# Patient Record
Sex: Male | Born: 1940 | Race: White | Hispanic: No | Marital: Married | Smoking: Never smoker
Health system: Southern US, Community
[De-identification: ages and names within clinical notes are randomized; demographics above are authoritative.]

## PROBLEM LIST (undated history)

## (undated) DIAGNOSIS — E782 Mixed hyperlipidemia: Secondary | ICD-10-CM

## (undated) DIAGNOSIS — R197 Diarrhea, unspecified: Secondary | ICD-10-CM

## (undated) DIAGNOSIS — E119 Type 2 diabetes mellitus without complications: Secondary | ICD-10-CM

## (undated) DIAGNOSIS — I639 Cerebral infarction, unspecified: Secondary | ICD-10-CM

## (undated) DIAGNOSIS — R233 Spontaneous ecchymoses: Secondary | ICD-10-CM

## (undated) DIAGNOSIS — H02409 Unspecified ptosis of unspecified eyelid: Secondary | ICD-10-CM

## (undated) DIAGNOSIS — Z8709 Personal history of other diseases of the respiratory system: Secondary | ICD-10-CM

## (undated) DIAGNOSIS — F40240 Claustrophobia: Secondary | ICD-10-CM

## (undated) DIAGNOSIS — K5790 Diverticulosis of intestine, part unspecified, without perforation or abscess without bleeding: Secondary | ICD-10-CM

## (undated) DIAGNOSIS — M1990 Unspecified osteoarthritis, unspecified site: Secondary | ICD-10-CM

## (undated) DIAGNOSIS — G8929 Other chronic pain: Secondary | ICD-10-CM

## (undated) DIAGNOSIS — Z9289 Personal history of other medical treatment: Secondary | ICD-10-CM

## (undated) DIAGNOSIS — K5730 Diverticulosis of large intestine without perforation or abscess without bleeding: Secondary | ICD-10-CM

## (undated) DIAGNOSIS — C9210 Chronic myeloid leukemia, BCR/ABL-positive, not having achieved remission: Secondary | ICD-10-CM

## (undated) DIAGNOSIS — R238 Other skin changes: Secondary | ICD-10-CM

## (undated) DIAGNOSIS — D497 Neoplasm of unspecified behavior of endocrine glands and other parts of nervous system: Secondary | ICD-10-CM

## (undated) DIAGNOSIS — Z87442 Personal history of urinary calculi: Secondary | ICD-10-CM

## (undated) DIAGNOSIS — R609 Edema, unspecified: Secondary | ICD-10-CM

## (undated) DIAGNOSIS — I2699 Other pulmonary embolism without acute cor pulmonale: Secondary | ICD-10-CM

## (undated) DIAGNOSIS — H409 Unspecified glaucoma: Secondary | ICD-10-CM

## (undated) DIAGNOSIS — K317 Polyp of stomach and duodenum: Secondary | ICD-10-CM

## (undated) DIAGNOSIS — R600 Localized edema: Secondary | ICD-10-CM

## (undated) DIAGNOSIS — Z860100 Personal history of colon polyps, unspecified: Secondary | ICD-10-CM

## (undated) DIAGNOSIS — M549 Dorsalgia, unspecified: Secondary | ICD-10-CM

## (undated) DIAGNOSIS — G4700 Insomnia, unspecified: Secondary | ICD-10-CM

## (undated) DIAGNOSIS — R112 Nausea with vomiting, unspecified: Secondary | ICD-10-CM

## (undated) DIAGNOSIS — I2510 Atherosclerotic heart disease of native coronary artery without angina pectoris: Secondary | ICD-10-CM

## (undated) DIAGNOSIS — I739 Peripheral vascular disease, unspecified: Secondary | ICD-10-CM

## (undated) DIAGNOSIS — K219 Gastro-esophageal reflux disease without esophagitis: Secondary | ICD-10-CM

## (undated) DIAGNOSIS — I779 Disorder of arteries and arterioles, unspecified: Secondary | ICD-10-CM

## (undated) DIAGNOSIS — G7000 Myasthenia gravis without (acute) exacerbation: Secondary | ICD-10-CM

## (undated) DIAGNOSIS — N189 Chronic kidney disease, unspecified: Secondary | ICD-10-CM

## (undated) DIAGNOSIS — Z9889 Other specified postprocedural states: Secondary | ICD-10-CM

## (undated) DIAGNOSIS — I82409 Acute embolism and thrombosis of unspecified deep veins of unspecified lower extremity: Secondary | ICD-10-CM

## (undated) DIAGNOSIS — K222 Esophageal obstruction: Secondary | ICD-10-CM

## (undated) DIAGNOSIS — I10 Essential (primary) hypertension: Secondary | ICD-10-CM

## (undated) DIAGNOSIS — J189 Pneumonia, unspecified organism: Secondary | ICD-10-CM

## (undated) DIAGNOSIS — M810 Age-related osteoporosis without current pathological fracture: Secondary | ICD-10-CM

## (undated) HISTORY — PX: COLONOSCOPY: SHX174

## (undated) HISTORY — DX: Chronic myeloid leukemia, BCR/ABL-positive, not having achieved remission: C92.10

## (undated) HISTORY — PX: BACK SURGERY: SHX140

## (undated) HISTORY — DX: Acute embolism and thrombosis of unspecified deep veins of unspecified lower extremity: I82.409

## (undated) HISTORY — PX: TONSILLECTOMY: SUR1361

## (undated) HISTORY — PX: LITHOTRIPSY: SUR834

## (undated) HISTORY — DX: Diverticulosis of large intestine without perforation or abscess without bleeding: K57.30

## (undated) HISTORY — DX: Type 2 diabetes mellitus without complications: E11.9

## (undated) HISTORY — DX: Age-related osteoporosis without current pathological fracture: M81.0

## (undated) HISTORY — DX: Mixed hyperlipidemia: E78.2

## (undated) HISTORY — DX: Atherosclerotic heart disease of native coronary artery without angina pectoris: I25.10

## (undated) HISTORY — DX: Cerebral infarction, unspecified: I63.9

## (undated) HISTORY — PX: CARDIAC CATHETERIZATION: SHX172

## (undated) HISTORY — DX: Unspecified osteoarthritis, unspecified site: M19.90

## (undated) HISTORY — DX: Myasthenia gravis without (acute) exacerbation: G70.00

## (undated) HISTORY — DX: Unspecified ptosis of unspecified eyelid: H02.409

## (undated) HISTORY — DX: Gastro-esophageal reflux disease without esophagitis: K21.9

---

## 1997-03-01 ENCOUNTER — Encounter: Payer: Self-pay | Admitting: Gastroenterology

## 1997-11-19 ENCOUNTER — Encounter: Payer: Self-pay | Admitting: Emergency Medicine

## 1997-11-19 ENCOUNTER — Emergency Department (HOSPITAL_COMMUNITY): Admission: EM | Admit: 1997-11-19 | Discharge: 1997-11-19 | Payer: Self-pay | Admitting: Emergency Medicine

## 1999-05-16 ENCOUNTER — Encounter: Payer: Self-pay | Admitting: Endocrinology

## 1999-05-16 ENCOUNTER — Encounter: Admission: RE | Admit: 1999-05-16 | Discharge: 1999-05-16 | Payer: Self-pay | Admitting: Endocrinology

## 2000-10-14 ENCOUNTER — Encounter: Payer: Self-pay | Admitting: Cardiology

## 2000-10-14 ENCOUNTER — Ambulatory Visit (HOSPITAL_COMMUNITY): Admission: RE | Admit: 2000-10-14 | Discharge: 2000-10-14 | Payer: Self-pay | Admitting: Cardiology

## 2001-03-16 ENCOUNTER — Inpatient Hospital Stay (HOSPITAL_COMMUNITY): Admission: EM | Admit: 2001-03-16 | Discharge: 2001-03-19 | Payer: Self-pay | Admitting: Cardiology

## 2001-03-16 ENCOUNTER — Encounter: Payer: Self-pay | Admitting: *Deleted

## 2003-03-28 ENCOUNTER — Encounter: Payer: Self-pay | Admitting: Gastroenterology

## 2003-04-13 ENCOUNTER — Encounter: Admission: RE | Admit: 2003-04-13 | Discharge: 2003-04-13 | Payer: Self-pay | Admitting: Endocrinology

## 2003-07-20 ENCOUNTER — Encounter: Admission: RE | Admit: 2003-07-20 | Discharge: 2003-07-20 | Payer: Self-pay | Admitting: Internal Medicine

## 2003-10-26 ENCOUNTER — Ambulatory Visit (HOSPITAL_COMMUNITY): Admission: RE | Admit: 2003-10-26 | Discharge: 2003-10-26 | Payer: Self-pay | Admitting: Gastroenterology

## 2003-10-26 ENCOUNTER — Encounter: Payer: Self-pay | Admitting: Gastroenterology

## 2003-12-18 ENCOUNTER — Ambulatory Visit: Payer: Self-pay | Admitting: Cardiology

## 2004-01-21 ENCOUNTER — Encounter: Admission: RE | Admit: 2004-01-21 | Discharge: 2004-01-21 | Payer: Self-pay | Admitting: Endocrinology

## 2004-01-23 ENCOUNTER — Ambulatory Visit: Payer: Self-pay | Admitting: Internal Medicine

## 2004-02-05 ENCOUNTER — Ambulatory Visit: Payer: Self-pay | Admitting: Endocrinology

## 2004-03-31 ENCOUNTER — Ambulatory Visit: Payer: Self-pay | Admitting: Oncology

## 2004-06-27 ENCOUNTER — Ambulatory Visit: Payer: Self-pay | Admitting: Oncology

## 2004-07-09 ENCOUNTER — Ambulatory Visit: Payer: Self-pay | Admitting: Endocrinology

## 2004-07-11 ENCOUNTER — Ambulatory Visit: Payer: Self-pay | Admitting: Cardiology

## 2004-07-16 ENCOUNTER — Ambulatory Visit: Payer: Self-pay | Admitting: Endocrinology

## 2004-07-25 ENCOUNTER — Ambulatory Visit: Payer: Self-pay | Admitting: Cardiology

## 2004-08-14 ENCOUNTER — Ambulatory Visit: Payer: Self-pay | Admitting: Cardiology

## 2004-10-02 ENCOUNTER — Ambulatory Visit: Payer: Self-pay | Admitting: Oncology

## 2004-10-03 ENCOUNTER — Ambulatory Visit: Payer: Self-pay | Admitting: Cardiology

## 2004-11-15 ENCOUNTER — Emergency Department (HOSPITAL_COMMUNITY): Admission: EM | Admit: 2004-11-15 | Discharge: 2004-11-16 | Payer: Self-pay | Admitting: Emergency Medicine

## 2004-12-04 ENCOUNTER — Ambulatory Visit: Payer: Self-pay | Admitting: Oncology

## 2004-12-05 ENCOUNTER — Ambulatory Visit: Payer: Self-pay | Admitting: Cardiology

## 2004-12-29 ENCOUNTER — Ambulatory Visit: Payer: Self-pay | Admitting: Cardiology

## 2005-02-04 ENCOUNTER — Ambulatory Visit: Payer: Self-pay | Admitting: *Deleted

## 2005-02-25 ENCOUNTER — Ambulatory Visit: Payer: Self-pay | Admitting: Oncology

## 2005-04-01 ENCOUNTER — Ambulatory Visit: Payer: Self-pay | Admitting: Cardiology

## 2005-04-23 ENCOUNTER — Ambulatory Visit: Payer: Self-pay | Admitting: Oncology

## 2005-06-01 ENCOUNTER — Ambulatory Visit: Payer: Self-pay | Admitting: Endocrinology

## 2005-06-02 ENCOUNTER — Ambulatory Visit: Payer: Self-pay | Admitting: Gastroenterology

## 2005-06-15 ENCOUNTER — Ambulatory Visit: Payer: Self-pay | Admitting: Endocrinology

## 2005-06-15 ENCOUNTER — Ambulatory Visit: Payer: Self-pay | Admitting: Cardiology

## 2005-06-18 ENCOUNTER — Encounter (INDEPENDENT_AMBULATORY_CARE_PROVIDER_SITE_OTHER): Payer: Self-pay | Admitting: *Deleted

## 2005-06-18 ENCOUNTER — Ambulatory Visit (HOSPITAL_COMMUNITY): Admission: RE | Admit: 2005-06-18 | Discharge: 2005-06-18 | Payer: Self-pay | Admitting: Endocrinology

## 2005-06-24 ENCOUNTER — Ambulatory Visit: Payer: Self-pay | Admitting: Gastroenterology

## 2005-06-24 ENCOUNTER — Ambulatory Visit: Payer: Self-pay | Admitting: Cardiology

## 2005-07-08 ENCOUNTER — Ambulatory Visit (HOSPITAL_COMMUNITY): Admission: RE | Admit: 2005-07-08 | Discharge: 2005-07-08 | Payer: Self-pay | Admitting: Gastroenterology

## 2005-07-08 ENCOUNTER — Ambulatory Visit: Payer: Self-pay | Admitting: Cardiology

## 2005-07-21 ENCOUNTER — Ambulatory Visit: Payer: Self-pay | Admitting: Gastroenterology

## 2005-11-11 ENCOUNTER — Ambulatory Visit: Payer: Self-pay | Admitting: Oncology

## 2005-11-13 LAB — CBC WITH DIFFERENTIAL/PLATELET
BASO%: 0.5 % (ref 0.0–2.0)
EOS%: 3.3 % (ref 0.0–7.0)
HCT: 38.2 % — ABNORMAL LOW (ref 38.7–49.9)
LYMPH%: 18.9 % (ref 14.0–48.0)
MCH: 32.4 pg (ref 28.0–33.4)
MCHC: 34.6 g/dL (ref 32.0–35.9)
MONO%: 12.4 % (ref 0.0–13.0)
NEUT%: 64.9 % (ref 40.0–75.0)
Platelets: 280 10*3/uL (ref 145–400)

## 2005-11-13 LAB — COMPREHENSIVE METABOLIC PANEL
ALT: 27 U/L (ref 0–40)
AST: 28 U/L (ref 0–37)
Creatinine, Ser: 1.5 mg/dL (ref 0.40–1.50)
Total Bilirubin: 0.7 mg/dL (ref 0.3–1.2)

## 2005-12-07 ENCOUNTER — Ambulatory Visit: Payer: Self-pay | Admitting: Endocrinology

## 2005-12-29 ENCOUNTER — Ambulatory Visit: Payer: Self-pay | Admitting: Endocrinology

## 2006-01-15 ENCOUNTER — Ambulatory Visit: Payer: Self-pay | Admitting: Endocrinology

## 2006-01-18 ENCOUNTER — Ambulatory Visit: Payer: Self-pay | Admitting: Cardiology

## 2006-03-09 ENCOUNTER — Ambulatory Visit: Payer: Self-pay | Admitting: Oncology

## 2006-03-12 LAB — COMPREHENSIVE METABOLIC PANEL
ALT: 22 U/L (ref 0–53)
BUN: 13 mg/dL (ref 6–23)
CO2: 26 mEq/L (ref 19–32)
Creatinine, Ser: 1.51 mg/dL — ABNORMAL HIGH (ref 0.40–1.50)
Total Bilirubin: 0.7 mg/dL (ref 0.3–1.2)

## 2006-03-12 LAB — CBC WITH DIFFERENTIAL/PLATELET
BASO%: 0.4 % (ref 0.0–2.0)
Basophils Absolute: 0 10*3/uL (ref 0.0–0.1)
EOS%: 5.9 % (ref 0.0–7.0)
HCT: 36.2 % — ABNORMAL LOW (ref 38.7–49.9)
LYMPH%: 22.5 % (ref 14.0–48.0)
MCH: 31.7 pg (ref 28.0–33.4)
MCHC: 34.8 g/dL (ref 32.0–35.9)
MONO#: 0.6 10*3/uL (ref 0.1–0.9)
NEUT%: 56.6 % (ref 40.0–75.0)
Platelets: 253 10*3/uL (ref 145–400)

## 2006-06-29 ENCOUNTER — Emergency Department (HOSPITAL_COMMUNITY): Admission: EM | Admit: 2006-06-29 | Discharge: 2006-06-29 | Payer: Self-pay | Admitting: Emergency Medicine

## 2006-07-07 ENCOUNTER — Ambulatory Visit: Payer: Self-pay | Admitting: Oncology

## 2006-07-09 LAB — CBC WITH DIFFERENTIAL/PLATELET
Basophils Absolute: 0 10*3/uL (ref 0.0–0.1)
Eosinophils Absolute: 0.2 10*3/uL (ref 0.0–0.5)
HCT: 36.8 % — ABNORMAL LOW (ref 38.7–49.9)
HGB: 12.8 g/dL — ABNORMAL LOW (ref 13.0–17.1)
LYMPH%: 21.1 % (ref 14.0–48.0)
MCHC: 34.9 g/dL (ref 32.0–35.9)
MONO#: 0.5 10*3/uL (ref 0.1–0.9)
NEUT#: 2.6 10*3/uL (ref 1.5–6.5)
NEUT%: 59.9 % (ref 40.0–75.0)
Platelets: 256 10*3/uL (ref 145–400)
WBC: 4.3 10*3/uL (ref 4.0–10.0)

## 2006-07-12 ENCOUNTER — Ambulatory Visit: Payer: Self-pay | Admitting: Gastroenterology

## 2006-07-19 ENCOUNTER — Ambulatory Visit: Payer: Self-pay | Admitting: Cardiology

## 2006-08-10 ENCOUNTER — Ambulatory Visit: Payer: Self-pay | Admitting: Internal Medicine

## 2006-09-16 ENCOUNTER — Ambulatory Visit: Payer: Self-pay | Admitting: Oncology

## 2006-09-20 LAB — COMPREHENSIVE METABOLIC PANEL
ALT: 21 U/L (ref 0–53)
AST: 25 U/L (ref 0–37)
BUN: 14 mg/dL (ref 6–23)
Calcium: 9.5 mg/dL (ref 8.4–10.5)
Creatinine, Ser: 1.55 mg/dL — ABNORMAL HIGH (ref 0.40–1.50)
Total Bilirubin: 0.7 mg/dL (ref 0.3–1.2)

## 2006-09-20 LAB — CBC WITH DIFFERENTIAL/PLATELET
BASO%: 0.8 % (ref 0.0–2.0)
Basophils Absolute: 0 10*3/uL (ref 0.0–0.1)
EOS%: 4.8 % (ref 0.0–7.0)
HCT: 36.1 % — ABNORMAL LOW (ref 38.7–49.9)
HGB: 12.8 g/dL — ABNORMAL LOW (ref 13.0–17.1)
LYMPH%: 19.5 % (ref 14.0–48.0)
MCH: 31.9 pg (ref 28.0–33.4)
MCHC: 35.5 g/dL (ref 32.0–35.9)
MCV: 89.9 fL (ref 81.6–98.0)
NEUT%: 61.4 % (ref 40.0–75.0)
Platelets: 236 10*3/uL (ref 145–400)

## 2006-12-02 ENCOUNTER — Telehealth (INDEPENDENT_AMBULATORY_CARE_PROVIDER_SITE_OTHER): Payer: Self-pay | Admitting: *Deleted

## 2006-12-23 ENCOUNTER — Ambulatory Visit: Payer: Self-pay | Admitting: Oncology

## 2006-12-27 ENCOUNTER — Encounter: Payer: Self-pay | Admitting: Endocrinology

## 2006-12-27 LAB — CBC WITH DIFFERENTIAL/PLATELET
BASO%: 1.5 % (ref 0.0–2.0)
Basophils Absolute: 0.1 10*3/uL (ref 0.0–0.1)
HCT: 35.9 % — ABNORMAL LOW (ref 38.7–49.9)
HGB: 12.7 g/dL — ABNORMAL LOW (ref 13.0–17.1)
LYMPH%: 21.3 % (ref 14.0–48.0)
MCH: 32.1 pg (ref 28.0–33.4)
MCHC: 35.4 g/dL (ref 32.0–35.9)
MONO#: 0.6 10*3/uL (ref 0.1–0.9)
NEUT%: 56 % (ref 40.0–75.0)
Platelets: 229 10*3/uL (ref 145–400)
WBC: 4.2 10*3/uL (ref 4.0–10.0)
lymph#: 0.9 10*3/uL (ref 0.9–3.3)

## 2006-12-27 LAB — COMPREHENSIVE METABOLIC PANEL
BUN: 10 mg/dL (ref 6–23)
CO2: 27 mEq/L (ref 19–32)
Calcium: 9.4 mg/dL (ref 8.4–10.5)
Chloride: 106 mEq/L (ref 96–112)
Creatinine, Ser: 1.49 mg/dL (ref 0.40–1.50)
Glucose, Bld: 85 mg/dL (ref 70–99)
Total Bilirubin: 0.6 mg/dL (ref 0.3–1.2)

## 2007-01-17 ENCOUNTER — Ambulatory Visit: Payer: Self-pay | Admitting: Cardiology

## 2007-01-28 ENCOUNTER — Encounter: Payer: Self-pay | Admitting: Endocrinology

## 2007-02-01 ENCOUNTER — Ambulatory Visit: Payer: Self-pay | Admitting: Cardiology

## 2007-02-01 LAB — CONVERTED CEMR LAB
ALT: 25 units/L (ref 0–53)
AST: 25 units/L (ref 0–37)
Albumin: 3.8 g/dL (ref 3.5–5.2)
Alkaline Phosphatase: 36 units/L — ABNORMAL LOW (ref 39–117)
Bilirubin, Direct: 0.2 mg/dL (ref 0.0–0.3)
Cholesterol: 113 mg/dL (ref 0–200)
HDL: 25.2 mg/dL — ABNORMAL LOW (ref >39.0)
LDL Cholesterol: 67 mg/dL (ref 0–99)
Total Bilirubin: 0.9 mg/dL (ref 0.3–1.2)
Total CHOL/HDL Ratio: 4.5
Total Protein: 6.1 g/dL (ref 6.0–8.3)
Triglycerides: 102 mg/dL (ref 0–149)
VLDL: 20 mg/dL (ref 0–40)

## 2007-02-04 ENCOUNTER — Encounter: Payer: Self-pay | Admitting: Endocrinology

## 2007-03-18 ENCOUNTER — Encounter: Payer: Self-pay | Admitting: Endocrinology

## 2007-03-18 ENCOUNTER — Encounter: Payer: Self-pay | Admitting: Internal Medicine

## 2007-03-24 ENCOUNTER — Ambulatory Visit: Payer: Self-pay | Admitting: Oncology

## 2007-03-28 ENCOUNTER — Encounter: Payer: Self-pay | Admitting: Endocrinology

## 2007-03-28 LAB — CBC WITH DIFFERENTIAL/PLATELET
BASO%: 0.3 % (ref 0.0–2.0)
Eosinophils Absolute: 0.3 10*3/uL (ref 0.0–0.5)
HCT: 37.2 % — ABNORMAL LOW (ref 38.7–49.9)
LYMPH%: 12.6 % — ABNORMAL LOW (ref 14.0–48.0)
MCHC: 34.7 g/dL (ref 32.0–35.9)
MONO#: 0.8 10*3/uL (ref 0.1–0.9)
NEUT%: 71.7 % (ref 40.0–75.0)
Platelets: 244 10*3/uL (ref 145–400)
WBC: 7.3 10*3/uL (ref 4.0–10.0)

## 2007-05-13 ENCOUNTER — Encounter: Payer: Self-pay | Admitting: Endocrinology

## 2007-05-25 ENCOUNTER — Encounter: Payer: Self-pay | Admitting: Internal Medicine

## 2007-05-26 ENCOUNTER — Encounter: Payer: Self-pay | Admitting: Internal Medicine

## 2007-07-07 DIAGNOSIS — C9211 Chronic myeloid leukemia, BCR/ABL-positive, in remission: Secondary | ICD-10-CM

## 2007-07-07 DIAGNOSIS — Z87442 Personal history of urinary calculi: Secondary | ICD-10-CM

## 2007-07-07 DIAGNOSIS — I2510 Atherosclerotic heart disease of native coronary artery without angina pectoris: Secondary | ICD-10-CM

## 2007-07-14 ENCOUNTER — Ambulatory Visit: Payer: Self-pay | Admitting: Oncology

## 2007-07-18 ENCOUNTER — Encounter: Payer: Self-pay | Admitting: Endocrinology

## 2007-07-18 LAB — CBC WITH DIFFERENTIAL/PLATELET
Basophils Absolute: 0 10*3/uL (ref 0.0–0.1)
Eosinophils Absolute: 0.3 10*3/uL (ref 0.0–0.5)
HCT: 36 % — ABNORMAL LOW (ref 38.7–49.9)
HGB: 12.6 g/dL — ABNORMAL LOW (ref 13.0–17.1)
LYMPH%: 20.9 % (ref 14.0–48.0)
MONO#: 0.6 10*3/uL (ref 0.1–0.9)
NEUT#: 2.7 10*3/uL (ref 1.5–6.5)
Platelets: 275 10*3/uL (ref 145–400)
RBC: 3.96 10*6/uL — ABNORMAL LOW (ref 4.20–5.71)
WBC: 4.7 10*3/uL (ref 4.0–10.0)

## 2007-08-23 ENCOUNTER — Ambulatory Visit: Payer: Self-pay | Admitting: Cardiology

## 2007-08-23 LAB — CONVERTED CEMR LAB
ALT: 24 units/L (ref 0–53)
AST: 28 units/L (ref 0–37)
Albumin: 4.1 g/dL (ref 3.5–5.2)
Alkaline Phosphatase: 38 units/L — ABNORMAL LOW (ref 39–117)
BUN: 14 mg/dL (ref 6–23)
Bilirubin, Direct: 0.1 mg/dL (ref 0.0–0.3)
CO2: 28 meq/L (ref 19–32)
Calcium: 9.1 mg/dL (ref 8.4–10.5)
Chloride: 105 meq/L (ref 96–112)
Cholesterol: 112 mg/dL (ref 0–200)
Creatinine, Ser: 1.6 mg/dL — ABNORMAL HIGH (ref 0.4–1.5)
GFR calc Af Amer: 55 mL/min
GFR calc non Af Amer: 46 mL/min
Glucose, Bld: 104 mg/dL — ABNORMAL HIGH (ref 70–99)
HDL: 26.8 mg/dL — ABNORMAL LOW (ref >39.0)
LDL Cholesterol: 68 mg/dL (ref 0–99)
Potassium: 3.8 meq/L (ref 3.5–5.1)
Sodium: 139 meq/L (ref 135–145)
Total Bilirubin: 1.1 mg/dL (ref 0.3–1.2)
Total CHOL/HDL Ratio: 4.2
Total Protein: 6 g/dL (ref 6.0–8.3)
Triglycerides: 85 mg/dL (ref 0–149)
VLDL: 17 mg/dL (ref 0–40)

## 2007-08-25 ENCOUNTER — Ambulatory Visit: Payer: Self-pay | Admitting: Cardiology

## 2007-08-29 ENCOUNTER — Ambulatory Visit: Payer: Self-pay | Admitting: Endocrinology

## 2007-08-29 DIAGNOSIS — H109 Unspecified conjunctivitis: Secondary | ICD-10-CM | POA: Insufficient documentation

## 2007-09-23 ENCOUNTER — Encounter: Payer: Self-pay | Admitting: Endocrinology

## 2007-09-23 ENCOUNTER — Encounter: Payer: Self-pay | Admitting: Internal Medicine

## 2007-11-10 ENCOUNTER — Ambulatory Visit: Payer: Self-pay | Admitting: Oncology

## 2007-11-14 ENCOUNTER — Encounter: Payer: Self-pay | Admitting: Endocrinology

## 2007-11-14 LAB — CBC WITH DIFFERENTIAL/PLATELET
Eosinophils Absolute: 0.3 10*3/uL (ref 0.0–0.5)
HCT: 36.1 % — ABNORMAL LOW (ref 38.7–49.9)
LYMPH%: 16.2 % (ref 14.0–48.0)
MCV: 91.4 fL (ref 81.6–98.0)
MONO#: 0.6 10*3/uL (ref 0.1–0.9)
MONO%: 10.5 % (ref 0.0–13.0)
NEUT#: 3.6 10*3/uL (ref 1.5–6.5)
NEUT%: 67.6 % (ref 40.0–75.0)
Platelets: 194 10*3/uL (ref 145–400)
WBC: 5.4 10*3/uL (ref 4.0–10.0)

## 2007-11-14 LAB — COMPREHENSIVE METABOLIC PANEL
BUN: 8 mg/dL (ref 6–23)
CO2: 29 mEq/L (ref 19–32)
Creatinine, Ser: 1.24 mg/dL (ref 0.40–1.50)
Glucose, Bld: 143 mg/dL — ABNORMAL HIGH (ref 70–99)
Total Bilirubin: 0.7 mg/dL (ref 0.3–1.2)

## 2007-12-08 ENCOUNTER — Ambulatory Visit: Payer: Self-pay | Admitting: Endocrinology

## 2007-12-08 DIAGNOSIS — J189 Pneumonia, unspecified organism: Secondary | ICD-10-CM

## 2007-12-29 ENCOUNTER — Ambulatory Visit: Payer: Self-pay | Admitting: Endocrinology

## 2007-12-30 ENCOUNTER — Encounter: Payer: Self-pay | Admitting: Endocrinology

## 2008-02-07 ENCOUNTER — Ambulatory Visit: Payer: Self-pay | Admitting: Internal Medicine

## 2008-02-07 DIAGNOSIS — R21 Rash and other nonspecific skin eruption: Secondary | ICD-10-CM

## 2008-02-08 ENCOUNTER — Encounter: Payer: Self-pay | Admitting: Endocrinology

## 2008-02-08 LAB — CONVERTED CEMR LAB
ALT: 28 units/L (ref 0–53)
AST: 30 units/L (ref 0–37)
Albumin: 4 g/dL (ref 3.5–5.2)
Alkaline Phosphatase: 68 units/L (ref 39–117)
Basophils Absolute: 0 10*3/uL (ref 0.0–0.1)
Basophils Relative: 0.7 % (ref 0.0–3.0)
Bilirubin, Direct: 0.2 mg/dL (ref 0.0–0.3)
Eosinophils Absolute: 0.5 10*3/uL (ref 0.0–0.7)
Eosinophils Relative: 6.7 % — ABNORMAL HIGH (ref 0.0–5.0)
HCT: 42.9 % (ref 39.0–52.0)
Hemoglobin: 14.9 g/dL (ref 13.0–17.0)
Lymphocytes Relative: 20.6 % (ref 12.0–46.0)
MCHC: 34.8 g/dL (ref 30.0–36.0)
MCV: 92.9 fL (ref 78.0–100.0)
Monocytes Absolute: 1 10*3/uL (ref 0.1–1.0)
Monocytes Relative: 14.7 % — ABNORMAL HIGH (ref 3.0–12.0)
Neutro Abs: 4 10*3/uL (ref 1.4–7.7)
Neutrophils Relative %: 57.3 % (ref 43.0–77.0)
Platelets: 222 10*3/uL (ref 150–400)
RBC: 4.62 M/uL (ref 4.22–5.81)
RDW: 13.1 % (ref 11.5–14.6)
Total Bilirubin: 0.9 mg/dL (ref 0.3–1.2)
Total Protein: 6.7 g/dL (ref 6.0–8.3)
WBC: 6.9 10*3/uL (ref 4.5–10.5)

## 2008-02-28 ENCOUNTER — Encounter: Payer: Self-pay | Admitting: Endocrinology

## 2008-03-13 ENCOUNTER — Ambulatory Visit: Payer: Self-pay | Admitting: Oncology

## 2008-03-30 ENCOUNTER — Encounter: Payer: Self-pay | Admitting: Endocrinology

## 2008-04-02 ENCOUNTER — Ambulatory Visit: Payer: Self-pay | Admitting: Cardiology

## 2008-05-17 ENCOUNTER — Ambulatory Visit: Payer: Self-pay | Admitting: Oncology

## 2008-05-21 ENCOUNTER — Encounter: Payer: Self-pay | Admitting: Endocrinology

## 2008-05-21 LAB — CBC WITH DIFFERENTIAL/PLATELET
Basophils Absolute: 0 10*3/uL (ref 0.0–0.1)
Eosinophils Absolute: 0.3 10*3/uL (ref 0.0–0.5)
HGB: 13.7 g/dL (ref 13.0–17.1)
LYMPH%: 19.8 % (ref 14.0–49.0)
MCV: 92.5 fL (ref 79.3–98.0)
MONO#: 0.9 10*3/uL (ref 0.1–0.9)
NEUT#: 4 10*3/uL (ref 1.5–6.5)
Platelets: 208 10*3/uL (ref 140–400)
RBC: 4.35 10*6/uL (ref 4.20–5.82)
RDW: 13.6 % (ref 11.0–14.6)
WBC: 6.5 10*3/uL (ref 4.0–10.3)

## 2008-05-30 ENCOUNTER — Encounter: Payer: Self-pay | Admitting: Gastroenterology

## 2008-05-30 ENCOUNTER — Encounter: Payer: Self-pay | Admitting: Physician Assistant

## 2008-06-04 ENCOUNTER — Encounter: Payer: Self-pay | Admitting: Gastroenterology

## 2008-06-22 ENCOUNTER — Telehealth: Payer: Self-pay | Admitting: Gastroenterology

## 2008-06-26 ENCOUNTER — Telehealth: Payer: Self-pay | Admitting: Gastroenterology

## 2008-06-29 ENCOUNTER — Encounter: Payer: Self-pay | Admitting: Endocrinology

## 2008-06-29 ENCOUNTER — Telehealth: Payer: Self-pay | Admitting: Gastroenterology

## 2008-07-02 ENCOUNTER — Ambulatory Visit: Payer: Self-pay | Admitting: Internal Medicine

## 2008-07-02 DIAGNOSIS — Z860100 Personal history of colon polyps, unspecified: Secondary | ICD-10-CM | POA: Insufficient documentation

## 2008-07-02 DIAGNOSIS — K5730 Diverticulosis of large intestine without perforation or abscess without bleeding: Secondary | ICD-10-CM | POA: Insufficient documentation

## 2008-07-02 DIAGNOSIS — K648 Other hemorrhoids: Secondary | ICD-10-CM | POA: Insufficient documentation

## 2008-07-02 DIAGNOSIS — K644 Residual hemorrhoidal skin tags: Secondary | ICD-10-CM | POA: Insufficient documentation

## 2008-07-03 ENCOUNTER — Telehealth: Payer: Self-pay | Admitting: Physician Assistant

## 2008-08-17 ENCOUNTER — Encounter: Payer: Self-pay | Admitting: Cardiology

## 2008-09-07 ENCOUNTER — Telehealth: Payer: Self-pay | Admitting: Gastroenterology

## 2008-09-11 ENCOUNTER — Ambulatory Visit: Payer: Self-pay | Admitting: Gastroenterology

## 2008-09-11 ENCOUNTER — Encounter: Payer: Self-pay | Admitting: Gastroenterology

## 2008-09-11 DIAGNOSIS — R109 Unspecified abdominal pain: Secondary | ICD-10-CM

## 2008-09-11 DIAGNOSIS — K6289 Other specified diseases of anus and rectum: Secondary | ICD-10-CM | POA: Insufficient documentation

## 2008-09-14 ENCOUNTER — Telehealth: Payer: Self-pay | Admitting: Cardiology

## 2008-09-21 ENCOUNTER — Ambulatory Visit: Payer: Self-pay | Admitting: Gastroenterology

## 2008-09-21 ENCOUNTER — Encounter: Payer: Self-pay | Admitting: Gastroenterology

## 2008-09-21 LAB — HM COLONOSCOPY

## 2008-09-24 ENCOUNTER — Encounter: Payer: Self-pay | Admitting: Gastroenterology

## 2008-10-05 DIAGNOSIS — E782 Mixed hyperlipidemia: Secondary | ICD-10-CM | POA: Insufficient documentation

## 2008-10-08 ENCOUNTER — Ambulatory Visit: Payer: Self-pay | Admitting: Cardiology

## 2008-10-18 ENCOUNTER — Ambulatory Visit: Payer: Self-pay | Admitting: Oncology

## 2008-10-22 ENCOUNTER — Encounter: Payer: Self-pay | Admitting: Endocrinology

## 2008-10-22 LAB — CBC WITH DIFFERENTIAL/PLATELET
Basophils Absolute: 0 10*3/uL (ref 0.0–0.1)
Eosinophils Absolute: 0.2 10*3/uL (ref 0.0–0.5)
HCT: 39.1 % (ref 38.4–49.9)
HGB: 13.7 g/dL (ref 13.0–17.1)
LYMPH%: 16.8 % (ref 14.0–49.0)
MCV: 91.6 fL (ref 79.3–98.0)
MONO#: 0.6 10*3/uL (ref 0.1–0.9)
MONO%: 10.3 % (ref 0.0–14.0)
NEUT#: 4 10*3/uL (ref 1.5–6.5)
NEUT%: 68.7 % (ref 39.0–75.0)
Platelets: 192 10*3/uL (ref 140–400)
RBC: 4.27 10*6/uL (ref 4.20–5.82)
WBC: 5.8 10*3/uL (ref 4.0–10.3)

## 2008-10-22 LAB — COMPREHENSIVE METABOLIC PANEL
Alkaline Phosphatase: 62 U/L (ref 39–117)
BUN: 12 mg/dL (ref 6–23)
CO2: 26 mEq/L (ref 19–32)
Glucose, Bld: 118 mg/dL — ABNORMAL HIGH (ref 70–99)
Sodium: 141 mEq/L (ref 135–145)
Total Bilirubin: 0.8 mg/dL (ref 0.3–1.2)
Total Protein: 6.2 g/dL (ref 6.0–8.3)

## 2008-11-26 ENCOUNTER — Encounter: Payer: Self-pay | Admitting: Endocrinology

## 2008-11-26 ENCOUNTER — Ambulatory Visit: Payer: Self-pay | Admitting: Oncology

## 2008-12-28 ENCOUNTER — Encounter: Payer: Self-pay | Admitting: Cardiology

## 2009-01-28 ENCOUNTER — Encounter (INDEPENDENT_AMBULATORY_CARE_PROVIDER_SITE_OTHER): Payer: Self-pay | Admitting: *Deleted

## 2009-01-29 ENCOUNTER — Ambulatory Visit: Payer: Self-pay | Admitting: Endocrinology

## 2009-01-29 DIAGNOSIS — M79609 Pain in unspecified limb: Secondary | ICD-10-CM | POA: Insufficient documentation

## 2009-01-29 DIAGNOSIS — M25539 Pain in unspecified wrist: Secondary | ICD-10-CM | POA: Insufficient documentation

## 2009-02-06 ENCOUNTER — Telehealth: Payer: Self-pay | Admitting: Endocrinology

## 2009-02-18 ENCOUNTER — Encounter: Payer: Self-pay | Admitting: Endocrinology

## 2009-03-25 ENCOUNTER — Encounter: Payer: Self-pay | Admitting: Endocrinology

## 2009-03-29 ENCOUNTER — Encounter: Payer: Self-pay | Admitting: Endocrinology

## 2009-04-03 ENCOUNTER — Encounter: Payer: Self-pay | Admitting: Endocrinology

## 2009-04-11 ENCOUNTER — Ambulatory Visit: Payer: Self-pay | Admitting: Oncology

## 2009-04-15 ENCOUNTER — Encounter: Payer: Self-pay | Admitting: Endocrinology

## 2009-04-15 LAB — CBC WITH DIFFERENTIAL/PLATELET
EOS%: 4.3 % (ref 0.0–7.0)
Eosinophils Absolute: 0.2 10*3/uL (ref 0.0–0.5)
LYMPH%: 19.4 % (ref 14.0–49.0)
MCH: 32.3 pg (ref 27.2–33.4)
MCV: 93.7 fL (ref 79.3–98.0)
MONO%: 9.9 % (ref 0.0–14.0)
Platelets: 189 10*3/uL (ref 140–400)
RBC: 4.3 10*6/uL (ref 4.20–5.82)
RDW: 14.1 % (ref 11.0–14.6)

## 2009-04-15 LAB — COMPREHENSIVE METABOLIC PANEL
AST: 20 U/L (ref 0–37)
Albumin: 4.2 g/dL (ref 3.5–5.2)
Alkaline Phosphatase: 60 U/L (ref 39–117)
BUN: 12 mg/dL (ref 6–23)
Glucose, Bld: 123 mg/dL — ABNORMAL HIGH (ref 70–99)
Potassium: 3.7 mEq/L (ref 3.5–5.3)
Sodium: 142 mEq/L (ref 135–145)
Total Bilirubin: 0.8 mg/dL (ref 0.3–1.2)

## 2009-04-16 ENCOUNTER — Encounter: Admission: RE | Admit: 2009-04-16 | Discharge: 2009-04-16 | Payer: Self-pay | Admitting: Rheumatology

## 2009-04-22 ENCOUNTER — Telehealth: Payer: Self-pay | Admitting: Cardiology

## 2009-04-24 ENCOUNTER — Telehealth (INDEPENDENT_AMBULATORY_CARE_PROVIDER_SITE_OTHER): Payer: Self-pay | Admitting: *Deleted

## 2009-04-24 ENCOUNTER — Ambulatory Visit: Payer: Self-pay | Admitting: Endocrinology

## 2009-04-24 DIAGNOSIS — I10 Essential (primary) hypertension: Secondary | ICD-10-CM | POA: Insufficient documentation

## 2009-04-29 ENCOUNTER — Telehealth (INDEPENDENT_AMBULATORY_CARE_PROVIDER_SITE_OTHER): Payer: Self-pay | Admitting: *Deleted

## 2009-04-30 ENCOUNTER — Telehealth: Payer: Self-pay | Admitting: Endocrinology

## 2009-05-02 ENCOUNTER — Telehealth (INDEPENDENT_AMBULATORY_CARE_PROVIDER_SITE_OTHER): Payer: Self-pay | Admitting: *Deleted

## 2009-05-02 ENCOUNTER — Ambulatory Visit: Payer: Self-pay | Admitting: Cardiology

## 2009-05-06 ENCOUNTER — Ambulatory Visit: Payer: Self-pay | Admitting: Internal Medicine

## 2009-05-06 ENCOUNTER — Ambulatory Visit: Payer: Self-pay

## 2009-05-06 ENCOUNTER — Encounter (HOSPITAL_COMMUNITY)
Admission: RE | Admit: 2009-05-06 | Discharge: 2009-07-10 | Payer: Self-pay | Source: Home / Self Care | Admitting: Cardiology

## 2009-05-06 ENCOUNTER — Encounter: Payer: Self-pay | Admitting: Cardiology

## 2009-05-07 ENCOUNTER — Ambulatory Visit (HOSPITAL_COMMUNITY): Admission: RE | Admit: 2009-05-07 | Discharge: 2009-05-08 | Payer: Self-pay | Admitting: Orthopedic Surgery

## 2009-05-07 ENCOUNTER — Telehealth: Payer: Self-pay | Admitting: Internal Medicine

## 2009-05-07 HISTORY — PX: SHOULDER SURGERY: SHX246

## 2009-05-16 ENCOUNTER — Telehealth (INDEPENDENT_AMBULATORY_CARE_PROVIDER_SITE_OTHER): Payer: Self-pay | Admitting: *Deleted

## 2009-05-24 ENCOUNTER — Ambulatory Visit: Payer: Self-pay | Admitting: Endocrinology

## 2009-05-24 DIAGNOSIS — J069 Acute upper respiratory infection, unspecified: Secondary | ICD-10-CM | POA: Insufficient documentation

## 2009-05-24 LAB — CONVERTED CEMR LAB: Rapid Strep: NEGATIVE

## 2009-06-05 ENCOUNTER — Telehealth: Payer: Self-pay | Admitting: Endocrinology

## 2009-06-07 ENCOUNTER — Telehealth: Payer: Self-pay | Admitting: Nurse Practitioner

## 2009-06-28 ENCOUNTER — Encounter: Payer: Self-pay | Admitting: Endocrinology

## 2009-07-30 ENCOUNTER — Telehealth: Payer: Self-pay | Admitting: Gastroenterology

## 2009-07-31 ENCOUNTER — Ambulatory Visit (HOSPITAL_COMMUNITY): Admission: RE | Admit: 2009-07-31 | Discharge: 2009-07-31 | Payer: Self-pay | Admitting: Gastroenterology

## 2009-07-31 ENCOUNTER — Ambulatory Visit: Payer: Self-pay | Admitting: Gastroenterology

## 2009-07-31 LAB — CONVERTED CEMR LAB
ALT: 25 units/L (ref 0–53)
AST: 26 units/L (ref 0–37)
Albumin: 4.3 g/dL (ref 3.5–5.2)
Alkaline Phosphatase: 60 units/L (ref 39–117)
Amylase: 28 units/L (ref 27–131)
BUN: 10 mg/dL (ref 6–23)
Basophils Absolute: 0 10*3/uL (ref 0.0–0.1)
Basophils Relative: 0.4 % (ref 0.0–3.0)
CO2: 30 meq/L (ref 19–32)
Calcium: 9.1 mg/dL (ref 8.4–10.5)
Chloride: 105 meq/L (ref 96–112)
Creatinine, Ser: 1.2 mg/dL (ref 0.4–1.5)
Eosinophils Absolute: 0.3 10*3/uL (ref 0.0–0.7)
Eosinophils Relative: 4.6 % (ref 0.0–5.0)
GFR calc non Af Amer: 63.39 mL/min (ref >60)
Glucose, Bld: 100 mg/dL — ABNORMAL HIGH (ref 70–99)
HCT: 38.8 % — ABNORMAL LOW (ref 39.0–52.0)
Hemoglobin: 13.2 g/dL (ref 13.0–17.0)
Lipase: 26 units/L (ref 11.0–59.0)
Lymphocytes Relative: 20.1 % (ref 12.0–46.0)
Lymphs Abs: 1.2 10*3/uL (ref 0.7–4.0)
MCHC: 34.1 g/dL (ref 30.0–36.0)
MCV: 93.9 fL (ref 78.0–100.0)
Monocytes Absolute: 0.7 10*3/uL (ref 0.1–1.0)
Monocytes Relative: 11.9 % (ref 3.0–12.0)
Neutro Abs: 3.8 10*3/uL (ref 1.4–7.7)
Neutrophils Relative %: 63 % (ref 43.0–77.0)
Platelets: 210 10*3/uL (ref 150.0–400.0)
Potassium: 4.5 meq/L (ref 3.5–5.1)
RBC: 4.13 M/uL — ABNORMAL LOW (ref 4.22–5.81)
RDW: 14.5 % (ref 11.5–14.6)
Sodium: 142 meq/L (ref 135–145)
Total Bilirubin: 1.3 mg/dL — ABNORMAL HIGH (ref 0.3–1.2)
Total Protein: 6.6 g/dL (ref 6.0–8.3)
WBC: 6 10*3/uL (ref 4.5–10.5)

## 2009-08-01 ENCOUNTER — Telehealth: Payer: Self-pay | Admitting: Cardiology

## 2009-08-01 ENCOUNTER — Encounter: Payer: Self-pay | Admitting: Gastroenterology

## 2009-08-01 ENCOUNTER — Ambulatory Visit: Payer: Self-pay | Admitting: Nurse Practitioner

## 2009-08-01 DIAGNOSIS — R197 Diarrhea, unspecified: Secondary | ICD-10-CM

## 2009-08-01 DIAGNOSIS — R112 Nausea with vomiting, unspecified: Secondary | ICD-10-CM

## 2009-08-01 DIAGNOSIS — R6881 Early satiety: Secondary | ICD-10-CM

## 2009-08-01 DIAGNOSIS — R1013 Epigastric pain: Secondary | ICD-10-CM

## 2009-08-08 ENCOUNTER — Ambulatory Visit: Payer: Self-pay | Admitting: Gastroenterology

## 2009-08-09 ENCOUNTER — Telehealth: Payer: Self-pay | Admitting: Gastroenterology

## 2009-08-09 ENCOUNTER — Encounter (INDEPENDENT_AMBULATORY_CARE_PROVIDER_SITE_OTHER): Payer: Self-pay | Admitting: *Deleted

## 2009-08-13 ENCOUNTER — Telehealth: Payer: Self-pay | Admitting: Gastroenterology

## 2009-08-15 ENCOUNTER — Encounter: Payer: Self-pay | Admitting: Gastroenterology

## 2009-09-10 ENCOUNTER — Ambulatory Visit: Payer: Self-pay | Admitting: Gastroenterology

## 2009-09-20 ENCOUNTER — Encounter: Payer: Self-pay | Admitting: Endocrinology

## 2009-10-16 ENCOUNTER — Ambulatory Visit: Payer: Self-pay | Admitting: Oncology

## 2009-10-18 ENCOUNTER — Encounter: Payer: Self-pay | Admitting: Endocrinology

## 2009-10-18 LAB — COMPREHENSIVE METABOLIC PANEL
ALT: 20 U/L (ref 0–53)
AST: 24 U/L (ref 0–37)
Albumin: 4.2 g/dL (ref 3.5–5.2)
Alkaline Phosphatase: 60 U/L (ref 39–117)
Potassium: 4.1 mEq/L (ref 3.5–5.3)
Sodium: 140 mEq/L (ref 135–145)
Total Bilirubin: 0.8 mg/dL (ref 0.3–1.2)
Total Protein: 6 g/dL (ref 6.0–8.3)

## 2009-10-18 LAB — CBC WITH DIFFERENTIAL/PLATELET
BASO%: 0.5 % (ref 0.0–2.0)
EOS%: 3.1 % (ref 0.0–7.0)
Eosinophils Absolute: 0.2 10*3/uL (ref 0.0–0.5)
LYMPH%: 17.6 % (ref 14.0–49.0)
MCH: 31.3 pg (ref 27.2–33.4)
MCHC: 34.2 g/dL (ref 32.0–36.0)
MCV: 91.6 fL (ref 79.3–98.0)
MONO%: 12.8 % (ref 0.0–14.0)
NEUT#: 4.3 10*3/uL (ref 1.5–6.5)
RBC: 4.38 10*6/uL (ref 4.20–5.82)
RDW: 13.7 % (ref 11.0–14.6)

## 2009-10-21 ENCOUNTER — Ambulatory Visit: Payer: Self-pay | Admitting: Cardiology

## 2009-12-30 ENCOUNTER — Ambulatory Visit: Payer: Self-pay | Admitting: Endocrinology

## 2009-12-30 DIAGNOSIS — H02409 Unspecified ptosis of unspecified eyelid: Secondary | ICD-10-CM

## 2009-12-30 DIAGNOSIS — M542 Cervicalgia: Secondary | ICD-10-CM | POA: Insufficient documentation

## 2010-01-17 ENCOUNTER — Encounter: Payer: Self-pay | Admitting: Endocrinology

## 2010-02-09 DIAGNOSIS — I2699 Other pulmonary embolism without acute cor pulmonale: Secondary | ICD-10-CM

## 2010-02-09 HISTORY — DX: Other pulmonary embolism without acute cor pulmonale: I26.99

## 2010-02-18 ENCOUNTER — Ambulatory Visit: Payer: Self-pay | Admitting: Oncology

## 2010-02-20 ENCOUNTER — Encounter: Payer: Self-pay | Admitting: Endocrinology

## 2010-02-20 ENCOUNTER — Encounter: Payer: Self-pay | Admitting: Cardiology

## 2010-02-20 LAB — CBC WITH DIFFERENTIAL/PLATELET
BASO%: 0.3 % (ref 0.0–2.0)
Basophils Absolute: 0 10*3/uL (ref 0.0–0.1)
EOS%: 2.6 % (ref 0.0–7.0)
Eosinophils Absolute: 0.2 10*3/uL (ref 0.0–0.5)
HCT: 39.5 % (ref 38.4–49.9)
HGB: 13.4 g/dL (ref 13.0–17.1)
LYMPH%: 16.5 % (ref 14.0–49.0)
MCH: 32 pg (ref 27.2–33.4)
MCHC: 33.8 g/dL (ref 32.0–36.0)
MCV: 94.7 fL (ref 79.3–98.0)
MONO#: 0.6 10*3/uL (ref 0.1–0.9)
MONO%: 9.5 % (ref 0.0–14.0)
NEUT#: 4.8 10*3/uL (ref 1.5–6.5)
NEUT%: 71.1 % (ref 39.0–75.0)
Platelets: 193 10*3/uL (ref 140–400)
RBC: 4.18 10*6/uL — ABNORMAL LOW (ref 4.20–5.82)
RDW: 14.6 % (ref 11.0–14.6)
WBC: 6.7 10*3/uL (ref 4.0–10.3)
lymph#: 1.1 10*3/uL (ref 0.9–3.3)

## 2010-02-20 LAB — COMPREHENSIVE METABOLIC PANEL
ALT: 19 U/L (ref 0–53)
AST: 29 U/L (ref 0–37)
Albumin: 4.5 g/dL (ref 3.5–5.2)
Alkaline Phosphatase: 57 U/L (ref 39–117)
BUN: 14 mg/dL (ref 6–23)
CO2: 26 mEq/L (ref 19–32)
Calcium: 9 mg/dL (ref 8.4–10.5)
Chloride: 103 mEq/L (ref 96–112)
Creatinine, Ser: 1.33 mg/dL (ref 0.40–1.50)
Glucose, Bld: 108 mg/dL — ABNORMAL HIGH (ref 70–99)
Potassium: 4.5 mEq/L (ref 3.5–5.3)
Sodium: 139 mEq/L (ref 135–145)
Total Bilirubin: 0.8 mg/dL (ref 0.3–1.2)
Total Protein: 6.2 g/dL (ref 6.0–8.3)

## 2010-02-21 ENCOUNTER — Encounter: Payer: Self-pay | Admitting: Endocrinology

## 2010-02-21 ENCOUNTER — Ambulatory Visit
Admission: RE | Admit: 2010-02-21 | Discharge: 2010-02-21 | Payer: Self-pay | Source: Home / Self Care | Attending: Endocrinology | Admitting: Endocrinology

## 2010-03-09 LAB — CONVERTED CEMR LAB
Cholesterol: 141 mg/dL (ref 0–200)
HDL: 41.1 mg/dL (ref >39.00)
LDL Cholesterol: 79 mg/dL (ref 0–99)
TSH: 1.22 microintl units/mL (ref 0.35–5.50)
Total CHOL/HDL Ratio: 3
Triglycerides: 105 mg/dL (ref 0.0–149.0)
VLDL: 21 mg/dL (ref 0.0–40.0)

## 2010-03-13 NOTE — Assessment & Plan Note (Signed)
Summary: ROV   Visit Type:  6  mo f/u Referring Provider:  na Primary Provider:  Romero Belling, MD   History of Present Illness: Still gettifng over rotator cuff surgery.  Still doing PT.  Has been five months.   Patient did have an event with xiphoid pain.  The pain was severe,  then went away.  He was having problems with chemotherapy, otherwise doing well.  Current Medications (verified): 1)  Gleevec 400 Mg Tabs (Imatinib Mesylate) .... Qd 2)  Lorazepam 0.5 Mg Tabs (Lorazepam) .... Take 1 At Bedtime As Needed 3)  Toprol Xl 25 Mg Xr24h-Tab (Metoprolol Succinate) .... Take 1 By Mouth Qd 4)  Nexium 40 Mg Cpdr (Esomeprazole Magnesium) .... Take 1 By Mouth Qd 5)  Plavix 75 Mg Tabs (Clopidogrel Bisulfate) .... Take 1 By Mouth Qd 6)  Imdur 30 Mg Xr24h-Tab (Isosorbide Mononitrate) .... Take 1 By Mouth Qd 7)  Promethazine Hcl 25 Mg Tabs (Promethazine Hcl) .... As Needed 8)  Multivitamins  Tabs (Multiple Vitamin) .... Once Daily 9)  Crestor 10 Mg Tabs (Rosuvastatin Calcium) .... Take As Directed 10)  Lisinopril 5 Mg Tabs (Lisinopril) .Marland Kitchen.. 1 Once Daily 11)  Diphenoxylate-Atropine 2.5-0.025 Mg Tabs (Diphenoxylate-Atropine) .... As Needed 12)  Imodium A-D 2 Mg Tabs (Loperamide Hcl) .... As Needed 13)  Maalox Plus 225-200-25 Mg/29ml Susp (Alum & Mag Hydroxide-Simeth) .... As Needed  Allergies: 1)  Percocet (Oxycodone-Acetaminophen) 2)  Sulfamethoxazole (Sulfamethoxazole)  Vital Signs:  Patient profile:   70 year old male Height:      66.5 inches Weight:      190 pounds BMI:     30.32 Pulse rate:   63 / minute Pulse rhythm:   114regular BP sitting:   114 / 60  (left arm) Cuff size:   large  Vitals Entered By: Danielle Rankin, CMA (October 21, 2009 4:28 PM)  Physical Exam  General:  Well developed, well nourished, in no acute distress. Head:  normocephalic and atraumatic Eyes:  PERRLA/EOM intact; conjunctiva and lids normal. Lungs:  Clear bilaterally to auscultation and  percussion. Heart:  PMII non displaced.  Normal S1 and S2.  Soft S4 gallop.  No def murmur.   Abdomen:  Bowel sounds positive; abdomen soft and non-tender without masses, organomegaly, or hernias noted. No hepatosplenomegaly. Pulses:  pulses normal in all 4 extremities Extremities:  No clubbing or cyanosis. Neurologic:  Alert and oriented x 3.   EKG  Procedure date:  10/21/2009  Findings:      NSR.  WNL.  Impression & Recommendations:  Problem # 1:  CORONARY ARTERY DISEASE (ICD-414.00) appears to be stable at the present time.  Smaller vessels invovled.  Continue current regimen for CAD.  His updated medication list for this problem includes:    Toprol Xl 25 Mg Xr24h-tab (Metoprolol succinate) .Marland Kitchen... Take 1 by mouth qd    Plavix 75 Mg Tabs (Clopidogrel bisulfate) .Marland Kitchen... Take 1 by mouth qd    Imdur 30 Mg Xr24h-tab (Isosorbide mononitrate) .Marland Kitchen... Take 1 by mouth qd    Lisinopril 5 Mg Tabs (Lisinopril) .Marland Kitchen... 1 once daily  Orders: EKG w/ Interpretation (93000)  Problem # 2:  HYPERLIPIDEMIA, MIXED (ICD-272.2) maintain treatment on Crestor without difficulty.  LDL 79 in March.  Followed by Dr. Everardo All. His updated medication list for this problem includes:    Crestor 10 Mg Tabs (Rosuvastatin calcium) .Marland Kitchen... Take as directed  Problem # 3:  HYPERTENSION (ICD-401.9) coontrolled at present time. His updated medication list for this problem includes:  Toprol Xl 25 Mg Xr24h-tab (Metoprolol succinate) .Marland Kitchen... Take 1 by mouth qd    Lisinopril 5 Mg Tabs (Lisinopril) .Marland Kitchen... 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  Remains on Gleevac.  Problem # 5:  GLUCOSE INTOLERANCE, HX OF (ICD-V12.2) Dr. Everardo All is primary.  Patient Instructions: 1)  Your physician recommends that you schedule a follow-up appointment in: 6 months with Dr. Riley Kill 2)  Your physician recommends that you continue on your current  medications as directed. Please refer to the Current Medication list given to you today.

## 2010-03-13 NOTE — Letter (Signed)
Summary: [facility]  [facility]   Imported By: Lennie Odor 03/15/2009 12:04:34  _____________________________________________________________________  External Attachment:    Type:   Image     Comment:   External Document

## 2010-03-13 NOTE — Assessment & Plan Note (Signed)
Summary: F/U FROM ENDOSCOPY             [name]   History of Present Illness Visit Type: Follow-up Visit Primary GI MD: Melvia Heaps MD [facility] Primary Provider: Romero Belling, MD Requesting Provider: na Chief Complaint: Follow up after EGD, patient states that he is having the same problems but he relates them to his chemo.  History of Present Illness:   [name] has returned following  upper endoscopy for upper abdominal pain.  Chronic gastritis was seen.  On b.i.d. Nexium his pain has subsided.  He is now taking Nexium once daily.  Every now and then he has upper GI discomfort when taking Gleevec and has temporarily held this medicine.   GI Review of Systems      Denies abdominal pain, acid reflux, belching, bloating, chest pain, dysphagia with liquids, dysphagia with solids, heartburn, loss of appetite, nausea, vomiting, vomiting blood, weight loss, and  weight gain.        Denies anal fissure, black tarry stools, change in bowel habit, constipation, diarrhea, diverticulosis, fecal incontinence, heme positive stool, hemorrhoids, irritable bowel syndrome, jaundice, light color stool, liver problems, rectal bleeding, and  rectal pain.    Current Medications (verified): 1)  Gleevec 400 Mg Tabs (Imatinib Mesylate) .... Qd 2)  Lorazepam 0.5 Mg Tabs (Lorazepam) .... Take 1 At Bedtime As Needed 3)  Toprol Xl 25 Mg Xr24h-Tab (Metoprolol Succinate) .... Take 1 By Mouth Qd 4)  Nexium 40 Mg Cpdr (Esomeprazole Magnesium) .... Take 1 By Mouth Qd 5)  Plavix 75 Mg Tabs (Clopidogrel Bisulfate) .... Take 1 By Mouth Qd 6)  Imdur 30 Mg Xr24h-Tab (Isosorbide Mononitrate) .... Take 1 By Mouth Qd 7)  Promethazine Hcl 25 Mg Tabs (Promethazine Hcl) .... As Needed 8)  Multivitamins  Tabs (Multiple Vitamin) .... Once Daily 9)  Crestor 10 Mg Tabs (Rosuvastatin Calcium) .... Take As Directed 10)  Lisinopril 5 Mg Tabs (Lisinopril) .Marland Kitchen.. 1 Once Daily 11)  Diphenoxylate-Atropine 2.5-0.025 Mg Tabs  (Diphenoxylate-Atropine) .... As Needed 12)  Imodium A-D 2 Mg Tabs (Loperamide Hcl) .... As Needed 13)  Maalox Plus 225-200-25 Mg/28ml Susp (Alum & Mag Hydroxide-Simeth) .... As Needed 14)  Prednisone 5 Mg Tabs (Prednisone) .... Take One By Mouth Once Daily  Allergies (verified): 1)  Percocet (Oxycodone-Acetaminophen) 2)  Sulfamethoxazole (Sulfamethoxazole)  Past History:  Past Medical History: Reviewed history from 08/01/2009 and no changes required. 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 Angioplasty 2001 Back Surgery x 2 Tonsillectomy  Family History: Reviewed history from 10/05/2008 and no changes required. No FH of Colon Cancer: Family History of Heart Disease: Both Parents Family History of Breast Cancer:Twin Sister  Social History: Reviewed history from 10/05/2008 and no changes required. Married Retired Patient has never smoked.  Alcohol Use - no Daily Caffeine Use Illicit Drug Use - no  Review of Systems  The patient denies allergy/sinus, anemia, anxiety-new, arthritis/joint pain, back pain, blood in urine, breast changes/lumps, change in vision, confusion, cough, coughing up blood, depression-new, fainting, fatigue, fever, headaches-new, hearing problems, heart murmur, heart rhythm changes, itching, menstrual pain, muscle pains/cramps, night sweats, nosebleeds, pregnancy symptoms, shortness of breath, skin rash, sleeping problems, sore throat, swelling of feet/legs, swollen lymph glands, thirst - excessive , urination -  excessive , urination changes/pain, urine leakage, vision changes, and voice change.    Vital Signs:  Patient profile:   70 year old male Height:      66.5 inches Weight:      186.6 pounds BMI:     29.77 Pulse rate:   60 / minute Pulse rhythm:   regular BP sitting:   112 / 64  (left arm) Cuff size:   regular  Vitals Entered By: Harlow Mares CMA Duncan Dull) (September 10, 2009 9:28 AM)   Impression & Recommendations:  Problem # 1:  ABDOMINAL PAIN-EPIGASTRIC (ICD-789.06) Assessment Improved This is likely related to gastritis.  In addition, he has periodic discomfort that is probably related to Gleevec.  Recommendations #1 continue Nexium #2 return p.r.n.  Patient Instructions: 1)  Copy sent to : Romero Belling, MD, Arnoldo Lenis, MD 2)  Call back as needed  3)  Continue Nexium 4)  The medication list was reviewed and reconciled.  All changed / newly prescribed medications were explained.  A complete medication list was provided to the patient / caregiver.

## 2010-03-13 NOTE — Letter (Signed)
Summary: EGD Instructions  [facility]  [address] [place], [place] [address]   Phone: [phone]  Fax: [phone]       ADLER CHARTRAND    03-17-1940    MRN: [id number]       Procedure Day /Date: 08-08-09     Arrival Time: 1:30 PM      Procedure Time: 2:30 PM     Location of Procedure:                    X     [facility] (4th Floor)    PREPARATION FOR ENDOSCOPY   On 08-08-09 THE DAY OF THE PROCEDURE:  1.   No solid foods, milk or milk products are allowed after midnight the night before your procedure.  2.   Do not drink anything colored red or purple.  Avoid juices with pulp.  No orange juice.  3.  You may drink clear liquids until 12:30 PM , which is 2 hours before your procedure.                                                                                                CLEAR LIQUIDS INCLUDE: Water Jello Ice Popsicles Tea (sugar ok, no milk/cream) Powdered fruit flavored drinks Coffee (sugar ok, no milk/cream) Gatorade Juice: apple, white grape, white cranberry  Lemonade Clear bullion, consomm, broth Carbonated beverages (any kind) Strained chicken noodle soup Hard Candy   MEDICATION INSTRUCTIONS  Unless otherwise instructed, you should take regular prescription medications with a small sip of water as early as possible the morning of your procedure.  We will call you once we hear from Dr. Rosalyn Charters  office regarding the Plavix.           OTHER INSTRUCTIONS  You will need a responsible adult at least 72 years of age to accompany you and drive you home.   This person must remain in the waiting room during your procedure.  Wear loose fitting clothing that is easily removed.  Leave jewelry and other valuables at home.  However, you may wish to bring a book to read or an iPod/MP3 player to listen to music as you wait for your procedure to start.  Remove all body piercing jewelry and leave at home.  Total time from  sign-in until discharge is approximately 2-3 hours.  You should go home directly after your procedure and rest.  You can resume normal activities the day after your procedure.  The day of your procedure you should not:   Drive   Make legal decisions   Operate machinery   Drink alcohol   Return to work  You will receive specific instructions about eating, activities and medications before you leave.    The above instructions have been reviewed and explained to me by   _______________________    I fully understand and can verbalize these instructions _____________________________ Date _________

## 2010-03-13 NOTE — Assessment & Plan Note (Signed)
Summary: f45m   Visit Type:  6 months follow up Primary Provider:  Romero Belling, MD  CC:  Surgical clearance (L) rotator cuff repair.  History of Present Illness: Overall has been doing well, denies any chest pain.  Has had some diahreaa.   He has a shoulder issue, cannot play golf, and rotator cuff repair is planned.  Dr. Darrelyn Hillock has suggested surgery.  Has not had any significant stress test in quite a while, and last cath 2003.  They do alot of walking at present, and he thinks he could walk 2 or 3 miles without significant difficulty.  Cannot be without Nexium because of Gleevac.  He will check with his insurer regarding plavix.    Current Medications (verified): 1)  Gleevec 400 Mg Tabs (Imatinib Mesylate) .... Qd 2)  Lorazepam 0.5 Mg Tabs (Lorazepam) .... Take 1 At Bedtime As Needed 3)  Toprol Xl 25 Mg Xr24h-Tab (Metoprolol Succinate) .... Take 1 By Mouth Qd 4)  Nexium 40 Mg Cpdr (Esomeprazole Magnesium) .... Take 1 By Mouth Qd 5)  Plavix 75 Mg Tabs (Clopidogrel Bisulfate) .... Take 1 By Mouth Qd 6)  Imdur 30 Mg Xr24h-Tab (Isosorbide Mononitrate) .... Take 1 By Mouth Qd 7)  Promethazine Hcl 25 Mg Tabs (Promethazine Hcl) .... As Needed 8)  Multivitamins  Tabs (Multiple Vitamin) .... Once Daily 9)  Crestor 10 Mg Tabs (Rosuvastatin Calcium) .... Take As Directed 10)  Hydrocodone-Acetaminophen 10-325 Mg Tabs (Hydrocodone-Acetaminophen) .Marland Kitchen.. 1 Q4h As Needed Pain 11)  Lisinopril 5 Mg Tabs (Lisinopril) .Marland Kitchen.. 1 Once Daily 12)  Diphenoxylate-Atropine 2.5-0.025 Mg Tabs (Diphenoxylate-Atropine) .... As Needed 13)  Imodium A-D 2 Mg Tabs (Loperamide Hcl) .... 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  Alcohol Use - no Daily Caffeine Use Illicit Drug Use - no  Vital Signs:  Patient profile:   70 year old male Height:      66.5 inches Weight:      192 pounds BMI:     30.64 Pulse rate:   60 / minute Pulse rhythm:   regular Resp:     18 per minute BP sitting:   136 / 72  (right arm) Cuff size:   large  Vitals Entered By: Vikki Ports (May 02, 2009 10:44 AM)  Physical Exam  General:  Well developed, well nourished, in no acute distress. Head:  normocephalic and atraumatic Eyes:  PERRLA/EOM intact; conjunctiva and lids normal. Lungs:  Clear bilaterally to auscultation and percussion. Heart:  PMI non displaced.  Normal S1 and S2.  Without murmur or gallop.  Minimal SEM.   Abdomen:  Bowel sounds positive; abdomen soft and non-tender without masses, organomegaly, or hernias noted. No hepatosplenomegaly. Extremities:  No clubbing or cyanosis. Neurologic:  Alert and oriented x 3.   Cardiac Cath  Procedure date:  03/17/2001  Findings:      ANGIOGRAPHIC FINDINGS: 1. The left main coronary artery has approximately 10-20% stenosis, but    no significant flow-limiting lesions. 2. The left anterior descending is a medium caliber vessel that  provides    three diagonal branches. There is scattered 20% stenosis in the proximal    to mid vessel with 30% more distal stenosis followed by an area of    50-60% stenosis towards the apex. Flow is TIMI-3 throughout this vessel. 3. The circumflex coronary artery essentially provides one large obtuse    marginal branch then has a 20% stenosis. 4. The right coronary artery is a dominant vessel that essentially has two    branches in the posterior descending distribution. There was a 30%    distal RCA stenosis and a focal 95% stenosis in the smaller of the two    branches in the posterior descending territory.  LEFT VENTRICULOGRAM: The left ventriculography reveals an ejection fraction estimated at 60-65% with no focal wall motion abnormalities and no mitral regurgitation.   EF-Ejection Fraction  Procedure date:  04/24/2009  Findings:      SB.  Otherwise normal.  Impression & Recommendations:  Problem # 1:  CORONARY ARTERY DISEASE (ICD-414.00)  Stable but is scheduled for surgery, known disease.  Can achieve 4 mets easily.  No workup in greater than 5 years, with known disease.   His updated medication list for this problem includes:    Toprol Xl 25 Mg Xr24h-tab (Metoprolol succinate) .Marland Kitchen... Take 1 by mouth qd    Plavix 75 Mg Tabs (Clopidogrel bisulfate) .Marland Kitchen... Take 1 by mouth qd    Imdur 30 Mg Xr24h-tab (Isosorbide mononitrate) .Marland Kitchen... Take 1 by mouth qd    Lisinopril 5 Mg Tabs (Lisinopril) .Marland Kitchen... 1 once daily  Orders: EKG w/ Interpretation (93000) Nuclear Stress Test (Nuc Stress Test)  Problem # 2:  HYPERLIPIDEMIA, MIXED (ICD-272.2)  Labs per Dr. Everardo All His updated medication list for this problem includes:    Crestor 10 Mg Tabs (Rosuvastatin calcium) .Marland Kitchen... Take as directed  His updated medication list for this problem includes:    Crestor 10 Mg Tabs (Rosuvastatin calcium) .Marland Kitchen... Take as directed  Orders: EKG w/ Interpretation (93000) Nuclear Stress Test (Nuc Stress  Test)  Problem # 3:  HYPERTENSION (ICD-401.9)  controlled.   His updated medication list for this problem includes:    Toprol Xl 25 Mg Xr24h-tab (Metoprolol succinate) .Marland Kitchen... Take 1 by mouth qd    Lisinopril 5 Mg Tabs (Lisinopril) .Marland Kitchen... 1 once daily  His updated medication list for this problem includes:    Toprol Xl 25 Mg Xr24h-tab (Metoprolol succinate) .Marland Kitchen... Take 1 by mouth qd    Lisinopril 5 Mg Tabs (Lisinopril) .Marland Kitchen... 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  Patient Instructions: 1)  Your physician recommends that you continue on your current medications as directed. Please refer to the Current Medication list given to you today. 2)  Your physician wants you to follow-up in 6 month:   You will receive a reminder letter in the mail two months in advance. If you don't receive a letter, please call our office to schedule the follow-up appointment. 3)  Your physician has requested that you have an exercise stress myoview.  For further information please visit [email or web address]  Please follow instruction sheet, as given. EKG was NOT obtained today in the office.  This pt  had an EKG performed by his PCP on 04/24/09.  Julieta Gutting, RN, BSN  May 02, 2009 5:03 PM

## 2010-03-13 NOTE — Progress Notes (Signed)
Summary: Triage  Phone Note Call from Patient Call back at Home Phone [phone]   Caller: spouse  Steward Drone Call For: Dr. Arlyce Dice Reason for Call: Talk to Nurse Summary of Call: pt. had a endo yesterday and was told to f/u in 3 wks. Informed pt. Dr. Arlyce Dice next appt. is 10-15-09 and I could add to wait list. She requested a sooner appt. Initial call taken by: Karna Christmas,  August 09, 2009 8:38 AM  Follow-up for Phone Call        Pt. will see Dr.Siham Bucaro on 09-10-09 at 9:30am. Message left for pt. to callback if there is any problem with appt. Pt. instructed to call back as needed.  Follow-up by: Laureen Ochs LPN,  August 09, 3084 9:31 AM

## 2010-03-13 NOTE — Assessment & Plan Note (Signed)
Summary: PER [facility]RISTY-SCHED--[facility]OT[facility]FF--STC   Vital Signs:  Patient profile:   70 year old male Height:      66.5 inches (168.91 cm) Weight:      192.50 pounds (87.50 kg) O2 Sat:      97 % on Room air Temp:     97.2 degrees F (36.22 degrees C) oral Pulse rate:   65 / minute BP sitting:   152 / 72  (right arm) Cuff size:   large  Vitals Entered By: Josph Macho RMA (April 24, 2009 8:13 AM)  O2 Flow:  Room air CC: Surgery Clearance/ pt is also scheduled for clearance next week with Dr Riley Kill pt is no longer taking Flomax or Analpram/ CF Is Patient Diabetic? No   Primary Provider:  Romero Belling, MD  CC:  Surgery Clearance/ pt is also scheduled for clearance next week with Dr Riley Kill pt is no longer taking Flomax or Analpram/ CF.  History of Present Illness: the status of 3 chronic medical problems is addressed today: htn:  he takes toprol and imdur as rx'ed.  no headache.  cad:  no recent chest pain.  dr Riley Kill has suggested he may want to taper off the imdur. cml:  he tolerates gleevec well.  Current Medications (verified): 1)  Gleevec 400 Mg Tabs (Imatinib Mesylate) .... Qd 2)  Lorazepam 0.5 Mg Tabs (Lorazepam) .... Take 1 At Bedtime As Needed 3)  Toprol Xl 25 Mg Xr24h-Tab (Metoprolol Succinate) .... Take 1 By Mouth Qd 4)  Nexium 40 Mg Cpdr (Esomeprazole Magnesium) .... Take 1 By Mouth Qd 5)  Plavix 75 Mg Tabs (Clopidogrel Bisulfate) .... Take 1 By Mouth Qd 6)  Imdur 30 Mg Xr24h-Tab (Isosorbide Mononitrate) .... Take 1 By Mouth Qd 7)  Ambien 10 Mg Tabs (Zolpidem Tartrate) .... Take 1 At Bedtime As Needed 8)  Promethazine Hcl 25 Mg Tabs (Promethazine Hcl) .... As Needed 9)  Multivitamins  Tabs (Multiple Vitamin) .... Once Daily 10)  Flomax 0.4 Mg Xr24h-Cap (Tamsulosin Hcl) .... Once Daily As Needed 11)  Analpram-Hc 1-2.5 % Crea (Hydrocortisone Ace-Pramoxine) .... Use Externally On The Rectal Area 2-3 Times Daily 12)  Crestor 10 Mg Tabs (Rosuvastatin Calcium) ....  Take As Directed 13)  Hydrocodone-Acetaminophen 10-325 Mg Tabs (Hydrocodone-Acetaminophen) .Marland Kitchen.. 1 Q4h As Needed Pain  Allergies (verified): 1)  Percocet (Oxycodone-Acetaminophen) 2)  Sulfamethoxazole (Sulfamethoxazole)  Past History:  Past Medical History: Last updated: 10/05/2008 Current Problems:  CORONARY ARTERY DISEASE (ICD-414.00) HYPERLIPIDEMIA, MIXED (ICD-272.2) GLUCOSE INTOLERANCE, HX OF (ICD-V12.2) ABDOMINAL PAIN-PERIUMBILICAL (ICD-789.05) ABDOMINAL PAIN OTHER SPECIFIED SITE (ICD-789.09) ANAL OR RECTAL PAIN (ICD-569.42) DIVERTICULOSIS-COLON (ICD-562.10) PERSONAL HX COLONIC POLYPS (ICD-V12.72) HEMORRHOIDS-INTERNAL (ICD-455.0) HEMORRHOIDS-EXTERNAL (ICD-455.3) RASH-NONVESICULAR (ICD-782.1) PNEUMONIA (ICD-486) CONJUNCTIVITIS (ICD-372.30) NEPHROLITHIASIS, HX OF (ICD-V13.01) LEUKEMIA, MYELOID, 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    Physical Exam  General:  normal appearance.   Lungs:  Clear to auscultation bilaterally. Normal respiratory effort.  Heart:  Regular rate and rhythm without murmurs or gallops noted. Normal S1,S2.   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  Medications Added to Medication List This Visit: 1)  Lisinopril 5 Mg Tabs (  Lisinopril) .Marland Kitchen.. 1 once daily  Other Orders: EKG w/ Interpretation (93000) TLB-Lipid Panel (80061-LIPID) TLB-TSH (Thyroid Stimulating Hormone) (84443-TSH) Est. Patient Level IV ([address])  Patient Instructions: 1)  add lisinopril 5 mg once daily. 2)  see dr Riley Kill as scheduled next week.   3)   his surgical risk is low and outweighed by the potential benefit of the surgery.  he is therefore medically cleared, unless dr Riley Kill objects. 4)  tests are being ordered for you today.  a few days after the test(s), please call [phone] to hear your test results. Prescriptions: LISINOPRIL 5 MG TABS (LISINOPRIL) 1 once daily  #90 x 3   Entered and Authorized by:   Minus Breeding MD   Signed by:   Minus Breeding MD on 04/24/2009   Method used:   Electronically to        [facility] [phone]* (retail)       [address]       [place], [place]  [address]       Ph: [phone] or [phone]       Fax: [phone]   RxID:   [id number]

## 2010-03-13 NOTE — Progress Notes (Signed)
  Faxed all Cardiac over to [facility] to fax [phone] [facility]  April 24, 2009 3:56 PM

## 2010-03-13 NOTE — Letter (Signed)
Summary: [facility]   Imported By: Lester Crofton 07/10/2009 08:11:46  _____________________________________________________________________  External Attachment:    Type:   Image     Comment:   External Document

## 2010-03-13 NOTE — Progress Notes (Signed)
Summary: ABX?  Phone Note Call from Patient   Caller: Patient [phone] Summary of Call: pt called stating that sore in his mouth has returned. Pt is requesting refill of ABX from last OV Initial call taken by: Margaret Pyle, CMA,  June 05, 2009 10:20 AM  Follow-up for Phone Call        please verify no fever. i refilled cefuroxime. if it comes back again, you should see an ent specialist. Follow-up by: Minus Breeding MD,  June 05, 2009 10:44 AM  Additional Follow-up for Phone Call Additional follow up Details #1::        No fever, Pt will call back if sys return Additional Follow-up by: Margaret Pyle, CMA,  June 05, 2009 10:49 AM    Prescriptions: CEFUROXIME AXETIL 250 MG TABS (CEFUROXIME AXETIL) 1 tab two times a day  #14 x 0   Entered and Authorized by:   Minus Breeding MD   Signed by:   Minus Breeding MD on 06/05/2009   Method used:   Electronically to        [facility]. [phone]* (retail)       [address]       [place], [place]  [address]       Ph: [phone] or [phone]       Fax: [phone]   RxID:   [id number]

## 2010-03-13 NOTE — Letter (Signed)
Summary: [facility]   Imported By: Sherian Rein 04/05/2009 07:49:33  _____________________________________________________________________  External Attachment:    Type:   Image     Comment:   External Document

## 2010-03-13 NOTE — Letter (Signed)
Summary: Pre-Op Clearance/[facility]  Pre-Op Clearance/[facility]   Imported By: Sherian Rein 05/01/2009 07:17:23  _____________________________________________________________________  External Attachment:    Type:   Image     Comment:   External Document

## 2010-03-13 NOTE — Assessment & Plan Note (Signed)
Summary: white patches in mouth/cd   Vital Signs:  Patient profile:   70 year old male Height:      66.5 inches (168.91 cm) Weight:      186.50 pounds (84.77 kg) O2 Sat:      97 % on Room air Temp:     97.7 degrees F (36.50 degrees C) oral Pulse rate:   68 / minute BP sitting:   142 / 78  (left arm) Cuff size:   large  Vitals Entered By: Josph Macho RMA (May 24, 2009 9:52 AM)  O2 Flow:  Room air CC: Sore in mouth and back of throat X1day/ pt states he is no longer taking Hydrocodone/ CF   Primary Provider:  Romero Belling, MD  CC:  Sore in mouth and back of throat X1day/ pt states he is no longer taking Hydrocodone/ CF.  History of Present Illness: pt states few days of sore throat, and associated lymph nodes in the neck.  he had a "white patch" inside the mouth, but it has resolved.  he had similar sxs 2 mos ago--resolved with abx.  Current Medications (verified): 1)  Gleevec 400 Mg Tabs (Imatinib Mesylate) .... Qd 2)  Lorazepam 0.5 Mg Tabs (Lorazepam) .... Take 1 At Bedtime As Needed 3)  Toprol Xl 25 Mg Xr24h-Tab (Metoprolol Succinate) .... Take 1 By Mouth Qd 4)  Nexium 40 Mg Cpdr (Esomeprazole Magnesium) .... Take 1 By Mouth Qd 5)  Plavix 75 Mg Tabs (Clopidogrel Bisulfate) .... Take 1 By Mouth Qd 6)  Imdur 30 Mg Xr24h-Tab (Isosorbide Mononitrate) .... Take 1 By Mouth Qd 7)  Promethazine Hcl 25 Mg Tabs (Promethazine Hcl) .... As Needed 8)  Multivitamins  Tabs (Multiple Vitamin) .... Once Daily 9)  Crestor 10 Mg Tabs (Rosuvastatin Calcium) .... Take As Directed 10)  Hydrocodone-Acetaminophen 10-325 Mg Tabs (Hydrocodone-Acetaminophen) .Marland Kitchen.. 1 Q4h As Needed Pain 11)  Lisinopril 5 Mg Tabs (Lisinopril) .Marland Kitchen.. 1 Once Daily 12)  Diphenoxylate-Atropine 2.5-0.025 Mg Tabs (Diphenoxylate-Atropine) .... As Needed 13)  Imodium A-D 2 Mg Tabs (Loperamide Hcl) .... As Needed 14)  Methocarbamol 500 Mg Tabs (Methocarbamol) .... As Needed 15)  Fish Oil 600mg  .... 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    Physical Exam  General:  normal appearance.   Head:  head: no deformity eyes: no periorbital swelling, no proptosis external nose and ears are normal mouth: no lesion seen Cervical Nodes:  few tender ext cervical ln's   Impression & Recommendations:  Problem # 1:  URI (ICD-465.9) Assessment New  Problem # 2:  HYPERTENSION (ICD-401.9) with ? of situational component  Medications Added to Medication List This Visit: 1)  Methocarbamol 500 Mg Tabs (Methocarbamol) .... As needed 2)  Fish Oil 600mg   .... Two times a day 3)  Cefuroxime Axetil 250 Mg Tabs (Cefuroxime axetil) .Marland Kitchen.. 1 tab two times a day  Other Orders: Est. Patient Level III ([address]) Prescription Created Electronically [phone])  Patient Instructions: 1)  cefuroxime 250 mg two times a day. 2)  call next  week if not better. 3)  we'll follow bp for now Prescriptions: CEFUROXIME AXETIL 250 MG TABS (CEFUROXIME AXETIL) 1 tab two times a day  #14 x 0   Entered and Authorized by:   Minus Breeding MD   Signed by:   Minus Breeding MD on 05/24/2009   Method used:   Electronically to        [facility]. [phone]* (retail)       [address]       [place], [place]  [address]       Ph: [phone] or [phone]       Fax: [phone]   RxID:   [id number]   Laboratory Results    Other Tests  Rapid Strep: negative

## 2010-03-13 NOTE — Assessment & Plan Note (Signed)
Summary: rash on neck/itching/lb   Vital Signs:  Patient profile:   70 year old male Height:      66.5 inches (168.91 cm) Weight:      188.50 pounds (85.68 kg) BMI:     30.08 O2 Sat:      97 % on Room air Temp:     98.5 degrees F (36.94 degrees C) oral Pulse rate:   54 / minute Pulse rhythm:   regular BP sitting:   114 / 68  (left arm) Cuff size:   large  Vitals Entered By: Brenton Grills CMA Duncan Dull) (February 21, 2010 9:24 AM)  O2 Flow:  Room air CC: Rash on neck x 1 month/aj Is Patient Diabetic? No Comments Pt is no longer taking Valacyclovir or using Prednislone eye drops   Referring Provider:  na Primary Provider:  Romero Belling, MD  CC:  Rash on neck x 1 month/aj.  History of Present Illness: 1 month of slight rash on the neck, and assoc itching.  he is unable to cite precip factor.  Current Medications (verified): 1)  Gleevec 400 Mg Tabs (Imatinib Mesylate) .... Qd 2)  Lorazepam 0.5 Mg Tabs (Lorazepam) .... Take 1 At Bedtime As Needed 3)  Toprol Xl 25 Mg Xr24h-Tab (Metoprolol Succinate) .... Take 1 By Mouth Qd 4)  Nexium 40 Mg Cpdr (Esomeprazole Magnesium) .... Take 1 By Mouth Qd 5)  Plavix 75 Mg Tabs (Clopidogrel Bisulfate) .... Take 1 By Mouth Qd 6)  Imdur 30 Mg Xr24h-Tab (Isosorbide Mononitrate) .... Take 1 By Mouth Qd 7)  Promethazine Hcl 25 Mg Tabs (Promethazine Hcl) .... As Needed 8)  Multivitamins  Tabs (Multiple Vitamin) .... Once Daily 9)  Crestor 10 Mg Tabs (Rosuvastatin Calcium) .... Take As Directed 10)  Lisinopril 5 Mg Tabs (Lisinopril) .Marland Kitchen.. 1 Once Daily 11)  Diphenoxylate-Atropine 2.5-0.025 Mg Tabs (Diphenoxylate-Atropine) .... As Needed 12)  Imodium A-D 2 Mg Tabs (Loperamide Hcl) .... As Needed 13)  Prednisolone Acetate 1 % Susp (Prednisolone Acetate) .Marland Kitchen.. 1 Drop in Each Eye Two Times A Day 14)  Valacyclovir Hcl 1 Gm Tabs (Valacyclovir Hcl) .Marland Kitchen.. 1 Tab Three Times A Day 15)  Pyridostigmine Bromide 60 Mg Tabs (Pyridostigmine Bromide) .Marland Kitchen.. 1 Tab Three  Times A Day 16)  Ondansetron Hcl 4 Mg Tabs (Ondansetron Hcl) .Marland Kitchen.. 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    Physical Exam  General:  normal appearance.   Skin:  there is a moderate eczematous rash in a necklace distribution.   Impression & Recommendations:  Problem # 1:  SKIN RASH (ICD-782.1) Assessment New uncertain etiology  Medications Added to Medication List This Visit: 1)  Ondansetron Hcl 4 Mg Tabs (Ondansetron hcl) .Marland Kitchen.. 1 tablet every 6 hours as needed 2)  Triamcinolone Acetonide 0.5 % Crea (Triamcinolone acetonide) .... Three times a day as needed for rash  Other Orders: Est. Patient Level III (82956)  Patient Instructions: 1)  triancinolone 0.5% cream, three times a day as needed for rash. 2)  call next week if not better.   3)  Please schedule a "medicare wellness" appointment in 1 month.  Prescriptions:  TRIAMCINOLONE ACETONIDE 0.5 % CREA (TRIAMCINOLONE ACETONIDE) three times a day as needed for rash  #1 med tube x 1   Entered and Authorized by:   Minus Breeding MD   Signed by:   Minus Breeding MD on 02/21/2010   Method used:   Electronically to        [facility]. [phone]* (retail)       [address]       [place], [place]  [address]       Ph: [phone] or [phone]       Fax: [phone]   RxID:    [id number]    Orders Added: 1)  Est. Patient Level III [66440]

## 2010-03-13 NOTE — Letter (Signed)
[facility]   Imported By: Lester Panama 05/01/2009 10:16:33  _____________________________________________________________________  External Attachment:    Type:   Image     Comment:   External Document  Appended Document: [facility] document reviewed.  TS

## 2010-03-13 NOTE — Assessment & Plan Note (Signed)
Summary: EPIGASTRIC PAIN, BLOATING,GAS     (DR.KAPLAN PT.)     DEBORAH   History of Present Illness Visit Type: Follow-up Visit Primary GI MD: Melvia Heaps MD [facility] Primary Provider: Romero Belling, MD Requesting Provider: na Chief Complaint: Epigastric pain and bloating  History of Present Illness:   Patient known to Dr. Arlyce Dice for history of gastroparesis and also colon polyps August 2010.  Patient has chronic intermittent bloating / nausea / vomiting/ diarrhea which he attributes to Gleevac taken for leukemia. Patient here today for a one month history of epigastric pain and early satiety. Several years ago patient had EGD and HIDA for workup of RUQ pain (both normal).  His current pain is different. Epigastric pain is not related to anything in particular such as movement, exertion or eating. On Monday pain was severe with radation through to mid back.  Took Maalox but pain didn't subside for two hours.  Had Hyoscyamine at home which he started taking three days ago without significant improvement. A couple of times he held Gleevac with variable results.   He has been on a daily PPI for years. No fevers or chills.  No hematemesis or black stools.   No SOB or chest pain. Has CAD but had stress test in March (Dr. Riley Kill). Reports weight loss, weight stable since April by our scales however.   GI Review of Systems    Reports abdominal pain and  bloating.     Location of  Abdominal pain: epigastric area.    Denies acid reflux, belching, chest pain, dysphagia with liquids, dysphagia with solids, heartburn, loss of appetite, nausea, vomiting, vomiting blood, weight loss, and  weight gain.        Denies anal fissure, black tarry stools, change in bowel habit, constipation, diarrhea, diverticulosis, fecal incontinence, heme positive stool, hemorrhoids, irritable bowel syndrome, jaundice, light color stool, liver problems, rectal bleeding, and  rectal pain.   Current Medications (verified): 1)   Gleevec 400 Mg Tabs (Imatinib Mesylate) .... Qd 2)  Lorazepam 0.5 Mg Tabs (Lorazepam) .... Take 1 At Bedtime As Needed 3)  Toprol Xl 25 Mg Xr24h-Tab (Metoprolol Succinate) .... Take 1 By Mouth Qd 4)  Nexium 40 Mg Cpdr (Esomeprazole Magnesium) .... Take 1 By Mouth Qd 5)  Plavix 75 Mg Tabs (Clopidogrel Bisulfate) .... Take 1 By Mouth Qd 6)  Imdur 30 Mg Xr24h-Tab (Isosorbide Mononitrate) .... Take 1 By Mouth Qd 7)  Promethazine Hcl 25 Mg Tabs (Promethazine Hcl) .... As Needed 8)  Multivitamins  Tabs (Multiple Vitamin) .... Once Daily 9)  Crestor 10 Mg Tabs (Rosuvastatin Calcium) .... Take As Directed 10)  Lisinopril 5 Mg Tabs (Lisinopril) .Marland Kitchen.. 1 Once Daily 11)  Diphenoxylate-Atropine 2.5-0.025 Mg Tabs (Diphenoxylate-Atropine) .... As Needed 12)  Imodium A-D 2 Mg Tabs (Loperamide Hcl) .... As Needed 13)  Maalox Plus 225-200-25 Mg/78ml Susp (Alum & Mag Hydroxide-Simeth) .... 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 Angioplasty 2001 Back Surgery x 2 Tonsillectomy  Family History: Reviewed history from 10/05/2008 and no changes required. No FH of Colon Cancer: Family History of Heart Disease: Both Parents Family History of Breast Cancer:Twin Sister  Social History: Reviewed history from 10/05/2008 and no changes required. Married Retired Patient has never smoked.  Alcohol Use - no Daily  Caffeine Use Illicit Drug Use - no  Review of Systems  The patient denies allergy/sinus, anemia, anxiety-new, arthritis/joint pain, back pain, blood in urine, breast changes/lumps, change in vision, confusion, cough, coughing up blood, depression-new, fainting, fatigue, fever, headaches-new, hearing problems, heart murmur, heart rhythm changes, itching, menstrual pain, muscle pains/cramps, night sweats, nosebleeds, pregnancy symptoms, shortness of breath, skin rash, sleeping problems, sore throat, swelling of feet/legs, swollen lymph glands, thirst - excessive , urination - excessive , urination changes/pain, urine leakage, vision changes, and voice change.    Vital Signs:  Patient profile:   70 year old male Height:      66.5 inches Weight:      187 pounds BMI:     29.84 BSA:     1.96 Pulse rate:   76 / minute Pulse rhythm:   regular BP sitting:   128 / 64  (left arm) Cuff size:   regular  Vitals Entered By: Ok Anis CMA (August 01, 2009 1:17 PM)  Physical Exam  General:  Well developed, well nourished, no acute distress. Head:  Normocephalic and atraumatic. Eyes:  Conjunctiva pink, no icterus.  Neck:  no obvious masses  Lungs:  Clear throughout to auscultation. Heart:  RRR Abdomen:  Abdomen soft, nondistended. Mild to moderate tenderness just below Xiphoid process. No obvious masses or hepatomegaly.Normal bowel sounds.  Msk:  Symmetrical with no gross deformities. Normal posture. No sternal or xiphoid process tenderness to palpation. Extremities:  No palmar erythema, no edema.  Neurologic:  Alert and  oriented x4;  grossly normal neurologically. Skin:  Intact without significant lesions or rashes. Cervical Nodes:  No significant cervical adenopathy. Psych:  Alert and cooperative. Normal mood and affect.  Impression & Recommendations:  Problem # 1:  ABDOMINAL PAIN-EPIGASTRIC (ICD-789.06) Assessment New New over the last month and associated with early satiety.  Recent  U/S, CBC and CMET normal. Rule out PUD though his chronic PPI use makes that less likely. Rule out pancreatic source of pain, will obtain Amylase and Lipase. Cannot exclude biliary disease though U/S, labs unremarkable. For evaluation patient will be scheduled for an EGD with biopsies ( if indicated).  The risks and benefits of the procedure, as well as alternatives were discussed with the patient and he agrees to proceed. Ideally, Plavix would be held for the EGD but will need to contact the prescribing physician for recommendations. Orders: TLB-Amylase (82150-AMYL) TLB-Lipase (83690-LIPASE) EGD (EGD)  Problem # 2:  CORONARY ARTERY DISEASE (ICD-414.00) Assessment: Comment Only On Plavix.   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 2)  We have scheduled the Endoscopy with Dr. Arlyce Dice on 08-08-09. 3)  We will call you once we hear from Dr. Rosalyn Charters office regarding the Plavix. 4)  [facility] Patient Information Guide given to patient. 5)   Copy: Romero Belling, MD 6)  The medication list was reviewed and reconciled.  All changed / newly prescribed medications were explained.  A complete medication list was provided to the patient / caregiver.

## 2010-03-13 NOTE — Letter (Signed)
Summary: [facility]   Imported By: Sherian Rein 03/28/2009 11:49:44  _____________________________________________________________________  External Attachment:    Type:   Image     Comment:   External Document

## 2010-03-13 NOTE — Letter (Signed)
Summary: [facility]   Imported By: Lester [place] 04/26/2009 10:12:47  _____________________________________________________________________  External Attachment:    Type:   Image     Comment:   External Document

## 2010-03-13 NOTE — Letter (Signed)
Summary: Anticoagulation Modification Letter   Gastroenterology  [address] [place], [place] [address]   Phone: [phone]  Fax: [phone]    August 01, 2009  Re:    [name] DOB:    12/24/1940 MRN:    [id number]    Dear Dr. Riley Kill:  We have scheduled the above patient for an endoscopic procedure , an Endoscopy. Our records show that  he/she is on anticoagulation therapy. Please advise as to how long the patient may come off their therapy of Plavix  prior to the scheduled procedure(s) on 08-08-09. Dr. Melvia Heaps will be performing the procedure.   Please fax back/or route the completed form to [facility] CMA at [phone].  Thank you for your help with this matter.  Sincerely,   Dr. Melvia Heaps  Physician Recommendation:  Hold Plavix 7 days prior ________________  Hold Coumadin 5 days prior ____________  Other ______________________________     Appended Document: Anticoagulation Modification Letter Patient can hold plavix for seven days.  He does not have DES.  Medically treated CAD.  Chart reviewed.  TS  Appended Document: Anticoagulation Modification Letter Dr. Brien Few office already made pt aware.

## 2010-03-13 NOTE — Progress Notes (Signed)
Summary: Nexium refill  Phone Note Refill Request Message from:  Fax from Pharmacy on June 07, 2009 2:20 PM  Refills Requested: Medication #1:  NEXIUM 40 MG CPDR TAKE 1 by mouth QD   Dosage confirmed as above?Dosage Confirmed   Brand Name Necessary? No   Supply Requested: 3 months Sent to faxes back to [phone]   Method Requested: Fax to Local Pharmacy Initial call taken by: Merri Ray CMA Duncan Dull),  June 07, 2009 2:23 PM

## 2010-03-13 NOTE — Letter (Signed)
Summary: [facility]   Imported By: Lester  10/21/2009 09:13:37  _____________________________________________________________________  External Attachment:    Type:   Image     Comment:   External Document

## 2010-03-13 NOTE — Letter (Signed)
Summary: Appt Reminder 2  [facility]  [address] [place], [place] [address]   Phone: [phone]  Fax: [phone]        August 09, 2009 MRN: [id number]    [facility] [address] [place], [place]  [address]    Dear Mr. Lisby,   You have a return appointment with Dr.Robert Arlyce Dice on 09-10-09 at 9:30am. Please remember to bring a complete list of the medicines you are taking, your insurance card and your co-pay.  If you have to cancel or reschedule this appointment, please call before 5:00 pm the evening before to avoid a cancellation fee.  If you have any questions or concerns, please call [phone].    Sincerely,    Laureen Ochs LPN  Appended Document: Appt Reminder 2 Letter mailed to patient.

## 2010-03-13 NOTE — Progress Notes (Signed)
Summary: [facility]  Phone Note Outgoing Call   Summary of Call: Faxed completed paperwork to [facility] and sent copy to be scanned. Initial call taken by: Josph Macho RMA,  April 29, 2009 7:41 AM

## 2010-03-13 NOTE — Letter (Signed)
Summary: [facility]  [facility]   Imported By: Sherian Rein 02/28/2010 09:28:22  _____________________________________________________________________  External Attachment:    Type:   Image     Comment:   External Document

## 2010-03-13 NOTE — Progress Notes (Signed)
Summary: Triage  Phone Note Call from Patient Call back at Work Phone [phone]   Caller: Patient Call For: Dr. Arlyce Dice Reason for Call: Talk to Nurse Summary of Call: pt had an endo last Thurs. 08-08-09 still having some soreness in abd, bloating, gas Initial call taken by: Karna Christmas,  August 13, 2009 9:09 AM  Follow-up for Phone Call        Pt. has same symptoms  as prior to endo.Is using Maalox without relief.Advised to get OTC gas x or Phazyme and increase PPI to b.i.d. and take properly (one half hr. prior to breakfast and supper) as he says symptoms keeping him awake at night. Told Dr.Maria Gallicchio will return to office tomorrow and I will send this to him and we will call him back with any addt'l orders. Follow-up by: Teryl Lucy RN,  August 13, 2009 10:39 AM  Additional Follow-up for Phone Call Additional follow up Details #1::        If no better he should hold gleevac for 3-4 days Additional Follow-up by: Louis Meckel MD,  August 15, 2009 8:26 AM    Additional Follow-up for Phone Call Additional follow up Details #2::    Message left for pt. with above MD instructions. Pt. instructed to call back as needed.  Follow-up by: Laureen Ochs LPN,  August 16, 1306 10:06 AM

## 2010-03-13 NOTE — Letter (Signed)
Summary: [facility] Surgical Clearance  [facility] Surgical Clearance   Imported By: Roderic Ovens 05/10/2009 11:00:26  _____________________________________________________________________  External Attachment:    Type:   Image     Comment:   External Document  Appended Document: [facility] Surgical Clearance Seen in preop visit.  TS

## 2010-03-13 NOTE — Procedures (Signed)
Summary: Upper Endoscopy  Patient: [name] Note: All result statuses are Final unless otherwise noted.  Tests: (1) Upper Endoscopy (EGD)   EGD Upper Endoscopy       DONE     [facility]     [address]     [place], [place]  [address]           ENDOSCOPY PROCEDURE REPORT           PATIENT:  [name], [name]  MR#:  [id number]     BIRTHDATE:  [birth year], 69 yrs. old  GENDER:  male           ENDOSCOPIST:  Barbette Hair. Arlyce Dice, MD     Referred by:           PROCEDURE DATE:  08/08/2009     PROCEDURE:  EGD with biopsy, Maloney Dilation of Esophagus     ASA CLASS:  Class III     INDICATIONS:  chest pain           MEDICATIONS:   Fentanyl 50 mcg IV, Versed 4 mg IV, glycopyrrolate     (Robinal) 0.2 mg IV, 0.6cc simethancone 0.6 cc PO     TOPICAL ANESTHETIC:  Exactacain Spray           DESCRIPTION OF PROCEDURE:   After the risks benefits and     alternatives of the procedure were thoroughly explained, informed     consent was obtained.  The [facility] GIF-H180 [id number] endoscope was     introduced through the mouth and advanced to the third portion of     the duodenum, without limitations.  The instrument was slowly     withdrawn as the mucosa was fully examined.     <<PROCEDUREIMAGES>>           Moderate gastritis was found in the body and the antrum of the     stomach. Mild to moderate erythema and edema. Bxs taken (see     image2 and image3).  A stricture was found at the gastroesophageal     junction. Early esophageal stricture. Dilation with maloney     dilator 18mm Minimal resistance; no heme  Otherwise the     examination was normal.    Retroflexed views revealed no     abnormalities.    The scope was then withdrawn from the patient     and the procedure completed.           COMPLICATIONS:  None           ENDOSCOPIC IMPRESSION:     1) Moderate gastritis in the body and the antrum of the stomach           2) Stricture at the gastroesophageal junction - s/p maloney  dilitation     3) Otherwise normal examination     RECOMMENDATIONS:     1) continue current medications     2) Call office next 2-3 days to schedule an office appointment     for 3 weeks     3)  call office immediately for recurrent, severe abdominal/chest     pain -  forSTAT HIDA scan           REPEAT EXAM:  No           ______________________________     Barbette Hair. Arlyce Dice, MD           CC:  Minus Breeding, MD  n.     eSIGNED:   Barbette Hair. Maximum Reiland at 08/08/2009 02:37 PM           Rens, Francis Dowse, [id number]  Note: An exclamation mark (!) indicates a result that was not dispersed into the flowsheet. Document Creation Date: 08/08/2009 2:38 PM _______________________________________________________________________  (1) Order result status: Final Collection or observation date-time: 08/08/2009 14:30 Requested date-time:  Receipt date-time:  Reported date-time:  Referring Physician:   Ordering Physician: Melvia Heaps [phone]) Specimen Source:  Source: Launa Grill Order Number: [id number] Lab site:

## 2010-03-13 NOTE — Letter (Signed)
Summary: [facility]   Imported By: Sherian Rein 01/30/2010 09:02:08  _____________________________________________________________________  External Attachment:    Type:   Image     Comment:   External Document

## 2010-03-13 NOTE — Progress Notes (Signed)
Summary: clearance for surgery 3/24  Phone Note Call from Patient Call back at Home Phone [phone]   Caller: Spouse Reason for Call: Talk to Nurse Details for Reason: Per pt calling, trying to get clearance . surgery on 3/24  Initial call taken by: Lorne Skeens,  April 22, 2009 10:17 AM  Follow-up for Phone Call        Left message to call back. Julieta Gutting, RN, BSN  April 23, 2009 9:04 AM  The pt was in the office today with his wife and I spoke with him about needing surgical clearance.  The pt's surgery is actually not scheduled at this time.  I made the pt aware that Dr Riley Kill will not be in the office again until 04/30/09.  I told the pt that I had to rearrange this schedule to try and get him in before the 24th.  The pt already has an appt on 05/02/09 with Dr Riley Kill but he is trying to have surgery that day.  I will attempt to get the pt into the office on 04/30/09. Follow-up by: Julieta Gutting, RN, BSN,  April 23, 2009 11:44 AM  Additional Follow-up for Phone Call Additional follow up Details #1::        The pt called back and said he would keep his scheduled appt with Dr Riley Kill on 05/02/09 to discuss surgical clearance.   Additional Follow-up by: Julieta Gutting, RN, BSN,  April 23, 2009 12:26 PM

## 2010-03-13 NOTE — Progress Notes (Signed)
Summary: Records received from Dr. Truett Perna  Records received from Dr. Kalman Drape office. Records forwarded to [facility] Wilder Glade  April 24, 2009 12:06 PM

## 2010-03-13 NOTE — Assessment & Plan Note (Signed)
Summary: eye partially closed/?antibitoic/eye md said see pcp/cc   Vital Signs:  Patient profile:   70 year old male Height:      66.5 inches (168.91 cm) Weight:      191 pounds (86.82 kg) BMI:     30.48 O2 Sat:      96 % on Room air Temp:     98.8 degrees F (37.11 degrees C) oral Pulse rate:   60 / minute Pulse rhythm:   regular BP sitting:   110 / 68  (left arm) Cuff size:   large  Vitals Entered By: Brenton Grills CMA Duncan Dull) (December 30, 2009 3:03 PM)  O2 Flow:  Room air CC: Right eye partically closed/pt is no longer taking Maalox/aj Is Patient Diabetic? No   Referring Provider:  na Primary Provider:  Romero Belling, MD  CC:  Right eye partically closed/pt is no longer taking Maalox/aj.  History of Present Illness: 2 months of moderate weakness of the right eyelid.  he now has a few days of pain posterior to the right ear, and at the adjacent neck.  he has appt with dr love, but that is not until january.  Current Medications (verified): 1)  Gleevec 400 Mg Tabs (Imatinib Mesylate) .... Qd 2)  Lorazepam 0.5 Mg Tabs (Lorazepam) .... Take 1 At Bedtime As Needed 3)  Toprol Xl 25 Mg Xr24h-Tab (Metoprolol Succinate) .... Take 1 By Mouth Qd 4)  Nexium 40 Mg Cpdr (Esomeprazole Magnesium) .... Take 1 By Mouth Qd 5)  Plavix 75 Mg Tabs (Clopidogrel Bisulfate) .... Take 1 By Mouth Qd 6)  Imdur 30 Mg Xr24h-Tab (Isosorbide Mononitrate) .... Take 1 By Mouth Qd 7)  Promethazine Hcl 25 Mg Tabs (Promethazine Hcl) .... As Needed 8)  Multivitamins  Tabs (Multiple Vitamin) .... Once Daily 9)  Crestor 10 Mg Tabs (Rosuvastatin Calcium) .... Take As Directed 10)  Lisinopril 5 Mg Tabs (Lisinopril) .Marland Kitchen.. 1 Once Daily 11)  Diphenoxylate-Atropine 2.5-0.025 Mg Tabs (Diphenoxylate-Atropine) .... As Needed 12)  Imodium A-D 2 Mg Tabs (Loperamide Hcl) .... As Needed 13)  Maalox Plus 225-200-25 Mg/66ml Susp (Alum & Mag Hydroxide-Simeth) .... As Needed 14)  Prednisolone Acetate 1 % Susp (Prednisolone  Acetate) .Marland Kitchen.. 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         he has slight pain posterior to the right ear.    Physical Exam  General:  normal appearance.   Head:  head: no deformity eyes: no periorbital swelling, no proptosis external nose and ears are normal mouth: no lesion seen Eyes:  there is right ptosis   Impression & Recommendations:  Problem # 1:  PTOSIS (ICD-374.30) Assessment New ? MG  Problem # 2:  NECK PAIN (ICD-723.1) Assessment: New high risk of early zoster  Medications Added to Medication List This Visit: 1)  Prednisolone Acetate 1 % Susp (Prednisolone acetate) .Marland Kitchen.. 1 drop in each eye two times a day 2)  Valacyclovir Hcl 1 Gm Tabs (Valacyclovir hcl) .Marland Kitchen.. 1 tab three times a day 3)  Pyridostigmine Bromide 60 Mg Tabs (Pyridostigmine bromide) .Marland Kitchen.. 1 tab three times a day  Other Orders: Est. Patient Level IV ([address])  Patient Instructions: 1)  you neck symptoms could be the first sign of shingles.  therefore, take valacyclovir 1000 mg three times a day, x 7 days. 2)  try "mestinon" 60 mg three times a day, to see if it helps your eye symptoms. 3)  see dr love as scheduled in january. Prescriptions: PYRIDOSTIGMINE BROMIDE 60 MG TABS (PYRIDOSTIGMINE BROMIDE) 1 tab three times a day  #90 x 0   Entered and Authorized by:   Minus Breeding MD    Signed by:   Minus Breeding MD on 12/30/2009   Method used:   Electronically to        [facility]. [phone]* (retail)       [address]       [place], [place]  [address]       Ph: [phone] or [phone]       Fax: [phone]   RxID:   [id number] VALACYCLOVIR HCL 1 GM TABS (VALACYCLOVIR HCL) 1 tab three times a day  #21 x 0   Entered and Authorized by:   Minus Breeding MD   Signed by:   Minus Breeding MD on 12/30/2009   Method used:   Electronically to        [facility]. [phone]* (retail)       [address]       [place], [place]  [address]       Ph: [phone] or [phone]       Fax: [phone]   RxID:   [id number]    Orders Added: 1)  Est. Patient Level IV [[address]]

## 2010-03-13 NOTE — Progress Notes (Signed)
Summary: Plavix letter-Endoscopy Procedure  Phone Note From Other Clinic Call back at Home Phone [phone]   Caller: pam from [facility] ext [phone] Request: Talk with Nurse Summary of Call: surgery on 6/30 with dr. Francis Dowse letter faxed over to dr. Riley Kill-  plavix letter.  Initial call taken by: Lorne Skeens,  August 01, 2009 3:03 PM  Follow-up for Phone Call        I spoke with the pt and made him aware that he can hold his plavix 7 days prior to GI procedure.  Follow-up by: Julieta Gutting, RN, BSN,  August 02, 2009 7:01 PM

## 2010-03-13 NOTE — Progress Notes (Signed)
Summary: Diarrhea  Phone Note Call from Patient Call back at Work Phone [phone]   Caller: Patient Summary of Call: pt called stating that he has had diarrhea x 3 days. He was recently started on Lisinopril 5mg  and is concerned that this could be a side effect. Pt is requesting RX for diarrhea to pharmacy. Initial call taken by: Margaret Pyle, CMA,  April 30, 2009 2:05 PM  Follow-up for Phone Call        try immodium (non-prescription) for diarrhea.  try stopping lisinopril until diarrhea gets better.  when it does, try restarting, because it is usually a coincidence. Follow-up by: Minus Breeding MD,  April 30, 2009 2:25 PM  Additional Follow-up for Phone Call Additional follow up Details #1::        pt states he has been using Immodium since sys started, with no relief. please advise Additional Follow-up by: Margaret Pyle, CMA,  April 30, 2009 3:11 PM    Additional Follow-up for Phone Call Additional follow up Details #2::    another option is kaopectate.  also take a clear liquid diet, and come for a dr appointment if not better in a few days. Follow-up by: Minus Breeding MD,  April 30, 2009 3:42 PM  Additional Follow-up for Phone Call Additional follow up Details #3:: Details for Additional Follow-up Action Taken: pt informed Additional Follow-up by: Margaret Pyle, CMA,  April 30, 2009 3:45 PM

## 2010-03-13 NOTE — Assessment & Plan Note (Signed)
Summary: Cardiology Nuclear Study  Nuclear Med Background Indications for Stress Test: Evaluation for Ischemia, Surgical Clearance  Indications Comments: Pending (L) Rotator cuff repair Ronal Gioffre  History: Angioplasty, Heart Catheterization, Myocardial Perfusion Study  History Comments: '01 Angioplasty '02 MPS EF 63% (-) ischemia [facility] '03 Heart Cath N/O CAD TX med LAD and RCA Glucose Intolerance  Symptoms: Palpitations    Nuclear Pre-Procedure Cardiac Risk Factors: Family History - CAD, Hypertension, Lipids Caffeine/Decaff Intake: None NPO After: 7:00 AM Lungs: clear IV 0.9% NS with Angio Cath: 22g     IV Site: (R) AC IV Started by: Irean Hong RN Chest Size (in) 44     Height (in): 66.5 Weight (lb): 188 BMI: 30.00  Nuclear Med Study 1 or 2 day study:  1 day     Stress Test Type:  Stress Reading MD:  Dietrich Pates, MD     Referring MD:  T.Stuckey Resting Radionuclide:  Technetium 33m Tetrofosmin     Resting Radionuclide Dose:  11 mCi  Stress Radionuclide:  Technetium 79m Tetrofosmin     Stress Radionuclide Dose:  33 mCi   Stress Protocol Exercise Time (min):  8:00 min     Max HR:  133 bpm     Predicted Max HR:  149 bpm  Max Systolic BP: 197 mm Hg     Percent Max HR:  89.26 %     METS: 10.10 Rate Pressure Product:  26201    Stress Test Technologist:  Milana Na EMT-P     Nuclear Technologist:  Domenic Polite CNMT  Rest Procedure  Myocardial perfusion imaging was performed at rest 45 minutes following the intravenous administration of Myoview Technetium 27m Tetrofosmin.  Stress Procedure  The patient exercised for 8:00. The patient stopped due to fatigue and denied any chest pain.  There were non specific  ST-T wave changes and occ pacs.  Myoview was injected at peak exercise and myocardial perfusion imaging was performed after a brief delay.  QPS Raw Data Images:  Normal; no motion artifact; normal heart/lung ratio. Stress Images:  There is normal uptake in  all areas. Rest Images:  Normal homogeneous uptake in all areas of the myocardium. Subtraction (SDS):  No evidence of ischemia. Transient Ischemic Dilatation:  .93  (Normal <1.22)  Lung/Heart Ratio:  .23  (Normal <0.45)  Quantitative Gated Spect Images QGS EDV:  87 ml QGS ESV:  29 ml QGS EF:  66 %   Overall Impression  Exercise Capacity: Good exercise capacity. BP Response: Normal blood pressure response. Clinical Symptoms: No chest pain ECG Impression: No significant ST segment change suggestive of ischemia. Overall Impression: Normal stress nuclear study.  Appended Document: Cardiology Nuclear Study Study is not high risk.  He has significant CAD, but his exercise test is not high risk.  Therefore, recommend continued medical therapy.  He is stable for shoulder surgery which he clearly needs.

## 2010-03-13 NOTE — Progress Notes (Signed)
Summary: refill  Phone Note Refill Request Message from:  Patient on May 16, 2009 9:14 AM  Refills Requested: Medication #1:  PLAVIX 75 MG TABS TAKE 1 by mouth QD Send to [facility] [phone] pt only has four pills left pt want someone to please call in med today [facility] need more information before they will refill medication.  Initial call taken by: Judie Grieve,  May 16, 2009 9:17 AM  Follow-up for Phone Call        Rx called to pharmacy med  will be mailed to pt. between next 7 to 10 days. Message left to pt. Follow-up by: Vikki Ports,  May 16, 2009 2:06 PM

## 2010-03-13 NOTE — Progress Notes (Signed)
Summary: pt in hospital  Phone Note From Other Clinic   Caller: [facility]/Dr Gioffre Summary of Call: Lorain Childes pt is in RM# 1306 @ [facility] Initial call taken by: Migdalia Dk,  May 07, 2009 4:38 PM

## 2010-03-13 NOTE — Letter (Signed)
Summary: Results Letter  [facility]  [address] [place], [place] [address]   Phone: [phone]  Fax: [phone]        August 15, 2009 MRN: [id number]    [facility] [address] [place], [place]  [address]    Dear Mr. Beckum,   Your biopsies demonstrated inflammatory changes only.    Please follow the recommendations previously discussed.  Should you have any immediate concerns or questions, feel free to contact me at the office.    Sincerely,  Barbette Hair. Arlyce Dice, M.D., [facility]          Sincerely,  Louis Meckel MD  This letter has been electronically signed by your physician.  Appended Document: Results Letter letter mailed

## 2010-03-13 NOTE — Consult Note (Signed)
Summary: [facility]   Imported By: Lester [place] 03/05/2009 16:51:25  _____________________________________________________________________  External Attachment:    Type:   Image     Comment:   External Document

## 2010-03-13 NOTE — Letter (Signed)
Summary: [facility]  [facility]   Imported By: Sherian Rein 11/14/2009 11:33:13  _____________________________________________________________________  External Attachment:    Type:   Image     Comment:   External Document

## 2010-03-13 NOTE — Progress Notes (Signed)
Summary: TRIAGE-U/S, Labs & OV Scheduled  Phone Note Call from Patient Call back at Home Phone [phone]   Caller: Patient Call For: Dr. Arlyce Dice Reason for Call: Talk to Nurse Summary of Call: experiencing severe, sharp pain in center, upper abd thaty radiates through to back... pt thinks it's his gallbladder... would like to be seen before next available in August and hopfully before another attack... ok to see Amy again Initial call taken by: Vallarie Mare,  July 30, 2009 8:34 AM  Follow-up for Phone Call        Pt. states, "I am pretty sure I had a gallbladder attack last night"  Had epigastric/back pain, sharp pain, bloating, reflux.  For the last 2 weeks he has some pain after meals, increased gas.   1) See Willette Cluster NP 08-01-09 at 1:30pm 2) Continue Nexium daily 3) Gas-x,Phazyme, etc. as needed for gas & bloating. 4) Soft,bland diet. No spicy,greasy,fried foods.  5) If symptoms become worse call back immediately or go to ER.  DR.JACOBS (DOD) PLEASE REVIEW.  Follow-up by: Laureen Ochs LPN,  July 30, 2009 9:17 AM  Additional Follow-up for Phone Call Additional follow up Details #1::        I don't see that he has had an abd ultrasound in past few years (not in EMR), if he doesn't recall having one in past 5 years he should have one to check for gallstones, cholecystitis (would be nice if this could be before his ROV).  also cbc, cmet today or tomorrow. Additional Follow-up by: Rachael Fee MD,  July 30, 2009 9:39 AM    Additional Follow-up for Phone Call Additional follow up Details #2::    Ultrasound is scheduled at [facility] on 07-31-09 at 9am, NPO after 12mn and pt. will have labs done at [facility] tomorrow. All appt. information given to pt. by phone. Pt. instructed to call back as needed.  Follow-up by: Laureen Ochs LPN,  July 30, 2009 10:30 AM

## 2010-03-13 NOTE — Progress Notes (Signed)
Summary: Nuclear Pre-Procedure  Phone Note From Other Clinic   Summary of Call: The patient was given his instructions after his office visit with T. Stuckey.     Nuclear Med Background Indications for Stress Test: Evaluation for Ischemia, Surgical Clearance  Indications Comments: Pending (L) Rotator cuff repair Ronal Gioffre  History: Angioplasty, Heart Catheterization, Myocardial Perfusion Study  History Comments: '01 Angioplasty '02 MPS EF 63% (-) ischemia [facility] '03 Heart Cath N/O CAD TX med LAD and RCA Glucose Intolerance     Nuclear Pre-Procedure Cardiac Risk Factors: Family History - CAD, Hypertension, Lipids Height (in): 66.5  Nuclear Med Study Referring MD:  T.Stuckey

## 2010-03-17 ENCOUNTER — Other Ambulatory Visit: Payer: Self-pay | Admitting: Neurology

## 2010-03-17 DIAGNOSIS — G7001 Myasthenia gravis with (acute) exacerbation: Secondary | ICD-10-CM

## 2010-03-19 NOTE — Letter (Addendum)
Summary: [facility]  [facility]   Imported By: Sherian Rein 03/14/2010 07:49:28  _____________________________________________________________________  External Attachment:    Type:   Image     Comment:   External Document

## 2010-03-21 ENCOUNTER — Ambulatory Visit: Payer: Self-pay | Admitting: Endocrinology

## 2010-03-24 ENCOUNTER — Ambulatory Visit
Admission: RE | Admit: 2010-03-24 | Discharge: 2010-03-24 | Disposition: A | Discharge status: Home / Self Care | Payer: 59 | Source: Ambulatory Visit | Attending: Neurology | Admitting: Neurology

## 2010-03-24 DIAGNOSIS — G7001 Myasthenia gravis with (acute) exacerbation: Secondary | ICD-10-CM

## 2010-03-24 MED ORDER — IOHEXOL 300 MG/ML  SOLN
75.0000 mL | Freq: Once | INTRAMUSCULAR | Status: AC | PRN
  Administered 2010-03-24: 75 mL via INTRAVENOUS

## 2010-04-04 ENCOUNTER — Ambulatory Visit (INDEPENDENT_AMBULATORY_CARE_PROVIDER_SITE_OTHER): Payer: Medicare Other | Admitting: Internal Medicine

## 2010-04-04 ENCOUNTER — Encounter: Payer: Self-pay | Admitting: Internal Medicine

## 2010-04-04 DIAGNOSIS — I10 Essential (primary) hypertension: Secondary | ICD-10-CM

## 2010-04-04 DIAGNOSIS — J209 Acute bronchitis, unspecified: Secondary | ICD-10-CM

## 2010-04-04 DIAGNOSIS — B370 Candidal stomatitis: Secondary | ICD-10-CM

## 2010-04-08 NOTE — Letter (Signed)
Summary: [facility]: Office Progress Note  [facility]: Office Progress Note   Imported By: Earl Many 03/26/2010 09:11:26  _____________________________________________________________________  External Attachment:    Type:   Image     Comment:   External Document

## 2010-04-08 NOTE — Assessment & Plan Note (Signed)
Summary: post bronchitis/feels lump on the back of tongue/Airika Alkhatib/lb   Vital Signs:  Patient profile:   70 year old male Height:      67 inches Weight:      184.25 pounds BMI:     28.96 O2 Sat:      95 % on Room air Temp:     98.2 degrees F oral Pulse rate:   69 / minute BP sitting:   118 / 62  (left arm) Cuff size:   regular  Vitals Entered By: Zella Ball Ewing CMA (AAMA) (April 04, 2010 2:30 PM)  O2 Flow:  Room air CC: Bump on back of tongue/RE   Primary Care Provider:  Romero Belling, MD  CC:  Bump on back of tongue/RE.  History of Present Illness: here to f/u after recent eval twice in the past 2 wks with infectious URI type symtpoms and persistnet cough diagnosed as bronchitis per pt, now 5 days into a second 10 day course of antibx;  he has devleoped a "popcorn husk" sensation to the post throat, and sister noted a lump to the post 10 tongue of which he is now concerned;  Pt denies CP, worsening sob, doe, wheezing, orthopnea, pnd, worsening LE edema, palps, dizziness or syncope  Pt denies new neuro symptoms such as headache, facial or extremity weakness  Pt denies polydipsia, polyuria  Overall good compliance with meds, trying to follow low chol diet, wt stable, little excercise however .  Has seen neuro with recent new dx myasthenia gravis, as well as hx of CML.  No fever, wt loss, night sweats, loss of appetite or other constitutional symptoms  Overall good compliance with meds, and good tolerability.  Denies worsening depressive symptoms, suicidal ideation, or panic.    Problems Prior to Update: 1)  Thrush  (ICD-112.0) 2)  Bronchitis-acute  (ICD-466.0) 3)  Skin Rash  (ICD-782.1) 4)  Neck Pain  (ICD-723.1) 5)  Ptosis  (ICD-374.30) 6)  Diarrhea  (ICD-787.91) 7)  Nausea and Vomiting  (ICD-787.01) 8)  Early Satiety  (ICD-780.94) 9)  Abdominal Pain-epigastric  (ICD-789.06) 10)  Uri  (ICD-465.9) 11)  Hypertension  (ICD-401.9) 12)  Wrist Pain, Right  (ICD-719.43) 13)  Hand Pain,  Right  (ICD-729.5) 14)  Coronary Artery Disease  (ICD-414.00) 15)  Hyperlipidemia, Mixed  (ICD-272.2) 16)  Glucose Intolerance, Hx of  (ICD-V12.2) 17)  Abdominal Pain-periumbilical  (ICD-789.05) 18)  Abdominal Pain Other Specified Site  (ICD-789.09) 19)  Anal or Rectal Pain  (ICD-569.42) 20)  Diverticulosis-colon  (ICD-562.10) 21)  Personal Hx Colonic Polyps  (ICD-V12.72) 22)  Hemorrhoids-internal  (ICD-455.0) 23)  Hemorrhoids-external  (ICD-455.3) 24)  Rash-nonvesicular  (ICD-782.1) 25)  Pneumonia  (ICD-486) 26)  Conjunctivitis  (ICD-372.30) 27)  Nephrolithiasis, Hx of  (ICD-V13.01) 28)  Leukemia, Myeloid, 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 Qd 2)  Lorazepam 0.5 Mg Tabs (Lorazepam) .... Take 1 At Bedtime As Needed 3)  Toprol Xl 25 Mg Xr24h-Tab (Metoprolol Succinate) .... Take 1 By Mouth Qd 4)  Nexium 40 Mg Cpdr (Esomeprazole Magnesium) .... Take 1 By Mouth Qd 5)  Plavix 75 Mg Tabs (Clopidogrel Bisulfate) .... Take 1 By Mouth Qd 6)  Imdur 30 Mg Xr24h-Tab (Isosorbide Mononitrate) .... Take 1 By Mouth Qd 7)  Promethazine Hcl 25 Mg Tabs (Promethazine Hcl) .... As Needed 8)  Multivitamins  Tabs (Multiple Vitamin) .... Once Daily 9)  Crestor 10 Mg Tabs (Rosuvastatin Calcium) .... Take As Directed 10)  Lisinopril 5 Mg Tabs (Lisinopril) .Marland Kitchen.. 1 Once Daily 11)  Diphenoxylate-Atropine 2.5-0.025 Mg Tabs (Diphenoxylate-Atropine) .... As Needed 12)  Imodium A-D 2 Mg Tabs (Loperamide Hcl) .... As Needed 13)  Pyridostigmine Bromide 60 Mg Tabs (Pyridostigmine Bromide) .Marland Kitchen.. 1 Tab Three Times A Day 14)  Ondansetron Hcl 4 Mg Tabs (Ondansetron Hcl) .Marland Kitchen.. 1 Tablet Every 6 Hours As Needed 15)  Triamcinolone Acetonide 0.5 % Crea (Triamcinolone Acetonide) .... Three Times A Day As Needed For Rash  Current Medications (verified): 1)  Gleevec 400 Mg Tabs (Imatinib Mesylate) .... Qd 2)  Lorazepam 0.5 Mg Tabs (Lorazepam) .... Take 1 At Bedtime  As Needed 3)  Toprol Xl 25 Mg Xr24h-Tab (Metoprolol Succinate) .... Take 1 By Mouth Qd 4)  Nexium 40 Mg Cpdr (Esomeprazole Magnesium) .... Take 1 By Mouth Qd 5)  Plavix 75 Mg Tabs (Clopidogrel Bisulfate) .... Take 1 By Mouth Qd 6)  Imdur 30 Mg Xr24h-Tab (Isosorbide Mononitrate) .... Take 1 By Mouth Qd 7)  Promethazine Hcl 25 Mg Tabs (Promethazine Hcl) .... As Needed 8)  Multivitamins  Tabs (Multiple Vitamin) .... Once Daily 9)  Crestor 10 Mg Tabs (Rosuvastatin Calcium) .... Take As Directed 10)  Lisinopril 5 Mg Tabs (Lisinopril) .Marland Kitchen.. 1 Once Daily 11)  Diphenoxylate-Atropine 2.5-0.025 Mg Tabs (Diphenoxylate-Atropine) .... As Needed 12)  Imodium A-D 2 Mg Tabs (Loperamide Hcl) .... As Needed 13)  Pyridostigmine Bromide 60 Mg Tabs (Pyridostigmine Bromide) .Marland Kitchen.. 1 Tab Three Times A Day 14)  Ondansetron Hcl 4 Mg Tabs (Ondansetron Hcl) .Marland Kitchen.. 1 Tablet Every 6 Hours As Needed 15)  Triamcinolone Acetonide 0.5 % Crea (Triamcinolone Acetonide) .... Three Times A Day As Needed For Rash 16)  Nystatin 100000 Unit/ml Susp (Nystatin) .... 5 Cc By Mouth Swish and Spit Four Times Per Day For 10 Days As Needed  Allergies (verified): 1)  Percocet (Oxycodone-Acetaminophen) 2)  Sulfamethoxazole (Sulfamethoxazole)  Past History:  Past Medical History: Last updated: 08/01/2009 CORONARY ARTERY DISEASE (ICD-414.00) HYPERLIPIDEMIA, MIXED (ICD-272.2) GLUCOSE INTOLERANCE, HX OF (ICD-V12.2)9) DIVERTICULOSIS-COLON (ICD-562.10) PERSONAL HX COLONIC POLYPS (ICD-V12.72) HEMORRHOIDS-INTERNAL (ICD-455.0) HEMORRHOIDS-EXTERNAL (ICD-455.3) RASH-NONVESICULAR (ICD-782.1) PNEUMONIA (ICD-486) CONJUNCTIVITIS (ICD-372.30) NEPHROLITHIASIS, HX OF (ICD-V13.01) LEUKEMIA, MYELOID, CHRONIC (ICD-205.10)  Past Surgical History: Last updated: 10/05/2008 Angioplasty 2001 Back Surgery x 2 Tonsillectomy  Social History: Last updated: 10/05/2008 Married Retired Patient has never smoked.  Alcohol Use - no Daily Caffeine  Use Illicit Drug Use - no  Risk Factors: Smoking Status: never (07/02/2008)  Review of Systems       all otherwise negative per pt -    Physical Exam  General:  alert and overweight-appearing.  , not ill appearing Head:  normocephalic and atraumatic.   Eyes:  vision grossly intact, pupils equal, and pupils round. with no evidence for lid lag or drooping   Ears:  R ear normal.  and left tm mild erythema, canals clear , sinus nontender Nose:  nasal dischargemucosal pallor and mucosal edema.   Mouth:  pharyngeal erythema and fair dentition.  ,uvula 1+ swollen and  erythema, with mild swollen tongue papillae at base of tongue as well;  no tonsil or other pharyngeal mass;  tongue with whitish coating Neck:  supple and no masses.   Lungs:  normal respiratory effort and normal breath sounds.   Heart:  normal rate and regular rhythm.   Extremities:  no edema, no erythema    Impression & Recommendations:  Problem # 1:  BRONCHITIS-ACUTE (ICD-466.0)  improved - ok to hold further antibx at this time   Encouraged to push  clear liquids, get enough rest, and take acetaminophen as needed. To be seen in 5-7 days if no improvement, sooner if worse.  Problem # 2:  THRUSH (ICD-112.0) for nystatin asd ;  no pharyngeal mass - I suspect the "mass" on the tongue was swollen papillae, consider ENT if not improved  Problem # 3:  HYPERTENSION (ICD-401.9)  His updated medication list for this problem includes:    Toprol Xl 25 Mg Xr24h-tab (Metoprolol succinate) .Marland Kitchen... Take 1 by mouth qd    Lisinopril 5 Mg Tabs (Lisinopril) .Marland Kitchen... 1 once daily  BP today: 118/62 Prior BP: 114/68 (02/21/2010)  Labs Reviewed: K+: 4.5 (07/31/2009) Creat: : 1.2 (07/31/2009)   Chol: 141 (04/24/2009)   HDL: 41.10 (04/24/2009)   LDL: 79 (04/24/2009)   TG: 105.0 (04/24/2009) stable overall by hx and exam, ok to continue meds/tx as is   Complete Medication List: 1)  Gleevec 400 Mg Tabs (Imatinib mesylate) .... Qd 2)   Lorazepam 0.5 Mg Tabs (Lorazepam) .... Take 1 at bedtime as needed 3)  Toprol Xl 25 Mg Xr24h-tab (Metoprolol succinate) .... Take 1 by mouth qd 4)  Nexium 40 Mg Cpdr (Esomeprazole magnesium) .... Take 1 by mouth qd 5)  Plavix 75 Mg Tabs (Clopidogrel bisulfate) .... Take 1 by mouth qd 6)  Imdur 30 Mg Xr24h-tab (Isosorbide mononitrate) .... Take 1 by mouth qd 7)  Promethazine Hcl 25 Mg Tabs (Promethazine hcl) .... As needed 8)  Multivitamins Tabs (Multiple vitamin) .... Once daily 9)  Crestor 10 Mg Tabs (Rosuvastatin calcium) .... Take as directed 10)  Lisinopril 5 Mg Tabs (Lisinopril) .Marland Kitchen.. 1 once daily 11)  Diphenoxylate-atropine 2.5-0.025 Mg Tabs (Diphenoxylate-atropine) .... As needed 12)  Imodium A-d 2 Mg Tabs (Loperamide hcl) .... As needed 13)  Pyridostigmine Bromide 60 Mg Tabs (Pyridostigmine bromide) .Marland Kitchen.. 1 tab three times a day 14)  Ondansetron Hcl 4 Mg Tabs (Ondansetron hcl) .Marland Kitchen.. 1 tablet every 6 hours as needed 15)  Triamcinolone Acetonide 0.5 % Crea (Triamcinolone acetonide) .... Three times a day as needed for rash 16)  Nystatin 100000 Unit/ml Susp (Nystatin) .... 5 cc by mouth swish and spit four times per day for 10 days as needed  Patient Instructions: 1)  Please take all new medications as prescribed 2)  stop the antibiotic 3)  Continue all previous medications as before this visit  4)  Please keep your appts with Dr Everardo All and Neurology as planned Prescriptions: NYSTATIN 100000 UNIT/ML SUSP (NYSTATIN) 5 cc by mouth swish and spit four times per day for 10 days as needed  #1 bottle x 1   Entered and Authorized by:   Corwin Levins MD   Signed by:   Corwin Levins MD on 04/04/2010   Method used:   Electronically to        [facility]. [phone]* (retail)       [address]       [place], [place]  [address]       Ph: [phone] or [phone]       Fax: [phone]   RxID:   [id number]    Orders Added: 1)  Est. Patient Level IV  [66440]

## 2010-04-18 ENCOUNTER — Encounter: Payer: Self-pay | Admitting: Endocrinology

## 2010-05-05 LAB — URINALYSIS, ROUTINE W REFLEX MICROSCOPIC
Bilirubin Urine: NEGATIVE
Glucose, UA: NEGATIVE mg/dL
Hgb urine dipstick: NEGATIVE
Ketones, ur: NEGATIVE mg/dL
Nitrite: NEGATIVE
Specific Gravity, Urine: 1.024 (ref 1.005–1.030)
pH: 6 (ref 5.0–8.0)

## 2010-05-05 LAB — PROTIME-INR
INR: 1 (ref 0.00–1.49)
Prothrombin Time: 13.1 seconds (ref 11.6–15.2)

## 2010-05-05 LAB — DIFFERENTIAL
Basophils Relative: 1 % (ref 0–1)
Eosinophils Absolute: 0.3 10*3/uL (ref 0.0–0.7)
Lymphs Abs: 1.3 10*3/uL (ref 0.7–4.0)
Neutro Abs: 3.4 10*3/uL (ref 1.7–7.7)
Neutrophils Relative %: 60 % (ref 43–77)

## 2010-05-05 LAB — COMPREHENSIVE METABOLIC PANEL
ALT: 32 U/L (ref 0–53)
Alkaline Phosphatase: 60 U/L (ref 39–117)
BUN: 9 mg/dL (ref 6–23)
CO2: 31 mEq/L (ref 19–32)
Calcium: 9 mg/dL (ref 8.4–10.5)
GFR calc non Af Amer: >60 mL/min (ref >60)
Glucose, Bld: 96 mg/dL (ref 70–99)
Sodium: 139 mEq/L (ref 135–145)
Total Protein: 6.8 g/dL (ref 6.0–8.3)

## 2010-05-05 LAB — CBC
HCT: 41.3 % (ref 39.0–52.0)
Hemoglobin: 13.8 g/dL (ref 13.0–17.0)
MCHC: 33.4 g/dL (ref 30.0–36.0)
RBC: 4.31 MIL/uL (ref 4.22–5.81)
RDW: 14.3 % (ref 11.5–15.5)

## 2010-05-05 LAB — APTT: aPTT: 29 seconds (ref 24–37)

## 2010-05-07 ENCOUNTER — Encounter: Payer: Self-pay | Admitting: Cardiology

## 2010-05-08 ENCOUNTER — Encounter: Payer: Self-pay | Admitting: Cardiology

## 2010-05-08 ENCOUNTER — Ambulatory Visit (INDEPENDENT_AMBULATORY_CARE_PROVIDER_SITE_OTHER): Payer: Medicare Other | Admitting: Cardiology

## 2010-05-08 VITALS — BP 130/64 | HR 68 | Resp 18 | Ht 67.0 in | Wt 188.8 lb

## 2010-05-08 DIAGNOSIS — I10 Essential (primary) hypertension: Secondary | ICD-10-CM

## 2010-05-08 DIAGNOSIS — I2510 Atherosclerotic heart disease of native coronary artery without angina pectoris: Secondary | ICD-10-CM

## 2010-05-08 DIAGNOSIS — E782 Mixed hyperlipidemia: Secondary | ICD-10-CM

## 2010-05-08 MED ORDER — LISINOPRIL 5 MG PO TABS: 5.0000 mg | ORAL_TABLET | Freq: Every day | ORAL | Status: DC

## 2010-05-08 NOTE — Patient Instructions (Signed)
Your physician recommends that you continue on your current medications as directed. Please refer to the Current Medication list given to you today.  Your physician wants you to follow-up in: 6 MONTHS. You will receive a reminder letter in the mail two months in advance. If you don't receive a letter, please call our office to schedule the follow-up appointment.  

## 2010-05-11 NOTE — Assessment & Plan Note (Signed)
No recurrent symptoms at this point in time.  Doing well overall.  Tolerating medications at this point.

## 2010-05-11 NOTE — Progress Notes (Signed)
HPI:  Patient is doing well overall.  He had a drooping eye, and was diagnosed with myasthenia gravis.  He now takes Mestinon, and is doing better, but it is just one more thing on top of everything else.  No current chest pain, and overall his functional status is very good at this point in time.    Current Outpatient Prescriptions  Medication Sig Dispense Refill  . clopidogrel (PLAVIX) 75 MG tablet Take 75 mg by mouth daily.        . diphenoxylate-atropine (LOMOTIL) 2.5-0.025 MG per tablet Take by mouth as needed.        Marland Kitchen esomeprazole (NEXIUM) 40 MG capsule Take 40 mg by mouth daily before breakfast.        . imatinib (GLEEVEC) 400 MG tablet Take 400 mg by mouth daily.        . isosorbide mononitrate (IMDUR) 30 MG 24 hr tablet Take 30 mg by mouth daily.        Marland Kitchen lisinopril (PRINIVIL,ZESTRIL) 5 MG tablet Take 1 tablet (5 mg total) by mouth daily.  90 tablet  3  . loperamide (IMODIUM) 2 MG capsule Take 2 mg by mouth as needed.        Marland Kitchen LORazepam (ATIVAN) 0.5 MG tablet Take 0.5 mg by mouth at bedtime as needed.        . metoprolol succinate (TOPROL-XL) 25 MG 24 hr tablet Take 25 mg by mouth daily.        . Multiple Vitamin (MULTIVITAMIN) tablet Take 1 tablet by mouth daily.        . ondansetron (ZOFRAN) 4 MG tablet Take 4 mg by mouth every 6 (six) hours as needed.        . promethazine (PHENERGAN) 25 MG tablet Take 25 mg by mouth as needed.        . pyridostigmine (MESTINON) 60 MG tablet Take 60 mg by mouth 3 (three) times daily.        . rosuvastatin (CRESTOR) 10 MG tablet as directed.        . nystatin (MYCOSTATIN) 100000 UNIT/ML suspension 5 Cc by mouth swish and spit four times per day for 10 days as needed       . triamcinolone (KENALOG) 0.5 % cream Apply topically 3 (three) times daily. As needed for rash.          Allergies  Allergen Reactions  . Oxycodone-Acetaminophen     REACTION: unspecified  . Sulfamethoxazole     REACTION: unspecified    Past Medical History  Diagnosis  Date  . Coronary atherosclerosis of unspecified type of vessel, native or graft   . Mixed hyperlipidemia   . Glucose intolerance (impaired glucose tolerance)     history of  . Diverticulosis of colon (without mention of hemorrhage)   . Hx of colonic polyps   . Internal hemorrhoids without mention of complication   . External hemorrhoids without mention of complication   . Rash and other nonspecific skin eruption   . Pneumonia, organism unspecified   . Conjunctivitis unspecified   . Nephrolithiasis   . 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 Angioplasty 2001  . Back surgery     x2  . Tonsillectomy     No family history on file.  History   Social History  . Marital Status: Married    Spouse Name: N/A    Number of Children: N/A  .  Years of Education: N/A   Occupational History  . retired    Social History Main Topics  . Smoking status: Never Smoker   . Smokeless tobacco: Not on file  . Alcohol Use: No  . Drug Use: No  . Sexually Active: Not on file   Other Topics Concern  . Not on file   Social History Narrative  . No narrative on file    ROS: Please see the HPI.  All other systems reviewed and negative.  PHYSICAL EXAM:  BP 130/64  Pulse 68  Resp 18  Ht 5\' 7"  (1.702 m)  Wt 188 lb 12.8 oz (85.639 kg)  BMI 29.57 kg/m2  General: Well developed, well nourished, in no acute distress. Head:  Normocephalic and atraumatic. Neck: no JVD Lungs: Clear to auscultation and percussion. Heart: Normal S1 and S2.  No murmur, rubs or gallops.  Abdomen:  Normal bowel sounds; soft; non tender; no organomegaly Pulses: Pulses normal in all 4 extremities. Extremities: No clubbing or cyanosis. No edema. Neurologic: Alert and oriented x 3.  EKG:NSR. Non specific ST and T wave abnormality.    ASSESSMENT AND PLAN:

## 2010-05-11 NOTE — Assessment & Plan Note (Signed)
Was near target on lower dose rosuvastatin one year ago.  Will continue on current level.  Time for repeat laboratories.

## 2010-05-11 NOTE — Assessment & Plan Note (Signed)
Current levels controlled at present.

## 2010-06-06 ENCOUNTER — Encounter: Payer: Self-pay | Admitting: Endocrinology

## 2010-06-06 ENCOUNTER — Telehealth: Payer: Self-pay

## 2010-06-06 ENCOUNTER — Ambulatory Visit (INDEPENDENT_AMBULATORY_CARE_PROVIDER_SITE_OTHER): Payer: Medicare PPO | Admitting: Endocrinology

## 2010-06-06 ENCOUNTER — Other Ambulatory Visit: Payer: Medicare PPO

## 2010-06-06 ENCOUNTER — Other Ambulatory Visit (INDEPENDENT_AMBULATORY_CARE_PROVIDER_SITE_OTHER): Payer: Medicare PPO

## 2010-06-06 VITALS — BP 122/72 | HR 66 | Temp 98.6°F | Ht 67.0 in | Wt 181.4 lb

## 2010-06-06 DIAGNOSIS — R197 Diarrhea, unspecified: Secondary | ICD-10-CM

## 2010-06-06 DIAGNOSIS — R110 Nausea: Secondary | ICD-10-CM

## 2010-06-06 LAB — BASIC METABOLIC PANEL
CO2: 29 mEq/L (ref 19–32)
Calcium: 8.6 mg/dL (ref 8.4–10.5)
GFR: 67.78 mL/min (ref >60.00)
Sodium: 139 mEq/L (ref 135–145)

## 2010-06-06 LAB — CBC WITH DIFFERENTIAL/PLATELET
Basophils Relative: 0.4 % (ref 0.0–3.0)
Eosinophils Relative: 2 % (ref 0.0–5.0)
Hemoglobin: 14.5 g/dL (ref 13.0–17.0)
Lymphocytes Relative: 12.1 % (ref 12.0–46.0)
MCHC: 34.4 g/dL (ref 30.0–36.0)
Monocytes Relative: 14.2 % — ABNORMAL HIGH (ref 3.0–12.0)
Neutro Abs: 5 10*3/uL (ref 1.4–7.7)
RBC: 4.5 Mil/uL (ref 4.22–5.81)
WBC: 7 10*3/uL (ref 4.5–10.5)

## 2010-06-06 MED ORDER — PROMETHAZINE HCL 25 MG/ML IJ SOLN
25.0000 mg | Freq: Once | INTRAMUSCULAR | Status: AC
  Administered 2010-06-06: 25 mg via INTRAMUSCULAR

## 2010-06-06 NOTE — Progress Notes (Signed)
  Subjective:    Patient ID: [name], male    DOB: 1940-03-27, 70 y.o.   MRN: [id number]  HPI Pt states few days of slight cramps throughout the abdomen, and assoc n/v/d.  he was rx'ed with cleocin last week for a dental problem, which is better now. Past Medical History  Diagnosis Date  . Coronary atherosclerosis of unspecified type of vessel, native or graft   . Mixed hyperlipidemia   . Glucose intolerance (impaired glucose tolerance)     history of  . Diverticulosis of colon (without mention of hemorrhage)   . Hx of colonic polyps   . Internal hemorrhoids without mention of complication   . External hemorrhoids without mention of complication   . Rash and other nonspecific skin eruption   . Pneumonia, organism unspecified   . Conjunctivitis unspecified   . Nephrolithiasis   . 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 Angioplasty 2001  . Back surgery     x2  . Tonsillectomy     reports that he has never smoked. He does not have any smokeless tobacco history on file. He reports that he does not drink alcohol or use illicit drugs. family history is not on file. Allergies  Allergen Reactions  . Oxycodone-Acetaminophen     REACTION: unspecified  . Sulfamethoxazole     REACTION: unspecified    Review of Systems Denies fever and brbpr    Objective:   Physical Exam GENERAL: no distress ABDOMEN: abdomen is soft, nontender.  no hepatosplenomegaly.   not distended.  There is a self-reducing ventral hernia.    (pt declines hospitalization) Assessment & Plan:  N/v/d, prob viral. Htn, overcontrolled due to n/v/d. dental abscess--cleocin places him at risk for c difficile.

## 2010-06-06 NOTE — Patient Instructions (Addendum)
Do not take lisinopril until you feel better. blood tests, and a stool specimen are being ordered for you today.  please call [phone] to hear your test results. It is critical to give the specimen today.  Call if you cannot do so today.   Take your ondansetron as needed for nausea.   Please return here next week if you are not feeling better. (update: i left message on phone-tree:  rx as we discussed)

## 2010-06-06 NOTE — Telephone Encounter (Signed)
[facility] Triage Call Report Triage Record Num: [id number] Operator: April Finney Patient Name: [name] Call Date & Time: 06/05/2010 10:05:43PM Patient Phone: PCP: Romero Belling Patient Gender: Male PCP Fax : [phone] Patient DOB: 03/22/1940 [facility] Name: [facility] Reason for Call: Wife/[name] calling about vomiting and diarrhea. Having diffiuclty keeping medication down. Has not taken his chemotherapy pill today as Rx. No s/sx of dehydration. He is urinating without difficulty and does not appear concentrated. Afebrile. Has Leukemia and is treated by Dr.Bayard L. Lowell Guitar [phone]. Instructed Wife they need to speak with Dr.Powell's office tonight for further instructions concerning medication and if he needs to be seen tonight. They do have an after hours service but she did not have the number. Called and confirmed service and number given to wife. Care advice given. Protocol(s) Used: Nausea or Vomiting Recommended Outcome per Protocol: Call Provider Immediately Reason for Outcome: Unable to take or keep down prescription medication (heart, respiratory, diabetes, thyroid, antibiotics, birth control pills, corticosteroids) because of nausea/vomiting Care Advice: ~ Speak with provider before next dose of medication is due. ~ SYMPTOM / CONDITION MANAGEMENT ~ IMMEDIATE ACTION See another provider immediately if unable to talk with your provider within 1 hour. Consider the closest urgent care or ED if an office or clinic is not available. Another adult should drive. ~ Vomiting Care Advice: - Do not eat solid foods until vomiting subsides. - Begin taking fluids by sucking on ice chips or popsicles or taking sips of cool clear, nonprescription oral rehydration solution). - Gradually drink larger amounts of these fluids so that you are drinking six to eight 8 oz. (.2 liter) of fluids a day. - Keep activity to a minimum. - After vomiting subsides, eat smaller, more frequent  meals of easily digested foods such as crackers, toast, bananas, rice, cooked cereal, applesauce, broth, baked or mashed potatoes, chicken or [place] without skin. Eat slowly. - Take fluids 30 minutes before or 60 minutes after meals. - Avoid high fat, highly seasoned, high fiber or high sugar content foods. - Avoid extremely hot or cold foods. - Do not take pain medication (such as aspirin, NSAIDs) while nauseated or vomiting. - Consult your provider for advice regarding continuing prescription medication. - Rest as much as possible in a sitting or in a propped lying position. Do not lie flat for at least 2 hours after eating. ~ 06/05/2010 10:40:14PM Page 1 of 1 [email or web address]

## 2010-06-10 ENCOUNTER — Other Ambulatory Visit: Payer: Self-pay | Admitting: Gastroenterology

## 2010-06-13 ENCOUNTER — Ambulatory Visit (INDEPENDENT_AMBULATORY_CARE_PROVIDER_SITE_OTHER): Payer: Medicare PPO

## 2010-06-13 DIAGNOSIS — Z111 Encounter for screening for respiratory tuberculosis: Secondary | ICD-10-CM

## 2010-06-16 LAB — TB SKIN TEST
Induration: 0
TB Skin Test: NEGATIVE mm

## 2010-06-24 NOTE — Assessment & Plan Note (Signed)
[facility]                            CARDIOLOGY OFFICE NOTE   [name], [name]                        MRN:          [id number]  DATE:01/17/2007                            DOB:          1941/02/07    [name] is in for followup.  In general, he has been stable.  He has not  been having any ongoing chest pain or shortness of breath.  He does feel  a little bit weaker but he thinks this is partially related to his  chemotherapy.   His medications include:  1. Toprol-XL 25 mg daily.  2. Plavix 75 mg daily.  3. Multivitamin daily.  4. Isosorbide mononitrate 30 mg daily.  5. Gleevec 400 mg daily.  6. Nexium 40 mg daily.  7. Crestor 10 mg one-half tablet daily.  8. Fenofibrate 200 mg daily.  9. Furosemide p.r.n.  10.Potassium p.r.n.   On physical, blood pressure is 130/70, pulse is 58.  Lung fields are  entirely clear.  The PMI is nondisplaced.  The cardiac rhythm is regular  without a significant murmur.  The carotids were carefully examined and  I cannot appreciate a bruit.  There is no extremity edema.   EKG reveals sinus bradycardia, otherwise within normal limits.   IMPRESSION:  1. Coronary artery disease with prior percutaneous intervention with      diffuse disease.  2. Status post treatment for chronic myelogenous leukemia.  3. Mixed hyperlipidemia on lipid-lowering therapy.  4. Mild glucose intolerance.   PLAN:  1. Return to clinic in 6 months.  2. Continue current medical regimen.     Arturo Morton. Riley Kill, MD, [facility]  Electronically Signed    TDS/MedQ  DD: 01/17/2007  DT: 01/17/2007  Job #: [id number]

## 2010-06-24 NOTE — Assessment & Plan Note (Signed)
[facility]                            CARDIOLOGY OFFICE NOTE   [name], [name]                        MRN:          [id number]  DATE:04/02/2008                            DOB:          September 24, 1940    [name] in for followup.  In general, he has been stable.  About  twice a month, he will have an episode where he gets nauseated,  subsequently has vomiting and diarrhea.  This quickly goes away.  He had  another episode of this on Saturday night.  He gets no chest pain.  He  does not have quite as much energy as he has had in the past, but he has  had absolutely no angina.  He occasionally has a sharp pain in the  medial aspect of his thigh.  He is not quite sure what this is due to.  It is a sudden lengthening sensation.   His current medications include:  1. Toprol-XL 25 mg daily.  2. Plavix 75 mg daily.  3. Multivitamin daily.  4. Isosorbide 30 mg daily.  5. Gleevec 400 mg daily.  6. Nexium 40 mg daily.  7. Crestor 10 mg one-half daily.  8. Furosemide 10 p.r.n.  9. Potassium chloride 20 mEq p.r.n.   On physical, blood pressure is 130/80, the pulse 60.  The lung fields  are clear, and the cardiac rhythm is regular with soft S4 gallop.  Abdomen is soft.  There is no femoral bruit.  The medial aspect of the  thighs shows no obvious adenopathy or major findings.   IMPRESSION:  1. Coronary artery disease, well defined.  2. 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  3. Mixed hyperlipidemia on lipid-lowering therapy, near LDL target.  4. History of borderline glucose intolerance.   PLAN:  1. Return to clinic in 6 months.  2. Lipid and liver profile at that time.     [name]. Riley Kill, MD, [facility]  Electronically Signed    TDS/MedQ  DD: 04/02/2008  DT: 04/02/2008  Job #: [id number]

## 2010-06-24 NOTE — Assessment & Plan Note (Signed)
[facility]                            CARDIOLOGY OFFICE NOTE   [name], [name]                        MRN:          [id number]  DATE:08/25/2007                            DOB:          03/13/1940    HISTORY:  [name] was in for followup.  He really is doing quite well.  He  denies any ongoing chest pain or significant shortness of breath.  He  does play golf once in a while.  His biggest problem is probably related  to the fact that he feels side effects, which he thinks are related to  his Gleevec.  He has had continued followup in the Hematology/Oncology  Clinic.   MEDICATIONS:  1. Toprol-XL 25 mg daily.  2. Plavix 75 mg daily.  3. Multivitamin daily.  4. Isosorbide mononitrate 30 mg daily.  5. Gleevec 400 mg daily.  6. Nexium 40 mg daily.  7. Crestor 10 mg one-half tablet daily.  8. Fenofibrate 200 mg daily.  9. Furosemide 10 mg p.r.n.  10.Potassium chloride 20 mEq p.r.n.   PHYSICAL EXAMINATION:  GENERAL:  He is alert and oriented in no  distress.  VITAL SIGNS:  Blood pressure is 130/72.  The pulse is 60.  RESPIRATORY:  The lung fields are clear.  HEART:  There is not a significant murmur noted.  There is an S4 gallop.   LABORATORY DATA:  Recent laboratory data include a BUN of 14, creatinine  1.6, and potassium 3.8.  Liver functions are within normal limits.  Alkaline phosphatase is slightly low at 38.  Total cholesterol was 112,  triglycerides 85, HDL was low at 26.8, and total LDL cholesterol was 68.   Electrocardiogram demonstrates normal sinus rhythm.  There is minor  nonspecific T-wave abnormality.   IMPRESSION:  1. Coronary artery disease with prior percutaneous intervention with      known diffuse disease on last catheterization, March 17, 2001.  2. Chronic myelogenous leukemia, in hematologic remission, on Gleevec.  3. Mixed hyperlipidemia, on lipid-lowering therapy, near LDL target.  4. History of glucose intolerance.   PLAN:  1. Return to the clinic in 6 months.  2. Continue current medical regimen.     Arturo Morton. Riley Kill, MD, [facility]  Electronically Signed    TDS/MedQ  DD: 08/26/2007  DT: 08/26/2007  Job #: [id number]

## 2010-06-24 NOTE — Assessment & Plan Note (Signed)
[facility]                            CARDIOLOGY OFFICE NOTE   Tristan, [name]                        MRN:          [id number]  DATE:07/19/2006                            DOB:          [birth year]    [name] is in for a 6 month followup examination.  In general he has  been doing relatively well.  He was seen in the [facility] Emergency Room  recently with some abdominal pain and diarrhea.  He is on medicines for  his leukemia, but has been seen by Dr. Truett Perna and is about to see Dr.  Lowell Guitar at [facility].  He has had no chest pain whatsoever and his  lipid profile in December was reasonably good with an LDL of well under  70.   MEDICATIONS:  1. Toprol XL 25 mg daily.  2. Plavix 75 mg daily.  3. Multivitamin daily.  4. Isosorbide 30 mg daily.  5. Gleevec 400 mg daily.  6. Nexium 40 mg daily.  7. Crestor 10 mg 1/2 tablet daily.  8. Fenofibrate 200 mg one daily.  9. Mylanta p.r.n.   PHYSICAL EXAMINATION:  Is alert and oriented, in no distress.  Weight is  190, which is down 2 pounds form his December visit.  Blood pressure is  120/70, pulse is 56.  The lung fields are actually quite clear.  The PMI  is nondisplaced, there is a normal first and second heart sound without  murmurs, rubs, or gallops.  The extremities reveal no edema.   Electrocardiogram reveals normal sinus rhythm/sinus bradycardia,  otherwise normal EKG.   IMPRESSION:  1. Coronary artery disease with well-documented findings on prior      cardiac catheterization of 2003.  2. Mixed hyperlipidemia on lipid-lowering therapy.  3. Mild glucose intolerance.  4. Chronic myelocytic leukemia, in remission.   PLAN:  1. Return to clinic in 6 months.  2. Continue current medical regimen.  3. Continued followup with Dr. Oliver [name].     [name]. Tristan Kill, MD, [facility]  Electronically Signed    TDS/MedQ  DD: 07/19/2006  DT: 07/19/2006  Job #: [id number]   cc:   Tristan Levins, MD

## 2010-06-24 NOTE — Assessment & Plan Note (Signed)
[facility]                         GASTROENTEROLOGY OFFICE NOTE   LYCAN, DAVEE                        MRN:          [id number]  DATE:07/12/2006                            DOB:          01/21/1941    PROBLEM:  Abdominal pain.   Mr. Vanatta has returned for reevaluation.  Approximately 2 weeks ago he  was seen in the emergency room for what sounds like a gastroenteritis.  He had nausea, vomiting, and diarrhea.  He was treated with an injection  and IV hydration.  Since that time his symptoms have subsided, though he  is complaining of some lower abdominal discomfort.  There is a fullness  and an urge to go, though he is only having soft bowel movements.  He is  without fever.   He has had intermittent nausea.  He has a history of gastroparesis for  which he was taking low-dose Reglan.  He has since discontinued this.   OTHER MEDICATIONS:  1. Toprol XL.  2. Plavix.  3. Isosorbide.  4. Gleevec.  5. Nexium.  6. Crestor.  7. Fenofibrate.  8. Mylanta.   PHYSICAL EXAMINATION:  Pulse 62, blood pressure 139/72, weight 189.  ABDOMEN:  Without masses, tenderness, or organomegaly.   IMPRESSION:  1. Nonspecific abdominal discomfort.  This is probably a residual from      this gastroenteritis.  2. Gastroparesis, currently minimally symptomatic.   RECOMMENDATIONS:  NuLev 0.25 mg sublingual q.4 h. p.r.n.   I carefully instructed Mr. Kinter to call me in 3-4 days if his symptoms  do not improve or he does not respond to anticholinergics.     Barbette Hair. Arlyce Dice, MD,FACG  Electronically Signed    RDK/MedQ  DD: 07/12/2006  DT: 07/12/2006  Job #: [id number]   cc:   Leighton Roach. Truett Perna, M.D.

## 2010-06-27 NOTE — Discharge Summary (Signed)
. [facility]  Patient:    [name], [name] Visit Number: [id number] MRN: [id number]          Service Type: MED Location: [phone] Attending Physician:  Ronaldo Miyamoto Dictated by:   Brita Romp, P.A. Admit Date:  03/16/2001 Discharge Date: 03/19/2001   CC:         Stefani Dama, M.D.   Discharge Summary  DISCHARGE DIAGNOSES: 1. Coronary artery disease, status post cardiac catheterization. 2. Hyperlipidemia. 3. History of diverticulosis. 4. Status post tonsillectomy. 5. Status post back surgery.  HISTORY OF PRESENT ILLNESS:  [name] is a 70 year old male with known coronary artery disease.  He presented complaining of intermittent substernal chest pain for approximately one month.  On the day of admission, he had an episode of jaw pain.  On presentation to the emergency room, the pain was better but not fully relieved.  HOSPITAL COURSE:  The patient was seen and admitted by E. Graceann Congress, M.D. He felt that the patients symptoms were consistent with an acute coronary syndrome and planned for coronary angiography.  The next day, the patient was taken to the catheterization lab by Jonelle Sidle, M.D.  Catheterization results:  1. Left main coronary artery:  A 20% mid lesion.  2. Left anterior descending:  Total ______ lesion of the proximal to mid vessel; 30% distally; 50-60% towards the apex.  3. Circumflex: A 20% lesion in the proximal portion of the obtuse marginal branch.  4. Right coronary artery:  30% distally followed by 95% in a small a branch of the PDA territory.  5. Left ventriculogram:  Ejection fraction 60-65%; no wall motion abnormalities; no mitral regurgitation and lift.  Jonelle Sidle, M.D., recommended starting Imdur.  He suggested a Cardiolite if symptoms were not controlled with medications.  The next day, the patient was seen by Arturo Morton. Riley Kill, M.D.  The patient had a mild episode of chest  pain and was started on Plavix.  The following day, the patient denied any further chest pain or shortness of breath.  His groin was stable without bruit or ecchymosis with a small hematoma.  He was felt to be stable for discharge.  DISCHARGE MEDICATIONS: 1. Lopid 600 mg b.i.d. 2. Folic acid 1 mg q.d. 3. Prilosec 20 mg q.d. 4. Toprol XL 25 mg q.d. 5. Enteric-coated aspirin 325 mg q.d. 6. Imdur 30 mg q.p.m. 7. Plavix 75 mg q.d.  LABORATORY DATA:  Sodium 140, potassium 4.2, chloride 106, CO2 28, BUN 11, creatinine 1.2, glucose 129.  White count 7.3, hemoglobin 13.9, hematocrit 39.8, platelets 258.  Serial cardiac enzymes were negative for MI. Liver function tests were all within normal limits.  ACTIVITY:  The patient is to avoid driving, heavy lifting, or tub baths for two days.  DIET:  He is to eat a low-fat, low-cholesterol diet.  WOUND CARE:  He is to watch the catheterization for any pain, bleeding, or swelling and called the [facility] offices with these problems.  FOLLOW-UP:  He is to follow up with a P.A. visit in the office on March 28, 2001, at 9 a.m.  He is to keep his scheduled appointment with Arturo Morton. Riley Kill, M.D., on May 05, 2001, at 10:15 a.m.  He is to follow up with Stefani Dama, M.D., as needed or scheduled. Dictated by:   Brita Romp, P.A. Attending Physician:  Ronaldo Miyamoto DD:  03/19/01 TD:  03/21/01 Job: [id number]

## 2010-06-27 NOTE — Assessment & Plan Note (Signed)
[facility]ALTHCARE                            CARDIOLOGY OFFICE NOTE   BEE, HAMMERSCHMIDT                        MRN:          [id number]  DATE:01/18/2006                            DOB:          [birth year]    [name] is in for followup visit. In general, he is feeling really  quite well. He has been playing golf on a regular basis. He appears to  be in remission. He denies any ongoing chest pain or significant  shortness of breath.   His medications include Toprol XL 25 mg daily, Plavix 75 mg daily,  multivitamin one daily, folic acid 400 mg daily, isosorbide 30 mg daily,  Gleevec 400 mg daily, Nexium 40 mg daily, Crestor 10 mg one-half tablet  daily, fenofibrate 200 mg daily.   PHYSICAL EXAMINATION:  Alert, oriented gentleman in no distress. Blood  pressure is 130/77, pulse is 59.  Lung fields are clear.  Cardiac rhythm is regular. No significant murmur.   EKG reveals sinus bradycardia otherwise unremarkable.   Recent lipid studies reveal a cholesterol of 103, triglycerides of 90,  HDL of 30, LDL of 54. BUN 11, creatinine 1.5. Glucose 114.   IMPRESSION:  1. Coronary artery disease, is well-documented on last cardiac      catheterization of 2003.  2. Hypercholesterolemia, mixed, on lipid-lowering therapy.  3. Mild glucose intolerance.  4. Chronic myelocytic leukemia, in remission.   PLAN:  1. Continue current medical regimen.  2. Return to clinic in 6 months.     Arturo Morton. Riley Kill, MD, [facility]  Electronically Signed    TDS/MedQ  DD: 01/18/2006  DT: 01/18/2006  Job #: [id number]   cc:   Corwin Levins, MD

## 2010-06-27 NOTE — Cardiovascular Report (Signed)
[place]  Patient:    [name], [name] Visit Number: [id number] MRN: [id number]          Service Type: EMS Location: [facility] [place] Attending Physician:  Lorre Nick Dictated by:   Jonelle Sidle, M.D. [facility] Proc. Date: 03/17/01 Admit Date:  03/16/2001 Discharge Date: 03/16/2001   CC:         Stefani Dama, M.D.  Arturo Morton. Riley Kill, M.D. [facility]   Cardiac Catheterization  DATE OF BIRTH: 1940-03-30  PRIMARY CARE PHYSICIAN: Stefani Dama, M.D.  [facility] [place] CARDIOLOGIST: Arturo Morton. Riley Kill, M.D.  INDICATIONS: The patient is a 70 year old male with a history of dyslipidemia and nonobstructive coronary artery disease by cardiac catheterization in 1998. Most recently, in September of 1992, he underwent a myocardial perfusion study which was negative for ischemia with an ejection fraction of 63%. He presents with intermittent chest discomfort over the last month, and is referred for cardiac catheterization to define the coronary anatomy.  PROCEDURES PERFORMED: 1. Left heart catheterization. 2. Selective coronary angiography. 3. Left ventriculography.  ACCESS AND EQUIPMENT: The area about the right femoral artery was anesthetized with 1% lidocaine and a 6 French sheath was placed in the right femoral artery via the modified Seldinger technique. Standard preformed 6 [place] and JR4 catheters were used for selective coronary angiography. A 6 French angled pigtail catheter was used for left heart catheterization and left ventriculography. All exchanges were made over a wire and the patient tolerated the procedure well without obvious complications.  HEMODYNAMICS: Left ventricle 128/14 mmHg, aorta 128/69 mmHg.  ANGIOGRAPHIC FINDINGS: 1. The left main coronary artery has approximately 10-20% stenosis, but    no significant flow-limiting lesions. 2. The left anterior descending is a medium caliber vessel that provides    three diagonal branches.  There is scattered 20% stenosis in the proximal    to mid vessel with 30% more distal stenosis followed by an area of    50-60% stenosis towards the apex. Flow is TIMI-3 throughout this vessel. 3. The circumflex coronary artery essentially provides one large obtuse    marginal branch then has a 20% stenosis. 4. The right coronary artery is a dominant vessel that essentially has two    branches in the posterior descending distribution. There was a 30%    distal RCA stenosis and a focal 95% stenosis in the smaller of the two    branches in the posterior descending territory.  LEFT VENTRICULOGRAM: The left ventriculography reveals an ejection fraction estimated at 60-65% with no focal wall motion abnormalities and no mitral regurgitation.  DIAGNOSES: 1. Distal and branch vessel coronary artery disease as outlined. 2. Normal left ventricular contraction estimated at 60-65% with no    significant mitral regurgitation.  RECOMMENDATIONS: Would continue to titrate medical therapy, specifically anti-ischemic therapy. Will add Imdur 30 mg p.o. q.h.s. The small branch in the PDA territory is not particularly amenable to percutaneous coronary intervention. The distal LAD lesion could potentially become a problem. If patients symptoms are not controlled with medical therapy, would suggest a repeat myocardial perfusion study to help define any potential area of ischemia and help guide revascularization options. Dictated by:   Jonelle Sidle, M.D. [facility] Attending Physician:  Lorre Nick DD:  03/17/01 TD:  03/18/01 Job: [id number]

## 2010-07-14 ENCOUNTER — Other Ambulatory Visit: Payer: Self-pay | Admitting: *Deleted

## 2010-07-14 MED ORDER — ROSUVASTATIN CALCIUM 10 MG PO TABS: 10.0000 mg | ORAL_TABLET | ORAL | Status: DC

## 2010-07-21 ENCOUNTER — Other Ambulatory Visit: Payer: Self-pay | Admitting: *Deleted

## 2010-07-21 MED ORDER — ROSUVASTATIN CALCIUM 10 MG PO TABS: 10.0000 mg | ORAL_TABLET | ORAL | Status: DC

## 2010-08-04 ENCOUNTER — Encounter: Payer: Self-pay | Admitting: Endocrinology

## 2010-08-04 ENCOUNTER — Other Ambulatory Visit (INDEPENDENT_AMBULATORY_CARE_PROVIDER_SITE_OTHER): Payer: Medicare FFS

## 2010-08-04 ENCOUNTER — Ambulatory Visit (INDEPENDENT_AMBULATORY_CARE_PROVIDER_SITE_OTHER): Payer: Medicare Other | Admitting: Endocrinology

## 2010-08-04 DIAGNOSIS — Z125 Encounter for screening for malignant neoplasm of prostate: Secondary | ICD-10-CM

## 2010-08-04 DIAGNOSIS — E782 Mixed hyperlipidemia: Secondary | ICD-10-CM

## 2010-08-04 DIAGNOSIS — Z79899 Other long term (current) drug therapy: Secondary | ICD-10-CM

## 2010-08-04 DIAGNOSIS — H9190 Unspecified hearing loss, unspecified ear: Secondary | ICD-10-CM | POA: Insufficient documentation

## 2010-08-04 DIAGNOSIS — R7309 Other abnormal glucose: Secondary | ICD-10-CM

## 2010-08-04 DIAGNOSIS — I10 Essential (primary) hypertension: Secondary | ICD-10-CM

## 2010-08-04 DIAGNOSIS — Z87442 Personal history of urinary calculi: Secondary | ICD-10-CM

## 2010-08-04 DIAGNOSIS — Z862 Personal history of diseases of the blood and blood-forming organs and certain disorders involving the immune mechanism: Secondary | ICD-10-CM

## 2010-08-04 DIAGNOSIS — Z8639 Personal history of other endocrine, nutritional and metabolic disease: Secondary | ICD-10-CM

## 2010-08-04 DIAGNOSIS — Z0000 Encounter for general adult medical examination without abnormal findings: Secondary | ICD-10-CM

## 2010-08-04 LAB — URINALYSIS, ROUTINE W REFLEX MICROSCOPIC
Hgb urine dipstick: NEGATIVE
Nitrite: NEGATIVE
Specific Gravity, Urine: >=1.03 (ref 1.000–1.030)
Total Protein, Urine: NEGATIVE
Urine Glucose: NEGATIVE
pH: 6 (ref 5.0–8.0)

## 2010-08-04 LAB — HEPATIC FUNCTION PANEL
ALT: 49 U/L (ref 0–53)
AST: 29 U/L (ref 0–37)
Bilirubin, Direct: 0.2 mg/dL (ref 0.0–0.3)
Total Bilirubin: 1.1 mg/dL (ref 0.3–1.2)

## 2010-08-04 LAB — PSA: PSA: 2.31 ng/mL (ref 0.10–4.00)

## 2010-08-04 LAB — TSH: TSH: 1.36 u[IU]/mL (ref 0.35–5.50)

## 2010-08-04 LAB — HEMOGLOBIN A1C: Hgb A1c MFr Bld: 6.4 % (ref 4.6–6.5)

## 2010-08-04 LAB — LIPID PANEL: Total CHOL/HDL Ratio: 3

## 2010-08-04 MED ORDER — METFORMIN HCL ER 500 MG PO TB24: 500.0000 mg | ORAL_TABLET | Freq: Every day | ORAL | Status: DC

## 2010-08-04 NOTE — Patient Instructions (Addendum)
please consider these measures for your health:  minimize alcohol.  do not use tobacco products.  have a colonoscopy at least every 10 years from age 70.  keep firearms safely stored.  always use seat belts.  have working smoke alarms in your home.  see an eye doctor and dentist regularly.  never drive under the influence of alcohol or drugs (including prescription drugs).  those with fair skin should take precautions against the sun. please let me know what your wishes would be, if artificial life support measures should become necessary.  it is critically important to prevent falling down (keep floor areas well-lit, dry, and free of loose objects). blood tests are being ordered for you today.  please call [phone] to hear your test results.  You will be prompted to enter the 9-digit "MRN" number that appears at the top left of this page, followed by #.  Then you will hear the message.   Please let me know if you wish to see a dietician, and this is a medicare-covered benefit. (update: i left message on phone-tree:  Add metformin 500/d.  Ret 3 mos).

## 2010-08-04 NOTE — Progress Notes (Signed)
Subjective:    Patient ID: [name], male    DOB: [birth year], 70 y.o.   MRN: [id number]  HPI Subjective:   Patient here for Medicare annual wellness visit and management of other chronic and acute problems. Pt says he had pneumovax approx 2008   Risk factors: advanced age    Roster of Physicians Providing Medical Care to Patient: Cardiol: stuckey Neurol: love Oncol: sherrill Gi: kaplan Opthal: handley Rosalita Levan)  Activities of Daily Living: In your present state of health, do you have any difficulty performing the following activities?:  Preparing food and eating?: No  Bathing yourself: No  Getting dressed: No  Using the toilet:No  Moving around from place to place: No  In the past year have you fallen or had a near fall?: No    Home Safety: Has smoke detector and wears seat belts. He keeps firearms safely stored. No excess sun exposure.  Diet and Exercise  Current exercise habits: pt says good Dietary issues discussed: pt reports healthy diet   Depression Screen  Q1: Over the past two weeks, have you felt down, depressed or hopeless? no  Q2: Over the past two weeks, have you felt little interest or pleasure in doing things? no   The following portions of the patient's history were reviewed and updated as appropriate: allergies, current medications, past family history, past medical history, past social history, past surgical history and problem list.  Past Medical History  Diagnosis Date  . Coronary atherosclerosis of unspecified type of vessel, native or graft   . Mixed hyperlipidemia   . Glucose intolerance (impaired glucose tolerance)     history of  . Diverticulosis of colon (without mention of hemorrhage)   . Hx of colonic polyps   . Internal hemorrhoids without mention of complication   . External hemorrhoids without mention of complication   . Rash and other nonspecific skin eruption   . Pneumonia, organism unspecified   . Conjunctivitis unspecified   .  Nephrolithiasis   . 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 Angioplasty 2001  . Back surgery     x2  . Tonsillectomy     History   Social History  . Marital Status: Married    Spouse Name: N/A    Number of Children: N/A  . Years of Education: N/A   Occupational History  . retired    Social History Main Topics  . Smoking status: Never Smoker   . Smokeless tobacco: Not on file  . Alcohol Use: No  . Drug Use: No  . Sexually Active: Not on file   Other Topics Concern  . Not on file   Social History Narrative  . No narrative on file    Current Outpatient Prescriptions on File Prior to Visit  Medication Sig Dispense Refill  . clopidogrel (PLAVIX) 75 MG tablet Take 75 mg by mouth daily.        . diphenoxylate-atropine (LOMOTIL) 2.5-0.025 MG per tablet Take by mouth as needed.        Marland Kitchen esomeprazole (NEXIUM) 40 MG capsule Take 40 mg by mouth daily before breakfast.        . hyoscyamine (LEVSIN SL) 0.125 MG SL tablet DISSOLVE 2 TABLETS UNDER THE TONGUE EVERY 4 HOURS AS NEEDED  30 tablet  1  . imatinib (GLEEVEC) 400 MG tablet Take 400 mg by mouth daily.        Marland Kitchen  isosorbide mononitrate (IMDUR) 30 MG 24 hr tablet Take 30 mg by mouth daily.        Marland Kitchen lisinopril (PRINIVIL,ZESTRIL) 5 MG tablet Take 1 tablet (5 mg total) by mouth daily.  90 tablet  3  . loperamide (IMODIUM) 2 MG capsule Take 2 mg by mouth as needed.        Marland Kitchen LORazepam (ATIVAN) 0.5 MG tablet Take 0.5 mg by mouth at bedtime as needed.        . metoprolol succinate (TOPROL-XL) 25 MG 24 hr tablet Take 25 mg by mouth daily.        . Multiple Vitamin (MULTIVITAMIN) tablet Take 1 tablet by mouth daily.        . ondansetron (ZOFRAN) 4 MG tablet Take 4 mg by mouth every 6 (six) hours as needed.        . promethazine (PHENERGAN) 25 MG tablet Take 25 mg by mouth as needed.        . pyridostigmine (MESTINON) 60 MG tablet 90 mg three times a day      .  rosuvastatin (CRESTOR) 10 MG tablet Take 1 tablet (10 mg total) by mouth as directed.  90 tablet  3    Allergies  Allergen Reactions  . Oxycodone-Acetaminophen     REACTION: unspecified  . Sulfamethoxazole     REACTION: unspecified    Family History  Problem Relation Age of Onset  . Heart disease Mother   . Heart disease Father   . Cancer Sister     Breast Cancer-Twin     BP 122/72  Pulse 67  Temp(Src) 98.1 F (36.7 C) (Oral)  Ht 5\' 8"  (1.727 m)  Wt 182 lb 6.4 oz (82.736 kg)  BMI 27.73 kg/m2  SpO2 98%    Review of Systems  Denies visual loss  He uses hearing aids Objective:   Vision:  Sees opthalmologist Hearing: grossly decreased Body mass index: see vs page Msk: pt easily and quickly performs "get-up-and-go" from a sitting position Cognitive Impairment Assessment: cognition, memory and judgment appear normal.  remembers 3/3 at 5 minutes.  excellent recall.  can easily read and write a sentence.  alert and oriented x 3   Assessment:   Medicare wellness utd on preventive parameters    Plan:   During the course of the visit the patient was educated and counseled about appropriate screening and preventive services including:         Fall prevention Diabetes screening  Nutrition counseling   Vaccines / LABS Zostavax / Pnemonccoal Vaccin--can't give on advice of oncol. PSA  Patient Instructions (the written plan) was given to the patient.   Review of Systems     Objective:   Physical Exam        Assessment & Plan:     SEPARATE EVALUATION FOLLOWS--EACH PROBLEM HERE IS NEW, NOT RESPONDING TO TREATMENT, OR POSES SIGNIFICANT RISK TO THE PATIENT'S HEALTH: HISTORY OF THE PRESENT ILLNESS: Denies chest pain and sob.  No weight change. PAST MEDICAL HISTORY reviewed and up to date today REVIEW OF SYSTEMS: Denies weight change PHYSICAL EXAMINATION: LUNGS:  Clear to auscultation Neck - No masses or thyromegaly or limitation in range of motion LAB/XRAY  RESULTS: Lab Results  Component Value Date   HGBA1C 6.4 08/04/2010   IMPRESSION: Dm, new PLAN: See instruction page

## 2010-08-05 ENCOUNTER — Other Ambulatory Visit: Payer: Self-pay | Admitting: *Deleted

## 2010-08-05 LAB — PTH, INTACT AND CALCIUM: PTH: 33.1 pg/mL (ref 14.0–72.0)

## 2010-08-05 MED ORDER — METFORMIN HCL ER 500 MG PO TB24: 500.0000 mg | ORAL_TABLET | Freq: Every day | ORAL | Status: DC

## 2010-08-05 NOTE — Telephone Encounter (Signed)
Pt is requesting temporary supply of Metformin be sent to his local pharmacy until he receives his prescription from his mail order service.

## 2010-08-19 ENCOUNTER — Other Ambulatory Visit: Payer: Self-pay

## 2010-08-19 MED ORDER — METFORMIN HCL ER 500 MG PO TB24: 500.0000 mg | ORAL_TABLET | Freq: Every day | ORAL | Status: DC

## 2010-08-21 ENCOUNTER — Telehealth: Payer: Self-pay

## 2010-08-21 NOTE — Telephone Encounter (Signed)
Pt came into clinic stating he wanted to start checking his blood sugars since he was started on Metformin. Per Dr Jonny Ruiz sample OneTouch Ultra 2 okay for varied CBGs qd only. Pt advised of same when sample was given.

## 2010-08-26 ENCOUNTER — Encounter: Payer: Self-pay | Admitting: *Deleted

## 2010-08-26 ENCOUNTER — Emergency Department (HOSPITAL_COMMUNITY): Payer: Medicare Other

## 2010-08-26 ENCOUNTER — Telehealth: Payer: Self-pay | Admitting: Cardiology

## 2010-08-26 ENCOUNTER — Emergency Department (HOSPITAL_COMMUNITY)
Admission: EM | Admit: 2010-08-26 | Discharge: 2010-08-26 | Disposition: A | Discharge status: Home / Self Care | Payer: Medicare Other | Attending: Emergency Medicine | Admitting: Emergency Medicine

## 2010-08-26 DIAGNOSIS — Z7902 Long term (current) use of antithrombotics/antiplatelets: Secondary | ICD-10-CM | POA: Insufficient documentation

## 2010-08-26 DIAGNOSIS — I10 Essential (primary) hypertension: Secondary | ICD-10-CM | POA: Insufficient documentation

## 2010-08-26 DIAGNOSIS — R079 Chest pain, unspecified: Secondary | ICD-10-CM | POA: Insufficient documentation

## 2010-08-26 DIAGNOSIS — C9210 Chronic myeloid leukemia, BCR/ABL-positive, not having achieved remission: Secondary | ICD-10-CM | POA: Insufficient documentation

## 2010-08-26 DIAGNOSIS — I2510 Atherosclerotic heart disease of native coronary artery without angina pectoris: Secondary | ICD-10-CM | POA: Insufficient documentation

## 2010-08-26 DIAGNOSIS — M1990 Unspecified osteoarthritis, unspecified site: Secondary | ICD-10-CM | POA: Insufficient documentation

## 2010-08-26 DIAGNOSIS — E119 Type 2 diabetes mellitus without complications: Secondary | ICD-10-CM | POA: Insufficient documentation

## 2010-08-26 DIAGNOSIS — R1013 Epigastric pain: Secondary | ICD-10-CM | POA: Insufficient documentation

## 2010-08-26 DIAGNOSIS — Z79899 Other long term (current) drug therapy: Secondary | ICD-10-CM | POA: Insufficient documentation

## 2010-08-26 DIAGNOSIS — E785 Hyperlipidemia, unspecified: Secondary | ICD-10-CM | POA: Insufficient documentation

## 2010-08-26 LAB — DIFFERENTIAL
Basophils Absolute: 0 10*3/uL (ref 0.0–0.1)
Basophils Relative: 0 % (ref 0–1)
Eosinophils Relative: 1 % (ref 0–5)
Monocytes Absolute: 1.1 10*3/uL — ABNORMAL HIGH (ref 0.1–1.0)
Monocytes Relative: 12 % (ref 3–12)
Neutro Abs: 6.1 10*3/uL (ref 1.7–7.7)

## 2010-08-26 LAB — CK TOTAL AND CKMB (NOT AT ARMC)
CK, MB: 3.9 ng/mL (ref 0.3–4.0)
Relative Index: 3 — ABNORMAL HIGH (ref 0.0–2.5)
Total CK: 131 U/L (ref 7–232)

## 2010-08-26 LAB — BASIC METABOLIC PANEL
BUN: 15 mg/dL (ref 6–23)
Chloride: 102 mEq/L (ref 96–112)
Creatinine, Ser: 1.28 mg/dL (ref 0.50–1.35)
GFR calc Af Amer: >60 mL/min (ref >60)
Glucose, Bld: 98 mg/dL (ref 70–99)
Potassium: 3.6 mEq/L (ref 3.5–5.1)

## 2010-08-26 LAB — CBC
Hemoglobin: 13.5 g/dL (ref 13.0–17.0)
MCH: 31.5 pg (ref 26.0–34.0)
MCHC: 34.2 g/dL (ref 30.0–36.0)
RDW: 15.2 % (ref 11.5–15.5)

## 2010-08-26 LAB — TROPONIN I
Troponin I: <0.3 ng/mL (ref <0.30)
Troponin I: <0.3 ng/mL (ref <0.30)

## 2010-08-26 NOTE — Telephone Encounter (Signed)
Per pt - has been having chest pain constantly since 1 am.  Pain is a 6 on a scale of one to 10. In the center of his chest and once into his back. No nausea/vomiting no SOB, no diaphoresis.  Has taken Nexium X 2 without relief.  He has SL Ntg but has not used it.  Advised pt to use ntg with instructions given.  Pt placed SL Ntg at 8:23 am.  I will call back at 8:28 to assess his pain.

## 2010-08-26 NOTE — Telephone Encounter (Signed)
Pt has not had any relief from the 1 st  SL Ntg.  He will place the second on and go to the ED.  He had been advised to call 911 however he will have his wife drive him.  [place] to be notified. Trish paged.

## 2010-08-26 NOTE — Telephone Encounter (Signed)
Per pt wife call, pt has been having chest pains since 1 am this morning. Pt wife phone call was transferred to [facility].

## 2010-09-03 ENCOUNTER — Other Ambulatory Visit: Payer: Self-pay | Admitting: Cardiology

## 2010-09-03 NOTE — Telephone Encounter (Signed)
Per pt call, pt needs RX refill of isosorbide mononitrate 30 mg Call into [facility] [phone] supply with 3 refills

## 2010-09-03 NOTE — Telephone Encounter (Signed)
[place] pt. 

## 2010-09-05 MED ORDER — ISOSORBIDE MONONITRATE ER 30 MG PO TB24: 30.0000 mg | ORAL_TABLET | Freq: Every day | ORAL | Status: DC

## 2010-09-08 MED ORDER — ISOSORBIDE MONONITRATE ER 30 MG PO TB24: 30.0000 mg | ORAL_TABLET | Freq: Every day | ORAL | Status: DC

## 2010-10-30 ENCOUNTER — Encounter: Payer: Self-pay | Admitting: Cardiology

## 2010-10-30 ENCOUNTER — Ambulatory Visit (INDEPENDENT_AMBULATORY_CARE_PROVIDER_SITE_OTHER): Payer: Medicare Other | Admitting: Cardiology

## 2010-10-30 DIAGNOSIS — I10 Essential (primary) hypertension: Secondary | ICD-10-CM

## 2010-10-30 DIAGNOSIS — E782 Mixed hyperlipidemia: Secondary | ICD-10-CM

## 2010-10-30 DIAGNOSIS — I2510 Atherosclerotic heart disease of native coronary artery without angina pectoris: Secondary | ICD-10-CM

## 2010-10-30 NOTE — Assessment & Plan Note (Signed)
Appears well controlled at the present time.

## 2010-10-30 NOTE — Assessment & Plan Note (Signed)
Last LDL was 90.  Previously has been lower. Will continue on current meds.

## 2010-10-30 NOTE — Progress Notes (Signed)
HPI:  Patient went to ER in July with chest pain.  None since.  Overall doing pretty well.  Finding out about his situation with regard to his leukemia.  Othewise stable.  No ECG found from ER visit.  Says ER MD personally reviewed but cannot find report.  He is doing ok at present.  His overall energy is low.    Current Outpatient Prescriptions  Medication Sig Dispense Refill  . calcium carbonate (OS-CAL) 600 MG TABS Take 600 mg by mouth 2 (two) times daily with a meal.        . clopidogrel (PLAVIX) 75 MG tablet Take 75 mg by mouth daily.        Marland Kitchen esomeprazole (NEXIUM) 40 MG capsule Take 40 mg by mouth daily before breakfast.        . imatinib (GLEEVEC) 400 MG tablet Take 400 mg by mouth daily.        . isosorbide mononitrate (IMDUR) 30 MG 24 hr tablet Take 1 tablet (30 mg total) by mouth daily.  90 tablet  2  . lisinopril (PRINIVIL,ZESTRIL) 5 MG tablet Take 1 tablet (5 mg total) by mouth daily.  90 tablet  3  . LORazepam (ATIVAN) 0.5 MG tablet Take 0.5 mg by mouth at bedtime as needed.        . Multiple Vitamin (MULTIVITAMIN) tablet Take 1 tablet by mouth daily.        . ondansetron (ZOFRAN) 4 MG tablet Take 4 mg by mouth every 6 (six) hours as needed.        . predniSONE (DELTASONE) 10 MG tablet 2 tablets every other day alternating with 3 tablets every other day       . promethazine (PHENERGAN) 25 MG tablet Take 25 mg by mouth as needed.        . rosuvastatin (CRESTOR) 10 MG tablet Take 1 tablet (10 mg total) by mouth as directed.  90 tablet  3  . zolpidem (AMBIEN) 10 MG tablet Take 10 mg by mouth daily.        . metoprolol succinate (TOPROL-XL) 25 MG 24 hr tablet Take 25 mg by mouth daily.          Allergies  Allergen Reactions  . Oxycodone-Acetaminophen     REACTION: unspecified  . Sulfamethoxazole     REACTION: unspecified    Past Medical History  Diagnosis Date  . Coronary atherosclerosis of unspecified type of vessel, native or graft   . Mixed hyperlipidemia   . Glucose  intolerance (impaired glucose tolerance)     history of  . Diverticulosis of colon (without mention of hemorrhage)   . Hx of colonic polyps   . Internal hemorrhoids without mention of complication   . External hemorrhoids without mention of complication   . Rash and other nonspecific skin eruption   . Pneumonia, organism unspecified   . Conjunctivitis unspecified   . Nephrolithiasis   . 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 Angioplasty 2001  . Back surgery     x2  . Tonsillectomy     Family History  Problem Relation Age of Onset  . Heart disease Mother   . Heart disease Father   . Cancer Sister     Breast Cancer-Twin     History   Social History  . Marital Status: Married    Spouse Name: N/A    Number of Children: N/A  .  Years of Education: N/A   Occupational History  . retired    Social History Main Topics  . Smoking status: Never Smoker   . Smokeless tobacco: Not on file  . Alcohol Use: No  . Drug Use: No  . Sexually Active: Not on file   Other Topics Concern  . Not on file   Social History Narrative  . No narrative on file    ROS: Please see the HPI.  All other systems reviewed and negative.  PHYSICAL EXAM:  BP 122/62  Pulse 62  Ht 5' 7.5" (1.715 m)  Wt 184 lb 6.4 oz (83.643 kg)  BMI 28.45 kg/m2  General: Well developed, well nourished, in no acute distress. Head:  Normocephalic and atraumatic. Neck: no JVD Lungs: Clear to auscultation and percussion. Heart: Normal S1 and S2.  No murmur, rubs or gallops.  Abdomen:  Normal bowel sounds; soft; non tender; no organomegaly Pulses: Pulses normal in all 4 extremities. Extremities: No clubbing or cyanosis. No edema. Neurologic: Alert and oriented x 3.  EKG:    ASSESSMENT AND PLAN:

## 2010-10-30 NOTE — Assessment & Plan Note (Signed)
Had one episode of chest pain.  None since.  Will continue on what he is on.  Follow up six months.  See overview.

## 2010-10-30 NOTE — Patient Instructions (Signed)
Your physician wants you to follow-up in: 6 MONTHS.  You will receive a reminder letter in the mail two months in advance. If you don't receive a letter, please call our office to schedule the follow-up appointment.  Your physician recommends that you continue on your current medications as directed. Please refer to the Current Medication list given to you today.  

## 2010-11-27 ENCOUNTER — Encounter (HOSPITAL_BASED_OUTPATIENT_CLINIC_OR_DEPARTMENT_OTHER): Payer: Medicare Other | Admitting: Oncology

## 2010-11-27 ENCOUNTER — Other Ambulatory Visit: Payer: Self-pay | Admitting: Oncology

## 2010-11-27 DIAGNOSIS — Z23 Encounter for immunization: Secondary | ICD-10-CM

## 2010-11-27 DIAGNOSIS — G7000 Myasthenia gravis without (acute) exacerbation: Secondary | ICD-10-CM

## 2010-11-27 DIAGNOSIS — C9211 Chronic myeloid leukemia, BCR/ABL-positive, in remission: Secondary | ICD-10-CM

## 2010-11-27 LAB — CBC WITH DIFFERENTIAL/PLATELET
Basophils Absolute: 0 10*3/uL (ref 0.0–0.1)
EOS%: 6.4 % (ref 0.0–7.0)
Eosinophils Absolute: 0.4 10*3/uL (ref 0.0–0.5)
HCT: 39.3 % (ref 38.4–49.9)
HGB: 13.4 g/dL (ref 13.0–17.1)
LYMPH%: 20.7 % (ref 14.0–49.0)
MCH: 32 pg (ref 27.2–33.4)
MCV: 93.5 fL (ref 79.3–98.0)
MONO%: 11.6 % (ref 0.0–14.0)
NEUT%: 60.8 % (ref 39.0–75.0)
Platelets: 206 10*3/uL (ref 140–400)
RDW: 13.5 % (ref 11.0–14.6)

## 2010-11-27 LAB — COMPREHENSIVE METABOLIC PANEL
AST: 26 U/L (ref 0–37)
Alkaline Phosphatase: 54 U/L (ref 39–117)
BUN: 11 mg/dL (ref 6–23)
Creatinine, Ser: 1.32 mg/dL (ref 0.50–1.35)
Glucose, Bld: 89 mg/dL (ref 70–99)
Total Bilirubin: 0.5 mg/dL (ref 0.3–1.2)

## 2010-12-18 ENCOUNTER — Other Ambulatory Visit (INDEPENDENT_AMBULATORY_CARE_PROVIDER_SITE_OTHER): Payer: Medicare Other

## 2010-12-18 ENCOUNTER — Encounter: Payer: Self-pay | Admitting: Endocrinology

## 2010-12-18 ENCOUNTER — Ambulatory Visit (INDEPENDENT_AMBULATORY_CARE_PROVIDER_SITE_OTHER): Payer: Medicare Other | Admitting: Endocrinology

## 2010-12-18 VITALS — BP 140/62 | HR 57 | Temp 97.6°F | Ht 68.0 in | Wt 184.0 lb

## 2010-12-18 DIAGNOSIS — R739 Hyperglycemia, unspecified: Secondary | ICD-10-CM

## 2010-12-18 DIAGNOSIS — E119 Type 2 diabetes mellitus without complications: Secondary | ICD-10-CM

## 2010-12-18 DIAGNOSIS — R7309 Other abnormal glucose: Secondary | ICD-10-CM

## 2010-12-18 MED ORDER — GLUCOSE BLOOD VI STRP: 1.0000 | ORAL_STRIP | Freq: Every day | Status: DC

## 2010-12-18 NOTE — Patient Instructions (Addendum)
blood tests are being requested for you today.  please call [phone] to hear your test results.  You will be prompted to enter the 9-digit "MRN" number that appears at the top left of this page, followed by #.  Then you will hear the message.   Please come back for a "medicare wellness" appointment in 9 months. good diet and exercise habits significanly improve the control of your diabetes.  please let me know if you wish to be referred to a dietician.  high blood sugar is very risky to your health.  you should see an eye doctor every year. controlling your blood pressure and cholesterol drastically reduces the damage diabetes does to your body.  this also applies to quitting smoking.  please discuss these with your doctor.  you should take an aspirin every day, unless you have been advised by a doctor not to. check your blood sugar 1 time a day.  vary the time of day when you check, between before the 3 meals, and at bedtime.  also check if you have symptoms of your blood sugar being too high or too low.  please keep a record of the readings and bring it to your next appointment here.  please call us sooner if your blood sugar goes below 70, or if it stays over 200. (update: i left message on phone-tree:  rx as we discussed)

## 2010-12-18 NOTE — Progress Notes (Signed)
Subjective:    Patient ID: [name], male    DOB: [birth year], 70 y.o.   MRN: [id number]  HPI Pt returns for f/u of hyperglycemia.  prednisone has been reduced, and it is anticipated that he will be off altogether in 2 mos.  pt states he feels well in general. Past Medical History  Diagnosis Date  . Coronary atherosclerosis of unspecified type of vessel, native or graft   . Mixed hyperlipidemia   . Glucose intolerance (impaired glucose tolerance)     history of  . Diverticulosis of colon (without mention of hemorrhage)   . Hx of colonic polyps   . Internal hemorrhoids without mention of complication   . External hemorrhoids without mention of complication   . Rash and other nonspecific skin eruption   . Pneumonia, organism unspecified   . Conjunctivitis unspecified   . Nephrolithiasis   . 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 Angioplasty 2001  . Back surgery     x2  . Tonsillectomy     History   Social History  . Marital Status: Married    Spouse Name: N/A    Number of Children: N/A  . Years of Education: N/A   Occupational History  . retired    Social History Main Topics  . Smoking status: Never Smoker   . Smokeless tobacco: Not on file  . Alcohol Use: No  . Drug Use: No  . Sexually Active: Not on file   Other Topics Concern  . Not on file   Social History Narrative  . No narrative on file    Current Outpatient Prescriptions on File Prior to Visit  Medication Sig Dispense Refill  . calcium carbonate (OS-CAL) 600 MG TABS Take 600 mg by mouth 2 (two) times daily with a meal.        . clopidogrel (PLAVIX) 75 MG tablet Take 75 mg by mouth daily.        Marland Kitchen esomeprazole (NEXIUM) 40 MG capsule Take 40 mg by mouth daily before breakfast.        . imatinib (GLEEVEC) 400 MG tablet Take 400 mg by mouth daily.        . isosorbide mononitrate (IMDUR) 30 MG 24 hr tablet Take 1 tablet (30  mg total) by mouth daily.  90 tablet  2  . lisinopril (PRINIVIL,ZESTRIL) 5 MG tablet Take 1 tablet (5 mg total) by mouth daily.  90 tablet  3  . LORazepam (ATIVAN) 0.5 MG tablet Take 0.5 mg by mouth at bedtime as needed.        . metoprolol succinate (TOPROL-XL) 25 MG 24 hr tablet Take 25 mg by mouth daily.        . Multiple Vitamin (MULTIVITAMIN) tablet Take 1 tablet by mouth daily.        . ondansetron (ZOFRAN) 4 MG tablet Take 4 mg by mouth every 6 (six) hours as needed.        . predniSONE (DELTASONE) 10 MG tablet 1/2 tablet by mouth every other day      . promethazine (PHENERGAN) 25 MG tablet Take 25 mg by mouth as needed.        . rosuvastatin (CRESTOR) 10 MG tablet Take 1 tablet (10 mg total) by mouth as directed.  90 tablet  3  . zolpidem (AMBIEN) 10 MG tablet Take 10 mg by mouth at bedtime as needed.  Allergies  Allergen Reactions  . Oxycodone-Acetaminophen     REACTION: unspecified  . Sulfamethoxazole     REACTION: unspecified    Family History  Problem Relation Age of Onset  . Heart disease Mother   . Heart disease Father   . Cancer Sister     Breast Cancer-Twin     BP 140/62  Pulse 57  Temp(Src) 97.6 F (36.4 C) (Oral)  Ht 5\' 8"  (1.727 m)  Wt 184 lb (83.462 kg)  BMI 27.98 kg/m2  SpO2 97%    Review of Systems Denies weight change    Objective:   Physical Exam Pulses: dorsalis pedis intact bilat.   Feet: no deformity.  no ulcer on the feet.  feet are of normal color and temp.  Trace bilat leg edema.  There is bilateral onychomycosis Neuro: sensation is intact to touch on the feet.     Lab Results  Component Value Date   HGBA1C 5.8 12/18/2010      Assessment & Plan:  DM, well-controlled

## 2010-12-23 ENCOUNTER — Telehealth: Payer: Self-pay

## 2010-12-23 NOTE — Telephone Encounter (Signed)
Pt called stating that his insurance company would not cover his test strips it cost $100+. Pt says that he was told by MD that id Rx was too expensive or not covered that he should not get it but call the office.

## 2010-12-23 NOTE — Telephone Encounter (Signed)
Pt informed of MD's advisement. 

## 2010-12-23 NOTE — Telephone Encounter (Signed)
Please call one of the mail-order places you see advertised on tv, and they'll send [place] a form

## 2010-12-27 ENCOUNTER — Other Ambulatory Visit: Payer: Self-pay | Admitting: Endocrinology

## 2010-12-29 ENCOUNTER — Telehealth: Payer: Self-pay | Admitting: Cardiology

## 2010-12-29 ENCOUNTER — Other Ambulatory Visit: Payer: Self-pay | Admitting: Cardiology

## 2010-12-29 MED ORDER — METOPROLOL SUCCINATE ER 25 MG PO TB24: 25.0000 mg | ORAL_TABLET | Freq: Every day | ORAL | Status: DC

## 2010-12-29 NOTE — Telephone Encounter (Signed)
Rx for Metoprolol Succinate sent to [facility].  Pt's wife aware.

## 2010-12-29 NOTE — Telephone Encounter (Signed)
Pt needs refill 25mg   Metoprolol he has been triyng to get it done for 2wks  yet this filled please call into [facility]

## 2011-01-06 ENCOUNTER — Encounter: Payer: Self-pay | Admitting: Endocrinology

## 2011-01-06 ENCOUNTER — Ambulatory Visit (INDEPENDENT_AMBULATORY_CARE_PROVIDER_SITE_OTHER): Payer: Medicare Other | Admitting: Endocrinology

## 2011-01-06 ENCOUNTER — Other Ambulatory Visit (INDEPENDENT_AMBULATORY_CARE_PROVIDER_SITE_OTHER): Payer: Medicare Other

## 2011-01-06 VITALS — BP 132/82 | HR 60 | Temp 98.6°F | Ht 68.0 in | Wt 183.0 lb

## 2011-01-06 DIAGNOSIS — R109 Unspecified abdominal pain: Secondary | ICD-10-CM

## 2011-01-06 DIAGNOSIS — R079 Chest pain, unspecified: Secondary | ICD-10-CM

## 2011-01-06 LAB — CBC WITH DIFFERENTIAL/PLATELET
Basophils Relative: 0.4 % (ref 0.0–3.0)
Eosinophils Absolute: 0.3 10*3/uL (ref 0.0–0.7)
Eosinophils Relative: 4.6 % (ref 0.0–5.0)
HCT: 37.1 % — ABNORMAL LOW (ref 39.0–52.0)
Hemoglobin: 12.7 g/dL — ABNORMAL LOW (ref 13.0–17.0)
Lymphs Abs: 1.6 10*3/uL (ref 0.7–4.0)
MCHC: 34.2 g/dL (ref 30.0–36.0)
MCV: 93.8 fl (ref 78.0–100.0)
Monocytes Absolute: 0.7 10*3/uL (ref 0.1–1.0)
Neutro Abs: 4.9 10*3/uL (ref 1.4–7.7)
Neutrophils Relative %: 64.6 % (ref 43.0–77.0)
RBC: 3.95 Mil/uL — ABNORMAL LOW (ref 4.22–5.81)
WBC: 7.7 10*3/uL (ref 4.5–10.5)

## 2011-01-06 LAB — BASIC METABOLIC PANEL
BUN: 12 mg/dL (ref 6–23)
Calcium: 8.8 mg/dL (ref 8.4–10.5)
Creatinine, Ser: 1.3 mg/dL (ref 0.4–1.5)
GFR: 58.08 mL/min — ABNORMAL LOW (ref >60.00)
Glucose, Bld: 100 mg/dL — ABNORMAL HIGH (ref 70–99)

## 2011-01-06 LAB — HEPATIC FUNCTION PANEL
ALT: 24 U/L (ref 0–53)
Bilirubin, Direct: 0.2 mg/dL (ref 0.0–0.3)
Total Bilirubin: 1 mg/dL (ref 0.3–1.2)

## 2011-01-06 LAB — AMYLASE: Amylase: 38 U/L (ref 27–131)

## 2011-01-06 NOTE — Patient Instructions (Addendum)
Stop metformin and lisinopril, until you feel better. Drink plenty of fluids.   I hope you feel better soon.  If you don't feel better by next monday, please call back.  Please continue the anti-nausea medication, as needed.   blood tests are being requested for you today.  please call [phone] to hear your test results.  You will be prompted to enter the 9-digit "MRN" number that appears at the top left of this page, followed by #.  Then you will hear the message.  (update: i left message on phone-tree:  rx as we discussed)

## 2011-01-06 NOTE — Progress Notes (Signed)
Subjective:    Patient ID: [name], male    DOB: 11-10-1940, 70 y.o.   MRN: [id number]  HPI Pt states 5 days of pain at the epigastric area, and assoc vomiting. He hasn't yet received cbg testing strips. Denies dizziness Past Medical History  Diagnosis Date  . Coronary atherosclerosis of unspecified type of vessel, native or graft   . Mixed hyperlipidemia   . Glucose intolerance (impaired glucose tolerance)     history of  . Diverticulosis of colon (without mention of hemorrhage)   . Hx of colonic polyps   . Internal hemorrhoids without mention of complication   . External hemorrhoids without mention of complication   . Rash and other nonspecific skin eruption   . Pneumonia, organism unspecified   . Conjunctivitis unspecified   . Nephrolithiasis   . 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 Angioplasty 2001  . Back surgery     x2  . Tonsillectomy     History   Social History  . Marital Status: Married    Spouse Name: N/A    Number of Children: N/A  . Years of Education: N/A   Occupational History  . retired    Social History Main Topics  . Smoking status: Never Smoker   . Smokeless tobacco: Not on file  . Alcohol Use: No  . Drug Use: No  . Sexually Active: Not on file   Other Topics Concern  . Not on file   Social History Narrative  . No narrative on file    Current Outpatient Prescriptions on File Prior to Visit  Medication Sig Dispense Refill  . calcium carbonate (OS-CAL) 600 MG TABS Take 600 mg by mouth 2 (two) times daily with a meal.        . clopidogrel (PLAVIX) 75 MG tablet Take 75 mg by mouth daily.        Marland Kitchen esomeprazole (NEXIUM) 40 MG capsule Take 40 mg by mouth daily before breakfast.        . glucose blood (ONE TOUCH ULTRA TEST) test strip 1 each by Other route daily. Use as instructed  100 each  3  . imatinib (GLEEVEC) 400 MG tablet Take 400 mg by mouth daily.         . isosorbide mononitrate (IMDUR) 30 MG 24 hr tablet Take 1 tablet (30 mg total) by mouth daily.  90 tablet  2  . lisinopril (PRINIVIL,ZESTRIL) 5 MG tablet Take 1 tablet (5 mg total) by mouth daily.  90 tablet  3  . LORazepam (ATIVAN) 0.5 MG tablet Take 0.5 mg by mouth at bedtime as needed.        . metFORMIN (GLUCOPHAGE-XR) 500 MG 24 hr tablet TAKE 1 TABLET BY MOUTH EVERY DAY WITH BREAKFAST  90 tablet  2  . metoprolol succinate (TOPROL-XL) 25 MG 24 hr tablet TAKE 1 TABLET DAILY  90 tablet  3  . Multiple Vitamin (MULTIVITAMIN) tablet Take 1 tablet by mouth daily.        . ondansetron (ZOFRAN) 4 MG tablet Take 4 mg by mouth every 6 (six) hours as needed.        . predniSONE (DELTASONE) 10 MG tablet 1/2 tablet by mouth every other day      . promethazine (PHENERGAN) 25 MG tablet Take 25 mg by mouth as needed.        . rosuvastatin (CRESTOR) 10 MG tablet Take  1 tablet (10 mg total) by mouth as directed.  90 tablet  3  . zolpidem (AMBIEN) 10 MG tablet Take 10 mg by mouth at bedtime as needed.         Allergies  Allergen Reactions  . Oxycodone-Acetaminophen     REACTION: unspecified  . Sulfamethoxazole     REACTION: unspecified    Family History  Problem Relation Age of Onset  . Heart disease Mother   . Heart disease Father   . Cancer Sister     Breast Cancer-Twin     BP 132/82  Pulse 60  Temp(Src) 98.6 F (37 C) (Oral)  Ht 5\' 8"  (1.727 m)  Wt 183 lb (83.008 kg)  BMI 27.82 kg/m2  SpO2 95%     Review of Systems He has diarrhea, but no fever or brbpr    Objective:   Physical Exam VITAL SIGNS:  See vs page GENERAL: no distress ABDOMEN: abdomen is soft, nontender.  no hepatosplenomegaly.   not distended.  no hernia.   Lab Results  Component Value Date   WBC 7.7 01/06/2011   HGB 12.7* 01/06/2011   HCT 37.1* 01/06/2011   PLT 203.0 01/06/2011   GLUCOSE 100* 01/06/2011   CHOL 167 08/04/2010   TRIG 143.0 08/04/2010   HDL 48.80 08/04/2010   LDLCALC 90 08/04/2010   ALT  24 01/06/2011   AST 25 01/06/2011   NA 141 01/06/2011   K 3.9 01/06/2011   CL 106 01/06/2011   CREATININE 1.3 01/06/2011   BUN 12 01/06/2011   CO2 27 01/06/2011   TSH 1.36 08/04/2010   PSA 2.31 08/04/2010   INR 1.00 05/02/2009   HGBA1C 5.8 12/18/2010      Assessment & Plan:  Gastroenteritis, new DM.  Metformin could exac sxs Htn.  Dizziness could be exac by zestril

## 2011-01-10 HISTORY — PX: CATARACT EXTRACTION: SUR2

## 2011-02-18 DIAGNOSIS — H2510 Age-related nuclear cataract, unspecified eye: Secondary | ICD-10-CM | POA: Diagnosis not present

## 2011-03-16 ENCOUNTER — Telehealth: Payer: Self-pay | Admitting: *Deleted

## 2011-03-16 NOTE — Telephone Encounter (Signed)
left message to inform the patient of the new date and time of the 08-2011 appointment 

## 2011-03-18 ENCOUNTER — Telehealth: Payer: Self-pay | Admitting: Oncology

## 2011-03-18 NOTE — Telephone Encounter (Signed)
S/w the pt and he is aware of his July 2013 appts °

## 2011-04-04 DIAGNOSIS — J209 Acute bronchitis, unspecified: Secondary | ICD-10-CM | POA: Diagnosis not present

## 2011-04-04 DIAGNOSIS — J069 Acute upper respiratory infection, unspecified: Secondary | ICD-10-CM | POA: Diagnosis not present

## 2011-04-09 ENCOUNTER — Encounter: Payer: Self-pay | Admitting: Internal Medicine

## 2011-04-09 ENCOUNTER — Ambulatory Visit (INDEPENDENT_AMBULATORY_CARE_PROVIDER_SITE_OTHER): Payer: Medicare Other | Admitting: Internal Medicine

## 2011-04-09 ENCOUNTER — Telehealth: Payer: Self-pay

## 2011-04-09 ENCOUNTER — Ambulatory Visit (INDEPENDENT_AMBULATORY_CARE_PROVIDER_SITE_OTHER)
Admission: RE | Admit: 2011-04-09 | Discharge: 2011-04-09 | Disposition: A | Discharge status: Home / Self Care | Payer: Medicare Other | Source: Ambulatory Visit | Attending: Internal Medicine | Admitting: Internal Medicine

## 2011-04-09 VITALS — BP 132/68 | HR 82 | Temp 98.4°F | Ht 68.0 in | Wt 181.2 lb

## 2011-04-09 DIAGNOSIS — I10 Essential (primary) hypertension: Secondary | ICD-10-CM | POA: Diagnosis not present

## 2011-04-09 DIAGNOSIS — H109 Unspecified conjunctivitis: Secondary | ICD-10-CM | POA: Diagnosis not present

## 2011-04-09 DIAGNOSIS — J209 Acute bronchitis, unspecified: Secondary | ICD-10-CM | POA: Insufficient documentation

## 2011-04-09 DIAGNOSIS — R0989 Other specified symptoms and signs involving the circulatory and respiratory systems: Secondary | ICD-10-CM | POA: Diagnosis not present

## 2011-04-09 DIAGNOSIS — E119 Type 2 diabetes mellitus without complications: Secondary | ICD-10-CM

## 2011-04-09 DIAGNOSIS — R509 Fever, unspecified: Secondary | ICD-10-CM | POA: Diagnosis not present

## 2011-04-09 DIAGNOSIS — R05 Cough: Secondary | ICD-10-CM | POA: Diagnosis not present

## 2011-04-09 MED ORDER — CEFTRIAXONE SODIUM 1 G IJ SOLR
1.0000 g | Freq: Once | INTRAMUSCULAR | Status: AC
  Administered 2011-04-09: 1 g via INTRAMUSCULAR

## 2011-04-09 MED ORDER — CEFUROXIME AXETIL 250 MG PO TABS: 250.0000 mg | ORAL_TABLET | Freq: Two times a day (BID) | ORAL | Status: AC

## 2011-04-09 MED ORDER — LEVOFLOXACIN 250 MG PO TABS: 250.0000 mg | ORAL_TABLET | Freq: Every day | ORAL | Status: AC

## 2011-04-09 MED ORDER — HYDROCODONE-HOMATROPINE 5-1.5 MG/5ML PO SYRP: 5.0000 mL | ORAL_SOLUTION | Freq: Four times a day (QID) | ORAL | Status: AC | PRN

## 2011-04-09 MED ORDER — TOBRAMYCIN 0.3 % OP SOLN: 1.0000 [drp] | Freq: Four times a day (QID) | OPHTHALMIC | Status: AC

## 2011-04-09 NOTE — Telephone Encounter (Signed)
Pt informed of new ABX sent to pharmacy.

## 2011-04-09 NOTE — Patient Instructions (Addendum)
You had the antibiotic shot today (rocephin) Take all new medications as prescribed - the pill and drops antibiotics, and cough mediciine Continue all other medications as before Please go to XRAY in the Basement for the x-ray test Please call the phone number [phone] (the PhoneTree System) for results of testing in 2-3 days;  When calling, simply dial the number, and when prompted enter the MRN number above (the Medical Record Number) and the # key, then the message should start. Please keep your appointments with your specialists as you normally do - oncology

## 2011-04-09 NOTE — Telephone Encounter (Signed)
Pt called stating that ABX he was Rx's at today visit states that individuals with Myasthenia Gravis should not take this medicine. Pt is requesting MD's advisement regarding this antibiotic.

## 2011-04-09 NOTE — Telephone Encounter (Signed)
Unlikely you have to worry, but i sent rx for a different antibiotic

## 2011-04-12 ENCOUNTER — Encounter: Payer: Self-pay | Admitting: Internal Medicine

## 2011-04-12 NOTE — Assessment & Plan Note (Signed)
stable overall by hx and exam, most recent data reviewed with pt, and pt to continue medical treatment as before  BP Readings from Last 3 Encounters:  04/09/11 132/68  01/06/11 132/82  12/18/10 140/62

## 2011-04-12 NOTE — Assessment & Plan Note (Signed)
stable overall by hx and exam, most recent data reviewed with pt, and pt to continue medical treatment as before  Lab Results  Component Value Date   HGBA1C 5.8 12/18/2010

## 2011-04-12 NOTE — Assessment & Plan Note (Signed)
Mild to mod, for antibx course,  to f/u any worsening symptoms or concerns 

## 2011-04-12 NOTE — Progress Notes (Signed)
Subjective:    Patient ID: [name], male    DOB: [birth year], 71 y.o.   MRN: [id number]  HPI  Here to f/u;  States had recent upper tooth related infection better with cleocin, but in the past wk as new onset URI/sinus symtpoms - with fever, facial pain, pressure, general weakness and malaise, and greenish d/c. Also Here with prod cough greenish sputum, but Pt denies chest pain, increased sob or doe, wheezing, orthopnea, PND, increased LE swelling, palpitations, dizziness or syncope. Also bilat eye conjunctival erythema, itchi and discomfort., and overall marked fatigue with the whole thing.  Recent CXR at [facility] in [place] neg per pt, but cough persists, and hx of leukemia. Pt denies new neurological symptoms such as new headache, or facial or extremity weakness or numbness   Pt denies polydipsia, polyuria, or low sugar symptoms such as weakness or confusion improved with po intake.  Pt states overall good compliance with meds, trying to follow lower cholesterol, diabetic diet.    Past Medical History  Diagnosis Date  . Coronary atherosclerosis of unspecified type of vessel, native or graft   . Mixed hyperlipidemia   . Glucose intolerance (impaired glucose tolerance)     history of  . Diverticulosis of colon (without mention of hemorrhage)   . Hx of colonic polyps   . Internal hemorrhoids without mention of complication   . External hemorrhoids without mention of complication   . Rash and other nonspecific skin eruption   . Pneumonia, organism unspecified   . Conjunctivitis unspecified   . Nephrolithiasis   . 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 Angioplasty 2001  . Back surgery     x2  . Tonsillectomy     reports that he has never smoked. He does not have any smokeless tobacco history on file. He reports that he does not drink alcohol or use illicit drugs. family history includes Cancer in his sister and  Heart disease in his father and mother. Allergies  Allergen Reactions  . Oxycodone-Acetaminophen     REACTION: unspecified  . Sulfamethoxazole     REACTION: unspecified   Current Outpatient Prescriptions on File Prior to Visit  Medication Sig Dispense Refill  . calcium carbonate (OS-CAL) 600 MG TABS Take 600 mg by mouth 2 (two) times daily with a meal.        . clopidogrel (PLAVIX) 75 MG tablet Take 75 mg by mouth daily.        Marland Kitchen esomeprazole (NEXIUM) 40 MG capsule Take 40 mg by mouth daily before breakfast.        . glucose blood (ONE TOUCH ULTRA TEST) test strip 1 each by Other route daily. Use as instructed  100 each  3  . imatinib (GLEEVEC) 400 MG tablet Take 400 mg by mouth daily.        . isosorbide mononitrate (IMDUR) 30 MG 24 hr tablet Take 1 tablet (30 mg total) by mouth daily.  90 tablet  2  . lisinopril (PRINIVIL,ZESTRIL) 5 MG tablet Take 1 tablet (5 mg total) by mouth daily.  90 tablet  3  . LORazepam (ATIVAN) 0.5 MG tablet Take 0.5 mg by mouth at bedtime as needed.        . metFORMIN (GLUCOPHAGE-XR) 500 MG 24 hr tablet TAKE 1 TABLET BY MOUTH EVERY DAY WITH BREAKFAST  90 tablet  2  . metoprolol succinate (TOPROL-XL) 25 MG 24 hr tablet TAKE  1 TABLET DAILY  90 tablet  3  . Multiple Vitamin (MULTIVITAMIN) tablet Take 1 tablet by mouth daily.        . ondansetron (ZOFRAN) 4 MG tablet Take 4 mg by mouth every 6 (six) hours as needed.        . predniSONE (DELTASONE) 10 MG tablet 1/2 tablet by mouth every other day      . promethazine (PHENERGAN) 25 MG tablet Take 25 mg by mouth as needed.        . rosuvastatin (CRESTOR) 10 MG tablet Take 1 tablet (10 mg total) by mouth as directed.  90 tablet  3  . zolpidem (AMBIEN) 10 MG tablet Take 10 mg by mouth at bedtime as needed.        Review of Systems Review of Systems  Constitutional: Negative for diaphoresis and unexpected weight change.  HENT: Negative for drooling and tinnitus.   Eyes: Negative for photophobia and visual  disturbance.  Respiratory: Negative for choking and stridor.   Gastrointestinal: Negative for vomiting and blood in stool.  Genitourinary: Negative for hematuria and decreased urine volume.       Objective:   Physical Exam BP 132/68  Pulse 82  Temp(Src) 98.4 F (36.9 C) (Oral)  Ht 5\' 8"  (1.727 m)  Wt 181 lb 4 oz (82.214 kg)  BMI 27.56 kg/m2  SpO2 94% Physical Exam  VS noted, mild ill Constitutional: Pt appears well-developed and well-nourished.  HENT: Head: Normocephalic.  Right Ear: External ear normal.  Left Ear: External ear normal.  Bilat tm's mild erythema.  Sinus tender right > left.  Pharynx mild erythema Eyes: Conjunctivae and EOM are normal. Pupils are equal, round, and reactive to light.  Neck: Normal range of motion. Neck supple.  Cardiovascular: Normal rate and regular rhythm.   Pulmonary/Chest: Effort normal and breath sounds normal except for noted few LLL rales, no wheeze.  Neurological: Pt is alert. No cranial nerve deficit.  Skin: Skin is warm. No erythema.  Psychiatric: Pt behavior is normal. Thought content normal. 1+ nervous    Assessment & Plan:

## 2011-04-16 DIAGNOSIS — E119 Type 2 diabetes mellitus without complications: Secondary | ICD-10-CM | POA: Diagnosis not present

## 2011-04-16 DIAGNOSIS — G7001 Myasthenia gravis with (acute) exacerbation: Secondary | ICD-10-CM | POA: Diagnosis not present

## 2011-04-17 DIAGNOSIS — H02409 Unspecified ptosis of unspecified eyelid: Secondary | ICD-10-CM | POA: Diagnosis not present

## 2011-04-17 DIAGNOSIS — R220 Localized swelling, mass and lump, head: Secondary | ICD-10-CM | POA: Diagnosis not present

## 2011-04-17 DIAGNOSIS — Z79899 Other long term (current) drug therapy: Secondary | ICD-10-CM | POA: Diagnosis not present

## 2011-04-17 DIAGNOSIS — C9211 Chronic myeloid leukemia, BCR/ABL-positive, in remission: Secondary | ICD-10-CM | POA: Diagnosis not present

## 2011-04-17 DIAGNOSIS — K029 Dental caries, unspecified: Secondary | ICD-10-CM | POA: Diagnosis not present

## 2011-04-17 DIAGNOSIS — G7000 Myasthenia gravis without (acute) exacerbation: Secondary | ICD-10-CM | POA: Diagnosis not present

## 2011-04-21 DIAGNOSIS — H5789 Other specified disorders of eye and adnexa: Secondary | ICD-10-CM | POA: Diagnosis not present

## 2011-04-28 DIAGNOSIS — M272 Inflammatory conditions of jaws: Secondary | ICD-10-CM | POA: Diagnosis not present

## 2011-04-30 ENCOUNTER — Encounter: Payer: Self-pay | Admitting: Cardiology

## 2011-04-30 ENCOUNTER — Ambulatory Visit (INDEPENDENT_AMBULATORY_CARE_PROVIDER_SITE_OTHER): Payer: Medicare Other | Admitting: Cardiology

## 2011-04-30 VITALS — BP 126/79 | HR 60 | Ht 68.0 in | Wt 185.0 lb

## 2011-04-30 DIAGNOSIS — E7800 Pure hypercholesterolemia, unspecified: Secondary | ICD-10-CM

## 2011-04-30 DIAGNOSIS — I2510 Atherosclerotic heart disease of native coronary artery without angina pectoris: Secondary | ICD-10-CM

## 2011-04-30 MED ORDER — CLOPIDOGREL BISULFATE 75 MG PO TABS: 75.0000 mg | ORAL_TABLET | Freq: Every day | ORAL | Status: DC

## 2011-04-30 MED ORDER — LISINOPRIL 5 MG PO TABS: 5.0000 mg | ORAL_TABLET | Freq: Every day | ORAL | Status: DC

## 2011-04-30 MED ORDER — ISOSORBIDE MONONITRATE ER 30 MG PO TB24: 30.0000 mg | ORAL_TABLET | Freq: Every day | ORAL | Status: DC

## 2011-04-30 NOTE — Assessment & Plan Note (Signed)
Lipid and liver in four more months.

## 2011-04-30 NOTE — Assessment & Plan Note (Signed)
No chest pain.  ECG normal.  Continue medical therapy.  Remains on clodpidogrel.

## 2011-04-30 NOTE — Patient Instructions (Signed)
Your physician wants you to follow-up in: 6 MONTHS.  You will receive a reminder letter in the mail two months in advance. If you don't receive a letter, please call our office to schedule the follow-up appointment.  Your physician recommends that you continue on your current medications as directed. Please refer to the Current Medication list given to you today.  Your physician recommends that you return for a FASTING LIPID and LIVER Profile in July at the [facility] office--nothing to eat or drink after midnight.

## 2011-04-30 NOTE — Assessment & Plan Note (Signed)
Improved

## 2011-04-30 NOTE — Progress Notes (Signed)
HPI:  No complaints cardiac wise.  Has had a rough couple of weeks.  His myasthenia has been a little worse----Prednisone was upped.  Also, he developed a tooth infection related to a prior root canal, and had to have it drained.  Has had several rounds of antibiotics, and this includes some as well for bronchitis.  Denies any chest pain.    Current Outpatient Prescriptions  Medication Sig Dispense Refill  . ampicillin (PRINCIPEN) 500 MG capsule Pt will finish this up on FRI April 30 2011      . calcium carbonate (OS-CAL) 600 MG TABS Take 600 mg by mouth 2 (two) times daily with a meal.        . clopidogrel (PLAVIX) 75 MG tablet Take 75 mg by mouth daily.        Marland Kitchen esomeprazole (NEXIUM) 40 MG capsule Take 40 mg by mouth daily before breakfast.        . glucose blood (ONE TOUCH ULTRA TEST) test strip 1 each by Other route daily. Use as instructed  100 each  3  . imatinib (GLEEVEC) 400 MG tablet Take 400 mg by mouth daily.        . isosorbide mononitrate (IMDUR) 30 MG 24 hr tablet Take 1 tablet (30 mg total) by mouth daily.  90 tablet  2  . lisinopril (PRINIVIL,ZESTRIL) 5 MG tablet Take 1 tablet (5 mg total) by mouth daily.  90 tablet  3  . LORazepam (ATIVAN) 0.5 MG tablet Take 0.5 mg by mouth at bedtime as needed.        . metFORMIN (GLUCOPHAGE-XR) 500 MG 24 hr tablet TAKE 1 TABLET BY MOUTH EVERY DAY WITH BREAKFAST  90 tablet  2  . metoprolol succinate (TOPROL-XL) 25 MG 24 hr tablet TAKE 1 TABLET DAILY  90 tablet  3  . Multiple Vitamin (MULTIVITAMIN) tablet Take 1 tablet by mouth daily.        . ondansetron (ZOFRAN) 4 MG tablet Take 4 mg by mouth every 6 (six) hours as needed.        . predniSONE (DELTASONE) 10 MG tablet 1 tab daily      . promethazine (PHENERGAN) 25 MG tablet Take 25 mg by mouth as needed.        . rosuvastatin (CRESTOR) 10 MG tablet Take 1 tablet (10 mg total) by mouth as directed.  90 tablet  3    Allergies  Allergen Reactions  . Oxycodone-Acetaminophen     REACTION:  unspecified  . Sulfamethoxazole     REACTION: unspecified    Past Medical History  Diagnosis Date  . Coronary atherosclerosis of unspecified type of vessel, native or graft   . Mixed hyperlipidemia   . Glucose intolerance (impaired glucose tolerance)     history of  . Diverticulosis of colon (without mention of hemorrhage)   . Hx of colonic polyps   . Internal hemorrhoids without mention of complication   . External hemorrhoids without mention of complication   . Rash and other nonspecific skin eruption   . Pneumonia, organism unspecified   . Conjunctivitis unspecified   . Nephrolithiasis   . 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 Angioplasty 2001  . Back surgery     x2  . Tonsillectomy     Family History  Problem Relation Age of Onset  . Heart disease Mother   . Heart disease Father   .  Cancer Sister     Breast Cancer-Twin     History   Social History  . Marital Status: Married    Spouse Name: N/A    Number of Children: N/A  . Years of Education: N/A   Occupational History  . retired    Social History Main Topics  . Smoking status: Never Smoker   . Smokeless tobacco: Not on file  . Alcohol Use: No  . Drug Use: No  . Sexually Active: Not on file   Other Topics Concern  . Not on file   Social History Narrative  . No narrative on file    ROS: Please see the HPI.  All other systems reviewed and negative.  PHYSICAL EXAM:  BP 126/79  Pulse 60  Ht 5\' 8"  (1.727 m)  Wt 185 lb (83.915 kg)  BMI 28.13 kg/m2  General: Well developed, well nourished, in no acute distress. Head:  Normocephalic and atraumatic. Neck: no JVD.  No carotid bruits.   Lungs: Clear to auscultation and percussion. Heart: Normal S1 and S2.  No murmur, rubs or gallops.  Pulses: Pulses normal in all 4 extremities. Extremities: No clubbing or cyanosis. No edema. Neurologic: Alert and oriented x  3.  EKG:  NSR.  WNL.   ASSESSMENT AND PLAN:

## 2011-05-28 DIAGNOSIS — G7001 Myasthenia gravis with (acute) exacerbation: Secondary | ICD-10-CM | POA: Diagnosis not present

## 2011-05-28 DIAGNOSIS — R259 Unspecified abnormal involuntary movements: Secondary | ICD-10-CM | POA: Diagnosis not present

## 2011-05-28 DIAGNOSIS — E119 Type 2 diabetes mellitus without complications: Secondary | ICD-10-CM | POA: Diagnosis not present

## 2011-06-09 DIAGNOSIS — H5789 Other specified disorders of eye and adnexa: Secondary | ICD-10-CM | POA: Diagnosis not present

## 2011-06-17 DIAGNOSIS — J069 Acute upper respiratory infection, unspecified: Secondary | ICD-10-CM | POA: Diagnosis not present

## 2011-06-17 DIAGNOSIS — R0609 Other forms of dyspnea: Secondary | ICD-10-CM | POA: Diagnosis not present

## 2011-06-17 DIAGNOSIS — J209 Acute bronchitis, unspecified: Secondary | ICD-10-CM | POA: Diagnosis not present

## 2011-06-17 DIAGNOSIS — R0989 Other specified symptoms and signs involving the circulatory and respiratory systems: Secondary | ICD-10-CM | POA: Diagnosis not present

## 2011-06-19 DIAGNOSIS — H1130 Conjunctival hemorrhage, unspecified eye: Secondary | ICD-10-CM | POA: Diagnosis not present

## 2011-06-22 ENCOUNTER — Ambulatory Visit (INDEPENDENT_AMBULATORY_CARE_PROVIDER_SITE_OTHER): Payer: Medicare Other | Admitting: Endocrinology

## 2011-06-22 ENCOUNTER — Encounter: Payer: Self-pay | Admitting: Endocrinology

## 2011-06-22 ENCOUNTER — Other Ambulatory Visit (INDEPENDENT_AMBULATORY_CARE_PROVIDER_SITE_OTHER): Payer: Medicare Other

## 2011-06-22 VITALS — BP 132/80 | HR 70 | Temp 97.7°F | Ht 68.0 in | Wt 182.0 lb

## 2011-06-22 DIAGNOSIS — H1130 Conjunctival hemorrhage, unspecified eye: Secondary | ICD-10-CM

## 2011-06-22 LAB — CBC WITH DIFFERENTIAL/PLATELET
Basophils Relative: 0.1 % (ref 0.0–3.0)
Eosinophils Relative: 0.3 % (ref 0.0–5.0)
HCT: 39.5 % (ref 39.0–52.0)
Lymphs Abs: 1 10*3/uL (ref 0.7–4.0)
MCHC: 33.1 g/dL (ref 30.0–36.0)
MCV: 97.8 fl (ref 78.0–100.0)
Monocytes Absolute: 0.7 10*3/uL (ref 0.1–1.0)
Platelets: 216 10*3/uL (ref 150.0–400.0)
WBC: 9.2 10*3/uL (ref 4.5–10.5)

## 2011-06-22 LAB — PROTIME-INR: Prothrombin Time: 10.2 s (ref 10.2–12.4)

## 2011-06-22 NOTE — Progress Notes (Signed)
Subjective:    Patient ID: [name], male    DOB: 12-21-1940, 71 y.o.   MRN: [id number]  HPI Pt states 4 days of moderate bleeding in the front of the left eye, but no assoc pain.  He saw opthal this am, who recommended eval for hemotasis.  He stopped plavix 2 days ago.  He does not take asa.   Past Medical History  Diagnosis Date  . Coronary atherosclerosis of unspecified type of vessel, native or graft   . Mixed hyperlipidemia   . Glucose intolerance (impaired glucose tolerance)     history of  . Diverticulosis of colon (without mention of hemorrhage)   . Hx of colonic polyps   . Internal hemorrhoids without mention of complication   . External hemorrhoids without mention of complication   . Rash and other nonspecific skin eruption   . Pneumonia, organism unspecified   . Conjunctivitis unspecified   . Nephrolithiasis   . 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 Angioplasty 2001  . Back surgery     x2  . Tonsillectomy     History   Social History  . Marital Status: Married    Spouse Name: N/A    Number of Children: N/A  . Years of Education: N/A   Occupational History  . retired    Social History Main Topics  . Smoking status: Never Smoker   . Smokeless tobacco: Not on file  . Alcohol Use: No  . Drug Use: No  . Sexually Active: Not on file   Other Topics Concern  . Not on file   Social History Narrative  . No narrative on file    Current Outpatient Prescriptions on File Prior to Visit  Medication Sig Dispense Refill  . calcium carbonate (OS-CAL) 600 MG TABS Take 600 mg by mouth 2 (two) times daily with a meal.        . clopidogrel (PLAVIX) 75 MG tablet Take 1 tablet (75 mg total) by mouth daily.  90 tablet  3  . esomeprazole (NEXIUM) 40 MG capsule Take 40 mg by mouth daily before breakfast.        . glucose blood (ONE TOUCH ULTRA TEST) test strip 1 each by Other route daily.  Use as instructed  100 each  3  . imatinib (GLEEVEC) 400 MG tablet Take 400 mg by mouth daily.        . isosorbide mononitrate (IMDUR) 30 MG 24 hr tablet Take 1 tablet (30 mg total) by mouth daily.  90 tablet  3  . lisinopril (PRINIVIL,ZESTRIL) 5 MG tablet Take 1 tablet (5 mg total) by mouth daily.  90 tablet  3  . LORazepam (ATIVAN) 0.5 MG tablet Take 0.5 mg by mouth at bedtime as needed.        . metFORMIN (GLUCOPHAGE-XR) 500 MG 24 hr tablet TAKE 1 TABLET BY MOUTH EVERY DAY WITH BREAKFAST  90 tablet  2  . metoprolol succinate (TOPROL-XL) 25 MG 24 hr tablet TAKE 1 TABLET DAILY  90 tablet  3  . Multiple Vitamin (MULTIVITAMIN) tablet Take 1 tablet by mouth daily.        . ondansetron (ZOFRAN) 4 MG tablet Take 4 mg by mouth every 6 (six) hours as needed.        . promethazine (PHENERGAN) 25 MG tablet Take 25 mg by mouth as needed.        Marland Kitchen  rosuvastatin (CRESTOR) 10 MG tablet Take 1 tablet (10 mg total) by mouth as directed.  90 tablet  3  . DISCONTD: zolpidem (AMBIEN) 10 MG tablet Take 10 mg by mouth at bedtime as needed.         Allergies  Allergen Reactions  . Oxycodone-Acetaminophen     REACTION: unspecified  . Sulfamethoxazole     REACTION: unspecified    Family History  Problem Relation Age of Onset  . Heart disease Mother   . Heart disease Father   . Cancer Sister     Breast Cancer-Twin     BP 132/80  Pulse 70  Temp(Src) 97.7 F (36.5 C) (Oral)  Ht 5\' 8"  (1.727 m)  Wt 182 lb (82.555 kg)  BMI 27.67 kg/m2  SpO2 96%    Review of Systems Denies visual loss.  Denies hematuria and brbpr    Objective:   Physical Exam VITAL SIGNS:  See vs page GENERAL: no distress head: no deformity eyes: no periorbital swelling, no proptosis.  There is a moderate left subconjunctival hemorrhage.   external nose and ears are normal. mouth: no lesion seen.   Lab Results  Component Value Date   WBC 9.2 06/22/2011   HGB 13.1 06/22/2011   HCT 39.5 06/22/2011   MCV 97.8 06/22/2011   PLT  216.0 06/22/2011      Assessment & Plan:  Subconjunctival hemorrhage, new.

## 2011-06-22 NOTE — Patient Instructions (Addendum)
blood tests are being requested for you today.  You will receive a letter with results.   If this test is ok, you can resume the plavix.

## 2011-07-11 DIAGNOSIS — I82409 Acute embolism and thrombosis of unspecified deep veins of unspecified lower extremity: Secondary | ICD-10-CM

## 2011-07-11 HISTORY — DX: Acute embolism and thrombosis of unspecified deep veins of unspecified lower extremity: I82.409

## 2011-07-11 DEATH — deceased

## 2011-07-20 ENCOUNTER — Other Ambulatory Visit: Payer: Self-pay | Admitting: *Deleted

## 2011-07-20 MED ORDER — ROSUVASTATIN CALCIUM 10 MG PO TABS: 10.0000 mg | ORAL_TABLET | ORAL | Status: DC

## 2011-07-24 DIAGNOSIS — C9211 Chronic myeloid leukemia, BCR/ABL-positive, in remission: Secondary | ICD-10-CM | POA: Diagnosis not present

## 2011-07-30 ENCOUNTER — Encounter (HOSPITAL_COMMUNITY): Payer: Self-pay

## 2011-07-30 ENCOUNTER — Emergency Department (HOSPITAL_COMMUNITY): Payer: Medicare Other

## 2011-07-30 ENCOUNTER — Telehealth: Payer: Self-pay | Admitting: Gastroenterology

## 2011-07-30 ENCOUNTER — Inpatient Hospital Stay (HOSPITAL_COMMUNITY)
Admission: EM | Admit: 2011-07-30 | Discharge: 2011-08-01 | DRG: 176 | Disposition: A | Discharge status: Home / Self Care | Payer: Medicare Other | Source: Ambulatory Visit | Attending: Internal Medicine | Admitting: Internal Medicine

## 2011-07-30 DIAGNOSIS — R1033 Periumbilical pain: Secondary | ICD-10-CM

## 2011-07-30 DIAGNOSIS — C9211 Chronic myeloid leukemia, BCR/ABL-positive, in remission: Secondary | ICD-10-CM | POA: Diagnosis present

## 2011-07-30 DIAGNOSIS — K219 Gastro-esophageal reflux disease without esophagitis: Secondary | ICD-10-CM | POA: Diagnosis present

## 2011-07-30 DIAGNOSIS — B370 Candidal stomatitis: Secondary | ICD-10-CM

## 2011-07-30 DIAGNOSIS — R21 Rash and other nonspecific skin eruption: Secondary | ICD-10-CM

## 2011-07-30 DIAGNOSIS — R109 Unspecified abdominal pain: Secondary | ICD-10-CM

## 2011-07-30 DIAGNOSIS — K5730 Diverticulosis of large intestine without perforation or abscess without bleeding: Secondary | ICD-10-CM

## 2011-07-30 DIAGNOSIS — G7000 Myasthenia gravis without (acute) exacerbation: Secondary | ICD-10-CM | POA: Diagnosis present

## 2011-07-30 DIAGNOSIS — C9212 Chronic myeloid leukemia, BCR/ABL-positive, in relapse: Secondary | ICD-10-CM | POA: Diagnosis not present

## 2011-07-30 DIAGNOSIS — R197 Diarrhea, unspecified: Secondary | ICD-10-CM

## 2011-07-30 DIAGNOSIS — I82409 Acute embolism and thrombosis of unspecified deep veins of unspecified lower extremity: Secondary | ICD-10-CM

## 2011-07-30 DIAGNOSIS — K6289 Other specified diseases of anus and rectum: Secondary | ICD-10-CM

## 2011-07-30 DIAGNOSIS — Z7902 Long term (current) use of antithrombotics/antiplatelets: Secondary | ICD-10-CM

## 2011-07-30 DIAGNOSIS — H109 Unspecified conjunctivitis: Secondary | ICD-10-CM

## 2011-07-30 DIAGNOSIS — E119 Type 2 diabetes mellitus without complications: Secondary | ICD-10-CM | POA: Diagnosis present

## 2011-07-30 DIAGNOSIS — Z79899 Other long term (current) drug therapy: Secondary | ICD-10-CM | POA: Diagnosis not present

## 2011-07-30 DIAGNOSIS — C9210 Chronic myeloid leukemia, BCR/ABL-positive, not having achieved remission: Secondary | ICD-10-CM

## 2011-07-30 DIAGNOSIS — I10 Essential (primary) hypertension: Secondary | ICD-10-CM | POA: Diagnosis not present

## 2011-07-30 DIAGNOSIS — E782 Mixed hyperlipidemia: Secondary | ICD-10-CM | POA: Diagnosis present

## 2011-07-30 DIAGNOSIS — IMO0002 Reserved for concepts with insufficient information to code with codable children: Secondary | ICD-10-CM

## 2011-07-30 DIAGNOSIS — R0602 Shortness of breath: Secondary | ICD-10-CM | POA: Diagnosis not present

## 2011-07-30 DIAGNOSIS — Z86711 Personal history of pulmonary embolism: Secondary | ICD-10-CM | POA: Diagnosis present

## 2011-07-30 DIAGNOSIS — I824Z9 Acute embolism and thrombosis of unspecified deep veins of unspecified distal lower extremity: Secondary | ICD-10-CM | POA: Diagnosis not present

## 2011-07-30 DIAGNOSIS — J209 Acute bronchitis, unspecified: Secondary | ICD-10-CM

## 2011-07-30 DIAGNOSIS — Z111 Encounter for screening for respiratory tuberculosis: Secondary | ICD-10-CM

## 2011-07-30 DIAGNOSIS — Z86010 Personal history of colonic polyps: Secondary | ICD-10-CM

## 2011-07-30 DIAGNOSIS — J189 Pneumonia, unspecified organism: Secondary | ICD-10-CM

## 2011-07-30 DIAGNOSIS — E785 Hyperlipidemia, unspecified: Secondary | ICD-10-CM | POA: Diagnosis not present

## 2011-07-30 DIAGNOSIS — I2699 Other pulmonary embolism without acute cor pulmonale: Principal | ICD-10-CM | POA: Diagnosis present

## 2011-07-30 DIAGNOSIS — I2510 Atherosclerotic heart disease of native coronary artery without angina pectoris: Secondary | ICD-10-CM | POA: Diagnosis not present

## 2011-07-30 DIAGNOSIS — K648 Other hemorrhoids: Secondary | ICD-10-CM

## 2011-07-30 DIAGNOSIS — R079 Chest pain, unspecified: Secondary | ICD-10-CM | POA: Diagnosis not present

## 2011-07-30 DIAGNOSIS — R6881 Early satiety: Secondary | ICD-10-CM

## 2011-07-30 DIAGNOSIS — Z86718 Personal history of other venous thrombosis and embolism: Secondary | ICD-10-CM | POA: Diagnosis present

## 2011-07-30 DIAGNOSIS — K644 Residual hemorrhoidal skin tags: Secondary | ICD-10-CM

## 2011-07-30 DIAGNOSIS — Z125 Encounter for screening for malignant neoplasm of prostate: Secondary | ICD-10-CM

## 2011-07-30 DIAGNOSIS — M79609 Pain in unspecified limb: Secondary | ICD-10-CM

## 2011-07-30 DIAGNOSIS — F411 Generalized anxiety disorder: Secondary | ICD-10-CM | POA: Diagnosis present

## 2011-07-30 DIAGNOSIS — H1130 Conjunctival hemorrhage, unspecified eye: Secondary | ICD-10-CM

## 2011-07-30 DIAGNOSIS — R1013 Epigastric pain: Secondary | ICD-10-CM

## 2011-07-30 DIAGNOSIS — R739 Hyperglycemia, unspecified: Secondary | ICD-10-CM

## 2011-07-30 DIAGNOSIS — Z87442 Personal history of urinary calculi: Secondary | ICD-10-CM

## 2011-07-30 DIAGNOSIS — H02409 Unspecified ptosis of unspecified eyelid: Secondary | ICD-10-CM

## 2011-07-30 LAB — COMPREHENSIVE METABOLIC PANEL
Alkaline Phosphatase: 54 U/L (ref 39–117)
BUN: 13 mg/dL (ref 6–23)
Chloride: 103 mEq/L (ref 96–112)
GFR calc Af Amer: 55 mL/min — ABNORMAL LOW (ref >90)
Glucose, Bld: 164 mg/dL — ABNORMAL HIGH (ref 70–99)
Potassium: 4.1 mEq/L (ref 3.5–5.1)
Total Bilirubin: 0.4 mg/dL (ref 0.3–1.2)

## 2011-07-30 LAB — PROTIME-INR: INR: 0.92 (ref 0.00–1.49)

## 2011-07-30 LAB — DIFFERENTIAL
Basophils Absolute: 0 10*3/uL (ref 0.0–0.1)
Lymphocytes Relative: 6 % — ABNORMAL LOW (ref 12–46)
Neutro Abs: 10.4 10*3/uL — ABNORMAL HIGH (ref 1.7–7.7)

## 2011-07-30 LAB — GLUCOSE, CAPILLARY: Glucose-Capillary: 168 mg/dL — ABNORMAL HIGH (ref 70–99)

## 2011-07-30 LAB — CBC
Platelets: 202 10*3/uL (ref 150–400)
RDW: 13.6 % (ref 11.5–15.5)
WBC: 11.6 10*3/uL — ABNORMAL HIGH (ref 4.0–10.5)

## 2011-07-30 MED ORDER — ACETAMINOPHEN 325 MG PO TABS: 650.0000 mg | ORAL_TABLET | Freq: Four times a day (QID) | ORAL | Status: DC | PRN

## 2011-07-30 MED ORDER — HEPARIN (PORCINE) IN NACL 100-0.45 UNIT/ML-% IJ SOLN
1250.0000 [IU]/h | INTRAMUSCULAR | Status: DC
  Administered 2011-07-31: 1250 [IU]/h via INTRAVENOUS
  Filled 2011-07-30 (×3): qty 250

## 2011-07-30 MED ORDER — INSULIN ASPART 100 UNIT/ML ~~LOC~~ SOLN
0.0000 [IU] | Freq: Three times a day (TID) | SUBCUTANEOUS | Status: DC
  Administered 2011-07-31: 1 [IU] via SUBCUTANEOUS
  Administered 2011-07-31: 2 [IU] via SUBCUTANEOUS

## 2011-07-30 MED ORDER — ALUM & MAG HYDROXIDE-SIMETH 200-200-20 MG/5ML PO SUSP: 30.0000 mL | Freq: Four times a day (QID) | ORAL | Status: DC | PRN

## 2011-07-30 MED ORDER — ONDANSETRON 4 MG PO TBDP
4.0000 mg | ORAL_TABLET | Freq: Once | ORAL | Status: AC
  Administered 2011-07-30: 4 mg via ORAL
  Filled 2011-07-30: qty 1

## 2011-07-30 MED ORDER — ONDANSETRON 4 MG PO TBDP: 4.0000 mg | ORAL_TABLET | Freq: Once | ORAL | Status: DC

## 2011-07-30 MED ORDER — IOHEXOL 350 MG/ML SOLN
100.0000 mL | Freq: Once | INTRAVENOUS | Status: AC | PRN
  Administered 2011-07-30: 100 mL via INTRAVENOUS

## 2011-07-30 MED ORDER — ONDANSETRON HCL 4 MG PO TABS: 4.0000 mg | ORAL_TABLET | Freq: Four times a day (QID) | ORAL | Status: DC | PRN

## 2011-07-30 MED ORDER — ACETAMINOPHEN 650 MG RE SUPP: 650.0000 mg | Freq: Four times a day (QID) | RECTAL | Status: DC | PRN

## 2011-07-30 MED ORDER — INSULIN ASPART 100 UNIT/ML ~~LOC~~ SOLN: 0.0000 [IU] | Freq: Every day | SUBCUTANEOUS | Status: DC

## 2011-07-30 MED ORDER — HYDROCODONE-ACETAMINOPHEN 5-325 MG PO TABS
1.0000 | ORAL_TABLET | Freq: Once | ORAL | Status: AC
  Administered 2011-07-30: 1 via ORAL
  Filled 2011-07-30: qty 1

## 2011-07-30 MED ORDER — ZOLPIDEM TARTRATE 5 MG PO TABS
5.0000 mg | ORAL_TABLET | Freq: Every evening | ORAL | Status: DC | PRN
  Administered 2011-07-31 (×2): 5 mg via ORAL
  Filled 2011-07-30 (×2): qty 1

## 2011-07-30 MED ORDER — ONDANSETRON HCL 4 MG/2ML IJ SOLN: 4.0000 mg | Freq: Four times a day (QID) | INTRAMUSCULAR | Status: DC | PRN

## 2011-07-30 MED ORDER — SODIUM CHLORIDE 0.9 % IV SOLN
INTRAVENOUS | Status: DC
  Administered 2011-07-30: 22:00:00 via INTRAVENOUS

## 2011-07-30 MED ORDER — ENOXAPARIN SODIUM 80 MG/0.8ML ~~LOC~~ SOLN
1.0000 mg/kg | SUBCUTANEOUS | Status: AC
  Administered 2011-07-30: 80 mg via SUBCUTANEOUS
  Filled 2011-07-30: qty 0.8

## 2011-07-30 MED ORDER — ENOXAPARIN (LOVENOX) PATIENT EDUCATION KIT
PACK | Status: DC
  Filled 2011-07-30 (×2): qty 1

## 2011-07-30 MED ORDER — HYDROMORPHONE HCL PF 1 MG/ML IJ SOLN
0.5000 mg | INTRAMUSCULAR | Status: DC | PRN
  Administered 2011-07-31 (×3): 1 mg via INTRAVENOUS
  Filled 2011-07-30 (×3): qty 1

## 2011-07-30 NOTE — H&P (Signed)
DATE OF ADMISSION:  07/30/2011  PCP:   Romero Belling, MD   Chief Complaint: SOB and Left Calf Pain   HPI: [name] is an 71 y.o. 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  He denies having chest pain.  He denies having any trauma to his left leg that he can remember.  He continues to be very active, and denies being sedentary or ill lately causing recummbency.  He was evaluated in the ED and was found to have a DVT in the LLE, and a CTA of the Chest was performed which also found an acute pulmonary Embolism.  He was started on an IV Heparin drip and referred for medical admission.  His oncologist is DR. Truett Perna and he also goes to [facility] oncology as well.      Past Medical History  Diagnosis Date  . Coronary atherosclerosis of unspecified type of vessel, native or graft   . Mixed hyperlipidemia   . Glucose intolerance (impaired glucose tolerance)     history of  . Diverticulosis of colon (without mention of hemorrhage)   . Hx of colonic polyps   . Internal hemorrhoids without mention of complication   . External hemorrhoids without mention of complication   . Rash and other nonspecific skin eruption   . Pneumonia, organism unspecified   . Conjunctivitis unspecified   . Nephrolithiasis   . 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 Angioplasty 2001  . Back surgery     x2  . Tonsillectomy     Medications:  HOME MEDS: Prior to Admission medications   Medication Sig Start Date End Date Taking? Authorizing Provider  clopidogrel (PLAVIX) 75 MG tablet Take 1 tablet (75 mg total) by mouth daily. 04/30/11  Yes Herby Abraham, MD  esomeprazole (NEXIUM) 40 MG capsule Take 40 mg by mouth daily before breakfast.     Yes Historical  Provider, MD  imatinib (GLEEVEC) 400 MG tablet Take 400 mg by mouth daily.     Yes Historical Provider, MD  isosorbide mononitrate (IMDUR) 30 MG 24 hr tablet Take 1 tablet (30 mg total) by mouth daily. 04/30/11  Yes Herby Abraham, MD  lisinopril (PRINIVIL,ZESTRIL) 5 MG tablet Take 1 tablet (5 mg total) by mouth daily. 04/30/11  Yes Herby Abraham, MD  LORazepam (ATIVAN) 0.5 MG tablet Take 0.5 mg by mouth at bedtime as needed. For sleep.   Yes Historical Provider, MD  metFORMIN (GLUCOPHAGE-XR) 500 MG 24 hr tablet Take 500 mg by mouth daily with breakfast.   Yes Historical Provider, MD  metoprolol succinate (TOPROL-XL) 25 MG 24 hr tablet Take 25 mg by mouth daily.   Yes Historical Provider, MD  Multiple Vitamin (MULTIVITAMIN) tablet Take 1 tablet by mouth daily.     Yes Historical Provider, MD  ondansetron (ZOFRAN) 4 MG tablet Take 4 mg by mouth every 6 (six) hours as needed. For nausea.   Yes Historical Provider, MD  predniSONE (DELTASONE) 20 MG tablet Take 20 mg by mouth daily.   Yes Historical Provider, MD  promethazine (PHENERGAN) 25 MG tablet Take 25 mg by mouth daily as needed. For nausea.   Yes Historical Provider, MD  rosuvastatin (CRESTOR) 10 MG tablet Take 10 mg by  mouth daily.   Yes Historical Provider, MD    Allergies:  Allergies  Allergen Reactions  . Oxycodone-Acetaminophen Other (See Comments)    Went "out of head"  . Sulfamethoxazole Rash    Social History:   reports that he has never smoked. He does not have any smokeless tobacco history on file. He reports that he does not drink alcohol or use illicit drugs.  Family History: Family History  Problem Relation Age of Onset  . Heart disease Mother   . Heart disease Father   . Cancer Sister     Breast Cancer-Twin     Review of Systems:  The patient denies anorexia, fever, weight loss, vision loss, decreased hearing, hoarseness, chest pain, syncope,  peripheral edema, balance deficits, hemoptysis, abdominal pain,  melena, hematochezia, severe indigestion/heartburn, hematuria, incontinence, genital sores, suspicious skin lesions, transient blindness, difficulty walking, depression, unusual weight change, abnormal bleeding, enlarged lymph nodes, angioedema, and breast masses.   Physical Exam:  GEN: Pleasant 71 year old well nourished and well developed Caucasian male examined  and in no acute distress; cooperative with exam Filed Vitals:   07/30/11 1902 07/30/11 1911 07/30/11 1930 07/30/11 2115  BP: 135/74 131/63 123/67 135/63  Pulse: 71 66 68 79  Temp: 98.2 F (36.8 C)     TempSrc: Oral     Resp: 18   18  Height:      Weight:      SpO2: 98% 97% 98% 97%   Blood pressure 135/63, pulse 79, temperature 98.2 F (36.8 C), temperature source Oral, resp. rate 18, height 5\' 8"  (1.727 m), weight 80.287 kg (177 lb), SpO2 97.00%. PSYCH: He is alert and oriented x4; does not appear anxious does not appear depressed; affect is normal HEENT: Normocephalic and Atraumatic, Mucous membranes pink; PERRLA; EOM intact; Fundi:  Benign;  No scleral icterus, Nares: Patent, Oropharynx: Clear, Fair Dentition, Neck:  FROM, no cervical lymphadenopathy nor thyromegaly or carotid bruit; no JVD; Breasts:: Not examined CHEST WALL: No tenderness CHEST: Normal respiration, clear to auscultation bilaterally HEART: Regular rate and rhythm; no murmurs rubs or gallops BACK: No kyphosis or scoliosis; no CVA tenderness ABDOMEN: Positive Bowel Sounds, Obese, soft non-tender; no masses, no organomegaly.   Rectal Exam: Not done EXTREMITIES: No bone or joint deformity; age-appropriate arthropathy of the hands and knees; no cyanosis, clubbing or edema; no ulcerations.  Genitalia: not examinedPULSES: 2+ and symmetric SKIN: Normal hydration no rash or ulceration CNS: Cranial nerves 2-12 grossly intact no focal neurologic deficit   Labs & Imaging Results for orders placed during the hospital encounter of 07/30/11 (from the past 48 hour(s))   COMPREHENSIVE METABOLIC PANEL     Status: Abnormal   Collection Time   07/30/11  3:06 PM      Component Value Range Comment   Sodium 140  135 - 145 mEq/L    Potassium 4.1  3.5 - 5.1 mEq/L    Chloride 103  96 - 112 mEq/L    CO2 26  19 - 32 mEq/L    Glucose, Bld 164 (*) 70 - 99 mg/dL    BUN 13  6 - 23 mg/dL    Creatinine, Ser 1.91 (*) 0.50 - 1.35 mg/dL    Calcium 9.4  8.4 - 47.8 mg/dL    Total Protein 6.4  6.0 - 8.3 g/dL    Albumin 3.9  3.5 - 5.2 g/dL    AST 29  0 - 37 U/L    ALT 35  0 - 53  U/L    Alkaline Phosphatase 54  39 - 117 U/L    Total Bilirubin 0.4  0.3 - 1.2 mg/dL    GFR calc non Af Amer 47 (*) >90 mL/min    GFR calc Af Amer 55 (*) >90 mL/min   CBC     Status: Abnormal   Collection Time   07/30/11  3:06 PM      Component Value Range Comment   WBC 11.6 (*) 4.0 - 10.5 K/uL    RBC 4.38  4.22 - 5.81 MIL/uL    Hemoglobin 14.1  13.0 - 17.0 g/dL    HCT 16.1  09.6 - 04.5 %    MCV 93.8  78.0 - 100.0 fL    MCH 32.2  26.0 - 34.0 pg    MCHC 34.3  30.0 - 36.0 g/dL    RDW 40.9  81.1 - 91.4 %    Platelets 202  150 - 400 K/uL   DIFFERENTIAL     Status: Abnormal   Collection Time   07/30/11  3:06 PM      Component Value Range Comment   Neutrophils Relative 89 (*) 43 - 77 %    Neutro Abs 10.4 (*) 1.7 - 7.7 K/uL    Lymphocytes Relative 6 (*) 12 - 46 %    Lymphs Abs 0.7  0.7 - 4.0 K/uL    Monocytes Relative 5  3 - 12 %    Monocytes Absolute 0.5  0.1 - 1.0 K/uL    Eosinophils Relative 0  0 - 5 %    Eosinophils Absolute 0.0  0.0 - 0.7 K/uL    Basophils Relative 0  0 - 1 %    Basophils Absolute 0.0  0.0 - 0.1 K/uL   PROTIME-INR     Status: Normal   Collection Time   07/30/11  4:34 PM      Component Value Range Comment   Prothrombin Time 12.6  11.6 - 15.2 seconds    INR 0.92  0.00 - 1.49    Ct Angio Chest W/cm &/or Wo Cm  07/30/2011  *RADIOLOGY REPORT*  Clinical Data: Shortness breath.  Chest pain.  Left leg DVT. Chronic myelogenous leukemia.  CT ANGIOGRAPHY CHEST  Technique:   Multidetector CT imaging of the chest using the standard protocol during bolus administration of intravenous contrast. Multiplanar reconstructed images including MIPs were obtained and reviewed to evaluate the vascular anatomy.  Contrast: OMNIPAQUE IOHEXOL 350 MG/ML SOLN  Comparison: 03/24/2010  Findings: Mild pulmonary embolism is seen in the distal right pulmonary artery as well as the right middle, right lower, and left lower lobar pulmonary arteries.  No evidence of thoracic aortic aneurysm or dissection.  No evidence of mediastinal mass or lymphadenopathy.  No evidence of pleural or pericardial effusion.  Mild bibasilar scarring is seen.  No evidence of pulmonary infiltrate or mass.  IMPRESSION:  Positive for bilateral pulmonary embolism.  No other acute findings.  Critical Value/emergent results were called by telephone at the time of interpretation on 07/30/2011  at 2030 hours  to  Dr. Ignacia Palma in the emergency department, who verbally acknowledged these results.  Original Report Authenticated By: Danae Orleans, M.D.      Assessment: Present on Admission:  .Pulmonary embolism .DVT of lower extremity (deep venous thrombosis) .LEUKEMIA, MYELOID, CHRONIC .Hyperglycemia .CORONARY ARTERY DISEASE .HYPERTENSION .HYPERLIPIDEMIA, MIXED .Diabetes mellitus   Plan:    Admit to Telemetry Bed  IV Heparin Drip per Pharmacy Consider Continued Therapeutic Options for Outpatient  treatment for next 6 months with Coumadin Versus Xarelto versus Lovenox Injections and possible IVC Filter placement.  Reconcile Home medications Consult Dr. Truett Perna his Oncologist.   SSI coverage PRN Other plans as per orders.    CODE STATUS:      FULL CODE        Lilliane Sposito C 07/30/2011, 9:35 PM

## 2011-07-30 NOTE — ED Notes (Signed)
Report given to floor nurse and pt transported to floor. Pt alert and oriented upon admission to floor

## 2011-07-30 NOTE — ED Notes (Signed)
Pt also reports nausea and vomiting this week with umbilical pain, pt reports abdominal distension.

## 2011-07-30 NOTE — ED Notes (Signed)
Pt states pain is worse when trying to walk on leg.  Pt states pain has gotten worse over the past couple of weeks.

## 2011-07-30 NOTE — Progress Notes (Signed)
VASCULAR LAB PRELIMINARY  PRELIMINARY  PRELIMINARY  PRELIMINARY  Left lower extremity venous duplex completed.    Preliminary report:  Left: DVT noted in the posterior tibial vein.  No evidence of superficial thrombosis.  No Baker's cyst.  Sheyna Pettibone, RVT 07/30/2011, 5:24 PM

## 2011-07-30 NOTE — Consult Note (Signed)
ANTICOAGULATION CONSULT NOTE - Initial Consult  Pharmacy Consult for Heparin Indication: B-pulmonary embolism  per CT Angio, + L-DVT  Allergies  Allergen Reactions  . Oxycodone-Acetaminophen Other (See Comments)    Went "out of head"  . Sulfamethoxazole Rash    Patient Measurements: Height: 5\' 8"  (172.7 cm) Weight: 177 lb (80.287 kg) IBW/kg (Calculated) : 68.4   Vital Signs: Temp: 98.2 F (36.8 C) (06/20 1902) Temp src: Oral (06/20 1902) BP: 123/67 mmHg (06/20 1930) Pulse Rate: 68  (06/20 1930)  Labs:  Basename 07/30/11 1634 07/30/11 1506  HGB -- 14.1  HCT -- 41.1  PLT -- 202  APTT -- --  LABPROT 12.6 --  INR 0.92 --  HEPARINUNFRC -- --  CREATININE -- 1.42*  CKTOTAL -- --  CKMB -- --  TROPONINI -- --   Estimated Creatinine Clearance: 44.8 ml/min (by C-G formula based on Cr of 1.42).  Medical / Surgical History: Past Medical History  Diagnosis Date  . Coronary atherosclerosis of unspecified type of vessel, native or graft   . Mixed hyperlipidemia   . Glucose intolerance (impaired glucose tolerance)     history of  . Diverticulosis of colon (without mention of hemorrhage)   . Hx of colonic polyps   . Internal hemorrhoids without mention of complication   . External hemorrhoids without mention of complication   . Rash and other nonspecific skin eruption   . Pneumonia, organism unspecified   . Conjunctivitis unspecified   . Nephrolithiasis   . 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 Angioplasty 2001  . Back surgery     x2  . Tonsillectomy     Medications:  No current facility-administered medications on file prior to encounter.   Current Outpatient Prescriptions on File Prior to Encounter  Medication Sig Dispense Refill  . clopidogrel (PLAVIX) 75 MG tablet Take 1 tablet (75 mg total) by mouth daily.  90 tablet  3  . esomeprazole (NEXIUM) 40 MG capsule Take 40 mg by mouth  daily before breakfast.        . imatinib (GLEEVEC) 400 MG tablet Take 400 mg by mouth daily.        . isosorbide mononitrate (IMDUR) 30 MG 24 hr tablet Take 1 tablet (30 mg total) by mouth daily.  90 tablet  3  . lisinopril (PRINIVIL,ZESTRIL) 5 MG tablet Take 1 tablet (5 mg total) by mouth daily.  90 tablet  3  . Multiple Vitamin (MULTIVITAMIN) tablet Take 1 tablet by mouth daily.        . ondansetron (ZOFRAN) 4 MG tablet Take 4 mg by mouth every 6 (six) hours as needed. For nausea.      . promethazine (PHENERGAN) 25 MG tablet Take 25 mg by mouth daily as needed. For nausea.      Marland Kitchen DISCONTD: metFORMIN (GLUCOPHAGE-XR) 500 MG 24 hr tablet TAKE 1 TABLET BY MOUTH EVERY DAY WITH BREAKFAST  90 tablet  2  . DISCONTD: metoprolol succinate (TOPROL-XL) 25 MG 24 hr tablet TAKE 1 TABLET DAILY  90 tablet  3  . DISCONTD: zolpidem (AMBIEN) 10 MG tablet Take 10 mg by mouth at bedtime as needed.         Scheduled:    . enoxaparin (LOVENOX) injection  1 mg/kg Subcutaneous To ER  . enoxaparin   Does not apply To ER  . HYDROcodone-acetaminophen  1 tablet Oral Once  . ondansetron  4 mg Oral  Once  . DISCONTD: ondansetron  4 mg Oral Once    Assessment:  71 y/o male to be admitted for bilateral PEs and left lower extremity DVT.    After receiving Lovenox 80mg  sq x 1,  IV heparin will be started  per pharmacy protocol..   Heparin infusion will be started 9 hours after Lovenox administered.  Goal of Therapy:   Heparin level 0.3-0.7 units/ml   Plan:   At 04:00 am, 9 hours after Lovenox dose given, Heparin to be started, No Load, at 1250 units/hr.  Heparin level and CBC will be drawn 8 hours after infusion started.  Daily Heparin level and CBC while on Heparin.   Naiah Donahoe, Elisha Headland, Pharm.D.   07/30/2011 9:24 PM

## 2011-07-30 NOTE — ED Provider Notes (Signed)
History     CSN: [id number]  Arrival date & time 07/30/11  1438   First MD Initiated Contact with Patient 07/30/11 1532      Chief Complaint  Patient presents with  . Leg Pain    (Consider location/radiation/quality/duration/timing/severity/associated sxs/prior treatment) Patient is a 71 y.o. male presenting with leg pain. The history is provided by the patient and the spouse. No language interpreter was used.  Leg Pain  There was no injury mechanism. The pain is present in the left leg. The pain is at a severity of 3/10. The pain is mild. The pain has been worsening since onset. Associated symptoms include numbness. Pertinent negatives include no inability to bear weight, no loss of motion, no muscle weakness, no loss of sensation and no tingling. The symptoms are aggravated by activity and bearing weight. Treatments tried: toradol 60mg  at urgent care made feel better.   71 year old male coming from rash for urgent care with left calf pain x 2 or 3 weeks. Patient had a shot of Toradol at the urgent care and he feels better now pain is 3/10. Patient states the pain was worse with ambulation and palpitations. Patient is here for ultrasound of lower extremity to rule out DVT. Also complaining of nausea from his daily chemotherapy treatments that he has been taking for 9 years for his leukemia. Denies shortness of breath or long trips.  No Leg swelling or pitting edema.  No pmh of blood clots.  Scores a 1 for the wells criteria.    Past Medical History  Diagnosis Date  . Coronary atherosclerosis of unspecified type of vessel, native or graft   . Mixed hyperlipidemia   . Glucose intolerance (impaired glucose tolerance)     history of  . Diverticulosis of colon (without mention of hemorrhage)   . Hx of colonic polyps   . Internal hemorrhoids without mention of complication   . External hemorrhoids without mention of complication   . Rash and other nonspecific skin eruption   . Pneumonia,  organism unspecified   . Conjunctivitis unspecified   . Nephrolithiasis   . 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 Angioplasty 2001  . Back surgery     x2  . Tonsillectomy     Family History  Problem Relation Age of Onset  . Heart disease Mother   . Heart disease Father   . Cancer Sister     Breast Cancer-Twin     History  Substance Use Topics  . Smoking status: Never Smoker   . Smokeless tobacco: Not on file  . Alcohol Use: No      Review of Systems  Constitutional: Negative.   HENT: Negative.   Eyes: Negative.   Respiratory: Negative.  Negative for shortness of breath.   Cardiovascular: Negative.  Negative for chest pain and leg swelling.  Gastrointestinal: Positive for nausea. Negative for vomiting.  Musculoskeletal: Positive for back pain and gait problem.  Neurological: Positive for numbness. Negative for dizziness, tingling, weakness and light-headedness.       R foot numb  Psychiatric/Behavioral: Negative.   All other systems reviewed and are negative.    Allergies  Oxycodone-acetaminophen and Sulfamethoxazole  Home Medications   Current Outpatient Rx  Name Route Sig Dispense Refill  . CLOPIDOGREL BISULFATE 75 MG PO TABS Oral Take 1 tablet (75 mg total) by mouth daily. 90 tablet 3  . ESOMEPRAZOLE MAGNESIUM 40 MG PO  CPDR Oral Take 40 mg by mouth daily before breakfast.      . IMATINIB MESYLATE 400 MG PO TABS Oral Take 400 mg by mouth daily.      . ISOSORBIDE MONONITRATE ER 30 MG PO TB24 Oral Take 1 tablet (30 mg total) by mouth daily. 90 tablet 3  . LISINOPRIL 5 MG PO TABS Oral Take 1 tablet (5 mg total) by mouth daily. 90 tablet 3  . LORAZEPAM 0.5 MG PO TABS Oral Take 0.5 mg by mouth at bedtime as needed. For sleep.    Marland Kitchen METFORMIN HCL ER 500 MG PO TB24 Oral Take 500 mg by mouth daily with breakfast.    . METOPROLOL SUCCINATE ER 25 MG PO TB24 Oral Take 25 mg by mouth daily.     Marland Kitchen ONE-DAILY MULTI VITAMINS PO TABS Oral Take 1 tablet by mouth daily.      Marland Kitchen ONDANSETRON HCL 4 MG PO TABS Oral Take 4 mg by mouth every 6 (six) hours as needed. For nausea.    Marland Kitchen PREDNISONE 20 MG PO TABS Oral Take 20 mg by mouth daily.    Marland Kitchen PROMETHAZINE HCL 25 MG PO TABS Oral Take 25 mg by mouth daily as needed. For nausea.    Marland Kitchen ROSUVASTATIN CALCIUM 10 MG PO TABS Oral Take 10 mg by mouth daily.      BP 153/72  Pulse 75  Temp 98.4 F (36.9 C) (Oral)  Resp 18  Ht 5\' 8"  (1.727 m)  Wt 177 lb (80.287 kg)  BMI 26.91 kg/m2  SpO2 98%  Physical Exam  Nursing note and vitals reviewed. Constitutional: He is oriented to person, place, and time. He appears well-developed and well-nourished.  HENT:  Head: Normocephalic.  Eyes: Conjunctivae and EOM are normal. Pupils are equal, round, and reactive to light.  Neck: Normal range of motion. Neck supple.  Cardiovascular: Normal rate.   Pulmonary/Chest: Effort normal and breath sounds normal. No respiratory distress. He has no rales.  Abdominal: Soft. Bowel sounds are normal. He exhibits distension. There is no tenderness. There is no rebound.  Musculoskeletal: Normal range of motion. He exhibits tenderness.       Bilateral lower back pain  L calf tenderness no swelling 1+ R pedal pulse  Neurological: He is alert and oriented to person, place, and time. No cranial nerve deficit.  Skin: Skin is warm and dry.  Psychiatric: He has a normal mood and affect.    ED Course  Procedures (including critical care time)  Labs Reviewed  CBC - Abnormal; Notable for the following:    WBC 11.6 (*)     All other components within normal limits  DIFFERENTIAL - Abnormal; Notable for the following:    Neutrophils Relative 89 (*)     Neutro Abs 10.4 (*)     Lymphocytes Relative 6 (*)     All other components within normal limits  COMPREHENSIVE METABOLIC PANEL   No results found.   No diagnosis found.    MDM  Patient will be admitted by the  hospitalist for bilateral PEs and left lower extremity DVT. IV heparin was ordered per pharmacy. Patient received a shot of Lovenox while in the ER. Patient was given Percocet for pain. Vital signs stable. O2 sat 98% on room air. Labs Reviewed  COMPREHENSIVE METABOLIC PANEL - Abnormal; Notable for the following:    Glucose, Bld 164 (*)     Creatinine, Ser 1.42 (*)     GFR calc non Af Amer 47 (*)  GFR calc Af Amer 55 (*)     All other components within normal limits  CBC - Abnormal; Notable for the following:    WBC 11.6 (*)     All other components within normal limits  DIFFERENTIAL - Abnormal; Notable for the following:    Neutrophils Relative 89 (*)     Neutro Abs 10.4 (*)     Lymphocytes Relative 6 (*)     All other components within normal limits  PROTIME-INR  BASIC METABOLIC PANEL  CBC          Remi Haggard, NP 07/30/11 2101

## 2011-07-30 NOTE — ED Notes (Signed)
Report received from prior RN

## 2011-07-30 NOTE — Telephone Encounter (Signed)
Patient's wife reports several week history of abdominal pain.  He has a history of gastroparesis and leukemia.  They are offered an appt with the extender, but declined and would prefer to see Dr. Arlyce Dice 08/05/11 9:30.  They will call back if they want to see the extender

## 2011-07-30 NOTE — ED Notes (Signed)
Pt presents with pain to L calf with swelling x 2 weeks.  Pt denies any injury.  Pt seen at Urgent Care in [place] and referred here.

## 2011-07-30 NOTE — ED Notes (Signed)
Pt given Menu and given number to call to place a food order

## 2011-07-30 NOTE — Progress Notes (Signed)
4:30 PM Medical screening examination/treatment/procedure(s) were conducted as a shared visit with non-physician practitioner(s) and myself.  I personally evaluated the patient during the encounter Pt Is a 71 year old man with a history of chronic myelogenous leukemia. For 2 weeks he has had calf. It has gotten worse today, and he went to an urgent care Center which sent him to the hospital to be checked for deep venous thrombosis. Exam shows him to be a pleasant elderly man in no distress heart and lungs were normal abdomen is soft and nontender extremities he has mild tenderness over the left calf mostly on the left left gastrocnemius muscle. Workup and venous Dopplers were ordered. Complains of nausea, and Zofran was ordered. Venous dopplers showed a DVT.  He advised [place] at that time that he was having some chest pain and shortness of breath.  CT angio of the chest showed bilateral pulmonary emboli. He was admitted, Pharmacy consult requested to dose him with unfractionated heparin.

## 2011-07-31 ENCOUNTER — Encounter (HOSPITAL_COMMUNITY): Payer: Self-pay | Admitting: *Deleted

## 2011-07-31 DIAGNOSIS — C9212 Chronic myeloid leukemia, BCR/ABL-positive, in relapse: Secondary | ICD-10-CM | POA: Diagnosis not present

## 2011-07-31 DIAGNOSIS — I2699 Other pulmonary embolism without acute cor pulmonale: Secondary | ICD-10-CM

## 2011-07-31 DIAGNOSIS — I824Z9 Acute embolism and thrombosis of unspecified deep veins of unspecified distal lower extremity: Secondary | ICD-10-CM | POA: Diagnosis not present

## 2011-07-31 DIAGNOSIS — R0602 Shortness of breath: Secondary | ICD-10-CM

## 2011-07-31 LAB — GLUCOSE, CAPILLARY
Glucose-Capillary: 95 mg/dL (ref 70–99)
Glucose-Capillary: 131 mg/dL — ABNORMAL HIGH (ref 70–99)
Glucose-Capillary: 142 mg/dL — ABNORMAL HIGH (ref 70–99)

## 2011-07-31 LAB — BASIC METABOLIC PANEL
BUN: 16 mg/dL (ref 6–23)
CO2: 26 mEq/L (ref 19–32)
Calcium: 8.4 mg/dL (ref 8.4–10.5)
Chloride: 106 mEq/L (ref 96–112)
Creatinine, Ser: 1.34 mg/dL (ref 0.50–1.35)
Glucose, Bld: 91 mg/dL (ref 70–99)

## 2011-07-31 LAB — CBC
HCT: 36.6 % — ABNORMAL LOW (ref 39.0–52.0)
MCH: 31.9 pg (ref 26.0–34.0)
MCV: 94.1 fL (ref 78.0–100.0)
RDW: 13.8 % (ref 11.5–15.5)
WBC: 9.8 10*3/uL (ref 4.0–10.5)

## 2011-07-31 MED ORDER — PANTOPRAZOLE SODIUM 40 MG PO TBEC
40.0000 mg | DELAYED_RELEASE_TABLET | Freq: Every day | ORAL | Status: DC
  Administered 2011-07-31: 40 mg via ORAL
  Filled 2011-07-31: qty 1

## 2011-07-31 MED ORDER — ENOXAPARIN SODIUM 120 MG/0.8ML ~~LOC~~ SOLN
120.0000 mg | SUBCUTANEOUS | Status: DC
  Administered 2011-07-31 – 2011-08-01 (×2): 120 mg via SUBCUTANEOUS
  Filled 2011-07-31 (×3): qty 0.8

## 2011-07-31 MED ORDER — COUMADIN BOOK
Freq: Once | Status: AC
  Administered 2011-07-31: 09:00:00
  Filled 2011-07-31: qty 1

## 2011-07-31 MED ORDER — WARFARIN SODIUM 7.5 MG PO TABS
7.5000 mg | ORAL_TABLET | Freq: Once | ORAL | Status: AC
  Administered 2011-07-31: 7.5 mg via ORAL
  Filled 2011-07-31: qty 1

## 2011-07-31 MED ORDER — ASPIRIN EC 81 MG PO TBEC
81.0000 mg | DELAYED_RELEASE_TABLET | Freq: Every day | ORAL | Status: DC
  Administered 2011-07-31 – 2011-08-01 (×2): 81 mg via ORAL
  Filled 2011-07-31 (×3): qty 1

## 2011-07-31 MED ORDER — ENOXAPARIN (LOVENOX) PATIENT EDUCATION KIT
PACK | Freq: Once | Status: AC
  Administered 2011-07-31: 13:00:00
  Filled 2011-07-31: qty 1

## 2011-07-31 MED ORDER — METOPROLOL SUCCINATE ER 25 MG PO TB24
25.0000 mg | ORAL_TABLET | Freq: Every day | ORAL | Status: DC
  Administered 2011-08-01: 25 mg via ORAL
  Filled 2011-07-31: qty 1

## 2011-07-31 MED ORDER — WARFARIN VIDEO
Freq: Once | Status: AC
  Administered 2011-07-31: 09:00:00

## 2011-07-31 MED ORDER — ISOSORBIDE MONONITRATE ER 30 MG PO TB24
30.0000 mg | ORAL_TABLET | Freq: Every day | ORAL | Status: DC
  Administered 2011-07-31 – 2011-08-01 (×2): 30 mg via ORAL
  Filled 2011-07-31 (×2): qty 1

## 2011-07-31 MED ORDER — LISINOPRIL 5 MG PO TABS
5.0000 mg | ORAL_TABLET | Freq: Every day | ORAL | Status: DC
  Administered 2011-07-31 – 2011-08-01 (×2): 5 mg via ORAL
  Filled 2011-07-31 (×2): qty 1

## 2011-07-31 MED ORDER — METOPROLOL SUCCINATE ER 25 MG PO TB24
25.0000 mg | ORAL_TABLET | Freq: Every day | ORAL | Status: DC
  Filled 2011-07-31: qty 1

## 2011-07-31 MED ORDER — WARFARIN - PHARMACIST DOSING INPATIENT: Freq: Every day | Status: DC

## 2011-07-31 MED ORDER — CLOPIDOGREL BISULFATE 75 MG PO TABS
75.0000 mg | ORAL_TABLET | Freq: Every day | ORAL | Status: DC
  Administered 2011-07-31: 75 mg via ORAL
  Filled 2011-07-31 (×2): qty 1

## 2011-07-31 MED ORDER — PATIENT'S GUIDE TO USING COUMADIN BOOK
Freq: Once | Status: AC
  Administered 2011-07-31: 09:00:00
  Filled 2011-07-31: qty 1

## 2011-07-31 MED ORDER — ATORVASTATIN CALCIUM 20 MG PO TABS
20.0000 mg | ORAL_TABLET | Freq: Every day | ORAL | Status: DC
  Administered 2011-07-31: 20 mg via ORAL
  Filled 2011-07-31 (×2): qty 1

## 2011-07-31 MED ORDER — LORAZEPAM 0.5 MG PO TABS: 0.5000 mg | ORAL_TABLET | Freq: Every evening | ORAL | Status: DC | PRN

## 2011-07-31 MED ORDER — PROMETHAZINE HCL 25 MG PO TABS: 25.0000 mg | ORAL_TABLET | Freq: Four times a day (QID) | ORAL | Status: DC | PRN

## 2011-07-31 MED ORDER — IMATINIB MESYLATE 100 MG PO TABS
400.0000 mg | ORAL_TABLET | Freq: Every day | ORAL | Status: DC
  Administered 2011-07-31 – 2011-08-01 (×2): 400 mg via ORAL
  Filled 2011-07-31 (×2): qty 4

## 2011-07-31 MED ORDER — PREDNISONE 20 MG PO TABS
20.0000 mg | ORAL_TABLET | Freq: Every day | ORAL | Status: DC
  Administered 2011-07-31 – 2011-08-01 (×2): 20 mg via ORAL
  Filled 2011-07-31 (×3): qty 1

## 2011-07-31 MED ORDER — ADULT MULTIVITAMIN W/MINERALS CH
1.0000 | ORAL_TABLET | Freq: Every day | ORAL | Status: DC
  Administered 2011-07-31 – 2011-08-01 (×2): 1 via ORAL
  Filled 2011-07-31 (×2): qty 1

## 2011-07-31 NOTE — Progress Notes (Signed)
PATIENT DETAILS Name: [name] Age: 71 y.o. Sex: male Date of Birth: September 26, 1940 Admit Date: 07/30/2011 EAV:[id number], SEAN, MD  Subjective: Still with some left calf pain, currently not short of breath  Objective: Vital signs in last 24 hours: Filed Vitals:   07/31/11 0228 07/31/11 0604 07/31/11 1000 07/31/11 1115  BP: 93/63 123/72  132/66  Pulse: 70 64 60   Temp: 97.6 F (36.4 C) 97.9 F (36.6 C)    TempSrc: Oral Oral    Resp: 20 16    Height:      Weight:      SpO2: 97% 98%      Weight change:   Body mass index is 26.78 kg/(m^2).  Intake/Output from previous day:  Intake/Output Summary (Last 24 hours) at 07/31/11 1208 Last data filed at 07/31/11 0606  Gross per 24 hour  Intake  572.5 ml  Output      0 ml  Net  572.5 ml    PHYSICAL EXAM: Gen Exam: Awake and alert with clear speech.   Neck: Supple, No JVD.   Chest: B/L Clear.   CVS: S1 S2 Regular, no murmurs.  Abdomen: soft, BS +, non tender, non distended.  Extremities: no edema, lower extremities warm to touch. Neurologic: Non Focal.   Skin: No Rash.   Wounds: N/A.    CONSULTS:  Phone Consult-Dr Sherrill  LAB RESULTS: CBC  Lab 07/31/11 0520 07/30/11 1506  WBC 9.8 11.6*  HGB 12.4* 14.1  HCT 36.6* 41.1  PLT 164 202  MCV 94.1 93.8  MCH 31.9 32.2  MCHC 33.9 34.3  RDW 13.8 13.6  LYMPHSABS -- 0.7  MONOABS -- 0.5  EOSABS -- 0.0  BASOSABS -- 0.0  BANDABS -- --    Chemistries   Lab 07/31/11 0520 07/30/11 1506  NA 143 140  K 3.8 4.1  CL 106 103  CO2 26 26  GLUCOSE 91 164*  BUN 16 13  CREATININE 1.34 1.42*  CALCIUM 8.4 9.4  MG -- --    CBG:  Lab 07/31/11 0803 07/30/11 2241  GLUCAP 95 168*    GFR Estimated Creatinine Clearance: 47.5 ml/min (by C-G formula based on Cr of 1.34).  Coagulation profile  Lab 07/30/11 1634  INR 0.92  PROTIME --    Cardiac Enzymes No results found for this basename: CK:3,CKMB:3,TROPONINI:3,MYOGLOBIN:3 in the last 168 hours  No components found  with this basename: POCBNP:3 No results found for this basename: DDIMER:2 in the last 72 hours  Basename 07/30/11 2105  HGBA1C 5.9*   No results found for this basename: CHOL:2,HDL:2,LDLCALC:2,TRIG:2,CHOLHDL:2,LDLDIRECT:2 in the last 72 hours No results found for this basename: TSH,T4TOTAL,FREET3,T3FREE,THYROIDAB in the last 72 hours No results found for this basename: VITAMINB12:2,FOLATE:2,FERRITIN:2,TIBC:2,IRON:2,RETICCTPCT:2 in the last 72 hours No results found for this basename: LIPASE:2,AMYLASE:2 in the last 72 hours  Urine Studies No results found for this basename: UACOL:2,UAPR:2,USPG:2,UPH:2,UTP:2,UGL:2,UKET:2,UBIL:2,UHGB:2,UNIT:2,UROB:2,ULEU:2,UEPI:2,UWBC:2,URBC:2,UBAC:2,CAST:2,CRYS:2,UCOM:2,BILUA:2 in the last 72 hours  MICROBIOLOGY: No results found for this or any previous visit (from the past 240 hour(s)).  RADIOLOGY STUDIES/RESULTS: Ct Angio Chest W/cm &/or Wo Cm  07/30/2011  *RADIOLOGY REPORT*  Clinical Data: Shortness breath.  Chest pain.  Left leg DVT. Chronic myelogenous leukemia.  CT ANGIOGRAPHY CHEST  Technique:  Multidetector CT imaging of the chest using the standard protocol during bolus administration of intravenous contrast. Multiplanar reconstructed images including MIPs were obtained and reviewed to evaluate the vascular anatomy.  Contrast: OMNIPAQUE IOHEXOL 350 MG/ML SOLN  Comparison: 03/24/2010  Findings: Mild pulmonary embolism is seen in  the distal right pulmonary artery as well as the right middle, right lower, and left lower lobar pulmonary arteries.  No evidence of thoracic aortic aneurysm or dissection.  No evidence of mediastinal mass or lymphadenopathy.  No evidence of pleural or pericardial effusion.  Mild bibasilar scarring is seen.  No evidence of pulmonary infiltrate or mass.  IMPRESSION:  Positive for bilateral pulmonary embolism.  No other acute findings.  Critical Value/emergent results were called by telephone at the time of interpretation on  07/30/2011  at 2030 hours  to  Dr. Ignacia Palma in the emergency department, who verbally acknowledged these results.  Original Report Authenticated By: Danae Orleans, M.D.    MEDICATIONS: Scheduled Meds:   . aspirin EC  81 mg Oral Daily  . atorvastatin  20 mg Oral q1800  . coumadin book   Does not apply Once  . enoxaparin (LOVENOX) injection  120 mg Subcutaneous Q24H  . enoxaparin (LOVENOX) injection  1 mg/kg Subcutaneous To ER  . enoxaparin   Does not apply Once  . HYDROcodone-acetaminophen  1 tablet Oral Once  . imatinib  400 mg Oral Daily  . insulin aspart  0-5 Units Subcutaneous QHS  . insulin aspart  0-9 Units Subcutaneous TID WC  . isosorbide mononitrate  30 mg Oral Daily  . lisinopril  5 mg Oral Daily  . metoprolol succinate  25 mg Oral Daily  . multivitamin with minerals  1 tablet Oral Daily  . ondansetron  4 mg Oral Once  . pantoprazole  40 mg Oral Q1200  . patient's guide to using coumadin book   Does not apply Once  . predniSONE  20 mg Oral Q breakfast  . warfarin  7.5 mg Oral ONCE-1800  . warfarin   Does not apply Once  . Warfarin - Pharmacist Dosing Inpatient   Does not apply q1800  . DISCONTD: clopidogrel  75 mg Oral Q breakfast  . DISCONTD: enoxaparin   Does not apply To ER  . DISCONTD: ondansetron  4 mg Oral Once   Continuous Infusions:   . DISCONTD: sodium chloride 75 mL/hr at 07/31/11 0606  . DISCONTD: heparin 1,250 Units/hr (07/31/11 0351)   PRN Meds:.acetaminophen, acetaminophen, alum & mag hydroxide-simeth, HYDROmorphone (DILAUDID) injection, iohexol, LORazepam, ondansetron (ZOFRAN) IV, ondansetron, promethazine, zolpidem  Antibiotics: Anti-infectives    None      Assessment/Plan: Principal Problem:  *Pulmonary embolism with DVT -spoke with Dr Gertie Gowda over phone, patient's CML in remission, so no need for long term Lovenox therapy, ok to treat with coumadin long term. Has has GI issues-diarrhea mostly in the past, therefore will plan to treat  with Coumadin and overlapping Lovenox -begin coumadin and lovenox teaching -INR check with [facility] will need to be arranged  Active Problems: CAD -no h/o PCI in the past -on Plavix, spoke with Dr Riley Kill on the phone, since patient will be on anticoagulation-he suggests switching to ASA -c/w statin and beta blockers   LEUKEMIA, MYELOID, CHRONIC -in remission -c/w Gleevac -outpatient f/u with Dr Truett Perna and also with Oncology at [facility], MIXED -c/w statin   HYPERTENSION -BP with good control -continue with Imdur, Metoprolol and Lisinopril   Diabetes mellitus -CBG's conrolled -resume Metformin on discharge -c/w SSI while inpatient  GERD -PPI  Anxiety -as needed Lorazepam  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

## 2011-07-31 NOTE — Care Management Note (Signed)
    Page 1 of 1   08/03/2011     5:29:05 PM   CARE MANAGEMENT NOTE 08/03/2011  Patient:  [name], [name]   Account Number:  [id number]  Date Initiated:  07/31/2011  Documentation initiated by:  Letha Cape  Subjective/Objective Assessment:   dx pe and dvt  admit-lives with spouse, pta independent.     Action/Plan:   Anticipated DC Date:  08/01/2011   Anticipated DC Plan:  HOME/SELF CARE      DC Planning Services  CM consult      Choice offered to / List presented to:             Status of service:  Completed, signed off Medicare Important Message given?   (If response is "NO", the following Medicare IM given date fields will be blank) Date Medicare IM given:   Date Additional Medicare IM given:    Discharge Disposition:  HOME/SELF CARE  Per UR Regulation:  Reviewed for med. necessity/level of care/duration of stay  If discussed at Long Length of Stay Meetings, dates discussed:    Comments:  07/31/11 14:33 Letha Cape RN, BSN [phone] patient lives with spouse, pta independent.  Patient has medication coverage and transportation,  patient states he gets his medications from [facility] in [place] on at [id number].  I called to see how much would 120mg  lovenox qd x 5 injections be , the pharmacist stats $15.09 and he has them in stock.  NCM will continue to follow for dc needs.

## 2011-07-31 NOTE — Progress Notes (Signed)
ANTICOAGULATION CONSULT NOTE - Follow Up Consult  Pharmacy Consult for lovenox Indication: pulmonary embolus and DVT  Allergies  Allergen Reactions  . Oxycodone-Acetaminophen Other (See Comments)    Went "out of head"  . Sulfamethoxazole Rash    Patient Measurements: Height: 5\' 8"  (172.7 cm) Weight: 176 lb 2.4 oz (79.9 kg) IBW/kg (Calculated) : 68.4   Vital Signs: Temp: 97.9 F (36.6 C) (06/21 0604) Temp src: Oral (06/21 0604) BP: 123/72 mmHg (06/21 0604) Pulse Rate: 64  (06/21 0604)  Labs:  Basename 07/31/11 0520 07/30/11 1634 07/30/11 1506  HGB 12.4* -- 14.1  HCT 36.6* -- 41.1  PLT 164 -- 202  APTT -- -- --  LABPROT -- 12.6 --  INR -- 0.92 --  HEPARINUNFRC -- -- --  CREATININE 1.34 -- 1.42*  CKTOTAL -- -- --  CKMB -- -- --  TROPONINI -- -- --    Estimated Creatinine Clearance: 47.5 ml/min (by C-G formula based on Cr of 1.34).  Assessment: 71 yom CML patient on oral chemotherapy through [facility] admitted for treatment of PE and DVT. Initially given a dose of lovenox yesterday but then changed to a heparin gtt. Now changing back to lovenox (once daily dosing requested) and also adding coumadin per oncologist recommendations.   Goal of Therapy:  INR 2-3 Anti-Xa level 0.6-1.2 units/ml 4hrs after LMWH dose given Monitor platelets by anticoagulation protocol: Yes   Plan:  1. Start lovenox 120mg  SQ Q24H, 1 hour after heparin gtt stopped 2. Coumadin 7.5mg  PO x 1 tonight 3. Daily INR 4. Coumadin education materials 5. CBC Q72H while on lovenox  [name], [name] 07/31/2011,9:34 AM

## 2011-07-31 NOTE — ED Provider Notes (Signed)
4:30 PM  Medical screening examination/treatment/procedure(s) were conducted as a shared visit with non-physician practitioner(s) and myself. I personally evaluated the patient during the encounter  Pt Is a 71 year old man with a history of chronic myelogenous leukemia. For 2 weeks he has had calf. It has gotten worse today, and he went to an urgent care Center which sent him to the hospital to be checked for deep venous thrombosis. Exam shows him to be a pleasant elderly man in no distress heart and lungs were normal abdomen is soft and nontender extremities he has mild tenderness over the left calf mostly on the left left gastrocnemius muscle. Workup and venous Dopplers were ordered. Complains of nausea, and Zofran was ordered.  Venous dopplers showed a DVT. He advised [place] at that time that he was having some chest pain and shortness of breath. CT angio of the chest showed bilateral pulmonary emboli. He was admitted, Pharmacy consult requested to dose him with unfractionated heparin.       Carleene Cooper III, MD 07/31/11 1257

## 2011-07-31 NOTE — Progress Notes (Signed)
Pt was oriented to room and put in bed comfortably. Pt is resting in bed.

## 2011-08-01 DIAGNOSIS — C9212 Chronic myeloid leukemia, BCR/ABL-positive, in relapse: Secondary | ICD-10-CM

## 2011-08-01 DIAGNOSIS — R0602 Shortness of breath: Secondary | ICD-10-CM

## 2011-08-01 DIAGNOSIS — I824Z9 Acute embolism and thrombosis of unspecified deep veins of unspecified distal lower extremity: Secondary | ICD-10-CM

## 2011-08-01 DIAGNOSIS — I2699 Other pulmonary embolism without acute cor pulmonale: Secondary | ICD-10-CM

## 2011-08-01 LAB — CBC
HCT: 36.1 % — ABNORMAL LOW (ref 39.0–52.0)
Platelets: 190 10*3/uL (ref 150–400)
RBC: 3.84 MIL/uL — ABNORMAL LOW (ref 4.22–5.81)
RDW: 13.6 % (ref 11.5–15.5)
WBC: 9.1 10*3/uL (ref 4.0–10.5)

## 2011-08-01 LAB — PROTIME-INR: INR: 1.02 (ref 0.00–1.49)

## 2011-08-01 MED ORDER — ACETAMINOPHEN 325 MG PO TABS: 650.0000 mg | ORAL_TABLET | Freq: Four times a day (QID) | ORAL | Status: DC | PRN

## 2011-08-01 MED ORDER — POLYVINYL ALCOHOL 1.4 % OP SOLN: 1.0000 [drp] | Freq: Every day | OPHTHALMIC | Status: DC | PRN

## 2011-08-01 MED ORDER — ENOXAPARIN SODIUM 120 MG/0.8ML ~~LOC~~ SOLN: 120.0000 mg | SUBCUTANEOUS | Status: DC

## 2011-08-01 MED ORDER — ASPIRIN 81 MG PO TBEC: 81.0000 mg | DELAYED_RELEASE_TABLET | Freq: Every day | ORAL | Status: DC

## 2011-08-01 MED ORDER — PYRIDOSTIGMINE BROMIDE 60 MG PO TABS
60.0000 mg | ORAL_TABLET | Freq: Three times a day (TID) | ORAL | Status: DC
  Administered 2011-08-01: 60 mg via ORAL
  Filled 2011-08-01 (×3): qty 1

## 2011-08-01 MED ORDER — WARFARIN SODIUM 5 MG PO TABS: 7.5000 mg | ORAL_TABLET | Freq: Every day | ORAL | Status: DC

## 2011-08-01 MED ORDER — CALCIUM CARBONATE-VITAMIN D 500-200 MG-UNIT PO TABS
1.0000 | ORAL_TABLET | Freq: Every day | ORAL | Status: DC
  Administered 2011-08-01: 1 via ORAL
  Filled 2011-08-01 (×2): qty 1

## 2011-08-01 NOTE — Progress Notes (Signed)
Raul Del Grinnell to be D/C'd Home per MD order.  Discussed with the patient and all questions fully answered.   Hudson, Majkowski  Home Medication Instructions ZOX:[id number]   Printed on:08/01/11 1210  Medication Information                    imatinib (GLEEVEC) 400 MG tablet Take 400 mg by mouth daily.             esomeprazole (NEXIUM) 40 MG capsule Take 40 mg by mouth daily before breakfast.             promethazine (PHENERGAN) 25 MG tablet Take 25 mg by mouth daily as needed. For nausea.           Multiple Vitamin (MULTIVITAMIN) tablet Take 1 tablet by mouth daily.             ondansetron (ZOFRAN) 4 MG tablet Take 4 mg by mouth every 6 (six) hours as needed. For nausea.           lisinopril (PRINIVIL,ZESTRIL) 5 MG tablet Take 1 tablet (5 mg total) by mouth daily.           isosorbide mononitrate (IMDUR) 30 MG 24 hr tablet Take 1 tablet (30 mg total) by mouth daily.           metFORMIN (GLUCOPHAGE-XR) 500 MG 24 hr tablet Take 500 mg by mouth daily with breakfast.           predniSONE (DELTASONE) 20 MG tablet Take 20 mg by mouth daily.           LORazepam (ATIVAN) 0.5 MG tablet Take 0.5 mg by mouth at bedtime as needed. For sleep.           metoprolol succinate (TOPROL-XL) 25 MG 24 hr tablet Take 25 mg by mouth daily.           rosuvastatin (CRESTOR) 10 MG tablet Take 10 mg by mouth daily.           pyridostigmine (MESTINON) 60 MG tablet Take 60 mg by mouth 3 (three) times daily.           Terbinafine HCl ([facility] JOCK ITCH EX) Apply 1 application topically daily as needed. For jock itch           hydroxypropyl methylcellulose (ISOPTO TEARS) 2.5 % ophthalmic solution Place 1 drop into both eyes daily as needed. For dry eyes           calcium-vitamin D (OSCAL WITH D) 500-200 MG-UNIT per tablet Take 1 tablet by mouth daily.           acetaminophen (TYLENOL) 325 MG tablet Take 2 tablets (650 mg total) by mouth every 6 (six) hours as needed (or Fever >/= 101).             aspirin EC 81 MG EC tablet Take 1 tablet (81 mg total) by mouth daily.           enoxaparin (LOVENOX) 120 MG/0.8ML injection Inject 0.8 mLs (120 mg total) into the skin daily.           warfarin (COUMADIN) 5 MG tablet Take 1.5 tablets (7.5 mg total) by mouth daily.             VVS, Skin clean, dry and intact without evidence of skin break down, no evidence of skin tears noted. IV catheter discontinued intact. Site without signs and symptoms of complications. Dressing and pressure applied.  An After  Visit Summary was printed and given to the patient. Follow up appointments , new prescriptions and medication administration times given. Pt observed to be able to self administer lovenox injection. Coumadin teaching completed and teach back done. Patient escorted via WC, and D/C home via private auto accompanied by wife.  Cindra Eves, RN 08/01/2011 12:10 PM

## 2011-08-01 NOTE — Discharge Summary (Signed)
PATIENT DETAILS Name: [name] Age: 71 y.o. Sex: male Date of Birth: 10/01/1940 MRN: [id number]. Admit Date: 07/30/2011 Admitting Physician: Ron Parker, MD NWG:[id number], Gregary Signs, MD  PRIMARY DISCHARGE DIAGNOSIS:  Principal Problem:  *Pulmonary embolism Active Problems:  LEUKEMIA, MYELOID, CHRONIC  HYPERLIPIDEMIA, MIXED  HYPERTENSION  CORONARY ARTERY DISEASE  DVT of lower extremity (deep venous thrombosis)  Hyperglycemia  Diabetes mellitus      PAST MEDICAL HISTORY: Past Medical History  Diagnosis Date  . Coronary atherosclerosis of unspecified type of vessel, native or graft   . Mixed hyperlipidemia   . Glucose intolerance (impaired glucose tolerance)     history of  . Diverticulosis of colon (without mention of hemorrhage)   . Hx of colonic polyps   . Internal hemorrhoids without mention of complication   . External hemorrhoids without mention of complication   . Rash and other nonspecific skin eruption   . Pneumonia, organism unspecified   . Conjunctivitis unspecified   . Nephrolithiasis   . Chronic myeloid leukemia, without mention of having achieved remission     DISCHARGE MEDICATIONS: Medication List  As of 08/01/2011 10:37 AM   STOP taking these medications         clopidogrel 75 MG tablet         TAKE these medications         acetaminophen 325 MG tablet   Commonly known as: TYLENOL   Take 2 tablets (650 mg total) by mouth every 6 (six) hours as needed (or Fever >/= 101).      aspirin 81 MG EC tablet   Take 1 tablet (81 mg total) by mouth daily.      calcium-vitamin D 500-200 MG-UNIT per tablet   Commonly known as: OSCAL WITH D   Take 1 tablet by mouth daily.      [employer] JOCK ITCH EX   Apply 1 application topically daily as needed. For jock itch      enoxaparin 120 MG/0.8ML injection   Commonly known as: LOVENOX   Inject 0.8 mLs (120 mg total) into the skin daily.      esomeprazole 40 MG capsule   Commonly known as: NEXIUM   Take 40 mg  by mouth daily before breakfast.      hydroxypropyl methylcellulose 2.5 % ophthalmic solution   Commonly known as: ISOPTO TEARS   Place 1 drop into both eyes daily as needed. For dry eyes      imatinib 400 MG tablet   Commonly known as: GLEEVEC   Take 400 mg by mouth daily.      isosorbide mononitrate 30 MG 24 hr tablet   Commonly known as: IMDUR   Take 1 tablet (30 mg total) by mouth daily.      lisinopril 5 MG tablet   Commonly known as: PRINIVIL,ZESTRIL   Take 1 tablet (5 mg total) by mouth daily.      LORazepam 0.5 MG tablet   Commonly known as: ATIVAN   Take 0.5 mg by mouth at bedtime as needed. For sleep.      metFORMIN 500 MG 24 hr tablet   Commonly known as: GLUCOPHAGE-XR   Take 500 mg by mouth daily with breakfast.      metoprolol succinate 25 MG 24 hr tablet   Commonly known as: TOPROL-XL   Take 25 mg by mouth daily.      multivitamin tablet   Take 1 tablet by mouth daily.      ondansetron 4 MG tablet  Commonly known as: ZOFRAN   Take 4 mg by mouth every 6 (six) hours as needed. For nausea.      predniSONE 20 MG tablet   Commonly known as: DELTASONE   Take 20 mg by mouth daily.      promethazine 25 MG tablet   Commonly known as: PHENERGAN   Take 25 mg by mouth daily as needed. For nausea.      pyridostigmine 60 MG tablet   Commonly known as: MESTINON   Take 60 mg by mouth 3 (three) times daily.      rosuvastatin 10 MG tablet   Commonly known as: CRESTOR   Take 10 mg by mouth daily.      warfarin 5 MG tablet   Commonly known as: COUMADIN   Take 1.5 tablets (7.5 mg total) by mouth daily.             BRIEF HPI:  See H&P, Labs, Consult and Test reports for all details in brief, patient was admitted for **  CONSULTATIONS:   Phone consultation with Dr's Truett Perna and Riley Kill  PERTINENT RADIOLOGIC STUDIES: Ct Angio Chest W/cm &/or Wo Cm  07/30/2011  *RADIOLOGY REPORT*  Clinical Data: Shortness breath.  Chest pain.  Left leg DVT. Chronic  myelogenous leukemia.  CT ANGIOGRAPHY CHEST  Technique:  Multidetector CT imaging of the chest using the standard protocol during bolus administration of intravenous contrast. Multiplanar reconstructed images including MIPs were obtained and reviewed to evaluate the vascular anatomy.  Contrast: OMNIPAQUE IOHEXOL 350 MG/ML SOLN  Comparison: 03/24/2010  Findings: Mild pulmonary embolism is seen in the distal right pulmonary artery as well as the right middle, right lower, and left lower lobar pulmonary arteries.  No evidence of thoracic aortic aneurysm or dissection.  No evidence of mediastinal mass or lymphadenopathy.  No evidence of pleural or pericardial effusion.  Mild bibasilar scarring is seen.  No evidence of pulmonary infiltrate or mass.  IMPRESSION:  Positive for bilateral pulmonary embolism.  No other acute findings.  Critical Value/emergent results were called by telephone at the time of interpretation on 07/30/2011  at 2030 hours  to  Dr. Ignacia Palma in the emergency department, who verbally acknowledged these results.  Original Report Authenticated By: Danae Orleans, M.D.     PERTINENT LAB RESULTS: CBC:  Basename 08/01/11 0604 07/31/11 0520  WBC 9.1 9.8  HGB 12.5* 12.4*  HCT 36.1* 36.6*  PLT 190 164   CMET CMP     Component Value Date/Time   NA 143 07/31/2011 0520   K 3.8 07/31/2011 0520   CL 106 07/31/2011 0520   CO2 26 07/31/2011 0520   GLUCOSE 91 07/31/2011 0520   BUN 16 07/31/2011 0520   CREATININE 1.34 07/31/2011 0520   CALCIUM 8.4 07/31/2011 0520   CALCIUM 8.9 08/04/2010 0836   PROT 6.4 07/30/2011 1506   ALBUMIN 3.9 07/30/2011 1506   AST 29 07/30/2011 1506   ALT 35 07/30/2011 1506   ALKPHOS 54 07/30/2011 1506   BILITOT 0.4 07/30/2011 1506   GFRNONAA 51* 07/31/2011 0520   GFRAA 59* 07/31/2011 0520    GFR Estimated Creatinine Clearance: 47.5 ml/min (by C-G formula based on Cr of 1.34). No results found for this basename: LIPASE:2,AMYLASE:2 in the last 72 hours No results found  for this basename: CKTOTAL:3,CKMB:3,CKMBINDEX:3,TROPONINI:3 in the last 72 hours No components found with this basename: POCBNP:3 No results found for this basename: DDIMER:2 in the last 72 hours  Basename 07/30/11 2105  HGBA1C 5.9*  No results found for this basename: CHOL:2,HDL:2,LDLCALC:2,TRIG:2,CHOLHDL:2,LDLDIRECT:2 in the last 72 hours No results found for this basename: TSH,T4TOTAL,FREET3,T3FREE,THYROIDAB in the last 72 hours No results found for this basename: VITAMINB12:2,FOLATE:2,FERRITIN:2,TIBC:2,IRON:2,RETICCTPCT:2 in the last 72 hours Coags:  Basename 08/01/11 0604 07/30/11 1634  INR 1.02 0.92   Microbiology: No results found for this or any previous visit (from the past 240 hour(s)).   BRIEF HOSPITAL COURSE:   Principal Problem: *Pulmonary embolism with DVT  -on admission was placed on heparin infusion,I did speak with Dr Sherrill-Oncology over phone, patient's CML in remission, so no need for long term Lovenox therapy, ok to treat with coumadin long term. Has has GI issues-diarrhea mostly in the past, therefore will plan to treat with Coumadin and overlapping Lovenox  - coumadin and lovenox teaching has been done, patient has learnt how to inject self with lovenox -INR check with [facility] has been arranged for 6/24 -patient will be discharged home today with overlapping coumadin and lovenox, he has been instructed to continue with lovenox till INR therapeutic. -I have called his pharmacy, confirmed Lovenox stores and copay, and I have called in the prescriptions for both lovenox and coumadin already  Active Problems: CAD  -no h/o PCI in the past  -on Plavix, spoke with Dr Riley Kill on the phone, since patient will be on anticoagulation-he suggests switching to ASA  -c/w statin and beta blockers  LEUKEMIA, MYELOID, CHRONIC  -in remission  -c/w Gleevac  -outpatient f/u with Dr Truett Perna and also with Oncology at [facility], MIXED  -c/w  statin   HYPERTENSION  -BP with good control  -continue with Imdur, Metoprolol and Lisinopril   Diabetes mellitus  -CBG's conrolled  -resume Metformin on discharge   GERD  -PPI   Anxiety  -as needed Lorazepam   Myasthenia Gravis  -on mestinon and chronic prednisone therapy for this-continue   TODAY-DAY OF DISCHARGE:  Subjective:   [place] today has no headache,no chest abdominal pain,no new weakness tingling or numbness, feels much better wants to go home today. His left calf pain is now minimal and SOB is significantly better. Patient is ambulating independently in the room.  Objective:   Blood pressure 145/71, pulse 84, temperature 98.1 F (36.7 C), temperature source Oral, resp. rate 18, height 5\' 8"  (1.727 m), weight 79.9 kg (176 lb 2.4 oz), SpO2 97.00%.  Intake/Output Summary (Last 24 hours) at 08/01/11 1037 Last data filed at 08/01/11 0900  Gross per 24 hour  Intake    942 ml  Output      0 ml  Net    942 ml    Exam Awake Alert, Oriented *3, No new F.N deficits, Normal affect [place].AT,PERRAL Supple Neck,No JVD, No cervical lymphadenopathy appriciated.  Symmetrical Chest wall movement, Good air movement bilaterally, CTAB RRR,No Gallops,Rubs or new Murmurs, No Parasternal Heave +ve B.Sounds, Abd Soft, Non tender, No organomegaly appriciated, No rebound -guarding or rigidity. No Cyanosis, Clubbing or edema, No new Rash or bruise  DISPOSITION: Home  DISCHARGE INSTRUCTIONS:    Follow-up Information    Follow up with [facility] on 08/03/2011. (2:30)    Contact information:   [address] [place] [phone]      Follow up with Romero Belling, MD. Schedule an appointment as soon as possible for a visit in 1 week.   Contact information:   [address] [facility] [address]th [address] [place] [address] [phone]       Follow  up with Thornton Papas, MD. (keep next appointment)    Contact information:   [address]  [place] [address] [phone]       Follow up with Shawnie Pons, MD. (keep next appointment)    Contact information:   [address] [place] [address] [phone]           Total Time spent on discharge equals 45 minutes.  SignedJeoffrey Massed 08/01/2011 10:37 AM

## 2011-08-01 NOTE — Progress Notes (Signed)
Pt watched lovenox video 108 & 109. Pt understood the teaching

## 2011-08-03 ENCOUNTER — Ambulatory Visit (INDEPENDENT_AMBULATORY_CARE_PROVIDER_SITE_OTHER): Payer: Medicare Other

## 2011-08-03 DIAGNOSIS — I824Z9 Acute embolism and thrombosis of unspecified deep veins of unspecified distal lower extremity: Secondary | ICD-10-CM

## 2011-08-03 DIAGNOSIS — Z7901 Long term (current) use of anticoagulants: Secondary | ICD-10-CM | POA: Diagnosis not present

## 2011-08-03 DIAGNOSIS — I2699 Other pulmonary embolism without acute cor pulmonale: Secondary | ICD-10-CM

## 2011-08-03 DIAGNOSIS — I82409 Acute embolism and thrombosis of unspecified deep veins of unspecified lower extremity: Secondary | ICD-10-CM

## 2011-08-03 NOTE — Patient Instructions (Signed)

## 2011-08-05 ENCOUNTER — Ambulatory Visit: Payer: Medicare Other | Admitting: Gastroenterology

## 2011-08-06 ENCOUNTER — Ambulatory Visit (INDEPENDENT_AMBULATORY_CARE_PROVIDER_SITE_OTHER): Payer: Medicare Other | Admitting: Endocrinology

## 2011-08-06 ENCOUNTER — Encounter: Payer: Self-pay | Admitting: Endocrinology

## 2011-08-06 ENCOUNTER — Ambulatory Visit (INDEPENDENT_AMBULATORY_CARE_PROVIDER_SITE_OTHER): Payer: Medicare Other

## 2011-08-06 VITALS — BP 118/62 | HR 65 | Temp 97.5°F | Ht 68.0 in | Wt 176.0 lb

## 2011-08-06 DIAGNOSIS — Z7901 Long term (current) use of anticoagulants: Secondary | ICD-10-CM

## 2011-08-06 DIAGNOSIS — I824Z9 Acute embolism and thrombosis of unspecified deep veins of unspecified distal lower extremity: Secondary | ICD-10-CM | POA: Diagnosis not present

## 2011-08-06 DIAGNOSIS — I2699 Other pulmonary embolism without acute cor pulmonale: Secondary | ICD-10-CM | POA: Diagnosis not present

## 2011-08-06 DIAGNOSIS — I82409 Acute embolism and thrombosis of unspecified deep veins of unspecified lower extremity: Secondary | ICD-10-CM

## 2011-08-06 NOTE — Patient Instructions (Addendum)
Let's check a treadmill (heart) test.   If this is negative, you should see your orthopedic dr for this.

## 2011-08-06 NOTE — Progress Notes (Signed)
Subjective:    Patient ID: [name], male    DOB: [birth year], 71 y.o.   MRN: [id number]  HPI Pt states few weeks of intermittent moderate pain at the right shoulder.  No assoc chest pain.  It is worse with activity.  Tramadol helps.  He has had surgery there in the past.  He last saw dr Riley Kill 3 mos ago.   Past Medical History  Diagnosis Date  . Coronary atherosclerosis of unspecified type of vessel, native or graft   . Mixed hyperlipidemia   . Glucose intolerance (impaired glucose tolerance)     history of  . Diverticulosis of colon (without mention of hemorrhage)   . Hx of colonic polyps   . Internal hemorrhoids without mention of complication   . External hemorrhoids without mention of complication   . Rash and other nonspecific skin eruption   . Pneumonia, organism unspecified   . Conjunctivitis unspecified   . Nephrolithiasis   . 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 Angioplasty 2001  . Back surgery     x2  . Tonsillectomy     History   Social History  . Marital Status: Married    Spouse Name: N/A    Number of Children: N/A  . Years of Education: N/A   Occupational History  . retired    Social History Main Topics  . Smoking status: Never Smoker   . Smokeless tobacco: Not on file  . Alcohol Use: No  . Drug Use: No  . Sexually Active: Not on file   Other Topics Concern  . Not on file   Social History Narrative  . No narrative on file    Current Outpatient Prescriptions on File Prior to Visit  Medication Sig Dispense Refill  . acetaminophen (TYLENOL) 325 MG tablet Take 2 tablets (650 mg total) by mouth every 6 (six) hours as needed (or Fever >/= 101).      Marland Kitchen aspirin EC 81 MG EC tablet Take 1 tablet (81 mg total) by mouth daily.      . calcium-vitamin D (OSCAL WITH D) 500-200 MG-UNIT per tablet Take 1 tablet by mouth daily.      Marland Kitchen esomeprazole (NEXIUM) 40 MG capsule Take  40 mg by mouth daily before breakfast.        . hydroxypropyl methylcellulose (ISOPTO TEARS) 2.5 % ophthalmic solution Place 1 drop into both eyes daily as needed. For dry eyes      . imatinib (GLEEVEC) 400 MG tablet Take 400 mg by mouth daily.        . isosorbide mononitrate (IMDUR) 30 MG 24 hr tablet Take 1 tablet (30 mg total) by mouth daily.  90 tablet  3  . lisinopril (PRINIVIL,ZESTRIL) 5 MG tablet Take 1 tablet (5 mg total) by mouth daily.  90 tablet  3  . LORazepam (ATIVAN) 0.5 MG tablet Take 0.5 mg by mouth at bedtime as needed. For sleep.      . metFORMIN (GLUCOPHAGE-XR) 500 MG 24 hr tablet Take 500 mg by mouth daily with breakfast.      . metoprolol succinate (TOPROL-XL) 25 MG 24 hr tablet Take 25 mg by mouth daily.      . Multiple Vitamin (MULTIVITAMIN) tablet Take 1 tablet by mouth daily.        . ondansetron (ZOFRAN) 4 MG tablet Take 4 mg by mouth every 6 (six) hours as needed. For  nausea.      . predniSONE (DELTASONE) 20 MG tablet Take 20 mg by mouth daily.      . promethazine (PHENERGAN) 25 MG tablet Take 25 mg by mouth daily as needed. For nausea.      Marland Kitchen pyridostigmine (MESTINON) 60 MG tablet Take 60 mg by mouth 3 (three) times daily.      . rosuvastatin (CRESTOR) 10 MG tablet Take 10 mg by mouth daily.      Marland Kitchen warfarin (COUMADIN) 5 MG tablet Take as directed by Anticoagulation clinic  30 tablet  2  . DISCONTD: zolpidem (AMBIEN) 10 MG tablet Take 10 mg by mouth at bedtime as needed.         Allergies  Allergen Reactions  . Oxycodone-Acetaminophen Other (See Comments)    Went "out of head"  . Sulfamethoxazole Rash    Family History  Problem Relation Age of Onset  . Heart disease Mother   . Heart disease Father   . Cancer Sister     Breast Cancer-Twin     BP 118/62  Pulse 65  Temp 97.5 F (36.4 C) (Oral)  Ht 5\' 8"  (1.727 m)  Wt 176 lb (79.833 kg)  BMI 26.76 kg/m2  SpO2 97%   Review of Systems Left leg edema and pain persist, but they are improved.      Objective:   Physical Exam VITAL SIGNS:  See vs page GENERAL: no distress Left shoulder: full ROM without pain Left leg: 1+ edema, otherwise normal. Gait: normal and painless.       Assessment & Plan:  Shoulder pain, new, uncertain etiology

## 2011-08-10 ENCOUNTER — Encounter: Payer: Medicare Other | Admitting: Endocrinology

## 2011-08-12 ENCOUNTER — Ambulatory Visit (INDEPENDENT_AMBULATORY_CARE_PROVIDER_SITE_OTHER): Payer: Medicare Other | Admitting: Pharmacist

## 2011-08-12 ENCOUNTER — Ambulatory Visit: Payer: Medicare Other | Admitting: Nurse Practitioner

## 2011-08-12 DIAGNOSIS — I2699 Other pulmonary embolism without acute cor pulmonale: Secondary | ICD-10-CM

## 2011-08-12 DIAGNOSIS — I82409 Acute embolism and thrombosis of unspecified deep veins of unspecified lower extremity: Secondary | ICD-10-CM

## 2011-08-12 DIAGNOSIS — I824Z9 Acute embolism and thrombosis of unspecified deep veins of unspecified distal lower extremity: Secondary | ICD-10-CM

## 2011-08-12 DIAGNOSIS — Z7901 Long term (current) use of anticoagulants: Secondary | ICD-10-CM

## 2011-08-12 LAB — POCT INR: INR: 4.6

## 2011-08-12 MED ORDER — WARFARIN SODIUM 5 MG PO TABS: ORAL_TABLET | ORAL | Status: DC

## 2011-08-14 ENCOUNTER — Ambulatory Visit (INDEPENDENT_AMBULATORY_CARE_PROVIDER_SITE_OTHER): Payer: Medicare Other | Admitting: Nurse Practitioner

## 2011-08-14 ENCOUNTER — Encounter: Payer: Medicare Other | Admitting: Nurse Practitioner

## 2011-08-14 ENCOUNTER — Encounter: Payer: Self-pay | Admitting: Nurse Practitioner

## 2011-08-14 VITALS — BP 124/62 | HR 76 | Ht 68.0 in | Wt 179.0 lb

## 2011-08-14 DIAGNOSIS — I2699 Other pulmonary embolism without acute cor pulmonale: Secondary | ICD-10-CM | POA: Diagnosis not present

## 2011-08-14 DIAGNOSIS — I10 Essential (primary) hypertension: Secondary | ICD-10-CM | POA: Diagnosis not present

## 2011-08-14 DIAGNOSIS — I2510 Atherosclerotic heart disease of native coronary artery without angina pectoris: Secondary | ICD-10-CM | POA: Diagnosis not present

## 2011-08-14 DIAGNOSIS — C9210 Chronic myeloid leukemia, BCR/ABL-positive, not having achieved remission: Secondary | ICD-10-CM | POA: Diagnosis not present

## 2011-08-14 NOTE — Patient Instructions (Addendum)
We are going to arrange for a Myoview (Lexiscan) in about 7 to 10 days  Stay on your current medicines  I will see you back after your study is complete on a day that Dr. Riley Kill is here.   If your cough continues I will change your medicines when I see you back  No heavy activities. No mowing. Avoid the heat.   Call the [facility] office at [phone] if you have any questions, problems or concerns.

## 2011-08-14 NOTE — Assessment & Plan Note (Signed)
He is on coumadin. Still with some left leg pain. Would continue to restrict him from strenuous activities. GXT has been cancelled.

## 2011-08-14 NOTE — Progress Notes (Signed)
[name] Date of Birth: 1941-01-11 Medical Record #[id number]  History of Present Illness: [name] is seen today for a work in visit. He is seen for Dr. Riley Kill. He has multiple medical issues which include known CAD from remote cath in 2003; HTN, HLD, DM, leukemia, myasthenia, and chronic GERD. He has recently had left leg pain and swelling. Noted to have DVT with bilateral PE. Has been on Coumadin for about 2 weeks now.   He comes in today. He is here with his wife. He is not sure why he is really here. No active chest pain. Does have some DOE. Some cough that is dry and hacky. Has some pain between the shoulder blades. Does have "indigestion" and is on Nexium. He does note a signficant change in his energy level that started before the PE were found. No real etiology reported for his DVT/PE. He is on chronic chemo for his leukemia and says he is in remission. He sees Dr. Truett Perna later this month. His wife notes a significant change in his endurance and will sometimes see him rubbing his chest. She thinks his indigestion is out of proportion. He still has some pain and mild swelling in the left leg. No pain with deep inspiration.   Current Outpatient Prescriptions on File Prior to Visit  Medication Sig Dispense Refill  . acetaminophen (TYLENOL) 325 MG tablet Take 2 tablets (650 mg total) by mouth every 6 (six) hours as needed (or Fever >/= 101).      Marland Kitchen aspirin EC 81 MG EC tablet Take 1 tablet (81 mg total) by mouth daily.      . calcium-vitamin D (OSCAL WITH D) 500-200 MG-UNIT per tablet Take 1 tablet by mouth daily.      Marland Kitchen esomeprazole (NEXIUM) 40 MG capsule Take 40 mg by mouth daily before breakfast.        . hydroxypropyl methylcellulose (ISOPTO TEARS) 2.5 % ophthalmic solution Place 1 drop into both eyes daily as needed. For dry eyes      . imatinib (GLEEVEC) 400 MG tablet Take 400 mg by mouth daily.        . isosorbide mononitrate (IMDUR) 30 MG 24 hr tablet Take 1 tablet (30 mg  total) by mouth daily.  90 tablet  3  . lisinopril (PRINIVIL,ZESTRIL) 5 MG tablet Take 1 tablet (5 mg total) by mouth daily.  90 tablet  3  . LORazepam (ATIVAN) 0.5 MG tablet Take 0.5 mg by mouth at bedtime as needed. For sleep.      . metFORMIN (GLUCOPHAGE-XR) 500 MG 24 hr tablet Take 500 mg by mouth daily with breakfast.      . metoprolol succinate (TOPROL-XL) 25 MG 24 hr tablet Take 25 mg by mouth daily.      . Multiple Vitamin (MULTIVITAMIN) tablet Take 1 tablet by mouth daily.        . ondansetron (ZOFRAN) 4 MG tablet Take 4 mg by mouth every 6 (six) hours as needed. For nausea.      . predniSONE (DELTASONE) 20 MG tablet Take 20 mg by mouth daily.      . promethazine (PHENERGAN) 25 MG tablet Take 25 mg by mouth daily as needed. For nausea.      Marland Kitchen pyridostigmine (MESTINON) 60 MG tablet Take 60 mg by mouth 3 (three) times daily.      . rosuvastatin (CRESTOR) 10 MG tablet Take 10 mg by mouth daily.      . traMADol (ULTRAM) 50 MG  tablet Take 50 mg by mouth every 6 (six) hours as needed.      . warfarin (COUMADIN) 5 MG tablet Take as directed by Anticoagulation clinic  30 tablet  2  . DISCONTD: zolpidem (AMBIEN) 10 MG tablet Take 10 mg by mouth at bedtime as needed.         Allergies  Allergen Reactions  . Oxycodone-Acetaminophen Other (See Comments)    Went "out of head"  . Sulfamethoxazole Rash    Past Medical History  Diagnosis Date  . Coronary atherosclerosis of unspecified type of vessel, native or graft     remote cath in 2003 showing distal and branch vessel CAD with normal LV function; managed medically  . Mixed hyperlipidemia   . Glucose intolerance (impaired glucose tolerance)     history of  . Diverticulosis of colon (without mention of hemorrhage)   . Hx of colonic polyps   . Internal hemorrhoids without mention of complication   . External hemorrhoids without mention of complication   . Rash and other nonspecific skin eruption   . Pneumonia, organism unspecified   .  Conjunctivitis unspecified   . Nephrolithiasis   . Chronic myeloid leukemia, without mention of having achieved remission   . Myasthenia gravis     uses prednisone  . GERD (gastroesophageal reflux disease)   . Pulmonary embolism June 2013  . DVT (deep venous thrombosis) June 2013  . Chronic anticoagulation   . Diabetes mellitus     Past Surgical History  Procedure Date  . Angioplasty 2001  . Back surgery     x2  . Tonsillectomy     History  Smoking status  . Never Smoker   Smokeless tobacco  . Never Used    History  Alcohol Use No    Family History  Problem Relation Age of Onset  . Heart disease Mother   . Heart attack Mother   . Stroke Mother   . Heart disease Father   . Heart attack Father   . Stroke Father   . Cancer Sister     Breast Cancer-Twin     Review of Systems: The review of systems is per the HPI.  He has chronic issues with his eyes from his myasthenia. Has lots of GI issues that are chronic. No fever or chills. All other systems were reviewed and are negative.  Physical Exam: BP 124/62  Pulse 76  Ht 5\' 8"  (1.727 m)  Wt 179 lb (81.194 kg)  BMI 27.22 kg/m2 Patient is very pleasant and in no acute distress. Skin is warm and dry. Color is normal.  HEENT is unremarkable. Normocephalic/atraumatic. PERRL. Sclera are nonicteric. Neck is supple. No masses. No JVD. Lungs are clear. Cardiac exam shows a regular rate and rhythm. Abdomen is soft. Extremities are without edema. Gait and ROM are intact. No gross neurologic deficits noted.  LABORATORY DATA:  EKG today shows sinus rhythm with a PAC. It is normal.   Assessment / Plan:

## 2011-08-14 NOTE — Assessment & Plan Note (Signed)
He is seeing Dr. Truett Perna later this month. He is going to discuss with him the possibility of his chemo agent causing his DVT/PE.

## 2011-08-14 NOTE — Assessment & Plan Note (Signed)
Blood pressure looks ok. He is complaining of a dry, hacky cough. If it persists when I see him back,  will consider changing his ACE to ARB.

## 2011-08-14 NOTE — Assessment & Plan Note (Signed)
The patient has known CAD per remote cath. Has multiple risk factors as well. Now with decreasing endurance and more fatigue that he feels is out of proportion. No active chest pain but is having indigestion. He is on chronic PPI therapy. I had already cancelled his GXT in light of his new diagnosis of DVT/PE. Will arrange for a Lexiscan. I will see him back in follow up. For now, no change in his medicines. Patient is agreeable to this plan and will call if any problems develop in the interim.

## 2011-08-18 ENCOUNTER — Other Ambulatory Visit: Payer: Self-pay | Admitting: Gastroenterology

## 2011-08-19 ENCOUNTER — Ambulatory Visit (INDEPENDENT_AMBULATORY_CARE_PROVIDER_SITE_OTHER): Payer: Medicare Other | Admitting: Pharmacist

## 2011-08-19 DIAGNOSIS — I2699 Other pulmonary embolism without acute cor pulmonale: Secondary | ICD-10-CM

## 2011-08-19 DIAGNOSIS — I82409 Acute embolism and thrombosis of unspecified deep veins of unspecified lower extremity: Secondary | ICD-10-CM

## 2011-08-19 DIAGNOSIS — Z7901 Long term (current) use of anticoagulants: Secondary | ICD-10-CM

## 2011-08-19 DIAGNOSIS — I824Z9 Acute embolism and thrombosis of unspecified deep veins of unspecified distal lower extremity: Secondary | ICD-10-CM | POA: Diagnosis not present

## 2011-08-20 ENCOUNTER — Other Ambulatory Visit: Payer: Self-pay | Admitting: *Deleted

## 2011-08-20 MED ORDER — ISOSORBIDE MONONITRATE ER 30 MG PO TB24: 30.0000 mg | ORAL_TABLET | Freq: Every day | ORAL | Status: DC

## 2011-08-26 ENCOUNTER — Other Ambulatory Visit (INDEPENDENT_AMBULATORY_CARE_PROVIDER_SITE_OTHER): Payer: Medicare Other

## 2011-08-26 ENCOUNTER — Ambulatory Visit (HOSPITAL_COMMUNITY): Payer: Medicare Other | Attending: Cardiology | Admitting: Radiology

## 2011-08-26 ENCOUNTER — Ambulatory Visit (INDEPENDENT_AMBULATORY_CARE_PROVIDER_SITE_OTHER): Payer: Medicare Other | Admitting: *Deleted

## 2011-08-26 VITALS — BP 133/71 | HR 64 | Ht 68.0 in | Wt 176.0 lb

## 2011-08-26 DIAGNOSIS — R42 Dizziness and giddiness: Secondary | ICD-10-CM | POA: Insufficient documentation

## 2011-08-26 DIAGNOSIS — I2699 Other pulmonary embolism without acute cor pulmonale: Secondary | ICD-10-CM

## 2011-08-26 DIAGNOSIS — E7800 Pure hypercholesterolemia, unspecified: Secondary | ICD-10-CM

## 2011-08-26 DIAGNOSIS — Z8249 Family history of ischemic heart disease and other diseases of the circulatory system: Secondary | ICD-10-CM | POA: Diagnosis not present

## 2011-08-26 DIAGNOSIS — Z7901 Long term (current) use of anticoagulants: Secondary | ICD-10-CM

## 2011-08-26 DIAGNOSIS — I10 Essential (primary) hypertension: Secondary | ICD-10-CM | POA: Diagnosis not present

## 2011-08-26 DIAGNOSIS — R0789 Other chest pain: Secondary | ICD-10-CM | POA: Insufficient documentation

## 2011-08-26 DIAGNOSIS — R0609 Other forms of dyspnea: Secondary | ICD-10-CM | POA: Diagnosis not present

## 2011-08-26 DIAGNOSIS — E785 Hyperlipidemia, unspecified: Secondary | ICD-10-CM | POA: Diagnosis not present

## 2011-08-26 DIAGNOSIS — R61 Generalized hyperhidrosis: Secondary | ICD-10-CM | POA: Diagnosis not present

## 2011-08-26 DIAGNOSIS — Z79899 Other long term (current) drug therapy: Secondary | ICD-10-CM | POA: Insufficient documentation

## 2011-08-26 DIAGNOSIS — R079 Chest pain, unspecified: Secondary | ICD-10-CM | POA: Diagnosis not present

## 2011-08-26 DIAGNOSIS — I824Z9 Acute embolism and thrombosis of unspecified deep veins of unspecified distal lower extremity: Secondary | ICD-10-CM

## 2011-08-26 DIAGNOSIS — E119 Type 2 diabetes mellitus without complications: Secondary | ICD-10-CM | POA: Insufficient documentation

## 2011-08-26 DIAGNOSIS — R0989 Other specified symptoms and signs involving the circulatory and respiratory systems: Secondary | ICD-10-CM | POA: Insufficient documentation

## 2011-08-26 DIAGNOSIS — R0602 Shortness of breath: Secondary | ICD-10-CM

## 2011-08-26 DIAGNOSIS — R5383 Other fatigue: Secondary | ICD-10-CM | POA: Insufficient documentation

## 2011-08-26 DIAGNOSIS — I4949 Other premature depolarization: Secondary | ICD-10-CM | POA: Diagnosis not present

## 2011-08-26 DIAGNOSIS — I82409 Acute embolism and thrombosis of unspecified deep veins of unspecified lower extremity: Secondary | ICD-10-CM

## 2011-08-26 DIAGNOSIS — I2510 Atherosclerotic heart disease of native coronary artery without angina pectoris: Secondary | ICD-10-CM

## 2011-08-26 DIAGNOSIS — R5381 Other malaise: Secondary | ICD-10-CM | POA: Diagnosis not present

## 2011-08-26 LAB — HEPATIC FUNCTION PANEL
ALT: 42 U/L (ref 0–53)
Albumin: 3.5 g/dL (ref 3.5–5.2)
Alkaline Phosphatase: 43 U/L (ref 39–117)
Total Protein: 5.4 g/dL — ABNORMAL LOW (ref 6.0–8.3)

## 2011-08-26 LAB — LIPID PANEL
Cholesterol: 153 mg/dL (ref 0–200)
HDL: 46.3 mg/dL
LDL Cholesterol: 79 mg/dL (ref 0–99)
Total CHOL/HDL Ratio: 3
Triglycerides: 139 mg/dL (ref 0.0–149.0)
VLDL: 27.8 mg/dL (ref 0.0–40.0)

## 2011-08-26 MED ORDER — TECHNETIUM TC 99M TETROFOSMIN IV KIT
10.0000 | PACK | Freq: Once | INTRAVENOUS | Status: AC | PRN
  Administered 2011-08-26: 10 via INTRAVENOUS

## 2011-08-26 MED ORDER — REGADENOSON 0.4 MG/5ML IV SOLN
0.4000 mg | Freq: Once | INTRAVENOUS | Status: AC
  Administered 2011-08-26: 0.4 mg via INTRAVENOUS

## 2011-08-26 MED ORDER — TECHNETIUM TC 99M TETROFOSMIN IV KIT
30.0000 | PACK | Freq: Once | INTRAVENOUS | Status: AC | PRN
  Administered 2011-08-26: 30 via INTRAVENOUS

## 2011-08-26 NOTE — Progress Notes (Signed)
[facility] SITE 3 NUCLEAR MED [address] [place] [address] [phone]  Cardiology Nuclear Med Study  [name] is a 71 y.o. male     MRN : [id number]     DOB: April 06, 1940  Procedure Date: 08/26/2011  Nuclear Med Background Indication for Stress Test:  Evaluation for Ischemia History:  '02 ION:GEXBMW, EF=63%; '03 Cath:CAD, EF=60-65%, medical tx; DVT/PE 3-weeks ago; Long term chemo Cardiac Risk Factors: Family History - CAD, Hypertension, Lipids and NIDDM  Symptoms:  Chest Pressure with and without Exertion (last episode of chest discomfort was about one week ago), Diaphoresis, Dizziness, DOE and Fatigue   Nuclear Pre-Procedure Caffeine/Decaff Intake:  None NPO After: 10:00pm   Lungs:  clear O2 Sat: 98% on room air. IV 0.9% NS with Angio Cath:  20g  IV Site: R Antecubital  IV Started by:  Stanton Kidney, EMT-P  Chest Size (in):  42 Cup Size: n/a  Height: 5\' 8"  (1.727 m)  Weight:  176 lb (79.833 kg)  BMI:  Body mass index is 26.76 kg/(m^2). Tech Comments:  Toprol held this am, per patient.    Nuclear Med Study 1 or 2 day study: 1 day  Stress Test Type:  Eugenie Birks  Reading MD: [name], MD  Order Authorizing Provider:  Shawnie Pons, MD; Norma Fredrickson, NP  Resting Radionuclide: Technetium 57m Tetrofosmin  Resting Radionuclide Dose: 11.0 mCi   Stress Radionuclide:  Technetium 90m Tetrofosmin  Stress Radionuclide Dose: 33.0 mCi           Stress Protocol Rest HR: 64 Stress HR: 88  Rest BP: 133/71 Stress BP: 131/68  Exercise Time (min): n/a METS: n/a   Predicted Max HR: 147 bpm % Max HR: 59.86 bpm Rate Pressure Product: 41324   Dose of Adenosine (mg):  n/a Dose of Lexiscan: 0.4 mg  Dose of Atropine (mg): n/a Dose of Dobutamine: n/a mcg/kg/min (at max HR)  Stress Test Technologist: Smiley Houseman, CMA-N  Nuclear Technologist:  Domenic Polite, CNMT     Rest Procedure:  Myocardial perfusion imaging was performed at rest 45 minutes following  the intravenous administration of Technetium 36m Tetrofosmin.  Rest ECG: No acute changes, occasional PAC's noted.  Stress Procedure:  The patient received IV Lexiscan 0.4 mg over 15-seconds.  Technetium 97m Tetrofosmin injected at 30-seconds.  There were no significant changes with Lexiscan, occasional PAC's were noted.  Quantitative spect images were obtained after a 45 minute delay.  Stress ECG: No significant change from baseline ECG  QPS Raw Data Images:  Normal; no motion artifact; normal heart/lung ratio. Stress Images:  Normal homogeneous uptake in all areas of the myocardium. Rest Images:  Normal homogeneous uptake in all areas of the myocardium. Subtraction (SDS):  There is no evidence of scar or ischemia. Transient Ischemic Dilatation (Normal <1.22):  1.00 Lung/Heart Ratio (Normal <0.45):  0.21  Quantitative Gated Spect Images QGS EDV:  82 ml QGS ESV:  25 ml  Impression Exercise Capacity:  Lexiscan with no exercise. BP Response:  Normal blood pressure response. Clinical Symptoms:  Chest tightness.  ECG Impression:  No significant ST segment change suggestive of ischemia. Comparison with Prior Nuclear Study: No images to compare  Overall Impression:  Normal stress nuclear study.  LV Ejection Fraction: 70%.  LV Wall Motion:  NL LV Function; NL Wall Motion  [name] 08/26/2011

## 2011-08-27 ENCOUNTER — Telehealth: Payer: Self-pay | Admitting: Oncology

## 2011-08-27 ENCOUNTER — Ambulatory Visit (HOSPITAL_BASED_OUTPATIENT_CLINIC_OR_DEPARTMENT_OTHER): Payer: Medicare Other | Admitting: Oncology

## 2011-08-27 ENCOUNTER — Other Ambulatory Visit: Payer: Medicare Other

## 2011-08-27 ENCOUNTER — Other Ambulatory Visit (HOSPITAL_BASED_OUTPATIENT_CLINIC_OR_DEPARTMENT_OTHER): Payer: Medicare Other | Admitting: Lab

## 2011-08-27 VITALS — BP 125/66 | HR 67 | Temp 97.1°F | Ht 68.0 in | Wt 179.0 lb

## 2011-08-27 DIAGNOSIS — I2699 Other pulmonary embolism without acute cor pulmonale: Secondary | ICD-10-CM | POA: Diagnosis not present

## 2011-08-27 DIAGNOSIS — R110 Nausea: Secondary | ICD-10-CM | POA: Diagnosis not present

## 2011-08-27 DIAGNOSIS — I82409 Acute embolism and thrombosis of unspecified deep veins of unspecified lower extremity: Secondary | ICD-10-CM | POA: Diagnosis not present

## 2011-08-27 DIAGNOSIS — C9210 Chronic myeloid leukemia, BCR/ABL-positive, not having achieved remission: Secondary | ICD-10-CM | POA: Diagnosis not present

## 2011-08-27 DIAGNOSIS — C9211 Chronic myeloid leukemia, BCR/ABL-positive, in remission: Secondary | ICD-10-CM | POA: Diagnosis not present

## 2011-08-27 LAB — CBC WITH DIFFERENTIAL/PLATELET
Basophils Absolute: 0 10*3/uL (ref 0.0–0.1)
EOS%: 0.7 % (ref 0.0–7.0)
HGB: 13 g/dL (ref 13.0–17.1)
MCH: 32.7 pg (ref 27.2–33.4)
NEUT#: 7.9 10*3/uL — ABNORMAL HIGH (ref 1.5–6.5)
RBC: 3.96 10*6/uL — ABNORMAL LOW (ref 4.20–5.82)
RDW: 13.8 % (ref 11.0–14.6)
lymph#: 1 10*3/uL (ref 0.9–3.3)

## 2011-08-27 LAB — COMPREHENSIVE METABOLIC PANEL
ALT: 38 U/L (ref 0–53)
AST: 25 U/L (ref 0–37)
Albumin: 3.8 g/dL (ref 3.5–5.2)
Alkaline Phosphatase: 35 U/L — ABNORMAL LOW (ref 39–117)
BUN: 18 mg/dL (ref 6–23)
Calcium: 9.3 mg/dL (ref 8.4–10.5)
Chloride: 106 mEq/L (ref 96–112)
Potassium: 4.1 mEq/L (ref 3.5–5.3)
Sodium: 144 mEq/L (ref 135–145)
Total Protein: 5.1 g/dL — ABNORMAL LOW (ref 6.0–8.3)

## 2011-08-27 NOTE — Progress Notes (Signed)
   [name]    OFFICE PROGRESS NOTE   INTERVAL HISTORY:   [name] returns as scheduled. [name] was admitted on June 20 with a left lower extremity deep vein thrombosis and bilateral pulmonary embolism. [name] was treated with heparin/Lovenox and is now maintained on Coumadin. The left leg swelling and pain has resolved. [name] has a nonproductive cough. Mild exertional dyspnea.  [name] continues Gleevec. The intermittent diarrhea is stable. [name] has recurrent conjunctival hemorrhages.  [name] denies, so the left lower extremity prior to being diagnosed with a deep vein thrombosis. [name] had "sitting "with his ill sister for 3 months prior to the DVT diagnosis. [name] continues prednisone for treatment of myasthenia gravis.  Objective:  Vital signs in last 24 hours:  Blood pressure 125/66, pulse 67, temperature 97.1 F (36.2 C), temperature source Oral, height 5\' 8"  (1.727 m), weight 179 lb (81.194 kg).    HEENT: Right conjunctival hemorrhage, mild periorbital edema, no thrush or ulcers Lymphatics: No cervical, supraclavicular, axillary, or inguinal nodes Resp: Lungs clear bilaterally Cardio: Regular rate and rhythm GI: No hepatosplenomegaly Vascular: No leg edema   Lab Results:  Lab Results  Component Value Date   WBC 9.8 08/27/2011   HGB 13.0 08/27/2011   HCT 37.8* 08/27/2011   MCV 95.4 08/27/2011   PLT 178 08/27/2011   ANC 7.9    Medications: I have reviewed the patient's current medications.  Assessment/Plan: 1. Chronic myelogenous leukemia.  [name] remains in hematologic and cytogenetic remission. 2. Intermittent nausea and diarrhea, likely related to Gleevec. 3. History of periorbital and extremity edema secondary to Gleevec. 4. Myasthenia gravis-followed by Dr. Sandria Manly 5. Diabetes 6. History of coronary artery disease-[name] had a recent stress test with Dr. Riley Kill 7. Deep vein thrombosis/pulmonary embolism in June 2013-now maintained on Coumadin   Disposition:  [name] remains in  remission from the [facility]. [name] will continue Gleevec.  I am not aware of an Association of Gleevec with venous thromboembolic disease. [name] will continue anticoagulation via the [facility]. In definite anticoagulation should be considered given the lack of a clear predisposing factor for the DVT/PE. I am also not aware of an Association of venous thrombosis with myasthenia gravis.  [name] will see Dr. Lowell Guitar in September. [name] will return for an office visit here in 6 months. Molecular monitoring of the CML is scheduled via Dr. Roanna Banning office   Thornton Papas, MD  08/27/2011  4:06 PM

## 2011-08-27 NOTE — Telephone Encounter (Signed)
Gave pt appt calendar for 02/26/11 lab and MD

## 2011-08-28 ENCOUNTER — Ambulatory Visit (INDEPENDENT_AMBULATORY_CARE_PROVIDER_SITE_OTHER): Payer: Medicare Other | Admitting: Nurse Practitioner

## 2011-08-28 ENCOUNTER — Telehealth: Payer: Self-pay | Admitting: *Deleted

## 2011-08-28 ENCOUNTER — Encounter: Payer: Self-pay | Admitting: Nurse Practitioner

## 2011-08-28 VITALS — BP 128/64 | HR 69 | Ht 68.0 in | Wt 179.8 lb

## 2011-08-28 DIAGNOSIS — I2510 Atherosclerotic heart disease of native coronary artery without angina pectoris: Secondary | ICD-10-CM

## 2011-08-28 DIAGNOSIS — I10 Essential (primary) hypertension: Secondary | ICD-10-CM | POA: Diagnosis not present

## 2011-08-28 DIAGNOSIS — I2699 Other pulmonary embolism without acute cor pulmonale: Secondary | ICD-10-CM

## 2011-08-28 DIAGNOSIS — G7001 Myasthenia gravis with (acute) exacerbation: Secondary | ICD-10-CM | POA: Diagnosis not present

## 2011-08-28 DIAGNOSIS — Z7901 Long term (current) use of anticoagulants: Secondary | ICD-10-CM

## 2011-08-28 DIAGNOSIS — E119 Type 2 diabetes mellitus without complications: Secondary | ICD-10-CM | POA: Diagnosis not present

## 2011-08-28 DIAGNOSIS — H02409 Unspecified ptosis of unspecified eyelid: Secondary | ICD-10-CM | POA: Diagnosis not present

## 2011-08-28 MED ORDER — LOSARTAN POTASSIUM 25 MG PO TABS: 25.0000 mg | ORAL_TABLET | Freq: Every day | ORAL | Status: DC

## 2011-08-28 NOTE — Assessment & Plan Note (Signed)
Blood pressure looks good. Does have a cough. ? ACE. Will give him a trial of Losartan 25 mg to try.

## 2011-08-28 NOTE — Patient Instructions (Signed)
Stop the Lisinopril for a month. Try Losartan 25 mg a day. This may help your cough  Stay on your current medicines  Your stress test looks good.   You may travel to the beach  Gradually increase your activity level  Dr. Riley Kill will see you in September.  Call the [facility] office at [phone] if you have any questions, problems or concerns.

## 2011-08-28 NOTE — Progress Notes (Signed)
[name] Date of Birth: 1940-07-25 Medical Record #[id number]  History of Present Illness: [name] is seen today for a follow up visit. He is seen for Dr. Riley [name]. He has multiple medical issues which include known CAD from remote cath in 2003, HTN, HLD, DM, leukemia, myasthenia, and chronic GERD on Nexium. Had documented DVT and PE a month ago and now on coumadin. Etiology not defined.   He comes in today. He is here with his wife. He is doing ok. No angina. Still fatigues easilty and some DOE. Has had his Myoview that was normal. No ischemia. EF 70%. His left leg continues to improve and he has minimal soreness. Still with a cough. ? ACE related. No fever or chills. Saw Dr. Truett [name] yesterday. It was his opinion that his Gleevec was not the cause for his PE/DVT and that he will probably require indefinite anticoagulation.   Current Outpatient Prescriptions on File Prior to Visit  Medication Sig Dispense Refill  . acetaminophen (TYLENOL) 325 MG tablet Take 2 tablets (650 mg total) by mouth every 6 (six) hours as needed (or Fever >/= 101).      Marland Kitchen aspirin EC 81 MG EC tablet Take 1 tablet (81 mg total) by mouth daily.      . calcium-vitamin D (OSCAL WITH D) 500-200 MG-UNIT per tablet Take 1 tablet by mouth daily.      . hydroxypropyl methylcellulose (ISOPTO TEARS) 2.5 % ophthalmic solution Place 1 drop into both eyes daily as needed. For dry eyes       . imatinib (GLEEVEC) 400 MG tablet Take 400 mg by mouth daily.        . isosorbide mononitrate (IMDUR) 30 MG 24 hr tablet Take 1 tablet (30 mg total) by mouth daily.  90 tablet  3  . lisinopril (PRINIVIL,ZESTRIL) 5 MG tablet Take 1 tablet (5 mg total) by mouth daily.  90 tablet  3  . LORazepam (ATIVAN) 0.5 MG tablet Take 0.5 mg by mouth at bedtime as needed. For sleep.       . metFORMIN (GLUCOPHAGE-XR) 500 MG 24 hr tablet Take 500 mg by mouth daily with breakfast.      . metoprolol succinate (TOPROL-XL) 25 MG 24 hr tablet Take 25 mg by  mouth daily.      . Multiple Vitamin (MULTIVITAMIN) tablet Take 1 tablet by mouth daily.        Marland Kitchen NEXIUM 40 MG capsule TAKE 1 CAPSULE EVERY MORNING  90 each  3  . ondansetron (ZOFRAN) 4 MG tablet Take 4 mg by mouth every 6 (six) hours as needed. For nausea.      . promethazine (PHENERGAN) 25 MG tablet Take 25 mg by mouth daily as needed. For nausea.      Marland Kitchen pyridostigmine (MESTINON) 60 MG tablet Take 60 mg by mouth daily.       . rosuvastatin (CRESTOR) 10 MG tablet Take 10 mg by mouth daily.      . traMADol (ULTRAM) 50 MG tablet Take 50 mg by mouth every 6 (six) hours as needed.       . warfarin (COUMADIN) 5 MG tablet Take as directed by Anticoagulation clinic  30 tablet  2  . DISCONTD: zolpidem (AMBIEN) 10 MG tablet Take 10 mg by mouth at bedtime as needed.         Allergies  Allergen Reactions  . Oxycodone-Acetaminophen Other (See Comments)    Went "out of head"  . Sulfamethoxazole Rash  Past Medical History  Diagnosis Date  . Coronary atherosclerosis of unspecified type of vessel, native or graft     remote cath in 2003 showing distal and branch vessel CAD with normal LV function; managed medically  . Mixed hyperlipidemia   . Glucose intolerance (impaired glucose tolerance)     history of  . Diverticulosis of colon (without mention of hemorrhage)   . Hx of colonic polyps   . Internal hemorrhoids without mention of complication   . External hemorrhoids without mention of complication   . Rash and other nonspecific skin eruption   . Pneumonia, organism unspecified   . Conjunctivitis unspecified   . Nephrolithiasis   . Chronic myeloid leukemia, without mention of having achieved remission   . Myasthenia gravis     uses prednisone  . GERD (gastroesophageal reflux disease)   . Pulmonary embolism June 2013  . DVT (deep venous thrombosis) June 2013  . Chronic anticoagulation   . Diabetes mellitus     Past Surgical History  Procedure Date  . Angioplasty 2001  . Back surgery      x2  . Tonsillectomy     History  Smoking status  . Never Smoker   Smokeless tobacco  . Never Used    History  Alcohol Use No    Family History  Problem Relation Age of Onset  . Heart disease Mother   . Heart attack Mother   . Stroke Mother   . Heart disease Father   . Heart attack Father   . Stroke Father   . Cancer Sister     Breast Cancer-Twin     Review of Systems: The review of systems is positive for easy bruising. No bleeding.  All other systems were reviewed and are negative.  Physical Exam: BP 128/64  Pulse 69  Ht 5\' 8"  (1.727 m)  Wt 179 lb 12.8 oz (81.557 kg)  BMI 27.34 kg/m2 Patient is very pleasant and in no acute distress. Skin is warm and dry. Color is normal.  HEENT is unremarkable. Normocephalic/atraumatic. PERRL. Sclera are nonicteric. Neck is supple. No masses. No JVD. Lungs are clear. Cardiac exam shows a regular rate and rhythm. Abdomen is soft. Extremities are without edema. Gait and ROM are intact. No gross neurologic deficits noted.   LABORATORY DATA:  Myoview Overall Impression:   Normal stress nuclear study.  LV Ejection Fraction: 70%. LV Wall Motion: NL LV Function; NL Wall Motion  [name]  08/26/2011  Lab Results  Component Value Date   WBC 9.8 08/27/2011   HGB 13.0 08/27/2011   HCT 37.8* 08/27/2011   PLT 178 08/27/2011   GLUCOSE 129* 08/27/2011   CHOL 153 08/26/2011   TRIG 139.0 08/26/2011   HDL 46.30 08/26/2011   LDLCALC 79 08/26/2011   ALT 38 08/27/2011   AST 25 08/27/2011   NA 144 08/27/2011   K 4.1 08/27/2011   CL 106 08/27/2011   CREATININE 1.36* 08/27/2011   BUN 18 08/27/2011   CO2 32 08/27/2011   TSH 1.36 08/04/2010   PSA 2.31 08/04/2010   INR 3.9 08/26/2011   HGBA1C 5.9* 07/30/2011    Assessment / Plan:

## 2011-08-28 NOTE — Assessment & Plan Note (Signed)
No angina reported. Myoview looks good. Will let him to start to gradually increase his activity. This may be from deconditioning. Will see him back in September as originally planned. Patient is agreeable to this plan and will call if any problems develop in the interim.

## 2011-08-28 NOTE — Telephone Encounter (Signed)
Left VM for patient that chemistry panel results are stable. Follow up as scheduled.

## 2011-08-28 NOTE — Assessment & Plan Note (Signed)
He remains on his coumadin. No adverse effects noted.

## 2011-08-28 NOTE — Assessment & Plan Note (Signed)
He is about a month out from diagnosis. Will probably require indefinite anticoagulation. No clear cut etiology was identified.

## 2011-09-04 ENCOUNTER — Ambulatory Visit (INDEPENDENT_AMBULATORY_CARE_PROVIDER_SITE_OTHER): Payer: Medicare Other | Admitting: *Deleted

## 2011-09-04 DIAGNOSIS — Z7901 Long term (current) use of anticoagulants: Secondary | ICD-10-CM

## 2011-09-04 DIAGNOSIS — I82409 Acute embolism and thrombosis of unspecified deep veins of unspecified lower extremity: Secondary | ICD-10-CM

## 2011-09-04 DIAGNOSIS — I2699 Other pulmonary embolism without acute cor pulmonale: Secondary | ICD-10-CM

## 2011-09-04 DIAGNOSIS — I824Z9 Acute embolism and thrombosis of unspecified deep veins of unspecified distal lower extremity: Secondary | ICD-10-CM | POA: Diagnosis not present

## 2011-09-06 ENCOUNTER — Encounter (HOSPITAL_COMMUNITY): Payer: Self-pay | Admitting: *Deleted

## 2011-09-06 DIAGNOSIS — Z86711 Personal history of pulmonary embolism: Secondary | ICD-10-CM | POA: Insufficient documentation

## 2011-09-06 DIAGNOSIS — E119 Type 2 diabetes mellitus without complications: Secondary | ICD-10-CM | POA: Insufficient documentation

## 2011-09-06 DIAGNOSIS — K219 Gastro-esophageal reflux disease without esophagitis: Secondary | ICD-10-CM | POA: Insufficient documentation

## 2011-09-06 DIAGNOSIS — Z86718 Personal history of other venous thrombosis and embolism: Secondary | ICD-10-CM | POA: Insufficient documentation

## 2011-09-06 DIAGNOSIS — M79609 Pain in unspecified limb: Secondary | ICD-10-CM | POA: Insufficient documentation

## 2011-09-06 DIAGNOSIS — Z7901 Long term (current) use of anticoagulants: Secondary | ICD-10-CM | POA: Insufficient documentation

## 2011-09-06 DIAGNOSIS — Z79899 Other long term (current) drug therapy: Secondary | ICD-10-CM | POA: Insufficient documentation

## 2011-09-06 DIAGNOSIS — I2510 Atherosclerotic heart disease of native coronary artery without angina pectoris: Secondary | ICD-10-CM | POA: Insufficient documentation

## 2011-09-06 DIAGNOSIS — E782 Mixed hyperlipidemia: Secondary | ICD-10-CM | POA: Insufficient documentation

## 2011-09-06 NOTE — ED Notes (Signed)
Patient with onset of left upper leg pain and he has swelling in his left foot.  Patient reports onset of sx yesterday.  He has hx of dvt in same leg and his sx are same.  He denies sob.  He has hx of pe with last dvt

## 2011-09-07 ENCOUNTER — Emergency Department (HOSPITAL_COMMUNITY)
Admission: EM | Admit: 2011-09-07 | Discharge: 2011-09-07 | Disposition: A | Discharge status: Home / Self Care | Payer: Medicare Other | Attending: Emergency Medicine | Admitting: Emergency Medicine

## 2011-09-07 ENCOUNTER — Telehealth: Payer: Self-pay | Admitting: Cardiology

## 2011-09-07 DIAGNOSIS — Z86711 Personal history of pulmonary embolism: Secondary | ICD-10-CM | POA: Diagnosis not present

## 2011-09-07 DIAGNOSIS — Z86718 Personal history of other venous thrombosis and embolism: Secondary | ICD-10-CM | POA: Diagnosis not present

## 2011-09-07 DIAGNOSIS — Z7901 Long term (current) use of anticoagulants: Secondary | ICD-10-CM | POA: Diagnosis not present

## 2011-09-07 DIAGNOSIS — E782 Mixed hyperlipidemia: Secondary | ICD-10-CM | POA: Diagnosis not present

## 2011-09-07 DIAGNOSIS — I2510 Atherosclerotic heart disease of native coronary artery without angina pectoris: Secondary | ICD-10-CM | POA: Diagnosis not present

## 2011-09-07 DIAGNOSIS — M79606 Pain in leg, unspecified: Secondary | ICD-10-CM

## 2011-09-07 DIAGNOSIS — K219 Gastro-esophageal reflux disease without esophagitis: Secondary | ICD-10-CM | POA: Diagnosis not present

## 2011-09-07 DIAGNOSIS — E119 Type 2 diabetes mellitus without complications: Secondary | ICD-10-CM | POA: Diagnosis not present

## 2011-09-07 DIAGNOSIS — Z79899 Other long term (current) drug therapy: Secondary | ICD-10-CM | POA: Diagnosis not present

## 2011-09-07 DIAGNOSIS — M79609 Pain in unspecified limb: Secondary | ICD-10-CM | POA: Diagnosis not present

## 2011-09-07 NOTE — ED Notes (Signed)
EDP Dr. Bebe Shaggy with pt in triage room, wife present.

## 2011-09-07 NOTE — ED Notes (Signed)
Labs and VS reviewed, orders received and initiated.

## 2011-09-07 NOTE — Telephone Encounter (Signed)
New problem:  Patient wife calling, patient was seen in emergency room at [facility] on yesterday. Will explain to Lauren.

## 2011-09-07 NOTE — Telephone Encounter (Signed)
I spoke with the pt and he said the ER physician had instructed him to make sure Dr Riley Kill knew the pt came into the ER.  The pt had leg pain on Saturday and then he developed worsening left leg pain and swelling in foot after church.  The pt proceeded to the ER for further evaluation.  The ER physician did not have another venous duplex performed because he felt this would still show a clot in leg and the pt's INR was 2.8. Today the pt still has some soreness in his left leg around the clot site, swelling has improved.  The pt is scheduled for INR check on 09/11/11.

## 2011-09-07 NOTE — ED Provider Notes (Addendum)
History     CSN: [id number]  Arrival date & time 09/06/11  1752   First MD Initiated Contact with Patient 09/07/11 0100      Chief Complaint  Patient presents with  . Leg Pain     HPI Leg pain Onset - today Course - improving Worsened by - nothing Improved by - nothing  Pt reports pain/swelling to left LE.  No trauma.  No pain on ambulation No erythema.  He reports it is now improving.  No cp/sob  Past Medical History  Diagnosis Date  . Coronary atherosclerosis of unspecified type of vessel, native or graft     remote cath in 2003 showing distal and branch vessel CAD with normal LV function; managed medically  . Mixed hyperlipidemia   . Glucose intolerance (impaired glucose tolerance)     history of  . Diverticulosis of colon (without mention of hemorrhage)   . Hx of colonic polyps   . Internal hemorrhoids without mention of complication   . External hemorrhoids without mention of complication   . Rash and other nonspecific skin eruption   . Pneumonia, organism unspecified   . Conjunctivitis unspecified   . Nephrolithiasis   . Chronic myeloid leukemia, without mention of having achieved remission   . Myasthenia gravis     uses prednisone  . GERD (gastroesophageal reflux disease)   . Pulmonary embolism June 2013  . DVT (deep venous thrombosis) June 2013  . Chronic anticoagulation   . Diabetes mellitus     Past Surgical History  Procedure Date  . Angioplasty 2001  . Back surgery     x2  . Tonsillectomy     Family History  Problem Relation Age of Onset  . Heart disease Mother   . Heart attack Mother   . Stroke Mother   . Heart disease Father   . Heart attack Father   . Stroke Father   . Cancer Sister     Breast Cancer-Twin     History  Substance Use Topics  . Smoking status: Never Smoker   . Smokeless tobacco: Never Used  . Alcohol Use: No      Review of Systems  Constitutional: Negative for fever.  Respiratory: Negative for shortness of  breath.   Cardiovascular: Negative for chest pain.    Allergies  Oxycodone-acetaminophen and Sulfamethoxazole  Home Medications   Current Outpatient Rx  Name Route Sig Dispense Refill  . ACETAMINOPHEN 325 MG PO TABS Oral Take 650 mg by mouth every 6 (six) hours as needed.    . ASPIRIN 81 MG PO TBEC Oral Take 81 mg by mouth daily.    Marland Kitchen CALCIUM CARBONATE-VITAMIN D 500-200 MG-UNIT PO TABS Oral Take 1 tablet by mouth daily.    Marland Kitchen ESOMEPRAZOLE MAGNESIUM 40 MG PO CPDR Oral Take 40 mg by mouth daily before breakfast.    . HYPROMELLOSE 2.5 % OP SOLN Both Eyes Place 1 drop into both eyes daily as needed. For dry eyes     . IMATINIB MESYLATE 400 MG PO TABS Oral Take 400 mg by mouth daily.      . ISOSORBIDE MONONITRATE ER 30 MG PO TB24 Oral Take 30 mg by mouth daily.    Marland Kitchen LISINOPRIL 5 MG PO TABS Oral Take 1 tablet by mouth Daily.    Marland Kitchen LOPERAMIDE HCL 2 MG PO CAPS Oral Take 2 mg by mouth 4 (four) times daily as needed. For diarrhea    . LORAZEPAM 0.5 MG PO TABS Oral Take 0.5  mg by mouth at bedtime as needed. For sleep.     Marland Kitchen METFORMIN HCL ER 500 MG PO TB24 Oral Take 500 mg by mouth daily with breakfast.    . METOPROLOL SUCCINATE ER 25 MG PO TB24 Oral Take 25 mg by mouth daily.    . OCUVITE PO Oral Take 1 tablet by mouth daily.    Marland Kitchen ONDANSETRON HCL 4 MG PO TABS Oral Take 4 mg by mouth every 6 (six) hours as needed. For nausea.    Marland Kitchen PREDNISONE 10 MG PO TABS Oral Take 10 mg by mouth daily. Take with 2.5mg  tablet for a total of 12.5mg  daily    . PREDNISONE (PAK) 5 MG PO TABS Oral Take 2.5 mg by mouth daily. With the 10mg  tablet for a total of 12.5mg  daily    . PROMETHAZINE HCL 25 MG PO TABS Oral Take 25 mg by mouth daily as needed. For nausea.    Marland Kitchen PYRIDOSTIGMINE BROMIDE 60 MG PO TABS Oral Take 60 mg by mouth daily.     Marland Kitchen ROSUVASTATIN CALCIUM 10 MG PO TABS Oral Take 10 mg by mouth daily.    . WARFARIN SODIUM 5 MG PO TABS Oral Take 2.5-5 mg by mouth daily. Take 2.5mg  on Tues and Fri, take 5mg  all other  days      BP 155/69  Pulse 62  Temp 98 F (36.7 C) (Oral)  Resp 16  Ht 5\' 8"  (1.727 m)  Wt 175 lb (79.379 kg)  BMI 26.61 kg/m2  SpO2 99%  Physical Exam CONSTITUTIONAL: Well developed/well nourished HEAD AND FACE: Normocephalic/atraumatic EYES: EOMI ENMT: Mucous membranes moist NECK: supple no meningeal signs ABDOMEN: soft NEURO: Pt is awake/alert, moves all extremitiesx4 EXTREMITIES: pulses normal, full ROM, no edema, no erythema noted, no calf tenderness SKIN: warm, color normal PSYCH: no abnormalities of mood noted  ED Course  Procedures  Labs Reviewed  PROTIME-INR - Abnormal; Notable for the following:    Prothrombin Time 30.5 (*)     INR 2.87 (*)     All other components within normal limits     1. Leg pain       MDM  Nursing notes including past medical history and social history reviewed and considered in documentation All labs/vitals reviewed and considered Previous records reviewed and considered - recent PE and DVT diagnosis  Pt with recent history of PE/DVT.  Reports he thought he may have another DVT in same leg.  His exam is unremarkable, and his inr is therapeutic.  He has no other symptoms.  Further workup/imaging deferred.  Pt will f/u this week for repeat INR         Joya Gaskins, MD 09/07/11 0131  Joya Gaskins, MD 09/07/11 [phone]

## 2011-09-09 ENCOUNTER — Encounter: Payer: Medicare Other | Admitting: Physician Assistant

## 2011-09-11 ENCOUNTER — Ambulatory Visit (INDEPENDENT_AMBULATORY_CARE_PROVIDER_SITE_OTHER): Payer: Medicare Other

## 2011-09-11 DIAGNOSIS — I82409 Acute embolism and thrombosis of unspecified deep veins of unspecified lower extremity: Secondary | ICD-10-CM

## 2011-09-11 DIAGNOSIS — I824Z9 Acute embolism and thrombosis of unspecified deep veins of unspecified distal lower extremity: Secondary | ICD-10-CM | POA: Diagnosis not present

## 2011-09-11 DIAGNOSIS — I2699 Other pulmonary embolism without acute cor pulmonale: Secondary | ICD-10-CM | POA: Diagnosis not present

## 2011-09-11 DIAGNOSIS — Z7901 Long term (current) use of anticoagulants: Secondary | ICD-10-CM

## 2011-09-11 LAB — POCT INR: INR: 3.9

## 2011-09-18 ENCOUNTER — Ambulatory Visit (INDEPENDENT_AMBULATORY_CARE_PROVIDER_SITE_OTHER): Payer: Medicare Other

## 2011-09-18 DIAGNOSIS — I2699 Other pulmonary embolism without acute cor pulmonale: Secondary | ICD-10-CM | POA: Diagnosis not present

## 2011-09-18 DIAGNOSIS — I82409 Acute embolism and thrombosis of unspecified deep veins of unspecified lower extremity: Secondary | ICD-10-CM

## 2011-09-18 DIAGNOSIS — Z7901 Long term (current) use of anticoagulants: Secondary | ICD-10-CM | POA: Diagnosis not present

## 2011-09-18 DIAGNOSIS — I824Z9 Acute embolism and thrombosis of unspecified deep veins of unspecified distal lower extremity: Secondary | ICD-10-CM | POA: Diagnosis not present

## 2011-09-22 ENCOUNTER — Telehealth: Payer: Self-pay | Admitting: Cardiology

## 2011-09-22 ENCOUNTER — Encounter: Payer: Self-pay | Admitting: Physician Assistant

## 2011-09-22 ENCOUNTER — Telehealth: Payer: Self-pay | Admitting: Gastroenterology

## 2011-09-22 NOTE — Telephone Encounter (Signed)
New msg Pt's wife called and wanted to talk to you only. Please call

## 2011-09-22 NOTE — Telephone Encounter (Signed)
Pt states he is having problems with epigastric pain and difficulty swallowing. Requesting to be seen. Pt scheduled to see Mike Gip PA tomorrow at 1:30pm. Pt aware of appt date and time.

## 2011-09-22 NOTE — Telephone Encounter (Signed)
I spoke with the pt and he is complaining of a constant pressure under his breast bone.  The pt states that this is not a pain and he cannot rate it on a pain scale.  The pt has been having symptoms for a couple of weeks.  The pt does notice that pressure is worse when he eats and he does feel like food gets stuck.  The pt is also having reflux.  I made the pt aware that this sounds GI not cardiac.  At this time the pt will contact Dr Marzetta Board office for further recommendations. Pt agreed with plan.

## 2011-09-23 ENCOUNTER — Ambulatory Visit (INDEPENDENT_AMBULATORY_CARE_PROVIDER_SITE_OTHER): Payer: Medicare Other | Admitting: Physician Assistant

## 2011-09-23 ENCOUNTER — Encounter: Payer: Self-pay | Admitting: Physician Assistant

## 2011-09-23 VITALS — BP 114/60 | HR 68 | Ht 65.75 in | Wt 179.0 lb

## 2011-09-23 DIAGNOSIS — R1310 Dysphagia, unspecified: Secondary | ICD-10-CM

## 2011-09-23 DIAGNOSIS — K219 Gastro-esophageal reflux disease without esophagitis: Secondary | ICD-10-CM | POA: Diagnosis not present

## 2011-09-23 DIAGNOSIS — G7000 Myasthenia gravis without (acute) exacerbation: Secondary | ICD-10-CM | POA: Insufficient documentation

## 2011-09-23 MED ORDER — ESOMEPRAZOLE MAGNESIUM 40 MG PO CPDR: 40.0000 mg | DELAYED_RELEASE_CAPSULE | Freq: Two times a day (BID) | ORAL | Status: DC

## 2011-09-23 NOTE — Progress Notes (Signed)
Subjective:    Patient ID: [name], male    DOB: 1940-10-18, 71 y.o.   MRN: [id number]  HPI [name] is a 70 year old male known to Dr. Arlyce Dice who has multiple medical problems including adult onset diabetes mellitus chronic myeloid leukemia, hypertension, coronary artery disease, myasthenia gravis for which she's been maintained on prednisone and chronic GERD. 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  Patient underwent endoscopy in June of 2011 due to complaints of dysphagia and epigastric pain and was found to have moderate gastritis and a stricture at the GE junction which was [facility] dilated to 18 mm.  He comes in today with complaints of dysphagia over the past couple of months. He says he is also having intermittent episodes of dysphagia regurgitation. He says this usually happens within the first bite or 2 of a meal. He says one day last week he swallowed first bite of his meal which then came right back up and he spit it out on the plate. He has intermittent odynophagia as well. He has been experiencing symptoms with liquids and solids over the past couple of months and not on an everyday basis. He says he has days where he swallows without any difficulty. He also describes a "fullness" in his throat, and a feeling as if he has "something in there". He has had some substernal discomfort as well which does not seem to correlate with meals and has noted increased belching burping heartburn and indigestion. He has been on steroids over the past one year for his myasthenia gravis is supposed to see his neurologist next week, he states when he gets weaned down below 10 mg he usually starts having some increased symptoms with drooping eyelid. He is really not had any other obvious symptoms of his myasthenia.  He says he has had a lot of GI symptoms which she feels are secondary to Gleevec over the past several years with gas, bloating, and intermittent diarrhea  but has not noted any recent changes.   Review of Systems  Constitutional: Negative.   Eyes: Positive for visual disturbance.  Cardiovascular: Negative.   Gastrointestinal: Negative.   Genitourinary: Negative.   Musculoskeletal: Negative.   Skin: Negative.   Neurological: Negative.   Hematological: Negative.   Psychiatric/Behavioral: Negative.    Outpatient Prescriptions Prior to Visit  Medication Sig Dispense Refill  . acetaminophen (TYLENOL) 325 MG tablet Take 650 mg by mouth every 6 (six) hours as needed.      Marland Kitchen aspirin 81 MG EC tablet Take 81 mg by mouth daily.      . calcium-vitamin D (OSCAL WITH D) 500-200 MG-UNIT per tablet Take 1 tablet by mouth daily.      . hydroxypropyl methylcellulose (ISOPTO TEARS) 2.5 % ophthalmic solution Place 1 drop into both eyes daily as needed. For dry eyes       . imatinib (GLEEVEC) 400 MG tablet Take 400 mg by mouth daily.        . isosorbide mononitrate (IMDUR) 30 MG 24 hr tablet Take 30 mg by mouth daily.      Marland Kitchen lisinopril (PRINIVIL,ZESTRIL) 5 MG tablet Take 1 tablet by mouth Daily.      Marland Kitchen loperamide (IMODIUM) 2 MG capsule Take 2 mg by mouth 4 (four) times daily as needed. For diarrhea      . LORazepam (ATIVAN) 0.5 MG tablet Take 0.5 mg by mouth at bedtime as needed. For sleep.       Marland Kitchen  metFORMIN (GLUCOPHAGE-XR) 500 MG 24 hr tablet Take 500 mg by mouth daily with breakfast.      . metoprolol succinate (TOPROL-XL) 25 MG 24 hr tablet Take 25 mg by mouth daily.      . Multiple Vitamins-Minerals (OCUVITE PO) Take 1 tablet by mouth daily.      . ondansetron (ZOFRAN) 4 MG tablet Take 4 mg by mouth every 6 (six) hours as needed. For nausea.      . predniSONE (DELTASONE) 10 MG tablet Take 10 mg by mouth daily. Take with 2.5mg  tablet for a total of 12.5mg  daily      . predniSONE (STERAPRED UNI-PAK) 5 MG TABS Take 2.5 mg by mouth daily. With the 10mg  tablet for a total of 12.5mg  daily      . promethazine (PHENERGAN) 25 MG tablet Take 25 mg by mouth daily  as needed. For nausea.      Marland Kitchen pyridostigmine (MESTINON) 60 MG tablet Take 60 mg by mouth daily.       . rosuvastatin (CRESTOR) 10 MG tablet Take 10 mg by mouth daily.      Marland Kitchen warfarin (COUMADIN) 5 MG tablet Take 2.5-5 mg by mouth daily. Take 2.5mg  on Tues and Fri, take 5mg  all other days      . esomeprazole (NEXIUM) 40 MG capsule Take 40 mg by mouth daily before breakfast.       Allergies  Allergen Reactions  . Oxycodone-Acetaminophen Other (See Comments)    Went "out of head"  . Sulfamethoxazole Rash       Patient Active Problem List  Diagnosis  . LEUKEMIA, MYELOID, CHRONIC  . HYPERLIPIDEMIA, MIXED  . PTOSIS  . HYPERTENSION  . CORONARY ARTERY DISEASE  . HEMORRHOIDS-INTERNAL  . HEMORRHOIDS-EXTERNAL  . PNEUMONIA  . DIVERTICULOSIS-COLON  . Anal or rectal pain  . Early satiety  . RASH-NONVESICULAR  . Diarrhea  . ABDOMINAL PAIN-PERIUMBILICAL  . ABDOMINAL PAIN-EPIGASTRIC  . ABDOMINAL PAIN OTHER SPECIFIED SITE  . PERSONAL HX COLONIC POLYPS  . NEPHROLITHIASIS, HX OF  . THRUSH  . Encounter for PPD test  . Encounter for long-term (current) use of other medications  . Screening for prostate cancer  . Hearing loss  . Type II or unspecified type diabetes mellitus without mention of complication, not stated as uncontrolled  . Acute bronchitis  . Bilateral conjunctivitis  . Subconjunctival hemorrhage  . Pulmonary embolism  . DVT of lower extremity (deep venous thrombosis)  . Hyperglycemia  . Diabetes mellitus  . Encounter for long-term (current) use of anticoagulants  . Myasthenia gravis  . GERD with stricture   History   Social History  . Marital Status: Married    Spouse Name: N/A    Number of Children: 2  . Years of Education: N/A   Occupational History  . retired    Social History Main Topics  . Smoking status: Never Smoker   . Smokeless tobacco: Never Used  . Alcohol Use: No  . Drug Use: No  . Sexually Active: Not Currently   Other Topics Concern  . Not on  file   Social History Narrative  . No narrative on file    Objective:   Physical Exam  Well-developed older white male in no acute distress, pleasant accompanied by his wife. Blood pressure 114/60 pulse 68 height 5 foot 5 weight 179. HEENT; nontraumatic normocephalic EOMI PERRLA sclera anicteric he does have a slight lid droop on the right.neck; Supple no JVD, Cardiovascular; regular rate and rhythm with S1-S2 no  murmur or gallop, Pulmonary; clear bilaterally, Abdomen; soft he has some mild generalized tenderness no guarding no rebound no palpable mass or hepatosplenomegaly bowel sounds are active. Rectal; not done, Extremities; no clubbing cyanosis or edema skin warm and dry, Psych ;mood and affect normal and appropriate       Assessment & Plan:  #32 71 year old male with history of chronic GERD and distal esophageal stricture stricture requiring dilation 2011. Patient now presenting with 2-3 month history of intermittent dysphagia to solids and liquids intermittent substernal discomfort and regurgitation. His symptoms may all be secondary to recurrent stricture, however with liquid dysphagia and concerned that he may have a neuro- muscular component, possibly a manifestation of his myasthenia. He is also currently on chronic Coumadin for a recent DVT/PE making esophageal dilation more complicated. 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 breakfast and a.c. dinner at least in the short-term Schedule for barium swallow with tablet, if this shows a stricture then we can coordinate with the Coumadin clinic for Lovenox bridging etc. so that esophageal dilation can safely be done. Further plans pending results of barium swallow.

## 2011-09-23 NOTE — Patient Instructions (Addendum)
No allergy to soy or eggs.  You have been schedule for a barium swallow with tablet at [facility] at 9:30am on Friday 09/25/2011 Please arrive at 9:15am and report to radiology. If you need to reschedule this appointment please call [phone]  Amy has increased your Nexium to 40 mg. Twice a day, you have been given samples.

## 2011-09-24 NOTE — Progress Notes (Signed)
I agree with imaging, barium swallow.

## 2011-09-25 ENCOUNTER — Ambulatory Visit (HOSPITAL_COMMUNITY)
Admission: RE | Admit: 2011-09-25 | Discharge: 2011-09-25 | Disposition: A | Discharge status: Home / Self Care | Payer: Medicare Other | Source: Ambulatory Visit | Attending: Physician Assistant | Admitting: Physician Assistant

## 2011-09-25 DIAGNOSIS — K228 Other specified diseases of esophagus: Secondary | ICD-10-CM | POA: Diagnosis not present

## 2011-09-25 DIAGNOSIS — K2289 Other specified disease of esophagus: Secondary | ICD-10-CM | POA: Diagnosis not present

## 2011-09-25 DIAGNOSIS — R1310 Dysphagia, unspecified: Secondary | ICD-10-CM | POA: Diagnosis not present

## 2011-09-28 ENCOUNTER — Ambulatory Visit (INDEPENDENT_AMBULATORY_CARE_PROVIDER_SITE_OTHER): Payer: Medicare Other | Admitting: Pharmacist

## 2011-09-28 DIAGNOSIS — I2699 Other pulmonary embolism without acute cor pulmonale: Secondary | ICD-10-CM | POA: Diagnosis not present

## 2011-09-28 DIAGNOSIS — I82409 Acute embolism and thrombosis of unspecified deep veins of unspecified lower extremity: Secondary | ICD-10-CM

## 2011-09-28 DIAGNOSIS — I824Z9 Acute embolism and thrombosis of unspecified deep veins of unspecified distal lower extremity: Secondary | ICD-10-CM | POA: Diagnosis not present

## 2011-09-28 DIAGNOSIS — Z7901 Long term (current) use of anticoagulants: Secondary | ICD-10-CM | POA: Diagnosis not present

## 2011-10-01 DIAGNOSIS — I809 Phlebitis and thrombophlebitis of unspecified site: Secondary | ICD-10-CM | POA: Diagnosis not present

## 2011-10-01 DIAGNOSIS — R259 Unspecified abnormal involuntary movements: Secondary | ICD-10-CM | POA: Diagnosis not present

## 2011-10-01 DIAGNOSIS — E119 Type 2 diabetes mellitus without complications: Secondary | ICD-10-CM | POA: Diagnosis not present

## 2011-10-01 DIAGNOSIS — G7001 Myasthenia gravis with (acute) exacerbation: Secondary | ICD-10-CM | POA: Diagnosis not present

## 2011-10-09 ENCOUNTER — Ambulatory Visit (INDEPENDENT_AMBULATORY_CARE_PROVIDER_SITE_OTHER): Payer: Medicare Other | Admitting: Pharmacist

## 2011-10-09 ENCOUNTER — Encounter: Payer: Self-pay | Admitting: Endocrinology

## 2011-10-09 ENCOUNTER — Ambulatory Visit (INDEPENDENT_AMBULATORY_CARE_PROVIDER_SITE_OTHER): Payer: Medicare Other | Admitting: Endocrinology

## 2011-10-09 VITALS — BP 112/68 | HR 67 | Temp 97.9°F | Ht 68.0 in | Wt 189.0 lb

## 2011-10-09 DIAGNOSIS — I2699 Other pulmonary embolism without acute cor pulmonale: Secondary | ICD-10-CM | POA: Diagnosis not present

## 2011-10-09 DIAGNOSIS — I82409 Acute embolism and thrombosis of unspecified deep veins of unspecified lower extremity: Secondary | ICD-10-CM

## 2011-10-09 DIAGNOSIS — R21 Rash and other nonspecific skin eruption: Secondary | ICD-10-CM | POA: Diagnosis not present

## 2011-10-09 DIAGNOSIS — Z7901 Long term (current) use of anticoagulants: Secondary | ICD-10-CM | POA: Diagnosis not present

## 2011-10-09 DIAGNOSIS — I824Z9 Acute embolism and thrombosis of unspecified deep veins of unspecified distal lower extremity: Secondary | ICD-10-CM | POA: Diagnosis not present

## 2011-10-09 LAB — POCT INR: INR: 2.5

## 2011-10-09 MED ORDER — CEPHALEXIN 500 MG PO TABS: 500.0000 mg | ORAL_TABLET | Freq: Four times a day (QID) | ORAL | Status: DC

## 2011-10-09 MED ORDER — CLOTRIMAZOLE-BETAMETHASONE 1-0.05 % EX CREA: TOPICAL_CREAM | CUTANEOUS | Status: DC

## 2011-10-09 NOTE — Progress Notes (Signed)
Subjective:    Patient ID: [name], male    DOB: 07-28-1940, 71 y.o.   MRN: [id number]  HPI Pt states 1 month of slight rash at the inguinal areas bilaterally, and assoc "nodules." Past Medical History  Diagnosis Date  . Coronary atherosclerosis of unspecified type of vessel, native or graft     remote cath in 2003 showing distal and branch vessel CAD with normal LV function; managed medically  . Mixed hyperlipidemia   . Glucose intolerance (impaired glucose tolerance)     history of  . Diverticulosis of colon (without mention of hemorrhage)   . Hx of colonic polyps   . Internal hemorrhoids without mention of complication   . External hemorrhoids without mention of complication   . Rash and other nonspecific skin eruption   . Pneumonia, organism unspecified   . Conjunctivitis unspecified   . Nephrolithiasis   . Myasthenia gravis     uses prednisone  . GERD (gastroesophageal reflux disease)   . Pulmonary embolism June 2013  . DVT (deep venous thrombosis) June 2013  . Chronic anticoagulation   . Diabetes mellitus   . DJD (degenerative joint disease)   . Ptosis   . CML (chronic myeloid leukemia)   . HTN (hypertension)   . IBS (irritable bowel syndrome)   . History of pneumonia     Past Surgical History  Procedure Date  . Angioplasty 2001  . Back surgery     x2, lumbar and cervical  . Tonsillectomy     History   Social History  . Marital Status: Married    Spouse Name: N/A    Number of Children: 2  . Years of Education: N/A   Occupational History  . retired    Social History Main Topics  . Smoking status: Never Smoker   . Smokeless tobacco: Never Used  . Alcohol Use: No  . Drug Use: No  . Sexually Active: Not Currently   Other Topics Concern  . Not on file   Social History Narrative  . No narrative on file    Current Outpatient Prescriptions on File Prior to Visit  Medication Sig Dispense Refill  . acetaminophen (TYLENOL) 325 MG tablet Take 650  mg by mouth every 6 (six) hours as needed.      Marland Kitchen aspirin 81 MG EC tablet Take 81 mg by mouth daily.      . calcium-vitamin D (OSCAL WITH D) 500-200 MG-UNIT per tablet Take 1 tablet by mouth daily.      Marland Kitchen esomeprazole (NEXIUM) 40 MG capsule Take 1 capsule (40 mg total) by mouth 2 (two) times daily.  20 capsule  0  . hydroxypropyl methylcellulose (ISOPTO TEARS) 2.5 % ophthalmic solution Place 1 drop into both eyes daily as needed. For dry eyes       . imatinib (GLEEVEC) 400 MG tablet Take 400 mg by mouth daily.        . isosorbide mononitrate (IMDUR) 30 MG 24 hr tablet Take 30 mg by mouth daily.      Marland Kitchen lisinopril (PRINIVIL,ZESTRIL) 5 MG tablet Take 1 tablet by mouth Daily.      Marland Kitchen loperamide (IMODIUM) 2 MG capsule Take 2 mg by mouth 4 (four) times daily as needed. For diarrhea      . LORazepam (ATIVAN) 0.5 MG tablet Take 0.5 mg by mouth at bedtime as needed. For sleep.       . metFORMIN (GLUCOPHAGE-XR) 500 MG 24 hr tablet Take 500 mg by mouth  daily with breakfast.      . metoprolol succinate (TOPROL-XL) 25 MG 24 hr tablet Take 25 mg by mouth daily.      . Multiple Vitamins-Minerals (OCUVITE PO) Take 1 tablet by mouth daily.      . ondansetron (ZOFRAN) 4 MG tablet Take 4 mg by mouth every 6 (six) hours as needed. For nausea.      . predniSONE (STERAPRED UNI-PAK) 5 MG TABS Take 5 mg by mouth daily.       . promethazine (PHENERGAN) 25 MG tablet Take 25 mg by mouth daily as needed. For nausea.      Marland Kitchen pyridostigmine (MESTINON) 60 MG tablet Take 60 mg by mouth daily.       . rosuvastatin (CRESTOR) 10 MG tablet Take 10 mg by mouth daily.      . traMADol (ULTRAM) 50 MG tablet Take 50 mg by mouth as needed.      . warfarin (COUMADIN) 5 MG tablet Take 2.5-5 mg by mouth daily. Take 2.5mg  on Tues and Fri, take 5mg  all other days      . DISCONTD: zolpidem (AMBIEN) 10 MG tablet Take 10 mg by mouth at bedtime as needed.         Allergies  Allergen Reactions  . Oxycodone-Acetaminophen Other (See Comments)     Went "out of head"  . Sulfamethoxazole Rash    Family History  Problem Relation Age of Onset  . Heart disease Mother   . Heart attack Mother   . Stroke Mother   . Heart disease Father   . Heart attack Father   . Stroke Father   . Breast cancer Sister     Twin   . Heart disease Sister   . Clotting disorder Sister     BP 112/68  Pulse 67  Temp 97.9 F (36.6 C) (Oral)  Ht 5\' 8"  (1.727 m)  Wt 189 lb (85.73 kg)  BMI 28.74 kg/m2  SpO2 95%  Review of Systems Denies fever    Objective:   Physical Exam VITAL SIGNS:  See vs page.   GENERAL: no distress. Inguinal areas: mild red rash Left inguinal area: few 5-10 mm sq nodules.     Assessment & Plan:  sq nodules, new Tines cruris, new

## 2011-10-09 NOTE — Patient Instructions (Addendum)
i have sent 2 prescriptions to your pharmacy: for an antibiotic pill, and for a skin cream. I hope you feel better soon.  If these symptoms are still there is a few weeks, please call back.

## 2011-10-13 DIAGNOSIS — H1010 Acute atopic conjunctivitis, unspecified eye: Secondary | ICD-10-CM | POA: Diagnosis not present

## 2011-10-19 ENCOUNTER — Encounter: Payer: Self-pay | Admitting: Endocrinology

## 2011-10-19 ENCOUNTER — Ambulatory Visit (INDEPENDENT_AMBULATORY_CARE_PROVIDER_SITE_OTHER): Payer: Medicare Other | Admitting: Endocrinology

## 2011-10-19 ENCOUNTER — Telehealth: Payer: Self-pay | Admitting: Endocrinology

## 2011-10-19 VITALS — BP 118/68 | HR 65 | Temp 97.8°F | Ht 68.0 in | Wt 184.0 lb

## 2011-10-19 DIAGNOSIS — J069 Acute upper respiratory infection, unspecified: Secondary | ICD-10-CM

## 2011-10-19 DIAGNOSIS — L821 Other seborrheic keratosis: Secondary | ICD-10-CM | POA: Diagnosis not present

## 2011-10-19 DIAGNOSIS — L570 Actinic keratosis: Secondary | ICD-10-CM | POA: Diagnosis not present

## 2011-10-19 DIAGNOSIS — L578 Other skin changes due to chronic exposure to nonionizing radiation: Secondary | ICD-10-CM | POA: Diagnosis not present

## 2011-10-19 NOTE — Telephone Encounter (Signed)
Caller: Abdel/Patient; Patient Name: [name]; PCP: Romero Belling (Adults only); Best Callback Phone Number: [phone].  Onset 10/15/11. Afebrile.  Pt noticed a knot under right ear that is swollen and very tender to the touch.  Pt taking an ABX now to treat a swollen gland in his groin, which is getting better per pt.  Positive emergent symptoms due to 'Swollen glands and any new symptoms of localized infection' per Swollen Lymph Nodes protocol.  See Provider in 4 hrs. Appt scheduled at 3pm 10/19/11 with Dr. Everardo All.

## 2011-10-19 NOTE — Patient Instructions (Addendum)
Loratadine-d (non-prescription) will help your congestion.  I hope you feel better soon.  If you don't feel better in 2-3 days, please call back.

## 2011-10-19 NOTE — Progress Notes (Signed)
Subjective:    Patient ID: [name], male    DOB: [birth year], 71 y.o.   MRN: [id number]  HPI Pt states few days of moderate right ear pain, and assoc "swollen gland" at the right neck Past Medical History  Diagnosis Date  . Coronary atherosclerosis of unspecified type of vessel, native or graft     remote cath in 2003 showing distal and branch vessel CAD with normal LV function; managed medically  . Mixed hyperlipidemia   . Glucose intolerance (impaired glucose tolerance)     history of  . Diverticulosis of colon (without mention of hemorrhage)   . Hx of colonic polyps   . Internal hemorrhoids without mention of complication   . External hemorrhoids without mention of complication   . Rash and other nonspecific skin eruption   . Pneumonia, organism unspecified   . Conjunctivitis unspecified   . Nephrolithiasis   . Myasthenia gravis     uses prednisone  . GERD (gastroesophageal reflux disease)   . Pulmonary embolism June 2013  . DVT (deep venous thrombosis) June 2013  . Chronic anticoagulation   . Diabetes mellitus   . DJD (degenerative joint disease)   . Ptosis   . CML (chronic myeloid leukemia)   . HTN (hypertension)   . IBS (irritable bowel syndrome)   . History of pneumonia     Past Surgical History  Procedure Date  . Angioplasty 2001  . Back surgery     x2, lumbar and cervical  . Tonsillectomy     History   Social History  . Marital Status: Married    Spouse Name: N/A    Number of Children: 2  . Years of Education: N/A   Occupational History  . retired    Social History Main Topics  . Smoking status: Never Smoker   . Smokeless tobacco: Never Used  . Alcohol Use: No  . Drug Use: No  . Sexually Active: Not Currently   Other Topics Concern  . Not on file   Social History Narrative  . No narrative on file    Current Outpatient Prescriptions on File Prior to Visit  Medication Sig Dispense Refill  . acetaminophen (TYLENOL) 325 MG tablet Take  650 mg by mouth every 6 (six) hours as needed.      Marland Kitchen aspirin 81 MG EC tablet Take 81 mg by mouth daily.      . calcium-vitamin D (OSCAL WITH D) 500-200 MG-UNIT per tablet Take 1 tablet by mouth daily.      . clotrimazole-betamethasone (LOTRISONE) cream 3 times a day, as needed for rash  45 g  1  . esomeprazole (NEXIUM) 40 MG capsule Take 1 capsule (40 mg total) by mouth 2 (two) times daily.  20 capsule  0  . hydroxypropyl methylcellulose (ISOPTO TEARS) 2.5 % ophthalmic solution Place 1 drop into both eyes daily as needed. For dry eyes       . imatinib (GLEEVEC) 400 MG tablet Take 400 mg by mouth daily.        . isosorbide mononitrate (IMDUR) 30 MG 24 hr tablet Take 30 mg by mouth daily.      Marland Kitchen lisinopril (PRINIVIL,ZESTRIL) 5 MG tablet Take 1 tablet by mouth Daily.      Marland Kitchen loperamide (IMODIUM) 2 MG capsule Take 2 mg by mouth 4 (four) times daily as needed. For diarrhea      . LORazepam (ATIVAN) 0.5 MG tablet Take 0.5 mg by mouth at bedtime as needed. For  sleep.       . metFORMIN (GLUCOPHAGE-XR) 500 MG 24 hr tablet Take 500 mg by mouth daily with breakfast.      . metoprolol succinate (TOPROL-XL) 25 MG 24 hr tablet Take 25 mg by mouth daily.      . Multiple Vitamins-Minerals (OCUVITE PO) Take 1 tablet by mouth daily.      . ondansetron (ZOFRAN) 4 MG tablet Take 4 mg by mouth every 6 (six) hours as needed. For nausea.      . predniSONE (STERAPRED UNI-PAK) 5 MG TABS Take 5 mg by mouth daily.       . promethazine (PHENERGAN) 25 MG tablet Take 25 mg by mouth daily as needed. For nausea.      Marland Kitchen pyridostigmine (MESTINON) 60 MG tablet Take 60 mg by mouth daily.       . rosuvastatin (CRESTOR) 10 MG tablet Take 10 mg by mouth daily.      . traMADol (ULTRAM) 50 MG tablet Take 50 mg by mouth as needed.      . warfarin (COUMADIN) 5 MG tablet Take 2.5-5 mg by mouth daily. Take 2.5mg  on Tues and Fri, take 5mg  all other days      . DISCONTD: zolpidem (AMBIEN) 10 MG tablet Take 10 mg by mouth at bedtime as  needed.         Allergies  Allergen Reactions  . Oxycodone-Acetaminophen Other (See Comments)    Went "out of head"  . Sulfamethoxazole Rash    Family History  Problem Relation Age of Onset  . Heart disease Mother   . Heart attack Mother   . Stroke Mother   . Heart disease Father   . Heart attack Father   . Stroke Father   . Breast cancer Sister     Twin   . Heart disease Sister   . Clotting disorder Sister     BP 118/68  Pulse 65  Temp 97.8 F (36.6 C) (Oral)  Ht 5\' 8"  (1.727 m)  Wt 184 lb (83.462 kg)  BMI 27.98 kg/m2  SpO2 97%  Review of Systems Denies fever and nasal congestion    Objective:   Physical Exam VITAL SIGNS:  See vs page GENERAL: no distress head: no deformity eyes: no periorbital swelling, no proptosis external nose and ears are normal mouth: no lesion seen Right tm is not red, but has fluid      Assessment & Plan:  URI, new

## 2011-10-23 DIAGNOSIS — G241 Genetic torsion dystonia: Secondary | ICD-10-CM | POA: Diagnosis not present

## 2011-10-23 DIAGNOSIS — C9211 Chronic myeloid leukemia, BCR/ABL-positive, in remission: Secondary | ICD-10-CM | POA: Diagnosis not present

## 2011-10-23 DIAGNOSIS — I2699 Other pulmonary embolism without acute cor pulmonale: Secondary | ICD-10-CM | POA: Diagnosis not present

## 2011-10-23 DIAGNOSIS — G7000 Myasthenia gravis without (acute) exacerbation: Secondary | ICD-10-CM | POA: Diagnosis not present

## 2011-10-23 DIAGNOSIS — Z79899 Other long term (current) drug therapy: Secondary | ICD-10-CM | POA: Diagnosis not present

## 2011-10-23 DIAGNOSIS — IMO0002 Reserved for concepts with insufficient information to code with codable children: Secondary | ICD-10-CM | POA: Diagnosis not present

## 2011-10-23 DIAGNOSIS — Z885 Allergy status to narcotic agent status: Secondary | ICD-10-CM | POA: Diagnosis not present

## 2011-10-23 DIAGNOSIS — Z882 Allergy status to sulfonamides status: Secondary | ICD-10-CM | POA: Diagnosis not present

## 2011-10-23 DIAGNOSIS — C9210 Chronic myeloid leukemia, BCR/ABL-positive, not having achieved remission: Secondary | ICD-10-CM | POA: Diagnosis not present

## 2011-10-23 DIAGNOSIS — Z7901 Long term (current) use of anticoagulants: Secondary | ICD-10-CM | POA: Diagnosis not present

## 2011-10-23 DIAGNOSIS — Z9889 Other specified postprocedural states: Secondary | ICD-10-CM | POA: Diagnosis not present

## 2011-10-23 DIAGNOSIS — R220 Localized swelling, mass and lump, head: Secondary | ICD-10-CM | POA: Diagnosis not present

## 2011-10-25 DIAGNOSIS — I2699 Other pulmonary embolism without acute cor pulmonale: Secondary | ICD-10-CM | POA: Insufficient documentation

## 2011-10-26 NOTE — Telephone Encounter (Signed)
Appointment scheduled 10/28/2011 at 7:45am.

## 2011-10-26 NOTE — Telephone Encounter (Signed)
Caller: [name]/Patient; Patient Name: [name]; PCP: Romero Belling (Adults only); Best Callback Phone Number: [phone].  Regarding swollen tender gland under right ear.  Onset 10/15/11. Afebrile.  Pt following up with Dr. Everardo All.  The swelling to site has improved some but not significantly per pt. He continues to have tenderness to his cheek, around the gland site,  and behind the ear.  Area is warm to the touch but no redness noted. Right ear feels congested.  Pt stopped the Claritin D due to  difficulty urinating. All emergent symptoms ruled out per Swollen Lymph Nodes protocol with exception to ' Localized tender, swollen glands that persist or are unimproved after 14 days'.  See Provider in 72 hrs.  Transferred caller to the office to scheduled appt.  Home care advice given.

## 2011-10-28 ENCOUNTER — Ambulatory Visit (INDEPENDENT_AMBULATORY_CARE_PROVIDER_SITE_OTHER): Payer: Medicare Other | Admitting: *Deleted

## 2011-10-28 ENCOUNTER — Ambulatory Visit (INDEPENDENT_AMBULATORY_CARE_PROVIDER_SITE_OTHER): Payer: Medicare Other | Admitting: Endocrinology

## 2011-10-28 ENCOUNTER — Ambulatory Visit (INDEPENDENT_AMBULATORY_CARE_PROVIDER_SITE_OTHER): Payer: Medicare Other | Admitting: Cardiology

## 2011-10-28 ENCOUNTER — Encounter: Payer: Self-pay | Admitting: Endocrinology

## 2011-10-28 ENCOUNTER — Encounter: Payer: Self-pay | Admitting: Cardiology

## 2011-10-28 VITALS — BP 128/62 | HR 57 | Ht 68.0 in | Wt 183.0 lb

## 2011-10-28 VITALS — BP 110/70 | HR 61 | Temp 97.1°F | Ht 68.0 in | Wt 183.2 lb

## 2011-10-28 DIAGNOSIS — Z7901 Long term (current) use of anticoagulants: Secondary | ICD-10-CM | POA: Diagnosis not present

## 2011-10-28 DIAGNOSIS — R51 Headache: Secondary | ICD-10-CM

## 2011-10-28 DIAGNOSIS — I2510 Atherosclerotic heart disease of native coronary artery without angina pectoris: Secondary | ICD-10-CM

## 2011-10-28 DIAGNOSIS — I2699 Other pulmonary embolism without acute cor pulmonale: Secondary | ICD-10-CM

## 2011-10-28 DIAGNOSIS — E782 Mixed hyperlipidemia: Secondary | ICD-10-CM | POA: Diagnosis not present

## 2011-10-28 DIAGNOSIS — I824Z9 Acute embolism and thrombosis of unspecified deep veins of unspecified distal lower extremity: Secondary | ICD-10-CM

## 2011-10-28 DIAGNOSIS — I82409 Acute embolism and thrombosis of unspecified deep veins of unspecified lower extremity: Secondary | ICD-10-CM

## 2011-10-28 LAB — POCT INR: INR: 2.4

## 2011-10-28 NOTE — Patient Instructions (Addendum)
Refer to an ear-nose-throat specialist.  you will receive a phone call, about a day and time for an appointment. 

## 2011-10-28 NOTE — Assessment & Plan Note (Signed)
No recurrent chest pain.  Doing well.  Follow up two months.

## 2011-10-28 NOTE — Assessment & Plan Note (Signed)
See hospital findings.

## 2011-10-28 NOTE — Assessment & Plan Note (Signed)
Tolerating warfarin well.  Apparently has labs done at the cancer center.

## 2011-10-28 NOTE — Assessment & Plan Note (Signed)
Near target.  

## 2011-10-28 NOTE — Progress Notes (Signed)
Subjective:    Patient ID: [name], male    DOB: 05/12/1940, 71 y.o.   MRN: [id number]  HPI Pt states 2 1/2 weeks of slight pain at the right jaw angle, and assoc swelling.  Over this time, sxs are slightly less now.  No help with keflex.  claritin-d helped, but he had to stop it due to sensation of urinary retention.   Past Medical History  Diagnosis Date  . Coronary atherosclerosis of unspecified type of vessel, native or graft     remote cath in 2003 showing distal and branch vessel CAD with normal LV function; managed medically  . Mixed hyperlipidemia   . Glucose intolerance (impaired glucose tolerance)     history of  . Diverticulosis of colon (without mention of hemorrhage)   . Hx of colonic polyps   . Internal hemorrhoids without mention of complication   . External hemorrhoids without mention of complication   . Rash and other nonspecific skin eruption   . Pneumonia, organism unspecified   . Conjunctivitis unspecified   . Nephrolithiasis   . Myasthenia gravis     uses prednisone  . GERD (gastroesophageal reflux disease)   . Pulmonary embolism June 2013  . DVT (deep venous thrombosis) June 2013  . Chronic anticoagulation   . Diabetes mellitus   . DJD (degenerative joint disease)   . Ptosis   . CML (chronic myeloid leukemia)   . HTN (hypertension)   . IBS (irritable bowel syndrome)   . History of pneumonia     Past Surgical History  Procedure Date  . Angioplasty 2001  . Back surgery     x2, lumbar and cervical  . Tonsillectomy     History   Social History  . Marital Status: Married    Spouse Name: N/A    Number of Children: 2  . Years of Education: N/A   Occupational History  . retired    Social History Main Topics  . Smoking status: Never Smoker   . Smokeless tobacco: Never Used  . Alcohol Use: No  . Drug Use: No  . Sexually Active: Not Currently   Other Topics Concern  . Not on file   Social History Narrative  . No narrative on file     Current Outpatient Prescriptions on File Prior to Visit  Medication Sig Dispense Refill  . acetaminophen (TYLENOL) 325 MG tablet Take 650 mg by mouth every 6 (six) hours as needed.      Marland Kitchen aspirin 81 MG EC tablet Take 81 mg by mouth daily.      . calcium-vitamin D (OSCAL WITH D) 500-200 MG-UNIT per tablet Take 1 tablet by mouth daily.      . hydroxypropyl methylcellulose (ISOPTO TEARS) 2.5 % ophthalmic solution Place 1 drop into both eyes daily as needed. For dry eyes       . imatinib (GLEEVEC) 400 MG tablet Take 400 mg by mouth daily.        . isosorbide mononitrate (IMDUR) 30 MG 24 hr tablet Take 30 mg by mouth daily.      Marland Kitchen lisinopril (PRINIVIL,ZESTRIL) 5 MG tablet Take 1 tablet by mouth Daily.      Marland Kitchen loperamide (IMODIUM) 2 MG capsule Take 2 mg by mouth 4 (four) times daily as needed. For diarrhea      . LORazepam (ATIVAN) 0.5 MG tablet Take 0.5 mg by mouth at bedtime as needed. For sleep.       . metFORMIN (GLUCOPHAGE-XR) 500 MG  24 hr tablet Take 500 mg by mouth daily with breakfast.      . metoprolol succinate (TOPROL-XL) 25 MG 24 hr tablet Take 25 mg by mouth daily.      . Multiple Vitamins-Minerals (OCUVITE PO) Take 1 tablet by mouth daily.      . ondansetron (ZOFRAN) 4 MG tablet Take 4 mg by mouth every 6 (six) hours as needed. For nausea.      . predniSONE (STERAPRED UNI-PAK) 5 MG TABS Take 5 mg by mouth daily.       . promethazine (PHENERGAN) 25 MG tablet Take 25 mg by mouth daily as needed. For nausea.      Marland Kitchen pyridostigmine (MESTINON) 60 MG tablet Take 60 mg by mouth daily.       . rosuvastatin (CRESTOR) 10 MG tablet Take 10 mg by mouth daily.      . traMADol (ULTRAM) 50 MG tablet Take 50 mg by mouth as needed.      . warfarin (COUMADIN) 5 MG tablet Take 2.5-5 mg by mouth daily. Take 2.5mg  on Tues and Fri, take 5mg  all other days      . esomeprazole (NEXIUM) 40 MG capsule Take 40 mg by mouth daily before breakfast.      . DISCONTD: zolpidem (AMBIEN) 10 MG tablet Take 10 mg by  mouth at bedtime as needed.         Allergies  Allergen Reactions  . Oxycodone-Acetaminophen Other (See Comments)    Went "out of head"  . Sulfamethoxazole Rash    Family History  Problem Relation Age of Onset  . Heart disease Mother   . Heart attack Mother   . Stroke Mother   . Heart disease Father   . Heart attack Father   . Stroke Father   . Breast cancer Sister     Twin   . Heart disease Sister   . Clotting disorder Sister     BP 110/70  Pulse 61  Temp 97.1 F (36.2 C) (Oral)  Ht 5\' 8"  (1.727 m)  Wt 183 lb 4 oz (83.122 kg)  BMI 27.86 kg/m2  SpO2 96%  Review of Systems Denies fever and dental pain.      Objective:   Physical Exam VITAL SIGNS:  See vs page GENERAL: no distress head: no deformity eyes: no periorbital swelling, no proptosis external nose and ears are normal mouth: no lesion seen Right jaw angle: slight swelling and tenderness Both eac's and tm's are normal     Assessment & Plan:  Facial pain, persistent, uncertain etiology

## 2011-10-28 NOTE — Patient Instructions (Addendum)
Please mail me a copy of your recent lab work from [facility] (CBC)  Your physician recommends that you schedule a follow-up appointment in: 2 MONTHS  Your physician recommends that you continue on your current medications as directed. Please refer to the Current Medication list given to you today.

## 2011-10-28 NOTE — Progress Notes (Signed)
HPI: The patient is seen today in followup. From a cardiac standpoint he is doing well. He was admitted to the hospital and had a pulmonary embolus. He has a DVT. He is on chronic anticoagulation therapy, has been seen back in hematology clinic. Etiology of this was unclear. He was sitting with his sister while she was sick for approximately 3 months, and this was new from before. Previous to this, he did very active. Whether this is the cause is unclear. The patient is doing extremely well at the present time. He did have one nosebleed, but that is about it.    Current Outpatient Prescriptions  Medication Sig Dispense Refill  . acetaminophen (TYLENOL) 325 MG tablet Take 650 mg by mouth every 6 (six) hours as needed.      Marland Kitchen aspirin 81 MG EC tablet Take 81 mg by mouth daily.      . calcium-vitamin D (OSCAL WITH D) 500-200 MG-UNIT per tablet Take 1 tablet by mouth daily.      Marland Kitchen esomeprazole (NEXIUM) 40 MG capsule Take 40 mg by mouth daily before breakfast.      . hydroxypropyl methylcellulose (ISOPTO TEARS) 2.5 % ophthalmic solution Place 1 drop into both eyes daily as needed. For dry eyes       . imatinib (GLEEVEC) 400 MG tablet Take 400 mg by mouth daily.        . isosorbide mononitrate (IMDUR) 30 MG 24 hr tablet Take 30 mg by mouth daily.      Marland Kitchen lisinopril (PRINIVIL,ZESTRIL) 5 MG tablet Take 1 tablet by mouth Daily.      Marland Kitchen loperamide (IMODIUM) 2 MG capsule Take 2 mg by mouth 4 (four) times daily as needed. For diarrhea      . LORazepam (ATIVAN) 0.5 MG tablet Take 0.5 mg by mouth at bedtime as needed. For sleep.       . metFORMIN (GLUCOPHAGE-XR) 500 MG 24 hr tablet Take 500 mg by mouth daily with breakfast.      . metoprolol succinate (TOPROL-XL) 25 MG 24 hr tablet Take 25 mg by mouth daily.      . Multiple Vitamins-Minerals (OCUVITE PO) Take 1 tablet by mouth daily.      . ondansetron (ZOFRAN) 4 MG tablet Take 4 mg by mouth every 6 (six) hours as needed. For nausea.      . predniSONE  (STERAPRED UNI-PAK) 5 MG TABS Take 5 mg by mouth daily.       . promethazine (PHENERGAN) 25 MG tablet Take 25 mg by mouth daily as needed. For nausea.      Marland Kitchen pyridostigmine (MESTINON) 60 MG tablet Take 60 mg by mouth daily.       . rosuvastatin (CRESTOR) 10 MG tablet Take 10 mg by mouth daily.      . traMADol (ULTRAM) 50 MG tablet Take 50 mg by mouth as needed.      . warfarin (COUMADIN) 5 MG tablet Take 2.5-5 mg by mouth daily. Take 2.5mg  on Tues and Fri, take 5mg  all other days      . DISCONTD: zolpidem (AMBIEN) 10 MG tablet Take 10 mg by mouth at bedtime as needed.         Allergies  Allergen Reactions  . Oxycodone-Acetaminophen Other (See Comments)    Went "out of head"  . Sulfamethoxazole Rash    Past Medical History  Diagnosis Date  . Coronary atherosclerosis of unspecified type of vessel, native or graft     remote cath  in 2003 showing distal and branch vessel CAD with normal LV function; managed medically  . Mixed hyperlipidemia   . Glucose intolerance (impaired glucose tolerance)     history of  . Diverticulosis of colon (without mention of hemorrhage)   . Hx of colonic polyps   . Internal hemorrhoids without mention of complication   . External hemorrhoids without mention of complication   . Rash and other nonspecific skin eruption   . Pneumonia, organism unspecified   . Conjunctivitis unspecified   . Nephrolithiasis   . Myasthenia gravis     uses prednisone  . GERD (gastroesophageal reflux disease)   . Pulmonary embolism June 2013  . DVT (deep venous thrombosis) June 2013  . Chronic anticoagulation   . Diabetes mellitus   . DJD (degenerative joint disease)   . Ptosis   . CML (chronic myeloid leukemia)   . HTN (hypertension)   . IBS (irritable bowel syndrome)   . History of pneumonia     Past Surgical History  Procedure Date  . Angioplasty 2001  . Back surgery     x2, lumbar and cervical  . Tonsillectomy     Family History  Problem Relation Age of  Onset  . Heart disease Mother   . Heart attack Mother   . Stroke Mother   . Heart disease Father   . Heart attack Father   . Stroke Father   . Breast cancer Sister     Twin   . Heart disease Sister   . Clotting disorder Sister     History   Social History  . Marital Status: Married    Spouse Name: N/A    Number of Children: 2  . Years of Education: N/A   Occupational History  . retired    Social History Main Topics  . Smoking status: Never Smoker   . Smokeless tobacco: Never Used  . Alcohol Use: No  . Drug Use: No  . Sexually Active: Not Currently   Other Topics Concern  . Not on file   Social History Narrative  . No narrative on file    ROS: Please see the HPI.  All other systems reviewed and negative.  PHYSICAL EXAM:  BP 128/62  Pulse 57  Ht 5\' 8"  (1.727 m)  Wt 183 lb (83.008 kg)  BMI 27.82 kg/m2  SpO2 99%  General: Well developed, well nourished, in no acute distress. Head:  Normocephalic and atraumatic. Neck: no JVD Lungs: Clear to auscultation and percussion. Heart: Normal S1 and S2.  No murmur, rubs or gallops.  Pulses: Pulses normal in all 4 extremities. Extremities: No clubbing or cyanosis. No edema. Neurologic: Alert and oriented x 3. No edema  EKG:  ASSESSMENT AND PLAN:

## 2011-10-30 DIAGNOSIS — H9209 Otalgia, unspecified ear: Secondary | ICD-10-CM | POA: Diagnosis not present

## 2011-10-30 DIAGNOSIS — R6884 Jaw pain: Secondary | ICD-10-CM | POA: Diagnosis not present

## 2011-10-30 DIAGNOSIS — R51 Headache: Secondary | ICD-10-CM | POA: Diagnosis not present

## 2011-10-30 DIAGNOSIS — G8929 Other chronic pain: Secondary | ICD-10-CM | POA: Diagnosis not present

## 2011-11-02 NOTE — Telephone Encounter (Signed)
Patient has been seen in follow up.  See record.  TS

## 2011-11-03 ENCOUNTER — Other Ambulatory Visit: Payer: Self-pay | Admitting: Cardiology

## 2011-11-03 NOTE — Telephone Encounter (Signed)
Please refill.

## 2011-11-05 ENCOUNTER — Other Ambulatory Visit: Payer: Self-pay | Admitting: Otolaryngology

## 2011-11-05 DIAGNOSIS — R6884 Jaw pain: Secondary | ICD-10-CM

## 2011-11-05 DIAGNOSIS — H9209 Otalgia, unspecified ear: Secondary | ICD-10-CM

## 2011-11-09 ENCOUNTER — Ambulatory Visit
Admission: RE | Admit: 2011-11-09 | Discharge: 2011-11-09 | Disposition: A | Discharge status: Home / Self Care | Payer: Medicare Other | Source: Ambulatory Visit | Attending: Otolaryngology | Admitting: Otolaryngology

## 2011-11-09 DIAGNOSIS — H9209 Otalgia, unspecified ear: Secondary | ICD-10-CM

## 2011-11-09 DIAGNOSIS — R51 Headache: Secondary | ICD-10-CM | POA: Diagnosis not present

## 2011-11-09 DIAGNOSIS — R6884 Jaw pain: Secondary | ICD-10-CM

## 2011-11-09 DIAGNOSIS — R1310 Dysphagia, unspecified: Secondary | ICD-10-CM | POA: Diagnosis not present

## 2011-11-09 MED ORDER — IOHEXOL 300 MG/ML  SOLN
75.0000 mL | Freq: Once | INTRAMUSCULAR | Status: AC | PRN
  Administered 2011-11-09: 75 mL via INTRAVENOUS

## 2011-11-11 ENCOUNTER — Other Ambulatory Visit: Payer: Self-pay | Admitting: Otolaryngology

## 2011-11-11 DIAGNOSIS — K118 Other diseases of salivary glands: Secondary | ICD-10-CM

## 2011-11-13 ENCOUNTER — Other Ambulatory Visit: Payer: Self-pay | Admitting: Endocrinology

## 2011-11-13 NOTE — Telephone Encounter (Signed)
Received Request for Glucophage this is historical med, pt last seen on 10/28/11.

## 2011-11-14 DIAGNOSIS — M7020 Olecranon bursitis, unspecified elbow: Secondary | ICD-10-CM | POA: Diagnosis not present

## 2011-11-16 ENCOUNTER — Ambulatory Visit (INDEPENDENT_AMBULATORY_CARE_PROVIDER_SITE_OTHER): Payer: Medicare Other | Admitting: *Deleted

## 2011-11-16 DIAGNOSIS — I2699 Other pulmonary embolism without acute cor pulmonale: Secondary | ICD-10-CM | POA: Diagnosis not present

## 2011-11-16 DIAGNOSIS — I824Z9 Acute embolism and thrombosis of unspecified deep veins of unspecified distal lower extremity: Secondary | ICD-10-CM | POA: Diagnosis not present

## 2011-11-16 DIAGNOSIS — I82409 Acute embolism and thrombosis of unspecified deep veins of unspecified lower extremity: Secondary | ICD-10-CM

## 2011-11-16 DIAGNOSIS — Z7901 Long term (current) use of anticoagulants: Secondary | ICD-10-CM | POA: Diagnosis not present

## 2011-11-16 LAB — POCT INR: INR: 2.5

## 2011-11-18 ENCOUNTER — Ambulatory Visit
Admission: RE | Admit: 2011-11-18 | Discharge: 2011-11-18 | Disposition: A | Discharge status: Home / Self Care | Payer: Medicare Other | Source: Ambulatory Visit | Attending: Otolaryngology | Admitting: Otolaryngology

## 2011-11-18 ENCOUNTER — Other Ambulatory Visit: Payer: Self-pay | Admitting: Otolaryngology

## 2011-11-18 DIAGNOSIS — K118 Other diseases of salivary glands: Secondary | ICD-10-CM

## 2011-11-18 DIAGNOSIS — E0789 Other specified disorders of thyroid: Secondary | ICD-10-CM | POA: Diagnosis not present

## 2011-11-18 MED ORDER — GADOBENATE DIMEGLUMINE 529 MG/ML IV SOLN
17.0000 mL | Freq: Once | INTRAVENOUS | Status: AC | PRN
  Administered 2011-11-18: 17 mL via INTRAVENOUS

## 2011-11-23 ENCOUNTER — Telehealth: Payer: Self-pay | Admitting: *Deleted

## 2011-11-23 DIAGNOSIS — M76899 Other specified enthesopathies of unspecified lower limb, excluding foot: Secondary | ICD-10-CM | POA: Diagnosis not present

## 2011-11-23 NOTE — Telephone Encounter (Signed)
Dr Rosalyn Charters recommendations forward to [facility] in Radiology.

## 2011-11-23 NOTE — Telephone Encounter (Signed)
Message copied by Raul Del on Mon Nov 23, 2011  2:55 PM ------      Message from: Shawnie Pons D      Created: Thu Nov 19, 2011  5:04 PM      Regarding: RE: Needle Bx       He got put on this for PE in the hospital.  We have just followed him up.  I do not know who his provider for the PE is?  They will need to call me specifically and tell me who ordered the parotid biopsy, who the physician is, etc.  That physician will have to call me directly to discuss.              TS      ----- Message -----         From: Raul Del, RN         Sent: 11/19/2011   1:32 PM           To: Sharyn Blitz, RN, Herby Abraham, MD      Subject: Needle Bx                                                Radiology called, [name]), pt will be having a Rt Parotid Mass needle Bx. He needs to be off for 4 days.  Please fax info to [name] at [phone], her # is [phone]. Please let [facility] know as well.

## 2011-12-04 DIAGNOSIS — Z23 Encounter for immunization: Secondary | ICD-10-CM | POA: Diagnosis not present

## 2011-12-07 ENCOUNTER — Ambulatory Visit (INDEPENDENT_AMBULATORY_CARE_PROVIDER_SITE_OTHER): Payer: Medicare Other | Admitting: *Deleted

## 2011-12-07 DIAGNOSIS — Z7901 Long term (current) use of anticoagulants: Secondary | ICD-10-CM

## 2011-12-07 DIAGNOSIS — I824Z9 Acute embolism and thrombosis of unspecified deep veins of unspecified distal lower extremity: Secondary | ICD-10-CM

## 2011-12-07 DIAGNOSIS — I2699 Other pulmonary embolism without acute cor pulmonale: Secondary | ICD-10-CM

## 2011-12-07 DIAGNOSIS — I82409 Acute embolism and thrombosis of unspecified deep veins of unspecified lower extremity: Secondary | ICD-10-CM

## 2011-12-07 LAB — POCT INR: INR: 3.3

## 2011-12-09 DIAGNOSIS — K119 Disease of salivary gland, unspecified: Secondary | ICD-10-CM | POA: Insufficient documentation

## 2011-12-09 DIAGNOSIS — R221 Localized swelling, mass and lump, neck: Secondary | ICD-10-CM | POA: Diagnosis not present

## 2011-12-09 DIAGNOSIS — R220 Localized swelling, mass and lump, head: Secondary | ICD-10-CM | POA: Diagnosis not present

## 2011-12-17 DIAGNOSIS — N400 Enlarged prostate without lower urinary tract symptoms: Secondary | ICD-10-CM | POA: Diagnosis not present

## 2011-12-22 ENCOUNTER — Telehealth: Payer: Self-pay | Admitting: Pharmacist

## 2011-12-22 DIAGNOSIS — M76899 Other specified enthesopathies of unspecified lower limb, excluding foot: Secondary | ICD-10-CM | POA: Diagnosis not present

## 2011-12-22 NOTE — Telephone Encounter (Signed)
Spoke with Rosey Bath with Dr. Greggory Stallion office.  Will fax clearance from Dr. Everardo All to [facility] at [phone].  LM to call us when the procedure is scheduled to arrange appropriate follow up of his INR.

## 2011-12-22 NOTE — Telephone Encounter (Signed)
Message copied by Velda Shell on Tue Dec 22, 2011 11:01 AM ------      Message from: Romero Belling      Created: Tue Dec 22, 2011  9:06 AM       Ok with me to just stop without ridging, thanks            SE                        ----- Message -----         From: Mariane Masters, PHARMD         Sent: 12/21/2011   5:22 PM           To: Romero Belling, MD            Pt needs to have a Rt Parotid Mass biopsy by Dr. Hezzie Bump at [facility].  This was originally scheduled at [facility] but they have referred the patient to a specialist.  Pt had large PE/DVT in June.  They have requested holding his Coumadin for 7 days prior to procedure.  No date has been set yet.  Dr. Riley Kill recommended bridging with Lovenox but suggested your input as well since he is anticoagulated for PE/DVT rather than cardiac issue.  If you could please let us know if pt is 1. Okay to stop Coumadin for 7 days, and if so, 2.  Lovenox bridge or not, that would be great.            Thanks,            Kennon Rounds

## 2011-12-23 DIAGNOSIS — B356 Tinea cruris: Secondary | ICD-10-CM | POA: Diagnosis not present

## 2011-12-23 DIAGNOSIS — N401 Enlarged prostate with lower urinary tract symptoms: Secondary | ICD-10-CM | POA: Diagnosis not present

## 2011-12-29 ENCOUNTER — Encounter (INDEPENDENT_AMBULATORY_CARE_PROVIDER_SITE_OTHER): Payer: Medicare Other

## 2011-12-29 ENCOUNTER — Ambulatory Visit (INDEPENDENT_AMBULATORY_CARE_PROVIDER_SITE_OTHER): Payer: Medicare Other | Admitting: Cardiology

## 2011-12-29 ENCOUNTER — Encounter: Payer: Self-pay | Admitting: Cardiology

## 2011-12-29 VITALS — BP 116/78 | HR 62 | Ht 68.0 in | Wt 182.0 lb

## 2011-12-29 DIAGNOSIS — I2699 Other pulmonary embolism without acute cor pulmonale: Secondary | ICD-10-CM | POA: Diagnosis not present

## 2011-12-29 DIAGNOSIS — I2510 Atherosclerotic heart disease of native coronary artery without angina pectoris: Secondary | ICD-10-CM

## 2011-12-29 DIAGNOSIS — I824Z9 Acute embolism and thrombosis of unspecified deep veins of unspecified distal lower extremity: Secondary | ICD-10-CM | POA: Diagnosis not present

## 2011-12-29 DIAGNOSIS — I82409 Acute embolism and thrombosis of unspecified deep veins of unspecified lower extremity: Secondary | ICD-10-CM

## 2011-12-29 DIAGNOSIS — R0989 Other specified symptoms and signs involving the circulatory and respiratory systems: Secondary | ICD-10-CM

## 2011-12-29 NOTE — Progress Notes (Signed)
HPI:  Patient is been in for followup visit today. He is getting ready to have his parotid biopsy. He was off Coumadin, and also off aspirin. This was arranged with the ENT Department at [facility], and this will be done in the next couple of days.  He is to start on warfarin shortly after.  No chest pain.  Has had a lot of little medical issues as of late.  No swelling in the LE.    Current Outpatient Prescriptions  Medication Sig Dispense Refill  . aspirin 81 MG EC tablet Take 81 mg by mouth daily.      . calcium-vitamin D (OSCAL WITH D) 500-200 MG-UNIT per tablet Take 1 tablet by mouth daily.      Marland Kitchen esomeprazole (NEXIUM) 40 MG capsule Take 40 mg by mouth daily before breakfast.      . ibuprofen (ADVIL,MOTRIN) 800 MG tablet AS NEEDED      . imatinib (GLEEVEC) 400 MG tablet Take 400 mg by mouth daily.        . isosorbide mononitrate (IMDUR) 30 MG 24 hr tablet Take 30 mg by mouth daily.      Marland Kitchen lisinopril (PRINIVIL,ZESTRIL) 5 MG tablet Take 1 tablet by mouth Daily.      Marland Kitchen loperamide (IMODIUM) 2 MG capsule Take 2 mg by mouth 4 (four) times daily as needed. For diarrhea      . LORazepam (ATIVAN) 0.5 MG tablet Take 0.5 mg by mouth at bedtime as needed. For sleep.       . metFORMIN (GLUCOPHAGE-XR) 500 MG 24 hr tablet TAKE 1 TABLET BY MOUTH EVERY DAY IN THE MORNING  90 tablet  2  . metoprolol succinate (TOPROL-XL) 25 MG 24 hr tablet Take 25 mg by mouth daily.      . Multiple Vitamins-Minerals (OCUVITE PO) Take 1 tablet by mouth daily.      . ondansetron (ZOFRAN) 4 MG tablet Take 4 mg by mouth every 6 (six) hours as needed. For nausea.      . predniSONE (STERAPRED UNI-PAK) 5 MG TABS Take 5 mg by mouth daily.       Marland Kitchen pyridostigmine (MESTINON) 60 MG tablet Take 60 mg by mouth daily.       . rosuvastatin (CRESTOR) 10 MG tablet Take 10 mg by mouth daily.      . traMADol (ULTRAM) 50 MG tablet Take 50 mg by mouth as needed.      . warfarin (COUMADIN) 5 MG tablet TAKE AS DIRECTED BY ANTICOAGULATION  CLINIC  30 tablet  3  . [DISCONTINUED] warfarin (COUMADIN) 5 MG tablet Take 2.5-5 mg by mouth daily. Take 2.5mg  on Tues and Fri, take 5mg  all other days      . [DISCONTINUED] zolpidem (AMBIEN) 10 MG tablet Take 10 mg by mouth at bedtime as needed.         Allergies  Allergen Reactions  . Oxycodone-Acetaminophen Other (See Comments)    Went "out of head"  . Sulfamethoxazole Rash    Past Medical History  Diagnosis Date  . Coronary atherosclerosis of unspecified type of vessel, native or graft     remote cath in 2003 showing distal and branch vessel CAD with normal LV function; managed medically  . Mixed hyperlipidemia   . Glucose intolerance (impaired glucose tolerance)     history of  . Diverticulosis of colon (without mention of hemorrhage)   . Hx of colonic polyps   . Internal hemorrhoids without mention of complication   .  External hemorrhoids without mention of complication   . Rash and other nonspecific skin eruption   . Pneumonia, organism unspecified   . Conjunctivitis unspecified   . Nephrolithiasis   . Myasthenia gravis     uses prednisone  . GERD (gastroesophageal reflux disease)   . Pulmonary embolism June 2013  . DVT (deep venous thrombosis) June 2013  . Chronic anticoagulation   . Diabetes mellitus   . DJD (degenerative joint disease)   . Ptosis   . CML (chronic myeloid leukemia)   . HTN (hypertension)   . IBS (irritable bowel syndrome)   . History of pneumonia     Past Surgical History  Procedure Date  . Angioplasty 2001  . Back surgery     x2, lumbar and cervical  . Tonsillectomy     Family History  Problem Relation Age of Onset  . Heart disease Mother   . Heart attack Mother   . Stroke Mother   . Heart disease Father   . Heart attack Father   . Stroke Father   . Breast cancer Sister     Twin   . Heart disease Sister   . Clotting disorder Sister     History   Social History  . Marital Status: Married    Spouse Name: N/A    Number of  Children: 2  . Years of Education: N/A   Occupational History  . retired    Social History Main Topics  . Smoking status: Never Smoker   . Smokeless tobacco: Never Used  . Alcohol Use: No  . Drug Use: No  . Sexually Active: Not Currently   Other Topics Concern  . Not on file   Social History Narrative  . No narrative on file    ROS: Please see the HPI.  All other systems reviewed and negative.  PHYSICAL EXAM:  BP 116/78  Pulse 62  Ht 5\' 8"  (1.727 m)  Wt 182 lb (82.555 kg)  BMI 27.67 kg/m2  SpO2 98%  General: Well developed, well nourished, in no acute distress. Head:  Normocephalic and atraumatic. Neck: no JVD Lungs: Clear to auscultation and percussion. Heart: Normal S1 and S2.  No murmur, rubs or gallops.  Abdomen:  Normal bowel sounds; soft; non tender; no organomegaly Pulses: Pulses normal in all 4 extremities. Extremities: No clubbing or cyanosis. No edema. Neurologic: Alert and oriented x 3.  EKG:  NSR.  WNL.    ASSESSMENT AND PLAN:

## 2011-12-29 NOTE — Patient Instructions (Addendum)
Your physician recommends that you schedule a follow-up appointment in: MARCH 2014  Your physician recommends that you continue on your current medications as directed. Please refer to the Current Medication list given to you today.  

## 2012-01-01 DIAGNOSIS — R220 Localized swelling, mass and lump, head: Secondary | ICD-10-CM | POA: Diagnosis not present

## 2012-01-01 DIAGNOSIS — R221 Localized swelling, mass and lump, neck: Secondary | ICD-10-CM | POA: Diagnosis not present

## 2012-01-01 DIAGNOSIS — K119 Disease of salivary gland, unspecified: Secondary | ICD-10-CM | POA: Diagnosis not present

## 2012-01-08 ENCOUNTER — Ambulatory Visit (INDEPENDENT_AMBULATORY_CARE_PROVIDER_SITE_OTHER): Payer: Medicare Other | Admitting: *Deleted

## 2012-01-08 DIAGNOSIS — I824Z9 Acute embolism and thrombosis of unspecified deep veins of unspecified distal lower extremity: Secondary | ICD-10-CM

## 2012-01-08 DIAGNOSIS — Z7901 Long term (current) use of anticoagulants: Secondary | ICD-10-CM

## 2012-01-08 DIAGNOSIS — I2699 Other pulmonary embolism without acute cor pulmonale: Secondary | ICD-10-CM

## 2012-01-08 DIAGNOSIS — I82409 Acute embolism and thrombosis of unspecified deep veins of unspecified lower extremity: Secondary | ICD-10-CM

## 2012-01-08 LAB — POCT INR: INR: 1.1

## 2012-01-10 NOTE — Assessment & Plan Note (Signed)
He continues to remain stable from a cardiac standpoint.  Continued medical therapy is suggested.

## 2012-01-10 NOTE — Assessment & Plan Note (Signed)
See PE

## 2012-01-10 NOTE — Assessment & Plan Note (Signed)
Patient had an impressive PE at the time of diagnosis, and DVT of a tibial vein.  Most of this was likely associated with his malignancy.  Given the findings, continue therapy for this would likely be recommended.  Regarding an upcoming biopsy, he should likely receive enox leading into the event.

## 2012-01-11 DIAGNOSIS — M25559 Pain in unspecified hip: Secondary | ICD-10-CM | POA: Diagnosis not present

## 2012-01-11 DIAGNOSIS — M76899 Other specified enthesopathies of unspecified lower limb, excluding foot: Secondary | ICD-10-CM | POA: Diagnosis not present

## 2012-01-11 DIAGNOSIS — M6281 Muscle weakness (generalized): Secondary | ICD-10-CM | POA: Diagnosis not present

## 2012-01-13 ENCOUNTER — Other Ambulatory Visit: Payer: Self-pay | Admitting: *Deleted

## 2012-01-13 MED ORDER — METOPROLOL SUCCINATE ER 25 MG PO TB24: 25.0000 mg | ORAL_TABLET | Freq: Every day | ORAL | Status: DC

## 2012-01-13 MED ORDER — ROSUVASTATIN CALCIUM 10 MG PO TABS: 10.0000 mg | ORAL_TABLET | Freq: Every day | ORAL | Status: DC

## 2012-01-15 DIAGNOSIS — M76899 Other specified enthesopathies of unspecified lower limb, excluding foot: Secondary | ICD-10-CM | POA: Diagnosis not present

## 2012-01-15 DIAGNOSIS — M6281 Muscle weakness (generalized): Secondary | ICD-10-CM | POA: Diagnosis not present

## 2012-01-15 DIAGNOSIS — M25559 Pain in unspecified hip: Secondary | ICD-10-CM | POA: Diagnosis not present

## 2012-01-18 ENCOUNTER — Ambulatory Visit (INDEPENDENT_AMBULATORY_CARE_PROVIDER_SITE_OTHER): Payer: Medicare Other | Admitting: *Deleted

## 2012-01-18 ENCOUNTER — Other Ambulatory Visit: Payer: Self-pay | Admitting: *Deleted

## 2012-01-18 DIAGNOSIS — I824Z9 Acute embolism and thrombosis of unspecified deep veins of unspecified distal lower extremity: Secondary | ICD-10-CM | POA: Diagnosis not present

## 2012-01-18 DIAGNOSIS — I82409 Acute embolism and thrombosis of unspecified deep veins of unspecified lower extremity: Secondary | ICD-10-CM

## 2012-01-18 DIAGNOSIS — I2699 Other pulmonary embolism without acute cor pulmonale: Secondary | ICD-10-CM | POA: Diagnosis not present

## 2012-01-18 DIAGNOSIS — Z7901 Long term (current) use of anticoagulants: Secondary | ICD-10-CM | POA: Diagnosis not present

## 2012-01-19 DIAGNOSIS — M25559 Pain in unspecified hip: Secondary | ICD-10-CM | POA: Diagnosis not present

## 2012-01-19 DIAGNOSIS — M76899 Other specified enthesopathies of unspecified lower limb, excluding foot: Secondary | ICD-10-CM | POA: Diagnosis not present

## 2012-01-19 DIAGNOSIS — M6281 Muscle weakness (generalized): Secondary | ICD-10-CM | POA: Diagnosis not present

## 2012-01-20 DIAGNOSIS — M76899 Other specified enthesopathies of unspecified lower limb, excluding foot: Secondary | ICD-10-CM | POA: Diagnosis not present

## 2012-01-20 DIAGNOSIS — M25559 Pain in unspecified hip: Secondary | ICD-10-CM | POA: Diagnosis not present

## 2012-01-20 DIAGNOSIS — M6281 Muscle weakness (generalized): Secondary | ICD-10-CM | POA: Diagnosis not present

## 2012-01-22 DIAGNOSIS — Z882 Allergy status to sulfonamides status: Secondary | ICD-10-CM | POA: Diagnosis not present

## 2012-01-22 DIAGNOSIS — G7000 Myasthenia gravis without (acute) exacerbation: Secondary | ICD-10-CM | POA: Diagnosis not present

## 2012-01-22 DIAGNOSIS — Z7982 Long term (current) use of aspirin: Secondary | ICD-10-CM | POA: Diagnosis not present

## 2012-01-22 DIAGNOSIS — Z885 Allergy status to narcotic agent status: Secondary | ICD-10-CM | POA: Diagnosis not present

## 2012-01-22 DIAGNOSIS — C9211 Chronic myeloid leukemia, BCR/ABL-positive, in remission: Secondary | ICD-10-CM | POA: Diagnosis not present

## 2012-01-22 DIAGNOSIS — R220 Localized swelling, mass and lump, head: Secondary | ICD-10-CM | POA: Diagnosis not present

## 2012-01-22 DIAGNOSIS — Z9889 Other specified postprocedural states: Secondary | ICD-10-CM | POA: Diagnosis not present

## 2012-01-22 DIAGNOSIS — Z79899 Other long term (current) drug therapy: Secondary | ICD-10-CM | POA: Diagnosis not present

## 2012-01-25 DIAGNOSIS — M25559 Pain in unspecified hip: Secondary | ICD-10-CM | POA: Diagnosis not present

## 2012-01-25 DIAGNOSIS — M6281 Muscle weakness (generalized): Secondary | ICD-10-CM | POA: Diagnosis not present

## 2012-01-25 DIAGNOSIS — M76899 Other specified enthesopathies of unspecified lower limb, excluding foot: Secondary | ICD-10-CM | POA: Diagnosis not present

## 2012-01-27 DIAGNOSIS — M76899 Other specified enthesopathies of unspecified lower limb, excluding foot: Secondary | ICD-10-CM | POA: Diagnosis not present

## 2012-01-27 DIAGNOSIS — M6281 Muscle weakness (generalized): Secondary | ICD-10-CM | POA: Diagnosis not present

## 2012-01-27 DIAGNOSIS — M25559 Pain in unspecified hip: Secondary | ICD-10-CM | POA: Diagnosis not present

## 2012-01-28 DIAGNOSIS — G7001 Myasthenia gravis with (acute) exacerbation: Secondary | ICD-10-CM | POA: Diagnosis not present

## 2012-01-28 DIAGNOSIS — M76899 Other specified enthesopathies of unspecified lower limb, excluding foot: Secondary | ICD-10-CM | POA: Diagnosis not present

## 2012-02-02 DIAGNOSIS — M6281 Muscle weakness (generalized): Secondary | ICD-10-CM | POA: Diagnosis not present

## 2012-02-02 DIAGNOSIS — M76899 Other specified enthesopathies of unspecified lower limb, excluding foot: Secondary | ICD-10-CM | POA: Diagnosis not present

## 2012-02-02 DIAGNOSIS — M25559 Pain in unspecified hip: Secondary | ICD-10-CM | POA: Diagnosis not present

## 2012-02-09 ENCOUNTER — Ambulatory Visit (INDEPENDENT_AMBULATORY_CARE_PROVIDER_SITE_OTHER): Payer: Medicare Other | Admitting: *Deleted

## 2012-02-09 DIAGNOSIS — Z7901 Long term (current) use of anticoagulants: Secondary | ICD-10-CM

## 2012-02-09 DIAGNOSIS — I2699 Other pulmonary embolism without acute cor pulmonale: Secondary | ICD-10-CM | POA: Diagnosis not present

## 2012-02-09 DIAGNOSIS — I824Z9 Acute embolism and thrombosis of unspecified deep veins of unspecified distal lower extremity: Secondary | ICD-10-CM | POA: Diagnosis not present

## 2012-02-09 DIAGNOSIS — M76899 Other specified enthesopathies of unspecified lower limb, excluding foot: Secondary | ICD-10-CM | POA: Diagnosis not present

## 2012-02-09 DIAGNOSIS — I82409 Acute embolism and thrombosis of unspecified deep veins of unspecified lower extremity: Secondary | ICD-10-CM

## 2012-02-11 DIAGNOSIS — M25559 Pain in unspecified hip: Secondary | ICD-10-CM | POA: Diagnosis not present

## 2012-02-16 DIAGNOSIS — M76899 Other specified enthesopathies of unspecified lower limb, excluding foot: Secondary | ICD-10-CM | POA: Diagnosis not present

## 2012-02-26 ENCOUNTER — Telehealth: Payer: Self-pay | Admitting: Oncology

## 2012-02-26 ENCOUNTER — Other Ambulatory Visit (HOSPITAL_BASED_OUTPATIENT_CLINIC_OR_DEPARTMENT_OTHER): Payer: Medicare Other

## 2012-02-26 ENCOUNTER — Ambulatory Visit (INDEPENDENT_AMBULATORY_CARE_PROVIDER_SITE_OTHER): Payer: Medicare Other | Admitting: *Deleted

## 2012-02-26 ENCOUNTER — Ambulatory Visit (HOSPITAL_BASED_OUTPATIENT_CLINIC_OR_DEPARTMENT_OTHER): Payer: Medicare Other | Admitting: Oncology

## 2012-02-26 VITALS — BP 125/52 | HR 62 | Temp 97.9°F | Resp 20 | Ht 68.0 in | Wt 182.5 lb

## 2012-02-26 DIAGNOSIS — Z7901 Long term (current) use of anticoagulants: Secondary | ICD-10-CM | POA: Diagnosis not present

## 2012-02-26 DIAGNOSIS — I824Z9 Acute embolism and thrombosis of unspecified deep veins of unspecified distal lower extremity: Secondary | ICD-10-CM

## 2012-02-26 DIAGNOSIS — C9210 Chronic myeloid leukemia, BCR/ABL-positive, not having achieved remission: Secondary | ICD-10-CM

## 2012-02-26 DIAGNOSIS — I2699 Other pulmonary embolism without acute cor pulmonale: Secondary | ICD-10-CM | POA: Diagnosis not present

## 2012-02-26 DIAGNOSIS — R197 Diarrhea, unspecified: Secondary | ICD-10-CM | POA: Diagnosis not present

## 2012-02-26 DIAGNOSIS — I82409 Acute embolism and thrombosis of unspecified deep veins of unspecified lower extremity: Secondary | ICD-10-CM

## 2012-02-26 DIAGNOSIS — R110 Nausea: Secondary | ICD-10-CM | POA: Diagnosis not present

## 2012-02-26 LAB — COMPREHENSIVE METABOLIC PANEL (CC13)
ALT: 37 U/L (ref 0–55)
AST: 26 U/L (ref 5–34)
Albumin: 3.6 g/dL (ref 3.5–5.0)
CO2: 28 mEq/L (ref 22–29)
Calcium: 9.1 mg/dL (ref 8.4–10.4)
Chloride: 105 mEq/L (ref 98–107)
Potassium: 3.4 mEq/L — ABNORMAL LOW (ref 3.5–5.1)
Sodium: 141 mEq/L (ref 136–145)
Total Protein: 6.1 g/dL — ABNORMAL LOW (ref 6.4–8.3)

## 2012-02-26 LAB — CBC & DIFF AND RETIC
Basophils Absolute: 0 10*3/uL (ref 0.0–0.1)
Eosinophils Absolute: 0.1 10*3/uL (ref 0.0–0.5)
Immature Retic Fract: 5 % (ref 3.00–10.60)
MCH: 31.3 pg (ref 27.2–33.4)
MCV: 94.1 fL (ref 79.3–98.0)
NEUT%: 73.3 % (ref 39.0–75.0)
Platelets: 198 10*3/uL (ref 140–400)
RDW: 14.2 % (ref 11.0–14.6)
Retic Ct Abs: 96.79 10*3/uL — ABNORMAL HIGH (ref 34.80–93.90)
WBC: 11.1 10*3/uL — ABNORMAL HIGH (ref 4.0–10.3)
lymph#: 1.4 10*3/uL (ref 0.9–3.3)

## 2012-02-26 LAB — POCT INR: INR: 5

## 2012-02-26 NOTE — Telephone Encounter (Signed)
appts made and printed for pt aom °

## 2012-02-26 NOTE — Progress Notes (Signed)
   [name]    OFFICE PROGRESS NOTE   INTERVAL HISTORY:   He returns as scheduled. He continues Gleevec. He sees [name] every 3 months.  [name] is seeing orthopedics for evaluation of right "hip "discomfort. He reports being diagnosed with bursitis. He has received steroid injections in the right hip.  He continues prednisone for treatment of myasthenia gravis.  He continues to have intermittent diarrhea. He takes Imodium as needed.  Objective:  Vital signs in last 24 hours:  Blood pressure 125/52, pulse 62, temperature 97.9 F (36.6 C), temperature source Oral, resp. rate 20, height 5\' 8"  (1.727 m), weight 182 lb 8 oz (82.781 kg).    HEENT: Mild periorbital edema, mild whitecoat over the tongue, no buccal thrush Resp: Lungs clear bilaterally Cardio: Regular rate and rhythm GI: No hepatosplenomegaly Vascular: Trace low pretibial edema bilaterally   Lab Results:  Lab Results  Component Value Date   WBC 11.1* 02/26/2012   HGB 13.9 02/26/2012   HCT 41.8 02/26/2012   MCV 94.1 02/26/2012   PLT 198 02/26/2012   ANC 8.2    Medications: I have reviewed the patient's current medications.  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 He sees [name] for PCR monitoring. 2. Intermittent nausea and diarrhea, likely related to Gleevec. 3. History of periorbital and extremity edema secondary to Gleevec. 4. Myasthenia gravis-followed by [name] 5. Diabetes 6. History of coronary artery disease 7. Deep vein thrombosis/pulmonary embolism in June 2013-now maintained on Coumadin, managed at the [name] 8. Right hip discomfort-followed by [name]   Disposition:  He remains in remission from the CML. The mild neutrophilia is likely related to steroid use. [name] continues every 3 month followup at [name]. He will return for an office visit here in 8  months.   Tristan Papas, MD  02/26/2012  10:22 AM

## 2012-03-02 ENCOUNTER — Other Ambulatory Visit: Payer: Self-pay | Admitting: *Deleted

## 2012-03-02 MED ORDER — WARFARIN SODIUM 5 MG PO TABS: 5.0000 mg | ORAL_TABLET | ORAL | Status: DC

## 2012-03-03 ENCOUNTER — Telehealth: Payer: Self-pay | Admitting: *Deleted

## 2012-03-03 NOTE — Telephone Encounter (Addendum)
Message from pt requesting a letter from Dr. Truett Perna detailing his diagnosis and treatment. Left message on voicemail informing pt this is in his medical record. Sent request to HIM to send pt a copy of last office note. Request to Dr. Truett Perna for letter.

## 2012-03-07 ENCOUNTER — Ambulatory Visit (INDEPENDENT_AMBULATORY_CARE_PROVIDER_SITE_OTHER): Payer: Medicare Other | Admitting: *Deleted

## 2012-03-07 DIAGNOSIS — Z7901 Long term (current) use of anticoagulants: Secondary | ICD-10-CM | POA: Diagnosis not present

## 2012-03-07 DIAGNOSIS — I2699 Other pulmonary embolism without acute cor pulmonale: Secondary | ICD-10-CM

## 2012-03-07 DIAGNOSIS — I824Z9 Acute embolism and thrombosis of unspecified deep veins of unspecified distal lower extremity: Secondary | ICD-10-CM | POA: Diagnosis not present

## 2012-03-07 DIAGNOSIS — I82409 Acute embolism and thrombosis of unspecified deep veins of unspecified lower extremity: Secondary | ICD-10-CM

## 2012-03-07 LAB — POCT INR: INR: 1.4

## 2012-03-09 ENCOUNTER — Telehealth: Payer: Self-pay | Admitting: *Deleted

## 2012-03-09 NOTE — Telephone Encounter (Signed)
Patient left VM requesting MD to dictate a letter for him to [facility] detailing his diagnosis and treatment. MD notified.

## 2012-03-15 ENCOUNTER — Encounter: Payer: Self-pay | Admitting: Endocrinology

## 2012-03-15 ENCOUNTER — Ambulatory Visit (INDEPENDENT_AMBULATORY_CARE_PROVIDER_SITE_OTHER): Payer: Medicare Other | Admitting: Pharmacist

## 2012-03-15 ENCOUNTER — Ambulatory Visit (INDEPENDENT_AMBULATORY_CARE_PROVIDER_SITE_OTHER): Payer: Medicare Other | Admitting: Endocrinology

## 2012-03-15 VITALS — BP 130/72 | HR 69 | Wt 188.0 lb

## 2012-03-15 DIAGNOSIS — I82409 Acute embolism and thrombosis of unspecified deep veins of unspecified lower extremity: Secondary | ICD-10-CM

## 2012-03-15 DIAGNOSIS — I2699 Other pulmonary embolism without acute cor pulmonale: Secondary | ICD-10-CM

## 2012-03-15 DIAGNOSIS — Z7901 Long term (current) use of anticoagulants: Secondary | ICD-10-CM

## 2012-03-15 DIAGNOSIS — E119 Type 2 diabetes mellitus without complications: Secondary | ICD-10-CM | POA: Diagnosis not present

## 2012-03-15 DIAGNOSIS — I824Z9 Acute embolism and thrombosis of unspecified deep veins of unspecified distal lower extremity: Secondary | ICD-10-CM | POA: Diagnosis not present

## 2012-03-15 DIAGNOSIS — Z0000 Encounter for general adult medical examination without abnormal findings: Secondary | ICD-10-CM

## 2012-03-15 DIAGNOSIS — Z125 Encounter for screening for malignant neoplasm of prostate: Secondary | ICD-10-CM | POA: Diagnosis not present

## 2012-03-15 LAB — TSH: TSH: 0.81 u[IU]/mL (ref 0.35–5.50)

## 2012-03-15 LAB — HEMOGLOBIN A1C: Hgb A1c MFr Bld: 5.7 % (ref 4.6–6.5)

## 2012-03-15 LAB — POCT INR: INR: 3.3

## 2012-03-15 NOTE — Patient Instructions (Addendum)
Please sign release of information for the 2013 MRI. You can take as many as 8 tramadol per day.   Please call if you want to see a "PMR" specialist.   please consider these measures for your health:  minimize alcohol.  do not use tobacco products.  have a colonoscopy at least every 10 years from age 74.  keep firearms safely stored.  always use seat belts.  have working smoke alarms in your home.  see an eye doctor and dentist regularly.  never drive under the influence of alcohol or drugs (including prescription drugs).  those with fair skin should take precautions against the sun. please let me know what your wishes would be, if artificial life support measures should become necessary.  it is critically important to prevent falling down (keep floor areas well-lit, dry, and free of loose objects.  If you have a cane, walker, or wheelchair, you should use it, even for short trips around the house.  Also, try not to rush).   You should have a vaccine against shingles (a painful rash which results from the  chickenpox infection which most people had many years ago).  This vaccine reduces, but does not totally eliminate the risk of shingles.  Because this is a medicare part d benefit, you should get it at a pharmacy.   Please come back for a follow-up appointment in 1 year.

## 2012-03-15 NOTE — Progress Notes (Signed)
Subjective:    Patient ID: [name], male    DOB: 03-24-1940, 72 y.o.   MRN: [id number]  HPI Pt states 5 mos of intermittent severe pain rad from the right hip to the lateral aspect of the right thigh, and assoc numbness. He says he had MRI of the L-spine in 2013.  steroid "pack" (rx'ed by ortho) did not help.   Past Medical History  Diagnosis Date  . Coronary atherosclerosis of unspecified type of vessel, native or graft     remote cath in 2003 showing distal and branch vessel CAD with normal LV function; managed medically  . Mixed hyperlipidemia   . Glucose intolerance (impaired glucose tolerance)     history of  . Diverticulosis of colon (without mention of hemorrhage)   . Hx of colonic polyps   . Internal hemorrhoids without mention of complication   . External hemorrhoids without mention of complication   . Rash and other nonspecific skin eruption   . Pneumonia, organism unspecified   . Conjunctivitis unspecified   . Nephrolithiasis   . Myasthenia gravis     uses prednisone  . GERD (gastroesophageal reflux disease)   . Pulmonary embolism June 2013  . DVT (deep venous thrombosis) June 2013  . Chronic anticoagulation   . Diabetes mellitus   . DJD (degenerative joint disease)   . Ptosis   . CML (chronic myeloid leukemia)   . HTN (hypertension)   . IBS (irritable bowel syndrome)   . History of pneumonia     Past Surgical History  Procedure Date  . Angioplasty 2001  . Back surgery     x2, lumbar and cervical  . Tonsillectomy     History   Social History  . Marital Status: Married    Spouse Name: N/A    Number of Children: 2  . Years of Education: N/A   Occupational History  . retired    Social History Main Topics  . Smoking status: Never Smoker   . Smokeless tobacco: Never Used  . Alcohol Use: No  . Drug Use: No  . Sexually Active: Not Currently   Other Topics Concern  . Not on file   Social History Narrative  . No narrative on file    Current  Outpatient Prescriptions on File Prior to Visit  Medication Sig Dispense Refill  . aspirin 81 MG EC tablet Take 81 mg by mouth daily.      . calcium-vitamin D (OSCAL WITH D) 500-200 MG-UNIT per tablet Take 1 tablet by mouth daily.      Marland Kitchen esomeprazole (NEXIUM) 40 MG capsule Take 40 mg by mouth daily before breakfast.      . ibuprofen (ADVIL,MOTRIN) 800 MG tablet AS NEEDED      . imatinib (GLEEVEC) 400 MG tablet Take 400 mg by mouth daily.        . isosorbide mononitrate (IMDUR) 30 MG 24 hr tablet Take 30 mg by mouth daily.      Marland Kitchen lisinopril (PRINIVIL,ZESTRIL) 5 MG tablet Take 1 tablet by mouth Daily.      Marland Kitchen loperamide (IMODIUM) 2 MG capsule Take 2 mg by mouth 4 (four) times daily as needed. For diarrhea      . LORazepam (ATIVAN) 0.5 MG tablet Take 0.5 mg by mouth at bedtime as needed. For sleep.       . metFORMIN (GLUCOPHAGE-XR) 500 MG 24 hr tablet TAKE 1 TABLET BY MOUTH EVERY DAY IN THE MORNING  90 tablet  2  .  metoprolol succinate (TOPROL-XL) 25 MG 24 hr tablet Take 1 tablet (25 mg total) by mouth daily.  90 tablet  3  . Multiple Vitamins-Minerals (OCUVITE PO) Take 1 tablet by mouth daily.      . ondansetron (ZOFRAN) 4 MG tablet Take 4 mg by mouth every 6 (six) hours as needed. For nausea.      . predniSONE (STERAPRED UNI-PAK) 5 MG TABS Take 5 mg by mouth daily.       Marland Kitchen pyridostigmine (MESTINON) 60 MG tablet Take 60 mg by mouth daily.       . rosuvastatin (CRESTOR) 10 MG tablet Take 1 tablet (10 mg total) by mouth daily.  90 tablet  3  . traMADol (ULTRAM) 50 MG tablet Take 50 mg by mouth every 4 (four) hours as needed. For pain      . warfarin (COUMADIN) 5 MG tablet Take 1 tablet (5 mg total) by mouth as directed.  30 tablet  3  . [DISCONTINUED] zolpidem (AMBIEN) 10 MG tablet Take 10 mg by mouth at bedtime as needed.         Allergies  Allergen Reactions  . Oxycodone-Acetaminophen Other (See Comments)    Went "out of head"  . Sulfamethoxazole Rash    Family History  Problem Relation  Age of Onset  . Heart disease Mother   . Heart attack Mother   . Stroke Mother   . Heart disease Father   . Heart attack Father   . Stroke Father   . Breast cancer Sister     Twin   . Heart disease Sister   . Clotting disorder Sister     BP 130/72  Pulse 69  Wt 188 lb (85.276 kg)  SpO2 97%  Review of Systems Denies bowel or bladder retention.    Objective:   Physical Exam VITAL SIGNS:  See vs page GENERAL: no distress Gait: favors RLE, (pt states due to pain) Pulses: dorsalis pedis intact bilat.   Feet: no deformity.  no ulcer on the feet.  feet are of normal color and temp.  1+ bilat leg edema Neuro: sensation is intact to touch on the feet, but decreased from normal on the right foot.  Lab Results  Component Value Date   WBC 11.1* 02/26/2012   HGB 13.9 02/26/2012   HCT 41.8 02/26/2012   PLT 198 02/26/2012   GLUCOSE 77 02/26/2012   CHOL 153 08/26/2011   TRIG 139.0 08/26/2011   HDL 46.30 08/26/2011   LDLCALC 79 08/26/2011   ALT 37 02/26/2012   AST 26 02/26/2012   NA 141 02/26/2012   K 3.4* 02/26/2012   CL 105 02/26/2012   CREATININE 1.3 02/26/2012   BUN 13.0 02/26/2012   CO2 28 02/26/2012   TSH 0.81 03/15/2012   PSA 2.12 03/15/2012   INR 3.3 03/15/2012   HGBA1C 5.7 03/15/2012      Assessment & Plan:  Pain syndrome, prob radicular.  Pt says he wants to manage this with tramadol for now DM: well-controlled HTN, well-controlled  Subjective:   Patient here for Medicare annual wellness visit and management of other chronic and acute problems.     Risk factors: advanced age.    Roster of Physicians Providing Medical Care to Patient:  See "snapshot"     Activities of Daily Living: In your present state of health, do you have any difficulty performing the following activities?:  Preparing food and eating?: No  Bathing yourself: No  Getting dressed: No  Using the  toilet:No  Moving around from place to place: No  In the past year have you fallen or had a near fall?:No    Home  Safety: Has smoke detector and wears seat belts. Firearms are safely stored. No excess sun exposure.  Diet and Exercise  Current exercise habits: limited by health probs Dietary issues discussed: pt reports a healthy diet   Depression Screen  Q1: Over the past two weeks, have you felt down, depressed or hopeless? no  Q2: Over the past two weeks, have you felt little interest or pleasure in doing things? no   The following portions of the patient's history were reviewed and updated as appropriate: allergies, current medications, past family history, past medical history, past social history, past surgical history and problem list.   Review of Systems  Denies hearing loss, and visual loss Objective:   Vision:  Sees opthalmologist Hearing: grossly normal Body mass index:  See vs page Msk: pt easily and quickly performs "get-up-and-go" from a sitting position Cognitive Impairment Assessment: cognition, memory and judgment appear normal.  remembers 3/3 at 5 minutes.  excellent recall.  can easily read and write a sentence.  alert and oriented x 3.    Assessment:   Medicare wellness utd on preventive parameters    Plan:   During the course of the visit the patient was educated and counseled about appropriate screening and preventive services including:        Fall prevention   Diabetes screening  Nutrition counseling   Vaccines / LABS Zostavax / Pnemonccoal Vaccine  today  PSA  Patient Instructions (the written plan) was given to the patient.

## 2012-03-20 ENCOUNTER — Encounter: Payer: Self-pay | Admitting: Oncology

## 2012-03-21 DIAGNOSIS — IMO0002 Reserved for concepts with insufficient information to code with codable children: Secondary | ICD-10-CM | POA: Diagnosis not present

## 2012-03-21 DIAGNOSIS — M999 Biomechanical lesion, unspecified: Secondary | ICD-10-CM | POA: Diagnosis not present

## 2012-03-22 ENCOUNTER — Telehealth: Payer: Self-pay | Admitting: Endocrinology

## 2012-03-22 NOTE — Telephone Encounter (Signed)
It is unlikely to matter with your type of leukemia, but better to ask your cancer dr first, then next time you go there.

## 2012-03-22 NOTE — Telephone Encounter (Signed)
No, i advised zostavax at recent cpx

## 2012-03-22 NOTE — Telephone Encounter (Signed)
I called the patient.  My Chart had advised the shingles injection (Zostavax). That My Chart e-mail requesting to discuss Zostavax came to me and I had left him a message to call to see if he wanted to schedule.  I called him to day.  He wants [place] to be aware that he can't take live vaccines due to lukemia.

## 2012-03-22 NOTE — Telephone Encounter (Signed)
Pt says he got a message from "Whitney" via MyChart to call and make an appt regarding Zostavax. Pt would like a call back to see what it is that he actually needs. Please call patient back at [phone] / [facility]

## 2012-03-22 NOTE — Telephone Encounter (Signed)
Did you call this patient?

## 2012-03-23 DIAGNOSIS — IMO0002 Reserved for concepts with insufficient information to code with codable children: Secondary | ICD-10-CM | POA: Diagnosis not present

## 2012-03-23 DIAGNOSIS — M999 Biomechanical lesion, unspecified: Secondary | ICD-10-CM | POA: Diagnosis not present

## 2012-03-28 NOTE — Telephone Encounter (Signed)
Pt advised and states an understanding 

## 2012-03-28 NOTE — Telephone Encounter (Signed)
Because pt has leukemia, he should ask his cancer dr if this is ok

## 2012-03-29 ENCOUNTER — Ambulatory Visit (INDEPENDENT_AMBULATORY_CARE_PROVIDER_SITE_OTHER): Payer: Medicare Other | Admitting: *Deleted

## 2012-03-29 DIAGNOSIS — I82409 Acute embolism and thrombosis of unspecified deep veins of unspecified lower extremity: Secondary | ICD-10-CM

## 2012-03-29 DIAGNOSIS — I824Z9 Acute embolism and thrombosis of unspecified deep veins of unspecified distal lower extremity: Secondary | ICD-10-CM

## 2012-03-29 DIAGNOSIS — M999 Biomechanical lesion, unspecified: Secondary | ICD-10-CM | POA: Diagnosis not present

## 2012-03-29 DIAGNOSIS — Z7901 Long term (current) use of anticoagulants: Secondary | ICD-10-CM | POA: Diagnosis not present

## 2012-03-29 DIAGNOSIS — IMO0002 Reserved for concepts with insufficient information to code with codable children: Secondary | ICD-10-CM | POA: Diagnosis not present

## 2012-03-29 DIAGNOSIS — I2699 Other pulmonary embolism without acute cor pulmonale: Secondary | ICD-10-CM

## 2012-03-30 DIAGNOSIS — IMO0002 Reserved for concepts with insufficient information to code with codable children: Secondary | ICD-10-CM | POA: Diagnosis not present

## 2012-03-30 DIAGNOSIS — M999 Biomechanical lesion, unspecified: Secondary | ICD-10-CM | POA: Diagnosis not present

## 2012-03-31 DIAGNOSIS — IMO0002 Reserved for concepts with insufficient information to code with codable children: Secondary | ICD-10-CM | POA: Diagnosis not present

## 2012-03-31 DIAGNOSIS — G7001 Myasthenia gravis with (acute) exacerbation: Secondary | ICD-10-CM | POA: Insufficient documentation

## 2012-04-01 ENCOUNTER — Ambulatory Visit (INDEPENDENT_AMBULATORY_CARE_PROVIDER_SITE_OTHER): Payer: Medicare Other | Admitting: Gastroenterology

## 2012-04-01 ENCOUNTER — Encounter: Payer: Self-pay | Admitting: Gastroenterology

## 2012-04-01 ENCOUNTER — Telehealth: Payer: Self-pay | Admitting: *Deleted

## 2012-04-01 VITALS — BP 118/62 | HR 68 | Ht 65.75 in | Wt 184.2 lb

## 2012-04-01 DIAGNOSIS — K219 Gastro-esophageal reflux disease without esophagitis: Secondary | ICD-10-CM | POA: Diagnosis not present

## 2012-04-01 DIAGNOSIS — R197 Diarrhea, unspecified: Secondary | ICD-10-CM

## 2012-04-01 DIAGNOSIS — R6881 Early satiety: Secondary | ICD-10-CM | POA: Diagnosis not present

## 2012-04-01 NOTE — Assessment & Plan Note (Signed)
Ulcer or nonulcer dyspepsia, gastroparesis are considerations  Recommendations #1 upper endoscopy #2 gastric empty scan #1 is not diagnostic

## 2012-04-01 NOTE — Progress Notes (Signed)
History of Present Illness:  [name] has returned for reevaluation of dysphagia.  He was seen in August, 2013 for dysphagia to solids. This has continued. A barium esophagram did not demonstrate any frank strictures. He complains of ongoing chest discomfort characterized by a fullness and aching in the chest. He feels it could be relieved if he were to belch.  He has immediate postprandial fullness. This has caused early satiety although he has not lost weight. He denies pyrosis. He takes Nexium regularly and ibuprofen very irregularly.  He also complains of diarrhea. At least once a week he has a day where he has multiple watery stools. On good days his stools are poorly formed. There is no history of melena or hematochezia.    Review of Systems: He has chronic low back pain and is scheduled for an MRI Pertinent positive and negative review of systems were noted in the above HPI section. All other review of systems were otherwise negative.    Current Medications, Allergies, Past Medical History, Past Surgical History, Family History and Social History were reviewed in [employer] [facility] electronic medical record  Vital signs were reviewed in today's medical record. Physical Exam: General: Well developed , well nourished, no acute distress Skin: anicteric Head: Normocephalic and atraumatic Eyes:  sclerae anicteric, EOMI Ears: Normal auditory acuity Mouth: No deformity or lesions Lungs: Clear throughout to auscultation Heart: Regular rate and rhythm; no murmurs, rubs or bruits Abdomen: Soft, non tender and slightly distended. No masses, hepatosplenomegaly or hernias noted. Normal Bowel sounds. There is no succussion splash Rectal:deferred Musculoskeletal: Symmetrical with no gross deformities  Pulses:  Normal pulses noted Extremities: No clubbing, cyanosis, edema or deformities noted Neurological: Alert oriented x 4, grossly nonfocal Psychological:  Alert and cooperative. Normal mood and  affect

## 2012-04-01 NOTE — Assessment & Plan Note (Signed)
Intermittent  and nonspecific.  Plan to continue Imodium as needed

## 2012-04-01 NOTE — Patient Instructions (Addendum)
You will be contaced by our office prior to your procedure for directions on holding your Coumadin/Warfarin.  If you do not hear from our office 1 week prior to your scheduled procedure, please call [phone] to discuss.  You have been scheduled for an endoscopy with propofol. Please follow written instructions given to you at your visit today. If you use inhalers (even only as needed) or a CPAP machine, please bring them with you on the day of your procedure.

## 2012-04-01 NOTE — Telephone Encounter (Signed)
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  Dr Everardo All should address the pt holding coumadin prior to endoscopy.

## 2012-04-01 NOTE — Telephone Encounter (Signed)
Stop coumadin 5 days before procedure, and resume right after procedure.

## 2012-04-01 NOTE — Assessment & Plan Note (Signed)
She continues to have dysphagia without an obvious stricture by barium swallow. History is notable for an esophageal stricture.  Recommendations #1 upper endoscopy with dilatation as indicated. Coumadin will be held if approved by his PCP

## 2012-04-01 NOTE — Telephone Encounter (Signed)
Pt's wife advised and states an undestanding

## 2012-04-01 NOTE — Telephone Encounter (Signed)
[facility]  04/01/2012    RE: [name] DOB: July 21, 1940 MRN: [id number]   Dear Dr Tedra Senegal   We have scheduled the above patient for an endoscopic procedure. Our records show that he is on anticoagulation therapy.   Please advise as to how long the patient may come off his therapy of coumadin prior to the procedure, which is scheduled for 04/21/2012.  Please fax back/ or route the completed form to Janmarie Smoot at [phone].   Sincerely,  Merri Ray

## 2012-04-04 ENCOUNTER — Telehealth: Payer: Self-pay | Admitting: Gastroenterology

## 2012-04-04 NOTE — Telephone Encounter (Signed)
Pts wife states that the pt needs something to "help with his digestion." Pt eats breakfast and a little lunch but is so bloated by supper time that he cannot eat. Pt takes Nexium BID. Wife wants to know if Dr. Arlyce Dice can prescribe something for pt that will help his stomach. Please advise.

## 2012-04-04 NOTE — Telephone Encounter (Signed)
He try some simethacone as needed. He is scheduled for upper endoscopy for evaluation of these symptoms.

## 2012-04-04 NOTE — Telephone Encounter (Signed)
Spoke with pts wife and she is aware. 

## 2012-04-06 DIAGNOSIS — M545 Low back pain: Secondary | ICD-10-CM | POA: Diagnosis not present

## 2012-04-06 DIAGNOSIS — IMO0002 Reserved for concepts with insufficient information to code with codable children: Secondary | ICD-10-CM | POA: Diagnosis not present

## 2012-04-06 NOTE — Telephone Encounter (Signed)
Pt aware.

## 2012-04-13 ENCOUNTER — Other Ambulatory Visit: Payer: Self-pay | Admitting: Neurology

## 2012-04-13 ENCOUNTER — Ambulatory Visit (INDEPENDENT_AMBULATORY_CARE_PROVIDER_SITE_OTHER): Payer: Medicare Other | Admitting: *Deleted

## 2012-04-13 DIAGNOSIS — M5416 Radiculopathy, lumbar region: Secondary | ICD-10-CM

## 2012-04-13 DIAGNOSIS — Z7901 Long term (current) use of anticoagulants: Secondary | ICD-10-CM | POA: Diagnosis not present

## 2012-04-13 DIAGNOSIS — I2699 Other pulmonary embolism without acute cor pulmonale: Secondary | ICD-10-CM

## 2012-04-13 DIAGNOSIS — I82409 Acute embolism and thrombosis of unspecified deep veins of unspecified lower extremity: Secondary | ICD-10-CM

## 2012-04-13 DIAGNOSIS — I824Z9 Acute embolism and thrombosis of unspecified deep veins of unspecified distal lower extremity: Secondary | ICD-10-CM

## 2012-04-14 DIAGNOSIS — R220 Localized swelling, mass and lump, head: Secondary | ICD-10-CM | POA: Diagnosis not present

## 2012-04-14 DIAGNOSIS — R221 Localized swelling, mass and lump, neck: Secondary | ICD-10-CM | POA: Diagnosis not present

## 2012-04-21 ENCOUNTER — Encounter: Payer: Self-pay | Admitting: Gastroenterology

## 2012-04-21 ENCOUNTER — Ambulatory Visit (AMBULATORY_SURGERY_CENTER): Payer: Medicare Other | Admitting: Gastroenterology

## 2012-04-21 ENCOUNTER — Other Ambulatory Visit: Payer: Self-pay | Admitting: Gastroenterology

## 2012-04-21 VITALS — BP 121/64 | HR 57 | Temp 97.7°F | Resp 18 | Ht 65.0 in | Wt 184.0 lb

## 2012-04-21 DIAGNOSIS — Z86711 Personal history of pulmonary embolism: Secondary | ICD-10-CM | POA: Diagnosis not present

## 2012-04-21 DIAGNOSIS — C8589 Other specified types of non-Hodgkin lymphoma, extranodal and solid organ sites: Secondary | ICD-10-CM | POA: Diagnosis not present

## 2012-04-21 DIAGNOSIS — R6881 Early satiety: Secondary | ICD-10-CM

## 2012-04-21 DIAGNOSIS — G7000 Myasthenia gravis without (acute) exacerbation: Secondary | ICD-10-CM | POA: Diagnosis not present

## 2012-04-21 DIAGNOSIS — K219 Gastro-esophageal reflux disease without esophagitis: Secondary | ICD-10-CM

## 2012-04-21 DIAGNOSIS — I10 Essential (primary) hypertension: Secondary | ICD-10-CM | POA: Diagnosis not present

## 2012-04-21 DIAGNOSIS — K222 Esophageal obstruction: Secondary | ICD-10-CM

## 2012-04-21 DIAGNOSIS — E119 Type 2 diabetes mellitus without complications: Secondary | ICD-10-CM | POA: Diagnosis not present

## 2012-04-21 DIAGNOSIS — I2510 Atherosclerotic heart disease of native coronary artery without angina pectoris: Secondary | ICD-10-CM | POA: Diagnosis not present

## 2012-04-21 LAB — GLUCOSE, CAPILLARY: Glucose-Capillary: 86 mg/dL (ref 70–99)

## 2012-04-21 MED ORDER — METOCLOPRAMIDE HCL 10 MG PO TABS: 10.0000 mg | ORAL_TABLET | Freq: Four times a day (QID) | ORAL | Status: DC

## 2012-04-21 MED ORDER — SODIUM CHLORIDE 0.9 % IV SOLN: 500.0000 mL | INTRAVENOUS | Status: DC

## 2012-04-21 MED ORDER — DEXTROSE 5 % IV SOLN: INTRAVENOUS | Status: DC

## 2012-04-21 NOTE — Progress Notes (Signed)
Patient did not experience any of the following events: a burn prior to discharge; a fall within the facility; wrong site/side/patient/procedure/implant event; or a hospital transfer or hospital admission upon discharge from the facility. (G8907) Patient did not have preoperative order for IV antibiotic SSI prophylaxis. (G8918)  

## 2012-04-21 NOTE — Patient Instructions (Addendum)
Resume Coumadin today  YOU HAD AN ENDOSCOPIC PROCEDURE TODAY AT THE [facility]: Refer to the procedure report that was given to you for any specific questions about what was found during the examination.  If the procedure report does not answer your questions, please call your gastroenterologist to clarify.  If you requested that your care partner not be given the details of your procedure findings, then the procedure report has been included in a sealed envelope for you to review at your convenience later.  YOU SHOULD EXPECT: Some feelings of bloating in the abdomen. Passage of more gas than usual.  Walking can help get rid of the air that was put into your GI tract during the procedure and reduce the bloating. If you had a lower endoscopy (such as a colonoscopy or flexible sigmoidoscopy) you may notice spotting of blood in your stool or on the toilet paper. If you underwent a bowel prep for your procedure, then you may not have a normal bowel movement for a few days.  DIET: -see dilation diet  ACTIVITY: Your care partner should take you home directly after the procedure.  You should plan to take it easy, moving slowly for the rest of the day.  You can resume normal activity the day after the procedure however you should NOT DRIVE or use heavy machinery for 24 hours (because of the sedation medicines used during the test).    SYMPTOMS TO REPORT IMMEDIATELY: A gastroenterologist can be reached at any hour.  During normal business hours, 8:30 AM to 5:00 PM Monday through Friday, call [phone].  After hours and on weekends, please call the GI answering service at [phone] who will take a message and have the physician on call contact you.   Following upper endoscopy (EGD)  Vomiting of blood or coffee ground material  New chest pain or pain under the shoulder blades  Painful or persistently difficult swallowing  New shortness of breath  Fever of 100F or higher  Black,  tarry-looking stools  FOLLOW UP: If any biopsies were taken you will be contacted by phone or by letter within the next 1-3 weeks.  Call your gastroenterologist if you have not heard about the biopsies in 3 weeks.  Our staff will call the home number listed on your records the next business day following your procedure to check on you and address any questions or concerns that you may have at that time regarding the information given to you following your procedure. This is a courtesy call and so if there is no answer at the home number and we have not heard from you through the emergency physician on call, we will assume that you have returned to your regular daily activities without incident.  SIGNATURES/CONFIDENTIALITY: You and/or your care partner have signed paperwork which will be entered into your electronic medical record.  These signatures attest to the fact that that the information above on your After Visit Summary has been reviewed and is understood.  Full responsibility of the confidentiality of this discharge information lies with you and/or your care-partner.  Dilation diet- handout given  Stricture-handout given  Instruct patient to  contact me immediately  if he develops any side effects from her Reglan including paresthesias, tremors, confusion , weakness or muscle spasms.  Reglan 10 mg four times daily   Follow-up office visit in 3-4 weeks

## 2012-04-21 NOTE — Op Note (Addendum)
[facility] [address] [place], [address]   ENDOSCOPY PROCEDURE REPORT  PATIENT: [name], [name]  MR#: [id number] BIRTHDATE: [birth year] , 71  yrs. old GENDER: Male ENDOSCOPIST: Louis Meckel, MD REFERRED BY: PROCEDURE DATE:  04/21/2012 PROCEDURE:  EGD, diagnostic and Maloney dilation of esophagus ASA CLASS:     Class II INDICATIONS:  Dysphagia. early satiety MEDICATIONS: MAC sedation, administered by CRNA and propofol (Diprivan) 100mg  IV TOPICAL ANESTHETIC:  DESCRIPTION OF PROCEDURE: After the risks benefits and alternatives of the procedure were thoroughly explained, informed consent was obtained.  The LB GIF-H180 [id number] endoscope was introduced through the mouth and advanced to the third portion of the duodenum. Without limitations.  The instrument was slowly withdrawn as the mucosa was fully examined.      At the GE junction there was a early esophageal stricture.  The 9.8 mm gastroscope traversed the stricture with minimal resistance. The remainder of the upper endoscopy exam was otherwise normal. Retroflexed views revealed no abnormalities.     The scope was then withdrawn from the patient.  a #52 [place] dilator was passed with mild resistance. There was no heme and the procedure completed.  COMPLICATIONS: There were no complications. 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

## 2012-04-21 NOTE — Progress Notes (Signed)
Called to room to assist during endoscopic procedure.  Patient ID and intended procedure confirmed with present staff. Received instructions for my participation in the procedure from the performing physician.  

## 2012-04-21 NOTE — Progress Notes (Signed)
Report to pacu rn, vss, bbs=clear 

## 2012-04-22 ENCOUNTER — Other Ambulatory Visit: Payer: Self-pay | Admitting: Neurology

## 2012-04-22 ENCOUNTER — Ambulatory Visit
Admission: RE | Admit: 2012-04-22 | Discharge: 2012-04-22 | Disposition: A | Discharge status: Home / Self Care | Payer: Medicare Other | Source: Ambulatory Visit | Attending: Neurology | Admitting: Neurology

## 2012-04-22 ENCOUNTER — Telehealth: Payer: Self-pay | Admitting: *Deleted

## 2012-04-22 ENCOUNTER — Ambulatory Visit (INDEPENDENT_AMBULATORY_CARE_PROVIDER_SITE_OTHER): Payer: Medicare Other | Admitting: *Deleted

## 2012-04-22 VITALS — BP 130/63 | HR 68

## 2012-04-22 DIAGNOSIS — I824Z9 Acute embolism and thrombosis of unspecified deep veins of unspecified distal lower extremity: Secondary | ICD-10-CM

## 2012-04-22 DIAGNOSIS — Z7901 Long term (current) use of anticoagulants: Secondary | ICD-10-CM

## 2012-04-22 DIAGNOSIS — I82409 Acute embolism and thrombosis of unspecified deep veins of unspecified lower extremity: Secondary | ICD-10-CM

## 2012-04-22 DIAGNOSIS — M5416 Radiculopathy, lumbar region: Secondary | ICD-10-CM

## 2012-04-22 DIAGNOSIS — I2699 Other pulmonary embolism without acute cor pulmonale: Secondary | ICD-10-CM | POA: Diagnosis not present

## 2012-04-22 DIAGNOSIS — M5126 Other intervertebral disc displacement, lumbar region: Secondary | ICD-10-CM | POA: Diagnosis not present

## 2012-04-22 DIAGNOSIS — M47817 Spondylosis without myelopathy or radiculopathy, lumbosacral region: Secondary | ICD-10-CM | POA: Diagnosis not present

## 2012-04-22 MED ORDER — IOHEXOL 180 MG/ML  SOLN
1.0000 mL | Freq: Once | INTRAMUSCULAR | Status: AC | PRN
  Administered 2012-04-22: 1 mL via EPIDURAL

## 2012-04-22 MED ORDER — METHYLPREDNISOLONE ACETATE 40 MG/ML INJ SUSP (RADIOLOG
120.0000 mg | Freq: Once | INTRAMUSCULAR | Status: AC
  Administered 2012-04-22: 120 mg via EPIDURAL

## 2012-04-22 NOTE — Telephone Encounter (Signed)
Left message on number given in admittiing yesterday to call us back if any questions, concerns, or problems. ewm

## 2012-04-26 ENCOUNTER — Other Ambulatory Visit: Payer: Self-pay | Admitting: *Deleted

## 2012-04-26 MED ORDER — LISINOPRIL 5 MG PO TABS: 5.0000 mg | ORAL_TABLET | Freq: Every day | ORAL | Status: DC

## 2012-04-27 ENCOUNTER — Encounter: Payer: Self-pay | Admitting: Cardiology

## 2012-04-27 ENCOUNTER — Ambulatory Visit (INDEPENDENT_AMBULATORY_CARE_PROVIDER_SITE_OTHER): Payer: Medicare Other | Admitting: *Deleted

## 2012-04-27 ENCOUNTER — Ambulatory Visit (INDEPENDENT_AMBULATORY_CARE_PROVIDER_SITE_OTHER): Payer: Medicare Other | Admitting: Cardiology

## 2012-04-27 VITALS — BP 140/70 | HR 62 | Ht 67.0 in | Wt 183.0 lb

## 2012-04-27 DIAGNOSIS — I82409 Acute embolism and thrombosis of unspecified deep veins of unspecified lower extremity: Secondary | ICD-10-CM

## 2012-04-27 DIAGNOSIS — I2699 Other pulmonary embolism without acute cor pulmonale: Secondary | ICD-10-CM | POA: Diagnosis not present

## 2012-04-27 DIAGNOSIS — I2510 Atherosclerotic heart disease of native coronary artery without angina pectoris: Secondary | ICD-10-CM | POA: Diagnosis not present

## 2012-04-27 DIAGNOSIS — I824Z9 Acute embolism and thrombosis of unspecified deep veins of unspecified distal lower extremity: Secondary | ICD-10-CM

## 2012-04-27 DIAGNOSIS — Z7901 Long term (current) use of anticoagulants: Secondary | ICD-10-CM

## 2012-04-27 NOTE — Progress Notes (Signed)
HPI:  This nice gentleman is seen today in a followup visit. The past several months he has had chronic central chest discomfort that radiates straight through to the back. He underwent upper endoscopy recently, and he tells me that he stretched his esophagus. He is been placed on medication to improve esophageal dysmotility. Otherwise, he has been able walk. He's not had diaphoresis, shortness of breath or progressive symptoms otherwise. Howeever, he did become concerned and almost came  to the emergency room the other day.  He does look quite stable.  He had an esophageal stricture at that time.  He has not had other symptoms that are cardiac type symptoms.    Current Outpatient Prescriptions  Medication Sig Dispense Refill  . calcium-vitamin D (OSCAL WITH D) 500-200 MG-UNIT per tablet Take 1 tablet by mouth daily.      Marland Kitchen esomeprazole (NEXIUM) 40 MG capsule Take 40 mg by mouth daily before breakfast.      . ibuprofen (ADVIL,MOTRIN) 800 MG tablet AS NEEDED      . imatinib (GLEEVEC) 400 MG tablet Take 400 mg by mouth daily.        . isosorbide mononitrate (IMDUR) 30 MG 24 hr tablet Take 30 mg by mouth daily.      Marland Kitchen lisinopril (PRINIVIL,ZESTRIL) 5 MG tablet Take 1 tablet (5 mg total) by mouth daily.  90 tablet  3  . loperamide (IMODIUM) 2 MG capsule Take 2 mg by mouth 4 (four) times daily as needed. For diarrhea      . LORazepam (ATIVAN) 0.5 MG tablet Take 0.5 mg by mouth at bedtime as needed. For sleep.       . metFORMIN (GLUCOPHAGE-XR) 500 MG 24 hr tablet TAKE 1 TABLET BY MOUTH EVERY DAY IN THE MORNING  90 tablet  2  . metoCLOPramide (REGLAN) 10 MG tablet Take 1 tablet (10 mg total) by mouth 4 (four) times daily.  60 tablet  5  . metoprolol succinate (TOPROL-XL) 25 MG 24 hr tablet Take 1 tablet (25 mg total) by mouth daily.  90 tablet  3  . Multiple Vitamins-Minerals (OCUVITE PO) Take 1 tablet by mouth daily.      . ondansetron (ZOFRAN) 4 MG tablet Take 4 mg by mouth every 6 (six) hours as  needed. For nausea.      . predniSONE (STERAPRED UNI-PAK) 5 MG TABS Take 5 mg by mouth daily.       Marland Kitchen pyridostigmine (MESTINON) 60 MG tablet Take 60 mg by mouth daily.       . rosuvastatin (CRESTOR) 10 MG tablet Take 1 tablet (10 mg total) by mouth daily.  90 tablet  3  . traMADol (ULTRAM) 50 MG tablet Take 50 mg by mouth every 4 (four) hours as needed. For pain      . warfarin (COUMADIN) 5 MG tablet Take 1 tablet (5 mg total) by mouth as directed.  30 tablet  3  . [DISCONTINUED] zolpidem (AMBIEN) 10 MG tablet Take 10 mg by mouth at bedtime as needed.        No current facility-administered medications for this visit.    Allergies  Allergen Reactions  . Oxycodone-Acetaminophen Other (See Comments)    Went "out of head"  . Sulfamethoxazole Rash    Past Medical History  Diagnosis Date  . Coronary atherosclerosis of unspecified type of vessel, native or graft     remote cath in 2003 showing distal and branch vessel CAD with normal LV function; managed medically  .  Mixed hyperlipidemia   . Glucose intolerance (impaired glucose tolerance)     history of  . Diverticulosis of colon (without mention of hemorrhage)   . Hx of colonic polyps   . Internal hemorrhoids without mention of complication   . External hemorrhoids without mention of complication   . Rash and other nonspecific skin eruption   . Pneumonia, organism unspecified   . Conjunctivitis unspecified   . Nephrolithiasis   . Myasthenia gravis     uses prednisone  . GERD (gastroesophageal reflux disease)   . Pulmonary embolism June 2013  . DVT (deep venous thrombosis) June 2013  . Chronic anticoagulation   . Diabetes mellitus   . DJD (degenerative joint disease)   . Ptosis   . CML (chronic myeloid leukemia)   . HTN (hypertension)   . IBS (irritable bowel syndrome)   . History of pneumonia   . Dystonia     Past Surgical History  Procedure Laterality Date  . Angioplasty  2001  . Back surgery      x2, lumbar and  cervical  . Tonsillectomy    . Cataract extraction Bilateral 12/12    Family History  Problem Relation Age of Onset  . Heart disease Mother   . Heart attack Mother   . Stroke Mother   . Heart disease Father   . Heart attack Father   . Stroke Father   . Breast cancer Sister     Twin   . Heart disease Sister   . Clotting disorder Sister     History   Social History  . Marital Status: Married    Spouse Name: N/A    Number of Children: 2  . Years of Education: N/A   Occupational History  . retired    Social History Main Topics  . Smoking status: Never Smoker   . Smokeless tobacco: Never Used  . Alcohol Use: No  . Drug Use: No  . Sexually Active: Not Currently   Other Topics Concern  . Not on file   Social History Narrative  . No narrative on file    ROS: Please see the HPI.  All other systems reviewed and negative.  PHYSICAL EXAM:  BP 140/70  Pulse 62  Ht 5\' 7"  (1.702 m)  Wt 183 lb (83.008 kg)  BMI 28.66 kg/m2  SpO2 98%  General: Well developed, well nourished, in no acute distress. Head:  Normocephalic and atraumatic. Neck: no JVD Lungs: Clear to auscultation and percussion. Heart: Normal S1 and S2.  No murmur, rubs or gallops.  Pulses: Pulses normal in all 4 extremities. Extremities: No clubbing or cyanosis. No edema. Neurologic: Alert and oriented x 3.  EKG:  NSR.  WNL.    ASSESSMENT AND PLAN:  1.  Atypical pain in a patient with known CAD.  Symptoms occur all the time, no other symptoms, not exertion related.  He is agreeable to a routine GXT.   2.  Esophageal stricture with recent dilatation.   3.  Prior history of PE on warfarin anticoagulation.    Plan routine GXT.

## 2012-04-27 NOTE — Patient Instructions (Addendum)
Your physician has requested that you have an exercise tolerance test. For further information please visit [email or web address]. Please also follow instruction sheet, as given.   

## 2012-04-30 NOTE — Assessment & Plan Note (Signed)
The current symptoms sound atypical  See note.  Would favor proceeding with routine GXT, and he favors this plan as well.  Currently on medical therapy.

## 2012-04-30 NOTE — Assessment & Plan Note (Signed)
Currently on full dose anticoag and not planning to change that with underlying malignancy.

## 2012-05-03 DIAGNOSIS — G7000 Myasthenia gravis without (acute) exacerbation: Secondary | ICD-10-CM | POA: Diagnosis not present

## 2012-05-03 DIAGNOSIS — C9211 Chronic myeloid leukemia, BCR/ABL-positive, in remission: Secondary | ICD-10-CM | POA: Diagnosis not present

## 2012-05-03 DIAGNOSIS — D539 Nutritional anemia, unspecified: Secondary | ICD-10-CM | POA: Diagnosis not present

## 2012-05-03 DIAGNOSIS — K222 Esophageal obstruction: Secondary | ICD-10-CM | POA: Diagnosis not present

## 2012-05-04 ENCOUNTER — Encounter: Payer: Medicare Other | Admitting: Physician Assistant

## 2012-05-04 ENCOUNTER — Ambulatory Visit (INDEPENDENT_AMBULATORY_CARE_PROVIDER_SITE_OTHER): Payer: Medicare Other | Admitting: Physician Assistant

## 2012-05-04 ENCOUNTER — Ambulatory Visit (INDEPENDENT_AMBULATORY_CARE_PROVIDER_SITE_OTHER): Payer: Medicare Other | Admitting: *Deleted

## 2012-05-04 ENCOUNTER — Ambulatory Visit (HOSPITAL_COMMUNITY): Payer: Medicare Other

## 2012-05-04 DIAGNOSIS — R079 Chest pain, unspecified: Secondary | ICD-10-CM | POA: Diagnosis not present

## 2012-05-04 DIAGNOSIS — I82409 Acute embolism and thrombosis of unspecified deep veins of unspecified lower extremity: Secondary | ICD-10-CM

## 2012-05-04 DIAGNOSIS — I2699 Other pulmonary embolism without acute cor pulmonale: Secondary | ICD-10-CM

## 2012-05-04 DIAGNOSIS — I824Z9 Acute embolism and thrombosis of unspecified deep veins of unspecified distal lower extremity: Secondary | ICD-10-CM | POA: Diagnosis not present

## 2012-05-04 DIAGNOSIS — Z7901 Long term (current) use of anticoagulants: Secondary | ICD-10-CM

## 2012-05-04 LAB — POCT INR: INR: 2.5

## 2012-05-04 NOTE — Progress Notes (Signed)
Exercise Treadmill Test  Pre-Exercise Testing Evaluation Rhythm: normal sinus  Rate: 60                 Test  Exercise Tolerance Test Ordering MD: Shawnie Pons, MD  Interpreting MD: Tereso Newcomer, PA-C  Unique Test No: 1  Treadmill:  1  Indication for ETT: chest pain - rule out ischemia  Contraindication to ETT: No   Stress Modality: exercise - treadmill  Cardiac Imaging Performed: non   Protocol: standard Bruce - maximal  Max BP:  184/75  Max MPHR (bpm):  146 85% MPR (bpm):  124  MPHR obtained (bpm):  125 % MPHR obtained:  85%  Reached 85% MPHR (min:sec):  6:00 Total Exercise Time (min-sec):  6:08  Workload in METS:  7.2 Borg Scale: 17  Reason ETT Terminated:  fatigue    ST Segment Analysis At Rest: normal ST segments - no evidence of significant ST depression With Exercise: non-specific ST changes  Other Information Arrhythmia:  No Angina during ETT:  present (1) Quality of ETT:  diagnostic  ETT Interpretation:  normal - no evidence of ischemia by ST analysis  Comments: Fair exercise tolerance. He has constant chest pain.  No change with exercise. Normal BP response to exercise. Non-specific ST-T changes.  No significant changes to suggest ischemia.   Recommendations: Will review ECGs with Dr.  Shawnie Pons. Signed, Tereso Newcomer, PA-C  2:29 PM 05/04/2012

## 2012-05-13 ENCOUNTER — Ambulatory Visit (INDEPENDENT_AMBULATORY_CARE_PROVIDER_SITE_OTHER): Payer: Medicare Other | Admitting: Gastroenterology

## 2012-05-13 ENCOUNTER — Encounter: Payer: Self-pay | Admitting: Gastroenterology

## 2012-05-13 VITALS — BP 124/84 | HR 63 | Ht 67.0 in | Wt 180.8 lb

## 2012-05-13 DIAGNOSIS — R110 Nausea: Secondary | ICD-10-CM | POA: Insufficient documentation

## 2012-05-13 DIAGNOSIS — R197 Diarrhea, unspecified: Secondary | ICD-10-CM | POA: Diagnosis not present

## 2012-05-13 MED ORDER — RIFAXIMIN 200 MG PO TABS: 200.0000 mg | ORAL_TABLET | Freq: Three times a day (TID) | ORAL | Status: DC

## 2012-05-13 NOTE — Progress Notes (Signed)
History of Present Illness:  Mr. Camarena has returned for followup of dysphagia and for diarrhea. Dysphagia has significantly improved since his last dilatation. His main complaints are nausea and frequent diarrhea. Over the past week he's had multiple episodes of stools throughout the day and night. There's been no change in medications. Patient has a history of gastroparesis and diabetes.    Review of Systems: Pertinent positive and negative review of systems were noted in the above HPI section. All other review of systems were otherwise negative.    Current Medications, Allergies, Past Medical History, Past Surgical History, Family History and Social History were reviewed in [employer] electronic medical record  Vital signs were reviewed in today's medical record. Physical Exam: General: Well developed , well nourished, no acute distress

## 2012-05-13 NOTE — Patient Instructions (Addendum)
Your prescription will be at your pharmacy

## 2012-05-13 NOTE — Assessment & Plan Note (Signed)
Nausea may be multi-factorial including medications (Gleevac), gastroparesis  Recommendations #1 trial of Reglan  Pt was instructed to  contact me immediately  if he develops any side effects from her Reglan including paresthesias, tremors, confusion , weakness or muscle spasms.

## 2012-05-13 NOTE — Assessment & Plan Note (Addendum)
He frequently has diarrhea which he has attributed to his medications including Gleevec. He may have bacterial overgrowth related to diabetes and motility issues.  Her conditions #1 trial of xifixan at 200 mg 3 times a day for 5 days

## 2012-05-15 ENCOUNTER — Telehealth: Payer: Self-pay | Admitting: Cardiology

## 2012-05-15 NOTE — Telephone Encounter (Signed)
I called to check on status. He is doing some better.  Not having much discomfort at this point, and it seems to largely have resolved.  Will see back in follow up in two weeks to decide on weather we should repeat CTA of pulmonary arteries, but INR is 2.5 on warfarin.

## 2012-05-17 ENCOUNTER — Ambulatory Visit (INDEPENDENT_AMBULATORY_CARE_PROVIDER_SITE_OTHER): Payer: Medicare Other

## 2012-05-17 DIAGNOSIS — I2699 Other pulmonary embolism without acute cor pulmonale: Secondary | ICD-10-CM | POA: Diagnosis not present

## 2012-05-17 DIAGNOSIS — I82409 Acute embolism and thrombosis of unspecified deep veins of unspecified lower extremity: Secondary | ICD-10-CM

## 2012-05-17 DIAGNOSIS — Z7901 Long term (current) use of anticoagulants: Secondary | ICD-10-CM

## 2012-05-17 DIAGNOSIS — I824Z9 Acute embolism and thrombosis of unspecified deep veins of unspecified distal lower extremity: Secondary | ICD-10-CM

## 2012-05-17 LAB — POCT INR: INR: 4.2

## 2012-05-17 NOTE — Telephone Encounter (Signed)
Left message on machine for pt to contact the office and schedule appointment with Dr Riley Kill.

## 2012-05-18 ENCOUNTER — Telehealth: Payer: Self-pay | Admitting: Gastroenterology

## 2012-05-18 NOTE — Telephone Encounter (Signed)
Faxed prior authorization form to [facility] for Xifaxin.  Will await reply and contact patient.

## 2012-05-20 NOTE — Telephone Encounter (Signed)
Pt scheduled to see Dr Riley Kill on 05/26/12.

## 2012-05-24 ENCOUNTER — Telehealth: Payer: Self-pay | Admitting: *Deleted

## 2012-05-24 NOTE — Telephone Encounter (Signed)
Dr Arlyce Dice you had presribed Xifaxin 200mg  TID for 5 days for this pt  Insurance will not cover it  I called for some samples for the pt but they are 550mg  How do you want me to prescribe them for the pt/.... We only have 18 pills

## 2012-05-24 NOTE — Telephone Encounter (Signed)
Just give her the samples we have which should be sufficient.

## 2012-05-24 NOTE — Telephone Encounter (Signed)
Called Cyle the Drug Rep for Xifaxian. I have patient coming in for samples on Thursday  Samples will be here today but the pt cant get here before Thursday  Insurance denied [employer]

## 2012-05-24 NOTE — Telephone Encounter (Signed)
Pt called to pick up samples

## 2012-05-26 ENCOUNTER — Encounter: Payer: Self-pay | Admitting: Cardiology

## 2012-05-26 ENCOUNTER — Ambulatory Visit (INDEPENDENT_AMBULATORY_CARE_PROVIDER_SITE_OTHER): Payer: Medicare Other

## 2012-05-26 ENCOUNTER — Encounter (INDEPENDENT_AMBULATORY_CARE_PROVIDER_SITE_OTHER): Payer: Medicare Other

## 2012-05-26 ENCOUNTER — Ambulatory Visit (INDEPENDENT_AMBULATORY_CARE_PROVIDER_SITE_OTHER): Payer: Medicare Other | Admitting: Cardiology

## 2012-05-26 VITALS — BP 130/70 | HR 64 | Ht 67.0 in | Wt 185.0 lb

## 2012-05-26 DIAGNOSIS — M79609 Pain in unspecified limb: Secondary | ICD-10-CM | POA: Diagnosis not present

## 2012-05-26 DIAGNOSIS — I82402 Acute embolism and thrombosis of unspecified deep veins of left lower extremity: Secondary | ICD-10-CM

## 2012-05-26 DIAGNOSIS — Z86718 Personal history of other venous thrombosis and embolism: Secondary | ICD-10-CM | POA: Diagnosis not present

## 2012-05-26 DIAGNOSIS — I824Z9 Acute embolism and thrombosis of unspecified deep veins of unspecified distal lower extremity: Secondary | ICD-10-CM | POA: Diagnosis not present

## 2012-05-26 DIAGNOSIS — I2699 Other pulmonary embolism without acute cor pulmonale: Secondary | ICD-10-CM | POA: Diagnosis not present

## 2012-05-26 DIAGNOSIS — I82409 Acute embolism and thrombosis of unspecified deep veins of unspecified lower extremity: Secondary | ICD-10-CM

## 2012-05-26 DIAGNOSIS — I10 Essential (primary) hypertension: Secondary | ICD-10-CM

## 2012-05-26 DIAGNOSIS — Z7901 Long term (current) use of anticoagulants: Secondary | ICD-10-CM | POA: Diagnosis not present

## 2012-05-26 DIAGNOSIS — I2510 Atherosclerotic heart disease of native coronary artery without angina pectoris: Secondary | ICD-10-CM | POA: Diagnosis not present

## 2012-05-26 NOTE — Patient Instructions (Addendum)
Your physician recommends that you schedule a follow-up appointment in: 4 MONTHS with Dr Excell Seltzer (previous pt of Dr Riley Kill)  Your physician recommends that you continue on your current medications as directed. Please refer to the Current Medication list given to you today.  Your physician has requested that you have a lower extremity venous duplex. This test is an ultrasound of the veins in the legs.  It looks at venous blood flow that carries blood from the heart to the legs. Allow one hour for a Lower Venous exam. There are no restrictions or special instructions.

## 2012-05-27 NOTE — Assessment & Plan Note (Signed)
The patient has a prior pulmonary embolus, and is on warfarin anticoagulation. He has some tenderness in his foot, and it is tender to palpation. It does not look like recurrent DVT to me, but the patient would like a repeat Doppler in am okay with going ahead with this.

## 2012-05-27 NOTE — Assessment & Plan Note (Signed)
The patient remains stable from a cardiac standpoint this point in time.

## 2012-05-27 NOTE — Assessment & Plan Note (Signed)
Well-controlled at the present time. ?

## 2012-05-27 NOTE — Progress Notes (Signed)
HPI:   This nice patient comes in for followup of his major concern is that of his foot. He's had some trouble bearing weight on it. He's concerned that he might have a recurrent clot. We discussed the options today, and he would like to have another venous Doppler study. I think this seems appropriate. He's not had a lot of swelling in his foot, but nonetheless, the site is been tender although not erythematous. He denies any ongoing chest pain at the present time. He is on medical regimen includes both metoprolol and isosorbide. He is also on an ACE inhibitor. Notably, he remains on warfarin because of his marked prior DVT, partially in the setting of a myeloid leukemia. Current Outpatient Prescriptions  Medication Sig Dispense Refill  . esomeprazole (NEXIUM) 40 MG capsule Take 40 mg by mouth daily before breakfast.      . ibuprofen (ADVIL,MOTRIN) 800 MG tablet AS NEEDED      . imatinib (GLEEVEC) 400 MG tablet Take 400 mg by mouth daily.        . isosorbide mononitrate (IMDUR) 30 MG 24 hr tablet Take 30 mg by mouth daily.      Marland Kitchen lisinopril (PRINIVIL,ZESTRIL) 5 MG tablet Take 1 tablet (5 mg total) by mouth daily.  90 tablet  3  . loperamide (IMODIUM) 2 MG capsule Take 2 mg by mouth 4 (four) times daily as needed. For diarrhea      . LORazepam (ATIVAN) 0.5 MG tablet Take 0.5 mg by mouth at bedtime as needed. For sleep.       . metFORMIN (GLUCOPHAGE-XR) 500 MG 24 hr tablet TAKE 1 TABLET BY MOUTH EVERY DAY IN THE MORNING  90 tablet  2  . metoCLOPramide (REGLAN) 10 MG tablet Take 1 tablet (10 mg total) by mouth 4 (four) times daily.  60 tablet  5  . metoprolol succinate (TOPROL-XL) 25 MG 24 hr tablet Take 1 tablet (25 mg total) by mouth daily.  90 tablet  3  . Multiple Vitamins-Minerals (OCUVITE PO) Take 1 tablet by mouth daily.      . ondansetron (ZOFRAN) 4 MG tablet Take 4 mg by mouth every 6 (six) hours as needed. For nausea.      . predniSONE (DELTASONE) 5 MG tablet Take 5 mg by mouth every  other day.      . pyridostigmine (MESTINON) 60 MG tablet Take 60 mg by mouth daily.       . rifaximin (XIFAXAN) 200 MG tablet Take 1 tablet (200 mg total) by mouth 3 (three) times daily.  15 tablet  2  . rosuvastatin (CRESTOR) 10 MG tablet Take 1 tablet (10 mg total) by mouth daily.  90 tablet  3  . traMADol (ULTRAM) 50 MG tablet Take 50 mg by mouth every 4 (four) hours as needed. For pain      . warfarin (COUMADIN) 5 MG tablet Take 1 tablet (5 mg total) by mouth as directed.  30 tablet  3  . [DISCONTINUED] zolpidem (AMBIEN) 10 MG tablet Take 10 mg by mouth at bedtime as needed.        No current facility-administered medications for this visit.    Allergies  Allergen Reactions  . Oxycodone-Acetaminophen Other (See Comments)    Went "out of head"  . Sulfamethoxazole Rash    Past Medical History  Diagnosis Date  . Coronary atherosclerosis of unspecified type of vessel, native or graft     remote cath in 2003 showing distal and branch vessel  CAD with normal LV function; managed medically  . Mixed hyperlipidemia   . Glucose intolerance (impaired glucose tolerance)     history of  . Diverticulosis of colon (without mention of hemorrhage)   . Hx of colonic polyps   . Internal hemorrhoids without mention of complication   . External hemorrhoids without mention of complication   . Rash and other nonspecific skin eruption   . Pneumonia, organism unspecified   . Conjunctivitis unspecified   . Nephrolithiasis   . Myasthenia gravis     uses prednisone  . GERD (gastroesophageal reflux disease)   . Pulmonary embolism June 2013  . DVT (deep venous thrombosis) June 2013  . Chronic anticoagulation   . Diabetes mellitus   . DJD (degenerative joint disease)   . Ptosis   . CML (chronic myeloid leukemia)   . HTN (hypertension)   . IBS (irritable bowel syndrome)   . History of pneumonia   . Dystonia     Past Surgical History  Procedure Laterality Date  . Angioplasty  2001  . Back  surgery      x2, lumbar and cervical  . Tonsillectomy    . Cataract extraction Bilateral 12/12    Family History  Problem Relation Age of Onset  . Heart disease Mother   . Heart attack Mother   . Stroke Mother   . Heart disease Father   . Heart attack Father   . Stroke Father   . Breast cancer Sister     Twin   . Heart disease Sister   . Clotting disorder Sister     History   Social History  . Marital Status: Married    Spouse Name: N/A    Number of Children: 2  . Years of Education: N/A   Occupational History  . retired    Social History Main Topics  . Smoking status: Never Smoker   . Smokeless tobacco: Never Used  . Alcohol Use: No  . Drug Use: No  . Sexually Active: Not Currently   Other Topics Concern  . Not on file   Social History Narrative  . No narrative on file    ROS: Please see the HPI.  All other systems reviewed and negative.  PHYSICAL EXAM:  BP 130/70  Pulse 64  Ht 5\' 7"  (1.702 m)  Wt 185 lb (83.915 kg)  BMI 28.97 kg/m2  SpO2 97%  General: Well developed, well nourished, in no acute distress. Head:  Normocephalic and atraumatic. Neck: no JVD Lungs: Clear to auscultation and percussion. Heart: Normal S1 and S2.  No murmur, rubs or gallops.  Abdomen:  Normal bowel sounds; soft; non tender; no organomegaly Pulses: Pulses normal in all 4 extremities. Extremities: No clubbing or cyanosis. No edema.  Small knot L foot.  Doubt DVT.   Neurologic: Alert and oriented x 3.  EKG:  ASSESSMENT AND PLAN:

## 2012-06-01 ENCOUNTER — Ambulatory Visit (INDEPENDENT_AMBULATORY_CARE_PROVIDER_SITE_OTHER): Payer: Medicare Other | Admitting: Neurology

## 2012-06-01 ENCOUNTER — Encounter: Payer: Self-pay | Admitting: Neurology

## 2012-06-01 VITALS — BP 138/69 | HR 70 | Wt 185.0 lb

## 2012-06-01 DIAGNOSIS — G7001 Myasthenia gravis with (acute) exacerbation: Secondary | ICD-10-CM

## 2012-06-01 DIAGNOSIS — M48061 Spinal stenosis, lumbar region without neurogenic claudication: Secondary | ICD-10-CM

## 2012-06-01 NOTE — Progress Notes (Signed)
Reason for visit: Back pain  [name] is an 72 y.o. 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 The patient has in the past gotten epidural steroid injections with variable benefit. The patient recently had an injection in March of 2014 which last only 2 weeks with pain improvement. The patient has had return of his discomfort into the right hip, and down the right leg to the foot. The patient has numbness in this distribution as well, and he feels as if the right leg is getting weak. The patient indicates that he can only walk about one quarter of a mile before he gets significant pain, and has to rest. The patient has had a recent MRI of the lumbosacral spine done at [facility]. The patient indicates that his myasthenia gravis is doing fairly well.  Past Medical History  Diagnosis Date  . Coronary atherosclerosis of unspecified type of vessel, native or graft     remote cath in 2003 showing distal and branch vessel CAD with normal LV function; managed medically  . Mixed hyperlipidemia   . Glucose intolerance (impaired glucose tolerance)     history of  . Diverticulosis of colon (without mention of hemorrhage)   . Hx of colonic polyps   . Internal hemorrhoids without mention of complication   . External hemorrhoids without mention of complication   . Rash and other nonspecific skin eruption   . Pneumonia, organism unspecified   . Conjunctivitis unspecified   . Nephrolithiasis   . Myasthenia gravis     uses prednisone  . GERD (gastroesophageal reflux disease)   . Pulmonary embolism June 2013  . DVT (deep venous thrombosis) June 2013  . Chronic anticoagulation   . Diabetes mellitus   . DJD (degenerative joint disease)   . Ptosis   . CML (chronic myeloid  leukemia)   . HTN (hypertension)   . IBS (irritable bowel syndrome)   . History of pneumonia   . Dystonia   . High cholesterol   . Heart disease   . Cancer   . Spinal stenosis of lumbar region 06/01/2012    Past Surgical History  Procedure Laterality Date  . Angioplasty  2001  . Back surgery      x2, lumbar and cervical  . Tonsillectomy    . Cataract extraction Bilateral 12/12  . Shoulder surgery Left 05/07/2009    Family History  Problem Relation Age of Onset  . Heart disease Mother   . Heart attack Mother   . Stroke Mother   . Heart disease Father   . Heart attack Father   . Stroke Father   . Breast cancer Sister     Twin   . Heart attack Sister   . Dementia Sister   . Heart disease Sister   . Heart attack Sister   . Clotting disorder Sister   . Heart attack Sister     Social history:  reports that he has never smoked. He has never used smokeless tobacco. He reports that he does not drink alcohol or use illicit drugs.  Allergies:  Allergies  Allergen Reactions  . Oxycodone-Acetaminophen Other (See Comments)    Went "out of head"  . Sulfamethoxazole Rash    Medications:  Current Outpatient Prescriptions on File Prior  to Visit  Medication Sig Dispense Refill  . esomeprazole (NEXIUM) 40 MG capsule Take 40 mg by mouth daily before breakfast.      . ibuprofen (ADVIL,MOTRIN) 800 MG tablet AS NEEDED      . imatinib (GLEEVEC) 400 MG tablet Take 400 mg by mouth daily.        . isosorbide mononitrate (IMDUR) 30 MG 24 hr tablet Take 30 mg by mouth daily.      Marland Kitchen lisinopril (PRINIVIL,ZESTRIL) 5 MG tablet Take 1 tablet (5 mg total) by mouth daily.  90 tablet  3  . loperamide (IMODIUM) 2 MG capsule Take 2 mg by mouth 4 (four) times daily as needed. For diarrhea      . LORazepam (ATIVAN) 0.5 MG tablet Take 0.5 mg by mouth at bedtime as needed. For sleep.       . metFORMIN (GLUCOPHAGE-XR) 500 MG 24 hr tablet TAKE 1 TABLET BY MOUTH EVERY DAY IN THE MORNING  90 tablet  2  .  metoCLOPramide (REGLAN) 10 MG tablet Take 1 tablet (10 mg total) by mouth 4 (four) times daily.  60 tablet  5  . metoprolol succinate (TOPROL-XL) 25 MG 24 hr tablet Take 1 tablet (25 mg total) by mouth daily.  90 tablet  3  . Multiple Vitamins-Minerals (OCUVITE PO) Take 1 tablet by mouth daily.      . ondansetron (ZOFRAN) 4 MG tablet Take 4 mg by mouth every 6 (six) hours as needed. For nausea.      . predniSONE (DELTASONE) 5 MG tablet Take 5 mg by mouth every other day.      . pyridostigmine (MESTINON) 60 MG tablet Take 60 mg by mouth daily.       . rosuvastatin (CRESTOR) 10 MG tablet Take 1 tablet (10 mg total) by mouth daily.  90 tablet  3  . traMADol (ULTRAM) 50 MG tablet Take 50 mg by mouth every 4 (four) hours as needed. For pain      . warfarin (COUMADIN) 5 MG tablet Take 1 tablet (5 mg total) by mouth as directed.  30 tablet  3  . [DISCONTINUED] zolpidem (AMBIEN) 10 MG tablet Take 10 mg by mouth at bedtime as needed.        No current facility-administered medications on file prior to visit.    ROS:  Out of a complete 14 system review of symptoms, the patient complains only of the following symptoms, and all other reviewed systems are negative.  Back pain Numbness Weakness  Blood pressure 138/69, pulse 70, weight 185 lb (83.915 kg).  Physical Exam  General: The patient is alert and cooperative at the time of the examination. The patient is moderately obese.  Skin: No significant peripheral edema is noted.   Neurologic Exam  Cranial nerves: Facial symmetry is present. Speech is normal, no aphasia or dysarthria is noted. Extraocular movements are full. Visual fields are full.  Motor: The patient has good strength in all 4 extremities. The patient is able to walk on the heels and toes.  Coordination: The patient has good finger-nose-finger and heel-to-shin bilaterally.  Gait and station: The patient has a normal gait. Tandem gait is slightly unsteady. Romberg is negative.  No drift is seen.  Reflexes: Deep tendon reflexes are symmetric.   Assessment/Plan:  1. 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 Myasthenia gravis with ocular features   The patient will be set up for an epidural steroid injection once again. If this is not effective, the patient  will be sent for a surgical referral of the lumbosacral spinal stenosis and the L5-S1 neuroforaminal stenosis. The patient will contact me if he has any further issues. Otherwise, the patient will followup in about 6 months.  Marlan Palau MD 06/01/2012 7:07 PM  [facility] [address] [place], [place] [address]  Phone [phone] Fax [phone]

## 2012-06-03 ENCOUNTER — Encounter: Payer: Self-pay | Admitting: Neurology

## 2012-06-06 ENCOUNTER — Encounter: Payer: Self-pay | Admitting: Oncology

## 2012-06-07 ENCOUNTER — Other Ambulatory Visit: Payer: Self-pay

## 2012-06-07 ENCOUNTER — Telehealth: Payer: Self-pay

## 2012-06-07 ENCOUNTER — Telehealth: Payer: Self-pay | Admitting: Internal Medicine

## 2012-06-07 ENCOUNTER — Inpatient Hospital Stay (HOSPITAL_COMMUNITY): Payer: Medicare Other

## 2012-06-07 ENCOUNTER — Inpatient Hospital Stay (HOSPITAL_COMMUNITY)
Admission: EM | Admit: 2012-06-07 | Discharge: 2012-06-10 | DRG: 392 | Disposition: A | Discharge status: Home / Self Care | Payer: Medicare Other | Attending: Internal Medicine | Admitting: Internal Medicine

## 2012-06-07 ENCOUNTER — Encounter (HOSPITAL_COMMUNITY): Payer: Self-pay | Admitting: Emergency Medicine

## 2012-06-07 DIAGNOSIS — Z66 Do not resuscitate: Secondary | ICD-10-CM | POA: Diagnosis present

## 2012-06-07 DIAGNOSIS — K219 Gastro-esophageal reflux disease without esophagitis: Secondary | ICD-10-CM | POA: Diagnosis not present

## 2012-06-07 DIAGNOSIS — R109 Unspecified abdominal pain: Secondary | ICD-10-CM | POA: Diagnosis not present

## 2012-06-07 DIAGNOSIS — I825Z9 Chronic embolism and thrombosis of unspecified deep veins of unspecified distal lower extremity: Secondary | ICD-10-CM | POA: Diagnosis present

## 2012-06-07 DIAGNOSIS — R609 Edema, unspecified: Secondary | ICD-10-CM | POA: Diagnosis not present

## 2012-06-07 DIAGNOSIS — I2782 Chronic pulmonary embolism: Secondary | ICD-10-CM | POA: Diagnosis present

## 2012-06-07 DIAGNOSIS — R1013 Epigastric pain: Secondary | ICD-10-CM

## 2012-06-07 DIAGNOSIS — K222 Esophageal obstruction: Secondary | ICD-10-CM | POA: Diagnosis present

## 2012-06-07 DIAGNOSIS — R197 Diarrhea, unspecified: Secondary | ICD-10-CM | POA: Diagnosis not present

## 2012-06-07 DIAGNOSIS — I2510 Atherosclerotic heart disease of native coronary artery without angina pectoris: Secondary | ICD-10-CM | POA: Diagnosis present

## 2012-06-07 DIAGNOSIS — Z86718 Personal history of other venous thrombosis and embolism: Secondary | ICD-10-CM | POA: Diagnosis not present

## 2012-06-07 DIAGNOSIS — I2699 Other pulmonary embolism without acute cor pulmonale: Secondary | ICD-10-CM | POA: Diagnosis not present

## 2012-06-07 DIAGNOSIS — R112 Nausea with vomiting, unspecified: Secondary | ICD-10-CM | POA: Diagnosis present

## 2012-06-07 DIAGNOSIS — G7000 Myasthenia gravis without (acute) exacerbation: Secondary | ICD-10-CM | POA: Diagnosis present

## 2012-06-07 DIAGNOSIS — I10 Essential (primary) hypertension: Secondary | ICD-10-CM | POA: Diagnosis present

## 2012-06-07 DIAGNOSIS — Z86711 Personal history of pulmonary embolism: Secondary | ICD-10-CM | POA: Diagnosis present

## 2012-06-07 DIAGNOSIS — C9210 Chronic myeloid leukemia, BCR/ABL-positive, not having achieved remission: Secondary | ICD-10-CM | POA: Diagnosis not present

## 2012-06-07 DIAGNOSIS — K5289 Other specified noninfective gastroenteritis and colitis: Principal | ICD-10-CM | POA: Diagnosis present

## 2012-06-07 DIAGNOSIS — E782 Mixed hyperlipidemia: Secondary | ICD-10-CM | POA: Diagnosis present

## 2012-06-07 DIAGNOSIS — Z7901 Long term (current) use of anticoagulants: Secondary | ICD-10-CM

## 2012-06-07 DIAGNOSIS — C9211 Chronic myeloid leukemia, BCR/ABL-positive, in remission: Secondary | ICD-10-CM | POA: Diagnosis present

## 2012-06-07 DIAGNOSIS — E119 Type 2 diabetes mellitus without complications: Secondary | ICD-10-CM | POA: Diagnosis not present

## 2012-06-07 DIAGNOSIS — Z794 Long term (current) use of insulin: Secondary | ICD-10-CM | POA: Diagnosis not present

## 2012-06-07 DIAGNOSIS — E860 Dehydration: Secondary | ICD-10-CM | POA: Diagnosis present

## 2012-06-07 DIAGNOSIS — Z79899 Other long term (current) drug therapy: Secondary | ICD-10-CM | POA: Diagnosis not present

## 2012-06-07 DIAGNOSIS — R791 Abnormal coagulation profile: Secondary | ICD-10-CM | POA: Diagnosis present

## 2012-06-07 LAB — CBC WITH DIFFERENTIAL/PLATELET
Lymphocytes Relative: 10 % — ABNORMAL LOW (ref 12–46)
Lymphs Abs: 0.9 10*3/uL (ref 0.7–4.0)
Neutro Abs: 7.5 10*3/uL (ref 1.7–7.7)
Neutrophils Relative %: 81 % — ABNORMAL HIGH (ref 43–77)
Platelets: 232 10*3/uL (ref 150–400)
RBC: 4.38 MIL/uL (ref 4.22–5.81)
WBC: 9.4 10*3/uL (ref 4.0–10.5)

## 2012-06-07 LAB — URINALYSIS, ROUTINE W REFLEX MICROSCOPIC
Bilirubin Urine: NEGATIVE
Glucose, UA: NEGATIVE mg/dL
Ketones, ur: NEGATIVE mg/dL
Nitrite: NEGATIVE
Specific Gravity, Urine: 1.01 (ref 1.005–1.030)
pH: 6.5 (ref 5.0–8.0)

## 2012-06-07 LAB — COMPREHENSIVE METABOLIC PANEL
ALT: 29 U/L (ref 0–53)
Alkaline Phosphatase: 60 U/L (ref 39–117)
CO2: 27 mEq/L (ref 19–32)
Chloride: 99 mEq/L (ref 96–112)
GFR calc Af Amer: 58 mL/min — ABNORMAL LOW (ref >90)
Glucose, Bld: 121 mg/dL — ABNORMAL HIGH (ref 70–99)
Potassium: 3.5 mEq/L (ref 3.5–5.1)
Sodium: 138 mEq/L (ref 135–145)
Total Protein: 6.7 g/dL (ref 6.0–8.3)

## 2012-06-07 LAB — MAGNESIUM: Magnesium: 2.1 mg/dL (ref 1.5–2.5)

## 2012-06-07 LAB — TROPONIN I: Troponin I: <0.3 ng/mL (ref <0.30)

## 2012-06-07 LAB — URINE MICROSCOPIC-ADD ON

## 2012-06-07 LAB — PROTIME-INR: INR: 1.38 (ref 0.00–1.49)

## 2012-06-07 MED ORDER — POLYETHYLENE GLYCOL 3350 17 G PO PACK
17.0000 g | PACK | Freq: Every day | ORAL | Status: DC | PRN
  Filled 2012-06-07: qty 1

## 2012-06-07 MED ORDER — ACETAMINOPHEN 325 MG PO TABS: 650.0000 mg | ORAL_TABLET | Freq: Four times a day (QID) | ORAL | Status: DC | PRN

## 2012-06-07 MED ORDER — ALUM & MAG HYDROXIDE-SIMETH 200-200-20 MG/5ML PO SUSP: 30.0000 mL | Freq: Four times a day (QID) | ORAL | Status: DC | PRN

## 2012-06-07 MED ORDER — ENOXAPARIN SODIUM 100 MG/ML ~~LOC~~ SOLN
80.0000 mg | Freq: Once | SUBCUTANEOUS | Status: AC
  Administered 2012-06-07: 80 mg via SUBCUTANEOUS
  Filled 2012-06-07: qty 1

## 2012-06-07 MED ORDER — PYRIDOSTIGMINE BROMIDE 60 MG PO TABS
90.0000 mg | ORAL_TABLET | Freq: Every day | ORAL | Status: DC
  Administered 2012-06-07 – 2012-06-10 (×4): 90 mg via ORAL
  Filled 2012-06-07 (×4): qty 1.5

## 2012-06-07 MED ORDER — ENOXAPARIN SODIUM 80 MG/0.8ML ~~LOC~~ SOLN
80.0000 mg | Freq: Two times a day (BID) | SUBCUTANEOUS | Status: DC
  Administered 2012-06-07 – 2012-06-08 (×3): 80 mg via SUBCUTANEOUS
  Filled 2012-06-07 (×5): qty 0.8

## 2012-06-07 MED ORDER — FLEET ENEMA 7-19 GM/118ML RE ENEM: 1.0000 | ENEMA | Freq: Once | RECTAL | Status: AC | PRN

## 2012-06-07 MED ORDER — WARFARIN - PHARMACIST DOSING INPATIENT: Freq: Every day | Status: DC

## 2012-06-07 MED ORDER — LORAZEPAM 2 MG/ML IJ SOLN: 0.5000 mg | Freq: Two times a day (BID) | INTRAMUSCULAR | Status: DC | PRN

## 2012-06-07 MED ORDER — PROCHLORPERAZINE EDISYLATE 5 MG/ML IJ SOLN: 10.0000 mg | Freq: Four times a day (QID) | INTRAMUSCULAR | Status: DC | PRN

## 2012-06-07 MED ORDER — INSULIN ASPART 100 UNIT/ML ~~LOC~~ SOLN
0.0000 [IU] | Freq: Three times a day (TID) | SUBCUTANEOUS | Status: DC
  Administered 2012-06-08 – 2012-06-09 (×2): 1 [IU] via SUBCUTANEOUS

## 2012-06-07 MED ORDER — SODIUM CHLORIDE 0.9 % IV SOLN
INTRAVENOUS | Status: DC
  Administered 2012-06-07 – 2012-06-08 (×2): via INTRAVENOUS
  Administered 2012-06-08: 1000 mL via INTRAVENOUS
  Administered 2012-06-09: 06:00:00 via INTRAVENOUS

## 2012-06-07 MED ORDER — MORPHINE SULFATE 2 MG/ML IJ SOLN: 1.0000 mg | INTRAMUSCULAR | Status: DC | PRN

## 2012-06-07 MED ORDER — ACETAMINOPHEN 650 MG RE SUPP: 650.0000 mg | Freq: Four times a day (QID) | RECTAL | Status: DC | PRN

## 2012-06-07 MED ORDER — ALBUTEROL SULFATE (5 MG/ML) 0.5% IN NEBU: 2.5000 mg | INHALATION_SOLUTION | RESPIRATORY_TRACT | Status: DC | PRN

## 2012-06-07 MED ORDER — PROMETHAZINE HCL 25 MG/ML IJ SOLN
25.0000 mg | Freq: Once | INTRAMUSCULAR | Status: AC
  Administered 2012-06-07: 25 mg via INTRAVENOUS
  Filled 2012-06-07: qty 1

## 2012-06-07 MED ORDER — SODIUM CHLORIDE 0.9 % IV SOLN
1000.0000 mL | Freq: Once | INTRAVENOUS | Status: AC
  Administered 2012-06-07: 1000 mL via INTRAVENOUS

## 2012-06-07 MED ORDER — ONDANSETRON HCL 4 MG/2ML IJ SOLN: 8.0000 mg | Freq: Three times a day (TID) | INTRAMUSCULAR | Status: DC

## 2012-06-07 MED ORDER — PANTOPRAZOLE SODIUM 40 MG IV SOLR
40.0000 mg | INTRAVENOUS | Status: DC
  Administered 2012-06-07 – 2012-06-09 (×3): 40 mg via INTRAVENOUS
  Filled 2012-06-07 (×4): qty 40

## 2012-06-07 MED ORDER — ONDANSETRON 8 MG/NS 50 ML IVPB
8.0000 mg | Freq: Three times a day (TID) | INTRAVENOUS | Status: AC
  Administered 2012-06-07 – 2012-06-09 (×6): 8 mg via INTRAVENOUS
  Filled 2012-06-07 (×8): qty 8

## 2012-06-07 MED ORDER — PYRIDOSTIGMINE BROMIDE 60 MG PO TABS: 90.0000 mg | ORAL_TABLET | Freq: Every day | ORAL | Status: DC

## 2012-06-07 MED ORDER — ONDANSETRON HCL 4 MG/2ML IJ SOLN
4.0000 mg | Freq: Once | INTRAMUSCULAR | Status: AC
  Administered 2012-06-07: 4 mg via INTRAVENOUS
  Filled 2012-06-07: qty 2

## 2012-06-07 MED ORDER — HYDROMORPHONE HCL PF 1 MG/ML IJ SOLN
0.5000 mg | Freq: Once | INTRAMUSCULAR | Status: AC
  Administered 2012-06-07: 1 mg via INTRAVENOUS
  Filled 2012-06-07: qty 1

## 2012-06-07 MED ORDER — IPRATROPIUM BROMIDE 0.02 % IN SOLN: 0.5000 mg | RESPIRATORY_TRACT | Status: DC | PRN

## 2012-06-07 MED ORDER — WARFARIN SODIUM 5 MG PO TABS
5.0000 mg | ORAL_TABLET | Freq: Once | ORAL | Status: AC
  Administered 2012-06-07: 5 mg via ORAL
  Filled 2012-06-07: qty 1

## 2012-06-07 MED ORDER — SORBITOL 70 % SOLN
30.0000 mL | Freq: Every day | Status: DC | PRN
  Administered 2012-06-09: 30 mL via ORAL
  Filled 2012-06-07: qty 30

## 2012-06-07 MED ORDER — PREDNISONE 5 MG PO TABS
5.0000 mg | ORAL_TABLET | ORAL | Status: DC
  Administered 2012-06-07 – 2012-06-09 (×2): 5 mg via ORAL
  Filled 2012-06-07 (×2): qty 1

## 2012-06-07 MED ORDER — METOPROLOL TARTRATE 1 MG/ML IV SOLN
5.0000 mg | Freq: Three times a day (TID) | INTRAVENOUS | Status: DC
  Administered 2012-06-07 – 2012-06-08 (×3): 5 mg via INTRAVENOUS
  Filled 2012-06-07 (×6): qty 5

## 2012-06-07 MED ORDER — SODIUM CHLORIDE 0.9 % IJ SOLN
3.0000 mL | Freq: Two times a day (BID) | INTRAMUSCULAR | Status: DC
  Administered 2012-06-07 – 2012-06-09 (×3): 3 mL via INTRAVENOUS

## 2012-06-07 MED ORDER — ISOSORBIDE MONONITRATE ER 30 MG PO TB24
30.0000 mg | ORAL_TABLET | Freq: Every day | ORAL | Status: DC
  Administered 2012-06-07 – 2012-06-10 (×4): 30 mg via ORAL
  Filled 2012-06-07 (×4): qty 1

## 2012-06-07 NOTE — Telephone Encounter (Signed)
Several days of worsening and now incessant nausea vomiting despite ondansetron and metaclopramide. Also having diarrhea. May have started after going to [facility] over weekend but has had some chronic issues. Given persistent time without sig po intake (24 hrs) and co-morbidities that include leukemia, advised ED evaluation. Wife will take him to [facility].

## 2012-06-07 NOTE — ED Notes (Signed)
Pt continue to have n/v no relief with Zofran

## 2012-06-07 NOTE — Telephone Encounter (Signed)
Spouse called and left message in clinic inquiring about an order she thought was being placed last Wednesday.  Says it is for an additional shot in her husbands back.  Thinks they may have to go to [facility], but are not sure.  Can be reached on cell.  Thank you.

## 2012-06-07 NOTE — Progress Notes (Addendum)
ANTICOAGULATION CONSULT NOTE - Initial Consult  Pharmacy Consult for Warfarin Indication:  Hx of pulmonary embolus and DVT  Allergies  Allergen Reactions  . Oxycodone-Acetaminophen Other (See Comments)    Went "out of head"  . Sulfamethoxazole Rash    Patient Measurements:    Vital Signs: Temp: 98.1 F (36.7 C) (04/29 1334) Temp src: Oral (04/29 0809) BP: 145/82 mmHg (04/29 1334)  Labs:  Recent Labs  06/07/12 0900  HGB 14.0  HCT 40.9  PLT 232  LABPROT 16.6*  INR 1.38  CREATININE 1.35  TROPONINI <0.30    The CrCl is unknown because both a height and weight (above a minimum accepted value) are required for this calculation.   Medical History: Past Medical History  Diagnosis Date  . Coronary atherosclerosis of unspecified type of vessel, native or graft     remote cath in 2003 showing distal and branch vessel CAD with normal LV function; managed medically  . Mixed hyperlipidemia   . Glucose intolerance (impaired glucose tolerance)     history of  . Diverticulosis of colon (without mention of hemorrhage)   . Hx of colonic polyps   . Internal hemorrhoids without mention of complication   . External hemorrhoids without mention of complication   . Rash and other nonspecific skin eruption   . Pneumonia, organism unspecified   . Conjunctivitis unspecified   . Nephrolithiasis   . Myasthenia gravis     uses prednisone  . GERD (gastroesophageal reflux disease)   . Pulmonary embolism June 2013  . DVT (deep venous thrombosis) June 2013  . Chronic anticoagulation   . Diabetes mellitus   . DJD (degenerative joint disease)   . Ptosis   . CML (chronic myeloid leukemia)   . HTN (hypertension)   . IBS (irritable bowel syndrome)   . History of pneumonia   . Dystonia   . High cholesterol   . Heart disease   . Cancer   . Spinal stenosis of lumbar region 06/01/2012    Medications:  Scheduled:  . [COMPLETED] enoxaparin  80 mg Subcutaneous Once  . [COMPLETED]  HYDROmorphone  0.5 mg Intravenous Once  . insulin aspart  0-9 Units Subcutaneous TID WC  . isosorbide mononitrate  30 mg Oral Daily  . [COMPLETED] ondansetron  4 mg Intravenous Once  . ondansetron  8 mg Intravenous Q8H  . [COMPLETED] promethazine  25 mg Intravenous Once   Infusions:  . [COMPLETED] sodium chloride 1,000 mL (06/07/12 0912)  . sodium chloride      Assessment: 72 yo M admit 4/29 with N/V/D and abdominal pain.  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  Pharmacy is asked to assist with warfarin dosing inpatient.  PTA Warfarin dose: 2.5mg  daily except 5mg  Thurs.  Last dose taken 4/28 at 8am  INR (1.38) is subtherapeutic on admission.  CBC:  Hgb and Plt are normal  Lovenox x1 given in ED  Goal of Therapy:  INR 2-3 Monitor platelets by anticoagulation protocol: Yes   Plan:   Warfarin 5mg  PO today x1  Continue Lovenox 1 mg/kg SQ Q12h per MD  Daily PT/INR, CBC  Lynann Beaver PharmD, BCPS Pager [phone] 06/07/2012 2:58 PM

## 2012-06-07 NOTE — ED Notes (Signed)
Pt with c/o n/v/d x4days not relieved with Zofran and  Over the counter diarrhea meds

## 2012-06-07 NOTE — ED Provider Notes (Signed)
History     CSN: [id number]  Arrival date & time 06/07/12  7829   First MD Initiated Contact with Patient 06/07/12 [phone]      Chief Complaint  Patient presents with  . Nausea  . Emesis  . Diarrhea    (Consider location/radiation/quality/duration/timing/severity/associated sxs/prior treatment) HPI 72 y.o. Male complaining of nausea, vomiting, and diarrhea began Saturday night.  Vomited x muliple 8-9 in last two days- food initially, no blood or mucous, Loose stool many times, no blood or mucous.  No fever or chills, some epigastric pain-constant sharp to dull pain.  Feels like it would get better if he vomited more.  Patient with similar episodes in past with gastroparesis.  PMD Everardo All, card Shari Heritage, oncology- [place]/  Patient took zofran without relief, immodium and maalox.  Past Medical History  Diagnosis Date  . Coronary atherosclerosis of unspecified type of vessel, native or graft     remote cath in 2003 showing distal and branch vessel CAD with normal LV function; managed medically  . Mixed hyperlipidemia   . Glucose intolerance (impaired glucose tolerance)     history of  . Diverticulosis of colon (without mention of hemorrhage)   . Hx of colonic polyps   . Internal hemorrhoids without mention of complication   . External hemorrhoids without mention of complication   . Rash and other nonspecific skin eruption   . Pneumonia, organism unspecified   . Conjunctivitis unspecified   . Nephrolithiasis   . Myasthenia gravis     uses prednisone  . GERD (gastroesophageal reflux disease)   . Pulmonary embolism June 2013  . DVT (deep venous thrombosis) June 2013  . Chronic anticoagulation   . Diabetes mellitus   . DJD (degenerative joint disease)   . Ptosis   . CML (chronic myeloid leukemia)   . HTN (hypertension)   . IBS (irritable bowel syndrome)   . History of pneumonia   . Dystonia   . High cholesterol   . Heart disease   . Cancer   . Spinal stenosis of  lumbar region 06/01/2012    Past Surgical History  Procedure Laterality Date  . Angioplasty  2001  . Back surgery      x2, lumbar and cervical  . Tonsillectomy    . Cataract extraction Bilateral 12/12  . Shoulder surgery Left 05/07/2009    Family History  Problem Relation Age of Onset  . Heart disease Mother   . Heart attack Mother   . Stroke Mother   . Heart disease Father   . Heart attack Father   . Stroke Father   . Breast cancer Sister     Twin   . Heart attack Sister   . Dementia Sister   . Heart disease Sister   . Heart attack Sister   . Clotting disorder Sister   . Heart attack Sister     History  Substance Use Topics  . Smoking status: Never Smoker   . Smokeless tobacco: Never Used  . Alcohol Use: No      Review of Systems  All other systems reviewed and are negative.    Allergies  Oxycodone-acetaminophen and Sulfamethoxazole  Home Medications   Current Outpatient Rx  Name  Route  Sig  Dispense  Refill  . esomeprazole (NEXIUM) 40 MG capsule   Oral   Take 40 mg by mouth daily before breakfast.         . ibuprofen (ADVIL,MOTRIN) 800 MG tablet  AS NEEDED         . imatinib (GLEEVEC) 400 MG tablet   Oral   Take 400 mg by mouth daily.           . isosorbide mononitrate (IMDUR) 30 MG 24 hr tablet   Oral   Take 30 mg by mouth daily.         Marland Kitchen lisinopril (PRINIVIL,ZESTRIL) 5 MG tablet   Oral   Take 1 tablet (5 mg total) by mouth daily.   90 tablet   3   . loperamide (IMODIUM) 2 MG capsule   Oral   Take 2 mg by mouth 4 (four) times daily as needed. For diarrhea         . LORazepam (ATIVAN) 0.5 MG tablet   Oral   Take 0.5 mg by mouth at bedtime as needed. For sleep.          . metFORMIN (GLUCOPHAGE-XR) 500 MG 24 hr tablet      TAKE 1 TABLET BY MOUTH EVERY DAY IN THE MORNING   90 tablet   2   . metoCLOPramide (REGLAN) 10 MG tablet   Oral   Take 1 tablet (10 mg total) by mouth 4 (four) times daily.   60 tablet    5   . metoprolol succinate (TOPROL-XL) 25 MG 24 hr tablet   Oral   Take 1 tablet (25 mg total) by mouth daily.   90 tablet   3   . Multiple Vitamins-Minerals (OCUVITE PO)   Oral   Take 1 tablet by mouth daily.         . ondansetron (ZOFRAN) 4 MG tablet   Oral   Take 4 mg by mouth every 6 (six) hours as needed. For nausea.         . predniSONE (DELTASONE) 5 MG tablet   Oral   Take 5 mg by mouth every other day.         . pyridostigmine (MESTINON) 60 MG tablet   Oral   Take 1.5 tablets (90 mg total) by mouth daily.   135 tablet   1   . rosuvastatin (CRESTOR) 10 MG tablet   Oral   Take 1 tablet (10 mg total) by mouth daily.   90 tablet   3   . traMADol (ULTRAM) 50 MG tablet   Oral   Take 50 mg by mouth every 4 (four) hours as needed. For pain         . warfarin (COUMADIN) 5 MG tablet   Oral   Take 1 tablet (5 mg total) by mouth as directed.   30 tablet   3     BP 154/71  Temp(Src) 98.6 F (37 C) (Oral)  Resp 14  SpO2 98%  Physical Exam  Nursing note and vitals reviewed. Constitutional: He is oriented to person, place, and time. He appears well-developed and well-nourished.  HENT:  Head: Normocephalic and atraumatic.  Right Ear: External ear normal.  Left Ear: External ear normal.  Mouth/Throat: Oropharynx is clear and moist.  Eyes: Conjunctivae and EOM are normal. Pupils are equal, round, and reactive to light.  Neck: Normal range of motion. Neck supple.  Cardiovascular: Normal rate, regular rhythm, normal heart sounds and intact distal pulses.   Pulmonary/Chest: Effort normal and breath sounds normal.  Abdominal: Soft. Bowel sounds are normal. He exhibits no distension and no mass. There is no tenderness. There is no rebound and no guarding.  Musculoskeletal: Normal range of motion.  Neurological: He is alert and oriented to person, place, and time. He has normal reflexes.  Skin: Skin is warm and dry.  Psychiatric: He has a normal mood and  affect. His behavior is normal. Judgment and thought content normal.    ED Course  Procedures (including critical care time)  Labs Reviewed - No data to display No results found.   No diagnosis found.    MDM   Results for orders placed during the hospital encounter of 06/07/12  CBC WITH DIFFERENTIAL      Result Value Range   WBC 9.4  4.0 - 10.5 K/uL   RBC 4.38  4.22 - 5.81 MIL/uL   Hemoglobin 14.0  13.0 - 17.0 g/dL   HCT 16.1  09.6 - 04.5 %   MCV 93.4  78.0 - 100.0 fL   MCH 32.0  26.0 - 34.0 pg   MCHC 34.2  30.0 - 36.0 g/dL   RDW 40.9  81.1 - 91.4 %   Platelets 232  150 - 400 K/uL   Neutrophils Relative 81 (*) 43 - 77 %   Neutro Abs 7.5  1.7 - 7.7 K/uL   Lymphocytes Relative 10 (*) 12 - 46 %   Lymphs Abs 0.9  0.7 - 4.0 K/uL   Monocytes Relative 9  3 - 12 %   Monocytes Absolute 0.8  0.1 - 1.0 K/uL   Eosinophils Relative 1  0 - 5 %   Eosinophils Absolute 0.1  0.0 - 0.7 K/uL   Basophils Relative 0  0 - 1 %   Basophils Absolute 0.0  0.0 - 0.1 K/uL  COMPREHENSIVE METABOLIC PANEL      Result Value Range   Sodium 138  135 - 145 mEq/L   Potassium 3.5  3.5 - 5.1 mEq/L   Chloride 99  96 - 112 mEq/L   CO2 27  19 - 32 mEq/L   Glucose, Bld 121 (*) 70 - 99 mg/dL   BUN 14  6 - 23 mg/dL   Creatinine, Ser 7.82  0.50 - 1.35 mg/dL   Calcium 9.5  8.4 - 95.6 mg/dL   Total Protein 6.7  6.0 - 8.3 g/dL   Albumin 4.1  3.5 - 5.2 g/dL   AST 31  0 - 37 U/L   ALT 29  0 - 53 U/L   Alkaline Phosphatase 60  39 - 117 U/L   Total Bilirubin 0.8  0.3 - 1.2 mg/dL   GFR calc non Af Amer 50 (*) >90 mL/min   GFR calc Af Amer 58 (*) >90 mL/min  TROPONIN I      Result Value Range   Troponin I <0.30  <0.30 ng/mL  LIPASE, BLOOD      Result Value Range   Lipase 38  11 - 59 U/L  PROTIME-INR      Result Value Range   Prothrombin Time 16.6 (*) 11.6 - 15.2 seconds   INR 1.38  0.00 - 1.49    Date: 06/07/2012  Rate: 76  Rhythm: normal sinus rhythm pac  QRS Axis: normal  Intervals: normal  ST/T  Wave abnormalities: normal and poor r wave progression  Conduction Disutrbances:poor r wave progression  Narrative Interpretation:   Old EKG Reviewed: unchanged from 08/26/11      Patient with nausea, vomiting, and diarrhea.  Patient continues with emesis and nausea here.  He is given iv hydration, antiemetics, and pain medicine and feels improved but has still been unable to tolerate fluids.  Plan admission  for further hydration and antiemetics.  Patient on coumadin for dvt but is subtherapeutic. Plan lovenox.   Hilario Quarry, MD 06/07/12 1100

## 2012-06-07 NOTE — Telephone Encounter (Signed)
Former [facility] patient.  Has not been assigned new MD.  Auth refills via [facility]  

## 2012-06-07 NOTE — H&P (Signed)
[facility] History and Physical  [name] ION:[id number] DOB: [birth year] DOA: 06/07/2012  Referring physician: Dr Rosalia Hammers PCP: Romero Belling, MD  Specialists: Gastroenterology: Dr. Arlyce Dice cardiology: Dr. Riley Kill and Cooper/oncology: Dr. Truett Perna  Chief Complaint: Nausea, vomiting, abdominal pain, diarrhea,  HPI: [name] is a 72 y.o. 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 Patient stated he tried some Imodium with no help. Patient stated that he had been on Reglan which was subsequently discontinued 3 days prior to admission. Patient stated he had about 7-8 loose watery bowel stools daily. Patient denies any chest pain, no shortness of breath, no fever, no chills, no dysuria, no cough, no hematemesis, no hematochezia, no melena. Patient does endorse some generalized weakness as well. Patient was seen in the ED was noted to be dehydrated given a liter bolus. Patient also given some Zofran. Patient with no significant improvement and a such will call to admit the patient for further evaluation and management.   Review of Systems: The patient denies anorexia, fever, weight loss,, vision loss, decreased hearing, hoarseness, chest pain, syncope, dyspnea on exertion, peripheral edema, balance deficits, hemoptysis, abdominal pain, melena, hematochezia, severe indigestion/heartburn, hematuria, incontinence, genital sores, muscle weakness, suspicious skin lesions, transient blindness, difficulty walking, depression, unusual weight change, abnormal bleeding, enlarged lymph nodes, angioedema, and breast masses.   Past Medical History  Diagnosis Date  . Coronary atherosclerosis of unspecified type of vessel,  native or graft     remote cath in 2003 showing distal and branch vessel CAD with normal LV function; managed medically  . Mixed hyperlipidemia   . Glucose intolerance (impaired glucose tolerance)     history of  . Diverticulosis of colon (without mention of hemorrhage)   . Hx of colonic polyps   . Internal hemorrhoids without mention of complication   . External hemorrhoids without mention of complication   . Rash and other nonspecific skin eruption   . Pneumonia, organism unspecified   . Conjunctivitis unspecified   . Nephrolithiasis   . Myasthenia gravis     uses prednisone  . GERD (gastroesophageal reflux disease)   . Pulmonary embolism June 2013  . DVT (deep venous thrombosis) June 2013  . Chronic anticoagulation   . Diabetes mellitus   . DJD (degenerative joint disease)   . Ptosis   . CML (chronic myeloid leukemia)   . HTN (hypertension)   . IBS (irritable bowel syndrome)   . History of pneumonia   . Dystonia   . High cholesterol   . Heart disease   . Cancer   . Spinal stenosis of lumbar region 06/01/2012   Past Surgical History  Procedure Laterality Date  . Angioplasty  2001  . Back surgery      x2, lumbar and cervical  . Tonsillectomy    . Cataract extraction Bilateral 12/12  . Shoulder surgery Left 05/07/2009   Social History:  reports that he has never smoked. He has never used smokeless tobacco. He reports that he does not drink alcohol or use illicit drugs.  Allergies  Allergen Reactions  . Oxycodone-Acetaminophen Other (See Comments)    Went "out of head"  . Sulfamethoxazole Rash    Family History  Problem Relation Age of Onset  . Heart disease Mother   . Heart attack  Mother   . Stroke Mother   . Heart disease Father   . Heart attack Father   . Stroke Father   . Breast cancer Sister     Twin   . Heart attack Sister   . Dementia Sister   . Heart disease Sister   . Heart attack Sister   . Clotting disorder Sister   . Heart attack Sister      Prior to Admission medications   Medication Sig Start Date End Date Taking? Authorizing Provider  esomeprazole (NEXIUM) 40 MG capsule Take 40 mg by mouth daily before breakfast. 09/23/11  Yes Amy S Esterwood, PA-C  imatinib (GLEEVEC) 400 MG tablet Take 400 mg by mouth daily.     Yes Historical Provider, MD  isosorbide mononitrate (IMDUR) 30 MG 24 hr tablet Take 30 mg by mouth daily. 08/20/11  Yes Rosalio Macadamia, NP  lisinopril (PRINIVIL,ZESTRIL) 5 MG tablet Take 1 tablet (5 mg total) by mouth daily. 04/26/12  Yes Herby Abraham, MD  loperamide (IMODIUM) 2 MG capsule Take 2 mg by mouth 4 (four) times daily as needed. For diarrhea   Yes Historical Provider, MD  LORazepam (ATIVAN) 0.5 MG tablet Take 0.5 mg by mouth at bedtime as needed. For sleep.    Yes Historical Provider, MD  metFORMIN (GLUCOPHAGE-XR) 500 MG 24 hr tablet TAKE 1 TABLET BY MOUTH EVERY DAY IN THE MORNING 11/13/11  Yes Romero Belling, MD  metoprolol succinate (TOPROL-XL) 25 MG 24 hr tablet Take 1 tablet (25 mg total) by mouth daily. 01/13/12  Yes Herby Abraham, MD  Multiple Vitamins-Minerals (OCUVITE PO) Take 1 tablet by mouth daily.   Yes Historical Provider, MD  ondansetron (ZOFRAN) 4 MG tablet Take 4 mg by mouth every 6 (six) hours as needed. For nausea.   Yes Historical Provider, MD  predniSONE (DELTASONE) 5 MG tablet Take 5 mg by mouth every other day.   Yes Historical Provider, MD  pyridostigmine (MESTINON) 60 MG tablet Take 1.5 tablets (90 mg total) by mouth daily. 06/07/12  Yes York Spaniel, MD  rosuvastatin (CRESTOR) 10 MG tablet Take 1 tablet (10 mg total) by mouth daily. 01/13/12  Yes Herby Abraham, MD  traMADol (ULTRAM) 50 MG tablet Take 50 mg by mouth every 4 (four) hours as needed. For pain   Yes Historical Provider, MD  warfarin (COUMADIN) 5 MG tablet Take 2.5-5 mg by mouth See admin instructions. 2.5 mg Monday Tuesday Wednesday Friday Saturday Sunday, 5 mg on Thursday 03/02/12  Yes Herby Abraham, MD    Physical Exam: Filed Vitals:   06/07/12 0809 06/07/12 1026 06/07/12 1334  BP: 154/71 128/63 145/82  Temp: 98.6 F (37 C)  98.1 F (36.7 C)  TempSrc: Oral    Resp: 14 16 17   SpO2: 98% 99% 99%     General:  Well-developed well-nourished laying on a gurney in no acute cardiopulmonary distress. Emesis bag close by.  Eyes: Pupils equal round and reactive to light and accommodation. Extraocular movements intact.  ENT: Oropharynx is clear, no lesions, no exudates. Dry mucous membranes.  Neck: Supple with no lymphadenopathy. No JVD.  Cardiovascular: Regular rate rhythm no murmurs rubs or gallops. No JVD. No lower extremity edema.  Respiratory: Clear to auscultation bilaterally. No crackles, no wheezing, no rhonchi.  Abdomen: Distended, tight, tender to palpation in the epigastrium, decreased bowel sounds.  Skin: No rashes or lesions.  Musculoskeletal: 5/5 BUE strength,5/5 BLE strength.  Psychiatric: Normal mood. Normal affect. Good insight. Her  judgment.  Neurologic: Drowsy after receiving some Phenergan however easily arousable and following commands. Cranial nerves II through XII are grossly intact. No focal deficits.  Labs on Admission:  Basic Metabolic Panel:  Recent Labs Lab 06/07/12 0900  NA 138  K 3.5  CL 99  CO2 27  GLUCOSE 121*  BUN 14  CREATININE 1.35  CALCIUM 9.5   Liver Function Tests:  Recent Labs Lab 06/07/12 0900  AST 31  ALT 29  ALKPHOS 60  BILITOT 0.8  PROT 6.7  ALBUMIN 4.1    Recent Labs Lab 06/07/12 0900  LIPASE 38   No results found for this basename: AMMONIA,  in the last 168 hours CBC:  Recent Labs Lab 06/07/12 0900  WBC 9.4  NEUTROABS 7.5  HGB 14.0  HCT 40.9  MCV 93.4  PLT 232   Cardiac Enzymes:  Recent Labs Lab 06/07/12 0900  TROPONINI <0.30    BNP (last 3 results) No results found for this basename: PROBNP,  in the last 8760 hours CBG: No results found for this basename: GLUCAP,  in the last 168  hours  Radiological Exams on Admission: No results found.  EKG: Independently reviewed. Normal sinus rhythm with PACs  Assessment/Plan Principal Problem:   Nausea, vomiting and diarrhea Active Problems:   LEUKEMIA, MYELOID, CHRONIC   HYPERLIPIDEMIA, MIXED   HYPERTENSION   CORONARY ARTERY DISEASE   ABDOMINAL PAIN-EPIGASTRIC   Type II or unspecified type diabetes mellitus without mention of complication, not stated as uncontrolled   Pulmonary embolism   DVT of lower extremity (deep venous thrombosis)   Myasthenia gravis   GERD with stricture   Dehydration   Subtherapeutic international normalized ratio (INR)  #1 nausea vomiting diarrhea/abdominal pain Questionable etiology. Patient's abdomen is distended, with some tightness, and tender to palpation in the epigastrium. Lipase levels are within normal limits. Questionable etiology. May be secondary to a gastroenteritis versus secondary to chemotherapy versus abdominal pathology. Patient also having diarrhea and doubt if this necessarily gastroparesis. Will check a C. difficile PCR. Will check an acute abdominal series. Will cycle cardiac enzymes every 6 hours x3. Check a TSH. Place on bowel rest with n.p.o. IV fluids, antiemetics, supportive care. Will hold patient's Reglan. Follow.  #2 dehydration IV fluids.  #3 history of PE/left lower extremity DVT INR is subtherapeutic. We'll place on full dose Lovenox for now. Resume Coumadin. Once INR is greater than 2 will discontinue Lovenox.  #4 hypertension We'll place on IV Lopressor for now secondary to problem #1.  #5 gastroesophageal reflux disease with stricture PPI.  #6 myasthenia gravis Continue home dose prednisone and Mestinon.  #7 hyperlipidemia Hold statin for now.  #8 diabetes mellitus type 2 Check a hemoglobin A1c. Hold metformin. Place on sliding scale insulin.  #9 coronary artery disease Stable. Will cycle troponins every 6 hours x3. Troponins are abnormal will  check a 2-D echo. We'll monitor for now. Will place on IV Lopressor and Imdur will. Will hold lisinopril for now. Follow.  #10 prophylaxis PPI for GI prophylaxis. Lovenox for  DVT prophylaxis.    Code Status: DNR  Family Communication: Updated patient and wife at bedside Disposition Plan: Admit to telemetry  Time spent: 43 MINS  [facility] Pager [phone]  If 7PM-7AM, please contact night-coverage [email or web address] Password [facility] 06/07/2012, 1:58 PM

## 2012-06-08 DIAGNOSIS — E860 Dehydration: Secondary | ICD-10-CM | POA: Diagnosis not present

## 2012-06-08 DIAGNOSIS — K219 Gastro-esophageal reflux disease without esophagitis: Secondary | ICD-10-CM | POA: Diagnosis not present

## 2012-06-08 DIAGNOSIS — E119 Type 2 diabetes mellitus without complications: Secondary | ICD-10-CM

## 2012-06-08 DIAGNOSIS — R197 Diarrhea, unspecified: Secondary | ICD-10-CM | POA: Diagnosis not present

## 2012-06-08 LAB — GLUCOSE, CAPILLARY: Glucose-Capillary: 91 mg/dL (ref 70–99)

## 2012-06-08 LAB — COMPREHENSIVE METABOLIC PANEL
ALT: 27 U/L (ref 0–53)
Alkaline Phosphatase: 45 U/L (ref 39–117)
Chloride: 103 mEq/L (ref 96–112)
GFR calc Af Amer: 63 mL/min — ABNORMAL LOW (ref >90)
Glucose, Bld: 96 mg/dL (ref 70–99)
Potassium: 4.2 mEq/L (ref 3.5–5.1)
Sodium: 136 mEq/L (ref 135–145)
Total Bilirubin: 0.9 mg/dL (ref 0.3–1.2)
Total Protein: 5.6 g/dL — ABNORMAL LOW (ref 6.0–8.3)

## 2012-06-08 LAB — CBC
HCT: 35.5 % — ABNORMAL LOW (ref 39.0–52.0)
MCHC: 33 g/dL (ref 30.0–36.0)
MCV: 94.2 fL (ref 78.0–100.0)
Platelets: 169 10*3/uL (ref 150–400)
RDW: 14.2 % (ref 11.5–15.5)

## 2012-06-08 LAB — TROPONIN I: Troponin I: <0.3 ng/mL (ref <0.30)

## 2012-06-08 MED ORDER — WARFARIN SODIUM 2.5 MG PO TABS
2.5000 mg | ORAL_TABLET | Freq: Once | ORAL | Status: AC
  Administered 2012-06-08: 2.5 mg via ORAL
  Filled 2012-06-08: qty 1

## 2012-06-08 MED ORDER — METOPROLOL SUCCINATE ER 25 MG PO TB24
25.0000 mg | ORAL_TABLET | Freq: Every day | ORAL | Status: DC
Start: 2012-06-08 — End: 2012-06-10
  Administered 2012-06-08 – 2012-06-10 (×3): 25 mg via ORAL
  Filled 2012-06-08 (×3): qty 1

## 2012-06-08 NOTE — Progress Notes (Signed)
PT screen:  Spoke with OT who has visited with pt for eval and note that he is Independent with all mobility and has no PT needs at this time.  Will sign off.  Thanks,  Harriet Butte, PT [phone]

## 2012-06-08 NOTE — Progress Notes (Signed)
OT Cancellation Note  Patient Details Name: [name] MRN: [id number] DOB: 1940-06-08   Cancelled Treatment:    Reason Eval/Treat Not Completed:  Pt is independent in ADL.  Performed grooming and toileting.  Ambulated around room independently. Did not proceed with full eval.  Signing off.  Evern Bio 06/08/2012, 10:25 AM

## 2012-06-08 NOTE — Progress Notes (Signed)
2200 cbg was 113, taken by RN.  Glucometer failed to recognize pt.

## 2012-06-08 NOTE — Progress Notes (Signed)
[facility] PROGRESS NOTE  [name] ZOX:[id number] DOB: 09/15/1940 DOA: 06/07/2012 PCP: Romero Belling, MD  Assessment/Plan: #1 nausea vomiting diarrhea/abdominal pain   . May be secondary to a gastroenteritis versus secondary to chemotherapy versus abdominal pathology. Patient also having diarrhea and doubt if this necessarily gastroparesis. C. difficile PCR is pending. IV fluids, anti emetics and supportive therapy. Lipase levels within normal limits. abd films does not show any obstruction. No vomiting from admission. And one episode of loose BM after admission.     #2 dehydration  IV fluids.   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 We'll place on full dose Lovenox for now. Resume Coumadin. Once INR is greater than 2 will discontinue Lovenox.   #4 hypertension  Resume imdur and metoprolol. Hold lisinopril for dehydration.   #5 gastroesophageal reflux disease with stricture  PPI.   #6 myasthenia gravis  Continue home dose prednisone and Mestinon.   #7 hyperlipidemia  Hold statin for now.   #8 diabetes mellitus type 2  Check a hemoglobin A1c. Hold metformin. Place on sliding scale insulin.   #9 coronary artery disease  Stable. Cardiac enzymes negative.   No chest pain. Resume home medications.   #10 prophylaxis  PPI for GI prophylaxis. Lovenox for DVT prophylaxis.      Code Status:DNR Family Communication: none atbedside Disposition Plan: home when stable      HPI/Subjective: One loose BM last night. No other episodes of diarrhea.   Objective: Filed Vitals:   06/07/12 1533 06/07/12 2136 06/08/12 0600 06/08/12 0919  BP:  128/69 120/67 122/63  Pulse:  80 73 72  Temp:  98.6 F (37 C) 98.2 F (36.8 C) 97.7 F (36.5 C)  TempSrc:  Oral Oral Oral  Resp:  18 18 18   Height: 5' 6.93" (1.7 m)   5\' 4"  (1.626 m)  Weight:    81 kg (178 lb 9.2 oz)  SpO2:  95% 94% 99%    Intake/Output Summary (Last 24 hours) at 06/08/12  1001 Last data filed at 06/08/12 8119  Gross per 24 hour  Intake 1528.33 ml  Output    200 ml  Net 1328.33 ml   Filed Weights   06/07/12 1524 06/08/12 0919  Weight: 81.2 kg (179 lb 0.2 oz) 81 kg (178 lb 9.2 oz)    Exam:   alert afebrile comfortable.  Cardiovascular: Regular rate rhythm no murmurs rubs or gallops. No JVD. No lower extremity edema.  Respiratory: Clear to auscultation bilaterally. No crackles, no wheezing, no rhonchi.  Abdomen: Distended, tight, tender to palpation in the epigastrium, decreased bowel sounds.  Skin: redness on the left hand probably superficial thrombophlebitis.   Musculoskeletal: 5/5 BUE strength,5/5 BLE strength.   Data Reviewed: Basic Metabolic Panel:  Recent Labs Lab 06/07/12 0900 06/08/12 0345  NA 138 136  K 3.5 4.2  CL 99 103  CO2 27 24  GLUCOSE 121* 96  BUN 14 10  CREATININE 1.35 1.27  CALCIUM 9.5 8.2*  MG 2.1  --    Liver Function Tests:  Recent Labs Lab 06/07/12 0900 06/08/12 0345  AST 31 27  ALT 29 27  ALKPHOS 60 45  BILITOT 0.8 0.9  PROT 6.7 5.6*  ALBUMIN 4.1 3.2*    Recent Labs Lab 06/07/12 0900  LIPASE 38   No results found for this basename: AMMONIA,  in the last 168 hours CBC:  Recent Labs Lab 06/07/12 0900 06/08/12 0345  WBC 9.4 9.5  NEUTROABS 7.5  --  HGB 14.0 11.7*  HCT 40.9 35.5*  MCV 93.4 94.2  PLT 232 169   Cardiac Enzymes:  Recent Labs Lab 06/07/12 0900 06/07/12 1644 06/07/12 2150 06/08/12 0345  TROPONINI <0.30 <0.30 <0.30 <0.30   BNP (last 3 results) No results found for this basename: PROBNP,  in the last 8760 hours CBG:  Recent Labs Lab 06/07/12 1633 06/07/12 2226 06/08/12 0755  GLUCAP 104* 113* 85    No results found for this or any previous visit (from the past 240 hour(s)).   Studies: Dg Abd Acute W/chest  06/07/2012  *RADIOLOGY REPORT*  Clinical Data:  Nausea, vomiting, abdominal pain.  ACUTE ABDOMEN SERIES (ABDOMEN 2 VIEW & CHEST 1 VIEW)  Comparison:  04/09/2011  Findings: Heart is borderline in size.  Minimal lingular scarring or atelectasis.  Right lung is clear.  No effusions.  No evidence of bowel obstruction.  No free air.  No organomegaly or suspicious calcification.  Degenerative changes in the lumbar spine and hips.  IMPRESSION: No evidence of bowel obstruction or free air.  Mild cardiomegaly.  Lingular scarring or atelectasis.   Original Report Authenticated By: Charlett Nose, M.D.     Scheduled Meds: . enoxaparin (LOVENOX) injection  80 mg Subcutaneous Q12H  . insulin aspart  0-9 Units Subcutaneous TID WC  . isosorbide mononitrate  30 mg Oral Daily  . metoprolol  5 mg Intravenous Q8H  . ondansetron (ZOFRAN) IV  8 mg Intravenous Q8H  . pantoprazole (PROTONIX) IV  40 mg Intravenous Q24H  . predniSONE  5 mg Oral QODAY  . pyridostigmine  90 mg Oral Daily  . sodium chloride  3 mL Intravenous Q12H  . warfarin  2.5 mg Oral ONCE-1800  . Warfarin - Pharmacist Dosing Inpatient   Does not apply q1800   Continuous Infusions: . sodium chloride 1,000 mL (06/08/12 0334)    Principal Problem:   Nausea, vomiting and diarrhea Active Problems:   LEUKEMIA, MYELOID, CHRONIC   HYPERLIPIDEMIA, MIXED   HYPERTENSION   CORONARY ARTERY DISEASE   ABDOMINAL PAIN-EPIGASTRIC   Type II or unspecified type diabetes mellitus without mention of complication, not stated as uncontrolled   Pulmonary embolism   DVT of lower extremity (deep venous thrombosis)   Myasthenia gravis   GERD with stricture   Dehydration   Subtherapeutic international normalized ratio (INR)    Time spent: 30 minutes.     [facility]  [facility] Pager [phone]. If 7PM-7AM, please contact night-coverage at [email or web address], password [facility] 06/08/2012, 10:01 AM  LOS: 1 day

## 2012-06-08 NOTE — Progress Notes (Signed)
ANTICOAGULATION CONSULT NOTE - Follow Up Consult  Pharmacy Consult for Warfarin Indication: History of PE/DVT  Allergies  Allergen Reactions  . Oxycodone-Acetaminophen Other (See Comments)    Went "out of head"  . Sulfamethoxazole Rash    Patient Measurements: Height: 5\' 4"  (162.6 cm) Weight: 178 lb 9.2 oz (81 kg) IBW/kg (Calculated) : 59.2  Vital Signs: Temp: 97.7 F (36.5 C) (04/30 0919) Temp src: Oral (04/30 0919) BP: 122/63 mmHg (04/30 0919) Pulse Rate: 72 (04/30 0919)  Labs:  Recent Labs  06/07/12 0900 06/07/12 1644 06/07/12 2150 06/08/12 0345 06/08/12 0525  HGB 14.0  --   --  11.7*  --   HCT 40.9  --   --  35.5*  --   PLT 232  --   --  169  --   LABPROT 16.6*  --   --   --  19.9*  INR 1.38  --   --   --  1.76*  CREATININE 1.35  --   --  1.27  --   TROPONINI <0.30 <0.30 <0.30 <0.30  --     Estimated Creatinine Clearance: 49 ml/min (by C-G formula based on Cr of 1.27).   Medications:  Home dose Coumadin: 2.5mg  daily except 5mg  on Thursday  Assessment: 20 yoM on chronic coumadin for history of PE and DVT presents 4/29 with N/V/D and abdominal pain.  Coumadin resumed per pharmacy and lovenox bridge per MD.  INR subtherapeutic on admission and pt received boosted dose (coumadin 5mg ) last night.  Today INR responding appropriately, still subtherapeutic.   Renal fxn stable  Hgb small decrease, no bleeding noted/documented.     Diet: Clear liquids  No DDIs noted   Goal of Therapy:  INR 2-3 Monitor platelets by anticoagulation protocol: Yes   Plan:  1.  Coumadin 2.5mg  po x 1 tonight 2.  Daily PT/INR  Haynes Hoehn, PharmD 06/08/2012 9:36 AM  Pager: [phone]

## 2012-06-08 NOTE — Telephone Encounter (Signed)
Returned call, no answer. Will try later.  

## 2012-06-08 NOTE — Progress Notes (Signed)
Pt has mild R hand and R forearm edema, not redness or warmth noted.  IV infusing to R AC.  IV patent with excellent blood return and does not appear to be infiltrated.  No edema noted above the IV site.  IV saline locked. Dr. Blake Divine notified due to patient history of blood clots.  MD to assess.  No new orders given at this time.  [facility] RN notified.

## 2012-06-09 DIAGNOSIS — E119 Type 2 diabetes mellitus without complications: Secondary | ICD-10-CM | POA: Diagnosis not present

## 2012-06-09 DIAGNOSIS — C9210 Chronic myeloid leukemia, BCR/ABL-positive, not having achieved remission: Secondary | ICD-10-CM | POA: Diagnosis not present

## 2012-06-09 DIAGNOSIS — R609 Edema, unspecified: Secondary | ICD-10-CM | POA: Diagnosis not present

## 2012-06-09 DIAGNOSIS — Z7901 Long term (current) use of anticoagulants: Secondary | ICD-10-CM | POA: Diagnosis not present

## 2012-06-09 DIAGNOSIS — R197 Diarrhea, unspecified: Secondary | ICD-10-CM | POA: Diagnosis not present

## 2012-06-09 LAB — GLUCOSE, CAPILLARY
Glucose-Capillary: 106 mg/dL — ABNORMAL HIGH (ref 70–99)
Glucose-Capillary: 111 mg/dL — ABNORMAL HIGH (ref 70–99)
Glucose-Capillary: 130 mg/dL — ABNORMAL HIGH (ref 70–99)
Glucose-Capillary: 116 mg/dL — ABNORMAL HIGH (ref 70–99)

## 2012-06-09 LAB — URINE CULTURE: Colony Count: 60000

## 2012-06-09 LAB — PROTIME-INR
INR: 2.29 — ABNORMAL HIGH (ref 0.00–1.49)
Prothrombin Time: 24.2 seconds — ABNORMAL HIGH (ref 11.6–15.2)

## 2012-06-09 MED ORDER — WARFARIN SODIUM 2 MG PO TABS
2.0000 mg | ORAL_TABLET | Freq: Once | ORAL | Status: AC
  Administered 2012-06-09: 2 mg via ORAL
  Filled 2012-06-09 (×2): qty 1

## 2012-06-09 MED ORDER — CIPROFLOXACIN HCL 500 MG PO TABS
500.0000 mg | ORAL_TABLET | Freq: Two times a day (BID) | ORAL | Status: DC
  Filled 2012-06-09 (×3): qty 1

## 2012-06-09 MED ORDER — CIPROFLOXACIN HCL 500 MG PO TABS
500.0000 mg | ORAL_TABLET | Freq: Two times a day (BID) | ORAL | Status: DC
  Administered 2012-06-09 – 2012-06-10 (×3): 500 mg via ORAL
  Filled 2012-06-09: qty 1

## 2012-06-09 MED ORDER — CIPROFLOXACIN HCL 500 MG PO TABS: 500.0000 mg | ORAL_TABLET | Freq: Two times a day (BID) | ORAL | Status: DC

## 2012-06-09 NOTE — Progress Notes (Signed)
*  PRELIMINARY RESULTS* Vascular Ultrasound Right lower extremity venous duplex has been completed.  Preliminary findings: No evidence of deep or superficial thrombosis of the right upper extremity.   Farrel Demark, RDMS, RVT  06/09/2012, 11:27 AM

## 2012-06-09 NOTE — Progress Notes (Signed)
Pt states that he is not able to have a BM.  Prn med given with only small amt results.  Prune juice given.

## 2012-06-09 NOTE — Progress Notes (Signed)
ANTICOAGULATION CONSULT NOTE - Follow Up Consult  Pharmacy Consult for Warfarin Indication: History of PE/DVT  Allergies  Allergen Reactions  . Oxycodone-Acetaminophen Other (See Comments)    Went "out of head"  . Sulfamethoxazole Rash    Patient Measurements: Height: 5\' 4"  (162.6 cm) Weight: 183 lb 6.4 oz (83.19 kg) IBW/kg (Calculated) : 59.2  Vital Signs: Temp: 98.1 F (36.7 C) (05/01 0600) Temp src: Oral (05/01 0600) BP: 126/70 mmHg (05/01 0600) Pulse Rate: 64 (05/01 0600)  Labs:  Recent Labs  06/07/12 0900 06/07/12 1644 06/07/12 2150 06/08/12 0345 06/08/12 0525 06/09/12 0530  HGB 14.0  --   --  11.7*  --   --   HCT 40.9  --   --  35.5*  --   --   PLT 232  --   --  169  --   --   LABPROT 16.6*  --   --   --  19.9* 24.2*  INR 1.38  --   --   --  1.76* 2.29*  CREATININE 1.35  --   --  1.27  --   --   TROPONINI <0.30 <0.30 <0.30 <0.30  --   --     Estimated Creatinine Clearance: 49.7 ml/min (by C-G formula based on Cr of 1.27).   Medications:  Home dose Coumadin: 2.5mg  daily except 5mg  on Thursday  Assessment: 72 yoM on chronic coumadin for history of PE and DVT presents 4/29 with N/V/D and abdominal pain.  Coumadin resumed per pharmacy and lovenox bridge per MD while INR < 2.0.  INR subtherapeutic on admission 4/29 and pt received boosted dose (coumadin 5mg ) that evening.  Today's INR now therapeutic.  CrCl~49 ml/min, renal fxn stable  No bleeding noted/documented.     Diet: regular  No DDIs noted   Goal of Therapy:  INR 2-3 Monitor platelets by anticoagulation protocol: Yes   Plan:  1.  Coumadin 2 mg po x 1 tonight 2.  Daily PT/INR 3.  D/c Lovenox since INR>2 per MD note  Clance Boll, PharmD, BCPS Pager: [phone] 06/09/2012 7:53 AM

## 2012-06-09 NOTE — Progress Notes (Signed)
[facility] PROGRESS NOTE  [name] UJW:[id number] DOB: 1941/02/01 DOA: 06/07/2012 PCP: Romero Belling, MD  Assessment/Plan: #1 nausea vomiting diarrhea/abdominal pain: resolved.    . May be secondary to a gastroenteritis versus secondary to chemotherapy versus abdominal pathology. Patient also having diarrhea and doubt if this necessarily gastroparesis.  IV fluids, anti emetics and supportive therapy. Lipase levels within normal limits. abd films does not show any obstruction. No vomiting from admission. And one episode of loose BM after admission.     #2 dehydration  IV fluids.   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  Resume Coumadin. Once INR is greater than 2 will discontinue Lovenox.   #4 hypertension  Resume imdur and metoprolol. Hold lisinopril for dehydration.   #5 gastroesophageal reflux disease with stricture  PPI.   #6 myasthenia gravis  Continue home dose prednisone and Mestinon.   #7 hyperlipidemia  Hold statin for now.   #8 diabetes mellitus type 2  Check a hemoglobin A1c. Hold metformin. Place on sliding scale insulin.   #9 coronary artery disease  Stable. Cardiac enzymes negative.   No chest pain. Resume home medications.   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 Lovenox for DVT prophylaxis.      Code Status:DNR Family Communication: none atbedside Disposition Plan: home when stable      HPI/Subjective: One loose BM last night. No other episodes of diarrhea.   Objective: Filed Vitals:   06/08/12 0919 06/08/12 1324 06/08/12 2200 06/09/12 0600  BP: 122/63 112/62 126/78 126/70  Pulse: 72 57 66 64  Temp: 97.7 F (36.5 C) 98.4 F (36.9 C) 98.7 F (37.1 C) 98.1 F (36.7 C)  TempSrc: Oral Oral Oral Oral  Resp: 18 18 18 18   Height: 5\' 4"  (1.626 m)     Weight: 81 kg (178 lb 9.2 oz)   83.19 kg (183 lb 6.4 oz)  SpO2: 99% 95% 96% 95%    Intake/Output Summary (Last 24 hours) at  06/09/12 1248 Last data filed at 06/09/12 0751  Gross per 24 hour  Intake   2460 ml  Output      0 ml  Net   2460 ml   Filed Weights   06/07/12 1524 06/08/12 0919 06/09/12 0600  Weight: 81.2 kg (179 lb 0.2 oz) 81 kg (178 lb 9.2 oz) 83.19 kg (183 lb 6.4 oz)    Exam:   alert afebrile comfortable.  Cardiovascular: Regular rate rhythm no murmurs rubs or gallops. No JVD. No lower extremity edema.  Respiratory: Clear to auscultation bilaterally. No crackles, no wheezing, no rhonchi.  Abdomen: Distended, tight, tender to palpation in the epigastrium, decreased bowel sounds.  Skin: redness on the left hand probably superficial thrombophlebitis.   Musculoskeletal: 5/5 BUE strength,5/5 BLE strength.   Data Reviewed: Basic Metabolic Panel:  Recent Labs Lab 06/07/12 0900 06/08/12 0345  NA 138 136  K 3.5 4.2  CL 99 103  CO2 27 24  GLUCOSE 121* 96  BUN 14 10  CREATININE 1.35 1.27  CALCIUM 9.5 8.2*  MG 2.1  --    Liver Function Tests:  Recent Labs Lab 06/07/12 0900 06/08/12 0345  AST 31 27  ALT 29 27  ALKPHOS 60 45  BILITOT 0.8 0.9  PROT 6.7 5.6*  ALBUMIN 4.1 3.2*    Recent Labs Lab 06/07/12 0900  LIPASE 38   No results found for this basename: AMMONIA,  in the last 168 hours CBC:  Recent Labs Lab 06/07/12 0900 06/08/12 0345  WBC 9.4 9.5  NEUTROABS 7.5  --   HGB 14.0 11.7*  HCT 40.9 35.5*  MCV 93.4 94.2  PLT 232 169   Cardiac Enzymes:  Recent Labs Lab 06/07/12 0900 06/07/12 1644 06/07/12 2150 06/08/12 0345  TROPONINI <0.30 <0.30 <0.30 <0.30   BNP (last 3 results) No results found for this basename: PROBNP,  in the last 8760 hours CBG:  Recent Labs Lab 06/08/12 1225 06/08/12 1700 06/08/12 2055 06/09/12 0750 06/09/12 1146  GLUCAP 122* 91 121* 106* 111*    Recent Results (from the past 240 hour(s))  URINE CULTURE     Status: None   Collection Time    06/07/12  4:26 PM      Result Value Range Status   Specimen Description URINE, RANDOM    Final   Special Requests A   Final   Culture  Setup Time 06/07/2012 22:38   Final   Colony Count 60,000 COLONIES/ML   Final   Culture KLEBSIELLA PNEUMONIAE   Final   Report Status 06/09/2012 FINAL   Final   Organism ID, Bacteria KLEBSIELLA PNEUMONIAE   Final     Studies: Dg Abd Acute W/chest  06/07/2012  *RADIOLOGY REPORT*  Clinical Data:  Nausea, vomiting, abdominal pain.  ACUTE ABDOMEN SERIES (ABDOMEN 2 VIEW & CHEST 1 VIEW)  Comparison: 04/09/2011  Findings: Heart is borderline in size.  Minimal lingular scarring or atelectasis.  Right lung is clear.  No effusions.  No evidence of bowel obstruction.  No free air.  No organomegaly or suspicious calcification.  Degenerative changes in the lumbar spine and hips.  IMPRESSION: No evidence of bowel obstruction or free air.  Mild cardiomegaly.  Lingular scarring or atelectasis.   Original Report Authenticated By: Charlett Nose, M.D.     Scheduled Meds: . ciprofloxacin  500 mg Oral BID  . insulin aspart  0-9 Units Subcutaneous TID WC  . isosorbide mononitrate  30 mg Oral Daily  . metoprolol succinate  25 mg Oral Daily  . pantoprazole (PROTONIX) IV  40 mg Intravenous Q24H  . predniSONE  5 mg Oral QODAY  . pyridostigmine  90 mg Oral Daily  . sodium chloride  3 mL Intravenous Q12H  . warfarin  2 mg Oral ONCE-1800  . Warfarin - Pharmacist Dosing Inpatient   Does not apply q1800   Continuous Infusions:    Principal Problem:   Nausea, vomiting and diarrhea Active Problems:   LEUKEMIA, MYELOID, CHRONIC   HYPERLIPIDEMIA, MIXED   HYPERTENSION   CORONARY ARTERY DISEASE   ABDOMINAL PAIN-EPIGASTRIC   Type II or unspecified type diabetes mellitus without mention of complication, not stated as uncontrolled   Pulmonary embolism   DVT of lower extremity (deep venous thrombosis)   Myasthenia gravis   GERD with stricture   Dehydration   Subtherapeutic international normalized ratio (INR)    Time spent: 30 minutes.     [facility]  [facility] Pager [phone]. If 7PM-7AM, please contact night-coverage at [email or web address], password [facility] 06/09/2012, 12:48 PM  LOS: 2 days

## 2012-06-10 DIAGNOSIS — E860 Dehydration: Secondary | ICD-10-CM | POA: Diagnosis not present

## 2012-06-10 DIAGNOSIS — C9210 Chronic myeloid leukemia, BCR/ABL-positive, not having achieved remission: Secondary | ICD-10-CM | POA: Diagnosis not present

## 2012-06-10 DIAGNOSIS — E119 Type 2 diabetes mellitus without complications: Secondary | ICD-10-CM | POA: Diagnosis not present

## 2012-06-10 DIAGNOSIS — R197 Diarrhea, unspecified: Secondary | ICD-10-CM | POA: Diagnosis not present

## 2012-06-10 MED ORDER — PROMETHAZINE HCL 12.5 MG PO TABS: 12.5000 mg | ORAL_TABLET | Freq: Four times a day (QID) | ORAL | Status: DC | PRN

## 2012-06-10 MED ORDER — IMATINIB MESYLATE 400 MG PO TABS: 400.0000 mg | ORAL_TABLET | Freq: Every day | ORAL | Status: DC

## 2012-06-10 MED ORDER — LORAZEPAM 0.5 MG PO TABS: 0.5000 mg | ORAL_TABLET | Freq: Every evening | ORAL | Status: DC | PRN

## 2012-06-10 NOTE — Discharge Summary (Signed)
Physician Discharge Summary  [name] ZOX:[id number] DOB: Aug 15, 1940 DOA: 06/07/2012  PCP: Romero Belling, MD  Admit date: 06/07/2012 Discharge date: 06/10/2012  Time spent: 35 minutes  Recommendations for Outpatient Follow-up:  1. Follow up with Dr Arlyce Dice in one week 2. Resume gleevac  Discharge Diagnoses:  Principal Problem:   Nausea, vomiting and diarrhea Active Problems:   LEUKEMIA, MYELOID, CHRONIC   HYPERLIPIDEMIA, MIXED   HYPERTENSION   CORONARY ARTERY DISEASE   ABDOMINAL PAIN-EPIGASTRIC   Type II or unspecified type diabetes mellitus without mention of complication, not stated as uncontrolled   Pulmonary embolism   DVT of lower extremity (deep venous thrombosis)   Myasthenia gravis   GERD with stricture   Dehydration   Subtherapeutic international normalized ratio (INR)   Discharge Condition: fair  Diet recommendation: low sodium diet   Filed Weights   06/07/12 1524 06/08/12 0919 06/09/12 0600  Weight: 81.2 kg (179 lb 0.2 oz) 81 kg (178 lb 9.2 oz) 83.19 kg (183 lb 6.4 oz)    History of present illness:   [name] is a 72 y.o. 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 Patient stated he tried some Imodium with no help. Patient stated that he had been on Reglan which was subsequently discontinued 3 days prior to admission. Patient stated he had about 7-8 loose watery bowel stools daily. Patient denies any chest pain, no shortness of breath, no fever, no chills, no dysuria, no cough, no hematemesis, no hematochezia, no melena. Patient does endorse some generalized weakness as well. Patient was seen in the ED was noted to be dehydrated given a liter bolus. Patient also given  some Zofran. Patient with no significant improvement and a such will call to admit the patient for further evaluation and management.   Hospital Course:    #1 nausea vomiting diarrhea/abdominal pain: resolved.  . May be secondary to a gastroenteritis versus secondary to chemotherapy  Patient also having diarrhea and doubt if this necessarily gastroparesis. IV fluids, anti emetics and supportive therapy. Lipase levels within normal limits. abd films does not show any obstruction. No vomiting from admission.  His stools were normal yesterday and we will discharge him today and recommend follow up with PCP in one week and follow up with Dr Arlyce Dice on 5/8. #2 dehydration  Resolved. Probably from diarrhea and vomiting.   #3 history of PE/left lower extremity DVT  INR is sub therapeutic. Resume Coumadin.   #4 hypertension  Resume imdur and metoprolol and lisinopril.   #5 gastroesophageal reflux disease with stricture  PPI.   #6 myasthenia gravis  Continue home dose prednisone and Mestinon.  #7 hyperlipidemia  Resume statin on discharge.  #8 diabetes mellitus type 2  Resume metformin on discharge.   #9 coronary artery disease  Stable. Cardiac enzymes negative.  No chest pain. Resume home medications.   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  Cardiovascular: Regular rate rhythm no murmurs rubs or gallops. No JVD. No lower extremity edema.  Respiratory: Clear to auscultation bilaterally. No crackles, no wheezing, no rhonchi.  Abdomen: Distended, tight, tender to palpation in the epigastrium,  decreased bowel sounds.  Skin: redness on the left hand probably superficial thrombophlebitis.  Musculoskeletal: 5/5 BUE strength,5/5 BLE strength.   Discharge Instructions  Discharge Orders   Future Appointments Provider Department Dept Phone   06/16/2012 10:15 AM Louis Meckel, MD [facility] Gastro[facility]ology [phone]   09/20/2012 11:00 AM Dava Najjar [facility] MED[facility] ONCOL[facility] [phone]   09/20/2012 11:30 AM Ladene Artist, MD [facility] MEDICAL ONCOLOGY [phone]   10/04/2012 2:30 PM Tonny Bollman, MD [facility] Main Office [place]) [phone]   11/24/2012 2:30 PM York Spaniel, MD [facility] [phone]   Future Orders Complete By Expires     Discharge instructions  As directed     Comments:      Follow up with Dr Myrle Sheng as recommended.        Medication List    TAKE these medications       ciprofloxacin 500 MG tablet  Commonly known as:  CIPRO  Take 1 tablet (500 mg total) by mouth 2 (two) times daily.     esomeprazole 40 MG capsule  Commonly known as:  NEXIUM  Take 40 mg by mouth daily before breakfast.     imatinib 400 MG tablet  Commonly known as:  GLEEVEC  Take 400 mg by mouth daily.     isosorbide mononitrate 30 MG 24 hr tablet  Commonly known as:  IMDUR  Take 30 mg by mouth daily.     lisinopril 5 MG tablet  Commonly known as:  PRINIVIL,ZESTRIL  Take 1 tablet (5 mg total) by mouth daily.     loperamide 2 MG capsule  Commonly known as:  IMODIUM  Take 2 mg by mouth 4 (four) times daily as needed. For diarrhea     LORazepam 0.5 MG tablet  Commonly known as:  ATIVAN  Take 0.5 mg by mouth at bedtime as needed. For sleep.     metFORMIN 500 MG 24 hr tablet  Commonly known as:  GLUCOPHAGE-XR  TAKE 1 TABLET BY MOUTH EVERY DAY IN THE MORNING     metoprolol succinate 25 MG 24 hr tablet  Commonly known as:  TOPROL-XL  Take 1 tablet (25 mg total) by mouth daily.      OCUVITE PO  Take 1 tablet by mouth daily.     ondansetron 4 MG tablet  Commonly known as:  ZOFRAN  Take 4 mg by mouth every 6 (six) hours as needed. For nausea.     predniSONE 5 MG tablet  Commonly known as:  DELTASONE  Take 5 mg by mouth every other day.     pyridostigmine 60 MG tablet  Commonly known as:  MESTINON  Take 1.5 tablets (90 mg total) by mouth daily.     rosuvastatin 10 MG tablet  Commonly known as:  CRESTOR  Take 1 tablet (10 mg total) by mouth daily.     traMADol 50 MG tablet  Commonly known as:  ULTRAM  Take 50 mg by mouth every 4 (four) hours as needed. For pain     warfarin 5 MG tablet  Commonly known as:  COUMADIN  Take  2.5-5 mg by mouth See admin instructions. 2.5 mg Monday Tuesday Wednesday Friday Saturday Sunday, 5 mg on Thursday       Allergies  Allergen Reactions  . Oxycodone-Acetaminophen Other (See Comments)    Went "out of head"  . Sulfamethoxazole Rash      The results of significant diagnostics from this hospitalization (including imaging, microbiology, ancillary and laboratory) are listed below for reference.    Significant Diagnostic Studies: Dg Abd Acute W/chest  06/07/2012  *RADIOLOGY REPORT*  Clinical Data:  Nausea, vomiting, abdominal pain.  ACUTE ABDOMEN SERIES (ABDOMEN 2 VIEW & CHEST 1 VIEW)  Comparison: 04/09/2011  Findings: Heart is borderline in size.  Minimal lingular scarring or atelectasis.  Right lung is clear.  No effusions.  No evidence of bowel obstruction.  No free air.  No organomegaly or suspicious calcification.  Degenerative changes in the lumbar spine and hips.  IMPRESSION: No evidence of bowel obstruction or free air.  Mild cardiomegaly.  Lingular scarring or atelectasis.   Original Report Authenticated By: Charlett Nose, M.D.     Microbiology: Recent Results (from the past 240 hour(s))  URINE CULTURE     Status: None   Collection Time    06/07/12  4:26 PM      Result Value Range Status   Specimen Description  URINE, RANDOM   Final   Special Requests A   Final   Culture  Setup Time 06/07/2012 22:38   Final   Colony Count 60,000 COLONIES/ML   Final   Culture KLEBSIELLA PNEUMONIAE   Final   Report Status 06/09/2012 FINAL   Final   Organism ID, Bacteria KLEBSIELLA PNEUMONIAE   Final     Labs: Basic Metabolic Panel:  Recent Labs Lab 06/07/12 0900 06/08/12 0345  NA 138 136  K 3.5 4.2  CL 99 103  CO2 27 24  GLUCOSE 121* 96  BUN 14 10  CREATININE 1.35 1.27  CALCIUM 9.5 8.2*  MG 2.1  --    Liver Function Tests:  Recent Labs Lab 06/07/12 0900 06/08/12 0345  AST 31 27  ALT 29 27  ALKPHOS 60 45  BILITOT 0.8 0.9  PROT 6.7 5.6*  ALBUMIN 4.1 3.2*    Recent Labs Lab 06/07/12 0900  LIPASE 38   No results found for this basename: AMMONIA,  in the last 168 hours CBC:  Recent Labs Lab 06/07/12 0900 06/08/12 0345  WBC 9.4 9.5  NEUTROABS 7.5  --   HGB 14.0 11.7*  HCT 40.9 35.5*  MCV 93.4 94.2  PLT 232 169   Cardiac Enzymes:  Recent Labs Lab 06/07/12 0900 06/07/12 1644 06/07/12 2150 06/08/12 0345  TROPONINI <0.30 <0.30 <0.30 <0.30   BNP: BNP (last 3 results) No results found for this basename: PROBNP,  in the last 8760 hours CBG:  Recent Labs Lab 06/09/12 0750 06/09/12 1146 06/09/12 1622 06/09/12 2159 06/10/12 0732  GLUCAP 106* 111* 130* 116* 122*       Signed:  Blessings Inglett  [facility] 06/10/2012, 9:55 AM

## 2012-06-10 NOTE — Progress Notes (Addendum)
Pt had 2 BMs last night. BMs were loose. Pt was given PRN medication to have BM on first shift (5/1)

## 2012-06-10 NOTE — Progress Notes (Signed)
Discharge instructions given to pt. Verbalized understanding. Left the unit in stable condition.

## 2012-06-12 DIAGNOSIS — R509 Fever, unspecified: Secondary | ICD-10-CM | POA: Diagnosis not present

## 2012-06-16 ENCOUNTER — Ambulatory Visit (INDEPENDENT_AMBULATORY_CARE_PROVIDER_SITE_OTHER): Payer: Medicare Other | Admitting: Gastroenterology

## 2012-06-16 ENCOUNTER — Encounter: Payer: Self-pay | Admitting: Gastroenterology

## 2012-06-16 ENCOUNTER — Ambulatory Visit (INDEPENDENT_AMBULATORY_CARE_PROVIDER_SITE_OTHER): Payer: Medicare Other | Admitting: *Deleted

## 2012-06-16 VITALS — BP 108/66 | HR 72 | Ht 67.0 in | Wt 179.4 lb

## 2012-06-16 DIAGNOSIS — I82409 Acute embolism and thrombosis of unspecified deep veins of unspecified lower extremity: Secondary | ICD-10-CM

## 2012-06-16 DIAGNOSIS — I2699 Other pulmonary embolism without acute cor pulmonale: Secondary | ICD-10-CM

## 2012-06-16 DIAGNOSIS — R112 Nausea with vomiting, unspecified: Secondary | ICD-10-CM | POA: Diagnosis not present

## 2012-06-16 DIAGNOSIS — I824Z9 Acute embolism and thrombosis of unspecified deep veins of unspecified distal lower extremity: Secondary | ICD-10-CM | POA: Diagnosis not present

## 2012-06-16 DIAGNOSIS — Z7901 Long term (current) use of anticoagulants: Secondary | ICD-10-CM | POA: Diagnosis not present

## 2012-06-16 DIAGNOSIS — R109 Unspecified abdominal pain: Secondary | ICD-10-CM | POA: Diagnosis not present

## 2012-06-16 DIAGNOSIS — R197 Diarrhea, unspecified: Secondary | ICD-10-CM | POA: Diagnosis not present

## 2012-06-16 NOTE — Patient Instructions (Addendum)
Follow-up in 4 weeks

## 2012-06-16 NOTE — Progress Notes (Signed)
History of Present Illness:  [name] has returned for followup of diarrhea. Xifixan did not improve diarrhea.  Last week he was hospitalized for several days for nausea, vomiting and dehydration felt secondary to a gastroenteritis. Since then he is back to baseline having several loose stools daily. He denies rectal bleeding. Colonoscopy was 2010 but results are not available.    Review of Systems: Pertinent positive and negative review of systems were noted in the above HPI section. All other review of systems were otherwise negative.    Current Medications, Allergies, Past Medical History, Past Surgical History, Family History and Social History were reviewed in [employer] electronic medical record  Vital signs were reviewed in today's medical record. Physical Exam: General: Well developed , well nourished, no acute distress

## 2012-06-16 NOTE — Assessment & Plan Note (Signed)
Persistent diarrhea without response to antibiotics. I suspect that symptoms are related to gleevac.  Recommendations #1 the patient was instructed to take Imodium 2 tabs every 6 hours as needed; if not improved I will add Questran

## 2012-06-17 ENCOUNTER — Other Ambulatory Visit: Payer: Self-pay | Admitting: Neurology

## 2012-06-17 DIAGNOSIS — M4800 Spinal stenosis, site unspecified: Secondary | ICD-10-CM

## 2012-06-19 ENCOUNTER — Encounter: Payer: Self-pay | Admitting: Gastroenterology

## 2012-06-20 ENCOUNTER — Ambulatory Visit: Payer: Self-pay | Admitting: Neurology

## 2012-06-20 ENCOUNTER — Other Ambulatory Visit: Payer: Self-pay | Admitting: Gastroenterology

## 2012-06-20 MED ORDER — NYSTATIN-TRIAMCINOLONE 100000-0.1 UNIT/GM-% EX OINT: TOPICAL_OINTMENT | Freq: Two times a day (BID) | CUTANEOUS | Status: DC

## 2012-06-20 MED ORDER — NYSTATIN 100000 UNIT/ML MT SUSP: 500000.0000 [IU] | Freq: Four times a day (QID) | OROMUCOSAL | Status: DC

## 2012-06-22 DIAGNOSIS — K591 Functional diarrhea: Secondary | ICD-10-CM | POA: Diagnosis not present

## 2012-06-22 DIAGNOSIS — B356 Tinea cruris: Secondary | ICD-10-CM | POA: Diagnosis not present

## 2012-06-24 ENCOUNTER — Ambulatory Visit
Admission: RE | Admit: 2012-06-24 | Discharge: 2012-06-24 | Disposition: A | Discharge status: Home / Self Care | Payer: Medicare Other | Source: Ambulatory Visit | Attending: Neurology | Admitting: Neurology

## 2012-06-24 ENCOUNTER — Ambulatory Visit (INDEPENDENT_AMBULATORY_CARE_PROVIDER_SITE_OTHER): Payer: Medicare Other | Admitting: *Deleted

## 2012-06-24 VITALS — BP 124/69 | HR 61

## 2012-06-24 DIAGNOSIS — M4800 Spinal stenosis, site unspecified: Secondary | ICD-10-CM

## 2012-06-24 DIAGNOSIS — I82409 Acute embolism and thrombosis of unspecified deep veins of unspecified lower extremity: Secondary | ICD-10-CM

## 2012-06-24 DIAGNOSIS — M47817 Spondylosis without myelopathy or radiculopathy, lumbosacral region: Secondary | ICD-10-CM | POA: Diagnosis not present

## 2012-06-24 DIAGNOSIS — M5126 Other intervertebral disc displacement, lumbar region: Secondary | ICD-10-CM | POA: Diagnosis not present

## 2012-06-24 DIAGNOSIS — I2699 Other pulmonary embolism without acute cor pulmonale: Secondary | ICD-10-CM | POA: Diagnosis not present

## 2012-06-24 DIAGNOSIS — I824Z9 Acute embolism and thrombosis of unspecified deep veins of unspecified distal lower extremity: Secondary | ICD-10-CM | POA: Diagnosis not present

## 2012-06-24 DIAGNOSIS — Z7901 Long term (current) use of anticoagulants: Secondary | ICD-10-CM

## 2012-06-24 MED ORDER — IOHEXOL 180 MG/ML  SOLN
1.0000 mL | Freq: Once | INTRAMUSCULAR | Status: AC | PRN
  Administered 2012-06-24: 1 mL via EPIDURAL

## 2012-06-24 MED ORDER — METHYLPREDNISOLONE ACETATE 40 MG/ML INJ SUSP (RADIOLOG
120.0000 mg | Freq: Once | INTRAMUSCULAR | Status: AC
  Administered 2012-06-24: 120 mg via EPIDURAL

## 2012-07-06 ENCOUNTER — Ambulatory Visit (INDEPENDENT_AMBULATORY_CARE_PROVIDER_SITE_OTHER): Payer: Medicare Other | Admitting: Pharmacist

## 2012-07-06 DIAGNOSIS — I82409 Acute embolism and thrombosis of unspecified deep veins of unspecified lower extremity: Secondary | ICD-10-CM | POA: Diagnosis not present

## 2012-07-06 DIAGNOSIS — Z7901 Long term (current) use of anticoagulants: Secondary | ICD-10-CM

## 2012-07-06 DIAGNOSIS — I2699 Other pulmonary embolism without acute cor pulmonale: Secondary | ICD-10-CM | POA: Diagnosis not present

## 2012-07-20 ENCOUNTER — Ambulatory Visit (INDEPENDENT_AMBULATORY_CARE_PROVIDER_SITE_OTHER): Payer: Medicare Other | Admitting: Pharmacist

## 2012-07-20 ENCOUNTER — Ambulatory Visit (INDEPENDENT_AMBULATORY_CARE_PROVIDER_SITE_OTHER): Payer: Medicare Other | Admitting: Gastroenterology

## 2012-07-20 ENCOUNTER — Encounter: Payer: Self-pay | Admitting: Gastroenterology

## 2012-07-20 VITALS — BP 120/62 | HR 76 | Ht 67.0 in | Wt 176.8 lb

## 2012-07-20 DIAGNOSIS — I2699 Other pulmonary embolism without acute cor pulmonale: Secondary | ICD-10-CM

## 2012-07-20 DIAGNOSIS — R197 Diarrhea, unspecified: Secondary | ICD-10-CM

## 2012-07-20 DIAGNOSIS — I82409 Acute embolism and thrombosis of unspecified deep veins of unspecified lower extremity: Secondary | ICD-10-CM

## 2012-07-20 DIAGNOSIS — Z7901 Long term (current) use of anticoagulants: Secondary | ICD-10-CM | POA: Diagnosis not present

## 2012-07-20 NOTE — Assessment & Plan Note (Signed)
Excellent response to regimen of Imodium one tab every morning.

## 2012-07-20 NOTE — Progress Notes (Signed)
History of Present Illness:  [name] has returned for followup of diarrhea. On a regimen of Imodium one tablet every morning symptoms are now well-controlled. He has 1-2 bowel movements a day without urgency. Altogether he is feeling well.    Review of Systems: Pertinent positive and negative review of systems were noted in the above HPI section. All other review of systems were otherwise negative.    Current Medications, Allergies, Past Medical History, Past Surgical History, Family History and Social History were reviewed in [employer] electronic medical record  Vital signs were reviewed in today's medical record. Physical Exam: General: Well developed , well nourished, no acute distress

## 2012-07-20 NOTE — Patient Instructions (Addendum)
Follow up as needed

## 2012-07-29 DIAGNOSIS — G7000 Myasthenia gravis without (acute) exacerbation: Secondary | ICD-10-CM | POA: Diagnosis not present

## 2012-07-29 DIAGNOSIS — Z7901 Long term (current) use of anticoagulants: Secondary | ICD-10-CM | POA: Diagnosis not present

## 2012-07-29 DIAGNOSIS — C9211 Chronic myeloid leukemia, BCR/ABL-positive, in remission: Secondary | ICD-10-CM | POA: Diagnosis not present

## 2012-07-29 DIAGNOSIS — Z86711 Personal history of pulmonary embolism: Secondary | ICD-10-CM | POA: Diagnosis not present

## 2012-07-29 DIAGNOSIS — D539 Nutritional anemia, unspecified: Secondary | ICD-10-CM | POA: Diagnosis not present

## 2012-08-04 ENCOUNTER — Telehealth: Payer: Self-pay

## 2012-08-04 DIAGNOSIS — M4807 Spinal stenosis, lumbosacral region: Secondary | ICD-10-CM

## 2012-08-04 NOTE — Telephone Encounter (Signed)
Patient has requested that I refer his concerns for Dr. Anne Hahn' feedback. He has had back problems x 50 yrs. It has been really bad over the past yr. He has had back and neck surgery and his last surgery was in 1994. A doctor had told him he had hip problems. He started seeing Dr Sandria Manly who sent him for an mri that showed protruding/rupture L4-L5 and L5-S1. Patient still has pain. He can't ride [employer] or play golf. Went to PT and Chiropractor who both said they can't help. He thinks he may be past treatment. He went to [employer]" (Fax: [phone]). They said they could help and would need a referral. Patient wants Dr Anne Hahn' feedback. If okay then please fax referral to [employer] and insurance company. The best number to call patient is cell phone: [phone]  I let patient know that Dr. Anne Hahn is out of office until next week. When he returns he will have a multitude of messages to answer. Please be patient and Dr. Anne Hahn will respond or have one of his staff respond to your inquiry. Patient stated that he understood.

## 2012-08-04 NOTE — Telephone Encounter (Signed)
Message copied by [facility] on Thu Aug 04, 2012  4:17 PM ------      Message from: Arther Abbott B      Created: Thu Aug 04, 2012  3:37 PM      Contact: Pt [name]       Pt called needs someone to call him back about surgery or other options for his back. Please call his cell phone at [phone]. Thank s ------

## 2012-08-07 NOTE — Telephone Encounter (Signed)
I called patient and I left a message. The patient apparently did not get much benefit from the epidural steroid injections for his back. I have no problem with the physical therapy referral. I'll try to get this set up.

## 2012-08-09 ENCOUNTER — Ambulatory Visit (INDEPENDENT_AMBULATORY_CARE_PROVIDER_SITE_OTHER): Payer: Medicare Other | Admitting: *Deleted

## 2012-08-09 DIAGNOSIS — Z7901 Long term (current) use of anticoagulants: Secondary | ICD-10-CM | POA: Diagnosis not present

## 2012-08-09 DIAGNOSIS — I2699 Other pulmonary embolism without acute cor pulmonale: Secondary | ICD-10-CM

## 2012-08-09 LAB — POCT INR: INR: 2.3

## 2012-08-10 DIAGNOSIS — M545 Low back pain: Secondary | ICD-10-CM | POA: Diagnosis not present

## 2012-08-11 DIAGNOSIS — M545 Low back pain: Secondary | ICD-10-CM | POA: Diagnosis not present

## 2012-08-15 DIAGNOSIS — M545 Low back pain: Secondary | ICD-10-CM | POA: Diagnosis not present

## 2012-08-22 ENCOUNTER — Telehealth: Payer: Self-pay | Admitting: Neurology

## 2012-08-23 NOTE — Telephone Encounter (Signed)
Called the patient left voicemail asking him to give me a call back.

## 2012-08-25 ENCOUNTER — Telehealth: Payer: Self-pay | Admitting: Neurology

## 2012-08-30 ENCOUNTER — Other Ambulatory Visit: Payer: Self-pay | Admitting: *Deleted

## 2012-08-30 MED ORDER — METFORMIN HCL ER 500 MG PO TB24: ORAL_TABLET | ORAL | Status: DC

## 2012-08-30 MED ORDER — ISOSORBIDE MONONITRATE ER 30 MG PO TB24: 30.0000 mg | ORAL_TABLET | Freq: Every day | ORAL | Status: DC

## 2012-08-31 ENCOUNTER — Telehealth: Payer: Self-pay | Admitting: Neurology

## 2012-08-31 DIAGNOSIS — M5431 Sciatica, right side: Secondary | ICD-10-CM

## 2012-08-31 NOTE — Telephone Encounter (Signed)
I called patient. The patient has undergone medication therapy, epidural injections, physical therapy, and chiropractic treatments without benefit. 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 The patient is having ongoing discomfort, and right leg pain. I'll refer the patient to Dr. Marikay Alar.

## 2012-08-31 NOTE — Telephone Encounter (Signed)
I called and spoke with patient and he stated that he has went to PT and was told they couldn't help him and he seen a chiropractor and he couldn't help him. Patient would like to know what's the next step because he's in pain.

## 2012-09-06 ENCOUNTER — Ambulatory Visit (INDEPENDENT_AMBULATORY_CARE_PROVIDER_SITE_OTHER): Payer: Medicare Other | Admitting: *Deleted

## 2012-09-06 DIAGNOSIS — I2699 Other pulmonary embolism without acute cor pulmonale: Secondary | ICD-10-CM | POA: Diagnosis not present

## 2012-09-06 DIAGNOSIS — M48061 Spinal stenosis, lumbar region without neurogenic claudication: Secondary | ICD-10-CM | POA: Diagnosis not present

## 2012-09-06 DIAGNOSIS — IMO0002 Reserved for concepts with insufficient information to code with codable children: Secondary | ICD-10-CM | POA: Diagnosis not present

## 2012-09-06 DIAGNOSIS — Q762 Congenital spondylolisthesis: Secondary | ICD-10-CM | POA: Diagnosis not present

## 2012-09-06 DIAGNOSIS — E663 Overweight: Secondary | ICD-10-CM | POA: Diagnosis not present

## 2012-09-06 DIAGNOSIS — Z7901 Long term (current) use of anticoagulants: Secondary | ICD-10-CM

## 2012-09-06 LAB — POCT INR: INR: 1.6

## 2012-09-08 ENCOUNTER — Encounter (HOSPITAL_COMMUNITY): Payer: Self-pay | Admitting: Pharmacy Technician

## 2012-09-08 ENCOUNTER — Telehealth: Payer: Self-pay | Admitting: *Deleted

## 2012-09-08 ENCOUNTER — Encounter: Payer: Self-pay | Admitting: Pharmacist

## 2012-09-08 ENCOUNTER — Other Ambulatory Visit: Payer: Self-pay | Admitting: *Deleted

## 2012-09-08 ENCOUNTER — Other Ambulatory Visit: Payer: Self-pay | Admitting: Neurological Surgery

## 2012-09-08 MED ORDER — ENOXAPARIN SODIUM 80 MG/0.8ML ~~LOC~~ SOLN: 80.0000 mg | Freq: Two times a day (BID) | SUBCUTANEOUS | Status: DC

## 2012-09-08 MED ORDER — ISOSORBIDE MONONITRATE ER 30 MG PO TB24: 30.0000 mg | ORAL_TABLET | Freq: Every day | ORAL | Status: DC

## 2012-09-08 NOTE — Telephone Encounter (Signed)
This encounter was created in error - please disregard.

## 2012-09-08 NOTE — Telephone Encounter (Signed)
He will need to be on Lovenox until 24 hours before the surgery, to stop Coumadin 5 days He will need to be instructed on the injections

## 2012-09-08 NOTE — Telephone Encounter (Signed)
Message copied by Velda Shell on Thu Sep 08, 2012  4:57 PM ------      Message from: Jeannine Kitten      Created: Thu Sep 08, 2012  4:44 PM       Erie Noe called stating that Dr Lucianne Muss at Dr Everardo All office states that pt needs to be bridged with Lovenox. Pt was instructed by Dr Yetta Barre to hold coumadin for 5 days prior to procedure. The pt called and states he knows that today is last day to take coumadin and will need to be called regarding his Lovenox  Cell [phone]      Thanks       Jasmine December ------

## 2012-09-08 NOTE — Telephone Encounter (Signed)
Erie Noe called stating pt is having a lumbar laminectomy 3 levels surgery next week. They need him to be off coumadin for 5 days. They need to know if the pt needs to be bridged with lovenox in the meantime? Please advise ASAP! Thank you!

## 2012-09-08 NOTE — Telephone Encounter (Signed)
Spoke with pt.  Weight- 80kg, SCr- 1.27  CrCl- 68mL/min  7/31- Last dose of Coumadin 8/1- No Coumadin or Lovenox  8/2- Lovenox 80mg  in AM and PM 8/3- Lovenox 80mg  in AM and PM 8/4- Lovenox 80mg  in AM and PM 8/5- Lovenox 80mg  in AM only 8/6- Day of Procedure.  No Lovenox or Coumadin.   He is in the donut hole and is worried about the cost.  Will check with the pharmacy then call us to let us know tomorrow.

## 2012-09-08 NOTE — Telephone Encounter (Signed)
Pt called early this am and said had been instructed to hold coumadin for Lumbar laminectomy on 09/14/2012 and needed to know if needs Lovenox bridge.Called and spoke to [place] at Dr Yetta Barre office and she did confirm that pt needs to be off coumadin for 5 days and has not at this point had cardiac clearance. Spoke with Laureen Dr Excell Seltzer nurse and she states that when he was a pt of Dr Riley Kill and that he deferred to what Dr Everardo All said about cardiac clearance . Erie Noe called and said had gotten information from Dr Lucianne Muss as Dr Everardo All is out of town and that pt does need to be bridged

## 2012-09-09 ENCOUNTER — Encounter (HOSPITAL_COMMUNITY)
Admission: RE | Admit: 2012-09-09 | Discharge: 2012-09-09 | Disposition: A | Discharge status: Home / Self Care | Payer: Medicare Other | Source: Ambulatory Visit | Attending: Neurological Surgery | Admitting: Neurological Surgery

## 2012-09-09 ENCOUNTER — Telehealth: Payer: Self-pay | Admitting: *Deleted

## 2012-09-09 ENCOUNTER — Encounter (HOSPITAL_COMMUNITY)
Admission: RE | Admit: 2012-09-09 | Discharge: 2012-09-09 | Disposition: A | Discharge status: Home / Self Care | Payer: Medicare Other | Source: Ambulatory Visit | Attending: Anesthesiology | Admitting: Anesthesiology

## 2012-09-09 ENCOUNTER — Encounter (HOSPITAL_COMMUNITY): Payer: Self-pay

## 2012-09-09 DIAGNOSIS — I10 Essential (primary) hypertension: Secondary | ICD-10-CM | POA: Diagnosis not present

## 2012-09-09 DIAGNOSIS — M48061 Spinal stenosis, lumbar region without neurogenic claudication: Secondary | ICD-10-CM | POA: Diagnosis not present

## 2012-09-09 DIAGNOSIS — R935 Abnormal findings on diagnostic imaging of other abdominal regions, including retroperitoneum: Secondary | ICD-10-CM | POA: Diagnosis not present

## 2012-09-09 DIAGNOSIS — K219 Gastro-esophageal reflux disease without esophagitis: Secondary | ICD-10-CM | POA: Diagnosis present

## 2012-09-09 DIAGNOSIS — I739 Peripheral vascular disease, unspecified: Secondary | ICD-10-CM | POA: Diagnosis present

## 2012-09-09 DIAGNOSIS — M4310 Spondylolisthesis, site unspecified: Secondary | ICD-10-CM | POA: Diagnosis not present

## 2012-09-09 DIAGNOSIS — M519 Unspecified thoracic, thoracolumbar and lumbosacral intervertebral disc disorder: Secondary | ICD-10-CM | POA: Diagnosis not present

## 2012-09-09 DIAGNOSIS — I2510 Atherosclerotic heart disease of native coronary artery without angina pectoris: Secondary | ICD-10-CM | POA: Diagnosis not present

## 2012-09-09 DIAGNOSIS — Z01818 Encounter for other preprocedural examination: Secondary | ICD-10-CM | POA: Diagnosis not present

## 2012-09-09 DIAGNOSIS — E119 Type 2 diabetes mellitus without complications: Secondary | ICD-10-CM | POA: Diagnosis not present

## 2012-09-09 DIAGNOSIS — Z01812 Encounter for preprocedural laboratory examination: Secondary | ICD-10-CM | POA: Diagnosis not present

## 2012-09-09 DIAGNOSIS — M545 Low back pain, unspecified: Secondary | ICD-10-CM | POA: Diagnosis not present

## 2012-09-09 HISTORY — DX: Personal history of urinary calculi: Z87.442

## 2012-09-09 HISTORY — DX: Nausea with vomiting, unspecified: R11.2

## 2012-09-09 HISTORY — DX: Other specified postprocedural states: Z98.890

## 2012-09-09 LAB — CBC WITH DIFFERENTIAL/PLATELET
Basophils Relative: 0 % (ref 0–1)
Eosinophils Absolute: 0.3 10*3/uL (ref 0.0–0.7)
HCT: 35.3 % — ABNORMAL LOW (ref 39.0–52.0)
Hemoglobin: 12 g/dL — ABNORMAL LOW (ref 13.0–17.0)
Lymphs Abs: 1.4 10*3/uL (ref 0.7–4.0)
MCH: 31.7 pg (ref 26.0–34.0)
MCHC: 34 g/dL (ref 30.0–36.0)
MCV: 93.1 fL (ref 78.0–100.0)
Monocytes Absolute: 1 10*3/uL (ref 0.1–1.0)
Monocytes Relative: 12 % (ref 3–12)
RBC: 3.79 MIL/uL — ABNORMAL LOW (ref 4.22–5.81)

## 2012-09-09 LAB — PROTIME-INR: INR: 1.52 — ABNORMAL HIGH (ref 0.00–1.49)

## 2012-09-09 LAB — BASIC METABOLIC PANEL
BUN: 8 mg/dL (ref 6–23)
CO2: 29 mEq/L (ref 19–32)
Chloride: 102 mEq/L (ref 96–112)
Glucose, Bld: 117 mg/dL — ABNORMAL HIGH (ref 70–99)
Potassium: 3.5 mEq/L (ref 3.5–5.1)
Sodium: 138 mEq/L (ref 135–145)

## 2012-09-09 LAB — SURGICAL PCR SCREEN: Staphylococcus aureus: NEGATIVE

## 2012-09-09 LAB — APTT: aPTT: 33 seconds (ref 24–37)

## 2012-09-09 NOTE — Progress Notes (Signed)
Anesthesia Chart Review:  Patient is a 72 year old male scheduled for a three level lumbar laminectomy/decompression microdiscectomy on 09/14/12 by Dr. Yetta Barre.  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 Miachel Roux, [facility]; Dr. Virgilio Frees), PNA 03/2011, HTN, IBS, GERD, diverticulosis, tonsillectomy, prior back, shoulder, and cataract surgeries. PCP is listed as Dr. Dillon Bjork partner Dr. Lucianne Muss has recommended a Lovenox bridge while off Coumadin for surgery.  Neurologist has been Dr. Avie Echevaria, now Dr. Lesia Sago who referred patient to Dr. Yetta Barre following failed medical management of back pain with severe L4-5 stenosis.  Cardiologist has been Dr. Riley Kill, recently retired, with last visit on 05/26/12.  He was doing well from a cardiac standpoint at that time. He is now scheduled to see Dr. Tonny Bollman later this month.  Nuclear stress test on 08/26/11 showed: Normal stress nuclear study. LV Ejection Fraction: 70%. LV Wall Motion: NL LV Function; NL Wall Motion.  Cardiac cath on 03/17/01 showed: 1. The left main coronary artery has approximately 10-20% stenosis, but no significant flow-limiting lesions.  2. The left anterior descending is a medium caliber vessel that provides three diagonal branches. There is scattered 20% stenosis in the proximal to mid vessel with 30% more distal stenosis followed by an area of 50-60% stenosis towards the apex. Flow is TIMI-3 throughout this vessel.  3. The circumflex coronary artery essentially provides one large obtuse marginal branch then has a 20% stenosis.  4. The right coronary artery is a dominant vessel that essentially has two branches in the posterior descending distribution. There was a 30% distal RCA stenosis and a focal 95% stenosis in the smaller of the two branches in the posterior descending territory--that was not felt amenable tp PCI. 5. The left  ventriculography reveals an ejection fraction estimated at 60-65% with no focal wall motion abnormalities and no mitral regurgitation. Medical therapy was recommended at that time.  EKG on 06/07/12 showed NSR, LAD.  CXR on 09/09/12 showed no acute abnormalities.  Preoperative labs noted.  Plan PT/PTT on the day of surgery.  I did not evaluate patient during his PAT visit.  He does have ocular Myasthenia gravis--okay to take Mestinon on the day of surgery per Dr. Gypsy Balsam.  He also has CAD, PE/DVT, DM, and CML history.  Endocrinology/PCP has recommended Lovenox bridge.  From an oncology standpoint he was stable at his last visit in June 2014.  He had a normal nuclear stress test just over a year ago.  If follow-up labs are acceptable and otherwise no acute changes then I would anticipate that he could proceed as planned.  He will be evaluated by his assigned anesthesiologist on the day of surgery.  Velna Ochs [facility]/Anesthesiology Phone [phone] 09/09/2012 4:13 PM

## 2012-09-09 NOTE — Progress Notes (Addendum)
Ekg,3.14, stress results on chart and in epic, note dr Riley Kill in epic Dr Gypsy Balsam anesthesia called re: taking mestinon am of surgery. Okayed to take

## 2012-09-09 NOTE — Progress Notes (Signed)
09/09/12 1006  OBSTRUCTIVE SLEEP APNEA  Have you ever been diagnosed with sleep apnea through a sleep study? No  Do you snore loudly (loud enough to be heard through closed doors)?  1  Do you often feel tired, fatigued, or sleepy during the daytime? 0  Has anyone observed you stop breathing during your sleep? 0  Do you have, or are you being treated for high blood pressure? 1  BMI more than 35 kg/m2? 0  Age over 74 years old? 1  Neck circumference greater than 40 cm/18 inches? 0 (16.5)  Gender: 1  Obstructive Sleep Apnea Score 4  Score 4 or greater  Results sent to PCP

## 2012-09-09 NOTE — Telephone Encounter (Signed)
Spoke with pt this morning.  The cost of the Lovenox was reasonable for him.  He will pick it up from the pharmacy and follow instructions as below.

## 2012-09-09 NOTE — Telephone Encounter (Signed)
LVM with advise from Dr Lucianne Muss: He will need to be on Lovenox until 24 hours before the surgery, to stop Coumadin 5 days  He will need to be instructed on the injections.

## 2012-09-09 NOTE — Pre-Procedure Instructions (Addendum)
[name]  09/09/2012   Your procedure is scheduled on:  09/14/12  Report to [facility] [id number] AM.  Call this number if you have problems the morning of surgery: [phone]   Remember:   Do not eat food or drink liquids after midnight.   Take these medicines the morning of surgery with A SIP OF WATER nexium, gleevec, imdur, metoprolol, zofran (if needed), prednisone (if due), pain med,mestinon   Do not wear jewelry, make-up or nail polish.  Do not wear lotions, powders, or perfumes. You may wear deodorant.  Do not shave 48 hours prior to surgery. Men may shave face and neck.  Do not bring valuables to the hospital.  [facility] is not responsible                   for any belongings or valuables.  Contacts, dentures or bridgework may not be worn into surgery.  Leave suitcase in the car. After surgery it may be brought to your room.  For patients admitted to the hospital, checkout time is 11:00 AM the day of  discharge.   Patients discharged the day of surgery will not be allowed to drive  home.  Name and phone number of your driver:   Special Instructions: Shower using CHG 2 nights before surgery and the night before surgery.  If you shower the day of surgery use CHG.  Use special wash - you have one bottle of CHG for all showers.  You should use approximately 1/3 of the bottle for each shower.   Please read over the following fact sheets that you were given: Pain Booklet, Coughing and Deep Breathing, MRSA Information and Surgical Site Infection Prevention

## 2012-09-13 MED ORDER — HYDROMORPHONE HCL PF 1 MG/ML IJ SOLN
INTRAMUSCULAR | Status: AC
  Filled 2012-09-13: qty 1

## 2012-09-13 MED ORDER — CEFAZOLIN SODIUM-DEXTROSE 2-3 GM-% IV SOLR
2.0000 g | INTRAVENOUS | Status: AC
  Administered 2012-09-14: 2 g via INTRAVENOUS
  Filled 2012-09-13: qty 50

## 2012-09-14 ENCOUNTER — Encounter (HOSPITAL_COMMUNITY)
Admission: RE | Disposition: A | Discharge status: Home / Self Care | Payer: Self-pay | Source: Ambulatory Visit | Attending: Neurological Surgery

## 2012-09-14 ENCOUNTER — Encounter (HOSPITAL_COMMUNITY): Payer: Self-pay | Admitting: Vascular Surgery

## 2012-09-14 ENCOUNTER — Encounter (HOSPITAL_COMMUNITY): Payer: Self-pay | Admitting: *Deleted

## 2012-09-14 ENCOUNTER — Inpatient Hospital Stay (HOSPITAL_COMMUNITY): Payer: Medicare Other | Admitting: Certified Registered"

## 2012-09-14 ENCOUNTER — Inpatient Hospital Stay (HOSPITAL_COMMUNITY): Payer: Medicare Other

## 2012-09-14 ENCOUNTER — Other Ambulatory Visit: Payer: Self-pay

## 2012-09-14 ENCOUNTER — Inpatient Hospital Stay (HOSPITAL_COMMUNITY)
Admission: RE | Admit: 2012-09-14 | Discharge: 2012-09-16 | DRG: 491 | Disposition: A | Discharge status: Home / Self Care | Payer: Medicare Other | Source: Ambulatory Visit | Attending: Neurological Surgery | Admitting: Neurological Surgery

## 2012-09-14 DIAGNOSIS — Z01812 Encounter for preprocedural laboratory examination: Secondary | ICD-10-CM

## 2012-09-14 DIAGNOSIS — M48061 Spinal stenosis, lumbar region without neurogenic claudication: Principal | ICD-10-CM | POA: Diagnosis present

## 2012-09-14 DIAGNOSIS — I10 Essential (primary) hypertension: Secondary | ICD-10-CM | POA: Diagnosis not present

## 2012-09-14 DIAGNOSIS — I2510 Atherosclerotic heart disease of native coronary artery without angina pectoris: Secondary | ICD-10-CM | POA: Diagnosis not present

## 2012-09-14 DIAGNOSIS — Z9889 Other specified postprocedural states: Secondary | ICD-10-CM

## 2012-09-14 DIAGNOSIS — M4310 Spondylolisthesis, site unspecified: Secondary | ICD-10-CM | POA: Diagnosis not present

## 2012-09-14 DIAGNOSIS — I739 Peripheral vascular disease, unspecified: Secondary | ICD-10-CM | POA: Diagnosis present

## 2012-09-14 DIAGNOSIS — K219 Gastro-esophageal reflux disease without esophagitis: Secondary | ICD-10-CM | POA: Diagnosis present

## 2012-09-14 DIAGNOSIS — M519 Unspecified thoracic, thoracolumbar and lumbosacral intervertebral disc disorder: Secondary | ICD-10-CM | POA: Diagnosis not present

## 2012-09-14 DIAGNOSIS — R935 Abnormal findings on diagnostic imaging of other abdominal regions, including retroperitoneum: Secondary | ICD-10-CM | POA: Diagnosis not present

## 2012-09-14 DIAGNOSIS — E119 Type 2 diabetes mellitus without complications: Secondary | ICD-10-CM | POA: Diagnosis present

## 2012-09-14 HISTORY — PX: LUMBAR LAMINECTOMY/DECOMPRESSION MICRODISCECTOMY: SHX5026

## 2012-09-14 LAB — PROTIME-INR: INR: 0.96 (ref 0.00–1.49)

## 2012-09-14 LAB — GLUCOSE, CAPILLARY: Glucose-Capillary: 93 mg/dL (ref 70–99)

## 2012-09-14 LAB — APTT: aPTT: 30 seconds (ref 24–37)

## 2012-09-14 SURGERY — LUMBAR LAMINECTOMY/DECOMPRESSION MICRODISCECTOMY 3 LEVELS
Anesthesia: General | Site: Back | Laterality: Right | Wound class: Clean

## 2012-09-14 MED ORDER — DEXAMETHASONE SODIUM PHOSPHATE 4 MG/ML IJ SOLN
4.0000 mg | Freq: Four times a day (QID) | INTRAMUSCULAR | Status: DC
  Administered 2012-09-14 – 2012-09-15 (×3): 4 mg via INTRAVENOUS
  Filled 2012-09-14 (×10): qty 1

## 2012-09-14 MED ORDER — MENTHOL 3 MG MT LOZG: 1.0000 | LOZENGE | OROMUCOSAL | Status: DC | PRN

## 2012-09-14 MED ORDER — THROMBIN 5000 UNITS EX SOLR
CUTANEOUS | Status: DC | PRN
  Administered 2012-09-14 (×2): 5000 [IU] via TOPICAL

## 2012-09-14 MED ORDER — BACITRACIN 50000 UNITS IM SOLR
INTRAMUSCULAR | Status: AC
  Filled 2012-09-14: qty 1

## 2012-09-14 MED ORDER — ROCURONIUM BROMIDE 100 MG/10ML IV SOLN
INTRAVENOUS | Status: DC | PRN
  Administered 2012-09-14: 15 mg via INTRAVENOUS

## 2012-09-14 MED ORDER — ACETAMINOPHEN 325 MG PO TABS: 650.0000 mg | ORAL_TABLET | ORAL | Status: DC | PRN

## 2012-09-14 MED ORDER — HYDROMORPHONE HCL PF 1 MG/ML IJ SOLN
0.2500 mg | INTRAMUSCULAR | Status: DC | PRN
  Administered 2012-09-14 (×2): 0.5 mg via INTRAVENOUS

## 2012-09-14 MED ORDER — PYRIDOSTIGMINE BROMIDE 60 MG PO TABS
30.0000 mg | ORAL_TABLET | Freq: Three times a day (TID) | ORAL | Status: DC
  Administered 2012-09-15: 30 mg via ORAL
  Filled 2012-09-14 (×7): qty 0.5

## 2012-09-14 MED ORDER — LACTATED RINGERS IV SOLN
INTRAVENOUS | Status: DC | PRN
  Administered 2012-09-14 (×2): via INTRAVENOUS

## 2012-09-14 MED ORDER — METFORMIN HCL ER 500 MG PO TB24
500.0000 mg | ORAL_TABLET | Freq: Every day | ORAL | Status: DC
  Administered 2012-09-15 – 2012-09-16 (×2): 500 mg via ORAL
  Filled 2012-09-14 (×3): qty 1

## 2012-09-14 MED ORDER — DEXAMETHASONE SODIUM PHOSPHATE 10 MG/ML IJ SOLN: 10.0000 mg | INTRAMUSCULAR | Status: DC

## 2012-09-14 MED ORDER — MORPHINE SULFATE 2 MG/ML IJ SOLN
1.0000 mg | INTRAMUSCULAR | Status: DC | PRN
  Administered 2012-09-15: 2 mg via INTRAVENOUS
  Filled 2012-09-14 (×2): qty 1

## 2012-09-14 MED ORDER — ONDANSETRON HCL 4 MG/2ML IJ SOLN: 4.0000 mg | INTRAMUSCULAR | Status: DC | PRN

## 2012-09-14 MED ORDER — SODIUM CHLORIDE 0.9 % IJ SOLN: 3.0000 mL | INTRAMUSCULAR | Status: DC | PRN

## 2012-09-14 MED ORDER — SODIUM CHLORIDE 0.9 % IJ SOLN
3.0000 mL | Freq: Two times a day (BID) | INTRAMUSCULAR | Status: DC
  Administered 2012-09-14: 3 mL via INTRAVENOUS

## 2012-09-14 MED ORDER — ONDANSETRON HCL 4 MG/2ML IJ SOLN
4.0000 mg | Freq: Once | INTRAMUSCULAR | Status: AC | PRN
  Administered 2012-09-14: 4 mg via INTRAVENOUS

## 2012-09-14 MED ORDER — DEXAMETHASONE SODIUM PHOSPHATE 10 MG/ML IJ SOLN
INTRAMUSCULAR | Status: AC
  Administered 2012-09-14: 10 mg via INTRAVENOUS
  Filled 2012-09-14: qty 1

## 2012-09-14 MED ORDER — SODIUM CHLORIDE 0.9 % IR SOLN
Status: DC | PRN
  Administered 2012-09-14: 14:00:00

## 2012-09-14 MED ORDER — POTASSIUM CHLORIDE IN NACL 20-0.9 MEQ/L-% IV SOLN
INTRAVENOUS | Status: DC
  Filled 2012-09-14 (×4): qty 1000

## 2012-09-14 MED ORDER — FENTANYL CITRATE 0.05 MG/ML IJ SOLN
INTRAMUSCULAR | Status: DC | PRN
  Administered 2012-09-14 (×3): 50 ug via INTRAVENOUS

## 2012-09-14 MED ORDER — PHENOL 1.4 % MT LIQD: 1.0000 | OROMUCOSAL | Status: DC | PRN

## 2012-09-14 MED ORDER — ONDANSETRON HCL 4 MG/2ML IJ SOLN: 4.0000 mg | Freq: Once | INTRAMUSCULAR | Status: DC

## 2012-09-14 MED ORDER — LORAZEPAM 0.5 MG PO TABS: 0.5000 mg | ORAL_TABLET | Freq: Every evening | ORAL | Status: DC | PRN

## 2012-09-14 MED ORDER — LISINOPRIL 5 MG PO TABS
5.0000 mg | ORAL_TABLET | Freq: Every day | ORAL | Status: DC
  Administered 2012-09-14 – 2012-09-15 (×2): 5 mg via ORAL
  Filled 2012-09-14 (×3): qty 1

## 2012-09-14 MED ORDER — PANTOPRAZOLE SODIUM 40 MG PO TBEC
40.0000 mg | DELAYED_RELEASE_TABLET | Freq: Every day | ORAL | Status: DC
  Administered 2012-09-15 – 2012-09-16 (×2): 40 mg via ORAL
  Filled 2012-09-14 (×2): qty 1

## 2012-09-14 MED ORDER — LIDOCAINE HCL (CARDIAC) 20 MG/ML IV SOLN
INTRAVENOUS | Status: DC | PRN
  Administered 2012-09-14: 200 mg via INTRAVENOUS

## 2012-09-14 MED ORDER — ISOSORBIDE MONONITRATE ER 30 MG PO TB24
30.0000 mg | ORAL_TABLET | Freq: Every day | ORAL | Status: DC
  Administered 2012-09-15 – 2012-09-16 (×2): 30 mg via ORAL
  Filled 2012-09-14 (×2): qty 1

## 2012-09-14 MED ORDER — ONDANSETRON HCL 4 MG/2ML IJ SOLN
INTRAMUSCULAR | Status: AC
  Administered 2012-09-14: 4 mg
  Filled 2012-09-14: qty 2

## 2012-09-14 MED ORDER — HEMOSTATIC AGENTS (NO CHARGE) OPTIME
TOPICAL | Status: DC | PRN
  Administered 2012-09-14: 1 via TOPICAL

## 2012-09-14 MED ORDER — ONDANSETRON HCL 4 MG/2ML IJ SOLN
INTRAMUSCULAR | Status: DC | PRN
  Administered 2012-09-14: 4 mg via INTRAVENOUS

## 2012-09-14 MED ORDER — PROPOFOL 10 MG/ML IV BOLUS
INTRAVENOUS | Status: DC | PRN
  Administered 2012-09-14: 160 mg via INTRAVENOUS

## 2012-09-14 MED ORDER — PREDNISONE 5 MG PO TABS
5.0000 mg | ORAL_TABLET | ORAL | Status: DC
  Administered 2012-09-15: 5 mg via ORAL
  Filled 2012-09-14: qty 1

## 2012-09-14 MED ORDER — DEXAMETHASONE 4 MG PO TABS
4.0000 mg | ORAL_TABLET | Freq: Four times a day (QID) | ORAL | Status: DC
  Administered 2012-09-15 – 2012-09-16 (×3): 4 mg via ORAL
  Filled 2012-09-14 (×10): qty 1

## 2012-09-14 MED ORDER — 0.9 % SODIUM CHLORIDE (POUR BTL) OPTIME
TOPICAL | Status: DC | PRN
  Administered 2012-09-14: 1000 mL

## 2012-09-14 MED ORDER — LACTATED RINGERS IV SOLN
INTRAVENOUS | Status: DC
  Administered 2012-09-14: 12:00:00 via INTRAVENOUS

## 2012-09-14 MED ORDER — CEFAZOLIN SODIUM 1-5 GM-% IV SOLN
1.0000 g | Freq: Three times a day (TID) | INTRAVENOUS | Status: AC
  Administered 2012-09-14 – 2012-09-15 (×2): 1 g via INTRAVENOUS
  Filled 2012-09-14 (×2): qty 50

## 2012-09-14 MED ORDER — METOPROLOL SUCCINATE ER 25 MG PO TB24
25.0000 mg | ORAL_TABLET | Freq: Every day | ORAL | Status: DC
  Administered 2012-09-15 – 2012-09-16 (×2): 25 mg via ORAL
  Filled 2012-09-14 (×3): qty 1

## 2012-09-14 MED ORDER — GELATIN ABSORBABLE MT POWD
OROMUCOSAL | Status: DC | PRN
  Administered 2012-09-14: 17:00:00 via TOPICAL

## 2012-09-14 MED ORDER — IMATINIB MESYLATE 400 MG PO TABS: 400.0000 mg | ORAL_TABLET | Freq: Every day | ORAL | Status: DC

## 2012-09-14 MED ORDER — ONDANSETRON HCL 4 MG/2ML IJ SOLN
INTRAMUSCULAR | Status: AC
  Filled 2012-09-14: qty 2

## 2012-09-14 MED ORDER — HYDROCODONE-ACETAMINOPHEN 5-325 MG PO TABS: 1.0000 | ORAL_TABLET | ORAL | Status: DC | PRN

## 2012-09-14 MED ORDER — ACETAMINOPHEN 650 MG RE SUPP: 650.0000 mg | RECTAL | Status: DC | PRN

## 2012-09-14 MED ORDER — BUPIVACAINE HCL (PF) 0.25 % IJ SOLN
INTRAMUSCULAR | Status: DC | PRN
  Administered 2012-09-14: 7 mL

## 2012-09-14 MED ORDER — SODIUM CHLORIDE 0.9 % IV SOLN
INTRAVENOUS | Status: AC
  Filled 2012-09-14: qty 500

## 2012-09-14 SURGICAL SUPPLY — 47 items
APL SKNCLS STERI-STRIP NONHPOA (GAUZE/BANDAGES/DRESSINGS) ×1
BAG DECANTER FOR FLEXI CONT (MISCELLANEOUS) ×2 IMPLANT
BENZOIN TINCTURE PRP APPL 2/3 (GAUZE/BANDAGES/DRESSINGS) ×2 IMPLANT
BUR MATCHSTICK NEURO 3.0 LAGG (BURR) ×2 IMPLANT
CANISTER SUCTION 2500CC (MISCELLANEOUS) ×2 IMPLANT
CLOTH BEACON ORANGE TIMEOUT ST (SAFETY) ×2 IMPLANT
CONT SPEC 4OZ CLIKSEAL STRL BL (MISCELLANEOUS) ×2 IMPLANT
DRAPE LAPAROTOMY 100X72X124 (DRAPES) ×2 IMPLANT
DRAPE MICROSCOPE ZEISS OPMI (DRAPES) ×2 IMPLANT
DRAPE POUCH INSTRU U-SHP 10X18 (DRAPES) ×2 IMPLANT
DRAPE SURG 17X23 STRL (DRAPES) ×2 IMPLANT
DRESSING TELFA 8X3 (GAUZE/BANDAGES/DRESSINGS) IMPLANT
DRSG OPSITE 4X5.5 SM (GAUZE/BANDAGES/DRESSINGS) ×4 IMPLANT
DRSG OPSITE POSTOP 4X8 (GAUZE/BANDAGES/DRESSINGS) ×4 IMPLANT
DURAPREP 26ML APPLICATOR (WOUND CARE) ×2 IMPLANT
ELECT REM PT RETURN 9FT ADLT (ELECTROSURGICAL) ×2
ELECTRODE REM PT RTRN 9FT ADLT (ELECTROSURGICAL) ×1 IMPLANT
GAUZE SPONGE 4X4 16PLY XRAY LF (GAUZE/BANDAGES/DRESSINGS) IMPLANT
GLOVE BIO SURGEON STRL SZ8 (GLOVE) ×2 IMPLANT
GLOVE BIOGEL PI IND STRL 7.0 (GLOVE) ×1 IMPLANT
GLOVE BIOGEL PI IND STRL 7.5 (GLOVE) ×1 IMPLANT
GLOVE BIOGEL PI INDICATOR 7.0 (GLOVE) ×1
GLOVE BIOGEL PI INDICATOR 7.5 (GLOVE) ×1
GLOVE ECLIPSE 7.5 STRL STRAW (GLOVE) ×4 IMPLANT
GLOVE OPTIFIT SS 6.5 STRL BRWN (GLOVE) ×4 IMPLANT
GOWN BRE IMP SLV AUR LG STRL (GOWN DISPOSABLE) IMPLANT
GOWN BRE IMP SLV AUR XL STRL (GOWN DISPOSABLE) ×2 IMPLANT
GOWN STRL REIN 2XL LVL4 (GOWN DISPOSABLE) IMPLANT
HEMOSTAT POWDER KIT SURGIFOAM (HEMOSTASIS) ×2 IMPLANT
KIT BASIN OR (CUSTOM PROCEDURE TRAY) ×2 IMPLANT
KIT ROOM TURNOVER OR (KITS) ×2 IMPLANT
NEEDLE HYPO 25X1 1.5 SAFETY (NEEDLE) ×2 IMPLANT
NEEDLE SPNL 20GX3.5 QUINCKE YW (NEEDLE) IMPLANT
NS IRRIG 1000ML POUR BTL (IV SOLUTION) ×2 IMPLANT
PACK LAMINECTOMY NEURO (CUSTOM PROCEDURE TRAY) ×2 IMPLANT
PAD ARMBOARD 7.5X6 YLW CONV (MISCELLANEOUS) ×6 IMPLANT
RUBBERBAND STERILE (MISCELLANEOUS) ×4 IMPLANT
SPONGE SURGIFOAM ABS GEL SZ50 (HEMOSTASIS) ×2 IMPLANT
STRIP CLOSURE SKIN 1/2X4 (GAUZE/BANDAGES/DRESSINGS) ×2 IMPLANT
SUT VIC AB 0 CT1 18XCR BRD8 (SUTURE) ×1 IMPLANT
SUT VIC AB 0 CT1 8-18 (SUTURE) ×2
SUT VIC AB 2-0 CP2 18 (SUTURE) ×2 IMPLANT
SUT VIC AB 3-0 SH 8-18 (SUTURE) ×4 IMPLANT
SYR 20ML ECCENTRIC (SYRINGE) ×2 IMPLANT
TOWEL OR 17X24 6PK STRL BLUE (TOWEL DISPOSABLE) ×2 IMPLANT
TOWEL OR 17X26 10 PK STRL BLUE (TOWEL DISPOSABLE) ×2 IMPLANT
WATER STERILE IRR 1000ML POUR (IV SOLUTION) ×2 IMPLANT

## 2012-09-14 NOTE — Transfer of Care (Signed)
Immediate Anesthesia Transfer of Care Note  Patient: [name]  Procedure(s) Performed: Procedure(s): Right Lumbar three-four, four-five, Lumbar five-Sacral one decompressive laminectomy (Right)  Patient Location: PACU  Anesthesia Type:General  Level of Consciousness: sedated  Airway & Oxygen Therapy: Patient Spontanous Breathing and Patient connected to face mask oxygen  Post-op Assessment: Report given to PACU RN and Post -op Vital signs reviewed and stable  Post vital signs: Reviewed and stable  Complications: No apparent anesthesia complications

## 2012-09-14 NOTE — H&P (Signed)
Subjective: Patient is a 72 y.o. male admitted for DLL. Onset of symptoms was several months ago, gradually worsening since that time.  The pain is rated severe, and is located at the across the lower back and radiates to RLE. The pain is described as aching and occurs intermittently. The symptoms have been progressive. Symptoms are exacerbated by exercise and standing for more than a few minutes. MRI or CT showed stenosis.   Past Medical History  Diagnosis Date  . Coronary atherosclerosis of unspecified type of vessel, native or graft     remote cath in 2003 showing distal and branch vessel CAD with normal LV function; managed medically  . Mixed hyperlipidemia   . Glucose intolerance (impaired glucose tolerance)     history of  . Diverticulosis of colon (without mention of hemorrhage)   . Hx of colonic polyps   . Internal hemorrhoids without mention of complication   . Rash and other nonspecific skin eruption   . Pneumonia, organism unspecified   . Conjunctivitis unspecified   . Myasthenia gravis     uses prednisone  . GERD (gastroesophageal reflux disease)   . Pulmonary embolism June 2013  . DVT (deep venous thrombosis) June 2013  . Chronic anticoagulation   . Diabetes mellitus   . DJD (degenerative joint disease)   . Ptosis   . CML (chronic myeloid leukemia)   . HTN (hypertension)   . IBS (irritable bowel syndrome)   . History of pneumonia   . Dystonia   . High cholesterol   . Heart disease   . Cancer   . 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 PONV (postoperative nausea and vomiting)   . External hemorrhoids without mention of complication     blood clot left 6/13 with pe  . History of kidney stones     Past Surgical History  Procedure Laterality Date  . Angioplasty  2001  . Back surgery      x2, lumbar and cervical  . Tonsillectomy    . Cataract extraction Bilateral 12/12  . Shoulder surgery Left 05/07/2009  . Cardiac catheterization      Prior to  Admission medications   Medication Sig Start Date End Date Taking? Authorizing Provider  enoxaparin (LOVENOX) 80 MG/0.8ML injection Inject 0.8 mLs (80 mg total) into the skin every 12 (twelve) hours. 09/08/12  Yes Tonny Bollman, MD  esomeprazole (NEXIUM) 40 MG capsule Take 40 mg by mouth daily before breakfast. 09/23/11  Yes Amy S Esterwood, PA-C  imatinib (GLEEVEC) 400 MG tablet Take 400 mg by mouth daily.    Yes Historical Provider, MD  isosorbide mononitrate (IMDUR) 30 MG 24 hr tablet Take 1 tablet (30 mg total) by mouth daily. 09/08/12  Yes Herby Abraham, MD  lisinopril (PRINIVIL,ZESTRIL) 5 MG tablet Take 5 mg by mouth daily. 04/26/12  Yes Herby Abraham, MD  loperamide (IMODIUM) 2 MG capsule Take 2 mg by mouth daily. For diarrhea   Yes Historical Provider, MD  LORazepam (ATIVAN) 0.5 MG tablet Take 0.5 mg by mouth at bedtime as needed for anxiety. For sleep. 06/10/12  Yes Kathlen Mody, MD  metFORMIN (GLUCOPHAGE-XR) 500 MG 24 hr tablet Take 500 mg by mouth every morning. TAKE 1 TABLET BY MOUTH EVERY DAY IN THE MORNING 08/30/12  Yes Romero Belling, MD  metoprolol succinate (TOPROL-XL) 25 MG 24 hr tablet Take 25 mg by mouth daily. 01/13/12  Yes Herby Abraham, MD  Multiple Vitamin (MULTIVITAMIN) tablet Take 1 tablet by mouth  daily.   Yes Historical Provider, MD  Multiple Vitamins-Minerals (OCUVITE PO) Take 1 tablet by mouth daily.   Yes Historical Provider, MD  ondansetron (ZOFRAN) 4 MG tablet Take 4 mg by mouth every 8 (eight) hours as needed for nausea.   Yes Historical Provider, MD  promethazine (PHENERGAN) 25 MG tablet Take 12.5 mg by mouth every 6 (six) hours as needed for nausea.   Yes Historical Provider, MD  pyridostigmine (MESTINON) 60 MG tablet Take 30 mg by mouth 3 (three) times daily.   Yes Historical Provider, MD  traMADol (ULTRAM) 50 MG tablet Take 50 mg by mouth every 4 (four) hours as needed. For pain   Yes Historical Provider, MD  warfarin (COUMADIN) 5 MG tablet Take 2.5-5 mg by  mouth See admin instructions. 2.5 mg Monday Tuesday WednesdayThursdaySaturday Sunday, 5 mg on Friday. 03/02/12  Yes Herby Abraham, MD  predniSONE (DELTASONE) 5 MG tablet Take 5 mg by mouth every other day.    Historical Provider, MD   Allergies  Allergen Reactions  . Oxycodone-Acetaminophen Other (See Comments)    Went "out of head"  . Sulfamethoxazole Rash    History  Substance Use Topics  . Smoking status: Never Smoker   . Smokeless tobacco: Never Used  . Alcohol Use: No    Family History  Problem Relation Age of Onset  . Heart disease Mother   . Heart attack Mother   . Stroke Mother   . Heart disease Father   . Heart attack Father   . Stroke Father   . Breast cancer Sister     Twin   . Heart attack Sister   . Dementia Sister   . Heart disease Sister   . Heart attack Sister   . Clotting disorder Sister   . Heart attack Sister      Review of Systems  Positive ROS: neg  All other systems have been reviewed and were otherwise negative with the exception of those mentioned in the HPI and as above.  Objective: Vital signs in last 24 hours: Temp:  [97.1 F (36.2 C)] 97.1 F (36.2 C) (08/06 1155) Pulse Rate:  [64] 64 (08/06 1155) Resp:  [18] 18 (08/06 1155) BP: (135)/(65) 135/65 mmHg (08/06 1155) SpO2:  [99 %] 99 % (08/06 1155)  General Appearance: Alert, cooperative, no distress, appears stated age Head: Normocephalic, without obvious abnormality, atraumatic Eyes: PERRL, conjunctiva/corneas clear, EOM's intact    Neck: Supple, symmetrical, trachea midline Back: Symmetric, no curvature, ROM normal, no CVA tenderness Lungs:  respirations unlabored Heart: Regular rate and rhythm Abdomen: Soft, non-tender Extremities: Extremities normal, atraumatic, no cyanosis or edema Pulses: 2+ and symmetric all extremities Skin: Skin color, texture, turgor normal, no rashes or lesions  NEUROLOGIC:   Mental status: Alert and oriented x4,  no aphasia, good attention span,  fund of knowledge, and memory Motor Exam - grossly normal Sensory Exam - grossly normal Reflexes: 1+ Coordination - grossly normal Gait - grossly normal Balance - grossly normal Cranial Nerves: I: smell Not tested  II: visual acuity  OS: nl    OD: nl  II: visual fields Full to confrontation  II: pupils Equal, round, reactive to light  III,VII: ptosis None  III,IV,VI: extraocular muscles  Full ROM  V: mastication Normal  V: facial light touch sensation  Normal  V,VII: corneal reflex  Present  VII: facial muscle function - upper  Normal  VII: facial muscle function - lower Normal  VIII: hearing Not tested  IX:  soft palate elevation  Normal  IX,X: gag reflex Present  XI: trapezius strength  5/5  XI: sternocleidomastoid strength 5/5  XI: neck flexion strength  5/5  XII: tongue strength  Normal    Data Review Lab Results  Component Value Date   WBC 8.5 09/09/2012   HGB 12.0* 09/09/2012   HCT 35.3* 09/09/2012   MCV 93.1 09/09/2012   PLT 219 09/09/2012   Lab Results  Component Value Date   NA 138 09/09/2012   K 3.5 09/09/2012   CL 102 09/09/2012   CO2 29 09/09/2012   BUN 8 09/09/2012   CREATININE 1.30 09/09/2012   GLUCOSE 117* 09/09/2012   Lab Results  Component Value Date   INR 0.96 09/14/2012    Assessment/Plan: Patient admitted for DLL L3-4, L4-5, L5-S1. Patient has failed a reasonable attempt at conservative therapy.  I explained the condition and procedure to the patient and answered any questions.  Patient wishes to proceed with procedure as planned. Understands risks/ benefits and typical outcomes of procedure.   Kaytee Taliercio S 09/14/2012 1:53 PM

## 2012-09-14 NOTE — Op Note (Signed)
09/14/2012  4:55 PM  PATIENT:  [name]  72 y.o. male  PRE-OPERATIVE DIAGNOSIS:  Lumbar spinal stenosis L3-4 L4-5 L5-S1 with right leg pain  POST-OPERATIVE DIAGNOSIS:  Same  PROCEDURE:  Right L3-4 L4-5 L5-S1 hemilaminectomy, medial facetectomy, and foraminotomies  SURGEON:  Marikay Alar, MD  ASSISTANTS: none  ANESTHESIA:   General  EBL: 100 ml  Total I/O In: 1500 [I.V.:1500] Out: 100 [Blood:100]  BLOOD ADMINISTERED:none  DRAINS: None   SPECIMEN:  No Specimen  INDICATION FOR PROCEDURE: This patient presented with severe right leg pain. MRI showed severe spinal stenosis at L3-4 L4-5 and L5-S1. He had grade 1 spondylolisthesis at L4-5. Has multiple medical problems. He wanted to avoid and instrumented fusion and therefore recommended simple decompressive hemilaminectomies at each level.  Patient understood the risks, benefits, and alternatives and potential outcomes and wished to proceed.  PROCEDURE DETAILS: The patient was taken to the operating room and after induction of adequate generalized endotracheal anesthesia, the patient was rolled into the prone position on the [name] frame and all pressure points were padded. The lumbar region was cleaned and then prepped with DuraPrep and draped in the usual sterile fashion. 5 cc of local anesthesia was injected and then a dorsal midline incision was made and carried down to the lumbo sacral fascia. The fascia was opened and the paraspinous musculature was taken down in a subperiosteal fashion to expose L3-4 L4-5 L5-S1 on the right. Intraoperative x-ray confirmed my level, and then I used a combination of the high-speed drill and the Kerrison punches to perform a hemilaminectomy, medial facetectomy, and foraminotomy at L3-4 L4-5 and L5-S1 on the right. The underlying yellow ligament was opened and removed in a piecemeal fashion to expose the underlying dura and exiting nerve root. I undercut the lateral recess and dissected down until I  was medial to and distal to the pedicle. There was severe compression at L5-S1 and L4-5 on the right with significant deviation of the L 5 and S1 nerve roots. Undercut the lateral recess to decompress these nerves. This was also done at L3-4 on the right knee had a previous decompressive hemilaminectomy and discectomy there, and I did remove some scar and residual or recurrent disc herniation at L3-4 on the right.  The nerve root was well decompressed at each level. We then gently retracted the nerve root medially with a retractor, coagulated the epidural venous vasculature, and inspected the disc space. Found a subannular disc protrusions without significant herniation and decided not to cut the disc at any level.  I then palpated with a coronary dilator along the nerve root at each level and into the foramen to assure adequate decompression. I felt no more compression of the nerve root at any level. I irrigated with saline solution containing bacitracin. Achieved hemostasis with bipolar cautery, lined the dura with Gelfoam, and then closed the fascia with 0 Vicryl. I closed the subcutaneous tissues with 2-0 Vicryl and the subcuticular tissues with 3-0 Vicryl. The skin was then closed with benzoin and Steri-Strips. The drapes were removed, a sterile dressing was applied. The patient was awakened from general anesthesia and transferred to the recovery room in stable condition. At the end of the procedure all sponge, needle and instrument counts were correct.   PLAN OF CARE: Admit to inpatient   PATIENT DISPOSITION:  PACU - hemodynamically stable.   Delay start of Pharmacological VTE agent (>24hrs) due to surgical blood loss or risk of bleeding:  yes

## 2012-09-14 NOTE — Anesthesia Postprocedure Evaluation (Signed)
  Anesthesia Post-op Note  Patient: [name]  Procedure(s) Performed: Procedure(s): Right Lumbar three-four, four-five, Lumbar five-Sacral one decompressive laminectomy (Right)  Patient Location: PACU  Anesthesia Type:General  Level of Consciousness: awake  Airway and Oxygen Therapy: Patient Spontanous Breathing  Post-op Pain: mild  Post-op Assessment: Post-op Vital signs reviewed  Post-op Vital Signs: Reviewed  Complications: No apparent anesthesia complications

## 2012-09-14 NOTE — Preoperative (Signed)
Beta Blockers   Reason not to administer Beta Blockers:Not Applicable 

## 2012-09-14 NOTE — Anesthesia Preprocedure Evaluation (Addendum)
Anesthesia Evaluation  Patient identified by MRN, date of birth, ID band Patient awake    Reviewed: Allergy & Precautions, H&P , NPO status , Patient's Chart, lab work & pertinent test results  History of Anesthesia Complications (+) PONV  Airway Mallampati: II TM Distance: >3 FB Neck ROM: full    Dental  (+) Dental Advisory Given   Pulmonary          Cardiovascular hypertension, + angina + Peripheral Vascular Disease Rhythm:regular Rate:Normal     Neuro/Psych  Neuromuscular disease    GI/Hepatic GERD-  ,  Endo/Other  diabetes, Type 2  Renal/GU      Musculoskeletal   Abdominal   Peds  Hematology  (+) Blood dyscrasia, ,   Anesthesia Other Findings   Reproductive/Obstetrics                          Anesthesia Physical Anesthesia Plan  ASA: III  Anesthesia Plan: General   Post-op Pain Management:    Induction: Intravenous  Airway Management Planned: Oral ETT  Additional Equipment:   Intra-op Plan:   Post-operative Plan: Extubation in OR  Informed Consent: I have reviewed the patients History and Physical, chart, labs and discussed the procedure including the risks, benefits and alternatives for the proposed anesthesia with the patient or authorized representative who has indicated his/her understanding and acceptance.     Plan Discussed with: CRNA, Anesthesiologist and Surgeon  Anesthesia Plan Comments:         Anesthesia Quick Evaluation

## 2012-09-14 NOTE — Anesthesia Procedure Notes (Signed)
Procedure Name: Intubation Date/Time: 09/14/2012 2:38 PM Performed by: Armandina Gemma Pre-anesthesia Checklist: Patient identified, Timeout performed, Emergency Drugs available, Suction available and Patient being monitored Patient Re-evaluated:Patient Re-evaluated prior to inductionOxygen Delivery Method: Circle system utilized Preoxygenation: Pre-oxygenation with 100% oxygen Intubation Type: IV induction Ventilation: Mask ventilation without difficulty Laryngoscope Size: Miller and 2 Grade View: Grade I Tube size: 7.5 mm Number of attempts: 1 Airway Equipment and Method: Stylet Placement Confirmation: ETT inserted through vocal cords under direct vision,  positive ETCO2 and breath sounds checked- equal and bilateral Secured at: 22 cm Tube secured with: Tape Dental Injury: Teeth and Oropharynx as per pre-operative assessment  Comments: IV induction Smith- intubation AM CRNA atraumatic

## 2012-09-15 LAB — GLUCOSE, CAPILLARY
Glucose-Capillary: 168 mg/dL — ABNORMAL HIGH (ref 70–99)
Glucose-Capillary: 167 mg/dL — ABNORMAL HIGH (ref 70–99)

## 2012-09-15 NOTE — Progress Notes (Signed)
Patient ID: [name], male   DOB: [birth year], 72 y.o.   MRN: [id number] Subjective: Patient reports he's doing well. Leg pain much better. Back sore. No N/T/W. Walking some.  Objective: Vital signs in last 24 hours: Temp:  [96.9 F (36.1 C)-98.5 F (36.9 C)] 97.8 F (36.6 C) (08/07 0354) Pulse Rate:  [62-85] 70 (08/07 0354) Resp:  [10-21] 20 (08/07 0354) BP: (128-156)/(57-82) 145/82 mmHg (08/07 0354) SpO2:  [93 %-100 %] 97 % (08/07 0354)  Intake/Output from previous day: 08/06 0701 - 08/07 0700 In: 1800 [I.V.:1800] Out: 100 [Blood:100] Intake/Output this shift:    Neurologic: Grossly normal  Lab Results: Lab Results  Component Value Date   WBC 8.5 09/09/2012   HGB 12.0* 09/09/2012   HCT 35.3* 09/09/2012   MCV 93.1 09/09/2012   PLT 219 09/09/2012   Lab Results  Component Value Date   INR 0.96 09/14/2012   BMET Lab Results  Component Value Date   NA 138 09/09/2012   K 3.5 09/09/2012   CL 102 09/09/2012   CO2 29 09/09/2012   GLUCOSE 117* 09/09/2012   BUN 8 09/09/2012   CREATININE 1.30 09/09/2012   CALCIUM 9.2 09/09/2012    Studies/Results: Dg Lumbar Spine 1 View  09/14/2012   *RADIOLOGY REPORT*  Clinical Data: Laminectomy  LUMBAR SPINE - 1 VIEW  Comparison: Preoperative radiographs 09/06/2012  Findings: Single cross-table lateral radiographs demonstrate soft tissue spreaders in the posterior soft tissues at the L4-L5 level. There are three metallic markers.  The most cephalad is located at the L3-L4 apophysis.  The central probe projects over the inferior articulating process of L4, and the most inferior probe projects over the inferior articulating process of L5.  IMPRESSION: Intraoperative localization radiograph as above.   Original Report Authenticated By: Malachy Moan, M.D.   Dg Or Local Abdomen  09/14/2012   *RADIOLOGY REPORT*  Clinical Data: Missing screw  OR LOCAL ABDOMEN  Comparison: Cross-table lateral radiographs obtained earlier today at 15 14 p.m.  Findings: Limited PA  radiographs of the lumbar spine demonstrate no metallic foreign body resembling a screw.  There is multilevel degenerative disc disease.  Radiopaque snaps projects over the right aspect of the patient's, likely down.  IMPRESSION: No metallic foreign body resembling a screw identified.   Original Report Authenticated By: Malachy Moan, M.D.    Assessment/Plan: Doing well. Home likely tomorrow. Restart coumadin in 4 days.   LOS: 1 day    [name] S 09/15/2012, 7:56 AM

## 2012-09-16 LAB — GLUCOSE, CAPILLARY

## 2012-09-16 MED ORDER — TRAMADOL HCL 50 MG PO TABS: 50.0000 mg | ORAL_TABLET | ORAL | Status: DC | PRN

## 2012-09-16 NOTE — Discharge Summary (Signed)
Physician Discharge Summary  Patient ID: [name] MRN: [id number] DOB/AGE: May 24, 1940 72 y.o.  Admit date: 09/14/2012 Discharge date: 09/16/2012  Admission Diagnoses: lumbar stenosis    Discharge Diagnoses: same   Discharged Condition: good  Hospital Course: The patient was admitted on 09/14/2012 and taken to the operating room where the patient underwent R L3-4, L4-5, L5-S1 decompressive lum lam. The patient tolerated the procedure well and was taken to the recovery room and then to the floor in stable condition. The hospital course was routine. There were no complications. The wound remained clean dry and intact. Pt had appropriate back soreness. No complaints of leg pain or new N/T/W. The patient remained afebrile with stable vital signs, and tolerated a regular diet. The patient continued to increase activities, and pain was well controlled with oral pain medications. Resume warfarin in 2 days.  Consults: None  Significant Diagnostic Studies:  Results for orders placed during the hospital encounter of 09/14/12  APTT      Result Value Range   aPTT 30  24 - 37 seconds  PROTIME-INR      Result Value Range   Prothrombin Time 12.6  11.6 - 15.2 seconds   INR 0.96  0.00 - 1.49  GLUCOSE, CAPILLARY      Result Value Range   Glucose-Capillary 93  70 - 99 mg/dL  GLUCOSE, CAPILLARY      Result Value Range   Glucose-Capillary 162 (*) 70 - 99 mg/dL  GLUCOSE, CAPILLARY      Result Value Range   Glucose-Capillary 160 (*) 70 - 99 mg/dL  GLUCOSE, CAPILLARY      Result Value Range   Glucose-Capillary 168 (*) 70 - 99 mg/dL   Comment 1 Notify RN     Comment 2 Documented in Chart    GLUCOSE, CAPILLARY      Result Value Range   Glucose-Capillary 167 (*) 70 - 99 mg/dL   Comment 1 Notify RN     Comment 2 Documented in Chart    GLUCOSE, CAPILLARY      Result Value Range   Glucose-Capillary 158 (*) 70 - 99 mg/dL   Comment 1 Notify RN     Comment 2 Documented in Chart    GLUCOSE,  CAPILLARY      Result Value Range   Glucose-Capillary 186 (*) 70 - 99 mg/dL    Chest 2 View  0/02/270   *RADIOLOGY REPORT*  Clinical Data: Lumbar spinal stenosis.  Preoperative respiratory exam.  CHEST - 2 VIEW  Comparison: 06/07/2012 and 04/09/2011  Findings: The heart size and pulmonary vascularity are normal and the lungs are clear.  No acute osseous abnormality.  IMPRESSION: No acute abnormalities.   Original Report Authenticated By: Francene Boyers, M.D.   Dg Lumbar Spine 1 View  09/14/2012   *RADIOLOGY REPORT*  Clinical Data: Laminectomy  LUMBAR SPINE - 1 VIEW  Comparison: Preoperative radiographs 09/06/2012  Findings: Single cross-table lateral radiographs demonstrate soft tissue spreaders in the posterior soft tissues at the L4-L5 level. There are three metallic markers.  The most cephalad is located at the L3-L4 apophysis.  The central probe projects over the inferior articulating process of L4, and the most inferior probe projects over the inferior articulating process of L5.  IMPRESSION: Intraoperative localization radiograph as above.   Original Report Authenticated By: Malachy Moan, M.D.   Dg Or Local Abdomen  09/14/2012   *RADIOLOGY REPORT*  Clinical Data: Missing screw  OR LOCAL ABDOMEN  Comparison: Cross-table lateral radiographs obtained  earlier today at 15 14 p.m.  Findings: Limited PA radiographs of the lumbar spine demonstrate no metallic foreign body resembling a screw.  There is multilevel degenerative disc disease.  Radiopaque snaps projects over the right aspect of the patient's, likely down.  IMPRESSION: No metallic foreign body resembling a screw identified.   Original Report Authenticated By: Malachy Moan, M.D.    Antibiotics:  Anti-infectives   Start     Dose/Rate Route Frequency Ordered Stop   09/14/12 2200  ceFAZolin (ANCEF) IVPB 1 g/50 mL premix     1 g 100 mL/hr over 30 Minutes Intravenous Every 8 hours 09/14/12 1944 09/15/12 0700   09/14/12 1429  bacitracin  81191 UNITS injection    Comments:  RATCLIFF, ESTHER: cabinet override      09/14/12 1429 09/15/12 0229   09/14/12 1414  bacitracin 50,000 Units in sodium chloride irrigation 0.9 % 500 mL irrigation  Status:  Discontinued       As needed 09/14/12 1514 09/14/12 1713   09/14/12 0600  ceFAZolin (ANCEF) IVPB 2 g/50 mL premix     2 g 100 mL/hr over 30 Minutes Intravenous On call to O.R. 09/13/12 1445 09/14/12 1440      Discharge Exam: Blood pressure 146/79, pulse 62, temperature 98.4 F (36.9 C), temperature source Oral, resp. rate 18, SpO2 99.00%. Neurologic: Grossly normal Incision cdi  Discharge Medications:     Medication List    STOP taking these medications       enoxaparin 80 MG/0.8ML injection  Commonly known as:  LOVENOX      TAKE these medications       esomeprazole 40 MG capsule  Commonly known as:  NEXIUM  Take 40 mg by mouth daily before breakfast.     imatinib 400 MG tablet  Commonly known as:  GLEEVEC  Take 400 mg by mouth daily.     isosorbide mononitrate 30 MG 24 hr tablet  Commonly known as:  IMDUR  Take 1 tablet (30 mg total) by mouth daily.     lisinopril 5 MG tablet  Commonly known as:  PRINIVIL,ZESTRIL  Take 5 mg by mouth daily.     loperamide 2 MG capsule  Commonly known as:  IMODIUM  Take 2 mg by mouth daily. For diarrhea     LORazepam 0.5 MG tablet  Commonly known as:  ATIVAN  Take 0.5 mg by mouth at bedtime as needed for anxiety. For sleep.     metFORMIN 500 MG 24 hr tablet  Commonly known as:  GLUCOPHAGE-XR  Take 500 mg by mouth every morning. TAKE 1 TABLET BY MOUTH EVERY DAY IN THE MORNING     metoprolol succinate 25 MG 24 hr tablet  Commonly known as:  TOPROL-XL  Take 25 mg by mouth daily.     multivitamin tablet  Take 1 tablet by mouth daily.     OCUVITE PO  Take 1 tablet by mouth daily.     ondansetron 4 MG tablet  Commonly known as:  ZOFRAN  Take 4 mg by mouth every 8 (eight) hours as needed for nausea.     predniSONE 5  MG tablet  Commonly known as:  DELTASONE  Take 5 mg by mouth every other day.     promethazine 25 MG tablet  Commonly known as:  PHENERGAN  Take 12.5 mg by mouth every 6 (six) hours as needed for nausea.     pyridostigmine 60 MG tablet  Commonly known as:  MESTINON  Take 30  mg by mouth 3 (three) times daily.     traMADol 50 MG tablet  Commonly known as:  ULTRAM  Take 1 tablet (50 mg total) by mouth every 4 (four) hours as needed for pain. For pain     warfarin 5 MG tablet  Commonly known as:  COUMADIN  Take 2.5-5 mg by mouth See admin instructions. 2.5 mg Monday Tuesday WednesdayThursdaySaturday Sunday, 5 mg on Friday.        Disposition: home   Final Dx: LUMBAR laminectomy      Discharge Orders   Future Appointments Provider Department Dept Phone   09/20/2012 11:00 AM Sherrie Mustache [facility] [phone]   09/20/2012 11:30 AM Ladene Artist, MD [facility] MEDICAL ONCOLOGY [phone]   09/22/2012 10:00 AM [facility] [phone]   10/04/2012 2:30 PM Tonny Bollman, MD [place] Main Office [place]) [phone]   11/24/2012 2:30 PM York Spaniel, MD [facility] [phone]   Future Orders Complete By Expires     Call MD for:  difficulty breathing, headache or visual disturbances  As directed     Call MD for:  persistant nausea and vomiting  As directed     Call MD for:  redness, tenderness, or signs of infection (pain, swelling, redness, odor or green/yellow discharge around incision site)  As directed     Call MD for:  severe uncontrolled pain  As directed     Call MD for:  temperature >100.4  As directed     Diet - low sodium heart healthy  As directed     Discharge instructions  As directed     Comments:      No driving for 1 week, may shower, no strenuous activity, resume warfarin in 2 days    Increase activity slowly  As directed         Follow-up Information   Follow up with Tia Alert, MD In 2 weeks.   Contact information:   [address] [place] [address] [phone]        Signed: Tia Alert 09/16/2012, 7:57 AM

## 2012-09-16 NOTE — Progress Notes (Signed)
Pt. Alert and oriented,follows simple instructions, denies pain. Incision area without swelling, redness or S/S of infection. Voiding adequate clear yellow urine. Moving all extremities well and vitals stable and documented. Discharged home as ordered.Lumbar surgery notes instructions given to patient and family member for home safety and precautions. Pt. and family stated understanding of instructions given. 

## 2012-09-19 ENCOUNTER — Encounter (HOSPITAL_COMMUNITY): Payer: Self-pay | Admitting: Neurological Surgery

## 2012-09-20 ENCOUNTER — Other Ambulatory Visit: Payer: Medicare Other | Admitting: Lab

## 2012-09-20 ENCOUNTER — Ambulatory Visit: Payer: Medicare Other | Admitting: Oncology

## 2012-09-21 ENCOUNTER — Other Ambulatory Visit: Payer: Self-pay | Admitting: *Deleted

## 2012-09-22 ENCOUNTER — Encounter: Payer: Self-pay | Admitting: Endocrinology

## 2012-09-22 ENCOUNTER — Ambulatory Visit (INDEPENDENT_AMBULATORY_CARE_PROVIDER_SITE_OTHER): Payer: Medicare Other | Admitting: Endocrinology

## 2012-09-22 VITALS — BP 124/80 | HR 74 | Ht 67.0 in | Wt 177.0 lb

## 2012-09-22 DIAGNOSIS — H6590 Unspecified nonsuppurative otitis media, unspecified ear: Secondary | ICD-10-CM | POA: Diagnosis not present

## 2012-09-22 MED ORDER — HYDROCORTISONE 2.5 % RE CREA: TOPICAL_CREAM | Freq: Two times a day (BID) | RECTAL | Status: DC

## 2012-09-22 MED ORDER — AMOXICILLIN-POT CLAVULANATE 875-125 MG PO TABS: 1.0000 | ORAL_TABLET | Freq: Two times a day (BID) | ORAL | Status: AC

## 2012-09-22 NOTE — Patient Instructions (Addendum)
i have sent 2 prescriptions to your pharmacy, for an antibiotic pill, and hemorrhoid cream. Also, soaking in a warm bath with epsom salts helps.   I hope you feel better soon.  If you don't feel better by next week, please call back.

## 2012-09-22 NOTE — Progress Notes (Signed)
Subjective:    Patient ID: [name], male    DOB: 04-22-1940, 72 y.o.   MRN: [id number]  HPI Pt states few days of moderate pain at the right ear, but no assoc fever.   Past Medical History  Diagnosis Date  . Coronary atherosclerosis of unspecified type of vessel, native or graft     remote cath in 2003 showing distal and branch vessel CAD with normal LV function; managed medically  . Mixed hyperlipidemia   . Glucose intolerance (impaired glucose tolerance)     history of  . Diverticulosis of colon (without mention of hemorrhage)   . Hx of colonic polyps   . Internal hemorrhoids without mention of complication   . Rash and other nonspecific skin eruption   . Pneumonia, organism unspecified   . Conjunctivitis unspecified   . Myasthenia gravis     uses prednisone  . GERD (gastroesophageal reflux disease)   . Pulmonary embolism June 2013  . DVT (deep venous thrombosis) June 2013  . Chronic anticoagulation   . Diabetes mellitus   . DJD (degenerative joint disease)   . Ptosis   . CML (chronic myeloid leukemia)   . HTN (hypertension)   . IBS (irritable bowel syndrome)   . History of pneumonia   . Dystonia   . High cholesterol   . Heart disease   . Cancer   . 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 PONV (postoperative nausea and vomiting)   . External hemorrhoids without mention of complication     blood clot left 6/13 with pe  . History of kidney stones     Past Surgical History  Procedure Laterality Date  . Angioplasty  2001  . Back surgery      x2, lumbar and cervical  . Tonsillectomy    . Cataract extraction Bilateral 12/12  . Shoulder surgery Left 05/07/2009  . Cardiac catheterization    . Lumbar laminectomy/decompression microdiscectomy Right 09/14/2012    Procedure: Right Lumbar three-four, four-five, Lumbar five-Sacral one decompressive laminectomy;  Surgeon: Tia Alert, MD;  Location: [facility];  Service: Neurosurgery;  Laterality: Right;     History   Social History  . Marital Status: Married    Spouse Name: N/A    Number of Children: 2  . Years of Education: 1-College   Occupational History  . retired     Retired   Social History Main Topics  . Smoking status: Never Smoker   . Smokeless tobacco: Never Used  . Alcohol Use: No  . Drug Use: No  . Sexual Activity: Not Currently   Other Topics Concern  . Not on file   Social History Narrative  . No narrative on file    Current Outpatient Prescriptions on File Prior to Visit  Medication Sig Dispense Refill  . esomeprazole (NEXIUM) 40 MG capsule Take 40 mg by mouth daily before breakfast.      . imatinib (GLEEVEC) 400 MG tablet Take 400 mg by mouth daily.       . isosorbide mononitrate (IMDUR) 30 MG 24 hr tablet Take 1 tablet (30 mg total) by mouth daily.  90 tablet  0  . lisinopril (PRINIVIL,ZESTRIL) 5 MG tablet Take 5 mg by mouth daily.      Marland Kitchen loperamide (IMODIUM) 2 MG capsule Take 2 mg by mouth daily. For diarrhea      . LORazepam (ATIVAN) 0.5 MG tablet Take 0.5 mg by mouth at bedtime as needed for anxiety. For sleep.      Marland Kitchen  metFORMIN (GLUCOPHAGE-XR) 500 MG 24 hr tablet Take 500 mg by mouth every morning. TAKE 1 TABLET BY MOUTH EVERY DAY IN THE MORNING      . metoprolol succinate (TOPROL-XL) 25 MG 24 hr tablet Take 25 mg by mouth daily.      . Multiple Vitamin (MULTIVITAMIN) tablet Take 1 tablet by mouth daily.      . Multiple Vitamins-Minerals (OCUVITE PO) Take 1 tablet by mouth daily.      . ondansetron (ZOFRAN) 4 MG tablet Take 4 mg by mouth every 8 (eight) hours as needed for nausea.      . predniSONE (DELTASONE) 5 MG tablet Take 5 mg by mouth every other day.      . promethazine (PHENERGAN) 25 MG tablet Take 12.5 mg by mouth every 6 (six) hours as needed for nausea.      Marland Kitchen pyridostigmine (MESTINON) 60 MG tablet Take 30 mg by mouth 3 (three) times daily.      . traMADol (ULTRAM) 50 MG tablet Take 1 tablet (50 mg total) by mouth every 4 (four) hours as  needed for pain. For pain  60 tablet  1  . warfarin (COUMADIN) 5 MG tablet Take 2.5-5 mg by mouth See admin instructions. 2.5 mg Monday Tuesday WednesdayThursdaySaturday Sunday, 5 mg on Friday.      . [DISCONTINUED] zolpidem (AMBIEN) 10 MG tablet Take 10 mg by mouth at bedtime as needed.        No current facility-administered medications on file prior to visit.    Allergies  Allergen Reactions  . Oxycodone-Acetaminophen Other (See Comments)    Went "out of head"  . Sulfamethoxazole Rash    Family History  Problem Relation Age of Onset  . Heart disease Mother   . Heart attack Mother   . Stroke Mother   . Heart disease Father   . Heart attack Father   . Stroke Father   . Breast cancer Sister     Twin   . Heart attack Sister   . Dementia Sister   . Heart disease Sister   . Heart attack Sister   . Clotting disorder Sister   . Heart attack Sister    BP 124/80  Pulse 74  Ht 5\' 7"  (1.702 m)  Wt 177 lb (80.287 kg)  BMI 27.72 kg/m2  SpO2 96%  Review of Systems Denies nasal congestion.  He has recurrence of his chronic hemorrhoidal sxs.    Objective:   Physical Exam VITAL SIGNS:  See vs page GENERAL: no distress head: no deformity eyes: no periorbital swelling, no proptosis external nose and ears are normal mouth: no lesion seen Right tm: slight erythema (left is normal)     Assessment & Plan:  AOM, new

## 2012-09-23 ENCOUNTER — Telehealth: Payer: Self-pay | Admitting: Oncology

## 2012-09-23 NOTE — Telephone Encounter (Signed)
S/W THE PT REGARDING HIS LAB APPT THAT HE MISSED. PER THE PT HE WAS IN THE HOSP ON 09/14/2012 AND HAD LABS DRAWN THEN. HE WANTED TO KNOW WILL HE STILL NEED TO HAVE LABS DRAWN. S/W DR Truett Perna WHO LOOKED THIS INFORMATION UP AND HE STATED THAT THIS PT DOES NOT NEED ANY LABS WILL ONLY NEED A F/U APPT. PER SUSAN SHE WILL ENTER THE [facility] FOR THE F/U VISIT,. ADVISED THE PT THAT HE WILL ONLY NEED A F/U APPT NO LABS AT THIS TIME.

## 2012-09-27 ENCOUNTER — Other Ambulatory Visit: Payer: Medicare Other | Admitting: Lab

## 2012-09-27 ENCOUNTER — Ambulatory Visit (INDEPENDENT_AMBULATORY_CARE_PROVIDER_SITE_OTHER): Payer: Medicare Other | Admitting: Pharmacist

## 2012-09-27 DIAGNOSIS — I2699 Other pulmonary embolism without acute cor pulmonale: Secondary | ICD-10-CM | POA: Diagnosis not present

## 2012-09-27 DIAGNOSIS — Z7901 Long term (current) use of anticoagulants: Secondary | ICD-10-CM | POA: Diagnosis not present

## 2012-09-27 LAB — POCT INR: INR: 1.2

## 2012-09-28 ENCOUNTER — Telehealth: Payer: Self-pay | Admitting: Gastroenterology

## 2012-09-28 NOTE — Telephone Encounter (Signed)
Pt having difficulty with his hemorrhoid. Requesting to be seen. Pt scheduled to see Amy Esterwood PA 10/03/12@9 :30am. Pt aware of appt.

## 2012-09-29 ENCOUNTER — Telehealth: Payer: Self-pay | Admitting: Oncology

## 2012-09-29 NOTE — Telephone Encounter (Signed)
gave pt appt on 10/13 lab and MD ,date and time per MD

## 2012-10-03 ENCOUNTER — Ambulatory Visit: Payer: Medicare Other | Admitting: Physician Assistant

## 2012-10-04 ENCOUNTER — Ambulatory Visit (INDEPENDENT_AMBULATORY_CARE_PROVIDER_SITE_OTHER): Payer: Medicare Other | Admitting: Cardiovascular Disease

## 2012-10-04 ENCOUNTER — Encounter: Payer: Self-pay | Admitting: Cardiovascular Disease

## 2012-10-04 ENCOUNTER — Ambulatory Visit (INDEPENDENT_AMBULATORY_CARE_PROVIDER_SITE_OTHER): Payer: Medicare Other | Admitting: Pharmacist

## 2012-10-04 VITALS — BP 118/68 | HR 70 | Ht 67.0 in | Wt 177.0 lb

## 2012-10-04 DIAGNOSIS — I2510 Atherosclerotic heart disease of native coronary artery without angina pectoris: Secondary | ICD-10-CM

## 2012-10-04 DIAGNOSIS — I2699 Other pulmonary embolism without acute cor pulmonale: Secondary | ICD-10-CM

## 2012-10-04 DIAGNOSIS — E7800 Pure hypercholesterolemia, unspecified: Secondary | ICD-10-CM

## 2012-10-04 DIAGNOSIS — Z7901 Long term (current) use of anticoagulants: Secondary | ICD-10-CM

## 2012-10-04 LAB — POCT INR: INR: 1.4

## 2012-10-04 NOTE — Progress Notes (Signed)
HPI:  72 year old gentleman presenting for followup evaluation. He has been followed by Dr. Riley Kill. The patient has coronary artery disease and has undergone remote PCI. He had a treadmill study in March of this year that showed no significant EKG changes. At the time he was having constant atypical chest pain.  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  From a cardiac standpoint he is doing okay. He had lumbar surgery 3 weeks ago. He had no perioperative cardiac problems. He denies chest pain, chest pressure, palpitations, or leg swelling. He awoke yesterday with pain at the base of his left thumb. He's been taking Tylenol and using heat. That part of his hand has been swollen. He has no other complaints today.  Outpatient Encounter Prescriptions as of 10/04/2012  Medication Sig Dispense Refill  . acetaminophen (TYLENOL) 325 MG tablet Take 650 mg by mouth every 6 (six) hours as needed for pain.      Marland Kitchen esomeprazole (NEXIUM) 40 MG capsule Take 40 mg by mouth daily before breakfast.      . hydrocortisone (PROCTOSOL HC) 2.5 % rectal cream Place rectally 2 (two) times daily.  30 g  0  . imatinib (GLEEVEC) 400 MG tablet Take 400 mg by mouth daily.       . isosorbide mononitrate (IMDUR) 30 MG 24 hr tablet Take 1 tablet (30 mg total) by mouth daily.  90 tablet  0  . lisinopril (PRINIVIL,ZESTRIL) 5 MG tablet Take 5 mg by mouth daily.      Marland Kitchen loperamide (IMODIUM) 2 MG capsule Take 2 mg by mouth daily. For diarrhea      . LORazepam (ATIVAN) 0.5 MG tablet Take 0.5 mg by mouth at bedtime as needed for anxiety. For sleep.      . metFORMIN (GLUCOPHAGE-XR) 500 MG 24 hr tablet Take 500 mg by mouth every morning. TAKE 1 TABLET BY MOUTH EVERY DAY IN THE MORNING      . metoprolol succinate (TOPROL-XL) 25 MG 24 hr tablet Take 25 mg by mouth daily.      . Multiple Vitamin (MULTIVITAMIN) tablet Take 1 tablet by mouth daily.      . Multiple  Vitamins-Minerals (OCUVITE PO) Take 1 tablet by mouth daily.      . ondansetron (ZOFRAN) 4 MG tablet Take 4 mg by mouth every 8 (eight) hours as needed for nausea.      . predniSONE (DELTASONE) 5 MG tablet Take 5 mg by mouth every other day.      . promethazine (PHENERGAN) 25 MG tablet Take 12.5 mg by mouth every 6 (six) hours as needed for nausea.      Marland Kitchen pyridostigmine (MESTINON) 60 MG tablet Take 30 mg by mouth daily.       . traMADol (ULTRAM) 50 MG tablet Take 1 tablet (50 mg total) by mouth every 4 (four) hours as needed for pain. For pain  60 tablet  1  . warfarin (COUMADIN) 5 MG tablet Take 2.5-5 mg by mouth See admin instructions. 2.5 mg Monday Tuesday WednesdayThursdaySaturday Sunday, 5 mg on Friday.       No facility-administered encounter medications on file as of 10/04/2012.    Allergies  Allergen Reactions  . Oxycodone-Acetaminophen Other (See Comments)    Went "out of head"  . Sulfamethoxazole Rash    Past Medical History  Diagnosis Date  . Coronary atherosclerosis of unspecified type of vessel, native or graft     remote cath  in 2003 showing distal and branch vessel CAD with normal LV function; managed medically  . Mixed hyperlipidemia   . Glucose intolerance (impaired glucose tolerance)     history of  . Diverticulosis of colon (without mention of hemorrhage)   . Hx of colonic polyps   . Internal hemorrhoids without mention of complication   . Rash and other nonspecific skin eruption   . Pneumonia, organism unspecified   . Conjunctivitis unspecified   . Myasthenia gravis     uses prednisone  . GERD (gastroesophageal reflux disease)   . Pulmonary embolism June 2013  . DVT (deep venous thrombosis) June 2013  . Chronic anticoagulation   . Diabetes mellitus   . DJD (degenerative joint disease)   . Ptosis   . CML (chronic myeloid leukemia)   . HTN (hypertension)   . IBS (irritable bowel syndrome)   . History of pneumonia   . Dystonia   . High cholesterol   .  Heart disease   . Cancer   . 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 PONV (postoperative nausea and vomiting)   . External hemorrhoids without mention of complication     blood clot left 6/13 with pe  . History of kidney stones     ROS: Negative except as per HPI  BP 118/68  Pulse 70  Ht 5\' 7"  (1.702 m)  Wt 177 lb (80.287 kg)  BMI 27.72 kg/m2  SpO2 95%  PHYSICAL EXAM: Pt is alert and oriented, NAD HEENT: normal Neck: JVP - normal, carotids 2+= without bruits Lungs: CTA bilaterally CV: RRR without murmur or gallop Abd: soft, NT, Positive BS, no hepatomegaly Ext: no C/C/E, distal pulses intact and equal, the base of the left thumb over the thenar eminence is tender and swollen. Skin: warm/dry no rash  ASSESSMENT AND PLAN: 1. Coronary artery disease, native vessel. The patient is stable without anginal symptoms. He remains on warfarin for anticoagulation, so he is not on antiplatelet therapy. He will return in 6 months for followup. His other medications were reviewed and they are appropriate.  2. Hypertension. Blood pressure is controlled on a combination of lisinopril, isosorbide, and metoprolol succinate.  3. Hyperlipidemia. The patient is on Crestor 5 mg. He is due for repeat lipids and LFTs.  4. Left thumb/hand pain. Wonder if this is acute gout. Advised continue Tylenol and ice. Advised him to followup with his primary physician as soon as possible if symptoms are not improved. I did not recommend nonsteroidal anti-inflammatories because of his use of prednisone and Coumadin.  Tonny Bollman 10/04/2012 3:12 PM

## 2012-10-04 NOTE — Patient Instructions (Addendum)
Your physician recommends that you continue on your current medications as directed. Please refer to the Current Medication list given to you today.  Your physician recommends that you return for a FASTING LIPID and LIVER PROFILE--nothing to eat or drink after midnight, lab opens at 7:30   Your physician wants you to follow-up in: 6 MONTHS with Dr Excell Seltzer.  You will receive a reminder letter in the mail two months in advance. If you don't receive a letter, please call our office to schedule the follow-up appointment.

## 2012-10-05 ENCOUNTER — Encounter: Payer: Self-pay | Admitting: Cardiovascular Disease

## 2012-10-12 ENCOUNTER — Ambulatory Visit (INDEPENDENT_AMBULATORY_CARE_PROVIDER_SITE_OTHER): Payer: Medicare Other | Admitting: *Deleted

## 2012-10-12 ENCOUNTER — Other Ambulatory Visit (INDEPENDENT_AMBULATORY_CARE_PROVIDER_SITE_OTHER): Payer: Medicare Other

## 2012-10-12 DIAGNOSIS — E7800 Pure hypercholesterolemia, unspecified: Secondary | ICD-10-CM

## 2012-10-12 DIAGNOSIS — I2510 Atherosclerotic heart disease of native coronary artery without angina pectoris: Secondary | ICD-10-CM

## 2012-10-12 DIAGNOSIS — I2699 Other pulmonary embolism without acute cor pulmonale: Secondary | ICD-10-CM

## 2012-10-12 DIAGNOSIS — Z7901 Long term (current) use of anticoagulants: Secondary | ICD-10-CM | POA: Diagnosis not present

## 2012-10-12 LAB — POCT INR: INR: 1.8

## 2012-10-12 LAB — HEPATIC FUNCTION PANEL
ALT: 22 U/L (ref 0–53)
AST: 27 U/L (ref 0–37)
Albumin: 3.8 g/dL (ref 3.5–5.2)
Alkaline Phosphatase: 55 U/L (ref 39–117)
Total Protein: 6.1 g/dL (ref 6.0–8.3)

## 2012-10-12 LAB — LIPID PANEL
Cholesterol: 137 mg/dL (ref 0–200)
Total CHOL/HDL Ratio: 4
Triglycerides: 255 mg/dL — ABNORMAL HIGH (ref 0.0–149.0)

## 2012-10-13 ENCOUNTER — Encounter: Payer: Self-pay | Admitting: Gastroenterology

## 2012-10-14 ENCOUNTER — Other Ambulatory Visit: Payer: Self-pay | Admitting: Gastroenterology

## 2012-10-14 MED ORDER — HYDROCORTISONE ACETATE 25 MG RE SUPP: 25.0000 mg | Freq: Two times a day (BID) | RECTAL | Status: DC

## 2012-10-18 DIAGNOSIS — B354 Tinea corporis: Secondary | ICD-10-CM | POA: Diagnosis not present

## 2012-10-18 DIAGNOSIS — L570 Actinic keratosis: Secondary | ICD-10-CM | POA: Diagnosis not present

## 2012-10-18 DIAGNOSIS — B351 Tinea unguium: Secondary | ICD-10-CM | POA: Diagnosis not present

## 2012-10-19 ENCOUNTER — Encounter: Payer: Self-pay | Admitting: Oncology

## 2012-10-19 ENCOUNTER — Encounter: Payer: Self-pay | Admitting: Endocrinology

## 2012-10-20 ENCOUNTER — Other Ambulatory Visit: Payer: Self-pay | Admitting: Endocrinology

## 2012-10-20 MED ORDER — CANAGLIFLOZIN 300 MG PO TABS: 1.0000 | ORAL_TABLET | Freq: Every day | ORAL | Status: DC

## 2012-10-26 ENCOUNTER — Ambulatory Visit (INDEPENDENT_AMBULATORY_CARE_PROVIDER_SITE_OTHER): Payer: Medicare Other | Admitting: *Deleted

## 2012-10-26 ENCOUNTER — Other Ambulatory Visit: Payer: Self-pay | Admitting: Endocrinology

## 2012-10-26 ENCOUNTER — Encounter: Payer: Self-pay | Admitting: Endocrinology

## 2012-10-26 DIAGNOSIS — Z23 Encounter for immunization: Secondary | ICD-10-CM

## 2012-10-26 DIAGNOSIS — Z7901 Long term (current) use of anticoagulants: Secondary | ICD-10-CM

## 2012-10-26 DIAGNOSIS — I2699 Other pulmonary embolism without acute cor pulmonale: Secondary | ICD-10-CM | POA: Diagnosis not present

## 2012-10-28 DIAGNOSIS — Z7901 Long term (current) use of anticoagulants: Secondary | ICD-10-CM | POA: Diagnosis not present

## 2012-10-28 DIAGNOSIS — Z86711 Personal history of pulmonary embolism: Secondary | ICD-10-CM | POA: Diagnosis not present

## 2012-10-28 DIAGNOSIS — N179 Acute kidney failure, unspecified: Secondary | ICD-10-CM | POA: Insufficient documentation

## 2012-10-28 DIAGNOSIS — C9211 Chronic myeloid leukemia, BCR/ABL-positive, in remission: Secondary | ICD-10-CM | POA: Diagnosis not present

## 2012-10-31 ENCOUNTER — Encounter: Payer: Self-pay | Admitting: Neurology

## 2012-10-31 DIAGNOSIS — Q762 Congenital spondylolisthesis: Secondary | ICD-10-CM | POA: Diagnosis not present

## 2012-10-31 DIAGNOSIS — M48061 Spinal stenosis, lumbar region without neurogenic claudication: Secondary | ICD-10-CM | POA: Diagnosis not present

## 2012-11-02 ENCOUNTER — Telehealth: Payer: Self-pay | Admitting: *Deleted

## 2012-11-02 NOTE — Telephone Encounter (Signed)
Received note from pt's dermatologist recommending pt begin Lamisil if not contraindicated while taking Gleevec. Per Dr. Truett Perna and pharmacy review, OK to take Lamisil. Left message on voicemail for pt, faxed note to Dr. Fatima Blank with this info.

## 2012-11-04 ENCOUNTER — Encounter: Payer: Self-pay | Admitting: Endocrinology

## 2012-11-04 DIAGNOSIS — Z23 Encounter for immunization: Secondary | ICD-10-CM

## 2012-11-07 ENCOUNTER — Other Ambulatory Visit: Payer: Self-pay | Admitting: Otolaryngology

## 2012-11-07 ENCOUNTER — Telehealth: Payer: Self-pay | Admitting: Neurology

## 2012-11-07 DIAGNOSIS — K118 Other diseases of salivary glands: Secondary | ICD-10-CM

## 2012-11-07 NOTE — Telephone Encounter (Signed)
I called patient. The patient is having increased problems with ptosis of the left and right eyes. No double vision. No problems with chewing or swallowing or breathing. The patient in the past has not tolerated higher doses of Mestinon secondary to stomach upset. The patient will try taking 30 mg 3 or 4 times a day. If this is either not tolerated, or is not effective, we may need to put him on low-dose prednisone, but he does have a history of diabetes. The patient will call back if further problems are noted.

## 2012-11-08 ENCOUNTER — Ambulatory Visit (INDEPENDENT_AMBULATORY_CARE_PROVIDER_SITE_OTHER): Payer: Medicare Other

## 2012-11-08 DIAGNOSIS — N179 Acute kidney failure, unspecified: Secondary | ICD-10-CM | POA: Diagnosis not present

## 2012-11-08 DIAGNOSIS — I2699 Other pulmonary embolism without acute cor pulmonale: Secondary | ICD-10-CM | POA: Diagnosis not present

## 2012-11-08 DIAGNOSIS — Z7901 Long term (current) use of anticoagulants: Secondary | ICD-10-CM | POA: Diagnosis not present

## 2012-11-08 LAB — POCT INR: INR: 2.7

## 2012-11-10 ENCOUNTER — Ambulatory Visit
Admission: RE | Admit: 2012-11-10 | Discharge: 2012-11-10 | Disposition: A | Discharge status: Home / Self Care | Payer: Medicare Other | Source: Ambulatory Visit | Attending: Otolaryngology | Admitting: Otolaryngology

## 2012-11-10 DIAGNOSIS — K118 Other diseases of salivary glands: Secondary | ICD-10-CM

## 2012-11-11 ENCOUNTER — Other Ambulatory Visit: Payer: Self-pay | Admitting: Cardiology

## 2012-11-11 ENCOUNTER — Other Ambulatory Visit: Payer: Self-pay | Admitting: Gastroenterology

## 2012-11-11 ENCOUNTER — Other Ambulatory Visit: Payer: Self-pay | Admitting: Endocrinology

## 2012-11-14 ENCOUNTER — Telehealth: Payer: Self-pay | Admitting: Neurology

## 2012-11-14 ENCOUNTER — Other Ambulatory Visit: Payer: Self-pay | Admitting: Otolaryngology

## 2012-11-14 DIAGNOSIS — R220 Localized swelling, mass and lump, head: Secondary | ICD-10-CM | POA: Diagnosis not present

## 2012-11-14 NOTE — Telephone Encounter (Signed)
I called patient. The patient is having ongoing problems with ptosis of the eyelid. The patient has diabetes, and the prednisone was started he would have to be in low dose. I have not yet seen the patient, as he was followed by Dr. love. I'll get a revisit set up.

## 2012-11-15 ENCOUNTER — Other Ambulatory Visit: Payer: Self-pay

## 2012-11-15 MED ORDER — METOPROLOL SUCCINATE ER 25 MG PO TB24: 25.0000 mg | ORAL_TABLET | Freq: Every day | ORAL | Status: DC

## 2012-11-15 MED ORDER — ISOSORBIDE MONONITRATE ER 30 MG PO TB24: 30.0000 mg | ORAL_TABLET | Freq: Every day | ORAL | Status: DC

## 2012-11-15 MED ORDER — LISINOPRIL 5 MG PO TABS: 5.0000 mg | ORAL_TABLET | Freq: Every day | ORAL | Status: DC

## 2012-11-18 ENCOUNTER — Telehealth: Payer: Self-pay | Admitting: Neurology

## 2012-11-19 MED ORDER — PREDNISONE 5 MG PO TABS: 10.0000 mg | ORAL_TABLET | Freq: Every day | ORAL | Status: DC

## 2012-11-19 NOTE — Telephone Encounter (Signed)
I called the patient. He is having ongoing issues with diplopia and ptosis. I will get him started on prednisone, and he will be seen in the office next week.

## 2012-11-21 ENCOUNTER — Ambulatory Visit (HOSPITAL_BASED_OUTPATIENT_CLINIC_OR_DEPARTMENT_OTHER): Payer: Medicare Other | Admitting: Oncology

## 2012-11-21 ENCOUNTER — Telehealth: Payer: Self-pay | Admitting: Oncology

## 2012-11-21 ENCOUNTER — Other Ambulatory Visit (HOSPITAL_BASED_OUTPATIENT_CLINIC_OR_DEPARTMENT_OTHER): Payer: Medicare Other

## 2012-11-21 VITALS — BP 120/52 | HR 61 | Temp 98.3°F | Resp 20 | Ht 67.0 in | Wt 184.6 lb

## 2012-11-21 DIAGNOSIS — Z7901 Long term (current) use of anticoagulants: Secondary | ICD-10-CM | POA: Diagnosis not present

## 2012-11-21 DIAGNOSIS — R110 Nausea: Secondary | ICD-10-CM

## 2012-11-21 DIAGNOSIS — R197 Diarrhea, unspecified: Secondary | ICD-10-CM | POA: Diagnosis not present

## 2012-11-21 DIAGNOSIS — C9210 Chronic myeloid leukemia, BCR/ABL-positive, not having achieved remission: Secondary | ICD-10-CM | POA: Diagnosis not present

## 2012-11-21 DIAGNOSIS — I82409 Acute embolism and thrombosis of unspecified deep veins of unspecified lower extremity: Secondary | ICD-10-CM

## 2012-11-21 LAB — CBC WITH DIFFERENTIAL/PLATELET
BASO%: 0.4 % (ref 0.0–2.0)
Eosinophils Absolute: 0.1 10*3/uL (ref 0.0–0.5)
HCT: 35.8 % — ABNORMAL LOW (ref 38.4–49.9)
HGB: 12.4 g/dL — ABNORMAL LOW (ref 13.0–17.1)
LYMPH%: 11 % — ABNORMAL LOW (ref 14.0–49.0)
MCH: 31.3 pg (ref 27.2–33.4)
MCHC: 34.5 g/dL (ref 32.0–36.0)
MCV: 90.7 fL (ref 79.3–98.0)
MONO%: 5.8 % (ref 0.0–14.0)
NEUT%: 82.2 % — ABNORMAL HIGH (ref 39.0–75.0)
Platelets: 231 10*3/uL (ref 140–400)
RBC: 3.95 10*6/uL — ABNORMAL LOW (ref 4.20–5.82)

## 2012-11-21 MED ORDER — ONDANSETRON HCL 4 MG PO TABS: 4.0000 mg | ORAL_TABLET | Freq: Two times a day (BID) | ORAL | Status: DC | PRN

## 2012-11-21 MED ORDER — LORAZEPAM 1 MG PO TABS: 1.0000 mg | ORAL_TABLET | Freq: Every day | ORAL | Status: DC

## 2012-11-21 NOTE — Telephone Encounter (Signed)
gv and printed appt sched and avs for pt for JUNE 2015 °

## 2012-11-21 NOTE — Progress Notes (Signed)
   [facility]    OFFICE PROGRESS NOTE   INTERVAL HISTORY:   Mr. 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 No recent problems nausea or diarrhea. He underwent a lumbar decompressive laminectomy in August. He reports improvement in the leg pain following this procedure. He has undergone an evaluation at [facility] for a right parotid mass. An FNA biopsy in November 2013 revealed no malignancy. He reports a recent flare of the myasthenia gravis with increased eye lid weakness. He is now taking prednisone.  Objective:  Vital signs in last 24 hours:  Blood pressure 120/52, pulse 61, temperature 98.3 F (36.8 C), temperature source Oral, resp. rate 20, height 5\' 7"  (1.702 m), weight 184 lb 9.6 oz (83.734 kg).    HEENT: Neck without mass. Oropharynx without visible mass, mild peri-orbital edema Resp: Lungs clear bilaterally Cardio: Regular rate and rhythm GI: No hepatosplenomegaly Vascular: Trace ankle edema bilaterally Neurologic: Left eyelid droop  Skin: No rash   Lab Results:  Lab Results  Component Value Date   WBC 9.8 11/21/2012   HGB 12.4* 11/21/2012   HCT 35.8* 11/21/2012   MCV 90.7 11/21/2012   PLT 231 11/21/2012   ANC 8.1    Medications: I have reviewed the patient's current medications.  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 He sees Dr. Lowell Guitar for PCR monitoring. 2. Intermittent nausea and diarrhea, likely related to Gleevec. 3. History of periorbital and extremity edema secondary to Gleevec. 4. Myasthenia gravis-followed by Dr. Anne Hahn 5. Diabetes 6. History of coronary artery disease 7. Deep vein thrombosis/pulmonary embolism in June 2013-now maintained on Coumadin, managed at the [facility] Coumadin clinic   Disposition:  He remains in hematologic remission from the [facility]. Mr. Carrick will continue molecular monitoring with Dr. Lowell Guitar. He will return for an  office visit here in 8 months. We refilled a prescription for Ativan today.   Thornton Papas, MD  11/21/2012  12:14 PM

## 2012-11-24 ENCOUNTER — Ambulatory Visit (INDEPENDENT_AMBULATORY_CARE_PROVIDER_SITE_OTHER): Payer: Medicare Other | Admitting: Pharmacist

## 2012-11-24 ENCOUNTER — Encounter: Payer: Self-pay | Admitting: Neurology

## 2012-11-24 ENCOUNTER — Ambulatory Visit (INDEPENDENT_AMBULATORY_CARE_PROVIDER_SITE_OTHER): Payer: Medicare Other | Admitting: Neurology

## 2012-11-24 VITALS — BP 118/67 | HR 66 | Wt 179.0 lb

## 2012-11-24 DIAGNOSIS — I2699 Other pulmonary embolism without acute cor pulmonale: Secondary | ICD-10-CM

## 2012-11-24 DIAGNOSIS — G7000 Myasthenia gravis without (acute) exacerbation: Secondary | ICD-10-CM | POA: Diagnosis not present

## 2012-11-24 DIAGNOSIS — Z7901 Long term (current) use of anticoagulants: Secondary | ICD-10-CM | POA: Diagnosis not present

## 2012-11-24 LAB — POCT INR: INR: 3

## 2012-11-24 MED ORDER — PYRIDOSTIGMINE BROMIDE 60 MG PO TABS: 60.0000 mg | ORAL_TABLET | Freq: Three times a day (TID) | ORAL | Status: DC

## 2012-11-24 NOTE — Progress Notes (Signed)
Reason for visit: Ocular myasthenia gravis  [name] is an 72 y.o. male  History of present illness:  [name] is a 72 year old right-handed white male with a history of ocular myasthenia gravis. The patient underwent lumbosacral spine surgery in August of 2014, and within a month after surgery, his myasthenia gravis has worsened. The patient has had increased ptosis, and some double vision, and he will note that the left eye will occasionally close down completely. The patient has been on Mestinon, but he has not been able to tolerate the medication well, secondary to nausea. The patient indicates that he is taking a single 90 mg dose of Mestinon in the morning. The patient has recently been placed on prednisone, currently on 10 mg daily. Within a week of therapy, the patient has already gained improvement with the ptosis and the double vision. The prednisone has eliminated the double vision, and the ptosis has gotten better. The patient denies any weakness of the extremities, and he denies problems with speech or swallowing or with breathing. The patient comes to this office for further evaluation. His low back pain issues have improved following the lumbosacral spine surgery.  Past Medical History  Diagnosis Date  . Coronary atherosclerosis of unspecified type of vessel, native or graft     remote cath in 2003 showing distal and branch vessel CAD with normal LV function; managed medically  . Mixed hyperlipidemia   . Glucose intolerance (impaired glucose tolerance)     history of  . Diverticulosis of colon (without mention of hemorrhage)   . Hx of colonic polyps   . Internal hemorrhoids without mention of complication   . Rash and other nonspecific skin eruption   . Pneumonia, organism unspecified   . Conjunctivitis unspecified   . Myasthenia gravis     uses prednisone  . GERD (gastroesophageal reflux disease)   . Pulmonary embolism June 2013  . DVT (deep venous thrombosis) June  2013  . Chronic anticoagulation   . Diabetes mellitus   . DJD (degenerative joint disease)   . Ptosis   . CML (chronic myeloid leukemia)   . HTN (hypertension)   . IBS (irritable bowel syndrome)   . History of pneumonia   . Dystonia   . High cholesterol   . Heart disease   . Cancer   . 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 PONV (postoperative nausea and vomiting)   . External hemorrhoids without mention of complication     blood clot left 6/13 with pe  . History of kidney stones     Past Surgical History  Procedure Laterality Date  . Angioplasty  2001  . Back surgery      x2, lumbar and cervical  . Tonsillectomy    . Cataract extraction Bilateral 12/12  . Shoulder surgery Left 05/07/2009  . Cardiac catheterization    . Lumbar laminectomy/decompression microdiscectomy Right 09/14/2012    Procedure: Right Lumbar three-four, four-five, Lumbar five-Sacral one decompressive laminectomy;  Surgeon: Tia Alert, MD;  Location: [facility];  Service: Neurosurgery;  Laterality: Right;    Family History  Problem Relation Age of Onset  . Heart disease Mother   . Heart attack Mother   . Stroke Mother   . Heart disease Father   . Heart attack Father   . Stroke Father   . Breast cancer Sister     Twin   . Heart attack Sister   . Dementia Sister   . Heart  disease Sister   . Heart attack Sister   . Clotting disorder Sister   . Heart attack Sister     Social history:  reports that he has never smoked. He has never used smokeless tobacco. He reports that he does not drink alcohol or use illicit drugs.    Allergies  Allergen Reactions  . Oxycodone-Acetaminophen Other (See Comments)    Went "out of head"  . Oxycodone-Aspirin Rash    Unknown   . Sulfamethoxazole Rash and Itching    Medications:  Current Outpatient Prescriptions on File Prior to Visit  Medication Sig Dispense Refill  . acetaminophen (TYLENOL) 325 MG tablet Take 650 mg by mouth every 6 (six) hours  as needed for pain.      Marland Kitchen imatinib (GLEEVEC) 400 MG tablet Take 400 mg by mouth daily.       . isosorbide mononitrate (IMDUR) 30 MG 24 hr tablet Take 1 tablet (30 mg total) by mouth daily.  90 tablet  3  . lisinopril (PRINIVIL,ZESTRIL) 5 MG tablet Take 1 tablet (5 mg total) by mouth daily.  90 tablet  3  . loperamide (IMODIUM) 2 MG capsule Take 2 mg by mouth daily. For diarrhea      . LORazepam (ATIVAN) 1 MG tablet Take 1 tablet (1 mg total) by mouth at bedtime. As needed  30 tablet  1  . metFORMIN (GLUCOPHAGE-XR) 500 MG 24 hr tablet TAKE 1 TABLET BY MOUTH EVERY DAY IN THE MORNING  90 tablet  0  . metoprolol succinate (TOPROL-XL) 25 MG 24 hr tablet Take 1 tablet (25 mg total) by mouth daily.  30 tablet  6  . Multiple Vitamin (MULTIVITAMIN) tablet Take 1 tablet by mouth daily.      . Multiple Vitamins-Minerals (OCUVITE PO) Take 1 tablet by mouth daily.      Marland Kitchen NEXIUM 40 MG capsule TAKE 1 CAPSULE EVERY MORNING  90 capsule  3  . ondansetron (ZOFRAN) 4 MG tablet Take 1 tablet (4 mg total) by mouth every 12 (twelve) hours as needed for nausea.  20 tablet  2  . predniSONE (DELTASONE) 5 MG tablet Take 2 tablets (10 mg total) by mouth daily.  60 tablet  1  . promethazine (PHENERGAN) 25 MG tablet Take 12.5 mg by mouth every 6 (six) hours as needed for nausea.      . rosuvastatin (CRESTOR) 10 MG tablet Take 0.5 tablets (5 mg total) by mouth at bedtime.  1 tablet  0  . traMADol (ULTRAM) 50 MG tablet Take 1 tablet (50 mg total) by mouth every 4 (four) hours as needed for pain. For pain  60 tablet  1  . warfarin (COUMADIN) 5 MG tablet TAKE 1 TABLET EVERY DAY AS DIRECTED  30 tablet  3  . [DISCONTINUED] zolpidem (AMBIEN) 10 MG tablet Take 10 mg by mouth at bedtime as needed.        No current facility-administered medications on file prior to visit.    ROS:  Out of a complete 14 system review of symptoms, the patient complains only of the following symptoms, and all other reviewed systems are  negative.  Blurred vision Diarrhea Ptosis  Blood pressure 118/67, pulse 66, weight 179 lb (81.194 kg).  Physical Exam  General: The patient is alert and cooperative at the time of the examination.  Skin: No significant peripheral edema is noted.   Neurologic Exam  Cranial nerves: Facial symmetry is present. Speech is normal, no aphasia or dysarthria is noted. Extraocular  movements are full. Visual fields are full. Mild bilateral ptosis is seen. With superior gaze for 1 minute, there is a minimal increase in ptosis of the left eye. The patient has good strength with jaw opening and closure, head turning and shoulder shrug bilaterally.  Motor: The patient has good strength in all 4 extremities. With the arms abducted for 1 minute, no fatigable weakness of the deltoid muscles is seen.  Coordination: The patient has good finger-nose-finger and heel-to-shin bilaterally.  Gait and station: The patient has a normal gait. Tandem gait is slightly unsteady. Romberg is negative. No drift is seen.  Reflexes: Deep tendon reflexes are symmetric.   Assessment/Plan:  One. Ocular myasthenia gravis  The patient is doing much better on a low dose of prednisone, 10 mg daily. The patient will be maintained on this dose for at least 2 months. If the patient seems to be doing well at that point, a slow taper will be initiated. The patient is to alter the way he is taking his Mestinon, taking 30 mg every 3 or 4 hours during the day as needed. The patient will followup through this office in about 4 months.  Marlan Palau MD 11/24/2012 8:41 PM  [facility] [address] [place], [place] [address]  Phone [phone] Fax [phone]

## 2012-12-09 ENCOUNTER — Other Ambulatory Visit: Payer: Self-pay

## 2012-12-09 MED ORDER — PREDNISONE 5 MG PO TABS: 10.0000 mg | ORAL_TABLET | Freq: Every day | ORAL | Status: DC

## 2012-12-09 NOTE — Telephone Encounter (Signed)
I called patient 

## 2012-12-12 ENCOUNTER — Telehealth: Payer: Self-pay | Admitting: Neurology

## 2012-12-12 DIAGNOSIS — H547 Unspecified visual loss: Secondary | ICD-10-CM | POA: Diagnosis not present

## 2012-12-13 ENCOUNTER — Telehealth: Payer: Self-pay | Admitting: Neurology

## 2012-12-13 MED ORDER — PREDNISONE 5 MG PO TABS: 15.0000 mg | ORAL_TABLET | Freq: Every day | ORAL | Status: DC

## 2012-12-13 NOTE — Telephone Encounter (Signed)
I called patient. The patient has had worsening of his eye problems, increased ptosis or double vision. The patient went up on the prednisone taking 15 mg daily, and he feels better on this dose. We will continue the prednisone at 15 mg dose for now. I will call prescription in for him.

## 2012-12-15 ENCOUNTER — Ambulatory Visit (INDEPENDENT_AMBULATORY_CARE_PROVIDER_SITE_OTHER): Payer: Medicare Other | Admitting: General Practice

## 2012-12-15 DIAGNOSIS — Z7901 Long term (current) use of anticoagulants: Secondary | ICD-10-CM

## 2012-12-15 DIAGNOSIS — I2699 Other pulmonary embolism without acute cor pulmonale: Secondary | ICD-10-CM | POA: Diagnosis not present

## 2012-12-15 LAB — POCT INR: INR: 2.9

## 2012-12-20 DIAGNOSIS — R220 Localized swelling, mass and lump, head: Secondary | ICD-10-CM | POA: Diagnosis not present

## 2012-12-20 NOTE — Telephone Encounter (Signed)
Completed in another note.

## 2012-12-27 DIAGNOSIS — N400 Enlarged prostate without lower urinary tract symptoms: Secondary | ICD-10-CM | POA: Diagnosis not present

## 2012-12-27 DIAGNOSIS — N139 Obstructive and reflux uropathy, unspecified: Secondary | ICD-10-CM | POA: Diagnosis not present

## 2012-12-27 DIAGNOSIS — N401 Enlarged prostate with lower urinary tract symptoms: Secondary | ICD-10-CM | POA: Diagnosis not present

## 2012-12-27 DIAGNOSIS — N138 Other obstructive and reflux uropathy: Secondary | ICD-10-CM | POA: Diagnosis not present

## 2013-01-09 ENCOUNTER — Ambulatory Visit (INDEPENDENT_AMBULATORY_CARE_PROVIDER_SITE_OTHER): Payer: Medicare Other | Admitting: *Deleted

## 2013-01-09 ENCOUNTER — Encounter: Payer: Self-pay | Admitting: Endocrinology

## 2013-01-09 ENCOUNTER — Ambulatory Visit (INDEPENDENT_AMBULATORY_CARE_PROVIDER_SITE_OTHER): Payer: Medicare Other | Admitting: Endocrinology

## 2013-01-09 VITALS — BP 128/86 | HR 63 | Temp 98.0°F | Ht 67.0 in | Wt 183.2 lb

## 2013-01-09 DIAGNOSIS — Z7901 Long term (current) use of anticoagulants: Secondary | ICD-10-CM | POA: Diagnosis not present

## 2013-01-09 DIAGNOSIS — I2699 Other pulmonary embolism without acute cor pulmonale: Secondary | ICD-10-CM

## 2013-01-09 DIAGNOSIS — C9210 Chronic myeloid leukemia, BCR/ABL-positive, not having achieved remission: Secondary | ICD-10-CM

## 2013-01-09 DIAGNOSIS — E119 Type 2 diabetes mellitus without complications: Secondary | ICD-10-CM | POA: Diagnosis not present

## 2013-01-09 LAB — URINALYSIS, ROUTINE W REFLEX MICROSCOPIC
Bilirubin Urine: NEGATIVE
Hgb urine dipstick: NEGATIVE
Ketones, ur: NEGATIVE
Leukocytes, UA: NEGATIVE
Nitrite: NEGATIVE
Specific Gravity, Urine: 1.025
Total Protein, Urine: NEGATIVE
Urine Glucose: NEGATIVE
Urobilinogen, UA: 0.2
pH: 6 (ref 5.0–8.0)

## 2013-01-09 LAB — BASIC METABOLIC PANEL
BUN: 16 mg/dL (ref 6–23)
CO2: 28 mEq/L (ref 19–32)
Chloride: 102 mEq/L (ref 96–112)
Creatinine, Ser: 1.4 mg/dL (ref 0.4–1.5)
Glucose, Bld: 172 mg/dL — ABNORMAL HIGH (ref 70–99)
Potassium: 4.3 mEq/L (ref 3.5–5.1)

## 2013-01-09 LAB — POCT INR: INR: 3.4

## 2013-01-09 LAB — HEMOGLOBIN A1C: Hgb A1c MFr Bld: 6.1 % (ref 4.6–6.5)

## 2013-01-09 MED ORDER — CEFUROXIME AXETIL 500 MG PO TABS: 500.0000 mg | ORAL_TABLET | Freq: Two times a day (BID) | ORAL | Status: DC

## 2013-01-09 NOTE — Progress Notes (Signed)
Subjective:    Patient ID: [name], male    DOB: 13-Jun-1940, 72 y.o.   MRN: [id number]  HPI Pt sees dr at [facility] for swelling at the right parotid area.  He has bx, and was rx'ed abx.  However, he now reports 1 month of slight swelling at the bilat inguinal areas, and assoc pain Past Medical History  Diagnosis Date  . Coronary atherosclerosis of unspecified type of vessel, native or graft     remote cath in 2003 showing distal and branch vessel CAD with normal LV function; managed medically  . Mixed hyperlipidemia   . Glucose intolerance (impaired glucose tolerance)     history of  . Diverticulosis of colon (without mention of hemorrhage)   . Hx of colonic polyps   . Internal hemorrhoids without mention of complication   . Rash and other nonspecific skin eruption   . Pneumonia, organism unspecified   . Conjunctivitis unspecified   . Myasthenia gravis     uses prednisone  . GERD (gastroesophageal reflux disease)   . Pulmonary embolism June 2013  . DVT (deep venous thrombosis) June 2013  . Chronic anticoagulation   . Diabetes mellitus   . DJD (degenerative joint disease)   . Ptosis   . CML (chronic myeloid leukemia)   . HTN (hypertension)   . IBS (irritable bowel syndrome)   . History of pneumonia   . Dystonia   . High cholesterol   . Heart disease   . Cancer   . 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 PONV (postoperative nausea and vomiting)   . External hemorrhoids without mention of complication     blood clot left 6/13 with pe  . History of kidney stones     Past Surgical History  Procedure Laterality Date  . Angioplasty  2001  . Back surgery      x2, lumbar and cervical  . Tonsillectomy    . Cataract extraction Bilateral 12/12  . Shoulder surgery Left 05/07/2009  . Cardiac catheterization    . Lumbar laminectomy/decompression microdiscectomy Right 09/14/2012    Procedure: Right Lumbar three-four, four-five, Lumbar five-Sacral one decompressive  laminectomy;  Surgeon: Tia Alert, MD;  Location: [facility];  Service: Neurosurgery;  Laterality: Right;    History   Social History  . Marital Status: Married    Spouse Name: N/A    Number of Children: 2  . Years of Education: 1-College   Occupational History  . retired     Retired   Social History Main Topics  . Smoking status: Never Smoker   . Smokeless tobacco: Never Used  . Alcohol Use: No  . Drug Use: No  . Sexual Activity: Not Currently   Other Topics Concern  . Not on file   Social History Narrative  . No narrative on file    Current Outpatient Prescriptions on File Prior to Visit  Medication Sig Dispense Refill  . acetaminophen (TYLENOL) 325 MG tablet Take 650 mg by mouth every 6 (six) hours as needed for pain.      Marland Kitchen esomeprazole (NEXIUM) 40 MG capsule Take 40 mg by mouth daily.      Marland Kitchen ibuprofen (ADVIL,MOTRIN) 800 MG tablet Take 800 mg by mouth as needed.      . imatinib (GLEEVEC) 400 MG tablet Take 400 mg by mouth daily.       . isosorbide mononitrate (IMDUR) 30 MG 24 hr tablet Take 1 tablet (30 mg total) by mouth  daily.  90 tablet  3  . lisinopril (PRINIVIL,ZESTRIL) 5 MG tablet Take 1 tablet (5 mg total) by mouth daily.  90 tablet  3  . loperamide (IMODIUM) 2 MG capsule Take 2 mg by mouth daily. For diarrhea      . LORazepam (ATIVAN) 1 MG tablet Take 1 tablet (1 mg total) by mouth at bedtime. As needed  30 tablet  1  . metFORMIN (GLUCOPHAGE-XR) 500 MG 24 hr tablet TAKE 1 TABLET BY MOUTH EVERY DAY IN THE MORNING  90 tablet  0  . metoprolol succinate (TOPROL-XL) 25 MG 24 hr tablet Take 1 tablet (25 mg total) by mouth daily.  30 tablet  6  . Multiple Vitamin (MULTIVITAMIN) tablet Take 1 tablet by mouth daily.      . Multiple Vitamins-Minerals (OCUVITE PO) Take 1 tablet by mouth daily.      . ondansetron (ZOFRAN) 4 MG tablet Take 1 tablet (4 mg total) by mouth every 12 (twelve) hours as needed for nausea.  20 tablet  2  . promethazine (PHENERGAN) 25 MG tablet  Take 12.5 mg by mouth every 6 (six) hours as needed for nausea.      Marland Kitchen pyridostigmine (MESTINON) 60 MG tablet Take 1 tablet (60 mg total) by mouth 3 (three) times daily.  90 tablet  5  . rosuvastatin (CRESTOR) 10 MG tablet Take 0.5 tablets (5 mg total) by mouth at bedtime.  1 tablet  0  . traMADol (ULTRAM) 50 MG tablet Take 1 tablet (50 mg total) by mouth every 4 (four) hours as needed for pain. For pain  60 tablet  1  . warfarin (COUMADIN) 5 MG tablet TAKE 1 TABLET EVERY DAY AS DIRECTED  30 tablet  3  . [DISCONTINUED] zolpidem (AMBIEN) 10 MG tablet Take 10 mg by mouth at bedtime as needed.        No current facility-administered medications on file prior to visit.    Allergies  Allergen Reactions  . Oxycodone-Acetaminophen Other (See Comments)    Went "out of head"  . Oxycodone-Aspirin Rash    Unknown   . Sulfamethoxazole Rash and Itching    Family History  Problem Relation Age of Onset  . Heart disease Mother   . Heart attack Mother   . Stroke Mother   . Heart disease Father   . Heart attack Father   . Stroke Father   . Breast cancer Sister     Twin   . Heart attack Sister   . Dementia Sister   . Heart disease Sister   . Heart attack Sister   . Clotting disorder Sister   . Heart attack Sister     BP 128/86  Pulse 63  Temp(Src) 98 F (36.7 C) (Oral)  Ht 5\' 7"  (1.702 m)  Wt 183 lb 4 oz (83.122 kg)  BMI 28.69 kg/m2  SpO2 97%  Review of Systems Denies fever and rash.  Denies abd pain and n/v.     Objective:   Physical Exam VITAL SIGNS:  See vs page GENERAL: no distress Inguinal areas: slight bilat tender lymphadenopathy.      Assessment & Plan:  Lymphadenopathy: new, uncertain etiology Parotiditis: uncertain relationship to the inguinal problem. DM: well-controlled.

## 2013-01-09 NOTE — Patient Instructions (Addendum)
i have sent a prescription to your pharmacy, for an antibiotic pill.  I hope you feel better soon.  If you don't feel better by later this week, please call back.  The next step is a CT scan. Please come back for a "medicare wellness" appointment in 3 months.

## 2013-01-10 ENCOUNTER — Encounter: Payer: Self-pay | Admitting: Endocrinology

## 2013-01-10 DIAGNOSIS — R220 Localized swelling, mass and lump, head: Secondary | ICD-10-CM | POA: Diagnosis not present

## 2013-01-11 ENCOUNTER — Other Ambulatory Visit: Payer: Self-pay | Admitting: Endocrinology

## 2013-01-11 DIAGNOSIS — R590 Localized enlarged lymph nodes: Secondary | ICD-10-CM | POA: Insufficient documentation

## 2013-01-16 ENCOUNTER — Telehealth: Payer: Self-pay

## 2013-01-16 NOTE — Telephone Encounter (Signed)
i don't have priviliges to order at [facility].  Can't the dr there order.

## 2013-01-16 NOTE — Telephone Encounter (Signed)
Called patient back. Patient stated that the Dr. At [facility] was doing a Scan of the soft tissues of his neck not the lower abdomen. Instructed patient that Dr. Everardo All could not order with [facility] and patient stated that he would check and see with [facility] about getting the other orders done.

## 2013-01-16 NOTE — Telephone Encounter (Signed)
Patient called stating that he is scheduled for a CAT scan on 01/18/13 at [facility] with Dr. Nida Boatman. Patient wanted to see if referral could be placed there?  Please advise,  Thanks!

## 2013-01-18 ENCOUNTER — Other Ambulatory Visit: Payer: Self-pay | Admitting: Cardiology

## 2013-01-18 DIAGNOSIS — J320 Chronic maxillary sinusitis: Secondary | ICD-10-CM | POA: Diagnosis not present

## 2013-01-18 DIAGNOSIS — R220 Localized swelling, mass and lump, head: Secondary | ICD-10-CM | POA: Diagnosis not present

## 2013-01-18 DIAGNOSIS — K118 Other diseases of salivary glands: Secondary | ICD-10-CM | POA: Diagnosis not present

## 2013-01-19 ENCOUNTER — Inpatient Hospital Stay: Admission: RE | Admit: 2013-01-19 | Payer: Medicare Other | Source: Ambulatory Visit

## 2013-01-24 ENCOUNTER — Ambulatory Visit (INDEPENDENT_AMBULATORY_CARE_PROVIDER_SITE_OTHER): Payer: Medicare Other | Admitting: Pharmacist

## 2013-01-24 DIAGNOSIS — I2699 Other pulmonary embolism without acute cor pulmonale: Secondary | ICD-10-CM

## 2013-01-24 DIAGNOSIS — Z7901 Long term (current) use of anticoagulants: Secondary | ICD-10-CM

## 2013-01-24 DIAGNOSIS — B354 Tinea corporis: Secondary | ICD-10-CM | POA: Diagnosis not present

## 2013-01-24 DIAGNOSIS — C44319 Basal cell carcinoma of skin of other parts of face: Secondary | ICD-10-CM | POA: Diagnosis not present

## 2013-01-24 DIAGNOSIS — Z5181 Encounter for therapeutic drug level monitoring: Secondary | ICD-10-CM | POA: Diagnosis not present

## 2013-01-24 DIAGNOSIS — L570 Actinic keratosis: Secondary | ICD-10-CM | POA: Diagnosis not present

## 2013-01-24 DIAGNOSIS — B351 Tinea unguium: Secondary | ICD-10-CM | POA: Diagnosis not present

## 2013-01-27 DIAGNOSIS — C9211 Chronic myeloid leukemia, BCR/ABL-positive, in remission: Secondary | ICD-10-CM | POA: Diagnosis not present

## 2013-02-03 DIAGNOSIS — H1130 Conjunctival hemorrhage, unspecified eye: Secondary | ICD-10-CM | POA: Diagnosis not present

## 2013-02-03 DIAGNOSIS — H40009 Preglaucoma, unspecified, unspecified eye: Secondary | ICD-10-CM | POA: Diagnosis not present

## 2013-02-09 ENCOUNTER — Encounter: Payer: Self-pay | Admitting: Endocrinology

## 2013-02-10 ENCOUNTER — Other Ambulatory Visit: Payer: Self-pay | Admitting: Endocrinology

## 2013-02-10 DIAGNOSIS — R590 Localized enlarged lymph nodes: Secondary | ICD-10-CM

## 2013-02-14 ENCOUNTER — Ambulatory Visit (INDEPENDENT_AMBULATORY_CARE_PROVIDER_SITE_OTHER): Payer: Medicare Other | Admitting: *Deleted

## 2013-02-14 DIAGNOSIS — Z7901 Long term (current) use of anticoagulants: Secondary | ICD-10-CM | POA: Diagnosis not present

## 2013-02-14 DIAGNOSIS — I2699 Other pulmonary embolism without acute cor pulmonale: Secondary | ICD-10-CM | POA: Diagnosis not present

## 2013-02-14 DIAGNOSIS — Z5181 Encounter for therapeutic drug level monitoring: Secondary | ICD-10-CM | POA: Diagnosis not present

## 2013-02-14 LAB — POCT INR: INR: 2.1

## 2013-02-15 ENCOUNTER — Ambulatory Visit (INDEPENDENT_AMBULATORY_CARE_PROVIDER_SITE_OTHER)
Admission: RE | Admit: 2013-02-15 | Discharge: 2013-02-15 | Disposition: A | Discharge status: Home / Self Care | Payer: Medicare Other | Source: Ambulatory Visit | Attending: Endocrinology | Admitting: Endocrinology

## 2013-02-15 DIAGNOSIS — R590 Localized enlarged lymph nodes: Secondary | ICD-10-CM

## 2013-02-15 DIAGNOSIS — R599 Enlarged lymph nodes, unspecified: Secondary | ICD-10-CM | POA: Diagnosis not present

## 2013-02-15 DIAGNOSIS — K5730 Diverticulosis of large intestine without perforation or abscess without bleeding: Secondary | ICD-10-CM | POA: Diagnosis not present

## 2013-02-15 MED ORDER — IOHEXOL 300 MG/ML  SOLN
100.0000 mL | Freq: Once | INTRAMUSCULAR | Status: AC | PRN
  Administered 2013-02-15: 100 mL via INTRAVENOUS

## 2013-02-16 DIAGNOSIS — H40009 Preglaucoma, unspecified, unspecified eye: Secondary | ICD-10-CM | POA: Diagnosis not present

## 2013-03-04 ENCOUNTER — Other Ambulatory Visit: Payer: Self-pay | Admitting: Endocrinology

## 2013-03-05 ENCOUNTER — Other Ambulatory Visit: Payer: Self-pay | Admitting: Cardiovascular Disease

## 2013-03-14 ENCOUNTER — Ambulatory Visit (INDEPENDENT_AMBULATORY_CARE_PROVIDER_SITE_OTHER): Payer: Medicare Other | Admitting: *Deleted

## 2013-03-14 DIAGNOSIS — I2699 Other pulmonary embolism without acute cor pulmonale: Secondary | ICD-10-CM | POA: Diagnosis not present

## 2013-03-14 DIAGNOSIS — Z5181 Encounter for therapeutic drug level monitoring: Secondary | ICD-10-CM | POA: Diagnosis not present

## 2013-03-14 DIAGNOSIS — Z7901 Long term (current) use of anticoagulants: Secondary | ICD-10-CM

## 2013-03-14 DIAGNOSIS — I82409 Acute embolism and thrombosis of unspecified deep veins of unspecified lower extremity: Secondary | ICD-10-CM | POA: Diagnosis not present

## 2013-03-14 LAB — POCT INR: INR: 4

## 2013-03-27 DIAGNOSIS — C44319 Basal cell carcinoma of skin of other parts of face: Secondary | ICD-10-CM | POA: Diagnosis not present

## 2013-03-27 DIAGNOSIS — L570 Actinic keratosis: Secondary | ICD-10-CM | POA: Diagnosis not present

## 2013-03-27 DIAGNOSIS — B351 Tinea unguium: Secondary | ICD-10-CM | POA: Diagnosis not present

## 2013-03-28 ENCOUNTER — Ambulatory Visit: Payer: Medicare Other | Admitting: Neurology

## 2013-04-03 ENCOUNTER — Ambulatory Visit (INDEPENDENT_AMBULATORY_CARE_PROVIDER_SITE_OTHER): Payer: Medicare Other | Admitting: Pharmacist

## 2013-04-03 DIAGNOSIS — Z7901 Long term (current) use of anticoagulants: Secondary | ICD-10-CM

## 2013-04-03 DIAGNOSIS — Z5181 Encounter for therapeutic drug level monitoring: Secondary | ICD-10-CM

## 2013-04-03 DIAGNOSIS — I82409 Acute embolism and thrombosis of unspecified deep veins of unspecified lower extremity: Secondary | ICD-10-CM | POA: Diagnosis not present

## 2013-04-03 DIAGNOSIS — I2699 Other pulmonary embolism without acute cor pulmonale: Secondary | ICD-10-CM | POA: Diagnosis not present

## 2013-04-03 LAB — POCT INR: INR: 2.2

## 2013-04-12 DIAGNOSIS — J189 Pneumonia, unspecified organism: Secondary | ICD-10-CM | POA: Diagnosis not present

## 2013-04-20 ENCOUNTER — Other Ambulatory Visit: Payer: Self-pay | Admitting: Cardiovascular Disease

## 2013-04-24 ENCOUNTER — Encounter: Payer: Self-pay | Admitting: Endocrinology

## 2013-04-24 ENCOUNTER — Ambulatory Visit (INDEPENDENT_AMBULATORY_CARE_PROVIDER_SITE_OTHER): Payer: Medicare Other | Admitting: *Deleted

## 2013-04-24 ENCOUNTER — Ambulatory Visit (INDEPENDENT_AMBULATORY_CARE_PROVIDER_SITE_OTHER): Payer: Medicare Other | Admitting: Endocrinology

## 2013-04-24 VITALS — BP 124/80 | HR 67 | Temp 98.2°F | Ht 67.0 in | Wt 186.0 lb

## 2013-04-24 DIAGNOSIS — I10 Essential (primary) hypertension: Secondary | ICD-10-CM | POA: Diagnosis not present

## 2013-04-24 DIAGNOSIS — J189 Pneumonia, unspecified organism: Secondary | ICD-10-CM

## 2013-04-24 DIAGNOSIS — IMO0002 Reserved for concepts with insufficient information to code with codable children: Secondary | ICD-10-CM

## 2013-04-24 DIAGNOSIS — E119 Type 2 diabetes mellitus without complications: Secondary | ICD-10-CM

## 2013-04-24 DIAGNOSIS — E782 Mixed hyperlipidemia: Secondary | ICD-10-CM | POA: Diagnosis not present

## 2013-04-24 DIAGNOSIS — Z79899 Other long term (current) drug therapy: Secondary | ICD-10-CM | POA: Diagnosis not present

## 2013-04-24 DIAGNOSIS — Z7901 Long term (current) use of anticoagulants: Secondary | ICD-10-CM

## 2013-04-24 DIAGNOSIS — I82409 Acute embolism and thrombosis of unspecified deep veins of unspecified lower extremity: Secondary | ICD-10-CM

## 2013-04-24 DIAGNOSIS — I2699 Other pulmonary embolism without acute cor pulmonale: Secondary | ICD-10-CM | POA: Diagnosis not present

## 2013-04-24 DIAGNOSIS — Z125 Encounter for screening for malignant neoplasm of prostate: Secondary | ICD-10-CM

## 2013-04-24 DIAGNOSIS — R229 Localized swelling, mass and lump, unspecified: Secondary | ICD-10-CM

## 2013-04-24 DIAGNOSIS — Z5181 Encounter for therapeutic drug level monitoring: Secondary | ICD-10-CM

## 2013-04-24 LAB — BASIC METABOLIC PANEL
BUN: 11 mg/dL (ref 6–23)
CALCIUM: 9.2 mg/dL (ref 8.4–10.5)
CO2: 29 mEq/L (ref 19–32)
Chloride: 103 mEq/L (ref 96–112)
Creat: 1.36 mg/dL — ABNORMAL HIGH (ref 0.50–1.35)
Glucose, Bld: 91 mg/dL (ref 70–99)
Potassium: 4.1 mEq/L (ref 3.5–5.3)
Sodium: 141 mEq/L (ref 135–145)

## 2013-04-24 LAB — HEMOGLOBIN A1C
Hgb A1c MFr Bld: 5.6 % (ref <5.7)
Mean Plasma Glucose: 114 mg/dL (ref <117)

## 2013-04-24 LAB — HEPATIC FUNCTION PANEL
ALT: 27 U/L (ref 0–53)
AST: 28 U/L (ref 0–37)
Albumin: 4 g/dL (ref 3.5–5.2)
Alkaline Phosphatase: 45 U/L (ref 39–117)
BILIRUBIN TOTAL: 0.8 mg/dL (ref 0.2–1.2)
Bilirubin, Direct: 0.1 mg/dL (ref 0.0–0.3)
Indirect Bilirubin: 0.7 mg/dL (ref 0.2–1.2)
Total Protein: 5.8 g/dL — ABNORMAL LOW (ref 6.0–8.3)

## 2013-04-24 LAB — LIPID PANEL
CHOL/HDL RATIO: 3.4 ratio
Cholesterol: 128 mg/dL (ref 0–200)
HDL: 38 mg/dL — ABNORMAL LOW (ref >39)
LDL CALC: 49 mg/dL (ref 0–99)
Triglycerides: 205 mg/dL — ABNORMAL HIGH (ref <150)
VLDL: 41 mg/dL — AB (ref 0–40)

## 2013-04-24 LAB — POCT INR: INR: 2.1

## 2013-04-24 MED ORDER — NITROFURANTOIN MACROCRYSTAL 100 MG PO CAPS
100.0000 mg | ORAL_CAPSULE | Freq: Four times a day (QID) | ORAL | Status: DC
Start: 2013-04-24 — End: 2013-05-11

## 2013-04-24 NOTE — Patient Instructions (Addendum)
i have sent a prescription to your pharmacy, for an antibiotic pill.  I hope you feel better soon.  If you don't feel better by next week, please call back, so I can ask your urologist to see you sooner.  please consider these measures for your health:  minimize alcohol.  do not use tobacco products.  have a colonoscopy at least every 10 years from age 75.  keep firearms safely stored.  always use seat belts.  have working smoke alarms in your home.  see an eye doctor and dentist regularly.  never drive under the influence of alcohol or drugs (including prescription drugs).  those with fair skin should take precautions against the sun.  it is critically important to prevent falling down (keep floor areas well-lit, dry, and free of loose objects.  If you have a cane, walker, or wheelchair, you should use it, even for short trips around the house.  Also, try not to rush).   Marland Kitchengood diet and exercise habits significanly improve the control of your diabetes.  please let me know if you wish to be referred to a dietician.  high blood sugar is very risky to your health.  you should see an eye doctor every year.  You are at higher than average risk for pneumonia and hepatitis-B.  You should be vaccinated against both.   Please come back for a follow-up appointment in 6 months.

## 2013-04-24 NOTE — Progress Notes (Signed)
Subjective:    Patient ID: [name], male    DOB: June 09, 1940, 73 y.o.   MRN: [id number]  HPI The state of at least three ongoing medical problems is addressed today, with interval history of each noted here: Pt [facility] "[facility]chial [facility]" [facility] [place].  He just finished abx, and pt states he feels better in general.  Pt says systolic bp was 94 when he was ill.   DM: he denies weight change.   Past Medical History  Diagnosis Date  . Coronary atherosclerosis of unspecified type of vessel, native or graft     remote cath in 2003 showing distal and branch vessel CAD with normal LV function; managed medically  . Mixed hyperlipidemia   . Glucose intolerance (impaired glucose tolerance)     history of  . Diverticulosis of colon (without mention of hemorrhage)   . Hx of colonic polyps   . Internal hemorrhoids without mention of complication   . Rash and other nonspecific skin eruption   . Pneumonia, organism unspecified   . Conjunctivitis unspecified   . Myasthenia gravis     uses prednisone  . GERD (gastroesophageal reflux disease)   . Pulmonary embolism June 2013  . DVT (deep venous thrombosis) June 2013  . Chronic anticoagulation   . Diabetes mellitus   . DJD (degenerative joint disease)   . Ptosis   . CML (chronic myeloid leukemia)   . HTN (hypertension)   . IBS (irritable bowel syndrome)   . History of pneumonia   . Dystonia   . High cholesterol   . Heart disease   . Cancer   . 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 PONV (postoperative nausea and vomiting)   . External hemorrhoids without mention of complication     blood clot left 6/13 with pe  . History of kidney stones     Past Surgical History  Procedure Laterality Date  . Angioplasty  2001  . Back surgery      x2, lumbar and cervical  . Tonsillectomy    . Cataract extraction Bilateral 12/12  . Shoulder surgery Left 05/07/2009  . Cardiac catheterization    . Lumbar  laminectomy/decompression microdiscectomy Right 09/14/2012    Procedure: Right Lumbar three-four, four-five, Lumbar five-Sacral one decompressive laminectomy;  Surgeon: [name], MD;  Location: [facility];  Service: Neurosurgery;  Laterality: Right;    History   Social History  . Marital Status: Married    Spouse Name: N/A    Number of Children: 2  . Years of Education: 1-College   Occupational History  . retired     Retired   Social History Main Topics  . Smoking status: Never Smoker   . Smokeless tobacco: Never Used  . Alcohol Use: No  . Drug Use: No  . Sexual Activity: Not Currently   Other Topics Concern  . Not on file   Social History Narrative  . No narrative on file    Current Outpatient Prescriptions on File Prior to Visit  Medication Sig Dispense Refill  . CRESTOR 10 MG tablet TAKE 1/2 TABLET BY MOUTH DAILY  45 tablet  0  . esomeprazole (NEXIUM) 40 MG capsule Take 40 mg by mouth daily.      Marland Kitchen ibuprofen (ADVIL,MOTRIN) 800 MG tablet Take 800 mg by mouth as needed.      . imatinib (GLEEVEC) 400 MG tablet Take 400 mg by mouth daily.       Marland Kitchen  isosorbide mononitrate (IMDUR) 30 MG 24 hr tablet Take 1 tablet (30 mg total) by mouth daily.  90 tablet  3  . lisinopril (PRINIVIL,ZESTRIL) 5 MG tablet Take 1 tablet (5 mg total) by mouth daily.  90 tablet  3  . loperamide (IMODIUM) 2 MG capsule Take 2 mg by mouth daily. For diarrhea      . LORazepam (ATIVAN) 1 MG tablet Take 1 tablet (1 mg total) by mouth at bedtime. As needed  30 tablet  1  . metFORMIN (GLUCOPHAGE-XR) 500 MG 24 hr tablet TAKE 1 TABLET BY MOUTH EVERY DAY IN THE MORNING  90 tablet  0  . metoprolol succinate (TOPROL-XL) 25 MG 24 hr tablet Take 1 tablet (25 mg total) by mouth daily.  30 tablet  6  . Multiple Vitamin (MULTIVITAMIN) tablet Take 1 tablet by mouth daily.      . Multiple Vitamins-Minerals (OCUVITE PO) Take 1 tablet by mouth daily.      . ondansetron (ZOFRAN) 4 MG tablet Take 1 tablet (4 mg total) by  mouth every 12 (twelve) hours as needed for nausea.  20 tablet  2  . predniSONE (DELTASONE) 5 MG tablet Take 20 mg by mouth daily.      . promethazine (PHENERGAN) 25 MG tablet Take 12.5 mg by mouth every 6 (six) hours as needed for nausea.      Marland Kitchen pyridostigmine (MESTINON) 60 MG tablet Take 1 tablet (60 mg total) by mouth 3 (three) times daily.  90 tablet  5  . rosuvastatin (CRESTOR) 10 MG tablet Take 0.5 tablets (5 mg total) by mouth at bedtime.  1 tablet  0  . traMADol (ULTRAM) 50 MG tablet Take 1 tablet (50 mg total) by mouth every 4 (four) hours as needed for pain. For pain  60 tablet  1  . warfarin (COUMADIN) 5 MG tablet TAKE 1 TABLET EVERY DAY AS DIRECTED  30 tablet  3  . [DISCONTINUED] zolpidem (AMBIEN) 10 MG tablet Take 10 mg by mouth at bedtime as needed.        No current facility-administered medications on file prior to visit.    Allergies  Allergen Reactions  . Oxycodone-Acetaminophen Other (See Comments)    Went "out of head"  . Oxycodone-Acetaminophen Other (See Comments)    Went "out of head"  . Oxycodone-Aspirin Rash    Unknown  Unknown   . Sulfamethoxazole Rash and Itching    Family History  Problem Relation Age of Onset  . Heart disease Mother   . Heart attack Mother   . Stroke Mother   . Heart disease Father   . Heart attack Father   . Stroke Father   . Breast cancer Sister     Twin   . Heart attack Sister   . Dementia Sister   . Heart disease Sister   . Heart attack Sister   . Clotting disorder Sister   . Heart attack Sister     BP 124/80  Pulse 67  Temp(Src) 98.2 F (36.8 C) (Oral)  Ht 5\' 7"  (1.702 m)  Wt 186 lb (84.369 kg)  BMI 29.12 kg/m2  SpO2 96%  Review of Systems Denies sob.  Cough is improved.  bilat inguinal pain persists.      Objective:   Physical Exam VITAL SIGNS:  See vs page GENERAL: no distress LUNGS:  Clear to auscultation   Lab Results  Component Value Date   WBC 9.8 11/21/2012   HGB 12.4* 11/21/2012   HCT 35.8*  11/21/2012   PLT 231 11/21/2012   GLUCOSE 91 04/24/2013   CHOL 128 04/24/2013   TRIG 205* 04/24/2013   HDL 38* 04/24/2013   LDLDIRECT 59.8 10/12/2012   LDLCALC 49 04/24/2013   ALT 27 04/24/2013   AST 28 04/24/2013   NA 141 04/24/2013   K 4.1 04/24/2013   CL 103 04/24/2013   CREATININE 1.36* 04/24/2013   BUN 11 04/24/2013   CO2 29 04/24/2013   TSH 1.875 04/24/2013   PSA 3.93 04/24/2013   INR 2.1 04/24/2013   HGBA1C 5.6 04/24/2013   MICROALBUR 2.63* 04/24/2013      Assessment & Plan:  DM: well-controlled Pneumonia: clinically improved Hypotension: due to acute illness--improved Renal insuff: stable Inguinal pain, persistent, uncertain etiology    Subjective:     Patient here for Medicare annual wellness visit and management of other chronic and acute problems.     Risk factors: advanced age    48 of Physicians Providing Medical Care to Patient:  See "snapshot"   Activities of Daily Living: In your present state of health, do you have any difficulty performing the following activities?:  Preparing food and eating?: No  Bathing yourself: No  Getting dressed: No  Using the toilet:No  Moving around from place to place: No  In the past year have you fallen or had a near fall?:No    Home Safety: Has smoke detector and wears seat belts. Firearms are safely stored. No excess sun exposure.  Diet and Exercise  Current exercise habits: pt says good Dietary issues discussed: pt reports a healthy diet   Depression Screen  Q1: Over the past two weeks, have you felt down, depressed or hopeless?no  Q2: Over the past two weeks, have you felt little interest or pleasure in doing things? no   The following portions of the patient's history were reviewed and updated as appropriate: allergies, current medications, past family history, past medical history, past social history, past surgical history and problem list.  Past Medical History  Diagnosis Date  . Coronary atherosclerosis of  unspecified type of vessel, native or graft     remote cath in 2003 showing distal and branch vessel CAD with normal LV function; managed medically  . Mixed hyperlipidemia   . Glucose intolerance (impaired glucose tolerance)     history of  . Diverticulosis of colon (without mention of hemorrhage)   . Hx of colonic polyps   . Internal hemorrhoids without mention of complication   . Rash and other nonspecific skin eruption   . Pneumonia, organism unspecified   . Conjunctivitis unspecified   . Myasthenia gravis     uses prednisone  . GERD (gastroesophageal reflux disease)   . Pulmonary embolism June 2013  . DVT (deep venous thrombosis) June 2013  . Chronic anticoagulation   . Diabetes mellitus   . DJD (degenerative joint disease)   . Ptosis   . CML (chronic myeloid leukemia)   . HTN (hypertension)   . IBS (irritable bowel syndrome)   . History of pneumonia   . Dystonia   . High cholesterol   . Heart disease   . Cancer   . 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 PONV (postoperative nausea and vomiting)   . External hemorrhoids without mention of complication     blood clot left 6/13 with pe  . History of kidney stones     Past Surgical History  Procedure Laterality Date  . Angioplasty  2001  . Back surgery  x2, lumbar and cervical  . Tonsillectomy    . Cataract extraction Bilateral 12/12  . Shoulder surgery Left 05/07/2009  . Cardiac catheterization    . Lumbar laminectomy/decompression microdiscectomy Right 09/14/2012    Procedure: Right Lumbar three-four, four-five, Lumbar five-Sacral one decompressive laminectomy;  Surgeon: [name], MD;  Location: [facility];  Service: Neurosurgery;  Laterality: Right;    History   Social History  . Marital Status: Married    Spouse Name: N/A    Number of Children: 2  . Years of Education: 1-College   Occupational History  . retired     Retired   Social History Main Topics  . Smoking status: Never Smoker     . Smokeless tobacco: Never Used  . Alcohol Use: No  . Drug Use: No  . Sexual Activity: Not Currently   Other Topics Concern  . Not on file   Social History Narrative  . No narrative on file    Current Outpatient Prescriptions on File Prior to Visit  Medication Sig Dispense Refill  . CRESTOR 10 MG tablet TAKE 1/2 TABLET BY MOUTH DAILY  45 tablet  0  . esomeprazole (NEXIUM) 40 MG capsule Take 40 mg by mouth daily.      Marland Kitchen ibuprofen (ADVIL,MOTRIN) 800 MG tablet Take 800 mg by mouth as needed.      . imatinib (GLEEVEC) 400 MG tablet Take 400 mg by mouth daily.       . isosorbide mononitrate (IMDUR) 30 MG 24 hr tablet Take 1 tablet (30 mg total) by mouth daily.  90 tablet  3  . lisinopril (PRINIVIL,ZESTRIL) 5 MG tablet Take 1 tablet (5 mg total) by mouth daily.  90 tablet  3  . loperamide (IMODIUM) 2 MG capsule Take 2 mg by mouth daily. For diarrhea      . LORazepam (ATIVAN) 1 MG tablet Take 1 tablet (1 mg total) by mouth at bedtime. As needed  30 tablet  1  . metFORMIN (GLUCOPHAGE-XR) 500 MG 24 hr tablet TAKE 1 TABLET BY MOUTH EVERY DAY IN THE MORNING  90 tablet  0  . metoprolol succinate (TOPROL-XL) 25 MG 24 hr tablet Take 1 tablet (25 mg total) by mouth daily.  30 tablet  6  . Multiple Vitamin (MULTIVITAMIN) tablet Take 1 tablet by mouth daily.      . Multiple Vitamins-Minerals (OCUVITE PO) Take 1 tablet by mouth daily.      . ondansetron (ZOFRAN) 4 MG tablet Take 1 tablet (4 mg total) by mouth every 12 (twelve) hours as needed for nausea.  20 tablet  2  . predniSONE (DELTASONE) 5 MG tablet Take 20 mg by mouth daily.      . promethazine (PHENERGAN) 25 MG tablet Take 12.5 mg by mouth every 6 (six) hours as needed for nausea.      Marland Kitchen pyridostigmine (MESTINON) 60 MG tablet Take 1 tablet (60 mg total) by mouth 3 (three) times daily.  90 tablet  5  . rosuvastatin (CRESTOR) 10 MG tablet Take 0.5 tablets (5 mg total) by mouth at bedtime.  1 tablet  0  . traMADol (ULTRAM) 50 MG tablet Take 1  tablet (50 mg total) by mouth every 4 (four) hours as needed for pain. For pain  60 tablet  1  . warfarin (COUMADIN) 5 MG tablet TAKE 1 TABLET EVERY DAY AS DIRECTED  30 tablet  3  . [DISCONTINUED] zolpidem (AMBIEN) 10 MG tablet Take 10 mg by mouth at bedtime  as needed.        No current facility-administered medications on file prior to visit.    Allergies  Allergen Reactions  . Oxycodone-Acetaminophen Other (See Comments)    Went "out of head"  . Oxycodone-Acetaminophen Other (See Comments)    Went "out of head"  . Oxycodone-Aspirin Rash    Unknown  Unknown   . Sulfamethoxazole Rash and Itching    Family History  Problem Relation Age of Onset  . Heart disease Mother   . Heart attack Mother   . Stroke Mother   . Heart disease Father   . Heart attack Father   . Stroke Father   . Breast cancer Sister     Twin   . Heart attack Sister   . Dementia Sister   . Heart disease Sister   . Heart attack Sister   . Clotting disorder Sister   . Heart attack Sister     BP 124/80  Pulse 67  Temp(Src) 98.2 F (36.8 C) (Oral)  Ht 5\' 7"  (1.702 m)  Wt 186 lb (84.369 kg)  BMI 29.12 kg/m2  SpO2 96%   Review of Systems  Denies hearing loss, and visual loss Objective:   Vision:  Sees opthalmologist Hearing: grossly normal Body mass index:  See vs page Msk: pt easily and quickly performs "get-up-and-go" from a sitting position Cognitive Impairment Assessment: cognition, memory and judgment appear normal.  remembers 3/3 at 5 minutes.  excellent recall.  can easily read and write a sentence.  alert and oriented x 3   Assessment:   Medicare wellness utd on preventive parameters    Plan:   During the course of the visit the patient was educated and counseled about appropriate screening and preventive services including:        Fall prevention   Diabetes screening  Nutrition counseling   Vaccines / LABS Zostavax / Pneumococcal Vaccine  today  PSA  Patient Instructions (the  written plan) was given to the patient.    we discussed code status.  pt requests full code, but would not want to be started or maintained on artificial life-support measures if there was not a reasonable chance of recovery

## 2013-04-25 ENCOUNTER — Telehealth: Payer: Self-pay | Admitting: Neurology

## 2013-04-25 ENCOUNTER — Telehealth: Payer: Self-pay | Admitting: *Deleted

## 2013-04-25 LAB — TSH: TSH: 1.875 u[IU]/mL (ref 0.350–4.500)

## 2013-04-25 LAB — URINALYSIS, ROUTINE W REFLEX MICROSCOPIC
BILIRUBIN URINE: NEGATIVE
Glucose, UA: NEGATIVE mg/dL
HGB URINE DIPSTICK: NEGATIVE
Ketones, ur: NEGATIVE mg/dL
Leukocytes, UA: NEGATIVE
Nitrite: NEGATIVE
PROTEIN: NEGATIVE mg/dL
Specific Gravity, Urine: 1.017 (ref 1.005–1.030)
UROBILINOGEN UA: 0.2 mg/dL (ref 0.0–1.0)
pH: 5.5 (ref 5.0–8.0)

## 2013-04-25 LAB — MICROALBUMIN / CREATININE URINE RATIO
Creatinine, Urine: 209 mg/dL
MICROALB UR: 2.63 mg/dL — AB (ref 0.00–1.89)
Microalb Creat Ratio: 12.6 mg/g (ref 0.0–30.0)

## 2013-04-25 LAB — PSA, MEDICARE: PSA: 3.93 ng/mL (ref <=4.00)

## 2013-04-25 NOTE — Telephone Encounter (Signed)
Pt called back states he can't do 03/18 at 4, it's his birthday and his family is taking him out. Pt states should he increase his medication predniSONE (DELTASONE) 5 MG tablet and not worry about coming in. Pt states his eyes are swollen and thought making increasing med's would help. Please call pt back on cell phone to advise and see earlier note from [place].

## 2013-04-25 NOTE — Telephone Encounter (Signed)
Patient is calling back stating that he could not make that appt on 04/26/13, because that is his birthday and his family is taking him out. Patient stated could he just increase his predinisone and not come in. Patient states that his eyes are swollen and thought increasing medicine would help. Please advise

## 2013-04-25 NOTE — Telephone Encounter (Signed)
Dr. Jannifer Franklin would like to see patient in 1 week. Available 4 pm 3/18 1st appt.

## 2013-04-25 NOTE — Telephone Encounter (Signed)
I called patient. The patient had pneumonia 2 weeks ago, and he had tapered his prednisone dose down to 5 mg daily. The ptosis from the myasthenia has come back. We have worked him in for an urgent revisit tomorrow, but the patient indicates that he is unable to make the appointment because it is his birthday. We will try to reschedule at some other time. The patient will go up on the prednisone taking 10 mg daily.

## 2013-04-28 DIAGNOSIS — C9211 Chronic myeloid leukemia, BCR/ABL-positive, in remission: Secondary | ICD-10-CM | POA: Diagnosis not present

## 2013-05-01 ENCOUNTER — Ambulatory Visit (INDEPENDENT_AMBULATORY_CARE_PROVIDER_SITE_OTHER): Payer: Medicare Other | Admitting: Neurology

## 2013-05-01 ENCOUNTER — Encounter: Payer: Self-pay | Admitting: Neurology

## 2013-05-01 VITALS — BP 132/71 | HR 77 | Wt 187.0 lb

## 2013-05-01 DIAGNOSIS — G7000 Myasthenia gravis without (acute) exacerbation: Secondary | ICD-10-CM

## 2013-05-01 NOTE — Patient Instructions (Signed)
Myasthenia Gravis Myasthenia gravis is a disease that causes muscle weakness throughout the body. The muscles affected are the ones we can control (voluntary muscles). An example of a voluntary muscle is your hand muscles. You can control the muscles to make the hand pick something up. An example of an involuntary muscle is the heart. The heart beats without any direction from you.  Myasthenia Gravis is thought to be an autoimmune disease. That means that normal defenses of the body begin to attack the body. In this case, the immune system begins to attack cells located at the junctions of the muscles and the nerves. Women are affected more often. Women are affected at a younger age than men. Babies born to affected women frequently develop symptoms at an early age. SYMPTOMS Initially in the disease, the facial muscles are affected first. After this, a person may develop droopy eyelids. They may have difficulty controlling facial muscles. They may have problems chewing. Swallowing and speaking may become impaired. The weakness gradually spreads to the arms and legs. It begins to affect breathing. Sometimes, the symptoms lessen or go away without any apparent cause. DIAGNOSIS  Diagnosis can be made with blood tests. Tests such as electromyography may be done to examine the electrical activity in the muscle. An improvement in symptoms after having an anti-cholinesterase drug helps confirm the diagnosis.  TREATMENT  Medicines are usually prescribed as the first treatment. These medicines help, but they do not cure the disease. A plasma cleansing procedure (plasmapheresis) can be used to treat a crisis. It can also be used to prepare a person for surgery. This procedure produces short-term improvement. Some cases are helped by removing the thymus gland. Steroids are used for short-term benefits. Document Released: 05/04/2000 Document Revised: 04/20/2011 Document Reviewed: 01/26/2005 [employer] Patient  Information 2014 [employer].  

## 2013-05-01 NOTE — Progress Notes (Signed)
Reason for visit: Myasthenia gravis  [name] is an 73 y.o. male  History of present illness:  [name] is a 73 year old right-handed white male with a history of myasthenia gravis primarily ocular features. The patient has recently had a bout of pneumonia, but the patient is trying to reduce his prednisone dose on his own, and he got down to a 5 mg daily dose. The patient worsened dramatically with his myasthenia gravis, with increased ptosis of the eyes. The patient has gone back up to 10 mg of prednisone daily last week, and he has already had a significant benefit with the ptosis. The patient denies any overt double vision. The patient has been having a lot of trouble driving a car and watching TV and reading because of the ptosis. The patient returns to the office today for an evaluation. The patient has not noted any generalized symptoms of weakness of the extremities, or problems with chewing or swallowing. The patient denies any significant problems with shortness of breath, but he does have some slight dyspnea related to the pneumonia.  Past Medical History  Diagnosis Date  . Coronary atherosclerosis of unspecified type of vessel, native or graft     remote cath in 2003 showing distal and branch vessel CAD with normal LV function; managed medically  . Mixed hyperlipidemia   . Glucose intolerance (impaired glucose tolerance)     history of  . Diverticulosis of colon (without mention of hemorrhage)   . Hx of colonic polyps   . Internal hemorrhoids without mention of complication   . Rash and other nonspecific skin eruption   . Pneumonia, organism unspecified   . Conjunctivitis unspecified   . Myasthenia gravis     uses prednisone  . GERD (gastroesophageal reflux disease)   . Pulmonary embolism June 2013  . DVT (deep venous thrombosis) June 2013  . Chronic anticoagulation   . Diabetes mellitus   . DJD (degenerative joint disease)   . Ptosis   . CML (chronic myeloid  leukemia)   . HTN (hypertension)   . IBS (irritable bowel syndrome)   . History of pneumonia   . Dystonia   . High cholesterol   . Heart disease   . Cancer   . 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 PONV (postoperative nausea and vomiting)   . External hemorrhoids without mention of complication     blood clot left 6/13 with pe  . History of kidney stones     Past Surgical History  Procedure Laterality Date  . Angioplasty  2001  . Back surgery      x2, lumbar and cervical  . Tonsillectomy    . Cataract extraction Bilateral 12/12  . Shoulder surgery Left 05/07/2009  . Cardiac catheterization    . Lumbar laminectomy/decompression microdiscectomy Right 09/14/2012    Procedure: Right Lumbar three-four, four-five, Lumbar five-Sacral one decompressive laminectomy;  Surgeon: [name], MD;  Location: [facility];  Service: Neurosurgery;  Laterality: Right;    Family History  Problem Relation Age of Onset  . Heart disease Mother   . Heart attack Mother   . Stroke Mother   . Heart disease Father   . Heart attack Father   . Stroke Father   . Breast cancer Sister     Twin   . Heart attack Sister   . Dementia Sister   . Heart disease Sister   . Heart attack Sister   . Clotting disorder Sister   .  Heart attack Sister     Social history:  reports that he has never smoked. He has never used smokeless tobacco. He reports that he does not drink alcohol or use illicit drugs.    Allergies  Allergen Reactions  . Oxycodone-Acetaminophen Other (See Comments)    Went "out of head"  . Oxycodone-Acetaminophen Other (See Comments)    Went "out of head"  . Oxycodone-Aspirin Rash    Unknown  Unknown   . Sulfamethoxazole Rash and Itching    Medications:  Current Outpatient Prescriptions on File Prior to Visit  Medication Sig Dispense Refill  . esomeprazole (NEXIUM) 40 MG capsule Take 40 mg by mouth daily.      Marland Kitchen ibuprofen (ADVIL,MOTRIN) 800 MG tablet Take 800 mg by mouth  as needed.      . imatinib (GLEEVEC) 400 MG tablet Take 400 mg by mouth daily.       . isosorbide mononitrate (IMDUR) 30 MG 24 hr tablet Take 1 tablet (30 mg total) by mouth daily.  90 tablet  3  . lisinopril (PRINIVIL,ZESTRIL) 5 MG tablet Take 1 tablet (5 mg total) by mouth daily.  90 tablet  3  . loperamide (IMODIUM) 2 MG capsule Take 2 mg by mouth daily. For diarrhea      . LORazepam (ATIVAN) 1 MG tablet Take 1 tablet (1 mg total) by mouth at bedtime. As needed  30 tablet  1  . metFORMIN (GLUCOPHAGE-XR) 500 MG 24 hr tablet TAKE 1 TABLET BY MOUTH EVERY DAY IN THE MORNING  90 tablet  0  . metoprolol succinate (TOPROL-XL) 25 MG 24 hr tablet Take 1 tablet (25 mg total) by mouth daily.  30 tablet  6  . Multiple Vitamin (MULTIVITAMIN) tablet Take 1 tablet by mouth daily.      . Multiple Vitamins-Minerals (OCUVITE PO) Take 1 tablet by mouth daily.      . nitrofurantoin (MACRODANTIN) 100 MG capsule Take 1 capsule (100 mg total) by mouth 4 (four) times daily.  28 capsule  0  . ondansetron (ZOFRAN) 4 MG tablet Take 1 tablet (4 mg total) by mouth every 12 (twelve) hours as needed for nausea.  20 tablet  2  . predniSONE (DELTASONE) 5 MG tablet Take 10 mg by mouth daily.       . promethazine (PHENERGAN) 25 MG tablet Take 12.5 mg by mouth every 6 (six) hours as needed for nausea.      Marland Kitchen pyridostigmine (MESTINON) 60 MG tablet Take 1 tablet (60 mg total) by mouth 3 (three) times daily.  90 tablet  5  . rosuvastatin (CRESTOR) 10 MG tablet Take 0.5 tablets (5 mg total) by mouth at bedtime.  1 tablet  0  . traMADol (ULTRAM) 50 MG tablet Take 1 tablet (50 mg total) by mouth every 4 (four) hours as needed for pain. For pain  60 tablet  1  . warfarin (COUMADIN) 5 MG tablet TAKE 1 TABLET EVERY DAY AS DIRECTED  30 tablet  3  . [DISCONTINUED] zolpidem (AMBIEN) 10 MG tablet Take 10 mg by mouth at bedtime as needed.        No current facility-administered medications on file prior to visit.    ROS:  Out of a  complete 14 system review of symptoms, the patient complains only of the following symptoms, and all other reviewed systems are negative.  Ptosis of the eyes Shortness of breath Swollen lymph nodes  Blood pressure 132/71, pulse 77, weight 187 lb (84.823 kg).  Physical  Exam  General: The patient is alert and cooperative at the time of the examination.  Skin: No significant peripheral edema is noted.   Neurologic Exam  Mental status: The patient is oriented x 3.  Cranial nerves: Facial symmetry is present. Speech is normal, no aphasia or dysarthria is noted. Extraocular movements are full. Visual fields are full. With superior gaze for 1 minute, no significant increase in ptosis was noted. There is slight ptosis of the left eye compared to the right.  Motor: The patient has good strength in all 4 extremities.With the arms outstretched 1 minute, there is no fatigable weakness of the deltoid muscles.  Sensory examination: Soft touch sensation on the face, arms, and legs is symmetric.  Coordination: The patient has good finger-nose-finger and heel-to-shin bilaterally.  Gait and station: The patient has a normal gait. Tandem gait is normal. Romberg is negative. No drift is seen.  Reflexes: Deep tendon reflexes are symmetric.   Assessment/Plan:  1. Myasthenia gravis, ocular  The patient has had worsening of his myasthenia gravis associated with lowering of the prednisone dose and an intercurrent illness with pneumonia. The patient has now gone back to the 10 mg dose of prednisone, and he is doing better. The patient will continue the prednisone at 10 mg daily, and he will followup in 5 months. The patient should not alter the prednisone dosing on his own in the future.  Jill Alexanders MD 05/01/2013 7:17 PM  [facility] [address] [place], [place] [address]  Phone [phone] Fax [phone]

## 2013-05-02 ENCOUNTER — Telehealth: Payer: Self-pay

## 2013-05-02 NOTE — Telephone Encounter (Signed)
The patient went to [facility] for xrays and Alyse Low in the radiology department is hoping to follow up on these orders.   [facility] [employer]) [phone]

## 2013-05-02 NOTE — Telephone Encounter (Signed)
Order for Chest X-Ray faxed to [facility].

## 2013-05-03 DIAGNOSIS — R972 Elevated prostate specific antigen [PSA]: Secondary | ICD-10-CM | POA: Diagnosis not present

## 2013-05-03 DIAGNOSIS — R918 Other nonspecific abnormal finding of lung field: Secondary | ICD-10-CM | POA: Diagnosis not present

## 2013-05-03 DIAGNOSIS — N401 Enlarged prostate with lower urinary tract symptoms: Secondary | ICD-10-CM | POA: Diagnosis not present

## 2013-05-03 DIAGNOSIS — J9819 Other pulmonary collapse: Secondary | ICD-10-CM | POA: Diagnosis not present

## 2013-05-03 DIAGNOSIS — N139 Obstructive and reflux uropathy, unspecified: Secondary | ICD-10-CM | POA: Diagnosis not present

## 2013-05-03 DIAGNOSIS — J189 Pneumonia, unspecified organism: Secondary | ICD-10-CM | POA: Diagnosis not present

## 2013-05-04 ENCOUNTER — Telehealth: Payer: Self-pay | Admitting: Endocrinology

## 2013-05-04 NOTE — Telephone Encounter (Signed)
Pt informed and states he will call back and make an appointment.

## 2013-05-04 NOTE — Telephone Encounter (Signed)
Good.  Please come back for a follow-up appointment in 1 month.

## 2013-05-04 NOTE — Telephone Encounter (Signed)
Pt called back and stated that he feels better and can tell he is regaining some of his strength.  Please advise, Thanks!

## 2013-05-04 NOTE — Telephone Encounter (Signed)
please call patient: CXR is no better.   Are you feeling better?  If you are, we can recheck this x-ray next week.   If you are not feeling better, please let me know.

## 2013-05-04 NOTE — Telephone Encounter (Signed)
Requested call back to discuss.  

## 2013-05-11 ENCOUNTER — Telehealth: Payer: Self-pay | Admitting: Endocrinology

## 2013-05-11 ENCOUNTER — Ambulatory Visit
Admission: RE | Admit: 2013-05-11 | Discharge: 2013-05-11 | Disposition: A | Discharge status: Home / Self Care | Payer: Medicare Other | Source: Ambulatory Visit | Attending: Endocrinology | Admitting: Endocrinology

## 2013-05-11 ENCOUNTER — Telehealth: Payer: Self-pay

## 2013-05-11 ENCOUNTER — Ambulatory Visit (INDEPENDENT_AMBULATORY_CARE_PROVIDER_SITE_OTHER): Payer: Medicare Other | Admitting: Endocrinology

## 2013-05-11 ENCOUNTER — Encounter: Payer: Self-pay | Admitting: Endocrinology

## 2013-05-11 ENCOUNTER — Other Ambulatory Visit: Payer: Medicare Other

## 2013-05-11 VITALS — BP 128/68 | HR 72 | Temp 98.4°F | Resp 16 | Ht 67.0 in | Wt 184.0 lb

## 2013-05-11 DIAGNOSIS — J189 Pneumonia, unspecified organism: Secondary | ICD-10-CM

## 2013-05-11 DIAGNOSIS — I10 Essential (primary) hypertension: Secondary | ICD-10-CM

## 2013-05-11 DIAGNOSIS — R0789 Other chest pain: Secondary | ICD-10-CM

## 2013-05-11 DIAGNOSIS — Z7901 Long term (current) use of anticoagulants: Secondary | ICD-10-CM | POA: Diagnosis not present

## 2013-05-11 DIAGNOSIS — R071 Chest pain on breathing: Secondary | ICD-10-CM

## 2013-05-11 DIAGNOSIS — I2699 Other pulmonary embolism without acute cor pulmonale: Secondary | ICD-10-CM | POA: Diagnosis not present

## 2013-05-11 DIAGNOSIS — R05 Cough: Secondary | ICD-10-CM | POA: Diagnosis not present

## 2013-05-11 DIAGNOSIS — R059 Cough, unspecified: Secondary | ICD-10-CM | POA: Diagnosis not present

## 2013-05-11 LAB — CBC WITH DIFFERENTIAL/PLATELET
BASOS PCT: 0 % (ref 0–1)
Basophils Absolute: 0 10*3/uL (ref 0.0–0.1)
EOS ABS: 0.1 10*3/uL (ref 0.0–0.7)
Eosinophils Relative: 1 % (ref 0–5)
HCT: 37.7 % — ABNORMAL LOW (ref 39.0–52.0)
HEMOGLOBIN: 12.4 g/dL — AB (ref 13.0–17.0)
LYMPHS ABS: 1.1 10*3/uL (ref 0.7–4.0)
Lymphocytes Relative: 11 % — ABNORMAL LOW (ref 12–46)
MCH: 31.4 pg (ref 26.0–34.0)
MCHC: 32.9 g/dL (ref 30.0–36.0)
MCV: 95.4 fL (ref 78.0–100.0)
MONOS PCT: 5 % (ref 3–12)
Monocytes Absolute: 0.5 10*3/uL (ref 0.1–1.0)
NEUTROS ABS: 8.2 10*3/uL — AB (ref 1.7–7.7)
Neutrophils Relative %: 83 % — ABNORMAL HIGH (ref 43–77)
PLATELETS: 205 10*3/uL (ref 150–400)
RBC: 3.95 MIL/uL — AB (ref 4.22–5.81)
RDW: 14 % (ref 11.5–15.5)
WBC: 9.9 10*3/uL (ref 4.0–10.5)

## 2013-05-11 LAB — BASIC METABOLIC PANEL
BUN: 14 mg/dL (ref 6–23)
CALCIUM: 9.1 mg/dL (ref 8.4–10.5)
CO2: 25 mEq/L (ref 19–32)
CREATININE: 1.33 mg/dL (ref 0.50–1.35)
Chloride: 103 mEq/L (ref 96–112)
Glucose, Bld: 98 mg/dL (ref 70–99)
Potassium: 4.3 mEq/L (ref 3.5–5.3)
Sodium: 139 mEq/L (ref 135–145)

## 2013-05-11 MED ORDER — AZITHROMYCIN 500 MG PO TABS: 500.0000 mg | ORAL_TABLET | Freq: Every day | ORAL | Status: DC

## 2013-05-11 NOTE — Telephone Encounter (Signed)
Called [name]. Pt was scheduled for today, but pt had already got back to his house is [place] and would not have made it back before closing. Pt is scheduled for Monday at 10:30 am. Pt is aware.

## 2013-05-11 NOTE — Telephone Encounter (Signed)
please call [facility] i tried to call [facility], but was on hold for a while Ct scan needs to be done today, if at all possible.

## 2013-05-11 NOTE — Telephone Encounter (Signed)
Pt called requesting to be seen today. Pt states he has been having chest pain and chest pressure. Pt was strongly advised to go to ER, but pt refused. Pt states if he was not seen today he would not go to another office or to the ER. Per Dr pt scheduled to come in for ov on 05/11/2013.

## 2013-05-11 NOTE — Progress Notes (Signed)
Subjective:    Patient ID: [name], male    DOB: [birth year], 73 y.o.   MRN: [id number]  HPI Pt reports 1 month of prod-quality cough, and assoc pain at the upper anterior chest.  He feels as though he never recovered from the pneumonia.  He was rx'ed in [place] with ceftin.  Pt says dr Jannifer Franklin told him cipro is ok, as he has only ocular MG Past Medical History  Diagnosis Date  . Coronary atherosclerosis of unspecified type of vessel, native or graft     remote cath in 2003 showing distal and branch vessel CAD with normal LV function; managed medically  . Mixed hyperlipidemia   . Glucose intolerance (impaired glucose tolerance)     history of  . Diverticulosis of colon (without mention of hemorrhage)   . Hx of colonic polyps   . Internal hemorrhoids without mention of complication   . Rash and other nonspecific skin eruption   . Pneumonia, organism unspecified   . Conjunctivitis unspecified   . Myasthenia gravis     uses prednisone  . GERD (gastroesophageal reflux disease)   . Pulmonary embolism June 2013  . DVT (deep venous thrombosis) June 2013  . Chronic anticoagulation   . Diabetes mellitus   . DJD (degenerative joint disease)   . Ptosis   . CML (chronic myeloid leukemia)   . HTN (hypertension)   . IBS (irritable bowel syndrome)   . History of pneumonia   . Dystonia   . High cholesterol   . Heart disease   . Cancer   . 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 PONV (postoperative nausea and vomiting)   . External hemorrhoids without mention of complication     blood clot left 6/13 with pe  . History of kidney stones     Past Surgical History  Procedure Laterality Date  . Angioplasty  2001  . Back surgery      x2, lumbar and cervical  . Tonsillectomy    . Cataract extraction Bilateral 12/12  . Shoulder surgery Left 05/07/2009  . Cardiac catheterization    . Lumbar laminectomy/decompression microdiscectomy Right 09/14/2012    Procedure: Right Lumbar  three-four, four-five, Lumbar five-Sacral one decompressive laminectomy;  Surgeon: [name], MD;  Location: [facility];  Service: Neurosurgery;  Laterality: Right;    History   Social History  . Marital Status: Married    Spouse Name: N/A    Number of Children: 2  . Years of Education: 1-College   Occupational History  . retired     Retired   Social History Main Topics  . Smoking status: Never Smoker   . Smokeless tobacco: Never Used  . Alcohol Use: No  . Drug Use: No  . Sexual Activity: Not Currently   Other Topics Concern  . Not on file   Social History Narrative  . No narrative on file    Current Outpatient Prescriptions on File Prior to Visit  Medication Sig Dispense Refill  . esomeprazole (NEXIUM) 40 MG capsule Take 40 mg by mouth daily.      Marland Kitchen ibuprofen (ADVIL,MOTRIN) 800 MG tablet Take 800 mg by mouth as needed.      . imatinib (GLEEVEC) 400 MG tablet Take 400 mg by mouth daily.       . isosorbide mononitrate (IMDUR) 30 MG 24 hr tablet Take 1 tablet (30 mg total) by mouth daily.  90 tablet  3  . lisinopril (PRINIVIL,ZESTRIL) 5 MG  tablet Take 1 tablet (5 mg total) by mouth daily.  90 tablet  3  . loperamide (IMODIUM) 2 MG capsule Take 2 mg by mouth daily. For diarrhea      . LORazepam (ATIVAN) 1 MG tablet Take 1 tablet (1 mg total) by mouth at bedtime. As needed  30 tablet  1  . metFORMIN (GLUCOPHAGE-XR) 500 MG 24 hr tablet TAKE 1 TABLET BY MOUTH EVERY DAY IN THE MORNING  90 tablet  0  . metoprolol succinate (TOPROL-XL) 25 MG 24 hr tablet Take 1 tablet (25 mg total) by mouth daily.  30 tablet  6  . Multiple Vitamin (MULTIVITAMIN) tablet Take 1 tablet by mouth daily.      . Multiple Vitamins-Minerals (OCUVITE PO) Take 1 tablet by mouth daily.      . ondansetron (ZOFRAN) 4 MG tablet Take 1 tablet (4 mg total) by mouth every 12 (twelve) hours as needed for nausea.  20 tablet  2  . predniSONE (DELTASONE) 5 MG tablet Take 10 mg by mouth daily.       . promethazine  (PHENERGAN) 25 MG tablet Take 12.5 mg by mouth every 6 (six) hours as needed for nausea.      Marland Kitchen pyridostigmine (MESTINON) 60 MG tablet Take 1 tablet (60 mg total) by mouth 3 (three) times daily.  90 tablet  5  . rosuvastatin (CRESTOR) 10 MG tablet Take 0.5 tablets (5 mg total) by mouth at bedtime.  1 tablet  0  . traMADol (ULTRAM) 50 MG tablet Take 1 tablet (50 mg total) by mouth every 4 (four) hours as needed for pain. For pain  60 tablet  1  . warfarin (COUMADIN) 5 MG tablet TAKE 1 TABLET EVERY DAY AS DIRECTED  30 tablet  3  . [DISCONTINUED] zolpidem (AMBIEN) 10 MG tablet Take 10 mg by mouth at bedtime as needed.        No current facility-administered medications on file prior to visit.    Allergies  Allergen Reactions  . Oxycodone-Acetaminophen Other (See Comments)    Went "out of head"  . Oxycodone-Acetaminophen Other (See Comments)    Went "out of head"  . Oxycodone-Aspirin Rash    Unknown  Unknown   . Sulfamethoxazole Rash and Itching    Family History  Problem Relation Age of Onset  . Heart disease Mother   . Heart attack Mother   . Stroke Mother   . Heart disease Father   . Heart attack Father   . Stroke Father   . Breast cancer Sister     Twin   . Heart attack Sister   . Dementia Sister   . Heart disease Sister   . Heart attack Sister   . Clotting disorder Sister   . Heart attack Sister     BP 128/68  Pulse 72  Temp(Src) 98.4 F (36.9 C)  Resp 16  Ht 5\' 7"  (1.702 m)  Wt 184 lb (83.462 kg)  BMI 28.81 kg/m2  SpO2 97%  Review of Systems Denies fever, but he has flushing.      Objective:   Physical Exam VITAL SIGNS:  See vs page GENERAL: no distress LUNGS:  Clear to auscultation HEART:  Regular rate and rhythm without murmurs noted. Normal S1,S2.   Chest wall: nontender.   CXR: NAD i reviewed electrocardiogram    Assessment & Plan:  Pneumonia: better Chest wall pain, new, uncertain etiology. Anticoagulation: i discussed with coumadin clinic  pharmacist: we agree to use zithromax, but  he does not need to return sooner for coumadin clinic.

## 2013-05-11 NOTE — Patient Instructions (Addendum)
blood tests, and a chest-x-ray, are being requested for you today.  We'll contact you with results.   i have sent a prescription to your pharmacy, for an antibiotic pill. I hope you feel better soon.  If you don't feel better by next week, please call back.  Please call sooner if you get worse.

## 2013-05-15 ENCOUNTER — Other Ambulatory Visit: Payer: Self-pay | Admitting: Endocrinology

## 2013-05-15 ENCOUNTER — Ambulatory Visit (INDEPENDENT_AMBULATORY_CARE_PROVIDER_SITE_OTHER)
Admission: RE | Admit: 2013-05-15 | Discharge: 2013-05-15 | Disposition: A | Discharge status: Home / Self Care | Payer: Medicare Other | Source: Ambulatory Visit | Attending: Endocrinology | Admitting: Endocrinology

## 2013-05-15 DIAGNOSIS — R071 Chest pain on breathing: Secondary | ICD-10-CM | POA: Diagnosis not present

## 2013-05-15 DIAGNOSIS — R0789 Other chest pain: Secondary | ICD-10-CM

## 2013-05-15 DIAGNOSIS — J984 Other disorders of lung: Secondary | ICD-10-CM | POA: Diagnosis not present

## 2013-05-15 DIAGNOSIS — I2510 Atherosclerotic heart disease of native coronary artery without angina pectoris: Secondary | ICD-10-CM

## 2013-05-15 MED ORDER — IOHEXOL 350 MG/ML SOLN
100.0000 mL | Freq: Once | INTRAVENOUS | Status: AC | PRN
  Administered 2013-05-15: 80 mL via INTRAVENOUS

## 2013-05-17 ENCOUNTER — Telehealth: Payer: Self-pay | Admitting: Oncology

## 2013-05-17 LAB — CULTURE, BLOOD (SINGLE): ORGANISM ID, BACTERIA: NO GROWTH

## 2013-05-17 NOTE — Telephone Encounter (Signed)
pt called and r/s appt from June to August 2015 lab and MD

## 2013-05-19 ENCOUNTER — Other Ambulatory Visit: Payer: Medicare Other

## 2013-05-24 ENCOUNTER — Ambulatory Visit (INDEPENDENT_AMBULATORY_CARE_PROVIDER_SITE_OTHER): Payer: Medicare Other | Admitting: Physician Assistant

## 2013-05-24 ENCOUNTER — Ambulatory Visit (INDEPENDENT_AMBULATORY_CARE_PROVIDER_SITE_OTHER): Payer: Medicare Other | Admitting: Pharmacist

## 2013-05-24 ENCOUNTER — Encounter: Payer: Self-pay | Admitting: Physician Assistant

## 2013-05-24 ENCOUNTER — Ambulatory Visit (INDEPENDENT_AMBULATORY_CARE_PROVIDER_SITE_OTHER): Payer: Medicare Other | Admitting: Endocrinology

## 2013-05-24 ENCOUNTER — Ambulatory Visit: Payer: Medicare Other | Admitting: Cardiovascular Disease

## 2013-05-24 ENCOUNTER — Encounter: Payer: Self-pay | Admitting: Endocrinology

## 2013-05-24 VITALS — BP 130/84 | HR 70 | Temp 98.1°F | Ht 67.0 in | Wt 187.0 lb

## 2013-05-24 VITALS — BP 131/65 | HR 61 | Ht 67.0 in | Wt 181.8 lb

## 2013-05-24 DIAGNOSIS — I2699 Other pulmonary embolism without acute cor pulmonale: Secondary | ICD-10-CM

## 2013-05-24 DIAGNOSIS — R9389 Abnormal findings on diagnostic imaging of other specified body structures: Secondary | ICD-10-CM | POA: Diagnosis not present

## 2013-05-24 DIAGNOSIS — I2510 Atherosclerotic heart disease of native coronary artery without angina pectoris: Secondary | ICD-10-CM | POA: Diagnosis not present

## 2013-05-24 DIAGNOSIS — R918 Other nonspecific abnormal finding of lung field: Secondary | ICD-10-CM

## 2013-05-24 DIAGNOSIS — Z5181 Encounter for therapeutic drug level monitoring: Secondary | ICD-10-CM

## 2013-05-24 DIAGNOSIS — R0989 Other specified symptoms and signs involving the circulatory and respiratory systems: Secondary | ICD-10-CM

## 2013-05-24 DIAGNOSIS — R079 Chest pain, unspecified: Secondary | ICD-10-CM | POA: Diagnosis not present

## 2013-05-24 DIAGNOSIS — I82409 Acute embolism and thrombosis of unspecified deep veins of unspecified lower extremity: Secondary | ICD-10-CM

## 2013-05-24 DIAGNOSIS — I10 Essential (primary) hypertension: Secondary | ICD-10-CM

## 2013-05-24 DIAGNOSIS — R931 Abnormal findings on diagnostic imaging of heart and coronary circulation: Secondary | ICD-10-CM

## 2013-05-24 LAB — BASIC METABOLIC PANEL
BUN: 13 mg/dL (ref 6–23)
CALCIUM: 9 mg/dL (ref 8.4–10.5)
CHLORIDE: 104 meq/L (ref 96–112)
CO2: 29 meq/L (ref 19–32)
Creatinine, Ser: 1.5 mg/dL (ref 0.4–1.5)
GFR: 50.02 mL/min — ABNORMAL LOW (ref >60.00)
Glucose, Bld: 110 mg/dL — ABNORMAL HIGH (ref 70–99)
Potassium: 3.6 mEq/L (ref 3.5–5.1)
SODIUM: 139 meq/L (ref 135–145)

## 2013-05-24 LAB — POCT INR: INR: 2.4

## 2013-05-24 NOTE — Assessment & Plan Note (Signed)
Patient has bilateral carotid bruits. We'll order carotid Dopplers.

## 2013-05-24 NOTE — Progress Notes (Signed)
Subjective:    Patient ID: [name], male    DOB: Nov 21, 1940, 73 y.o.   MRN: [id number]  HPI Pt says he has not felt well x 2 months.  He still has slight dry-quality cough in the chest, but no assoc wheezing.  He is unaware of any chronic pulm dz.   Past Medical History  Diagnosis Date  . Coronary atherosclerosis of unspecified type of vessel, native or graft     remote cath in 2003 showing distal and branch vessel CAD with normal LV function; managed medically  . Mixed hyperlipidemia   . Glucose intolerance (impaired glucose tolerance)     history of  . Diverticulosis of colon (without mention of hemorrhage)   . Hx of colonic polyps   . Internal hemorrhoids without mention of complication   . Rash and other nonspecific skin eruption   . Pneumonia, organism unspecified   . Conjunctivitis unspecified   . Myasthenia gravis     uses prednisone  . GERD (gastroesophageal reflux disease)   . Pulmonary embolism June 2013  . DVT (deep venous thrombosis) June 2013  . Chronic anticoagulation   . Diabetes mellitus   . DJD (degenerative joint disease)   . Ptosis   . CML (chronic myeloid leukemia)   . HTN (hypertension)   . IBS (irritable bowel syndrome)   . History of pneumonia   . Dystonia   . High cholesterol   . Heart disease   . Cancer   . 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 PONV (postoperative nausea and vomiting)   . External hemorrhoids without mention of complication     blood clot left 6/13 with pe  . History of kidney stones     Past Surgical History  Procedure Laterality Date  . Angioplasty  2001  . Back surgery      x2, lumbar and cervical  . Tonsillectomy    . Cataract extraction Bilateral 12/12  . Shoulder surgery Left 05/07/2009  . Cardiac catheterization    . Lumbar laminectomy/decompression microdiscectomy Right 09/14/2012    Procedure: Right Lumbar three-four, four-five, Lumbar five-Sacral one decompressive laminectomy;  Surgeon: [name], MD;  Location: [facility];  Service: Neurosurgery;  Laterality: Right;    History   Social History  . Marital Status: Married    Spouse Name: N/A    Number of Children: 2  . Years of Education: 1-College   Occupational History  . retired     Retired   Social History Main Topics  . Smoking status: Never Smoker   . Smokeless tobacco: Never Used  . Alcohol Use: No  . Drug Use: No  . Sexual Activity: Not Currently   Other Topics Concern  . Not on file   Social History Narrative  . No narrative on file    Current Outpatient Prescriptions on File Prior to Visit  Medication Sig Dispense Refill  . esomeprazole (NEXIUM) 40 MG capsule Take 40 mg by mouth daily.      Marland Kitchen ibuprofen (ADVIL,MOTRIN) 800 MG tablet Take 800 mg by mouth as needed.      . imatinib (GLEEVEC) 400 MG tablet Take 400 mg by mouth daily.       . isosorbide mononitrate (IMDUR) 30 MG 24 hr tablet Take 1 tablet (30 mg total) by mouth daily.  90 tablet  3  . lisinopril (PRINIVIL,ZESTRIL) 5 MG tablet Take 1 tablet (5 mg total) by mouth daily.  90 tablet  3  .  loperamide (IMODIUM) 2 MG capsule Take 2 mg by mouth daily. For diarrhea      . LORazepam (ATIVAN) 1 MG tablet Take 1 tablet (1 mg total) by mouth at bedtime. As needed  30 tablet  1  . metFORMIN (GLUCOPHAGE-XR) 500 MG 24 hr tablet TAKE 1 TABLET BY MOUTH EVERY DAY IN THE MORNING  90 tablet  0  . metoprolol succinate (TOPROL-XL) 25 MG 24 hr tablet Take 1 tablet (25 mg total) by mouth daily.  30 tablet  6  . Multiple Vitamin (MULTIVITAMIN) tablet Take 1 tablet by mouth daily.      . Multiple Vitamins-Minerals (OCUVITE PO) Take 1 tablet by mouth daily.      . ondansetron (ZOFRAN) 4 MG tablet Take 1 tablet (4 mg total) by mouth every 12 (twelve) hours as needed for nausea.  20 tablet  2  . predniSONE (DELTASONE) 5 MG tablet Take 10 mg by mouth daily.       . promethazine (PHENERGAN) 25 MG tablet Take 12.5 mg by mouth every 6 (six) hours as needed for nausea.       Marland Kitchen pyridostigmine (MESTINON) 60 MG tablet Take 1 tablet (60 mg total) by mouth 3 (three) times daily.  90 tablet  5  . rosuvastatin (CRESTOR) 10 MG tablet Take 0.5 tablets (5 mg total) by mouth at bedtime.  1 tablet  0  . traMADol (ULTRAM) 50 MG tablet Take 1 tablet (50 mg total) by mouth every 4 (four) hours as needed for pain. For pain  60 tablet  1  . warfarin (COUMADIN) 5 MG tablet TAKE 1 TABLET EVERY DAY AS DIRECTED  30 tablet  3  . [DISCONTINUED] zolpidem (AMBIEN) 10 MG tablet Take 10 mg by mouth at bedtime as needed.        No current facility-administered medications on file prior to visit.    Allergies  Allergen Reactions  . Oxycodone-Acetaminophen Other (See Comments)    Went "out of head"  . Oxycodone-Acetaminophen Other (See Comments)    To Strong  . Oxycodone-Aspirin Rash    Unknown  Unknown   . Sulfamethoxazole Rash and Itching    Family History  Problem Relation Age of Onset  . Heart disease Mother   . Heart attack Mother   . Stroke Mother   . Heart disease Father   . Heart attack Father   . Stroke Father   . Breast cancer Sister     Twin   . Heart attack Sister   . Dementia Sister   . Heart disease Sister   . Heart attack Sister   . Clotting disorder Sister   . Heart attack Sister     BP 130/84  Pulse 70  Temp(Src) 98.1 F (36.7 C) (Oral)  Ht 5\' 7"  (1.702 m)  Wt 187 lb (84.823 kg)  BMI 29.28 kg/m2  SpO2 95%  Review of Systems Denies fever.  Slight pain with the cough is unchanged.      Objective:   Physical Exam VITAL SIGNS:  See vs page GENERAL: no distress LUNGS:  Clear to auscultation    (i reviewed cxr result, and 2012 CT result).    Assessment & Plan:  Abnormal cxr: ? chronic pulm fibrosis. Chest pain: due to cough

## 2013-05-24 NOTE — Assessment & Plan Note (Signed)
Patient had recent atypical chest pain but a CT of his chest showed diffuse atherosclerosis with significant involvement in the coronary arteries. Last cath in 2003 as dictated above. Last stress Myoview was normal in 2013. I discussed this patient in detail with Dr. Burt Knack who recommends proceeding with stress Myoview to rule out ischemia.

## 2013-05-24 NOTE — Patient Instructions (Addendum)
Your physician has requested that you have en exercise stress myoview. For further information please visit [email or web address]. Please follow instruction sheet, as given.  Your physician has requested that you have a carotid doppler. This test is an ultrasound of the carotid arteries in your neck. It looks at blood flow through these arteries that supply the brain with blood. Allow one hour for this exam. There are no restrictions or special instructions.  Your physician recommends that you schedule a follow-up appointment in: [place] IN 1 MONTH  Your physician recommends that you return for lab work in: [place] ([facility])  Your physician recommends that you continue on your current medications as directed. Please refer to the Current Medication list given to you today.

## 2013-05-24 NOTE — Assessment & Plan Note (Signed)
Stable

## 2013-05-24 NOTE — Assessment & Plan Note (Signed)
On Coumadin 

## 2013-05-24 NOTE — Patient Instructions (Signed)
Refer to a lung specialist.  you will receive a phone call, about a day and time for an appointment.

## 2013-05-24 NOTE — Progress Notes (Signed)
HPI:  This is a 73 year old male patient Dr. Burt Knack who has history of coronary artery disease status post cath in 2003. Had a 30% distal LAD followed by an area of 50-60% towards the apex, 30% distal RCA and 95% small PDA branch. EF 60-65%. Stress Myoview in 2013 was normal without ischemia. He had DVT and pulmonary embolus in 2013 and has been on Coumadin since. The patient also has CML and history of myasthenia gravis.  He recently had pneumonia and atypical chest pain. He complains of a pressure in his upper chest that occurs at rest and lasts between one and 5 minutes. The pain does not radiate and he has no associated shortness of breath, diaphoresis, dizziness or presyncope. The pain does not occur with exertion. He Moses grass and does yard work without difficulty. Recent CT of his chest on 05/15/13. showed no evidence of recurrent pulmonary embolus but diffuse atherosclerosis with significant involvement in the coronary arteries. He also had stable mild chronic lung disease.     Allergies -- Oxycodone-Acetaminophen -- Other (See Comments)   --  Went "out of head"  -- Oxycodone-Acetaminophen -- Other (See Comments)   --  To Strong  -- Oxycodone-Aspirin -- Rash   --  Unknown            Unknown  -- Sulfamethoxazole -- Rash and Itching  Current Outpatient Prescriptions on File Prior to Visit: esomeprazole (NEXIUM) 40 MG capsule, Take 40 mg by mouth daily., Disp: , Rfl:  ibuprofen (ADVIL,MOTRIN) 800 MG tablet, Take 800 mg by mouth as needed., Disp: , Rfl:  imatinib (GLEEVEC) 400 MG tablet, Take 400 mg by mouth daily. , Disp: , Rfl:  isosorbide mononitrate (IMDUR) 30 MG 24 hr tablet, Take 1 tablet (30 mg total) by mouth daily., Disp: 90 tablet, Rfl: 3 lisinopril (PRINIVIL,ZESTRIL) 5 MG tablet, Take 1 tablet (5 mg total) by mouth daily., Disp: 90 tablet, Rfl: 3 loperamide (IMODIUM) 2 MG capsule, Take 2 mg by mouth daily. For diarrhea, Disp: , Rfl:  LORazepam (ATIVAN) 1 MG tablet, Take 1 tablet  (1 mg total) by mouth at bedtime. As needed, Disp: 30 tablet, Rfl: 1 metFORMIN (GLUCOPHAGE-XR) 500 MG 24 hr tablet, TAKE 1 TABLET BY MOUTH EVERY DAY IN THE MORNING, Disp: 90 tablet, Rfl: 0 metoprolol succinate (TOPROL-XL) 25 MG 24 hr tablet, Take 1 tablet (25 mg total) by mouth daily., Disp: 30 tablet, Rfl: 6 Multiple Vitamin (MULTIVITAMIN) tablet, Take 1 tablet by mouth daily., Disp: , Rfl:  Multiple Vitamins-Minerals (OCUVITE PO), Take 1 tablet by mouth daily., Disp: , Rfl:  ondansetron (ZOFRAN) 4 MG tablet, Take 1 tablet (4 mg total) by mouth every 12 (twelve) hours as needed for nausea., Disp: 20 tablet, Rfl: 2 predniSONE (DELTASONE) 5 MG tablet, Take 10 mg by mouth daily. , Disp: , Rfl:  promethazine (PHENERGAN) 25 MG tablet, Take 12.5 mg by mouth every 6 (six) hours as needed for nausea., Disp: , Rfl:  pyridostigmine (MESTINON) 60 MG tablet, Take 1 tablet (60 mg total) by mouth 3 (three) times daily., Disp: 90 tablet, Rfl: 5 rosuvastatin (CRESTOR) 10 MG tablet, Take 0.5 tablets (5 mg total) by mouth at bedtime., Disp: 1 tablet, Rfl: 0 traMADol (ULTRAM) 50 MG tablet, Take 1 tablet (50 mg total) by mouth every 4 (four) hours as needed for pain. For pain, Disp: 60 tablet, Rfl: 1 warfarin (COUMADIN) 5 MG tablet, TAKE 1 TABLET EVERY DAY AS DIRECTED, Disp: 30 tablet, Rfl: 3 [DISCONTINUED] zolpidem (AMBIEN) 10 MG tablet, Take  10 mg by mouth at bedtime as needed. , Disp: , Rfl:   No current facility-administered medications on file prior to visit.   Past Medical History:   Coronary atherosclerosis of unspecified type o*                Comment:remote cath in 2003 showing distal and branch               vessel CAD with normal LV function; managed               medically   Mixed hyperlipidemia                                         Glucose intolerance (impaired glucose toleranc*                Comment:history of   Diverticulosis of colon (without mention of he*              Hx of colonic polyps                                          Internal hemorrhoids without mention of compli*              Rash and other nonspecific skin eruption                     Pneumonia, organism unspecified                              Conjunctivitis unspecified                                   Myasthenia gravis                                              Comment:uses prednisone   GERD (gastroesophageal reflux disease)                       Pulmonary embolism                              June 2013    DVT (deep venous thrombosis)                    June 2013    Chronic anticoagulation                                      Diabetes mellitus                                            DJD (degenerative joint disease)                             Ptosis  CML (chronic myeloid leukemia)                               HTN (hypertension)                                           IBS (irritable bowel syndrome)                               History of pneumonia                                         Dystonia                                                     High cholesterol                                             Heart disease                                                Cancer                                                       Spinal stenosis of lumbar region                06/01/2012    PONV (postoperative nausea and vomiting)                     External hemorrhoids without mention of compli*                Comment:blood clot left 6/13 with pe   History of kidney stones                                    Past Surgical History:   ANGIOPLASTY                                      2001         BACK SURGERY                                                    Comment:x2, lumbar and cervical   TONSILLECTOMY  CATARACT EXTRACTION                             Bilateral 12/12        SHOULDER SURGERY                                 Left 05/07/2009   CARDIAC CATHETERIZATION                                       LUMBAR LAMINECTOMY/DECOMPRESSION MICRODISCECTO* Right 09/14/2012       Comment:Procedure: Right Lumbar three-four, four-five,               Lumbar five-Sacral one decompressive               laminectomy;  Surgeon: [name], MD;                Location: [facility];  Service: Neurosurgery;              Laterality: Right;  Review of patient's family history indicates:   Heart disease                  Mother                   Heart attack                   Mother                   Stroke                         Mother                   Heart disease                  Father                   Heart attack                   Father                   Stroke                         Father                   Breast cancer                  Sister                     Comment: Twin    Heart attack                   Sister                   Dementia                       Sister                   Heart disease  Sister                   Heart attack                   Sister                   Clotting disorder              Sister                   Heart attack                   Sister                   Social History   Marital Status: Married             Spouse Name:                      Years of Education: 1-College       Number of children: 2           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 Neck:Bilateral carotid bruits,  No JVD, HJR, or thyroid enlargement  Lungs: Decreased breath sounds but No tachypnea, clear without wheezing, rales, or rhonchi  Cardiovascular: RRR, PMI not displaced, heart sounds normal, no murmurs, gallops, bruit, thrill, or heave.  Abdomen: BS normal. Soft without organomegaly, masses, lesions or tenderness.  Extremities: without cyanosis, clubbing or edema. Good distal pulses bilateral  SKin: Warm, no lesions or rashes   Musculoskeletal: No deformities  Neuro: no focal signs  BP 131/65  Pulse 61  Ht 5\' 7"  (1.702 m)  Wt 181 lb 12.8 oz (82.464 kg)  BMI 28.47 kg/m2   EKG: Normal sinus rhythm, no acute change  CT of chest with contrast IMPRESSION: 1. No acute chest findings demonstrated. There is no evidence of recurrent pulmonary embolism on non dedicated imaging. 2. Diffuse atherosclerosis with significant involvement of the coronary arteries. This could potentially relate to the given symptoms. 3. No chest wall abnormality identified. 4. Stable mild chronic lung disease. Electronically Signed   By: Camie Patience M.D.   On: 05/15/2013 11:01   Overall Impression:  Normal stress nuclear study.  LV Ejection Fraction: 70%.  LV Wall Motion:  NL LV Function; NL Wall Motion  Loralie Champagne 08/26/2011   ANGIOGRAPHIC FINDINGS: 1. The left main coronary artery has approximately 10-20% stenosis, but    no significant flow-limiting lesions. 2. The left anterior descending is a medium caliber vessel that provides    three diagonal branches. There is scattered 20% stenosis in the proximal    to mid vessel with 30% more distal stenosis followed by an area of    50-60% stenosis towards the apex. Flow is TIMI-3 throughout this vessel. 3. The circumflex coronary artery essentially provides one large obtuse    marginal branch then has a 20% stenosis. 4. The right coronary artery is a dominant vessel that  essentially has two    branches in the posterior descending distribution. There was a 30%    distal RCA stenosis and a focal 95% stenosis in the smaller of the two    branches in the posterior descending territory.   LEFT VENTRICULOGRAM: The left ventriculography reveals an ejection fraction estimated at 60-65% with no focal wall motion abnormalities and no mitral regurgitation.   DIAGNOSES: 1. Distal and branch vessel coronary artery disease as outlined. 2. Normal left ventricular contraction estimated at 60-65% with no    significant mitral regurgitation.   RECOMMENDATIONS: Would continue to titrate medical therapy, specifically anti-ischemic therapy. Will add Imdur 30 mg p.o. q.h.s. The small branch in the PDA territory is not particularly amenable to percutaneous coronary intervention. The distal LAD lesion could potentially become a problem. If patients symptoms are not controlled with medical therapy, would suggest a repeat myocardial perfusion study to help define any potential area of ischemia and help guide revascularization options. Dictated by:   Satira Sark, M.D. [facility] Attending Physician:  Lacretia Leigh DD:  03/17/01 TD:  03/18/01

## 2013-05-26 ENCOUNTER — Ambulatory Visit (HOSPITAL_COMMUNITY): Payer: Medicare Other | Attending: Cardiology | Admitting: Cardiology

## 2013-05-26 DIAGNOSIS — R0989 Other specified symptoms and signs involving the circulatory and respiratory systems: Secondary | ICD-10-CM | POA: Insufficient documentation

## 2013-05-26 DIAGNOSIS — I6529 Occlusion and stenosis of unspecified carotid artery: Secondary | ICD-10-CM | POA: Diagnosis not present

## 2013-05-26 NOTE — Progress Notes (Signed)
Carotid duplex completed 

## 2013-05-30 ENCOUNTER — Encounter: Payer: Self-pay | Admitting: Endocrinology

## 2013-05-31 ENCOUNTER — Ambulatory Visit: Payer: Medicare Other | Admitting: Cardiovascular Disease

## 2013-06-01 ENCOUNTER — Encounter: Payer: Self-pay | Admitting: Physician Assistant

## 2013-06-01 ENCOUNTER — Telehealth: Payer: Self-pay | Admitting: Gastroenterology

## 2013-06-01 ENCOUNTER — Ambulatory Visit (INDEPENDENT_AMBULATORY_CARE_PROVIDER_SITE_OTHER): Payer: Medicare Other | Admitting: Physician Assistant

## 2013-06-01 ENCOUNTER — Other Ambulatory Visit (INDEPENDENT_AMBULATORY_CARE_PROVIDER_SITE_OTHER): Payer: Medicare Other

## 2013-06-01 VITALS — BP 108/58 | HR 72 | Ht 65.75 in | Wt 183.1 lb

## 2013-06-01 DIAGNOSIS — R1013 Epigastric pain: Secondary | ICD-10-CM

## 2013-06-01 DIAGNOSIS — I2510 Atherosclerotic heart disease of native coronary artery without angina pectoris: Secondary | ICD-10-CM | POA: Diagnosis not present

## 2013-06-01 DIAGNOSIS — R110 Nausea: Secondary | ICD-10-CM

## 2013-06-01 DIAGNOSIS — R198 Other specified symptoms and signs involving the digestive system and abdomen: Secondary | ICD-10-CM | POA: Diagnosis not present

## 2013-06-01 LAB — CBC WITH DIFFERENTIAL/PLATELET
Basophils Absolute: 0 10*3/uL (ref 0.0–0.1)
Basophils Relative: 0 % (ref 0.0–3.0)
EOS PCT: 1 % (ref 0.0–5.0)
Eosinophils Absolute: 0.1 10*3/uL (ref 0.0–0.7)
HCT: 36.4 % — ABNORMAL LOW (ref 39.0–52.0)
Hemoglobin: 12.2 g/dL — ABNORMAL LOW (ref 13.0–17.0)
Lymphocytes Relative: 13 % (ref 12.0–46.0)
Lymphs Abs: 1.1 10*3/uL (ref 0.7–4.0)
MCHC: 33.6 g/dL (ref 30.0–36.0)
MCV: 96 fl (ref 78.0–100.0)
MONOS PCT: 11.1 % (ref 3.0–12.0)
Monocytes Absolute: 0.9 10*3/uL (ref 0.1–1.0)
NEUTROS PCT: 74.9 % (ref 43.0–77.0)
Neutro Abs: 6.1 10*3/uL (ref 1.4–7.7)
Platelets: 197 10*3/uL (ref 150.0–400.0)
RBC: 3.8 Mil/uL — ABNORMAL LOW (ref 4.22–5.81)
RDW: 13.9 % (ref 11.5–14.6)
WBC: 8.2 10*3/uL (ref 4.5–10.5)

## 2013-06-01 LAB — HEPATIC FUNCTION PANEL
ALT: 43 U/L (ref 0–53)
AST: 41 U/L — AB (ref 0–37)
Albumin: 3.7 g/dL (ref 3.5–5.2)
Alkaline Phosphatase: 43 U/L (ref 39–117)
BILIRUBIN DIRECT: 0.1 mg/dL (ref 0.0–0.3)
BILIRUBIN TOTAL: 0.9 mg/dL (ref 0.3–1.2)
TOTAL PROTEIN: 5.6 g/dL — AB (ref 6.0–8.3)

## 2013-06-01 LAB — LIPASE: Lipase: 31 U/L (ref 11.0–59.0)

## 2013-06-01 MED ORDER — NYSTATIN 100000 UNIT/ML MT SUSP: OROMUCOSAL | Status: DC

## 2013-06-01 NOTE — Patient Instructions (Addendum)
Please go to the basement level to have your labs drawn.  We scheduled the Ultrasound for tomorrow 06-02-2013 at 8:00 am . Arrive at 7:45 am.  Go to patient registration insdie front door of [facility].  Have nothing by mouth after midnight.  We sent a prescription for Nystatin solution  to [facility].  Take as directed.  Take nexium 40 mg twice daily for 14 days then go back to once daily. Samples provided.

## 2013-06-01 NOTE — Telephone Encounter (Signed)
Pt states he is having problems with abdominal pain and bloating. States it has gotten really bad the past couple of days. Pt requesting to be seen. Pt scheduled to see Nicoletta Ba PA today at 11am. Pt aware of appt.

## 2013-06-01 NOTE — Progress Notes (Signed)
Subjective:    Patient ID: [name], male    DOB: [birth year], 73 y.o.   MRN: [id number]  HPI  [name] is a pleasant 73 year old white male known to Dr. Deatra Ina. He has multiple medical issues including history of coronary artery disease, CMLfor which he is on [place]., Hypertension, diverticulosis, diabetes mellitus, history of DVT and PE on chronic anticoagulation with Coumadin. He also has history of myasthenia gravis which is primarily affected his eyes. Patient comes in today with complaints of 2-3 day history of persistent fairly constant epigastric and subxiphoid pain. He says this was very uncomfortable last evening and rates his pain had an 8 or 9. He says he almost went to the emergency room. He describes it as a constant discomfort with occasional sharp jabbing pain. He says his appetite has been decreased and he is only eating very small amounts. He says he takes 45 bites and this seems to exacerbate his pain and he feels full and uncomfortable. He says if he does eat she definitely feels worse afterward and the pain may last for several hours after that. He's had also increase in belching and burping indigestion and some vague sense of fullness in his chest. Is no discrete complaints of dysphagia or odynophagia currently. No nausea or vomiting no fever or chills. He is on Nexium 40 mg once daily chronically.  Patient has also been taking steroids for an exacerbation of his myasthenia and had taken 2 courses of antibiotics within the past month for pneumonia. Last colonoscopy was done in 2010 he a 4 mm sessile polyp and multiple diverticuli. Last EGD March 2014 with [facility] dilation of distal esophageal stricture. Patient had CT of the pelvis done in January of 2015 because of tender inguinal  lymph nodes there is no pelvic adenopathy noted at that time- he did have advanced atherosclerotic changes and diverticulosis. Recent CT of the chest done for complaints of intermittent chest wall pain  showed no acute findings no evidence of recurrent pulmonary emboli , stable mild chronic lung disease    Review of Systems  Constitutional: Positive for appetite change.  HENT: Negative.   Eyes: Negative.   Respiratory: Negative.   Gastrointestinal: Positive for abdominal pain.  Endocrine: Negative.   Genitourinary: Negative.   Musculoskeletal: Positive for back pain.  Skin: Negative.   Allergic/Immunologic: Negative.   Neurological: Negative.   Hematological: Negative.   Psychiatric/Behavioral: Negative.    Outpatient Prescriptions Prior to Visit  Medication Sig Dispense Refill  . esomeprazole (NEXIUM) 40 MG capsule Take 40 mg by mouth daily.      Marland Kitchen imatinib (GLEEVEC) 400 MG tablet Take 400 mg by mouth daily.       . isosorbide mononitrate (IMDUR) 30 MG 24 hr tablet Take 1 tablet (30 mg total) by mouth daily.  90 tablet  3  . lisinopril (PRINIVIL,ZESTRIL) 5 MG tablet Take 1 tablet (5 mg total) by mouth daily.  90 tablet  3  . loperamide (IMODIUM) 2 MG capsule Take 2 mg by mouth daily. For diarrhea      . LORazepam (ATIVAN) 1 MG tablet Take 1 tablet (1 mg total) by mouth at bedtime. As needed  30 tablet  1  . metFORMIN (GLUCOPHAGE-XR) 500 MG 24 hr tablet TAKE 1 TABLET BY MOUTH EVERY DAY IN THE MORNING  90 tablet  0  . metoprolol succinate (TOPROL-XL) 25 MG 24 hr tablet Take 1 tablet (25 mg total) by mouth daily.  30 tablet  6  .  Multiple Vitamin (MULTIVITAMIN) tablet Take 1 tablet by mouth daily.      . Multiple Vitamins-Minerals (OCUVITE PO) Take 1 tablet by mouth daily.      . ondansetron (ZOFRAN) 4 MG tablet Take 1 tablet (4 mg total) by mouth every 12 (twelve) hours as needed for nausea.  20 tablet  2  . predniSONE (DELTASONE) 5 MG tablet Take 10 mg by mouth daily.       . promethazine (PHENERGAN) 25 MG tablet Take 12.5 mg by mouth every 6 (six) hours as needed for nausea.      Marland Kitchen pyridostigmine (MESTINON) 60 MG tablet Take 1 tablet (60 mg total) by mouth 3 (three) times daily.   90 tablet  5  . rosuvastatin (CRESTOR) 10 MG tablet Take 0.5 tablets (5 mg total) by mouth at bedtime.  1 tablet  0  . traMADol (ULTRAM) 50 MG tablet Take 1 tablet (50 mg total) by mouth every 4 (four) hours as needed for pain. For pain  60 tablet  1  . warfarin (COUMADIN) 5 MG tablet TAKE 1 TABLET EVERY DAY AS DIRECTED  30 tablet  3  . ibuprofen (ADVIL,MOTRIN) 800 MG tablet Take 800 mg by mouth as needed.       No facility-administered medications prior to visit.   Allergies  Allergen Reactions  . Oxycodone-Acetaminophen Other (See Comments)    To Strong  . Percodan [Oxycodone-Aspirin] Rash    To strong   . Sulfamethoxazole Rash and Itching   Patient Active Problem List   Diagnosis Date Noted  . Bilateral carotid bruits 05/24/2013  . Abnormal CT scan, lung 05/24/2013  . Coronary atherosclerosis of native coronary artery 05/15/2013  . Anterior chest wall pain 05/11/2013  . Encounter for therapeutic drug monitoring 03/14/2013  . Lymphadenopathy, inguinal 01/11/2013  . Dehydration 06/07/2012  . Subtherapeutic international normalized ratio (INR) 06/07/2012  . 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 Myasthenia gravis with exacerbation 03/31/2012  . Facial pain 10/28/2011  . Myasthenia gravis 09/23/2011  . GERD with stricture 09/23/2011  . Encounter for long-term (current) use of anticoagulants 08/03/2011  . Pulmonary embolism 07/30/2011  . DVT of lower extremity (deep venous thrombosis) 07/30/2011  . Subconjunctival hemorrhage 06/22/2011  . Acute bronchitis 04/09/2011  . Bilateral conjunctivitis 04/09/2011  . Type II or unspecified type diabetes mellitus without mention of complication, not stated as uncontrolled 12/18/2010  . Hearing loss 08/04/2010  . THRUSH 04/04/2010  . PTOSIS 12/30/2009  . HYPERTENSION 04/24/2009  . HYPERLIPIDEMIA, MIXED 10/05/2008  . HEMORRHOIDS-INTERNAL 07/02/2008  . HEMORRHOIDS-EXTERNAL 07/02/2008  . DIVERTICULOSIS-COLON 07/02/2008  .  PERSONAL HX COLONIC POLYPS 07/02/2008  . RASH-NONVESICULAR 02/07/2008  . PNEUMONIA 12/08/2007  . LEUKEMIA, MYELOID, CHRONIC 07/07/2007  . CORONARY ARTERY DISEASE 07/07/2007  . NEPHROLITHIASIS, HX OF 07/07/2007   History  Substance Use Topics  . Smoking status: Never Smoker   . Smokeless tobacco: Never Used  . Alcohol Use: No   family history includes Breast cancer in his sister; Clotting disorder in his sister; Dementia in his sister; Heart attack in his father, mother, sister, sister, and sister; Heart disease in his father, mother, and sister; Stroke in his father and mother.     Objective:   Physical Exam  well-developed older white male in no acute distress, accompanied by his wife blood pressure 108/58 pulse 72 height 5 foot 5 weight 183. HEENT; nontraumatic, normocephalic, EOMI PERRLA he does have some slight lid drooping bilaterally, oropharynx yellowish coating on the tongue no definite  plaques on the buccal mucosa Neck supple no JVD, Cardiovascular; regular rate and rhythm with S1-S2 no murmur or gallop, Pulmonary; clear bilaterally, Abdomen ;soft bowel sounds are present, he has no real focal tenderness in the epigastrium or subxiphoid area, there is no tenderness over the xiphoid process or the lower anterior or ribs no palpable mass or hepatosplenomegaly bowel sounds are present, Rectal; exam not done, Ext; no clubbing cyanosis or edema skin warm and dry, Psych ;mood and affect appropriate        Assessment & Plan:  #26  73 year old male with 3 day history of persistent epigastric/subxiphoid pain exacerbated by eating. 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 #3 coronary artery disease -patient scheduled for stress testing next week #4  diverticulosis #5 history of colon polyps, due for followup colonoscopy 2015 #6 history of chronic GERD and distal esophageal stricture status post prior dilations #7 history of DVT and pulmonary emboli on chronic Coumadin #8 diabetes mellitus #9 myasthenia gravis  Plan; increase Nexium to 40 mg by mouth twice daily x2 weeks Start Mycostatin oral suspension 5 cc swish and swallow 4 times daily x10 days Schedule for upper abdominal ultrasound today or tomorrow CBC with differential, lipase and hepatic panel Patient was advised he could take Ultram which he has at home as needed for pain If labs and ultrasound are unremarkable he will need EGD-(patient on Coumadin). Patient is also interested in having his colonoscopy done at that same time as he is due for followup-

## 2013-06-02 ENCOUNTER — Ambulatory Visit (HOSPITAL_COMMUNITY)
Admission: RE | Admit: 2013-06-02 | Discharge: 2013-06-02 | Disposition: A | Discharge status: Home / Self Care | Payer: Medicare Other | Source: Ambulatory Visit | Attending: Physician Assistant | Admitting: Physician Assistant

## 2013-06-02 DIAGNOSIS — R1013 Epigastric pain: Secondary | ICD-10-CM | POA: Diagnosis not present

## 2013-06-02 DIAGNOSIS — R110 Nausea: Secondary | ICD-10-CM

## 2013-06-02 DIAGNOSIS — R198 Other specified symptoms and signs involving the digestive system and abdomen: Secondary | ICD-10-CM

## 2013-06-02 NOTE — Progress Notes (Signed)
Reviewed and agree with management. Robert D. Kaplan, M.D., FACG  

## 2013-06-05 ENCOUNTER — Other Ambulatory Visit: Payer: Self-pay

## 2013-06-05 DIAGNOSIS — R972 Elevated prostate specific antigen [PSA]: Secondary | ICD-10-CM | POA: Diagnosis not present

## 2013-06-05 MED ORDER — METFORMIN HCL ER 500 MG PO TB24: ORAL_TABLET | ORAL | Status: DC

## 2013-06-06 ENCOUNTER — Telehealth: Payer: Self-pay | Admitting: Physician Assistant

## 2013-06-06 NOTE — Telephone Encounter (Signed)
Spoke with pt and discussed results with him.

## 2013-06-07 ENCOUNTER — Ambulatory Visit (HOSPITAL_COMMUNITY): Payer: Medicare Other | Attending: Cardiovascular Disease | Admitting: Radiology

## 2013-06-07 VITALS — BP 134/91 | HR 64 | Ht 67.0 in | Wt 183.0 lb

## 2013-06-07 DIAGNOSIS — R079 Chest pain, unspecified: Secondary | ICD-10-CM

## 2013-06-07 DIAGNOSIS — Z8249 Family history of ischemic heart disease and other diseases of the circulatory system: Secondary | ICD-10-CM | POA: Diagnosis not present

## 2013-06-07 DIAGNOSIS — E119 Type 2 diabetes mellitus without complications: Secondary | ICD-10-CM | POA: Diagnosis not present

## 2013-06-07 DIAGNOSIS — I2510 Atherosclerotic heart disease of native coronary artery without angina pectoris: Secondary | ICD-10-CM | POA: Insufficient documentation

## 2013-06-07 DIAGNOSIS — R0789 Other chest pain: Secondary | ICD-10-CM | POA: Insufficient documentation

## 2013-06-07 DIAGNOSIS — R931 Abnormal findings on diagnostic imaging of heart and coronary circulation: Secondary | ICD-10-CM

## 2013-06-07 DIAGNOSIS — I10 Essential (primary) hypertension: Secondary | ICD-10-CM | POA: Diagnosis not present

## 2013-06-07 MED ORDER — TECHNETIUM TC 99M SESTAMIBI GENERIC - CARDIOLITE
33.0000 | Freq: Once | INTRAVENOUS | Status: AC | PRN
  Administered 2013-06-07: 33 via INTRAVENOUS

## 2013-06-07 MED ORDER — TECHNETIUM TC 99M SESTAMIBI GENERIC - CARDIOLITE
11.0000 | Freq: Once | INTRAVENOUS | Status: AC | PRN
Start: 2013-06-07 — End: 2013-06-07
  Administered 2013-06-07: 11 via INTRAVENOUS

## 2013-06-07 NOTE — Progress Notes (Signed)
[facility] [address] [place], [place] [address] [phone]    Cardiology Nuclear Med Study  [name] is a 73 y.o. male     MRN : [id number]     DOB: 1940/10/23  Procedure Date: 06/07/2013  Nuclear Med Background Indication for Stress Test:  Evaluation for Ischemia History:  CAD, Cath (n/o dz), MPI 2013 (normal) EF 70% Cardiac Risk Factors: Family History - CAD, Hypertension, Lipids and NIDDM  Symptoms:  Chest Pressure   Nuclear Pre-Procedure Caffeine/Decaff Intake:  None NPO After: 10:00pm   Lungs:  clear O2 Sat: 97% on room air. IV 0.9% NS with Angio Cath:  22g  IV Site: R Hand  IV Started by:  Crissie Figures, RN  Chest Size (in):  46 Cup Size: n/a  Height: 5\' 7"  (1.702 m)  Weight:  183 lb (83.008 kg)  BMI:  Body mass index is 28.66 kg/(m^2). Tech Comments: Toprol held x 24hrs    Nuclear Med Study 1 or 2 day study: 1 day  Stress Test Type:  Stress  Reading MD: N/A  Order Authorizing Provider:  Sherren Mocha, MD  Resting Radionuclide: Technetium 46m Sestamibi  Resting Radionuclide Dose: 11.0 mCi   Stress Radionuclide:  Technetium 13m Sestamibi  Stress Radionuclide Dose: 33.0 mCi           Stress Protocol Rest HR: 64 Stress HR: 137  Rest BP: 134/91 Stress BP: 185/77  Exercise Time (min): 7:00 METS: 7.8           Dose of Adenosine (mg):  n/a Dose of Lexiscan: n/a mg  Dose of Atropine (mg): n/a Dose of Dobutamine: n/a mcg/kg/min (at max HR)  Stress Test Technologist: Glade Lloyd, BS-ES  Nuclear Technologist:  Annye Rusk, CNMT     Rest Procedure:  Myocardial perfusion imaging was performed at rest 45 minutes following the intravenous administration of Technetium 22m Sestamibi. Rest ECG: NSR - Normal EKG  Stress Procedure:  The patient exercised on the treadmill utilizing the Bruce Protocol for 7:00 minutes. The patient stopped due to SOB, leg fatigue and denied any chest pain.  Technetium 69m Sestamibi was injected at peak  exercise and myocardial perfusion imaging was performed after a brief delay. Stress ECG: Significant ST abnormalities consistent with ischemia. 1 mm horizontal ST depression in V5-V6 and < 1 mm in the inferior leads  QPS Raw Data Images:  Normal; no motion artifact; normal heart/lung ratio. Stress Images:  Normal homogeneous uptake in all areas of the myocardium. Rest Images:  Normal homogeneous uptake in all areas of the myocardium. Subtraction (SDS):  No evidence of ischemia. Transient Ischemic Dilatation (Normal <1.22):  0.91 Lung/Heart Ratio (Normal <0.45):  0.28  Quantitative Gated Spect Images QGS EDV:  88 ml QGS ESV:  27 ml  Impression Exercise Capacity:  Fair exercise capacity. BP Response:  Normal blood pressure response. Clinical Symptoms:  The exercise was limited by dyspnea and fatigue. ECG Impression:  Significant ST abnormalities consistent with ischemia. PVCs and rare couplets are seen. Comparison with Prior Nuclear Study: No significant change from previous study (previous study was a [place] pharmacological stress test).  Overall Impression:  Low risk stress nuclear study with ECG abnormalities that may represent a "false positive ECG stress test".  LV Ejection Fraction: 69%.  LV Wall Motion:  NL LV Function; NL Wall Motion  Sanda Klein, MD, [facility] [phone] office [phone] pager

## 2013-06-12 ENCOUNTER — Institutional Professional Consult (permissible substitution): Payer: Medicare Other | Admitting: Internal Medicine

## 2013-06-12 ENCOUNTER — Ambulatory Visit (INDEPENDENT_AMBULATORY_CARE_PROVIDER_SITE_OTHER): Payer: Medicare Other | Admitting: Internal Medicine

## 2013-06-12 ENCOUNTER — Encounter: Payer: Self-pay | Admitting: Internal Medicine

## 2013-06-12 VITALS — BP 130/70 | HR 72 | Temp 98.3°F | Ht 67.0 in | Wt 189.6 lb

## 2013-06-12 DIAGNOSIS — R059 Cough, unspecified: Secondary | ICD-10-CM | POA: Diagnosis not present

## 2013-06-12 DIAGNOSIS — R05 Cough: Secondary | ICD-10-CM | POA: Diagnosis not present

## 2013-06-12 DIAGNOSIS — R058 Other specified cough: Secondary | ICD-10-CM

## 2013-06-12 DIAGNOSIS — R918 Other nonspecific abnormal finding of lung field: Secondary | ICD-10-CM

## 2013-06-12 DIAGNOSIS — N138 Other obstructive and reflux uropathy: Secondary | ICD-10-CM | POA: Diagnosis not present

## 2013-06-12 DIAGNOSIS — I2510 Atherosclerotic heart disease of native coronary artery without angina pectoris: Secondary | ICD-10-CM

## 2013-06-12 DIAGNOSIS — N401 Enlarged prostate with lower urinary tract symptoms: Secondary | ICD-10-CM | POA: Diagnosis not present

## 2013-06-12 MED ORDER — VALSARTAN 80 MG PO TABS: 80.0000 mg | ORAL_TABLET | Freq: Every day | ORAL | Status: DC

## 2013-06-12 NOTE — Patient Instructions (Addendum)
Nexium 40 mg   Take 30-60 min before first meal of the day and Pepcid 20 mg one bedtime until better   Stop lisinopril and start diovan 80 mg daily in its place  GERD (REFLUX)  is an extremely common cause of respiratory symptoms, many times with no significant heartburn at all.    It can be treated with medication, but also with lifestyle changes including avoidance of late meals, excessive alcohol, smoking cessation, and avoid fatty foods, chocolate, peppermint, colas, red wine, and acidic juices such as orange juice.  NO MINT OR MENTHOL PRODUCTS SO NO COUGH DROPS  USE SUGARLESS CANDY INSTEAD (jolley ranchers or Stover's or [employer]on[employer]) NO OIL BASED VITAMINS - use powdered substitutes.  If not 100% better in 6 weeks return here for further work up

## 2013-06-12 NOTE — Progress Notes (Signed)
Subjective:    Patient ID: [name], male    DOB: 03-Dec-1940   MRN: [id number]  HPI  75 yowm never smoker with tendency to recurrent bronchitis maybe once or twice a year with onset of cough abruptly last week in Feb 2015 got worse first week in March then UC eval in [place] some better  rx ? Zpak/  amoxicillin then referred by Dr Loanne Drilling to pulmonary clinic 06/12/2013 p ct chest was done which was abn and cough persisted   06/12/2013 1st [facility] office visit/ Joslynne Klatt  Chief Complaint  Patient presents with  . Pulmonary Consult    Referred per Dr. Loanne Drilling for eval of abnormal CT Chest. Pt states that he had PNA beginning of March 2015. He c/o non prod cough x 3 wks.    Acute onset of cough in March 2015 and not right since and assoc doe has developed assoc also with HB.  Cough present daily non-productive on ACEi On prednisone for 4-5 years   No obvious other patterns in day to day or daytime variabilty or assoc sob or cp or chest tightness, subjective wheeze overt sinus or hb symptoms. No unusual exp hx or h/o childhood pna/ asthma or knowledge of premature birth.  Sleeping ok without nocturnal  or early am exacerbation  of respiratory  c/o's or need for noct saba. Also denies any obvious fluctuation of symptoms with weather or environmental changes or other aggravating or alleviating factors except as outlined above   Current Medications, Allergies, Complete Past Medical History, Past Surgical History, Family History, and Social History were reviewed in [employer] record.              Review of Systems  Constitutional: Negative for fever, chills, activity change, appetite change and unexpected weight change.  HENT: Negative for congestion, dental problem, postnasal drip, rhinorrhea, sneezing, sore throat, trouble swallowing and voice change.   Eyes: Negative for visual disturbance.  Respiratory: Positive for cough. Negative for choking and  shortness of breath.   Cardiovascular: Negative for chest pain and leg swelling.  Gastrointestinal: Negative for nausea, vomiting and abdominal pain.  Genitourinary: Negative for difficulty urinating.       Heartburn Indigestion  Musculoskeletal: Negative for arthralgias.  Skin: Negative for rash.  Psychiatric/Behavioral: Negative for behavioral problems and confusion.       Objective:   Physical Exam  Wt Readings from Last 3 Encounters:  06/12/13 189 lb 9.6 oz (86.002 kg)  06/07/13 183 lb (83.008 kg)  06/01/13 183 lb 2 oz (83.065 kg)     Hoarse pleasant amb wm with freq throat clearing.  HEENT: nl dentition, turbinates, and orophanx. Nl external ear canals without cough reflex   NECK :  without JVD/Nodes/TM/ nl carotid upstrokes bilaterally   LUNGS: no acc muscle use, clear to A and P bilaterally without cough on insp or exp maneuvers   CV:  RRR  no s3 or murmur or increase in P2, no edema   ABD:  soft and nontender with nl excursion in the supine position. No bruits or organomegaly, bowel sounds nl  MS:  warm without deformities, calf tenderness, cyanosis or clubbing  SKIN: warm and dry without lesions    NEURO:  alert, approp, no deficits    Chest CT 05/15/13 1. No acute chest findings demonstrated. There is no evidence of  recurrent pulmonary embolism on non dedicated imaging.  2. Diffuse atherosclerosis with significant involvement of the  coronary arteries. This  could potentially relate to the given  symptoms.  3. No chest wall abnormality identified.  4. Stable mild chronic lung disease.       Assessment & Plan:

## 2013-06-13 DIAGNOSIS — R05 Cough: Secondary | ICD-10-CM | POA: Insufficient documentation

## 2013-06-13 DIAGNOSIS — R058 Other specified cough: Secondary | ICD-10-CM | POA: Insufficient documentation

## 2013-06-13 NOTE — Assessment & Plan Note (Signed)
The most common causes of chronic cough in immunocompetent adults include the following: upper airway cough syndrome (UACS), previously referred to as postnasal drip syndrome (PNDS), which is caused by variety of rhinosinus conditions; (2) asthma; (3) GERD; (4) chronic bronchitis from cigarette smoking or other inhaled environmental irritants; (5) nonasthmatic eosinophilic bronchitis; and (6) bronchiectasis.   These conditions, singly or in combination, have accounted for up to 94% of the causes of chronic cough in prospective studies.   Other conditions have constituted no >6% of the causes in prospective studies These have included bronchogenic carcinoma, chronic interstitial pneumonia, sarcoidosis, left ventricular failure, ACEI-induced cough, and aspiration from a condition associated with pharyngeal dysfunction.    Chronic cough is often simultaneously caused by more than one condition. A single cause has been found from 38 to 82% of the time, multiple causes from 18 to 62%. Multiply caused cough has been the result of three diseases up to 42% of the time.       Based on hx and exam, this is most likely:  Classic Upper airway cough syndrome, so named because it's frequently impossible to sort out how much is  CR/sinusitis with freq throat clearing (which can be related to primary GERD)   vs  causing  secondary (" extra esophageal")  GERD from wide swings in gastric pressure that occur with throat clearing, often  promoting self use of mint and menthol lozenges that reduce the lower esophageal sphincter tone and exacerbate the problem further in a cyclical fashion.   These are the same pts (now being labeled as having "irritable larynx syndrome" by some cough centers) who not infrequently have a history of having failed to tolerate ace inhibitors,  dry powder inhalers or biphosphonates or report having atypical reflux symptoms that don't respond to standard doses of PPI , and are easily confused as  having aecopd or asthma flares by even experienced allergists/ pulmonologists.   The first step is to maximize acid suppression and eliminate cyclical coughing then regroup if the cough persists.  See instructions for specific recommendations which were reviewed directly with the patient who was given a copy with highlighter outlining the key components.    For now try off acei and then regroup in 6 weeks if not better

## 2013-06-13 NOTE — Assessment & Plan Note (Signed)
Reviewed with pt, no acute changes and not related to his cough, no further studies needed

## 2013-06-15 ENCOUNTER — Telehealth: Payer: Self-pay | Admitting: Internal Medicine

## 2013-06-15 DIAGNOSIS — J189 Pneumonia, unspecified organism: Secondary | ICD-10-CM | POA: Diagnosis not present

## 2013-06-15 NOTE — Telephone Encounter (Signed)
Called spoke with pt. He reports he is on his way to UC right now as we speak. He is not feeling well. He reports if he is not satisfied with them he will call us for appt. Appt was offered to pt but stated he will go to UC first. Will sign off message

## 2013-06-17 ENCOUNTER — Encounter (HOSPITAL_COMMUNITY): Payer: Self-pay | Admitting: Emergency Medicine

## 2013-06-17 ENCOUNTER — Inpatient Hospital Stay (HOSPITAL_COMMUNITY)
Admission: EM | Admit: 2013-06-17 | Discharge: 2013-06-20 | DRG: 191 | Disposition: A | Discharge status: Home / Self Care | Payer: Medicare Other | Attending: Internal Medicine | Admitting: Internal Medicine

## 2013-06-17 ENCOUNTER — Emergency Department (HOSPITAL_COMMUNITY): Payer: Medicare Other

## 2013-06-17 DIAGNOSIS — IMO0002 Reserved for concepts with insufficient information to code with codable children: Secondary | ICD-10-CM | POA: Diagnosis not present

## 2013-06-17 DIAGNOSIS — I10 Essential (primary) hypertension: Secondary | ICD-10-CM | POA: Diagnosis not present

## 2013-06-17 DIAGNOSIS — I82409 Acute embolism and thrombosis of unspecified deep veins of unspecified lower extremity: Secondary | ICD-10-CM | POA: Diagnosis not present

## 2013-06-17 DIAGNOSIS — K219 Gastro-esophageal reflux disease without esophagitis: Secondary | ICD-10-CM | POA: Diagnosis present

## 2013-06-17 DIAGNOSIS — C9211 Chronic myeloid leukemia, BCR/ABL-positive, in remission: Secondary | ICD-10-CM | POA: Diagnosis not present

## 2013-06-17 DIAGNOSIS — J209 Acute bronchitis, unspecified: Secondary | ICD-10-CM | POA: Diagnosis not present

## 2013-06-17 DIAGNOSIS — Z87442 Personal history of urinary calculi: Secondary | ICD-10-CM

## 2013-06-17 DIAGNOSIS — R062 Wheezing: Secondary | ICD-10-CM | POA: Diagnosis present

## 2013-06-17 DIAGNOSIS — J441 Chronic obstructive pulmonary disease with (acute) exacerbation: Secondary | ICD-10-CM | POA: Diagnosis not present

## 2013-06-17 DIAGNOSIS — J440 Chronic obstructive pulmonary disease with acute lower respiratory infection: Principal | ICD-10-CM | POA: Diagnosis present

## 2013-06-17 DIAGNOSIS — E860 Dehydration: Secondary | ICD-10-CM | POA: Diagnosis not present

## 2013-06-17 DIAGNOSIS — R0602 Shortness of breath: Secondary | ICD-10-CM | POA: Diagnosis not present

## 2013-06-17 DIAGNOSIS — R5381 Other malaise: Secondary | ICD-10-CM | POA: Diagnosis not present

## 2013-06-17 DIAGNOSIS — C9210 Chronic myeloid leukemia, BCR/ABL-positive, not having achieved remission: Secondary | ICD-10-CM | POA: Diagnosis not present

## 2013-06-17 DIAGNOSIS — Z860100 Personal history of colon polyps, unspecified: Secondary | ICD-10-CM

## 2013-06-17 DIAGNOSIS — R058 Other specified cough: Secondary | ICD-10-CM | POA: Diagnosis present

## 2013-06-17 DIAGNOSIS — K222 Esophageal obstruction: Secondary | ICD-10-CM | POA: Diagnosis present

## 2013-06-17 DIAGNOSIS — Z86711 Personal history of pulmonary embolism: Secondary | ICD-10-CM | POA: Diagnosis not present

## 2013-06-17 DIAGNOSIS — E119 Type 2 diabetes mellitus without complications: Secondary | ICD-10-CM | POA: Diagnosis not present

## 2013-06-17 DIAGNOSIS — G7000 Myasthenia gravis without (acute) exacerbation: Secondary | ICD-10-CM | POA: Diagnosis not present

## 2013-06-17 DIAGNOSIS — Z7901 Long term (current) use of anticoagulants: Secondary | ICD-10-CM | POA: Diagnosis not present

## 2013-06-17 DIAGNOSIS — J42 Unspecified chronic bronchitis: Secondary | ICD-10-CM

## 2013-06-17 DIAGNOSIS — R5383 Other fatigue: Secondary | ICD-10-CM | POA: Diagnosis not present

## 2013-06-17 DIAGNOSIS — E782 Mixed hyperlipidemia: Secondary | ICD-10-CM | POA: Diagnosis present

## 2013-06-17 DIAGNOSIS — R05 Cough: Secondary | ICD-10-CM | POA: Diagnosis present

## 2013-06-17 DIAGNOSIS — J9819 Other pulmonary collapse: Secondary | ICD-10-CM | POA: Diagnosis not present

## 2013-06-17 NOTE — ED Notes (Signed)
Patient is alert and oriented x3.  He is complaining of shortness of breath for about 2 months.  Patient States he had Pneumonia about 2 months ago and has been on antibiotics with no relief from his symptoms.   Currently he denies pain but states he does have tightness in his airway with a productive that he describes  As white and thick.

## 2013-06-18 ENCOUNTER — Encounter (HOSPITAL_COMMUNITY): Payer: Self-pay | Admitting: *Deleted

## 2013-06-18 DIAGNOSIS — I10 Essential (primary) hypertension: Secondary | ICD-10-CM

## 2013-06-18 DIAGNOSIS — I82409 Acute embolism and thrombosis of unspecified deep veins of unspecified lower extremity: Secondary | ICD-10-CM

## 2013-06-18 DIAGNOSIS — E119 Type 2 diabetes mellitus without complications: Secondary | ICD-10-CM

## 2013-06-18 DIAGNOSIS — R059 Cough, unspecified: Secondary | ICD-10-CM

## 2013-06-18 DIAGNOSIS — G7000 Myasthenia gravis without (acute) exacerbation: Secondary | ICD-10-CM

## 2013-06-18 DIAGNOSIS — J209 Acute bronchitis, unspecified: Secondary | ICD-10-CM

## 2013-06-18 DIAGNOSIS — R05 Cough: Secondary | ICD-10-CM

## 2013-06-18 DIAGNOSIS — J42 Unspecified chronic bronchitis: Secondary | ICD-10-CM

## 2013-06-18 DIAGNOSIS — C9210 Chronic myeloid leukemia, BCR/ABL-positive, not having achieved remission: Secondary | ICD-10-CM

## 2013-06-18 LAB — CBC WITH DIFFERENTIAL/PLATELET
BASOS PCT: 1 % (ref 0–1)
Basophils Absolute: 0.1 10*3/uL (ref 0.0–0.1)
EOS ABS: 0.2 10*3/uL (ref 0.0–0.7)
Eosinophils Relative: 3 % (ref 0–5)
HEMATOCRIT: 38 % — AB (ref 39.0–52.0)
HEMOGLOBIN: 12.7 g/dL — AB (ref 13.0–17.0)
Lymphocytes Relative: 22 % (ref 12–46)
Lymphs Abs: 1.5 10*3/uL (ref 0.7–4.0)
MCH: 31.8 pg (ref 26.0–34.0)
MCHC: 33.4 g/dL (ref 30.0–36.0)
MCV: 95 fL (ref 78.0–100.0)
MONO ABS: 1 10*3/uL (ref 0.1–1.0)
MONOS PCT: 15 % — AB (ref 3–12)
Neutro Abs: 4.2 10*3/uL (ref 1.7–7.7)
Neutrophils Relative %: 60 % (ref 43–77)
Platelets: 199 10*3/uL (ref 150–400)
RBC: 4 MIL/uL — ABNORMAL LOW (ref 4.22–5.81)
RDW: 13.8 % (ref 11.5–15.5)
WBC: 6.9 10*3/uL (ref 4.0–10.5)

## 2013-06-18 LAB — BASIC METABOLIC PANEL
BUN: 14 mg/dL (ref 6–23)
CO2: 27 mEq/L (ref 19–32)
Calcium: 9 mg/dL (ref 8.4–10.5)
Chloride: 101 mEq/L (ref 96–112)
Creatinine, Ser: 1.6 mg/dL — ABNORMAL HIGH (ref 0.50–1.35)
GFR, EST AFRICAN AMERICAN: 47 mL/min — AB (ref >90)
GFR, EST NON AFRICAN AMERICAN: 41 mL/min — AB (ref >90)
Glucose, Bld: 112 mg/dL — ABNORMAL HIGH (ref 70–99)
Potassium: 4.3 mEq/L (ref 3.7–5.3)
Sodium: 139 mEq/L (ref 137–147)

## 2013-06-18 LAB — COMPREHENSIVE METABOLIC PANEL
ALT: 23 U/L (ref 0–53)
AST: 26 U/L (ref 0–37)
Albumin: 3.2 g/dL — ABNORMAL LOW (ref 3.5–5.2)
Alkaline Phosphatase: 48 U/L (ref 39–117)
BUN: 13 mg/dL (ref 6–23)
CALCIUM: 8.3 mg/dL — AB (ref 8.4–10.5)
CO2: 23 meq/L (ref 19–32)
CREATININE: 1.45 mg/dL — AB (ref 0.50–1.35)
Chloride: 102 mEq/L (ref 96–112)
GFR, EST AFRICAN AMERICAN: 53 mL/min — AB (ref >90)
GFR, EST NON AFRICAN AMERICAN: 46 mL/min — AB (ref >90)
Glucose, Bld: 145 mg/dL — ABNORMAL HIGH (ref 70–99)
Potassium: 4.1 mEq/L (ref 3.7–5.3)
Sodium: 140 mEq/L (ref 137–147)
TOTAL PROTEIN: 5.8 g/dL — AB (ref 6.0–8.3)
Total Bilirubin: 0.4 mg/dL (ref 0.3–1.2)

## 2013-06-18 LAB — EXPECTORATED SPUTUM ASSESSMENT W GRAM STAIN, RFLX TO RESP C

## 2013-06-18 LAB — PRO B NATRIURETIC PEPTIDE: Pro B Natriuretic peptide (BNP): 59.6 pg/mL (ref 0–450)

## 2013-06-18 LAB — CBC
HEMATOCRIT: 35.7 % — AB (ref 39.0–52.0)
Hemoglobin: 12 g/dL — ABNORMAL LOW (ref 13.0–17.0)
MCH: 32 pg (ref 26.0–34.0)
MCHC: 33.6 g/dL (ref 30.0–36.0)
MCV: 95.2 fL (ref 78.0–100.0)
PLATELETS: 166 10*3/uL (ref 150–400)
RBC: 3.75 MIL/uL — AB (ref 4.22–5.81)
RDW: 13.8 % (ref 11.5–15.5)
WBC: 6 10*3/uL (ref 4.0–10.5)

## 2013-06-18 LAB — PROTIME-INR
INR: 1.59 — ABNORMAL HIGH (ref 0.00–1.49)
INR: 1.75 — AB (ref 0.00–1.49)
Prothrombin Time: 18.5 seconds — ABNORMAL HIGH (ref 11.6–15.2)
Prothrombin Time: 19.9 seconds — ABNORMAL HIGH (ref 11.6–15.2)

## 2013-06-18 LAB — C-REACTIVE PROTEIN: CRP: 2.6 mg/dL — ABNORMAL HIGH (ref <0.60)

## 2013-06-18 LAB — EXPECTORATED SPUTUM ASSESSMENT W REFEX TO RESP CULTURE

## 2013-06-18 LAB — TROPONIN I: Troponin I: <0.3 ng/mL (ref <0.30)

## 2013-06-18 LAB — GLUCOSE, CAPILLARY
GLUCOSE-CAPILLARY: 188 mg/dL — AB (ref 70–99)
GLUCOSE-CAPILLARY: 294 mg/dL — AB (ref 70–99)
Glucose-Capillary: 152 mg/dL — ABNORMAL HIGH (ref 70–99)

## 2013-06-18 LAB — SEDIMENTATION RATE: Sed Rate: 17 mm/hr — ABNORMAL HIGH (ref 0–16)

## 2013-06-18 LAB — STREP PNEUMONIAE URINARY ANTIGEN: Strep Pneumo Urinary Antigen: NEGATIVE

## 2013-06-18 LAB — INFLUENZA PANEL BY PCR (TYPE A & B)
H1N1FLUPCR: NOT DETECTED
INFLAPCR: NEGATIVE
INFLBPCR: NEGATIVE

## 2013-06-18 MED ORDER — ATORVASTATIN CALCIUM 40 MG PO TABS
40.0000 mg | ORAL_TABLET | Freq: Every day | ORAL | Status: DC
  Administered 2013-06-18 – 2013-06-19 (×2): 40 mg via ORAL
  Filled 2013-06-18 (×3): qty 1

## 2013-06-18 MED ORDER — ALBUTEROL SULFATE (2.5 MG/3ML) 0.083% IN NEBU
5.0000 mg | INHALATION_SOLUTION | Freq: Once | RESPIRATORY_TRACT | Status: AC
  Administered 2013-06-18: 5 mg via RESPIRATORY_TRACT
  Filled 2013-06-18: qty 6

## 2013-06-18 MED ORDER — SODIUM CHLORIDE 0.9 % IV BOLUS (SEPSIS)
500.0000 mL | Freq: Once | INTRAVENOUS | Status: AC
  Administered 2013-06-18: 500 mL via INTRAVENOUS

## 2013-06-18 MED ORDER — ENOXAPARIN SODIUM 80 MG/0.8ML ~~LOC~~ SOLN
80.0000 mg | Freq: Two times a day (BID) | SUBCUTANEOUS | Status: DC
  Administered 2013-06-19: 80 mg via SUBCUTANEOUS
  Filled 2013-06-18 (×3): qty 0.8

## 2013-06-18 MED ORDER — PANTOPRAZOLE SODIUM 40 MG PO TBEC
40.0000 mg | DELAYED_RELEASE_TABLET | Freq: Two times a day (BID) | ORAL | Status: DC
  Administered 2013-06-18 – 2013-06-20 (×5): 40 mg via ORAL
  Filled 2013-06-18 (×8): qty 1

## 2013-06-18 MED ORDER — LOPERAMIDE HCL 2 MG PO CAPS
2.0000 mg | ORAL_CAPSULE | ORAL | Status: DC
  Administered 2013-06-18: 2 mg via ORAL
  Filled 2013-06-18 (×4): qty 1

## 2013-06-18 MED ORDER — ISOSORBIDE MONONITRATE ER 30 MG PO TB24
30.0000 mg | ORAL_TABLET | Freq: Every day | ORAL | Status: DC
  Administered 2013-06-18 – 2013-06-20 (×3): 30 mg via ORAL
  Filled 2013-06-18 (×3): qty 1

## 2013-06-18 MED ORDER — SODIUM CHLORIDE 0.9 % IJ SOLN
3.0000 mL | Freq: Two times a day (BID) | INTRAMUSCULAR | Status: DC
  Administered 2013-06-18 – 2013-06-19 (×4): 3 mL via INTRAVENOUS

## 2013-06-18 MED ORDER — ONDANSETRON HCL 4 MG/2ML IJ SOLN: 4.0000 mg | Freq: Four times a day (QID) | INTRAMUSCULAR | Status: DC | PRN

## 2013-06-18 MED ORDER — PREDNISONE 20 MG PO TABS
40.0000 mg | ORAL_TABLET | Freq: Once | ORAL | Status: AC
  Administered 2013-06-18: 40 mg via ORAL
  Filled 2013-06-18: qty 2

## 2013-06-18 MED ORDER — IMATINIB MESYLATE 400 MG PO TABS
400.0000 mg | ORAL_TABLET | Freq: Every day | ORAL | Status: DC
Start: 2013-06-18 — End: 2013-06-20
  Administered 2013-06-18 – 2013-06-20 (×3): 400 mg via ORAL
  Filled 2013-06-18 (×3): qty 1

## 2013-06-18 MED ORDER — FAMOTIDINE 20 MG PO TABS
20.0000 mg | ORAL_TABLET | Freq: Two times a day (BID) | ORAL | Status: DC
  Administered 2013-06-18 – 2013-06-20 (×5): 20 mg via ORAL
  Filled 2013-06-18 (×6): qty 1

## 2013-06-18 MED ORDER — WARFARIN SODIUM 4 MG PO TABS
4.0000 mg | ORAL_TABLET | Freq: Once | ORAL | Status: AC
  Administered 2013-06-18: 4 mg via ORAL
  Filled 2013-06-18 (×2): qty 1

## 2013-06-18 MED ORDER — ONDANSETRON HCL 4 MG PO TABS
4.0000 mg | ORAL_TABLET | Freq: Two times a day (BID) | ORAL | Status: DC | PRN
Start: 2013-06-18 — End: 2013-06-20

## 2013-06-18 MED ORDER — BENZONATATE 100 MG PO CAPS
200.0000 mg | ORAL_CAPSULE | Freq: Three times a day (TID) | ORAL | Status: DC | PRN
  Administered 2013-06-18 – 2013-06-19 (×4): 200 mg via ORAL
  Filled 2013-06-18 (×4): qty 2

## 2013-06-18 MED ORDER — LORAZEPAM 1 MG PO TABS
1.0000 mg | ORAL_TABLET | Freq: Every day | ORAL | Status: DC
  Administered 2013-06-18: 1 mg via ORAL
  Filled 2013-06-18: qty 1

## 2013-06-18 MED ORDER — LEVOFLOXACIN IN D5W 750 MG/150ML IV SOLN
750.0000 mg | Freq: Once | INTRAVENOUS | Status: AC
  Administered 2013-06-18: 750 mg via INTRAVENOUS
  Filled 2013-06-18: qty 150

## 2013-06-18 MED ORDER — IPRATROPIUM-ALBUTEROL 0.5-2.5 (3) MG/3ML IN SOLN
3.0000 mL | Freq: Three times a day (TID) | RESPIRATORY_TRACT | Status: DC
  Administered 2013-06-19 – 2013-06-20 (×4): 3 mL via RESPIRATORY_TRACT
  Filled 2013-06-18 (×3): qty 3

## 2013-06-18 MED ORDER — PREDNISONE 10 MG PO TABS
60.0000 mg | ORAL_TABLET | Freq: Every day | ORAL | Status: DC
  Administered 2013-06-18 – 2013-06-20 (×3): 60 mg via ORAL
  Filled 2013-06-18 (×5): qty 1

## 2013-06-18 MED ORDER — IPRATROPIUM-ALBUTEROL 0.5-2.5 (3) MG/3ML IN SOLN
3.0000 mL | Freq: Once | RESPIRATORY_TRACT | Status: AC
  Administered 2013-06-18: 3 mL via RESPIRATORY_TRACT
  Filled 2013-06-18: qty 3

## 2013-06-18 MED ORDER — INSULIN ASPART 100 UNIT/ML ~~LOC~~ SOLN
0.0000 [IU] | Freq: Three times a day (TID) | SUBCUTANEOUS | Status: DC
Start: 2013-06-18 — End: 2013-06-20
  Administered 2013-06-18: 2 [IU] via SUBCUTANEOUS
  Administered 2013-06-18: 5 [IU] via SUBCUTANEOUS
  Administered 2013-06-18: 2 [IU] via SUBCUTANEOUS
  Administered 2013-06-19: 3 [IU] via SUBCUTANEOUS
  Administered 2013-06-19: 1 [IU] via SUBCUTANEOUS

## 2013-06-18 MED ORDER — INSULIN ASPART 100 UNIT/ML ~~LOC~~ SOLN
0.0000 [IU] | Freq: Every day | SUBCUTANEOUS | Status: DC
  Administered 2013-06-19: 2 [IU] via SUBCUTANEOUS

## 2013-06-18 MED ORDER — IPRATROPIUM-ALBUTEROL 0.5-2.5 (3) MG/3ML IN SOLN
3.0000 mL | RESPIRATORY_TRACT | Status: DC
  Administered 2013-06-18 (×3): 3 mL via RESPIRATORY_TRACT
  Filled 2013-06-18 (×3): qty 3

## 2013-06-18 MED ORDER — ENOXAPARIN SODIUM 80 MG/0.8ML ~~LOC~~ SOLN
75.0000 mg | SUBCUTANEOUS | Status: AC
  Administered 2013-06-18: 75 mg via SUBCUTANEOUS
  Filled 2013-06-18: qty 0.8

## 2013-06-18 MED ORDER — ALBUTEROL SULFATE (2.5 MG/3ML) 0.083% IN NEBU: 2.5000 mg | INHALATION_SOLUTION | RESPIRATORY_TRACT | Status: DC

## 2013-06-18 MED ORDER — GUAIFENESIN ER 600 MG PO TB12
600.0000 mg | ORAL_TABLET | Freq: Two times a day (BID) | ORAL | Status: DC
  Administered 2013-06-18 – 2013-06-20 (×5): 600 mg via ORAL
  Filled 2013-06-18 (×6): qty 1

## 2013-06-18 MED ORDER — METOPROLOL SUCCINATE ER 25 MG PO TB24
25.0000 mg | ORAL_TABLET | Freq: Every day | ORAL | Status: DC
  Administered 2013-06-18 – 2013-06-20 (×3): 25 mg via ORAL
  Filled 2013-06-18 (×3): qty 1

## 2013-06-18 MED ORDER — TRAMADOL HCL 50 MG PO TABS: 50.0000 mg | ORAL_TABLET | ORAL | Status: DC | PRN

## 2013-06-18 MED ORDER — ALBUTEROL SULFATE (2.5 MG/3ML) 0.083% IN NEBU: 2.5000 mg | INHALATION_SOLUTION | Freq: Four times a day (QID) | RESPIRATORY_TRACT | Status: DC | PRN

## 2013-06-18 MED ORDER — PYRIDOSTIGMINE BROMIDE 60 MG PO TABS
60.0000 mg | ORAL_TABLET | Freq: Three times a day (TID) | ORAL | Status: DC
  Administered 2013-06-18 – 2013-06-20 (×7): 60 mg via ORAL
  Filled 2013-06-18 (×9): qty 1

## 2013-06-18 MED ORDER — ACETAMINOPHEN 325 MG PO TABS: 650.0000 mg | ORAL_TABLET | Freq: Four times a day (QID) | ORAL | Status: DC | PRN

## 2013-06-18 MED ORDER — IPRATROPIUM BROMIDE 0.02 % IN SOLN: 0.5000 mg | RESPIRATORY_TRACT | Status: DC

## 2013-06-18 MED ORDER — ENOXAPARIN SODIUM 80 MG/0.8ML ~~LOC~~ SOLN
75.0000 mg | Freq: Two times a day (BID) | SUBCUTANEOUS | Status: DC
  Administered 2013-06-18: 75 mg via SUBCUTANEOUS
  Filled 2013-06-18 (×2): qty 0.8

## 2013-06-18 MED ORDER — ACETAMINOPHEN 650 MG RE SUPP: 650.0000 mg | Freq: Four times a day (QID) | RECTAL | Status: DC | PRN

## 2013-06-18 MED ORDER — WARFARIN - PHARMACIST DOSING INPATIENT
Freq: Every day | Status: DC
  Administered 2013-06-19: 18:00:00

## 2013-06-18 MED ORDER — ONDANSETRON HCL 4 MG PO TABS: 4.0000 mg | ORAL_TABLET | Freq: Four times a day (QID) | ORAL | Status: DC | PRN

## 2013-06-18 NOTE — Progress Notes (Signed)
Patient admitted earlier today for acute respiratory distress. Much improved today. Still wheezing. Continue nebs, steroids. Given negative CXR and multiple recent courses of abx, feel there is no need for abx at present. Will continue to follow. Hopeful for DC over next 1-2 days.  Domingo Mend, MD [facility] Pager: [phone]

## 2013-06-18 NOTE — H&P (Signed)
[facility] History and Physical  Patient: [name]  FUX:[id number]  DOB: [birth year]  DOS: the patient was seen and examined on 06/18/2013 PCP: Renato Shin, MD  Chief Complaint: Cough and shortness of breath  HPI: [name] is a 73 y.o. 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 The patient presented with complaints of cough and shortness of breath. He mentions he had initially cough and shortness of breath in March 2015 admission he was treated with antibiotics for 7 days, his symptoms of cough were not improving and therefore he was given a dose 10 days of antibiotics by his PCP. Again in the end of March his symptoms were not improving and therefore his PCP ordered a CT scan of his chest and since the CT scan was showing some scarring in his symptoms were not improving he was sent to pulmonology for further workup. Pulmonology stopped his lisinopril and asked him to continue PPI and H2 blocker and followup in 6 weeks. He also was found to have epigastric pain with food for which she has seen gastroenterology who started him on nystatin and thought he could have Candida esophagitis. He denies any prior history of asthma or COPD, he has a cat at home, no exposure to chemicals in the past no smoking history. Initially his sputum was yellow now it is clear white. Today throughout the day he started having increasingly frequent and harder cough, and later in the night he started having shortness of breath at rest. Earlier in January he was able to walk 1 mile Without any shortness of breath, and since last to 3 weeks he gets out of breath even walking around the house. He denies any orthopnea or PND, he has nocturnal cough, he also complains of some night sweats, he has lost 10 pounds over last 1 week due to poor appetite. Occasionally has nausea but no vomiting. Denies any rash  or photophobia.  The patient is coming from home. And at his baseline independent for most of his ADL.  Review of Systems: as mentioned in the history of present illness.  A Comprehensive review of the other systems is negative.  Past Medical History  Diagnosis Date  . Coronary atherosclerosis of unspecified type of vessel, native or graft     remote cath in 2003 showing distal and branch vessel CAD with normal LV function; managed medically  . Mixed hyperlipidemia   . Glucose intolerance (impaired glucose tolerance)     history of  . Diverticulosis of colon (without mention of hemorrhage)   . Hx of colonic polyps   . Internal hemorrhoids without mention of complication   . Rash and other nonspecific skin eruption   . Pneumonia, organism unspecified   . Conjunctivitis unspecified   . Myasthenia gravis     uses prednisone  . GERD (gastroesophageal reflux disease)   . Pulmonary embolism June 2013  . DVT (deep venous thrombosis) June 2013  . Chronic anticoagulation   . Diabetes mellitus   . DJD (degenerative joint disease)   . Ptosis   . CML (chronic myeloid leukemia)   . HTN (hypertension)   . IBS (irritable bowel syndrome)   . History of pneumonia   . Dystonia   . High cholesterol   . Heart disease   . Cancer   . 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 PONV (postoperative nausea and vomiting)   .  External hemorrhoids without mention of complication     blood clot left 6/13 with pe  . History of kidney stones    Past Surgical History  Procedure Laterality Date  . Angioplasty  2001  . Back surgery      x2, lumbar and cervical  . Tonsillectomy    . Cataract extraction Bilateral 12/12  . Shoulder surgery Left 05/07/2009  . Cardiac catheterization    . Lumbar laminectomy/decompression microdiscectomy Right 09/14/2012    Procedure: Right Lumbar three-four, four-five, Lumbar five-Sacral one decompressive laminectomy;  Surgeon: [name], MD;  Location: [facility];  Service: Neurosurgery;  Laterality: Right;   Social History:  reports that he has never smoked. He has never used smokeless tobacco. He reports that he does not drink alcohol or use illicit drugs.  Allergies  Allergen Reactions  . Oxycodone-Acetaminophen Other (See Comments)    "too strong"  . Sulfamethoxazole Rash and Itching    Family History  Problem Relation Age of Onset  . Heart disease Mother   . Heart attack Mother   . Stroke Mother   . Heart disease Father   . Heart attack Father   . Stroke Father   . Breast cancer Sister     Twin   . Heart attack Sister   . Dementia Sister   . Heart disease Sister   . Heart attack Sister   . Clotting disorder Sister   . Heart attack Sister     Prior to Admission medications   Medication Sig Start Date End Date Taking? Authorizing Provider  acetaminophen (TYLENOL) 500 MG tablet Take 500 mg by mouth every 6 (six) hours as needed for fever.   Yes Historical Provider, MD  benzonatate (TESSALON) 100 MG capsule Take 100 mg by mouth 3 (three) times daily as needed for cough.   Yes Historical Provider, MD  cefUROXime (CEFTIN) 500 MG tablet Take 500 mg by mouth 2 (two) times daily with a meal.   Yes Historical Provider, MD  esomeprazole (NEXIUM) 40 MG capsule Take 40 mg by mouth daily.   Yes Historical Provider, MD  imatinib (GLEEVEC) 400 MG tablet Take 400 mg by mouth daily.    Yes Historical Provider, MD  isosorbide mononitrate (IMDUR) 30 MG 24 hr tablet Take 1 tablet (30 mg total) by mouth daily. 11/15/12  Yes Sherren Mocha, MD  loperamide (IMODIUM) 2 MG capsule Take 2 mg by mouth every morning. For diarrhea   Yes Historical Provider, MD  LORazepam (ATIVAN) 1 MG tablet Take 1 tablet (1 mg total) by mouth at bedtime. As needed 11/21/12  Yes Ladell Pier, MD  metFORMIN (GLUCOPHAGE-XR) 500 MG 24 hr tablet TAKE 1 TABLET BY MOUTH EVERY DAY IN THE MORNING 06/05/13  Yes Renato Shin, MD  metoprolol succinate (TOPROL-XL) 25 MG 24 hr tablet  Take 1 tablet (25 mg total) by mouth daily. 11/15/12  Yes Sherren Mocha, MD  Multiple Vitamin (MULTIVITAMIN) tablet Take 1 tablet by mouth daily.   Yes Historical Provider, MD  Multiple Vitamins-Minerals (OCUVITE PO) Take 1 tablet by mouth daily.   Yes Historical Provider, MD  ondansetron (ZOFRAN) 4 MG tablet Take 1 tablet (4 mg total) by mouth every 12 (twelve) hours as needed for nausea. 11/21/12  Yes Ladell Pier, MD  predniSONE (DELTASONE) 5 MG tablet Take 5 mg by mouth daily.  12/13/12  Yes Kathrynn Ducking, MD  pyridostigmine (MESTINON) 60 MG tablet Take 1 tablet (60 mg total) by mouth 3 (three)  times daily. 11/24/12  Yes Kathrynn Ducking, MD  rosuvastatin (CRESTOR) 10 MG tablet Take 5 mg by mouth every morning.  10/04/12  Yes Sherren Mocha, MD  traMADol (ULTRAM) 50 MG tablet Take 1 tablet (50 mg total) by mouth every 4 (four) hours as needed for pain. For pain 09/16/12  Yes [name], MD  valsartan (DIOVAN) 80 MG tablet Take 1 tablet (80 mg total) by mouth daily. 06/12/13  Yes Tanda Rockers, MD  warfarin (COUMADIN) 5 MG tablet Take 5 mg by mouth See admin instructions. 36m on Mondays all other days take 2.555m  Yes Historical Provider, MD    Physical Exam: Filed Vitals:   06/17/13 2300 06/18/13 0241 06/18/13 0311  BP: 126/76 132/65   Pulse: 86 83   Temp: 98.4 F (36.9 C) 97.9 F (36.6 C)   TempSrc: Oral Oral   Resp: 21    Height: 5' 7"  (1.702 m)    Weight: 77.565 kg (171 lb)    SpO2: 96% 96% 96%    General: Alert, Awake and Oriented to Time, Place and Person. Appear in mild distress Eyes: PERRL ENT: Oral Mucosa clear moist. Neck: No JVD Cardiovascular: S1 and S2 Present, aortic systolic Murmur, Peripheral Pulses Present Respiratory: Bilateral Air entry equal and Decreased, diffuse occasional Crackles, extensive expiratory wheezes Abdomen: Bowel Sound Present, Soft and Non tender Skin: No Rash Extremities: Bilateral chronic Pedal edema, no calf tenderness Neurologic:  Grossly no focal neuro deficit.  Labs on Admission:  CBC:  Recent Labs Lab 06/18/13 0018  WBC 6.9  NEUTROABS 4.2  HGB 12.7*  HCT 38.0*  MCV 95.0  PLT 199    CMP     Component Value Date/Time   NA 139 06/18/2013 0018   NA 141 02/26/2012 0852   K 4.3 06/18/2013 0018   K 3.4* 02/26/2012 0852   CL 101 06/18/2013 0018   CL 105 02/26/2012 0852   CO2 27 06/18/2013 0018   CO2 28 02/26/2012 0852   GLUCOSE 112* 06/18/2013 0018   GLUCOSE 77 02/26/2012 0852   BUN 14 06/18/2013 0018   BUN 13.0 02/26/2012 0852   CREATININE 1.60* 06/18/2013 0018   CREATININE 1.33 05/11/2013 1246   CREATININE 1.3 02/26/2012 0852   CALCIUM 9.0 06/18/2013 0018   CALCIUM 9.1 02/26/2012 0852   CALCIUM 8.9 08/04/2010 0836   PROT 5.6* 06/01/2013 1146   PROT 6.1* 02/26/2012 0852   ALBUMIN 3.7 06/01/2013 1146   ALBUMIN 3.6 02/26/2012 0852   AST 41* 06/01/2013 1146   AST 26 02/26/2012 0852   ALT 43 06/01/2013 1146   ALT 37 02/26/2012 0852   ALKPHOS 43 06/01/2013 1146   ALKPHOS 53 02/26/2012 0852   BILITOT 0.9 06/01/2013 1146   BILITOT 0.75 02/26/2012 0852   GFRNONAA 41* 06/18/2013 0018   GFRAA 47* 06/18/2013 0018    No results found for this basename: LIPASE, AMYLASE,  in the last 168 hours No results found for this basename: AMMONIA,  in the last 168 hours   Recent Labs Lab 06/18/13 0018  TROPONINI <0.30   BNP (last 3 results)  Recent Labs  06/18/13 0018  PROBNP 59.6    Radiological Exams on Admission: Dg Chest 2 View  06/18/2013   CLINICAL DATA:  Short of breath.  Cough.  Congestion.  EXAM: CHEST  2 VIEW  COMPARISON:  CT CHEST W/CM dated 05/15/2013; DG CHEST 2 VIEW dated 05/11/2013; DG CHEST 2V dated 05/03/2013  FINDINGS: Cardiopericardial silhouette is mildly enlarged for  projection. Aortic arch atherosclerosis. Monitoring leads project over the chest. Basilar atelectasis. No effusion. Monitoring leads project over the chest.  IMPRESSION: Stable mild cardiomegaly without failure.  Basilar atelectasis.   Electronically  Signed   By: Dereck Ligas M.D.   On: 06/18/2013 01:02    EKG: Independently reviewed. normal EKG, normal sinus rhythm.  Assessment/Plan Principal Problem:   Bronchitis, chronic with acute exacerbation Active Problems:   LEUKEMIA, MYELOID, CHRONIC   HYPERTENSION   Type II or unspecified type diabetes mellitus without mention of complication, not stated as uncontrolled   Pulmonary embolism   Myasthenia gravis   GERD with stricture   Upper airway cough syndrome   1. Bronchitis, chronic with acute exacerbation The patient is presenting with complaints of with shortness of breath. Chest x-ray is negative for any acute pneumonia. He is not hypoxic. But he does appear in significant respiratory distress and has extensive bilateral expiratory wheezing which was not present based on the documentation from pulmonology 4 days ago. With this the patient is admitted to the hospital for further workup with suspicion of acute on chronic bronchitis. I would hold off on antibiotics since he has undergone extensive courses of antibiotics without any benefit. But I would continue him on high dose of prednisone, DuoNeb, Mucinex, oxygen as needed. I would follow sputum culture urine antigens and influenza PCR as well as respiratory pathogen.  As per chronic cough this presentation could also represent  1 ACE inhibitor allergy, or 2 exacerbation of myasthenia gravis, or 3 esophagitis, or 4 inflammatory interstitial lung disease or any other etiologies. I would check ESR, CRP If his condition worsens he may require ENT consultation, pulmonology consultation for further workup Increasing dose of prednisone would also help with myasthenia gravis  2. CML Continue Gleevec  3. Myasthenia gravis Continue prednisone and pyridostigmine  4. Hypertension Continue Imdur, metoprolol Holding ARB as well  5. History of PE INR subtherapeutic  With his history of CML I would resume with Lovenox and  warfarin  DVT Prophylaxis: subcutaneous Heparin Nutrition: Cardiac and diabetic  Code Status: Full  Family Communication: Family was present at bedside, opportunity was given to ask question and all questions were answered satisfactorily at the time of interview. Disposition: Admitted to inpatient in telemetry unit.  Author: Berle Mull, MD [facility] Pager: [phone] 06/18/2013, 4:37 AM    If 7PM-7AM, please contact night-coverage [email or web address] Password [email or web address]

## 2013-06-18 NOTE — ED Provider Notes (Signed)
CSN: [id number]     Arrival date & time 06/17/13  2251 History   First MD Initiated Contact with Patient 06/17/13 2328     Chief Complaint  Patient presents with  . Cough  . Shortness of Breath     (Consider location/radiation/quality/duration/timing/severity/associated sxs/prior Treatment) HPI Comments: 73 year old male with history of CML in remission, lipids, high blood pressure, myasthenia gravis on Gleevec coronary artery disease without stenting, pneumonia, type 2 diabetes, bronchitis presents to the ER with gradually worsening and intermittent cough and shortness of breath or one and a half to 2 months. Patient has been on 3 or 4 rounds of antibiotics without improvement. He's currently on a cephalosporin was started at urgent care earlier this week. Patient has had intermittent fevers and chills recently with sweats and nausea. No chest pain. Patient has mild shortness of breath that is different than his PE history for which he takes Coumadin. Patient is on 5 mg of prednisone daily. Mild white productive cough. No sick contacts or recent travel. Patient has had no recent changes in his medications.  The history is provided by the patient and a relative.    Past Medical History  Diagnosis Date  . Coronary atherosclerosis of unspecified type of vessel, native or graft     remote cath in 2003 showing distal and branch vessel CAD with normal LV function; managed medically  . Mixed hyperlipidemia   . Glucose intolerance (impaired glucose tolerance)     history of  . Diverticulosis of colon (without mention of hemorrhage)   . Hx of colonic polyps   . Internal hemorrhoids without mention of complication   . Rash and other nonspecific skin eruption   . Pneumonia, organism unspecified   . Conjunctivitis unspecified   . Myasthenia gravis     uses prednisone  . GERD (gastroesophageal reflux disease)   . Pulmonary embolism June 2013  . DVT (deep venous thrombosis) June 2013  . Chronic  anticoagulation   . Diabetes mellitus   . DJD (degenerative joint disease)   . Ptosis   . CML (chronic myeloid leukemia)   . HTN (hypertension)   . IBS (irritable bowel syndrome)   . History of pneumonia   . Dystonia   . High cholesterol   . Heart disease   . Cancer   . 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 PONV (postoperative nausea and vomiting)   . External hemorrhoids without mention of complication     blood clot left 6/13 with pe  . History of kidney stones    Past Surgical History  Procedure Laterality Date  . Angioplasty  2001  . Back surgery      x2, lumbar and cervical  . Tonsillectomy    . Cataract extraction Bilateral 12/12  . Shoulder surgery Left 05/07/2009  . Cardiac catheterization    . Lumbar laminectomy/decompression microdiscectomy Right 09/14/2012    Procedure: Right Lumbar three-four, four-five, Lumbar five-Sacral one decompressive laminectomy;  Surgeon: [name], MD;  Location: [facility];  Service: Neurosurgery;  Laterality: Right;   Family History  Problem Relation Age of Onset  . Heart disease Mother   . Heart attack Mother   . Stroke Mother   . Heart disease Father   . Heart attack Father   . Stroke Father   . Breast cancer Sister     Twin   . Heart attack Sister   . Dementia Sister   . Heart disease Sister   . Heart  attack Sister   . Clotting disorder Sister   . Heart attack Sister    History  Substance Use Topics  . Smoking status: Never Smoker   . Smokeless tobacco: Never Used  . Alcohol Use: No    Review of Systems  Constitutional: Positive for fatigue. Negative for fever and chills.  HENT: Negative for congestion.   Eyes: Negative for visual disturbance.  Respiratory: Positive for cough and shortness of breath.   Cardiovascular: Negative for chest pain.  Gastrointestinal: Negative for vomiting and abdominal pain.  Genitourinary: Negative for dysuria and flank pain.  Musculoskeletal: Negative for back pain, neck  pain and neck stiffness.  Skin: Negative for rash.  Neurological: Negative for light-headedness and headaches.      Allergies  Oxycodone-acetaminophen and Sulfamethoxazole  Home Medications   Prior to Admission medications   Medication Sig Start Date End Date Taking? Authorizing Provider  acetaminophen (TYLENOL) 500 MG tablet Take 500 mg by mouth every 6 (six) hours as needed for fever.   Yes Historical Provider, MD  benzonatate (TESSALON) 100 MG capsule Take 100 mg by mouth 3 (three) times daily as needed for cough.   Yes Historical Provider, MD  cefUROXime (CEFTIN) 500 MG tablet Take 500 mg by mouth 2 (two) times daily with a meal.   Yes Historical Provider, MD  esomeprazole (NEXIUM) 40 MG capsule Take 40 mg by mouth daily.   Yes Historical Provider, MD  imatinib (GLEEVEC) 400 MG tablet Take 400 mg by mouth daily.    Yes Historical Provider, MD  isosorbide mononitrate (IMDUR) 30 MG 24 hr tablet Take 1 tablet (30 mg total) by mouth daily. 11/15/12  Yes Sherren Mocha, MD  loperamide (IMODIUM) 2 MG capsule Take 2 mg by mouth every morning. For diarrhea   Yes Historical Provider, MD  LORazepam (ATIVAN) 1 MG tablet Take 1 tablet (1 mg total) by mouth at bedtime. As needed 11/21/12  Yes Ladell Pier, MD  metFORMIN (GLUCOPHAGE-XR) 500 MG 24 hr tablet TAKE 1 TABLET BY MOUTH EVERY DAY IN THE MORNING 06/05/13  Yes Renato Shin, MD  metoprolol succinate (TOPROL-XL) 25 MG 24 hr tablet Take 1 tablet (25 mg total) by mouth daily. 11/15/12  Yes Sherren Mocha, MD  Multiple Vitamin (MULTIVITAMIN) tablet Take 1 tablet by mouth daily.   Yes Historical Provider, MD  Multiple Vitamins-Minerals (OCUVITE PO) Take 1 tablet by mouth daily.   Yes Historical Provider, MD  ondansetron (ZOFRAN) 4 MG tablet Take 1 tablet (4 mg total) by mouth every 12 (twelve) hours as needed for nausea. 11/21/12  Yes Ladell Pier, MD  predniSONE (DELTASONE) 5 MG tablet Take 5 mg by mouth daily.  12/13/12  Yes Kathrynn Ducking,  MD  pyridostigmine (MESTINON) 60 MG tablet Take 1 tablet (60 mg total) by mouth 3 (three) times daily. 11/24/12  Yes Kathrynn Ducking, MD  rosuvastatin (CRESTOR) 10 MG tablet Take 5 mg by mouth every morning.  10/04/12  Yes Sherren Mocha, MD  traMADol (ULTRAM) 50 MG tablet Take 1 tablet (50 mg total) by mouth every 4 (four) hours as needed for pain. For pain 09/16/12  Yes [name], MD  valsartan (DIOVAN) 80 MG tablet Take 1 tablet (80 mg total) by mouth daily. 06/12/13  Yes Tanda Rockers, MD  warfarin (COUMADIN) 5 MG tablet Take 5 mg by mouth See admin instructions. 5mg  on Mondays all other days take 2.5mg    Yes Historical Provider, MD   BP 126/76  Pulse 86  Temp(Src) 98.4 F (36.9 C) (Oral)  Resp 21  Ht 5\' 7"  (1.702 m)  Wt 171 lb (77.565 kg)  BMI 26.78 kg/m2  SpO2 96% Physical Exam  Nursing note and vitals reviewed. Constitutional: He is oriented to person, place, and time. He appears well-developed and well-nourished.  HENT:  Head: Normocephalic and atraumatic.  Dry mucous membranes  Eyes: Right eye exhibits no discharge. Left eye exhibits no discharge.  Mild ptosis at baseline for patient  Neck: Normal range of motion. Neck supple. No tracheal deviation present.  Cardiovascular: Normal rate and regular rhythm.   Pulmonary/Chest: Effort normal. No respiratory distress (bilateral expiratory). He has wheezes. He has rales.  Abdominal: Soft. He exhibits no distension. There is no tenderness. There is no guarding.  Musculoskeletal: He exhibits edema (mild lower extremities bilateral similar to previous). He exhibits no tenderness.  Neurological: He is alert and oriented to person, place, and time.  Skin: Skin is warm. No rash noted.  Psychiatric: He has a normal mood and affect.    ED Course  Procedures (including critical care time) Labs Review Labs Reviewed  CBC WITH DIFFERENTIAL  BASIC METABOLIC PANEL  PRO B NATRIURETIC PEPTIDE  TROPONIN I    Imaging Review No results  found.   EKG Interpretation None      MDM   Final diagnoses:  None   Clinical concern for pneumonia/bronchitis. I am concerned this patient is on chronic prednisone and has CML history. He is not improved with outpatient antibiotics for the past month and has worsening symptoms with subjective fever and chills. Plan for blood work, chest x-ray, cardiac workup. Patient is on Coumadin for PE history however clinically this is not consistent with pulmonary embolism and patient agrees this feels more like his pneumonia history. Patient is wheezing expiratory no known lung disease such as COPD breathing treatments in ED. Prednisone bolus for both wheezing and his chronic prednisone use.       Mariea Clonts, MD 06/20/13 [phone]

## 2013-06-18 NOTE — ED Notes (Signed)
MD at bedside. 

## 2013-06-18 NOTE — ED Notes (Signed)
Departure condition charted in error. Wrong patient.

## 2013-06-18 NOTE — Progress Notes (Signed)
ANTICOAGULATION CONSULT NOTE - Initial Consult  Pharmacy Consult for Warfarin & Lovenox Indication: H/O PE  Allergies  Allergen Reactions  . Oxycodone-Acetaminophen Other (See Comments)    "too strong"  . Sulfamethoxazole Rash and Itching    Patient Measurements: Height: 5\' 7"  (170.2 cm) Weight: 171 lb (77.565 kg) IBW/kg (Calculated) : 66.1  Vital Signs: Temp: 97.9 F (36.6 C) (05/10 0241) Temp src: Oral (05/10 0241) BP: 132/65 mmHg (05/10 0241) Pulse Rate: 83 (05/10 0241)  Labs:  Recent Labs  06/18/13 0018 06/18/13 0105  HGB 12.7*  --   HCT 38.0*  --   PLT 199  --   LABPROT  --  18.5*  INR  --  1.59*  CREATININE 1.60*  --   TROPONINI <0.30  --     Estimated Creatinine Clearance: 37.3 ml/min (by C-G formula based on Cr of 1.6).   Medical History: Past Medical History  Diagnosis Date  . Coronary atherosclerosis of unspecified type of vessel, native or graft     remote cath in 2003 showing distal and branch vessel CAD with normal LV function; managed medically  . Mixed hyperlipidemia   . Glucose intolerance (impaired glucose tolerance)     history of  . Diverticulosis of colon (without mention of hemorrhage)   . Hx of colonic polyps   . Internal hemorrhoids without mention of complication   . Rash and other nonspecific skin eruption   . Pneumonia, organism unspecified   . Conjunctivitis unspecified   . Myasthenia gravis     uses prednisone  . GERD (gastroesophageal reflux disease)   . Pulmonary embolism June 2013  . DVT (deep venous thrombosis) June 2013  . Chronic anticoagulation   . Diabetes mellitus   . DJD (degenerative joint disease)   . Ptosis   . CML (chronic myeloid leukemia)   . HTN (hypertension)   . IBS (irritable bowel syndrome)   . History of pneumonia   . Dystonia   . High cholesterol   . Heart disease   . Cancer   . 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 PONV (postoperative nausea and vomiting)   . External hemorrhoids  without mention of complication     blood clot left 6/13 with pe  . History of kidney stones     Medications:  Scheduled:  . atorvastatin  40 mg Oral q1800  . enoxaparin (LOVENOX) injection  75 mg Subcutaneous NOW  . enoxaparin (LOVENOX) injection  75 mg Subcutaneous Q12H  . famotidine  20 mg Oral BID  . guaiFENesin  600 mg Oral BID  . imatinib  400 mg Oral Daily  . insulin aspart  0-5 Units Subcutaneous QHS  . insulin aspart  0-9 Units Subcutaneous TID WC  . ipratropium-albuterol  3 mL Nebulization Q4H  . isosorbide mononitrate  30 mg Oral Daily  . loperamide  2 mg Oral BH-q7a  . LORazepam  1 mg Oral QHS  . metoprolol succinate  25 mg Oral Daily  . pantoprazole  40 mg Oral BID AC  . predniSONE  60 mg Oral Q breakfast  . pyridostigmine  60 mg Oral TID  . sodium chloride  3 mL Intravenous Q12H  . warfarin  4 mg Oral Once  . Warfarin - Pharmacist Dosing Inpatient   Does not apply q1800   Infusions:    Assessment:  73 yr old male with CML (on oral chemo) with a h/o PE.  On warfarin PTA.  Presents with c/o cough and SOB.  Levaquin 750mg  IV x 1 given in ED for bronchitis  PTA pt on warfarin 2.5 mg daily except for 5 mg on Monday.  Last dose taken on 5/9 and INR upon admission = 1.59 (subtherapeutic).  Lovenox per pharmacy for bridge therapy until INR therapeutic ordered as well as warfarin per pharmacy dosing ordered  Goal of Therapy:  INR 2-3 Monitor platelets by anticoagulation protocol: Yes   Plan:   Lovenox 75mg  sq q12h until INR therapeutic  Warfarin 4mg  po x 1 this AM (1.5 x home dose per protocol for subtherapeutic INR)  Follow daily INR and follow CBC while on Lovenox  Everette Rank, PharmD 06/18/2013,5:07 AM

## 2013-06-18 NOTE — Progress Notes (Signed)
PHARMACY - LOVENOX (brief note) Updated weight in computer = 81 kg therefore adjusted Lovenox dose to 80mg  sq q12h until INR therapeutic.  Leone Haven, PharmD 06/18/13 @ 21:19

## 2013-06-19 LAB — CBC
HCT: 33.4 % — ABNORMAL LOW (ref 39.0–52.0)
Hemoglobin: 11.3 g/dL — ABNORMAL LOW (ref 13.0–17.0)
MCH: 31.7 pg (ref 26.0–34.0)
MCHC: 33.8 g/dL (ref 30.0–36.0)
MCV: 93.6 fL (ref 78.0–100.0)
PLATELETS: 204 10*3/uL (ref 150–400)
RBC: 3.57 MIL/uL — AB (ref 4.22–5.81)
RDW: 13.6 % (ref 11.5–15.5)
WBC: 11 10*3/uL — ABNORMAL HIGH (ref 4.0–10.5)

## 2013-06-19 LAB — GLUCOSE, CAPILLARY
GLUCOSE-CAPILLARY: 212 mg/dL — AB (ref 70–99)
Glucose-Capillary: 194 mg/dL — ABNORMAL HIGH (ref 70–99)
Glucose-Capillary: 112 mg/dL — ABNORMAL HIGH (ref 70–99)
Glucose-Capillary: 131 mg/dL — ABNORMAL HIGH (ref 70–99)
Glucose-Capillary: 218 mg/dL — ABNORMAL HIGH (ref 70–99)

## 2013-06-19 LAB — RESPIRATORY VIRUS PANEL
Adenovirus: NOT DETECTED
INFLUENZA B 1: NOT DETECTED
Influenza A H1: NOT DETECTED
Influenza A H3: NOT DETECTED
Influenza A: NOT DETECTED
Metapneumovirus: NOT DETECTED
PARAINFLUENZA 1 A: NOT DETECTED
PARAINFLUENZA 3 A: DETECTED — AB
Parainfluenza 2: NOT DETECTED
Respiratory Syncytial Virus A: NOT DETECTED
Respiratory Syncytial Virus B: NOT DETECTED
Rhinovirus: NOT DETECTED

## 2013-06-19 LAB — PROTIME-INR
INR: 2.54 — ABNORMAL HIGH (ref 0.00–1.49)
PROTHROMBIN TIME: 26.5 s — AB (ref 11.6–15.2)

## 2013-06-19 LAB — BASIC METABOLIC PANEL
BUN: 23 mg/dL (ref 6–23)
CALCIUM: 8.4 mg/dL (ref 8.4–10.5)
CO2: 22 meq/L (ref 19–32)
CREATININE: 1.46 mg/dL — AB (ref 0.50–1.35)
Chloride: 102 mEq/L (ref 96–112)
GFR calc Af Amer: 52 mL/min — ABNORMAL LOW (ref >90)
GFR, EST NON AFRICAN AMERICAN: 45 mL/min — AB (ref >90)
Glucose, Bld: 139 mg/dL — ABNORMAL HIGH (ref 70–99)
Potassium: 4.1 mEq/L (ref 3.7–5.3)
Sodium: 138 mEq/L (ref 137–147)

## 2013-06-19 LAB — LEGIONELLA ANTIGEN, URINE: Legionella Antigen, Urine: NEGATIVE

## 2013-06-19 MED ORDER — HYDROCOD POLST-CHLORPHEN POLST 10-8 MG/5ML PO LQCR
5.0000 mL | Freq: Two times a day (BID) | ORAL | Status: DC | PRN
  Administered 2013-06-19 (×2): 5 mL via ORAL
  Filled 2013-06-19 (×2): qty 5

## 2013-06-19 MED ORDER — WARFARIN SODIUM 2.5 MG PO TABS
2.5000 mg | ORAL_TABLET | Freq: Once | ORAL | Status: AC
  Administered 2013-06-19: 2.5 mg via ORAL
  Filled 2013-06-19: qty 1

## 2013-06-19 NOTE — Evaluation (Signed)
Clinical/Bedside Swallow Evaluation Patient Details  Name: [name] MRN: [id number] Date of Birth: 1940-07-01  Today's Date: 06/19/2013 Time: 7867-6720 SLP Time Calculation (min): 30 min  Past Medical History:  Past Medical History  Diagnosis Date  . Coronary atherosclerosis of unspecified type of vessel, native or graft     remote cath in 2003 showing distal and branch vessel CAD with normal LV function; managed medically  . Mixed hyperlipidemia   . Glucose intolerance (impaired glucose tolerance)     history of  . Diverticulosis of colon (without mention of hemorrhage)   . Hx of colonic polyps   . Internal hemorrhoids without mention of complication   . Rash and other nonspecific skin eruption   . Pneumonia, organism unspecified   . Conjunctivitis unspecified   . Myasthenia gravis     uses prednisone  . GERD (gastroesophageal reflux disease)   . Pulmonary embolism June 2013  . DVT (deep venous thrombosis) June 2013  . Chronic anticoagulation   . Diabetes mellitus   . DJD (degenerative joint disease)   . Ptosis   . CML (chronic myeloid leukemia)   . HTN (hypertension)   . IBS (irritable bowel syndrome)   . History of pneumonia   . Dystonia   . High cholesterol   . Heart disease   . Cancer   . 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 PONV (postoperative nausea and vomiting)   . External hemorrhoids without mention of complication     blood clot left 6/13 with pe  . History of kidney stones    Past Surgical History:  Past Surgical History  Procedure Laterality Date  . Angioplasty  2001  . Back surgery      x2, lumbar and cervical  . Tonsillectomy    . Cataract extraction Bilateral 12/12  . Shoulder surgery Left 05/07/2009  . Cardiac catheterization    . Lumbar laminectomy/decompression microdiscectomy Right 09/14/2012    Procedure: Right Lumbar three-four, four-five, Lumbar five-Sacral one decompressive laminectomy;  Surgeon: [name], MD;  Location:  [facility];  Service: Neurosurgery;  Laterality: Right;   HPI:  73 yo male adm to [facility] with respiratory issues - He has h/o Myasthenia Gravis, epigastric pain with po intake, esophageal dysmotlity and strictures s/p multiple dilatations, CAD, CML, dystonia, CA, DM.  Swallow evaluation was ordered - presumed due to pt's neurological hx.  Pt admits to occasional difficulties swallowing and was recently treated for candida esophagitis but denies improvement with medication. Pt does state his swallow ability is much improved now.     Assessment / Plan / Recommendation Clinical Impression  Pt presents with negative CN exam, although with Myasthenia gravis fatigue may be factor for aspiration risk. Observed pt consuming water, icecream and cracker- eating at slow rate with adequate mastication/oral transiting.  Cough x2 noted with icecream and cracker only - no indications of swallow issues with water - pt stated it was just "time for me to cough" denying association of cough with intake.  He did become mildly dyspenic after coughing episodes and SLP advised pt to take rest break at that time.    Educated pt to aspiration precautions using teach back for reinforcement.  Toward end of evaluation pt did admit to becoming "choked" on mixture of food/drink at times.  Advised him to monitor his swallow ability tonight to provide furhter details tomorrow.    Will follow up to assure pt tolerating po diet and compensation strategies provided are helpful  to mitigate aspiration risk with pt's multifactorial dysphagia.  Given pt's known issues with GERD, esophageal dysmotlity and strictures, etc recommend strict esophageal precautions.      Aspiration Risk  Mild    Diet Recommendation Regular;Thin liquid (advised pt eat several small meals)   Liquid Administration via: Cup Medication Administration: Whole meds with liquid (as tolerated, consider with pudding if needed) Compensations: Slow rate;Small  sips/bites Postural Changes and/or Swallow Maneuvers: Seated upright 90 degrees;Upright 30-60 min after meal    Other  Recommendations   n/a  Follow Up Recommendations  None    Frequency and Duration min 1 x/week  1 week   Pertinent Vitals/Pain Afebrile, decreased- occasional crackles, wheeze     Swallow Study Prior Functional Status   see [place] Date of Onset: 06/19/13 HPI: 73 yo male adm to [facility] with respiratory issues - He has h/o Myasthenia Gravis, epigastric pain with po intake, esophageal dysmotlity and strictures s/p multiple dilatations, CAD, CML, dystonia, CA, DM.  Swallow evaluation was ordered - presumed due to pt's neurological hx.  Pt admits to occasional difficulties swallowing and was recently treated for candida esophagitis but denies improvement with medication. Pt does state his swallow ability is much improved now.   Type of Study: Bedside swallow evaluation Diet Prior to this Study: Regular;Thin liquids Temperature Spikes Noted: No Respiratory Status: Room air History of Recent Intubation: No Behavior/Cognition: Alert;Cooperative;Pleasant mood Oral Cavity - Dentition: Adequate natural dentition Self-Feeding Abilities: Able to feed self Patient Positioning: Upright in bed Baseline Vocal Quality: Clear Volitional Cough: Strong Volitional Swallow: Able to elicit    Oral/Motor/Sensory Function Overall Oral Motor/Sensory Function: Appears within functional limits for tasks assessed   Ice Chips Ice chips: Not tested   Thin Liquid Thin Liquid: Within functional limits Presentation: Cup;Self Fed    Nectar Thick Nectar Thick Liquid: Not tested   Honey Thick Honey Thick Liquid: Not tested   Puree Puree: Within functional limits Presentation: Self Fed;Spoon Other Comments: cough x1 during intake of 2 ounces ice cream - pt denies related to intake    Solid   GO    Solid: Within functional limits Presentation: Self Fed Other Comments: cough x1 noted with  2 saltine crackers, pt denies due to intake stating it was "time for me to cough anyway"        Luanna Salk, [facility] [place] [phone]

## 2013-06-19 NOTE — Progress Notes (Signed)
[facility] PROGRESS NOTE  [name] AST:[id number] DOB: [birth year] DOA: 06/17/2013 PCP: Renato Shin, MD  Assessment/Plan: Chronic Bronchitis with Acute Exacerbation/Chronic Cough -No evidence for acute infection on CXR or clinically. -His main complaint remains a cough. -Pulmonary took him off the ACE-I 1 week ago and started a PPI for ?GERD and still no improvement in cough. -Have started tussionex, and he already notes an improvement. -Might benefit from an albuterol INH on DC. -Would follow up with pulmonary sooner than planned. -Continue steroid titration.  CML -Continue Gleevec and OP follow up with oncology.  Myasthenia Gravis  -continue prednisone and pyridostigmine.  H/o PE -Continue coumadin. -DC lovenox as INR therapeutic.  Code Status: Full Code Family Communication: Wife at bedside updated on status and plan of care.  Disposition Plan: Home when ready; likely in 24 hours.   Consultants:  None   Antibiotics:  None   Subjective: C/o cough that is improved after tussionex  Objective: Filed Vitals:   06/18/13 2136 06/19/13 0509 06/19/13 0852 06/19/13 1450  BP: 127/52 128/72  126/65  Pulse: 79 87  76  Temp: 98 F (36.7 C) 97.8 F (36.6 C)  97.6 F (36.4 C)  TempSrc: Oral Oral  Oral  Resp: 18 18  16   Height:      Weight:  81.874 kg (180 lb 8 oz)    SpO2: 97% 97% 96% 98%    Intake/Output Summary (Last 24 hours) at 06/19/13 1716 Last data filed at 06/19/13 1500  Gross per 24 hour  Intake    720 ml  Output    600 ml  Net    120 ml   Filed Weights   06/17/13 2300 06/18/13 0650 06/19/13 0509  Weight: 77.565 kg (171 lb) 81.33 kg (179 lb 4.8 oz) 81.874 kg (180 lb 8 oz)    Exam:   General:  AA Ox3  Cardiovascular: RRR  Respiratory: CTA B  Abdomen: S/NT/ND/+BS  Extremities: no C/C/E   Neurologic:  Non-focal  Data Reviewed: Basic Metabolic Panel:  Recent Labs Lab 06/18/13 0018 06/18/13 0553 06/19/13 0350  NA 139  140 138  K 4.3 4.1 4.1  CL 101 102 102  CO2 27 23 22   GLUCOSE 112* 145* 139*  BUN 14 13 23   CREATININE 1.60* 1.45* 1.46*  CALCIUM 9.0 8.3* 8.4   Liver Function Tests:  Recent Labs Lab 06/18/13 0553  AST 26  ALT 23  ALKPHOS 48  BILITOT 0.4  PROT 5.8*  ALBUMIN 3.2*   No results found for this basename: LIPASE, AMYLASE,  in the last 168 hours No results found for this basename: AMMONIA,  in the last 168 hours CBC:  Recent Labs Lab 06/18/13 0018 06/18/13 0553 06/19/13 0350  WBC 6.9 6.0 11.0*  NEUTROABS 4.2  --   --   HGB 12.7* 12.0* 11.3*  HCT 38.0* 35.7* 33.4*  MCV 95.0 95.2 93.6  PLT 199 166 204   Cardiac Enzymes:  Recent Labs Lab 06/18/13 0018  TROPONINI <0.30   BNP (last 3 results)  Recent Labs  06/18/13 0018  PROBNP 59.6   CBG:  Recent Labs Lab 06/18/13 1211 06/18/13 1729 06/18/13 2134 06/19/13 0741 06/19/13 1131  GLUCAP 152* 294* 194* 112* 131*    Recent Results (from the past 240 hour(s))  CULTURE, EXPECTORATED SPUTUM-ASSESSMENT     Status: None   Collection Time    06/18/13  4:46 PM      Result Value Ref Range Status   Specimen  Description SPUTUM   Final   Special Requests NONE   Final   Sputum evaluation     Final   Value: MICROSCOPIC FINDINGS SUGGEST THAT THIS SPECIMEN IS NOT REPRESENTATIVE OF LOWER RESPIRATORY SECRETIONS. PLEASE RECOLLECT.     NOTIFIED ROTH,B AT 3329 ON [id number] BY HOOKER,B   Report Status 06/18/2013 FINAL   Final     Studies: Dg Chest 2 View  06/18/2013   CLINICAL DATA:  Short of breath.  Cough.  Congestion.  EXAM: CHEST  2 VIEW  COMPARISON:  CT CHEST W/CM dated 05/15/2013; DG CHEST 2 VIEW dated 05/11/2013; DG CHEST 2V dated 05/03/2013  FINDINGS: Cardiopericardial silhouette is mildly enlarged for projection. Aortic arch atherosclerosis. Monitoring leads project over the chest. Basilar atelectasis. No effusion. Monitoring leads project over the chest.  IMPRESSION: Stable mild cardiomegaly without failure.  Basilar  atelectasis.   Electronically Signed   By: Dereck Ligas M.D.   On: 06/18/2013 01:02    Scheduled Meds: . atorvastatin  40 mg Oral q1800  . famotidine  20 mg Oral BID  . guaiFENesin  600 mg Oral BID  . imatinib  400 mg Oral Daily  . insulin aspart  0-5 Units Subcutaneous QHS  . insulin aspart  0-9 Units Subcutaneous TID WC  . ipratropium-albuterol  3 mL Nebulization TID  . isosorbide mononitrate  30 mg Oral Daily  . loperamide  2 mg Oral BH-q7a  . LORazepam  1 mg Oral QHS  . metoprolol succinate  25 mg Oral Daily  . pantoprazole  40 mg Oral BID AC  . predniSONE  60 mg Oral Q breakfast  . pyridostigmine  60 mg Oral TID  . sodium chloride  3 mL Intravenous Q12H  . warfarin  2.5 mg Oral ONCE-1800  . Warfarin - Pharmacist Dosing Inpatient   Does not apply q1800   Continuous Infusions:   Principal Problem:   Bronchitis, chronic with acute exacerbation Active Problems:   LEUKEMIA, MYELOID, CHRONIC   HYPERTENSION   Type II or unspecified type diabetes mellitus without mention of complication, not stated as uncontrolled   Pulmonary embolism   Myasthenia gravis   GERD with stricture   Upper airway cough syndrome    Time spent: 35 minutes. Greater than 50% of this time was spent in direct contact with the patient coordinating care.    [facility] Pager [phone]  If 7PM-7AM, please contact night-coverage at [email or web address], password [facility] 06/19/2013, 5:16 PM  LOS: 2 days

## 2013-06-19 NOTE — Progress Notes (Signed)
ANTICOAGULATION CONSULT NOTE - Follow-up Consult  Pharmacy Consult for Warfarin & Lovenox Indication: H/O PE  Allergies  Allergen Reactions  . Oxycodone-Acetaminophen Other (See Comments)    "too strong"  . Sulfamethoxazole Rash and Itching    Patient Measurements: Height: 5\' 7"  (170.2 cm) Weight: 180 lb 8 oz (81.874 kg) IBW/kg (Calculated) : 66.1  Vital Signs: Temp: 97.8 F (36.6 C) (05/11 0509) Temp src: Oral (05/11 0509) BP: 128/72 mmHg (05/11 0509) Pulse Rate: 87 (05/11 0509)  Labs:  Recent Labs  06/18/13 0018 06/18/13 0105 06/18/13 0553 06/19/13 0350  HGB 12.7*  --  12.0* 11.3*  HCT 38.0*  --  35.7* 33.4*  PLT 199  --  166 204  LABPROT  --  18.5* 19.9* 26.5*  INR  --  1.59* 1.75* 2.54*  CREATININE 1.60*  --  1.45* 1.46*  TROPONINI <0.30  --   --   --     Estimated Creatinine Clearance: 44.8 ml/min (by C-G formula based on Cr of 1.46).   Medical History: Past Medical History  Diagnosis Date  . Coronary atherosclerosis of unspecified type of vessel, native or graft     remote cath in 2003 showing distal and branch vessel CAD with normal LV function; managed medically  . Mixed hyperlipidemia   . Glucose intolerance (impaired glucose tolerance)     history of  . Diverticulosis of colon (without mention of hemorrhage)   . Hx of colonic polyps   . Internal hemorrhoids without mention of complication   . Rash and other nonspecific skin eruption   . Pneumonia, organism unspecified   . Conjunctivitis unspecified   . Myasthenia gravis     uses prednisone  . GERD (gastroesophageal reflux disease)   . Pulmonary embolism June 2013  . DVT (deep venous thrombosis) June 2013  . Chronic anticoagulation   . Diabetes mellitus   . DJD (degenerative joint disease)   . Ptosis   . CML (chronic myeloid leukemia)   . HTN (hypertension)   . IBS (irritable bowel syndrome)   . History of pneumonia   . Dystonia   . High cholesterol   . Heart disease   . Cancer   .  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 PONV (postoperative nausea and vomiting)   . External hemorrhoids without mention of complication     blood clot left 6/13 with pe  . History of kidney stones     Medications:  Scheduled:  . atorvastatin  40 mg Oral q1800  . famotidine  20 mg Oral BID  . guaiFENesin  600 mg Oral BID  . imatinib  400 mg Oral Daily  . insulin aspart  0-5 Units Subcutaneous QHS  . insulin aspart  0-9 Units Subcutaneous TID WC  . ipratropium-albuterol  3 mL Nebulization TID  . isosorbide mononitrate  30 mg Oral Daily  . loperamide  2 mg Oral BH-q7a  . LORazepam  1 mg Oral QHS  . metoprolol succinate  25 mg Oral Daily  . pantoprazole  40 mg Oral BID AC  . predniSONE  60 mg Oral Q breakfast  . pyridostigmine  60 mg Oral TID  . sodium chloride  3 mL Intravenous Q12H  . Warfarin - Pharmacist Dosing Inpatient   Does not apply q1800   Infusions:    Assessment: 73 yr old male with CML (on oral chemo) with a h/o PE on chronic warfarin PTA.  Presents with c/o cough and SOB.  Levaquin 750mg  IV x  1 given in ED for bronchitis  Home dose warfarin: 2.5 mg daily except for 5 mg on Monday.    Last dose taken on 5/9 and INR upon admission = 1.59 (subtherapeutic).  Lovenox per pharmacy for bridge therapy until INR therapeutic ordered as well as warfarin per pharmacy dosing ordered  INR today = 2.54, now therapeutic  CBC: Slight decrease in hgb, plts ok.    Inpatient warfarin doses 5/10: 4mg   Goal of Therapy:  INR 2-3 Monitor platelets by anticoagulation protocol: Yes   Plan:   D/C lovenox as INR now >2  Warfarin 2.5mg  po x 1 this AM  F/u PT/INR, CBC  Ralene Bathe, PharmD, BCPS 06/19/2013, 12:17 PM  Pager: [phone]

## 2013-06-19 NOTE — Clinical Documentation Improvement (Signed)
Presents with cough and shortness of breath. Many differential diagnoses are documented.  After study, please clarify the likely etiology of the patient's cough and shortness of breath:   Chronic Obstructive Bronchitis with Acute Exacerbation  Myasthenia Gravis Exacerbation  Interstitial Lung Disease  Esophagitis  Ace Inhibitor Allergy    Thank You, Zoila Shutter ,RN Clinical Documentation Specialist:  [facility]

## 2013-06-20 ENCOUNTER — Encounter: Payer: Self-pay | Admitting: Neurology

## 2013-06-20 LAB — GLUCOSE, CAPILLARY: GLUCOSE-CAPILLARY: 102 mg/dL — AB (ref 70–99)

## 2013-06-20 LAB — PROTIME-INR
INR: 2.61 — ABNORMAL HIGH (ref 0.00–1.49)
PROTHROMBIN TIME: 27 s — AB (ref 11.6–15.2)

## 2013-06-20 MED ORDER — WARFARIN SODIUM 2.5 MG PO TABS
2.5000 mg | ORAL_TABLET | Freq: Once | ORAL | Status: DC
  Filled 2013-06-20: qty 1

## 2013-06-20 MED ORDER — HYDROCOD POLST-CHLORPHEN POLST 10-8 MG/5ML PO LQCR: 5.0000 mL | Freq: Two times a day (BID) | ORAL | Status: DC | PRN

## 2013-06-20 MED ORDER — PREDNISONE (PAK) 10 MG PO TABS: ORAL_TABLET | Freq: Every day | ORAL | Status: DC

## 2013-06-20 MED ORDER — GUAIFENESIN ER 600 MG PO TB12
600.0000 mg | ORAL_TABLET | Freq: Two times a day (BID) | ORAL | Status: DC
Start: 2013-06-20 — End: 2013-08-28

## 2013-06-20 MED ORDER — ALBUTEROL SULFATE HFA 108 (90 BASE) MCG/ACT IN AERS: 2.0000 | INHALATION_SPRAY | Freq: Four times a day (QID) | RESPIRATORY_TRACT | Status: DC | PRN

## 2013-06-20 NOTE — Progress Notes (Signed)
Speech Language Pathology Treatment: Dysphagia  Patient Details Name: [name] MRN: [id number] DOB: [birth year] Today's Date: 06/20/2013 Time: [id number] SLP Time Calculation (min): 25 min  Assessment / Plan / Recommendation Clinical Impression  Pt seen to assess tolerance of po diet after clinical swallow evaluation completed yesterday.  Pt reports good intake of dinner last night and denies issues without swallowing/sensation of choking.  Cough continues but pt reports is improved.  Xerostomia, known issues with possible gastroparesis, esophageal dysmotility, esophageal strictures s/p dilatations, and possible pharyngeal dysphagia associated with myasthenia gravis compensations provided verbally and in writing.  Teach back utilized for confirmation of understanding.  Also provided heimlich maneuver in writing for use prn.    No further SLP indicated at this time, as all education completed.  Pt and family expressed gratitude for information.  Thanks for allowing to me to assist with this pt's care.    HPI HPI: 73 yo male adm to [facility] with respiratory issues - He has h/o Myasthenia Gravis, epigastric pain with po intake, esophageal dysmotlity and strictures s/p multiple dilatations, CAD, CML, dystonia, CA, DM.  Swallow evaluation was ordered - presumed due to pt's neurological hx.  Pt admits to occasional difficulties swallowing and was recently treated for candida esophagitis but denies improvement with medication.BSE completed yesterday and followup today indicated due to neurological hx.     Pertinent Vitals Afebrile, decreased  SLP Plan  All goals met    Recommendations Diet recommendations: Regular;Thin liquid Medication Administration: Whole meds with liquid (as tolerated, consider with pudding if problematic) Supervision: Patient able to self feed Compensations: Slow rate;Small sips/bites (consume liquids t/o meal) Postural Changes and/or Swallow Maneuvers: Seated upright 90  degrees;Upright 30-60 min after meal              Follow up Recommendations: None Plan: All goals met    [place], [place] [facility] SLP [phone]

## 2013-06-20 NOTE — Progress Notes (Signed)
ANTICOAGULATION CONSULT NOTE - Follow-up Consult  Pharmacy Consult for Warfarin Indication: H/O PE  Allergies  Allergen Reactions  . Oxycodone-Acetaminophen Other (See Comments)    "too strong"  . Sulfamethoxazole Rash and Itching    Patient Measurements: Height: 5\' 7"  (170.2 cm) Weight: 181 lb 7 oz (82.3 kg) IBW/kg (Calculated) : 66.1  Vital Signs: Temp: 98.1 F (36.7 C) (05/12 0537) Temp src: Oral (05/12 0537) BP: 140/62 mmHg (05/12 1012) Pulse Rate: 70 (05/12 1012)  Labs:  Recent Labs  06/18/13 0018  06/18/13 0553 06/19/13 0350 06/20/13 0346  HGB 12.7*  --  12.0* 11.3*  --   HCT 38.0*  --  35.7* 33.4*  --   PLT 199  --  166 204  --   LABPROT  --   < > 19.9* 26.5* 27.0*  INR  --   < > 1.75* 2.54* 2.61*  CREATININE 1.60*  --  1.45* 1.46*  --   TROPONINI <0.30  --   --   --   --   < > = values in this interval not displayed.  Estimated Creatinine Clearance: 44.9 ml/min (by C-G formula based on Cr of 1.46).   Medical History: Past Medical History  Diagnosis Date  . Coronary atherosclerosis of unspecified type of vessel, native or graft     remote cath in 2003 showing distal and branch vessel CAD with normal LV function; managed medically  . Mixed hyperlipidemia   . Glucose intolerance (impaired glucose tolerance)     history of  . Diverticulosis of colon (without mention of hemorrhage)   . Hx of colonic polyps   . Internal hemorrhoids without mention of complication   . Rash and other nonspecific skin eruption   . Pneumonia, organism unspecified   . Conjunctivitis unspecified   . Myasthenia gravis     uses prednisone  . GERD (gastroesophageal reflux disease)   . Pulmonary embolism June 2013  . DVT (deep venous thrombosis) June 2013  . Chronic anticoagulation   . Diabetes mellitus   . DJD (degenerative joint disease)   . Ptosis   . CML (chronic myeloid leukemia)   . HTN (hypertension)   . IBS (irritable bowel syndrome)   . History of pneumonia   .  Dystonia   . High cholesterol   . Heart disease   . Cancer   . 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 PONV (postoperative nausea and vomiting)   . External hemorrhoids without mention of complication     blood clot left 6/13 with pe  . History of kidney stones     Medications:  Scheduled:  . atorvastatin  40 mg Oral q1800  . famotidine  20 mg Oral BID  . guaiFENesin  600 mg Oral BID  . imatinib  400 mg Oral Daily  . insulin aspart  0-5 Units Subcutaneous QHS  . insulin aspart  0-9 Units Subcutaneous TID WC  . ipratropium-albuterol  3 mL Nebulization TID  . isosorbide mononitrate  30 mg Oral Daily  . loperamide  2 mg Oral BH-q7a  . LORazepam  1 mg Oral QHS  . metoprolol succinate  25 mg Oral Daily  . pantoprazole  40 mg Oral BID AC  . predniSONE  60 mg Oral Q breakfast  . pyridostigmine  60 mg Oral TID  . sodium chloride  3 mL Intravenous Q12H  . Warfarin - Pharmacist Dosing Inpatient   Does not apply q1800   Infusions:    Assessment: 73  yr old male with CML (on oral chemo) with a h/o PE on chronic warfarin PTA.  Presents with c/o cough and SOB.  Levaquin 750mg  IV x 1 given in ED for bronchitis  Home dose warfarin: 2.5 mg daily except for 5 mg on Monday.    Last dose taken on 5/9 and INR upon admission = 1.59 (subtherapeutic  5//12: INR remains therapeutic @ 2.61  CBC 5/11: Slight decrease in hgb, plts ok.    Inpatient warfarin doses 5/10-11: 4mg , 2.5mg   Goal of Therapy:  INR 2-3 Monitor platelets by anticoagulation protocol: Yes   Plan:   Warfarin 2.5mg  po x 1   F/u PT/INR, CBC  Ralene Bathe, PharmD, BCPS 06/20/2013, 10:58 AM  Pager: [phone]

## 2013-06-20 NOTE — Discharge Summary (Signed)
Physician Discharge Summary  [name] IHW:[id number] DOB: 02-04-1941 DOA: 06/17/2013  PCP: Renato Shin, MD  Admit date: 06/17/2013 Discharge date: 06/20/2013  Time spent: 45 minutes  Recommendations for Outpatient Follow-up:  -Will be discharged home today. -Advised to follow up with PCP in 2 weeks.   Discharge Diagnoses:  Principal Problem:   Acute bronchitis Active Problems:   LEUKEMIA, MYELOID, CHRONIC   HYPERTENSION   Type II or unspecified type diabetes mellitus without mention of complication, not stated as uncontrolled   Pulmonary embolism   Myasthenia gravis   GERD with stricture   Upper airway cough syndrome   Discharge Condition: Stable and improved  Filed Weights   06/18/13 0650 06/19/13 0509 06/20/13 0537  Weight: 81.33 kg (179 lb 4.8 oz) 81.874 kg (180 lb 8 oz) 82.3 kg (181 lb 7 oz)    History of present illness:  [name] is a 73 y.o. 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  The patient presented with complaints of cough and shortness of breath.  He mentions he had initially cough and shortness of breath in March 2015 admission he was treated with antibiotics for 7 days, his symptoms of cough were not improving and therefore he was given a dose 10 days of antibiotics by his PCP. Again in the end of March his symptoms were not improving and therefore his PCP ordered a CT scan of his chest and since the CT scan was showing some scarring in his symptoms were not improving he was sent to pulmonology for further workup. Pulmonology stopped his lisinopril and asked him to continue PPI and H2 blocker and followup in 6 weeks.  He also was found to have epigastric pain with food for which she has seen gastroenterology who started him on nystatin and thought he could have Candida esophagitis.  He denies any prior history of asthma or COPD, he has a cat at  home, no exposure to chemicals in the past no smoking history.  Initially his sputum was yellow now it is clear white.  Today throughout the day he started having increasingly frequent and harder cough, and later in the night he started having shortness of breath at rest.  Earlier in January he was able to walk 1 mile Without any shortness of breath, and since last to 3 weeks he gets out of breath even walking around the house.  He denies any orthopnea or PND, he has nocturnal cough, he also complains of some night sweats, he has lost 10 pounds over last 1 week due to poor appetite. Occasionally has nausea but no vomiting.  Denies any rash or photophobia.  The patient is coming from home. And at his baseline independent for most of his ADL. Hospitalist admission was requested.   Hospital Course:   Acute Bronchitis -With Parainfluenza + on respiratory virus panel. -Main complaint was cough that has improved with tussionex. -No  Oxygen requirements. -Has been ambulating in the hallways without SOB. -Continue steroid taper as an OP. -Follow up with PCP and pulmonary as scheduled.  CML  -Continue Gleevec and OP follow up with oncology.   Myasthenia Gravis  -continue prednisone and pyridostigmine.   H/o PE  -Continue coumadin.    Procedures:  None   Consultations:  None  Discharge Instructions  Discharge Orders   Future Appointments Provider Department Dept Phone   06/23/2013 9:30 AM Sherren Mocha, MD  [facility] [phone]   06/23/2013 9:50 AM [facility] [phone]   06/26/2013 10:30 AM Renato Shin, MD [facility] [phone]   09/14/2013 8:00 AM Kathrynn Ducking, MD [facility] [phone]   09/15/2013 8:00 AM [facility] [phone]   09/15/2013 8:30 AM Ladell Pier, MD [facility]  [phone]   10/23/2013 10:30 AM Renato Shin, MD [facility] [phone]   Future Orders Complete By Expires   Discontinue IV  As directed    Increase activity slowly  As directed        Medication List    STOP taking these medications       benzonatate 100 MG capsule  Commonly known as:  TESSALON     cefUROXime 500 MG tablet  Commonly known as:  CEFTIN     valsartan 80 MG tablet  Commonly known as:  DIOVAN      TAKE these medications       acetaminophen 500 MG tablet  Commonly known as:  TYLENOL  Take 500 mg by mouth every 6 (six) hours as needed for fever.     albuterol 108 (90 BASE) MCG/ACT inhaler  Commonly known as:  PROVENTIL HFA;VENTOLIN HFA  Inhale 2 puffs into the lungs every 6 (six) hours as needed for wheezing or shortness of breath.     chlorpheniramine-HYDROcodone 10-8 MG/5ML Lqcr  Commonly known as:  TUSSIONEX  Take 5 mLs by mouth 2 (two) times daily as needed for cough.     CRESTOR 10 MG tablet  Generic drug:  rosuvastatin  Take 5 mg by mouth every morning.     guaiFENesin 600 MG 12 hr tablet  Commonly known as:  MUCINEX  Take 1 tablet (600 mg total) by mouth 2 (two) times daily.     imatinib 400 MG tablet  Commonly known as:  GLEEVEC  Take 400 mg by mouth daily.     isosorbide mononitrate 30 MG 24 hr tablet  Commonly known as:  IMDUR  Take 1 tablet (30 mg total) by mouth daily.     loperamide 2 MG capsule  Commonly known as:  IMODIUM  Take 2 mg by mouth every morning. For diarrhea     LORazepam 1 MG tablet  Commonly known as:  ATIVAN  Take 1 tablet (1 mg total) by mouth at bedtime. As needed     metFORMIN 500 MG 24 hr tablet  Commonly known as:  GLUCOPHAGE-XR  TAKE 1 TABLET BY MOUTH EVERY DAY IN THE MORNING     metoprolol succinate 25 MG 24 hr tablet  Commonly known as:  TOPROL-XL  Take 1 tablet (25 mg total) by mouth daily.     multivitamin tablet  Take 1 tablet by mouth daily.     NEXIUM 40 MG capsule    Generic drug:  esomeprazole  Take 40 mg by mouth daily.     OCUVITE PO  Take 1 tablet by mouth daily.     ondansetron 4 MG tablet  Commonly known as:  ZOFRAN  Take 1 tablet (4 mg total) by mouth every 12 (twelve) hours as needed for nausea.     predniSONE 5 MG tablet  Commonly known as:  DELTASONE  Take 5 mg by mouth daily.     predniSONE 10 MG tablet  Commonly known as:  STERAPRED UNI-PAK  Take by mouth daily.  Take 6 tablets today and then decrease by 1 daily until none are left.     pyridostigmine 60 MG tablet  Commonly known as:  MESTINON  Take 1 tablet (60 mg total) by mouth 3 (three) times daily.     traMADol 50 MG tablet  Commonly known as:  ULTRAM  Take 1 tablet (50 mg total) by mouth every 4 (four) hours as needed for pain. For pain     warfarin 5 MG tablet  Commonly known as:  COUMADIN  Take 5 mg by mouth See admin instructions. 74m on Mondays all other days take 2.566m      Allergies  Allergen Reactions  . Oxycodone-Acetaminophen Other (See Comments)    "too strong"  . Sulfamethoxazole Rash and Itching       Follow-up Information   Follow up with ELRenato ShinMD. Schedule an appointment as soon as possible for a visit in 2 weeks.   Specialty:  Endocrinology   Contact information:   [phone]        The results of significant diagnostics from this hospitalization (including imaging, microbiology, ancillary and laboratory) are listed below for reference.    Significant Diagnostic Studies: Dg Chest 2 View  06/18/2013   CLINICAL DATA:  Short of breath.  Cough.  Congestion.  EXAM: CHEST  2 VIEW  COMPARISON:  CT CHEST W/CM dated 05/15/2013; DG CHEST 2 VIEW dated 05/11/2013; DG CHEST 2V dated 05/03/2013  FINDINGS: Cardiopericardial silhouette is mildly enlarged for projection. Aortic arch atherosclerosis. Monitoring leads project over the chest. Basilar atelectasis. No effusion. Monitoring leads project  over the chest.  IMPRESSION: Stable mild cardiomegaly without failure.  Basilar atelectasis.   Electronically Signed   By: GeDereck Ligas.D.   On: 06/18/2013 01:02   UsKoreabdomen Complete  06/02/2013   CLINICAL DATA:  Epigastric pain  EXAM: ULTRASOUND ABDOMEN COMPLETE  COMPARISON:  07/31/2009  FINDINGS: Gallbladder:  No gallstones or wall thickening visualized. No sonographic Murphy sign noted.  Common bile duct:  Diameter: 4.6 mm  Liver:  No focal lesion identified. Within normal limits in parenchymal echogenicity.  IVC:  No abnormality visualized.  Pancreas:  Visualized portion unremarkable. The tail is not well visualized due to overlying bowel gas.  Spleen:  Size and appearance within normal limits.  Right Kidney:  Length: 10.2 cm. Echogenicity within normal limits. No mass or hydronephrosis visualized.  Left Kidney:  Length: 11.1 cm. Echogenicity within normal limits. No mass or hydronephrosis visualized.  Abdominal aorta:  No aneurysm visualized.  Other findings:  None.  IMPRESSION: No acute abnormality noted.   Electronically Signed   By: MaInez Catalina.D.   On: 06/02/2013 08:30    Microbiology: Recent Results (from the past 240 hour(s))  RESPIRATORY VIRUS PANEL     Status: Abnormal   Collection Time    06/18/13 10:44 AM      Result Value Ref Range Status   Source - RVPAN NASAL SWAB   Corrected   Comment: CORRECTED ON 05/11 AT 2011: PREVIOUSLY REPORTED AS NASAL SWAB   Respiratory Syncytial Virus A NOT DETECTED   Final   Respiratory Syncytial Virus B NOT DETECTED   Final   Influenza A NOT DETECTED   Final   Influenza B NOT DETECTED   Final   Parainfluenza 1 NOT DETECTED   Final   Parainfluenza 2 NOT DETECTED   Final   Parainfluenza 3 DETECTED (*)  Final  Metapneumovirus NOT DETECTED   Final   Rhinovirus NOT DETECTED   Final   Adenovirus NOT DETECTED   Final   Influenza A H1 NOT DETECTED   Final   Influenza A H3 NOT DETECTED   Final   Comment: (NOTE)           Normal Reference Range  for each Analyte: NOT DETECTED     Testing performed using the Luminex xTAG Respiratory Viral Panel test     kit.     This test was developed and its performance characteristics determined     by [employer]. It has not been cleared or approved by the [place]     Food and Drug Administration. This test is used for clinical purposes.     It should not be regarded as investigational or for research. This     laboratory is certified under the [place] (CLIA) as qualified to perform high complexity     clinical laboratory testing.     Performed at [facility], EXPECTORATED SPUTUM-ASSESSMENT     Status: None   Collection Time    06/18/13  4:46 PM      Result Value Ref Range Status   Specimen Description SPUTUM   Final   Special Requests NONE   Final   Sputum evaluation     Final   Value: MICROSCOPIC FINDINGS SUGGEST THAT THIS SPECIMEN IS NOT REPRESENTATIVE OF LOWER RESPIRATORY SECRETIONS. PLEASE RECOLLECT.     NOTIFIED ROTH,B AT 1700 ON [id number] BY HOOKER,B   Report Status 06/18/2013 FINAL   Final     Labs: Basic Metabolic Panel:  Recent Labs Lab 06/18/13 0018 06/18/13 0553 06/19/13 0350  NA 139 140 138  K 4.3 4.1 4.1  CL 101 102 102  CO2 _0 GLUCOSE 112* 145* 139*  BUN _1 CREATININE 1.60* 1.45* 1.46*  CALCIUM 9.0 8.3* 8.4   Liver Function Tests:  Recent Labs Lab 06/18/13 0553  AST 26  ALT 23  ALKPHOS 48  BILITOT 0.4  PROT 5.8*  ALBUMIN 3.2*   No results found for this basename: LIPASE, AMYLASE,  in the last 168 hours No results found for this basename: AMMONIA,  in the last 168 hours CBC:  Recent Labs Lab 06/18/13 0018 06/18/13 0553 06/19/13 0350  WBC 6.9 6.0 11.0*  NEUTROABS 4.2  --   --   HGB 12.7* 12.0* 11.3*  HCT 38.0* 35.7* 33.4*  MCV 95.0 95.2 93.6  PLT 199 166 204   Cardiac Enzymes:  Recent Labs Lab 06/18/13 0018  TROPONINI <0.30   BNP: BNP (last 3  results)  Recent Labs  06/18/13 0018  PROBNP 59.6   CBG:  Recent Labs Lab 06/19/13 0741 06/19/13 1131 06/19/13 1724 06/19/13 2148 06/20/13 0723  GLUCAP 112* 131* 218* 212* 102*       Signed:  Erline Hau  [facility] Pager: [phone] 06/20/2013, 3:25 PM

## 2013-06-20 NOTE — Discharge Instructions (Signed)
After you complete the prednisone pack, please resume your 5 mg tablets daily.

## 2013-06-22 ENCOUNTER — Ambulatory Visit: Payer: Medicare Other | Admitting: Cardiovascular Disease

## 2013-06-23 ENCOUNTER — Ambulatory Visit (INDEPENDENT_AMBULATORY_CARE_PROVIDER_SITE_OTHER): Payer: Medicare Other | Admitting: Cardiovascular Disease

## 2013-06-23 ENCOUNTER — Encounter: Payer: Self-pay | Admitting: Cardiovascular Disease

## 2013-06-23 ENCOUNTER — Ambulatory Visit (INDEPENDENT_AMBULATORY_CARE_PROVIDER_SITE_OTHER): Payer: Medicare Other

## 2013-06-23 VITALS — BP 140/70 | HR 74 | Ht 67.0 in | Wt 189.0 lb

## 2013-06-23 DIAGNOSIS — I2510 Atherosclerotic heart disease of native coronary artery without angina pectoris: Secondary | ICD-10-CM

## 2013-06-23 DIAGNOSIS — Z7901 Long term (current) use of anticoagulants: Secondary | ICD-10-CM

## 2013-06-23 DIAGNOSIS — I82409 Acute embolism and thrombosis of unspecified deep veins of unspecified lower extremity: Secondary | ICD-10-CM

## 2013-06-23 DIAGNOSIS — I2699 Other pulmonary embolism without acute cor pulmonale: Secondary | ICD-10-CM

## 2013-06-23 DIAGNOSIS — Z5181 Encounter for therapeutic drug level monitoring: Secondary | ICD-10-CM

## 2013-06-23 LAB — POCT INR: INR: 3

## 2013-06-23 NOTE — Progress Notes (Signed)
HPI:  73 year old gentleman presenting for followup evaluation. The patient has a history of coronary artery disease and remote PCI. His last stress test showed no significant ischemia. 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  The patient was just hospitalized with acute bronchitis. He was released from the hospital 3 days ago. He continues to have a cough, but feels like he is making slow improvement.  He was seen last month as an outpatient and he was describing some discomfort in the upper chest. This prompted a stress Myoview scan with results as outlined below. In summary there was normal perfusion and normal LV function. There were positive EKG changes noted.  From a symptomatic perspective, he has not had any recent cardiac symptoms. He denies palpitations, orthopnea, or PND. He's had dyspnea related to his infection.  Outpatient Encounter Prescriptions as of 06/23/2013  Medication Sig  . acetaminophen (TYLENOL) 500 MG tablet Take 500 mg by mouth every 6 (six) hours as needed for fever.  Marland Kitchen albuterol (PROVENTIL HFA;VENTOLIN HFA) 108 (90 BASE) MCG/ACT inhaler Inhale 2 puffs into the lungs every 6 (six) hours as needed for wheezing or shortness of breath.  . chlorpheniramine-HYDROcodone (TUSSIONEX) 10-8 MG/5ML LQCR Take 5 mLs by mouth 2 (two) times daily as needed for cough.  . esomeprazole (NEXIUM) 40 MG capsule Take 40 mg by mouth daily.  Marland Kitchen guaiFENesin (MUCINEX) 600 MG 12 hr tablet Take 1 tablet (600 mg total) by mouth 2 (two) times daily.  Marland Kitchen imatinib (GLEEVEC) 400 MG tablet Take 400 mg by mouth daily.   . isosorbide mononitrate (IMDUR) 30 MG 24 hr tablet Take 1 tablet (30 mg total) by mouth daily.  Marland Kitchen loperamide (IMODIUM) 2 MG capsule Take 2 mg by mouth every morning. For diarrhea  . LORazepam (ATIVAN) 1 MG tablet Take 1 tablet (1 mg total) by mouth at bedtime. As needed  . metFORMIN (GLUCOPHAGE-XR) 500 MG 24 hr  tablet TAKE 1 TABLET BY MOUTH EVERY DAY IN THE MORNING  . metoprolol succinate (TOPROL-XL) 25 MG 24 hr tablet Take 1 tablet (25 mg total) by mouth daily.  . Multiple Vitamin (MULTIVITAMIN) tablet Take 1 tablet by mouth daily.  . Multiple Vitamins-Minerals (OCUVITE PO) Take 1 tablet by mouth daily.  . ondansetron (ZOFRAN) 4 MG tablet Take 1 tablet (4 mg total) by mouth every 12 (twelve) hours as needed for nausea.  . predniSONE (DELTASONE) 5 MG tablet Take 5 mg by mouth daily.   . predniSONE (STERAPRED UNI-PAK) 10 MG tablet Take by mouth daily. Take 6 tablets today and then decrease by 1 daily until none are left.  . pyridostigmine (MESTINON) 60 MG tablet Take 1 tablet (60 mg total) by mouth 3 (three) times daily.  . rosuvastatin (CRESTOR) 10 MG tablet Take 5 mg by mouth every morning.   . traMADol (ULTRAM) 50 MG tablet Take 1 tablet (50 mg total) by mouth every 4 (four) hours as needed for pain. For pain  . warfarin (COUMADIN) 5 MG tablet Take 5 mg by mouth See admin instructions. 5mg  on Mondays all other days take 2.5mg     Allergies  Allergen Reactions  . Oxycodone-Acetaminophen Other (See Comments)    "too strong"  . Sulfamethoxazole Rash and Itching    Past Medical History  Diagnosis Date  . Coronary atherosclerosis of unspecified type of vessel, native or graft     remote cath in 2003 showing distal and branch vessel CAD with normal LV  function; managed medically  . Mixed hyperlipidemia   . Glucose intolerance (impaired glucose tolerance)     history of  . Diverticulosis of colon (without mention of hemorrhage)   . Hx of colonic polyps   . Internal hemorrhoids without mention of complication   . Rash and other nonspecific skin eruption   . Pneumonia, organism unspecified   . Conjunctivitis unspecified   . Myasthenia gravis     uses prednisone  . GERD (gastroesophageal reflux disease)   . Pulmonary embolism June 2013  . DVT (deep venous thrombosis) June 2013  . Chronic  anticoagulation   . Diabetes mellitus   . DJD (degenerative joint disease)   . Ptosis   . CML (chronic myeloid leukemia)   . HTN (hypertension)   . IBS (irritable bowel syndrome)   . History of pneumonia   . Dystonia   . High cholesterol   . Heart disease   . Cancer   . 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 PONV (postoperative nausea and vomiting)   . External hemorrhoids without mention of complication     blood clot left 6/13 with pe  . History of kidney stones     ROS: Negative except as per HPI  BP 140/70  Pulse 74  Ht 5\' 7"  (1.702 m)  Wt 85.73 kg (189 lb)  BMI 29.59 kg/m2  PHYSICAL EXAM: Pt is alert and oriented, NAD HEENT: normal Neck: JVP - normal, carotids 2+= without bruits Lungs: Coarse breath sounds but no wheezing or crackles CV: RRR without murmur or gallop Abd: soft, NT, Positive BS, no hepatomegaly Ext: Trace pretibial edema bilaterally, distal pulses intact and equal Skin: warm/dry no rash  Stress nuclear study 06/08/2013: QPS  Raw Data Images: Normal; no motion artifact; normal heart/lung ratio.  Stress Images: Normal homogeneous uptake in all areas of the myocardium.  Rest Images: Normal homogeneous uptake in all areas of the myocardium.  Subtraction (SDS): No evidence of ischemia.  Transient Ischemic Dilatation (Normal <1.22): 0.91  Lung/Heart Ratio (Normal <0.45): 0.28  Quantitative Gated Spect Images  QGS EDV: 88 ml  QGS ESV: 27 ml  Impression  Exercise Capacity: Fair exercise capacity.  BP Response: Normal blood pressure response.  Clinical Symptoms: The exercise was limited by dyspnea and fatigue.  ECG Impression: Significant ST abnormalities consistent with ischemia. PVCs and rare couplets are seen.  Comparison with Prior Nuclear Study: No significant change from previous study (previous study was a [place] pharmacological stress test).  Overall Impression: Low risk stress nuclear study with ECG abnormalities that may represent a  "false positive ECG stress test".  LV Ejection Fraction: 69%. LV Wall Motion: NL LV Function; NL Wall Motion  ASSESSMENT AND PLAN: 1. Coronary artery disease, native vessel. Recent low risk Myoview reviewed with the patient. Will continue his current medical program. He is having no anginal symptoms at the present time. I will see him back for followup in one year  2. Hyperlipidemia. He is treated with Crestor. Last lipids: Lipid Panel     Component Value Date/Time   CHOL 128 04/24/2013 1034   TRIG 205* 04/24/2013 1034   HDL 38* 04/24/2013 1034   CHOLHDL 3.4 04/24/2013 1034   VLDL 41* 04/24/2013 1034   LDLCALC 49 04/24/2013 1034   3. Carotid stenosis without history of stroke. Recent Dopplers were reviewed and showed mild to moderate carotid disease. Followup duplex scanning arranged in one year.  4. Hypertension. Blood pressure is controlled on isosorbide and metoprolol.  5. DVT/PE. Patient maintained on long-term warfarin. He denies any bleeding complications  Sherren Mocha 06/23/2013 9:48 AM

## 2013-06-23 NOTE — Patient Instructions (Signed)
Your physician wants you to follow-up in: 1 YEAR with Dr Cooper.  You will receive a reminder letter in the mail two months in advance. If you don't receive a letter, please call our office to schedule the follow-up appointment.  Your physician recommends that you continue on your current medications as directed. Please refer to the Current Medication list given to you today.  

## 2013-06-26 ENCOUNTER — Ambulatory Visit (INDEPENDENT_AMBULATORY_CARE_PROVIDER_SITE_OTHER): Payer: Medicare Other | Admitting: Endocrinology

## 2013-06-26 ENCOUNTER — Encounter: Payer: Self-pay | Admitting: Endocrinology

## 2013-06-26 VITALS — BP 120/82 | HR 74 | Temp 97.7°F | Ht 67.0 in | Wt 183.0 lb

## 2013-06-26 DIAGNOSIS — I2510 Atherosclerotic heart disease of native coronary artery without angina pectoris: Secondary | ICD-10-CM | POA: Diagnosis not present

## 2013-06-26 DIAGNOSIS — J209 Acute bronchitis, unspecified: Secondary | ICD-10-CM | POA: Diagnosis not present

## 2013-06-26 NOTE — Progress Notes (Signed)
Subjective:    Patient ID: [name], male    DOB: [birth year], 73 y.o.   MRN: [id number]  HPI Pt was in the hospital 2 weeks ago for acute bronchitis.  He feels better, but not back to baseline.  He still has left otalgia, and assoc sore throat. Past Medical History  Diagnosis Date  . Coronary atherosclerosis of unspecified type of vessel, native or graft     remote cath in 2003 showing distal and branch vessel CAD with normal LV function; managed medically  . Mixed hyperlipidemia   . Glucose intolerance (impaired glucose tolerance)     history of  . Diverticulosis of colon (without mention of hemorrhage)   . Hx of colonic polyps   . Internal hemorrhoids without mention of complication   . Rash and other nonspecific skin eruption   . Pneumonia, organism unspecified   . Conjunctivitis unspecified   . Myasthenia gravis     uses prednisone  . GERD (gastroesophageal reflux disease)   . Pulmonary embolism June 2013  . DVT (deep venous thrombosis) June 2013  . Chronic anticoagulation   . Diabetes mellitus   . DJD (degenerative joint disease)   . Ptosis   . CML (chronic myeloid leukemia)   . HTN (hypertension)   . IBS (irritable bowel syndrome)   . History of pneumonia   . Dystonia   . High cholesterol   . Heart disease   . Cancer   . 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 PONV (postoperative nausea and vomiting)   . External hemorrhoids without mention of complication     blood clot left 6/13 with pe  . History of kidney stones     Past Surgical History  Procedure Laterality Date  . Angioplasty  2001  . Back surgery      x2, lumbar and cervical  . Tonsillectomy    . Cataract extraction Bilateral 12/12  . Shoulder surgery Left 05/07/2009  . Cardiac catheterization    . Lumbar laminectomy/decompression microdiscectomy Right 09/14/2012    Procedure: Right Lumbar three-four, four-five, Lumbar five-Sacral one decompressive laminectomy;  Surgeon: [name], MD;   Location: [facility];  Service: Neurosurgery;  Laterality: Right;    History   Social History  . Marital Status: Married    Spouse Name: N/A    Number of Children: 2  . Years of Education: 1-College   Occupational History  . retired     Retired   Social History Main Topics  . Smoking status: Never Smoker   . Smokeless tobacco: Never Used  . Alcohol Use: No  . Drug Use: No  . Sexual Activity: Not Currently   Other Topics Concern  . Not on file   Social History Narrative  . No narrative on file    Current Outpatient Prescriptions on File Prior to Visit  Medication Sig Dispense Refill  . acetaminophen (TYLENOL) 500 MG tablet Take 500 mg by mouth every 6 (six) hours as needed for fever.      Marland Kitchen albuterol (PROVENTIL HFA;VENTOLIN HFA) 108 (90 BASE) MCG/ACT inhaler Inhale 2 puffs into the lungs every 6 (six) hours as needed for wheezing or shortness of breath.  1 Inhaler  2  . chlorpheniramine-HYDROcodone (TUSSIONEX) 10-8 MG/5ML LQCR Take 5 mLs by mouth 2 (two) times daily as needed for cough.  115 mL  0  . esomeprazole (NEXIUM) 40 MG capsule Take 40 mg by mouth daily.      Marland Kitchen guaiFENesin (  MUCINEX) 600 MG 12 hr tablet Take 1 tablet (600 mg total) by mouth 2 (two) times daily.      Marland Kitchen imatinib (GLEEVEC) 400 MG tablet Take 400 mg by mouth daily.       . isosorbide mononitrate (IMDUR) 30 MG 24 hr tablet Take 1 tablet (30 mg total) by mouth daily.  90 tablet  3  . loperamide (IMODIUM) 2 MG capsule Take 2 mg by mouth every morning. For diarrhea      . LORazepam (ATIVAN) 1 MG tablet Take 1 tablet (1 mg total) by mouth at bedtime. As needed  30 tablet  1  . metFORMIN (GLUCOPHAGE-XR) 500 MG 24 hr tablet TAKE 1 TABLET BY MOUTH EVERY DAY IN THE MORNING  90 tablet  0  . metoprolol succinate (TOPROL-XL) 25 MG 24 hr tablet Take 1 tablet (25 mg total) by mouth daily.  30 tablet  6  . Multiple Vitamin (MULTIVITAMIN) tablet Take 1 tablet by mouth daily.      . Multiple Vitamins-Minerals (OCUVITE  PO) Take 1 tablet by mouth daily.      . ondansetron (ZOFRAN) 4 MG tablet Take 1 tablet (4 mg total) by mouth every 12 (twelve) hours as needed for nausea.  20 tablet  2  . predniSONE (DELTASONE) 5 MG tablet Take 5 mg by mouth daily.       Marland Kitchen pyridostigmine (MESTINON) 60 MG tablet Take 1 tablet (60 mg total) by mouth 3 (three) times daily.  90 tablet  5  . rosuvastatin (CRESTOR) 10 MG tablet Take 5 mg by mouth every morning.   1 tablet  0  . traMADol (ULTRAM) 50 MG tablet Take 1 tablet (50 mg total) by mouth every 4 (four) hours as needed for pain. For pain  60 tablet  1  . warfarin (COUMADIN) 5 MG tablet Take 5 mg by mouth See admin instructions. 5mg  on Mondays all other days take 2.5mg       . [DISCONTINUED] zolpidem (AMBIEN) 10 MG tablet Take 10 mg by mouth at bedtime as needed.        No current facility-administered medications on file prior to visit.    Allergies  Allergen Reactions  . Oxycodone-Acetaminophen Other (See Comments)    "too strong"  . Sulfamethoxazole Rash and Itching    Family History  Problem Relation Age of Onset  . Heart disease Mother   . Heart attack Mother   . Stroke Mother   . Heart disease Father   . Heart attack Father   . Stroke Father   . Breast cancer Sister     Twin   . Heart attack Sister   . Dementia Sister   . Heart disease Sister   . Heart attack Sister   . Clotting disorder Sister   . Heart attack Sister     BP 120/82  Pulse 74  Temp(Src) 97.7 F (36.5 C) (Oral)  Ht 5\' 7"  (1.702 m)  Wt 183 lb (83.008 kg)  BMI 28.66 kg/m2  SpO2 97%   Review of Systems Denies fever.  He has a dry cough still.      Objective:   Physical Exam VITAL SIGNS:  See vs page GENERAL: no distress head: no deformity eyes: no periorbital swelling, no proptosis external nose and ears are normal mouth: no lesion seen Both eac's and tm's are normal LUNGS:  Clear to auscultation.     i have reviewed the records in epic from other provider(s).   (i  reviewed  CXR report)     Assessment & Plan:  Acute bronchitis, improved.  Pt is ref back to pulm.

## 2013-06-26 NOTE — Patient Instructions (Addendum)
Please go back to se dr wert.  you will receive a phone call, about a day and time for an appointment.

## 2013-07-02 ENCOUNTER — Encounter (HOSPITAL_COMMUNITY): Payer: Self-pay | Admitting: Emergency Medicine

## 2013-07-02 ENCOUNTER — Emergency Department (HOSPITAL_COMMUNITY)
Admission: EM | Admit: 2013-07-02 | Discharge: 2013-07-02 | Disposition: A | Discharge status: Home / Self Care | Payer: Medicare Other | Attending: Emergency Medicine | Admitting: Emergency Medicine

## 2013-07-02 DIAGNOSIS — Z7901 Long term (current) use of anticoagulants: Secondary | ICD-10-CM | POA: Insufficient documentation

## 2013-07-02 DIAGNOSIS — Z8739 Personal history of other diseases of the musculoskeletal system and connective tissue: Secondary | ICD-10-CM | POA: Diagnosis not present

## 2013-07-02 DIAGNOSIS — Z8669 Personal history of other diseases of the nervous system and sense organs: Secondary | ICD-10-CM | POA: Insufficient documentation

## 2013-07-02 DIAGNOSIS — E7800 Pure hypercholesterolemia, unspecified: Secondary | ICD-10-CM | POA: Diagnosis not present

## 2013-07-02 DIAGNOSIS — I2510 Atherosclerotic heart disease of native coronary artery without angina pectoris: Secondary | ICD-10-CM | POA: Diagnosis not present

## 2013-07-02 DIAGNOSIS — Z8701 Personal history of pneumonia (recurrent): Secondary | ICD-10-CM | POA: Diagnosis not present

## 2013-07-02 DIAGNOSIS — Z9861 Coronary angioplasty status: Secondary | ICD-10-CM | POA: Insufficient documentation

## 2013-07-02 DIAGNOSIS — M79605 Pain in left leg: Secondary | ICD-10-CM

## 2013-07-02 DIAGNOSIS — Z860100 Personal history of colon polyps, unspecified: Secondary | ICD-10-CM | POA: Insufficient documentation

## 2013-07-02 DIAGNOSIS — Z86711 Personal history of pulmonary embolism: Secondary | ICD-10-CM | POA: Diagnosis not present

## 2013-07-02 DIAGNOSIS — Z79899 Other long term (current) drug therapy: Secondary | ICD-10-CM | POA: Diagnosis not present

## 2013-07-02 DIAGNOSIS — Z87442 Personal history of urinary calculi: Secondary | ICD-10-CM | POA: Insufficient documentation

## 2013-07-02 DIAGNOSIS — E782 Mixed hyperlipidemia: Secondary | ICD-10-CM | POA: Diagnosis not present

## 2013-07-02 DIAGNOSIS — K219 Gastro-esophageal reflux disease without esophagitis: Secondary | ICD-10-CM | POA: Diagnosis not present

## 2013-07-02 DIAGNOSIS — M79609 Pain in unspecified limb: Secondary | ICD-10-CM | POA: Diagnosis not present

## 2013-07-02 DIAGNOSIS — B372 Candidiasis of skin and nail: Secondary | ICD-10-CM | POA: Insufficient documentation

## 2013-07-02 DIAGNOSIS — I10 Essential (primary) hypertension: Secondary | ICD-10-CM | POA: Insufficient documentation

## 2013-07-02 DIAGNOSIS — Z9889 Other specified postprocedural states: Secondary | ICD-10-CM | POA: Insufficient documentation

## 2013-07-02 DIAGNOSIS — Z859 Personal history of malignant neoplasm, unspecified: Secondary | ICD-10-CM | POA: Insufficient documentation

## 2013-07-02 DIAGNOSIS — Z856 Personal history of leukemia: Secondary | ICD-10-CM | POA: Insufficient documentation

## 2013-07-02 DIAGNOSIS — Z86718 Personal history of other venous thrombosis and embolism: Secondary | ICD-10-CM | POA: Diagnosis not present

## 2013-07-02 DIAGNOSIS — M7989 Other specified soft tissue disorders: Secondary | ICD-10-CM | POA: Diagnosis not present

## 2013-07-02 DIAGNOSIS — IMO0002 Reserved for concepts with insufficient information to code with codable children: Secondary | ICD-10-CM | POA: Insufficient documentation

## 2013-07-02 DIAGNOSIS — Z8719 Personal history of other diseases of the digestive system: Secondary | ICD-10-CM | POA: Diagnosis not present

## 2013-07-02 DIAGNOSIS — E119 Type 2 diabetes mellitus without complications: Secondary | ICD-10-CM | POA: Diagnosis not present

## 2013-07-02 LAB — COMPREHENSIVE METABOLIC PANEL
ALK PHOS: 54 U/L (ref 39–117)
ALT: 37 U/L (ref 0–53)
AST: 31 U/L (ref 0–37)
Albumin: 3.5 g/dL (ref 3.5–5.2)
BUN: 12 mg/dL (ref 6–23)
CO2: 28 mEq/L (ref 19–32)
Calcium: 9.1 mg/dL (ref 8.4–10.5)
Chloride: 101 mEq/L (ref 96–112)
Creatinine, Ser: 1.37 mg/dL — ABNORMAL HIGH (ref 0.50–1.35)
GFR calc non Af Amer: 49 mL/min — ABNORMAL LOW (ref >90)
GFR, EST AFRICAN AMERICAN: 57 mL/min — AB (ref >90)
GLUCOSE: 137 mg/dL — AB (ref 70–99)
Potassium: 3.8 mEq/L (ref 3.7–5.3)
Sodium: 141 mEq/L (ref 137–147)
TOTAL PROTEIN: 5.8 g/dL — AB (ref 6.0–8.3)
Total Bilirubin: 0.6 mg/dL (ref 0.3–1.2)

## 2013-07-02 LAB — CBC WITH DIFFERENTIAL/PLATELET
Basophils Absolute: 0 10*3/uL (ref 0.0–0.1)
Basophils Relative: 0 % (ref 0–1)
EOS PCT: 1 % (ref 0–5)
Eosinophils Absolute: 0.1 10*3/uL (ref 0.0–0.7)
HEMATOCRIT: 38.4 % — AB (ref 39.0–52.0)
HEMOGLOBIN: 13.1 g/dL (ref 13.0–17.0)
LYMPHS ABS: 1.4 10*3/uL (ref 0.7–4.0)
Lymphocytes Relative: 11 % — ABNORMAL LOW (ref 12–46)
MCH: 32 pg (ref 26.0–34.0)
MCHC: 34.1 g/dL (ref 30.0–36.0)
MCV: 93.7 fL (ref 78.0–100.0)
MONOS PCT: 8 % (ref 3–12)
Monocytes Absolute: 0.9 10*3/uL (ref 0.1–1.0)
Neutro Abs: 9.5 10*3/uL — ABNORMAL HIGH (ref 1.7–7.7)
Neutrophils Relative %: 80 % — ABNORMAL HIGH (ref 43–77)
Platelets: 172 10*3/uL (ref 150–400)
RBC: 4.1 MIL/uL — ABNORMAL LOW (ref 4.22–5.81)
RDW: 14.3 % (ref 11.5–15.5)
WBC: 11.8 10*3/uL — ABNORMAL HIGH (ref 4.0–10.5)

## 2013-07-02 LAB — PROTIME-INR
INR: 1.67 — AB (ref 0.00–1.49)
PROTHROMBIN TIME: 19.2 s — AB (ref 11.6–15.2)

## 2013-07-02 MED ORDER — NYSTATIN 100000 UNIT/GM EX CREA: TOPICAL_CREAM | CUTANEOUS | Status: DC

## 2013-07-02 MED ORDER — ENOXAPARIN SODIUM 80 MG/0.8ML ~~LOC~~ SOLN
1.0000 mg/kg | Freq: Once | SUBCUTANEOUS | Status: AC
  Administered 2013-07-02: 80 mg via SUBCUTANEOUS
  Filled 2013-07-02: qty 0.8

## 2013-07-02 NOTE — ED Notes (Signed)
Patient is alert and oriented x3.  He was given DC instructions and follow up visit instructions.  Patient gave verbal understanding.  He was DC ambulatory under his own power to home.  V/S stable.  He was not showing any signs of distress on DC 

## 2013-07-02 NOTE — ED Notes (Signed)
Pt c/o LLE pain and swelling, pain x 2 weeks with swelling to foot onset yesterday. Pt has hx of clot in same leg and PE

## 2013-07-02 NOTE — ED Provider Notes (Addendum)
CSN: [id number]     Arrival date & time 07/02/13  2024 History   First MD Initiated Contact with Patient 07/02/13 2055     Chief Complaint  Patient presents with  . Leg Swelling     (Consider location/radiation/quality/duration/timing/severity/associated sxs/prior Treatment) Patient is a 73 y.o. male presenting with leg pain. The history is provided by the patient.  Leg Pain Location:  Leg Time since incident:  1 week Injury: no   Leg location:  L leg Pain details:    Quality:  Aching   Radiates to:  Does not radiate   Severity:  Mild   Onset quality:  Gradual   Duration:  1 week   Timing:  Constant   Progression:  Worsening Chronicity:  New Dislocation: no   Foreign body present:  No foreign bodies Prior injury to area:  No Relieved by:  Nothing Worsened by:  Bearing weight Ineffective treatments:  None tried Associated symptoms: no fever and no neck pain     Past Medical History  Diagnosis Date  . Coronary atherosclerosis of unspecified type of vessel, native or graft     remote cath in 2003 showing distal and branch vessel CAD with normal LV function; managed medically  . Mixed hyperlipidemia   . Glucose intolerance (impaired glucose tolerance)     history of  . Diverticulosis of colon (without mention of hemorrhage)   . Hx of colonic polyps   . Internal hemorrhoids without mention of complication   . Rash and other nonspecific skin eruption   . Pneumonia, organism unspecified   . Conjunctivitis unspecified   . Myasthenia gravis     uses prednisone  . GERD (gastroesophageal reflux disease)   . Pulmonary embolism June 2013  . DVT (deep venous thrombosis) June 2013  . Chronic anticoagulation   . Diabetes mellitus   . DJD (degenerative joint disease)   . Ptosis   . CML (chronic myeloid leukemia)   . HTN (hypertension)   . IBS (irritable bowel syndrome)   . History of pneumonia   . Dystonia   . High cholesterol   . Heart disease   . Cancer   . Spinal  stenosis of lumbar region 06/01/2012  . PONV (postoperative nausea and vomiting)   . External hemorrhoids without mention of complication     blood clot left 6/13 with pe  . History of kidney stones    Past Surgical History  Procedure Laterality Date  . Angioplasty  2001  . Back surgery      x2, lumbar and cervical  . Tonsillectomy    . Cataract extraction Bilateral 12/12  . Shoulder surgery Left 05/07/2009  . Cardiac catheterization    . Lumbar laminectomy/decompression microdiscectomy Right 09/14/2012    Procedure: Right Lumbar three-four, four-five, Lumbar five-Sacral one decompressive laminectomy;  Surgeon: [name], MD;  Location: [facility];  Service: Neurosurgery;  Laterality: Right;   Family History  Problem Relation Age of Onset  . Heart disease Mother   . Heart attack Mother   . Stroke Mother   . Heart disease Father   . Heart attack Father   . Stroke Father   . Breast cancer Sister     Twin   . Heart attack Sister   . Dementia Sister   . Heart disease Sister   . Heart attack Sister   . Clotting disorder Sister   . Heart attack Sister    History  Substance Use Topics  . Smoking status:  Never Smoker   . Smokeless tobacco: Never Used  . Alcohol Use: No    Review of Systems  Constitutional: Negative for fever.  HENT: Negative for drooling and rhinorrhea.   Eyes: Negative for pain.  Respiratory: Negative for cough and shortness of breath.   Cardiovascular: Negative for chest pain and leg swelling.  Gastrointestinal: Negative for nausea, vomiting, abdominal pain and diarrhea.  Genitourinary: Negative for dysuria and hematuria.  Musculoskeletal: Negative for gait problem and neck pain.  Skin: Negative for color change.  Neurological: Negative for numbness and headaches.  Hematological: Negative for adenopathy.  Psychiatric/Behavioral: Negative for behavioral problems.  All other systems reviewed and are negative.     Allergies   Oxycodone-acetaminophen and Sulfamethoxazole  Home Medications   Prior to Admission medications   Medication Sig Start Date End Date Taking? Authorizing Provider  esomeprazole (NEXIUM) 40 MG capsule Take 40 mg by mouth daily.   Yes Historical Provider, MD  guaiFENesin (MUCINEX) 600 MG 12 hr tablet Take 1 tablet (600 mg total) by mouth 2 (two) times daily. 06/20/13  Yes Erline Hau, MD  imatinib (GLEEVEC) 400 MG tablet Take 400 mg by mouth daily.    Yes Historical Provider, MD  isosorbide mononitrate (IMDUR) 30 MG 24 hr tablet Take 1 tablet (30 mg total) by mouth daily. 11/15/12  Yes Sherren Mocha, MD  loperamide (IMODIUM) 2 MG capsule Take 2 mg by mouth every morning. For diarrhea   Yes Historical Provider, MD  metFORMIN (GLUCOPHAGE-XR) 500 MG 24 hr tablet Take 500 mg by mouth daily with breakfast.   Yes Historical Provider, MD  metoprolol succinate (TOPROL-XL) 25 MG 24 hr tablet Take 1 tablet (25 mg total) by mouth daily. 11/15/12  Yes Sherren Mocha, MD  Multiple Vitamin (MULTIVITAMIN) tablet Take 1 tablet by mouth daily.   Yes Historical Provider, MD  Multiple Vitamins-Minerals (OCUVITE PO) Take 1 tablet by mouth daily.   Yes Historical Provider, MD  predniSONE (DELTASONE) 5 MG tablet Take 5 mg by mouth daily.  12/13/12  Yes Kathrynn Ducking, MD  pyridostigmine (MESTINON) 60 MG tablet Take 1 tablet (60 mg total) by mouth 3 (three) times daily. 11/24/12  Yes Kathrynn Ducking, MD  rosuvastatin (CRESTOR) 10 MG tablet Take 5 mg by mouth every morning.  10/04/12  Yes Sherren Mocha, MD  traMADol (ULTRAM) 50 MG tablet Take 1 tablet (50 mg total) by mouth every 4 (four) hours as needed for pain. For pain 09/16/12  Yes [name], MD  warfarin (COUMADIN) 5 MG tablet Take 5 mg by mouth See admin instructions. 5mg  on Mondays all other days take 2.5mg    Yes Historical Provider, MD  chlorpheniramine-HYDROcodone (TUSSIONEX) 10-8 MG/5ML LQCR Take 5 mLs by mouth 2 (two) times daily as needed for  cough. 06/20/13   Erline Hau, MD   BP 144/81  Temp(Src) 98.2 F (36.8 C)  Resp 16  Ht 5\' 7"  (1.702 m)  Wt 180 lb (81.647 kg)  BMI 28.19 kg/m2  SpO2 96% Physical Exam  Nursing note and vitals reviewed. Constitutional: He is oriented to person, place, and time. He appears well-developed and well-nourished.  HENT:  Head: Normocephalic and atraumatic.  Right Ear: External ear normal.  Left Ear: External ear normal.  Nose: Nose normal.  Mouth/Throat: Oropharynx is clear and moist. No oropharyngeal exudate.  Eyes: Conjunctivae and EOM are normal. Pupils are equal, round, and reactive to light.  Neck: Normal range of motion. Neck supple.  Cardiovascular: Normal  rate, regular rhythm, normal heart sounds and intact distal pulses.  Exam reveals no gallop and no friction rub.   No murmur heard. Pulmonary/Chest: Effort normal and breath sounds normal. No respiratory distress. He has no wheezes.  Abdominal: Soft. Bowel sounds are normal. He exhibits no distension. There is no tenderness. There is no rebound and no guarding.  Genitourinary:  Mild candidal appearing rash in the buttock area w/ satellite lesions.   Musculoskeletal: Normal range of motion. He exhibits no edema and no tenderness.  2+ distal pulses in bilateral lower extremities.  No obvious asymmetry in bilateral lower extremities.  Mild tenderness to palpation of the left posterior calf diffusely.  Neurological: He is alert and oriented to person, place, and time.  Skin: Skin is warm and dry.  Psychiatric: He has a normal mood and affect. His behavior is normal.    ED Course  Procedures (including critical care time) Labs Review Labs Reviewed  CBC WITH DIFFERENTIAL - Abnormal; Notable for the following:    WBC 11.8 (*)    RBC 4.10 (*)    HCT 38.4 (*)    Neutrophils Relative % 80 (*)    Neutro Abs 9.5 (*)    Lymphocytes Relative 11 (*)    All other components within normal limits  COMPREHENSIVE METABOLIC  PANEL - Abnormal; Notable for the following:    Glucose, Bld 137 (*)    Creatinine, Ser 1.37 (*)    Total Protein 5.8 (*)    GFR calc non Af Amer 49 (*)    GFR calc Af Amer 57 (*)    All other components within normal limits  PROTIME-INR - Abnormal; Notable for the following:    Prothrombin Time 19.2 (*)    INR 1.67 (*)    All other components within normal limits    Imaging Review No results found.   EKG Interpretation None      MDM   Final diagnoses:  Left leg pain    10:42 PM 73 y.o. male with a history of PE, DVT on Coumadin who presents with worsening left calf pain for the last week. He denies any fevers, vomiting, diarrhea, shortness of breath, or chest pain. He is afebrile and vital signs are unremarkable here. As we do not have the ability to perform venous duplex at this time will get screening labwork and INR to see if he is therapeutic on his Coumadin.  10:42 PM: Labs non-contrib. INR not therapeutic. Discussed w/ pharmacy. Will give 80mg  lovenox and have pt come back in the AM for [place] to r/o DVT. Provided nystatin cream for likely candidal rash on buttocks. I have discussed the diagnosis/risks/treatment options with the patient and believe the pt to be eligible for discharge home to follow-up with radiology here tomorrow morning. We also discussed returning to the ED immediately if new or worsening sx occur. We discussed the sx which are most concerning (e.g., worsening pain, sob, cp) that necessitate immediate return. Medications administered to the patient during their visit and any new prescriptions provided to the patient are listed below.  Medications given during this visit Medications  enoxaparin (LOVENOX) injection 80 mg (not administered)    New Prescriptions   No medications on file       Blanchard Kelch, MD 07/02/13 2250  Blanchard Kelch, MD 07/02/13 2255

## 2013-07-03 ENCOUNTER — Ambulatory Visit (HOSPITAL_COMMUNITY)
Admission: RE | Admit: 2013-07-03 | Discharge: 2013-07-03 | Disposition: A | Discharge status: Home / Self Care | Payer: Medicare Other | Source: Ambulatory Visit | Attending: Emergency Medicine | Admitting: Emergency Medicine

## 2013-07-03 ENCOUNTER — Telehealth (HOSPITAL_BASED_OUTPATIENT_CLINIC_OR_DEPARTMENT_OTHER): Payer: Self-pay

## 2013-07-03 DIAGNOSIS — M79609 Pain in unspecified limb: Secondary | ICD-10-CM | POA: Diagnosis not present

## 2013-07-03 NOTE — Progress Notes (Signed)
VASCULAR LAB PRELIMINARY  PRELIMINARY  PRELIMINARY  PRELIMINARY  Left lower extremity venous duplex completed.    Preliminary report:  Left:  No evidence of DVT, superficial thrombosis, or Baker's cyst.  [place], RVS 07/03/2013, 2:22 PM

## 2013-07-10 ENCOUNTER — Other Ambulatory Visit: Payer: Medicare Other

## 2013-07-10 ENCOUNTER — Ambulatory Visit: Payer: Medicare Other | Admitting: Oncology

## 2013-07-12 ENCOUNTER — Other Ambulatory Visit: Payer: Self-pay | Admitting: Cardiovascular Disease

## 2013-07-19 DIAGNOSIS — B351 Tinea unguium: Secondary | ICD-10-CM | POA: Diagnosis not present

## 2013-07-19 DIAGNOSIS — L821 Other seborrheic keratosis: Secondary | ICD-10-CM | POA: Diagnosis not present

## 2013-07-19 DIAGNOSIS — L570 Actinic keratosis: Secondary | ICD-10-CM | POA: Diagnosis not present

## 2013-07-19 DIAGNOSIS — B354 Tinea corporis: Secondary | ICD-10-CM | POA: Diagnosis not present

## 2013-07-21 ENCOUNTER — Ambulatory Visit (INDEPENDENT_AMBULATORY_CARE_PROVIDER_SITE_OTHER): Payer: Medicare Other | Admitting: *Deleted

## 2013-07-21 DIAGNOSIS — I2699 Other pulmonary embolism without acute cor pulmonale: Secondary | ICD-10-CM

## 2013-07-21 DIAGNOSIS — Z5181 Encounter for therapeutic drug level monitoring: Secondary | ICD-10-CM

## 2013-07-21 DIAGNOSIS — Z7901 Long term (current) use of anticoagulants: Secondary | ICD-10-CM | POA: Diagnosis not present

## 2013-07-21 DIAGNOSIS — I82409 Acute embolism and thrombosis of unspecified deep veins of unspecified lower extremity: Secondary | ICD-10-CM | POA: Diagnosis not present

## 2013-07-21 LAB — POCT INR: INR: 2.3

## 2013-07-25 ENCOUNTER — Encounter: Payer: Self-pay | Admitting: *Deleted

## 2013-08-01 ENCOUNTER — Ambulatory Visit (INDEPENDENT_AMBULATORY_CARE_PROVIDER_SITE_OTHER): Payer: Medicare Other | Admitting: Pharmacist Clinician (PhC)/ Clinical Pharmacy Specialist

## 2013-08-01 DIAGNOSIS — Z5181 Encounter for therapeutic drug level monitoring: Secondary | ICD-10-CM

## 2013-08-01 DIAGNOSIS — Z7901 Long term (current) use of anticoagulants: Secondary | ICD-10-CM | POA: Diagnosis not present

## 2013-08-01 DIAGNOSIS — I2699 Other pulmonary embolism without acute cor pulmonale: Secondary | ICD-10-CM | POA: Diagnosis not present

## 2013-08-01 LAB — POCT INR: INR: 2

## 2013-08-02 ENCOUNTER — Encounter: Payer: Self-pay | Admitting: Gastroenterology

## 2013-08-04 DIAGNOSIS — G7000 Myasthenia gravis without (acute) exacerbation: Secondary | ICD-10-CM | POA: Diagnosis not present

## 2013-08-04 DIAGNOSIS — K119 Disease of salivary gland, unspecified: Secondary | ICD-10-CM | POA: Diagnosis not present

## 2013-08-04 DIAGNOSIS — N179 Acute kidney failure, unspecified: Secondary | ICD-10-CM | POA: Diagnosis not present

## 2013-08-04 DIAGNOSIS — K222 Esophageal obstruction: Secondary | ICD-10-CM | POA: Diagnosis not present

## 2013-08-04 DIAGNOSIS — R7401 Elevation of levels of liver transaminase levels: Secondary | ICD-10-CM | POA: Diagnosis not present

## 2013-08-04 DIAGNOSIS — R7402 Elevation of levels of lactic acid dehydrogenase (LDH): Secondary | ICD-10-CM | POA: Diagnosis not present

## 2013-08-04 DIAGNOSIS — D630 Anemia in neoplastic disease: Secondary | ICD-10-CM | POA: Diagnosis not present

## 2013-08-04 DIAGNOSIS — C9211 Chronic myeloid leukemia, BCR/ABL-positive, in remission: Secondary | ICD-10-CM | POA: Diagnosis not present

## 2013-08-04 DIAGNOSIS — Z79899 Other long term (current) drug therapy: Secondary | ICD-10-CM | POA: Diagnosis not present

## 2013-08-18 ENCOUNTER — Ambulatory Visit (INDEPENDENT_AMBULATORY_CARE_PROVIDER_SITE_OTHER): Payer: Medicare Other

## 2013-08-18 DIAGNOSIS — Z5181 Encounter for therapeutic drug level monitoring: Secondary | ICD-10-CM | POA: Diagnosis not present

## 2013-08-18 DIAGNOSIS — I2699 Other pulmonary embolism without acute cor pulmonale: Secondary | ICD-10-CM | POA: Diagnosis not present

## 2013-08-18 DIAGNOSIS — Z7901 Long term (current) use of anticoagulants: Secondary | ICD-10-CM

## 2013-08-18 LAB — POCT INR: INR: 1.6

## 2013-08-21 ENCOUNTER — Telehealth: Payer: Self-pay | Admitting: Neurology

## 2013-08-21 NOTE — Telephone Encounter (Signed)
Spouse requested earlier appointment with Dr. Jannifer [name].  Patient having a hard time walking.  Please call and advise.  Thanks

## 2013-08-22 NOTE — Telephone Encounter (Signed)
Appointment scheduled 08/28/13 @ 11.

## 2013-08-28 ENCOUNTER — Ambulatory Visit (INDEPENDENT_AMBULATORY_CARE_PROVIDER_SITE_OTHER): Payer: Medicare Other | Admitting: Neurology

## 2013-08-28 ENCOUNTER — Encounter: Payer: Self-pay | Admitting: Neurology

## 2013-08-28 VITALS — BP 142/71 | HR 59 | Wt 182.0 lb

## 2013-08-28 DIAGNOSIS — I2510 Atherosclerotic heart disease of native coronary artery without angina pectoris: Secondary | ICD-10-CM

## 2013-08-28 DIAGNOSIS — G7000 Myasthenia gravis without (acute) exacerbation: Secondary | ICD-10-CM

## 2013-08-28 DIAGNOSIS — M48061 Spinal stenosis, lumbar region without neurogenic claudication: Secondary | ICD-10-CM

## 2013-08-28 DIAGNOSIS — M79609 Pain in unspecified limb: Secondary | ICD-10-CM | POA: Diagnosis not present

## 2013-08-28 DIAGNOSIS — M79604 Pain in right leg: Secondary | ICD-10-CM

## 2013-08-28 NOTE — Patient Instructions (Signed)
Myasthenia Gravis Myasthenia gravis is a disease that causes muscle weakness throughout the body. The muscles affected are the ones we can control (voluntary muscles). An example of a voluntary muscle is your hand muscles. You can control the muscles to make the hand pick something up. An example of an involuntary muscle is the heart. The heart beats without any direction from you.  Myasthenia Gravis is thought to be an autoimmune disease. That means that normal defenses of the body begin to attack the body. In this case, the immune system begins to attack cells located at the junctions of the muscles and the nerves. Women are affected more often. Women are affected at a younger age than men. Babies born to affected women frequently develop symptoms at an early age. SYMPTOMS Initially in the disease, the facial muscles are affected first. After this, a person may develop droopy eyelids. They may have difficulty controlling facial muscles. They may have problems chewing. Swallowing and speaking may become impaired. The weakness gradually spreads to the arms and legs. It begins to affect breathing. Sometimes, the symptoms lessen or go away without any apparent cause. DIAGNOSIS  Diagnosis can be made with blood tests. Tests such as electromyography may be done to examine the electrical activity in the muscle. An improvement in symptoms after having an anti-cholinesterase drug helps confirm the diagnosis.  TREATMENT  Medicines are usually prescribed as the first treatment. These medicines help, but they do not cure the disease. A plasma cleansing procedure (plasmapheresis) can be used to treat a crisis. It can also be used to prepare a person for surgery. This procedure produces short-term improvement. Some cases are helped by removing the thymus gland. Steroids are used for short-term benefits. Document Released: 05/04/2000 Document Revised: 04/20/2011 Document Reviewed: 01/26/2005 [facility] Patient  Information 2015 [place], [place]. This information is not intended to replace advice given to you by your health care provider. Make sure you discuss any questions you have with your health care provider.

## 2013-08-28 NOTE — Progress Notes (Signed)
Reason for visit: Myasthenia gravis  [name] is an 73 y.o. male  History of present illness:  [name] is a 73 year old right-handed white male with a history of myasthenia gravis primarily with ocular features. The patient has reduced his prednisone once again on his own, going from 5 mg daily to 2.5 mg daily. He was recently in the hospital in May of 2015 with a bout of bronchitis associated with severe coughing. He began having some right-sided back, hip and right leg pain going down to the top of the foot following that hospitalization. The patient has had prior lumbosacral spine surgery. He was found to have severe spinal stenosis at the L4-5 level and evidence of a disc herniation with neuroforaminal stenosis on the right greater than left side at the L5-S1 level on a MRI study done in February of 2014. He had surgery on 09/16/2012. He has done well until just recently. He indicates that the pain is when he tries to walk. He is having problems with sleeping at night. He returns to this office for an evaluation. He denies any double vision, ptosis, or difficulty with chewing or swallowing.  Past Medical History  Diagnosis Date  . Coronary atherosclerosis of unspecified type of vessel, native or graft     remote cath in 2003 showing distal and branch vessel CAD with normal LV function; managed medically  . Mixed hyperlipidemia   . Glucose intolerance (impaired glucose tolerance)     history of  . Diverticulosis of colon (without mention of hemorrhage)   . Hx of colonic polyps   . Internal hemorrhoids without mention of complication   . Rash and other nonspecific skin eruption   . Pneumonia, organism unspecified   . Conjunctivitis unspecified   . Myasthenia gravis     uses prednisone  . GERD (gastroesophageal reflux disease)   . Pulmonary embolism June 2013  . DVT (deep venous thrombosis) June 2013  . Chronic anticoagulation   . Diabetes mellitus   . DJD (degenerative  joint disease)   . Ptosis   . CML (chronic myeloid leukemia)   . HTN (hypertension)   . IBS (irritable bowel syndrome)   . History of pneumonia   . Dystonia   . High cholesterol   . Heart disease   . Cancer   . 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 PONV (postoperative nausea and vomiting)   . External hemorrhoids without mention of complication     blood clot left 6/13 with pe  . History of kidney stones   . Tubulovillous adenoma polyp of colon     2010    Past Surgical History  Procedure Laterality Date  . Angioplasty  2001  . Back surgery      x2, lumbar and cervical  . Tonsillectomy    . Cataract extraction Bilateral 12/12  . Shoulder surgery Left 05/07/2009  . Cardiac catheterization    . Lumbar laminectomy/decompression microdiscectomy Right 09/14/2012    Procedure: Right Lumbar three-four, four-five, Lumbar five-Sacral one decompressive laminectomy;  Surgeon: [name], MD;  Location: [place] [facility];  Service: Neurosurgery;  Laterality: Right;    Family History  Problem Relation Age of Onset  . Heart disease Mother   . Heart attack Mother   . Stroke Mother   . Heart disease Father   . Heart attack Father   . Stroke Father   . Breast cancer Sister     Twin   . Heart  attack Sister   . Dementia Sister   . Heart disease Sister   . Heart attack Sister   . Clotting disorder Sister   . Heart attack Sister     Social history:  reports that he has never smoked. He has never used smokeless tobacco. He reports that he does not drink alcohol or use illicit drugs.    Allergies  Allergen Reactions  . Oxycodone-Acetaminophen Other (See Comments)    "too strong"  . Sulfamethoxazole Rash and Itching    Medications:  Current Outpatient Prescriptions on File Prior to Visit  Medication Sig Dispense Refill  . esomeprazole (NEXIUM) 40 MG capsule Take 40 mg by mouth daily.      Marland Kitchen imatinib (GLEEVEC) 400 MG tablet Take 400 mg by mouth daily.       . isosorbide  mononitrate (IMDUR) 30 MG 24 hr tablet Take 1 tablet (30 mg total) by mouth daily.  90 tablet  3  . loperamide (IMODIUM) 2 MG capsule Take 2 mg by mouth every morning. For diarrhea      . metFORMIN (GLUCOPHAGE-XR) 500 MG 24 hr tablet Take 500 mg by mouth daily with breakfast.      . metoprolol succinate (TOPROL-XL) 25 MG 24 hr tablet TAKE 1 TABLET EVERY DAY  90 tablet  1  . Multiple Vitamin (MULTIVITAMIN) tablet Take 1 tablet by mouth daily.      . Multiple Vitamins-Minerals (OCUVITE PO) Take 1 tablet by mouth daily.      Marland Kitchen nystatin cream (MYCOSTATIN) Apply to affected area 2 times daily  30 g  0  . predniSONE (DELTASONE) 5 MG tablet Take 2.5 mg by mouth daily.       Marland Kitchen pyridostigmine (MESTINON) 60 MG tablet Take 1 tablet (60 mg total) by mouth 3 (three) times daily.  90 tablet  5  . rosuvastatin (CRESTOR) 10 MG tablet Take 5 mg by mouth every morning.   1 tablet  0  . terbinafine (LAMISIL) 250 MG tablet Take 250 mg by mouth daily.      . traMADol (ULTRAM) 50 MG tablet Take 1 tablet (50 mg total) by mouth every 4 (four) hours as needed for pain. For pain  60 tablet  1  . warfarin (COUMADIN) 5 MG tablet Take 5 mg by mouth See admin instructions. 5mg  on Mondays all other days take 2.5mg       . [DISCONTINUED] zolpidem (AMBIEN) 10 MG tablet Take 10 mg by mouth at bedtime as needed.        No current facility-administered medications on file prior to visit.    ROS:  Out of a complete 14 system review of symptoms, the patient complains only of the following symptoms, and all other reviewed systems are negative.  Hearing loss Back pain, achy muscles, leg pain  Blood pressure 142/71, pulse 59, weight 182 lb (82.555 kg).  Physical Exam  General: The patient is alert and cooperative at the time of the examination.  Skin: No significant peripheral edema is noted.   Neurologic Exam  Mental status: The patient is oriented x 3.  Cranial nerves: Facial symmetry is present. Speech is normal, no  aphasia or dysarthria is noted. Extraocular movements are full. Visual fields are full. With superior gaze for 1 minute, no subjective double vision or ptosis is seen.  Motor: The patient has good strength in all 4 extremities, with the exception that there may be some slight proximal muscle weakness, left greater right lower extremities. With the arms  outstretched 1 minute, no fatigable weakness of the deltoid muscles is seen.  Sensory examination: Soft touch sensation is symmetric on the face, arms, and legs.  Coordination: The patient has good finger-nose-finger and heel-to-shin bilaterally.  Gait and station: The patient has a normal gait. Tandem gait is slightly unsteady. Romberg is negative. No drift is seen. The patient is able to walk on heels and the toes.  Reflexes: Deep tendon reflexes are symmetric. The patient has well-maintained knee and ankle jerk reflexes bilaterally.   MRI lumbar 04/06/12:  Impression: Abnormal MRI lumbar spine (without contrast) demonstrating: 1. At L4-5: Disc bulging with facet hypertrophy with severe spinal stenosis and moderate biforaminal stenosis. 2. At L5-S1: Posterior central disc protrusion with annulus tear, facet hypertrophy with moderate spinal stenosis, severe right and mild left foraminal stenosis. 3. At L3-4: Rightward disc bulging and facet hypertrophy with right lateral recess stenosis, mild spinal stenosis, moderate-severe  right foraminal stenosis. 4. At L2-3: Disc bulging with mild spinal stenosis and moderate biforaminal stenosis.    Assessment/Plan:  1. Myasthenia gravis with ocular features  2. Lumbosacral spine disease, history of lumbosacral surgery  The patient is having recurrence of pain down the right leg. Previously, he had shock sensations down the right leg. He will be set up for MRI evaluation of the low back with and without gadolinium enhancement. He does have some chronic renal insufficiency that appears to be  relatively stable. He will continue his prednisone at 2.5 mg daily as he seems to be doing well with this. He will followup in about 4 or 5 months.  Jill Alexanders MD 08/28/2013 7:05 PM  [facility] [address] [place], [place] [address]  Phone [phone] Fax [phone]

## 2013-08-29 ENCOUNTER — Ambulatory Visit: Payer: Medicare Other | Admitting: Internal Medicine

## 2013-09-01 ENCOUNTER — Ambulatory Visit (INDEPENDENT_AMBULATORY_CARE_PROVIDER_SITE_OTHER): Payer: Medicare Other | Admitting: Pharmacist

## 2013-09-01 DIAGNOSIS — Z5181 Encounter for therapeutic drug level monitoring: Secondary | ICD-10-CM | POA: Diagnosis not present

## 2013-09-01 DIAGNOSIS — I2699 Other pulmonary embolism without acute cor pulmonale: Secondary | ICD-10-CM

## 2013-09-01 DIAGNOSIS — Z7901 Long term (current) use of anticoagulants: Secondary | ICD-10-CM

## 2013-09-01 LAB — POCT INR: INR: 1.8

## 2013-09-07 DIAGNOSIS — M549 Dorsalgia, unspecified: Secondary | ICD-10-CM | POA: Diagnosis not present

## 2013-09-08 ENCOUNTER — Telehealth: Payer: Self-pay | Admitting: Neurology

## 2013-09-08 ENCOUNTER — Ambulatory Visit
Admission: RE | Admit: 2013-09-08 | Discharge: 2013-09-08 | Disposition: A | Discharge status: Home / Self Care | Payer: Medicare Other | Source: Ambulatory Visit | Attending: Neurology | Admitting: Neurology

## 2013-09-08 DIAGNOSIS — M79604 Pain in right leg: Secondary | ICD-10-CM

## 2013-09-08 DIAGNOSIS — M47817 Spondylosis without myelopathy or radiculopathy, lumbosacral region: Secondary | ICD-10-CM | POA: Diagnosis not present

## 2013-09-08 DIAGNOSIS — M5137 Other intervertebral disc degeneration, lumbosacral region: Secondary | ICD-10-CM | POA: Diagnosis not present

## 2013-09-08 MED ORDER — GADOBENATE DIMEGLUMINE 529 MG/ML IV SOLN
8.0000 mL | Freq: Once | INTRAVENOUS | Status: AC | PRN
  Administered 2013-09-08: 8 mL via INTRAVENOUS

## 2013-09-08 NOTE — Telephone Encounter (Signed)
  I called patient. The patient has had MRI evaluation of the low back, shows severe degenerative changes and facet joint arthritis multiple levels, possible neuroforaminal stenosis on the left side at L4-5 and L5-S1 levels. The patient has mainly right-sided leg pain, however. May consider epidural steroid injection for the right leg discomfort. Left a message with the patient.   MRI lumbar 09/08/2013:  Impression   Abnormal MRI scan of the lumbar spine showing severe disc and  facet degenerative changes throughout with stable postoperative changes of  right laminectomy at L 3-4, L4-5 and L5-S1. There is severe left-sided  foraminal narrowing at L4-5 and L5-S1.

## 2013-09-09 ENCOUNTER — Encounter: Payer: Self-pay | Admitting: Neurology

## 2013-09-14 ENCOUNTER — Ambulatory Visit: Payer: Self-pay | Admitting: Neurology

## 2013-09-14 ENCOUNTER — Telehealth: Payer: Self-pay | Admitting: *Deleted

## 2013-09-14 NOTE — Telephone Encounter (Signed)
Informed pt he will not need lab in the morning, (done with Dr. Florene Glen). Come in at 0830 for office visit with Dr. Benay Spice. He voiced understanding.

## 2013-09-15 ENCOUNTER — Ambulatory Visit (HOSPITAL_BASED_OUTPATIENT_CLINIC_OR_DEPARTMENT_OTHER): Payer: Medicare Other | Admitting: Oncology

## 2013-09-15 ENCOUNTER — Telehealth: Payer: Self-pay | Admitting: Oncology

## 2013-09-15 ENCOUNTER — Ambulatory Visit (INDEPENDENT_AMBULATORY_CARE_PROVIDER_SITE_OTHER): Payer: Medicare Other | Admitting: *Deleted

## 2013-09-15 ENCOUNTER — Other Ambulatory Visit: Payer: Medicare Other

## 2013-09-15 VITALS — BP 130/71 | HR 58 | Temp 98.4°F | Resp 18 | Ht 67.0 in | Wt 184.0 lb

## 2013-09-15 DIAGNOSIS — Z7901 Long term (current) use of anticoagulants: Secondary | ICD-10-CM

## 2013-09-15 DIAGNOSIS — Z86718 Personal history of other venous thrombosis and embolism: Secondary | ICD-10-CM | POA: Diagnosis not present

## 2013-09-15 DIAGNOSIS — R197 Diarrhea, unspecified: Secondary | ICD-10-CM

## 2013-09-15 DIAGNOSIS — I2699 Other pulmonary embolism without acute cor pulmonale: Secondary | ICD-10-CM

## 2013-09-15 DIAGNOSIS — E119 Type 2 diabetes mellitus without complications: Secondary | ICD-10-CM

## 2013-09-15 DIAGNOSIS — C9210 Chronic myeloid leukemia, BCR/ABL-positive, not having achieved remission: Secondary | ICD-10-CM | POA: Diagnosis not present

## 2013-09-15 DIAGNOSIS — Z5181 Encounter for therapeutic drug level monitoring: Secondary | ICD-10-CM | POA: Diagnosis not present

## 2013-09-15 DIAGNOSIS — R110 Nausea: Secondary | ICD-10-CM | POA: Diagnosis not present

## 2013-09-15 LAB — POCT INR: INR: 2.9

## 2013-09-15 MED ORDER — LORAZEPAM 0.5 MG PO TABS: 0.5000 mg | ORAL_TABLET | Freq: Every evening | ORAL | Status: DC | PRN

## 2013-09-15 NOTE — Telephone Encounter (Signed)
Pt confirmed ov per 08/07 POF, gave pt AVS..Marland KitchenKJ

## 2013-09-15 NOTE — Progress Notes (Signed)
  [facility] OFFICE PROGRESS NOTE   Diagnosis: CML  INTERVAL HISTORY:   Mr. Worthington returns as scheduled. He continues [place]. He is followed closely by Dr. Florene Glen. Diarrhea is controlled with Imodium. His chief complaint is lower back pain radiating into the right leg. He sees Dr. Jannifer Franklin and is being scheduled for "steroid injections ". The myasthenia gravis symptoms have improved.  He reports developing bilateral inguinal lymphadenopathy several months ago. This has resolved.  Objective:  Vital signs in last 24 hours:  Blood pressure 130/71, pulse 58, temperature 98.4 F (36.9 C), temperature source Oral, resp. rate 18, height 5\' 7"  (1.702 m), weight 184 lb (83.462 kg), SpO2 98.00%.    HEENT: Thick whitecoat over the tongue,? Mild right buccal thrush Lymphatics: No discreet inguinal nodes, soft fullness medial to the inguinal crease bilaterally  Resp: Lungs clear bilaterally Cardio:  Regular rate and rhythm GI:  No hepatosplenomegaly Vascular:  Trace low pretibial edema bilaterally  Skin: no rash     Lab Results:  Lab Results  Component Value Date   WBC 11.8* 07/02/2013   HGB 13.1 07/02/2013   HCT 38.4* 07/02/2013   MCV 93.7 07/02/2013   PLT 172 07/02/2013   NEUTROABS 9.5* 07/02/2013  08/04/2013 at [facility] 12, platelets 173,000, white count 6.9, ANC 4.2   BCR/ABL PCR-0.0001   Medications: I have reviewed the patient's current medications.  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 He sees Dr. Florene Glen for PCR monitoring. 2. Intermittent nausea and diarrhea, likely related to [place]. 3. History of periorbital and extremity edema secondary to [name]. 4. Myasthenia gravis-followed by Dr. Jannifer Franklin 5. Diabetes 6. History of coronary artery disease 7. Deep vein thrombosis/pulmonary embolism in June 2013-now maintained on Coumadin, managed at the [facility] Cou[facility]  cl[facility]  Disposition:  Mr. Grave remains in remission from the [facility]. He will continue PCR monitoring with Dr. Florene Glen. He will contact us for recurrent inguinal lymphadenopathy or progressive thrush. Mr. Maggart will return for an office visit in 6 months.  Betsy Coder, MD  09/15/2013  8:46 AM

## 2013-09-20 ENCOUNTER — Encounter: Payer: Self-pay | Admitting: Neurology

## 2013-09-25 DIAGNOSIS — L821 Other seborrheic keratosis: Secondary | ICD-10-CM | POA: Diagnosis not present

## 2013-09-25 DIAGNOSIS — B351 Tinea unguium: Secondary | ICD-10-CM | POA: Diagnosis not present

## 2013-09-26 ENCOUNTER — Telehealth: Payer: Self-pay | Admitting: Neurology

## 2013-09-26 ENCOUNTER — Telehealth: Payer: Self-pay | Admitting: Cardiovascular Disease

## 2013-09-26 ENCOUNTER — Other Ambulatory Visit: Payer: Self-pay | Admitting: Endocrinology

## 2013-09-26 DIAGNOSIS — M47817 Spondylosis without myelopathy or radiculopathy, lumbosacral region: Secondary | ICD-10-CM

## 2013-09-26 NOTE — Telephone Encounter (Signed)
I called patient. He wishes to go on and have an epidural steroid injection. I'll get this set up. I will get him set up for physical therapy as he reports some left leg weakness. The patient does have evidence of neuroforaminal stenosis at the L4-5 and L5-S1 levels, and this could be leading to some of the left leg weakness issues.

## 2013-09-26 NOTE — Telephone Encounter (Signed)
New message          Manufacture change from [employer] to Teva for pt coumadin medication

## 2013-09-26 NOTE — Telephone Encounter (Signed)
Noted, Pt aware.

## 2013-09-27 DIAGNOSIS — H40009 Preglaucoma, unspecified, unspecified eye: Secondary | ICD-10-CM | POA: Diagnosis not present

## 2013-10-06 ENCOUNTER — Ambulatory Visit (INDEPENDENT_AMBULATORY_CARE_PROVIDER_SITE_OTHER): Payer: Medicare Other | Admitting: *Deleted

## 2013-10-06 ENCOUNTER — Ambulatory Visit: Payer: Medicare Other | Attending: Neurology

## 2013-10-06 DIAGNOSIS — Z5189 Encounter for other specified aftercare: Secondary | ICD-10-CM | POA: Insufficient documentation

## 2013-10-06 DIAGNOSIS — IMO0002 Reserved for concepts with insufficient information to code with codable children: Secondary | ICD-10-CM | POA: Diagnosis not present

## 2013-10-06 DIAGNOSIS — M5412 Radiculopathy, cervical region: Secondary | ICD-10-CM | POA: Insufficient documentation

## 2013-10-06 DIAGNOSIS — M25619 Stiffness of unspecified shoulder, not elsewhere classified: Secondary | ICD-10-CM | POA: Insufficient documentation

## 2013-10-06 DIAGNOSIS — G7001 Myasthenia gravis with (acute) exacerbation: Secondary | ICD-10-CM | POA: Insufficient documentation

## 2013-10-06 DIAGNOSIS — Z5181 Encounter for therapeutic drug level monitoring: Secondary | ICD-10-CM | POA: Diagnosis not present

## 2013-10-06 DIAGNOSIS — I2699 Other pulmonary embolism without acute cor pulmonale: Secondary | ICD-10-CM

## 2013-10-06 DIAGNOSIS — Z7901 Long term (current) use of anticoagulants: Secondary | ICD-10-CM | POA: Diagnosis not present

## 2013-10-06 LAB — POCT INR: INR: 2.3

## 2013-10-09 ENCOUNTER — Ambulatory Visit: Payer: Medicare Other | Admitting: Physical Therapy

## 2013-10-10 ENCOUNTER — Encounter: Payer: Self-pay | Admitting: Neurology

## 2013-10-11 ENCOUNTER — Telehealth: Payer: Self-pay | Admitting: Neurology

## 2013-10-11 NOTE — Telephone Encounter (Signed)
I called patient. The patient reports some blurring of vision, but he also has some problems with ptosis of the eyes that has developed over the last month or 2. The patient indicates that closing one eye or the other does not improve the blurring of vision. The patient will go up on the prednisone taking 10 mg daily to see this helps the problem with ptosis. The patient does have new glasses, hopefully this will help the blurred vision.

## 2013-10-12 DIAGNOSIS — H5789 Other specified disorders of eye and adnexa: Secondary | ICD-10-CM | POA: Diagnosis not present

## 2013-10-13 ENCOUNTER — Ambulatory Visit: Payer: Medicare Other | Attending: Neurology

## 2013-10-13 DIAGNOSIS — IMO0002 Reserved for concepts with insufficient information to code with codable children: Secondary | ICD-10-CM | POA: Insufficient documentation

## 2013-10-13 DIAGNOSIS — M5412 Radiculopathy, cervical region: Secondary | ICD-10-CM | POA: Diagnosis not present

## 2013-10-13 DIAGNOSIS — M25619 Stiffness of unspecified shoulder, not elsewhere classified: Secondary | ICD-10-CM | POA: Diagnosis not present

## 2013-10-13 DIAGNOSIS — Z5189 Encounter for other specified aftercare: Secondary | ICD-10-CM | POA: Insufficient documentation

## 2013-10-13 DIAGNOSIS — G7001 Myasthenia gravis with (acute) exacerbation: Secondary | ICD-10-CM | POA: Insufficient documentation

## 2013-10-16 ENCOUNTER — Encounter: Payer: Self-pay | Admitting: Neurology

## 2013-10-17 ENCOUNTER — Ambulatory Visit: Payer: Medicare Other

## 2013-10-17 DIAGNOSIS — Z5189 Encounter for other specified aftercare: Secondary | ICD-10-CM | POA: Diagnosis not present

## 2013-10-19 ENCOUNTER — Ambulatory Visit (INDEPENDENT_AMBULATORY_CARE_PROVIDER_SITE_OTHER): Payer: Medicare Other | Admitting: Neurology

## 2013-10-19 ENCOUNTER — Encounter: Payer: Self-pay | Admitting: Neurology

## 2013-10-19 ENCOUNTER — Ambulatory Visit: Payer: Medicare Other

## 2013-10-19 VITALS — BP 132/72 | HR 60 | Wt 180.0 lb

## 2013-10-19 DIAGNOSIS — G7000 Myasthenia gravis without (acute) exacerbation: Secondary | ICD-10-CM

## 2013-10-19 DIAGNOSIS — I2510 Atherosclerotic heart disease of native coronary artery without angina pectoris: Secondary | ICD-10-CM

## 2013-10-19 DIAGNOSIS — Z5189 Encounter for other specified aftercare: Secondary | ICD-10-CM | POA: Diagnosis not present

## 2013-10-19 MED ORDER — PREDNISONE 5 MG PO TABS: 15.0000 mg | ORAL_TABLET | Freq: Every day | ORAL | Status: DC

## 2013-10-19 NOTE — Patient Instructions (Signed)
Myasthenia Gravis Myasthenia gravis is a disease that causes muscle weakness throughout the body. The muscles affected are the ones we can control (voluntary muscles). An example of a voluntary muscle is your hand muscles. You can control the muscles to make the hand pick something up. An example of an involuntary muscle is the heart. The heart beats without any direction from you.  Myasthenia Gravis is thought to be an autoimmune disease. That means that normal defenses of the body begin to attack the body. In this case, the immune system begins to attack cells located at the junctions of the muscles and the nerves. Women are affected more often. Women are affected at a younger age than men. Babies born to affected women frequently develop symptoms at an early age. SYMPTOMS Initially in the disease, the facial muscles are affected first. After this, a person may develop droopy eyelids. They may have difficulty controlling facial muscles. They may have problems chewing. Swallowing and speaking may become impaired. The weakness gradually spreads to the arms and legs. It begins to affect breathing. Sometimes, the symptoms lessen or go away without any apparent cause. DIAGNOSIS  Diagnosis can be made with blood tests. Tests such as electromyography may be done to examine the electrical activity in the muscle. An improvement in symptoms after having an anti-cholinesterase drug helps confirm the diagnosis.  TREATMENT  Medicines are usually prescribed as the first treatment. These medicines help, but they do not cure the disease. A plasma cleansing procedure (plasmapheresis) can be used to treat a crisis. It can also be used to prepare a person for surgery. This procedure produces short-term improvement. Some cases are helped by removing the thymus gland. Steroids are used for short-term benefits. Document Released: 05/04/2000 Document Revised: 04/20/2011 Document Reviewed: 03/29/2013 [employer] Patient  Information 2015 [employer]. This information is not intended to replace advice given to you by your health care provider. Make sure you discuss any questions you have with your health care provider.  

## 2013-10-19 NOTE — Progress Notes (Signed)
Reason for visit: Myasthenia gravis  [name] is an 73 y.o. male  History of present illness:  [name] is a 73 year old right-handed white male with a history of myasthenia gravis primarily ocular features. The patient has a tendency to self regulate his prednisone dosing. The patient was on prednisone at 10 mg daily, but around 10/14/2013, he noted some worsening of double vision, and some blurring of vision. He has not had a lot of problems with ptosis. He has gone up on the prednisone taking 15 mg daily. The patient believes that there is some fatigability of his arms and legs. The patient has not had any problems with chewing or swallowing. The patient indicates that when he covers one eye or the other, the double vision will go away, but at times the blurring of vision does not improve. He has been to his ophthalmologist, and he will be evaluated again in early October 2015. The patient returns to this office for an evaluation.  Past Medical History  Diagnosis Date  . Coronary atherosclerosis of unspecified type of vessel, native or graft     remote cath in 2003 showing distal and branch vessel CAD with normal LV function; managed medically  . Mixed hyperlipidemia   . Glucose intolerance (impaired glucose tolerance)     history of  . Diverticulosis of colon (without mention of hemorrhage)   . Hx of colonic polyps   . Internal hemorrhoids without mention of complication   . Rash and other nonspecific skin eruption   . Pneumonia, organism unspecified   . Conjunctivitis unspecified   . Myasthenia gravis     uses prednisone  . GERD (gastroesophageal reflux disease)   . Pulmonary embolism June 2013  . DVT (deep venous thrombosis) June 2013  . Chronic anticoagulation   . Diabetes mellitus   . DJD (degenerative joint disease)   . Ptosis   . CML (chronic myeloid leukemia)   . HTN (hypertension)   . IBS (irritable bowel syndrome)   . History of pneumonia   . Dystonia   .  High cholesterol   . Heart disease   . Cancer   . 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 PONV (postoperative nausea and vomiting)   . External hemorrhoids without mention of complication     blood clot left 6/13 with pe  . History of kidney stones   . Tubulovillous adenoma polyp of colon     2010    Past Surgical History  Procedure Laterality Date  . Angioplasty  2001  . Back surgery      x2, lumbar and cervical  . Tonsillectomy    . Cataract extraction Bilateral 12/12  . Shoulder surgery Left 05/07/2009  . Cardiac catheterization    . Lumbar laminectomy/decompression microdiscectomy Right 09/14/2012    Procedure: Right Lumbar three-four, four-five, Lumbar five-Sacral one decompressive laminectomy;  Surgeon: [name], MD;  Location: [place] [facility];  Service: Neurosurgery;  Laterality: Right;    Family History  Problem Relation Age of Onset  . Heart disease Mother   . Heart attack Mother   . Stroke Mother   . Heart disease Father   . Heart attack Father   . Stroke Father   . Breast cancer Sister     Twin   . Heart attack Sister   . Dementia Sister   . Heart disease Sister   . Heart attack Sister   . Clotting disorder Sister   . Heart  attack Sister     Social history:  reports that he has never smoked. He has never used smokeless tobacco. He reports that he does not drink alcohol or use illicit drugs.    Allergies  Allergen Reactions  . Oxycodone-Acetaminophen Other (See Comments)    "too strong"  . Sulfamethoxazole Rash and Itching    Medications:  Current Outpatient Prescriptions on File Prior to Visit  Medication Sig Dispense Refill  . esomeprazole (NEXIUM) 40 MG capsule Take 40 mg by mouth daily.      Marland Kitchen imatinib (GLEEVEC) 400 MG tablet Take 400 mg by mouth daily.       . isosorbide mononitrate (IMDUR) 30 MG 24 hr tablet Take 1 tablet (30 mg total) by mouth daily.  90 tablet  3  . loperamide (IMODIUM) 2 MG capsule Take 2 mg by mouth every morning.  For diarrhea      . LORazepam (ATIVAN) 0.5 MG tablet Take 1 tablet (0.5 mg total) by mouth at bedtime as needed.  30 tablet  1  . metFORMIN (GLUCOPHAGE-XR) 500 MG 24 hr tablet TAKE 1 TABLET BY MOUTH EVERY DAY IN THE MORNING  90 tablet  0  . metoprolol succinate (TOPROL-XL) 25 MG 24 hr tablet TAKE 1 TABLET EVERY DAY  90 tablet  1  . Multiple Vitamin (MULTIVITAMIN) tablet Take 1 tablet by mouth daily.      . Multiple Vitamins-Minerals (OCUVITE PO) Take 1 tablet by mouth daily.      . ondansetron (ZOFRAN) 4 MG tablet Take 4 mg by mouth every 6 (six) hours as needed.      . pyridostigmine (MESTINON) 60 MG tablet Take 1 tablet (60 mg total) by mouth 3 (three) times daily.  90 tablet  5  . rosuvastatin (CRESTOR) 10 MG tablet Take 5 mg by mouth every morning.   1 tablet  0  . terbinafine (LAMISIL) 250 MG tablet Take 250 mg by mouth daily.      . traMADol (ULTRAM) 50 MG tablet Take 1 tablet (50 mg total) by mouth every 4 (four) hours as needed for pain. For pain  60 tablet  1  . warfarin (COUMADIN) 5 MG tablet Take 5 mg by mouth See admin instructions. 5mg  on Mondays all other days take 2.5mg       . [DISCONTINUED] zolpidem (AMBIEN) 10 MG tablet Take 10 mg by mouth at bedtime as needed.        No current facility-administered medications on file prior to visit.    ROS:  Out of a complete 14 system review of symptoms, the patient complains only of the following symptoms, and all other reviewed systems are negative.  Double vision, blurred vision Back pain  Blood pressure 132/72, pulse 60, weight 180 lb (81.647 kg).  Physical Exam  General: The patient is alert and cooperative at the time of the examination.  Skin: No significant peripheral edema is noted.   Neurologic Exam  Mental status: The patient is oriented x 3.  Cranial nerves: Facial symmetry is present. Speech is normal, no aphasia or dysarthria is noted. Extraocular movements are full. Visual fields are full. With superior gaze  for 1 minute, the patient has no increased ptosis or divergent gaze, no subjective reports of double vision. The patient has good facial muscle strength, and good strength with jaw opening and closing.  Motor: The patient has good strength in all 4 extremities. With the arms outstretched 1 minute, no fatigable weakness of the deltoid muscles is seen.  Sensory examination: Soft touch sensation is symmetric on the face, arms, and legs.  Coordination: The patient has good finger-nose-finger and heel-to-shin bilaterally.  Gait and station: The patient has a normal gait. Tandem gait is slightly unsteady. Romberg is negative. No drift is seen.  Reflexes: Deep tendon reflexes are symmetric.   Assessment/Plan:  One. Myasthenia gravis with ocular features  The patient had an exacerbation of his myasthenia gravis, he will remain on 15 mg of prednisone a day at this time. He has a follow up on 01/02/2014, and he will keep that appointment. We will need to reduce the dose of prednisone in the future slowly. The patient has a tendency to self regulate his medications.  Jill Alexanders MD 10/19/2013 7:19 PM  [facility] [address] [place], [place] [address]  Phone [phone] Fax [phone]

## 2013-10-23 ENCOUNTER — Ambulatory Visit: Payer: Medicare Other | Admitting: Physical Therapy

## 2013-10-23 ENCOUNTER — Ambulatory Visit (INDEPENDENT_AMBULATORY_CARE_PROVIDER_SITE_OTHER): Payer: Medicare Other | Admitting: Endocrinology

## 2013-10-23 ENCOUNTER — Encounter: Payer: Self-pay | Admitting: Endocrinology

## 2013-10-23 VITALS — BP 122/64 | HR 88 | Temp 98.1°F | Wt 182.0 lb

## 2013-10-23 DIAGNOSIS — I2510 Atherosclerotic heart disease of native coronary artery without angina pectoris: Secondary | ICD-10-CM

## 2013-10-23 DIAGNOSIS — Z23 Encounter for immunization: Secondary | ICD-10-CM | POA: Diagnosis not present

## 2013-10-23 DIAGNOSIS — Z0000 Encounter for general adult medical examination without abnormal findings: Secondary | ICD-10-CM | POA: Diagnosis not present

## 2013-10-23 DIAGNOSIS — E119 Type 2 diabetes mellitus without complications: Secondary | ICD-10-CM

## 2013-10-23 DIAGNOSIS — Z5189 Encounter for other specified aftercare: Secondary | ICD-10-CM | POA: Diagnosis not present

## 2013-10-23 LAB — BASIC METABOLIC PANEL
BUN: 13 mg/dL (ref 6–23)
CALCIUM: 8.8 mg/dL (ref 8.4–10.5)
CO2: 22 mEq/L (ref 19–32)
CREATININE: 1.3 mg/dL (ref 0.4–1.5)
Chloride: 106 mEq/L (ref 96–112)
GFR: 58.15 mL/min — ABNORMAL LOW (ref >60.00)
Glucose, Bld: 123 mg/dL — ABNORMAL HIGH (ref 70–99)
Potassium: 4.1 mEq/L (ref 3.5–5.1)
Sodium: 141 mEq/L (ref 135–145)

## 2013-10-23 LAB — HEMOGLOBIN A1C: HEMOGLOBIN A1C: 6 % (ref 4.6–6.5)

## 2013-10-23 NOTE — Patient Instructions (Addendum)
blood tests are being requested for you today.  We'll contact you with results. Here is a list of numbers you can call to have a meter and strips sent to your home. Please come back for a "medicare wellness" appointment in 6 months (after 04/25/14).

## 2013-10-23 NOTE — Progress Notes (Signed)
Subjective:    Patient ID: [name], male    DOB: [birth year], 73 y.o.   MRN: [id number]  HPI Pt returns for f/u of type 2 DM (dx'ed 1062; no known complications; he has never taken insulin).  He has been on the increased prednisone x 1 week, due to an exacerbation of MG.  He has moderate exacerbation of chroic weakness of the eyelids, but no assoc numbness.   Past Medical History  Diagnosis Date  . Coronary atherosclerosis of unspecified type of vessel, native or graft     remote cath in 2003 showing distal and branch vessel CAD with normal LV function; managed medically  . Mixed hyperlipidemia   . Glucose intolerance (impaired glucose tolerance)     history of  . Diverticulosis of colon (without mention of hemorrhage)   . Hx of colonic polyps   . Internal hemorrhoids without mention of complication   . Rash and other nonspecific skin eruption   . Pneumonia, organism unspecified   . Conjunctivitis unspecified   . Myasthenia gravis     uses prednisone  . GERD (gastroesophageal reflux disease)   . Pulmonary embolism June 2013  . DVT (deep venous thrombosis) June 2013  . Chronic anticoagulation   . Diabetes mellitus   . DJD (degenerative joint disease)   . Ptosis   . CML (chronic myeloid leukemia)   . HTN (hypertension)   . IBS (irritable bowel syndrome)   . History of pneumonia   . Dystonia   . High cholesterol   . Heart disease   . Cancer   . 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 PONV (postoperative nausea and vomiting)   . External hemorrhoids without mention of complication     blood clot left 6/13 with pe  . History of kidney stones   . Tubulovillous adenoma polyp of colon     2010    Past Surgical History  Procedure Laterality Date  . Angioplasty  2001  . Back surgery      x2, lumbar and cervical  . Tonsillectomy    . Cataract extraction Bilateral 12/12  . Shoulder surgery Left 05/07/2009  . Cardiac catheterization    . Lumbar  laminectomy/decompression microdiscectomy Right 09/14/2012    Procedure: Right Lumbar three-four, four-five, Lumbar five-Sacral one decompressive laminectomy;  Surgeon: [name], MD;  Location: [facility];  Service: Neurosurgery;  Laterality: Right;    History   Social History  . Marital Status: Married    Spouse Name: N/A    Number of Children: 2  . Years of Education: 1-College   Occupational History  . retired     Retired   Social History Main Topics  . Smoking status: Never Smoker   . Smokeless tobacco: Never Used  . Alcohol Use: No  . Drug Use: No  . Sexual Activity: Not Currently   Other Topics Concern  . Not on file   Social History Narrative  . No narrative on file    Current Outpatient Prescriptions on File Prior to Visit  Medication Sig Dispense Refill  . esomeprazole (NEXIUM) 40 MG capsule Take 40 mg by mouth daily.      Marland Kitchen imatinib (GLEEVEC) 400 MG tablet Take 400 mg by mouth daily.       . isosorbide mononitrate (IMDUR) 30 MG 24 hr tablet Take 1 tablet (30 mg total) by mouth daily.  90 tablet  3  . loperamide (IMODIUM) 2 MG capsule Take 2 mg  by mouth every morning. For diarrhea      . LORazepam (ATIVAN) 0.5 MG tablet Take 1 tablet (0.5 mg total) by mouth at bedtime as needed.  30 tablet  1  . metFORMIN (GLUCOPHAGE-XR) 500 MG 24 hr tablet TAKE 1 TABLET BY MOUTH EVERY DAY IN THE MORNING  90 tablet  0  . metoprolol succinate (TOPROL-XL) 25 MG 24 hr tablet TAKE 1 TABLET EVERY DAY  90 tablet  1  . Multiple Vitamin (MULTIVITAMIN) tablet Take 1 tablet by mouth daily.      . Multiple Vitamins-Minerals (OCUVITE PO) Take 1 tablet by mouth daily.      . ondansetron (ZOFRAN) 4 MG tablet Take 4 mg by mouth every 6 (six) hours as needed.      . predniSONE (DELTASONE) 5 MG tablet Take 3 tablets (15 mg total) by mouth daily.  100 tablet  5  . pyridostigmine (MESTINON) 60 MG tablet Take 1 tablet (60 mg total) by mouth 3 (three) times daily.  90 tablet  5  . rosuvastatin  (CRESTOR) 10 MG tablet Take 5 mg by mouth every morning.   1 tablet  0  . terbinafine (LAMISIL) 250 MG tablet Take 250 mg by mouth daily.      . traMADol (ULTRAM) 50 MG tablet Take 1 tablet (50 mg total) by mouth every 4 (four) hours as needed for pain. For pain  60 tablet  1  . warfarin (COUMADIN) 5 MG tablet Take 5 mg by mouth See admin instructions. 5mg  on Mondays all other days take 2.5mg       . [DISCONTINUED] zolpidem (AMBIEN) 10 MG tablet Take 10 mg by mouth at bedtime as needed.        No current facility-administered medications on file prior to visit.    Allergies  Allergen Reactions  . Oxycodone-Acetaminophen Other (See Comments)    "too strong"  . Sulfamethoxazole Rash and Itching    Family History  Problem Relation Age of Onset  . Heart disease Mother   . Heart attack Mother   . Stroke Mother   . Heart disease Father   . Heart attack Father   . Stroke Father   . Breast cancer Sister     Twin   . Heart attack Sister   . Dementia Sister   . Heart disease Sister   . Heart attack Sister   . Clotting disorder Sister   . Heart attack Sister     BP 122/64  Pulse 88  Temp(Src) 98.1 F (36.7 C) (Oral)  Wt 182 lb (82.555 kg)  SpO2 96%   Review of Systems He denies hypoglycemia and weight change.    Objective:   Physical Exam VITAL SIGNS:  See vs page GENERAL: no distress Pulses: dorsalis pedis intact bilat.   Feet: no deformity.  no edema. There is bilateral onychomycosis and varicosities Skin:  no ulcer on the feet.  normal color and temp. Neuro: sensation is intact to touch on the feet, but decreased from normal.    Lab Results  Component Value Date   HGBA1C 6.0 10/23/2013   Lab Results  Component Value Date   CREATININE 1.3 10/23/2013   BUN 13 10/23/2013   NA 141 10/23/2013   K 4.1 10/23/2013   CL 106 10/23/2013   CO2 22 10/23/2013       Assessment & Plan:  DM: well-controlled Renal insuff: slightly improved MG: he needs prednisone for this, but  the prednisone seems to be having little effect  on glycemic control.  He is advised to not hesitate to take steroids if necessary.  Patient is advised the following: Patient Instructions  blood tests are being requested for you today.  We'll contact you with results. Here is a list of numbers you can call to have a meter and strips sent to your home. Please come back for a "medicare wellness" appointment in 6 months (after 04/25/14).

## 2013-10-24 ENCOUNTER — Ambulatory Visit: Payer: Medicare Other | Admitting: Physical Therapy

## 2013-10-25 ENCOUNTER — Ambulatory Visit (INDEPENDENT_AMBULATORY_CARE_PROVIDER_SITE_OTHER): Payer: Medicare Other | Admitting: *Deleted

## 2013-10-25 ENCOUNTER — Encounter: Payer: Self-pay | Admitting: Gastroenterology

## 2013-10-25 ENCOUNTER — Ambulatory Visit: Payer: Medicare Other | Admitting: Physical Therapy

## 2013-10-25 ENCOUNTER — Ambulatory Visit (INDEPENDENT_AMBULATORY_CARE_PROVIDER_SITE_OTHER): Payer: Medicare Other | Admitting: Gastroenterology

## 2013-10-25 VITALS — BP 114/64 | HR 64 | Ht 65.75 in | Wt 182.5 lb

## 2013-10-25 DIAGNOSIS — I2699 Other pulmonary embolism without acute cor pulmonale: Secondary | ICD-10-CM

## 2013-10-25 DIAGNOSIS — I2510 Atherosclerotic heart disease of native coronary artery without angina pectoris: Secondary | ICD-10-CM | POA: Diagnosis not present

## 2013-10-25 DIAGNOSIS — Z5181 Encounter for therapeutic drug level monitoring: Secondary | ICD-10-CM

## 2013-10-25 DIAGNOSIS — Z860100 Personal history of colon polyps, unspecified: Secondary | ICD-10-CM

## 2013-10-25 DIAGNOSIS — Z7901 Long term (current) use of anticoagulants: Secondary | ICD-10-CM | POA: Diagnosis not present

## 2013-10-25 DIAGNOSIS — Z5189 Encounter for other specified aftercare: Secondary | ICD-10-CM | POA: Diagnosis not present

## 2013-10-25 DIAGNOSIS — G7000 Myasthenia gravis without (acute) exacerbation: Secondary | ICD-10-CM

## 2013-10-25 DIAGNOSIS — R197 Diarrhea, unspecified: Secondary | ICD-10-CM

## 2013-10-25 LAB — POCT INR: INR: 3.1

## 2013-10-25 MED ORDER — MOVIPREP 100 G PO SOLR: 1.0000 | Freq: Once | ORAL | Status: DC

## 2013-10-25 NOTE — Assessment & Plan Note (Signed)
Symptoms are fairly well-controlled with Imodium.  She's having some breakthrough at night.  Plan to add one to 2 Imodium each bedtime

## 2013-10-25 NOTE — Addendum Note (Signed)
Addended by: Audrea Muscat on: 10/25/2013 03:21 PM   Modules accepted: Orders

## 2013-10-25 NOTE — Assessment & Plan Note (Signed)
Plan follow-up colonoscopy  The risk of holding anticoagulation therapy or antiplatelet medications was discussed including the increased risk for thromboembolic disease that may include DVT, pulmonary emboli and stroke. The patient understands this risk and is willing to proceed with temporally holding the medication provided that this is approved by her PCP or cardiologist.  

## 2013-10-25 NOTE — Progress Notes (Signed)
_                                                                                                                History of Present Illness:  [name] is a pleasant 73 year old white male with history of coronary artery disease, myasthenia gravis, pulmonary embolus, on Coumadin, here for followup colonoscopy.  Adenomatous polyps were removed in 2010.  On prior colonoscopy adenomas were also seen.  Patient's main complaint is occasional nocturnal diarrhea.  He takes Imodium 2 tabs every morning which generally controls his symptoms.  He takes prednisone chronically for myasthenia gravis.   Past Medical History  Diagnosis Date  . Coronary atherosclerosis of unspecified type of vessel, native or graft     remote cath in 2003 showing distal and branch vessel CAD with normal LV function; managed medically  . Mixed hyperlipidemia   . Glucose intolerance (impaired glucose tolerance)     history of  . Diverticulosis of colon (without mention of hemorrhage)   . Hx of colonic polyps   . Internal hemorrhoids without mention of complication   . Rash and other nonspecific skin eruption   . Pneumonia, organism unspecified   . Conjunctivitis unspecified   . Myasthenia gravis     uses prednisone  . GERD (gastroesophageal reflux disease)   . Pulmonary embolism June 2013  . DVT (deep venous thrombosis) June 2013  . Chronic anticoagulation   . Diabetes mellitus   . DJD (degenerative joint disease)   . Ptosis   . CML (chronic myeloid leukemia)   . HTN (hypertension)   . IBS (irritable bowel syndrome)   . History of pneumonia   . Dystonia   . High cholesterol   . Heart disease   . Cancer   . 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 PONV (postoperative nausea and vomiting)   . External hemorrhoids without mention of complication     blood clot left 6/13 with pe  . History of kidney stones   . Tubulovillous adenoma polyp of colon     2010   Past Surgical History    Procedure Laterality Date  . Angioplasty  2001  . Back surgery      x2, lumbar and cervical  . Tonsillectomy    . Cataract extraction Bilateral 12/12  . Shoulder surgery Left 05/07/2009  . Cardiac catheterization    . Lumbar laminectomy/decompression microdiscectomy Right 09/14/2012    Procedure: Right Lumbar three-four, four-five, Lumbar five-Sacral one decompressive laminectomy;  Surgeon: [name], MD;  Location: [facility];  Service: Neurosurgery;  Laterality: Right;   family history includes Breast cancer in his sister; Clotting disorder in his sister; Dementia in his sister; Heart attack in his father, mother, sister, sister, and sister; Heart disease in his father, mother, and sister; Stroke in his father and mother. Current Outpatient Prescriptions  Medication Sig Dispense Refill  . esomeprazole (NEXIUM) 40 MG capsule Take 40 mg by mouth daily.      Marland Kitchen imatinib (GLEEVEC)  400 MG tablet Take 400 mg by mouth daily.       . isosorbide mononitrate (IMDUR) 30 MG 24 hr tablet Take 1 tablet (30 mg total) by mouth daily.  90 tablet  3  . loperamide (IMODIUM) 2 MG capsule Take 2 mg by mouth every morning. For diarrhea      . LORazepam (ATIVAN) 0.5 MG tablet Take 1 tablet (0.5 mg total) by mouth at bedtime as needed.  30 tablet  1  . metFORMIN (GLUCOPHAGE-XR) 500 MG 24 hr tablet TAKE 1 TABLET BY MOUTH EVERY DAY IN THE MORNING  90 tablet  0  . metoprolol succinate (TOPROL-XL) 25 MG 24 hr tablet TAKE 1 TABLET EVERY DAY  90 tablet  1  . Multiple Vitamin (MULTIVITAMIN) tablet Take 1 tablet by mouth daily.      . Multiple Vitamins-Minerals (OCUVITE PO) Take 1 tablet by mouth daily.      . ondansetron (ZOFRAN) 4 MG tablet Take 4 mg by mouth every 6 (six) hours as needed.      . predniSONE (DELTASONE) 5 MG tablet Take 3 tablets (15 mg total) by mouth daily.  100 tablet  5  . pyridostigmine (MESTINON) 60 MG tablet Take 1 tablet (60 mg total) by mouth 3 (three) times daily.  90 tablet  5  .  rosuvastatin (CRESTOR) 10 MG tablet Take 5 mg by mouth every morning.   1 tablet  0  . traMADol (ULTRAM) 50 MG tablet Take 1 tablet (50 mg total) by mouth every 4 (four) hours as needed for pain. For pain  60 tablet  1  . warfarin (COUMADIN) 5 MG tablet Take 5 mg by mouth See admin instructions. 5mg  on Mondays all other days take 2.5mg       . [DISCONTINUED] zolpidem (AMBIEN) 10 MG tablet Take 10 mg by mouth at bedtime as needed.        No current facility-administered medications for this visit.   Allergies as of 10/25/2013 - Review Complete 10/25/2013  Allergen Reaction Noted  . Oxycodone-acetaminophen Other (See Comments) 04/24/2013  . Sulfamethoxazole Rash and Itching 07/12/2006    reports that he has never smoked. He has never used smokeless tobacco. He reports that he does not drink alcohol or use illicit drugs.   Review of Systems: He is having some difficulty with his vision which he attributes to a flare up of myasthenia gravis Pertinent positive and negative review of systems were noted in the above HPI section. All other review of systems were otherwise negative.  Vital signs were reviewed in today's medical record Physical Exam: General: Well developed , well nourished, no acute distress Skin: anicteric Head: Normocephalic and atraumatic Eyes:  sclerae anicteric, EOMI Ears: Normal auditory acuity Mouth: No deformity or lesions Neck: Supple, no masses or thyromegaly Lungs: Clear throughout to auscultation Heart: Regular rate and rhythm; no murmurs, rubs or bruits Abdomen: Soft, non tender and non distended. No masses, hepatosplenomegaly or hernias noted. Normal Bowel sounds Rectal:deferred Musculoskeletal: Symmetrical with no gross deformities  Skin: No lesions on visible extremities Pulses:  Normal pulses noted Extremities: No clubbing, cyanosis, edema or deformities noted Neurological: Alert oriented x 4, grossly nonfocal Cervical Nodes:  No significant cervical  adenopathy Inguinal Nodes: No significant inguinal adenopathy Psychological:  Alert and cooperative. Normal mood and affect  See Assessment and Plan under Problem List

## 2013-10-25 NOTE — Patient Instructions (Signed)
You have been scheduled for a colonoscopy. Please follow written instructions given to you at your visit today.  Please pick up your prep kit at the pharmacy within the next 1-3 days. If you use inhalers (even only as needed), please bring them with you on the day of your procedure.  

## 2013-10-26 ENCOUNTER — Ambulatory Visit: Payer: Medicare Other | Admitting: Physical Therapy

## 2013-10-27 ENCOUNTER — Telehealth: Payer: Self-pay

## 2013-10-27 DIAGNOSIS — C9211 Chronic myeloid leukemia, BCR/ABL-positive, in remission: Secondary | ICD-10-CM | POA: Diagnosis not present

## 2013-10-27 NOTE — Telephone Encounter (Signed)
10/27/2013   RE: CHINEDUM VANHOUTEN DOB: Oct 06, 1940 MRN: [id number]   Dear Burt Knack,    We have scheduled the above patient for an endoscopic procedure. Our records show that he is on anticoagulation therapy.   Please advise as to how long the patient may come off his therapy of Coumadin prior to the procedure, which is scheduled for 12/26/2013.  Please fax back/ or route the completed form to [facility] at [phone].   Sincerely,    Phillis Haggis

## 2013-10-30 NOTE — Telephone Encounter (Signed)
Hold warfarin 5 days prior to colonoscopy, resume when OK with Dr Deatra Ina. thx

## 2013-10-31 ENCOUNTER — Ambulatory Visit: Payer: Medicare Other

## 2013-10-31 DIAGNOSIS — Z5189 Encounter for other specified aftercare: Secondary | ICD-10-CM | POA: Diagnosis not present

## 2013-10-31 NOTE — Telephone Encounter (Signed)
Spoke with patient and let him know that per Dr. Burt Knack, he could hold his Coumadin for 5 days prior to procedure.  Patient acknolwedged and understood.

## 2013-11-03 ENCOUNTER — Ambulatory Visit: Payer: Medicare Other

## 2013-11-03 DIAGNOSIS — Z5189 Encounter for other specified aftercare: Secondary | ICD-10-CM | POA: Diagnosis not present

## 2013-11-06 ENCOUNTER — Ambulatory Visit (INDEPENDENT_AMBULATORY_CARE_PROVIDER_SITE_OTHER): Payer: Medicare Other | Admitting: *Deleted

## 2013-11-06 ENCOUNTER — Ambulatory Visit: Payer: Medicare Other

## 2013-11-06 DIAGNOSIS — Z7901 Long term (current) use of anticoagulants: Secondary | ICD-10-CM | POA: Diagnosis not present

## 2013-11-06 DIAGNOSIS — Z5181 Encounter for therapeutic drug level monitoring: Secondary | ICD-10-CM | POA: Diagnosis not present

## 2013-11-06 DIAGNOSIS — Z5189 Encounter for other specified aftercare: Secondary | ICD-10-CM | POA: Diagnosis not present

## 2013-11-06 DIAGNOSIS — I2699 Other pulmonary embolism without acute cor pulmonale: Secondary | ICD-10-CM | POA: Diagnosis not present

## 2013-11-06 LAB — POCT INR: INR: 3

## 2013-11-10 ENCOUNTER — Ambulatory Visit: Payer: Medicare Other | Attending: Neurology

## 2013-11-10 DIAGNOSIS — M2560 Stiffness of unspecified joint, not elsewhere classified: Secondary | ICD-10-CM | POA: Diagnosis not present

## 2013-11-10 DIAGNOSIS — M5416 Radiculopathy, lumbar region: Secondary | ICD-10-CM | POA: Diagnosis present

## 2013-11-10 DIAGNOSIS — G7000 Myasthenia gravis without (acute) exacerbation: Secondary | ICD-10-CM | POA: Diagnosis present

## 2013-11-10 DIAGNOSIS — M5412 Radiculopathy, cervical region: Secondary | ICD-10-CM | POA: Diagnosis present

## 2013-11-13 ENCOUNTER — Ambulatory Visit: Payer: Medicare Other | Admitting: Physical Therapy

## 2013-11-13 DIAGNOSIS — M2560 Stiffness of unspecified joint, not elsewhere classified: Secondary | ICD-10-CM | POA: Diagnosis not present

## 2013-11-17 ENCOUNTER — Ambulatory Visit: Payer: Medicare Other

## 2013-11-17 DIAGNOSIS — M2560 Stiffness of unspecified joint, not elsewhere classified: Secondary | ICD-10-CM | POA: Diagnosis not present

## 2013-11-20 ENCOUNTER — Ambulatory Visit: Payer: Medicare Other

## 2013-11-20 ENCOUNTER — Ambulatory Visit (INDEPENDENT_AMBULATORY_CARE_PROVIDER_SITE_OTHER): Payer: Medicare Other

## 2013-11-20 DIAGNOSIS — Z7901 Long term (current) use of anticoagulants: Secondary | ICD-10-CM

## 2013-11-20 DIAGNOSIS — I2699 Other pulmonary embolism without acute cor pulmonale: Secondary | ICD-10-CM | POA: Diagnosis not present

## 2013-11-20 DIAGNOSIS — Z5181 Encounter for therapeutic drug level monitoring: Secondary | ICD-10-CM | POA: Diagnosis not present

## 2013-11-20 DIAGNOSIS — M2560 Stiffness of unspecified joint, not elsewhere classified: Secondary | ICD-10-CM | POA: Diagnosis not present

## 2013-11-20 LAB — POCT INR: INR: 2

## 2013-11-23 ENCOUNTER — Ambulatory Visit: Payer: Medicare Other

## 2013-11-23 DIAGNOSIS — M2560 Stiffness of unspecified joint, not elsewhere classified: Secondary | ICD-10-CM | POA: Diagnosis not present

## 2013-12-04 ENCOUNTER — Ambulatory Visit: Payer: Medicare Other

## 2013-12-04 ENCOUNTER — Ambulatory Visit (INDEPENDENT_AMBULATORY_CARE_PROVIDER_SITE_OTHER): Payer: Medicare Other | Admitting: Endocrinology

## 2013-12-04 ENCOUNTER — Encounter: Payer: Self-pay | Admitting: Endocrinology

## 2013-12-04 VITALS — BP 126/84 | HR 71 | Temp 98.4°F | Ht 65.75 in | Wt 183.0 lb

## 2013-12-04 DIAGNOSIS — R51 Headache: Secondary | ICD-10-CM

## 2013-12-04 DIAGNOSIS — M2560 Stiffness of unspecified joint, not elsewhere classified: Secondary | ICD-10-CM | POA: Diagnosis not present

## 2013-12-04 DIAGNOSIS — I2510 Atherosclerotic heart disease of native coronary artery without angina pectoris: Secondary | ICD-10-CM | POA: Diagnosis not present

## 2013-12-04 DIAGNOSIS — R519 Headache, unspecified: Secondary | ICD-10-CM | POA: Insufficient documentation

## 2013-12-04 MED ORDER — CEFUROXIME AXETIL 250 MG PO TABS: 250.0000 mg | ORAL_TABLET | Freq: Two times a day (BID) | ORAL | Status: AC

## 2013-12-04 NOTE — Progress Notes (Signed)
Subjective:    Patient ID: [name], male    DOB: 1940/06/28, 73 y.o.   MRN: [id number]  HPI Pt states 3 weeks of slight pain behind the right eye, and assoc redness of both eyes.  He has nasal congestion also.   Past Medical History  Diagnosis Date  . Coronary atherosclerosis of unspecified type of vessel, native or graft     remote cath in 2003 showing distal and branch vessel CAD with normal LV function; managed medically  . Mixed hyperlipidemia   . Glucose intolerance (impaired glucose tolerance)     history of  . Diverticulosis of colon (without mention of hemorrhage)   . Hx of colonic polyps   . Internal hemorrhoids without mention of complication   . Rash and other nonspecific skin eruption   . Pneumonia, organism unspecified   . Conjunctivitis unspecified   . Myasthenia gravis     uses prednisone  . GERD (gastroesophageal reflux disease)   . Pulmonary embolism June 2013  . DVT (deep venous thrombosis) June 2013  . Chronic anticoagulation   . Diabetes mellitus   . DJD (degenerative joint disease)   . Ptosis   . CML (chronic myeloid leukemia)   . HTN (hypertension)   . IBS (irritable bowel syndrome)   . History of pneumonia   . Dystonia   . High cholesterol   . Heart disease   . Cancer   . 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 PONV (postoperative nausea and vomiting)   . External hemorrhoids without mention of complication     blood clot left 6/13 with pe  . History of kidney stones   . Tubulovillous adenoma polyp of colon     2010    Past Surgical History  Procedure Laterality Date  . Angioplasty  2001  . Back surgery      x2, lumbar and cervical  . Tonsillectomy    . Cataract extraction Bilateral 12/12  . Shoulder surgery Left 05/07/2009  . Cardiac catheterization    . Lumbar laminectomy/decompression microdiscectomy Right 09/14/2012    Procedure: Right Lumbar three-four, four-five, Lumbar five-Sacral one decompressive laminectomy;  Surgeon:  [name], MD;  Location: [facility];  Service: Neurosurgery;  Laterality: Right;    History   Social History  . Marital Status: Married    Spouse Name: N/A    Number of Children: 2  . Years of Education: 1-College   Occupational History  . retired     Retired   Social History Main Topics  . Smoking status: Never Smoker   . Smokeless tobacco: Never Used  . Alcohol Use: No  . Drug Use: No  . Sexual Activity: Not Currently   Other Topics Concern  . Not on file   Social History Narrative  . No narrative on file    Current Outpatient Prescriptions on File Prior to Visit  Medication Sig Dispense Refill  . esomeprazole (NEXIUM) 40 MG capsule Take 40 mg by mouth daily.      Marland Kitchen imatinib (GLEEVEC) 400 MG tablet Take 400 mg by mouth daily.       . isosorbide mononitrate (IMDUR) 30 MG 24 hr tablet Take 1 tablet (30 mg total) by mouth daily.  90 tablet  3  . loperamide (IMODIUM) 2 MG capsule Take 2 mg by mouth every morning. For diarrhea      . LORazepam (ATIVAN) 0.5 MG tablet Take 1 tablet (0.5 mg total) by mouth at bedtime  as needed.  30 tablet  1  . metFORMIN (GLUCOPHAGE-XR) 500 MG 24 hr tablet TAKE 1 TABLET BY MOUTH EVERY DAY IN THE MORNING  90 tablet  0  . metoprolol succinate (TOPROL-XL) 25 MG 24 hr tablet TAKE 1 TABLET EVERY DAY  90 tablet  1  . MOVIPREP 100 G SOLR Take 1 kit (200 g total) by mouth once.  1 kit  0  . Multiple Vitamin (MULTIVITAMIN) tablet Take 1 tablet by mouth daily.      . Multiple Vitamins-Minerals (OCUVITE PO) Take 1 tablet by mouth daily.      . ondansetron (ZOFRAN) 4 MG tablet Take 4 mg by mouth every 6 (six) hours as needed.      . predniSONE (DELTASONE) 5 MG tablet Take 3 tablets (15 mg total) by mouth daily.  100 tablet  5  . pyridostigmine (MESTINON) 60 MG tablet Take 1 tablet (60 mg total) by mouth 3 (three) times daily.  90 tablet  5  . rosuvastatin (CRESTOR) 10 MG tablet Take 5 mg by mouth every morning.   1 tablet  0  . traMADol (ULTRAM) 50  MG tablet Take 1 tablet (50 mg total) by mouth every 4 (four) hours as needed for pain. For pain  60 tablet  1  . warfarin (COUMADIN) 5 MG tablet Take 5 mg by mouth See admin instructions. 42m on Mondays all other days take 2.562m     . [DISCONTINUED] zolpidem (AMBIEN) 10 MG tablet Take 10 mg by mouth at bedtime as needed.        No current facility-administered medications on file prior to visit.    Allergies  Allergen Reactions  . Oxycodone-Acetaminophen Other (See Comments)    "too strong"  . Sulfamethoxazole Rash and Itching    Family History  Problem Relation Age of Onset  . Heart disease Mother   . Heart attack Mother   . Stroke Mother   . Heart disease Father   . Heart attack Father   . Stroke Father   . Breast cancer Sister     Twin   . Heart attack Sister   . Dementia Sister   . Heart disease Sister   . Heart attack Sister   . Clotting disorder Sister   . Heart attack Sister     BP 126/84  Pulse 71  Temp(Src) 98.4 F (36.9 C) (Oral)  Ht 5' 5.75" (1.67 m)  Wt 183 lb (83.008 kg)  BMI 29.76 kg/m2  SpO2 97%    Review of Systems Denies visual loss.      Objective:   Physical Exam VITAL SIGNS:  See vs page GENERAL: no distress Face: swollen in general. head: no deformity eyes: slight bilateral periorbital swelling, no proptosis external nose and ears are normal mouth: no lesion seen Both eac's and tm's are normal.        Assessment & Plan:  Eye sxs, new to me: he has several possible causes.  However, allergic rhinitis is most likely.  Patient is advised the following: Patient Instructions  i have sent a prescription to your pharmacy, for an antibiotic pill.   Loratadine-d (non-prescription) will help your congestion.    I hope you feel better soon.  If you don't feel better in 2-3 days, please call back.  Please call sooner if you get worse.

## 2013-12-04 NOTE — Patient Instructions (Addendum)
i have sent a prescription to your pharmacy, for an antibiotic pill.   Loratadine-d (non-prescription) will help your congestion.    I hope you feel better soon.  If you don't feel better in 2-3 days, please call back.  Please call sooner if you get worse.

## 2013-12-06 ENCOUNTER — Ambulatory Visit: Payer: Medicare Other | Admitting: Neurology

## 2013-12-07 ENCOUNTER — Encounter: Payer: Self-pay | Admitting: Endocrinology

## 2013-12-08 ENCOUNTER — Ambulatory Visit: Payer: Medicare Other

## 2013-12-14 DIAGNOSIS — H524 Presbyopia: Secondary | ICD-10-CM | POA: Diagnosis not present

## 2013-12-14 DIAGNOSIS — H26492 Other secondary cataract, left eye: Secondary | ICD-10-CM | POA: Diagnosis not present

## 2013-12-14 DIAGNOSIS — G7000 Myasthenia gravis without (acute) exacerbation: Secondary | ICD-10-CM | POA: Diagnosis not present

## 2013-12-14 DIAGNOSIS — H04123 Dry eye syndrome of bilateral lacrimal glands: Secondary | ICD-10-CM | POA: Diagnosis not present

## 2013-12-14 DIAGNOSIS — H0520 Unspecified exophthalmos: Secondary | ICD-10-CM | POA: Diagnosis not present

## 2013-12-15 ENCOUNTER — Ambulatory Visit: Payer: Medicare Other | Attending: Neurology

## 2013-12-15 ENCOUNTER — Ambulatory Visit (INDEPENDENT_AMBULATORY_CARE_PROVIDER_SITE_OTHER): Payer: Medicare Other | Admitting: Pharmacist

## 2013-12-15 DIAGNOSIS — Z7901 Long term (current) use of anticoagulants: Secondary | ICD-10-CM

## 2013-12-15 DIAGNOSIS — M5416 Radiculopathy, lumbar region: Secondary | ICD-10-CM | POA: Diagnosis present

## 2013-12-15 DIAGNOSIS — M2560 Stiffness of unspecified joint, not elsewhere classified: Secondary | ICD-10-CM

## 2013-12-15 DIAGNOSIS — M5412 Radiculopathy, cervical region: Secondary | ICD-10-CM | POA: Diagnosis present

## 2013-12-15 DIAGNOSIS — I82402 Acute embolism and thrombosis of unspecified deep veins of left lower extremity: Secondary | ICD-10-CM | POA: Diagnosis not present

## 2013-12-15 DIAGNOSIS — I2699 Other pulmonary embolism without acute cor pulmonale: Secondary | ICD-10-CM

## 2013-12-15 DIAGNOSIS — Z5181 Encounter for therapeutic drug level monitoring: Secondary | ICD-10-CM | POA: Diagnosis not present

## 2013-12-15 DIAGNOSIS — G7000 Myasthenia gravis without (acute) exacerbation: Secondary | ICD-10-CM | POA: Diagnosis present

## 2013-12-15 LAB — POCT INR: INR: 3.1

## 2013-12-15 NOTE — Therapy (Signed)
Physical Therapy Treatment  Patient Details  Name: [name] MRN: [id number] Date of Birth: [birth year]  Encounter Date: 12/15/2013      PT End of Session - 12/15/13 0908    Visit Number 16   Date for PT Re-Evaluation 12/29/13   PT Start Time 0802   PT Stop Time 0850   PT Time Calculation (min) 48 min   Activity Tolerance Patient tolerated treatment well      Past Medical History  Diagnosis Date  . Coronary atherosclerosis of unspecified type of vessel, native or graft     remote cath in 2003 showing distal and branch vessel CAD with normal LV function; managed medically  . Mixed hyperlipidemia   . Glucose intolerance (impaired glucose tolerance)     history of  . Diverticulosis of colon (without mention of hemorrhage)   . Hx of colonic polyps   . Internal hemorrhoids without mention of complication   . Rash and other nonspecific skin eruption   . Pneumonia, organism unspecified   . Conjunctivitis unspecified   . Myasthenia gravis     uses prednisone  . GERD (gastroesophageal reflux disease)   . Pulmonary embolism June 2013  . DVT (deep venous thrombosis) June 2013  . Chronic anticoagulation   . Diabetes mellitus   . DJD (degenerative joint disease)   . Ptosis   . CML (chronic myeloid leukemia)   . HTN (hypertension)   . IBS (irritable bowel syndrome)   . History of pneumonia   . Dystonia   . High cholesterol   . Heart disease   . Cancer   . 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 PONV (postoperative nausea and vomiting)   . External hemorrhoids without mention of complication     blood clot left 6/13 with pe  . History of kidney stones   . Tubulovillous adenoma polyp of colon     2010    Past Surgical History  Procedure Laterality Date  . Angioplasty  2001  . Back surgery      x2, lumbar and cervical  . Tonsillectomy    . Cataract extraction Bilateral 12/12  . Shoulder surgery Left 05/07/2009  . Cardiac catheterization    . Lumbar  laminectomy/decompression microdiscectomy Right 09/14/2012    Procedure: Right Lumbar three-four, four-five, Lumbar five-Sacral one decompressive laminectomy;  Surgeon: [name], MD;  Location: [facility];  Service: Neurosurgery;  Laterality: Right;    There were no vitals taken for this visit.  Visit Diagnosis:  Joint stiffness of multiple sites          [facility] Adult PT Treatment/Exercise - 12/15/13 0832    Exercises   Exercises Lumbar;Knee/Hip   Lumbar Exercises: Stretches   Active Hamstring Stretch 3 reps;30 seconds  seated   Press Ups 5 reps;10 seconds   Lumbar Exercises: Aerobic   Tread Mill endurance training on treadmill 1% grade without UE support x5 minutes at 2 mph   Lumbar Exercises: Seated   Other Seated Lumbar Exercises 3x8 bilateral simultaneous hip flexion   Other Seated Lumbar Exercises Stretches: 3x30 seconds seated hip internal rotator stretch   Lumbar Exercises: Supine   Straight Leg Raise --  3x8 bilateral simultaneous   Lumbar Exercises: Prone   Other Prone Lumbar Exercises spinal extension (thoroacic and lumbar) 3x10, heel squeeze bilateral leg lifts 3x8          Education - 12/15/13 0902    Education provided Yes   Education Details see patient  instructions   Education Details Patient   Methods Explanation;Demonstration   Comprehension Verbalized understanding;Returned demonstration          PT Short Term Goals - 12/15/13 0913    PT SHORT TERM GOAL #1   Title Will report ability to walk one floor at the mall 2-3x per week for 1 week for decreased pain and improved wellness.   Time 14   Period Days   Status On-going   PT SHORT TERM GOAL #2   Title Will report increased ease with donning socks due to increased lower extremity flexibility    Time 14   Period Days   Status On-going   PT SHORT TERM GOAL #3   Title will decrease score on Oswestry Low Back Pain questionaire to 8 points (16% disability)   Time 14   Period Days   Status  On-going   PT SHORT TERM GOAL #4   Title FOTO: Increase functional status by 5 points for improved quality of life   Time 14   Period Days   Status On-going   PT SHORT TERM GOAL #5   Title Will demonstrate ability to perform a golf swing through full range with pain increase no greater than 1 point on 10 point scale for readiness to return to modified golf playing   Time 14   Period Days   Status On-going   Additional Short Term Goals   Additional Short Term Goals Yes   PT SHORT TERM GOAL #6   Title Will report a decrease in sharp pain with bed mobility to no greater than 1x per week for improved rest and decreased pain.   Time 14   Period Days   Status On-going            Plan - 12/15/13 0910    Clinical Impression Statement Pt is making progress with decreased sharp pain occurring with bed mobility. No sharp pain with prone to supine, or prone to sit or sit to supine today. His walking tolerance is improving aa his his golf swing range of mtion. Pt  willrequirested to cancel next week's visit due to being out of town. Pt is expected to meet most of his goals next visit. He asked for more explanation/for better understanding of Oswestry as he feels that it is not capturing his improvement.   PT Plan G-CODE* and D/C on 12/25/13. Check all goals. Update HEP. Explain Oswestry prior to administration.        Problem List Patient Active Problem List   Diagnosis Date Noted  . Headache 12/04/2013  . Routine general medical examination at a health care facility 10/23/2013  . Upper airway cough syndrome 06/13/2013  . Bilateral carotid bruits 05/24/2013  . Abnormal CT scan, lung 05/24/2013  . Coronary atherosclerosis of native coronary artery 05/15/2013  . Anterior chest wall pain 05/11/2013  . Encounter for therapeutic drug monitoring 03/14/2013  . Lymphadenopathy, inguinal 01/11/2013  . Dehydration 06/07/2012  . Subtherapeutic international normalized ratio (INR) 06/07/2012  .  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 Myasthenia gravis with exacerbation 03/31/2012  . Facial pain 10/28/2011  . Myasthenia gravis 09/23/2011  . GERD with stricture 09/23/2011  . Encounter for long-term (current) use of anticoagulants 08/03/2011  . Pulmonary embolism 07/30/2011  . DVT of lower extremity (deep venous thrombosis) 07/30/2011  . Subconjunctival hemorrhage 06/22/2011  . Acute bronchitis 04/09/2011  . Bilateral conjunctivitis 04/09/2011  . Type II or unspecified type diabetes mellitus without mention of complication,  not stated as uncontrolled 12/18/2010  . Hearing loss 08/04/2010  . THRUSH 04/04/2010  . PTOSIS 12/30/2009  . HYPERTENSION 04/24/2009  . HYPERLIPIDEMIA, MIXED 10/05/2008  . HEMORRHOIDS-INTERNAL 07/02/2008  . HEMORRHOIDS-EXTERNAL 07/02/2008  . DIVERTICULOSIS-COLON 07/02/2008  . PERSONAL HX COLONIC POLYPS 07/02/2008  . RASH-NONVESICULAR 02/07/2008  . PNEUMONIA 12/08/2007  . LEUKEMIA, MYELOID, CHRONIC 07/07/2007  . CORONARY ARTERY DISEASE 07/07/2007  . NEPHROLITHIASIS, HX OF 07/07/2007                                            Delrae Sawyers D 12/15/2013, 9:23 AM

## 2013-12-15 NOTE — Patient Instructions (Signed)
Stretching: 1. Instead of performing sock stretch laying down, perform in same manner, but seated. Every day. 2. Walk at least 1 floor of the mall 2x this week for progress toward regular walking exercise  *Next (last) visit is Monday, November 16 at 8:45am

## 2013-12-17 ENCOUNTER — Other Ambulatory Visit: Payer: Self-pay | Admitting: Cardiovascular Disease

## 2013-12-17 ENCOUNTER — Other Ambulatory Visit: Payer: Self-pay | Admitting: Neurology

## 2013-12-18 ENCOUNTER — Ambulatory Visit: Payer: Medicare Other

## 2013-12-19 ENCOUNTER — Telehealth: Payer: Self-pay

## 2013-12-19 NOTE — Telephone Encounter (Signed)
Per patient's previous request, called him to remind him to stop his blood thinner on 11/12 - 5 days prior to procedure.  He acknowledged and understood.

## 2013-12-19 NOTE — Telephone Encounter (Signed)
-----   Message from Audrea Muscat, [place] sent at 10/31/2013  7:47 AM EDT ----- Call patient to remind him to hold his coumadin

## 2013-12-25 ENCOUNTER — Ambulatory Visit: Payer: Medicare Other

## 2013-12-25 DIAGNOSIS — M2560 Stiffness of unspecified joint, not elsewhere classified: Secondary | ICD-10-CM

## 2013-12-25 NOTE — Therapy (Signed)
Physical Therapy Treatment  Patient Details  Name: [name] MRN: [id number] Date of Birth: [birth year]  Encounter Date: 12/25/2013      PT End of Session - 12/25/13 0945    Visit Number 70   PT Start Time 3888   PT Stop Time 0931   PT Time Calculation (min) 38 min      Past Medical History  Diagnosis Date  . Coronary atherosclerosis of unspecified type of vessel, native or graft     remote cath in 2003 showing distal and branch vessel CAD with normal LV function; managed medically  . Mixed hyperlipidemia   . Glucose intolerance (impaired glucose tolerance)     history of  . Diverticulosis of colon (without mention of hemorrhage)   . Hx of colonic polyps   . Internal hemorrhoids without mention of complication   . Rash and other nonspecific skin eruption   . Pneumonia, organism unspecified   . Conjunctivitis unspecified   . Myasthenia gravis     uses prednisone  . GERD (gastroesophageal reflux disease)   . Pulmonary embolism June 2013  . DVT (deep venous thrombosis) June 2013  . Chronic anticoagulation   . Diabetes mellitus   . DJD (degenerative joint disease)   . Ptosis   . CML (chronic myeloid leukemia)   . HTN (hypertension)   . IBS (irritable bowel syndrome)   . History of pneumonia   . Dystonia   . High cholesterol   . Heart disease   . Cancer   . 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 PONV (postoperative nausea and vomiting)   . External hemorrhoids without mention of complication     blood clot left 6/13 with pe  . History of kidney stones   . Tubulovillous adenoma polyp of colon     2010    Past Surgical History  Procedure Laterality Date  . Angioplasty  2001  . Back surgery      x2, lumbar and cervical  . Tonsillectomy    . Cataract extraction Bilateral 12/12  . Shoulder surgery Left 05/07/2009  . Cardiac catheterization    . Lumbar laminectomy/decompression microdiscectomy Right 09/14/2012    Procedure: Right Lumbar three-four,  four-five, Lumbar five-Sacral one decompressive laminectomy;  Surgeon: [name], MD;  Location: [facility];  Service: Neurosurgery;  Laterality: Right;    There were no vitals taken for this visit.  Visit Diagnosis:  Joint stiffness of multiple sites      Subjective Assessment - 12/25/13 0859    Symptoms Pt reports a blood vessel in his eye burst a couple weeks ago, believes it is due to the coumadin and this has been managed by an ophthamologist. He is feeling ready for discharge todya.   Currently in Pain? No/denies      Theract: Checked all therapy goals and discussed progress:  -Pt reports he would be able to ambulate all 3 floors at the mall 2-3 days per week from a physical standpoint, but that he has not made it a priority because he has been busy. He did ambulate all 3 floors at the mall last Thursday after therapy, after walking on the treadmill in therapy.  -FOTO functional status score increased from intake of 56 to d/c of 66; discussed FOTO purpose and results with patient and he verbalized understanding -Oswestry Low Back Pain Questionaire: Pt improved score to 8 points = 16% disability.  -Pt demonstrated ease of donning socks due to increased hip external rotation  AROM -Pt performs bed mobility without sharp pain and reports that he can't remember the last time the sharp pain occurred with bed mobility. -Pt able to perform full golf swing with a 1 point increase in pain. Performed >20 reps of this. -Pt is independent with home exercise program and feels it is beneficial and asked that we do not add more exercises today.           PT Short Term Goals - January 01, 2014 0902    PT SHORT TERM GOAL #1   Title Will report ability to walk one floor at the mall 2-3x per week for 1 week for decreased pain and improved wellness.   Time 0   Period Days   Status Partially Met   PT SHORT TERM GOAL #2   Title Will report increased ease with donning socks due to increased lower  extremity flexibility    Time 0   Period Days   Status Achieved   PT SHORT TERM GOAL #3   Title will decrease score on Oswestry Low Back Pain questionaire to 8 points (16% disability)   Time --   Period --   Status Achieved   PT SHORT TERM GOAL #4   Title FOTO: Increase functional status by 5 points for improved quality of life   Time --   Period --   Status Achieved   PT SHORT TERM GOAL #5   Title Will demonstrate ability to perform a golf swing through full range with pain increase no greater than 1 point on 10 point scale for readiness to return to modified golf playing   Time --   Period --   Status Achieved   PT SHORT TERM GOAL #6   Title Will report a decrease in sharp pain with bed mobility to no greater than 1x per week for improved rest and decreased pain.   Time --   Period --   Status Achieved            Plan - 01/01/14 0947    Clinical Impression Statement Pt has made significant progress toward therapy goals. He continues to have dull surgical site pain, but this is chronic in nature and may not be correctible with physical therapy. Pt's acute , sharp pain as nearly resolved. He is now able to perform bed mobility, golf swings, don socks and other mobility tasks without onset of sharp pain.  Pt is ready for discharge.   PT Plan D/C today          G-Codes - Jan 01, 2014 0951    Functional Assessment Tool Used Oswestry Low Back Pain Questionaire   Functional Limitation Mobility: Walking and moving around   Mobility: Walking and Moving Around Goal Status [phone]) At least 1 percent but less than 20 percent impaired, limited or restricted   Mobility: Walking and Moving Around Discharge Status [id number]) At least 1 percent but less than 20 percent impaired, limited or restricted      Problem List Patient Active Problem List   Diagnosis Date Noted  . Headache 12/04/2013  . Routine general medical examination at a health care facility 10/23/2013  . Upper airway cough  syndrome 06/13/2013  . Bilateral carotid bruits 05/24/2013  . Abnormal CT scan, lung 05/24/2013  . Coronary atherosclerosis of native coronary artery 05/15/2013  . Anterior chest wall pain 05/11/2013  . Encounter for therapeutic drug monitoring 03/14/2013  . Lymphadenopathy, inguinal 01/11/2013  . Dehydration 06/07/2012  . Subtherapeutic international normalized ratio (INR) 06/07/2012  .  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 Myasthenia gravis with exacerbation 03/31/2012  . Facial pain 10/28/2011  . Myasthenia gravis 09/23/2011  . GERD with stricture 09/23/2011  . Encounter for long-term (current) use of anticoagulants 08/03/2011  . Pulmonary embolism 07/30/2011  . DVT of lower extremity (deep venous thrombosis) 07/30/2011  . Subconjunctival hemorrhage 06/22/2011  . Acute bronchitis 04/09/2011  . Bilateral conjunctivitis 04/09/2011  . Type II or unspecified type diabetes mellitus without mention of complication, not stated as uncontrolled 12/18/2010  . Hearing loss 08/04/2010  . THRUSH 04/04/2010  . PTOSIS 12/30/2009  . HYPERTENSION 04/24/2009  . HYPERLIPIDEMIA, MIXED 10/05/2008  . HEMORRHOIDS-INTERNAL 07/02/2008  . HEMORRHOIDS-EXTERNAL 07/02/2008  . DIVERTICULOSIS-COLON 07/02/2008  . PERSONAL HX COLONIC POLYPS 07/02/2008  . RASH-NONVESICULAR 02/07/2008  . PNEUMONIA 12/08/2007  . LEUKEMIA, MYELOID, CHRONIC 07/07/2007  . CORONARY ARTERY DISEASE 07/07/2007  . NEPHROLITHIASIS, HX OF 07/07/2007     PHYSICAL THERAPY DISCHARGE SUMMARY  Visits from Start of Care: 17  Current functional level related to goals / functional outcomes: See goals above.   Remaining deficits: Chronic incision site dull pain in lumbar spine, mild balance impairment with higher level balance tasks, weak core stabilizers although this is improving.   Education / Equipment: Home exercise program, fall prevention education, concepts of importance of good core stability to decrease sharp lumbar  and thoracic pain.  Plan: Patient agrees to discharge.  Patient goals were met. Patient is being discharged due to meeting the stated rehab goals.  ?????                                             Nakyra Bourn, Anderson Malta D 12/25/2013, 10:00 AM

## 2013-12-26 ENCOUNTER — Other Ambulatory Visit: Payer: Self-pay | Admitting: Cardiovascular Disease

## 2013-12-26 ENCOUNTER — Ambulatory Visit (AMBULATORY_SURGERY_CENTER): Payer: Medicare Other | Admitting: Gastroenterology

## 2013-12-26 ENCOUNTER — Encounter: Payer: Self-pay | Admitting: Gastroenterology

## 2013-12-26 VITALS — BP 150/78 | HR 58 | Temp 97.3°F | Resp 17 | Ht 65.0 in | Wt 182.0 lb

## 2013-12-26 DIAGNOSIS — Z86010 Personal history of colonic polyps: Secondary | ICD-10-CM | POA: Diagnosis not present

## 2013-12-26 DIAGNOSIS — K5730 Diverticulosis of large intestine without perforation or abscess without bleeding: Secondary | ICD-10-CM

## 2013-12-26 DIAGNOSIS — E119 Type 2 diabetes mellitus without complications: Secondary | ICD-10-CM | POA: Diagnosis not present

## 2013-12-26 DIAGNOSIS — I10 Essential (primary) hypertension: Secondary | ICD-10-CM | POA: Diagnosis not present

## 2013-12-26 LAB — GLUCOSE, CAPILLARY
GLUCOSE-CAPILLARY: 78 mg/dL (ref 70–99)
Glucose-Capillary: 173 mg/dL — ABNORMAL HIGH (ref 70–99)

## 2013-12-26 MED ORDER — SODIUM CHLORIDE 0.9 % IV SOLN: 500.0000 mL | INTRAVENOUS | Status: DC

## 2013-12-26 NOTE — Progress Notes (Signed)
Patient's blood sugar- 78. Patient asymptomatic. Iv started at 11:17 with D5W.

## 2013-12-26 NOTE — Op Note (Signed)
[facility]  [address] [place], [id number]   COLONOSCOPY PROCEDURE REPORT  PATIENT: [name], [name]  MR#: [id number] BIRTHDATE: [birth year] , 78  yrs. old GENDER: male ENDOSCOPIST: Inda Castle, MD REFERRED RF:FMBW Rupert Stacks, M.D. PROCEDURE DATE:  12/26/2013 PROCEDURE:   Colonoscopy, diagnostic First Screening Colonoscopy - Avg.  risk and is 50 yrs.  old or older - No.  Prior Negative Screening - Now for repeat screening. N/A  History of Adenoma - Now for follow-up colonoscopy & has been > or = to 3 yrs.  Yes hx of adenoma.  Has been 3 or more years since last colonoscopy.  Polyps Removed Today? No.  Recommend repeat exam, <10 yrs? No. ASA CLASS:   Class II INDICATIONS:high risk personal history of colonic polyps.2010 MEDICATIONS: Monitored anesthesia care and Propofol 200 mg IV  DESCRIPTION OF PROCEDURE:   After the risks benefits and alternatives of the procedure were thoroughly explained, informed consent was obtained.  The digital rectal exam revealed no abnormalities of the rectum.   The LB [id number]  endoscope was introduced through the anus and advanced to the cecum, which was identified by both the appendix and ileocecal valve. No adverse events experienced.   The quality of the prep was excellent using Suprep  The instrument was then slowly withdrawn as the colon was fully examined.      COLON FINDINGS: There was moderate diverticulosis noted in the sigmoid colon with associated muscular hypertrophy.   The examination was otherwise normal.  Retroflexed views revealed no abnormalities. The time to cecum=3 minutes 22 seconds.  Withdrawal time=7 minutes 30 seconds.  The scope was withdrawn and the procedure completed. COMPLICATIONS: There were no immediate complications.  ENDOSCOPIC IMPRESSION: 1.   There was moderate diverticulosis noted in the sigmoid colon 2.   The examination was otherwise normal  RECOMMENDATIONS: Given your age,  you will not need another colonoscopy for colon cancer screening or polyp surveillance.  These types of tests usually stop around the age 21. resume Coumadin today  eSigned:  Inda Castle, MD 12/26/2013 12:11 PM   cc:   PATIENT NAME:  Refujio, Haymer MR#: [id number]

## 2013-12-26 NOTE — Progress Notes (Signed)
No problems noted in the recovery room. maw 

## 2013-12-26 NOTE — Patient Instructions (Signed)
YOU HAD AN ENDOSCOPIC PROCEDURE TODAY AT THE [facility]: Refer to the procedure report that was given to you for any specific questions about what was found during the examination.  If the procedure report does not answer your questions, please call your gastroenterologist to clarify.  If you requested that your care partner not be given the details of your procedure findings, then the procedure report has been included in a sealed envelope for you to review at your convenience later.  YOU SHOULD EXPECT: Some feelings of bloating in the abdomen. Passage of more gas than usual.  Walking can help get rid of the air that was put into your GI tract during the procedure and reduce the bloating. If you had a lower endoscopy (such as a colonoscopy or flexible sigmoidoscopy) you may notice spotting of blood in your stool or on the toilet paper. If you underwent a bowel prep for your procedure, then you may not have a normal bowel movement for a few days.  DIET: Your first meal following the procedure should be a light meal and then it is ok to progress to your normal diet.  A half-sandwich or bowl of soup is an example of a good first meal.  Heavy or fried foods are harder to digest and may make you feel nauseous or bloated.  Likewise meals heavy in dairy and vegetables can cause extra gas to form and this can also increase the bloating.  Drink plenty of fluids but you should avoid alcoholic beverages for 24 hours.  ACTIVITY: Your care partner should take you home directly after the procedure.  You should plan to take it easy, moving slowly for the rest of the day.  You can resume normal activity the day after the procedure however you should NOT DRIVE or use heavy machinery for 24 hours (because of the sedation medicines used during the test).    SYMPTOMS TO REPORT IMMEDIATELY: A gastroenterologist can be reached at any hour.  During normal business hours, 8:30 AM to 5:00 PM Monday through Friday,  call [phone].  After hours and on weekends, please call the GI answering service at [phone] who will take a message and have the physician on call contact you.   Following lower endoscopy (colonoscopy or flexible sigmoidoscopy):  Excessive amounts of blood in the stool  Significant tenderness or worsening of abdominal pains  Swelling of the abdomen that is new, acute  Fever of 100F or higher    FOLLOW UP: If any biopsies were taken you will be contacted by phone or by letter within the next 1-3 weeks.  Call your gastroenterologist if you have not heard about the biopsies in 3 weeks.  Our staff will call the home number listed on your records the next business day following your procedure to check on you and address any questions or concerns that you may have at that time regarding the information given to you following your procedure. This is a courtesy call and so if there is no answer at the home number and we have not heard from you through the emergency physician on call, we will assume that you have returned to your regular daily activities without incident.  SIGNATURES/CONFIDENTIALITY: You and/or your care partner have signed paperwork which will be entered into your electronic medical record.  These signatures attest to the fact that that the information above on your After Visit Summary has been reviewed and is understood.  Full responsibility of the confidentiality   of this discharge information lies with you and/or your care-partner.     

## 2013-12-26 NOTE — Progress Notes (Signed)
Patient awakening,vss,report to rn 

## 2013-12-27 ENCOUNTER — Telehealth: Payer: Self-pay

## 2013-12-27 DIAGNOSIS — H26492 Other secondary cataract, left eye: Secondary | ICD-10-CM | POA: Diagnosis not present

## 2013-12-27 NOTE — Telephone Encounter (Signed)
Left message on answering machine. 

## 2014-01-01 ENCOUNTER — Other Ambulatory Visit: Payer: Self-pay | Admitting: Endocrinology

## 2014-01-02 ENCOUNTER — Ambulatory Visit (INDEPENDENT_AMBULATORY_CARE_PROVIDER_SITE_OTHER): Payer: Medicare Other | Admitting: *Deleted

## 2014-01-02 ENCOUNTER — Encounter: Payer: Self-pay | Admitting: Neurology

## 2014-01-02 ENCOUNTER — Encounter (HOSPITAL_COMMUNITY): Payer: Self-pay | Admitting: Cardiovascular Disease

## 2014-01-02 ENCOUNTER — Ambulatory Visit (INDEPENDENT_AMBULATORY_CARE_PROVIDER_SITE_OTHER): Payer: Medicare Other | Admitting: Neurology

## 2014-01-02 VITALS — BP 135/66 | HR 65 | Ht 67.0 in | Wt 185.2 lb

## 2014-01-02 DIAGNOSIS — Z5181 Encounter for therapeutic drug level monitoring: Secondary | ICD-10-CM | POA: Diagnosis not present

## 2014-01-02 DIAGNOSIS — R290 Tetany: Secondary | ICD-10-CM | POA: Diagnosis not present

## 2014-01-02 DIAGNOSIS — G7000 Myasthenia gravis without (acute) exacerbation: Secondary | ICD-10-CM

## 2014-01-02 DIAGNOSIS — I82402 Acute embolism and thrombosis of unspecified deep veins of left lower extremity: Secondary | ICD-10-CM

## 2014-01-02 DIAGNOSIS — I2699 Other pulmonary embolism without acute cor pulmonale: Secondary | ICD-10-CM | POA: Diagnosis not present

## 2014-01-02 DIAGNOSIS — I2510 Atherosclerotic heart disease of native coronary artery without angina pectoris: Secondary | ICD-10-CM | POA: Diagnosis not present

## 2014-01-02 DIAGNOSIS — Z7901 Long term (current) use of anticoagulants: Secondary | ICD-10-CM | POA: Diagnosis not present

## 2014-01-02 DIAGNOSIS — H0520 Unspecified exophthalmos: Secondary | ICD-10-CM

## 2014-01-02 LAB — POCT INR: INR: 1.4

## 2014-01-02 NOTE — Patient Instructions (Signed)
Myasthenia Gravis Myasthenia gravis is a disease that causes muscle weakness throughout the body. The muscles affected are the ones we can control (voluntary muscles). An example of a voluntary muscle is your hand muscles. You can control the muscles to make the hand pick something up. An example of an involuntary muscle is the heart. The heart beats without any direction from you.  Myasthenia Gravis is thought to be an autoimmune disease. That means that normal defenses of the body begin to attack the body. In this case, the immune system begins to attack cells located at the junctions of the muscles and the nerves. Women are affected more often. Women are affected at a younger age than men. Babies born to affected women frequently develop symptoms at an early age. SYMPTOMS Initially in the disease, the facial muscles are affected first. After this, a person may develop droopy eyelids. They may have difficulty controlling facial muscles. They may have problems chewing. Swallowing and speaking may become impaired. The weakness gradually spreads to the arms and legs. It begins to affect breathing. Sometimes, the symptoms lessen or go away without any apparent cause. DIAGNOSIS  Diagnosis can be made with blood tests. Tests such as electromyography may be done to examine the electrical activity in the muscle. An improvement in symptoms after having an anti-cholinesterase drug helps confirm the diagnosis.  TREATMENT  Medicines are usually prescribed as the first treatment. These medicines help, but they do not cure the disease. A plasma cleansing procedure (plasmapheresis) can be used to treat a crisis. It can also be used to prepare a person for surgery. This procedure produces short-term improvement. Some cases are helped by removing the thymus gland. Steroids are used for short-term benefits. Document Released: 05/04/2000 Document Revised: 04/20/2011 Document Reviewed: 03/29/2013 [employer] Patient  Information 2015 [employer]. This information is not intended to replace advice given to you by your health care provider. Make sure you discuss any questions you have with your health care provider.  

## 2014-01-02 NOTE — Progress Notes (Signed)
Reason for visit: Myasthenia gravis  [name] is an 73 y.o. male  History of present illness:  [name] is a 73 year old right-handed white male with a history of myasthenia gravis primarily with ocular features. The patient has done relatively well with his myasthenia, need denies any recent problems with ptosis or double vision. He continues to manipulate his prednisone dosing, he has dropped on his own from 15 mg daily to 10 mg daily 2 weeks ago. The patient has had problems with scleral hemorrhage bilaterally, and he was told by his ophthalmologist that he is developing proptosis bilaterally. 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 This is painful, and may occur while at rest. He returns to this office for an evaluation. He does not relate the spasms in the hands to the Mestinon dosing.  Past Medical History  Diagnosis Date  . Coronary atherosclerosis of unspecified type of vessel, native or graft     remote cath in 2003 showing distal and branch vessel CAD with normal LV function; managed medically  . Mixed hyperlipidemia   . Glucose intolerance (impaired glucose tolerance)     history of  . Diverticulosis of colon (without mention of hemorrhage)   . Hx of colonic polyps   . Internal hemorrhoids without mention of complication   . Rash and other nonspecific skin eruption   . Pneumonia, organism unspecified   . Conjunctivitis unspecified   . Myasthenia gravis     uses prednisone  . GERD (gastroesophageal reflux disease)   . Pulmonary embolism June 2013  . DVT (deep venous thrombosis) June 2013  . Chronic anticoagulation   . Diabetes mellitus   . DJD (degenerative joint disease)   . Ptosis   . CML (chronic myeloid leukemia)   . HTN (hypertension)   . IBS (irritable bowel syndrome)   . History of pneumonia   . Dystonia   . High cholesterol   . Heart disease   . Cancer   . Spinal stenosis of  lumbar region 06/01/2012  . PONV (postoperative nausea and vomiting)   . External hemorrhoids without mention of complication     blood clot left 6/13 with pe  . History of kidney stones   . Tubulovillous adenoma polyp of colon     2010    Past Surgical History  Procedure Laterality Date  . Angioplasty  2001  . Back surgery      x2, lumbar and cervical  . Tonsillectomy    . Cataract extraction Bilateral 12/12  . Shoulder surgery Left 05/07/2009  . Cardiac catheterization    . Lumbar laminectomy/decompression microdiscectomy Right 09/14/2012    Procedure: Right Lumbar three-four, four-five, Lumbar five-Sacral one decompressive laminectomy;  Surgeon: [name], MD;  Location: [place] [facility];  Service: Neurosurgery;  Laterality: Right;    Family History  Problem Relation Age of Onset  . Heart disease Mother   . Heart attack Mother   . Stroke Mother   . Heart disease Father   . Heart attack Father   . Stroke Father   . Breast cancer Sister     Twin   . Heart attack Sister   . Dementia Sister   . Heart disease Sister   . Heart attack Sister   . Clotting disorder Sister   . Heart attack Sister     Social history:  reports that he has never smoked. He has never used smokeless tobacco.  He reports that he does not drink alcohol or use illicit drugs.    Allergies  Allergen Reactions  . Oxycodone-Acetaminophen Other (See Comments)    "too strong"  . Sulfamethoxazole Rash and Itching    Medications:  Current Outpatient Prescriptions on File Prior to Visit  Medication Sig Dispense Refill  . esomeprazole (NEXIUM) 40 MG capsule Take 40 mg by mouth daily.    Marland Kitchen imatinib (GLEEVEC) 400 MG tablet Take 400 mg by mouth daily.     . isosorbide mononitrate (IMDUR) 30 MG 24 hr tablet TAKE 1 TABLET EVERY DAY 90 tablet 2  . loperamide (IMODIUM) 2 MG capsule Take 2 mg by mouth every morning. For diarrhea    . LORazepam (ATIVAN) 0.5 MG tablet Take 1 tablet (0.5 mg total) by mouth at  bedtime as needed. 30 tablet 1  . metFORMIN (GLUCOPHAGE-XR) 500 MG 24 hr tablet TAKE 1 TABLET EVERY DAY IN THE MORNING 90 tablet 0  . metoprolol succinate (TOPROL-XL) 25 MG 24 hr tablet TAKE 1 TABLET EVERY DAY 90 tablet 1  . Multiple Vitamin (MULTIVITAMIN) tablet Take 1 tablet by mouth daily.    . ondansetron (ZOFRAN) 4 MG tablet Take 4 mg by mouth every 6 (six) hours as needed.    . predniSONE (DELTASONE) 5 MG tablet Take 3 tablets (15 mg total) by mouth daily. (Patient taking differently: Take 5 mg by mouth 2 (two) times daily. ) 100 tablet 5  . pyridostigmine (MESTINON) 60 MG tablet TAKE 1 TABLET 3 TIMES A DAY 90 tablet 5  . rosuvastatin (CRESTOR) 10 MG tablet Take 5 mg by mouth every morning.  1 tablet 0  . traMADol (ULTRAM) 50 MG tablet Take 1 tablet (50 mg total) by mouth every 4 (four) hours as needed for pain. For pain 60 tablet 1  . warfarin (COUMADIN) 5 MG tablet TAKE 1 TABLET EVERY DAY AS DIRECTED 30 tablet 3  . [DISCONTINUED] zolpidem (AMBIEN) 10 MG tablet Take 10 mg by mouth at bedtime as needed.      No current facility-administered medications on file prior to visit.    ROS:  Out of a complete 14 system review of symptoms, the patient complains only of the following symptoms, and all other reviewed systems are negative.  Fatigue Eye redness Bruising easily  Blood pressure 135/66, pulse 65, height 5\' 7"  (1.702 m), weight 185 lb 3.2 oz (84.006 kg).  Physical Exam  General: The patient is alert and cooperative at the time of the examination.  Skin: No significant peripheral edema is noted.   Neurologic Exam  Mental status: The patient is oriented x 3.  Cranial nerves: Facial symmetry is present. Speech is normal, no aphasia or dysarthria is noted. Extraocular movements are full. Visual fields are full.  Motor: The patient has good strength in all 4 extremities.  Sensory examination: Soft touch sensation is symmetric on the face, arms, and legs.  Coordination: The  patient has good finger-nose-finger and heel-to-shin bilaterally.  Gait and station: The patient has a normal gait. Tandem gait is normal. Romberg is negative. No drift is seen.  Reflexes: Deep tendon reflexes are symmetric.   Assessment/Plan:  1. Myasthenia gravis with ocular features  2. Carpopedal spasms  The patient will be sent for blood work today. Given the proptosis, the patient will have a thyroid evaluation. He will otherwise follow-up in about 4 months. The prednisone may be decreased in the future, going down by 1 mg increments every 6 weeks or so. The  patient is not to alter his own prednisone dosing.  Jill Alexanders MD 01/02/2014 6:40 PM  [facility] [address] [place], [place] [address]  Phone [phone] Fax [phone]

## 2014-01-03 ENCOUNTER — Other Ambulatory Visit: Payer: Self-pay | Admitting: Cardiovascular Disease

## 2014-01-03 ENCOUNTER — Other Ambulatory Visit: Payer: Self-pay | Admitting: Endocrinology

## 2014-01-03 LAB — COMPREHENSIVE METABOLIC PANEL
ALT: 24 IU/L (ref 0–44)
AST: 23 IU/L (ref 0–40)
Albumin/Globulin Ratio: 2.7 — ABNORMAL HIGH (ref 1.1–2.5)
Albumin: 4 g/dL (ref 3.5–4.8)
Alkaline Phosphatase: 51 IU/L (ref 39–117)
BUN/Creatinine Ratio: 9 — ABNORMAL LOW (ref 10–22)
BUN: 11 mg/dL (ref 8–27)
CALCIUM: 9.4 mg/dL (ref 8.6–10.2)
CO2: 28 mmol/L (ref 18–29)
CREATININE: 1.21 mg/dL (ref 0.76–1.27)
Chloride: 102 mmol/L (ref 97–108)
GFR calc Af Amer: 67 mL/min/{1.73_m2} (ref >59)
GFR calc non Af Amer: 58 mL/min/{1.73_m2} — ABNORMAL LOW (ref >59)
GLOBULIN, TOTAL: 1.5 g/dL (ref 1.5–4.5)
GLUCOSE: 112 mg/dL — AB (ref 65–99)
POTASSIUM: 4.5 mmol/L (ref 3.5–5.2)
SODIUM: 143 mmol/L (ref 134–144)
TOTAL PROTEIN: 5.5 g/dL — AB (ref 6.0–8.5)
Total Bilirubin: 0.4 mg/dL (ref 0.0–1.2)

## 2014-01-03 LAB — MAGNESIUM: Magnesium: 1.6 mg/dL (ref 1.6–2.6)

## 2014-01-03 LAB — THYROID PEROXIDASE ANTIBODY

## 2014-01-03 LAB — TSH: TSH: 1.09 u[IU]/mL (ref 0.450–4.500)

## 2014-01-03 NOTE — Telephone Encounter (Signed)
Spoke with patient at length. He's had recurrent bleeding in eye. Sounds like subconjunctival hemorrhage. Has seen opthomologist and told that it's not serious but patient is frustrated.   Reviewed records: he had DVT/PE in 2013, spontaneous and has been on warfarin ever since. His CML is in remission. Will check with Dr Benay Spice to see his thoughts on whether patient needs to be maintained on lifelong warfarin.  Sherren Mocha 01/03/2014 3:53 PM

## 2014-01-10 ENCOUNTER — Ambulatory Visit (INDEPENDENT_AMBULATORY_CARE_PROVIDER_SITE_OTHER): Payer: Medicare Other | Admitting: *Deleted

## 2014-01-10 DIAGNOSIS — I2699 Other pulmonary embolism without acute cor pulmonale: Secondary | ICD-10-CM | POA: Diagnosis not present

## 2014-01-10 DIAGNOSIS — Z7901 Long term (current) use of anticoagulants: Secondary | ICD-10-CM

## 2014-01-10 DIAGNOSIS — Z5181 Encounter for therapeutic drug level monitoring: Secondary | ICD-10-CM | POA: Diagnosis not present

## 2014-01-10 LAB — POCT INR: INR: 2.2

## 2014-01-24 ENCOUNTER — Ambulatory Visit (INDEPENDENT_AMBULATORY_CARE_PROVIDER_SITE_OTHER): Payer: Medicare Other | Admitting: *Deleted

## 2014-01-24 DIAGNOSIS — I82402 Acute embolism and thrombosis of unspecified deep veins of left lower extremity: Secondary | ICD-10-CM | POA: Diagnosis not present

## 2014-01-24 DIAGNOSIS — Z5181 Encounter for therapeutic drug level monitoring: Secondary | ICD-10-CM

## 2014-01-24 DIAGNOSIS — I2699 Other pulmonary embolism without acute cor pulmonale: Secondary | ICD-10-CM | POA: Diagnosis not present

## 2014-01-24 DIAGNOSIS — Z7901 Long term (current) use of anticoagulants: Secondary | ICD-10-CM | POA: Diagnosis not present

## 2014-01-24 LAB — POCT INR: INR: 2.2

## 2014-01-25 DIAGNOSIS — L739 Follicular disorder, unspecified: Secondary | ICD-10-CM | POA: Diagnosis not present

## 2014-01-25 DIAGNOSIS — L570 Actinic keratosis: Secondary | ICD-10-CM | POA: Diagnosis not present

## 2014-01-25 DIAGNOSIS — L578 Other skin changes due to chronic exposure to nonionizing radiation: Secondary | ICD-10-CM | POA: Diagnosis not present

## 2014-01-26 DIAGNOSIS — D539 Nutritional anemia, unspecified: Secondary | ICD-10-CM | POA: Diagnosis not present

## 2014-01-26 DIAGNOSIS — C9210 Chronic myeloid leukemia, BCR/ABL-positive, not having achieved remission: Secondary | ICD-10-CM | POA: Diagnosis not present

## 2014-01-26 DIAGNOSIS — D7289 Other specified disorders of white blood cells: Secondary | ICD-10-CM | POA: Diagnosis not present

## 2014-01-26 DIAGNOSIS — C9211 Chronic myeloid leukemia, BCR/ABL-positive, in remission: Secondary | ICD-10-CM | POA: Diagnosis not present

## 2014-01-26 DIAGNOSIS — K222 Esophageal obstruction: Secondary | ICD-10-CM | POA: Diagnosis not present

## 2014-01-26 DIAGNOSIS — G7000 Myasthenia gravis without (acute) exacerbation: Secondary | ICD-10-CM | POA: Diagnosis not present

## 2014-01-30 ENCOUNTER — Other Ambulatory Visit: Payer: Self-pay | Admitting: Cardiovascular Disease

## 2014-01-30 ENCOUNTER — Other Ambulatory Visit: Payer: Self-pay | Admitting: Endocrinology

## 2014-01-30 ENCOUNTER — Other Ambulatory Visit: Payer: Self-pay | Admitting: Gastroenterology

## 2014-02-09 DIAGNOSIS — M810 Age-related osteoporosis without current pathological fracture: Secondary | ICD-10-CM

## 2014-02-09 HISTORY — DX: Age-related osteoporosis without current pathological fracture: M81.0

## 2014-02-14 ENCOUNTER — Ambulatory Visit (INDEPENDENT_AMBULATORY_CARE_PROVIDER_SITE_OTHER): Payer: Medicare Other | Admitting: *Deleted

## 2014-02-14 DIAGNOSIS — J209 Acute bronchitis, unspecified: Secondary | ICD-10-CM | POA: Diagnosis not present

## 2014-02-14 DIAGNOSIS — I82402 Acute embolism and thrombosis of unspecified deep veins of left lower extremity: Secondary | ICD-10-CM | POA: Diagnosis not present

## 2014-02-14 DIAGNOSIS — Z5181 Encounter for therapeutic drug level monitoring: Secondary | ICD-10-CM

## 2014-02-14 DIAGNOSIS — I2699 Other pulmonary embolism without acute cor pulmonale: Secondary | ICD-10-CM | POA: Diagnosis not present

## 2014-02-14 DIAGNOSIS — Z7901 Long term (current) use of anticoagulants: Secondary | ICD-10-CM

## 2014-02-14 LAB — POCT INR: INR: 1.9

## 2014-02-21 ENCOUNTER — Telehealth: Payer: Self-pay | Admitting: Endocrinology

## 2014-02-21 NOTE — Telephone Encounter (Signed)
Rec'd from [facility] forward 7 pages to Dr. Loanne Drilling

## 2014-02-24 ENCOUNTER — Telehealth: Payer: Self-pay | Admitting: Oncology

## 2014-02-24 NOTE — Telephone Encounter (Signed)
lvm and advised on appt d.t change due to md on call...pt ok and aware

## 2014-02-27 DIAGNOSIS — R220 Localized swelling, mass and lump, head: Secondary | ICD-10-CM | POA: Diagnosis not present

## 2014-02-28 ENCOUNTER — Telehealth: Payer: Self-pay | Admitting: Oncology

## 2014-02-28 NOTE — Telephone Encounter (Signed)
Pt called needed to r/s to May due to MD he see at [facility]..... KJ

## 2014-03-01 DIAGNOSIS — M25552 Pain in left hip: Secondary | ICD-10-CM | POA: Diagnosis not present

## 2014-03-06 DIAGNOSIS — M25552 Pain in left hip: Secondary | ICD-10-CM | POA: Diagnosis not present

## 2014-03-07 ENCOUNTER — Other Ambulatory Visit: Payer: Self-pay | Admitting: Cardiovascular Disease

## 2014-03-07 ENCOUNTER — Ambulatory Visit (INDEPENDENT_AMBULATORY_CARE_PROVIDER_SITE_OTHER): Payer: Medicare Other | Admitting: *Deleted

## 2014-03-07 DIAGNOSIS — Z5181 Encounter for therapeutic drug level monitoring: Secondary | ICD-10-CM

## 2014-03-07 DIAGNOSIS — Z7901 Long term (current) use of anticoagulants: Secondary | ICD-10-CM | POA: Diagnosis not present

## 2014-03-07 DIAGNOSIS — I82402 Acute embolism and thrombosis of unspecified deep veins of left lower extremity: Secondary | ICD-10-CM | POA: Diagnosis not present

## 2014-03-07 DIAGNOSIS — I2699 Other pulmonary embolism without acute cor pulmonale: Secondary | ICD-10-CM | POA: Diagnosis not present

## 2014-03-07 LAB — POCT INR: INR: 1.8

## 2014-03-10 DIAGNOSIS — M7062 Trochanteric bursitis, left hip: Secondary | ICD-10-CM | POA: Diagnosis not present

## 2014-03-10 DIAGNOSIS — S76012D Strain of muscle, fascia and tendon of left hip, subsequent encounter: Secondary | ICD-10-CM | POA: Diagnosis not present

## 2014-03-10 DIAGNOSIS — M25552 Pain in left hip: Secondary | ICD-10-CM | POA: Diagnosis not present

## 2014-03-16 DIAGNOSIS — M25552 Pain in left hip: Secondary | ICD-10-CM | POA: Diagnosis not present

## 2014-03-16 DIAGNOSIS — S76012D Strain of muscle, fascia and tendon of left hip, subsequent encounter: Secondary | ICD-10-CM | POA: Diagnosis not present

## 2014-03-16 DIAGNOSIS — M7062 Trochanteric bursitis, left hip: Secondary | ICD-10-CM | POA: Diagnosis not present

## 2014-03-21 ENCOUNTER — Ambulatory Visit (INDEPENDENT_AMBULATORY_CARE_PROVIDER_SITE_OTHER): Payer: Medicare Other | Admitting: *Deleted

## 2014-03-21 DIAGNOSIS — Z7901 Long term (current) use of anticoagulants: Secondary | ICD-10-CM

## 2014-03-21 DIAGNOSIS — I2699 Other pulmonary embolism without acute cor pulmonale: Secondary | ICD-10-CM | POA: Diagnosis not present

## 2014-03-21 DIAGNOSIS — Z5181 Encounter for therapeutic drug level monitoring: Secondary | ICD-10-CM

## 2014-03-21 LAB — POCT INR: INR: 7

## 2014-03-21 LAB — PROTIME-INR: Prothrombin Time: 75.8 s (ref 9.6–13.1)

## 2014-03-28 ENCOUNTER — Ambulatory Visit (INDEPENDENT_AMBULATORY_CARE_PROVIDER_SITE_OTHER): Payer: Medicare Other | Admitting: *Deleted

## 2014-03-28 DIAGNOSIS — I82409 Acute embolism and thrombosis of unspecified deep veins of unspecified lower extremity: Secondary | ICD-10-CM | POA: Diagnosis not present

## 2014-03-28 DIAGNOSIS — N419 Inflammatory disease of prostate, unspecified: Secondary | ICD-10-CM | POA: Diagnosis not present

## 2014-03-28 DIAGNOSIS — Z5181 Encounter for therapeutic drug level monitoring: Secondary | ICD-10-CM | POA: Diagnosis not present

## 2014-03-28 DIAGNOSIS — Z7901 Long term (current) use of anticoagulants: Secondary | ICD-10-CM

## 2014-03-28 DIAGNOSIS — I2699 Other pulmonary embolism without acute cor pulmonale: Secondary | ICD-10-CM

## 2014-03-28 DIAGNOSIS — R300 Dysuria: Secondary | ICD-10-CM | POA: Diagnosis not present

## 2014-03-28 LAB — POCT INR: INR: 1.6

## 2014-03-29 ENCOUNTER — Ambulatory Visit: Payer: Medicare Other | Admitting: Oncology

## 2014-04-04 ENCOUNTER — Ambulatory Visit (INDEPENDENT_AMBULATORY_CARE_PROVIDER_SITE_OTHER): Payer: Medicare Other | Admitting: Pharmacist

## 2014-04-04 DIAGNOSIS — I82409 Acute embolism and thrombosis of unspecified deep veins of unspecified lower extremity: Secondary | ICD-10-CM

## 2014-04-04 DIAGNOSIS — Z5181 Encounter for therapeutic drug level monitoring: Secondary | ICD-10-CM

## 2014-04-04 DIAGNOSIS — Z7901 Long term (current) use of anticoagulants: Secondary | ICD-10-CM | POA: Diagnosis not present

## 2014-04-04 DIAGNOSIS — I2699 Other pulmonary embolism without acute cor pulmonale: Secondary | ICD-10-CM

## 2014-04-04 LAB — POCT INR: INR: 4.1

## 2014-04-05 ENCOUNTER — Encounter (HOSPITAL_COMMUNITY): Payer: Self-pay | Admitting: Cardiovascular Disease

## 2014-04-06 ENCOUNTER — Ambulatory Visit: Payer: Medicare Other | Admitting: Oncology

## 2014-04-09 ENCOUNTER — Encounter: Payer: Self-pay | Admitting: Endocrinology

## 2014-04-09 ENCOUNTER — Ambulatory Visit (INDEPENDENT_AMBULATORY_CARE_PROVIDER_SITE_OTHER): Payer: Medicare Other | Admitting: Endocrinology

## 2014-04-09 VITALS — BP 134/88 | HR 84 | Temp 98.4°F | Ht 67.0 in | Wt 181.0 lb

## 2014-04-09 DIAGNOSIS — R609 Edema, unspecified: Secondary | ICD-10-CM

## 2014-04-09 DIAGNOSIS — R0600 Dyspnea, unspecified: Secondary | ICD-10-CM | POA: Diagnosis not present

## 2014-04-09 DIAGNOSIS — E1159 Type 2 diabetes mellitus with other circulatory complications: Secondary | ICD-10-CM

## 2014-04-09 LAB — BASIC METABOLIC PANEL
BUN: 15 mg/dL (ref 6–23)
CO2: 32 meq/L (ref 19–32)
Calcium: 9.1 mg/dL (ref 8.4–10.5)
Chloride: 103 mEq/L (ref 96–112)
Creatinine, Ser: 1.35 mg/dL (ref 0.40–1.50)
GFR: 54.62 mL/min — AB (ref >60.00)
GLUCOSE: 148 mg/dL — AB (ref 70–99)
POTASSIUM: 3.7 meq/L (ref 3.5–5.1)
Sodium: 140 mEq/L (ref 135–145)

## 2014-04-09 LAB — URINALYSIS, ROUTINE W REFLEX MICROSCOPIC
Bilirubin Urine: NEGATIVE
Hgb urine dipstick: NEGATIVE
Ketones, ur: NEGATIVE
Leukocytes, UA: NEGATIVE
Nitrite: NEGATIVE
RBC / HPF: NONE SEEN (ref 0–?)
SPECIFIC GRAVITY, URINE: 1.02 (ref 1.000–1.030)
Total Protein, Urine: NEGATIVE
URINE GLUCOSE: NEGATIVE
UROBILINOGEN UA: 1 (ref 0.0–1.0)
pH: 6 (ref 5.0–8.0)

## 2014-04-09 LAB — HEPATIC FUNCTION PANEL
ALBUMIN: 3.6 g/dL (ref 3.5–5.2)
ALT: 48 U/L (ref 0–53)
AST: 27 U/L (ref 0–37)
Alkaline Phosphatase: 42 U/L (ref 39–117)
Bilirubin, Direct: 0.1 mg/dL (ref 0.0–0.3)
TOTAL PROTEIN: 5.4 g/dL — AB (ref 6.0–8.3)
Total Bilirubin: 0.6 mg/dL (ref 0.2–1.2)

## 2014-04-09 LAB — HEMOGLOBIN A1C: HEMOGLOBIN A1C: 6.6 % — AB (ref 4.6–6.5)

## 2014-04-09 LAB — BRAIN NATRIURETIC PEPTIDE: Pro B Natriuretic peptide (BNP): 49 pg/mL (ref 0.0–100.0)

## 2014-04-09 NOTE — Patient Instructions (Addendum)
Please come back for a "medicare wellness" appointment in 6 months (after 04/25/14).  blood tests are being requested for you today.  We'll let you know about the results.  Let's also check an "echo" (easy and painless heart test).  you will receive a phone call, about a day and time for an appointment.

## 2014-04-09 NOTE — Progress Notes (Signed)
Subjective:    Patient ID: [name], male    DOB: 1940-03-19, 74 y.o.   MRN: [id number]  HPI Pt states 2 weeks of moderate swelling of both ankles, and slight assoc pain.  [name] is unable to cite precip factor such as med change.   Past Medical History  Diagnosis Date  . Coronary atherosclerosis of unspecified type of vessel, native or graft     remote cath in 2003 showing distal and branch vessel CAD with normal LV function; managed medically  . Mixed hyperlipidemia   . Glucose intolerance (impaired glucose tolerance)     history of  . Diverticulosis of colon (without mention of hemorrhage)   . Hx of colonic polyps   . Internal hemorrhoids without mention of complication   . Rash and other nonspecific skin eruption   . Pneumonia, organism unspecified   . Conjunctivitis unspecified   . Myasthenia gravis     uses prednisone  . GERD (gastroesophageal reflux disease)   . Pulmonary embolism June 2013  . DVT (deep venous thrombosis) June 2013  . Chronic anticoagulation   . Diabetes mellitus   . DJD (degenerative joint disease)   . Ptosis   . CML (chronic myeloid leukemia)   . HTN (hypertension)   . IBS (irritable bowel syndrome)   . History of pneumonia   . Dystonia   . High cholesterol   . Heart disease   . Cancer   . 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 PONV (postoperative nausea and vomiting)   . External hemorrhoids without mention of complication     blood clot left 6/13 with pe  . History of kidney stones   . Tubulovillous adenoma polyp of colon     2010    Past Surgical History  Procedure Laterality Date  . Angioplasty  2001  . Back surgery      x2, lumbar and cervical  . Tonsillectomy    . Cataract extraction Bilateral 12/12  . Shoulder surgery Left 05/07/2009  . Cardiac catheterization    . Lumbar laminectomy/decompression microdiscectomy Right 09/14/2012    Procedure: Right Lumbar three-four, four-five, Lumbar five-Sacral one decompressive  laminectomy;  Surgeon: [name], MD;  Location: [place] [facility];  Service: Neurosurgery;  Laterality: Right;    History   Social History  . Marital Status: Married    Spouse Name: N/A  . Number of Children: 2  . Years of Education: 1-College   Occupational History  . retired     Retired   Social History Main Topics  . Smoking status: Never Smoker   . Smokeless tobacco: Never Used  . Alcohol Use: No  . Drug Use: No  . Sexual Activity: Not Currently   Other Topics Concern  . Not on file   Social History Narrative    Current Outpatient Prescriptions on File Prior to Visit  Medication Sig Dispense Refill  . CRESTOR 10 MG tablet TAKE 1/2 TABLET EVERY DAY 45 tablet 1  . imatinib (GLEEVEC) 400 MG tablet Take 400 mg by mouth daily.     . isosorbide mononitrate (IMDUR) 30 MG 24 hr tablet TAKE 1 TABLET EVERY DAY 90 tablet 2  . loperamide (IMODIUM) 2 MG capsule Take 2 mg by mouth every morning. For diarrhea    . LORazepam (ATIVAN) 0.5 MG tablet Take 1 tablet (0.5 mg total) by mouth at bedtime as needed. 30 tablet 1  . metFORMIN (GLUCOPHAGE-XR) 500 MG 24 hr tablet TAKE 1 TABLET  EVERY DAY IN THE MORNING 90 tablet 0  . metFORMIN (GLUCOPHAGE-XR) 500 MG 24 hr tablet TAKE 1 TABLET EVERY DAY IN THE MORNING 90 tablet 0  . metoprolol succinate (TOPROL-XL) 25 MG 24 hr tablet TAKE 1 TABLET EVERY DAY 90 tablet 0  . Multiple Vitamin (MULTIVITAMIN) tablet Take 1 tablet by mouth daily.    Marland Kitchen NEXIUM 40 MG capsule TAKE 1 CAPSULE EVERY MORNING 90 capsule 2  . ondansetron (ZOFRAN) 4 MG tablet Take 4 mg by mouth every 6 (six) hours as needed.    . predniSONE (DELTASONE) 5 MG tablet Take 3 tablets (15 mg total) by mouth daily. (Patient taking differently: Take 5 mg by mouth 2 (two) times daily. ) 100 tablet 5  . pyridostigmine (MESTINON) 60 MG tablet TAKE 1 TABLET 3 TIMES A DAY 90 tablet 5  . rosuvastatin (CRESTOR) 10 MG tablet Take 5 mg by mouth every morning.  1 tablet 0  . traMADol (ULTRAM) 50 MG  tablet Take 1 tablet (50 mg total) by mouth every 4 (four) hours as needed for pain. For pain 60 tablet 1  . warfarin (COUMADIN) 5 MG tablet TAKE 1 TABLET EVERY DAY AS DIRECTED 30 tablet 3  . [DISCONTINUED] zolpidem (AMBIEN) 10 MG tablet Take 10 mg by mouth at bedtime as needed.      No current facility-administered medications on file prior to visit.    Allergies  Allergen Reactions  . Oxycodone-Acetaminophen Other (See Comments)    "too strong"  . Sulfamethoxazole Rash and Itching    Family History  Problem Relation Age of Onset  . Heart disease Mother   . Heart attack Mother   . Stroke Mother   . Heart disease Father   . Heart attack Father   . Stroke Father   . Breast cancer Sister     Twin   . Heart attack Sister   . Dementia Sister   . Heart disease Sister   . Heart attack Sister   . Clotting disorder Sister   . Heart attack Sister     BP 134/88 mmHg  Pulse 84  Temp(Src) 98.4 F (36.9 C) (Oral)  Ht 5\' 7"  (1.702 m)  Wt 181 lb (82.101 kg)  BMI 28.34 kg/m2  SpO2 95%  Review of Systems Denies chest pain and rash.  [name] has slight doe.      Objective:   Physical Exam VITAL SIGNS:  See vs page GENERAL: no distress Pulses: dorsalis pedis intact bilat.   MSK: no deformity of the feet CV: 1+ bilat leg edema Skin:  no ulcer on the feet.  normal color and temp on the feet. Neuro: sensation is intact to touch on the feet   BNP=normal Albumin=normal Lab Results  Component Value Date   CREATININE 1.35 04/09/2014   BUN 15 04/09/2014   NA 140 04/09/2014   K 3.7 04/09/2014   CL 103 04/09/2014   CO2 32 04/09/2014        Assessment & Plan:  Edema, new, uncertain etiology.   DM: mild hyperglycemia today.  Same rx   Patient is advised the following: Patient Instructions  Please come back for a "medicare wellness" appointment in 6 months (after 04/25/14).  blood tests are being requested for you today.  We'll let you know about the results.  Let's also check  an "echo" (easy and painless heart test).  you will receive a phone call, about a day and time for an appointment.    addendum: i offered  to rx lasix

## 2014-04-10 ENCOUNTER — Other Ambulatory Visit: Payer: Self-pay | Admitting: Endocrinology

## 2014-04-10 ENCOUNTER — Encounter: Payer: Self-pay | Admitting: Endocrinology

## 2014-04-10 LAB — D-DIMER, QUANTITATIVE (NOT AT ARMC): D DIMER QUANT: 0.28 ug{FEU}/mL (ref 0.00–0.48)

## 2014-04-10 MED ORDER — FUROSEMIDE 20 MG PO TABS: ORAL_TABLET | ORAL | Status: DC

## 2014-04-13 ENCOUNTER — Ambulatory Visit (HOSPITAL_COMMUNITY): Payer: Medicare Other | Attending: Cardiology | Admitting: Cardiology

## 2014-04-13 ENCOUNTER — Ambulatory Visit (INDEPENDENT_AMBULATORY_CARE_PROVIDER_SITE_OTHER): Payer: Medicare Other | Admitting: *Deleted

## 2014-04-13 DIAGNOSIS — Z5181 Encounter for therapeutic drug level monitoring: Secondary | ICD-10-CM

## 2014-04-13 DIAGNOSIS — R0600 Dyspnea, unspecified: Secondary | ICD-10-CM | POA: Diagnosis not present

## 2014-04-13 DIAGNOSIS — E119 Type 2 diabetes mellitus without complications: Secondary | ICD-10-CM | POA: Insufficient documentation

## 2014-04-13 DIAGNOSIS — I82409 Acute embolism and thrombosis of unspecified deep veins of unspecified lower extremity: Secondary | ICD-10-CM | POA: Diagnosis not present

## 2014-04-13 DIAGNOSIS — I2699 Other pulmonary embolism without acute cor pulmonale: Secondary | ICD-10-CM | POA: Diagnosis not present

## 2014-04-13 DIAGNOSIS — I10 Essential (primary) hypertension: Secondary | ICD-10-CM | POA: Diagnosis not present

## 2014-04-13 DIAGNOSIS — E785 Hyperlipidemia, unspecified: Secondary | ICD-10-CM | POA: Insufficient documentation

## 2014-04-13 DIAGNOSIS — Z7901 Long term (current) use of anticoagulants: Secondary | ICD-10-CM | POA: Diagnosis not present

## 2014-04-13 LAB — POCT INR: INR: 2.5

## 2014-04-13 NOTE — Progress Notes (Signed)
Echo performed. 

## 2014-04-16 ENCOUNTER — Encounter: Payer: Self-pay | Admitting: Endocrinology

## 2014-04-16 DIAGNOSIS — M7062 Trochanteric bursitis, left hip: Secondary | ICD-10-CM | POA: Diagnosis not present

## 2014-04-16 DIAGNOSIS — S76012D Strain of muscle, fascia and tendon of left hip, subsequent encounter: Secondary | ICD-10-CM | POA: Diagnosis not present

## 2014-04-19 ENCOUNTER — Encounter: Payer: Self-pay | Admitting: Endocrinology

## 2014-04-24 DIAGNOSIS — R609 Edema, unspecified: Secondary | ICD-10-CM | POA: Diagnosis not present

## 2014-04-24 DIAGNOSIS — C9210 Chronic myeloid leukemia, BCR/ABL-positive, not having achieved remission: Secondary | ICD-10-CM | POA: Diagnosis not present

## 2014-04-26 ENCOUNTER — Ambulatory Visit (INDEPENDENT_AMBULATORY_CARE_PROVIDER_SITE_OTHER): Payer: Medicare Other

## 2014-04-26 DIAGNOSIS — Z5181 Encounter for therapeutic drug level monitoring: Secondary | ICD-10-CM

## 2014-04-26 DIAGNOSIS — I2699 Other pulmonary embolism without acute cor pulmonale: Secondary | ICD-10-CM

## 2014-04-26 DIAGNOSIS — Z7901 Long term (current) use of anticoagulants: Secondary | ICD-10-CM | POA: Diagnosis not present

## 2014-04-26 LAB — POCT INR: INR: 4.1

## 2014-05-04 ENCOUNTER — Other Ambulatory Visit (HOSPITAL_COMMUNITY): Payer: Self-pay | Admitting: *Deleted

## 2014-05-04 DIAGNOSIS — I6523 Occlusion and stenosis of bilateral carotid arteries: Secondary | ICD-10-CM

## 2014-05-09 ENCOUNTER — Ambulatory Visit (INDEPENDENT_AMBULATORY_CARE_PROVIDER_SITE_OTHER): Payer: Medicare Other | Admitting: *Deleted

## 2014-05-09 ENCOUNTER — Encounter: Payer: Self-pay | Admitting: Neurology

## 2014-05-09 ENCOUNTER — Ambulatory Visit (INDEPENDENT_AMBULATORY_CARE_PROVIDER_SITE_OTHER): Payer: Medicare Other | Admitting: Neurology

## 2014-05-09 VITALS — BP 150/80 | HR 68 | Ht 67.0 in | Wt 185.4 lb

## 2014-05-09 DIAGNOSIS — Z5181 Encounter for therapeutic drug level monitoring: Secondary | ICD-10-CM

## 2014-05-09 DIAGNOSIS — Z7901 Long term (current) use of anticoagulants: Secondary | ICD-10-CM

## 2014-05-09 DIAGNOSIS — E538 Deficiency of other specified B group vitamins: Secondary | ICD-10-CM

## 2014-05-09 DIAGNOSIS — H02409 Unspecified ptosis of unspecified eyelid: Secondary | ICD-10-CM | POA: Diagnosis not present

## 2014-05-09 DIAGNOSIS — G7000 Myasthenia gravis without (acute) exacerbation: Secondary | ICD-10-CM | POA: Diagnosis not present

## 2014-05-09 DIAGNOSIS — I82409 Acute embolism and thrombosis of unspecified deep veins of unspecified lower extremity: Secondary | ICD-10-CM

## 2014-05-09 DIAGNOSIS — R5383 Other fatigue: Secondary | ICD-10-CM | POA: Diagnosis not present

## 2014-05-09 DIAGNOSIS — R5382 Chronic fatigue, unspecified: Secondary | ICD-10-CM | POA: Diagnosis not present

## 2014-05-09 DIAGNOSIS — H02403 Unspecified ptosis of bilateral eyelids: Secondary | ICD-10-CM

## 2014-05-09 DIAGNOSIS — I2699 Other pulmonary embolism without acute cor pulmonale: Secondary | ICD-10-CM | POA: Diagnosis not present

## 2014-05-09 DIAGNOSIS — I6523 Occlusion and stenosis of bilateral carotid arteries: Secondary | ICD-10-CM

## 2014-05-09 LAB — POCT INR: INR: 2.1

## 2014-05-09 NOTE — Progress Notes (Signed)
Reason for visit: Myasthenia gravis  [name] is an 74 y.o. male  History of present illness:  Mr. Schleyer is a 74 year old right-handed white male with a history of myasthenia gravis. The patient has had some issues with worsening fatigue over the last 6-8 months. He does have somewhat short-winded with walking. 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 The patient has developed some proptosis, he had recent thyroid studies done which were unremarkable. The patient denies any safety issues with double vision or ptosis, and he has no weakness of the extremities. The patient suffers from generalized fatigue. He at times is not sleeping well, he wakes up in the middle of the night, and cannot get back to sleep. He has had some swelling in the hands and legs, and he is on a diuretic. He returns to the office today for an evaluation. He remains on prednisone 10 mg daily, and he takes Mestinon for the myasthenia.  Past Medical History  Diagnosis Date  . Coronary atherosclerosis of unspecified type of vessel, native or graft     remote cath in 2003 showing distal and branch vessel CAD with normal LV function; managed medically  . Mixed hyperlipidemia   . Glucose intolerance (impaired glucose tolerance)     history of  . Diverticulosis of colon (without mention of hemorrhage)   . Hx of colonic polyps   . Internal hemorrhoids without mention of complication   . Rash and other nonspecific skin eruption   . Pneumonia, organism unspecified   . Conjunctivitis unspecified   . Myasthenia gravis     uses prednisone  . GERD (gastroesophageal reflux disease)   . Pulmonary embolism June 2013  . DVT (deep venous thrombosis) June 2013  . Chronic anticoagulation   . Diabetes mellitus   . DJD (degenerative joint disease)   . Ptosis   . CML (chronic myeloid leukemia)   . HTN (hypertension)   . IBS (irritable bowel syndrome)   . History of pneumonia   .  Dystonia   . High cholesterol   . Heart disease   . Cancer   . 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 PONV (postoperative nausea and vomiting)   . External hemorrhoids without mention of complication     blood clot left 6/13 with pe  . History of kidney stones   . Tubulovillous adenoma polyp of colon     2010  . HOH (hard of hearing)     hearing aids    Past Surgical History  Procedure Laterality Date  . Angioplasty  2001  . Back surgery      x2, lumbar and cervical  . Tonsillectomy    . Cataract extraction Bilateral 12/12  . Shoulder surgery Left 05/07/2009  . Cardiac catheterization    . Lumbar laminectomy/decompression microdiscectomy Right 09/14/2012    Procedure: Right Lumbar three-four, four-five, Lumbar five-Sacral one decompressive laminectomy;  Surgeon: [name], MD;  Location: [facility];  Service: Neurosurgery;  Laterality: Right;    Family History  Problem Relation Age of Onset  . Heart disease Mother   . Heart attack Mother   . Stroke Mother   . Heart disease Father   . Heart attack Father   . Stroke Father   . Breast cancer Sister     Twin   . Heart attack Sister   . Dementia Sister   . Heart disease Sister   . Heart attack  Sister   . Clotting disorder Sister   . Heart attack Sister     Social history:  reports that he has never smoked. He has never used smokeless tobacco. He reports that he does not drink alcohol or use illicit drugs.    Allergies  Allergen Reactions  . Oxycodone-Acetaminophen Other (See Comments)    "too strong"  . Sulfamethoxazole Rash and Itching    Medications:  Prior to Admission medications   Medication Sig Start Date End Date Taking? Authorizing Provider  CRESTOR 10 MG tablet TAKE 1/2 TABLET EVERY DAY 01/05/14  Yes Sherren Mocha, MD  furosemide (LASIX) 20 MG tablet 1/2 tab daily 04/10/14  Yes Renato Shin, MD  imatinib (GLEEVEC) 400 MG tablet Take 400 mg by mouth daily.    Yes Historical Provider, MD    isosorbide mononitrate (IMDUR) 30 MG 24 hr tablet TAKE 1 TABLET EVERY DAY 12/27/13  Yes Sherren Mocha, MD  loperamide (IMODIUM) 2 MG capsule Take 2 mg by mouth every morning. For diarrhea   Yes Historical Provider, MD  LORazepam (ATIVAN) 0.5 MG tablet Take 1 tablet (0.5 mg total) by mouth at bedtime as needed. 09/15/13  Yes Ladell Pier, MD  metFORMIN (GLUCOPHAGE-XR) 500 MG 24 hr tablet TAKE 1 TABLET EVERY DAY IN THE MORNING 01/02/14  Yes Renato Shin, MD  metoprolol succinate (TOPROL-XL) 25 MG 24 hr tablet TAKE 1 TABLET EVERY DAY 03/08/14  Yes Sherren Mocha, MD  Multiple Vitamin (MULTIVITAMIN) tablet Take 1 tablet by mouth daily.   Yes Historical Provider, MD  NEXIUM 40 MG capsule TAKE 1 CAPSULE EVERY MORNING 01/31/14  Yes Inda Castle, MD  oxyCODONE-acetaminophen (PERCOCET) 10-325 MG per tablet Take 1 tablet by mouth every 4 (four) hours as needed for pain.   Yes Historical Provider, MD  potassium chloride (K-DUR,KLOR-CON) 10 MEQ tablet Take 10 mEq by mouth daily.   Yes Historical Provider, MD  predniSONE (DELTASONE) 5 MG tablet Take 3 tablets (15 mg total) by mouth daily. Patient taking differently: Take 5 mg by mouth 2 (two) times daily.  10/19/13  Yes Kathrynn Ducking, MD  pyridostigmine (MESTINON) 60 MG tablet TAKE 1 TABLET 3 TIMES A DAY 12/17/13  Yes Kathrynn Ducking, MD  warfarin (COUMADIN) 5 MG tablet TAKE 1 TABLET EVERY DAY AS DIRECTED 12/18/13  Yes Sherren Mocha, MD    ROS:  Out of a complete 14 system review of symptoms, the patient complains only of the following symptoms, and all other reviewed systems are negative.  Eye redness Leg swelling Achy muscles  Blood pressure 150/80, pulse 68, height 5\' 7"  (1.702 m), weight 185 lb 6.4 oz (84.097 kg).  Physical Exam  General: The patient is alert and cooperative at the time of the examination.  Skin: No significant peripheral edema is noted.   Neurologic Exam  Mental status: The patient is alert and oriented x 3 at the  time of the examination. The patient has apparent normal recent and remote memory, with an apparently normal attention span and concentration ability.   Cranial nerves: Facial symmetry is present. Speech is normal, no aphasia or dysarthria is noted. Extraocular movements are full. Visual fields are full. With superior gaze for 1 minute, no reports of subjective double vision, ptosis, or divergence of gaze is seen.  Motor: The patient has good strength in all 4 extremities. With arms outstretched 1 minute, no fatigable weakness of the deltoid muscles were noted.  Sensory examination: Soft touch sensation is symmetric on the face,  arms, and legs.  Coordination: The patient has good finger-nose-finger and heel-to-shin bilaterally.  Gait and station: The patient has a normal gait. Tandem gait is normal. Romberg is negative. No drift is seen.  Reflexes: Deep tendon reflexes are symmetric.   Assessment/Plan:  1. Myasthenia gravis  2. Proptosis, bilateral  3. Chronic fatigue  The patient is having a new issue with severe chronic fatigue. Certainly, this may be a at the sensation of myasthenia gravis, but the patient will undergo further blood work today looking for another cause of the fatigue. The patient has a mild chronic anemia associated with the diagnosis of CML. He will follow-up in 6 months.  Jill Alexanders MD 05/09/2014 8:00 PM  [facility] [address] [place], [place] [address]  Phone [phone] Fax [phone]

## 2014-05-09 NOTE — Patient Instructions (Signed)
Myasthenia Gravis Myasthenia gravis is a disease that causes muscle weakness throughout the body. The muscles affected are the ones we can control (voluntary muscles). An example of a voluntary muscle is your hand muscles. You can control the muscles to make the hand pick something up. An example of an involuntary muscle is the heart. The heart beats without any direction from you.  Myasthenia Gravis is thought to be an autoimmune disease. That means that normal defenses of the body begin to attack the body. In this case, the immune system begins to attack cells located at the junctions of the muscles and the nerves. Women are affected more often. Women are affected at a younger age than men. Babies born to affected women frequently develop symptoms at an early age. SYMPTOMS Initially in the disease, the facial muscles are affected first. After this, a person may develop droopy eyelids. They may have difficulty controlling facial muscles. They may have problems chewing. Swallowing and speaking may become impaired. The weakness gradually spreads to the arms and legs. It begins to affect breathing. Sometimes, the symptoms lessen or go away without any apparent cause. DIAGNOSIS  Diagnosis can be made with blood tests. Tests such as electromyography may be done to examine the electrical activity in the muscle. An improvement in symptoms after having an anti-cholinesterase drug helps confirm the diagnosis.  TREATMENT  Medicines are usually prescribed as the first treatment. These medicines help, but they do not cure the disease. A plasma cleansing procedure (plasmapheresis) can be used to treat a crisis. It can also be used to prepare a person for surgery. This procedure produces short-term improvement. Some cases are helped by removing the thymus gland. Steroids are used for short-term benefits. Document Released: 05/04/2000 Document Revised: 04/20/2011 Document Reviewed: 03/29/2013 [employer] Patient  Information 2015 [employer]. This information is not intended to replace advice given to you by your health care provider. Make sure you discuss any questions you have with your health care provider.  

## 2014-05-11 LAB — METHYLMALONIC ACID, SERUM: Methylmalonic Acid: 219 nmol/L (ref 0–378)

## 2014-05-11 LAB — COPPER, SERUM: Copper: 79 ug/dL (ref 72–166)

## 2014-05-11 LAB — VITAMIN B12: Vitamin B-12: 412 pg/mL (ref 211–946)

## 2014-05-22 ENCOUNTER — Other Ambulatory Visit: Payer: Self-pay

## 2014-05-22 ENCOUNTER — Telehealth: Payer: Self-pay | Admitting: Cardiovascular Disease

## 2014-05-22 MED ORDER — NITROGLYCERIN 0.4 MG SL SUBL: 0.4000 mg | SUBLINGUAL_TABLET | SUBLINGUAL | Status: DC | PRN

## 2014-05-22 NOTE — Telephone Encounter (Signed)
Pt called with constant breast bone pain at a level 2 since mid-morning with intermentant sharp pains taking it to a score of 8 or 9.  Pt stated he has no c/o of SOB, N/V, or lightheadedness.  Pt states he dose have pain when taking some deep breaths.  Pt stated that PCP started him on Lasix 10 mg daily for swelling in upper and lower extremities.  Pt stated that he has been taking this medication and all medications as ordered. Pt BP 145/75 Hr 70's. Pt state that he is having some swelling in feet and ankles that last about 2 seconds when he pushes into them.  Pt going to take a Tums as he stated it could be heart burn.   Pt information shared with DOD, recommends that pt increase Lasix to 40 mg daily for next 3 days, RN to order [employer] for CP, and pt go to ED if pain continues.   Pt made aware of MD recommendations and agrees with plan.  Pt has already taken his 10 mg of Lasix today and will take another pill and an half today and take 40 mg (2 tablets) on Wednesday and Thursday, and back to 10 mg daily on Friday. Pt aware of Nitro Rx sent to pharmacy.  Pt agrees to go to ED to be evaluated if pain persists or increases.

## 2014-05-22 NOTE — Telephone Encounter (Signed)
New message     Pt c/o of Chest Pain: STAT if CP now or developed within 24 hours  1. Are you having CP right now? Yes, when he takes a deep breathe 2. Are you experiencing any other symptoms (ex. SOB, nausea, vomiting, sweating)? no 3. How long have you been experiencing CP? 10am today  4. Is your CP continuous or coming and going? continuous 5. Have you taken Nitroglycerin? no ?

## 2014-05-28 DIAGNOSIS — B351 Tinea unguium: Secondary | ICD-10-CM | POA: Diagnosis not present

## 2014-05-28 DIAGNOSIS — L821 Other seborrheic keratosis: Secondary | ICD-10-CM | POA: Diagnosis not present

## 2014-05-28 DIAGNOSIS — R233 Spontaneous ecchymoses: Secondary | ICD-10-CM | POA: Diagnosis not present

## 2014-05-28 DIAGNOSIS — L570 Actinic keratosis: Secondary | ICD-10-CM | POA: Diagnosis not present

## 2014-05-30 ENCOUNTER — Telehealth: Payer: Self-pay | Admitting: Cardiovascular Disease

## 2014-05-30 ENCOUNTER — Ambulatory Visit (INDEPENDENT_AMBULATORY_CARE_PROVIDER_SITE_OTHER): Payer: Medicare Other | Admitting: Surgery

## 2014-05-30 ENCOUNTER — Telehealth: Payer: Self-pay | Admitting: Nurse Practitioner

## 2014-05-30 ENCOUNTER — Ambulatory Visit (HOSPITAL_COMMUNITY): Payer: Medicare Other | Attending: Cardiovascular Disease | Admitting: Cardiology

## 2014-05-30 DIAGNOSIS — I2510 Atherosclerotic heart disease of native coronary artery without angina pectoris: Secondary | ICD-10-CM | POA: Insufficient documentation

## 2014-05-30 DIAGNOSIS — I10 Essential (primary) hypertension: Secondary | ICD-10-CM | POA: Insufficient documentation

## 2014-05-30 DIAGNOSIS — E119 Type 2 diabetes mellitus without complications: Secondary | ICD-10-CM | POA: Diagnosis not present

## 2014-05-30 DIAGNOSIS — I82409 Acute embolism and thrombosis of unspecified deep veins of unspecified lower extremity: Secondary | ICD-10-CM

## 2014-05-30 DIAGNOSIS — I6523 Occlusion and stenosis of bilateral carotid arteries: Secondary | ICD-10-CM | POA: Diagnosis not present

## 2014-05-30 DIAGNOSIS — R0989 Other specified symptoms and signs involving the circulatory and respiratory systems: Secondary | ICD-10-CM | POA: Diagnosis not present

## 2014-05-30 DIAGNOSIS — I2699 Other pulmonary embolism without acute cor pulmonale: Secondary | ICD-10-CM | POA: Diagnosis not present

## 2014-05-30 DIAGNOSIS — Z7901 Long term (current) use of anticoagulants: Secondary | ICD-10-CM

## 2014-05-30 DIAGNOSIS — Z5181 Encounter for therapeutic drug level monitoring: Secondary | ICD-10-CM

## 2014-05-30 LAB — POCT INR: INR: 2.4

## 2014-05-30 NOTE — Progress Notes (Signed)
Carotid duplex performed 

## 2014-05-30 NOTE — Telephone Encounter (Signed)
Walk in pt form- Needs call back asap , took to message Nurse today

## 2014-05-30 NOTE — Telephone Encounter (Signed)
Also, patient questions what it means for his LDH to be elevated (per his oncologist at [facility]) and I advised him to f/u with that doctor regarding that issue.

## 2014-05-30 NOTE — Telephone Encounter (Signed)
I received a note from Barbette Reichmann, HIM who states patient and wife were in office earlier and asked that information be left for Dr. Antionette Char nurse.  I received note as Lauren, RN is not in the office today. Patient called on 4/12 with c/o leg and ankle swelling and today complains that swelling in extremities continues.  Wife states patient did not receive a call back, however patient confirms that he spoke with someone from our office on 4/12.  Patient states chest pain resolved after 2-3 day and has not returned.  He states he was advised on 4/12 to increase Lasix to 40 mg daily for 3 days to see if this decreases swelling.  Patient states he took Lasix 40 mg for one day and the frequent urination made him feel weak, so he stopped.  He states he did note improvement in lower extremity swelling, however it has not fully resolved.  States the swelling in his hands is more intermittent and he does not notice it today.  Patient reports putting his legs up when sitting in the recliner without noticed improvement.  He denies SOB.  I advised patient that he will need to elevate his lower extremities above heart level and that he may try an additional Lasix 10 mg today to see if he notes improvement.  Patient has an appointment on 5/24 with Dr. Burt Knack.  I advised there are no sooner appointments with Dr. Burt Knack but that I am happy to schedule him with one of our APP's for this week or next. Patient states he would like to wait for his appointment with Dr. Burt Knack.  I advised patient that it sounds like the chest pain is unrelated to the extremity swelling and to call back or call EMS if chest pain returns.  Patient verbalized understanding and agreement.

## 2014-05-31 NOTE — Telephone Encounter (Signed)
Agree with plan as outlined. thx

## 2014-06-11 ENCOUNTER — Telehealth: Payer: Self-pay | Admitting: Oncology

## 2014-06-11 ENCOUNTER — Ambulatory Visit (HOSPITAL_BASED_OUTPATIENT_CLINIC_OR_DEPARTMENT_OTHER): Payer: Medicare Other | Admitting: Oncology

## 2014-06-11 VITALS — BP 143/69 | HR 72 | Temp 98.2°F | Resp 18 | Ht 67.0 in | Wt 185.1 lb

## 2014-06-11 DIAGNOSIS — R110 Nausea: Secondary | ICD-10-CM

## 2014-06-11 DIAGNOSIS — R197 Diarrhea, unspecified: Secondary | ICD-10-CM

## 2014-06-11 DIAGNOSIS — C9210 Chronic myeloid leukemia, BCR/ABL-positive, not having achieved remission: Secondary | ICD-10-CM

## 2014-06-11 DIAGNOSIS — Z86711 Personal history of pulmonary embolism: Secondary | ICD-10-CM

## 2014-06-11 DIAGNOSIS — Z86718 Personal history of other venous thrombosis and embolism: Secondary | ICD-10-CM

## 2014-06-11 DIAGNOSIS — C9290 Myeloid leukemia, unspecified, not having achieved remission: Secondary | ICD-10-CM

## 2014-06-11 DIAGNOSIS — R609 Edema, unspecified: Secondary | ICD-10-CM

## 2014-06-11 NOTE — Progress Notes (Signed)
  [facility] OFFICE PROGRESS NOTE   Diagnosis: CML  INTERVAL HISTORY:   Mr. Kwasnik returns as scheduled. He continues [place]. Leg edema has improved with Lasix. He sees Dr. Florene Glen every 3 months. He reports "tearing" muscles at the left hip recently. He is not aware of specific trauma. The pain has improved. He continues Coumadin anticoagulation. No significant nausea. Diarrhea is controlled with Imodium.  Objective:  Vital signs in last 24 hours:  Blood pressure 143/69, pulse 72, temperature 98.2 F (36.8 C), temperature source Oral, resp. rate 18, height 5\' 7"  (1.702 m), weight 185 lb 1.6 oz (83.961 kg), SpO2 99 %.    HEENT: No thrush or ulcers Resp: Lungs clear bilaterally Cardio: Regular rate and rhythm GI: No hepatosplenomegaly Vascular: Trace pitting edema at the low leg bilaterally   Lab Results:  04/24/2014 at [facility] 13.7, platelets 195,000, white count 10.3, ANC 7.6 PCR-negative Medications: I have reviewed the patient's current medications.  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 He sees Dr. Florene Glen for PCR monitoring. 2. Intermittent nausea and diarrhea, likely related to [place]. 3. History of periorbital and extremity edema secondary to [place]. 4. Myasthenia gravis-followed by Dr. Jannifer Franklin 5. Diabetes 6. History of coronary artery disease 7. Deep vein thrombosis/pulmonary embolism in June 2013-now maintained on Coumadin, managed at the [facility]   Disposition:  Mr. Istre continues [place] for treatment of chronic myelogenous leukemia. He is followed closely by Dr. Florene Glen. He will return for an office visit in 8 months. I am available to see him sooner as needed.  Betsy Coder, MD  06/11/2014  12:24 PM

## 2014-06-11 NOTE — Telephone Encounter (Signed)
Gave avs & calendar for January 2017. °

## 2014-06-13 ENCOUNTER — Other Ambulatory Visit: Payer: Self-pay | Admitting: Cardiovascular Disease

## 2014-06-13 DIAGNOSIS — N401 Enlarged prostate with lower urinary tract symptoms: Secondary | ICD-10-CM | POA: Diagnosis not present

## 2014-06-20 DIAGNOSIS — N400 Enlarged prostate without lower urinary tract symptoms: Secondary | ICD-10-CM | POA: Diagnosis not present

## 2014-06-25 ENCOUNTER — Telehealth: Payer: Self-pay | Admitting: Endocrinology

## 2014-06-25 NOTE — Telephone Encounter (Signed)
Received records from [facility] forwarded to Dr. Renato Shin 06/25/14 fbg.

## 2014-06-30 ENCOUNTER — Other Ambulatory Visit: Payer: Self-pay | Admitting: Cardiovascular Disease

## 2014-07-03 ENCOUNTER — Other Ambulatory Visit: Payer: Self-pay

## 2014-07-03 ENCOUNTER — Encounter: Payer: Self-pay | Admitting: Cardiovascular Disease

## 2014-07-03 ENCOUNTER — Ambulatory Visit (INDEPENDENT_AMBULATORY_CARE_PROVIDER_SITE_OTHER): Payer: Medicare Other | Admitting: *Deleted

## 2014-07-03 ENCOUNTER — Ambulatory Visit (INDEPENDENT_AMBULATORY_CARE_PROVIDER_SITE_OTHER): Payer: Medicare Other | Admitting: Cardiovascular Disease

## 2014-07-03 VITALS — BP 140/82 | HR 59 | Ht 67.0 in | Wt 188.8 lb

## 2014-07-03 DIAGNOSIS — I10 Essential (primary) hypertension: Secondary | ICD-10-CM | POA: Diagnosis not present

## 2014-07-03 DIAGNOSIS — Z5181 Encounter for therapeutic drug level monitoring: Secondary | ICD-10-CM

## 2014-07-03 DIAGNOSIS — I6523 Occlusion and stenosis of bilateral carotid arteries: Secondary | ICD-10-CM | POA: Diagnosis not present

## 2014-07-03 DIAGNOSIS — E782 Mixed hyperlipidemia: Secondary | ICD-10-CM

## 2014-07-03 DIAGNOSIS — Z7901 Long term (current) use of anticoagulants: Secondary | ICD-10-CM

## 2014-07-03 DIAGNOSIS — I2510 Atherosclerotic heart disease of native coronary artery without angina pectoris: Secondary | ICD-10-CM

## 2014-07-03 DIAGNOSIS — I82409 Acute embolism and thrombosis of unspecified deep veins of unspecified lower extremity: Secondary | ICD-10-CM

## 2014-07-03 DIAGNOSIS — I2699 Other pulmonary embolism without acute cor pulmonale: Secondary | ICD-10-CM

## 2014-07-03 LAB — BASIC METABOLIC PANEL
BUN: 12 mg/dL (ref 6–23)
CO2: 30 mEq/L (ref 19–32)
Calcium: 9.1 mg/dL (ref 8.4–10.5)
Chloride: 104 mEq/L (ref 96–112)
Creatinine, Ser: 1.29 mg/dL (ref 0.40–1.50)
GFR: 57.53 mL/min — ABNORMAL LOW (ref >60.00)
Glucose, Bld: 127 mg/dL — ABNORMAL HIGH (ref 70–99)
Potassium: 4 mEq/L (ref 3.5–5.1)
Sodium: 140 mEq/L (ref 135–145)

## 2014-07-03 LAB — POCT INR: INR: 1.6

## 2014-07-03 MED ORDER — FUROSEMIDE 20 MG PO TABS: 10.0000 mg | ORAL_TABLET | Freq: Every day | ORAL | Status: DC

## 2014-07-03 MED ORDER — POTASSIUM CHLORIDE CRYS ER 10 MEQ PO TBCR: 10.0000 meq | EXTENDED_RELEASE_TABLET | Freq: Every day | ORAL | Status: DC

## 2014-07-03 NOTE — Patient Instructions (Addendum)
Medication Instructions:  Your physician recommends that you continue on your current medications as directed. Please refer to the Current Medication list given to you today.  Labwork: Your physician recommends that you have lab work today: BMP  Your physician recommends that you return for a FASTING LIPID, LIVER and BMP in 1 YEAR--nothing to eat or drink after midnight, lab opens at 7:30 AM  Testing/Procedures: No new orders.   Follow-Up: Your physician wants you to follow-up in: 1 YEAR with Dr Burt Knack.  You will receive a reminder letter in the mail two months in advance. If you don't receive a letter, please call our office to schedule the follow-up appointment.   Any Other Special Instructions Will Be Listed Below (If Applicable).

## 2014-07-03 NOTE — Progress Notes (Signed)
Cardiology Office Note   Date:  07/03/2014   ID:  [name], DOB [birth year], MRN [id number]  PCP:  Renato Shin, MD  Cardiologist:  Sherren Mocha, MD    Chief Complaint  Patient presents with  . Follow-up    4/6 months     History of Present Illness: [name] is a 74 y.o. male who presents for  Follow-up of coronary artery disease with remote PCI.  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  He reports no symptoms of angina since he was last seen 1 year ago. His complaints include generalized fatigue and mild exertional dyspnea. He developed bilateral leg swelling about 3 months ago. This is improved with low-dose Lasix. An echocardiogram was done to evaluate whether congestive heart failure might be contributing. His echo was essentially normal with the exception of grade 1 diastolic dysfunction. The patient denies orthopnea, PND, or heart palpitations.   Past Medical History  Diagnosis Date  . Coronary atherosclerosis of unspecified type of vessel, native or graft     remote cath in 2003 showing distal and branch vessel CAD with normal LV function; managed medically  . Mixed hyperlipidemia   . Glucose intolerance (impaired glucose tolerance)     history of  . Diverticulosis of colon (without mention of hemorrhage)   . Hx of colonic polyps   . Internal hemorrhoids without mention of complication   . Rash and other nonspecific skin eruption   . Pneumonia, organism unspecified   . Conjunctivitis unspecified   . Myasthenia gravis     uses prednisone  . GERD (gastroesophageal reflux disease)   . Pulmonary embolism June 2013  . DVT (deep venous thrombosis) June 2013  . Chronic anticoagulation   . Diabetes mellitus   . DJD (degenerative joint disease)   . Ptosis   . CML (chronic myeloid leukemia)   . HTN (hypertension)   . IBS (irritable bowel syndrome)   . History of pneumonia   . Dystonia   .  High cholesterol   . Heart disease   . Cancer   . 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 PONV (postoperative nausea and vomiting)   . External hemorrhoids without mention of complication     blood clot left 6/13 with pe  . History of kidney stones   . Tubulovillous adenoma polyp of colon     2010  . HOH (hard of hearing)     hearing aids    Past Surgical History  Procedure Laterality Date  . Angioplasty  2001  . Back surgery      x2, lumbar and cervical  . Tonsillectomy    . Cataract extraction Bilateral 12/12  . Shoulder surgery Left 05/07/2009  . Cardiac catheterization    . Lumbar laminectomy/decompression microdiscectomy Right 09/14/2012    Procedure: Right Lumbar three-four, four-five, Lumbar five-Sacral one decompressive laminectomy;  Surgeon: [name], MD;  Location: [facility];  Service: Neurosurgery;  Laterality: Right;    Current Outpatient Prescriptions  Medication Sig Dispense Refill  . CRESTOR 10 MG tablet TAKE 1/2 TABLET EVERY DAY 45 tablet 1  . furosemide (LASIX) 20 MG tablet Take 0.5 tablets (10 mg total) by mouth daily. 90 tablet 3  . imatinib (GLEEVEC) 400 MG tablet Take 400 mg by mouth daily.     . isosorbide mononitrate (IMDUR) 30 MG 24 hr tablet TAKE 1 TABLET EVERY DAY 90 tablet 2  . loperamide (IMODIUM)  2 MG capsule Take 2 mg by mouth every morning. For diarrhea    . LORazepam (ATIVAN) 0.5 MG tablet Take 0.5 mg by mouth 3 times/day as needed-between meals & bedtime for sleep.    . metFORMIN (GLUCOPHAGE-XR) 500 MG 24 hr tablet TAKE 1 TABLET EVERY DAY IN THE MORNING 90 tablet 0  . metoprolol succinate (TOPROL-XL) 25 MG 24 hr tablet TAKE 1 TABLET EVERY DAY 90 tablet 0  . Multiple Vitamin (MULTIVITAMIN) tablet Take 1 tablet by mouth daily.    Marland Kitchen NEXIUM 40 MG capsule TAKE 1 CAPSULE EVERY MORNING 90 capsule 2  . nitroGLYCERIN (NITROSTAT) 0.4 MG SL tablet Place 1 tablet (0.4 mg total) under the tongue every 5 (five) minutes x 3 doses as needed for  chest pain. 90 tablet 3  . oxyCODONE-acetaminophen (PERCOCET) 10-325 MG per tablet Take 1 tablet by mouth every 4 (four) hours as needed for pain.    . potassium chloride (K-DUR,KLOR-CON) 10 MEQ tablet Take 1 tablet (10 mEq total) by mouth daily. 90 tablet 3  . predniSONE (DELTASONE) 5 MG tablet Take 10 mg by mouth daily with breakfast.    . pyridostigmine (MESTINON) 60 MG tablet TAKE 1 TABLET 3 TIMES A DAY 90 tablet 5  . warfarin (COUMADIN) 5 MG tablet TAKE 1 TABLET EVERY DAY AS DIRECTED 30 tablet 3  . [DISCONTINUED] zolpidem (AMBIEN) 10 MG tablet Take 10 mg by mouth at bedtime as needed.      No current facility-administered medications for this visit.    Allergies:   Oxycodone-acetaminophen and Sulfamethoxazole   Social History:  The patient  reports that he has never smoked. He has never used smokeless tobacco. He reports that he does not drink alcohol or use illicit drugs.   Family History:  The patient's  family history includes Breast cancer in his sister; Clotting disorder in his sister; Dementia in his sister; Heart attack in his father, mother, sister, sister, and sister; Heart disease in his father, mother, and sister; Stroke in his father and mother.    ROS:  Please see the history of present illness.  Otherwise, review of systems is positive for  Leg swelling, hearing loss, diarrhea , muscle pain , rash, easy bruising , nausea.  All other systems are reviewed and negative.    PHYSICAL EXAM: VS:  BP 140/82 mmHg  Pulse 59  Ht 5\' 7"  (1.702 m)  Wt 188 lb 12.8 oz (85.639 kg)  BMI 29.56 kg/m2 , BMI Body mass index is 29.56 kg/(m^2). GEN: Well nourished, well developed, in no acute distress HEENT: normal Neck: no JVD, no masses. No carotid bruits Cardiac: RRR without murmur or gallop                Respiratory:  clear to auscultation bilaterally, normal work of breathing GI: soft, nontender, nondistended, + BS MS: no deformity or atrophy Ext:  1+ pretibial edema  bilaterally Skin: warm and dry, no rash Neuro:  Strength and sensation are intact Psych: euthymic mood, full affect  EKG:  EKG is ordered today. The ekg ordered today shows  Normal sinus rhythm 59 bpm , nonspecific ST abnormality  Recent Labs: 01/02/2014: Magnesium 1.6; TSH 1.090 04/09/2014: ALT 48; Pro B Natriuretic peptide (BNP) 49.0 07/03/2014: BUN 12; Creatinine 1.29; Potassium 4.0; Sodium 140   Lipid Panel     Component Value Date/Time   CHOL 128 04/24/2013 1034   TRIG 205* 04/24/2013 1034   HDL 38* 04/24/2013 1034   CHOLHDL 3.4 04/24/2013 1034  VLDL 41* 04/24/2013 1034   LDLCALC 49 04/24/2013 1034   LDLDIRECT 59.8 10/12/2012 0737     Wt Readings from Last 3 Encounters:  07/03/14 188 lb 12.8 oz (85.639 kg)  06/11/14 185 lb 1.6 oz (83.961 kg)  05/09/14 185 lb 6.4 oz (84.097 kg)     Cardiac Studies Reviewed: 2D Echo 3.4.2016: Study Conclusions  - Left ventricle: The cavity size was normal. There was mild concentric hypertrophy. Systolic function was vigorous. The estimated ejection fraction was in the range of 65% to 70%. Wall motion was normal; there were no regional wall motion abnormalities. Doppler parameters are consistent with abnormal left ventricular relaxation (grade 1 diastolic dysfunction). Doppler parameters are consistent with elevated ventricular end-diastolic filling pressure. - Aortic valve: Trileaflet; normal thickness leaflets. Transvalvular velocity was within the normal range. There was no stenosis. There was no regurgitation. - Mitral valve: Structurally normal valve. - Right ventricle: The cavity size was normal. Wall thickness was normal. Systolic function was normal. - Right atrium: The atrium was normal in size. - Tricuspid valve: Structurally normal valve. There was mild regurgitation. - Pulmonic valve: Structurally normal valve. There was no regurgitation. - Pulmonary arteries: Systolic pressure was within the  normal range. - Inferior vena cava: The vessel was normal in size. - Pericardium, extracardiac: There was no pericardial effusion.  ASSESSMENT AND PLAN: 1.   Coronary artery disease, native vessel, without symptoms of angina: The patient is stable on his current medical program without symptoms. He is not on aspirin because of chronic anticoagulation.  I reviewed his last study from 2003. I do not see any indication for stress testing at this time considering his lack of symptoms.  2. Edema: unclear etiology , this is a potential side effect of some of his medications including prednisone and gleevec.  Also could be related to venous insufficiency. He does not have signs or symptoms of congestive heart failure on physical exam or echo.   3. Essential hypertension: Blood pressure well controlled   4. Hyperlipidemia: patient is on low-dose Crestor. Lipids have been at goal.  Last checked March 2015. LFTs have been checked recently and they're within normal limits. Will put in her recall for blood work before his office visit next year.  Current medicines are reviewed with the patient today.  The patient does not have concerns regarding medicines.  Labs/ tests ordered today include:   Orders Placed This Encounter  Procedures  . Basic Metabolic Panel (BMET)  . Basic Metabolic Panel (BMET)  . Lipid panel  . Hepatic function panel  . EKG 12-Lead   Disposition:   FU one year  Signed, Sherren Mocha, MD  07/03/2014 10:47 PM    [address], [place], [place]  [address] Phone: [phone]; Fax: [phone]

## 2014-07-06 ENCOUNTER — Other Ambulatory Visit: Payer: Self-pay | Admitting: Endocrinology

## 2014-07-06 DIAGNOSIS — H04123 Dry eye syndrome of bilateral lacrimal glands: Secondary | ICD-10-CM | POA: Diagnosis not present

## 2014-07-06 DIAGNOSIS — H26493 Other secondary cataract, bilateral: Secondary | ICD-10-CM | POA: Diagnosis not present

## 2014-07-17 ENCOUNTER — Ambulatory Visit (INDEPENDENT_AMBULATORY_CARE_PROVIDER_SITE_OTHER): Payer: Medicare Other | Admitting: *Deleted

## 2014-07-17 DIAGNOSIS — I2699 Other pulmonary embolism without acute cor pulmonale: Secondary | ICD-10-CM | POA: Diagnosis not present

## 2014-07-17 DIAGNOSIS — Z5181 Encounter for therapeutic drug level monitoring: Secondary | ICD-10-CM | POA: Diagnosis not present

## 2014-07-17 DIAGNOSIS — I82409 Acute embolism and thrombosis of unspecified deep veins of unspecified lower extremity: Secondary | ICD-10-CM

## 2014-07-17 DIAGNOSIS — Z7901 Long term (current) use of anticoagulants: Secondary | ICD-10-CM | POA: Diagnosis not present

## 2014-07-17 LAB — POCT INR: INR: 2.1

## 2014-07-19 ENCOUNTER — Encounter: Payer: Self-pay | Admitting: Endocrinology

## 2014-07-19 ENCOUNTER — Ambulatory Visit (INDEPENDENT_AMBULATORY_CARE_PROVIDER_SITE_OTHER): Payer: Medicare Other | Admitting: Endocrinology

## 2014-07-19 ENCOUNTER — Encounter: Payer: Self-pay | Admitting: *Deleted

## 2014-07-19 ENCOUNTER — Ambulatory Visit
Admission: RE | Admit: 2014-07-19 | Discharge: 2014-07-19 | Disposition: A | Discharge status: Home / Self Care | Payer: Medicare Other | Source: Ambulatory Visit | Attending: Endocrinology | Admitting: Endocrinology

## 2014-07-19 VITALS — BP 122/64 | HR 54 | Temp 98.2°F | Ht 66.0 in | Wt 186.1 lb

## 2014-07-19 DIAGNOSIS — E1159 Type 2 diabetes mellitus with other circulatory complications: Secondary | ICD-10-CM

## 2014-07-19 DIAGNOSIS — Z125 Encounter for screening for malignant neoplasm of prostate: Secondary | ICD-10-CM | POA: Diagnosis not present

## 2014-07-19 DIAGNOSIS — Z0000 Encounter for general adult medical examination without abnormal findings: Secondary | ICD-10-CM | POA: Diagnosis not present

## 2014-07-19 DIAGNOSIS — Z92241 Personal history of systemic steroid therapy: Secondary | ICD-10-CM | POA: Diagnosis not present

## 2014-07-19 DIAGNOSIS — Z13820 Encounter for screening for osteoporosis: Secondary | ICD-10-CM

## 2014-07-19 DIAGNOSIS — I6523 Occlusion and stenosis of bilateral carotid arteries: Secondary | ICD-10-CM | POA: Diagnosis not present

## 2014-07-19 LAB — LIPID PANEL
CHOL/HDL RATIO: 3
CHOLESTEROL: 133 mg/dL (ref 0–200)
HDL: 39.3 mg/dL (ref >39.00)
LDL Cholesterol: 54 mg/dL (ref 0–99)
NONHDL: 93.7
TRIGLYCERIDES: 199 mg/dL — AB (ref 0.0–149.0)
VLDL: 39.8 mg/dL (ref 0.0–40.0)

## 2014-07-19 LAB — TSH: TSH: 1.24 u[IU]/mL (ref 0.35–4.50)

## 2014-07-19 LAB — MICROALBUMIN / CREATININE URINE RATIO
Creatinine,U: 233 mg/dL
Microalb Creat Ratio: 2.3 mg/g (ref 0.0–30.0)
Microalb, Ur: 5.4 mg/dL — ABNORMAL HIGH (ref 0.0–1.9)

## 2014-07-19 LAB — HEMOGLOBIN A1C: HEMOGLOBIN A1C: 5.8 % (ref 4.6–6.5)

## 2014-07-19 NOTE — Patient Instructions (Addendum)
good diet and exercise significantly improve the control of your diabetes.  please let me know if you wish to be referred to a dietician.  high blood sugar is very risky to your health.  you should see an eye doctor and dentist every year.  It is very important to get all recommended vaccinations.  please consider these measures for your health:  minimize alcohol.  do not use tobacco products.  have a colonoscopy at least every 10 years from age 74.   keep firearms safely stored.  always use seat belts.  have working smoke alarms in your home.  see an eye doctor and dentist regularly.  never drive under the influence of alcohol or drugs (including prescription drugs).  those with fair skin should take precautions against the sun. it is critically important to prevent falling down (keep floor areas well-lit, dry, and free of loose objects.  If you have a cane, walker, or wheelchair, you should use it, even for short trips around the house.  Also, try not to rush) Please come back for a follow-up appointment in 6 months.

## 2014-07-19 NOTE — Progress Notes (Signed)
Subjective:    Patient ID: [name], male    DOB: 07/09/1940, 74 y.o.   MRN: [id number]  HPI  The state of at least three ongoing medical problems is addressed today, with interval history of each noted here: Pt returns for f/u of diabetes mellitus: DM type: 2 Dx'ed: 5701 Complications: none Therapy: metformin DKA: never Severe hypoglycemia: never Pancreatitis: never Other: he has never taken insulin; therapy limited by intermittent prednisone for MG Interval history:  Pt returns for f/u of type 2 DM (dx'ed 7793; no known complications; ).  He has been on the increased . He is back on prednisone.  He does not check cbg's.  He denies hypoglycemia. dyslipidemia: he has gained weight.     Hypothyroidism: edema persists. Past Medical History  Diagnosis Date  . Coronary atherosclerosis of unspecified type of vessel, native or graft     remote cath in 2003 showing distal and branch vessel CAD with normal LV function; managed medically  . Mixed hyperlipidemia   . Glucose intolerance (impaired glucose tolerance)     history of  . Diverticulosis of colon (without mention of hemorrhage)   . Hx of colonic polyps   . Internal hemorrhoids without mention of complication   . Rash and other nonspecific skin eruption   . Pneumonia, organism unspecified   . Conjunctivitis unspecified   . Myasthenia gravis     uses prednisone  . GERD (gastroesophageal reflux disease)   . Pulmonary embolism June 2013  . DVT (deep venous thrombosis) June 2013  . Chronic anticoagulation   . Diabetes mellitus   . DJD (degenerative joint disease)   . Ptosis   . CML (chronic myeloid leukemia)   . HTN (hypertension)   . IBS (irritable bowel syndrome)   . History of pneumonia   . Dystonia   . High cholesterol   . Heart disease   . Cancer   . 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 PONV (postoperative nausea and vomiting)   . External hemorrhoids without mention of complication     blood clot  left 6/13 with pe  . History of kidney stones   . Tubulovillous adenoma polyp of colon     2010  . HOH (hard of hearing)     hearing aids    Past Surgical History  Procedure Laterality Date  . Angioplasty  2001  . Back surgery      x2, lumbar and cervical  . Tonsillectomy    . Cataract extraction Bilateral 12/12  . Shoulder surgery Left 05/07/2009  . Cardiac catheterization    . Lumbar laminectomy/decompression microdiscectomy Right 09/14/2012    Procedure: Right Lumbar three-four, four-five, Lumbar five-Sacral one decompressive laminectomy;  Surgeon: [name], MD;  Location: [facility];  Service: Neurosurgery;  Laterality: Right;    History   Social History  . Marital Status: Married    Spouse Name: N/A  . Number of Children: 2  . Years of Education: 1-College   Occupational History  . retired     Retired   Social History Main Topics  . Smoking status: Never Smoker   . Smokeless tobacco: Never Used  . Alcohol Use: No  . Drug Use: No  . Sexual Activity: Not Currently   Other Topics Concern  . Not on file   Social History Narrative   Patient is right handed.   Patient has 1 cup of caffeine daily.    Current Outpatient Prescriptions on File Prior  to Visit  Medication Sig Dispense Refill  . CRESTOR 10 MG tablet TAKE 1/2 TABLET EVERY DAY 45 tablet 1  . furosemide (LASIX) 20 MG tablet Take 0.5 tablets (10 mg total) by mouth daily. 90 tablet 3  . imatinib (GLEEVEC) 400 MG tablet Take 400 mg by mouth daily.     . isosorbide mononitrate (IMDUR) 30 MG 24 hr tablet TAKE 1 TABLET EVERY DAY 90 tablet 2  . loperamide (IMODIUM) 2 MG capsule Take 2 mg by mouth every morning. For diarrhea    . LORazepam (ATIVAN) 0.5 MG tablet Take 0.5 mg by mouth 3 times/day as needed-between meals & bedtime for sleep.    . metFORMIN (GLUCOPHAGE-XR) 500 MG 24 hr tablet TAKE 1 TABLET EVERY DAY IN THE MORNING 90 tablet 0  . metFORMIN (GLUCOPHAGE-XR) 500 MG 24 hr tablet TAKE 1 TABLET EVERY  DAY IN THE MORNING 90 tablet 0  . metoprolol succinate (TOPROL-XL) 25 MG 24 hr tablet TAKE 1 TABLET EVERY DAY 90 tablet 0  . Multiple Vitamin (MULTIVITAMIN) tablet Take 1 tablet by mouth daily.    Marland Kitchen NEXIUM 40 MG capsule TAKE 1 CAPSULE EVERY MORNING 90 capsule 2  . nitroGLYCERIN (NITROSTAT) 0.4 MG SL tablet Place 1 tablet (0.4 mg total) under the tongue every 5 (five) minutes x 3 doses as needed for chest pain. 90 tablet 3  . oxyCODONE-acetaminophen (PERCOCET) 10-325 MG per tablet Take 1 tablet by mouth every 4 (four) hours as needed for pain.    . potassium chloride (K-DUR,KLOR-CON) 10 MEQ tablet Take 1 tablet (10 mEq total) by mouth daily. 90 tablet 3  . predniSONE (DELTASONE) 5 MG tablet Take 10 mg by mouth daily with breakfast.    . pyridostigmine (MESTINON) 60 MG tablet TAKE 1 TABLET 3 TIMES A DAY 90 tablet 5  . warfarin (COUMADIN) 5 MG tablet TAKE 1 TABLET EVERY DAY AS DIRECTED 30 tablet 3  . [DISCONTINUED] zolpidem (AMBIEN) 10 MG tablet Take 10 mg by mouth at bedtime as needed.      No current facility-administered medications on file prior to visit.    Allergies  Allergen Reactions  . Oxycodone-Acetaminophen Other (See Comments)    "too strong"  . Sulfamethoxazole Rash and Itching    Family History  Problem Relation Age of Onset  . Heart disease Mother   . Heart attack Mother   . Stroke Mother   . Heart disease Father   . Heart attack Father   . Stroke Father   . Breast cancer Sister     Twin   . Heart attack Sister   . Dementia Sister   . Heart disease Sister   . Heart attack Sister   . Clotting disorder Sister   . Heart attack Sister     BP 122/64 mmHg  Pulse 54  Temp(Src) 98.2 F (36.8 C) (Oral)  Ht 5\' 6"  (1.676 m)  Wt 186 lb 2 oz (84.426 kg)  BMI 30.06 kg/m2  SpO2 97%   Review of Systems Denies n/v.  He has intermittent diarrhea.      Objective:   Physical Exam VITAL SIGNS:  See vs page GENERAL: no distress Pulses: dorsalis pedis intact bilat.     MSK: no deformity of the feet CV: 1+ bilat leg edema; bilat varicosities. Skin:  no ulcer on the feet.  normal color and temp on the feet. Neuro: sensation is intact to touch on the feet, but decreased from normal.  Lab Results  Component Value Date  TSH 1.24 07/19/2014   THYROIDAB <6 01/02/2014   Lab Results  Component Value Date   HGBA1C 5.8 07/19/2014   Lab Results  Component Value Date   CHOL 133 07/19/2014   HDL 39.30 07/19/2014   LDLCALC 54 07/19/2014   LDLDIRECT 59.8 10/12/2012   TRIG 199.0* 07/19/2014   CHOLHDL 3 07/19/2014      Assessment & Plan:  Hypothyroidism: well-replaced DM: well-controlled Dyslipidemia: well-controlled   Please continue the same medications   Subjective:   Patient here for Medicare annual wellness visit and management of other chronic and acute problems.     Risk factors: advanced age    70 of Physicians Providing Medical Care to Patient:  See "snapshot"   Activities of Daily Living: In your present state of health, do you have any difficulty performing the following activities?:  Preparing food and eating?: No  Bathing yourself: No  Getting dressed: No  Using the toilet:No  Moving around from place to place: No  In the past year have you fallen or had a near fall?: No    Home Safety: Has smoke detector and wears seat belts. Firearms are safely stored. No excess sun exposure.  Diet and Exercise  Current exercise habits: pt says good Dietary issues discussed: pt reports a healthy diet   Depression Screen  Q1: Over the past two weeks, have you felt down, depressed or hopeless? no  Q2: Over the past two weeks, have you felt little interest or pleasure in doing things? no   The following portions of the patient's history were reviewed and updated as appropriate: allergies, current medications, past family history, past medical history, past social history, past surgical history and problem list.   Review of Systems  No  change in chronic hearing loss and visual loss Objective:   Vision:  Advertising account executive.   Hearing: grossly normal Body mass index:  See vs page Msk: pt easily and quickly performs "get-up-and-go" from a sitting position Cognitive Impairment Assessment: cognition, memory and judgment appear normal.  remembers 3/3 at 5 minutes.  excellent recall.  can easily read and write a sentence.  alert and oriented x 3.    Assessment:   Medicare wellness utd on preventive parameters    Plan:   During the course of the visit the patient was educated and counseled about appropriate screening and preventive services including:        Fall prevention   Screening mammography  Bone densitometry screening  Diabetes screening  Nutrition counseling   Vaccines / LABS Zostavax / Pneumococcal Vaccine  today  PSA  Patient Instructions (the written plan) was given to the patient.

## 2014-07-23 DIAGNOSIS — H01005 Unspecified blepharitis left lower eyelid: Secondary | ICD-10-CM | POA: Diagnosis not present

## 2014-07-24 DIAGNOSIS — D720 Genetic anomalies of leukocytes: Secondary | ICD-10-CM | POA: Diagnosis not present

## 2014-07-24 DIAGNOSIS — M25559 Pain in unspecified hip: Secondary | ICD-10-CM | POA: Diagnosis not present

## 2014-07-24 DIAGNOSIS — C9211 Chronic myeloid leukemia, BCR/ABL-positive, in remission: Secondary | ICD-10-CM | POA: Diagnosis not present

## 2014-07-24 DIAGNOSIS — D539 Nutritional anemia, unspecified: Secondary | ICD-10-CM | POA: Diagnosis not present

## 2014-07-24 DIAGNOSIS — Z9889 Other specified postprocedural states: Secondary | ICD-10-CM | POA: Diagnosis not present

## 2014-07-24 DIAGNOSIS — K222 Esophageal obstruction: Secondary | ICD-10-CM | POA: Diagnosis not present

## 2014-07-24 DIAGNOSIS — G7000 Myasthenia gravis without (acute) exacerbation: Secondary | ICD-10-CM | POA: Diagnosis not present

## 2014-07-24 DIAGNOSIS — K118 Other diseases of salivary glands: Secondary | ICD-10-CM | POA: Diagnosis not present

## 2014-07-24 DIAGNOSIS — I2699 Other pulmonary embolism without acute cor pulmonale: Secondary | ICD-10-CM | POA: Diagnosis not present

## 2014-08-01 ENCOUNTER — Other Ambulatory Visit: Payer: Self-pay | Admitting: Neurology

## 2014-08-01 ENCOUNTER — Other Ambulatory Visit: Payer: Self-pay | Admitting: Endocrinology

## 2014-08-03 DIAGNOSIS — H578 Other specified disorders of eye and adnexa: Secondary | ICD-10-CM | POA: Diagnosis not present

## 2014-08-03 DIAGNOSIS — E11321 Type 2 diabetes mellitus with mild nonproliferative diabetic retinopathy with macular edema: Secondary | ICD-10-CM | POA: Diagnosis not present

## 2014-08-07 ENCOUNTER — Ambulatory Visit (INDEPENDENT_AMBULATORY_CARE_PROVIDER_SITE_OTHER)
Admission: RE | Admit: 2014-08-07 | Discharge: 2014-08-07 | Disposition: A | Discharge status: Home / Self Care | Payer: Medicare Other | Source: Ambulatory Visit | Attending: Endocrinology | Admitting: Endocrinology

## 2014-08-07 ENCOUNTER — Ambulatory Visit (INDEPENDENT_AMBULATORY_CARE_PROVIDER_SITE_OTHER): Payer: Medicare Other

## 2014-08-07 DIAGNOSIS — Z5181 Encounter for therapeutic drug level monitoring: Secondary | ICD-10-CM | POA: Diagnosis not present

## 2014-08-07 DIAGNOSIS — I82409 Acute embolism and thrombosis of unspecified deep veins of unspecified lower extremity: Secondary | ICD-10-CM

## 2014-08-07 DIAGNOSIS — Z13820 Encounter for screening for osteoporosis: Secondary | ICD-10-CM

## 2014-08-07 DIAGNOSIS — I2699 Other pulmonary embolism without acute cor pulmonale: Secondary | ICD-10-CM | POA: Diagnosis not present

## 2014-08-07 DIAGNOSIS — Z7901 Long term (current) use of anticoagulants: Secondary | ICD-10-CM

## 2014-08-07 DIAGNOSIS — H578 Other specified disorders of eye and adnexa: Secondary | ICD-10-CM | POA: Diagnosis not present

## 2014-08-07 LAB — POCT INR: INR: 2.3

## 2014-08-09 ENCOUNTER — Encounter: Payer: Self-pay | Admitting: Gastroenterology

## 2014-08-16 DIAGNOSIS — H40043 Steroid responder, bilateral: Secondary | ICD-10-CM | POA: Diagnosis not present

## 2014-09-01 ENCOUNTER — Other Ambulatory Visit: Payer: Self-pay | Admitting: Cardiovascular Disease

## 2014-09-04 ENCOUNTER — Ambulatory Visit (INDEPENDENT_AMBULATORY_CARE_PROVIDER_SITE_OTHER): Payer: Medicare Other | Admitting: *Deleted

## 2014-09-04 DIAGNOSIS — Z5181 Encounter for therapeutic drug level monitoring: Secondary | ICD-10-CM | POA: Diagnosis not present

## 2014-09-04 DIAGNOSIS — Z7901 Long term (current) use of anticoagulants: Secondary | ICD-10-CM | POA: Diagnosis not present

## 2014-09-04 DIAGNOSIS — I2699 Other pulmonary embolism without acute cor pulmonale: Secondary | ICD-10-CM

## 2014-09-04 DIAGNOSIS — I82409 Acute embolism and thrombosis of unspecified deep veins of unspecified lower extremity: Secondary | ICD-10-CM | POA: Diagnosis not present

## 2014-09-04 LAB — POCT INR: INR: 2.4

## 2014-09-06 DIAGNOSIS — H40043 Steroid responder, bilateral: Secondary | ICD-10-CM | POA: Diagnosis not present

## 2014-09-12 ENCOUNTER — Telehealth: Payer: Self-pay | Admitting: Endocrinology

## 2014-09-12 DIAGNOSIS — H9190 Unspecified hearing loss, unspecified ear: Secondary | ICD-10-CM

## 2014-09-12 NOTE — Telephone Encounter (Signed)
Pt needs referral to ENT on [address] for hearing test on next Tuesday

## 2014-09-12 NOTE — Telephone Encounter (Signed)
See note below   Thanks

## 2014-09-12 NOTE — Telephone Encounter (Signed)
done

## 2014-09-14 ENCOUNTER — Other Ambulatory Visit: Payer: Self-pay | Admitting: Cardiovascular Disease

## 2014-09-17 ENCOUNTER — Telehealth: Payer: Self-pay | Admitting: Endocrinology

## 2014-09-17 DIAGNOSIS — H9190 Unspecified hearing loss, unspecified ear: Secondary | ICD-10-CM

## 2014-09-17 NOTE — Telephone Encounter (Signed)
I contacted the pt and left a voicemail advising the referral had been competed. Pt was also advised we have not received any feedback stating the appointment had been canceled. Could you verify if the appointment has been cancelled?

## 2014-09-17 NOTE — Telephone Encounter (Signed)
Patient called states that his appointment was canceled at [facility] due to a referral not being sent   Patient would like a call from Dr. Cordelia Pen office   Thank you

## 2014-09-18 ENCOUNTER — Telehealth: Payer: Self-pay

## 2014-09-18 NOTE — Telephone Encounter (Signed)
See below

## 2014-09-18 NOTE — Telephone Encounter (Signed)
DX for audiology referral needs to say audiogram/hearing evaluation. Can this change be made? Pt has an appointment at 10 am today. Thanks!

## 2014-09-18 NOTE — Telephone Encounter (Signed)
Spoke with Dorian Pod at [facility] and she stated that she told the pt that all he needed to do was call [place] office and just have them fax over the referral he put in the system.The [facility] referral was not necessary for me to do.I have faxed over the referral twice and Dorian Pod stated that she would call Mr.Aikman to set him up another appt.

## 2014-09-18 NOTE — Telephone Encounter (Signed)
i have resent referral

## 2014-09-21 DIAGNOSIS — H40043 Steroid responder, bilateral: Secondary | ICD-10-CM | POA: Diagnosis not present

## 2014-09-28 DIAGNOSIS — S62186A Nondisplaced fracture of trapezoid [smaller multangular], unspecified wrist, initial encounter for closed fracture: Secondary | ICD-10-CM | POA: Diagnosis not present

## 2014-10-02 ENCOUNTER — Ambulatory Visit (INDEPENDENT_AMBULATORY_CARE_PROVIDER_SITE_OTHER): Payer: Medicare Other | Admitting: *Deleted

## 2014-10-02 DIAGNOSIS — Z5181 Encounter for therapeutic drug level monitoring: Secondary | ICD-10-CM

## 2014-10-02 DIAGNOSIS — I82409 Acute embolism and thrombosis of unspecified deep veins of unspecified lower extremity: Secondary | ICD-10-CM

## 2014-10-02 DIAGNOSIS — Z7901 Long term (current) use of anticoagulants: Secondary | ICD-10-CM | POA: Diagnosis not present

## 2014-10-02 DIAGNOSIS — I2699 Other pulmonary embolism without acute cor pulmonale: Secondary | ICD-10-CM | POA: Diagnosis not present

## 2014-10-02 LAB — POCT INR: INR: 1.9

## 2014-10-12 ENCOUNTER — Other Ambulatory Visit: Payer: Self-pay

## 2014-10-12 MED ORDER — GLUCOSE BLOOD VI STRP: ORAL_STRIP | Status: DC

## 2014-10-24 DIAGNOSIS — J069 Acute upper respiratory infection, unspecified: Secondary | ICD-10-CM | POA: Diagnosis not present

## 2014-10-24 DIAGNOSIS — J189 Pneumonia, unspecified organism: Secondary | ICD-10-CM | POA: Diagnosis not present

## 2014-10-24 DIAGNOSIS — R062 Wheezing: Secondary | ICD-10-CM | POA: Diagnosis not present

## 2014-10-30 ENCOUNTER — Ambulatory Visit (INDEPENDENT_AMBULATORY_CARE_PROVIDER_SITE_OTHER): Payer: Medicare Other

## 2014-10-30 DIAGNOSIS — I82409 Acute embolism and thrombosis of unspecified deep veins of unspecified lower extremity: Secondary | ICD-10-CM

## 2014-10-30 DIAGNOSIS — Z5181 Encounter for therapeutic drug level monitoring: Secondary | ICD-10-CM

## 2014-10-30 DIAGNOSIS — I2699 Other pulmonary embolism without acute cor pulmonale: Secondary | ICD-10-CM

## 2014-10-30 DIAGNOSIS — Z7901 Long term (current) use of anticoagulants: Secondary | ICD-10-CM | POA: Diagnosis not present

## 2014-10-30 LAB — POCT INR: INR: 2.1

## 2014-11-02 DIAGNOSIS — G7000 Myasthenia gravis without (acute) exacerbation: Secondary | ICD-10-CM | POA: Diagnosis not present

## 2014-11-02 DIAGNOSIS — R221 Localized swelling, mass and lump, neck: Secondary | ICD-10-CM | POA: Diagnosis not present

## 2014-11-02 DIAGNOSIS — R05 Cough: Secondary | ICD-10-CM | POA: Diagnosis not present

## 2014-11-02 DIAGNOSIS — C9211 Chronic myeloid leukemia, BCR/ABL-positive, in remission: Secondary | ICD-10-CM | POA: Diagnosis not present

## 2014-11-02 DIAGNOSIS — M810 Age-related osteoporosis without current pathological fracture: Secondary | ICD-10-CM | POA: Diagnosis not present

## 2014-11-02 DIAGNOSIS — Z7952 Long term (current) use of systemic steroids: Secondary | ICD-10-CM | POA: Diagnosis not present

## 2014-11-02 DIAGNOSIS — G709 Myoneural disorder, unspecified: Secondary | ICD-10-CM | POA: Diagnosis not present

## 2014-11-02 DIAGNOSIS — R5383 Other fatigue: Secondary | ICD-10-CM | POA: Diagnosis not present

## 2014-11-02 DIAGNOSIS — R110 Nausea: Secondary | ICD-10-CM | POA: Diagnosis not present

## 2014-11-02 DIAGNOSIS — Z7901 Long term (current) use of anticoagulants: Secondary | ICD-10-CM | POA: Diagnosis not present

## 2014-11-02 DIAGNOSIS — Z9889 Other specified postprocedural states: Secondary | ICD-10-CM | POA: Diagnosis not present

## 2014-11-02 DIAGNOSIS — D649 Anemia, unspecified: Secondary | ICD-10-CM | POA: Diagnosis not present

## 2014-11-02 DIAGNOSIS — D72819 Decreased white blood cell count, unspecified: Secondary | ICD-10-CM | POA: Diagnosis not present

## 2014-11-02 DIAGNOSIS — Z79899 Other long term (current) drug therapy: Secondary | ICD-10-CM | POA: Diagnosis not present

## 2014-11-02 DIAGNOSIS — M25559 Pain in unspecified hip: Secondary | ICD-10-CM | POA: Diagnosis not present

## 2014-11-02 DIAGNOSIS — I2699 Other pulmonary embolism without acute cor pulmonale: Secondary | ICD-10-CM | POA: Diagnosis not present

## 2014-11-02 DIAGNOSIS — Z79891 Long term (current) use of opiate analgesic: Secondary | ICD-10-CM | POA: Diagnosis not present

## 2014-11-06 ENCOUNTER — Telehealth: Payer: Self-pay | Admitting: *Deleted

## 2014-11-06 NOTE — Telephone Encounter (Signed)
Patient called & stated he started Augmentin 825mg  for 10days on 11/02/14 and that his PCP discontinued his Ceftin at that time.  Also, he stated he will be taking Mucinex 600mg  for 4 days.  Advised that the medications are safe to take and to call back with any other medications changes and he verbalized understanding.

## 2014-11-08 ENCOUNTER — Encounter: Payer: Self-pay | Admitting: Neurology

## 2014-11-08 ENCOUNTER — Ambulatory Visit (INDEPENDENT_AMBULATORY_CARE_PROVIDER_SITE_OTHER): Payer: Medicare Other | Admitting: Neurology

## 2014-11-08 VITALS — BP 138/77 | HR 56 | Ht 67.0 in | Wt 185.5 lb

## 2014-11-08 DIAGNOSIS — G7000 Myasthenia gravis without (acute) exacerbation: Secondary | ICD-10-CM | POA: Diagnosis not present

## 2014-11-08 DIAGNOSIS — I6523 Occlusion and stenosis of bilateral carotid arteries: Secondary | ICD-10-CM

## 2014-11-08 MED ORDER — PYRIDOSTIGMINE BROMIDE 60 MG PO TABS: 60.0000 mg | ORAL_TABLET | Freq: Three times a day (TID) | ORAL | Status: DC

## 2014-11-08 MED ORDER — PREDNISONE 1 MG PO TABS: ORAL_TABLET | ORAL | Status: DC

## 2014-11-08 MED ORDER — PREDNISONE 5 MG PO TABS: 5.0000 mg | ORAL_TABLET | Freq: Every day | ORAL | Status: DC

## 2014-11-08 NOTE — Patient Instructions (Signed)
   We will slowly taper the prednisone by one mg every 6 weeks until you get to 5 mg daily. Call if you have any worsening of symptoms with the taper.   Myasthenia Gravis Myasthenia gravis is a disease that causes muscle weakness throughout the body. The muscles affected are the ones we can control (voluntary muscles). An example of a voluntary muscle is your hand muscles. You can control the muscles to make the hand pick something up. An example of an involuntary muscle is the heart. The heart beats without any direction from you.  Myasthenia Gravis is thought to be an autoimmune disease. That means that normal defenses of the body begin to attack the body. In this case, the immune system begins to attack cells located at the junctions of the muscles and the nerves. Women are affected more often. Women are affected at a younger age than men. Babies born to affected women frequently develop symptoms at an early age. SYMPTOMS Initially in the disease, the facial muscles are affected first. After this, a person may develop droopy eyelids. They may have difficulty controlling facial muscles. They may have problems chewing. Swallowing and speaking may become impaired. The weakness gradually spreads to the arms and legs. It begins to affect breathing. Sometimes, the symptoms lessen or go away without any apparent cause. DIAGNOSIS  Diagnosis can be made with blood tests. Tests such as electromyography may be done to examine the electrical activity in the muscle. An improvement in symptoms after having an anti-cholinesterase drug helps confirm the diagnosis.  TREATMENT  Medicines are usually prescribed as the first treatment. These medicines help, but they do not cure the disease. A plasma cleansing procedure (plasmapheresis) can be used to treat a crisis. It can also be used to prepare a person for surgery. This procedure produces short-term improvement. Some cases are helped by removing the thymus gland.  Steroids are used for short-term benefits. Document Released: 05/04/2000 Document Revised: 04/20/2011 Document Reviewed: 03/29/2013 [facility] Patient Information 2015 [place], [place]. This information is not intended to replace advice given to you by your health care provider. Make sure you discuss any questions you have with your health care provider.

## 2014-11-08 NOTE — Progress Notes (Signed)
Reason for visit: Myasthenia gravis  [name] is an 74 y.o. male  History of present illness:  [name] is a 74 year old white male with a history of myasthenia gravis primarily with ocular features. The patient has CML with a chronic low-grade anemia. He recently had an episode of pneumonia, and his prednisone dose was doubled to 20 mg daily, he has now tapered back down to 10 mg daily. He has not had any double vision, ptosis, or difficulty with chewing. He does report some episodes on occasion where things get stuck in his throat. He denies any reflux of liquids through the nose. He has not had any change in speech pattern. The patient takes Mestinon, he tolerates the medication well. He returns to this office for an evaluation. He is now being treated for glaucoma.  Past Medical History  Diagnosis Date  . Coronary atherosclerosis of unspecified type of vessel, native or graft     remote cath in 2003 showing distal and branch vessel CAD with normal LV function; managed medically  . Mixed hyperlipidemia   . Glucose intolerance (impaired glucose tolerance)     history of  . Diverticulosis of colon (without mention of hemorrhage)   . Hx of colonic polyps   . Internal hemorrhoids without mention of complication   . Rash and other nonspecific skin eruption   . Pneumonia, organism unspecified   . Conjunctivitis unspecified   . Myasthenia gravis     uses prednisone  . GERD (gastroesophageal reflux disease)   . Pulmonary embolism June 2013  . DVT (deep venous thrombosis) June 2013  . Chronic anticoagulation   . Diabetes mellitus   . DJD (degenerative joint disease)   . Ptosis   . CML (chronic myeloid leukemia)   . HTN (hypertension)   . IBS (irritable bowel syndrome)   . History of pneumonia   . Dystonia   . High cholesterol   . Heart disease   . Cancer   . 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 PONV (postoperative nausea and vomiting)   . External hemorrhoids  without mention of complication     blood clot left 6/13 with pe  . History of kidney stones   . Tubulovillous adenoma polyp of colon     2010  . HOH (hard of hearing)     hearing aids    Past Surgical History  Procedure Laterality Date  . Angioplasty  2001  . Back surgery      x2, lumbar and cervical  . Tonsillectomy    . Cataract extraction Bilateral 12/12  . Shoulder surgery Left 05/07/2009  . Cardiac catheterization    . Lumbar laminectomy/decompression microdiscectomy Right 09/14/2012    Procedure: Right Lumbar three-four, four-five, Lumbar five-Sacral one decompressive laminectomy;  Surgeon: [name], MD;  Location: [place] [facility];  Service: Neurosurgery;  Laterality: Right;    Family History  Problem Relation Age of Onset  . Heart disease Mother   . Heart attack Mother   . Stroke Mother   . Heart disease Father   . Heart attack Father   . Stroke Father   . Breast cancer Sister     Twin   . Heart attack Sister   . Dementia Sister   . Heart disease Sister   . Heart attack Sister   . Clotting disorder Sister   . Heart attack Sister     Social history:  reports that he has never smoked. He has  never used smokeless tobacco. He reports that he does not drink alcohol or use illicit drugs.    Allergies  Allergen Reactions  . Oxycodone-Acetaminophen Other (See Comments)    "too strong"  . Sulfamethoxazole Rash and Itching    Medications:  Prior to Admission medications   Medication Sig Start Date End Date Taking? Authorizing Provider  amoxicillin-clavulanate (AUGMENTIN) 875-125 MG tablet Take 1 tablet by mouth 2 (two) times daily.   Yes Historical Provider, MD  brimonidine-timolol (COMBIGAN) 0.2-0.5 % ophthalmic solution Place 1 drop into both eyes every 12 (twelve) hours.   Yes Historical Provider, MD  CRESTOR 10 MG tablet TAKE 1/2 TABLET EVERY DAY 06/14/14  Yes Sherren Mocha, MD  furosemide (LASIX) 20 MG tablet Take 0.5 tablets (10 mg total) by mouth daily.  07/03/14  Yes Sherren Mocha, MD  glucose blood (ACCU-CHEK AVIVA) test strip Use to check blood sugar 1 time per day 10/12/14  Yes Renato Shin, MD  imatinib (GLEEVEC) 400 MG tablet Take 400 mg by mouth daily.    Yes Historical Provider, MD  isosorbide mononitrate (IMDUR) 30 MG 24 hr tablet TAKE 1 TABLET EVERY DAY 09/04/14  Yes Sherren Mocha, MD  loperamide (IMODIUM) 2 MG capsule Take 2 mg by mouth every morning. For diarrhea   Yes Historical Provider, MD  LORazepam (ATIVAN) 0.5 MG tablet Take 0.5 mg by mouth 3 times/day as needed-between meals & bedtime for sleep.   Yes Historical Provider, MD  metFORMIN (GLUCOPHAGE-XR) 500 MG 24 hr tablet TAKE 1 TABLET EVERY DAY IN THE MORNING 01/02/14  Yes Renato Shin, MD  metoprolol succinate (TOPROL-XL) 25 MG 24 hr tablet TAKE 1 TABLET EVERY DAY 09/04/14  Yes Sherren Mocha, MD  Multiple Vitamin (MULTIVITAMIN) tablet Take 1 tablet by mouth daily.   Yes Historical Provider, MD  NEXIUM 40 MG capsule TAKE 1 CAPSULE EVERY MORNING 01/31/14  Yes Inda Castle, MD  nitroGLYCERIN (NITROSTAT) 0.4 MG SL tablet Place 1 tablet (0.4 mg total) under the tongue every 5 (five) minutes x 3 doses as needed for chest pain. 05/22/14  Yes Jerline Pain, MD  oxyCODONE-acetaminophen (PERCOCET) 10-325 MG per tablet Take 1 tablet by mouth every 4 (four) hours as needed for pain.   Yes Historical Provider, MD  potassium chloride (K-DUR,KLOR-CON) 10 MEQ tablet Take 1 tablet (10 mEq total) by mouth daily. 07/03/14  Yes Sherren Mocha, MD  predniSONE (DELTASONE) 5 MG tablet Take 2 tablets (10 mg total) by mouth daily. 08/01/14  Yes Kathrynn Ducking, MD  pyridostigmine (MESTINON) 60 MG tablet TAKE 1 TABLET 3 TIMES A DAY 12/17/13  Yes Kathrynn Ducking, MD  warfarin (COUMADIN) 5 MG tablet TAKE 1 TABLET EVERY DAY AS DIRECTED 09/14/14  Yes Sherren Mocha, MD    ROS:  Out of a complete 14 system review of symptoms, the patient complains only of the following symptoms, and all other reviewed systems  are negative.  Hearing loss Eye redness  Blood pressure 138/77, pulse 56, height 5\' 7"  (1.702 m), weight 185 lb 8 oz (84.142 kg).  Physical Exam  General: The patient is alert and cooperative at the time of the examination. The patient is moderately obese.  Skin: No significant peripheral edema is noted.   Neurologic Exam  Mental status: The patient is alert and oriented x 3 at the time of the examination. The patient has apparent normal recent and remote memory, with an apparently normal attention span and concentration ability.   Cranial nerves: Facial symmetry is present.  Speech is normal, no aphasia or dysarthria is noted. Extraocular movements are full. Visual fields are full. With superior gaze for 1 minute, no ptosis or divergence of gaze is noted. No subjective double vision was noted.  Motor: The patient has good strength in all 4 extremities. With arms outstretched 1 minute, no fatigable weakness of the deltoid muscles was noted.  Sensory examination: Soft touch sensation is symmetric on the face, arms, and legs.  Coordination: The patient has good finger-nose-finger and heel-to-shin bilaterally.  Gait and station: The patient has a normal gait. Tandem gait is slightly unsteady. Romberg is negative. No drift is seen.  Reflexes: Deep tendon reflexes are symmetric.   Assessment/Plan:  1. Myasthenia gravis with ocular features  The patient will be tapered down some on the prednisone, he will be given a prescription for the 5 mg prednisone tablets, taking 1 a day. He will be given a prescription for the 1 mg prednisone tablets, starting at 4 tablets daily, tapering by one tablet every 6 weeks until off. He will continue the Mestinon, a prescription was called in for this. He will follow-up in 6 months. He will call me if his symptoms worsen with the prednisone taper.  Jill Alexanders MD 11/08/2014 7:22 PM  [facility] [address] [place], [place] [address]  Phone [phone] Fax [phone]

## 2014-11-14 DIAGNOSIS — H40043 Steroid responder, bilateral: Secondary | ICD-10-CM | POA: Diagnosis not present

## 2014-11-16 ENCOUNTER — Ambulatory Visit (INDEPENDENT_AMBULATORY_CARE_PROVIDER_SITE_OTHER): Payer: Medicare Other

## 2014-11-16 DIAGNOSIS — Z23 Encounter for immunization: Secondary | ICD-10-CM | POA: Diagnosis not present

## 2014-11-27 DIAGNOSIS — L578 Other skin changes due to chronic exposure to nonionizing radiation: Secondary | ICD-10-CM | POA: Diagnosis not present

## 2014-11-27 DIAGNOSIS — L739 Follicular disorder, unspecified: Secondary | ICD-10-CM | POA: Diagnosis not present

## 2014-11-27 DIAGNOSIS — L821 Other seborrheic keratosis: Secondary | ICD-10-CM | POA: Diagnosis not present

## 2014-11-27 DIAGNOSIS — L820 Inflamed seborrheic keratosis: Secondary | ICD-10-CM | POA: Diagnosis not present

## 2014-11-27 DIAGNOSIS — L570 Actinic keratosis: Secondary | ICD-10-CM | POA: Diagnosis not present

## 2014-11-28 ENCOUNTER — Ambulatory Visit (INDEPENDENT_AMBULATORY_CARE_PROVIDER_SITE_OTHER): Payer: Medicare Other | Admitting: Pharmacist

## 2014-11-28 DIAGNOSIS — Z7901 Long term (current) use of anticoagulants: Secondary | ICD-10-CM

## 2014-11-28 DIAGNOSIS — I2699 Other pulmonary embolism without acute cor pulmonale: Secondary | ICD-10-CM | POA: Diagnosis not present

## 2014-11-28 DIAGNOSIS — Z5181 Encounter for therapeutic drug level monitoring: Secondary | ICD-10-CM | POA: Diagnosis not present

## 2014-11-28 DIAGNOSIS — H903 Sensorineural hearing loss, bilateral: Secondary | ICD-10-CM | POA: Diagnosis not present

## 2014-11-28 LAB — POCT INR: INR: 1.9

## 2014-11-29 DIAGNOSIS — S62502A Fracture of unspecified phalanx of left thumb, initial encounter for closed fracture: Secondary | ICD-10-CM | POA: Diagnosis not present

## 2014-11-30 DIAGNOSIS — S60012A Contusion of left thumb without damage to nail, initial encounter: Secondary | ICD-10-CM | POA: Diagnosis not present

## 2014-12-04 ENCOUNTER — Telehealth: Payer: Self-pay | Admitting: Endocrinology

## 2014-12-04 DIAGNOSIS — I10 Essential (primary) hypertension: Secondary | ICD-10-CM

## 2014-12-04 NOTE — Telephone Encounter (Signed)
i need reclast form Also, in order to do, we need labs.  Please come in to be drawn

## 2014-12-04 NOTE — Telephone Encounter (Signed)
Left voicemail for the patient to call back and scheduled lab appointment.

## 2014-12-04 NOTE — Telephone Encounter (Signed)
See note below to be advised. 

## 2014-12-04 NOTE — Telephone Encounter (Signed)
Pt is in need of reclast once we get the dial a flows in

## 2014-12-13 ENCOUNTER — Other Ambulatory Visit: Payer: Self-pay | Admitting: Cardiovascular Disease

## 2014-12-19 ENCOUNTER — Other Ambulatory Visit (INDEPENDENT_AMBULATORY_CARE_PROVIDER_SITE_OTHER): Payer: Medicare Other

## 2014-12-19 DIAGNOSIS — I10 Essential (primary) hypertension: Secondary | ICD-10-CM | POA: Diagnosis not present

## 2014-12-19 LAB — BASIC METABOLIC PANEL
BUN: 12 mg/dL (ref 6–23)
CALCIUM: 9.3 mg/dL (ref 8.4–10.5)
CO2: 28 meq/L (ref 19–32)
CREATININE: 1.33 mg/dL (ref 0.40–1.50)
Chloride: 103 mEq/L (ref 96–112)
GFR: 55.47 mL/min — AB (ref >60.00)
GLUCOSE: 125 mg/dL — AB (ref 70–99)
Potassium: 4 mEq/L (ref 3.5–5.1)
SODIUM: 139 meq/L (ref 135–145)

## 2014-12-21 DIAGNOSIS — S60012D Contusion of left thumb without damage to nail, subsequent encounter: Secondary | ICD-10-CM | POA: Diagnosis not present

## 2014-12-25 DIAGNOSIS — H02849 Edema of unspecified eye, unspecified eyelid: Secondary | ICD-10-CM | POA: Diagnosis not present

## 2014-12-25 DIAGNOSIS — H1132 Conjunctival hemorrhage, left eye: Secondary | ICD-10-CM | POA: Diagnosis not present

## 2014-12-26 ENCOUNTER — Telehealth: Payer: Self-pay | Admitting: *Deleted

## 2014-12-26 ENCOUNTER — Ambulatory Visit (INDEPENDENT_AMBULATORY_CARE_PROVIDER_SITE_OTHER): Payer: Medicare Other | Admitting: *Deleted

## 2014-12-26 DIAGNOSIS — I2699 Other pulmonary embolism without acute cor pulmonale: Secondary | ICD-10-CM

## 2014-12-26 DIAGNOSIS — Z7901 Long term (current) use of anticoagulants: Secondary | ICD-10-CM | POA: Diagnosis not present

## 2014-12-26 DIAGNOSIS — Z5181 Encounter for therapeutic drug level monitoring: Secondary | ICD-10-CM

## 2014-12-26 LAB — POCT INR: INR: 1.6

## 2014-12-26 NOTE — Telephone Encounter (Signed)
-----   Message from Sherren Mocha, MD sent at 12/26/2014 12:02 PM EST ----- Reviewed chart. He's taking furosemide 10 mg daily. Would increase to 20 mg daily. thx ----- Message -----    From: Marcos Eke, RN    Sent: 12/26/2014  11:29 AM      To: Barkley Boards, RN, Sherren Mocha, MD  Patient was seen in the CVRR clinic today and he stated that he saw the optometrist (Dr. [facility] in [place]) on yesterday-12/25/14.  He went to the optometrist because his eyes were red.  His eyes were both red and the optometrist told him that his eye exam was normal (20/25) and pressure in his eyes were good, however he had more swelling around his eyes-more in right versus left and recommended that he advise of this to see if you would recommend an increase in his Lasix. In the Coumadin Clinic- his ankles were swollen and eyes were reddened.  Please advise the patient & Thank you.

## 2014-12-26 NOTE — Telephone Encounter (Signed)
Called to the inform the patient to increase the Lasix to 20mg  daily starting today as stated by Dr. Burt Knack & patient verbalized understanding.

## 2014-12-28 ENCOUNTER — Other Ambulatory Visit: Payer: Self-pay

## 2014-12-28 ENCOUNTER — Other Ambulatory Visit: Payer: Self-pay | Admitting: Endocrinology

## 2014-12-28 DIAGNOSIS — E782 Mixed hyperlipidemia: Secondary | ICD-10-CM

## 2014-12-28 DIAGNOSIS — I2510 Atherosclerotic heart disease of native coronary artery without angina pectoris: Secondary | ICD-10-CM

## 2014-12-28 DIAGNOSIS — I10 Essential (primary) hypertension: Secondary | ICD-10-CM

## 2014-12-28 MED ORDER — FUROSEMIDE 20 MG PO TABS: 20.0000 mg | ORAL_TABLET | Freq: Every day | ORAL | Status: DC

## 2015-01-09 ENCOUNTER — Ambulatory Visit (INDEPENDENT_AMBULATORY_CARE_PROVIDER_SITE_OTHER): Payer: Medicare Other | Admitting: Pharmacist

## 2015-01-09 DIAGNOSIS — I2699 Other pulmonary embolism without acute cor pulmonale: Secondary | ICD-10-CM

## 2015-01-09 DIAGNOSIS — Z7901 Long term (current) use of anticoagulants: Secondary | ICD-10-CM

## 2015-01-09 DIAGNOSIS — Z5181 Encounter for therapeutic drug level monitoring: Secondary | ICD-10-CM | POA: Diagnosis not present

## 2015-01-09 LAB — POCT INR: INR: 1.8

## 2015-01-17 ENCOUNTER — Telehealth: Payer: Self-pay | Admitting: Cardiovascular Disease

## 2015-01-17 ENCOUNTER — Encounter: Payer: Self-pay | Admitting: Endocrinology

## 2015-01-17 ENCOUNTER — Ambulatory Visit (INDEPENDENT_AMBULATORY_CARE_PROVIDER_SITE_OTHER): Payer: Medicare Other | Admitting: Endocrinology

## 2015-01-17 ENCOUNTER — Ambulatory Visit
Admission: RE | Admit: 2015-01-17 | Discharge: 2015-01-17 | Disposition: A | Discharge status: Home / Self Care | Payer: Medicare Other | Source: Ambulatory Visit | Attending: Endocrinology | Admitting: Endocrinology

## 2015-01-17 VITALS — BP 126/82 | HR 73 | Temp 98.3°F | Ht 66.0 in | Wt 185.0 lb

## 2015-01-17 DIAGNOSIS — I6523 Occlusion and stenosis of bilateral carotid arteries: Secondary | ICD-10-CM | POA: Diagnosis not present

## 2015-01-17 DIAGNOSIS — R059 Cough, unspecified: Secondary | ICD-10-CM

## 2015-01-17 DIAGNOSIS — R05 Cough: Secondary | ICD-10-CM

## 2015-01-17 DIAGNOSIS — E1159 Type 2 diabetes mellitus with other circulatory complications: Secondary | ICD-10-CM | POA: Diagnosis not present

## 2015-01-17 LAB — POCT GLYCOSYLATED HEMOGLOBIN (HGB A1C): Hemoglobin A1C: 5.5

## 2015-01-17 MED ORDER — AZITHROMYCIN 500 MG PO TABS: 500.0000 mg | ORAL_TABLET | Freq: Every day | ORAL | Status: DC

## 2015-01-17 NOTE — Telephone Encounter (Signed)
New Message  Pt c/o medication issue:  1. Name of Medication: Azithromycin  4. What is your medication issue? Pt requests a call back to determine this medication will interfere with the coumadin. Please call

## 2015-01-17 NOTE — Telephone Encounter (Signed)
LMOM for pt.  His last INR was 1.8.  He has an appt tomorrow.  Will recheck INR at that time.  Continue his normal dose for now.

## 2015-01-17 NOTE — Progress Notes (Signed)
Subjective:    Patient ID: [name], male    DOB: 12-06-1940, 74 y.o.   MRN: [id number]  HPI The state of at least three ongoing medical problems is addressed today, with interval history of each noted here: Pt returns for f/u of diabetes mellitus: DM type: 2 Dx'ed: 0000000 Complications: bilat carotid stenoses.  Therapy: metformin DKA: never Severe hypoglycemia: never Pancreatitis: never Other: he has never taken insulin; therapy limited by intermittent prednisone for MG Interval history: Pt states 5 days of moderate prod-quality cough in the chest, and assoc fever.  Current prednisone is 6 mg qd.   Past Medical History  Diagnosis Date  . Coronary atherosclerosis of unspecified type of vessel, native or graft     remote cath in 2003 showing distal and branch vessel CAD with normal LV function; managed medically  . Mixed hyperlipidemia   . Glucose intolerance (impaired glucose tolerance)     history of  . Diverticulosis of colon (without mention of hemorrhage)   . Hx of colonic polyps   . Internal hemorrhoids without mention of complication   . Rash and other nonspecific skin eruption   . Pneumonia, organism unspecified   . Conjunctivitis unspecified   . Myasthenia gravis ([place])     uses prednisone  . GERD (gastroesophageal reflux disease)   . Pulmonary embolism [facility]) June 2013  . DVT (deep venous thrombosis) [facility]) June 2013  . Chronic anticoagulation   . Diabetes mellitus ([place])   . DJD (degenerative joint disease)   . Ptosis   . CML (chronic myeloid leukemia) ([place])   . HTN (hypertension)   . IBS (irritable bowel syndrome)   . History of pneumonia   . Dystonia   . High cholesterol   . Heart disease   . Cancer ([place])   . 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 PONV (postoperative nausea and vomiting)   . External hemorrhoids without mention of complication     blood clot left 6/13 with pe  . History of kidney stones   . Tubulovillous adenoma polyp of colon       2010  . HOH (hard of hearing)     hearing aids    Past Surgical History  Procedure Laterality Date  . Angioplasty  2001  . Back surgery      x2, lumbar and cervical  . Tonsillectomy    . Cataract extraction Bilateral 12/12  . Shoulder surgery Left 05/07/2009  . Cardiac catheterization    . Lumbar laminectomy/decompression microdiscectomy Right 09/14/2012    Procedure: Right Lumbar three-four, four-five, Lumbar five-Sacral one decompressive laminectomy;  Surgeon: [name], MD;  Location: [place] [facility];  Service: Neurosurgery;  Laterality: Right;    Social History   Social History  . Marital Status: Married    Spouse Name: N/A  . Number of Children: 2  . Years of Education: 1-College   Occupational History  . retired     Retired   Social History Main Topics  . Smoking status: Never Smoker   . Smokeless tobacco: Never Used  . Alcohol Use: No  . Drug Use: No  . Sexual Activity: Not Currently   Other Topics Concern  . Not on file   Social History Narrative   Patient is right handed.   Patient has 2-3 cups of caffeine weekly.    Current Outpatient Prescriptions on File Prior to Visit  Medication Sig Dispense Refill  . brimonidine-timolol (COMBIGAN) 0.2-0.5 % ophthalmic solution Place 1  drop into both eyes every 12 (twelve) hours.    Marland Kitchen glucose blood (ACCU-CHEK AVIVA) test strip Use to check blood sugar 1 time per day 100 each 2  . imatinib (GLEEVEC) 400 MG tablet Take 400 mg by mouth daily.     . isosorbide mononitrate (IMDUR) 30 MG 24 hr tablet TAKE 1 TABLET EVERY DAY 90 tablet 3  . loperamide (IMODIUM) 2 MG capsule Take 2 mg by mouth every morning. For diarrhea    . LORazepam (ATIVAN) 0.5 MG tablet Take 0.5 mg by mouth 3 times/day as needed-between meals & bedtime for sleep.    . metFORMIN (GLUCOPHAGE-XR) 500 MG 24 hr tablet TAKE 1 TABLET EVERY DAY IN THE MORNING 90 tablet 0  . metoprolol succinate (TOPROL-XL) 25 MG 24 hr tablet TAKE 1 TABLET EVERY DAY 90 tablet  3  . Multiple Vitamin (MULTIVITAMIN) tablet Take 1 tablet by mouth daily.    Marland Kitchen NEXIUM 40 MG capsule TAKE 1 CAPSULE EVERY MORNING 90 capsule 2  . nitroGLYCERIN (NITROSTAT) 0.4 MG SL tablet Place 1 tablet (0.4 mg total) under the tongue every 5 (five) minutes x 3 doses as needed for chest pain. 90 tablet 3  . potassium chloride (K-DUR,KLOR-CON) 10 MEQ tablet Take 1 tablet (10 mEq total) by mouth daily. 90 tablet 3  . predniSONE (DELTASONE) 1 MG tablet Begin 4 tablets a day, taper by one tablet every 6 weeks until off 360 tablet 1  . predniSONE (DELTASONE) 5 MG tablet Take 1 tablet (5 mg total) by mouth daily. 90 tablet 1  . pyridostigmine (MESTINON) 60 MG tablet Take 1 tablet (60 mg total) by mouth 3 (three) times daily. 90 tablet 5  . rosuvastatin (CRESTOR) 10 MG tablet Take 0.5 tablets (5 mg total) by mouth daily. 45 tablet 1  . warfarin (COUMADIN) 5 MG tablet TAKE 1 TABLET EVERY DAY AS DIRECTED 30 tablet 3  . [DISCONTINUED] zolpidem (AMBIEN) 10 MG tablet Take 10 mg by mouth at bedtime as needed.      No current facility-administered medications on file prior to visit.    Allergies  Allergen Reactions  . Oxycodone-Acetaminophen Other (See Comments)    "too strong"  . Sulfamethoxazole Rash and Itching    Family History  Problem Relation Age of Onset  . Heart disease Mother   . Heart attack Mother   . Stroke Mother   . Heart disease Father   . Heart attack Father   . Stroke Father   . Breast cancer Sister     Twin   . Heart attack Sister   . Dementia Sister   . Heart disease Sister   . Heart attack Sister   . Clotting disorder Sister   . Heart attack Sister     BP 126/82 mmHg  Pulse 73  Temp(Src) 98.3 F (36.8 C) (Oral)  Ht 5\' 6"  (1.676 m)  Wt 185 lb (83.915 kg)  BMI 29.87 kg/m2  SpO2 96%   Review of Systems Denies wheezing and sob.     Objective:   Physical Exam VITAL SIGNS:  See vs page.   GENERAL: no distress.   head: no deformity.   eyes: no periorbital  swelling, no proptosis.   external nose and ears are normal.   mouth: no lesion seen.  Both eac's and tm's are normal.  LUNGS:  Clear to auscultation, except for a few rales at both bases.     Lab Results  Component Value Date   HGBA1C 5.5 01/17/2015  CXR: NAD    Assessment & Plan:  Acute bronchitis, new DM: well-controlled DVT: there is a possible interaction between zithromax and coumadin  Patient is advised the following: Patient Instructions  A chest x-ray is requested for you today.  We'll let you know about the results.   I have sent a prescription to your pharmacy, for an antibiotic pill.  I hope we can treat this in the office.  However, go to the ER if you feel worse.  I hope you feel better soon.  If you don't feel better by next week, please call back.   Please continue the same metformin.   Skip the furosemide until you feels better. Please see coumadin clinic tomorrow, as the antibiotic can interact with your coumadin.

## 2015-01-17 NOTE — Patient Instructions (Addendum)
A chest x-ray is requested for you today.  We'll let you know about the results.   I have sent a prescription to your pharmacy, for an antibiotic pill.  I hope we can treat this in the office.  However, go to the ER if you feel worse.  I hope you feel better soon.  If you don't feel better by next week, please call back.   Please continue the same metformin.   Skip the furosemide until you feels better. Please see coumadin clinic tomorrow, as the antibiotic can interact with your coumadin.

## 2015-01-18 ENCOUNTER — Ambulatory Visit: Payer: Medicare Other | Admitting: Endocrinology

## 2015-01-18 ENCOUNTER — Ambulatory Visit (INDEPENDENT_AMBULATORY_CARE_PROVIDER_SITE_OTHER): Payer: Medicare Other | Admitting: Pharmacist

## 2015-01-18 DIAGNOSIS — Z7901 Long term (current) use of anticoagulants: Secondary | ICD-10-CM

## 2015-01-18 DIAGNOSIS — I2699 Other pulmonary embolism without acute cor pulmonale: Secondary | ICD-10-CM | POA: Diagnosis not present

## 2015-01-18 DIAGNOSIS — S60012D Contusion of left thumb without damage to nail, subsequent encounter: Secondary | ICD-10-CM | POA: Diagnosis not present

## 2015-01-18 DIAGNOSIS — Z5181 Encounter for therapeutic drug level monitoring: Secondary | ICD-10-CM

## 2015-01-18 LAB — POCT INR: INR: 2.8

## 2015-01-25 DIAGNOSIS — Z7984 Long term (current) use of oral hypoglycemic drugs: Secondary | ICD-10-CM | POA: Diagnosis not present

## 2015-01-25 DIAGNOSIS — Z79899 Other long term (current) drug therapy: Secondary | ICD-10-CM | POA: Diagnosis not present

## 2015-01-25 DIAGNOSIS — C9211 Chronic myeloid leukemia, BCR/ABL-positive, in remission: Secondary | ICD-10-CM | POA: Diagnosis not present

## 2015-01-25 DIAGNOSIS — Z7901 Long term (current) use of anticoagulants: Secondary | ICD-10-CM | POA: Diagnosis not present

## 2015-01-25 DIAGNOSIS — C9210 Chronic myeloid leukemia, BCR/ABL-positive, not having achieved remission: Secondary | ICD-10-CM | POA: Diagnosis not present

## 2015-01-29 ENCOUNTER — Ambulatory Visit (INDEPENDENT_AMBULATORY_CARE_PROVIDER_SITE_OTHER): Payer: Medicare Other | Admitting: Physician Assistant

## 2015-01-29 ENCOUNTER — Telehealth: Payer: Self-pay | Admitting: Gastroenterology

## 2015-01-29 ENCOUNTER — Encounter: Payer: Self-pay | Admitting: Physician Assistant

## 2015-01-29 VITALS — BP 130/72 | HR 66 | Ht 65.75 in | Wt 186.8 lb

## 2015-01-29 DIAGNOSIS — R110 Nausea: Secondary | ICD-10-CM | POA: Diagnosis not present

## 2015-01-29 DIAGNOSIS — K219 Gastro-esophageal reflux disease without esophagitis: Secondary | ICD-10-CM | POA: Diagnosis not present

## 2015-01-29 DIAGNOSIS — B370 Candidal stomatitis: Secondary | ICD-10-CM | POA: Diagnosis not present

## 2015-01-29 DIAGNOSIS — I6523 Occlusion and stenosis of bilateral carotid arteries: Secondary | ICD-10-CM | POA: Diagnosis not present

## 2015-01-29 DIAGNOSIS — K3184 Gastroparesis: Secondary | ICD-10-CM

## 2015-01-29 MED ORDER — METOCLOPRAMIDE HCL 5 MG/5ML PO SOLN: 5.0000 mg | Freq: Two times a day (BID) | ORAL | Status: DC

## 2015-01-29 MED ORDER — NYSTATIN 100000 UNIT/ML MT SUSP: 5.0000 mL | Freq: Four times a day (QID) | OROMUCOSAL | Status: DC

## 2015-01-29 MED ORDER — ESOMEPRAZOLE MAGNESIUM 40 MG PO CPDR: 40.0000 mg | DELAYED_RELEASE_CAPSULE | Freq: Two times a day (BID) | ORAL | Status: DC

## 2015-01-29 NOTE — Patient Instructions (Signed)
We have sent the following medications to your pharmacy for you to pick up at your convenience:Reglan, Mycostatin and Nexium.   If you develop any side effects from taking Reglan such as tremors, twitching or nervousness ect. please STOP taking and call the office.    Please call the office to schedule a follow up with dr Havery Moros [phone]  Gastroesophageal Reflux Disease, Adult Normally, food travels down the esophagus and stays in the stomach to be digested. However, when a person has gastroesophageal reflux disease (GERD), food and stomach acid move back up into the esophagus. When this happens, the esophagus becomes sore and inflamed. Over time, GERD can create small holes (ulcers) in the lining of the esophagus.  CAUSES This condition is caused by a problem with the muscle between the esophagus and the stomach (lower esophageal sphincter, or LES). Normally, the LES muscle closes after food passes through the esophagus to the stomach. When the LES is weakened or abnormal, it does not close properly, and that allows food and stomach acid to go back up into the esophagus. The LES can be weakened by certain dietary substances, medicines, and medical conditions, including:  Tobacco use.  Pregnancy.  Having a hiatal hernia.  Heavy alcohol use.  Certain foods and beverages, such as coffee, chocolate, onions, and peppermint. RISK FACTORS This condition is more likely to develop in:  People who have an increased body weight.  People who have connective tissue disorders.  People who use NSAID medicines. SYMPTOMS Symptoms of this condition include:  Heartburn.  Difficult or painful swallowing.  The feeling of having a lump in the throat.  Abitter taste in the mouth.  Bad breath.  Having a large amount of saliva.  Having an upset or bloated stomach.  Belching.  Chest pain.  Shortness of breath or wheezing.  Ongoing (chronic) cough or a night-time cough.  Wearing  away of tooth enamel.  Weight loss. Different conditions can cause chest pain. Make sure to see your health care provider if you experience chest pain. DIAGNOSIS Your health care provider will take a medical history and perform a physical exam. To determine if you have mild or severe GERD, your health care provider may also monitor how you respond to treatment. You may also have other tests, including:  An endoscopy toexamine your stomach and esophagus with a small camera.  A test thatmeasures the acidity level in your esophagus.  A test thatmeasures how much pressure is on your esophagus.  A barium swallow or modified barium swallow to show the shape, size, and functioning of your esophagus. TREATMENT The goal of treatment is to help relieve your symptoms and to prevent complications. Treatment for this condition may vary depending on how severe your symptoms are. Your health care provider may recommend:  Changes to your diet.  Medicine.  Surgery. HOME CARE INSTRUCTIONS Diet  Follow a diet as recommended by your health care provider. This may involve avoiding foods and drinks such as:  Coffee and tea (with or without caffeine).  Drinks that containalcohol.  Energy drinks and sports drinks.  Carbonated drinks or sodas.  Chocolate and cocoa.  Peppermint and mint flavorings.  Garlic and onions.  Horseradish.  Spicy and acidic foods, including peppers, chili powder, curry powder, vinegar, hot sauces, and barbecue sauce.  Citrus fruit juices and citrus fruits, such as oranges, lemons, and limes.  Tomato-based foods, such as red sauce, chili, salsa, and pizza with red sauce.  Fried and fatty foods, such as  donuts, french fries, potato chips, and high-fat dressings.  High-fat meats, such as hot dogs and fatty cuts of red and white meats, such as rib eye steak, sausage, ham, and bacon.  High-fat dairy items, such as whole milk, butter, and cream cheese.  Eat small,  frequent meals instead of large meals.  Avoid drinking large amounts of liquid with your meals.  Avoid eating meals during the 2-3 hours before bedtime.  Avoid lying down right after you eat.  Do not exercise right after you eat. General Instructions  Pay attention to any changes in your symptoms.  Take over-the-counter and prescription medicines only as told by your health care provider. Do not take aspirin, ibuprofen, or other NSAIDs unless your health care provider told you to do so.  Do not use any tobacco products, including cigarettes, chewing tobacco, and e-cigarettes. If you need help quitting, ask your health care provider.  Wear loose-fitting clothing. Do not wear anything tight around your waist that causes pressure on your abdomen.  Raise (elevate) the head of your bed 6 inches (15cm).  Try to reduce your stress, such as with yoga or meditation. If you need help reducing stress, ask your health care provider.  If you are overweight, reduce your weight to an amount that is healthy for you. Ask your health care provider for guidance about a safe weight loss goal.  Keep all follow-up visits as told by your health care provider. This is important. SEEK MEDICAL CARE IF:  You have new symptoms.  You have unexplained weight loss.  You have difficulty swallowing, or it hurts to swallow.  You have wheezing or a persistent cough.  Your symptoms do not improve with treatment.  You have a hoarse voice. SEEK IMMEDIATE MEDICAL CARE IF:  You have pain in your arms, neck, jaw, teeth, or back.  You feel sweaty, dizzy, or light-headed.  You have chest pain or shortness of breath.  You vomit and your vomit looks like blood or coffee grounds.  You faint.  Your stool is bloody or black.  You cannot swallow, drink, or eat.   This information is not intended to replace advice given to you by your health care provider. Make sure you discuss any questions you have with  your health care provider.   Document Released: 11/05/2004 Document Revised: 10/17/2014 Document Reviewed: 05/23/2014 Elsevier Interactive Patient Education [employer].

## 2015-01-29 NOTE — Telephone Encounter (Signed)
Former [employer] patient. Would like to establish with Dr Havery Moros if possible. He states he has gastroparesis. Having GI pain. Appointment scheduled.

## 2015-01-30 ENCOUNTER — Encounter: Payer: Self-pay | Admitting: Physician Assistant

## 2015-01-30 NOTE — Progress Notes (Signed)
Patient ID: [name], male   DOB: [birth year], 74 y.o.   MRN: [id number]     History of Present Illness: [name] is a 74 year old male who has previously been followed by Dr. Deatra Ina. 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 He has a history of myasthenia gravis which has primarily affected his eyes. He has been on Nexium 40 mg twice daily. Abdominal ultrasound in April 2015 showed no gallstones or wall thickening. Common bile duct was 4.6 mm. No focal liver lesions identified, parenchymal echogenicity was iswithin normal limits. The visualized portion of the pancreas was unremarkable and the spleen appeared normal in size. Gastric emptying scan in May 2007 showed abnormally delayed gastric emptying. Hepatobiliary scan with ejection fraction in May 2007 showed a normal gallbladder ejection fraction.  Polyps presents with epigastric pain that comes and goes. It is present both on an empty stomach and after meals. He states he feels queasy all the time. He has been burping a lot and is very gassy. He has no dysphagia. He states his vomitus is very acidic and he has a burning in his throat. He feels as if he has a "speed bump" when he swallows. He feels full after 2 bites. When he belches it often smells very bad. He has frequent nocturnal regurgitation. Recently he has had some discomfort on his tongue as well.  Colonoscopy was done in November 2015 and he was noted to have moderate diverticulosis but an otherwise normal exam. He was advised that no surveillance would be warranted due to his age. His last EGD was in March 2014 with [facility] dilation of a distal esophageal stricture.   Past Medical History  Diagnosis Date  . Coronary atherosclerosis of unspecified type of vessel, native  or graft     remote cath in 2003 showing distal and branch vessel CAD with normal LV function; managed medically  . Mixed hyperlipidemia   . Glucose intolerance (impaired glucose tolerance)     history of  . Diverticulosis of colon (without mention of hemorrhage)   . Hx of colonic polyps   . Internal hemorrhoids without mention of complication   . Rash and other nonspecific skin eruption   . Pneumonia, organism unspecified   . Conjunctivitis unspecified   . Myasthenia gravis ([place])     uses prednisone  . GERD (gastroesophageal reflux disease)   . Pulmonary embolism [facility]) June 2013  . DVT (deep venous thrombosis) [facility]) June 2013  . Chronic anticoagulation   . Diabetes mellitus ([place])   . DJD (degenerative joint disease)   . Ptosis   . CML (chronic myeloid leukemia) ([place])   . HTN (hypertension)   . IBS (irritable bowel syndrome)   . History of pneumonia   . Dystonia   . High cholesterol   . Heart disease   . Cancer ([place])   . 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 PONV (postoperative nausea and vomiting)   . External hemorrhoids without mention of complication     blood clot left 6/13 with pe  . History of kidney stones   . Tubulovillous adenoma polyp of colon     2010  . HOH (hard of hearing)     hearing aids  . Osteoporosis 2016    Past Surgical History  Procedure Laterality  Date  . Angioplasty  2001  . Back surgery      x2, lumbar and cervical  . Tonsillectomy    . Cataract extraction Bilateral 12/12  . Shoulder surgery Left 05/07/2009  . Cardiac catheterization    . Lumbar laminectomy/decompression microdiscectomy Right 09/14/2012    Procedure: Right Lumbar three-four, four-five, Lumbar five-Sacral one decompressive laminectomy;  Surgeon: [name], MD;  Location: [facility];  Service: Neurosurgery;  Laterality: Right;   Family History  Problem Relation Age of Onset  . Heart disease Mother   . Heart attack Mother   . Stroke Mother   . Heart disease  Father   . Heart attack Father   . Stroke Father   . Breast cancer Sister     Twin   . Heart attack Sister   . Dementia Sister   . Heart disease Sister   . Heart attack Sister   . Clotting disorder Sister   . Heart attack Sister   . Colon cancer Neg Hx    Social History  Substance Use Topics  . Smoking status: Never Smoker   . Smokeless tobacco: Never Used  . Alcohol Use: No   Current Outpatient Prescriptions  Medication Sig Dispense Refill  . brimonidine-timolol (COMBIGAN) 0.2-0.5 % ophthalmic solution Place 1 drop into both eyes every 12 (twelve) hours.    Marland Kitchen glucose blood (ACCU-CHEK AVIVA) test strip Use to check blood sugar 1 time per day 100 each 2  . imatinib (GLEEVEC) 400 MG tablet Take 400 mg by mouth daily.     . isosorbide mononitrate (IMDUR) 30 MG 24 hr tablet TAKE 1 TABLET EVERY DAY 90 tablet 3  . loperamide (IMODIUM) 2 MG capsule Take 2 mg by mouth every morning. For diarrhea    . LORazepam (ATIVAN) 0.5 MG tablet Take 0.5 mg by mouth 3 times/day as needed-between meals & bedtime for sleep.    . metFORMIN (GLUCOPHAGE-XR) 500 MG 24 hr tablet TAKE 1 TABLET EVERY DAY IN THE MORNING 90 tablet 0  . metoprolol succinate (TOPROL-XL) 25 MG 24 hr tablet TAKE 1 TABLET EVERY DAY 90 tablet 3  . Multiple Vitamin (MULTIVITAMIN) tablet Take 1 tablet by mouth daily.    Marland Kitchen NEXIUM 40 MG capsule TAKE 1 CAPSULE EVERY MORNING 90 capsule 2  . nitroGLYCERIN (NITROSTAT) 0.4 MG SL tablet Place 1 tablet (0.4 mg total) under the tongue every 5 (five) minutes x 3 doses as needed for chest pain. 90 tablet 3  . potassium chloride (K-DUR,KLOR-CON) 10 MEQ tablet Take 1 tablet (10 mEq total) by mouth daily. (Patient taking differently: Take 10 mEq by mouth. Take one twice a week) 90 tablet 3  . predniSONE (DELTASONE) 1 MG tablet Begin 4 tablets a day, taper by one tablet every 6 weeks until off 360 tablet 1  . predniSONE (DELTASONE) 5 MG tablet Take 1 tablet (5 mg total) by mouth daily. 90 tablet 1  .  pyridostigmine (MESTINON) 60 MG tablet Take 1 tablet (60 mg total) by mouth 3 (three) times daily. 90 tablet 5  . rosuvastatin (CRESTOR) 10 MG tablet Take 0.5 tablets (5 mg total) by mouth daily. 45 tablet 1  . warfarin (COUMADIN) 5 MG tablet TAKE 1 TABLET EVERY DAY AS DIRECTED 30 tablet 3  . esomeprazole (NEXIUM) 40 MG capsule Take 1 capsule (40 mg total) by mouth 2 (two) times daily before a meal. 180 capsule 1  . metoCLOPramide (REGLAN) 5 MG/5ML solution Take 5 mLs (5 mg total) by  mouth 2 (two) times daily. Take 2 times a day before breakfast and at bedtime. 300 mL 2  . nystatin (MYCOSTATIN) 100000 UNIT/ML suspension Take 5 mLs (500,000 Units total) by mouth 4 (four) times daily. Swish and swallow 5cc four times a day for 10 days. 200 mL 1  . [DISCONTINUED] zolpidem (AMBIEN) 10 MG tablet Take 10 mg by mouth at bedtime as needed.      No current facility-administered medications for this visit.   Allergies  Allergen Reactions  . Oxycodone-Acetaminophen Other (See Comments)    "too strong"  . Sulfamethoxazole Rash and Itching     Review of Systems: Per history of present illness otherwise negative.   Studies:   Dg Chest 2 View  01/17/2015  CLINICAL DATA:  Productive cough. EXAM: CHEST  2 VIEW COMPARISON:  06/18/2013 FINDINGS: No cardiomegaly when accounting for apical fat pad. Stable aortic tortuosity. There is no edema, consolidation, effusion, or pneumothorax. No acute osseous finding. IMPRESSION: No evidence of active cardiopulmonary disease. Electronically Signed   By: Monte Fantasia M.D.   On: 01/17/2015 13:43     Physical Exam: BP 130/72 mmHg  Pulse 66  Ht 5' 5.75" (1.67 m)  Wt 186 lb 12.8 oz (84.732 kg)  BMI 30.38 kg/m2 General: Pleasant, well developed ,male in no acute distress Head: Normocephalic and atraumatic, oropharynx is noted to have a yellow/white coating on the mid to posterior tongue with no obvious plaques in the pupil mucosa. Eyes:  sclerae anicteric,  conjunctiva pink , am slight drooping of his eyelids bilaterally. Ears: Normal auditory acuity Lungs: Clear throughout to auscultation Heart: Regular rate and rhythm Abdomen: Soft, non distended, non-tender. No masses, no hepatomegaly. Normal bowel sounds Musculoskeletal: Symmetrical with no gross deformities  Extremities: No edema  Neurological: Alert oriented x 4, grossly nonfocal Psychological:  Alert and cooperative. Normal mood and affect  Assessment and Recommendations:  74 year old male with a history of CML on Gleevec, coronary artery disease, diverticulosis, chronic GERD, gastroparesis, esophageal strictures, DVT and pulmonary emboli on chronic Coumadin, diabetes, and myasthenia, presenting with nausea, and epigastric discomfort. May be due to poorly controlled GERD or an exacerbation of his gastroparesis. He will continue Nexium 40 mg twice daily. He will be given a short trial of Reglan syrup 5 mg per 5 ML's. He was advised to take 5 ML's before breakfast and at bedtime, for 2 weeks only. He was instructed if he has tremor, twitching, anxiety, while on the Reglan, he should discontinue it immediately and let us know. If he finds the Reglan helps his symptoms he may be considered for chronic dose at at bedtime. He's been instructed to adhere to small-volume frequent low-fat meals. An antireflux regimen is also been reviewed. A CBC, basic metabolic panel, hepatic function panel and lipase will be obtained. For his thrush, he will be given a trial of Mycostatin oral suspension, swish and swallow 5 mL 4 times daily for 10 days. He will follow up in 6 weeks, at which time he would like to establish care with Dr. Havery Moros.       [name], Deloris Ping 01/30/2015,

## 2015-01-31 NOTE — Progress Notes (Signed)
Agree with assessment and plan as outlined.  

## 2015-02-06 ENCOUNTER — Telehealth: Payer: Self-pay | Admitting: Gastroenterology

## 2015-02-06 NOTE — Telephone Encounter (Signed)
Patient is scheduled for Dr. Havery Moros for 03/07/15 10:00.  He is notified

## 2015-02-07 ENCOUNTER — Other Ambulatory Visit: Payer: Self-pay

## 2015-02-07 ENCOUNTER — Ambulatory Visit (HOSPITAL_COMMUNITY)
Admission: RE | Admit: 2015-02-07 | Discharge: 2015-02-07 | Disposition: A | Discharge status: Home / Self Care | Payer: Medicare Other | Source: Ambulatory Visit | Attending: Cardiovascular Disease | Admitting: Cardiovascular Disease

## 2015-02-07 ENCOUNTER — Ambulatory Visit (INDEPENDENT_AMBULATORY_CARE_PROVIDER_SITE_OTHER): Payer: Medicare Other | Admitting: Pharmacist

## 2015-02-07 ENCOUNTER — Other Ambulatory Visit: Payer: Self-pay | Admitting: Neurology

## 2015-02-07 DIAGNOSIS — I10 Essential (primary) hypertension: Secondary | ICD-10-CM | POA: Diagnosis not present

## 2015-02-07 DIAGNOSIS — I2699 Other pulmonary embolism without acute cor pulmonale: Secondary | ICD-10-CM

## 2015-02-07 DIAGNOSIS — Z5181 Encounter for therapeutic drug level monitoring: Secondary | ICD-10-CM

## 2015-02-07 DIAGNOSIS — E119 Type 2 diabetes mellitus without complications: Secondary | ICD-10-CM | POA: Insufficient documentation

## 2015-02-07 DIAGNOSIS — Z7901 Long term (current) use of anticoagulants: Secondary | ICD-10-CM

## 2015-02-07 DIAGNOSIS — E782 Mixed hyperlipidemia: Secondary | ICD-10-CM | POA: Diagnosis not present

## 2015-02-07 DIAGNOSIS — R600 Localized edema: Secondary | ICD-10-CM

## 2015-02-07 DIAGNOSIS — M79604 Pain in right leg: Secondary | ICD-10-CM | POA: Diagnosis not present

## 2015-02-07 LAB — POCT INR: INR: 2.7

## 2015-02-08 ENCOUNTER — Telehealth: Payer: Self-pay | Admitting: Oncology

## 2015-02-08 NOTE — Telephone Encounter (Signed)
Patient left message on voicemail 12/28 to cx 1/12 f/u and he would call back at a later date to r/s.

## 2015-02-15 DIAGNOSIS — M25571 Pain in right ankle and joints of right foot: Secondary | ICD-10-CM | POA: Diagnosis not present

## 2015-02-21 ENCOUNTER — Ambulatory Visit: Payer: Medicare Other | Admitting: Oncology

## 2015-02-21 DIAGNOSIS — M25571 Pain in right ankle and joints of right foot: Secondary | ICD-10-CM | POA: Diagnosis not present

## 2015-02-21 DIAGNOSIS — R293 Abnormal posture: Secondary | ICD-10-CM | POA: Diagnosis not present

## 2015-02-21 DIAGNOSIS — R2681 Unsteadiness on feet: Secondary | ICD-10-CM | POA: Diagnosis not present

## 2015-02-21 DIAGNOSIS — H40043 Steroid responder, bilateral: Secondary | ICD-10-CM | POA: Diagnosis not present

## 2015-02-21 DIAGNOSIS — M7661 Achilles tendinitis, right leg: Secondary | ICD-10-CM | POA: Diagnosis not present

## 2015-02-21 DIAGNOSIS — M79604 Pain in right leg: Secondary | ICD-10-CM | POA: Diagnosis not present

## 2015-02-21 DIAGNOSIS — M25471 Effusion, right ankle: Secondary | ICD-10-CM | POA: Diagnosis not present

## 2015-02-21 DIAGNOSIS — R2689 Other abnormalities of gait and mobility: Secondary | ICD-10-CM | POA: Diagnosis not present

## 2015-02-21 DIAGNOSIS — M25671 Stiffness of right ankle, not elsewhere classified: Secondary | ICD-10-CM | POA: Diagnosis not present

## 2015-02-21 DIAGNOSIS — M25474 Effusion, right foot: Secondary | ICD-10-CM | POA: Diagnosis not present

## 2015-02-21 DIAGNOSIS — M6281 Muscle weakness (generalized): Secondary | ICD-10-CM | POA: Diagnosis not present

## 2015-02-26 DIAGNOSIS — M25471 Effusion, right ankle: Secondary | ICD-10-CM | POA: Diagnosis not present

## 2015-02-26 DIAGNOSIS — M79604 Pain in right leg: Secondary | ICD-10-CM | POA: Diagnosis not present

## 2015-02-26 DIAGNOSIS — M25571 Pain in right ankle and joints of right foot: Secondary | ICD-10-CM | POA: Diagnosis not present

## 2015-02-26 DIAGNOSIS — M7661 Achilles tendinitis, right leg: Secondary | ICD-10-CM | POA: Diagnosis not present

## 2015-02-26 DIAGNOSIS — M6281 Muscle weakness (generalized): Secondary | ICD-10-CM | POA: Diagnosis not present

## 2015-02-26 DIAGNOSIS — M25474 Effusion, right foot: Secondary | ICD-10-CM | POA: Diagnosis not present

## 2015-02-27 DIAGNOSIS — C9210 Chronic myeloid leukemia, BCR/ABL-positive, not having achieved remission: Secondary | ICD-10-CM | POA: Diagnosis not present

## 2015-03-04 ENCOUNTER — Ambulatory Visit (INDEPENDENT_AMBULATORY_CARE_PROVIDER_SITE_OTHER): Payer: Medicare Other

## 2015-03-04 DIAGNOSIS — Z5181 Encounter for therapeutic drug level monitoring: Secondary | ICD-10-CM | POA: Diagnosis not present

## 2015-03-04 DIAGNOSIS — M79604 Pain in right leg: Secondary | ICD-10-CM | POA: Diagnosis not present

## 2015-03-04 DIAGNOSIS — I2699 Other pulmonary embolism without acute cor pulmonale: Secondary | ICD-10-CM | POA: Diagnosis not present

## 2015-03-04 DIAGNOSIS — M6281 Muscle weakness (generalized): Secondary | ICD-10-CM | POA: Diagnosis not present

## 2015-03-04 DIAGNOSIS — S60012D Contusion of left thumb without damage to nail, subsequent encounter: Secondary | ICD-10-CM | POA: Diagnosis not present

## 2015-03-04 DIAGNOSIS — M7661 Achilles tendinitis, right leg: Secondary | ICD-10-CM | POA: Diagnosis not present

## 2015-03-04 DIAGNOSIS — Z7901 Long term (current) use of anticoagulants: Secondary | ICD-10-CM

## 2015-03-04 DIAGNOSIS — M25571 Pain in right ankle and joints of right foot: Secondary | ICD-10-CM | POA: Diagnosis not present

## 2015-03-04 DIAGNOSIS — M25474 Effusion, right foot: Secondary | ICD-10-CM | POA: Diagnosis not present

## 2015-03-04 DIAGNOSIS — M25471 Effusion, right ankle: Secondary | ICD-10-CM | POA: Diagnosis not present

## 2015-03-04 LAB — POCT INR: INR: 2.5

## 2015-03-05 DIAGNOSIS — R220 Localized swelling, mass and lump, head: Secondary | ICD-10-CM | POA: Diagnosis not present

## 2015-03-07 ENCOUNTER — Inpatient Hospital Stay (HOSPITAL_COMMUNITY)
Admission: EM | Admit: 2015-03-07 | Discharge: 2015-03-08 | DRG: 313 | Disposition: A | Discharge status: Home / Self Care | Payer: Medicare Other | Attending: Internal Medicine | Admitting: Internal Medicine

## 2015-03-07 ENCOUNTER — Emergency Department (HOSPITAL_COMMUNITY): Payer: Medicare Other

## 2015-03-07 ENCOUNTER — Encounter (HOSPITAL_COMMUNITY): Payer: Self-pay | Admitting: Emergency Medicine

## 2015-03-07 ENCOUNTER — Ambulatory Visit: Payer: Medicare Other | Admitting: Gastroenterology

## 2015-03-07 DIAGNOSIS — Z974 Presence of external hearing-aid: Secondary | ICD-10-CM

## 2015-03-07 DIAGNOSIS — E860 Dehydration: Secondary | ICD-10-CM | POA: Diagnosis present

## 2015-03-07 DIAGNOSIS — M542 Cervicalgia: Secondary | ICD-10-CM | POA: Diagnosis present

## 2015-03-07 DIAGNOSIS — Z86711 Personal history of pulmonary embolism: Secondary | ICD-10-CM | POA: Diagnosis present

## 2015-03-07 DIAGNOSIS — Z79899 Other long term (current) drug therapy: Secondary | ICD-10-CM

## 2015-03-07 DIAGNOSIS — E782 Mixed hyperlipidemia: Secondary | ICD-10-CM | POA: Diagnosis not present

## 2015-03-07 DIAGNOSIS — I2510 Atherosclerotic heart disease of native coronary artery without angina pectoris: Secondary | ICD-10-CM | POA: Diagnosis not present

## 2015-03-07 DIAGNOSIS — Z8249 Family history of ischemic heart disease and other diseases of the circulatory system: Secondary | ICD-10-CM

## 2015-03-07 DIAGNOSIS — C9210 Chronic myeloid leukemia, BCR/ABL-positive, not having achieved remission: Secondary | ICD-10-CM | POA: Diagnosis present

## 2015-03-07 DIAGNOSIS — M810 Age-related osteoporosis without current pathological fracture: Secondary | ICD-10-CM | POA: Diagnosis present

## 2015-03-07 DIAGNOSIS — N183 Chronic kidney disease, stage 3 (moderate): Secondary | ICD-10-CM | POA: Diagnosis not present

## 2015-03-07 DIAGNOSIS — K219 Gastro-esophageal reflux disease without esophagitis: Secondary | ICD-10-CM | POA: Diagnosis not present

## 2015-03-07 DIAGNOSIS — G7089 Other specified myoneural disorders: Secondary | ICD-10-CM | POA: Diagnosis present

## 2015-03-07 DIAGNOSIS — E1143 Type 2 diabetes mellitus with diabetic autonomic (poly)neuropathy: Secondary | ICD-10-CM | POA: Diagnosis not present

## 2015-03-07 DIAGNOSIS — K589 Irritable bowel syndrome without diarrhea: Secondary | ICD-10-CM | POA: Diagnosis not present

## 2015-03-07 DIAGNOSIS — Z9861 Coronary angioplasty status: Secondary | ICD-10-CM

## 2015-03-07 DIAGNOSIS — R079 Chest pain, unspecified: Secondary | ICD-10-CM | POA: Diagnosis not present

## 2015-03-07 DIAGNOSIS — E119 Type 2 diabetes mellitus without complications: Secondary | ICD-10-CM | POA: Insufficient documentation

## 2015-03-07 DIAGNOSIS — I5032 Chronic diastolic (congestive) heart failure: Secondary | ICD-10-CM | POA: Diagnosis present

## 2015-03-07 DIAGNOSIS — H409 Unspecified glaucoma: Secondary | ICD-10-CM | POA: Diagnosis present

## 2015-03-07 DIAGNOSIS — E1159 Type 2 diabetes mellitus with other circulatory complications: Secondary | ICD-10-CM

## 2015-03-07 DIAGNOSIS — Z7952 Long term (current) use of systemic steroids: Secondary | ICD-10-CM

## 2015-03-07 DIAGNOSIS — R0789 Other chest pain: Secondary | ICD-10-CM | POA: Diagnosis not present

## 2015-03-07 DIAGNOSIS — G7000 Myasthenia gravis without (acute) exacerbation: Secondary | ICD-10-CM | POA: Diagnosis present

## 2015-03-07 DIAGNOSIS — I10 Essential (primary) hypertension: Secondary | ICD-10-CM | POA: Diagnosis not present

## 2015-03-07 DIAGNOSIS — Z882 Allergy status to sulfonamides status: Secondary | ICD-10-CM

## 2015-03-07 DIAGNOSIS — Z7984 Long term (current) use of oral hypoglycemic drugs: Secondary | ICD-10-CM

## 2015-03-07 DIAGNOSIS — I130 Hypertensive heart and chronic kidney disease with heart failure and stage 1 through stage 4 chronic kidney disease, or unspecified chronic kidney disease: Secondary | ICD-10-CM | POA: Diagnosis present

## 2015-03-07 DIAGNOSIS — K3184 Gastroparesis: Secondary | ICD-10-CM | POA: Diagnosis not present

## 2015-03-07 DIAGNOSIS — Z7901 Long term (current) use of anticoagulants: Secondary | ICD-10-CM

## 2015-03-07 DIAGNOSIS — R0989 Other specified symptoms and signs involving the circulatory and respiratory systems: Secondary | ICD-10-CM | POA: Diagnosis not present

## 2015-03-07 DIAGNOSIS — C9211 Chronic myeloid leukemia, BCR/ABL-positive, in remission: Secondary | ICD-10-CM | POA: Diagnosis present

## 2015-03-07 DIAGNOSIS — K222 Esophageal obstruction: Secondary | ICD-10-CM | POA: Diagnosis not present

## 2015-03-07 DIAGNOSIS — M479 Spondylosis, unspecified: Secondary | ICD-10-CM | POA: Diagnosis present

## 2015-03-07 DIAGNOSIS — Z86718 Personal history of other venous thrombosis and embolism: Secondary | ICD-10-CM

## 2015-03-07 DIAGNOSIS — E1122 Type 2 diabetes mellitus with diabetic chronic kidney disease: Secondary | ICD-10-CM | POA: Diagnosis present

## 2015-03-07 DIAGNOSIS — H9193 Unspecified hearing loss, bilateral: Secondary | ICD-10-CM | POA: Diagnosis present

## 2015-03-07 HISTORY — DX: Esophageal obstruction: K22.2

## 2015-03-07 HISTORY — DX: Disorder of arteries and arterioles, unspecified: I77.9

## 2015-03-07 HISTORY — DX: Peripheral vascular disease, unspecified: I73.9

## 2015-03-07 HISTORY — DX: Essential (primary) hypertension: I10

## 2015-03-07 LAB — BASIC METABOLIC PANEL
Anion gap: 10 (ref 5–15)
BUN: 11 mg/dL (ref 6–20)
CHLORIDE: 104 mmol/L (ref 101–111)
CO2: 27 mmol/L (ref 22–32)
CREATININE: 1.55 mg/dL — AB (ref 0.61–1.24)
Calcium: 9.1 mg/dL (ref 8.9–10.3)
GFR calc Af Amer: 48 mL/min — ABNORMAL LOW (ref >60)
GFR, EST NON AFRICAN AMERICAN: 42 mL/min — AB (ref >60)
GLUCOSE: 101 mg/dL — AB (ref 65–99)
Potassium: 4 mmol/L (ref 3.5–5.1)
SODIUM: 141 mmol/L (ref 135–145)

## 2015-03-07 LAB — I-STAT TROPONIN, ED: Troponin i, poc: 0.01 ng/mL (ref 0.00–0.08)

## 2015-03-07 LAB — PROTIME-INR
INR: 2.21 — ABNORMAL HIGH (ref 0.00–1.49)
PROTHROMBIN TIME: 24.3 s — AB (ref 11.6–15.2)

## 2015-03-07 LAB — CBC
HEMATOCRIT: 41.8 % (ref 39.0–52.0)
Hemoglobin: 13.9 g/dL (ref 13.0–17.0)
MCH: 30.6 pg (ref 26.0–34.0)
MCHC: 33.3 g/dL (ref 30.0–36.0)
MCV: 92.1 fL (ref 78.0–100.0)
PLATELETS: 191 10*3/uL (ref 150–400)
RBC: 4.54 MIL/uL (ref 4.22–5.81)
RDW: 13.2 % (ref 11.5–15.5)
WBC: 10.6 10*3/uL — AB (ref 4.0–10.5)

## 2015-03-07 LAB — GLUCOSE, CAPILLARY: Glucose-Capillary: 142 mg/dL — ABNORMAL HIGH (ref 65–99)

## 2015-03-07 LAB — TSH: TSH: 1.804 u[IU]/mL (ref 0.350–4.500)

## 2015-03-07 LAB — TROPONIN I: Troponin I: <0.03 ng/mL (ref <0.031)

## 2015-03-07 LAB — CBG MONITORING, ED
GLUCOSE-CAPILLARY: 99 mg/dL (ref 65–99)
GLUCOSE-CAPILLARY: 146 mg/dL — AB (ref 65–99)

## 2015-03-07 MED ORDER — ADULT MULTIVITAMIN W/MINERALS CH
1.0000 | ORAL_TABLET | Freq: Every day | ORAL | Status: DC
  Administered 2015-03-08: 1 via ORAL
  Filled 2015-03-07: qty 1

## 2015-03-07 MED ORDER — MORPHINE SULFATE (PF) 4 MG/ML IV SOLN
4.0000 mg | Freq: Once | INTRAVENOUS | Status: AC
  Administered 2015-03-07: 4 mg via INTRAVENOUS
  Filled 2015-03-07: qty 1

## 2015-03-07 MED ORDER — BRIMONIDINE TARTRATE-TIMOLOL 0.2-0.5 % OP SOLN: 1.0000 [drp] | Freq: Two times a day (BID) | OPHTHALMIC | Status: DC

## 2015-03-07 MED ORDER — SODIUM CHLORIDE 0.9 % IV SOLN: INTRAVENOUS | Status: DC

## 2015-03-07 MED ORDER — ONDANSETRON HCL 4 MG/2ML IJ SOLN: 4.0000 mg | Freq: Four times a day (QID) | INTRAMUSCULAR | Status: DC | PRN

## 2015-03-07 MED ORDER — TIMOLOL MALEATE 0.5 % OP SOLN
1.0000 [drp] | Freq: Two times a day (BID) | OPHTHALMIC | Status: DC
  Administered 2015-03-07 – 2015-03-08 (×2): 1 [drp] via OPHTHALMIC
  Filled 2015-03-07: qty 5

## 2015-03-07 MED ORDER — ROSUVASTATIN CALCIUM 10 MG PO TABS
5.0000 mg | ORAL_TABLET | Freq: Every day | ORAL | Status: DC
  Administered 2015-03-07 – 2015-03-08 (×2): 5 mg via ORAL
  Filled 2015-03-07 (×2): qty 1

## 2015-03-07 MED ORDER — MORPHINE SULFATE (PF) 2 MG/ML IV SOLN: 2.0000 mg | INTRAVENOUS | Status: DC | PRN

## 2015-03-07 MED ORDER — ONE-DAILY MULTI VITAMINS PO TABS: 1.0000 | ORAL_TABLET | Freq: Every day | ORAL | Status: DC

## 2015-03-07 MED ORDER — FUROSEMIDE 10 MG/ML IJ SOLN
40.0000 mg | Freq: Once | INTRAMUSCULAR | Status: AC
  Administered 2015-03-07: 40 mg via INTRAVENOUS
  Filled 2015-03-07: qty 4

## 2015-03-07 MED ORDER — PREDNISONE 5 MG PO TABS
5.0000 mg | ORAL_TABLET | Freq: Every day | ORAL | Status: DC
  Administered 2015-03-07 – 2015-03-08 (×2): 5 mg via ORAL
  Filled 2015-03-07 (×2): qty 1

## 2015-03-07 MED ORDER — ISOSORBIDE MONONITRATE ER 30 MG PO TB24
30.0000 mg | ORAL_TABLET | Freq: Every day | ORAL | Status: DC
  Administered 2015-03-07 – 2015-03-08 (×2): 30 mg via ORAL
  Filled 2015-03-07 (×2): qty 1

## 2015-03-07 MED ORDER — METOPROLOL SUCCINATE ER 25 MG PO TB24
25.0000 mg | ORAL_TABLET | Freq: Every day | ORAL | Status: DC
  Administered 2015-03-07 – 2015-03-08 (×2): 25 mg via ORAL
  Filled 2015-03-07 (×2): qty 1

## 2015-03-07 MED ORDER — INSULIN ASPART 100 UNIT/ML ~~LOC~~ SOLN
0.0000 [IU] | Freq: Three times a day (TID) | SUBCUTANEOUS | Status: DC
  Administered 2015-03-07: 2 [IU] via SUBCUTANEOUS
  Filled 2015-03-07: qty 1

## 2015-03-07 MED ORDER — INSULIN ASPART 100 UNIT/ML ~~LOC~~ SOLN: 0.0000 [IU] | Freq: Every day | SUBCUTANEOUS | Status: DC

## 2015-03-07 MED ORDER — GI COCKTAIL ~~LOC~~: 30.0000 mL | Freq: Four times a day (QID) | ORAL | Status: DC | PRN

## 2015-03-07 MED ORDER — PANTOPRAZOLE SODIUM 40 MG PO TBEC
40.0000 mg | DELAYED_RELEASE_TABLET | Freq: Every day | ORAL | Status: DC
  Administered 2015-03-08: 40 mg via ORAL
  Filled 2015-03-07: qty 1

## 2015-03-07 MED ORDER — WARFARIN - PHARMACIST DOSING INPATIENT: Freq: Every day | Status: DC

## 2015-03-07 MED ORDER — ACETAMINOPHEN 325 MG PO TABS: 650.0000 mg | ORAL_TABLET | ORAL | Status: DC | PRN

## 2015-03-07 MED ORDER — BRIMONIDINE TARTRATE 0.2 % OP SOLN
1.0000 [drp] | Freq: Two times a day (BID) | OPHTHALMIC | Status: DC
  Administered 2015-03-07 – 2015-03-08 (×2): 1 [drp] via OPHTHALMIC
  Filled 2015-03-07: qty 5

## 2015-03-07 MED ORDER — LORAZEPAM 0.5 MG PO TABS: 0.5000 mg | ORAL_TABLET | Freq: Two times a day (BID) | ORAL | Status: DC | PRN

## 2015-03-07 MED ORDER — ASPIRIN 81 MG PO CHEW
324.0000 mg | CHEWABLE_TABLET | Freq: Once | ORAL | Status: AC
  Administered 2015-03-07: 324 mg via ORAL
  Filled 2015-03-07: qty 4

## 2015-03-07 MED ORDER — IMATINIB MESYLATE 400 MG PO TABS: 400.0000 mg | ORAL_TABLET | Freq: Every day | ORAL | Status: DC

## 2015-03-07 MED ORDER — PYRIDOSTIGMINE BROMIDE 60 MG PO TABS
60.0000 mg | ORAL_TABLET | Freq: Three times a day (TID) | ORAL | Status: DC
  Administered 2015-03-07 – 2015-03-08 (×3): 60 mg via ORAL
  Filled 2015-03-07 (×6): qty 1

## 2015-03-07 MED ORDER — WARFARIN SODIUM 2.5 MG PO TABS: 2.5000 mg | ORAL_TABLET | Freq: Once | ORAL | Status: DC

## 2015-03-07 MED ORDER — METOCLOPRAMIDE HCL 5 MG/5ML PO SOLN: 10.0000 mg | Freq: Three times a day (TID) | ORAL | Status: DC | PRN

## 2015-03-07 MED ORDER — NITROGLYCERIN 0.4 MG SL SUBL
0.4000 mg | SUBLINGUAL_TABLET | SUBLINGUAL | Status: DC | PRN
Start: 2015-03-07 — End: 2015-03-08
  Administered 2015-03-07 (×2): 0.4 mg via SUBLINGUAL
  Filled 2015-03-07: qty 1

## 2015-03-07 NOTE — Consult Note (Signed)
Cardiology Consultation Note    Patient ID: [name], MRN: [id number], DOB/AGE: 1940/09/29 75 y.o. Admit date: 03/07/2015   Date of Consult: 03/07/2015 Primary Physician: Tristan Shin, MD Primary Cardiologist: Dr. Burt [name]  Chief Complaint: left neck/shoulder/arm pain Reason for Consultation: possible chest pain Requesting MD: Dr. Regenia [name]  HPI: [name] is a 75 y/o M with history of CML, CAD (nonobstructive & branch vessel disease managed medically in 2003), diverticulosis, GERD, gastroparesis, esophageal strictures, h/o DVT and bilateral PE in 07/2011 on chronic Coumadin, diabetes mellitus, myasthenia gravis, HLD, chronic edema, internal hemorrhoids, carotid artery disease who presented to [facility] today with left arm/shoulder/neck pain, possible chest pain and elevated BP. His last cath in 2003 showed 10-20% LM, scattered 20% prox-mid LAD, 30% more distal LAD, 50-60% stenosis of LAD towards apex, 20% Cx, 30% dRCA, focal 95% stenosis in smaller of 2 branches of PDA, EF 60-65%. Nuclear stress test 05/2013: Low risk with ECG abnormalities that may represent a "false positive ECG stress test", EF 69% with normal WM. 2D Echo 04/2014: mild LVH, EF 65-70%, grade 1 DD, no RWMA. He had a RLE duplex in 01/2015 which was negative for DVT. Carotid duplex 05/2014 showed 123456 RICA, 123456 LICA, elevated velocities in right subclavian artery, normal left subclavian artery.  He has felt generally more fatigued lately. Last night he went to bed around 10:30pm. He woke up at 11:30pm with significant left shoulder/neck/arm pain. When asked if he had chest pain he says he's not quite sure because everything in that general upper left area just hurt so bad. He began to check his BP periodically and initial reading was 192/102. It remained high over the next several checks. He took 1 SL NTG without significant change in symptoms. Due to persistent symptoms he came to the ER for evaluation. He denied any SOB, vomiting,  palpitations, dizziness, or syncope. He had some nausea here in the ER but this eased off. He received 2 SL NTG around 5:30am here with further improvement in his discomfort. He had full resolution of pain around 9:30am when he was given morphine. Total time of constant pain was sometime between 6-10 hours. Not worse with inspiration or palpation. No recent injuries. He does report that symptoms were made worse by turning his head to the left or down. He has not had symptoms like this recently. He has chronic mild LEE which is not different than usual. He remains pain free at this time.  Labs notable for Cr 1.55 (Prev ~1.3), troponin neg x 1, WBC 10.6. CXR with mild left basilar atx, mild cardiomegaly. On arrival he was hypertensive with peak BP 176/84. He is not tachycardic, tachypneic or hypoxic. INRs were subtherapeutic in 12/2014 but since that time have remained above 2.  Past Medical History  Diagnosis Date  . Coronary atherosclerosis of unspecified type of vessel, native or graft     a. cath in 2003 showed 10-20% LM, scattered 20% prox-mid LAD, 30% more distal LAD, 50-60% stenosis of LAD towards apex, 20% Cx, 30% dRCA, focal 95% stenosis in smaller of 2 branches of PDA, EF 60-65%.  . Mixed hyperlipidemia   . Glucose intolerance (impaired glucose tolerance)     history of  . Diverticulosis of colon (without mention of hemorrhage)   . Hx of colonic polyps   . Internal hemorrhoids without mention of complication   . Rash and other nonspecific skin eruption   . Conjunctivitis unspecified   . Myasthenia gravis ([place])  uses prednisone  . GERD (gastroesophageal reflux disease)   . Pulmonary embolism [facility]) June 2013    a. Bilateral PE 07/2011.  Marland Kitchen DVT (deep venous thrombosis) [facility]) June 2013  . Chronic anticoagulation   . Diabetes mellitus ([place])   . DJD (degenerative joint disease)   . Ptosis   . CML (chronic myeloid leukemia) ([place])   . Essential hypertension   . IBS (irritable bowel  syndrome)   . History of pneumonia   . Dystonia   . 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 PONV (postoperative nausea and vomiting)   . External hemorrhoids without mention of complication   . History of kidney stones   . Tubulovillous adenoma polyp of colon     2010  . HOH (hard of hearing)     hearing aids  . Osteoporosis 2016  . Gastroparesis   . Esophageal stricture   . Chronic edema   . Carotid artery disease ([place])     a. a. Duplex 05/2014: 123456 RICA, 123456 LICA, elevated velocities in right subclavian artery, normal left subclavian artery..      Surgical History:  Past Surgical History  Procedure Laterality Date  . Angioplasty  2001  . Back surgery      x2, lumbar and cervical  . Tonsillectomy    . Cataract extraction Bilateral 12/12  . Shoulder surgery Left 05/07/2009  . Cardiac catheterization    . Lumbar laminectomy/decompression microdiscectomy Right 09/14/2012    Procedure: Right Lumbar three-four, four-five, Lumbar five-Sacral one decompressive laminectomy;  Surgeon: Tristan Moore, MD;  Location: [place] [facility];  Service: Neurosurgery;  Laterality: Right;     Home Meds: Prior to Admission medications   Medication Sig Start Date End Date Taking? Authorizing Provider  brimonidine-timolol (COMBIGAN) 0.2-0.5 % ophthalmic solution Place 1 drop into both eyes every 12 (twelve) hours.   Yes Historical Provider, MD  esomeprazole (NEXIUM) 40 MG capsule Take 1 capsule (40 mg total) by mouth 2 (two) times daily before a meal. 01/29/15  Yes Tristan P Hvozdovic, PA-C  glucose blood (ACCU-CHEK AVIVA) test strip Use to check blood sugar 1 time per day 10/12/14  Yes Tristan Shin, MD  imatinib (GLEEVEC) 400 MG tablet Take 400 mg by mouth daily.    Yes Historical Provider, MD  isosorbide mononitrate (IMDUR) 30 MG 24 hr tablet TAKE 1 TABLET EVERY DAY 09/04/14  Yes Tristan Mocha, MD  loperamide (IMODIUM) 2 MG capsule Take 2 mg by mouth every morning. For diarrhea   Yes Historical  Provider, MD  LORazepam (ATIVAN) 0.5 MG tablet Take 0.5 mg by mouth 3 times/day as needed-between meals & bedtime for sleep.   Yes Historical Provider, MD  metFORMIN (GLUCOPHAGE-XR) 500 MG 24 hr tablet TAKE 1 TABLET EVERY DAY IN THE MORNING 01/02/14  Yes Tristan Shin, MD  metoprolol succinate (TOPROL-XL) 25 MG 24 hr tablet TAKE 1 TABLET EVERY DAY 09/04/14  Yes Tristan Mocha, MD  Multiple Vitamin (MULTIVITAMIN) tablet Take 1 tablet by mouth daily.   Yes Historical Provider, MD  nitroGLYCERIN (NITROSTAT) 0.4 MG SL tablet Place 1 tablet (0.4 mg total) under the tongue every 5 (five) minutes x 3 doses as needed for chest pain. 05/22/14  Yes Jerline Pain, MD  nystatin (MYCOSTATIN) 100000 UNIT/ML suspension Take 5 mLs (500,000 Units total) by mouth 4 (four) times daily. Swish and swallow 5cc four times a day for 10 days. 01/29/15  Yes Tristan P Hvozdovic, PA-C  potassium chloride (K-DUR,KLOR-CON) 10 MEQ tablet Take 1  tablet (10 mEq total) by mouth daily. Patient taking differently: Take 10 mEq by mouth every Monday, Wednesday, and Friday.  07/03/14  Yes Tristan Mocha, MD  predniSONE (DELTASONE) 5 MG tablet Take 1 tablet (5 mg total) by mouth daily. 11/08/14  Yes Kathrynn Ducking, MD  pyridostigmine (MESTINON) 60 MG tablet Take 1 tablet (60 mg total) by mouth 3 (three) times daily. 11/08/14  Yes Kathrynn Ducking, MD  rosuvastatin (CRESTOR) 10 MG tablet Take 0.5 tablets (5 mg total) by mouth daily. 12/13/14  Yes Tristan Mocha, MD  warfarin (COUMADIN) 5 MG tablet Take 2.5-5 mg by mouth See admin instructions. 5 mg on Wednesday, Saturday and all other days are 2.5 mg   Yes Historical Provider, MD  metoCLOPramide (REGLAN) 5 MG/5ML solution Take 5 mLs (5 mg total) by mouth 2 (two) times daily. Take 2 times a day before breakfast and at bedtime. 01/29/15   Tristan P Hvozdovic, PA-C  NEXIUM 40 MG capsule TAKE 1 CAPSULE EVERY MORNING 01/31/14   Inda Castle, MD  predniSONE (DELTASONE) 1 MG tablet Begin 4 tablets a day,  taper by one tablet every 6 weeks until off 11/08/14   Kathrynn Ducking, MD  predniSONE (DELTASONE) 5 MG tablet Take 1 tablet (5 mg total) by mouth daily. 02/08/15   Kathrynn Ducking, MD  warfarin (COUMADIN) 5 MG tablet TAKE 1 TABLET EVERY DAY AS DIRECTED 09/14/14   Tristan Mocha, MD    Inpatient Medications:       Allergies:  Allergies  Allergen Reactions  . Oxycodone-Acetaminophen Other (See Comments)    "too strong"  . Sulfamethoxazole Rash and Itching    Social History   Social History  . Marital Status: Married    Spouse Name: Hassan Rowan  . Number of Children: 2  . Years of Education: 1-College   Occupational History  . retired     Retired   Social History Main Topics  . Smoking status: Never Smoker   . Smokeless tobacco: Never Used  . Alcohol Use: No  . Drug Use: No  . Sexual Activity: Not Currently   Other Topics Concern  . Not on file   Social History Narrative   Patient is right handed.   Patient has 2-3 cups of caffeine weekly.     Family History  Problem Relation Age of Onset  . Heart disease Mother   . Heart attack Mother   . Stroke Mother   . Heart disease Father   . Heart attack Father   . Stroke Father   . Breast cancer Sister     Twin   . Heart attack Sister   . Dementia Sister   . Heart disease Sister   . Heart attack Sister   . Clotting disorder Sister   . Heart attack Sister   . Colon cancer Neg Hx      Review of Systems: All other systems reviewed and are otherwise negative except as noted above.  Labs:  Lab Results  Component Value Date   WBC 10.6* 03/07/2015   HGB 13.9 03/07/2015   HCT 41.8 03/07/2015   MCV 92.1 03/07/2015   PLT 191 03/07/2015    Recent Labs Lab 03/07/15 0513  NA 141  K 4.0  CL 104  CO2 27  BUN 11  CREATININE 1.55*  CALCIUM 9.1  GLUCOSE 101*   Lab Results  Component Value Date   CHOL 133 07/19/2014   HDL 39.30 07/19/2014   LDLCALC 54 07/19/2014  TRIG 199.0* 07/19/2014   Lab Results    Component Value Date   DDIMER 0.28 04/09/2014    Radiology/Studies:  Dg Chest 2 View  03/07/2015  CLINICAL DATA:  Acute onset of left-sided chest pain, radiating up the left neck and left shoulder. Initial encounter. EXAM: CHEST  2 VIEW COMPARISON:  Chest radiograph from 01/17/2015 FINDINGS: The lungs are well-aerated. Mild left basilar atelectasis is noted. There is no evidence of focal opacification, pleural effusion or pneumothorax. The heart is mildly enlarged. No acute osseous abnormalities are seen. IMPRESSION: Mild left basilar atelectasis noted.  Mild cardiomegaly seen. Electronically Signed   By: Garald Balding M.D.   On: 03/07/2015 05:59    Wt Readings from Last 3 Encounters:  03/07/15 180 lb (81.647 kg)  01/29/15 186 lb 12.8 oz (84.732 kg)  01/17/15 185 lb (83.915 kg)   EKG: NSR 62bpm mild ST sagging I, II, V5-V6 - appears similar to 2016  Physical Exam: Blood pressure 176/84, pulse 52, temperature 98.1 F (36.7 C), temperature source Oral, resp. rate 15, height 5\' 7"  (1.702 m), weight 180 lb (81.647 kg), SpO2 94 %. Body mass index is 28.19 kg/(m^2). General: Well developed, well nourished WM, in no acute distress. Head: Normocephalic, atraumatic, sclera non-icteric, no xanthomas, nares are without discharge.  Neck: JVD not elevated. Lungs: Clear bilaterally to auscultation without wheezes, rales, or rhonchi. Breathing is unlabored. Heart: RRR with S1 S2. No murmurs, rubs, or gallops appreciated. Abdomen: Soft, non-tender, non-distended with normoactive bowel sounds. No hepatomegaly. No rebound/guarding. No obvious abdominal masses. Msk:  Strength and tone appear normal for age. Extremities: No clubbing or cyanosis. No edema.  Distal pedal pulses are 2+ and equal bilaterally.  Neuro: Alert and oriented X 3. No facial asymmetry. No focal deficit other than hard of hearing. Moves all extremities spontaneously. Psych:  Responds to questions appropriately with a normal affect.     Assessment and Plan   1. Left neck/shoulder/arm pain with possible component of chest pain - atypical with possible radicular-sounding component. ?MSK versus related to elevated BP. Despite persistent symptoms for 6-10 hours, initial troponin is negative. EKG not significantly changed. He does have known history of CAD previously managed medically in 2003. Will review plans for further workup with MD.  2. Essential HTN - ? driven by pain. If BP remains elevated, consider addition of amlodipine.  3. 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  4. H/o PE/DVT, on Chronic Coumadin - INR therapeutic. Not tachycardic, tachypneic or hypoxic. Had eval for DVT in 01/2015 for chronic edema which was negative.   5. CML - per IM.  Signed, Charlie Pitter PA-C 03/07/2015, 11:53 AM Pager: [phone]  Personally seen and examined. Agree with above. Old cervical neck surgery scar noted Feeling better Trop -, continue to cycle Recent NUC stress low risk Wants to be around for new grand son If trop neg again, no further cardiac workup.  Consider adding amlodipine for BP.  Candee Furbish, MD

## 2015-03-07 NOTE — ED Notes (Signed)
HH/CM lunch tray ordered for pt.

## 2015-03-07 NOTE — ED Notes (Signed)
CBG- 99 

## 2015-03-07 NOTE — ED Notes (Signed)
Patient reports having chest/shoulder pain on the left side, with sbp 180 at home.

## 2015-03-07 NOTE — ED Notes (Signed)
Called xray to report patient is ready for transport.  

## 2015-03-07 NOTE — Progress Notes (Signed)
ANTICOAGULATION CONSULT NOTE - Initial Consult  Pharmacy Consult for warfarin Indication: DVT  Allergies  Allergen Reactions  . Oxycodone-Acetaminophen Other (See Comments)    "too strong"  . Sulfamethoxazole Rash and Itching    Patient Measurements: Height: 5\' 7"  (170.2 cm) Weight: 180 lb (81.647 kg) IBW/kg (Calculated) : 66.1 Heparin Dosing Weight:   Vital Signs: Temp: 98.1 F (36.7 C) (01/26 0509) Temp Source: Oral (01/26 0509) BP: 162/72 mmHg (01/26 1230) Pulse Rate: 58 (01/26 1230)  Labs:  Recent Labs  03/07/15 0513 03/07/15 1112  HGB 13.9  --   HCT 41.8  --   PLT 191  --   LABPROT  --  24.3*  INR  --  2.21*  CREATININE 1.55*  --     Estimated Creatinine Clearance: 41.5 mL/min (by C-G formula based on Cr of 1.55).   Medical History: Past Medical History  Diagnosis Date  . Coronary atherosclerosis of unspecified type of vessel, native or graft     a. cath in 2003 showed 10-20% LM, scattered 20% prox-mid LAD, 30% more distal LAD, 50-60% stenosis of LAD towards apex, 20% Cx, 30% dRCA, focal 95% stenosis in smaller of 2 branches of PDA, EF 60-65%.  . Mixed hyperlipidemia   . Glucose intolerance (impaired glucose tolerance)     history of  . Diverticulosis of colon (without mention of hemorrhage)   . Hx of colonic polyps   . Internal hemorrhoids without mention of complication   . Rash and other nonspecific skin eruption   . Conjunctivitis unspecified   . Myasthenia gravis ([place])     uses prednisone  . GERD (gastroesophageal reflux disease)   . Pulmonary embolism [facility]) June 2013    a. Bilateral PE 07/2011.  Marland Kitchen DVT (deep venous thrombosis) [facility]) June 2013  . Chronic anticoagulation   . Diabetes mellitus ([place])   . DJD (degenerative joint disease)   . Ptosis   . CML (chronic myeloid leukemia) ([place])   . Essential hypertension   . IBS (irritable bowel syndrome)   . History of pneumonia   . Dystonia   . 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 PONV  (postoperative nausea and vomiting)   . External hemorrhoids without mention of complication   . History of kidney stones   . Tubulovillous adenoma polyp of colon     2010  . HOH (hard of hearing)     hearing aids  . Osteoporosis 2016  . Gastroparesis   . Esophageal stricture   . Chronic edema   . Carotid artery disease ([place])     a. Duplex 05/2014: 123456 RICA, 123456 LICA, elevated velocities in right subclavian artery, normal left subclavian artery..    Medications:   (Not in a hospital admission)  Assessment: 75 yo male with CML and multiple other comorbidities. Pt is on warfarin PTA for hx of DVT. PTA dose: 5 mg Wed/Sat, 2.5 mg all other days. Current INR is therapeutic at 2.2, will continue home regimen.  Goal of Therapy:  INR 2-3 Monitor platelets by anticoagulation protocol: Yes   Plan:  -Warfarin 2.5 mg po x1 -Daily INR    Kacy Hegna, Jake Church 03/07/2015,1:24 PM

## 2015-03-07 NOTE — H&P (Signed)
[facility] History and Physical                                                                                    [name], is a 75 y.o. male  MRN: [id number]   DOB - Oct 15, 1940  Admit Date - 03/07/2015  Outpatient Primary MD for the patient is Renato Shin, MD  Referring MD: Regenia Skeeter / ER  PMH: Past Medical History  Diagnosis Date  . Coronary atherosclerosis of unspecified type of vessel, native or graft     a. cath in 2003 showed 10-20% LM, scattered 20% prox-mid LAD, 30% more distal LAD, 50-60% stenosis of LAD towards apex, 20% Cx, 30% dRCA, focal 95% stenosis in smaller of 2 branches of PDA, EF 60-65%.  . Mixed hyperlipidemia   . Glucose intolerance (impaired glucose tolerance)     history of  . Diverticulosis of colon (without mention of hemorrhage)   . Hx of colonic polyps   . Internal hemorrhoids without mention of complication   . Rash and other nonspecific skin eruption   . Conjunctivitis unspecified   . Myasthenia gravis ([place])     uses prednisone  . GERD (gastroesophageal reflux disease)   . Pulmonary embolism [facility]) June 2013    a. Bilateral PE 07/2011.  Marland Kitchen DVT (deep venous thrombosis) [facility]) June 2013  . Chronic anticoagulation   . Diabetes mellitus ([place])   . DJD (degenerative joint disease)   . Ptosis   . 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 Essential hypertension   . IBS (irritable bowel syndrome)   . History of pneumonia   . Dystonia   . 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 PONV (postoperative nausea and vomiting)   . External hemorrhoids without mention of complication   . History of kidney stones   . Tubulovillous adenoma polyp of colon     2010  . HOH (hard of hearing)     hearing aids  . Osteoporosis 2016  . Gastroparesis   . Esophageal stricture   . Chronic edema   . Carotid artery disease ([place])     a. Duplex 05/2014: 123456 RICA, 123456 LICA, elevated velocities in right subclavian artery, normal left subclavian artery..      PSH: Past Surgical History  Procedure Laterality Date  . Angioplasty  2001  . Back surgery      x2, lumbar and cervical  . Tonsillectomy    . Cataract extraction Bilateral 12/12  . Shoulder surgery Left 05/07/2009  . Cardiac catheterization    . Lumbar laminectomy/decompression microdiscectomy Right 09/14/2012    Procedure: Right Lumbar three-four, four-five, Lumbar five-Sacral one decompressive laminectomy;  Surgeon: [name], MD;  Location: [place] [facility];  Service: Neurosurgery;  Laterality: Right;     CC:  Chief Complaint  Patient presents with  . Chest Pain     HPI: 75 yo male patient with past medical history of coronary artery disease (primarily nonobstructive with branch vessel disease managed medically since 2003), dyslipidemia, GERD, diabetic gastroparesis, hypertension, grade 1 diastolic dysfunction with concentric LV hypertrophy, ocular myasthenia gravis, prior cervical neck surgery, history of DVT and PE on chronic  warfarin who presents to the ER after awakening early this morning with chest pain. Patient reports awakened early this morning with left anterior chest pain that radiated up to the neck and down the left arm. No other associated symptoms. His wife noticed his face was quite red so checked his blood pressure at home and states it was markedly elevated from a typical baseline of 130/70. Chest pain began around 11:30 PM and remained constant to the night. Patient took one nitroglycerin with some decrease in the pain. Patient presented to the ER for treatment and noted that pain resolved after IV morphine. On the way to the hospital patient did have some nausea. No new medications in the past few months other than Reglan which he now takes prn. States prednisone recently decreased from 6 mg to 5 mg daily. Only other new symptom is increasing fatigue and subtle increase in lower extremity edema and some weight gain.  ER Evaluation and treatment: Afebrile, BP 137/76,  pulse 66 respirations 21, room air saturations 95% Portable chest x-ray: Mild left basilar atelectasis with no obvious infiltrate or edema EKG: Sinus rhythm with ventricular rate 62 bpm, QTC 419 ms, low voltage R waves in septal leads, no ischemic changes although questionable subtle ST depression in lead 2 and aVFplus V4 through V6 Laboratory data: Creatinine slightly elevated above baseline now at 1.55, showed troponin normal 0.01, CBC slightly elevated 10,600, INR therapeutic 2.21 Sublingual nitroglycerin 0.4 mg tabs 2 doses Aspirin 324 mg by mouth 1 Morphine 4 mg IV 1  Review of Systems   In addition to the HPI above,  No Fever-chills, myalgias or other constitutional symptoms No Headache, changes with Vision or hearing, new weakness, tingling, numbness in any extremity, No problems swallowing food or Liquids, indigestion/reflux No Cough or Shortness of Breath, palpitations, orthopnea or DOE No Abdominal pain, emesis; no melena or hematochezia, no dark tarry stools No dysuria, hematuria or flank pain No new skin rashes, lesions, masses or bruises, No new joints pains-aches No recent loss No polyuria, polydypsia or polyphagia,  *A full 10 point Review of Systems was done, except as stated above, all other Review of Systems were negative.  Social History Social History  Substance Use Topics  . Smoking status: Never Smoker   . Smokeless tobacco: Never Used  . Alcohol Use: No    Resides at: Private residence  Lives with: Spouse  Ambulatory status: Without assistive devices   Family History Family History  Problem Relation Age of Onset  . Heart disease Mother   . Heart attack Mother   . Stroke Mother   . Heart disease Father   . Heart attack Father   . Stroke Father   . Breast cancer Sister     Twin   . Heart attack Sister   . Dementia Sister   . Heart disease Sister   . Heart attack Sister   . Clotting disorder Sister   . Heart attack Sister   . Colon cancer  Neg Hx      Prior to Admission medications   Medication Sig Start Date End Date Taking? Authorizing Provider  brimonidine-timolol (COMBIGAN) 0.2-0.5 % ophthalmic solution Place 1 drop into both eyes every 12 (twelve) hours.   Yes Historical Provider, MD  esomeprazole (NEXIUM) 40 MG capsule Take 1 capsule (40 mg total) by mouth 2 (two) times daily before a meal. 01/29/15  Yes Lori P Hvozdovic, PA-C  glucose blood (ACCU-CHEK AVIVA) test strip Use to check blood sugar 1  time per day 10/12/14  Yes Renato Shin, MD  imatinib (GLEEVEC) 400 MG tablet Take 400 mg by mouth daily.    Yes Historical Provider, MD  isosorbide mononitrate (IMDUR) 30 MG 24 hr tablet TAKE 1 TABLET EVERY DAY 09/04/14  Yes Sherren Mocha, MD  loperamide (IMODIUM) 2 MG capsule Take 2 mg by mouth every morning. For diarrhea   Yes Historical Provider, MD  LORazepam (ATIVAN) 0.5 MG tablet Take 0.5 mg by mouth 3 times/day as needed-between meals & bedtime for sleep.   Yes Historical Provider, MD  metFORMIN (GLUCOPHAGE-XR) 500 MG 24 hr tablet TAKE 1 TABLET EVERY DAY IN THE MORNING 01/02/14  Yes Renato Shin, MD  metoprolol succinate (TOPROL-XL) 25 MG 24 hr tablet TAKE 1 TABLET EVERY DAY 09/04/14  Yes Sherren Mocha, MD  Multiple Vitamin (MULTIVITAMIN) tablet Take 1 tablet by mouth daily.   Yes Historical Provider, MD  nitroGLYCERIN (NITROSTAT) 0.4 MG SL tablet Place 1 tablet (0.4 mg total) under the tongue every 5 (five) minutes x 3 doses as needed for chest pain. 05/22/14  Yes Jerline Pain, MD  nystatin (MYCOSTATIN) 100000 UNIT/ML suspension Take 5 mLs (500,000 Units total) by mouth 4 (four) times daily. Swish and swallow 5cc four times a day for 10 days. 01/29/15  Yes Lori P Hvozdovic, PA-C  potassium chloride (K-DUR,KLOR-CON) 10 MEQ tablet Take 1 tablet (10 mEq total) by mouth daily. Patient taking differently: Take 10 mEq by mouth every Monday, Wednesday, and Friday.  07/03/14  Yes Sherren Mocha, MD  predniSONE (DELTASONE) 5 MG tablet  Take 1 tablet (5 mg total) by mouth daily. 11/08/14  Yes Kathrynn Ducking, MD  pyridostigmine (MESTINON) 60 MG tablet Take 1 tablet (60 mg total) by mouth 3 (three) times daily. 11/08/14  Yes Kathrynn Ducking, MD  rosuvastatin (CRESTOR) 10 MG tablet Take 0.5 tablets (5 mg total) by mouth daily. 12/13/14  Yes Sherren Mocha, MD  warfarin (COUMADIN) 5 MG tablet Take 2.5-5 mg by mouth See admin instructions. 5 mg on Wednesday, Saturday and all other days are 2.5 mg   Yes Historical Provider, MD  metoCLOPramide (REGLAN) 5 MG/5ML solution Take 5 mLs (5 mg total) by mouth 2 (two) times daily. Take 2 times a day before breakfast and at bedtime. 01/29/15   Lori P Hvozdovic, PA-C  NEXIUM 40 MG capsule TAKE 1 CAPSULE EVERY MORNING 01/31/14   Inda Castle, MD  predniSONE (DELTASONE) 1 MG tablet Begin 4 tablets a day, taper by one tablet every 6 weeks until off 11/08/14   Kathrynn Ducking, MD  predniSONE (DELTASONE) 5 MG tablet Take 1 tablet (5 mg total) by mouth daily. 02/08/15   Kathrynn Ducking, MD  warfarin (COUMADIN) 5 MG tablet TAKE 1 TABLET EVERY DAY AS DIRECTED 09/14/14   Sherren Mocha, MD    Allergies  Allergen Reactions  . Oxycodone-Acetaminophen Other (See Comments)    "too strong"  . Sulfamethoxazole Rash and Itching    Physical Exam  Vitals  Blood pressure 162/72, pulse 58, temperature 98.1 F (36.7 C), temperature source Oral, resp. rate 21, height 5\' 7"  (1.702 m), weight 180 lb (81.647 kg), SpO2 96 %.   General:  In no acute distress, appears healthy and well nourished  Psych:  Normal affect, Denies Suicidal or Homicidal ideations, Awake Alert, Oriented X 3. Speech and thought patterns are clear and appropriate, no apparent short term memory deficits  Neuro:   No focal neurological deficits, CN II through XII intact, Strength 5/5  all 4 extremities, Sensation intact all 4 extremities.  ENT:  Ears and Eyes appear Normal, Conjunctivae clear, PER. Moist oral mucosa without erythema or  exudates.  Neck:  Supple, No lymphadenopathy appreciated  Respiratory:  Symmetrical chest wall movement, Good air movement bilaterally, worse lung sounds posteriorly without obvious crackles or rhonchi-occasional scattered wheeze. Room Air  Cardiac:  RRR, No Murmurs, trace to 1+ bilateral LE edema noted, no JVD, No carotid bruits, peripheral pulses palpable at 2+  Abdomen:  Positive bowel sounds, Soft, Non tender, Non distended,  No masses appreciated, no obvious hepatosplenomegaly  Skin:  No Cyanosis, Normal Skin Turgor, No Skin Rash or Bruise.  Extremities: Symmetrical without obvious trauma or injury,  no effusions.  Data Review  CBC  Recent Labs Lab 03/07/15 0513  WBC 10.6*  HGB 13.9  HCT 41.8  PLT 191  MCV 92.1  MCH 30.6  MCHC 33.3  RDW 13.2    Chemistries   Recent Labs Lab 03/07/15 0513  NA 141  K 4.0  CL 104  CO2 27  GLUCOSE 101*  BUN 11  CREATININE 1.55*  CALCIUM 9.1    estimated creatinine clearance is 41.5 mL/min (by C-G formula based on Cr of 1.55).  No results for input(s): TSH, T4TOTAL, T3FREE, THYROIDAB in the last 72 hours.  Invalid input(s): FREET3  Coagulation profile  Recent Labs Lab 03/04/15 0936 03/07/15 1112  INR 2.5 2.21*    No results for input(s): DDIMER in the last 72 hours.  Cardiac Enzymes No results for input(s): CKMB, TROPONINI, MYOGLOBIN in the last 168 hours.  Invalid input(s): CK  Invalid input(s): POCBNP  Urinalysis    Component Value Date/Time   COLORURINE YELLOW 04/09/2014 0910   APPEARANCEUR CLEAR 04/09/2014 0910   LABSPEC 1.020 04/09/2014 0910   PHURINE 6.0 04/09/2014 0910   GLUCOSEU NEGATIVE 04/09/2014 0910   GLUCOSEU NEG 04/24/2013 1034   HGBUR NEGATIVE 04/09/2014 0910   BILIRUBINUR NEGATIVE 04/09/2014 0910   KETONESUR NEGATIVE 04/09/2014 0910   PROTEINUR NEG 04/24/2013 1034   UROBILINOGEN 1.0 04/09/2014 0910   NITRITE NEGATIVE 04/09/2014 0910   LEUKOCYTESUR NEGATIVE 04/09/2014 0910     Imaging results:   Dg Chest 2 View  03/07/2015  CLINICAL DATA:  Acute onset of left-sided chest pain, radiating up the left neck and left shoulder. Initial encounter. EXAM: CHEST  2 VIEW COMPARISON:  Chest radiograph from 01/17/2015 FINDINGS: The lungs are well-aerated. Mild left basilar atelectasis is noted. There is no evidence of focal opacification, pleural effusion or pneumothorax. The heart is mildly enlarged. No acute osseous abnormalities are seen. IMPRESSION: Mild left basilar atelectasis noted.  Mild cardiomegaly seen. Electronically Signed   By: Garald Balding M.D.   On: 03/07/2015 05:59     EKG: (Independently reviewed)  Sinus rhythm with ventricular rate 62 bpm, QTC 419 ms, low voltage R waves in septal leads, no ischemic changes although questionable subtle ST depression in lead 2 and aVFplus V4 through V6   Assessment & Plan  Principal Problem:   Chest pain syndrome/known CAD -Atypical symptomatology although pain was partially relieved with sublingual nitroglycerin -Admit to Tele/ Obs -Appreciate cardiology input -Cycle troponin -Last echocardiogram March 2016 and per cardiology no indication to repeat -Heart score 3-4 in primarily because of underlying history of CAD and advanced age -Continue preadmission beta blocker and nitrate -Patient with history of prior cervical neck surgeries but does not appear to have a musculoskeletal component nor is neck/arm discomfort reproducible with passive range of motion and  patient does not have any radicular symptoms such as pain or numbness with movement of head or neck  Active Problems:)   Chronic diastolic heart failure, NYHA class 1 /moderate concentric LVH -Patient boarding increasing fatigue and subtle increase in weight and lower extremity edema -Also with slightly increased creatinine which may be indicative of low perfusion setting of heart failure -Unclear if remains on oral Lasix at home -We'll give one-time dose  Lasix 40 mg IV to determine if increased urinary output and improvement in renal function    CML -Continue Gleevec    Essential hypertension -Currently controlled -cont preadmission medications    Diabetes mellitus type 2, controlled  -Very well controlled on metformin prior to admission -Hgb A1c December 2016 was 5.5 -Given stage III chronic kidney disease consideration can be given to using alternative agent as opposed to metformin; patient does have sulfa allergy and a need to utilize something such as Tradjenta or Januvia -Setting of acute kidney injury will hold metformin for now -SSI    History of pulmonary embolism/DVT  -Pharmacy to manage warfarin    Diabetic gastroparesis  -Continue prn Reglan    HYPERLIPIDEMIA, MIXED -cont Crestor    Ocular myasthenia  -cont Mestinon and prednisone    GERD with stricture -Continue PPI    DVT Prophylaxis: Warfarin  Family Communication:  Wife at bedside   Code Status:  DO NOT RESUSCITATE  Condition:  Stable  Discharge disposition: Anticipate discharge by the home environment 4 hours pending resolution of symptoms  Time spent in minutes : 60      Lilu Mcglown L. ANP on 03/07/2015 at 12:52 PM  You may contact me by going to [email or web address] - password [email or web address]  I am available from 7a-7p but please confirm I am on the schedule by going to Amion as above.   After 7p please contact night coverage person covering me after hours  [facility]

## 2015-03-07 NOTE — ED Notes (Signed)
ekg given to Dr. Christy Gentles.verbal order for nitro prn.

## 2015-03-07 NOTE — ED Provider Notes (Signed)
CSN: [id number]     Arrival date & time    History   First MD Initiated Contact with Patient 03/07/15 [phone]     Chief Complaint  Patient presents with  . Chest Pain     (Consider location/radiation/quality/duration/timing/severity/associated sxs/prior Treatment) HPI [name] is a 75 year old male with a past medical history of CAD, T2DM, HTN who presents with sharp chest pain radiating to his neck and left shoulder. Patient and wife explain pain began at midnight. Wife took serial blood pressures with systolic around [id number]. He took a nitro tab with mild relief and came to hospital around 4am. At hospital he received two more doses of nitro with increasing relief. He has a positive history for angina 1-2 year ago and is followed by Dr. Burt Knack. His last echo in May showed grade 1 diastolic dysfunction and he has not had a LHC in many years. He endorses mild nausea, anxiety and non productive cough. Denies SOB, palpitations, epigastric/abdominal pain, fever, orthopnea and PND.  Past Medical History  Diagnosis Date  . Coronary atherosclerosis of unspecified type of vessel, native or graft     remote cath in 2003 showing distal and branch vessel CAD with normal LV function; managed medically  . Mixed hyperlipidemia   . Glucose intolerance (impaired glucose tolerance)     history of  . Diverticulosis of colon (without mention of hemorrhage)   . Hx of colonic polyps   . Internal hemorrhoids without mention of complication   . Rash and other nonspecific skin eruption   . Pneumonia, organism unspecified   . Conjunctivitis unspecified   . Myasthenia gravis ([place])     uses prednisone  . GERD (gastroesophageal reflux disease)   . Pulmonary embolism [facility]) June 2013  . DVT (deep venous thrombosis) [facility]) June 2013  . Chronic anticoagulation   . Diabetes mellitus ([place])   . DJD (degenerative joint disease)   . Ptosis   . 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 HTN (hypertension)   . IBS  (irritable bowel syndrome)   . History of pneumonia   . Dystonia   . High cholesterol   . Heart disease   . Cancer ([place])   . 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 PONV (postoperative nausea and vomiting)   . External hemorrhoids without mention of complication     blood clot left 6/13 with pe  . History of kidney stones   . Tubulovillous adenoma polyp of colon     2010  . HOH (hard of hearing)     hearing aids  . Osteoporosis 2016   Past Surgical History  Procedure Laterality Date  . Angioplasty  2001  . Back surgery      x2, lumbar and cervical  . Tonsillectomy    . Cataract extraction Bilateral 12/12  . Shoulder surgery Left 05/07/2009  . Cardiac catheterization    . Lumbar laminectomy/decompression microdiscectomy Right 09/14/2012    Procedure: Right Lumbar three-four, four-five, Lumbar five-Sacral one decompressive laminectomy;  Surgeon: [name], MD;  Location: [place] [facility];  Service: Neurosurgery;  Laterality: Right;   Family History  Problem Relation Age of Onset  . Heart disease Mother   . Heart attack Mother   . Stroke Mother   . Heart disease Father   . Heart attack Father   . Stroke Father   . Breast cancer Sister     Twin   . Heart attack Sister   . Dementia Sister   .  Heart disease Sister   . Heart attack Sister   . Clotting disorder Sister   . Heart attack Sister   . Colon cancer Neg Hx    Social History  Substance Use Topics  . Smoking status: Never Smoker   . Smokeless tobacco: Never Used  . Alcohol Use: No    Review of Systems  Constitutional: Positive for fatigue. Negative for fever, chills and diaphoresis.  HENT: Negative for congestion, ear pain, postnasal drip, rhinorrhea and sinus pressure.   Respiratory: Positive for cough and chest tightness. Negative for shortness of breath and wheezing.   Cardiovascular: Positive for chest pain and leg swelling. Negative for palpitations.  Gastrointestinal: Positive for nausea. Negative  for vomiting, abdominal pain, diarrhea, constipation, blood in stool and abdominal distention.  Genitourinary: Negative for dysuria and difficulty urinating.  Musculoskeletal: Positive for neck pain.  Neurological: Negative for dizziness, syncope, weakness and headaches.      Allergies  Oxycodone-acetaminophen and Sulfamethoxazole  Home Medications   Prior to Admission medications   Medication Sig Start Date End Date Taking? Authorizing Provider  brimonidine-timolol (COMBIGAN) 0.2-0.5 % ophthalmic solution Place 1 drop into both eyes every 12 (twelve) hours.    Historical Provider, MD  esomeprazole (NEXIUM) 40 MG capsule Take 1 capsule (40 mg total) by mouth 2 (two) times daily before a meal. 01/29/15   Lori P Hvozdovic, PA-C  glucose blood (ACCU-CHEK AVIVA) test strip Use to check blood sugar 1 time per day 10/12/14   Renato Shin, MD  imatinib (GLEEVEC) 400 MG tablet Take 400 mg by mouth daily.     Historical Provider, MD  isosorbide mononitrate (IMDUR) 30 MG 24 hr tablet TAKE 1 TABLET EVERY DAY 09/04/14   Sherren Mocha, MD  loperamide (IMODIUM) 2 MG capsule Take 2 mg by mouth every morning. For diarrhea    Historical Provider, MD  LORazepam (ATIVAN) 0.5 MG tablet Take 0.5 mg by mouth 3 times/day as needed-between meals & bedtime for sleep.    Historical Provider, MD  metFORMIN (GLUCOPHAGE-XR) 500 MG 24 hr tablet TAKE 1 TABLET EVERY DAY IN THE MORNING 01/02/14   Renato Shin, MD  metoCLOPramide (REGLAN) 5 MG/5ML solution Take 5 mLs (5 mg total) by mouth 2 (two) times daily. Take 2 times a day before breakfast and at bedtime. 01/29/15   Lori P Hvozdovic, PA-C  metoprolol succinate (TOPROL-XL) 25 MG 24 hr tablet TAKE 1 TABLET EVERY DAY 09/04/14   Sherren Mocha, MD  Multiple Vitamin (MULTIVITAMIN) tablet Take 1 tablet by mouth daily.    Historical Provider, MD  NEXIUM 40 MG capsule TAKE 1 CAPSULE EVERY MORNING 01/31/14   Inda Castle, MD  nitroGLYCERIN (NITROSTAT) 0.4 MG SL tablet Place 1  tablet (0.4 mg total) under the tongue every 5 (five) minutes x 3 doses as needed for chest pain. 05/22/14   Jerline Pain, MD  nystatin (MYCOSTATIN) 100000 UNIT/ML suspension Take 5 mLs (500,000 Units total) by mouth 4 (four) times daily. Swish and swallow 5cc four times a day for 10 days. 01/29/15   Lori P Hvozdovic, PA-C  potassium chloride (K-DUR,KLOR-CON) 10 MEQ tablet Take 1 tablet (10 mEq total) by mouth daily. Patient taking differently: Take 10 mEq by mouth. Take one twice a week 07/03/14   Sherren Mocha, MD  predniSONE (DELTASONE) 1 MG tablet Begin 4 tablets a day, taper by one tablet every 6 weeks until off 11/08/14   Kathrynn Ducking, MD  predniSONE (DELTASONE) 5 MG tablet Take 1 tablet (5  mg total) by mouth daily. 11/08/14   Kathrynn Ducking, MD  predniSONE (DELTASONE) 5 MG tablet Take 1 tablet (5 mg total) by mouth daily. 02/08/15   Kathrynn Ducking, MD  pyridostigmine (MESTINON) 60 MG tablet Take 1 tablet (60 mg total) by mouth 3 (three) times daily. 11/08/14   Kathrynn Ducking, MD  rosuvastatin (CRESTOR) 10 MG tablet Take 0.5 tablets (5 mg total) by mouth daily. 12/13/14   Sherren Mocha, MD  warfarin (COUMADIN) 5 MG tablet TAKE 1 TABLET EVERY DAY AS DIRECTED 09/14/14   Sherren Mocha, MD   BP 152/81 mmHg  Pulse 65  Temp(Src) 98.1 F (36.7 C) (Oral)  Resp 20  Ht 5\' 7"  (1.702 m)  Wt 81.647 kg  BMI 28.19 kg/m2  SpO2 94% Physical Exam  Constitutional: He is oriented to person, place, and time. He appears well-developed and well-nourished. No distress.  HENT:  Head: Normocephalic and atraumatic.  Mouth/Throat: Oropharynx is clear and moist.  Eyes: EOM are normal. Pupils are equal, round, and reactive to light.  Neck: Normal range of motion. Neck supple.  Cardiovascular: Normal rate, regular rhythm and normal heart sounds.  Exam reveals no gallop and no friction rub.   No murmur heard. Pulses:      Radial pulses are 2+ on the right side, and 2+ on the left side.       Posterior  tibial pulses are 1+ on the right side, and 1+ on the left side.  1+ non pitting edema in bilateral lower extremities not extending above the knee.  Pulmonary/Chest: Effort normal and breath sounds normal. He has no wheezes.  Musculoskeletal:       Left shoulder: He exhibits no tenderness, no bony tenderness, no swelling and no effusion.  Pain posterior to  Left SCM that increased with flexion and left sided rotation. Not reproduced with palpation. Pain with flexion of left shoulder. Non tender to palpation  Neurological: He is alert and oriented to person, place, and time. He exhibits normal muscle tone. Coordination normal.  Skin: Skin is warm and dry. He is not diaphoretic.  Psychiatric: He has a normal mood and affect. His behavior is normal.  Nursing note and vitals reviewed.   ED Course  Procedures (including critical care time) Labs Review Labs Reviewed  BASIC METABOLIC PANEL - Abnormal; Notable for the following:    Glucose, Bld 101 (*)    Creatinine, Ser 1.55 (*)    GFR calc non Af Amer 42 (*)    GFR calc Af Amer 48 (*)    All other components within normal limits  CBC - Abnormal; Notable for the following:    WBC 10.6 (*)    All other components within normal limits  I-STAT TROPOININ, ED    Imaging Review Dg Chest 2 View  03/07/2015  CLINICAL DATA:  Acute onset of left-sided chest pain, radiating up the left neck and left shoulder. Initial encounter. EXAM: CHEST  2 VIEW COMPARISON:  Chest radiograph from 01/17/2015 FINDINGS: The lungs are well-aerated. Mild left basilar atelectasis is noted. There is no evidence of focal opacification, pleural effusion or pneumothorax. The heart is mildly enlarged. No acute osseous abnormalities are seen. IMPRESSION: Mild left basilar atelectasis noted.  Mild cardiomegaly seen. Electronically Signed   By: Garald Balding M.D.   On: 03/07/2015 05:59   I have personally reviewed and evaluated these images and lab results as part of my medical  decision-making.   EKG Interpretation   Date/Time:  Thursday March 07 2015 05:08:07 EST Ventricular Rate:  62 PR Interval:  189 QRS Duration: 73 QT Interval:  413 QTC Calculation: 419 R Axis:   -17 Text Interpretation:  Sinus rhythm Borderline left axis deviation Low  voltage, precordial leads Minimal ST depression, diffuse leads artifact  noted No significant change since last tracing Confirmed by Christy Gentles  MD,  Elenore Rota ([id number]) on 03/07/2015 5:15:48 AM       Patient be admitted to hospital for further evaluation and care of his chest pain.  I spoke with cardiology and the [facility].  Patient is advised plan and all questions were answered  Dalia Heading, PA-C 03/07/15 [place], MD 03/07/15 [phone]

## 2015-03-08 DIAGNOSIS — G7000 Myasthenia gravis without (acute) exacerbation: Secondary | ICD-10-CM

## 2015-03-08 DIAGNOSIS — K219 Gastro-esophageal reflux disease without esophagitis: Secondary | ICD-10-CM

## 2015-03-08 DIAGNOSIS — K3184 Gastroparesis: Secondary | ICD-10-CM

## 2015-03-08 DIAGNOSIS — E1143 Type 2 diabetes mellitus with diabetic autonomic (poly)neuropathy: Secondary | ICD-10-CM | POA: Diagnosis not present

## 2015-03-08 DIAGNOSIS — C9210 Chronic myeloid leukemia, BCR/ABL-positive, not having achieved remission: Secondary | ICD-10-CM | POA: Diagnosis not present

## 2015-03-08 DIAGNOSIS — K222 Esophageal obstruction: Secondary | ICD-10-CM

## 2015-03-08 DIAGNOSIS — E782 Mixed hyperlipidemia: Secondary | ICD-10-CM

## 2015-03-08 DIAGNOSIS — I5032 Chronic diastolic (congestive) heart failure: Secondary | ICD-10-CM

## 2015-03-08 DIAGNOSIS — E119 Type 2 diabetes mellitus without complications: Secondary | ICD-10-CM | POA: Insufficient documentation

## 2015-03-08 DIAGNOSIS — R079 Chest pain, unspecified: Secondary | ICD-10-CM | POA: Diagnosis not present

## 2015-03-08 DIAGNOSIS — Z86718 Personal history of other venous thrombosis and embolism: Secondary | ICD-10-CM

## 2015-03-08 LAB — GLUCOSE, CAPILLARY
GLUCOSE-CAPILLARY: 83 mg/dL (ref 65–99)
Glucose-Capillary: 96 mg/dL (ref 65–99)

## 2015-03-08 LAB — PROTIME-INR
INR: 2.1 — AB (ref 0.00–1.49)
PROTHROMBIN TIME: 23.4 s — AB (ref 11.6–15.2)

## 2015-03-08 LAB — TROPONIN I

## 2015-03-08 MED ORDER — BACLOFEN 10 MG PO TABS: 10.0000 mg | ORAL_TABLET | Freq: Two times a day (BID) | ORAL | Status: DC | PRN

## 2015-03-08 NOTE — Discharge Summary (Signed)
Physician Discharge Summary  DAMASCUS BLOT [id number] DOB: [birth year] DOA: 03/07/2015  PCP: Renato Shin, MD  Admit date: 03/07/2015 Discharge date: 03/08/2015  Time spent: 35 minutes  Recommendations for Outpatient Follow-up:  Repeat basic metabolic panel to follow electrolytes and renal function Reassess patient believes status and determine needs of diuretic regimen Please take into consideration substitution of metformin giving chronic kidney disease  Discharge Diagnoses:  Principal Problem:   Chest pain syndrome Active Problems:   CML (chronic myelocytic leukemia) ([place])   HYPERLIPIDEMIA, MIXED   Essential hypertension   CAD (coronary artery disease)   Diabetes mellitus type 2, controlled ([place])   History of pulmonary embolism   History of DVT (deep vein thrombosis)   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 No further chest pain or shortness of breath. Patient has been discharged home with instructions to follow with PCP and neurologist as an outpatient.  Diet recommendation: Heart healthy diet and modify carbohydrates.  Filed Weights   03/07/15 1500 03/07/15 1822 03/08/15 0444  Weight: 84.006 kg (185 lb 3.2 oz) 83.643 kg (184 lb 6.4 oz) 83.326 kg (183 lb 11.2 oz)    History of present illness:  75 yo male patient with past medical history of coronary artery disease (primarily nonobstructive with branch vessel disease managed medically since 2003), dyslipidemia, GERD, diabetic gastroparesis, hypertension, grade 1 diastolic dysfunction with concentric LV hypertrophy, ocular myasthenia gravis, prior cervical neck surgery, history of DVT and PE on chronic warfarin who presents to the ER after awakening early this morning with chest pain. Patient reports awakened early this morning with left anterior chest pain that radiated up to the neck and  down the left arm. No other associated symptoms. His wife noticed his face was quite red so checked his blood pressure at home and states it was markedly elevated from a typical baseline of 130/70. Chest pain began around 11:30 PM and remained constant to the night. Patient took one nitroglycerin with some decrease in the pain. Patient presented to the ER for treatment and noted that pain resolved after IV morphine. On the way to the hospital patient did have some nausea. No new medications in the past few months other than Reglan which he now takes prn. States prednisone recently decreased from 6 mg to 5 mg daily. Only other new symptom is increasing fatigue (after tapering prednisone).  Hospital Course:  Chest pain syndrome/known CAD: -Atypical symptomatology although pain was partially relieved with sublingual nitroglycerin -No abnormalities appreciated on EKG or telemetry  -Troponin negative  3 -Last echocardiogram March 2016 and per cardiology no indication to repeat -Heart score 3-4 in primarily because of underlying history of CAD and advanced age -Continue preadmission beta blocker and nitrate -Patient with recent nuclear stress test that demonstrated to be low risk probability. -When necessary baclofen for muscle spasm and neck pain.  Chronic diastolic heart failure, NYHA class 1 /moderate concentric LVH -Patient without shortness of breath, no JVD and clear lungs on auscultation -Advised to follow a low-sodium diet and to check his weight on daily basis -Continue beta blocker -no need for diabetes is currently -Will need close follow-up of his bowling by PCP and cardiology service as an outpatient.   CML -Continue Gleevec -Appears stable   Essential hypertension -Currently well controlled -Continue home antihypertensive medications   Diabetes mellitus type 2, controlled: With gastroparesis -Very  well controlled  -Hgb A1c December 2016 was 5.5 -Given stage III chronic  kidney disease consideration can be given to using alternative agent as opposed to metformin; patient does have sulfa allergy and a need to utilize something such as Tradjenta or Januvia. -Will let PCP to make that decision -Will continue Reglan   History of pulmonary embolism/DVT  -Continue warfarin -INR therapeutic   HYPERLIPIDEMIA, MIXED -Continue Crestor   Ocular myasthenia/glaucoma -cont Mestinon and prednisone -Continue Combigan -Outpatient follow-up with neurology service (Dr. Jannifer Franklin)   GERD with stricture -Continue PPI  Procedures:  See below for x-ray reports  Consultations:  Cardiology  Discharge Exam: Filed Vitals:   03/08/15 0939 03/08/15 1135  BP: 135/64 147/71  Pulse: 59 63  Temp: 98.3 F (36.8 C) 97.7 F (36.5 C)  Resp: 18 18    General: Afebrile, denies any further chest pain or shortness of breath. Still with some neck discomfort/spasm. Cardiovascular: S1 and S2; no rubs, no gallops; no murmurs. No JVD appreciated on exam Respiratory: Clear to auscultation bilaterally, good air movement and no use of accessory muscles. Abdomen: Soft, nontender, nondistended, positive bowel sounds Extremities: No edema of no cyanosis or clubbing  Discharge Instructions   Discharge Instructions    Diet - low sodium heart healthy    Complete by:  As directed      Discharge instructions    Complete by:  As directed   Arrange follow-up with primary care physician in the next 10-14 days Please arrange follow-up with her neurologist (Dr. Jannifer Franklin) Follow a heart healthy diet Keep herself well hydrated Take medications as prescribed          Current Discharge Medication List    START taking these medications   Details  baclofen (LIORESAL) 10 MG tablet Take 1 tablet (10 mg total) by mouth every 12 (twelve) hours as needed for muscle spasms (neck pain). Qty: 30 each, Refills: 0      CONTINUE these medications which have NOT CHANGED   Details   brimonidine-timolol (COMBIGAN) 0.2-0.5 % ophthalmic solution Place 1 drop into both eyes every 12 (twelve) hours.    esomeprazole (NEXIUM) 40 MG capsule Take 1 capsule (40 mg total) by mouth 2 (two) times daily before a meal. Qty: 180 capsule, Refills: 1    glucose blood (ACCU-CHEK AVIVA) test strip Use to check blood sugar 1 time per day Qty: 100 each, Refills: 2    imatinib (GLEEVEC) 400 MG tablet Take 400 mg by mouth daily.     isosorbide mononitrate (IMDUR) 30 MG 24 hr tablet TAKE 1 TABLET EVERY DAY Qty: 90 tablet, Refills: 3    loperamide (IMODIUM) 2 MG capsule Take 2 mg by mouth every morning. For diarrhea    LORazepam (ATIVAN) 0.5 MG tablet Take 0.5 mg by mouth 3 times/day as needed-between meals & bedtime for sleep.    metFORMIN (GLUCOPHAGE-XR) 500 MG 24 hr tablet TAKE 1 TABLET EVERY DAY IN THE MORNING Qty: 90 tablet, Refills: 0    metoprolol succinate (TOPROL-XL) 25 MG 24 hr tablet TAKE 1 TABLET EVERY DAY Qty: 90 tablet, Refills: 3    Multiple Vitamin (MULTIVITAMIN) tablet Take 1 tablet by mouth daily.    nitroGLYCERIN (NITROSTAT) 0.4 MG SL tablet Place 1 tablet (0.4 mg total) under the tongue every 5 (five) minutes x 3 doses as needed for chest pain. Qty: 90 tablet, Refills: 3    nystatin (MYCOSTATIN) 100000 UNIT/ML suspension Take 5 mLs (500,000 Units total) by mouth 4 (four) times daily.  Swish and swallow 5cc four times a day for 10 days. Qty: 200 mL, Refills: 1    potassium chloride (K-DUR,KLOR-CON) 10 MEQ tablet Take 1 tablet (10 mEq total) by mouth daily. Qty: 90 tablet, Refills: 3   Associated Diagnoses: Atherosclerosis of native coronary artery of native heart without angina pectoris; Mixed hyperlipidemia; Essential hypertension    predniSONE (DELTASONE) 5 MG tablet Take 1 tablet (5 mg total) by mouth daily. Qty: 90 tablet, Refills: 1    pyridostigmine (MESTINON) 60 MG tablet Take 1 tablet (60 mg total) by mouth 3 (three) times daily. Qty: 90 tablet, Refills:  5    rosuvastatin (CRESTOR) 10 MG tablet Take 0.5 tablets (5 mg total) by mouth daily. Qty: 45 tablet, Refills: 1    warfarin (COUMADIN) 5 MG tablet Take 2.5-5 mg by mouth See admin instructions. 5 mg on Wednesday, Saturday and all other days are 2.5 mg    metoCLOPramide (REGLAN) 5 MG/5ML solution Take 5 mLs (5 mg total) by mouth 2 (two) times daily. Take 2 times a day before breakfast and at bedtime. Qty: 300 mL, Refills: 2       Allergies  Allergen Reactions  . Oxycodone-Acetaminophen Other (See Comments)    "too strong"  . Sulfamethoxazole Rash and Itching   Follow-up Information    Follow up with Renato Shin, MD. Schedule an appointment as soon as possible for a visit in 14 days.   Specialty:  Endocrinology   Contact information:   [address] [place] [address] [phone]       Call Lenor Coffin, MD.   Specialty:  Neurology   Why:  Contact office to set up follow-up appointment   Contact information:   [address] [place]  [address] [phone]       The results of significant diagnostics from this hospitalization (including imaging, microbiology, ancillary and laboratory) are listed below for reference.    Significant Diagnostic Studies: Dg Chest 2 View  03/07/2015  CLINICAL DATA:  Acute onset of left-sided chest pain, radiating up the left neck and left shoulder. Initial encounter. EXAM: CHEST  2 VIEW COMPARISON:  Chest radiograph from 01/17/2015 FINDINGS: The lungs are well-aerated. Mild left basilar atelectasis is noted. There is no evidence of focal opacification, pleural effusion or pneumothorax. The heart is mildly enlarged. No acute osseous abnormalities are seen. IMPRESSION: Mild left basilar atelectasis noted.  Mild cardiomegaly seen. Electronically Signed   By: Garald Balding M.D.   On: 03/07/2015 05:59   Labs: Basic Metabolic Panel:  Recent Labs Lab 03/07/15 0513  NA 141  K 4.0  CL 104  CO2 27   GLUCOSE 101*  BUN 11  CREATININE 1.55*  CALCIUM 9.1   CBC:  Recent Labs Lab 03/07/15 0513  WBC 10.6*  HGB 13.9  HCT 41.8  MCV 92.1  PLT 191   Cardiac Enzymes:  Recent Labs Lab 03/07/15 1945 03/08/15 0020  TROPONINI <0.03 <0.03   ProBNP (last 3 results)  Recent Labs  04/09/14 0910  PROBNP 49.0    CBG:  Recent Labs Lab 03/07/15 1338 03/07/15 1802 03/07/15 2057 03/08/15 0759 03/08/15 1124  GLUCAP 99 146* 142* 96 83    Signed:  Barton Dubois MD.  [facility] 03/08/2015, 1:11 PM

## 2015-03-08 NOTE — Progress Notes (Signed)
ANTICOAGULATION CONSULT NOTE - Follow Up Consult  Pharmacy Consult for Warfafrin Indication: DVT  Allergies  Allergen Reactions  . Oxycodone-Acetaminophen Other (See Comments)    "too strong"  . Sulfamethoxazole Rash and Itching    Patient Measurements: Height: 5\' 7"  (170.2 cm) Weight: 183 lb 11.2 oz (83.326 kg) IBW/kg (Calculated) : 66.1  Vital Signs: Temp: 97.7 F (36.5 C) (01/27 1135) Temp Source: Oral (01/27 1135) BP: 147/71 mmHg (01/27 1135) Pulse Rate: 63 (01/27 1135)  Labs:  Recent Labs  03/07/15 0513 03/07/15 1112 03/07/15 1945 03/08/15 0020  HGB 13.9  --   --   --   HCT 41.8  --   --   --   PLT 191  --   --   --   LABPROT  --  24.3*  --  23.4*  INR  --  2.21*  --  2.10*  CREATININE 1.55*  --   --   --   TROPONINI  --   --  <0.03 <0.03    Estimated Creatinine Clearance: 41.9 mL/min (by C-G formula based on Cr of 1.55).   Medications:  Scheduled:  . brimonidine  1 drop Both Eyes Q12H  . imatinib  400 mg Oral Daily  . insulin aspart  0-15 Units Subcutaneous TID WC  . insulin aspart  0-5 Units Subcutaneous QHS  . isosorbide mononitrate  30 mg Oral Daily  . metoprolol succinate  25 mg Oral Daily  . multivitamin with minerals  1 tablet Oral Daily  . pantoprazole  40 mg Oral Daily  . predniSONE  5 mg Oral Daily  . pyridostigmine  60 mg Oral TID  . rosuvastatin  5 mg Oral Daily  . timolol  1 drop Both Eyes BID  . warfarin  2.5 mg Oral ONCE-1800  . Warfarin - Pharmacist Dosing Inpatient   Does not apply q1800   Infusions:    Assessment: 75 yo M on Coumadin PTA for hx DVT.  Pt was seen and evaluated by pharmacist yesterday who intended the patient restart home Coumadin dose of 2.5mg  last PM.  However this was mistakenly scheduled to be given 1/27 instead.  Dose missed 1/26 PM.  INR remains therapeutic at this time.  No change.  Goal of Therapy:  INR 2-3 Monitor platelets by anticoagulation protocol: Yes   Plan:  Coumadin 2.5mg  PO x 1  tonight.  [employer], Pharm.D., BCPS Clinical Pharmacist Pager [phone] 03/08/2015 11:49 AM

## 2015-03-09 LAB — HEMOGLOBIN A1C
Hgb A1c MFr Bld: 6 % — ABNORMAL HIGH (ref 4.8–5.6)
Mean Plasma Glucose: 126 mg/dL

## 2015-03-11 DIAGNOSIS — M7661 Achilles tendinitis, right leg: Secondary | ICD-10-CM | POA: Diagnosis not present

## 2015-03-11 DIAGNOSIS — M25571 Pain in right ankle and joints of right foot: Secondary | ICD-10-CM | POA: Diagnosis not present

## 2015-03-13 ENCOUNTER — Ambulatory Visit (INDEPENDENT_AMBULATORY_CARE_PROVIDER_SITE_OTHER): Payer: Medicare Other | Admitting: Endocrinology

## 2015-03-13 ENCOUNTER — Telehealth: Payer: Self-pay | Admitting: Cardiovascular Disease

## 2015-03-13 ENCOUNTER — Other Ambulatory Visit: Payer: Self-pay | Admitting: *Deleted

## 2015-03-13 VITALS — BP 132/82 | HR 69 | Temp 98.1°F | Ht 65.0 in | Wt 187.0 lb

## 2015-03-13 DIAGNOSIS — I5032 Chronic diastolic (congestive) heart failure: Secondary | ICD-10-CM

## 2015-03-13 DIAGNOSIS — N289 Disorder of kidney and ureter, unspecified: Secondary | ICD-10-CM

## 2015-03-13 LAB — BASIC METABOLIC PANEL
BUN: 14 mg/dL (ref 6–23)
CHLORIDE: 101 meq/L (ref 96–112)
CO2: 29 meq/L (ref 19–32)
CREATININE: 1.53 mg/dL — AB (ref 0.40–1.50)
Calcium: 9.4 mg/dL (ref 8.4–10.5)
GFR: 47.16 mL/min — ABNORMAL LOW (ref >60.00)
GLUCOSE: 166 mg/dL — AB (ref 70–99)
Potassium: 4.4 mEq/L (ref 3.5–5.1)
SODIUM: 139 meq/L (ref 135–145)

## 2015-03-13 LAB — BRAIN NATRIURETIC PEPTIDE: Pro B Natriuretic peptide (BNP): 58 pg/mL (ref 0.0–100.0)

## 2015-03-13 MED ORDER — ROSUVASTATIN CALCIUM 5 MG PO TABS: 5.0000 mg | ORAL_TABLET | Freq: Every day | ORAL | Status: DC

## 2015-03-13 NOTE — Telephone Encounter (Signed)
New rx sent in for rosuvastatin 5mg  tablets.

## 2015-03-13 NOTE — Progress Notes (Signed)
Subjective:    Patient ID: [name], male    DOB: 1940/08/04, 75 y.o.   MRN: [id number]  HPI  the state of at least three ongoing medical problems is addressed today, with interval history of each noted here: Pt returns for f/u of diabetes mellitus: DM type: 2 Dx'ed: 0000000 Complications: bilat carotid stenoses and renal insufficiency. Therapy: metformin DKA: never Severe hypoglycemia: never Pancreatitis: never Other: he has never taken insulin; therapy has been limited by intermittent prednisone for MG Interval history: He has gained a few lbs. Renal insuff was noted in the hospital: no weight change. Edema: pt states unchanged Osteoporosis: he declines reclast for now.  No falls Past Medical History  Diagnosis Date  . Coronary atherosclerosis of unspecified type of vessel, native or graft     a. cath in 2003 showed 10-20% LM, scattered 20% prox-mid LAD, 30% more distal LAD, 50-60% stenosis of LAD towards apex, 20% Cx, 30% dRCA, focal 95% stenosis in smaller of 2 branches of PDA, EF 60-65%.  . Mixed hyperlipidemia   . Glucose intolerance (impaired glucose tolerance)     history of  . Diverticulosis of colon (without mention of hemorrhage)   . Hx of colonic polyps   . Internal hemorrhoids without mention of complication   . Rash and other nonspecific skin eruption   . Conjunctivitis unspecified   . Myasthenia gravis ([place])     uses prednisone  . GERD (gastroesophageal reflux disease)   . Pulmonary embolism [facility]) June 2013    a. Bilateral PE 07/2011.  Marland Kitchen DVT (deep venous thrombosis) [facility]) June 2013  . Chronic anticoagulation   . Diabetes mellitus ([place])   . DJD (degenerative joint disease)   . Ptosis   . 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 Essential hypertension   . IBS (irritable bowel syndrome)   . History of pneumonia   . Dystonia   . 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 PONV (postoperative nausea and vomiting)   . External hemorrhoids without mention  of complication   . History of kidney stones   . Tubulovillous adenoma polyp of colon     2010  . HOH (hard of hearing)     hearing aids  . Osteoporosis 2016  . Gastroparesis   . Esophageal stricture   . Chronic edema   . Carotid artery disease ([place])     a. Duplex 05/2014: 123456 RICA, 123456 LICA, elevated velocities in right subclavian artery, normal left subclavian artery..    Past Surgical History  Procedure Laterality Date  . Angioplasty  2001  . Back surgery      x2, lumbar and cervical  . Tonsillectomy    . Cataract extraction Bilateral 12/12  . Shoulder surgery Left 05/07/2009  . Cardiac catheterization    . Lumbar laminectomy/decompression microdiscectomy Right 09/14/2012    Procedure: Right Lumbar three-four, four-five, Lumbar five-Sacral one decompressive laminectomy;  Surgeon: [name], MD;  Location: [place] [facility];  Service: Neurosurgery;  Laterality: Right;    Social History   Social History  . Marital Status: Married    Spouse Name: Hassan Rowan  . Number of Children: 2  . Years of Education: 1-College   Occupational History  . retired     Retired   Social History Main Topics  . Smoking status: Never Smoker   . Smokeless tobacco: Never Used  . Alcohol Use: No  . Drug Use: No  . Sexual Activity: Not Currently   Other  Topics Concern  . Not on file   Social History Narrative   Patient is right handed.   Patient has 2-3 cups of caffeine weekly.    Current Outpatient Prescriptions on File Prior to Visit  Medication Sig Dispense Refill  . baclofen (LIORESAL) 10 MG tablet Take 1 tablet (10 mg total) by mouth every 12 (twelve) hours as needed for muscle spasms (neck pain). 30 each 0  . brimonidine-timolol (COMBIGAN) 0.2-0.5 % ophthalmic solution Place 1 drop into both eyes every 12 (twelve) hours.    Marland Kitchen esomeprazole (NEXIUM) 40 MG capsule Take 1 capsule (40 mg total) by mouth 2 (two) times daily before a meal. 180 capsule 1  . glucose blood (ACCU-CHEK AVIVA)  test strip Use to check blood sugar 1 time per day 100 each 2  . imatinib (GLEEVEC) 400 MG tablet Take 400 mg by mouth daily.     . isosorbide mononitrate (IMDUR) 30 MG 24 hr tablet TAKE 1 TABLET EVERY DAY 90 tablet 3  . loperamide (IMODIUM) 2 MG capsule Take 2 mg by mouth every morning. For diarrhea    . LORazepam (ATIVAN) 0.5 MG tablet Take 0.5 mg by mouth 3 times/day as needed-between meals & bedtime for sleep.    . metoprolol succinate (TOPROL-XL) 25 MG 24 hr tablet TAKE 1 TABLET EVERY DAY 90 tablet 3  . Multiple Vitamin (MULTIVITAMIN) tablet Take 1 tablet by mouth daily.    . nitroGLYCERIN (NITROSTAT) 0.4 MG SL tablet Place 1 tablet (0.4 mg total) under the tongue every 5 (five) minutes x 3 doses as needed for chest pain. 90 tablet 3  . predniSONE (DELTASONE) 5 MG tablet Take 1 tablet (5 mg total) by mouth daily. 90 tablet 1  . pyridostigmine (MESTINON) 60 MG tablet Take 1 tablet (60 mg total) by mouth 3 (three) times daily. 90 tablet 5  . warfarin (COUMADIN) 5 MG tablet Take 2.5-5 mg by mouth See admin instructions. 5 mg on Wednesday, Saturday and all other days are 2.5 mg    . metoCLOPramide (REGLAN) 5 MG/5ML solution Take 5 mLs (5 mg total) by mouth 2 (two) times daily. Take 2 times a day before breakfast and at bedtime. (Patient not taking: Reported on 03/13/2015) 300 mL 2  . nystatin (MYCOSTATIN) 100000 UNIT/ML suspension Take 5 mLs (500,000 Units total) by mouth 4 (four) times daily. Swish and swallow 5cc four times a day for 10 days. (Patient not taking: Reported on 03/13/2015) 200 mL 1  . [DISCONTINUED] zolpidem (AMBIEN) 10 MG tablet Take 10 mg by mouth at bedtime as needed.      No current facility-administered medications on file prior to visit.    Allergies  Allergen Reactions  . Oxycodone-Acetaminophen Other (See Comments)    "too strong"  . Sulfamethoxazole Rash and Itching    Family History  Problem Relation Age of Onset  . Heart disease Mother   . Heart attack Mother   .  Stroke Mother   . Heart disease Father   . Heart attack Father   . Stroke Father   . Breast cancer Sister     Twin   . Heart attack Sister   . Dementia Sister   . Heart disease Sister   . Heart attack Sister   . Clotting disorder Sister   . Heart attack Sister   . Colon cancer Neg Hx     BP 132/82 mmHg  Pulse 69  Temp(Src) 98.1 F (36.7 C) (Oral)  Ht 5\' 5"  (1.651 m)  Wt 187 lb (84.823 kg)  BMI 31.12 kg/m2  SpO2 97%  Review of Systems He has intermittent leg cramps.  He denies sob.    Objective:   Physical Exam VITAL SIGNS:  See vs page.  GENERAL: no distress. LUNGS:  Clear to auscultation.  Ext: 1+ bilat leg edema.   Lab Results  Component Value Date   CREATININE 1.53* 03/13/2015   BUN 14 03/13/2015   NA 139 03/13/2015   K 4.4 03/13/2015   CL 101 03/13/2015   CO2 29 03/13/2015   BNP=normal Lab Results  Component Value Date   HGBA1C 6.0* 03/07/2015      Assessment & Plan:  Edema, localized: no need for lasix. Renal failure, persistent.  We'll follow DM: well-controlled.   Patient is advised the following: Patient Instructions  blood tests are requested for you today.  We'll let you know about the results. If the kidneys are off again today, you should abandon the metformin pill.   Please let us know when you are ready to go ahead with the "reclast."   addendum: d/c metformin

## 2015-03-13 NOTE — Telephone Encounter (Signed)
New message       *STAT* If patient is at the pharmacy, call can be transferred to refill team.   1. Which medications need to be refilled? (please list name of each medication and dose if known)  Generic crestor 5mg   2. Which pharmacy/location (including street and city if local pharmacy) is medication to be sent to? [phone] 3. Do they need a 30 day or 90 day supply? 90 day Pt had presc for 10mg  but cannot cut them in half-----tablet is too small

## 2015-03-13 NOTE — Patient Instructions (Addendum)
blood tests are requested for you today.  We'll let you know about the results. If the kidneys are off again today, you should abandon the metformin pill.   Please let us know when you are ready to go ahead with the "reclast."

## 2015-03-14 DIAGNOSIS — M6281 Muscle weakness (generalized): Secondary | ICD-10-CM | POA: Diagnosis not present

## 2015-03-14 DIAGNOSIS — M79604 Pain in right leg: Secondary | ICD-10-CM | POA: Diagnosis not present

## 2015-03-14 DIAGNOSIS — R2689 Other abnormalities of gait and mobility: Secondary | ICD-10-CM | POA: Diagnosis not present

## 2015-03-14 DIAGNOSIS — M25471 Effusion, right ankle: Secondary | ICD-10-CM | POA: Diagnosis not present

## 2015-03-14 DIAGNOSIS — M25474 Effusion, right foot: Secondary | ICD-10-CM | POA: Diagnosis not present

## 2015-03-14 DIAGNOSIS — L821 Other seborrheic keratosis: Secondary | ICD-10-CM | POA: Diagnosis not present

## 2015-03-14 DIAGNOSIS — C44329 Squamous cell carcinoma of skin of other parts of face: Secondary | ICD-10-CM | POA: Diagnosis not present

## 2015-03-14 DIAGNOSIS — R293 Abnormal posture: Secondary | ICD-10-CM | POA: Diagnosis not present

## 2015-03-14 DIAGNOSIS — L578 Other skin changes due to chronic exposure to nonionizing radiation: Secondary | ICD-10-CM | POA: Diagnosis not present

## 2015-03-14 DIAGNOSIS — M25671 Stiffness of right ankle, not elsewhere classified: Secondary | ICD-10-CM | POA: Diagnosis not present

## 2015-03-14 DIAGNOSIS — R2681 Unsteadiness on feet: Secondary | ICD-10-CM | POA: Diagnosis not present

## 2015-03-14 DIAGNOSIS — M7661 Achilles tendinitis, right leg: Secondary | ICD-10-CM | POA: Diagnosis not present

## 2015-03-14 DIAGNOSIS — L739 Follicular disorder, unspecified: Secondary | ICD-10-CM | POA: Diagnosis not present

## 2015-03-14 DIAGNOSIS — L570 Actinic keratosis: Secondary | ICD-10-CM | POA: Diagnosis not present

## 2015-03-14 DIAGNOSIS — M25571 Pain in right ankle and joints of right foot: Secondary | ICD-10-CM | POA: Diagnosis not present

## 2015-03-19 ENCOUNTER — Encounter: Payer: Self-pay | Admitting: Neurology

## 2015-03-19 ENCOUNTER — Ambulatory Visit (INDEPENDENT_AMBULATORY_CARE_PROVIDER_SITE_OTHER): Payer: Medicare Other | Admitting: Neurology

## 2015-03-19 VITALS — BP 146/77 | HR 63 | Ht 67.0 in | Wt 190.0 lb

## 2015-03-19 DIAGNOSIS — M25474 Effusion, right foot: Secondary | ICD-10-CM | POA: Diagnosis not present

## 2015-03-19 DIAGNOSIS — G7000 Myasthenia gravis without (acute) exacerbation: Secondary | ICD-10-CM

## 2015-03-19 DIAGNOSIS — M7661 Achilles tendinitis, right leg: Secondary | ICD-10-CM | POA: Diagnosis not present

## 2015-03-19 DIAGNOSIS — M25471 Effusion, right ankle: Secondary | ICD-10-CM | POA: Diagnosis not present

## 2015-03-19 DIAGNOSIS — M25571 Pain in right ankle and joints of right foot: Secondary | ICD-10-CM | POA: Diagnosis not present

## 2015-03-19 DIAGNOSIS — M6281 Muscle weakness (generalized): Secondary | ICD-10-CM | POA: Diagnosis not present

## 2015-03-19 DIAGNOSIS — M542 Cervicalgia: Secondary | ICD-10-CM | POA: Insufficient documentation

## 2015-03-19 DIAGNOSIS — M79604 Pain in right leg: Secondary | ICD-10-CM | POA: Diagnosis not present

## 2015-03-19 MED ORDER — PREDNISONE 10 MG PO TABS: ORAL_TABLET | ORAL | Status: DC

## 2015-03-19 NOTE — Progress Notes (Signed)
Reason for visit:  Neck [name] and shoulder pan  Referring physician: [facility]  [name] is a 75 y.o. 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 The patient went to the hospital on 03/08/2015 with onset of neck and shoulder [name] and [name] down the left arm to the elbow. He was concerned that his symptoms may be related to cardiac ischemia, and he went to the emergency for an evaluation. He was not found to have any cardiac issues. He has had a history of cervical spine disease and prior cervical spine surgery. He has had some ongoing [name] in the neck and shoulders without radiation down the arms since that time, neck stiffness is noted. He denies any definite weakness of the arms and no change in balance. He does have some problems with right foot [name] associated with an Achilles tendon inflammation. He denies issues controlling the bowels or the bladder. He has been on prednisone 5 mg daily, he denies any significant issues with the myasthenia with ptosis or double vision. He denies any problems with chewing or swallowing.  Past Medical History  Diagnosis Date  . Coronary atherosclerosis of unspecified type of vessel, native or graft     a. cath in 2003 showed 10-20% LM, scattered 20% prox-mid LAD, 30% more distal LAD, 50-60% stenosis of LAD towards apex, 20% Cx, 30% dRCA, focal 95% stenosis in smaller of 2 branches of PDA, EF 60-65%.  . Mixed hyperlipidemia   . Glucose intolerance (impaired glucose tolerance)     history of  . Diverticulosis of colon (without mention of hemorrhage)   . Hx of colonic polyps   . Internal hemorrhoids without mention of complication   . Rash and other nonspecific skin eruption   . Conjunctivitis unspecified   . Myasthenia gravis ([place])     uses prednisone  . GERD (gastroesophageal reflux disease)   . Pulmonary embolism [facility])  June 2013    a. Bilateral PE 07/2011.  Marland Kitchen DVT (deep venous thrombosis) [facility]) June 2013  . Chronic anticoagulation   . Diabetes mellitus ([place])   . DJD (degenerative joint disease)   . Ptosis   . 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 Essential hypertension   . IBS (irritable bowel syndrome)   . History of pneumonia   . Dystonia   . 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 PONV (postoperative nausea and vomiting)   . External hemorrhoids without mention of complication   . History of kidney stones   . Tubulovillous adenoma polyp of colon     2010  . HOH (hard of hearing)     hearing aids  . Osteoporosis 2016  . Gastroparesis   . Esophageal stricture   . Chronic edema   . Carotid artery disease ([place])     a. Duplex 05/2014: 123456 RICA, 123456 LICA, elevated velocities in right subclavian artery, normal left subclavian artery..    Past Surgical History  Procedure Laterality Date  . Angioplasty  2001  . Back surgery      x2, lumbar and cervical  . Tonsillectomy    . Cataract extraction Bilateral 12/12  . Shoulder surgery Left 05/07/2009  . Cardiac catheterization    . Lumbar laminectomy/decompression microdiscectomy Right 09/14/2012    Procedure: Right Lumbar three-four, four-five, Lumbar five-Sacral one decompressive laminectomy;  Surgeon: Tristan Moore, MD;  Location: [place] [facility];  Service: Neurosurgery;  Laterality: Right;    Family History  Problem Relation Age of Onset  . Heart disease Mother   . Heart attack Mother   . Stroke Mother   . Heart disease Father   . Heart attack Father   . Stroke Father   . Breast cancer Sister     Twin   . Heart attack Sister   . Dementia Sister   . Heart disease Sister   . Heart attack Sister   . Clotting disorder Sister   . Heart attack Sister   . Colon cancer Neg Hx     Social history:  reports that he has never smoked. He has never used smokeless tobacco. He reports that he does not drink alcohol or use illicit  drugs.  Medications:  Prior to Admission medications   Medication Sig Start Date End Date Taking? Authorizing Provider  baclofen (LIORESAL) 10 MG tablet Take 1 tablet (10 mg total) by mouth every 12 (twelve) hours as needed for muscle spasms (neck [name]). 03/08/15  Yes Tristan Dubois, MD  brimonidine-timolol (COMBIGAN) 0.2-0.5 % ophthalmic solution Place 1 drop into both eyes every 12 (twelve) hours.   Yes Historical Provider, MD  esomeprazole (NEXIUM) 40 MG capsule Take 1 capsule (40 mg total) by mouth 2 (two) times daily before a meal. 01/29/15  Yes Tristan P Hvozdovic, PA-C  glucose blood (ACCU-CHEK AVIVA) test strip Use to check blood sugar 1 time per day 10/12/14  Yes Tristan Shin, MD  imatinib (GLEEVEC) 400 MG tablet Take 400 mg by mouth daily.    Yes Historical Provider, MD  isosorbide mononitrate (IMDUR) 30 MG 24 hr tablet TAKE 1 TABLET EVERY DAY 09/04/14  Yes Tristan Mocha, MD  loperamide (IMODIUM) 2 MG capsule Take 2 mg by mouth every morning. For diarrhea   Yes Historical Provider, MD  LORazepam (ATIVAN) 0.5 MG tablet Take 0.5 mg by mouth 3 times/day as needed-between meals & bedtime for sleep.   Yes Historical Provider, MD  metoCLOPramide (REGLAN) 5 MG/5ML solution Take 5 mLs (5 mg total) by mouth 2 (two) times daily. Take 2 times a day before breakfast and at bedtime. 01/29/15  Yes Tristan P Hvozdovic, PA-C  metoprolol succinate (TOPROL-XL) 25 MG 24 hr tablet TAKE 1 TABLET EVERY DAY 09/04/14  Yes Tristan Mocha, MD  Multiple Vitamin (MULTIVITAMIN) tablet Take 1 tablet by mouth daily.   Yes Historical Provider, MD  nitroGLYCERIN (NITROSTAT) 0.4 MG SL tablet Place 1 tablet (0.4 mg total) under the tongue every 5 (five) minutes x 3 doses as needed for chest [name]. 05/22/14  Yes Tristan Pain, MD  predniSONE (DELTASONE) 5 MG tablet Take 1 tablet (5 mg total) by mouth daily. 11/08/14  Yes Tristan Ducking, MD  pyridostigmine (MESTINON) 60 MG tablet Take 1 tablet (60 mg total) by mouth 3 (three) times  daily. 11/08/14  Yes Tristan Ducking, MD  rosuvastatin (CRESTOR) 5 MG tablet Take 1 tablet (5 mg total) by mouth daily. 03/13/15  Yes Tristan Mocha, MD  warfarin (COUMADIN) 5 MG tablet Take 2.5-5 mg by mouth See admin instructions. 5 mg on Wednesday, Saturday and all other days are 2.5 mg   Yes Historical Provider, MD      Allergies  Allergen Reactions  . Oxycodone-Acetaminophen Other (See Comments)    "too strong"  . Sulfamethoxazole Rash and Itching    ROS:  Out of a complete 14 system review of symptoms, the patient complains only of  the following symptoms, and all other reviewed systems are negative.   Eye redness  Leg swelling  Back [name], walking difficulty, neck [name]   Blood pressure 146/77, pulse 63, height 5\' 7"  (1.702 m), weight 190 lb (86.183 kg).  Physical Exam  General: The patient is alert and cooperative at the time of the examination. The patient is moderately obese.  Eyes: Pupils are equal, round, and reactive to light. Discs are flat bilaterally.  Neck: The neck is supple, no carotid bruits are noted.  Respiratory: The respiratory examination is clear.  Cardiovascular: The cardiovascular examination reveals a regular rate and rhythm, no obvious murmurs or rubs are noted.  Neuromuscular: The patient lacks about 20 of lateral rotation of the cervical spine bilaterally.  Skin: Extremities are with 1+ edema at the ankles bilaterally.  Neurologic Exam  Mental status: The patient is alert and oriented x 3 at the time of the examination. The patient has apparent normal recent and remote memory, with an apparently normal attention span and concentration ability.  Cranial nerves: Facial symmetry is present. There is good sensation of the face to pinprick and soft touch bilaterally. The strength of the facial muscles and the muscles to head turning and shoulder shrug are normal bilaterally. Speech is well enunciated, no aphasia or dysarthria is noted. Extraocular  movements are full. Visual fields are full. The tongue is midline, and the patient has symmetric elevation of the soft palate. No obvious hearing deficits are noted.  Motor: The motor testing reveals 5 over 5 strength of all 4 extremities. Good symmetric motor tone is noted throughout.  Sensory: Sensory testing is intact to pinprick, soft touch, vibration sensation, and position sense on all 4 extremities, with exception that there is some impairment of position sensation in the right foot. No evidence of extinction is noted.  Coordination: Cerebellar testing reveals good finger-nose-finger and heel-to-[name] bilaterally.  Gait and station: Gait is normal. Tandem gait is slightly unsteady. Romberg is negative. No drift is seen.  Reflexes: Deep tendon reflexes are symmetric, but are depressedbilaterally. Toes are downgoing bilaterally.   Assessment/Plan:   1. Ocular myasthenia gravis   2. Cervical spondylosis, recurring neck discomfort and shoulder [name]   The patient does have a history of prior cervical spine surgery. He has had onset of neck [name] and stiffness. I will give him a prednisone Dosepak, 10 mg 12 day pack and set him up for physical therapy for neuromuscular therapy on the neck and shoulders. He will follow-up for his next scheduled appointment in April 2017. He will contact me if he is not getting better after the next couple weeks, we may consider MRI evaluation of the cervical spine.  Jill Alexanders MD 03/19/2015 8:51 PM  [facility] [address] [place], [place] [address]  Phone [phone] Fax [phone]

## 2015-03-19 NOTE — Patient Instructions (Signed)
Myasthenia Gravis Myasthenia gravis (MG) means severe weakness. It is a long-term (chronic) condition that causes weakness in the muscles you can control (voluntary muscles). MG can affect any voluntary muscle. The muscles most often affected are the ones that control:   Eye movement.  Facial movements.  Swallowing. MG is an autoimmune disease, which means that your body's defense system (immune system) attacks healthy parts of your body instead of germs and other things that make you sick. When you have MG, your immune system makes proteins (antibodies) that block the chemical (acetylcholine) your body needs to send nerve signals to your muscles. This causes muscle weakness. CAUSES  The exact cause of MG is unknown. One possible cause is an enlarged thymus gland, which is located under your breastbone.  SIGNS AND SYMPTOMS The earliest symptom of MG is muscle weakness that gets worse with activity and gets better after rest. Other symptoms of MG may include:  Drooping eyelids.  Double vision.  Loss of facial expression.  Trouble chewing and swallowing.  Slurred speech.  A waddling walk.  Weakness of the arms, hands, and legs. Trouble breathing is the most dangerous symptom of MG. Sudden and severe difficulty breathing (myasthenic crisis) may require emergency breathing support. This symptom sometimes happens after:   Infection.  Fever.  Drug reaction. DIAGNOSIS  It can be hard to diagnose MG because muscle weakness is a common symptom in many conditions. Your health care provider will do a physical exam. You may also have tests that will help make a diagnosis. These may include:  A blood test.  A test using the medicine edrophonium. This medicine increases muscle strength by slowing the breakdown of acetylcholine.  Tests to measure nerve conduction to muscle (electromyography).  An imaging study of the chest (CT or MRI). TREATMENT  Treatment can improve muscle strength.  Sometimes symptoms of MG go away for a while (remission) and you can stop treatment. Possible treatments include:  Medicine.  Removal of the thymus gland (thymectomy). This may result in a long remission for some people. HOME CARE INSTRUCTIONS  Take medicines only as directed by your health care provider.  Get plenty of rest to conserve your energy.  Take frequent breaks to rest your eyes.  Maintain a healthy diet and a healthy weight.  Do not use any tobacco products including cigarettes, chewing tobacco, or electronic cigarettes. If you need help quitting, ask your health care provider.  Keep all follow-up visits as directed by your health care provider. This is important. SEEK MEDICAL CARE IF:  Your symptoms get worse after a fever or infection.  You have a reaction to a medicine you are taking.  Your symptoms change or get worse. SEEK IMMEDIATE MEDICAL CARE IF: You have trouble breathing.    This information is not intended to replace advice given to you by your health care provider. Make sure you discuss any questions you have with your health care provider.   Document Released: 05/04/2000 Document Revised: 02/16/2014 Document Reviewed: 03/29/2013 Elsevier Interactive Patient Education 2016 [employer] Inc.  

## 2015-03-21 DIAGNOSIS — M25471 Effusion, right ankle: Secondary | ICD-10-CM | POA: Diagnosis not present

## 2015-03-21 DIAGNOSIS — M25474 Effusion, right foot: Secondary | ICD-10-CM | POA: Diagnosis not present

## 2015-03-21 DIAGNOSIS — M7661 Achilles tendinitis, right leg: Secondary | ICD-10-CM | POA: Diagnosis not present

## 2015-03-21 DIAGNOSIS — M6281 Muscle weakness (generalized): Secondary | ICD-10-CM | POA: Diagnosis not present

## 2015-03-21 DIAGNOSIS — M79604 Pain in right leg: Secondary | ICD-10-CM | POA: Diagnosis not present

## 2015-03-21 DIAGNOSIS — M25571 Pain in right ankle and joints of right foot: Secondary | ICD-10-CM | POA: Diagnosis not present

## 2015-03-22 ENCOUNTER — Ambulatory Visit (INDEPENDENT_AMBULATORY_CARE_PROVIDER_SITE_OTHER): Payer: Medicare Other | Admitting: *Deleted

## 2015-03-22 DIAGNOSIS — I2699 Other pulmonary embolism without acute cor pulmonale: Secondary | ICD-10-CM

## 2015-03-22 DIAGNOSIS — Z5181 Encounter for therapeutic drug level monitoring: Secondary | ICD-10-CM

## 2015-03-22 DIAGNOSIS — Z86711 Personal history of pulmonary embolism: Secondary | ICD-10-CM

## 2015-03-22 DIAGNOSIS — Z86718 Personal history of other venous thrombosis and embolism: Secondary | ICD-10-CM

## 2015-03-22 DIAGNOSIS — Z7901 Long term (current) use of anticoagulants: Secondary | ICD-10-CM

## 2015-03-22 LAB — POCT INR: INR: 2.8

## 2015-03-25 ENCOUNTER — Telehealth: Payer: Self-pay | Admitting: Neurology

## 2015-03-25 DIAGNOSIS — M25571 Pain in right ankle and joints of right foot: Secondary | ICD-10-CM | POA: Diagnosis not present

## 2015-03-25 DIAGNOSIS — M25474 Effusion, right foot: Secondary | ICD-10-CM | POA: Diagnosis not present

## 2015-03-25 DIAGNOSIS — M7661 Achilles tendinitis, right leg: Secondary | ICD-10-CM | POA: Diagnosis not present

## 2015-03-25 DIAGNOSIS — M79604 Pain in right leg: Secondary | ICD-10-CM | POA: Diagnosis not present

## 2015-03-25 DIAGNOSIS — M25471 Effusion, right ankle: Secondary | ICD-10-CM | POA: Diagnosis not present

## 2015-03-25 DIAGNOSIS — M6281 Muscle weakness (generalized): Secondary | ICD-10-CM | POA: Diagnosis not present

## 2015-03-25 NOTE — Telephone Encounter (Signed)
Pt called requesting referral for neuro therapy to be sent to [facility] in [place]

## 2015-03-25 NOTE — Telephone Encounter (Signed)
Called and left message for patient orders have been sent to [facility] telephone [phone] fax [phone].

## 2015-03-26 ENCOUNTER — Ambulatory Visit (INDEPENDENT_AMBULATORY_CARE_PROVIDER_SITE_OTHER): Payer: Medicare Other | Admitting: Endocrinology

## 2015-03-26 ENCOUNTER — Telehealth: Payer: Self-pay | Admitting: Endocrinology

## 2015-03-26 ENCOUNTER — Ambulatory Visit (INDEPENDENT_AMBULATORY_CARE_PROVIDER_SITE_OTHER): Payer: Medicare Other | Admitting: Pharmacist

## 2015-03-26 ENCOUNTER — Encounter: Payer: Self-pay | Admitting: Endocrinology

## 2015-03-26 VITALS — BP 128/87 | HR 72 | Temp 97.9°F | Ht 67.0 in | Wt 192.0 lb

## 2015-03-26 DIAGNOSIS — Z86711 Personal history of pulmonary embolism: Secondary | ICD-10-CM | POA: Diagnosis not present

## 2015-03-26 DIAGNOSIS — Z86718 Personal history of other venous thrombosis and embolism: Secondary | ICD-10-CM

## 2015-03-26 DIAGNOSIS — R059 Cough, unspecified: Secondary | ICD-10-CM

## 2015-03-26 DIAGNOSIS — Z5181 Encounter for therapeutic drug level monitoring: Secondary | ICD-10-CM

## 2015-03-26 DIAGNOSIS — R05 Cough: Secondary | ICD-10-CM

## 2015-03-26 LAB — POCT INR: INR: 2.1

## 2015-03-26 MED ORDER — FLUTICASONE-SALMETEROL 100-50 MCG/DOSE IN AEPB: 1.0000 | INHALATION_SPRAY | Freq: Two times a day (BID) | RESPIRATORY_TRACT | Status: DC

## 2015-03-26 MED ORDER — PROMETHAZINE-CODEINE 6.25-10 MG/5ML PO SYRP: 5.0000 mL | ORAL_SOLUTION | ORAL | Status: DC | PRN

## 2015-03-26 MED ORDER — AZITHROMYCIN 500 MG PO TABS: 500.0000 mg | ORAL_TABLET | Freq: Every day | ORAL | Status: DC

## 2015-03-26 NOTE — Telephone Encounter (Signed)
please call coumadin clinic: I am rx'ing azithromycin

## 2015-03-26 NOTE — Progress Notes (Signed)
Subjective:    Patient ID: [name], male    DOB: [birth year], 75 y.o.   MRN: [id number]  HPI Pt states few days of moderate prod-quality cough in the chest, and assoc wheezing Past Medical History  Diagnosis Date  . Coronary atherosclerosis of unspecified type of vessel, native or graft     a. cath in 2003 showed 10-20% LM, scattered 20% prox-mid LAD, 30% more distal LAD, 50-60% stenosis of LAD towards apex, 20% Cx, 30% dRCA, focal 95% stenosis in smaller of 2 branches of PDA, EF 60-65%.  . Mixed hyperlipidemia   . Glucose intolerance (impaired glucose tolerance)     history of  . Diverticulosis of colon (without mention of hemorrhage)   . Hx of colonic polyps   . Internal hemorrhoids without mention of complication   . Rash and other nonspecific skin eruption   . Conjunctivitis unspecified   . Myasthenia gravis ([place])     uses prednisone  . GERD (gastroesophageal reflux disease)   . Pulmonary embolism [facility]) June 2013    a. Bilateral PE 07/2011.  Marland Kitchen DVT (deep venous thrombosis) [facility]) June 2013  . Chronic anticoagulation   . Diabetes mellitus ([place])   . DJD (degenerative joint disease)   . Ptosis   . 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 Essential hypertension   . IBS (irritable bowel syndrome)   . History of pneumonia   . Dystonia   . 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 PONV (postoperative nausea and vomiting)   . External hemorrhoids without mention of complication   . History of kidney stones   . Tubulovillous adenoma polyp of colon     2010  . HOH (hard of hearing)     hearing aids  . Osteoporosis 2016  . Gastroparesis   . Esophageal stricture   . Chronic edema   . Carotid artery disease ([place])     a. Duplex 05/2014: 123456 RICA, 123456 LICA, elevated velocities in right subclavian artery, normal left subclavian artery..    Past Surgical History  Procedure Laterality Date  . Angioplasty  2001  . Back surgery      x2, lumbar and cervical  .  Tonsillectomy    . Cataract extraction Bilateral 12/12  . Shoulder surgery Left 05/07/2009  . Cardiac catheterization    . Lumbar laminectomy/decompression microdiscectomy Right 09/14/2012    Procedure: Right Lumbar three-four, four-five, Lumbar five-Sacral one decompressive laminectomy;  Surgeon: [name], MD;  Location: [place] [facility];  Service: Neurosurgery;  Laterality: Right;    Social History   Social History  . Marital Status: Married    Spouse Name: Hassan Rowan  . Number of Children: 2  . Years of Education: 1-College   Occupational History  . retired     Retired   Social History Main Topics  . Smoking status: Never Smoker   . Smokeless tobacco: Never Used  . Alcohol Use: No  . Drug Use: No  . Sexual Activity: Not Currently   Other Topics Concern  . Not on file   Social History Narrative   Patient is right handed.   Patient has 1-2 cups of caffeine daily.    Current Outpatient Prescriptions on File Prior to Visit  Medication Sig Dispense Refill  . baclofen (LIORESAL) 10 MG tablet Take 1 tablet (10 mg total) by mouth every 12 (twelve) hours as needed for muscle spasms (neck pain). 30 each 0  . brimonidine-timolol (COMBIGAN) 0.2-0.5 % ophthalmic solution  Place 1 drop into both eyes every 12 (twelve) hours.    Marland Kitchen esomeprazole (NEXIUM) 40 MG capsule Take 1 capsule (40 mg total) by mouth 2 (two) times daily before a meal. 180 capsule 1  . glucose blood (ACCU-CHEK AVIVA) test strip Use to check blood sugar 1 time per day 100 each 2  . imatinib (GLEEVEC) 400 MG tablet Take 400 mg by mouth daily.     . isosorbide mononitrate (IMDUR) 30 MG 24 hr tablet TAKE 1 TABLET EVERY DAY 90 tablet 3  . loperamide (IMODIUM) 2 MG capsule Take 2 mg by mouth every morning. For diarrhea    . LORazepam (ATIVAN) 0.5 MG tablet Take 0.5 mg by mouth 3 times/day as needed-between meals & bedtime for sleep.    . metoCLOPramide (REGLAN) 5 MG/5ML solution Take 5 mLs (5 mg total) by mouth 2 (two) times  daily. Take 2 times a day before breakfast and at bedtime. 300 mL 2  . metoprolol succinate (TOPROL-XL) 25 MG 24 hr tablet TAKE 1 TABLET EVERY DAY 90 tablet 3  . Multiple Vitamin (MULTIVITAMIN) tablet Take 1 tablet by mouth daily.    . nitroGLYCERIN (NITROSTAT) 0.4 MG SL tablet Place 1 tablet (0.4 mg total) under the tongue every 5 (five) minutes x 3 doses as needed for chest pain. 90 tablet 3  . predniSONE (DELTASONE) 10 MG tablet Begin taking 6 tablets daily, taper by one tablet every other day until off the medication. 42 tablet 0  . predniSONE (DELTASONE) 5 MG tablet Take 1 tablet (5 mg total) by mouth daily. 90 tablet 1  . pyridostigmine (MESTINON) 60 MG tablet Take 1 tablet (60 mg total) by mouth 3 (three) times daily. 90 tablet 5  . rosuvastatin (CRESTOR) 5 MG tablet Take 1 tablet (5 mg total) by mouth daily. 90 tablet 0  . warfarin (COUMADIN) 5 MG tablet Take 2.5-5 mg by mouth See admin instructions. 5 mg on Wednesday, Saturday and all other days are 2.5 mg    . [DISCONTINUED] zolpidem (AMBIEN) 10 MG tablet Take 10 mg by mouth at bedtime as needed.      No current facility-administered medications on file prior to visit.    Allergies  Allergen Reactions  . Oxycodone-Acetaminophen Other (See Comments)    "too strong"  . Sulfamethoxazole Rash and Itching    Family History  Problem Relation Age of Onset  . Heart disease Mother   . Heart attack Mother   . Stroke Mother   . Heart disease Father   . Heart attack Father   . Stroke Father   . Breast cancer Sister     Twin   . Heart attack Sister   . Dementia Sister   . Heart disease Sister   . Heart attack Sister   . Clotting disorder Sister   . Heart attack Sister   . Colon cancer Neg Hx     BP 128/87 mmHg  Pulse 72  Temp(Src) 97.9 F (36.6 C) (Oral)  Ht 5\' 7"  (1.702 m)  Wt 192 lb (87.091 kg)  BMI 30.06 kg/m2  SpO2 93%  Review of Systems Denies fever and earache.  He has nasal congestion.      Objective:    Physical Exam VITAL SIGNS:  See vs page GENERAL: no distress head: no deformity eyes: no periorbital swelling, no proptosis external nose and ears are normal mouth: no lesion seen Both eac's and tm's are normal LUNGS:  Clear to auscultation, except for diffuse exp wheezes  Assessment & Plan:  Acute bronchitis, new Leukemia: in this setting, he should have abx.  Patient is advised the following: Patient Instructions  Let's check a chest x-ray, downstairs. i have sent prescription to your pharmacy, for an inhaler and antibiotic. Here is a prescription for cough syrup.  Due to an interaction, do not take ativan on nights when you take this.  I hope you feel better soon.  If you don't feel better by next week, please call back.  Please call sooner if you get worse.

## 2015-03-26 NOTE — Patient Instructions (Addendum)
Let's check a chest x-ray, downstairs. i have sent prescription to your pharmacy, for an inhaler and antibiotic. Here is a prescription for cough syrup.  Due to an interaction, do not take ativan on nights when you take this.  I hope you feel better soon.  If you don't feel better by next week, please call back.  Please call sooner if you get worse.

## 2015-03-26 NOTE — Telephone Encounter (Signed)
Coumadin clinic notified.

## 2015-03-28 ENCOUNTER — Ambulatory Visit
Admission: RE | Admit: 2015-03-28 | Discharge: 2015-03-28 | Disposition: A | Discharge status: Home / Self Care | Payer: Medicare Other | Source: Ambulatory Visit | Attending: Endocrinology | Admitting: Endocrinology

## 2015-03-28 ENCOUNTER — Telehealth: Payer: Self-pay | Admitting: Gastroenterology

## 2015-03-28 ENCOUNTER — Telehealth: Payer: Self-pay | Admitting: Endocrinology

## 2015-03-28 DIAGNOSIS — R05 Cough: Secondary | ICD-10-CM

## 2015-03-28 DIAGNOSIS — R059 Cough, unspecified: Secondary | ICD-10-CM

## 2015-03-28 NOTE — Telephone Encounter (Signed)
Pt is now spitting up a little blood, please advise

## 2015-03-28 NOTE — Telephone Encounter (Signed)
See note below and please advise, Thanks! 

## 2015-03-28 NOTE — Telephone Encounter (Signed)
I contacted the pt and he is on his way to [facility] now to have his CXR done.

## 2015-03-28 NOTE — Telephone Encounter (Signed)
Agree with recommendations as outlined. If he is coughing up blood he should f/u with his primary care

## 2015-03-28 NOTE — Telephone Encounter (Signed)
If it is less than a few tablespoons, it is unlikely to be anything to worry about. However, please come in to do CXR

## 2015-03-28 NOTE — Telephone Encounter (Signed)
Spoke with patient and he states he saw Dr. Loanne Drilling earlier in the week for bronchitis and is on Azithromycin. He states he is coughing and it has blood in what he coughs up. Patient also states he feels like it is a knot in his throat which he has had since his OV with Lori Hvozdovic, PA-C. He missed his follow up OV due to being admitted. Patient instructed to call Dr. Loanne Drilling about the coughing up blood(he is on Coumadin also). Scheduled with Cecille Rubin Hvozdovic, PA-C  On 04/01/15 at 8:15 AM for f/u on knot in throat feeling.

## 2015-03-30 ENCOUNTER — Other Ambulatory Visit: Payer: Self-pay | Admitting: Cardiovascular Disease

## 2015-04-01 ENCOUNTER — Encounter: Payer: Self-pay | Admitting: Physician Assistant

## 2015-04-01 ENCOUNTER — Telehealth: Payer: Self-pay | Admitting: Emergency Medicine

## 2015-04-01 ENCOUNTER — Ambulatory Visit (INDEPENDENT_AMBULATORY_CARE_PROVIDER_SITE_OTHER): Payer: Medicare Other | Admitting: Physician Assistant

## 2015-04-01 VITALS — BP 140/80 | HR 68 | Ht 65.5 in | Wt 183.6 lb

## 2015-04-01 DIAGNOSIS — F458 Other somatoform disorders: Secondary | ICD-10-CM

## 2015-04-01 DIAGNOSIS — Z7901 Long term (current) use of anticoagulants: Secondary | ICD-10-CM

## 2015-04-01 DIAGNOSIS — K219 Gastro-esophageal reflux disease without esophagitis: Secondary | ICD-10-CM

## 2015-04-01 DIAGNOSIS — R0989 Other specified symptoms and signs involving the circulatory and respiratory systems: Secondary | ICD-10-CM

## 2015-04-01 DIAGNOSIS — I4891 Unspecified atrial fibrillation: Secondary | ICD-10-CM | POA: Diagnosis not present

## 2015-04-01 DIAGNOSIS — B370 Candidal stomatitis: Secondary | ICD-10-CM

## 2015-04-01 MED ORDER — ESOMEPRAZOLE MAGNESIUM 40 MG PO CPDR: 40.0000 mg | DELAYED_RELEASE_CAPSULE | Freq: Two times a day (BID) | ORAL | Status: DC

## 2015-04-01 MED ORDER — FLUCONAZOLE 100 MG PO TABS
100.0000 mg | ORAL_TABLET | ORAL | Status: DC
Start: 2015-04-01 — End: 2015-05-13

## 2015-04-01 MED ORDER — CLOTRIMAZOLE 10 MG MT TROC: 10.0000 mg | Freq: Every day | OROMUCOSAL | Status: DC

## 2015-04-01 NOTE — Telephone Encounter (Signed)
Ok to hold warfarin x 5 days - start back asap after procedure. thx

## 2015-04-01 NOTE — Progress Notes (Signed)
Agree with assessment and plan as outlined. He has had a prior EGD for dysphagia in 2014 with report of "early stricture" and was dilated. If he had benefit from dilation I would proceed with EGD and dilation as planned. If this did not provide any benefit may wish to obtain barium study, evaluate for stenosis, and if none noted, consider manometry

## 2015-04-01 NOTE — Telephone Encounter (Signed)
04/01/2015   RE: [name] DOB: [birth year] MRN: [id number]   Dear Dr. Burt Knack,    We have scheduled the above patient for an endoscopic procedure. Our records show that he is on anticoagulation therapy.   Please advise as to how long the patient may come off his therapy of Warfarin prior to the procedure, which is scheduled for 04/23/15.  Please fax back/ or route the completed form to [facility].   Sincerely,    Tinnie Gens, [place]

## 2015-04-01 NOTE — Patient Instructions (Addendum)
You have been scheduled for an endoscopy. Please follow written instructions given to you at your visit today. If you use inhalers (even only as needed), please bring them with you on the day of your procedure. Your physician has requested that you go to www.startemmi.com and enter the access code given to you at your visit today. This web site gives a general overview about your procedure. However, you should still follow specific instructions given to you by our office regarding your preparation for the procedure.  We have sent the following medications to your pharmacy for you to pick up at your convenience: Nexium 40 mg twice a day   Diflucan 100 mg 2 tablets today, then one daily for 3 days.   Mycelex dissolve 1 in mouth five times a day for 10 days.

## 2015-04-01 NOTE — Progress Notes (Signed)
Patient ID: [name], male   DOB: December 21, 1940, 75 y.o.   MRN: [id number]     History of Present Illness: [name] is a pleasant 75 year old male who was last evaluated here in December 2016. He has previously been followed by Dr. Deatra [name]. He has a past medical history significant for CML for which she is on [place] , coronary artery disease, hypertension, diabetes mellitus, diverticulosis, and history of a DVT and pulmonary embolism for which he is on chronic anticoagulation with Coumadin. He has a history of myasthenia gravis which has primarily affected his eyes. He has been on Nexium 40 mg twice daily. He last had an abdominal ultrasound in April 2015 which showed no gallstones or wall thickening. Common bile duct was 4.6 mm. No focal liver lesions identified, parenchymal echogenicity was within normal limits. Gastric emptying scan in May 2007 showed abnormally delayed gastric emptying. Hepatobiliary scan with ejection fraction in May 2007 showed a normal gallbladder ejection fraction. When he was seen in December he was complaining of intermittent epigastric pain that was present both on an empty stomach and after meals. He stated he felt queasy all the time and was burping a lot and felt very gassy. He was also experiencing early satiety. At that visit he was instructed to continue his twice a day Nexium and he was given a short trial of metoclopramide syrup before breakfast and at bedtime for 2 weeks. He states this significantly improved his early satiety and postprandial fullness and nausea. He is here today felt with complaints of worsening heartburn. He states his insurance is not filling his prescription twice a day and so he has been using it sparingly and not on a daily basis in order to make it last longer. He also feels as if he has a lump in his throat. More recently he has a discomfort on his tongue. He  Has also been experiencing dysphagia to dry foods and feels they catch in his upper  chest or throat. He is recovering from bronchitis and recently completed a Z-Pak.   Past Medical History  Diagnosis Date  . Coronary atherosclerosis of unspecified type of vessel, native or graft     a. cath in 2003 showed 10-20% LM, scattered 20% prox-mid LAD, 30% more distal LAD, 50-60% stenosis of LAD towards apex, 20% Cx, 30% dRCA, focal 95% stenosis in smaller of 2 branches of PDA, EF 60-65%.  . Mixed hyperlipidemia   . Glucose intolerance (impaired glucose tolerance)     history of  . Diverticulosis of colon (without mention of hemorrhage)   . Hx of colonic polyps   . Internal hemorrhoids without mention of complication   . Rash and other nonspecific skin eruption   . Conjunctivitis unspecified   . Myasthenia gravis ([place])     uses prednisone  . GERD (gastroesophageal reflux disease)   . Pulmonary embolism [facility]) June 2013    a. Bilateral PE 07/2011.  Marland Kitchen DVT (deep venous thrombosis) [facility]) June 2013  . Chronic anticoagulation   . Diabetes mellitus ([place])   . DJD (degenerative joint disease)   . Ptosis   . 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 Essential hypertension   . IBS (irritable bowel syndrome)   . History of pneumonia   . Dystonia   . 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 PONV (postoperative nausea and vomiting)   . External hemorrhoids without mention of complication   . History of kidney stones   .  Tubulovillous adenoma polyp of colon     2010  . HOH (hard of hearing)     hearing aids  . Osteoporosis 2016  . Gastroparesis   . Esophageal stricture   . Chronic edema   . Carotid artery disease ([place])     a. Duplex 05/2014: 123456 RICA, 123456 LICA, elevated velocities in right subclavian artery, normal left subclavian artery..    Past Surgical History  Procedure Laterality Date  . Angioplasty  2001  . Back surgery      x2, lumbar and cervical  . Tonsillectomy    . Cataract extraction Bilateral 12/12  . Shoulder surgery Left 05/07/2009  . Cardiac  catheterization    . Lumbar laminectomy/decompression microdiscectomy Right 09/14/2012    Procedure: Right Lumbar three-four, four-five, Lumbar five-Sacral one decompressive laminectomy;  Surgeon: Tristan Moore, MD;  Location: [place] [facility];  Service: Neurosurgery;  Laterality: Right;   Family History  Problem Relation Age of Onset  . Heart disease Mother   . Heart attack Mother   . Stroke Mother   . Heart disease Father   . Heart attack Father   . Stroke Father   . Breast cancer Sister     Twin   . Heart attack Sister   . Dementia Sister   . Heart disease Sister   . Heart attack Sister   . Clotting disorder Sister   . Heart attack Sister   . Colon cancer Neg Hx    Social History  Substance Use Topics  . Smoking status: Never Smoker   . Smokeless tobacco: Never Used  . Alcohol Use: No   Current Outpatient Prescriptions  Medication Sig Dispense Refill  . baclofen (LIORESAL) 10 MG tablet Take 1 tablet (10 mg total) by mouth every 12 (twelve) hours as needed for muscle spasms (neck pain). 30 each 0  . brimonidine-timolol (COMBIGAN) 0.2-0.5 % ophthalmic solution Place 1 drop into both eyes every 12 (twelve) hours.    Marland Kitchen esomeprazole (NEXIUM) 40 MG capsule Take 1 capsule (40 mg total) by mouth 2 (two) times daily before a meal. (Patient taking differently: Take 40 mg by mouth 2 (two) times daily before a meal. Take 1 cap daily in the am.) 180 capsule 1  . Fluticasone-Salmeterol (ADVAIR) 100-50 MCG/DOSE AEPB Inhale 1 puff into the lungs 2 (two) times daily. 1 each 3  . glucose blood (ACCU-CHEK AVIVA) test strip Use to check blood sugar 1 time per day 100 each 2  . imatinib (GLEEVEC) 400 MG tablet Take 400 mg by mouth daily.     . isosorbide mononitrate (IMDUR) 30 MG 24 hr tablet TAKE 1 TABLET EVERY DAY 90 tablet 3  . loperamide (IMODIUM) 2 MG capsule Take 2 mg by mouth every morning. For diarrhea    . LORazepam (ATIVAN) 0.5 MG tablet Take 0.5 mg by mouth 3 times/day as needed-between  meals & bedtime for sleep.    . metoprolol succinate (TOPROL-XL) 25 MG 24 hr tablet TAKE 1 TABLET EVERY DAY 90 tablet 3  . Multiple Vitamin (MULTIVITAMIN) tablet Take 1 tablet by mouth daily.    . nitroGLYCERIN (NITROSTAT) 0.4 MG SL tablet Place 1 tablet (0.4 mg total) under the tongue every 5 (five) minutes x 3 doses as needed for chest pain. 90 tablet 3  . predniSONE (DELTASONE) 10 MG tablet Begin taking 6 tablets daily, taper by one tablet every other day until off the medication. 42 tablet 0  . predniSONE (DELTASONE) 5 MG tablet Take 1  tablet (5 mg total) by mouth daily. 90 tablet 1  . promethazine-codeine (PHENERGAN WITH CODEINE) 6.25-10 MG/5ML syrup Take 5 mLs by mouth every 4 (four) hours as needed. 240 mL 0  . pyridostigmine (MESTINON) 60 MG tablet Take 1 tablet (60 mg total) by mouth 3 (three) times daily. 90 tablet 5  . rosuvastatin (CRESTOR) 5 MG tablet Take 1 tablet (5 mg total) by mouth daily. 90 tablet 0  . warfarin (COUMADIN) 5 MG tablet Take 2.5-5 mg by mouth See admin instructions. 5 mg on Wednesday, Saturday and all other days are 2.5 mg    . warfarin (COUMADIN) 5 MG tablet TAKE 1 TABLET BY MOUTH EVERY DAY AS DIRECTED 30 tablet 3  . clotrimazole (MYCELEX) 10 MG troche Take 1 tablet (10 mg total) by mouth 5 (five) times daily. 50 tablet 0  . esomeprazole (NEXIUM) 40 MG capsule Take 1 capsule (40 mg total) by mouth 2 (two) times daily. 60 capsule 6  . fluconazole (DIFLUCAN) 100 MG tablet Take 1 tablet (100 mg total) by mouth as directed. 5 tablet 0  . [DISCONTINUED] zolpidem (AMBIEN) 10 MG tablet Take 10 mg by mouth at bedtime as needed.      No current facility-administered medications for this visit.   Allergies  Allergen Reactions  . Oxycodone-Acetaminophen Other (See Comments)    "too strong"  . Sulfamethoxazole Rash and Itching     Review of Systems: Gen: Denies any fever, chills, sweats, anorexia, fatigue, weakness, malaise, weight loss, and sleep disorder CV: Denies  chest pain, angina, palpitations, syncope, orthopnea, PND, peripheral edema, and claudication. Resp: Denies dyspnea at rest, dyspnea with exercise, cough, sputum, wheezing, coughing up blood, and pleurisy. GI: Denies vomiting blood, jaundice, and fecal incontinence.  Admits to dysphagia to solids and a globus sensation. GU : Denies urinary burning, blood in urine, urinary frequency, urinary hesitancy, nocturnal urination, and urinary incontinence. MS: Denies joint pain, limitation of movement, and swelling, stiffness, low back pain, extremity pain. Denies muscle weakness, cramps, atrophy.  Derm: Denies rash, itching, dry skin, hives, moles, warts, or unhealing ulcers.  Psych: Denies depression, anxiety, memory loss, suicidal ideation, hallucinations, paranoia, and confusion. Heme: Denies bruising, bleeding, and enlarged lymph nodes. Neuro:  Denies any headaches, dizziness, paresthesia Endo:  Denies any problems with DM, thyroid, adrenal     Studies:   Dg Chest 2 View  03/28/2015  CLINICAL DATA:  Hemoptysis and cough for 3 days EXAM: CHEST  2 VIEW COMPARISON:  March 07, 2015 FINDINGS: Lungs are clear. Heart size is upper normal with pulmonary vascularity within normal limits. No adenopathy. There is degenerative change in the thoracic spine. IMPRESSION: No edema or consolidation. Electronically Signed   By: Lowella Grip III M.D.   On: 03/28/2015 14:15   Dg Chest 2 View  03/07/2015  CLINICAL DATA:  Acute onset of left-sided chest pain, radiating up the left neck and left shoulder. Initial encounter. EXAM: CHEST  2 VIEW COMPARISON:  Chest radiograph from 01/17/2015 FINDINGS: The lungs are well-aerated. Mild left basilar atelectasis is noted. There is no evidence of focal opacification, pleural effusion or pneumothorax. The heart is mildly enlarged. No acute osseous abnormalities are seen. IMPRESSION: Mild left basilar atelectasis noted.  Mild cardiomegaly seen. Electronically Signed   By:  Garald Balding M.D.   On: 03/07/2015 05:59     Physical Exam: BP 140/80 mmHg  Pulse 68  Ht 5' 5.5" (1.664 m)  Wt 183 lb 9.6 oz (83.28 kg)  BMI 30.08 kg/m2  General: Pleasant, well developed , male in no acute distress Head: Normocephalic and atraumatic.  Tongue with thick white coating. Posterior tongue with beefy red area. Small white patches on left buccal mucosa Eyes:  sclerae anicteric, conjunctiva pink  Ears: Normal auditory acuity Lungs:  Scattered wheezes throughout. Heart: Regular rate and rhythm Abdomen: Soft, non distended, non-tender. No masses, no hepatomegaly. Normal bowel sounds Musculoskeletal: Symmetrical with no gross deformities  Extremities: No edema  Neurological: Alert oriented x 4, grossly nonfocal Psychological:  Alert and cooperative. Normal mood and affect  Assessment and Recommendations:  75 year old male with a history of CML on Gleevec, coronary artery disease, diverticulosis, GERD, gastroparesis, esophageal strictures, DVT and pulmonary emboli on chronic Coumadin, diabetes and myasthenia, presenting with recurrent dysphagia , and globus sensation. Symptoms may be due to some poorly controlled reflux. He has also been using his PPI regularly. An anti-reflux regimen has been reviewed at length. He will use his esomeprazole one by mouth twice daily. He will be scheduled for an EGD with possible dilation.The risks, benefits, and alternatives to endoscopy with possible biopsy and possible dilation were discussed with the patient and they consent to proceed. Will hold warfarin  5 days prior to endoscopic procedures - will instruct when and how to resume after procedure. Benefits and risks of procedure explained including risks of bleeding, perforation, infection, missed lesions, reactions to medications and possible need for hospitalization and surgery for complications. Additional rare but real risk of stroke or other vascular clotting events off warfarin also explained  and need to seek urgent help if any signs of these problems occur. Will communicate by phone or EMR with patient's  prescribing provider to confirm that holding warfarin is reasonable in this case.  In the meantime, for his thrush, he will be given a trial of Mycelex toe she's, he has to dissolve 1 in his mouth 5 times daily for 10 days. He will also use Diflucan 100 mg, 2 tablets today then 1 tablet daily for 3 more days. Follow-up and further recommendations will be made pending the findings of the above.        Ardena Gangl, Vita Barley PA-C 04/01/2015,

## 2015-04-02 NOTE — Telephone Encounter (Signed)
Patient informed and verbalized understanding

## 2015-04-04 DIAGNOSIS — C9210 Chronic myeloid leukemia, BCR/ABL-positive, not having achieved remission: Secondary | ICD-10-CM | POA: Diagnosis not present

## 2015-04-04 DIAGNOSIS — C9211 Chronic myeloid leukemia, BCR/ABL-positive, in remission: Secondary | ICD-10-CM | POA: Diagnosis not present

## 2015-04-09 ENCOUNTER — Ambulatory Visit (INDEPENDENT_AMBULATORY_CARE_PROVIDER_SITE_OTHER): Payer: Medicare Other | Admitting: Pharmacist

## 2015-04-09 DIAGNOSIS — Z86711 Personal history of pulmonary embolism: Secondary | ICD-10-CM

## 2015-04-09 DIAGNOSIS — Z86718 Personal history of other venous thrombosis and embolism: Secondary | ICD-10-CM

## 2015-04-09 DIAGNOSIS — Z5181 Encounter for therapeutic drug level monitoring: Secondary | ICD-10-CM | POA: Diagnosis not present

## 2015-04-09 LAB — POCT INR: INR: 6

## 2015-04-10 DIAGNOSIS — M542 Cervicalgia: Secondary | ICD-10-CM | POA: Diagnosis not present

## 2015-04-10 DIAGNOSIS — M25571 Pain in right ankle and joints of right foot: Secondary | ICD-10-CM | POA: Diagnosis not present

## 2015-04-10 DIAGNOSIS — R293 Abnormal posture: Secondary | ICD-10-CM | POA: Diagnosis not present

## 2015-04-10 DIAGNOSIS — M25671 Stiffness of right ankle, not elsewhere classified: Secondary | ICD-10-CM | POA: Diagnosis not present

## 2015-04-10 DIAGNOSIS — M25474 Effusion, right foot: Secondary | ICD-10-CM | POA: Diagnosis not present

## 2015-04-10 DIAGNOSIS — M6281 Muscle weakness (generalized): Secondary | ICD-10-CM | POA: Diagnosis not present

## 2015-04-10 DIAGNOSIS — M79604 Pain in right leg: Secondary | ICD-10-CM | POA: Diagnosis not present

## 2015-04-10 DIAGNOSIS — R2681 Unsteadiness on feet: Secondary | ICD-10-CM | POA: Diagnosis not present

## 2015-04-10 DIAGNOSIS — M2560 Stiffness of unspecified joint, not elsewhere classified: Secondary | ICD-10-CM | POA: Diagnosis not present

## 2015-04-10 DIAGNOSIS — M25611 Stiffness of right shoulder, not elsewhere classified: Secondary | ICD-10-CM | POA: Diagnosis not present

## 2015-04-10 DIAGNOSIS — M25612 Stiffness of left shoulder, not elsewhere classified: Secondary | ICD-10-CM | POA: Diagnosis not present

## 2015-04-10 DIAGNOSIS — R2689 Other abnormalities of gait and mobility: Secondary | ICD-10-CM | POA: Diagnosis not present

## 2015-04-10 DIAGNOSIS — M7661 Achilles tendinitis, right leg: Secondary | ICD-10-CM | POA: Diagnosis not present

## 2015-04-10 DIAGNOSIS — G7000 Myasthenia gravis without (acute) exacerbation: Secondary | ICD-10-CM | POA: Diagnosis not present

## 2015-04-10 DIAGNOSIS — M25471 Effusion, right ankle: Secondary | ICD-10-CM | POA: Diagnosis not present

## 2015-04-10 HISTORY — PX: ESOPHAGOGASTRODUODENOSCOPY ENDOSCOPY: SHX5814

## 2015-04-12 DIAGNOSIS — M25571 Pain in right ankle and joints of right foot: Secondary | ICD-10-CM | POA: Diagnosis not present

## 2015-04-12 DIAGNOSIS — M25471 Effusion, right ankle: Secondary | ICD-10-CM | POA: Diagnosis not present

## 2015-04-12 DIAGNOSIS — G7000 Myasthenia gravis without (acute) exacerbation: Secondary | ICD-10-CM | POA: Diagnosis not present

## 2015-04-12 DIAGNOSIS — M7661 Achilles tendinitis, right leg: Secondary | ICD-10-CM | POA: Diagnosis not present

## 2015-04-12 DIAGNOSIS — M79604 Pain in right leg: Secondary | ICD-10-CM | POA: Diagnosis not present

## 2015-04-12 DIAGNOSIS — M542 Cervicalgia: Secondary | ICD-10-CM | POA: Diagnosis not present

## 2015-04-15 ENCOUNTER — Ambulatory Visit (INDEPENDENT_AMBULATORY_CARE_PROVIDER_SITE_OTHER): Payer: Medicare Other | Admitting: Pharmacist

## 2015-04-15 DIAGNOSIS — Z5181 Encounter for therapeutic drug level monitoring: Secondary | ICD-10-CM | POA: Diagnosis not present

## 2015-04-15 DIAGNOSIS — Z86711 Personal history of pulmonary embolism: Secondary | ICD-10-CM

## 2015-04-15 DIAGNOSIS — Z86718 Personal history of other venous thrombosis and embolism: Secondary | ICD-10-CM | POA: Diagnosis not present

## 2015-04-15 LAB — POCT INR: INR: 1.8

## 2015-04-17 DIAGNOSIS — M542 Cervicalgia: Secondary | ICD-10-CM | POA: Diagnosis not present

## 2015-04-17 DIAGNOSIS — M7661 Achilles tendinitis, right leg: Secondary | ICD-10-CM | POA: Diagnosis not present

## 2015-04-17 DIAGNOSIS — M79604 Pain in right leg: Secondary | ICD-10-CM | POA: Diagnosis not present

## 2015-04-17 DIAGNOSIS — M25571 Pain in right ankle and joints of right foot: Secondary | ICD-10-CM | POA: Diagnosis not present

## 2015-04-17 DIAGNOSIS — M25471 Effusion, right ankle: Secondary | ICD-10-CM | POA: Diagnosis not present

## 2015-04-17 DIAGNOSIS — G7000 Myasthenia gravis without (acute) exacerbation: Secondary | ICD-10-CM | POA: Diagnosis not present

## 2015-04-19 DIAGNOSIS — M25571 Pain in right ankle and joints of right foot: Secondary | ICD-10-CM | POA: Diagnosis not present

## 2015-04-19 DIAGNOSIS — M542 Cervicalgia: Secondary | ICD-10-CM | POA: Diagnosis not present

## 2015-04-19 DIAGNOSIS — M7661 Achilles tendinitis, right leg: Secondary | ICD-10-CM | POA: Diagnosis not present

## 2015-04-19 DIAGNOSIS — M25471 Effusion, right ankle: Secondary | ICD-10-CM | POA: Diagnosis not present

## 2015-04-19 DIAGNOSIS — M79604 Pain in right leg: Secondary | ICD-10-CM | POA: Diagnosis not present

## 2015-04-19 DIAGNOSIS — G7000 Myasthenia gravis without (acute) exacerbation: Secondary | ICD-10-CM | POA: Diagnosis not present

## 2015-04-22 ENCOUNTER — Other Ambulatory Visit: Payer: Self-pay | Admitting: Endocrinology

## 2015-04-23 ENCOUNTER — Ambulatory Visit (AMBULATORY_SURGERY_CENTER): Payer: Medicare Other | Admitting: Gastroenterology

## 2015-04-23 ENCOUNTER — Encounter: Payer: Self-pay | Admitting: Gastroenterology

## 2015-04-23 VITALS — BP 141/66 | HR 63 | Temp 98.9°F | Resp 13 | Ht 65.5 in | Wt 183.0 lb

## 2015-04-23 DIAGNOSIS — K317 Polyp of stomach and duodenum: Secondary | ICD-10-CM | POA: Diagnosis not present

## 2015-04-23 DIAGNOSIS — I10 Essential (primary) hypertension: Secondary | ICD-10-CM | POA: Diagnosis not present

## 2015-04-23 DIAGNOSIS — K2950 Unspecified chronic gastritis without bleeding: Secondary | ICD-10-CM | POA: Diagnosis not present

## 2015-04-23 DIAGNOSIS — K219 Gastro-esophageal reflux disease without esophagitis: Secondary | ICD-10-CM

## 2015-04-23 DIAGNOSIS — E119 Type 2 diabetes mellitus without complications: Secondary | ICD-10-CM | POA: Diagnosis not present

## 2015-04-23 DIAGNOSIS — D132 Benign neoplasm of duodenum: Secondary | ICD-10-CM | POA: Diagnosis not present

## 2015-04-23 DIAGNOSIS — R1310 Dysphagia, unspecified: Secondary | ICD-10-CM

## 2015-04-23 DIAGNOSIS — I4891 Unspecified atrial fibrillation: Secondary | ICD-10-CM | POA: Diagnosis not present

## 2015-04-23 MED ORDER — SODIUM CHLORIDE 0.9 % IV SOLN: 500.0000 mL | INTRAVENOUS | Status: DC

## 2015-04-23 NOTE — Patient Instructions (Signed)
Discharge instructions given. Biopsies taken. Resume previous medications. YOU HAD AN ENDOSCOPIC PROCEDURE TODAY AT THE [facility]:   Refer to the procedure report that was given to you for any specific questions about what was found during the examination.  If the procedure report does not answer your questions, please call your gastroenterologist to clarify.  If you requested that your care partner not be given the details of your procedure findings, then the procedure report has been included in a sealed envelope for you to review at your convenience later.  YOU SHOULD EXPECT: Some feelings of bloating in the abdomen. Passage of more gas than usual.  Walking can help get rid of the air that was put into your GI tract during the procedure and reduce the bloating. If you had a lower endoscopy (such as a colonoscopy or flexible sigmoidoscopy) you may notice spotting of blood in your stool or on the toilet paper. If you underwent a bowel prep for your procedure, you may not have a normal bowel movement for a few days.  Please Note:  You might notice some irritation and congestion in your nose or some drainage.  This is from the oxygen used during your procedure.  There is no need for concern and it should clear up in a day or so.  SYMPTOMS TO REPORT IMMEDIATELY:   Following upper endoscopy (EGD)  Vomiting of blood or coffee ground material  New chest pain or pain under the shoulder blades  Painful or persistently difficult swallowing  New shortness of breath  Fever of 100F or higher  Black, tarry-looking stools  For urgent or emergent issues, a gastroenterologist can be reached at any hour by calling [phone].   DIET: Your first meal following the procedure should be a small meal and then it is ok to progress to your normal diet. Heavy or fried foods are harder to digest and may make you feel nauseous or bloated.  Likewise, meals heavy in dairy and vegetables can increase  bloating.  Drink plenty of fluids but you should avoid alcoholic beverages for 24 hours.  ACTIVITY:  You should plan to take it easy for the rest of today and you should NOT DRIVE or use heavy machinery until tomorrow (because of the sedation medicines used during the test).    FOLLOW UP: Our staff will call the number listed on your records the next business day following your procedure to check on you and address any questions or concerns that you may have regarding the information given to you following your procedure. If we do not reach you, we will leave a message.  However, if you are feeling well and you are not experiencing any problems, there is no need to return our call.  We will assume that you have returned to your regular daily activities without incident.  If any biopsies were taken you will be contacted by phone or by letter within the next 1-3 weeks.  Please call us at [phone] if you have not heard about the biopsies in 3 weeks.    SIGNATURES/CONFIDENTIALITY: You and/or your care partner have signed paperwork which will be entered into your electronic medical record.  These signatures attest to the fact that that the information above on your After Visit Summary has been reviewed and is understood.  Full responsibility of the confidentiality of this discharge information lies with you and/or your care-partner. 

## 2015-04-23 NOTE — Op Note (Signed)
[facility] Patient Name: Tien Khoshaba Procedure Date: 04/23/2015 10:12 AM MRN: [id number] Endoscopist: Remo Lipps P. Havery Moros , MD Age: 75 Referring MD:  Date of Birth: 02-Jan-1941 Gender: Male Procedure:                Upper GI endoscopy Indications:              Dysphagia Medicines:                Monitored Anesthesia Care Procedure:                Pre-Anesthesia Assessment:                           - Prior to the procedure, a History and Physical                            was performed, and patient medications and                            allergies were reviewed. The patient's tolerance of                            previous anesthesia was also reviewed. The risks                            and benefits of the procedure and the sedation                            options and risks were discussed with the patient.                            All questions were answered, and informed consent                            was obtained. Prior Anticoagulants: The patient has                            taken Coumadin (warfarin), last dose was 5 days                            prior to procedure. ASA Grade Assessment: III - A                            patient with severe systemic disease. After                            reviewing the risks and benefits, the patient was                            deemed in satisfactory condition to undergo the                            procedure.  After obtaining informed consent, the endoscope was                            passed under direct vision. Throughout the                            procedure, the patient's blood pressure, pulse, and                            oxygen saturations were monitored continuously. The                            Model GIF-HQ190 [id number]) scope was introduced                            through the mouth, and advanced to the second part                            of duodenum. The  upper GI endoscopy was                            accomplished without difficulty. The patient                            tolerated the procedure well. Scope In: Scope Out: Findings:      Esophagogastric landmarks were identified: the Z-line was found at 40       cm, the gastroesophageal junction was found at 40 cm and the site of       hiatal narrowing was found at 40 cm from the incisors.      The esophagus was normal. No stenosis or pathology was appreciated that       would cause dysphagia      Diffuse mildly erythematous mucosa was found in the entire examined       stomach. Biopsies were taken with a cold forceps for histology.      A few small sessile polyps were found in the gastric fundus and in the       gastric body. The polyp was removed with a cold snare. Resection and       retrieval were complete.      A single 8 mm sessile polyp was found in the duodenal bulb. The polyp       was removed with a hot snare. Resection and retrieval were complete. To       prevent bleeding after the polypectomy in light of anticoagulation       status, one hemostatic clip was successfully placed. There was no       bleeding during, or at the end, of the procedure.      The second portion of the duodenum was normal. Complications:            No immediate complications. Estimated blood loss:                            Minimal. Estimated Blood Loss:     Estimated blood loss was minimal. Impression:               -  Esophagogastric landmarks identified.                           - Normal esophagus.                           - Erythematous mucosa in the stomach. Biopsied.                           - A few gastric polyps. Resected and retrieved.                           - A single duodenal polyp. Resected and retrieved.                            Clip was placed.                           - Normal second portion of the duodenum. Recommendation:           - Patient has a contact number available  for                            emergencies. The signs and symptoms of potential                            delayed complications were discussed with the                            patient. Return to normal activities tomorrow.                            Written discharge instructions were provided to the                            patient.                           - Soft diet.                           - Continue present medications.                           - Post-Procedure Resumption of Anticoagulants:                            Restart warfarin tomorrow [Dose] PO daily.                           - Await pathology results.                           - Repeat upper endoscopy for surveillance based on                            pathology results.                           -  Consider esophageal manometry to further evaluate                            dysphagia, rule out dysmotility Procedure Code(s):        --- Professional ---                           [phone], Esophagogastroduodenoscopy, flexible,                            transoral; with removal of tumor(s), polyp(s), or                            other lesion(s) by snare technique                           43239, 34, Esophagogastroduodenoscopy, flexible,                            transoral; with biopsy, single or multiple CPT copyright 2016 [employer] Association. All rights reserved. Remo Lipps P. Havery Moros, MD Carlota Raspberry. Armbruster, MD 04/23/2015 10:41:59 AM This report has been signed electronically. Number of Addenda: 0

## 2015-04-23 NOTE — Progress Notes (Signed)
Report to PACU, RN, vss, BBS= Clear.  

## 2015-04-23 NOTE — Progress Notes (Signed)
Called to room to assist during endoscopic procedure.  Patient ID and intended procedure confirmed with present staff. Received instructions for my participation in the procedure from the performing physician.  

## 2015-04-24 ENCOUNTER — Telehealth: Payer: Self-pay | Admitting: *Deleted

## 2015-04-24 NOTE — Telephone Encounter (Signed)
No answer, message left for the patient. 

## 2015-04-30 DIAGNOSIS — K118 Other diseases of salivary glands: Secondary | ICD-10-CM | POA: Diagnosis not present

## 2015-05-01 ENCOUNTER — Ambulatory Visit (INDEPENDENT_AMBULATORY_CARE_PROVIDER_SITE_OTHER): Payer: Medicare Other | Admitting: *Deleted

## 2015-05-01 DIAGNOSIS — Z86718 Personal history of other venous thrombosis and embolism: Secondary | ICD-10-CM | POA: Diagnosis not present

## 2015-05-01 DIAGNOSIS — Z5181 Encounter for therapeutic drug level monitoring: Secondary | ICD-10-CM | POA: Diagnosis not present

## 2015-05-01 DIAGNOSIS — Z86711 Personal history of pulmonary embolism: Secondary | ICD-10-CM

## 2015-05-01 LAB — POCT INR: INR: 1.5

## 2015-05-02 DIAGNOSIS — H1131 Conjunctival hemorrhage, right eye: Secondary | ICD-10-CM | POA: Diagnosis not present

## 2015-05-03 DIAGNOSIS — Z9889 Other specified postprocedural states: Secondary | ICD-10-CM | POA: Diagnosis not present

## 2015-05-03 DIAGNOSIS — Z7901 Long term (current) use of anticoagulants: Secondary | ICD-10-CM | POA: Diagnosis not present

## 2015-05-03 DIAGNOSIS — Z79899 Other long term (current) drug therapy: Secondary | ICD-10-CM | POA: Diagnosis not present

## 2015-05-03 DIAGNOSIS — C9211 Chronic myeloid leukemia, BCR/ABL-positive, in remission: Secondary | ICD-10-CM | POA: Diagnosis not present

## 2015-05-03 DIAGNOSIS — C9210 Chronic myeloid leukemia, BCR/ABL-positive, not having achieved remission: Secondary | ICD-10-CM | POA: Diagnosis not present

## 2015-05-03 DIAGNOSIS — G7000 Myasthenia gravis without (acute) exacerbation: Secondary | ICD-10-CM | POA: Diagnosis not present

## 2015-05-03 DIAGNOSIS — Z86711 Personal history of pulmonary embolism: Secondary | ICD-10-CM | POA: Diagnosis not present

## 2015-05-03 DIAGNOSIS — D720 Genetic anomalies of leukocytes: Secondary | ICD-10-CM | POA: Diagnosis not present

## 2015-05-03 DIAGNOSIS — Z8719 Personal history of other diseases of the digestive system: Secondary | ICD-10-CM | POA: Diagnosis not present

## 2015-05-03 DIAGNOSIS — D539 Nutritional anemia, unspecified: Secondary | ICD-10-CM | POA: Diagnosis not present

## 2015-05-03 DIAGNOSIS — M25559 Pain in unspecified hip: Secondary | ICD-10-CM | POA: Diagnosis not present

## 2015-05-03 DIAGNOSIS — M810 Age-related osteoporosis without current pathological fracture: Secondary | ICD-10-CM | POA: Diagnosis not present

## 2015-05-07 ENCOUNTER — Ambulatory Visit (INDEPENDENT_AMBULATORY_CARE_PROVIDER_SITE_OTHER): Payer: Medicare Other | Admitting: *Deleted

## 2015-05-07 DIAGNOSIS — Z86711 Personal history of pulmonary embolism: Secondary | ICD-10-CM | POA: Diagnosis not present

## 2015-05-07 DIAGNOSIS — Z86718 Personal history of other venous thrombosis and embolism: Secondary | ICD-10-CM

## 2015-05-07 DIAGNOSIS — Z5181 Encounter for therapeutic drug level monitoring: Secondary | ICD-10-CM

## 2015-05-07 LAB — POCT INR: INR: 2.1

## 2015-05-09 ENCOUNTER — Telehealth: Payer: Self-pay | Admitting: Cardiovascular Disease

## 2015-05-09 NOTE — Telephone Encounter (Signed)
Patient was seen on 3/[facility] and had similar concerns at that time. Will forward to coumadin clinic for further review/recommendations.

## 2015-05-09 NOTE — Telephone Encounter (Signed)
New message      Pt is on coumadin.  He is having a lot of blood vessels in his eye to burst.  His eye doctor thinks his coumadin need to be adjusted, but pt states he does not want to be in danger of a blood clot.  Please call.  Pt states it is ok to wait for Lauren to return.

## 2015-05-10 NOTE — Telephone Encounter (Signed)
Left message on machine for pt to contact the office. I will attempt to contact the pt again next week.

## 2015-05-13 ENCOUNTER — Ambulatory Visit (INDEPENDENT_AMBULATORY_CARE_PROVIDER_SITE_OTHER): Payer: Medicare Other | Admitting: Neurology

## 2015-05-13 ENCOUNTER — Encounter: Payer: Self-pay | Admitting: Neurology

## 2015-05-13 VITALS — BP 143/74 | HR 58 | Ht 65.5 in | Wt 190.0 lb

## 2015-05-13 DIAGNOSIS — M48061 Spinal stenosis, lumbar region without neurogenic claudication: Secondary | ICD-10-CM

## 2015-05-13 DIAGNOSIS — M4806 Spinal stenosis, lumbar region: Secondary | ICD-10-CM

## 2015-05-13 DIAGNOSIS — G7000 Myasthenia gravis without (acute) exacerbation: Secondary | ICD-10-CM

## 2015-05-13 NOTE — Progress Notes (Signed)
Reason for visit: Myasthenia gravis  [name] is an 75 y.o. male  History of present illness:  [name] is a 75 year old right-handed white male with a history of myasthenia gravis primarily with ocular features. The patient was last seen with significant neck and shoulder discomfort. He was sent for physical therapy which seemed to help. He has had significant resolution of his pain, and he is trying to stretch on a regular basis. He does note some mild gait instability, he does not fall, however. The patient has had some issues with bilateral scleral hemorrhages. He has been on a prednisone Dosepak recently, he now is on 5 mg daily prednisone, and he is on Coumadin. The Coumadin has been in a therapeutic range. He comes back to the office today for an evaluation.  Past Medical History  Diagnosis Date  . Coronary atherosclerosis of unspecified type of vessel, native or graft     a. cath in 2003 showed 10-20% LM, scattered 20% prox-mid LAD, 30% more distal LAD, 50-60% stenosis of LAD towards apex, 20% Cx, 30% dRCA, focal 95% stenosis in smaller of 2 branches of PDA, EF 60-65%.  . Mixed hyperlipidemia   . Glucose intolerance (impaired glucose tolerance)     history of  . Diverticulosis of colon (without mention of hemorrhage)   . Hx of colonic polyps   . Internal hemorrhoids without mention of complication   . Rash and other nonspecific skin eruption   . Conjunctivitis unspecified   . Myasthenia gravis ([place])     uses prednisone  . GERD (gastroesophageal reflux disease)   . Pulmonary embolism [facility]) June 2013    a. Bilateral PE 07/2011.  Marland Kitchen DVT (deep venous thrombosis) [facility]) June 2013  . Chronic anticoagulation   . Diabetes mellitus ([place])   . DJD (degenerative joint disease)   . Ptosis   . 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 Essential hypertension   . IBS (irritable bowel syndrome)   . History of pneumonia   . Dystonia   . 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  PONV (postoperative nausea and vomiting)   . External hemorrhoids without mention of complication   . History of kidney stones   . Tubulovillous adenoma polyp of colon     2010  . HOH (hard of hearing)     hearing aids  . Osteoporosis 2016  . Gastroparesis   . Esophageal stricture   . Chronic edema   . Carotid artery disease ([place])     a. Duplex 05/2014: 123456 RICA, 123456 LICA, elevated velocities in right subclavian artery, normal left subclavian artery..    Past Surgical History  Procedure Laterality Date  . Angioplasty  2001  . Back surgery      x2, lumbar and cervical  . Tonsillectomy    . Cataract extraction Bilateral 12/12  . Shoulder surgery Left 05/07/2009  . Cardiac catheterization    . Lumbar laminectomy/decompression microdiscectomy Right 09/14/2012    Procedure: Right Lumbar three-four, four-five, Lumbar five-Sacral one decompressive laminectomy;  Surgeon: [name], MD;  Location: [facility];  Service: Neurosurgery;  Laterality: Right;  . Esophagogastroduodenoscopy endoscopy  04/2015    Family History  Problem Relation Age of Onset  . Heart disease Mother   . Heart attack Mother   . Stroke Mother   . Heart disease Father   . Heart attack Father   . Stroke Father   . Breast cancer Sister  Twin   . Heart attack Sister   . Dementia Sister   . Heart disease Sister   . Heart attack Sister   . Clotting disorder Sister   . Heart attack Sister   . Colon cancer Neg Hx     Social history:  reports that he has never smoked. He has never used smokeless tobacco. He reports that he does not drink alcohol or use illicit drugs.    Allergies  Allergen Reactions  . Oxycodone-Acetaminophen Other (See Comments)    "too strong"  . Sulfamethoxazole Rash and Itching    Medications:  Prior to Admission medications   Medication Sig Start Date End Date Taking? Authorizing Provider  brimonidine-timolol (COMBIGAN) 0.2-0.5 % ophthalmic solution Place 1 drop into both  eyes every 12 (twelve) hours.   Yes Historical Provider, MD  esomeprazole (NEXIUM) 40 MG capsule Take 1 capsule (40 mg total) by mouth 2 (two) times daily. 04/01/15  Yes Lori P Hvozdovic, PA-C  furosemide (LASIX) 20 MG tablet  03/28/15  Yes Historical Provider, MD  imatinib (GLEEVEC) 400 MG tablet Take 400 mg by mouth daily.    Yes Historical Provider, MD  isosorbide mononitrate (IMDUR) 30 MG 24 hr tablet TAKE 1 TABLET EVERY DAY 09/04/14  Yes Sherren Mocha, MD  loperamide (IMODIUM) 2 MG capsule Take 2 mg by mouth every morning. For diarrhea   Yes Historical Provider, MD  LORazepam (ATIVAN) 0.5 MG tablet Take 0.5 mg by mouth 3 times/day as needed-between meals & bedtime for sleep.   Yes Historical Provider, MD  metFORMIN (GLUCOPHAGE-XR) 500 MG 24 hr tablet  04/22/15  Yes Historical Provider, MD  metoprolol succinate (TOPROL-XL) 25 MG 24 hr tablet TAKE 1 TABLET EVERY DAY 09/04/14  Yes Sherren Mocha, MD  Multiple Vitamin (MULTIVITAMIN) tablet Take 1 tablet by mouth daily.   Yes Historical Provider, MD  nitroGLYCERIN (NITROSTAT) 0.4 MG SL tablet Place 1 tablet (0.4 mg total) under the tongue every 5 (five) minutes x 3 doses as needed for chest pain. 05/22/14  Yes Jerline Pain, MD  predniSONE (DELTASONE) 5 MG tablet Take 1 tablet (5 mg total) by mouth daily. 11/08/14  Yes Kathrynn Ducking, MD  pyridostigmine (MESTINON) 60 MG tablet Take 1 tablet (60 mg total) by mouth 3 (three) times daily. 11/08/14  Yes Kathrynn Ducking, MD  rosuvastatin (CRESTOR) 5 MG tablet Take 1 tablet (5 mg total) by mouth daily. 03/13/15  Yes Sherren Mocha, MD  warfarin (COUMADIN) 5 MG tablet Take 2.5-5 mg by mouth See admin instructions. 5 mg on Wednesday, Saturday and all other days are 2.5 mg   Yes Historical Provider, MD    ROS:  Out of a complete 14 system review of symptoms, the patient complains only of the following symptoms, and all other reviewed systems are negative.  Eye redness Neck pain  Blood pressure 143/74, pulse  58, height 5' 5.5" (1.664 m), weight 190 lb (86.183 kg).  Physical Exam  General: The patient is alert and cooperative at the time of the examination.  Skin: 2 to 3+ edema below the knees is noted bilaterally.   Neurologic Exam  Mental status: The patient is alert and oriented x 3 at the time of the examination. The patient has apparent normal recent and remote memory, with an apparently normal attention span and concentration ability.   Cranial nerves: Facial symmetry is present. Speech is normal, no aphasia or dysarthria is noted. Extraocular movements are full. Visual fields are full.  Motor: The  patient has good strength in all 4 extremities.  Sensory examination: Soft touch sensation is symmetric on the face, arms, and legs.  Coordination: The patient has good finger-nose-finger and heel-to-shin bilaterally.  Gait and station: The patient has a normal gait. Tandem gait is slightly unsteady. Romberg is negative. No drift is seen.  Reflexes: Deep tendon reflexes are symmetric.   Assessment/Plan:  1. Ocular myasthenia gravis  2. Cervical spondylosis  The patient is doing better now with the neck pain. We will follow this conservatively. He will continue his prednisone and Mestinon. I will follow-up in 6 months, sooner if needed.  Jill Alexanders MD 05/13/2015 11:28 AM  [facility] [address] [place] [address] [place], [place] [address]  Phone [phone] Fax [phone]

## 2015-05-13 NOTE — Telephone Encounter (Signed)
The patient's INR is in the low part of therapeutic range. There is nothing we can do in this situation unless his warfarin has to be discontinued. 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

## 2015-05-14 NOTE — Telephone Encounter (Signed)
I spoke with the pt and made him aware that Dr Burt Knack did not feel like warfarin is contributing to blood vessels bursting in eyes. The pt's INR has been sub therapeutic and low normal over the past month.  The pt will follow-up with his opthalmologist.

## 2015-05-21 ENCOUNTER — Ambulatory Visit (INDEPENDENT_AMBULATORY_CARE_PROVIDER_SITE_OTHER): Payer: Medicare Other | Admitting: Pharmacist

## 2015-05-21 DIAGNOSIS — Z86718 Personal history of other venous thrombosis and embolism: Secondary | ICD-10-CM

## 2015-05-21 DIAGNOSIS — Z86711 Personal history of pulmonary embolism: Secondary | ICD-10-CM

## 2015-05-21 DIAGNOSIS — Z5181 Encounter for therapeutic drug level monitoring: Secondary | ICD-10-CM | POA: Diagnosis not present

## 2015-05-21 LAB — POCT INR: INR: 2.5

## 2015-05-22 DIAGNOSIS — H40043 Steroid responder, bilateral: Secondary | ICD-10-CM | POA: Diagnosis not present

## 2015-05-24 ENCOUNTER — Other Ambulatory Visit: Payer: Self-pay | Admitting: Endocrinology

## 2015-05-24 DIAGNOSIS — I6523 Occlusion and stenosis of bilateral carotid arteries: Secondary | ICD-10-CM

## 2015-06-04 ENCOUNTER — Ambulatory Visit (HOSPITAL_COMMUNITY)
Admission: RE | Admit: 2015-06-04 | Discharge: 2015-06-04 | Disposition: A | Discharge status: Home / Self Care | Payer: Medicare Other | Source: Ambulatory Visit | Attending: Cardiovascular Disease | Admitting: Cardiovascular Disease

## 2015-06-04 DIAGNOSIS — E782 Mixed hyperlipidemia: Secondary | ICD-10-CM | POA: Insufficient documentation

## 2015-06-04 DIAGNOSIS — E119 Type 2 diabetes mellitus without complications: Secondary | ICD-10-CM | POA: Diagnosis not present

## 2015-06-04 DIAGNOSIS — I10 Essential (primary) hypertension: Secondary | ICD-10-CM | POA: Diagnosis not present

## 2015-06-04 DIAGNOSIS — K219 Gastro-esophageal reflux disease without esophagitis: Secondary | ICD-10-CM | POA: Diagnosis not present

## 2015-06-04 DIAGNOSIS — I6523 Occlusion and stenosis of bilateral carotid arteries: Secondary | ICD-10-CM | POA: Diagnosis not present

## 2015-06-09 ENCOUNTER — Other Ambulatory Visit: Payer: Self-pay | Admitting: Cardiovascular Disease

## 2015-06-10 DIAGNOSIS — I639 Cerebral infarction, unspecified: Secondary | ICD-10-CM

## 2015-06-10 HISTORY — DX: Cerebral infarction, unspecified: I63.9

## 2015-06-17 DIAGNOSIS — K529 Noninfective gastroenteritis and colitis, unspecified: Secondary | ICD-10-CM | POA: Diagnosis not present

## 2015-06-17 DIAGNOSIS — R197 Diarrhea, unspecified: Secondary | ICD-10-CM | POA: Diagnosis not present

## 2015-06-17 DIAGNOSIS — Z86718 Personal history of other venous thrombosis and embolism: Secondary | ICD-10-CM | POA: Diagnosis not present

## 2015-06-17 DIAGNOSIS — R531 Weakness: Secondary | ICD-10-CM | POA: Diagnosis not present

## 2015-06-17 DIAGNOSIS — E860 Dehydration: Secondary | ICD-10-CM | POA: Diagnosis not present

## 2015-06-17 DIAGNOSIS — Z86711 Personal history of pulmonary embolism: Secondary | ICD-10-CM | POA: Diagnosis not present

## 2015-06-17 DIAGNOSIS — R112 Nausea with vomiting, unspecified: Secondary | ICD-10-CM | POA: Diagnosis not present

## 2015-06-17 DIAGNOSIS — Z7901 Long term (current) use of anticoagulants: Secondary | ICD-10-CM | POA: Diagnosis not present

## 2015-06-17 DIAGNOSIS — Z79899 Other long term (current) drug therapy: Secondary | ICD-10-CM | POA: Diagnosis not present

## 2015-06-17 DIAGNOSIS — R251 Tremor, unspecified: Secondary | ICD-10-CM | POA: Diagnosis not present

## 2015-06-17 DIAGNOSIS — I10 Essential (primary) hypertension: Secondary | ICD-10-CM | POA: Diagnosis not present

## 2015-06-17 DIAGNOSIS — M1990 Unspecified osteoarthritis, unspecified site: Secondary | ICD-10-CM | POA: Diagnosis not present

## 2015-06-17 DIAGNOSIS — E7800 Pure hypercholesterolemia, unspecified: Secondary | ICD-10-CM | POA: Diagnosis not present

## 2015-06-17 DIAGNOSIS — I517 Cardiomegaly: Secondary | ICD-10-CM | POA: Diagnosis not present

## 2015-06-17 DIAGNOSIS — R1084 Generalized abdominal pain: Secondary | ICD-10-CM | POA: Diagnosis not present

## 2015-06-17 DIAGNOSIS — C9210 Chronic myeloid leukemia, BCR/ABL-positive, not having achieved remission: Secondary | ICD-10-CM | POA: Diagnosis not present

## 2015-06-17 DIAGNOSIS — N179 Acute kidney failure, unspecified: Secondary | ICD-10-CM | POA: Diagnosis not present

## 2015-06-17 DIAGNOSIS — Z66 Do not resuscitate: Secondary | ICD-10-CM | POA: Diagnosis not present

## 2015-06-19 ENCOUNTER — Telehealth: Payer: Self-pay | Admitting: Endocrinology

## 2015-06-19 ENCOUNTER — Inpatient Hospital Stay (HOSPITAL_COMMUNITY)
Admission: EM | Admit: 2015-06-19 | Discharge: 2015-06-24 | DRG: 065 | Disposition: A | Discharge status: Rehabilitation Facility | Payer: Medicare Other | Attending: Internal Medicine | Admitting: Internal Medicine

## 2015-06-19 ENCOUNTER — Emergency Department (HOSPITAL_COMMUNITY): Payer: Medicare Other

## 2015-06-19 ENCOUNTER — Encounter (HOSPITAL_COMMUNITY): Payer: Self-pay | Admitting: Family Medicine

## 2015-06-19 DIAGNOSIS — C9210 Chronic myeloid leukemia, BCR/ABL-positive, not having achieved remission: Secondary | ICD-10-CM | POA: Diagnosis present

## 2015-06-19 DIAGNOSIS — I639 Cerebral infarction, unspecified: Secondary | ICD-10-CM

## 2015-06-19 DIAGNOSIS — Z8249 Family history of ischemic heart disease and other diseases of the circulatory system: Secondary | ICD-10-CM

## 2015-06-19 DIAGNOSIS — Z7901 Long term (current) use of anticoagulants: Secondary | ICD-10-CM

## 2015-06-19 DIAGNOSIS — R42 Dizziness and giddiness: Secondary | ICD-10-CM | POA: Diagnosis not present

## 2015-06-19 DIAGNOSIS — I63012 Cerebral infarction due to thrombosis of left vertebral artery: Secondary | ICD-10-CM | POA: Insufficient documentation

## 2015-06-19 DIAGNOSIS — K589 Irritable bowel syndrome without diarrhea: Secondary | ICD-10-CM | POA: Diagnosis present

## 2015-06-19 DIAGNOSIS — I5032 Chronic diastolic (congestive) heart failure: Secondary | ICD-10-CM | POA: Diagnosis not present

## 2015-06-19 DIAGNOSIS — G7000 Myasthenia gravis without (acute) exacerbation: Secondary | ICD-10-CM | POA: Diagnosis present

## 2015-06-19 DIAGNOSIS — Z86711 Personal history of pulmonary embolism: Secondary | ICD-10-CM | POA: Insufficient documentation

## 2015-06-19 DIAGNOSIS — E1122 Type 2 diabetes mellitus with diabetic chronic kidney disease: Secondary | ICD-10-CM | POA: Diagnosis present

## 2015-06-19 DIAGNOSIS — Z7952 Long term (current) use of systemic steroids: Secondary | ICD-10-CM

## 2015-06-19 DIAGNOSIS — R7302 Impaired glucose tolerance (oral): Secondary | ICD-10-CM | POA: Diagnosis present

## 2015-06-19 DIAGNOSIS — I63442 Cerebral infarction due to embolism of left cerebellar artery: Secondary | ICD-10-CM | POA: Diagnosis not present

## 2015-06-19 DIAGNOSIS — M810 Age-related osteoporosis without current pathological fracture: Secondary | ICD-10-CM | POA: Diagnosis present

## 2015-06-19 DIAGNOSIS — Z885 Allergy status to narcotic agent status: Secondary | ICD-10-CM

## 2015-06-19 DIAGNOSIS — I130 Hypertensive heart and chronic kidney disease with heart failure and stage 1 through stage 4 chronic kidney disease, or unspecified chronic kidney disease: Secondary | ICD-10-CM | POA: Diagnosis present

## 2015-06-19 DIAGNOSIS — I2510 Atherosclerotic heart disease of native coronary artery without angina pectoris: Secondary | ICD-10-CM | POA: Diagnosis present

## 2015-06-19 DIAGNOSIS — E119 Type 2 diabetes mellitus without complications: Secondary | ICD-10-CM

## 2015-06-19 DIAGNOSIS — E1143 Type 2 diabetes mellitus with diabetic autonomic (poly)neuropathy: Secondary | ICD-10-CM | POA: Diagnosis not present

## 2015-06-19 DIAGNOSIS — Z888 Allergy status to other drugs, medicaments and biological substances status: Secondary | ICD-10-CM

## 2015-06-19 DIAGNOSIS — Z86718 Personal history of other venous thrombosis and embolism: Secondary | ICD-10-CM

## 2015-06-19 DIAGNOSIS — R270 Ataxia, unspecified: Secondary | ICD-10-CM | POA: Diagnosis present

## 2015-06-19 DIAGNOSIS — I63542 Cerebral infarction due to unspecified occlusion or stenosis of left cerebellar artery: Secondary | ICD-10-CM | POA: Diagnosis present

## 2015-06-19 DIAGNOSIS — H9190 Unspecified hearing loss, unspecified ear: Secondary | ICD-10-CM | POA: Diagnosis present

## 2015-06-19 DIAGNOSIS — E785 Hyperlipidemia, unspecified: Secondary | ICD-10-CM | POA: Insufficient documentation

## 2015-06-19 DIAGNOSIS — E782 Mixed hyperlipidemia: Secondary | ICD-10-CM | POA: Diagnosis present

## 2015-06-19 DIAGNOSIS — R29700 NIHSS score 0: Secondary | ICD-10-CM | POA: Diagnosis present

## 2015-06-19 DIAGNOSIS — N189 Chronic kidney disease, unspecified: Secondary | ICD-10-CM | POA: Diagnosis present

## 2015-06-19 DIAGNOSIS — K219 Gastro-esophageal reflux disease without esophagitis: Secondary | ICD-10-CM | POA: Diagnosis present

## 2015-06-19 LAB — CBC
HEMATOCRIT: 34.7 % — AB (ref 39.0–52.0)
HEMOGLOBIN: 11.2 g/dL — AB (ref 13.0–17.0)
MCH: 30.6 pg (ref 26.0–34.0)
MCHC: 32.3 g/dL (ref 30.0–36.0)
MCV: 94.8 fL (ref 78.0–100.0)
Platelets: 159 10*3/uL (ref 150–400)
RBC: 3.66 MIL/uL — ABNORMAL LOW (ref 4.22–5.81)
RDW: 15.7 % — ABNORMAL HIGH (ref 11.5–15.5)
WBC: 9.7 10*3/uL (ref 4.0–10.5)

## 2015-06-19 LAB — BASIC METABOLIC PANEL
ANION GAP: 7 (ref 5–15)
BUN: 6 mg/dL (ref 6–20)
CHLORIDE: 109 mmol/L (ref 101–111)
CO2: 23 mmol/L (ref 22–32)
Calcium: 8.5 mg/dL — ABNORMAL LOW (ref 8.9–10.3)
Creatinine, Ser: 1.14 mg/dL (ref 0.61–1.24)
GFR calc Af Amer: >60 mL/min (ref >60)
GLUCOSE: 117 mg/dL — AB (ref 65–99)
POTASSIUM: 3.6 mmol/L (ref 3.5–5.1)
Sodium: 139 mmol/L (ref 135–145)

## 2015-06-19 LAB — URINALYSIS, ROUTINE W REFLEX MICROSCOPIC
Bilirubin Urine: NEGATIVE
GLUCOSE, UA: NEGATIVE mg/dL
HGB URINE DIPSTICK: NEGATIVE
KETONES UR: 15 mg/dL — AB
LEUKOCYTES UA: NEGATIVE
NITRITE: NEGATIVE
PH: 5.5 (ref 5.0–8.0)
PROTEIN: NEGATIVE mg/dL
SPECIFIC GRAVITY, URINE: 1.014 (ref 1.005–1.030)

## 2015-06-19 LAB — I-STAT TROPONIN, ED: TROPONIN I, POC: 0.04 ng/mL (ref 0.00–0.08)

## 2015-06-19 LAB — PROTIME-INR
INR: 2.03 — AB (ref 0.00–1.49)
Prothrombin Time: 22.9 seconds — ABNORMAL HIGH (ref 11.6–15.2)

## 2015-06-19 MED ORDER — LORAZEPAM 2 MG/ML IJ SOLN
1.0000 mg | Freq: Once | INTRAMUSCULAR | Status: AC
  Administered 2015-06-20: 1 mg via INTRAVENOUS
  Filled 2015-06-19: qty 1

## 2015-06-19 NOTE — ED Provider Notes (Signed)
CSN: [id number]     Arrival date & time 06/19/15  1710 History  By signing my name below, I, Eustaquio Maize, attest that this documentation has been prepared under the direction and in the presence of Everlene Balls, MD. Electronically Signed: Eustaquio Maize, ED Scribe. 06/19/2015. 11:36 PM.   Chief Complaint  Patient presents with  . Dizziness  . Headache   The history is provided by the patient. No language interpreter was used.     HPI Comments: BESSIE VALDIVIA is a 75 y.o. male with PMHx HTN, HLD, CAD, and Leukemia who presents to the Emergency Department complaining of sudden onset, episodic, room spinning dizziness that began 3 days ago. Pt reports that 3 days ago he had an episode of room spinning dizziness and an inability to walk due to having an unsteady gait. Wife called EMS which took pt to [facility]. Pt was admitted to the hospital, given fluids for the duration of his stay, and discharged earlier today. He reports that he felt mildly better while in the hospital after receiving IV fluids but upon returning home today he had a similar episode of sudden onset room spinning dizziness. He reports that he did not walk very much while in the hospital. Wife states that pt was very diaphoretic with shakiness and the same unsteady gait.  Wife called pt's PCP, Dr. Loanne Drilling, who suggested he return to an ED for further evaluation. Wife states that pt did not have a CT Head or MR Brain done while at [place]. Denies weakness, numbness, tingling, any other associated symptoms. Pt is on Coumadin for previous PE.   Past Medical History  Diagnosis Date  . Coronary atherosclerosis of unspecified type of vessel, native or graft     a. cath in 2003 showed 10-20% LM, scattered 20% prox-mid LAD, 30% more distal LAD, 50-60% stenosis of LAD towards apex, 20% Cx, 30% dRCA, focal 95% stenosis in smaller of 2 branches of PDA, EF 60-65%.  . Mixed hyperlipidemia   . Glucose intolerance (impaired glucose  tolerance)     history of  . Diverticulosis of colon (without mention of hemorrhage)   . Hx of colonic polyps   . Internal hemorrhoids without mention of complication   . Rash and other nonspecific skin eruption   . Conjunctivitis unspecified   . Myasthenia gravis ([place])     uses prednisone  . GERD (gastroesophageal reflux disease)   . Pulmonary embolism [facility]) June 2013    a. Bilateral PE 07/2011.  Marland Kitchen DVT (deep venous thrombosis) [facility]) June 2013  . Chronic anticoagulation   . Diabetes mellitus ([place])   . DJD (degenerative joint disease)   . Ptosis   . 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 Essential hypertension   . IBS (irritable bowel syndrome)   . History of pneumonia   . Dystonia   . 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 PONV (postoperative nausea and vomiting)   . External hemorrhoids without mention of complication   . History of kidney stones   . Tubulovillous adenoma polyp of colon     2010  . HOH (hard of hearing)     hearing aids  . Osteoporosis 2016  . Gastroparesis   . Esophageal stricture   . Chronic edema   . Carotid artery disease ([place])     a. Duplex 05/2014: 123456 RICA, 123456 LICA, elevated velocities in right subclavian artery, normal left subclavian artery..   Past Surgical History  Procedure Laterality Date  .  Angioplasty  2001  . Back surgery      x2, lumbar and cervical  . Tonsillectomy    . Cataract extraction Bilateral 12/12  . Shoulder surgery Left 05/07/2009  . Cardiac catheterization    . Lumbar laminectomy/decompression microdiscectomy Right 09/14/2012    Procedure: Right Lumbar three-four, four-five, Lumbar five-Sacral one decompressive laminectomy;  Surgeon: [name], MD;  Location: [place] [facility];  Service: Neurosurgery;  Laterality: Right;  . Esophagogastroduodenoscopy endoscopy  04/2015   Family History  Problem Relation Age of Onset  . Heart disease Mother   . Heart attack Mother   . Stroke Mother   . Heart disease Father    . Heart attack Father   . Stroke Father   . Breast cancer Sister     Twin   . Heart attack Sister   . Dementia Sister   . Heart disease Sister   . Heart attack Sister   . Clotting disorder Sister   . Heart attack Sister   . Colon cancer Neg Hx    Social History  Substance Use Topics  . Smoking status: Never Smoker   . Smokeless tobacco: Never Used  . Alcohol Use: No    Review of Systems 10 Systems reviewed and all are negative for acute change except as noted in the HPI.   Allergies  Oxycodone-acetaminophen and Sulfamethoxazole  Home Medications   Prior to Admission medications   Medication Sig Start Date End Date Taking? Authorizing Provider  brimonidine-timolol (COMBIGAN) 0.2-0.5 % ophthalmic solution Place 1 drop into both eyes every 12 (twelve) hours.    Historical Provider, MD  esomeprazole (NEXIUM) 40 MG capsule Take 1 capsule (40 mg total) by mouth 2 (two) times daily. 04/01/15   Lori P Hvozdovic, PA-C  furosemide (LASIX) 20 MG tablet  03/28/15   Historical Provider, MD  imatinib (GLEEVEC) 400 MG tablet Take 400 mg by mouth daily.     Historical Provider, MD  isosorbide mononitrate (IMDUR) 30 MG 24 hr tablet TAKE 1 TABLET EVERY DAY 09/04/14   Sherren Mocha, MD  loperamide (IMODIUM) 2 MG capsule Take 2 mg by mouth every morning. For diarrhea    Historical Provider, MD  LORazepam (ATIVAN) 0.5 MG tablet Take 0.5 mg by mouth 3 times/day as needed-between meals & bedtime for sleep.    Historical Provider, MD  metFORMIN (GLUCOPHAGE-XR) 500 MG 24 hr tablet  04/22/15   Historical Provider, MD  metoprolol succinate (TOPROL-XL) 25 MG 24 hr tablet TAKE 1 TABLET EVERY DAY 09/04/14   Sherren Mocha, MD  Multiple Vitamin (MULTIVITAMIN) tablet Take 1 tablet by mouth daily.    Historical Provider, MD  nitroGLYCERIN (NITROSTAT) 0.4 MG SL tablet Place 1 tablet (0.4 mg total) under the tongue every 5 (five) minutes x 3 doses as needed for chest pain. 05/22/14   Jerline Pain, MD  predniSONE  (DELTASONE) 5 MG tablet Take 1 tablet (5 mg total) by mouth daily. 11/08/14   Kathrynn Ducking, MD  pyridostigmine (MESTINON) 60 MG tablet Take 1 tablet (60 mg total) by mouth 3 (three) times daily. 11/08/14   Kathrynn Ducking, MD  rosuvastatin (CRESTOR) 10 MG tablet TAKE 1/2 TABLETS BY MOUTH DAILY. 06/10/15   Sherren Mocha, MD  rosuvastatin (CRESTOR) 5 MG tablet Take 1 tablet (5 mg total) by mouth daily. 03/13/15   Sherren Mocha, MD  warfarin (COUMADIN) 5 MG tablet Take 2.5-5 mg by mouth See admin instructions. 5 mg on Wednesday, Saturday and all other days are  2.5 mg    Historical Provider, MD   BP 176/71 mmHg  Pulse 69  Temp(Src) 99.2 F (37.3 C) (Oral)  Resp 17  SpO2 96%   Physical Exam  Constitutional: He is oriented to person, place, and time. Vital signs are normal. He appears well-developed and well-nourished.  Non-toxic appearance. He does not appear ill. No distress.  HENT:  Head: Normocephalic and atraumatic.  Nose: Nose normal.  Mouth/Throat: Oropharynx is clear and moist. No oropharyngeal exudate.  Eyes: Conjunctivae and EOM are normal. Pupils are equal, round, and reactive to light. No scleral icterus.  Neck: Normal range of motion. Neck supple. No tracheal deviation, no edema, no erythema and normal range of motion present. No thyroid mass and no thyromegaly present.  Cardiovascular: Normal rate, regular rhythm, S1 normal, S2 normal, normal heart sounds, intact distal pulses and normal pulses.  Exam reveals no gallop and no friction rub.   No murmur heard. Pulmonary/Chest: Effort normal and breath sounds normal. No respiratory distress. He has no wheezes. He has no rhonchi. He has no rales.  Abdominal: Soft. Normal appearance and bowel sounds are normal. He exhibits no distension, no ascites and no mass. There is no hepatosplenomegaly. There is no tenderness. There is no rebound, no guarding and no CVA tenderness.  Musculoskeletal: Normal range of motion. He exhibits edema. He  exhibits no tenderness.  BLE edema  Lymphadenopathy:    He has no cervical adenopathy.  Neurological: He is alert and oriented to person, place, and time. He has normal strength. No cranial nerve deficit or sensory deficit.  Normal strength and sensation all extremities.  Normal cerebellar testing.   Skin: Skin is warm, dry and intact. No petechiae and no rash noted. He is not diaphoretic. No erythema. No pallor.  Psychiatric: He has a normal mood and affect. His behavior is normal. Judgment normal.  Nursing note and vitals reviewed.   ED Course  Procedures (including critical care time)  DIAGNOSTIC STUDIES: Oxygen Saturation is 96% on RA, normal by my interpretation.    COORDINATION OF CARE: 11:27 PM-Discussed treatment plan which includes MR Brain with pt at bedside and pt agreed to plan.   Labs Review Labs Reviewed  BASIC METABOLIC PANEL - Abnormal; Notable for the following:    Glucose, Bld 117 (*)    Calcium 8.5 (*)    All other components within normal limits  CBC - Abnormal; Notable for the following:    RBC 3.66 (*)    Hemoglobin 11.2 (*)    HCT 34.7 (*)    RDW 15.7 (*)    All other components within normal limits  URINALYSIS, ROUTINE W REFLEX MICROSCOPIC (NOT AT [facility]) - Abnormal; Notable for the following:    Ketones, ur 15 (*)    All other components within normal limits  PROTIME-INR - Abnormal; Notable for the following:    Prothrombin Time 22.9 (*)    INR 2.03 (*)    All other components within normal limits  TSH  T4, FREE  I-STAT TROPOININ, ED  CBG MONITORING, ED    Imaging Review Dg Chest 2 View  06/19/2015  CLINICAL DATA:  Dizziness and weakness for 1 day. Near syncopal episode. EXAM: CHEST  2 VIEW COMPARISON:  06/17/2015 FINDINGS: Mild cardiomegaly remains stable. Tiny pleural effusions are seen bilaterally. No evidence of pulmonary edema or consolidation. Tortuosity of thoracic aorta remains stable. IMPRESSION: Tiny bilateral pleural effusions. No  evidence of pulmonary edema or consolidation. Stable mild cardiomegaly. Electronically Signed  By: Earle Gell M.D.   On: 06/19/2015 18:31   Mr Jeri Cos [id number] Contrast  06/20/2015  CLINICAL DATA:  Acute onset dizziness for 3 days, unsteady gait. Symptoms improved with IV fluids. On Coumadin for pulmonary embolism. History of leukemia, hypertension, diabetes. EXAM: MRI HEAD WITHOUT AND WITH CONTRAST TECHNIQUE: Multiplanar, multiecho pulse sequences of the brain and surrounding structures were obtained without and with intravenous contrast. CONTRAST:  71mL MULTIHANCE GADOBENATE DIMEGLUMINE 529 MG/ML IV SOLN COMPARISON:  None. FINDINGS: INTRACRANIAL CONTENTS: Wedge-like 32 x 38 mm reduced diffusion LEFT inferior cerebellum with intermediate to low ADC values. Subcentimeter linear reduced diffusion inferior vermis. Associated FLAIR T2 hyperintense signal. No susceptibility artifact to suggest hemorrhage. Ventricles and sulci are normal for patient's age. Patchy to confluent supratentorial white matter FLAIR T2 hyperintensities. No midline shift, mass effect, masses nor abnormal intracranial enhancement. Postcontrast sequences are moderately motion degraded without convincing evidence of abnormal intracranial enhancement. No abnormal extra-axial fluid collections. Loss of LEFT vertebral artery flow void and occluded LEFT V4 segment. ORBITS: The included ocular globes and orbital contents are non-suspicious. Status post bilateral ocular lens implants. SINUSES: Severe chronic RIGHT maxillary sinusitis without paranasal sinus air-fluid levels. Trace bilateral mastoid effusions. SKULL/SOFT TISSUES: No abnormal sellar expansion. No suspicious calvarial bone marrow signal. Craniocervical junction maintained. 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 Moderate chronic small vessel ischemic disease. No  convincing evidence of abnormal intracranial enhancement on moderately motion degraded post gadolinium sequences. Acute findings discussed with and reconfirmed by Dr.Nashla Althoff on 06/20/2015 at 5:16 am. Electronically Signed   By: Elon Alas M.D.   On: 06/20/2015 05:17   I have personally reviewed and evaluated these images and lab results as part of my medical decision-making.   EKG Interpretation   Date/Time:  Wednesday Jun 19 2015 17:40:56 EDT Ventricular Rate:  69 PR Interval:  172 QRS Duration: 66 QT Interval:  400 QTC Calculation: 428 R Axis:   27 Text Interpretation:  Sinus rhythm Low voltage QRS Nonspecific ST  abnormality Abnormal ECG Artifact When compared with ECG of 03/07/2015, No  significant change was found Confirmed by [facility]  MD, DAVID ([id number]) on  06/19/2015 5:49:07 PM      MDM   Final diagnoses:  None     Patient presents to the ED for persistent dizziness and abnormal gait for the past couple of days.  I have concern for possible posterior stroke. In addition, he has a ho leukemia and may have mets.  Will obtain MRI for further evaluation. Labs and infectious work up is negative.   2:12 AM I was just informed patient does not have an IV.  I did not know this prior.  IV was placed under US guidance. Patient still awaiting MRI with contrast.  5:26 AM MRI complete and reveals an acute posterior circulation stroke.  Will call [facility] hospitalist for admission.   Angiocath insertion Performed by: Everlene Balls  Consent: Verbal consent obtained. Risks and benefits: risks, benefits and alternatives were discussed Time out: Immediately prior to procedure a "time out" was called to verify the correct patient, procedure, equipment, support staff and site/side marked as required.  Preparation: Patient was prepped and draped in the usual sterile fashion.  Vein Location: R AC  Ultrasound Guided  Gauge: 20G  Normal blood return and flush without  difficulty Patient tolerance: Patient tolerated the procedure well with no immediate complications.  I personally performed the services described in this documentation, which was scribed in my presence. The recorded information has been reviewed and is accurate.       Everlene Balls, MD 06/20/15 [phone]

## 2015-06-19 NOTE — ED Notes (Signed)
Pt here for dizziness, headache, lethargy. Released from [place] today and feeling worse. sts he feels disoriented.

## 2015-06-19 NOTE — Telephone Encounter (Signed)
Pt is very sick and just left  hospital should he go to [facility]?  He was treated for dehydration, he is sweating profusely again and lethargic, has been going on for four days

## 2015-06-19 NOTE — Telephone Encounter (Signed)
Go to ER now

## 2015-06-19 NOTE — Telephone Encounter (Signed)
I contacted the pt's wife and advised of note below. She voiced understanding and stated she would take the pt to ED.

## 2015-06-19 NOTE — Telephone Encounter (Signed)
See note below and please advise, Thanks! 

## 2015-06-20 ENCOUNTER — Inpatient Hospital Stay (HOSPITAL_COMMUNITY): Payer: Medicare Other

## 2015-06-20 ENCOUNTER — Emergency Department (HOSPITAL_COMMUNITY): Payer: Medicare Other

## 2015-06-20 ENCOUNTER — Encounter (HOSPITAL_COMMUNITY): Payer: Self-pay

## 2015-06-20 DIAGNOSIS — R270 Ataxia, unspecified: Secondary | ICD-10-CM | POA: Diagnosis present

## 2015-06-20 DIAGNOSIS — Z86711 Personal history of pulmonary embolism: Secondary | ICD-10-CM | POA: Insufficient documentation

## 2015-06-20 DIAGNOSIS — I130 Hypertensive heart and chronic kidney disease with heart failure and stage 1 through stage 4 chronic kidney disease, or unspecified chronic kidney disease: Secondary | ICD-10-CM | POA: Diagnosis present

## 2015-06-20 DIAGNOSIS — M810 Age-related osteoporosis without current pathological fracture: Secondary | ICD-10-CM | POA: Diagnosis present

## 2015-06-20 DIAGNOSIS — E119 Type 2 diabetes mellitus without complications: Secondary | ICD-10-CM

## 2015-06-20 DIAGNOSIS — M7661 Achilles tendinitis, right leg: Secondary | ICD-10-CM | POA: Diagnosis not present

## 2015-06-20 DIAGNOSIS — Z7901 Long term (current) use of anticoagulants: Secondary | ICD-10-CM

## 2015-06-20 DIAGNOSIS — K589 Irritable bowel syndrome without diarrhea: Secondary | ICD-10-CM | POA: Diagnosis present

## 2015-06-20 DIAGNOSIS — N189 Chronic kidney disease, unspecified: Secondary | ICD-10-CM | POA: Diagnosis present

## 2015-06-20 DIAGNOSIS — I6789 Other cerebrovascular disease: Secondary | ICD-10-CM

## 2015-06-20 DIAGNOSIS — I63542 Cerebral infarction due to unspecified occlusion or stenosis of left cerebellar artery: Secondary | ICD-10-CM | POA: Diagnosis present

## 2015-06-20 DIAGNOSIS — I639 Cerebral infarction, unspecified: Secondary | ICD-10-CM

## 2015-06-20 DIAGNOSIS — Z885 Allergy status to narcotic agent status: Secondary | ICD-10-CM | POA: Diagnosis not present

## 2015-06-20 DIAGNOSIS — E785 Hyperlipidemia, unspecified: Secondary | ICD-10-CM

## 2015-06-20 DIAGNOSIS — Z888 Allergy status to other drugs, medicaments and biological substances status: Secondary | ICD-10-CM | POA: Diagnosis not present

## 2015-06-20 DIAGNOSIS — I5032 Chronic diastolic (congestive) heart failure: Secondary | ICD-10-CM | POA: Diagnosis not present

## 2015-06-20 DIAGNOSIS — I69398 Other sequelae of cerebral infarction: Secondary | ICD-10-CM | POA: Diagnosis not present

## 2015-06-20 DIAGNOSIS — R7302 Impaired glucose tolerance (oral): Secondary | ICD-10-CM | POA: Diagnosis present

## 2015-06-20 DIAGNOSIS — C9210 Chronic myeloid leukemia, BCR/ABL-positive, not having achieved remission: Secondary | ICD-10-CM | POA: Diagnosis present

## 2015-06-20 DIAGNOSIS — I10 Essential (primary) hypertension: Secondary | ICD-10-CM | POA: Diagnosis not present

## 2015-06-20 DIAGNOSIS — K219 Gastro-esophageal reflux disease without esophagitis: Secondary | ICD-10-CM | POA: Diagnosis present

## 2015-06-20 DIAGNOSIS — Z8249 Family history of ischemic heart disease and other diseases of the circulatory system: Secondary | ICD-10-CM | POA: Diagnosis not present

## 2015-06-20 DIAGNOSIS — Z7952 Long term (current) use of systemic steroids: Secondary | ICD-10-CM | POA: Diagnosis not present

## 2015-06-20 DIAGNOSIS — I2510 Atherosclerotic heart disease of native coronary artery without angina pectoris: Secondary | ICD-10-CM | POA: Diagnosis not present

## 2015-06-20 DIAGNOSIS — H9190 Unspecified hearing loss, unspecified ear: Secondary | ICD-10-CM | POA: Diagnosis present

## 2015-06-20 DIAGNOSIS — E1143 Type 2 diabetes mellitus with diabetic autonomic (poly)neuropathy: Secondary | ICD-10-CM | POA: Diagnosis present

## 2015-06-20 DIAGNOSIS — I63119 Cerebral infarction due to embolism of unspecified vertebral artery: Secondary | ICD-10-CM | POA: Diagnosis not present

## 2015-06-20 DIAGNOSIS — G7000 Myasthenia gravis without (acute) exacerbation: Secondary | ICD-10-CM | POA: Diagnosis not present

## 2015-06-20 DIAGNOSIS — R269 Unspecified abnormalities of gait and mobility: Secondary | ICD-10-CM | POA: Diagnosis not present

## 2015-06-20 DIAGNOSIS — I63012 Cerebral infarction due to thrombosis of left vertebral artery: Secondary | ICD-10-CM | POA: Insufficient documentation

## 2015-06-20 DIAGNOSIS — R29700 NIHSS score 0: Secondary | ICD-10-CM | POA: Diagnosis present

## 2015-06-20 DIAGNOSIS — M47812 Spondylosis without myelopathy or radiculopathy, cervical region: Secondary | ICD-10-CM | POA: Diagnosis not present

## 2015-06-20 DIAGNOSIS — E782 Mixed hyperlipidemia: Secondary | ICD-10-CM | POA: Diagnosis present

## 2015-06-20 DIAGNOSIS — E1122 Type 2 diabetes mellitus with diabetic chronic kidney disease: Secondary | ICD-10-CM | POA: Diagnosis present

## 2015-06-20 DIAGNOSIS — R29898 Other symptoms and signs involving the musculoskeletal system: Secondary | ICD-10-CM | POA: Diagnosis not present

## 2015-06-20 DIAGNOSIS — I63442 Cerebral infarction due to embolism of left cerebellar artery: Secondary | ICD-10-CM | POA: Diagnosis present

## 2015-06-20 DIAGNOSIS — R42 Dizziness and giddiness: Secondary | ICD-10-CM | POA: Diagnosis not present

## 2015-06-20 DIAGNOSIS — I63112 Cerebral infarction due to embolism of left vertebral artery: Secondary | ICD-10-CM | POA: Diagnosis not present

## 2015-06-20 DIAGNOSIS — Z86718 Personal history of other venous thrombosis and embolism: Secondary | ICD-10-CM | POA: Diagnosis not present

## 2015-06-20 DIAGNOSIS — I69393 Ataxia following cerebral infarction: Secondary | ICD-10-CM | POA: Diagnosis not present

## 2015-06-20 LAB — ECHOCARDIOGRAM COMPLETE

## 2015-06-20 LAB — T4, FREE: Free T4: 1.05 ng/dL (ref 0.61–1.12)

## 2015-06-20 LAB — TSH: TSH: 1.589 u[IU]/mL (ref 0.350–4.500)

## 2015-06-20 MED ORDER — ADULT MULTIVITAMIN W/MINERALS CH
1.0000 | ORAL_TABLET | Freq: Every day | ORAL | Status: DC
  Administered 2015-06-21 – 2015-06-24 (×4): 1 via ORAL
  Filled 2015-06-20 (×4): qty 1

## 2015-06-20 MED ORDER — ISOSORBIDE MONONITRATE ER 30 MG PO TB24
30.0000 mg | ORAL_TABLET | Freq: Every day | ORAL | Status: DC
  Administered 2015-06-20 – 2015-06-24 (×5): 30 mg via ORAL
  Filled 2015-06-20 (×5): qty 1

## 2015-06-20 MED ORDER — ROSUVASTATIN CALCIUM 5 MG PO TABS
5.0000 mg | ORAL_TABLET | Freq: Every day | ORAL | Status: DC
  Administered 2015-06-20 – 2015-06-24 (×5): 5 mg via ORAL
  Filled 2015-06-20 (×6): qty 1

## 2015-06-20 MED ORDER — LOPERAMIDE HCL 2 MG PO CAPS
2.0000 mg | ORAL_CAPSULE | ORAL | Status: DC
  Administered 2015-06-21 – 2015-06-24 (×4): 2 mg via ORAL
  Filled 2015-06-20 (×3): qty 1

## 2015-06-20 MED ORDER — IMATINIB MESYLATE 100 MG PO TABS
400.0000 mg | ORAL_TABLET | Freq: Every day | ORAL | Status: DC
  Administered 2015-06-21 – 2015-06-23 (×2): 400 mg via ORAL
  Filled 2015-06-20 (×4): qty 4

## 2015-06-20 MED ORDER — WARFARIN - PHARMACIST DOSING INPATIENT: Freq: Every day | Status: DC

## 2015-06-20 MED ORDER — LORAZEPAM 0.5 MG PO TABS
0.5000 mg | ORAL_TABLET | Freq: Two times a day (BID) | ORAL | Status: DC | PRN
  Administered 2015-06-22 – 2015-06-23 (×3): 0.5 mg via ORAL
  Filled 2015-06-20 (×4): qty 1

## 2015-06-20 MED ORDER — BRIMONIDINE TARTRATE 0.2 % OP SOLN
1.0000 [drp] | Freq: Two times a day (BID) | OPHTHALMIC | Status: DC
  Administered 2015-06-20 – 2015-06-24 (×8): 1 [drp] via OPHTHALMIC
  Filled 2015-06-20: qty 5

## 2015-06-20 MED ORDER — PANTOPRAZOLE SODIUM 40 MG PO TBEC
40.0000 mg | DELAYED_RELEASE_TABLET | Freq: Every day | ORAL | Status: DC
  Administered 2015-06-21 – 2015-06-24 (×4): 40 mg via ORAL
  Filled 2015-06-20 (×4): qty 1

## 2015-06-20 MED ORDER — METOPROLOL SUCCINATE ER 25 MG PO TB24
25.0000 mg | ORAL_TABLET | Freq: Every day | ORAL | Status: DC
  Administered 2015-06-20 – 2015-06-24 (×5): 25 mg via ORAL
  Filled 2015-06-20 (×5): qty 1

## 2015-06-20 MED ORDER — GADOBENATE DIMEGLUMINE 529 MG/ML IV SOLN
20.0000 mL | Freq: Once | INTRAVENOUS | Status: AC | PRN
  Administered 2015-06-20: 18 mL via INTRAVENOUS

## 2015-06-20 MED ORDER — NITROGLYCERIN 0.4 MG SL SUBL: 0.4000 mg | SUBLINGUAL_TABLET | SUBLINGUAL | Status: DC | PRN

## 2015-06-20 MED ORDER — IOPAMIDOL (ISOVUE-370) INJECTION 76%
INTRAVENOUS | Status: AC
  Administered 2015-06-20: 50 mL
  Filled 2015-06-20: qty 50

## 2015-06-20 MED ORDER — PYRIDOSTIGMINE BROMIDE 60 MG PO TABS
60.0000 mg | ORAL_TABLET | Freq: Three times a day (TID) | ORAL | Status: DC
  Administered 2015-06-20 – 2015-06-24 (×10): 60 mg via ORAL
  Filled 2015-06-20 (×13): qty 1

## 2015-06-20 MED ORDER — APIXABAN 5 MG PO TABS: 5.0000 mg | ORAL_TABLET | Freq: Two times a day (BID) | ORAL | Status: DC

## 2015-06-20 MED ORDER — LORAZEPAM 2 MG/ML IJ SOLN
1.0000 mg | Freq: Once | INTRAMUSCULAR | Status: AC
  Administered 2015-06-20: 1 mg via INTRAVENOUS
  Filled 2015-06-20: qty 1

## 2015-06-20 MED ORDER — PREDNISONE 5 MG PO TABS
5.0000 mg | ORAL_TABLET | Freq: Every day | ORAL | Status: DC
  Administered 2015-06-21 – 2015-06-24 (×4): 5 mg via ORAL
  Filled 2015-06-20 (×4): qty 1

## 2015-06-20 MED ORDER — WARFARIN SODIUM 5 MG PO TABS: 5.0000 mg | ORAL_TABLET | Freq: Once | ORAL | Status: DC

## 2015-06-20 MED ORDER — BRIMONIDINE TARTRATE-TIMOLOL 0.2-0.5 % OP SOLN: 1.0000 [drp] | Freq: Two times a day (BID) | OPHTHALMIC | Status: DC

## 2015-06-20 MED ORDER — FUROSEMIDE 20 MG PO TABS
20.0000 mg | ORAL_TABLET | Freq: Every day | ORAL | Status: DC
  Administered 2015-06-21 – 2015-06-24 (×4): 20 mg via ORAL
  Filled 2015-06-20 (×4): qty 1

## 2015-06-20 MED ORDER — TIMOLOL MALEATE 0.5 % OP SOLN
1.0000 [drp] | Freq: Two times a day (BID) | OPHTHALMIC | Status: DC
  Administered 2015-06-20 – 2015-06-24 (×8): 1 [drp] via OPHTHALMIC
  Filled 2015-06-20: qty 5

## 2015-06-20 NOTE — Consult Note (Signed)
Admission H&P    Chief Complaint: Vertigo and unstable gait for 3 days.  HPI: [name] is an 75 y.o. male history diabetes mellitus, hypertension, hyperlipidemia, myasthenia gravis, DVT and pulmonary embolus on Coumadin, presenting following 3 days of vertigo with associated nausea as well as unstable gait. Symptoms were of sudden onset at 10:30 AM on 02/17/2015. He had no previous history of stroke nor TIA. He has not experienced diplopia. His been no change in speech or swallowing. He also has not experienced focal weakness of extremities. MRI of his brain showed acute stroke involving left inferior cerebellum, PICA territory. NIH stroke score at the time of this evaluation was 0.  LSN: 10:30 AM on 06/17/2015 tPA Given: No: Beyond time window for treatment consideration mRankin:  Past Medical History  Diagnosis Date  . Coronary atherosclerosis of unspecified type of vessel, native or graft     a. cath in 2003 showed 10-20% LM, scattered 20% prox-mid LAD, 30% more distal LAD, 50-60% stenosis of LAD towards apex, 20% Cx, 30% dRCA, focal 95% stenosis in smaller of 2 branches of PDA, EF 60-65%.  . Mixed hyperlipidemia   . Glucose intolerance (impaired glucose tolerance)     history of  . Diverticulosis of colon (without mention of hemorrhage)   . Hx of colonic polyps   . Internal hemorrhoids without mention of complication   . Rash and other nonspecific skin eruption   . Conjunctivitis unspecified   . Myasthenia gravis ([place])     uses prednisone  . GERD (gastroesophageal reflux disease)   . Pulmonary embolism [facility]) June 2013    a. Bilateral PE 07/2011.  Marland Kitchen DVT (deep venous thrombosis) [facility]) June 2013  . Chronic anticoagulation   . Diabetes mellitus ([place])   . DJD (degenerative joint disease)   . Ptosis   . 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 Essential hypertension   . IBS (irritable bowel syndrome)   . History of pneumonia   . Dystonia   . Spinal stenosis of lumbar region  06/01/2012  . PONV (postoperative nausea and vomiting)   . External hemorrhoids without mention of complication   . History of kidney stones   . Tubulovillous adenoma polyp of colon     2010  . HOH (hard of hearing)     hearing aids  . Osteoporosis 2016  . Gastroparesis   . Esophageal stricture   . Chronic edema   . Carotid artery disease ([place])     a. Duplex 05/2014: 36-14% RICA, 4-31% LICA, elevated velocities in right subclavian artery, normal left subclavian artery..    Past Surgical History  Procedure Laterality Date  . Angioplasty  2001  . Back surgery      x2, lumbar and cervical  . Tonsillectomy    . Cataract extraction Bilateral 12/12  . Shoulder surgery Left 05/07/2009  . Cardiac catheterization    . Lumbar laminectomy/decompression microdiscectomy Right 09/14/2012    Procedure: Right Lumbar three-four, four-five, Lumbar five-Sacral one decompressive laminectomy;  Surgeon: [name], MD;  Location: [place] [facility];  Service: Neurosurgery;  Laterality: Right;  . Esophagogastroduodenoscopy endoscopy  04/2015    Family History  Problem Relation Age of Onset  . Heart disease Mother   . Heart attack Mother   . Stroke Mother   . Heart disease Father   . Heart attack Father   . Stroke Father   . Breast cancer Sister     Twin   . Heart attack Sister   .  Dementia Sister   . Heart disease Sister   . Heart attack Sister   . Clotting disorder Sister   . Heart attack Sister   . Colon cancer Neg Hx    Social History:  reports that he has never smoked. He has never used smokeless tobacco. He reports that he does not drink alcohol or use illicit drugs.  Allergies:  Allergies  Allergen Reactions  . Oxycodone-Acetaminophen Other (See Comments)    "too strong"  . Phenergan [Promethazine Hcl]     Eye swelling  . Sulfamethoxazole Rash and Itching    Medications: Preadmission medications were reviewed by me.  ROS: History obtained from spouse and the patient  General  ROS: negative for - chills, fatigue, fever, night sweats, weight gain or weight loss Psychological ROS: negative for - behavioral disorder, hallucinations, memory difficulties, mood swings or suicidal ideation Ophthalmic ROS: negative for - blurry vision, double vision, eye pain or loss of vision ENT ROS: negative for - epistaxis, nasal discharge, oral lesions, sore throat, tinnitus or vertigo Allergy and Immunology ROS: negative for - hives or itchy/watery eyes Hematological and Lymphatic ROS: negative for - bleeding problems, bruising or swollen lymph nodes Endocrine ROS: negative for - galactorrhea, hair pattern changes, polydipsia/polyuria or temperature intolerance Respiratory ROS: negative for - cough, hemoptysis, shortness of breath or wheezing Cardiovascular ROS: negative for - chest pain, dyspnea on exertion, edema or irregular heartbeat Gastrointestinal ROS: negative for - abdominal pain, diarrhea, hematemesis, nausea/vomiting or stool incontinence Genito-Urinary ROS: negative for - dysuria, hematuria, incontinence or urinary frequency/urgency Musculoskeletal ROS: negative for - joint swelling or muscular weakness Neurological ROS: as noted in HPI Dermatological ROS: negative for rash and skin lesion changes  Physical Examination: Blood pressure 136/80, pulse 72, temperature 99.2 F (37.3 C), temperature source Oral, resp. rate 18, SpO2 96 %.  HEENT-  Normocephalic, no lesions, without obvious abnormality.  Normal external eye and conjunctiva.  Normal TM's bilaterally.  Normal auditory canals and external ears. Normal external nose, mucus membranes and septum.  Normal pharynx. Neck supple with no masses, nodes, nodules or enlargement. Cardiovascular - regular rate and rhythm, S1, S2 normal, no murmur, click, rub or gallop Lungs - chest clear, no wheezing, rales, normal symmetric air entry Abdomen - soft, non-tender; bowel sounds normal; no masses,  no organomegaly Extremities - no  joint deformities, effusion, or inflammation and no edema  Neurologic Examination: Mental Status: Alert, oriented, no acute distress.  Speech fluent without evidence of aphasia. Able to follow commands without difficulty. Cranial Nerves: II-Visual fields were normal. III/IV/VI-Pupils were equal and reacted normally to light. Extraocular movements were full and conjugate. No ptosis of eyelids noted. V/VII-no facial numbness and no facial weakness. VIII-normal. X-normal speech. XI: trapezius strength/neck flexion strength normal bilaterally XII-midline tongue extension with normal strength. Motor: 5/5 bilaterally with normal tone and bulk Sensory: Normal throughout. Deep Tendon Reflexes: 1+ and symmetric. Plantars: Flexor bilaterally Cerebellar: Normal finger-to-nose testing. Carotid auscultation: Normal  Results for orders placed or performed during the hospital encounter of 06/19/15 (from the past 48 hour(s))  Basic metabolic panel     Status: Abnormal   Collection Time: 06/19/15  5:50 PM  Result Value Ref Range   Sodium 139 135 - 145 mmol/L   Potassium 3.6 3.5 - 5.1 mmol/L   Chloride 109 101 - 111 mmol/L   CO2 23 22 - 32 mmol/L   Glucose, Bld 117 (H) 65 - 99 mg/dL   BUN 6 6 - 20 mg/dL  Creatinine, Ser 1.14 0.61 - 1.24 mg/dL   Calcium 8.5 (L) 8.9 - 10.3 mg/dL   GFR calc non Af Amer >60 >60 mL/min   GFR calc Af Amer >60 >60 mL/min    Comment: (NOTE) The eGFR has been calculated using the CKD EPI equation. This calculation has not been validated in all clinical situations. eGFR's persistently <60 mL/min signify possible Chronic Kidney Disease.    Anion gap 7 5 - 15  CBC     Status: Abnormal   Collection Time: 06/19/15  5:50 PM  Result Value Ref Range   WBC 9.7 4.0 - 10.5 K/uL   RBC 3.66 (L) 4.22 - 5.81 MIL/uL   Hemoglobin 11.2 (L) 13.0 - 17.0 g/dL   HCT 34.7 (L) 39.0 - 52.0 %   MCV 94.8 78.0 - 100.0 fL   MCH 30.6 26.0 - 34.0 pg   MCHC 32.3 30.0 - 36.0 g/dL   RDW 15.7  (H) 11.5 - 15.5 %   Platelets 159 150 - 400 K/uL  Protime-INR (order if Patient is taking Coumadin / Warfarin)     Status: Abnormal   Collection Time: 06/19/15  5:50 PM  Result Value Ref Range   Prothrombin Time 22.9 (H) 11.6 - 15.2 seconds   INR 2.03 (H) 0.00 - 1.49  I-stat troponin, ED     Status: None   Collection Time: 06/19/15  6:16 PM  Result Value Ref Range   Troponin i, poc 0.04 0.00 - 0.08 ng/mL   Comment 3            Comment: Due to the release kinetics of cTnI, a negative result within the first hours of the onset of symptoms does not rule out myocardial infarction with certainty. If myocardial infarction is still suspected, repeat the test at appropriate intervals.   Urinalysis, Routine w reflex microscopic     Status: Abnormal   Collection Time: 06/19/15  9:07 PM  Result Value Ref Range   Color, Urine YELLOW YELLOW   APPearance CLEAR CLEAR   Specific Gravity, Urine 1.014 1.005 - 1.030   pH 5.5 5.0 - 8.0   Glucose, UA NEGATIVE NEGATIVE mg/dL   Hgb urine dipstick NEGATIVE NEGATIVE   Bilirubin Urine NEGATIVE NEGATIVE   Ketones, ur 15 (A) NEGATIVE mg/dL   Protein, ur NEGATIVE NEGATIVE mg/dL   Nitrite NEGATIVE NEGATIVE   Leukocytes, UA NEGATIVE NEGATIVE    Comment: MICROSCOPIC NOT DONE ON URINES WITH NEGATIVE PROTEIN, BLOOD, LEUKOCYTES, NITRITE, OR GLUCOSE <1000 mg/dL.  TSH     Status: None   Collection Time: 06/20/15 12:54 AM  Result Value Ref Range   TSH 1.589 0.350 - 4.500 uIU/mL  T4, free     Status: None   Collection Time: 06/20/15 12:55 AM  Result Value Ref Range   Free T4 1.05 0.61 - 1.12 ng/dL   Dg Chest 2 View  06/19/2015  CLINICAL DATA:  Dizziness and weakness for 1 day. Near syncopal episode. EXAM: CHEST  2 VIEW COMPARISON:  06/17/2015 FINDINGS: Mild cardiomegaly remains stable. Tiny pleural effusions are seen bilaterally. No evidence of pulmonary edema or consolidation. Tortuosity of thoracic aorta remains stable. IMPRESSION: Tiny bilateral pleural  effusions. No evidence of pulmonary edema or consolidation. Stable mild cardiomegaly. Electronically Signed   By: Earle Gell M.D.   On: 06/19/2015 18:31   Mr Jeri Cos IB Contrast  06/20/2015  CLINICAL DATA:  Acute onset dizziness for 3 days, unsteady gait. Symptoms improved with IV fluids. On Coumadin for pulmonary  embolism. History of leukemia, hypertension, diabetes. EXAM: MRI HEAD WITHOUT AND WITH CONTRAST TECHNIQUE: Multiplanar, multiecho pulse sequences of the brain and surrounding structures were obtained without and with intravenous contrast. CONTRAST:  42m MULTIHANCE GADOBENATE DIMEGLUMINE 529 MG/ML IV SOLN COMPARISON:  None. FINDINGS: INTRACRANIAL CONTENTS: Wedge-like 32 x 38 mm reduced diffusion LEFT inferior cerebellum with intermediate to low ADC values. Subcentimeter linear reduced diffusion inferior vermis. Associated FLAIR T2 hyperintense signal. No susceptibility artifact to suggest hemorrhage. Ventricles and sulci are normal for patient's age. Patchy to confluent supratentorial white matter FLAIR T2 hyperintensities. No midline shift, mass effect, masses nor abnormal intracranial enhancement. Postcontrast sequences are moderately motion degraded without convincing evidence of abnormal intracranial enhancement. No abnormal extra-axial fluid collections. Loss of LEFT vertebral artery flow void and occluded LEFT V4 segment. ORBITS: The included ocular globes and orbital contents are non-suspicious. Status post bilateral ocular lens implants. SINUSES: Severe chronic RIGHT maxillary sinusitis without paranasal sinus air-fluid levels. Trace bilateral mastoid effusions. SKULL/SOFT TISSUES: No abnormal sellar expansion. No suspicious calvarial bone marrow signal. Craniocervical junction maintained. 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 Moderate chronic small vessel ischemic  disease. No convincing evidence of abnormal intracranial enhancement on moderately motion degraded post gadolinium sequences. Acute findings discussed with and reconfirmed by Dr.ADELEKE ONI on 06/20/2015 at 5:16 am. Electronically Signed   By: CElon AlasM.D.   On: 06/20/2015 05:17    Assessment: 75y.o. male with multiple risk factors for stroke presenting with acute left inferior cerebellar ischemic infarction.  Stroke Risk Factors - diabetes mellitus, family history, hyperlipidemia and hypertension  Plan: 1. HgbA1c, fasting lipid panel 2. PT consult for unstable gait and disequilibrium 3. Echocardiogram 4. CT angiogram of head and neck with contrast 5. Prophylactic therapy-Anticoagulation: Coumadin 6. Risk factor modification 7. Telemetry monitoring  C.R. SNicole Kindred MD [facility] [phone] 06/20/2015, 7:11 AM

## 2015-06-20 NOTE — ED Notes (Signed)
The pt has had slurred speech difficulty walking since Monday he was in [facility] until yesterday  Diagnosed with dehydration  Pt had dizziness and weakness

## 2015-06-20 NOTE — Progress Notes (Signed)
STROKE TEAM PROGRESS NOTE   HISTORY OF PRESENT ILLNESS [name] is an 75 y.o. male history diabetes mellitus, hypertension, hyperlipidemia, myasthenia gravis, DVT and pulmonary embolus on Coumadin, presenting following 3 days of vertigo with associated nausea as well as unstable gait. Symptoms were of sudden onset at 10:30 AM on 02/17/2015 ([facility]). He had no previous history of stroke nor TIA. He has not experienced diplopia. His been no change in speech or swallowing. He also has not experienced focal weakness of extremities. MRI of his brain showed acute stroke involving left inferior cerebellum, PICA territory. NIH stroke score at the time of this evaluation was 0. Patient was not administered IV t-PA secondary to beyond time window for treatment consideration. He was admitted for further evaluation and treatment.   SUBJECTIVE (INTERVAL HISTORY) No family at bedside. Pt was sleeping but easily aroused after calling his name. Pt had vertigo and nausea for 3 days before presenting to ER. Symptoms getting better now. Still has dizziness if walking.    OBJECTIVE Temp:  [98.3 F (36.8 C)-99.6 F (37.6 C)] 99.6 F (37.6 C) (05/11 0818) Pulse Rate:  [64-72] 72 (05/11 0818) Cardiac Rhythm:  [-] Normal sinus rhythm (05/11 0849) Resp:  [16-20] 16 (05/11 0818) BP: (136-182)/(69-85) 169/72 mmHg (05/11 0818) SpO2:  [94 %-100 %] 99 % (05/11 0818)  CBC:  Recent Labs Lab 06/19/15 1750  WBC 9.7  HGB 11.2*  HCT 34.7*  MCV 94.8  PLT [id number]    Basic Metabolic Panel:  Recent Labs Lab 06/19/15 1750  NA 139  K 3.6  CL 109  CO2 23  GLUCOSE 117*  BUN 6  CREATININE 1.14  CALCIUM 8.5*    Lipid Panel:    Component Value Date/Time   CHOL 133 07/19/2014 1011   TRIG 199.0* 07/19/2014 1011   HDL 39.30 07/19/2014 1011   CHOLHDL 3 07/19/2014 1011   VLDL 39.8 07/19/2014 1011   LDLCALC 54 07/19/2014 1011   HgbA1c:  Lab Results  Component Value Date   HGBA1C 6.0* 03/07/2015   Urine Drug  Screen: No results found for: LABOPIA, COCAINSCRNUR, LABBENZ, AMPHETMU, THCU, LABBARB    IMAGING  Dg Chest 2 View 06/19/2015   Tiny bilateral pleural effusions. No evidence of pulmonary edema or consolidation. Stable mild cardiomegaly.   [name] Contrast 06/20/2015  Acute to early subacute nonhemorrhagic LEFT posterior inferior cerebellar artery territory infarct. LEFT vertebral artery occlusion (less likely slow flow). Moderate chronic small vessel ischemic disease. No convincing evidence of abnormal intracranial enhancement on moderately motion degraded post gadolinium sequences.   CTA head and neck - 1. Acute/early subacute left cerebellar infarct. No hemorrhage. 2. Proximal left V2 vertebral artery occlusion with intermittent distal reconstitution. 3. Patent and dominant right vertebral artery with mild V4 stenosis. 4. Right greater than left intracranial and extracranial carotid artery atherosclerosis without significant stenosis.  TTE - - Left ventricle: The cavity size was normal. Wall thickness was  increased in a pattern of mild LVH. There was mild focal basal  hypertrophy of the septum. Systolic function was normal. The  estimated ejection fraction was in the range of 55% to 60%. Wall  motion was normal; there were no regional wall motion  abnormalities. Doppler parameters are consistent with abnormal  left ventricular relaxation (grade 1 diastolic dysfunction).  Doppler parameters are consistent with high ventricular filling  pressure. - Left atrium: The atrium was mildly dilated.  Impressions:  - Normal LV systolic function; grade 1 diastolic dysfunction;  elevated LV filling pressure; mild LVH; mild LAE.  PHYSICAL EXAM  Temp:  [98.5 F (36.9 C)-99.6 F (37.6 C)] 99.6 F (37.6 C) (05/11 0818) Pulse Rate:  [64-72] 72 (05/11 0818) Resp:  [16-20] 16 (05/11 1818) BP: (136-182)/(61-85) 164/77 mmHg (05/11 1818) SpO2:  [94 %-99 %] 99 % (05/11  1818) Weight:  [190 lb 0.6 oz (86.2 kg)] 190 lb 0.6 oz (86.2 kg) (05/11 1500)  General - Well nourished, well developed, in no apparent distress.  Ophthalmologic - Fundi not visualized due to noncooperation.  Cardiovascular - irregular heart rhythm, tele showed MAT.  Mental Status -  Level of arousal and orientation to time, place, and person were intact. Language including expression, naming, repetition, comprehension was assessed and found intact. Fund of Knowledge was assessed and was intact.  Cranial Nerves II - XII - II - Visual field intact OU. III, IV, VI - Extraocular movements intact. V - Facial sensation intact bilaterally. VII - Facial movement intact bilaterally. VIII - Hearing & vestibular intact bilaterally, no nystagmus. X - Palate elevates symmetrically. XI - Chin turning & shoulder shrug intact bilaterally. XII - Tongue protrusion intact.  Motor Strength - The patient's strength was normal in all extremities and pronator drift was absent.  Bulk was normal and fasciculations were absent.   Motor Tone - Muscle tone was assessed at the neck and appendages and was normal.  Reflexes - The patient's reflexes were 1+ in all extremities and he had no pathological reflexes.  Sensory - Light touch, temperature/pinprick were assessed and were symmetrical.    Coordination - The patient had normal movements in the hands and feet with no ataxia or dysmetria.  Tremor was absent.  Gait and Station - not tested due to safety concerns.   ASSESSMENT/PLAN [name] is a 75 y.o. 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 Etiology could be cardioembolic  as pt has MAT on exam with potential afib vs. Large vessel disease due to chronic left VA occlusion with intermittent reconstitution  Resultant  Ataxia and vertigo  MRI  L PICA and cerebellar vermis infarcts  CTA head and neck  Left VA occlusion with intermittent reconstitution  Repeat CT head Sat am to follow up edema ordered  2D Echo  EF 55-60%  LDL pending  HgbA1c pending  Coumadin  For VTE prophylaxis  Diet Heart Room service appropriate?: Yes; Fluid consistency:: Thin  warfarin daily prior to admission, now on No antithrombotic. Recommend start Eliquis once INR below 2.0.  Patient counseled to be compliant with his antithrombotic medications  Ongoing aggressive stroke risk factor management  Therapy recommendations:  pending   Disposition:  pending  (lives w/ wife)  Has seen Dr. Jannifer Franklin in the past. Follow up with him after discharge.  Hx of DVT and PE  On long term coumadin treatment  INR 2.03 on admission  Pt INR fluctuate  Recommend switch to eliquis once INR below 2.0  Hypertension  Stable  Permissive hypertension (OK if < 220/120) but gradually normalize in 5-7 days  Hyperlipidemia  Home meds:  crestor 5 mg daily  resumed in hospital  LDL pending , goal < 70  Continue statin at discharge  Diabetes  HgbA1c pending, at goal < 7.0  Other Stroke Risk Factors  Advanced age  Family hx stroke (mother, father)  Coronary artery disease  Other Active Problems  Myasthenia gravis on prednisone and mestinon  GERD  CML on [facility] day # 0  Rosalin Hawking, MD PhD Stroke Neurology 06/20/2015 6:53 PM    To contact Stroke Continuity provider, please refer to [email or web address] After hours, contact General Neurology

## 2015-06-20 NOTE — ED Notes (Signed)
The pt was going to mri  But he needed an iv unable to start after one attempt he has swelling in both his arms  With [facility] to [facility]

## 2015-06-20 NOTE — ED Notes (Signed)
The pt passed the swallow screen.  He was given a sprite prior to the swallow screen  Other staff did not ask me if he could have oral liquid  Just gave it

## 2015-06-20 NOTE — H&P (Signed)
History and Physical  Patient Name: [name]     [id number]    DOB: [birth year]    DOA: 06/19/2015 PCP: Renato Shin, MD   Patient coming from: Home after 2 days admission at [facility]     Chief Complaint: Vertigo and gait imbalance  HPI: [name] is a 75 y.o. 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  On Monday, the patient was getting ready for lunch with his wife, when he had abrupt onset of vertigo and imbalance. He also had diaphoresis, lightheadedness, weakness, and vomiting, and so his wife called 9-1-1, and went to the hospital in [place].  There he was diagnosed with dehydration, and observed for 2 days, discharged Wednesday. As soon as he got home, he had inability to walk, continued vertigo and nausea, and so his wife brought him to [facility].  He had no slurred speech, no focal weaknes, no numbness, no chest pain.  In the ED, he was afebrile, hemodynamically stable.  Na 139, K 3.6, Cr 1.14 (baseline), WBC 9K, Hgb 11.2 normocytic, INR 2, UA clear, TSH normal.  Troponin negative.  MR brain showed cerebellar infarcts and TRH were asked to evaluate for admission.     Review of Systems:  Pt complains of vertigo, emesis, sweating, inability to walk. Also complains of dysuria today, tremor. All other systems negative except as just noted or noted in the history of present illness.  Past Medical History  Diagnosis Date  . Coronary atherosclerosis of unspecified type of vessel, native or graft     a. cath in 2003 showed 10-20% LM, scattered 20% prox-mid LAD, 30% more distal LAD, 50-60% stenosis of LAD towards apex, 20% Cx, 30% dRCA, focal 95% stenosis in smaller of 2 branches of PDA, EF 60-65%.  . Mixed hyperlipidemia   . Glucose intolerance (impaired glucose tolerance)     history of  . Diverticulosis of  colon (without mention of hemorrhage)   . Hx of colonic polyps   . Internal hemorrhoids without mention of complication   . Rash and other nonspecific skin eruption   . Conjunctivitis unspecified   . Myasthenia gravis ([place])     uses prednisone  . GERD (gastroesophageal reflux disease)   . Pulmonary embolism [facility]) June 2013    a. Bilateral PE 07/2011.  Marland Kitchen DVT (deep venous thrombosis) [facility]) June 2013  . Chronic anticoagulation   . Diabetes mellitus ([place])   . DJD (degenerative joint disease)   . Ptosis   . CML (chronic myeloid leukemia) ([place])   . Essential hypertension   . IBS (irritable bowel syndrome)   . History of pneumonia   . Dystonia   . 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 PONV (postoperative nausea and vomiting)   . External hemorrhoids without mention of complication   . History of kidney stones   . Tubulovillous adenoma polyp of colon     2010  . HOH (hard of hearing)     hearing aids  . Osteoporosis 2016  . Gastroparesis   . Esophageal stricture   . Chronic edema   . Carotid artery disease ([place])     a. Duplex 05/2014: 123456 RICA, 123456 LICA, elevated velocities in right subclavian artery, normal left subclavian artery..    Past Surgical History  Procedure Laterality Date  . Angioplasty  2001  .  Back surgery      x2, lumbar and cervical  . Tonsillectomy    . Cataract extraction Bilateral 12/12  . Shoulder surgery Left 05/07/2009  . Cardiac catheterization    . Lumbar laminectomy/decompression microdiscectomy Right 09/14/2012    Procedure: Right Lumbar three-four, four-five, Lumbar five-Sacral one decompressive laminectomy;  Surgeon: [name], MD;  Location: [facility];  Service: Neurosurgery;  Laterality: Right;  . Esophagogastroduodenoscopy endoscopy  04/2015    Social History: Patient lives With his wife.  Patient walks unassisted, he does yard work regularly.  He does not smoke.  He lives in [place].    Allergies  Allergen Reactions  .  Oxycodone-Acetaminophen Other (See Comments)    "too strong"  . Phenergan [Promethazine Hcl]     Eye swelling  . Sulfamethoxazole Rash and Itching    Family history: family history includes Breast cancer in his sister; Clotting disorder in his sister; Dementia in his sister; Heart attack in his father, mother, sister, sister, and sister; Heart disease in his father, mother, and sister; Stroke in his father and mother. There is no history of Colon cancer.  Prior to Admission medications   Medication Sig Start Date End Date Taking? Authorizing Provider  brimonidine-timolol (COMBIGAN) 0.2-0.5 % ophthalmic solution Place 1 drop into both eyes every 12 (twelve) hours.   Yes Historical Provider, MD  esomeprazole (NEXIUM) 40 MG capsule Take 1 capsule (40 mg total) by mouth 2 (two) times daily. Patient taking differently: Take 40 mg by mouth every morning.  04/01/15  Yes Lori P Hvozdovic, PA-C  furosemide (LASIX) 20 MG tablet Take 20 mg by mouth daily.  03/28/15  Yes Historical Provider, MD  imatinib (GLEEVEC) 400 MG tablet Take 400 mg by mouth daily with lunch.    Yes Historical Provider, MD  isosorbide mononitrate (IMDUR) 30 MG 24 hr tablet TAKE 1 TABLET EVERY DAY 09/04/14  Yes Sherren Mocha, MD  loperamide (IMODIUM) 2 MG capsule Take 2 mg by mouth every morning. For diarrhea   Yes Historical Provider, MD  LORazepam (ATIVAN) 0.5 MG tablet Take 0.5 mg by mouth 3 times/day as needed-between meals & bedtime for sleep.   Yes Historical Provider, MD  metoprolol succinate (TOPROL-XL) 25 MG 24 hr tablet TAKE 1 TABLET EVERY DAY 09/04/14  Yes Sherren Mocha, MD  Multiple Vitamin (MULTIVITAMIN) tablet Take 1 tablet by mouth daily.   Yes Historical Provider, MD  nitroGLYCERIN (NITROSTAT) 0.4 MG SL tablet Place 1 tablet (0.4 mg total) under the tongue every 5 (five) minutes x 3 doses as needed for chest pain. 05/22/14  Yes Jerline Pain, MD  predniSONE (DELTASONE) 5 MG tablet Take 1 tablet (5 mg total) by mouth  daily. 11/08/14  Yes Kathrynn Ducking, MD  pyridostigmine (MESTINON) 60 MG tablet Take 1 tablet (60 mg total) by mouth 3 (three) times daily. 11/08/14  Yes Kathrynn Ducking, MD  rosuvastatin (CRESTOR) 10 MG tablet TAKE 1/2 TABLETS BY MOUTH DAILY. 06/10/15  Yes Sherren Mocha, MD  warfarin (COUMADIN) 5 MG tablet Take 2.5-5 mg by mouth daily. Saturday and Wednesday take 5mg , all other days take 2.5mg .   Yes Historical Provider, MD     Physical Exam: BP 162/83 mmHg  Pulse 64  Temp(Src) 99.2 F (37.3 C) (Oral)  Resp 19  SpO2 97% General appearance: Well-developed, elderly adult male, alert and in moderate from vertigo.  Tremor/shakes.   Eyes: Anicteric, conjunctiva red and puffy, lids and lashes puffy.  No droop.  ENT: No nasal deformity, discharge, or epistaxis.  OP moist without lesions.   Lymph: No cervical, supraclavicular or axillary lymphadenopathy. Skin: Warm and dry.  No suspicious rashes or lesions. Cardiac: RRR, nl S1-S2, no murmurs appreciated.  Capillary refill is brisk.  JVP not visible.  2+ LE edema.  Radial and DP pulses 2+ and symmetric. Respiratory: Normal respiratory rate and rhythm.  CTAB without rales or wheezes. Abdomen: Abdomen soft without rigidity.  Firm but no rebound or TTP. No ascites, distension.   MSK: No deformities or effusions. Neuro: Pupils are 4 mm and reactive to 3 mm. Extraocular movements are intact, with a few beats of nystagmus. Cranial nerve 5 is within normal limits. Cranial nerve 7 is symmetrical. Cranial nerve 8 is within normal limits. Cranial nerves 9 and 10 reveal equal palate elevation. Cranial nerve 11 reveals sternocleidomastoid strong. Cranial nerve 12 is midline. I do not note a deficit in motor strength testing in the upper and lower extremities bilaterally with normal motor, tone and bulk. Unable to sit up unassisted, titubation?  Finger-to-nose testing is impaired. The patient is oriented to time, place and person. Speech is fluent.  Naming is grossly intact. Recall, recent and remote, as well as general fund of knowledge seem within normal limits. Attention span and concentration are within normal limits.   Psych: Behavior appropriate.  Affect normal, anxious.  No evidence of aural or visual hallucinations or delusions.       Labs on Admission:  The metabolic panel shows normal electro lites renal function. The complete blood count shows mild normocytic anemia, new. INR 2. Troponin negative. Urinalysis without pyuria or hematuria or bacteria. TSH normal   Radiological Exams on Admission: Personally reviewed: Dg Chest 2 View  06/19/2015  CLINICAL DATA:  Dizziness and weakness for 1 day. Near syncopal episode. EXAM: CHEST  2 VIEW COMPARISON:  06/17/2015 FINDINGS: Mild cardiomegaly remains stable. Tiny pleural effusions are seen bilaterally. No evidence of pulmonary edema or consolidation. Tortuosity of thoracic aorta remains stable. IMPRESSION: Tiny bilateral pleural effusions. No evidence of pulmonary edema or consolidation. Stable mild cardiomegaly. Electronically Signed   By: Earle Gell M.D.   On: 06/19/2015 18:31   Mr Jeri Cos [id number] Contrast  06/20/2015  CLINICAL DATA:  Acute onset dizziness for 3 days, unsteady gait. Symptoms improved with IV fluids. On Coumadin for pulmonary embolism. History of leukemia, hypertension, diabetes. EXAM: MRI HEAD WITHOUT AND WITH CONTRAST TECHNIQUE: Multiplanar, multiecho pulse sequences of the brain and surrounding structures were obtained without and with intravenous contrast. CONTRAST:  76mL MULTIHANCE GADOBENATE DIMEGLUMINE 529 MG/ML IV SOLN COMPARISON:  None. FINDINGS: INTRACRANIAL CONTENTS: Wedge-like 32 x 38 mm reduced diffusion LEFT inferior cerebellum with intermediate to low ADC values. Subcentimeter linear reduced diffusion inferior vermis. Associated FLAIR T2 hyperintense signal. No susceptibility artifact to suggest hemorrhage. Ventricles and sulci are normal for patient's age.  Patchy to confluent supratentorial white matter FLAIR T2 hyperintensities. No midline shift, mass effect, masses nor abnormal intracranial enhancement. Postcontrast sequences are moderately motion degraded without convincing evidence of abnormal intracranial enhancement. No abnormal extra-axial fluid collections. Loss of LEFT vertebral artery flow void and occluded LEFT V4 segment. ORBITS: The included ocular globes and orbital contents are non-suspicious. Status post bilateral ocular lens implants. SINUSES: Severe chronic RIGHT maxillary sinusitis without paranasal sinus air-fluid levels. Trace bilateral mastoid effusions. SKULL/SOFT TISSUES: No abnormal sellar expansion. No suspicious calvarial bone marrow signal. Craniocervical junction maintained. IMPRESSION: Acute to early subacute nonhemorrhagic LEFT posterior inferior cerebellar artery territory  infarct. LEFT vertebral artery occlusion (less likely slow flow). Moderate chronic small vessel ischemic disease. No convincing evidence of abnormal intracranial enhancement on moderately motion degraded post gadolinium sequences. Acute findings discussed with and reconfirmed by Dr.ADELEKE ONI on 06/20/2015 at 5:16 am. Electronically Signed   By: Elon Alas M.D.   On: 06/20/2015 05:17     EKG: Independently reviewed. Rate 69, QTC 428, no ST changes.   Recent carotid ultrasound: Right 40-60% stenosis, left 1-40% stenosis.   Echocardiogram 04/2014: 65-70% EF with grade I diastolic dysfunction, no significant valvular disaese.    Assessment/Plan 1. Acute cerebellar stroke:  This is new.  MRI shows "Wedge-like 32 x 38 mm reduced diffusion LEFT inferior cerebellum with intermediate to low ADC values. Subcentimeter linear reduced diffusion inferior vermis".  Onset Monday, outside tPA window.   -Admit to telemetry -Neuro checks, NIHSS per protocol -Anticoagulation with warfarin -Lipids ordered, last hemoglobin A1c 6% in January -CT angiography of  head and neck ordered -Echocardiogram ordered -PT/OT/SLP consultation -Consult to Neurology, appreciate recommendations   2. Myasthenia gravis:  Stable. -Continue home prednisone and pyridostigmine  3. Coronary disease and chronic diastolic CHF:  -Continue home Imdur, BB, statin, and furosemide   4. CML:  -Continue home [place]  5. Hx of PE:  -Continue home warfarin  6. NIDDM:  Stable. Last HgbA1c 6% in January. -Hold home metformin -Sliding scale corrections      DVT prophylaxis: Warfarin  Code Status: FULL  Family Communication: Wife at bedside  Disposition Plan: Anticipate Stroke work up as above and consult to ancillary services.  Expect discharge within 2-3 days to home with home health PT or SNF. Consults called: Neurology  Admission status: Telemetry, INPatient status   Medical decision making: Patient seen at 5:48 AM on 06/20/2015.  The patient was discussed with Dr. Claudine Mouton and Dr. Nicole Kindred. Clinical condition: stable.       Edwin Dada [facility] Pager [phone]

## 2015-06-20 NOTE — ED Notes (Signed)
Attempt madeto call report  Unable  rn will call me back

## 2015-06-20 NOTE — ED Notes (Signed)
To mri 

## 2015-06-20 NOTE — Care Management Note (Signed)
Case Management Note  Patient Details  Name: ERYCK COEN MRN: [id number] Date of Birth: 03-11-1940  Subjective/Objective:                    Action/Plan: Patient was admitted with CVA. Lives at home with wife. Will follow for discharge needs pending PT/OT evals and physician orders.   Expected Discharge Date:                  Expected Discharge Plan:     In-House Referral:     Discharge planning Services     Post Acute Care Choice:    Choice offered to:     DME Arranged:    DME Agency:     HH Arranged:    HH Agency:     Status of Service:  In process, will continue to follow  Medicare Important Message Given:    Date Medicare IM Given:    Medicare IM give by:    Date Additional Medicare IM Given:    Additional Medicare Important Message give by:     If discussed at [place] of Stay Meetings, dates discussed:    Additional Comments:  Rolm Baptise, RN 06/20/2015, 9:17 AM [phone]

## 2015-06-20 NOTE — ED Notes (Signed)
asleep

## 2015-06-20 NOTE — ED Notes (Signed)
Pt returned from mri

## 2015-06-20 NOTE — Progress Notes (Signed)
PROGRESS NOTE    [name]  [id number] DOB: March 29, 1940 DOA: 06/19/2015 PCP: Renato Shin, MD   Outpatient Specialists:    Brief Narrative: 75 y.o. 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 3 days ago, the patient was getting ready for lunch with his wife, when he had abrupt onset of vertigo and imbalance. He also had diaphoresis, lightheadedness, weakness, and vomiting, and so his wife called 9-1-1, and went to the hospital in [place]. There he was diagnosed with dehydration, and observed for 2 days, discharged Wednesday. As soon as he got home, he had inability to walk, continued vertigo and nausea, and so his wife brought him to [facility]. He had no slurred speech, no focal weaknes, no numbness, no chest pain.MR brain showed cerebellar infarcts   Assessment & Plan:   Active Problems:   Cerebral infarction involving left cerebellar artery ([place])  1. Acute cerebellar stroke: Discussed with the Neurology team. Neuro will change coumadin to Elliquis. Updated patient's family. PT/OT/Case management consult. May likely need rehab. Stroke work up as per stroke team. 2. Myasthenia gravis: Stable. 3. Coronary disease and chronic diastolic CHF: Stable. 4. CML: Continue home [place] 5. Hx of PE: Elliquis as above. 6. NIDDM: Optimize.    DVT prophylaxis: Anticoagulated Code Status: Full Family Communication: Wife and daughter. Disposition Plan: May need rehab Consultants:   Neurology   Subjective: Nil new complaints. Still having vertigo.  Objective: Filed Vitals:   06/20/15 1218 06/20/15 1418 06/20/15 1500 06/20/15 1618  BP: 159/65 143/64  158/68  Pulse:      Temp:      TempSrc:      Resp: 16 16  16   Weight:   86.2 kg (190 lb 0.6 oz)   SpO2: 99% 99%  99%    Intake/Output Summary (Last 24 hours) at 06/20/15  1748 Last data filed at 06/20/15 1738  Gross per 24 hour  Intake    460 ml  Output    725 ml  Net   -265 ml   Filed Weights   06/20/15 1500  Weight: 86.2 kg (190 lb 0.6 oz)    Examination:  General exam: Appears calm and comfortable  Respiratory system: Clear to auscultation.  Cardiovascular system: S1 & S2. Gastrointestinal system: Abdomen is obese, soft and nontender. No organomegaly or masses felt. Normal bowel sounds heard. Central nervous system: Alert and oriented. Gait imbalance Extremities: No leg edema.  Data Reviewed: I have personally reviewed following labs and imaging studies  CBC:  Recent Labs Lab 06/19/15 1750  WBC 9.7  HGB 11.2*  HCT 34.7*  MCV 94.8  PLT [id number]   Basic Metabolic Panel:  Recent Labs Lab 06/19/15 1750  NA 139  K 3.6  CL 109  CO2 23  GLUCOSE 117*  BUN 6  CREATININE 1.14  CALCIUM 8.5*   GFR: Estimated Creatinine Clearance: 55.3 mL/min (by C-G formula based on Cr of 1.14). Liver Function Tests: No results for input(s): AST, ALT, ALKPHOS, BILITOT, PROT, ALBUMIN in the last 168 hours. No results for input(s): LIPASE, AMYLASE in the last 168 hours. No results for input(s): AMMONIA in the last 168 hours. Coagulation Profile:  Recent Labs Lab 06/19/15 1750  INR 2.03*   Cardiac Enzymes: No results for input(s): CKTOTAL, CKMB, CKMBINDEX, TROPONINI in the last 168 hours. BNP (last 3 results)  Recent Labs  03/13/15 1534  PROBNP 58.0   HbA1C: No results for input(s): HGBA1C in the last 72 hours. CBG: No results for input(s): GLUCAP in the last 168 hours. Lipid Profile: No results for input(s): CHOL, HDL, LDLCALC, TRIG, CHOLHDL, LDLDIRECT in the last 72 hours. Thyroid Function Tests:  Recent Labs  06/20/15 0054 06/20/15 0055  TSH 1.589  --   FREET4  --  1.05   Anemia Panel: No results for input(s): VITAMINB12, FOLATE, FERRITIN, TIBC, IRON, RETICCTPCT in the last 72 hours. Urine analysis:    Component Value  Date/Time   COLORURINE YELLOW 06/19/2015 2107   APPEARANCEUR CLEAR 06/19/2015 2107   LABSPEC 1.014 06/19/2015 2107   PHURINE 5.5 06/19/2015 2107   GLUCOSEU NEGATIVE 06/19/2015 2107   GLUCOSEU NEGATIVE 04/09/2014 0910   HGBUR NEGATIVE 06/19/2015 2107   BILIRUBINUR NEGATIVE 06/19/2015 2107   KETONESUR 15* 06/19/2015 2107   PROTEINUR NEGATIVE 06/19/2015 2107   UROBILINOGEN 1.0 04/09/2014 0910   NITRITE NEGATIVE 06/19/2015 2107   LEUKOCYTESUR NEGATIVE 06/19/2015 2107   Sepsis Labs: @LABRCNTIP (procalcitonin:4,lacticidven:4)  )No results found for this or any previous visit (from the past 240 hour(s)).       Radiology Studies: Ct Angio Head W/cm &/or Wo Cm  06/20/2015  CLINICAL DATA:  Cerebellar stroke on MRI.  Dizziness. EXAM: CT ANGIOGRAPHY HEAD AND NECK TECHNIQUE: Multidetector CT imaging of the head and neck was performed using the standard protocol during bolus administration of intravenous contrast. Multiplanar CT image reconstructions and MIPs were obtained to evaluate the vascular anatomy. Carotid stenosis measurements (when applicable) are obtained utilizing NASCET criteria, using the distal internal carotid diameter as the denominator. CONTRAST:  50 mL Isovue 370 COMPARISON:  Brain MRI 06/20/2015.  Soft tissue neck CT 11/09/2011. FINDINGS: CT HEAD Brain: Left cerebellar edema corresponds to the acute/early subacute infarct described on earlier MRI. There is no evidence of acute intracranial hemorrhage, mass, midline shift, or extra-axial fluid collection. Ventricles and sulci are within normal limits for age. Cerebral white matter hypodensities are nonspecific but compatible with moderate chronic small vessel ischemic disease. Calvarium and skull base: 1.6 cm lytic lesion in the right parietal bone is benign in appearance on MRI. Paranasal sinuses: Subtotal opacification of the right maxillary sinus. Small bilateral mastoid effusions. Orbits: Prior bilateral cataract extraction and  bilateral exophthalmos. CTA NECK Aortic arch: 3 vessel aortic arch with mild-to-moderate atherosclerotic plaque. Brachiocephalic artery is widely patent. There is a kinked appearance of the proximal right subclavian artery with calcified plaque eccentrically at the origin. In combination, these findings result in approximately 65% narrowing. Moderate atherosclerotic plaque in the proximal left subclavian artery does not result in significant stenosis. Right carotid system: Widely patent common carotid artery. There is prominent mixed calcified and noncalcified plaque in the proximal ICA which results in moderate luminal irregularity but no significant stenosis. The more distal cervical ICA is unremarkable. Left carotid system: Widely patent common carotid artery with minimal atherosclerotic irregularity. Mild non stenotic calcified plaque at the carotid bifurcation. Otherwise unremarkable cervical ICA. Vertebral arteries: The right vertebral artery is widely patent and dominant with only trace calcified plaque near its origin. Calcified plaque at the left vertebral artery origin results in moderate stenosis. There is occlusion of the left V2 segment at the C5 level with only intermittent reconstitution of the distal V2 and V3 segments. Skeleton: Moderate to advanced disc and facet degeneration throughout the cervical spine. 3 mm degenerative anterolisthesis of C7 on T1. Other neck: Interlobular septal thickening in the visualized right greater  than left lung apices which may reflect mild edema. Partially visualized small right pleural effusion. CTA HEAD Anterior circulation: Intracranial internal carotid arteries are patent with moderate right and mild left calcified plaque without significant stenosis. ACAs and MCAs are patent with mild branch vessel irregularity but no evidence of major branch occlusion or significant proximal stenosis. No intracranial aneurysm is identified. Posterior circulation: Intracranial  right vertebral artery is patent with mild stenosis due to calcified plaque. There is reconstitution of the intracranial left vertebral artery which may reflect retrograde flow. Both PICA is are grossly patent. AICA and SCA origins are patent, with possible stenosis of the right SCA origin. Basilar artery is widely patent. There are patent posterior communicating arteries bilaterally. PCAs are patent without evidence of significant stenosis. Venous sinuses: Patent. Anatomic variants: None. Delayed phase: No abnormal enhancement. IMPRESSION: 1. Acute/early subacute left cerebellar infarct.  No hemorrhage. 2. Proximal left V2 vertebral artery occlusion with intermittent distal reconstitution. 3. Patent and dominant right vertebral artery with mild V4 stenosis. 4. Right greater than left intracranial and extracranial carotid artery atherosclerosis without significant stenosis. Electronically Signed   By: Logan Bores M.D.   On: 06/20/2015 13:16   Dg Chest 2 View  06/19/2015  CLINICAL DATA:  Dizziness and weakness for 1 day. Near syncopal episode. EXAM: CHEST  2 VIEW COMPARISON:  06/17/2015 FINDINGS: Mild cardiomegaly remains stable. Tiny pleural effusions are seen bilaterally. No evidence of pulmonary edema or consolidation. Tortuosity of thoracic aorta remains stable. IMPRESSION: Tiny bilateral pleural effusions. No evidence of pulmonary edema or consolidation. Stable mild cardiomegaly. Electronically Signed   By: Earle Gell M.D.   On: 06/19/2015 18:31   Ct Head Wo Contrast  06/20/2015  CLINICAL DATA:  Cerebellar stroke on MRI.  Dizziness. EXAM: CT ANGIOGRAPHY HEAD AND NECK TECHNIQUE: Multidetector CT imaging of the head and neck was performed using the standard protocol during bolus administration of intravenous contrast. Multiplanar CT image reconstructions and MIPs were obtained to evaluate the vascular anatomy. Carotid stenosis measurements (when applicable) are obtained utilizing NASCET criteria, using the  distal internal carotid diameter as the denominator. CONTRAST:  50 mL Isovue 370 COMPARISON:  Brain MRI 06/20/2015.  Soft tissue neck CT 11/09/2011. FINDINGS: CT HEAD Brain: Left cerebellar edema corresponds to the acute/early subacute infarct described on earlier MRI. There is no evidence of acute intracranial hemorrhage, mass, midline shift, or extra-axial fluid collection. Ventricles and sulci are within normal limits for age. Cerebral white matter hypodensities are nonspecific but compatible with moderate chronic small vessel ischemic disease. Calvarium and skull base: 1.6 cm lytic lesion in the right parietal bone is benign in appearance on MRI. Paranasal sinuses: Subtotal opacification of the right maxillary sinus. Small bilateral mastoid effusions. Orbits: Prior bilateral cataract extraction and bilateral exophthalmos. CTA NECK Aortic arch: 3 vessel aortic arch with mild-to-moderate atherosclerotic plaque. Brachiocephalic artery is widely patent. There is a kinked appearance of the proximal right subclavian artery with calcified plaque eccentrically at the origin. In combination, these findings result in approximately 65% narrowing. Moderate atherosclerotic plaque in the proximal left subclavian artery does not result in significant stenosis. Right carotid system: Widely patent common carotid artery. There is prominent mixed calcified and noncalcified plaque in the proximal ICA which results in moderate luminal irregularity but no significant stenosis. The more distal cervical ICA is unremarkable. Left carotid system: Widely patent common carotid artery with minimal atherosclerotic irregularity. Mild non stenotic calcified plaque at the carotid bifurcation. Otherwise unremarkable cervical ICA. Vertebral arteries: The  right vertebral artery is widely patent and dominant with only trace calcified plaque near its origin. Calcified plaque at the left vertebral artery origin results in moderate stenosis. There is  occlusion of the left V2 segment at the C5 level with only intermittent reconstitution of the distal V2 and V3 segments. Skeleton: Moderate to advanced disc and facet degeneration throughout the cervical spine. 3 mm degenerative anterolisthesis of C7 on T1. Other neck: Interlobular septal thickening in the visualized right greater than left lung apices which may reflect mild edema. Partially visualized small right pleural effusion. CTA HEAD Anterior circulation: Intracranial internal carotid arteries are patent with moderate right and mild left calcified plaque without significant stenosis. ACAs and MCAs are patent with mild branch vessel irregularity but no evidence of major branch occlusion or significant proximal stenosis. No intracranial aneurysm is identified. Posterior circulation: Intracranial right vertebral artery is patent with mild stenosis due to calcified plaque. There is reconstitution of the intracranial left vertebral artery which may reflect retrograde flow. Both PICA is are grossly patent. AICA and SCA origins are patent, with possible stenosis of the right SCA origin. Basilar artery is widely patent. There are patent posterior communicating arteries bilaterally. PCAs are patent without evidence of significant stenosis. Venous sinuses: Patent. Anatomic variants: None. Delayed phase: No abnormal enhancement. IMPRESSION: 1. Acute/early subacute left cerebellar infarct.  No hemorrhage. 2. Proximal left V2 vertebral artery occlusion with intermittent distal reconstitution. 3. Patent and dominant right vertebral artery with mild V4 stenosis. 4. Right greater than left intracranial and extracranial carotid artery atherosclerosis without significant stenosis. Electronically Signed   By: Logan Bores M.D.   On: 06/20/2015 13:16   Ct Angio Neck W/cm &/or Wo/cm  06/20/2015  CLINICAL DATA:  Cerebellar stroke on MRI.  Dizziness. EXAM: CT ANGIOGRAPHY HEAD AND NECK TECHNIQUE: Multidetector CT imaging of the  head and neck was performed using the standard protocol during bolus administration of intravenous contrast. Multiplanar CT image reconstructions and MIPs were obtained to evaluate the vascular anatomy. Carotid stenosis measurements (when applicable) are obtained utilizing NASCET criteria, using the distal internal carotid diameter as the denominator. CONTRAST:  50 mL Isovue 370 COMPARISON:  Brain MRI 06/20/2015.  Soft tissue neck CT 11/09/2011. FINDINGS: CT HEAD Brain: Left cerebellar edema corresponds to the acute/early subacute infarct described on earlier MRI. There is no evidence of acute intracranial hemorrhage, mass, midline shift, or extra-axial fluid collection. Ventricles and sulci are within normal limits for age. Cerebral white matter hypodensities are nonspecific but compatible with moderate chronic small vessel ischemic disease. Calvarium and skull base: 1.6 cm lytic lesion in the right parietal bone is benign in appearance on MRI. Paranasal sinuses: Subtotal opacification of the right maxillary sinus. Small bilateral mastoid effusions. Orbits: Prior bilateral cataract extraction and bilateral exophthalmos. CTA NECK Aortic arch: 3 vessel aortic arch with mild-to-moderate atherosclerotic plaque. Brachiocephalic artery is widely patent. There is a kinked appearance of the proximal right subclavian artery with calcified plaque eccentrically at the origin. In combination, these findings result in approximately 65% narrowing. Moderate atherosclerotic plaque in the proximal left subclavian artery does not result in significant stenosis. Right carotid system: Widely patent common carotid artery. There is prominent mixed calcified and noncalcified plaque in the proximal ICA which results in moderate luminal irregularity but no significant stenosis. The more distal cervical ICA is unremarkable. Left carotid system: Widely patent common carotid artery with minimal atherosclerotic irregularity. Mild non stenotic  calcified plaque at the carotid bifurcation. Otherwise unremarkable cervical ICA. Vertebral  arteries: The right vertebral artery is widely patent and dominant with only trace calcified plaque near its origin. Calcified plaque at the left vertebral artery origin results in moderate stenosis. There is occlusion of the left V2 segment at the C5 level with only intermittent reconstitution of the distal V2 and V3 segments. Skeleton: Moderate to advanced disc and facet degeneration throughout the cervical spine. 3 mm degenerative anterolisthesis of C7 on T1. Other neck: Interlobular septal thickening in the visualized right greater than left lung apices which may reflect mild edema. Partially visualized small right pleural effusion. CTA HEAD Anterior circulation: Intracranial internal carotid arteries are patent with moderate right and mild left calcified plaque without significant stenosis. ACAs and MCAs are patent with mild branch vessel irregularity but no evidence of major branch occlusion or significant proximal stenosis. No intracranial aneurysm is identified. Posterior circulation: Intracranial right vertebral artery is patent with mild stenosis due to calcified plaque. There is reconstitution of the intracranial left vertebral artery which may reflect retrograde flow. Both PICA is are grossly patent. AICA and SCA origins are patent, with possible stenosis of the right SCA origin. Basilar artery is widely patent. There are patent posterior communicating arteries bilaterally. PCAs are patent without evidence of significant stenosis. Venous sinuses: Patent. Anatomic variants: None. Delayed phase: No abnormal enhancement. IMPRESSION: 1. Acute/early subacute left cerebellar infarct.  No hemorrhage. 2. Proximal left V2 vertebral artery occlusion with intermittent distal reconstitution. 3. Patent and dominant right vertebral artery with mild V4 stenosis. 4. Right greater than left intracranial and extracranial carotid  artery atherosclerosis without significant stenosis. Electronically Signed   By: Logan Bores M.D.   On: 06/20/2015 13:16   Mr Jeri Cos [id number] Contrast  06/20/2015  CLINICAL DATA:  Acute onset dizziness for 3 days, unsteady gait. Symptoms improved with IV fluids. On Coumadin for pulmonary embolism. History of leukemia, hypertension, diabetes. EXAM: MRI HEAD WITHOUT AND WITH CONTRAST TECHNIQUE: Multiplanar, multiecho pulse sequences of the brain and surrounding structures were obtained without and with intravenous contrast. CONTRAST:  21mL MULTIHANCE GADOBENATE DIMEGLUMINE 529 MG/ML IV SOLN COMPARISON:  None. FINDINGS: INTRACRANIAL CONTENTS: Wedge-like 32 x 38 mm reduced diffusion LEFT inferior cerebellum with intermediate to low ADC values. Subcentimeter linear reduced diffusion inferior vermis. Associated FLAIR T2 hyperintense signal. No susceptibility artifact to suggest hemorrhage. Ventricles and sulci are normal for patient's age. Patchy to confluent supratentorial white matter FLAIR T2 hyperintensities. No midline shift, mass effect, masses nor abnormal intracranial enhancement. Postcontrast sequences are moderately motion degraded without convincing evidence of abnormal intracranial enhancement. No abnormal extra-axial fluid collections. Loss of LEFT vertebral artery flow void and occluded LEFT V4 segment. ORBITS: The included ocular globes and orbital contents are non-suspicious. Status post bilateral ocular lens implants. SINUSES: Severe chronic RIGHT maxillary sinusitis without paranasal sinus air-fluid levels. Trace bilateral mastoid effusions. SKULL/SOFT TISSUES: No abnormal sellar expansion. No suspicious calvarial bone marrow signal. Craniocervical junction maintained. 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 Moderate chronic small vessel ischemic disease. No convincing evidence  of abnormal intracranial enhancement on moderately motion degraded post gadolinium sequences. Acute findings discussed with and reconfirmed by Dr.ADELEKE ONI on 06/20/2015 at 5:16 am. Electronically Signed   By: Elon Alas M.D.   On: 06/20/2015 05:17        Scheduled Meds: Continuous Infusions:   LOS: 0 days    Time spent: 35 Mins    Bonnell Public, MD [facility] Pager [phone]  If 7PM-7AM, please contact night-coverage [email or web address] Password [facility] 06/20/2015, 5:48 PM

## 2015-06-20 NOTE — Progress Notes (Signed)
ANTICOAGULATION CONSULT NOTE - Follow Up Consult  Pharmacy Consult for Apixaban Indication: Hx DVT/PE and new CVA this admit  Allergies  Allergen Reactions  . Oxycodone-Acetaminophen Other (See Comments)    "too strong"  . Phenergan [Promethazine Hcl]     Eye swelling  . Sulfamethoxazole Rash and Itching    Patient Measurements: Height:  (5' 5.5') Weight: 190 lb 0.6 oz (86.2 kg) (from 05/13/15)  Vital Signs: Temp: 99.6 F (37.6 C) (05/11 0818) Temp Source: Oral (05/11 0818) BP: 143/64 mmHg (05/11 1418) Pulse Rate: 72 (05/11 0818)  Labs:  Recent Labs  06/19/15 1750  HGB 11.2*  HCT 34.7*  PLT 159  LABPROT 22.9*  INR 2.03*  CREATININE 1.14    Estimated Creatinine Clearance: 55.3 mL/min (by C-G formula based on Cr of 1.14).   Assessment: 46 YOM with hx DVT/PE on warfarin PTA who [facility]10 after being discharged from [facility] earlier that day with 3 day history of vertigo, nausea, and unstable gait. Admit INR was therapeutic at 2.03 on admission. MRI showed acute CVA in the L-inferior cerebellum, PICA territory. Pharmacy has been consulted by neurology to transition the patient from warfarin to apixaban for anticoagulation since CVA developed with a therapeutic INR.   Anticoagulation was clarified with the neurology team since both apixaban and warfarin per pharmacy were ordered (apixaban by neuro and warfarin by [facility]). Neurology confirmed plans to transition to apixaban. Plans are to recheck the INR on 5/12 AM and if <2 - will initiation apixaban at that time.   Goal of Therapy:  Appropriate anticoagulation for indication and hepatic/renal function   Plan:  1. Hold apixaban this evening 2. Recheck INR on 5/12 AM - if <2, will plan to start apixaban at that time (plans discussed and approved with neuro team)  Alycia Rossetti, PharmD, BCPS Clinical Pharmacist Pager: [phone] 06/20/2015 3:56 PM

## 2015-06-20 NOTE — ED Notes (Signed)
Pt still in mri medd given in mri another 1 mg ativan

## 2015-06-20 NOTE — Progress Notes (Signed)
  Echocardiogram 2D Echocardiogram has been performed.  [name] 06/20/2015, 4:00 PM

## 2015-06-20 NOTE — ED Notes (Signed)
Dr Nicole Kindred at the bedside

## 2015-06-21 LAB — LIPID PANEL
CHOL/HDL RATIO: 4 ratio
CHOLESTEROL: 104 mg/dL (ref 0–200)
HDL: 26 mg/dL — ABNORMAL LOW (ref >40)
LDL Cholesterol: 52 mg/dL (ref 0–99)
Triglycerides: 129 mg/dL (ref <150)
VLDL: 26 mg/dL (ref 0–40)

## 2015-06-21 LAB — PROTIME-INR
INR: 1.56 — AB (ref 0.00–1.49)
Prothrombin Time: 18.7 seconds — ABNORMAL HIGH (ref 11.6–15.2)

## 2015-06-21 MED ORDER — APIXABAN 5 MG PO TABS
5.0000 mg | ORAL_TABLET | Freq: Two times a day (BID) | ORAL | Status: DC
  Administered 2015-06-21 – 2015-06-24 (×7): 5 mg via ORAL
  Filled 2015-06-21 (×7): qty 1

## 2015-06-21 NOTE — Progress Notes (Signed)
Bed alarm activated 

## 2015-06-21 NOTE — Progress Notes (Signed)
PROGRESS NOTE    [name]  [id number] DOB: [birth year] DOA: 06/19/2015 PCP: Renato Shin, MD   Outpatient Specialists:    Brief Narrative: 75 y.o. 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 3 days ago, the patient was getting ready for lunch with his wife, when he had abrupt onset of vertigo and imbalance. He also had diaphoresis, lightheadedness, weakness, and vomiting, and so his wife called 9-1-1, and went to the hospital in [place]. There he was diagnosed with dehydration, and observed for 2 days, discharged Wednesday. As soon as he got home, he had inability to walk, continued vertigo and nausea, and so his wife brought him to [facility]. 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 Acute cerebellar stroke: Discussed with the Neurology team. Neuro will change coumadin to Elliquis. Updated patient's family. PT/OT/Case management consult. May likely need rehab. Disposition will depend on rehab recommendation. 2. Myasthenia gravis: Stable. 3. Coronary disease and chronic diastolic CHF: Stable. 4. CML: Continue home [place] 5. Hx of PE: Elliquis as above. 6. NIDDM: Optimize.    DVT prophylaxis: Anticoagulated Code Status: Full Family Communication: Wife and daughter. Disposition Plan: May need rehab Consultants:   Neurology   Subjective: Nil new complaints. Still having vertigo.  Objective: Filed Vitals:   06/21/15 0137 06/21/15  0556 06/21/15 1039 06/21/15 1409  BP: 155/74 116/62 121/67 141/67  Pulse: 72 68 63 70  Temp: 98.6 F (37 C) 97.4 F (36.3 C) 98.6 F (37 C) 98.2 F (36.8 C)  TempSrc: Oral Oral Oral Oral  Resp: 20 18 18 18   Weight:      SpO2: 100% 95% 95% 95%    Intake/Output Summary (Last 24 hours) at 06/21/15 1647 Last data filed at 06/20/15 2045  Gross per 24 hour  Intake    240 ml  Output    350 ml  Net   -110 ml   Filed Weights   06/20/15 1500  Weight: 86.2 kg (190 lb 0.6 oz)    Examination:  General exam: Appears calm and comfortable  Respiratory system: Clear to auscultation.  Cardiovascular system: S1 & S2. Gastrointestinal system: Abdomen is obese, soft and nontender. No organomegaly or masses felt. Normal bowel sounds heard. Central nervous system: Alert and oriented. Gait imbalance Extremities: No leg edema.  Data Reviewed: I have personally reviewed following labs and imaging studies  CBC:  Recent Labs Lab 06/19/15 1750  WBC 9.7  HGB 11.2*  HCT 34.7*  MCV 94.8  PLT [id number]   Basic Metabolic Panel:  Recent Labs Lab 06/19/15 1750  NA 139  K 3.6  CL 109  CO2 23  GLUCOSE 117*  BUN 6  CREATININE 1.14  CALCIUM 8.5*   GFR: Estimated Creatinine Clearance: 55.3 mL/min (by C-G formula based on Cr of 1.14). Liver Function Tests: No results for input(s): AST, ALT, ALKPHOS, BILITOT, PROT, ALBUMIN in the last 168 hours. No results for input(s): LIPASE, AMYLASE in the last 168 hours. No results  for input(s): AMMONIA in the last 168 hours. Coagulation Profile:  Recent Labs Lab 06/19/15 1750 06/21/15 0352  INR 2.03* 1.56*   Cardiac Enzymes: No results for input(s): CKTOTAL, CKMB, CKMBINDEX, TROPONINI in the last 168 hours. BNP (last 3 results)  Recent Labs  03/13/15 1534  PROBNP 58.0   HbA1C: No results for input(s): HGBA1C in the last 72 hours. CBG: No results for input(s): GLUCAP in the last 168 hours. Lipid Profile:  Recent Labs  06/21/15 0352    CHOL 104  HDL 26*  LDLCALC 52  TRIG 129  CHOLHDL 4.0   Thyroid Function Tests:  Recent Labs  06/20/15 0054 06/20/15 0055  TSH 1.589  --   FREET4  --  1.05   Anemia Panel: No results for input(s): VITAMINB12, FOLATE, FERRITIN, TIBC, IRON, RETICCTPCT in the last 72 hours. Urine analysis:    Component Value Date/Time   COLORURINE YELLOW 06/19/2015 2107   APPEARANCEUR CLEAR 06/19/2015 2107   LABSPEC 1.014 06/19/2015 2107   PHURINE 5.5 06/19/2015 2107   GLUCOSEU NEGATIVE 06/19/2015 2107   GLUCOSEU NEGATIVE 04/09/2014 0910   HGBUR NEGATIVE 06/19/2015 2107   BILIRUBINUR NEGATIVE 06/19/2015 2107   KETONESUR 15* 06/19/2015 2107   PROTEINUR NEGATIVE 06/19/2015 2107   UROBILINOGEN 1.0 04/09/2014 0910   NITRITE NEGATIVE 06/19/2015 2107   LEUKOCYTESUR NEGATIVE 06/19/2015 2107   Sepsis Labs: @LABRCNTIP (procalcitonin:4,lacticidven:4)  )No results found for this or any previous visit (from the past 240 hour(s)).       Radiology Studies: Ct Angio Head W/cm &/or Wo Cm  06/20/2015  CLINICAL DATA:  Cerebellar stroke on MRI.  Dizziness. EXAM: CT ANGIOGRAPHY HEAD AND NECK TECHNIQUE: Multidetector CT imaging of the head and neck was performed using the standard protocol during bolus administration of intravenous contrast. Multiplanar CT image reconstructions and MIPs were obtained to evaluate the vascular anatomy. Carotid stenosis measurements (when applicable) are obtained utilizing NASCET criteria, using the distal internal carotid diameter as the denominator. CONTRAST:  50 mL Isovue 370 COMPARISON:  Brain MRI 06/20/2015.  Soft tissue neck CT 11/09/2011. FINDINGS: CT HEAD Brain: Left cerebellar edema corresponds to the acute/early subacute infarct described on earlier MRI. There is no evidence of acute intracranial hemorrhage, mass, midline shift, or extra-axial fluid collection. Ventricles and sulci are within normal limits for age. Cerebral white matter hypodensities are nonspecific but  compatible with moderate chronic small vessel ischemic disease. Calvarium and skull base: 1.6 cm lytic lesion in the right parietal bone is benign in appearance on MRI. Paranasal sinuses: Subtotal opacification of the right maxillary sinus. Small bilateral mastoid effusions. Orbits: Prior bilateral cataract extraction and bilateral exophthalmos. CTA NECK Aortic arch: 3 vessel aortic arch with mild-to-moderate atherosclerotic plaque. Brachiocephalic artery is widely patent. There is a kinked appearance of the proximal right subclavian artery with calcified plaque eccentrically at the origin. In combination, these findings result in approximately 65% narrowing. Moderate atherosclerotic plaque in the proximal left subclavian artery does not result in significant stenosis. Right carotid system: Widely patent common carotid artery. There is prominent mixed calcified and noncalcified plaque in the proximal ICA which results in moderate luminal irregularity but no significant stenosis. The more distal cervical ICA is unremarkable. Left carotid system: Widely patent common carotid artery with minimal atherosclerotic irregularity. Mild non stenotic calcified plaque at the carotid bifurcation. Otherwise unremarkable cervical ICA. Vertebral arteries: The right vertebral artery is widely patent and dominant with only trace calcified plaque near its origin. Calcified plaque at the left vertebral artery origin results  in moderate stenosis. There is occlusion of the left V2 segment at the C5 level with only intermittent reconstitution of the distal V2 and V3 segments. Skeleton: Moderate to advanced disc and facet degeneration throughout the cervical spine. 3 mm degenerative anterolisthesis of C7 on T1. Other neck: Interlobular septal thickening in the visualized right greater than left lung apices which may reflect mild edema. Partially visualized small right pleural effusion. CTA HEAD Anterior circulation: Intracranial internal  carotid arteries are patent with moderate right and mild left calcified plaque without significant stenosis. ACAs and MCAs are patent with mild branch vessel irregularity but no evidence of major branch occlusion or significant proximal stenosis. No intracranial aneurysm is identified. Posterior circulation: Intracranial right vertebral artery is patent with mild stenosis due to calcified plaque. There is reconstitution of the intracranial left vertebral artery which may reflect retrograde flow. Both PICA is are grossly patent. AICA and SCA origins are patent, with possible stenosis of the right SCA origin. Basilar artery is widely patent. There are patent posterior communicating arteries bilaterally. PCAs are patent without evidence of significant stenosis. Venous sinuses: Patent. Anatomic variants: None. Delayed phase: No abnormal enhancement. IMPRESSION: 1. Acute/early subacute left cerebellar infarct.  No hemorrhage. 2. Proximal left V2 vertebral artery occlusion with intermittent distal reconstitution. 3. Patent and dominant right vertebral artery with mild V4 stenosis. 4. Right greater than left intracranial and extracranial carotid artery atherosclerosis without significant stenosis. Electronically Signed   By: Logan Bores M.D.   On: 06/20/2015 13:16   Dg Chest 2 View  06/19/2015  CLINICAL DATA:  Dizziness and weakness for 1 day. Near syncopal episode. EXAM: CHEST  2 VIEW COMPARISON:  06/17/2015 FINDINGS: Mild cardiomegaly remains stable. Tiny pleural effusions are seen bilaterally. No evidence of pulmonary edema or consolidation. Tortuosity of thoracic aorta remains stable. IMPRESSION: Tiny bilateral pleural effusions. No evidence of pulmonary edema or consolidation. Stable mild cardiomegaly. Electronically Signed   By: Earle Gell M.D.   On: 06/19/2015 18:31   Ct Head Wo Contrast  06/20/2015  CLINICAL DATA:  Cerebellar stroke on MRI.  Dizziness. EXAM: CT ANGIOGRAPHY HEAD AND NECK TECHNIQUE:  Multidetector CT imaging of the head and neck was performed using the standard protocol during bolus administration of intravenous contrast. Multiplanar CT image reconstructions and MIPs were obtained to evaluate the vascular anatomy. Carotid stenosis measurements (when applicable) are obtained utilizing NASCET criteria, using the distal internal carotid diameter as the denominator. CONTRAST:  50 mL Isovue 370 COMPARISON:  Brain MRI 06/20/2015.  Soft tissue neck CT 11/09/2011. FINDINGS: CT HEAD Brain: Left cerebellar edema corresponds to the acute/early subacute infarct described on earlier MRI. There is no evidence of acute intracranial hemorrhage, mass, midline shift, or extra-axial fluid collection. Ventricles and sulci are within normal limits for age. Cerebral white matter hypodensities are nonspecific but compatible with moderate chronic small vessel ischemic disease. Calvarium and skull base: 1.6 cm lytic lesion in the right parietal bone is benign in appearance on MRI. Paranasal sinuses: Subtotal opacification of the right maxillary sinus. Small bilateral mastoid effusions. Orbits: Prior bilateral cataract extraction and bilateral exophthalmos. CTA NECK Aortic arch: 3 vessel aortic arch with mild-to-moderate atherosclerotic plaque. Brachiocephalic artery is widely patent. There is a kinked appearance of the proximal right subclavian artery with calcified plaque eccentrically at the origin. In combination, these findings result in approximately 65% narrowing. Moderate atherosclerotic plaque in the proximal left subclavian artery does not result in significant stenosis. Right carotid system: Widely patent common carotid artery. There  is prominent mixed calcified and noncalcified plaque in the proximal ICA which results in moderate luminal irregularity but no significant stenosis. The more distal cervical ICA is unremarkable. Left carotid system: Widely patent common carotid artery with minimal atherosclerotic  irregularity. Mild non stenotic calcified plaque at the carotid bifurcation. Otherwise unremarkable cervical ICA. Vertebral arteries: The right vertebral artery is widely patent and dominant with only trace calcified plaque near its origin. Calcified plaque at the left vertebral artery origin results in moderate stenosis. There is occlusion of the left V2 segment at the C5 level with only intermittent reconstitution of the distal V2 and V3 segments. Skeleton: Moderate to advanced disc and facet degeneration throughout the cervical spine. 3 mm degenerative anterolisthesis of C7 on T1. Other neck: Interlobular septal thickening in the visualized right greater than left lung apices which may reflect mild edema. Partially visualized small right pleural effusion. CTA HEAD Anterior circulation: Intracranial internal carotid arteries are patent with moderate right and mild left calcified plaque without significant stenosis. ACAs and MCAs are patent with mild branch vessel irregularity but no evidence of major branch occlusion or significant proximal stenosis. No intracranial aneurysm is identified. Posterior circulation: Intracranial right vertebral artery is patent with mild stenosis due to calcified plaque. There is reconstitution of the intracranial left vertebral artery which may reflect retrograde flow. Both PICA is are grossly patent. AICA and SCA origins are patent, with possible stenosis of the right SCA origin. Basilar artery is widely patent. There are patent posterior communicating arteries bilaterally. PCAs are patent without evidence of significant stenosis. Venous sinuses: Patent. Anatomic variants: None. Delayed phase: No abnormal enhancement. IMPRESSION: 1. Acute/early subacute left cerebellar infarct.  No hemorrhage. 2. Proximal left V2 vertebral artery occlusion with intermittent distal reconstitution. 3. Patent and dominant right vertebral artery with mild V4 stenosis. 4. Right greater than left  intracranial and extracranial carotid artery atherosclerosis without significant stenosis. Electronically Signed   By: Logan Bores M.D.   On: 06/20/2015 13:16   Ct Angio Neck W/cm &/or Wo/cm  06/20/2015  CLINICAL DATA:  Cerebellar stroke on MRI.  Dizziness. EXAM: CT ANGIOGRAPHY HEAD AND NECK TECHNIQUE: Multidetector CT imaging of the head and neck was performed using the standard protocol during bolus administration of intravenous contrast. Multiplanar CT image reconstructions and MIPs were obtained to evaluate the vascular anatomy. Carotid stenosis measurements (when applicable) are obtained utilizing NASCET criteria, using the distal internal carotid diameter as the denominator. CONTRAST:  50 mL Isovue 370 COMPARISON:  Brain MRI 06/20/2015.  Soft tissue neck CT 11/09/2011. FINDINGS: CT HEAD Brain: Left cerebellar edema corresponds to the acute/early subacute infarct described on earlier MRI. There is no evidence of acute intracranial hemorrhage, mass, midline shift, or extra-axial fluid collection. Ventricles and sulci are within normal limits for age. Cerebral white matter hypodensities are nonspecific but compatible with moderate chronic small vessel ischemic disease. Calvarium and skull base: 1.6 cm lytic lesion in the right parietal bone is benign in appearance on MRI. Paranasal sinuses: Subtotal opacification of the right maxillary sinus. Small bilateral mastoid effusions. Orbits: Prior bilateral cataract extraction and bilateral exophthalmos. CTA NECK Aortic arch: 3 vessel aortic arch with mild-to-moderate atherosclerotic plaque. Brachiocephalic artery is widely patent. There is a kinked appearance of the proximal right subclavian artery with calcified plaque eccentrically at the origin. In combination, these findings result in approximately 65% narrowing. Moderate atherosclerotic plaque in the proximal left subclavian artery does not result in significant stenosis. Right carotid system: Widely patent  common carotid  artery. There is prominent mixed calcified and noncalcified plaque in the proximal ICA which results in moderate luminal irregularity but no significant stenosis. The more distal cervical ICA is unremarkable. Left carotid system: Widely patent common carotid artery with minimal atherosclerotic irregularity. Mild non stenotic calcified plaque at the carotid bifurcation. Otherwise unremarkable cervical ICA. Vertebral arteries: The right vertebral artery is widely patent and dominant with only trace calcified plaque near its origin. Calcified plaque at the left vertebral artery origin results in moderate stenosis. There is occlusion of the left V2 segment at the C5 level with only intermittent reconstitution of the distal V2 and V3 segments. Skeleton: Moderate to advanced disc and facet degeneration throughout the cervical spine. 3 mm degenerative anterolisthesis of C7 on T1. Other neck: Interlobular septal thickening in the visualized right greater than left lung apices which may reflect mild edema. Partially visualized small right pleural effusion. CTA HEAD Anterior circulation: Intracranial internal carotid arteries are patent with moderate right and mild left calcified plaque without significant stenosis. ACAs and MCAs are patent with mild branch vessel irregularity but no evidence of major branch occlusion or significant proximal stenosis. No intracranial aneurysm is identified. Posterior circulation: Intracranial right vertebral artery is patent with mild stenosis due to calcified plaque. There is reconstitution of the intracranial left vertebral artery which may reflect retrograde flow. Both PICA is are grossly patent. AICA and SCA origins are patent, with possible stenosis of the right SCA origin. Basilar artery is widely patent. There are patent posterior communicating arteries bilaterally. PCAs are patent without evidence of significant stenosis. Venous sinuses: Patent. Anatomic variants: None.  Delayed phase: No abnormal enhancement. IMPRESSION: 1. Acute/early subacute left cerebellar infarct.  No hemorrhage. 2. Proximal left V2 vertebral artery occlusion with intermittent distal reconstitution. 3. Patent and dominant right vertebral artery with mild V4 stenosis. 4. Right greater than left intracranial and extracranial carotid artery atherosclerosis without significant stenosis. Electronically Signed   By: Logan Bores M.D.   On: 06/20/2015 13:16   Mr Jeri Cos [id number] Contrast  06/20/2015  CLINICAL DATA:  Acute onset dizziness for 3 days, unsteady gait. Symptoms improved with IV fluids. On Coumadin for pulmonary embolism. History of leukemia, hypertension, diabetes. EXAM: MRI HEAD WITHOUT AND WITH CONTRAST TECHNIQUE: Multiplanar, multiecho pulse sequences of the brain and surrounding structures were obtained without and with intravenous contrast. CONTRAST:  84mL MULTIHANCE GADOBENATE DIMEGLUMINE 529 MG/ML IV SOLN COMPARISON:  None. FINDINGS: INTRACRANIAL CONTENTS: Wedge-like 32 x 38 mm reduced diffusion LEFT inferior cerebellum with intermediate to low ADC values. Subcentimeter linear reduced diffusion inferior vermis. Associated FLAIR T2 hyperintense signal. No susceptibility artifact to suggest hemorrhage. Ventricles and sulci are normal for patient's age. Patchy to confluent supratentorial white matter FLAIR T2 hyperintensities. No midline shift, mass effect, masses nor abnormal intracranial enhancement. Postcontrast sequences are moderately motion degraded without convincing evidence of abnormal intracranial enhancement. No abnormal extra-axial fluid collections. Loss of LEFT vertebral artery flow void and occluded LEFT V4 segment. ORBITS: The included ocular globes and orbital contents are non-suspicious. Status post bilateral ocular lens implants. SINUSES: Severe chronic RIGHT maxillary sinusitis without paranasal sinus air-fluid levels. Trace bilateral mastoid effusions. SKULL/SOFT TISSUES: No abnormal  sellar expansion. No suspicious calvarial bone marrow signal. Craniocervical junction maintained. 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 Moderate chronic small vessel ischemic disease. No convincing evidence of abnormal intracranial enhancement on moderately motion degraded post gadolinium sequences. Acute findings discussed  with and reconfirmed by Dr.ADELEKE ONI on 06/20/2015 at 5:16 am. Electronically Signed   By: Elon Alas M.D.   On: 06/20/2015 05:17        Scheduled Meds: Continuous Infusions:   LOS: 1 day    Time spent: 35 Mins    Bonnell Public, MD [facility] Pager [phone]  If 7PM-7AM, please contact night-coverage [email or web address] Password [facility] 06/21/2015, 4:47 PM

## 2015-06-21 NOTE — Progress Notes (Signed)
ANTICOAGULATION CONSULT NOTE - Follow Up Consult  Pharmacy Consult for Warfarin>>>>Apixaban Indication: Hx DVT/PE and stroke  Patient Measurements: Height:  (5' 5.5') Weight: 190 lb 0.6 oz (86.2 kg) (from 05/13/15)  Labs:  Recent Labs  06/19/15 1750 06/21/15 0352  HGB 11.2*  --   HCT 34.7*  --   PLT 159  --   LABPROT 22.9* 18.7*  INR 2.03* 1.56*  CREATININE 1.14  --     Estimated Creatinine Clearance: 55.3 mL/min (by C-G formula based on Cr of 1.14).   Assessment: INR is <2 this AM, ok to start apixaban  Goal of Therapy:  Monitor platelets by anticoagulation protocol: Yes   Plan:  -Apixaban 5 mg BID -Minimum q72h CBC while inpatient -Monitor for bleeding  Narda Bonds 06/21/2015,5:50 AM

## 2015-06-21 NOTE — Discharge Instructions (Signed)
Information on my medicine - ELIQUIS (apixaban)  This medication education was reviewed with me or my healthcare representative as part of my discharge preparation.  The pharmacist that spoke with me during my hospital stay was:  Arman Bogus, [facility]  Why was Eliquis prescribed for you? Eliquis was prescribed for you to reduce the risk of a blood clot forming that can cause a stroke if you have a history of pulmonary embolus and new CVA (06/2014)  What do You need to know about Eliquis ? Take your Eliquis TWICE DAILY - one tablet in the morning and one tablet in the evening with or without food. If you have difficulty swallowing the tablet whole please discuss with your pharmacist how to take the medication safely.  Take Eliquis exactly as prescribed by your doctor and DO NOT stop taking Eliquis without talking to the doctor who prescribed the medication.  Stopping may increase your risk of developing a stroke.  Refill your prescription before you run out.  After discharge, you should have regular check-up appointments with your healthcare provider that is prescribing your Eliquis.  In the future your dose may need to be changed if your kidney function or weight changes by a significant amount or as you get older.  What do you do if you miss a dose? If you miss a dose, take it as soon as you remember on the same day and resume taking twice daily.  Do not take more than one dose of ELIQUIS at the same time to make up a missed dose.  Important Safety Information A possible side effect of Eliquis is bleeding. You should call your healthcare provider right away if you experience any of the following: ? Bleeding from an injury or your nose that does not stop. ? Unusual colored urine (red or dark brown) or unusual colored stools (red or black). ? Unusual bruising for unknown reasons. ? A serious fall or if you hit your head (even if there is no bleeding).  Some medicines may interact  with Eliquis and might increase your risk of bleeding or clotting while on Eliquis. To help avoid this, consult your healthcare provider or pharmacist prior to using any new prescription or non-prescription medications, including herbals, vitamins, non-steroidal anti-inflammatory drugs (NSAIDs) and supplements.  This website has more information on Eliquis (apixaban): [email or web address]://[email or web address]

## 2015-06-21 NOTE — Progress Notes (Signed)
STROKE TEAM PROGRESS NOTE   SUBJECTIVE (INTERVAL HISTORY) The patient's wife and a friend were at the bedside. The patient reports it is still feeling dizziness on standing, and unstable gait. Pending PT/OT. Will do CT head in am to rule out hydrocephalus.   OBJECTIVE Temp:  [97.4 F (36.3 C)-98.7 F (37.1 C)] 98.6 F (37 C) (05/12 1039) Pulse Rate:  [63-75] 63 (05/12 1039) Cardiac Rhythm:  [-] Normal sinus rhythm (05/12 0805) Resp:  [16-20] 18 (05/12 1039) BP: (116-164)/(62-78) 121/67 mmHg (05/12 1039) SpO2:  [95 %-100 %] 95 % (05/12 1039) Weight:  [86.2 kg (190 lb 0.6 oz)] 86.2 kg (190 lb 0.6 oz) (05/11 1500)  CBC:   Recent Labs Lab 06/19/15 1750  WBC 9.7  HGB 11.2*  HCT 34.7*  MCV 94.8  PLT [id number]    Basic Metabolic Panel:   Recent Labs Lab 06/19/15 1750  NA 139  K 3.6  CL 109  CO2 23  GLUCOSE 117*  BUN 6  CREATININE 1.14  CALCIUM 8.5*    Lipid Panel:     Component Value Date/Time   CHOL 104 06/21/2015 0352   TRIG 129 06/21/2015 0352   HDL 26* 06/21/2015 0352   CHOLHDL 4.0 06/21/2015 0352   VLDL 26 06/21/2015 0352   LDLCALC 52 06/21/2015 0352   HgbA1c:  Lab Results  Component Value Date   HGBA1C 6.0* 03/07/2015   Urine Drug Screen: No results found for: LABOPIA, COCAINSCRNUR, LABBENZ, AMPHETMU, THCU, LABBARB    IMAGING  Dg Chest 2 View 06/19/2015   Tiny bilateral pleural effusions. No evidence of pulmonary edema or consolidation. Stable mild cardiomegaly.    [name] Contrast 06/20/2015   Acute to early subacute nonhemorrhagic LEFT posterior inferior cerebellar artery territory infarct. LEFT vertebral artery occlusion (less likely slow flow). Moderate chronic small vessel ischemic disease. No convincing evidence of abnormal intracranial enhancement on moderately motion degraded post gadolinium sequences.    CTA head and neck - 1. Acute/early subacute left cerebellar infarct. No hemorrhage. 2. Proximal left V2 vertebral artery occlusion  with intermittent distal reconstitution. 3. Patent and dominant right vertebral artery with mild V4 stenosis. 4. Right greater than left intracranial and extracranial carotid artery atherosclerosis without significant stenosis.   TTE  Left ventricle: The cavity size was normal. Wall thickness was  increased in a pattern of mild LVH. There was mild focal basal  hypertrophy of the septum. Systolic function was normal. The  estimated ejection fraction was in the range of 55% to 60%. Wall  motion was normal; there were no regional wall motion  abnormalities. Doppler parameters are consistent with abnormal  left ventricular relaxation (grade 1 diastolic dysfunction).  Doppler parameters are consistent with high ventricular filling  pressure. - Left atrium: The atrium was mildly dilated. Impressions: - Normal LV systolic function; grade 1 diastolic dysfunction;  elevated LV filling pressure; mild LVH; mild LAE.    PHYSICAL EXAM  Temp:  [97.4 F (36.3 C)-98.7 F (37.1 C)] 98.6 F (37 C) (05/12 1039) Pulse Rate:  [63-75] 63 (05/12 1039) Resp:  [16-20] 18 (05/12 1039) BP: (116-164)/(62-78) 121/67 mmHg (05/12 1039) SpO2:  [95 %-100 %] 95 % (05/12 1039) Weight:  [86.2 kg (190 lb 0.6 oz)] 86.2 kg (190 lb 0.6 oz) (05/11 1500)  General - Well nourished, well developed, in no apparent distress.  Ophthalmologic - Fundi not visualized due to noncooperation.  Cardiovascular - irregular heart rhythm, tele showed MAT.  Mental Status -  Level of arousal  and orientation to time, place, and person were intact. Language including expression, naming, repetition, comprehension was assessed and found intact. Fund of Knowledge was assessed and was intact.  Cranial Nerves II - XII - II - Visual field intact OU. III, IV, VI - Extraocular movements intact. V - Facial sensation intact bilaterally. VII - Facial movement intact bilaterally. VIII - Hearing & vestibular intact bilaterally,  no nystagmus. X - Palate elevates symmetrically. XI - Chin turning & shoulder shrug intact bilaterally. XII - Tongue protrusion intact.  Motor Strength - The patient's strength was normal in all extremities and pronator drift was absent.  Bulk was normal and fasciculations were absent.   Motor Tone - Muscle tone was assessed at the neck and appendages and was normal.  Reflexes - The patient's reflexes were 1+ in all extremities and he had no pathological reflexes.  Sensory - Light touch, temperature/pinprick were assessed and were symmetrical.    Coordination - The patient had normal movements in the hands and feet with no ataxia or dysmetria.  Tremor was absent.  Gait and Station - not tested due to safety concerns.   ASSESSMENT/PLAN [name] is a 75 y.o. 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 Etiology could be cardioembolic as pt has MAT on exam with potential afib vs. 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  Patient counseled to be compliant with his antithrombotic medications  Ongoing aggressive stroke risk factor management  Therapy recommendations:  pending   Disposition:  pending  (lives w/  wife)  Has seen Dr. Jannifer Franklin in the past. Follow up with him after discharge.  Hx of DVT and PE  On long term coumadin treatment  INR 2.03 on admission  INR now 1.56  The patient is now on Eliquis.  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 Jannifer Franklin in 2 months after discharge.  Mikey Bussing PA-C [facility] Pager [phone] 06/21/2015, 2:00 PM  I, the attending vascular neurologist, have personally obtained a history, examined the patient, evaluated laboratory data, individually viewed imaging studies and agree with radiology interpretations. I obtained additional history from pt's wife at bedside. I also discussed with Dr. Marthenia Rolling regarding his care plan. Together with  the NP/PA, we formulated the assessment and plan of care which reflects our mutual decision.  I have made any additions or clarifications directly to the above note and agree with the findings and plan as currently documented.   Neuro stable. Pending PT/OT. Repeat CT head in am to rule out hydrocephalus. On Eliquis. Follow up with Dr. Jannifer Franklin in 2 months after discharge.  Rosalin Hawking, MD PhD Stroke Neurology 06/21/2015 4:15 PM       To contact Stroke Continuity provider, please refer to [email or web address] After hours, contact General Neurology

## 2015-06-22 LAB — HEMOGLOBIN A1C
HEMOGLOBIN A1C: 5.7 % — AB (ref 4.8–5.6)
MEAN PLASMA GLUCOSE: 117 mg/dL

## 2015-06-22 MED ORDER — LISINOPRIL 10 MG PO TABS
10.0000 mg | ORAL_TABLET | Freq: Every day | ORAL | Status: DC
  Administered 2015-06-22 – 2015-06-24 (×3): 10 mg via ORAL
  Filled 2015-06-22 (×3): qty 1

## 2015-06-22 NOTE — Evaluation (Addendum)
Occupational Therapy Evaluation Patient Details Name: [name] MRN: [id number] DOB: 1940-02-20 Today's Date: 06/22/2015    History of Present Illness Patient is a 75 y.o. male with hx of DVT, CML, DM, HTN and carotid artery disease, osteoporosis, chronic edema, HOH, mixed HLD, diverticulosis, myasthenia gravis, GERD, PE, DJD, ptosis, dystonia, back surgeries, left shoulder surgery who presented with vertigo and unsteady gait. MRI-left PICA infarct, left vertebral artery occlusion.    Clinical Impression   Pt admitted with above. Pt independent with ADLs, PTA (prior to recent episode). Feel pt will benefit from acute OT to increase independence and address bilateral UEs prior to d/c. Recommending CIR and feel pt is a great candidate.  Follow Up Recommendations  CIR    Equipment Recommendations  Other (comment) (defer to next venue)    Recommendations for Other Services       Precautions / Restrictions Precautions Precautions: Fall Precaution Comments: vertigo Restrictions Weight Bearing Restrictions: No      Mobility Bed Mobility Overal bed mobility: Needs Assistance Bed Mobility: Supine to Sit;Sit to Supine     Supine to sit: Supervision Sit to supine: Supervision    Transfers Overall transfer level: Needs assistance Transfers: Sit to/from Stand Sit to Stand: Min guard         General transfer comment: RW in front of pt; cue for hand placement.    Balance  Unsteady with ambulation-assist given. Unsteady sitting EOB during functional task.                    ADL Overall ADL's : Needs assistance/impaired                     Lower Body Dressing: Minimal assistance;Sit to/from stand   Toilet Transfer: Minimal assistance;Ambulation;RW (sit to stand from bed)           Functional mobility during ADLs: Minimal assistance;Rolling walker General ADL Comments: Encouraged pt to look at one thing to help with dizziness.      Vision   Reports blurry vision.   Perception     Praxis      Pertinent Vitals/Pain Pain Assessment: 0-10 Pain Score:  (2-3) Pain Location: head Pain Descriptors / Indicators: Headache Pain Intervention(s): Monitored during session     Hand Dominance Right   Extremity/Trunk Assessment Upper Extremity Assessment Upper Extremity Assessment: RUE deficits/detail;LUE deficits/detail RUE Deficits / Details: weakness in shoulder flexors RUE Coordination: decreased fine motor;decreased gross motor LUE Deficits / Details: weakness in shoulder flexors (left weaker than right); history of shoulder surgery; weak grip strength LUE Coordination: decreased fine motor;decreased gross motor   Lower Extremity Assessment Lower Extremity Assessment: Defer to PT evaluation RLE Sensation:  [facility]) RLE Coordination: decreased fine motor LLE Deficits / Details: Difficulty performing heel to shin LLE Sensation:  (WFL) LLE Coordination: decreased fine motor       Communication Communication Communication: HOH   Cognition Arousal/Alertness: Awake/alert Behavior During Therapy: WFL for tasks assessed/performed Overall Cognitive Status: Within Functional Limits for tasks assessed                     General Comments       Exercises Exercises: Other exercises Other Exercises Other Exercises: educated on activities he can be doing to work on fine motor coordination.    Shoulder Instructions      Home Living Family/patient expects to be discharged to:: Inpatient rehab Living Arrangements: Spouse/significant other Available Help at Discharge: Family;Available 24 hours/day  Type of Home: House Home Access: Stairs to enter [employer] of Steps: 3 Entrance Stairs-Rails: Right Home Layout: Two level;Bed/bath upstairs Alternate Level Stairs-Number of Steps: 9 steps; 1 flight to get to basement Alternate Level Stairs-Rails: Right;Left           Home Equipment: Shower seat - built  in   Additional Comments: pt has walk-in shower       Prior Functioning/Environment Level of Independence: Independent (prior to this recent episode)        Comments: very active, likes to garden    OT Diagnosis: Generalized weakness;Other (comment) (decreased coordination)   OT Problem List: Decreased strength;Decreased activity tolerance;Impaired balance (sitting and/or standing);Decreased coordination;Impaired vision/perception;Decreased knowledge of use of DME or AE;Impaired UE functional use;Pain   OT Treatment/Interventions: Self-care/ADL training;Therapeutic exercise;DME and/or AE instruction;Therapeutic activities;Visual/perceptual remediation/compensation;Patient/family education;Balance training    OT Goals(Current goals can be found in the care plan section) Acute Rehab OT Goals Patient Stated Goal: to get back like I was OT Goal Formulation: With patient Time For Goal Achievement: 06/29/15 Potential to Achieve Goals: Good ADL Goals Pt Will Perform Grooming: with set-up;with supervision;standing Pt Will Perform Lower Body Bathing: sit to/from stand;with set-up;with supervision Pt Will Perform Lower Body Dressing: with set-up;with supervision;sit to/from stand Pt Will Transfer to Toilet: ambulating;with min guard assist;regular height toilet Pt Will Perform Toileting - Clothing Manipulation and hygiene: with min guard assist;sit to/from stand Additional ADL Goal #1: Pt will independently perform HEP for bilateral UEs to increase strength and coordination.  OT Frequency: Min 2X/week   Barriers to D/C:            Co-evaluation              End of Session Equipment Utilized During Treatment: Gait belt;Rolling walker  Activity Tolerance: Patient limited by fatigue; patient tolerated treatment well Patient left: in bed;with family/visitor present;with bed alarm set   Time: 1031-1050 OT Time Calculation (min): 19 min Charges:  OT General Charges $OT Visit: 1  Procedure OT Evaluation $OT Eval Moderate Complexity: 1 Procedure G-CodesBenito Mccreedy OTR/L [id number] 06/22/2015, 11:08 AM

## 2015-06-22 NOTE — Evaluation (Signed)
Physical Therapy Evaluation Patient Details Name: [name] MRN: [id number] DOB: 10-May-1940 Today's Date: 06/22/2015   History of Present Illness  Patient is a 75 y/o male with hx of DVT, DM, CML, HTN and CAD presents with vertigo and unsteady gait. MRI-left PICA infarct, left vertebral artery occlusion.   Clinical Impression  Patient presents with impaired balance and incoordination BUE/LEs, Lft>Rt s/p CVA impacting safe mobility and putting pt at increased risk for falls. Tolerated gait training but required Mod A for balance/cues for gaze stabilization. Not safe to return home at this time. HIGH fall risk. Highly motivated to return to functional independence. Discussed disposition and CIR. Would benefit from CIR to maximize independence and mobility prior to return home.    Follow Up Recommendations CIR    Equipment Recommendations  Other (comment) (TBA)    Recommendations for Other Services Rehab consult     Precautions / Restrictions Precautions Precautions: Fall Precaution Comments: vertigo Restrictions Weight Bearing Restrictions: No      Mobility  Bed Mobility Overal bed mobility: Needs Assistance Bed Mobility: Supine to Sit     Supine to sit: Min guard;HOB elevated     General bed mobility comments: Use of rail. + dizziness getting to EOB. no assist needed. Slow.  Transfers Overall transfer level: Needs assistance Equipment used: Rolling walker (2 wheeled) Transfers: Sit to/from Stand Sit to Stand: Min assist         General transfer comment: Min A to steady in standing due to sway and jerking movements noted in BUEs/LEs. Assisted pt with transfer to [facility] with Min A with lean right at times.   Ambulation/Gait Ambulation/Gait assistance: Mod assist Ambulation Distance (Feet): 120 Feet Assistive device: Rolling walker (2 wheeled) Gait Pattern/deviations: Step-through pattern;Step-to pattern;Decreased stride length;Trunk flexed;Drifts right/left Gait  velocity: decreased   General Gait Details: Slow, jerking like gait movement with mutiple standing rest breaks due to dizziness. Cues for gaze stabilization during mobility and focusing on stagnant objects and turning with head on shoulders to avoid head turns. Mod A for balance/cues.  Stairs            Wheelchair Mobility    Modified Rankin (Stroke Patients Only) Modified Rankin (Stroke Patients Only) Pre-Morbid Rankin Score: No symptoms Modified Rankin: Moderately severe disability     Balance Overall balance assessment: Needs assistance Sitting-balance support: Feet supported;No upper extremity supported Sitting balance-Leahy Scale: Fair Sitting balance - Comments: Able to perform testing of AROM BLEs sitting EOB with posterior lean but no LOB.   Standing balance support: During functional activity Standing balance-Leahy Scale: Poor Standing balance comment: Reliant on BUEs for support. Min-Mod A for dynamic standing.                             Pertinent Vitals/Pain Pain Assessment: No/denies pain    Home Living Family/patient expects to be discharged to:: Private residence Living Arrangements: Spouse/significant other Available Help at Discharge: Family;Available 24 hours/day Type of Home: House Home Access: Stairs to enter Entrance Stairs-Rails: Right Entrance Stairs-Number of Steps: 3 Home Layout: Two level;Bed/bath upstairs Home Equipment: None      Prior Function Level of Independence: Independent         Comments: very active, likes to garden     Hand Dominance        Extremity/Trunk Assessment   Upper Extremity Assessment: Defer to OT evaluation (Incoordination BUEs Lft>Rt as demonstrated by finger to thumb and finger to  nose. Very slow.)           Lower Extremity Assessment: RLE deficits/detail;LLE deficits/detail   LLE Deficits / Details: Difficulty performing heel to shin     Communication   Communication: No  difficulties  Cognition Arousal/Alertness: Awake/alert Behavior During Therapy: WFL for tasks assessed/performed Overall Cognitive Status: Within Functional Limits for tasks assessed                      General Comments General comments (skin integrity, edema, etc.): Wife present during session.    Exercises        Assessment/Plan    PT Assessment Patient needs continued PT services  PT Diagnosis Difficulty walking;Abnormality of gait   PT Problem List Decreased coordination;Decreased activity tolerance;Decreased mobility;Decreased balance  PT Treatment Interventions Balance training;Gait training;Functional mobility training;Therapeutic activities;Therapeutic exercise;Patient/family education;Stair training;Neuromuscular re-education   PT Goals (Current goals can be found in the Care Plan section) Acute Rehab PT Goals Patient Stated Goal: to return to normal and go home PT Goal Formulation: With patient/family Time For Goal Achievement: 07/06/15 Potential to Achieve Goals: Good    Frequency Min 4X/week   Barriers to discharge Decreased caregiver support;Inaccessible home environment stairs at home and wife will not be able to care for him at this level.    Co-evaluation               End of Session Equipment Utilized During Treatment: Gait belt Activity Tolerance: Patient tolerated treatment well Patient left: Other (comment);with family/visitor present (on [facility]- RN notified. Instructed pt to call for help when ready.) Nurse Communication: Mobility status;Other (comment) (need for CIR consult)         Time: [id number] PT Time Calculation (min) (ACUTE ONLY): 27 min   Charges:   PT Evaluation $PT Eval Moderate Complexity: 1 Procedure PT Treatments $Gait Training: 8-22 mins   PT G Codes:        Kwamane Whack A Ashton Belote 06/22/2015, 9:50 AM Wray Kearns, PT, DPT [phone]

## 2015-06-22 NOTE — Progress Notes (Signed)
Patients IV in RAC, swollen, firm to the touch, removed, parameters marked and heat applied to site. RN will continue to monitor.

## 2015-06-22 NOTE — Progress Notes (Signed)
PROGRESS NOTE    [name]  [id number] DOB: 1940-10-08 DOA: 06/19/2015 PCP: Renato Shin, MD   Outpatient Specialists:    Brief Narrative: 75 y.o. 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 3 days ago, the patient was getting ready for lunch with his wife, when he had abrupt onset of vertigo and imbalance. He also had diaphoresis, lightheadedness, weakness, and vomiting, and so his wife called 9-1-1, and went to the hospital in [place]. There he was diagnosed with dehydration, and observed for 2 days, discharged Wednesday. As soon as he got home, he had inability to walk, continued vertigo and nausea, and so his wife brought him to [facility]. 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 Acute cerebellar stroke: PT input is appreciated. For inpatient rehab (Rehab consulted). Continue current treatment. 2. Myasthenia gravis: Stable. 3. Coronary disease and chronic diastolic CHF: Stable. 4. CML: Continue home [place] 5. Hx of PE: [place] as above. 6. NIDDM: Optimize.    DVT prophylaxis: Anticoagulated Code Status: Full Family Communication: Wife and daughter. Disposition Plan: May need rehab Consultants:   Neurology   Subjective: Nil new complaints. Still having vertigo.  Objective: Filed Vitals:   06/21/15 1816 06/21/15 2151 06/22/15 0138 06/22/15 0559  BP: 147/64 151/71 148/68 184/87  Pulse: 73 68 64 69  Temp: 98.4 F (36.9 C)  98.6 F (37 C) 98.4 F (36.9 C) 98.2 F (36.8 C)  TempSrc: Oral Oral Oral Oral  Resp: 18 18 18 20   Weight:      SpO2: 97% 100% 100% 96%   No intake or output data in the 24 hours ending 06/22/15 0804 Filed Weights   06/20/15 1500  Weight: 86.2 kg (190 lb 0.6 oz)    Examination:  General exam: Appears calm and comfortable  Respiratory system: Clear to auscultation.  Cardiovascular system: S1 & S2. Gastrointestinal system: Abdomen is obese, soft and nontender. No organomegaly or masses felt. Normal bowel sounds heard. Central nervous system: Alert and oriented. Gait imbalance Extremities: No leg edema.  Data Reviewed: I have personally reviewed following labs and imaging studies  CBC:  Recent Labs Lab 06/19/15 1750  WBC 9.7  HGB 11.2*  HCT 34.7*  MCV 94.8  PLT [id number]   Basic Metabolic Panel:  Recent Labs Lab 06/19/15 1750  NA 139  K 3.6  CL 109  CO2 23  GLUCOSE 117*  BUN 6  CREATININE 1.14  CALCIUM 8.5*   GFR: Estimated Creatinine Clearance: 55.3 mL/min (by C-G formula based on Cr of 1.14). Liver Function Tests: No results for input(s): AST, ALT, ALKPHOS, BILITOT, PROT, ALBUMIN in the last 168 hours. No results for input(s): LIPASE, AMYLASE in the last 168 hours. No results for input(s): AMMONIA in the last 168 hours. Coagulation Profile:  Recent Labs Lab 06/19/15 1750 06/21/15 0352  INR 2.03* 1.56*   Cardiac Enzymes: No results for input(s): CKTOTAL, CKMB, CKMBINDEX, TROPONINI in the last 168 hours. BNP (last 3 results)  Recent  Labs  03/13/15 1534  PROBNP 58.0   HbA1C:  Recent Labs  06/21/15 0352  HGBA1C 5.7*   CBG: No results for input(s): GLUCAP in the last 168 hours. Lipid Profile:  Recent Labs  06/21/15 0352  CHOL 104  HDL 26*  LDLCALC 52  TRIG 129  CHOLHDL 4.0   Thyroid Function Tests:  Recent Labs  06/20/15 0054 06/20/15 0055  TSH 1.589  --   FREET4  --  1.05   Anemia Panel: No results for input(s): VITAMINB12,  FOLATE, FERRITIN, TIBC, IRON, RETICCTPCT in the last 72 hours. Urine analysis:    Component Value Date/Time   COLORURINE YELLOW 06/19/2015 2107   APPEARANCEUR CLEAR 06/19/2015 2107   LABSPEC 1.014 06/19/2015 2107   PHURINE 5.5 06/19/2015 2107   GLUCOSEU NEGATIVE 06/19/2015 2107   GLUCOSEU NEGATIVE 04/09/2014 0910   HGBUR NEGATIVE 06/19/2015 2107   BILIRUBINUR NEGATIVE 06/19/2015 2107   KETONESUR 15* 06/19/2015 2107   PROTEINUR NEGATIVE 06/19/2015 2107   UROBILINOGEN 1.0 04/09/2014 0910   NITRITE NEGATIVE 06/19/2015 2107   LEUKOCYTESUR NEGATIVE 06/19/2015 2107   Sepsis Labs: @LABRCNTIP (procalcitonin:4,lacticidven:4)  )No results found for this or any previous visit (from the past 240 hour(s)).       Radiology Studies: Ct Angio Head W/cm &/or Wo Cm  06/20/2015  CLINICAL DATA:  Cerebellar stroke on MRI.  Dizziness. EXAM: CT ANGIOGRAPHY HEAD AND NECK TECHNIQUE: Multidetector CT imaging of the head and neck was performed using the standard protocol during bolus administration of intravenous contrast. Multiplanar CT image reconstructions and MIPs were obtained to evaluate the vascular anatomy. Carotid stenosis measurements (when applicable) are obtained utilizing NASCET criteria, using the distal internal carotid diameter as the denominator. CONTRAST:  50 mL Isovue 370 COMPARISON:  Brain MRI 06/20/2015.  Soft tissue neck CT 11/09/2011. FINDINGS: CT HEAD Brain: Left cerebellar edema corresponds to the acute/early subacute infarct described on earlier MRI. There is no evidence of acute intracranial hemorrhage, mass, midline shift, or extra-axial fluid collection. Ventricles and sulci are within normal limits for age. Cerebral white matter hypodensities are nonspecific but compatible with moderate chronic small vessel ischemic disease. Calvarium and skull base: 1.6 cm lytic lesion in the right parietal bone is benign in appearance on MRI. Paranasal sinuses: Subtotal opacification of the right  maxillary sinus. Small bilateral mastoid effusions. Orbits: Prior bilateral cataract extraction and bilateral exophthalmos. CTA NECK Aortic arch: 3 vessel aortic arch with mild-to-moderate atherosclerotic plaque. Brachiocephalic artery is widely patent. There is a kinked appearance of the proximal right subclavian artery with calcified plaque eccentrically at the origin. In combination, these findings result in approximately 65% narrowing. Moderate atherosclerotic plaque in the proximal left subclavian artery does not result in significant stenosis. Right carotid system: Widely patent common carotid artery. There is prominent mixed calcified and noncalcified plaque in the proximal ICA which results in moderate luminal irregularity but no significant stenosis. The more distal cervical ICA is unremarkable. Left carotid system: Widely patent common carotid artery with minimal atherosclerotic irregularity. Mild non stenotic calcified plaque at the carotid bifurcation. Otherwise unremarkable cervical ICA. Vertebral arteries: The right vertebral artery is widely patent and dominant with only trace calcified plaque near its origin. Calcified plaque at the left vertebral artery origin results in moderate stenosis. There is occlusion of the left V2 segment at the C5 level with only intermittent reconstitution of the distal V2 and V3 segments. Skeleton: Moderate to advanced disc and facet degeneration throughout the cervical spine. 3 mm degenerative anterolisthesis of C7 on  T1. Other neck: Interlobular septal thickening in the visualized right greater than left lung apices which may reflect mild edema. Partially visualized small right pleural effusion. CTA HEAD Anterior circulation: Intracranial internal carotid arteries are patent with moderate right and mild left calcified plaque without significant stenosis. ACAs and MCAs are patent with mild branch vessel irregularity but no evidence of major branch occlusion or  significant proximal stenosis. No intracranial aneurysm is identified. Posterior circulation: Intracranial right vertebral artery is patent with mild stenosis due to calcified plaque. There is reconstitution of the intracranial left vertebral artery which may reflect retrograde flow. Both PICA is are grossly patent. AICA and SCA origins are patent, with possible stenosis of the right SCA origin. Basilar artery is widely patent. There are patent posterior communicating arteries bilaterally. PCAs are patent without evidence of significant stenosis. Venous sinuses: Patent. Anatomic variants: None. Delayed phase: No abnormal enhancement. IMPRESSION: 1. Acute/early subacute left cerebellar infarct.  No hemorrhage. 2. Proximal left V2 vertebral artery occlusion with intermittent distal reconstitution. 3. Patent and dominant right vertebral artery with mild V4 stenosis. 4. Right greater than left intracranial and extracranial carotid artery atherosclerosis without significant stenosis. Electronically Signed   By: Logan Bores M.D.   On: 06/20/2015 13:16   Ct Head Wo Contrast  06/20/2015  CLINICAL DATA:  Cerebellar stroke on MRI.  Dizziness. EXAM: CT ANGIOGRAPHY HEAD AND NECK TECHNIQUE: Multidetector CT imaging of the head and neck was performed using the standard protocol during bolus administration of intravenous contrast. Multiplanar CT image reconstructions and MIPs were obtained to evaluate the vascular anatomy. Carotid stenosis measurements (when applicable) are obtained utilizing NASCET criteria, using the distal internal carotid diameter as the denominator. CONTRAST:  50 mL Isovue 370 COMPARISON:  Brain MRI 06/20/2015.  Soft tissue neck CT 11/09/2011. FINDINGS: CT HEAD Brain: Left cerebellar edema corresponds to the acute/early subacute infarct described on earlier MRI. There is no evidence of acute intracranial hemorrhage, mass, midline shift, or extra-axial fluid collection. Ventricles and sulci are within  normal limits for age. Cerebral white matter hypodensities are nonspecific but compatible with moderate chronic small vessel ischemic disease. Calvarium and skull base: 1.6 cm lytic lesion in the right parietal bone is benign in appearance on MRI. Paranasal sinuses: Subtotal opacification of the right maxillary sinus. Small bilateral mastoid effusions. Orbits: Prior bilateral cataract extraction and bilateral exophthalmos. CTA NECK Aortic arch: 3 vessel aortic arch with mild-to-moderate atherosclerotic plaque. Brachiocephalic artery is widely patent. There is a kinked appearance of the proximal right subclavian artery with calcified plaque eccentrically at the origin. In combination, these findings result in approximately 65% narrowing. Moderate atherosclerotic plaque in the proximal left subclavian artery does not result in significant stenosis. Right carotid system: Widely patent common carotid artery. There is prominent mixed calcified and noncalcified plaque in the proximal ICA which results in moderate luminal irregularity but no significant stenosis. The more distal cervical ICA is unremarkable. Left carotid system: Widely patent common carotid artery with minimal atherosclerotic irregularity. Mild non stenotic calcified plaque at the carotid bifurcation. Otherwise unremarkable cervical ICA. Vertebral arteries: The right vertebral artery is widely patent and dominant with only trace calcified plaque near its origin. Calcified plaque at the left vertebral artery origin results in moderate stenosis. There is occlusion of the left V2 segment at the C5 level with only intermittent reconstitution of the distal V2 and V3 segments. Skeleton: Moderate to advanced disc and facet degeneration throughout the cervical spine. 3 mm degenerative anterolisthesis of C7 on T1.  Other neck: Interlobular septal thickening in the visualized right greater than left lung apices which may reflect mild edema. Partially visualized small  right pleural effusion. CTA HEAD Anterior circulation: Intracranial internal carotid arteries are patent with moderate right and mild left calcified plaque without significant stenosis. ACAs and MCAs are patent with mild branch vessel irregularity but no evidence of major branch occlusion or significant proximal stenosis. No intracranial aneurysm is identified. Posterior circulation: Intracranial right vertebral artery is patent with mild stenosis due to calcified plaque. There is reconstitution of the intracranial left vertebral artery which may reflect retrograde flow. Both PICA is are grossly patent. AICA and SCA origins are patent, with possible stenosis of the right SCA origin. Basilar artery is widely patent. There are patent posterior communicating arteries bilaterally. PCAs are patent without evidence of significant stenosis. Venous sinuses: Patent. Anatomic variants: None. Delayed phase: No abnormal enhancement. IMPRESSION: 1. Acute/early subacute left cerebellar infarct.  No hemorrhage. 2. Proximal left V2 vertebral artery occlusion with intermittent distal reconstitution. 3. Patent and dominant right vertebral artery with mild V4 stenosis. 4. Right greater than left intracranial and extracranial carotid artery atherosclerosis without significant stenosis. Electronically Signed   By: Logan Bores M.D.   On: 06/20/2015 13:16   Ct Angio Neck W/cm &/or Wo/cm  06/20/2015  CLINICAL DATA:  Cerebellar stroke on MRI.  Dizziness. EXAM: CT ANGIOGRAPHY HEAD AND NECK TECHNIQUE: Multidetector CT imaging of the head and neck was performed using the standard protocol during bolus administration of intravenous contrast. Multiplanar CT image reconstructions and MIPs were obtained to evaluate the vascular anatomy. Carotid stenosis measurements (when applicable) are obtained utilizing NASCET criteria, using the distal internal carotid diameter as the denominator. CONTRAST:  50 mL Isovue 370 COMPARISON:  Brain MRI  06/20/2015.  Soft tissue neck CT 11/09/2011. FINDINGS: CT HEAD Brain: Left cerebellar edema corresponds to the acute/early subacute infarct described on earlier MRI. There is no evidence of acute intracranial hemorrhage, mass, midline shift, or extra-axial fluid collection. Ventricles and sulci are within normal limits for age. Cerebral white matter hypodensities are nonspecific but compatible with moderate chronic small vessel ischemic disease. Calvarium and skull base: 1.6 cm lytic lesion in the right parietal bone is benign in appearance on MRI. Paranasal sinuses: Subtotal opacification of the right maxillary sinus. Small bilateral mastoid effusions. Orbits: Prior bilateral cataract extraction and bilateral exophthalmos. CTA NECK Aortic arch: 3 vessel aortic arch with mild-to-moderate atherosclerotic plaque. Brachiocephalic artery is widely patent. There is a kinked appearance of the proximal right subclavian artery with calcified plaque eccentrically at the origin. In combination, these findings result in approximately 65% narrowing. Moderate atherosclerotic plaque in the proximal left subclavian artery does not result in significant stenosis. Right carotid system: Widely patent common carotid artery. There is prominent mixed calcified and noncalcified plaque in the proximal ICA which results in moderate luminal irregularity but no significant stenosis. The more distal cervical ICA is unremarkable. Left carotid system: Widely patent common carotid artery with minimal atherosclerotic irregularity. Mild non stenotic calcified plaque at the carotid bifurcation. Otherwise unremarkable cervical ICA. Vertebral arteries: The right vertebral artery is widely patent and dominant with only trace calcified plaque near its origin. Calcified plaque at the left vertebral artery origin results in moderate stenosis. There is occlusion of the left V2 segment at the C5 level with only intermittent reconstitution of the distal V2  and V3 segments. Skeleton: Moderate to advanced disc and facet degeneration throughout the cervical spine. 3 mm degenerative anterolisthesis of C7  on T1. Other neck: Interlobular septal thickening in the visualized right greater than left lung apices which may reflect mild edema. Partially visualized small right pleural effusion. CTA HEAD Anterior circulation: Intracranial internal carotid arteries are patent with moderate right and mild left calcified plaque without significant stenosis. ACAs and MCAs are patent with mild branch vessel irregularity but no evidence of major branch occlusion or significant proximal stenosis. No intracranial aneurysm is identified. Posterior circulation: Intracranial right vertebral artery is patent with mild stenosis due to calcified plaque. There is reconstitution of the intracranial left vertebral artery which may reflect retrograde flow. Both PICA is are grossly patent. AICA and SCA origins are patent, with possible stenosis of the right SCA origin. Basilar artery is widely patent. There are patent posterior communicating arteries bilaterally. PCAs are patent without evidence of significant stenosis. Venous sinuses: Patent. Anatomic variants: None. Delayed phase: No abnormal enhancement. IMPRESSION: 1. Acute/early subacute left cerebellar infarct.  No hemorrhage. 2. Proximal left V2 vertebral artery occlusion with intermittent distal reconstitution. 3. Patent and dominant right vertebral artery with mild V4 stenosis. 4. Right greater than left intracranial and extracranial carotid artery atherosclerosis without significant stenosis. Electronically Signed   By: Logan Bores M.D.   On: 06/20/2015 13:16        Scheduled Meds: Continuous Infusions:   LOS: 2 days    Time spent: 35 Mins    Bonnell Public, MD [facility] Pager [phone]  If 7PM-7AM, please contact night-coverage [email or web address] Password [email or web address] 06/22/2015, 8:04 AM

## 2015-06-22 NOTE — Progress Notes (Signed)
RN discussed repeat CT that  Is to take place today as well as PT and OT evaluations in order to determine discharge. Patient stated he is very claustrophobic. RN re-educated patient. Patient anxiety level began to increase. Patient repeatedly stated he needed some type of medication prior to CT scan. Report given to oncoming RN.

## 2015-06-22 NOTE — Progress Notes (Signed)
STROKE TEAM PROGRESS NOTE  HISTORY (from 06/20/2015) [name] [name] is an 75 y.o. male history diabetes mellitus, hypertension, hyperlipidemia, myasthenia gravis, DVT and pulmonary embolus on Coumadin, presenting following 3 days of vertigo with associated nausea as well as unstable gait. Symptoms were of sudden onset at 10:30 AM on 02/17/2015. He had no previous history of stroke nor TIA. He has not experienced diplopia. His been no change in speech or swallowing. He also has not experienced focal weakness of extremities. MRI of his brain showed acute stroke involving left inferior cerebellum, PICA territory. NIH stroke score at the time of this evaluation was 0.    SUBJECTIVE (INTERVAL HISTORY) The patient's wife and a friend were at the bedside. The patient reports it is still feeling dizziness on standing, and unstable gait. Pending PT/OT. Pending CT to rule out hydrocephalus.   OBJECTIVE Temp:  [98.2 F (36.8 C)-98.6 F (37 C)] 98.5 F (36.9 C) (05/13 0958) Pulse Rate:  [59-73] 59 (05/13 0958) Cardiac Rhythm:  [-] Normal sinus rhythm (05/13 0800) Resp:  [18-20] 18 (05/13 0958) BP: (141-184)/(64-87) 167/71 mmHg (05/13 0958) SpO2:  [95 %-100 %] 99 % (05/13 0958)  CBC:   Recent Labs Lab 06/19/15 1750  WBC 9.7  HGB 11.2*  HCT 34.7*  MCV 94.8  PLT [id number]    Basic Metabolic Panel:   Recent Labs Lab 06/19/15 1750  NA 139  K 3.6  CL 109  CO2 23  GLUCOSE 117*  BUN 6  CREATININE 1.14  CALCIUM 8.5*    Lipid Panel:     Component Value Date/Time   CHOL 104 06/21/2015 0352   TRIG 129 06/21/2015 0352   HDL 26* 06/21/2015 0352   CHOLHDL 4.0 06/21/2015 0352   VLDL 26 06/21/2015 0352   LDLCALC 52 06/21/2015 0352   HgbA1c:  Lab Results  Component Value Date   HGBA1C 5.7* 06/21/2015   Urine Drug Screen: No results found for: LABOPIA, COCAINSCRNUR, LABBENZ, AMPHETMU, THCU, LABBARB    IMAGING  Dg Chest 2 View 06/19/2015   Tiny bilateral pleural effusions. No evidence of  pulmonary edema or consolidation. Stable mild cardiomegaly.    [name] Contrast 06/20/2015   Acute to early subacute nonhemorrhagic LEFT posterior inferior cerebellar artery territory infarct. LEFT vertebral artery occlusion (less likely slow flow). Moderate chronic small vessel ischemic disease. No convincing evidence of abnormal intracranial enhancement on moderately motion degraded post gadolinium sequences.    CTA head and neck - 1. Acute/early subacute left cerebellar infarct. No hemorrhage. 2. Proximal left V2 vertebral artery occlusion with intermittent distal reconstitution. 3. Patent and dominant right vertebral artery with mild V4 stenosis. 4. Right greater than left intracranial and extracranial carotid artery atherosclerosis without significant stenosis.   TTE  Left ventricle: The cavity size was normal. Wall thickness was  increased in a pattern of mild LVH. There was mild focal basal  hypertrophy of the septum. Systolic function was normal. The  estimated ejection fraction was in the range of 55% to 60%. Wall  motion was normal; there were no regional wall motion  abnormalities. Doppler parameters are consistent with abnormal  left ventricular relaxation (grade 1 diastolic dysfunction).  Doppler parameters are consistent with high ventricular filling  pressure. - Left atrium: The atrium was mildly dilated. Impressions: - Normal LV systolic function; grade 1 diastolic dysfunction;  elevated LV filling pressure; mild LVH; mild LAE.   Head CT without contrast - pending    PHYSICAL EXAM  Temp:  [98.2  F (36.8 C)-98.6 F (37 C)] 98.5 F (36.9 C) (05/13 0958) Pulse Rate:  [59-73] 59 (05/13 0958) Resp:  [18-20] 18 (05/13 0958) BP: (141-184)/(64-87) 167/71 mmHg (05/13 0958) SpO2:  [95 %-100 %] 99 % (05/13 0958)  General - Well nourished, well developed, in no apparent distress.  Ophthalmologic - Fundi not visualized due to  noncooperation.  Cardiovascular - irregular heart rhythm, tele showed MAT.  Mental Status -  Level of arousal and orientation to time, place, and person were intact. Language including expression, naming, repetition, comprehension was assessed and found intact. Fund of Knowledge was assessed and was intact.  Cranial Nerves II - XII - II - Visual field intact OU. III, IV, VI - Extraocular movements intact. V - Facial sensation intact bilaterally. VII - Facial movement intact bilaterally. VIII - Hearing & vestibular intact bilaterally, no nystagmus. X - Palate elevates symmetrically. XI - Chin turning & shoulder shrug intact bilaterally. XII - Tongue protrusion intact.  Motor Strength - The patient's strength was normal in all extremities and pronator drift was absent.  Bulk was normal and fasciculations were absent.   Motor Tone - Muscle tone was assessed at the neck and appendages and was normal.  Reflexes - The patient's reflexes were 1+ in all extremities and he had no pathological reflexes.  Sensory - Light touch, temperature/pinprick were assessed and were symmetrical.    Coordination - The patient had normal movements in the hands and feet with no ataxia or dysmetria.  Tremor was absent.  Gait and Station - not tested due to safety concerns.   ASSESSMENT/PLAN [name] is a 75 y.o. 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 Etiology could be cardioembolic as pt has MAT on exam with potential afib vs. 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 Repeat CT head today - pending, if unremarkable and does not show any hydrocephalus the stroke team will sign off. Follow up w th Dr. Jannifer Franklin in 2 months.  2D Echo  EF 55-60%  LDL 52  HgbA1c 5.7  Coumadin  For VTE prophylaxis Diet Heart Room service appropriate?: Yes; Fluid consistency:: Thin  warfarin daily prior to admission, now on Eliquis bid.  Patient counseled to be compliant with his antithrombotic medications  Ongoing aggressive stroke risk factor management  Therapy recommendations:  CIR recommended  Disposition:  pending  (lives w/ wife)  Has seen Dr. Jannifer Franklin in the past. Follow up with him after discharge.  Hx of DVT and PE  On long term coumadin treatment  INR 2.03 on admission  INR now 1.56  The patient is now on Eliquis.  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 Jannifer Franklin in 2 months after discharge.  Mikey Bussing PA-C [facility] Pager [phone] 06/22/2015, 11:58 AM   Personally examined patient and images, and have participated in and made any corrections needed to history, physical, neuro exam,assessment and plan as stated above.  I have personally obtained the history, evaluated lab date, reviewed imaging studies and agree with radiology interpretations.    Sarina Ill, MD Stroke Neurology [phone] [facility]    Neuro stable. Pending PT/OT. Repeat CT head in am to rule out hydrocephalus. On Eliquis. Follow up with Dr. Jannifer Franklin in 2 months after discharge.        To contact Stroke Continuity provider, please refer to [email or web address] After hours, contact General Neurology

## 2015-06-23 ENCOUNTER — Inpatient Hospital Stay (HOSPITAL_COMMUNITY): Payer: Medicare Other

## 2015-06-23 NOTE — Progress Notes (Signed)
Pt ambulates assist x1 with rolling walker. Pt gait somewhat unsteady. He is aware. He calls for assistance with transfers and ambulation. Safety measures in place. Call bell within reach. Wife at bedside. Will continue to monitor.

## 2015-06-23 NOTE — Progress Notes (Signed)
PROGRESS NOTE    [name]  [id number] DOB: Jul 18, 1940 DOA: 06/19/2015 PCP: Renato Shin, MD   Outpatient Specialists:    Brief Narrative: 75 y.o. 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 3 days ago, the patient was getting ready for lunch with his wife, when he had abrupt onset of vertigo and imbalance. He also had diaphoresis, lightheadedness, weakness, and vomiting, and so his wife called 9-1-1, and went to the hospital in [place]. There he was diagnosed with dehydration, and observed for 2 days, discharged Wednesday. As soon as he got home, he had inability to walk, continued vertigo and nausea, and so his wife brought him to [facility]. 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 Acute cerebellar stroke: Awaiting disposition. In patient rehab consulted. Continue current treatment. 2. Myasthenia gravis: Stable. 3. Coronary disease and chronic diastolic CHF: Stable. 4. CML: Continue home [place] 5. Hx of PE: Elliquis as above. 6. NIDDM: Optimize.    DVT prophylaxis: Anticoagulated Code Status: Full Family Communication: Wife and daughter. Disposition Plan: May need rehab Consultants:   Neurology   Subjective: Nil new complaints. Still having vertigo.  Objective: Filed Vitals:   06/23/15 0218 06/23/15 0612 06/23/15 0854 06/23/15 1042  BP: 146/67 129/86 151/70 154/70  Pulse: 67 69 59 59  Temp: 97.8 F (36.6 C) 98.2 F (36.8  C) 97.7 F (36.5 C) 97.7 F (36.5 C)  TempSrc: Oral Oral Oral Oral  Resp: 18 18 18 18   Weight:      SpO2: 96% 96% 99% 98%   No intake or output data in the 24 hours ending 06/23/15 1538 Filed Weights   06/20/15 1500  Weight: 86.2 kg (190 lb 0.6 oz)    Examination:  General exam: Appears calm and comfortable  Respiratory system: Clear to auscultation.  Cardiovascular system: S1 & S2. Gastrointestinal system: Abdomen is obese, soft and nontender. No organomegaly or masses felt. Normal bowel sounds heard. Central nervous system: Alert and oriented. Gait imbalance Extremities: No leg edema.  Data Reviewed: I have personally reviewed following labs and imaging studies  CBC:  Recent Labs Lab 06/19/15 1750  WBC 9.7  HGB 11.2*  HCT 34.7*  MCV 94.8  PLT Q000111Q   Basic Metabolic Panel:  Recent Labs Lab 06/19/15 1750  NA 139  K 3.6  CL 109  CO2 23  GLUCOSE 117*  BUN 6  CREATININE 1.14  CALCIUM 8.5*   GFR: Estimated Creatinine Clearance: 55.3 mL/min (by C-G formula based on Cr of 1.14). Liver Function Tests: No results for input(s): AST, ALT, ALKPHOS, BILITOT, PROT, ALBUMIN in the last 168 hours. No results for input(s): LIPASE, AMYLASE in the last 168 hours. No results for input(s): AMMONIA in the last 168 hours. Coagulation Profile:  Recent Labs Lab 06/19/15 1750 06/21/15 0352  INR 2.03* 1.56*   Cardiac Enzymes: No results for input(s): CKTOTAL, CKMB, CKMBINDEX, TROPONINI in the last 168 hours. BNP (last 3 results)  Recent Labs  03/13/15  1534  PROBNP 58.0   HbA1C:  Recent Labs  06/21/15 0352  HGBA1C 5.7*   CBG: No results for input(s): GLUCAP in the last 168 hours. Lipid Profile:  Recent Labs  06/21/15 0352  CHOL 104  HDL 26*  LDLCALC 52  TRIG 129  CHOLHDL 4.0   Thyroid Function Tests: No results for input(s): TSH, T4TOTAL, FREET4, T3FREE, THYROIDAB in the last 72 hours. Anemia Panel: No results for input(s): VITAMINB12, FOLATE,  FERRITIN, TIBC, IRON, RETICCTPCT in the last 72 hours. Urine analysis:    Component Value Date/Time   COLORURINE YELLOW 06/19/2015 2107   APPEARANCEUR CLEAR 06/19/2015 2107   LABSPEC 1.014 06/19/2015 2107   PHURINE 5.5 06/19/2015 2107   GLUCOSEU NEGATIVE 06/19/2015 2107   GLUCOSEU NEGATIVE 04/09/2014 0910   HGBUR NEGATIVE 06/19/2015 2107   BILIRUBINUR NEGATIVE 06/19/2015 2107   KETONESUR 15* 06/19/2015 2107   PROTEINUR NEGATIVE 06/19/2015 2107   UROBILINOGEN 1.0 04/09/2014 0910   NITRITE NEGATIVE 06/19/2015 2107   LEUKOCYTESUR NEGATIVE 06/19/2015 2107   Sepsis Labs: @LABRCNTIP (procalcitonin:4,lacticidven:4)  )No results found for this or any previous visit (from the past 240 hour(s)).       Radiology Studies: Ct Head Wo Contrast  06/23/2015  CLINICAL DATA:  Follow-up infarct. History of hypertension, hyperlipidemia, carotid artery disease, diabetes. EXAM: CT HEAD WITHOUT CONTRAST TECHNIQUE: Contiguous axial images were obtained from the base of the skull through the vertex without intravenous contrast. COMPARISON:  CT HEAD Jun 20, 2015 and MRI head Jun 20, 2015 FINDINGS: INTRACRANIAL CONTENTS: Wedge-like hypodensity LEFT and severe inferior cerebellum is similar in appearance. No intraparenchymal hemorrhage, mass effect, midline shift. Ventricles and sulci are overall normal for patient's age. Patchy supratentorial white matter hypodensities better characterized on prior MRI compatible with chronic small vessel ischemic disease. No abnormal extra-axial fluid collections. Basal cisterns are patent. Moderate calcific atherosclerosis of the carotid siphons and included vertebral arteries. ORBITS: The included ocular globes and orbital contents are non-suspicious. Status post bilateral ocular lens implants. SINUSES: Moderate RIGHT maxillary sinusitis. Mastoid air cells are well aerated. SKULL/SOFT TISSUES: No skull fracture. No significant soft tissue swelling. IMPRESSION: Evolving acute  LEFT posterior inferior cerebellar artery territory infarct without propagation or hemorrhagic conversion. Involutional changes. Moderate chronic small vessel ischemic disease. Electronically Signed   By: Elon Alas M.D.   On: 06/23/2015 06:23        Scheduled Meds: Continuous Infusions:   LOS: 3 days    Time spent: 35 Mins    Bonnell Public, MD [facility] Pager [phone]  If 7PM-7AM, please contact night-coverage [email or web address] Password [facility] 06/23/2015, 3:38 PM

## 2015-06-23 NOTE — Progress Notes (Signed)
Physical Therapy Treatment Patient Details Name: [name] MRN: [id number] DOB: [birth year] Today's Date: 06/23/2015    History of Present Illness Patient is a 75 y.o. male with hx of DVT, CML, DM, HTN and carotid artery disease, osteoporosis, chronic edema, HOH, mixed HLD, diverticulosis, myasthenia gravis, GERD, PE, DJD, ptosis, dystonia, back surgeries, left shoulder surgery who presented with vertigo and unsteady gait. MRI-left PICA infarct, left vertebral artery occlusion.     PT Comments    Patient progressing slowly towards PT goals. Better able to use gaze stabilization during gait training especially in busy hallways for balance. Gait training performed with RW and with rail for support. Marked weakness noted in BLEs demonstrated by jerking like movements. Requires Mod A for balance. Highly motivated. Dizziness causes nausea. Great CIR candidate. Will follow.   Follow Up Recommendations  CIR     Equipment Recommendations  Other (comment) (TBA)    Recommendations for Other Services       Precautions / Restrictions Precautions Precautions: Fall Precaution Comments: vertigo Restrictions Weight Bearing Restrictions: No    Mobility  Bed Mobility                  Transfers Overall transfer level: Needs assistance Equipment used: Rolling walker (2 wheeled) Transfers: Sit to/from Stand Sit to Stand: Min assist         General transfer comment: Min A to steady pt in standing. Stood from [employer]. Cues for hand placement/technique.  Ambulation/Gait Ambulation/Gait assistance: Mod assist Ambulation Distance (Feet): 100 Feet Assistive device: Rolling walker (2 wheeled);None Gait Pattern/deviations: Step-through pattern;Shuffle;Decreased stride length;Staggering right;Staggering left Gait velocity: decreased   General Gait Details: Ambulating with RW for support; emphasized gaze stabilization for turning and for ambulation in busy hallway. Progressed to  ambulation with rail and HHA for support. Cues to decrease speed. Weakness noted in BLEs as jerking like movements. Cues to turn eyes, head and then body to decrease dizzy sensation.   Stairs            Wheelchair Mobility    Modified Rankin (Stroke Patients Only) Modified Rankin (Stroke Patients Only) Pre-Morbid Rankin Score: No symptoms Modified Rankin: Moderately severe disability     Balance Overall balance assessment: Needs assistance Sitting-balance support: Feet supported;No upper extremity supported Sitting balance-Leahy Scale: Good     Standing balance support: During functional activity Standing balance-Leahy Scale: Fair Standing balance comment: Tolerated static/dynamic standing with UE support, Min A- reaching for personal pictures/objects in room. Stood ~2-3 minutes.                    Cognition Arousal/Alertness: Awake/alert Behavior During Therapy: WFL for tasks assessed/performed Overall Cognitive Status: Impaired/Different from baseline Area of Impairment: Safety/judgement         Safety/Judgement: Decreased awareness of safety;Decreased awareness of deficits     General Comments: Pt continually getting up in room with wife despite knowing he is not steay on feet.     Exercises      General Comments General comments (skin integrity, edema, etc.): Wife present during session.      Pertinent Vitals/Pain Pain Assessment: No/denies pain    Home Living                      Prior Function            PT Goals (current goals can now be found in the care plan section) Progress towards PT goals: Progressing toward goals  Frequency  Min 4X/week    PT Plan Current plan remains appropriate    Co-evaluation             End of Session Equipment Utilized During Treatment: Gait belt Activity Tolerance: Patient tolerated treatment well Patient left: in bed;with call bell/phone within reach;with bed alarm set;with  family/visitor present     Time: [id number] PT Time Calculation (min) (ACUTE ONLY): 32 min  Charges:  $Gait Training: 23-37 mins                    G Codes:      [place] 06/23/2015, 10:26 AM Wray Kearns, [place], DPT [phone]

## 2015-06-24 ENCOUNTER — Telehealth: Payer: Self-pay | Admitting: Endocrinology

## 2015-06-24 ENCOUNTER — Inpatient Hospital Stay (HOSPITAL_COMMUNITY)
Admission: RE | Admit: 2015-06-24 | Discharge: 2015-06-29 | DRG: 057 | Disposition: A | Discharge status: Home Health | Payer: Medicare Other | Source: Intra-hospital | Attending: Physical Medicine & Rehabilitation | Admitting: Physical Medicine & Rehabilitation

## 2015-06-24 DIAGNOSIS — Z7901 Long term (current) use of anticoagulants: Secondary | ICD-10-CM

## 2015-06-24 DIAGNOSIS — E782 Mixed hyperlipidemia: Secondary | ICD-10-CM

## 2015-06-24 DIAGNOSIS — M47812 Spondylosis without myelopathy or radiculopathy, cervical region: Secondary | ICD-10-CM | POA: Diagnosis present

## 2015-06-24 DIAGNOSIS — I69398 Other sequelae of cerebral infarction: Secondary | ICD-10-CM | POA: Diagnosis not present

## 2015-06-24 DIAGNOSIS — E1143 Type 2 diabetes mellitus with diabetic autonomic (poly)neuropathy: Secondary | ICD-10-CM

## 2015-06-24 DIAGNOSIS — I2510 Atherosclerotic heart disease of native coronary artery without angina pectoris: Secondary | ICD-10-CM | POA: Insufficient documentation

## 2015-06-24 DIAGNOSIS — E119 Type 2 diabetes mellitus without complications: Secondary | ICD-10-CM

## 2015-06-24 DIAGNOSIS — H9190 Unspecified hearing loss, unspecified ear: Secondary | ICD-10-CM | POA: Diagnosis not present

## 2015-06-24 DIAGNOSIS — Z86711 Personal history of pulmonary embolism: Secondary | ICD-10-CM

## 2015-06-24 DIAGNOSIS — Z7952 Long term (current) use of systemic steroids: Secondary | ICD-10-CM

## 2015-06-24 DIAGNOSIS — I63119 Cerebral infarction due to embolism of unspecified vertebral artery: Secondary | ICD-10-CM | POA: Diagnosis not present

## 2015-06-24 DIAGNOSIS — N189 Chronic kidney disease, unspecified: Secondary | ICD-10-CM

## 2015-06-24 DIAGNOSIS — I5032 Chronic diastolic (congestive) heart failure: Secondary | ICD-10-CM

## 2015-06-24 DIAGNOSIS — G7000 Myasthenia gravis without (acute) exacerbation: Secondary | ICD-10-CM

## 2015-06-24 DIAGNOSIS — M7661 Achilles tendinitis, right leg: Secondary | ICD-10-CM

## 2015-06-24 DIAGNOSIS — R269 Unspecified abnormalities of gait and mobility: Secondary | ICD-10-CM

## 2015-06-24 DIAGNOSIS — I69393 Ataxia following cerebral infarction: Principal | ICD-10-CM | POA: Diagnosis present

## 2015-06-24 DIAGNOSIS — Z86718 Personal history of other venous thrombosis and embolism: Secondary | ICD-10-CM | POA: Diagnosis not present

## 2015-06-24 DIAGNOSIS — I10 Essential (primary) hypertension: Secondary | ICD-10-CM

## 2015-06-24 DIAGNOSIS — I69322 Dysarthria following cerebral infarction: Secondary | ICD-10-CM | POA: Diagnosis not present

## 2015-06-24 DIAGNOSIS — D62 Acute posthemorrhagic anemia: Secondary | ICD-10-CM

## 2015-06-24 DIAGNOSIS — I130 Hypertensive heart and chronic kidney disease with heart failure and stage 1 through stage 4 chronic kidney disease, or unspecified chronic kidney disease: Secondary | ICD-10-CM | POA: Diagnosis not present

## 2015-06-24 DIAGNOSIS — I63112 Cerebral infarction due to embolism of left vertebral artery: Secondary | ICD-10-CM

## 2015-06-24 DIAGNOSIS — C9210 Chronic myeloid leukemia, BCR/ABL-positive, not having achieved remission: Secondary | ICD-10-CM | POA: Diagnosis not present

## 2015-06-24 DIAGNOSIS — K219 Gastro-esophageal reflux disease without esophagitis: Secondary | ICD-10-CM | POA: Diagnosis not present

## 2015-06-24 DIAGNOSIS — Z79899 Other long term (current) drug therapy: Secondary | ICD-10-CM

## 2015-06-24 DIAGNOSIS — R29898 Other symptoms and signs involving the musculoskeletal system: Secondary | ICD-10-CM | POA: Diagnosis present

## 2015-06-24 DIAGNOSIS — E1122 Type 2 diabetes mellitus with diabetic chronic kidney disease: Secondary | ICD-10-CM

## 2015-06-24 LAB — GLUCOSE, CAPILLARY: Glucose-Capillary: 141 mg/dL — ABNORMAL HIGH (ref 65–99)

## 2015-06-24 MED ORDER — PYRIDOSTIGMINE BROMIDE 60 MG PO TABS
60.0000 mg | ORAL_TABLET | Freq: Three times a day (TID) | ORAL | Status: DC
  Administered 2015-06-25 – 2015-06-29 (×9): 60 mg via ORAL
  Filled 2015-06-24 (×14): qty 1

## 2015-06-24 MED ORDER — DOCUSATE SODIUM 100 MG PO CAPS
100.0000 mg | ORAL_CAPSULE | Freq: Two times a day (BID) | ORAL | Status: DC
  Administered 2015-06-25 – 2015-06-28 (×3): 100 mg via ORAL
  Filled 2015-06-24 (×9): qty 1

## 2015-06-24 MED ORDER — INSULIN ASPART 100 UNIT/ML ~~LOC~~ SOLN: 0.0000 [IU] | Freq: Every day | SUBCUTANEOUS | Status: DC

## 2015-06-24 MED ORDER — IMATINIB MESYLATE 100 MG PO TABS
400.0000 mg | ORAL_TABLET | Freq: Every day | ORAL | Status: DC
  Filled 2015-06-24: qty 4

## 2015-06-24 MED ORDER — LORAZEPAM 0.5 MG PO TABS: 0.5000 mg | ORAL_TABLET | Freq: Two times a day (BID) | ORAL | Status: DC | PRN

## 2015-06-24 MED ORDER — LORAZEPAM 0.5 MG PO TABS
0.5000 mg | ORAL_TABLET | Freq: Two times a day (BID) | ORAL | Status: DC | PRN
Start: 2015-06-24 — End: 2015-06-29
  Administered 2015-06-25 – 2015-06-26 (×3): 0.5 mg via ORAL
  Filled 2015-06-24 (×3): qty 1

## 2015-06-24 MED ORDER — TIMOLOL MALEATE 0.5 % OP SOLN
1.0000 [drp] | Freq: Two times a day (BID) | OPHTHALMIC | Status: DC
  Administered 2015-06-24 – 2015-06-29 (×10): 1 [drp] via OPHTHALMIC

## 2015-06-24 MED ORDER — FLEET ENEMA 7-19 GM/118ML RE ENEM: 1.0000 | ENEMA | Freq: Once | RECTAL | Status: DC | PRN

## 2015-06-24 MED ORDER — APIXABAN 5 MG PO TABS: 5.0000 mg | ORAL_TABLET | Freq: Two times a day (BID) | ORAL | Status: DC

## 2015-06-24 MED ORDER — NITROGLYCERIN 0.4 MG SL SUBL: 0.4000 mg | SUBLINGUAL_TABLET | SUBLINGUAL | Status: DC | PRN

## 2015-06-24 MED ORDER — DIPHENHYDRAMINE HCL 12.5 MG/5ML PO ELIX: 12.5000 mg | ORAL_SOLUTION | Freq: Four times a day (QID) | ORAL | Status: DC | PRN

## 2015-06-24 MED ORDER — ALUM & MAG HYDROXIDE-SIMETH 200-200-20 MG/5ML PO SUSP: 30.0000 mL | ORAL | Status: DC | PRN

## 2015-06-24 MED ORDER — INSULIN ASPART 100 UNIT/ML ~~LOC~~ SOLN
0.0000 [IU] | Freq: Three times a day (TID) | SUBCUTANEOUS | Status: DC
  Administered 2015-06-25 (×2): 1 [IU] via SUBCUTANEOUS
  Administered 2015-06-26: 2 [IU] via SUBCUTANEOUS
  Administered 2015-06-27 – 2015-06-28 (×3): 1 [IU] via SUBCUTANEOUS
  Administered 2015-06-28: 2 [IU] via SUBCUTANEOUS

## 2015-06-24 MED ORDER — ONDANSETRON HCL 4 MG PO TABS: 4.0000 mg | ORAL_TABLET | Freq: Four times a day (QID) | ORAL | Status: DC | PRN

## 2015-06-24 MED ORDER — FUROSEMIDE 20 MG PO TABS
20.0000 mg | ORAL_TABLET | Freq: Every day | ORAL | Status: DC
  Administered 2015-06-25 – 2015-06-29 (×5): 20 mg via ORAL
  Filled 2015-06-24 (×5): qty 1

## 2015-06-24 MED ORDER — METOPROLOL SUCCINATE ER 25 MG PO TB24
25.0000 mg | ORAL_TABLET | Freq: Every day | ORAL | Status: DC
  Administered 2015-06-25 – 2015-06-29 (×5): 25 mg via ORAL
  Filled 2015-06-24 (×5): qty 1

## 2015-06-24 MED ORDER — LOPERAMIDE HCL 2 MG PO CAPS
2.0000 mg | ORAL_CAPSULE | ORAL | Status: DC
  Administered 2015-06-25 – 2015-06-29 (×5): 2 mg via ORAL
  Filled 2015-06-24 (×5): qty 1

## 2015-06-24 MED ORDER — ADULT MULTIVITAMIN W/MINERALS CH
1.0000 | ORAL_TABLET | Freq: Every day | ORAL | Status: DC
  Administered 2015-06-25 – 2015-06-29 (×5): 1 via ORAL
  Filled 2015-06-24 (×5): qty 1

## 2015-06-24 MED ORDER — ISOSORBIDE MONONITRATE ER 30 MG PO TB24
30.0000 mg | ORAL_TABLET | Freq: Every day | ORAL | Status: DC
  Administered 2015-06-25 – 2015-06-29 (×5): 30 mg via ORAL
  Filled 2015-06-24 (×5): qty 1

## 2015-06-24 MED ORDER — BISACODYL 10 MG RE SUPP: 10.0000 mg | Freq: Every day | RECTAL | Status: DC | PRN

## 2015-06-24 MED ORDER — SENNOSIDES-DOCUSATE SODIUM 8.6-50 MG PO TABS: 1.0000 | ORAL_TABLET | Freq: Every evening | ORAL | Status: DC | PRN

## 2015-06-24 MED ORDER — ONDANSETRON HCL 4 MG/2ML IJ SOLN: 4.0000 mg | Freq: Four times a day (QID) | INTRAMUSCULAR | Status: DC | PRN

## 2015-06-24 MED ORDER — ENSURE ENLIVE PO LIQD: 237.0000 mL | Freq: Two times a day (BID) | ORAL | Status: DC

## 2015-06-24 MED ORDER — LIDOCAINE 5 % EX PTCH
1.0000 | MEDICATED_PATCH | CUTANEOUS | Status: DC
  Administered 2015-06-25 – 2015-06-29 (×5): 1 via TRANSDERMAL
  Filled 2015-06-24 (×5): qty 1

## 2015-06-24 MED ORDER — LISINOPRIL 10 MG PO TABS
10.0000 mg | ORAL_TABLET | Freq: Every day | ORAL | Status: DC
  Administered 2015-06-25 – 2015-06-27 (×3): 10 mg via ORAL
  Filled 2015-06-24 (×3): qty 1

## 2015-06-24 MED ORDER — ENSURE ENLIVE PO LIQD
237.0000 mL | Freq: Two times a day (BID) | ORAL | Status: DC
  Administered 2015-06-24: 237 mL via ORAL
  Filled 2015-06-24 (×3): qty 237

## 2015-06-24 MED ORDER — APIXABAN 5 MG PO TABS
5.0000 mg | ORAL_TABLET | Freq: Two times a day (BID) | ORAL | Status: DC
  Administered 2015-06-24 – 2015-06-29 (×10): 5 mg via ORAL
  Filled 2015-06-24 (×10): qty 1

## 2015-06-24 MED ORDER — ROSUVASTATIN CALCIUM 5 MG PO TABS
5.0000 mg | ORAL_TABLET | Freq: Every day | ORAL | Status: DC
  Administered 2015-06-25 – 2015-06-29 (×5): 5 mg via ORAL
  Filled 2015-06-24 (×5): qty 1

## 2015-06-24 MED ORDER — PREDNISONE 5 MG PO TABS
5.0000 mg | ORAL_TABLET | Freq: Every day | ORAL | Status: DC
  Administered 2015-06-25 – 2015-06-29 (×5): 5 mg via ORAL
  Filled 2015-06-24 (×5): qty 1

## 2015-06-24 MED ORDER — ACETAMINOPHEN 325 MG PO TABS: 325.0000 mg | ORAL_TABLET | ORAL | Status: DC | PRN

## 2015-06-24 MED ORDER — LISINOPRIL 10 MG PO TABS: 10.0000 mg | ORAL_TABLET | Freq: Every day | ORAL | Status: DC

## 2015-06-24 MED ORDER — ENSURE ENLIVE PO LIQD
237.0000 mL | Freq: Two times a day (BID) | ORAL | Status: DC
  Administered 2015-06-25 – 2015-06-27 (×4): 237 mL via ORAL

## 2015-06-24 MED ORDER — PANTOPRAZOLE SODIUM 40 MG PO TBEC
40.0000 mg | DELAYED_RELEASE_TABLET | Freq: Every day | ORAL | Status: DC
  Administered 2015-06-25 – 2015-06-29 (×5): 40 mg via ORAL
  Filled 2015-06-24 (×5): qty 1

## 2015-06-24 MED ORDER — GUAIFENESIN-DM 100-10 MG/5ML PO SYRP: 5.0000 mL | ORAL_SOLUTION | Freq: Four times a day (QID) | ORAL | Status: DC | PRN

## 2015-06-24 MED ORDER — BRIMONIDINE TARTRATE 0.2 % OP SOLN
1.0000 [drp] | Freq: Two times a day (BID) | OPHTHALMIC | Status: DC
  Administered 2015-06-24 – 2015-06-29 (×10): 1 [drp] via OPHTHALMIC

## 2015-06-24 NOTE — Consult Note (Signed)
Physical Medicine and Rehabilitation Consult Reason for Consult: Left PICA and cerebellar vermis infarcts Referring Physician: [facility]   HPI: [name] is a 75 y.o. right handed male with history of myasthenia gravis on prednisone and pyridostigmine, NIDDM, CAD with angioplasty, pulmonary emboli with chronic Coumadin. Per chart review patient lives with wife. Independent prior to admission and active. Two-level home with bedroom upstairs. 3 steps to entry of home. Patient with recent admission to [facility] 06/17/2015 for dehydration observed for 2 days discharged on 06/19/2015. Presented 06/20/2015 with increased weakness, vertigo, nausea. INR 2.00, troponin negative. 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 Left vertebral artery occlusion. CT angiogram of head and neck showed proximal left V2 vertebral artery occlusion with intermittent distal reconstitution. Echocardiogram with ejection fraction of 123456 grade 1 diastolic dysfunction. Patient did not receive TPA. Neurology consulted presently on eliquis. Tolerating a regular diet. Physical and occupational therapy evaluations completed with recommendations of physical medicine rehabilitation consult.  Review of Systems  Constitutional: Negative for fever and chills.  Eyes: Negative for blurred vision and double vision.  Respiratory: Negative for cough.        Occasional shortness of breath with exertion  Cardiovascular: Negative for chest pain.  Gastrointestinal: Positive for constipation. Negative for nausea and vomiting.       GERD  Genitourinary: Positive for urgency. Negative for dysuria and hematuria.  Skin: Negative for rash.  Neurological: Positive for weakness. Negative for speech change, seizures and loss of consciousness.       Vertigo  Psychiatric/Behavioral:       Anxiety  All other systems reviewed and are negative.  Past Medical  History  Diagnosis Date  . Coronary atherosclerosis of unspecified type of vessel, native or graft     a. cath in 2003 showed 10-20% LM, scattered 20% prox-mid LAD, 30% more distal LAD, 50-60% stenosis of LAD towards apex, 20% Cx, 30% dRCA, focal 95% stenosis in smaller of 2 branches of PDA, EF 60-65%.  . Mixed hyperlipidemia   . Glucose intolerance (impaired glucose tolerance)     history of  . Diverticulosis of colon (without mention of hemorrhage)   . Hx of colonic polyps   . Internal hemorrhoids without mention of complication   . Rash and other nonspecific skin eruption   . Conjunctivitis unspecified   . Myasthenia gravis ([place])     uses prednisone  . GERD (gastroesophageal reflux disease)   . Pulmonary embolism [facility]) June 2013    a. Bilateral PE 07/2011.  Marland Kitchen DVT (deep venous thrombosis) [facility]) June 2013  . Chronic anticoagulation   . Diabetes mellitus ([place])   . DJD (degenerative joint disease)   . Ptosis   . 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 Essential hypertension   . IBS (irritable bowel syndrome)   . History of pneumonia   . Dystonia   . 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 PONV (postoperative nausea and vomiting)   . External hemorrhoids without mention of complication   . History of kidney stones   . Tubulovillous adenoma polyp of colon     2010  . HOH (hard of hearing)     hearing aids  . Osteoporosis 2016  . Gastroparesis   . Esophageal stricture   . Chronic edema   . Carotid artery disease ([place])     a. Duplex 05/2014: 123456 RICA, 123456 LICA, elevated velocities in right  subclavian artery, normal left subclavian artery..   Past Surgical History  Procedure Laterality Date  . Angioplasty  2001  . Back surgery      x2, lumbar and cervical  . Tonsillectomy    . Cataract extraction Bilateral 12/12  . Shoulder surgery Left 05/07/2009  . Cardiac catheterization    . Lumbar laminectomy/decompression microdiscectomy Right 09/14/2012    Procedure: Right  Lumbar three-four, four-five, Lumbar five-Sacral one decompressive laminectomy;  Surgeon: [name], MD;  Location: [place] [facility];  Service: Neurosurgery;  Laterality: Right;  . Esophagogastroduodenoscopy endoscopy  04/2015   Family History  Problem Relation Age of Onset  . Heart disease Mother   . Heart attack Mother   . Stroke Mother   . Heart disease Father   . Heart attack Father   . Stroke Father   . Breast cancer Sister     Twin   . Heart attack Sister   . Dementia Sister   . Heart disease Sister   . Heart attack Sister   . Clotting disorder Sister   . Heart attack Sister   . Colon cancer Neg Hx    Social History:  reports that he has never smoked. He has never used smokeless tobacco. He reports that he does not drink alcohol or use illicit drugs. Allergies:  Allergies  Allergen Reactions  . Oxycodone-Acetaminophen Other (See Comments)    "too strong"  . Phenergan [Promethazine Hcl]     Eye swelling  . Sulfamethoxazole Rash and Itching   Medications Prior to Admission  Medication Sig Dispense Refill  . brimonidine-timolol (COMBIGAN) 0.2-0.5 % ophthalmic solution Place 1 drop into both eyes every 12 (twelve) hours.    Marland Kitchen esomeprazole (NEXIUM) 40 MG capsule Take 1 capsule (40 mg total) by mouth 2 (two) times daily. (Patient taking differently: Take 40 mg by mouth every morning. ) 60 capsule 6  . furosemide (LASIX) 20 MG tablet Take 20 mg by mouth daily.     Marland Kitchen imatinib (GLEEVEC) 400 MG tablet Take 400 mg by mouth daily with lunch.     . isosorbide mononitrate (IMDUR) 30 MG 24 hr tablet TAKE 1 TABLET EVERY DAY 90 tablet 3  . loperamide (IMODIUM) 2 MG capsule Take 2 mg by mouth every morning. For diarrhea    . LORazepam (ATIVAN) 0.5 MG tablet Take 0.5 mg by mouth 3 times/day as needed-between meals & bedtime for sleep.    . metoprolol succinate (TOPROL-XL) 25 MG 24 hr tablet TAKE 1 TABLET EVERY DAY 90 tablet 3  . Multiple Vitamin (MULTIVITAMIN) tablet Take 1 tablet by mouth  daily.    . nitroGLYCERIN (NITROSTAT) 0.4 MG SL tablet Place 1 tablet (0.4 mg total) under the tongue every 5 (five) minutes x 3 doses as needed for chest pain. 90 tablet 3  . predniSONE (DELTASONE) 5 MG tablet Take 1 tablet (5 mg total) by mouth daily. 90 tablet 1  . pyridostigmine (MESTINON) 60 MG tablet Take 1 tablet (60 mg total) by mouth 3 (three) times daily. 90 tablet 5  . rosuvastatin (CRESTOR) 10 MG tablet TAKE 1/2 TABLETS BY MOUTH DAILY. 45 tablet 0  . warfarin (COUMADIN) 5 MG tablet Take 2.5-5 mg by mouth daily. Saturday and Wednesday take 5mg , all other days take 2.5mg .      Home: Home Living Family/patient expects to be discharged to:: Inpatient rehab Living Arrangements: Spouse/significant other Available Help at Discharge: Family, Available 24 hours/day Type of Home: House Home Access: Stairs to enter Entrance  Stairs-Number of Steps: 3 Entrance Stairs-Rails: Right Home Layout: Two level, Bed/bath upstairs Alternate Level Stairs-Number of Steps: 9 steps; 1 flight to get to basement Alternate Level Stairs-Rails: Right, Left Home Equipment: Shower seat - built in Additional Comments: pt has walk-in shower   Functional History: Prior Function Level of Independence: Independent (prior to this recent episode) Comments: very active, likes to garden Functional Status:  Mobility: Bed Mobility Overal bed mobility: Needs Assistance Bed Mobility: Supine to Sit, Sit to Supine Supine to sit: Supervision Sit to supine: Supervision General bed mobility comments: Use of rail. + dizziness getting to EOB. no assist needed. Slow. Transfers Overall transfer level: Needs assistance Equipment used: Rolling walker (2 wheeled) Transfers: Sit to/from Stand Sit to Stand: Min assist General transfer comment: Min A to steady pt in standing. Stood from [employer]. Cues for hand placement/technique. Ambulation/Gait Ambulation/Gait assistance: Mod assist Ambulation Distance (Feet): 100  Feet Assistive device: Rolling walker (2 wheeled), None Gait Pattern/deviations: Step-through pattern, Shuffle, Decreased stride length, Staggering right, Staggering left General Gait Details: Ambulating with RW for support; emphasized gaze stabilization for turning and for ambulation in busy hallway. Progressed to ambulation with rail and HHA for support. Cues to decrease speed. Weakness noted in BLEs as jerking like movements. Cues to turn eyes, head and then body to decrease dizzy sensation. Gait velocity: decreased    ADL: ADL Overall ADL's : Needs assistance/impaired Lower Body Dressing: Minimal assistance, Sit to/from stand Toilet Transfer: Minimal assistance, Ambulation, RW (sit to stand from bed) Functional mobility during ADLs: Minimal assistance, Rolling walker General ADL Comments: Encouraged pt to look at one thing to help with dizziness.   Cognition: Cognition Overall Cognitive Status: Impaired/Different from baseline Orientation Level: Oriented X4 Cognition Arousal/Alertness: Awake/alert Behavior During Therapy: WFL for tasks assessed/performed Overall Cognitive Status: Impaired/Different from baseline Area of Impairment: Safety/judgement Safety/Judgement: Decreased awareness of safety, Decreased awareness of deficits General Comments: Pt continually getting up in room with wife despite knowing he is not steay on feet.   Blood pressure 134/75, pulse 58, temperature 97.9 F (36.6 C), temperature source Oral, resp. rate 20, height 5\' 8"  (1.727 m), weight 81.647 kg (180 lb), SpO2 98 %. Physical Exam  Vitals reviewed. Constitutional: He is oriented to person, place, and time. He appears well-developed and well-nourished.  HENT:  Head: Normocephalic and atraumatic.  Eyes: EOM are normal.  Injected sclera R>L  Neck: Normal range of motion. Neck supple. No thyromegaly present.  Cardiovascular: Normal rate and regular rhythm.   Respiratory: Effort normal and breath sounds  normal. No respiratory distress.  GI: Soft. Bowel sounds are normal. He exhibits no distension.  Musculoskeletal: He exhibits no edema or tenderness.  Neurological: He is alert and oriented to person, place, and time.  Follows full commands.  Some decreased fine motor skills. DTRs symmetric Sensation intact light touch Motor: Bilateral upper extremities: 4+/5 proximal to distal Bilateral lower extremity proximally 4 -/5, distally 4+/5  Skin: Skin is warm and dry.  Psychiatric: He has a normal mood and affect. His behavior is normal.    No results found for this or any previous visit (from the past 24 hour(s)). Ct Head Wo Contrast  06/23/2015  CLINICAL DATA:  Follow-up infarct. History of hypertension, hyperlipidemia, carotid artery disease, diabetes. EXAM: CT HEAD WITHOUT CONTRAST TECHNIQUE: Contiguous axial images were obtained from the base of the skull through the vertex without intravenous contrast. COMPARISON:  CT HEAD Jun 20, 2015 and MRI head Jun 20, 2015 FINDINGS: INTRACRANIAL CONTENTS:  Wedge-like hypodensity LEFT and severe inferior cerebellum is similar in appearance. No intraparenchymal hemorrhage, mass effect, midline shift. Ventricles and sulci are overall normal for patient's age. Patchy supratentorial white matter hypodensities better characterized on prior MRI compatible with chronic small vessel ischemic disease. No abnormal extra-axial fluid collections. Basal cisterns are patent. Moderate calcific atherosclerosis of the carotid siphons and included vertebral arteries. ORBITS: The included ocular globes and orbital contents are non-suspicious. Status post bilateral ocular lens implants. SINUSES: Moderate RIGHT maxillary sinusitis. Mastoid air cells are well aerated. SKULL/SOFT TISSUES: No skull fracture. No significant soft tissue swelling. 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  Involutional changes. Moderate chronic small vessel ischemic disease. Electronically Signed   By: Elon Alas M.D.   On: 06/23/2015 06:23    Assessment/Plan: Diagnosis: Left PICA and cerebellar vermis infarcts Labs and images independently reviewed.  Records reviewed and summated above. Stroke: Continue secondary stroke prophylaxis and Risk Factor Modification listed below:   Antiplatelet therapy:   Blood Pressure Management:  Continue current medication with prn's with permisive HTN per primary team Pre-diabetes Statin Agent:     1. Does the need for close, 24 hr/day medical supervision in concert with the patient's rehab needs make it unreasonable for this patient to be served in a less intensive setting? Yes  2. Co-Morbidities requiring supervision/potential complications: myasthenia gravis (cont meds, intermittent therapies), NIDDM (Monitor in accordance with exercise and adjust meds as necessary), CAD (cont meds), pulmonary emboli (cont meds), HTN (monitor and provide prns in accordance with increased physical exertion and pain), ABLA (transfuse if necessary to ensure appropriate perfusion for increased activity tolerance) 3. Due to safety, disease management and patient education, does the patient require 24 hr/day rehab nursing? Yes 4. Does the patient require coordinated care of a physician, rehab nurse, PT (1-2 hrs/day, 5 days/week) and OT (1-2 hrs/day, 5 days/week) to address physical and functional deficits in the context of the above medical diagnosis(es)? Yes Addressing deficits in the following areas: balance, endurance, locomotion, strength, transferring, dressing, toileting and psychosocial support 5. Can the patient actively participate in an intensive therapy program of at least 3 hrs of therapy per day at least 5 days per week? Yes 6. The potential for patient to make measurable gains while on inpatient rehab is excellent 7. Anticipated functional outcomes upon discharge  from inpatient rehab are modified independent and supervision  with PT, modified independent and supervision with OT, n/a with SLP. 8. Estimated rehab length of stay to reach the above functional goals is: 10-14 days. 9. Does the patient have adequate social supports and living environment to accommodate these discharge functional goals? Yes 10. Anticipated D/C setting: Home 11. Anticipated post D/C treatments: HH therapy and Home excercise program 12. Overall Rehab/Functional Prognosis: good  RECOMMENDATIONS: This patient's condition is appropriate for continued rehabilitative care in the following setting: CIR Patient has agreed to participate in recommended program. Yes Note that insurance prior authorization may be required for reimbursement for recommended care.  Comment: Rehab Admissions Coordinator to follow up.  Delice Lesch, MD 06/24/2015

## 2015-06-24 NOTE — Clinical Social Work Note (Signed)
Clinical Social Work Assessment  Patient Details  Name: [name] MRN: [id number] Date of Birth: 06/13/1940  Date of referral:  06/24/15               Reason for consult:  Facility Placement                Permission sought to share information with:  Facility Sport and exercise psychologist, Family Supports Permission granted to share information::  Yes, Verbal Permission Granted  Name::     Hassan Rowan  Agency::  [facility] SNFs/[place]  Relationship::  Spouse  Contact Information:  [phone]  Housing/Transportation Living arrangements for the past 2 months:  [place] of Information:  Patient, Spouse Patient Interpreter Needed:  None Criminal Activity/Legal Involvement Pertinent to Current Situation/Hospitalization:  No - Comment as needed Significant Relationships:  Spouse Lives with:  Spouse Do you feel safe going back to the place where you live?  Yes Need for family participation in patient care:  Yes (Comment)  Care giving concerns:  CSW received referral for possible SNF placement at time of discharge. CSW met with patient and patient's wife at bedside regarding PT recommendation of SNF placement at time of discharge if CIR is unable to admit. Patient and patient's wife expressed understanding of PT recommendation and are agreeable to SNF placement at time of discharge. CSW to continue to follow and assist with discharge planning needs.   Social Worker assessment / plan:  CSW spoke with patient and patient's wife concerning possibility of rehab at [facility] before returning home.  Employment status:  Retired Forensic scientist:  [employer] PT Recommendations:  [place] [place] / Referral to community resources:  [place]  Patient/Family's Response to care:  Patient and patient's wife recognize need for rehab before returning home and are agreeable to a SNF in [place]. Patient reported preference  for [facility]. Patient and wife expressed that they would also like to consider going home with home health if CIR is unable to admit.  Patient/Family's Understanding of and Emotional Response to Diagnosis, Current Treatment, and Prognosis:  Patient is realistic regarding therapy needs. No questions/concerns about plan or treatment.    Emotional Assessment Appearance:  Appears stated age Attitude/Demeanor/Rapport:  Other (Appropriate) Affect (typically observed):  Accepting, Appropriate Orientation:  Oriented to Self, Oriented to Place, Oriented to  Time, Oriented to Situation Alcohol / Substance use:  Not Applicable Psych involvement (Current and /or in the community):  No (Comment)  Discharge Needs  Concerns to be addressed:  Care Coordination Readmission within the last 30 days:  No Current discharge risk:  None Barriers to Discharge:  Continued Medical Work up   [employer], [place] 06/24/2015, 10:17 AM

## 2015-06-24 NOTE — Telephone Encounter (Signed)
PT wife called in and wanted to let Dr. Loanne Drilling know that PT is in the hospital, he had a stroke

## 2015-06-24 NOTE — Progress Notes (Signed)
Pt received rehab booklet , and discused safety plan and all rehab process.

## 2015-06-24 NOTE — Progress Notes (Signed)
Physical Therapy Treatment Patient Details Name: [name] MRN: [id number] DOB: May 15, 1940 Today's Date: 06/24/2015    History of Present Illness Patient is a 75 y.o. male with hx of DVT, CML, DM, HTN and carotid artery disease, osteoporosis, chronic edema, HOH, mixed HLD, diverticulosis, myasthenia gravis, GERD, PE, DJD, ptosis, dystonia, back surgeries, left shoulder surgery who presented with vertigo and unsteady gait. MRI-left PICA infarct, left vertebral artery occlusion.     PT Comments    Patient progressing slowly towards PT goals. Strength seems to be improving slowly but still demonstrates mild instability in knees during mobility. Focused on gaze stabilization during gait training and incorporating this with busy environments. Balance deficits more notable when multitasking, distractions, people in hallway. Continues to report nausea with sitting upright/mobility. Eager to get to rehab. Will continue to follow.   Follow Up Recommendations  CIR     Equipment Recommendations  Other (comment)    Recommendations for Other Services       Precautions / Restrictions Precautions Precautions: Fall Precaution Comments: vertigo Restrictions Weight Bearing Restrictions: No    Mobility  Bed Mobility Overal bed mobility: Needs Assistance Bed Mobility: Supine to Sit;Sit to Supine     Supine to sit: Supervision Sit to supine: Supervision   General bed mobility comments: Use of rail. + dizziness getting to EOB. no assist needed. Slow. performed x2.  Transfers Overall transfer level: Needs assistance Equipment used: None Transfers: Sit to/from Stand Sit to Stand: Min assist         General transfer comment: Min A to steady pt in standing. Sway noted due to dizziness. Stood from [employer]. Cues for hand placement/technique and for slow descent into chair.  Ambulation/Gait Ambulation/Gait assistance: Mod assist Ambulation Distance (Feet): 100 Feet (x2 bouts) Assistive  device: 1 person hand held assist Gait Pattern/deviations: Step-to pattern;Step-through pattern;Decreased stride length;Staggering right;Staggering left;Shuffle Gait velocity: decreased   General Gait Details: Cues for fixating gaze during ambulation and esp during turns to decrease dizzy sensation. Balance deficits most notably in busy hallways- walking people, talking etc. Less jerkling like movements today. 1 seated rest brealk. reaching for rail at times for BUE support. Cues to use eyes, turn head and then body for mobility.    Stairs            Wheelchair Mobility    Modified Rankin (Stroke Patients Only) Modified Rankin (Stroke Patients Only) Pre-Morbid Rankin Score: No symptoms Modified Rankin: Moderately severe disability     Balance Overall balance assessment: Needs assistance Sitting-balance support: Feet supported;No upper extremity supported Sitting balance-Leahy Scale: Good     Standing balance support: During functional activity Standing balance-Leahy Scale: Fair Standing balance comment: Requires UE support in standing. Practiced looking out big window and focusing on things in environment with head movements.                    Cognition Arousal/Alertness: Awake/alert Behavior During Therapy: WFL for tasks assessed/performed Overall Cognitive Status: Within Functional Limits for tasks assessed                      Exercises      General Comments        Pertinent Vitals/Pain Pain Assessment: No/denies pain    Home Living                      Prior Function            PT  Goals (current goals can now be found in the care plan section) Progress towards PT goals: Progressing toward goals    Frequency  Min 4X/week    PT Plan Current plan remains appropriate    Co-evaluation             End of Session Equipment Utilized During Treatment: Gait belt Activity Tolerance: Patient tolerated treatment well Patient  left: in bed;with call bell/phone within reach;with family/visitor present     Time: [id number] PT Time Calculation (min) (ACUTE ONLY): 26 min  Charges:  $Gait Training: 8-22 mins $Neuromuscular Re-education: 8-22 mins                    G Codes:      Alexica Schlossberg A Auri Jahnke 06/24/2015, 12:01 PM Wray Kearns, [place], DPT [phone]

## 2015-06-24 NOTE — NC FL2 (Signed)
[place] LEVEL OF CARE SCREENING TOOL     IDENTIFICATION  Patient Name: [name] Birthdate: Jan 26, 1941 Sex: male Admission Date (Current Location): 06/19/2015  [facility] and [place] Number:  [employer] and Address:  [facility] [place]. [facility], [place] [address] [place], [place] [address]      Provider Number: [id number]  Attending Physician Name and Address:  Bonnell Public, MD  Relative Name and Phone Number:  Hassan Rowan, spouse, [phone]    Current Level of Care: Hospital Recommended Level of Care: [place] Prior Approval Number:    Date Approved/Denied:   PASRR Number:    Discharge Plan: SNF    Current Diagnoses: Patient Active Problem List   Diagnosis Date Noted  . Cerebral infarction involving left cerebellar artery ([place]) 06/20/2015  . Cerebellar stroke ([place])   . Ischemic stroke ([place])   . Cerebrovascular accident (CVA) due to thrombosis of left vertebral artery ([place])   . History of pulmonary embolus (PE)   . Chronic anticoagulation   . HLD (hyperlipidemia)   . Neck pain 03/19/2015  . Renal insufficiency 03/13/2015  . Pain in the chest   . 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 Chest pain syndrome 03/07/2015  . Chronic diastolic heart failure, NYHA class 1 ([place]) 03/07/2015  . Diabetic gastroparesis ([place]) 03/07/2015  . Screening for osteoporosis 07/19/2014  . Routine general medical examination at a health care facility 10/23/2013  . Bilateral carotid bruits 05/24/2013  . Abnormal CT scan, lung 05/24/2013  . Encounter for therapeutic drug monitoring 03/14/2013  . Lymphadenopathy, inguinal 01/11/2013  . 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 Myasthenia gravis with exacerbation ([place]) 03/31/2012  . Ocular myasthenia ([place]) 09/23/2011  . GERD with stricture 09/23/2011  . Long term (current) use of anticoagulants 08/03/2011  . History of pulmonary embolism 07/30/2011  .  History of DVT (deep vein thrombosis) 07/30/2011  . Subconjunctival hemorrhage 06/22/2011  . Bilateral conjunctivitis 04/09/2011  . Diabetes mellitus type 2, controlled ([place]) 12/18/2010  . Hearing loss 08/04/2010  . Ptosis of eyelid 12/30/2009  . Essential hypertension 04/24/2009  . HYPERLIPIDEMIA, MIXED 10/05/2008  . HEMORRHOIDS-INTERNAL 07/02/2008  . HEMORRHOIDS-EXTERNAL 07/02/2008  . DIVERTICULOSIS-COLON 07/02/2008  . PERSONAL HX COLONIC POLYPS 07/02/2008  . RASH-NONVESICULAR 02/07/2008  . PNEUMONIA 12/08/2007  . CML (chronic myelocytic leukemia) ([place]) 07/07/2007  . CAD (coronary artery disease) 07/07/2007  . NEPHROLITHIASIS, HX OF 07/07/2007    Orientation RESPIRATION BLADDER Height & Weight     Self, Time, Situation, Place  Normal Continent Weight: 81.647 kg (180 lb) Height:  5\' 8"  (172.7 cm)  BEHAVIORAL SYMPTOMS/MOOD NEUROLOGICAL BOWEL NUTRITION STATUS      Continent Diet (Please see DC summary)  AMBULATORY STATUS COMMUNICATION OF NEEDS Skin   Limited Assist Verbally Normal                       Personal Care Assistance Level of Assistance  Bathing, Feeding, Dressing Bathing Assistance: Limited assistance Feeding assistance: Independent Dressing Assistance: Limited assistance     Functional Limitations Info             SPECIAL CARE FACTORS FREQUENCY  PT (By licensed PT)     PT Frequency: min 4x/week              Contractures      Additional Factors Info  Code Status, Allergies Code Status Info: DNR Allergies Info: Oxycodone-acetaminophen, Phenergan, Sulfamethoxazole  Current Medications (06/24/2015):  This is the current hospital active medication list Current Facility-Administered Medications  Medication Dose Route Frequency Provider Last Rate Last Dose  . apixaban (ELIQUIS) tablet 5 mg  5 mg Oral BID Erenest Blank, RPH   5 mg at 06/23/15 2343  . brimonidine (ALPHAGAN) 0.2 % ophthalmic solution 1 drop  1 drop Both Eyes Q12H  Edwin Dada, MD   1 drop at 06/23/15 2136   And  . timolol (TIMOPTIC) 0.5 % ophthalmic solution 1 drop  1 drop Both Eyes Q12H Edwin Dada, MD   1 drop at 06/23/15 2136  . furosemide (LASIX) tablet 20 mg  20 mg Oral Daily Rosalin Hawking, MD   20 mg at 06/23/15 1040  . imatinib (GLEEVEC) tablet 400 mg  400 mg Oral Q lunch Rosalin Hawking, MD   400 mg at 06/23/15 1200  . isosorbide mononitrate (IMDUR) 24 hr tablet 30 mg  30 mg Oral Daily Rosalin Hawking, MD   30 mg at 06/23/15 1040  . lisinopril (PRINIVIL,ZESTRIL) tablet 10 mg  10 mg Oral Daily Bonnell Public, MD   10 mg at 06/23/15 1040  . loperamide (IMODIUM) capsule 2 mg  2 mg Oral Rodney Booze Rosalin Hawking, MD   2 mg at 06/24/15 0649  . LORazepam (ATIVAN) tablet 0.5 mg  0.5 mg Oral BID BM & HS PRN Rosalin Hawking, MD   0.5 mg at 06/23/15 2343  . metoprolol succinate (TOPROL-XL) 24 hr tablet 25 mg  25 mg Oral Daily Rosalin Hawking, MD   25 mg at 06/23/15 1041  . multivitamin with minerals tablet 1 tablet  1 tablet Oral Daily Rosalin Hawking, MD   1 tablet at 06/23/15 1040  . nitroGLYCERIN (NITROSTAT) SL tablet 0.4 mg  0.4 mg Sublingual Q5 Min x 3 PRN Rosalin Hawking, MD      . pantoprazole (PROTONIX) EC tablet 40 mg  40 mg Oral Daily Rosalin Hawking, MD   40 mg at 06/23/15 1040  . predniSONE (DELTASONE) tablet 5 mg  5 mg Oral Daily Rosalin Hawking, MD   5 mg at 06/23/15 1040  . pyridostigmine (MESTINON) tablet 60 mg  60 mg Oral TID Rosalin Hawking, MD   60 mg at 06/23/15 1600  . rosuvastatin (CRESTOR) tablet 5 mg  5 mg Oral Daily Rosalin Hawking, MD   5 mg at 06/23/15 1040     Discharge Medications: Please see discharge summary for a list of discharge medications.  Relevant Imaging Results:  Relevant Lab Results:   Additional Information SSN: [place], [place]

## 2015-06-24 NOTE — Care Management Note (Signed)
Case Management Note  Patient Details  Name: [name] MRN: [id number] Date of Birth: May 30, 1940  Subjective/Objective:                    Action/Plan: Pt started on Eliquis. Benefits check revealed: Eliquis 5 mg BID is covered, copay estimated at $19.41. Prior Josem Kaufmann is not needed. CM will continue to follow for discharge needs.   Expected Discharge Date:                  Expected Discharge Plan:     In-House Referral:     Discharge planning Services     Post Acute Care Choice:    Choice offered to:     DME Arranged:    DME Agency:     HH Arranged:    HH Agency:     Status of Service:  In process, will continue to follow  Medicare Important Message Given:    Date Medicare IM Given:    Medicare IM give by:    Date Additional Medicare IM Given:    Additional Medicare Important Message give by:     If discussed at [place] of Stay Meetings, dates discussed:    Additional Comments:  Pollie Friar, RN 06/24/2015, 11:11 AM

## 2015-06-24 NOTE — Progress Notes (Signed)
I met with pt at bedside to discuss a possible admit to inpt rehab today if MD felt medically ready. Pt is in agreement. I will contact Dr. Marthenia Rolling to clarify d/c for today. RN CM, Vida Roller , is aware. [phone]

## 2015-06-24 NOTE — H&P (View-Only) (Signed)
Physical Medicine and Rehabilitation Admission H&P    Chief Complaint  Patient presents with  . Dizziness and problems walking.     HPI:  [name] is a 75 year old male with history of Myasthenia Gravis--prednisone snd pyridostigmine, diffuse CAD, PE-on coumadin, CKD, CML-on Gleevec, T2DM, recent admission to [facility] for dehydration 5/8-5/10 and was discharged to home but developed inability to walk with continued nausea and vomiting. He presented to [facility] ED 5/10 for work up and evaluation.   INR at admission 2 and MRI brain done revealing acute stroke involving inferior left cerebellum. CTA head and neck showed acute/early subacute left cerebellar infarct, proximal left V2 vertebral artery occlusion with intermittent distal reconstitution and patent dominant R-VA.  2D echo with EF 55-60% with no regional wall abnormality, elevated filling pressures and grade 1 diastolic dysfunction.  Neurology felt that stroke etiology could be cardioembolic due to MAT on exam v/s large vessel disease due to chronic L-VA occlusion with intermittent reconstitution.  Follow up CT head 05/14 showed evolving acute left PICA infarct without propagation or hemorrhagic conversion.  Coumadin changed to Eliquis and patient to follow up with Dr. Jannifer Franklin after discharge. Therapy ongoing and patient limited by dizziness due to gaze instability, nausea and BLE weakness with instability. CIR recommended by rehab team.      Review of Systems  Constitutional: Positive for malaise/fatigue.  HENT: Negative for hearing loss.   Eyes: Negative for blurred vision, double vision and discharge.  Respiratory: Negative for cough, shortness of breath and stridor.   Cardiovascular: Negative for chest pain and palpitations.  Gastrointestinal: Positive for heartburn, nausea and diarrhea (chronic issues). Negative for abdominal pain and constipation.  Genitourinary: Negative for dysuria, urgency and frequency.  Musculoskeletal: Positive  for myalgias and neck pain.  Skin: Negative for itching and rash.  Neurological: Positive for dizziness and headaches. Negative for sensory change and speech change.  Psychiatric/Behavioral: Negative for depression. The patient is nervous/anxious and has insomnia.   All other systems reviewed and are negative.     Past Medical History  Diagnosis Date  . Coronary atherosclerosis of unspecified type of vessel, native or graft     a. cath in 2003 showed 10-20% LM, scattered 20% prox-mid LAD, 30% more distal LAD, 50-60% stenosis of LAD towards apex, 20% Cx, 30% dRCA, focal 95% stenosis in smaller of 2 branches of PDA, EF 60-65%.  . Mixed hyperlipidemia   . Glucose intolerance (impaired glucose tolerance)     history of  . Diverticulosis of colon (without mention of hemorrhage)   . Hx of colonic polyps   . Internal hemorrhoids without mention of complication   . Rash and other nonspecific skin eruption   . Conjunctivitis unspecified   . Myasthenia gravis ([place])     uses prednisone  . GERD (gastroesophageal reflux disease)   . Pulmonary embolism [facility]) June 2013    a. Bilateral PE 07/2011.  Marland Kitchen DVT (deep venous thrombosis) [facility]) June 2013  . Chronic anticoagulation   . Diabetes mellitus ([place])   . DJD (degenerative joint disease)   . Ptosis   . 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)   . Essential hypertension   . IBS (irritable bowel syndrome)   . History of pneumonia   . Dystonia   . 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 PONV (postoperative nausea and vomiting)   . External hemorrhoids without mention of complication   . History of kidney stones   . Tubulovillous adenoma  polyp of colon     2010  . HOH (hard of hearing)     hearing aids  . Osteoporosis 2016  . Gastroparesis   . Esophageal stricture   . Chronic edema   . Carotid artery disease ([place])     a. Duplex 05/2014: 123456 RICA, 123456 LICA, elevated velocities in right subclavian artery, normal left subclavian artery..      Past Surgical History  Procedure Laterality Date  . Angioplasty  2001  . Back surgery      x2, lumbar and cervical  . Tonsillectomy    . Cataract extraction Bilateral 12/12  . Shoulder surgery Left 05/07/2009  . Cardiac catheterization    . Lumbar laminectomy/decompression microdiscectomy Right 09/14/2012    Procedure: Right Lumbar three-four, four-five, Lumbar five-Sacral one decompressive laminectomy;  Surgeon: [name], MD;  Location: [facility]S;  Service: Neurosurgery;  Laterality: Right;  . Esophagogastroduodenoscopy endoscopy  04/2015    Family History  Problem Relation Age of Onset  . Heart disease Mother   . Heart attack Mother   . Stroke Mother   . Heart disease Father   . Heart attack Father   . Stroke Father   . Breast cancer Sister     Twin   . Heart attack Sister   . Dementia Sister   . Heart disease Sister   . Heart attack Sister   . Clotting disorder Sister   . Heart attack Sister   . Colon cancer Neg Hx     Social History:  Married. Used to work for [employer]-- retired at age 77.  Independent PTA. He reports that he has never smoked. He has never used smokeless tobacco. He reports that he does not drink alcohol or use illicit drugs.    Allergies  Allergen Reactions  . Oxycodone-Acetaminophen Other (See Comments)    "too strong"  . Phenergan [Promethazine Hcl]     Eye swelling  . Sulfamethoxazole Rash and Itching    Medications Prior to Admission  Medication Sig Dispense Refill  . brimonidine-timolol (COMBIGAN) 0.2-0.5 % ophthalmic solution Place 1 drop into both eyes every 12 (twelve) hours.    Marland Kitchen esomeprazole (NEXIUM) 40 MG capsule Take 1 capsule (40 mg total) by mouth 2 (two) times daily. (Patient taking differently: Take 40 mg by mouth every morning. ) 60 capsule 6  . furosemide (LASIX) 20 MG tablet Take 20 mg by mouth daily.     Marland Kitchen imatinib (GLEEVEC) 400 MG tablet Take 400 mg by mouth daily with lunch.     . isosorbide mononitrate (IMDUR) 30  MG 24 hr tablet TAKE 1 TABLET EVERY DAY 90 tablet 3  . loperamide (IMODIUM) 2 MG capsule Take 2 mg by mouth every morning. For diarrhea    . LORazepam (ATIVAN) 0.5 MG tablet Take 0.5 mg by mouth 3 times/day as needed-between meals & bedtime for sleep.    . metoprolol succinate (TOPROL-XL) 25 MG 24 hr tablet TAKE 1 TABLET EVERY DAY 90 tablet 3  . Multiple Vitamin (MULTIVITAMIN) tablet Take 1 tablet by mouth daily.    . nitroGLYCERIN (NITROSTAT) 0.4 MG SL tablet Place 1 tablet (0.4 mg total) under the tongue every 5 (five) minutes x 3 doses as needed for chest pain. 90 tablet 3  . predniSONE (DELTASONE) 5 MG tablet Take 1 tablet (5 mg total) by mouth daily. 90 tablet 1  . pyridostigmine (MESTINON) 60 MG tablet Take 1 tablet (60 mg total) by mouth 3 (three) times daily. [place]  tablet 5  . rosuvastatin (CRESTOR) 10 MG tablet TAKE 1/2 TABLETS BY MOUTH DAILY. 45 tablet 0  . warfarin (COUMADIN) 5 MG tablet Take 2.5-5 mg by mouth daily. Saturday and Wednesday take 5mg , all other days take 2.5mg .      Home: Home Living Family/patient expects to be discharged to:: Inpatient rehab Living Arrangements: Spouse/significant other Available Help at Discharge: Family, Available 24 hours/day Type of Home: House Home Access: Stairs to enter [employer] of Steps: 3 Entrance Stairs-Rails: Right Home Layout: Two level, Bed/bath upstairs Alternate Level Stairs-Number of Steps: 9 steps; 1 flight to get to basement Alternate Level Stairs-Rails: Right, Left Bathroom Shower/Tub: Multimedia programmer: Associate Professor Accessibility: Yes Home Equipment: Shower seat - built in Additional Comments: pt has walk-in shower   Lives With: Spouse   Functional History: Prior Function Level of Independence: Independent (prior to this recent episode) Comments: very active, likes to garden  Functional Status:  Mobility: Bed Mobility Overal bed mobility: Needs Assistance Bed Mobility: Supine to Sit,  Sit to Supine Supine to sit: Supervision Sit to supine: Supervision General bed mobility comments: Use of rail. + dizziness getting to EOB. no assist needed. Slow. performed x2. Transfers Overall transfer level: Needs assistance Equipment used: None Transfers: Sit to/from Stand Sit to Stand: Min assist General transfer comment: Min A to steady pt in standing. Sway noted due to dizziness. Stood from [employer]. Cues for hand placement/technique and for slow descent into chair. Ambulation/Gait Ambulation/Gait assistance: Mod assist Ambulation Distance (Feet): 100 Feet (x2 bouts) Assistive device: 1 person hand held assist Gait Pattern/deviations: Step-to pattern, Step-through pattern, Decreased stride length, Staggering right, Staggering left, Shuffle General Gait Details: Cues for fixating gaze during ambulation and esp during turns to decrease dizzy sensation. Balance deficits most notably in busy hallways- walking people, talking etc. Less jerkling like movements today. 1 seated rest brealk. reaching for rail at times for BUE support. Cues to use eyes, turn head and then body for mobility.  Gait velocity: decreased    ADL: ADL Overall ADL's : Needs assistance/impaired Lower Body Dressing: Minimal assistance, Sit to/from stand Toilet Transfer: Minimal assistance, Ambulation, RW (sit to stand from bed) Functional mobility during ADLs: Minimal assistance, Rolling walker General ADL Comments: Encouraged pt to look at one thing to help with dizziness.   Cognition: Cognition Overall Cognitive Status: Within Functional Limits for tasks assessed Orientation Level: Oriented X4 Cognition Arousal/Alertness: Awake/alert Behavior During Therapy: WFL for tasks assessed/performed Overall Cognitive Status: Within Functional Limits for tasks assessed Area of Impairment: Safety/judgement Safety/Judgement: Decreased awareness of safety, Decreased awareness of deficits General Comments: Pt continually  getting up in room with wife despite knowing he is not steay on feet.     Blood pressure 127/61, pulse 58, temperature 97.7 F (36.5 C), temperature source Oral, resp. rate 20, height 5\' 8"  (1.727 m), weight 81.647 kg (180 lb), SpO2 98 %. Physical Exam  Nursing note and vitals reviewed. Constitutional: He is oriented to person, place, and time. He appears well-developed and well-nourished.  HENT:  Head: Normocephalic and atraumatic.  Mouth/Throat: Oropharynx is clear and moist.  Eyes: Pupils are equal, round, and reactive to light. Scleral icterus is present.  Injected Sclera R>L  Neck: Normal range of motion. Neck supple.  Cardiovascular: Normal rate.  An irregularly irregular rhythm present.  Respiratory: Effort normal and breath sounds normal. No stridor. No respiratory distress. He has no wheezes.  GI: Soft. Bowel sounds are normal. He exhibits distension. There is no  tenderness. There is no rebound.  Musculoskeletal: He exhibits no edema or tenderness.  Neurological: He is alert and oriented to person, place, and time. No cranial nerve deficit.  Follows full commands.  Some decreased fine motor skills. DTRs symmetric Sensation intact light touch Motor: Bilateral upper extremities: 4+/5 proximal to distal Bilateral lower extremity proximally 4 -/5, distally 4+/5   Skin: Skin is warm and dry.  Psychiatric: He has a normal mood and affect. His behavior is normal.   No results found for this or any previous visit (from the past 48 hour(s)). Ct Head Wo Contrast  06/23/2015  CLINICAL DATA:  Follow-up infarct. History of hypertension, hyperlipidemia, carotid artery disease, diabetes. EXAM: CT HEAD WITHOUT CONTRAST TECHNIQUE: Contiguous axial images were obtained from the base of the skull through the vertex without intravenous contrast. COMPARISON:  CT HEAD Jun 20, 2015 and MRI head Jun 20, 2015 FINDINGS: INTRACRANIAL CONTENTS: Wedge-like hypodensity LEFT and severe inferior cerebellum is  similar in appearance. No intraparenchymal hemorrhage, mass effect, midline shift. Ventricles and sulci are overall normal for patient's age. Patchy supratentorial white matter hypodensities better characterized on prior MRI compatible with chronic small vessel ischemic disease. No abnormal extra-axial fluid collections. Basal cisterns are patent. Moderate calcific atherosclerosis of the carotid siphons and included vertebral arteries. ORBITS: The included ocular globes and orbital contents are non-suspicious. Status post bilateral ocular lens implants. SINUSES: Moderate RIGHT maxillary sinusitis. Mastoid air cells are well aerated. SKULL/SOFT TISSUES: No skull fracture. No significant soft tissue swelling. 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 Involutional changes. Moderate chronic small vessel ischemic disease. Electronically Signed   By: Elon Alas M.D.   On: 06/23/2015 06:23    Medical Problem List and Plan: 1.  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  2.  H/o PE/Anticoagulation: Pharmaceutical: Other (comment)--coumadin changed to Eliquis.  3. Cervical spondylosis/Achilles tenditinits/Pain Management: Was getting outpatient PT to help with pain and right ankle pain. Tylenol prn 4. Mood: LCSW to monitor for evaluation and support.  5. Neuropsych: This patient is capable of making decisions on his own behalf. 6. Skin/Wound Care: Routine pressure relief measures 7. Fluids/Electrolytes/Nutrition: Monitor I/O. Check lytes in am. Encourage fluid intake to avoid dehydration.  8.  CAD with chronic diastolic CHF-compensated: Monitor for signs of overload. Check weight daily. Low salt diet. Symptoms managed on Imdur, Lasix, metoprolol and Crestor.  9. Myasthenia Gravis: Primarily ocular features--On prednisone and Pyridostigmine 10. CML: On  Gleevec 11. T2DM: Hgb A1C-5.7--well controlled on diet. Will check BS BS ac/hs and use SSI for elevated BS--if stable can d/c cbg checks. .  12. GERD with nausea and postprandial fullness:  Symptoms managed at this time on continue Nexium BID.   13. CKD?: Creatinine at -1.5 earlier this year and metformin d/c.  14. HTN: Monitor with increased activity.  Allow for permissive HTN 15. ABLA: Cont to monitor Hb   Post Admission Physician Evaluation: 1. Functional deficits secondary  to left PICA and cerebellar vermis infarcts. 2. Patient is admitted to receive collaborative, interdisciplinary care between the physiatrist, rehab nursing staff, and therapy team. 3. Patient's level of medical complexity and substantial therapy needs in context of that medical necessity cannot be provided at a lesser intensity of care such as a SNF. 4. Patient has experienced substantial functional loss from his/her baseline which was documented above under the "Functional History" and "Functional Status" headings.  Judging by the  patient's diagnosis, physical exam, and functional history, the patient has potential for functional progress which will result in measurable gains while on inpatient rehab.  These gains will be of substantial and practical use upon discharge  in facilitating mobility and self-care at the household level. 5. Physiatrist will provide 24 hour management of medical needs as well as oversight of the therapy plan/treatment and provide guidance as appropriate regarding the interaction of the two. 6. 24 hour rehab nursing will assist with safety, disease management and patient education and help integrate therapy concepts, techniques,education, etc. 7. PT will assess and treat for/with: Lower extremity strength, range of motion, stamina, balance, functional mobility, safety, adaptive techniques and equipment, woundcare, coping skills, pain control, education.   Goals are: Mod I/Supervision. 8. OT will  assess and treat for/with: ADL's, functional mobility, safety, upper extremity strength, adaptive techniques and equipment, wound mgt, ego support, and community reintegration.   Goals are: Mod I/Supervision. Therapy may proceed with showering this patient. 9. Case Management and Social Worker will assess and treat for psychological issues and discharge planning. 10. Team conference will be held weekly to assess progress toward goals and to determine barriers to discharge. 11. Patient will receive at least 3 hours of therapy per day at least 5 days per week. 12. ELOS: 10-14 days.       13. Prognosis:  good   Delice Lesch, MD  06/24/2015

## 2015-06-24 NOTE — PMR Pre-admission (Signed)
PMR Admission Coordinator Pre-Admission Assessment  Patient: [name] is an 75 y.o., male MRN: [id number] DOB: [birth year] Height: 5\' 8"  (172.7 cm) Weight: 81.647 kg (180 lb)              Insurance Information HMO:     PPO:      PCP:      IPA:      80/20: yes     OTHER:  No HMO PRIMARY: Medicare a and b      Policy#: [id number]      Subscriber: pt Benefits:  Phone #: [email or web address]     Name: 06/24/15 Eff. Date: 04/10/03     Deduct: $1316      Out of Pocket Max: none      Life Max: none CIR: 100%      SNF: 20 full days Outpatient: 80%     Co-Pay: 20% Home Health: 100%      Co-Pay: none DME: 80%     Co-Pay: 20% Providers: pt choice  SECONDARY: [employer] choice care supplement      Policy#: [id number]      Subscriber: pt  Medicaid Application Date:       Case Manager:  Disability Application Date:       Case Worker:   Emergency [place]    Name Relation Home Work Mobile   Veale,[name] Spouse [phone]  [phone]     Current Medical History  Patient Admitting Diagnosis: left PICA and cerebellar cvas  History of Present Illness: [name] is a 75 y.o. right handed male with history of myasthenia gravis on prednisone and pyridostigmine, NIDDM, CAD with angioplasty, pulmonary emboli with chronic Coumadin. Per chart review patient lives with wife. Independent prior to admission and active. ps to entry of home. Patient with recent admission to [facility] 06/17/2015 for dehydration observed for 2 days discharged on 06/19/2015. Presented 06/20/2015 with increased weakness, vertigo, nausea. INR 2.00, troponin negative. 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 Left vertebral artery occlusion. CT angiogram of head and neck showed proximal left V2 vertebral artery occlusion with intermittent distal reconstitution. Echocardiogram with ejection fraction of 123456 grade 1 diastolic  dysfunction. Patient did not receive TPA. Neurology consulted presently on eliquis. Tolerating a regular diet.  NIH Total: 0    Past Medical History  Past Medical History  Diagnosis Date  . Coronary atherosclerosis of unspecified type of vessel, native or graft     a. cath in 2003 showed 10-20% LM, scattered 20% prox-mid LAD, 30% more distal LAD, 50-60% stenosis of LAD towards apex, 20% Cx, 30% dRCA, focal 95% stenosis in smaller of 2 branches of PDA, EF 60-65%.  . Mixed hyperlipidemia   . Glucose intolerance (impaired glucose tolerance)     history of  . Diverticulosis of colon (without mention of hemorrhage)   . Hx of colonic polyps   . Internal hemorrhoids without mention of complication   . Rash and other nonspecific skin eruption   . Conjunctivitis unspecified   . Myasthenia gravis ([place])     uses prednisone  . GERD (gastroesophageal reflux disease)   . Pulmonary embolism [facility]) June 2013    a. Bilateral PE 07/2011.  Marland Kitchen DVT (deep venous thrombosis) [facility]) June 2013  . Chronic anticoagulation   . Diabetes mellitus ([place])   . DJD (degenerative joint disease)   . Ptosis   . 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 Essential  hypertension   . IBS (irritable bowel syndrome)   . History of pneumonia   . Dystonia   . 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 PONV (postoperative nausea and vomiting)   . External hemorrhoids without mention of complication   . History of kidney stones   . Tubulovillous adenoma polyp of colon     2010  . HOH (hard of hearing)     hearing aids  . Osteoporosis 2016  . Gastroparesis   . Esophageal stricture   . Chronic edema   . Carotid artery disease ([place])     a. Duplex 05/2014: 123456 RICA, 123456 LICA, elevated velocities in right subclavian artery, normal left subclavian artery..    Family History  family history includes Breast cancer in his sister; Clotting disorder in his sister; Dementia in his sister; Heart attack in his father, mother,  sister, sister, and sister; Heart disease in his father, mother, and sister; Stroke in his father and mother. There is no history of Colon cancer.  Prior Rehab/Hospitalizations:  Has the patient had major surgery during 100 days prior to admission? No  Current Medications   Current facility-administered medications:  .  apixaban (ELIQUIS) tablet 5 mg, 5 mg, Oral, BID, Erenest Blank, RPH, 5 mg at 06/24/15 1042 .  brimonidine (ALPHAGAN) 0.2 % ophthalmic solution 1 drop, 1 drop, Both Eyes, Q12H, 1 drop at 06/24/15 1043 **AND** timolol (TIMOPTIC) 0.5 % ophthalmic solution 1 drop, 1 drop, Both Eyes, Q12H, Edwin Dada, MD, 1 drop at 06/24/15 1043 .  feeding supplement (ENSURE ENLIVE) (ENSURE ENLIVE) liquid 237 mL, 237 mL, Oral, BID BM, Bonnell Public, MD, 237 mL at 06/24/15 1339 .  furosemide (LASIX) tablet 20 mg, 20 mg, Oral, Daily, Rosalin Hawking, MD, 20 mg at 06/24/15 1042 .  imatinib (GLEEVEC) tablet 400 mg, 400 mg, Oral, Q lunch, Rosalin Hawking, MD, 400 mg at 06/23/15 1200 .  isosorbide mononitrate (IMDUR) 24 hr tablet 30 mg, 30 mg, Oral, Daily, Rosalin Hawking, MD, 30 mg at 06/24/15 1042 .  lisinopril (PRINIVIL,ZESTRIL) tablet 10 mg, 10 mg, Oral, Daily, Bonnell Public, MD, 10 mg at 06/24/15 1042 .  loperamide (IMODIUM) capsule 2 mg, 2 mg, Oral, BH-q7a, Rosalin Hawking, MD, 2 mg at 06/24/15 0649 .  LORazepam (ATIVAN) tablet 0.5 mg, 0.5 mg, Oral, BID BM & HS PRN, Rosalin Hawking, MD, 0.5 mg at 06/23/15 2343 .  metoprolol succinate (TOPROL-XL) 24 hr tablet 25 mg, 25 mg, Oral, Daily, Rosalin Hawking, MD, 25 mg at 06/24/15 1042 .  multivitamin with minerals tablet 1 tablet, 1 tablet, Oral, Daily, Rosalin Hawking, MD, 1 tablet at 06/24/15 1042 .  nitroGLYCERIN (NITROSTAT) SL tablet 0.4 mg, 0.4 mg, Sublingual, Q5 Min x 3 PRN, Rosalin Hawking, MD .  pantoprazole (PROTONIX) EC tablet 40 mg, 40 mg, Oral, Daily, Rosalin Hawking, MD, 40 mg at 06/24/15 1042 .  predniSONE (DELTASONE) tablet 5 mg, 5 mg, Oral, Daily, Rosalin Hawking, MD,  5 mg at 06/24/15 1042 .  pyridostigmine (MESTINON) tablet 60 mg, 60 mg, Oral, TID, Rosalin Hawking, MD, 60 mg at 06/24/15 1043 .  rosuvastatin (CRESTOR) tablet 5 mg, 5 mg, Oral, Daily, Rosalin Hawking, MD, 5 mg at 06/24/15 1042  Patients Current Diet: Diet Heart Room service appropriate?: Yes; Fluid consistency:: Thin  Precautions / Restrictions Precautions Precautions: Fall Precaution Comments: vertigo Restrictions Weight Bearing Restrictions: No   Has the patient had 2 or more falls or a fall with injury in the past year?No  Prior  Activity Level Community (5-7x/wk): Independent and driving pta  Development worker, international aid / [place] Devices/Equipment: Eyeglasses Home Equipment: Shower seat - built in  Prior Device Use: Indicate devices/aids used by the patient prior to current illness, exacerbation or injury? None of the above  Prior Functional Level Prior Function Level of Independence: Independent (prior to this recent episode) Comments: very active, likes to garden  Self Care: Did the patient need help bathing, dressing, using the toilet or eating?  Independent  Indoor Mobility: Did the patient need assistance with walking from room to room (with or without device)? Independent  Stairs: Did the patient need assistance with internal or external stairs (with or without device)? Independent  Functional Cognition: Did the patient need help planning regular tasks such as shopping or remembering to take medications? Independent  Current Functional Level Cognition  Overall Cognitive Status: Within Functional Limits for tasks assessed Orientation Level: Oriented X4 Safety/Judgement: Decreased awareness of safety, Decreased awareness of deficits General Comments: Pt continually getting up in room with wife despite knowing he is not steay on feet.     Extremity Assessment (includes Sensation/Coordination)  Upper Extremity Assessment: RUE deficits/detail, LUE  deficits/detail RUE Deficits / Details: weakness in shoulder flexors RUE Coordination: decreased fine motor, decreased gross motor LUE Deficits / Details: weakness in shoulder flexors (left weaker than right); history of shoulder surgery; weak grip strength LUE Coordination: decreased fine motor, decreased gross motor  Lower Extremity Assessment: Defer to PT evaluation RLE Sensation:  [facility]) RLE Coordination: decreased fine motor LLE Deficits / Details: Difficulty performing heel to shin LLE Sensation:  (WFL) LLE Coordination: decreased fine motor    ADLs  Overall ADL's : Needs assistance/impaired Lower Body Dressing: Minimal assistance, Sit to/from stand Toilet Transfer: Minimal assistance, Ambulation, RW (sit to stand from bed) Functional mobility during ADLs: Minimal assistance, Rolling walker General ADL Comments: Encouraged pt to look at one thing to help with dizziness.     Mobility  Overal bed mobility: Needs Assistance Bed Mobility: Supine to Sit, Sit to Supine Supine to sit: Supervision Sit to supine: Supervision General bed mobility comments: Use of rail. + dizziness getting to EOB. no assist needed. Slow. performed x2.    Transfers  Overall transfer level: Needs assistance Equipment used: None Transfers: Sit to/from Stand Sit to Stand: Min assist General transfer comment: Min A to steady pt in standing. Sway noted due to dizziness. Stood from [employer]. Cues for hand placement/technique and for slow descent into chair.    Ambulation / Gait / Stairs / Wheelchair Mobility  Ambulation/Gait Ambulation/Gait assistance: Mod assist Ambulation Distance (Feet): 100 Feet (x2 bouts) Assistive device: 1 person hand held assist Gait Pattern/deviations: Step-to pattern, Step-through pattern, Decreased stride length, Staggering right, Staggering left, Shuffle General Gait Details: Cues for fixating gaze during ambulation and esp during turns to decrease dizzy sensation. Balance  deficits most notably in busy hallways- walking people, talking etc. Less jerkling like movements today. 1 seated rest brealk. reaching for rail at times for BUE support. Cues to use eyes, turn head and then body for mobility.  Gait velocity: decreased    Posture / Balance Dynamic Sitting Balance Sitting balance - Comments: Able to perform testing of AROM BLEs sitting EOB with posterior lean but no LOB. Balance Overall balance assessment: Needs assistance Sitting-balance support: Feet supported, No upper extremity supported Sitting balance-Leahy Scale: Good Sitting balance - Comments: Able to perform testing of AROM BLEs sitting EOB with posterior lean but no LOB. Standing  balance support: During functional activity Standing balance-Leahy Scale: Fair Standing balance comment: Requires UE support in standing. Practiced looking out big window and focusing on things in environment with head movements.    Special needs/care consideration Bowel mgmt: continent Bladder mgmt:continent Diabetic mgmt Hgb A1c 5.7 Bed and chair alarm needed for pt decreased safety awareness     Previous Home Environment Living Arrangements: Spouse/significant other  Lives With: Spouse Available Help at Discharge: Family, Available 24 hours/day Type of Home: House Home Layout: Two level, Bed/bath upstairs Alternate Level Stairs-Rails: Right, Left Alternate Level Stairs-Number of Steps: 9 steps; 1 flight to get to basement Home Access: Stairs to enter Entrance Stairs-Rails: Right Entrance Stairs-Number of Steps: 3 Bathroom Shower/Tub: Multimedia programmer: Standard Bathroom Accessibility: Yes How Accessible: Accessible via walker [place]: No Additional Comments: pt has walk-in shower   Discharge Living Setting Plans for Discharge Living Setting: Patient's home, Lives with (comment) (spouse) Type of Home at Discharge: House Discharge Home Layout: Two level, Bed/bath  upstairs Alternate Level Stairs-Rails:  (9 steps, one flight to basement) Discharge Home Access: Stairs to enter Entrance Stairs-Rails: Right Entrance Stairs-Number of Steps: 3 Discharge Bathroom Shower/Tub: Walk-in shower Discharge Bathroom Toilet: Standard Discharge Bathroom Accessibility: Yes How Accessible: Accessible via walker Does the patient have any problems obtaining your medications?: No  Social/Family/Support Systems Patient Roles: Spouse, Parent Contact Information: Corliss Marcus, wife Anticipated Caregiver: wife Anticipated Caregiver's Contact Information: see above Ability/Limitations of Caregiver: wife retired 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 Discharge Plan Discussed with Primary Caregiver: Yes Is Caregiver In Agreement with Plan?: Yes Does Caregiver/Family have Issues with Lodging/Transportation while Pt is in Rehab?: No   Goals/Additional Needs Patient/Family Goal for Rehab: supervision with PT and OT Expected length of stay: ELOS 10- 14 days Special Service Needs: pt on oral chemo for CML since 2004 Pt/Family Agrees to Admission and willing to participate: Yes Program Orientation Provided & Reviewed with Pt/Caregiver Including Roles  & Responsibilities: Yes   Decrease burden of Care through IP rehab admission: n/a  Possible need for SNF placement upon discharge:not anticipated  Patient Condition: This patient's condition remains as documented in the consult dated 06/24/2015, in which the Rehabilitation Physician determined and documented that the patient's condition is appropriate for intensive rehabilitative care in an inpatient rehabilitation facility. Will admit to inpatient rehab today.  Preadmission Screen Completed By:  Cleatrice Burke, 06/24/2015 3:22 PM ______________________________________________________________________   Discussed status with Dr. Posey Pronto on 06/24/15 at 1522  and received telephone approval for admission today.  Admission  Coordinator:  Cleatrice Burke, time [id number] Date 06/24/2015

## 2015-06-24 NOTE — Progress Notes (Signed)
Initial Nutrition Assessment   INTERVENTION:  Provide Ensure Enlive po BID, each supplement provides 350 kcal and 20 grams of protein Provide Snacks BID (10 AM and HS) Provide Multivitamin with minerals daily   NUTRITION DIAGNOSIS:   Inadequate oral intake related to poor appetite, nausea as evidenced by per patient/family report.   GOAL:   Patient will meet greater than or equal to 90% of their needs   MONITOR:   PO intake, Supplement acceptance, Labs, Weight trends, Skin, I & O's  REASON FOR ASSESSMENT:   Malnutrition Screening Tool    ASSESSMENT:   75 y.o. male with a past medical history significant for myasthenia gravis on prednisone and pyridostigmine, diffuse CAD on OMT, history of PE on warfarin, CML on Gleevec, and NIDDM who presents with vertigo  Pt states that he has had a very poor appetite for the past week and has been eating 50% less than usual. Reports occasional nausea and some changes in appetite due to chemo. He reports a usual body weight of 180 lbs; states that fluid may be masking weight loss. +1 RLE and LLE edema per nursing notes. He reports eating about 50% of meals since admission. Pt appears well-nourished per nutrition-focused physical exam. Pt agreeable to receiving Ensure and snacks to supplement poor PO.   Labs: low HDL, low calcium, low hemoglobin  Diet Order:  Diet Heart Room service appropriate?: Yes; Fluid consistency:: Thin  Skin:  Reviewed, no issues  Last BM:  5/14  Height:   Ht Readings from Last 1 Encounters:  06/23/15 5\' 8"  (1.727 m)    Weight:   Wt Readings from Last 1 Encounters:  06/23/15 180 lb (81.647 kg)    Ideal Body Weight:  70 kg  BMI:  Body mass index is 27.38 kg/(m^2).  Estimated Nutritional Needs:   Kcal:  1900-2100  Protein:  95-105 grams  Fluid:  1.9-2.1 L/day  EDUCATION NEEDS:   No education needs identified at this time  [place], LDN Inpatient Clinical Dietitian Pager:  [phone] After Hours Pager: [phone]

## 2015-06-24 NOTE — Interval H&P Note (Signed)
[name] was admitted today to Inpatient Rehabilitation with the diagnosis of left PICA and cerebellar vermis infarcts.  The patient's history has been reviewed, patient examined, and there is no change in status.  Patient continues to be appropriate for intensive inpatient rehabilitation.  I have reviewed the patient's chart and labs.  Questions were answered to the patient's satisfaction. The PAPE has been reviewed and assessment remains appropriate.  Ankit Lorie Phenix 06/24/2015, 9:36 PM

## 2015-06-24 NOTE — Progress Notes (Signed)
Ankit Lorie Phenix, MD Physician Signed Physical Medicine and Rehabilitation Consult Note 06/24/2015 6:14 AM  Related encounter: ED to Hosp-Admission (Current) from 06/19/2015 in [place] All Collapse All        Physical Medicine and Rehabilitation Consult Reason for Consult: Left PICA and cerebellar vermis infarcts Referring Physician: [facility]   HPI: [name] is a 75 y.o. right handed male with history of myasthenia gravis on prednisone and pyridostigmine, NIDDM, CAD with angioplasty, pulmonary emboli with chronic Coumadin. Per chart review patient lives with wife. Independent prior to admission and active. Two-level home with bedroom upstairs. 3 steps to entry of home. Patient with recent admission to [facility] 06/17/2015 for dehydration observed for 2 days discharged on 06/19/2015. Presented 06/20/2015 with increased weakness, vertigo, nausea. INR 2.00, troponin negative. 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 Left vertebral artery occlusion. CT angiogram of head and neck showed proximal left V2 vertebral artery occlusion with intermittent distal reconstitution. Echocardiogram with ejection fraction of 123456 grade 1 diastolic dysfunction. Patient did not receive TPA. Neurology consulted presently on eliquis. Tolerating a regular diet. Physical and occupational therapy evaluations completed with recommendations of physical medicine rehabilitation consult.  Review of Systems  Constitutional: Negative for fever and chills.  Eyes: Negative for blurred vision and double vision.  Respiratory: Negative for cough.   Occasional shortness of breath with exertion  Cardiovascular: Negative for chest pain.  Gastrointestinal: Positive for constipation. Negative for nausea and vomiting.   GERD  Genitourinary: Positive for urgency. Negative for  dysuria and hematuria.  Skin: Negative for rash.  Neurological: Positive for weakness. Negative for speech change, seizures and loss of consciousness.   Vertigo  Psychiatric/Behavioral:   Anxiety  All other systems reviewed and are negative.  Past Medical History  Diagnosis Date  . Coronary atherosclerosis of unspecified type of vessel, native or graft     a. cath in 2003 showed 10-20% LM, scattered 20% prox-mid LAD, 30% more distal LAD, 50-60% stenosis of LAD towards apex, 20% Cx, 30% dRCA, focal 95% stenosis in smaller of 2 branches of PDA, EF 60-65%.  . Mixed hyperlipidemia   . Glucose intolerance (impaired glucose tolerance)     history of  . Diverticulosis of colon (without mention of hemorrhage)   . Hx of colonic polyps   . Internal hemorrhoids without mention of complication   . Rash and other nonspecific skin eruption   . Conjunctivitis unspecified   . Myasthenia gravis ([place])     uses prednisone  . GERD (gastroesophageal reflux disease)   . Pulmonary embolism [facility]) June 2013    a. Bilateral PE 07/2011.  Marland Kitchen DVT (deep venous thrombosis) [facility]) June 2013  . Chronic anticoagulation   . Diabetes mellitus ([place])   . DJD (degenerative joint disease)   . Ptosis   . 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 Essential hypertension   . IBS (irritable bowel syndrome)   . History of pneumonia   . Dystonia   . 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 PONV (postoperative nausea and vomiting)   . External hemorrhoids without mention of complication   . History of kidney stones   . Tubulovillous adenoma polyp of colon     2010  . HOH (hard of hearing)     hearing aids  . Osteoporosis 2016  . Gastroparesis   . Esophageal stricture   .  Chronic edema   . Carotid artery disease ([place])     a. Duplex 05/2014: 123456 RICA, 123456 LICA, elevated velocities in  right subclavian artery, normal left subclavian artery..   Past Surgical History  Procedure Laterality Date  . Angioplasty  2001  . Back surgery      x2, lumbar and cervical  . Tonsillectomy    . Cataract extraction Bilateral 12/12  . Shoulder surgery Left 05/07/2009  . Cardiac catheterization    . Lumbar laminectomy/decompression microdiscectomy Right 09/14/2012    Procedure: Right Lumbar three-four, four-five, Lumbar five-Sacral one decompressive laminectomy; Surgeon: [name], MD; Location: [place] [facility]; Service: Neurosurgery; Laterality: Right;  . Esophagogastroduodenoscopy endoscopy  04/2015   Family History  Problem Relation Age of Onset  . Heart disease Mother   . Heart attack Mother   . Stroke Mother   . Heart disease Father   . Heart attack Father   . Stroke Father   . Breast cancer Sister     Twin   . Heart attack Sister   . Dementia Sister   . Heart disease Sister   . Heart attack Sister   . Clotting disorder Sister   . Heart attack Sister   . Colon cancer Neg Hx    Social History:  reports that he has never smoked. He has never used smokeless tobacco. He reports that he does not drink alcohol or use illicit drugs. Allergies:  Allergies  Allergen Reactions  . Oxycodone-Acetaminophen Other (See Comments)    "too strong"  . Phenergan [Promethazine Hcl]     Eye swelling  . Sulfamethoxazole Rash and Itching   Medications Prior to Admission  Medication Sig Dispense Refill  . brimonidine-timolol (COMBIGAN) 0.2-0.5 % ophthalmic solution Place 1 drop into both eyes every 12 (twelve) hours.    Marland Kitchen esomeprazole (NEXIUM) 40 MG capsule Take 1 capsule (40 mg total) by mouth 2 (two) times daily. (Patient taking differently: Take 40 mg by mouth every morning. ) 60 capsule 6  . furosemide (LASIX) 20 MG tablet Take 20 mg by mouth  daily.     Marland Kitchen imatinib (GLEEVEC) 400 MG tablet Take 400 mg by mouth daily with lunch.     . isosorbide mononitrate (IMDUR) 30 MG 24 hr tablet TAKE 1 TABLET EVERY DAY 90 tablet 3  . loperamide (IMODIUM) 2 MG capsule Take 2 mg by mouth every morning. For diarrhea    . LORazepam (ATIVAN) 0.5 MG tablet Take 0.5 mg by mouth 3 times/day as needed-between meals & bedtime for sleep.    . metoprolol succinate (TOPROL-XL) 25 MG 24 hr tablet TAKE 1 TABLET EVERY DAY 90 tablet 3  . Multiple Vitamin (MULTIVITAMIN) tablet Take 1 tablet by mouth daily.    . nitroGLYCERIN (NITROSTAT) 0.4 MG SL tablet Place 1 tablet (0.4 mg total) under the tongue every 5 (five) minutes x 3 doses as needed for chest pain. 90 tablet 3  . predniSONE (DELTASONE) 5 MG tablet Take 1 tablet (5 mg total) by mouth daily. 90 tablet 1  . pyridostigmine (MESTINON) 60 MG tablet Take 1 tablet (60 mg total) by mouth 3 (three) times daily. 90 tablet 5  . rosuvastatin (CRESTOR) 10 MG tablet TAKE 1/2 TABLETS BY MOUTH DAILY. 45 tablet 0  . warfarin (COUMADIN) 5 MG tablet Take 2.5-5 mg by mouth daily. Saturday and Wednesday take 5mg , all other days take 2.5mg .      Home: Home Living Family/patient expects to be discharged to:: Inpatient rehab Living Arrangements:  Spouse/significant other Available Help at Discharge: Family, Available 24 hours/day Type of Home: House Home Access: Stairs to enter [employer] of Steps: 3 Entrance Stairs-Rails: Right Home Layout: Two level, Bed/bath upstairs Alternate Level Stairs-Number of Steps: 9 steps; 1 flight to get to basement Alternate Level Stairs-Rails: Right, Left Home Equipment: Shower seat - built in Additional Comments: pt has walk-in shower   Functional History: Prior Function Level of Independence: Independent (prior to this recent episode) Comments: very active, likes to garden Functional Status:  Mobility: Bed  Mobility Overal bed mobility: Needs Assistance Bed Mobility: Supine to Sit, Sit to Supine Supine to sit: Supervision Sit to supine: Supervision General bed mobility comments: Use of rail. + dizziness getting to EOB. no assist needed. Slow. Transfers Overall transfer level: Needs assistance Equipment used: Rolling walker (2 wheeled) Transfers: Sit to/from Stand Sit to Stand: Min assist General transfer comment: Min A to steady pt in standing. Stood from [employer]. Cues for hand placement/technique. Ambulation/Gait Ambulation/Gait assistance: Mod assist Ambulation Distance (Feet): 100 Feet Assistive device: Rolling walker (2 wheeled), None Gait Pattern/deviations: Step-through pattern, Shuffle, Decreased stride length, Staggering right, Staggering left General Gait Details: Ambulating with RW for support; emphasized gaze stabilization for turning and for ambulation in busy hallway. Progressed to ambulation with rail and HHA for support. Cues to decrease speed. Weakness noted in BLEs as jerking like movements. Cues to turn eyes, head and then body to decrease dizzy sensation. Gait velocity: decreased    ADL: ADL Overall ADL's : Needs assistance/impaired Lower Body Dressing: Minimal assistance, Sit to/from stand Toilet Transfer: Minimal assistance, Ambulation, RW (sit to stand from bed) Functional mobility during ADLs: Minimal assistance, Rolling walker General ADL Comments: Encouraged pt to look at one thing to help with dizziness.   Cognition: Cognition Overall Cognitive Status: Impaired/Different from baseline Orientation Level: Oriented X4 Cognition Arousal/Alertness: Awake/alert Behavior During Therapy: WFL for tasks assessed/performed Overall Cognitive Status: Impaired/Different from baseline Area of Impairment: Safety/judgement Safety/Judgement: Decreased awareness of safety, Decreased awareness of deficits General Comments: Pt continually getting up in room with wife despite  knowing he is not steay on feet.   Blood pressure 134/75, pulse 58, temperature 97.9 F (36.6 C), temperature source Oral, resp. rate 20, height 5\' 8"  (1.727 m), weight 81.647 kg (180 lb), SpO2 98 %. Physical Exam  Vitals reviewed. Constitutional: He is oriented to person, place, and time. He appears well-developed and well-nourished.  HENT:  Head: Normocephalic and atraumatic.  Eyes: EOM are normal.  Injected sclera R>L  Neck: Normal range of motion. Neck supple. No thyromegaly present.  Cardiovascular: Normal rate and regular rhythm.  Respiratory: Effort normal and breath sounds normal. No respiratory distress.  GI: Soft. Bowel sounds are normal. He exhibits no distension.  Musculoskeletal: He exhibits no edema or tenderness.  Neurological: He is alert and oriented to person, place, and time.  Follows full commands.  Some decreased fine motor skills. DTRs symmetric Sensation intact light touch Motor: Bilateral upper extremities: 4+/5 proximal to distal Bilateral lower extremity proximally 4 -/5, distally 4+/5  Skin: Skin is warm and dry.  Psychiatric: He has a normal mood and affect. His behavior is normal.     Lab Results Last 24 Hours    No results found for this or any previous visit (from the past 24 hour(s)).    Imaging Results (Last 48 hours)    Ct Head Wo Contrast  06/23/2015 CLINICAL DATA: Follow-up infarct. History of hypertension, hyperlipidemia, carotid artery disease, diabetes. EXAM: CT  HEAD WITHOUT CONTRAST TECHNIQUE: Contiguous axial images were obtained from the base of the skull through the vertex without intravenous contrast. COMPARISON: CT HEAD Jun 20, 2015 and MRI head Jun 20, 2015 FINDINGS: INTRACRANIAL CONTENTS: Wedge-like hypodensity LEFT and severe inferior cerebellum is similar in appearance. No intraparenchymal hemorrhage, mass effect, midline shift. Ventricles and sulci are overall normal for patient's age. Patchy supratentorial white matter  hypodensities better characterized on prior MRI compatible with chronic small vessel ischemic disease. No abnormal extra-axial fluid collections. Basal cisterns are patent. Moderate calcific atherosclerosis of the carotid siphons and included vertebral arteries. ORBITS: The included ocular globes and orbital contents are non-suspicious. Status post bilateral ocular lens implants. SINUSES: Moderate RIGHT maxillary sinusitis. Mastoid air cells are well aerated. SKULL/SOFT TISSUES: No skull fracture. No significant soft tissue swelling. 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 Involutional changes. Moderate chronic small vessel ischemic disease. Electronically Signed By: Elon Alas M.D. On: 06/23/2015 06:23     Assessment/Plan: Diagnosis: Left PICA and cerebellar vermis infarcts Labs and images independently reviewed. Records reviewed and summated above. Stroke: Continue secondary stroke prophylaxis and Risk Factor Modification listed below:  Antiplatelet therapy:  Blood Pressure Management: Continue current medication with prn's with permisive HTN per primary team Pre-diabetes Statin Agent:   1. Does the need for close, 24 hr/day medical supervision in concert with the patient's rehab needs make it unreasonable for this patient to be served in a less intensive setting? Yes  2. Co-Morbidities requiring supervision/potential complications: myasthenia gravis (cont meds, intermittent therapies), NIDDM (Monitor in accordance with exercise and adjust meds as necessary), CAD (cont meds), pulmonary emboli (cont meds), HTN (monitor and provide prns in accordance with increased physical exertion and pain), ABLA (transfuse if necessary to ensure appropriate perfusion for increased activity tolerance) 3. Due to safety, disease management and patient education, does the patient require 24 hr/day rehab  nursing? Yes 4. Does the patient require coordinated care of a physician, rehab nurse, PT (1-2 hrs/day, 5 days/week) and OT (1-2 hrs/day, 5 days/week) to address physical and functional deficits in the context of the above medical diagnosis(es)? Yes Addressing deficits in the following areas: balance, endurance, locomotion, strength, transferring, dressing, toileting and psychosocial support 5. Can the patient actively participate in an intensive therapy program of at least 3 hrs of therapy per day at least 5 days per week? Yes 6. The potential for patient to make measurable gains while on inpatient rehab is excellent 7. Anticipated functional outcomes upon discharge from inpatient rehab are modified independent and supervision with PT, modified independent and supervision with OT, n/a with SLP. 8. Estimated rehab length of stay to reach the above functional goals is: 10-14 days. 9. Does the patient have adequate social supports and living environment to accommodate these discharge functional goals? Yes 10. Anticipated D/C setting: Home 11. Anticipated post D/C treatments: HH therapy and Home excercise program 12. Overall Rehab/Functional Prognosis: good  RECOMMENDATIONS: This patient's condition is appropriate for continued rehabilitative care in the following setting: CIR Patient has agreed to participate in recommended program. Yes Note that insurance prior authorization may be required for reimbursement for recommended care.  Comment: Rehab Admissions Coordinator to follow up.  Delice Lesch, MD 06/24/2015       Revision History     Date/Time User Provider Type Action   06/24/2015 2:03 PM Ankit Lorie Phenix, MD Physician Sign   06/24/2015 6:46 AM Cathlyn Parsons, PA-C Physician Assistant [place]  View Details Report       Routing History     Date/Time From To Method   06/24/2015 2:03 PM Ankit Lorie Phenix, MD Renato Shin, MD In Basket

## 2015-06-24 NOTE — Telephone Encounter (Signed)
See note below to be advised. 

## 2015-06-24 NOTE — Progress Notes (Signed)
ANTICOAGULATION CONSULT NOTE - Follow Up Consult  Pharmacy Consult for Apixaban Indication: Hx DVT/PE and stroke  Patient Measurements: Height: 5\' 8"  (172.7 cm) Weight: 180 lb (81.647 kg) IBW/kg (Calculated) : 68.4  Labs: No results for input(s): HGB, HCT, PLT, APTT, LABPROT, INR, HEPARINUNFRC, HEPRLOWMOCWT, CREATININE, CKTOTAL, CKMB, TROPONINI in the last 72 hours.  Estimated Creatinine Clearance: 52.5 mL/min (by C-G formula based on Cr of 1.14).   Assessment: 6 YOM on warfarin PTA admitted with acute CVA now stable on Apixaban.   Goal of Therapy:  Monitor platelets by anticoagulation protocol: Yes   Plan:  -Apixaban 5 mg BID -Pharmacy to sign off   Albertina Parr, PharmD., BCPS Clinical Pharmacist Pager [phone]

## 2015-06-24 NOTE — H&P (Signed)
Physical Medicine and Rehabilitation Admission H&P    Chief Complaint  Patient presents with  . Dizziness and problems walking.     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 He presented to [facility] ED 5/10 for work up and evaluation.   INR at admission 2 and MRI brain done revealing acute stroke involving inferior left cerebellum. CTA head and neck showed acute/early subacute left cerebellar infarct, proximal left V2 vertebral artery occlusion with intermittent distal reconstitution and patent dominant R-VA.  2D echo with EF 55-60% with no regional wall abnormality, elevated filling pressures and grade 1 diastolic dysfunction.  Neurology felt that stroke etiology could be cardioembolic due to MAT on exam v/s large vessel disease due to chronic L-VA occlusion with intermittent reconstitution.  Follow up CT head 05/14 showed evolving acute left PICA infarct without propagation or hemorrhagic conversion.  Coumadin changed to Eliquis and patient to follow up with Dr. Jannifer Franklin after discharge. Therapy ongoing and patient limited by dizziness due to gaze instability, nausea and BLE weakness with instability. CIR recommended by rehab team.      Review of Systems  Constitutional: Positive for malaise/fatigue.  HENT: Negative for hearing loss.   Eyes: Negative for blurred vision, double vision and discharge.  Respiratory: Negative for cough, shortness of breath and stridor.   Cardiovascular: Negative for chest pain and palpitations.  Gastrointestinal: Positive for heartburn, nausea and diarrhea (chronic issues). Negative for abdominal pain and constipation.  Genitourinary: Negative for dysuria, urgency and frequency.  Musculoskeletal: Positive  for myalgias and neck pain.  Skin: Negative for itching and rash.  Neurological: Positive for dizziness and headaches. Negative for sensory change and speech change.  Psychiatric/Behavioral: Negative for depression. The patient is nervous/anxious and has insomnia.   All other systems reviewed and are negative.     Past Medical History  Diagnosis Date  . Coronary atherosclerosis of unspecified type of vessel, native or graft     a. cath in 2003 showed 10-20% LM, scattered 20% prox-mid LAD, 30% more distal LAD, 50-60% stenosis of LAD towards apex, 20% Cx, 30% dRCA, focal 95% stenosis in smaller of 2 branches of PDA, EF 60-65%.  . Mixed hyperlipidemia   . Glucose intolerance (impaired glucose tolerance)     history of  . Diverticulosis of colon (without mention of hemorrhage)   . Hx of colonic polyps   . Internal hemorrhoids without mention of complication   . Rash and other nonspecific skin eruption   . Conjunctivitis unspecified   . Myasthenia gravis ([place])     uses prednisone  . GERD (gastroesophageal reflux disease)   . Pulmonary embolism [facility]) June 2013    a. Bilateral PE 07/2011.  Marland Kitchen DVT (deep venous thrombosis) [facility]) June 2013  . Chronic anticoagulation   . Diabetes mellitus ([place])   . DJD (degenerative joint disease)   . Ptosis   . CML (chronic myeloid leukemia) ([place])   . Essential hypertension   . IBS (irritable bowel syndrome)   . History of pneumonia   . Dystonia   . 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 PONV (postoperative nausea and vomiting)   . External hemorrhoids without mention of complication   . History of kidney stones   . Tubulovillous adenoma  polyp of colon     2010  . HOH (hard of hearing)     hearing aids  . Osteoporosis 2016  . Gastroparesis   . Esophageal stricture   . Chronic edema   . Carotid artery disease ([place])     a. Duplex 05/2014: 123456 RICA, 123456 LICA, elevated velocities in right subclavian artery, normal left subclavian artery..      Past Surgical History  Procedure Laterality Date  . Angioplasty  2001  . Back surgery      x2, lumbar and cervical  . Tonsillectomy    . Cataract extraction Bilateral 12/12  . Shoulder surgery Left 05/07/2009  . Cardiac catheterization    . Lumbar laminectomy/decompression microdiscectomy Right 09/14/2012    Procedure: Right Lumbar three-four, four-five, Lumbar five-Sacral one decompressive laminectomy;  Surgeon: [name], MD;  Location: [facility];  Service: Neurosurgery;  Laterality: Right;  . Esophagogastroduodenoscopy endoscopy  04/2015    Family History  Problem Relation Age of Onset  . Heart disease Mother   . Heart attack Mother   . Stroke Mother   . Heart disease Father   . Heart attack Father   . Stroke Father   . Breast cancer Sister     Twin   . Heart attack Sister   . Dementia Sister   . Heart disease Sister   . Heart attack Sister   . Clotting disorder Sister   . Heart attack Sister   . Colon cancer Neg Hx     Social History:  Married. Used to work for [employer]-- retired at age 77.  Independent PTA. He reports that he has never smoked. He has never used smokeless tobacco. He reports that he does not drink alcohol or use illicit drugs.    Allergies  Allergen Reactions  . Oxycodone-Acetaminophen Other (See Comments)    "too strong"  . Phenergan [Promethazine Hcl]     Eye swelling  . Sulfamethoxazole Rash and Itching    Medications Prior to Admission  Medication Sig Dispense Refill  . brimonidine-timolol (COMBIGAN) 0.2-0.5 % ophthalmic solution Place 1 drop into both eyes every 12 (twelve) hours.    Marland Kitchen esomeprazole (NEXIUM) 40 MG capsule Take 1 capsule (40 mg total) by mouth 2 (two) times daily. (Patient taking differently: Take 40 mg by mouth every morning. ) 60 capsule 6  . furosemide (LASIX) 20 MG tablet Take 20 mg by mouth daily.     Marland Kitchen imatinib (GLEEVEC) 400 MG tablet Take 400 mg by mouth daily with lunch.     . isosorbide mononitrate (IMDUR) 30  MG 24 hr tablet TAKE 1 TABLET EVERY DAY 90 tablet 3  . loperamide (IMODIUM) 2 MG capsule Take 2 mg by mouth every morning. For diarrhea    . LORazepam (ATIVAN) 0.5 MG tablet Take 0.5 mg by mouth 3 times/day as needed-between meals & bedtime for sleep.    . metoprolol succinate (TOPROL-XL) 25 MG 24 hr tablet TAKE 1 TABLET EVERY DAY 90 tablet 3  . Multiple Vitamin (MULTIVITAMIN) tablet Take 1 tablet by mouth daily.    . nitroGLYCERIN (NITROSTAT) 0.4 MG SL tablet Place 1 tablet (0.4 mg total) under the tongue every 5 (five) minutes x 3 doses as needed for chest pain. 90 tablet 3  . predniSONE (DELTASONE) 5 MG tablet Take 1 tablet (5 mg total) by mouth daily. 90 tablet 1  . pyridostigmine (MESTINON) 60 MG tablet Take 1 tablet (60 mg total) by mouth 3 (three) times daily. [place]  tablet 5  . rosuvastatin (CRESTOR) 10 MG tablet TAKE 1/2 TABLETS BY MOUTH DAILY. 45 tablet 0  . warfarin (COUMADIN) 5 MG tablet Take 2.5-5 mg by mouth daily. Saturday and Wednesday take 5mg , all other days take 2.5mg .      Home: Home Living Family/patient expects to be discharged to:: Inpatient rehab Living Arrangements: Spouse/significant other Available Help at Discharge: Family, Available 24 hours/day Type of Home: House Home Access: Stairs to enter [employer] of Steps: 3 Entrance Stairs-Rails: Right Home Layout: Two level, Bed/bath upstairs Alternate Level Stairs-Number of Steps: 9 steps; 1 flight to get to basement Alternate Level Stairs-Rails: Right, Left Bathroom Shower/Tub: Multimedia programmer: Associate Professor Accessibility: Yes Home Equipment: Shower seat - built in Additional Comments: pt has walk-in shower   Lives With: Spouse   Functional History: Prior Function Level of Independence: Independent (prior to this recent episode) Comments: very active, likes to garden  Functional Status:  Mobility: Bed Mobility Overal bed mobility: Needs Assistance Bed Mobility: Supine to Sit,  Sit to Supine Supine to sit: Supervision Sit to supine: Supervision General bed mobility comments: Use of rail. + dizziness getting to EOB. no assist needed. Slow. performed x2. Transfers Overall transfer level: Needs assistance Equipment used: None Transfers: Sit to/from Stand Sit to Stand: Min assist General transfer comment: Min A to steady pt in standing. Sway noted due to dizziness. Stood from [employer]. Cues for hand placement/technique and for slow descent into chair. Ambulation/Gait Ambulation/Gait assistance: Mod assist Ambulation Distance (Feet): 100 Feet (x2 bouts) Assistive device: 1 person hand held assist Gait Pattern/deviations: Step-to pattern, Step-through pattern, Decreased stride length, Staggering right, Staggering left, Shuffle General Gait Details: Cues for fixating gaze during ambulation and esp during turns to decrease dizzy sensation. Balance deficits most notably in busy hallways- walking people, talking etc. Less jerkling like movements today. 1 seated rest brealk. reaching for rail at times for BUE support. Cues to use eyes, turn head and then body for mobility.  Gait velocity: decreased    ADL: ADL Overall ADL's : Needs assistance/impaired Lower Body Dressing: Minimal assistance, Sit to/from stand Toilet Transfer: Minimal assistance, Ambulation, RW (sit to stand from bed) Functional mobility during ADLs: Minimal assistance, Rolling walker General ADL Comments: Encouraged pt to look at one thing to help with dizziness.   Cognition: Cognition Overall Cognitive Status: Within Functional Limits for tasks assessed Orientation Level: Oriented X4 Cognition Arousal/Alertness: Awake/alert Behavior During Therapy: WFL for tasks assessed/performed Overall Cognitive Status: Within Functional Limits for tasks assessed Area of Impairment: Safety/judgement Safety/Judgement: Decreased awareness of safety, Decreased awareness of deficits General Comments: Pt continually  getting up in room with wife despite knowing he is not steay on feet.     Blood pressure 127/61, pulse 58, temperature 97.7 F (36.5 C), temperature source Oral, resp. rate 20, height 5\' 8"  (1.727 m), weight 81.647 kg (180 lb), SpO2 98 %. Physical Exam  Nursing note and vitals reviewed. Constitutional: He is oriented to person, place, and time. He appears well-developed and well-nourished.  HENT:  Head: Normocephalic and atraumatic.  Mouth/Throat: Oropharynx is clear and moist.  Eyes: Pupils are equal, round, and reactive to light. Scleral icterus is present.  Injected Sclera R>L  Neck: Normal range of motion. Neck supple.  Cardiovascular: Normal rate.  An irregularly irregular rhythm present.  Respiratory: Effort normal and breath sounds normal. No stridor. No respiratory distress. He has no wheezes.  GI: Soft. Bowel sounds are normal. He exhibits distension. There is no  tenderness. There is no rebound.  Musculoskeletal: He exhibits no edema or tenderness.  Neurological: He is alert and oriented to person, place, and time. No cranial nerve deficit.  Follows full commands.  Some decreased fine motor skills. DTRs symmetric Sensation intact light touch Motor: Bilateral upper extremities: 4+/5 proximal to distal Bilateral lower extremity proximally 4 -/5, distally 4+/5   Skin: Skin is warm and dry.  Psychiatric: He has a normal mood and affect. His behavior is normal.   No results found for this or any previous visit (from the past 48 hour(s)). Ct Head Wo Contrast  06/23/2015  CLINICAL DATA:  Follow-up infarct. History of hypertension, hyperlipidemia, carotid artery disease, diabetes. EXAM: CT HEAD WITHOUT CONTRAST TECHNIQUE: Contiguous axial images were obtained from the base of the skull through the vertex without intravenous contrast. COMPARISON:  CT HEAD Jun 20, 2015 and MRI head Jun 20, 2015 FINDINGS: INTRACRANIAL CONTENTS: Wedge-like hypodensity LEFT and severe inferior cerebellum is  similar in appearance. No intraparenchymal hemorrhage, mass effect, midline shift. Ventricles and sulci are overall normal for patient's age. Patchy supratentorial white matter hypodensities better characterized on prior MRI compatible with chronic small vessel ischemic disease. No abnormal extra-axial fluid collections. Basal cisterns are patent. Moderate calcific atherosclerosis of the carotid siphons and included vertebral arteries. ORBITS: The included ocular globes and orbital contents are non-suspicious. Status post bilateral ocular lens implants. SINUSES: Moderate RIGHT maxillary sinusitis. Mastoid air cells are well aerated. SKULL/SOFT TISSUES: No skull fracture. No significant soft tissue swelling. 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 Involutional changes. Moderate chronic small vessel ischemic disease. Electronically Signed   By: Elon Alas M.D.   On: 06/23/2015 06:23    Medical Problem List and Plan: 1.  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  2.  H/o PE/Anticoagulation: Pharmaceutical: Other (comment)--coumadin changed to Eliquis.  3. Cervical spondylosis/Achilles tenditinits/Pain Management: Was getting outpatient PT to help with pain and right ankle pain. Tylenol prn 4. Mood: LCSW to monitor for evaluation and support.  5. Neuropsych: This patient is capable of making decisions on his own behalf. 6. Skin/Wound Care: Routine pressure relief measures 7. Fluids/Electrolytes/Nutrition: Monitor I/O. Check lytes in am. Encourage fluid intake to avoid dehydration.  8.  CAD with chronic diastolic CHF-compensated: Monitor for signs of overload. Check weight daily. Low salt diet. Symptoms managed on Imdur, Lasix, metoprolol and Crestor.  9. Myasthenia Gravis: Primarily ocular features--On prednisone and Pyridostigmine 10. CML: On  Gleevec 11. T2DM: Hgb A1C-5.7--well controlled on diet. Will check BS BS ac/hs and use SSI for elevated BS--if stable can d/c cbg checks. .  12. GERD with nausea and postprandial fullness:  Symptoms managed at this time on continue Nexium BID.   13. CKD?: Creatinine at -1.5 earlier this year and metformin d/c.  14. HTN: Monitor with increased activity.  Allow for permissive HTN 15. ABLA: Cont to monitor Hb   Post Admission Physician Evaluation: 1. Functional deficits secondary  to left PICA and cerebellar vermis infarcts. 2. Patient is admitted to receive collaborative, interdisciplinary care between the physiatrist, rehab nursing staff, and therapy team. 3. Patient's level of medical complexity and substantial therapy needs in context of that medical necessity cannot be provided at a lesser intensity of care such as a SNF. 4. Patient has experienced substantial functional loss from his/her baseline which was documented above under the "Functional History" and "Functional Status" headings.  Judging by the  patient's diagnosis, physical exam, and functional history, the patient has potential for functional progress which will result in measurable gains while on inpatient rehab.  These gains will be of substantial and practical use upon discharge  in facilitating mobility and self-care at the household level. 5. Physiatrist will provide 24 hour management of medical needs as well as oversight of the therapy plan/treatment and provide guidance as appropriate regarding the interaction of the two. 6. 24 hour rehab nursing will assist with safety, disease management and patient education and help integrate therapy concepts, techniques,education, etc. 7. PT will assess and treat for/with: Lower extremity strength, range of motion, stamina, balance, functional mobility, safety, adaptive techniques and equipment, woundcare, coping skills, pain control, education.   Goals are: Mod I/Supervision. 8. OT will  assess and treat for/with: ADL's, functional mobility, safety, upper extremity strength, adaptive techniques and equipment, wound mgt, ego support, and community reintegration.   Goals are: Mod I/Supervision. Therapy may proceed with showering this patient. 9. Case Management and Social Worker will assess and treat for psychological issues and discharge planning. 10. Team conference will be held weekly to assess progress toward goals and to determine barriers to discharge. 11. Patient will receive at least 3 hours of therapy per day at least 5 days per week. 12. ELOS: 10-14 days.       13. Prognosis:  good   Delice Lesch, MD  06/24/2015

## 2015-06-24 NOTE — Progress Notes (Signed)
PASAR#: [id number]   Neysa Hotter [phone]

## 2015-06-24 NOTE — Progress Notes (Signed)
Tristan Gong, RN Rehab Admission Coordinator Signed Physical Medicine and Rehabilitation PMR Pre-admission 06/24/2015 2:47 PM  Related encounter: ED to Hosp-Admission (Current) from 06/19/2015 in [place] Collapse All   PMR Admission Coordinator Pre-Admission Assessment  Patient: [name] is an 75 y.o., male MRN: [id number] DOB: 01-18-1941 Height: 5\' 8"  (172.7 cm) Weight: 81.647 kg (180 lb)  Insurance Information HMO: PPO: PCP: IPA: 80/20: yes OTHER: No HMO PRIMARY: Medicare a and b Policy#: [id number] Subscriber: pt Benefits: Phone #: onpline Name: 06/24/15 Eff. Date: 04/10/03 Deduct: $1316 Out of Pocket Max: none Life Max: none CIR: 100% SNF: 20 full days Outpatient: 80% Co-Pay: 20% Home Health: 100% Co-Pay: none DME: 80% Co-Pay: 20% Providers: pt choice  SECONDARY: [employer] choice care supplement Policy#: [id number] Subscriber: pt  Medicaid Application Date: Case Manager:  Disability Application Date: Case Worker:   Emergency [place]    Name Relation Home Work Mobile   Pio,[name] Spouse [phone]  [phone]     Current Medical History  Patient Admitting Diagnosis: left PICA and cerebellar cvas  History of Present Illness: [name] is a 75 y.o. right handed male with history of myasthenia gravis on prednisone and pyridostigmine, NIDDM, CAD with angioplasty, pulmonary emboli with chronic Coumadin. Per chart review patient lives with wife. Independent prior to admission and active. ps to entry of home. Patient with recent admission to [facility] 06/17/2015 for dehydration observed for 2 days discharged on 06/19/2015.  Presented 06/20/2015 with increased weakness, vertigo, nausea. INR 2.00, troponin negative. 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 Left vertebral artery occlusion. CT angiogram of head and neck showed proximal left V2 vertebral artery occlusion with intermittent distal reconstitution. Echocardiogram with ejection fraction of 123456 grade 1 diastolic dysfunction. Patient did not receive TPA. Neurology consulted presently on eliquis. Tolerating a regular diet.  NIH Total: 0    Past Medical History  Past Medical History  Diagnosis Date  . Coronary atherosclerosis of unspecified type of vessel, native or graft     a. cath in 2003 showed 10-20% LM, scattered 20% prox-mid LAD, 30% more distal LAD, 50-60% stenosis of LAD towards apex, 20% Cx, 30% dRCA, focal 95% stenosis in smaller of 2 branches of PDA, EF 60-65%.  . Mixed hyperlipidemia   . Glucose intolerance (impaired glucose tolerance)     history of  . Diverticulosis of colon (without mention of hemorrhage)   . Hx of colonic polyps   . Internal hemorrhoids without mention of complication   . Rash and other nonspecific skin eruption   . Conjunctivitis unspecified   . Myasthenia gravis ([place])     uses prednisone  . GERD (gastroesophageal reflux disease)   . Pulmonary embolism [facility]) June 2013    a. Bilateral PE 07/2011.  Marland Kitchen DVT (deep venous thrombosis) [facility]) June 2013  . Chronic anticoagulation   . Diabetes mellitus ([place])   . DJD (degenerative joint disease)   . Ptosis   . 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 Essential hypertension   . IBS (irritable bowel syndrome)   . History of pneumonia   . Dystonia   . 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 PONV (postoperative nausea and vomiting)   . External hemorrhoids without mention of complication   . History of  kidney stones   . Tubulovillous adenoma polyp of colon  2010  . HOH (hard of hearing)     hearing aids  . Osteoporosis 2016  . Gastroparesis   . Esophageal stricture   . Chronic edema   . Carotid artery disease ([place])     a. Duplex 05/2014: 123456 RICA, 123456 LICA, elevated velocities in right subclavian artery, normal left subclavian artery..    Family History  family history includes Breast cancer in his sister; Clotting disorder in his sister; Dementia in his sister; Heart attack in his father, mother, sister, sister, and sister; Heart disease in his father, mother, and sister; Stroke in his father and mother. There is no history of Colon cancer.  Prior Rehab/Hospitalizations:  Has the patient had major surgery during 100 days prior to admission? No  Current Medications   Current facility-administered medications:  . apixaban (ELIQUIS) tablet 5 mg, 5 mg, Oral, BID, Erenest Blank, RPH, 5 mg at 06/24/15 1042 . brimonidine (ALPHAGAN) 0.2 % ophthalmic solution 1 drop, 1 drop, Both Eyes, Q12H, 1 drop at 06/24/15 1043 **AND** timolol (TIMOPTIC) 0.5 % ophthalmic solution 1 drop, 1 drop, Both Eyes, Q12H, Edwin Dada, MD, 1 drop at 06/24/15 1043 . feeding supplement (ENSURE ENLIVE) (ENSURE ENLIVE) liquid 237 mL, 237 mL, Oral, BID BM, Bonnell Public, MD, 237 mL at 06/24/15 1339 . furosemide (LASIX) tablet 20 mg, 20 mg, Oral, Daily, Rosalin Hawking, MD, 20 mg at 06/24/15 1042 . imatinib (GLEEVEC) tablet 400 mg, 400 mg, Oral, Q lunch, Rosalin Hawking, MD, 400 mg at 06/23/15 1200 . isosorbide mononitrate (IMDUR) 24 hr tablet 30 mg, 30 mg, Oral, Daily, Rosalin Hawking, MD, 30 mg at 06/24/15 1042 . lisinopril (PRINIVIL,ZESTRIL) tablet 10 mg, 10 mg, Oral, Daily, Bonnell Public, MD, 10 mg at 06/24/15 1042 . loperamide (IMODIUM) capsule 2 mg, 2 mg, Oral, BH-q7a, Rosalin Hawking, MD, 2 mg at 06/24/15 0649 . LORazepam (ATIVAN) tablet 0.5 mg, 0.5 mg, Oral, BID  BM & HS PRN, Rosalin Hawking, MD, 0.5 mg at 06/23/15 2343 . metoprolol succinate (TOPROL-XL) 24 hr tablet 25 mg, 25 mg, Oral, Daily, Rosalin Hawking, MD, 25 mg at 06/24/15 1042 . multivitamin with minerals tablet 1 tablet, 1 tablet, Oral, Daily, Rosalin Hawking, MD, 1 tablet at 06/24/15 1042 . nitroGLYCERIN (NITROSTAT) SL tablet 0.4 mg, 0.4 mg, Sublingual, Q5 Min x 3 PRN, Rosalin Hawking, MD . pantoprazole (PROTONIX) EC tablet 40 mg, 40 mg, Oral, Daily, Rosalin Hawking, MD, 40 mg at 06/24/15 1042 . predniSONE (DELTASONE) tablet 5 mg, 5 mg, Oral, Daily, Rosalin Hawking, MD, 5 mg at 06/24/15 1042 . pyridostigmine (MESTINON) tablet 60 mg, 60 mg, Oral, TID, Rosalin Hawking, MD, 60 mg at 06/24/15 1043 . rosuvastatin (CRESTOR) tablet 5 mg, 5 mg, Oral, Daily, Rosalin Hawking, MD, 5 mg at 06/24/15 1042  Patients Current Diet: Diet Heart Room service appropriate?: Yes; Fluid consistency:: Thin  Precautions / Restrictions Precautions Precautions: Fall Precaution Comments: vertigo Restrictions Weight Bearing Restrictions: No   Has the patient had 2 or more falls or a fall with injury in the past year?No  Prior Activity Level Community (5-7x/wk): Independent and driving pta  Development worker, international aid / [place] Devices/Equipment: Eyeglasses Home Equipment: Shower seat - built in  Prior Device Use: Indicate devices/aids used by the patient prior to current illness, exacerbation or injury? None of the above  Prior Functional Level Prior Function Level of Independence: Independent (prior to this recent episode) Comments: very active, likes to garden  Self Care: Did the patient need help bathing, dressing, using the  toilet or eating? Independent  Indoor Mobility: Did the patient need assistance with walking from room to room (with or without device)? Independent  Stairs: Did the patient need assistance with internal or external stairs (with or without device)? Independent  Functional Cognition: Did the patient  need help planning regular tasks such as shopping or remembering to take medications? Independent  Current Functional Level Cognition  Overall Cognitive Status: Within Functional Limits for tasks assessed Orientation Level: Oriented X4 Safety/Judgement: Decreased awareness of safety, Decreased awareness of deficits General Comments: Pt continually getting up in room with wife despite knowing he is not steay on feet.    Extremity Assessment (includes Sensation/Coordination)  Upper Extremity Assessment: RUE deficits/detail, LUE deficits/detail RUE Deficits / Details: weakness in shoulder flexors RUE Coordination: decreased fine motor, decreased gross motor LUE Deficits / Details: weakness in shoulder flexors (left weaker than right); history of shoulder surgery; weak grip strength LUE Coordination: decreased fine motor, decreased gross motor  Lower Extremity Assessment: Defer to PT evaluation RLE Sensation: [facility]) RLE Coordination: decreased fine motor LLE Deficits / Details: Difficulty performing heel to shin LLE Sensation: (WFL) LLE Coordination: decreased fine motor    ADLs  Overall ADL's : Needs assistance/impaired Lower Body Dressing: Minimal assistance, Sit to/from stand Toilet Transfer: Minimal assistance, Ambulation, RW (sit to stand from bed) Functional mobility during ADLs: Minimal assistance, Rolling walker General ADL Comments: Encouraged pt to look at one thing to help with dizziness.     Mobility  Overal bed mobility: Needs Assistance Bed Mobility: Supine to Sit, Sit to Supine Supine to sit: Supervision Sit to supine: Supervision General bed mobility comments: Use of rail. + dizziness getting to EOB. no assist needed. Slow. performed x2.    Transfers  Overall transfer level: Needs assistance Equipment used: None Transfers: Sit to/from Stand Sit to Stand: Min assist General transfer comment: Min A to steady pt in standing. Sway noted due to  dizziness. Stood from [employer]. Cues for hand placement/technique and for slow descent into chair.    Ambulation / Gait / Stairs / Wheelchair Mobility  Ambulation/Gait Ambulation/Gait assistance: Mod assist Ambulation Distance (Feet): 100 Feet (x2 bouts) Assistive device: 1 person hand held assist Gait Pattern/deviations: Step-to pattern, Step-through pattern, Decreased stride length, Staggering right, Staggering left, Shuffle General Gait Details: Cues for fixating gaze during ambulation and esp during turns to decrease dizzy sensation. Balance deficits most notably in busy hallways- walking people, talking etc. Less jerkling like movements today. 1 seated rest brealk. reaching for rail at times for BUE support. Cues to use eyes, turn head and then body for mobility.  Gait velocity: decreased    Posture / Balance Dynamic Sitting Balance Sitting balance - Comments: Able to perform testing of AROM BLEs sitting EOB with posterior lean but no LOB. Balance Overall balance assessment: Needs assistance Sitting-balance support: Feet supported, No upper extremity supported Sitting balance-Leahy Scale: Good Sitting balance - Comments: Able to perform testing of AROM BLEs sitting EOB with posterior lean but no LOB. Standing balance support: During functional activity Standing balance-Leahy Scale: Fair Standing balance comment: Requires UE support in standing. Practiced looking out big window and focusing on things in environment with head movements.    Special needs/care consideration Bowel mgmt: continent Bladder mgmt:continent Diabetic mgmt Hgb A1c 5.7 Bed and chair alarm needed for pt decreased safety awareness     Previous Home Environment Living Arrangements: Spouse/significant other Lives With: Spouse Available Help at Discharge: Family, Available 24 hours/day Type of Home: House  Home Layout: Two level, Bed/bath upstairs Alternate Level Stairs-Rails: Right, Left Alternate  Level Stairs-Number of Steps: 9 steps; 1 flight to get to basement Home Access: Stairs to enter Entrance Stairs-Rails: Right Entrance Stairs-Number of Steps: 3 Bathroom Shower/Tub: Multimedia programmer: Standard Bathroom Accessibility: Yes How Accessible: Accessible via walker Home Care Services: No Additional Comments: pt has walk-in shower   Discharge Living Setting Plans for Discharge Living Setting: Patient's home, Lives with (comment) (spouse) Type of Home at Discharge: House Discharge Home Layout: Two level, Bed/bath upstairs Alternate Level Stairs-Rails: (9 steps, one flight to basement) Discharge Home Access: Stairs to enter Entrance Stairs-Rails: Right Entrance Stairs-Number of Steps: 3 Discharge Bathroom Shower/Tub: Walk-in shower Discharge Bathroom Toilet: Standard Discharge Bathroom Accessibility: Yes How Accessible: Accessible via walker Does the patient have any problems obtaining your medications?: No  Social/Family/Support Systems Patient Roles: Spouse, Parent Contact Information: Corliss Marcus, wife Anticipated Caregiver: wife Anticipated Caregiver's Contact Information: see above Ability/Limitations of Caregiver: wife retired 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 Discharge Plan Discussed with Primary Caregiver: Yes Is Caregiver In Agreement with Plan?: Yes Does Caregiver/Family have Issues with Lodging/Transportation while Pt is in Rehab?: No   Goals/Additional Needs Patient/Family Goal for Rehab: supervision with PT and OT Expected length of stay: ELOS 10- 14 days Special Service Needs: pt on oral chemo for CML since 2004 Pt/Family Agrees to Admission and willing to participate: Yes Program Orientation Provided & Reviewed with Pt/Caregiver Including Roles & Responsibilities: Yes   Decrease burden of Care through IP rehab admission: n/a  Possible need for SNF placement upon discharge:not anticipated  Patient Condition: This patient's condition  remains as documented in the consult dated 06/24/2015, in which the Rehabilitation Physician determined and documented that the patient's condition is appropriate for intensive rehabilitative care in an inpatient rehabilitation facility. Will admit to inpatient rehab today.  Preadmission Screen Completed By: Cleatrice Burke, 06/24/2015 3:22 PM ______________________________________________________________________  Discussed status with Dr. Posey Pronto on 06/24/15 at 1522 and received telephone approval for admission today.  Admission Coordinator: Cleatrice Burke, time [id number] Date 06/24/2015          Cosigned by: Ankit Lorie Phenix, MD at 06/24/2015 3:26 PM  Revision History     Date/Time User Provider Type Action   06/24/2015 3:26 PM Ankit Lorie Phenix, MD Physician Cosign   06/24/2015 3:22 PM Tristan Gong, RN Rehab Admission Coordinator Sign

## 2015-06-24 NOTE — Discharge Summary (Signed)
Physician Discharge Summary  Patient ID: [name] MRN: [id number] DOB/AGE: Apr 09, 1940 75 y.o.  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 male with a past medical history significant for myasthenia gravis on prednisone and pyridostigmine, diffuse CAD, history of Pulmonary Embolism on warfarin, CML on Gleevec, and NIDDM admitted with vertigo, difficulty with balance and inability to walk. MRI of the brain revealed acute to early subacute nonhemorrhagic left posterior inferior cerebellar artery territory infarct and moderate chronic small vessel ischemic disease. CT angio of the head and neck revealed acute/early subacute left cerebellar infarct, no hemorrhage, proximal left V2 vertebral artery occlusion with intermittent distal reconstitution, patent and dominant right vertebral artery with mild V4 stenosis, right greater than left intracranial and extracranial carotid artery atherosclerosis without significant stenosis. Patient's anticoagulation was changed to Eliquis when INR was less than 2. Physical and occupational therapy was consulted, and they continued to work with the patient during the hospital stay. In patient rehabilitation was recommended. Patient is medically stable and will be transferred to the rehabilitation facility for further care. Neurology team directed patient's CVA  management during the hospital stay.  Consults: Neurology, PT/OT.  Discharge Medication - Please see the Med. Rec.  Significant Diagnostic Studies:  Imaging studies  Treatments:   Discharge Exam: Blood pressure 127/61, pulse 58, temperature 97.7 F (36.5 C), temperature source Oral, resp. rate 20, height 5\' 8"  (1.727 m), weight 81.647 kg (180 lb), SpO2 98 %.    Disposition: 01-Home or Self Care  Discharge Instructions    Ambulatory referral to Neurology    Complete by:  As directed   Dr. Jannifer Franklin  requests follow up for this patient in 2 months.     Diet - low sodium heart healthy    Complete by:  As directed      Diet Carb Modified    Complete by:  As directed      Increase activity slowly    Complete by:  As directed             Medication List    STOP taking these medications        warfarin 5 MG tablet  Commonly known as:  COUMADIN      TAKE these medications        apixaban 5 MG Tabs tablet  Commonly known as:  ELIQUIS  Take 1 tablet (5 mg total) by mouth 2 (two) times daily.     COMBIGAN 0.2-0.5 % ophthalmic solution  Generic drug:  brimonidine-timolol  Place 1 drop into both eyes every 12 (twelve) hours.     esomeprazole 40 MG capsule  Commonly known as:  NEXIUM  Take 1 capsule (40 mg total) by mouth 2 (two) times daily.     feeding supplement (ENSURE ENLIVE) Liqd  Take 237 mLs by mouth 2 (two) times daily between meals.     furosemide 20 MG tablet  Commonly known as:  LASIX  Take 20 mg by mouth daily.     imatinib 400 MG tablet  Commonly known as:  GLEEVEC  Take 400 mg by mouth daily with lunch.     isosorbide mononitrate 30 MG 24 hr tablet  Commonly known as:  IMDUR  TAKE 1 TABLET EVERY DAY     lisinopril 10 MG tablet  Commonly known as:  PRINIVIL,ZESTRIL  Take 1 tablet (10 mg total) by mouth daily.     loperamide 2 MG capsule  Commonly known as:  IMODIUM  Take 2 mg by mouth every morning. For diarrhea     LORazepam 0.5 MG tablet   Commonly known as:  ATIVAN  Take 0.5 mg by mouth 3 times/day as needed-between meals & bedtime for sleep.     metoprolol succinate 25 MG 24 hr tablet  Commonly known as:  TOPROL-XL  TAKE 1 TABLET EVERY DAY     multivitamin tablet  Take 1 tablet by mouth daily.     nitroGLYCERIN 0.4 MG SL tablet  Commonly known as:  NITROSTAT  Place 1 tablet (0.4 mg total) under the tongue every 5 (five) minutes x 3 doses as needed for chest pain.     predniSONE 5 MG tablet  Commonly known as:  DELTASONE  Take 1 tablet (5 mg total) by mouth daily.     pyridostigmine 60 MG tablet  Commonly known as:  MESTINON  Take 1 tablet (60 mg total) by mouth 3 (three) times daily.     rosuvastatin 10 MG tablet  Commonly known as:  CRESTOR  TAKE 1/2 TABLETS BY MOUTH DAILY.           Follow-up Information    Follow up with Lenor Coffin, MD. Schedule an appointment as soon as possible for a visit in 2 months.   Specialty:  Neurology   Contact information:   [address] [name] [address] [phone]       Follow up with Renato Shin, MD In 1 week.   Specialty:  Endocrinology   Why:  Follow up with Neurology   Contact information:   [address] [place] [address] [phone]       Signed: Bonnell Public 06/24/2015, 3:24 PM

## 2015-06-24 NOTE — Progress Notes (Signed)
PROGRESS NOTE    BRILEY TOWERS  [id number] DOB: 21-Jun-1940 DOA: 06/19/2015 PCP: Renato Shin, MD   Outpatient Specialists:    Brief Narrative: 75 y.o. 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 3 days ago, the patient was getting ready for lunch with his wife, when he had abrupt onset of vertigo and imbalance. He also had diaphoresis, lightheadedness, weakness, and vomiting, and so his wife called 9-1-1, and went to the hospital in [place]. There he was diagnosed with dehydration, and observed for 2 days, discharged Wednesday. As soon as he got home, he had inability to walk, continued vertigo and nausea, and so his wife brought him to [facility]. 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 Acute cerebellar stroke: Medically stable for discharge. In patient rehab input is appreciated.  2. Myasthenia gravis: Stable. 3. Coronary disease and chronic diastolic CHF: Stable. 4. CML: Continue home [place] 5. Hx of PE: [place] as above. 6. NIDDM: [place].    DVT prophylaxis: Anticoagulated Code Status: Full Family Communication: Wife and daughter. Disposition Plan: May need rehab Consultants:   Neurology   Subjective: Nil new complaints. Still having vertigo.  Objective: Filed Vitals:   06/24/15 0117 06/24/15 0515 06/24/15 0953 06/24/15 1353  BP: 116/59 134/75 135/64 127/61  Pulse: 60 58 58 58  Temp: 97.6 F (36.4 C) 97.9 F (36.6 C)  97.8 F (36.6 C) 97.7 F (36.5 C)  TempSrc: Oral Oral Oral Oral  Resp: 20 20 20 20   Height:      Weight:      SpO2: 99% 98% 98% 98%    Intake/Output Summary (Last 24 hours) at 06/24/15 1358 Last data filed at 06/24/15 1100  Gross per 24 hour  Intake    880 ml  Output    205 ml  Net    675 ml   Filed Weights   06/20/15 1500 06/23/15 1800  Weight: 86.2 kg (190 lb 0.6 oz) 81.647 kg (180 lb)    Examination:  General exam: Appears calm and comfortable  Respiratory system: Clear to auscultation.  Cardiovascular system: S1 & S2. Gastrointestinal system: Abdomen is obese, soft and nontender. No organomegaly or masses felt. Normal bowel sounds heard. Central nervous system: Alert and oriented. Gait imbalance Extremities: No leg edema.  Data Reviewed: I have personally reviewed following labs and imaging studies  CBC:  Recent Labs Lab 06/19/15 1750  WBC 9.7  HGB 11.2*  HCT 34.7*  MCV 94.8  PLT [id number]   Basic Metabolic Panel:  Recent Labs Lab 06/19/15 1750  NA 139  K 3.6  CL 109  CO2 23  GLUCOSE 117*  BUN 6  CREATININE 1.14  CALCIUM 8.5*   GFR: Estimated Creatinine Clearance: 52.5 mL/min (by C-G formula based on Cr of 1.14). Liver Function Tests: No results for input(s): AST, ALT, ALKPHOS, BILITOT, PROT, ALBUMIN in the last 168 hours. No results for input(s): LIPASE, AMYLASE in the last 168 hours. No results for input(s): AMMONIA  in the last 168 hours. Coagulation Profile:  Recent Labs Lab 06/19/15 1750 06/21/15 0352  INR 2.03* 1.56*   Cardiac Enzymes: No results for input(s): CKTOTAL, CKMB, CKMBINDEX, TROPONINI in the last 168 hours. BNP (last 3 results)  Recent Labs  03/13/15 1534  PROBNP 58.0   HbA1C: No results for input(s): HGBA1C in the last 72 hours. CBG: No results for input(s): GLUCAP in the last 168 hours. Lipid Profile: No results for input(s): CHOL, HDL, LDLCALC, TRIG, CHOLHDL, LDLDIRECT in the last 72 hours. Thyroid Function  Tests: No results for input(s): TSH, T4TOTAL, FREET4, T3FREE, THYROIDAB in the last 72 hours. Anemia Panel: No results for input(s): VITAMINB12, FOLATE, FERRITIN, TIBC, IRON, RETICCTPCT in the last 72 hours. Urine analysis:    Component Value Date/Time   COLORURINE YELLOW 06/19/2015 2107   APPEARANCEUR CLEAR 06/19/2015 2107   LABSPEC 1.014 06/19/2015 2107   PHURINE 5.5 06/19/2015 2107   GLUCOSEU NEGATIVE 06/19/2015 2107   GLUCOSEU NEGATIVE 04/09/2014 0910   HGBUR NEGATIVE 06/19/2015 2107   BILIRUBINUR NEGATIVE 06/19/2015 2107   KETONESUR 15* 06/19/2015 2107   PROTEINUR NEGATIVE 06/19/2015 2107   UROBILINOGEN 1.0 04/09/2014 0910   NITRITE NEGATIVE 06/19/2015 2107   LEUKOCYTESUR NEGATIVE 06/19/2015 2107   Sepsis Labs: @LABRCNTIP (procalcitonin:4,lacticidven:4)  )No results found for this or any previous visit (from the past 240 hour(s)).       Radiology Studies: Ct Head Wo Contrast  06/23/2015  CLINICAL DATA:  Follow-up infarct. History of hypertension, hyperlipidemia, carotid artery disease, diabetes. EXAM: CT HEAD WITHOUT CONTRAST TECHNIQUE: Contiguous axial images were obtained from the base of the skull through the vertex without intravenous contrast. COMPARISON:  CT HEAD Jun 20, 2015 and MRI head Jun 20, 2015 FINDINGS: INTRACRANIAL CONTENTS: Wedge-like hypodensity LEFT and severe inferior cerebellum is similar in appearance. No intraparenchymal hemorrhage, mass effect, midline shift. Ventricles and sulci are overall normal for patient's age. Patchy supratentorial white matter hypodensities better characterized on prior MRI compatible with chronic small vessel ischemic disease. No abnormal extra-axial fluid collections. Basal cisterns are patent. Moderate calcific atherosclerosis of the carotid siphons and included vertebral arteries. ORBITS: The included ocular globes and orbital contents are non-suspicious. Status post bilateral ocular lens implants. SINUSES: Moderate RIGHT  maxillary sinusitis. Mastoid air cells are well aerated. SKULL/SOFT TISSUES: No skull fracture. No significant soft tissue swelling. 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 Involutional changes. Moderate chronic small vessel ischemic disease. Electronically Signed   By: Elon Alas M.D.   On: 06/23/2015 06:23        Scheduled Meds: Continuous Infusions:   LOS: 4 days    Time spent: 35 Mins    Bonnell Public, MD [facility] Pager [phone]  If 7PM-7AM, please contact night-coverage [email or web address] Password [email or web address] 06/24/2015, 1:58 PM

## 2015-06-25 ENCOUNTER — Inpatient Hospital Stay (HOSPITAL_COMMUNITY): Payer: Medicare Other | Admitting: Physical Therapy

## 2015-06-25 ENCOUNTER — Inpatient Hospital Stay (HOSPITAL_COMMUNITY): Payer: Medicare Other | Admitting: Occupational Therapy

## 2015-06-25 DIAGNOSIS — G464 Cerebellar stroke syndrome: Secondary | ICD-10-CM

## 2015-06-25 LAB — COMPREHENSIVE METABOLIC PANEL
ALT: 28 U/L (ref 17–63)
AST: 32 U/L (ref 15–41)
Albumin: 3.4 g/dL — ABNORMAL LOW (ref 3.5–5.0)
Alkaline Phosphatase: 47 U/L (ref 38–126)
Anion gap: 13 (ref 5–15)
BILIRUBIN TOTAL: 0.8 mg/dL (ref 0.3–1.2)
BUN: 10 mg/dL (ref 6–20)
CO2: 28 mmol/L (ref 22–32)
CREATININE: 1.44 mg/dL — AB (ref 0.61–1.24)
Calcium: 9.1 mg/dL (ref 8.9–10.3)
Chloride: 99 mmol/L — ABNORMAL LOW (ref 101–111)
GFR calc Af Amer: 53 mL/min — ABNORMAL LOW (ref >60)
GFR, EST NON AFRICAN AMERICAN: 45 mL/min — AB (ref >60)
Glucose, Bld: 94 mg/dL (ref 65–99)
Potassium: 3.7 mmol/L (ref 3.5–5.1)
Sodium: 140 mmol/L (ref 135–145)
TOTAL PROTEIN: 5.8 g/dL — AB (ref 6.5–8.1)

## 2015-06-25 LAB — CBC WITH DIFFERENTIAL/PLATELET
BASOS ABS: 0 10*3/uL (ref 0.0–0.1)
Basophils Relative: 1 %
Eosinophils Absolute: 0.3 10*3/uL (ref 0.0–0.7)
Eosinophils Relative: 4 %
HEMATOCRIT: 37.3 % — AB (ref 39.0–52.0)
HEMOGLOBIN: 12.3 g/dL — AB (ref 13.0–17.0)
LYMPHS PCT: 23 %
Lymphs Abs: 1.5 10*3/uL (ref 0.7–4.0)
MCH: 31.1 pg (ref 26.0–34.0)
MCHC: 33 g/dL (ref 30.0–36.0)
MCV: 94.4 fL (ref 78.0–100.0)
Monocytes Absolute: 0.9 10*3/uL (ref 0.1–1.0)
Monocytes Relative: 14 %
NEUTROS ABS: 3.9 10*3/uL (ref 1.7–7.7)
Neutrophils Relative %: 58 %
Platelets: 220 10*3/uL (ref 150–400)
RBC: 3.95 MIL/uL — AB (ref 4.22–5.81)
RDW: 14.3 % (ref 11.5–15.5)
WBC: 6.6 10*3/uL (ref 4.0–10.5)

## 2015-06-25 LAB — GLUCOSE, CAPILLARY
GLUCOSE-CAPILLARY: 125 mg/dL — AB (ref 65–99)
Glucose-Capillary: 93 mg/dL (ref 65–99)
Glucose-Capillary: 132 mg/dL — ABNORMAL HIGH (ref 65–99)
Glucose-Capillary: 164 mg/dL — ABNORMAL HIGH (ref 65–99)

## 2015-06-25 MED ORDER — IMATINIB MESYLATE 100 MG PO TABS: 200.0000 mg | ORAL_TABLET | Freq: Every day | ORAL | Status: DC

## 2015-06-25 MED ORDER — IMATINIB MESYLATE 100 MG PO TABS
200.0000 mg | ORAL_TABLET | Freq: Two times a day (BID) | ORAL | Status: DC
  Administered 2015-06-25 – 2015-06-26 (×3): 200 mg via ORAL

## 2015-06-25 NOTE — Progress Notes (Signed)
Physical Therapy Session Note  Patient Details  Name: [name] MRN: [id number] Date of Birth: 11/24/1940  Today's Date: 06/25/2015 PT Individual Time: [id number] PT Individual Time Calculation (min): 68 min   Short Term Goals: Week 1:  PT Short Term Goal 1 (Week 1): STG = LTG due to short ELOS.  Skilled Therapeutic Interventions/Progress Updates:  Patient in bed upon arrival, sat edge of bed with increased time. Patient recalled gaze fixation compensatory technique to decrease dizziness with functional mobility and required min cues to utilize strategy during session. Patient ambulated in room to bathroom with min guard and stood to urinate with supervision. Gait training without device in controlled environment x 160 ft with min guard/min A. Patient demonstrates high fall risk as noted by score of 30/56 on Berg Balance Scale. Performed FTSS = 16 sec. Patient educated on risk of falls and goals and purpose of PT to increase independence and safety with functional mboility as he is anxious to DC home, patient verbalized understanding. Patient with increased dizziness and nausea after balance testing, rated 3/10 but improved with seated rest. Remainder of session focused on community and outdoor ambulation 2 x at least 250 ft with focus on gaze fixation and cues for reciprocal arm swing, supervision-min A throughout hospital, on/off elevators, over thresholds, uneven sidewalks, up/down 8 outdoor steps using 1 rail and one seated rest break inside before returning to room. Patient also required prolonged standing rest break in shade. Patient left sitting on bed with wife and visitor present, needs in reach.    Five times Sit to Stand Test (FTSS) Method: Use a straight back chair with a solid seat that is 16-18" high. Ask participant to sit on the chair with arms folded across their chest.   Instructions: "Stand up and sit down as quickly as possible 5 times, keeping your arms folded across your  chest."   Measurement: Stop timing when the participant stands the 5th time.  TIME: ___16___ (in seconds)  Times > 13.6 seconds is associated with increased disability and morbidity (Guralnik, 2000) Times > 15 seconds is predictive of recurrent falls in healthy individuals aged 1 and older (Buatois, et al., 2008) Normal performance values in community dwelling individuals aged 60 and older (Bohannon, 2006): o 60-69 years: 11.4 seconds o 70-79 years: 12.6 seconds o 80-89 years: 14.8 seconds  MCID: ? 2.3 seconds for Vestibular Disorders Mariah Milling, 2006)  Therapy Documentation Precautions:  Precautions Precautions: Fall Precaution Comments: vertigo Restrictions Weight Bearing Restrictions: No Pain: Pain Assessment Pain Assessment: No/denies pain Balance: Balance Balance Assessed: Yes Standardized Balance Assessment Standardized Balance Assessment: Berg Balance Test Berg Balance Test Sit to Stand: Able to stand  independently using hands Standing Unsupported: Able to stand 2 minutes with supervision Sitting with Back Unsupported but Feet Supported on Floor or Stool: Able to sit safely and securely 2 minutes Stand to Sit: Sits safely with minimal use of hands Transfers: Able to transfer with verbal cueing and /or supervision Standing Unsupported with Eyes Closed: Able to stand 10 seconds with supervision Standing Ubsupported with Feet Together: Able to place feet together independently and stand for 1 minute with supervision From Standing, Reach Forward with Outstretched Arm: Reaches forward but needs supervision From Standing Position, Pick up Object from Floor: Unable to try/needs assist to keep balance From Standing Position, Turn to Look Behind Over each Shoulder: Needs supervision when turning Turn 360 Degrees: Needs close supervision or verbal cueing Standing Unsupported, Alternately Place Feet on Step/Stool: Able to complete  4 steps without aid or supervision Standing  Unsupported, One Foot in Front: Needs help to step but can hold 15 seconds Standing on One Leg: Able to lift leg independently and hold equal to or more than 3 seconds Total Score: 30/56  See Function Navigator for Current Functional Status.   Therapy/Group: Individual Therapy  Laretta Alstrom 06/25/2015, 4:24 PM

## 2015-06-25 NOTE — Evaluation (Signed)
Physical Therapy Assessment and Plan  Patient Details  Name: [name] MRN: [id number] Date of Birth: 04/01/1940  PT Diagnosis: Abnormality of gait, Difficulty walking, Dizziness and giddiness, Hemiplegia dominant, Low back pain and Muscle weakness Rehab Potential: Good ELOS: 7-10 days   Today's Date: 06/25/2015 PT Individual Time: 1101-1200 PT Individual Time Calculation (min): 59 min    Problem List:  Patient Active Problem List   Diagnosis Date Noted  . 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 Myasthenia gravis ([place])   . Diabetes mellitus type 2 in nonobese (HCC)   . Coronary artery disease involving native coronary artery of native heart without angina pectoris   . Acute blood loss anemia   . Ataxia, post-stroke   . Gait disturbance, post-stroke   . Proximal leg weakness   . Cervical spondylosis without myelopathy   . Achilles tendinitis of right lower extremity   . Chronic diastolic CHF (congestive heart failure) ([place])   . Gastroesophageal reflux disease without esophagitis   . Chronic kidney disease   . 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 Cerebellar stroke ([place])   . Ischemic stroke ([place])   . Cerebrovascular accident (CVA) due to thrombosis of left vertebral artery ([place])   . History of pulmonary embolus (PE)   . Chronic anticoagulation   . HLD (hyperlipidemia)   . Neck pain 03/19/2015  . Renal insufficiency 03/13/2015  . Pain in the chest   . 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 Chest pain syndrome 03/07/2015  . Chronic diastolic heart failure, NYHA class 1 ([place]) 03/07/2015  . Diabetic gastroparesis ([place]) 03/07/2015  . Screening for osteoporosis 07/19/2014  . Routine general medical examination at a health care facility 10/23/2013  . Bilateral carotid bruits 05/24/2013  . Abnormal CT scan, lung 05/24/2013  . Encounter for therapeutic drug monitoring 03/14/2013  .  Lymphadenopathy, inguinal 01/11/2013  . 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 Myasthenia gravis with exacerbation ([place]) 03/31/2012  . 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 GERD with stricture 09/23/2011  . Long term (current) use of anticoagulants 08/03/2011  . History of pulmonary embolism 07/30/2011  . History of DVT (deep vein thrombosis) 07/30/2011  . Subconjunctival hemorrhage 06/22/2011  . Bilateral conjunctivitis 04/09/2011  . Diabetes mellitus type 2, controlled ([place]) 12/18/2010  . Hearing loss 08/04/2010  . Ptosis of eyelid 12/30/2009  . Essential hypertension 04/24/2009  . HYPERLIPIDEMIA, MIXED 10/05/2008  . HEMORRHOIDS-INTERNAL 07/02/2008  . HEMORRHOIDS-EXTERNAL 07/02/2008  . DIVERTICULOSIS-COLON 07/02/2008  . PERSONAL HX COLONIC POLYPS 07/02/2008  . RASH-NONVESICULAR 02/07/2008  . PNEUMONIA 12/08/2007  . 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 CAD (coronary artery disease) 07/07/2007  . NEPHROLITHIASIS, HX OF 07/07/2007    Past Medical History:  Past Medical History  Diagnosis Date  . Coronary atherosclerosis of unspecified type of vessel, native or graft     a. cath in 2003 showed 10-20% LM, scattered 20% prox-mid LAD, 30% more distal LAD, 50-60% stenosis of LAD towards apex, 20% Cx, 30% dRCA, focal 95% stenosis in smaller of 2 branches of PDA, EF 60-65%.  . Mixed hyperlipidemia   . Glucose intolerance (impaired glucose tolerance)     history of  . Diverticulosis of colon (without mention of hemorrhage)   . Hx of colonic polyps   . Internal hemorrhoids without mention of complication   . Rash and other nonspecific skin eruption   . Conjunctivitis unspecified   .  Myasthenia gravis ([place])     uses prednisone  . GERD (gastroesophageal reflux disease)   . Pulmonary embolism [facility]) June 2013    a. Bilateral PE 07/2011.  Marland Kitchen DVT (deep venous thrombosis) [facility]) June 2013  . Chronic anticoagulation   . Diabetes mellitus ([place])   . DJD (degenerative  joint disease)   . Ptosis   . CML (chronic myeloid leukemia) ([place])   . Essential hypertension   . IBS (irritable bowel syndrome)   . History of pneumonia   . Dystonia   . 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 PONV (postoperative nausea and vomiting)   . External hemorrhoids without mention of complication   . History of kidney stones   . Tubulovillous adenoma polyp of colon     2010  . HOH (hard of hearing)     hearing aids  . Osteoporosis 2016  . Gastroparesis   . Esophageal stricture   . Chronic edema   . Carotid artery disease ([place])     a. Duplex 05/2014: 59-16% RICA, 3-84% LICA, elevated velocities in right subclavian artery, normal left subclavian artery..   Past Surgical History:  Past Surgical History  Procedure Laterality Date  . Angioplasty  2001  . Back surgery      x2, lumbar and cervical  . Tonsillectomy    . Cataract extraction Bilateral 12/12  . Shoulder surgery Left 05/07/2009  . Cardiac catheterization    . Lumbar laminectomy/decompression microdiscectomy Right 09/14/2012    Procedure: Right Lumbar three-four, four-five, Lumbar five-Sacral one decompressive laminectomy;  Surgeon: [name], MD;  Location: [place] [facility];  Service: Neurosurgery;  Laterality: Right;  . Esophagogastroduodenoscopy endoscopy  04/2015    Assessment & Plan Clinical Impression: Patient is a 75 y.o. year old male with recent admission to the hospital with history of Myasthenia Gravis--prednisone snd pyridostigmine, diffuse CAD, PE-on coumadin, CKD, CML-on Gleevec, T2DM, recent admission to [facility] for dehydration 5/8-5/10 and was discharged to home but developed inability to walk with continued nausea and vomiting. He presented to [facility] ED 5/10 for work up and evaluation. INR at admission 2 and MRI brain done revealing acute stroke involving inferior left cerebellum. CTA head and neck showed acute/early subacute left cerebellar infarct, proximal left V2 vertebral artery occlusion with  intermittent distal reconstitution and patent dominant R-VA. 2D echo with EF 55-60% with no regional wall abnormality, elevated filling pressures and grade 1 diastolic dysfunction. Neurology felt that stroke etiology could be cardioembolic due to MAT on exam v/s large vessel disease due to chronic L-VA occlusion with intermittent reconstitution. Follow up CT head 05/14 showed evolving acute left PICA infarct without propagation or hemorrhagic conversion. Coumadin changed to Eliquis and patient to follow up with Dr. Jannifer Franklin after discharge. Therapy ongoing and patient limited by dizziness due to gaze instability, nausea and BLE weakness with instability. CIR recommended by rehab team. Patient transferred to CIR on 06/24/2015.   Patient currently requires min with mobility secondary to muscle weakness, decreased cardiorespiratoy endurance, unbalanced muscle activation, and decreased standing balance and decreased balance strategies.  Prior to hospitalization, patient was independent  with mobility and lived with Spouse in a House home.  Home access is 3Stairs to enter.  Patient will benefit from skilled PT intervention to maximize safe functional mobility, minimize fall risk and decrease caregiver burden for planned discharge home with intermittent assist.  Anticipate patient will benefit from follow up OP at discharge.  PT - End of Session Activity Tolerance: Tolerates 30+  min activity with multiple rests Endurance Deficit: Yes Endurance Deficit Description: 2/2 fatigue PT Assessment Rehab Potential (ACUTE/IP ONLY): Good PT Patient demonstrates impairments in the following area(s): Balance;Endurance;Pain;Safety PT Transfers Functional Problem(s): Bed Mobility;Bed to Chair;Floor;Furniture;Car PT Locomotion Functional Problem(s): Ambulation;Stairs PT Plan PT Intensity: Minimum of 1-2 x/day ,45 to 90 minutes PT Frequency: 5 out of 7 days PT Duration Estimated Length of Stay: 7-10 days PT  Treatment/Interventions: Ambulation/gait training;Discharge planning;Functional mobility training;Psychosocial support;Therapeutic Activities;Visual/perceptual remediation/compensation;Wheelchair propulsion/positioning;Therapeutic Exercise;Neuromuscular re-education;Disease management/prevention;Balance/vestibular training;Cognitive remediation/compensation;DME/adaptive equipment instruction;Pain management;Splinting/orthotics;UE/LE Strength taining/ROM;Community reintegration;Functional electrical stimulation;Patient/family education;Stair training;UE/LE Coordination activities PT Transfers Anticipated Outcome(s): Mod I PT Locomotion Anticipated Outcome(s): Mod I  PT Recommendation Recommendations for Other Services: Vestibular eval Follow Up Recommendations: Outpatient PT Patient destination: Home Equipment Recommended:  (need for RW to be determined)  Skilled Therapeutic Intervention PT evaluation & manual testing completed as documented below. Pt currently at [facility] assist for stand pivot & sit<>stand transfers and Min A for ambulating 150 ft without AD with inconsistent step length BLE & decreased trunk & pelvic rotation. Pt limited by dizziness with ambulation and will benefit from vestibular evaluation. Pt able to negotiate 12 steps with B rails & Min A due to decreased eccentric control in BLE. PT educated pt on therapy schedule and routine. At end of session pt left in recliner with wife present & all needs within reach.   PT Evaluation Precautions/Restrictions Precautions Precautions: Fall Precaution Comments: vertigo Restrictions Weight Bearing Restrictions: No General Chart Reviewed: Yes Response to Previous Treatment: Patient reporting fatigue but able to participate. Family/Caregiver Present:  (wife, Hassan Rowan, present upon PT arrival but excused herself immediately after)  Vital Signs Therapy Vitals Pulse Rate: (!) 58 BP: 120/64 mmHg (after ambulating) Patient Position (if  appropriate): Sitting Oxygen Therapy SpO2: 98 % Pulse Oximetry Type: Intermittent Pain Pain Assessment Pain Assessment: 0-10 Pain Score: 2  Pain Location:  (achilles) Pain Orientation: Right Pain Intervention(s): Repositioned Home Living/Prior Functioning Home Living Available Help at Discharge: Family;Available 24 hours/day Type of Home: House Home Access: Stairs to enter [employer] of Steps: 3 Entrance Stairs-Rails: Right;Left (can only reach one at a time) Home Layout: Two level;Bed/bath upstairs Alternate Level Stairs-Number of Steps: 9 Alternate Level Stairs-Rails: Left Bathroom Shower/Tub: Multimedia programmer: Standard  Lives With: Spouse Prior Function Level of Independence: Independent with basic ADLs;Independent with homemaking with ambulation;Independent with gait;Independent with transfers  Able to Take Stairs?: Yes Driving: Yes Vocation: Retired Leisure: Hobbies-yes (Comment) Comments:  (gardening) Vision/Perception  Wears glasses for reading at baseline. Reports "fuzzy" vision associated with dizziness & hx of ocular myasthenia gravis.  Cognition Overall Cognitive Status: Within Functional Limits for tasks assessed Arousal/Alertness: Awake/alert Orientation Level: Oriented X4;Oriented to person;Oriented to place;Oriented to time;Oriented to situation Memory: Appears intact Awareness: Appears intact Problem Solving: Appears intact Safety/Judgment: Appears intact Sensation Sensation Light Touch: Appears Intact Proprioception: Appears Intact (BLE) Coordination Finger Nose Finger Test:  (15 seconds LUE, 15 seconds RUE) Heel Shin Test: coordination intact BLE (29 seconds with LLE, 22 seconds with RLE) Motor  Motor Motor - Skilled Clinical Observations: generalized muscle weakness  Mobility Bed Mobility Bed Mobility: Rolling Right;Rolling Left;Supine to Sit;Sit to Supine (all on mat table) Rolling Right: 5: Supervision Rolling Left:  5: Supervision Supine to Sit: 5: Supervision Sit to Supine: 5: Supervision Transfers Transfers: Yes Sit to Stand: 4: Min guard Stand Pivot Transfers: 4: Min guard Locomotion  Ambulation Ambulation: Yes Ambulation/Gait Assistance: 4: Min assist Ambulation Distance (Feet): 150 Feet Assistive device: None Gait  Gait: Yes Gait Pattern:  (inconsistent step length BLE, lateral sway, decreased trunk & pelvic rotation) Stairs / Additional Locomotion Stairs: Yes Stairs Assistance: 4: Min assist Stair Management Technique: Two rails Number of Stairs: 12 Height of Stairs: 6 (inches) Wheelchair Mobility Wheelchair Mobility: Yes Wheelchair Assistance: 5: Investment banker, operational Details: Verbal cues for Marketing executive: Both upper extremities Wheelchair Parts Management: Needs assistance Distance: 250 ft   Balance Dynamic Standing Balance Dynamic Standing - Level of Assistance: 3: Min assist (during functional activity) Extremity Assessment   RLE Assessment RLE Assessment: Exceptions to [facility] RLE AROM (degrees) Overall AROM Right Lower Extremity: Within functional limits for tasks assessed RLE Strength Right Hip Flexion: 3+/5 Right Knee Flexion: 4/5 Right Knee Extension: 4-/5 Right Ankle Dorsiflexion: 4-/5 LLE Assessment LLE Assessment: Exceptions to WFL LLE AROM (degrees) Overall AROM Left Lower Extremity: Within functional limits for tasks assessed LLE Strength Left Hip Flexion: 3+/5 Left Knee Flexion: 4-/5 Left Knee Extension: 4-/5 Left Ankle Dorsiflexion: 4-/5   See Function Navigator for Current Functional Status.   Refer to Care Plan for Long Term Goals  Recommendations for other services: Other: Vestibular evaluation  Discharge Criteria: Patient will be discharged from PT if patient refuses treatment 3 consecutive times without medical reason, if treatment goals not met, if there is a change in medical status, if patient makes no progress  towards goals or if patient is discharged from hospital.  The above assessment, treatment plan, treatment alternatives and goals were discussed and mutually agreed upon: by patient  Waunita Schooner 06/25/2015, 12:08 PM

## 2015-06-25 NOTE — Progress Notes (Signed)
Pt transferred to Rehab from [facility]N. Alert and orientated x 4. Orientated to Rehab expectations, diagnostic specific information provided. Pt looking forward to starting therapy in the morning.

## 2015-06-25 NOTE — Progress Notes (Signed)
Patient information reviewed and entered into eRehab system by Carlos Quackenbush, RN, CRRN, PPS Coordinator.  Information including medical coding and functional independence measure will be reviewed and updated through discharge.     Per nursing patient was given "Data Collection Information Summary for Patients in Inpatient Rehabilitation Facilities with attached "Privacy Act Statement-Health Care Records" upon admission.  

## 2015-06-25 NOTE — Plan of Care (Signed)
Problem: RH Balance Goal: LTG Patient will maintain dynamic standing balance (PT) LTG: Patient will maintain dynamic standing balance with assistance during mobility activities (PT) With LRAD  Problem: RH Bed Mobility Goal: LTG Patient will perform bed mobility with assist (PT) LTG: Patient will perform bed mobility with assistance, with/without cues (PT). Without hospital bed features  Problem: RH Bed to Chair Transfers Goal: LTG Patient will perform bed/chair transfers w/assist (PT) LTG: Patient will perform bed/chair transfers with assistance, with/without cues (PT). With LRAD  Problem: RH Car Transfers Goal: LTG Patient will perform car transfers with assist (PT) LTG: Patient will perform car transfers with assistance (PT). With LRAD  Problem: RH Furniture Transfers Goal: LTG Patient will perform furniture transfers w/assist (OT/PT LTG: Patient will perform furniture transfers with assistance (OT/PT). With LRAD  Problem: RH Floor Transfers Goal: LTG Patient will perform floor transfers w/assist (PT) LTG: Patient will perform floor transfers with assistance (PT). With LRAD  Problem: RH Ambulation Goal: LTG Patient will ambulate in controlled environment (PT) LTG: Patient will ambulate in a controlled environment, # of feet with assistance (PT). 150 ft with LRAD Goal: LTG Patient will ambulate in home environment (PT) LTG: Patient will ambulate in home environment, # of feet with assistance (PT). 22 ft with LRAD Goal: LTG Patient will ambulate in community environment (PT) LTG: Patient will ambulate in community environment, # of feet with assistance (PT). 150 ft with LRAD  Problem: RH Stairs Goal: LTG Patient will ambulate up and down stairs w/assist (PT) LTG: Patient will ambulate up and down # of stairs with assistance (PT) 3 steps with 1 rail + 9 steps with L ascending rail

## 2015-06-25 NOTE — Progress Notes (Signed)
Social Work  Social Work Assessment and Plan  Patient Details  Name: [name] MRN: [id number] Date of Birth: 12/11/1940  Today's Date: 06/25/2015  Problem List:  Patient Active Problem List   Diagnosis Date Noted  . 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 Myasthenia gravis ([place])   . Diabetes mellitus type 2 in nonobese (HCC)   . Coronary artery disease involving native coronary artery of native heart without angina pectoris   . Acute blood loss anemia   . Ataxia, post-stroke   . Gait disturbance, post-stroke   . Proximal leg weakness   . Cervical spondylosis without myelopathy   . Achilles tendinitis of right lower extremity   . Chronic diastolic CHF (congestive heart failure) ([place])   . Gastroesophageal reflux disease without esophagitis   . Chronic kidney disease   . 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 Cerebellar stroke ([place])   . Ischemic stroke ([place])   . Cerebrovascular accident (CVA) due to thrombosis of left vertebral artery ([place])   . History of pulmonary embolus (PE)   . Chronic anticoagulation   . HLD (hyperlipidemia)   . Neck pain 03/19/2015  . Renal insufficiency 03/13/2015  . Pain in the chest   . 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 Chest pain syndrome 03/07/2015  . Chronic diastolic heart failure, NYHA class 1 ([place]) 03/07/2015  . Diabetic gastroparesis ([place]) 03/07/2015  . Screening for osteoporosis 07/19/2014  . Routine general medical examination at a health care facility 10/23/2013  . Bilateral carotid bruits 05/24/2013  . Abnormal CT scan, lung 05/24/2013  . Encounter for therapeutic drug monitoring 03/14/2013  . Lymphadenopathy, inguinal 01/11/2013  . 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 Myasthenia gravis with exacerbation ([place]) 03/31/2012  . 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 GERD with stricture 09/23/2011  . Long term (current) use of  anticoagulants 08/03/2011  . History of pulmonary embolism 07/30/2011  . History of DVT (deep vein thrombosis) 07/30/2011  . Subconjunctival hemorrhage 06/22/2011  . Bilateral conjunctivitis 04/09/2011  . Diabetes mellitus type 2, controlled ([place]) 12/18/2010  . Hearing loss 08/04/2010  . Ptosis of eyelid 12/30/2009  . Essential hypertension 04/24/2009  . HYPERLIPIDEMIA, MIXED 10/05/2008  . HEMORRHOIDS-INTERNAL 07/02/2008  . HEMORRHOIDS-EXTERNAL 07/02/2008  . DIVERTICULOSIS-COLON 07/02/2008  . PERSONAL HX COLONIC POLYPS 07/02/2008  . RASH-NONVESICULAR 02/07/2008  . PNEUMONIA 12/08/2007  . 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 CAD (coronary artery disease) 07/07/2007  . NEPHROLITHIASIS, HX OF 07/07/2007   Past Medical History:  Past Medical History  Diagnosis Date  . Coronary atherosclerosis of unspecified type of vessel, native or graft     a. cath in 2003 showed 10-20% LM, scattered 20% prox-mid LAD, 30% more distal LAD, 50-60% stenosis of LAD towards apex, 20% Cx, 30% dRCA, focal 95% stenosis in smaller of 2 branches of PDA, EF 60-65%.  . Mixed hyperlipidemia   . Glucose intolerance (impaired glucose tolerance)     history of  . Diverticulosis of colon (without mention of hemorrhage)   . Hx of colonic polyps   . Internal hemorrhoids without mention of complication   . Rash and other nonspecific skin eruption   . Conjunctivitis unspecified   . Myasthenia gravis ([place])     uses prednisone  . GERD (gastroesophageal reflux disease)   . Pulmonary embolism [facility]) June 2013    a. Bilateral PE 07/2011.  Marland Kitchen DVT (deep venous thrombosis) ([place])  June 2013  . Chronic anticoagulation   . Diabetes mellitus ([place])   . DJD (degenerative joint disease)   . Ptosis   . CML (chronic myeloid leukemia) ([place])   . Essential hypertension   . IBS (irritable bowel syndrome)   . History of pneumonia   . Dystonia   . 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 PONV (postoperative  nausea and vomiting)   . External hemorrhoids without mention of complication   . History of kidney stones   . Tubulovillous adenoma polyp of colon     2010  . HOH (hard of hearing)     hearing aids  . Osteoporosis 2016  . Gastroparesis   . Esophageal stricture   . Chronic edema   . Carotid artery disease ([place])     a. Duplex 05/2014: 123456 RICA, 123456 LICA, elevated velocities in right subclavian artery, normal left subclavian artery..   Past Surgical History:  Past Surgical History  Procedure Laterality Date  . Angioplasty  2001  . Back surgery      x2, lumbar and cervical  . Tonsillectomy    . Cataract extraction Bilateral 12/12  . Shoulder surgery Left 05/07/2009  . Cardiac catheterization    . Lumbar laminectomy/decompression microdiscectomy Right 09/14/2012    Procedure: Right Lumbar three-four, four-five, Lumbar five-Sacral one decompressive laminectomy;  Surgeon: [name], MD;  Location: [facility];  Service: Neurosurgery;  Laterality: Right;  . Esophagogastroduodenoscopy endoscopy  04/2015   Social History:  reports that he has never smoked. He has never used smokeless tobacco. He reports that he does not drink alcohol or use illicit drugs.  Family / Support Systems Marital Status: Married Patient Roles: Spouse, Parent Spouse/Significant Other: Hassan Rowan [id number] Children: Two children both out of town Other Supports: Friends and church members Anticipated Caregiver: Wife Ability/Limitations of Caregiver: Wife is in good health can assist pt Caregiver Availability: 24/7 Family Dynamics: Close knit family both children live out of town but do visit and are involved with their parents. Pt was very active prior to admission and wants to get back to this activity level. Wife is in good health and willing to assist pt,plans to be here and observe in therapies.  Social History Preferred language: English Religion: Baptist Cultural Background: No  issues Education: [employer] Read: Yes Write: Yes Employment Status: Retired Freight forwarder Issues: No issues Guardian/Conservator: None-according to MD pt is capable of making his own decisions while here   Abuse/Neglect Physical Abuse: Denies Verbal Abuse: Denies Sexual Abuse: Denies Exploitation of patient/patient's resources: Denies Self-Neglect: Denies  Emotional Status Pt's affect, behavior adn adjustment status: Pt is motivated and feels good about his am therapies. He has always been active and wants to get back to this. He does not want to burden his wife either. He feels if his vertigo can be eased he will do better and get home sooner. Wife reports he will work hard here he always has. Recent Psychosocial Issues: other health issues-vertigo, hx back surgeries, etc Pyschiatric History: No history deferred depression screening due to coping appropriately and able to verbalize his concerns. Will monitor and intervene if team feels needed, otherwise provide support while here. Substance Abuse History: No issues  Patient / Family Perceptions, Expectations & Goals Pt/Family understanding of illness & functional limitations: Pt and wife can explain his stroke and deficits. Both feel the MD has answered their questions and concerns. They feel they are accessible and appreciate their input regarding  pt's diagnoses. Both ready to be on rehab and get therapies to recover from this stroke Premorbid pt/family roles/activities: Husband, Father, grandfather, retiree, gardener, home owner, church member, etc Anticipated changes in roles/activities/participation: resume Pt/family expectations/goals: Pt states: " I want to be able to take care of myself before I return home."  Wife states: " I will help but know how independent he is."  [employer]: Other (Comment) (in the past) Premorbid Home Care/DME Agencies: Other (Comment) (receiving OPPT for  tendonitis) Transportation available at discharge: wife Resource referrals recommended: Support group (specify)  Discharge Planning Living Arrangements: Spouse/significant other Support Systems: Spouse/significant other, Children, Friends/neighbors, Church/faith community Type of Residence: Private residence Insurance Resources: [employer], Multimedia programmer (specify) ([employer] Supplement) Financial Resources: Social Security Financial Screen Referred: No Living Expenses: Own Money Management: Spouse, Patient Does the patient have any problems obtaining your medications?: No Home Management: Both help each other Patient/Family Preliminary Plans: Return home with wife who can assist if needed. She plans to be here daily and observe in therapies until team is ready for her to help. Both can see his progress already since he first got here. Aware team conference tomorrow, will await team's evaluations. Social Work Anticipated Follow Up Needs: HH/OP, Support Group  Clinical Impression Very pleasant gentleman who is motivated to work hard in therapies and regain his independence when leaves here. His wifeis involved and supportive and willing to assist at discharge. Vestibular issues which will have eval tomorrow Are his main issue. Will await team's evaluations and work on discharge plans.  Elease Hashimoto 06/25/2015, 1:04 PM

## 2015-06-25 NOTE — Care Management Note (Signed)
[facility] Individual Statement of Services  Patient Name:  ZAHRAN BURFORD  Date:  06/25/2015  Welcome to the [facility].  Our goal is to provide you with an individualized program based on your diagnosis and situation, designed to meet your specific needs.  With this comprehensive rehabilitation program, you will be expected to participate in at least 3 hours of rehabilitation therapies Monday-Friday, with modified therapy programming on the weekends.  Your rehabilitation program will include the following services:  Physical Therapy (PT), Occupational Therapy (OT), 24 hour per day rehabilitation nursing, Case Management (Social Worker), Rehabilitation Medicine, Nutrition Services and Pharmacy Services  Weekly team conferences will be held on Wednesday to discuss your progress.  Your Social Worker will talk with you frequently to get your input and to update you on team discussions.  Team conferences with you and your family in attendance may also be held.  Expected length of stay: 7-10 days  Overall anticipated outcome: Supervision to mod/i level  Depending on your progress and recovery, your program may change. Your Social Worker will coordinate services and will keep you informed of any changes. Your Social Worker's name and contact numbers are listed  below.  The following services may also be recommended but are not provided by the [facility] will be made to provide these services after discharge if needed.  Arrangements include referral to agencies that provide these services.  Your insurance has been verified to be:  [place] Your primary doctor is:  Renato Shin  Pertinent information will be shared with your doctor and your insurance company.  Social Worker:  Ovidio Kin, [place] or ([phone]  Information discussed with and copy given to patient by: Elease Hashimoto, 06/25/2015, 11:34 AM

## 2015-06-25 NOTE — Evaluation (Signed)
Speech Language Pathology Assessment and Plan  Patient Details  Name: [name] MRN: [id number] Date of Birth: [birth year]  SLP Diagnosis: Speech and Language deficits  Rehab Potential: Good ELOS: 7-10 days     Today's Date: 06/25/2015 SLP Individual Time: 1000-1055 SLP Individual Time Calculation (min): 55 min   Problem List:  Patient Active Problem List   Diagnosis Date Noted  . 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 Myasthenia gravis ([place])   . Diabetes mellitus type 2 in nonobese (HCC)   . Coronary artery disease involving native coronary artery of native heart without angina pectoris   . Acute blood loss anemia   . Ataxia, post-stroke   . Gait disturbance, post-stroke   . Proximal leg weakness   . Cervical spondylosis without myelopathy   . Achilles tendinitis of right lower extremity   . Chronic diastolic CHF (congestive heart failure) ([place])   . Gastroesophageal reflux disease without esophagitis   . Chronic kidney disease   . 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 Cerebellar stroke ([place])   . Ischemic stroke ([place])   . Cerebrovascular accident (CVA) due to thrombosis of left vertebral artery ([place])   . History of pulmonary embolus (PE)   . Chronic anticoagulation   . HLD (hyperlipidemia)   . Neck pain 03/19/2015  . Renal insufficiency 03/13/2015  . Pain in the chest   . 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 Chest pain syndrome 03/07/2015  . Chronic diastolic heart failure, NYHA class 1 ([place]) 03/07/2015  . Diabetic gastroparesis ([place]) 03/07/2015  . Screening for osteoporosis 07/19/2014  . Routine general medical examination at a health care facility 10/23/2013  . Bilateral carotid bruits 05/24/2013  . Abnormal CT scan, lung 05/24/2013  . Encounter for therapeutic drug monitoring 03/14/2013  . Lymphadenopathy, inguinal 01/11/2013  . Spinal stenosis of lumbar region  06/01/2012  . Myasthenia gravis with exacerbation ([place]) 03/31/2012  . 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 GERD with stricture 09/23/2011  . Long term (current) use of anticoagulants 08/03/2011  . History of pulmonary embolism 07/30/2011  . History of DVT (deep vein thrombosis) 07/30/2011  . Subconjunctival hemorrhage 06/22/2011  . Bilateral conjunctivitis 04/09/2011  . Diabetes mellitus type 2, controlled ([place]) 12/18/2010  . Hearing loss 08/04/2010  . Ptosis of eyelid 12/30/2009  . Essential hypertension 04/24/2009  . HYPERLIPIDEMIA, MIXED 10/05/2008  . HEMORRHOIDS-INTERNAL 07/02/2008  . HEMORRHOIDS-EXTERNAL 07/02/2008  . DIVERTICULOSIS-COLON 07/02/2008  . PERSONAL HX COLONIC POLYPS 07/02/2008  . RASH-NONVESICULAR 02/07/2008  . PNEUMONIA 12/08/2007  . 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 CAD (coronary artery disease) 07/07/2007  . NEPHROLITHIASIS, HX OF 07/07/2007   Past Medical History:  Past Medical History  Diagnosis Date  . Coronary atherosclerosis of unspecified type of vessel, native or graft     a. cath in 2003 showed 10-20% LM, scattered 20% prox-mid LAD, 30% more distal LAD, 50-60% stenosis of LAD towards apex, 20% Cx, 30% dRCA, focal 95% stenosis in smaller of 2 branches of PDA, EF 60-65%.  . Mixed hyperlipidemia   . Glucose intolerance (impaired glucose tolerance)     history of  . Diverticulosis of colon (without mention of hemorrhage)   . Hx of colonic polyps   . Internal hemorrhoids without mention of complication   . Rash and other nonspecific skin eruption   . Conjunctivitis unspecified   . Myasthenia gravis ([place])     uses prednisone  .  GERD (gastroesophageal reflux disease)   . Pulmonary embolism [facility]) June 2013    a. Bilateral PE 07/2011.  Marland Kitchen DVT (deep venous thrombosis) [facility]) June 2013  . Chronic anticoagulation   . Diabetes mellitus ([place])   . DJD (degenerative joint disease)   . Ptosis   . 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  Essential hypertension   . IBS (irritable bowel syndrome)   . History of pneumonia   . Dystonia   . 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 PONV (postoperative nausea and vomiting)   . External hemorrhoids without mention of complication   . History of kidney stones   . Tubulovillous adenoma polyp of colon     2010  . HOH (hard of hearing)     hearing aids  . Osteoporosis 2016  . Gastroparesis   . Esophageal stricture   . Chronic edema   . Carotid artery disease ([place])     a. Duplex 05/2014: 40-10% RICA, 2-72% LICA, elevated velocities in right subclavian artery, normal left subclavian artery..   Past Surgical History:  Past Surgical History  Procedure Laterality Date  . Angioplasty  2001  . Back surgery      x2, lumbar and cervical  . Tonsillectomy    . Cataract extraction Bilateral 12/12  . Shoulder surgery Left 05/07/2009  . Cardiac catheterization    . Lumbar laminectomy/decompression microdiscectomy Right 09/14/2012    Procedure: Right Lumbar three-four, four-five, Lumbar five-Sacral one decompressive laminectomy;  Surgeon: [name], MD;  Location: [facility];  Service: Neurosurgery;  Laterality: Right;  . Esophagogastroduodenoscopy endoscopy  04/2015    Assessment / Plan / Recommendation Clinical Impression  [name] is a 75 year old male with recent admission to [facility] for dehydration 5/8-5/10 and was discharged to home but developed inability to walk with continued nausea and vomiting. He presented to [facility] ED 5/10 for work up and evaluation. MRI brain done revealing acute stroke involving inferior left cerebellum. Follow up CT head 05/14 showed evolving acute left PICA infarct without propagation or hemorrhagic conversion. Pt admitted to CIR on 06/24/2015.  SLP evaluation completed on 06/25/2015 with the following results:  Pt presents with grossly intact cognitive function and scored WFL on all subtests of [employer] standardized cognitive assessment.  Pt's oral  motor exam is unremarkable for focal areas of oral motor weakness; however, pt presents with irregular breakdown of production and intermittently gropes for words with concomittant initial syllable prolongations which is not baseline per pt and pt's wife. Question mild ataxic dysarthria versus acquired neurogenic fluency impairment.  Pt also demonstrates high level word finding impairment and required min assist verbal cues for word retrieval during conversations.  Pt reports being "embarrassed" of how his speech sounds.  As a result, pt would benefit from skilled ST while inpatient in order to maximize functional independence and reduce burden of care prior to discharge.  Do not anticipate ST needs at discharge.    Skilled Therapeutic Interventions          Cognitive-linguistic evaluation completed with results and recommendations reviewed with patient and family.     SLP Assessment  Patient will need skilled [facility] during CIR admission    Recommendations  Patient destination: Home Follow up Recommendations: None Equipment Recommended: None recommended by SLP    SLP Frequency 1 to 3 out of 7 days   SLP Duration  SLP Intensity  SLP Treatment/Interventions 7-10 days   Minumum of  1-2 x/day, 30 to 90 minutes  Cueing hierarchy;Patient/family education;Speech/Language facilitation    Pain Pain Assessment Pain Assessment: No/denies pain  Prior Functioning Cognitive/Linguistic Baseline: Within functional limits Type of Home: House  Lives With: Spouse Available Help at Discharge: Family;Available 24 hours/day Vocation: Retired  Function:  Eating Eating                 Cognition Comprehension Comprehension assist level: Follows complex conversation/direction with extra time/assistive device  Expression   Expression assist level: Expresses basic 90% of the time/requires cueing < 10% of the time.  Social Interaction Social Interaction assist level:  Interacts appropriately with others - No medications needed.  Problem Solving Problem solving assist level: Solves complex 90% of the time/cues < 10% of the time  Memory Memory assist level: More than reasonable amount of time   Short Term Goals: Week 1: SLP Short Term Goal 1 (Week 1): STG=LTG due to ELOS   Refer to Care Plan for Long Term Goals  Recommendations for other services: None  Discharge Criteria: Patient will be discharged from SLP if patient refuses treatment 3 consecutive times without medical reason, if treatment goals not met, if there is a change in medical status, if patient makes no progress towards goals or if patient is discharged from hospital.  The above assessment, treatment plan, treatment alternatives and goals were discussed and mutually agreed upon: by patient and by family  Emilio Math 06/25/2015, 3:29 PM

## 2015-06-25 NOTE — Progress Notes (Signed)
Per report patient has been taking his home supply of Gleevec. RN asked patient to bring in his supply in the AM in the original packaging and have it verified by pharmacy. RN educated patient on not taking any medications that are not on his med list here, and if he needs any medications from home, they have to be verified through pharmacy first. Kennieth Francois, RN

## 2015-06-25 NOTE — Evaluation (Signed)
Occupational Therapy Assessment and Plan  Patient Details  Name: [name] MRN: [id number] Date of Birth: 08-Apr-1940  OT Diagnosis: disturbance of vision and muscle weakness (generalized) Rehab Potential: Rehab Potential (ACUTE ONLY): Excellent ELOS: 7-10 days   Today's Date: 06/25/2015 OT Individual Time: 9323-5573 OT Individual Time Calculation (min): 85 min     Problem List:  Patient Active Problem List   Diagnosis Date Noted  . 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 Myasthenia gravis ([place])   . Diabetes mellitus type 2 in nonobese (HCC)   . Coronary artery disease involving native coronary artery of native heart without angina pectoris   . Acute blood loss anemia   . Ataxia, post-stroke   . Gait disturbance, post-stroke   . Proximal leg weakness   . Cervical spondylosis without myelopathy   . Achilles tendinitis of right lower extremity   . Chronic diastolic CHF (congestive heart failure) ([place])   . Gastroesophageal reflux disease without esophagitis   . Chronic kidney disease   . 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 Cerebellar stroke ([place])   . Ischemic stroke ([place])   . Cerebrovascular accident (CVA) due to thrombosis of left vertebral artery ([place])   . History of pulmonary embolus (PE)   . Chronic anticoagulation   . HLD (hyperlipidemia)   . Neck pain 03/19/2015  . Renal insufficiency 03/13/2015  . Pain in the chest   . 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 Chest pain syndrome 03/07/2015  . Chronic diastolic heart failure, NYHA class 1 ([place]) 03/07/2015  . Diabetic gastroparesis ([place]) 03/07/2015  . Screening for osteoporosis 07/19/2014  . Routine general medical examination at a health care facility 10/23/2013  . Bilateral carotid bruits 05/24/2013  . Abnormal CT scan, lung 05/24/2013  . Encounter for therapeutic drug monitoring 03/14/2013  . Lymphadenopathy, inguinal  01/11/2013  . 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 Myasthenia gravis with exacerbation ([place]) 03/31/2012  . 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 GERD with stricture 09/23/2011  . Long term (current) use of anticoagulants 08/03/2011  . History of pulmonary embolism 07/30/2011  . History of DVT (deep vein thrombosis) 07/30/2011  . Subconjunctival hemorrhage 06/22/2011  . Bilateral conjunctivitis 04/09/2011  . Diabetes mellitus type 2, controlled ([place]) 12/18/2010  . Hearing loss 08/04/2010  . Ptosis of eyelid 12/30/2009  . Essential hypertension 04/24/2009  . HYPERLIPIDEMIA, MIXED 10/05/2008  . HEMORRHOIDS-INTERNAL 07/02/2008  . HEMORRHOIDS-EXTERNAL 07/02/2008  . DIVERTICULOSIS-COLON 07/02/2008  . PERSONAL HX COLONIC POLYPS 07/02/2008  . RASH-NONVESICULAR 02/07/2008  . PNEUMONIA 12/08/2007  . 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 CAD (coronary artery disease) 07/07/2007  . NEPHROLITHIASIS, HX OF 07/07/2007    Past Medical History:  Past Medical History  Diagnosis Date  . Coronary atherosclerosis of unspecified type of vessel, native or graft     a. cath in 2003 showed 10-20% LM, scattered 20% prox-mid LAD, 30% more distal LAD, 50-60% stenosis of LAD towards apex, 20% Cx, 30% dRCA, focal 95% stenosis in smaller of 2 branches of PDA, EF 60-65%.  . Mixed hyperlipidemia   . Glucose intolerance (impaired glucose tolerance)     history of  . Diverticulosis of colon (without mention of hemorrhage)   . Hx of colonic polyps   . Internal hemorrhoids without mention of complication   . Rash and other nonspecific skin eruption   . Conjunctivitis unspecified   . Myasthenia gravis ([place])  uses prednisone  . GERD (gastroesophageal reflux disease)   . Pulmonary embolism [facility]) June 2013    a. Bilateral PE 07/2011.  Marland Kitchen DVT (deep venous thrombosis) [facility]) June 2013  . Chronic anticoagulation   . Diabetes mellitus ([place])   . DJD (degenerative joint disease)   . Ptosis    . CML (chronic myeloid leukemia) ([place])   . Essential hypertension   . IBS (irritable bowel syndrome)   . History of pneumonia   . Dystonia   . 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 PONV (postoperative nausea and vomiting)   . External hemorrhoids without mention of complication   . History of kidney stones   . Tubulovillous adenoma polyp of colon     2010  . HOH (hard of hearing)     hearing aids  . Osteoporosis 2016  . Gastroparesis   . Esophageal stricture   . Chronic edema   . Carotid artery disease ([place])     a. Duplex 05/2014: 83-66% RICA, 2-94% LICA, elevated velocities in right subclavian artery, normal left subclavian artery..   Past Surgical History:  Past Surgical History  Procedure Laterality Date  . Angioplasty  2001  . Back surgery      x2, lumbar and cervical  . Tonsillectomy    . Cataract extraction Bilateral 12/12  . Shoulder surgery Left 05/07/2009  . Cardiac catheterization    . Lumbar laminectomy/decompression microdiscectomy Right 09/14/2012    Procedure: Right Lumbar three-four, four-five, Lumbar five-Sacral one decompressive laminectomy;  Surgeon: [name], MD;  Location: [facility];  Service: Neurosurgery;  Laterality: Right;  . Esophagogastroduodenoscopy endoscopy  04/2015    Assessment & Plan Clinical Impression: [name] is a 75 year old male with history of Myasthenia Gravis--prednisone snd pyridostigmine, diffuse CAD, PE-on coumadin, CKD, CML-on Gleevec, T2DM, recent admission to [facility] for dehydration 5/8-5/10 and was discharged to home but developed inability to walk with continued nausea and vomiting. He presented to [facility] ED 5/10 for work up and evaluation.   INR at admission 2 and MRI brain done revealing acute stroke involving inferior left cerebellum. CTA head and neck showed acute/early subacute left cerebellar infarct, proximal left V2 vertebral artery occlusion with intermittent distal reconstitution and patent dominant R-VA.  2D  echo with EF 55-60% with no regional wall abnormality, elevated filling pressures and grade 1 diastolic dysfunction.  Neurology felt that stroke etiology could be cardioembolic due to MAT on exam v/s large vessel disease due to chronic L-VA occlusion with intermittent reconstitution.  Follow up CT head 05/14 showed evolving acute left PICA infarct without propagation or hemorrhagic conversion.  Coumadin changed to Eliquis and patient to follow up with Dr. Jannifer Franklin after discharge. Therapy ongoing and patient limited by dizziness due to gaze instability, nausea and BLE weakness with instability. CIR recommended by rehab team.     Patient transferred to CIR on 06/24/2015 .    Patient currently requires min with basic self-care skills secondary to muscle weakness, decreased cardiorespiratoy endurance and central origin.  Prior to hospitalization, patient was fully independent, driving, gardening. Patient will benefit from skilled intervention to increase independence with basic self-care skills prior to discharge home with care partner.  Anticipate patient will require intermittent supervision and follow up home health.  OT - End of Session Activity Tolerance: Tolerates 10 - 20 min activity with multiple rests OT Assessment Rehab Potential (ACUTE ONLY): Excellent OT Patient demonstrates impairments in the following area(s): Balance;Endurance;Motor;Vision OT Basic ADL's Functional  Problem(s): Bathing;Dressing;Toileting OT Transfers Functional Problem(s): Toilet;Tub/Shower OT Additional Impairment(s): None OT Plan OT Intensity: Minimum of 1-2 x/day, 45 to 90 minutes OT Frequency: 5 out of 7 days OT Duration/Estimated Length of Stay: 7-10 days OT Treatment/Interventions: Balance/vestibular training;Discharge planning;Functional mobility training;Self Care/advanced ADL retraining;Patient/family education;Therapeutic Activities;Therapeutic Exercise;Visual/perceptual remediation/compensation OT Self Feeding  Anticipated Outcome(s): I OT Basic Self-Care Anticipated Outcome(s): S OT Toileting Anticipated Outcome(s): mod I OT Bathroom Transfers Anticipated Outcome(s): S OT Recommendation Patient destination: Home Follow Up Recommendations: Home health OT Equipment Recommended: Tub/shower seat   Skilled Therapeutic Intervention Pt seen for initial evaluation and ADL retraining with a focus on visual stabilization to reduce vertigo and dynamic balance. Pt stated his headache and vertigo were both at a level 2-3/10.  Pt did very well this am with all activities as he his highly motivated to have a very short LOS. He was fairly independent, needing only touching A to stabilize his balance and min A to stand from the bed. Although, he did need frequent rest breaks due to vertigo and cues to move slowly. Pt completed oral care and shaving in standing at the sink. Pt resting in recliner at end of session with all needs met.  OT Evaluation Precautions/Restrictions  Precautions Precautions: Fall Precaution Comments: vertigo Restrictions Weight Bearing Restrictions: No    Vital Signs Therapy Vitals Pulse Rate: 64 BP: 128/70 mmHg Pain Pain Assessment Pain Assessment: 0-10 Pain Score: 2  Pain Location:  ([place]illes) Pain Orientation: Right Pain Intervention(s): Repositioned Home Living/Prior Functioning Home Living Available Help at Discharge: Family, Available 24 hours/day Type of Home: House Home Access: Stairs to enter Technical brewer of Steps: 3 Entrance Stairs-Rails: Right, Left (can only reach one at a time) Home Layout: Two level, Bed/bath upstairs Alternate Level Stairs-Number of Steps: 9 Alternate Level Stairs-Rails: Left Bathroom Shower/Tub: Multimedia programmer: Standard  Lives With: Spouse Prior Function Level of Independence: Independent with basic ADLs, Independent with homemaking with ambulation, Independent with gait, Independent with transfers  Able to  Take Stairs?: Yes Driving: Yes Vocation: Retired Leisure: Hobbies-yes (Comment) Comments:  (gardening) ADL ADL ADL Comments: refer to functional navigator Vision/Perception  Vision- History Baseline Vision/History: Wears glasses (pt reports ocular myasthenia gravis but takes medication for this) Wears Glasses: Reading only Patient Visual Report:  (reports "fuzzy" vision associated with dizziness with movement) Vision- Assessment Vision Assessment?: Yes Eye Alignment: Impaired (comment) (slow movement of R eye) Tracking/Visual Pursuits: Decreased smoothness of horizontal tracking;Decreased smoothness of vertical tracking Visual Fields: No apparent deficits Additional Comments: dizziness excerbated with VOR testing  Cognition Overall Cognitive Status: Within Functional Limits for tasks assessed Arousal/Alertness: Awake/alert Orientation Level: Person;Place;Situation Person: Oriented Place: Oriented Situation: Oriented Year: 2017 Month: May Day of Week: Correct Memory: Appears intact Immediate Memory Recall: Sock;Blue;Bed Memory Recall: Sock;Blue;Bed Memory Recall Sock: Without Cue Memory Recall Blue: Without Cue Memory Recall Bed: Without Cue Awareness: Appears intact Problem Solving: Appears intact Safety/Judgment: Appears intact Sensation Sensation Light Touch: Appears Intact Stereognosis: Appears Intact Hot/Cold: Appears Intact Proprioception: Appears Intact Coordination Gross Motor Movements are Fluid and Coordinated: No Fine Motor Movements are Fluid and Coordinated: Yes Coordination and Movement Description: slow speed and excursion Finger Nose Finger Test:  (15 seconds LUE, 15 seconds RUE) Heel Shin Test: coordination intact BLE (29 seconds with LLE, 22 seconds with RLE) Motor  Motor Motor - Skilled Clinical Observations: generalized muscle weakness Mobility    steadying A with transfers with RW Trunk/Postural Assessment  Cervical Assessment Cervical  Assessment: Within Functional Limits Thoracic  Assessment Thoracic Assessment: Within Functional Limits Lumbar Assessment Lumbar Assessment: Within Functional Limits Postural Control Postural Control: Within Functional Limits  Balance Dynamic Sitting Balance Dynamic Sitting - Level of Assistance: 5: Stand by assistance Static Standing Balance Static Standing - Level of Assistance: 5: Stand by assistance Dynamic Standing Balance Dynamic Standing - Level of Assistance: 4: Min assist Extremity/Trunk Assessment RUE Assessment RUE Assessment: Exceptions to [facility] RUE Strength RUE Overall Strength Comments: sh flex 4-/5, grasp 4/5 LUE Assessment LUE Assessment: Exceptions to [facility] LUE Strength LUE Overall Strength Comments: sh flex 3+/5 (old RTC surgery), grasp 4/5   See Function Navigator for Current Functional Status.   Refer to Care Plan for Long Term Goals  Recommendations for other services: Other: vestibular evaluation  Discharge Criteria: Patient will be discharged from OT if patient refuses treatment 3 consecutive times without medical reason, if treatment goals not met, if there is a change in medical status, if patient makes no progress towards goals or if patient is discharged from hospital.  The above assessment, treatment plan, treatment alternatives and goals were discussed and mutually agreed upon: by patient  [facility] 06/25/2015, 11:47 AM

## 2015-06-25 NOTE — Progress Notes (Signed)
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 He presented to [facility] ED 5/10 for work up and evaluation. INR at admission 2 and MRI brain done revealing acute stroke involving inferior left cerebellum. CTA head and neck showed acute/early subacute left cerebellar infarct, proximal left V2 vertebral artery occlusion with intermittent distal reconstitution and patent dominant R-VA. 2D echo with EF 55-60% with no regional wall abnormality, elevated filling pressures and grade 1 diastolic dysfunction. Neurology felt that stroke etiology could be cardioembolic due to MAT on exam v/s large vessel disease due to chronic L-VA occlusion with intermittent reconstitution. Follow up CT head 05/14 showed evolving acute left PICA infarct without propagation or hemorrhagic conversion. Coumadin changed to Eliquis and patient to follow up with Dr. Jannifer Franklin after discharge  Subjective/Complaints:   Objective: Vital Signs: Blood pressure 148/84, pulse 68, temperature 98.1 F (36.7 C), temperature source Oral, resp. rate 17, height 5\' 7"  (1.702 m), weight 81.4 kg (179 lb 7.3 oz), SpO2 96 %. No results found. Results for orders placed or performed during the hospital encounter of 06/24/15 (from the past 72 hour(s))  Glucose, capillary     Status: Abnormal   Collection Time: 06/24/15  9:36 PM  Result Value Ref Range   Glucose-Capillary 141 (H) 65 - 99 mg/dL  Glucose, capillary     Status: None   Collection Time: 06/25/15  6:40 AM  Result Value Ref Range   Glucose-Capillary 93 65 - 99 mg/dL  CBC WITH DIFFERENTIAL     Status: Abnormal   Collection Time: 06/25/15  7:23 AM  Result Value Ref Range   WBC 6.6 4.0 - 10.5 K/uL   RBC 3.95 (L) 4.22 - 5.81 MIL/uL   Hemoglobin 12.3 (L) 13.0 -  17.0 g/dL   HCT 37.3 (L) 39.0 - 52.0 %   MCV 94.4 78.0 - 100.0 fL   MCH 31.1 26.0 - 34.0 pg   MCHC 33.0 30.0 - 36.0 g/dL   RDW 14.3 11.5 - 15.5 %   Platelets 220 150 - 400 K/uL   Neutrophils Relative % 58 %   Neutro Abs 3.9 1.7 - 7.7 K/uL   Lymphocytes Relative 23 %   Lymphs Abs 1.5 0.7 - 4.0 K/uL   Monocytes Relative 14 %   Monocytes Absolute 0.9 0.1 - 1.0 K/uL   Eosinophils Relative 4 %   Eosinophils Absolute 0.3 0.0 - 0.7 K/uL   Basophils Relative 1 %   Basophils Absolute 0.0 0.0 - 0.1 K/uL     HEENT: normal Cardio: RRR Resp: CTA B/L GI: BS positive Extremity:  Pulses positive Skin:   Intact Neuro: Alert/Oriented and Cranial Nerve Abnormalities mild left CN 6 Musc/Skel:  Normal Gen NAD 5/5 in BUE and 4/5 BLE Ataxia Left Heel shin  Assessment/Plan: 1. Functional deficits secondary to Left lower limb ataxia, trunkal ataxia, L PICA infarct which require 3+ hours per day of interdisciplinary therapy in a comprehensive inpatient rehab setting. Physiatrist is providing close team supervision and 24 hour management of active medical problems listed below. Physiatrist and rehab team continue to assess barriers to discharge/monitor patient progress toward functional and medical goals. FIM:                   Function - Comprehension Comprehension: Auditory Comprehension assist level: Follows basic conversation/direction with  extra time/assistive device  Function - Expression Expression: Verbal Expression assist level: Expresses basic needs/ideas: With extra time/assistive device  Function - Social Interaction Social Interaction assist level: Interacts appropriately with others with medication or extra time (anti-anxiety, antidepressant).  Function - Problem Solving Problem solving assist level: Solves basic 90% of the time/requires cueing < 10% of the time  Function - Memory Memory assist level: Recognizes or recalls 90% of the time/requires cueing < 10% of the  time Patient normally able to recall (first 3 days only): Current season, Location of own room, Staff names and faces, That he or she is in a hospital  Medical Problem List and Plan: 1.  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  Left lower limb ataxia and trunkal ataxia 2.  H/o PE/Anticoagulation: Pharmaceutical: Other (comment)--coumadin changed to Eliquis.  3. Cervical spondylosis/Achilles tenditinits/Pain Management: Was getting outpatient PT to help with pain and right ankle pain. Tylenol prn 4. Mood: LCSW to monitor for evaluation and support.   5. Neuropsych: This patient is capable of making decisions on his own behalf. 6. Skin/Wound Care: Routine pressure relief measures 7. Fluids/Electrolytes/Nutrition: Monitor I/O. Check lytes in am. Encourage fluid intake to avoid dehydration.   8.  CAD with chronic diastolic CHF-compensated: Monitor for signs of overload. Check weight daily. Low salt diet. Symptoms managed on Imdur, Lasix, metoprolol and Crestor.   9. 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 T2DM: Hgb A1C-5.7--well controlled on diet. Will check BS BS ac/hs and use SSI for elevated BS--if stable can d/c cbg checks. .   12. GERD with nausea and postprandial fullness:  Symptoms managed at this time on continue Nexium BID.   13. CKD?: Creatinine at -1.5 earlier this year and metformin d/c.   14. HTN: Monitor with increased activity.  Allow for permissive HTN  15. ABLA: Cont to monitor Hb, Hgb12.3   LOS (Days) 1 A FACE TO FACE EVALUATION WAS PERFORMED  [name] E 06/25/2015, 8:15 AM

## 2015-06-26 ENCOUNTER — Ambulatory Visit: Payer: Medicare Other | Admitting: Endocrinology

## 2015-06-26 ENCOUNTER — Inpatient Hospital Stay (HOSPITAL_COMMUNITY): Payer: Medicare Other | Admitting: Occupational Therapy

## 2015-06-26 ENCOUNTER — Inpatient Hospital Stay (HOSPITAL_COMMUNITY): Payer: Medicare Other | Admitting: Speech Pathology

## 2015-06-26 ENCOUNTER — Inpatient Hospital Stay (HOSPITAL_COMMUNITY): Payer: Medicare Other | Admitting: Physical Therapy

## 2015-06-26 ENCOUNTER — Inpatient Hospital Stay (HOSPITAL_COMMUNITY): Payer: Medicare Other | Admitting: *Deleted

## 2015-06-26 ENCOUNTER — Inpatient Hospital Stay (HOSPITAL_COMMUNITY): Payer: Medicare Other

## 2015-06-26 LAB — GLUCOSE, CAPILLARY
GLUCOSE-CAPILLARY: 108 mg/dL — AB (ref 65–99)
GLUCOSE-CAPILLARY: 114 mg/dL — AB (ref 65–99)
GLUCOSE-CAPILLARY: 179 mg/dL — AB (ref 65–99)
Glucose-Capillary: 158 mg/dL — ABNORMAL HIGH (ref 65–99)

## 2015-06-26 MED ORDER — IMATINIB MESYLATE 100 MG PO TABS
200.0000 mg | ORAL_TABLET | Freq: Two times a day (BID) | ORAL | Status: DC
  Administered 2015-06-26: 200 mg via ORAL
  Filled 2015-06-26: qty 2

## 2015-06-26 NOTE — Progress Notes (Signed)
Speech Language Pathology Daily Session Note  Patient Details  Name: [name] MRN: [id number] Date of Birth: 1940/09/15  Today's Date: 06/26/2015 SLP Individual Time: 0800-0830 SLP Individual Time Calculation (min): 30 min  Short Term Goals: Week 1: SLP Short Term Goal 1 (Week 1): STG=LTG due to ELOS   Skilled Therapeutic Interventions:  Pt was seen for skilled ST targeting communication goals.  Pt much more alert today in comparison to yesterday's therapy session with no evidence of word finding difficulty or dysfluency during functional conversations with therapist, despite multitasking through basic ADLs.  Pt also was mod I for word finding during a structured verbal description task in a moderately distracting environment.  Given pt's change in function in comparison to yesterday's evaluation, suspect fatigue to play a role in function.  Pt was left sitting at edge of bed with call bell within reach.  Continue per current plan of care.    Function:  Eating Eating                 Cognition Comprehension Comprehension assist level: Follows complex conversation/direction with no assist  Expression   Expression assist level: Expresses complex ideas: With extra time/assistive device  Social Interaction Social Interaction assist level: Interacts appropriately with others - No medications needed.  Problem Solving Problem solving assist level: Solves complex problems: With extra time  Memory Memory assist level: Complete Independence: No helper    Pain Pain Assessment Pain Assessment: No/denies pain   Therapy/Group: Individual Therapy  Kasson Lamere, Selinda Orion 06/26/2015, 10:54 AM

## 2015-06-26 NOTE — Progress Notes (Signed)
Physical Therapy Vestibular Assessment and Session Note  Patient Details  Name: [name] MRN: [id number] Date of Birth: 16-Jan-1941  Today's Date: 06/26/2015 PT Individual Time: 1015-1115 PT Individual Time Calculation (min): 60 min   Short Term Goals: Week 1:  PT Short Term Goal 1 (Week 1): STG = LTG due to short ELOS.  Skilled Therapeutic Interventions/Progress Updates:   Pt received in bed; agreeable to vestibular assessment.  Performed supine > sit with supervision but required min A for sit > stand and ambulation with RW to dayroom due to LOB initially upon standing and initiation of gait.  See vestibular eval below.  Returned to room with pt experiencing one LOB with min A to recover when NT walked by him on R side.  In room pt ambulated to bathroom with RW with min A and min verbal cues for safe negotiation around obstacles and over threshold with RW and safe placement of RW in front of him, over toilet during urination in standing.  Pt performed all toileting tasks but required min A for balance.  Returned to bed to rest due to increase in nausea symptoms.  Provided pt with handout of VOR x 1 viewing exercises and reviewed exercise and safety recommendations with pt and family.  Pt and family verbalized understanding.  Pt left in bed with family present.  Therapy Documentation 1. History and Physical Examination    Pt presents with intermittent, spontaneous vertigo s/p cerebellar CVA.  Pt reports spinning sensation 0/10 at rest, 5/10 at worst.  Pt reports vomiting at onset of CVA but no vomiting since admission; does report intermittent nausea.  Pt denies changes in hearing or vision.    Glasses: Y for reading    H/O falls: None  Antivertiginous Medications:  Ativan when anxious and to help with sleeping  2. Vestibular Assessment                           Gross neck ROM WFL  Eye Alignment WFL  Oculomotor ROM WFL  Spontaneous  Nystagmus (room light and vision occluded) None   Gaze holding nystagmus(room light and vision occluded) Mild  Smooth pursuit WFL  Saccades WFL  Vergence Impaired  VOR Cancellation WFL  Pressure Tests (vision occluded) N/T  VOR slow Symptomatic for vertigo  Head Thrust Test + bilaterally  Head Shaking Test (vision occluded) N/T  Dynamic Visual Acuity         N/T  Rt. Hallpike Dix N/T  Lt. Hallpike Dix N/T  Rt. Roll Test N/T  Lt. Roll Test  N/T  MSQ Most symptomatic with: Head turns 2/10, Nose to L and R knee 1/10, supine > sit 1/10, 180 deg turn to R 1/10  Cover-Cross Cover (if indicated) [facility]  Head-Neck Differentiation Test (if indicated) N/T   Findings:  Patient signs and symptoms consistent with motion sensitivity, central vertigo, and impaired visual-vestibular interactions  3. Recommendations for Treatment: Compensation during gait if symptomatic to improve safety with gait with family at D/C; continue with therapy Habituation with incorporation of head turns with dynamic standing balance and gait, bending down to floor and slow re-introduction to visually stimulating environments; Adaptation: x 1 viewing in sitting and supported standing with target at 3 ft against blank background x 30 seconds horizontal head turns progressing to 2 minutes and then against busy background Medical illustrator).    Precautions:  Precautions Precautions: Fall Precaution Comments: vertigo Restrictions Weight Bearing Restrictions: No Vital Signs:  Therapy Vitals Temp: 98.4 F (36.9 C) Temp Source: Oral Pulse Rate: 64 Resp: 19 BP: 120/62 mmHg Patient Position (if appropriate): Lying Oxygen Therapy SpO2: 98 % O2 Device: Not Delivered Pain: Pain Assessment Pain Assessment: No/denies pain  See Function Navigator for Current Functional Status.   Therapy/Group: Individual Therapy  Raylene Everts [facility] 06/26/2015, 4:47 PM

## 2015-06-26 NOTE — Progress Notes (Signed)
Occupational Therapy Session Note  Patient Details  Name: ATLEY PAYANT MRN: [id number] Date of Birth: 1940/11/16  Today's Date: 06/26/2015 OT Individual Time: 1300-1401 OT Individual Time Calculation (min): 61 min    Skilled Therapeutic Interventions/Progress Updates:    Pt completed bathing, grooming, and dressing in session. He was able to ambulate around to gather the clothing and then walk to the shower with min guard assist.  He stood to shower with close supervision as well and even propped one LE up on the shower bench to dry it while standing unsupported.  LOB to the right with quick turn to the right resulting in min assist level to correct.  All grooming tasks at supervision level standing.  Educated pt on use of sockaide for donning socks as this gave him trouble at home and they plan to purchase one in the gift shop prior to discharge.  He was able to donn his shoes in sitting but then stood and propped his LE on a chair to tie them.  Discussed sitting to be safer and having a step stool to see if this made it easier.  Dizziness throughout ADL session rated at a 1/10.  Finished session with mobility while incorporating head turns both left and right.  Pt with one LOB to the right significantly to need mod assist to correct.  Decreased speed of walking and increase staggering noted during walking.  Returned to room at end of session with pt's spouse, son, and grandson all present.  Checked pt's spouse off to assist him with transfer to the bathroom.      Therapy Documentation Precautions:  Precautions Precautions: Fall Precaution Comments: vertigo Restrictions Weight Bearing Restrictions: No  Pain: Pain Assessment Pain Assessment: No/denies pain ADL: See Function Navigator for Current Functional Status.   Therapy/Group: Individual Therapy  Beau Vanduzer OTR/L 06/26/2015, 3:52 PM

## 2015-06-26 NOTE — Patient Care Conference (Signed)
Inpatient RehabilitationTeam Conference and Plan of Care Update Date: 06/26/2015   Time: 10:55 AM    Patient Name: [name]      Medical Record Number: [id number]  Date of Birth: [birth year] Sex: Male         Room/Bed: 4W22C/4W22C-01 Payor Info: Payor: MEDICARE / Plan: MEDICARE PART A AND B / Product Type: *No Product type* /    Admitting Diagnosis: CVA  Admit Date/Time:  06/24/2015  5:48 PM Admission Comments: No comment available   Primary Diagnosis:  <principal problem not specified> Principal Problem: <principal problem not specified>  Patient Active Problem List   Diagnosis Date Noted  . 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 Myasthenia gravis ([place])   . Diabetes mellitus type 2 in nonobese (HCC)   . Coronary artery disease involving native coronary artery of native heart without angina pectoris   . Acute blood loss anemia   . Ataxia, post-stroke   . Gait disturbance, post-stroke   . Proximal leg weakness   . Cervical spondylosis without myelopathy   . Achilles tendinitis of right lower extremity   . Chronic diastolic CHF (congestive heart failure) ([place])   . Gastroesophageal reflux disease without esophagitis   . Chronic kidney disease   . 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 Cerebellar stroke ([place])   . Ischemic stroke ([place])   . 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 History of pulmonary embolus (PE)   . Chronic anticoagulation   . HLD (hyperlipidemia)   . Neck pain 03/19/2015  . Renal insufficiency 03/13/2015  . Pain in the chest   . 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 Chest pain syndrome 03/07/2015  . Chronic diastolic heart failure, NYHA class 1 ([place]) 03/07/2015  . Diabetic gastroparesis ([place]) 03/07/2015  . Screening for osteoporosis 07/19/2014  . Routine general medical examination at a health care facility 10/23/2013  .  Bilateral carotid bruits 05/24/2013  . Abnormal CT scan, lung 05/24/2013  . Encounter for therapeutic drug monitoring 03/14/2013  . Lymphadenopathy, inguinal 01/11/2013  . 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 Myasthenia gravis with exacerbation ([place]) 03/31/2012  . 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 GERD with stricture 09/23/2011  . Long term (current) use of anticoagulants 08/03/2011  . History of pulmonary embolism 07/30/2011  . History of DVT (deep vein thrombosis) 07/30/2011  . Subconjunctival hemorrhage 06/22/2011  . Bilateral conjunctivitis 04/09/2011  . Diabetes mellitus type 2, controlled ([place]) 12/18/2010  . Hearing loss 08/04/2010  . Ptosis of eyelid 12/30/2009  . Essential hypertension 04/24/2009  . HYPERLIPIDEMIA, MIXED 10/05/2008  . HEMORRHOIDS-INTERNAL 07/02/2008  . HEMORRHOIDS-EXTERNAL 07/02/2008  . DIVERTICULOSIS-COLON 07/02/2008  . PERSONAL HX COLONIC POLYPS 07/02/2008  . RASH-NONVESICULAR 02/07/2008  . PNEUMONIA 12/08/2007  . 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 CAD (coronary artery disease) 07/07/2007  . NEPHROLITHIASIS, HX OF 07/07/2007    Expected Discharge Date: Expected Discharge Date: 06/29/15  Team Members Present: Physician leading conference: Dr. Alysia Penna Social Worker Present: Ovidio Kin, LCSW PT Present: Georjean Mode, Dustin Folks, PT OT Present: Clyda Greener, OT SLP Present: Windell Moulding, SLP PPS Coordinator present : Daiva Nakayama, RN, CRRN     Current Status/Progress Goal Weekly Team Focus  Medical   Occasional dizziness. Patient eager to go home  Home discharge with wife support  Discharge planning   Bowel/Bladder   cont x2 LBM: 06/25/15  remain cont   contine with plan of care   Swallow/Nutrition/ Hydration             ADL's   steadying A with balance with all self care  mod I toileting, S bathing in shower, shower transfers and LB dressing due to vertigo  vestibular exercises, dynamic  balance, ADL training with AE, pt/family education   Mobility   min assist overall; needs cueing for safe use of RW  modified independent overall including 9 steps with rail  neuro re-ed, vestibular challenges, balance, pt and family ed, high level gait   Communication   mild higher level word finding difficulty   mod I   education and carryover of compensatory strategies    Safety/Cognition/ Behavioral Observations            Pain   no complaints of pain   <2  assess and treat q shift and PRN    Skin   no issues   remain free from breakdown   routine pressure relief measures       *See Care Plan and progress notes for long and short-term goals.  Barriers to Discharge: Still has balance issues    Possible Resolutions to Barriers:  Continue rehabilitation and set up outpatient therapy    Discharge Planning/Teaching Needs:  Home with wife who can provide supervision level, has been here to observe in therapies      Team Discussion:  Goals supervision to mod/i level, will need some supervision for bathing and dressing. Vestibular eval today for his dizziness.Has no balance reactions. Wife here and feels he is doing well.   Revisions to Treatment Plan:  None   Continued Need for Acute Rehabilitation Level of Care: The patient requires daily medical management by a physician with specialized training in physical medicine and rehabilitation for the following conditions: Daily direction of a multidisciplinary physical rehabilitation program to ensure safe treatment while eliciting the highest outcome that is of practical value to the patient.: Yes Daily medical management of patient stability for increased activity during participation in an intensive rehabilitation regime.: Yes Daily analysis of laboratory values and/or radiology reports with any subsequent need for medication adjustment of medical intervention for : Neurological problems  [name], Gardiner Rhyme 06/26/2015, 12:48 PM

## 2015-06-26 NOTE — Progress Notes (Signed)
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 He presented to [facility] ED 5/10 for work up and evaluation. INR at admission 2 and MRI brain done revealing acute stroke involving inferior left cerebellum. CTA head and neck showed acute/early subacute left cerebellar infarct, proximal left V2 vertebral artery occlusion with intermittent distal reconstitution and patent dominant R-VA. 2D echo with EF 55-60% with no regional wall abnormality, elevated filling pressures and grade 1 diastolic dysfunction. Neurology felt that stroke etiology could be cardioembolic due to MAT on exam v/s large vessel disease due to chronic L-VA occlusion with intermittent reconstitution. Follow up CT head 05/14 showed evolving acute left PICA infarct without propagation or hemorrhagic conversion. Coumadin changed to Eliquis and patient to follow up with Dr. Jannifer Franklin after discharge  Subjective/Complaints: Still with dizziness as major c/o, but this is intermittent rather than constant ROS- neg CP, SOB, N/V/D Objective: Vital Signs: Blood pressure 135/64, pulse 68, temperature 98.1 F (36.7 C), temperature source Oral, resp. rate 18, height 5' 7"  (1.702 m), weight 80.332 kg (177 lb 1.6 oz), SpO2 96 %. No results found. Results for orders placed or performed during the hospital encounter of 06/24/15 (from the past 72 hour(s))  Glucose, capillary     Status: Abnormal   Collection Time: 06/24/15  9:36 PM  Result Value Ref Range   Glucose-Capillary 141 (H) 65 - 99 mg/dL  Glucose, capillary     Status: None   Collection Time: 06/25/15  6:40 AM  Result Value Ref Range   Glucose-Capillary 93 65 - 99 mg/dL  CBC WITH DIFFERENTIAL     Status: Abnormal   Collection Time: 06/25/15  7:23 AM  Result  Value Ref Range   WBC 6.6 4.0 - 10.5 K/uL   RBC 3.95 (L) 4.22 - 5.81 MIL/uL   Hemoglobin 12.3 (L) 13.0 - 17.0 g/dL   HCT 37.3 (L) 39.0 - 52.0 %   MCV 94.4 78.0 - 100.0 fL   MCH 31.1 26.0 - 34.0 pg   MCHC 33.0 30.0 - 36.0 g/dL   RDW 14.3 11.5 - 15.5 %   Platelets 220 150 - 400 K/uL   Neutrophils Relative % 58 %   Neutro Abs 3.9 1.7 - 7.7 K/uL   Lymphocytes Relative 23 %   Lymphs Abs 1.5 0.7 - 4.0 K/uL   Monocytes Relative 14 %   Monocytes Absolute 0.9 0.1 - 1.0 K/uL   Eosinophils Relative 4 %   Eosinophils Absolute 0.3 0.0 - 0.7 K/uL   Basophils Relative 1 %   Basophils Absolute 0.0 0.0 - 0.1 K/uL  Comprehensive metabolic panel     Status: Abnormal   Collection Time: 06/25/15  7:23 AM  Result Value Ref Range   Sodium 140 135 - 145 mmol/L   Potassium 3.7 3.5 - 5.1 mmol/L   Chloride 99 (L) 101 - 111 mmol/L   CO2 28 22 - 32 mmol/L   Glucose, Bld 94 65 - 99 mg/dL   BUN 10 6 - 20 mg/dL   Creatinine, Ser 1.44 (H) 0.61 - 1.24 mg/dL   Calcium 9.1 8.9 - 10.3 mg/dL   Total Protein 5.8 (L) 6.5 - 8.1 g/dL   Albumin 3.4 (L) 3.5 - 5.0 g/dL   AST 32 15 - 41 U/L   ALT 28 17 - 63 U/L  Alkaline Phosphatase 47 38 - 126 U/L   Total Bilirubin 0.8 0.3 - 1.2 mg/dL   GFR calc non Af Amer 45 (L) >60 mL/min   GFR calc Af Amer 53 (L) >60 mL/min    Comment: (NOTE) The eGFR has been calculated using the CKD EPI equation. This calculation has not been validated in all clinical situations. eGFR's persistently <60 mL/min signify possible Chronic Kidney Disease.    Anion gap 13 5 - 15  Glucose, capillary     Status: Abnormal   Collection Time: 06/25/15 12:01 PM  Result Value Ref Range   Glucose-Capillary 125 (H) 65 - 99 mg/dL  Glucose, capillary     Status: Abnormal   Collection Time: 06/25/15  4:23 PM  Result Value Ref Range   Glucose-Capillary 132 (H) 65 - 99 mg/dL  Glucose, capillary     Status: Abnormal   Collection Time: 06/25/15  9:41 PM  Result Value Ref Range   Glucose-Capillary 164  (H) 65 - 99 mg/dL  Glucose, capillary     Status: Abnormal   Collection Time: 06/26/15  6:51 AM  Result Value Ref Range   Glucose-Capillary 108 (H) 65 - 99 mg/dL     HEENT: normal Cardio: RRR Resp: CTA B/L GI: BS positive Extremity:  Pulses positive Skin:   Intact Neuro: Alert/Oriented and Cranial Nerve Abnormalities mild left CN 6 Musc/Skel:  Normal Gen NAD 5/5 in BUE and 4/5 BLE Ataxia Left Heel shin  Assessment/Plan: 1. Functional deficits secondary to Left lower limb ataxia, trunkal ataxia, L PICA infarct which require 3+ hours per day of interdisciplinary therapy in a comprehensive inpatient rehab setting. Physiatrist is providing close team supervision and 24 hour management of active medical problems listed below. Physiatrist and rehab team continue to assess barriers to discharge/monitor patient progress toward functional and medical goals. FIM: Function - Bathing Position: Shower Body parts bathed by patient: Right arm, Left arm, Chest, Abdomen, Front perineal area, Buttocks, Right upper leg, Left upper leg, Right lower leg, Left lower leg Body parts bathed by helper: Back Assist Level: Touching or steadying assistance(Pt > 75%)  Function- Upper Body Dressing/Undressing What is the patient wearing?: Pull over shirt/dress Pull over shirt/dress - Perfomed by patient: Thread/unthread right sleeve, Thread/unthread left sleeve, Put head through opening, Pull shirt over trunk Assist Level: Set up Function - Lower Body Dressing/Undressing What is the patient wearing?: Underwear, Pants, Socks, Shoes Position: Sitting EOB Underwear - Performed by patient: Thread/unthread right underwear leg, Thread/unthread left underwear leg, Pull underwear up/down Pants- Performed by patient: Thread/unthread right pants leg, Thread/unthread left pants leg, Pull pants up/down Socks - Performed by helper: Don/doff right sock, Don/doff left sock Shoes - Performed by helper: Don/doff right shoe,  Don/doff left shoe, Fasten right, Fasten left Assist for footwear: Dependant (therapist A pt due to dizziness and back issues) Assist for lower body dressing: Touching or steadying assistance (Pt > 75%)  Function - Toileting Toileting steps completed by patient: Adjust clothing prior to toileting, Performs perineal hygiene, Adjust clothing after toileting Toileting Assistive Devices: Grab bar or rail Assist level: Supervision or verbal cues  Function - Air cabin crew transfer assistive device: Landscape architect, Walker Assist level to toilet: Touching or steadying assistance (Pt > 75%) Assist level from toilet: Touching or steadying assistance (Pt > 75%)  Function - Chair/bed transfer Chair/bed transfer method: Ambulatory Chair/bed transfer assist level: Touching or steadying assistance (Pt > 75%) Chair/bed transfer assistive device: Armrests Chair/bed transfer details: Verbal cues for technique,  Verbal cues for sequencing  Function - Locomotion: Wheelchair Will patient use wheelchair at discharge?: No Type: Manual Max wheelchair distance: 250 ft Assist Level: Supervision or verbal cues Assist Level: Supervision or verbal cues Assist Level: Supervision or verbal cues Function - Locomotion: Ambulation Assistive device: No device Max distance: 250 ft Assist level: Touching or steadying assistance (Pt > 75%) Assist level: Touching or steadying assistance (Pt > 75%) Assist level: Touching or steadying assistance (Pt > 75%) Assist level: Touching or steadying assistance (Pt > 75%) Assist level: Touching or steadying assistance (Pt > 75%)  Function - Comprehension Comprehension: Auditory Comprehension assist level: Follows complex conversation/direction with extra time/assistive device  Function - Expression Expression: Verbal Expression assist level: Expresses basic 90% of the time/requires cueing < 10% of the time.  Function - Social Interaction Social Interaction assist  level: Interacts appropriately with others - No medications needed.  Function - Problem Solving Problem solving assist level: Solves complex 90% of the time/cues < 10% of the time  Function - Memory Memory assist level: More than reasonable amount of time Patient normally able to recall (first 3 days only): Current season, Location of own room, Staff names and faces, That he or she is in a hospital  Medical Problem List and Plan: 1.  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  Left lower limb ataxia and trunkal ataxia, team conference today 2.  H/o PE/Anticoagulation: Pharmaceutical: Other (comment)--coumadin changed to Eliquis.  3. Cervical spondylosis/Achilles tenditinits/Pain Management: Was getting outpatient PT to help with pain and right ankle pain. Tylenol prn 4. Mood: LCSW to monitor for evaluation and support.  Pt concerned about his wife with "bad knee" wants to d/c in am if possible 5. Neuropsych: This patient is capable of making decisions on his own behalf. 6. Skin/Wound Care: Routine pressure relief measures 7. Fluids/Electrolytes/Nutrition: Monitor I/O. Check lytes in am. Encourage fluid intake to avoid dehydration.   8.  CAD with chronic diastolic CHF-compensated: Monitor for signs of overload. Check weight daily. Low salt diet. Symptoms managed on Imdur, Lasix, metoprolol and Crestor.   9. 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 T2DM: Hgb A1C-5.7--well controlled on diet. Will check BS BS ac/hs and use SSI for elevated BS--if stable can d/c cbg checks. . CBG (last 3)   Recent Labs  06/25/15 1623 06/25/15 2141 06/26/15 0651  GLUCAP 132* 164* 108*        12. GERD with nausea and postprandial fullness:  Symptoms managed at this time on continue Nexium BID.   13. CKD?: Creatinine at -1.5 earlier this year and metformin d/c.   14. HTN: Monitor with  increased activity.  Allow for permissive HTN , BPs running on low side, reduce prinivil dose to 54m, on toprol XL 247mdaily  Filed Vitals:   06/25/15 1405 06/26/15 0502  BP: 113/58 135/64  Pulse: 56 68  Temp: 97.7 F (36.5 C) 98.1 F (36.7 C)  Resp: 18 18      LOS (Days) 2 A FACE TO FACE EVALUATION WAS PERFORMED  KIRSTEINS,ANDREW E 06/26/2015, 7:57 AM

## 2015-06-26 NOTE — Discharge Summary (Signed)
Physician Discharge Summary  Patient ID: DYER HUIZAR MRN: [id number] DOB/AGE: 1940-06-09 75 y.o.  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 BMP Latest Ref Rng 06/25/2015 06/19/2015 03/13/2015  Glucose 65 - 99 mg/dL 94 117(H) 166(H)  BUN 6 - 20 mg/dL 10 6 14   Creatinine 0.61 - 1.24 mg/dL 1.44(H) 1.14 1.53(H)  Sodium 135 - 145 mmol/L 140 139 139  Potassium 3.5 - 5.1 mmol/L 3.7 3.6 4.4  Chloride 101 - 111 mmol/L 99(L) 109 101  CO2 22 - 32 mmol/L 28 23 29   Calcium 8.9 - 10.3 mg/dL 9.1 8.5(L) 9.4     CBC: CBC Latest Ref Rng 06/25/2015 06/19/2015 03/07/2015  WBC 4.0 - 10.5 K/uL 6.6 9.7 10.6(H)  Hemoglobin 13.0 - 17.0 g/dL 12.3(L) 11.2(L) 13.9  Hematocrit 39.0 - 52.0 % 37.3(L) 34.7(L) 41.8  Platelets 150 - 400 K/uL 220 159 191     CBG:  Recent Labs Lab 06/28/15 0649 06/28/15 1133 06/28/15 1632 06/28/15 2032 06/29/15 0636  GLUCAP 92 134* 174* 159* 89    Today's Vitals   06/28/15 0929 06/28/15 1600 06/29/15 0509 06/29/15 0800  BP: 126/68 112/64 125/57   Pulse: 64 60 54   Temp:  98.4 F (36.9 C) 97.8 F (36.6 C)   TempSrc:  Oral Oral   Resp:  20 18   Height:      Weight:      SpO2:  98% 96%   PainSc:    0-No pain    Brief HPI:   CLEVIE DO is a 75 year old male with history of Myasthenia Gravis--prednisone snd pyridostigmine, diffuse CAD, PE-on coumadin, CKD, 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 He presented to [facility] ED 5/10 for evaluation. INR at admission 2 and MRI brain done revealing acute stroke involving inferior left cerebellum. CTA head and neck showed acute/early subacute left cerebellar infarct, proximal left V2 vertebral artery occlusion with intermittent distal reconstitution and patent dominant R-VA. 2 Neurology felt that stroke etiology could be cardioembolic due to MAT on exam v/s large vessel disease due to chronic L-VA occlusion with intermittent reconstitution and Coumadin was changed to Eliquis.  Patient with resultant dizziness due to gaze instability, nausea and BLE weakness with instability. CIR was recommended by rehab team.    Hospital Course: SAFEER GILLMAN was admitted to rehab 06/24/2015 for inpatient therapies to consist of PT, ST and OT at least three hours five days a week. Past admission physiatrist, therapy team and rehab RN have worked together to provide customized collaborative inpatient rehab.  Blood pressures were monitored on bid basis and were noted to be soft. Prinivil was decreased to 5 mg daily and have reasonably controlled on this. He was monitored for signs of overload with daily weights. Respiratory status has been stable and weight have been stable around 80 kg.  Po intake has been good and he is continent of bowel and bladder. Blood sugars  were monitored on ac/hs basis and he was placed on CM diet due to history of hyperglycemia due to steroids.   Follow up labs shows renal status is stable with creatinine at 1.44.  He was maintained on Eliquis and is tolerating this without SE. H/H is  12.3/37.3 on recheck.  He has made steady progress during his rehab stay and is modified independent in home environment and requires supervision in community setting. He will continue to receive follow up HHPT and [facility] after discharge.    Rehab course: During patient's stay in rehab weekly team  conferences were held to monitor patient's progress, set goals and discuss barriers to discharge. At admission, patient required  Min assist with mobility and basic self care tasks.  He exhibited mild ataxic dysarthria and word finding deficits.  He has had improvement in activity tolerance, balance, postural control, as well as ability to compensate for deficits.  He is able to complete ADL tasks with set up assist. Balance has improved with use of compensatory strategies to manage vestibular symptoms elicited with head turns. He is independent for transfers and is ambulation 500' with RW and modified independent level.  Verbal output has improved with resolution of word finding deficits and speech is 100% intelligible. Family education was done with wife regarding all aspects of care and mobility.     Disposition:  Home   Diet: Heart Healthy. Diabetic.   Special Instructions:     Medication List    STOP taking these medications        feeding supplement (ENSURE ENLIVE) Liqd      TAKE these medications        apixaban 5 MG Tabs tablet  Commonly known as:  ELIQUIS  Take 1 tablet (5 mg total) by mouth 2 (two) times daily.     COMBIGAN 0.2-0.5 % ophthalmic solution  Generic drug:  brimonidine-timolol  Place 1 drop into both eyes every 12 (twelve) hours.     docusate sodium 100 MG capsule  Commonly known as:  COLACE  Take 1 capsule (100 mg total) by mouth 2 (two) times daily.     esomeprazole 40 MG capsule  Commonly known as:  NEXIUM  Take 1 capsule (40 mg total) by mouth 2 (two) times daily.     furosemide 20 MG tablet  Commonly known as:  LASIX  Take 20 mg by mouth daily.     imatinib 100 MG tablet  Commonly known as:  GLEEVEC  Take 2 tablets (200 mg total) by mouth 2 (two) times daily with a meal. Take with meals and large glass of water.Caution:Chemotherapy.     isosorbide mononitrate 30 MG 24 hr tablet  Commonly known as:  IMDUR  TAKE 1 TABLET EVERY DAY      lidocaine 5 %  Commonly known as:  LIDODERM  Apply to left heel cord at 7 am and remove at 7 pm daily.     lisinopril 5 MG tablet  Commonly known as:  PRINIVIL,ZESTRIL  Take 1 tablet (5 mg total) by mouth daily.     loperamide 2 MG capsule  Commonly known as:  IMODIUM  Take 2 mg by mouth every morning. For diarrhea     LORazepam 0.5 MG tablet  Commonly known as:  ATIVAN  Take 1 tablet (0.5 mg total) by mouth 3 times/day as needed-between meals & bedtime for sleep.     metoprolol succinate 25 MG 24 hr tablet  Commonly  known as:  TOPROL-XL  TAKE 1 TABLET EVERY DAY     multivitamin tablet  Take 1 tablet by mouth daily.     nitroGLYCERIN 0.4 MG SL tablet  Commonly known as:  NITROSTAT  Place 1 tablet (0.4 mg total) under the tongue every 5 (five) minutes x 3 doses as needed for chest pain.     predniSONE 5 MG tablet  Commonly known as:  DELTASONE  Take 1 tablet (5 mg total) by mouth daily.     promethazine 12.5 MG tablet  Commonly known as:  PHENERGAN  Take 1 tablet (12.5 mg total) by mouth every 6 (six) hours as needed for nausea or vomiting.     pyridostigmine 60 MG tablet  Commonly known as:  MESTINON  Take 1 tablet (60 mg total) by mouth 3 (three) times daily.     rosuvastatin 10 MG tablet  Commonly known as:  CRESTOR  TAKE 1/2 TABLETS BY MOUTH DAILY.       Follow-up Information    Follow up with Charlett Blake, MD.   Specialty:  Physical Medicine and Rehabilitation   Why:  office will call you with follow up appointment   Contact information:   [name] [address] [phone]       Follow up with Lenor Coffin, MD. Call today.   Specialty:  Neurology   Why:  for follow up in 6 weeks.    Contact information:   [address] [place] [address] [phone]       Follow up with Renato Shin, MD On 07/11/2015.   Specialty:  Endocrinology   Why:  appt @ 9:15 AM   Contact information:   [address] [place] [address] [phone]       Signed: Bary Leriche 07/01/2015, 4:06 PM

## 2015-06-26 NOTE — Progress Notes (Signed)
Recreational Therapy Session Note  Patient Details  Name: [name] MRN: [id number] Date of Birth: [birth year] Today's Date: 06/26/2015  Pain: no c/o Met with pt to discuss TR services, use of leisure time post discharge, activity analysis with potential adaptations & community reintegration.  Pt stated understanding.  Pt is extremely motivated to regain further independence and anxious to discharge home.  No further TR as pt is scheduled for discharge home 5/20.  [place] 06/26/2015, 12:08 PM

## 2015-06-26 NOTE — Progress Notes (Addendum)
Physical Therapy Note  Patient Details  Name: TAVARI COVALT MRN: [id number] Date of Birth: 05-10-1940 Today's Date: 06/26/2015  0945-1000, 45 min individual tx  Gait in room to toilet to stand to urinate, using RW.  Gait to/from gym with RW; pt needs mod cueing to use RW safely during turns and sit><stand.  neuromuscular re-education via demo, VCS, manual cues for R hip ER stretch and bil IR strengthening in sitting with yoga block between feet, 2 x 10 with 2 second hold; seated row with 2# bar for decreasing forward shoulders; [place] A standing exs 1 x 10 each bil calf raises, toe raises, mini squats, R/L hip abduction with neutral hip rotation.  Therapetic activity squatting>< standing to place clothes pin on bar slightly forward of BOS; delayed balance strategies noted. Pt left resting in bed with bed alarm set.  Juriel Cid 06/26/2015, 7:55 AM

## 2015-06-26 NOTE — Progress Notes (Signed)
Social Work Elease Hashimoto, LCSW Social Worker Signed  Patient Care Conference 06/26/2015 12:48 PM    Expand All Collapse All   Inpatient RehabilitationTeam Conference and Plan of Care Update Date: 06/26/2015   Time: 10:55 AM     Patient Name: [name]       Medical Record Number: [id number]  Date of Birth: 07-15-1940 Sex: Male         Room/Bed: 4W22C/4W22C-01 Payor Info: Payor: MEDICARE / Plan: MEDICARE PART A AND B / Product Type: *No Product type* /    Admitting Diagnosis: CVA  Admit Date/Time:  06/24/2015  5:48 PM Admission Comments: No comment available   Primary Diagnosis:  <principal problem not specified> Principal Problem: <principal problem not specified>    Patient Active Problem List     Diagnosis  Date Noted   .  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  Myasthenia gravis ([place])     .  Diabetes mellitus type 2 in nonobese (HCC)     .  Coronary artery disease involving native coronary artery of native heart without angina pectoris     .  Acute blood loss anemia     .  Ataxia, post-stroke     .  Gait disturbance, post-stroke     .  Proximal leg weakness     .  Cervical spondylosis without myelopathy     .  Achilles tendinitis of right lower extremity     .  Chronic diastolic CHF (congestive heart failure) ([place])     .  Gastroesophageal reflux disease without esophagitis     .  Chronic kidney disease     .  Cerebral infarction involving left cerebellar artery ([place])  06/20/2015   .  Cerebellar stroke ([place])     .  Ischemic stroke ([place])     .  Cerebrovascular accident (CVA) due to thrombosis of left vertebral artery ([place])     .  History of pulmonary embolus (PE)     .  Chronic anticoagulation     .  HLD (hyperlipidemia)     .  Neck pain  03/19/2015   .  Renal insufficiency  03/13/2015   .  Pain in the chest     .  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[place])     .  Chest pain syndrome  03/07/2015   .  Chronic  diastolic heart failure, NYHA class 1 ([place])  03/07/2015   .  Diabetic gastroparesis ([place])  03/07/2015   .  Screening for osteoporosis  07/19/2014   .  Routine general medical examination at a health care facility  10/23/2013   .  Bilateral carotid bruits  05/24/2013   .  Abnormal CT scan, lung  05/24/2013   .  Encounter for therapeutic drug monitoring  03/14/2013   .  Lymphadenopathy, inguinal  01/11/2013   .  Spinal stenosis of lumbar region  06/01/2012   .  Myasthenia gravis with exacerbation ([place])  03/31/2012   .  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  GERD with stricture  09/23/2011   .  Long term (current) use of anticoagulants  08/03/2011   .  History of pulmonary embolism  07/30/2011   .  History of DVT (deep vein thrombosis)  07/30/2011   .  Subconjunctival hemorrhage  06/22/2011   .  Bilateral conjunctivitis  04/09/2011   .  Diabetes mellitus type 2, controlled ([place])  12/18/2010   .  Hearing loss  08/04/2010   .  Ptosis of eyelid  12/30/2009   .  Essential hypertension  04/24/2009   .  HYPERLIPIDEMIA, MIXED  10/05/2008   .  HEMORRHOIDS-INTERNAL  07/02/2008   .  HEMORRHOIDS-EXTERNAL  07/02/2008   .  DIVERTICULOSIS-COLON  07/02/2008   .  PERSONAL HX COLONIC POLYPS  07/02/2008   .  RASH-NONVESICULAR  02/07/2008   .  PNEUMONIA  12/08/2007   .  CML (chronic myelocytic leukemia) ([place])  07/07/2007   .  CAD (coronary artery disease)  07/07/2007   .  NEPHROLITHIASIS, HX OF  07/07/2007     Expected Discharge Date: Expected Discharge Date: 06/29/15  Team Members Present: Physician leading conference: Dr. Alysia Penna Social Worker Present: Ovidio Kin, LCSW PT Present: Georjean Mode, Dustin Folks, PT OT Present: Clyda Greener, OT SLP Present: Windell Moulding, SLP PPS Coordinator present : Daiva Nakayama, RN, CRRN        Current Status/Progress  Goal  Weekly Team Focus   Medical     Occasional dizziness. Patient eager to go home  Home discharge with wife support   Discharge planning   Bowel/Bladder     cont x2 LBM: 06/25/15  remain cont   contine with plan of care   Swallow/Nutrition/ Hydration               ADL's     steadying A with balance with all self care  mod I toileting, S bathing in shower, shower transfers and LB dressing due to vertigo  vestibular exercises, dynamic balance, ADL training with AE, pt/family education   Mobility     min assist overall; needs cueing for safe use of RW  modified independent overall including 9 steps with rail  neuro re-ed, vestibular challenges, balance, pt and family ed, high level gait   Communication     mild higher level word finding difficulty   mod I   education and carryover of compensatory strategies    Safety/Cognition/ Behavioral Observations              Pain     no complaints of pain   <2  assess and treat q shift and PRN    Skin     no issues   remain free from breakdown   routine pressure relief measures       *See Care Plan and progress notes for long and short-term goals.    Barriers to Discharge:  Still has balance issues     Possible Resolutions to Barriers:   Continue rehabilitation and set up outpatient therapy      Discharge Planning/Teaching Needs:   Home with wife who can provide supervision level, has been here to observe in therapies       Team Discussion:    Goals supervision to mod/i level, will need some supervision for bathing and dressing. Vestibular eval today for his dizziness.Has no balance reactions. Wife here and feels he is doing well.    Revisions to Treatment Plan:    None    Continued Need for Acute Rehabilitation Level of Care: The patient requires daily medical management by a physician with specialized training in physical medicine and rehabilitation for the following conditions: Daily direction of a multidisciplinary physical rehabilitation program to ensure safe treatment while eliciting the highest outcome that is of practical value to the patient.:  Yes Daily medical management of patient stability for increased activity during participation in an intensive rehabilitation  regime.: Yes Daily analysis of laboratory values and/or radiology reports with any subsequent need for medication adjustment of medical intervention for : Neurological problems  Elease Hashimoto 06/26/2015, 12:48 PM                  Patient ID: DEJA ITURRALDE, male   DOB: 09/28/1940, 75 y.o.   MRN: [id number]

## 2015-06-26 NOTE — Progress Notes (Signed)
Social Work Patient ID: SHIVAN HODES, male   DOB: January 21, 1941, 75 y.o.   MRN: [id number] Met with pt, wife and son in the room to discuss team conference goals mod/i-supervision level and discharge 5/20. He was hoping he would be ready for Sat. Family pleased with how well he is doing. Had vestibular evaluation today and knows what he needs to work on. Discussed equipment and home health needs. Will work toward discharge Sat.

## 2015-06-27 ENCOUNTER — Inpatient Hospital Stay (HOSPITAL_COMMUNITY): Payer: Medicare Other

## 2015-06-27 ENCOUNTER — Inpatient Hospital Stay (HOSPITAL_COMMUNITY): Payer: Medicare Other | Admitting: Occupational Therapy

## 2015-06-27 LAB — GLUCOSE, CAPILLARY
GLUCOSE-CAPILLARY: 127 mg/dL — AB (ref 65–99)
GLUCOSE-CAPILLARY: 146 mg/dL — AB (ref 65–99)
GLUCOSE-CAPILLARY: 116 mg/dL — AB (ref 65–99)
Glucose-Capillary: 106 mg/dL — ABNORMAL HIGH (ref 65–99)

## 2015-06-27 MED ORDER — LISINOPRIL 5 MG PO TABS
5.0000 mg | ORAL_TABLET | Freq: Every day | ORAL | Status: DC
  Administered 2015-06-28 – 2015-06-29 (×2): 5 mg via ORAL
  Filled 2015-06-27 (×2): qty 1

## 2015-06-27 MED ORDER — IMATINIB MESYLATE 400 MG PO TABS
200.0000 mg | ORAL_TABLET | Freq: Two times a day (BID) | ORAL | Status: DC
  Administered 2015-06-27 – 2015-06-28 (×4): 200 mg via ORAL
  Filled 2015-06-27 (×2): qty 1

## 2015-06-27 MED ORDER — IMATINIB MESYLATE 100 MG PO TABS: 200.0000 mg | ORAL_TABLET | Freq: Two times a day (BID) | ORAL | Status: DC

## 2015-06-27 NOTE — Progress Notes (Signed)
Initial Nutrition Assessment  DOCUMENTATION CODES:   Not applicable  INTERVENTION:  -Continue Ensure Enlive po BID, each supplement provides 350 kcal and 20 grams of protein -Continue Snacks -RD to continue to monitor  NUTRITION DIAGNOSIS:   Inadequate oral intake related to poor appetite, nausea, cancer and cancer related treatments as evidenced by per patient/family report.  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  -PO intake has improved. States he is "eating most of his tray." Also enjoying Ensure.  -Avg documented PO 78% with 100% at 5 out of 8 meals.  -Wt has remained stable since admission, has some mild non-pitting edema today but otherwise no fluid accumulations.  Nutrition-Focused physical exam completed. Findings are no fat depletion, no muscle depletion, and mild edema.   Will continue snacks.   Labs and Medications reviewed: CBG 106-158  Diet Order:  Diet heart healthy/carb modified Room service appropriate?: Yes; Fluid consistency:: Thin  Skin:  Reviewed, no issues  Last BM:  5/17  Height:   Ht Readings from Last 1 Encounters:  06/24/15 5\' 7"  (1.702 m)    Weight:   Wt Readings from Last 1 Encounters:  06/27/15 177 lb 8 oz (80.513 kg)    Ideal Body Weight:  70 kg  BMI:  Body mass index is 27.79 kg/(m^2).  Estimated Nutritional Needs:   Kcal:  1900-2100  Protein:  95-105 grams  Fluid:  >/= 1.9 - 2.1  L  EDUCATION NEEDS:   No education needs identified at this time  Satira Anis. Indya Oliveria, MS, RD LDN Inpatient Clinical Dietitian Pager [phone]

## 2015-06-27 NOTE — Progress Notes (Signed)
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 He presented to [facility] ED 5/10 for work up and evaluation. INR at admission 2 and MRI brain done revealing acute stroke involving inferior left cerebellum. CTA head and neck showed acute/early subacute left cerebellar infarct, proximal left V2 vertebral artery occlusion with intermittent distal reconstitution and patent dominant R-VA. 2D echo with EF 55-60% with no regional wall abnormality, elevated filling pressures and grade 1 diastolic dysfunction. Neurology felt that stroke etiology could be cardioembolic due to MAT on exam v/s large vessel disease due to chronic L-VA occlusion with intermittent reconstitution. Follow up CT head 05/14 showed evolving acute left PICA infarct without propagation or hemorrhagic conversion. Coumadin changed to Eliquis and patient to follow up with Dr. Jannifer Franklin after discharge  Subjective/Complaints: Discussed several issues with pt, including post d/c sexual activity and driving ROS- neg CP, SOB, N/V/D Objective: Vital Signs: Blood pressure 138/74, pulse 56, temperature 97.7 F (36.5 C), temperature source Oral, resp. rate 18, height _0  (1.702 m), weight 80.513 kg (177 lb 8 oz), SpO2 97 %. No results found. Results for orders placed or performed during the hospital encounter of 06/24/15 (from the past 72 hour(s))  Glucose, capillary     Status: Abnormal   Collection Time: 06/24/15  9:36 PM  Result Value Ref Range   Glucose-Capillary 141 (H) 65 - 99 mg/dL  Glucose, capillary     Status: None   Collection Time: 06/25/15  6:40 AM  Result Value Ref Range   Glucose-Capillary 93 65 - 99 mg/dL  CBC WITH DIFFERENTIAL     Status: Abnormal   Collection Time: 06/25/15  7:23 AM  Result Value  Ref Range   WBC 6.6 4.0 - 10.5 K/uL   RBC 3.95 (L) 4.22 - 5.81 MIL/uL   Hemoglobin 12.3 (L) 13.0 - 17.0 g/dL   HCT 37.3 (L) 39.0 - 52.0 %   MCV 94.4 78.0 - 100.0 fL   MCH 31.1 26.0 - 34.0 pg   MCHC 33.0 30.0 - 36.0 g/dL   RDW 14.3 11.5 - 15.5 %   Platelets 220 150 - 400 K/uL   Neutrophils Relative % 58 %   Neutro Abs 3.9 1.7 - 7.7 K/uL   Lymphocytes Relative 23 %   Lymphs Abs 1.5 0.7 - 4.0 K/uL   Monocytes Relative 14 %   Monocytes Absolute 0.9 0.1 - 1.0 K/uL   Eosinophils Relative 4 %   Eosinophils Absolute 0.3 0.0 - 0.7 K/uL   Basophils Relative 1 %   Basophils Absolute 0.0 0.0 - 0.1 K/uL  Comprehensive metabolic panel     Status: Abnormal   Collection Time: 06/25/15  7:23 AM  Result Value Ref Range   Sodium 140 135 - 145 mmol/L   Potassium 3.7 3.5 - 5.1 mmol/L   Chloride 99 (L) 101 - 111 mmol/L   CO2 28 22 - 32 mmol/L   Glucose, Bld 94 65 - 99 mg/dL   BUN 10 6 - 20 mg/dL   Creatinine, Ser 1.44 (H) 0.61 - 1.24 mg/dL   Calcium 9.1 8.9 - 10.3 mg/dL   Total Protein 5.8 (L) 6.5 - 8.1 g/dL   Albumin 3.4 (L) 3.5 - 5.0 g/dL   AST 32 15 - 41 U/L   ALT 28 17 - 63 U/L  Alkaline Phosphatase 47 38 - 126 U/L   Total Bilirubin 0.8 0.3 - 1.2 mg/dL   GFR calc non Af Amer 45 (L) >60 mL/min   GFR calc Af Amer 53 (L) >60 mL/min    Comment: (NOTE) The eGFR has been calculated using the CKD EPI equation. This calculation has not been validated in all clinical situations. eGFR's persistently <60 mL/min signify possible Chronic Kidney Disease.    Anion gap 13 5 - 15  Glucose, capillary     Status: Abnormal   Collection Time: 06/25/15 12:01 PM  Result Value Ref Range   Glucose-Capillary 125 (H) 65 - 99 mg/dL  Glucose, capillary     Status: Abnormal   Collection Time: 06/25/15  4:23 PM  Result Value Ref Range   Glucose-Capillary 132 (H) 65 - 99 mg/dL  Glucose, capillary     Status: Abnormal   Collection Time: 06/25/15  9:41 PM  Result Value Ref Range   Glucose-Capillary 164 (H) 65  - 99 mg/dL  Glucose, capillary     Status: Abnormal   Collection Time: 06/26/15  6:51 AM  Result Value Ref Range   Glucose-Capillary 108 (H) 65 - 99 mg/dL  Glucose, capillary     Status: Abnormal   Collection Time: 06/26/15 11:53 AM  Result Value Ref Range   Glucose-Capillary 114 (H) 65 - 99 mg/dL   Comment 1 Notify RN   Glucose, capillary     Status: Abnormal   Collection Time: 06/26/15  4:59 PM  Result Value Ref Range   Glucose-Capillary 179 (H) 65 - 99 mg/dL   Comment 1 Notify RN   Glucose, capillary     Status: Abnormal   Collection Time: 06/26/15  9:23 PM  Result Value Ref Range   Glucose-Capillary 158 (H) 65 - 99 mg/dL  Glucose, capillary     Status: Abnormal   Collection Time: 06/27/15  7:13 AM  Result Value Ref Range   Glucose-Capillary 106 (H) 65 - 99 mg/dL     HEENT: normal Cardio: RRR Resp: CTA B/L GI: BS positive Extremity:  Pulses positive Skin:   Intact Neuro: Alert/Oriented  Musc/Skel:  Normal Gen NAD 5/5 in BUE and 4/5 BLE Ataxia Left Heel shin  Assessment/Plan: 1. Functional deficits secondary to Left lower limb ataxia, trunkal ataxia, L PICA infarct which require 3+ hours per day of interdisciplinary therapy in a comprehensive inpatient rehab setting. Physiatrist is providing close team supervision and 24 hour management of active medical problems listed below. Physiatrist and rehab team continue to assess barriers to discharge/monitor patient progress toward functional and medical goals. FIM: Function - Bathing Position: Shower Body parts bathed by patient: Right arm, Left arm, Chest, Abdomen, Front perineal area, Buttocks, Right upper leg, Left upper leg, Right lower leg, Left lower leg Body parts bathed by helper: Back Bathing not applicable: Back Assist Level: Touching or steadying assistance(Pt > 75%)  Function- Upper Body Dressing/Undressing What is the patient wearing?: Pull over shirt/dress Pull over shirt/dress - Perfomed by patient:  Thread/unthread right sleeve, Thread/unthread left sleeve, Put head through opening, Pull shirt over trunk Assist Level: Supervision or verbal cues Function - Lower Body Dressing/Undressing What is the patient wearing?: Underwear, Pants, Socks, Shoes Position: Sitting EOB Underwear - Performed by patient: Thread/unthread right underwear leg, Thread/unthread left underwear leg, Pull underwear up/down Pants- Performed by patient: Thread/unthread right pants leg, Thread/unthread left pants leg, Pull pants up/down, Fasten/unfasten pants Socks - Performed by patient: Don/doff right sock, Don/doff left sock Socks -  Performed by helper: Don/doff right sock, Don/doff left sock Shoes - Performed by patient: Don/doff right shoe, Don/doff left shoe Shoes - Performed by helper: Don/doff right shoe, Don/doff left shoe, Fasten right, Fasten left Assist for footwear: Supervision/touching assist Assist for lower body dressing: Touching or steadying assistance (Pt > 75%)  Function - Toileting Toileting steps completed by patient: Adjust clothing prior to toileting, Adjust clothing after toileting, Performs perineal hygiene Toileting Assistive Devices: Grab bar or rail Assist level: Supervision or verbal cues  Function - Air cabin crew transfer assistive device: Grab bar, Pension scheme manager level to toilet: Supervision or verbal cues Assist level from toilet: Supervision or verbal cues  Function - Chair/bed transfer Chair/bed transfer method: Stand pivot Chair/bed transfer assist level: Supervision or verbal cues Chair/bed transfer assistive device: Armrests Chair/bed transfer details: Verbal cues for technique, Verbal cues for sequencing, Verbal cues for safe use of DME/AE  Function - Locomotion: Wheelchair Will patient use wheelchair at discharge?: No Type: Manual Max wheelchair distance: 250 ft Assist Level: Supervision or verbal cues Assist Level: Supervision or verbal cues Assist Level:  Supervision or verbal cues Function - Locomotion: Ambulation Assistive device: Walker-rolling Max distance: 150' Assist level: Touching or steadying assistance (Pt > 75%) Assist level: Touching or steadying assistance (Pt > 75%) Assist level: Touching or steadying assistance (Pt > 75%) Assist level: Touching or steadying assistance (Pt > 75%) Assist level: Touching or steadying assistance (Pt > 75%)  Function - Comprehension Comprehension: Auditory Comprehension assist level: Follows complex conversation/direction with extra time/assistive device  Function - Expression Expression: Verbal Expression assist level: Expresses basic 90% of the time/requires cueing < 10% of the time.  Function - Social Interaction Social Interaction assist level: Interacts appropriately with others - No medications needed.  Function - Problem Solving Problem solving assist level: Solves complex 90% of the time/cues < 10% of the time  Function - Memory Memory assist level: Recognizes or recalls 50 - 74% of the time/requires cueing 25 - 49% of the time Patient normally able to recall (first 3 days only): Current season, Location of own room, Staff names and faces, That he or she is in a hospital  Medical Problem List and Plan: 1.  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  Left lower limb ataxia and trunkal ataxia, discussed team conf result   2.  H/o PE/Anticoagulation: Pharmaceutical: Other (comment)--coumadin changed to Eliquis. Discussed bleeding risk 3. Cervical spondylosis/Achilles tenditinits/Pain Management: Was getting outpatient PT to help with pain and right ankle pain. Tylenol prn 4. Mood: LCSW to monitor for evaluation and support.  Pt concerned about his wife with "bad knee" 5. Neuropsych: This patient is capable of making decisions on his own behalf. 6. Skin/Wound Care: Routine pressure relief measures 7. Fluids/Electrolytes/Nutrition:  Monitor I/O. Check lytes in am. Encourage fluid intake to avoid dehydration.   8.  CAD with chronic diastolic CHF-compensated: Monitor for signs of overload. Check weight daily. Low salt diet. Symptoms managed on Imdur, Lasix, metoprolol and Crestor.   9. 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 T2DM: Hgb A1C-5.7--well controlled on diet. Will check BS BS ac/hs and use SSI for elevated BS--fairly stable can reduce cbg checks to daily . CBG (last 3)   Recent Labs  06/26/15 1659 06/26/15 2123 06/27/15 0713  GLUCAP 179* 158* 106*        12. GERD with nausea and postprandial fullness:  Symptoms managed at this time  on continue Nexium BID.   13. CKD?: Creatinine at -1.5 earlier this year and metformin d/c.   14. HTN: Monitor with increased activity.  Allow for permissive HTN , BPs running on low side, reduced prinivil dose to 11m on 5/18, BPs im better range ,also on toprol XL 265mdaily  Filed Vitals:   06/27/15 0809 06/27/15 0811  BP: 138/74 138/74  Pulse:  56  Temp:    Resp:        LOS (Days) 3 A FACE TO FACE EVALUATION WAS PERFORMED  Anguel Delapena E 06/27/2015, 8:14 AM

## 2015-06-27 NOTE — IPOC Note (Signed)
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)   Ataxia, post-stroke   Gait disturbance, post-stroke   Proximal leg weakness   Cervical spondylosis without myelopathy   Achilles tendinitis of right lower extremity   Chronic diastolic CHF (congestive heart failure) (HCC)   Gastroesophageal reflux disease without esophagitis   Chronic kidney disease     Functional Problem List: Nursing Medication Management, Safety, Pain, Skin Integrity  PT Balance, Endurance, Pain, Safety  OT Balance, Endurance, Motor, Vision  SLP Linguistic  TR         Basic ADL's: OT Bathing, Dressing, Toileting     Advanced  ADL's: OT       Transfers: PT Bed Mobility, Bed to Chair, Floor, Furniture, Teacher, early years/pre, Metallurgist: PT Ambulation, Stairs     Additional Impairments: OT None  SLP Communication expression    TR      Anticipated Outcomes Item Anticipated Outcome  Self Feeding I  Swallowing      Basic self-care  S  Toileting  mod I   Bathroom Transfers S  Bowel/Bladder  supervision  Transfers  Mod I  Locomotion  Mod I   Communication  independent   Cognition     Pain  less<3   Safety/Judgment  min assist up with walker   Therapy Plan: PT Intensity: Minimum of 1-2 x/day ,45 to 90 minutes PT Frequency: 5 out of 7 days PT Duration Estimated Length of Stay: 7-10 days OT Intensity: Minimum of 1-2 x/day, 45 to 90 minutes OT Frequency: 5 out of 7 days OT Duration/Estimated Length of Stay: 7-10 days SLP Intensity: Minumum of 1-2 x/day, 30 to 90 minutes SLP Frequency: 1 to 3 out of 7 days SLP Duration/Estimated Length of Stay: 7-10 days        Team Interventions: Nursing Interventions Patient/Family Education, Disease Management/Prevention, Pain Management, Skin Care/Wound Management,  Medication Management, Discharge Planning  PT interventions Ambulation/gait training, Discharge planning, Functional mobility training, Psychosocial support, Therapeutic Activities, Visual/perceptual remediation/compensation, Wheelchair propulsion/positioning, Therapeutic Exercise, Neuromuscular re-education, Disease management/prevention, Training and development officer, Cognitive remediation/compensation, DME/adaptive equipment instruction, Pain management, Splinting/orthotics, UE/LE Strength taining/ROM, Community reintegration, Technical sales engineer stimulation, Patient/family education, IT trainer, UE/LE Coordination activities  OT Interventions Training and development officer, Discharge planning, Functional mobility training, Self Care/advanced ADL retraining, Patient/family education, Therapeutic Activities, Therapeutic Exercise, Visual/perceptual remediation/compensation  SLP Interventions Cueing hierarchy, Patient/family education, Speech/Language facilitation  TR Interventions    SW/CM Interventions Discharge Planning, Psychosocial Support, Patient/Family Education    Team Discharge Planning: Destination: PT-Home ,OT- Home , SLP-Home Projected Follow-up: PT-Outpatient PT, OT-  Home health OT, SLP-None Projected Equipment Needs: PT- (need for RW to be determined), OT- Tub/shower seat, SLP-None recommended by SLP Equipment Details: PT- , OT-  Patient/family involved in discharge planning: PT- Patient,  OT-Patient, SLP-Patient, Family member/caregiver  MD ELOS: 5-7d Medical Rehab Prognosis:  Good Assessment: 75 year old male with history of Myasthenia Gravis--prednisone snd pyridostigmine, diffuse CAD, 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 He presented to [facility] ED 5/10 for work up and evaluation. INR at admission 2 and MRI brain done revealing acute  stroke involving inferior left cerebellum. CTA head and neck showed acute/early subacute left cerebellar infarct, proximal left V2 vertebral  artery occlusion with intermittent distal reconstitution and patent dominant R-VA. 2D echo with EF 55-60% with no regional wall abnormality, elevated filling pressures and grade 1 diastolic dysfunction. Neurology felt that stroke etiology could be cardioembolic due to MAT on exam v/s large vessel disease due to chronic L-VA occlusion with intermittent reconstitution. Follow up CT head 05/14 showed evolving acute left PICA infarct without propagation or hemorrhagic conversion. Coumadin changed to Eliquis and patient to follow up with Dr. Jannifer Franklin after discharge   Now requiring 24/7 Rehab RN,MD, as well as CIR level PT, OT and SLP.  Treatment team will focus on ADLs and mobility with goals set at sup  See Team Conference Notes for weekly updates to the plan of care

## 2015-06-27 NOTE — Progress Notes (Signed)
Occupational Therapy Session Note  Patient Details  Name: [name] MRN: [id number] Date of Birth: Mar 17, 1940  Today's Date: 06/27/2015 OT Individual Time: [id number] OT Individual Time Calculation (min): 30 min    Short Term Goals: Week 1:  OT Short Term Goal 1 (Week 1): STGs = LTGs  Skilled Therapeutic Interventions/Progress Updates: Patient completed toilet transfers with distant S via RW and toileting with supervision with his wife.   They demonstrated good safety awareness.  As well, he was seen for vestibular visual rehabilitation this session.   He was able to demonstrate competence in completing the home exercise program.  He did become nauseated and dizzy when he completed standing vertical exercises.   The dizziness resolved and he was offered meds and gingerale for the nausea.   He resorted to gingerale only -wanted no meds.  Patient was left in the care of his supportive wife and cal bell at the end of the session.     Therapy Documentation Precautions:  Precautions Precautions: Fall Precaution Comments: vertigo Restrictions Weight Bearing Restrictions: No    See Function Navigator for Current Functional Status.   Therapy/Group: Individual Therapy  Alfredia Ferguson [facility] 06/27/2015, 12:52 PM

## 2015-06-27 NOTE — Progress Notes (Signed)
Occupational Therapy Session Note  Patient Details  Name: [name] MRN: [id number] Date of Birth: May 30, 1940  Today's Date: 06/27/2015 OT Individual Time: 0930-1030 OT Individual Time Calculation (min): 60 min    Short Term Goals: No short term goals set  LTGs of standing balance, bathing, dressing, toileting, and toilet transfers upgraded from S to mod I.  Skilled Therapeutic Interventions/Progress Updates:  Pt seen for skilled OT to facilitate dynamic balance and visual skills with ADL retraining. Pt ambulated to bathroom with RW with S and showered with set up only. Pt demonstrated improved balance and was able to complete self care with set up/S only. Discussed with pt upgrading his goals from S to mod I as his vertigo is improving. Pt very pleased.   Pt ambulated to gym to use Biodex for balance and visual fixation training with the weight shift and limits of stability training tools. Pt enjoyed activity and was eager to increase level of challenge. Pt progressed from level static to level 6.  Pt did feel slightly dizzy at end of activity and needed to stand to rest for a few minutes. Pt then ambulated back to room with all needs met.   Therapy Documentation Precautions:  Precautions Precautions: Fall Precaution Comments: vertigo Restrictions Weight Bearing Restrictions: No   Pain: Pain Assessment Pain Assessment: No/denies pain ADL: ADL ADL Comments: refer to functional navigator  See Function Navigator for Current Functional Status.   Therapy/Group: Individual Therapy  Enoch 06/27/2015, 12:17 PM

## 2015-06-27 NOTE — Progress Notes (Signed)
Occupational Therapy Session Note  Patient Details  Name: [name] MRN: [id number] Date of Birth: [birth year]  Today's Date: 06/27/2015 OT Individual Time: 1330-1430 OT Individual Time Calculation (min): 60 min    Short Term Goals: Week 1:  OT Short Term Goal 1 (Week 1): STGs = LTGs  Skilled Therapeutic Interventions/Progress Updates:    Pt ambulated to the ADL apartment during session with min guard assist and no assistive device.  He was able to make up a standard bed with supervision and then vacuum the floor with close supervision as well.  Pt with report of dizziness at 3/10 with bending over.  Decreased to less than 1/10 with rest.  Worked on Biodex in the gym with use of Limits of Stability program.  First interval completed with stable surface with 25% accuracy and then 13% accuracy with unstable floor.  Progressed to standing on foam surface with tossing and catching of a beach ball against a wall.  Pt needing close supervision for balance.  Also worked on standing with eyes closed and holding onto the ball.  Min assist level needed for balance.  Finished session with ambulation over mulch surface with supervision and then return to the room.  Pt left in bedside chair with call button and phone in reach and visitors present.    Therapy Documentation Precautions:  Precautions Precautions: Fall Precaution Comments: vertigo Restrictions Weight Bearing Restrictions: No  Pain: Pain Assessment Pain Assessment: No/denies pain ADL: See Function Navigator for Current Functional Status.   Therapy/Group: Individual Therapy  [name] OTR/L 06/27/2015, 3:38 PM

## 2015-06-27 NOTE — Progress Notes (Signed)
Physical Therapy Note  Patient Details  Name: [name] MRN: [id number] Date of Birth: 1940/11/28 Today's Date: 06/27/2015  1120-1207, 47 min individual  No pain reported.  Gait without use of AD, to/from room, transporting folded RW in UEs. Up/down 18 (2 flights) steps in stairwell, L rail ascending.  Pt alternated feet when ascending; initially did not alternate feet when descending, but self selected alternation on second flight.  Pt displayed some problems with eccentric control when descending, but was safe using railing.  neuromuscular re-education via demo, vcs for bil rowing in sitting using 4# weighted bar, 2 x 10;  Self stretching bil hip ERs in sitting with 5 second holds at end range, 2 x 10, and seated leg(thigh) crossing with UE pulling thigh across lap, x 2 min R/L.Marland Kitchen Reciprocal scooting and lateral leans with R/L pelvic protraction/retraction to dissociate pelvis.  5TSTS = 15 seconds. Biased to R standing during vertical head turns and horizontal head turns; vertigo elicited often with horizontal turns with pt displaying startle reaction bil UEs; episodes passed quickly. Pt did not have LOB or over shift to R.  Pt left resting in recliner in room, with all needs within reach. Hand out provided for self stretching exs.  Yancy Hascall 06/27/2015, 7:52 AM

## 2015-06-28 ENCOUNTER — Inpatient Hospital Stay (HOSPITAL_COMMUNITY): Payer: Medicare Other | Admitting: *Deleted

## 2015-06-28 ENCOUNTER — Inpatient Hospital Stay (HOSPITAL_COMMUNITY): Payer: Medicare Other | Admitting: Occupational Therapy

## 2015-06-28 ENCOUNTER — Inpatient Hospital Stay (HOSPITAL_COMMUNITY): Payer: Medicare Other | Admitting: Speech Pathology

## 2015-06-28 LAB — GLUCOSE, CAPILLARY
GLUCOSE-CAPILLARY: 92 mg/dL (ref 65–99)
GLUCOSE-CAPILLARY: 134 mg/dL — AB (ref 65–99)
GLUCOSE-CAPILLARY: 174 mg/dL — AB (ref 65–99)
GLUCOSE-CAPILLARY: 159 mg/dL — AB (ref 65–99)

## 2015-06-28 MED ORDER — LIDOCAINE 5 % EX PTCH: MEDICATED_PATCH | CUTANEOUS | Status: DC

## 2015-06-28 MED ORDER — LISINOPRIL 5 MG PO TABS: 5.0000 mg | ORAL_TABLET | Freq: Every day | ORAL | Status: DC

## 2015-06-28 MED ORDER — DOCUSATE SODIUM 100 MG PO CAPS: 100.0000 mg | ORAL_CAPSULE | Freq: Two times a day (BID) | ORAL | Status: DC

## 2015-06-28 MED ORDER — PROMETHAZINE HCL 12.5 MG PO TABS: 12.5000 mg | ORAL_TABLET | Freq: Four times a day (QID) | ORAL | Status: DC | PRN

## 2015-06-28 NOTE — Plan of Care (Signed)
Problem: RH PAIN MANAGEMENT Goal: RH STG PAIN MANAGED AT OR BELOW PT'S PAIN GOAL <2  Outcome: Completed/Met Date Met:  06/28/15 Managed with Lidoderm patch

## 2015-06-28 NOTE — Progress Notes (Signed)
Physical Therapy Discharge Summary  Patient Details  Name: [name] MRN: [id number] Date of Birth: May 23, 1940  Today's Date: 06/28/2015 PT Individual Time: 0700-0800 and 13:45-14:30  PT Individual Time Calculation (min): 60 min and 81mn    Patient has met 9 of 10 long term goals due to improved activity tolerance, improved balance, improved postural control, increased strength and improved coordination.  Patient to discharge at an ambulatory level Modified Independent.   Patient's care partner is independent to provide the necessary supervision assistance at discharge.  Reasons goals not met: Pt continues to need occasional S in community settings due to impaired balance, but is able to manage safely with this level of care.   Recommendation:  Patient will benefit from ongoing skilled PT services in home health setting to continue to advance safe functional mobility, address ongoing impairments in righting reactions, motor control/coordination, and activity tolerance, and minimize fall risk.  Equipment: RW  Reasons for discharge: treatment goals met and discharge from hospital  Patient/family agrees with progress made and goals achieved: Yes   First Tx focused on functional mobility training, gait with RW, and NMR via forced use, manual facilitation, and multi-modal cues. Pt up in room, performed mobility for gathering items at various levels, toileting, washing at si k with mod I, using AD as needed. Reviewd progress toward goals and mobiltiy in/around home. Pt participated in community gait and mobility 3x200' with RW and close S over grass, mulch, curb, and inclines.  Pt able to navigate busy settings and tight spaces with S. Pt had one LOB, but was able to recover. Discussed typical community access and trouble shooting possible difficulties. Performed transfers from varying surfaces and furniture.  Floor transfer was demonstrated and discussed fall risk reduction and what to do in  case of a fall. Pt was able to return-demonstrate fall recovery with S cues.  Stair training included 12 stairs with 1 rail and S cues for safety.   Second tx focused on car transfer, reaching object from floor, dynamic gait without device, and Nustep for activity tolerance. Pt able to walk 150' with Mod I and RW in controlled setting. Pt challenged with dynamic balance without device for retro walking, tossing ball to walk, quick turns, head turns, kicking ball, tossing ball, and changes in pace with S.  Nustep x10 min with level 4 and all extremities. Pt performed HEP seated stretching 1x139m each per handouts. Pt left up in room, Mod I with safety plan updated.    PT Discharge Precautions/Restrictions Precautions Precautions: Fall Precaution Comments: vertigo Restrictions Weight Bearing Restrictions: No Vital Signs Therapy Vitals Temp: 98.4 F (36.9 C) Temp Source: Oral Pulse Rate: 61 Resp: 16 BP: 135/70 mmHg Patient Position (if appropriate): Sitting Oxygen Therapy SpO2: 98 % O2 Device: Not Delivered Pain Pain Assessment Pain Assessment: No/denies pain Vision/Perception  Vision - History Baseline Vision: Wears glasses only for reading Visual History: Cataracts;Other (comment) ("pressure in the eyes," bu tnot glaucoma) Patient Visual Report: Blurring of vision;Eye fatigue/eye pain/headache Vision - Assessment Eye Alignment: Impaired (comment) Tracking/Visual Pursuits: Decreased smoothness of horizontal tracking;Decreased smoothness of vertical tracking Additional Comments: dizziness excerbated with VOR testing Perception Perception: Within Functional Limits  Cognition Overall Cognitive Status: Within Functional Limits for tasks assessed Arousal/Alertness: Awake/alert Orientation Level: Oriented X4 Sensation Sensation Light Touch: Appears Intact Stereognosis: Appears Intact Hot/Cold: Appears Intact Proprioception: Appears Intact Coordination Gross Motor Movements  are Fluid and Coordinated: No Fine Motor Movements are Fluid and Coordinated: Yes Coordination and Movement Description:  slow speed and excursion, but functional and pt safely compensates Heel Shin Test: coordination intact BLE Motor  Motor Motor: Hemiplegia Motor - Skilled Clinical Observations: generalized muscle weakness (RLE weaker than LLE, as well as less coordination of timing ) Motor - Discharge Observations: Functional, but impaired accuracy and excursion of RLE motions  Mobility Bed Mobility Rolling Right: 7: Independent Rolling Left: 7: Independent Supine to Sit: 7: Independent Sit to Supine: 7: Independent Transfers Transfers: Yes Sit to Stand: 6: Modified independent (Device/Increase time) Stand Pivot Transfers: 6: Modified independent (Device/Increase time) Locomotion  Ambulation Ambulation: Yes Ambulation/Gait Assistance: 6:Mod I in controlled environment, S in Advice worker (Feet): 500 Feet Assistive device: Rolling walker Ambulation/Gait Assistance Details: Verbal cues for precautions/safety Ambulation/Gait Assistance Details: Pt had some narrowed BOS in busy environments and during turns, but able to modify with cues for safety Gait Gait: Yes Gait velocity: 2.98'/second wiht RW for 10MWT Wheelchair Mobility Wheelchair Mobility: No  Trunk/Postural Assessment  Cervical Assessment Cervical Assessment: Within Functional Limits Thoracic Assessment Thoracic Assessment: Within Functional Limits Lumbar Assessment Lumbar Assessment: Within Functional Limits Postural Control Postural Control: Within Functional Limits  Balance Balance Balance Assessed: Yes Standardized Balance Assessment Standardized Balance Assessment: Berg Balance Test Berg Balance Test Sit to Stand: Able to stand  independently using hands Standing Unsupported: Able to stand 2 minutes with supervision Sitting with Back Unsupported but Feet Supported on Floor or Stool: Able  to sit safely and securely 2 minutes Stand to Sit: Sits safely with minimal use of hands Transfers: Able to transfer with verbal cueing and /or supervision Standing Unsupported with Eyes Closed: Able to stand 10 seconds with supervision Standing Ubsupported with Feet Together: Able to place feet together independently and stand for 1 minute with supervision From Standing, Reach Forward with Outstretched Arm: Reaches forward but needs supervision From Standing Position, Pick up Object from Floor: Unable to try/needs assist to keep balance From Standing Position, Turn to Look Behind Over each Shoulder: Needs supervision when turning Turn 360 Degrees: Needs close supervision or verbal cueing Standing Unsupported, Alternately Place Feet on Step/Stool: Able to complete 4 steps without aid or supervision Standing Unsupported, One Foot in Front: Needs help to step but can hold 15 seconds Standing on One Leg: Able to lift leg independently and hold equal to or more than 3 seconds Total Score: 30 Dynamic Sitting Balance Dynamic Sitting - Level of Assistance: 6: Modified independent (Device/Increase time) Sitting balance - Comments: Functional sitting balance with safe compensations  Static Standing Balance Static Standing - Level of Assistance: 6: Modified independent (Device/Increase time) Dynamic Standing Balance Dynamic Standing - Level of Assistance: 6: Modified independent (Device/Increase time) Extremity Assessment  RUE Assessment RUE Assessment: Exceptions to [facility] RUE Strength RUE Overall Strength Comments: sh flex 4-/5, grasp 4/5 LUE Assessment LUE Assessment: Exceptions to [facility] LUE Strength LUE Overall Strength Comments: sh flex 3+/5 (old RTC surgery), grasp 4/5 RLE AROM (degrees) Overall AROM Right Lower Extremity: Within functional limits for tasks assessed RLE Strength Right Hip Flexion: 3+/5 Right Knee Flexion: 4/5 Right Knee Extension: 4-/5 Right Ankle Dorsiflexion: 4-/5 LLE  Assessment LLE Assessment: Within Functional Limits   See Function Navigator for Current Functional Status.  Tobias Avitabile, Corinna Lines, PT, DPT  06/28/2015, 7:49 AM

## 2015-06-28 NOTE — Progress Notes (Signed)
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 He presented to [facility] ED 5/10 for work up and evaluation. 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 rate 16, height 5\' 7"  (1.702 m), weight 79.153 kg (174 lb 8 oz), SpO2 99 %. No results found. Results for orders placed or performed during the hospital encounter of 06/24/15 (from the past 72 hour(s))  Glucose, capillary     Status: Abnormal   Collection Time: 06/25/15 12:01 PM  Result Value Ref Range   Glucose-Capillary 125 (H) 65 - 99 mg/dL  Glucose, capillary     Status: Abnormal   Collection Time: 06/25/15  4:23 PM  Result Value Ref Range   Glucose-Capillary 132 (H) 65 - 99 mg/dL  Glucose, capillary     Status: Abnormal   Collection Time: 06/25/15  9:41 PM  Result Value Ref Range   Glucose-Capillary 164 (H) 65 - 99 mg/dL  Glucose, capillary     Status: Abnormal   Collection Time: 06/26/15  6:51 AM  Result Value Ref Range   Glucose-Capillary 108 (H) 65 - 99 mg/dL  Glucose, capillary     Status: Abnormal   Collection Time: 06/26/15 11:53 AM  Result Value Ref Range   Glucose-Capillary 114 (H) 65 - 99 mg/dL   Comment 1 Notify RN   Glucose, capillary     Status: Abnormal   Collection Time: 06/26/15  4:59 PM  Result Value Ref Range   Glucose-Capillary 179 (H) 65 - 99 mg/dL   Comment 1 Notify RN   Glucose, capillary     Status: Abnormal   Collection Time: 06/26/15  9:23 PM  Result Value Ref Range   Glucose-Capillary 158 (H) 65 - 99 mg/dL   Glucose, capillary     Status: Abnormal   Collection Time: 06/27/15  7:13 AM  Result Value Ref Range   Glucose-Capillary 106 (H) 65 - 99 mg/dL  Glucose, capillary     Status: Abnormal   Collection Time: 06/27/15 12:21 PM  Result Value Ref Range   Glucose-Capillary 127 (H) 65 - 99 mg/dL   Comment 1 Notify RN   Glucose, capillary     Status: Abnormal   Collection Time: 06/27/15  4:46 PM  Result Value Ref Range   Glucose-Capillary 146 (H) 65 - 99 mg/dL   Comment 1 Notify RN   Glucose, capillary     Status: Abnormal   Collection Time: 06/27/15  9:33 PM  Result Value Ref Range   Glucose-Capillary 116 (H) 65 - 99 mg/dL  Glucose, capillary     Status: None   Collection Time: 06/28/15  6:49 AM  Result Value Ref Range   Glucose-Capillary 92 65 - 99 mg/dL     HEENT: normal Cardio: RRR Resp: CTA B/L GI: BS positive Extremity:  Pulses positive Skin:   Intact Neuro: Alert/Oriented  Musc/Skel:  Normal Gen NAD 5/5 in BUE and 4/5 BLE Ataxia Left Heel shin  Assessment/Plan: 1. Functional deficits secondary to Left lower limb ataxia, trunkal ataxia, L PICA infarct which require 3+ hours per day of interdisciplinary therapy  in a comprehensive inpatient rehab setting. Physiatrist is providing close team supervision and 24 hour management of active medical problems listed below. Physiatrist and rehab team continue to assess barriers to discharge/monitor patient progress toward functional and medical goals.  Plan Transitional care visit in 2 weeks FIM: Function - Bathing Position: Shower Body parts bathed by patient: Right arm, Left arm, Chest, Abdomen, Front perineal area, Buttocks, Right upper leg, Left upper leg, Right lower leg, Left lower leg Body parts bathed by helper: Back Bathing not applicable: Back Assist Level: More than reasonable time  Function- Upper Body Dressing/Undressing What is the patient wearing?: Pull over shirt/dress Pull over shirt/dress - Perfomed by patient:  Thread/unthread right sleeve, Thread/unthread left sleeve, Put head through opening, Pull shirt over trunk Assist Level: Set up Function - Lower Body Dressing/Undressing What is the patient wearing?: Underwear, Pants, Socks, Shoes Position: Sitting EOB Underwear - Performed by patient: Thread/unthread right underwear leg, Thread/unthread left underwear leg, Pull underwear up/down Pants- Performed by patient: Thread/unthread right pants leg, Thread/unthread left pants leg, Pull pants up/down, Fasten/unfasten pants Socks - Performed by patient: Don/doff right sock, Don/doff left sock Socks - Performed by helper: Don/doff right sock, Don/doff left sock Shoes - Performed by patient: Don/doff right shoe, Don/doff left shoe Shoes - Performed by helper: Don/doff right shoe, Don/doff left shoe, Fasten right, Fasten left Assist for footwear: Setup Assist for lower body dressing: Supervision or verbal cues  Function - Toileting Toileting steps completed by patient: Adjust clothing prior to toileting, Adjust clothing after toileting, Performs perineal hygiene Toileting Assistive Devices: Grab bar or rail Assist level: Touching or steadying assistance (Pt.75%)  Function - Air cabin crew transfer assistive device: Walker, Grab bar Assist level to toilet: Supervision or verbal cues Assist level from toilet: Supervision or verbal cues  Function - Chair/bed transfer Chair/bed transfer method: Stand pivot Chair/bed transfer assist level: Supervision or verbal cues Chair/bed transfer assistive device: Armrests Chair/bed transfer details: Verbal cues for technique, Verbal cues for sequencing, Verbal cues for safe use of DME/AE  Function - Locomotion: Wheelchair Will patient use wheelchair at discharge?: No Type: Manual Max wheelchair distance: 250 ft Assist Level: Supervision or verbal cues Assist Level: Supervision or verbal cues Assist Level: Supervision or verbal cues Function -  Locomotion: Ambulation Assistive device: No device Max distance: 150 Assist level: Supervision or verbal cues Assist level: Supervision or verbal cues Assist level: Supervision or verbal cues Assist level: Supervision or verbal cues Assist level: Touching or steadying assistance (Pt > 75%)  Function - Comprehension Comprehension: Auditory Comprehension assist level: Follows complex conversation/direction with extra time/assistive device  Function - Expression Expression: Verbal Expression assist level: Expresses complex ideas: With extra time/assistive device  Function - Social Interaction Social Interaction assist level: Interacts appropriately with others - No medications needed.  Function - Problem Solving Problem solving assist level: Solves complex 90% of the time/cues < 10% of the time  Function - Memory Memory assist level: More than reasonable amount of time Patient normally able to recall (first 3 days only): Current season, Location of own room, Staff names and faces, That he or she is in a hospital  Medical Problem List and Plan: 1.  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  Left lower limb ataxia and trunkal ataxia, plan d/c 5/20   2.  H/o PE/Anticoagulation: Pharmaceutical: Other (comment)--coumadin changed to Eliquis.  3. Cervical spondylosis/Achilles tenditinits/Pain Management: Was getting outpatient PT to help with pain and  right ankle pain. Tylenol prn 4. Mood: LCSW to monitor for evaluation and support.  Pt concerned about his wife with "bad knee" 5. Neuropsych: This patient is capable of making decisions on his own behalf. 6. Skin/Wound Care: Routine pressure relief measures 7. Fluids/Electrolytes/Nutrition: Monitor I/O. Check lytes in am. Encourage fluid intake to avoid dehydration.   8.  CAD with chronic diastolic CHF-compensated: Monitor for signs of overload. Check weight daily. Low salt diet. Symptoms  managed on Imdur, Lasix, metoprolol and Crestor.   9. 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 T2DM: Hgb A1C-5.7--well controlled on diet. Will check BS BS ac/hs and use SSI for elevated BS--fairly stable can reduce cbg checks to daily . CBG (last 3)   Recent Labs  06/27/15 1646 06/27/15 2133 06/28/15 0649  GLUCAP 146* 116* 92        12. GERD with nausea and postprandial fullness:  Symptoms managed at this time on continue Nexium BID.   13. CKD?: Creatinine at -1.5 earlier this year and metformin d/c.   14. HTN: Monitor with increased activity.  Allow for permissive HTN , BPs running on low side, reduced prinivil dose to 5mg  on 5/18, BPs im better range ,also on toprol XL 25mg  daily , will f/u PCP Filed Vitals:   06/27/15 1619 06/28/15 0523  BP: 126/65 151/73  Pulse: 58 64  Temp: 97.9 F (36.6 C) 98.4 F (36.9 C)  Resp: 19 16      LOS (Days) 4 A FACE TO FACE EVALUATION WAS PERFORMED  KIRSTEINS,ANDREW E 06/28/2015, 7:46 AM

## 2015-06-28 NOTE — Discharge Instructions (Signed)
Inpatient Rehab Discharge Instructions  [name] Discharge date and time:  06/29/15  Activities/Precautions/ Functional Status: Activity: no lifting, driving, or strenuous exercise  till cleared by MD. Diet: diabetic diet Wound Care: none needed   Functional status:  ___ No restrictions     ___ Walk up steps independently ___ 24/7 supervision/assistance   ___ Walk up steps with assistance _X__ Intermittent supervision/assistance  ___ Bathe/dress independently _X__ Walk with walker     ___ Bathe/dress with assistance ___ Walk Independently    ___ Shower independently ___ Walk with assistance    ___ Shower with assistance ___ No alcohol     ___ Return to work/school ________  Special Instructions:    COMMUNITY REFERRALS UPON DISCHARGE:    Home Health:   PT, OT, [place]   Date of last service:06/29/2015  Medical Equipment/Items [place]   [phone]   GENERAL COMMUNITY RESOURCES FOR PATIENT/FAMILY: Support Groups:[facility] SUPPORT GROUP THIRD Thursday [place] @ 6:30 PM  [place] [phone]  STROKE/TIA DISCHARGE INSTRUCTIONS SMOKING Cigarette smoking nearly doubles your risk of having a stroke & is the single most alterable risk factor  If you smoke or have smoked in the last 12 months, you are advised to quit smoking for your health.  Most of the excess cardiovascular risk related to smoking disappears within a year of stopping.  Ask you doctor about anti-smoking medications  [place] Quit Line: 1-800-QUIT NOW  Free Smoking Cessation Classes [phone]  CHOLESTEROL Know your levels; limit fat & cholesterol in your diet  Lipid Panel     Component Value Date/Time   CHOL 104 06/21/2015 0352   TRIG 129 06/21/2015 0352   HDL 26* 06/21/2015 0352   CHOLHDL 4.0 06/21/2015 0352   VLDL 26 06/21/2015 0352   LDLCALC 52 06/21/2015 0352      Many  patients benefit from treatment even if their cholesterol is at goal.  Goal: Total Cholesterol (CHOL) less than 160  Goal:  Triglycerides (TRIG) less than 150  Goal:  HDL greater than 40  Goal:  LDL (LDLCALC) less than 100   BLOOD PRESSURE American Stroke Association blood pressure target is less that 120/80 mm/Hg  Your discharge blood pressure is:  BP: 135/64 mmHg  Monitor your blood pressure  Limit your salt and alcohol intake  Many individuals will require more than one medication for high blood pressure  DIABETES (A1c is a blood sugar average for last 3 months) Goal HGBA1c is under 7% (HBGA1c is blood sugar average for last 3 months)  Diabetes:     Lab Results  Component Value Date   HGBA1C 5.7* 06/21/2015     Your HGBA1c can be lowered with medications, healthy diet, and exercise.  Check your blood sugar as directed by your physician  Call your physician if you experience unexplained or low blood sugars.  PHYSICAL ACTIVITY/REHABILITATION Goal is 30 minutes at least 4 days per week  Activity: No driving, Therapies: See above Return to work:  N/A  Activity decreases your risk of heart attack and stroke and makes your heart stronger.  It helps control your weight and blood pressure; helps you relax and can improve your mood.  Participate in a regular exercise program.  Talk with your doctor about the best form of exercise for you (dancing, walking, swimming, cycling).  DIET/WEIGHT Goal is to maintain a healthy weight  Your discharge diet is: Diet heart healthy/carb  modified Room service appropriate?: Yes; Fluid consistency:: Thin liquids Your height is:  Height: 5\' 7"  (170.2 cm) Your current weight is: Weight: 80.332 kg (177 lb 1.6 oz) Your Body Mass Index (BMI) is:  BMI (Calculated): 28.6  Following the type of diet specifically designed for you will help prevent another stroke.  Your goal weight is:  159 lbs  Your goal Body Mass Index (BMI) is 19-24.  Healthy  food habits can help reduce 3 risk factors for stroke:  High cholesterol, hypertension, and excess weight.  RESOURCES Stroke/Support Group:  Call [phone]   STROKE EDUCATION PROVIDED/REVIEWED AND GIVEN TO PATIENT Stroke warning signs and symptoms How to activate emergency medical system (call 911). Medications prescribed at discharge. Need for follow-up after discharge. Personal risk factors for stroke. Pneumonia vaccine given:  Flu vaccine given:  My questions have been answered, the writing is legible, and I understand these instructions.  I will adhere to these goals & educational materials that have been provided to me after my discharge from the hospital.      My questions have been answered and I understand these instructions. I will adhere to these goals and the provided educational materials after my discharge from the hospital.  Patient/Caregiver Signature _______________________________ Date __________  Clinician Signature _______________________________________ Date __________  Please bring this form and your medication list with you to all your follow-up doctor's appointments.

## 2015-06-28 NOTE — Progress Notes (Signed)
Social Work  Discharge Note  The overall goal for the admission was met for:   Discharge location: Yes-HOME WITH WIFE WHO CAN PROVIDE SUPERVISION LEVEL  Length of Stay: Yes-5 DAYS  Discharge activity level: Yes-SUPERVISION/MOD/I LEVEL  Home/community participation: Yes  Services provided included: MD, RD, PT, OT, SLP, RN, CM, TR, Pharmacy and SW  Financial Services: Medicare and Private Insurance: [employer]  Follow-up services arranged: Home Health: [place], DME: [place] and Patient/Family has no preference for HH/DME agencies  Comments (or additional information):WIFE [place], [place]  Patient/Family verbalized understanding of follow-up arrangements: Yes  Individual responsible for coordination of the follow-up plan: SELF & [name]-WIFE  Confirmed correct DME delivered: Elease Hashimoto 06/28/2015    Elease Hashimoto

## 2015-06-28 NOTE — Progress Notes (Signed)
Speech Language Pathology Discharge Summary  Patient Details  Name: [name] MRN: [id number] Date of Birth: 09-Nov-1940  Today's Date: 06/28/2015 SLP Individual Time: 1100-1130 SLP Individual Time Calculation (min): 30 min   Skilled Therapeutic Interventions:  Skilled treatment session focused on communication goals. Pt able to engage in complex conversational tasks with 100% intelligibility with complete independence. Wife present and states that pt has made great progress. Pt is excited about meeting communication goals. He has effectively met 1 of 1 goals and is appropriate for discharge from skilled ST at this time. Pt was returned to room with wife and left with all needs within reach.     Patient has met 1 of 1 long term goals.  Patient to discharge at overall Independent level.  Reasons goals not met: N/A   Clinical Impression/Discharge Summary: Pt has made functional gains and has met 1 of 1 long term goals due to improved speech intelligibility. Pt is 100% intelligible at the complex conversation level, independently. Pt education completed with patient and his wife. Due to pt being at baseline intelligibility level, follow-up ST services are not indicated at this time.   Care Partner:  Caregiver Able to Provide Assistance: Yes     Recommendation:  None      Equipment: N/A  Reasons for discharge: Treatment goals met   Patient/Family Agrees with Progress Made and Goals Achieved: Yes   Function:   Cognition Comprehension Comprehension assist level: Follows complex conversation/direction with extra time/assistive device  Expression   Expression assist level: Expresses complex ideas: With extra time/assistive device  Social Interaction Social Interaction assist level: Interacts appropriately with others - No medications needed.  Problem Solving Problem solving assist level: Solves complex 90% of the time/cues < 10% of the time  Memory Memory assist level: More than  reasonable amount of time   Zayan Delvecchio 06/28/2015, 12:37 PM

## 2015-06-28 NOTE — Progress Notes (Signed)
Occupational Therapy Discharge Summary  Patient Details  Name: [name] MRN: [id number] Date of Birth: 06/09/1940  Patient has met 7 of 7 long term goals due to improved activity tolerance, improved balance, postural control and ability to compensate for deficits.  Patient to discharge at overall Modified Independent level.  Patient's care partner is independent to provide the necessary physical assistance at discharge.    Reasons goals not met: n/a  Recommendation:  Patient will benefit from ongoing skilled OT services in home health setting to continue to advance functional skills in the area of iADL.  Equipment: No equipment provided  Reasons for discharge: treatment goals met  Patient/family agrees with progress made and goals achieved: Yes  OT Discharge Precautions/Restrictions  Precautions Precautions: Fall Precaution Comments: vertigo Restrictions Weight Bearing Restrictions: No    Vital Signs Therapy Vitals Temp: 98.4 F (36.9 C) Temp Source: Oral Pulse Rate: 61 Resp: 16 BP: 135/70 mmHg Patient Position (if appropriate): Sitting Oxygen Therapy SpO2: 98 % O2 Device: Not Delivered Pain Pain Assessment Pain Assessment: No/denies pain ADL ADL ADL Comments: refer to functional navigator - Mod I with BADLs, S shower stall transfer Vision/Perception  Vision- Assessment Eye Alignment: Impaired (comment) Tracking/Visual Pursuits: Decreased smoothness of horizontal tracking;Decreased smoothness of vertical tracking Additional Comments: dizziness excerbated with VOR testing Perception Perception: Within Functional Limits  Cognition Overall Cognitive Status: Within Functional Limits for tasks assessed Arousal/Alertness: Awake/alert Orientation Level: Oriented X4 Sensation Sensation Light Touch: Appears Intact Stereognosis: Appears Intact Hot/Cold: Appears Intact Proprioception: Appears Intact Coordination Gross Motor Movements are Fluid and  Coordinated: No Fine Motor Movements are Fluid and Coordinated: Yes Coordination and Movement Description: slow speed and excursion, but functional and pt safely compensates Heel Shin Test: coordination intact BLE Motor  Motor Motor: Hemiplegia Motor - Skilled Clinical Observations: generalized muscle weakness (RLE weaker than LLE, as well as less coordination of timing ) Motor - Discharge Observations: Functional, but impaired accuracy and excursion of RLE motions Mobility  Bed Mobility Rolling Right: 7: Independent Rolling Left: 7: Independent Supine to Sit: 7: Independent Sit to Supine: 7: Independent Transfers Sit to Stand: 6: Modified independent (Device/Increase time)  Trunk/Postural Assessment  Cervical Assessment Cervical Assessment: Within Functional Limits Thoracic Assessment Thoracic Assessment: Within Functional Limits Lumbar Assessment Lumbar Assessment: Within Functional Limits Postural Control Postural Control: Within Functional Limits  Balance Balance Balance Assessed: Yes Standardized Balance Assessment Standardized Balance Assessment: Berg Balance Test Berg Balance Test Sit to Stand: Able to stand  independently using hands Standing Unsupported: Able to stand 2 minutes with supervision Sitting with Back Unsupported but Feet Supported on Floor or Stool: Able to sit safely and securely 2 minutes Stand to Sit: Sits safely with minimal use of hands Transfers: Able to transfer with verbal cueing and /or supervision Standing Unsupported with Eyes Closed: Able to stand 10 seconds with supervision Standing Ubsupported with Feet Together: Able to place feet together independently and stand for 1 minute with supervision From Standing, Reach Forward with Outstretched Arm: Reaches forward but needs supervision From Standing Position, Pick up Object from Floor: Unable to try/needs assist to keep balance From Standing Position, Turn to Look Behind Over each Shoulder:  Needs supervision when turning Turn 360 Degrees: Needs close supervision or verbal cueing Standing Unsupported, Alternately Place Feet on Step/Stool: Able to complete 4 steps without aid or supervision Standing Unsupported, One Foot in Front: Needs help to step but can hold 15 seconds Standing on One Leg: Able to lift leg independently and hold  equal to or more than 3 seconds Total Score: 30 Dynamic Sitting Balance Dynamic Sitting - Level of Assistance: 6: Modified independent (Device/Increase time) Sitting balance - Comments: Functional sitting balance with safe compensations  Static Standing Balance Static Standing - Level of Assistance: 6: Modified independent (Device/Increase time) Dynamic Standing Balance Dynamic Standing - Level of Assistance: 6: Modified independent (Device/Increase time) Extremity/Trunk Assessment RUE Assessment RUE Assessment: Exceptions to [facility] RUE Strength RUE Overall Strength Comments: sh flex 4-/5, grasp 4/5 LUE Assessment LUE Assessment: Exceptions to [facility] LUE Strength LUE Overall Strength Comments: sh flex 3+/5 (old RTC surgery), grasp 4/5   See Function Navigator for Current Functional Status.  [place] 06/28/2015, 8:26 AM

## 2015-06-28 NOTE — Progress Notes (Signed)
Occupational Therapy Session Note  Patient Details  Name: [name] MRN: [id number] Date of Birth: 1940/06/28  Today's Date: 06/28/2015 OT Individual Time: [id number] OT Individual Time Calculation (min): 75 min    Short Term Goals: No short term goals set  Skilled Therapeutic Interventions/Progress Updates:    Pt seen for ADL retraining with family education with his spouse. Pt completed all tasks of self care at a mod I level except for S with shower stall for safety. Pt's wife understands the need to cue pt to slow down and take breaks. Reviewed AE recommendations and use of memory foam bath mat.  Pt ambulated with RW to kitchen to practice his home exercise program using the kitchen counter for support. Pt practice fall prevention exercises. He then ambulated to the gym to practice 2nd part of his HEP of general strengthening exercises from chair level. Reviewed need to use visual fixation during exercises and work with the [facility] therapist on integrating vision exercises with physical movement. Pt is I with his visual exercises.  Pt ambulated back to his room. Pt and wife feel prepared for discharge.   Therapy Documentation Precautions:  Precautions Precautions: Fall Precaution Comments: vertigo Restrictions Weight Bearing Restrictions: No    Vital Signs: Therapy Vitals Temp: 98.4 F (36.9 C) Temp Source: Oral Pulse Rate: 61 Resp: 16 BP: 135/70 mmHg Patient Position (if appropriate): Sitting Oxygen Therapy SpO2: 98 % O2 Device: Not Delivered Pain: Pain Assessment Pain Assessment: No/denies pain ADL: ADL ADL Comments: refer to functional navigator    See Function Navigator for Current Functional Status.   Therapy/Group: Individual Therapy  [place] 06/28/2015, 8:26 AM

## 2015-06-29 DIAGNOSIS — I63119 Cerebral infarction due to embolism of unspecified vertebral artery: Secondary | ICD-10-CM

## 2015-06-29 DIAGNOSIS — N183 Chronic kidney disease, stage 3 (moderate): Secondary | ICD-10-CM

## 2015-06-29 LAB — GLUCOSE, CAPILLARY: GLUCOSE-CAPILLARY: 89 mg/dL (ref 65–99)

## 2015-06-29 NOTE — Progress Notes (Signed)
Patient and wife had already been given discharge instructions; all questions answered.  Medications in pharmacy were returned to patient.  Patient discharged home with wife.  Patient escorted via wheelchair by Rosebud Poles, NT to wife's vehicle.

## 2015-06-29 NOTE — Progress Notes (Signed)
[name] is a 75 y.o. male 27-Dec-1940 [id number]  Subjective: No new complaints. No new problems. Slept well. Feeling OK.  Objective: Vital signs in last 24 hours: Temp:  [97.8 F (36.6 C)-98.4 F (36.9 C)] 97.8 F (36.6 C) (05/20 0509) Pulse Rate:  [54-64] 54 (05/20 0509) Resp:  [18-20] 18 (05/20 0509) BP: (112-126)/(57-68) 125/57 mmHg (05/20 0509) SpO2:  [96 %-98 %] 96 % (05/20 0509) Weight change:  Last BM Date: 06/27/15  Intake/Output from previous day: 05/19 0701 - 05/20 0700 In: 1080 [P.O.:1080] Out: -  Last cbgs: CBG (last 3)   Recent Labs  06/28/15 1632 06/28/15 2032 06/29/15 0636  GLUCAP 174* 159* 89     Physical Exam General: No apparent distress   HEENT: not dry Lungs: Normal effort. Lungs clear to auscultation, no crackles or wheezes. Cardiovascular: Regular rate and rhythm, no edema Abdomen: S/NT/ND; BS(+) Musculoskeletal:  unchanged Neurological: No new neurological deficits Wounds: N/A    Skin: clear  Aging changes Mental state: Alert, oriented, cooperative    Lab Results: BMET    Component Value Date/Time   NA 140 06/25/2015 0723   NA 143 01/02/2014 1311   NA 141 02/26/2012 0852   K 3.7 06/25/2015 0723   K 3.4* 02/26/2012 0852   CL 99* 06/25/2015 0723   CL 105 02/26/2012 0852   CO2 28 06/25/2015 0723   CO2 28 02/26/2012 0852   GLUCOSE 94 06/25/2015 0723   GLUCOSE 112* 01/02/2014 1311   GLUCOSE 77 02/26/2012 0852   BUN 10 06/25/2015 0723   BUN 11 01/02/2014 1311   BUN 13.0 02/26/2012 0852   CREATININE 1.44* 06/25/2015 0723   CREATININE 1.33 05/11/2013 1246   CREATININE 1.3 02/26/2012 0852   CALCIUM 9.1 06/25/2015 0723   CALCIUM 9.1 02/26/2012 0852   CALCIUM 8.9 08/04/2010 0836   GFRNONAA 45* 06/25/2015 0723   GFRAA 53* 06/25/2015 0723   CBC    Component Value Date/Time   WBC 6.6 06/25/2015 0723   WBC 9.8 11/21/2012 1008   RBC 3.95* 06/25/2015 0723   RBC 3.95* 11/21/2012 1008   HGB 12.3* 06/25/2015 0723   HGB 12.4*  11/21/2012 1008   HCT 37.3* 06/25/2015 0723   HCT 35.8* 11/21/2012 1008   PLT 220 06/25/2015 0723   PLT 231 11/21/2012 1008   MCV 94.4 06/25/2015 0723   MCV 90.7 11/21/2012 1008   MCH 31.1 06/25/2015 0723   MCH 31.3 11/21/2012 1008   MCHC 33.0 06/25/2015 0723   MCHC 34.5 11/21/2012 1008   RDW 14.3 06/25/2015 0723   RDW 13.7 11/21/2012 1008   LYMPHSABS 1.5 06/25/2015 0723   LYMPHSABS 1.1 11/21/2012 1008   MONOABS 0.9 06/25/2015 0723   MONOABS 0.6 11/21/2012 1008   EOSABS 0.3 06/25/2015 0723   EOSABS 0.1 11/21/2012 1008   BASOSABS 0.0 06/25/2015 0723   BASOSABS 0.0 11/21/2012 1008    Studies/Results: No results found.  Medications: I have reviewed the patient's current medications.  Assessment/Plan:  1. 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 Left lower limb ataxia and trunkal ataxia  2. H/o PE/Anticoagulation: Pharmaceutical: Other (comment)--coumadin changed to Eliquis.  3. Cervical spondylosis/Achilles tenditinits/Pain Management: Was getting outpatient PT to help with pain and right ankle pain. Tylenol prn 4. Mood: LCSW to monitor for evaluation and support. Pt concerned about his wife with "bad knee" 5. Neuropsych: This patient is capable of making decisions on his own behalf. 6. Skin/Wound Care: Routine pressure relief measures 7.  Fluids/Electrolytes/Nutrition: Monitor I/O. Check lytes in am. Encourage fluid intake to avoid dehydration.  8. CAD with chronic diastolic CHF-compensated: Monitor for signs of overload. Check weight daily. Low salt diet. Symptoms managed on Imdur, Lasix, metoprolol and Crestor.  9. 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 T2DM: Hgb A1C-5.7--well controlled on diet. Will check BS BS ac/hs and use SSI for elevated BS--fairly stable can reduce cbg checks to daily . 12. GERD with nausea and postprandial fullness: Symptoms  managed at this time on continue Nexium BID.  13. CKD?: Creatinine at -1.5 earlier this year and metformin d/c.  14. HTN: Monitor with increased activity. Allow for permissive HTN , BPs running on low side, reduced prinivil dose to 5mg  on 5/18, BPs im better range ,also on toprol XL 25mg  daily        D/c today   Length of stay, days: 5  [name] , MD 06/29/2015, 7:51 AM

## 2015-07-01 ENCOUNTER — Telehealth: Payer: Self-pay | Admitting: Endocrinology

## 2015-07-01 DIAGNOSIS — R269 Unspecified abnormalities of gait and mobility: Secondary | ICD-10-CM | POA: Diagnosis not present

## 2015-07-01 DIAGNOSIS — E1143 Type 2 diabetes mellitus with diabetic autonomic (poly)neuropathy: Secondary | ICD-10-CM | POA: Diagnosis not present

## 2015-07-01 DIAGNOSIS — M4302 Spondylolysis, cervical region: Secondary | ICD-10-CM | POA: Diagnosis not present

## 2015-07-01 DIAGNOSIS — R42 Dizziness and giddiness: Secondary | ICD-10-CM | POA: Diagnosis not present

## 2015-07-01 DIAGNOSIS — M4806 Spinal stenosis, lumbar region: Secondary | ICD-10-CM | POA: Diagnosis not present

## 2015-07-01 DIAGNOSIS — I69398 Other sequelae of cerebral infarction: Secondary | ICD-10-CM | POA: Diagnosis not present

## 2015-07-01 NOTE — Telephone Encounter (Signed)
See note below

## 2015-07-01 NOTE — Telephone Encounter (Signed)
ok 

## 2015-07-01 NOTE — Telephone Encounter (Signed)
Caryl Pina from [facility] called stated patient will be starting his services with PT today. [phone]

## 2015-07-02 ENCOUNTER — Telehealth: Payer: Self-pay

## 2015-07-02 NOTE — Telephone Encounter (Signed)
1. Are you/is patient experiencing any problems since coming home? Are there any questions regarding any aspect of care? Pt is experiencing some anxiety since being home.  2. Are there any questions regarding medications administration/dosing? Are meds being taken as prescribed? Patient should review meds with caller to confirm. Meds confirmed.  3. Have there been any falls? No falls.  4. Has Home Health been to the house and/or have they contacted you? If not, have you tried to contact them? Can we help you contact them? PT was there yesterday.  OT and RN have not contacted pt.  5. Are bowels and bladder emptying properly? Are there any unexpected incontinence issues? If applicable, is patient following bowel/bladder programs? No issues 6. Any fevers, problems with breathing, unexpected pain? No 7. Are there any skin problems or new areas of breakdown? No 8. Has the patient/family member arranged specialty MD follow up (ie cardiology/neurology/renal/surgical/etc)?  Can we help arrange? Appointment on June 1st with PCP. Waiting on call from Dr. Jannifer Franklin to set up an appt.  9. Does the patient need any other services or support that we can help arrange? No 10. Are caregivers following through as expected in assisting the patient? Yes, wife. 11. Has the patient quit smoking, drinking alcohol, or using drugs as recommended? Pt is not smoking, drinking alcohol or using drugs.   Pt is aware of appointment on 07/12/15 @ 10:00am.  Pt packet mailed.

## 2015-07-03 ENCOUNTER — Telehealth: Payer: Self-pay | Admitting: *Deleted

## 2015-07-03 DIAGNOSIS — M4302 Spondylolysis, cervical region: Secondary | ICD-10-CM | POA: Diagnosis not present

## 2015-07-03 DIAGNOSIS — R269 Unspecified abnormalities of gait and mobility: Secondary | ICD-10-CM | POA: Diagnosis not present

## 2015-07-03 DIAGNOSIS — I69398 Other sequelae of cerebral infarction: Secondary | ICD-10-CM | POA: Diagnosis not present

## 2015-07-03 DIAGNOSIS — E1143 Type 2 diabetes mellitus with diabetic autonomic (poly)neuropathy: Secondary | ICD-10-CM | POA: Diagnosis not present

## 2015-07-03 DIAGNOSIS — R42 Dizziness and giddiness: Secondary | ICD-10-CM | POA: Diagnosis not present

## 2015-07-03 DIAGNOSIS — M4806 Spinal stenosis, lumbar region: Secondary | ICD-10-CM | POA: Diagnosis not present

## 2015-07-03 NOTE — Telephone Encounter (Signed)
[name], PT, with [facility] called asking for verbal orders to continue homehealth physical therapy 2 week 4, verbal orders given per office protocol

## 2015-07-09 DIAGNOSIS — E1143 Type 2 diabetes mellitus with diabetic autonomic (poly)neuropathy: Secondary | ICD-10-CM | POA: Diagnosis not present

## 2015-07-09 DIAGNOSIS — I69398 Other sequelae of cerebral infarction: Secondary | ICD-10-CM | POA: Diagnosis not present

## 2015-07-09 DIAGNOSIS — M4302 Spondylolysis, cervical region: Secondary | ICD-10-CM | POA: Diagnosis not present

## 2015-07-09 DIAGNOSIS — M4806 Spinal stenosis, lumbar region: Secondary | ICD-10-CM | POA: Diagnosis not present

## 2015-07-09 DIAGNOSIS — R42 Dizziness and giddiness: Secondary | ICD-10-CM | POA: Diagnosis not present

## 2015-07-09 DIAGNOSIS — R269 Unspecified abnormalities of gait and mobility: Secondary | ICD-10-CM | POA: Diagnosis not present

## 2015-07-10 DIAGNOSIS — R269 Unspecified abnormalities of gait and mobility: Secondary | ICD-10-CM | POA: Diagnosis not present

## 2015-07-10 DIAGNOSIS — R42 Dizziness and giddiness: Secondary | ICD-10-CM | POA: Diagnosis not present

## 2015-07-10 DIAGNOSIS — M4302 Spondylolysis, cervical region: Secondary | ICD-10-CM | POA: Diagnosis not present

## 2015-07-10 DIAGNOSIS — E1143 Type 2 diabetes mellitus with diabetic autonomic (poly)neuropathy: Secondary | ICD-10-CM | POA: Diagnosis not present

## 2015-07-10 DIAGNOSIS — M4806 Spinal stenosis, lumbar region: Secondary | ICD-10-CM | POA: Diagnosis not present

## 2015-07-10 DIAGNOSIS — I69398 Other sequelae of cerebral infarction: Secondary | ICD-10-CM | POA: Diagnosis not present

## 2015-07-11 ENCOUNTER — Ambulatory Visit (INDEPENDENT_AMBULATORY_CARE_PROVIDER_SITE_OTHER): Payer: Medicare Other | Admitting: Endocrinology

## 2015-07-11 ENCOUNTER — Encounter: Payer: Self-pay | Admitting: Endocrinology

## 2015-07-11 VITALS — BP 118/80 | HR 61 | Temp 98.0°F | Ht 65.0 in | Wt 181.0 lb

## 2015-07-11 DIAGNOSIS — I639 Cerebral infarction, unspecified: Secondary | ICD-10-CM

## 2015-07-11 DIAGNOSIS — I10 Essential (primary) hypertension: Secondary | ICD-10-CM | POA: Diagnosis not present

## 2015-07-11 MED ORDER — METOPROLOL SUCCINATE ER 25 MG PO TB24: 12.5000 mg | ORAL_TABLET | Freq: Every day | ORAL | Status: DC

## 2015-07-11 MED ORDER — LORAZEPAM 0.5 MG PO TABS: 0.5000 mg | ORAL_TABLET | Freq: Every day | ORAL | Status: DC

## 2015-07-11 NOTE — Patient Instructions (Addendum)
Here is a refill of the ativan.  This can increase your chances for a fall, so try to minimize this if you can. it is critically important to prevent falling down (keep floor areas well-lit, dry, and free of loose objects.  If you have a cane, walker, or wheelchair, you should use it, even for short trips around the house.  Wear flat-soled shoes.  Also, try not to rush) Please come back for a follow-up appointment in 3 months.  We'll do the reclast when you are ready.  Please reduce the metoprolol to 1/2 pill per day.

## 2015-07-11 NOTE — Progress Notes (Signed)
Subjective:    Patient ID: [name], male    DOB: 02/10/1940, 75 y.o.   MRN: [id number]  HPI Pt was hospitalized for vertebral CVA 2 weeks ago.  He says dizziness sensation in the head is 95% better--now mild.  No assoc n/v.   Past Medical History  Diagnosis Date  . Coronary atherosclerosis of unspecified type of vessel, native or graft     a. cath in 2003 showed 10-20% LM, scattered 20% prox-mid LAD, 30% more distal LAD, 50-60% stenosis of LAD towards apex, 20% Cx, 30% dRCA, focal 95% stenosis in smaller of 2 branches of PDA, EF 60-65%.  . Mixed hyperlipidemia   . Glucose intolerance (impaired glucose tolerance)     history of  . Diverticulosis of colon (without mention of hemorrhage)   . Hx of colonic polyps   . Internal hemorrhoids without mention of complication   . Rash and other nonspecific skin eruption   . Conjunctivitis unspecified   . Myasthenia gravis ([place])     uses prednisone  . GERD (gastroesophageal reflux disease)   . Pulmonary embolism [facility]) June 2013    a. Bilateral PE 07/2011.  Marland Kitchen DVT (deep venous thrombosis) [facility]) June 2013  . Chronic anticoagulation   . Diabetes mellitus ([place])   . DJD (degenerative joint disease)   . Ptosis   . CML (chronic myeloid leukemia) ([place])   . Essential hypertension   . IBS (irritable bowel syndrome)   . History of pneumonia   . Dystonia   . 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 PONV (postoperative nausea and vomiting)   . External hemorrhoids without mention of complication   . History of kidney stones   . Tubulovillous adenoma polyp of colon     2010  . HOH (hard of hearing)     hearing aids  . Osteoporosis 2016  . Gastroparesis   . Esophageal stricture   . Chronic edema   . Carotid artery disease ([place])     a. Duplex 05/2014: 123456 RICA, 123456 LICA, elevated velocities in right subclavian artery, normal left subclavian artery..    Past Surgical History  Procedure Laterality Date  . Angioplasty  2001  .  Back surgery      x2, lumbar and cervical  . Tonsillectomy    . Cataract extraction Bilateral 12/12  . Shoulder surgery Left 05/07/2009  . Cardiac catheterization    . Lumbar laminectomy/decompression microdiscectomy Right 09/14/2012    Procedure: Right Lumbar three-four, four-five, Lumbar five-Sacral one decompressive laminectomy;  Surgeon: [name], MD;  Location: [place] [facility];  Service: Neurosurgery;  Laterality: Right;  . Esophagogastroduodenoscopy endoscopy  04/2015    Social History   Social History  . Marital Status: Married    Spouse Name: Hassan Rowan  . Number of Children: 2  . Years of Education: 1-College   Occupational History  . retired     Retired   Social History Main Topics  . Smoking status: Never Smoker   . Smokeless tobacco: Never Used  . Alcohol Use: No  . Drug Use: No  . Sexual Activity: Not Currently   Other Topics Concern  . Not on file   Social History Narrative   Patient is right handed.   Patient has 1-2 cups of caffeine daily.    Current Outpatient Prescriptions on File Prior to Visit  Medication Sig Dispense Refill  . apixaban (ELIQUIS) 5 MG TABS tablet Take 1 tablet (5 mg total) by mouth 2 (two)  times daily. 60 tablet 1  . brimonidine-timolol (COMBIGAN) 0.2-0.5 % ophthalmic solution Place 1 drop into both eyes every 12 (twelve) hours.    Marland Kitchen esomeprazole (NEXIUM) 40 MG capsule Take 1 capsule (40 mg total) by mouth 2 (two) times daily. (Patient taking differently: Take 40 mg by mouth every morning. ) 60 capsule 6  . furosemide (LASIX) 20 MG tablet Take 20 mg by mouth daily.     Marland Kitchen imatinib (GLEEVEC) 100 MG tablet Take 2 tablets (200 mg total) by mouth 2 (two) times daily with a meal. Take with meals and large glass of water.Caution:Chemotherapy.    . isosorbide mononitrate (IMDUR) 30 MG 24 hr tablet TAKE 1 TABLET EVERY DAY 90 tablet 3  . lisinopril (PRINIVIL,ZESTRIL) 5 MG tablet Take 1 tablet (5 mg total) by mouth daily. 30 tablet 0  . loperamide  (IMODIUM) 2 MG capsule Take 2 mg by mouth every morning. For diarrhea    . Multiple Vitamin (MULTIVITAMIN) tablet Take 1 tablet by mouth daily.    . nitroGLYCERIN (NITROSTAT) 0.4 MG SL tablet Place 1 tablet (0.4 mg total) under the tongue every 5 (five) minutes x 3 doses as needed for chest pain. 90 tablet 3  . predniSONE (DELTASONE) 5 MG tablet Take 1 tablet (5 mg total) by mouth daily. 90 tablet 1  . promethazine (PHENERGAN) 12.5 MG tablet Take 1 tablet (12.5 mg total) by mouth every 6 (six) hours as needed for nausea or vomiting. 30 tablet 0  . pyridostigmine (MESTINON) 60 MG tablet Take 1 tablet (60 mg total) by mouth 3 (three) times daily. (Patient taking differently: Take 60 mg by mouth 2 (two) times daily. ) 90 tablet 5  . rosuvastatin (CRESTOR) 10 MG tablet TAKE 1/2 TABLETS BY MOUTH DAILY. 45 tablet 0  . [DISCONTINUED] zolpidem (AMBIEN) 10 MG tablet Take 10 mg by mouth at bedtime as needed.      No current facility-administered medications on file prior to visit.    Allergies  Allergen Reactions  . Oxycodone-Acetaminophen Other (See Comments)    "too strong"  . Phenergan [Promethazine Hcl]     Eye swelling when combined with Zofran? Has taken it in the past without side effects  . Zofran [Ondansetron Hcl]     Reaction when given with phenergan?  . Sulfamethoxazole Rash and Itching    Family History  Problem Relation Age of Onset  . Heart disease Mother   . Heart attack Mother   . Stroke Mother   . Heart disease Father   . Heart attack Father   . Stroke Father   . Breast cancer Sister     Twin   . Heart attack Sister   . Dementia Sister   . Heart disease Sister   . Heart attack Sister   . Clotting disorder Sister   . Heart attack Sister   . Colon cancer Neg Hx     BP 118/80 mmHg  Pulse 61  Temp(Src) 98 F (36.7 C) (Oral)  Ht 5\' 5"  (1.651 m)  Wt 181 lb (82.101 kg)  BMI 30.12 kg/m2  SpO2 97%    Review of Systems Denies LOC and falls.     Objective:    Physical Exam  VITAL SIGNS:  See vs page GENERAL: no distress Gait is steady with a cane.        Assessment & Plan:  CVA, clinically better HTN, with a component of reflex HTN after CVA, now overcontrolled Bradycardia, new: due to b-blocker.  Patient  is advised the following: Patient Instructions  Here is a refill of the ativan.  This can increase your chances for a fall, so try to minimize this if you can. it is critically important to prevent falling down (keep floor areas well-lit, dry, and free of loose objects.  If you have a cane, walker, or wheelchair, you should use it, even for short trips around the house.  Wear flat-soled shoes.  Also, try not to rush) Please come back for a follow-up appointment in 3 months.  We'll do the reclast when you are ready.  Please reduce the metoprolol to 1/2 pill per day.    Renato Shin, MD

## 2015-07-12 ENCOUNTER — Encounter: Payer: Medicare Other | Attending: Physical Medicine & Rehabilitation

## 2015-07-12 ENCOUNTER — Encounter: Payer: Self-pay | Admitting: Physical Medicine & Rehabilitation

## 2015-07-12 ENCOUNTER — Ambulatory Visit (HOSPITAL_BASED_OUTPATIENT_CLINIC_OR_DEPARTMENT_OTHER): Payer: Medicare Other | Admitting: Physical Medicine & Rehabilitation

## 2015-07-12 VITALS — BP 110/57 | HR 54 | Resp 14

## 2015-07-12 DIAGNOSIS — C9210 Chronic myeloid leukemia, BCR/ABL-positive, not having achieved remission: Secondary | ICD-10-CM | POA: Diagnosis not present

## 2015-07-12 DIAGNOSIS — I130 Hypertensive heart and chronic kidney disease with heart failure and stage 1 through stage 4 chronic kidney disease, or unspecified chronic kidney disease: Secondary | ICD-10-CM | POA: Diagnosis not present

## 2015-07-12 DIAGNOSIS — I639 Cerebral infarction, unspecified: Secondary | ICD-10-CM | POA: Diagnosis not present

## 2015-07-12 DIAGNOSIS — N189 Chronic kidney disease, unspecified: Secondary | ICD-10-CM | POA: Diagnosis not present

## 2015-07-12 DIAGNOSIS — K222 Esophageal obstruction: Secondary | ICD-10-CM | POA: Insufficient documentation

## 2015-07-12 DIAGNOSIS — Z86718 Personal history of other venous thrombosis and embolism: Secondary | ICD-10-CM | POA: Diagnosis not present

## 2015-07-12 DIAGNOSIS — G7000 Myasthenia gravis without (acute) exacerbation: Secondary | ICD-10-CM | POA: Insufficient documentation

## 2015-07-12 DIAGNOSIS — Z86010 Personal history of colonic polyps: Secondary | ICD-10-CM | POA: Insufficient documentation

## 2015-07-12 DIAGNOSIS — N289 Disorder of kidney and ureter, unspecified: Secondary | ICD-10-CM | POA: Diagnosis present

## 2015-07-12 DIAGNOSIS — I5032 Chronic diastolic (congestive) heart failure: Secondary | ICD-10-CM | POA: Insufficient documentation

## 2015-07-12 DIAGNOSIS — E782 Mixed hyperlipidemia: Secondary | ICD-10-CM | POA: Diagnosis not present

## 2015-07-12 DIAGNOSIS — E1122 Type 2 diabetes mellitus with diabetic chronic kidney disease: Secondary | ICD-10-CM | POA: Insufficient documentation

## 2015-07-12 DIAGNOSIS — R269 Unspecified abnormalities of gait and mobility: Secondary | ICD-10-CM | POA: Insufficient documentation

## 2015-07-12 DIAGNOSIS — Z86711 Personal history of pulmonary embolism: Secondary | ICD-10-CM | POA: Diagnosis not present

## 2015-07-12 DIAGNOSIS — Z87442 Personal history of urinary calculi: Secondary | ICD-10-CM | POA: Diagnosis not present

## 2015-07-12 DIAGNOSIS — I69398 Other sequelae of cerebral infarction: Secondary | ICD-10-CM | POA: Diagnosis not present

## 2015-07-12 NOTE — Progress Notes (Signed)
Subjective:    Patient ID: [name], male    DOB: [birth year], 75 y.o.   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 He presented to [facility] ED 5/10 for evaluation. INR at admission 2 and MRI brain done revealing acute stroke involving inferior left cerebellum. CTA head and neck showed acute/early subacute left cerebellar infarct, proximal left V2 vertebral artery occlusion with intermittent distal reconstitution and patent dominant R-VA. 2 Neurology felt that stroke etiology could be cardioembolic due to MAT on exam v/s large vessel disease due to chronic L-VA occlusion with intermittent reconstitution and Coumadin was changed to Eliquis. Patient with resultant dizziness due to gaze instability, nausea and BLE weakness with instability Admit date: 06/24/2015 Discharge date: 06/29/2015  HPI Patient has return to home, he recalls his medical history quite well. He is living with his wife and receiving home health PT and OT. The OT was recently discontinued because of his independence with ADLs. No significant upper limb issues. Continues to use a cane to ambulate. Prior to his stroke he was ambulating several miles a day for exercise. He uses a fitness tracker and has been averaging about 2000 steps per day. Prior to his stroke around 10,000 steps per day  He is goals are return to driving. Pain Inventory Average Pain NA Pain Right Now NA My pain is NA  In the last 24 hours, has pain interfered with the following? General activity 0 Relation with others 0 Enjoyment of life 0 What TIME of day is your pain at its worst? NA Sleep (in general) Fair  Pain is worse with: NA Pain improves with: NA Relief from Meds: NA  Mobility ability to  climb steps?  yes do you drive?  no  Function retired  Neuro/Psych weakness  Prior Studies Any changes since last visit?  no  Physicians involved in your care Primary care Sam Loanne Drilling Neurologist [place]   Family History  Problem Relation Age of Onset  . Heart disease Mother   . Heart attack Mother   . Stroke Mother   . Heart disease Father   . Heart attack Father   . Stroke Father   . Breast cancer Sister     Twin   . Heart attack Sister   . Dementia Sister   . Heart disease Sister   . Heart attack Sister   . Clotting disorder Sister   . Heart attack Sister   . Colon cancer Neg Hx    Social History   Social History  . Marital Status: Married    Spouse Name: Hassan Rowan  . Number of Children: 2  . Years of Education: 1-College   Occupational History  . retired     Retired   Social History Main Topics  . Smoking status: Never Smoker   . Smokeless tobacco: Never Used  . Alcohol Use: No  . Drug Use: No  . Sexual Activity: Not Currently   Other Topics Concern  . None   Social History Narrative   Patient is right handed.   Patient has 1-2 cups of caffeine daily.   Past Surgical History  Procedure Laterality Date  . Angioplasty  2001  . Back surgery      x2, lumbar and cervical  . Tonsillectomy    .  Cataract extraction Bilateral 12/12  . Shoulder surgery Left 05/07/2009  . Cardiac catheterization    . Lumbar laminectomy/decompression microdiscectomy Right 09/14/2012    Procedure: Right Lumbar three-four, four-five, Lumbar five-Sacral one decompressive laminectomy;  Surgeon: [name], MD;  Location: [facility];  Service: Neurosurgery;  Laterality: Right;  . Esophagogastroduodenoscopy endoscopy  04/2015   Past Medical History  Diagnosis Date  . Coronary atherosclerosis of unspecified type of vessel, native or graft     a. cath in 2003 showed 10-20% LM, scattered 20% prox-mid LAD, 30% more distal LAD, 50-60% stenosis of LAD towards apex, 20% Cx, 30%  dRCA, focal 95% stenosis in smaller of 2 branches of PDA, EF 60-65%.  . Mixed hyperlipidemia   . Glucose intolerance (impaired glucose tolerance)     history of  . Diverticulosis of colon (without mention of hemorrhage)   . Hx of colonic polyps   . Internal hemorrhoids without mention of complication   . Rash and other nonspecific skin eruption   . Conjunctivitis unspecified   . Myasthenia gravis ([place])     uses prednisone  . GERD (gastroesophageal reflux disease)   . Pulmonary embolism [facility]) June 2013    a. Bilateral PE 07/2011.  Marland Kitchen DVT (deep venous thrombosis) [facility]) June 2013  . Chronic anticoagulation   . Diabetes mellitus ([place])   . DJD (degenerative joint disease)   . Ptosis   . CML (chronic myeloid leukemia) ([place])   . Essential hypertension   . IBS (irritable bowel syndrome)   . History of pneumonia   . Dystonia   . 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 PONV (postoperative nausea and vomiting)   . External hemorrhoids without mention of complication   . History of kidney stones   . Tubulovillous adenoma polyp of colon     2010  . HOH (hard of hearing)     hearing aids  . Osteoporosis 2016  . Gastroparesis   . Esophageal stricture   . Chronic edema   . Carotid artery disease ([place])     a. Duplex 05/2014: 123456 RICA, 123456 LICA, elevated velocities in right subclavian artery, normal left subclavian artery..   BP 110/57 mmHg  Pulse 54  Resp 14  SpO2 97%  Opioid Risk Score:   Fall Risk Score:  `1  Depression screen PHQ 2/9  Depression screen PHQ 2/9 07/12/2015  Decreased Interest 0  Down, Depressed, Hopeless 0  PHQ - 2 Score 0  Altered sleeping 1  Tired, decreased energy 1  Change in appetite 0  Feeling bad or failure about yourself  0  Trouble concentrating 0  Moving slowly or fidgety/restless 0  Suicidal thoughts 0  PHQ-9 Score 2  Difficult doing work/chores Not difficult at all     Review of Systems  Neurological: Positive for weakness.  All  other systems reviewed and are negative.      Objective:   Physical Exam  Constitutional: He is oriented to person, place, and time. He appears well-developed and well-nourished.  HENT:  Head: Normocephalic and atraumatic.  Eyes: Conjunctivae and EOM are normal. Pupils are equal, round, and reactive to light.  Neck: Normal range of motion.  Cardiovascular: Normal rate, regular rhythm and normal heart sounds.   Pulmonary/Chest: Effort normal and breath sounds normal.  Abdominal: Soft. Bowel sounds are normal. He exhibits no distension. There is no tenderness.  Musculoskeletal: Normal range of motion. He exhibits no tenderness.  Neurological: He is alert and oriented to person,  place, and time. He displays no tremor. He exhibits normal muscle tone. Coordination normal.  No evidence of dysmetria on finger-nose-finger testing  Motor strength is 5/5 bilateral deltoid, biceps, triceps, grip, hip flexor, knee extensor, ankle dorsiflexor and plantar flexor  Sensation intact to light touch in bilateral upper and lower limbs  Skin: Skin is warm and dry.  Psychiatric: He has a normal mood and affect. His behavior is normal.  Nursing note and vitals reviewed.         Assessment & Plan:   Medical Problem List and Plan: 1.  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   Still using a cane, receiving home health PT. Anticipate that he will finish up with this in the near future. If he is still ambulating with a cane after completing home health therapy, would recommend outpatient therapy.  Graduated return to driving instructions were provided. It is recommended that the patient first drives with another licensed driver in an empty parking lot. If the patient does well with this, and they can drive on a quiet street with the licensed driver. If the patient does well with this they can drive on a busy street with a licensed driver. If the  patient does well with this, the next time out they can go by himself. For the first month after resuming driving, I recommend no nighttime or Interstate driving.   Physical medicine and rehabilitation follow-up on an as-needed basis.  Follow-up with his neurologist Dr. Jannifer Franklin on 09/05/2015  2.  H/o PE/Anticoagulation: Pharmaceutical: Other (comment)--coumadin changed to Eliquis.  3. Cervical spondylosis/Achilles tenditinits/Pain Management: no severe pain  4.  CAD with chronic diastolic CHF-compensated: Monitor for signs of overload. Check weight daily. Low salt diet. Symptoms managed on Imdur, Lasix, metoprolol and Crestor. Dr Burt Knack at [facility]  5. Myasthenia Gravis: Primarily ocular features--On prednisone and Pyridostigmine F/u Dr Jannifer Franklin 6 CML: On Gleevec, F/u Dr Benay Spice, Dr Shaune Spittle at [facility] 7. T2DM: Hgb A1C-5.7--well controlled on diet. F/u Dr Loanne Drilling who is also PCP 8. GERD with nausea and postprandial fullness:  Symptoms managed at this time on continue Nexium BID.  9. CKD?: Creatinine at -1.5 earlier this year and metformin d/c.   10. [id number] Lasix and Toprol, follow up with Dr. Loanne Drilling

## 2015-07-12 NOTE — Patient Instructions (Signed)

## 2015-07-16 DIAGNOSIS — E1143 Type 2 diabetes mellitus with diabetic autonomic (poly)neuropathy: Secondary | ICD-10-CM | POA: Diagnosis not present

## 2015-07-16 DIAGNOSIS — M4806 Spinal stenosis, lumbar region: Secondary | ICD-10-CM | POA: Diagnosis not present

## 2015-07-16 DIAGNOSIS — R269 Unspecified abnormalities of gait and mobility: Secondary | ICD-10-CM | POA: Diagnosis not present

## 2015-07-16 DIAGNOSIS — I69398 Other sequelae of cerebral infarction: Secondary | ICD-10-CM | POA: Diagnosis not present

## 2015-07-16 DIAGNOSIS — M4302 Spondylolysis, cervical region: Secondary | ICD-10-CM | POA: Diagnosis not present

## 2015-07-16 DIAGNOSIS — R42 Dizziness and giddiness: Secondary | ICD-10-CM | POA: Diagnosis not present

## 2015-07-22 DIAGNOSIS — L570 Actinic keratosis: Secondary | ICD-10-CM | POA: Diagnosis not present

## 2015-07-22 DIAGNOSIS — L820 Inflamed seborrheic keratosis: Secondary | ICD-10-CM | POA: Diagnosis not present

## 2015-07-25 DIAGNOSIS — E1143 Type 2 diabetes mellitus with diabetic autonomic (poly)neuropathy: Secondary | ICD-10-CM | POA: Diagnosis not present

## 2015-07-25 DIAGNOSIS — I69398 Other sequelae of cerebral infarction: Secondary | ICD-10-CM | POA: Diagnosis not present

## 2015-07-25 DIAGNOSIS — R269 Unspecified abnormalities of gait and mobility: Secondary | ICD-10-CM | POA: Diagnosis not present

## 2015-07-25 DIAGNOSIS — M4302 Spondylolysis, cervical region: Secondary | ICD-10-CM | POA: Diagnosis not present

## 2015-07-25 DIAGNOSIS — M4806 Spinal stenosis, lumbar region: Secondary | ICD-10-CM | POA: Diagnosis not present

## 2015-07-25 DIAGNOSIS — R42 Dizziness and giddiness: Secondary | ICD-10-CM | POA: Diagnosis not present

## 2015-07-26 ENCOUNTER — Other Ambulatory Visit: Payer: Self-pay | Admitting: Neurology

## 2015-07-26 ENCOUNTER — Other Ambulatory Visit: Payer: Self-pay | Admitting: Cardiovascular Disease

## 2015-07-26 DIAGNOSIS — K118 Other diseases of salivary glands: Secondary | ICD-10-CM | POA: Diagnosis not present

## 2015-07-26 DIAGNOSIS — Z79899 Other long term (current) drug therapy: Secondary | ICD-10-CM | POA: Diagnosis not present

## 2015-07-26 DIAGNOSIS — Z9889 Other specified postprocedural states: Secondary | ICD-10-CM | POA: Diagnosis not present

## 2015-07-26 DIAGNOSIS — Z872 Personal history of diseases of the skin and subcutaneous tissue: Secondary | ICD-10-CM | POA: Diagnosis not present

## 2015-07-26 DIAGNOSIS — D720 Genetic anomalies of leukocytes: Secondary | ICD-10-CM | POA: Diagnosis not present

## 2015-07-26 DIAGNOSIS — G7000 Myasthenia gravis without (acute) exacerbation: Secondary | ICD-10-CM | POA: Diagnosis not present

## 2015-07-26 DIAGNOSIS — D539 Nutritional anemia, unspecified: Secondary | ICD-10-CM | POA: Diagnosis not present

## 2015-07-26 DIAGNOSIS — Z7952 Long term (current) use of systemic steroids: Secondary | ICD-10-CM | POA: Diagnosis not present

## 2015-07-26 DIAGNOSIS — M25559 Pain in unspecified hip: Secondary | ICD-10-CM | POA: Diagnosis not present

## 2015-07-26 DIAGNOSIS — M810 Age-related osteoporosis without current pathological fracture: Secondary | ICD-10-CM | POA: Diagnosis not present

## 2015-07-26 DIAGNOSIS — I639 Cerebral infarction, unspecified: Secondary | ICD-10-CM | POA: Diagnosis not present

## 2015-07-26 DIAGNOSIS — Z86711 Personal history of pulmonary embolism: Secondary | ICD-10-CM | POA: Diagnosis not present

## 2015-07-26 DIAGNOSIS — Z7901 Long term (current) use of anticoagulants: Secondary | ICD-10-CM | POA: Diagnosis not present

## 2015-07-26 DIAGNOSIS — C9211 Chronic myeloid leukemia, BCR/ABL-positive, in remission: Secondary | ICD-10-CM | POA: Diagnosis not present

## 2015-07-26 DIAGNOSIS — K222 Esophageal obstruction: Secondary | ICD-10-CM | POA: Diagnosis not present

## 2015-07-29 ENCOUNTER — Other Ambulatory Visit: Payer: Self-pay | Admitting: *Deleted

## 2015-07-29 MED ORDER — APIXABAN 5 MG PO TABS: 5.0000 mg | ORAL_TABLET | Freq: Two times a day (BID) | ORAL | Status: DC

## 2015-07-29 NOTE — Telephone Encounter (Signed)
Looks like this was prescribed by neurology. Should it be deferred back to the neurologist or will Dr Burt Knack refill? Please advise. Thanks, MI

## 2015-07-29 NOTE — Telephone Encounter (Signed)
The pt was changed from Warfarin to Eliquis by Neurology due to stroke. This should be prescribed by neurology.  Thank you

## 2015-07-29 NOTE — Telephone Encounter (Signed)
Patient calling to see who will refill his eliquis. He was on Warfarin but was switched to Eliquis and thought it was to be refilled through cardiology. In a refill note in Epic it states patient is to have Neurology refill since they changed his medication due to recent stroke. I have sent refil message to Dr. Kathrynn Ducking, the pt's neurologist, for further review.

## 2015-07-30 DIAGNOSIS — K119 Disease of salivary gland, unspecified: Secondary | ICD-10-CM | POA: Diagnosis not present

## 2015-08-20 ENCOUNTER — Telehealth: Payer: Self-pay | Admitting: Cardiovascular Disease

## 2015-08-20 NOTE — Telephone Encounter (Signed)
New Message  Pt wife call requesting to speak with only Lauren. Pt wife states pt had a stroke and needs pt to be seen by Dr. Burt Knack. Pt wife refused a APP. Pt wife did not want to disclose more information. I did inform pt RN was out for the day. Pt understood and still wanted message to go only RN. Please call back to advise

## 2015-08-22 DIAGNOSIS — H40043 Steroid responder, bilateral: Secondary | ICD-10-CM | POA: Diagnosis not present

## 2015-08-22 DIAGNOSIS — H401131 Primary open-angle glaucoma, bilateral, mild stage: Secondary | ICD-10-CM | POA: Diagnosis not present

## 2015-08-26 ENCOUNTER — Telehealth: Payer: Self-pay | Admitting: Cardiovascular Disease

## 2015-08-26 NOTE — Telephone Encounter (Signed)
I was out of the office until 08/26/15. Please see follow-up telephone message from today.

## 2015-08-26 NOTE — Telephone Encounter (Signed)
I spoke with the pt's wife and made her aware that the pt was due for yearly follow-up in May.  The pt was hospitalized during that time due to stroke. I made her aware that the pt has had an echo and carotid performed this year. I will place the pt on Dr Antionette Char wait list and contact the pt's wife with an appointment when Dr Burt Knack has an opening. Pt's wife agreed with plan.

## 2015-08-26 NOTE — Telephone Encounter (Signed)
New message     The wife is calling the pt has a stroke the Md believes the clot came from the heart, the wife feels the pt needs to see Dr. Burt Knack soon and the wife wants to know from a nurses standpoint what she needs to do. For a ASAP appointment.

## 2015-08-27 NOTE — Telephone Encounter (Signed)
I spoke with the pt's wife and appointment scheduled on 09/12/15 with Dr Burt Knack.

## 2015-08-29 DIAGNOSIS — L570 Actinic keratosis: Secondary | ICD-10-CM | POA: Diagnosis not present

## 2015-08-29 DIAGNOSIS — L821 Other seborrheic keratosis: Secondary | ICD-10-CM | POA: Diagnosis not present

## 2015-09-02 ENCOUNTER — Ambulatory Visit (INDEPENDENT_AMBULATORY_CARE_PROVIDER_SITE_OTHER): Payer: Medicare Other | Admitting: Neurology

## 2015-09-02 ENCOUNTER — Encounter: Payer: Self-pay | Admitting: Neurology

## 2015-09-02 VITALS — BP 126/70 | HR 51 | Ht 66.0 in | Wt 178.0 lb

## 2015-09-02 DIAGNOSIS — I63542 Cerebral infarction due to unspecified occlusion or stenosis of left cerebellar artery: Secondary | ICD-10-CM

## 2015-09-02 DIAGNOSIS — R269 Unspecified abnormalities of gait and mobility: Secondary | ICD-10-CM

## 2015-09-02 DIAGNOSIS — G7000 Myasthenia gravis without (acute) exacerbation: Secondary | ICD-10-CM | POA: Diagnosis not present

## 2015-09-02 DIAGNOSIS — I639 Cerebral infarction, unspecified: Secondary | ICD-10-CM

## 2015-09-02 DIAGNOSIS — I69398 Other sequelae of cerebral infarction: Secondary | ICD-10-CM

## 2015-09-02 DIAGNOSIS — I63342 Cerebral infarction due to thrombosis of left cerebellar artery: Secondary | ICD-10-CM | POA: Diagnosis not present

## 2015-09-02 NOTE — Progress Notes (Signed)
Reason for visit: Myasthenia gravis, recent stroke  [name] is an 75 y.o. male  History of present illness:  [name] is a 75 year old right-handed white male with a history of an admission to [facility] on 06/19/2015 with a three-day history of vertigo. The patient had been in the hospital elsewhere for about 3 days with what was thought to be dehydration. The patient was discharged, and he went to [facility] and a left cerebellar stroke was identified. The patient was admitted for an evaluation. He was found to have an occlusion of the left vertebral artery as the source of the stroke. He has been on warfarin for a history of pulmonary embolism, he was switched to Eliquis. The patient required inpatient rehabilitation, he has done quite well. The patient reported some problems with speech alteration, and gait instability with the stroke. He denies any significant headache, no numbness or true weakness of the extremities. He has had some problems with swallowing that has predated the stroke event. He is to undergo an evaluation through a gastroenterologist in the near future. The patient at this point is able to walk without any assistance. He indicates that his myasthenia has been under good control, he denies any significant problems with double vision or ptosis of the eyes. He still has occasional episodes of dizziness, but this is not severe. He returns for an evaluation.  Past Medical History:  Diagnosis Date  . Carotid artery disease ([place])    a. Duplex 05/2014: 123456 RICA, 123456 LICA, elevated velocities in right subclavian artery, normal left subclavian artery..  . Chronic anticoagulation   . Chronic edema   . CML (chronic myeloid leukemia) ([place])   . Conjunctivitis unspecified   . Coronary atherosclerosis of unspecified type of vessel, native or graft    a. cath in 2003 showed 10-20% LM, scattered 20% prox-mid LAD, 30% more distal LAD, 50-60% stenosis of LAD towards apex,  20% Cx, 30% dRCA, focal 95% stenosis in smaller of 2 branches of PDA, EF 60-65%.  . Diabetes mellitus ([place])   . Diverticulosis of colon (without mention of hemorrhage)   . DJD (degenerative joint disease)   . DVT (deep venous thrombosis) [facility]) June 2013  . Dystonia   . Esophageal stricture   . Essential hypertension   . External hemorrhoids without mention of complication   . Gastroparesis   . GERD (gastroesophageal reflux disease)   . Glucose intolerance (impaired glucose tolerance)    history of  . History of kidney stones   . History of pneumonia   . HOH (hard of hearing)    hearing aids  . Hx of colonic polyps   . IBS (irritable bowel syndrome)   . Internal hemorrhoids without mention of complication   . Mixed hyperlipidemia   . Myasthenia gravis ([place])    uses prednisone  . Osteoporosis 2016  . PONV (postoperative nausea and vomiting)   . Ptosis   . Pulmonary embolism [facility]) June 2013   a. Bilateral PE 07/2011.  Marland Kitchen Rash and other nonspecific skin eruption   . 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 Tubulovillous adenoma polyp of colon    2010    Past Surgical History:  Procedure Laterality Date  . ANGIOPLASTY  2001  . BACK SURGERY     x2, lumbar and cervical  . CARDIAC CATHETERIZATION    . CATARACT EXTRACTION Bilateral 12/12  . ESOPHAGOGASTRODUODENOSCOPY ENDOSCOPY  04/2015  . LUMBAR LAMINECTOMY/DECOMPRESSION MICRODISCECTOMY Right 09/14/2012  Procedure: Right Lumbar three-four, four-five, Lumbar five-Sacral one decompressive laminectomy;  Surgeon: [name], MD;  Location: [place];  Service: Neurosurgery;  Laterality: Right;  . SHOULDER SURGERY Left 05/07/2009  . TONSILLECTOMY      Family History  Problem Relation Age of Onset  . Heart disease Mother   . Heart attack Mother   . Stroke Mother   . Heart disease Father   . Heart attack Father   . Stroke Father   . Breast cancer Sister     Twin   . Heart attack Sister   . Dementia Sister   . Heart  disease Sister   . Heart attack Sister   . Clotting disorder Sister   . Heart attack Sister   . Colon cancer Neg Hx     Social history:  reports that he has never smoked. He has never used smokeless tobacco. He reports that he does not drink alcohol or use drugs.    Allergies  Allergen Reactions  . Oxycodone-Acetaminophen Other (See Comments)    "too strong"  . Phenergan [Promethazine Hcl]     Eye swelling when combined with Zofran? Has taken it in the past without side effects  . Zofran [Ondansetron Hcl]     Reaction when given with phenergan?  . Sulfamethoxazole Rash and Itching    Medications:  Prior to Admission medications   Medication Sig Start Date End Date Taking? Authorizing Provider  apixaban (ELIQUIS) 5 MG TABS tablet Take 1 tablet (5 mg total) by mouth 2 (two) times daily. 07/29/15  Yes Kathrynn Ducking, MD  brimonidine-timolol (COMBIGAN) 0.2-0.5 % ophthalmic solution Place 1 drop into both eyes every 12 (twelve) hours.   Yes Historical Provider, MD  esomeprazole (NEXIUM) 40 MG capsule Take 1 capsule (40 mg total) by mouth 2 (two) times daily. Patient taking differently: Take 40 mg by mouth every morning.  04/01/15  Yes Lori P Hvozdovic, PA-C  furosemide (LASIX) 20 MG tablet Take 20 mg by mouth daily.  03/28/15  Yes Historical Provider, MD  imatinib (GLEEVEC) 100 MG tablet Take 2 tablets (200 mg total) by mouth 2 (two) times daily with a meal. Take with meals and large glass of water.Caution:Chemotherapy. 06/27/15  Yes Ivan Anchors Love, PA-C  isosorbide mononitrate (IMDUR) 30 MG 24 hr tablet TAKE 1 TABLET EVERY DAY 09/04/14  Yes Sherren Mocha, MD  lisinopril (PRINIVIL,ZESTRIL) 5 MG tablet Take 1 tablet (5 mg total) by mouth daily. 06/28/15  Yes Ivan Anchors Love, PA-C  loperamide (IMODIUM) 2 MG capsule Take 2 mg by mouth every morning. For diarrhea   Yes Historical Provider, MD  LORazepam (ATIVAN) 0.5 MG tablet Take 1 tablet (0.5 mg total) by mouth at bedtime. As needed for sleep  07/11/15  Yes Renato Shin, MD  Melatonin 3 MG TABS Take by mouth.   Yes Historical Provider, MD  metoprolol succinate (TOPROL-XL) 25 MG 24 hr tablet Take 0.5 tablets (12.5 mg total) by mouth daily. 07/11/15  Yes Renato Shin, MD  Multiple Vitamin (MULTIVITAMIN) tablet Take 1 tablet by mouth daily.   Yes Historical Provider, MD  nitroGLYCERIN (NITROSTAT) 0.4 MG SL tablet Place 1 tablet (0.4 mg total) under the tongue every 5 (five) minutes x 3 doses as needed for chest pain. 05/22/14  Yes Jerline Pain, MD  predniSONE (DELTASONE) 5 MG tablet TAKE 1 TABLET (5 MG TOTAL) BY MOUTH DAILY. 07/31/15  Yes Kathrynn Ducking, MD  promethazine (PHENERGAN) 12.5 MG tablet Take 1 tablet (12.5  mg total) by mouth every 6 (six) hours as needed for nausea or vomiting. 06/28/15  Yes Ivan Anchors Love, PA-C  pyridostigmine (MESTINON) 60 MG tablet Take 1 tablet (60 mg total) by mouth 3 (three) times daily. Patient taking differently: Take 60 mg by mouth 2 (two) times daily.  11/08/14  Yes Kathrynn Ducking, MD  rosuvastatin (CRESTOR) 10 MG tablet TAKE 1/2 TABLETS BY MOUTH DAILY. 06/10/15  Yes Sherren Mocha, MD    ROS:  Out of a complete 14 system review of symptoms, the patient complains only of the following symptoms, and all other reviewed systems are negative.  Nausea, diarrhea Frequent waking  Blood pressure 126/70, pulse (!) 51, height 5\' 6"  (1.676 m), weight 178 lb (80.7 kg).  Physical Exam  General: The patient is alert and cooperative at the time of the examination.  Skin: No significant peripheral edema is noted.   Neurologic Exam  Mental status: The patient is alert and oriented x 3 at the time of the examination. The patient has apparent normal recent and remote memory, with an apparently normal attention span and concentration ability.   Cranial nerves: Facial symmetry is present. Speech is normal, no aphasia or dysarthria is noted. Extraocular movements are full. Visual fields are full. Some evidence of  course end-gaze nystagmus is seen bilaterally.  Motor: The patient has good strength in all 4 extremities. Rapid alternating movements with the hands is slightly decreased on the left.  Sensory examination: Soft touch sensation is symmetric on the face, arms, and legs.  Coordination: The patient has good finger-nose-finger and heel-to-shin bilaterally.  Gait and station: The patient has a normal gait. Tandem gait is unsteady. Romberg is negative. No drift is seen.  Reflexes: Deep tendon reflexes are symmetric.   [name] Contrast 06/20/2015   Acute to early subacute nonhemorrhagic LEFT posterior inferior cerebellar artery territory infarct. LEFT vertebral artery occlusion (less likely slow flow). Moderate chronic small vessel ischemic disease. No convincing evidence of abnormal intracranial enhancement on moderately motion degraded post gadolinium sequences.   * MRI scan images were reviewed online. I agree with the written report.    CTA head and neck - 1. Acute/early subacute left cerebellar infarct. No hemorrhage. 2. Proximal left V2 vertebral artery occlusion with intermittent distal reconstitution. 3. Patent and dominant right vertebral artery with mild V4 stenosis. 4. Right greater than left intracranial and extracranial carotid artery atherosclerosis without significant stenosis.   TTE  Left ventricle: The cavity size was normal. Wall thickness was  increased in a pattern of mild LVH. There was mild focal basal  hypertrophy of the septum. Systolic function was normal. The  estimated ejection fraction was in the range of 55% to 60%. Wall  motion was normal; there were no regional wall motion  abnormalities. Doppler parameters are consistent with abnormal  left ventricular relaxation (grade 1 diastolic dysfunction).  Doppler parameters are consistent with high ventricular filling  pressure. - Left atrium: The atrium was mildly dilated. Impressions: -  Normal LV systolic function; grade 1 diastolic dysfunction;  elevated LV filling pressure; mild LVH; mild LAE.   Assessment/Plan:  1. Left cerebellar stroke, left vertebral artery occlusion  2. Myasthenia gravis with ocular features   The patient has sustained a moderate size left cerebellar stroke, his recovery has been very good following this event. The patient will remain on Eliquis at this time. The patient will continue with the Mestinon and low-dose prednisone, the myasthenia gravis has been well controlled.  The patient reports some issues with insomnia since the stroke, he will go up on the melatonin dose to 6 mg at night. The patient will follow-up through this office in October for the next scheduled appointment.  Jill Alexanders MD 09/02/2015 10:21 AM  [facility] [address] [place], [place] [address]  Phone [phone] Fax [phone]

## 2015-09-02 NOTE — Patient Instructions (Addendum)
Stroke Prevention Some medical conditions and behaviors are associated with an increased chance of having a stroke. You may prevent a stroke by making healthy choices and managing medical conditions. HOW CAN I REDUCE MY RISK OF HAVING A STROKE?   Stay physically active. Get at least 30 minutes of activity on most or all days.  Do not smoke. It may also be helpful to avoid exposure to secondhand smoke.  Limit alcohol use. Moderate alcohol use is considered to be:  No more than 2 drinks per day for men.  No more than 1 drink per day for nonpregnant women.  Eat healthy foods. This involves:  Eating 5 or more servings of fruits and vegetables a day.  Making dietary changes that address high blood pressure (hypertension), high cholesterol, diabetes, or obesity.  Manage your cholesterol levels.  Making food choices that are high in fiber and low in saturated fat, trans fat, and cholesterol may control cholesterol levels.  Take any prescribed medicines to control cholesterol as directed by your health care provider.  Manage your diabetes.  Controlling your carbohydrate and sugar intake is recommended to manage diabetes.  Take any prescribed medicines to control diabetes as directed by your health care provider.  Control your hypertension.  Making food choices that are low in salt (sodium), saturated fat, trans fat, and cholesterol is recommended to manage hypertension.  Ask your health care provider if you need treatment to lower your blood pressure. Take any prescribed medicines to control hypertension as directed by your health care provider.  If you are 18-39 years of age, have your blood pressure checked every 3-5 years. If you are 40 years of age or older, have your blood pressure checked every year.  Maintain a healthy weight.  Reducing calorie intake and making food choices that are low in sodium, saturated fat, trans fat, and cholesterol are recommended to manage  weight.  Stop drug abuse.  Avoid taking birth control pills.  Talk to your health care provider about the risks of taking birth control pills if you are over 35 years old, smoke, get migraines, or have ever had a blood clot.  Get evaluated for sleep disorders (sleep apnea).  Talk to your health care provider about getting a sleep evaluation if you snore a lot or have excessive sleepiness.  Take medicines only as directed by your health care provider.  For some people, aspirin or blood thinners (anticoagulants) are helpful in reducing the risk of forming abnormal blood clots that can lead to stroke. If you have the irregular heart rhythm of atrial fibrillation, you should be on a blood thinner unless there is a good reason you cannot take them.  Understand all your medicine instructions.  Make sure that other conditions (such as anemia or atherosclerosis) are addressed. SEEK IMMEDIATE MEDICAL CARE IF:   You have sudden weakness or numbness of the face, arm, or leg, especially on one side of the body.  Your face or eyelid droops to one side.  You have sudden confusion.  You have trouble speaking (aphasia) or understanding.  You have sudden trouble seeing in one or both eyes.  You have sudden trouble walking.  You have dizziness.  You have a loss of balance or coordination.  You have a sudden, severe headache with no known cause.  You have new chest pain or an irregular heartbeat. Any of these symptoms may represent a serious problem that is an emergency. Do not wait to see if the symptoms will   go away. Get medical help at once. Call your local emergency services (911 in [place] Do not drive yourself to the hospital.   This information is not intended to replace advice given to you by your health care provider. Make sure you discuss any questions you have with your health care provider.   Document Released: 03/05/2004 Document Revised: 02/16/2014 Document Reviewed:  07/29/2012 Elsevier Interactive Patient Education 2016 Else[employer] Inc.  

## 2015-09-03 ENCOUNTER — Other Ambulatory Visit: Payer: Self-pay | Admitting: *Deleted

## 2015-09-03 MED ORDER — PYRIDOSTIGMINE BROMIDE 60 MG PO TABS: 60.0000 mg | ORAL_TABLET | Freq: Three times a day (TID) | ORAL | 11 refills | Status: DC

## 2015-09-08 ENCOUNTER — Other Ambulatory Visit: Payer: Self-pay | Admitting: Cardiovascular Disease

## 2015-09-12 ENCOUNTER — Encounter: Payer: Self-pay | Admitting: Cardiovascular Disease

## 2015-09-12 ENCOUNTER — Ambulatory Visit (INDEPENDENT_AMBULATORY_CARE_PROVIDER_SITE_OTHER): Payer: Medicare Other | Admitting: Cardiovascular Disease

## 2015-09-12 VITALS — BP 120/58 | HR 54 | Ht 67.0 in | Wt 181.8 lb

## 2015-09-12 DIAGNOSIS — I10 Essential (primary) hypertension: Secondary | ICD-10-CM

## 2015-09-12 DIAGNOSIS — I25118 Atherosclerotic heart disease of native coronary artery with other forms of angina pectoris: Secondary | ICD-10-CM | POA: Diagnosis not present

## 2015-09-12 DIAGNOSIS — I639 Cerebral infarction, unspecified: Secondary | ICD-10-CM | POA: Diagnosis not present

## 2015-09-12 NOTE — Progress Notes (Signed)
Cardiology Office Note Date:  09/12/2015   ID:  [name], DOB [birth year], MRN [id number]  PCP:  Renato Shin, MD  Cardiologist:  Sherren Mocha, MD    Chief Complaint  Patient presents with  . Follow-up    History of Present Illness: [name] is a 75 y.o. male who presents for Follow-up evaluation. Patient has a history of coronary artery disease with distal vessel disease pattern by cardiac catheterization in 2003. Medical therapy was recommended at that time. 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 He was followed in inpatient rehabilitation and has had a fairly good recovery. He is ambulating well but not back to his baseline yet. He has some difficulty with word finding times. He feels like he "gives out" with activities. He has had no orthopnea or PND. He has mild chronic leg swelling is unchanged. He's been maintained on a low-dose oral diuretics over time. An echocardiogram was performed and it demonstrates normal LV systolic function with grade 1 diastolic dysfunction. He complains of a feeling of pressure in his chest that occurs at rest. Symptoms last up to a few minutes. He is not sure if it is heartburn or cardiac related. There is no radiation of pain. There is no exertional component.   Past Medical History:  Diagnosis Date  . Carotid artery disease ([place])    a. Duplex 05/2014: 123456 RICA, 123456 LICA, elevated velocities in right subclavian artery, normal left subclavian artery..  . Chronic anticoagulation   . Chronic edema   . 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 Conjunctivitis unspecified   . Coronary atherosclerosis of unspecified type of vessel, native or graft    a. cath in 2003 showed 10-20% LM, scattered 20% prox-mid LAD, 30% more distal LAD, 50-60% stenosis of LAD towards apex, 20% Cx, 30% dRCA, focal 95% stenosis in smaller of 2 branches of PDA, EF 60-65%.    . Diabetes mellitus ([place])   . Diverticulosis of colon (without mention of hemorrhage)   . DJD (degenerative joint disease)   . DVT (deep venous thrombosis) [facility]) June 2013  . Dystonia   . Esophageal stricture   . Essential hypertension   . External hemorrhoids without mention of complication   . Gastroparesis   . GERD (gastroesophageal reflux disease)   . Glucose intolerance (impaired glucose tolerance)    history of  . History of kidney stones   . History of pneumonia   . HOH (hard of hearing)    hearing aids  . Hx of colonic polyps   . IBS (irritable bowel syndrome)   . Internal hemorrhoids without mention of complication   . Mixed hyperlipidemia   . Myasthenia gravis ([place])    uses prednisone  . Osteoporosis 2016  . PONV (postoperative nausea and vomiting)   . Ptosis   . Pulmonary embolism [facility]) June 2013   a. Bilateral PE 07/2011.  Marland Kitchen Rash and other nonspecific skin eruption   . 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 Tubulovillous adenoma polyp of colon    2010    Past Surgical History:  Procedure Laterality Date  . ANGIOPLASTY  2001  . BACK SURGERY     x2, lumbar and cervical  . CARDIAC CATHETERIZATION    . CATARACT EXTRACTION Bilateral 12/12  . ESOPHAGOGASTRODUODENOSCOPY ENDOSCOPY  04/2015  . LUMBAR LAMINECTOMY/DECOMPRESSION MICRODISCECTOMY Right 09/14/2012   Procedure: Right Lumbar three-four, four-five, Lumbar five-Sacral one  decompressive laminectomy;  Surgeon: [name], MD;  Location: [place] [facility];  Service: Neurosurgery;  Laterality: Right;  . SHOULDER SURGERY Left 05/07/2009  . TONSILLECTOMY      Current Outpatient Prescriptions  Medication Sig Dispense Refill  . apixaban (ELIQUIS) 5 MG TABS tablet Take 1 tablet (5 mg total) by mouth 2 (two) times daily. 60 tablet 3  . brimonidine-timolol (COMBIGAN) 0.2-0.5 % ophthalmic solution Place 1 drop into both eyes every 12 (twelve) hours.    Marland Kitchen esomeprazole (NEXIUM) 40 MG capsule Take 40 mg by mouth daily  at 12 noon.    . furosemide (LASIX) 20 MG tablet Take 20 mg by mouth daily.     Marland Kitchen imatinib (GLEEVEC) 100 MG tablet Take 2 tablets (200 mg total) by mouth 2 (two) times daily with a meal. Take with meals and large glass of water.Caution:Chemotherapy.    . isosorbide mononitrate (IMDUR) 30 MG 24 hr tablet TAKE 1 TABLET EVERY DAY 90 tablet 3  . lisinopril (PRINIVIL,ZESTRIL) 5 MG tablet Take 1 tablet (5 mg total) by mouth daily. 30 tablet 0  . loperamide (IMODIUM) 2 MG capsule Take 2 mg by mouth every morning. For diarrhea    . LORazepam (ATIVAN) 0.5 MG tablet Take 1 tablet (0.5 mg total) by mouth at bedtime. As needed for sleep 30 tablet 2  . Melatonin 3 MG TABS Take by mouth.    . metoprolol succinate (TOPROL-XL) 25 MG 24 hr tablet Take 0.5 tablets (12.5 mg total) by mouth daily. 45 tablet 3  . Multiple Vitamin (MULTIVITAMIN) tablet Take 1 tablet by mouth daily.    . nitroGLYCERIN (NITROSTAT) 0.4 MG SL tablet Place 1 tablet (0.4 mg total) under the tongue every 5 (five) minutes x 3 doses as needed for chest pain. 90 tablet 3  . predniSONE (DELTASONE) 5 MG tablet TAKE 1 TABLET (5 MG TOTAL) BY MOUTH DAILY. 90 tablet 0  . promethazine (PHENERGAN) 12.5 MG tablet Take 1 tablet (12.5 mg total) by mouth every 6 (six) hours as needed for nausea or vomiting. 30 tablet 0  . pyridostigmine (MESTINON) 60 MG tablet Take 1 tablet (60 mg total) by mouth 3 (three) times daily. 90 tablet 11  . rosuvastatin (CRESTOR) 10 MG tablet TAKE 1/2 TABLETS BY MOUTH DAILY. 45 tablet 0   No current facility-administered medications for this visit.     Allergies:   Oxycodone-acetaminophen; Phenergan [promethazine hcl]; Zofran [ondansetron hcl]; and Sulfamethoxazole   Social History:  The patient  reports that he has never smoked. He has never used smokeless tobacco. He reports that he does not drink alcohol or use drugs.   Family History:  The patient's  family history includes Breast cancer in his sister; Clotting disorder  in his sister; Dementia in his sister; Heart attack in his father, mother, sister, sister, and sister; Heart disease in his father, mother, and sister; Stroke in his father and mother.    ROS:  Please see the history of present illness.  Otherwise, review of systems is positive for Hearing loss, ankle swelling, back pain, easy bruising, balance problems.  All other systems are reviewed and negative.    PHYSICAL EXAM: VS:  BP (!) 120/58 (BP Location: Left Arm, Patient Position: Sitting, Cuff Size: Normal)   Pulse (!) 54   Ht 5\' 7"  (1.702 m)   Wt 181 lb 12.8 oz (82.5 kg)   SpO2 98%   BMI 28.47 kg/m  , BMI Body mass index is 28.47 kg/m. GEN: Well nourished,  well developed, in no acute distress  HEENT: normal  Neck: no JVD, no masses. No carotid bruits Cardiac: RRR without murmur or gallop                Respiratory:  clear to auscultation bilaterally, normal work of breathing GI: soft, nontender, nondistended, + BS MS: no deformity or atrophy  Ext: no pretibial edema, pedal pulses 2+= bilaterally Skin: warm and dry, no rash Neuro:  Strength and sensation are intact Psych: euthymic mood, full affect  EKG:  EKG is not ordered today. Recent Labs: 03/13/2015: Pro B Natriuretic peptide (BNP) 58.0 06/20/2015: TSH 1.589 06/25/2015: ALT 28; BUN 10; Creatinine, Ser 1.44; Hemoglobin 12.3; Platelets 220; Potassium 3.7; Sodium 140   Lipid Panel     Component Value Date/Time   CHOL 104 06/21/2015 0352   TRIG 129 06/21/2015 0352   HDL 26 (L) 06/21/2015 0352   CHOLHDL 4.0 06/21/2015 0352   VLDL 26 06/21/2015 0352   LDLCALC 52 06/21/2015 0352   LDLDIRECT 59.8 10/12/2012 0737      Wt Readings from Last 3 Encounters:  09/12/15 181 lb 12.8 oz (82.5 kg)  09/02/15 178 lb (80.7 kg)  07/11/15 181 lb (82.1 kg)     Cardiac Studies Reviewed: 2D Echo 06/20/2015: Study Conclusions  - Left ventricle: The cavity size was normal. Wall thickness was   increased in a pattern of mild LVH. There was  mild focal basal   hypertrophy of the septum. Systolic function was normal. The   estimated ejection fraction was in the range of 55% to 60%. Wall   motion was normal; there were no regional wall motion   abnormalities. Doppler parameters are consistent with abnormal   left ventricular relaxation (grade 1 diastolic dysfunction).   Doppler parameters are consistent with high ventricular filling   pressure. - Left atrium: The atrium was mildly dilated.  Impressions:  - Normal LV systolic function; grade 1 diastolic dysfunction;   elevated LV filling pressure; mild LVH; mild LAE.  Myoview 06-07-2013: QPS Raw Data Images:  Normal; no motion artifact; normal heart/lung ratio. Stress Images:  Normal homogeneous uptake in all areas of the myocardium. Rest Images:  Normal homogeneous uptake in all areas of the myocardium. Subtraction (SDS):  No evidence of ischemia. Transient Ischemic Dilatation (Normal <1.22):  0.91 Lung/Heart Ratio (Normal <0.45):  0.28  Quantitative Gated Spect Images QGS EDV:  88 ml QGS ESV:  27 ml  Impression Exercise Capacity:  Fair exercise capacity. BP Response:  Normal blood pressure response. Clinical Symptoms:  The exercise was limited by dyspnea and fatigue. ECG Impression:  Significant ST abnormalities consistent with ischemia. PVCs and rare couplets are seen. Comparison with Prior Nuclear Study: No significant change from previous study (previous study was a [place] pharmacological stress test).  Overall Impression:  Low risk stress nuclear study with ECG abnormalities that may represent a "false positive ECG stress test".  LV Ejection Fraction: 69%.  LV Wall Motion:  NL LV Function; NL Wall Motion  ASSESSMENT AND PLAN: 1.  CAD, native vessel, with atypical symptoms of angina: The patient has a history of diffuse distal vessel CAD. He has multiple medical problems. Difficult to sort out his symptoms. With known CAD, vascular disease with recent  stroke and left vertebral occlusion, and other risk factors, I have recommended a Lexiscan Myoview stress test for further risk stratification.  2. Essential hypertension: Blood pressure is controlled on current medications. Medicines are reviewed today and should be continued.  3.  Hyperlipidemia: Most recent lipids reviewed. He is treated with Crestor.  4. Edema: Patient has mild leg edema. I suspect this is multifactorial. In review of his exam and echo studies I do not see findings consistent with congestive heart failure. He's on chronic prednisone which may contribute.   Current medicines are reviewed with the patient today.  The patient does not have concerns regarding medicines.  Labs/ tests ordered today include:  No orders of the defined types were placed in this encounter.   Disposition:   FU one year  Signed, Sherren Mocha, MD  09/12/2015 9:37 AM    [facility] [place], [place], [place]  [address] Phone: [phone]; Fax: [phone]

## 2015-09-12 NOTE — Patient Instructions (Signed)
Medication Instructions:  Your physician recommends that you continue on your current medications as directed. Please refer to the Current Medication list given to you today.  Labwork: No new orders.   Testing/Procedures: Your physician has requested that you have a lexiscan myoview. For further information please visit [email or web address]. Please follow instruction sheet, as given.  Follow-Up: Your physician wants you to follow-up in: 1 YEAR with Dr Cooper.  You will receive a reminder letter in the mail two months in advance. If you don't receive a letter, please call our office to schedule the follow-up appointment.   Any Other Special Instructions Will Be Listed Below (If Applicable).     If you need a refill on your cardiac medications before your next appointment, please call your pharmacy.   

## 2015-09-17 ENCOUNTER — Other Ambulatory Visit: Payer: Self-pay | Admitting: Cardiovascular Disease

## 2015-09-20 ENCOUNTER — Other Ambulatory Visit: Payer: Self-pay

## 2015-09-20 MED ORDER — LISINOPRIL 5 MG PO TABS: 5.0000 mg | ORAL_TABLET | Freq: Every day | ORAL | 1 refills | Status: DC

## 2015-09-24 ENCOUNTER — Telehealth (HOSPITAL_COMMUNITY): Payer: Self-pay | Admitting: *Deleted

## 2015-09-24 NOTE — Telephone Encounter (Signed)
Patient given detailed instructions per Myocardial Perfusion Study Information Sheet for the test on 09/27/15 at [facility]. Patient notified to arrive 15 minutes early and that it is imperative to arrive on time for appointment to keep from having the test rescheduled.  If you need to cancel or reschedule your appointment, please call the office within 24 hours of your appointment. Failure to do so may result in a cancellation of your appointment, and a $50 no show fee. Patient verbalized understanding.Shyah Cadmus, Ranae Palms

## 2015-09-26 ENCOUNTER — Telehealth (HOSPITAL_COMMUNITY): Payer: Self-pay

## 2015-09-26 NOTE — Telephone Encounter (Signed)
Encounter complete. 

## 2015-09-27 ENCOUNTER — Encounter (HOSPITAL_COMMUNITY): Payer: Medicare Other

## 2015-09-27 ENCOUNTER — Ambulatory Visit (HOSPITAL_COMMUNITY)
Admission: RE | Admit: 2015-09-27 | Discharge: 2015-09-27 | Disposition: A | Discharge status: Home / Self Care | Payer: Medicare Other | Source: Ambulatory Visit | Attending: Cardiovascular Disease | Admitting: Cardiovascular Disease

## 2015-09-27 DIAGNOSIS — I10 Essential (primary) hypertension: Secondary | ICD-10-CM

## 2015-09-27 DIAGNOSIS — I129 Hypertensive chronic kidney disease with stage 1 through stage 4 chronic kidney disease, or unspecified chronic kidney disease: Secondary | ICD-10-CM | POA: Insufficient documentation

## 2015-09-27 DIAGNOSIS — I779 Disorder of arteries and arterioles, unspecified: Secondary | ICD-10-CM | POA: Insufficient documentation

## 2015-09-27 DIAGNOSIS — N189 Chronic kidney disease, unspecified: Secondary | ICD-10-CM | POA: Diagnosis not present

## 2015-09-27 DIAGNOSIS — R079 Chest pain, unspecified: Secondary | ICD-10-CM | POA: Diagnosis not present

## 2015-09-27 DIAGNOSIS — E663 Overweight: Secondary | ICD-10-CM | POA: Insufficient documentation

## 2015-09-27 DIAGNOSIS — Z6828 Body mass index (BMI) 28.0-28.9, adult: Secondary | ICD-10-CM | POA: Insufficient documentation

## 2015-09-27 DIAGNOSIS — E1122 Type 2 diabetes mellitus with diabetic chronic kidney disease: Secondary | ICD-10-CM | POA: Diagnosis not present

## 2015-09-27 DIAGNOSIS — Z8249 Family history of ischemic heart disease and other diseases of the circulatory system: Secondary | ICD-10-CM | POA: Insufficient documentation

## 2015-09-27 DIAGNOSIS — I25118 Atherosclerotic heart disease of native coronary artery with other forms of angina pectoris: Secondary | ICD-10-CM | POA: Diagnosis not present

## 2015-09-27 LAB — MYOCARDIAL PERFUSION IMAGING
CHL CUP NUCLEAR SRS: 1
CSEPPHR: 66 {beats}/min
LV dias vol: 88 mL (ref 62–150)
LV sys vol: 44 mL
Rest HR: 46 {beats}/min
SDS: 3
SSS: 4
TID: 1.1

## 2015-09-27 MED ORDER — TECHNETIUM TC 99M TETROFOSMIN IV KIT
30.1000 | PACK | Freq: Once | INTRAVENOUS | Status: AC | PRN
  Administered 2015-09-27: 30.1 via INTRAVENOUS
  Filled 2015-09-27: qty 30

## 2015-09-27 MED ORDER — REGADENOSON 0.4 MG/5ML IV SOLN
0.4000 mg | Freq: Once | INTRAVENOUS | Status: AC
  Administered 2015-09-27: 0.4 mg via INTRAVENOUS

## 2015-09-27 MED ORDER — TECHNETIUM TC 99M TETROFOSMIN IV KIT
10.2000 | PACK | Freq: Once | INTRAVENOUS | Status: AC | PRN
  Administered 2015-09-27: 10 via INTRAVENOUS
  Filled 2015-09-27: qty 10

## 2015-10-10 ENCOUNTER — Other Ambulatory Visit: Payer: Self-pay | Admitting: Neurology

## 2015-10-11 ENCOUNTER — Telehealth: Payer: Self-pay | Admitting: Neurology

## 2015-10-11 ENCOUNTER — Encounter: Payer: Self-pay | Admitting: Endocrinology

## 2015-10-11 ENCOUNTER — Ambulatory Visit (INDEPENDENT_AMBULATORY_CARE_PROVIDER_SITE_OTHER): Payer: Medicare Other | Admitting: Endocrinology

## 2015-10-11 VITALS — BP 112/70 | HR 50 | Ht 67.0 in | Wt 182.0 lb

## 2015-10-11 DIAGNOSIS — Z23 Encounter for immunization: Secondary | ICD-10-CM | POA: Diagnosis not present

## 2015-10-11 DIAGNOSIS — N400 Enlarged prostate without lower urinary tract symptoms: Secondary | ICD-10-CM | POA: Diagnosis not present

## 2015-10-11 DIAGNOSIS — M7660 Achilles tendinitis, unspecified leg: Secondary | ICD-10-CM | POA: Diagnosis not present

## 2015-10-11 DIAGNOSIS — M810 Age-related osteoporosis without current pathological fracture: Secondary | ICD-10-CM | POA: Diagnosis not present

## 2015-10-11 DIAGNOSIS — Z0000 Encounter for general adult medical examination without abnormal findings: Secondary | ICD-10-CM | POA: Diagnosis not present

## 2015-10-11 DIAGNOSIS — I2510 Atherosclerotic heart disease of native coronary artery without angina pectoris: Secondary | ICD-10-CM

## 2015-10-11 MED ORDER — LIDOCAINE 5 % EX PTCH: 1.0000 | MEDICATED_PATCH | CUTANEOUS | 11 refills | Status: DC

## 2015-10-11 MED ORDER — PREDNISONE 5 MG PO TABS: ORAL_TABLET | ORAL | 3 refills | Status: DC

## 2015-10-11 NOTE — Telephone Encounter (Signed)
Patient requesting refill of predniSONE (DELTASONE) 5 MG tablet Pharmacy [id number] - [place], [place] - [address]  Pt aware it is too soon but did not want to chance running out.

## 2015-10-11 NOTE — Progress Notes (Signed)
Subjective:    Patient ID: [name], male    DOB: October 24, 1940, 75 y.o.   MRN: [id number]  HPI The state of at least three ongoing medical problems is addressed today, with interval history of each noted here: Achilles tendonitis: he saw ortho, but wants to f/u here.  lidoderm helps CAD: denies chest pain.  He has fatigue Osteoporosis: he denies LOC Past Medical History:  Diagnosis Date  . Carotid artery disease ([place])    a. Duplex 05/2014: 123456 RICA, 123456 LICA, elevated velocities in right subclavian artery, normal left subclavian artery..  . Chronic anticoagulation   . Chronic edema   . 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 Conjunctivitis unspecified   . Coronary atherosclerosis of unspecified type of vessel, native or graft    a. cath in 2003 showed 10-20% LM, scattered 20% prox-mid LAD, 30% more distal LAD, 50-60% stenosis of LAD towards apex, 20% Cx, 30% dRCA, focal 95% stenosis in smaller of 2 branches of PDA, EF 60-65%.  . Diabetes mellitus ([place])   . Diverticulosis of colon (without mention of hemorrhage)   . DJD (degenerative joint disease)   . DVT (deep venous thrombosis) [facility]) June 2013  . Dystonia   . Esophageal stricture   . Essential hypertension   . External hemorrhoids without mention of complication   . Gastroparesis   . GERD (gastroesophageal reflux disease)   . Glucose intolerance (impaired glucose tolerance)    history of  . History of kidney stones   . History of pneumonia   . HOH (hard of hearing)    hearing aids  . Hx of colonic polyps   . IBS (irritable bowel syndrome)   . Internal hemorrhoids without mention of complication   . Mixed hyperlipidemia   . Myasthenia gravis ([place])    uses prednisone  . Osteoporosis 2016  . PONV (postoperative nausea and vomiting)   . Ptosis   . Pulmonary embolism [facility]) June 2013   a. Bilateral PE 07/2011.  Marland Kitchen Rash and other nonspecific skin eruption   . 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  Tubulovillous adenoma polyp of colon    2010    Past Surgical History:  Procedure Laterality Date  . ANGIOPLASTY  2001  . BACK SURGERY     x2, lumbar and cervical  . CARDIAC CATHETERIZATION    . CATARACT EXTRACTION Bilateral 12/12  . ESOPHAGOGASTRODUODENOSCOPY ENDOSCOPY  04/2015  . LUMBAR LAMINECTOMY/DECOMPRESSION MICRODISCECTOMY Right 09/14/2012   Procedure: Right Lumbar three-four, four-five, Lumbar five-Sacral one decompressive laminectomy;  Surgeon: [name], MD;  Location: [place] [facility];  Service: Neurosurgery;  Laterality: Right;  . SHOULDER SURGERY Left 05/07/2009  . TONSILLECTOMY      Social History   Social History  . Marital status: Married    Spouse name: Hassan Rowan  . Number of children: 2  . Years of education: 1-College   Occupational History  . retired Retired    Retired   Social History Main Topics  . Smoking status: Never Smoker  . Smokeless tobacco: Never Used  . Alcohol use No  . Drug use: No  . Sexual activity: Not Currently   Other Topics Concern  . Not on file   Social History Narrative   Lives at home w/ his wife   Patient is right handed.   Patient has 1-2 cups of caffeine daily.    Current Outpatient Prescriptions on File Prior to Visit  Medication Sig Dispense Refill  . apixaban (  ELIQUIS) 5 MG TABS tablet Take 1 tablet (5 mg total) by mouth 2 (two) times daily. 60 tablet 3  . brimonidine-timolol (COMBIGAN) 0.2-0.5 % ophthalmic solution Place 1 drop into both eyes every 12 (twelve) hours.    Marland Kitchen esomeprazole (NEXIUM) 40 MG capsule Take 40 mg by mouth daily at 12 noon.    . furosemide (LASIX) 20 MG tablet Take 20 mg by mouth daily.     Marland Kitchen imatinib (GLEEVEC) 100 MG tablet Take 2 tablets (200 mg total) by mouth 2 (two) times daily with a meal. Take with meals and large glass of water.Caution:Chemotherapy.    . isosorbide mononitrate (IMDUR) 30 MG 24 hr tablet Take 1 tablet (30 mg total) by mouth daily. 90 tablet 3  . lisinopril (PRINIVIL,ZESTRIL)  5 MG tablet Take 1 tablet (5 mg total) by mouth daily. 30 tablet 1  . loperamide (IMODIUM) 2 MG capsule Take 2 mg by mouth every morning. For diarrhea    . LORazepam (ATIVAN) 0.5 MG tablet Take 1 tablet (0.5 mg total) by mouth at bedtime. As needed for sleep 30 tablet 2  . Melatonin 3 MG TABS Take by mouth.    . Multiple Vitamin (MULTIVITAMIN) tablet Take 1 tablet by mouth daily.    . nitroGLYCERIN (NITROSTAT) 0.4 MG SL tablet Place 1 tablet (0.4 mg total) under the tongue every 5 (five) minutes x 3 doses as needed for chest pain. 90 tablet 3  . promethazine (PHENERGAN) 12.5 MG tablet Take 1 tablet (12.5 mg total) by mouth every 6 (six) hours as needed for nausea or vomiting. 30 tablet 0  . pyridostigmine (MESTINON) 60 MG tablet Take 1 tablet (60 mg total) by mouth 3 (three) times daily. 90 tablet 11  . rosuvastatin (CRESTOR) 10 MG tablet TAKE 1/2 TABLETS BY MOUTH DAILY. 45 tablet 0  . [DISCONTINUED] zolpidem (AMBIEN) 10 MG tablet Take 10 mg by mouth at bedtime as needed.      No current facility-administered medications on file prior to visit.     Allergies  Allergen Reactions  . Oxycodone-Acetaminophen Other (See Comments)    "too strong"  . Phenergan [Promethazine Hcl]     Eye swelling when combined with Zofran? Has taken it in the past without side effects  . Zofran [Ondansetron Hcl]     Reaction when given with phenergan?  . Sulfamethoxazole Rash and Itching    Family History  Problem Relation Age of Onset  . Heart disease Mother   . Heart attack Mother   . Stroke Mother   . Heart disease Father   . Heart attack Father   . Stroke Father   . Breast cancer Sister     Twin   . Heart attack Sister   . Dementia Sister   . Heart disease Sister   . Heart attack Sister   . Clotting disorder Sister   . Heart attack Sister   . Colon cancer Neg Hx     BP 112/70   Pulse (!) 50   Ht 5\' 7"  (1.702 m)   Wt 182 lb (82.6 kg)   SpO2 96%   BMI 28.51 kg/m   Review of  Systems Denies weight change and fever.      Objective:   Physical Exam VITAL SIGNS:  See vs page GENERAL: no distress Pulses: dorsalis pedis intact bilat.   MSK: no deformity of the feet CV: no leg edema Skin:  no ulcer on the feet.  normal color and temp on the feet.  Neuro: sensation is intact to touch on the feet      Assessment & Plan:  Achilles tendonitis: Please continue the same lidoderm.  I refilled CAD: in view of fatigue, he should D/C [place] Osteoporosis: I requested reclast   Subjective:   Patient here for Medicare annual wellness visit and management of other chronic and acute problems.     Risk factors: advanced age    [facility] of Physicians Providing Medical Care to Patient:  See "snapshot"   Activities of Daily Living: In your present state of health, do you have any difficulty performing the following activities?:  Preparing food and eating?: No  Bathing yourself: No  Getting dressed: No  Using the toilet:No  Moving around from place to place: No  In the past year have you fallen or had a near fall?: No    Home Safety: Has smoke detector and wears seat belts. Firearms are safely stored. No excess sun exposure.  Diet and Exercise  Current exercise habits: limited by recent CVA Dietary issues discussed: pt reports a healthy diet   Depression Screen  Q1: Over the past two weeks, have you felt down, depressed or hopeless? no  Q2: Over the past two weeks, have you felt little interest or pleasure in doing things? no   The following portions of the patient's history were reviewed and updated as appropriate: allergies, current medications, past family history, past medical history, past social history, past surgical history and problem list.   Review of Systems  No change in chronic hearing and visual losses Objective:   Vision:  Advertising account executive, so he declines VA today Hearing: grossly normal Body mass index:  See vs page Msk: slowly performs  "get-up-and-go" from a sitting position Cognitive Impairment Assessment: cognition, memory and judgment appear normal.  remembers 3/3 at 5 minutes.  excellent recall.  can easily read and write a sentence.  alert and oriented x 3.    Assessment:   Medicare wellness utd on preventive parameters.    Plan:   During the course of the visit the patient was educated and counseled about appropriate screening and preventive services including:        Fall prevention   Diabetes screening  Nutrition counseling   Vaccines / LABS Zostavax / Pneumococcal Vaccine  today  PSA--sees urol for this  Patient Instructions (the written plan) was given to the patient.

## 2015-10-11 NOTE — Patient Instructions (Addendum)
Please come back for a follow-up appointment in 4 months.  Please stop taking the metoprolol.  I have sent a prescription to your pharmacy, for the pain patch.   Take calcium 1200 mg per day, and vitamin-D, 400 units per day.   Let's do the Reclast.  you will receive a phone call, about a day and time for an appointment.   good diet and exercise significantly improve your health.  please let me know if you wish to be referred to a dietician.  high blood sugar is very risky to your health.  you should see an eye doctor and dentist every year.  It is very important to get all recommended vaccinations.  Please consider these measures for your health:  minimize alcohol.  Do not use tobacco products.  Have a colonoscopy at least every 10 years from age 47. Keep firearms safely stored.  Always use seat belts.  have working smoke alarms in your home.  See an eye doctor and dentist regularly.  Never drive under the influence of alcohol or drugs (including prescription drugs).  Those with fair skin should take precautions against the sun, and should carefully examine their skin once per month, for any new or changed moles. It is critically important to prevent falling down (keep floor areas well-lit, dry, and free of loose objects.  If you have a cane, walker, or wheelchair, you should use it, even for short trips around the house.  Wear flat-soled shoes.  Also, try not to rush).

## 2015-10-17 ENCOUNTER — Ambulatory Visit (INDEPENDENT_AMBULATORY_CARE_PROVIDER_SITE_OTHER): Payer: Medicare Other | Admitting: Gastroenterology

## 2015-10-17 ENCOUNTER — Encounter: Payer: Self-pay | Admitting: Gastroenterology

## 2015-10-17 ENCOUNTER — Ambulatory Visit: Payer: Medicare Other | Admitting: Gastroenterology

## 2015-10-17 ENCOUNTER — Other Ambulatory Visit: Payer: Medicare Other

## 2015-10-17 VITALS — BP 104/64 | HR 64 | Ht 67.0 in | Wt 181.2 lb

## 2015-10-17 DIAGNOSIS — R1310 Dysphagia, unspecified: Secondary | ICD-10-CM | POA: Diagnosis not present

## 2015-10-17 DIAGNOSIS — R110 Nausea: Secondary | ICD-10-CM | POA: Diagnosis not present

## 2015-10-17 DIAGNOSIS — R197 Diarrhea, unspecified: Secondary | ICD-10-CM

## 2015-10-17 DIAGNOSIS — I639 Cerebral infarction, unspecified: Secondary | ICD-10-CM

## 2015-10-17 DIAGNOSIS — D132 Benign neoplasm of duodenum: Secondary | ICD-10-CM | POA: Diagnosis not present

## 2015-10-17 DIAGNOSIS — A09 Infectious gastroenteritis and colitis, unspecified: Secondary | ICD-10-CM | POA: Diagnosis not present

## 2015-10-17 MED ORDER — ONDANSETRON 4 MG PO TBDP: 4.0000 mg | ORAL_TABLET | Freq: Three times a day (TID) | ORAL | 0 refills | Status: DC | PRN

## 2015-10-17 NOTE — Patient Instructions (Signed)
Your physician has requested that you go to the basement for the following lab work before leaving today:  C diff   We have sent the following medications to your pharmacy for you to pick up at your convenience:  Generic Zofran  You have been scheduled for a Barium Esophogram at [facility] (1st floor of the hospital) on 10/30/2015 at 11:00am. Please arrive 15 minutes prior to your appointment for registration. Make certain not to have anything to eat or drink 4 hours prior to your test. If you need to reschedule for any reason, please contact radiology at [phone] to do so. __________________________________________________________________ A barium swallow is an examination that concentrates on views of the esophagus. This tends to be a double contrast exam (barium and two liquids which, when combined, create a gas to distend the wall of the oesophagus) or single contrast (non-ionic iodine based). The study is usually tailored to your symptoms so a good history is essential. Attention is paid during the study to the form, structure and configuration of the esophagus, looking for functional disorders (such as aspiration, dysphagia, achalasia, motility and reflux) EXAMINATION You may be asked to change into a gown, depending on the type of swallow being performed. A radiologist and radiographer will perform the procedure. The radiologist will advise you of the type of contrast selected for your procedure and direct you during the exam. You will be asked to stand, sit or lie in several different positions and to hold a small amount of fluid in your mouth before being asked to swallow while the imaging is performed .In some instances you may be asked to swallow barium coated marshmallows to assess the motility of a solid food bolus. The exam can be recorded as a digital or video fluoroscopy procedure. POST PROCEDURE It will take 1-2 days for the barium to pass through your system. To  facilitate this, it is important, unless otherwise directed, to increase your fluids for the next 24-48hrs and to resume your normal diet.  This test typically takes about 30 minutes to perform. __________________________________________________________________________________   Please follow up in 4 months

## 2015-10-17 NOTE — Progress Notes (Signed)
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  At the last visit he had complained of dysphagia and chronic globus sensation.  He had an EGD 04/23/15 - normal esophagus without pathology to cause dysphagia. Benign stomach polyps. 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 He was offered a manometry procedure and declined. He has a history of Myasthenia gravis and has some ongoing dysphagia. He also has a sense of globus, he has had a prior ENT evaluatoin which was negative. He has dysphagia about once or twice per week, and occurs with both solids and liquids. He feels it in the sternal notch. He denies any coughing or choking with this or nasal regurgitaiton. These symptoms are chronic.   The patient has also had an ischemic stroke on May 10th, had left cerebellar infarct. He had his coumadin changed to Eliquis. He also had a nuclear stress test 8/18 showing EF 50%, low risk stress test.   He is on Gleevac for history of CML. He reports some poor appetite and chronic nausea since being on this regimen, dealing with this for years. He has occasional nausea / vomiting. He has been on Gleevac since 2004. He takes nexium once daily and takes phenergan PRN. The phenergan makes him sleepy. He reported having Reglan in the past which made him feel a bit better remotely. He had a remote gastric emtying study in 2007 showing delayed gastric emptying.   He otherwise reports some loose stools today. This has been ongoing for a week or so at most. He has taken immodium once to twice daily which has helped. He will have several BMs per day. When he takes immodium it brings it down to 4-5 per day. No abdominal pains. No fevers. He denies any recent antibiotic  use. No blood in the stools. He is maintaining hydration okay.   Last colonoscopy 12/26/2013 - diverticulosis, otherwise normal  Past Medical History:  Diagnosis Date  . Carotid artery disease ([place])    a. Duplex 05/2014: 123456 RICA, 123456 LICA, elevated velocities in right subclavian artery, normal left subclavian artery..  . Chronic anticoagulation   . Chronic edema   . 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 Conjunctivitis unspecified   . Coronary atherosclerosis of unspecified type of vessel, native or graft    a. cath in 2003 showed 10-20% LM, scattered 20% prox-mid LAD, 30% more distal LAD, 50-60% stenosis of LAD towards apex, 20% Cx, 30% dRCA, focal 95% stenosis in smaller of 2 branches of PDA, EF 60-65%.  . Diabetes mellitus ([place])   . Diverticulosis of colon (without mention of hemorrhage)   . DJD (degenerative joint disease)   . DVT (deep venous thrombosis) [facility]) June 2013  . Dystonia   . Esophageal stricture   . Essential hypertension   . External hemorrhoids without mention of complication   . Gastroparesis   . GERD (gastroesophageal reflux disease)   . Glucose intolerance (impaired glucose tolerance)    history of  . History of kidney stones   . History of pneumonia   . HOH (hard of hearing)    hearing aids  . Hx of colonic polyps   . IBS (irritable bowel syndrome)   . Internal hemorrhoids without mention of  complication   . Mixed hyperlipidemia   . Myasthenia gravis ([place])    uses prednisone  . Osteoporosis 2016  . PONV (postoperative nausea and vomiting)   . Ptosis   . Pulmonary embolism [facility]) June 2013   a. Bilateral PE 07/2011.  Marland Kitchen Rash and other nonspecific skin eruption   . 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 Stroke ([place]) 06/2015  . Tubulovillous adenoma polyp of colon    2010     Past Surgical History:  Procedure Laterality Date  . ANGIOPLASTY  2001  . BACK SURGERY     x2, lumbar and cervical  . CARDIAC CATHETERIZATION    . CATARACT  EXTRACTION Bilateral 12/12  . ESOPHAGOGASTRODUODENOSCOPY ENDOSCOPY  04/2015  . LUMBAR LAMINECTOMY/DECOMPRESSION MICRODISCECTOMY Right 09/14/2012   Procedure: Right Lumbar three-four, four-five, Lumbar five-Sacral one decompressive laminectomy;  Surgeon: [name], MD;  Location: [place] [facility];  Service: Neurosurgery;  Laterality: Right;  . SHOULDER SURGERY Left 05/07/2009  . TONSILLECTOMY     Family History  Problem Relation Age of Onset  . Heart disease Mother   . Heart attack Mother   . Stroke Mother   . Heart disease Father   . Heart attack Father   . Stroke Father   . Breast cancer Sister     Twin   . Heart attack Sister   . Dementia Sister   . Heart disease Sister   . Heart attack Sister   . Clotting disorder Sister   . Heart attack Sister   . Colon cancer Neg Hx    Social History  Substance Use Topics  . Smoking status: Never Smoker  . Smokeless tobacco: Never Used  . Alcohol use No   Current Outpatient Prescriptions  Medication Sig Dispense Refill  . apixaban (ELIQUIS) 5 MG TABS tablet Take 1 tablet (5 mg total) by mouth 2 (two) times daily. 60 tablet 3  . brimonidine-timolol (COMBIGAN) 0.2-0.5 % ophthalmic solution Place 1 drop into both eyes every 12 (twelve) hours.    Marland Kitchen esomeprazole (NEXIUM) 40 MG capsule Take 40 mg by mouth daily at 12 noon.    . furosemide (LASIX) 20 MG tablet Take 20 mg by mouth daily.     Marland Kitchen imatinib (GLEEVEC) 100 MG tablet Take 2 tablets (200 mg total) by mouth 2 (two) times daily with a meal. Take with meals and large glass of water.Caution:Chemotherapy.    . isosorbide mononitrate (IMDUR) 30 MG 24 hr tablet Take 1 tablet (30 mg total) by mouth daily. 90 tablet 3  . lidocaine (LIDODERM) 5 % Place 1 patch onto the skin daily. Remove & Discard patch within 12 hours or as directed by MD 30 patch 11  . lisinopril (PRINIVIL,ZESTRIL) 5 MG tablet Take 1 tablet (5 mg total) by mouth daily. 30 tablet 1  . loperamide (IMODIUM) 2 MG capsule Take 2 mg by  mouth every morning. For diarrhea    . LORazepam (ATIVAN) 0.5 MG tablet Take 1 tablet (0.5 mg total) by mouth at bedtime. As needed for sleep 30 tablet 2  . Melatonin 3 MG TABS Take 1 tablet by mouth at bedtime.     . Multiple Vitamin (MULTIVITAMIN) tablet Take 1 tablet by mouth daily.    . nitroGLYCERIN (NITROSTAT) 0.4 MG SL tablet Place 1 tablet (0.4 mg total) under the tongue every 5 (five) minutes x 3 doses as needed for chest pain. 90 tablet 3  . [START ON 10/25/2015] predniSONE (DELTASONE) 5 MG tablet TAKE 1  TABLET (5 MG TOTAL) BY MOUTH DAILY. 90 tablet 3  . promethazine (PHENERGAN) 12.5 MG tablet Take 1 tablet (12.5 mg total) by mouth every 6 (six) hours as needed for nausea or vomiting. 30 tablet 0  . pyridostigmine (MESTINON) 60 MG tablet Take 1 tablet (60 mg total) by mouth 3 (three) times daily. 90 tablet 11  . rosuvastatin (CRESTOR) 10 MG tablet TAKE 1/2 TABLETS BY MOUTH DAILY. 45 tablet 0   No current facility-administered medications for this visit.    Allergies  Allergen Reactions  . Oxycodone-Acetaminophen Other (See Comments)    "too strong"  . Phenergan [Promethazine Hcl]     Eye swelling when combined with Zofran? Has taken it in the past without side effects  . Zofran [Ondansetron Hcl]     Reaction when given with phenergan?  . Sulfamethoxazole Rash and Itching     Review of Systems: All systems reviewed and negative except where noted in HPI.    Lab Results  Component Value Date   CREATININE 1.44 (H) 06/25/2015   BUN 10 06/25/2015   NA 140 06/25/2015   K 3.7 06/25/2015   CL 99 (L) 06/25/2015   CO2 28 06/25/2015   Lab Results  Component Value Date   ALT 28 06/25/2015   AST 32 06/25/2015   ALKPHOS 47 06/25/2015   BILITOT 0.8 06/25/2015      Physical Exam: BP 104/64   Pulse 64   Ht 5\' 7"  (1.702 m)   Wt 181 lb 3.2 oz (82.2 kg)   BMI 28.38 kg/m  Constitutional: Pleasant,male in no acute distress. HEENT: Normocephalic and atraumatic. Conjunctivae  are normal. No scleral icterus. Neck supple.  Cardiovascular: Normal rate, regular rhythm.  Pulmonary/chest: Effort normal and breath sounds normal. No wheezing, rales or rhonchi. Abdominal: Soft, nondistended, nontender. Bowel sounds active throughout. There are no masses palpable. No hepatomegaly. Extremities: no edema Lymphadenopathy: No cervical adenopathy noted. Neurological: Alert and oriented to person place and time. Skin: Skin is warm and dry. No rashes noted. Psychiatric: Normal mood and affect. Behavior is normal.   ASSESSMENT AND PLAN: 75 y/o male with PMH as outlined above, here for reassessment of the following issues:  Dysphagia / globus - longstanding symptoms, recent EGD without any clear stenosis / stricture, no inflammatory changes to suggest EoE. He has a history of myasthesia gravis, suspect he may have some dysmotility, while oropharyngeal pathology seems less likely. I discussed additional modalities to evaluate this to include esophageal manometry and barium study. He wished to proceed with least invasive modality of evaluation, and wished to have barium study with tablet. I will contact him with results.   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 No guidelines currently regarding surveillance interval for sporadic duodenal adenoma however recent literature recommends surveillance within 6 months for sporadic duodenal adenomas (World J Gastroenterol. 2016 Jan). I have discussed this with him. I do think his lesion was low risk, and he has recently had a CVA within the past 3 months and on Eliquis. We will hold off on surveillance exam for now and I will see him in clinic in 4 months to discuss this again once he is further out from his CVA.  Nausea / history of gastroparesis - longstanding symptoms since being on Gleevac per his report. Prior gastric emptying study was abnormal. I discussed Reglan with him and offered it to him,  and he declined for now given associated neurological risks. Will use Zofran  PRN as opposed to phenergan which makes him drowsi. If symptoms not improved and he wishes to try low dose Reglan he will contact me.   Acute diarrhea - within the past week, immodium helping. Suspect self limited infectious diarrhea, but will send C diff to ensure negative. He should maintain adequate hydration and will let him know results. If his condition fails to improve in the upcoming days or worsens he will contact me.     Goodwin Cellar, MD [facility] Pager [phone]  CC: Renato Shin, MD

## 2015-10-18 LAB — CLOSTRIDIUM DIFFICILE BY PCR: Toxigenic C. Difficile by PCR: NOT DETECTED

## 2015-10-21 ENCOUNTER — Encounter: Payer: Self-pay | Admitting: Gastroenterology

## 2015-10-22 DIAGNOSIS — I639 Cerebral infarction, unspecified: Secondary | ICD-10-CM | POA: Diagnosis not present

## 2015-10-22 DIAGNOSIS — Z7952 Long term (current) use of systemic steroids: Secondary | ICD-10-CM | POA: Diagnosis not present

## 2015-10-22 DIAGNOSIS — M810 Age-related osteoporosis without current pathological fracture: Secondary | ICD-10-CM | POA: Diagnosis not present

## 2015-10-22 DIAGNOSIS — K222 Esophageal obstruction: Secondary | ICD-10-CM | POA: Diagnosis not present

## 2015-10-22 DIAGNOSIS — K118 Other diseases of salivary glands: Secondary | ICD-10-CM | POA: Diagnosis not present

## 2015-10-22 DIAGNOSIS — G7000 Myasthenia gravis without (acute) exacerbation: Secondary | ICD-10-CM | POA: Diagnosis not present

## 2015-10-22 DIAGNOSIS — L570 Actinic keratosis: Secondary | ICD-10-CM | POA: Diagnosis not present

## 2015-10-22 DIAGNOSIS — C9211 Chronic myeloid leukemia, BCR/ABL-positive, in remission: Secondary | ICD-10-CM | POA: Diagnosis not present

## 2015-10-22 DIAGNOSIS — M25559 Pain in unspecified hip: Secondary | ICD-10-CM | POA: Diagnosis not present

## 2015-10-22 DIAGNOSIS — D539 Nutritional anemia, unspecified: Secondary | ICD-10-CM | POA: Diagnosis not present

## 2015-10-22 DIAGNOSIS — D709 Neutropenia, unspecified: Secondary | ICD-10-CM | POA: Diagnosis not present

## 2015-10-28 ENCOUNTER — Ambulatory Visit (HOSPITAL_COMMUNITY)
Admission: RE | Admit: 2015-10-28 | Discharge: 2015-10-28 | Disposition: A | Discharge status: Home / Self Care | Payer: Medicare Other | Source: Ambulatory Visit | Attending: Gastroenterology | Admitting: Gastroenterology

## 2015-10-28 DIAGNOSIS — R1310 Dysphagia, unspecified: Secondary | ICD-10-CM | POA: Diagnosis not present

## 2015-10-28 DIAGNOSIS — R110 Nausea: Secondary | ICD-10-CM | POA: Diagnosis not present

## 2015-10-28 DIAGNOSIS — K224 Dyskinesia of esophagus: Secondary | ICD-10-CM | POA: Diagnosis not present

## 2015-10-30 ENCOUNTER — Ambulatory Visit (HOSPITAL_COMMUNITY): Payer: Medicare Other

## 2015-11-05 ENCOUNTER — Other Ambulatory Visit: Payer: Self-pay | Admitting: Cardiovascular Disease

## 2015-11-05 ENCOUNTER — Other Ambulatory Visit: Payer: Self-pay

## 2015-11-05 MED ORDER — LISINOPRIL 5 MG PO TABS
5.0000 mg | ORAL_TABLET | Freq: Every day | ORAL | 11 refills | Status: DC
Start: 2015-11-05 — End: 2017-07-30

## 2015-11-19 ENCOUNTER — Other Ambulatory Visit: Payer: Self-pay | Admitting: Cardiology

## 2015-11-21 ENCOUNTER — Ambulatory Visit (INDEPENDENT_AMBULATORY_CARE_PROVIDER_SITE_OTHER): Payer: Medicare Other | Admitting: Neurology

## 2015-11-21 ENCOUNTER — Encounter: Payer: Self-pay | Admitting: Neurology

## 2015-11-21 VITALS — BP 135/77 | HR 51 | Ht 67.0 in | Wt 180.0 lb

## 2015-11-21 DIAGNOSIS — R269 Unspecified abnormalities of gait and mobility: Secondary | ICD-10-CM | POA: Diagnosis not present

## 2015-11-21 DIAGNOSIS — G7000 Myasthenia gravis without (acute) exacerbation: Secondary | ICD-10-CM | POA: Diagnosis not present

## 2015-11-21 DIAGNOSIS — I639 Cerebral infarction, unspecified: Secondary | ICD-10-CM

## 2015-11-21 DIAGNOSIS — I69393 Ataxia following cerebral infarction: Secondary | ICD-10-CM | POA: Diagnosis not present

## 2015-11-21 DIAGNOSIS — I69398 Other sequelae of cerebral infarction: Secondary | ICD-10-CM | POA: Diagnosis not present

## 2015-11-21 NOTE — Patient Instructions (Addendum)
Fall Prevention in the Home  Falls can cause injuries and can affect people from all age groups. There are many simple things that you can do to make your home safe and to help prevent falls. WHAT CAN I DO ON THE OUTSIDE OF MY HOME?  Regularly repair the edges of walkways and driveways and fix any cracks.  Remove high doorway thresholds.  Trim any shrubbery on the main path into your home.  Use bright outdoor lighting.  Clear walkways of debris and clutter, including tools and rocks.  Regularly check that handrails are securely fastened and in good repair. Both sides of any steps should have handrails.  Install guardrails along the edges of any raised decks or porches.  Have leaves, snow, and ice cleared regularly.  Use sand or salt on walkways during winter months.  In the garage, clean up any spills right away, including grease or oil spills. WHAT CAN I DO IN THE BATHROOM?  Use night lights.  Install grab bars by the toilet and in the tub and shower. Do not use towel bars as grab bars.  Use non-skid mats or decals on the floor of the tub or shower.  If you need to sit down while you are in the shower, use a plastic, non-slip stool..  Keep the floor dry. Immediately clean up any water that spills on the floor.  Remove soap buildup in the tub or shower on a regular basis.  Attach bath mats securely with double-sided non-slip rug tape.  Remove throw rugs and other tripping hazards from the floor. WHAT CAN I DO IN THE BEDROOM?  Use night lights.  Make sure that a bedside light is easy to reach.  Do not use oversized bedding that drapes onto the floor.  Have a firm chair that has side arms to use for getting dressed.  Remove throw rugs and other tripping hazards from the floor. WHAT CAN I DO IN THE KITCHEN?   Clean up any spills right away.  Avoid walking on wet floors.  Place frequently used items in easy-to-reach places.  If you need to reach for something  above you, use a sturdy step stool that has a grab bar.  Keep electrical cables out of the way.  Do not use floor polish or wax that makes floors slippery. If you have to use wax, make sure that it is non-skid floor wax.  Remove throw rugs and other tripping hazards from the floor. WHAT CAN I DO IN THE STAIRWAYS?  Do not leave any items on the stairs.  Make sure that there are handrails on both sides of the stairs. Fix handrails that are broken or loose. Make sure that handrails are as long as the stairways.  Check any carpeting to make sure that it is firmly attached to the stairs. Fix any carpet that is loose or worn.  Avoid having throw rugs at the top or bottom of stairways, or secure the rugs with carpet tape to prevent them from moving.  Make sure that you have a light switch at the top of the stairs and the bottom of the stairs. If you do not have them, have them installed. WHAT ARE SOME OTHER FALL PREVENTION TIPS?  Wear closed-toe shoes that fit well and support your feet. Wear shoes that have rubber soles or low heels.  When you use a stepladder, make sure that it is completely opened and that the sides are firmly locked. Have someone hold the ladder while you   are using it. Do not climb a closed stepladder.  Add color or contrast paint or tape to grab bars and handrails in your home. Place contrasting color strips on the first and last steps.  Use mobility aids as needed, such as canes, walkers, scooters, and crutches.  Turn on lights if it is dark. Replace any light bulbs that burn out.  Set up furniture so that there are clear paths. Keep the furniture in the same spot.  Fix any uneven floor surfaces.  Choose a carpet design that does not hide the edge of steps of a stairway.  Be aware of any and all pets.  Review your medicines with your healthcare provider. Some medicines can cause dizziness or changes in blood pressure, which increase your risk of falling. Talk  with your health care provider about other ways that you can decrease your risk of falls. This may include working with a physical therapist or trainer to improve your strength, balance, and endurance.   This information is not intended to replace advice given to you by your health care provider. Make sure you discuss any questions you have with your health care provider.   Document Released: 01/16/2002 Document Revised: 06/12/2014 Document Reviewed: 03/02/2014 Elsevier Interactive Patient Education 2016 [employer]  

## 2015-11-21 NOTE — Progress Notes (Signed)
Reason for visit: [name]  [name] is an 75 y.o. male  History of present illness:  [name] is a 75 year old right-handed white male with a history of [name]. The patient suffered a left cerebellar stroke around 06/19/2015. He has had some gait instability since that time, he reports no falls since last seen. The patient uses a cane at times with walking. He has developed some sharp pain in the right side the head just behind the ear that may come on 2 or 3 times a day lasting a minute or 2. The patient has noted no activating factors for the pain. He denies any other type of headache. The patient has done well with his Tristan, he reports no double vision or significant ptosis. He remains on low-dose prednisone 5 mg daily and he takes Mestinon. He returns this office for an evaluation.  Past Medical History:  Diagnosis Date  . Carotid artery disease ([name])    a. Duplex 05/2014: 123456 RICA, 123456 LICA, elevated velocities in right subclavian artery, normal left subclavian artery..  . Chronic anticoagulation   . Chronic edema   . CML (chronic myeloid leukemia) ([name])   . Conjunctivitis unspecified   . Coronary atherosclerosis of unspecified type of vessel, native or graft    a. cath in 2003 showed 10-20% LM, scattered 20% prox-mid LAD, 30% more distal LAD, 50-60% stenosis of LAD towards apex, 20% Cx, 30% dRCA, focal 95% stenosis in smaller of 2 branches of PDA, EF 60-65%.  . Diabetes mellitus ([name])   . Diverticulosis of colon (without mention of hemorrhage)   . DJD (degenerative joint disease)   . DVT (deep venous thrombosis) [name]) June 2013  . Dystonia   . Esophageal stricture   . Essential hypertension   . External hemorrhoids without mention of complication   . Gastroparesis   . GERD (gastroesophageal reflux disease)   . Glucose intolerance (impaired glucose tolerance)    history of  . History of kidney stones   . History of pneumonia   . HOH  (hard of hearing)    hearing aids  . Hx of colonic polyps   . IBS (irritable bowel syndrome)   . Internal hemorrhoids without mention of complication   . Mixed hyperlipidemia   . [name] ([name])    uses prednisone  . Osteoporosis 2016  . PONV (postoperative nausea and vomiting)   . Ptosis   . Pulmonary embolism [name]) June 2013   a. Bilateral PE 07/2011.  [name] Rash and other nonspecific skin eruption   . 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 Stroke ([name]) 06/2015  . Tubulovillous adenoma polyp of colon    2010    Past Surgical History:  Procedure Laterality Date  . ANGIOPLASTY  2001  . BACK SURGERY     x2, lumbar and cervical  . CARDIAC CATHETERIZATION    . CATARACT EXTRACTION Bilateral 12/12  . ESOPHAGOGASTRODUODENOSCOPY ENDOSCOPY  04/2015  . LUMBAR LAMINECTOMY/DECOMPRESSION MICRODISCECTOMY Right 09/14/2012   Procedure: Right Lumbar three-four, four-five, Lumbar five-Sacral one decompressive laminectomy;  Surgeon: [name], MD;  Location: [name];  Service: Neurosurgery;  Laterality: Right;  . SHOULDER SURGERY Left 05/07/2009  . TONSILLECTOMY      Family History  Problem Relation Age of Onset  . Heart disease Mother   . Heart attack Mother   . Stroke Mother   . Heart disease Father   . Heart attack Father   . Stroke  Father   . Breast cancer Sister     Twin   . Heart attack Sister   . Dementia Sister   . Heart disease Sister   . Heart attack Sister   . Clotting disorder Sister   . Heart attack Sister   . Colon cancer Neg Hx     Social history:  reports that he has never smoked. He has never used smokeless tobacco. He reports that he does not drink alcohol or use drugs.    Allergies  Allergen Reactions  . Oxycodone-Acetaminophen Other (See Comments)    "too strong"  . Phenergan [Promethazine Hcl]     Eye swelling when combined with Zofran? Has taken it in the past without side effects  . Zofran [Ondansetron Hcl]     Reaction when given  with phenergan?  . Sulfamethoxazole Rash and Itching    Medications:  Prior to Admission medications   Medication Sig Start Date End Date Taking? Authorizing Provider  apixaban (ELIQUIS) 5 MG TABS tablet Take 1 tablet (5 mg total) by mouth 2 (two) times daily. 07/29/15  Yes Kathrynn Ducking, MD  brimonidine-timolol (COMBIGAN) 0.2-0.5 % ophthalmic solution Place 1 drop into both eyes every 12 (twelve) hours.   Yes Historical Provider, MD  esomeprazole (NEXIUM) 40 MG capsule Take 40 mg by mouth daily.    Yes Historical Provider, MD  furosemide (LASIX) 20 MG tablet Take 20 mg by mouth daily.  03/28/15  Yes Historical Provider, MD  imatinib (GLEEVEC) 100 MG tablet Take 2 tablets (200 mg total) by mouth 2 (two) times daily with a meal. Take with meals and large glass of water.Caution:Chemotherapy. 06/27/15  Yes Ivan Anchors Love, PA-C  isosorbide mononitrate (IMDUR) 30 MG 24 hr tablet Take 1 tablet (30 mg total) by mouth daily. 09/17/15  Yes Sherren Mocha, MD  lidocaine (LIDODERM) 5 % Place 1 patch onto the skin daily. Remove & Discard patch within 12 hours or as directed by MD 10/11/15  Yes Renato Shin, MD  lisinopril (PRINIVIL,ZESTRIL) 5 MG tablet Take 1 tablet (5 mg total) by mouth daily. 11/05/15  Yes Sherren Mocha, MD  loperamide (IMODIUM) 2 MG capsule Take 2 mg by mouth every morning. For diarrhea   Yes Historical Provider, MD  LORazepam (ATIVAN) 0.5 MG tablet Take 1 tablet (0.5 mg total) by mouth at bedtime. As needed for sleep 07/11/15  Yes Renato Shin, MD  Melatonin 3 MG TABS Take 1 tablet by mouth at bedtime.    Yes Historical Provider, MD  Multiple Vitamin (MULTIVITAMIN) tablet Take 1 tablet by mouth daily.   Yes Historical Provider, MD  NITROSTAT 0.4 MG SL tablet PLACE 1 TABLET UNDER THE TONGUE EVERY 5 MINUTES X3 DOSES AS NEEDED FOR CHEST PAIN 11/19/15  Yes Sherren Mocha, MD  ondansetron (ZOFRAN ODT) 4 MG disintegrating tablet Take 1-2 tablets (4-8 mg total) by mouth every 8 (eight) hours as needed  for nausea or vomiting. 10/17/15  Yes Manus Gunning, MD  predniSONE (DELTASONE) 5 MG tablet TAKE 1 TABLET (5 MG TOTAL) BY MOUTH DAILY. 10/25/15  Yes Kathrynn Ducking, MD  promethazine (PHENERGAN) 12.5 MG tablet Take 1 tablet (12.5 mg total) by mouth every 6 (six) hours as needed for nausea or vomiting. 06/28/15  Yes Ivan Anchors Love, PA-C  pyridostigmine (MESTINON) 60 MG tablet Take 1 tablet (60 mg total) by mouth 3 (three) times daily. 09/03/15  Yes Kathrynn Ducking, MD  rosuvastatin (CRESTOR) 10 MG tablet TAKE 1/2 TABLETS BY MOUTH DAILY.  09/09/15  Yes Sherren Mocha, MD    ROS:  Out of a complete 14 system review of symptoms, the patient complains only of the following symptoms, and all other reviewed systems are negative.  Hearing loss Back pain  Blood pressure 135/77, pulse (!) 51, height 5\' 7"  (1.702 m), weight 180 lb (81.6 kg).  Physical Exam  General: The patient is alert and cooperative at the time of the examination. The patient is moderately obese.  Neuromuscular: The patient lacks about 15 of full lateral rotation of the cervical spine bilaterally.  Skin: No significant peripheral edema is noted.   Neurologic Exam  Mental status: The patient is alert and oriented x 3 at the time of the examination. The patient has apparent normal recent and remote memory, with an apparently normal attention span and concentration ability.   Cranial nerves: Facial symmetry is present. Speech is normal, no aphasia or dysarthria is noted. Extraocular movements are full. Visual fields are full.  Motor: The patient has good strength in all 4 extremities.  Sensory examination: Soft touch sensation is symmetric on the face, arms, and legs.  Coordination: The patient has good finger-nose-finger and heel-to-shin bilaterally.  Gait and station: The patient has a normal gait. Tandem gait is unsteady. Romberg is negative, but is unsteady. No drift is seen.  Reflexes: Deep tendon reflexes are  symmetric.   Assessment/Plan:  1. [name]  2. Left cerebellar stroke  3. Gait disorder  The patient will continue the Mestinon and prednisone. The patient is being careful with his walking. He is now complaining of some intermittent sharp pain behind the right ear, the etiology is not clear. 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 The patient otherwise follow-up in 6 months.  Jill Alexanders MD 11/21/2015 10:56 AM  [facility] [address] [place], [place] [address]  Phone [phone] Fax [phone]

## 2015-12-09 ENCOUNTER — Other Ambulatory Visit: Payer: Self-pay | Admitting: Neurology

## 2015-12-22 ENCOUNTER — Other Ambulatory Visit: Payer: Self-pay | Admitting: Cardiovascular Disease

## 2015-12-22 DIAGNOSIS — E782 Mixed hyperlipidemia: Secondary | ICD-10-CM

## 2015-12-22 DIAGNOSIS — I10 Essential (primary) hypertension: Secondary | ICD-10-CM

## 2015-12-22 DIAGNOSIS — I2510 Atherosclerotic heart disease of native coronary artery without angina pectoris: Secondary | ICD-10-CM

## 2015-12-26 ENCOUNTER — Telehealth: Payer: Self-pay | Admitting: Gastroenterology

## 2015-12-26 ENCOUNTER — Telehealth: Payer: Self-pay | Admitting: Endocrinology

## 2015-12-26 DIAGNOSIS — N183 Chronic kidney disease, stage 3 unspecified: Secondary | ICD-10-CM

## 2015-12-26 DIAGNOSIS — H401131 Primary open-angle glaucoma, bilateral, mild stage: Secondary | ICD-10-CM | POA: Diagnosis not present

## 2015-12-26 NOTE — Telephone Encounter (Signed)
Patient is call ing on the status of reclast,  And need a refill of one touch ultra test strips.   [place], [place] [phone] (Phone) [phone] (Fax)

## 2015-12-26 NOTE — Telephone Encounter (Signed)
He has had these symptoms longstanding since being on Gleevac, which we discussed at the last visit. I previously offered him a low dose Reglan to see if this helps and he declined. It does have rare risk of neurologic side effects, but usually at doses 30mg  or greater per day. We can try him on 5mg  prior to meals, 3 times per day, to see if this helps. He does not need to keep clinic appointment if he wishes to try this first, and can reschedule a follow up with me otherwise. If he wishes to discuss further at his clinic appointment that is fine. Thanks

## 2015-12-26 NOTE — Telephone Encounter (Signed)
Spoke to patient's wife, he has had no issues with swallowing as described at last ov. Patient's wife states he is able to eat breakfast and lunch and then is too "bloated, full feeling" after that to eat anything else for the rest of the day. Patient is taking Nexium once daily and phenergan. Patient also taking his Gleevec medication daily. They do have an appointment with APP on 11/22, but wondered if there is anything else that he can take in the mean time. Wife stated he had been offered Reglan but had declined, would now consider taking it, if applicable.

## 2015-12-26 NOTE — Telephone Encounter (Signed)
Spoke to patient and he is not sure if he wants to start on the Reglan. He would like to keep the appointment for next week and if it is determined that he still needs it, will start it at that time.

## 2015-12-27 MED ORDER — GLUCOSE BLOOD VI STRP: ORAL_STRIP | 12 refills | Status: DC

## 2015-12-27 MED ORDER — ONETOUCH ULTRASOFT LANCETS MISC: 12 refills | Status: DC

## 2015-12-27 NOTE — Telephone Encounter (Signed)
Please call in test strips for pt the one touch ultra to [facility] thank you

## 2015-12-27 NOTE — Telephone Encounter (Signed)
Refill for test strips submitted.  Vaughan Basta, could you contact the patient to schedule the reclast? Thanks!

## 2015-12-31 ENCOUNTER — Encounter: Payer: Self-pay | Admitting: Endocrinology

## 2016-01-01 ENCOUNTER — Ambulatory Visit (INDEPENDENT_AMBULATORY_CARE_PROVIDER_SITE_OTHER): Payer: Medicare Other | Admitting: Physician Assistant

## 2016-01-01 ENCOUNTER — Encounter: Payer: Self-pay | Admitting: Physician Assistant

## 2016-01-01 VITALS — BP 110/60 | HR 64 | Ht 67.0 in | Wt 181.8 lb

## 2016-01-01 DIAGNOSIS — R140 Abdominal distension (gaseous): Secondary | ICD-10-CM | POA: Diagnosis not present

## 2016-01-01 DIAGNOSIS — D132 Benign neoplasm of duodenum: Secondary | ICD-10-CM

## 2016-01-01 DIAGNOSIS — R1013 Epigastric pain: Secondary | ICD-10-CM | POA: Diagnosis not present

## 2016-01-01 DIAGNOSIS — I639 Cerebral infarction, unspecified: Secondary | ICD-10-CM | POA: Diagnosis not present

## 2016-01-01 DIAGNOSIS — R1314 Dysphagia, pharyngoesophageal phase: Secondary | ICD-10-CM | POA: Diagnosis not present

## 2016-01-01 MED ORDER — ERYTHROMYCIN BASE 250 MG PO TABS: 250.0000 mg | ORAL_TABLET | Freq: Four times a day (QID) | ORAL | 1 refills | Status: DC

## 2016-01-01 NOTE — Patient Instructions (Signed)
We have given you a handout on gastroparesis.  We have sent the following medications to your pharmacy for you to pick up at your convenience: Erythromycin 250 mg four times a day with meals and at bedtime.

## 2016-01-01 NOTE — Progress Notes (Signed)
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     Patient was last seen by Dr. Havery Moros 10/17/2015. At that visit, the patient was noted to have a remote gastric emptying study in 2007 which showed delayed gastric emptying, he did describe dysphagia symptoms. He also reported some loose stools at that time. At that time patient had a recent EGD without any clear stenosis/stricture with no inflammatory changes to suggest EoE. He had a history of myasthenia gravis and was suspected to have some dysmotility. At that time esophageal manometry and barium study were discussed. The patient wished to proceed with the barium study. Patient also had long-standing symptoms of nausea and history of gastroparesis. He declined Reglan at last visit. Zofran was given when necessary.   Patient had esophagogram completed 10/28/2015 which showed mild nonspecific esophageal motility disorder with no fixed stricture.   Today, the patient returns to clinic accompanied by his wife. He tells me that all of his symptoms continue. He does continue with a feeling of dysphagia and is aware that his swallow studies were "essentially normal". He tells me this is "not really bothering me today".   Patient also continues to describe dyspepsia, noting that he believes a lot of his nausea and vomiting symptoms come from his chemotherapy, but also he describes that throughout the day he will feel bloated and full, typically after eating a meal. He does try to eat small meals throughout the day as he has been told to do this previously. Though, sometimes he knows he will vomit he just doesn't know  when it will come on. Once he vomits, mostly food material, he feels better. The patient describes he continues his Zofran for nausea and his Esmeprazole 40 mg daily for reflux. He denies any constipation symptoms and in fact tells me that 60-70% of the time his stools are mostly loose. The patient asked me today what else he can do for his bloating and fullness. He tells me he has history of being on Reglan in the past prescribed by Dr. Deatra Ina, though remembers taking only about 2 of these doses as he was and is "very scared of the side effects". He knows this is likely not long enough to see benefit from this medicine.   Patient denies fever, chills, blood in stool, melena, fatigue, anorexia, pain.  Past Medical History:  Diagnosis Date  . Carotid artery disease ([place])    a. Duplex 05/2014: 123456 RICA, 123456 LICA, elevated velocities in right subclavian artery, normal left subclavian artery..  . Chronic anticoagulation   . Chronic edema   . CML (chronic myeloid leukemia) ([place])   . Conjunctivitis unspecified   . Coronary atherosclerosis of unspecified type of vessel, native or graft    a. cath in 2003 showed 10-20% LM, scattered 20% prox-mid LAD, 30% more distal LAD, 50-60% stenosis of LAD towards apex, 20% Cx, 30% dRCA, focal 95% stenosis in smaller of 2 branches of PDA, EF 60-65%.  . Diabetes mellitus ([place])   . Diverticulosis of colon (without mention of hemorrhage)   . DJD (degenerative joint disease)   . DVT (deep venous thrombosis) [facility]) June 2013  . Dystonia   .  Esophageal stricture   . Essential hypertension   . External hemorrhoids without mention of complication   . Gastroparesis   . GERD (gastroesophageal reflux disease)   . Glucose intolerance (impaired glucose tolerance)    history of  . History of kidney stones   . History of pneumonia   . HOH (hard of hearing)    hearing aids  . Hx of colonic polyps   . IBS (irritable bowel syndrome)   . Internal hemorrhoids without  mention of complication   . Mixed hyperlipidemia   . Myasthenia gravis ([place])    uses prednisone  . Osteoporosis 2016  . PONV (postoperative nausea and vomiting)   . Ptosis   . Pulmonary embolism [facility]) June 2013   a. Bilateral PE 07/2011.  Marland Kitchen Rash and other nonspecific skin eruption   . 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 Stroke ([place]) 06/2015  . Tubulovillous adenoma polyp of colon    2010    Past Surgical History:  Procedure Laterality Date  . ANGIOPLASTY  2001  . BACK SURGERY     x2, lumbar and cervical  . CARDIAC CATHETERIZATION    . CATARACT EXTRACTION Bilateral 12/12  . ESOPHAGOGASTRODUODENOSCOPY ENDOSCOPY  04/2015  . LUMBAR LAMINECTOMY/DECOMPRESSION MICRODISCECTOMY Right 09/14/2012   Procedure: Right Lumbar three-four, four-five, Lumbar five-Sacral one decompressive laminectomy;  Surgeon: [name], MD;  Location: [place] [facility];  Service: Neurosurgery;  Laterality: Right;  . SHOULDER SURGERY Left 05/07/2009  . TONSILLECTOMY      Current Outpatient Prescriptions  Medication Sig Dispense Refill  . brimonidine-timolol (COMBIGAN) 0.2-0.5 % ophthalmic solution Place 1 drop into both eyes every 12 (twelve) hours.    Marland Kitchen ELIQUIS 5 MG TABS tablet TAKE 1 TABLET (5 MG TOTAL) BY MOUTH 2 (TWO) TIMES DAILY. 60 tablet 3  . esomeprazole (NEXIUM) 40 MG capsule Take 40 mg by mouth daily.     . furosemide (LASIX) 20 MG tablet TAKE 1 TABLET BY MOUTH DAILY. 90 tablet 2  . glucose blood (ONE TOUCH TEST STRIPS) test strip Use to check blood sugar 1 time per day. 100 each 12  . imatinib (GLEEVEC) 100 MG tablet Take 2 tablets (200 mg total) by mouth 2 (two) times daily with a meal. Take with meals and large glass of water.Caution:Chemotherapy.    . isosorbide mononitrate (IMDUR) 30 MG 24 hr tablet Take 1 tablet (30 mg total) by mouth daily. 90 tablet 3  . Lancets (ONETOUCH ULTRASOFT) lancets Use to check blood sugar 1 time per day. 100 each 12  . lidocaine (LIDODERM) 5 % Place 1 patch onto  the skin daily. Remove & Discard patch within 12 hours or as directed by MD 30 patch 11  . lisinopril (PRINIVIL,ZESTRIL) 5 MG tablet Take 1 tablet (5 mg total) by mouth daily. 30 tablet 11  . loperamide (IMODIUM) 2 MG capsule Take 2 mg by mouth every morning. For diarrhea    . LORazepam (ATIVAN) 0.5 MG tablet Take 1 tablet (0.5 mg total) by mouth at bedtime. As needed for sleep 30 tablet 2  . Melatonin 3 MG TABS Take 1 tablet by mouth at bedtime.     . Multiple Vitamin (MULTIVITAMIN) tablet Take 1 tablet by mouth daily.    Marland Kitchen NITROSTAT 0.4 MG SL tablet PLACE 1 TABLET UNDER THE TONGUE EVERY 5 MINUTES X3 DOSES AS NEEDED FOR CHEST PAIN 100 tablet 1  . ondansetron (ZOFRAN ODT) 4 MG disintegrating tablet Take 1-2 tablets (4-8 mg total) by mouth every  8 (eight) hours as needed for nausea or vomiting. 20 tablet 0  . predniSONE (DELTASONE) 5 MG tablet TAKE 1 TABLET (5 MG TOTAL) BY MOUTH DAILY. 90 tablet 3  . promethazine (PHENERGAN) 12.5 MG tablet Take 1 tablet (12.5 mg total) by mouth every 6 (six) hours as needed for nausea or vomiting. 30 tablet 0  . pyridostigmine (MESTINON) 60 MG tablet Take 1 tablet (60 mg total) by mouth 3 (three) times daily. 90 tablet 11  . rosuvastatin (CRESTOR) 10 MG tablet TAKE 1/2 TABLETS BY MOUTH DAILY. 45 tablet 0   No current facility-administered medications for this visit.     Allergies as of 01/01/2016 - Review Complete 01/01/2016  Allergen Reaction Noted  . Oxycodone-acetaminophen Other (See Comments) 04/24/2013  . Phenergan [promethazine hcl]  06/20/2015  . Zofran [ondansetron hcl]  06/28/2015  . Sulfamethoxazole Rash and Itching 07/12/2006    Family History  Problem Relation Age of Onset  . Heart disease Mother   . Heart attack Mother   . Stroke Mother   . Heart disease Father   . Heart attack Father   . Stroke Father   . Breast cancer Sister     Twin   . Heart attack Sister   . Dementia Sister   . Heart disease Sister   . Heart attack Sister   .  Clotting disorder Sister   . Heart attack Sister   . Colon cancer Neg Hx     Social History   Social History  . Marital status: Married    Spouse name: Hassan Rowan  . Number of children: 2  . Years of education: 1-College   Occupational History  . retired Retired    Retired   Social History Main Topics  . Smoking status: Never Smoker  . Smokeless tobacco: Never Used  . Alcohol use No  . Drug use: No  . Sexual activity: Not Currently   Other Topics Concern  . Not on file   Social History Narrative   Lives at home w/ his wife   Patient is right handed.   Patient has 1-2 cups of caffeine daily.    Review of Systems:     Constitutional: No weight loss, fever, chills, weakness or fatigue Cardiovascular: No chest pain Respiratory: No SOB  Gastrointestinal: See HPI and otherwise negative   Physical Exam:  Vital signs: BP 110/60   Pulse 64   Ht 5\' 7"  (1.702 m)   Wt 181 lb 12.8 oz (82.5 kg)   BMI 28.47 kg/m    Constitutional:   Pleasant Caucasian male appears to be in NAD, Well developed, Well nourished, alert and cooperative Respiratory: Respirations even and unlabored. Lungs clear to auscultation bilaterally.   No wheezes, crackles, or rhonchi.  Cardiovascular: Normal S1, S2. No MRG. Regular rate and rhythm. No peripheral edema, cyanosis or pallor.  Gastrointestinal:  Soft, mild distension, nontender. No rebound or guarding. Normal bowel sounds. No appreciable masses or hepatomegaly. Msk:  Symmetrical without gross deformities. Without edema, no deformity or joint abnormality.   MOST RECENT LABS AND IMAGING: CBC    Component Value Date/Time   WBC 6.6 06/25/2015 0723   RBC 3.95 (L) 06/25/2015 0723   HGB 12.3 (L) 06/25/2015 0723   HGB 12.4 (L) 11/21/2012 1008   HCT 37.3 (L) 06/25/2015 0723   HCT 35.8 (L) 11/21/2012 1008   PLT 220 06/25/2015 0723   PLT 231 11/21/2012 1008   MCV 94.4 06/25/2015 0723   MCV 90.7 11/21/2012 1008  MCH 31.1 06/25/2015 0723   MCHC 33.0  06/25/2015 0723   RDW 14.3 06/25/2015 0723   RDW 13.7 11/21/2012 1008   LYMPHSABS 1.5 06/25/2015 0723   LYMPHSABS 1.1 11/21/2012 1008   MONOABS 0.9 06/25/2015 0723   MONOABS 0.6 11/21/2012 1008   EOSABS 0.3 06/25/2015 0723   EOSABS 0.1 11/21/2012 1008   BASOSABS 0.0 06/25/2015 0723   BASOSABS 0.0 11/21/2012 1008    CMP     Component Value Date/Time   NA 140 06/25/2015 0723   NA 143 01/02/2014 1311   NA 141 02/26/2012 0852   K 3.7 06/25/2015 0723   K 3.4 (L) 02/26/2012 0852   CL 99 (L) 06/25/2015 0723   CL 105 02/26/2012 0852   CO2 28 06/25/2015 0723   CO2 28 02/26/2012 0852   GLUCOSE 94 06/25/2015 0723   GLUCOSE 77 02/26/2012 0852   BUN 10 06/25/2015 0723   BUN 11 01/02/2014 1311   BUN 13.0 02/26/2012 0852   CREATININE 1.44 (H) 06/25/2015 0723   CREATININE 1.33 05/11/2013 1246   CREATININE 1.3 02/26/2012 0852   CALCIUM 9.1 06/25/2015 0723   CALCIUM 9.1 02/26/2012 0852   PROT 5.8 (L) 06/25/2015 0723   PROT 5.5 (L) 01/02/2014 1311   PROT 6.1 (L) 02/26/2012 0852   ALBUMIN 3.4 (L) 06/25/2015 0723   ALBUMIN 4.0 01/02/2014 1311   ALBUMIN 3.6 02/26/2012 0852   AST 32 06/25/2015 0723   AST 26 02/26/2012 0852   ALT 28 06/25/2015 0723   ALT 37 02/26/2012 0852   ALKPHOS 47 06/25/2015 0723   ALKPHOS 53 02/26/2012 0852   BILITOT 0.8 06/25/2015 0723   BILITOT 0.75 02/26/2012 0852   GFRNONAA 45 (L) 06/25/2015 0723   GFRAA 53 (L) 06/25/2015 0723   ESOPHOGRAM / BARIUM SWALLOW / BARIUM TABLET STUDY 10/28/15  TECHNIQUE: Combined double contrast and single contrast examination performed using effervescent crystals, thick barium liquid, and thin barium liquid. The patient was observed with fluoroscopy swallowing a 13 mm barium sulphate tablet.  FLUOROSCOPY TIME:  Fluoroscopy Time:  1.8 minutes  Radiation Exposure Index (if provided by the fluoroscopic device): 10.10 mGy  Number of Acquired Spot Images: 0  COMPARISON:  None.  FINDINGS: Fluoroscopic evaluation of  swallowing demonstrates disruption of 2 out of 3 primary esophageal peristaltic waves with proximal escape. No fixed stricture, fold thickening or mass. No laryngeal penetration or aspiration. No reflux with the water siphon maneuver. The patient swallowed a 13 mm barium tablet which freely passed into the stomach.  IMPRESSION: Mild nonspecific esophageal motility disorder.  No fixed stricture.   Electronically Signed   By: Rolm Baptise M.D.   On: 10/28/2015 14:04  Assessment: 1. Dyspepsia: Again thought likely due to his current chemotherapy, though also prior gastric emptying study was abnormal, so this is likely contributing, patient's primary complaint today is of bloating and dyspepsia, regardless of eating 3 small meals per day 2. Dysphagia: Patient tells me this is "not really bothering him right now", he did have recent swallow study which showed impaired motility, esophageal manometry has been discussed in the past, though Dr. 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 Havery Moros previously was for repeat EGD in January of next year as the patient had recently had a CVA and is on Eliquis 4. Bloating/early satiety: Likely related to gastroparesis as above  Plan: 1. Today patient was given a trial prescription of  Erythromycin base 250 mg to be used 30 minutes to an hour before each meal of the day and at bedtime, discussed that typically we only use short courses of this medicine to see if it will help increase his gastric motility. He will remain on this for 4-6 weeks and then come off. If this helps, then possibly we can use this again in the future. 2. Had much discussion about Reglan today, patient still remains somewhat afraid of the side effects. He did tell me that if the erythromycin doesn't work and he continues with his symptoms that disturb his life he may try  the Reglan in the future. 3. Discussed possible esophageal manometry, relayed that Dr. Havery Moros did not feel that this would be very helpful to his dysphagia symptoms, the patient tells me this symptom is not bothering him that much today and he will wait to pursue this. 4. Again patient will need a repeat EGD in the future, as per Dr. Havery Moros at last visit this would be around January for further evaluation/surveillance of duodenal adenoma seen at time of last EGD. 5. Continue supportive medications including Esomeprazole and Zofran 6. Gastroparesis handout was given to the patient regarding eating small meals throughout the day and low residue diet 7. Patient to follow in clinic in 3-4 weeks with me or Dr. Havery Moros.  Ellouise Newer, PA-C [facility] 01/01/2016, 8:36 AM  Cc: Renato Shin, MD

## 2016-01-05 NOTE — Progress Notes (Signed)
Agree with assessment and plan. If symptoms persist despite erythromycin we can discuss a trial of Reglan with him again, or consider domperidone.

## 2016-01-06 ENCOUNTER — Other Ambulatory Visit: Payer: Self-pay

## 2016-01-06 MED ORDER — GLUCOSE BLOOD VI STRP: ORAL_STRIP | 2 refills | Status: DC

## 2016-01-06 MED ORDER — ACCU-CHEK MULTICLIX LANCETS MISC: 2 refills | Status: DC

## 2016-01-06 MED ORDER — ACCU-CHEK AVIVA PLUS W/DEVICE KIT: PACK | 0 refills | Status: DC

## 2016-01-08 ENCOUNTER — Other Ambulatory Visit: Payer: Self-pay

## 2016-01-08 DIAGNOSIS — J0100 Acute maxillary sinusitis, unspecified: Secondary | ICD-10-CM | POA: Diagnosis not present

## 2016-01-08 DIAGNOSIS — J209 Acute bronchitis, unspecified: Secondary | ICD-10-CM | POA: Diagnosis not present

## 2016-01-08 MED ORDER — ACCU-CHEK MULTICLIX LANCETS MISC: 2 refills | Status: DC

## 2016-01-08 MED ORDER — GLUCOSE BLOOD VI STRP: ORAL_STRIP | 2 refills | Status: DC

## 2016-01-08 MED ORDER — ACCU-CHEK AVIVA PLUS W/DEVICE KIT: PACK | 0 refills | Status: DC

## 2016-01-10 ENCOUNTER — Ambulatory Visit (INDEPENDENT_AMBULATORY_CARE_PROVIDER_SITE_OTHER): Payer: Medicare Other | Admitting: Endocrinology

## 2016-01-10 ENCOUNTER — Ambulatory Visit
Admission: RE | Admit: 2016-01-10 | Discharge: 2016-01-10 | Disposition: A | Discharge status: Home / Self Care | Payer: Medicare Other | Source: Ambulatory Visit | Attending: Endocrinology | Admitting: Endocrinology

## 2016-01-10 DIAGNOSIS — R059 Cough, unspecified: Secondary | ICD-10-CM | POA: Insufficient documentation

## 2016-01-10 DIAGNOSIS — I639 Cerebral infarction, unspecified: Secondary | ICD-10-CM | POA: Diagnosis not present

## 2016-01-10 DIAGNOSIS — R05 Cough: Secondary | ICD-10-CM

## 2016-01-10 DIAGNOSIS — R0602 Shortness of breath: Secondary | ICD-10-CM | POA: Diagnosis not present

## 2016-01-10 MED ORDER — FLUTICASONE-SALMETEROL 100-50 MCG/DOSE IN AEPB: 1.0000 | INHALATION_SPRAY | Freq: Two times a day (BID) | RESPIRATORY_TRACT | 3 refills | Status: DC

## 2016-01-10 MED ORDER — PROMETHAZINE-CODEINE 6.25-10 MG/5ML PO SYRP: 5.0000 mL | ORAL_SOLUTION | ORAL | 0 refills | Status: DC | PRN

## 2016-01-10 MED ORDER — CEFUROXIME AXETIL 500 MG PO TABS: 500.0000 mg | ORAL_TABLET | Freq: Two times a day (BID) | ORAL | 0 refills | Status: DC

## 2016-01-10 NOTE — Patient Instructions (Addendum)
Here is a prescription for a different type of cough syrup. I have sent 2 prescriptions to your pharmacy: for a different antibiotic, and inhaler. A chest x-ray is requested for you today.  We'll let you know about the results.  Loratadine-d (non-prescription) will help your congestion.   I hope you feel better soon.  If you don't feel better by next week, please call back.  Please call sooner if you get worse.

## 2016-01-10 NOTE — Progress Notes (Signed)
Subjective:    Patient ID: [name], male    DOB: [birth year], 75 y.o.   MRN: [id number]  HPI Pt was seen at [facility] for URI.  He was rx'ed amoxil, prednisone, and tussionex.  He has 10 days of moderate prod-quality cough in the chest, and assoc wheezing.  He says the tussionex causes nausea.   Past Medical History:  Diagnosis Date  . Carotid artery disease ([place])    a. Duplex 05/2014: 67-67% RICA, 2-09% LICA, elevated velocities in right subclavian artery, normal left subclavian artery..  . Chronic anticoagulation   . Chronic edema   . CML (chronic myeloid leukemia) ([place])   . Conjunctivitis unspecified   . Coronary atherosclerosis of unspecified type of vessel, native or graft    a. cath in 2003 showed 10-20% LM, scattered 20% prox-mid LAD, 30% more distal LAD, 50-60% stenosis of LAD towards apex, 20% Cx, 30% dRCA, focal 95% stenosis in smaller of 2 branches of PDA, EF 60-65%.  . Diabetes mellitus ([place])   . Diverticulosis of colon (without mention of hemorrhage)   . DJD (degenerative joint disease)   . DVT (deep venous thrombosis) [facility]) June 2013  . Dystonia   . Esophageal stricture   . Essential hypertension   . External hemorrhoids without mention of complication   . Gastroparesis   . GERD (gastroesophageal reflux disease)   . Glucose intolerance (impaired glucose tolerance)    history of  . History of kidney stones   . History of pneumonia   . HOH (hard of hearing)    hearing aids  . Hx of colonic polyps   . IBS (irritable bowel syndrome)   . Internal hemorrhoids without mention of complication   . Mixed hyperlipidemia   . Myasthenia gravis ([place])    uses prednisone  . Osteoporosis 2016  . PONV (postoperative nausea and vomiting)   . Ptosis   . Pulmonary embolism [facility]) June 2013   a. Bilateral PE 07/2011.  Marland Kitchen Rash and other nonspecific skin eruption   . 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 Stroke ([place]) 06/2015  . Tubulovillous adenoma polyp of colon    2010     Past Surgical History:  Procedure Laterality Date  . ANGIOPLASTY  2001  . BACK SURGERY     x2, lumbar and cervical  . CARDIAC CATHETERIZATION    . CATARACT EXTRACTION Bilateral 12/12  . ESOPHAGOGASTRODUODENOSCOPY ENDOSCOPY  04/2015  . LUMBAR LAMINECTOMY/DECOMPRESSION MICRODISCECTOMY Right 09/14/2012   Procedure: Right Lumbar three-four, four-five, Lumbar five-Sacral one decompressive laminectomy;  Surgeon: [name], MD;  Location: [place] [facility];  Service: Neurosurgery;  Laterality: Right;  . SHOULDER SURGERY Left 05/07/2009  . TONSILLECTOMY      Social History   Social History  . Marital status: Married    Spouse name: Hassan Rowan  . Number of children: 2  . Years of education: 1-College   Occupational History  . retired Retired    Retired   Social History Main Topics  . Smoking status: Never Smoker  . Smokeless tobacco: Never Used  . Alcohol use No  . Drug use: No  . Sexual activity: Not Currently   Other Topics Concern  . Not on file   Social History Narrative   Lives at home w/ his wife   Patient is right handed.   Patient has 1-2 cups of caffeine daily.    Current Outpatient Prescriptions on File Prior to Visit  Medication Sig Dispense Refill  . Blood  Glucose Monitoring Suppl (ACCU-CHEK AVIVA PLUS) w/Device KIT Use to check blood sugar 1 time per day. Dx code E11.9 1 kit 0  . brimonidine-timolol (COMBIGAN) 0.2-0.5 % ophthalmic solution Place 1 drop into both eyes every 12 (twelve) hours.    Marland Kitchen ELIQUIS 5 MG TABS tablet TAKE 1 TABLET (5 MG TOTAL) BY MOUTH 2 (TWO) TIMES DAILY. 60 tablet 3  . esomeprazole (NEXIUM) 40 MG capsule Take 40 mg by mouth daily.     . furosemide (LASIX) 20 MG tablet TAKE 1 TABLET BY MOUTH DAILY. 90 tablet 2  . glucose blood (ACCU-CHEK AVIVA PLUS) test strip Use to check blood sugar 1 time per day. Dx code E11.9 100 each 2  . glucose blood (ONE TOUCH TEST STRIPS) test strip Use to check blood sugar 1 time per day. 100 each 12  . imatinib  (GLEEVEC) 100 MG tablet Take 2 tablets (200 mg total) by mouth 2 (two) times daily with a meal. Take with meals and large glass of water.Caution:Chemotherapy.    . isosorbide mononitrate (IMDUR) 30 MG 24 hr tablet Take 1 tablet (30 mg total) by mouth daily. 90 tablet 3  . Lancets (ACCU-CHEK MULTICLIX) lancets Use to check blood sugar 1 time per day.Dx code E11.9 100 each 2  . Lancets (ONETOUCH ULTRASOFT) lancets Use to check blood sugar 1 time per day. 100 each 12  . lidocaine (LIDODERM) 5 % Place 1 patch onto the skin daily. Remove & Discard patch within 12 hours or as directed by MD 30 patch 11  . lisinopril (PRINIVIL,ZESTRIL) 5 MG tablet Take 1 tablet (5 mg total) by mouth daily. 30 tablet 11  . loperamide (IMODIUM) 2 MG capsule Take 2 mg by mouth every morning. For diarrhea    . LORazepam (ATIVAN) 0.5 MG tablet Take 1 tablet (0.5 mg total) by mouth at bedtime. As needed for sleep 30 tablet 2  . Melatonin 3 MG TABS Take 1 tablet by mouth at bedtime.     . Multiple Vitamin (MULTIVITAMIN) tablet Take 1 tablet by mouth daily.    Marland Kitchen NITROSTAT 0.4 MG SL tablet PLACE 1 TABLET UNDER THE TONGUE EVERY 5 MINUTES X3 DOSES AS NEEDED FOR CHEST PAIN 100 tablet 1  . ondansetron (ZOFRAN ODT) 4 MG disintegrating tablet Take 1-2 tablets (4-8 mg total) by mouth every 8 (eight) hours as needed for nausea or vomiting. 20 tablet 0  . predniSONE (DELTASONE) 5 MG tablet TAKE 1 TABLET (5 MG TOTAL) BY MOUTH DAILY. 90 tablet 3  . promethazine (PHENERGAN) 12.5 MG tablet Take 1 tablet (12.5 mg total) by mouth every 6 (six) hours as needed for nausea or vomiting. 30 tablet 0  . pyridostigmine (MESTINON) 60 MG tablet Take 1 tablet (60 mg total) by mouth 3 (three) times daily. 90 tablet 11  . rosuvastatin (CRESTOR) 10 MG tablet TAKE 1/2 TABLETS BY MOUTH DAILY. 45 tablet 0  . [DISCONTINUED] zolpidem (AMBIEN) 10 MG tablet Take 10 mg by mouth at bedtime as needed.      No current facility-administered medications on file prior to  visit.     Allergies  Allergen Reactions  . Oxycodone-Acetaminophen Other (See Comments)    "too strong"  . Phenergan [Promethazine Hcl]     Eye swelling when combined with Zofran? Has taken it in the past without side effects  . Zofran [Ondansetron Hcl]     Reaction when given with phenergan?  . Sulfamethoxazole Rash and Itching    Family History  Problem Relation Age  of Onset  . Heart disease Mother   . Heart attack Mother   . Stroke Mother   . Heart disease Father   . Heart attack Father   . Stroke Father   . Breast cancer Sister     Twin   . Heart attack Sister   . Dementia Sister   . Heart disease Sister   . Heart attack Sister   . Clotting disorder Sister   . Heart attack Sister   . Colon cancer Neg Hx     BP 104/80   Pulse 98   Wt 178 lb 12.8 oz (81.1 kg)   SpO2 90%   BMI 28.00 kg/m    Review of Systems Denies fever.  He has slight sob at night.      Objective:   Physical Exam VITAL SIGNS:  See vs page GENERAL: no distress head: no deformity  eyes: no periorbital swelling, no proptosis  external nose and ears are normal  mouth: no lesion seen Left TM is red.  Right is normal.  LUNGS:  Clear to auscultation.     A1c=5.5% CXR: ? infiltrate    Assessment & Plan:  MG: this limits antibiotic options.  Pneumonia, new Nausea: due to tussionex DM: well-controlled on no medication.  We'll follow Patient is advised the following: Patient Instructions  Here is a prescription for a different type of cough syrup. I have sent 2 prescriptions to your pharmacy: for a different antibiotic, and inhaler. A chest x-ray is requested for you today.  We'll let you know about the results.  Loratadine-d (non-prescription) will help your congestion.   I hope you feel better soon.  If you don't feel better by next week, please call back.  Please call sooner if you get worse.

## 2016-01-15 NOTE — Telephone Encounter (Signed)
Do you want to order lab work on him before I schedule for the reclast?

## 2016-01-15 NOTE — Telephone Encounter (Signed)
[name], See message.

## 2016-01-15 NOTE — Telephone Encounter (Signed)
I ordered Please advise pt to take calcium 1200 mg per day, and vitamin-D, 400 units per day

## 2016-01-20 ENCOUNTER — Ambulatory Visit: Payer: Medicare Other | Admitting: Physician Assistant

## 2016-02-04 ENCOUNTER — Other Ambulatory Visit: Payer: Self-pay | Admitting: Gastroenterology

## 2016-02-04 ENCOUNTER — Telehealth: Payer: Self-pay | Admitting: Gastroenterology

## 2016-02-04 ENCOUNTER — Other Ambulatory Visit: Payer: Medicare Other

## 2016-02-04 DIAGNOSIS — R197 Diarrhea, unspecified: Secondary | ICD-10-CM

## 2016-02-04 NOTE — Telephone Encounter (Signed)
Dr Havery Moros, pt has vomiting and diarrhea, can't eat or sleep. These are similar to what he has had in the past only worse.  He has a follow up with you on 03/12/16.  I offered 02/12/16 but the pt declined he has other appts and feels he needs to be seen today.  You have a 3:45 banding appt today.  Can I add or do you prefer he go to the ED for eval?  Last saw Anderson Malta on 01/01/16.  Please advise

## 2016-02-04 NOTE — Telephone Encounter (Signed)
I called the patent discuss his symptoms. He endorses severe diarrhea the past few days. Multiple episodes of loose stools. He had 1 episode of vomiting this morning however while he has chronic nausea he says this isn't bothering him too much. No fevers. He reports a history of pneumonia earlier this month with antibiotic use. He's been using Imodium in recent days and stating it is not working well.  Recommend stool testing for acute infectious diarrhea, will send for C. Difficile and GI pathogen panel. I have placed the orders. I recommend he use Pedialyte or Gatorade to maintain hydration. If he cannot maintain hydration and frequency of diarrhea is too much she may need to go to the ER for IV fluids, although states he doesn't think he needs this at present time. He should hold off on Imodium at this time in case he does have C. Difficile, can make it worse. We will await the results of his stool testing and he will be contacted with results. If he feels worse in the interim he needs to contact us. All questions answered.

## 2016-02-05 LAB — CLOSTRIDIUM DIFFICILE BY PCR: Toxigenic C. Difficile by PCR: NOT DETECTED

## 2016-02-12 ENCOUNTER — Ambulatory Visit: Payer: Medicare Other | Admitting: Gastroenterology

## 2016-02-13 ENCOUNTER — Ambulatory Visit: Payer: Medicare Other | Admitting: Endocrinology

## 2016-02-13 LAB — GASTROINTESTINAL PATHOGEN PANEL PCR
C. difficile Tox A/B, PCR: NOT DETECTED
Campylobacter, PCR: NOT DETECTED
Cryptosporidium, PCR: NOT DETECTED
E COLI (ETEC) LT/ST, PCR: NOT DETECTED
E COLI (STEC) STX1/STX2, PCR: NOT DETECTED
E coli 0157, PCR: NOT DETECTED
Giardia lamblia, PCR: NOT DETECTED
NOROVIRUS, PCR: NOT DETECTED
Rotavirus A, PCR: NOT DETECTED
SHIGELLA, PCR: NOT DETECTED
Salmonella, PCR: NOT DETECTED

## 2016-02-16 NOTE — Progress Notes (Signed)
Subjective:    Patient ID: [name], male    DOB: 1940/09/17, 76 y.o.   MRN: [id number]  HPI Pt states few mos of moderate pain at the left hip, rad down the lat aspect of the left thigh.  No assoc numbness.  He saw ortho, who found only mild OA, and did injection.  He says he is not better.  He has safely takes tramadol in the past.  Past Medical History:  Diagnosis Date  . Carotid artery disease ([place])    a. Duplex 05/2014: 08-65% RICA, 7-84% LICA, elevated velocities in right subclavian artery, normal left subclavian artery..  . Chronic anticoagulation   . Chronic edema   . 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 Conjunctivitis unspecified   . Coronary atherosclerosis of unspecified type of vessel, native or graft    a. cath in 2003 showed 10-20% LM, scattered 20% prox-mid LAD, 30% more distal LAD, 50-60% stenosis of LAD towards apex, 20% Cx, 30% dRCA, focal 95% stenosis in smaller of 2 branches of PDA, EF 60-65%.  . Diabetes mellitus ([place])   . Diverticulosis of colon (without mention of hemorrhage)   . DJD (degenerative joint disease)   . DVT (deep venous thrombosis) [facility]) June 2013  . Dystonia   . Esophageal stricture   . Essential hypertension   . External hemorrhoids without mention of complication   . Gastroparesis   . GERD (gastroesophageal reflux disease)   . Glucose intolerance (impaired glucose tolerance)    history of  . History of kidney stones   . History of pneumonia   . HOH (hard of hearing)    hearing aids  . Hx of colonic polyps   . IBS (irritable bowel syndrome)   . Internal hemorrhoids without mention of complication   . Mixed hyperlipidemia   . Myasthenia gravis ([place])    uses prednisone  . Osteoporosis 2016  . PONV (postoperative nausea and vomiting)   . Ptosis   . Pulmonary embolism [facility]) June 2013   a. Bilateral PE 07/2011.  Marland Kitchen Rash and other nonspecific skin eruption   . 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 Stroke ([place]) 06/2015  .  Tubulovillous adenoma polyp of colon    2010    Past Surgical History:  Procedure Laterality Date  . ANGIOPLASTY  2001  . BACK SURGERY     x2, lumbar and cervical  . CARDIAC CATHETERIZATION    . CATARACT EXTRACTION Bilateral 12/12  . ESOPHAGOGASTRODUODENOSCOPY ENDOSCOPY  04/2015  . LUMBAR LAMINECTOMY/DECOMPRESSION MICRODISCECTOMY Right 09/14/2012   Procedure: Right Lumbar three-four, four-five, Lumbar five-Sacral one decompressive laminectomy;  Surgeon: [name], MD;  Location: [place] [facility];  Service: Neurosurgery;  Laterality: Right;  . SHOULDER SURGERY Left 05/07/2009  . TONSILLECTOMY      Social History   Social History  . Marital status: Married    Spouse name: Hassan Rowan  . Number of children: 2  . Years of education: 1-College   Occupational History  . retired Retired    Retired   Social History Main Topics  . Smoking status: Never Smoker  . Smokeless tobacco: Never Used  . Alcohol use No  . Drug use: No  . Sexual activity: Not Currently   Other Topics Concern  . Not on file   Social History Narrative   Lives at home w/ his wife   Patient is right handed.   Patient has 1-2 cups of caffeine daily.    Current Outpatient  Prescriptions on File Prior to Visit  Medication Sig Dispense Refill  . Blood Glucose Monitoring Suppl (ACCU-CHEK AVIVA PLUS) w/Device KIT Use to check blood sugar 1 time per day. Dx code E11.9 1 kit 0  . brimonidine-timolol (COMBIGAN) 0.2-0.5 % ophthalmic solution Place 1 drop into both eyes every 12 (twelve) hours.    Marland Kitchen ELIQUIS 5 MG TABS tablet TAKE 1 TABLET (5 MG TOTAL) BY MOUTH 2 (TWO) TIMES DAILY. 60 tablet 3  . esomeprazole (NEXIUM) 40 MG capsule Take 40 mg by mouth daily.     . furosemide (LASIX) 20 MG tablet TAKE 1 TABLET BY MOUTH DAILY. 90 tablet 2  . imatinib (GLEEVEC) 100 MG tablet Take 2 tablets (200 mg total) by mouth 2 (two) times daily with a meal. Take with meals and large glass of water.Caution:Chemotherapy.    . isosorbide  mononitrate (IMDUR) 30 MG 24 hr tablet Take 1 tablet (30 mg total) by mouth daily. 90 tablet 3  . lidocaine (LIDODERM) 5 % Place 1 patch onto the skin daily. Remove & Discard patch within 12 hours or as directed by MD 30 patch 11  . lisinopril (PRINIVIL,ZESTRIL) 5 MG tablet Take 1 tablet (5 mg total) by mouth daily. 30 tablet 11  . loperamide (IMODIUM) 2 MG capsule Take 2 mg by mouth 3 (three) times daily as needed for diarrhea or loose stools. For diarrhea    . LORazepam (ATIVAN) 0.5 MG tablet Take 1 tablet (0.5 mg total) by mouth at bedtime. As needed for sleep (Patient taking differently: Take 0.5 mg by mouth at bedtime as needed for sleep. ) 30 tablet 2  . Melatonin 3 MG TABS Take 1 tablet by mouth at bedtime as needed (for sleep).     . Multiple Vitamin (MULTIVITAMIN) tablet Take 1 tablet by mouth daily.    Marland Kitchen NITROSTAT 0.4 MG SL tablet PLACE 1 TABLET UNDER THE TONGUE EVERY 5 MINUTES X3 DOSES AS NEEDED FOR CHEST PAIN 100 tablet 1  . predniSONE (DELTASONE) 5 MG tablet TAKE 1 TABLET (5 MG TOTAL) BY MOUTH DAILY. 90 tablet 3  . promethazine (PHENERGAN) 12.5 MG tablet Take 1 tablet (12.5 mg total) by mouth every 6 (six) hours as needed for nausea or vomiting. 30 tablet 0  . pyridostigmine (MESTINON) 60 MG tablet Take 1 tablet (60 mg total) by mouth 3 (three) times daily. 90 tablet 11  . rosuvastatin (CRESTOR) 10 MG tablet TAKE 1/2 TABLETS BY MOUTH DAILY. (Patient taking differently: Take 5 mg by mouth once a day) 45 tablet 0  . [DISCONTINUED] zolpidem (AMBIEN) 10 MG tablet Take 10 mg by mouth at bedtime as needed.      No current facility-administered medications on file prior to visit.     Allergies  Allergen Reactions  . Phenergan [Promethazine Hcl] Swelling    Cannot be combined with Zofran because swelling of the eyes resulted and patient became very restless-  . Zofran [Ondansetron Hcl] Other (See Comments)    Cannot be combined with Phenergan because swelling of the eyes resulted and  patient became very restless-  . Sulfamethoxazole Rash and Itching    Family History  Problem Relation Age of Onset  . Heart disease Mother   . Heart attack Mother   . Stroke Mother   . Heart disease Father   . Heart attack Father   . Stroke Father   . Breast cancer Sister     Twin   . Heart attack Sister   . Dementia Sister   .  Heart disease Sister   . Heart attack Sister   . Clotting disorder Sister   . Heart attack Sister   . Colon cancer Neg Hx     BP 112/70   Pulse 64   Ht _0  (1.702 m)   Wt 179 lb (81.2 kg)   SpO2 98%   BMI 28.04 kg/m    Review of Systems Denies bowel or bladder retention.      Objective:   Physical Exam VITAL SIGNS:  See vs page.  GENERAL: no distress. Spine: nontender.  Gait: favors LLE. Neuro: sensation is intact to touch on the LLE.    Lab Results  Component Value Date   HGBA1C 5.7 (H) 06/21/2015    Lab Results  Component Value Date   CREATININE 1.44 (H) 06/25/2015   BUN 10 06/25/2015   NA 140 06/25/2015   K 3.7 06/25/2015   CL 99 (L) 06/25/2015   CO2 28 06/25/2015   MRI (2015): Abnormal MRI scan of the lumbar spine showing severe disc and facet degenerative changes throughout with stable postoperative changes of right laminectomy at L 3-4, L4-5 and L5-S1. There is severe left-sided foraminal narrowing at L4-5 and L5-S1.    Assessment & Plan:  Radicular sxs, new. renal insuff: this limits pain rx options Chronic prednisone.  He is already on antiinflammatory rx, so he does not need to add.  Patient is advised the following: Patient Instructions  Please go back top see Dr Ronnald Ramp.  you will receive a phone call, about a day and time for an appointment. Here is a prescription for the tramadol.

## 2016-02-18 DIAGNOSIS — M7062 Trochanteric bursitis, left hip: Secondary | ICD-10-CM | POA: Diagnosis not present

## 2016-02-21 ENCOUNTER — Ambulatory Visit (INDEPENDENT_AMBULATORY_CARE_PROVIDER_SITE_OTHER): Payer: Medicare Other | Admitting: Endocrinology

## 2016-02-21 ENCOUNTER — Encounter: Payer: Self-pay | Admitting: Endocrinology

## 2016-02-21 DIAGNOSIS — M5410 Radiculopathy, site unspecified: Secondary | ICD-10-CM

## 2016-02-21 MED ORDER — TRAMADOL HCL 50 MG PO TABS: 50.0000 mg | ORAL_TABLET | Freq: Four times a day (QID) | ORAL | 0 refills | Status: DC | PRN

## 2016-02-21 NOTE — Patient Instructions (Signed)
Please go back top see Dr Ronnald Ramp.  you will receive a phone call, about a day and time for an appointment. Here is a prescription for the tramadol.

## 2016-02-22 ENCOUNTER — Emergency Department (HOSPITAL_COMMUNITY)
Admission: EM | Admit: 2016-02-22 | Discharge: 2016-02-22 | Disposition: A | Discharge status: Home / Self Care | Payer: Medicare Other | Attending: Emergency Medicine | Admitting: Emergency Medicine

## 2016-02-22 ENCOUNTER — Emergency Department (HOSPITAL_BASED_OUTPATIENT_CLINIC_OR_DEPARTMENT_OTHER)
Admit: 2016-02-22 | Discharge: 2016-02-22 | Disposition: A | Discharge status: Home / Self Care | Payer: Medicare Other | Attending: Emergency Medicine | Admitting: Emergency Medicine

## 2016-02-22 ENCOUNTER — Encounter (HOSPITAL_COMMUNITY): Payer: Self-pay | Admitting: Emergency Medicine

## 2016-02-22 ENCOUNTER — Emergency Department (HOSPITAL_COMMUNITY): Payer: Medicare Other

## 2016-02-22 DIAGNOSIS — E119 Type 2 diabetes mellitus without complications: Secondary | ICD-10-CM | POA: Diagnosis not present

## 2016-02-22 DIAGNOSIS — M5136 Other intervertebral disc degeneration, lumbar region: Secondary | ICD-10-CM | POA: Diagnosis not present

## 2016-02-22 DIAGNOSIS — M545 Low back pain: Secondary | ICD-10-CM | POA: Diagnosis not present

## 2016-02-22 DIAGNOSIS — M5186 Other intervertebral disc disorders, lumbar region: Secondary | ICD-10-CM | POA: Insufficient documentation

## 2016-02-22 DIAGNOSIS — M549 Dorsalgia, unspecified: Secondary | ICD-10-CM

## 2016-02-22 DIAGNOSIS — Z856 Personal history of leukemia: Secondary | ICD-10-CM | POA: Diagnosis not present

## 2016-02-22 DIAGNOSIS — M7989 Other specified soft tissue disorders: Secondary | ICD-10-CM | POA: Diagnosis not present

## 2016-02-22 DIAGNOSIS — I110 Hypertensive heart disease with heart failure: Secondary | ICD-10-CM | POA: Diagnosis not present

## 2016-02-22 DIAGNOSIS — I5032 Chronic diastolic (congestive) heart failure: Secondary | ICD-10-CM | POA: Insufficient documentation

## 2016-02-22 DIAGNOSIS — Z7901 Long term (current) use of anticoagulants: Secondary | ICD-10-CM | POA: Insufficient documentation

## 2016-02-22 DIAGNOSIS — Z8673 Personal history of transient ischemic attack (TIA), and cerebral infarction without residual deficits: Secondary | ICD-10-CM | POA: Diagnosis not present

## 2016-02-22 DIAGNOSIS — I2510 Atherosclerotic heart disease of native coronary artery without angina pectoris: Secondary | ICD-10-CM | POA: Diagnosis not present

## 2016-02-22 DIAGNOSIS — M519 Unspecified thoracic, thoracolumbar and lumbosacral intervertebral disc disorder: Secondary | ICD-10-CM

## 2016-02-22 LAB — COMPREHENSIVE METABOLIC PANEL
ALT: 39 U/L (ref 17–63)
ANION GAP: 9 (ref 5–15)
AST: 36 U/L (ref 15–41)
Albumin: 3.8 g/dL (ref 3.5–5.0)
Alkaline Phosphatase: 44 U/L (ref 38–126)
BILIRUBIN TOTAL: 0.9 mg/dL (ref 0.3–1.2)
BUN: 14 mg/dL (ref 6–20)
CO2: 25 mmol/L (ref 22–32)
Calcium: 9 mg/dL (ref 8.9–10.3)
Chloride: 105 mmol/L (ref 101–111)
Creatinine, Ser: 1.44 mg/dL — ABNORMAL HIGH (ref 0.61–1.24)
GFR calc non Af Amer: 45 mL/min — ABNORMAL LOW (ref >60)
GFR, EST AFRICAN AMERICAN: 53 mL/min — AB (ref >60)
Glucose, Bld: 195 mg/dL — ABNORMAL HIGH (ref 65–99)
Potassium: 3.8 mmol/L (ref 3.5–5.1)
Sodium: 139 mmol/L (ref 135–145)
TOTAL PROTEIN: 5.6 g/dL — AB (ref 6.5–8.1)

## 2016-02-22 LAB — CBC WITH DIFFERENTIAL/PLATELET
Basophils Absolute: 0 10*3/uL (ref 0.0–0.1)
Basophils Relative: 0 %
Eosinophils Absolute: 0 10*3/uL (ref 0.0–0.7)
Eosinophils Relative: 0 %
HEMATOCRIT: 36.9 % — AB (ref 39.0–52.0)
HEMOGLOBIN: 12.3 g/dL — AB (ref 13.0–17.0)
LYMPHS ABS: 0.9 10*3/uL (ref 0.7–4.0)
Lymphocytes Relative: 10 %
MCH: 31.4 pg (ref 26.0–34.0)
MCHC: 33.3 g/dL (ref 30.0–36.0)
MCV: 94.1 fL (ref 78.0–100.0)
Monocytes Absolute: 0.7 10*3/uL (ref 0.1–1.0)
Monocytes Relative: 9 %
NEUTROS ABS: 6.8 10*3/uL (ref 1.7–7.7)
Neutrophils Relative %: 81 %
Platelets: 209 10*3/uL (ref 150–400)
RBC: 3.92 MIL/uL — ABNORMAL LOW (ref 4.22–5.81)
RDW: 13.6 % (ref 11.5–15.5)
WBC: 8.4 10*3/uL (ref 4.0–10.5)

## 2016-02-22 LAB — D-DIMER, QUANTITATIVE (NOT AT ARMC): D DIMER QUANT: 0.29 ug{FEU}/mL (ref 0.00–0.50)

## 2016-02-22 MED ORDER — FENTANYL CITRATE (PF) 100 MCG/2ML IJ SOLN
50.0000 ug | Freq: Once | INTRAMUSCULAR | Status: AC
  Administered 2016-02-22: 50 ug via INTRAVENOUS
  Filled 2016-02-22: qty 2

## 2016-02-22 MED ORDER — PREDNISONE 20 MG PO TABS: 40.0000 mg | ORAL_TABLET | Freq: Every day | ORAL | 0 refills | Status: DC

## 2016-02-22 MED ORDER — GADOBENATE DIMEGLUMINE 529 MG/ML IV SOLN
15.0000 mL | Freq: Once | INTRAVENOUS | Status: AC | PRN
  Administered 2016-02-22: 15 mL via INTRAVENOUS

## 2016-02-22 MED ORDER — LORAZEPAM 2 MG/ML IJ SOLN
1.0000 mg | Freq: Once | INTRAMUSCULAR | Status: AC
  Administered 2016-02-22: 1 mg via INTRAVENOUS
  Filled 2016-02-22: qty 1

## 2016-02-22 MED ORDER — HYDROCODONE-ACETAMINOPHEN 5-325 MG PO TABS: 1.0000 | ORAL_TABLET | ORAL | 0 refills | Status: DC | PRN

## 2016-02-22 NOTE — ED Triage Notes (Signed)
Pt. Stated, I've had left leg pain and hip since Nov. And yesterday it took a turn for the worse. My upper leg is swollen,. I've had a blood clot before and  Stroke.

## 2016-02-22 NOTE — ED Provider Notes (Signed)
[place] DEPT Provider Note   CSN: [id number] Arrival date & time: 02/22/16  1156     History   Chief Complaint Chief Complaint  Patient presents with  . Leg Pain  . Hip Pain  . Leg Swelling    HPI [name] is a 76 y.o. male.  HPI Patient had some chronic back and left hip pain for the last 2-3 months. Has seen orthophoria had a left hip injection. Was feeling better for about a day but now pain is gotten worse over last few days. Now his left thigh is swollen. Pain with movement of left leg. Has had previous DVT and pulmonary embolism. No chest pain or trouble breathing. No fevers. No trauma. Has had previous back surgery also by Dr. Ronnald Ramp. Saw Dr. Loanne Drilling, his endocrinologist to has arrange for him to follow with Dr. Ronnald Ramp, his neurosurgeon. No loss of bladder bowel control. He is on anticoagulation.    Past Medical History:  Diagnosis Date  . Carotid artery disease ([place])    a. Duplex 05/2014: 48-18% RICA, 5-63% LICA, elevated velocities in right subclavian artery, normal left subclavian artery..  . Chronic anticoagulation   . Chronic edema   . 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 Conjunctivitis unspecified   . Coronary atherosclerosis of unspecified type of vessel, native or graft    a. cath in 2003 showed 10-20% LM, scattered 20% prox-mid LAD, 30% more distal LAD, 50-60% stenosis of LAD towards apex, 20% Cx, 30% dRCA, focal 95% stenosis in smaller of 2 branches of PDA, EF 60-65%.  . Diabetes mellitus ([place])   . Diverticulosis of colon (without mention of hemorrhage)   . DJD (degenerative joint disease)   . DVT (deep venous thrombosis) [facility]) June 2013  . Dystonia   . Esophageal stricture   . Essential hypertension   . External hemorrhoids without mention of complication   . Gastroparesis   . GERD (gastroesophageal reflux disease)   . Glucose intolerance (impaired glucose tolerance)    history of  . History of kidney stones   . History of pneumonia   .  HOH (hard of hearing)    hearing aids  . Hx of colonic polyps   . IBS (irritable bowel syndrome)   . Internal hemorrhoids without mention of complication   . Mixed hyperlipidemia   . Myasthenia gravis ([place])    uses prednisone  . Osteoporosis 2016  . PONV (postoperative nausea and vomiting)   . Ptosis   . Pulmonary embolism [facility]) June 2013   a. Bilateral PE 07/2011.  Marland Kitchen Rash and other nonspecific skin eruption   . 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 Stroke ([place]) 06/2015  . Tubulovillous adenoma polyp of colon    2010    Patient Active Problem List   Diagnosis Date Noted  . Radicular pain of left lower extremity 02/21/2016  . Cough 01/10/2016  . 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 Myasthenia gravis ([place])   . Diabetes mellitus type 2 in nonobese (HCC)   . Coronary artery disease involving native coronary artery of native heart without angina pectoris   . Acute blood loss anemia   . Ataxia, post-stroke   . Gait disturbance, post-stroke   . Proximal leg weakness   . Cervical spondylosis without myelopathy   . Achilles tendinitis of right lower extremity   . Chronic diastolic CHF (congestive heart failure) ([place])   . Gastroesophageal reflux disease without esophagitis   .  Chronic kidney disease   . 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 Cerebellar stroke ([place])   . Ischemic stroke ([place])   . Cerebrovascular accident (CVA) due to thrombosis of left vertebral artery ([place])   . History of pulmonary embolus (PE)   . Chronic anticoagulation   . HLD (hyperlipidemia)   . Neck pain 03/19/2015  . Renal insufficiency 03/13/2015  . Pain in the chest   . 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[place])   . Chest pain syndrome 03/07/2015  . Chronic diastolic heart failure, NYHA class 1 ([place]) 03/07/2015  . Diabetic gastroparesis ([place]) 03/07/2015  . Screening for osteoporosis 07/19/2014  . Routine general  medical examination at a health care facility 10/23/2013  . Bilateral carotid bruits 05/24/2013  . Abnormal CT scan, lung 05/24/2013  . Encounter for therapeutic drug monitoring 03/14/2013  . Lymphadenopathy, inguinal 01/11/2013  . 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 Myasthenia gravis with exacerbation ([place]) 03/31/2012  . 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 GERD with stricture 09/23/2011  . Long term (current) use of anticoagulants 08/03/2011  . History of pulmonary embolism 07/30/2011  . History of DVT (deep vein thrombosis) 07/30/2011  . Subconjunctival hemorrhage 06/22/2011  . Bilateral conjunctivitis 04/09/2011  . Diabetes mellitus type 2, controlled ([place]) 12/18/2010  . Hearing loss 08/04/2010  . Ptosis of eyelid 12/30/2009  . Essential hypertension 04/24/2009  . HYPERLIPIDEMIA, MIXED 10/05/2008  . HEMORRHOIDS-INTERNAL 07/02/2008  . HEMORRHOIDS-EXTERNAL 07/02/2008  . DIVERTICULOSIS-COLON 07/02/2008  . PERSONAL HX COLONIC POLYPS 07/02/2008  . RASH-NONVESICULAR 02/07/2008  . PNEUMONIA 12/08/2007  . CML (chronic myelocytic leukemia) ([place]) 07/07/2007  . CAD (coronary artery disease) 07/07/2007  . NEPHROLITHIASIS, HX OF 07/07/2007    Past Surgical History:  Procedure Laterality Date  . ANGIOPLASTY  2001  . BACK SURGERY     x2, lumbar and cervical  . CARDIAC CATHETERIZATION    . CATARACT EXTRACTION Bilateral 12/12  . ESOPHAGOGASTRODUODENOSCOPY ENDOSCOPY  04/2015  . LUMBAR LAMINECTOMY/DECOMPRESSION MICRODISCECTOMY Right 09/14/2012   Procedure: Right Lumbar three-four, four-five, Lumbar five-Sacral one decompressive laminectomy;  Surgeon: [name], MD;  Location: [place] NEUR[facility] ORS;  Service: Neurosurgery;  Laterality: Right;  . SHOULDER SURGERY Left 05/07/2009  . TONSILLECTOMY         Home Medications    Prior to Admission medications   Medication Sig Start Date End Date Taking? Authorizing Provider  Blood Glucose Monitoring Suppl (ACCU-CHEK AVIVA PLUS)  w/Device KIT Use to check blood sugar 1 time per day. Dx code E11.9 01/08/16  Yes Renato Shin, MD  brimonidine-timolol (COMBIGAN) 0.2-0.5 % ophthalmic solution Place 1 drop into both eyes every 12 (twelve) hours.   Yes Historical Provider, MD  ELIQUIS 5 MG TABS tablet TAKE 1 TABLET (5 MG TOTAL) BY MOUTH 2 (TWO) TIMES DAILY. 12/09/15  Yes Kathrynn Ducking, MD  esomeprazole (NEXIUM) 40 MG capsule Take 40 mg by mouth daily.    Yes Historical Provider, MD  furosemide (LASIX) 20 MG tablet TAKE 1 TABLET BY MOUTH DAILY. 12/23/15  Yes Sherren Mocha, MD  imatinib (GLEEVEC) 100 MG tablet Take 2 tablets (200 mg total) by mouth 2 (two) times daily with a meal. Take with meals and large glass of water.Caution:Chemotherapy. 06/27/15  Yes Ivan Anchors Love, PA-C  isosorbide mononitrate (IMDUR) 30 MG 24 hr tablet Take 1 tablet (30 mg total) by mouth daily. 09/17/15  Yes Sherren Mocha, MD  lidocaine (LIDODERM) 5 % Place 1 patch onto the skin daily.  Remove & Discard patch within 12 hours or as directed by MD 10/11/15  Yes Renato Shin, MD  lisinopril (PRINIVIL,ZESTRIL) 5 MG tablet Take 1 tablet (5 mg total) by mouth daily. 11/05/15  Yes Sherren Mocha, MD  loperamide (IMODIUM) 2 MG capsule Take 2 mg by mouth 3 (three) times daily as needed for diarrhea or loose stools. For diarrhea   Yes Historical Provider, MD  LORazepam (ATIVAN) 0.5 MG tablet Take 1 tablet (0.5 mg total) by mouth at bedtime. As needed for sleep 07/11/15  Yes Renato Shin, MD  Melatonin 3 MG TABS Take 1 tablet by mouth at bedtime.    Yes Historical Provider, MD  Multiple Vitamin (MULTIVITAMIN) tablet Take 1 tablet by mouth daily.   Yes Historical Provider, MD  NITROSTAT 0.4 MG SL tablet PLACE 1 TABLET UNDER THE TONGUE EVERY 5 MINUTES X3 DOSES AS NEEDED FOR CHEST PAIN 11/19/15  Yes Sherren Mocha, MD  predniSONE (DELTASONE) 5 MG tablet TAKE 1 TABLET (5 MG TOTAL) BY MOUTH DAILY. 10/25/15  Yes Kathrynn Ducking, MD  pyridostigmine (MESTINON) 60 MG tablet Take 1  tablet (60 mg total) by mouth 3 (three) times daily. 09/03/15  Yes Kathrynn Ducking, MD  rosuvastatin (CRESTOR) 10 MG tablet TAKE 1/2 TABLETS BY MOUTH DAILY. 09/09/15  Yes Sherren Mocha, MD  traMADol (ULTRAM) 50 MG tablet Take 1 tablet (50 mg total) by mouth every 6 (six) hours as needed. 02/21/16  Yes Renato Shin, MD  HYDROcodone-acetaminophen (NORCO/VICODIN) 5-325 MG tablet Take 1-2 tablets by mouth every 4 (four) hours as needed. 02/22/16   Davonna Belling, MD  predniSONE (DELTASONE) 20 MG tablet Take 2 tablets (40 mg total) by mouth daily. 02/22/16   Davonna Belling, MD  promethazine (PHENERGAN) 12.5 MG tablet Take 1 tablet (12.5 mg total) by mouth every 6 (six) hours as needed for nausea or vomiting. 06/28/15   Bary Leriche, PA-C    Family History Family History  Problem Relation Age of Onset  . Heart disease Mother   . Heart attack Mother   . Stroke Mother   . Heart disease Father   . Heart attack Father   . Stroke Father   . Breast cancer Sister     Twin   . Heart attack Sister   . Dementia Sister   . Heart disease Sister   . Heart attack Sister   . Clotting disorder Sister   . Heart attack Sister   . Colon cancer Neg Hx     Social History Social History  Substance Use Topics  . Smoking status: Never Smoker  . Smokeless tobacco: Never Used  . Alcohol use No     Allergies   Phenergan [promethazine hcl]; Zofran [ondansetron hcl]; and Sulfamethoxazole   Review of Systems Review of Systems  Constitutional: Negative for appetite change.  HENT: Negative for congestion.   Respiratory: Negative for chest tightness.   Musculoskeletal: Positive for back pain and gait problem. Negative for neck pain.  Neurological: Negative for weakness and numbness.     Physical Exam Updated Vital Signs BP 176/81   Pulse 62   Temp 97.8 F (36.6 C) (Oral)   Resp 22   SpO2 99%   Physical Exam  Constitutional: He appears well-developed.  HENT:  Head: Normocephalic.  Neck: Neck  supple.  Cardiovascular: Normal rate.   Pulmonary/Chest: Effort normal.  Abdominal: Soft. There is no tenderness.  Musculoskeletal: He exhibits tenderness.  Tenderness over left lower lumbar spine. Pain with straight leg raise on  left side. Flexion and extension intact on left ankle. Sensation intact in the foot. Perineal sensation intact.  Neurological: He is alert.  Skin: Skin is warm. Capillary refill takes less than 2 seconds.  Psychiatric: He has a normal mood and affect.     ED Treatments / Results  Labs (all labs ordered are listed, but only abnormal results are displayed) Labs Reviewed  CBC WITH DIFFERENTIAL/PLATELET - Abnormal; Notable for the following:       Result Value   RBC 3.92 (*)    Hemoglobin 12.3 (*)    HCT 36.9 (*)    All other components within normal limits  COMPREHENSIVE METABOLIC PANEL - Abnormal; Notable for the following:    Glucose, Bld 195 (*)    Creatinine, Ser 1.44 (*)    Total Protein 5.6 (*)    GFR calc non Af Amer 45 (*)    GFR calc Af Amer 53 (*)    All other components within normal limits  D-DIMER, QUANTITATIVE (NOT AT [facility])    EKG  EKG Interpretation None       Radiology Mr Lumbar Spine W Wo Contrast  Result Date: 02/22/2016 CLINICAL DATA:  Patient states he months of moderate pain and LEFT hip radiating down the lateral aspect of the LEFT thigh. EXAM: MRI LUMBAR SPINE WITHOUT AND WITH CONTRAST TECHNIQUE: Multiplanar and multiecho pulse sequences of the lumbar spine were obtained without and with intravenous contrast. CONTRAST:  67m MULTIHANCE GADOBENATE DIMEGLUMINE 529 MG/ML IV SOLN COMPARISON:  Low field strength MRI of lumbar spine performed at [facility], 04/06/2012. FINDINGS: Segmentation:  Standard. Alignment: 4 mm anterolisthesis L4-5. Trace retrolisthesis L5-S1 and L2-3. Vertebrae: No worrisome osseous lesion. Endplate reactive changes most pronounced at L5-S1. Conus medullaris: Extends to the L1 level and appears normal.  Paraspinal and other soft tissues: Unremarkable. No abnormal postcontrast enhancement. Disc levels: L1-L2:  Normal. L2-L3: Central and rightward protrusion at the interspace level disc material extends into both neural foramina, probably worse on the RIGHT. Just below the interspace, there is a large extruded fragment downward on the LEFT. Representative cross-section of 7 x 13 mm. Moderate stenosis is compounded by posterior element hypertrophy. Severe LEFT L3 nerve root impingement behind the LEFT L3 pedicle. BILATERAL L3 and L2 nerve root impingement at the interspace level. L3-L4: Central and rightward disc extrusion. Mild stenosis. Caudally migrated free fragment. RIGHT L4 nerve root impingement is likely. L4-L5: 4 mm anterolisthesis. Advanced posterior element hypertrophy. Central and leftward extrusion. Moderate stenosis. LEFT greater than RIGHT L4 and L5 nerve root impingement. L5-S1: Disc space narrowing. Central and leftward extrusion. Posterior element hypertrophy. Mild stenosis. LEFT greater than RIGHT S1 nerve root impingement. RIGHT greater than LEFT L5 nerve root impingement. Compared with the prior scan, there is significant worsening at L2-L3. The other levels appear fairly similar. IMPRESSION: Advanced multilevel spondylosis, similar to 2012, except for the development of a large disc extrusion at L2-3, central and to the LEFT with caudally migrated fragment. LEFT L3 nerve root impingement is the dominant finding. See discussion above. Electronically Signed   By: JStaci RighterM.D.   On: 02/22/2016 15:34    Procedures Procedures (including critical care time)  Medications Ordered in ED Medications  fentaNYL (SUBLIMAZE) injection 50 mcg (50 mcg Intravenous Given 02/22/16 1358)  LORazepam (ATIVAN) injection 1 mg (1 mg Intravenous Given 02/22/16 1418)  gadobenate dimeglumine (MULTIHANCE) injection 15 mL (15 mLs Intravenous Contrast Given 02/22/16 1504)     Initial Impression / Assessment  and  Plan / ED Course  I have reviewed the triage vital signs and the nursing notes.  Pertinent labs & imaging results that were available during my care of the patient were reviewed by me and considered in my medical decision making (see chart for details).  Clinical Course     Patient with back pain. MRI done and showed some bulging disc that is new. Patient is on anticoagulation. Perineal sensation intact. Will treat with steroids and pain medicines. Will follow with his neurosurgeon.  Final Clinical Impressions(s) / ED Diagnoses   Final diagnoses:  Back pain  Disc disorder of lumbar region    New Prescriptions New Prescriptions   HYDROCODONE-ACETAMINOPHEN (NORCO/VICODIN) 5-325 MG TABLET    Take 1-2 tablets by mouth every 4 (four) hours as needed.   PREDNISONE (DELTASONE) 20 MG TABLET    Take 2 tablets (40 mg total) by mouth daily.     Davonna Belling, MD 02/22/16 [phone]

## 2016-02-22 NOTE — ED Notes (Signed)
Patient transported to MRI 

## 2016-02-22 NOTE — Progress Notes (Signed)
VASCULAR LAB PRELIMINARY  PRELIMINARY  PRELIMINARY  PRELIMINARY  Left lower extremity venous duplex completed.    Preliminary report:  There is no DVT or SVT noted in the left lower extremity.    Called report to Dr. Harlin Rain, [facility], RVT 02/22/2016, 3:56 PM

## 2016-02-28 ENCOUNTER — Other Ambulatory Visit: Payer: Self-pay | Admitting: Neurological Surgery

## 2016-02-28 DIAGNOSIS — M5126 Other intervertebral disc displacement, lumbar region: Secondary | ICD-10-CM | POA: Diagnosis not present

## 2016-03-02 ENCOUNTER — Other Ambulatory Visit (HOSPITAL_COMMUNITY): Payer: Self-pay | Admitting: Neurological Surgery

## 2016-03-02 ENCOUNTER — Other Ambulatory Visit: Payer: Self-pay | Admitting: Radiology

## 2016-03-02 DIAGNOSIS — Z86718 Personal history of other venous thrombosis and embolism: Secondary | ICD-10-CM

## 2016-03-03 ENCOUNTER — Encounter (HOSPITAL_COMMUNITY): Payer: Self-pay

## 2016-03-03 ENCOUNTER — Ambulatory Visit (HOSPITAL_COMMUNITY)
Admission: RE | Admit: 2016-03-03 | Discharge: 2016-03-03 | Disposition: A | Discharge status: Home / Self Care | Payer: Medicare Other | Source: Ambulatory Visit | Attending: Neurological Surgery | Admitting: Neurological Surgery

## 2016-03-03 DIAGNOSIS — M48061 Spinal stenosis, lumbar region without neurogenic claudication: Secondary | ICD-10-CM | POA: Diagnosis not present

## 2016-03-03 DIAGNOSIS — Z86718 Personal history of other venous thrombosis and embolism: Secondary | ICD-10-CM | POA: Diagnosis not present

## 2016-03-03 DIAGNOSIS — Z803 Family history of malignant neoplasm of breast: Secondary | ICD-10-CM | POA: Diagnosis not present

## 2016-03-03 DIAGNOSIS — Z8673 Personal history of transient ischemic attack (TIA), and cerebral infarction without residual deficits: Secondary | ICD-10-CM | POA: Diagnosis not present

## 2016-03-03 DIAGNOSIS — I10 Essential (primary) hypertension: Secondary | ICD-10-CM | POA: Insufficient documentation

## 2016-03-03 DIAGNOSIS — K3184 Gastroparesis: Secondary | ICD-10-CM | POA: Insufficient documentation

## 2016-03-03 DIAGNOSIS — Z408 Encounter for other prophylactic surgery: Secondary | ICD-10-CM | POA: Insufficient documentation

## 2016-03-03 DIAGNOSIS — Z87442 Personal history of urinary calculi: Secondary | ICD-10-CM | POA: Insufficient documentation

## 2016-03-03 DIAGNOSIS — M5126 Other intervertebral disc displacement, lumbar region: Secondary | ICD-10-CM | POA: Insufficient documentation

## 2016-03-03 DIAGNOSIS — E1143 Type 2 diabetes mellitus with diabetic autonomic (poly)neuropathy: Secondary | ICD-10-CM | POA: Diagnosis not present

## 2016-03-03 DIAGNOSIS — Z7901 Long term (current) use of anticoagulants: Secondary | ICD-10-CM | POA: Insufficient documentation

## 2016-03-03 DIAGNOSIS — Z823 Family history of stroke: Secondary | ICD-10-CM | POA: Diagnosis not present

## 2016-03-03 DIAGNOSIS — K589 Irritable bowel syndrome without diarrhea: Secondary | ICD-10-CM | POA: Insufficient documentation

## 2016-03-03 DIAGNOSIS — I2510 Atherosclerotic heart disease of native coronary artery without angina pectoris: Secondary | ICD-10-CM | POA: Insufficient documentation

## 2016-03-03 DIAGNOSIS — M810 Age-related osteoporosis without current pathological fracture: Secondary | ICD-10-CM | POA: Insufficient documentation

## 2016-03-03 DIAGNOSIS — M47816 Spondylosis without myelopathy or radiculopathy, lumbar region: Secondary | ICD-10-CM | POA: Diagnosis not present

## 2016-03-03 DIAGNOSIS — Z86711 Personal history of pulmonary embolism: Secondary | ICD-10-CM | POA: Diagnosis not present

## 2016-03-03 DIAGNOSIS — Z8249 Family history of ischemic heart disease and other diseases of the circulatory system: Secondary | ICD-10-CM | POA: Diagnosis not present

## 2016-03-03 DIAGNOSIS — Z7952 Long term (current) use of systemic steroids: Secondary | ICD-10-CM | POA: Diagnosis not present

## 2016-03-03 DIAGNOSIS — E782 Mixed hyperlipidemia: Secondary | ICD-10-CM | POA: Diagnosis not present

## 2016-03-03 DIAGNOSIS — G7000 Myasthenia gravis without (acute) exacerbation: Secondary | ICD-10-CM | POA: Diagnosis not present

## 2016-03-03 DIAGNOSIS — K219 Gastro-esophageal reflux disease without esophagitis: Secondary | ICD-10-CM | POA: Insufficient documentation

## 2016-03-03 DIAGNOSIS — K648 Other hemorrhoids: Secondary | ICD-10-CM | POA: Diagnosis not present

## 2016-03-03 HISTORY — PX: IR GENERIC HISTORICAL: IMG1180011

## 2016-03-03 LAB — CBC WITH DIFFERENTIAL/PLATELET
BASOS PCT: 0 %
Basophils Absolute: 0 10*3/uL (ref 0.0–0.1)
EOS ABS: 0 10*3/uL (ref 0.0–0.7)
Eosinophils Relative: 0 %
HCT: 39.9 % (ref 39.0–52.0)
Hemoglobin: 13.3 g/dL (ref 13.0–17.0)
Lymphocytes Relative: 7 %
Lymphs Abs: 1 10*3/uL (ref 0.7–4.0)
MCH: 31.6 pg (ref 26.0–34.0)
MCHC: 33.3 g/dL (ref 30.0–36.0)
MCV: 94.8 fL (ref 78.0–100.0)
MONO ABS: 1.4 10*3/uL — AB (ref 0.1–1.0)
MONOS PCT: 10 %
Neutro Abs: 11.1 10*3/uL — ABNORMAL HIGH (ref 1.7–7.7)
Neutrophils Relative %: 83 %
Platelets: 168 10*3/uL (ref 150–400)
RBC: 4.21 MIL/uL — ABNORMAL LOW (ref 4.22–5.81)
RDW: 14.3 % (ref 11.5–15.5)
WBC: 13.5 10*3/uL — ABNORMAL HIGH (ref 4.0–10.5)

## 2016-03-03 LAB — PROTIME-INR
INR: 1.35
PROTHROMBIN TIME: 16.7 s — AB (ref 11.4–15.2)

## 2016-03-03 LAB — BASIC METABOLIC PANEL
Anion gap: 9 (ref 5–15)
BUN: 17 mg/dL (ref 6–20)
CALCIUM: 9 mg/dL (ref 8.9–10.3)
CO2: 30 mmol/L (ref 22–32)
CREATININE: 1.49 mg/dL — AB (ref 0.61–1.24)
Chloride: 98 mmol/L — ABNORMAL LOW (ref 101–111)
GFR calc non Af Amer: 44 mL/min — ABNORMAL LOW (ref >60)
GFR, EST AFRICAN AMERICAN: 50 mL/min — AB (ref >60)
GLUCOSE: 100 mg/dL — AB (ref 65–99)
Potassium: 4.3 mmol/L (ref 3.5–5.1)
Sodium: 137 mmol/L (ref 135–145)

## 2016-03-03 MED ORDER — MIDAZOLAM HCL 2 MG/2ML IJ SOLN
INTRAMUSCULAR | Status: AC | PRN
  Administered 2016-03-03: 1 mg via INTRAVENOUS

## 2016-03-03 MED ORDER — LIDOCAINE HCL 1 % IJ SOLN
INTRAMUSCULAR | Status: AC | PRN
  Administered 2016-03-03: 10 mL

## 2016-03-03 MED ORDER — FENTANYL CITRATE (PF) 100 MCG/2ML IJ SOLN
INTRAMUSCULAR | Status: AC
  Filled 2016-03-03: qty 2

## 2016-03-03 MED ORDER — IOPAMIDOL (ISOVUE-300) INJECTION 61%
INTRAVENOUS | Status: AC
  Administered 2016-03-03: 35 mL
  Filled 2016-03-03: qty 100

## 2016-03-03 MED ORDER — FENTANYL CITRATE (PF) 100 MCG/2ML IJ SOLN
INTRAMUSCULAR | Status: AC | PRN
  Administered 2016-03-03: 50 ug via INTRAVENOUS

## 2016-03-03 MED ORDER — LIDOCAINE HCL (PF) 1 % IJ SOLN
INTRAMUSCULAR | Status: AC
  Filled 2016-03-03: qty 10

## 2016-03-03 MED ORDER — MIDAZOLAM HCL 2 MG/2ML IJ SOLN
INTRAMUSCULAR | Status: AC
  Filled 2016-03-03: qty 2

## 2016-03-03 MED ORDER — SODIUM CHLORIDE 0.9 % IV SOLN
INTRAVENOUS | Status: DC
Start: 2016-03-03 — End: 2016-03-04

## 2016-03-03 NOTE — Discharge Instructions (Signed)
Inferior Vena Cava Filter, Care After Refer to this sheet in the next few weeks. These instructions provide you with information on caring for yourself after your procedure. Your health care provider may also give you more specific instructions. Your treatment has been planned according to current medical practices, but problems sometimes occur. Call your health care provider if you have any problems or questions after your procedure. WHAT TO EXPECT AFTER THE PROCEDURE After your procedure, it is typical to have the following:  Mild pain in the area where the filter was inserted.  Mild bruising in the area where the filter was inserted. HOME CARE INSTRUCTIONS  You will be given medicine to control pain. Only take over-the-counter or prescription medicines for pain, fever, or discomfort as directed by your health care provider.  A bandage (dressing) has been placed over the insertion site. Follow your health care provider's instructions on how to care for it.  Keep the insertion site clean and dry.  Do not soak in a bath tub or pool until the filter insertion site has healed.  Do not drive if you are taking narcotic pain medicines. Follow your health care provider's instructions about driving.  Do not return to work or school until your health care provider says it is okay.   Keep all follow-up appointments.  SEEK IMMEDIATE MEDICAL CARE IF:  You develop swelling and discoloration or pain in the legs.  Your legs become pale and cold or blue.  You develop shortness of breath, feel faint, or pass out.  You develop chest pain, a cough, or difficulty breathing.  You cough up blood.  You develop a rash or feel you are having problems that may be a side effect of medicines.  You develop weakness, difficulty moving your arms or legs, or balance problems.  You develop problems with speech or vision. This information is not intended to replace advice given to you by your health care  provider. Make sure you discuss any questions you have with your health care provider. Document Released: 11/16/2012 Document Reviewed: 11/16/2012 Elsevier Interactive Patient Education  2017 [employer].

## 2016-03-03 NOTE — Procedures (Signed)
S/p denali temp IVC filter insertion  No comp Stable Full report in PACS CAN REMOVE AFTER LUMBAR SURGERY

## 2016-03-03 NOTE — Sedation Documentation (Signed)
Patient is resting comfortably. 

## 2016-03-03 NOTE — H&P (Signed)
Chief Complaint: Patient was seen in consultation today for retrievable inferior vena cava filter placement at the request of [name]  Referring Physician([name]): [name]  Supervising Physician: [name]  Patient Status: [name] - Out-pt  History of Present Illness: [name] is a 76 y.o. male   Hx PE/DVT 2013 Had been using coumadin since then Suffered CVA 2017---changed to Eliquis and still using this daily  Now with new back pain and findings of L 2-3 disc For disc surgery with Dr [name] 03/11/16 Will have to come off Eliquis for surgery and after Request for retrievable IVC filter for now per Dr [name]  Now scheduled for same   Past Medical History:  Diagnosis Date  . Carotid artery disease ([place])    a. Duplex 05/2014: 21-11% RICA, 5-52% LICA, elevated velocities in right subclavian artery, normal left subclavian artery..  . Chronic anticoagulation   . Chronic edema   . 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 Conjunctivitis unspecified   . Coronary atherosclerosis of unspecified type of vessel, native or graft    a. cath in 2003 showed 10-20% LM, scattered 20% prox-mid LAD, 30% more distal LAD, 50-60% stenosis of LAD towards apex, 20% Cx, 30% dRCA, focal 95% stenosis in smaller of 2 branches of PDA, EF 60-65%.  . Diabetes mellitus ([place])   . Diverticulosis of colon (without mention of hemorrhage)   . DJD (degenerative joint disease)   . DVT (deep venous thrombosis) [facility]) June 2013  . Dystonia   . Esophageal stricture   . Essential hypertension   . External hemorrhoids without mention of complication   . Gastroparesis   . GERD (gastroesophageal reflux disease)   . Glucose intolerance (impaired glucose tolerance)    history of  . History of kidney stones   . History of pneumonia   . HOH (hard of hearing)    hearing aids  . Hx of colonic polyps   . IBS (irritable bowel syndrome)   . Internal hemorrhoids without mention of complication   . Mixed  hyperlipidemia   . Myasthenia gravis ([place])    uses prednisone  . Osteoporosis 2016  . PONV (postoperative nausea and vomiting)   . Ptosis   . Pulmonary embolism [facility]) June 2013   a. Bilateral PE 07/2011.  Marland Kitchen Rash and other nonspecific skin eruption   . 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 Stroke ([place]) 06/2015  . Tubulovillous adenoma polyp of colon    2010    Past Surgical History:  Procedure Laterality Date  . ANGIOPLASTY  2001  . BACK SURGERY     x2, lumbar and cervical  . CARDIAC CATHETERIZATION    . CATARACT EXTRACTION Bilateral 12/12  . ESOPHAGOGASTRODUODENOSCOPY ENDOSCOPY  04/2015  . LUMBAR LAMINECTOMY/DECOMPRESSION MICRODISCECTOMY Right 09/14/2012   Procedure: Right Lumbar three-four, four-five, Lumbar five-Sacral one decompressive laminectomy;  Surgeon: Tristan Moore, MD;  Location: [place] [facility];  Service: Neurosurgery;  Laterality: Right;  . SHOULDER SURGERY Left 05/07/2009  . TONSILLECTOMY      Allergies: Phenergan [promethazine hcl]; Zofran [ondansetron hcl]; and Sulfamethoxazole  Medications: Prior to Admission medications   Medication Sig Start Date End Date Taking? Authorizing Provider  brimonidine-timolol (COMBIGAN) 0.2-0.5 % ophthalmic solution Place 1 drop into both eyes every 12 (twelve) hours.   Yes Historical Provider, MD  ELIQUIS 5 MG TABS tablet TAKE 1 TABLET (5 MG TOTAL) BY MOUTH 2 (TWO) TIMES DAILY. 12/09/15  Yes Kathrynn Ducking, MD  esomeprazole (  NEXIUM) 40 MG capsule Take 40 mg by mouth daily.    Yes Historical Provider, MD  furosemide (LASIX) 20 MG tablet TAKE 1 TABLET BY MOUTH DAILY. 12/23/15  Yes Sherren Mocha, MD  HYDROcodone-acetaminophen [facility]) 10-325 MG tablet Take 1 tablet by mouth every 6 (six) hours as needed (for pain.).   Yes Historical Provider, MD  imatinib (GLEEVEC) 100 MG tablet Take 2 tablets (200 mg total) by mouth 2 (two) times daily with a meal. Take with meals and large glass of water.Caution:Chemotherapy. Patient taking  differently: Take 200 mg by mouth daily with lunch. Take with meals and large glass of water.Caution:Chemotherapy. 06/27/15  Yes Ivan Anchors Love, PA-C  isosorbide mononitrate (IMDUR) 30 MG 24 hr tablet Take 1 tablet (30 mg total) by mouth daily. 09/17/15  Yes Sherren Mocha, MD  lidocaine (LIDODERM) 5 % Place 1 patch onto the skin daily. Remove & Discard patch within 12 hours or as directed by MD 10/11/15  Yes Renato Shin, MD  lisinopril (PRINIVIL,ZESTRIL) 5 MG tablet Take 1 tablet (5 mg total) by mouth daily. 11/05/15  Yes Sherren Mocha, MD  loperamide (IMODIUM) 2 MG capsule Take 2 mg by mouth 3 (three) times daily as needed for diarrhea or loose stools. For diarrhea   Yes Historical Provider, MD  LORazepam (ATIVAN) 0.5 MG tablet Take 1 tablet (0.5 mg total) by mouth at bedtime. As needed for sleep Patient taking differently: Take 0.5 mg by mouth at bedtime as needed for sleep.  07/11/15  Yes Renato Shin, MD  Melatonin 3 MG TABS Take 3 mg by mouth at bedtime as needed (for sleep).    Yes Historical Provider, MD  predniSONE (DELTASONE) 5 MG tablet TAKE 1 TABLET (5 MG TOTAL) BY MOUTH DAILY. 10/25/15  Yes Kathrynn Ducking, MD  pyridostigmine (MESTINON) 60 MG tablet Take 1 tablet (60 mg total) by mouth 3 (three) times daily. Patient taking differently: Take 60 mg by mouth daily.  09/03/15  Yes Kathrynn Ducking, MD  rosuvastatin (CRESTOR) 10 MG tablet TAKE 1/2 TABLETS BY MOUTH DAILY. Patient taking differently: Take 5 mg by mouth once a day 09/09/15  Yes Sherren Mocha, MD  Blood Glucose Monitoring Suppl (ACCU-CHEK AVIVA PLUS) w/Device KIT Use to check blood sugar 1 time per day. Dx code E11.9 Patient taking differently: 1 strip by Other route See admin instructions. Use to check blood sugar once to twice a week. Dx code E11.9 01/08/16   Renato Shin, MD  HYDROcodone-acetaminophen (NORCO/VICODIN) 5-325 MG tablet Take 1-2 tablets by mouth every 4 (four) hours as needed. Patient taking differently: Take 1-2 tablets  by mouth every 4 (four) hours as needed (for pain.).  02/22/16   Davonna Belling, MD  Multiple Vitamin (MULTIVITAMIN) tablet Take 1 tablet by mouth daily.    Historical Provider, MD  NITROSTAT 0.4 MG SL tablet PLACE 1 TABLET UNDER THE TONGUE EVERY 5 MINUTES X3 DOSES AS NEEDED FOR CHEST PAIN 11/19/15   Sherren Mocha, MD  promethazine (PHENERGAN) 12.5 MG tablet Take 1 tablet (12.5 mg total) by mouth every 6 (six) hours as needed for nausea or vomiting. 06/28/15   Bary Leriche, PA-C  traMADol (ULTRAM) 50 MG tablet Take 1 tablet (50 mg total) by mouth every 6 (six) hours as needed. Patient not taking: Reported on 03/02/2016 02/21/16   Renato Shin, MD     Family History  Problem Relation Age of Onset  . Heart disease Mother   . Heart attack Mother   . Stroke Mother   .  Heart disease Father   . Heart attack Father   . Stroke Father   . Breast cancer Sister     Twin   . Heart attack Sister   . Dementia Sister   . Heart disease Sister   . Heart attack Sister   . Clotting disorder Sister   . Heart attack Sister   . Colon cancer Neg Hx     Social History   Social History  . Marital status: Married    Spouse name: Hassan Rowan  . Number of children: 2  . Years of education: 1-College   Occupational History  . retired Retired    Retired   Social History Main Topics  . Smoking status: Never Smoker  . Smokeless tobacco: Never Used  . Alcohol use No  . Drug use: No  . Sexual activity: Not Currently   Other Topics Concern  . None   Social History Narrative   Lives at home w/ his wife   Patient is right handed.   Patient has 1-2 cups of caffeine daily.     Review of Systems: A 12 point ROS discussed and pertinent positives are indicated in the HPI above.  All other systems are negative.  Review of Systems  Constitutional: Negative for activity change, appetite change and fatigue.  Respiratory: Negative for shortness of breath.   Gastrointestinal: Negative for abdominal pain.    Musculoskeletal: Positive for back pain.  Psychiatric/Behavioral: Negative for behavioral problems and confusion.    Vital Signs: BP (!) 154/86   Pulse 66   Temp 98.6 F (37 C) (Oral)   Resp 16   Ht _0  (1.702 m)   Wt 170 lb (77.1 kg)   SpO2 98%   BMI 26.63 kg/m   Physical Exam  Constitutional: He is oriented to person, place, and time. He appears well-nourished.  Cardiovascular: Normal rate and regular rhythm.   Pulmonary/Chest: Effort normal and breath sounds normal. He has no wheezes.  Abdominal: Soft. Bowel sounds are normal. There is no tenderness.  Musculoskeletal: Normal range of motion.  Neurological: He is alert and oriented to person, place, and time.  Skin: Skin is warm and dry.  Psychiatric: He has a normal mood and affect. His behavior is normal. Judgment and thought content normal.  Nursing note and vitals reviewed.   Mallampati Score:  MD Evaluation Airway: WNL Heart: WNL Abdomen: WNL Chest/ Lungs: WNL ASA  Classification: 2 Mallampati/Airway Score: Two  Imaging: Mr Lumbar Spine W Wo Contrast  Result Date: 02/22/2016 CLINICAL DATA:  Patient states he months of moderate pain and LEFT hip radiating down the lateral aspect of the LEFT thigh. EXAM: MRI LUMBAR SPINE WITHOUT AND WITH CONTRAST TECHNIQUE: Multiplanar and multiecho pulse sequences of the lumbar spine were obtained without and with intravenous contrast. CONTRAST:  31m MULTIHANCE GADOBENATE DIMEGLUMINE 529 MG/ML IV SOLN COMPARISON:  Low field strength MRI of lumbar spine performed at [facility], 04/06/2012. FINDINGS: Segmentation:  Standard. Alignment: 4 mm anterolisthesis L4-5. Trace retrolisthesis L5-S1 and L2-3. Vertebrae: No worrisome osseous lesion. Endplate reactive changes most pronounced at L5-S1. Conus medullaris: Extends to the L1 level and appears normal. Paraspinal and other soft tissues: Unremarkable. No abnormal postcontrast enhancement. Disc levels: L1-L2:  Normal. L2-L3: Central  and rightward protrusion at the interspace level disc material extends into both neural foramina, probably worse on the RIGHT. Just below the interspace, there is a large extruded fragment downward on the LEFT. Representative cross-section of 7 x 13 mm. Moderate stenosis is  compounded by posterior element hypertrophy. Severe LEFT L3 nerve root impingement behind the LEFT L3 pedicle. BILATERAL L3 and L2 nerve root impingement at the interspace level. L3-L4: Central and rightward disc extrusion. Mild stenosis. Caudally migrated free fragment. RIGHT L4 nerve root impingement is likely. L4-L5: 4 mm anterolisthesis. Advanced posterior element hypertrophy. Central and leftward extrusion. Moderate stenosis. LEFT greater than RIGHT L4 and L5 nerve root impingement. L5-S1: Disc space narrowing. Central and leftward extrusion. Posterior element hypertrophy. Mild stenosis. LEFT greater than RIGHT S1 nerve root impingement. RIGHT greater than LEFT L5 nerve root impingement. Compared with the prior scan, there is significant worsening at L2-L3. The other levels appear fairly similar. IMPRESSION: Advanced multilevel spondylosis, similar to 2012, except for the development of a large disc extrusion at L2-3, central and to the LEFT with caudally migrated fragment. LEFT L3 nerve root impingement is the dominant finding. See discussion above. Electronically Signed   By: Staci Righter M.D.   On: 02/22/2016 15:34    Labs:  CBC:  Recent Labs  06/19/15 1750 06/25/15 0723 02/22/16 1339 03/03/16 0750  WBC 9.7 6.6 8.4 13.5*  HGB 11.2* 12.3* 12.3* 13.3  HCT 34.7* 37.3* 36.9* 39.9  PLT 159 220 209 168    COAGS:  Recent Labs  05/21/15 1122 06/19/15 1750 06/21/15 0352 03/03/16 0750  INR 2.5 2.03* 1.56* 1.35    BMP:  Recent Labs  06/19/15 1750 06/25/15 0723 02/22/16 1344 03/03/16 0750  NA 139 140 139 137  K 3.6 3.7 3.8 4.3  CL 109 99* 105 98*  CO2 _0 GLUCOSE 117* 94 195* 100*  BUN _1 CALCIUM 8.5* 9.1 9.0 9.0  CREATININE 1.14 1.44* 1.44* 1.49*  GFRNONAA >60 45* 45* 44*  GFRAA >60 53* 53* 50*    LIVER FUNCTION TESTS:  Recent Labs  06/25/15 0723 02/22/16 1344  BILITOT 0.8 0.9  AST 32 36  ALT 28 39  ALKPHOS 47 44  PROT 5.8* 5.6*  ALBUMIN 3.4* 3.8    TUMOR MARKERS: No results for input([name]): AFPTM, CEA, CA199, CHROMGRNA in the last 8760 hours.  Assessment and Plan:  Scheduled for lumbar disc surgery 03/11/2016 Hx PE/DVT Coming off Eliquis for same Scheduled for retrievable inferior vena cava filter placement Risks and Benefits discussed with the patient including, but not limited to bleeding, infection, contrast induced renal failure, filter fracture or migration which can lead to emergency surgery or even death, strut penetration with damage or irritation to adjacent structures and caval thrombosis. All of the patient'[name] questions were answered, patient is agreeable to proceed. Consent signed and in chart.   Thank you for this interesting consult.  I greatly enjoyed meeting [name] and look forward to participating in their care.  A copy of this report was sent to the requesting provider on this date.  Electronically Signed: Monia Sabal A 03/03/2016, 8:45 AM   I spent a total of  30 Minutes   in face to face in clinical consultation, greater than 50% of which was counseling/coordinating care for retrievable IVC filter placement

## 2016-03-03 NOTE — Sedation Documentation (Signed)
Patient denies pain and is resting comfortably.  

## 2016-03-03 NOTE — Sedation Documentation (Signed)
Attempted to call report to short stay nurse assisting another patient will call back.

## 2016-03-04 ENCOUNTER — Encounter (HOSPITAL_COMMUNITY)
Admission: RE | Admit: 2016-03-04 | Discharge: 2016-03-04 | Disposition: A | Discharge status: Home / Self Care | Payer: Medicare Other | Source: Ambulatory Visit | Attending: Neurological Surgery | Admitting: Neurological Surgery

## 2016-03-04 ENCOUNTER — Ambulatory Visit (HOSPITAL_COMMUNITY)
Admission: RE | Admit: 2016-03-04 | Discharge: 2016-03-04 | Disposition: A | Discharge status: Home / Self Care | Payer: Medicare Other | Source: Ambulatory Visit | Attending: Neurological Surgery | Admitting: Neurological Surgery

## 2016-03-04 ENCOUNTER — Encounter (HOSPITAL_COMMUNITY): Payer: Self-pay

## 2016-03-04 DIAGNOSIS — Z01818 Encounter for other preprocedural examination: Secondary | ICD-10-CM | POA: Diagnosis not present

## 2016-03-04 DIAGNOSIS — Z01812 Encounter for preprocedural laboratory examination: Secondary | ICD-10-CM | POA: Insufficient documentation

## 2016-03-04 DIAGNOSIS — J9811 Atelectasis: Secondary | ICD-10-CM | POA: Insufficient documentation

## 2016-03-04 DIAGNOSIS — M5126 Other intervertebral disc displacement, lumbar region: Secondary | ICD-10-CM | POA: Diagnosis not present

## 2016-03-04 HISTORY — DX: Insomnia, unspecified: G47.00

## 2016-03-04 HISTORY — DX: Other skin changes: R23.8

## 2016-03-04 HISTORY — DX: Chronic kidney disease, unspecified: N18.9

## 2016-03-04 HISTORY — DX: Personal history of colonic polyps: Z86.010

## 2016-03-04 HISTORY — DX: Pneumonia, unspecified organism: J18.9

## 2016-03-04 HISTORY — DX: Personal history of colon polyps, unspecified: Z86.0100

## 2016-03-04 HISTORY — DX: Localized edema: R60.0

## 2016-03-04 HISTORY — DX: Claustrophobia: F40.240

## 2016-03-04 HISTORY — DX: Edema, unspecified: R60.9

## 2016-03-04 HISTORY — DX: Neoplasm of unspecified behavior of endocrine glands and other parts of nervous system: D49.7

## 2016-03-04 HISTORY — DX: Diverticulosis of intestine, part unspecified, without perforation or abscess without bleeding: K57.90

## 2016-03-04 HISTORY — DX: Other pulmonary embolism without acute cor pulmonale: I26.99

## 2016-03-04 HISTORY — DX: Personal history of other diseases of the respiratory system: Z87.09

## 2016-03-04 HISTORY — DX: Dorsalgia, unspecified: M54.9

## 2016-03-04 HISTORY — DX: Diarrhea, unspecified: R19.7

## 2016-03-04 HISTORY — DX: Other chronic pain: G89.29

## 2016-03-04 HISTORY — DX: Spontaneous ecchymoses: R23.3

## 2016-03-04 HISTORY — DX: Unspecified glaucoma: H40.9

## 2016-03-04 HISTORY — DX: Polyp of stomach and duodenum: K31.7

## 2016-03-04 LAB — SURGICAL PCR SCREEN
MRSA, PCR: NEGATIVE
STAPHYLOCOCCUS AUREUS: NEGATIVE

## 2016-03-04 MED ORDER — CHLORHEXIDINE GLUCONATE CLOTH 2 % EX PADS: 6.0000 | MEDICATED_PAD | Freq: Once | CUTANEOUS | Status: DC

## 2016-03-04 NOTE — Progress Notes (Addendum)
Cardiologist is Dr.Cooper with last visit in epic from 09/2015  Medical Md is Dr.Sean Loanne Drilling  Echo report in epic from 2016/2017  Multiple stress test reports in epic  Heart cath in the 80's  EKG in epic from 06-19-15  CXR last one in in Dec 2017,showed Pneumonia and treated. To repeat today

## 2016-03-04 NOTE — Progress Notes (Signed)
Verified with Lorriane Shire that pt is to hold Eliquis 5 days prior to surgery-making last dose 03/05/16.Pt was also already aware of this.

## 2016-03-04 NOTE — Pre-Procedure Instructions (Signed)
Waylyn Shingler Gorum  03/04/2016      [facility] [place], [place] [id number]  [name] [id number] Phone: [phone] Fax: [phone]  [facility] [place], [address] [place] [address] Phone: [phone] Fax: [phone]  [id number] [place], [address] [place] [address] Phone: [phone] Fax: [phone]    Your procedure is scheduled on Wed, Jan 31 @ 11:45 AM  Report to [place] at 9:45 AM  Call this number if you have problems the morning of surgery:  [phone]   Remember:  Do not eat food or drink liquids after midnight.  Take these medicines the morning of surgery with A SIP OF WATER Eye Drops,Nexium(Esomeprazole),Pain Pill(if needed),Imdur(Isosorbide),Ativan(Lorazepam),Prednisone(Deltasone),and Phenergan(Promethazine-if needed)                     No Goody's,BC's,Aleve,Advil,Motrin,Ibuprofen,Fish Oil,or any Herbal Medications.     Do not wear jewelry, make-up or nail polish.  Do not wear lotions, powders,colognes, or deoderant.  Men may shave face and neck.  Do not bring valuables to the hospital.  [facility] is not responsible for any belongings or valuables.  Contacts, dentures or bridgework may not be worn into surgery.  Leave your suitcase in the car.  After surgery it may be brought to your room.  For patients admitted to the hospital, discharge time will be determined by your treatment team.  Patients discharged the day of surgery will not be allowed to drive home.    [facility]  Before surgery, you can play an important role.  Because skin is not sterile, your skin needs to be as free of germs as possible.  You can reduce the number of germs on you skin by washing with CHG (chlorahexidine gluconate) soap before surgery.   CHG is an antiseptic cleaner which kills germs and bonds with the skin to continue killing germs even after washing.  Please DO NOT use if you have an allergy to CHG or antibacterial soaps.  If your skin becomes reddened/irritated stop using the CHG and inform your nurse when you arrive at Short Stay.  Do not shave (including legs and underarms) for at least 48 hours prior to the first CHG shower.  You may shave your face.  Please follow these instructions carefully:   1.  Shower with CHG Soap the night before surgery and the                                morning of Surgery.  2.  If you choose to wash your hair, wash your hair first as usual with your       normal shampoo.  3.  After you shampoo, rinse your hair and body thoroughly to remove the                      Shampoo.  4.  Use CHG as you would any other liquid soap.  You can apply chg directly       to the skin and wash gently with scrungie or a clean washcloth.  5.  Apply the CHG Soap to your body ONLY FROM THE NECK DOWN.        Do  not use on open wounds or open sores.  Avoid contact with your eyes,       ears, mouth and genitals (private parts).  Wash genitals (private parts)       with your normal soap.  6.  Wash thoroughly, paying special attention to the area where your surgery        will be performed.  7.  Thoroughly rinse your body with warm water from the neck down.  8.  DO NOT shower/wash with your normal soap after using and rinsing off       the CHG Soap.  9.  Pat yourself dry with a clean towel.            10.  Wear clean pajamas.            11.  Place clean sheets on your bed the night of your first shower and do not        sleep with pets.  Day of Surgery  Do not apply any lotions/deoderants the morning of surgery.  Please wear clean clothes to the hospital/surgery center.    Please read over the following fact sheets that you were given. Pain Booklet, Coughing and Deep Breathing, MRSA Information and Surgical Site  Infection Prevention

## 2016-03-06 ENCOUNTER — Encounter (HOSPITAL_COMMUNITY): Payer: Self-pay

## 2016-03-06 NOTE — Progress Notes (Addendum)
Anesthesia Chart Review:  Pt is a 76 year old male scheduled for L2-3 microdiscectomy on 03/11/2016 with Sherley Bounds, MD.   - PCP is Renato Shin, MD - Oncologist is Jerrye Noble, MD (notes in care everywhere) - Cardiologist is Sherren Mocha, MD, last office visit 10/09/15, recommended f/u in 1 year.  - Neurologist is Margette Fast, MD, last office visit 11/21/15.   PMH includes:  CAD, HTN, DM (appears to be diet controlled), hyperlipidemia, emoblic stroke (123456), carotid artery disease, PE, DVT, myasthenia gravis, pituitary tumor, CKD, CML, glaucoma, post-op N/V, GERD. Never smoker. BMI 27. S/p Lumbar discectomy 09/14/12.    Medications include: eliquis, nexium, lasix, gleevec, imdur, lisinopril, prednisone, Mestinon, Crestor. Pt to hold eliquis 5 days before surgery.   Preoperative labs reviewed.   - WBC 13.5 - glucose 100 - Cr 1.49, BUN 17; consistent with prior results - PT 16.7, will repeat DOS.   Nuclear stress test 09/27/15: Low risk stress nuclear study with normal perfusion and normal left ventricular regional and global systolic function. EF 50%  Echo 06/20/15:  - Left ventricle: The cavity size was normal. Wall thickness was increased in a pattern of mild LVH. There was mild focal basal hypertrophy of the septum. Systolic function was normal. The estimated ejection fraction was in the range of 55% to 60%. Wall motion was normal; there were no regional wall motion abnormalities. Doppler parameters are consistent with abnormal left ventricular relaxation (grade 1 diastolic dysfunction). Doppler parameters are consistent with high ventricular filling pressure. - Left atrium: The atrium was mildly dilated.  Carotid duplex 06/04/15:  - Essentially stable 40-59% right ICA stenosis. - 123456 LICA stenosis  Cardiac cath 03/16/01:  1. LM 10-20% stenosis 2. LAD has 3 diagonal branches. Scattered 20% stenosis in the proximal to mid vessel with 30% more distal stenosis followed by an area of  50-60% stenosis towards the apex. 3. CX provides one large obtuse marginal branch then has a 20% stenosis. 4. RCA dominant vessel has two branches in the posterior descending distribution. There was a 30% distal RCA stenosis and a focal 95% stenosis in the smaller of the two branches in the posterior descending territory, not amenable to PCI 5. Normal left ventricular contraction estimated at 60-65% with no significant mitral regurgitation.  Pt's WBC is slightly elevated. Given his hx CML on gleevec, I reached out to his oncologist's office.  I spoke with Foye Spurling, PA, who will reach out to pt to get further information.   Willeen Cass, FNP-BC [facility] Short Stay Surgical Center/Anesthesiology Phone: [phone] 03/06/2016 4:08 PM  Addendum:   See notes in Care everywhere from [place], [place]. She spoke with pt about elevated WBC count. Ms. Stephanie Coup suspects increased WBC due to recent increased dose of steroids pt had taken for his back. Pt ok to proceed with surgery from oncology standpoint.   If no changes, I anticipate pt can proceed with surgery as scheduled.   Willeen Cass, FNP-BC [facility] Short Stay Surgical Center/Anesthesiology Phone: [phone] 03/09/2016 12:49 PM

## 2016-03-11 ENCOUNTER — Inpatient Hospital Stay (HOSPITAL_COMMUNITY): Payer: Medicare Other

## 2016-03-11 ENCOUNTER — Ambulatory Visit (HOSPITAL_COMMUNITY)
Admission: RE | Admit: 2016-03-11 | Discharge: 2016-03-12 | Disposition: A | Discharge status: Home / Self Care | Payer: Medicare Other | Source: Ambulatory Visit | Attending: Neurological Surgery | Admitting: Neurological Surgery

## 2016-03-11 ENCOUNTER — Encounter (HOSPITAL_COMMUNITY)
Admission: RE | Disposition: A | Discharge status: Home / Self Care | Payer: Self-pay | Source: Ambulatory Visit | Attending: Neurological Surgery

## 2016-03-11 ENCOUNTER — Inpatient Hospital Stay (HOSPITAL_COMMUNITY): Payer: Medicare Other | Admitting: Emergency Medicine

## 2016-03-11 ENCOUNTER — Encounter (HOSPITAL_COMMUNITY): Payer: Self-pay | Admitting: *Deleted

## 2016-03-11 DIAGNOSIS — Z8249 Family history of ischemic heart disease and other diseases of the circulatory system: Secondary | ICD-10-CM | POA: Insufficient documentation

## 2016-03-11 DIAGNOSIS — I2510 Atherosclerotic heart disease of native coronary artery without angina pectoris: Secondary | ICD-10-CM | POA: Insufficient documentation

## 2016-03-11 DIAGNOSIS — Z86711 Personal history of pulmonary embolism: Secondary | ICD-10-CM | POA: Insufficient documentation

## 2016-03-11 DIAGNOSIS — N189 Chronic kidney disease, unspecified: Secondary | ICD-10-CM | POA: Insufficient documentation

## 2016-03-11 DIAGNOSIS — C9210 Chronic myeloid leukemia, BCR/ABL-positive, not having achieved remission: Secondary | ICD-10-CM | POA: Insufficient documentation

## 2016-03-11 DIAGNOSIS — Z79891 Long term (current) use of opiate analgesic: Secondary | ICD-10-CM | POA: Diagnosis not present

## 2016-03-11 DIAGNOSIS — E1151 Type 2 diabetes mellitus with diabetic peripheral angiopathy without gangrene: Secondary | ICD-10-CM | POA: Diagnosis not present

## 2016-03-11 DIAGNOSIS — M5416 Radiculopathy, lumbar region: Secondary | ICD-10-CM | POA: Diagnosis not present

## 2016-03-11 DIAGNOSIS — Z7901 Long term (current) use of anticoagulants: Secondary | ICD-10-CM | POA: Diagnosis not present

## 2016-03-11 DIAGNOSIS — Z86718 Personal history of other venous thrombosis and embolism: Secondary | ICD-10-CM | POA: Diagnosis not present

## 2016-03-11 DIAGNOSIS — K5790 Diverticulosis of intestine, part unspecified, without perforation or abscess without bleeding: Secondary | ICD-10-CM | POA: Diagnosis not present

## 2016-03-11 DIAGNOSIS — E782 Mixed hyperlipidemia: Secondary | ICD-10-CM | POA: Insufficient documentation

## 2016-03-11 DIAGNOSIS — M810 Age-related osteoporosis without current pathological fracture: Secondary | ICD-10-CM | POA: Diagnosis not present

## 2016-03-11 DIAGNOSIS — M5126 Other intervertebral disc displacement, lumbar region: Secondary | ICD-10-CM | POA: Diagnosis present

## 2016-03-11 DIAGNOSIS — Z888 Allergy status to other drugs, medicaments and biological substances status: Secondary | ICD-10-CM | POA: Diagnosis not present

## 2016-03-11 DIAGNOSIS — Z419 Encounter for procedure for purposes other than remedying health state, unspecified: Secondary | ICD-10-CM

## 2016-03-11 DIAGNOSIS — Z8673 Personal history of transient ischemic attack (TIA), and cerebral infarction without residual deficits: Secondary | ICD-10-CM | POA: Insufficient documentation

## 2016-03-11 DIAGNOSIS — I129 Hypertensive chronic kidney disease with stage 1 through stage 4 chronic kidney disease, or unspecified chronic kidney disease: Secondary | ICD-10-CM | POA: Diagnosis not present

## 2016-03-11 DIAGNOSIS — Z79899 Other long term (current) drug therapy: Secondary | ICD-10-CM | POA: Insufficient documentation

## 2016-03-11 DIAGNOSIS — I10 Essential (primary) hypertension: Secondary | ICD-10-CM | POA: Diagnosis not present

## 2016-03-11 DIAGNOSIS — Z7952 Long term (current) use of systemic steroids: Secondary | ICD-10-CM | POA: Insufficient documentation

## 2016-03-11 DIAGNOSIS — M5116 Intervertebral disc disorders with radiculopathy, lumbar region: Secondary | ICD-10-CM | POA: Diagnosis not present

## 2016-03-11 DIAGNOSIS — Z9889 Other specified postprocedural states: Secondary | ICD-10-CM

## 2016-03-11 DIAGNOSIS — M48061 Spinal stenosis, lumbar region without neurogenic claudication: Secondary | ICD-10-CM | POA: Diagnosis not present

## 2016-03-11 DIAGNOSIS — E1122 Type 2 diabetes mellitus with diabetic chronic kidney disease: Secondary | ICD-10-CM | POA: Insufficient documentation

## 2016-03-11 DIAGNOSIS — K219 Gastro-esophageal reflux disease without esophagitis: Secondary | ICD-10-CM | POA: Insufficient documentation

## 2016-03-11 HISTORY — PX: LUMBAR LAMINECTOMY/DECOMPRESSION MICRODISCECTOMY: SHX5026

## 2016-03-11 LAB — GLUCOSE, CAPILLARY
GLUCOSE-CAPILLARY: 106 mg/dL — AB (ref 65–99)
GLUCOSE-CAPILLARY: 127 mg/dL — AB (ref 65–99)
Glucose-Capillary: 136 mg/dL — ABNORMAL HIGH (ref 65–99)
Glucose-Capillary: 267 mg/dL — ABNORMAL HIGH (ref 65–99)

## 2016-03-11 SURGERY — LUMBAR LAMINECTOMY/DECOMPRESSION MICRODISCECTOMY 1 LEVEL
Anesthesia: General | Site: Spine Lumbar | Laterality: Left

## 2016-03-11 MED ORDER — ONDANSETRON HCL 4 MG/2ML IJ SOLN
INTRAMUSCULAR | Status: DC | PRN
  Administered 2016-03-11: 4 mg via INTRAVENOUS

## 2016-03-11 MED ORDER — ACETAMINOPHEN 325 MG PO TABS: 650.0000 mg | ORAL_TABLET | ORAL | Status: DC | PRN

## 2016-03-11 MED ORDER — MENTHOL 3 MG MT LOZG: 1.0000 | LOZENGE | OROMUCOSAL | Status: DC | PRN

## 2016-03-11 MED ORDER — SODIUM CHLORIDE 0.9% FLUSH: 3.0000 mL | INTRAVENOUS | Status: DC | PRN

## 2016-03-11 MED ORDER — SODIUM CHLORIDE 0.9% FLUSH
3.0000 mL | Freq: Two times a day (BID) | INTRAVENOUS | Status: DC
  Administered 2016-03-11: 3 mL via INTRAVENOUS

## 2016-03-11 MED ORDER — IMATINIB MESYLATE 100 MG PO TABS: 200.0000 mg | ORAL_TABLET | Freq: Every day | ORAL | Status: DC

## 2016-03-11 MED ORDER — METHOCARBAMOL 1000 MG/10ML IJ SOLN
500.0000 mg | Freq: Four times a day (QID) | INTRAVENOUS | Status: DC | PRN
  Filled 2016-03-11: qty 5

## 2016-03-11 MED ORDER — SODIUM CHLORIDE 0.9 % IV SOLN: 250.0000 mL | INTRAVENOUS | Status: DC

## 2016-03-11 MED ORDER — INSULIN ASPART 100 UNIT/ML ~~LOC~~ SOLN: 0.0000 [IU] | Freq: Three times a day (TID) | SUBCUTANEOUS | Status: DC

## 2016-03-11 MED ORDER — SUGAMMADEX SODIUM 200 MG/2ML IV SOLN
INTRAVENOUS | Status: AC
  Filled 2016-03-11: qty 2

## 2016-03-11 MED ORDER — NEOSTIGMINE METHYLSULFATE 5 MG/5ML IV SOSY
PREFILLED_SYRINGE | INTRAVENOUS | Status: AC
  Filled 2016-03-11: qty 5

## 2016-03-11 MED ORDER — PREDNISONE 5 MG PO TABS
5.0000 mg | ORAL_TABLET | Freq: Every day | ORAL | Status: DC
  Filled 2016-03-11: qty 1

## 2016-03-11 MED ORDER — LISINOPRIL 5 MG PO TABS
5.0000 mg | ORAL_TABLET | Freq: Every day | ORAL | Status: DC
  Filled 2016-03-11 (×2): qty 1

## 2016-03-11 MED ORDER — ROCURONIUM BROMIDE 100 MG/10ML IV SOLN
INTRAVENOUS | Status: DC | PRN
Start: 2016-03-11 — End: 2016-03-11
  Administered 2016-03-11: 25 mg via INTRAVENOUS

## 2016-03-11 MED ORDER — ISOSORBIDE MONONITRATE ER 30 MG PO TB24
30.0000 mg | ORAL_TABLET | Freq: Every day | ORAL | Status: DC
  Filled 2016-03-11: qty 1

## 2016-03-11 MED ORDER — CEFAZOLIN SODIUM-DEXTROSE 2-4 GM/100ML-% IV SOLN
2.0000 g | INTRAVENOUS | Status: AC
  Administered 2016-03-11: 2 g via INTRAVENOUS
  Filled 2016-03-11: qty 100

## 2016-03-11 MED ORDER — HEMOSTATIC AGENTS (NO CHARGE) OPTIME
TOPICAL | Status: DC | PRN
  Administered 2016-03-11: 1 via TOPICAL

## 2016-03-11 MED ORDER — BRIMONIDINE TARTRATE 0.2 % OP SOLN
1.0000 [drp] | Freq: Two times a day (BID) | OPHTHALMIC | Status: DC
Start: 2016-03-11 — End: 2016-03-12
  Administered 2016-03-11: 1 [drp] via OPHTHALMIC
  Filled 2016-03-11: qty 5

## 2016-03-11 MED ORDER — PHENOL 1.4 % MT LIQD
1.0000 | OROMUCOSAL | Status: DC | PRN
Start: 2016-03-11 — End: 2016-03-12

## 2016-03-11 MED ORDER — MIDAZOLAM HCL 2 MG/2ML IJ SOLN: 0.5000 mg | Freq: Once | INTRAMUSCULAR | Status: DC | PRN

## 2016-03-11 MED ORDER — NEOSTIGMINE METHYLSULFATE 10 MG/10ML IV SOLN
INTRAVENOUS | Status: DC | PRN
  Administered 2016-03-11: 5 mg via INTRAVENOUS

## 2016-03-11 MED ORDER — PYRIDOSTIGMINE BROMIDE 60 MG PO TABS
60.0000 mg | ORAL_TABLET | Freq: Every day | ORAL | Status: DC
Start: 2016-03-11 — End: 2016-03-12
  Administered 2016-03-11: 60 mg via ORAL
  Filled 2016-03-11 (×2): qty 1

## 2016-03-11 MED ORDER — SODIUM CHLORIDE 0.9 % IR SOLN
Status: DC | PRN
  Administered 2016-03-11: 13:00:00

## 2016-03-11 MED ORDER — CEFAZOLIN SODIUM-DEXTROSE 2-4 GM/100ML-% IV SOLN
2.0000 g | Freq: Three times a day (TID) | INTRAVENOUS | Status: AC
  Administered 2016-03-11 – 2016-03-12 (×2): 2 g via INTRAVENOUS
  Filled 2016-03-11 (×2): qty 100

## 2016-03-11 MED ORDER — PANTOPRAZOLE SODIUM 40 MG PO TBEC: 40.0000 mg | DELAYED_RELEASE_TABLET | Freq: Every day | ORAL | Status: DC

## 2016-03-11 MED ORDER — FUROSEMIDE 20 MG PO TABS
20.0000 mg | ORAL_TABLET | Freq: Every day | ORAL | Status: DC
  Filled 2016-03-11: qty 1

## 2016-03-11 MED ORDER — INSULIN ASPART 100 UNIT/ML ~~LOC~~ SOLN
0.0000 [IU] | Freq: Every day | SUBCUTANEOUS | Status: DC
  Administered 2016-03-11: 3 [IU] via SUBCUTANEOUS

## 2016-03-11 MED ORDER — PROPOFOL 10 MG/ML IV BOLUS
INTRAVENOUS | Status: AC
  Filled 2016-03-11: qty 20

## 2016-03-11 MED ORDER — HYDROCODONE-ACETAMINOPHEN 10-325 MG PO TABS
1.0000 | ORAL_TABLET | Freq: Four times a day (QID) | ORAL | Status: DC | PRN
Start: 2016-03-11 — End: 2016-03-11
  Administered 2016-03-11: 1 via ORAL
  Filled 2016-03-11: qty 1

## 2016-03-11 MED ORDER — DEXAMETHASONE SODIUM PHOSPHATE 10 MG/ML IJ SOLN
INTRAMUSCULAR | Status: AC
  Filled 2016-03-11: qty 1

## 2016-03-11 MED ORDER — BRIMONIDINE TARTRATE-TIMOLOL 0.2-0.5 % OP SOLN: 1.0000 [drp] | Freq: Two times a day (BID) | OPHTHALMIC | Status: DC

## 2016-03-11 MED ORDER — LIDOCAINE 2% (20 MG/ML) 5 ML SYRINGE
INTRAMUSCULAR | Status: AC
Start: 2016-03-11 — End: 2016-03-11
  Filled 2016-03-11: qty 5

## 2016-03-11 MED ORDER — IMATINIB MESYLATE 100 MG PO TABS
400.0000 mg | ORAL_TABLET | Freq: Every day | ORAL | Status: DC
  Filled 2016-03-11: qty 4

## 2016-03-11 MED ORDER — THROMBIN 5000 UNITS EX SOLR
CUTANEOUS | Status: AC
  Filled 2016-03-11: qty 15000

## 2016-03-11 MED ORDER — LACTATED RINGERS IV SOLN
INTRAVENOUS | Status: DC
  Administered 2016-03-11 (×2): via INTRAVENOUS

## 2016-03-11 MED ORDER — DEXAMETHASONE SODIUM PHOSPHATE 10 MG/ML IJ SOLN
10.0000 mg | INTRAMUSCULAR | Status: AC
  Administered 2016-03-11: 10 mg via INTRAVENOUS
  Filled 2016-03-11: qty 1

## 2016-03-11 MED ORDER — MORPHINE SULFATE (PF) 4 MG/ML IV SOLN
INTRAVENOUS | Status: AC
  Filled 2016-03-11: qty 1

## 2016-03-11 MED ORDER — INSULIN ASPART 100 UNIT/ML ~~LOC~~ SOLN
0.0000 [IU] | Freq: Three times a day (TID) | SUBCUTANEOUS | Status: DC
Start: 2016-03-11 — End: 2016-03-11

## 2016-03-11 MED ORDER — SENNA 8.6 MG PO TABS
1.0000 | ORAL_TABLET | Freq: Two times a day (BID) | ORAL | Status: DC
  Administered 2016-03-11: 8.6 mg via ORAL
  Filled 2016-03-11: qty 1

## 2016-03-11 MED ORDER — BUPIVACAINE HCL (PF) 0.25 % IJ SOLN
INTRAMUSCULAR | Status: DC | PRN
  Administered 2016-03-11: 2 mL

## 2016-03-11 MED ORDER — GLYCOPYRROLATE 0.2 MG/ML IJ SOLN
INTRAMUSCULAR | Status: DC | PRN
  Administered 2016-03-11: 0.6 mg via INTRAVENOUS

## 2016-03-11 MED ORDER — METHOCARBAMOL 500 MG PO TABS
500.0000 mg | ORAL_TABLET | Freq: Four times a day (QID) | ORAL | Status: DC | PRN
  Administered 2016-03-11 – 2016-03-12 (×2): 500 mg via ORAL
  Filled 2016-03-11 (×2): qty 1

## 2016-03-11 MED ORDER — MORPHINE SULFATE (PF) 2 MG/ML IV SOLN
1.0000 mg | INTRAVENOUS | Status: DC | PRN
Start: 2016-03-11 — End: 2016-03-11
  Administered 2016-03-11: 2 mg via INTRAVENOUS

## 2016-03-11 MED ORDER — POTASSIUM CHLORIDE IN NACL 20-0.9 MEQ/L-% IV SOLN: INTRAVENOUS | Status: DC

## 2016-03-11 MED ORDER — FENTANYL CITRATE (PF) 100 MCG/2ML IJ SOLN
INTRAMUSCULAR | Status: DC | PRN
  Administered 2016-03-11: 50 ug via INTRAVENOUS
  Administered 2016-03-11: 100 ug via INTRAVENOUS
  Administered 2016-03-11: 50 ug via INTRAVENOUS

## 2016-03-11 MED ORDER — PROPOFOL 10 MG/ML IV BOLUS
INTRAVENOUS | Status: DC | PRN
  Administered 2016-03-11: 150 mg via INTRAVENOUS

## 2016-03-11 MED ORDER — 0.9 % SODIUM CHLORIDE (POUR BTL) OPTIME
TOPICAL | Status: DC | PRN
  Administered 2016-03-11: 1000 mL

## 2016-03-11 MED ORDER — THROMBIN 5000 UNITS EX SOLR
CUTANEOUS | Status: DC | PRN
  Administered 2016-03-11 (×2): 5000 [IU] via TOPICAL

## 2016-03-11 MED ORDER — MEPERIDINE HCL 25 MG/ML IJ SOLN: 6.2500 mg | INTRAMUSCULAR | Status: DC | PRN

## 2016-03-11 MED ORDER — HYDROMORPHONE HCL 1 MG/ML IJ SOLN: 0.2500 mg | INTRAMUSCULAR | Status: DC | PRN

## 2016-03-11 MED ORDER — BUPIVACAINE HCL (PF) 0.25 % IJ SOLN
INTRAMUSCULAR | Status: AC
  Filled 2016-03-11: qty 30

## 2016-03-11 MED ORDER — FENTANYL CITRATE (PF) 100 MCG/2ML IJ SOLN
INTRAMUSCULAR | Status: AC
  Filled 2016-03-11: qty 4

## 2016-03-11 MED ORDER — THROMBIN 5000 UNITS EX SOLR
OROMUCOSAL | Status: DC | PRN
  Administered 2016-03-11: 13:00:00 via TOPICAL

## 2016-03-11 MED ORDER — MORPHINE SULFATE (PF) 4 MG/ML IV SOLN
1.0000 mg | INTRAVENOUS | Status: DC | PRN
Start: 2016-03-11 — End: 2016-03-12

## 2016-03-11 MED ORDER — TIMOLOL MALEATE 0.5 % OP SOLN
1.0000 [drp] | Freq: Two times a day (BID) | OPHTHALMIC | Status: DC
  Administered 2016-03-11: 1 [drp] via OPHTHALMIC
  Filled 2016-03-11: qty 5

## 2016-03-11 MED ORDER — LIDOCAINE HCL (CARDIAC) 20 MG/ML IV SOLN
INTRAVENOUS | Status: DC | PRN
  Administered 2016-03-11: 40 mg via INTRAVENOUS

## 2016-03-11 MED ORDER — ACETAMINOPHEN 650 MG RE SUPP: 650.0000 mg | RECTAL | Status: DC | PRN

## 2016-03-11 MED ORDER — ONDANSETRON HCL 4 MG/2ML IJ SOLN
INTRAMUSCULAR | Status: AC
  Filled 2016-03-11: qty 2

## 2016-03-11 MED ORDER — HYDROCODONE-ACETAMINOPHEN 10-325 MG PO TABS
1.0000 | ORAL_TABLET | ORAL | Status: DC | PRN
  Administered 2016-03-11 – 2016-03-12 (×3): 1 via ORAL
  Filled 2016-03-11 (×3): qty 1

## 2016-03-11 SURGICAL SUPPLY — 48 items
ADH SKN CLS APL DERMABOND .7 (GAUZE/BANDAGES/DRESSINGS) ×1
BAG DECANTER FOR FLEXI CONT (MISCELLANEOUS) ×3 IMPLANT
BENZOIN TINCTURE PRP APPL 2/3 (GAUZE/BANDAGES/DRESSINGS) ×3 IMPLANT
BUR MATCHSTICK NEURO 3.0 LAGG (BURR) ×3 IMPLANT
CANISTER SUCT 3000ML PPV (MISCELLANEOUS) ×3 IMPLANT
CARTRIDGE OIL MAESTRO DRILL (MISCELLANEOUS) ×1 IMPLANT
CLOSURE WOUND 1/2 X4 (GAUZE/BANDAGES/DRESSINGS) ×1
DERMABOND ADVANCED (GAUZE/BANDAGES/DRESSINGS) ×2
DERMABOND ADVANCED .7 DNX12 (GAUZE/BANDAGES/DRESSINGS) ×1 IMPLANT
DIFFUSER DRILL AIR PNEUMATIC (MISCELLANEOUS) ×3 IMPLANT
DRAPE LAPAROTOMY 100X72X124 (DRAPES) ×3 IMPLANT
DRAPE MICROSCOPE LEICA (MISCELLANEOUS) ×3 IMPLANT
DRAPE POUCH INSTRU U-SHP 10X18 (DRAPES) ×3 IMPLANT
DRAPE SURG 17X23 STRL (DRAPES) ×3 IMPLANT
DRSG OPSITE POSTOP 4X6 (GAUZE/BANDAGES/DRESSINGS) ×3 IMPLANT
DURAPREP 26ML APPLICATOR (WOUND CARE) ×3 IMPLANT
ELECT REM PT RETURN 9FT ADLT (ELECTROSURGICAL) ×3
ELECTRODE REM PT RTRN 9FT ADLT (ELECTROSURGICAL) ×1 IMPLANT
GAUZE SPONGE 4X4 16PLY XRAY LF (GAUZE/BANDAGES/DRESSINGS) IMPLANT
GLOVE BIO SURGEON STRL SZ8 (GLOVE) ×9 IMPLANT
GLOVE BIOGEL PI IND STRL 7.5 (GLOVE) ×2 IMPLANT
GLOVE BIOGEL PI IND STRL 8 (GLOVE) ×3 IMPLANT
GLOVE BIOGEL PI INDICATOR 7.5 (GLOVE) ×4
GLOVE BIOGEL PI INDICATOR 8 (GLOVE) ×6
GOWN STRL REUS W/ TWL LRG LVL3 (GOWN DISPOSABLE) IMPLANT
GOWN STRL REUS W/ TWL XL LVL3 (GOWN DISPOSABLE) ×3 IMPLANT
GOWN STRL REUS W/TWL 2XL LVL3 (GOWN DISPOSABLE) ×3 IMPLANT
GOWN STRL REUS W/TWL LRG LVL3 (GOWN DISPOSABLE)
GOWN STRL REUS W/TWL XL LVL3 (GOWN DISPOSABLE) ×9
HEMOSTAT POWDER KIT SURGIFOAM (HEMOSTASIS) ×3 IMPLANT
KIT BASIN OR (CUSTOM PROCEDURE TRAY) ×3 IMPLANT
KIT ROOM TURNOVER OR (KITS) ×3 IMPLANT
NEEDLE HYPO 25X1 1.5 SAFETY (NEEDLE) ×3 IMPLANT
NEEDLE SPNL 20GX3.5 QUINCKE YW (NEEDLE) ×3 IMPLANT
NS IRRIG 1000ML POUR BTL (IV SOLUTION) ×3 IMPLANT
OIL CARTRIDGE MAESTRO DRILL (MISCELLANEOUS) ×3
PACK LAMINECTOMY NEURO (CUSTOM PROCEDURE TRAY) ×3 IMPLANT
PAD ARMBOARD 7.5X6 YLW CONV (MISCELLANEOUS) ×9 IMPLANT
RUBBERBAND STERILE (MISCELLANEOUS) ×6 IMPLANT
SPONGE SURGIFOAM ABS GEL SZ50 (HEMOSTASIS) ×3 IMPLANT
STRIP CLOSURE SKIN 1/2X4 (GAUZE/BANDAGES/DRESSINGS) ×2 IMPLANT
SUT VIC AB 0 CT1 18XCR BRD8 (SUTURE) ×1 IMPLANT
SUT VIC AB 0 CT1 8-18 (SUTURE) ×3
SUT VIC AB 2-0 CP2 18 (SUTURE) ×3 IMPLANT
SUT VIC AB 3-0 SH 8-18 (SUTURE) ×3 IMPLANT
TOWEL OR 17X24 6PK STRL BLUE (TOWEL DISPOSABLE) ×3 IMPLANT
TOWEL OR 17X26 10 PK STRL BLUE (TOWEL DISPOSABLE) ×3 IMPLANT
WATER STERILE IRR 1000ML POUR (IV SOLUTION) ×3 IMPLANT

## 2016-03-11 NOTE — Op Note (Signed)
03/11/2016  1:01 PM  PATIENT:  [name]  76 y.o. 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 Saintclair Halsted  ANESTHESIA:   General  EBL: 30 ml  Total I/O In: 1000 [I.V.:1000] Out: 30 [Blood:30]  BLOOD ADMINISTERED: none  DRAINS: None  SPECIMEN:  none  INDICATION FOR PROCEDURE: This patient presented with severe left leg pain. Imaging showed large left L2-3 inferior free fragment. The patient tried conservative measures without relief. Pain was debilitating. Recommended left L2-3 microdiscectomy. Patient understood the risks, benefits, and alternatives and potential outcomes and wished to proceed.  PROCEDURE DETAILS: The patient was taken to the operating room and after induction of adequate generalized endotracheal anesthesia, the patient was rolled into the prone position on the [name] frame and all pressure points were padded. The lumbar region was cleaned and then prepped with DuraPrep and draped in the usual sterile fashion. 5 cc of local anesthesia was injected and then a dorsal midline incision was made and carried down to the lumbo sacral fascia. The fascia was opened and the paraspinous musculature was taken down in a subperiosteal fashion to expose L2-3 on the left. Intraoperative x-ray confirmed my level, and then I used a combination of the high-speed drill and the Kerrison punches to perform a hemilaminectomy, medial facetectomy, and foraminotomy at L2-3 on the left. The underlying yellow ligament was opened and removed in a piecemeal fashion to expose the underlying dura and exiting nerve root. I undercut the lateral recess and dissected down until I was medial to and distal to the pedicle. The nerve root was well decompressed. We then gently  retracted the nerve root medially with a retractor, coagulated the epidural venous vasculature, and removed several pieces of inferior free fragment with a nerve hook and a micropituitary rongeur.  I then palpated with a coronary dilator along the nerve root and into the foramen to assure adequate decompression. I felt no more compression of the nerve root. I irrigated with saline solution containing bacitracin. Achieved hemostasis with bipolar cautery, lined the dura with Gelfoam, and then closed the fascia with 0 Vicryl. I closed the subcutaneous tissues with 2-0 Vicryl and the subcuticular tissues with 3-0 Vicryl. The skin was then closed with benzoin and Steri-Strips. The drapes were removed, a sterile dressing was applied. The patient was awakened from general anesthesia and transferred to the recovery room in stable condition. At the end of the procedure all sponge, needle and instrument counts were correct.    PLAN OF CARE: Admit for overnight observation  PATIENT DISPOSITION:  PACU - hemodynamically stable.   Delay start of Pharmacological VTE agent (>24hrs) due to surgical blood loss or risk of bleeding:  yes

## 2016-03-11 NOTE — Anesthesia Procedure Notes (Signed)
Procedure Name: Intubation Date/Time: 03/11/2016 11:42 AM Performed by: Claris Che Pre-anesthesia Checklist: Patient identified, Emergency Drugs available, Suction available, Patient being monitored and Timeout performed Patient Re-evaluated:Patient Re-evaluated prior to inductionOxygen Delivery Method: Circle system utilized Preoxygenation: Pre-oxygenation with 100% oxygen Intubation Type: IV induction Laryngoscope Size: Mac and 3 Grade View: Grade II Tube type: Oral Tube size: 7.5 mm Number of attempts: 1 Airway Equipment and Method: Stylet Placement Confirmation: ETT inserted through vocal cords under direct vision,  positive ETCO2 and breath sounds checked- equal and bilateral Secured at: 23 cm Dental Injury: Teeth and Oropharynx as per pre-operative assessment

## 2016-03-11 NOTE — H&P (Signed)
Subjective: Patient is a 76 y.o. male admitted for L L2-3 HNP. Onset of symptoms was several weeks ago, gradually worsening since that time.  The pain is rated severe, unremitting, and is located at the across the lower back and radiates to LLE. The pain is described as aching and occurs all day. The symptoms have been progressive. Symptoms are exacerbated by nothing in particular. MRI or CT showed HNP L2-3 L   Past Medical History:  Diagnosis Date  . Bruises easily    d/t being on Eliquis  . Carotid artery disease ([place])    a. Duplex 05/2014: 82-64% RICA, 1-58% LICA, elevated velocities in right subclavian artery, normal left subclavian artery..  . Chronic back pain    HNP  . Chronic kidney disease (CKD)   . Claustrophobia    takes Ativan as needed  . 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 Coronary atherosclerosis of unspecified type of vessel, native or graft    a. cath in 2003 showed 10-20% LM, scattered 20% prox-mid LAD, 30% more distal LAD, 50-60% stenosis of LAD towards apex, 20% Cx, 30% dRCA, focal 95% stenosis in smaller of 2 branches of PDA, EF 60-65%.  . Diabetes mellitus ([place])   . Diarrhea    takes Imodium daily as needed  . Diverticulosis   . Diverticulosis of colon (without mention of hemorrhage)   . DJD (degenerative joint disease)   . DVT (deep venous thrombosis) ([place]) 07/2011  . Esophageal stricture   . Essential hypertension    takes Imdur and Lisinopril daily  . Gastric polyps    benign  . GERD (gastroesophageal reflux disease)    takes Nexium daily  . Glaucoma    uses eye drops  . History of bronchitis    not sure when the last time  . History of colon polyps    benign  . History of kidney stones   . History of kidney stones   . Insomnia    takes Melatonin nightly as needed  . Mixed hyperlipidemia    takes Crestor daily  . Myasthenia gravis ([place])    uses prednisone  . Osteoporosis 2016  . Peripheral edema    takes Lasix daily   . Pituitary tumor    checks Dr[facility]Walden at [facility] checks it every Jan   . Pneumonia 01/2016  . PONV (postoperative nausea and vomiting)   . Ptosis   . Pulmonary embolus ([place]) 2012  . Stroke [facility]) 06/2015    Past Surgical History:  Procedure Laterality Date  . BACK SURGERY     x2, lumbar and cervical  . CARDIAC CATHETERIZATION    . CATARACT EXTRACTION Bilateral 12/12  . COLONOSCOPY    . ESOPHAGOGASTRODUODENOSCOPY ENDOSCOPY  04/2015  . IR GENERIC HISTORICAL  03/03/2016   IR IVC FILTER PLMT / S&I [name] GUID/MOD SED 03/03/2016 [name], MD [facility]-INTERV RAD  . LITHOTRIPSY    . LUMBAR LAMINECTOMY/DECOMPRESSION MICRODISCECTOMY Right 09/14/2012   Procedure: Right Lumbar three-four, four-five, Lumbar five-Sacral one decompressive laminectomy;  Surgeon: [name], MD;  Location: [place] NEURO ORS;  Service: Neurosurgery;  Laterality: Right;  . SHOULDER SURGERY Left 05/07/2009  . TONSILLECTOMY      Prior to Admission medications   Medication Sig Start Date End Date Taking? Authorizing Provider  Blood Glucose Monitoring Suppl (ACCU-CHEK AVIVA PLUS) w/Device KIT Use to check blood sugar 1 time per day. Dx code E11.9 Patient taking differently: 1 strip by Other route See admin instructions.  Use to check blood sugar once to twice a week. Dx code E11.9 01/08/16  Yes Romero Belling, MD  brimonidine-timolol (COMBIGAN) 0.2-0.5 % ophthalmic solution Place 1 drop into both eyes every 12 (twelve) hours.   Yes Historical Provider, MD  ELIQUIS 5 MG TABS tablet TAKE 1 TABLET (5 MG TOTAL) BY MOUTH 2 (TWO) TIMES DAILY. 12/09/15  Yes York Spaniel, MD  esomeprazole (NEXIUM) 40 MG capsule Take 40 mg by mouth daily.    Yes Historical Provider, MD  furosemide (LASIX) 20 MG tablet TAKE 1 TABLET BY MOUTH DAILY. 12/23/15  Yes Tonny Bollman, MD  HYDROcodone-acetaminophen [facility]) 10-325 MG tablet Take 1 tablet by mouth every 6 (six) hours as needed (for pain.).   Yes Historical Provider, MD  imatinib (GLEEVEC) 100 MG  tablet Take 2 tablets (200 mg total) by mouth 2 (two) times daily with a meal. Take with meals and large glass of water.Caution:Chemotherapy. Patient taking differently: Take 200 mg by mouth daily with lunch. Take with meals and large glass of water.Caution:Chemotherapy. 06/27/15  Yes Evlyn Kanner Love, PA-C  isosorbide mononitrate (IMDUR) 30 MG 24 hr tablet Take 1 tablet (30 mg total) by mouth daily. 09/17/15  Yes Tonny Bollman, MD  lidocaine (LIDODERM) 5 % Place 1 patch onto the skin daily. Remove & Discard patch within 12 hours or as directed by MD 10/11/15  Yes Romero Belling, MD  lisinopril (PRINIVIL,ZESTRIL) 5 MG tablet Take 1 tablet (5 mg total) by mouth daily. 11/05/15  Yes Tonny Bollman, MD  loperamide (IMODIUM) 2 MG capsule Take 2 mg by mouth 3 (three) times daily as needed for diarrhea or loose stools. For diarrhea   Yes Historical Provider, MD  LORazepam (ATIVAN) 0.5 MG tablet Take 1 tablet (0.5 mg total) by mouth at bedtime. As needed for sleep Patient taking differently: Take 0.5 mg by mouth at bedtime as needed for sleep.  07/11/15  Yes Romero Belling, MD  Melatonin 3 MG TABS Take 3 mg by mouth at bedtime as needed (for sleep).    Yes Historical Provider, MD  Multiple Vitamin (MULTIVITAMIN) tablet Take 1 tablet by mouth daily.   Yes Historical Provider, MD  predniSONE (DELTASONE) 5 MG tablet TAKE 1 TABLET (5 MG TOTAL) BY MOUTH DAILY. 10/25/15  Yes York Spaniel, MD  pyridostigmine (MESTINON) 60 MG tablet Take 1 tablet (60 mg total) by mouth 3 (three) times daily. Patient taking differently: Take 60 mg by mouth daily.  09/03/15  Yes York Spaniel, MD  rosuvastatin (CRESTOR) 10 MG tablet TAKE 1/2 TABLETS BY MOUTH DAILY. Patient taking differently: Take 5 mg by mouth once a day 09/09/15  Yes Tonny Bollman, MD  HYDROcodone-acetaminophen (NORCO/VICODIN) 5-325 MG tablet Take 1-2 tablets by mouth every 4 (four) hours as needed. Patient taking differently: Take 1-2 tablets by mouth every 4 (four) hours  as needed (for pain.).  02/22/16   Benjiman Core, MD  NITROSTAT 0.4 MG SL tablet PLACE 1 TABLET UNDER THE TONGUE EVERY 5 MINUTES X3 DOSES AS NEEDED FOR CHEST PAIN 11/19/15   Tonny Bollman, MD  promethazine (PHENERGAN) 12.5 MG tablet Take 1 tablet (12.5 mg total) by mouth every 6 (six) hours as needed for nausea or vomiting. 06/28/15   Jacquelynn Cree, PA-C  traMADol (ULTRAM) 50 MG tablet Take 1 tablet (50 mg total) by mouth every 6 (six) hours as needed. Patient not taking: Reported on 03/02/2016 02/21/16   Romero Belling, MD   Allergies  Allergen Reactions  . Phenergan [Promethazine Hcl]  Swelling    Cannot be combined with Zofran because swelling of the eyes resulted and patient became very restless-  . Zofran [Ondansetron Hcl] Other (See Comments)    Cannot be combined with Phenergan because swelling of the eyes resulted and patient became very restless-  . Sulfamethoxazole Rash and Itching    Social History  Substance Use Topics  . Smoking status: Never Smoker  . Smokeless tobacco: Never Used  . Alcohol use No    Family History  Problem Relation Age of Onset  . Heart disease Mother   . Heart attack Mother   . Stroke Mother   . Heart disease Father   . Heart attack Father   . Stroke Father   . Breast cancer Sister     Twin   . Heart attack Sister   . Dementia Sister   . Heart disease Sister   . Heart attack Sister   . Clotting disorder Sister   . Heart attack Sister   . Colon cancer Neg Hx      Review of Systems  Positive ROS: neg  All other systems have been reviewed and were otherwise negative with the exception of those mentioned in the HPI and as above.  Objective: Vital signs in last 24 hours: Temp:  [98.5 F (36.9 C)] 98.5 F (36.9 C) (01/31 0942) Pulse Rate:  [72] 72 (01/31 0942) Resp:  [18] 18 (01/31 0942) BP: (160)/(82) 160/82 (01/31 0942) SpO2:  [100 %] 100 % (01/31 0942) Weight:  [78.3 kg (172 lb 9.6 oz)] 78.3 kg (172 lb 9.6 oz) (01/31 0942)  General  Appearance: Alert, cooperative, no distress, appears stated age Head: Normocephalic, without obvious abnormality, atraumatic Eyes: PERRL, conjunctiva/corneas clear, EOM's intact    Neck: Supple, symmetrical, trachea midline Back: Symmetric, no curvature, ROM normal, no CVA tenderness Lungs:  respirations unlabored Heart: Regular rate and rhythm Abdomen: Soft, non-tender Extremities: Extremities normal, atraumatic, no cyanosis or edema Pulses: 2+ and symmetric all extremities Skin: Skin color, texture, turgor normal, no rashes or lesions  NEUROLOGIC:   Mental status: Alert and oriented x4,  no aphasia, good attention span, fund of knowledge, and memory Motor Exam - grossly normal Sensory Exam - grossly normal Reflexes: 1= Coordination - grossly normal Gait - grossly normal Balance - grossly normal Cranial Nerves: I: smell Not tested  II: visual acuity  OS: nl    OD: nl  II: visual fields Full to confrontation  II: pupils Equal, round, reactive to light  III,VII: ptosis None  III,IV,VI: extraocular muscles  Full ROM  V: mastication Normal  V: facial light touch sensation  Normal  V,VII: corneal reflex  Present  VII: facial muscle function - upper  Normal  VII: facial muscle function - lower Normal  VIII: hearing Not tested  IX: soft palate elevation  Normal  IX,X: gag reflex Present  XI: trapezius strength  5/5  XI: sternocleidomastoid strength 5/5  XI: neck flexion strength  5/5  XII: tongue strength  Normal    Data Review Lab Results  Component Value Date   WBC 13.5 (H) 03/03/2016   HGB 13.3 03/03/2016   HCT 39.9 03/03/2016   MCV 94.8 03/03/2016   PLT 168 03/03/2016   Lab Results  Component Value Date   NA 137 03/03/2016   K 4.3 03/03/2016   CL 98 (L) 03/03/2016   CO2 30 03/03/2016   BUN 17 03/03/2016   CREATININE 1.49 (H) 03/03/2016   GLUCOSE 100 (H) 03/03/2016   Lab  Results  Component Value Date   INR 1.35 03/03/2016    Assessment/Plan: Patient  admitted for L L2-3 microdiskectomy. Patient has failed a reasonable attempt at conservative therapy.  I explained the condition and procedure to the patient and answered any questions.  Patient wishes to proceed with procedure as planned. Understands risks/ benefits and typical outcomes of procedure.   [name] S 03/11/2016 10:45 AM

## 2016-03-11 NOTE — Anesthesia Postprocedure Evaluation (Addendum)
Anesthesia Post Note  Patient: [name]  Procedure(s) Performed: Procedure(s) (LRB): Left Lumbar Two-ThreeMicrodiscectomy (Left)  Patient location during evaluation: PACU Anesthesia Type: General Level of consciousness: awake and alert, oriented and patient cooperative Pain management: pain level controlled Vital Signs Assessment: post-procedure vital signs reviewed and stable Respiratory status: spontaneous breathing, nonlabored ventilation and respiratory function stable Cardiovascular status: blood pressure returned to baseline and stable Postop Assessment: no signs of nausea or vomiting Anesthetic complications: no       Last Vitals:  Vitals:   03/11/16 1335 03/11/16 1505  BP:    Pulse:    Resp:    Temp: 36.5 C 36.6 C    Last Pain:  Vitals:   03/11/16 1350  TempSrc:   PainSc: 7                  Julie-Anne Torain,E. Adalynd Donahoe

## 2016-03-11 NOTE — Anesthesia Preprocedure Evaluation (Addendum)
Anesthesia Evaluation  Patient identified by MRN, date of birth, ID band Patient awake    Reviewed: Allergy & Precautions, NPO status , Patient's Chart, lab work & pertinent test results  History of Anesthesia Complications (+) PONV and history of anesthetic complications  Airway Mallampati: I  TM Distance: >3 FB Neck ROM: Full    Dental  (+) Chipped, Dental Advisory Given   Pulmonary neg pulmonary ROS,    breath sounds clear to auscultation       Cardiovascular hypertension, Pt. on medications + CAD (non-obstructive), + Peripheral Vascular Disease and + DVT   Rhythm:Regular Rate:Normal  '17 ECHO: EF 55-60%, valves OK 2003 cath: 10-20% LM, scattered 20% prox-mid LAD, 30% more distal LAD, 50-60% stenosis of LAD towards apex, 20% Cx, 30% dRCA, focal 95% stenosis in smaller of 2 branches of PDA, EF 60-65%.   Neuro/Psych Anxiety Chronic back pain Myasthenia gravis  Neuromuscular disease CVA    GI/Hepatic Neg liver ROS, GERD  Medicated and Controlled,  Endo/Other  diabetes (diet management)  Renal/GU Renal InsufficiencyRenal disease (creat 1.49)     Musculoskeletal Ocular Myasthenia gravis: prednisone, mestinon (did not take mestinon today)   Abdominal   Peds  Hematology  (+) Blood dyscrasia (CML), , Eliquis: last dose 1/25   Anesthesia Other Findings   Reproductive/Obstetrics                           Anesthesia Physical Anesthesia Plan  ASA: III  Anesthesia Plan: General   Post-op Pain Management:    Induction: Intravenous  Airway Management Planned: Oral ETT  Additional Equipment:   Intra-op Plan:   Post-operative Plan: Possible Post-op intubation/ventilation  Informed Consent: I have reviewed the patients History and Physical, chart, labs and discussed the procedure including the risks, benefits and alternatives for the proposed anesthesia with the patient or authorized  representative who has indicated his/her understanding and acceptance.   Dental advisory given  Plan Discussed with: CRNA and Surgeon  Anesthesia Plan Comments: (Plan routine monitors, GETA Possible post op ventilation if weakness persists since myasthenic)        Anesthesia Quick Evaluation

## 2016-03-11 NOTE — Transfer of Care (Signed)
Immediate Anesthesia Transfer of Care Note  Patient: [name]  Procedure(s) Performed: Procedure(s): Left Lumbar Two-ThreeMicrodiscectomy (Left)  Patient Location: PACU  Anesthesia Type:General  Level of Consciousness: awake, alert , oriented and patient cooperative  Airway & Oxygen Therapy: Patient Spontanous Breathing and Patient connected to nasal cannula oxygen  Post-op Assessment: Report given to RN and Post -op Vital signs reviewed and stable  Post vital signs: Reviewed  Last Vitals:  Vitals:   03/11/16 0942  BP: (!) 160/82  Pulse: 72  Resp: 18  Temp: 36.9 C    Last Pain:  Vitals:   03/11/16 0942  TempSrc: Oral      Patients Stated Pain Goal: 4 ([phone])  Complications: No apparent anesthesia complications

## 2016-03-12 ENCOUNTER — Ambulatory Visit: Payer: Medicare Other | Admitting: Gastroenterology

## 2016-03-12 ENCOUNTER — Encounter (HOSPITAL_COMMUNITY): Payer: Self-pay | Admitting: Neurological Surgery

## 2016-03-12 DIAGNOSIS — I2510 Atherosclerotic heart disease of native coronary artery without angina pectoris: Secondary | ICD-10-CM | POA: Diagnosis not present

## 2016-03-12 DIAGNOSIS — N189 Chronic kidney disease, unspecified: Secondary | ICD-10-CM | POA: Diagnosis not present

## 2016-03-12 DIAGNOSIS — Z86718 Personal history of other venous thrombosis and embolism: Secondary | ICD-10-CM | POA: Diagnosis not present

## 2016-03-12 DIAGNOSIS — M5116 Intervertebral disc disorders with radiculopathy, lumbar region: Secondary | ICD-10-CM | POA: Diagnosis not present

## 2016-03-12 DIAGNOSIS — Z8673 Personal history of transient ischemic attack (TIA), and cerebral infarction without residual deficits: Secondary | ICD-10-CM | POA: Diagnosis not present

## 2016-03-12 DIAGNOSIS — E1151 Type 2 diabetes mellitus with diabetic peripheral angiopathy without gangrene: Secondary | ICD-10-CM | POA: Diagnosis not present

## 2016-03-12 LAB — GLUCOSE, CAPILLARY: GLUCOSE-CAPILLARY: 151 mg/dL — AB (ref 65–99)

## 2016-03-12 MED ORDER — HYDROCODONE-ACETAMINOPHEN 10-325 MG PO TABS: 1.0000 | ORAL_TABLET | Freq: Four times a day (QID) | ORAL | 0 refills | Status: DC | PRN

## 2016-03-12 NOTE — Progress Notes (Signed)
Patient alert and oriented, mae's well, voiding adequate amount of urine, swallowing without difficulty, c/o mild pain and medication given prior to discharge. Patient discharged home with family. Script and discharged instructions given to patient. Patient and family stated understanding of d/c instructions given and has an appointment with MD.

## 2016-03-12 NOTE — Discharge Summary (Signed)
Physician Discharge Summary  Patient ID: [name] MRN: [id number] DOB/AGE: [birth year] 76 y.o.  Admit date: 03/11/2016 Discharge date: 03/12/2016  Admission Diagnoses: HNP L2-3    Discharge Diagnoses: same   Discharged Condition: good  Hospital Course: The patient was admitted on 03/11/2016 and taken to the operating room where the patient underwent microdiskectomy. The patient tolerated the procedure well and was taken to the recovery room and then to the floor in stable condition. The hospital course was routine. There were no complications. The wound remained clean dry and intact. Pt had appropriate back soreness. No complaints of leg pain or new N/T/W. The patient remained afebrile with stable vital signs, and tolerated a regular diet. The patient continued to increase activities, and pain was well controlled with oral pain medications.   Consults: None  Significant Diagnostic Studies:  Results for orders placed or performed during the hospital encounter of 03/11/16  Glucose, capillary  Result Value Ref Range   Glucose-Capillary 106 (H) 65 - 99 mg/dL  Glucose, capillary  Result Value Ref Range   Glucose-Capillary 127 (H) 65 - 99 mg/dL  Glucose, capillary  Result Value Ref Range   Glucose-Capillary 136 (H) 65 - 99 mg/dL  Glucose, capillary  Result Value Ref Range   Glucose-Capillary 267 (H) 65 - 99 mg/dL   Comment 1 Notify RN    Comment 2 Document in Chart   Glucose, capillary  Result Value Ref Range   Glucose-Capillary 151 (H) 65 - 99 mg/dL   Comment 1 Document in Chart     Chest 2 View  Result Date: 03/04/2016 CLINICAL DATA:  Lumbar fusion. EXAM: CHEST  2 VIEW COMPARISON:  01/10/2016. FINDINGS: Mediastinum and hilar structures are normal. Mild right middle lobe subsegmental atelectasis. No pleural effusion or pneumothorax. No acute bony abnormality . IMPRESSION: Mild right middle lobe subsegmental atelectasis. Exam is otherwise unremarkable . Electronically Signed    By: Marcello Moores  Register   On: 03/04/2016 11:39   Dg Lumbar Spine 2-3 Views  Result Date: 03/11/2016 CLINICAL DATA:  L2-3 micro discectomy. EXAM: LUMBAR SPINE - 2-3 VIEW COMPARISON:  MRI of February 22, 2016. FINDINGS: Two intraoperative cross-table lateral projections were obtained of the lumbar spine. The first image demonstrates surgical probe projected over posterior spinous process of L2. The second image demonstrates surgical probe projected over posterior interspinous space of L2-3. IMPRESSION: Surgical localization as described above. Electronically Signed   By: Marijo Conception, M.D.   On: 03/11/2016 13:58   Mr Lumbar Spine W Wo Contrast  Result Date: 02/22/2016 CLINICAL DATA:  Patient states he months of moderate pain and LEFT hip radiating down the lateral aspect of the LEFT thigh. EXAM: MRI LUMBAR SPINE WITHOUT AND WITH CONTRAST TECHNIQUE: Multiplanar and multiecho pulse sequences of the lumbar spine were obtained without and with intravenous contrast. CONTRAST:  33m MULTIHANCE GADOBENATE DIMEGLUMINE 529 MG/ML IV SOLN COMPARISON:  Low field strength MRI of lumbar spine performed at [facility], 04/06/2012. FINDINGS: Segmentation:  Standard. Alignment: 4 mm anterolisthesis L4-5. Trace retrolisthesis L5-S1 and L2-3. Vertebrae: No worrisome osseous lesion. Endplate reactive changes most pronounced at L5-S1. Conus medullaris: Extends to the L1 level and appears normal. Paraspinal and other soft tissues: Unremarkable. No abnormal postcontrast enhancement. Disc levels: L1-L2:  Normal. L2-L3: Central and rightward protrusion at the interspace level disc material extends into both neural foramina, probably worse on the RIGHT. Just below the interspace, there is a large extruded fragment downward on the LEFT. Representative cross-section of 7 x  13 mm. Moderate stenosis is compounded by posterior element hypertrophy. Severe LEFT L3 nerve root impingement behind the LEFT L3 pedicle. BILATERAL L3 and L2 nerve  root impingement at the interspace level. L3-L4: Central and rightward disc extrusion. Mild stenosis. Caudally migrated free fragment. RIGHT L4 nerve root impingement is likely. L4-L5: 4 mm anterolisthesis. Advanced posterior element hypertrophy. Central and leftward extrusion. Moderate stenosis. LEFT greater than RIGHT L4 and L5 nerve root impingement. L5-S1: Disc space narrowing. Central and leftward extrusion. Posterior element hypertrophy. Mild stenosis. LEFT greater than RIGHT S1 nerve root impingement. RIGHT greater than LEFT L5 nerve root impingement. Compared with the prior scan, there is significant worsening at L2-L3. The other levels appear fairly similar. IMPRESSION: Advanced multilevel spondylosis, similar to 2012, except for the development of a large disc extrusion at L2-3, central and to the LEFT with caudally migrated fragment. LEFT L3 nerve root impingement is the dominant finding. See discussion above. Electronically Signed   By: Staci Righter M.D.   On: 02/22/2016 15:34   Ir Ivc Filter Plmt / S&i /img Guid/mod Sed  Result Date: 03/03/2016 INDICATION: History of prior DVT and pulmonary emboli, on chronic Eliquis. Temporary IVC filter insertion preoperatively prior to lumbar microdiskectomy scheduled for 03/11/2016 EXAM: ULTRASOUND GUIDANCE FOR VASCULAR ACCESS IVC CATHETERIZATION AND VENOGRAM IVC FILTER INSERTION Date:  1/23/20181/23/2018 9:48 am Radiologist:  M. Daryll Brod, MD Guidance:  Ultrasound and fluoroscopic CONTRAST:  35 cc Isovue 370 MEDICATIONS: None. ANESTHESIA/SEDATION: 1.0 mg IV Versed; 50 mcg IV Fentanyl Moderate Sedation Time:  15 minutes The patient was continuously monitored during the procedure by the interventional radiology nurse under my direct supervision. FLUOROSCOPY TIME:  Fluoroscopy Time: 1 minutes 6 seconds (27 mGy). COMPLICATIONS: None immediate. PROCEDURE: Informed consent was obtained from the patient following explanation of the procedure, risks, benefits and  alternatives. The patient understands, agrees and consents for the procedure. All questions were addressed. A time out was performed. Maximal barrier sterile technique utilized including caps, mask, sterile gowns, sterile gloves, large sterile drape, hand hygiene, and betadine prep. Under sterile condition and local anesthesia, right internal jugular venous access was performed with ultrasound. Images obtained for documentation. Over a guide wire, the IVC filter delivery sheath and inner dilator were advanced into the IVC just above the IVC bifurcation. Contrast injection was performed for an IVC venogram. IVC VENOGRAM: The IVC is patent. No evidence of thrombus, stenosis, or occlusion. No variant venous anatomy. The renal veins are identified at L1-2. IVC FILTER INSERTION: Through the delivery sheath, the Bard Denali IVC filter was deployed in the infrarenal IVC at the L2-3 level just below the renal veins and above the IVC bifurcation. Contrast injection confirmed position. There is good apposition of the filter against the IVC. The delivery sheath was removed and hemostasis was obtained with compression for 5 minutes. The patient tolerated the procedure well. No immediate complications. IMPRESSION: Ultrasound and fluoroscopically guided infrarenal IVC filter insertion. PLAN: This IVC filter is potentially retrievable. The patient will be assessed for filter retrieval by Interventional Radiology in approximately 8-12 weeks. Further recommendations regarding filter retrieval, continued surveillance or declaration of device permanence, will be made at that time. Electronically Signed   By: Jerilynn Mages.  Shick M.D.   On: 03/03/2016 10:07    Antibiotics:  Anti-infectives    Start     Dose/Rate Route Frequency Ordered Stop   03/11/16 2000  ceFAZolin (ANCEF) IVPB 2g/100 mL premix     2 g 200 mL/hr over 30 Minutes Intravenous Every  8 hours 03/11/16 1558 03/12/16 0424   03/11/16 1237  bacitracin 50,000 Units in sodium  chloride irrigation 0.9 % 500 mL irrigation  Status:  Discontinued       As needed 03/11/16 1237 03/11/16 1328   03/11/16 0954  ceFAZolin (ANCEF) IVPB 2g/100 mL premix     2 g 200 mL/hr over 30 Minutes Intravenous On call to O.R. 03/11/16 0954 03/11/16 1210      Discharge Exam: Blood pressure (!) 156/86, pulse 75, temperature 98.5 F (36.9 C), resp. rate 18, height 5' 7"  (1.702 m), weight 78.3 kg (172 lb 9.6 oz), SpO2 100 %. Neurologic: Grossly normal Dressing dry  Discharge Medications:   Allergies as of 03/12/2016      Reactions   Phenergan [promethazine Hcl] Swelling   Cannot be combined with Zofran because swelling of the eyes resulted and patient became very restless-   Zofran [ondansetron Hcl] Other (See Comments)   Cannot be combined with Phenergan because swelling of the eyes resulted and patient became very restless-   Sulfamethoxazole Rash, Itching      Medication List    TAKE these medications   ACCU-CHEK AVIVA PLUS w/Device Kit Use to check blood sugar 1 time per day. Dx code E11.9 What changed:  how much to take  how to take this  when to take this  additional instructions   COMBIGAN 0.2-0.5 % ophthalmic solution Generic drug:  brimonidine-timolol Place 1 drop into both eyes every 12 (twelve) hours.   ELIQUIS 5 MG Tabs tablet Generic drug:  apixaban TAKE 1 TABLET (5 MG TOTAL) BY MOUTH 2 (TWO) TIMES DAILY.   esomeprazole 40 MG capsule Commonly known as:  NEXIUM Take 40 mg by mouth daily.   furosemide 20 MG tablet Commonly known as:  LASIX TAKE 1 TABLET BY MOUTH DAILY.   HYDROcodone-acetaminophen 10-325 MG tablet Commonly known as:  NORCO Take 1 tablet by mouth every 6 (six) hours as needed (for pain.). What changed:  Another medication with the same name was removed. Continue taking this medication, and follow the directions you see here.   imatinib 100 MG tablet Commonly known as:  GLEEVEC Take 2 tablets (200 mg total) by mouth 2 (two) times  daily with a meal. Take with meals and large glass of water.Caution:Chemotherapy. What changed:  when to take this  additional instructions   isosorbide mononitrate 30 MG 24 hr tablet Commonly known as:  IMDUR Take 1 tablet (30 mg total) by mouth daily.   lidocaine 5 % Commonly known as:  LIDODERM Place 1 patch onto the skin daily. Remove & Discard patch within 12 hours or as directed by MD   lisinopril 5 MG tablet Commonly known as:  PRINIVIL,ZESTRIL Take 1 tablet (5 mg total) by mouth daily.   loperamide 2 MG capsule Commonly known as:  IMODIUM Take 2 mg by mouth 3 (three) times daily as needed for diarrhea or loose stools. For diarrhea   LORazepam 0.5 MG tablet Commonly known as:  ATIVAN Take 1 tablet (0.5 mg total) by mouth at bedtime. As needed for sleep What changed:  when to take this  reasons to take this  additional instructions   Melatonin 3 MG Tabs Take 3 mg by mouth at bedtime as needed (for sleep).   multivitamin tablet Take 1 tablet by mouth daily.   NITROSTAT 0.4 MG SL tablet Generic drug:  nitroGLYCERIN PLACE 1 TABLET UNDER THE TONGUE EVERY 5 MINUTES X3 DOSES AS NEEDED FOR CHEST PAIN  predniSONE 5 MG tablet Commonly known as:  DELTASONE TAKE 1 TABLET (5 MG TOTAL) BY MOUTH DAILY.   promethazine 12.5 MG tablet Commonly known as:  PHENERGAN Take 1 tablet (12.5 mg total) by mouth every 6 (six) hours as needed for nausea or vomiting.   pyridostigmine 60 MG tablet Commonly known as:  MESTINON Take 1 tablet (60 mg total) by mouth 3 (three) times daily. What changed:  when to take this   rosuvastatin 10 MG tablet Commonly known as:  CRESTOR TAKE 1/2 TABLETS BY MOUTH DAILY. What changed:  See the new instructions.   traMADol 50 MG tablet Commonly known as:  ULTRAM Take 1 tablet (50 mg total) by mouth every 6 (six) hours as needed.       Disposition: home   Final Dx: microdiskectomy  Discharge Instructions     Remove dressing in 72  hours    Complete by:  As directed    Call MD for:  difficulty breathing, headache or visual disturbances    Complete by:  As directed    Call MD for:  persistant nausea and vomiting    Complete by:  As directed    Call MD for:  redness, tenderness, or signs of infection (pain, swelling, redness, odor or green/yellow discharge around incision site)    Complete by:  As directed    Call MD for:  severe uncontrolled pain    Complete by:  As directed    Call MD for:  temperature >100.4    Complete by:  As directed    Diet - low sodium heart healthy    Complete by:  As directed    Increase activity slowly    Complete by:  As directed          Signed: Rosilyn Coachman S 03/12/2016, 9:36 AM

## 2016-03-25 ENCOUNTER — Other Ambulatory Visit: Payer: Self-pay | Admitting: Neurological Surgery

## 2016-03-25 DIAGNOSIS — I2601 Septic pulmonary embolism with acute cor pulmonale: Secondary | ICD-10-CM

## 2016-04-01 ENCOUNTER — Ambulatory Visit: Payer: Medicare Other | Admitting: Gastroenterology

## 2016-04-09 ENCOUNTER — Ambulatory Visit
Admission: RE | Admit: 2016-04-09 | Discharge: 2016-04-09 | Disposition: A | Discharge status: Home / Self Care | Payer: Medicare Other | Source: Ambulatory Visit | Attending: Neurological Surgery | Admitting: Neurological Surgery

## 2016-04-09 ENCOUNTER — Other Ambulatory Visit (HOSPITAL_COMMUNITY): Payer: Self-pay | Admitting: Interventional Radiology

## 2016-04-09 DIAGNOSIS — I2601 Septic pulmonary embolism with acute cor pulmonale: Secondary | ICD-10-CM

## 2016-04-09 DIAGNOSIS — Z86718 Personal history of other venous thrombosis and embolism: Secondary | ICD-10-CM

## 2016-04-09 HISTORY — PX: IR GENERIC HISTORICAL: IMG1180011

## 2016-04-09 NOTE — Progress Notes (Signed)
Chief Complaint: Patient was seen in consultation today for  Chief Complaint  Patient presents with  . Follow-up    follow up IVC filter/to schedule for retrieval   at the request of Sherley Bounds S  Referring Physician(s): [name]  Supervising Physician: Daryll Brod  History of Present Illness: [name] is a 76 y.o. male with history of PE/DVT in 2013  He takes Eliquis  He recently underwent L2-3 microdiscectomy and required IVC filter placement so he could hold his Eliquis for the procedure.  The filter was placed by Dr. Annamaria Boots on 03/03/2016  He is here today to schedule retrieval.  He is doing well and has recovered well from his surgery.  He denies any recent fever/chills or illness.   Past Medical History:  Diagnosis Date  . Bruises easily    d/t being on Eliquis  . Carotid artery disease ([place])    a. Duplex 05/2014: 51-76% RICA, 1-60% LICA, elevated velocities in right subclavian artery, normal left subclavian artery..  . Chronic back pain    HNP  . Chronic kidney disease (CKD)   . Claustrophobia    takes Ativan as needed  . 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 Coronary atherosclerosis of unspecified type of vessel, native or graft    a. cath in 2003 showed 10-20% LM, scattered 20% prox-mid LAD, 30% more distal LAD, 50-60% stenosis of LAD towards apex, 20% Cx, 30% dRCA, focal 95% stenosis in smaller of 2 branches of PDA, EF 60-65%.  . Diabetes mellitus ([place])   . Diarrhea    takes Imodium daily as needed  . Diverticulosis   . Diverticulosis of colon (without mention of hemorrhage)   . DJD (degenerative joint disease)   . DVT (deep venous thrombosis) ([place]) 07/2011  . Esophageal stricture   . Essential hypertension    takes Imdur and Lisinopril daily  . Gastric polyps    benign  . GERD (gastroesophageal reflux disease)    takes Nexium daily  . Glaucoma    uses eye drops  . History of bronchitis    not sure when  the last time  . History of colon polyps    benign  . History of kidney stones   . History of kidney stones   . Insomnia    takes Melatonin nightly as needed  . Mixed hyperlipidemia    takes Crestor daily  . Myasthenia gravis ([place])    uses prednisone  . Osteoporosis 2016  . Peripheral edema    takes Lasix daily  . Pituitary tumor    checks Dr[facility]Walden at [facility] checks it every Jan   . Pneumonia 01/2016  . PONV (postoperative nausea and vomiting)   . Ptosis   . Pulmonary embolus ([place]) 2012  . Stroke [facility]) 06/2015    Past Surgical History:  Procedure Laterality Date  . BACK SURGERY     x2, lumbar and cervical  . CARDIAC CATHETERIZATION    . CATARACT EXTRACTION Bilateral 12/12  . COLONOSCOPY    . ESOPHAGOGASTRODUODENOSCOPY ENDOSCOPY  04/2015  . IR GENERIC HISTORICAL  03/03/2016   IR IVC FILTER PLMT / S&I [name] GUID/MOD SED 03/03/2016 [name], MD [facility]-INTERV RAD  . LITHOTRIPSY    . LUMBAR LAMINECTOMY/DECOMPRESSION MICRODISCECTOMY Right 09/14/2012   Procedure: Right Lumbar three-four, four-five, Lumbar five-Sacral one decompressive laminectomy;  Surgeon: [name], MD;  Location: [facility];  Service: Neurosurgery;  Laterality: Right;  . LUMBAR LAMINECTOMY/DECOMPRESSION MICRODISCECTOMY  Left 03/11/2016   Procedure: Left Lumbar Two-ThreeMicrodiscectomy;  Surgeon: [name], MD;  Location: [place];  Service: Neurosurgery;  Laterality: Left;  . SHOULDER SURGERY Left 05/07/2009  . TONSILLECTOMY      Allergies: Phenergan [promethazine hcl]; Zofran [ondansetron hcl]; and Sulfamethoxazole  Medications: Prior to Admission medications   Medication Sig Start Date End Date Taking? Authorizing Provider  Blood Glucose Monitoring Suppl (ACCU-CHEK AVIVA PLUS) w/Device KIT Use to check blood sugar 1 time per day. Dx code E11.9 Patient taking differently: 1 strip by Other route See admin instructions. Use to check blood sugar once to twice a week. Dx code E11.9 01/08/16   Renato Shin,  MD  brimonidine-timolol (COMBIGAN) 0.2-0.5 % ophthalmic solution Place 1 drop into both eyes every 12 (twelve) hours.    Historical Provider, MD  ELIQUIS 5 MG TABS tablet TAKE 1 TABLET (5 MG TOTAL) BY MOUTH 2 (TWO) TIMES DAILY. 12/09/15   Kathrynn Ducking, MD  esomeprazole (NEXIUM) 40 MG capsule Take 40 mg by mouth daily.     Historical Provider, MD  furosemide (LASIX) 20 MG tablet TAKE 1 TABLET BY MOUTH DAILY. 12/23/15   Sherren Mocha, MD  HYDROcodone-acetaminophen [facility]) 10-325 MG tablet Take 1 tablet by mouth every 6 (six) hours as needed (for pain.). 03/12/16   [name], MD  imatinib (GLEEVEC) 100 MG tablet Take 2 tablets (200 mg total) by mouth 2 (two) times daily with a meal. Take with meals and large glass of water.Caution:Chemotherapy. Patient taking differently: Take 200 mg by mouth daily with lunch. Take with meals and large glass of water.Caution:Chemotherapy. 06/27/15   Bary Leriche, PA-C  isosorbide mononitrate (IMDUR) 30 MG 24 hr tablet Take 1 tablet (30 mg total) by mouth daily. 09/17/15   Sherren Mocha, MD  lidocaine (LIDODERM) 5 % Place 1 patch onto the skin daily. Remove & Discard patch within 12 hours or as directed by MD 10/11/15   Renato Shin, MD  lisinopril (PRINIVIL,ZESTRIL) 5 MG tablet Take 1 tablet (5 mg total) by mouth daily. 11/05/15   Sherren Mocha, MD  loperamide (IMODIUM) 2 MG capsule Take 2 mg by mouth 3 (three) times daily as needed for diarrhea or loose stools. For diarrhea    Historical Provider, MD  LORazepam (ATIVAN) 0.5 MG tablet Take 1 tablet (0.5 mg total) by mouth at bedtime. As needed for sleep Patient taking differently: Take 0.5 mg by mouth at bedtime as needed for sleep.  07/11/15   Renato Shin, MD  Melatonin 3 MG TABS Take 3 mg by mouth at bedtime as needed (for sleep).     Historical Provider, MD  Multiple Vitamin (MULTIVITAMIN) tablet Take 1 tablet by mouth daily.    Historical Provider, MD  NITROSTAT 0.4 MG SL tablet PLACE 1 TABLET UNDER THE TONGUE  EVERY 5 MINUTES X3 DOSES AS NEEDED FOR CHEST PAIN 11/19/15   Sherren Mocha, MD  predniSONE (DELTASONE) 5 MG tablet TAKE 1 TABLET (5 MG TOTAL) BY MOUTH DAILY. 10/25/15   Kathrynn Ducking, MD  promethazine (PHENERGAN) 12.5 MG tablet Take 1 tablet (12.5 mg total) by mouth every 6 (six) hours as needed for nausea or vomiting. 06/28/15   Bary Leriche, PA-C  pyridostigmine (MESTINON) 60 MG tablet Take 1 tablet (60 mg total) by mouth 3 (three) times daily. Patient taking differently: Take 60 mg by mouth daily.  09/03/15   Kathrynn Ducking, MD  rosuvastatin (CRESTOR) 10 MG tablet TAKE 1/2 TABLETS BY MOUTH DAILY. Patient taking  differently: Take 5 mg by mouth once a day 09/09/15   Sherren Mocha, MD  traMADol (ULTRAM) 50 MG tablet Take 1 tablet (50 mg total) by mouth every 6 (six) hours as needed. Patient not taking: Reported on 03/02/2016 02/21/16   Renato Shin, MD     Family History  Problem Relation Age of Onset  . Heart disease Mother   . Heart attack Mother   . Stroke Mother   . Heart disease Father   . Heart attack Father   . Stroke Father   . Breast cancer Sister     Twin   . Heart attack Sister   . Dementia Sister   . Heart disease Sister   . Heart attack Sister   . Clotting disorder Sister   . Heart attack Sister   . Colon cancer Neg Hx     Social History   Social History  . Marital status: Married    Spouse name: Hassan Rowan  . Number of children: 2  . Years of education: 1-College   Occupational History  . retired Retired    Retired   Social History Main Topics  . Smoking status: Never Smoker  . Smokeless tobacco: Never Used  . Alcohol use No  . Drug use: No  . Sexual activity: Not Currently   Other Topics Concern  . Not on file   Social History Narrative   Lives at home w/ his wife   Patient is right handed.   Patient has 1-2 cups of caffeine daily.    Review of Systems: A 12 point ROS discussed Review of Systems  Constitutional: Negative.   HENT: Positive for  dental problem.   Respiratory: Negative.   Cardiovascular: Negative.   Gastrointestinal: Negative.   Genitourinary: Negative.   Musculoskeletal: Negative.   Skin: Negative.   Neurological: Negative.   Hematological: Negative.   Psychiatric/Behavioral: Negative.     Vital Signs: BP 127/69 (BP Location: Left Arm, Patient Position: Sitting, Cuff Size: Normal)   Pulse 74   Temp 98.2 F (36.8 C) (Oral)   Resp 14   Ht 5' 7" (1.702 m)   Wt 173 lb (78.5 kg)   SpO2 99%   BMI 27.10 kg/m   Physical Exam  Constitutional: He is oriented to person, place, and time. He appears well-developed.  HENT:  Head: Normocephalic and atraumatic.  Eyes: EOM are normal.  Neck: Normal range of motion.  Cardiovascular: Normal rate, regular rhythm and normal heart sounds.   Pulmonary/Chest: Effort normal and breath sounds normal. No respiratory distress. He has no wheezes.  Abdominal: Soft. He exhibits no distension.  Musculoskeletal: Normal range of motion.  Neurological: He is alert and oriented to person, place, and time.  Skin: Skin is warm and dry.  Psychiatric: He has a normal mood and affect. His behavior is normal. Judgment and thought content normal.  Vitals reviewed.    Imaging: Dg Lumbar Spine 2-3 Views  Result Date: 03/11/2016 CLINICAL DATA:  L2-3 micro discectomy. EXAM: LUMBAR SPINE - 2-3 VIEW COMPARISON:  MRI of February 22, 2016. FINDINGS: Two intraoperative cross-table lateral projections were obtained of the lumbar spine. The first image demonstrates surgical probe projected over posterior spinous process of L2. The second image demonstrates surgical probe projected over posterior interspinous space of L2-3. IMPRESSION: Surgical localization as described above. Electronically Signed   By: Marijo Conception, M.D.   On: 03/11/2016 13:58   US Venous Img Lower Bilateral  Result Date: 04/09/2016 CLINICAL DATA:  Remote  history of DVT and pulmonary emboli. Patient is being seen as an  outpatient for possible filter retrieval. He is now 2 months status post lumbar microdiskectomy. EXAM: BILATERAL LOWER EXTREMITY VENOUS DOPPLER ULTRASOUND TECHNIQUE: Gray-scale sonography with graded compression, as well as color Doppler and duplex ultrasound were performed to evaluate the lower extremity deep venous systems from the level of the common femoral vein and including the common femoral, femoral, profunda femoral, popliteal and calf veins including the posterior tibial, peroneal and gastrocnemius veins when visible. The superficial great saphenous vein was also interrogated. Spectral Doppler was utilized to evaluate flow at rest and with distal augmentation maneuvers in the common femoral, femoral and popliteal veins. COMPARISON:  None. FINDINGS: RIGHT LOWER EXTREMITY Common Femoral Vein: No evidence of thrombus. Normal compressibility, respiratory phasicity and response to augmentation. Saphenofemoral Junction: No evidence of thrombus. Normal compressibility and flow on color Doppler imaging. Profunda Femoral Vein: No evidence of thrombus. Normal compressibility and flow on color Doppler imaging. Femoral Vein: No evidence of thrombus. Normal compressibility, respiratory phasicity and response to augmentation. Popliteal Vein: No evidence of thrombus. Normal compressibility, respiratory phasicity and response to augmentation. Calf Veins: No evidence of thrombus. Normal compressibility and flow on color Doppler imaging. Superficial Great Saphenous Vein: No evidence of thrombus. Normal compressibility and flow on color Doppler imaging. Venous Reflux:  None. Other Findings:  None. LEFT LOWER EXTREMITY Common Femoral Vein: No evidence of thrombus. Normal compressibility, respiratory phasicity and response to augmentation. Saphenofemoral Junction: No evidence of thrombus. Normal compressibility and flow on color Doppler imaging. Profunda Femoral Vein: No evidence of thrombus. Normal compressibility and flow on  color Doppler imaging. Femoral Vein: No evidence of thrombus. Normal compressibility, respiratory phasicity and response to augmentation. Popliteal Vein: No evidence of thrombus. Normal compressibility, respiratory phasicity and response to augmentation. Calf Veins: No evidence of thrombus. Normal compressibility and flow on color Doppler imaging. Superficial Great Saphenous Vein: No evidence of thrombus. Normal compressibility and flow on color Doppler imaging. Venous Reflux:  None. Other Findings:  None. IMPRESSION: No evidence of deep venous thrombosis. Electronically Signed   By: Jerilynn Mages.  Shick M.D.   On: 04/09/2016 11:09    Labs:  CBC:  Recent Labs  06/19/15 1750 06/25/15 0723 02/22/16 1339 03/03/16 0750  WBC 9.7 6.6 8.4 13.5*  HGB 11.2* 12.3* 12.3* 13.3  HCT 34.7* 37.3* 36.9* 39.9  PLT 159 220 209 168    COAGS:  Recent Labs  05/21/15 1122 06/19/15 1750 06/21/15 0352 03/03/16 0750  INR 2.5 2.03* 1.56* 1.35    BMP:  Recent Labs  06/19/15 1750 06/25/15 0723 02/22/16 1344 03/03/16 0750  NA 139 140 139 137  K 3.6 3.7 3.8 4.3  CL 109 99* 105 98*  CO2 _0 GLUCOSE 117* 94 195* 100*  BUN _1 CALCIUM 8.5* 9.1 9.0 9.0  CREATININE 1.14 1.44* 1.44* 1.49*  GFRNONAA >60 45* 45* 44*  GFRAA >60 53* 53* 50*    LIVER FUNCTION TESTS:  Recent Labs  06/25/15 0723 02/22/16 1344  BILITOT 0.8 0.9  AST 32 36  ALT 28 39  ALKPHOS 47 44  PROT 5.8* 5.6*  ALBUMIN 3.4* 3.8    TUMOR MARKERS: No results for input(s): AFPTM, CEA, CA199, CHROMGRNA in the last 8760 hours.  Assessment:  History of DVT/PE in 2013, on Eliquis daily  IVC filter placed by Dr. Annamaria Boots on 03/03/2016 so he could undergo spine surgery and safely hold Eliquis.  Venous dopplers done  today are negative for DVT  Will plan retrieval of Filter in the next couple of weeks. Patient prefers to wait until after March 13th and requests procedure be done at [facility].  Risks and Benefits  discussed with the patient including, but not limited to bleeding, infection, contrast induced renal failure, filter fracture or migration which can lead to emergency surgery or even death, strut penetration with damage or irritation to adjacent structures and caval thrombosis.  All of the patient's questions were answered, patient is agreeable to proceed.   Electronically Signed: Murrell Redden PA-C 04/09/2016, 11:19 AM   Please refer to Dr. Fritz Pickerel attestation of this note for management and plan.

## 2016-04-17 ENCOUNTER — Encounter: Payer: Self-pay | Admitting: Interventional Radiology

## 2016-04-28 DIAGNOSIS — M25559 Pain in unspecified hip: Secondary | ICD-10-CM | POA: Diagnosis not present

## 2016-04-28 DIAGNOSIS — C9211 Chronic myeloid leukemia, BCR/ABL-positive, in remission: Secondary | ICD-10-CM | POA: Diagnosis not present

## 2016-04-28 DIAGNOSIS — K222 Esophageal obstruction: Secondary | ICD-10-CM | POA: Diagnosis not present

## 2016-04-28 DIAGNOSIS — H401131 Primary open-angle glaucoma, bilateral, mild stage: Secondary | ICD-10-CM | POA: Diagnosis not present

## 2016-04-28 DIAGNOSIS — Z86711 Personal history of pulmonary embolism: Secondary | ICD-10-CM | POA: Diagnosis not present

## 2016-04-28 DIAGNOSIS — Z7952 Long term (current) use of systemic steroids: Secondary | ICD-10-CM | POA: Diagnosis not present

## 2016-04-28 DIAGNOSIS — Z8673 Personal history of transient ischemic attack (TIA), and cerebral infarction without residual deficits: Secondary | ICD-10-CM | POA: Diagnosis not present

## 2016-04-28 DIAGNOSIS — D539 Nutritional anemia, unspecified: Secondary | ICD-10-CM | POA: Diagnosis not present

## 2016-04-28 DIAGNOSIS — G7000 Myasthenia gravis without (acute) exacerbation: Secondary | ICD-10-CM | POA: Diagnosis not present

## 2016-04-28 DIAGNOSIS — Z9889 Other specified postprocedural states: Secondary | ICD-10-CM | POA: Diagnosis not present

## 2016-04-28 DIAGNOSIS — K118 Other diseases of salivary glands: Secondary | ICD-10-CM | POA: Diagnosis not present

## 2016-04-28 DIAGNOSIS — M810 Age-related osteoporosis without current pathological fracture: Secondary | ICD-10-CM | POA: Diagnosis not present

## 2016-04-28 DIAGNOSIS — Z7901 Long term (current) use of anticoagulants: Secondary | ICD-10-CM | POA: Diagnosis not present

## 2016-04-28 DIAGNOSIS — Z79899 Other long term (current) drug therapy: Secondary | ICD-10-CM | POA: Diagnosis not present

## 2016-04-29 ENCOUNTER — Other Ambulatory Visit: Payer: Self-pay | Admitting: Radiology

## 2016-04-30 ENCOUNTER — Ambulatory Visit: Payer: Self-pay | Admitting: Pharmacist

## 2016-04-30 ENCOUNTER — Encounter (HOSPITAL_COMMUNITY): Payer: Self-pay

## 2016-04-30 ENCOUNTER — Ambulatory Visit (HOSPITAL_COMMUNITY)
Admission: RE | Admit: 2016-04-30 | Discharge: 2016-04-30 | Disposition: A | Discharge status: Home / Self Care | Payer: Medicare Other | Source: Ambulatory Visit | Attending: Interventional Radiology | Admitting: Interventional Radiology

## 2016-04-30 DIAGNOSIS — Z86711 Personal history of pulmonary embolism: Secondary | ICD-10-CM

## 2016-04-30 DIAGNOSIS — G7000 Myasthenia gravis without (acute) exacerbation: Secondary | ICD-10-CM | POA: Insufficient documentation

## 2016-04-30 DIAGNOSIS — K219 Gastro-esophageal reflux disease without esophagitis: Secondary | ICD-10-CM | POA: Diagnosis not present

## 2016-04-30 DIAGNOSIS — M1990 Unspecified osteoarthritis, unspecified site: Secondary | ICD-10-CM | POA: Insufficient documentation

## 2016-04-30 DIAGNOSIS — H409 Unspecified glaucoma: Secondary | ICD-10-CM | POA: Insufficient documentation

## 2016-04-30 DIAGNOSIS — G8929 Other chronic pain: Secondary | ICD-10-CM | POA: Insufficient documentation

## 2016-04-30 DIAGNOSIS — Z7952 Long term (current) use of systemic steroids: Secondary | ICD-10-CM | POA: Diagnosis not present

## 2016-04-30 DIAGNOSIS — Z7901 Long term (current) use of anticoagulants: Secondary | ICD-10-CM | POA: Insufficient documentation

## 2016-04-30 DIAGNOSIS — E1122 Type 2 diabetes mellitus with diabetic chronic kidney disease: Secondary | ICD-10-CM | POA: Diagnosis not present

## 2016-04-30 DIAGNOSIS — M549 Dorsalgia, unspecified: Secondary | ICD-10-CM | POA: Diagnosis not present

## 2016-04-30 DIAGNOSIS — N189 Chronic kidney disease, unspecified: Secondary | ICD-10-CM | POA: Insufficient documentation

## 2016-04-30 DIAGNOSIS — I2510 Atherosclerotic heart disease of native coronary artery without angina pectoris: Secondary | ICD-10-CM | POA: Diagnosis not present

## 2016-04-30 DIAGNOSIS — E782 Mixed hyperlipidemia: Secondary | ICD-10-CM | POA: Diagnosis not present

## 2016-04-30 DIAGNOSIS — Z86718 Personal history of other venous thrombosis and embolism: Secondary | ICD-10-CM

## 2016-04-30 DIAGNOSIS — G4700 Insomnia, unspecified: Secondary | ICD-10-CM | POA: Insufficient documentation

## 2016-04-30 DIAGNOSIS — I129 Hypertensive chronic kidney disease with stage 1 through stage 4 chronic kidney disease, or unspecified chronic kidney disease: Secondary | ICD-10-CM | POA: Diagnosis not present

## 2016-04-30 DIAGNOSIS — Z882 Allergy status to sulfonamides status: Secondary | ICD-10-CM | POA: Insufficient documentation

## 2016-04-30 DIAGNOSIS — Z8679 Personal history of other diseases of the circulatory system: Secondary | ICD-10-CM | POA: Diagnosis not present

## 2016-04-30 DIAGNOSIS — Z4689 Encounter for fitting and adjustment of other specified devices: Secondary | ICD-10-CM | POA: Diagnosis not present

## 2016-04-30 DIAGNOSIS — M810 Age-related osteoporosis without current pathological fracture: Secondary | ICD-10-CM | POA: Insufficient documentation

## 2016-04-30 DIAGNOSIS — Z5181 Encounter for therapeutic drug level monitoring: Secondary | ICD-10-CM

## 2016-04-30 DIAGNOSIS — Z8673 Personal history of transient ischemic attack (TIA), and cerebral infarction without residual deficits: Secondary | ICD-10-CM | POA: Insufficient documentation

## 2016-04-30 HISTORY — PX: IR GENERIC HISTORICAL: IMG1180011

## 2016-04-30 LAB — BASIC METABOLIC PANEL
ANION GAP: 8 (ref 5–15)
BUN: 12 mg/dL (ref 6–20)
CALCIUM: 8.9 mg/dL (ref 8.9–10.3)
CO2: 29 mmol/L (ref 22–32)
CREATININE: 1.35 mg/dL — AB (ref 0.61–1.24)
Chloride: 103 mmol/L (ref 101–111)
GFR calc non Af Amer: 49 mL/min — ABNORMAL LOW (ref >60)
GFR, EST AFRICAN AMERICAN: 56 mL/min — AB (ref >60)
Glucose, Bld: 90 mg/dL (ref 65–99)
Potassium: 3.9 mmol/L (ref 3.5–5.1)
SODIUM: 140 mmol/L (ref 135–145)

## 2016-04-30 LAB — CBC
HCT: 35.6 % — ABNORMAL LOW (ref 39.0–52.0)
HEMOGLOBIN: 11.9 g/dL — AB (ref 13.0–17.0)
MCH: 31.6 pg (ref 26.0–34.0)
MCHC: 33.4 g/dL (ref 30.0–36.0)
MCV: 94.4 fL (ref 78.0–100.0)
PLATELETS: 231 10*3/uL (ref 150–400)
RBC: 3.77 MIL/uL — ABNORMAL LOW (ref 4.22–5.81)
RDW: 15.5 % (ref 11.5–15.5)
WBC: 7.8 10*3/uL (ref 4.0–10.5)

## 2016-04-30 LAB — PROTIME-INR
INR: 1.26
PROTHROMBIN TIME: 15.9 s — AB (ref 11.4–15.2)

## 2016-04-30 LAB — GLUCOSE, CAPILLARY: Glucose-Capillary: 91 mg/dL (ref 65–99)

## 2016-04-30 MED ORDER — IOPAMIDOL (ISOVUE-300) INJECTION 61%
50.0000 mL | Freq: Once | INTRAVENOUS | Status: AC | PRN
  Administered 2016-04-30: 15 mL via INTRAVENOUS

## 2016-04-30 MED ORDER — IOPAMIDOL (ISOVUE-300) INJECTION 61%
INTRAVENOUS | Status: AC
  Administered 2016-04-30: 30 mL via INTRAVENOUS
  Filled 2016-04-30: qty 100

## 2016-04-30 MED ORDER — MIDAZOLAM HCL 2 MG/2ML IJ SOLN
INTRAMUSCULAR | Status: AC
  Filled 2016-04-30: qty 6

## 2016-04-30 MED ORDER — MIDAZOLAM HCL 2 MG/2ML IJ SOLN
INTRAMUSCULAR | Status: AC | PRN
  Administered 2016-04-30 (×2): 1 mg via INTRAVENOUS

## 2016-04-30 MED ORDER — FENTANYL CITRATE (PF) 100 MCG/2ML IJ SOLN
INTRAMUSCULAR | Status: AC | PRN
  Administered 2016-04-30 (×2): 25 ug via INTRAVENOUS

## 2016-04-30 MED ORDER — LIDOCAINE HCL 1 % IJ SOLN
INTRAMUSCULAR | Status: AC | PRN
  Administered 2016-04-30: 5 mL via INTRADERMAL

## 2016-04-30 MED ORDER — FENTANYL CITRATE (PF) 100 MCG/2ML IJ SOLN
INTRAMUSCULAR | Status: AC
  Filled 2016-04-30: qty 2

## 2016-04-30 MED ORDER — LIDOCAINE HCL 1 % IJ SOLN
INTRAMUSCULAR | Status: AC
  Filled 2016-04-30: qty 20

## 2016-04-30 MED ORDER — IOPAMIDOL (ISOVUE-300) INJECTION 61%
100.0000 mL | Freq: Once | INTRAVENOUS | Status: AC | PRN
  Administered 2016-04-30: 30 mL via INTRAVENOUS

## 2016-04-30 MED ORDER — SODIUM CHLORIDE 0.9 % IV SOLN
INTRAVENOUS | Status: DC
  Administered 2016-04-30: 10:00:00 via INTRAVENOUS

## 2016-04-30 MED ORDER — IOPAMIDOL (ISOVUE-300) INJECTION 61%
INTRAVENOUS | Status: AC
  Administered 2016-04-30: 15 mL via INTRAVENOUS
  Filled 2016-04-30: qty 50

## 2016-04-30 NOTE — Sedation Documentation (Signed)
New IV started.

## 2016-04-30 NOTE — Procedures (Signed)
s/p IVC FILTER RETRIEVAL  EBL 0 No comp Stable Full report in PACS

## 2016-04-30 NOTE — Discharge Instructions (Addendum)
Moderate Conscious Sedation, Adult, Care After °These instructions provide you with information about caring for yourself after your procedure. Your health care provider may also give you more specific instructions. Your treatment has been planned according to current medical practices, but problems sometimes occur. Call your health care provider if you have any problems or questions after your procedure. °What can I expect after the procedure? °After your procedure, it is common: °· To feel sleepy for several hours. °· To feel clumsy and have poor balance for several hours. °· To have poor judgment for several hours. °· To vomit if you eat too soon. °Follow these instructions at home: °For at least 24 hours after the procedure:  ° °· Do not: °¨ Participate in activities where you could fall or become injured. °¨ Drive. °¨ Use heavy machinery. °¨ Drink alcohol. °¨ Take sleeping pills or medicines that cause drowsiness. °¨ Make important decisions or sign legal documents. °¨ Take care of children on your own. °· Rest. °Eating and drinking  °· Follow the diet recommended by your health care provider. °· If you vomit: °¨ Drink water, juice, or soup when you can drink without vomiting. °¨ Make sure you have little or no nausea before eating solid foods. °General instructions  °· Have a responsible adult stay with you until you are awake and alert. °· Take over-the-counter and prescription medicines only as told by your health care provider. °· If you smoke, do not smoke without supervision. °· Keep all follow-up visits as told by your health care provider. This is important. °Contact a health care provider if: °· You keep feeling nauseous or you keep vomiting. °· You feel light-headed. °· You develop a rash. °· You have a fever. °Get help right away if: °· You have trouble breathing. °This information is not intended to replace advice given to you by your health care provider. Make sure you discuss any questions you  have with your health care provider. °Document Released: 11/16/2012 Document Revised: 07/01/2015 Document Reviewed: 05/18/2015 °Elsevier Interactive Patient Education © 2017 [employer] ° °

## 2016-04-30 NOTE — Sedation Documentation (Signed)
Patient is resting comfortably. 

## 2016-04-30 NOTE — H&P (Signed)
Referring Physician(s): Shick,Michael  Supervising Physician: Daryll Brod  Patient Status:  [facility]  Chief Complaint: "I'm getting my filter out"   Subjective: Patient familiar to IR service from prior IVC filter placement on 03/03/16. He was seen in follow-up at the IR clinic by Dr. Annamaria Boots to evaluate for filter removal on 04/09/16. He had a history of PE/DVT in 2013 and was prescribed eliquis . An IVC filter was requested prior to his L2-3 microdiscectomy in January of this year while off eliquis. He has done well since his surgery and is back on eliquis. Lower extremity venous Dopplers 04/09/16 were negative for DVT. He was deemed an appropriate candidate for filter removal and presents today for the procedure. He currently denies fever, headache, chest pain, dyspnea, cough, abdominal pain, nausea, vomiting or abnormal bleeding. He does have some mild back pain. Past Medical History:  Diagnosis Date  . Bruises easily    d/t being on Eliquis  . Carotid artery disease ([place])    a. Duplex 05/2014: 54-00% RICA, 8-67% LICA, elevated velocities in right subclavian artery, normal left subclavian artery..  . Chronic back pain    HNP  . Chronic kidney disease (CKD)   . Claustrophobia    takes Ativan as needed  . 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 Coronary atherosclerosis of unspecified type of vessel, native or graft    a. cath in 2003 showed 10-20% LM, scattered 20% prox-mid LAD, 30% more distal LAD, 50-60% stenosis of LAD towards apex, 20% Cx, 30% dRCA, focal 95% stenosis in smaller of 2 branches of PDA, EF 60-65%.  . Diabetes mellitus ([place])   . Diarrhea    takes Imodium daily as needed  . Diverticulosis   . Diverticulosis of colon (without mention of hemorrhage)   . DJD (degenerative joint disease)   . DVT (deep venous thrombosis) ([place]) 07/2011  . Esophageal stricture   . Essential hypertension    takes Imdur and Lisinopril daily  . Gastric polyps    benign  . GERD (gastroesophageal reflux disease)    takes Nexium daily  . Glaucoma    uses eye drops  . History of bronchitis    not sure when the last time  . History of colon polyps    benign  . History of kidney stones   . History of kidney stones   . Insomnia    takes Melatonin nightly as needed  . Mixed hyperlipidemia    takes Crestor daily  . Myasthenia gravis ([place])    uses prednisone  . Osteoporosis 2016  . Peripheral edema    takes Lasix daily  . Pituitary tumor    checks Dr[facility]Walden at [facility] checks it every Jan   . Pneumonia 01/2016  . PONV (postoperative nausea and vomiting)   . Ptosis   . Pulmonary embolus ([place]) 2012  . Stroke [facility]) 06/2015   Past Surgical History:  Procedure Laterality Date  . BACK SURGERY     x2, lumbar and cervical  . CARDIAC CATHETERIZATION    . CATARACT EXTRACTION Bilateral 12/12  . COLONOSCOPY    . ESOPHAGOGASTRODUODENOSCOPY ENDOSCOPY  04/2015  . IR GENERIC HISTORICAL  03/03/2016   IR IVC FILTER PLMT / S&I [name] GUID/MOD SED 03/03/2016 [name], MD [facility] RAD  . IR GENERIC HISTORICAL  04/09/2016   IR RADIOLOGIST EVAL & MGMT 04/09/2016 [name], MD [facility] INTER[facility] RAD  . LITHOTRIPSY    . LUMBAR LAMINECTOMY/DECOMPRESSION MICRODISCECTOMY Right  09/14/2012   Procedure: Right Lumbar three-four, four-five, Lumbar five-Sacral one decompressive laminectomy;  Surgeon: [name], MD;  Location: [facility];  Service: Neurosurgery;  Laterality: Right;  . LUMBAR LAMINECTOMY/DECOMPRESSION MICRODISCECTOMY Left 03/11/2016   Procedure: Left Lumbar Two-ThreeMicrodiscectomy;  Surgeon: [name], MD;  Location: [place];  Service: Neurosurgery;  Laterality: Left;  . SHOULDER SURGERY Left 05/07/2009  . TONSILLECTOMY        Allergies: Phenergan [promethazine hcl]; Zofran [ondansetron hcl]; and Sulfamethoxazole  Medications: Prior to Admission medications   Medication Sig Start Date End Date Taking? Authorizing Provider  Blood Glucose  Monitoring Suppl (ACCU-CHEK AVIVA PLUS) w/Device KIT Use to check blood sugar 1 time per day. Dx code E11.9 Patient taking differently: 1 strip by Other route See admin instructions. Use to check blood sugar once to twice a week. Dx code E11.9 01/08/16  Yes Renato Shin, MD  brimonidine-timolol (COMBIGAN) 0.2-0.5 % ophthalmic solution Place 1 drop into both eyes every 12 (twelve) hours.   Yes Historical Provider, MD  ELIQUIS 5 MG TABS tablet TAKE 1 TABLET (5 MG TOTAL) BY MOUTH 2 (TWO) TIMES DAILY. 12/09/15  Yes Kathrynn Ducking, MD  esomeprazole (NEXIUM) 40 MG capsule Take 40 mg by mouth daily.    Yes Historical Provider, MD  furosemide (LASIX) 20 MG tablet TAKE 1 TABLET BY MOUTH DAILY. 12/23/15  Yes Sherren Mocha, MD  HYDROcodone-acetaminophen [facility]) 10-325 MG tablet Take 1 tablet by mouth every 6 (six) hours as needed (for pain.). 03/12/16  Yes [name], MD  imatinib (GLEEVEC) 100 MG tablet Take 2 tablets (200 mg total) by mouth 2 (two) times daily with a meal. Take with meals and large glass of water.Caution:Chemotherapy. Patient taking differently: Take 200 mg by mouth daily with lunch. Take with meals and large glass of water.Caution:Chemotherapy. 06/27/15  Yes Ivan Anchors Love, PA-C  isosorbide mononitrate (IMDUR) 30 MG 24 hr tablet Take 1 tablet (30 mg total) by mouth daily. 09/17/15  Yes Sherren Mocha, MD  lidocaine (LIDODERM) 5 % Place 1 patch onto the skin daily. Remove & Discard patch within 12 hours or as directed by MD 10/11/15  Yes Renato Shin, MD  lisinopril (PRINIVIL,ZESTRIL) 5 MG tablet Take 1 tablet (5 mg total) by mouth daily. 11/05/15  Yes Sherren Mocha, MD  loperamide (IMODIUM) 2 MG capsule Take 2 mg by mouth 3 (three) times daily as needed for diarrhea or loose stools. For diarrhea   Yes Historical Provider, MD  LORazepam (ATIVAN) 0.5 MG tablet Take 1 tablet (0.5 mg total) by mouth at bedtime. As needed for sleep Patient taking differently: Take 0.5 mg by mouth at bedtime as needed  for sleep.  07/11/15  Yes Renato Shin, MD  Melatonin 3 MG TABS Take 3 mg by mouth at bedtime as needed (for sleep).    Yes Historical Provider, MD  Multiple Vitamin (MULTIVITAMIN) tablet Take 1 tablet by mouth daily.   Yes Historical Provider, MD  predniSONE (DELTASONE) 5 MG tablet TAKE 1 TABLET (5 MG TOTAL) BY MOUTH DAILY. 10/25/15  Yes Kathrynn Ducking, MD  pyridostigmine (MESTINON) 60 MG tablet Take 1 tablet (60 mg total) by mouth 3 (three) times daily. Patient taking differently: Take 60 mg by mouth daily.  09/03/15  Yes Kathrynn Ducking, MD  rosuvastatin (CRESTOR) 10 MG tablet TAKE 1/2 TABLETS BY MOUTH DAILY. Patient taking differently: Take 5 mg by mouth once a day 09/09/15  Yes Sherren Mocha, MD  NITROSTAT 0.4 MG SL tablet PLACE  1 TABLET UNDER THE TONGUE EVERY 5 MINUTES X3 DOSES AS NEEDED FOR CHEST PAIN 11/19/15   Sherren Mocha, MD  promethazine (PHENERGAN) 12.5 MG tablet Take 1 tablet (12.5 mg total) by mouth every 6 (six) hours as needed for nausea or vomiting. 06/28/15   Bary Leriche, PA-C  traMADol (ULTRAM) 50 MG tablet Take 1 tablet (50 mg total) by mouth every 6 (six) hours as needed. Patient not taking: Reported on 03/02/2016 02/21/16   Renato Shin, MD     Vital Signs: BP (!) 148/70 (BP Location: Left Arm)   Pulse 76   Temp 98.7 F (37.1 C) (Oral)   Resp 16   Ht 5' 7"  (1.702 m)   Wt 170 lb (77.1 kg)   SpO2 100%   BMI 26.63 kg/m   Physical Exam awake, alert. Chest clear to auscultation bilaterally. Heart with regular rate and rhythm. Abdomen soft, positive bowel sounds, nontender. Lower extremities with trace to 1+ edema bilaterally.  Imaging: No results found.  Labs:  CBC:  Recent Labs  06/25/15 0723 02/22/16 1339 03/03/16 0750 04/30/16 0959  WBC 6.6 8.4 13.5* 7.8  HGB 12.3* 12.3* 13.3 11.9*  HCT 37.3* 36.9* 39.9 35.6*  PLT 220 209 168 231    COAGS:  Recent Labs  05/21/15 1122 06/19/15 1750 06/21/15 0352 03/03/16 0750  INR 2.5 2.03* 1.56* 1.35     BMP:  Recent Labs  06/19/15 1750 06/25/15 0723 02/22/16 1344 03/03/16 0750  NA 139 140 139 137  K 3.6 3.7 3.8 4.3  CL 109 99* 105 98*  CO2 23 28 25 30   GLUCOSE 117* 94 195* 100*  BUN 6 10 14 17   CALCIUM 8.5* 9.1 9.0 9.0  CREATININE 1.14 1.44* 1.44* 1.49*  GFRNONAA >60 45* 45* 44*  GFRAA >60 53* 53* 50*    LIVER FUNCTION TESTS:  Recent Labs  06/25/15 0723 02/22/16 1344  BILITOT 0.8 0.9  AST 32 36  ALT 28 39  ALKPHOS 47 44  PROT 5.8* 5.6*  ALBUMIN 3.4* 3.8    Assessment and Plan: Pt with history of PE/DVT in 2013 - prescribed eliquis . An IVC filter was requested prior to  L2-3 microdiscectomy in January of this year while off eliquis and placed on 03/03/16.  He has done well since his surgery and is back on eliquis. Lower extremity venous Dopplers 04/09/16 were negative for DVT. He was deemed an appropriate candidate for filter removal and presents today for the procedure. Details/risks of procedure, including but not limited to, internal bleeding, infection, contrast nephropathy, inability to remove filter, injury to adjacent structures discussed with patient/ spouse with their understanding and consent.   Electronically Signed: D. Rowe Robert 04/30/2016, 10:29 AM   I spent a total of  20 minutes at the the patient's bedside AND on the patient's hospital floor or unit, greater than 50% of which was counseling/coordinating care for IVC filter removal

## 2016-05-07 ENCOUNTER — Ambulatory Visit (INDEPENDENT_AMBULATORY_CARE_PROVIDER_SITE_OTHER): Payer: Medicare Other | Admitting: Gastroenterology

## 2016-05-07 ENCOUNTER — Encounter: Payer: Self-pay | Admitting: Gastroenterology

## 2016-05-07 VITALS — BP 140/64 | HR 80 | Ht 65.75 in | Wt 176.0 lb

## 2016-05-07 DIAGNOSIS — R1310 Dysphagia, unspecified: Secondary | ICD-10-CM

## 2016-05-07 DIAGNOSIS — R110 Nausea: Secondary | ICD-10-CM | POA: Diagnosis not present

## 2016-05-07 DIAGNOSIS — D132 Benign neoplasm of duodenum: Secondary | ICD-10-CM | POA: Diagnosis not present

## 2016-05-07 DIAGNOSIS — R1013 Epigastric pain: Secondary | ICD-10-CM | POA: Diagnosis not present

## 2016-05-07 MED ORDER — ONDANSETRON 8 MG PO TBDP
8.0000 mg | ORAL_TABLET | Freq: Three times a day (TID) | ORAL | 1 refills | Status: DC | PRN
Start: 2016-05-07 — End: 2017-07-30

## 2016-05-07 NOTE — Patient Instructions (Addendum)
If you are age 78 or older, your body mass index should be between 23-30. Your Body mass index is 28.62 kg/m. If this is out of the aforementioned range listed, please consider follow up with your Primary Care Provider.  If you are age 65 or younger, your body mass index should be between 19-25. Your Body mass index is 28.62 kg/m. If this is out of the aformentioned range listed, please consider follow up with your Primary Care Provider.   We have sent the following medications to your pharmacy for you to pick up at your convenience:  Zofran  You have been given samples of FDgard and a coupon. This medication is over the counter. Take the medications as the instructions state on the box.  Please follow up with Dr. Havery Moros in 6 months. You will be contacted by letter when it is time to schedule. Thank you.

## 2016-05-07 NOTE — Progress Notes (Signed)
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   He has chronic dysphagia. He had an EGD 04/23/15 - normal esophagus without pathology to cause dysphagia. Benign stomach polyps. 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 He was offered a manometry procedure and declined. Barium study shows nonspecific dysmotility. He has a history of Myasthenia gravis. These symptoms are chronic. He reports mild dysphagia at this time, not bothering him significantly.    He is on Gleevac for history of CML and has been on this for several years. He reports some poor appetite and chronic nausea since being on this regimen. He has occasional nausea / vomiting. He has been on Gleevac since 2004. He takes nexium once daily and takes phenergan PRN. The phenergan makes him sleepy. He had a remote gastric emtying study in 2007 showing delayed gastric emptying. He does have early satiety at times. He has some occasional postprandial abdominal discomfort in his upper abdomen. He was given a trial of erythromycin but due to concern for worsening MG he stopped it. He does not want to try Reglan given potential neurologic side effects.   He otherwise continues to have periodic diarrhea, using immodium. He takes this and it works okay for the most part. He tested negative for C Diff 12/26. No fevers.   Last colonoscopy 12/26/2013 - diverticulosis, otherwise normal  He otherwise reports he had back surgery and had IVC filter placed, and then had a tooth surgery within the past few months. He wants to avoid any other procedures at this time if possible.     Past Medical History:  Diagnosis Date  . Bruises easily    d/t being on  Eliquis  . Carotid artery disease ([place]    a. Duplex 05/2014: 410-27%RICA, 12-53%LICA, elevated velocities in right subclavian artery, normal left subclavian artery..  . Chronic back pain    HNP  . Chronic kidney disease (CKD)   . Claustrophobia    takes Ativan as needed  . C[place] (chronic myeloid leukemia) ([place]    takes Gleevec daily  . Coronary atherosclerosis of unspecified type of vessel, native or graft    a. cath in 2003 showed 10-20% LM, scattered 20% prox-mid LAD, 30% more distal LAD, 50-60% stenosis of LAD towards apex, 20% Cx, 30% dRCA, focal 95% stenosis in smaller of 2 branches of PDA, EF 60-65%.  . Diabetes mellitus ([place]   . Diarrhea    takes Imodium daily as needed  . Diverticulosis   . Diverticulosis of colon (without mention of hemorrhage)   . DJD (degenerative joint disease)   . DVT (deep venous thrombosis) ([place] 07/2011  . Esophageal stricture   . Essential hypertension    takes Imdur and Lisinopril daily  . Gastric polyps    benign  . GERD (gastroesophageal reflux disease)    takes Nexium daily  . Glaucoma    uses eye drops  . History of bronchitis    not sure when the last time  . History of colon polyps    benign  . History of kidney stones   . History of kidney stones   . Insomnia    takes Melatonin nightly as needed  .  Mixed hyperlipidemia    takes Crestor daily  . Myasthenia gravis (HCC)    uses prednisone  . Osteoporosis 2016  . Peripheral edema    takes Lasix daily  . Pituitary tumor    checks Dr.Walden at [facility] checks it every Jan   . Pneumonia 01/2016  . PONV (postoperative nausea and vomiting)   . Ptosis   . Pulmonary embolus (HCC) 2012  . Stroke [facility]) 06/2015     Past Surgical History:  Procedure Laterality Date  . BACK SURGERY     x2, lumbar and cervical  . CARDIAC CATHETERIZATION    . CATARACT EXTRACTION Bilateral 12/12  . COLONOSCOPY    . ESOPHAGOGASTRODUODENOSCOPY ENDOSCOPY  04/2015  . IR GENERIC HISTORICAL  03/03/2016   IR  IVC FILTER PLMT / S&I Lenise Arena GUID/MOD SED 03/03/2016 Berdine Dance, MD [facility]INTER[facility]  . IR GENERIC HISTORICAL  04/09/2016   IR RADIOLOGIST EVAL & MGMT 04/09/2016 Berdine Dance, MD [facility]  . IR GENERIC HISTORICAL  04/30/2016   IR IVC FILTER RETRIEVAL / S&I Lenise Arena GUID/MOD SED 04/30/2016 Berdine Dance, MD [facility]-INTER[facility] RAD  . LITHOTRIPSY    . LUMBAR LAMINECTOMY/DECOMPRESSION MICRODISCECTOMY Right 09/14/2012   Procedure: Right Lumbar three-four, four-five, Lumbar five-Sacral one decompressive laminectomy;  Surgeon: Tia Alert, MD;  Location: [facility]UR[facility] OR[facility];  Service: Neurosurgery;  Laterality: Right;  . LUMBAR LAMINECTOMY/DECOMPRESSION MICRODISCECTOMY Left 03/11/2016   Procedure: Left Lumbar Two-ThreeMicrodiscectomy;  Surgeon: Tia Alert, MD;  [facility] OR;  Service: Neurosurgery;  Laterality: Left;  . SHOULDER SURGERY Left 05/07/2009  . TONSILLECTOMY     Family History  Problem Relation Age of Onset  . Heart disease Mother   . Heart attack Mother   . Stroke Mother   . Heart disease Father   . Heart attack Father   . Stroke Father   . Breast cancer Sister     Twin   . Heart attack Sister   . Dementia Sister   . Heart disease Sister   . Heart attack Sister   . Clotting disorder Sister   . Heart attack Sister   . Colon cancer Neg Hx    Social History  Substance Use Topics  . Smoking status: Never Smoker  . Smokeless tobacco: Never Used  . Alcohol use No   Current Outpatient Prescriptions  Medication Sig Dispense Refill  . Blood Glucose Monitoring Suppl (ACCU-CHEK AVIVA PLUS) w/Device KIT Use to check blood sugar 1 time per day. Dx code E11.9 (Patient taking differently: 1 strip by Other route See admin instructions. Use to check blood sugar once to twice a week. Dx code E11.9) 1 kit 0  . brimonidine-timolol (COMBIGAN) 0.2-0.5 % ophthalmic solution Place 1 drop into both eyes every 12 (twelve) hours.    Marland Kitchen ELIQUIS 5 MG TABS tablet TAKE 1 TABLET (5 MG TOTAL) BY MOUTH 2 (TWO) TIMES  DAILY. 60 tablet 3  . esomeprazole (NEXIUM) 40 MG capsule Take 40 mg by mouth daily.     . furosemide (LASIX) 20 MG tablet TAKE 1 TABLET BY MOUTH DAILY. 90 tablet 2  . imatinib (GLEEVEC) 100 MG tablet Take 2 tablets (200 mg total) by mouth 2 (two) times daily with a meal. Take with meals and large glass of water.Caution:Chemotherapy. (Patient taking differently: Take 200 mg by mouth daily with lunch. Take with meals and large glass of water.Caution:Chemotherapy.)    . isosorbide mononitrate (IMDUR) 30 MG 24 hr tablet Take 1 tablet (30 mg total) by mouth daily. 90  tablet 3  . lidocaine (LIDODERM) 5 % Place 1 patch onto the skin daily. Remove & Discard patch within 12 hours or as directed by MD 30 patch 11  . lisinopril (PRINIVIL,ZESTRIL) 5 MG tablet Take 1 tablet (5 mg total) by mouth daily. 30 tablet 11  . loperamide (IMODIUM) 2 MG capsule Take 2 mg by mouth 3 (three) times daily as needed for diarrhea or loose stools. For diarrhea    . LORazepam (ATIVAN) 0.5 MG tablet Take 1 tablet (0.5 mg total) by mouth at bedtime. As needed for sleep (Patient taking differently: Take 0.5 mg by mouth at bedtime as needed for sleep. ) 30 tablet 2  . Melatonin 3 MG TABS Take 3 mg by mouth at bedtime as needed (for sleep).     . Multiple Vitamin (MULTIVITAMIN) tablet Take 1 tablet by mouth daily.    Marland Kitchen NITROSTAT 0.4 MG SL tablet PLACE 1 TABLET UNDER THE TONGUE EVERY 5 MINUTES X3 DOSES AS NEEDED FOR CHEST PAIN 100 tablet 1  . predniSONE (DELTASONE) 5 MG tablet TAKE 1 TABLET (5 MG TOTAL) BY MOUTH DAILY. 90 tablet 3  . promethazine (PHENERGAN) 12.5 MG tablet Take 1 tablet (12.5 mg total) by mouth every 6 (six) hours as needed for nausea or vomiting. 30 tablet 0  . pyridostigmine (MESTINON) 60 MG tablet Take 1 tablet (60 mg total) by mouth 3 (three) times daily. (Patient taking differently: Take 60 mg by mouth daily. ) 90 tablet 11  . rosuvastatin (CRESTOR) 10 MG tablet TAKE 1/2 TABLETS BY MOUTH DAILY. (Patient taking  differently: Take 5 mg by mouth once a day) 45 tablet 0  . traMADol (ULTRAM) 50 MG tablet Take 1 tablet (50 mg total) by mouth every 6 (six) hours as needed. 30 tablet 0   No current facility-administered medications for this visit.    Allergies  Allergen Reactions  . Phenergan [Promethazine Hcl] Swelling    Cannot be combined with Zofran because swelling of the eyes resulted and patient became very restless-  . Zofran [Ondansetron Hcl] Other (See Comments)    Cannot be combined with Phenergan because swelling of the eyes resulted and patient became very restless-  . Sulfamethoxazole Rash and Itching     Review of Systems: All systems reviewed and negative except where noted in HPI.    US Venous Img Lower Bilateral  Result Date: 04/09/2016 CLINICAL DATA:  Remote history of DVT and pulmonary emboli. Patient is being seen as an outpatient for possible filter retrieval. He is now 2 months status post lumbar microdiskectomy. EXAM: BILATERAL LOWER EXTREMITY VENOUS DOPPLER ULTRASOUND TECHNIQUE: Gray-scale sonography with graded compression, as well as color Doppler and duplex ultrasound were performed to evaluate the lower extremity deep venous systems from the level of the common femoral vein and including the common femoral, femoral, profunda femoral, popliteal and calf veins including the posterior tibial, peroneal and gastrocnemius veins when visible. The superficial great saphenous vein was also interrogated. Spectral Doppler was utilized to evaluate flow at rest and with distal augmentation maneuvers in the common femoral, femoral and popliteal veins. COMPARISON:  None. FINDINGS: RIGHT LOWER EXTREMITY Common Femoral Vein: No evidence of thrombus. Normal compressibility, respiratory phasicity and response to augmentation. Saphenofemoral Junction: No evidence of thrombus. Normal compressibility and flow on color Doppler imaging. Profunda Femoral Vein: No evidence of thrombus. Normal compressibility  and flow on color Doppler imaging. Femoral Vein: No evidence of thrombus. Normal compressibility, respiratory phasicity and response to augmentation. Popliteal Vein: No evidence  of thrombus. Normal compressibility, respiratory phasicity and response to augmentation. Calf Veins: No evidence of thrombus. Normal compressibility and flow on color Doppler imaging. Superficial Great Saphenous Vein: No evidence of thrombus. Normal compressibility and flow on color Doppler imaging. Venous Reflux:  None. Other Findings:  None. LEFT LOWER EXTREMITY Common Femoral Vein: No evidence of thrombus. Normal compressibility, respiratory phasicity and response to augmentation. Saphenofemoral Junction: No evidence of thrombus. Normal compressibility and flow on color Doppler imaging. Profunda Femoral Vein: No evidence of thrombus. Normal compressibility and flow on color Doppler imaging. Femoral Vein: No evidence of thrombus. Normal compressibility, respiratory phasicity and response to augmentation. Popliteal Vein: No evidence of thrombus. Normal compressibility, respiratory phasicity and response to augmentation. Calf Veins: No evidence of thrombus. Normal compressibility and flow on color Doppler imaging. Superficial Great Saphenous Vein: No evidence of thrombus. Normal compressibility and flow on color Doppler imaging. Venous Reflux:  None. Other Findings:  None. IMPRESSION: No evidence of deep venous thrombosis. Electronically Signed   By: Jerilynn Mages.  Shick M.D.   On: 04/09/2016 11:09   Ir Radiologist Eval & Mgmt  Result Date: 04/17/2016 Please refer to notes tab for details about interventional procedure. (Op Note)  Ir Ivc Filter Retrieval / S&i /img Guid/mod Sed  Result Date: 04/30/2016 INDICATION: Recent back surgery and remote history of DVT. Two per IVC filter for the operative procedure. Patient's canal recovered and ambulatory. IVC filter no longer necessary. EXAM: ULTRASOUND GUIDANCE FOR VASCULAR ACCESS IVC CATHETERIZATION  VENOGRAM SUCCESSFUL IVC FILTER RETRIEVAL MEDICATIONS: 1% lidocaine locally ANESTHESIA/SEDATION: 2.0 mg IV Versed; 50 mcg IV Fentanyl Moderate Sedation Time:  12 minutes The patient was continuously monitored during the procedure by the interventional radiology nurse under my direct supervision. FLUOROSCOPY TIME:  Fluoroscopy Time: 2 minutes 24 seconds (149 mGy). COMPLICATIONS: None immediate. PROCEDURE: Informed written consent was obtained from the patient after a thorough discussion of the procedural risks, benefits and alternatives. All questions were addressed. Maximal Sterile Barrier Technique was utilized including caps, mask, sterile gowns, sterile gloves, sterile drape, hand hygiene and skin antiseptic. A timeout was performed prior to the initiation of the procedure. Under sterile conditions and local anesthesia, ultrasound micropuncture access performed of a right internal jugular vein. Over a guidewire, a 5 French pigtail catheter was advanced into the IVC below the filter. IVC venogram performed. IVC venogram: Filter is positioned and well centered in the infrarenal IVC. No interval change in position. IVC remains patent. Negative for thrombus, occlusion or stenosis. IVC filter retrieval: Pigtail catheter removed. Tract dilatation performed to advance the filter retrieval sheath. The filter apex was captured with a snare device. This allowed successful collapse and removal of the filter through the access without complication. Final venogram confirms patency of the IVC without complication. Access removed. Hemostasis obtained with compression. Patient tolerated the procedure well. No immediate complication IMPRESSION: Successful infrarenal IVC filter retrieval PLAN: No further follow-up.  Continue oral anticoagulation Electronically Signed   By: Jerilynn Mages.  Shick M.D.   On: 04/30/2016 13:01   Lab Results  Component Value Date   WBC 7.8 04/30/2016   HGB 11.9 (L) 04/30/2016   HCT 35.6 (L) 04/30/2016   MCV  94.4 04/30/2016   PLT 231 04/30/2016    Lab Results  Component Value Date   CREATININE 1.35 (H) 04/30/2016   BUN 12 04/30/2016   NA 140 04/30/2016   K 3.9 04/30/2016   CL 103 04/30/2016   CO2 29 04/30/2016    Lab Results  Component Value Date  ALT 39 02/22/2016   AST 36 02/22/2016   ALKPHOS 44 02/22/2016   BILITOT 0.9 02/22/2016      Physical Exam: BP 140/64   Pulse 80   Ht 5' 5.75" (1.67 m)   Wt 176 lb (79.8 kg)   BMI 28.62 kg/m  Constitutional: Pleasant,well-developed, male in no acute distress. HEENT: Normocephalic and atraumatic. Conjunctivae are normal. No scleral icterus. Neck supple.  Cardiovascular: Normal rate, regular rhythm.  Pulmonary/chest: Effort normal and breath sounds normal. No wheezing, rales or rhonchi. Abdominal: Soft, nondistended, nontender.  There are no masses palpable.  Extremities: trace LE edema Lymphadenopathy: No cervical adenopathy noted. Neurological: Alert and oriented to person place and time. Skin: Skin is warm and dry. No rashes noted. Psychiatric: Normal mood and affect. Behavior is normal.   ASSESSMENT AND PLAN: 76 y/o male with multiple medical problems here for reassessment of the following issues:  Chronic dyspepsia / nausea / loose stools - I suspect these are all likely related to his Gleevac use. He did have a period of time when he stopped gleevac and symptoms resolved, but told he needs this indefinitely. He otherwise has a remote history of an abnormal gastric emptying study, unclear if related to [place] or not, but does not want to take Reglan or erythromycin given potential risks. He appears to respond better to Zofran and Phenergan, and we'll give him Zofran 8 mg every 8 hours as needed. The high-dose may also help his loose stools. He can otherwise continue Imodium as needed. We will also add a trial of FD [place] to see if this will improve his dyspepsia. He agreed with the plan he can follow-up as needed.  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 Symptoms are mild and stable and he does not want further evaluation at this time. I don't think manometry will likely change management.   Duodenal adenoma - 51m lesion noted incidentally during EGD and removed in entirety. No guidelines currently regarding surveillance interval for sporadic duodenal adenoma however recent literature recommends surveillance within 6 months for sporadic duodenal adenomas (World J Gastroenterol. 2016 Jan). I have discussed this with him. I do think his lesion was low risk, and he has recently had a CVA within the past year and on Eliquis. He wishes to hold off on surveillance at this time which is reasonable.   Patient can follow-up with me in 6 months for reassessment or sooner with any worsening symptoms.  SCarolina Cellar MD LMedical City FriscoGastroenterology Pager [phone]

## 2016-05-12 DIAGNOSIS — L821 Other seborrheic keratosis: Secondary | ICD-10-CM | POA: Diagnosis not present

## 2016-05-12 DIAGNOSIS — L570 Actinic keratosis: Secondary | ICD-10-CM | POA: Diagnosis not present

## 2016-05-12 DIAGNOSIS — C44311 Basal cell carcinoma of skin of nose: Secondary | ICD-10-CM | POA: Diagnosis not present

## 2016-05-28 ENCOUNTER — Ambulatory Visit (INDEPENDENT_AMBULATORY_CARE_PROVIDER_SITE_OTHER): Payer: Medicare Other | Admitting: Neurology

## 2016-05-28 ENCOUNTER — Encounter: Payer: Self-pay | Admitting: Neurology

## 2016-05-28 ENCOUNTER — Telehealth: Payer: Self-pay | Admitting: Endocrinology

## 2016-05-28 VITALS — BP 138/75 | HR 67 | Ht 65.75 in | Wt 179.0 lb

## 2016-05-28 DIAGNOSIS — G7000 Myasthenia gravis without (acute) exacerbation: Secondary | ICD-10-CM | POA: Diagnosis not present

## 2016-05-28 NOTE — Progress Notes (Signed)
  Reason for visit: Myasthenia gravis  [name] is an 76 y.o. male  History of present illness:  [name] is a 76-year-old right-handed white male with a history of myasthenia gravis. 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 In January 2018 he had lumbosacral spine surgery for a large herniated disc at the L2-3 level. This was done by Dr. Jones. The patient has some residual left-sided leg weakness following the surgery. The pain has markedly improved, however. The patient has not had any falls. He denies any problems with his myasthenia, he has not had any ptosis or double vision. He is able to drive a car or read a book without difficulty. He does occasionally have some problems with choking with swallowing, but this is not a big problem for him. He returns to this office for an evaluation.  Past Medical History:  Diagnosis Date  . Bruises easily    d/t being on Eliquis  . Carotid artery disease (HCC)    a. Duplex 05/2014: 40-59% RICA, 1-39% LICA, elevated velocities in right subclavian artery, normal left subclavian artery..  . Chronic back pain    HNP  . Chronic kidney disease (CKD)   . Claustrophobia    takes Ativan as needed  . CML (chronic myeloid leukemia) (HCC)    takes Gleevec daily  . Coronary atherosclerosis of unspecified type of vessel, native or graft    a. cath in 2003 showed 10-20% LM, scattered 20% prox-mid LAD, 30% more distal LAD, 50-60% stenosis of LAD towards apex, 20% Cx, 30% dRCA, focal 95% stenosis in smaller of 2 branches of PDA, EF 60-65%.  . Diabetes mellitus (HCC)   . Diarrhea    takes Imodium daily as needed  . Diverticulosis   . Diverticulosis of colon (without mention of hemorrhage)   . DJD (degenerative joint disease)   . DVT (deep venous thrombosis) (HCC) 07/2011  . Esophageal stricture   . Essential hypertension    takes Imdur and Lisinopril daily  . Gastric polyps    benign  . GERD  (gastroesophageal reflux disease)    takes Nexium daily  . Glaucoma    uses eye drops  . History of bronchitis    not sure when the last time  . History of colon polyps    benign  . History of kidney stones   . History of kidney stones   . Insomnia    takes Melatonin nightly as needed  . Mixed hyperlipidemia    takes Crestor daily  . Myasthenia gravis (HCC)    uses prednisone  . Osteoporosis 2016  . Peripheral edema    takes Lasix daily  . Pituitary tumor    checks Dr.Walden at [facility] checks it every Jan   . Pneumonia 01/2016  . PONV (postoperative nausea and vomiting)   . Ptosis   . Pulmonary embolus (HCC) 2012  . Stroke (HCC) 06/2015    Past Surgical History:  Procedure Laterality Date  . BACK SURGERY     x2, lumbar and cervical  . CARDIAC CATHETERIZATION    . CATARACT EXTRACTION Bilateral 12/12  . COLONOSCOPY    . ESOPHAGOGASTRODUODENOSCOPY ENDOSCOPY  04/2015  . IR GENERIC HISTORICAL  03/03/2016   IR IVC FILTER PLMT / S&I /IMG GUID/MOD SED 03/03/2016 Michael Shick, MD [facility]  . IR GENERIC HISTORICAL  04/09/2016   IR RADIOLOGIST EVAL & MGMT 04/09/2016 Michael Shick, MD [facility]  .   IR GENERIC HISTORICAL  04/30/2016   IR IVC FILTER RETRIEVAL / S&I [name] GUID/MOD SED 04/30/2016 [name], MD [facility]  . LITHOTRIPSY    . LUMBAR LAMINECTOMY/DECOMPRESSION MICRODISCECTOMY Right 09/14/2012   Procedure: Right Lumbar three-four, four-five, Lumbar five-Sacral one decompressive laminectomy;  Surgeon: [name], MD;  Location: [facility];  Service: Neurosurgery;  Laterality: Right;  . LUMBAR LAMINECTOMY/DECOMPRESSION MICRODISCECTOMY Left 03/11/2016   Procedure: Left Lumbar Two-ThreeMicrodiscectomy;  Surgeon: [name], MD;  Location: [facility];  Service: Neurosurgery;  Laterality: Left;  . SHOULDER SURGERY Left 05/07/2009  . TONSILLECTOMY      Family History  Problem Relation Age of Onset  . Heart disease Mother   . Heart attack Mother   . Stroke Mother    . Heart disease Father   . Heart attack Father   . Stroke Father   . Breast cancer Sister     Twin   . Heart attack Sister   . Dementia Sister   . Heart disease Sister   . Heart attack Sister   . Clotting disorder Sister   . Heart attack Sister   . Colon cancer Neg Hx     Social history:  reports that he has never smoked. He has never used smokeless tobacco. He reports that he does not drink alcohol or use drugs.    Allergies  Allergen Reactions  . Phenergan [Promethazine Hcl] Swelling    Cannot be combined with Zofran because swelling of the eyes resulted and patient became very restless-  . Zofran [Ondansetron Hcl] Other (See Comments)    Cannot be combined with Phenergan because swelling of the eyes resulted and patient became very restless-  . Sulfamethoxazole Rash and Itching    Medications:  Prior to Admission medications   Medication Sig Start Date End Date Taking? Authorizing Provider  Blood Glucose Monitoring Suppl (ACCU-CHEK AVIVA PLUS) w/Device KIT Use to check blood sugar 1 time per day. Dx code E11.9 Patient taking differently: 1 strip by Other route See admin instructions. Use to check blood sugar once to twice a week. Dx code E11.9 01/08/16  Yes Renato Shin, MD  brimonidine-timolol (COMBIGAN) 0.2-0.5 % ophthalmic solution Place 1 drop into both eyes every 12 (twelve) hours.   Yes Historical Provider, MD  ELIQUIS 5 MG TABS tablet TAKE 1 TABLET (5 MG TOTAL) BY MOUTH 2 (TWO) TIMES DAILY. 12/09/15  Yes Kathrynn Ducking, MD  esomeprazole (NEXIUM) 40 MG capsule Take 40 mg by mouth daily.    Yes Historical Provider, MD  furosemide (LASIX) 20 MG tablet TAKE 1 TABLET BY MOUTH DAILY. 12/23/15  Yes Sherren Mocha, MD  imatinib (GLEEVEC) 100 MG tablet Take 2 tablets (200 mg total) by mouth 2 (two) times daily with a meal. Take with meals and large glass of water.Caution:Chemotherapy. Patient taking differently: Take 300 mg by mouth daily. Take with meals and large glass of  water.Caution:Chemotherapy. 06/27/15  Yes Ivan Anchors Love, PA-C  isosorbide mononitrate (IMDUR) 30 MG 24 hr tablet Take 1 tablet (30 mg total) by mouth daily. 09/17/15  Yes Sherren Mocha, MD  lidocaine (LIDODERM) 5 % Place 1 patch onto the skin daily. Remove & Discard patch within 12 hours or as directed by MD 10/11/15  Yes Renato Shin, MD  lisinopril (PRINIVIL,ZESTRIL) 5 MG tablet Take 1 tablet (5 mg total) by mouth daily. 11/05/15  Yes Sherren Mocha, MD  loperamide (IMODIUM) 2 MG capsule Take 2 mg by mouth 3 (three) times daily as needed for  diarrhea or loose stools. For diarrhea   Yes Historical Provider, MD  LORazepam (ATIVAN) 0.5 MG tablet Take 1 tablet (0.5 mg total) by mouth at bedtime. As needed for sleep Patient taking differently: Take 0.5 mg by mouth at bedtime as needed for sleep.  07/11/15  Yes Renato Shin, MD  Melatonin 3 MG TABS Take 3 mg by mouth at bedtime as needed (for sleep).    Yes Historical Provider, MD  Multiple Vitamin (MULTIVITAMIN) tablet Take 1 tablet by mouth daily.   Yes Historical Provider, MD  NITROSTAT 0.4 MG SL tablet PLACE 1 TABLET UNDER THE TONGUE EVERY 5 MINUTES X3 DOSES AS NEEDED FOR CHEST PAIN 11/19/15  Yes Sherren Mocha, MD  ondansetron (ZOFRAN ODT) 8 MG disintegrating tablet Take 1 tablet (8 mg total) by mouth every 8 (eight) hours as needed for nausea or vomiting. 05/07/16  Yes Manus Gunning, MD  predniSONE (DELTASONE) 5 MG tablet TAKE 1 TABLET (5 MG TOTAL) BY MOUTH DAILY. 10/25/15  Yes Kathrynn Ducking, MD  promethazine (PHENERGAN) 12.5 MG tablet Take 1 tablet (12.5 mg total) by mouth every 6 (six) hours as needed for nausea or vomiting. 06/28/15  Yes Ivan Anchors Love, PA-C  pyridostigmine (MESTINON) 60 MG tablet Take 1 tablet (60 mg total) by mouth 3 (three) times daily. Patient taking differently: Take 60 mg by mouth daily.  09/03/15  Yes Kathrynn Ducking, MD  rosuvastatin (CRESTOR) 10 MG tablet TAKE 1/2 TABLETS BY MOUTH DAILY. Patient taking differently:  Take 5 mg by mouth once a day 09/09/15  Yes Sherren Mocha, MD  traMADol (ULTRAM) 50 MG tablet Take 1 tablet (50 mg total) by mouth every 6 (six) hours as needed. 02/21/16  Yes Renato Shin, MD    ROS:  Out of a complete 14 system review of symptoms, the patient complains only of the following symptoms, and all other reviewed systems are negative.  Hearing loss, ringing in the ears Back pain, walking difficulty  Blood pressure 138/75, pulse 67, height 5' 5.75" (1.67 m), weight 179 lb (81.2 kg), SpO2 97 %.  Physical Exam  General: The patient is alert and cooperative at the time of the examination.  Skin: No significant peripheral edema is noted.   Neurologic Exam  Mental status: The patient is alert and oriented x 3 at the time of the examination. The patient has apparent normal recent and remote memory, with an apparently normal attention span and concentration ability.   Cranial nerves: Facial symmetry is present. Speech is normal, no aphasia or dysarthria is noted. Extraocular movements are full. Visual fields are full.  Motor: The patient has good strength in all 4 extremities, with exception of some slight weakness with the left leg with hip flexion.  Sensory examination: Soft touch sensation is symmetric on the face, arms, and legs.  Coordination: The patient has good finger-nose-finger and heel-to-shin bilaterally.  Gait and station: The patient has a limping type gait on the left leg. Tandem gait was not attempted. Romberg is negative. No drift is seen.  Reflexes: Deep tendon reflexes are symmetric.   Assessment/Plan:  1. Myasthenia gravis with ocular features  2. Low back pain, left leg weakness  The patient is recovering from his lumbosacral spine surgery. He is quite stable with his myasthenia gravis, he will continue on prednisone 5 mg daily, and continue on his Mestinon. He will follow-up in about 6 months, we may try to taper down some on the prednisone at that  time.  Jill Alexanders MD 05/28/2016 9:36 AM  [facility] [address] [place], [place] [address]  Phone [phone] Fax [phone]

## 2016-06-11 DIAGNOSIS — Z9889 Other specified postprocedural states: Secondary | ICD-10-CM | POA: Diagnosis not present

## 2016-06-11 DIAGNOSIS — M2560 Stiffness of unspecified joint, not elsewhere classified: Secondary | ICD-10-CM | POA: Diagnosis not present

## 2016-06-11 DIAGNOSIS — M25612 Stiffness of left shoulder, not elsewhere classified: Secondary | ICD-10-CM | POA: Diagnosis not present

## 2016-06-11 DIAGNOSIS — R5383 Other fatigue: Secondary | ICD-10-CM | POA: Diagnosis not present

## 2016-06-11 DIAGNOSIS — M6281 Muscle weakness (generalized): Secondary | ICD-10-CM | POA: Diagnosis not present

## 2016-06-11 DIAGNOSIS — M545 Low back pain: Secondary | ICD-10-CM | POA: Diagnosis not present

## 2016-06-11 DIAGNOSIS — R2689 Other abnormalities of gait and mobility: Secondary | ICD-10-CM | POA: Diagnosis not present

## 2016-06-11 DIAGNOSIS — R293 Abnormal posture: Secondary | ICD-10-CM | POA: Diagnosis not present

## 2016-06-11 DIAGNOSIS — M25611 Stiffness of right shoulder, not elsewhere classified: Secondary | ICD-10-CM | POA: Diagnosis not present

## 2016-06-11 DIAGNOSIS — M5416 Radiculopathy, lumbar region: Secondary | ICD-10-CM | POA: Diagnosis not present

## 2016-06-11 DIAGNOSIS — R2681 Unsteadiness on feet: Secondary | ICD-10-CM | POA: Diagnosis not present

## 2016-06-12 ENCOUNTER — Other Ambulatory Visit: Payer: Self-pay | Admitting: Endocrinology

## 2016-06-12 DIAGNOSIS — I6523 Occlusion and stenosis of bilateral carotid arteries: Secondary | ICD-10-CM

## 2016-06-16 ENCOUNTER — Ambulatory Visit (HOSPITAL_COMMUNITY)
Admission: RE | Admit: 2016-06-16 | Discharge: 2016-06-16 | Disposition: A | Discharge status: Home / Self Care | Payer: Medicare Other | Source: Ambulatory Visit | Attending: Cardiology | Admitting: Cardiology

## 2016-06-16 DIAGNOSIS — Z8673 Personal history of transient ischemic attack (TIA), and cerebral infarction without residual deficits: Secondary | ICD-10-CM | POA: Diagnosis not present

## 2016-06-16 DIAGNOSIS — I10 Essential (primary) hypertension: Secondary | ICD-10-CM | POA: Insufficient documentation

## 2016-06-16 DIAGNOSIS — I2510 Atherosclerotic heart disease of native coronary artery without angina pectoris: Secondary | ICD-10-CM | POA: Diagnosis not present

## 2016-06-16 DIAGNOSIS — M545 Low back pain: Secondary | ICD-10-CM | POA: Diagnosis not present

## 2016-06-16 DIAGNOSIS — M5416 Radiculopathy, lumbar region: Secondary | ICD-10-CM | POA: Diagnosis not present

## 2016-06-16 DIAGNOSIS — Z9889 Other specified postprocedural states: Secondary | ICD-10-CM | POA: Diagnosis not present

## 2016-06-16 DIAGNOSIS — R293 Abnormal posture: Secondary | ICD-10-CM | POA: Diagnosis not present

## 2016-06-16 DIAGNOSIS — M6281 Muscle weakness (generalized): Secondary | ICD-10-CM | POA: Diagnosis not present

## 2016-06-16 DIAGNOSIS — R5383 Other fatigue: Secondary | ICD-10-CM | POA: Diagnosis not present

## 2016-06-16 DIAGNOSIS — I6523 Occlusion and stenosis of bilateral carotid arteries: Secondary | ICD-10-CM | POA: Insufficient documentation

## 2016-06-16 DIAGNOSIS — E119 Type 2 diabetes mellitus without complications: Secondary | ICD-10-CM | POA: Diagnosis present

## 2016-06-18 DIAGNOSIS — M545 Low back pain: Secondary | ICD-10-CM | POA: Diagnosis not present

## 2016-06-18 DIAGNOSIS — R293 Abnormal posture: Secondary | ICD-10-CM | POA: Diagnosis not present

## 2016-06-18 DIAGNOSIS — Z9889 Other specified postprocedural states: Secondary | ICD-10-CM | POA: Diagnosis not present

## 2016-06-18 DIAGNOSIS — R5383 Other fatigue: Secondary | ICD-10-CM | POA: Diagnosis not present

## 2016-06-18 DIAGNOSIS — M6281 Muscle weakness (generalized): Secondary | ICD-10-CM | POA: Diagnosis not present

## 2016-06-18 DIAGNOSIS — M5416 Radiculopathy, lumbar region: Secondary | ICD-10-CM | POA: Diagnosis not present

## 2016-06-23 DIAGNOSIS — M5416 Radiculopathy, lumbar region: Secondary | ICD-10-CM | POA: Diagnosis not present

## 2016-06-23 DIAGNOSIS — Z9889 Other specified postprocedural states: Secondary | ICD-10-CM | POA: Diagnosis not present

## 2016-06-23 DIAGNOSIS — R5383 Other fatigue: Secondary | ICD-10-CM | POA: Diagnosis not present

## 2016-06-23 DIAGNOSIS — M6281 Muscle weakness (generalized): Secondary | ICD-10-CM | POA: Diagnosis not present

## 2016-06-23 DIAGNOSIS — R293 Abnormal posture: Secondary | ICD-10-CM | POA: Diagnosis not present

## 2016-06-23 DIAGNOSIS — M545 Low back pain: Secondary | ICD-10-CM | POA: Diagnosis not present

## 2016-06-25 DIAGNOSIS — M545 Low back pain: Secondary | ICD-10-CM | POA: Diagnosis not present

## 2016-06-25 DIAGNOSIS — R293 Abnormal posture: Secondary | ICD-10-CM | POA: Diagnosis not present

## 2016-06-25 DIAGNOSIS — Z9889 Other specified postprocedural states: Secondary | ICD-10-CM | POA: Diagnosis not present

## 2016-06-25 DIAGNOSIS — M5416 Radiculopathy, lumbar region: Secondary | ICD-10-CM | POA: Diagnosis not present

## 2016-06-25 DIAGNOSIS — M6281 Muscle weakness (generalized): Secondary | ICD-10-CM | POA: Diagnosis not present

## 2016-06-25 DIAGNOSIS — R5383 Other fatigue: Secondary | ICD-10-CM | POA: Diagnosis not present

## 2016-06-30 DIAGNOSIS — Z9889 Other specified postprocedural states: Secondary | ICD-10-CM | POA: Diagnosis not present

## 2016-06-30 DIAGNOSIS — R5383 Other fatigue: Secondary | ICD-10-CM | POA: Diagnosis not present

## 2016-06-30 DIAGNOSIS — M545 Low back pain: Secondary | ICD-10-CM | POA: Diagnosis not present

## 2016-06-30 DIAGNOSIS — R293 Abnormal posture: Secondary | ICD-10-CM | POA: Diagnosis not present

## 2016-06-30 DIAGNOSIS — M6281 Muscle weakness (generalized): Secondary | ICD-10-CM | POA: Diagnosis not present

## 2016-06-30 DIAGNOSIS — M5416 Radiculopathy, lumbar region: Secondary | ICD-10-CM | POA: Diagnosis not present

## 2016-07-01 ENCOUNTER — Telehealth: Payer: Self-pay | Admitting: Internal Medicine

## 2016-07-01 ENCOUNTER — Other Ambulatory Visit: Payer: Self-pay

## 2016-07-01 ENCOUNTER — Observation Stay (HOSPITAL_COMMUNITY)
Admission: EM | Admit: 2016-07-01 | Discharge: 2016-07-03 | Disposition: A | Discharge status: Home / Self Care | Payer: Medicare Other | Attending: Internal Medicine | Admitting: Internal Medicine

## 2016-07-01 ENCOUNTER — Emergency Department (HOSPITAL_COMMUNITY): Payer: Medicare Other

## 2016-07-01 ENCOUNTER — Encounter (HOSPITAL_COMMUNITY): Payer: Self-pay

## 2016-07-01 DIAGNOSIS — G4700 Insomnia, unspecified: Secondary | ICD-10-CM | POA: Diagnosis not present

## 2016-07-01 DIAGNOSIS — Z79899 Other long term (current) drug therapy: Secondary | ICD-10-CM | POA: Diagnosis not present

## 2016-07-01 DIAGNOSIS — Z7901 Long term (current) use of anticoagulants: Secondary | ICD-10-CM | POA: Diagnosis not present

## 2016-07-01 DIAGNOSIS — D630 Anemia in neoplastic disease: Secondary | ICD-10-CM | POA: Insufficient documentation

## 2016-07-01 DIAGNOSIS — C9210 Chronic myeloid leukemia, BCR/ABL-positive, not having achieved remission: Secondary | ICD-10-CM | POA: Insufficient documentation

## 2016-07-01 DIAGNOSIS — E782 Mixed hyperlipidemia: Secondary | ICD-10-CM | POA: Diagnosis not present

## 2016-07-01 DIAGNOSIS — H532 Diplopia: Secondary | ICD-10-CM | POA: Diagnosis not present

## 2016-07-01 DIAGNOSIS — I2510 Atherosclerotic heart disease of native coronary artery without angina pectoris: Secondary | ICD-10-CM | POA: Diagnosis not present

## 2016-07-01 DIAGNOSIS — E119 Type 2 diabetes mellitus without complications: Secondary | ICD-10-CM

## 2016-07-01 DIAGNOSIS — Z882 Allergy status to sulfonamides status: Secondary | ICD-10-CM | POA: Insufficient documentation

## 2016-07-01 DIAGNOSIS — I5032 Chronic diastolic (congestive) heart failure: Secondary | ICD-10-CM | POA: Insufficient documentation

## 2016-07-01 DIAGNOSIS — G7000 Myasthenia gravis without (acute) exacerbation: Secondary | ICD-10-CM | POA: Diagnosis present

## 2016-07-01 DIAGNOSIS — Z7952 Long term (current) use of systemic steroids: Secondary | ICD-10-CM | POA: Diagnosis not present

## 2016-07-01 DIAGNOSIS — Z86711 Personal history of pulmonary embolism: Secondary | ICD-10-CM | POA: Diagnosis not present

## 2016-07-01 DIAGNOSIS — R197 Diarrhea, unspecified: Secondary | ICD-10-CM | POA: Diagnosis not present

## 2016-07-01 DIAGNOSIS — I69354 Hemiplegia and hemiparesis following cerebral infarction affecting left non-dominant side: Secondary | ICD-10-CM | POA: Diagnosis not present

## 2016-07-01 DIAGNOSIS — N183 Chronic kidney disease, stage 3 (moderate): Secondary | ICD-10-CM | POA: Insufficient documentation

## 2016-07-01 DIAGNOSIS — F40240 Claustrophobia: Secondary | ICD-10-CM | POA: Insufficient documentation

## 2016-07-01 DIAGNOSIS — Z888 Allergy status to other drugs, medicaments and biological substances status: Secondary | ICD-10-CM | POA: Insufficient documentation

## 2016-07-01 DIAGNOSIS — R42 Dizziness and giddiness: Secondary | ICD-10-CM

## 2016-07-01 DIAGNOSIS — H409 Unspecified glaucoma: Secondary | ICD-10-CM | POA: Insufficient documentation

## 2016-07-01 DIAGNOSIS — I130 Hypertensive heart and chronic kidney disease with heart failure and stage 1 through stage 4 chronic kidney disease, or unspecified chronic kidney disease: Secondary | ICD-10-CM | POA: Insufficient documentation

## 2016-07-01 DIAGNOSIS — R29818 Other symptoms and signs involving the nervous system: Secondary | ICD-10-CM | POA: Diagnosis not present

## 2016-07-01 DIAGNOSIS — I10 Essential (primary) hypertension: Secondary | ICD-10-CM | POA: Diagnosis present

## 2016-07-01 DIAGNOSIS — K219 Gastro-esophageal reflux disease without esophagitis: Secondary | ICD-10-CM | POA: Insufficient documentation

## 2016-07-01 DIAGNOSIS — G459 Transient cerebral ischemic attack, unspecified: Secondary | ICD-10-CM

## 2016-07-01 DIAGNOSIS — E1122 Type 2 diabetes mellitus with diabetic chronic kidney disease: Secondary | ICD-10-CM | POA: Insufficient documentation

## 2016-07-01 DIAGNOSIS — Z86718 Personal history of other venous thrombosis and embolism: Secondary | ICD-10-CM | POA: Insufficient documentation

## 2016-07-01 LAB — PROTIME-INR
INR: 1.09
Prothrombin Time: 14.1 seconds (ref 11.4–15.2)

## 2016-07-01 LAB — CBG MONITORING, ED: Glucose-Capillary: 134 mg/dL — ABNORMAL HIGH (ref 65–99)

## 2016-07-01 LAB — I-STAT CHEM 8, ED
BUN: 12 mg/dL (ref 6–20)
CALCIUM ION: 1.14 mmol/L — AB (ref 1.15–1.40)
CREATININE: 1.6 mg/dL — AB (ref 0.61–1.24)
Chloride: 105 mmol/L (ref 101–111)
GLUCOSE: 128 mg/dL — AB (ref 65–99)
HCT: 34 % — ABNORMAL LOW (ref 39.0–52.0)
Hemoglobin: 11.6 g/dL — ABNORMAL LOW (ref 13.0–17.0)
Potassium: 4.2 mmol/L (ref 3.5–5.1)
SODIUM: 142 mmol/L (ref 135–145)
TCO2: 27 mmol/L (ref 0–100)

## 2016-07-01 LAB — COMPREHENSIVE METABOLIC PANEL
ALK PHOS: 51 U/L (ref 38–126)
ALT: 28 U/L (ref 17–63)
AST: 23 U/L (ref 15–41)
Albumin: 3.8 g/dL (ref 3.5–5.0)
Anion gap: 8 (ref 5–15)
BILIRUBIN TOTAL: 0.7 mg/dL (ref 0.3–1.2)
BUN: 10 mg/dL (ref 6–20)
CALCIUM: 8.9 mg/dL (ref 8.9–10.3)
CO2: 25 mmol/L (ref 22–32)
Chloride: 107 mmol/L (ref 101–111)
Creatinine, Ser: 1.51 mg/dL — ABNORMAL HIGH (ref 0.61–1.24)
GFR calc Af Amer: 49 mL/min — ABNORMAL LOW (ref >60)
GFR, EST NON AFRICAN AMERICAN: 42 mL/min — AB (ref >60)
Glucose, Bld: 134 mg/dL — ABNORMAL HIGH (ref 65–99)
POTASSIUM: 4.1 mmol/L (ref 3.5–5.1)
Sodium: 140 mmol/L (ref 135–145)
TOTAL PROTEIN: 5.5 g/dL — AB (ref 6.5–8.1)

## 2016-07-01 LAB — DIFFERENTIAL
BASOS ABS: 0 10*3/uL (ref 0.0–0.1)
Basophils Relative: 0 %
EOS ABS: 0.1 10*3/uL (ref 0.0–0.7)
EOS PCT: 2 %
LYMPHS ABS: 1.7 10*3/uL (ref 0.7–4.0)
Lymphocytes Relative: 25 %
MONOS PCT: 10 %
Monocytes Absolute: 0.7 10*3/uL (ref 0.1–1.0)
Neutro Abs: 4.3 10*3/uL (ref 1.7–7.7)
Neutrophils Relative %: 63 %

## 2016-07-01 LAB — APTT: aPTT: 29 seconds (ref 24–36)

## 2016-07-01 LAB — I-STAT TROPONIN, ED: TROPONIN I, POC: 0.02 ng/mL (ref 0.00–0.08)

## 2016-07-01 LAB — CBC
HEMATOCRIT: 35.7 % — AB (ref 39.0–52.0)
HEMOGLOBIN: 11.7 g/dL — AB (ref 13.0–17.0)
MCH: 32.1 pg (ref 26.0–34.0)
MCHC: 32.8 g/dL (ref 30.0–36.0)
MCV: 97.8 fL (ref 78.0–100.0)
Platelets: 174 10*3/uL (ref 150–400)
RBC: 3.65 MIL/uL — ABNORMAL LOW (ref 4.22–5.81)
RDW: 13 % (ref 11.5–15.5)
WBC: 6.8 10*3/uL (ref 4.0–10.5)

## 2016-07-01 MED ORDER — SODIUM CHLORIDE 0.9 % IV BOLUS (SEPSIS)
1000.0000 mL | Freq: Once | INTRAVENOUS | Status: AC
Start: 2016-07-01 — End: 2016-07-01
  Administered 2016-07-01: 1000 mL via INTRAVENOUS

## 2016-07-01 MED ORDER — LORAZEPAM 2 MG/ML IJ SOLN
1.0000 mg | Freq: Once | INTRAMUSCULAR | Status: AC
  Administered 2016-07-02: 1 mg via INTRAVENOUS
  Filled 2016-07-01: qty 1

## 2016-07-01 MED ORDER — LORAZEPAM 1 MG PO TABS: 1.0000 mg | ORAL_TABLET | Freq: Once | ORAL | Status: DC

## 2016-07-01 MED ORDER — APIXABAN 5 MG PO TABS
5.0000 mg | ORAL_TABLET | Freq: Once | ORAL | Status: AC
  Administered 2016-07-02: 5 mg via ORAL
  Filled 2016-07-01 (×2): qty 1

## 2016-07-01 NOTE — ED Notes (Signed)
Code stroke activated @ 2144

## 2016-07-01 NOTE — ED Notes (Signed)
Code stroke cancelled 

## 2016-07-01 NOTE — Telephone Encounter (Signed)
Mr. Sansoucie is a 76 year old male with a history of ocular myasthenia gravis which typically manifests as eyelid drooping and periorbital swelling, left cerebellar stroke approximately one year ago, carotid artery disease, and diffuse coronary artery disease.  I received a call from Mr. Tatham and his Wife as he has had double vision and fatigue for the past day and they have had difficulty reaching their Neurologist.  He and his wife are most concerned about whether he is having another stroke or seizure. He is alert and oriented 3. He has residual left-sided weakness that has not worsened, denies any focal weakness besides this, dysesthesias, dysarthria, gait disturbances, urinary incontinence, tongue biting.  He denies any trouble breathing or swallowing.  I discussed the possibilities with Mr. Rego and his wife. While limited by the fact I can examine them, this sounds much more like worsening myasthenia gravis rather than a stroke.   While we discussed that the next best step may be calling Dr. Jannifer Franklin in the morning, given how frankly disturbed they are by his symptoms, evaluation (phyical exam) in the ER, which they wish to pursue, is reasonable.   They will proceed here. Please check a TSH and involve Neurology as appropriate.  Allyson Sabal, MD Cardiology

## 2016-07-01 NOTE — ED Provider Notes (Signed)
[name]   CSN: [id number] Arrival date & time: 07/01/16  2133     History   Chief Complaint Chief Complaint  Patient presents with  . Diplopia  . Dizziness    HPI [name] is a 76 y.o. male.  Patient has a complicated past medical history. 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 He reports at 7:30 PM, he had onset of lightheadedness as well as double vision. Elevation lasted for approximately 5 minutes, then self resolved. Persistence of lightheadedness. Denies any focal weakness, although he felt globally fatigued. Denies any fevers or chills. No infectious symptoms. No vomiting or diarrhea. Tolerating by mouth without issue.   The history is provided by the patient.  Illness  This is a new problem. Episode onset: 2 hours. The problem occurs constantly. Progression since onset: resolution of double vision. Associated symptoms include headaches. Pertinent negatives include no chest pain, no abdominal pain and no shortness of breath. He has tried nothing for the symptoms.    Past Medical History:  Diagnosis Date  . Bruises easily    d/t being on Eliquis  . Carotid artery disease ([place])    a. Duplex 05/2014: 96-22% RICA, 2-97% LICA, elevated velocities in right subclavian artery, normal left subclavian artery..  . Chronic back pain    HNP  . Chronic kidney disease (CKD)   . Claustrophobia    takes Ativan as needed  . 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 Coronary atherosclerosis of unspecified type of vessel, native or graft    a. cath in 2003 showed 10-20% LM, scattered 20% prox-mid LAD, 30% more distal LAD, 50-60% stenosis of LAD towards apex, 20% Cx, 30% dRCA, focal 95% stenosis in smaller of 2 branches of PDA, EF 60-65%.  . Diabetes mellitus ([place])   . Diarrhea    takes Imodium daily as needed  . Diverticulosis   . Diverticulosis of  colon (without mention of hemorrhage)   . DJD (degenerative joint disease)   . DVT (deep venous thrombosis) ([place]) 07/2011  . Esophageal stricture   . Essential hypertension    takes Imdur and Lisinopril daily  . Gastric polyps    benign  . GERD (gastroesophageal reflux disease)    takes Nexium daily  . Glaucoma    uses eye drops  . History of bronchitis    not sure when the last time  . History of colon polyps    benign  . History of kidney stones   . History of kidney stones   . Insomnia    takes Melatonin nightly as needed  . Mixed hyperlipidemia    takes Crestor daily  . Myasthenia gravis ([place])    uses prednisone  . Osteoporosis 2016  . Peripheral edema    takes Lasix daily  . Pituitary tumor    checks Dr[facility]Walden at [facility] checks it every Jan   . Pneumonia 01/2016  . PONV (postoperative nausea and vomiting)   . Ptosis   . Pulmonary embolus ([place]) 2012  . Stroke [facility]) 06/2015    Patient Active Problem List   Diagnosis Date Noted  . TIA (transient ischemic attack) 07/01/2016  . S/P lumbar laminectomy 03/11/2016  . Radicular pain of left lower extremity 02/21/2016  . Cough 01/10/2016  . 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 Myasthenia gravis ([place])   . Diabetes mellitus type 2 in  nonobese ([place])   . Coronary artery disease involving native coronary artery of native heart without angina pectoris   . Acute blood loss anemia   . Ataxia, post-stroke   . Gait disturbance, post-stroke   . Proximal leg weakness   . Cervical spondylosis without myelopathy   . Achilles tendinitis of right lower extremity   . Chronic diastolic CHF (congestive heart failure) ([place])   . Gastroesophageal reflux disease without esophagitis   . Chronic kidney disease   . Cerebral infarction involving left cerebellar artery ([place]) 06/20/2015  . Cerebellar stroke ([place])   . Ischemic stroke ([place])   . Cerebrovascular accident (CVA) due to thrombosis of left vertebral artery  ([place])   . History of pulmonary embolus (PE)   . Chronic anticoagulation   . HLD (hyperlipidemia)   . Neck pain 03/19/2015  . Renal insufficiency 03/13/2015  . Pain in the chest   . 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 Chest pain syndrome 03/07/2015  . Chronic diastolic heart failure, NYHA class 1 ([place]) 03/07/2015  . Diabetic gastroparesis ([place]) 03/07/2015  . Screening for osteoporosis 07/19/2014  . Routine general medical examination at a health care facility 10/23/2013  . Bilateral carotid bruits 05/24/2013  . Abnormal CT scan, lung 05/24/2013  . Encounter for therapeutic drug monitoring 03/14/2013  . Lymphadenopathy, inguinal 01/11/2013  . 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 Myasthenia gravis with exacerbation ([place]) 03/31/2012  . 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 GERD with stricture 09/23/2011  . Long term (current) use of anticoagulants 08/03/2011  . History of pulmonary embolism 07/30/2011  . History of DVT (deep vein thrombosis) 07/30/2011  . Subconjunctival hemorrhage 06/22/2011  . Bilateral conjunctivitis 04/09/2011  . Diabetes mellitus type 2, controlled ([place]) 12/18/2010  . Hearing loss 08/04/2010  . Ptosis of eyelid 12/30/2009  . Essential hypertension 04/24/2009  . HYPERLIPIDEMIA, MIXED 10/05/2008  . HEMORRHOIDS-INTERNAL 07/02/2008  . HEMORRHOIDS-EXTERNAL 07/02/2008  . DIVERTICULOSIS-COLON 07/02/2008  . PERSONAL HX COLONIC POLYPS 07/02/2008  . RASH-NONVESICULAR 02/07/2008  . PNEUMONIA 12/08/2007  . CML (chronic myelocytic leukemia) ([place]) 07/07/2007  . CAD (coronary artery disease) 07/07/2007  . NEPHROLITHIASIS, HX OF 07/07/2007    Past Surgical History:  Procedure Laterality Date  . BACK SURGERY     x2, lumbar and cervical  . CARDIAC CATHETERIZATION    . CATARACT EXTRACTION Bilateral 12/12  . COLONOSCOPY    . ESOPHAGOGASTRODUODENOSCOPY ENDOSCOPY  04/2015  . IR GENERIC HISTORICAL  03/03/2016   IR IVC FILTER PLMT  / S&I [name] GUID/MOD SED 03/03/2016 [name], MD [facility]INTERV RAD  . IR GENERIC HISTORICAL  04/09/2016   IR RADIOLOGIST EVAL & MGMT 04/09/2016 [name], MD [facility] INTER[facility] RAD  . IR GENERIC HISTORICAL  04/30/2016   IR IVC FILTER RETRIEVAL / S&I [name] GUID/MOD SED 04/30/2016 [name], MD [facility]-INTERV RAD  . LITHOTRIPSY    . LUMBAR LAMINECTOMY/DECOMPRESSION MICRODISCECTOMY Right 09/14/2012   Procedure: Right Lumbar three-four, four-five, Lumbar five-Sacral one decompressive laminectomy;  Surgeon: [name], MD;  Location: [facility];  Service: Neurosurgery;  Laterality: Right;  . LUMBAR LAMINECTOMY/DECOMPRESSION MICRODISCECTOMY Left 03/11/2016   Procedure: Left Lumbar Two-ThreeMicrodiscectomy;  Surgeon: [name], MD;  Location: [facility];  Service: Neurosurgery;  Laterality: Left;  . SHOULDER SURGERY Left 05/07/2009  . TONSILLECTOMY         Home Medications    Prior to Admission medications   Medication Sig Start Date End Date Taking? Authorizing Provider  ELIQUIS 5  MG TABS tablet TAKE 1 TABLET (5 MG TOTAL) BY MOUTH 2 (TWO) TIMES DAILY. 12/09/15  Yes Kathrynn Ducking, MD  Blood Glucose Monitoring Suppl (ACCU-CHEK AVIVA PLUS) w/Device KIT Use to check blood sugar 1 time per day. Dx code E11.9 Patient taking differently: 1 strip by Other route See admin instructions. Use to check blood sugar once to twice a week. Dx code E11.9 01/08/16   Renato Shin, MD  brimonidine-timolol (COMBIGAN) 0.2-0.5 % ophthalmic solution Place 1 drop into both eyes every 12 (twelve) hours.    [provider]  esomeprazole (NEXIUM) 40 MG capsule Take 40 mg by mouth daily.     [provider]  furosemide (LASIX) 20 MG tablet TAKE 1 TABLET BY MOUTH DAILY. 12/23/15   Sherren Mocha, MD  imatinib (GLEEVEC) 100 MG tablet Take 2 tablets (200 mg total) by mouth 2 (two) times daily with a meal. Take with meals and large glass of water.Caution:Chemotherapy. Patient taking differently: Take 300 mg by  mouth daily. Take with meals and large glass of water.Caution:Chemotherapy. 06/27/15   Love, Ivan Anchors, PA-C  isosorbide mononitrate (IMDUR) 30 MG 24 hr tablet Take 1 tablet (30 mg total) by mouth daily. 09/17/15   Sherren Mocha, MD  lidocaine (LIDODERM) 5 % Place 1 patch onto the skin daily. Remove & Discard patch within 12 hours or as directed by MD 10/11/15   Renato Shin, MD  lisinopril (PRINIVIL,ZESTRIL) 5 MG tablet Take 1 tablet (5 mg total) by mouth daily. 11/05/15   Sherren Mocha, MD  loperamide (IMODIUM) 2 MG capsule Take 2 mg by mouth 3 (three) times daily as needed for diarrhea or loose stools. For diarrhea    [provider]  LORazepam (ATIVAN) 0.5 MG tablet Take 1 tablet (0.5 mg total) by mouth at bedtime. As needed for sleep Patient taking differently: Take 0.5 mg by mouth at bedtime as needed for sleep.  07/11/15   Renato Shin, MD  Melatonin 3 MG TABS Take 3 mg by mouth at bedtime as needed (for sleep).     [provider]  Multiple Vitamin (MULTIVITAMIN) tablet Take 1 tablet by mouth daily.    [provider]  NITROSTAT 0.4 MG SL tablet PLACE 1 TABLET UNDER THE TONGUE EVERY 5 MINUTES X3 DOSES AS NEEDED FOR CHEST PAIN 11/19/15   Sherren Mocha, MD  ondansetron [facility] ODT) 8 MG disintegrating tablet Take 1 tablet (8 mg total) by mouth every 8 (eight) hours as needed for nausea or vomiting. 05/07/16   Armbruster, Renelda Loma, MD  predniSONE (DELTASONE) 5 MG tablet TAKE 1 TABLET (5 MG TOTAL) BY MOUTH DAILY. 10/25/15   Kathrynn Ducking, MD  promethazine (PHENERGAN) 12.5 MG tablet Take 1 tablet (12.5 mg total) by mouth every 6 (six) hours as needed for nausea or vomiting. 06/28/15   Love, Ivan Anchors, PA-C  pyridostigmine (MESTINON) 60 MG tablet Take 1 tablet (60 mg total) by mouth 3 (three) times daily. Patient taking differently: Take 60 mg by mouth daily.  09/03/15   Kathrynn Ducking, MD  rosuvastatin (CRESTOR) 10 MG tablet TAKE 1/2 TABLETS BY MOUTH DAILY. Patient  taking differently: Take 5 mg by mouth once a day 09/09/15   Sherren Mocha, MD  traMADol (ULTRAM) 50 MG tablet Take 1 tablet (50 mg total) by mouth every 6 (six) hours as needed. 02/21/16   Renato Shin, MD    Family History Family History  Problem Relation Age of Onset  . Heart disease Mother   .  Heart attack Mother   . Stroke Mother   . Heart disease Father   . Heart attack Father   . Stroke Father   . Breast cancer Sister        Twin   . Heart attack Sister   . Dementia Sister   . Heart disease Sister   . Heart attack Sister   . Clotting disorder Sister   . Heart attack Sister   . Colon cancer Neg Hx     Social History Social History  Substance Use Topics  . Smoking status: Never Smoker  . Smokeless tobacco: Never Used  . Alcohol use No     Allergies   Phenergan [promethazine hcl]; Zofran [ondansetron hcl]; and Sulfamethoxazole   Review of Systems Review of Systems  Constitutional: Negative for chills and fever.  Eyes: Positive for visual disturbance (double vision).  Respiratory: Negative for shortness of breath.   Cardiovascular: Negative for chest pain, palpitations and leg swelling.  Gastrointestinal: Negative for abdominal pain, diarrhea, nausea and vomiting.  Genitourinary: Negative for dysuria.  Musculoskeletal: Negative for back pain and neck pain.  Neurological: Positive for speech difficulty (chronic), weakness (chornic), light-headedness and headaches. Negative for dizziness and numbness.  All other systems reviewed and are negative.    Physical Exam Updated Vital Signs BP 139/71   Pulse 61   Temp 98.1 F (36.7 C) (Oral)   Resp 17   Ht 5' 7"  (1.702 m)   Wt 79.4 kg (175 lb)   SpO2 97%   BMI 27.41 kg/m   Physical Exam  Constitutional: He appears well-developed and well-nourished.  HENT:  Head: Normocephalic and atraumatic.  Eyes: Conjunctivae are normal.  Neck: Neck supple.  Cardiovascular: Normal rate and regular rhythm.   No murmur  heard. Pulmonary/Chest: Effort normal and breath sounds normal. No respiratory distress.  Abdominal: Soft. There is no tenderness.  Musculoskeletal: He exhibits no edema.  Neurological: He is alert. No cranial nerve deficit or sensory deficit. GCS eye subscore is 4. GCS verbal subscore is 5. GCS motor subscore is 6.  Intention tremor on the left side. Mild decreased strength in the left side compared to the right. Intermittent expressive aphasia.  Skin: Skin is warm and dry.  Psychiatric: He has a normal mood and affect.  Nursing [name] and vitals reviewed.    ED Treatments / Results  Labs (all labs ordered are listed, but only abnormal results are displayed) Labs Reviewed  CBC - Abnormal; Notable for the following:       Result Value   RBC 3.65 (*)    Hemoglobin 11.7 (*)    HCT 35.7 (*)    All other components within normal limits  COMPREHENSIVE METABOLIC PANEL - Abnormal; Notable for the following:    Glucose, Bld 134 (*)    Creatinine, Ser 1.51 (*)    Total Protein 5.5 (*)    GFR calc non Af Amer 42 (*)    GFR calc Af Amer 49 (*)    All other components within normal limits  CBG MONITORING, ED - Abnormal; Notable for the following:    Glucose-Capillary 134 (*)    All other components within normal limits  I-STAT CHEM 8, ED - Abnormal; Notable for the following:    Creatinine, Ser 1.60 (*)    Glucose, Bld 128 (*)    Calcium, Ion 1.14 (*)    Hemoglobin 11.6 (*)    HCT 34.0 (*)    All other components within normal limits  PROTIME-INR  APTT  DIFFERENTIAL  I-STAT TROPOININ, ED    EKG  EKG Interpretation None       Radiology Ct Head Code Stroke W/o Cm  Result Date: 07/01/2016 CLINICAL DATA:  Code stroke.  Dizziness and double vision EXAM: CT HEAD WITHOUT CONTRAST TECHNIQUE: Contiguous axial images were obtained from the base of the skull through the vertex without intravenous contrast. COMPARISON:  Head CT 06/23/2015 FINDINGS: Brain: No mass lesion,  intraparenchymal hemorrhage or extra-axial collection. No evidence of acute cortical infarct. There is an old left cerebellar infarct again seen. There is periventricular hypoattenuation compatible with chronic microvascular disease. Vascular: Atherosclerotic calcification of the vertebral and internal carotid arteries at the skull base. Skull: Normal visualized skull base, calvarium and extracranial soft tissues. Sinuses/Orbits: No sinus fluid levels or advanced mucosal thickening. No mastoid effusion. Normal orbits. ASPECTS [employer] Stroke Program Early CT Score) - Ganglionic level infarction (caudate, lentiform nuclei, internal capsule, insula, M1-M3 cortex): 7 - Supraganglionic infarction (M4-M6 cortex): 3 Total score (0-10 with 10 being normal): 10 IMPRESSION: 1. No acute intracranial abnormality.  Old left cerebellar infarct. 2. ASPECTS is 10. These results were called by telephone at the time of interpretation on 07/01/2016 at 10:04 pm to Dr. Isla Pence , who verbally acknowledged these results. Electronically Signed   By: Ulyses Jarred M.D.   On: 07/01/2016 22:06    Procedures Procedures (including critical care time)  Medications Ordered in ED Medications  apixaban (ELIQUIS) tablet 5 mg (not administered)  LORazepam (ATIVAN) tablet 1 mg (not administered)  sodium chloride 0.9 % bolus 1,000 mL (1,000 mLs Intravenous New Bag/Given 07/01/16 2244)     Initial Impression / Assessment and Plan / ED Course  I have reviewed the triage vital signs and the nursing notes.  Pertinent labs & imaging results that were available during my care of the patient were reviewed by me and considered in my medical decision making (see chart for details).     Patient is high risk. Has history of CVA with residual deficits. His double vision is completely resolved at this point. Still reporting some lightheadedness and a mild headache. Code stroke initially called. No evidence of acute intracranial pathology  such as bleed. Neurology evaluated the patient. They do not think that this is a CVA.  Code stroke canceled. Not entirely clear what causing the patient's symptoms. Possible TIA. Per neurology, would recommend getting an MRI while inpatient. Also possible presyncopal event that could've caused him to have double vision. We will give the patient some IV fluids here. Patient also does have a history of myasthenia gravis. He reports he has never had these type symptoms with a myasthenia flare. Doubt myasthenia flare given resolution of symptoms.  Labs here are reassuring. Creatinine at baseline. Normal electrolytes. Baseline anemia. No leukocytosis or left shift. Troponin negative.  Patient does have some deficits on exam with left-sided weakness compared to the right, left-sided intention tremor, expressive aphasia. Per the patient and wife, this is consistent with his baseline.  We'll admit the patient for further workup and observation.  Final Clinical Impressions(s) / ED Diagnoses   Final diagnoses:  Double vision  Lightheadedness    New Prescriptions New Prescriptions   No medications on file     Maryan Puls, MD 07/01/16 0712    Isla Pence, MD 07/06/16 1251

## 2016-07-01 NOTE — ED Notes (Addendum)
Onset at 715 pm

## 2016-07-01 NOTE — ED Triage Notes (Signed)
Pt here for double vision and dizziness onset while at church double vision is subsided but dizziness remains.pt had stroke 1 year ago

## 2016-07-01 NOTE — ED Notes (Signed)
ED Provider at bedside. 

## 2016-07-02 ENCOUNTER — Encounter (HOSPITAL_COMMUNITY): Payer: Self-pay | Admitting: *Deleted

## 2016-07-02 ENCOUNTER — Observation Stay (HOSPITAL_BASED_OUTPATIENT_CLINIC_OR_DEPARTMENT_OTHER): Payer: Medicare Other

## 2016-07-02 ENCOUNTER — Telehealth: Payer: Self-pay | Admitting: Neurology

## 2016-07-02 ENCOUNTER — Observation Stay (HOSPITAL_COMMUNITY): Payer: Medicare Other

## 2016-07-02 DIAGNOSIS — I130 Hypertensive heart and chronic kidney disease with heart failure and stage 1 through stage 4 chronic kidney disease, or unspecified chronic kidney disease: Secondary | ICD-10-CM | POA: Diagnosis not present

## 2016-07-02 DIAGNOSIS — C9210 Chronic myeloid leukemia, BCR/ABL-positive, not having achieved remission: Secondary | ICD-10-CM | POA: Diagnosis not present

## 2016-07-02 DIAGNOSIS — F40240 Claustrophobia: Secondary | ICD-10-CM | POA: Diagnosis not present

## 2016-07-02 DIAGNOSIS — E1122 Type 2 diabetes mellitus with diabetic chronic kidney disease: Secondary | ICD-10-CM | POA: Diagnosis not present

## 2016-07-02 DIAGNOSIS — H532 Diplopia: Secondary | ICD-10-CM | POA: Diagnosis not present

## 2016-07-02 DIAGNOSIS — G7000 Myasthenia gravis without (acute) exacerbation: Secondary | ICD-10-CM | POA: Diagnosis not present

## 2016-07-02 DIAGNOSIS — I5032 Chronic diastolic (congestive) heart failure: Secondary | ICD-10-CM | POA: Diagnosis not present

## 2016-07-02 DIAGNOSIS — N183 Chronic kidney disease, stage 3 (moderate): Secondary | ICD-10-CM | POA: Diagnosis not present

## 2016-07-02 DIAGNOSIS — I69354 Hemiplegia and hemiparesis following cerebral infarction affecting left non-dominant side: Secondary | ICD-10-CM | POA: Diagnosis not present

## 2016-07-02 DIAGNOSIS — I639 Cerebral infarction, unspecified: Secondary | ICD-10-CM | POA: Diagnosis not present

## 2016-07-02 DIAGNOSIS — I2510 Atherosclerotic heart disease of native coronary artery without angina pectoris: Secondary | ICD-10-CM | POA: Diagnosis not present

## 2016-07-02 DIAGNOSIS — R531 Weakness: Secondary | ICD-10-CM

## 2016-07-02 DIAGNOSIS — I5030 Unspecified diastolic (congestive) heart failure: Secondary | ICD-10-CM

## 2016-07-02 DIAGNOSIS — R42 Dizziness and giddiness: Secondary | ICD-10-CM | POA: Diagnosis not present

## 2016-07-02 DIAGNOSIS — D630 Anemia in neoplastic disease: Secondary | ICD-10-CM | POA: Diagnosis not present

## 2016-07-02 LAB — MAGNESIUM: Magnesium: 1.9 mg/dL (ref 1.7–2.4)

## 2016-07-02 LAB — CBC WITH DIFFERENTIAL/PLATELET
Basophils Absolute: 0.1 10*3/uL (ref 0.0–0.1)
Basophils Relative: 1 %
EOS PCT: 3 %
Eosinophils Absolute: 0.3 10*3/uL (ref 0.0–0.7)
HCT: 35.7 % — ABNORMAL LOW (ref 39.0–52.0)
Hemoglobin: 11.9 g/dL — ABNORMAL LOW (ref 13.0–17.0)
LYMPHS ABS: 1.8 10*3/uL (ref 0.7–4.0)
Lymphocytes Relative: 21 %
MCH: 32.7 pg (ref 26.0–34.0)
MCHC: 33.3 g/dL (ref 30.0–36.0)
MCV: 98.1 fL (ref 78.0–100.0)
MONO ABS: 1.1 10*3/uL — AB (ref 0.1–1.0)
Monocytes Relative: 13 %
Neutro Abs: 5.2 10*3/uL (ref 1.7–7.7)
Neutrophils Relative %: 62 %
Platelets: 162 10*3/uL (ref 150–400)
RBC: 3.64 MIL/uL — AB (ref 4.22–5.81)
RDW: 13.2 % (ref 11.5–15.5)
WBC: 8.5 10*3/uL (ref 4.0–10.5)

## 2016-07-02 LAB — COMPREHENSIVE METABOLIC PANEL
ALBUMIN: 3.2 g/dL — AB (ref 3.5–5.0)
ALK PHOS: 42 U/L (ref 38–126)
ALT: 24 U/L (ref 17–63)
ANION GAP: 6 (ref 5–15)
AST: 27 U/L (ref 15–41)
BILIRUBIN TOTAL: 0.7 mg/dL (ref 0.3–1.2)
BUN: 10 mg/dL (ref 6–20)
CALCIUM: 8.2 mg/dL — AB (ref 8.9–10.3)
CO2: 26 mmol/L (ref 22–32)
CREATININE: 1.31 mg/dL — AB (ref 0.61–1.24)
Chloride: 109 mmol/L (ref 101–111)
GFR calc Af Amer: 58 mL/min — ABNORMAL LOW (ref >60)
GFR calc non Af Amer: 50 mL/min — ABNORMAL LOW (ref >60)
GLUCOSE: 104 mg/dL — AB (ref 65–99)
Potassium: 3.4 mmol/L — ABNORMAL LOW (ref 3.5–5.1)
Sodium: 141 mmol/L (ref 135–145)
TOTAL PROTEIN: 4.8 g/dL — AB (ref 6.5–8.1)

## 2016-07-02 LAB — ECHOCARDIOGRAM COMPLETE
HEIGHTINCHES: 67 in
Weight: 2969.6 oz

## 2016-07-02 LAB — TSH: TSH: 1.996 u[IU]/mL (ref 0.350–4.500)

## 2016-07-02 LAB — TROPONIN I: Troponin I: <0.03 ng/mL (ref <0.03)

## 2016-07-02 MED ORDER — ACETAMINOPHEN 650 MG RE SUPP: 650.0000 mg | Freq: Four times a day (QID) | RECTAL | Status: DC | PRN

## 2016-07-02 MED ORDER — NITROGLYCERIN 0.4 MG SL SUBL: 0.4000 mg | SUBLINGUAL_TABLET | SUBLINGUAL | Status: DC | PRN

## 2016-07-02 MED ORDER — PREDNISONE 10 MG PO TABS
10.0000 mg | ORAL_TABLET | Freq: Every day | ORAL | Status: DC
  Administered 2016-07-02: 10 mg via ORAL
  Filled 2016-07-02: qty 1

## 2016-07-02 MED ORDER — APIXABAN 5 MG PO TABS
5.0000 mg | ORAL_TABLET | Freq: Two times a day (BID) | ORAL | Status: DC
  Administered 2016-07-02 – 2016-07-03 (×3): 5 mg via ORAL
  Filled 2016-07-02 (×3): qty 1

## 2016-07-02 MED ORDER — PREDNISONE 5 MG PO TABS
5.0000 mg | ORAL_TABLET | Freq: Every day | ORAL | Status: DC
  Administered 2016-07-03: 5 mg via ORAL
  Filled 2016-07-02: qty 1

## 2016-07-02 MED ORDER — IMATINIB MESYLATE 100 MG PO TABS
300.0000 mg | ORAL_TABLET | Freq: Every day | ORAL | Status: DC
  Filled 2016-07-02: qty 3

## 2016-07-02 MED ORDER — BRIMONIDINE TARTRATE-TIMOLOL 0.2-0.5 % OP SOLN: 1.0000 [drp] | Freq: Two times a day (BID) | OPHTHALMIC | Status: DC

## 2016-07-02 MED ORDER — TRAMADOL HCL 50 MG PO TABS: 50.0000 mg | ORAL_TABLET | Freq: Four times a day (QID) | ORAL | Status: DC | PRN

## 2016-07-02 MED ORDER — PROMETHAZINE HCL 25 MG PO TABS: 12.5000 mg | ORAL_TABLET | Freq: Four times a day (QID) | ORAL | Status: DC | PRN

## 2016-07-02 MED ORDER — PANTOPRAZOLE SODIUM 40 MG PO TBEC
40.0000 mg | DELAYED_RELEASE_TABLET | Freq: Every day | ORAL | Status: DC
  Administered 2016-07-02 – 2016-07-03 (×2): 40 mg via ORAL
  Filled 2016-07-02 (×2): qty 1

## 2016-07-02 MED ORDER — ROSUVASTATIN CALCIUM 5 MG PO TABS
5.0000 mg | ORAL_TABLET | Freq: Every day | ORAL | Status: DC
  Administered 2016-07-02: 5 mg via ORAL
  Filled 2016-07-02: qty 1

## 2016-07-02 MED ORDER — LISINOPRIL 5 MG PO TABS
5.0000 mg | ORAL_TABLET | Freq: Every day | ORAL | Status: DC
  Administered 2016-07-02 – 2016-07-03 (×2): 5 mg via ORAL
  Filled 2016-07-02 (×2): qty 1

## 2016-07-02 MED ORDER — LIDOCAINE 5 % EX PTCH: 1.0000 | MEDICATED_PATCH | Freq: Every day | CUTANEOUS | Status: DC | PRN

## 2016-07-02 MED ORDER — LORAZEPAM 0.5 MG PO TABS
0.5000 mg | ORAL_TABLET | Freq: Every evening | ORAL | Status: DC | PRN
  Administered 2016-07-02: 0.5 mg via ORAL
  Filled 2016-07-02: qty 1

## 2016-07-02 MED ORDER — PYRIDOSTIGMINE BROMIDE 60 MG PO TABS
60.0000 mg | ORAL_TABLET | Freq: Every day | ORAL | Status: DC
  Administered 2016-07-02 – 2016-07-03 (×2): 60 mg via ORAL
  Filled 2016-07-02 (×2): qty 1

## 2016-07-02 MED ORDER — ADULT MULTIVITAMIN W/MINERALS CH
1.0000 | ORAL_TABLET | Freq: Every day | ORAL | Status: DC
  Administered 2016-07-02 – 2016-07-03 (×2): 1 via ORAL
  Filled 2016-07-02 (×2): qty 1

## 2016-07-02 MED ORDER — PREDNISONE 5 MG PO TABS: 5.0000 mg | ORAL_TABLET | Freq: Every day | ORAL | Status: DC

## 2016-07-02 MED ORDER — ISOSORBIDE MONONITRATE ER 30 MG PO TB24
30.0000 mg | ORAL_TABLET | Freq: Every day | ORAL | Status: DC
  Administered 2016-07-02 – 2016-07-03 (×2): 30 mg via ORAL
  Filled 2016-07-02 (×2): qty 1

## 2016-07-02 MED ORDER — ACETAMINOPHEN 325 MG PO TABS: 650.0000 mg | ORAL_TABLET | Freq: Four times a day (QID) | ORAL | Status: DC | PRN

## 2016-07-02 MED ORDER — SODIUM CHLORIDE 0.9 % IV SOLN
INTRAVENOUS | Status: DC
  Administered 2016-07-02: 04:00:00 via INTRAVENOUS

## 2016-07-02 MED ORDER — IMATINIB MESYLATE 100 MG PO TABS
300.0000 mg | ORAL_TABLET | Freq: Every day | ORAL | Status: DC
  Administered 2016-07-02 – 2016-07-03 (×2): 300 mg via ORAL
  Filled 2016-07-02 (×2): qty 3

## 2016-07-02 MED ORDER — BRIMONIDINE TARTRATE 0.2 % OP SOLN
1.0000 [drp] | Freq: Two times a day (BID) | OPHTHALMIC | Status: DC
  Administered 2016-07-02 – 2016-07-03 (×3): 1 [drp] via OPHTHALMIC
  Filled 2016-07-02: qty 5

## 2016-07-02 MED ORDER — TIMOLOL MALEATE 0.5 % OP SOLN
1.0000 [drp] | Freq: Two times a day (BID) | OPHTHALMIC | Status: DC
  Administered 2016-07-02 – 2016-07-03 (×3): 1 [drp] via OPHTHALMIC
  Filled 2016-07-02: qty 5

## 2016-07-02 NOTE — Evaluation (Signed)
Physical Therapy Evaluation Patient Details Name: [name] MRN: [id number] DOB: 08/23/1940 Today's Date: 07/02/2016   History of Present Illness  76 y.o. male admitted to [facility] on 07/01/16 for stroke like symptoms of increased difficulty speaking and double vision. MRI negative.  Pt thought to have had a TIA. Pt with singificant PMHx of stroke, PE, ptosis, myasthenia gravis, glaucoma, DVT, DJD, DM, CKD, chronic back pain s/p 4 surgeries, CAD, and L shoulder surgery.  Clinical Impression  Per pt and wife report his current, mildly unsteady gait pattern his his baseline and he is active in OP PT.  I recommend he resume his therapy at discharge and since this is baseline, I do not feel he needs acute therapy services.  PT to sign off.     Follow Up Recommendations Outpatient PT;Other (comment) (resume OP PT for his back therapy at [facility])    Equipment Recommendations  None recommended by PT    Recommendations for Other Services   NA    Precautions / Restrictions Precautions Precautions: Fall Precaution Comments: uses a cane at baseline.       Mobility  Bed Mobility Overal bed mobility: Independent                Transfers Overall transfer level: Needs assistance Equipment used: None Transfers: Sit to/from Stand Sit to Stand: Supervision         General transfer comment: supervision for safety  Ambulation/Gait Ambulation/Gait assistance: Supervision Ambulation Distance (Feet): 300 Feet Assistive device:  (IV pole as a "cane") Gait Pattern/deviations: Step-through pattern;Ataxic Gait velocity: decreased Gait velocity interpretation: Below normal speed for age/gender General Gait Details: Pt is generally steady during gait, but then has these moments of time where he freezes, his right side gets rigid, he stops and then re starts his gait. Per pt and wife report this is normal for him and is part of why he uses a cane for longer distances.          Balance  Overall balance assessment: Needs assistance Sitting-balance support: Feet supported;No upper extremity supported Sitting balance-Leahy Scale: Good     Standing balance support: No upper extremity supported;Bilateral upper extremity supported;Single extremity supported Standing balance-Leahy Scale: Good Standing balance comment: statically he is very stable, some deficits dynamically that can be compensated for by using a cane.                              Pertinent Vitals/Pain Pain Assessment: No/denies pain    Home Living Family/patient expects to be discharged to:: Private residence Living Arrangements: Spouse/significant other Available Help at Discharge: Family Type of Home: House                Prior Function Level of Independence: Independent with assistive device(s);Needs assistance   Gait / Transfers Assistance Needed: pt is mod I at times with cane.  Per pt he uses a cane for stability when going long distances in the community, but not typically anything inside of the home.   ADL's / Homemaking Assistance Needed: Pt no longer does his yardwork (which was his primary chore before the stroke and back surgery).          Hand Dominance   Dominant Hand: Right    Extremity/Trunk Assessment   Upper Extremity Assessment Upper Extremity Assessment: Overall WFL for tasks assessed    Lower Extremity Assessment Lower Extremity Assessment: Overall WFL for tasks assessed (some right sided  coordination issues. )    Cervical / Trunk Assessment Cervical / Trunk Assessment: Other exceptions Cervical / Trunk Exceptions: h/o chronic low back pain and 4 surgeries per pt report.   He reports he is currently in outpatient PT for his low back in [place]  Communication   Communication: Expressive difficulties  Cognition Arousal/Alertness: Awake/alert Behavior During Therapy: WFL for tasks assessed/performed Overall Cognitive Status: History of cognitive  impairments - at baseline                                               Assessment/Plan    PT Assessment All further PT needs can be met in the next venue of care  PT Problem List Decreased balance;Decreased mobility;Decreased coordination;Decreased cognition;Decreased knowledge of use of DME       PT Treatment Interventions      PT Goals (Current goals can be found in the Care Plan section)  Acute Rehab PT Goals Patient Stated Goal: to get back home PT Goal Formulation: All assessment and education complete, DC therapy               AM-PAC PT "6 Clicks" Daily Activity  Outcome Measure Difficulty turning over in bed (including adjusting bedclothes, sheets and blankets)?: None Difficulty moving from lying on back to sitting on the side of the bed? : None Difficulty sitting down on and standing up from a chair with arms (e.g., wheelchair, bedside commode, etc,.)?: None Help needed moving to and from a bed to chair (including a wheelchair)?: None Help needed walking in hospital room?: None Help needed climbing 3-5 steps with a railing? : A Little 6 Click Score: 23    End of Session Equipment Utilized During Treatment: Gait belt Activity Tolerance: Patient tolerated treatment well Patient left: in bed;with call bell/phone within reach;with family/visitor present;Other (comment) (with ECHO tech coming into the room. )   PT Visit Diagnosis: Unsteadiness on feet (R26.81);Ataxic gait (R26.0);Other abnormalities of gait and mobility (R26.89)    Time: 5027-7412 PT Time Calculation (min) (ACUTE ONLY): 18 min   Charges:       Wells Guiles B. Avin Upperman, PT, DPT [phone]   PT Evaluation $PT Eval Moderate Complexity: 1 Procedure     07/02/2016, 3:22 PM

## 2016-07-02 NOTE — H&P (Signed)
History and Physical    [name]:[id number] DOB: December 30, 1940 DOA: 07/01/2016  PCP: Renato Shin, MD  Patient coming from: Home.  Chief Complaint: Diplopia and dizziness.  HPI: [name] is a 76 y.o. male with history of myasthenia gravis, CAD, hypertension, hyperlipidemia, CML was brought to the outpatient had transient diplopia with dizziness lasting for about 5 minutes while patient was in the church. Denies any new weakness of the extremities or any difficulty swallowing or speaking. Patient states that he did not have any chest pain or shortness of breath but did have mild palpitation of his episode.  ED Course: By the time patient reached ER patients symptoms have resolved. 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 CT head followed by MRI of the brain was unremarkable. Neurologist on call Dr. Cheral Marker was consulted. EKG shows normal sinus rhythm.  Review of Systems: As per HPI, rest all negative.   Past Medical History:  Diagnosis Date  . Bruises easily    d/t being on Eliquis  . Carotid artery disease ([place])    a. Duplex 05/2014: 01-02% RICA, 7-25% LICA, elevated velocities in right subclavian artery, normal left subclavian artery..  . Chronic back pain    HNP  . Chronic kidney disease (CKD)   . Claustrophobia    takes Ativan as needed  . 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 Coronary atherosclerosis of unspecified type of vessel, native or graft    a. cath in 2003 showed 10-20% LM, scattered 20% prox-mid LAD, 30% more distal LAD, 50-60% stenosis of LAD towards apex, 20% Cx, 30% dRCA, focal 95% stenosis in smaller of 2 branches of PDA, EF 60-65%.  . Diabetes mellitus ([place])   . Diarrhea    takes Imodium daily as needed  . Diverticulosis   . Diverticulosis of colon (without mention of hemorrhage)   . DJD (degenerative joint disease)   . DVT (deep venous thrombosis) ([place]) 07/2011  . Esophageal stricture     . Essential hypertension    takes Imdur and Lisinopril daily  . Gastric polyps    benign  . GERD (gastroesophageal reflux disease)    takes Nexium daily  . Glaucoma    uses eye drops  . History of bronchitis    not sure when the last time  . History of colon polyps    benign  . History of kidney stones   . History of kidney stones   . Insomnia    takes Melatonin nightly as needed  . Mixed hyperlipidemia    takes Crestor daily  . Myasthenia gravis ([place])    uses prednisone  . Osteoporosis 2016  . Peripheral edema    takes Lasix daily  . Pituitary tumor    checks Dr[facility]Walden at [facility] checks it every Jan   . Pneumonia 01/2016  . PONV (postoperative nausea and vomiting)   . Ptosis   . Pulmonary embolus ([place]) 2012  . Stroke [facility]) 06/2015    Past Surgical History:  Procedure Laterality Date  . BACK SURGERY     x2, lumbar and cervical  . CARDIAC CATHETERIZATION    . CATARACT EXTRACTION Bilateral 12/12  . COLONOSCOPY    . ESOPHAGOGASTRODUODENOSCOPY ENDOSCOPY  04/2015  . IR GENERIC HISTORICAL  03/03/2016   IR IVC FILTER PLMT / S&I [name] GUID/MOD SED 03/03/2016 [name], MD [facility]INTERV RAD  . IR GENERIC HISTORICAL  04/09/2016   IR RADIOLOGIST EVAL &  MGMT 04/09/2016 [name], MD [facility] INTER[facility] RAD  . IR GENERIC HISTORICAL  04/30/2016   IR IVC FILTER RETRIEVAL / S&I [name] GUID/MOD SED 04/30/2016 [name], MD [facility]-INTERV RAD  . LITHOTRIPSY    . LUMBAR LAMINECTOMY/DECOMPRESSION MICRODISCECTOMY Right 09/14/2012   Procedure: Right Lumbar three-four, four-five, Lumbar five-Sacral one decompressive laminectomy;  Surgeon: [name], MD;  Location: [facility];  Service: Neurosurgery;  Laterality: Right;  . LUMBAR LAMINECTOMY/DECOMPRESSION MICRODISCECTOMY Left 03/11/2016   Procedure: Left Lumbar Two-ThreeMicrodiscectomy;  Surgeon: [name], MD;  Location: [facility];  Service: Neurosurgery;  Laterality: Left;  . SHOULDER SURGERY Left 05/07/2009  . TONSILLECTOMY       reports that  he has never smoked. He has never used smokeless tobacco. He reports that he does not drink alcohol or use drugs.  Allergies  Allergen Reactions  . Phenergan [Promethazine Hcl] Swelling    Cannot be combined with Zofran because swelling of the eyes resulted and patient became very restless-  . Zofran [Ondansetron Hcl] Other (See Comments)    Cannot be combined with Phenergan because swelling of the eyes resulted and patient became very restless-  . Sulfamethoxazole Rash and Itching    Family History  Problem Relation Age of Onset  . Heart disease Mother   . Heart attack Mother   . Stroke Mother   . Heart disease Father   . Heart attack Father   . Stroke Father   . Breast cancer Sister        Twin   . Heart attack Sister   . Dementia Sister   . Heart disease Sister   . Heart attack Sister   . Clotting disorder Sister   . Heart attack Sister   . Colon cancer Neg Hx     Prior to Admission medications   Medication Sig Start Date End Date Taking? Authorizing Provider  brimonidine-timolol (COMBIGAN) 0.2-0.5 % ophthalmic solution Place 1 drop into both eyes every 12 (twelve) hours.   Yes [provider]  ELIQUIS 5 MG TABS tablet TAKE 1 TABLET (5 MG TOTAL) BY MOUTH 2 (TWO) TIMES DAILY. 12/09/15  Yes Kathrynn Ducking, MD  esomeprazole (NEXIUM) 40 MG capsule Take 40 mg by mouth daily.    Yes [provider]  furosemide (LASIX) 20 MG tablet TAKE 1 TABLET BY MOUTH DAILY. 12/23/15  Yes Sherren Mocha, MD  imatinib (GLEEVEC) 100 MG tablet Take 2 tablets (200 mg total) by mouth 2 (two) times daily with a meal. Take with meals and large glass of water.Caution:Chemotherapy. Patient taking differently: Take 300 mg by mouth daily. Take with meals and large glass of water.Caution:Chemotherapy. 06/27/15  Yes Love, Ivan Anchors, PA-C  isosorbide mononitrate (IMDUR) 30 MG 24 hr tablet Take 1 tablet (30 mg total) by mouth daily. 09/17/15  Yes Sherren Mocha, MD  lidocaine (LIDODERM) 5 %  Place 1 patch onto the skin daily. Remove & Discard patch within 12 hours or as directed by MD Patient taking differently: Place 1 patch onto the skin daily as needed (pain). Remove & Discard patch within 12 hours or as directed by MD 10/11/15  Yes Renato Shin, MD  lisinopril (PRINIVIL,ZESTRIL) 5 MG tablet Take 1 tablet (5 mg total) by mouth daily. 11/05/15  Yes Sherren Mocha, MD  loperamide (IMODIUM) 2 MG capsule Take 2 mg by mouth 3 (three) times daily as needed for diarrhea or loose stools. For diarrhea   Yes [provider]  LORazepam (ATIVAN) 0.5 MG tablet Take 1  tablet (0.5 mg total) by mouth at bedtime. As needed for sleep Patient taking differently: Take 0.5 mg by mouth at bedtime as needed for sleep.  07/11/15  Yes Renato Shin, MD  Melatonin 3 MG TABS Take 3 mg by mouth at bedtime as needed (for sleep).    Yes [provider]  Multiple Vitamin (MULTIVITAMIN) tablet Take 1 tablet by mouth daily.   Yes [provider]  NITROSTAT 0.4 MG SL tablet PLACE 1 TABLET UNDER THE TONGUE EVERY 5 MINUTES X3 DOSES AS NEEDED FOR CHEST PAIN 11/19/15  Yes Sherren Mocha, MD  ondansetron (ZOFRAN ODT) 8 MG disintegrating tablet Take 1 tablet (8 mg total) by mouth every 8 (eight) hours as needed for nausea or vomiting. 05/07/16  Yes Armbruster, Renelda Loma, MD  predniSONE (DELTASONE) 5 MG tablet TAKE 1 TABLET (5 MG TOTAL) BY MOUTH DAILY. 10/25/15  Yes Kathrynn Ducking, MD  promethazine (PHENERGAN) 12.5 MG tablet Take 1 tablet (12.5 mg total) by mouth every 6 (six) hours as needed for nausea or vomiting. 06/28/15  Yes Love, Ivan Anchors, PA-C  pyridostigmine (MESTINON) 60 MG tablet Take 1 tablet (60 mg total) by mouth 3 (three) times daily. Patient taking differently: Take 60 mg by mouth daily.  09/03/15  Yes Kathrynn Ducking, MD  rosuvastatin (CRESTOR) 10 MG tablet TAKE 1/2 TABLETS BY MOUTH DAILY. Patient taking differently: Take 5 mg by mouth once a day 09/09/15  Yes Sherren Mocha, MD    traMADol (ULTRAM) 50 MG tablet Take 1 tablet (50 mg total) by mouth every 6 (six) hours as needed. Patient taking differently: Take 50 mg by mouth every 6 (six) hours as needed for moderate pain.  02/21/16  Yes Renato Shin, MD    Physical Exam: Vitals:   07/01/16 2300 07/01/16 2330 07/02/16 0015 07/02/16 0245  BP: 139/71 (!) 142/73 (!) 144/61 (!) 152/70  Pulse: 61 69 64 61  Resp: 17 16 18 18   Temp:   98.3 F (36.8 C) 97.9 F (36.6 C)  TempSrc:   Oral   SpO2: 97% 98% 100% 99%  Weight:      Height:          Constitutional: Moderately built and nourished. Vitals:   07/01/16 2300 07/01/16 2330 07/02/16 0015 07/02/16 0245  BP: 139/71 (!) 142/73 (!) 144/61 (!) 152/70  Pulse: 61 69 64 61  Resp: 17 16 18 18   Temp:   98.3 F (36.8 C) 97.9 F (36.6 C)  TempSrc:   Oral   SpO2: 97% 98% 100% 99%  Weight:      Height:       Eyes: Anicteric no pallor. ENMT: No discharge from the ears eyes nose or mouth. Neck: No neck rigidity. No mass felt. No JVD appreciated. Respiratory: No rhonchi or crepitations. Cardiovascular: S1 and S2 heard no murmurs appreciated. Abdomen: Soft nontender bowel sounds present. Musculoskeletal: Mild edema. No joint effusion. Skin: No rash. Skin appears warm. Neurologic: Alert awake oriented to time place and person. Chronic mild left upper and lower extremity weakness from previous stroke. No facial asymmetry. Tongue is midline. Psychiatric: Appears normal. Normal affect.   Labs on Admission: I have personally reviewed following labs and imaging studies  CBC:  Recent Labs Lab 07/01/16 2141 07/01/16 2153  WBC 6.8  --   NEUTROABS 4.3  --   HGB 11.7* 11.6*  HCT 35.7* 34.0*  MCV 97.8  --   PLT 174  --    Basic Metabolic Panel:  Recent Labs  Lab 07/01/16 2141 07/01/16 2153  NA 140 142  K 4.1 4.2  CL 107 105  CO2 25  --   GLUCOSE 134* 128*  BUN 10 12  CREATININE 1.51* 1.60*  CALCIUM 8.9  --    GFR: Estimated Creatinine Clearance: 38.4  mL/min (A) (by C-G formula based on SCr of 1.6 mg/dL (H)). Liver Function Tests:  Recent Labs Lab 07/01/16 2141  AST 23  ALT 28  ALKPHOS 51  BILITOT 0.7  PROT 5.5*  ALBUMIN 3.8   No results for input(s): LIPASE, AMYLASE in the last 168 hours. No results for input(s): AMMONIA in the last 168 hours. Coagulation Profile:  Recent Labs Lab 07/01/16 2141  INR 1.09   Cardiac Enzymes: No results for input(s): CKTOTAL, CKMB, CKMBINDEX, TROPONINI in the last 168 hours. BNP (last 3 results) No results for input(s): PROBNP in the last 8760 hours. HbA1C: No results for input(s): HGBA1C in the last 72 hours. CBG:  Recent Labs Lab 07/01/16 2148  GLUCAP 134*   Lipid Profile: No results for input(s): CHOL, HDL, LDLCALC, TRIG, CHOLHDL, LDLDIRECT in the last 72 hours. Thyroid Function Tests: No results for input(s): TSH, T4TOTAL, FREET4, T3FREE, THYROIDAB in the last 72 hours. Anemia Panel: No results for input(s): VITAMINB12, FOLATE, FERRITIN, TIBC, IRON, RETICCTPCT in the last 72 hours. Urine analysis:    Component Value Date/Time   COLORURINE YELLOW 06/19/2015 2107   APPEARANCEUR CLEAR 06/19/2015 2107   LABSPEC 1.014 06/19/2015 2107   PHURINE 5.5 06/19/2015 2107   GLUCOSEU NEGATIVE 06/19/2015 2107   GLUCOSEU NEGATIVE 04/09/2014 0910   HGBUR NEGATIVE 06/19/2015 2107   BILIRUBINUR NEGATIVE 06/19/2015 2107   KETONESUR 15 (A) 06/19/2015 2107   PROTEINUR NEGATIVE 06/19/2015 2107   UROBILINOGEN 1.0 04/09/2014 0910   NITRITE NEGATIVE 06/19/2015 2107   LEUKOCYTESUR NEGATIVE 06/19/2015 2107   Sepsis Labs: @LABRCNTIP (procalcitonin:4,lacticidven:4) )No results found for this or any previous visit (from the past 240 hour(s)).   Radiological Exams on Admission: Mr Brain Wo Contrast  Result Date: 07/02/2016 CLINICAL DATA:  Initial evaluation for acute diplopia. EXAM: MRI HEAD WITHOUT CONTRAST TECHNIQUE: Multiplanar, multiecho pulse sequences of the brain and surrounding structures  were obtained without intravenous contrast. COMPARISON:  Prior CT from 07/01/2016. FINDINGS: Brain: Diffuse prominence of the CSF containing spaces is compatible with generalized cerebral atrophy. Patchy and confluent T2/FLAIR hyperintensity within the periventricular deep white matter both cerebral hemispheres, nonspecific, but most likely related to chronic microvascular ischemic disease. Remote left cerebellar infarct noted. No abnormal foci of restricted diffusion to suggest acute or subacute ischemia. Gray-white matter differentiation maintained. No evidence for acute or chronic intracranial hemorrhage. No mass lesion, midline shift or mass effect. No hydrocephalus. No extra-axial fluid collection. Major dural sinuses are grossly patent. Pituitary gland suprasellar region normal. Midline structures intact and normal. Vascular: Left vertebral artery not well visualize, likely chronically occluded. Major intracranial vascular flow voids otherwise maintained. Skull and upper cervical spine: Degenerative thickening of the tectorial membrane noted without significant stenosis. Craniocervical junction otherwise unremarkable. Degenerative spondylolysis noted at C3-4 with mild to moderate canal stenosis. Remainder the visualized upper cervical spine otherwise unremarkable. Bone marrow signal intensity within normal limits. No scalp soft tissue abnormality. Sinuses/Orbits: Globes and orbital soft tissues within normal limits. Patient status post lens extraction bilaterally. Moderate mucosal thickening within the right maxillary sinus. Paranasal sinuses are otherwise clear. Small right mastoid effusion noted. Inner ear structures normal. Other: None. IMPRESSION: 1. No acute intracranial process identified. 2. 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 3. Moderate chronic microvascular ischemic disease. 4. Moderate right maxillary sinusitis. Electronically Signed   By: Jeannine Boga M.D.   On: 07/02/2016 02:30   Ct Head Code Stroke W/o Cm  Result Date: 07/01/2016 CLINICAL DATA:  Code stroke.  Dizziness and double vision EXAM: CT HEAD WITHOUT CONTRAST TECHNIQUE: Contiguous axial images were obtained from the base of the skull through the vertex without intravenous contrast. COMPARISON:  Head CT 06/23/2015 FINDINGS: Brain: No mass lesion, intraparenchymal hemorrhage or extra-axial collection. No evidence of acute cortical infarct. There is an old left cerebellar infarct again seen. There is periventricular hypoattenuation compatible with chronic microvascular disease. Vascular: Atherosclerotic calcification of the vertebral and internal carotid arteries at the skull base. Skull: Normal visualized skull base, calvarium and extracranial soft tissues. Sinuses/Orbits: No sinus fluid levels or advanced mucosal thickening. No mastoid effusion. Normal orbits. ASPECTS [facility] Stroke Program Early CT Score) - Ganglionic level infarction (caudate, lentiform nuclei, internal capsule, insula, M1-M3 cortex): 7 - Supraganglionic infarction (M4-M6 cortex): 3 Total score (0-10 with 10 being normal): 10 IMPRESSION: 1. No acute intracranial abnormality.  Old left cerebellar infarct. 2. ASPECTS is 10. These results were called by telephone at the time of interpretation on 07/01/2016 at 10:04 pm to Dr. Isla Pence , who verbally acknowledged these results. Electronically Signed   By: Ulyses Jarred M.D.   On: 07/01/2016 22:06    EKG: Independently reviewed. Normal sinus rhythm.  Assessment/Plan Principal Problem:   Diplopia Active Problems:   Essential hypertension   Ocular myasthenia (HCC)   History of pulmonary embolus ([facility])   Diabetes mellitus type 2 in nonobese [facility])   Coronary artery disease involving native coronary artery of native heart without angina pectoris   Chronic diastolic CHF (congestive heart failure) ([place])    1. Diplopia likely from mild exacerbation from ocular  myasthenia gravis - discussed with Dr. Cheral Marker, neurologist on call. Patient's prednisone dose was increased from 5 mg to 10 mg per neurologist for possible exacerbation of ocular myasthenia gravis. Patient is on Mestinon. 2. Dizziness - closely monitored in telemetry for any arrhythmias. Check 2-D echo. Get physical therapy. Will hold Lasix. Check orthostatics. 3. CAD - denies any chest pain. Patient is on statins Imdur and Apixaban. 4. 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 Pharmacy notified about the dosing. 5. 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 6. Hypertension - on lisinopril and Imdur.   DVT prophylaxis: Apixaban. Code Status: Full code.  Family Communication: Patient's wife.  Disposition Plan: Home.  Consults called: Neurology.  Admission status: Observation.    Rise Patience MD [facility] Pager [phone].  If 7PM-7AM, please contact night-coverage [email or web address] Password [email or web address]  07/02/2016, 3:21 AM

## 2016-07-02 NOTE — Progress Notes (Signed)
FVC 1L NIF -40

## 2016-07-02 NOTE — Progress Notes (Signed)
Patient admitted after midnight, please see H&P.  Patient still c/o weakness as well as trouble with speaking over last few weeks.  He says this is different than his ocular MG episodes.  MRI negative for new CVA.  Echo pending.  PT eval pending.  SLP eval pending.  Tristan Bear DO

## 2016-07-02 NOTE — Telephone Encounter (Signed)
Good morning [name]. No message received - I use text messaging and there is no message of this patient on my phone . C. Mitesh Rosendahl

## 2016-07-02 NOTE — Evaluation (Signed)
Speech Language Pathology Evaluation Patient Details Name: [name] MRN: [id number] DOB: August 12, 1940 Today's Date: 07/02/2016 Time: 0814-4818 SLP Time Calculation (min) (ACUTE ONLY): 24 min  Problem List:  Patient Active Problem List   Diagnosis Date Noted  . Diplopia 07/02/2016  . S/P lumbar laminectomy 03/11/2016  . Radicular pain of left lower extremity 02/21/2016  . Cough 01/10/2016  . 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 Myasthenia gravis ([place])   . Diabetes mellitus type 2 in nonobese (HCC)   . Coronary artery disease involving native coronary artery of native heart without angina pectoris   . Acute blood loss anemia   . Ataxia, post-stroke   . Gait disturbance, post-stroke   . Proximal leg weakness   . Cervical spondylosis without myelopathy   . Achilles tendinitis of right lower extremity   . Chronic diastolic CHF (congestive heart failure) ([place])   . Gastroesophageal reflux disease without esophagitis   . Chronic kidney disease   . 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 Cerebellar stroke ([place])   . Ischemic stroke ([place])   . Cerebrovascular accident (CVA) due to thrombosis of left vertebral artery ([place])   . History of pulmonary embolus (PE)   . Chronic anticoagulation   . HLD (hyperlipidemia)   . Neck pain 03/19/2015  . Renal insufficiency 03/13/2015  . Pain in the chest   . 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 Chest pain syndrome 03/07/2015  . Chronic diastolic heart failure, NYHA class 1 ([place]) 03/07/2015  . Diabetic gastroparesis ([place]) 03/07/2015  . Screening for osteoporosis 07/19/2014  . Routine general medical examination at a health care facility 10/23/2013  . Bilateral carotid bruits 05/24/2013  . Abnormal CT scan, lung 05/24/2013  . Encounter for therapeutic drug monitoring 03/14/2013  . Lymphadenopathy, inguinal 01/11/2013  . Spinal stenosis of lumbar region  06/01/2012  . Myasthenia gravis with exacerbation ([place]) 03/31/2012  . 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 GERD with stricture 09/23/2011  . Long term (current) use of anticoagulants 08/03/2011  . History of pulmonary embolism 07/30/2011  . History of DVT (deep vein thrombosis) 07/30/2011  . Subconjunctival hemorrhage 06/22/2011  . Bilateral conjunctivitis 04/09/2011  . Diabetes mellitus type 2, controlled ([place]) 12/18/2010  . Hearing loss 08/04/2010  . Ptosis of eyelid 12/30/2009  . Essential hypertension 04/24/2009  . HYPERLIPIDEMIA, MIXED 10/05/2008  . HEMORRHOIDS-INTERNAL 07/02/2008  . HEMORRHOIDS-EXTERNAL 07/02/2008  . DIVERTICULOSIS-COLON 07/02/2008  . PERSONAL HX COLONIC POLYPS 07/02/2008  . RASH-NONVESICULAR 02/07/2008  . PNEUMONIA 12/08/2007  . 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 CAD (coronary artery disease) 07/07/2007  . NEPHROLITHIASIS, HX OF 07/07/2007   Past Medical History:  Past Medical History:  Diagnosis Date  . Bruises easily    d/t being on Eliquis  . Carotid artery disease ([place])    a. Duplex 05/2014: 56-31% RICA, 4-97% LICA, elevated velocities in right subclavian artery, normal left subclavian artery..  . Chronic back pain    HNP  . Chronic kidney disease (CKD)   . Claustrophobia    takes Ativan as needed  . 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 Coronary atherosclerosis of unspecified type of vessel, native or graft    a. cath in 2003 showed 10-20% LM, scattered 20% prox-mid LAD, 30% more distal LAD, 50-60% stenosis of LAD towards apex, 20% Cx, 30% dRCA, focal 95% stenosis in smaller of 2 branches of PDA,  EF 60-65%.  . Diabetes mellitus ([place])   . Diarrhea    takes Imodium daily as needed  . Diverticulosis   . Diverticulosis of colon (without mention of hemorrhage)   . DJD (degenerative joint disease)   . DVT (deep venous thrombosis) ([place]) 07/2011  . Esophageal stricture   . Essential hypertension    takes  Imdur and Lisinopril daily  . Gastric polyps    benign  . GERD (gastroesophageal reflux disease)    takes Nexium daily  . Glaucoma    uses eye drops  . History of bronchitis    not sure when the last time  . History of colon polyps    benign  . History of kidney stones   . History of kidney stones   . Insomnia    takes Melatonin nightly as needed  . Mixed hyperlipidemia    takes Crestor daily  . Myasthenia gravis ([place])    uses prednisone  . Osteoporosis 2016  . Peripheral edema    takes Lasix daily  . Pituitary tumor    checks Dr[facility]Walden at [facility] checks it every Jan   . Pneumonia 01/2016  . PONV (postoperative nausea and vomiting)   . Ptosis   . Pulmonary embolus ([place]) 2012  . Stroke [facility]) 06/2015   Past Surgical History:  Past Surgical History:  Procedure Laterality Date  . BACK SURGERY     x2, lumbar and cervical  . CARDIAC CATHETERIZATION    . CATARACT EXTRACTION Bilateral 12/12  . COLONOSCOPY    . ESOPHAGOGASTRODUODENOSCOPY ENDOSCOPY  04/2015  . IR GENERIC HISTORICAL  03/03/2016   IR IVC FILTER PLMT / S&I [name] GUID/MOD SED 03/03/2016 [name], MD [facility]-INTERV RAD  . IR GENERIC HISTORICAL  04/09/2016   IR RADIOLOGIST EVAL & MGMT 04/09/2016 [name], MD [facility] INTERV RAD  . IR GENERIC HISTORICAL  04/30/2016   IR IVC FILTER RETRIEVAL / S&I [name] GUID/MOD SED 04/30/2016 [name], MD [facility]-INTERV RAD  . LITHOTRIPSY    . LUMBAR LAMINECTOMY/DECOMPRESSION MICRODISCECTOMY Right 09/14/2012   Procedure: Right Lumbar three-four, four-five, Lumbar five-Sacral one decompressive laminectomy;  Surgeon: [name], MD;  Location: [place] [facility]URO OR[facility];  Service: Neurosurgery;  Laterality: Right;  . LUMBAR LAMINECTOMY/DECOMPRESSION MICRODISCECTOMY Left 03/11/2016   Procedure: Left Lumbar Two-ThreeMicrodiscectomy;  Surgeon: [name], MD;  Location: [facility];  Service: Neurosurgery;  Laterality: Left;  . SHOULDER SURGERY Left 05/07/2009  . TONSILLECTOMY     HPI:  Tristan Adrian Powersis a 76  y.o.malewith history of myasthenia gravis, CAD, hypertension, hyperlipidemia, cerebellar CVA, CML admitted with transient diplopia with dizziness lasting for about 5 minutes while patient was in the church. Denies any new weakness of the extremities or any difficulty swallowing or speaking. Patient states that he did not have any chest pain or shortness of breath but did have mild palpitation of his episode. MRI no acute intracranial process identified. MD note report pt difficulty speaking past few weeks which is different from his "ocular myasthenia gravis episodes".   Assessment / Plan / Recommendation Clinical Impression  Pt exhibits mild dysfluent speech in conversational level marked by prolongations and/or hesitancy of initial phonemes which becomes exacerbated when he is excited or nervous. SLP educated pt to identify dysfluency episodes, stop, inhale/exhale and re-initiate speech with relaxed/untensed facial and lingual musculature. Pt administered the [facility] which he scored 23/30 with impairments in memory, language subtest (fluency and repetition). SLP reviewed strategies for to recall events and participatory memory with examples and discussion.  Pt, wife and SLP agree he does not presently need further ST services but recommend outpatient ST if dysfluency worsens.     SLP Assessment  SLP Recommendation/Assessment: Patient does not need any further Speech Lanaguage Pathology Services SLP Visit Diagnosis: Cognitive communication deficit (R41.841)    Follow Up Recommendations  None    Frequency and Duration           SLP Evaluation Cognition  Overall Cognitive Status: History of cognitive impairments - at baseline Arousal/Alertness: Awake/alert Orientation Level: Oriented X4 Attention: Sustained Sustained Attention: Appears intact Memory: Impaired Memory Impairment: Storage deficit;Retrieval deficit;Decreased recall of new information;Decreased short term memory Awareness: Appears  intact Problem Solving: Appears intact Safety/Judgment: Appears intact       Comprehension  Auditory Comprehension Overall Auditory Comprehension: Appears within functional limits for tasks assessed Visual Recognition/Discrimination Discrimination: Not tested Reading Comprehension Reading Status: Not tested    Expression Expression Primary Mode of Expression: Verbal Verbal Expression Overall Verbal Expression: Appears within functional limits for tasks assessed Written Expression Dominant Hand: Right Written Expression: Not tested   Oral / Motor  Oral Motor/Sensory Function Overall Oral Motor/Sensory Function: Within functional limits Motor Speech Overall Motor Speech: Impaired Respiration: Within functional limits Phonation: Normal Resonance: Within functional limits Articulation: Within functional limitis Intelligibility: Intelligible Motor Planning: Witnin functional limits   GO          Functional Assessment Tool Used: skilled clinical observation Functional Limitations: Memory Memory Current Status (U6333): At least 20 percent but less than 40 percent impaired, limited or restricted Memory Goal Status (L4562): At least 20 percent but less than 40 percent impaired, limited or restricted Memory Discharge Status [phone]): At least 20 percent but less than 40 percent impaired, limited or restricted         Houston Siren 07/02/2016, 2:16 PM   Orbie Pyo Colvin Caroli.Ed [employer] [phone]

## 2016-07-02 NOTE — Telephone Encounter (Signed)
I called patient. The patient had an episode yesterday of double vision and fatigue that lasted 5-10 minutes and then cleared. He did not note any numbness or weakness of one side of the body or the other, no slurred speech.  The patient promptly went to the emergency room, he has been admitted for a TIA workup. MRI of the brain does not show an acute stroke.  The prednisone dose has been increased, the episode described by the patient does not appear to be consistent with myasthenia gravis.  Other than being tired from no sleep last night, the patient appears to feel that he is back to his usual baseline.

## 2016-07-02 NOTE — Consult Note (Addendum)
NEURO HOSPITALIST CONSULT NOTE   Requestig physician: Dr. Eliseo Squires  Reason for Consult: Transient binocular double vision  History obtained from:  Patient and Chart     HPI:                                                                                                                                          [name] is an 76 y.o. male with a history of ocular myasthenia gravis. Per his PCPs note, this typically manifests as eyelid drooping and periorbital swelling. On Wednesday, the patient called his PCP regarding a one day history of double vision and fatigue. They had had difficulty reaching his Neurologist and were concerned about possible stroke. 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 He had no trouble breathing or swallowing. The PCPs note describes the episode as most likely being secondary to myasthenia gravis exacerbation, rather than a stroke. The patient elected to be evaluated in the ED to rule out a new stroke. On presentation to the ED, Code Stroke was initiated. On arrival, the patient stated that onset of his double vision was while at church at 7:15 PM. The double vision was binocular and lasted for about 5 minutes, having resolved by the time he arrived at the ED. Also stated that he had some presyncopal dizziness that was still present at the time of the ED evaluation. Diffuse fatigue without new focal weakness was also endorsed.   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 PMHx also includes CAD and carotid atherosclerosis. 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  Home medications include Eliquis. He also takes prednisone 5 mg po qd and Mestinon 60 mg po qd.   Past Medical History:  Diagnosis Date  . Bruises easily    d/t being on Eliquis  . Carotid artery disease ([place])    a. Duplex 05/2014: 35-59% RICA, 7-41% LICA, elevated velocities in right  subclavian artery, normal left subclavian artery..  . Chronic back pain    HNP  . Chronic kidney disease (CKD)   . Claustrophobia    takes Ativan as needed  . 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 Coronary atherosclerosis of unspecified type of vessel, native or graft    a. cath in 2003 showed 10-20% LM, scattered 20% prox-mid LAD, 30% more distal LAD, 50-60% stenosis of LAD towards apex, 20% Cx, 30% dRCA, focal 95% stenosis in smaller of 2 branches of PDA, EF 60-65%.  . Diabetes mellitus ([place])   . Diarrhea    takes Imodium daily as needed  . Diverticulosis   . Diverticulosis of colon (without mention of hemorrhage)   .  DJD (degenerative joint disease)   . DVT (deep venous thrombosis) ([place]) 07/2011  . Esophageal stricture   . Essential hypertension    takes Imdur and Lisinopril daily  . Gastric polyps    benign  . GERD (gastroesophageal reflux disease)    takes Nexium daily  . Glaucoma    uses eye drops  . History of bronchitis    not sure when the last time  . History of colon polyps    benign  . History of kidney stones   . History of kidney stones   . Insomnia    takes Melatonin nightly as needed  . Mixed hyperlipidemia    takes Crestor daily  . Myasthenia gravis ([place])    uses prednisone  . Osteoporosis 2016  . Peripheral edema    takes Lasix daily  . Pituitary tumor    checks Dr[facility]Walden at [facility] checks it every Jan   . Pneumonia 01/2016  . PONV (postoperative nausea and vomiting)   . Ptosis   . Pulmonary embolus ([place]) 2012  . Stroke [facility]) 06/2015    Past Surgical History:  Procedure Laterality Date  . BACK SURGERY     x2, lumbar and cervical  . CARDIAC CATHETERIZATION    . CATARACT EXTRACTION Bilateral 12/12  . COLONOSCOPY    . ESOPHAGOGASTRODUODENOSCOPY ENDOSCOPY  04/2015  . IR GENERIC HISTORICAL  03/03/2016   IR IVC FILTER PLMT / S&I [name] GUID/MOD SED 03/03/2016 [name], MD [facility]INTER[facility] RAD  . IR GENERIC HISTORICAL  04/09/2016    IR RADIOLOGIST EVAL & MGMT 04/09/2016 [name], MD [facility] INTERV RAD  . IR GENERIC HISTORICAL  04/30/2016   IR IVC FILTER RETRIEVAL / S&I [name] GUID/MOD SED 04/30/2016 [name], MD [facility]-INTERV RAD  . LITHOTRIPSY    . LUMBAR LAMINECTOMY/DECOMPRESSION MICRODISCECTOMY Right 09/14/2012   Procedure: Right Lumbar three-four, four-five, Lumbar five-Sacral one decompressive laminectomy;  Surgeon: [name], MD;  Location: [place] NEURO ORS;  Service: Neurosurgery;  Laterality: Right;  . LUMBAR LAMINECTOMY/DECOMPRESSION MICRODISCECTOMY Left 03/11/2016   Procedure: Left Lumbar Two-ThreeMicrodiscectomy;  Surgeon: [name], MD;  Location: [place];  Service: Neurosurgery;  Laterality: Left;  . SHOULDER SURGERY Left 05/07/2009  . TONSILLECTOMY      Family History  Problem Relation Age of Onset  . Heart disease Mother   . Heart attack Mother   . Stroke Mother   . Heart disease Father   . Heart attack Father   . Stroke Father   . Breast cancer Sister        Twin   . Heart attack Sister   . Dementia Sister   . Heart disease Sister   . Heart attack Sister   . Clotting disorder Sister   . Heart attack Sister   . Colon cancer Neg Hx    Social History:  reports that he has never smoked. He has never used smokeless tobacco. He reports that he does not drink alcohol or use drugs.  Allergies  Allergen Reactions  . Phenergan [Promethazine Hcl] Swelling    Cannot be combined with Zofran because swelling of the eyes resulted and patient became very restless-  . Zofran [Ondansetron Hcl] Other (See Comments)    Cannot be combined with Phenergan because swelling of the eyes resulted and patient became very restless-  . Sulfamethoxazole Rash and Itching    MEDICATIONS:  Prior to Admission:  Prescriptions Prior to Admission  Medication Sig Dispense Refill Last Dose  .  brimonidine-timolol (COMBIGAN) 0.2-0.5 % ophthalmic solution Place 1 drop into both eyes every 12 (twelve) hours.   07/01/2016 at Unknown time  . ELIQUIS 5 MG TABS tablet TAKE 1 TABLET (5 MG TOTAL) BY MOUTH 2 (TWO) TIMES DAILY. 60 tablet 3 07/01/2016 at 0900  . esomeprazole (NEXIUM) 40 MG capsule Take 40 mg by mouth daily.    07/01/2016 at Unknown time  . furosemide (LASIX) 20 MG tablet TAKE 1 TABLET BY MOUTH DAILY. 90 tablet 2 07/01/2016 at Unknown time  . imatinib (GLEEVEC) 100 MG tablet Take 2 tablets (200 mg total) by mouth 2 (two) times daily with a meal. Take with meals and large glass of water.Caution:Chemotherapy. (Patient taking differently: Take 300 mg by mouth daily. Take with meals and large glass of water.Caution:Chemotherapy.)   07/01/2016 at Unknown time  . isosorbide mononitrate (IMDUR) 30 MG 24 hr tablet Take 1 tablet (30 mg total) by mouth daily. 90 tablet 3 07/01/2016 at Unknown time  . lidocaine (LIDODERM) 5 % Place 1 patch onto the skin daily. Remove & Discard patch within 12 hours or as directed by MD (Patient taking differently: Place 1 patch onto the skin daily as needed (pain). Remove & Discard patch within 12 hours or as directed by MD) 30 patch 11 unk  . lisinopril (PRINIVIL,ZESTRIL) 5 MG tablet Take 1 tablet (5 mg total) by mouth daily. 30 tablet 11 07/01/2016 at Unknown time  . loperamide (IMODIUM) 2 MG capsule Take 2 mg by mouth 3 (three) times daily as needed for diarrhea or loose stools. For diarrhea   unk  . LORazepam (ATIVAN) 0.5 MG tablet Take 1 tablet (0.5 mg total) by mouth at bedtime. As needed for sleep (Patient taking differently: Take 0.5 mg by mouth at bedtime as needed for sleep. ) 30 tablet 2 unk  . Melatonin 3 MG TABS Take 3 mg by mouth at bedtime as needed (for sleep).    unk  . Multiple Vitamin (MULTIVITAMIN) tablet Take 1 tablet by mouth daily.   07/01/2016 at Unknown time  . NITROSTAT 0.4 MG SL tablet PLACE 1 TABLET UNDER THE TONGUE EVERY 5 MINUTES X3 DOSES AS  NEEDED FOR CHEST PAIN 100 tablet 1 unk  . ondansetron (ZOFRAN ODT) 8 MG disintegrating tablet Take 1 tablet (8 mg total) by mouth every 8 (eight) hours as needed for nausea or vomiting. 90 tablet 1 unk  . predniSONE (DELTASONE) 5 MG tablet TAKE 1 TABLET (5 MG TOTAL) BY MOUTH DAILY. 90 tablet 3 07/01/2016 at Unknown time  . promethazine (PHENERGAN) 12.5 MG tablet Take 1 tablet (12.5 mg total) by mouth every 6 (six) hours as needed for nausea or vomiting. 30 tablet 0 unk  . pyridostigmine (MESTINON) 60 MG tablet Take 1 tablet (60 mg total) by mouth 3 (three) times daily. (Patient taking differently: Take 60 mg by mouth daily. ) 90 tablet 11 07/01/2016 at Unknown time  . rosuvastatin (CRESTOR) 10 MG tablet TAKE 1/2 TABLETS BY MOUTH DAILY. (Patient taking differently: Take 5 mg by mouth once a day) 45 tablet 0 07/01/2016 at Unknown time  . traMADol (ULTRAM) 50 MG tablet Take 1 tablet (50 mg total) by mouth every 6 (six) hours as needed. (Patient taking differently: Take 50 mg by mouth every 6 (six) hours as needed for moderate pain. ) 30 tablet 0 unk     ROS:  As per HPI.   Blood pressure (!) 152/70, pulse 61, temperature 97.9 F (36.6 C), resp. rate 18, height 5\' 7"  (1.702 m), weight 79.4 kg (175 lb), SpO2 99 %.  General Examination:                                                                                                      HEENT-  /AT  Lungs- Respirations unlabored  Extremities- Warm and well-perfused  Neurological Examination Mental Status: Alert, oriented, thought content appropriate.  Speech fluent without evidence of aphasia.  Able to follow all commands without difficulty. Anxious affect.  Cranial Nerves: II: Visual fields grossly normal. PERRL III,IV, VI: ptosis not present, EOMI without nystagmus. No double vision on current exam. V,VII: smile  symmetric, facial temp sensation normal bilaterally VIII: hearing intact to conversation IX,X: no hypophonia, hoarseness or nasal quality to speech XI: symmetric XII: midline tongue extension Motor: Right : Upper extremity   5/5    Left:     Upper extremity   5/5  Lower extremity   5/5     Lower extremity   5/5 Normal tone throughout; no atrophy noted Tremulousness noted in all 4 extremities, with some intermittent giveway weakness. Maximum strength is 5/5 without asymmetry.  Sensory: Temp and light touch intact throughout, bilaterally Deep Tendon Reflexes: 2+ and symmetric throughout Plantars: Right: downgoing   Left: downgoing Cerebellar: No ataxia with FNF bilaterally Gait: Deferred  Lab Results: Basic Metabolic Panel:  Recent Labs Lab 07/01/16 2141 07/01/16 2153  NA 140 142  K 4.1 4.2  CL 107 105  CO2 25  --   GLUCOSE 134* 128*  BUN 10 12  CREATININE 1.51* 1.60*  CALCIUM 8.9  --     Liver Function Tests:  Recent Labs Lab 07/01/16 2141  AST 23  ALT 28  ALKPHOS 51  BILITOT 0.7  PROT 5.5*  ALBUMIN 3.8   No results for input(s): LIPASE, AMYLASE in the last 168 hours. No results for input(s): AMMONIA in the last 168 hours.  CBC:  Recent Labs Lab 07/01/16 2141 07/01/16 2153  WBC 6.8  --   NEUTROABS 4.3  --   HGB 11.7* 11.6*  HCT 35.7* 34.0*  MCV 97.8  --   PLT 174  --     Cardiac Enzymes: No results for input(s): CKTOTAL, CKMB, CKMBINDEX, TROPONINI in the last 168 hours.  Lipid Panel: No results for input(s): CHOL, TRIG, HDL, CHOLHDL, VLDL, LDLCALC in the last 168 hours.  CBG:  Recent Labs Lab 07/01/16 2148  GLUCAP 134*    Microbiology: Results for orders placed or performed during the hospital encounter of 03/04/16  Surgical pcr screen     Status: None   Collection Time: 03/04/16 10:38 AM  Result Value Ref Range Status   MRSA, PCR NEGATIVE NEGATIVE Final   Staphylococcus aureus NEGATIVE NEGATIVE Final    Comment:        The Xpert SA  Assay (FDA approved for NASAL specimens in patients over 11 years of age), is one component of a comprehensive surveillance program.  Test  performance has been validated by [facility] for patients greater than or equal to 35 year old. It is not intended to diagnose infection nor to guide or monitor treatment.     Coagulation Studies:  Recent Labs  07/01/16 2141  LABPROT 14.1  INR 1.09    Imaging: Mr Brain Wo Contrast  Result Date: 07/02/2016 CLINICAL DATA:  Initial evaluation for acute diplopia. EXAM: MRI HEAD WITHOUT CONTRAST TECHNIQUE: Multiplanar, multiecho pulse sequences of the brain and surrounding structures were obtained without intravenous contrast. COMPARISON:  Prior CT from 07/01/2016. FINDINGS: Brain: Diffuse prominence of the CSF containing spaces is compatible with generalized cerebral atrophy. Patchy and confluent T2/FLAIR hyperintensity within the periventricular deep white matter both cerebral hemispheres, nonspecific, but most likely related to chronic microvascular ischemic disease. Remote left cerebellar infarct noted. No abnormal foci of restricted diffusion to suggest acute or subacute ischemia. Gray-white matter differentiation maintained. No evidence for acute or chronic intracranial hemorrhage. No mass lesion, midline shift or mass effect. No hydrocephalus. No extra-axial fluid collection. Major dural sinuses are grossly patent. Pituitary gland suprasellar region normal. Midline structures intact and normal. Vascular: Left vertebral artery not well visualize, likely chronically occluded. Major intracranial vascular flow voids otherwise maintained. Skull and upper cervical spine: Degenerative thickening of the tectorial membrane noted without significant stenosis. Craniocervical junction otherwise unremarkable. Degenerative spondylolysis noted at C3-4 with mild to moderate canal stenosis. Remainder the visualized upper cervical spine otherwise unremarkable. Bone  marrow signal intensity within normal limits. No scalp soft tissue abnormality. Sinuses/Orbits: Globes and orbital soft tissues within normal limits. Patient status post lens extraction bilaterally. Moderate mucosal thickening within the right maxillary sinus. Paranasal sinuses are otherwise clear. Small right mastoid effusion noted. Inner ear structures normal. Other: None. IMPRESSION: 1. No acute intracranial process identified. 2. 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 3. Moderate chronic microvascular ischemic disease. 4. Moderate right maxillary sinusitis. Electronically Signed   By: Jeannine Boga M.D.   On: 07/02/2016 02:30   Ct Head Code Stroke W/o Cm  Result Date: 07/01/2016 CLINICAL DATA:  Code stroke.  Dizziness and double vision EXAM: CT HEAD WITHOUT CONTRAST TECHNIQUE: Contiguous axial images were obtained from the base of the skull through the vertex without intravenous contrast. COMPARISON:  Head CT 06/23/2015 FINDINGS: Brain: No mass lesion, intraparenchymal hemorrhage or extra-axial collection. No evidence of acute cortical infarct. There is an old left cerebellar infarct again seen. There is periventricular hypoattenuation compatible with chronic microvascular disease. Vascular: Atherosclerotic calcification of the vertebral and internal carotid arteries at the skull base. Skull: Normal visualized skull base, calvarium and extracranial soft tissues. Sinuses/Orbits: No sinus fluid levels or advanced mucosal thickening. No mastoid effusion. Normal orbits. ASPECTS [facility] Stroke Program Early CT Score) - Ganglionic level infarction (caudate, lentiform nuclei, internal capsule, insula, M1-M3 cortex): 7 - Supraganglionic infarction (M4-M6 cortex): 3 Total score (0-10 with 10 being normal): 10 IMPRESSION: 1. No acute intracranial abnormality.  Old left cerebellar infarct. 2. ASPECTS is 10. These results were called by telephone at the time of  interpretation on 07/01/2016 at 10:04 pm to Dr. Isla Pence , who verbally acknowledged these results. Electronically Signed   By: Ulyses Jarred M.D.   On: 07/01/2016 22:06    Assessment: 1. Transient binocular double vision, most likely secondary to mild ocular myasthenia gravis exacerbation or uncovering of latent deficit by an intercurrent illness.  2. Diffuse fatigue x 1 day. May have precipitated the double vision. DDx for underlying cause  is broad. Most likely the diffuse fatigue is not due to myasthenia, as he has a diagnosis of ocular myasthenia gravis.  3. CML on Gleevec. 4. Anticoagulated with Eliquis. He has a histoyr of DVT and PE.  5. Left cerebellar stroke in 2017. This is seen on CT obtained in the ED. No new strokes are appreciated on CT. Clinical exam also not consistent with new stroke.   Recommendations: 1. Increase prednisone to 10 mg po qd.  2. Continue Mestinon at current dose of 60 mg po qd. If the patient consents, an increased dose frequency to 60 mg BID may improve symptoms.  3. MRI brain.  4. Evaluation for possible etiologies for his new onset diffuse fatigue and presyncope, such as infection, arrhythmia, pulmonary condition or drug side effect.  5. After discharge, will need outpatient Neurology follow up within 1-2 weeks.   Electronically signed: Dr. Kerney Elbe 07/02/2016, 5:08 AM

## 2016-07-02 NOTE — Progress Notes (Signed)
Subjective: [name] was sitting on the edge of his bed on my arrival. Appears anxious, short of breath. Preparing to eat breakfast  Current Pertinent Medications: Gleevac 300mg  daily Prednisone 10mg  daily Mestinon 60mg  daily  Pertinent Labs/Diagnostics: Calcium 8.2, slightly anemic MRI brain without new pathology  Physical Examination: Vitals:   07/02/16 0245 07/02/16 0552  BP: (!) 152/70 (!) 145/62  Pulse: 61 65  Resp: 18 18  Temp: 97.9 F (36.6 C) 98.2 F (36.8 C)    General: WDWN male.  HEENT:  Normocephalic, no lesions, without obvious abnormality.  Slight swelling of the  external eye bilaterally. Normal external nose, mucus membranes and septum.  Normal pharynx. Cardiovascular: S1, S2 normal, pulses palpable throughout   Pulmonary: chest clear, no wheezing, rales, normal symmetric air entry. Subjective shortness of breath. Able to count to 15 on one breath. Abdomen: Soft, nontender Extremities: no joint deformities, effusion, or inflammation Musculoskeletal: no joint tenderness, deformity or swelling. Normal tone throughout; no atrophy noted  Neurological Examination:  CN: Pupils are equal and round. They are symmetrically reactive from 3-->2 mm. EOMI without nystagmus. Slight ptosis of both eyes, right>left. Facial sensation is intact to light touch. Hearing is intact to conversational voice. Palate elevates symmetrically and uvula is midline. Voice is normal in tone, pitch and quality. Bilateral SCM and trapezii are 4/5. Motor: Normal bulk, tone. Demonstrated give-away strength. 4/5 on the right, 3-/5 on the left. Sensation: Intact to light touch.  DTRs: 2+, symmetric  Toes downgoing bilaterally. No pathologic reflexes.   Tristan Augusta PA-C [facility]  07/02/2016, 10:10 AM   Impression: [name] has a history of myasthenia gravis and cerebellar CVA. With the brevity of the diplopia, history of posterior circulation stroke, and the fact that  this is different than any of his previous myasthenic symptoms, I suspect that this is TIA rather than myasthenic exacerbation. Of note, he is only taking his Mestinon daily, but given that he does not have any clear symptoms I think not changing is reasonable. I discussed that I would recommend going to a lower dose more frequently, but he favored continuing as is and I think this is reasonable.  Recommendations: 1) echo 2) CTA head and neck 3) lipids, a1c 4) continue eliquis.  5) continue home mestinon will follow.   Roland Rack, MD [facility] [phone]  If 7pm- 7am, please page neurology on call as listed in [place].

## 2016-07-03 ENCOUNTER — Telehealth: Payer: Self-pay | Admitting: Endocrinology

## 2016-07-03 ENCOUNTER — Observation Stay (HOSPITAL_COMMUNITY): Payer: Medicare Other

## 2016-07-03 DIAGNOSIS — E119 Type 2 diabetes mellitus without complications: Secondary | ICD-10-CM

## 2016-07-03 DIAGNOSIS — I5032 Chronic diastolic (congestive) heart failure: Secondary | ICD-10-CM

## 2016-07-03 DIAGNOSIS — H532 Diplopia: Secondary | ICD-10-CM | POA: Diagnosis not present

## 2016-07-03 DIAGNOSIS — G459 Transient cerebral ischemic attack, unspecified: Secondary | ICD-10-CM | POA: Diagnosis not present

## 2016-07-03 DIAGNOSIS — I6521 Occlusion and stenosis of right carotid artery: Secondary | ICD-10-CM | POA: Diagnosis not present

## 2016-07-03 LAB — BASIC METABOLIC PANEL
Anion gap: 7 (ref 5–15)
BUN: 8 mg/dL (ref 6–20)
CO2: 25 mmol/L (ref 22–32)
CREATININE: 1.29 mg/dL — AB (ref 0.61–1.24)
Calcium: 8.4 mg/dL — ABNORMAL LOW (ref 8.9–10.3)
Chloride: 108 mmol/L (ref 101–111)
GFR calc Af Amer: 60 mL/min — ABNORMAL LOW (ref >60)
GFR calc non Af Amer: 51 mL/min — ABNORMAL LOW (ref >60)
GLUCOSE: 93 mg/dL (ref 65–99)
Potassium: 4.1 mmol/L (ref 3.5–5.1)
SODIUM: 140 mmol/L (ref 135–145)

## 2016-07-03 LAB — LIPID PANEL
CHOLESTEROL: 104 mg/dL (ref 0–200)
HDL: 33 mg/dL — AB (ref >40)
LDL Cholesterol: 53 mg/dL (ref 0–99)
TRIGLYCERIDES: 91 mg/dL (ref <150)
Total CHOL/HDL Ratio: 3.2 RATIO
VLDL: 18 mg/dL (ref 0–40)

## 2016-07-03 LAB — MAGNESIUM: Magnesium: 1.8 mg/dL (ref 1.7–2.4)

## 2016-07-03 MED ORDER — POTASSIUM CHLORIDE CRYS ER 20 MEQ PO TBCR
40.0000 meq | EXTENDED_RELEASE_TABLET | Freq: Once | ORAL | Status: AC
  Administered 2016-07-03: 40 meq via ORAL
  Filled 2016-07-03: qty 2

## 2016-07-03 MED ORDER — IOPAMIDOL (ISOVUE-370) INJECTION 76%
INTRAVENOUS | Status: AC
  Administered 2016-07-03: 50 mL via INTRAVENOUS
  Filled 2016-07-03: qty 50

## 2016-07-03 NOTE — Progress Notes (Signed)
Pt.had an episode of 2 seconds pause in heart monitor.Pt inhaled corticosteroids asymptomatic,V/S taken;b/p=160/77;hr=65;r=18;o2 sat=99% on ra.DR.Opyd made aware.received orders.Will cont.to monitor pt.

## 2016-07-03 NOTE — Discharge Summary (Addendum)
Physician Discharge Summary  [name] MRN: [id number] DOB/AGE: [birth year] 76 y.o.  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[place])   History of pulmonary embolus (PE)   Diabetes mellitus type 2 in nonobese [facility])   Coronary artery disease involving native coronary artery of native heart without angina pectoris   Chronic diastolic CHF (congestive heart failure) ([place])    Follow-up recommendations Follow-up with PCP in 3-5 days , including all  additional recommended appointments as below Follow-up CBC, CMP in 3-5 days       Current Discharge Medication List    CONTINUE these medications which have NOT CHANGED   Details  brimonidine-timolol (COMBIGAN) 0.2-0.5 % ophthalmic solution Place 1 drop into both eyes every 12 (twelve) hours.    ELIQUIS 5 MG TABS tablet TAKE 1 TABLET (5 MG TOTAL) BY MOUTH 2 (TWO) TIMES DAILY. Qty: 60 tablet, Refills: 3    esomeprazole (NEXIUM) 40 MG capsule Take 40 mg by mouth daily.     furosemide (LASIX) 20 MG tablet TAKE 1 TABLET BY MOUTH DAILY. Qty: 90 tablet, Refills: 2   Associated Diagnoses: Atherosclerosis of native coronary artery of native heart without angina pectoris; Mixed hyperlipidemia; Essential hypertension    imatinib (GLEEVEC) 100 MG tablet Take 2 tablets (200 mg total) by mouth 2 (two) times daily with a meal. Take with meals and large glass of water.Caution:Chemotherapy.    isosorbide mononitrate (IMDUR) 30 MG 24 hr tablet Take 1 tablet (30 mg total) by mouth daily. Qty: 90 tablet, Refills: 3    lidocaine (LIDODERM) 5 % Place 1 patch onto the skin daily. Remove & Discard patch within 12 hours or as directed by MD Qty: 30 patch, Refills: 11    lisinopril (PRINIVIL,ZESTRIL) 5 MG tablet Take 1 tablet (5 mg total) by mouth daily. Qty: 30 tablet, Refills: 11    loperamide  (IMODIUM) 2 MG capsule Take 2 mg by mouth 3 (three) times daily as needed for diarrhea or loose stools. For diarrhea    LORazepam (ATIVAN) 0.5 MG tablet Take 1 tablet (0.5 mg total) by mouth at bedtime. As needed for sleep Qty: 30 tablet, Refills: 2    Melatonin 3 MG TABS Take 3 mg by mouth at bedtime as needed (for sleep).     Multiple Vitamin (MULTIVITAMIN) tablet Take 1 tablet by mouth daily.    NITROSTAT 0.4 MG SL tablet PLACE 1 TABLET UNDER THE TONGUE EVERY 5 MINUTES X3 DOSES AS NEEDED FOR CHEST PAIN Qty: 100 tablet, Refills: 1    ondansetron (ZOFRAN ODT) 8 MG disintegrating tablet Take 1 tablet (8 mg total) by mouth every 8 (eight) hours as needed for nausea or vomiting. Qty: 90 tablet, Refills: 1    predniSONE (DELTASONE) 5 MG tablet TAKE 1 TABLET (10 MG TOTAL) BY MOUTH DAILY. Qty: 90 tablet, Refills: 3    promethazine (PHENERGAN) 12.5 MG tablet Take 1 tablet (12.5 mg total) by mouth every 6 (six) hours as needed for nausea or vomiting. Qty: 30 tablet, Refills: 0    pyridostigmine (MESTINON) 60 MG tablet Take 1 tablet (60 mg total) by mouth 3 (three) times daily. Qty: 90 tablet, Refills: 11    rosuvastatin (CRESTOR) 10 MG tablet TAKE 1/2 TABLETS BY MOUTH DAILY. Qty: 45 tablet, Refills: 0    traMADol (ULTRAM) 50 MG tablet Take 1 tablet (50 mg total)  by mouth every 6 (six) hours as needed. Qty: 30 tablet, Refills: 0         Discharge Condition: Stable   Discharge Instructions Get Medicines reviewed and adjusted: Please take all your medications with you for your next visit with your Primary MD  Please request your Primary MD to go over all hospital tests and procedure/radiological results at the follow up, please ask your Primary MD to get all Hospital records sent to his/her office.  If you experience worsening of your admission symptoms, develop shortness of breath, life threatening emergency, suicidal or homicidal thoughts you must seek medical attention immediately  by calling 911 or calling your MD immediately if symptoms less severe.  You must read complete instructions/literature along with all the possible adverse reactions/side effects for all the Medicines you take and that have been prescribed to you. Take any new Medicines after you have completely understood and accpet all the possible adverse reactions/side effects.   Do not drive when taking Pain medications.   Do not take more than prescribed Pain, Sleep and Anxiety Medications  Special Instructions: If you have smoked or chewed Tobacco in the last 2 yrs please stop smoking, stop any regular Alcohol and or any Recreational drug use.  Wear Seat belts while driving.  Please note  You were cared for by a hospitalist during your hospital stay. Once you are discharged, your primary care physician will handle any further medical issues. Please note that NO REFILLS for any discharge medications will be authorized once you are discharged, as it is imperative that you return to your primary care physician (or establish a relationship with a primary care physician if you do not have one) for your aftercare needs so that they can reassess your need for medications and monitor your lab values.     Allergies  Allergen Reactions  . Phenergan [Promethazine Hcl] Swelling    Cannot be combined with Zofran because swelling of the eyes resulted and patient became very restless-  . Zofran [Ondansetron Hcl] Other (See Comments)    Cannot be combined with Phenergan because swelling of the eyes resulted and patient became very restless-  . Sulfamethoxazole Rash and Itching      Disposition: Outpatient PT   Consults: Neurology    Significant Diagnostic Studies:  Mr Brain 30 Contrast  Result Date: 07/02/2016 CLINICAL DATA:  Initial evaluation for acute diplopia. EXAM: MRI HEAD WITHOUT CONTRAST TECHNIQUE: Multiplanar, multiecho pulse sequences of the brain and surrounding structures were obtained  without intravenous contrast. COMPARISON:  Prior CT from 07/01/2016. FINDINGS: Brain: Diffuse prominence of the CSF containing spaces is compatible with generalized cerebral atrophy. Patchy and confluent T2/FLAIR hyperintensity within the periventricular deep white matter both cerebral hemispheres, nonspecific, but most likely related to chronic microvascular ischemic disease. Remote left cerebellar infarct noted. No abnormal foci of restricted diffusion to suggest acute or subacute ischemia. Gray-white matter differentiation maintained. No evidence for acute or chronic intracranial hemorrhage. No mass lesion, midline shift or mass effect. No hydrocephalus. No extra-axial fluid collection. Major dural sinuses are grossly patent. Pituitary gland suprasellar region normal. Midline structures intact and normal. Vascular: Left vertebral artery not well visualize, likely chronically occluded. Major intracranial vascular flow voids otherwise maintained. Skull and upper cervical spine: Degenerative thickening of the tectorial membrane noted without significant stenosis. Craniocervical junction otherwise unremarkable. Degenerative spondylolysis noted at C3-4 with mild to moderate canal stenosis. Remainder the visualized upper cervical spine otherwise unremarkable. Bone marrow signal intensity within normal limits. No  scalp soft tissue abnormality. Sinuses/Orbits: Globes and orbital soft tissues within normal limits. Patient status post lens extraction bilaterally. Moderate mucosal thickening within the right maxillary sinus. Paranasal sinuses are otherwise clear. Small right mastoid effusion noted. Inner ear structures normal. Other: None. IMPRESSION: 1. No acute intracranial process identified. 2. 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 3. Moderate chronic microvascular ischemic disease. 4. Moderate right maxillary sinusitis. Electronically Signed   By: Jeannine Boga  M.D.   On: 07/02/2016 02:30   Ct Head Code Stroke W/o Cm  Result Date: 07/01/2016 CLINICAL DATA:  Code stroke.  Dizziness and double vision EXAM: CT HEAD WITHOUT CONTRAST TECHNIQUE: Contiguous axial images were obtained from the base of the skull through the vertex without intravenous contrast. COMPARISON:  Head CT 06/23/2015 FINDINGS: Brain: No mass lesion, intraparenchymal hemorrhage or extra-axial collection. No evidence of acute cortical infarct. There is an old left cerebellar infarct again seen. There is periventricular hypoattenuation compatible with chronic microvascular disease. Vascular: Atherosclerotic calcification of the vertebral and internal carotid arteries at the skull base. Skull: Normal visualized skull base, calvarium and extracranial soft tissues. Sinuses/Orbits: No sinus fluid levels or advanced mucosal thickening. No mastoid effusion. Normal orbits. ASPECTS [facility] Stroke Program Early CT Score) - Ganglionic level infarction (caudate, lentiform nuclei, internal capsule, insula, M1-M3 cortex): 7 - Supraganglionic infarction (M4-M6 cortex): 3 Total score (0-10 with 10 being normal): 10 IMPRESSION: 1. No acute intracranial abnormality.  Old left cerebellar infarct. 2. ASPECTS is 10. These results were called by telephone at the time of interpretation on 07/01/2016 at 10:04 pm to Dr. Isla Pence , who verbally acknowledged these results. Electronically Signed   By: Ulyses Jarred M.D.   On: 07/01/2016 22:06        Filed Weights   07/01/16 2144 07/02/16 0552 07/03/16 0442  Weight: 79.4 kg (175 lb) 84.2 kg (185 lb 9.6 oz) 82.7 kg (182 lb 4.8 oz)     Microbiology: No results found for this or any previous visit (from the past 240 hour(s)).     Blood Culture    Component Value Date/Time   SDES SPUTUM 06/18/2013 1646   SPECREQUEST NONE 06/18/2013 1646   CULT KLEBSIELLA PNEUMONIAE 06/07/2012 1626   REPTSTATUS 06/18/2013 FINAL 06/18/2013 1646      Labs: Results for  orders placed or performed during the hospital encounter of 07/01/16 (from the past 48 hour(s))  Protime-INR     Status: None   Collection Time: 07/01/16  9:41 PM  Result Value Ref Range   Prothrombin Time 14.1 11.4 - 15.2 seconds   INR 1.09   APTT     Status: None   Collection Time: 07/01/16  9:41 PM  Result Value Ref Range   aPTT 29 24 - 36 seconds  CBC     Status: Abnormal   Collection Time: 07/01/16  9:41 PM  Result Value Ref Range   WBC 6.8 4.0 - 10.5 K/uL   RBC 3.65 (L) 4.22 - 5.81 MIL/uL   Hemoglobin 11.7 (L) 13.0 - 17.0 g/dL   HCT 35.7 (L) 39.0 - 52.0 %   MCV 97.8 78.0 - 100.0 fL   MCH 32.1 26.0 - 34.0 pg   MCHC 32.8 30.0 - 36.0 g/dL   RDW 13.0 11.5 - 15.5 %   Platelets 174 150 - 400 K/uL  Differential     Status: None   Collection Time: 07/01/16  9:41 PM  Result Value Ref Range   Neutrophils Relative %  63 %   Neutro Abs 4.3 1.7 - 7.7 K/uL   Lymphocytes Relative 25 %   Lymphs Abs 1.7 0.7 - 4.0 K/uL   Monocytes Relative 10 %   Monocytes Absolute 0.7 0.1 - 1.0 K/uL   Eosinophils Relative 2 %   Eosinophils Absolute 0.1 0.0 - 0.7 K/uL   Basophils Relative 0 %   Basophils Absolute 0.0 0.0 - 0.1 K/uL  Comprehensive metabolic panel     Status: Abnormal   Collection Time: 07/01/16  9:41 PM  Result Value Ref Range   Sodium 140 135 - 145 mmol/L   Potassium 4.1 3.5 - 5.1 mmol/L   Chloride 107 101 - 111 mmol/L   CO2 25 22 - 32 mmol/L   Glucose, Bld 134 (H) 65 - 99 mg/dL   BUN 10 6 - 20 mg/dL   Creatinine, Ser 1.51 (H) 0.61 - 1.24 mg/dL   Calcium 8.9 8.9 - 10.3 mg/dL   Total Protein 5.5 (L) 6.5 - 8.1 g/dL   Albumin 3.8 3.5 - 5.0 g/dL   AST 23 15 - 41 U/L   ALT 28 17 - 63 U/L   Alkaline Phosphatase 51 38 - 126 U/L   Total Bilirubin 0.7 0.3 - 1.2 mg/dL   GFR calc non Af Amer 42 (L) >60 mL/min   GFR calc Af Amer 49 (L) >60 mL/min    Comment: (NOTE) The eGFR has been calculated using the CKD EPI equation. This calculation has not been validated in all clinical  situations. eGFR's persistently <60 mL/min signify possible Chronic Kidney Disease.    Anion gap 8 5 - 15  CBG monitoring, ED     Status: Abnormal   Collection Time: 07/01/16  9:48 PM  Result Value Ref Range   Glucose-Capillary 134 (H) 65 - 99 mg/dL  I-stat troponin, ED     Status: None   Collection Time: 07/01/16  9:51 PM  Result Value Ref Range   Troponin i, poc 0.02 0.00 - 0.08 ng/mL   Comment 3            Comment: Due to the release kinetics of cTnI, a negative result within the first hours of the onset of symptoms does not rule out myocardial infarction with certainty. If myocardial infarction is still suspected, repeat the test at appropriate intervals.   I-Stat Chem 8, ED     Status: Abnormal   Collection Time: 07/01/16  9:53 PM  Result Value Ref Range   Sodium 142 135 - 145 mmol/L   Potassium 4.2 3.5 - 5.1 mmol/L   Chloride 105 101 - 111 mmol/L   BUN 12 6 - 20 mg/dL   Creatinine, Ser 1.60 (H) 0.61 - 1.24 mg/dL   Glucose, Bld 128 (H) 65 - 99 mg/dL   Calcium, Ion 1.14 (L) 1.15 - 1.40 mmol/L   TCO2 27 0 - 100 mmol/L   Hemoglobin 11.6 (L) 13.0 - 17.0 g/dL   HCT 34.0 (L) 39.0 - 52.0 %  CBC WITH DIFFERENTIAL     Status: Abnormal   Collection Time: 07/02/16  4:51 AM  Result Value Ref Range   WBC 8.5 4.0 - 10.5 K/uL    Comment: REPEATED TO VERIFY WHITE COUNT CONFIRMED ON SMEAR    RBC 3.64 (L) 4.22 - 5.81 MIL/uL   Hemoglobin 11.9 (L) 13.0 - 17.0 g/dL   HCT 35.7 (L) 39.0 - 52.0 %   MCV 98.1 78.0 - 100.0 fL   MCH 32.7 26.0 - 34.0 pg  MCHC 33.3 30.0 - 36.0 g/dL   RDW 13.2 11.5 - 15.5 %   Platelets 162 150 - 400 K/uL   Neutrophils Relative % 62 %   Neutro Abs 5.2 1.7 - 7.7 K/uL    Comment: CORRECTED ON 05/24 AT 0734: PREVIOUSLY REPORTED AS 5.3   Lymphocytes Relative 21 %    Comment: CORRECTED ON 05/24 AT 0734: PREVIOUSLY REPORTED AS 22   Lymphs Abs 1.8 0.7 - 4.0 K/uL    Comment: CORRECTED ON 05/24 AT 0734: PREVIOUSLY REPORTED AS 1.9   Monocytes Relative 13 %    Monocytes Absolute 1.1 (H) 0.1 - 1.0 K/uL   Eosinophils Relative 3 %   Eosinophils Absolute 0.3 0.0 - 0.7 K/uL    Comment: CORRECTED ON 05/24 AT 0734: PREVIOUSLY REPORTED AS 0.2   Basophils Relative 1 %   Basophils Absolute 0.1 0.0 - 0.1 K/uL    Comment: CORRECTED ON 05/24 AT 0734: PREVIOUSLY REPORTED AS 0.0   RBC Morphology TEARDROP CELLS   Troponin I     Status: None   Collection Time: 07/02/16  4:51 AM  Result Value Ref Range   Troponin I <0.03 <0.03 ng/mL  Comprehensive metabolic panel     Status: Abnormal   Collection Time: 07/02/16  6:37 AM  Result Value Ref Range   Sodium 141 135 - 145 mmol/L   Potassium 3.4 (L) 3.5 - 5.1 mmol/L   Chloride 109 101 - 111 mmol/L   CO2 26 22 - 32 mmol/L   Glucose, Bld 104 (H) 65 - 99 mg/dL   BUN 10 6 - 20 mg/dL   Creatinine, Ser 1.31 (H) 0.61 - 1.24 mg/dL   Calcium 8.2 (L) 8.9 - 10.3 mg/dL   Total Protein 4.8 (L) 6.5 - 8.1 g/dL   Albumin 3.2 (L) 3.5 - 5.0 g/dL   AST 27 15 - 41 U/L   ALT 24 17 - 63 U/L   Alkaline Phosphatase 42 38 - 126 U/L   Total Bilirubin 0.7 0.3 - 1.2 mg/dL   GFR calc non Af Amer 50 (L) >60 mL/min   GFR calc Af Amer 58 (L) >60 mL/min    Comment: (NOTE) The eGFR has been calculated using the CKD EPI equation. This calculation has not been validated in all clinical situations. eGFR's persistently <60 mL/min signify possible Chronic Kidney Disease.    Anion gap 6 5 - 15  Magnesium     Status: None   Collection Time: 07/02/16  6:37 AM  Result Value Ref Range   Magnesium 1.9 1.7 - 2.4 mg/dL  TSH     Status: None   Collection Time: 07/02/16  7:49 AM  Result Value Ref Range   TSH 1.996 0.350 - 4.500 uIU/mL    Comment: Performed by a 3rd Generation assay with a functional sensitivity of <=0.01 uIU/mL.  Lipid panel     Status: Abnormal   Collection Time: 07/03/16  4:03 AM  Result Value Ref Range   Cholesterol 104 0 - 200 mg/dL   Triglycerides 91 <150 mg/dL   HDL 33 (L) >40 mg/dL   Total CHOL/HDL Ratio 3.2 RATIO    VLDL 18 0 - 40 mg/dL   LDL Cholesterol 53 0 - 99 mg/dL    Comment:        Total Cholesterol/HDL:CHD Risk Coronary Heart Disease Risk Table                     Men   Women  1/2 Average  Risk   3.4   3.3  Average Risk       5.0   4.4  2 X Average Risk   9.6   7.1  3 X Average Risk  23.4   11.0        Use the calculated Patient Ratio above and the CHD Risk Table to determine the patient's CHD Risk.        ATP III CLASSIFICATION (LDL):  <100     mg/dL   Optimal  100-129  mg/dL   Near or Above                    Optimal  130-159  mg/dL   Borderline  160-189  mg/dL   High  >190     mg/dL   Very High   Basic metabolic panel     Status: Abnormal   Collection Time: 07/03/16  4:03 AM  Result Value Ref Range   Sodium 140 135 - 145 mmol/L   Potassium 4.1 3.5 - 5.1 mmol/L    Comment: DELTA CHECK NOTED   Chloride 108 101 - 111 mmol/L   CO2 25 22 - 32 mmol/L   Glucose, Bld 93 65 - 99 mg/dL   BUN 8 6 - 20 mg/dL   Creatinine, Ser 1.29 (H) 0.61 - 1.24 mg/dL   Calcium 8.4 (L) 8.9 - 10.3 mg/dL   GFR calc non Af Amer 51 (L) >60 mL/min   GFR calc Af Amer 60 (L) >60 mL/min    Comment: (NOTE) The eGFR has been calculated using the CKD EPI equation. This calculation has not been validated in all clinical situations. eGFR's persistently <60 mL/min signify possible Chronic Kidney Disease.    Anion gap 7 5 - 15  Magnesium     Status: None   Collection Time: 07/03/16  4:03 AM  Result Value Ref Range   Magnesium 1.8 1.7 - 2.4 mg/dL     Lipid Panel     Component Value Date/Time   CHOL 104 07/03/2016 0403   TRIG 91 07/03/2016 0403   HDL 33 (L) 07/03/2016 0403   CHOLHDL 3.2 07/03/2016 0403   VLDL 18 07/03/2016 0403   LDLCALC 53 07/03/2016 0403   LDLDIRECT 59.8 10/12/2012 0737     Lab Results  Component Value Date   HGBA1C 5.7 (H) 06/21/2015   HGBA1C 6.0 (H) 03/07/2015   HGBA1C 5.5 01/17/2015     Lab Results  Component Value Date   MICROALBUR 5.4 (H) 07/19/2014   LDLCALC 53  07/03/2016   CREATININE 1.29 (H) 07/03/2016     HPI :   [name] is a 76 y.o. male with history of myasthenia gravis, CAD, hypertension, hyperlipidemia, CML was brought to the outpatient had transient diplopia with dizziness lasting for about 5 minutes while patient was in the church. Denies any new weakness of the extremities or any difficulty swallowing or speaking. Patient states that he did not have any chest pain or shortness of breath but did have mild palpitation of his episode.  ED Course: By the time patient reached ER patients symptoms have resolved. 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 CT head followed by MRI of the brain was unremarkable. Neurologist on call Dr. Cheral Marker was consulted. EKG shows normal sinus rhythm.  HOSPITAL COURSE   1. Diplopia/posterior circulation TIA- less likely exacerbation from ocular myasthenia gravis as per Dr. Roland Rack- . Patient's prednisone dose was increased from 5  mg to 10 mg per neurologist for possible exacerbation of ocular myasthenia gravis recently. Patient is on Mestinon. Currently undergoing TIA workup. MRI brain shows old left cerebellar infarct, nothing acute. CTA head and neck shows chronic left PICA infarct, ulcerated atherosclerotic plaque in the right internal carotid arteries without stenosis potential source of emboli. Discussed findings with Dr. Leonel Ramsay with neurology. He recommends outpatient vascular surgery evaluation for right internal carotid artery plaque. 2-D echo shows EF of 00-63%, grade 1 diastolic dysfunction 2. Dizziness - likely secondary to posterior circulation TIA, telemetry sinus rhythm with PVCs.  Marland Kitchen   physical therapy recommended outpatient PT.   3. CAD - denies any chest pain. Cardiac enzymes negative. Patient is on statins Imdur and Apixaban. 4. 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 Continue 5. 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 6. Hypertension - on lisinopril  and Imdur. 7. 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 rate 17, height 5' 7"  (1.702 m), weight 82.7 kg (182 lb 4.8 oz), SpO2 98 %.  Cardiovascular: S1 and S2 heard no murmurs appreciated. Abdomen: Soft nontender bowel sounds present. Musculoskeletal: Mild edema. No joint effusion. Skin: No rash. Skin appears warm. Neurologic: Alert awake oriented to time place and person. Chronic mild left upper and lower extremity weakness from previous stroke. No facial asymmetry. Tongue is midline. Psychiatric: Appears normal. Normal affect.     Follow-up Information    Renato Shin, MD. Call.   Specialty:  Endocrinology Why:  hospital follow-up in 3-5 days Contact information: [address] [place] [address] [phone]           Signed: Reyne Dumas 07/03/2016, 11:13 AM        Time spent >45 mins

## 2016-07-03 NOTE — Progress Notes (Signed)
NIF -40 FVC 2L

## 2016-07-03 NOTE — Telephone Encounter (Signed)
8:45 AM, 07/09/16

## 2016-07-03 NOTE — Progress Notes (Signed)
Subjective: Resting comfortably in bed. Wife at bedside. Compared to yesterday: not anxious or SOB. Subjectively stronger than yesterday and would like to go home.   Current Pertinent Medications: Gleevac 300mg  daily Prednisone 5mg  daily Mestinon 60mg  daily  Pertinent Labs/Diagnostics: Calcium 8.4 (may be trending up), slightly anemic MR brain 5/24 - without acute pathology CT angio head/neck pending   Physical Examination: Vitals:   07/03/16 0008 07/03/16 0442  BP: (!) 160/77 (!) 152/63  Pulse: 65 66  Resp: 18 17  Temp:  97.8 F (36.6 C)    General: WDWN male.  HEENT:  Normocephalic, no lesions, without obvious abnormality.  Slight swelling of the  external eye bilaterally. Normal external nose, mucus membranes and septum.  Normal pharynx. Cardiovascular: S1, S2 normal, pulses palpable throughout   Pulmonary: chest clear, no wheezing, rales, normal symmetric air entry. Not SOB. Abdomen: Slightly tense, non-tender Extremities: no joint deformities, effusion, or inflammation Lymphatic: no adenopathy palpable Musculoskeletal: no joint tenderness, deformity or swelling. Normal tone throughout; no atrophy or fasciculations noted Skin: warm and dry, no hyperpigmentation, vitiligo, or suspicious lesions  Neurological Examination:  CN: Pupils are equal and round. They are symmetrically reactive from 3-->2 mm. EOMI with some lateral nystagmus. Slight ptosis of both eyes, right>left - no change from yesterday. Facial sensation is intact to light touch. No hearing aids today, but hearing is intact to loud conversational voice. Palate elevates symmetrically and uvula is midline. Voice is normal in tone, pitch and quality. Bilateral SCM and trapezii are 4/5. Motor: Normal bulk, tone, and strength. 4/5 throughout UE,  4-/5 RLE, 4+/5 LLE. No drift.  Sensation: Intact to light touch.  DTRs: 2+, symmetric  Toes downgoing bilaterally. No pathologic reflexes.  Coordination: Finger-to-nose with  slight tremor and ataxia   Solon Augusta PA-C [facility]  07/03/2016, 10:24 AM   Impression: 76 year old male with likely TIA. His occluded left vertebral artery is old. My suspicion is that this was posterior circulation TIA, and has been evaluated with LDL less than 70, normal A1c, normal echo, CTA. This time, I recommend continuing Eliquis therapy and following up with outpatient neurology  Recommendations:  Continue Eliquis  Roland Rack, MD [facility] [phone]  If 7pm- 7am, please page neurology on call as listed in [place].

## 2016-07-03 NOTE — Progress Notes (Signed)
CM received consult:  Outpatient PT, evaluate and treat resume OP PT for his back therapy at [place] spoke with pt regarding outpatient PT. Pt plans to continue OP PT @ [facility] once d/c. No new order needed 2/2 pt's observation stay. Whitman Hero RN,BSN,CM

## 2016-07-03 NOTE — Care Management Obs Status (Signed)
[place] NOTIFICATION   Patient Details  Name: [name] MRN: [id number] Date of Birth: [birth year]   Medicare Observation Status Notification Given:  Yes (pt declined to sign)    Sharin Mons, RN 07/03/2016, 7:39 AM

## 2016-07-03 NOTE — Telephone Encounter (Signed)
[facility] called [phone] Deneise Lever) to get patient scheduled for a hospital follow up for next week. There were no openings. Please call the number provided to advise.

## 2016-07-03 NOTE — Progress Notes (Signed)
Discharge instructions printed and reviewed with patient and wife, and copy given for them to take home. Reviewed stroke and TIA risk factors, signs and symptoms, treatment plan, anticoagulants, and the importance of seeking emergency medical attention should any symptoms of stroke or TIA recur. All questions addressed at this time. New prescriptions reviewed. IV and telemetry monitoring removed. Room searched for patient belongings, and confirmed with patient that all valuables were accounted for. Spouse assisted patient to dress, then staff escorted patient to discharge via wheelchair.

## 2016-07-04 LAB — HEMOGLOBIN A1C
Hgb A1c MFr Bld: 5.5 % (ref 4.8–5.6)
Mean Plasma Glucose: 111 mg/dL

## 2016-07-07 NOTE — Telephone Encounter (Signed)
LVM for Pt with appointment date and time.

## 2016-07-07 NOTE — Progress Notes (Signed)
   07/02/16 1512  PT G-Codes **NOT FOR INPATIENT CLASS**  Functional Assessment Tool Used AM-PAC 6 Clicks Basic Mobility  Functional Limitation Mobility: Walking and moving around  Mobility: Walking and Moving Around Current Status [phone]) CI  Mobility: Walking and Moving Around Goal Status [phone]) CI  Mobility: Walking and Moving Around Discharge Status [phone]) CI  07/16/2016 Late entry g-codes.  Barbarann Ehlers [place], [place], DPT [phone]

## 2016-07-09 ENCOUNTER — Ambulatory Visit (INDEPENDENT_AMBULATORY_CARE_PROVIDER_SITE_OTHER): Payer: Medicare Other | Admitting: Endocrinology

## 2016-07-09 ENCOUNTER — Encounter: Payer: Self-pay | Admitting: Endocrinology

## 2016-07-09 DIAGNOSIS — I6523 Occlusion and stenosis of bilateral carotid arteries: Secondary | ICD-10-CM | POA: Diagnosis not present

## 2016-07-09 DIAGNOSIS — N183 Chronic kidney disease, stage 3 unspecified: Secondary | ICD-10-CM

## 2016-07-09 DIAGNOSIS — I779 Disorder of arteries and arterioles, unspecified: Secondary | ICD-10-CM

## 2016-07-09 DIAGNOSIS — I739 Peripheral vascular disease, unspecified: Principal | ICD-10-CM

## 2016-07-09 LAB — BASIC METABOLIC PANEL
BUN: 13 mg/dL (ref 6–23)
CALCIUM: 9 mg/dL (ref 8.4–10.5)
CO2: 31 mEq/L (ref 19–32)
Chloride: 105 mEq/L (ref 96–112)
Creatinine, Ser: 1.46 mg/dL (ref 0.40–1.50)
GFR: 49.6 mL/min — AB (ref >60.00)
GLUCOSE: 123 mg/dL — AB (ref 70–99)
POTASSIUM: 4 meq/L (ref 3.5–5.1)
SODIUM: 139 meq/L (ref 135–145)

## 2016-07-09 LAB — VITAMIN D 25 HYDROXY (VIT D DEFICIENCY, FRACTURES): VITD: 26.84 ng/mL — AB (ref 30.00–100.00)

## 2016-07-09 NOTE — Progress Notes (Signed)
Subjective:    Patient ID: [name], male    DOB: 23-Jul-1940, 76 y.o.   MRN: [id number]  HPI Pt was recently in ER with diplopia and dizziness.  He was dx'ed with TIA.  Both sxs are resolved.   Past Medical History:  Diagnosis Date  . Bruises easily    d/t being on Eliquis  . Carotid artery disease ([place])    a. Duplex 05/2014: 03-88% RICA, 8-28% LICA, elevated velocities in right subclavian artery, normal left subclavian artery..  . Chronic back pain    HNP  . Chronic kidney disease (CKD)   . Claustrophobia    takes Ativan as needed  . 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 Coronary atherosclerosis of unspecified type of vessel, native or graft    a. cath in 2003 showed 10-20% LM, scattered 20% prox-mid LAD, 30% more distal LAD, 50-60% stenosis of LAD towards apex, 20% Cx, 30% dRCA, focal 95% stenosis in smaller of 2 branches of PDA, EF 60-65%.  . Diabetes mellitus ([place])   . Diarrhea    takes Imodium daily as needed  . Diverticulosis   . Diverticulosis of colon (without mention of hemorrhage)   . DJD (degenerative joint disease)   . DVT (deep venous thrombosis) ([place]) 07/2011  . Esophageal stricture   . Essential hypertension    takes Imdur and Lisinopril daily  . Gastric polyps    benign  . GERD (gastroesophageal reflux disease)    takes Nexium daily  . Glaucoma    uses eye drops  . History of bronchitis    not sure when the last time  . History of colon polyps    benign  . History of kidney stones   . History of kidney stones   . Insomnia    takes Melatonin nightly as needed  . Mixed hyperlipidemia    takes Crestor daily  . Myasthenia gravis ([place])    uses prednisone  . Osteoporosis 2016  . Peripheral edema    takes Lasix daily  . Pituitary tumor    checks Dr[facility]Walden at [facility] checks it every Jan   . Pneumonia 01/2016  . PONV (postoperative nausea and vomiting)   . Ptosis   . Pulmonary embolus ([place]) 2012  . Stroke [facility]) 06/2015     Past Surgical History:  Procedure Laterality Date  . BACK SURGERY     x2, lumbar and cervical  . CARDIAC CATHETERIZATION    . CATARACT EXTRACTION Bilateral 12/12  . COLONOSCOPY    . ESOPHAGOGASTRODUODENOSCOPY ENDOSCOPY  04/2015  . IR GENERIC HISTORICAL  03/03/2016   IR IVC FILTER PLMT / S&I [name] GUID/MOD SED 03/03/2016 [name], MD [facility]INTER[facility] RAD  . IR GENERIC HISTORICAL  04/09/2016   IR RADIOLOGIST EVAL & MGMT 04/09/2016 [name], MD [facility] INTERV RAD  . IR GENERIC HISTORICAL  04/30/2016   IR IVC FILTER RETRIEVAL / S&I [name] GUID/MOD SED 04/30/2016 [name], MD [facility]-INTERV RAD  . LITHOTRIPSY    . LUMBAR LAMINECTOMY/DECOMPRESSION MICRODISCECTOMY Right 09/14/2012   Procedure: Right Lumbar three-four, four-five, Lumbar five-Sacral one decompressive laminectomy;  Surgeon: [name], MD;  Location: [place] [facility];  Service: Neurosurgery;  Laterality: Right;  . LUMBAR LAMINECTOMY/DECOMPRESSION MICRODISCECTOMY Left 03/11/2016   Procedure: Left Lumbar Two-ThreeMicrodiscectomy;  Surgeon: [name], MD;  Location: [place];  Service: Neurosurgery;  Laterality: Left;  . SHOULDER SURGERY Left 05/07/2009  . TONSILLECTOMY      Social History  Social History  . Marital status: Married    Spouse name: Hassan Rowan  . Number of children: 2  . Years of education: 1-College   Occupational History  . retired Retired    Retired   Social History Main Topics  . Smoking status: Never Smoker  . Smokeless tobacco: Never Used  . Alcohol use No  . Drug use: No  . Sexual activity: Not Currently   Other Topics Concern  . Not on file   Social History Narrative   Lives at home w/ his wife   Patient is right handed.   Patient has 1-2 cups of caffeine daily.    Current Outpatient Prescriptions on File Prior to Visit  Medication Sig Dispense Refill  . brimonidine-timolol (COMBIGAN) 0.2-0.5 % ophthalmic solution Place 1 drop into both eyes every 12 (twelve) hours.    Marland Kitchen ELIQUIS 5 MG TABS tablet  TAKE 1 TABLET (5 MG TOTAL) BY MOUTH 2 (TWO) TIMES DAILY. 60 tablet 3  . esomeprazole (NEXIUM) 40 MG capsule Take 40 mg by mouth daily.     . furosemide (LASIX) 20 MG tablet TAKE 1 TABLET BY MOUTH DAILY. 90 tablet 2  . imatinib (GLEEVEC) 100 MG tablet Take 2 tablets (200 mg total) by mouth 2 (two) times daily with a meal. Take with meals and large glass of water.Caution:Chemotherapy. (Patient taking differently: Take 300 mg by mouth daily. Take with meals and large glass of water.Caution:Chemotherapy.)    . isosorbide mononitrate (IMDUR) 30 MG 24 hr tablet Take 1 tablet (30 mg total) by mouth daily. 90 tablet 3  . lidocaine (LIDODERM) 5 % Place 1 patch onto the skin daily. Remove & Discard patch within 12 hours or as directed by MD (Patient taking differently: Place 1 patch onto the skin daily as needed (pain). Remove & Discard patch within 12 hours or as directed by MD) 30 patch 11  . lisinopril (PRINIVIL,ZESTRIL) 5 MG tablet Take 1 tablet (5 mg total) by mouth daily. 30 tablet 11  . loperamide (IMODIUM) 2 MG capsule Take 2 mg by mouth 3 (three) times daily as needed for diarrhea or loose stools. For diarrhea    . LORazepam (ATIVAN) 0.5 MG tablet Take 1 tablet (0.5 mg total) by mouth at bedtime. As needed for sleep (Patient taking differently: Take 0.5 mg by mouth at bedtime as needed for sleep. ) 30 tablet 2  . Melatonin 3 MG TABS Take 3 mg by mouth at bedtime as needed (for sleep).     . Multiple Vitamin (MULTIVITAMIN) tablet Take 1 tablet by mouth daily.    Marland Kitchen NITROSTAT 0.4 MG SL tablet PLACE 1 TABLET UNDER THE TONGUE EVERY 5 MINUTES X3 DOSES AS NEEDED FOR CHEST PAIN 100 tablet 1  . ondansetron (ZOFRAN ODT) 8 MG disintegrating tablet Take 1 tablet (8 mg total) by mouth every 8 (eight) hours as needed for nausea or vomiting. 90 tablet 1  . predniSONE (DELTASONE) 5 MG tablet TAKE 1 TABLET (5 MG TOTAL) BY MOUTH DAILY. 90 tablet 3  . promethazine (PHENERGAN) 12.5 MG tablet Take 1 tablet (12.5 mg total)  by mouth every 6 (six) hours as needed for nausea or vomiting. 30 tablet 0  . pyridostigmine (MESTINON) 60 MG tablet Take 1 tablet (60 mg total) by mouth 3 (three) times daily. (Patient taking differently: Take 60 mg by mouth daily. ) 90 tablet 11  . rosuvastatin (CRESTOR) 10 MG tablet TAKE 1/2 TABLETS BY MOUTH DAILY. (Patient taking differently: Take 5 mg by mouth  once a day) 45 tablet 0  . traMADol (ULTRAM) 50 MG tablet Take 1 tablet (50 mg total) by mouth every 6 (six) hours as needed. (Patient taking differently: Take 50 mg by mouth every 6 (six) hours as needed for moderate pain. ) 30 tablet 0  . [DISCONTINUED] zolpidem (AMBIEN) 10 MG tablet Take 10 mg by mouth at bedtime as needed.      No current facility-administered medications on file prior to visit.     Allergies  Allergen Reactions  . Phenergan [Promethazine Hcl] Swelling    Cannot be combined with Zofran because swelling of the eyes resulted and patient became very restless-  . Zofran [Ondansetron Hcl] Other (See Comments)    Cannot be combined with Phenergan because swelling of the eyes resulted and patient became very restless-  . Sulfamethoxazole Rash and Itching    Family History  Problem Relation Age of Onset  . Heart disease Mother   . Heart attack Mother   . Stroke Mother   . Heart disease Father   . Heart attack Father   . Stroke Father   . Breast cancer Sister        Twin   . Heart attack Sister   . Dementia Sister   . Heart disease Sister   . Heart attack Sister   . Clotting disorder Sister   . Heart attack Sister   . Colon cancer Neg Hx     BP 110/60   Pulse 72   Ht 5' 5.75" (1.67 m)   Wt 178 lb (80.7 kg)   SpO2 96%   BMI 28.95 kg/m     Review of Systems Denies headache and numbness    Objective:   Physical Exam VITAL SIGNS:  See vs page GENERAL: no distress Pulses: no carotid bruit.   Gait: slow but steady  Doppler: Essentially stable 40-59% RICA stenosis.  Lab Results  Component  Value Date   HGBA1C 5.5 07/03/2016   Lab Results  Component Value Date   CHOL 104 07/03/2016   HDL 33 (L) 07/03/2016   LDLCALC 53 07/03/2016   LDLDIRECT 59.8 10/12/2012   TRIG 91 07/03/2016   CHOLHDL 3.2 07/03/2016   Lab Results  Component Value Date   PTH 33.1 08/04/2010   CALCIUM 8.4 (L) 07/03/2016   CAION 1.14 (L) 07/01/2016   Lab Results  Component Value Date   CREATININE 1.29 (H) 07/03/2016   BUN 8 07/03/2016   NA 140 07/03/2016   K 4.1 07/03/2016   CL 108 07/03/2016   CO2 25 07/03/2016      Assessment & Plan:  TIA, better, but he needs f/u Osteoporosis: stable.  He needs reclast  Patient Instructions  Please see a vascular surgery specialist.  you will receive a phone call, about a day and time for an appointment. blood tests are requested for you today.  We'll let you know about the results. Based on the results, you may need a vitamin-D pill, or reclast, or both.

## 2016-07-09 NOTE — Patient Instructions (Addendum)
Please see a vascular surgery specialist.  you will receive a phone call, about a day and time for an appointment. blood tests are requested for you today.  We'll let you know about the results. Based on the results, you may need a vitamin-D pill, or reclast, or both.

## 2016-07-10 LAB — PTH, INTACT AND CALCIUM
Calcium: 8.9 mg/dL (ref 8.6–10.3)
PTH: 39 pg/mL (ref 14–64)

## 2016-07-11 ENCOUNTER — Telehealth: Payer: Self-pay | Admitting: Endocrinology

## 2016-07-11 NOTE — Telephone Encounter (Signed)
I need reclast form, thanks.

## 2016-07-14 ENCOUNTER — Encounter: Payer: Self-pay | Admitting: Endocrinology

## 2016-07-14 ENCOUNTER — Other Ambulatory Visit: Payer: Self-pay | Admitting: Endocrinology

## 2016-07-14 DIAGNOSIS — I639 Cerebral infarction, unspecified: Secondary | ICD-10-CM

## 2016-07-16 ENCOUNTER — Encounter: Payer: Self-pay | Admitting: Vascular Surgery

## 2016-07-20 NOTE — Telephone Encounter (Signed)
reclast form on Dr. Cordelia Pen desk awaiting his signature

## 2016-07-24 ENCOUNTER — Ambulatory Visit (INDEPENDENT_AMBULATORY_CARE_PROVIDER_SITE_OTHER): Payer: Medicare Other | Admitting: Vascular Surgery

## 2016-07-24 ENCOUNTER — Encounter: Payer: Self-pay | Admitting: Vascular Surgery

## 2016-07-24 VITALS — BP 142/82 | HR 73 | Temp 99.0°F | Resp 16 | Ht 65.75 in | Wt 180.8 lb

## 2016-07-24 DIAGNOSIS — I779 Disorder of arteries and arterioles, unspecified: Secondary | ICD-10-CM

## 2016-07-24 DIAGNOSIS — I6523 Occlusion and stenosis of bilateral carotid arteries: Secondary | ICD-10-CM

## 2016-07-24 DIAGNOSIS — I739 Peripheral vascular disease, unspecified: Principal | ICD-10-CM

## 2016-07-24 NOTE — Progress Notes (Signed)
History of Present Illness:    [name] is a 76 y.o. (1940-11-05) male who presents for evaluation of R carotid stenosis.  Symptoms related to this stenosis include recent episode of double vision three weeks ago.  The CTA of the neck showed stenosis with ulcer.  The patient denies symptoms of TIA, amaurosis, or stroke currently.  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  Prior to this he had left LE DVT with B PE's and was placed on chronic coumadin therapy.  Other medical problems include: myasthenia gravis, CML (chronic myeloid leukemia), CKD stage III, CAD, DM, HTN.    Past Medical History:  Diagnosis Date  . Bruises easily    d/t being on Eliquis  . Carotid artery disease ([place])    a. Duplex 05/2014: 88-41% RICA, 6-60% LICA, elevated velocities in right subclavian artery, normal left subclavian artery..  . Chronic back pain    HNP  . Chronic kidney disease (CKD)   . Claustrophobia    takes Ativan as needed  . 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 Coronary atherosclerosis of unspecified type of vessel, native or graft    a. cath in 2003 showed 10-20% LM, scattered 20% prox-mid LAD, 30% more distal LAD, 50-60% stenosis of LAD towards apex, 20% Cx, 30% dRCA, focal 95% stenosis in smaller of 2 branches of PDA, EF 60-65%.  . Diabetes mellitus ([place])   . Diarrhea    takes Imodium daily as needed  . Diverticulosis   . Diverticulosis of colon (without mention of hemorrhage)   . DJD (degenerative joint disease)   . DVT (deep venous thrombosis) ([place]) 07/2011  . Esophageal stricture   . Essential hypertension    takes Imdur and Lisinopril daily  . Gastric polyps    benign  . GERD (gastroesophageal reflux disease)    takes Nexium daily  . Glaucoma    uses eye drops  . History of bronchitis    not sure when the last time  . History of colon polyps    benign  . History of kidney  stones   . History of kidney stones   . Insomnia    takes Melatonin nightly as needed  . Mixed hyperlipidemia    takes Crestor daily  . Myasthenia gravis ([place])    uses prednisone  . Osteoporosis 2016  . Peripheral edema    takes Lasix daily  . Pituitary tumor    checks Dr[facility]Walden at [facility] checks it every Jan   . Pneumonia 01/2016  . PONV (postoperative nausea and vomiting)   . Ptosis   . Pulmonary embolus ([place]) 2012  . Stroke [facility]) 06/2015    Past Surgical History:  Procedure Laterality Date  . BACK SURGERY     x2, lumbar and cervical  . CARDIAC CATHETERIZATION    . CATARACT EXTRACTION Bilateral 12/12  . COLONOSCOPY    . ESOPHAGOGASTRODUODENOSCOPY ENDOSCOPY  04/2015  . IR GENERIC HISTORICAL  03/03/2016   IR IVC FILTER PLMT / S&I [name] GUID/MOD SED 03/03/2016 [name], MD [facility]INTER[facility]  . IR GENERIC HISTORICAL  04/09/2016   IR RADIOLOGIST EVAL & MGMT 04/09/2016 [name], MD [facility] INTER[facility] RAD  . IR GENERIC HISTORICAL  04/30/2016   IR IVC FILTER RETRIEVAL / S&I [name] GUID/MOD SED 04/30/2016 [name], MD [facility]INTER[facility]  . LITHOTRIPSY    . LUMBAR LAMINECTOMY/DECOMPRESSION MICRODISCECTOMY Right  09/14/2012   Procedure: Right Lumbar three-four, four-five, Lumbar five-Sacral one decompressive laminectomy;  Surgeon: [name], MD;  Location: [place] [facility]O ORS;  Service: Neurosurgery;  Laterality: Right;  . LUMBAR LAMINECTOMY/DECOMPRESSION MICRODISCECTOMY Left 03/11/2016   Procedure: Left Lumbar Two-ThreeMicrodiscectomy;  Surgeon: [name], MD;  Location: [place];  Service: Neurosurgery;  Laterality: Left;  . SHOULDER SURGERY Left 05/07/2009  . TONSILLECTOMY       Social History Social History  Substance Use Topics  . Smoking status: Never Smoker  . Smokeless tobacco: Never Used  . Alcohol use No    Family History Family History  Problem Relation Age of Onset  . Heart disease Mother   . Heart attack Mother   . Stroke Mother   . Heart disease Father   . Heart attack  Father   . Stroke Father   . Breast cancer Sister        Twin   . Heart attack Sister   . Dementia Sister   . Heart disease Sister   . Heart attack Sister   . Clotting disorder Sister   . Heart attack Sister   . Colon cancer Neg Hx     Allergies  Allergies  Allergen Reactions  . Phenergan [Promethazine Hcl] Swelling    Cannot be combined with Zofran because swelling of the eyes resulted and patient became very restless-  . Zofran [Ondansetron Hcl] Other (See Comments)    Cannot be combined with Phenergan because swelling of the eyes resulted and patient became very restless-  . Sulfamethoxazole Rash and Itching     Current Outpatient Prescriptions  Medication Sig Dispense Refill  . brimonidine-timolol (COMBIGAN) 0.2-0.5 % ophthalmic solution Place 1 drop into both eyes every 12 (twelve) hours.    Marland Kitchen ELIQUIS 5 MG TABS tablet TAKE 1 TABLET (5 MG TOTAL) BY MOUTH 2 (TWO) TIMES DAILY. 60 tablet 3  . esomeprazole (NEXIUM) 40 MG capsule Take 40 mg by mouth daily.     . furosemide (LASIX) 20 MG tablet TAKE 1 TABLET BY MOUTH DAILY. 90 tablet 2  . imatinib (GLEEVEC) 100 MG tablet Take 2 tablets (200 mg total) by mouth 2 (two) times daily with a meal. Take with meals and large glass of water.Caution:Chemotherapy. (Patient taking differently: Take 300 mg by mouth daily. Take with meals and large glass of water.Caution:Chemotherapy.)    . isosorbide mononitrate (IMDUR) 30 MG 24 hr tablet Take 1 tablet (30 mg total) by mouth daily. 90 tablet 3  . lidocaine (LIDODERM) 5 % Place 1 patch onto the skin daily. Remove & Discard patch within 12 hours or as directed by MD (Patient taking differently: Place 1 patch onto the skin daily as needed (pain). Remove & Discard patch within 12 hours or as directed by MD) 30 patch 11  . lisinopril (PRINIVIL,ZESTRIL) 5 MG tablet Take 1 tablet (5 mg total) by mouth daily. 30 tablet 11  . loperamide (IMODIUM) 2 MG capsule Take 2 mg by mouth 3 (three) times daily as  needed for diarrhea or loose stools. For diarrhea    . LORazepam (ATIVAN) 0.5 MG tablet Take 1 tablet (0.5 mg total) by mouth at bedtime. As needed for sleep (Patient taking differently: Take 0.5 mg by mouth at bedtime as needed for sleep. ) 30 tablet 2  . Melatonin 3 MG TABS Take 3 mg by mouth at bedtime as needed (for sleep).     . Multiple Vitamin (MULTIVITAMIN) tablet Take 1 tablet by mouth daily.    Marland Kitchen  NITROSTAT 0.4 MG SL tablet PLACE 1 TABLET UNDER THE TONGUE EVERY 5 MINUTES X3 DOSES AS NEEDED FOR CHEST PAIN 100 tablet 1  . ondansetron (ZOFRAN ODT) 8 MG disintegrating tablet Take 1 tablet (8 mg total) by mouth every 8 (eight) hours as needed for nausea or vomiting. 90 tablet 1  . predniSONE (DELTASONE) 5 MG tablet TAKE 1 TABLET (5 MG TOTAL) BY MOUTH DAILY. 90 tablet 3  . promethazine (PHENERGAN) 12.5 MG tablet Take 1 tablet (12.5 mg total) by mouth every 6 (six) hours as needed for nausea or vomiting. 30 tablet 0  . pyridostigmine (MESTINON) 60 MG tablet Take 1 tablet (60 mg total) by mouth 3 (three) times daily. (Patient taking differently: Take 60 mg by mouth daily. ) 90 tablet 11  . rosuvastatin (CRESTOR) 10 MG tablet TAKE 1/2 TABLETS BY MOUTH DAILY. (Patient taking differently: Take 5 mg by mouth once a day) 45 tablet 0  . traMADol (ULTRAM) 50 MG tablet Take 1 tablet (50 mg total) by mouth every 6 (six) hours as needed. (Patient taking differently: Take 50 mg by mouth every 6 (six) hours as needed for moderate pain. ) 30 tablet 0   No current facility-administered medications for this visit.     ROS:   General:  No weight loss, Fever, chills  HEENT: No recent headaches, no nasal bleeding, positive visual changes, no sore throat  Neurologic: positive dizziness, blackouts, seizures. No recent symptoms of stroke or mini- stroke. No recent episodes of slurred speech, or temporary blindness.  Cardiac: No recent episodes of chest pain/pressure, no shortness of breath at rest.  No shortness  of breath with exertion.  Denies history of atrial fibrillation or irregular heartbeat  Vascular: No history of rest pain in feet.  No history of claudication.  No history of non-healing ulcer, No history of DVT   Pulmonary: No home oxygen, no productive cough, no hemoptysis,  No asthma or wheezing  Musculoskeletal:  [ ]  Arthritis, [x ] Low back pain,  [ ]  Joint pain  Hematologic:positive history of hypercoagulable state.  No history of easy bleeding.  No history of anemia  Gastrointestinal: No hematochezia or melena,  No gastroesophageal reflux, no trouble swallowing  Urinary: [x ] chronic Kidney disease, [ ]  on HD - [ ]  MWF or [ ]  TTHS, [ ]  Burning with urination, [ ]  Frequent urination, [ ]  Difficulty urinating;   Skin: No rashes  Psychological: No history of anxiety,  No history of depression   Physical Examination  Vitals:   07/24/16 1410 07/24/16 1413  BP: (!) 144/80 (!) 142/82  Pulse: 73   Resp: 16   Temp: 99 F (37.2 C)   TempSrc: Oral   SpO2: 95%   Weight: 180 lb 12.8 oz (82 kg)   Height: 5' 5.75" (1.67 m)     Body mass index is 29.4 kg/m.  General:  Alert and oriented, no acute distress HEENT: Normal Neck: No bruit or JVD Pulmonary: Clear to auscultation bilaterally Cardiac: Regular Rate and Rhythm without murmur Gastrointestinal: Soft, non-tender, non-distended, no mass, no scars Skin: No rash Extremity Pulses:  2+ radial, brachial, femoral, dorsalis pedis, posterior tibial pulses bilaterally Musculoskeletal: No deformity or edema  Neurologic: Upper and lower extremity motor 5/5 and symmetric  DATA:  CTA 07/03/2016 Right carotid system: Right common carotid artery widely patent. Atherosclerotic plaque in the right carotid bulb which is calcified and noncalcified. There is a 5 mm ulcer in the plaque which was not present previously.  Minimal luminal diameter 4.1 mm. No significant Stenosis.  Carotid duplex 06/16/2016 Right ICA 40-59%  stenosis  ASSESSMENT:  1. 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 Recent double vision with dx of TIA 3. Myasthenia gravis   4. CML (chronic myeloid leukemia)  5. L cerebellar stroke 2017 6. Chronic anticoagulation hx of DVT, B PE's 2013 now on Eliquis fo the past year after Coumadin therapy for 3 years prior.  PLAN:   Roxy Horseman PA-C [facility]   The patient was seen in conjunction with Dr. Bridgett Larsson  Addendum  I have independently interviewed and examined the patient, and I agree with the physician assistant's findings.  Pt's has limited period of diplopia different from his usual myasthenia gravis sx.  I'm not certain that a R ICA stenosis <50% is the etiology of his diplopia.  On CTA neck, there is a suggestion of complex morphology in the R ICA plaque, so this will need to be observed with q6 month B carotid duplex.    - will discuss further with Neurology to see if they believe the R ICA stenosis is the likely source of this patient's TIA   Adele Barthel, MD, [place] [facility] [place] Office: [phone] Pager: [phone]  07/24/2016, 4:36 PM

## 2016-07-27 DIAGNOSIS — L03039 Cellulitis of unspecified toe: Secondary | ICD-10-CM | POA: Diagnosis not present

## 2016-07-31 NOTE — Addendum Note (Signed)
Addended by: Lianne Cure A on: 07/31/2016 02:56 PM   Modules accepted: Orders

## 2016-08-10 ENCOUNTER — Ambulatory Visit (INDEPENDENT_AMBULATORY_CARE_PROVIDER_SITE_OTHER): Payer: Medicare Other | Admitting: Endocrinology

## 2016-08-10 ENCOUNTER — Encounter: Payer: Self-pay | Admitting: Endocrinology

## 2016-08-10 DIAGNOSIS — L03032 Cellulitis of left toe: Secondary | ICD-10-CM | POA: Diagnosis not present

## 2016-08-10 DIAGNOSIS — I6523 Occlusion and stenosis of bilateral carotid arteries: Secondary | ICD-10-CM | POA: Diagnosis not present

## 2016-08-10 MED ORDER — DOXYCYCLINE HYCLATE 100 MG PO TABS: 100.0000 mg | ORAL_TABLET | Freq: Two times a day (BID) | ORAL | 0 refills | Status: DC

## 2016-08-10 NOTE — Patient Instructions (Signed)
I have sent a prescription to your pharmacy, for an antibiotic pill.   Please see the [place] foot doctor as scheduled in a few weeks.

## 2016-08-10 NOTE — Progress Notes (Signed)
Subjective:    Patient ID: [name], male    DOB: 1940-10-19, 76 y.o.   MRN: [id number]  HPI Pt states few weeks of moderate pain at the left great toenail, and assoc swelling. Past Medical History:  Diagnosis Date  . Bruises easily    d/t being on Eliquis  . Carotid artery disease ([place])    a. Duplex 05/2014: 95-09% RICA, 3-26% LICA, elevated velocities in right subclavian artery, normal left subclavian artery..  . Chronic back pain    HNP  . Chronic kidney disease (CKD)   . Claustrophobia    takes Ativan as needed  . 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 Coronary atherosclerosis of unspecified type of vessel, native or graft    a. cath in 2003 showed 10-20% LM, scattered 20% prox-mid LAD, 30% more distal LAD, 50-60% stenosis of LAD towards apex, 20% Cx, 30% dRCA, focal 95% stenosis in smaller of 2 branches of PDA, EF 60-65%.  . Diabetes mellitus ([place])   . Diarrhea    takes Imodium daily as needed  . Diverticulosis   . Diverticulosis of colon (without mention of hemorrhage)   . DJD (degenerative joint disease)   . DVT (deep venous thrombosis) ([place]) 07/2011  . Esophageal stricture   . Essential hypertension    takes Imdur and Lisinopril daily  . Gastric polyps    benign  . GERD (gastroesophageal reflux disease)    takes Nexium daily  . Glaucoma    uses eye drops  . History of bronchitis    not sure when the last time  . History of colon polyps    benign  . History of kidney stones   . History of kidney stones   . Insomnia    takes Melatonin nightly as needed  . Mixed hyperlipidemia    takes Crestor daily  . Myasthenia gravis ([place])    uses prednisone  . Osteoporosis 2016  . Peripheral edema    takes Lasix daily  . Pituitary tumor    checks Dr.Walden at [facility] checks it every Jan   . Pneumonia 01/2016  . PONV (postoperative nausea and vomiting)   . Ptosis   . Pulmonary embolus ([place]) 2012  . Stroke [facility]) 06/2015    Past Surgical  History:  Procedure Laterality Date  . BACK SURGERY     x2, lumbar and cervical  . CARDIAC CATHETERIZATION    . CATARACT EXTRACTION Bilateral 12/12  . COLONOSCOPY    . ESOPHAGOGASTRODUODENOSCOPY ENDOSCOPY  04/2015  . IR GENERIC HISTORICAL  03/03/2016   IR IVC FILTER PLMT / S&I [name] GUID/MOD SED 03/03/2016 [name], MD [facility]INTER[facility] RAD  . IR GENERIC HISTORICAL  04/09/2016   IR RADIOLOGIST EVAL & MGMT 04/09/2016 [name], MD [facility] INTER[facility] RAD  . IR GENERIC HISTORICAL  04/30/2016   IR IVC FILTER RETRIEVAL / S&I [name] GUID/MOD SED 04/30/2016 [name], MD [facility]-INTERV RAD  . LITHOTRIPSY    . LUMBAR LAMINECTOMY/DECOMPRESSION MICRODISCECTOMY Right 09/14/2012   Procedure: Right Lumbar three-four, four-five, Lumbar five-Sacral one decompressive laminectomy;  Surgeon: [name], MD;  Location: [place] [facility]UR[facility];  Service: Neurosurgery;  Laterality: Right;  . LUMBAR LAMINECTOMY/DECOMPRESSION MICRODISCECTOMY Left 03/11/2016   Procedure: Left Lumbar Two-ThreeMicrodiscectomy;  Surgeon: [name], MD;  Location: [place];  Service: Neurosurgery;  Laterality: Left;  . SHOULDER SURGERY Left 05/07/2009  . TONSILLECTOMY      Social History   Social History  . Marital status:  Married    Spouse name: Hassan Rowan  . Number of children: 2  . Years of education: 1-College   Occupational History  . retired Retired    Retired   Social History Main Topics  . Smoking status: Never Smoker  . Smokeless tobacco: Never Used  . Alcohol use No  . Drug use: No  . Sexual activity: Not Currently   Other Topics Concern  . Not on file   Social History Narrative   Lives at home w/ his wife   Patient is right handed.   Patient has 1-2 cups of caffeine daily.    Current Outpatient Prescriptions on File Prior to Visit  Medication Sig Dispense Refill  . brimonidine-timolol (COMBIGAN) 0.2-0.5 % ophthalmic solution Place 1 drop into both eyes every 12 (twelve) hours.    Marland Kitchen ELIQUIS 5 MG TABS tablet TAKE 1 TABLET (5 MG  TOTAL) BY MOUTH 2 (TWO) TIMES DAILY. 60 tablet 3  . esomeprazole (NEXIUM) 40 MG capsule Take 40 mg by mouth daily.     . furosemide (LASIX) 20 MG tablet TAKE 1 TABLET BY MOUTH DAILY. 90 tablet 2  . imatinib (GLEEVEC) 100 MG tablet Take 2 tablets (200 mg total) by mouth 2 (two) times daily with a meal. Take with meals and large glass of water.Caution:Chemotherapy. (Patient taking differently: Take 300 mg by mouth daily. Take with meals and large glass of water.Caution:Chemotherapy.)    . isosorbide mononitrate (IMDUR) 30 MG 24 hr tablet Take 1 tablet (30 mg total) by mouth daily. 90 tablet 3  . lidocaine (LIDODERM) 5 % Place 1 patch onto the skin daily. Remove & Discard patch within 12 hours or as directed by MD (Patient taking differently: Place 1 patch onto the skin daily as needed (pain). Remove & Discard patch within 12 hours or as directed by MD) 30 patch 11  . lisinopril (PRINIVIL,ZESTRIL) 5 MG tablet Take 1 tablet (5 mg total) by mouth daily. 30 tablet 11  . loperamide (IMODIUM) 2 MG capsule Take 2 mg by mouth 3 (three) times daily as needed for diarrhea or loose stools. For diarrhea    . LORazepam (ATIVAN) 0.5 MG tablet Take 1 tablet (0.5 mg total) by mouth at bedtime. As needed for sleep (Patient taking differently: Take 0.5 mg by mouth at bedtime as needed for sleep. ) 30 tablet 2  . Melatonin 3 MG TABS Take 3 mg by mouth at bedtime as needed (for sleep).     . Multiple Vitamin (MULTIVITAMIN) tablet Take 1 tablet by mouth daily.    Marland Kitchen NITROSTAT 0.4 MG SL tablet PLACE 1 TABLET UNDER THE TONGUE EVERY 5 MINUTES X3 DOSES AS NEEDED FOR CHEST PAIN 100 tablet 1  . ondansetron (ZOFRAN ODT) 8 MG disintegrating tablet Take 1 tablet (8 mg total) by mouth every 8 (eight) hours as needed for nausea or vomiting. 90 tablet 1  . predniSONE (DELTASONE) 5 MG tablet TAKE 1 TABLET (5 MG TOTAL) BY MOUTH DAILY. 90 tablet 3  . promethazine (PHENERGAN) 12.5 MG tablet Take 1 tablet (12.5 mg total) by mouth every 6  (six) hours as needed for nausea or vomiting. 30 tablet 0  . pyridostigmine (MESTINON) 60 MG tablet Take 1 tablet (60 mg total) by mouth 3 (three) times daily. (Patient taking differently: Take 60 mg by mouth daily. ) 90 tablet 11  . rosuvastatin (CRESTOR) 10 MG tablet TAKE 1/2 TABLETS BY MOUTH DAILY. (Patient taking differently: Take 5 mg by mouth once a day) 45 tablet 0  .  traMADol (ULTRAM) 50 MG tablet Take 1 tablet (50 mg total) by mouth every 6 (six) hours as needed. (Patient taking differently: Take 50 mg by mouth every 6 (six) hours as needed for moderate pain. ) 30 tablet 0  . [DISCONTINUED] zolpidem (AMBIEN) 10 MG tablet Take 10 mg by mouth at bedtime as needed.      No current facility-administered medications on file prior to visit.     Allergies  Allergen Reactions  . Phenergan [Promethazine Hcl] Swelling    Cannot be combined with Zofran because swelling of the eyes resulted and patient became very restless-  . Zofran [Ondansetron Hcl] Other (See Comments)    Cannot be combined with Phenergan because swelling of the eyes resulted and patient became very restless-  . Sulfamethoxazole Rash and Itching    Family History  Problem Relation Age of Onset  . Heart disease Mother   . Heart attack Mother   . Stroke Mother   . Heart disease Father   . Heart attack Father   . Stroke Father   . Breast cancer Sister        Twin   . Heart attack Sister   . Dementia Sister   . Heart disease Sister   . Heart attack Sister   . Clotting disorder Sister   . Heart attack Sister   . Colon cancer Neg Hx     BP 122/64   Pulse 79   Temp 98.4 F (36.9 C) (Oral)   Ht 5' 5.75" (1.67 m)   Wt 181 lb (82.1 kg)   SpO2 97%   BMI 29.44 kg/m    Review of Systems No fever    Objective:   Physical Exam VITAL SIGNS:  See vs page GENERAL: no distress Left great toenail: slight erythema/swell/tend at the paronychial area.       Assessment & Plan:  Paronychial infection, new.     Patient Instructions  I have sent a prescription to your pharmacy, for an antibiotic pill.   Please see the [facility] doctor as scheduled in a few weeks.

## 2016-08-14 DIAGNOSIS — M6281 Muscle weakness (generalized): Secondary | ICD-10-CM | POA: Diagnosis not present

## 2016-08-14 DIAGNOSIS — M25612 Stiffness of left shoulder, not elsewhere classified: Secondary | ICD-10-CM | POA: Diagnosis not present

## 2016-08-14 DIAGNOSIS — Z8673 Personal history of transient ischemic attack (TIA), and cerebral infarction without residual deficits: Secondary | ICD-10-CM | POA: Diagnosis not present

## 2016-08-14 DIAGNOSIS — M25611 Stiffness of right shoulder, not elsewhere classified: Secondary | ICD-10-CM | POA: Diagnosis not present

## 2016-08-14 DIAGNOSIS — Z9889 Other specified postprocedural states: Secondary | ICD-10-CM | POA: Diagnosis not present

## 2016-08-14 DIAGNOSIS — R5383 Other fatigue: Secondary | ICD-10-CM | POA: Diagnosis not present

## 2016-08-14 DIAGNOSIS — M5416 Radiculopathy, lumbar region: Secondary | ICD-10-CM | POA: Diagnosis not present

## 2016-08-14 DIAGNOSIS — R2689 Other abnormalities of gait and mobility: Secondary | ICD-10-CM | POA: Diagnosis not present

## 2016-08-14 DIAGNOSIS — R2681 Unsteadiness on feet: Secondary | ICD-10-CM | POA: Diagnosis not present

## 2016-08-14 DIAGNOSIS — M2560 Stiffness of unspecified joint, not elsewhere classified: Secondary | ICD-10-CM | POA: Diagnosis not present

## 2016-08-14 DIAGNOSIS — M545 Low back pain: Secondary | ICD-10-CM | POA: Diagnosis not present

## 2016-08-14 DIAGNOSIS — R293 Abnormal posture: Secondary | ICD-10-CM | POA: Diagnosis not present

## 2016-08-17 ENCOUNTER — Ambulatory Visit (INDEPENDENT_AMBULATORY_CARE_PROVIDER_SITE_OTHER): Payer: Medicare Other | Admitting: Podiatry

## 2016-08-17 DIAGNOSIS — L600 Ingrowing nail: Secondary | ICD-10-CM | POA: Diagnosis not present

## 2016-08-17 MED ORDER — AMOXICILLIN 500 MG PO CAPS: 500.0000 mg | ORAL_CAPSULE | Freq: Three times a day (TID) | ORAL | 0 refills | Status: DC

## 2016-08-17 NOTE — Patient Instructions (Signed)

## 2016-08-18 DIAGNOSIS — M6281 Muscle weakness (generalized): Secondary | ICD-10-CM | POA: Diagnosis not present

## 2016-08-18 DIAGNOSIS — R293 Abnormal posture: Secondary | ICD-10-CM | POA: Diagnosis not present

## 2016-08-18 DIAGNOSIS — R5383 Other fatigue: Secondary | ICD-10-CM | POA: Diagnosis not present

## 2016-08-18 DIAGNOSIS — Z9889 Other specified postprocedural states: Secondary | ICD-10-CM | POA: Diagnosis not present

## 2016-08-18 DIAGNOSIS — M545 Low back pain: Secondary | ICD-10-CM | POA: Diagnosis not present

## 2016-08-18 DIAGNOSIS — M5416 Radiculopathy, lumbar region: Secondary | ICD-10-CM | POA: Diagnosis not present

## 2016-08-20 DIAGNOSIS — M6281 Muscle weakness (generalized): Secondary | ICD-10-CM | POA: Diagnosis not present

## 2016-08-20 DIAGNOSIS — R5383 Other fatigue: Secondary | ICD-10-CM | POA: Diagnosis not present

## 2016-08-20 DIAGNOSIS — M5416 Radiculopathy, lumbar region: Secondary | ICD-10-CM | POA: Diagnosis not present

## 2016-08-20 DIAGNOSIS — R293 Abnormal posture: Secondary | ICD-10-CM | POA: Diagnosis not present

## 2016-08-20 DIAGNOSIS — Z9889 Other specified postprocedural states: Secondary | ICD-10-CM | POA: Diagnosis not present

## 2016-08-20 DIAGNOSIS — M545 Low back pain: Secondary | ICD-10-CM | POA: Diagnosis not present

## 2016-08-21 ENCOUNTER — Ambulatory Visit (INDEPENDENT_AMBULATORY_CARE_PROVIDER_SITE_OTHER): Payer: Medicare Other | Admitting: Podiatry

## 2016-08-21 DIAGNOSIS — L600 Ingrowing nail: Secondary | ICD-10-CM

## 2016-08-21 MED ORDER — GENTAMICIN SULFATE 0.1 % EX CREA: 1.0000 "application " | TOPICAL_CREAM | Freq: Three times a day (TID) | CUTANEOUS | 1 refills | Status: DC

## 2016-08-21 NOTE — Progress Notes (Signed)
Subjective: 76 year old male with a history of diabetes mellitus presents today for follow-up evaluation of an ingrown toenail that was removed on last visit. Patient is concerned today because he is diabetic and he is noticed that his left great toe where the partial permanent nail avulsion was performed is now sore and very red. He says that there is some drainage. There is no malodor. It is very tender to touch. Patient presents today for further treatment and evaluation  Objective: There is some localized cellulitis and erythema surrounding the nail fold and nail base where the partial nail avulsion was performed. This is likely a reaction to the phenol application rather than infectious in nature.  Assessment: Status post partial nail avulsion left great toe medial border performed on 08/17/2016 Erythema and edema secondary to phenol application left great toe  Plan of care: #1 the patient was evaluated #2 continue taking amoxicillin 500 mg 3 times a day #3 prescription for gentamicin cream was provided for the patient #4 return to clinic on next scheduled appointment  Edrick Kins, DPM [facility]  Dr. Edrick Kins, DPM    [address], [place] [address]                Office [phone]  Fax [phone]

## 2016-08-24 DIAGNOSIS — M6281 Muscle weakness (generalized): Secondary | ICD-10-CM | POA: Diagnosis not present

## 2016-08-24 DIAGNOSIS — M545 Low back pain: Secondary | ICD-10-CM | POA: Diagnosis not present

## 2016-08-24 DIAGNOSIS — R293 Abnormal posture: Secondary | ICD-10-CM | POA: Diagnosis not present

## 2016-08-24 DIAGNOSIS — R5383 Other fatigue: Secondary | ICD-10-CM | POA: Diagnosis not present

## 2016-08-24 DIAGNOSIS — M5416 Radiculopathy, lumbar region: Secondary | ICD-10-CM | POA: Diagnosis not present

## 2016-08-24 DIAGNOSIS — Z9889 Other specified postprocedural states: Secondary | ICD-10-CM | POA: Diagnosis not present

## 2016-08-24 NOTE — Anesthesia Postprocedure Evaluation (Deleted)
Anesthesia Post Note  Patient: [name]  Procedure(s) Performed: Procedure(s) (LRB): Left Lumbar Two-ThreeMicrodiscectomy (Left)     Anesthesia Post Evaluation  Last Vitals:  Vitals:   03/12/16 0345 03/12/16 0748  BP: 135/81 (!) 156/86  Pulse: 72 75  Resp: 18 18  Temp: 36.6 C 36.9 C    Last Pain:  Vitals:   03/12/16 1108  TempSrc:   PainSc: 3                  Zillah Alexie,E. Makayla Confer

## 2016-08-24 NOTE — Addendum Note (Signed)
Addendum  created 08/24/16 1522 by Annye Asa, MD   Sign clinical note

## 2016-08-24 NOTE — Addendum Note (Signed)
Addendum  created 08/24/16 1650 by Annye Asa, MD   Delete clinical note, Sign clinical note

## 2016-08-25 DIAGNOSIS — R351 Nocturia: Secondary | ICD-10-CM | POA: Diagnosis not present

## 2016-08-27 ENCOUNTER — Encounter: Payer: Self-pay | Admitting: Endocrinology

## 2016-08-27 DIAGNOSIS — R293 Abnormal posture: Secondary | ICD-10-CM | POA: Diagnosis not present

## 2016-08-27 DIAGNOSIS — M5416 Radiculopathy, lumbar region: Secondary | ICD-10-CM | POA: Diagnosis not present

## 2016-08-27 DIAGNOSIS — M6281 Muscle weakness (generalized): Secondary | ICD-10-CM | POA: Diagnosis not present

## 2016-08-27 DIAGNOSIS — M545 Low back pain: Secondary | ICD-10-CM | POA: Diagnosis not present

## 2016-08-27 DIAGNOSIS — Z9889 Other specified postprocedural states: Secondary | ICD-10-CM | POA: Diagnosis not present

## 2016-08-27 DIAGNOSIS — R5383 Other fatigue: Secondary | ICD-10-CM | POA: Diagnosis not present

## 2016-08-28 ENCOUNTER — Other Ambulatory Visit: Payer: Self-pay | Admitting: Endocrinology

## 2016-08-28 DIAGNOSIS — M810 Age-related osteoporosis without current pathological fracture: Secondary | ICD-10-CM | POA: Insufficient documentation

## 2016-08-31 ENCOUNTER — Ambulatory Visit: Payer: Medicare Other | Admitting: Podiatry

## 2016-08-31 DIAGNOSIS — H401131 Primary open-angle glaucoma, bilateral, mild stage: Secondary | ICD-10-CM | POA: Diagnosis not present

## 2016-09-02 ENCOUNTER — Encounter: Payer: Self-pay | Admitting: Podiatry

## 2016-09-02 ENCOUNTER — Ambulatory Visit (INDEPENDENT_AMBULATORY_CARE_PROVIDER_SITE_OTHER): Payer: Medicare Other | Admitting: Podiatry

## 2016-09-02 DIAGNOSIS — M79676 Pain in unspecified toe(s): Secondary | ICD-10-CM

## 2016-09-02 DIAGNOSIS — S91209D Unspecified open wound of unspecified toe(s) with damage to nail, subsequent encounter: Secondary | ICD-10-CM

## 2016-09-02 DIAGNOSIS — S91109D Unspecified open wound of unspecified toe(s) without damage to nail, subsequent encounter: Secondary | ICD-10-CM

## 2016-09-04 NOTE — Progress Notes (Signed)
   Subjective: Patient presents today for evaluation of pain in toe(s). Patient is concerned for possible ingrown nail. Patient states that the pain has been present for a few weeks now. Patient presents today for further treatment and evaluation.  Objective:  General: Well developed, nourished, in no acute distress, alert and oriented x3   Dermatology: Skin is warm, dry and supple bilateral. Medial border of the left great toe appears to be erythematous with evidence of an ingrowing nail. Pain on palpation noted to the border of the nail fold. The remaining nails appear unremarkable at this time. There are no open sores, lesions.  Vascular: Dorsalis Pedis artery and Posterior Tibial artery pedal pulses palpable. No lower extremity edema noted.   Neruologic: Grossly intact via light touch bilateral.  Musculoskeletal: Muscular strength within normal limits in all groups bilateral. Normal range of motion noted to all pedal and ankle joints.   Assesement: #1 Paronychia with ingrowing nail medial border of the left great toe #2 Pain in toe #3 Incurvated nail  Plan of Care:  1. Patient evaluated.  2. Discussed treatment alternatives and plan of care. Explained nail avulsion procedure and post procedure course to patient. 3. Patient opted for permanent partial nail avulsion.  4. Prior to procedure, local anesthesia infiltration utilized using 3 ml of a 50:50 mixture of 2% plain lidocaine and 0.5% plain marcaine in a normal hallux block fashion and a betadine prep performed.  5. Partial permanent nail avulsion with chemical matrixectomy performed using [id number] applications of phenol followed by alcohol flush.  6. Light dressing applied. 7. Return to clinic in 2 weeks.   Edrick Kins, DPM [facility]  Dr. Edrick Kins, [address]                                        [place], [place] [address]                Office [phone]  Fax [phone]

## 2016-09-05 NOTE — Progress Notes (Signed)
   Subjective: Patient presents today 2 weeks post ingrown nail permanent nail avulsion procedure. Patient states that the toe and nail fold is feeling much better.  Objective: Skin is warm, dry and supple. Nail and respective nail fold appears to be healing appropriately. Open wound to the associated nail fold with a granular wound base and moderate amount of fibrotic tissue. Minimal drainage noted. Mild erythema around the periungual region likely due to phenol chemical matricectomy.  Assessment: #1 postop permanent partial nail avulsion medial border of the left great toe #2 open wound periungual nail fold of respective digit.   Plan of care: #1 patient was evaluated  #2 debridement of open wound was performed to the periungual border of the respective toe using a currette. Antibiotic ointment and Band-Aid was applied. #3 patient is to return to clinic on a PRN  basis.   Edrick Kins, DPM [facility]  Dr. Edrick Kins, [address]                                        [place], [place] [address]                Office [phone]  Fax [phone]

## 2016-09-08 DIAGNOSIS — R5383 Other fatigue: Secondary | ICD-10-CM | POA: Diagnosis not present

## 2016-09-08 DIAGNOSIS — M5416 Radiculopathy, lumbar region: Secondary | ICD-10-CM | POA: Diagnosis not present

## 2016-09-08 DIAGNOSIS — M6281 Muscle weakness (generalized): Secondary | ICD-10-CM | POA: Diagnosis not present

## 2016-09-08 DIAGNOSIS — Z9889 Other specified postprocedural states: Secondary | ICD-10-CM | POA: Diagnosis not present

## 2016-09-08 DIAGNOSIS — R293 Abnormal posture: Secondary | ICD-10-CM | POA: Diagnosis not present

## 2016-09-08 DIAGNOSIS — M545 Low back pain: Secondary | ICD-10-CM | POA: Diagnosis not present

## 2016-09-10 DIAGNOSIS — M545 Low back pain: Secondary | ICD-10-CM | POA: Diagnosis not present

## 2016-09-10 DIAGNOSIS — R293 Abnormal posture: Secondary | ICD-10-CM | POA: Diagnosis not present

## 2016-09-10 DIAGNOSIS — M25612 Stiffness of left shoulder, not elsewhere classified: Secondary | ICD-10-CM | POA: Diagnosis not present

## 2016-09-10 DIAGNOSIS — Z8673 Personal history of transient ischemic attack (TIA), and cerebral infarction without residual deficits: Secondary | ICD-10-CM | POA: Diagnosis not present

## 2016-09-10 DIAGNOSIS — M2560 Stiffness of unspecified joint, not elsewhere classified: Secondary | ICD-10-CM | POA: Diagnosis not present

## 2016-09-10 DIAGNOSIS — R2689 Other abnormalities of gait and mobility: Secondary | ICD-10-CM | POA: Diagnosis not present

## 2016-09-10 DIAGNOSIS — M25611 Stiffness of right shoulder, not elsewhere classified: Secondary | ICD-10-CM | POA: Diagnosis not present

## 2016-09-10 DIAGNOSIS — Z9889 Other specified postprocedural states: Secondary | ICD-10-CM | POA: Diagnosis not present

## 2016-09-10 DIAGNOSIS — M6281 Muscle weakness (generalized): Secondary | ICD-10-CM | POA: Diagnosis not present

## 2016-09-10 DIAGNOSIS — R2681 Unsteadiness on feet: Secondary | ICD-10-CM | POA: Diagnosis not present

## 2016-09-10 DIAGNOSIS — M5416 Radiculopathy, lumbar region: Secondary | ICD-10-CM | POA: Diagnosis not present

## 2016-09-10 DIAGNOSIS — R5383 Other fatigue: Secondary | ICD-10-CM | POA: Diagnosis not present

## 2016-09-14 ENCOUNTER — Encounter: Payer: Self-pay | Admitting: Endocrinology

## 2016-09-15 DIAGNOSIS — M5416 Radiculopathy, lumbar region: Secondary | ICD-10-CM | POA: Diagnosis not present

## 2016-09-15 DIAGNOSIS — R5383 Other fatigue: Secondary | ICD-10-CM | POA: Diagnosis not present

## 2016-09-15 DIAGNOSIS — R293 Abnormal posture: Secondary | ICD-10-CM | POA: Diagnosis not present

## 2016-09-15 DIAGNOSIS — M545 Low back pain: Secondary | ICD-10-CM | POA: Diagnosis not present

## 2016-09-15 DIAGNOSIS — Z9889 Other specified postprocedural states: Secondary | ICD-10-CM | POA: Diagnosis not present

## 2016-09-15 DIAGNOSIS — M6281 Muscle weakness (generalized): Secondary | ICD-10-CM | POA: Diagnosis not present

## 2016-09-17 DIAGNOSIS — R5383 Other fatigue: Secondary | ICD-10-CM | POA: Diagnosis not present

## 2016-09-17 DIAGNOSIS — Z9889 Other specified postprocedural states: Secondary | ICD-10-CM | POA: Diagnosis not present

## 2016-09-17 DIAGNOSIS — M545 Low back pain: Secondary | ICD-10-CM | POA: Diagnosis not present

## 2016-09-17 DIAGNOSIS — R293 Abnormal posture: Secondary | ICD-10-CM | POA: Diagnosis not present

## 2016-09-17 DIAGNOSIS — M6281 Muscle weakness (generalized): Secondary | ICD-10-CM | POA: Diagnosis not present

## 2016-09-17 DIAGNOSIS — M5416 Radiculopathy, lumbar region: Secondary | ICD-10-CM | POA: Diagnosis not present

## 2016-09-20 ENCOUNTER — Other Ambulatory Visit: Payer: Self-pay | Admitting: Neurology

## 2016-09-22 DIAGNOSIS — M545 Low back pain: Secondary | ICD-10-CM | POA: Diagnosis not present

## 2016-09-22 DIAGNOSIS — M5416 Radiculopathy, lumbar region: Secondary | ICD-10-CM | POA: Diagnosis not present

## 2016-09-22 DIAGNOSIS — R5383 Other fatigue: Secondary | ICD-10-CM | POA: Diagnosis not present

## 2016-09-22 DIAGNOSIS — M6281 Muscle weakness (generalized): Secondary | ICD-10-CM | POA: Diagnosis not present

## 2016-09-22 DIAGNOSIS — R293 Abnormal posture: Secondary | ICD-10-CM | POA: Diagnosis not present

## 2016-09-22 DIAGNOSIS — Z9889 Other specified postprocedural states: Secondary | ICD-10-CM | POA: Diagnosis not present

## 2016-09-29 DIAGNOSIS — R5383 Other fatigue: Secondary | ICD-10-CM | POA: Diagnosis not present

## 2016-09-29 DIAGNOSIS — M6281 Muscle weakness (generalized): Secondary | ICD-10-CM | POA: Diagnosis not present

## 2016-09-29 DIAGNOSIS — R293 Abnormal posture: Secondary | ICD-10-CM | POA: Diagnosis not present

## 2016-09-29 DIAGNOSIS — M545 Low back pain: Secondary | ICD-10-CM | POA: Diagnosis not present

## 2016-09-29 DIAGNOSIS — M5416 Radiculopathy, lumbar region: Secondary | ICD-10-CM | POA: Diagnosis not present

## 2016-09-29 DIAGNOSIS — Z9889 Other specified postprocedural states: Secondary | ICD-10-CM | POA: Diagnosis not present

## 2016-09-30 ENCOUNTER — Other Ambulatory Visit: Payer: Self-pay | Admitting: Endocrinology

## 2016-10-01 DIAGNOSIS — M545 Low back pain: Secondary | ICD-10-CM | POA: Diagnosis not present

## 2016-10-01 DIAGNOSIS — Z9889 Other specified postprocedural states: Secondary | ICD-10-CM | POA: Diagnosis not present

## 2016-10-01 DIAGNOSIS — R5383 Other fatigue: Secondary | ICD-10-CM | POA: Diagnosis not present

## 2016-10-01 DIAGNOSIS — M6281 Muscle weakness (generalized): Secondary | ICD-10-CM | POA: Diagnosis not present

## 2016-10-01 DIAGNOSIS — M5416 Radiculopathy, lumbar region: Secondary | ICD-10-CM | POA: Diagnosis not present

## 2016-10-01 DIAGNOSIS — R293 Abnormal posture: Secondary | ICD-10-CM | POA: Diagnosis not present

## 2016-10-02 ENCOUNTER — Other Ambulatory Visit: Payer: Self-pay

## 2016-10-02 MED ORDER — TRAMADOL HCL 50 MG PO TABS: 50.0000 mg | ORAL_TABLET | Freq: Four times a day (QID) | ORAL | 0 refills | Status: DC | PRN

## 2016-10-05 DIAGNOSIS — Z9889 Other specified postprocedural states: Secondary | ICD-10-CM | POA: Diagnosis not present

## 2016-10-05 DIAGNOSIS — M6281 Muscle weakness (generalized): Secondary | ICD-10-CM | POA: Diagnosis not present

## 2016-10-05 DIAGNOSIS — M545 Low back pain: Secondary | ICD-10-CM | POA: Diagnosis not present

## 2016-10-05 DIAGNOSIS — M5416 Radiculopathy, lumbar region: Secondary | ICD-10-CM | POA: Diagnosis not present

## 2016-10-05 DIAGNOSIS — R293 Abnormal posture: Secondary | ICD-10-CM | POA: Diagnosis not present

## 2016-10-05 DIAGNOSIS — R5383 Other fatigue: Secondary | ICD-10-CM | POA: Diagnosis not present

## 2016-10-06 DIAGNOSIS — Z125 Encounter for screening for malignant neoplasm of prostate: Secondary | ICD-10-CM | POA: Diagnosis not present

## 2016-10-06 DIAGNOSIS — R351 Nocturia: Secondary | ICD-10-CM | POA: Diagnosis not present

## 2016-10-07 DIAGNOSIS — R5383 Other fatigue: Secondary | ICD-10-CM | POA: Diagnosis not present

## 2016-10-07 DIAGNOSIS — R293 Abnormal posture: Secondary | ICD-10-CM | POA: Diagnosis not present

## 2016-10-07 DIAGNOSIS — M5416 Radiculopathy, lumbar region: Secondary | ICD-10-CM | POA: Diagnosis not present

## 2016-10-07 DIAGNOSIS — M6281 Muscle weakness (generalized): Secondary | ICD-10-CM | POA: Diagnosis not present

## 2016-10-07 DIAGNOSIS — Z9889 Other specified postprocedural states: Secondary | ICD-10-CM | POA: Diagnosis not present

## 2016-10-07 DIAGNOSIS — M545 Low back pain: Secondary | ICD-10-CM | POA: Diagnosis not present

## 2016-10-09 ENCOUNTER — Ambulatory Visit (INDEPENDENT_AMBULATORY_CARE_PROVIDER_SITE_OTHER): Payer: Medicare Other | Admitting: Cardiovascular Disease

## 2016-10-09 ENCOUNTER — Encounter: Payer: Self-pay | Admitting: Cardiovascular Disease

## 2016-10-09 VITALS — BP 122/56 | HR 56 | Ht 67.0 in | Wt 182.8 lb

## 2016-10-09 DIAGNOSIS — E782 Mixed hyperlipidemia: Secondary | ICD-10-CM | POA: Diagnosis not present

## 2016-10-09 DIAGNOSIS — I739 Peripheral vascular disease, unspecified: Secondary | ICD-10-CM

## 2016-10-09 DIAGNOSIS — I6523 Occlusion and stenosis of bilateral carotid arteries: Secondary | ICD-10-CM

## 2016-10-09 DIAGNOSIS — I5032 Chronic diastolic (congestive) heart failure: Secondary | ICD-10-CM

## 2016-10-09 DIAGNOSIS — I2510 Atherosclerotic heart disease of native coronary artery without angina pectoris: Secondary | ICD-10-CM

## 2016-10-09 DIAGNOSIS — I10 Essential (primary) hypertension: Secondary | ICD-10-CM | POA: Diagnosis not present

## 2016-10-09 NOTE — Progress Notes (Signed)
1. He is feeling okay T is really    Cardiology Office Note Date:  10/09/2016   ID:  DALLIN MCCORKEL, DOB November 18, 1940, MRN [id number]  PCP:  Renato Shin, MD  Cardiologist:  Sherren Mocha, MD    Chief Complaint  Patient presents with  . Essential Hypertension     History of Present Illness: [name] is a 76 y.o. male who presents for follow-up evaluation. Patient has a history of coronary artery disease with distal vessel disease pattern by cardiac catheterization in 2003. Medical therapy was recommended at that time. 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 Since that time he's had another TIA. He's been followed by vascular surgery for nonobstructive carotid stenosis felt to have a complex plaque but not felt to be the likely culprit for the stroke. Plans noted for follow-up carotid imaging in 6 months.   He is here alone today. Complaining of bilateral leg pain. States he has bilateral thigh pain and calf pain at rest. His calf pain is much worse with walking. Both legs are equally affected. He denies foot pain or ulceration/nonhealing wounds. He denies chest pain or pressure. No shortness of breath, orthopnea, PND, or heart palpitations.  Past Medical History:  Diagnosis Date  . Bruises easily    d/t being on Eliquis  . Carotid artery disease ([place])    a. Duplex 05/2014: 14-43% RICA, 1-54% LICA, elevated velocities in right subclavian artery, normal left subclavian artery..  . Chronic back pain    HNP  . Chronic kidney disease (CKD)   . Claustrophobia    takes Ativan as needed  . 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 Coronary atherosclerosis of unspecified type of vessel, native or graft    a. cath in 2003 showed 10-20% LM, scattered 20% prox-mid LAD, 30% more distal LAD, 50-60% stenosis of LAD towards apex, 20% Cx, 30% dRCA, focal 95% stenosis in smaller of 2  branches of PDA, EF 60-65%.  . Diabetes mellitus ([place])   . Diarrhea    takes Imodium daily as needed  . Diverticulosis   . Diverticulosis of colon (without mention of hemorrhage)   . DJD (degenerative joint disease)   . DVT (deep venous thrombosis) ([place]) 07/2011  . Esophageal stricture   . Essential hypertension    takes Imdur and Lisinopril daily  . Gastric polyps    benign  . GERD (gastroesophageal reflux disease)    takes Nexium daily  . Glaucoma    uses eye drops  . History of bronchitis    not sure when the last time  . History of colon polyps    benign  . History of kidney stones   . History of kidney stones   . Insomnia    takes Melatonin nightly as needed  . Mixed hyperlipidemia    takes Crestor daily  . Myasthenia gravis ([place])    uses prednisone  . Osteoporosis 2016  . Peripheral edema    takes Lasix daily  . Pituitary tumor    checks Dr[facility]Walden at [facility] checks it every Jan   . Pneumonia 01/2016  . PONV (postoperative nausea and vomiting)   . Ptosis   . Pulmonary embolus ([place]) 2012  . Stroke [facility]) 06/2015    Past Surgical History:  Procedure Laterality Date  . BACK SURGERY     x2, lumbar and cervical  . CARDIAC CATHETERIZATION    .  CATARACT EXTRACTION Bilateral 12/12  . COLONOSCOPY    . ESOPHAGOGASTRODUODENOSCOPY ENDOSCOPY  04/2015  . IR GENERIC HISTORICAL  03/03/2016   IR IVC FILTER PLMT / S&I [name] GUID/MOD SED 03/03/2016 [name], MD [facility]INTER[facility]  . IR GENERIC HISTORICAL  04/09/2016   IR RADIOLOGIST EVAL & MGMT 04/09/2016 [name], MD [facility] INTER[facility] RAD  . IR GENERIC HISTORICAL  04/30/2016   IR IVC FILTER RETRIEVAL / S&I [name] GUID/MOD SED 04/30/2016 [name], MD [facility]  . LITHOTRIPSY    . LUMBAR LAMINECTOMY/DECOMPRESSION MICRODISCECTOMY Right 09/14/2012   Procedure: Right Lumbar three-four, four-five, Lumbar five-Sacral one decompressive laminectomy;  Surgeon: [name], MD;  Location: [place] NEURO ORS;  Service: Neurosurgery;   Laterality: Right;  . LUMBAR LAMINECTOMY/DECOMPRESSION MICRODISCECTOMY Left 03/11/2016   Procedure: Left Lumbar Two-ThreeMicrodiscectomy;  Surgeon: [name], MD;  Location: [facility];  Service: Neurosurgery;  Laterality: Left;  . SHOULDER SURGERY Left 05/07/2009  . TONSILLECTOMY      Current Outpatient Prescriptions  Medication Sig Dispense Refill  . brimonidine-timolol (COMBIGAN) 0.2-0.5 % ophthalmic solution Place 1 drop into both eyes every 12 (twelve) hours.    . cholecalciferol (VITAMIN D) 1000 units tablet Take 2,000 Units by mouth daily.    Marland Kitchen ELIQUIS 5 MG TABS tablet TAKE 1 TABLET (5 MG TOTAL) BY MOUTH 2 (TWO) TIMES DAILY. 60 tablet 3  . esomeprazole (NEXIUM) 40 MG capsule Take 40 mg by mouth daily.     . furosemide (LASIX) 20 MG tablet TAKE 1 TABLET BY MOUTH DAILY. 90 tablet 2  . imatinib (GLEEVEC) 100 MG tablet Take 300 mg by mouth daily. Take with meals and large glass of water.Caution:Chemotherapy    . isosorbide mononitrate (IMDUR) 30 MG 24 hr tablet Take 1 tablet (30 mg total) by mouth daily. 90 tablet 3  . lidocaine (LIDODERM) 5 % Place 1 patch onto the skin daily as needed (pain). Remove & Discard patch within 12 hours or as directed by MD    . lisinopril (PRINIVIL,ZESTRIL) 5 MG tablet Take 1 tablet (5 mg total) by mouth daily. 30 tablet 11  . loperamide (IMODIUM) 2 MG capsule Take 2 mg by mouth 3 (three) times daily as needed for diarrhea or loose stools. For diarrhea    . LORazepam (ATIVAN) 0.5 MG tablet Take 0.5 mg by mouth at bedtime as needed for sleep.    . Melatonin 3 MG TABS Take 3 mg by mouth at bedtime as needed (for sleep).     . Multiple Vitamin (MULTIVITAMIN) tablet Take 1 tablet by mouth daily.    Marland Kitchen NITROSTAT 0.4 MG SL tablet PLACE 1 TABLET UNDER THE TONGUE EVERY 5 MINUTES X3 DOSES AS NEEDED FOR CHEST PAIN 100 tablet 1  . ondansetron (ZOFRAN ODT) 8 MG disintegrating tablet Take 1 tablet (8 mg total) by mouth every 8 (eight) hours as needed for nausea or vomiting.  90 tablet 1  . predniSONE (DELTASONE) 5 MG tablet TAKE 1 TABLET BY MOUTH DAILY. 90 tablet 1  . promethazine (PHENERGAN) 12.5 MG tablet Take 1 tablet (12.5 mg total) by mouth every 6 (six) hours as needed for nausea or vomiting. 30 tablet 0  . pyridostigmine (MESTINON) 60 MG tablet Take 60 mg by mouth daily.    . rosuvastatin (CRESTOR) 10 MG tablet Take 5 mg by mouth daily.    . traMADol (ULTRAM) 50 MG tablet Take 50 mg by mouth every 6 (six) hours as needed (pain).     No current facility-administered medications  for this visit.     Allergies:   Phenergan [promethazine hcl]; Zofran [ondansetron hcl]; and Sulfamethoxazole   Social History:  The patient  reports that he has never smoked. He has never used smokeless tobacco. He reports that he does not drink alcohol or use drugs.   Family History:  The patient's  family history includes Breast cancer in his sister; Clotting disorder in his sister; Dementia in his sister; Heart attack in his father, mother, sister, sister, and sister; Heart disease in his father, mother, and sister; Stroke in his father and mother.    ROS:  Please see the history of present illness.  Otherwise, review of systems is positive for Leg swelling, hearing loss, diarrhea, back pain, muscle pain, easy bruising, leg pain, nausea.  All other systems are reviewed and negative.    PHYSICAL EXAM: VS:  BP (!) 122/56   Pulse (!) 56   Ht 5\' 7"  (1.702 m)   Wt 182 lb 12.8 oz (82.9 kg)   BMI 28.63 kg/m  , BMI Body mass index is 28.63 kg/m. GEN: Well nourished, well developed, in no acute distress  HEENT: normal  Neck: no JVD, no masses. Right sided carotid bruit present Cardiac: RRR without murmur or gallop                Respiratory:  clear to auscultation bilaterally, normal work of breathing GI: soft, nontender, nondistended, + BS MS: no deformity or atrophy  Ext: 1+ foot and ankle edema bilaterally  pedal pulses diminished bilaterally Skin: warm and dry, no  rash Neuro:  Strength and sensation are intact. Periodic episodes of expressive aphasia noted Psych: euthymic mood, full affect  EKG:  EKG is not ordered today.  Recent Labs: 07/02/2016: ALT 24; Hemoglobin 11.9; Platelets 162; TSH 1.996 07/03/2016: Magnesium 1.8 07/09/2016: BUN 13; Creatinine, Ser 1.46; Potassium 4.0; Sodium 139   Lipid Panel     Component Value Date/Time   CHOL 104 07/03/2016 0403   TRIG 91 07/03/2016 0403   HDL 33 (L) 07/03/2016 0403   CHOLHDL 3.2 07/03/2016 0403   VLDL 18 07/03/2016 0403   LDLCALC 53 07/03/2016 0403   LDLDIRECT 59.8 10/12/2012 0737      Wt Readings from Last 3 Encounters:  10/09/16 182 lb 12.8 oz (82.9 kg)  08/10/16 181 lb (82.1 kg)  07/24/16 180 lb 12.8 oz (82 kg)     Cardiac Studies Reviewed: Echo 07/02/2016: Study Conclusions  - Left ventricle: The cavity size was normal. There was mild focal   basal hypertrophy of the septum. Systolic function was normal.   The estimated ejection fraction was in the range of 55% to 60%.   Wall motion was normal; there were no regional wall motion   abnormalities. Doppler parameters are consistent with abnormal   left ventricular relaxation (grade 1 diastolic dysfunction).  Impressions:  - Normal LV systolic function; mild diastolic dysfunction.  ASSESSMENT AND PLAN: 1.  CAD, native vessel, without angina: The patient's medications are reviewed and will be continued. He is not on antiplatelet therapy because of chronic anticoagulation. He is treated with isosorbide and lisinopril. Heart rate is in the 50s and he is not a candidate for a beta blocker because of bradycardia. He seems to be stable.  2. Bilateral calf claudication. Unclear if his symptoms are related to vascular occlusive disease, low back problems. I cannot palpate good pulses in his feet. Will obtain a lower extremity arterial study and ABIs. If he has significant arterial disease  will refer him back to Dr. Bridgett Larsson who follows him for  his vascular disease.   3. DVT/PE: Maintained on apixaban.   4. Hyperlipidemia: Treated with low-dose Crestor because of intolerance to a high intensity statin.  Current medicines are reviewed with the patient today.  The patient does not have concerns regarding medicines.  Labs/ tests ordered today include:  No orders of the defined types were placed in this encounter.   Disposition:   FU 6 months  Signed, Sherren Mocha, MD  10/09/2016 11:30 AM    [facility] [address], [place], [place]  [address] Phone: [phone]; Fax: [phone]

## 2016-10-09 NOTE — Patient Instructions (Signed)
Medication Instructions:  Your provider recommends that you continue on your current medications as directed. Please refer to the Current Medication list given to you today.    Labwork: None  Testing/Procedures: Your physician has requested that you have a lower or upper extremity arterial duplex. This test is an ultrasound of the arteries in the legs or arms. It looks at arterial blood flow in the legs and arms. Allow one hour for Lower and Upper Arterial scans. There are no restrictions or special instructions  Your physician has requested that you have an ankle brachial index (ABI). During this test an ultrasound and blood pressure cuff are used to evaluate the arteries that supply the arms and legs with blood. Allow thirty minutes for this exam. There are no restrictions or special instructions.  Follow-Up: Your provider wants you to follow-up in: 6 months with Dr. Burt Knack. You will receive a reminder letter in the mail two months in advance. If you don't receive a letter, please call our office to schedule the follow-up appointment.    Any Other Special Instructions Will Be Listed Below (If Applicable).     If you need a refill on your cardiac medications before your next appointment, please call your pharmacy.

## 2016-10-13 DIAGNOSIS — R293 Abnormal posture: Secondary | ICD-10-CM | POA: Diagnosis not present

## 2016-10-13 DIAGNOSIS — R2689 Other abnormalities of gait and mobility: Secondary | ICD-10-CM | POA: Diagnosis not present

## 2016-10-13 DIAGNOSIS — R2681 Unsteadiness on feet: Secondary | ICD-10-CM | POA: Diagnosis not present

## 2016-10-13 DIAGNOSIS — M25611 Stiffness of right shoulder, not elsewhere classified: Secondary | ICD-10-CM | POA: Diagnosis not present

## 2016-10-13 DIAGNOSIS — Z9889 Other specified postprocedural states: Secondary | ICD-10-CM | POA: Diagnosis not present

## 2016-10-13 DIAGNOSIS — M545 Low back pain: Secondary | ICD-10-CM | POA: Diagnosis not present

## 2016-10-13 DIAGNOSIS — R5383 Other fatigue: Secondary | ICD-10-CM | POA: Diagnosis not present

## 2016-10-13 DIAGNOSIS — M2560 Stiffness of unspecified joint, not elsewhere classified: Secondary | ICD-10-CM | POA: Diagnosis not present

## 2016-10-13 DIAGNOSIS — M25612 Stiffness of left shoulder, not elsewhere classified: Secondary | ICD-10-CM | POA: Diagnosis not present

## 2016-10-13 DIAGNOSIS — M5416 Radiculopathy, lumbar region: Secondary | ICD-10-CM | POA: Diagnosis not present

## 2016-10-13 DIAGNOSIS — M6281 Muscle weakness (generalized): Secondary | ICD-10-CM | POA: Diagnosis not present

## 2016-10-13 DIAGNOSIS — Z8673 Personal history of transient ischemic attack (TIA), and cerebral infarction without residual deficits: Secondary | ICD-10-CM | POA: Diagnosis not present

## 2016-10-19 ENCOUNTER — Ambulatory Visit (HOSPITAL_COMMUNITY)
Admission: RE | Admit: 2016-10-19 | Discharge: 2016-10-19 | Disposition: A | Discharge status: Home / Self Care | Payer: Medicare Other | Source: Ambulatory Visit | Attending: Cardiology | Admitting: Cardiology

## 2016-10-19 DIAGNOSIS — I739 Peripheral vascular disease, unspecified: Secondary | ICD-10-CM | POA: Insufficient documentation

## 2016-10-26 ENCOUNTER — Encounter: Payer: Self-pay | Admitting: Cardiovascular Disease

## 2016-10-26 ENCOUNTER — Ambulatory Visit: Payer: Medicare Other | Admitting: Physician Assistant

## 2016-10-27 DIAGNOSIS — D720 Genetic anomalies of leukocytes: Secondary | ICD-10-CM | POA: Diagnosis not present

## 2016-10-27 DIAGNOSIS — G7000 Myasthenia gravis without (acute) exacerbation: Secondary | ICD-10-CM | POA: Diagnosis not present

## 2016-10-27 DIAGNOSIS — Z79899 Other long term (current) drug therapy: Secondary | ICD-10-CM | POA: Diagnosis not present

## 2016-10-27 DIAGNOSIS — Z8673 Personal history of transient ischemic attack (TIA), and cerebral infarction without residual deficits: Secondary | ICD-10-CM | POA: Diagnosis not present

## 2016-10-27 DIAGNOSIS — K118 Other diseases of salivary glands: Secondary | ICD-10-CM | POA: Diagnosis not present

## 2016-10-27 DIAGNOSIS — C9211 Chronic myeloid leukemia, BCR/ABL-positive, in remission: Secondary | ICD-10-CM | POA: Diagnosis not present

## 2016-10-27 DIAGNOSIS — D649 Anemia, unspecified: Secondary | ICD-10-CM | POA: Diagnosis not present

## 2016-10-27 DIAGNOSIS — K222 Esophageal obstruction: Secondary | ICD-10-CM | POA: Diagnosis not present

## 2016-10-27 DIAGNOSIS — M180 Bilateral primary osteoarthritis of first carpometacarpal joints: Secondary | ICD-10-CM | POA: Diagnosis not present

## 2016-10-28 ENCOUNTER — Ambulatory Visit: Payer: Medicare Other | Admitting: Cardiovascular Disease

## 2016-10-28 DIAGNOSIS — K118 Other diseases of salivary glands: Secondary | ICD-10-CM | POA: Diagnosis not present

## 2016-10-28 DIAGNOSIS — K119 Disease of salivary gland, unspecified: Secondary | ICD-10-CM | POA: Diagnosis not present

## 2016-10-28 DIAGNOSIS — Z9889 Other specified postprocedural states: Secondary | ICD-10-CM | POA: Diagnosis not present

## 2016-11-03 DIAGNOSIS — H1033 Unspecified acute conjunctivitis, bilateral: Secondary | ICD-10-CM | POA: Diagnosis not present

## 2016-11-05 ENCOUNTER — Encounter: Payer: Self-pay | Admitting: Gastroenterology

## 2016-11-09 DIAGNOSIS — D1801 Hemangioma of skin and subcutaneous tissue: Secondary | ICD-10-CM | POA: Diagnosis not present

## 2016-11-09 DIAGNOSIS — L578 Other skin changes due to chronic exposure to nonionizing radiation: Secondary | ICD-10-CM | POA: Diagnosis not present

## 2016-11-09 DIAGNOSIS — L821 Other seborrheic keratosis: Secondary | ICD-10-CM | POA: Diagnosis not present

## 2016-11-09 DIAGNOSIS — L570 Actinic keratosis: Secondary | ICD-10-CM | POA: Diagnosis not present

## 2016-11-10 ENCOUNTER — Ambulatory Visit (INDEPENDENT_AMBULATORY_CARE_PROVIDER_SITE_OTHER): Payer: Medicare Other | Admitting: Cardiovascular Disease

## 2016-11-10 ENCOUNTER — Encounter: Payer: Self-pay | Admitting: Cardiovascular Disease

## 2016-11-10 VITALS — BP 148/74 | HR 66 | Ht 67.0 in | Wt 184.4 lb

## 2016-11-10 DIAGNOSIS — E785 Hyperlipidemia, unspecified: Secondary | ICD-10-CM | POA: Diagnosis not present

## 2016-11-10 DIAGNOSIS — Z01818 Encounter for other preprocedural examination: Secondary | ICD-10-CM | POA: Diagnosis not present

## 2016-11-10 DIAGNOSIS — I739 Peripheral vascular disease, unspecified: Secondary | ICD-10-CM | POA: Diagnosis not present

## 2016-11-10 DIAGNOSIS — I6523 Occlusion and stenosis of bilateral carotid arteries: Secondary | ICD-10-CM | POA: Diagnosis not present

## 2016-11-10 DIAGNOSIS — I2510 Atherosclerotic heart disease of native coronary artery without angina pectoris: Secondary | ICD-10-CM | POA: Diagnosis not present

## 2016-11-10 LAB — CBC
HEMATOCRIT: 35.7 % — AB (ref 37.5–51.0)
Hemoglobin: 11.8 g/dL — ABNORMAL LOW (ref 13.0–17.7)
MCH: 31.9 pg (ref 26.6–33.0)
MCHC: 33.1 g/dL (ref 31.5–35.7)
MCV: 97 fL (ref 79–97)
Platelets: 214 10*3/uL (ref 150–379)
RBC: 3.7 x10E6/uL — AB (ref 4.14–5.80)
RDW: 14.3 % (ref 12.3–15.4)
WBC: 9.2 10*3/uL (ref 3.4–10.8)

## 2016-11-10 LAB — BASIC METABOLIC PANEL
BUN/Creatinine Ratio: 8 — ABNORMAL LOW (ref 10–24)
BUN: 14 mg/dL (ref 8–27)
CALCIUM: 9.3 mg/dL (ref 8.6–10.2)
CO2: 26 mmol/L (ref 20–29)
Chloride: 103 mmol/L (ref 96–106)
Creatinine, Ser: 1.71 mg/dL — ABNORMAL HIGH (ref 0.76–1.27)
GFR, EST AFRICAN AMERICAN: 43 mL/min/{1.73_m2} — AB (ref >59)
GFR, EST NON AFRICAN AMERICAN: 38 mL/min/{1.73_m2} — AB (ref >59)
Glucose: 103 mg/dL — ABNORMAL HIGH (ref 65–99)
Potassium: 4.4 mmol/L (ref 3.5–5.2)
Sodium: 141 mmol/L (ref 134–144)

## 2016-11-10 LAB — PROTIME-INR
INR: 1.1 (ref 0.8–1.2)
Prothrombin Time: 11.2 s (ref 9.1–12.0)

## 2016-11-10 MED ORDER — ASPIRIN EC 81 MG PO TBEC: 81.0000 mg | DELAYED_RELEASE_TABLET | Freq: Every day | ORAL | Status: DC

## 2016-11-10 NOTE — Patient Instructions (Signed)
   [facility] [address] [name] [address] Dept: [phone] Loc: [place]  11/10/2016  You are scheduled for a Peripheral Angiogram on Wednesday, October 10 at 8:30 with Dr. [name].  1. Please arrive at the [facility] (Main Entrance A) at [facility]: [address] [place], [place] [address] at 6:30 AM (two hours before your procedure to ensure your preparation). Free valet parking service is available.   Special note: Every effort is made to have your procedure done on time. Please understand that emergencies sometimes delay scheduled procedures.  2. Diet: Do not eat or drink anything after midnight prior to your procedure except sips of water to take medications.  3. Labs: Please have your labs drawn today, 10/2, at our [facility] office.  4. Medication instructions in preparation for your procedure:   Stop Eliquis on Sunday October 7th. Hold Furosemide the morning of the procedure.    On the morning of your procedure, take your Aspirin and any morning medicines NOT listed above.  You may use sips of water.  5. Plan for one night stay--bring personal belongings. 6. Bring a current list of your medications and current insurance cards. 7. You MUST have a responsible person to drive you home. 8. Someone MUST be with you the first 24 hours after you arrive home or your discharge will be delayed. 9. Please wear clothes that are easy to get on and off and wear slip-on shoes.  Thank you for allowing [place] to care for you!   -- [facility]

## 2016-11-10 NOTE — Progress Notes (Signed)
Cardiology Office Note   Date:  11/10/2016   ID:  [name], DOB [birth year], MRN [id number]  PCP:  Renato Shin, MD  Cardiologist:  Dr. Burt Knack  No chief complaint on file.     History of Present Illness: [name] is a 76 y.o. male who was referred by Dr. Burt Knack for evaluation and management of peripheral arterial disease. 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 He reports severe bilateral calf discomfort with minimal distance walking. This started about 3-6 months ago and has been worsening. He cannot go to the mailbox without having to stop and rest for few minutes. This has significantly affected his ability to perform activities of daily living. He reports being active in the past and used to walk all the time for exercise. He used to be a runner in his younger age. He reports that the pain is in both calfs and slightly worse than the left side. He underwent noninvasive vascular evaluation which showed severely reduced ABI on the right side and moderately reduced on the left side. Duplex was overall suboptimal. It did show occluded left SFA. He has no rest pain or lower extremity ulceration. The patient has borderline diabetes not currently not on medications. He is not a smoker. He reports significant claustrophobia.   Past Medical History:  Diagnosis Date  . Bruises easily    d/t being on Eliquis  . Carotid artery disease ([place])    a. Duplex 05/2014: 81-19% RICA, 1-47% LICA, elevated velocities in right subclavian artery, normal left subclavian artery..  . Chronic back pain    HNP  . Chronic kidney disease (CKD)   . Claustrophobia    takes Ativan as needed  . 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 Coronary atherosclerosis of unspecified type of vessel, native or graft    a. cath in 2003 showed 10-20% LM, scattered 20% prox-mid LAD, 30% more  distal LAD, 50-60% stenosis of LAD towards apex, 20% Cx, 30% dRCA, focal 95% stenosis in smaller of 2 branches of PDA, EF 60-65%.  . Diabetes mellitus ([place])   . Diarrhea    takes Imodium daily as needed  . Diverticulosis   . Diverticulosis of colon (without mention of hemorrhage)   . DJD (degenerative joint disease)   . DVT (deep venous thrombosis) ([place]) 07/2011  . Esophageal stricture   . Essential hypertension    takes Imdur and Lisinopril daily  . Gastric polyps    benign  . GERD (gastroesophageal reflux disease)    takes Nexium daily  . Glaucoma    uses eye drops  . History of bronchitis    not sure when the last time  . History of colon polyps    benign  . History of kidney stones   . History of kidney stones   . Insomnia    takes Melatonin nightly as needed  . Mixed hyperlipidemia    takes Crestor daily  . Myasthenia gravis ([place])    uses prednisone  . Osteoporosis 2016  . Peripheral edema    takes Lasix daily  . Pituitary tumor    checks Dr[facility]Walden at [facility] checks it every Jan   . Pneumonia 01/2016  . PONV (postoperative nausea and vomiting)   . Ptosis   . Pulmonary embolus ([place]) 2012  . Stroke [facility]) 06/2015    Past Surgical History:  Procedure Laterality Date  .  BACK SURGERY     x2, lumbar and cervical  . CARDIAC CATHETERIZATION    . CATARACT EXTRACTION Bilateral 12/12  . COLONOSCOPY    . ESOPHAGOGASTRODUODENOSCOPY ENDOSCOPY  04/2015  . IR GENERIC HISTORICAL  03/03/2016   IR IVC FILTER PLMT / S&I [name] GUID/MOD SED 03/03/2016 [name], MD [facility]-INTERV RAD  . IR GENERIC HISTORICAL  04/09/2016   IR RADIOLOGIST EVAL & MGMT 04/09/2016 [name], MD [facility] INTERV RAD  . IR GENERIC HISTORICAL  04/30/2016   IR IVC FILTER RETRIEVAL / S&I [name] GUID/MOD SED 04/30/2016 [name], MD [facility]-INTERV RAD  . LITHOTRIPSY    . LUMBAR LAMINECTOMY/DECOMPRESSION MICRODISCECTOMY Right 09/14/2012   Procedure: Right Lumbar three-four, four-five, Lumbar five-Sacral one decompressive  laminectomy;  Surgeon: [name], MD;  Location: [place] [facility];  Service: Neurosurgery;  Laterality: Right;  . LUMBAR LAMINECTOMY/DECOMPRESSION MICRODISCECTOMY Left 03/11/2016   Procedure: Left Lumbar Two-ThreeMicrodiscectomy;  Surgeon: [name], MD;  Location: [place];  Service: Neurosurgery;  Laterality: Left;  . SHOULDER SURGERY Left 05/07/2009  . TONSILLECTOMY       Current Outpatient Prescriptions  Medication Sig Dispense Refill  . aspirin EC 81 MG tablet Take 1 tablet (81 mg total) by mouth daily.    . brimonidine-timolol (COMBIGAN) 0.2-0.5 % ophthalmic solution Place 1 drop into both eyes every 12 (twelve) hours.    . cholecalciferol (VITAMIN D) 1000 units tablet Take 2,000 Units by mouth daily.    Marland Kitchen ELIQUIS 5 MG TABS tablet TAKE 1 TABLET (5 MG TOTAL) BY MOUTH 2 (TWO) TIMES DAILY. 60 tablet 3  . esomeprazole (NEXIUM) 40 MG capsule Take 40 mg by mouth daily.     . furosemide (LASIX) 20 MG tablet TAKE 1 TABLET BY MOUTH DAILY. 90 tablet 2  . imatinib (GLEEVEC) 100 MG tablet Take 300 mg by mouth daily. Take with meals and large glass of water.Caution:Chemotherapy    . isosorbide mononitrate (IMDUR) 30 MG 24 hr tablet Take 1 tablet (30 mg total) by mouth daily. 90 tablet 3  . lidocaine (LIDODERM) 5 % Place 1 patch onto the skin daily as needed (pain). Remove & Discard patch within 12 hours or as directed by MD    . lisinopril (PRINIVIL,ZESTRIL) 5 MG tablet Take 1 tablet (5 mg total) by mouth daily. 30 tablet 11  . loperamide (IMODIUM) 2 MG capsule Take 2 mg by mouth 3 (three) times daily as needed for diarrhea or loose stools. For diarrhea    . LORazepam (ATIVAN) 0.5 MG tablet Take 0.5 mg by mouth at bedtime as needed for sleep.    . Melatonin 3 MG TABS Take 3 mg by mouth at bedtime as needed (for sleep).     . Multiple Vitamin (MULTIVITAMIN) tablet Take 1 tablet by mouth daily.    Marland Kitchen NITROSTAT 0.4 MG SL tablet PLACE 1 TABLET UNDER THE TONGUE EVERY 5 MINUTES X3 DOSES AS NEEDED FOR CHEST  PAIN 100 tablet 1  . ondansetron (ZOFRAN ODT) 8 MG disintegrating tablet Take 1 tablet (8 mg total) by mouth every 8 (eight) hours as needed for nausea or vomiting. 90 tablet 1  . predniSONE (DELTASONE) 5 MG tablet TAKE 1 TABLET BY MOUTH DAILY. 90 tablet 1  . promethazine (PHENERGAN) 12.5 MG tablet Take 1 tablet (12.5 mg total) by mouth every 6 (six) hours as needed for nausea or vomiting. 30 tablet 0  . pyridostigmine (MESTINON) 60 MG tablet Take 60 mg by mouth daily.    . rosuvastatin (CRESTOR) 10  MG tablet Take 5 mg by mouth daily.    . traMADol (ULTRAM) 50 MG tablet Take 50 mg by mouth every 6 (six) hours as needed (pain).     No current facility-administered medications for this visit.     Allergies:   Phenergan [promethazine hcl]; Zofran [ondansetron hcl]; and Sulfamethoxazole    Social History:  The patient  reports that he has never smoked. He has never used smokeless tobacco. He reports that he does not drink alcohol or use drugs.   Family History:  The patient's family history includes Breast cancer in his sister; Clotting disorder in his sister; Dementia in his sister; Heart attack in his father, mother, sister, sister, and sister; Heart disease in his father, mother, and sister; Stroke in his father and mother.    ROS:  Please see the history of present illness.   Otherwise, review of systems are positive for none.   All other systems are reviewed and negative.    PHYSICAL EXAM: VS:  BP (!) 148/74 (BP Location: Right Arm)   Pulse 66   Ht 5\' 7"  (1.702 m)   Wt 184 lb 6.4 oz (83.6 kg)   BMI 28.88 kg/m  , BMI Body mass index is 28.88 kg/m. GEN: Well nourished, well developed, in no acute distress  HEENT: normal  Neck: no JVD, carotid bruits, or masses Cardiac: RRR; no murmurs, rubs, or gallops,no edema  Respiratory:  clear to auscultation bilaterally, normal work of breathing GI: soft, nontender, nondistended, + BS MS: no deformity or atrophy  Skin: warm and dry, no  rash Neuro:  Strength and sensation are intact Psych: euthymic mood, full affect Vascular: Femoral pulses +1 on the right side and +2 on the left side. Distal pulses are not palpable.  EKG:  EKG is not ordered today.   Recent Labs: 07/02/2016: ALT 24; TSH 1.996 07/03/2016: Magnesium 1.8 11/10/2016: BUN 14; Creatinine, Ser 1.71; Hemoglobin 11.8; Platelets 214; Potassium 4.4; Sodium 141    Lipid Panel    Component Value Date/Time   CHOL 104 07/03/2016 0403   TRIG 91 07/03/2016 0403   HDL 33 (L) 07/03/2016 0403   CHOLHDL 3.2 07/03/2016 0403   VLDL 18 07/03/2016 0403   LDLCALC 53 07/03/2016 0403   LDLDIRECT 59.8 10/12/2012 0737      Wt Readings from Last 3 Encounters:  11/10/16 184 lb 6.4 oz (83.6 kg)  10/09/16 182 lb 12.8 oz (82.9 kg)  08/10/16 181 lb (82.1 kg)       No flowsheet data found.    ASSESSMENT AND PLAN:  1.  Peripheral arterial disease with severe bilateral leg claudication likely due to multilevel disease. The patient's exam is suggestive of inflow disease on the right side. Distal pulses are not palpable. I discussed with him the natural history and management of peripheral arterial disease. I discussed the option of attempted walking exercise program. However, he reports that he has not been able to do even short distance walking. He already has attempted that and has not been able to walk more than 25 yards without having to stop due to severe pain. Due to severity of his symptoms, I recommend proceeding with abdominal aortogram, lower extremity runoff and possible endovascular intervention. I discussed the procedure in details as well as risk and benefits. Hold Eliquis 2 days before the procedure. Start aspirin 81 mg once daily.  2. Coronary artery disease involving native coronary arteries without angina: Continue medical therapy.  3. CML: Currently on Gleevec.  4.  Hyperlipidemia: Continue treatment with rosuvastatin with a target LDL of less than  70.    Disposition:   FU with me in 1 month  Signed,  [name], MD  11/10/2016 4:40 PM    [facility]

## 2016-11-13 ENCOUNTER — Telehealth: Payer: Self-pay | Admitting: *Deleted

## 2016-11-13 NOTE — Telephone Encounter (Addendum)
-----   Message from [name], MD sent at 11/13/2016  1:13 PM EDT ----- Pre-angiogram labs showed slightly worse kidney function. I recommend holding Lasix 2 days before the angiogram. We need to hydrate him for 4 hours before the procedure and thus we should make his procedure at 10:30 with arrival to short stay at 6:30 for hydration.   Spoke with [name], aware of dr Tyrell Antonio recommendations. Spoke with the cath lab and procedure moved to 10:30 am.

## 2016-11-18 ENCOUNTER — Ambulatory Visit (HOSPITAL_COMMUNITY)
Admission: RE | Disposition: A | Discharge status: Home / Self Care | Payer: Self-pay | Source: Ambulatory Visit | Attending: Cardiovascular Disease

## 2016-11-18 ENCOUNTER — Ambulatory Visit (HOSPITAL_COMMUNITY)
Admission: RE | Admit: 2016-11-18 | Discharge: 2016-11-19 | Disposition: A | Discharge status: Home / Self Care | Payer: Medicare Other | Source: Ambulatory Visit | Attending: Cardiovascular Disease | Admitting: Cardiovascular Disease

## 2016-11-18 DIAGNOSIS — H409 Unspecified glaucoma: Secondary | ICD-10-CM | POA: Insufficient documentation

## 2016-11-18 DIAGNOSIS — I2510 Atherosclerotic heart disease of native coronary artery without angina pectoris: Secondary | ICD-10-CM | POA: Insufficient documentation

## 2016-11-18 DIAGNOSIS — Z7982 Long term (current) use of aspirin: Secondary | ICD-10-CM | POA: Insufficient documentation

## 2016-11-18 DIAGNOSIS — G8929 Other chronic pain: Secondary | ICD-10-CM | POA: Insufficient documentation

## 2016-11-18 DIAGNOSIS — Z8673 Personal history of transient ischemic attack (TIA), and cerebral infarction without residual deficits: Secondary | ICD-10-CM | POA: Insufficient documentation

## 2016-11-18 DIAGNOSIS — M1990 Unspecified osteoarthritis, unspecified site: Secondary | ICD-10-CM | POA: Insufficient documentation

## 2016-11-18 DIAGNOSIS — I70212 Atherosclerosis of native arteries of extremities with intermittent claudication, left leg: Secondary | ICD-10-CM | POA: Diagnosis not present

## 2016-11-18 DIAGNOSIS — N189 Chronic kidney disease, unspecified: Secondary | ICD-10-CM | POA: Insufficient documentation

## 2016-11-18 DIAGNOSIS — C9210 Chronic myeloid leukemia, BCR/ABL-positive, not having achieved remission: Secondary | ICD-10-CM | POA: Insufficient documentation

## 2016-11-18 DIAGNOSIS — E782 Mixed hyperlipidemia: Secondary | ICD-10-CM | POA: Insufficient documentation

## 2016-11-18 DIAGNOSIS — Z79899 Other long term (current) drug therapy: Secondary | ICD-10-CM | POA: Insufficient documentation

## 2016-11-18 DIAGNOSIS — Z86711 Personal history of pulmonary embolism: Secondary | ICD-10-CM | POA: Insufficient documentation

## 2016-11-18 DIAGNOSIS — I129 Hypertensive chronic kidney disease with stage 1 through stage 4 chronic kidney disease, or unspecified chronic kidney disease: Secondary | ICD-10-CM | POA: Diagnosis not present

## 2016-11-18 DIAGNOSIS — Z7901 Long term (current) use of anticoagulants: Secondary | ICD-10-CM | POA: Insufficient documentation

## 2016-11-18 DIAGNOSIS — K219 Gastro-esophageal reflux disease without esophagitis: Secondary | ICD-10-CM | POA: Diagnosis not present

## 2016-11-18 DIAGNOSIS — Z882 Allergy status to sulfonamides status: Secondary | ICD-10-CM | POA: Insufficient documentation

## 2016-11-18 DIAGNOSIS — I739 Peripheral vascular disease, unspecified: Secondary | ICD-10-CM | POA: Diagnosis not present

## 2016-11-18 DIAGNOSIS — Z86718 Personal history of other venous thrombosis and embolism: Secondary | ICD-10-CM | POA: Diagnosis not present

## 2016-11-18 DIAGNOSIS — F5101 Primary insomnia: Secondary | ICD-10-CM | POA: Insufficient documentation

## 2016-11-18 DIAGNOSIS — E1122 Type 2 diabetes mellitus with diabetic chronic kidney disease: Secondary | ICD-10-CM | POA: Insufficient documentation

## 2016-11-18 DIAGNOSIS — Z888 Allergy status to other drugs, medicaments and biological substances status: Secondary | ICD-10-CM | POA: Insufficient documentation

## 2016-11-18 HISTORY — PX: PERIPHERAL VASCULAR INTERVENTION: CATH118257

## 2016-11-18 HISTORY — PX: ABDOMINAL AORTOGRAM W/LOWER EXTREMITY: CATH118223

## 2016-11-18 LAB — POCT ACTIVATED CLOTTING TIME
ACTIVATED CLOTTING TIME: 219 s
ACTIVATED CLOTTING TIME: 241 s
Activated Clotting Time: 197 seconds

## 2016-11-18 LAB — GLUCOSE, CAPILLARY
GLUCOSE-CAPILLARY: 99 mg/dL (ref 65–99)
Glucose-Capillary: 93 mg/dL (ref 65–99)

## 2016-11-18 SURGERY — ABDOMINAL AORTOGRAM W/LOWER EXTREMITY
Anesthesia: LOCAL

## 2016-11-18 MED ORDER — FENTANYL CITRATE (PF) 100 MCG/2ML IJ SOLN
INTRAMUSCULAR | Status: AC
  Filled 2016-11-18: qty 2

## 2016-11-18 MED ORDER — BRIMONIDINE TARTRATE 0.2 % OP SOLN
1.0000 [drp] | Freq: Two times a day (BID) | OPHTHALMIC | Status: DC
  Filled 2016-11-18: qty 5

## 2016-11-18 MED ORDER — BRIMONIDINE TARTRATE-TIMOLOL 0.2-0.5 % OP SOLN: 1.0000 [drp] | Freq: Two times a day (BID) | OPHTHALMIC | Status: DC

## 2016-11-18 MED ORDER — SODIUM CHLORIDE 0.9 % IV SOLN: 250.0000 mL | INTRAVENOUS | Status: DC | PRN

## 2016-11-18 MED ORDER — PROMETHAZINE HCL 25 MG PO TABS: 12.5000 mg | ORAL_TABLET | Freq: Four times a day (QID) | ORAL | Status: DC | PRN

## 2016-11-18 MED ORDER — MELATONIN 3 MG PO TABS
3.0000 mg | ORAL_TABLET | Freq: Every evening | ORAL | Status: DC | PRN
  Filled 2016-11-18: qty 1

## 2016-11-18 MED ORDER — SODIUM CHLORIDE 0.9 % WEIGHT BASED INFUSION
1.0000 mL/kg/h | INTRAVENOUS | Status: AC
  Administered 2016-11-18: 1 mL/kg/h via INTRAVENOUS

## 2016-11-18 MED ORDER — ASPIRIN 81 MG PO CHEW: 81.0000 mg | CHEWABLE_TABLET | ORAL | Status: DC

## 2016-11-18 MED ORDER — TIMOLOL MALEATE 0.5 % OP SOLN
1.0000 [drp] | Freq: Two times a day (BID) | OPHTHALMIC | Status: DC
  Filled 2016-11-18: qty 5

## 2016-11-18 MED ORDER — SODIUM CHLORIDE 0.9% FLUSH: 3.0000 mL | INTRAVENOUS | Status: DC | PRN

## 2016-11-18 MED ORDER — LOPERAMIDE HCL 2 MG PO CAPS: 2.0000 mg | ORAL_CAPSULE | Freq: Three times a day (TID) | ORAL | Status: DC | PRN

## 2016-11-18 MED ORDER — PANTOPRAZOLE SODIUM 40 MG PO TBEC: 40.0000 mg | DELAYED_RELEASE_TABLET | Freq: Every day | ORAL | Status: DC

## 2016-11-18 MED ORDER — VITAMIN D 1000 UNITS PO TABS: 1000.0000 [IU] | ORAL_TABLET | Freq: Every day | ORAL | Status: DC

## 2016-11-18 MED ORDER — PYRIDOSTIGMINE BROMIDE 60 MG PO TABS
60.0000 mg | ORAL_TABLET | Freq: Every day | ORAL | Status: DC
  Administered 2016-11-18: 60 mg via ORAL
  Filled 2016-11-18: qty 1

## 2016-11-18 MED ORDER — LORAZEPAM 0.5 MG PO TABS: 0.5000 mg | ORAL_TABLET | Freq: Every evening | ORAL | Status: DC | PRN

## 2016-11-18 MED ORDER — ANGIOPLASTY BOOK
Freq: Once | Status: AC
  Administered 2016-11-18: 21:00:00
  Filled 2016-11-18: qty 1

## 2016-11-18 MED ORDER — MIDAZOLAM HCL 2 MG/2ML IJ SOLN
INTRAMUSCULAR | Status: DC | PRN
  Administered 2016-11-18 (×2): 1 mg via INTRAVENOUS

## 2016-11-18 MED ORDER — ROSUVASTATIN CALCIUM 5 MG PO TABS
5.0000 mg | ORAL_TABLET | Freq: Every day | ORAL | Status: DC
  Filled 2016-11-18: qty 1

## 2016-11-18 MED ORDER — TRAMADOL HCL 50 MG PO TABS: 50.0000 mg | ORAL_TABLET | Freq: Four times a day (QID) | ORAL | Status: DC | PRN

## 2016-11-18 MED ORDER — ALUM & MAG HYDROXIDE-SIMETH 200-200-20 MG/5ML PO SUSP
15.0000 mL | Freq: Four times a day (QID) | ORAL | Status: DC | PRN
Start: 2016-11-18 — End: 2016-11-19

## 2016-11-18 MED ORDER — MIDAZOLAM HCL 2 MG/2ML IJ SOLN
INTRAMUSCULAR | Status: AC
  Filled 2016-11-18: qty 2

## 2016-11-18 MED ORDER — CLOPIDOGREL BISULFATE 75 MG PO TABS
75.0000 mg | ORAL_TABLET | Freq: Every day | ORAL | Status: DC
  Administered 2016-11-19: 75 mg via ORAL
  Filled 2016-11-18: qty 1

## 2016-11-18 MED ORDER — PYRIDOSTIGMINE BROMIDE 60 MG PO TABS
60.0000 mg | ORAL_TABLET | Freq: Every day | ORAL | Status: DC
  Filled 2016-11-18: qty 1

## 2016-11-18 MED ORDER — HEPARIN (PORCINE) IN NACL 2-0.9 UNIT/ML-% IJ SOLN
INTRAMUSCULAR | Status: AC
  Filled 2016-11-18: qty 1000

## 2016-11-18 MED ORDER — ADULT MULTIVITAMIN W/MINERALS CH
1.0000 | ORAL_TABLET | Freq: Every day | ORAL | Status: DC
  Administered 2016-11-18: 1 via ORAL
  Filled 2016-11-18 (×3): qty 1

## 2016-11-18 MED ORDER — IMATINIB MESYLATE 100 MG PO TABS: 300.0000 mg | ORAL_TABLET | Freq: Every day | ORAL | Status: DC

## 2016-11-18 MED ORDER — FENTANYL CITRATE (PF) 100 MCG/2ML IJ SOLN
INTRAMUSCULAR | Status: DC | PRN
  Administered 2016-11-18: 25 ug via INTRAVENOUS

## 2016-11-18 MED ORDER — IODIXANOL 320 MG/ML IV SOLN
INTRAVENOUS | Status: DC | PRN
  Administered 2016-11-18: 60 mL via INTRA_ARTERIAL

## 2016-11-18 MED ORDER — ASPIRIN EC 81 MG PO TBEC
81.0000 mg | DELAYED_RELEASE_TABLET | Freq: Every day | ORAL | Status: DC
  Filled 2016-11-18: qty 1

## 2016-11-18 MED ORDER — CLOPIDOGREL BISULFATE 300 MG PO TABS
ORAL_TABLET | ORAL | Status: DC | PRN
  Administered 2016-11-18: 300 mg via ORAL

## 2016-11-18 MED ORDER — HEPARIN (PORCINE) IN NACL 2-0.9 UNIT/ML-% IJ SOLN
INTRAMUSCULAR | Status: AC | PRN
  Administered 2016-11-18: 1000 mL

## 2016-11-18 MED ORDER — LIDOCAINE HCL 2 % IJ SOLN
INTRAMUSCULAR | Status: AC
  Filled 2016-11-18: qty 10

## 2016-11-18 MED ORDER — ISOSORBIDE MONONITRATE ER 30 MG PO TB24
30.0000 mg | ORAL_TABLET | Freq: Every day | ORAL | Status: DC
  Filled 2016-11-18: qty 1

## 2016-11-18 MED ORDER — FENTANYL CITRATE (PF) 100 MCG/2ML IJ SOLN
50.0000 ug | Freq: Once | INTRAMUSCULAR | Status: AC
  Administered 2016-11-18: 50 ug via INTRAVENOUS

## 2016-11-18 MED ORDER — SODIUM CHLORIDE 0.9% FLUSH: 3.0000 mL | Freq: Two times a day (BID) | INTRAVENOUS | Status: DC

## 2016-11-18 MED ORDER — FUROSEMIDE 20 MG PO TABS
20.0000 mg | ORAL_TABLET | Freq: Every day | ORAL | Status: DC
  Filled 2016-11-18: qty 1

## 2016-11-18 MED ORDER — SODIUM CHLORIDE 0.9 % IV SOLN
INTRAVENOUS | Status: DC
  Administered 2016-11-18: 07:00:00 via INTRAVENOUS

## 2016-11-18 MED ORDER — ACETAMINOPHEN 325 MG PO TABS
650.0000 mg | ORAL_TABLET | ORAL | Status: DC | PRN
  Administered 2016-11-18: 650 mg via ORAL
  Filled 2016-11-18: qty 2

## 2016-11-18 MED ORDER — ONDANSETRON 8 MG PO TBDP
8.0000 mg | ORAL_TABLET | Freq: Three times a day (TID) | ORAL | Status: DC | PRN
  Filled 2016-11-18: qty 1

## 2016-11-18 MED ORDER — LISINOPRIL 5 MG PO TABS
5.0000 mg | ORAL_TABLET | Freq: Every day | ORAL | Status: DC
  Filled 2016-11-18: qty 1

## 2016-11-18 MED ORDER — PREDNISONE 5 MG PO TABS
5.0000 mg | ORAL_TABLET | Freq: Every day | ORAL | Status: DC
  Filled 2016-11-18: qty 1

## 2016-11-18 MED ORDER — CLOPIDOGREL BISULFATE 300 MG PO TABS
ORAL_TABLET | ORAL | Status: AC
  Filled 2016-11-18: qty 1

## 2016-11-18 MED ORDER — HEPARIN SODIUM (PORCINE) 1000 UNIT/ML IJ SOLN
INTRAMUSCULAR | Status: DC | PRN
  Administered 2016-11-18: 2000 [IU] via INTRAVENOUS
  Administered 2016-11-18: 8000 [IU] via INTRAVENOUS

## 2016-11-18 MED ORDER — LIDOCAINE HCL (PF) 1 % IJ SOLN
INTRAMUSCULAR | Status: DC | PRN
  Administered 2016-11-18: 10 mL

## 2016-11-18 SURGICAL SUPPLY — 29 items
BALLN ARMADA 6X40X135 (BALLOONS) ×3
BALLN IN.PACT DCB 6X80 (BALLOONS) ×3
BALLN STERLING OTW 5X60X135 (BALLOONS) ×3
BALLOON ARMADA 6X40X135 (BALLOONS) ×2 IMPLANT
BALLOON STERLING OTW 5X60X135 (BALLOONS) ×2 IMPLANT
CATH ANGIO 5F PIGTAIL 65CM (CATHETERS) ×3 IMPLANT
CATH CROSS OVER TEMPO 5F (CATHETERS) ×3 IMPLANT
CATH NAVICROSS ST .035X135CM (MICROCATHETER) ×3 IMPLANT
CATH STRAIGHT 5FR 65CM (CATHETERS) ×3 IMPLANT
DCB IN.PACT 6X80 (BALLOONS) ×2 IMPLANT
DEVICE SPIDERFX EMB PROT 6MM (WIRE) ×3 IMPLANT
GUIDEWIRE ANGLED .035X150CM (WIRE) ×3 IMPLANT
GUIDEWIRE ANGLED .035X260CM (WIRE) ×3 IMPLANT
KIT ENCORE 26 ADVANTAGE (KITS) ×3 IMPLANT
KIT MICROINTRODUCER STIFF 5F (SHEATH) ×3 IMPLANT
KIT PV (KITS) ×3 IMPLANT
RESERVOIR CO2 (VASCULAR PRODUCTS) ×6 IMPLANT
SHEATH PINNACLE 5F 10CM (SHEATH) ×3 IMPLANT
SHEATH PINNACLE ST 6F 45CM (SHEATH) ×3 IMPLANT
STENT ABSOLUTE PRO 7X40X135 (Permanent Stent) ×3 IMPLANT
STOPCOCK MORSE 400PSI 3WAY (MISCELLANEOUS) ×3 IMPLANT
SYRINGE MEDRAD AVANTA MACH 7 (SYRINGE) ×3 IMPLANT
TAPE VIPERTRACK RADIOPAQ (MISCELLANEOUS) ×2 IMPLANT
TAPE VIPERTRACK RADIOPAQUE (MISCELLANEOUS) ×3
TRANSDUCER W/STOPCOCK (MISCELLANEOUS) ×3 IMPLANT
TRAY PV CATH (CUSTOM PROCEDURE TRAY) ×3 IMPLANT
TUBING CIL FLEX 10 FLL-RA (TUBING) ×6 IMPLANT
WIRE HITORQ VERSACORE ST 145CM (WIRE) ×3 IMPLANT
WIRE TORQFLEX AUST .018X40CM (WIRE) ×3 IMPLANT

## 2016-11-18 NOTE — H&P (View-Only) (Signed)
Cardiology Office Note   Date:  11/10/2016   ID:  [name], DOB [birth year], MRN [id number]  PCP:  Renato Shin, MD  Cardiologist:  Dr. Burt Knack  No chief complaint on file.     History of Present Illness: [name] is a 76 y.o. male who was referred by Dr. Burt Knack for evaluation and management of peripheral arterial disease. 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 He reports severe bilateral calf discomfort with minimal distance walking. This started about 3-6 months ago and has been worsening. He cannot go to the mailbox without having to stop and rest for few minutes. This has significantly affected his ability to perform activities of daily living. He reports being active in the past and used to walk all the time for exercise. He used to be a runner in his younger age. He reports that the pain is in both calfs and slightly worse than the left side. He underwent noninvasive vascular evaluation which showed severely reduced ABI on the right side and moderately reduced on the left side. Duplex was overall suboptimal. It did show occluded left SFA. He has no rest pain or lower extremity ulceration. The patient has borderline diabetes not currently not on medications. He is not a smoker. He reports significant claustrophobia.   Past Medical History:  Diagnosis Date  . Bruises easily    d/t being on Eliquis  . Carotid artery disease ([place])    a. Duplex 05/2014: 63-87% RICA, 5-64% LICA, elevated velocities in right subclavian artery, normal left subclavian artery..  . Chronic back pain    HNP  . Chronic kidney disease (CKD)   . Claustrophobia    takes Ativan as needed  . 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 Coronary atherosclerosis of unspecified type of vessel, native or graft    a. cath in 2003 showed 10-20% LM, scattered 20% prox-mid LAD, 30% more  distal LAD, 50-60% stenosis of LAD towards apex, 20% Cx, 30% dRCA, focal 95% stenosis in smaller of 2 branches of PDA, EF 60-65%.  . Diabetes mellitus ([place])   . Diarrhea    takes Imodium daily as needed  . Diverticulosis   . Diverticulosis of colon (without mention of hemorrhage)   . DJD (degenerative joint disease)   . DVT (deep venous thrombosis) ([place]) 07/2011  . Esophageal stricture   . Essential hypertension    takes Imdur and Lisinopril daily  . Gastric polyps    benign  . GERD (gastroesophageal reflux disease)    takes Nexium daily  . Glaucoma    uses eye drops  . History of bronchitis    not sure when the last time  . History of colon polyps    benign  . History of kidney stones   . History of kidney stones   . Insomnia    takes Melatonin nightly as needed  . Mixed hyperlipidemia    takes Crestor daily  . Myasthenia gravis ([place])    uses prednisone  . Osteoporosis 2016  . Peripheral edema    takes Lasix daily  . Pituitary tumor    [facility]Walden at [facility] checks it every Jan   . Pneumonia 01/2016  . PONV (postoperative nausea and vomiting)   . Ptosis   . Pulmonary embolus ([place]) 2012  . Stroke [facility]) 06/2015    Past Surgical History:  Procedure Laterality Date  .  BACK SURGERY     x2, lumbar and cervical  . CARDIAC CATHETERIZATION    . CATARACT EXTRACTION Bilateral 12/12  . COLONOSCOPY    . ESOPHAGOGASTRODUODENOSCOPY ENDOSCOPY  04/2015  . IR GENERIC HISTORICAL  03/03/2016   IR IVC FILTER PLMT / S&I [name] GUID/MOD SED 03/03/2016 [name], MD [facility]INTER[facility]  . IR GENERIC HISTORICAL  04/09/2016   IR RADIOLOGIST EVAL & MGMT 04/09/2016 [name], MD [facility] INTERV RAD  . IR GENERIC HISTORICAL  04/30/2016   IR IVC FILTER RETRIEVAL / S&I [name] GUID/MOD SED 04/30/2016 [name], MD [facility]  . LITHOTRIPSY    . LUMBAR LAMINECTOMY/DECOMPRESSION MICRODISCECTOMY Right 09/14/2012   Procedure: Right Lumbar three-four, four-five, Lumbar five-Sacral one decompressive  laminectomy;  Surgeon: [name], MD;  Location: [place] [facility];  Service: Neurosurgery;  Laterality: Right;  . LUMBAR LAMINECTOMY/DECOMPRESSION MICRODISCECTOMY Left 03/11/2016   Procedure: Left Lumbar Two-ThreeMicrodiscectomy;  Surgeon: [name], MD;  Location: [place];  Service: Neurosurgery;  Laterality: Left;  . SHOULDER SURGERY Left 05/07/2009  . TONSILLECTOMY       Current Outpatient Prescriptions  Medication Sig Dispense Refill  . aspirin EC 81 MG tablet Take 1 tablet (81 mg total) by mouth daily.    . brimonidine-timolol (COMBIGAN) 0.2-0.5 % ophthalmic solution Place 1 drop into both eyes every 12 (twelve) hours.    . cholecalciferol (VITAMIN D) 1000 units tablet Take 2,000 Units by mouth daily.    Marland Kitchen ELIQUIS 5 MG TABS tablet TAKE 1 TABLET (5 MG TOTAL) BY MOUTH 2 (TWO) TIMES DAILY. 60 tablet 3  . esomeprazole (NEXIUM) 40 MG capsule Take 40 mg by mouth daily.     . furosemide (LASIX) 20 MG tablet TAKE 1 TABLET BY MOUTH DAILY. 90 tablet 2  . imatinib (GLEEVEC) 100 MG tablet Take 300 mg by mouth daily. Take with meals and large glass of water.Caution:Chemotherapy    . isosorbide mononitrate (IMDUR) 30 MG 24 hr tablet Take 1 tablet (30 mg total) by mouth daily. 90 tablet 3  . lidocaine (LIDODERM) 5 % Place 1 patch onto the skin daily as needed (pain). Remove & Discard patch within 12 hours or as directed by MD    . lisinopril (PRINIVIL,ZESTRIL) 5 MG tablet Take 1 tablet (5 mg total) by mouth daily. 30 tablet 11  . loperamide (IMODIUM) 2 MG capsule Take 2 mg by mouth 3 (three) times daily as needed for diarrhea or loose stools. For diarrhea    . LORazepam (ATIVAN) 0.5 MG tablet Take 0.5 mg by mouth at bedtime as needed for sleep.    . Melatonin 3 MG TABS Take 3 mg by mouth at bedtime as needed (for sleep).     . Multiple Vitamin (MULTIVITAMIN) tablet Take 1 tablet by mouth daily.    Marland Kitchen NITROSTAT 0.4 MG SL tablet PLACE 1 TABLET UNDER THE TONGUE EVERY 5 MINUTES X3 DOSES AS NEEDED FOR CHEST  PAIN 100 tablet 1  . ondansetron (ZOFRAN ODT) 8 MG disintegrating tablet Take 1 tablet (8 mg total) by mouth every 8 (eight) hours as needed for nausea or vomiting. 90 tablet 1  . predniSONE (DELTASONE) 5 MG tablet TAKE 1 TABLET BY MOUTH DAILY. 90 tablet 1  . promethazine (PHENERGAN) 12.5 MG tablet Take 1 tablet (12.5 mg total) by mouth every 6 (six) hours as needed for nausea or vomiting. 30 tablet 0  . pyridostigmine (MESTINON) 60 MG tablet Take 60 mg by mouth daily.    . rosuvastatin (CRESTOR) 10  MG tablet Take 5 mg by mouth daily.    . traMADol (ULTRAM) 50 MG tablet Take 50 mg by mouth every 6 (six) hours as needed (pain).     No current facility-administered medications for this visit.     Allergies:   Phenergan [promethazine hcl]; Zofran [ondansetron hcl]; and Sulfamethoxazole    Social History:  The patient  reports that he has never smoked. He has never used smokeless tobacco. He reports that he does not drink alcohol or use drugs.   Family History:  The patient's family history includes Breast cancer in his sister; Clotting disorder in his sister; Dementia in his sister; Heart attack in his father, mother, sister, sister, and sister; Heart disease in his father, mother, and sister; Stroke in his father and mother.    ROS:  Please see the history of present illness.   Otherwise, review of systems are positive for none.   All other systems are reviewed and negative.    PHYSICAL EXAM: VS:  BP (!) 148/74 (BP Location: Right Arm)   Pulse 66   Ht 5\' 7"  (1.702 m)   Wt 184 lb 6.4 oz (83.6 kg)   BMI 28.88 kg/m  , BMI Body mass index is 28.88 kg/m. GEN: Well nourished, well developed, in no acute distress  HEENT: normal  Neck: no JVD, carotid bruits, or masses Cardiac: RRR; no murmurs, rubs, or gallops,no edema  Respiratory:  clear to auscultation bilaterally, normal work of breathing GI: soft, nontender, nondistended, + BS MS: no deformity or atrophy  Skin: warm and dry, no  rash Neuro:  Strength and sensation are intact Psych: euthymic mood, full affect Vascular: Femoral pulses +1 on the right side and +2 on the left side. Distal pulses are not palpable.  EKG:  EKG is not ordered today.   Recent Labs: 07/02/2016: ALT 24; TSH 1.996 07/03/2016: Magnesium 1.8 11/10/2016: BUN 14; Creatinine, Ser 1.71; Hemoglobin 11.8; Platelets 214; Potassium 4.4; Sodium 141    Lipid Panel    Component Value Date/Time   CHOL 104 07/03/2016 0403   TRIG 91 07/03/2016 0403   HDL 33 (L) 07/03/2016 0403   CHOLHDL 3.2 07/03/2016 0403   VLDL 18 07/03/2016 0403   LDLCALC 53 07/03/2016 0403   LDLDIRECT 59.8 10/12/2012 0737      Wt Readings from Last 3 Encounters:  11/10/16 184 lb 6.4 oz (83.6 kg)  10/09/16 182 lb 12.8 oz (82.9 kg)  08/10/16 181 lb (82.1 kg)       No flowsheet data found.    ASSESSMENT AND PLAN:  1.  Peripheral arterial disease with severe bilateral leg claudication likely due to multilevel disease. The patient's exam is suggestive of inflow disease on the right side. Distal pulses are not palpable. I discussed with him the natural history and management of peripheral arterial disease. I discussed the option of attempted walking exercise program. However, he reports that he has not been able to do even short distance walking. He already has attempted that and has not been able to walk more than 25 yards without having to stop due to severe pain. Due to severity of his symptoms, I recommend proceeding with abdominal aortogram, lower extremity runoff and possible endovascular intervention. I discussed the procedure in details as well as risk and benefits. Hold Eliquis 2 days before the procedure. Start aspirin 81 mg once daily.  2. Coronary artery disease involving native coronary arteries without angina: Continue medical therapy.  3. CML: Currently on Gleevec.  4.  Hyperlipidemia: Continue treatment with rosuvastatin with a target LDL of less than  70.    Disposition:   FU with me in 1 month  Signed,  [name], MD  11/10/2016 4:40 PM    [facility]

## 2016-11-18 NOTE — Progress Notes (Addendum)
Site area: Right groin a 6 french long arterial sheath was removed  Site Prior to Removal:  Level 0  Pressure Applied For 60 MINUTES    Bedrest Beginning at 1545p  Manual:   Yes.    Patient Status During Pull:  stable  Post Pull Groin Site:  Level 1 - bruise  Post Pull Instructions Given:  Yes.    Post Pull Pulses Present:  Yes.    Dressing Applied:  Yes.  Fem stop place per MD  Comments:  VS remain stable

## 2016-11-18 NOTE — Interval H&P Note (Signed)
History and Physical Interval Note:  11/18/2016 9:43 AM  [name]  has presented today for surgery, with the diagnosis of pvd  The various methods of treatment have been discussed with the patient and family. After consideration of risks, benefits and other options for treatment, the patient has consented to  Procedure(s): ABDOMINAL AORTOGRAM W/LOWER EXTREMITY (N/A) as a surgical intervention .  The patient's history has been reviewed, patient examined, no change in status, stable for surgery.  I have reviewed the patient's chart and labs.  Questions were answered to the patient's satisfaction.     [name]

## 2016-11-19 ENCOUNTER — Other Ambulatory Visit: Payer: Self-pay | Admitting: Cardiology

## 2016-11-19 ENCOUNTER — Encounter (HOSPITAL_COMMUNITY): Payer: Self-pay | Admitting: Cardiovascular Disease

## 2016-11-19 DIAGNOSIS — Z86718 Personal history of other venous thrombosis and embolism: Secondary | ICD-10-CM | POA: Diagnosis not present

## 2016-11-19 DIAGNOSIS — I2510 Atherosclerotic heart disease of native coronary artery without angina pectoris: Secondary | ICD-10-CM | POA: Diagnosis not present

## 2016-11-19 DIAGNOSIS — I739 Peripheral vascular disease, unspecified: Secondary | ICD-10-CM

## 2016-11-19 DIAGNOSIS — Z882 Allergy status to sulfonamides status: Secondary | ICD-10-CM | POA: Diagnosis not present

## 2016-11-19 DIAGNOSIS — Z7901 Long term (current) use of anticoagulants: Secondary | ICD-10-CM | POA: Diagnosis not present

## 2016-11-19 DIAGNOSIS — Z888 Allergy status to other drugs, medicaments and biological substances status: Secondary | ICD-10-CM | POA: Diagnosis not present

## 2016-11-19 LAB — CBC
HCT: 31.6 % — ABNORMAL LOW (ref 39.0–52.0)
Hemoglobin: 10.5 g/dL — ABNORMAL LOW (ref 13.0–17.0)
MCH: 31.7 pg (ref 26.0–34.0)
MCHC: 33.2 g/dL (ref 30.0–36.0)
MCV: 95.5 fL (ref 78.0–100.0)
PLATELETS: 160 10*3/uL (ref 150–400)
RBC: 3.31 MIL/uL — ABNORMAL LOW (ref 4.22–5.81)
RDW: 14 % (ref 11.5–15.5)
WBC: 7.8 10*3/uL (ref 4.0–10.5)

## 2016-11-19 LAB — BASIC METABOLIC PANEL
ANION GAP: 7 (ref 5–15)
BUN: 13 mg/dL (ref 6–20)
CALCIUM: 8.3 mg/dL — AB (ref 8.9–10.3)
CO2: 25 mmol/L (ref 22–32)
Chloride: 105 mmol/L (ref 101–111)
Creatinine, Ser: 1.53 mg/dL — ABNORMAL HIGH (ref 0.61–1.24)
GFR calc Af Amer: 48 mL/min — ABNORMAL LOW (ref >60)
GFR, EST NON AFRICAN AMERICAN: 42 mL/min — AB (ref >60)
GLUCOSE: 115 mg/dL — AB (ref 65–99)
Potassium: 4.4 mmol/L (ref 3.5–5.1)
Sodium: 137 mmol/L (ref 135–145)

## 2016-11-19 MED ORDER — CLOPIDOGREL BISULFATE 75 MG PO TABS: 75.0000 mg | ORAL_TABLET | Freq: Every day | ORAL | 0 refills | Status: DC

## 2016-11-19 MED FILL — Lidocaine HCl Local Inj 2%: INTRAMUSCULAR | Qty: 10 | Status: AC

## 2016-11-19 MED FILL — Fentanyl Citrate Preservative Free (PF) Inj 100 MCG/2ML: INTRAMUSCULAR | Qty: 2 | Status: AC

## 2016-11-19 NOTE — Care Management Note (Signed)
Case Management Note  Patient Details  Name: [name] MRN: [id number] Date of Birth: 08-26-1940  Subjective/Objective:    From home s/p pv intervention, will be on plavix/eliquis ( was already on eliquis).                Action/Plan: NCM will follow for dc needs.   Expected Discharge Date:                  Expected Discharge Plan:  Home/Self Care  In-House Referral:     Discharge planning Services  CM Consult  Post Acute Care Choice:    Choice offered to:     DME Arranged:    DME Agency:     HH Arranged:    [facility] Agency:     Status of Service:  Completed, signed off  If discussed at [employer] of Stay Meetings, dates discussed:    Additional Comments:  Zenon Mayo, RN 11/19/2016, 9:07 AM

## 2016-11-19 NOTE — Discharge Summary (Signed)
Discharge Summary    Patient ID: [name],  MRN: [id number], DOB/AGE: 08-12-1940 76 y.o.  Admit date: 11/18/2016 Discharge date: 11/19/2016  Primary Care Provider: Renato Shin Primary Cardiologist: Cooper/Arida  Discharge Diagnoses    Active Problems:   PAD (peripheral artery disease) ([place])   Allergies Allergies  Allergen Reactions  . Phenergan [Promethazine Hcl] Swelling    Cannot be combined with Zofran because swelling of the eyes resulted and patient became very restless-  . Zofran [Ondansetron Hcl] Other (See Comments)    Cannot be combined with Phenergan because swelling of the eyes resulted and patient became very restless-  . Sulfamethoxazole Rash and Itching    Diagnostic Studies/Procedures    PV angiogram: 11/18/16  Conclusion   1. No significant aortoiliac disease. 2. Left lower extremity: Heavily calcified and occluded distal left SFA into the left proximal popliteal artery with three-vessel runoff below the knee but with significant stenosis affecting the ostial anterior tibial and diffuse disease affecting the posterior tibial artery. 3. Right lower extremity: Significant calcified eccentric stenosis affecting the common femoral artery. Moderate to borderline significant distal SFA disease, heavily calcified 60-70% distal popliteal artery disease. Three-vessel runoff below the knee with diffuse calcified disease.  4. Successful angioplasty, drug-coated balloon angioplasty and self-expanding stent placement to the left SFA extending into the proximal popliteal artery.  Recommendations: Recommend treatment with Plavix for at least one month. Eliquis can be resumed tomorrow if no bleeding complications and aspirin can be stopped. 60 mL of contrast was used. I'm going to hydrate him overnight and check renal function in the morning. If the patient is significantly symptomatic on the right side, he might require right common femoral artery  endarterectomy.  _____________   History of Present Illness     [name] is a 76 y.o. male who was referred by Dr. Burt Knack for evaluation and management of peripheral arterial disease. 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 He reported severe bilateral calf discomfort with minimal distance walking. This started about 3-6 months ago and has been worsening. He could not go to the mailbox without having to stop and rest for few minutes. This had significantly affected his ability to perform activities of daily living. He reported being active in the past and used to walk all the time for exercise. He used to be a runner in his younger age. He reported that the pain is in both calfs and slightly worse than the left side. He underwent noninvasive vascular evaluation which showed severely reduced ABI on the right side and moderately reduced on the left side. Duplex was overall suboptimal. It did show occluded left SFA. He has no rest pain or lower extremity ulceration. The patient has borderline diabetes not currently not on medications. Given his symptoms and dopplers he was referred for outpatient PV angiogram.   Hospital Course     Underwent successful angioplasty, drug-coated balloon angioplasty and self-expanding stent placement to the left SFA extending into the proximal popliteal artery. Post did developed a right groin hematoma that resolved with manual pressure. Did have significant eccymosis afterwards. Post cath labs were stable with Cr 1.53 and Hgb 10.5. He was able to ambulate without any difficultly. Plan for plavix and Eliquis for one month then stop plavix, with continuation of Eliquis. Instructed to restart eliquis tomorrow.   [name] was seen by Dr. Gwenlyn Found and determined stable for discharge  home. Follow up in the office has been arranged. Medications are listed below.    _____________  Discharge Vitals Blood pressure 140/80, pulse 65, temperature 98.5 F (36.9 C), temperature source Oral, resp. rate 16, height 5\' 7"  (1.702 m), weight 180 lb 12.4 oz (82 kg), SpO2 98 %.  Filed Weights   11/18/16 0619 11/19/16 0343  Weight: 180 lb (81.6 kg) 180 lb 12.4 oz (82 kg)    Labs & Radiologic Studies    CBC  Recent Labs  11/19/16 1247  WBC 7.8  HGB 10.5*  HCT 31.6*  MCV 95.5  PLT 540   Basic Metabolic Panel  Recent Labs  11/19/16 0416  NA 137  K 4.4  CL 105  CO2 25  GLUCOSE 115*  BUN 13  CREATININE 1.53*  CALCIUM 8.3*   Liver Function Tests No results for input(s): AST, ALT, ALKPHOS, BILITOT, PROT, ALBUMIN in the last 72 hours. No results for input(s): LIPASE, AMYLASE in the last 72 hours. Cardiac Enzymes No results for input(s): CKTOTAL, CKMB, CKMBINDEX, TROPONINI in the last 72 hours. BNP Invalid input(s): POCBNP D-Dimer No results for input(s): DDIMER in the last 72 hours. Hemoglobin A1C No results for input(s): HGBA1C in the last 72 hours. Fasting Lipid Panel No results for input(s): CHOL, HDL, LDLCALC, TRIG, CHOLHDL, LDLDIRECT in the last 72 hours. Thyroid Function Tests No results for input(s): TSH, T4TOTAL, T3FREE, THYROIDAB in the last 72 hours.  Invalid input(s): FREET3 _____________  No results found. Disposition   Pt is being discharged home today in good condition.  Follow-up Plans & Appointments    Follow-up Information    [facility] Follow up on 12/03/2016.   Specialty:  Cardiology Why:  at 2:30pm for your follow up dopplers.  Contact information: [address] [place] [phone]       [name], MD Follow up on 12/29/2016.   Specialty:  Cardiology Why:  at 9am for your follow up appt.  Contact information: [address] [place] [address] [phone]          Discharge Instructions    Call MD for:  redness,  tenderness, or signs of infection (pain, swelling, redness, odor or green/yellow discharge around incision site)    Complete by:  As directed    Diet - low sodium heart healthy    Complete by:  As directed    Discharge instructions    Complete by:  As directed    Groin Site Care Refer to this sheet in the next few weeks. These instructions provide you with information on caring for yourself after your procedure. Your caregiver may also give you more specific instructions. Your treatment has been planned according to current medical practices, but problems sometimes occur. Call your caregiver if you have any problems or questions after your procedure. HOME CARE INSTRUCTIONS You may shower 24 hours after the procedure. Remove the bandage (dressing) and gently wash the site with plain soap and water. Gently pat the site dry.  Do not apply powder or lotion to the site.  Do not sit in a bathtub, swimming pool, or whirlpool for 5 to 7 days.  No bending, squatting, or lifting anything over 10 pounds (4.5 kg) as directed by your caregiver.  Inspect the site at least twice daily.  Do not drive home if you are discharged the same day of the procedure. Have someone else drive you.  You may drive 24 hours after the  procedure unless otherwise instructed by your caregiver.  What to expect: Any bruising will usually fade within 1 to 2 weeks.  Blood that collects in the tissue (hematoma) may be painful to the touch. It should usually decrease in size and tenderness within 1 to 2 weeks.  SEEK IMMEDIATE MEDICAL CARE IF: You have unusual pain at the groin site or down the affected leg.  You have redness, warmth, swelling, or pain at the groin site.  You have drainage (other than a small amount of blood on the dressing).  You have chills.  You have a fever or persistent symptoms for more than 72 hours.  You have a fever and your symptoms suddenly get worse.  Your leg becomes pale, cool, tingly, or numb.  You  have heavy bleeding from the site. Hold pressure on the site.   You will be on plavix/eliquis for one month. Then plan to stop plavix and continue your eliquis. Please do not restart your eliquis until tomorrow. Please call the office with any questions.   Increase activity slowly    Complete by:  As directed       Discharge Medications     Medication List    STOP taking these medications   aspirin EC 81 MG tablet     TAKE these medications   alum & mag hydroxide-simeth 200-200-20 MG/5ML suspension Commonly known as:  MAALOX/MYLANTA Take 15 mLs by mouth every 6 (six) hours as needed for indigestion or heartburn.   cholecalciferol 1000 units tablet Commonly known as:  VITAMIN D Take 1,000 Units by mouth daily.   clopidogrel 75 MG tablet Commonly known as:  PLAVIX Take 1 tablet (75 mg total) by mouth daily with breakfast.   COMBIGAN 0.2-0.5 % ophthalmic solution Generic drug:  brimonidine-timolol Place 1 drop into both eyes every 12 (twelve) hours.   ELIQUIS 5 MG Tabs tablet Generic drug:  apixaban TAKE 1 TABLET (5 MG TOTAL) BY MOUTH 2 (TWO) TIMES DAILY. Notes to patient:  Please do not restart until 11/20/16.    esomeprazole 40 MG capsule Commonly known as:  NEXIUM Take 40 mg by mouth daily as needed (indigestion).   furosemide 20 MG tablet Commonly known as:  LASIX TAKE 1 TABLET BY MOUTH DAILY.   imatinib 100 MG tablet Commonly known as:  GLEEVEC Take 300 mg by mouth daily at 12 noon. Take with meals and large glass of water.Caution:Chemotherapy   isosorbide mononitrate 30 MG 24 hr tablet Commonly known as:  IMDUR Take 1 tablet (30 mg total) by mouth daily.   lidocaine 5 % Commonly known as:  LIDODERM Place 1 patch onto the skin daily as needed (pain). Remove & Discard patch within 12 hours or as directed by MD   lisinopril 5 MG tablet Commonly known as:  PRINIVIL,ZESTRIL Take 1 tablet (5 mg total) by mouth daily.   loperamide 2 MG capsule Commonly  known as:  IMODIUM Take 2-4 mg by mouth 3 (three) times daily as needed for diarrhea or loose stools (takes 1-2 tablets every and additional if neded). For diarrhea   LORazepam 0.5 MG tablet Commonly known as:  ATIVAN Take 0.5 mg by mouth at bedtime as needed for sleep.   Melatonin 3 MG Tabs Take 3 mg by mouth at bedtime as needed (for sleep).   multivitamin tablet Take 1 tablet by mouth daily.   NITROSTAT 0.4 MG SL tablet Generic drug:  nitroGLYCERIN PLACE 1 TABLET UNDER THE TONGUE EVERY 5 MINUTES X3 DOSES AS  NEEDED FOR CHEST PAIN   ondansetron 8 MG disintegrating tablet Commonly known as:  ZOFRAN ODT Take 1 tablet (8 mg total) by mouth every 8 (eight) hours as needed for nausea or vomiting.   predniSONE 5 MG tablet Commonly known as:  DELTASONE TAKE 1 TABLET BY MOUTH DAILY.   promethazine 12.5 MG tablet Commonly known as:  PHENERGAN Take 1 tablet (12.5 mg total) by mouth every 6 (six) hours as needed for nausea or vomiting.   pyridostigmine 60 MG tablet Commonly known as:  MESTINON Take 60 mg by mouth daily.   rosuvastatin 10 MG tablet Commonly known as:  CRESTOR Take 5 mg by mouth daily.   traMADol 50 MG tablet Commonly known as:  ULTRAM Take 50 mg by mouth every 6 (six) hours as needed (pain).         Outstanding Labs/Studies   Follow up dopplers  Duration of Discharge Encounter   Greater than 30 minutes including physician time.  Signed, Reino Bellis NP-C 11/19/2016, 2:01 PM  Agree with note by Reino Bellis NP-C  Patient of Dr. Fletcher Anon who underwent distal left SFA/above-the-knee popliteal percutaneous intervention using CO2 and contrast with minimal dye. This was done because of claudication. His renal function has remained stable. His groin is stable as well. He'll be discharged home this morning. Outpatient Doppler studies and outpatient follow-up will be arranged with Dr. Fletcher Anon.  Lorretta Harp, M.D., [place], [facility], [place] [address] [place], [place]  [address]  [phone] 11/20/2016 7:37 AM

## 2016-11-22 ENCOUNTER — Telehealth: Payer: Self-pay | Admitting: Physician Assistant

## 2016-11-22 NOTE — Telephone Encounter (Signed)
[name] is a 76 y.o. male who underwent left SFA stenting by Dr. Fletcher Anon on 11/18/16.  He called the answering service today with complaints of numbness and tingling in his left leg from the knee down.  He noted this when he awoke this morning.  The symptoms are quite mild.  He denies any pain, coolness of his extremity or pale appearance.  His groin site is stable.  He otherwise feels well.  He denies any further claudication. PLAN:  1.  Monitor symptoms throughout the day 2.  If symptoms intensify or he develops pain, coolness, pale appearance, go to the emergency room 3.  I will send a message to have the patient brought in for an appointment tomorrow, 11/23/16. Richardson Dopp, PA-C    11/22/2016 8:36 AM

## 2016-11-23 ENCOUNTER — Other Ambulatory Visit: Payer: Self-pay | Admitting: Cardiology

## 2016-11-23 ENCOUNTER — Encounter: Payer: Self-pay | Admitting: Endocrinology

## 2016-11-23 ENCOUNTER — Ambulatory Visit: Payer: Medicare Other | Admitting: Endocrinology

## 2016-11-23 DIAGNOSIS — Z23 Encounter for immunization: Secondary | ICD-10-CM

## 2016-11-23 DIAGNOSIS — I739 Peripheral vascular disease, unspecified: Secondary | ICD-10-CM

## 2016-11-23 NOTE — Telephone Encounter (Signed)
There are no available appointments at the [facility] or [facility] I called Alysia Penna, RN at our [employer] office to ask about availability for an appointment today and she states they will get the patient scheduled. I advised that I will forward message to her to call the patient. I thanked her for her help.

## 2016-11-23 NOTE — Telephone Encounter (Signed)
Message to Dr. Fletcher Anon. Awaiting return reply

## 2016-11-23 NOTE — Telephone Encounter (Signed)
S/w pt who is aware of LEA 10/16, 12:30 and will await results before determining if a sooner f/u appt w/Dr. Fletcher Anon is needed.

## 2016-11-23 NOTE — Telephone Encounter (Signed)
I s/w pt. He reports sx are better this morning. Numbness and tingling is less but still present.   Denies pain, coolness, or discoloration of the left lower extremity.  Dr. Fletcher Anon requests to expedite LEA with a follow up office visit on Thursday. I have routed to Christen Bame, RN as the LEA is scheduled in [place]. Pt is available today and tomorrow. I will schedule f/u based on the new date of the LEA.

## 2016-11-24 ENCOUNTER — Ambulatory Visit (HOSPITAL_COMMUNITY)
Admission: RE | Admit: 2016-11-24 | Discharge: 2016-11-24 | Disposition: A | Discharge status: Home / Self Care | Payer: Medicare Other | Source: Ambulatory Visit | Attending: Cardiology | Admitting: Cardiology

## 2016-11-24 DIAGNOSIS — I70202 Unspecified atherosclerosis of native arteries of extremities, left leg: Secondary | ICD-10-CM | POA: Insufficient documentation

## 2016-11-24 DIAGNOSIS — E785 Hyperlipidemia, unspecified: Secondary | ICD-10-CM | POA: Diagnosis not present

## 2016-11-24 DIAGNOSIS — Z8673 Personal history of transient ischemic attack (TIA), and cerebral infarction without residual deficits: Secondary | ICD-10-CM | POA: Insufficient documentation

## 2016-11-24 DIAGNOSIS — I10 Essential (primary) hypertension: Secondary | ICD-10-CM | POA: Insufficient documentation

## 2016-11-24 DIAGNOSIS — E1151 Type 2 diabetes mellitus with diabetic peripheral angiopathy without gangrene: Secondary | ICD-10-CM | POA: Insufficient documentation

## 2016-11-24 DIAGNOSIS — I739 Peripheral vascular disease, unspecified: Secondary | ICD-10-CM | POA: Diagnosis not present

## 2016-11-24 DIAGNOSIS — I2510 Atherosclerotic heart disease of native coronary artery without angina pectoris: Secondary | ICD-10-CM | POA: Diagnosis not present

## 2016-11-26 ENCOUNTER — Telehealth: Payer: Self-pay | Admitting: Cardiovascular Disease

## 2016-11-26 NOTE — Telephone Encounter (Signed)
Left message on machine for patient to contact the office if he had any questions regarding test results or follow up appointment.

## 2016-11-26 NOTE — Telephone Encounter (Signed)
Patient called back to confirm he received message regarding LEA results and follow up plan. He reports left leg bruising but tingling and numbness if getting better each day. Pt on Eliquis 5mg  and denies any blood in urine or stool; no nose bleeds. He will continue to monitor sx and call if they worsen. He is agreeable to keep his Nov appt w/Dr. Fletcher Anon at the [facility] office.

## 2016-11-26 NOTE — Telephone Encounter (Signed)
Patient returning call from [name].  °

## 2016-12-03 ENCOUNTER — Encounter (HOSPITAL_COMMUNITY): Payer: Medicare Other

## 2016-12-10 ENCOUNTER — Encounter: Payer: Self-pay | Admitting: Neurology

## 2016-12-10 ENCOUNTER — Ambulatory Visit (INDEPENDENT_AMBULATORY_CARE_PROVIDER_SITE_OTHER): Payer: Medicare Other | Admitting: Neurology

## 2016-12-10 VITALS — BP 124/68 | HR 56 | Ht 67.0 in | Wt 185.5 lb

## 2016-12-10 DIAGNOSIS — G7000 Myasthenia gravis without (acute) exacerbation: Secondary | ICD-10-CM

## 2016-12-10 DIAGNOSIS — I6523 Occlusion and stenosis of bilateral carotid arteries: Secondary | ICD-10-CM

## 2016-12-10 MED ORDER — PREDNISONE 1 MG PO TABS: 1.0000 mg | ORAL_TABLET | Freq: Every day | ORAL | 1 refills | Status: DC

## 2016-12-10 MED ORDER — PREDNISONE 1 MG PO TABS: 4.0000 mg | ORAL_TABLET | Freq: Every day | ORAL | 1 refills | Status: DC

## 2016-12-10 NOTE — Progress Notes (Signed)
Reason for visit: Myasthenia gravis  [name] is an 76 y.o. male  History of present illness:  [name] is a 76 year old right-handed white male with a history of myasthenia gravis primarily with ocular features.  He takes low-dose Mestinon, only 60 mg daily.  He is on prednisone 5 mg daily.  He has not had any problems whatsoever with double vision recently.  He had a transient 5-10-second episode of double vision in May 2018 while in church, he was admitted to the hospital for a TIA workup.  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  The patient has not had any further events.  With the episode of double vision he did not report any other symptoms such as headache, vertigo, slurred speech, numbness or weakness of the extremities, or change in balance.  He denies any significant problems with ptosis associated with his myasthenia, he does not report any weakness of the extremities.  He has had a stent placement for peripheral vascular disease in the left leg, he has a blockage on the right that will require surgery but he does not wish to undergo this procedure.  The patient has claudication involving both legs, he can walk only about 50 yards without symptoms.  The patient has had improved symptoms on the left leg following the stent placement.  He returns for an evaluation.  Past Medical History:  Diagnosis Date  . Bruises easily    d/t being on Eliquis  . Carotid artery disease ([place])    a. Duplex 05/2014: 78-93% RICA, 8-10% LICA, elevated velocities in right subclavian artery, normal left subclavian artery..  . Chronic back pain    HNP  . Chronic kidney disease (CKD)   . Claustrophobia    takes Ativan as needed  . 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 Coronary atherosclerosis of unspecified type of vessel, native or graft    a. cath  in 2003 showed 10-20% LM, scattered 20% prox-mid LAD, 30% more distal LAD, 50-60% stenosis of LAD towards apex, 20% Cx, 30% dRCA, focal 95% stenosis in smaller of 2 branches of PDA, EF 60-65%.  . Diabetes mellitus ([place])   . Diarrhea    takes Imodium daily as needed  . Diverticulosis   . Diverticulosis of colon (without mention of hemorrhage)   . DJD (degenerative joint disease)   . DVT (deep venous thrombosis) ([place]) 07/2011  . Esophageal stricture   . Essential hypertension    takes Imdur and Lisinopril daily  . Gastric polyps    benign  . GERD (gastroesophageal reflux disease)    takes Nexium daily  . Glaucoma    uses eye drops  . History of bronchitis    not sure when the last time  . History of colon polyps    benign  . History of kidney stones   . History of kidney stones   . Insomnia    takes Melatonin nightly as needed  . Mixed hyperlipidemia    takes Crestor daily  . Myasthenia gravis ([place])    uses prednisone  . Osteoporosis 2016  . Peripheral edema    takes Lasix daily  . Pituitary tumor    checks Dr[facility]Walden at [facility] checks it every Jan   . Pneumonia 01/2016  . PONV (postoperative nausea and vomiting)   . Ptosis   .  Pulmonary embolus ([place]) 2012  . Stroke [facility]) 06/2015    Past Surgical History:  Procedure Laterality Date  . ABDOMINAL AORTOGRAM W/LOWER EXTREMITY N/A 11/18/2016   Procedure: ABDOMINAL AORTOGRAM W/LOWER EXTREMITY;  Surgeon: [name], MD;  Location: [place] [facility];  Service: Cardiovascular;  Laterality: N/A;  . BACK SURGERY     x2, lumbar and cervical  . CARDIAC CATHETERIZATION    . CATARACT EXTRACTION Bilateral 12/12  . COLONOSCOPY    . ESOPHAGOGASTRODUODENOSCOPY ENDOSCOPY  04/2015  . IR GENERIC HISTORICAL  03/03/2016   IR IVC FILTER PLMT / S&I [name] GUID/MOD SED 03/03/2016 [name], MD [facility]INTER[facility] RAD  . IR GENERIC HISTORICAL  04/09/2016   IR RADIOLOGIST EVAL & MGMT 04/09/2016 [name], MD [facility] INTER[facility] RAD  . IR GENERIC  HISTORICAL  04/30/2016   IR IVC FILTER RETRIEVAL / S&I [name] GUID/MOD SED 04/30/2016 [name], MD [facility]-INTERV RAD  . LITHOTRIPSY    . LUMBAR LAMINECTOMY/DECOMPRESSION MICRODISCECTOMY Right 09/14/2012   Procedure: Right Lumbar three-four, four-five, Lumbar five-Sacral one decompressive laminectomy;  Surgeon: [name], MD;  Location: [place] [facility];  Service: Neurosurgery;  Laterality: Right;  . LUMBAR LAMINECTOMY/DECOMPRESSION MICRODISCECTOMY Left 03/11/2016   Procedure: Left Lumbar Two-ThreeMicrodiscectomy;  Surgeon: [name], MD;  Location: [place];  Service: Neurosurgery;  Laterality: Left;  . PERIPHERAL VASCULAR INTERVENTION  11/18/2016   Procedure: PERIPHERAL VASCULAR INTERVENTION;  Surgeon: [name], MD;  Location: [place] [facility];  Service: Cardiovascular;;  Left popliteal  . SHOULDER SURGERY Left 05/07/2009  . TONSILLECTOMY      Family History  Problem Relation Age of Onset  . Heart disease Mother   . Heart attack Mother   . Stroke Mother   . Heart disease Father   . Heart attack Father   . Stroke Father   . Breast cancer Sister        Twin   . Heart attack Sister   . Dementia Sister   . Heart disease Sister   . Heart attack Sister   . Clotting disorder Sister   . Heart attack Sister   . Colon cancer Neg Hx     Social history:  reports that he has never smoked. He has never used smokeless tobacco. He reports that he does not drink alcohol or use drugs.    Allergies  Allergen Reactions  . Phenergan [Promethazine Hcl] Swelling    Cannot be combined with Zofran because swelling of the eyes resulted and patient became very restless-  . Zofran [Ondansetron Hcl] Other (See Comments)    Cannot be combined with Phenergan because swelling of the eyes resulted and patient became very restless-  . Sulfamethoxazole Rash and Itching    Medications:  Prior to Admission medications   Medication Sig Start Date End Date Taking? Authorizing Provider  alum & mag  hydroxide-simeth (MAALOX/MYLANTA) 200-200-20 MG/5ML suspension Take 15 mLs by mouth every 6 (six) hours as needed for indigestion or heartburn.   Yes [provider]  brimonidine-timolol (COMBIGAN) 0.2-0.5 % ophthalmic solution Place 1 drop into both eyes every 12 (twelve) hours.   Yes [provider]  cholecalciferol (VITAMIN D) 1000 units tablet Take 1,000 Units by mouth daily.    Yes [provider]  ELIQUIS 5 MG TABS tablet TAKE 1 TABLET (5 MG TOTAL) BY MOUTH 2 (TWO) TIMES DAILY. 12/09/15  Yes Kathrynn Ducking, MD  esomeprazole (NEXIUM) 40 MG capsule Take 40 mg by mouth daily as needed (indigestion).  Yes [provider]  furosemide (LASIX) 20 MG tablet TAKE 1 TABLET BY MOUTH DAILY. 12/23/15  Yes Sherren Mocha, MD  imatinib (GLEEVEC) 100 MG tablet Take 300 mg by mouth daily at 12 noon. Take with meals and large glass of water.Caution:Chemotherapy    Yes [provider]  isosorbide mononitrate (IMDUR) 30 MG 24 hr tablet Take 1 tablet (30 mg total) by mouth daily. 09/17/15  Yes Sherren Mocha, MD  lidocaine (LIDODERM) 5 % Place 1 patch onto the skin daily as needed (pain). Remove & Discard patch within 12 hours or as directed by MD   Yes [provider]  lisinopril (PRINIVIL,ZESTRIL) 5 MG tablet Take 1 tablet (5 mg total) by mouth daily. 11/05/15  Yes Sherren Mocha, MD  loperamide (IMODIUM) 2 MG capsule Take 2-4 mg by mouth 3 (three) times daily as needed for diarrhea or loose stools (takes 1-2 tablets every and additional if neded). For diarrhea   Yes [provider]  LORazepam (ATIVAN) 0.5 MG tablet Take 0.5 mg by mouth at bedtime as needed for sleep.   Yes [provider]  Melatonin 3 MG TABS Take 3 mg by mouth at bedtime as needed (for sleep).    Yes [provider]  Multiple Vitamin (MULTIVITAMIN) tablet Take 1 tablet by mouth daily.   Yes [provider]  ondansetron (ZOFRAN ODT) 8 MG disintegrating  tablet Take 1 tablet (8 mg total) by mouth every 8 (eight) hours as needed for nausea or vomiting. 05/07/16  Yes Armbruster, Carlota Raspberry, MD  predniSONE (DELTASONE) 5 MG tablet TAKE 1 TABLET BY MOUTH DAILY. 09/21/16  Yes Kathrynn Ducking, MD  promethazine (PHENERGAN) 12.5 MG tablet Take 1 tablet (12.5 mg total) by mouth every 6 (six) hours as needed for nausea or vomiting. 06/28/15  Yes Love, Ivan Anchors, PA-C  pyridostigmine (MESTINON) 60 MG tablet Take 60 mg by mouth daily.   Yes [provider]  rosuvastatin (CRESTOR) 10 MG tablet Take 5 mg by mouth daily.   Yes [provider]  traMADol (ULTRAM) 50 MG tablet Take 50 mg by mouth every 6 (six) hours as needed (pain).   Yes [provider]  NITROSTAT 0.4 MG SL tablet PLACE 1 TABLET UNDER THE TONGUE EVERY 5 MINUTES X3 DOSES AS NEEDED FOR CHEST PAIN Patient not taking: Reported on 12/10/2016 11/19/15   Sherren Mocha, MD    ROS:  Out of a complete 14 system review of symptoms, the patient complains only of the following symptoms, and all other reviewed systems are negative.  Leg pain with walking Double vision  Blood pressure 124/68, pulse (!) 56, height 5\' 7"  (1.702 m), weight 185 lb 8 oz (84.1 kg).  Physical Exam  General: The patient is alert and cooperative at the time of the examination.  Skin: No significant peripheral edema is noted.   Neurologic Exam  Mental status: The patient is alert and oriented x 3 at the time of the examination. The patient has apparent normal recent and remote memory, with an apparently normal attention span and concentration ability.   Cranial nerves: Facial symmetry is present. Speech is normal, no aphasia or dysarthria is noted. Extraocular movements are full. Visual fields are full.  Mild ptosis is seen bilaterally.  With superior gaze for 1 minute, no increase in ptosis or divergence of gaze or subjective double vision was noted.  Motor: The patient has good strength in all 4  extremities.  With the arms outstretched 1 minute, no  fatigable weakness of the deltoid muscles was noted.  Sensory examination: Soft touch sensation is symmetric on the face, arms, and legs.  Coordination: The patient has good finger-nose-finger and heel-to-shin bilaterally.  Gait and station: The patient has a normal gait. Tandem gait is slightly unsteady. Romberg is negative. No drift is seen.  Reflexes: Deep tendon reflexes are symmetric.   MRI brain 07/02/16:  IMPRESSION: 1. No acute intracranial process identified. 2. 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 3. Moderate chronic microvascular ischemic disease. 4. Moderate right maxillary sinusitis.  * MRI scan images were reviewed online. I agree with the written report.    CTA head and neck 07/03/16:  IMPRESSION: Chronic white matter ischemia. Chronic left PI CA infarct. No acute intracranial abnormality.  Ulcerated atherosclerotic plaque right internal carotid artery without significant stenosis. This is a potential source of emboli.  Mild atherosclerotic calcification left carotid bifurcation without stenosis  Occlusion of the left vertebral artery which reconstitutes at the C1 level.  No significant intracranial stenosis or occlusion.   Assessment/Plan:  1.  Myasthenia gravis  2.  Possible TIA in May 2018  3.  Peripheral vascular disease  The patient is doing fairly well with the myasthenia gravis, we will reduce the prednisone dose to 4 mg daily for 3 months and then go to 3 mg daily.  The patient will contact our office if his symptoms return.  He will follow-up in 6 months otherwise.  A prescription was sent in for the 1 mg tablets of prednisone.  Jill Alexanders MD 12/10/2016 10:07 AM  [facility] [address] [place], [place] [address]  Phone [phone] Fax [phone]

## 2016-12-10 NOTE — Patient Instructions (Signed)
   Reduce the prednisone to 4 mg a day for 3 months, and if you are doing well, reduce to 3 mg a day.

## 2016-12-17 ENCOUNTER — Other Ambulatory Visit: Payer: Self-pay | Admitting: Cardiovascular Disease

## 2016-12-17 DIAGNOSIS — I739 Peripheral vascular disease, unspecified: Secondary | ICD-10-CM

## 2016-12-29 ENCOUNTER — Ambulatory Visit (INDEPENDENT_AMBULATORY_CARE_PROVIDER_SITE_OTHER): Payer: Medicare Other | Admitting: Cardiovascular Disease

## 2016-12-29 ENCOUNTER — Encounter: Payer: Self-pay | Admitting: Cardiovascular Disease

## 2016-12-29 ENCOUNTER — Encounter: Payer: Self-pay | Admitting: *Deleted

## 2016-12-29 VITALS — BP 122/62 | HR 62 | Ht 67.0 in | Wt 184.0 lb

## 2016-12-29 DIAGNOSIS — G249 Dystonia, unspecified: Secondary | ICD-10-CM | POA: Insufficient documentation

## 2016-12-29 DIAGNOSIS — E785 Hyperlipidemia, unspecified: Secondary | ICD-10-CM | POA: Diagnosis not present

## 2016-12-29 DIAGNOSIS — I2510 Atherosclerotic heart disease of native coronary artery without angina pectoris: Secondary | ICD-10-CM | POA: Diagnosis not present

## 2016-12-29 DIAGNOSIS — I739 Peripheral vascular disease, unspecified: Secondary | ICD-10-CM | POA: Diagnosis not present

## 2016-12-29 DIAGNOSIS — I6523 Occlusion and stenosis of bilateral carotid arteries: Secondary | ICD-10-CM | POA: Diagnosis not present

## 2016-12-29 NOTE — Patient Instructions (Signed)
Your physician recommends that you schedule a follow-up appointment in: [place]  If you need a refill on your cardiac medications before your next appointment, please call your pharmacy.

## 2016-12-29 NOTE — Progress Notes (Signed)
Cardiology Office Note   Date:  12/30/2016   ID:  [name], DOB September 04, 1940, MRN [id number]  PCP:  Renato Shin, MD  Cardiologist:  Dr. Burt Knack  No chief complaint on file.     History of Present Illness: [name] is a 76 y.o. male who is here today for a follow-up visit regarding peripheral arterial disease.  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 He was seen for severe bilateral calf discomfort with minimal distance walking. Noninvasive vascular evaluation showed severely reduced ABI on the right side and moderately reduced on the left side. Duplex was overall suboptimal. It did show occluded left SFA. He has no rest pain or lower extremity ulceration. I proceeded with CO2 angiography which showed no significant aortoiliac disease.  There was heavily calcified occluded distal left SFA into the proximal left popliteal artery with three-vessel runoff below the knee.  This was treated with drug-coated balloon angioplasty and self-expanding stent placement due to flow-limiting dissection. On the right side, there was a calcified eccentric stenosis affecting the common femoral artery with borderline significant disease affecting the distal SFA and distal popliteal arteries. The patient had difficult to control bleeding from the right groin after sheath pull.  He was discharged home next day.  He reports resolution of left calf claudication.  He continues to have right leg claudication but not as significant as before the procedure. Postprocedure ABI surprisingly showed improvement on the right side to normal and stable on the left side.    Past Medical History:  Diagnosis Date  . Bruises easily    d/t being on Eliquis  . Carotid artery disease ([place])    a. Duplex 05/2014: 28-31% RICA, 5-17% LICA, elevated velocities in right subclavian artery, normal left subclavian artery..  .  Chronic back pain    HNP  . Chronic kidney disease (CKD)   . Claustrophobia    takes Ativan as needed  . 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 Coronary atherosclerosis of unspecified type of vessel, native or graft    a. cath in 2003 showed 10-20% LM, scattered 20% prox-mid LAD, 30% more distal LAD, 50-60% stenosis of LAD towards apex, 20% Cx, 30% dRCA, focal 95% stenosis in smaller of 2 branches of PDA, EF 60-65%.  . Diabetes mellitus ([place])   . Diarrhea    takes Imodium daily as needed  . Diverticulosis   . Diverticulosis of colon (without mention of hemorrhage)   . DJD (degenerative joint disease)   . DVT (deep venous thrombosis) ([place]) 07/2011  . Esophageal stricture   . Essential hypertension    takes Imdur and Lisinopril daily  . Gastric polyps    benign  . GERD (gastroesophageal reflux disease)    takes Nexium daily  . Glaucoma    uses eye drops  . History of bronchitis    not sure when the last time  . History of colon polyps    benign  . History of kidney stones   . History of kidney stones   . Insomnia    takes Melatonin nightly as needed  . Mixed hyperlipidemia    takes Crestor daily  . Myasthenia gravis ([place])    uses prednisone  . Osteoporosis 2016  . Peripheral edema    takes Lasix daily  . Pituitary tumor    [facility]Walden [facility] checks it every Jan   .  Pneumonia 01/2016  . PONV (postoperative nausea and vomiting)   . Ptosis   . Pulmonary embolus ([place]) 2012  . Stroke [facility]) 06/2015    Past Surgical History:  Procedure Laterality Date  . ABDOMINAL AORTOGRAM W/LOWER EXTREMITY N/A 11/18/2016   Procedure: ABDOMINAL AORTOGRAM W/LOWER EXTREMITY;  Surgeon: [name], MD;  Location: [place] [facility];  Service: Cardiovascular;  Laterality: N/A;  . BACK SURGERY     x2, lumbar and cervical  . CARDIAC CATHETERIZATION    . CATARACT EXTRACTION Bilateral 12/12  . COLONOSCOPY    . ESOPHAGOGASTRODUODENOSCOPY ENDOSCOPY   04/2015  . IR GENERIC HISTORICAL  03/03/2016   IR IVC FILTER PLMT / S&I [name] GUID/MOD SED 03/03/2016 [name], MD [facility]INTER[facility] RAD  . IR GENERIC HISTORICAL  04/09/2016   IR RADIOLOGIST EVAL & MGMT 04/09/2016 [name], MD [facility] INTER[facility] RAD  . IR GENERIC HISTORICAL  04/30/2016   IR IVC FILTER RETRIEVAL / S&I [name] GUID/MOD SED 04/30/2016 [name], MD [facility]-INTER[facility]  . LITHOTRIPSY    . LUMBAR LAMINECTOMY/DECOMPRESSION MICRODISCECTOMY Right 09/14/2012   Procedure: Right Lumbar three-four, four-five, Lumbar five-Sacral one decompressive laminectomy;  Surgeon: [name], MD;  Location: [place] [facility];  Service: Neurosurgery;  Laterality: Right;  . LUMBAR LAMINECTOMY/DECOMPRESSION MICRODISCECTOMY Left 03/11/2016   Procedure: Left Lumbar Two-ThreeMicrodiscectomy;  Surgeon: [name], MD;  Location: [place];  Service: Neurosurgery;  Laterality: Left;  . PERIPHERAL VASCULAR INTERVENTION  11/18/2016   Procedure: PERIPHERAL VASCULAR INTERVENTION;  Surgeon: [name], MD;  Location: [place] [facility];  Service: Cardiovascular;;  Left popliteal  . SHOULDER SURGERY Left 05/07/2009  . TONSILLECTOMY       Current Outpatient Medications  Medication Sig Dispense Refill  . alum & mag hydroxide-simeth (MAALOX/MYLANTA) 200-200-20 MG/5ML suspension Take 15 mLs by mouth every 6 (six) hours as needed for indigestion or heartburn.    . brimonidine-timolol (COMBIGAN) 0.2-0.5 % ophthalmic solution Place 1 drop into both eyes every 12 (twelve) hours.    . cholecalciferol (VITAMIN D) 1000 units tablet Take 1,000 Units by mouth daily.     Marland Kitchen ELIQUIS 5 MG TABS tablet TAKE 1 TABLET (5 MG TOTAL) BY MOUTH 2 (TWO) TIMES DAILY. 60 tablet 3  . furosemide (LASIX) 20 MG tablet TAKE 1 TABLET BY MOUTH DAILY. 90 tablet 2  . imatinib (GLEEVEC) 100 MG tablet Take 300 mg by mouth daily at 12 noon. Take with meals and large glass of water.Caution:Chemotherapy     . isosorbide mononitrate (IMDUR) 30 MG 24 hr tablet Take 1  tablet (30 mg total) by mouth daily. 90 tablet 3  . lidocaine (LIDODERM) 5 % Place 1 patch onto the skin daily as needed (pain). Remove & Discard patch within 12 hours or as directed by MD    . lisinopril (PRINIVIL,ZESTRIL) 5 MG tablet Take 1 tablet (5 mg total) by mouth daily. 30 tablet 11  . loperamide (IMODIUM) 2 MG capsule Take 2-4 mg by mouth 3 (three) times daily as needed for diarrhea or loose stools (takes 1-2 tablets every and additional if neded). For diarrhea    . LORazepam (ATIVAN) 0.5 MG tablet Take 0.5 mg by mouth at bedtime as needed for sleep.    . Melatonin 3 MG TABS Take 3 mg by mouth at bedtime as needed (for sleep).     . Multiple Vitamin (MULTIVITAMIN) tablet Take 1 tablet by mouth daily.    Marland Kitchen NITROSTAT 0.4 MG SL tablet PLACE 1 TABLET UNDER THE TONGUE  EVERY 5 MINUTES X3 DOSES AS NEEDED FOR CHEST PAIN 100 tablet 1  . ondansetron (ZOFRAN ODT) 8 MG disintegrating tablet Take 1 tablet (8 mg total) by mouth every 8 (eight) hours as needed for nausea or vomiting. 90 tablet 1  . predniSONE (DELTASONE) 1 MG tablet Take 4 tablets (4 mg total) by mouth daily with breakfast. 360 tablet 1  . promethazine (PHENERGAN) 12.5 MG tablet Take 1 tablet (12.5 mg total) by mouth every 6 (six) hours as needed for nausea or vomiting. 30 tablet 0  . pyridostigmine (MESTINON) 60 MG tablet Take 60 mg by mouth daily.    . rosuvastatin (CRESTOR) 10 MG tablet Take 5 mg by mouth daily.    . traMADol (ULTRAM) 50 MG tablet Take 50 mg by mouth every 6 (six) hours as needed (pain).    . ranitidine (ZANTAC) 150 MG tablet Take 1 tablet (150 mg total) by mouth 2 (two) times daily. Take 1 to 2 tablets by mouth as needed 90 tablet 3   No current facility-administered medications for this visit.     Allergies:   Phenergan [promethazine hcl]; Zofran [ondansetron hcl]; and Sulfamethoxazole    Social History:  The patient  reports that  has never smoked. he has never used smokeless tobacco. He reports that he does  not drink alcohol or use drugs.   Family History:  The patient's family history includes Breast cancer in his sister; Clotting disorder in his sister; Dementia in his sister; Heart attack in his father, mother, sister, sister, and sister; Heart disease in his father, mother, and sister; Stroke in his father and mother.    ROS:  Please see the history of present illness.   Otherwise, review of systems are positive for none.   All other systems are reviewed and negative.    PHYSICAL EXAM: VS:  BP 122/62   Pulse 62   Ht 5\' 7"  (1.702 m)   Wt 184 lb (83.5 kg)   SpO2 97%   BMI 28.82 kg/m  , BMI Body mass index is 28.82 kg/m. GEN: Well nourished, well developed, in no acute distress  HEENT: normal  Neck: no JVD, carotid bruits, or masses Cardiac: RRR; no murmurs, rubs, or gallops,no edema  Respiratory:  clear to auscultation bilaterally, normal work of breathing GI: soft, nontender, nondistended, + BS MS: no deformity or atrophy  Skin: warm and dry, no rash Neuro:  Strength and sensation are intact Psych: euthymic mood, full affect Vascular: Femoral pulses +2 bilaterally.  No groin hematoma  EKG:  EKG is not ordered today.   Recent Labs: 07/02/2016: ALT 24; TSH 1.996 07/03/2016: Magnesium 1.8 11/19/2016: BUN 13; Creatinine, Ser 1.53; Hemoglobin 10.5; Platelets 160; Potassium 4.4; Sodium 137    Lipid Panel    Component Value Date/Time   CHOL 104 07/03/2016 0403   TRIG 91 07/03/2016 0403   HDL 33 (L) 07/03/2016 0403   CHOLHDL 3.2 07/03/2016 0403   VLDL 18 07/03/2016 0403   LDLCALC 53 07/03/2016 0403   LDLDIRECT 59.8 10/12/2012 0737      Wt Readings from Last 3 Encounters:  12/30/16 185 lb 3.2 oz (84 kg)  12/29/16 184 lb (83.5 kg)  12/10/16 185 lb 8 oz (84.1 kg)       No flowsheet data found.    ASSESSMENT AND PLAN:  1.  Peripheral arterial disease with severe bilateral leg claudication likely due to multilevel disease.  Status post left SFA drug-coated balloon  angioplasty and self-expanding stent placement with  resolution of left calf claudication.  He continues to have significant disease affecting the right common femoral artery and borderline disease affecting the right SFA and popliteal arteries.  However, right ABI improved surprisingly which could have been due to the fact that the sheath went through the stenosis and might have improved the blood flow in that area. He is going to increase his walking and will reevaluate symptoms in 3 months to see if he requires any procedures on the right side.   2. Coronary artery disease involving native coronary arteries without angina: Continue medical therapy.  3. CML: Currently on Gleevec.  4. Hyperlipidemia: Continue treatment with rosuvastatin with a target LDL of less than 70.    Disposition:   FU with me in 3 month  Signed,  [name], MD  12/30/2016 7:06 PM    [facility]

## 2016-12-30 ENCOUNTER — Encounter: Payer: Self-pay | Admitting: Gastroenterology

## 2016-12-30 ENCOUNTER — Ambulatory Visit (INDEPENDENT_AMBULATORY_CARE_PROVIDER_SITE_OTHER): Payer: Medicare Other | Admitting: Gastroenterology

## 2016-12-30 VITALS — BP 120/76 | HR 68 | Ht 67.0 in | Wt 185.2 lb

## 2016-12-30 DIAGNOSIS — I6523 Occlusion and stenosis of bilateral carotid arteries: Secondary | ICD-10-CM | POA: Diagnosis not present

## 2016-12-30 DIAGNOSIS — R195 Other fecal abnormalities: Secondary | ICD-10-CM | POA: Diagnosis not present

## 2016-12-30 DIAGNOSIS — R110 Nausea: Secondary | ICD-10-CM | POA: Diagnosis not present

## 2016-12-30 DIAGNOSIS — K649 Unspecified hemorrhoids: Secondary | ICD-10-CM

## 2016-12-30 DIAGNOSIS — R1310 Dysphagia, unspecified: Secondary | ICD-10-CM | POA: Diagnosis not present

## 2016-12-30 DIAGNOSIS — D132 Benign neoplasm of duodenum: Secondary | ICD-10-CM

## 2016-12-30 DIAGNOSIS — R1013 Epigastric pain: Secondary | ICD-10-CM

## 2016-12-30 MED ORDER — RANITIDINE HCL 150 MG PO TABS: 150.0000 mg | ORAL_TABLET | Freq: Two times a day (BID) | ORAL | 3 refills | Status: DC

## 2016-12-30 NOTE — Patient Instructions (Addendum)
If you are age 78 or older, your body mass index should be between 23-30. Your Body mass index is 29.01 kg/m. If this is out of the aforementioned range listed, please consider follow up with your Primary Care Provider.  If you are age 24 or younger, your body mass index should be between 19-25. Your Body mass index is 29.01 kg/m. If this is out of the aformentioned range listed, please consider follow up with your Primary Care Provider.   We have sent the following medications to your pharmacy for you to pick up at your convenience: Zantac (Sent to [facility] per your request).  Please continue using Imodium, up to twice a day, as needed.  Please purchase the following medications over the counter and take as directed:  Preparation H  Hydrocortisone cream on a glycerin suppository, once a day  Please follow up with Dr. Havery Moros in 6 months.  Happy Thanksgiving!

## 2016-12-30 NOTE — Progress Notes (Signed)
HPI :  76 year old male here for follow-up visit. 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   Dysphagia - he reports this continues to bother him mildly. He states usually cold liquids is what precipitates it, otherwise has rare dysphagia to solids. EGD 04/23/15 - normal esophagus without pathology to cause dysphagia. Benign stomach polyps. 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 He was offered a manometry procedure and declined. Barium study shows nonspecific dysmotility which is thought to be the cause of his symptoms.  Chronic nausea / dyspepsia / loose stools - this is been thought to be related to [facility] which he has been on for several years.  He had a remote gastric emtying study in 2007 showing delayed gastric emptying. He was given a trial of erythromycin previously but due to concern for worsening MG he stopped it. He did not want to try Reglan given potential neurologic side effects. Overall the symptoms are stable, he is using Zofran as needed which is working well for him. He did stop his Nexium due to concern for bone fracture with history of osteoporosis. He denies any NSAID use or aspirin use while taking Eliquis. He does note some soreness in his throat at times, but denies overt heartburn or regurgitation. He is not taking much of anything for reflux at this time.  His bowel habits are stable - he has multiple loose stools per day, fairly well controlled on Imodium as needed. He endorses irritated hemorrhoids recently. Denies any blood in the stool. Asks what he can use to treat his hemorrhoids.  He had a stent placed in his left leg for peripheral vascular disease in the past 1-2 months.  He was on Plavix for 1 month after this procedure but has since stopped it. He is a bit weary of undergoing another procedure in the near future.   Last colonoscopy 12/26/2013 - diverticulosis, otherwise normal     Past Medical History:  Diagnosis Date  . Bruises easily    d/t being on Eliquis  . Carotid artery disease ([place])    a. Duplex 05/2014: 73-41% RICA, 9-37% LICA, elevated velocities in right subclavian artery, normal left subclavian artery..  . Chronic back pain    HNP  . Chronic kidney disease (CKD)   . Claustrophobia    takes Ativan as needed  . CML (chronic myeloid leukemia) ([place])    takes Gleevec daily  . Coronary atherosclerosis of unspecified type of vessel, native or graft    a. cath in 2003 showed 10-20% LM, scattered 20% prox-mid LAD, 30% more distal LAD, 50-60% stenosis of LAD towards apex, 20% Cx, 30% dRCA, focal 95% stenosis in smaller of 2 branches of PDA, EF 60-65%.  . Diabetes mellitus ([place])   . Diarrhea    takes Imodium daily as needed  . Diverticulosis   . Diverticulosis of colon (without mention of hemorrhage)   . DJD (degenerative joint disease)   . DVT (deep venous thrombosis) ([place]) 07/2011  . Esophageal stricture   . Essential hypertension    takes Imdur and Lisinopril daily  . Gastric polyps    benign  . GERD (gastroesophageal reflux disease)    takes Nexium daily  . Glaucoma  uses eye drops  . History of bronchitis    not sure when the last time  . History of colon polyps    benign  . History of kidney stones   . History of kidney stones   . Insomnia    takes Melatonin nightly as needed  . Mixed hyperlipidemia    takes Crestor daily  . Myasthenia gravis ([place])    uses prednisone  . Osteoporosis 2016  . Peripheral edema    takes Lasix daily  . Pituitary tumor    checks Dr[facility]Walden at [facility] checks it every Jan   . Pneumonia 01/2016  . PONV (postoperative nausea and vomiting)   . Ptosis   . Pulmonary embolus ([place]) 2012  . Stroke  [facility]) 06/2015     Past Surgical History:  Procedure Laterality Date  . ABDOMINAL AORTOGRAM W/LOWER EXTREMITY N/A 11/18/2016   Procedure: ABDOMINAL AORTOGRAM W/LOWER EXTREMITY;  Surgeon: [name], MD;  Location: [place] [facility];  Service: Cardiovascular;  Laterality: N/A;  . BACK SURGERY     x2, lumbar and cervical  . CARDIAC CATHETERIZATION    . CATARACT EXTRACTION Bilateral 12/12  . COLONOSCOPY    . ESOPHAGOGASTRODUODENOSCOPY ENDOSCOPY  04/2015  . IR GENERIC HISTORICAL  03/03/2016   IR IVC FILTER PLMT / S&I [name] GUID/MOD SED 03/03/2016 [name], MD [facility] RAD  . IR GENERIC HISTORICAL  04/09/2016   IR RADIOLOGIST EVAL & MGMT 04/09/2016 [name], MD [facility] INTER[facility] RAD  . IR GENERIC HISTORICAL  04/30/2016   IR IVC FILTER RETRIEVAL / S&I [name] GUID/MOD SED 04/30/2016 [name], MD [facility]-INTER[facility] RAD  . LITHOTRIPSY    . LUMBAR LAMINECTOMY/DECOMPRESSION MICRODISCECTOMY Right 09/14/2012   Procedure: Right Lumbar three-four, four-five, Lumbar five-Sacral one decompressive laminectomy;  Surgeon: [name], MD;  Location: [place] [facility];  Service: Neurosurgery;  Laterality: Right;  . LUMBAR LAMINECTOMY/DECOMPRESSION MICRODISCECTOMY Left 03/11/2016   Procedure: Left Lumbar Two-ThreeMicrodiscectomy;  Surgeon: [name], MD;  Location: [place];  Service: Neurosurgery;  Laterality: Left;  . PERIPHERAL VASCULAR INTERVENTION  11/18/2016   Procedure: PERIPHERAL VASCULAR INTERVENTION;  Surgeon: [name], MD;  Location: [place] [facility];  Service: Cardiovascular;;  Left popliteal  . SHOULDER SURGERY Left 05/07/2009  . TONSILLECTOMY     Family History  Problem Relation Age of Onset  . Heart disease Mother   . Heart attack Mother   . Stroke Mother   . Heart disease Father   . Heart attack Father   . Stroke Father   . Breast cancer Sister        Twin   . Heart attack Sister   . Dementia Sister   . Heart disease Sister   . Heart attack Sister   . Clotting disorder  Sister   . Heart attack Sister   . Colon cancer Neg Hx    Social History   Tobacco Use  . Smoking status: Never Smoker  . Smokeless tobacco: Never Used  Substance Use Topics  . Alcohol use: No    Alcohol/week: 0.0 oz  . Drug use: No   Current Outpatient Medications  Medication Sig Dispense Refill  . alum & mag hydroxide-simeth (MAALOX/MYLANTA) 200-200-20 MG/5ML suspension Take 15 mLs by mouth every 6 (six) hours as needed for indigestion or heartburn.    . brimonidine-timolol (COMBIGAN) 0.2-0.5 % ophthalmic solution Place 1 drop into both eyes every 12 (twelve) hours.    . cholecalciferol (VITAMIN D) 1000 units tablet Take 1,000 Units by  mouth daily.     Marland Kitchen ELIQUIS 5 MG TABS tablet TAKE 1 TABLET (5 MG TOTAL) BY MOUTH 2 (TWO) TIMES DAILY. 60 tablet 3  . furosemide (LASIX) 20 MG tablet TAKE 1 TABLET BY MOUTH DAILY. 90 tablet 2  . imatinib (GLEEVEC) 100 MG tablet Take 300 mg by mouth daily at 12 noon. Take with meals and large glass of water.Caution:Chemotherapy     . isosorbide mononitrate (IMDUR) 30 MG 24 hr tablet Take 1 tablet (30 mg total) by mouth daily. 90 tablet 3  . lidocaine (LIDODERM) 5 % Place 1 patch onto the skin daily as needed (pain). Remove & Discard patch within 12 hours or as directed by MD    . lisinopril (PRINIVIL,ZESTRIL) 5 MG tablet Take 1 tablet (5 mg total) by mouth daily. 30 tablet 11  . loperamide (IMODIUM) 2 MG capsule Take 2-4 mg by mouth 3 (three) times daily as needed for diarrhea or loose stools (takes 1-2 tablets every and additional if neded). For diarrhea    . LORazepam (ATIVAN) 0.5 MG tablet Take 0.5 mg by mouth at bedtime as needed for sleep.    . Melatonin 3 MG TABS Take 3 mg by mouth at bedtime as needed (for sleep).     . Multiple Vitamin (MULTIVITAMIN) tablet Take 1 tablet by mouth daily.    Marland Kitchen NITROSTAT 0.4 MG SL tablet PLACE 1 TABLET UNDER THE TONGUE EVERY 5 MINUTES X3 DOSES AS NEEDED FOR CHEST PAIN 100 tablet 1  . ondansetron (ZOFRAN ODT) 8 MG  disintegrating tablet Take 1 tablet (8 mg total) by mouth every 8 (eight) hours as needed for nausea or vomiting. 90 tablet 1  . predniSONE (DELTASONE) 1 MG tablet Take 4 tablets (4 mg total) by mouth daily with breakfast. 360 tablet 1  . promethazine (PHENERGAN) 12.5 MG tablet Take 1 tablet (12.5 mg total) by mouth every 6 (six) hours as needed for nausea or vomiting. 30 tablet 0  . pyridostigmine (MESTINON) 60 MG tablet Take 60 mg by mouth daily.    . rosuvastatin (CRESTOR) 10 MG tablet Take 5 mg by mouth daily.    . traMADol (ULTRAM) 50 MG tablet Take 50 mg by mouth every 6 (six) hours as needed (pain).     No current facility-administered medications for this visit.    Allergies  Allergen Reactions  . Phenergan [Promethazine Hcl] Swelling    Cannot be combined with Zofran because swelling of the eyes resulted and patient became very restless-  . Zofran [Ondansetron Hcl] Other (See Comments)    Cannot be combined with Phenergan because swelling of the eyes resulted and patient became very restless-  . Sulfamethoxazole Rash and Itching     Review of Systems: All systems reviewed and negative except where noted in HPI.    No results found.  Physical Exam: BP 120/76   Pulse 68   Ht 5\' 7"  (1.702 m)   Wt 185 lb 3.2 oz (84 kg)   BMI 29.01 kg/m  Constitutional: Pleasant,well-developed, male in no acute distress. HEENT: Normocephalic and atraumatic. Conjunctivae are normal. No scleral icterus. Neck supple.  Cardiovascular: Normal rate, regular rhythm.  Pulmonary/chest: Effort normal and breath sounds normal. No wheezing, rales or rhonchi. Abdominal: Soft, nondistended, nontender.  There are no masses palpable. No hepatomegaly. Extremities: (+) 1 LE edema Lymphadenopathy: No cervical adenopathy noted. Neurological: Alert and oriented to person place and time. Skin: Skin is warm and dry. No rashes noted. Psychiatric: Normal mood and affect. Behavior is  normal.   ASSESSMENT AND  PLAN: 76 year old male with multiple medical problems here for reassessment of the following issues.  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 Declined manometry in the past although I don't think it would change his management much. He did not have an obvious stricture on his last EGD. We discussed perhaps dilating his esophagus empirically if he has another endoscopy. To do this he would need to stop his Eliquis. I'm a bit wary of stopping Eliquis in light of his CVA in 2017 and then another TIA 6 months ago. He agreed wanted to hold off on any procedure that would entail holding Eliquis at this time. He will chew food well and eat slowly to minimize symptoms.   Dyspepsia / nausea  / chronic loose stools - he likely has the symptoms related to [place] use. He had a period of time when he stopped Gleevec and all the symptoms resolved, but now told he needs gleevacndefinitely. He is using Zofran PRN which works well. He will continue immodium, a few tabs per day as needed. In regards to his throat soreness, he could be having reflux causing this. Agree with holding PPI in light of his osteoporosis of possible. We will try him on Zantac 150 mg once to twice daily as needed and see if this helps.  Hemorrhoids - recent irritation of hemorrhoids. Reassured him he can use Preparation H as needed. He can also use hydrocortisone cream via glycerin suppository once daily as needed. He is not a good candidate for hemorrhoidal banding given his anticoagulation use.  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 No guidelines currently regarding surveillance interval for sporadic duodenal adenoma however recent literature recommends surveillance within 6 months for sporadic duodenal adenomas (World J Gastroenterol. 2016 Jan). I have discussed this with him. I do think his lesion was low risk. I  think it's okay to do an EGD while on Eliquis to survey for adenomas, although don't feel strongly that he needs it now in light of TIA 6 months ago. He wishes to hold off on surveillance at this time which is reasonable, reassess him again in 6 months.   Saltillo Cellar, MD [facility] Pager [phone]

## 2017-01-04 DIAGNOSIS — H401131 Primary open-angle glaucoma, bilateral, mild stage: Secondary | ICD-10-CM | POA: Diagnosis not present

## 2017-01-13 ENCOUNTER — Telehealth: Payer: Self-pay | Admitting: Endocrinology

## 2017-01-13 NOTE — Telephone Encounter (Signed)
10:45 tomorrow am

## 2017-01-13 NOTE — Telephone Encounter (Signed)
I called patient on both numbers & left VM asking if he could make 10:45 appt tomorrow morning. If he could to call back ASAP.

## 2017-01-13 NOTE — Telephone Encounter (Signed)
-----   Message from [name] sent at 01/13/2017  1:36 PM EST ----- Regarding: appointment Patient need to be seen tomorrow, wife stated patient he has an infection in face. Please advise

## 2017-01-14 ENCOUNTER — Ambulatory Visit (INDEPENDENT_AMBULATORY_CARE_PROVIDER_SITE_OTHER): Payer: Medicare Other | Admitting: Endocrinology

## 2017-01-14 ENCOUNTER — Encounter: Payer: Self-pay | Admitting: Endocrinology

## 2017-01-14 VITALS — BP 110/62 | HR 56 | Wt 185.4 lb

## 2017-01-14 DIAGNOSIS — R51 Headache: Secondary | ICD-10-CM

## 2017-01-14 DIAGNOSIS — I6523 Occlusion and stenosis of bilateral carotid arteries: Secondary | ICD-10-CM | POA: Diagnosis not present

## 2017-01-14 DIAGNOSIS — R519 Headache, unspecified: Secondary | ICD-10-CM

## 2017-01-14 NOTE — Patient Instructions (Addendum)
Let's recheck a CT scan.  you will receive a phone call, about a day and time for an appointment.

## 2017-01-14 NOTE — Progress Notes (Signed)
Subjective:    Patient ID: [name], male    DOB: 11-Jun-1940, 76 y.o.   MRN: [id number]  HPI Pt states 2 weeks of moderate pain at the left maxillary area, but no assoc earache.  He saw dentist a few days ago, who told him teeth were fine, but rx'ed augmentin.   Past Medical History:  Diagnosis Date  . Bruises easily    d/t being on Eliquis  . Carotid artery disease ([place])    a. Duplex 05/2014: 80-99% RICA, 8-33% LICA, elevated velocities in right subclavian artery, normal left subclavian artery..  . Chronic back pain    HNP  . Chronic kidney disease (CKD)   . Claustrophobia    takes Ativan as needed  . CML (chronic myeloid leukemia) ([place])    takes Gleevec daily  . Coronary atherosclerosis of unspecified type of vessel, native or graft    a. cath in 2003 showed 10-20% LM, scattered 20% prox-mid LAD, 30% more distal LAD, 50-60% stenosis of LAD towards apex, 20% Cx, 30% dRCA, focal 95% stenosis in smaller of 2 branches of PDA, EF 60-65%.  . Diabetes mellitus ([place])   . Diarrhea    takes Imodium daily as needed  . Diverticulosis   . Diverticulosis of colon (without mention of hemorrhage)   . DJD (degenerative joint disease)   . DVT (deep venous thrombosis) ([place]) 07/2011  . Esophageal stricture   . Essential hypertension    takes Imdur and Lisinopril daily  . Gastric polyps    benign  . GERD (gastroesophageal reflux disease)    takes Nexium daily  . Glaucoma    uses eye drops  . History of bronchitis    not sure when the last time  . History of colon polyps    benign  . History of kidney stones   . History of kidney stones   . Insomnia    takes Melatonin nightly as needed  . Mixed hyperlipidemia    takes Crestor daily  . Myasthenia gravis ([place])    uses prednisone  . Osteoporosis 2016  . Peripheral edema    takes Lasix daily  . Pituitary tumor    [facility]Walden at [facility] checks it every Jan   . Pneumonia 01/2016  . PONV (postoperative nausea and vomiting)   .  Ptosis   . Pulmonary embolus ([place]) 2012  . Stroke [facility]) 06/2015    Past Surgical History:  Procedure Laterality Date  . ABDOMINAL AORTOGRAM W/LOWER EXTREMITY N/A 11/18/2016   Procedure: ABDOMINAL AORTOGRAM W/LOWER EXTREMITY;  Surgeon: [name], MD;  Location: [place] [facility];  Service: Cardiovascular;  Laterality: N/A;  . BACK SURGERY     x2, lumbar and cervical  . CARDIAC CATHETERIZATION    . CATARACT EXTRACTION Bilateral 12/12  . COLONOSCOPY    . ESOPHAGOGASTRODUODENOSCOPY ENDOSCOPY  04/2015  . IR GENERIC HISTORICAL  03/03/2016   IR IVC FILTER PLMT / S&I [name] GUID/MOD SED 03/03/2016 [name], MD [facility]  . IR GENERIC HISTORICAL  04/09/2016   IR RADIOLOGIST EVAL & MGMT 04/09/2016 [name], MD [facility] INTER[facility]  . IR GENERIC HISTORICAL  04/30/2016   IR IVC FILTER RETRIEVAL / S&I [name] GUID/MOD SED 04/30/2016 [name], MD [facility]  . LITHOTRIPSY    . LUMBAR LAMINECTOMY/DECOMPRESSION MICRODISCECTOMY Right 09/14/2012   Procedure: Right Lumbar three-four, four-five, Lumbar five-Sacral one decompressive laminectomy;  Surgeon: [name], MD;  Location: [place] [facility];  Service: Neurosurgery;  Laterality: Right;  .  LUMBAR LAMINECTOMY/DECOMPRESSION MICRODISCECTOMY Left 03/11/2016   Procedure: Left Lumbar Two-ThreeMicrodiscectomy;  Surgeon: [name], MD;  Location: [place];  Service: Neurosurgery;  Laterality: Left;  . PERIPHERAL VASCULAR INTERVENTION  11/18/2016   Procedure: PERIPHERAL VASCULAR INTERVENTION;  Surgeon: [name], MD;  Location: [place] [facility];  Service: Cardiovascular;;  Left popliteal  . SHOULDER SURGERY Left 05/07/2009  . TONSILLECTOMY      Social History   Socioeconomic History  . Marital status: Married    Spouse name: Hassan Rowan  . Number of children: 2  . Years of education: 1-College  . Highest education level: Not on file  Social Needs  . Financial resource strain: Not on file  . Food insecurity - worry: Not on file  .  Food insecurity - inability: Not on file  . Transportation needs - medical: Not on file  . Transportation needs - non-medical: Not on file  Occupational History  . Occupation: retired    Fish farm manager: RETIRED    Comment: Retired  Tobacco Use  . Smoking status: Never Smoker  . Smokeless tobacco: Never Used  Substance and Sexual Activity  . Alcohol use: No    Alcohol/week: 0.0 oz  . Drug use: No  . Sexual activity: Not Currently  Other Topics Concern  . Not on file  Social History Narrative   Lives at home w/ his wife   Patient is right handed.   Patient has 1-2 cups of caffeine daily.    Current Outpatient Medications on File Prior to Visit  Medication Sig Dispense Refill  . alum & mag hydroxide-simeth (MAALOX/MYLANTA) 200-200-20 MG/5ML suspension Take 15 mLs by mouth every 6 (six) hours as needed for indigestion or heartburn.    . brimonidine-timolol (COMBIGAN) 0.2-0.5 % ophthalmic solution Place 1 drop into both eyes every 12 (twelve) hours.    . cholecalciferol (VITAMIN D) 1000 units tablet Take 1,000 Units by mouth daily.     Marland Kitchen ELIQUIS 5 MG TABS tablet TAKE 1 TABLET (5 MG TOTAL) BY MOUTH 2 (TWO) TIMES DAILY. 60 tablet 3  . furosemide (LASIX) 20 MG tablet TAKE 1 TABLET BY MOUTH DAILY. 90 tablet 2  . imatinib (GLEEVEC) 100 MG tablet Take 300 mg by mouth daily at 12 noon. Take with meals and large glass of water.Caution:Chemotherapy     . isosorbide mononitrate (IMDUR) 30 MG 24 hr tablet Take 1 tablet (30 mg total) by mouth daily. 90 tablet 3  . lidocaine (LIDODERM) 5 % Place 1 patch onto the skin daily as needed (pain). Remove & Discard patch within 12 hours or as directed by MD    . lisinopril (PRINIVIL,ZESTRIL) 5 MG tablet Take 1 tablet (5 mg total) by mouth daily. 30 tablet 11  . loperamide (IMODIUM) 2 MG capsule Take 2-4 mg by mouth 3 (three) times daily as needed for diarrhea or loose stools (takes 1-2 tablets every and additional if neded). For diarrhea    . LORazepam (ATIVAN)  0.5 MG tablet Take 0.5 mg by mouth at bedtime as needed for sleep.    . Melatonin 3 MG TABS Take 3 mg by mouth at bedtime as needed (for sleep).     . Multiple Vitamin (MULTIVITAMIN) tablet Take 1 tablet by mouth daily.    Marland Kitchen NITROSTAT 0.4 MG SL tablet PLACE 1 TABLET UNDER THE TONGUE EVERY 5 MINUTES X3 DOSES AS NEEDED FOR CHEST PAIN 100 tablet 1  . ondansetron (ZOFRAN ODT) 8 MG disintegrating tablet Take 1 tablet (8 mg total) by  mouth every 8 (eight) hours as needed for nausea or vomiting. 90 tablet 1  . predniSONE (DELTASONE) 1 MG tablet Take 4 tablets (4 mg total) by mouth daily with breakfast. 360 tablet 1  . promethazine (PHENERGAN) 12.5 MG tablet Take 1 tablet (12.5 mg total) by mouth every 6 (six) hours as needed for nausea or vomiting. 30 tablet 0  . pyridostigmine (MESTINON) 60 MG tablet Take 60 mg by mouth daily.    . ranitidine (ZANTAC) 150 MG tablet Take 1 tablet (150 mg total) by mouth 2 (two) times daily. Take 1 to 2 tablets by mouth as needed 90 tablet 3  . rosuvastatin (CRESTOR) 10 MG tablet Take 5 mg by mouth daily.    . traMADol (ULTRAM) 50 MG tablet Take 50 mg by mouth every 6 (six) hours as needed (pain).    . [DISCONTINUED] zolpidem (AMBIEN) 10 MG tablet Take 10 mg by mouth at bedtime as needed.      No current facility-administered medications on file prior to visit.     Allergies  Allergen Reactions  . Phenergan [Promethazine Hcl] Swelling    Cannot be combined with Zofran because swelling of the eyes resulted and patient became very restless-  . Zofran [Ondansetron Hcl] Other (See Comments)    Cannot be combined with Phenergan because swelling of the eyes resulted and patient became very restless-  . Sulfamethoxazole Rash and Itching    Family History  Problem Relation Age of Onset  . Heart disease Mother   . Heart attack Mother   . Stroke Mother   . Heart disease Father   . Heart attack Father   . Stroke Father   . Breast cancer Sister        Twin   . Heart  attack Sister   . Dementia Sister   . Heart disease Sister   . Heart attack Sister   . Clotting disorder Sister   . Heart attack Sister   . Colon cancer Neg Hx     BP 110/62 (BP Location: Left Arm, Patient Position: Sitting, Cuff Size: Normal)   Pulse (!) 56   Wt 185 lb 6.4 oz (84.1 kg)   SpO2 98%   BMI 29.04 kg/m    Review of Systems Denies visual loss.  He says if he had fever, it is better.      Objective:   Physical Exam VITAL SIGNS:  See vs page GENERAL: no distress head: no deformity  eyes: no periorbital swelling, no proptosis  external nose and ears are normal  mouth: no lesion seen Both eac's and tm's are normal.        Assessment & Plan:  Maxillary pain, new to me, uncertain etiology: not improved with abx renal insuff: he cannot have CT contrast  Patient Instructions  Let's recheck a CT scan.  you will receive a phone call, about a day and time for an appointment.

## 2017-01-20 ENCOUNTER — Other Ambulatory Visit: Payer: Self-pay | Admitting: Endocrinology

## 2017-01-20 ENCOUNTER — Encounter: Payer: Self-pay | Admitting: Endocrinology

## 2017-01-22 ENCOUNTER — Ambulatory Visit: Payer: Medicare Other | Admitting: Vascular Surgery

## 2017-01-22 ENCOUNTER — Encounter (HOSPITAL_COMMUNITY): Payer: Medicare Other

## 2017-01-26 ENCOUNTER — Telehealth: Payer: Self-pay | Admitting: Endocrinology

## 2017-01-26 NOTE — Telephone Encounter (Signed)
Pt called about a CAT scan being ordered on 12/06. Pt has not heard anything as to when it will be done. Please advise?  Call pt @ [phone]. Thank you!

## 2017-01-26 NOTE — Telephone Encounter (Signed)
Patient had already called [facility] imaging & appt is made for tomorrow.

## 2017-01-27 ENCOUNTER — Ambulatory Visit
Admission: RE | Admit: 2017-01-27 | Discharge: 2017-01-27 | Disposition: A | Discharge status: Home / Self Care | Payer: Medicare Other | Source: Ambulatory Visit | Attending: Endocrinology | Admitting: Endocrinology

## 2017-01-27 DIAGNOSIS — J3489 Other specified disorders of nose and nasal sinuses: Secondary | ICD-10-CM | POA: Diagnosis not present

## 2017-01-27 DIAGNOSIS — R519 Headache, unspecified: Secondary | ICD-10-CM

## 2017-01-27 DIAGNOSIS — R51 Headache: Principal | ICD-10-CM

## 2017-01-28 ENCOUNTER — Encounter: Payer: Self-pay | Admitting: Endocrinology

## 2017-02-04 ENCOUNTER — Encounter: Payer: Self-pay | Admitting: Endocrinology

## 2017-02-09 ENCOUNTER — Encounter: Payer: Self-pay | Admitting: Gastroenterology

## 2017-02-10 ENCOUNTER — Encounter: Payer: Self-pay | Admitting: Endocrinology

## 2017-02-10 NOTE — Progress Notes (Unsigned)
Flu vaccine completed

## 2017-02-11 NOTE — Telephone Encounter (Signed)
Patient is call on the status of a referral for his sinus, he haven't heard anything in a month. He ask Dr Loanne Drilling to please get back with him today. Thank you

## 2017-02-12 NOTE — Telephone Encounter (Signed)
Pt is in need of the referral for the ENT MD. He is suffering and is struggling with his sinuses right now. Please make this an urgent referral thank you

## 2017-02-16 NOTE — Telephone Encounter (Signed)
Patient stated that no one has called him to set up an appointment for the referral, he stated that he has been dealing with this since December, he is about to give up. Please advise

## 2017-02-18 ENCOUNTER — Encounter: Payer: Self-pay | Admitting: Endocrinology

## 2017-02-19 ENCOUNTER — Other Ambulatory Visit: Payer: Self-pay | Admitting: Endocrinology

## 2017-02-19 DIAGNOSIS — R519 Headache, unspecified: Secondary | ICD-10-CM

## 2017-02-19 DIAGNOSIS — R51 Headache: Principal | ICD-10-CM

## 2017-02-20 ENCOUNTER — Encounter: Payer: Self-pay | Admitting: Internal Medicine

## 2017-02-20 ENCOUNTER — Ambulatory Visit (INDEPENDENT_AMBULATORY_CARE_PROVIDER_SITE_OTHER): Payer: Medicare Other | Admitting: Internal Medicine

## 2017-02-20 VITALS — BP 124/84 | HR 60 | Temp 98.0°F | Resp 14 | Ht 67.0 in | Wt 181.0 lb

## 2017-02-20 DIAGNOSIS — J320 Chronic maxillary sinusitis: Secondary | ICD-10-CM

## 2017-02-20 MED ORDER — PREDNISONE 10 MG PO TABS: ORAL_TABLET | ORAL | 0 refills | Status: DC

## 2017-02-20 MED ORDER — AMOXICILLIN-POT CLAVULANATE 875-125 MG PO TABS: 1.0000 | ORAL_TABLET | Freq: Two times a day (BID) | ORAL | 0 refills | Status: DC

## 2017-02-20 NOTE — Patient Instructions (Signed)

## 2017-02-20 NOTE — Progress Notes (Signed)
Subjective:    Patient ID: [name], male    DOB: Nov 30, 1940, 77 y.o.   MRN: [id number]  HPI  Pt presents to the clinic today with c/o right sinus pain and pressure and headaches. He reports this started 1 month ago. He originally saw his dentist, who put him on Amoxil, which he took but he denies improvement. He saw his PCP , Dr. Loanne Drilling, who ordered a CT sinus which showed complete opacification of the right maxillary sinus. He reports a referral was placed to ENT yesterday but he has not heard anything about an appt. He describes the pain as pressure. He is not blowing anything out of his nose. He is having trouble sleeping secondary to the pain. He has not taken anything OTC for his symptoms.   Review of Systems      Past Medical History:  Diagnosis Date  . Bruises easily    d/t being on Eliquis  . Carotid artery disease ([place])    a. Duplex 05/2014: 02-63% RICA, 7-85% LICA, elevated velocities in right subclavian artery, normal left subclavian artery..  . Chronic back pain    HNP  . Chronic kidney disease (CKD)   . Claustrophobia    takes Ativan as needed  . 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 Coronary atherosclerosis of unspecified type of vessel, native or graft    a. cath in 2003 showed 10-20% LM, scattered 20% prox-mid LAD, 30% more distal LAD, 50-60% stenosis of LAD towards apex, 20% Cx, 30% dRCA, focal 95% stenosis in smaller of 2 branches of PDA, EF 60-65%.  . Diabetes mellitus ([place])   . Diarrhea    takes Imodium daily as needed  . Diverticulosis   . Diverticulosis of colon (without mention of hemorrhage)   . DJD (degenerative joint disease)   . DVT (deep venous thrombosis) ([place]) 07/2011  . Esophageal stricture   . Essential hypertension    takes Imdur and Lisinopril daily  . Gastric polyps    benign  . GERD (gastroesophageal reflux disease)    takes Nexium daily  . Glaucoma    uses eye drops  . History of bronchitis    not sure  when the last time  . History of colon polyps    benign  . History of kidney stones   . History of kidney stones   . Insomnia    takes Melatonin nightly as needed  . Mixed hyperlipidemia    takes Crestor daily  . Myasthenia gravis ([place])    uses prednisone  . Osteoporosis 2016  . Peripheral edema    takes Lasix daily  . Pituitary tumor    checks Dr[facility]Walden at [facility] checks it every Jan   . Pneumonia 01/2016  . PONV (postoperative nausea and vomiting)   . Ptosis   . Pulmonary embolus ([place]) 2012  . Stroke [facility]) 06/2015    Current Outpatient Medications  Medication Sig Dispense Refill  . alum & mag hydroxide-simeth (MAALOX/MYLANTA) 200-200-20 MG/5ML suspension Take 15 mLs by mouth every 6 (six) hours as needed for indigestion or heartburn.    Marland Kitchen amoxicillin-clavulanate (AUGMENTIN) 875-125 MG tablet Take 1 tablet by mouth 2 (two) times daily. 20 tablet 0  . brimonidine-timolol (COMBIGAN) 0.2-0.5 % ophthalmic solution Place 1 drop into both eyes every 12 (twelve) hours.    . cholecalciferol (VITAMIN D) 1000 units tablet Take 1,000 Units by mouth daily.     Marland Kitchen ELIQUIS 5 MG TABS tablet  TAKE 1 TABLET (5 MG TOTAL) BY MOUTH 2 (TWO) TIMES DAILY. 60 tablet 3  . furosemide (LASIX) 20 MG tablet TAKE 1 TABLET BY MOUTH DAILY. 90 tablet 2  . imatinib (GLEEVEC) 100 MG tablet Take 300 mg by mouth daily at 12 noon. Take with meals and large glass of water.Caution:Chemotherapy     . isosorbide mononitrate (IMDUR) 30 MG 24 hr tablet Take 1 tablet (30 mg total) by mouth daily. 90 tablet 3  . lidocaine (LIDODERM) 5 % Place 1 patch onto the skin daily as needed (pain). Remove & Discard patch within 12 hours or as directed by MD    . lisinopril (PRINIVIL,ZESTRIL) 5 MG tablet Take 1 tablet (5 mg total) by mouth daily. 30 tablet 11  . loperamide (IMODIUM) 2 MG capsule Take 2-4 mg by mouth 3 (three) times daily as needed for diarrhea or loose stools (takes 1-2 tablets every and additional if neded). For diarrhea     . LORazepam (ATIVAN) 0.5 MG tablet TAKE 1 TABLET BY MOUTH AT BEDTIME AS NEEDED FOR SLEEP 30 tablet 3  . Melatonin 3 MG TABS Take 3 mg by mouth at bedtime as needed (for sleep).     . Multiple Vitamin (MULTIVITAMIN) tablet Take 1 tablet by mouth daily.    Marland Kitchen NITROSTAT 0.4 MG SL tablet PLACE 1 TABLET UNDER THE TONGUE EVERY 5 MINUTES X3 DOSES AS NEEDED FOR CHEST PAIN 100 tablet 1  . ondansetron (ZOFRAN ODT) 8 MG disintegrating tablet Take 1 tablet (8 mg total) by mouth every 8 (eight) hours as needed for nausea or vomiting. 90 tablet 1  . predniSONE (DELTASONE) 1 MG tablet Take 4 tablets (4 mg total) by mouth daily with breakfast. 360 tablet 1  . predniSONE (DELTASONE) 10 MG tablet Take 3 tabs on days 1-3, take 2 tabs on days 4-6, take 1 tab on days 7-9 18 tablet 0  . promethazine (PHENERGAN) 12.5 MG tablet Take 1 tablet (12.5 mg total) by mouth every 6 (six) hours as needed for nausea or vomiting. 30 tablet 0  . pyridostigmine (MESTINON) 60 MG tablet Take 60 mg by mouth daily.    . ranitidine (ZANTAC) 150 MG tablet Take 1 tablet (150 mg total) by mouth 2 (two) times daily. Take 1 to 2 tablets by mouth as needed 90 tablet 3  . rosuvastatin (CRESTOR) 10 MG tablet Take 5 mg by mouth daily.    . traMADol (ULTRAM) 50 MG tablet Take 50 mg by mouth every 6 (six) hours as needed (pain).     No current facility-administered medications for this visit.     Allergies  Allergen Reactions  . Phenergan [Promethazine Hcl] Swelling    Cannot be combined with Zofran because swelling of the eyes resulted and patient became very restless-  . Zofran [Ondansetron Hcl] Other (See Comments)    Cannot be combined with Phenergan because swelling of the eyes resulted and patient became very restless-  . Sulfamethoxazole Rash and Itching    Family History  Problem Relation Age of Onset  . Heart disease Mother   . Heart attack Mother   . Stroke Mother   . Heart disease Father   . Heart attack Father   . Stroke  Father   . Breast cancer Sister        Twin   . Heart attack Sister   . Dementia Sister   . Heart disease Sister   . Heart attack Sister   . Clotting disorder Sister   .  Heart attack Sister   . Colon cancer Neg Hx     Social History   Socioeconomic History  . Marital status: Married    Spouse name: Hassan Rowan  . Number of children: 2  . Years of education: 1-College  . Highest education level: Not on file  Social Needs  . Financial resource strain: Not on file  . Food insecurity - worry: Not on file  . Food insecurity - inability: Not on file  . Transportation needs - medical: Not on file  . Transportation needs - non-medical: Not on file  Occupational History  . Occupation: retired    Fish farm manager: RETIRED    Comment: Retired  Tobacco Use  . Smoking status: Never Smoker  . Smokeless tobacco: Never Used  Substance and Sexual Activity  . Alcohol use: No    Alcohol/week: 0.0 oz  . Drug use: No  . Sexual activity: Not Currently  Other Topics Concern  . Not on file  Social History Narrative   Lives at home w/ his wife   Patient is right handed.   Patient has 1-2 cups of caffeine daily.     Constitutional: Pt reports headaches. Denies fever, malaise, fatigue, or abrupt weight changes.  HEENT: Pt reports right side facial pain and pressure. Denies eye pain, eye redness, ear pain, ringing in the ears, wax buildup, runny nose, nasal congestion, bloody nose, or sore throat. Respiratory: Denies difficulty breathing, shortness of breath, cough or sputum production.    No other specific complaints in a complete review of systems (except as listed in HPI above).  Objective:   Physical Exam   BP 124/84 (BP Location: Left Arm, Patient Position: Sitting, Cuff Size: Normal)   Pulse 60   Temp 98 F (36.7 C) (Oral)   Resp 14   Ht 5\' 7"  (1.702 m)   Wt 181 lb (82.1 kg)   BMI 28.35 kg/m  Wt Readings from Last 3 Encounters:  02/20/17 181 lb (82.1 kg)  01/14/17 185 lb 6.4 oz (84.1  kg)  12/30/16 185 lb 3.2 oz (84 kg)    General: Appears his stated age, in NAD. Skin: Warm, dry and intact. Swelling noted over right maxillary sinus.  HEENT: Head: normal shape and size, right maxillary sinus tenderness noted; Ears: Tm's gray and intact, normal light reflex;  Neck:  No adenopathy noted.   BMET    Component Value Date/Time   NA 137 11/19/2016 0416   NA 141 11/10/2016 0902   NA 141 02/26/2012 0852   K 4.4 11/19/2016 0416   K 3.4 (L) 02/26/2012 0852   CL 105 11/19/2016 0416   CL 105 02/26/2012 0852   CO2 25 11/19/2016 0416   CO2 28 02/26/2012 0852   GLUCOSE 115 (H) 11/19/2016 0416   GLUCOSE 77 02/26/2012 0852   BUN 13 11/19/2016 0416   BUN 14 11/10/2016 0902   BUN 13.0 02/26/2012 0852   CREATININE 1.53 (H) 11/19/2016 0416   CREATININE 1.33 05/11/2013 1246   CREATININE 1.3 02/26/2012 0852   CALCIUM 8.3 (L) 11/19/2016 0416   CALCIUM 9.1 02/26/2012 0852   GFRNONAA 42 (L) 11/19/2016 0416   GFRAA 48 (L) 11/19/2016 0416    Lipid Panel     Component Value Date/Time   CHOL 104 07/03/2016 0403   TRIG 91 07/03/2016 0403   HDL 33 (L) 07/03/2016 0403   CHOLHDL 3.2 07/03/2016 0403   VLDL 18 07/03/2016 0403   LDLCALC 53 07/03/2016 0403    CBC  Component Value Date/Time   WBC 7.8 11/19/2016 1247   RBC 3.31 (L) 11/19/2016 1247   HGB 10.5 (L) 11/19/2016 1247   HGB 11.8 (L) 11/10/2016 0902   HGB 12.4 (L) 11/21/2012 1008   HCT 31.6 (L) 11/19/2016 1247   HCT 35.7 (L) 11/10/2016 0902   HCT 35.8 (L) 11/21/2012 1008   PLT 160 11/19/2016 1247   PLT 214 11/10/2016 0902   MCV 95.5 11/19/2016 1247   MCV 97 11/10/2016 0902   MCV 90.7 11/21/2012 1008   MCH 31.7 11/19/2016 1247   MCHC 33.2 11/19/2016 1247   RDW 14.0 11/19/2016 1247   RDW 14.3 11/10/2016 0902   RDW 13.7 11/21/2012 1008   LYMPHSABS 1.8 07/02/2016 0451   LYMPHSABS 1.1 11/21/2012 1008   MONOABS 1.1 (H) 07/02/2016 0451   MONOABS 0.6 11/21/2012 1008   EOSABS 0.3 07/02/2016 0451   EOSABS 0.1  11/21/2012 1008   BASOSABS 0.1 07/02/2016 0451   BASOSABS 0.0 11/21/2012 1008    Hgb A1C Lab Results  Component Value Date   HGBA1C 5.5 07/03/2016           Assessment & Plan:  Right Maxillary Sinusitis:  eRx for Pred Taper x 9 days (hold your daily Prednisone) eRx for Augmentin BID x 10 days Follow up Monday about referral to ENT  Return precautions discussed Webb Silversmith, NP

## 2017-02-20 NOTE — Progress Notes (Signed)
Pre visit review using our clinic review tool, if applicable. No additional management support is needed unless otherwise documented below in the visit note. 

## 2017-02-25 NOTE — Progress Notes (Signed)
Established Carotid Patient   History of Present Illness   [name] is a 77 y.o. (July 25, 1940) male who presents with chief complaint: routine follow up.  Pt has had no visual disturbances over the last year.  .  Previous carotid studies demonstrated: RICA 41-32% stenosis, LICA 4-40% stenosis.  Patient has unknown history of TIA or stroke symptom.  The patient has never had amaurosis fugax or monocular blindness.  She has had an episode double vision felt to be related his MG per Neuro.  The patient has never had facial drooping or hemiplegia.  The patient has never had receptive or expressive aphasia.  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  The patient's PMH, PSH, SH, and FamHx are unchanged from 07/24/16.  Current Outpatient Medications  Medication Sig Dispense Refill  . alum & mag hydroxide-simeth (MAALOX/MYLANTA) 200-200-20 MG/5ML suspension Take 15 mLs by mouth every 6 (six) hours as needed for indigestion or heartburn.    Marland Kitchen amoxicillin-clavulanate (AUGMENTIN) 875-125 MG tablet Take 1 tablet by mouth 2 (two) times daily. 20 tablet 0  . brimonidine-timolol (COMBIGAN) 0.2-0.5 % ophthalmic solution Place 1 drop into both eyes every 12 (twelve) hours.    . cholecalciferol (VITAMIN D) 1000 units tablet Take 1,000 Units by mouth daily.     Marland Kitchen ELIQUIS 5 MG TABS tablet TAKE 1 TABLET (5 MG TOTAL) BY MOUTH 2 (TWO) TIMES DAILY. 60 tablet 3  . furosemide (LASIX) 20 MG tablet TAKE 1 TABLET BY MOUTH DAILY. 90 tablet 2  . imatinib (GLEEVEC) 100 MG tablet Take 300 mg by mouth daily at 12 noon. Take with meals and large glass of water.Caution:Chemotherapy     . isosorbide mononitrate (IMDUR) 30 MG 24 hr tablet Take 1 tablet (30 mg total) by mouth daily. 90 tablet 3  . lidocaine (LIDODERM) 5 % Place 1 patch onto the skin daily as needed (pain). Remove & Discard patch within 12 hours or as directed by MD    . lisinopril (PRINIVIL,ZESTRIL) 5 MG tablet Take 1 tablet (5 mg total) by mouth daily.  30 tablet 11  . loperamide (IMODIUM) 2 MG capsule Take 2-4 mg by mouth 3 (three) times daily as needed for diarrhea or loose stools (takes 1-2 tablets every and additional if neded). For diarrhea    . LORazepam (ATIVAN) 0.5 MG tablet TAKE 1 TABLET BY MOUTH AT BEDTIME AS NEEDED FOR SLEEP 30 tablet 3  . Melatonin 3 MG TABS Take 3 mg by mouth at bedtime as needed (for sleep).     . Multiple Vitamin (MULTIVITAMIN) tablet Take 1 tablet by mouth daily.    Marland Kitchen NITROSTAT 0.4 MG SL tablet PLACE 1 TABLET UNDER THE TONGUE EVERY 5 MINUTES X3 DOSES AS NEEDED FOR CHEST PAIN 100 tablet 1  . ondansetron (ZOFRAN ODT) 8 MG disintegrating tablet Take 1 tablet (8 mg total) by mouth every 8 (eight) hours as needed for nausea or vomiting. 90 tablet 1  . predniSONE (DELTASONE) 1 MG tablet Take 4 tablets (4 mg total) by mouth daily with breakfast. 360 tablet 1  . predniSONE (DELTASONE) 10 MG tablet Take 3 tabs on days 1-3, take 2 tabs on days 4-6, take 1 tab on days 7-9 18 tablet 0  . promethazine (PHENERGAN) 12.5 MG tablet Take 1 tablet (12.5 mg total) by mouth every 6 (six) hours as needed for nausea or vomiting. 30 tablet 0  . pyridostigmine (MESTINON) 60 MG tablet Take 60 mg by mouth daily.    Marland Kitchen  ranitidine (ZANTAC) 150 MG tablet Take 1 tablet (150 mg total) by mouth 2 (two) times daily. Take 1 to 2 tablets by mouth as needed 90 tablet 3  . rosuvastatin (CRESTOR) 10 MG tablet Take 5 mg by mouth daily.    . traMADol (ULTRAM) 50 MG tablet Take 50 mg by mouth every 6 (six) hours as needed (pain).     No current facility-administered medications for this visit.     On ROS today: no neuro sx, no visual changes   Physical Examination   Vitals:   02/26/17 1349 02/26/17 1351  BP: (!) 142/67 (!) 143/81  Pulse: 66   Resp: 20   SpO2: 99%   Weight: 179 lb 3.2 oz (81.3 kg)   Height: 5\' 7"  (1.702 m)    Body mass index is 28.07 kg/m.  General Alert, O x 3, WD, NAD  Neck Supple, mid-line trachea,    Pulmonary Sym exp,  good B air movt, CTA B  Cardiac RRR, Nl S1, S2, no Murmurs, No rubs, No S3,S4  Vascular Vessel Right Left  Radial Palpable Palpable  Brachial Palpable Palpable  Carotid Palpable, No Bruit Palpable, No Bruit  Aorta Not palpable N/A  Femoral Palpable Palpable  Popliteal Not palpable Not palpable  PT Not palpable Not palpable  DP Not palpable Not palpable    Gastro- intestinal soft, non-distended, non-tender to palpation, No guarding or rebound, no HSM, no masses, no CVAT B, No palpable prominent aortic pulse,    Musculo- skeletal M/S 5/5 throughout  , Extremities without ischemic changes    Neurologic Cranial nerves 2-12 intact , Pain and light touch intact in extremities , Motor exam as listed above    Non-Invasive Vascular Imaging   B Carotid Duplex (02/26/2017):   R ICA stenosis:  1-39%  R VA: patent and antegrade  L ICA stenosis:  1-39%   L VA: occluded  Medical Decision Making   [name] is a 77 y.o. male who presents with:  1. 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 Recent double vision with dx of TIA 3. Myasthenia gravis   4. CML (chronic myeloid leukemia)  5. L cerebellar stroke 2017 6. Chronic anticoagulation hx of DVT, B PE's 2013 now on Eliquis fo the past year after Coumadin therapy for 3 years prior.   Based on the patient's vascular studies and examination, I have offered the patient: annual B carotid duplex.  Dr. Fletcher Anon is following for his PAD.  Will defer surveillance to him.  I discussed in depth with the patient the nature of atherosclerosis, and emphasized the importance of maximal medical management including strict control of blood pressure, blood glucose, and lipid levels, antiplatelet agents, obtaining regular exercise, and cessation of smoking.    The patient is aware that without maximal medical management the underlying atherosclerotic disease process will progress, limiting the benefit of any interventions. The patient is currently on a statin:  Crestor.  The patient is currently not on an anti-platelet due to bleeding risks related to use of an anticoagulant.  Pt is on Eliquis  Thank you for allowing [place] to participate in this patient's care.   Adele Barthel, MD, FACS [facility] Office: [phone] Pager: [phone]

## 2017-02-26 ENCOUNTER — Encounter: Payer: Self-pay | Admitting: Vascular Surgery

## 2017-02-26 ENCOUNTER — Ambulatory Visit (HOSPITAL_COMMUNITY)
Admission: RE | Admit: 2017-02-26 | Discharge: 2017-02-26 | Disposition: A | Discharge status: Home / Self Care | Payer: Medicare Other | Source: Ambulatory Visit | Attending: Vascular Surgery | Admitting: Vascular Surgery

## 2017-02-26 ENCOUNTER — Ambulatory Visit (INDEPENDENT_AMBULATORY_CARE_PROVIDER_SITE_OTHER): Payer: Medicare Other | Admitting: Vascular Surgery

## 2017-02-26 VITALS — BP 143/81 | HR 66 | Resp 20 | Ht 67.0 in | Wt 179.2 lb

## 2017-02-26 DIAGNOSIS — I6523 Occlusion and stenosis of bilateral carotid arteries: Secondary | ICD-10-CM | POA: Diagnosis not present

## 2017-02-26 DIAGNOSIS — I779 Disorder of arteries and arterioles, unspecified: Secondary | ICD-10-CM

## 2017-02-26 DIAGNOSIS — I10 Essential (primary) hypertension: Secondary | ICD-10-CM | POA: Insufficient documentation

## 2017-02-26 DIAGNOSIS — E785 Hyperlipidemia, unspecified: Secondary | ICD-10-CM | POA: Diagnosis not present

## 2017-02-26 DIAGNOSIS — I739 Peripheral vascular disease, unspecified: Secondary | ICD-10-CM | POA: Diagnosis not present

## 2017-02-26 LAB — VAS US CAROTID
LCCADDIAS: -18 cm/s
LCCAPDIAS: 21 cm/s
LCCAPSYS: 120 cm/s
LEFT ECA DIAS: -18 cm/s
LEFT VERTEBRAL DIAS: 0 cm/s
LICADDIAS: -23 cm/s
Left CCA dist sys: -112 cm/s
Left ICA dist sys: -91 cm/s
Left ICA prox dias: -21 cm/s
Left ICA prox sys: -92 cm/s
RCCAPSYS: 129 cm/s
RIGHT CCA MID DIAS: 22 cm/s
RIGHT ECA DIAS: -16 cm/s
RIGHT VERTEBRAL DIAS: 16 cm/s
Right CCA prox dias: 23 cm/s
Right cca dist sys: -103 cm/s

## 2017-03-03 DIAGNOSIS — J320 Chronic maxillary sinusitis: Secondary | ICD-10-CM | POA: Diagnosis not present

## 2017-03-29 DIAGNOSIS — Z882 Allergy status to sulfonamides status: Secondary | ICD-10-CM | POA: Diagnosis not present

## 2017-03-29 DIAGNOSIS — Z7901 Long term (current) use of anticoagulants: Secondary | ICD-10-CM | POA: Diagnosis not present

## 2017-03-29 DIAGNOSIS — Z885 Allergy status to narcotic agent status: Secondary | ICD-10-CM | POA: Diagnosis not present

## 2017-03-29 DIAGNOSIS — Z888 Allergy status to other drugs, medicaments and biological substances status: Secondary | ICD-10-CM | POA: Diagnosis not present

## 2017-03-29 DIAGNOSIS — J3489 Other specified disorders of nose and nasal sinuses: Secondary | ICD-10-CM | POA: Diagnosis not present

## 2017-03-29 DIAGNOSIS — J320 Chronic maxillary sinusitis: Secondary | ICD-10-CM | POA: Diagnosis not present

## 2017-03-29 DIAGNOSIS — Z886 Allergy status to analgesic agent status: Secondary | ICD-10-CM | POA: Diagnosis not present

## 2017-03-29 DIAGNOSIS — R0981 Nasal congestion: Secondary | ICD-10-CM | POA: Diagnosis not present

## 2017-04-03 DIAGNOSIS — H6092 Unspecified otitis externa, left ear: Secondary | ICD-10-CM | POA: Diagnosis not present

## 2017-04-06 ENCOUNTER — Ambulatory Visit (INDEPENDENT_AMBULATORY_CARE_PROVIDER_SITE_OTHER): Payer: Medicare Other | Admitting: Cardiovascular Disease

## 2017-04-06 ENCOUNTER — Encounter: Payer: Self-pay | Admitting: Cardiovascular Disease

## 2017-04-06 VITALS — BP 130/50 | HR 60 | Ht 67.0 in | Wt 183.0 lb

## 2017-04-06 DIAGNOSIS — E785 Hyperlipidemia, unspecified: Secondary | ICD-10-CM

## 2017-04-06 DIAGNOSIS — I2510 Atherosclerotic heart disease of native coronary artery without angina pectoris: Secondary | ICD-10-CM

## 2017-04-06 DIAGNOSIS — I6523 Occlusion and stenosis of bilateral carotid arteries: Secondary | ICD-10-CM | POA: Diagnosis not present

## 2017-04-06 DIAGNOSIS — I739 Peripheral vascular disease, unspecified: Secondary | ICD-10-CM

## 2017-04-06 NOTE — Patient Instructions (Signed)
Medication Instructions: Your physician recommends that you continue on your current medications as directed. Please refer to the Current Medication list given to you today.  If you need a refill on your cardiac medications before your next appointment, please call your pharmacy.   Procedures/Testing: Your physician has requested that you have an ankle brachial index (ABI) in April. During this test an ultrasound and blood pressure cuff are used to evaluate the arteries that supply the arms and legs with blood. Allow thirty minutes for this exam. There are no restrictions or special instructions. This will take place at [place], [address].  Your physician has requested that you have a lower extremity arterial duplex in April During this test, ultrasound is used to evaluate arterial blood flow in the legs. Allow one hour for this exam. There are no restrictions or special instructions. This will take place at [place], [address].   Follow-Up: Your physician wants you to follow-up in April with Dr. Fletcher Anon. You will receive a reminder letter in the mail two months in advance. If you don't receive a letter, please call our office at [phone] to schedule this follow-up appointment.   Thank you for choosing [facility] at [facility]!!

## 2017-04-06 NOTE — Progress Notes (Signed)
Cardiology Office Note   Date:  04/06/2017   ID:  Tristan, [name] 04-28-1940, MRN [id number]  PCP:  Tristan Shin, MD  Cardiologist:  Dr. [place] Knack  Chief Complaint  Patient presents with  . Follow-up    right leg-calf and upper thigh is giving him some troubl, no chest pain       History of Present Illness: [name] is a 77 y.o. male who is here today for a follow-up visit regarding peripheral arterial disease.  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   He was seen last year for severe bilateral calf discomfort with minimal distance walking. Noninvasive vascular evaluation showed severely reduced ABI on the right side and moderately reduced on the left side. Duplex was overall suboptimal. It did show occluded left SFA.  I proceeded with CO2 angiography in October 2018 which showed no significant aortoiliac disease.  There was heavily calcified occluded distal left SFA into the proximal left popliteal artery with three-vessel runoff below the knee.  This was treated with drug-coated balloon angioplasty and self-expanding stent placement due to flow-limiting dissection. On the right side, there was a calcified eccentric stenosis affecting the common femoral artery with borderline significant disease affecting the distal SFA and distal popliteal arteries. The patient had difficult to control bleeding from the right groin after sheath pull.    He reports no recurrent left calf claudication.  However, right calf claudication has worsened since last visit and it is happening after he walks to his mailbox.  No chest pain.  Stable exertional dyspnea.  He is also having symptoms of upper respiratory tract infection with sinus congestion.   Past Medical History:  Diagnosis Date  . Bruises easily    d/t being on Eliquis  . Carotid artery disease ([place])    a. Duplex 05/2014: 83-38% RICA,  2-50% LICA, elevated velocities in right subclavian artery, normal left subclavian artery..  . Chronic back pain    HNP  . Chronic kidney disease (CKD)   . Claustrophobia    takes Ativan as needed  . 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 Coronary atherosclerosis of unspecified type of vessel, native or graft    a. cath in 2003 showed 10-20% LM, scattered 20% prox-mid LAD, 30% more distal LAD, 50-60% stenosis of LAD towards apex, 20% Cx, 30% dRCA, focal 95% stenosis in smaller of 2 branches of PDA, EF 60-65%.  . Diabetes mellitus ([place])   . Diarrhea    takes Imodium daily as needed  . Diverticulosis   . Diverticulosis of colon (without mention of hemorrhage)   . DJD (degenerative joint disease)   . DVT (deep venous thrombosis) ([place]) 07/2011  . Esophageal stricture   . Essential hypertension    takes Imdur and Lisinopril daily  . Gastric polyps    benign  . GERD (gastroesophageal reflux disease)    takes Nexium daily  . Glaucoma    uses eye drops  . History of bronchitis    not sure when the last time  . History of colon polyps    benign  . History of kidney stones   . History of kidney stones   . Insomnia    takes Melatonin nightly as needed  . Mixed hyperlipidemia    takes Crestor daily  . Myasthenia gravis ([place])    uses prednisone  . Osteoporosis 2016  .  Peripheral edema    takes Lasix daily  . Pituitary tumor    checks Dr[facility]Walden at [facility] checks it every Jan   . Pneumonia 01/2016  . PONV (postoperative nausea and vomiting)   . Ptosis   . Pulmonary embolus ([place]) 2012  . Stroke [facility]) 06/2015    Past Surgical History:  Procedure Laterality Date  . ABDOMINAL AORTOGRAM W/LOWER EXTREMITY N/A 11/18/2016   Procedure: ABDOMINAL AORTOGRAM W/LOWER EXTREMITY;  Surgeon: Tristan Hampshire, MD;  Location: [place] [facility];  Service: Cardiovascular;  Laterality: N/A;  . BACK SURGERY     x2, lumbar and cervical  . CARDIAC CATHETERIZATION    .  CATARACT EXTRACTION Bilateral 12/12  . COLONOSCOPY    . ESOPHAGOGASTRODUODENOSCOPY ENDOSCOPY  04/2015  . IR GENERIC HISTORICAL  03/03/2016   IR IVC FILTER PLMT / S&I [name] GUID/MOD SED 03/03/2016 Tristan Keen, MD [facility]  . IR GENERIC HISTORICAL  04/09/2016   IR RADIOLOGIST EVAL & MGMT 04/09/2016 Tristan Keen, MD [facility] INTER[facility] RAD  . IR GENERIC HISTORICAL  04/30/2016   IR IVC FILTER RETRIEVAL / S&I [name] GUID/MOD SED 04/30/2016 Tristan Keen, MD [facility]INTER[facility] RAD  . LITHOTRIPSY    . LUMBAR LAMINECTOMY/DECOMPRESSION MICRODISCECTOMY Right 09/14/2012   Procedure: Right Lumbar three-four, four-five, Lumbar five-Sacral one decompressive laminectomy;  Surgeon: Tristan Moore, MD;  Location: [place] [facility];  Service: Neurosurgery;  Laterality: Right;  . LUMBAR LAMINECTOMY/DECOMPRESSION MICRODISCECTOMY Left 03/11/2016   Procedure: Left Lumbar Two-ThreeMicrodiscectomy;  Surgeon: Tristan Moore, MD;  Location: [place];  Service: Neurosurgery;  Laterality: Left;  . PERIPHERAL VASCULAR INTERVENTION  11/18/2016   Procedure: PERIPHERAL VASCULAR INTERVENTION;  Surgeon: Tristan Hampshire, MD;  Location: [place] [facility];  Service: Cardiovascular;;  Left popliteal  . SHOULDER SURGERY Left 05/07/2009  . TONSILLECTOMY       Current Outpatient Medications  Medication Sig Dispense Refill  . alum & mag hydroxide-simeth (MAALOX/MYLANTA) 200-200-20 MG/5ML suspension Take 15 mLs by mouth every 6 (six) hours as needed for indigestion or heartburn.    . brimonidine-timolol (COMBIGAN) 0.2-0.5 % ophthalmic solution Place 1 drop into both eyes every 12 (twelve) hours.    . cholecalciferol (VITAMIN D) 1000 units tablet Take 1,000 Units by mouth daily.     Marland Kitchen ELIQUIS 5 MG TABS tablet TAKE 1 TABLET (5 MG TOTAL) BY MOUTH 2 (TWO) TIMES DAILY. 60 tablet 3  . furosemide (LASIX) 20 MG tablet TAKE 1 TABLET BY MOUTH DAILY. 90 tablet 2  . imatinib (GLEEVEC) 100 MG tablet Take 300 mg by mouth daily at 12 noon. Take with meals and large glass of  water.Caution:Chemotherapy     . isosorbide mononitrate (IMDUR) 30 MG 24 hr tablet Take 1 tablet (30 mg total) by mouth daily. 90 tablet 3  . lidocaine (LIDODERM) 5 % Place 1 patch onto the skin daily as needed (pain). Remove & Discard patch within 12 hours or as directed by MD    . lisinopril (PRINIVIL,ZESTRIL) 5 MG tablet Take 1 tablet (5 mg total) by mouth daily. 30 tablet 11  . loperamide (IMODIUM) 2 MG capsule Take 2-4 mg by mouth 3 (three) times daily as needed for diarrhea or loose stools (takes 1-2 tablets every and additional if neded). For diarrhea    . LORazepam (ATIVAN) 0.5 MG tablet TAKE 1 TABLET BY MOUTH AT BEDTIME AS NEEDED FOR SLEEP 30 tablet 3  . Melatonin 3 MG TABS Take 3 mg by mouth at bedtime as needed (for sleep).     Marland Kitchen  Multiple Vitamin (MULTIVITAMIN) tablet Take 1 tablet by mouth daily.    Marland Kitchen NITROSTAT 0.4 MG SL tablet PLACE 1 TABLET UNDER THE TONGUE EVERY 5 MINUTES X3 DOSES AS NEEDED FOR CHEST PAIN 100 tablet 1  . ondansetron (ZOFRAN ODT) 8 MG disintegrating tablet Take 1 tablet (8 mg total) by mouth every 8 (eight) hours as needed for nausea or vomiting. 90 tablet 1  . predniSONE (DELTASONE) 1 MG tablet Take 4 tablets (4 mg total) by mouth daily with breakfast. 360 tablet 1  . promethazine (PHENERGAN) 12.5 MG tablet Take 1 tablet (12.5 mg total) by mouth every 6 (six) hours as needed for nausea or vomiting. 30 tablet 0  . pyridostigmine (MESTINON) 60 MG tablet Take 60 mg by mouth daily.    . ranitidine (ZANTAC) 150 MG tablet Take 1 tablet (150 mg total) by mouth 2 (two) times daily. Take 1 to 2 tablets by mouth as needed 90 tablet 3  . rosuvastatin (CRESTOR) 10 MG tablet Take 5 mg by mouth daily.    . traMADol (ULTRAM) 50 MG tablet Take 50 mg by mouth every 6 (six) hours as needed (pain).     No current facility-administered medications for this visit.     Allergies:   Phenergan [promethazine hcl]; Zofran [ondansetron hcl]; and Sulfamethoxazole    Social History:  The  patient  reports that  has never smoked. he has never used smokeless tobacco. He reports that he does not drink alcohol or use drugs.   Family History:  The patient's family history includes Breast cancer in his sister; Clotting disorder in his sister; Dementia in his sister; Heart attack in his father, mother, sister, sister, and sister; Heart disease in his father, mother, and sister; Stroke in his father and mother.    ROS:  Please see the history of present illness.   Otherwise, review of systems are positive for none.   All other systems are reviewed and negative.    PHYSICAL EXAM: VS:  BP (!) 130/50   Pulse 60   Ht 5\' 7"  (1.702 m)   Wt 183 lb (83 kg)   BMI 28.66 kg/m  , BMI Body mass index is 28.66 kg/m. GEN: Well nourished, well developed, in no acute distress  HEENT: normal  Neck: no JVD, carotid bruits, or masses Cardiac: RRR; no murmurs, rubs, or gallops,no edema  Respiratory:  clear to auscultation bilaterally, normal work of breathing GI: soft, nontender, nondistended, + BS MS: no deformity or atrophy  Skin: warm and dry, no rash Neuro:  Strength and sensation are intact Psych: euthymic mood, full affect Vascular: Femoral pulses +2 bilaterally.    EKG:  EKG is ordered today. EKG showed normal sinus rhythm with possible old inferior infarct.  Recent Labs: 07/02/2016: ALT 24; TSH 1.996 07/03/2016: Magnesium 1.8 11/19/2016: BUN 13; Creatinine, Ser 1.53; Hemoglobin 10.5; Platelets 160; Potassium 4.4; Sodium 137    Lipid Panel    Component Value Date/Time   CHOL 104 07/03/2016 0403   TRIG 91 07/03/2016 0403   HDL 33 (L) 07/03/2016 0403   CHOLHDL 3.2 07/03/2016 0403   VLDL 18 07/03/2016 0403   LDLCALC 53 07/03/2016 0403   LDLDIRECT 59.8 10/12/2012 0737      Wt Readings from Last 3 Encounters:  04/06/17 183 lb (83 kg)  02/26/17 179 lb 3.2 oz (81.3 kg)  02/20/17 181 lb (82.1 kg)       No flowsheet data found.    ASSESSMENT AND PLAN:  1.  Peripheral  arterial disease :   Status post left SFA drug-coated balloon angioplasty and self-expanding stent placement with resolution of left calf claudication.   Worsening right calf claudication with known disease affecting the right common femoral, SFA and popliteal arteries.  The patient will likely require repeat angiography on the right side but he wants to wait until symptoms worsen. I will bring him back in April for an ABI and repeat arterial duplex and we will see him at that time to determine the need for repeat angiography.  It is possible that the disease in the right common femoral artery might not be significant given that his pulse is normal in that area.  2. Coronary artery disease involving native coronary arteries without angina: Continue medical therapy.  3. CML: Currently on Gleevec.  4. Hyperlipidemia: Continue treatment with rosuvastatin with a target LDL of less than 70.  I advised him to follow-up with his primary care physician or go to urgent care for evaluation of possible ear infection.  Disposition:   FU with me in 2 months  Signed,  [name], MD  04/06/2017 11:52 AM    [place]

## 2017-04-07 ENCOUNTER — Ambulatory Visit (INDEPENDENT_AMBULATORY_CARE_PROVIDER_SITE_OTHER): Payer: Medicare Other | Admitting: Endocrinology

## 2017-04-07 ENCOUNTER — Encounter: Payer: Self-pay | Admitting: Endocrinology

## 2017-04-07 ENCOUNTER — Encounter: Payer: Self-pay | Admitting: Cardiovascular Disease

## 2017-04-07 VITALS — BP 108/50 | HR 66 | Ht 67.0 in | Wt 183.8 lb

## 2017-04-07 DIAGNOSIS — I6523 Occlusion and stenosis of bilateral carotid arteries: Secondary | ICD-10-CM | POA: Diagnosis not present

## 2017-04-07 DIAGNOSIS — E1159 Type 2 diabetes mellitus with other circulatory complications: Secondary | ICD-10-CM | POA: Diagnosis not present

## 2017-04-07 LAB — POCT GLYCOSYLATED HEMOGLOBIN (HGB A1C): Hemoglobin A1C: 5.9

## 2017-04-07 MED ORDER — CEPHALEXIN 500 MG PO CAPS: 500.0000 mg | ORAL_CAPSULE | Freq: Three times a day (TID) | ORAL | 0 refills | Status: DC

## 2017-04-07 MED ORDER — FLUCONAZOLE 150 MG PO TABS: 150.0000 mg | ORAL_TABLET | Freq: Every day | ORAL | 0 refills | Status: DC

## 2017-04-07 NOTE — Progress Notes (Signed)
Subjective:    Patient ID: [name], male    DOB: 01/04/1941, 77 y.o.   MRN: [id number]  HPI Pt states 10 days of moderate pain at the left ear, and assoc itching.  He was rx'ed cortisporin otic, without relief.  Past Medical History:  Diagnosis Date  . Bruises easily    d/t being on Eliquis  . Carotid artery disease ([place])    a. Duplex 05/2014: 32-67% RICA, 1-24% LICA, elevated velocities in right subclavian artery, normal left subclavian artery..  . Chronic back pain    HNP  . Chronic kidney disease (CKD)   . Claustrophobia    takes Ativan as needed  . 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 Coronary atherosclerosis of unspecified type of vessel, native or graft    a. cath in 2003 showed 10-20% LM, scattered 20% prox-mid LAD, 30% more distal LAD, 50-60% stenosis of LAD towards apex, 20% Cx, 30% dRCA, focal 95% stenosis in smaller of 2 branches of PDA, EF 60-65%.  . Diabetes mellitus ([place])   . Diarrhea    takes Imodium daily as needed  . Diverticulosis   . Diverticulosis of colon (without mention of hemorrhage)   . DJD (degenerative joint disease)   . DVT (deep venous thrombosis) ([place]) 07/2011  . Esophageal stricture   . Essential hypertension    takes Imdur and Lisinopril daily  . Gastric polyps    benign  . GERD (gastroesophageal reflux disease)    takes Nexium daily  . Glaucoma    uses eye drops  . History of bronchitis    not sure when the last time  . History of colon polyps    benign  . History of kidney stones   . History of kidney stones   . Insomnia    takes Melatonin nightly as needed  . Mixed hyperlipidemia    takes Crestor daily  . Myasthenia gravis ([place])    uses prednisone  . Osteoporosis 2016  . Peripheral edema    takes Lasix daily  . Pituitary tumor    checks Dr[facility]Walden at [facility] checks it every Jan   . Pneumonia 01/2016  . PONV (postoperative nausea and vomiting)   . Ptosis   . Pulmonary embolus ([place]) 2012  .  Stroke [facility]) 06/2015    Past Surgical History:  Procedure Laterality Date  . ABDOMINAL AORTOGRAM W/LOWER EXTREMITY N/A 11/18/2016   Procedure: ABDOMINAL AORTOGRAM W/LOWER EXTREMITY;  Surgeon: [name], MD;  Location: [place] [facility];  Service: Cardiovascular;  Laterality: N/A;  . BACK SURGERY     x2, lumbar and cervical  . CARDIAC CATHETERIZATION    . CATARACT EXTRACTION Bilateral 12/12  . COLONOSCOPY    . ESOPHAGOGASTRODUODENOSCOPY ENDOSCOPY  04/2015  . IR GENERIC HISTORICAL  03/03/2016   IR IVC FILTER PLMT / S&I [name] GUID/MOD SED 03/03/2016 [name], MD [facility]  . IR GENERIC HISTORICAL  04/09/2016   IR RADIOLOGIST EVAL & MGMT 04/09/2016 [name], MD [facility]  . IR GENERIC HISTORICAL  04/30/2016   IR IVC FILTER RETRIEVAL / S&I [name] GUID/MOD SED 04/30/2016 [name], MD [facility]  . LITHOTRIPSY    . LUMBAR LAMINECTOMY/DECOMPRESSION MICRODISCECTOMY Right 09/14/2012   Procedure: Right Lumbar three-four, four-five, Lumbar five-Sacral one decompressive laminectomy;  Surgeon: [name], MD;  Location: [place] [facility];  Service: Neurosurgery;  Laterality: Right;  . LUMBAR LAMINECTOMY/DECOMPRESSION MICRODISCECTOMY Left 03/11/2016   Procedure: Left Lumbar  Two-ThreeMicrodiscectomy;  Surgeon: [name], MD;  Location: [place];  Service: Neurosurgery;  Laterality: Left;  . PERIPHERAL VASCULAR INTERVENTION  11/18/2016   Procedure: PERIPHERAL VASCULAR INTERVENTION;  Surgeon: [name], MD;  Location: [place] [facility];  Service: Cardiovascular;;  Left popliteal  . SHOULDER SURGERY Left 05/07/2009  . TONSILLECTOMY      Social History   Socioeconomic History  . Marital status: Married    Spouse name: Hassan Rowan  . Number of children: 2  . Years of education: 1-College  . Highest education level: Not on file  Social Needs  . Financial resource strain: Not on file  . Food insecurity - worry: Not on file  . Food insecurity - inability: Not on file  .  Transportation needs - medical: Not on file  . Transportation needs - non-medical: Not on file  Occupational History  . Occupation: retired    Fish farm manager: RETIRED    Comment: Retired  Tobacco Use  . Smoking status: Never Smoker  . Smokeless tobacco: Never Used  Substance and Sexual Activity  . Alcohol use: No    Alcohol/week: 0.0 oz  . Drug use: No  . Sexual activity: Not Currently  Other Topics Concern  . Not on file  Social History Narrative   Lives at home w/ his wife   Patient is right handed.   Patient has 1-2 cups of caffeine daily.    Current Outpatient Medications on File Prior to Visit  Medication Sig Dispense Refill  . alum & mag hydroxide-simeth (MAALOX/MYLANTA) 200-200-20 MG/5ML suspension Take 15 mLs by mouth every 6 (six) hours as needed for indigestion or heartburn.    . brimonidine-timolol (COMBIGAN) 0.2-0.5 % ophthalmic solution Place 1 drop into both eyes every 12 (twelve) hours.    . cholecalciferol (VITAMIN D) 1000 units tablet Take 1,000 Units by mouth daily.     Marland Kitchen ELIQUIS 5 MG TABS tablet TAKE 1 TABLET (5 MG TOTAL) BY MOUTH 2 (TWO) TIMES DAILY. 60 tablet 3  . furosemide (LASIX) 20 MG tablet TAKE 1 TABLET BY MOUTH DAILY. 90 tablet 2  . imatinib (GLEEVEC) 100 MG tablet Take 300 mg by mouth daily at 12 noon. Take with meals and large glass of water.Caution:Chemotherapy     . isosorbide mononitrate (IMDUR) 30 MG 24 hr tablet Take 1 tablet (30 mg total) by mouth daily. 90 tablet 3  . lidocaine (LIDODERM) 5 % Place 1 patch onto the skin daily as needed (pain). Remove & Discard patch within 12 hours or as directed by MD    . lisinopril (PRINIVIL,ZESTRIL) 5 MG tablet Take 1 tablet (5 mg total) by mouth daily. 30 tablet 11  . loperamide (IMODIUM) 2 MG capsule Take 2-4 mg by mouth 3 (three) times daily as needed for diarrhea or loose stools (takes 1-2 tablets every and additional if neded). For diarrhea    . LORazepam (ATIVAN) 0.5 MG tablet TAKE 1 TABLET BY MOUTH AT  BEDTIME AS NEEDED FOR SLEEP 30 tablet 3  . Melatonin 3 MG TABS Take 3 mg by mouth at bedtime as needed (for sleep).     . Multiple Vitamin (MULTIVITAMIN) tablet Take 1 tablet by mouth daily.    . NEOMYCIN-POLYMYXIN-HYDROCORTISONE (CORTISPORIN) 1 % SOLN OTIC solution INSTILL 4 DROPS INTO LEFT EAR EVERY 6 HOURS FOR 7 DAYS  0  . NITROSTAT 0.4 MG SL tablet PLACE 1 TABLET UNDER THE TONGUE EVERY 5 MINUTES X3 DOSES AS NEEDED FOR CHEST PAIN 100 tablet 1  .  ondansetron (ZOFRAN ODT) 8 MG disintegrating tablet Take 1 tablet (8 mg total) by mouth every 8 (eight) hours as needed for nausea or vomiting. 90 tablet 1  . predniSONE (DELTASONE) 1 MG tablet Take 4 tablets (4 mg total) by mouth daily with breakfast. 360 tablet 1  . promethazine (PHENERGAN) 12.5 MG tablet Take 1 tablet (12.5 mg total) by mouth every 6 (six) hours as needed for nausea or vomiting. 30 tablet 0  . pyridostigmine (MESTINON) 60 MG tablet Take 60 mg by mouth daily.    . ranitidine (ZANTAC) 150 MG tablet Take 1 tablet (150 mg total) by mouth 2 (two) times daily. Take 1 to 2 tablets by mouth as needed 90 tablet 3  . rosuvastatin (CRESTOR) 10 MG tablet Take 5 mg by mouth daily.    . traMADol (ULTRAM) 50 MG tablet Take 50 mg by mouth every 6 (six) hours as needed (pain).    . [DISCONTINUED] zolpidem (AMBIEN) 10 MG tablet Take 10 mg by mouth at bedtime as needed.      No current facility-administered medications on file prior to visit.     Allergies  Allergen Reactions  . Phenergan [Promethazine Hcl] Swelling    Cannot be combined with Zofran because swelling of the eyes resulted and patient became very restless-  . Zofran [Ondansetron Hcl] Other (See Comments)    Cannot be combined with Phenergan because swelling of the eyes resulted and patient became very restless-  . Sulfamethoxazole Rash and Itching    Family History  Problem Relation Age of Onset  . Heart disease Mother   . Heart attack Mother   . Stroke Mother   . Heart  disease Father   . Heart attack Father   . Stroke Father   . Breast cancer Sister        Twin   . Heart attack Sister   . Dementia Sister   . Heart disease Sister   . Heart attack Sister   . Clotting disorder Sister   . Heart attack Sister   . Colon cancer Neg Hx     BP (!) 108/50 (BP Location: Right Arm, Patient Position: Sitting, Cuff Size: Normal)   Pulse 66   Ht 5\' 7"  (1.702 m)   Wt 183 lb 12.8 oz (83.4 kg)   SpO2 98%   BMI 28.79 kg/m    Review of Systems Denies fever.  No change in chronic hearing loss.    Objective:   Physical Exam VITAL SIGNS:  See vs page GENERAL: no distress Left EAC: slight erythema and white d/c.  Pulses: dorsalis pedis intact bilat.   MSK: no deformity of the feet CV: trace bilat leg edema, and bilat vv's.  Skin:  no ulcer on the feet.  normal color and temp on the feet. Neuro: sensation is intact to touch on the feet Ext: onychomycosis of toenails     Assessment & Plan:  AOE, persistent.  white d/c could be medication.   I have sent 2 prescriptions to your pharmacy: antibiotic and pill against yeast infection.  Please continue the same drops.    Subjective:   Patient here for Medicare annual wellness visit and management of other chronic and acute problems.     Risk factors: advanced age    82 of Physicians Providing Medical Care to Patient:  See "snapshot"   Activities of Daily Living: In your present state of health, do you have any difficulty performing the following activities (lives with wife)?:  Preparing  food and eating?: No  Bathing yourself: No  Getting dressed: No  Using the toilet:No  Moving around from place to place: No  In the past year have you fallen or had a near fall?:No    Home Safety: Has smoke detector and wears seat belts. Firearms.are safely stored.  No excess sun exposure.  Opioid Use: none  Diet and Exercise  Current exercise habits: pt says not good Dietary issues discussed: pt says he does  not have a healthy diet   Depression Screen  Q1: Over the past two weeks, have you felt down, depressed or hopeless? no  Q2: Over the past two weeks, have you felt little interest or pleasure in doing things? no   The following portions of the patient's history were reviewed and updated as appropriate: allergies, current medications, past family history, past medical history, past social history, past surgical history and problem list.   Review of Systems  No change in chronic hearing and visual losses Objective:   Vision:  Advertising account executive, so he declines VA today Hearing: grossly normal Body mass index:  See vs page Msk: pt easily and quickly performs "get-up-and-go" from a sitting position Cognitive Impairment Assessment: cognition, memory and judgment appear normal.  remembers 3/3 at 5 minutes.  excellent recall.  can easily read and write a sentence.  alert and oriented x 3.     Assessment:   Medicare wellness utd on preventive parameters    Plan:   During the course of the visit the patient was educated and counseled about appropriate screening and preventive services including:        Fall prevention is advised today  Diabetes screening is done Nutrition counseling is offered  advanced directives/end of life addressed today:  see healthcare directives hyperlink  Vaccines are updated as needed  Patient Instructions (the written plan) was given to the patient.

## 2017-04-07 NOTE — Patient Instructions (Addendum)
I have sent 2 prescriptions to your pharmacy: antibiotic and pill against yeast infection.  Please continue the same drops.  I hope you feel better soon.  If you don't feel better by next week, please call back.  Please call sooner if you get worse.  good diet and exercise significantly improve the control of your diabetes.  please let me know if you wish to be referred to a dietician.  high blood sugar is very risky to your health.  you should see an eye doctor and dentist every year.  It is very important to get all recommended vaccinations.  Please come back for a follow-up appointment in 6 months.

## 2017-04-19 ENCOUNTER — Ambulatory Visit (INDEPENDENT_AMBULATORY_CARE_PROVIDER_SITE_OTHER): Payer: Medicare Other | Admitting: Cardiovascular Disease

## 2017-04-19 ENCOUNTER — Encounter: Payer: Self-pay | Admitting: Cardiovascular Disease

## 2017-04-19 VITALS — BP 104/60 | HR 64 | Ht 67.0 in | Wt 181.8 lb

## 2017-04-19 DIAGNOSIS — E785 Hyperlipidemia, unspecified: Secondary | ICD-10-CM

## 2017-04-19 DIAGNOSIS — I10 Essential (primary) hypertension: Secondary | ICD-10-CM

## 2017-04-19 DIAGNOSIS — I6523 Occlusion and stenosis of bilateral carotid arteries: Secondary | ICD-10-CM | POA: Diagnosis not present

## 2017-04-19 DIAGNOSIS — I2510 Atherosclerotic heart disease of native coronary artery without angina pectoris: Secondary | ICD-10-CM | POA: Diagnosis not present

## 2017-04-19 NOTE — Progress Notes (Signed)
Cardiology Office Note Date:  04/25/2017   ID:  [name], DOB January 28, 1941, MRN [id number]  PCP:  Renato Shin, MD  Cardiologist:  Sherren Mocha, MD    Chief Complaint  Patient presents with  . Follow-up    CAD     History of Present Illness: [name] is a 77 y.o. male who presents for follow-up evaluation. Patient has a history of coronary artery disease with distal vessel disease pattern by cardiac catheterization in 2003. Medical therapy was recommended at that time. 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  He is herewith his wife today. Overall reports stable symptoms of right leg claudication with modest degrees of activity, no rest pain. Left leg is improved since undergoing angioplasty by Dr Fletcher Anon last year. No chest pain or shortness of breath. He is concerned because his BP has been difficult to take recently and readings have been lower than in the past. He denies lightheadedness, syncope. Compliant with his medications.   Past Medical History:  Diagnosis Date  . Bruises easily    d/t being on Eliquis  . Carotid artery disease ([place])    a. Duplex 05/2014: 41-96% RICA, 2-22% LICA, elevated velocities in right subclavian artery, normal left subclavian artery..  . Chronic back pain    HNP  . Chronic kidney disease (CKD)   . Claustrophobia    takes Ativan as needed  . 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 Coronary atherosclerosis of unspecified type of vessel, native or graft    a. cath in 2003 showed 10-20% LM, scattered 20% prox-mid LAD, 30% more distal LAD, 50-60% stenosis of LAD towards apex, 20% Cx, 30% dRCA, focal 95% stenosis in smaller of 2 branches of PDA, EF 60-65%.  . Diabetes mellitus ([place])   . Diarrhea    takes Imodium daily as needed  . Diverticulosis   . Diverticulosis of colon (without mention of  hemorrhage)   . DJD (degenerative joint disease)   . DVT (deep venous thrombosis) ([place]) 07/2011  . Esophageal stricture   . Essential hypertension    takes Imdur and Lisinopril daily  . Gastric polyps    benign  . GERD (gastroesophageal reflux disease)    takes Nexium daily  . Glaucoma    uses eye drops  . History of bronchitis    not sure when the last time  . History of colon polyps    benign  . History of kidney stones   . History of kidney stones   . Insomnia    takes Melatonin nightly as needed  . Mixed hyperlipidemia    takes Crestor daily  . Myasthenia gravis ([place])    uses prednisone  . Osteoporosis 2016  . Peripheral edema    takes Lasix daily  . Pituitary tumor    [facility]Walden at [facility] checks it every Jan   . Pneumonia 01/2016  . PONV (postoperative nausea and vomiting)   . Ptosis   . Pulmonary embolus ([place]) 2012  . Stroke [facility]) 06/2015    Past Surgical History:  Procedure Laterality Date  . ABDOMINAL AORTOGRAM W/LOWER EXTREMITY N/A 11/18/2016   Procedure: ABDOMINAL AORTOGRAM W/LOWER EXTREMITY;  Surgeon: [name], MD;  Location: [place] [facility];  Service: Cardiovascular;  Laterality: N/A;  . BACK SURGERY     x2, lumbar and cervical  . CARDIAC CATHETERIZATION    .  CATARACT EXTRACTION Bilateral 12/12  . COLONOSCOPY    . ESOPHAGOGASTRODUODENOSCOPY ENDOSCOPY  04/2015  . IR GENERIC HISTORICAL  03/03/2016   IR IVC FILTER PLMT / S&I [name] GUID/MOD SED 03/03/2016 [name], MD [facility]INTER[facility] RAD  . IR GENERIC HISTORICAL  04/09/2016   IR RADIOLOGIST EVAL & MGMT 04/09/2016 [name], MD [facility] RAD  . IR GENERIC HISTORICAL  04/30/2016   IR IVC FILTER RETRIEVAL / S&I [name] GUID/MOD SED 04/30/2016 [name], MD [facility]-INTER[facility] RAD  . LITHOTRIPSY    . LUMBAR LAMINECTOMY/DECOMPRESSION MICRODISCECTOMY Right 09/14/2012   Procedure: Right Lumbar three-four, four-five, Lumbar five-Sacral one decompressive laminectomy;  Surgeon: [name], MD;  Location: [place]  NEURO ORS;  Service: Neurosurgery;  Laterality: Right;  . LUMBAR LAMINECTOMY/DECOMPRESSION MICRODISCECTOMY Left 03/11/2016   Procedure: Left Lumbar Two-ThreeMicrodiscectomy;  Surgeon: [name], MD;  Location: [place];  Service: Neurosurgery;  Laterality: Left;  . PERIPHERAL VASCULAR INTERVENTION  11/18/2016   Procedure: PERIPHERAL VASCULAR INTERVENTION;  Surgeon: [name], MD;  Location: [place] [facility];  Service: Cardiovascular;;  Left popliteal  . SHOULDER SURGERY Left 05/07/2009  . TONSILLECTOMY      Current Outpatient Medications  Medication Sig Dispense Refill  . alum & mag hydroxide-simeth (MAALOX/MYLANTA) 200-200-20 MG/5ML suspension Take 15 mLs by mouth every 6 (six) hours as needed for indigestion or heartburn.    . brimonidine-timolol (COMBIGAN) 0.2-0.5 % ophthalmic solution Place 1 drop into both eyes every 12 (twelve) hours.    . cholecalciferol (VITAMIN D) 1000 units tablet Take 1,000 Units by mouth daily.     Marland Kitchen ELIQUIS 5 MG TABS tablet TAKE 1 TABLET (5 MG TOTAL) BY MOUTH 2 (TWO) TIMES DAILY. 60 tablet 3  . furosemide (LASIX) 20 MG tablet TAKE 1 TABLET BY MOUTH DAILY. 90 tablet 2  . imatinib (GLEEVEC) 100 MG tablet Take 300 mg by mouth daily at 12 noon. Take with meals and large glass of water.Caution:Chemotherapy     . isosorbide mononitrate (IMDUR) 30 MG 24 hr tablet Take 1 tablet (30 mg total) by mouth daily. 90 tablet 3  . lidocaine (LIDODERM) 5 % Place 1 patch onto the skin daily as needed (pain). Remove & Discard patch within 12 hours or as directed by MD    . lisinopril (PRINIVIL,ZESTRIL) 5 MG tablet Take 1 tablet (5 mg total) by mouth daily. 30 tablet 11  . loperamide (IMODIUM) 2 MG capsule Take 2-4 mg by mouth 3 (three) times daily as needed for diarrhea or loose stools (takes 1-2 tablets every and additional if neded). For diarrhea    . LORazepam (ATIVAN) 0.5 MG tablet TAKE 1 TABLET BY MOUTH AT BEDTIME AS NEEDED FOR SLEEP 30 tablet 3  . Melatonin 3 MG TABS  Take 3 mg by mouth at bedtime as needed (for sleep).     . Multiple Vitamin (MULTIVITAMIN) tablet Take 1 tablet by mouth daily.    Marland Kitchen NITROSTAT 0.4 MG SL tablet PLACE 1 TABLET UNDER THE TONGUE EVERY 5 MINUTES X3 DOSES AS NEEDED FOR CHEST PAIN 100 tablet 1  . ondansetron (ZOFRAN ODT) 8 MG disintegrating tablet Take 1 tablet (8 mg total) by mouth every 8 (eight) hours as needed for nausea or vomiting. 90 tablet 1  . predniSONE (DELTASONE) 1 MG tablet Take 4 tablets (4 mg total) by mouth daily with breakfast. 360 tablet 1  . promethazine (PHENERGAN) 12.5 MG tablet Take 1 tablet (12.5 mg total) by mouth every 6 (six) hours as needed for nausea or  vomiting. 30 tablet 0  . pyridostigmine (MESTINON) 60 MG tablet Take 60 mg by mouth daily.    . ranitidine (ZANTAC) 150 MG tablet Take 150 mg by mouth 2 (two) times daily as needed for heartburn.    . rosuvastatin (CRESTOR) 10 MG tablet Take 5 mg by mouth daily.    . traMADol (ULTRAM) 50 MG tablet Take 50 mg by mouth every 6 (six) hours as needed (pain).     No current facility-administered medications for this visit.     Allergies:   Phenergan [promethazine hcl]; Zofran [ondansetron hcl]; and Sulfamethoxazole   Social History:  The patient  reports that  has never smoked. he has never used smokeless tobacco. He reports that he does not drink alcohol or use drugs.   Family History:  The patient's  family history includes Breast cancer in his sister; Clotting disorder in his sister; Dementia in his sister; Heart attack in his father, mother, sister, sister, and sister; Heart disease in his father, mother, and sister; Stroke in his father and mother.    ROS:  Please see the history of present illness.  Otherwise, review of systems is positive for hearing loss, back pain, easy bruising, leg pain.  All other systems are reviewed and negative.    PHYSICAL EXAM: VS:  BP 104/60   Pulse 64   Ht 5\' 7"  (1.702 m)   Wt 181 lb 12.8 oz (82.5 kg)   BMI 28.47 kg/m   , BMI Body mass index is 28.47 kg/m. GEN: Well nourished, well developed, in no acute distress  HEENT: normal  Neck: no JVD, no masses. BL carotid bruits Cardiac: RRR without murmur or gallop                Respiratory:  clear to auscultation bilaterally, normal work of breathing GI: soft, nontender, nondistended, + BS MS: no deformity or atrophy  Ext: no pretibial edema Skin: warm and dry, no rash Neuro:  Strength and sensation are intact Psych: euthymic mood, full affect  EKG:  EKG is not ordered today.  Recent Labs: 07/02/2016: ALT 24; TSH 1.996 07/03/2016: Magnesium 1.8 11/19/2016: BUN 13; Creatinine, Ser 1.53; Hemoglobin 10.5; Platelets 160; Potassium 4.4; Sodium 137   Lipid Panel     Component Value Date/Time   CHOL 104 07/03/2016 0403   TRIG 91 07/03/2016 0403   HDL 33 (L) 07/03/2016 0403   CHOLHDL 3.2 07/03/2016 0403   VLDL 18 07/03/2016 0403   LDLCALC 53 07/03/2016 0403   LDLDIRECT 59.8 10/12/2012 0737      Wt Readings from Last 3 Encounters:  04/19/17 181 lb 12.8 oz (82.5 kg)  04/07/17 183 lb 12.8 oz (83.4 kg)  04/06/17 183 lb (83 kg)     Cardiac Studies Reviewed: Echo 07-02-2016: Left ventricle:  The cavity size was normal. There was mild focal basal hypertrophy of the septum. Systolic function was normal. The estimated ejection fraction was in the range of 55% to 60%. Wall motion was normal; there were no regional wall motion abnormalities. Doppler parameters are consistent with abnormal left ventricular relaxation (grade 1 diastolic dysfunction).  ------------------------------------------------------------------- Aortic valve:   Trileaflet; mildly calcified leaflets. Mobility was not restricted.  Doppler:  Transvalvular velocity was within the normal range. There was no stenosis. There was no regurgitation.   ------------------------------------------------------------------- Aorta:  Aortic root: The aortic root was normal in  size.  ------------------------------------------------------------------- Mitral valve:   Structurally normal valve.   Mobility was not restricted.  Doppler:  Transvalvular velocity  was within the normal range. There was no evidence for stenosis. There was no regurgitation.    Peak gradient (D): 2 mm Hg.  ------------------------------------------------------------------- Left atrium:  The atrium was normal in size.  ------------------------------------------------------------------- Right ventricle:  The cavity size was normal. Systolic function was normal.  ------------------------------------------------------------------- Pulmonic valve:    Doppler:  Transvalvular velocity was within the normal range. There was no evidence for stenosis.  ------------------------------------------------------------------- Tricuspid valve:   Structurally normal valve.    Doppler: Transvalvular velocity was within the normal range. There was trivial regurgitation.  ------------------------------------------------------------------- Right atrium:  The atrium was normal in size.  ------------------------------------------------------------------- Pericardium:  There was no pericardial effusion.  ------------------------------------------------------------------- Systemic veins: Inferior vena cava: The vessel was normal in size.  Carotid Duplex 02-26-2017: Final Interpretation: Right Carotid: Velocities in the right ICA are consistent with a 40-59%        stenosis.  Left Carotid: Velocities in the left ICA are consistent with a 1-39% stenosis.  Vertebrals: Right vertebral artery was patent with antegrade flow. No evidence       of flow detected in the left vertebral artery. Subclavians: Normal flow hemodynamics were seen in bilateral subclavian       arteries.  ASSESSMENT AND PLAN: 1.  CAD, native vessel, with angina: no symptoms on current medical therapy  (isosorbide). No antiplatelet Rx since he is anticoagulated with apixaban.   2. HTN - BP has been low recently per his report. He's treated with isosorbide, lisinopril. I personally took his BP and it is difficult to auscultate in either arm, but pulses palpable bilaterally and he has no symptoms of lightheadedness/presyncope. BP approximately 110/60 bilaterally.  3. Hyperlipidemia: treated with crestor 10 mg daily. Last lipids reviewed from 06/2016 with LDL 53 mg/dL. Repeat labs in 6 months. Lifestyle modification counseling done.   4. PAD with intermittent claudication. Left leg symptoms improved following complex endovascular treatment 11-2016. Treated with apixaban alone in setting DVT/PE. Stable right leg claudication noted. Follows closely with Dr Fletcher Anon.   Current medicines are reviewed with the patient today.  The patient does not have concerns regarding medicines.  Labs/ tests ordered today include:   Orders Placed This Encounter  Procedures  . Comprehensive metabolic panel  . Lipid panel    Disposition:   FU 6 months with labs  Signed, Sherren Mocha, MD  04/25/2017 8:57 AM    [facility] [address], [place], [place]  [address] Phone: [phone]; Fax: [phone]

## 2017-04-19 NOTE — Patient Instructions (Signed)
Medication Instructions:  Your provider recommends that you continue on your current medications as directed. Please refer to the Current Medication list given to you today.    Labwork: Your provider recommends that you return for FASTING lab work in: 6 months, about 1 week prior to your appointment with Dr. Burt Knack.    Testing/Procedures: None  Follow-Up: Your provider wants you to follow-up in: 6 months with Dr. Burt Knack. You will receive a reminder letter in the mail two months in advance. If you don't receive a letter, please call our office to schedule the follow-up appointment.    Any Other Special Instructions Will Be Listed Below (If Applicable).     If you need a refill on your cardiac medications before your next appointment, please call your pharmacy.

## 2017-04-20 DIAGNOSIS — H401131 Primary open-angle glaucoma, bilateral, mild stage: Secondary | ICD-10-CM | POA: Diagnosis not present

## 2017-04-27 DIAGNOSIS — C9211 Chronic myeloid leukemia, BCR/ABL-positive, in remission: Secondary | ICD-10-CM | POA: Diagnosis not present

## 2017-04-27 DIAGNOSIS — K056 Periodontal disease, unspecified: Secondary | ICD-10-CM | POA: Diagnosis not present

## 2017-04-27 DIAGNOSIS — Z9889 Other specified postprocedural states: Secondary | ICD-10-CM | POA: Diagnosis not present

## 2017-04-27 DIAGNOSIS — D539 Nutritional anemia, unspecified: Secondary | ICD-10-CM | POA: Diagnosis not present

## 2017-04-27 DIAGNOSIS — Z86711 Personal history of pulmonary embolism: Secondary | ICD-10-CM | POA: Diagnosis not present

## 2017-04-27 DIAGNOSIS — M810 Age-related osteoporosis without current pathological fracture: Secondary | ICD-10-CM | POA: Diagnosis not present

## 2017-04-27 DIAGNOSIS — R197 Diarrhea, unspecified: Secondary | ICD-10-CM | POA: Diagnosis not present

## 2017-04-27 DIAGNOSIS — Z8673 Personal history of transient ischemic attack (TIA), and cerebral infarction without residual deficits: Secondary | ICD-10-CM | POA: Diagnosis not present

## 2017-04-27 DIAGNOSIS — I739 Peripheral vascular disease, unspecified: Secondary | ICD-10-CM | POA: Diagnosis not present

## 2017-04-27 DIAGNOSIS — Z7952 Long term (current) use of systemic steroids: Secondary | ICD-10-CM | POA: Diagnosis not present

## 2017-04-27 DIAGNOSIS — D708 Other neutropenia: Secondary | ICD-10-CM | POA: Diagnosis not present

## 2017-04-27 DIAGNOSIS — Z7901 Long term (current) use of anticoagulants: Secondary | ICD-10-CM | POA: Diagnosis not present

## 2017-04-27 DIAGNOSIS — G7000 Myasthenia gravis without (acute) exacerbation: Secondary | ICD-10-CM | POA: Diagnosis not present

## 2017-04-27 DIAGNOSIS — Z79899 Other long term (current) drug therapy: Secondary | ICD-10-CM | POA: Diagnosis not present

## 2017-04-30 ENCOUNTER — Encounter: Payer: Self-pay | Admitting: Endocrinology

## 2017-04-30 ENCOUNTER — Ambulatory Visit (INDEPENDENT_AMBULATORY_CARE_PROVIDER_SITE_OTHER): Payer: Medicare Other | Admitting: Endocrinology

## 2017-04-30 DIAGNOSIS — M7032 Other bursitis of elbow, left elbow: Secondary | ICD-10-CM | POA: Diagnosis not present

## 2017-04-30 DIAGNOSIS — I6523 Occlusion and stenosis of bilateral carotid arteries: Secondary | ICD-10-CM | POA: Diagnosis not present

## 2017-04-30 MED ORDER — AMOXICILLIN-POT CLAVULANATE 875-125 MG PO TABS: 1.0000 | ORAL_TABLET | Freq: Two times a day (BID) | ORAL | 0 refills | Status: AC

## 2017-04-30 NOTE — Progress Notes (Signed)
Subjective:    Patient ID: [name], male    DOB: 1940-11-18, 77 y.o.   MRN: [id number]  HPI 2 weeks of moderate swelling at the left elbow, and assoc pain.  No local injury Past Medical History:  Diagnosis Date  . Bruises easily    d/t being on Eliquis  . Carotid artery disease ([place])    a. Duplex 05/2014: 78-67% RICA, 6-72% LICA, elevated velocities in right subclavian artery, normal left subclavian artery..  . Chronic back pain    HNP  . Chronic kidney disease (CKD)   . Claustrophobia    takes Ativan as needed  . CML (chronic myeloid leukemia) ([place])    takes Gleevec daily  . Coronary atherosclerosis of unspecified type of vessel, native or graft    a. cath in 2003 showed 10-20% LM, scattered 20% prox-mid LAD, 30% more distal LAD, 50-60% stenosis of LAD towards apex, 20% Cx, 30% dRCA, focal 95% stenosis in smaller of 2 branches of PDA, EF 60-65%.  . Diabetes mellitus ([place])   . Diarrhea    takes Imodium daily as needed  . Diverticulosis   . Diverticulosis of colon (without mention of hemorrhage)   . DJD (degenerative joint disease)   . DVT (deep venous thrombosis) ([place]) 07/2011  . Esophageal stricture   . Essential hypertension    takes Imdur and Lisinopril daily  . Gastric polyps    benign  . GERD (gastroesophageal reflux disease)    takes Nexium daily  . Glaucoma    uses eye drops  . History of bronchitis    not sure when the last time  . History of colon polyps    benign  . History of kidney stones   . History of kidney stones   . Insomnia    takes Melatonin nightly as needed  . Mixed hyperlipidemia    takes Crestor daily  . Myasthenia gravis ([place])    uses prednisone  . Osteoporosis 2016  . Peripheral edema    takes Lasix daily  . Pituitary tumor    checks Dr[facility]Walden at [facility] checks it every Jan   . Pneumonia 01/2016  . PONV (postoperative nausea and vomiting)   . Ptosis   . Pulmonary embolus ([place]) 2012  . Stroke [facility]) 06/2015    Past Surgical  History:  Procedure Laterality Date  . ABDOMINAL AORTOGRAM W/LOWER EXTREMITY N/A 11/18/2016   Procedure: ABDOMINAL AORTOGRAM W/LOWER EXTREMITY;  Surgeon: [name], MD;  Location: [place] [facility];  Service: Cardiovascular;  Laterality: N/A;  . BACK SURGERY     x2, lumbar and cervical  . CARDIAC CATHETERIZATION    . CATARACT EXTRACTION Bilateral 12/12  . COLONOSCOPY    . ESOPHAGOGASTRODUODENOSCOPY ENDOSCOPY  04/2015  . IR GENERIC HISTORICAL  03/03/2016   IR IVC FILTER PLMT / S&I [name] GUID/MOD SED 03/03/2016 [name], MD [facility]  . IR GENERIC HISTORICAL  04/09/2016   IR RADIOLOGIST EVAL & MGMT 04/09/2016 [name], MD [facility]  . IR GENERIC HISTORICAL  04/30/2016   IR IVC FILTER RETRIEVAL / S&I [name] GUID/MOD SED 04/30/2016 [name], MD [facility]  . LITHOTRIPSY    . LUMBAR LAMINECTOMY/DECOMPRESSION MICRODISCECTOMY Right 09/14/2012   Procedure: Right Lumbar three-four, four-five, Lumbar five-Sacral one decompressive laminectomy;  Surgeon: [name], MD;  Location: [place] NEURO ORS;  Service: Neurosurgery;  Laterality: Right;  . LUMBAR LAMINECTOMY/DECOMPRESSION MICRODISCECTOMY Left 03/11/2016   Procedure: Left Lumbar Two-ThreeMicrodiscectomy;  Surgeon: [name], MD;  Location: [place] OR;  Service: Neurosurgery;  Laterality: Left;  . PERIPHERAL VASCULAR INTERVENTION  11/18/2016   Procedure: PERIPHERAL VASCULAR INTERVENTION;  Surgeon: [name], MD;  Location: [place] [facility];  Service: Cardiovascular;;  Left popliteal  . SHOULDER SURGERY Left 05/07/2009  . TONSILLECTOMY      Social History   Socioeconomic History  . Marital status: Married    Spouse name: Hassan Rowan  . Number of children: 2  . Years of education: 1-College  . Highest education level: Not on file  Occupational History  . 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 Financial resource strain: Not on file  . Food insecurity:    Worry: Not on  file    Inability: Not on file  . Transportation needs:    Medical: Not on file    Non-medical: Not on file  Tobacco Use  . Smoking status: Never Smoker  . Smokeless tobacco: Never Used  Substance and Sexual Activity  . Alcohol use: No    Alcohol/week: 0.0 oz  . Drug use: No  . Sexual activity: Not Currently  Lifestyle  . Physical activity:    Days per week: Not on file    Minutes per session: Not on file  . Stress: Not on file  Relationships  . Social connections:    Talks on phone: Not on file    Gets together: Not on file    Attends religious service: Not on file    Active member of club or organization: Not on file    Attends meetings of clubs or organizations: Not on file    Relationship status: Not on file  . Intimate partner violence:    Fear of current or ex partner: Not on file    Emotionally abused: Not on file    Physically abused: Not on file    Forced sexual activity: Not on file  Other Topics Concern  . Not on file  Social History Narrative   Lives at home w/ his wife   Patient is right handed.   Patient has 1-2 cups of caffeine daily.    Current Outpatient Medications on File Prior to Visit  Medication Sig Dispense Refill  . alum & mag hydroxide-simeth (MAALOX/MYLANTA) 200-200-20 MG/5ML suspension Take 15 mLs by mouth every 6 (six) hours as needed for indigestion or heartburn.    . brimonidine-timolol (COMBIGAN) 0.2-0.5 % ophthalmic solution Place 1 drop into both eyes every 12 (twelve) hours.    . cholecalciferol (VITAMIN D) 1000 units tablet Take 1,000 Units by mouth daily.     Marland Kitchen ELIQUIS 5 MG TABS tablet TAKE 1 TABLET (5 MG TOTAL) BY MOUTH 2 (TWO) TIMES DAILY. 60 tablet 3  . furosemide (LASIX) 20 MG tablet TAKE 1 TABLET BY MOUTH DAILY. 90 tablet 2  . imatinib (GLEEVEC) 100 MG tablet Take 300 mg by mouth daily at 12 noon. Take with meals and large glass of water.Caution:Chemotherapy     . isosorbide mononitrate (IMDUR) 30 MG 24 hr tablet Take 1 tablet  (30 mg total) by mouth daily. 90 tablet 3  . lidocaine (LIDODERM) 5 % Place 1 patch onto the skin daily as needed (pain). Remove & Discard patch within 12 hours or as directed by MD    . lisinopril (PRINIVIL,ZESTRIL) 5 MG tablet Take 1 tablet (5 mg total) by mouth daily. 30 tablet 11  . loperamide (IMODIUM) 2 MG capsule Take 2-4 mg by mouth 3 (  three) times daily as needed for diarrhea or loose stools (takes 1-2 tablets every and additional if neded). For diarrhea    . LORazepam (ATIVAN) 0.5 MG tablet TAKE 1 TABLET BY MOUTH AT BEDTIME AS NEEDED FOR SLEEP 30 tablet 3  . Melatonin 3 MG TABS Take 3 mg by mouth at bedtime as needed (for sleep).     . Multiple Vitamin (MULTIVITAMIN) tablet Take 1 tablet by mouth daily.    Marland Kitchen NITROSTAT 0.4 MG SL tablet PLACE 1 TABLET UNDER THE TONGUE EVERY 5 MINUTES X3 DOSES AS NEEDED FOR CHEST PAIN 100 tablet 1  . ondansetron (ZOFRAN ODT) 8 MG disintegrating tablet Take 1 tablet (8 mg total) by mouth every 8 (eight) hours as needed for nausea or vomiting. 90 tablet 1  . predniSONE (DELTASONE) 1 MG tablet Take 4 tablets (4 mg total) by mouth daily with breakfast. 360 tablet 1  . promethazine (PHENERGAN) 12.5 MG tablet Take 1 tablet (12.5 mg total) by mouth every 6 (six) hours as needed for nausea or vomiting. 30 tablet 0  . pyridostigmine (MESTINON) 60 MG tablet Take 60 mg by mouth daily.    . ranitidine (ZANTAC) 150 MG tablet Take 150 mg by mouth 2 (two) times daily as needed for heartburn.    . rosuvastatin (CRESTOR) 10 MG tablet Take 5 mg by mouth daily.    . traMADol (ULTRAM) 50 MG tablet Take 50 mg by mouth every 6 (six) hours as needed (pain).    . [DISCONTINUED] zolpidem (AMBIEN) 10 MG tablet Take 10 mg by mouth at bedtime as needed.      No current facility-administered medications on file prior to visit.     Allergies  Allergen Reactions  . Phenergan [Promethazine Hcl] Swelling    Cannot be combined with Zofran because swelling of the eyes resulted and  patient became very restless-  . Zofran [Ondansetron Hcl] Other (See Comments)    Cannot be combined with Phenergan because swelling of the eyes resulted and patient became very restless-  . Sulfamethoxazole Rash and Itching    Family History  Problem Relation Age of Onset  . Heart disease Mother   . Heart attack Mother   . Stroke Mother   . Heart disease Father   . Heart attack Father   . Stroke Father   . Breast cancer Sister        Twin   . Heart attack Sister   . Dementia Sister   . Heart disease Sister   . Heart attack Sister   . Clotting disorder Sister   . Heart attack Sister   . Colon cancer Neg Hx     BP 130/64 (BP Location: Left Arm, Patient Position: Sitting, Cuff Size: Normal)   Pulse 63   Wt 185 lb 6.4 oz (84.1 kg)   SpO2 98%   BMI 29.04 kg/m    Review of Systems Denies fever and rash    Objective:   Physical Exam VITAL SIGNS:  See vs page GENERAL: no distress Left elbow, extensor aspect: 7 cm diameter swelling/tend/warmth, but no fluctuance.  Left elbow bursa aspiration: consent obtained, signed form on chart The area is first sprayed with cooling agent prep: alcohol pad Aspiration is done with 09O needle no complications, but only 0.5 cc of blood is found  Lab Results  Component Value Date   WBC 7.8 11/19/2016   HGB 10.5 (L) 11/19/2016   HCT 31.6 (L) 11/19/2016   MCV 95.5 11/19/2016   PLT 160  11/19/2016      Assessment & Plan:  Bursitis, new, uncertain etiology.  Infection is unlikely, but we'll rx for this, in view of leukemia.    Patient Instructions  I have sent a prescription to your pharmacy, for an antibiotic Please protect your elbow, with a wrap or brace if necessary Please call or message [place] early next week, to tell us how your elbow is doing.

## 2017-04-30 NOTE — Patient Instructions (Addendum)
I have sent a prescription to your pharmacy, for an antibiotic Please protect your elbow, with a wrap or brace if necessary Please call or message [place] early next week, to tell us how your elbow is doing.

## 2017-05-01 DIAGNOSIS — M7032 Other bursitis of elbow, left elbow: Secondary | ICD-10-CM | POA: Insufficient documentation

## 2017-05-09 ENCOUNTER — Encounter: Payer: Self-pay | Admitting: Endocrinology

## 2017-05-10 ENCOUNTER — Other Ambulatory Visit: Payer: Self-pay | Admitting: Endocrinology

## 2017-05-10 DIAGNOSIS — L821 Other seborrheic keratosis: Secondary | ICD-10-CM | POA: Diagnosis not present

## 2017-05-10 DIAGNOSIS — L570 Actinic keratosis: Secondary | ICD-10-CM | POA: Diagnosis not present

## 2017-05-10 DIAGNOSIS — L578 Other skin changes due to chronic exposure to nonionizing radiation: Secondary | ICD-10-CM | POA: Diagnosis not present

## 2017-05-10 DIAGNOSIS — M7032 Other bursitis of elbow, left elbow: Secondary | ICD-10-CM

## 2017-05-10 DIAGNOSIS — D485 Neoplasm of uncertain behavior of skin: Secondary | ICD-10-CM | POA: Diagnosis not present

## 2017-05-11 ENCOUNTER — Ambulatory Visit (HOSPITAL_COMMUNITY)
Admission: RE | Admit: 2017-05-11 | Discharge: 2017-05-11 | Disposition: A | Discharge status: Home / Self Care | Payer: Medicare Other | Source: Ambulatory Visit | Attending: Cardiology | Admitting: Cardiology

## 2017-05-11 DIAGNOSIS — I70292 Other atherosclerosis of native arteries of extremities, left leg: Secondary | ICD-10-CM | POA: Insufficient documentation

## 2017-05-11 DIAGNOSIS — I739 Peripheral vascular disease, unspecified: Secondary | ICD-10-CM | POA: Diagnosis not present

## 2017-05-11 DIAGNOSIS — I771 Stricture of artery: Secondary | ICD-10-CM | POA: Insufficient documentation

## 2017-05-14 DIAGNOSIS — M7022 Olecranon bursitis, left elbow: Secondary | ICD-10-CM | POA: Diagnosis not present

## 2017-05-18 ENCOUNTER — Encounter: Payer: Self-pay | Admitting: Cardiovascular Disease

## 2017-05-18 ENCOUNTER — Ambulatory Visit (INDEPENDENT_AMBULATORY_CARE_PROVIDER_SITE_OTHER): Payer: Medicare Other | Admitting: Cardiovascular Disease

## 2017-05-18 VITALS — BP 132/68 | HR 72 | Ht 67.0 in | Wt 183.0 lb

## 2017-05-18 DIAGNOSIS — I739 Peripheral vascular disease, unspecified: Secondary | ICD-10-CM

## 2017-05-18 DIAGNOSIS — I6523 Occlusion and stenosis of bilateral carotid arteries: Secondary | ICD-10-CM | POA: Diagnosis not present

## 2017-05-18 DIAGNOSIS — E785 Hyperlipidemia, unspecified: Secondary | ICD-10-CM

## 2017-05-18 DIAGNOSIS — I2510 Atherosclerotic heart disease of native coronary artery without angina pectoris: Secondary | ICD-10-CM

## 2017-05-18 NOTE — Patient Instructions (Signed)
Medication Instructions:  No medication changes   Follow-Up: Your physician wants you to follow-up in: 6 month with Dr. Zannie Cove will receive a reminder letter in the mail two months in advance. If you don't receive a letter, please call our office to schedule the follow-up appointment.   Any Other Special Instructions Will Be Listed Below (If Applicable).     If you need a refill on your cardiac medications before your next appointment, please call your pharmacy.

## 2017-05-18 NOTE — Progress Notes (Signed)
Cardiology Office Note   Date:  05/18/2017   ID:  [name], DOB 13-Apr-1940, MRN [id number]  PCP:  Renato Shin, MD  Cardiologist:  Dr. Burt Knack  No chief complaint on file.     History of Present Illness: [name] is a 77 y.o. male who is here today for a follow-up visit regarding peripheral arterial disease.  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   He was seen in 2018 for severe bilateral calf discomfort with minimal distance walking. Noninvasive vascular evaluation showed severely reduced ABI on the right side and moderately reduced on the left side. Duplex was overall suboptimal. It did show occluded left SFA.  I proceeded with CO2 angiography in October 2018 which showed no significant aortoiliac disease.  There was heavily calcified occluded distal left SFA into the proximal left popliteal artery with three-vessel runoff below the knee.  This was treated with drug-coated balloon angioplasty and self-expanding stent placement due to flow-limiting dissection. On the right side, there was a calcified eccentric stenosis affecting the common femoral artery with borderline significant disease affecting the distal SFA and distal popliteal arteries. The patient had difficult to control bleeding from the right groin after sheath pull.    He continues to report no left calf claudication.  He has moderate right calf claudication after walking about 200 feet.  There has been no worsening in symptoms and he does not feel very limited by this.  Repeat vascular studies showed noncompressible vessels to calculate ABI.  Left SFA stent was patent with moderate disease proximal to that.  Past Medical History:  Diagnosis Date  . Bruises easily    d/t being on Eliquis  . Carotid artery disease ([place])    a. Duplex 05/2014: 40-81% RICA, 4-48% LICA, elevated velocities in right subclavian  artery, normal left subclavian artery..  . Chronic back pain    HNP  . Chronic kidney disease (CKD)   . Claustrophobia    takes Ativan as needed  . 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 Coronary atherosclerosis of unspecified type of vessel, native or graft    a. cath in 2003 showed 10-20% LM, scattered 20% prox-mid LAD, 30% more distal LAD, 50-60% stenosis of LAD towards apex, 20% Cx, 30% dRCA, focal 95% stenosis in smaller of 2 branches of PDA, EF 60-65%.  . Diabetes mellitus ([place])   . Diarrhea    takes Imodium daily as needed  . Diverticulosis   . Diverticulosis of colon (without mention of hemorrhage)   . DJD (degenerative joint disease)   . DVT (deep venous thrombosis) ([place]) 07/2011  . Esophageal stricture   . Essential hypertension    takes Imdur and Lisinopril daily  . Gastric polyps    benign  . GERD (gastroesophageal reflux disease)    takes Nexium daily  . Glaucoma    uses eye drops  . History of bronchitis    not sure when the last time  . History of colon polyps    benign  . History of kidney stones   . History of kidney stones   . Insomnia    takes Melatonin nightly as needed  . Mixed hyperlipidemia    takes Crestor daily  . Myasthenia gravis ([place])    uses prednisone  . Osteoporosis 2016  . Peripheral edema    takes Lasix daily  . Pituitary tumor  checks Dr[facility]Walden at [facility] checks it every Jan   . Pneumonia 01/2016  . PONV (postoperative nausea and vomiting)   . Ptosis   . Pulmonary embolus ([place]) 2012  . Stroke [facility]) 06/2015    Past Surgical History:  Procedure Laterality Date  . ABDOMINAL AORTOGRAM W/LOWER EXTREMITY N/A 11/18/2016   Procedure: ABDOMINAL AORTOGRAM W/LOWER EXTREMITY;  Surgeon: [name], MD;  Location: [place] [facility];  Service: Cardiovascular;  Laterality: N/A;  . BACK SURGERY     x2, lumbar and cervical  . CARDIAC CATHETERIZATION    . CATARACT EXTRACTION Bilateral 12/12  . COLONOSCOPY    .  ESOPHAGOGASTRODUODENOSCOPY ENDOSCOPY  04/2015  . IR GENERIC HISTORICAL  03/03/2016   IR IVC FILTER PLMT / S&I [name] GUID/MOD SED 03/03/2016 [name], MD [facility]  . IR GENERIC HISTORICAL  04/09/2016   IR RADIOLOGIST EVAL & MGMT 04/09/2016 [name], MD [facility]  . IR GENERIC HISTORICAL  04/30/2016   IR IVC FILTER RETRIEVAL / S&I [name] GUID/MOD SED 04/30/2016 [name], MD [facility]  . LITHOTRIPSY    . LUMBAR LAMINECTOMY/DECOMPRESSION MICRODISCECTOMY Right 09/14/2012   Procedure: Right Lumbar three-four, four-five, Lumbar five-Sacral one decompressive laminectomy;  Surgeon: [name], MD;  Location: [place] [facility];  Service: Neurosurgery;  Laterality: Right;  . LUMBAR LAMINECTOMY/DECOMPRESSION MICRODISCECTOMY Left 03/11/2016   Procedure: Left Lumbar Two-ThreeMicrodiscectomy;  Surgeon: [name], MD;  Location: [facility];  Service: Neurosurgery;  Laterality: Left;  . PERIPHERAL VASCULAR INTERVENTION  11/18/2016   Procedure: PERIPHERAL VASCULAR INTERVENTION;  Surgeon: [name], MD;  Location: [place] [facility];  Service: Cardiovascular;;  Left popliteal  . SHOULDER SURGERY Left 05/07/2009  . TONSILLECTOMY       Current Outpatient Medications  Medication Sig Dispense Refill  . alum & mag hydroxide-simeth (MAALOX/MYLANTA) 200-200-20 MG/5ML suspension Take 15 mLs by mouth every 6 (six) hours as needed for indigestion or heartburn.    . brimonidine-timolol (COMBIGAN) 0.2-0.5 % ophthalmic solution Place 1 drop into both eyes every 12 (twelve) hours.    . cholecalciferol (VITAMIN D) 1000 units tablet Take 1,000 Units by mouth daily.     Marland Kitchen ELIQUIS 5 MG TABS tablet TAKE 1 TABLET (5 MG TOTAL) BY MOUTH 2 (TWO) TIMES DAILY. 60 tablet 3  . furosemide (LASIX) 20 MG tablet TAKE 1 TABLET BY MOUTH DAILY. 90 tablet 2  . imatinib (GLEEVEC) 100 MG tablet Take 300 mg by mouth daily at 12 noon. Take with meals and large glass of water.Caution:Chemotherapy     . isosorbide mononitrate  (IMDUR) 30 MG 24 hr tablet Take 1 tablet (30 mg total) by mouth daily. 90 tablet 3  . lidocaine (LIDODERM) 5 % Place 1 patch onto the skin daily as needed (pain). Remove & Discard patch within 12 hours or as directed by MD    . lisinopril (PRINIVIL,ZESTRIL) 5 MG tablet Take 1 tablet (5 mg total) by mouth daily. 30 tablet 11  . loperamide (IMODIUM) 2 MG capsule Take 2-4 mg by mouth 3 (three) times daily as needed for diarrhea or loose stools (takes 1-2 tablets every and additional if neded). For diarrhea    . LORazepam (ATIVAN) 0.5 MG tablet TAKE 1 TABLET BY MOUTH AT BEDTIME AS NEEDED FOR SLEEP 30 tablet 3  . Melatonin 3 MG TABS Take 3 mg by mouth at bedtime as needed (for sleep).     . Multiple Vitamin (MULTIVITAMIN) tablet Take 1 tablet by mouth daily.    Marland Kitchen  NITROSTAT 0.4 MG SL tablet PLACE 1 TABLET UNDER THE TONGUE EVERY 5 MINUTES X3 DOSES AS NEEDED FOR CHEST PAIN 100 tablet 1  . ondansetron (ZOFRAN ODT) 8 MG disintegrating tablet Take 1 tablet (8 mg total) by mouth every 8 (eight) hours as needed for nausea or vomiting. 90 tablet 1  . predniSONE (DELTASONE) 1 MG tablet Take 4 tablets (4 mg total) by mouth daily with breakfast. 360 tablet 1  . promethazine (PHENERGAN) 12.5 MG tablet Take 1 tablet (12.5 mg total) by mouth every 6 (six) hours as needed for nausea or vomiting. 30 tablet 0  . pyridostigmine (MESTINON) 60 MG tablet Take 60 mg by mouth daily.    . ranitidine (ZANTAC) 150 MG tablet Take 150 mg by mouth 2 (two) times daily as needed for heartburn.    . rosuvastatin (CRESTOR) 10 MG tablet Take 5 mg by mouth daily.    . traMADol (ULTRAM) 50 MG tablet Take 50 mg by mouth every 6 (six) hours as needed (pain).     No current facility-administered medications for this visit.     Allergies:   Phenergan [promethazine hcl]; Zofran [ondansetron hcl]; and Sulfamethoxazole    Social History:  The patient  reports that he has never smoked. He has never used smokeless tobacco. He reports that he  does not drink alcohol or use drugs.   Family History:  The patient's family history includes Breast cancer in his sister; Clotting disorder in his sister; Dementia in his sister; Heart attack in his father, mother, sister, sister, and sister; Heart disease in his father, mother, and sister; Stroke in his father and mother.    ROS:  Please see the history of present illness.   Otherwise, review of systems are positive for none.   All other systems are reviewed and negative.    PHYSICAL EXAM: VS:  BP 132/68   Pulse 72   Ht 5\' 7"  (1.702 m)   Wt 183 lb (83 kg)   BMI 28.66 kg/m  , BMI Body mass index is 28.66 kg/m. GEN: Well nourished, well developed, in no acute distress  HEENT: normal  Neck: no JVD, carotid bruits, or masses Cardiac: RRR; no murmurs, rubs, or gallops,no edema  Respiratory:  clear to auscultation bilaterally, normal work of breathing GI: soft, nontender, nondistended, + BS MS: no deformity or atrophy  Skin: warm and dry, no rash Neuro:  Strength and sensation are intact Psych: euthymic mood, full affect Vascular: Femoral pulses +2 bilaterally.    EKG:  EKG is not ordered today.   Recent Labs: 07/02/2016: ALT 24; TSH 1.996 07/03/2016: Magnesium 1.8 11/19/2016: BUN 13; Creatinine, Ser 1.53; Hemoglobin 10.5; Platelets 160; Potassium 4.4; Sodium 137    Lipid Panel    Component Value Date/Time   CHOL 104 07/03/2016 0403   TRIG 91 07/03/2016 0403   HDL 33 (L) 07/03/2016 0403   CHOLHDL 3.2 07/03/2016 0403   VLDL 18 07/03/2016 0403   LDLCALC 53 07/03/2016 0403   LDLDIRECT 59.8 10/12/2012 0737      Wt Readings from Last 3 Encounters:  05/18/17 183 lb (83 kg)  04/30/17 185 lb 6.4 oz (84.1 kg)  04/19/17 181 lb 12.8 oz (82.5 kg)       No flowsheet data found.    ASSESSMENT AND PLAN:  1.  Peripheral arterial disease :   Status post left SFA drug-coated balloon angioplasty and self-expanding stent placement with resolution of left calf claudication.   He  continues to have disease  on the right side but he reports stable right calf claudication and does not feel that this is lifestyle limiting at the present time.  Considering his comorbidities, I think it is reasonable to continue medical therapy and reserve intervention for worsening symptoms.  2. Coronary artery disease involving native coronary arteries without angina: Continue medical therapy.  3. CML: Currently on Gleevec.  4. Hyperlipidemia: Continue treatment with rosuvastatin with a target LDL of less than 70.    Disposition:   FU with me in 6 months  Signed,  [name], MD  05/18/2017 10:18 AM    Uncertain

## 2017-05-21 DIAGNOSIS — M7022 Olecranon bursitis, left elbow: Secondary | ICD-10-CM | POA: Diagnosis not present

## 2017-05-28 DIAGNOSIS — J209 Acute bronchitis, unspecified: Secondary | ICD-10-CM | POA: Diagnosis not present

## 2017-06-06 ENCOUNTER — Encounter (HOSPITAL_COMMUNITY): Payer: Self-pay | Admitting: Emergency Medicine

## 2017-06-06 ENCOUNTER — Emergency Department (HOSPITAL_COMMUNITY)
Admission: EM | Admit: 2017-06-06 | Discharge: 2017-06-06 | Disposition: A | Discharge status: Home / Self Care | Payer: Medicare Other | Attending: Emergency Medicine | Admitting: Emergency Medicine

## 2017-06-06 ENCOUNTER — Emergency Department (HOSPITAL_COMMUNITY): Payer: Medicare Other

## 2017-06-06 DIAGNOSIS — I2510 Atherosclerotic heart disease of native coronary artery without angina pectoris: Secondary | ICD-10-CM | POA: Insufficient documentation

## 2017-06-06 DIAGNOSIS — Z7901 Long term (current) use of anticoagulants: Secondary | ICD-10-CM | POA: Insufficient documentation

## 2017-06-06 DIAGNOSIS — R0602 Shortness of breath: Secondary | ICD-10-CM | POA: Diagnosis present

## 2017-06-06 DIAGNOSIS — I5032 Chronic diastolic (congestive) heart failure: Secondary | ICD-10-CM | POA: Diagnosis not present

## 2017-06-06 DIAGNOSIS — R0789 Other chest pain: Secondary | ICD-10-CM | POA: Diagnosis not present

## 2017-06-06 DIAGNOSIS — R05 Cough: Secondary | ICD-10-CM | POA: Diagnosis not present

## 2017-06-06 DIAGNOSIS — C9211 Chronic myeloid leukemia, BCR/ABL-positive, in remission: Secondary | ICD-10-CM | POA: Insufficient documentation

## 2017-06-06 DIAGNOSIS — R079 Chest pain, unspecified: Secondary | ICD-10-CM | POA: Diagnosis not present

## 2017-06-06 DIAGNOSIS — I130 Hypertensive heart and chronic kidney disease with heart failure and stage 1 through stage 4 chronic kidney disease, or unspecified chronic kidney disease: Secondary | ICD-10-CM | POA: Insufficient documentation

## 2017-06-06 DIAGNOSIS — E1122 Type 2 diabetes mellitus with diabetic chronic kidney disease: Secondary | ICD-10-CM | POA: Diagnosis not present

## 2017-06-06 DIAGNOSIS — N189 Chronic kidney disease, unspecified: Secondary | ICD-10-CM | POA: Insufficient documentation

## 2017-06-06 DIAGNOSIS — J40 Bronchitis, not specified as acute or chronic: Secondary | ICD-10-CM | POA: Diagnosis not present

## 2017-06-06 DIAGNOSIS — Z79899 Other long term (current) drug therapy: Secondary | ICD-10-CM | POA: Insufficient documentation

## 2017-06-06 LAB — CBC WITH DIFFERENTIAL/PLATELET
BASOS ABS: 0 10*3/uL (ref 0.0–0.1)
Basophils Relative: 0 %
Eosinophils Absolute: 0.2 10*3/uL (ref 0.0–0.7)
Eosinophils Relative: 2 %
HEMATOCRIT: 34.7 % — AB (ref 39.0–52.0)
Hemoglobin: 11.8 g/dL — ABNORMAL LOW (ref 13.0–17.0)
LYMPHS PCT: 9 %
Lymphs Abs: 1 10*3/uL (ref 0.7–4.0)
MCH: 33.6 pg (ref 26.0–34.0)
MCHC: 34 g/dL (ref 30.0–36.0)
MCV: 98.9 fL (ref 78.0–100.0)
Monocytes Absolute: 1.4 10*3/uL — ABNORMAL HIGH (ref 0.1–1.0)
Monocytes Relative: 13 %
NEUTROS ABS: 8.3 10*3/uL — AB (ref 1.7–7.7)
Neutrophils Relative %: 76 %
Platelets: 167 10*3/uL (ref 150–400)
RBC: 3.51 MIL/uL — AB (ref 4.22–5.81)
RDW: 14.3 % (ref 11.5–15.5)
WBC: 10.9 10*3/uL — AB (ref 4.0–10.5)

## 2017-06-06 LAB — COMPREHENSIVE METABOLIC PANEL
ALBUMIN: 3.5 g/dL (ref 3.5–5.0)
ALT: 27 U/L (ref 17–63)
ANION GAP: 7 (ref 5–15)
AST: 23 U/L (ref 15–41)
Alkaline Phosphatase: 49 U/L (ref 38–126)
BILIRUBIN TOTAL: 1 mg/dL (ref 0.3–1.2)
BUN: 19 mg/dL (ref 6–20)
CO2: 24 mmol/L (ref 22–32)
Calcium: 8.9 mg/dL (ref 8.9–10.3)
Chloride: 108 mmol/L (ref 101–111)
Creatinine, Ser: 1.64 mg/dL — ABNORMAL HIGH (ref 0.61–1.24)
GFR calc Af Amer: 44 mL/min — ABNORMAL LOW (ref >60)
GFR, EST NON AFRICAN AMERICAN: 38 mL/min — AB (ref >60)
Glucose, Bld: 153 mg/dL — ABNORMAL HIGH (ref 65–99)
POTASSIUM: 3.9 mmol/L (ref 3.5–5.1)
Sodium: 139 mmol/L (ref 135–145)
TOTAL PROTEIN: 5.8 g/dL — AB (ref 6.5–8.1)

## 2017-06-06 MED ORDER — ALBUTEROL SULFATE (2.5 MG/3ML) 0.083% IN NEBU
INHALATION_SOLUTION | RESPIRATORY_TRACT | Status: AC
  Administered 2017-06-06: 5 mg via RESPIRATORY_TRACT
  Filled 2017-06-06: qty 6

## 2017-06-06 MED ORDER — PREDNISONE 20 MG PO TABS: 20.0000 mg | ORAL_TABLET | Freq: Two times a day (BID) | ORAL | 0 refills | Status: DC

## 2017-06-06 MED ORDER — IOPAMIDOL (ISOVUE-370) INJECTION 76%
80.0000 mL | Freq: Once | INTRAVENOUS | Status: AC | PRN
  Administered 2017-06-06: 100 mL via INTRAVENOUS

## 2017-06-06 MED ORDER — FENTANYL CITRATE (PF) 100 MCG/2ML IJ SOLN
50.0000 ug | INTRAMUSCULAR | Status: DC | PRN
  Administered 2017-06-06 (×2): 50 ug via INTRAVENOUS
  Filled 2017-06-06 (×2): qty 2

## 2017-06-06 MED ORDER — SODIUM CHLORIDE 0.9 % IV SOLN
INTRAVENOUS | Status: DC
  Administered 2017-06-06: 13:00:00 via INTRAVENOUS

## 2017-06-06 MED ORDER — ALBUTEROL SULFATE (2.5 MG/3ML) 0.083% IN NEBU
5.0000 mg | INHALATION_SOLUTION | Freq: Once | RESPIRATORY_TRACT | Status: AC
  Administered 2017-06-06: 5 mg via RESPIRATORY_TRACT

## 2017-06-06 MED ORDER — PREDNISONE 20 MG PO TABS
60.0000 mg | ORAL_TABLET | Freq: Once | ORAL | Status: AC
  Administered 2017-06-06: 60 mg via ORAL
  Filled 2017-06-06: qty 3

## 2017-06-06 MED ORDER — AMOXICILLIN-POT CLAVULANATE 875-125 MG PO TABS: 1.0000 | ORAL_TABLET | Freq: Two times a day (BID) | ORAL | 0 refills | Status: DC

## 2017-06-06 MED ORDER — GUAIFENESIN-DM 100-10 MG/5ML PO SYRP
5.0000 mL | ORAL_SOLUTION | ORAL | Status: DC | PRN
  Administered 2017-06-06: 5 mL via ORAL
  Filled 2017-06-06: qty 5

## 2017-06-06 MED ORDER — SODIUM CHLORIDE 0.9 % IV BOLUS
500.0000 mL | Freq: Once | INTRAVENOUS | Status: AC
  Administered 2017-06-06: 500 mL via INTRAVENOUS

## 2017-06-06 MED ORDER — ALBUTEROL (5 MG/ML) CONTINUOUS INHALATION SOLN
10.0000 mg/h | INHALATION_SOLUTION | Freq: Once | RESPIRATORY_TRACT | Status: AC
  Administered 2017-06-06: 10 mg/h via RESPIRATORY_TRACT
  Filled 2017-06-06: qty 20

## 2017-06-06 MED ORDER — IOPAMIDOL (ISOVUE-370) INJECTION 76%
INTRAVENOUS | Status: AC
  Filled 2017-06-06: qty 100

## 2017-06-06 NOTE — ED Notes (Signed)
Pt returned from CT °

## 2017-06-06 NOTE — ED Notes (Signed)
35mcg of fentanyl X2 wasted with cricket, RN in the sharps

## 2017-06-06 NOTE — Discharge Instructions (Addendum)
Make sure that you are getting plenty of rest and drinking a lot of fluids.  To help your cough, use Robitussin-DM.  We are prescribing an antibiotic and prednisone to help with bronchitis.  You should show some improvement over the next few days.  Make sure that you follow-up with your doctor next week for a checkup to ensure you are improving.  If your symptoms worsen or you become more concerned return here, for a checkup.  To help the chest discomfort, try using heat on the sore area and hold the pillow against your chest, when you are coughing.

## 2017-06-06 NOTE — ED Notes (Signed)
Patient transported to CT 

## 2017-06-06 NOTE — ED Provider Notes (Signed)
[facility] EMERGENCY DEPARTMENT Provider Note   CSN: [id number] Arrival date & time: 06/06/17  5852     History   Chief Complaint Chief Complaint  Patient presents with  . Shortness of Breath    HPI [name] is a 77 y.o. male.  The patient presents for evaluation of persistent cough unrelieved by Tessalon, with associated chest pain right anterior chest area.  Pain is severe with coughing and worsening.  He has been ill for several weeks.  Continues to be able to eat and drink and does not have vomiting or diarrhea.  He denies dizziness, focal weakness or paresthesia.  When he coughs he brings up discolored sputum that is sometimes gray in color.  No history of asthma.  Non-smoker.  There are no other known modifying factors.  HPI  Past Medical History:  Diagnosis Date  . Bruises easily    d/t being on Eliquis  . Carotid artery disease ([place])    a. Duplex 05/2014: 77-82% RICA, 4-23% LICA, elevated velocities in right subclavian artery, normal left subclavian artery..  . Chronic back pain    HNP  . Chronic kidney disease (CKD)   . Claustrophobia    takes Ativan as needed  . 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 Coronary atherosclerosis of unspecified type of vessel, native or graft    a. cath in 2003 showed 10-20% LM, scattered 20% prox-mid LAD, 30% more distal LAD, 50-60% stenosis of LAD towards apex, 20% Cx, 30% dRCA, focal 95% stenosis in smaller of 2 branches of PDA, EF 60-65%.  . Diabetes mellitus ([place])   . Diarrhea    takes Imodium daily as needed  . Diverticulosis   . Diverticulosis of colon (without mention of hemorrhage)   . DJD (degenerative joint disease)   . DVT (deep venous thrombosis) ([place]) 07/2011  . Esophageal stricture   . Essential hypertension    takes Imdur and Lisinopril daily  . Gastric polyps    benign  . GERD (gastroesophageal reflux disease)    takes Nexium daily  . Glaucoma    uses eye drops    . History of bronchitis    not sure when the last time  . History of colon polyps    benign  . History of kidney stones   . History of kidney stones   . Insomnia    takes Melatonin nightly as needed  . Mixed hyperlipidemia    takes Crestor daily  . Myasthenia gravis ([place])    uses prednisone  . Osteoporosis 2016  . Peripheral edema    takes Lasix daily  . Pituitary tumor    checks Dr[facility]Walden at [facility] checks it every Jan   . Pneumonia 01/2016  . PONV (postoperative nausea and vomiting)   . Ptosis   . Pulmonary embolus ([place]) 2012  . Stroke [facility]) 06/2015    Patient Active Problem List   Diagnosis Date Noted  . Bursitis of left elbow 05/01/2017  . Dystonia 12/29/2016  . PAD (peripheral artery disease) ([place]) 11/18/2016  . Osteoporosis 08/28/2016  . Paronychia of great toe, left 08/10/2016  . Carotid arterial disease ([place]) 07/09/2016  . Hypocalcemia 07/09/2016  . Diplopia 07/02/2016  . S/P lumbar laminectomy 03/11/2016  . Radicular pain of left lower extremity 02/21/2016  . Cough 01/10/2016  . 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 Myasthenia gravis ([place])   . Diabetes mellitus type 2 in nonobese (  [place])   . Coronary artery disease involving native coronary artery of native heart without angina pectoris   . Acute blood loss anemia   . Ataxia, post-stroke   . Gait disturbance, post-stroke   . Proximal leg weakness   . Cervical spondylosis without myelopathy   . Achilles tendinitis of right lower extremity   . Chronic diastolic CHF (congestive heart failure) ([place])   . Gastroesophageal reflux disease without esophagitis   . Chronic kidney disease   . 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 Cerebellar stroke ([place])   . Ischemic stroke ([place])   . Cerebrovascular accident (CVA) due to thrombosis of left vertebral artery ([place])   . History of pulmonary embolus (PE)   . Chronic anticoagulation   . HLD (hyperlipidemia)   .  Neck pain 03/19/2015  . Renal insufficiency 03/13/2015  . Pain in the chest   . 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 Chest pain syndrome 03/07/2015  . Chronic diastolic heart failure, NYHA class 1 ([place]) 03/07/2015  . Diabetic gastroparesis ([place]) 03/07/2015  . Screening for osteoporosis 07/19/2014  . Edema 04/09/2014  . Routine general medical examination at a health care facility 10/23/2013  . Bilateral carotid bruits 05/24/2013  . Abnormal CT scan, lung 05/24/2013  . Encounter for therapeutic drug monitoring 03/14/2013  . Lymphadenopathy, inguinal 01/11/2013  . AKI (acute kidney injury) ([place]) 10/28/2012  . 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 Myasthenia gravis with exacerbation ([place]) 03/31/2012  . Disease of salivary gland 12/09/2011  . Left facial pain 10/28/2011  . Pulmonary embolism ([place]) 10/25/2011  . 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 GERD with stricture 09/23/2011  . Long term (current) use of anticoagulants 08/03/2011  . History of pulmonary embolism 07/30/2011  . History of DVT (deep vein thrombosis) 07/30/2011  . Subconjunctival hemorrhage 06/22/2011  . Bilateral conjunctivitis 04/09/2011  . Diabetes mellitus type 2, controlled ([place]) 12/18/2010  . Hearing loss 08/04/2010  . Ptosis of eyelid 12/30/2009  . Essential hypertension 04/24/2009  . HYPERLIPIDEMIA, MIXED 10/05/2008  . HEMORRHOIDS-INTERNAL 07/02/2008  . HEMORRHOIDS-EXTERNAL 07/02/2008  . DIVERTICULOSIS-COLON 07/02/2008  . PERSONAL HX COLONIC POLYPS 07/02/2008  . RASH-NONVESICULAR 02/07/2008  . PNEUMONIA 12/08/2007  . Chronic myeloid leukemia in remission ([place]) 07/07/2007  . CAD (coronary artery disease) 07/07/2007  . NEPHROLITHIASIS, HX OF 07/07/2007    Past Surgical History:  Procedure Laterality Date  . ABDOMINAL AORTOGRAM W/LOWER EXTREMITY N/A 11/18/2016   Procedure: ABDOMINAL AORTOGRAM W/LOWER EXTREMITY;  Surgeon: [name], MD;  Location: [facility];  Service: Cardiovascular;  Laterality: N/A;  . BACK SURGERY     x2, lumbar and cervical  . CARDIAC CATHETERIZATION    . CATARACT EXTRACTION Bilateral 12/12  . COLONOSCOPY    . ESOPHAGOGASTRODUODENOSCOPY ENDOSCOPY  04/2015  . IR GENERIC HISTORICAL  03/03/2016   IR IVC FILTER PLMT / S&I [name] GUID/MOD SED 03/03/2016 [name], MD [facility]INTER[facility] RAD  . IR GENERIC HISTORICAL  04/09/2016   IR RADIOLOGIST EVAL & MGMT 04/09/2016 [name], MD [facility] INTER[facility] RAD  . IR GENERIC HISTORICAL  04/30/2016   IR IVC FILTER RETRIEVAL / S&I [name] GUID/MOD SED 04/30/2016 [name], MD [facility]INTER[facility] RAD  . LITHOTRIPSY    . LUMBAR LAMINECTOMY/DECOMPRESSION MICRODISCECTOMY Right 09/14/2012   Procedure: Right Lumbar three-four, four-five, Lumbar five-Sacral one decompressive laminectomy;  Surgeon: [name], MD;  Location: [facility];  Service: Neurosurgery;  Laterality: Right;  . LUMBAR LAMINECTOMY/DECOMPRESSION MICRODISCECTOMY  Left 03/11/2016   Procedure: Left Lumbar Two-ThreeMicrodiscectomy;  Surgeon: [name], MD;  Location: [facility];  Service: Neurosurgery;  Laterality: Left;  . PERIPHERAL VASCULAR INTERVENTION  11/18/2016   Procedure: PERIPHERAL VASCULAR INTERVENTION;  Surgeon: [name], MD;  Location: [facility];  Service: Cardiovascular;;  Left popliteal  . SHOULDER SURGERY Left 05/07/2009  . TONSILLECTOMY          Home Medications    Prior to Admission medications   Medication Sig Start Date End Date Taking? Authorizing Provider  alum & mag hydroxide-simeth (MAALOX/MYLANTA) 200-200-20 MG/5ML suspension Take 15 mLs by mouth every 6 (six) hours as needed for indigestion or heartburn.    [provider]  amoxicillin-clavulanate (AUGMENTIN) 875-125 MG tablet Take 1 tablet by mouth 2 (two) times daily. One po bid x 7 days 06/06/17   Daleen Bo, MD  brimonidine-timolol (COMBIGAN) 0.2-0.5 % ophthalmic solution Place 1 drop into both eyes every 12 (twelve) hours.     [provider]  cholecalciferol (VITAMIN D) 1000 units tablet Take 1,000 Units by mouth daily.     [provider]  ELIQUIS 5 MG TABS tablet TAKE 1 TABLET (5 MG TOTAL) BY MOUTH 2 (TWO) TIMES DAILY. 12/09/15   Kathrynn Ducking, MD  furosemide (LASIX) 20 MG tablet TAKE 1 TABLET BY MOUTH DAILY. 12/23/15   Sherren Mocha, MD  imatinib (GLEEVEC) 100 MG tablet Take 300 mg by mouth daily at 12 noon. Take with meals and large glass of water.Caution:Chemotherapy     [provider]  isosorbide mononitrate (IMDUR) 30 MG 24 hr tablet Take 1 tablet (30 mg total) by mouth daily. 09/17/15   Sherren Mocha, MD  lidocaine (LIDODERM) 5 % Place 1 patch onto the skin daily as needed (pain). Remove & Discard patch within 12 hours or as directed by MD    [provider]  lisinopril (PRINIVIL,ZESTRIL) 5 MG tablet Take 1 tablet (5 mg total) by mouth daily. 11/05/15   Sherren Mocha, MD  loperamide (IMODIUM) 2 MG capsule Take 2-4 mg by mouth 3 (three) times daily as needed for diarrhea or loose stools (takes 1-2 tablets every and additional if neded). For diarrhea    [provider]  LORazepam (ATIVAN) 0.5 MG tablet TAKE 1 TABLET BY MOUTH AT BEDTIME AS NEEDED FOR SLEEP 01/20/17   Renato Shin, MD  Melatonin 3 MG TABS Take 3 mg by mouth at bedtime as needed (for sleep).     [provider]  Multiple Vitamin (MULTIVITAMIN) tablet Take 1 tablet by mouth daily.    [provider]  NITROSTAT 0.4 MG SL tablet PLACE 1 TABLET UNDER THE TONGUE EVERY 5 MINUTES X3 DOSES AS NEEDED FOR CHEST PAIN 11/19/15   Sherren Mocha, MD  ondansetron [facility] ODT) 8 MG disintegrating tablet Take 1 tablet (8 mg total) by mouth every 8 (eight) hours as needed for nausea or vomiting. 05/07/16   Armbruster, Carlota Raspberry, MD  predniSONE (DELTASONE) 20 MG tablet Take 1 tablet (20 mg total) by mouth 2 (two) times daily. 06/06/17   Daleen Bo, MD  promethazine (PHENERGAN) 12.5 MG tablet Take 1  tablet (12.5 mg total) by mouth every 6 (six) hours as needed for nausea or vomiting. 06/28/15   Love, Ivan Anchors, PA-C  pyridostigmine (MESTINON) 60 MG tablet Take 60 mg by mouth daily.    [provider]  ranitidine (ZANTAC) 150 MG tablet Take 150 mg by mouth 2 (two) times daily as needed for heartburn.  [provider]  rosuvastatin (CRESTOR) 10 MG tablet Take 5 mg by mouth daily.    [provider]  traMADol (ULTRAM) 50 MG tablet Take 50 mg by mouth every 6 (six) hours as needed (pain).    [provider]    Family History Family History  Problem Relation Age of Onset  . Heart disease Mother   . Heart attack Mother   . Stroke Mother   . Heart disease Father   . Heart attack Father   . Stroke Father   . Breast cancer Sister        Twin   . Heart attack Sister   . Dementia Sister   . Heart disease Sister   . Heart attack Sister   . Clotting disorder Sister   . Heart attack Sister   . Colon cancer Neg Hx     Social History Social History   Tobacco Use  . Smoking status: Never Smoker  . Smokeless tobacco: Never Used  Substance Use Topics  . Alcohol use: No    Alcohol/week: 0.0 oz  . Drug use: No     Allergies   Phenergan [promethazine hcl]; Zofran [ondansetron hcl]; and Sulfamethoxazole   Review of Systems Review of Systems  All other systems reviewed and are negative.    Physical Exam Updated Vital Signs BP (!) 150/74   Pulse 70   Temp 97.6 F (36.4 C) (Oral)   Resp 16   SpO2 96%   Physical Exam  Constitutional: He is oriented to person, place, and time. He appears well-developed.  Non-toxic appearance. He appears ill. He appears distressed (Uncomfortable, clutching right-sided chest when coughing).  Elderly, frail  HENT:  Head: Normocephalic and atraumatic.  Right Ear: External ear normal.  Left Ear: External ear normal.  Eyes: Pupils are equal, round, and reactive to light. Conjunctivae and EOM are normal.  Neck:  Normal range of motion and phonation normal. Neck supple.  Cardiovascular: Normal rate, regular rhythm and normal heart sounds.  No murmur heard. Pulmonary/Chest: Effort normal. No respiratory distress. He exhibits no bony tenderness.  Persistent cough which is nonproductive during examination.  Cough is painful.  Patient grunting when coughing.  Generalized wheezing with decreased air movement bilaterally.  No rhonchi or rales  Abdominal: Soft. There is no tenderness.  Musculoskeletal: Normal range of motion. He exhibits no edema, tenderness or deformity.  Neurological: He is alert and oriented to person, place, and time. No cranial nerve deficit or sensory deficit. He exhibits normal muscle tone. Coordination normal.  Skin: Skin is warm, dry and intact.  Psychiatric: He has a normal mood and affect. His behavior is normal. Judgment and thought content normal.  Nursing note and vitals reviewed.    ED Treatments / Results  Labs (all labs ordered are listed, but only abnormal results are displayed) Labs Reviewed  COMPREHENSIVE METABOLIC PANEL - Abnormal; Notable for the following components:      Result Value   Glucose, Bld 153 (*)    Creatinine, Ser 1.64 (*)    Total Protein 5.8 (*)    GFR calc non Af Amer 38 (*)    GFR calc Af Amer 44 (*)    All other components within normal limits  CBC WITH DIFFERENTIAL/PLATELET - Abnormal; Notable for the following components:   WBC 10.9 (*)    RBC 3.51 (*)    Hemoglobin 11.8 (*)    HCT 34.7 (*)    Neutro Abs 8.3 (*)    Monocytes  Absolute 1.4 (*)    All other components within normal limits    EKG EKG Interpretation  Date/Time:  Sunday June 06 2017 09:02:44 EDT Ventricular Rate:  83 PR Interval:  168 QRS Duration: 60 QT Interval:  344 QTC Calculation: 404 R Axis:   -32 Text Interpretation:  Normal sinus rhythm Left axis deviation Abnormal ECG since last tracing no significant change Confirmed by Daleen Bo [phone]) on 06/06/2017  12:23:54 PM   Radiology Dg Chest 2 View  Result Date: 06/06/2017 CLINICAL DATA:  Chest pain on the right.  Cough. EXAM: CHEST - 2 VIEW COMPARISON:  March 04, 2016 FINDINGS: No pneumothorax. The heart, hila, and mediastinum are normal. Mild vascular crowding in the medial right lung base. No nodules or masses. No focal infiltrates. No overt edema. No acute abnormality. IMPRESSION: No active cardiopulmonary disease. Electronically Signed   By: Dorise Bullion III M.D   On: 06/06/2017 09:59   Ct Angio Chest Pe W/cm &/or Wo Cm  Result Date: 06/06/2017 CLINICAL DATA:  Cough for 2 months. EXAM: CT ANGIOGRAPHY CHEST WITH CONTRAST TECHNIQUE: Multidetector CT imaging of the chest was performed using the standard protocol during bolus administration of intravenous contrast. Multiplanar CT image reconstructions and MIPs were obtained to evaluate the vascular anatomy. CONTRAST:  149mL ISOVUE-370 IOPAMIDOL (ISOVUE-370) INJECTION 76% COMPARISON:  Chest x-ray June 06, 2017 FINDINGS: Cardiovascular: Coronary artery calcifications. Atherosclerotic changes are seen in the thoracic aorta. No aneurysm or dissection. No pulmonary emboli identified. Mediastinum/Nodes: No enlarged mediastinal, hilar, or axillary lymph nodes. Thyroid gland, trachea, and esophagus demonstrate no significant findings. Lungs/Pleura: Central airways are normal. No pneumothorax. Mild dependent atelectasis on the left. No nodule, mass, or focal infiltrate. Upper Abdomen: No acute abnormality. Musculoskeletal: No chest wall abnormality. No acute or significant osseous findings. Review of the MIP images confirms the above findings. IMPRESSION: 1. No pulmonary emboli. 2. Coronary artery calcifications. Atherosclerotic changes in the thoracic aorta. 3. No other significant abnormalities. Aortic Atherosclerosis (ICD10-I70.0). Electronically Signed   By: Dorise Bullion III M.D   On: 06/06/2017 15:26    Procedures Procedures (including critical care  time)  Medications Ordered in ED Medications  0.9 %  sodium chloride infusion ( Intravenous New Bag/Given 06/06/17 1300)  fentaNYL (SUBLIMAZE) injection 50 mcg (50 mcg Intravenous Given 06/06/17 1546)  guaiFENesin-dextromethorphan (ROBITUSSIN DM) 100-10 MG/5ML syrup 5 mL (5 mLs Oral Given 06/06/17 1248)  iopamidol (ISOVUE-370) 76 % injection (has no administration in time range)  albuterol (PROVENTIL) (2.5 MG/3ML) 0.083% nebulizer solution 5 mg (5 mg Nebulization Given 06/06/17 0911)  sodium chloride 0.9 % bolus 500 mL (0 mLs Intravenous Stopped 06/06/17 1416)  albuterol (PROVENTIL,VENTOLIN) solution continuous neb (10 mg/hr Nebulization Given 06/06/17 1306)  iopamidol (ISOVUE-370) 76 % injection 80 mL (100 mLs Intravenous Contrast Given 06/06/17 1441)  predniSONE (DELTASONE) tablet 60 mg (60 mg Oral Given 06/06/17 1617)     Initial Impression / Assessment and Plan / ED Course  I have reviewed the triage vital signs and the nursing notes.  Pertinent labs & imaging results that were available during my care of the patient were reviewed by me and considered in my medical decision making (see chart for details).  Clinical Course as of Jun 06 1649  Sun Jun 06, 2017  1520 Normal except glucose high 153, creatinine high 1.6, total protein low at 5.8, GFR low 38  Comprehensive metabolic panel(!) [EW]  6045 Normal except white count high 10.9, hemoglobin low 11.8  CBC with Differential(!) [EW]  1521 DG Chest 2 View [EW]  1521 No acute disease  DG Chest 2 View [EW]  1540 No acute chest wall or pulmonary abnormalities.  No evidence for PE, or aortic disorders.  CT Angio Chest PE W/Cm &/Or Wo Cm [EW]    Clinical Course User Index [EW] Daleen Bo, MD     Patient Vitals for the past 24 hrs:  BP Temp Temp src Pulse Resp SpO2  06/06/17 1600 (!) 150/74 - - 70 - 96 %  06/06/17 1400 (!) 148/68 - - 75 16 100 %  06/06/17 1345 (!) 145/50 - - 75 17 100 %  06/06/17 1315 (!) 156/64 - - 66 12 100 %    06/06/17 1309 - - - - - 96 %  06/06/17 1230 (!) 168/76 - - 66 16 98 %  06/06/17 1215 138/70 - - 67 17 98 %  06/06/17 1130 (!) 146/80 - - 66 17 99 %  06/06/17 1029 119/74 97.6 F (36.4 C) Oral 75 20 98 %  06/06/17 0905 (!) 152/81 97.9 F (36.6 C) Oral 85 18 98 %    3:43 PM Reevaluation with update and discussion. After initial assessment and treatment, an updated evaluation reveals patient states he is feeling some better but needs a little bit more pain medicine.  He is currently getting his second dose of fentanyl.  Lungs with somewhat improved air movement at this time without audible wheezes.  Few rhonchi noted at this time.  Patient is coughing up mildly purulent sputum at this time.  Patient states he would like to go home.  Findings discussed with patient and wife, all questions answered. [place] decision making-cough with pleuritic pain, reassuring evaluation.  No evidence for PE, pneumonia, rib fracture or serious bacterial infection.  Wheezing treated with albuterol, with improvement in air movement.  The evaluation is consistent with bronchitis, not specified.  Note that the patient is not a smoker and does not have chronic lung disease.  Nursing Notes Reviewed/ Care Coordinated Applicable Imaging Reviewed Interpretation of Laboratory Data incorporated into ED treatment  The patient appears reasonably screened and/or stabilized for discharge and I doubt any other medical condition or other [facility] requiring further screening, evaluation, or treatment in the ED at this time prior to discharge.  Plan: Home Medications-continue usual, use OTC cough suppressant, and antipyretic if needed; Home Treatments-push oral fluids and try to eat 3 meals a day; return here if the recommended treatment, does not improve the symptoms; Recommended follow up-PCP follow-up in 3 to 4 days, return here if needed   Final Clinical Impressions(s) / ED Diagnoses   Final diagnoses:  Bronchitis     ED Discharge Orders        Ordered    amoxicillin-clavulanate (AUGMENTIN) 875-125 MG tablet  2 times daily     06/06/17 1649    predniSONE (DELTASONE) 20 MG tablet  2 times daily     06/06/17 1649       Daleen Bo, MD 06/06/17 1651

## 2017-06-06 NOTE — ED Triage Notes (Signed)
Pt to ER reports cough x2 months with recent visit to [facility] last week with diagnosis of bronchitis. Placed on tessalon with no relief. Pt tachypnic at present time, O2 sats maintained on RA, wheezing noted in lower lung fields. Breathing tx provided. Pt a/o x4.

## 2017-06-10 ENCOUNTER — Encounter: Payer: Self-pay | Admitting: Endocrinology

## 2017-06-10 ENCOUNTER — Ambulatory Visit (INDEPENDENT_AMBULATORY_CARE_PROVIDER_SITE_OTHER): Payer: Medicare Other | Admitting: Endocrinology

## 2017-06-10 VITALS — BP 118/72 | HR 79 | Temp 99.6°F | Wt 178.8 lb

## 2017-06-10 DIAGNOSIS — R059 Cough, unspecified: Secondary | ICD-10-CM

## 2017-06-10 DIAGNOSIS — R05 Cough: Secondary | ICD-10-CM | POA: Diagnosis not present

## 2017-06-10 DIAGNOSIS — I6523 Occlusion and stenosis of bilateral carotid arteries: Secondary | ICD-10-CM | POA: Diagnosis not present

## 2017-06-10 MED ORDER — FLUTICASONE-SALMETEROL 100-50 MCG/DOSE IN AEPB: 1.0000 | INHALATION_SPRAY | Freq: Two times a day (BID) | RESPIRATORY_TRACT | 1 refills | Status: DC

## 2017-06-10 MED ORDER — PROMETHAZINE-CODEINE 6.25-10 MG/5ML PO SYRP: 2.5000 mL | ORAL_SOLUTION | ORAL | 0 refills | Status: DC | PRN

## 2017-06-10 NOTE — Progress Notes (Signed)
Subjective:    Patient ID: [name], male    DOB: Aug 24, 1940, 77 y.o.   MRN: [id number]  HPI 3-4 months of moderate prod-quality cough in the chest, and assoc pain.  He say he feels slightly better overall, but still has generalized weakness.  He was seen in ER and rx'ed prednisone and augmentin.    Past Medical History:  Diagnosis Date  . Bruises easily    d/t being on Eliquis  . Carotid artery disease ([place])    a. Duplex 05/2014: 10-62% RICA, 6-94% LICA, elevated velocities in right subclavian artery, normal left subclavian artery..  . Chronic back pain    HNP  . Chronic kidney disease (CKD)   . Claustrophobia    takes Ativan as needed  . 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 Coronary atherosclerosis of unspecified type of vessel, native or graft    a. cath in 2003 showed 10-20% LM, scattered 20% prox-mid LAD, 30% more distal LAD, 50-60% stenosis of LAD towards apex, 20% Cx, 30% dRCA, focal 95% stenosis in smaller of 2 branches of PDA, EF 60-65%.  . Diabetes mellitus ([place])   . Diarrhea    takes Imodium daily as needed  . Diverticulosis   . Diverticulosis of colon (without mention of hemorrhage)   . DJD (degenerative joint disease)   . DVT (deep venous thrombosis) ([place]) 07/2011  . Esophageal stricture   . Essential hypertension    takes Imdur and Lisinopril daily  . Gastric polyps    benign  . GERD (gastroesophageal reflux disease)    takes Nexium daily  . Glaucoma    uses eye drops  . History of bronchitis    not sure when the last time  . History of colon polyps    benign  . History of kidney stones   . History of kidney stones   . Insomnia    takes Melatonin nightly as needed  . Mixed hyperlipidemia    takes Crestor daily  . Myasthenia gravis ([place])    uses prednisone  . Osteoporosis 2016  . Peripheral edema    takes Lasix daily  . Pituitary tumor    checks Dr.Walden at [facility] checks it every Jan   . Pneumonia 01/2016  . PONV  (postoperative nausea and vomiting)   . Ptosis   . Pulmonary embolus ([place]) 2012  . Stroke [facility]) 06/2015    Past Surgical History:  Procedure Laterality Date  . ABDOMINAL AORTOGRAM W/LOWER EXTREMITY N/A 11/18/2016   Procedure: ABDOMINAL AORTOGRAM W/LOWER EXTREMITY;  Surgeon: [name], MD;  Location: [facility];  Service: Cardiovascular;  Laterality: N/A;  . BACK SURGERY     x2, lumbar and cervical  . CARDIAC CATHETERIZATION    . CATARACT EXTRACTION Bilateral 12/12  . COLONOSCOPY    . ESOPHAGOGASTRODUODENOSCOPY ENDOSCOPY  04/2015  . IR GENERIC HISTORICAL  03/03/2016   IR IVC FILTER PLMT / S&I [name] GUID/MOD SED 03/03/2016 [name], MD [facility]  . IR GENERIC HISTORICAL  04/09/2016   IR RADIOLOGIST EVAL & MGMT 04/09/2016 [name], MD [facility]  . IR GENERIC HISTORICAL  04/30/2016   IR IVC FILTER RETRIEVAL / S&I [name] GUID/MOD SED 04/30/2016 [name], MD [facility]  . LITHOTRIPSY    . LUMBAR LAMINECTOMY/DECOMPRESSION MICRODISCECTOMY Right 09/14/2012   Procedure: Right Lumbar three-four, four-five, Lumbar five-Sacral one decompressive laminectomy;  Surgeon: [name], MD;  Location: [facility];  Service:  Neurosurgery;  Laterality: Right;  . LUMBAR LAMINECTOMY/DECOMPRESSION MICRODISCECTOMY Left 03/11/2016   Procedure: Left Lumbar Two-ThreeMicrodiscectomy;  Surgeon: [name], MD;  Location: [facility];  Service: Neurosurgery;  Laterality: Left;  . PERIPHERAL VASCULAR INTERVENTION  11/18/2016   Procedure: PERIPHERAL VASCULAR INTERVENTION;  Surgeon: [name], MD;  Location: [facility];  Service: Cardiovascular;;  Left popliteal  . SHOULDER SURGERY Left 05/07/2009  . TONSILLECTOMY      Social History   Socioeconomic History  . Marital status: Married    Spouse name: Hassan Rowan  . Number of children: 2  . Years of education: 1-College  . Highest education level: Not on file  Occupational History  . Occupation: retired    Fish farm manager:  RETIRED    Comment: Retired  Scientific laboratory technician  . Financial resource strain: Not on file  . Food insecurity:    Worry: Not on file    Inability: Not on file  . Transportation needs:    Medical: Not on file    Non-medical: Not on file  Tobacco Use  . Smoking status: Never Smoker  . Smokeless tobacco: Never Used  Substance and Sexual Activity  . Alcohol use: No    Alcohol/week: 0.0 oz  . Drug use: No  . Sexual activity: Not Currently  Lifestyle  . Physical activity:    Days per week: Not on file    Minutes per session: Not on file  . Stress: Not on file  Relationships  . Social connections:    Talks on phone: Not on file    Gets together: Not on file    Attends religious service: Not on file    Active member of club or organization: Not on file    Attends meetings of clubs or organizations: Not on file    Relationship status: Not on file  . Intimate partner violence:    Fear of current or ex partner: Not on file    Emotionally abused: Not on file    Physically abused: Not on file    Forced sexual activity: Not on file  Other Topics Concern  . Not on file  Social History Narrative   Lives at home w/ his wife   Patient is right handed.   Patient has 1-2 cups of caffeine daily.    Current Outpatient Medications on File Prior to Visit  Medication Sig Dispense Refill  . alum & mag hydroxide-simeth (MAALOX/MYLANTA) 200-200-20 MG/5ML suspension Take 15 mLs by mouth every 6 (six) hours as needed for indigestion or heartburn.    Marland Kitchen amoxicillin-clavulanate (AUGMENTIN) 875-125 MG tablet Take 1 tablet by mouth 2 (two) times daily. One po bid x 7 days 14 tablet 0  . brimonidine-timolol (COMBIGAN) 0.2-0.5 % ophthalmic solution Place 1 drop into both eyes every 12 (twelve) hours.    . cholecalciferol (VITAMIN D) 1000 units tablet Take 1,000 Units by mouth daily.     Marland Kitchen ELIQUIS 5 MG TABS tablet TAKE 1 TABLET (5 MG TOTAL) BY MOUTH 2 (TWO) TIMES DAILY. 60 tablet 3  . furosemide (LASIX) 20 MG  tablet TAKE 1 TABLET BY MOUTH DAILY. 90 tablet 2  . imatinib (GLEEVEC) 100 MG tablet Take 300 mg by mouth daily at 12 noon. Take with meals and large glass of water.Caution:Chemotherapy     . isosorbide mononitrate (IMDUR) 30 MG 24 hr tablet Take 1 tablet (30 mg total) by mouth daily. 90 tablet 3  . lidocaine (LIDODERM) 5 % Place 1 patch onto the skin daily  as needed (pain). Remove & Discard patch within 12 hours or as directed by MD    . lisinopril (PRINIVIL,ZESTRIL) 5 MG tablet Take 1 tablet (5 mg total) by mouth daily. 30 tablet 11  . loperamide (IMODIUM) 2 MG capsule Take 2-4 mg by mouth 3 (three) times daily as needed for diarrhea or loose stools (takes 1-2 tablets every and additional if neded). For diarrhea    . LORazepam (ATIVAN) 0.5 MG tablet TAKE 1 TABLET BY MOUTH AT BEDTIME AS NEEDED FOR SLEEP (Patient not taking: Reported on 06/10/2017) 30 tablet 3  . Melatonin 3 MG TABS Take 3 mg by mouth at bedtime as needed (for sleep).     . Multiple Vitamin (MULTIVITAMIN) tablet Take 1 tablet by mouth daily.    Marland Kitchen NITROSTAT 0.4 MG SL tablet PLACE 1 TABLET UNDER THE TONGUE EVERY 5 MINUTES X3 DOSES AS NEEDED FOR CHEST PAIN (Patient not taking: Reported on 06/10/2017) 100 tablet 1  . ondansetron (ZOFRAN ODT) 8 MG disintegrating tablet Take 1 tablet (8 mg total) by mouth every 8 (eight) hours as needed for nausea or vomiting. 90 tablet 1  . predniSONE (DELTASONE) 20 MG tablet Take 1 tablet (20 mg total) by mouth 2 (two) times daily. 10 tablet 0  . pyridostigmine (MESTINON) 60 MG tablet Take 60 mg by mouth daily.    . ranitidine (ZANTAC) 150 MG tablet Take 150 mg by mouth 2 (two) times daily as needed for heartburn.    . rosuvastatin (CRESTOR) 10 MG tablet Take 5 mg by mouth daily.    . traMADol (ULTRAM) 50 MG tablet Take 50 mg by mouth every 6 (six) hours as needed (pain).     No current facility-administered medications on file prior to visit.     Allergies  Allergen Reactions  . Phenergan  [Promethazine Hcl] Swelling    Cannot be combined with Zofran because swelling of the eyes resulted and patient became very restless-  . Zofran [Ondansetron Hcl] Other (See Comments)    Cannot be combined with Phenergan because swelling of the eyes resulted and patient became very restless-  . Sulfamethoxazole Rash and Itching    Family History  Problem Relation Age of Onset  . Heart disease Mother   . Heart attack Mother   . Stroke Mother   . Heart disease Father   . Heart attack Father   . Stroke Father   . Breast cancer Sister        Twin   . Heart attack Sister   . Dementia Sister   . Heart disease Sister   . Heart attack Sister   . Clotting disorder Sister   . Heart attack Sister   . Colon cancer Neg Hx     BP 118/72   Pulse 79   Temp 99.6 F (37.6 C)   Wt 178 lb 12.8 oz (81.1 kg)   SpO2 97%   BMI 28.00 kg/m    Review of Systems He has nasal congestion and wheezing.      Objective:   Physical Exam VITAL SIGNS:  See vs page GENERAL: no distress head: no deformity  eyes: no periorbital swelling, no proptosis  external nose and ears are normal  mouth: no lesion seen LUNGS:  Clear to auscultation, except a few exp wheezes.    Lab Results  Component Value Date   CREATININE 1.64 (H) 06/06/2017   BUN 19 06/06/2017   NA 139 06/06/2017   K 3.9 06/06/2017   CL 108 06/06/2017  CO2 24 06/06/2017   Lab Results  Component Value Date   WBC 10.9 (H) 06/06/2017   HGB 11.8 (L) 06/06/2017   HCT 34.7 (L) 06/06/2017   MCV 98.9 06/06/2017   PLT 167 06/06/2017    CT chest: no infiltrate.      Assessment & Plan:  Cough, persistent Wheezing: persistent Anemia: stble.  we'll follow Renal insuff: stable.  we'll follow  Patient Instructions  I have sent 2 prescriptions to your pharmacy: inhaler and cough syrup. Please finish the antibiotics. I hope you feel better soon.  If you don't feel better by next week, please call back.  Please call sooner if you get  worse.  The next step would be to see a lung specialist.

## 2017-06-10 NOTE — Patient Instructions (Addendum)
I have sent 2 prescriptions to your pharmacy: inhaler and cough syrup. Please finish the antibiotics. I hope you feel better soon.  If you don't feel better by next week, please call back.  Please call sooner if you get worse.  The next step would be to see a lung specialist.

## 2017-06-17 ENCOUNTER — Other Ambulatory Visit: Payer: Self-pay | Admitting: Endocrinology

## 2017-06-17 ENCOUNTER — Encounter: Payer: Self-pay | Admitting: Endocrinology

## 2017-06-17 ENCOUNTER — Ambulatory Visit: Payer: Medicare Other | Admitting: Neurology

## 2017-06-17 DIAGNOSIS — R062 Wheezing: Secondary | ICD-10-CM

## 2017-06-18 ENCOUNTER — Encounter: Payer: Self-pay | Admitting: Endocrinology

## 2017-06-21 ENCOUNTER — Telehealth: Payer: Self-pay | Admitting: Emergency Medicine

## 2017-06-21 NOTE — Telephone Encounter (Signed)
No availability on the schedule between now and next Tuesday.  Called pt to make him aware- advised him the he could call to check for cancellations, and advised that if he was having active chest pain that he needed to seek urgent or emergency care.  Pt expressed understanding.    Will close encounter.

## 2017-06-22 ENCOUNTER — Other Ambulatory Visit: Payer: Self-pay | Admitting: Neurology

## 2017-06-23 NOTE — Telephone Encounter (Signed)
Spoke to pt and he is doing well with MG, and is finishing with 4mg  po daily.  You stated that may taper to 3mg  daily.  He was seen in the ED for bronchitis and given prednisone then 06-06-17, he still has some slight cough.  Did you want to taper?

## 2017-06-27 ENCOUNTER — Emergency Department (HOSPITAL_COMMUNITY): Payer: Medicare Other

## 2017-06-27 ENCOUNTER — Encounter (HOSPITAL_COMMUNITY): Payer: Self-pay | Admitting: Emergency Medicine

## 2017-06-27 ENCOUNTER — Other Ambulatory Visit: Payer: Self-pay

## 2017-06-27 ENCOUNTER — Emergency Department (HOSPITAL_COMMUNITY)
Admission: EM | Admit: 2017-06-27 | Discharge: 2017-06-28 | Disposition: A | Discharge status: Home / Self Care | Payer: Medicare Other | Attending: Emergency Medicine | Admitting: Emergency Medicine

## 2017-06-27 DIAGNOSIS — E119 Type 2 diabetes mellitus without complications: Secondary | ICD-10-CM | POA: Insufficient documentation

## 2017-06-27 DIAGNOSIS — I2510 Atherosclerotic heart disease of native coronary artery without angina pectoris: Secondary | ICD-10-CM | POA: Insufficient documentation

## 2017-06-27 DIAGNOSIS — N189 Chronic kidney disease, unspecified: Secondary | ICD-10-CM | POA: Insufficient documentation

## 2017-06-27 DIAGNOSIS — I5032 Chronic diastolic (congestive) heart failure: Secondary | ICD-10-CM | POA: Insufficient documentation

## 2017-06-27 DIAGNOSIS — R0789 Other chest pain: Secondary | ICD-10-CM | POA: Insufficient documentation

## 2017-06-27 DIAGNOSIS — R079 Chest pain, unspecified: Secondary | ICD-10-CM | POA: Diagnosis not present

## 2017-06-27 DIAGNOSIS — R0602 Shortness of breath: Secondary | ICD-10-CM | POA: Diagnosis not present

## 2017-06-27 DIAGNOSIS — I130 Hypertensive heart and chronic kidney disease with heart failure and stage 1 through stage 4 chronic kidney disease, or unspecified chronic kidney disease: Secondary | ICD-10-CM | POA: Insufficient documentation

## 2017-06-27 DIAGNOSIS — Z79899 Other long term (current) drug therapy: Secondary | ICD-10-CM | POA: Insufficient documentation

## 2017-06-27 DIAGNOSIS — R0781 Pleurodynia: Secondary | ICD-10-CM | POA: Diagnosis not present

## 2017-06-27 LAB — BASIC METABOLIC PANEL
Anion gap: 8 (ref 5–15)
BUN: 14 mg/dL (ref 6–20)
CO2: 28 mmol/L (ref 22–32)
CREATININE: 1.84 mg/dL — AB (ref 0.61–1.24)
Calcium: 9 mg/dL (ref 8.9–10.3)
Chloride: 106 mmol/L (ref 101–111)
GFR calc Af Amer: 38 mL/min — ABNORMAL LOW (ref >60)
GFR, EST NON AFRICAN AMERICAN: 33 mL/min — AB (ref >60)
Glucose, Bld: 152 mg/dL — ABNORMAL HIGH (ref 65–99)
Potassium: 4.3 mmol/L (ref 3.5–5.1)
SODIUM: 142 mmol/L (ref 135–145)

## 2017-06-27 LAB — CBC
HCT: 36.1 % — ABNORMAL LOW (ref 39.0–52.0)
Hemoglobin: 11.9 g/dL — ABNORMAL LOW (ref 13.0–17.0)
MCH: 32 pg (ref 26.0–34.0)
MCHC: 33 g/dL (ref 30.0–36.0)
MCV: 97 fL (ref 78.0–100.0)
PLATELETS: 137 10*3/uL — AB (ref 150–400)
RBC: 3.72 MIL/uL — ABNORMAL LOW (ref 4.22–5.81)
RDW: 13.5 % (ref 11.5–15.5)
WBC: 6.7 10*3/uL (ref 4.0–10.5)

## 2017-06-27 LAB — I-STAT TROPONIN, ED: Troponin i, poc: 0.03 ng/mL (ref 0.00–0.08)

## 2017-06-27 NOTE — ED Triage Notes (Addendum)
Pt states last night he was having abdominal pain and nausea. About 1500 started having right-sided sharp chest pain that is worse with deep breathing.  Took a tramadol about 1900 with no relief. Pt has CHF.  States he was seen here for bronchitis recently and had similar pain with coughing.

## 2017-06-28 ENCOUNTER — Emergency Department (HOSPITAL_COMMUNITY): Payer: Medicare Other

## 2017-06-28 DIAGNOSIS — R0789 Other chest pain: Secondary | ICD-10-CM | POA: Diagnosis not present

## 2017-06-28 DIAGNOSIS — R0781 Pleurodynia: Secondary | ICD-10-CM | POA: Diagnosis not present

## 2017-06-28 LAB — HEPATIC FUNCTION PANEL
ALT: 30 U/L (ref 17–63)
AST: 32 U/L (ref 15–41)
Albumin: 3.6 g/dL (ref 3.5–5.0)
Alkaline Phosphatase: 46 U/L (ref 38–126)
Bilirubin, Direct: 0.2 mg/dL (ref 0.1–0.5)
Indirect Bilirubin: 0.6 mg/dL (ref 0.3–0.9)
TOTAL PROTEIN: 5.6 g/dL — AB (ref 6.5–8.1)
Total Bilirubin: 0.8 mg/dL (ref 0.3–1.2)

## 2017-06-28 MED ORDER — BENZONATATE 100 MG PO CAPS
200.0000 mg | ORAL_CAPSULE | Freq: Once | ORAL | Status: AC
  Administered 2017-06-28: 200 mg via ORAL
  Filled 2017-06-28: qty 2

## 2017-06-28 MED ORDER — TRAMADOL HCL 50 MG PO TABS: 100.0000 mg | ORAL_TABLET | Freq: Three times a day (TID) | ORAL | 0 refills | Status: DC

## 2017-06-28 MED ORDER — FENTANYL CITRATE (PF) 100 MCG/2ML IJ SOLN
50.0000 ug | Freq: Once | INTRAMUSCULAR | Status: AC
  Administered 2017-06-28: 50 ug via INTRAVENOUS
  Filled 2017-06-28: qty 2

## 2017-06-28 NOTE — ED Provider Notes (Addendum)
[facility] EMERGENCY DEPARTMENT Provider Note   CSN: [id number] Arrival date & time: 06/27/17  2133     History   Chief Complaint Chief Complaint  Patient presents with  . Chest Pain    HPI [name] is a 77 y.o. male.  Patient presents to the emergency department with a chief complaint of right rib pain.  He reports having the symptoms for the past 2 weeks.  He reports associated nonproductive cough, but states that when he was seen 2 weeks ago he was having productive cough.  He was prescribed antibiotics at that time and his symptoms seem to improve slightly.  He states that the pain however, has never gone away.  He did have a CT PE study performed at that time, based on patient's history of PE.  He is anticoagulated on Eliquis.  His CT PE study was negative during his last visit 2 weeks ago.  He denies fevers or chills.  Denies nausea or vomiting.  Denies any postprandial pain.  He reports a history of kidney stones, but states that this feels nothing like a kidney stone.  His symptoms are worsened with coughing and with deep inspiration.  He has tried using cough medicine with some relief.  The history is provided by the patient. No language interpreter was used.    Past Medical History:  Diagnosis Date  . Bruises easily    d/t being on Eliquis  . Carotid artery disease ([place])    a. Duplex 05/2014: 57-32% RICA, 2-02% LICA, elevated velocities in right subclavian artery, normal left subclavian artery..  . Chronic back pain    HNP  . Chronic kidney disease (CKD)   . Claustrophobia    takes Ativan as needed  . 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 Coronary atherosclerosis of unspecified type of vessel, native or graft    a. cath in 2003 showed 10-20% LM, scattered 20% prox-mid LAD, 30% more distal LAD, 50-60% stenosis of LAD towards apex, 20% Cx, 30% dRCA, focal 95% stenosis in smaller of 2 branches of PDA, EF 60-65%.  . Diabetes  mellitus ([place])   . Diarrhea    takes Imodium daily as needed  . Diverticulosis   . Diverticulosis of colon (without mention of hemorrhage)   . DJD (degenerative joint disease)   . DVT (deep venous thrombosis) ([place]) 07/2011  . Esophageal stricture   . Essential hypertension    takes Imdur and Lisinopril daily  . Gastric polyps    benign  . GERD (gastroesophageal reflux disease)    takes Nexium daily  . Glaucoma    uses eye drops  . History of bronchitis    not sure when the last time  . History of colon polyps    benign  . History of kidney stones   . History of kidney stones   . Insomnia    takes Melatonin nightly as needed  . Mixed hyperlipidemia    takes Crestor daily  . Myasthenia gravis ([place])    uses prednisone  . Osteoporosis 2016  . Peripheral edema    takes Lasix daily  . Pituitary tumor    checks Dr[facility]Walden at [facility] checks it every Jan   . Pneumonia 01/2016  . PONV (postoperative nausea and vomiting)   . Ptosis   . Pulmonary embolus ([place]) 2012  . Stroke [facility]) 06/2015    Patient Active Problem List   Diagnosis Date Noted  . Wheezing  06/17/2017  . Bursitis of left elbow 05/01/2017  . Dystonia 12/29/2016  . PAD (peripheral artery disease) ([place]) 11/18/2016  . Osteoporosis 08/28/2016  . Paronychia of great toe, left 08/10/2016  . Carotid arterial disease ([place]) 07/09/2016  . Hypocalcemia 07/09/2016  . Diplopia 07/02/2016  . S/P lumbar laminectomy 03/11/2016  . Radicular pain of left lower extremity 02/21/2016  . Cough 01/10/2016  . 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 Myasthenia gravis ([place])   . Diabetes mellitus type 2 in nonobese (HCC)   . Coronary artery disease involving native coronary artery of native heart without angina pectoris   . Acute blood loss anemia   . Ataxia, post-stroke   . Gait disturbance, post-stroke   . Proximal leg weakness   . Cervical spondylosis without myelopathy   . Achilles tendinitis of right  lower extremity   . Chronic diastolic CHF (congestive heart failure) ([place])   . Gastroesophageal reflux disease without esophagitis   . Chronic kidney disease   . 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 Cerebellar stroke ([place])   . Ischemic stroke ([place])   . Cerebrovascular accident (CVA) due to thrombosis of left vertebral artery ([place])   . History of pulmonary embolus (PE)   . Chronic anticoagulation   . HLD (hyperlipidemia)   . Neck pain 03/19/2015  . Renal insufficiency 03/13/2015  . Pain in the chest   . 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 Chest pain syndrome 03/07/2015  . Chronic diastolic heart failure, NYHA class 1 ([place]) 03/07/2015  . Diabetic gastroparesis ([place]) 03/07/2015  . Screening for osteoporosis 07/19/2014  . Edema 04/09/2014  . Routine general medical examination at a health care facility 10/23/2013  . Bilateral carotid bruits 05/24/2013  . Abnormal CT scan, lung 05/24/2013  . Encounter for therapeutic drug monitoring 03/14/2013  . Lymphadenopathy, inguinal 01/11/2013  . AKI (acute kidney injury) ([place]) 10/28/2012  . 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 Myasthenia gravis with exacerbation ([place]) 03/31/2012  . Disease of salivary gland 12/09/2011  . Left facial pain 10/28/2011  . Pulmonary embolism ([place]) 10/25/2011  . 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 GERD with stricture 09/23/2011  . Long term (current) use of anticoagulants 08/03/2011  . History of pulmonary embolism 07/30/2011  . History of DVT (deep vein thrombosis) 07/30/2011  . Subconjunctival hemorrhage 06/22/2011  . Bilateral conjunctivitis 04/09/2011  . Diabetes mellitus type 2, controlled ([place]) 12/18/2010  . Hearing loss 08/04/2010  . Ptosis of eyelid 12/30/2009  . Essential hypertension 04/24/2009  . HYPERLIPIDEMIA, MIXED 10/05/2008  . HEMORRHOIDS-INTERNAL 07/02/2008  . HEMORRHOIDS-EXTERNAL 07/02/2008  .  DIVERTICULOSIS-COLON 07/02/2008  . PERSONAL HX COLONIC POLYPS 07/02/2008  . RASH-NONVESICULAR 02/07/2008  . PNEUMONIA 12/08/2007  . Chronic myeloid leukemia in remission ([place]) 07/07/2007  . CAD (coronary artery disease) 07/07/2007  . NEPHROLITHIASIS, HX OF 07/07/2007    Past Surgical History:  Procedure Laterality Date  . ABDOMINAL AORTOGRAM W/LOWER EXTREMITY N/A 11/18/2016   Procedure: ABDOMINAL AORTOGRAM W/LOWER EXTREMITY;  Surgeon: [name], MD;  Location: [place] [facility];  Service: Cardiovascular;  Laterality: N/A;  . BACK SURGERY     x2, lumbar and cervical  . CARDIAC CATHETERIZATION    . CATARACT EXTRACTION Bilateral 12/12  . COLONOSCOPY    . ESOPHAGOGASTRODUODENOSCOPY ENDOSCOPY  04/2015  . IR GENERIC HISTORICAL  03/03/2016   IR IVC FILTER PLMT / S&I [name] GUID/MOD SED 03/03/2016 [name], MD [facility]INTER[facility] RAD  .  IR GENERIC HISTORICAL  04/09/2016   IR RADIOLOGIST EVAL & MGMT 04/09/2016 [name], MD [facility] INTER[facility] RAD  . IR GENERIC HISTORICAL  04/30/2016   IR IVC FILTER RETRIEVAL / S&I [name] GUID/MOD SED 04/30/2016 [name], MD [facility]-INTERV RAD  . LITHOTRIPSY    . LUMBAR LAMINECTOMY/DECOMPRESSION MICRODISCECTOMY Right 09/14/2012   Procedure: Right Lumbar three-four, four-five, Lumbar five-Sacral one decompressive laminectomy;  Surgeon: [name], MD;  Location: [place] [facility];  Service: Neurosurgery;  Laterality: Right;  . LUMBAR LAMINECTOMY/DECOMPRESSION MICRODISCECTOMY Left 03/11/2016   Procedure: Left Lumbar Two-ThreeMicrodiscectomy;  Surgeon: [name], MD;  Location: [place];  Service: Neurosurgery;  Laterality: Left;  . PERIPHERAL VASCULAR INTERVENTION  11/18/2016   Procedure: PERIPHERAL VASCULAR INTERVENTION;  Surgeon: [name], MD;  Location: [place] [facility];  Service: Cardiovascular;;  Left popliteal  . SHOULDER SURGERY Left 05/07/2009  . TONSILLECTOMY          Home Medications    Prior to Admission medications   Medication Sig Start Date  End Date Taking? Authorizing Provider  alum & mag hydroxide-simeth (MAALOX/MYLANTA) 200-200-20 MG/5ML suspension Take 15 mLs by mouth every 6 (six) hours as needed for indigestion or heartburn.    [provider]  amoxicillin-clavulanate (AUGMENTIN) 875-125 MG tablet Take 1 tablet by mouth 2 (two) times daily. One po bid x 7 days 06/06/17   Daleen Bo, MD  brimonidine-timolol (COMBIGAN) 0.2-0.5 % ophthalmic solution Place 1 drop into both eyes every 12 (twelve) hours.    [provider]  cholecalciferol (VITAMIN D) 1000 units tablet Take 1,000 Units by mouth daily.     [provider]  ELIQUIS 5 MG TABS tablet TAKE 1 TABLET (5 MG TOTAL) BY MOUTH 2 (TWO) TIMES DAILY. 12/09/15   Kathrynn Ducking, MD  Fluticasone-Salmeterol (ADVAIR DISKUS) 100-50 MCG/DOSE AEPB Inhale 1 puff into the lungs 2 (two) times daily. 06/10/17   Renato Shin, MD  furosemide (LASIX) 20 MG tablet TAKE 1 TABLET BY MOUTH DAILY. 12/23/15   Sherren Mocha, MD  imatinib (GLEEVEC) 100 MG tablet Take 300 mg by mouth daily at 12 noon. Take with meals and large glass of water.Caution:Chemotherapy     [provider]  isosorbide mononitrate (IMDUR) 30 MG 24 hr tablet Take 1 tablet (30 mg total) by mouth daily. 09/17/15   Sherren Mocha, MD  lidocaine (LIDODERM) 5 % Place 1 patch onto the skin daily as needed (pain). Remove & Discard patch within 12 hours or as directed by MD    [provider]  lisinopril (PRINIVIL,ZESTRIL) 5 MG tablet Take 1 tablet (5 mg total) by mouth daily. 11/05/15   Sherren Mocha, MD  loperamide (IMODIUM) 2 MG capsule Take 2-4 mg by mouth 3 (three) times daily as needed for diarrhea or loose stools (takes 1-2 tablets every and additional if neded). For diarrhea    [provider]  LORazepam (ATIVAN) 0.5 MG tablet TAKE 1 TABLET BY MOUTH AT BEDTIME AS NEEDED FOR SLEEP Patient not taking: Reported on 06/10/2017 01/20/17   Renato Shin, MD  Melatonin 3 MG TABS Take 3  mg by mouth at bedtime as needed (for sleep).     [provider]  Multiple Vitamin (MULTIVITAMIN) tablet Take 1 tablet by mouth daily.    [provider]  NITROSTAT 0.4 MG SL tablet PLACE 1 TABLET UNDER THE TONGUE EVERY 5 MINUTES X3 DOSES AS NEEDED FOR CHEST PAIN Patient not taking: Reported on 06/10/2017 11/19/15   Sherren Mocha, MD  ondansetron (ZOFRAN ODT) 8 MG disintegrating tablet Take 1 tablet (8 mg total) by mouth every 8 (eight) hours as needed for nausea or vomiting. 05/07/16   Armbruster, Carlota Raspberry, MD  predniSONE (DELTASONE) 1 MG tablet Take 3 tablets (3 mg total) by mouth daily with breakfast. 06/23/17   Kathrynn Ducking, MD  predniSONE (DELTASONE) 20 MG tablet Take 1 tablet (20 mg total) by mouth 2 (two) times daily. 06/06/17   Daleen Bo, MD  promethazine-codeine (PHENERGAN WITH CODEINE) 6.25-10 MG/5ML syrup Take 2.5 mLs by mouth every 4 (four) hours as needed for cough. 06/10/17   Renato Shin, MD  pyridostigmine (MESTINON) 60 MG tablet Take 60 mg by mouth daily.    [provider]  ranitidine (ZANTAC) 150 MG tablet Take 150 mg by mouth 2 (two) times daily as needed for heartburn.    [provider]  rosuvastatin (CRESTOR) 10 MG tablet Take 5 mg by mouth daily.    [provider]  traMADol (ULTRAM) 50 MG tablet Take 50 mg by mouth every 6 (six) hours as needed (pain).    [provider]    Family History Family History  Problem Relation Age of Onset  . Heart disease Mother   . Heart attack Mother   . Stroke Mother   . Heart disease Father   . Heart attack Father   . Stroke Father   . Breast cancer Sister        Twin   . Heart attack Sister   . Dementia Sister   . Heart disease Sister   . Heart attack Sister   . Clotting disorder Sister   . Heart attack Sister   . Colon cancer Neg Hx     Social History Social History   Tobacco Use  . Smoking status: Never Smoker  . Smokeless tobacco: Never Used  Substance Use  Topics  . Alcohol use: No    Alcohol/week: 0.0 oz  . Drug use: No     Allergies   Phenergan [promethazine hcl]; Zofran [ondansetron hcl]; and Sulfamethoxazole   Review of Systems Review of Systems  All other systems reviewed and are negative.    Physical Exam Updated Vital Signs BP (!) 155/127   Pulse 71   Temp 98.3 F (36.8 C) (Oral)   Resp (!) 21   SpO2 99%   Physical Exam  Constitutional: He is oriented to person, place, and time. He appears well-developed and well-nourished.  HENT:  Head: Normocephalic and atraumatic.  Eyes: Pupils are equal, round, and reactive to light. Conjunctivae and EOM are normal. Right eye exhibits no discharge. Left eye exhibits no discharge. No scleral icterus.  Neck: Normal range of motion. Neck supple. No JVD present.  Cardiovascular: Normal rate, regular rhythm and normal heart sounds. Exam reveals no gallop and no friction rub.  No murmur heard. Pulmonary/Chest: Effort normal and breath sounds normal. No respiratory distress. He has no wheezes. He has no rales. He exhibits no tenderness.  Lungs are clear to auscultation No significant rib tenderness  Abdominal: Soft. He exhibits no distension and no mass. There is no tenderness. There is no rebound and no guarding.  No focal abdominal tenderness, no RLQ tenderness or pain at McBurney's point, no RUQ tenderness or Murphy's sign, no left-sided abdominal tenderness, no fluid wave, or signs of peritonitis   Musculoskeletal: Normal range of motion. He exhibits no edema or tenderness.  Neurological: He is alert and oriented to person, place, and time.  Skin: Skin  is warm and dry.  No evidence of zoster  Psychiatric: He has a normal mood and affect. His behavior is normal. Judgment and thought content normal.  Nursing note and vitals reviewed.    ED Treatments / Results  Labs (all labs ordered are listed, but only abnormal results are displayed) Labs Reviewed  BASIC METABOLIC PANEL -  Abnormal; Notable for the following components:      Result Value   Glucose, Bld 152 (*)    Creatinine, Ser 1.84 (*)    GFR calc non Af Amer 33 (*)    GFR calc Af Amer 38 (*)    All other components within normal limits  CBC - Abnormal; Notable for the following components:   RBC 3.72 (*)    Hemoglobin 11.9 (*)    HCT 36.1 (*)    Platelets 137 (*)    All other components within normal limits  HEPATIC FUNCTION PANEL  I-STAT TROPONIN, ED    EKG EKG Interpretation  Date/Time:  Sunday Jun 27 2017 21:39:03 EDT Ventricular Rate:  76 PR Interval:  156 QRS Duration: 76 QT Interval:  370 QTC Calculation: 416 R Axis:   -21 Text Interpretation:  Sinus rhythm with Premature atrial complexes Low voltage QRS Cannot rule out Anterior infarct , age undetermined Abnormal ECG No significant change since last tracing Confirmed by Ripley Fraise [phone]) on 06/28/2017 1:14:04 AM   Radiology Dg Chest 2 View  Result Date: 06/27/2017 CLINICAL DATA:  Acute chest pain and shortness of breath for 2 weeks. EXAM: CHEST - 2 VIEW COMPARISON:  06/06/2017 chest CT and chest radiographs. FINDINGS: The cardiomediastinal silhouette is unremarkable. Mild elevation of the RIGHT hemidiaphragm is unchanged. There is no evidence of focal airspace disease, pulmonary edema, suspicious pulmonary nodule/mass, pleural effusion, or pneumothorax. No acute bony abnormalities are identified. IMPRESSION: No active cardiopulmonary disease. Electronically Signed   By: Margarette Canada M.D.   On: 06/27/2017 22:02    Procedures Procedures (including critical care time)  Medications Ordered in ED Medications  fentaNYL (SUBLIMAZE) injection 50 mcg (has no administration in time range)  benzonatate (TESSALON) capsule 200 mg (has no administration in time range)     Initial Impression / Assessment and Plan / ED Course  I have reviewed the triage vital signs and the nursing notes.  Pertinent labs & imaging results that were  available during my care of the patient were reviewed by me and considered in my medical decision making (see chart for details).    Patient with right rib pain.  He reports having the symptoms for the past several weeks.  His symptoms are worsened with coughing and inspiration.  He states that he was having a productive cough, but was treated with antibiotics.  Recent evaluation in the emergency department on 4/28 revealed negative PE study.  Patient continues to have right rib pain.  His symptoms seem musculoskeletal in nature, however plain films of the right ribs are negative today.  His lungs sound clear.  He does have a history of PE, but takes Eliquis daily, and has not missed any doses.  He is not tachycardic nor hypoxic.  I doubt PE.  Doubt ACS.  Patient seen by and discussed with Dr. Christy Gentles, who agrees with plan for discharge.  Unclear etiology of pain, but emergent etiologies have been adequately ruled out.  Recommend PCP follow-up.  Offered vicodin for pain, patient states that he would rather take tramadol.   Final Clinical Impressions(s) / ED Diagnoses  Final diagnoses:  Rib pain on right side    ED Discharge Orders        Ordered    traMADol (ULTRAM) 50 MG tablet  Every 8 hours     06/28/17 0408           Montine Circle, PA-C 06/28/17 0413    Ripley Fraise, MD 06/28/17 [phone]

## 2017-06-28 NOTE — ED Provider Notes (Signed)
Patient seen/examined in the Emergency Department in conjunction with Midlevel Provider [facility] Patient reports right  chest wall pain for several months.   Exam : Awake alert, no distress.  He has tenderness along right chest.  No erythema.  No rash.  No axillary lymphadenopathy.  No deformities Plan: Low suspicion for ACS, PE, dissection.  He is already had a recent negative CT chest, and takes anticoagulants daily without missed doses I do Not feel repeat CT chest is warranted.  He can follow-up as an outpatient    Ripley Fraise, MD 06/28/17 [phone]

## 2017-06-29 ENCOUNTER — Ambulatory Visit (INDEPENDENT_AMBULATORY_CARE_PROVIDER_SITE_OTHER): Payer: Medicare Other | Admitting: Emergency Medicine

## 2017-06-29 ENCOUNTER — Encounter: Payer: Self-pay | Admitting: Emergency Medicine

## 2017-06-29 DIAGNOSIS — I6523 Occlusion and stenosis of bilateral carotid arteries: Secondary | ICD-10-CM | POA: Diagnosis not present

## 2017-06-29 DIAGNOSIS — R05 Cough: Secondary | ICD-10-CM

## 2017-06-29 DIAGNOSIS — R059 Cough, unspecified: Secondary | ICD-10-CM

## 2017-06-29 NOTE — Progress Notes (Signed)
Subjective:    Patient ID: [name], male    DOB: 1940/07/31, 77 y.o.   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 He is on zantac prn for GERD.   He reports being well until about January when he developed nasal drainage, clear. Developed pressure, HA and was treated with flonase, augmentin for sinusitis. Saw ENT and identified a possible cyst or mass, has not been resected (yet).  Evolved into chest congestion yellow/ green mucous. Was treated three more times for presumed bronchitis. The mucous has cleared and his cough has improved but is still present. He has had focal R costal margin pain. No more nasal congestion. He has intermittent GERD, takes some mylanta,   CT-PA was performed 06/06/2017 and I have reviewed.  This shows no evidence of pulmonary embolism normal lungs, airways without any consolidation or masses.  No pleural disease. Dedicated rib films were performed on 5/20 that I reviewed.  I do not see any evidence of infiltrate and there is no obvious rib fracture.    Review of Systems  Constitutional: Positive for fever. Negative for unexpected weight change.  HENT: Positive for congestion. Negative for dental problem, ear pain, nosebleeds, postnasal drip, rhinorrhea, sinus pressure, sneezing, sore throat and trouble swallowing.   Eyes: Positive for redness. Negative for itching.  Respiratory: Positive for cough, chest tightness, shortness of breath and wheezing.   Cardiovascular: Positive for leg swelling. Negative for palpitations.  Gastrointestinal: Negative for nausea and vomiting.  Genitourinary: Negative for dysuria.  Musculoskeletal: Negative for joint swelling.  Skin: Negative for rash.  Allergic/Immunologic: Positive for environmental allergies. Negative for food  allergies and immunocompromised state.  Neurological: Negative for headaches.  Hematological: Does not bruise/bleed easily.  Psychiatric/Behavioral: Negative for dysphoric mood. The patient is not nervous/anxious.    Past Medical History:  Diagnosis Date  . Bruises easily    d/t being on Eliquis  . Carotid artery disease ([place])    a. Duplex 05/2014: 83-15% RICA, 1-76% LICA, elevated velocities in right subclavian artery, normal left subclavian artery..  . Chronic back pain    HNP  . Chronic kidney disease (CKD)   . Claustrophobia    takes Ativan as needed  . CML (chronic myeloid leukemia) ([place])    takes Gleevec daily  . Coronary atherosclerosis of unspecified type of vessel, native or graft    a. cath in 2003 showed 10-20% LM, scattered 20% prox-mid LAD, 30% more distal LAD, 50-60% stenosis of LAD towards apex, 20% Cx, 30% dRCA, focal 95% stenosis in smaller of 2 branches of PDA, EF 60-65%.  . Diabetes mellitus ([place])   . Diarrhea    takes Imodium daily as needed  . Diverticulosis   . Diverticulosis of colon (without mention of hemorrhage)   . DJD (degenerative joint disease)   . DVT (deep venous thrombosis) ([place]) 07/2011  . Esophageal stricture   . Essential hypertension    takes Imdur and Lisinopril daily  . Gastric polyps    benign  . GERD (gastroesophageal reflux disease)    takes Nexium daily  . Glaucoma    uses eye drops  . History of bronchitis    not sure when the last time  . History of colon polyps    benign  . History of kidney stones   . History of kidney stones   .  Insomnia    takes Melatonin nightly as needed  . Mixed hyperlipidemia    takes Crestor daily  . Myasthenia gravis ([place])    uses prednisone  . Osteoporosis 2016  . Peripheral edema    takes Lasix daily  . Pituitary tumor    [facility] checks it every Jan   . Pneumonia 01/2016  . PONV (postoperative nausea and vomiting)   . Ptosis   . Pulmonary embolus ([place]) 2012  . Stroke  [facility]) 06/2015     Family History  Problem Relation Age of Onset  . Heart disease Mother   . Heart attack Mother   . Stroke Mother   . Heart disease Father   . Heart attack Father   . Stroke Father   . Breast cancer Sister        Twin   . Heart attack Sister   . Dementia Sister   . Heart disease Sister   . Heart attack Sister   . Clotting disorder Sister   . Heart attack Sister   . Colon cancer Neg Hx      Social History   Socioeconomic History  . Marital status: Married    Spouse name: Hassan Rowan  . Number of children: 2  . Years of education: 1-College  . Highest education level: Not on file  Occupational History  . 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 Financial resource strain: Not on file  . Food insecurity:    Worry: Not on file    Inability: Not on file  . Transportation needs:    Medical: Not on file    Non-medical: Not on file  Tobacco Use  . Smoking status: Never Smoker  . Smokeless tobacco: Never Used  Substance and Sexual Activity  . Alcohol use: No    Alcohol/week: 0.0 oz  . Drug use: No  . Sexual activity: Not Currently  Lifestyle  . Physical activity:    Days per week: Not on file    Minutes per session: Not on file  . Stress: Not on file  Relationships  . Social connections:    Talks on phone: Not on file    Gets together: Not on file    Attends religious service: Not on file    Active member of club or organization: Not on file    Attends meetings of clubs or organizations: Not on file    Relationship status: Not on file  . Intimate partner violence:    Fear of current or ex partner: Not on file    Emotionally abused: Not on file    Physically abused: Not on file    Forced sexual activity: Not on file  Other Topics Concern  . Not on file  Social History Narrative   Lives at home w/ his wife   Patient is right handed.   Patient has 1-2 cups of caffeine daily.     Allergies  Allergen  Reactions  . Phenergan [Promethazine Hcl] Swelling    Cannot be combined with Zofran because swelling of the eyes resulted and patient became very restless-  . Zofran [Ondansetron Hcl] Other (See Comments)    Cannot be combined with Phenergan because swelling of the eyes resulted and patient became very restless-  . Sulfamethoxazole Rash and Itching     Outpatient Medications Prior to Visit  Medication Sig Dispense Refill  . brimonidine-timolol (COMBIGAN) 0.2-0.5 % ophthalmic solution Place 1 drop  into both eyes every 12 (twelve) hours.    . cholecalciferol (VITAMIN D) 1000 units tablet Take 1,000 Units by mouth daily.     Marland Kitchen ELIQUIS 5 MG TABS tablet TAKE 1 TABLET (5 MG TOTAL) BY MOUTH 2 (TWO) TIMES DAILY. 60 tablet 3  . furosemide (LASIX) 20 MG tablet TAKE 1 TABLET BY MOUTH DAILY. 90 tablet 2  . imatinib (GLEEVEC) 100 MG tablet Take 300 mg by mouth daily at 12 noon. Take with meals and large glass of water.Caution:Chemotherapy     . isosorbide mononitrate (IMDUR) 30 MG 24 hr tablet Take 1 tablet (30 mg total) by mouth daily. 90 tablet 3  . lidocaine (LIDODERM) 5 % Place 1 patch onto the skin daily. Remove & Discard patch within 12 hours or as directed by MD     . lisinopril (PRINIVIL,ZESTRIL) 5 MG tablet Take 1 tablet (5 mg total) by mouth daily. 30 tablet 11  . loperamide (IMODIUM) 2 MG capsule Take 2-4 mg by mouth 3 (three) times daily as needed for diarrhea or loose stools (takes 1-2 tablets every and additional if neded). For diarrhea    . Melatonin 3 MG TABS Take 3 mg by mouth at bedtime as needed (for sleep).     . Multiple Vitamin (MULTIVITAMIN) tablet Take 1 tablet by mouth daily.    . predniSONE (DELTASONE) 1 MG tablet Take 3 tablets (3 mg total) by mouth daily with breakfast. 270 tablet 1  . pyridostigmine (MESTINON) 60 MG tablet Take 60 mg by mouth daily.    . ranitidine (ZANTAC) 150 MG tablet Take 150 mg by mouth 2 (two) times daily as needed for heartburn.    . rosuvastatin  (CRESTOR) 10 MG tablet Take 5 mg by mouth daily.    . traMADol (ULTRAM) 50 MG tablet Take 2 tablets (100 mg total) by mouth every 8 (eight) hours. 15 tablet 0  . amoxicillin-clavulanate (AUGMENTIN) 875-125 MG tablet Take 1 tablet by mouth 2 (two) times daily. One po bid x 7 days (Patient not taking: Reported on 06/29/2017) 14 tablet 0  . Fluticasone-Salmeterol (ADVAIR DISKUS) 100-50 MCG/DOSE AEPB Inhale 1 puff into the lungs 2 (two) times daily. (Patient not taking: Reported on 06/29/2017) 1 each 1  . LORazepam (ATIVAN) 0.5 MG tablet TAKE 1 TABLET BY MOUTH AT BEDTIME AS NEEDED FOR SLEEP (Patient not taking: Reported on 06/29/2017) 30 tablet 3  . NITROSTAT 0.4 MG SL tablet PLACE 1 TABLET UNDER THE TONGUE EVERY 5 MINUTES X3 DOSES AS NEEDED FOR CHEST PAIN (Patient not taking: Reported on 06/29/2017) 100 tablet 1  . ondansetron (ZOFRAN ODT) 8 MG disintegrating tablet Take 1 tablet (8 mg total) by mouth every 8 (eight) hours as needed for nausea or vomiting. (Patient not taking: Reported on 06/29/2017) 90 tablet 1  . predniSONE (DELTASONE) 20 MG tablet Take 1 tablet (20 mg total) by mouth 2 (two) times daily. (Patient not taking: Reported on 06/29/2017) 10 tablet 0  . promethazine-codeine (PHENERGAN WITH CODEINE) 6.25-10 MG/5ML syrup Take 2.5 mLs by mouth every 4 (four) hours as needed for cough. (Patient not taking: Reported on 06/29/2017) 120 mL 0   No facility-administered medications prior to visit.         Objective:   Physical Exam  Vitals:   06/29/17 1130  BP: 124/78  Pulse: (!) 58  SpO2: 94%  Weight: 179 lb 3.2 oz (81.3 kg)  Height: 5\' 7"  (1.702 m)   Gen: Pleasant, elderly man, in no distress,  normal affect  ENT: No lesions,  mouth clear,  oropharynx clear, no postnasal drip  Neck: No JVD, no stridor  Lungs: No use of accessory muscles, no wheeze or crackles.   Cardiovascular: RRR, heart sounds normal, no murmur or gallops, no peripheral edema  Musculoskeletal: No deformities, no  cyanosis or clubbing  Neuro: alert, non focal  Skin: Warm, no lesions or rashes      Assessment & Plan:  Cough Persistent cough.  It sounds like it was initiated by increased clear rhinitis in the early part of this year.  He may have had a secondary sinusitis and bronchitis, both of these were treated with antibiotics.  Unclear what the role of his possible cyst/mass noted in the sinuses by ENT may be playing.  Work-up is still pending.  No evidence for pulmonary infection based on his imaging. He has multiple sustaining contributors that of causes cough to persist including ACE inhibitor, undertreated GERD.  He is also no longer treating nasal congestion, his symptoms of this have improved.  Course unfortunately complicated by either a costochondritis or a rib fracture due to cough.  I like to treat the identifiable sustaining problems.  If his cough persists then we will look further with pulmonary function testing, possibly further imaging or even bronchoscopy.  Please temporarily start taking your Zantac 150 mg twice a day every day for the next 2 weeks Please temporarily stop your lisinopril for the next 2 weeks. Go ahead and fill your prescription for tramadol.  You can use this medication alternated with ibuprofen and Tylenol for your chest pain. Depending on how your cough improves over the next several weeks we will determine whether you need to have any further pulmonary testing performed. Follow with Dr Lamonte Sakai next available to review your status.   Baltazar Apo, MD, PhD 06/29/2017, 12:51 PM [facility] [phone] or if no answer [phone]

## 2017-06-29 NOTE — Patient Instructions (Signed)
Please temporarily start taking your Zantac 150 mg twice a day every day for the next 2 weeks Please temporarily stop your lisinopril for the next 2 weeks. Go ahead and fill your prescription for tramadol.  You can use this medication alternated with ibuprofen and Tylenol for your chest pain. Depending on how your cough improves over the next several weeks we will determine whether you need to have any further pulmonary testing performed. Follow with Dr Lamonte Sakai next available to review your status.

## 2017-06-29 NOTE — Assessment & Plan Note (Addendum)
Persistent cough.  It sounds like it was initiated by increased clear rhinitis in the early part of this year.  He may have had a secondary sinusitis and bronchitis, both of these were treated with antibiotics.  Unclear what the role of his possible cyst/mass noted in the sinuses by ENT may be playing.  Work-up is still pending.  No evidence for pulmonary infection based on his imaging. He has multiple sustaining contributors that of causes cough to persist including ACE inhibitor, undertreated GERD.  He is also no longer treating nasal congestion, his symptoms of this have improved.  Course unfortunately complicated by either a costochondritis or a rib fracture due to cough.  I like to treat the identifiable sustaining problems.  If his cough persists then we will look further with pulmonary function testing, possibly further imaging or even bronchoscopy.  Please temporarily start taking your Zantac 150 mg twice a day every day for the next 2 weeks Please temporarily stop your lisinopril for the next 2 weeks. Go ahead and fill your prescription for tramadol.  You can use this medication alternated with ibuprofen and Tylenol for your chest pain. Depending on how your cough improves over the next several weeks we will determine whether you need to have any further pulmonary testing performed. Follow with Dr Lamonte Sakai next available to review your status.

## 2017-07-01 DIAGNOSIS — H1132 Conjunctival hemorrhage, left eye: Secondary | ICD-10-CM | POA: Diagnosis not present

## 2017-07-01 DIAGNOSIS — H01005 Unspecified blepharitis left lower eyelid: Secondary | ICD-10-CM | POA: Diagnosis not present

## 2017-07-01 DIAGNOSIS — H01002 Unspecified blepharitis right lower eyelid: Secondary | ICD-10-CM | POA: Diagnosis not present

## 2017-07-06 DIAGNOSIS — L02219 Cutaneous abscess of trunk, unspecified: Secondary | ICD-10-CM | POA: Diagnosis not present

## 2017-07-06 DIAGNOSIS — L570 Actinic keratosis: Secondary | ICD-10-CM | POA: Diagnosis not present

## 2017-07-16 NOTE — Progress Notes (Signed)
This encounter was created in error - please disregard.

## 2017-07-19 ENCOUNTER — Observation Stay (HOSPITAL_COMMUNITY): Payer: Medicare Other

## 2017-07-19 ENCOUNTER — Encounter (HOSPITAL_COMMUNITY): Payer: Self-pay | Admitting: Emergency Medicine

## 2017-07-19 ENCOUNTER — Other Ambulatory Visit: Payer: Self-pay

## 2017-07-19 ENCOUNTER — Emergency Department (HOSPITAL_COMMUNITY): Payer: Medicare Other

## 2017-07-19 ENCOUNTER — Inpatient Hospital Stay (HOSPITAL_COMMUNITY)
Admission: EM | Admit: 2017-07-19 | Discharge: 2017-07-23 | DRG: 069 | Disposition: A | Discharge status: Rehabilitation Facility | Payer: Medicare Other | Attending: Family Medicine | Admitting: Family Medicine

## 2017-07-19 DIAGNOSIS — Z888 Allergy status to other drugs, medicaments and biological substances status: Secondary | ICD-10-CM

## 2017-07-19 DIAGNOSIS — M4802 Spinal stenosis, cervical region: Secondary | ICD-10-CM | POA: Diagnosis present

## 2017-07-19 DIAGNOSIS — R29818 Other symptoms and signs involving the nervous system: Secondary | ICD-10-CM | POA: Diagnosis not present

## 2017-07-19 DIAGNOSIS — R270 Ataxia, unspecified: Secondary | ICD-10-CM

## 2017-07-19 DIAGNOSIS — I639 Cerebral infarction, unspecified: Secondary | ICD-10-CM | POA: Diagnosis not present

## 2017-07-19 DIAGNOSIS — E1151 Type 2 diabetes mellitus with diabetic peripheral angiopathy without gangrene: Secondary | ICD-10-CM | POA: Diagnosis not present

## 2017-07-19 DIAGNOSIS — I2510 Atherosclerotic heart disease of native coronary artery without angina pectoris: Secondary | ICD-10-CM | POA: Diagnosis present

## 2017-07-19 DIAGNOSIS — Z974 Presence of external hearing-aid: Secondary | ICD-10-CM

## 2017-07-19 DIAGNOSIS — R2990 Unspecified symptoms and signs involving the nervous system: Secondary | ICD-10-CM | POA: Diagnosis present

## 2017-07-19 DIAGNOSIS — R079 Chest pain, unspecified: Secondary | ICD-10-CM | POA: Diagnosis not present

## 2017-07-19 DIAGNOSIS — Z86010 Personal history of colonic polyps: Secondary | ICD-10-CM

## 2017-07-19 DIAGNOSIS — H9193 Unspecified hearing loss, bilateral: Secondary | ICD-10-CM | POA: Diagnosis present

## 2017-07-19 DIAGNOSIS — R42 Dizziness and giddiness: Secondary | ICD-10-CM

## 2017-07-19 DIAGNOSIS — I130 Hypertensive heart and chronic kidney disease with heart failure and stage 1 through stage 4 chronic kidney disease, or unspecified chronic kidney disease: Secondary | ICD-10-CM | POA: Diagnosis present

## 2017-07-19 DIAGNOSIS — G7000 Myasthenia gravis without (acute) exacerbation: Secondary | ICD-10-CM | POA: Diagnosis present

## 2017-07-19 DIAGNOSIS — Z8249 Family history of ischemic heart disease and other diseases of the circulatory system: Secondary | ICD-10-CM

## 2017-07-19 DIAGNOSIS — Z803 Family history of malignant neoplasm of breast: Secondary | ICD-10-CM

## 2017-07-19 DIAGNOSIS — K3184 Gastroparesis: Secondary | ICD-10-CM | POA: Diagnosis present

## 2017-07-19 DIAGNOSIS — G459 Transient cerebral ischemic attack, unspecified: Secondary | ICD-10-CM

## 2017-07-19 DIAGNOSIS — E782 Mixed hyperlipidemia: Secondary | ICD-10-CM | POA: Diagnosis present

## 2017-07-19 DIAGNOSIS — Z882 Allergy status to sulfonamides status: Secondary | ICD-10-CM

## 2017-07-19 DIAGNOSIS — E1143 Type 2 diabetes mellitus with diabetic autonomic (poly)neuropathy: Secondary | ICD-10-CM | POA: Diagnosis present

## 2017-07-19 DIAGNOSIS — H9190 Unspecified hearing loss, unspecified ear: Secondary | ICD-10-CM

## 2017-07-19 DIAGNOSIS — G458 Other transient cerebral ischemic attacks and related syndromes: Principal | ICD-10-CM | POA: Diagnosis present

## 2017-07-19 DIAGNOSIS — Z7901 Long term (current) use of anticoagulants: Secondary | ICD-10-CM

## 2017-07-19 DIAGNOSIS — R260 Ataxic gait: Secondary | ICD-10-CM | POA: Diagnosis present

## 2017-07-19 DIAGNOSIS — I6502 Occlusion and stenosis of left vertebral artery: Secondary | ICD-10-CM | POA: Diagnosis not present

## 2017-07-19 DIAGNOSIS — N289 Disorder of kidney and ureter, unspecified: Secondary | ICD-10-CM

## 2017-07-19 DIAGNOSIS — Z823 Family history of stroke: Secondary | ICD-10-CM

## 2017-07-19 DIAGNOSIS — N183 Chronic kidney disease, stage 3 (moderate): Secondary | ICD-10-CM | POA: Diagnosis present

## 2017-07-19 DIAGNOSIS — K219 Gastro-esophageal reflux disease without esophagitis: Secondary | ICD-10-CM | POA: Diagnosis present

## 2017-07-19 DIAGNOSIS — Z86711 Personal history of pulmonary embolism: Secondary | ICD-10-CM

## 2017-07-19 DIAGNOSIS — C9211 Chronic myeloid leukemia, BCR/ABL-positive, in remission: Secondary | ICD-10-CM | POA: Diagnosis not present

## 2017-07-19 DIAGNOSIS — E1122 Type 2 diabetes mellitus with diabetic chronic kidney disease: Secondary | ICD-10-CM | POA: Diagnosis not present

## 2017-07-19 DIAGNOSIS — I5032 Chronic diastolic (congestive) heart failure: Secondary | ICD-10-CM | POA: Diagnosis present

## 2017-07-19 DIAGNOSIS — H409 Unspecified glaucoma: Secondary | ICD-10-CM | POA: Diagnosis present

## 2017-07-19 DIAGNOSIS — Z981 Arthrodesis status: Secondary | ICD-10-CM

## 2017-07-19 DIAGNOSIS — Z7952 Long term (current) use of systemic steroids: Secondary | ICD-10-CM

## 2017-07-19 DIAGNOSIS — Z7951 Long term (current) use of inhaled steroids: Secondary | ICD-10-CM

## 2017-07-19 DIAGNOSIS — Z9089 Acquired absence of other organs: Secondary | ICD-10-CM

## 2017-07-19 DIAGNOSIS — R29700 NIHSS score 0: Secondary | ICD-10-CM | POA: Diagnosis present

## 2017-07-19 DIAGNOSIS — Z86718 Personal history of other venous thrombosis and embolism: Secondary | ICD-10-CM

## 2017-07-19 DIAGNOSIS — I493 Ventricular premature depolarization: Secondary | ICD-10-CM | POA: Diagnosis present

## 2017-07-19 DIAGNOSIS — Z79899 Other long term (current) drug therapy: Secondary | ICD-10-CM

## 2017-07-19 DIAGNOSIS — M810 Age-related osteoporosis without current pathological fracture: Secondary | ICD-10-CM | POA: Diagnosis present

## 2017-07-19 DIAGNOSIS — Z818 Family history of other mental and behavioral disorders: Secondary | ICD-10-CM

## 2017-07-19 DIAGNOSIS — D696 Thrombocytopenia, unspecified: Secondary | ICD-10-CM | POA: Diagnosis present

## 2017-07-19 DIAGNOSIS — I69328 Other speech and language deficits following cerebral infarction: Secondary | ICD-10-CM

## 2017-07-19 DIAGNOSIS — G4700 Insomnia, unspecified: Secondary | ICD-10-CM | POA: Diagnosis present

## 2017-07-19 DIAGNOSIS — Z87442 Personal history of urinary calculi: Secondary | ICD-10-CM

## 2017-07-19 DIAGNOSIS — I10 Essential (primary) hypertension: Secondary | ICD-10-CM | POA: Diagnosis present

## 2017-07-19 DIAGNOSIS — E119 Type 2 diabetes mellitus without complications: Secondary | ICD-10-CM

## 2017-07-19 DIAGNOSIS — Z832 Family history of diseases of the blood and blood-forming organs and certain disorders involving the immune mechanism: Secondary | ICD-10-CM

## 2017-07-19 LAB — COMPREHENSIVE METABOLIC PANEL
ALBUMIN: 3.7 g/dL (ref 3.5–5.0)
ALT: 25 U/L (ref 17–63)
ANION GAP: 10 (ref 5–15)
AST: 31 U/L (ref 15–41)
Alkaline Phosphatase: 46 U/L (ref 38–126)
BILIRUBIN TOTAL: 1.1 mg/dL (ref 0.3–1.2)
BUN: 12 mg/dL (ref 6–20)
CO2: 24 mmol/L (ref 22–32)
Calcium: 9.3 mg/dL (ref 8.9–10.3)
Chloride: 106 mmol/L (ref 101–111)
Creatinine, Ser: 1.63 mg/dL — ABNORMAL HIGH (ref 0.61–1.24)
GFR calc Af Amer: 45 mL/min — ABNORMAL LOW (ref >60)
GFR, EST NON AFRICAN AMERICAN: 38 mL/min — AB (ref >60)
Glucose, Bld: 131 mg/dL — ABNORMAL HIGH (ref 65–99)
POTASSIUM: 4 mmol/L (ref 3.5–5.1)
Sodium: 140 mmol/L (ref 135–145)
TOTAL PROTEIN: 5.9 g/dL — AB (ref 6.5–8.1)

## 2017-07-19 LAB — DIFFERENTIAL
ABS IMMATURE GRANULOCYTES: 0 10*3/uL (ref 0.0–0.1)
Basophils Absolute: 0 10*3/uL (ref 0.0–0.1)
Basophils Relative: 0 %
Eosinophils Absolute: 0 10*3/uL (ref 0.0–0.7)
Eosinophils Relative: 0 %
Immature Granulocytes: 0 %
LYMPHS PCT: 17 %
Lymphs Abs: 1.5 10*3/uL (ref 0.7–4.0)
MONOS PCT: 11 %
Monocytes Absolute: 1 10*3/uL (ref 0.1–1.0)
NEUTROS ABS: 6.4 10*3/uL (ref 1.7–7.7)
Neutrophils Relative %: 72 %

## 2017-07-19 LAB — LIPASE, BLOOD: Lipase: 38 U/L (ref 11–51)

## 2017-07-19 LAB — GLUCOSE, CAPILLARY: GLUCOSE-CAPILLARY: 140 mg/dL — AB (ref 65–99)

## 2017-07-19 LAB — PROTIME-INR
INR: 1.25
Prothrombin Time: 15.6 seconds — ABNORMAL HIGH (ref 11.4–15.2)

## 2017-07-19 LAB — URINALYSIS, ROUTINE W REFLEX MICROSCOPIC
Bilirubin Urine: NEGATIVE
Glucose, UA: NEGATIVE mg/dL
Hgb urine dipstick: NEGATIVE
Ketones, ur: NEGATIVE mg/dL
Leukocytes, UA: NEGATIVE
NITRITE: NEGATIVE
PH: 6 (ref 5.0–8.0)
Protein, ur: NEGATIVE mg/dL
SPECIFIC GRAVITY, URINE: 1.043 — AB (ref 1.005–1.030)

## 2017-07-19 LAB — CBC
HEMATOCRIT: 37.3 % — AB (ref 39.0–52.0)
Hemoglobin: 12.5 g/dL — ABNORMAL LOW (ref 13.0–17.0)
MCH: 32.9 pg (ref 26.0–34.0)
MCHC: 33.5 g/dL (ref 30.0–36.0)
MCV: 98.2 fL (ref 78.0–100.0)
Platelets: 188 10*3/uL (ref 150–400)
RBC: 3.8 MIL/uL — ABNORMAL LOW (ref 4.22–5.81)
RDW: 14.4 % (ref 11.5–15.5)
WBC: 9.1 10*3/uL (ref 4.0–10.5)

## 2017-07-19 LAB — I-STAT CG4 LACTIC ACID, ED
Lactic Acid, Venous: 2.22 mmol/L (ref 0.5–1.9)
Lactic Acid, Venous: 1.08 mmol/L (ref 0.5–1.9)

## 2017-07-19 LAB — I-STAT TROPONIN, ED: TROPONIN I, POC: 0.02 ng/mL (ref 0.00–0.08)

## 2017-07-19 LAB — CBG MONITORING, ED
GLUCOSE-CAPILLARY: 119 mg/dL — AB (ref 65–99)
Glucose-Capillary: 137 mg/dL — ABNORMAL HIGH (ref 65–99)

## 2017-07-19 LAB — I-STAT CHEM 8, ED
BUN: 17 mg/dL (ref 6–20)
CALCIUM ION: 1.2 mmol/L (ref 1.15–1.40)
Chloride: 105 mmol/L (ref 101–111)
Creatinine, Ser: 1.6 mg/dL — ABNORMAL HIGH (ref 0.61–1.24)
Glucose, Bld: 129 mg/dL — ABNORMAL HIGH (ref 65–99)
HCT: 36 % — ABNORMAL LOW (ref 39.0–52.0)
Hemoglobin: 12.2 g/dL — ABNORMAL LOW (ref 13.0–17.0)
Potassium: 4.2 mmol/L (ref 3.5–5.1)
SODIUM: 139 mmol/L (ref 135–145)
TCO2: 27 mmol/L (ref 22–32)

## 2017-07-19 LAB — RAPID URINE DRUG SCREEN, HOSP PERFORMED
AMPHETAMINES: NOT DETECTED
BENZODIAZEPINES: NOT DETECTED
Barbiturates: NOT DETECTED
COCAINE: NOT DETECTED
OPIATES: NOT DETECTED
Tetrahydrocannabinol: NOT DETECTED

## 2017-07-19 LAB — APTT: APTT: 29 s (ref 24–36)

## 2017-07-19 MED ORDER — MECLIZINE HCL 25 MG PO TABS
25.0000 mg | ORAL_TABLET | Freq: Three times a day (TID) | ORAL | Status: DC | PRN
  Administered 2017-07-20 – 2017-07-21 (×2): 25 mg via ORAL
  Filled 2017-07-19: qty 1
  Filled 2017-07-19: qty 2

## 2017-07-19 MED ORDER — TIMOLOL MALEATE 0.5 % OP SOLN
1.0000 [drp] | Freq: Two times a day (BID) | OPHTHALMIC | Status: DC
  Administered 2017-07-20 – 2017-07-23 (×8): 1 [drp] via OPHTHALMIC
  Filled 2017-07-19: qty 5

## 2017-07-19 MED ORDER — STROKE: EARLY STAGES OF RECOVERY BOOK
Freq: Once | Status: DC
Start: 2017-07-19 — End: 2017-07-23
  Filled 2017-07-19: qty 1

## 2017-07-19 MED ORDER — ISOSORBIDE MONONITRATE ER 30 MG PO TB24
30.0000 mg | ORAL_TABLET | Freq: Every day | ORAL | Status: DC
  Administered 2017-07-20 – 2017-07-23 (×4): 30 mg via ORAL
  Filled 2017-07-19 (×4): qty 1

## 2017-07-19 MED ORDER — TOBRAMYCIN 0.3 % OP SOLN
1.0000 [drp] | Freq: Four times a day (QID) | OPHTHALMIC | Status: DC
  Administered 2017-07-20 – 2017-07-23 (×15): 1 [drp] via OPHTHALMIC
  Filled 2017-07-19: qty 5

## 2017-07-19 MED ORDER — IMATINIB MESYLATE 100 MG PO TABS
300.0000 mg | ORAL_TABLET | Freq: Every day | ORAL | Status: DC
  Administered 2017-07-20 – 2017-07-23 (×4): 300 mg via ORAL
  Filled 2017-07-19 (×6): qty 3

## 2017-07-19 MED ORDER — ALPRAZOLAM 0.25 MG PO TABS
0.2500 mg | ORAL_TABLET | Freq: Once | ORAL | Status: AC
  Administered 2017-07-19: 0.25 mg via ORAL
  Filled 2017-07-19: qty 1

## 2017-07-19 MED ORDER — LIDOCAINE 5 % EX PTCH
1.0000 | MEDICATED_PATCH | Freq: Every day | CUTANEOUS | Status: DC | PRN
  Administered 2017-07-21 – 2017-07-22 (×2): 1 via TRANSDERMAL
  Filled 2017-07-19 (×2): qty 1

## 2017-07-19 MED ORDER — INSULIN ASPART 100 UNIT/ML ~~LOC~~ SOLN
0.0000 [IU] | Freq: Three times a day (TID) | SUBCUTANEOUS | Status: DC
  Administered 2017-07-21 – 2017-07-22 (×2): 1 [IU] via SUBCUTANEOUS

## 2017-07-19 MED ORDER — APIXABAN 5 MG PO TABS
5.0000 mg | ORAL_TABLET | Freq: Two times a day (BID) | ORAL | Status: DC
  Administered 2017-07-20 – 2017-07-23 (×7): 5 mg via ORAL
  Filled 2017-07-19 (×7): qty 1

## 2017-07-19 MED ORDER — IOPAMIDOL (ISOVUE-370) INJECTION 76%
INTRAVENOUS | Status: AC
  Filled 2017-07-19: qty 100

## 2017-07-19 MED ORDER — ACETAMINOPHEN 325 MG PO TABS: 650.0000 mg | ORAL_TABLET | Freq: Four times a day (QID) | ORAL | Status: DC | PRN

## 2017-07-19 MED ORDER — BISACODYL 10 MG RE SUPP: 10.0000 mg | Freq: Every day | RECTAL | Status: DC | PRN

## 2017-07-19 MED ORDER — BRIMONIDINE TARTRATE-TIMOLOL 0.2-0.5 % OP SOLN
1.0000 [drp] | Freq: Two times a day (BID) | OPHTHALMIC | Status: DC
  Filled 2017-07-19: qty 5

## 2017-07-19 MED ORDER — METOCLOPRAMIDE HCL 5 MG/ML IJ SOLN
5.0000 mg | Freq: Once | INTRAMUSCULAR | Status: AC
  Administered 2017-07-19: 5 mg via INTRAVENOUS
  Filled 2017-07-19: qty 2

## 2017-07-19 MED ORDER — PYRIDOSTIGMINE BROMIDE 60 MG PO TABS
60.0000 mg | ORAL_TABLET | Freq: Every day | ORAL | Status: DC
  Administered 2017-07-20 – 2017-07-23 (×4): 60 mg via ORAL
  Filled 2017-07-19 (×4): qty 1

## 2017-07-19 MED ORDER — SODIUM CHLORIDE 0.9 % IV SOLN
INTRAVENOUS | Status: DC
  Administered 2017-07-19 – 2017-07-21 (×4): via INTRAVENOUS

## 2017-07-19 MED ORDER — HYDRALAZINE HCL 20 MG/ML IJ SOLN: 5.0000 mg | Freq: Three times a day (TID) | INTRAMUSCULAR | Status: DC | PRN

## 2017-07-19 MED ORDER — SENNOSIDES-DOCUSATE SODIUM 8.6-50 MG PO TABS: 1.0000 | ORAL_TABLET | Freq: Every evening | ORAL | Status: DC | PRN

## 2017-07-19 MED ORDER — BRIMONIDINE TARTRATE 0.2 % OP SOLN
1.0000 [drp] | Freq: Two times a day (BID) | OPHTHALMIC | Status: DC
  Administered 2017-07-20 – 2017-07-23 (×8): 1 [drp] via OPHTHALMIC
  Filled 2017-07-19: qty 5

## 2017-07-19 MED ORDER — ONDANSETRON HCL 4 MG/2ML IJ SOLN
4.0000 mg | Freq: Once | INTRAMUSCULAR | Status: AC
  Administered 2017-07-19: 4 mg via INTRAVENOUS
  Filled 2017-07-19: qty 2

## 2017-07-19 MED ORDER — LORAZEPAM 2 MG/ML IJ SOLN: 1.0000 mg | Freq: Once | INTRAMUSCULAR | Status: DC

## 2017-07-19 MED ORDER — ATORVASTATIN CALCIUM 80 MG PO TABS: 80.0000 mg | ORAL_TABLET | Freq: Every day | ORAL | Status: DC

## 2017-07-19 MED ORDER — FAMOTIDINE 20 MG PO TABS
20.0000 mg | ORAL_TABLET | Freq: Two times a day (BID) | ORAL | Status: DC
  Administered 2017-07-20 – 2017-07-23 (×7): 20 mg via ORAL
  Filled 2017-07-19 (×7): qty 1

## 2017-07-19 MED ORDER — TRAMADOL HCL 50 MG PO TABS: 50.0000 mg | ORAL_TABLET | Freq: Three times a day (TID) | ORAL | Status: DC | PRN

## 2017-07-19 MED ORDER — IOPAMIDOL (ISOVUE-370) INJECTION 76%
50.0000 mL | Freq: Once | INTRAVENOUS | Status: AC | PRN
  Administered 2017-07-19: 100 mL via INTRAVENOUS

## 2017-07-19 MED ORDER — ONDANSETRON HCL 4 MG PO TABS: 4.0000 mg | ORAL_TABLET | Freq: Four times a day (QID) | ORAL | Status: DC | PRN

## 2017-07-19 MED ORDER — ONDANSETRON HCL 4 MG/2ML IJ SOLN: 4.0000 mg | Freq: Four times a day (QID) | INTRAMUSCULAR | Status: DC | PRN

## 2017-07-19 MED ORDER — LORAZEPAM 0.5 MG PO TABS: 0.5000 mg | ORAL_TABLET | Freq: Every evening | ORAL | Status: DC | PRN

## 2017-07-19 MED ORDER — ACETAMINOPHEN 650 MG RE SUPP: 650.0000 mg | Freq: Four times a day (QID) | RECTAL | Status: DC | PRN

## 2017-07-19 NOTE — ED Notes (Signed)
Elevated CG-4 reported to Dr. Kathrynn Humble

## 2017-07-19 NOTE — Progress Notes (Signed)
Pt went down for MRI

## 2017-07-19 NOTE — ED Provider Notes (Signed)
Patient placed in Quick Look pathway, seen and evaluated   Chief Complaint: n/v weakness  HPI:   77 y.o. male with hx of DVT, PE, Stroke and multiple other medical problems.presents to the ED via EMS with n/v and weakness that started today.  ROS: GI: n/v  Physical Exam:    Gen: No distress  Neuro: Awake and Alert  Skin: pale  GI: actively vomiting   Patient taken directly to room.   Initiation of care has begun. The patient has been counseled on the process, plan, and necessity for staying for the completion/evaluation, and the remainder of the medical screening examination    Ashley Murrain, NP 07/19/17 [place], MD 07/20/17 1041

## 2017-07-19 NOTE — ED Triage Notes (Signed)
Pt c/o of N/V and dizziness x 1 hour. LKW was 1330. Pt was eating lunch became dizzy and started to vomit. A&Ox4. Hx of stroke two years ago.

## 2017-07-19 NOTE — Code Documentation (Signed)
77 yo male coming from home with complaints of sudden onset of dizziness, nausea, and vomiting that started after eating lunch. Pt has hx of stroke with same symptoms. Pt was brought to ED by POV. Evaluated and called Code Stroke by EDP. Initial NIHSS 0 with noted nystagmus and subjective dizziness. No visual changes, field cuts, or gaze. Contraindicated for tPA due to being on Eliquis. CTA completed and pt is not LVO positive or candidate for endovascular. Plan to get MRI. Handoff given to Rod Holler, Therapist, sports.

## 2017-07-19 NOTE — Progress Notes (Signed)
Pt has returned from MRI. 

## 2017-07-19 NOTE — H&P (Signed)
History and Physical    [name] GYB:[id number] DOB: 12-Mar-1940 DOA: 07/19/2017   PCP: Tristan Shin, MD   Patient coming from:  Home    Chief Complaint: nausea and dizziness  HPI: [name] is a 77 y.o. male with extensive medical history listed below, including CAD, history of multiple cerebellar strokes, the first 1 in 2017, on Eliquis, the last one about 1 year ago, hypertension, hyperlipidemia, diabetes, who was in his usual state of health until around 1330, then developing a sudden episode of dizziness and nausea, described as "a spinning sensation".  The patient fell unsteady to ambulate, and then the symptoms were followed by several episodes of nonbloody emesis.  He describes these episodes as similar to those of his cerebellar strokes in the past.  The patient denied any new vision changes, numbness, tingling, focal weakness, or slurred speech.  He denies any dysphagia.   He denies any abdominal pain, dysuria or gross hematuria.  He denies any food poisoning.  He denies any recent viral infections.  He denies any tobacco, alcohol or recreational drug use.    ED Course:  BP (!) 129/59   Pulse 75   Resp 15   Ht 5\' 7"  (1.702 m)   Wt 81.6 kg (180 lb)   SpO2 99%   BMI 28.19 kg/m    Code stroke on presentation was initiated.  On arrival, the patient was brought straight to CT scan, which did not show any acute stroke, but did show old left cerebellar stroke.  Per chart note, the patient to his Eliquis today, therefore he was not a candidate for TPA.  He also had a CT Angio of the head and neck, which did show old intracranial vascular abnormalities, but there are no new findings.  He is NIH SS 0.  Rankin scale is 0. Lactic acid 2.22. Urinalysis is negative  UDS pending. Creatinine 1.6. EKG sinus rhythm, without significant changes from prior EKG.  Normal QTC and QT He also received Reglan due to his vomiting, and he received Zofran for nausea, with significant improvement of  his symptoms. The echo was in May 2018, EF 55 to 87%, normal systolic grade 1 diastolic  Review of Systems:  As per HPI otherwise all other systems reviewed and are negative  Past Medical History:  Diagnosis Date  . Bruises easily    d/t being on Eliquis  . Carotid artery disease ([place])    a. Duplex 05/2014: 68-11% RICA, 5-72% LICA, elevated velocities in right subclavian artery, normal left subclavian artery..  . Chronic back pain    HNP  . Chronic kidney disease (CKD)   . Claustrophobia    takes Ativan as needed  . 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 Coronary atherosclerosis of unspecified type of vessel, native or graft    a. cath in 2003 showed 10-20% LM, scattered 20% prox-mid LAD, 30% more distal LAD, 50-60% stenosis of LAD towards apex, 20% Cx, 30% dRCA, focal 95% stenosis in smaller of 2 branches of PDA, EF 60-65%.  . Diabetes mellitus ([place])   . Diarrhea    takes Imodium daily as needed  . Diverticulosis   . Diverticulosis of colon (without mention of hemorrhage)   . DJD (degenerative joint disease)   . DVT (deep venous thrombosis) ([place]) 07/2011  . Esophageal stricture   . Essential hypertension    takes Imdur and Lisinopril daily  . Gastric polyps    benign  .  GERD (gastroesophageal reflux disease)    takes Nexium daily  . Glaucoma    uses eye drops  . History of bronchitis    not sure when the last time  . History of colon polyps    benign  . History of kidney stones   . History of kidney stones   . Insomnia    takes Melatonin nightly as needed  . Mixed hyperlipidemia    takes Crestor daily  . Myasthenia gravis ([place])    uses prednisone  . Osteoporosis 2016  . Peripheral edema    takes Lasix daily  . Pituitary tumor    checks [name] at [facility] checks it every Jan   . Pneumonia 01/2016  . PONV (postoperative nausea and vomiting)   . Ptosis   . Pulmonary embolus ([place]) 2012  . Stroke [facility]) 06/2015    Past Surgical History:   Procedure Laterality Date  . ABDOMINAL AORTOGRAM W/LOWER EXTREMITY N/A 11/18/2016   Procedure: ABDOMINAL AORTOGRAM W/LOWER EXTREMITY;  Surgeon: Tristan Hampshire, MD;  Location: [place] CV LAB;  Service: Cardiovascular;  Laterality: N/A;  . BACK SURGERY     x2, lumbar and cervical  . CARDIAC CATHETERIZATION    . CATARACT EXTRACTION Bilateral 12/12  . COLONOSCOPY    . ESOPHAGOGASTRODUODENOSCOPY ENDOSCOPY  04/2015  . IR GENERIC HISTORICAL  03/03/2016   IR IVC FILTER PLMT / S&I [name] GUID/MOD SED 03/03/2016 Tristan Keen, MD [facility]INTER[facility] RAD  . IR GENERIC HISTORICAL  04/09/2016   IR RADIOLOGIST EVAL & MGMT 04/09/2016 Tristan Keen, MD [facility] INTER[facility] RAD  . IR GENERIC HISTORICAL  04/30/2016   IR IVC FILTER RETRIEVAL / S&I [name] GUID/MOD SED 04/30/2016 Tristan Keen, MD [facility]-INTERV RAD  . LITHOTRIPSY    . LUMBAR LAMINECTOMY/DECOMPRESSION MICRODISCECTOMY Right 09/14/2012   Procedure: Right Lumbar three-four, four-five, Lumbar five-Sacral one decompressive laminectomy;  Surgeon: Tristan Moore, MD;  Location: [name] [facility]UR[facility] OR[facility];  Service: Neurosurgery;  Laterality: Right;  . LUMBAR LAMINECTOMY/DECOMPRESSION MICRODISCECTOMY Left 03/11/2016   Procedure: Left Lumbar Two-ThreeMicrodiscectomy;  Surgeon: Tristan Moore, MD;  Location: [place];  Service: Neurosurgery;  Laterality: Left;  . PERIPHERAL VASCULAR INTERVENTION  11/18/2016   Procedure: PERIPHERAL VASCULAR INTERVENTION;  Surgeon: Tristan Hampshire, MD;  Location: [place] CV LAB;  Service: Cardiovascular;;  Left popliteal  . SHOULDER SURGERY Left 05/07/2009  . TONSILLECTOMY      Social History Social History   Socioeconomic History  . Marital status: Married    Spouse name: [name]  . Number of children: 2  . Years of education: 1-College  . Highest education level: Not on file  Occupational History  . 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 Financial resource strain: Not on file  . Food insecurity:    Worry: Not  on file    Inability: Not on file  . Transportation needs:    Medical: Not on file    Non-medical: Not on file  Tobacco Use  . Smoking status: Never Smoker  . Smokeless tobacco: Never Used  Substance and Sexual Activity  . Alcohol use: No    Alcohol/week: 0.0 oz  . Drug use: No  . Sexual activity: Not Currently  Lifestyle  . Physical activity:    Days per week: Not on file    Minutes per session: Not on file  . Stress: Not on file  Relationships  . Social connections:    Talks on phone: Not on file  Gets together: Not on file    Attends religious service: Not on file    Active member of club or organization: Not on file    Attends meetings of clubs or organizations: Not on file    Relationship status: Not on file  . Intimate partner violence:    Fear of current or ex partner: Not on file    Emotionally abused: Not on file    Physically abused: Not on file    Forced sexual activity: Not on file  Other Topics Concern  . Not on file  Social History Narrative   Lives at home w/ his wife   Patient is right handed.   Patient has 1-2 cups of caffeine daily.     Allergies  Allergen Reactions  . Phenergan [Promethazine Hcl] Swelling and Other (See Comments)    Cannot be combined with Zofran because swelling of the eyes resulted and patient became very restless-  . Zofran [Ondansetron Hcl] Swelling and Other (See Comments)    Cannot be combined with Phenergan because swelling of the eyes resulted and patient became very restless-  . Sulfamethoxazole Rash and Itching    Family History  Problem Relation Age of Onset  . Heart disease Mother   . Heart attack Mother   . Stroke Mother   . Heart disease Father   . Heart attack Father   . Stroke Father   . Breast cancer Sister        Twin   . Heart attack Sister   . Dementia Sister   . Heart disease Sister   . Heart attack Sister   . Clotting disorder Sister   . Heart attack Sister   . Colon cancer Neg Hx         Prior to Admission medications   Medication Sig Start Date End Date Taking? Authorizing Provider  brimonidine-timolol (COMBIGAN) 0.2-0.5 % ophthalmic solution Place 1 drop into both eyes every 12 (twelve) hours.   Yes [provider]  cholecalciferol (VITAMIN D) 1000 units tablet Take 1,000 Units by mouth daily.    Yes [provider]  ELIQUIS 5 MG TABS tablet TAKE 1 TABLET (5 MG TOTAL) BY MOUTH 2 (TWO) TIMES DAILY. 12/09/15  Yes Kathrynn Ducking, MD  furosemide (LASIX) 20 MG tablet TAKE 1 TABLET BY MOUTH DAILY. 12/23/15  Yes Sherren Mocha, MD  imatinib (GLEEVEC) 100 MG tablet Take 300 mg by mouth daily at 12 noon. Take with meals and large glass of water.Caution:Chemotherapy    Yes [provider]  isosorbide mononitrate (IMDUR) 30 MG 24 hr tablet Take 1 tablet (30 mg total) by mouth daily. 09/17/15  Yes Sherren Mocha, MD  lidocaine (LIDODERM) 5 % Place 1 patch onto the skin daily as needed (for back pain). Remove & Discard patch within 12 hours or as directed by MD    Yes [provider]  lisinopril (PRINIVIL,ZESTRIL) 5 MG tablet Take 1 tablet (5 mg total) by mouth daily. 11/05/15  Yes Sherren Mocha, MD  LORazepam (ATIVAN) 0.5 MG tablet TAKE 1 TABLET BY MOUTH AT BEDTIME AS NEEDED FOR SLEEP 01/20/17  Yes Tristan Shin, MD  Melatonin 3 MG TABS Take 3 mg by mouth at bedtime as needed (for sleep).    Yes [provider]  Multiple Vitamin (MULTIVITAMIN) tablet Take 1 tablet by mouth daily.   Yes [provider]  predniSONE (DELTASONE) 1 MG tablet Take 3 tablets (3 mg total) by mouth daily with breakfast. 06/23/17  Yes Jannifer Franklin,  Elon Alas, MD  amoxicillin-clavulanate (AUGMENTIN) 875-125 MG tablet Take 1 tablet by mouth 2 (two) times daily. One po bid x 7 days Patient not taking: Reported on 07/19/2017 06/06/17   Daleen Bo, MD  Fluticasone-Salmeterol (ADVAIR DISKUS) 100-50 MCG/DOSE AEPB Inhale 1 puff into the lungs 2 (two) times daily. Patient  not taking: Reported on 07/19/2017 06/10/17   Tristan Shin, MD  loperamide (IMODIUM) 2 MG capsule Take 2-4 mg by mouth 3 (three) times daily as needed for diarrhea or loose stools (takes 1-2 tablets every and additional if neded). For diarrhea    [provider]  NITROSTAT 0.4 MG SL tablet PLACE 1 TABLET UNDER THE TONGUE EVERY 5 MINUTES X3 DOSES AS NEEDED FOR CHEST PAIN Patient not taking: Reported on 07/19/2017 11/19/15   Sherren Mocha, MD  ondansetron (ZOFRAN ODT) 8 MG disintegrating tablet Take 1 tablet (8 mg total) by mouth every 8 (eight) hours as needed for nausea or vomiting. 05/07/16   Armbruster, Carlota Raspberry, MD  predniSONE (DELTASONE) 20 MG tablet Take 1 tablet (20 mg total) by mouth 2 (two) times daily. Patient not taking: Reported on 07/19/2017 06/06/17   Daleen Bo, MD  promethazine-codeine [facility] WITH CODEINE) 6.25-10 MG/5ML syrup Take 2.5 mLs by mouth every 4 (four) hours as needed for cough. 06/10/17   Tristan Shin, MD  pyridostigmine (MESTINON) 60 MG tablet Take 60 mg by mouth daily.    [provider]  ranitidine (ZANTAC) 150 MG tablet Take 150 mg by mouth 2 (two) times daily as needed for heartburn.    [provider]  rosuvastatin (CRESTOR) 10 MG tablet Take 5 mg by mouth daily.    [provider]  traMADol (ULTRAM) 50 MG tablet Take 2 tablets (100 mg total) by mouth every 8 (eight) hours. 06/28/17   Montine Circle, PA-C     Physical Exam:  Vitals:   07/19/17 1500 07/19/17 1530 07/19/17 1545 07/19/17 1600  BP: (!) 144/62 (!) 133/58 116/70 (!) 129/59  Pulse: 69 72 91 75  Resp: 16 14 17 15   SpO2: 97% 98% 96% 99%  Weight:      Height:       Constitutional: NAD, ill-appearing, but alert, able to answer questions properly. Eyes: PERRL, lids and conjunctivae normal.  No proptosis during my exam.  There is some disconjugate in his gaze.  Mild nystagmus horizontally. ENMT: Mucous membranes are moist, without exudate or lesions  Neck:  normal, supple, no masses, no thyromegaly Respiratory: clear to auscultation bilaterally, no wheezing, no crackles. Normal respiratory effort  Cardiovascular: Regular rate and rhythm,  murmur, rubs or gallops. No extremity edema. 2+ pedal pulses. No carotid bruits.  Abdomen: Soft, obese, non tender, No hepatosplenomegaly. Bowel sounds positive.  Musculoskeletal: no clubbing / cyanosis. Moves all extremities Skin: no jaundice, No lesions.  Neurologic: Sensation intact  Strength equal in all extremities.  Normal finger-to-nose.  Resting tremor noted.  As mentioned above, some disconjugate vision in gaze, mild nystagmus. Psychiatric:   Alert and oriented x 3. Normal mood.     Labs on Admission: I have personally reviewed following labs and imaging studies  CBC: Recent Labs  Lab 07/19/17 1457 07/19/17 1522  WBC 9.1  --   NEUTROABS 6.4  --   HGB 12.5* 12.2*  HCT 37.3* 36.0*  MCV 98.2  --   PLT 188  --     Basic Metabolic Panel: Recent Labs  Lab 07/19/17 1457 07/19/17 1522  NA 140 139  K 4.0 4.2  CL 106 105  CO2 24  --   GLUCOSE 131* 129*  BUN 12 17  CREATININE 1.63* 1.60*  CALCIUM 9.3  --     GFR: Estimated Creatinine Clearance: 38.3 mL/min (A) (by C-G formula based on SCr of 1.6 mg/dL (H)).  Liver Function Tests: Recent Labs  Lab 07/19/17 1457  AST 31  ALT 25  ALKPHOS 46  BILITOT 1.1  PROT 5.9*  ALBUMIN 3.7   Recent Labs  Lab 07/19/17 1457  LIPASE 38   No results for input(s): AMMONIA in the last 168 hours.  Coagulation Profile: Recent Labs  Lab 07/19/17 1504  INR 1.25    Cardiac Enzymes: No results for input(s): CKTOTAL, CKMB, CKMBINDEX, TROPONINI in the last 168 hours.  BNP (last 3 results) No results for input(s): PROBNP in the last 8760 hours.  HbA1C: No results for input(s): HGBA1C in the last 72 hours.  CBG: Recent Labs  Lab 07/19/17 1448  GLUCAP 137*    Lipid Profile: No results for input(s): CHOL, HDL, LDLCALC, TRIG, CHOLHDL,  LDLDIRECT in the last 72 hours.  Thyroid Function Tests: No results for input(s): TSH, T4TOTAL, FREET4, T3FREE, THYROIDAB in the last 72 hours.  Anemia Panel: No results for input(s): VITAMINB12, FOLATE, FERRITIN, TIBC, IRON, RETICCTPCT in the last 72 hours.  Urine analysis:    Component Value Date/Time   COLORURINE YELLOW 06/19/2015 2107   APPEARANCEUR CLEAR 06/19/2015 2107   LABSPEC 1.014 06/19/2015 2107   PHURINE 5.5 06/19/2015 2107   GLUCOSEU NEGATIVE 06/19/2015 2107   GLUCOSEU NEGATIVE 04/09/2014 0910   HGBUR NEGATIVE 06/19/2015 2107   BILIRUBINUR NEGATIVE 06/19/2015 2107   KETONESUR 15 (A) 06/19/2015 2107   PROTEINUR NEGATIVE 06/19/2015 2107   UROBILINOGEN 1.0 04/09/2014 0910   NITRITE NEGATIVE 06/19/2015 2107   LEUKOCYTESUR NEGATIVE 06/19/2015 2107    Sepsis Labs: @LABRCNTIP (procalcitonin:4,lacticidven:4) )No results found for this or any previous visit (from the past 240 hour(s)).   Radiological Exams on Admission: Ct Angio Head W Or Wo Contrast  Result Date: 07/19/2017 CLINICAL DATA:  Follow up code stroke. History of hypertension, carotid artery disease. EXAM: CT ANGIOGRAPHY HEAD AND NECK TECHNIQUE: Multidetector CT imaging of the head and neck was performed using the standard protocol during bolus administration of intravenous contrast. Multiplanar CT image reconstructions and MIPs were obtained to evaluate the vascular anatomy. Carotid stenosis measurements (when applicable) are obtained utilizing NASCET criteria, using the distal internal carotid diameter as the denominator. CONTRAST:  148mL ISOVUE-370 IOPAMIDOL (ISOVUE-370) INJECTION 76% COMPARISON:  CT HEAD July 19, 2017 and CT angiogram head and neck Jul 03, 2016. FINDINGS: CTA NECK FINDINGS: AORTIC ARCH: Normal appearance of the thoracic arch, normal branch pattern. Mild calcific atherosclerosis aortic arch. The origins of the innominate, left Common carotid artery and subclavian artery are widely patent. RIGHT  CAROTID SYSTEM: Common carotid artery is patent. Severe calcific atherosclerosis and intimal thickening with similarly ulcerated RIGHT internal carotid artery origin versus old dissection. No hemodynamically significant stenosis by NASCET criteria. Patent internal carotid artery. LEFT CAROTID SYSTEM: Common carotid artery is patent. Mild calcific atherosclerosis of the carotid bifurcation without hemodynamically significant stenosis by NASCET criteria. Normal appearance of the internal carotid artery. VERTEBRAL ARTERIES:RIGHT vertebral artery is dominant. Moderate stenosis LEFT vertebral artery origin. Similarly attenuated and intermittently occluded LEFT vertebral artery with tandem reconstitution via muscular cutaneous branches. SKELETON: No acute osseous process though bone windows have not been submitted. Moderate canal stenosis C3-4, moderate to severe canal stenosis C4-5, moderate at C6-7. Moderate LEFT C2-3,  severe bilateral C3-4, severe bilateral C4-5 neural foraminal narrowing. OTHER NECK: Soft tissues of the neck are nonacute though, not tailored for evaluation. UPPER CHEST: Included lung apices are clear. No superior mediastinal lymphadenopathy. CTA HEAD FINDINGS: ANTERIOR CIRCULATION: Patent cervical internal carotid arteries, petrous, cavernous and supra clinoid internal carotid arteries. Patent anterior communicating artery. Patent anterior and middle cerebral arteries. No large vessel occlusion, significant stenosis, contrast extravasation or aneurysm. POSTERIOR CIRCULATION: Patent vertebral arteries, vertebrobasilar junction and basilar artery, as well as main branch vessels. Moderate calcific atherosclerosis RIGHT V4 segment resulting in mild stenosis. Luminal irregularity LEFT V4 segment is unchanged, possible dissection or collateral vessels, moderate stenosis mid to distal LEFT V4 segment. Patent posterior cerebral arteries. Robust bilateral posterior communicating arteries. Mild stenosis RIGHT  P1 segment. No large vessel occlusion, significant stenosis, contrast extravasation or aneurysm. VENOUS SINUSES: Major dural venous sinuses are patent though not tailored for evaluation on this angiographic examination. ANATOMIC VARIANTS: None. DELAYED PHASE: Not performed. MIP images reviewed. IMPRESSION: CTA neck: 1. No hemodynamically significant stenosis or acute vascular process. 2. 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 3. Similarly occluded proximal LEFT vertebral artery with reconstitution. 4. Moderate to severe canal stenosis C4-5. Severe neural foraminal narrowing C3-4 and C4-5. CTA head: 1. No emergent large vessel occlusion or flow-limiting stenosis. 2. Moderate stenosis LEFT V4 segment. Aortic Atherosclerosis (ICD10-I70.0). Critical Value/emergent results text paged to [place] via AMION secure system on 07/19/2017 at 3:37 pm, including interpreting physician's phone number. Electronically Signed   By: Elon Alas M.D.   On: 07/19/2017 15:49   Ct Angio Neck W Or Wo Contrast  Result Date: 07/19/2017 CLINICAL DATA:  Follow up code stroke. History of hypertension, carotid artery disease. EXAM: CT ANGIOGRAPHY HEAD AND NECK TECHNIQUE: Multidetector CT imaging of the head and neck was performed using the standard protocol during bolus administration of intravenous contrast. Multiplanar CT image reconstructions and MIPs were obtained to evaluate the vascular anatomy. Carotid stenosis measurements (when applicable) are obtained utilizing NASCET criteria, using the distal internal carotid diameter as the denominator. CONTRAST:  153mL ISOVUE-370 IOPAMIDOL (ISOVUE-370) INJECTION 76% COMPARISON:  CT HEAD July 19, 2017 and CT angiogram head and neck Jul 03, 2016. FINDINGS: CTA NECK FINDINGS: AORTIC ARCH: Normal appearance of the thoracic arch, normal branch pattern. Mild calcific atherosclerosis aortic arch. The origins of the innominate, left Common carotid  artery and subclavian artery are widely patent. RIGHT CAROTID SYSTEM: Common carotid artery is patent. Severe calcific atherosclerosis and intimal thickening with similarly ulcerated RIGHT internal carotid artery origin versus old dissection. No hemodynamically significant stenosis by NASCET criteria. Patent internal carotid artery. LEFT CAROTID SYSTEM: Common carotid artery is patent. Mild calcific atherosclerosis of the carotid bifurcation without hemodynamically significant stenosis by NASCET criteria. Normal appearance of the internal carotid artery. VERTEBRAL ARTERIES:RIGHT vertebral artery is dominant. Moderate stenosis LEFT vertebral artery origin. Similarly attenuated and intermittently occluded LEFT vertebral artery with tandem reconstitution via muscular cutaneous branches. SKELETON: No acute osseous process though bone windows have not been submitted. Moderate canal stenosis C3-4, moderate to severe canal stenosis C4-5, moderate at C6-7. Moderate LEFT C2-3, severe bilateral C3-4, severe bilateral C4-5 neural foraminal narrowing. OTHER NECK: Soft tissues of the neck are nonacute though, not tailored for evaluation. UPPER CHEST: Included lung apices are clear. No superior mediastinal lymphadenopathy. CTA HEAD FINDINGS: ANTERIOR CIRCULATION: Patent cervical internal carotid arteries, petrous, cavernous and supra clinoid internal carotid arteries. Patent anterior communicating artery. Patent anterior and middle cerebral arteries. No large  vessel occlusion, significant stenosis, contrast extravasation or aneurysm. POSTERIOR CIRCULATION: Patent vertebral arteries, vertebrobasilar junction and basilar artery, as well as main branch vessels. Moderate calcific atherosclerosis RIGHT V4 segment resulting in mild stenosis. Luminal irregularity LEFT V4 segment is unchanged, possible dissection or collateral vessels, moderate stenosis mid to distal LEFT V4 segment. Patent posterior cerebral arteries. Robust bilateral  posterior communicating arteries. Mild stenosis RIGHT P1 segment. No large vessel occlusion, significant stenosis, contrast extravasation or aneurysm. VENOUS SINUSES: Major dural venous sinuses are patent though not tailored for evaluation on this angiographic examination. ANATOMIC VARIANTS: None. DELAYED PHASE: Not performed. MIP images reviewed. IMPRESSION: CTA neck: 1. No hemodynamically significant stenosis or acute vascular process. 2. 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 3. Similarly occluded proximal LEFT vertebral artery with reconstitution. 4. Moderate to severe canal stenosis C4-5. Severe neural foraminal narrowing C3-4 and C4-5. CTA head: 1. No emergent large vessel occlusion or flow-limiting stenosis. 2. Moderate stenosis LEFT V4 segment. Aortic Atherosclerosis (ICD10-I70.0). Critical Value/emergent results text paged to [place] via AMION secure system on 07/19/2017 at 3:37 pm, including interpreting physician's phone number. Electronically Signed   By: Elon Alas M.D.   On: 07/19/2017 15:49   Ct Head Code Stroke Wo Contrast  Addendum Date: 07/19/2017   ADDENDUM REPORT: 07/19/2017 15:37 ADDENDUM: Critical Value/emergent results text paged to [place], Neurology via AMION secure system on 07/19/2017 at 3:32 pm, including interpreting physician's phone number. Electronically Signed   By: Elon Alas M.D.   On: 07/19/2017 15:37   Result Date: 07/19/2017 CLINICAL DATA:  Code stroke. Dizzy and nausea for an hour. History of stroke, hypertension, diabetes, carotid artery disease, pituitary tumor. EXAM: CT HEAD WITHOUT CONTRAST TECHNIQUE: Contiguous axial images were obtained from the base of the skull through the vertex without intravenous contrast. COMPARISON:  CT HEAD Jul 03, 2016. FINDINGS: BRAIN: No intraparenchymal hemorrhage, mass effect nor midline shift. The ventricles and sulci are normal for age. Patchy to confluent supratentorial white  matter hypodensities. Old LEFT cerebellar infarct. No acute large vascular territory infarcts. No abnormal extra-axial fluid collections. Basal cisterns are patent. VASCULAR: Moderate calcific atherosclerosis of the carotid siphons. No abnormally dense vessel. SKULL: No skull fracture. 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 No significant scalp soft tissue swelling. SINUSES/ORBITS: Partially imaged inspissated RIGHT maxillary sinusitis. Included ocular globes and orbital contents are non-suspicious. Status post bilateral ocular lens implants. OTHER: None. ASPECTS [facility] Stroke Program Early CT Score) - Ganglionic level infarction (caudate, lentiform nuclei, internal capsule, insula, M1-M3 cortex): 7 - Supraganglionic infarction (M4-M6 cortex): 3 Total score (0-10 with 10 being normal): 10 IMPRESSION: 1. No acute intracranial process. 2. ASPECTS is 10. 3. 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 Electronically Signed: By: Elon Alas M.D. On: 07/19/2017 15:34    EKG: Independently reviewed.  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  Symptoms may be exacerbated due to history of ocular myasthenia.  Not TPA candidate patient is  on Eliquis.  CT of the head negative for acute intracranial abnormalities.  CT Angio of the head and neck are unrevealing. EKG within limits of normal.  Deficits improved in the ED. he reports less dizziness, but gait has not been able to be tested at this time, as the patient still symptomatic.  NIH stroke score was 0, and ranking scale was 0.  Risk factors include age, male, diabetes, hypertension, prior history of strokes.  Tele Obs  MRI brain  Obtain 2 D Echo  Check hemoglobin A1c and lipid panel, as indicated by neurology continue Eliquis PT/ OT/SLP  Continue telemetry Quint neurochecks.  Hypertension Controlled.BP (!) 142/61   Pulse 75   Resp 18   Ht 5\' 7"  (1.702 m)   Wt 81.6 kg (180 lb)   SpO2 98%   BMI 28.19 kg/m  Hold anti-hypertensive medications as patient's symptoms have not completely resolved to create permissive hypertension to help prevent further TIA events  May use hydralazine for greater than 431 systolic or greater than 540 diastolic   History of CML, 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 Continue Gleevec in the morning.  History of ocular myasthenia Continue Mestinon Continue Chronic Tobramycin, eye drops   Hyperlipidemia Continue home statins   History of PE and DVT, remote.  Patient is on chronic Eliquis. Continue Eliquis during admission  Diastolic CHF: No acute decompensation weight 180 pounds   last 2 D echo in 2018, showed EF 55 to 08%, grade 1 diastolic, normal systolic.  New echo is pending. Resume home medications in the morning.  He is allowing permissive hypertension at this time. Obtain daily weights Monitor intake and output   Type II Diabetes Current blood sugar level is  131  Lab Results  Component Value Date   HGBA1C 5.9 04/07/2017  Hgb A1C SSI   GERD, no acute symptoms Continue PPI  DVT prophylaxis:  ELiquis  Code Status:    FUll Family Communication:  Discussed with patient Disposition Plan: Expect patient to be  discharged to home after condition improves Consults called:   Neuro, Dr. Rory Percy as per EDP  Admission status: Tele Obs    Sharene Butters, PA-C [facility]   Amion text  [phone]   07/19/2017, 4:29 PM

## 2017-07-19 NOTE — Consult Note (Addendum)
Requesting Physician: Dr. Kathrynn Humble    Chief Complaint: Dizziness which was called code stroke secondary to previous symptoms being the same  History obtained from:  Patient   Wife  HPI:                                                                                                                                         STEPHANE NIEMANN is an 77 y.o. 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  Patient is on Eliquis.  He took an Eliquis pill this morning.  Patient apparently was having lunch today, was at his baseline, at approximately 130pm patient suddenly became diaphoretic, staggering and had nausea vomiting.  Wife drove him to [facility] apparently he was significantly nauseous and vomiting during the way here.  On arrival patient was brought straight to CT scan which did not show any acute stroke but did show the old left cerebellar stroke.  As he took his Eliquis today we were unable to give TPA so a CTA was obtained and did show old intracranial vascular abnormalities but nothing new.  For this reason MRI will be obtained to evaluate for possible new stroke.  As he had no large vessel occlusion he was not a intra-arterial candidate.  Date last known well: Date: 07/19/2017 Time last known well: Time: 13:00 tPA Given: No: Mild symptoms NIHSS 0 Modified Rankin: Rankin Score=0   Past Medical History:  Diagnosis Date  . Bruises easily    d/t being on Eliquis  . Carotid artery disease ([place])    a. Duplex 05/2014: 67-34% RICA, 1-93% LICA, elevated velocities in right subclavian artery, normal left subclavian artery..  . Chronic back pain    HNP  . Chronic kidney disease (CKD)   . Claustrophobia    takes Ativan as needed  . CML (chronic myeloid leukemia) ([place])    takes Gleevec daily  . Coronary atherosclerosis of unspecified type of vessel, native or graft    a. cath in 2003 showed 10-20% LM, scattered 20% prox-mid LAD, 30% more distal LAD, 50-60% stenosis of LAD  towards apex, 20% Cx, 30% dRCA, focal 95% stenosis in smaller of 2 branches of PDA, EF 60-65%.  . Diabetes mellitus ([place])   . Diarrhea    takes Imodium daily as needed  . Diverticulosis   . Diverticulosis of colon (without mention of hemorrhage)   . DJD (degenerative joint disease)   . DVT (deep venous thrombosis) ([place]) 07/2011  . Esophageal stricture   . Essential hypertension    takes Imdur and Lisinopril daily  . Gastric polyps    benign  . GERD (gastroesophageal reflux disease)    takes Nexium daily  . Glaucoma    uses eye drops  . History of bronchitis    not sure when the last time  . History of colon polyps    benign  .  History of kidney stones   . History of kidney stones   . Insomnia    takes Melatonin nightly as needed  . Mixed hyperlipidemia    takes Crestor daily  . Myasthenia gravis ([place])    uses prednisone  . Osteoporosis 2016  . Peripheral edema    takes Lasix daily  . Pituitary tumor    [facility] checks it every Jan   . Pneumonia 01/2016  . PONV (postoperative nausea and vomiting)   . Ptosis   . Pulmonary embolus ([place]) 2012  . Stroke [facility]) 06/2015    Past Surgical History:  Procedure Laterality Date  . ABDOMINAL AORTOGRAM W/LOWER EXTREMITY N/A 11/18/2016   Procedure: ABDOMINAL AORTOGRAM W/LOWER EXTREMITY;  Surgeon: [name], MD;  Location: [place] CV LAB;  Service: Cardiovascular;  Laterality: N/A;  . BACK SURGERY     x2, lumbar and cervical  . CARDIAC CATHETERIZATION    . CATARACT EXTRACTION Bilateral 12/12  . COLONOSCOPY    . ESOPHAGOGASTRODUODENOSCOPY ENDOSCOPY  04/2015  . IR GENERIC HISTORICAL  03/03/2016   IR IVC FILTER PLMT / S&I [name] GUID/MOD SED 03/03/2016 [name], MD [facility]INTER[facility] RAD  . IR GENERIC HISTORICAL  04/09/2016   IR RADIOLOGIST EVAL & MGMT 04/09/2016 [name], MD [facility] INTER[facility] RAD  . IR GENERIC HISTORICAL  04/30/2016   IR IVC FILTER RETRIEVAL / S&I [name] GUID/MOD SED 04/30/2016 [name], MD  [facility]-INTERV RAD  . LITHOTRIPSY    . LUMBAR LAMINECTOMY/DECOMPRESSION MICRODISCECTOMY Right 09/14/2012   Procedure: Right Lumbar three-four, four-five, Lumbar five-Sacral one decompressive laminectomy;  Surgeon: [name], MD;  Location: [place] NEURO ORS;  Service: Neurosurgery;  Laterality: Right;  . LUMBAR LAMINECTOMY/DECOMPRESSION MICRODISCECTOMY Left 03/11/2016   Procedure: Left Lumbar Two-ThreeMicrodiscectomy;  Surgeon: [name], MD;  Location: [place];  Service: Neurosurgery;  Laterality: Left;  . PERIPHERAL VASCULAR INTERVENTION  11/18/2016   Procedure: PERIPHERAL VASCULAR INTERVENTION;  Surgeon: [name], MD;  Location: [place] CV LAB;  Service: Cardiovascular;;  Left popliteal  . SHOULDER SURGERY Left 05/07/2009  . TONSILLECTOMY      Family History  Problem Relation Age of Onset  . Heart disease Mother   . Heart attack Mother   . Stroke Mother   . Heart disease Father   . Heart attack Father   . Stroke Father   . Breast cancer Sister        Twin   . Heart attack Sister   . Dementia Sister   . Heart disease Sister   . Heart attack Sister   . Clotting disorder Sister   . Heart attack Sister   . Colon cancer Neg Hx        Social History:  reports that he has never smoked. He has never used smokeless tobacco. He reports that he does not drink alcohol or use drugs.  Allergies:  Allergies  Allergen Reactions  . Phenergan [Promethazine Hcl] Swelling    Cannot be combined with Zofran because swelling of the eyes resulted and patient became very restless-  . Zofran [Ondansetron Hcl] Other (See Comments)    Cannot be combined with Phenergan because swelling of the eyes resulted and patient became very restless-  . Sulfamethoxazole Rash and Itching    Medications:  Current Facility-Administered Medications  Medication Dose Route Frequency  Provider Last Rate Last Dose  . iopamidol (ISOVUE-370) 76 % injection            Current Outpatient Medications  Medication Sig Dispense Refill  . amoxicillin-clavulanate (AUGMENTIN) 875-125 MG tablet Take 1 tablet by mouth 2 (two) times daily. One po bid x 7 days (Patient not taking: Reported on 06/29/2017) 14 tablet 0  . brimonidine-timolol (COMBIGAN) 0.2-0.5 % ophthalmic solution Place 1 drop into both eyes every 12 (twelve) hours.    . cholecalciferol (VITAMIN D) 1000 units tablet Take 1,000 Units by mouth daily.     Marland Kitchen ELIQUIS 5 MG TABS tablet TAKE 1 TABLET (5 MG TOTAL) BY MOUTH 2 (TWO) TIMES DAILY. 60 tablet 3  . Fluticasone-Salmeterol (ADVAIR DISKUS) 100-50 MCG/DOSE AEPB Inhale 1 puff into the lungs 2 (two) times daily. (Patient not taking: Reported on 06/29/2017) 1 each 1  . furosemide (LASIX) 20 MG tablet TAKE 1 TABLET BY MOUTH DAILY. 90 tablet 2  . imatinib (GLEEVEC) 100 MG tablet Take 300 mg by mouth daily at 12 noon. Take with meals and large glass of water.Caution:Chemotherapy     . isosorbide mononitrate (IMDUR) 30 MG 24 hr tablet Take 1 tablet (30 mg total) by mouth daily. 90 tablet 3  . lidocaine (LIDODERM) 5 % Place 1 patch onto the skin daily. Remove & Discard patch within 12 hours or as directed by MD     . lisinopril (PRINIVIL,ZESTRIL) 5 MG tablet Take 1 tablet (5 mg total) by mouth daily. 30 tablet 11  . loperamide (IMODIUM) 2 MG capsule Take 2-4 mg by mouth 3 (three) times daily as needed for diarrhea or loose stools (takes 1-2 tablets every and additional if neded). For diarrhea    . LORazepam (ATIVAN) 0.5 MG tablet TAKE 1 TABLET BY MOUTH AT BEDTIME AS NEEDED FOR SLEEP (Patient not taking: Reported on 06/29/2017) 30 tablet 3  . Melatonin 3 MG TABS Take 3 mg by mouth at bedtime as needed (for sleep).     . Multiple Vitamin (MULTIVITAMIN) tablet Take 1 tablet by mouth daily.    Marland Kitchen NITROSTAT 0.4 MG SL tablet PLACE 1 TABLET UNDER THE TONGUE EVERY 5 MINUTES X3 DOSES AS NEEDED FOR  CHEST PAIN (Patient not taking: Reported on 06/29/2017) 100 tablet 1  . ondansetron (ZOFRAN ODT) 8 MG disintegrating tablet Take 1 tablet (8 mg total) by mouth every 8 (eight) hours as needed for nausea or vomiting. (Patient not taking: Reported on 06/29/2017) 90 tablet 1  . predniSONE (DELTASONE) 1 MG tablet Take 3 tablets (3 mg total) by mouth daily with breakfast. 270 tablet 1  . predniSONE (DELTASONE) 20 MG tablet Take 1 tablet (20 mg total) by mouth 2 (two) times daily. (Patient not taking: Reported on 06/29/2017) 10 tablet 0  . promethazine-codeine (PHENERGAN WITH CODEINE) 6.25-10 MG/5ML syrup Take 2.5 mLs by mouth every 4 (four) hours as needed for cough. (Patient not taking: Reported on 06/29/2017) 120 mL 0  . pyridostigmine (MESTINON) 60 MG tablet Take 60 mg by mouth daily.    . ranitidine (ZANTAC) 150 MG tablet Take 150 mg by mouth 2 (two) times daily as needed for heartburn.    . rosuvastatin (CRESTOR) 10 MG tablet Take 5 mg by mouth daily.    . traMADol (ULTRAM) 50 MG tablet Take 2 tablets (100 mg total) by mouth every 8 (eight) hours. 15 tablet 0     ROS:  History obtained from the patient  General ROS: Positive for - chills, fatigue, Psychological ROS: negative for - , hallucinations, memory difficulties, mood swings or  Ophthalmic ROS: negative for - blurry vision, double vision, eye pain or loss of vision ENT ROS: negative for - epistaxis, nasal discharge, oral lesions, sore throat, tinnitus or vertigo Respiratory ROS: negative for - cough,  shortness of breath or wheezing Cardiovascular ROS: negative for - chest pain, dyspnea on exertion,  Gastrointestinal ROS: negative for - abdominal pain, diarrhea,  nausea/vomiting or stool incontinence Genito-Urinary ROS: negative for - dysuria, hematuria, incontinence or urinary frequency/urgency Musculoskeletal  ROS: negative for - joint swelling or muscular weakness Neurological ROS: as noted in HPI   General Examination:                                                                                                      Blood pressure (!) 133/58, pulse 72, resp. rate 14, height 5\' 7"  (1.702 m), weight 81.6 kg (180 lb), SpO2 98 %.  HEENT-  Normocephalic, no lesions, without obvious abnormality.  Normal external eye and conjunctiva.   Musculoskeletal-no joint tenderness, deformity or swelling Skin-warm and dry, no hyperpigmentation, vitiligo, or suspicious lesions  Neurological Examination Mental Status: Alert, oriented, thought content appropriate.  Speech fluent without evidence of aphasia.  Able to follow 3 step commands without difficulty. Cranial Nerves: II:  Visual fields grossly normal,  III,IV, VI: ptosis not present, extra-ocular motions intact bilaterally, pupils equal, round, reactive to light and accommodation--it is noted that when patient looks forward he has no nystagmus when looking to the right he has significant nystagmus with fast beating to the right.  When he looks to the left as he hits midline from midline to the left he has no nystagmus. V,VII: smile symmetric--patient does not have any facial droop when smiling, facial light touch sensation normal bilaterally VIII: hearing normal bilaterally IX,X: uvula rises symmetrically XI: bilateral shoulder shrug XII: midline tongue extension Motor: Right : Upper extremity   5/5    Left:     Upper extremity   5/5  Lower extremity   5/5     Lower extremity   5/5 Tone and bulk:normal tone throughout; no atrophy noted Sensory: Pinprick and light touch intact throughout, bilaterally Deep Tendon Reflexes: 2+ and symmetric throughout Plantars: Right: downgoing   Left: downgoing Cerebellar: normal finger-to-nose --significant postural tremor bilaterally in the arms.  Mild dysmetria on the right however this may be due to his  tremor. Gait: Not tested   Lab Results: Basic Metabolic Panel: Recent Labs  Lab 07/19/17 1522  NA 139  K 4.2  CL 105  GLUCOSE 129*  BUN 17  CREATININE 1.60*    CBC: Recent Labs  Lab 07/19/17 1522  HGB 12.2*  HCT 36.0*    Lipid Panel: No results for input(s): CHOL, TRIG, HDL, CHOLHDL, VLDL, LDLCALC in the last 168 hours.  CBG: Recent Labs  Lab 07/19/17 1448  GLUCAP 137*    Imaging: Ct Head Code Stroke Wo Contrast  Addendum Date: 07/19/2017   ADDENDUM REPORT:  07/19/2017 15:37 ADDENDUM: Critical Value/emergent results text paged to [place], Neurology via AMION secure system on 07/19/2017 at 3:32 pm, including interpreting physician's phone number. Electronically Signed   By: Elon Alas M.D.   On: 07/19/2017 15:37   Result Date: 07/19/2017 CLINICAL DATA:  Code stroke. Dizzy and nausea for an hour. History of stroke, hypertension, diabetes, carotid artery disease, pituitary tumor. EXAM: CT HEAD WITHOUT CONTRAST TECHNIQUE: Contiguous axial images were obtained from the base of the skull through the vertex without intravenous contrast. COMPARISON:  CT HEAD Jul 03, 2016. FINDINGS: BRAIN: No intraparenchymal hemorrhage, mass effect nor midline shift. The ventricles and sulci are normal for age. Patchy to confluent supratentorial white matter hypodensities. Old LEFT cerebellar infarct. No acute large vascular territory infarcts. No abnormal extra-axial fluid collections. Basal cisterns are patent. VASCULAR: Moderate calcific atherosclerosis of the carotid siphons. No abnormally dense vessel. SKULL: No skull fracture. 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 No significant scalp soft tissue swelling. SINUSES/ORBITS: Partially imaged inspissated RIGHT maxillary sinusitis. Included ocular globes and orbital contents are non-suspicious. Status post bilateral ocular lens implants. OTHER: None. ASPECTS [facility] Stroke Program Early CT Score) - Ganglionic level  infarction (caudate, lentiform nuclei, internal capsule, insula, M1-M3 cortex): 7 - Supraganglionic infarction (M4-M6 cortex): 3 Total score (0-10 with 10 being normal): 10 IMPRESSION: 1. No acute intracranial process. 2. ASPECTS is 10. 3. 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 Electronically Signed: By: Elon Alas M.D. On: 07/19/2017 15:34    Assessment and plan discussed with with attending physician and they are in agreement.    Etta Quill PA-C [facility] [phone]  07/19/2017, 3:49 PM   Attending addendum Patient seen and examined Noncontrast CT of the head shows old stroke.  No new strokes. CT angios head and neck- occluded left lower, that is chronic.  Moderate stenosis of left V4.  No change from prior imaging modalities.  Assessment: 77 y.o. male is into the hospital with symptoms very similar to previous stroke.  CT scan of brain did not show any acute stroke, CTA of head and neck did not show any acute normalities different from previous scan.  NIH stroke scale of 0.  This could all be peripheral but given that his prior stroke exactly similar presentation, he would benefit from a stroke risk factor work-up.  Stroke Risk Factors - diabetes mellitus and hypertension  Recommend --HgbA1c, fasting lipid panel --MRA of the brain without contrast --PT consult, OT consult, Speech consult --Echocardiogram --80 mg of Atorvistatin --Prophylactic therapy-Antiplatelet med: --Continue anticoagulant --Telemetry monitoring --Frequent neuro checks --NPO until passes stroke swallow screen --Symptomatic treatment of nausea and vomiting with Zofran and meclizine --please page stroke NP  Or  PA  Or MD from 8am -4 pm  as this patient from this time will be  followed by the stroke.   You can look them up on [email or web address]  Password [email or web address]  -- Amie Portland, MD [facility] Pager: [phone] If 7pm to 7am,  please call on call as listed on AMION.   CRITICAL CARE ATTESTATION This patient is critically ill and at significant risk of neurological worsening, death and care requires constant monitoring of vital signs, hemodynamics,respiratory and cardiac monitoring. I spent 45  minutes of neurocritical care time performing neurological assessment, discussion with family, other specialists and medical decision making of high complexityin the care of  this patient.

## 2017-07-19 NOTE — ED Provider Notes (Signed)
[facility] EMERGENCY DEPARTMENT Provider Note   CSN: [id number] Arrival date & time: 07/19/17  1418     History   Chief Complaint Chief Complaint  Patient presents with  . Dizziness    HPI SABIEN UMLAND is a 77 y.o. male.  HPI  77 year old male comes in with chief complaint of nausea and dizziness. Patient has history of multiple strokes, CAD, hyperlipidemia and hypertension.  Patient was last normal around 1:30 in the afternoon, which is when he started developing dizziness and nausea.  Patient describes his dizziness is spinning sensation.  He was able to ambulate, however was unsteady.  Patient has had multiple episodes of vomiting.   Patient has history of strokes, with the stroke last year presenting with dizziness and nausea.  Review of system is negative for any vision changes, numbness, tingling, focal weakness, slurred speech.  Past Medical History:  Diagnosis Date  . Bruises easily    d/t being on Eliquis  . Carotid artery disease ([place])    a. Duplex 05/2014: 44-31% RICA, 5-40% LICA, elevated velocities in right subclavian artery, normal left subclavian artery..  . Chronic back pain    HNP  . Chronic kidney disease (CKD)   . Claustrophobia    takes Ativan as needed  . 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 Coronary atherosclerosis of unspecified type of vessel, native or graft    a. cath in 2003 showed 10-20% LM, scattered 20% prox-mid LAD, 30% more distal LAD, 50-60% stenosis of LAD towards apex, 20% Cx, 30% dRCA, focal 95% stenosis in smaller of 2 branches of PDA, EF 60-65%.  . Diabetes mellitus ([place])   . Diarrhea    takes Imodium daily as needed  . Diverticulosis   . Diverticulosis of colon (without mention of hemorrhage)   . DJD (degenerative joint disease)   . DVT (deep venous thrombosis) ([place]) 07/2011  . Esophageal stricture   . Essential hypertension    takes Imdur and Lisinopril daily  . Gastric polyps    benign  . GERD (gastroesophageal reflux disease)    takes Nexium daily  . Glaucoma    uses eye drops  . History of bronchitis    not sure when the last time  . History of colon polyps    benign  . History of kidney stones   . History of kidney stones   . Insomnia    takes Melatonin nightly as needed  . Mixed hyperlipidemia    takes Crestor daily  . Myasthenia gravis ([place])    uses prednisone  . Osteoporosis 2016  . Peripheral edema    takes Lasix daily  . Pituitary tumor    checks [facility]Walden at [facility] checks it every Jan   . Pneumonia 01/2016  . PONV (postoperative nausea and vomiting)   . Ptosis   . Pulmonary embolus ([place]) 2012  . Stroke [facility]) 06/2015    Patient Active Problem List   Diagnosis Date Noted  . Dizziness 07/19/2017  . Wheezing 06/17/2017  . Bursitis of left elbow 05/01/2017  . Dystonia 12/29/2016  . PAD (peripheral artery disease) ([place]) 11/18/2016  . Osteoporosis 08/28/2016  . Paronychia of great toe, left 08/10/2016  . Carotid arterial disease ([place]) 07/09/2016  . Hypocalcemia 07/09/2016  . Diplopia 07/02/2016  . S/P lumbar laminectomy 03/11/2016  . Radicular pain of left lower extremity 02/21/2016  . Cough 01/10/2016  . 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  Myasthenia gravis ([place])   . Diabetes mellitus type 2 in nonobese (HCC)   . Coronary artery disease involving native coronary artery of native heart without angina pectoris   . Acute blood loss anemia   . Ataxia, post-stroke   . Gait disturbance, post-stroke   . Proximal leg weakness   . Cervical spondylosis without myelopathy   . Achilles tendinitis of right lower extremity   . Chronic diastolic CHF (congestive heart failure) ([place])   . Gastroesophageal reflux disease without esophagitis   . Chronic kidney disease   . 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 Cerebellar stroke ([place])   . Ischemic stroke ([place])   . Cerebrovascular accident  (CVA) due to thrombosis of left vertebral artery ([place])   . History of pulmonary embolus (PE)   . Chronic anticoagulation   . HLD (hyperlipidemia)   . Neck pain 03/19/2015  . Renal insufficiency 03/13/2015  . Pain in the chest   . 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 Chest pain syndrome 03/07/2015  . Chronic diastolic heart failure, NYHA class 1 ([place]) 03/07/2015  . Diabetic gastroparesis ([place]) 03/07/2015  . Screening for osteoporosis 07/19/2014  . Edema 04/09/2014  . Routine general medical examination at a health care facility 10/23/2013  . Bilateral carotid bruits 05/24/2013  . Abnormal CT scan, lung 05/24/2013  . Encounter for therapeutic drug monitoring 03/14/2013  . Lymphadenopathy, inguinal 01/11/2013  . AKI (acute kidney injury) ([place]) 10/28/2012  . 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 Myasthenia gravis with exacerbation ([place]) 03/31/2012  . Disease of salivary gland 12/09/2011  . Left facial pain 10/28/2011  . Pulmonary embolism ([place]) 10/25/2011  . 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 GERD with stricture 09/23/2011  . Long term (current) use of anticoagulants 08/03/2011  . History of pulmonary embolism 07/30/2011  . History of DVT (deep vein thrombosis) 07/30/2011  . Subconjunctival hemorrhage 06/22/2011  . Bilateral conjunctivitis 04/09/2011  . Diabetes mellitus type 2, controlled ([place]) 12/18/2010  . Hearing loss 08/04/2010  . Ptosis of eyelid 12/30/2009  . Essential hypertension 04/24/2009  . HYPERLIPIDEMIA, MIXED 10/05/2008  . HEMORRHOIDS-INTERNAL 07/02/2008  . HEMORRHOIDS-EXTERNAL 07/02/2008  . DIVERTICULOSIS-COLON 07/02/2008  . PERSONAL HX COLONIC POLYPS 07/02/2008  . RASH-NONVESICULAR 02/07/2008  . PNEUMONIA 12/08/2007  . Chronic myeloid leukemia in remission ([place]) 07/07/2007  . CAD (coronary artery disease) 07/07/2007  . NEPHROLITHIASIS, HX OF 07/07/2007    Past Surgical History:  Procedure Laterality Date  .  ABDOMINAL AORTOGRAM W/LOWER EXTREMITY N/A 11/18/2016   Procedure: ABDOMINAL AORTOGRAM W/LOWER EXTREMITY;  Surgeon: [name], MD;  Location: [facility];  Service: Cardiovascular;  Laterality: N/A;  . BACK SURGERY     x2, lumbar and cervical  . CARDIAC CATHETERIZATION    . CATARACT EXTRACTION Bilateral 12/12  . COLONOSCOPY    . ESOPHAGOGASTRODUODENOSCOPY ENDOSCOPY  04/2015  . IR GENERIC HISTORICAL  03/03/2016   IR IVC FILTER PLMT / S&I [name] GUID/MOD SED 03/03/2016 [name], MD [facility]INTER[facility] RAD  . IR GENERIC HISTORICAL  04/09/2016   IR RADIOLOGIST EVAL & MGMT 04/09/2016 [name], MD [facility] INTER[facility] RAD  . IR GENERIC HISTORICAL  04/30/2016   IR IVC FILTER RETRIEVAL / S&I [name] GUID/MOD SED 04/30/2016 [name], MD [facility]-INTERV RAD  . LITHOTRIPSY    . LUMBAR LAMINECTOMY/DECOMPRESSION MICRODISCECTOMY Right 09/14/2012   Procedure: Right Lumbar three-four, four-five, Lumbar five-Sacral one decompressive laminectomy;  Surgeon: [name], MD;  Location: [facility];  Service: Neurosurgery;  Laterality: Right;  . LUMBAR LAMINECTOMY/DECOMPRESSION MICRODISCECTOMY Left 03/11/2016   Procedure: Left Lumbar Two-ThreeMicrodiscectomy;  Surgeon: [name], MD;  Location: [facility];  Service: Neurosurgery;  Laterality: Left;  . PERIPHERAL VASCULAR INTERVENTION  11/18/2016   Procedure: PERIPHERAL VASCULAR INTERVENTION;  Surgeon: [name], MD;  Location: [facility];  Service: Cardiovascular;;  Left popliteal  . SHOULDER SURGERY Left 05/07/2009  . TONSILLECTOMY          Home Medications    Prior to Admission medications   Medication Sig Start Date End Date Taking? Authorizing Provider  brimonidine-timolol (COMBIGAN) 0.2-0.5 % ophthalmic solution Place 1 drop into both eyes every 12 (twelve) hours.   Yes [provider]  cholecalciferol (VITAMIN D) 1000 units tablet Take 1,000 Units by mouth daily.    Yes [provider]  ELIQUIS 5 MG TABS tablet TAKE 1  TABLET (5 MG TOTAL) BY MOUTH 2 (TWO) TIMES DAILY. 12/09/15  Yes Kathrynn Ducking, MD  furosemide (LASIX) 20 MG tablet TAKE 1 TABLET BY MOUTH DAILY. 12/23/15  Yes Sherren Mocha, MD  imatinib (GLEEVEC) 100 MG tablet Take 300 mg by mouth daily at 12 noon. Take with meals and large glass of water.Caution:Chemotherapy    Yes [provider]  isosorbide mononitrate (IMDUR) 30 MG 24 hr tablet Take 1 tablet (30 mg total) by mouth daily. 09/17/15  Yes Sherren Mocha, MD  lidocaine (LIDODERM) 5 % Place 1 patch onto the skin daily as needed (for back pain). Remove & Discard patch within 12 hours or as directed by MD    Yes [provider]  lisinopril (PRINIVIL,ZESTRIL) 5 MG tablet Take 1 tablet (5 mg total) by mouth daily. 11/05/15  Yes Sherren Mocha, MD  loperamide (IMODIUM) 2 MG capsule Take 2-4 mg by mouth 3 (three) times daily as needed for diarrhea or loose stools (takes 1-2 tablets every and additional if neded). For diarrhea   Yes [provider]  LORazepam (ATIVAN) 0.5 MG tablet TAKE 1 TABLET BY MOUTH AT BEDTIME AS NEEDED FOR SLEEP 01/20/17  Yes Renato Shin, MD  Melatonin 3 MG TABS Take 3 mg by mouth at bedtime as needed (for sleep).    Yes [provider]  Multiple Vitamin (MULTIVITAMIN) tablet Take 1 tablet by mouth daily.   Yes [provider]  ondansetron (ZOFRAN ODT) 8 MG disintegrating tablet Take 1 tablet (8 mg total) by mouth every 8 (eight) hours as needed for nausea or vomiting. 05/07/16  Yes Armbruster, Carlota Raspberry, MD  predniSONE (DELTASONE) 1 MG tablet Take 3 tablets (3 mg total) by mouth daily with breakfast. 06/23/17  Yes Kathrynn Ducking, MD  pyridostigmine (MESTINON) 60 MG tablet Take 60 mg by mouth daily.   Yes [provider]  ranitidine (ZANTAC) 150 MG tablet Take 150 mg by mouth 2 (two) times daily as needed for heartburn.   Yes [provider]  rosuvastatin (CRESTOR) 10 MG tablet Take 5 mg by mouth daily.   Yes [provider]  tobramycin (TOBREX) 0.3 % ophthalmic solution Place 1 drop into both eyes 4 (four) times daily. FOR 3 WEEKS 07/12/17  Yes [provider]  traMADol (ULTRAM) 50 MG tablet Take 2 tablets (100 mg total) by mouth every 8 (eight) hours. Patient taking differently: Take 50 mg by mouth every 8 (eight) hours as needed (for pain).  06/28/17  Yes Montine Circle, PA-C  amoxicillin-clavulanate (AUGMENTIN) 875-125 MG tablet Take 1 tablet by  mouth 2 (two) times daily. One po bid x 7 days Patient not taking: Reported on 07/19/2017 06/06/17   Daleen Bo, MD  Fluticasone-Salmeterol (ADVAIR DISKUS) 100-50 MCG/DOSE AEPB Inhale 1 puff into the lungs 2 (two) times daily. Patient not taking: Reported on 07/19/2017 06/10/17   Renato Shin, MD  NITROSTAT 0.4 MG SL tablet PLACE 1 TABLET UNDER THE TONGUE EVERY 5 MINUTES X3 DOSES AS NEEDED FOR CHEST PAIN Patient not taking: Reported on 07/19/2017 11/19/15   Sherren Mocha, MD  predniSONE (DELTASONE) 20 MG tablet Take 1 tablet (20 mg total) by mouth 2 (two) times daily. Patient not taking: Reported on 07/19/2017 06/06/17   Daleen Bo, MD  promethazine-codeine [facility] WITH CODEINE) 6.25-10 MG/5ML syrup Take 2.5 mLs by mouth every 4 (four) hours as needed for cough. Patient not taking: Reported on 07/19/2017 06/10/17   Renato Shin, MD    Family History Family History  Problem Relation Age of Onset  . Heart disease Mother   . Heart attack Mother   . Stroke Mother   . Heart disease Father   . Heart attack Father   . Stroke Father   . Breast cancer Sister        Twin   . Heart attack Sister   . Dementia Sister   . Heart disease Sister   . Heart attack Sister   . Clotting disorder Sister   . Heart attack Sister   . Colon cancer Neg Hx     Social History Social History   Tobacco Use  . Smoking status: Never Smoker  . Smokeless tobacco: Never Used  Substance Use Topics  . Alcohol use: No    Alcohol/week: 0.0 oz  . Drug use: No      Allergies   Phenergan [promethazine hcl]; Zofran [ondansetron hcl]; and Sulfamethoxazole   Review of Systems Review of Systems  Constitutional: Positive for activity change.  Respiratory: Negative for shortness of breath.   Cardiovascular: Negative for chest pain.  Gastrointestinal: Positive for nausea and vomiting.  Allergic/Immunologic: Negative for immunocompromised state.  Neurological: Positive for dizziness.  Hematological: Does not bruise/bleed easily.  All other systems reviewed and are negative.    Physical Exam Updated Vital Signs BP (!) 129/59   Pulse 75   Resp 15   Ht 5\' 7"  (1.702 m)   Wt 81.6 kg (180 lb)   SpO2 99%   BMI 28.19 kg/m   Physical Exam  Constitutional: He is oriented to person, place, and time. He appears well-developed.  HENT:  Head: Atraumatic.  Eyes: Pupils are equal, round, and reactive to light.  Patient has psychotic eye movement, and horizontal nystagmus -particularly when the gazes towards patient's right side  Neck: Neck supple.  Cardiovascular: Normal rate.  Pulmonary/Chest: Effort normal.  Neurological: He is alert and oriented to person, place, and time. No cranial nerve deficit or sensory deficit. Coordination normal.  Upper and lower extremity strengths are normal.  Patient does not have any finger-to-nose dysmetria.  Skin: Skin is warm.  Nursing note and vitals reviewed.    ED Treatments / Results  Labs (all labs ordered are listed, but only abnormal results are displayed) Labs Reviewed  COMPREHENSIVE METABOLIC PANEL - Abnormal; Notable for the following components:      Result Value   Glucose, Bld 131 (*)    Creatinine, Ser 1.63 (*)    Total Protein 5.9 (*)    GFR calc non Af Amer 38 (*)    GFR calc Af Amer 45 (*)  All other components within normal limits  CBC - Abnormal; Notable for the following components:   RBC 3.80 (*)    Hemoglobin 12.5 (*)    HCT 37.3 (*)    All other components within normal limits   PROTIME-INR - Abnormal; Notable for the following components:   Prothrombin Time 15.6 (*)    All other components within normal limits  CBG MONITORING, ED - Abnormal; Notable for the following components:   Glucose-Capillary 137 (*)    All other components within normal limits  I-STAT CG4 LACTIC ACID, ED - Abnormal; Notable for the following components:   Lactic Acid, Venous 2.22 (*)    All other components within normal limits  I-STAT CHEM 8, ED - Abnormal; Notable for the following components:   Creatinine, Ser 1.60 (*)    Glucose, Bld 129 (*)    Hemoglobin 12.2 (*)    HCT 36.0 (*)    All other components within normal limits  LIPASE, BLOOD  APTT  DIFFERENTIAL  URINALYSIS, ROUTINE W REFLEX MICROSCOPIC  RAPID URINE DRUG SCREEN, HOSP PERFORMED  I-STAT TROPONIN, ED    EKG EKG Interpretation  Date/Time:  Monday July 19 2017 14:58:16 EDT Ventricular Rate:  71 PR Interval:    QRS Duration: 76 QT Interval:  405 QTC Calculation: 441 R Axis:   -14 Text Interpretation:  Sinus rhythm Low voltage, precordial leads Consider anterior infarct No acute changes No significant change since last tracing Confirmed by Varney Biles [phone]) on 07/19/2017 3:32:41 PM   Radiology Ct Angio Head W Or Wo Contrast  Result Date: 07/19/2017 CLINICAL DATA:  Follow up code stroke. History of hypertension, carotid artery disease. EXAM: CT ANGIOGRAPHY HEAD AND NECK TECHNIQUE: Multidetector CT imaging of the head and neck was performed using the standard protocol during bolus administration of intravenous contrast. Multiplanar CT image reconstructions and MIPs were obtained to evaluate the vascular anatomy. Carotid stenosis measurements (when applicable) are obtained utilizing NASCET criteria, using the distal internal carotid diameter as the denominator. CONTRAST:  177mL ISOVUE-370 IOPAMIDOL (ISOVUE-370) INJECTION 76% COMPARISON:  CT HEAD July 19, 2017 and CT angiogram head and neck Jul 03, 2016. FINDINGS:  CTA NECK FINDINGS: AORTIC ARCH: Normal appearance of the thoracic arch, normal branch pattern. Mild calcific atherosclerosis aortic arch. The origins of the innominate, left Common carotid artery and subclavian artery are widely patent. RIGHT CAROTID SYSTEM: Common carotid artery is patent. Severe calcific atherosclerosis and intimal thickening with similarly ulcerated RIGHT internal carotid artery origin versus old dissection. No hemodynamically significant stenosis by NASCET criteria. Patent internal carotid artery. LEFT CAROTID SYSTEM: Common carotid artery is patent. Mild calcific atherosclerosis of the carotid bifurcation without hemodynamically significant stenosis by NASCET criteria. Normal appearance of the internal carotid artery. VERTEBRAL ARTERIES:RIGHT vertebral artery is dominant. Moderate stenosis LEFT vertebral artery origin. Similarly attenuated and intermittently occluded LEFT vertebral artery with tandem reconstitution via muscular cutaneous branches. SKELETON: No acute osseous process though bone windows have not been submitted. Moderate canal stenosis C3-4, moderate to severe canal stenosis C4-5, moderate at C6-7. Moderate LEFT C2-3, severe bilateral C3-4, severe bilateral C4-5 neural foraminal narrowing. OTHER NECK: Soft tissues of the neck are nonacute though, not tailored for evaluation. UPPER CHEST: Included lung apices are clear. No superior mediastinal lymphadenopathy. CTA HEAD FINDINGS: ANTERIOR CIRCULATION: Patent cervical internal carotid arteries, petrous, cavernous and supra clinoid internal carotid arteries. Patent anterior communicating artery. Patent anterior and middle cerebral arteries. No large vessel occlusion, significant stenosis, contrast extravasation or aneurysm. POSTERIOR CIRCULATION: Patent  vertebral arteries, vertebrobasilar junction and basilar artery, as well as main branch vessels. Moderate calcific atherosclerosis RIGHT V4 segment resulting in mild stenosis. Luminal  irregularity LEFT V4 segment is unchanged, possible dissection or collateral vessels, moderate stenosis mid to distal LEFT V4 segment. Patent posterior cerebral arteries. Robust bilateral posterior communicating arteries. Mild stenosis RIGHT P1 segment. No large vessel occlusion, significant stenosis, contrast extravasation or aneurysm. VENOUS SINUSES: Major dural venous sinuses are patent though not tailored for evaluation on this angiographic examination. ANATOMIC VARIANTS: None. DELAYED PHASE: Not performed. MIP images reviewed. IMPRESSION: CTA neck: 1. No hemodynamically significant stenosis or acute vascular process. 2. 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 3. Similarly occluded proximal LEFT vertebral artery with reconstitution. 4. Moderate to severe canal stenosis C4-5. Severe neural foraminal narrowing C3-4 and C4-5. CTA head: 1. No emergent large vessel occlusion or flow-limiting stenosis. 2. Moderate stenosis LEFT V4 segment. Aortic Atherosclerosis (ICD10-I70.0). Critical Value/emergent results text paged to [place] via AMION secure system on 07/19/2017 at 3:37 pm, including interpreting physician's phone number. Electronically Signed   By: Elon Alas M.D.   On: 07/19/2017 15:49   Ct Angio Neck W Or Wo Contrast  Result Date: 07/19/2017 CLINICAL DATA:  Follow up code stroke. History of hypertension, carotid artery disease. EXAM: CT ANGIOGRAPHY HEAD AND NECK TECHNIQUE: Multidetector CT imaging of the head and neck was performed using the standard protocol during bolus administration of intravenous contrast. Multiplanar CT image reconstructions and MIPs were obtained to evaluate the vascular anatomy. Carotid stenosis measurements (when applicable) are obtained utilizing NASCET criteria, using the distal internal carotid diameter as the denominator. CONTRAST:  137mL ISOVUE-370 IOPAMIDOL (ISOVUE-370) INJECTION 76% COMPARISON:  CT HEAD July 19, 2017 and CT  angiogram head and neck Jul 03, 2016. FINDINGS: CTA NECK FINDINGS: AORTIC ARCH: Normal appearance of the thoracic arch, normal branch pattern. Mild calcific atherosclerosis aortic arch. The origins of the innominate, left Common carotid artery and subclavian artery are widely patent. RIGHT CAROTID SYSTEM: Common carotid artery is patent. Severe calcific atherosclerosis and intimal thickening with similarly ulcerated RIGHT internal carotid artery origin versus old dissection. No hemodynamically significant stenosis by NASCET criteria. Patent internal carotid artery. LEFT CAROTID SYSTEM: Common carotid artery is patent. Mild calcific atherosclerosis of the carotid bifurcation without hemodynamically significant stenosis by NASCET criteria. Normal appearance of the internal carotid artery. VERTEBRAL ARTERIES:RIGHT vertebral artery is dominant. Moderate stenosis LEFT vertebral artery origin. Similarly attenuated and intermittently occluded LEFT vertebral artery with tandem reconstitution via muscular cutaneous branches. SKELETON: No acute osseous process though bone windows have not been submitted. Moderate canal stenosis C3-4, moderate to severe canal stenosis C4-5, moderate at C6-7. Moderate LEFT C2-3, severe bilateral C3-4, severe bilateral C4-5 neural foraminal narrowing. OTHER NECK: Soft tissues of the neck are nonacute though, not tailored for evaluation. UPPER CHEST: Included lung apices are clear. No superior mediastinal lymphadenopathy. CTA HEAD FINDINGS: ANTERIOR CIRCULATION: Patent cervical internal carotid arteries, petrous, cavernous and supra clinoid internal carotid arteries. Patent anterior communicating artery. Patent anterior and middle cerebral arteries. No large vessel occlusion, significant stenosis, contrast extravasation or aneurysm. POSTERIOR CIRCULATION: Patent vertebral arteries, vertebrobasilar junction and basilar artery, as well as main branch vessels. Moderate calcific atherosclerosis RIGHT  V4 segment resulting in mild stenosis. Luminal irregularity LEFT V4 segment is unchanged, possible dissection or collateral vessels, moderate stenosis mid to distal LEFT V4 segment. Patent posterior cerebral arteries. Robust bilateral posterior communicating arteries. Mild stenosis RIGHT P1 segment. No large vessel occlusion, significant stenosis,  contrast extravasation or aneurysm. VENOUS SINUSES: Major dural venous sinuses are patent though not tailored for evaluation on this angiographic examination. ANATOMIC VARIANTS: None. DELAYED PHASE: Not performed. MIP images reviewed. IMPRESSION: CTA neck: 1. No hemodynamically significant stenosis or acute vascular process. 2. 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 3. Similarly occluded proximal LEFT vertebral artery with reconstitution. 4. Moderate to severe canal stenosis C4-5. Severe neural foraminal narrowing C3-4 and C4-5. CTA head: 1. No emergent large vessel occlusion or flow-limiting stenosis. 2. Moderate stenosis LEFT V4 segment. Aortic Atherosclerosis (ICD10-I70.0). Critical Value/emergent results text paged to [place] via AMION secure system on 07/19/2017 at 3:37 pm, including interpreting physician's phone number. Electronically Signed   By: Elon Alas M.D.   On: 07/19/2017 15:49   Ct Head Code Stroke Wo Contrast  Addendum Date: 07/19/2017   ADDENDUM REPORT: 07/19/2017 15:37 ADDENDUM: Critical Value/emergent results text paged to [place], Neurology via AMION secure system on 07/19/2017 at 3:32 pm, including interpreting physician's phone number. Electronically Signed   By: Elon Alas M.D.   On: 07/19/2017 15:37   Result Date: 07/19/2017 CLINICAL DATA:  Code stroke. Dizzy and nausea for an hour. History of stroke, hypertension, diabetes, carotid artery disease, pituitary tumor. EXAM: CT HEAD WITHOUT CONTRAST TECHNIQUE: Contiguous axial images were obtained from the base of the skull through the  vertex without intravenous contrast. COMPARISON:  CT HEAD Jul 03, 2016. FINDINGS: BRAIN: No intraparenchymal hemorrhage, mass effect nor midline shift. The ventricles and sulci are normal for age. Patchy to confluent supratentorial white matter hypodensities. Old LEFT cerebellar infarct. No acute large vascular territory infarcts. No abnormal extra-axial fluid collections. Basal cisterns are patent. VASCULAR: Moderate calcific atherosclerosis of the carotid siphons. No abnormally dense vessel. SKULL: No skull fracture. 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 No significant scalp soft tissue swelling. SINUSES/ORBITS: Partially imaged inspissated RIGHT maxillary sinusitis. Included ocular globes and orbital contents are non-suspicious. Status post bilateral ocular lens implants. OTHER: None. ASPECTS [facility] Stroke Program Early CT Score) - Ganglionic level infarction (caudate, lentiform nuclei, internal capsule, insula, M1-M3 cortex): 7 - Supraganglionic infarction (M4-M6 cortex): 3 Total score (0-10 with 10 being normal): 10 IMPRESSION: 1. No acute intracranial process. 2. ASPECTS is 10. 3. 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 Electronically Signed: By: Elon Alas M.D. On: 07/19/2017 15:34    Procedures Procedures (including critical care time)  Medications Ordered in ED Medications  iopamidol (ISOVUE-370) 76 % injection (has no administration in time range)  metoCLOPramide (REGLAN) injection 5 mg (5 mg Intravenous Given 07/19/17 1454)  ondansetron (ZOFRAN) injection 4 mg (4 mg Intravenous Given 07/19/17 1511)  iopamidol (ISOVUE-370) 76 % injection 50 mL (100 mLs Intravenous Contrast Given 07/19/17 1536)     Initial Impression / Assessment and Plan / ED Course  I have reviewed the triage vital signs and the nursing notes.  Pertinent labs & imaging results that were available during my care of the patient were reviewed  by me and considered in my medical decision making (see chart for details).     77 year old with history of strokes, and multiple medical comorbidities comes in with chief complaint of nausea and dizziness.  Patient was last normal at 1:30 PM, and our clinical concerns are high for stroke.  Code stroke has been activated. NIH SS = 0. Not a stroke candidate  -on eliquis.   Final Clinical Impressions(s) / ED Diagnoses   Final diagnoses:  Cerebellar  stroke, acute [facility])    ED Discharge Orders    None       Varney Biles, MD 07/19/17 [phone]

## 2017-07-19 NOTE — ED Notes (Signed)
Code Stroke activated via [facility] [place] w/ [facility].

## 2017-07-20 ENCOUNTER — Observation Stay (HOSPITAL_BASED_OUTPATIENT_CLINIC_OR_DEPARTMENT_OTHER): Payer: Medicare Other

## 2017-07-20 DIAGNOSIS — I340 Nonrheumatic mitral (valve) insufficiency: Secondary | ICD-10-CM | POA: Diagnosis not present

## 2017-07-20 DIAGNOSIS — R079 Chest pain, unspecified: Secondary | ICD-10-CM | POA: Diagnosis not present

## 2017-07-20 DIAGNOSIS — I639 Cerebral infarction, unspecified: Secondary | ICD-10-CM | POA: Diagnosis not present

## 2017-07-20 DIAGNOSIS — I5032 Chronic diastolic (congestive) heart failure: Secondary | ICD-10-CM | POA: Diagnosis not present

## 2017-07-20 DIAGNOSIS — R42 Dizziness and giddiness: Secondary | ICD-10-CM | POA: Diagnosis not present

## 2017-07-20 LAB — LIPID PANEL
Cholesterol: 134 mg/dL (ref 0–200)
HDL: 33 mg/dL — ABNORMAL LOW (ref >40)
LDL CALC: 68 mg/dL (ref 0–99)
Total CHOL/HDL Ratio: 4.1 RATIO
Triglycerides: 166 mg/dL — ABNORMAL HIGH (ref <150)
VLDL: 33 mg/dL (ref 0–40)

## 2017-07-20 LAB — GLUCOSE, CAPILLARY
GLUCOSE-CAPILLARY: 101 mg/dL — AB (ref 65–99)
Glucose-Capillary: 90 mg/dL (ref 65–99)
Glucose-Capillary: 94 mg/dL (ref 65–99)
Glucose-Capillary: 125 mg/dL — ABNORMAL HIGH (ref 65–99)

## 2017-07-20 LAB — CBC
HCT: 34.1 % — ABNORMAL LOW (ref 39.0–52.0)
HEMOGLOBIN: 11.2 g/dL — AB (ref 13.0–17.0)
MCH: 32.2 pg (ref 26.0–34.0)
MCHC: 32.8 g/dL (ref 30.0–36.0)
MCV: 98 fL (ref 78.0–100.0)
Platelets: 139 10*3/uL — ABNORMAL LOW (ref 150–400)
RBC: 3.48 MIL/uL — AB (ref 4.22–5.81)
RDW: 14.3 % (ref 11.5–15.5)
WBC: 7.2 10*3/uL (ref 4.0–10.5)

## 2017-07-20 LAB — BASIC METABOLIC PANEL
Anion gap: 6 (ref 5–15)
BUN: 11 mg/dL (ref 6–20)
CALCIUM: 8.5 mg/dL — AB (ref 8.9–10.3)
CO2: 27 mmol/L (ref 22–32)
Chloride: 107 mmol/L (ref 101–111)
Creatinine, Ser: 1.55 mg/dL — ABNORMAL HIGH (ref 0.61–1.24)
GFR, EST AFRICAN AMERICAN: 47 mL/min — AB (ref >60)
GFR, EST NON AFRICAN AMERICAN: 41 mL/min — AB (ref >60)
GLUCOSE: 88 mg/dL (ref 65–99)
Potassium: 4 mmol/L (ref 3.5–5.1)
SODIUM: 140 mmol/L (ref 135–145)

## 2017-07-20 LAB — ECHOCARDIOGRAM COMPLETE
Height: 67 in
Weight: 2825.42 oz

## 2017-07-20 LAB — HEMOGLOBIN A1C
HEMOGLOBIN A1C: 6.1 % — AB (ref 4.8–5.6)
MEAN PLASMA GLUCOSE: 128.37 mg/dL

## 2017-07-20 MED ORDER — ROSUVASTATIN CALCIUM 5 MG PO TABS
5.0000 mg | ORAL_TABLET | Freq: Every day | ORAL | Status: DC
  Administered 2017-07-20 – 2017-07-22 (×3): 5 mg via ORAL
  Filled 2017-07-20 (×3): qty 1

## 2017-07-20 NOTE — Progress Notes (Signed)
  Echocardiogram 2D Echocardiogram has been performed.  Ovide Dusek L Androw 07/20/2017, 10:06 AM

## 2017-07-20 NOTE — Evaluation (Signed)
Physical Therapy Evaluation Patient Details Name: [name] MRN: [id number] DOB: 1940/02/26 Today's Date: 07/20/2017   History of Present Illness  77 yo male admitted with POV MRI (-) CT (-) for cva workup. Pt positive for dizziness. PMH: CAD Cerebellar CVA HTN DM (multiple admissions in 07/2017)   Clinical Impression  Orders received for PT evaluation. Patient demonstrates deficits in functional mobility as indicated below. Will benefit from continued skilled PT to address deficits and maximize function. Will see as indicated and progress as tolerated.   Patient presenting with odd gait disturbances (freezing upon any alterations such as simple directional cues. Patient also with significant posterior LOB and retropulsion during transfers. Full body tremors during mobility). Unable to self correct. Extremely high fall risk at this time which is concerning given anticoagulation.  MD made aware of concerns regarding patient safety with mobility. May need to reassess next session for possible rehabilitation and symptom management.    Follow Up Recommendations Home health PT;Supervision/Assistance - 24 hour(vs Rehabilitation if symptoms dont improve)    Equipment Recommendations  (TBD)    Recommendations for Other Services       Precautions / Restrictions Precautions Precautions: Fall      Mobility  Bed Mobility Overal bed mobility: Needs Assistance Bed Mobility: Rolling;Supine to Sit;Sit to Supine Rolling: Supervision   Supine to sit: Min assist Sit to supine: Min assist   General bed mobility comments: pt requires (A) to elevate trunk from bed surface. pt dizziness upon sitting. dizzines dues not resolve and no nystagmu noted  Transfers Overall transfer level: Needs assistance Equipment used: Rolling walker (2 wheeled) Transfers: Sit to/from Stand Sit to Stand: Min assist         General transfer comment: Patient with heavy posterior list when attempting to rise to  standing, required physical assist to perform transfer with multiple attempts to initiate power up  Ambulation/Gait Ambulation/Gait assistance: Min assist;Max assist(9 episodes of complete LOB, inability to correct) Ambulation Distance (Feet): 120 Feet(split ambulation with hands on physical assist throughout) Assistive device: 1 person hand held assist Gait Pattern/deviations: Ataxic;Narrow base of support;Staggering right;Staggering left;Leaning posteriorly     General Gait Details: patient with extremely   Stairs            Wheelchair Mobility    Modified Rankin (Stroke Patients Only)       Balance Overall balance assessment: Needs assistance Sitting-balance support: Bilateral upper extremity supported;Feet supported Sitting balance-Leahy Scale: Fair     Standing balance support: Bilateral upper extremity supported;During functional activity Standing balance-Leahy Scale: Poor Standing balance comment: pt relies on alternative assist for all aspects of standing.                             Pertinent Vitals/Pain Pain Assessment: No/denies pain    Home Living Family/patient expects to be discharged to:: Private residence Living Arrangements: Spouse/significant other Available Help at Discharge: Family Type of Home: House Home Access: Stairs to enter Entrance Stairs-Rails: Psychiatric nurse of Steps: 3 Home Layout: Two level;Bed/bath upstairs Home Equipment: Shower seat - built in      Prior Function Level of Independence: Independent               Hand Dominance   Dominant Hand: Right    Extremity/Trunk Assessment        Lower Extremity Assessment Lower Extremity Assessment: RLE deficits/detail;LLE deficits/detail RLE Coordination: decreased fine motor;decreased gross motor LLE Coordination: decreased  fine motor;decreased gross motor    Cervical / Trunk Assessment Cervical / Trunk Assessment: Kyphotic   Communication   Communication: Expressive difficulties  Cognition Arousal/Alertness: Awake/alert Behavior During Therapy: WFL for tasks assessed/performed Overall Cognitive Status: Within Functional Limits for tasks assessed                                        General Comments      Exercises     Assessment/Plan    PT Assessment Patient needs continued PT services  PT Problem List Decreased activity tolerance;Decreased balance;Decreased mobility;Decreased coordination       PT Treatment Interventions Gait training;DME instruction;Functional mobility training;Therapeutic activities;Therapeutic exercise;Neuromuscular re-education;Balance training;Patient/family education    PT Goals (Current goals can be found in the Care Plan section)  Acute Rehab PT Goals Patient Stated Goal: to go home PT Goal Formulation: With patient/family Time For Goal Achievement: 08/03/17 Potential to Achieve Goals: Good    Frequency Min 4X/week   Barriers to discharge        Co-evaluation               AM-PAC PT "6 Clicks" Daily Activity  Outcome Measure Difficulty turning over in bed (including adjusting bedclothes, sheets and blankets)?: A Little Difficulty moving from lying on back to sitting on the side of the bed? : A Little Difficulty sitting down on and standing up from a chair with arms (e.g., wheelchair, bedside commode, etc,.)?: Unable Help needed moving to and from a bed to chair (including a wheelchair)?: A Lot Help needed walking in hospital room?: A Lot Help needed climbing 3-5 steps with a railing? : A Lot 6 Click Score: 13    End of Session Equipment Utilized During Treatment: Gait belt Activity Tolerance: Patient tolerated treatment well Patient left: in bed;with call bell/phone within reach;with bed alarm set;with family/visitor present Nurse Communication: Mobility status PT Visit Diagnosis: Difficulty in walking, not elsewhere classified  (R26.2)    Time: 5916-3846 PT Time Calculation (min) (ACUTE ONLY): 21 min   Charges:   PT Evaluation $PT Eval Moderate Complexity: 1 Mod     PT G Codes:        Alben Deeds, PT DPT  Board Certified Neurologic Specialist [place] 07/20/2017, 4:35 PM

## 2017-07-20 NOTE — Progress Notes (Addendum)
STROKE TEAM PROGRESS NOTE  HPI ( Dr Rory Percy): [name] is an 77 y.o. 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  Patient is on Eliquis.  He took an Eliquis pill this morning.  Patient apparently was having lunch today, was at his baseline, at approximately 130pm patient suddenly became diaphoretic, staggering and had nausea vomiting.  Wife drove him to [facility] apparently he was significantly nauseous and vomiting during the way here.  On arrival patient was brought straight to CT scan which did not show any acute stroke but did show the old left cerebellar stroke.  As he took his Eliquis today we were unable to give TPA so a CTA was obtained and did show old intracranial vascular abnormalities but nothing new.  For this reason MRI will be obtained to evaluate for possible new stroke.  As he had no large vessel occlusion he was not a intra-arterial candidate.  Date last known well: Date: 07/19/2017 Time last known well: Time: 13:00 tPA Given: No: Mild symptoms NIHSS 0 Modified Rankin: Rankin Score=0   INTERVAL HISTORY His wife is at the bedside.  He recounted nausea and vomiting yesterday on the way to the hospital and going to the MRI, reminiscent of symptoms at time of previous stroke. He denied significant headache, loss of vision, diplopia vertigo or focal extremity weakness and numbness.  Vitals:   07/19/17 2245 07/20/17 0036 07/20/17 0455 07/20/17 0826  BP: (!) 149/71 110/66 125/60 (!) 142/75  Pulse: 70 70 75 73  Resp: 18 18 18 20   Temp: 97.9 F (36.6 C) 97.9 F (36.6 C) 98 F (36.7 C) 97.9 F (36.6 C)  TempSrc: Oral Oral Oral Oral  SpO2: 98% 98% 97% 100%  Weight:   80.1 kg (176 lb 9.4 oz)   Height:        CBC:  Recent Labs  Lab 07/19/17 1457 07/19/17 1522 07/20/17 0525  WBC 9.1  --  7.2  NEUTROABS 6.4  --   --   HGB 12.5* 12.2* 11.2*  HCT 37.3* 36.0* 34.1*  MCV 98.2  --  98.0  PLT 188  --  139*    Basic Metabolic Panel:  Recent  Labs  Lab 07/19/17 1457 07/19/17 1522 07/20/17 0525  NA 140 139 140  K 4.0 4.2 4.0  CL 106 105 107  CO2 24  --  27  GLUCOSE 131* 129* 88  BUN 12 17 11   CREATININE 1.63* 1.60* 1.55*  CALCIUM 9.3  --  8.5*   Lipid Panel:     Component Value Date/Time   CHOL 134 07/20/2017 0525   TRIG 166 (H) 07/20/2017 0525   HDL 33 (L) 07/20/2017 0525   CHOLHDL 4.1 07/20/2017 0525   VLDL 33 07/20/2017 0525   LDLCALC 68 07/20/2017 0525   HgbA1c:  Lab Results  Component Value Date   HGBA1C 6.1 (H) 07/20/2017   Urine Drug Screen:     Component Value Date/Time   LABOPIA NONE DETECTED 07/19/2017 1734   COCAINSCRNUR NONE DETECTED 07/19/2017 1734   LABBENZ NONE DETECTED 07/19/2017 1734   AMPHETMU NONE DETECTED 07/19/2017 1734   THCU NONE DETECTED 07/19/2017 1734   LABBARB NONE DETECTED 07/19/2017 1734    Alcohol Level No results found for: ETH  IMAGING Ct Angio Head W Or Wo Contrast  Result Date: 07/19/2017 CLINICAL DATA:  Follow up code stroke. History of hypertension, carotid artery disease. EXAM: CT ANGIOGRAPHY HEAD AND NECK TECHNIQUE: Multidetector CT imaging of  the head and neck was performed using the standard protocol during bolus administration of intravenous contrast. Multiplanar CT image reconstructions and MIPs were obtained to evaluate the vascular anatomy. Carotid stenosis measurements (when applicable) are obtained utilizing NASCET criteria, using the distal internal carotid diameter as the denominator. CONTRAST:  155mL ISOVUE-370 IOPAMIDOL (ISOVUE-370) INJECTION 76% COMPARISON:  CT HEAD July 19, 2017 and CT angiogram head and neck Jul 03, 2016. FINDINGS: CTA NECK FINDINGS: AORTIC ARCH: Normal appearance of the thoracic arch, normal branch pattern. Mild calcific atherosclerosis aortic arch. The origins of the innominate, left Common carotid artery and subclavian artery are widely patent. RIGHT CAROTID SYSTEM: Common carotid artery is patent. Severe calcific atherosclerosis and  intimal thickening with similarly ulcerated RIGHT internal carotid artery origin versus old dissection. No hemodynamically significant stenosis by NASCET criteria. Patent internal carotid artery. LEFT CAROTID SYSTEM: Common carotid artery is patent. Mild calcific atherosclerosis of the carotid bifurcation without hemodynamically significant stenosis by NASCET criteria. Normal appearance of the internal carotid artery. VERTEBRAL ARTERIES:RIGHT vertebral artery is dominant. Moderate stenosis LEFT vertebral artery origin. Similarly attenuated and intermittently occluded LEFT vertebral artery with tandem reconstitution via muscular cutaneous branches. SKELETON: No acute osseous process though bone windows have not been submitted. Moderate canal stenosis C3-4, moderate to severe canal stenosis C4-5, moderate at C6-7. Moderate LEFT C2-3, severe bilateral C3-4, severe bilateral C4-5 neural foraminal narrowing. OTHER NECK: Soft tissues of the neck are nonacute though, not tailored for evaluation. UPPER CHEST: Included lung apices are clear. No superior mediastinal lymphadenopathy. CTA HEAD FINDINGS: ANTERIOR CIRCULATION: Patent cervical internal carotid arteries, petrous, cavernous and supra clinoid internal carotid arteries. Patent anterior communicating artery. Patent anterior and middle cerebral arteries. No large vessel occlusion, significant stenosis, contrast extravasation or aneurysm. POSTERIOR CIRCULATION: Patent vertebral arteries, vertebrobasilar junction and basilar artery, as well as main branch vessels. Moderate calcific atherosclerosis RIGHT V4 segment resulting in mild stenosis. Luminal irregularity LEFT V4 segment is unchanged, possible dissection or collateral vessels, moderate stenosis mid to distal LEFT V4 segment. Patent posterior cerebral arteries. Robust bilateral posterior communicating arteries. Mild stenosis RIGHT P1 segment. No large vessel occlusion, significant stenosis, contrast extravasation or  aneurysm. VENOUS SINUSES: Major dural venous sinuses are patent though not tailored for evaluation on this angiographic examination. ANATOMIC VARIANTS: None. DELAYED PHASE: Not performed. MIP images reviewed. IMPRESSION: CTA neck: 1. No hemodynamically significant stenosis or acute vascular process. 2. 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 3. Similarly occluded proximal LEFT vertebral artery with reconstitution. 4. Moderate to severe canal stenosis C4-5. Severe neural foraminal narrowing C3-4 and C4-5. CTA head: 1. No emergent large vessel occlusion or flow-limiting stenosis. 2. Moderate stenosis LEFT V4 segment. Aortic Atherosclerosis (ICD10-I70.0). Critical Value/emergent results text paged to [place] via AMION secure system on 07/19/2017 at 3:37 pm, including interpreting physician's phone number. Electronically Signed   By: Elon Alas M.D.   On: 07/19/2017 15:49   Ct Angio Neck W Or Wo Contrast  Result Date: 07/19/2017 CLINICAL DATA:  Follow up code stroke. History of hypertension, carotid artery disease. EXAM: CT ANGIOGRAPHY HEAD AND NECK TECHNIQUE: Multidetector CT imaging of the head and neck was performed using the standard protocol during bolus administration of intravenous contrast. Multiplanar CT image reconstructions and MIPs were obtained to evaluate the vascular anatomy. Carotid stenosis measurements (when applicable) are obtained utilizing NASCET criteria, using the distal internal carotid diameter as the denominator. CONTRAST:  114mL ISOVUE-370 IOPAMIDOL (ISOVUE-370) INJECTION 76% COMPARISON:  CT HEAD July 19, 2017 and  CT angiogram head and neck Jul 03, 2016. FINDINGS: CTA NECK FINDINGS: AORTIC ARCH: Normal appearance of the thoracic arch, normal branch pattern. Mild calcific atherosclerosis aortic arch. The origins of the innominate, left Common carotid artery and subclavian artery are widely patent. RIGHT CAROTID SYSTEM: Common carotid artery  is patent. Severe calcific atherosclerosis and intimal thickening with similarly ulcerated RIGHT internal carotid artery origin versus old dissection. No hemodynamically significant stenosis by NASCET criteria. Patent internal carotid artery. LEFT CAROTID SYSTEM: Common carotid artery is patent. Mild calcific atherosclerosis of the carotid bifurcation without hemodynamically significant stenosis by NASCET criteria. Normal appearance of the internal carotid artery. VERTEBRAL ARTERIES:RIGHT vertebral artery is dominant. Moderate stenosis LEFT vertebral artery origin. Similarly attenuated and intermittently occluded LEFT vertebral artery with tandem reconstitution via muscular cutaneous branches. SKELETON: No acute osseous process though bone windows have not been submitted. Moderate canal stenosis C3-4, moderate to severe canal stenosis C4-5, moderate at C6-7. Moderate LEFT C2-3, severe bilateral C3-4, severe bilateral C4-5 neural foraminal narrowing. OTHER NECK: Soft tissues of the neck are nonacute though, not tailored for evaluation. UPPER CHEST: Included lung apices are clear. No superior mediastinal lymphadenopathy. CTA HEAD FINDINGS: ANTERIOR CIRCULATION: Patent cervical internal carotid arteries, petrous, cavernous and supra clinoid internal carotid arteries. Patent anterior communicating artery. Patent anterior and middle cerebral arteries. No large vessel occlusion, significant stenosis, contrast extravasation or aneurysm. POSTERIOR CIRCULATION: Patent vertebral arteries, vertebrobasilar junction and basilar artery, as well as main branch vessels. Moderate calcific atherosclerosis RIGHT V4 segment resulting in mild stenosis. Luminal irregularity LEFT V4 segment is unchanged, possible dissection or collateral vessels, moderate stenosis mid to distal LEFT V4 segment. Patent posterior cerebral arteries. Robust bilateral posterior communicating arteries. Mild stenosis RIGHT P1 segment. No large vessel occlusion,  significant stenosis, contrast extravasation or aneurysm. VENOUS SINUSES: Major dural venous sinuses are patent though not tailored for evaluation on this angiographic examination. ANATOMIC VARIANTS: None. DELAYED PHASE: Not performed. MIP images reviewed. IMPRESSION: CTA neck: 1. No hemodynamically significant stenosis or acute vascular process. 2. 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 3. Similarly occluded proximal LEFT vertebral artery with reconstitution. 4. Moderate to severe canal stenosis C4-5. Severe neural foraminal narrowing C3-4 and C4-5. CTA head: 1. No emergent large vessel occlusion or flow-limiting stenosis. 2. Moderate stenosis LEFT V4 segment. Aortic Atherosclerosis (ICD10-I70.0). Critical Value/emergent results text paged to [place] via AMION secure system on 07/19/2017 at 3:37 pm, including interpreting physician's phone number. Electronically Signed   By: Elon Alas M.D.   On: 07/19/2017 15:49   Mr Brain Wo Contrast  Result Date: 07/20/2017 CLINICAL DATA:  Initial evaluation for sudden onset dizziness, unsteady gait. EXAM: MRI HEAD WITHOUT CONTRAST TECHNIQUE: Multiplanar, multiecho pulse sequences of the brain and surrounding structures were obtained without intravenous contrast. COMPARISON:  Prior CTA from earlier the same day. FINDINGS: Brain: Generalized age-related cerebral atrophy. Patchy and confluent T2/FLAIR hyperintensity within the periventricular deep white matter both cerebral hemispheres, moderate to advanced in nature. Remote left cerebellar infarct with trace chronic hemosiderin staining. No abnormal foci of restricted diffusion to suggest acute or subacute ischemia. Gray-white matter differentiation maintained. No other areas of remote cortical infarction. No acute or chronic intracranial hemorrhage. No mass lesion, midline shift or mass effect. No hydrocephalus. No extra-axial fluid collection. Major dural sinuses grossly  patent. Pituitary gland within normal limits. Vascular: Major intracranial vascular flow voids maintained. Left vertebral artery hypoplastic and not well visualized. Skull and upper cervical spine: Craniocervical junction within normal limits. Degenerative  thickening at the tectorial membrane without significant stenosis. Moderate cervical spondylolysis at C3-4 and C4-5 with resultant mild to moderate spinal stenosis. Bone marrow signal intensity within normal limits. No focal marrow replacing lesion. Scalp soft tissues within normal limits. Sinuses/Orbits: Globes and orbital soft tissues within normal limits. Patient status post lens extraction bilaterally. Moderate mucosal thickening within the right maxillary sinus. Mild mucosal thickening within the right sphenoid sinus. Paranasal sinuses are otherwise largely clear. Small bilateral mastoid effusions, likely sterile/benign. Inner ear structures grossly normal. Other: None. IMPRESSION: 1. No acute intracranial abnormality. 2. 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 Electronically Signed   By: Jeannine Boga M.D.   On: 07/20/2017 00:01   Ct Head Code Stroke Wo Contrast  Addendum Date: 07/19/2017   ADDENDUM REPORT: 07/19/2017 15:37 ADDENDUM: Critical Value/emergent results text paged to [place], Neurology via AMION secure system on 07/19/2017 at 3:32 pm, including interpreting physician's phone number. Electronically Signed   By: Elon Alas M.D.   On: 07/19/2017 15:37   Result Date: 07/19/2017 CLINICAL DATA:  Code stroke. Dizzy and nausea for an hour. History of stroke, hypertension, diabetes, carotid artery disease, pituitary tumor. EXAM: CT HEAD WITHOUT CONTRAST TECHNIQUE: Contiguous axial images were obtained from the base of the skull through the vertex without intravenous contrast. COMPARISON:  CT HEAD Jul 03, 2016. FINDINGS: BRAIN: No intraparenchymal hemorrhage, mass effect nor  midline shift. The ventricles and sulci are normal for age. Patchy to confluent supratentorial white matter hypodensities. Old LEFT cerebellar infarct. No acute large vascular territory infarcts. No abnormal extra-axial fluid collections. Basal cisterns are patent. VASCULAR: Moderate calcific atherosclerosis of the carotid siphons. No abnormally dense vessel. SKULL: No skull fracture. 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 No significant scalp soft tissue swelling. SINUSES/ORBITS: Partially imaged inspissated RIGHT maxillary sinusitis. Included ocular globes and orbital contents are non-suspicious. Status post bilateral ocular lens implants. OTHER: None. ASPECTS [facility] Stroke Program Early CT Score) - Ganglionic level infarction (caudate, lentiform nuclei, internal capsule, insula, M1-M3 cortex): 7 - Supraganglionic infarction (M4-M6 cortex): 3 Total score (0-10 with 10 being normal): 10 IMPRESSION: 1. No acute intracranial process. 2. ASPECTS is 10. 3. 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 Electronically Signed: By: Elon Alas M.D. On: 07/19/2017 15:34   2D Echocardiogram  - Left ventricle: The cavity size was normal. There was mild concentric hypertrophy. Systolic function was normal. The estimated ejection fraction was in the range of 50% to 55%. Wall motion was normal; there were no regional wall motion abnormalities. Doppler parameters are consistent with abnormal left ventricular relaxation (grade 1 diastolic dysfunction). Doppler parameters are consistent with elevated ventricular end-diastolic filling pressure. - Aortic valve: There was mild stenosis. There was trivial regurgitation. Valve area (VTI): 2.08 cm^2. Valve area (Vmax): 1.86 cm^2. Valve area (Vmean): 1.81 cm^2. - Mitral valve: There was mild regurgitation. - Right ventricle: The cavity size was normal. Wall thickness was normal. Systolic function was  normal. - Right atrium: The atrium was normal in size. - Pulmonary arteries: Systolic pressure was within the normal range. - Inferior vena cava: The vessel was normal in size. - Pericardium, extracardiac: There was no pericardial effusion. Impressions:   No cardiac source of emboli was indentified.   PHYSICAL EXAM  Pleasant elderly Caucasian male currently not in distress. HEENT-  Normocephalic, no lesions, without obvious abnormality.  Normal external eye and conjunctiva.   Musculoskeletal-no joint tenderness, deformity or  swelling Skin-warm and dry  Neurological Examination Mental Status: Alert, oriented, thought content appropriate.  Speech fluent without evidence of aphasia.  Able to follow 3 step commands without difficulty. Cranial Nerves: II:  Visual fields grossly normal,  III,IV, VI: ptosis not present, extra-ocular motions intact bilaterally, pupils equal, round, reactive to light and accommodation- no nystagmus and full range of eye movements. Does complain of subjective dizziness when he sits up or moves suddenly. V,VII: smile symmetric--patient does not have any facial droop when smiling, facial light touch sensation normal bilaterally VIII: hearing normal bilaterally IX,X: uvula rises symmetrically XI: bilateral shoulder shrug XII: midline tongue extension Motor: Right :  Upper extremity   5/5                                      Left:     Upper extremity   5/5             Lower extremity   5/5                                                  Lower extremity   5/5 Tone and bulk:normal tone throughout; no atrophy noted Sensory: Pinprick and light touch intact throughout, bilaterally Deep Tendon Reflexes: 2+ and symmetric throughout Plantars: Right: downgoing                                Left: downgoing recrudescence of previous Cerebellar: normal finger-to-nose --significant postural tremor bilaterally in the arms.  Mild dysmetria on the right however this may be  due to his tremor. Gait: Not tested   ASSESSMENT/PLAN [name] is a 77 y.o. 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   Posterior circulationTIA / recrudescence of previous stroke symptoms  Resultant  Nausea, vomiting and dizziness   Code Stroke CT head No acute stroke.  Moderate to severe small vessel disease.  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  ASPECTS 10.     CTA head no ELVO.  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  Moderate to severe canal stenosis C4-5 with severe neuroforaminal narrowing C3-4 and C4-5  MRI  No acute stroke.  Old left cerebellar infarct.  Moderate to advanced small vessel disease.  2D Echo  EF 50-55%. No source of embolus   LDL 68  HgbA1c 6.1  Eliquis for VTE prophylaxis  Eliquis (apixaban) daily prior to admission, now on Eliquis (apixaban) daily.  Continue Eliquis at discharge  Therapy recommendations:  OP OT  Disposition:  Return home with wife  Ongoing symptomatic control.  Expect it will improve.  Hx DVT, PE  On chronic anticoagulation.   Was on coumadin until stroke in 2017 when he was changed to Eliquis  Hypertension  Stable . BP goal normotensive  Hyperlipidemia  Home meds:  crestor 10, changed to lipitor 80 on admission to hospital  LDL 68, goal < 70  Continue Crestor 10 mg, Continue at discharge. Orders adjusted  Other Stroke Risk Factors  Advanced age  Hx stroke/TIA  06/2015 - left PICA and cerebellar vermis infarcts embolic pattern, secondary  to unknown source. Etiology could be cardioembolic as pt has MAT on exam with potential afib vs. 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 Agree with note above.  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 Continue eliquis for long-term anticoagulation given history of pulmonary embolism and DVT. Maintain adequate hydration and avoid hypotension. Long discussion with the patient and wife at the bedside and answered questions. Discussed with Dr. Florene Glen. Greater than 50% and in this 35 minute visit was spent on counseling and coordination of care about his TIA and answered questions  Antony Contras, [place] Pager: [phone] 07/20/2017 2:47 PM  To contact Stroke Continuity provider, please refer to [email or web address] After hours, contact General Neurology

## 2017-07-20 NOTE — Evaluation (Signed)
Occupational Therapy Evaluation Patient Details Name: [name] MRN: [id number] DOB: Oct 18, 1940 Today's Date: 07/20/2017    History of Present Illness 77 yo male admitted with POV MRI (-) CT (-) for cva workup. Pt positive for dizziness. PMH: CAD Cerebellar CVA HTN DM (multiple admissions in 07/2017)    Clinical Impression   PT admitted with POV and dizziness. Pt currently with functional limitiations due to the deficits listed below (see OT problem list). PT currently requires min (A) with transfer RW and demonstrates LOB x2 during session with max (A) to (A). Pt is unsafe to d/c home at this time with balance deficits. Pt with new full body tremor and a pause with gait with transfers. Pt requires gaze stabilization to help with transfers and decr fall risk. Pt reports peripheral vision movement and even moving in the hall with RW causes him to feel unsteady at this time. Pt reports "I have to close my eyes when they took me to MRI because that stuff moving beside me was making me so sick"  Pt will benefit from skilled OT to increase their independence and safety with adls and balance to allow discharge outpatient balance .     Follow Up Recommendations  Outpatient OT    Equipment Recommendations  Other (comment)(RW)    Recommendations for Other Services PT consult     Precautions / Restrictions Precautions Precautions: Fall      Mobility Bed Mobility Overal bed mobility: Needs Assistance Bed Mobility: Rolling;Supine to Sit;Sit to Supine Rolling: Supervision   Supine to sit: Min assist Sit to supine: Min assist   General bed mobility comments: pt requires (A) to elevate trunk from bed surface. pt dizziness upon sitting. dizzines dues not resolve and no nystagmu noted  Transfers Overall transfer level: Needs assistance Equipment used: Rolling walker (2 wheeled) Transfers: Sit to/from Stand Sit to Stand: Min assist         General transfer comment: pt with bil ue  extended reaching for environmental supports. pt provided RW after 4 steps to the sink level and reports "yes that feels better."     Balance Overall balance assessment: Needs assistance Sitting-balance support: Bilateral upper extremity supported;Feet supported Sitting balance-Leahy Scale: Fair     Standing balance support: Bilateral upper extremity supported;During functional activity Standing balance-Leahy Scale: Poor Standing balance comment: pt relies on RW                           ADL either performed or assessed with clinical judgement   ADL Overall ADL's : Needs assistance/impaired     Grooming: Minimal assistance;Standing Grooming Details (indicate cue type and reason): pt with tremor noted to entire body with functional movement and at rest     Lower Body Bathing: Moderate assistance;Sit to/from stand           Toilet Transfer: Minimal assistance           Functional mobility during ADLs: Minimal assistance;Rolling walker General ADL Comments: pt requires RW with all movement. pt normally ambulates without DME. pt move with very rigid movement and unable to progress. pt able to take 4-5 steps then has a pause with inability to advance then able to initiate again     Vision Baseline Vision/History: Glaucoma Additional Comments: pt denies all diplopia no nystagmus noted throughout session     Perception     Praxis      Pertinent Vitals/Pain Pain Assessment: No/denies pain(c/o dizziness  constant with movement)     Hand Dominance Right   Extremity/Trunk Assessment Upper Extremity Assessment Upper Extremity Assessment: RUE deficits/detail;LUE deficits/detail RUE Deficits / Details: MMT 3+ out 5 shoulder  LUE Deficits / Details: MMT 3+ out 5 shoulder flexion   Lower Extremity Assessment Lower Extremity Assessment: Defer to PT evaluation   Cervical / Trunk Assessment Cervical / Trunk Assessment: Kyphotic   Communication  Communication Communication: Expressive difficulties   Cognition Arousal/Alertness: Awake/alert Behavior During Therapy: WFL for tasks assessed/performed Overall Cognitive Status: Within Functional Limits for tasks assessed                                     General Comments  pt with pause with transfers and stopping gait completely unable to advance. pt appears to lose the sequence to initiate the task.     Exercises     Shoulder Instructions      Home Living Family/patient expects to be discharged to:: Private residence Living Arrangements: Spouse/significant other Available Help at Discharge: Family Type of Home: House Home Access: Stairs to enter Technical brewer of Steps: 3 Entrance Stairs-Rails: Right;Left Home Layout: Two level;Bed/bath upstairs     Bathroom Shower/Tub: Occupational psychologist: Standard Bathroom Accessibility: Yes   Home Equipment: Shower seat - built in          Prior Functioning/Environment Level of Independence: Independent                 OT Problem List: Decreased range of motion;Decreased strength;Decreased activity tolerance;Impaired balance (sitting and/or standing);Decreased coordination;Decreased safety awareness;Decreased knowledge of use of DME or AE;Decreased knowledge of precautions;Obesity;Impaired UE functional use      OT Treatment/Interventions: Self-care/ADL training;Therapeutic exercise;Therapeutic activities;Neuromuscular education;DME and/or AE instruction;Patient/family education;Balance training    OT Goals(Current goals can be found in the care plan section) Acute Rehab OT Goals Patient Stated Goal: to go home OT Goal Formulation: With patient/family Time For Goal Achievement: 08/03/17 Potential to Achieve Goals: Good  OT Frequency: Min 3X/week   Barriers to D/C: Other (comment)(wife to (A) at supervision level )          Co-evaluation              AM-PAC PT "6  Clicks" Daily Activity     Outcome Measure Help from another person eating meals?: A Little Help from another person taking care of personal grooming?: A Little Help from another person toileting, which includes using toliet, bedpan, or urinal?: A Little Help from another person bathing (including washing, rinsing, drying)?: A Lot Help from another person to put on and taking off regular upper body clothing?: A Little Help from another person to put on and taking off regular lower body clothing?: A Lot 6 Click Score: 16   End of Session Equipment Utilized During Treatment: Gait belt;Rolling walker Nurse Communication: Mobility status;Precautions  Activity Tolerance: Patient tolerated treatment well Patient left: in bed;with call bell/phone within reach;with family/visitor present  OT Visit Diagnosis: Unsteadiness on feet (R26.81);Muscle weakness (generalized) (M62.81)                Time: 1020-1046 OT Time Calculation (min): 26 min Charges:  OT General Charges $OT Visit: 1 Visit OT Evaluation $OT Eval Moderate Complexity: 1 Mod G-Codes:      Jeri Modena   OTR/L Pager: [phone] Office: [phone] .   [name] 07/20/2017, 12:16 PM

## 2017-07-20 NOTE — Care Management Obs Status (Signed)
[facility] NOTIFICATION   Patient Details  Name: [name] MRN: [id number] Date of Birth: August 03, 1940   Medicare Observation Status Notification Given:  Yes    Pollie Friar, RN 07/20/2017, 4:24 PM

## 2017-07-20 NOTE — Progress Notes (Signed)
PROGRESS NOTE    [name]  TIR:[id number] DOB: 1940/05/12 DOA: 07/19/2017 PCP: Renato Shin, MD   Brief Narrative:  [name] is [name] 77 y.o. male with history of CAD, multiple prior cerebellar CVA, on Eliquis, hypertension, hyperlipidemia, diabetes presented with sudden onset of dizziness, nausea, vomiting, unsteady while eating lunch today.  Patient's wife drove him to [facility] from [place].  CT head negative for acute  CVA.  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  Symptoms may be exacerbated due to history of ocular myasthenia.   Symptoms mostly improved Not TPA candidate patient is on Eliquis.   CT of the head negative for acute intracranial abnormalities.   CT Angio of the head and neck with no hemodynamically significant stenosis or acute vascular process.  Ulcerated vs chronic dissection R internal carotid artery.  Similarly occluded proximal L vertebral artery with reconstitution.  Moderate to severe canal stenosis c4-5.  Severe neural foraminal narrowing c3-4 and c4-5.  No emergent LVO or flow limiting stenosis.  Moderate stenosis L V4 segment.  MRI brain without acute intracranial abnormality Obtain 2  D Echo (normal EF, grade 1 dd, no cardiac source of emboli was identified) Check hemoglobin A1c (6.1) - LDL 68, HDL 33 continue Eliquis PT/ OT/SLP - of note, PT concerned for gait disturbance (freezing with any alterations, full body tremors during mobility - see note by Alben Deeds - discussed with neurology, who will see pt again tomorrow) Continue telemetry Neuro c/s, appreciate recs - suspect posterior circulation TIA, continue eliquis, recommending avoiding hypotension  Hypertension Imdur Lisinopril currently being held as well as lasix, will continue to hold for now with controlled BP's May use hydralazine for greater than 676 systolic or greater than 195 diastolic  History of CML, 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 Continue Gleevec in the morning.  History of ocular myasthenia Continue Mestinon Continue Chronic Tobramycin, eye drops   Hyperlipidemia Continue home statins  History of PE and DVT, remote.  Patient is on chronic Eliquis. Continue Eliquis during admission  Diastolic CHF: No acute decompensation weight 180 pounds   last 2 D echo in 2018, showed EF 55 to 09%, grade 1 diastolic, normal systolic.  New echo is pending (see below). Holding BP meds as above Obtain daily weights Monitor intake and output   Type II Diabetes  SSI  GERD, no acute symptoms Continue PPI  DVT prophylaxis: eliquis Code Status: full  Family Communication: wife at bedside Disposition Plan: pending  Consultants:   Neurology  Procedures:  Echo Study Conclusions  - Left ventricle: The cavity size was normal. There was mild   concentric hypertrophy. Systolic function was normal. The   estimated ejection fraction  was in the range of 50% to 55%. Wall   motion was normal; there were no regional wall motion   abnormalities. Doppler parameters are consistent with abnormal   left ventricular relaxation (grade 1 diastolic dysfunction).   Doppler parameters are  consistent with elevated ventricular   end-diastolic filling pressure. - Aortic valve: There was mild stenosis. There was trivial   regurgitation. Valve area (VTI): 2.08 cm^2. Valve area (Vmax):   1.86 cm^2. Valve area (Vmean): 1.81 cm^2. - Mitral valve: There was mild regurgitation. - Right ventricle: The cavity size was normal. Wall thickness was   normal. Systolic function was normal. - Right atrium: The atrium was normal in size. - Pulmonary arteries: Systolic pressure was within the normal   range. - Inferior vena cava: The vessel was normal in size. - Pericardium, extracardiac: There was no pericardial effusion.  Impressions:  - No cardiac source of emboli was indentified.  Antimicrobials:  Anti-infectives (From admission, onward)   None         Subjective: Still some nausea/mild dizziness, but not as bad as yesterday. Yesterday had dizziness, nausea, vomiting, debilitating. Similar to prior strokes. Some residual speech difficulty from prior strokes.  Objective: Vitals:   07/19/17 2245 07/20/17 0036 07/20/17 0455 07/20/17 0826  BP: (!) 149/71 110/66 125/60 (!) 142/75  Pulse: 70 70 75 73  Resp: 18 18 18 20   Temp: 97.9 F (36.6 C) 97.9 F (36.6 C) 98 F (36.7 C) 97.9 F (36.6 C)  TempSrc: Oral Oral Oral Oral  SpO2: 98% 98% 97% 100%  Weight:   80.1 kg (176 lb 9.4 oz)   Height:        Intake/Output Summary (Last 24 hours) at 07/20/2017 0947 Last data filed at 07/20/2017 0821 Gross per 24 hour  Intake 360 ml  Output -  Net 360 ml   Filed Weights   07/19/17 1431 07/19/17 1442 07/20/17 0455  Weight: 81.2 kg (179 lb) 81.6 kg (180 lb) 80.1 kg (176 lb 9.4 oz)    Examination:  General exam: Appears calm and comfortable  Respiratory system: Clear to auscultation. Respiratory effort normal. Cardiovascular system: S1 & S2 heard, RRR. No JVD, murmurs, rubs, gallops or clicks. No pedal edema. Gastrointestinal system: Abdomen is nondistended, soft and  nontender. No organomegaly or masses felt. Normal bowel sounds heard. Central nervous system: Alert and oriented. No focal neurological deficits. Extremities: Symmetric 5 x 5 power. Skin: No rashes, lesions or ulcers Psychiatry: Judgement and insight appear normal. Mood & affect appropriate.     Data Reviewed: I have personally reviewed following labs and imaging studies  CBC: Recent Labs  Lab 07/19/17 1457 07/19/17 1522 07/20/17 0525  WBC 9.1  --  7.2  NEUTROABS 6.4  --   --   HGB 12.5* 12.2* 11.2*  HCT 37.3* 36.0* 34.1*  MCV 98.2  --  98.0  PLT 188  --  416*   Basic Metabolic Panel: Recent Labs  Lab 07/19/17 1457 07/19/17 1522 07/20/17 0525  NA 140 139 140  K 4.0 4.2 4.0  CL 106 105 107  CO2 24  --  27  GLUCOSE 131* 129* 88  BUN 12 17 11   CREATININE 1.63* 1.60* 1.55*  CALCIUM 9.3  --  8.5*   GFR: Estimated Creatinine Clearance: 39.2 mL/min (Jayel Scaduto) (by C-G formula based on SCr of 1.55 mg/dL (H)). Liver Function Tests: Recent Labs  Lab 07/19/17 1457  AST 31  ALT 25  ALKPHOS 46  BILITOT 1.1  PROT 5.9*  ALBUMIN 3.7   Recent Labs  Lab 07/19/17 1457  LIPASE 38   No results for input(s): AMMONIA in the last 168 hours. Coagulation Profile: Recent Labs  Lab 07/19/17 1504  INR 1.25   Cardiac Enzymes: No results for input(s): CKTOTAL, CKMB, CKMBINDEX, TROPONINI in the last 168 hours. BNP (last 3 results) No results for input(s): PROBNP in the last 8760 hours. HbA1C: Recent Labs    07/20/17 0525  HGBA1C 6.1*   CBG: Recent Labs  Lab 07/19/17 1448 07/19/17 1657 07/19/17 2152 07/20/17 0614  GLUCAP 137* 119* 140* 90   Lipid Profile: Recent Labs    07/20/17 0525  CHOL 134  HDL 33*  LDLCALC 68  TRIG 166*  CHOLHDL 4.1   Thyroid Function Tests: No results for input(s): TSH, T4TOTAL, FREET4, T3FREE, THYROIDAB in the last 72 hours. Anemia Panel: No results for input(s): VITAMINB12, FOLATE, FERRITIN, TIBC, IRON, RETICCTPCT in the last 72  hours. Sepsis Labs: Recent Labs  Lab 07/19/17 1523 07/19/17 1715  LATICACIDVEN 2.22* 1.08    No results found for this or any previous visit (from the past 240 hour(s)).       Radiology Studies: Ct Angio Head W Or Wo Contrast  Result Date: 07/19/2017 CLINICAL DATA:  Follow up code stroke. History of hypertension, carotid artery disease. EXAM: CT ANGIOGRAPHY HEAD AND NECK TECHNIQUE: Multidetector CT imaging of the head and neck was performed using the standard protocol during bolus administration of intravenous contrast. Multiplanar CT image reconstructions and MIPs were obtained to evaluate the vascular anatomy. Carotid stenosis measurements (when applicable) are obtained utilizing NASCET criteria, using the distal internal carotid diameter as the denominator. CONTRAST:  1110mL ISOVUE-370 IOPAMIDOL (ISOVUE-370) INJECTION 76% COMPARISON:  CT HEAD July 19, 2017 and CT angiogram head and neck Jul 03, 2016. FINDINGS: CTA NECK FINDINGS: AORTIC ARCH: Normal appearance of the thoracic arch, normal branch pattern. Mild calcific atherosclerosis aortic arch. The origins of the innominate, left Common carotid artery and subclavian artery are widely patent. RIGHT CAROTID SYSTEM: Common carotid artery is patent. Severe calcific atherosclerosis and intimal thickening with similarly ulcerated RIGHT internal carotid artery origin versus old dissection. No hemodynamically significant stenosis by NASCET criteria. Patent internal carotid artery. LEFT CAROTID SYSTEM: Common carotid artery is patent. Mild calcific atherosclerosis of the carotid bifurcation without hemodynamically significant stenosis by NASCET criteria. Normal appearance of the internal carotid artery. VERTEBRAL ARTERIES:RIGHT vertebral artery is dominant. Moderate stenosis LEFT vertebral artery origin. Similarly attenuated and intermittently occluded LEFT vertebral artery with tandem reconstitution via muscular cutaneous branches. SKELETON: No acute  osseous process though bone windows have not been submitted. Moderate canal stenosis C3-4, moderate to severe canal stenosis C4-5, moderate at C6-7. Moderate LEFT C2-3, severe bilateral C3-4, severe bilateral C4-5 neural foraminal narrowing. OTHER NECK: Soft tissues of the neck are nonacute though, not tailored for evaluation. UPPER CHEST: Included lung apices are clear. No superior mediastinal lymphadenopathy. CTA HEAD FINDINGS: ANTERIOR CIRCULATION: Patent cervical internal carotid arteries, petrous, cavernous and supra clinoid internal carotid arteries. Patent anterior communicating artery. Patent anterior and middle cerebral arteries. No large vessel occlusion, significant stenosis, contrast extravasation or aneurysm. POSTERIOR CIRCULATION: Patent vertebral arteries, vertebrobasilar junction and basilar artery, as well as main branch vessels. Moderate calcific atherosclerosis RIGHT V4 segment resulting in mild stenosis. Luminal irregularity LEFT V4 segment is unchanged, possible dissection or collateral vessels, moderate stenosis mid to distal LEFT V4 segment. Patent posterior cerebral arteries. Robust bilateral posterior communicating arteries. Mild stenosis RIGHT P1 segment. No large vessel occlusion,  significant stenosis, contrast extravasation or aneurysm. VENOUS SINUSES: Major dural venous sinuses are patent though not tailored for evaluation on this angiographic examination. ANATOMIC VARIANTS: None. DELAYED PHASE: Not performed. MIP images reviewed. IMPRESSION: CTA neck: 1. No hemodynamically significant stenosis or acute vascular process. 2. 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 3. Similarly occluded proximal LEFT vertebral artery with reconstitution. 4. Moderate to severe canal stenosis C4-5. Severe neural foraminal narrowing C3-4 and C4-5. CTA head: 1. No emergent large vessel occlusion or flow-limiting stenosis. 2. Moderate stenosis LEFT V4 segment. Aortic  Atherosclerosis (ICD10-I70.0). Critical Value/emergent results text paged to [place] via AMION secure system on 07/19/2017 at 3:37 pm, including interpreting physician's phone number. Electronically Signed   By: Elon Alas M.D.   On: 07/19/2017 15:49   Ct Angio Neck W Or Wo Contrast  Result Date: 07/19/2017 CLINICAL DATA:  Follow up code stroke. History of hypertension, carotid artery disease. EXAM: CT ANGIOGRAPHY HEAD AND NECK TECHNIQUE: Multidetector CT imaging of the head and neck was performed using the standard protocol during bolus administration of intravenous contrast. Multiplanar CT image reconstructions and MIPs were obtained to evaluate the vascular anatomy. Carotid stenosis measurements (when applicable) are obtained utilizing NASCET criteria, using the distal internal carotid diameter as the denominator. CONTRAST:  115mL ISOVUE-370 IOPAMIDOL (ISOVUE-370) INJECTION 76% COMPARISON:  CT HEAD July 19, 2017 and CT angiogram head and neck Jul 03, 2016. FINDINGS: CTA NECK FINDINGS: AORTIC ARCH: Normal appearance of the thoracic arch, normal branch pattern. Mild calcific atherosclerosis aortic arch. The origins of the innominate, left Common carotid artery and subclavian artery are widely patent. RIGHT CAROTID SYSTEM: Common carotid artery is patent. Severe calcific atherosclerosis and intimal thickening with similarly ulcerated RIGHT internal carotid artery origin versus old dissection. No hemodynamically significant stenosis by NASCET criteria. Patent internal carotid artery. LEFT CAROTID SYSTEM: Common carotid artery is patent. Mild calcific atherosclerosis of the carotid bifurcation without hemodynamically significant stenosis by NASCET criteria. Normal appearance of the internal carotid artery. VERTEBRAL ARTERIES:RIGHT vertebral artery is dominant. Moderate stenosis LEFT vertebral artery origin. Similarly attenuated and intermittently occluded LEFT vertebral artery with tandem  reconstitution via muscular cutaneous branches. SKELETON: No acute osseous process though bone windows have not been submitted. Moderate canal stenosis C3-4, moderate to severe canal stenosis C4-5, moderate at C6-7. Moderate LEFT C2-3, severe bilateral C3-4, severe bilateral C4-5 neural foraminal narrowing. OTHER NECK: Soft tissues of the neck are nonacute though, not tailored for evaluation. UPPER CHEST: Included lung apices are clear. No superior mediastinal lymphadenopathy. CTA HEAD FINDINGS: ANTERIOR CIRCULATION: Patent cervical internal carotid arteries, petrous, cavernous and supra clinoid internal carotid arteries. Patent anterior communicating artery. Patent anterior and middle cerebral arteries. No large vessel occlusion, significant stenosis, contrast extravasation or aneurysm. POSTERIOR CIRCULATION: Patent vertebral arteries, vertebrobasilar junction and basilar artery, as well as main branch vessels. Moderate calcific atherosclerosis RIGHT V4 segment resulting in mild stenosis. Luminal irregularity LEFT V4 segment is unchanged, possible dissection or collateral vessels, moderate stenosis mid to distal LEFT V4 segment. Patent posterior cerebral arteries. Robust bilateral posterior communicating arteries. Mild stenosis RIGHT P1 segment. No large vessel occlusion, significant stenosis, contrast extravasation or aneurysm. VENOUS SINUSES: Major dural venous sinuses are patent though not tailored for evaluation on this angiographic examination. ANATOMIC VARIANTS: None. DELAYED PHASE: Not performed. MIP images reviewed. IMPRESSION: CTA neck: 1. No hemodynamically significant stenosis or acute vascular process. 2. 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 3. Similarly occluded proximal LEFT vertebral artery with reconstitution.  4. Moderate to severe canal stenosis C4-5. Severe neural foraminal narrowing C3-4 and C4-5. CTA head: 1. No emergent large vessel occlusion or  flow-limiting stenosis. 2. Moderate stenosis LEFT V4 segment. Aortic Atherosclerosis (ICD10-I70.0). Critical Value/emergent results text paged to [place] via AMION secure system on 07/19/2017 at 3:37 pm, including interpreting physician's phone number. Electronically Signed   By: Elon Alas M.D.   On: 07/19/2017 15:49   Mr Brain Wo Contrast  Result Date: 07/20/2017 CLINICAL DATA:  Initial evaluation for sudden onset dizziness, unsteady gait. EXAM: MRI HEAD WITHOUT CONTRAST TECHNIQUE: Multiplanar, multiecho pulse sequences of the brain and surrounding structures were obtained without intravenous contrast. COMPARISON:  Prior CTA from earlier the same day. FINDINGS: Brain: Generalized age-related cerebral atrophy. Patchy and confluent T2/FLAIR hyperintensity within the periventricular deep white matter both cerebral hemispheres, moderate to advanced in nature. Remote left cerebellar infarct with trace chronic hemosiderin staining. No abnormal foci of restricted diffusion to suggest acute or subacute ischemia. Gray-white matter differentiation maintained. No other areas of remote cortical infarction. No acute or chronic intracranial hemorrhage. No mass lesion, midline shift or mass effect. No hydrocephalus. No extra-axial fluid collection. Major dural sinuses grossly patent. Pituitary gland within normal limits. Vascular: Major intracranial vascular flow voids maintained. Left vertebral artery hypoplastic and not well visualized. Skull and upper cervical spine: Craniocervical junction within normal limits. Degenerative thickening at the tectorial membrane without significant stenosis. Moderate cervical spondylolysis at C3-4 and C4-5 with resultant mild to moderate spinal stenosis. Bone marrow signal intensity within normal limits. No focal marrow replacing lesion. Scalp soft tissues within normal limits. Sinuses/Orbits: Globes and orbital soft tissues within normal limits. Patient status post lens  extraction bilaterally. Moderate mucosal thickening within the right maxillary sinus. Mild mucosal thickening within the right sphenoid sinus. Paranasal sinuses are otherwise largely clear. Small bilateral mastoid effusions, likely sterile/benign. Inner ear structures grossly normal. Other: None. IMPRESSION: 1. No acute intracranial abnormality. 2. 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 Electronically Signed   By: Jeannine Boga M.D.   On: 07/20/2017 00:01   Ct Head Code Stroke Wo Contrast  Addendum Date: 07/19/2017   ADDENDUM REPORT: 07/19/2017 15:37 ADDENDUM: Critical Value/emergent results text paged to [place], Neurology via AMION secure system on 07/19/2017 at 3:32 pm, including interpreting physician's phone number. Electronically Signed   By: Elon Alas M.D.   On: 07/19/2017 15:37   Result Date: 07/19/2017 CLINICAL DATA:  Code stroke. Dizzy and nausea for an hour. History of stroke, hypertension, diabetes, carotid artery disease, pituitary tumor. EXAM: CT HEAD WITHOUT CONTRAST TECHNIQUE: Contiguous axial images were obtained from the base of the skull through the vertex without intravenous contrast. COMPARISON:  CT HEAD Jul 03, 2016. FINDINGS: BRAIN: No intraparenchymal hemorrhage, mass effect nor midline shift. The ventricles and sulci are normal for age. Patchy to confluent supratentorial white matter hypodensities. Old LEFT cerebellar infarct. No acute large vascular territory infarcts. No abnormal extra-axial fluid collections. Basal cisterns are patent. VASCULAR: Moderate calcific atherosclerosis of the carotid siphons. No abnormally dense vessel. SKULL: No skull fracture. 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 No significant scalp soft tissue swelling. SINUSES/ORBITS: Partially imaged inspissated RIGHT maxillary sinusitis. Included ocular globes and orbital contents are non-suspicious. Status post  bilateral ocular lens implants. OTHER: None. ASPECTS [facility] Stroke Program Early CT Score) - Ganglionic level infarction (caudate, lentiform nuclei, internal capsule, insula, M1-M3 cortex): 7 - Supraganglionic infarction (M4-M6 cortex): 3 Total score (0-10 with 10  being normal): 10 IMPRESSION: 1. No acute intracranial process. 2. ASPECTS is 10. 3. 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 Electronically Signed: By: Elon Alas M.D. On: 07/19/2017 15:34        Scheduled Meds: .  stroke: mapping our early stages of recovery book   Does not apply Once  . apixaban  5 mg Oral BID  . atorvastatin  80 mg Oral q1800  . brimonidine  1 drop Both Eyes BID   And  . timolol  1 drop Both Eyes BID  . famotidine  20 mg Oral BID  . imatinib  300 mg Oral Q1200  . insulin aspart  0-9 Units Subcutaneous TID WC  . isosorbide mononitrate  30 mg Oral Daily  . LORazepam  1 mg Intravenous Once  . pyridostigmine  60 mg Oral Daily  . tobramycin  1 drop Both Eyes QID   Continuous Infusions: . sodium chloride 100 mL/hr at 07/19/17 2208     LOS: 0 days    Time spent: over 30 min    Fayrene Helper, MD [facility] Pager [phone]  If 7PM-7AM, please contact night-coverage [email or web address] Password [facility] 07/20/2017, 9:47 AM

## 2017-07-21 DIAGNOSIS — I10 Essential (primary) hypertension: Secondary | ICD-10-CM

## 2017-07-21 DIAGNOSIS — Z9841 Cataract extraction status, right eye: Secondary | ICD-10-CM | POA: Diagnosis not present

## 2017-07-21 DIAGNOSIS — G8191 Hemiplegia, unspecified affecting right dominant side: Secondary | ICD-10-CM | POA: Diagnosis not present

## 2017-07-21 DIAGNOSIS — I69393 Ataxia following cerebral infarction: Secondary | ICD-10-CM | POA: Diagnosis not present

## 2017-07-21 DIAGNOSIS — Z832 Family history of diseases of the blood and blood-forming organs and certain disorders involving the immune mechanism: Secondary | ICD-10-CM | POA: Diagnosis not present

## 2017-07-21 DIAGNOSIS — G8929 Other chronic pain: Secondary | ICD-10-CM | POA: Diagnosis present

## 2017-07-21 DIAGNOSIS — D696 Thrombocytopenia, unspecified: Secondary | ICD-10-CM | POA: Diagnosis not present

## 2017-07-21 DIAGNOSIS — I493 Ventricular premature depolarization: Secondary | ICD-10-CM | POA: Diagnosis present

## 2017-07-21 DIAGNOSIS — R29700 NIHSS score 0: Secondary | ICD-10-CM | POA: Diagnosis present

## 2017-07-21 DIAGNOSIS — Z86010 Personal history of colonic polyps: Secondary | ICD-10-CM | POA: Diagnosis not present

## 2017-07-21 DIAGNOSIS — C9211 Chronic myeloid leukemia, BCR/ABL-positive, in remission: Secondary | ICD-10-CM | POA: Diagnosis not present

## 2017-07-21 DIAGNOSIS — D62 Acute posthemorrhagic anemia: Secondary | ICD-10-CM

## 2017-07-21 DIAGNOSIS — N183 Chronic kidney disease, stage 3 (moderate): Secondary | ICD-10-CM | POA: Diagnosis not present

## 2017-07-21 DIAGNOSIS — I639 Cerebral infarction, unspecified: Secondary | ICD-10-CM | POA: Diagnosis not present

## 2017-07-21 DIAGNOSIS — H409 Unspecified glaucoma: Secondary | ICD-10-CM | POA: Diagnosis present

## 2017-07-21 DIAGNOSIS — I2510 Atherosclerotic heart disease of native coronary artery without angina pectoris: Secondary | ICD-10-CM | POA: Diagnosis present

## 2017-07-21 DIAGNOSIS — I739 Peripheral vascular disease, unspecified: Secondary | ICD-10-CM

## 2017-07-21 DIAGNOSIS — Z803 Family history of malignant neoplasm of breast: Secondary | ICD-10-CM | POA: Diagnosis not present

## 2017-07-21 DIAGNOSIS — M810 Age-related osteoporosis without current pathological fracture: Secondary | ICD-10-CM | POA: Diagnosis present

## 2017-07-21 DIAGNOSIS — Z87442 Personal history of urinary calculi: Secondary | ICD-10-CM | POA: Diagnosis not present

## 2017-07-21 DIAGNOSIS — K219 Gastro-esophageal reflux disease without esophagitis: Secondary | ICD-10-CM | POA: Diagnosis present

## 2017-07-21 DIAGNOSIS — I69351 Hemiplegia and hemiparesis following cerebral infarction affecting right dominant side: Secondary | ICD-10-CM | POA: Diagnosis present

## 2017-07-21 DIAGNOSIS — G459 Transient cerebral ischemic attack, unspecified: Secondary | ICD-10-CM | POA: Diagnosis not present

## 2017-07-21 DIAGNOSIS — M4802 Spinal stenosis, cervical region: Secondary | ICD-10-CM | POA: Diagnosis present

## 2017-07-21 DIAGNOSIS — H9193 Unspecified hearing loss, bilateral: Secondary | ICD-10-CM

## 2017-07-21 DIAGNOSIS — R42 Dizziness and giddiness: Secondary | ICD-10-CM | POA: Diagnosis not present

## 2017-07-21 DIAGNOSIS — I69328 Other speech and language deficits following cerebral infarction: Secondary | ICD-10-CM | POA: Diagnosis not present

## 2017-07-21 DIAGNOSIS — E782 Mixed hyperlipidemia: Secondary | ICD-10-CM | POA: Diagnosis present

## 2017-07-21 DIAGNOSIS — I130 Hypertensive heart and chronic kidney disease with heart failure and stage 1 through stage 4 chronic kidney disease, or unspecified chronic kidney disease: Secondary | ICD-10-CM | POA: Diagnosis present

## 2017-07-21 DIAGNOSIS — R270 Ataxia, unspecified: Secondary | ICD-10-CM | POA: Diagnosis not present

## 2017-07-21 DIAGNOSIS — I5032 Chronic diastolic (congestive) heart failure: Secondary | ICD-10-CM | POA: Diagnosis not present

## 2017-07-21 DIAGNOSIS — E1151 Type 2 diabetes mellitus with diabetic peripheral angiopathy without gangrene: Secondary | ICD-10-CM | POA: Diagnosis present

## 2017-07-21 DIAGNOSIS — R262 Difficulty in walking, not elsewhere classified: Secondary | ICD-10-CM | POA: Diagnosis present

## 2017-07-21 DIAGNOSIS — E1143 Type 2 diabetes mellitus with diabetic autonomic (poly)neuropathy: Secondary | ICD-10-CM | POA: Diagnosis present

## 2017-07-21 DIAGNOSIS — G7000 Myasthenia gravis without (acute) exacerbation: Secondary | ICD-10-CM

## 2017-07-21 DIAGNOSIS — E1159 Type 2 diabetes mellitus with other circulatory complications: Secondary | ICD-10-CM

## 2017-07-21 DIAGNOSIS — Z823 Family history of stroke: Secondary | ICD-10-CM | POA: Diagnosis not present

## 2017-07-21 DIAGNOSIS — Z86711 Personal history of pulmonary embolism: Secondary | ICD-10-CM | POA: Diagnosis not present

## 2017-07-21 DIAGNOSIS — I129 Hypertensive chronic kidney disease with stage 1 through stage 4 chronic kidney disease, or unspecified chronic kidney disease: Secondary | ICD-10-CM | POA: Diagnosis present

## 2017-07-21 DIAGNOSIS — R29703 NIHSS score 3: Secondary | ICD-10-CM | POA: Diagnosis present

## 2017-07-21 DIAGNOSIS — G4700 Insomnia, unspecified: Secondary | ICD-10-CM | POA: Diagnosis present

## 2017-07-21 DIAGNOSIS — M549 Dorsalgia, unspecified: Secondary | ICD-10-CM | POA: Diagnosis present

## 2017-07-21 DIAGNOSIS — R260 Ataxic gait: Secondary | ICD-10-CM | POA: Diagnosis present

## 2017-07-21 DIAGNOSIS — Z9842 Cataract extraction status, left eye: Secondary | ICD-10-CM | POA: Diagnosis not present

## 2017-07-21 DIAGNOSIS — Z8249 Family history of ischemic heart disease and other diseases of the circulatory system: Secondary | ICD-10-CM | POA: Diagnosis not present

## 2017-07-21 DIAGNOSIS — G458 Other transient cerebral ischemic attacks and related syndromes: Secondary | ICD-10-CM | POA: Diagnosis present

## 2017-07-21 DIAGNOSIS — K3184 Gastroparesis: Secondary | ICD-10-CM | POA: Diagnosis present

## 2017-07-21 DIAGNOSIS — E1122 Type 2 diabetes mellitus with diabetic chronic kidney disease: Secondary | ICD-10-CM | POA: Diagnosis present

## 2017-07-21 LAB — GLUCOSE, CAPILLARY
GLUCOSE-CAPILLARY: 108 mg/dL — AB (ref 65–99)
GLUCOSE-CAPILLARY: 137 mg/dL — AB (ref 65–99)
Glucose-Capillary: 84 mg/dL (ref 65–99)
Glucose-Capillary: 161 mg/dL — ABNORMAL HIGH (ref 65–99)

## 2017-07-21 LAB — BASIC METABOLIC PANEL
ANION GAP: 9 (ref 5–15)
BUN: 10 mg/dL (ref 6–20)
CHLORIDE: 109 mmol/L (ref 101–111)
CO2: 23 mmol/L (ref 22–32)
Calcium: 8.3 mg/dL — ABNORMAL LOW (ref 8.9–10.3)
Creatinine, Ser: 1.41 mg/dL — ABNORMAL HIGH (ref 0.61–1.24)
GFR calc Af Amer: 53 mL/min — ABNORMAL LOW (ref >60)
GFR, EST NON AFRICAN AMERICAN: 46 mL/min — AB (ref >60)
GLUCOSE: 93 mg/dL (ref 65–99)
POTASSIUM: 3.9 mmol/L (ref 3.5–5.1)
Sodium: 141 mmol/L (ref 135–145)

## 2017-07-21 LAB — CBC
HCT: 32.9 % — ABNORMAL LOW (ref 39.0–52.0)
HEMOGLOBIN: 10.7 g/dL — AB (ref 13.0–17.0)
MCH: 32.5 pg (ref 26.0–34.0)
MCHC: 32.5 g/dL (ref 30.0–36.0)
MCV: 100 fL (ref 78.0–100.0)
Platelets: 136 10*3/uL — ABNORMAL LOW (ref 150–400)
RBC: 3.29 MIL/uL — AB (ref 4.22–5.81)
RDW: 14.3 % (ref 11.5–15.5)
WBC: 6.2 10*3/uL (ref 4.0–10.5)

## 2017-07-21 LAB — MAGNESIUM: MAGNESIUM: 1.8 mg/dL (ref 1.7–2.4)

## 2017-07-21 MED ORDER — PREDNISONE 1 MG PO TABS
3.0000 mg | ORAL_TABLET | Freq: Every day | ORAL | Status: DC
  Administered 2017-07-22 – 2017-07-23 (×2): 3 mg via ORAL
  Filled 2017-07-21 (×2): qty 3

## 2017-07-21 MED ORDER — PREDNISONE 20 MG PO TABS
20.0000 mg | ORAL_TABLET | Freq: Two times a day (BID) | ORAL | Status: DC
  Administered 2017-07-21: 20 mg via ORAL
  Filled 2017-07-21: qty 1

## 2017-07-21 NOTE — Progress Notes (Signed)
PROGRESS NOTE  [name] YBF:[id number] DOB: [birth year] DOA: 07/19/2017 PCP: Renato Shin, MD  Brief Narrative: 77 year old male PMH cerebellar stroke, currently on Eliquis, suddenly became diaphoretic, staggered and vomited, dizzy.  Presented to the emergency department where CT scan did not show acute stroke but did show old left cerebellar stroke.  Not a candidate for TPA as the patient had taken apixaban at home.  Neurology suspected posterior circulation TIA and recommended continuing Eliquis, avoid hypotension.  Patient did develop gait disturbance and tremors.  Was reevaluated by neurology.  Assessment/Plan Posterior circulation TIA, recrudescence of previous stroke symptoms.  Gait ataxia noted.  Imaging studies as documented by neurology. --Continue apixaban per neurology. --No further recommendations per neurology.  Thrombocytopenia.  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 --Stable.  Follow CBC.  Ocular myasthenia.  Continue Mestinon.  PMH DVT, PE.  Continue apixaban.  Chronic diastolic CHF.  Stable.   We will reevaluate tomorrow.  May need skilled rehab versus CIR.  DVT prophylaxis: enoxaparin Code Status: full Family Communication: none Disposition Plan: pending    Murray Hodgkins, MD  [facility] Direct contact: [phone] --Via amion app OR  --[email or web address]; password [email or web address]  7PM-7AM contact night coverage as above 07/21/2017, 8:22 PM  LOS: 0 days   Consultants:  Neurology   Procedures:  Echo Study Conclusions  - Left ventricle: The cavity size was normal. There was mild   concentric hypertrophy. Systolic function was normal. The   estimated ejection fraction was in the range of 50% to 55%. Wall   motion was normal; there were no regional wall motion   abnormalities. Doppler parameters are consistent with abnormal   left ventricular relaxation (grade 1 diastolic dysfunction).   Doppler parameters are consistent with elevated ventricular  end-diastolic filling pressure. - Aortic valve: There was mild stenosis. There was trivial   regurgitation. Valve area (VTI): 2.08 cm^2. Valve area (Vmax):   1.86 cm^2. Valve area (Vmean): 1.81 cm^2. - Mitral valve: There was mild regurgitation. - Right ventricle: The cavity size was normal. Wall thickness was   normal. Systolic function was normal. - Right atrium: The atrium was normal in size. - Pulmonary arteries: Systolic pressure was within the normal   range. - Inferior vena cava: The vessel was normal in size. - Pericardium, extracardiac: There was no pericardial effusion.  Impressions:  - No cardiac source of emboli was indentified.  Antimicrobials:    Interval history/Subjective: Feels fine, no complaints other than some eye irritation.  However in discussion with physical therapy and neurology, patient noted to be weak and not felt to be safe for discharge home.  Objective: Vitals:  Vitals:   07/21/17 1555 07/21/17 1941  BP: (!) 150/70 (!) 158/75  Pulse: 62 78  Resp: 16 18  Temp: 97.9 F (36.6 C) 98.2 F (36.8 C)  SpO2: 100% 99%    Exam:  Constitutional:  . Appears calm and comfortable Eyes:  . pupils and irises appear normal . Bilateral scleral injection noted. ENMT:  . grossly normal hearing  . Lips appear normal Respiratory:  . CTA bilaterally, no w/r/r.  . Respiratory effort normal.  Cardiovascular:  . RRR, no m/r/g . No LE extremity edema   Psychiatric:  . Mental status o Mood, affect appropriate Moves all extremities to command.  I have personally reviewed the following:   Labs:  Blood sugars stable.  Creatinine stable, 1.41.  Hemoglobin stable 10.7.  Platelets stable, 136  Imaging studies:  CTA head and neck noted.  MRI brain  no acute intracranial abnormality  Scheduled Meds: .  stroke: mapping our early stages of recovery book   Does not apply Once  . apixaban  5 mg Oral BID  . brimonidine  1 drop Both Eyes BID   And    . timolol  1 drop Both Eyes BID  . famotidine  20 mg Oral BID  . imatinib  300 mg Oral Q1200  . insulin aspart  0-9 Units Subcutaneous TID WC  . isosorbide mononitrate  30 mg Oral Daily  . LORazepam  1 mg Intravenous Once  . [START ON 07/22/2017] predniSONE  3 mg Oral Q breakfast  . pyridostigmine  60 mg Oral Daily  . rosuvastatin  5 mg Oral q1800  . 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

## 2017-07-21 NOTE — Consult Note (Signed)
Physical Medicine and Rehabilitation Consult   Reason for Consult: TIA with balance deficits.  Referring Physician: Dr. Leonie Man.    HPI: [name] is a 77 y.o. male with history of T2DM, MG, PE, PAD, CKD, CML, cerebellar stroke who was admitted on 07/19/17 with sudden onset of N/V and difficulty walking. CT head negative for bleed but tPA not given as patient had taken Eliquis that am . CTA head/neck was negative for emergent large vessel occlusion with chronic occluded L-VA and chronic dissected or ulcerated R-ICA. MRI brain reviewed, unremarkable for acute intracranial process. Per report, chronic moderate to advanced small vessel disease, remote left cerebellar infarct and was negative for acute changes. 2D echo showed EF 50-55 % with mild AVS. 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 To continue Eliquis, avoid dehydration and hypotension. Patient continues to be limited by BLE weakness with instability and has decline in functional status therefore CIR recommended for follow up therapy.    Review of Systems  Constitutional: Negative for chills and fever.  HENT: Positive for hearing loss. Negative for tinnitus.   Eyes: Negative for blurred vision and double vision.  Respiratory: Negative for cough and shortness of breath.   Cardiovascular: Negative for chest pain and palpitations.  Gastrointestinal: Positive for diarrhea (occasionally due to [place]). Negative for constipation, heartburn and nausea.  Genitourinary: Negative for dysuria and urgency.  Musculoskeletal: Positive for back pain and myalgias. Negative for joint pain.       "Legs ache all the time--able to walk a few feet to let legs catch up".  Rib pain  Neurological: Positive for dizziness. Negative for speech change, focal weakness and headaches.  Psychiatric/Behavioral: Positive for suicidal  ideas. Negative for depression.  All other systems reviewed and are negative.    Past Medical History:  Diagnosis Date  . Bruises easily    d/t being on Eliquis  . Carotid artery disease ([place])    a. Duplex 05/2014: 35-00% RICA, 9-38% LICA, elevated velocities in right subclavian artery, normal left subclavian artery..  . Chronic back pain    HNP  . Chronic kidney disease (CKD)   . Claustrophobia    takes Ativan as needed  . 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 Coronary atherosclerosis of unspecified type of vessel, native or graft    a. cath in 2003 showed 10-20% LM, scattered 20% prox-mid LAD, 30% more distal LAD, 50-60% stenosis of LAD towards apex, 20% Cx, 30% dRCA, focal 95% stenosis in smaller of 2 branches of PDA, EF 60-65%.  . Diabetes mellitus ([place])   . Diarrhea    takes Imodium daily as needed  . Diverticulosis   . Diverticulosis of colon (without mention of hemorrhage)   . DJD (degenerative joint disease)   . DVT (deep venous thrombosis) ([place]) 07/2011  . Esophageal stricture   . Essential hypertension    takes Imdur and Lisinopril daily  . Gastric polyps    benign  . GERD (gastroesophageal reflux disease)    takes Nexium daily  . Glaucoma    uses eye drops  . History of bronchitis    not sure when the last time  . History of colon polyps    benign  . History of kidney stones   . History of kidney stones   . Insomnia    takes Melatonin nightly  as needed  . Mixed hyperlipidemia    takes Crestor daily  . Myasthenia gravis ([place])    uses prednisone  . Osteoporosis 2016  . Peripheral edema    takes Lasix daily  . Pituitary tumor    checks Dr[facility]Walden at [facility] checks it every Jan   . Pneumonia 01/2016  . PONV (postoperative nausea and vomiting)   . Ptosis   . Pulmonary embolus ([place]) 2012  . Stroke [facility]) 06/2015    Past Surgical History:  Procedure Laterality Date  . ABDOMINAL AORTOGRAM W/LOWER EXTREMITY N/A 11/18/2016    Procedure: ABDOMINAL AORTOGRAM W/LOWER EXTREMITY;  Surgeon: [name], MD;  Location: [facility] CV LAB;  Service: Cardiovascular;  Laterality: N/A;  . BACK SURGERY     x2, lumbar and cervical  . CARDIAC CATHETERIZATION    . CATARACT EXTRACTION Bilateral 12/12  . COLONOSCOPY    . ESOPHAGOGASTRODUODENOSCOPY ENDOSCOPY  04/2015  . IR GENERIC HISTORICAL  03/03/2016   IR IVC FILTER PLMT / S&I [name] GUID/MOD SED 03/03/2016 [name], MD [facility]  . IR GENERIC HISTORICAL  04/09/2016   IR RADIOLOGIST EVAL & MGMT 04/09/2016 [name], MD [facility] INTER[facility]  . IR GENERIC HISTORICAL  04/30/2016   IR IVC FILTER RETRIEVAL / S&I [name] GUID/MOD SED 04/30/2016 [name], MD [facility]-INTERV RAD  . LITHOTRIPSY    . LUMBAR LAMINECTOMY/DECOMPRESSION MICRODISCECTOMY Right 09/14/2012   Procedure: Right Lumbar three-four, four-five, Lumbar five-Sacral one decompressive laminectomy;  Surgeon: [name], MD;  Location: [place] [facility]URO OR[facility];  Service: Neurosurgery;  Laterality: Right;  . LUMBAR LAMINECTOMY/DECOMPRESSION MICRODISCECTOMY Left 03/11/2016   Procedure: Left Lumbar Two-ThreeMicrodiscectomy;  Surgeon: [name], MD;  Location: [facility];  Service: Neurosurgery;  Laterality: Left;  . PERIPHERAL VASCULAR INTERVENTION  11/18/2016   Procedure: PERIPHERAL VASCULAR INTERVENTION;  Surgeon: [name], MD;  Location: [facility] CV LAB;  Service: Cardiovascular;;  Left popliteal  . SHOULDER SURGERY Left 05/07/2009  . TONSILLECTOMY      Family History  Problem Relation Age of Onset  . Heart disease Mother   . Heart attack Mother   . Stroke Mother   . Heart disease Father   . Heart attack Father   . Stroke Father   . Breast cancer Sister        Twin   . Heart attack Sister   . Dementia Sister   . Heart disease Sister   . Heart attack Sister   . Clotting disorder Sister   . Heart attack Sister   . Colon cancer Neg Hx     Social History:  Married. Retired --used to be a Freight forwarder.  Independent  PTA without AD. He reports that he has never smoked. He has never used smokeless tobacco. He reports that he does not drink alcohol or use drugs.    Allergies  Allergen Reactions  . Phenergan [Promethazine Hcl] Swelling and Other (See Comments)    Cannot be combined with Zofran because swelling of the eyes resulted and patient became very restless-  . Zofran [Ondansetron Hcl] Swelling and Other (See Comments)    Cannot be combined with Phenergan because swelling of the eyes resulted and patient became very restless-  . Sulfamethoxazole Rash and Itching    Medications Prior to Admission  Medication Sig Dispense Refill  . brimonidine-timolol (COMBIGAN) 0.2-0.5 % ophthalmic solution Place 1 drop into both eyes every 12 (twelve) hours.    . cholecalciferol (VITAMIN D) 1000 units tablet Take 1,000 Units by mouth daily.     Marland Kitchen  ELIQUIS 5 MG TABS tablet TAKE 1 TABLET (5 MG TOTAL) BY MOUTH 2 (TWO) TIMES DAILY. 60 tablet 3  . furosemide (LASIX) 20 MG tablet TAKE 1 TABLET BY MOUTH DAILY. 90 tablet 2  . imatinib (GLEEVEC) 100 MG tablet Take 300 mg by mouth daily at 12 noon. Take with meals and large glass of water.Caution:Chemotherapy     . isosorbide mononitrate (IMDUR) 30 MG 24 hr tablet Take 1 tablet (30 mg total) by mouth daily. 90 tablet 3  . lidocaine (LIDODERM) 5 % Place 1 patch onto the skin daily as needed (for back pain). Remove & Discard patch within 12 hours or as directed by MD     . lisinopril (PRINIVIL,ZESTRIL) 5 MG tablet Take 1 tablet (5 mg total) by mouth daily. 30 tablet 11  . loperamide (IMODIUM) 2 MG capsule Take 2-4 mg by mouth 3 (three) times daily as needed for diarrhea or loose stools.     Marland Kitchen LORazepam (ATIVAN) 0.5 MG tablet TAKE 1 TABLET BY MOUTH AT BEDTIME AS NEEDED FOR SLEEP 30 tablet 3  . Melatonin 3 MG TABS Take 3 mg by mouth at bedtime as needed (for sleep).     . Multiple Vitamin (MULTIVITAMIN) tablet Take 1 tablet by mouth daily.    . ondansetron (ZOFRAN ODT) 8 MG  disintegrating tablet Take 1 tablet (8 mg total) by mouth every 8 (eight) hours as needed for nausea or vomiting. 90 tablet 1  . predniSONE (DELTASONE) 1 MG tablet Take 3 tablets (3 mg total) by mouth daily with breakfast. 270 tablet 1  . pyridostigmine (MESTINON) 60 MG tablet Take 60 mg by mouth daily.    . ranitidine (ZANTAC) 150 MG tablet Take 150 mg by mouth 2 (two) times daily as needed for heartburn.    . rosuvastatin (CRESTOR) 10 MG tablet Take 5 mg by mouth daily.    Marland Kitchen tobramycin (TOBREX) 0.3 % ophthalmic solution Place 1 drop into both eyes 4 (four) times daily. FOR 3 WEEKS  3  . traMADol (ULTRAM) 50 MG tablet Take 2 tablets (100 mg total) by mouth every 8 (eight) hours. (Patient taking differently: Take 50 mg by mouth every 8 (eight) hours as needed (for pain). ) 15 tablet 0  . amoxicillin-clavulanate (AUGMENTIN) 875-125 MG tablet Take 1 tablet by mouth 2 (two) times daily. One po bid x 7 days (Patient not taking: Reported on 07/19/2017) 14 tablet 0  . Fluticasone-Salmeterol (ADVAIR DISKUS) 100-50 MCG/DOSE AEPB Inhale 1 puff into the lungs 2 (two) times daily. (Patient not taking: Reported on 07/19/2017) 1 each 1  . NITROSTAT 0.4 MG SL tablet PLACE 1 TABLET UNDER THE TONGUE EVERY 5 MINUTES X3 DOSES AS NEEDED FOR CHEST PAIN (Patient not taking: Reported on 07/19/2017) 100 tablet 1  . predniSONE (DELTASONE) 20 MG tablet Take 1 tablet (20 mg total) by mouth 2 (two) times daily. (Patient not taking: Reported on 07/19/2017) 10 tablet 0  . promethazine-codeine (PHENERGAN WITH CODEINE) 6.25-10 MG/5ML syrup Take 2.5 mLs by mouth every 4 (four) hours as needed for cough. (Patient not taking: Reported on 07/19/2017) 120 mL 0    Home: Home Living Family/patient expects to be discharged to:: Private residence Living Arrangements: Spouse/significant other Available Help at Discharge: Family Type of Home: House Home Access: Stairs to enter Technical brewer of Steps: 3 Entrance Stairs-Rails: Right,  Left Home Layout: Two level, Bed/bath upstairs Bathroom Shower/Tub: Multimedia programmer: Standard Bathroom Accessibility: Yes Home Equipment: Shower seat - built in  Lives  With: Spouse  Functional History: Prior Function Level of Independence: Independent Functional Status:  Mobility: Bed Mobility Overal bed mobility: Needs Assistance Bed Mobility: Rolling, Supine to Sit, Sit to Supine Rolling: Supervision Supine to sit: Min assist Sit to supine: Min assist General bed mobility comments: Supervision for safety Transfers Overall transfer level: Needs assistance Equipment used: 1 person hand held assist Transfers: Sit to/from Stand Sit to Stand: Min assist General transfer comment: Patient with heavy posterior list when attempting to rise to standing, required physical assist to perform transfer with multiple attempts to initiate power up Ambulation/Gait Ambulation/Gait assistance: Min assist, Max assist(6 episodes of LOB) Ambulation Distance (Feet): 130 Feet Assistive device: 1 person hand held assist Gait Pattern/deviations: Ataxic, Narrow base of support, Staggering right, Staggering left, Leaning posteriorly General Gait Details: patient with several LOb noted during ambulation despite hands on assist with wrap around gait belt    ADL: ADL Overall ADL's : Needs assistance/impaired Grooming: Moderate assistance, Standing, Oral care, Wash/dry face, Wash/dry hands Grooming Details (indicate cue type and reason): pt with posterior lean. pt noted to have toes off the ground and posterior lean without awareness. pt does not attempt to self correct until therapist is already aiding with phsycial assistance. pt bracing on all surfaces with sit<>Stand transfer. pt does not recognize this and actually states "i think i am stronger and better today"  Lower Body Bathing: Moderate assistance, Sit to/from stand Toilet Transfer: Minimal assistance, RW, BSC Toilet Transfer  Details (indicate cue type and reason): pt with less (A) requires with bathroom transfer with use of RW and but patient demonstrates safety errors with RW . pt walking outside of the frame of the RW. pt pushing RW to the side and attempting to reach to the lateral side of RW. pt needs mod cues for RW safety. pt again demonstrates posterior LOB with correction by therapist. pt attempting to exit the bathroom without (A).  Functional mobility during ADLs: Moderate assistance General ADL Comments: pt eager to get OOB and reports great night sleep. pt complete adl task at the sink demonstrating posterior LOB and decr fine/ gross motor . pt with tremor incr in intensity with movement for adl task. pt with lack of awareness. OT transfering patient without RW to assess balance further this session and bil LE buckle once and x2 LLE buckle. pt seems not alarmed by the event and reports feeling that he is better at this time.   Cognition: Cognition Overall Cognitive Status: Impaired/Different from baseline Arousal/Alertness: Awake/alert Orientation Level: Oriented X4 Attention: Alternating Alternating Attention: Appears intact Memory: Impaired Memory Impairment: Storage deficit, Retrieval deficit Awareness: Impaired Awareness Impairment: Emergent impairment Problem Solving: (tba) Safety/Judgment: Other (comment) Comments: answered questions appropriately per testing; functional deficits noted per OT/PT Cognition Arousal/Alertness: Awake/alert Behavior During Therapy: WFL for tasks assessed/performed Overall Cognitive Status: Impaired/Different from baseline Area of Impairment: Safety/judgement, Awareness Safety/Judgement: Decreased awareness of deficits, Decreased awareness of safety Awareness: Emergent General Comments: decreased awareness of deficits thorughout session   Blood pressure 140/60, pulse (!) 59, temperature 97.9 F (36.6 C), temperature source Oral, resp. rate 16, height 5\' 7"  (1.702  m), weight 80.1 kg (176 lb 9.4 oz), SpO2 100 %. Physical Exam  Vitals reviewed. Constitutional: He is oriented to person, place, and time. He appears well-developed and well-nourished.  HENT:  Head: Normocephalic and atraumatic.  Eyes: EOM are normal. Right eye exhibits no discharge. Left eye exhibits no discharge.  Injected sclera  Neck: Normal range of motion. Neck supple.  Cardiovascular: Normal rate and regular rhythm.  Respiratory: Effort normal and breath sounds normal.  GI: Soft. Bowel sounds are normal.  Musculoskeletal:  No edema or tenderness in extremities  Neurological: He is alert and oriented to person, place, and time.  Motor: 4--4/5 grossly throughout No ataxia Sensation intact to light touch  Skin: Skin is warm and dry.  Psychiatric: He has a normal mood and affect. His behavior is normal.    Results for orders placed or performed during the hospital encounter of 07/19/17 (from the past 24 hour(s))  Glucose, capillary     Status: Abnormal   Collection Time: 07/20/17  4:02 PM  Result Value Ref Range   Glucose-Capillary 101 (H) 65 - 99 mg/dL   Comment 1 Notify RN    Comment 2 Document in Chart   Glucose, capillary     Status: Abnormal   Collection Time: 07/20/17  9:31 PM  Result Value Ref Range   Glucose-Capillary 125 (H) 65 - 99 mg/dL   Comment 1 Notify RN    Comment 2 Document in Chart   CBC     Status: Abnormal   Collection Time: 07/21/17  4:31 AM  Result Value Ref Range   WBC 6.2 4.0 - 10.5 K/uL   RBC 3.29 (L) 4.22 - 5.81 MIL/uL   Hemoglobin 10.7 (L) 13.0 - 17.0 g/dL   HCT 32.9 (L) 39.0 - 52.0 %   MCV 100.0 78.0 - 100.0 fL   MCH 32.5 26.0 - 34.0 pg   MCHC 32.5 30.0 - 36.0 g/dL   RDW 14.3 11.5 - 15.5 %   Platelets 136 (L) 150 - 400 K/uL  Basic metabolic panel     Status: Abnormal   Collection Time: 07/21/17  4:31 AM  Result Value Ref Range   Sodium 141 135 - 145 mmol/L   Potassium 3.9 3.5 - 5.1 mmol/L   Chloride 109 101 - 111 mmol/L   CO2 23 22 -  32 mmol/L   Glucose, Bld 93 65 - 99 mg/dL   BUN 10 6 - 20 mg/dL   Creatinine, Ser 1.41 (H) 0.61 - 1.24 mg/dL   Calcium 8.3 (L) 8.9 - 10.3 mg/dL   GFR calc non Af Amer 46 (L) >60 mL/min   GFR calc Af Amer 53 (L) >60 mL/min   Anion gap 9 5 - 15  Magnesium     Status: None   Collection Time: 07/21/17  4:31 AM  Result Value Ref Range   Magnesium 1.8 1.7 - 2.4 mg/dL  Glucose, capillary     Status: None   Collection Time: 07/21/17  6:18 AM  Result Value Ref Range   Glucose-Capillary 84 65 - 99 mg/dL   Comment 1 Notify RN    Comment 2 Document in Chart   Glucose, capillary     Status: Abnormal   Collection Time: 07/21/17 11:13 AM  Result Value Ref Range   Glucose-Capillary 108 (H) 65 - 99 mg/dL   Comment 1 Notify RN    Comment 2 Document in Chart    Ct Angio Head W Or Wo Contrast  Result Date: 07/19/2017 CLINICAL DATA:  Follow up code stroke. History of hypertension, carotid artery disease. EXAM: CT ANGIOGRAPHY HEAD AND NECK TECHNIQUE: Multidetector CT imaging of the head and neck was performed using the standard protocol during bolus administration of intravenous contrast. Multiplanar CT image reconstructions and MIPs were obtained to evaluate the vascular anatomy. Carotid stenosis measurements (when applicable) are obtained utilizing NASCET criteria, using  the distal internal carotid diameter as the denominator. CONTRAST:  168mL ISOVUE-370 IOPAMIDOL (ISOVUE-370) INJECTION 76% COMPARISON:  CT HEAD July 19, 2017 and CT angiogram head and neck Jul 03, 2016. FINDINGS: CTA NECK FINDINGS: AORTIC ARCH: Normal appearance of the thoracic arch, normal branch pattern. Mild calcific atherosclerosis aortic arch. The origins of the innominate, left Common carotid artery and subclavian artery are widely patent. RIGHT CAROTID SYSTEM: Common carotid artery is patent. Severe calcific atherosclerosis and intimal thickening with similarly ulcerated RIGHT internal carotid artery origin versus old dissection. No  hemodynamically significant stenosis by NASCET criteria. Patent internal carotid artery. LEFT CAROTID SYSTEM: Common carotid artery is patent. Mild calcific atherosclerosis of the carotid bifurcation without hemodynamically significant stenosis by NASCET criteria. Normal appearance of the internal carotid artery. VERTEBRAL ARTERIES:RIGHT vertebral artery is dominant. Moderate stenosis LEFT vertebral artery origin. Similarly attenuated and intermittently occluded LEFT vertebral artery with tandem reconstitution via muscular cutaneous branches. SKELETON: No acute osseous process though bone windows have not been submitted. Moderate canal stenosis C3-4, moderate to severe canal stenosis C4-5, moderate at C6-7. Moderate LEFT C2-3, severe bilateral C3-4, severe bilateral C4-5 neural foraminal narrowing. OTHER NECK: Soft tissues of the neck are nonacute though, not tailored for evaluation. UPPER CHEST: Included lung apices are clear. No superior mediastinal lymphadenopathy. CTA HEAD FINDINGS: ANTERIOR CIRCULATION: Patent cervical internal carotid arteries, petrous, cavernous and supra clinoid internal carotid arteries. Patent anterior communicating artery. Patent anterior and middle cerebral arteries. No large vessel occlusion, significant stenosis, contrast extravasation or aneurysm. POSTERIOR CIRCULATION: Patent vertebral arteries, vertebrobasilar junction and basilar artery, as well as main branch vessels. Moderate calcific atherosclerosis RIGHT V4 segment resulting in mild stenosis. Luminal irregularity LEFT V4 segment is unchanged, possible dissection or collateral vessels, moderate stenosis mid to distal LEFT V4 segment. Patent posterior cerebral arteries. Robust bilateral posterior communicating arteries. Mild stenosis RIGHT P1 segment. No large vessel occlusion, significant stenosis, contrast extravasation or aneurysm. VENOUS SINUSES: Major dural venous sinuses are patent though not tailored for evaluation on this  angiographic examination. ANATOMIC VARIANTS: None. DELAYED PHASE: Not performed. MIP images reviewed. IMPRESSION: CTA neck: 1. No hemodynamically significant stenosis or acute vascular process. 2. 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 3. Similarly occluded proximal LEFT vertebral artery with reconstitution. 4. Moderate to severe canal stenosis C4-5. Severe neural foraminal narrowing C3-4 and C4-5. CTA head: 1. No emergent large vessel occlusion or flow-limiting stenosis. 2. Moderate stenosis LEFT V4 segment. Aortic Atherosclerosis (ICD10-I70.0). Critical Value/emergent results text paged to [place] via AMION secure system on 07/19/2017 at 3:37 pm, including interpreting physician's phone number. Electronically Signed   By: Elon Alas M.D.   On: 07/19/2017 15:49   Ct Angio Neck W Or Wo Contrast  Result Date: 07/19/2017 CLINICAL DATA:  Follow up code stroke. History of hypertension, carotid artery disease. EXAM: CT ANGIOGRAPHY HEAD AND NECK TECHNIQUE: Multidetector CT imaging of the head and neck was performed using the standard protocol during bolus administration of intravenous contrast. Multiplanar CT image reconstructions and MIPs were obtained to evaluate the vascular anatomy. Carotid stenosis measurements (when applicable) are obtained utilizing NASCET criteria, using the distal internal carotid diameter as the denominator. CONTRAST:  172mL ISOVUE-370 IOPAMIDOL (ISOVUE-370) INJECTION 76% COMPARISON:  CT HEAD July 19, 2017 and CT angiogram head and neck Jul 03, 2016. FINDINGS: CTA NECK FINDINGS: AORTIC ARCH: Normal appearance of the thoracic arch, normal branch pattern. Mild calcific atherosclerosis aortic arch. The origins of the innominate, left Common carotid artery and subclavian artery  are widely patent. RIGHT CAROTID SYSTEM: Common carotid artery is patent. Severe calcific atherosclerosis and intimal thickening with similarly ulcerated RIGHT internal  carotid artery origin versus old dissection. No hemodynamically significant stenosis by NASCET criteria. Patent internal carotid artery. LEFT CAROTID SYSTEM: Common carotid artery is patent. Mild calcific atherosclerosis of the carotid bifurcation without hemodynamically significant stenosis by NASCET criteria. Normal appearance of the internal carotid artery. VERTEBRAL ARTERIES:RIGHT vertebral artery is dominant. Moderate stenosis LEFT vertebral artery origin. Similarly attenuated and intermittently occluded LEFT vertebral artery with tandem reconstitution via muscular cutaneous branches. SKELETON: No acute osseous process though bone windows have not been submitted. Moderate canal stenosis C3-4, moderate to severe canal stenosis C4-5, moderate at C6-7. Moderate LEFT C2-3, severe bilateral C3-4, severe bilateral C4-5 neural foraminal narrowing. OTHER NECK: Soft tissues of the neck are nonacute though, not tailored for evaluation. UPPER CHEST: Included lung apices are clear. No superior mediastinal lymphadenopathy. CTA HEAD FINDINGS: ANTERIOR CIRCULATION: Patent cervical internal carotid arteries, petrous, cavernous and supra clinoid internal carotid arteries. Patent anterior communicating artery. Patent anterior and middle cerebral arteries. No large vessel occlusion, significant stenosis, contrast extravasation or aneurysm. POSTERIOR CIRCULATION: Patent vertebral arteries, vertebrobasilar junction and basilar artery, as well as main branch vessels. Moderate calcific atherosclerosis RIGHT V4 segment resulting in mild stenosis. Luminal irregularity LEFT V4 segment is unchanged, possible dissection or collateral vessels, moderate stenosis mid to distal LEFT V4 segment. Patent posterior cerebral arteries. Robust bilateral posterior communicating arteries. Mild stenosis RIGHT P1 segment. No large vessel occlusion, significant stenosis, contrast extravasation or aneurysm. VENOUS SINUSES: Major dural venous sinuses are  patent though not tailored for evaluation on this angiographic examination. ANATOMIC VARIANTS: None. DELAYED PHASE: Not performed. MIP images reviewed. IMPRESSION: CTA neck: 1. No hemodynamically significant stenosis or acute vascular process. 2. 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 3. Similarly occluded proximal LEFT vertebral artery with reconstitution. 4. Moderate to severe canal stenosis C4-5. Severe neural foraminal narrowing C3-4 and C4-5. CTA head: 1. No emergent large vessel occlusion or flow-limiting stenosis. 2. Moderate stenosis LEFT V4 segment. Aortic Atherosclerosis (ICD10-I70.0). Critical Value/emergent results text paged to [place] via AMION secure system on 07/19/2017 at 3:37 pm, including interpreting physician's phone number. Electronically Signed   By: Elon Alas M.D.   On: 07/19/2017 15:49   Mr Brain Wo Contrast  Result Date: 07/20/2017 CLINICAL DATA:  Initial evaluation for sudden onset dizziness, unsteady gait. EXAM: MRI HEAD WITHOUT CONTRAST TECHNIQUE: Multiplanar, multiecho pulse sequences of the brain and surrounding structures were obtained without intravenous contrast. COMPARISON:  Prior CTA from earlier the same day. FINDINGS: Brain: Generalized age-related cerebral atrophy. Patchy and confluent T2/FLAIR hyperintensity within the periventricular deep white matter both cerebral hemispheres, moderate to advanced in nature. Remote left cerebellar infarct with trace chronic hemosiderin staining. No abnormal foci of restricted diffusion to suggest acute or subacute ischemia. Gray-white matter differentiation maintained. No other areas of remote cortical infarction. No acute or chronic intracranial hemorrhage. No mass lesion, midline shift or mass effect. No hydrocephalus. No extra-axial fluid collection. Major dural sinuses grossly patent. Pituitary gland within normal limits. Vascular: Major intracranial vascular flow voids maintained.  Left vertebral artery hypoplastic and not well visualized. Skull and upper cervical spine: Craniocervical junction within normal limits. Degenerative thickening at the tectorial membrane without significant stenosis. Moderate cervical spondylolysis at C3-4 and C4-5 with resultant mild to moderate spinal stenosis. Bone marrow signal intensity within normal limits. No focal marrow replacing lesion. Scalp soft tissues within normal limits.  Sinuses/Orbits: Globes and orbital soft tissues within normal limits. Patient status post lens extraction bilaterally. Moderate mucosal thickening within the right maxillary sinus. Mild mucosal thickening within the right sphenoid sinus. Paranasal sinuses are otherwise largely clear. Small bilateral mastoid effusions, likely sterile/benign. Inner ear structures grossly normal. Other: None. IMPRESSION: 1. No acute intracranial abnormality. 2. 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 Electronically Signed   By: Jeannine Boga M.D.   On: 07/20/2017 00:01   Ct Head Code Stroke Wo Contrast  Addendum Date: 07/19/2017   ADDENDUM REPORT: 07/19/2017 15:37 ADDENDUM: Critical Value/emergent results text paged to [place], Neurology via [employer]ION secure system on 07/19/2017 at 3:32 pm, including interpreting physician's phone number. Electronically Signed   By: Elon Alas M.D.   On: 07/19/2017 15:37   Result Date: 07/19/2017 CLINICAL DATA:  Code stroke. Dizzy and nausea for an hour. History of stroke, hypertension, diabetes, carotid artery disease, pituitary tumor. EXAM: CT HEAD WITHOUT CONTRAST TECHNIQUE: Contiguous axial images were obtained from the base of the skull through the vertex without intravenous contrast. COMPARISON:  CT HEAD Jul 03, 2016. FINDINGS: BRAIN: No intraparenchymal hemorrhage, mass effect nor midline shift. The ventricles and sulci are normal for age. Patchy to confluent supratentorial white matter  hypodensities. Old LEFT cerebellar infarct. No acute large vascular territory infarcts. No abnormal extra-axial fluid collections. Basal cisterns are patent. VASCULAR: Moderate calcific atherosclerosis of the carotid siphons. No abnormally dense vessel. SKULL: No skull fracture. 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 No significant scalp soft tissue swelling. SINUSES/ORBITS: Partially imaged inspissated RIGHT maxillary sinusitis. Included ocular globes and orbital contents are non-suspicious. Status post bilateral ocular lens implants. OTHER: None. ASPECTS [facility] Stroke Program Early CT Score) - Ganglionic level infarction (caudate, lentiform nuclei, internal capsule, insula, M1-M3 cortex): 7 - Supraganglionic infarction (M4-M6 cortex): 3 Total score (0-10 with 10 being normal): 10 IMPRESSION: 1. No acute intracranial process. 2. ASPECTS is 10. 3. 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 Electronically Signed: By: Elon Alas M.D. On: 07/19/2017 15:34    Assessment/Plan: Diagnosis: TIA with history of MG Labs and images independently reviewed.  Records reviewed and summated above.  1. Does the need for close, 24 hr/day medical supervision in concert with the patient's rehab needs make it unreasonable for this patient to be served in a less intensive setting? Potentially  2. Co-Morbidities requiring supervision/potential complications: T2 DM (Monitor in accordance with exercise and adjust meds as necessary), MG (cont meds), history of PE, PAD (cont meds), CKD (avoid nephrotoxic meds), CML, history of cerebellar stroke, HTN (monitor and provide prns in accordance with increased physical exertion and pain), ABLA (transfuse if necessary to ensure appropriate perfusion for increased activity tolerance), Thrombocytopenia (< 60,000/mm3 no resistive exercise) 3. Due to safety, medication administration and patient education, does the  patient require 24 hr/day rehab nursing? Potentially 4. Does the patient require coordinated care of a physician, rehab nurse, PT (1-2 hrs/day, 5 days/week) and OT (1-2 hrs/day, 5 days/week) to address physical and functional deficits in the context of the above medical diagnosis(es)? Potentially Addressing deficits in the following areas: balance, endurance, locomotion, strength, bathing, dressing, toileting and psychosocial support 5. Can the patient actively participate in an intensive therapy program of at least 3 hrs of therapy per day at least 5 days per week? Yes 6. The potential for patient to make measurable gains while on inpatient rehab is excellent 7. Anticipated functional  outcomes upon discharge from inpatient rehab are supervision  with PT, supervision with OT, n/a with SLP. 8. Estimated rehab length of stay to reach the above functional goals is: 7-10 days. 9. Anticipated D/C setting: Home 10. Anticipated post D/C treatments: HH therapy and Home excercise program 11. Overall Rehab/Functional Prognosis: good  RECOMMENDATIONS: This patient's condition is appropriate for continued rehabilitative care in the following setting: Given diagnosis, anticipate patient with continue to make gradual improvements with time.  Recommend home with [facility] and caregiver support.  If pt does not continue to progress, recommend CIR. Patient has agreed to participate in recommended program. Potentially Note that insurance prior authorization may be required for reimbursement for recommended care.  Comment: Rehab Admissions Coordinator to follow up.   I have personally performed a face to face diagnostic evaluation, including, but not limited to relevant history and physical exam findings, of this patient and developed relevant assessment and plan.  Additionally, I have reviewed and concur with the physician assistant's documentation above.   Delice Lesch, MD, ABPMR Bary Leriche, PA-C 07/21/2017

## 2017-07-21 NOTE — Progress Notes (Signed)
Physical Therapy Treatment Patient Details Name: [name] MRN: [id number] DOB: 1940-05-23 Today's Date: 07/21/2017    History of Present Illness 77 yo male admitted with POV MRI (-) CT (-) for cva workup. Pt positive for dizziness. PMH: CAD Cerebellar CVA HTN DM (multiple admissions in 07/2017)     PT Comments    Patient seen for PT ambulation in conjunction with Neuro MD this session. Patient with some improvements noted in body tremor and only had 2 episodes of paused movements today. Unfortunately, balance deficits remains of concern as patient continues to demonstrate poor ability to maintain upright during ambulation. Continues to be a high fall risk with all mobility tasks. Given current presentation, do not feel patient is safe for d/c home at this time. Will update recommendations to post acute rehabilitation although patient desires to return home.  Feel patient would benefit from comprehensive therapies to ensure safety upon discharge. Will continue to see as indicated.  Follow Up Recommendations  Supervision/Assistance - 24 hour(Post Acute Rehabilitation CIR vs SNF)     Equipment Recommendations  (TBD)    Recommendations for Other Services       Precautions / Restrictions Precautions Precautions: Fall Precaution Comments: L LE buckling    Mobility  Bed Mobility Overal bed mobility: Needs Assistance Bed Mobility: Rolling;Supine to Sit;Sit to Supine Rolling: Supervision         General bed mobility comments: Supervision for safety  Transfers Overall transfer level: Needs assistance Equipment used: 1 person hand held assist Transfers: Sit to/from Stand Sit to Stand: Min assist         General transfer comment: Patient with heavy posterior list when attempting to rise to standing, required physical assist to perform transfer with multiple attempts to initiate power up  Ambulation/Gait Ambulation/Gait assistance: Min assist;Max assist(6 episodes of  LOB) Ambulation Distance (Feet): 130 Feet Assistive device: 1 person hand held assist Gait Pattern/deviations: Ataxic;Narrow base of support;Staggering right;Staggering left;Leaning posteriorly     General Gait Details: patient with several LOb noted during ambulation despite hands on assist with wrap around gait belt   Stairs             Wheelchair Mobility    Modified Rankin (Stroke Patients Only)       Balance Overall balance assessment: Needs assistance Sitting-balance support: Bilateral upper extremity supported;Feet supported Sitting balance-Leahy Scale: Fair Sitting balance - Comments: when tremor starts pt requires bil UE to keep him in static standing. pt with posterior lob with tremor   Standing balance support: Bilateral upper extremity supported;During functional activity Standing balance-Leahy Scale: Poor Standing balance comment: pt continues to realy on alternative assist for all aspects of standing.                            Cognition Arousal/Alertness: Awake/alert Behavior During Therapy: WFL for tasks assessed/performed Overall Cognitive Status: Impaired/Different from baseline Area of Impairment: Safety/judgement;Awareness                         Safety/Judgement: Decreased awareness of deficits;Decreased awareness of safety Awareness: Emergent   General Comments: decreased awareness of deficits thorughout session      Exercises      General Comments General comments (skin integrity, edema, etc.): pt with pause with transfers and stopping gait completely unable to advance. pt appears to lose the sequence to initiate the task.       Pertinent Vitals/Pain  Pain Assessment: No/denies pain    Home Living                      Prior Function            PT Goals (current goals can now be found in the care plan section) Acute Rehab PT Goals Patient Stated Goal: to go home PT Goal Formulation: With  patient/family Time For Goal Achievement: 08/03/17 Potential to Achieve Goals: Good Progress towards PT goals: Progressing toward goals    Frequency    Min 4X/week      PT Plan Current plan remains appropriate    Co-evaluation              AM-PAC PT "6 Clicks" Daily Activity  Outcome Measure  Difficulty turning over in bed (including adjusting bedclothes, sheets and blankets)?: A Little Difficulty moving from lying on back to sitting on the side of the bed? : A Little Difficulty sitting down on and standing up from a chair with arms (e.g., wheelchair, bedside commode, etc,.)?: Unable Help needed moving to and from a bed to chair (including a wheelchair)?: A Lot Help needed walking in hospital room?: A Lot Help needed climbing 3-5 steps with a railing? : A Lot 6 Click Score: 13    End of Session Equipment Utilized During Treatment: Gait belt Activity Tolerance: Patient tolerated treatment well Patient left: in bed;with call bell/phone within reach;with bed alarm set;with family/visitor present Nurse Communication: Mobility status PT Visit Diagnosis: Difficulty in walking, not elsewhere classified (R26.2)     Time: 6213-0865 PT Time Calculation (min) (ACUTE ONLY): 13 min  Charges:  $Gait Training: 8-22 mins                    G Codes:       Alben Deeds, PT DPT  Board Certified Neurologic Specialist [place] 07/21/2017, 11:19 AM

## 2017-07-21 NOTE — Evaluation (Signed)
Speech Language Pathology Evaluation Patient Details Name: [name] MRN: [id number] DOB: 1940-09-08 Today's Date: 07/21/2017 Time: 8242-3536 SLP Time Calculation (min) (ACUTE ONLY): 32 min  Problem List:  Patient Active Problem List   Diagnosis Date Noted  . Dizziness 07/19/2017  . Stroke-like symptoms 07/19/2017  . Wheezing 06/17/2017  . Bursitis of left elbow 05/01/2017  . Dystonia 12/29/2016  . PAD (peripheral artery disease) ([place]) 11/18/2016  . Osteoporosis 08/28/2016  . Paronychia of great toe, left 08/10/2016  . Carotid arterial disease ([place]) 07/09/2016  . Hypocalcemia 07/09/2016  . Diplopia 07/02/2016  . S/P lumbar laminectomy 03/11/2016  . Radicular pain of left lower extremity 02/21/2016  . Cough 01/10/2016  . 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 Myasthenia gravis ([place])   . Diabetes mellitus type 2 in nonobese (HCC)   . Coronary artery disease involving native coronary artery of native heart without angina pectoris   . Acute blood loss anemia   . Ataxia, post-stroke   . Gait disturbance, post-stroke   . Proximal leg weakness   . Cervical spondylosis without myelopathy   . Achilles tendinitis of right lower extremity   . Chronic diastolic CHF (congestive heart failure) ([place])   . Gastroesophageal reflux disease without esophagitis   . Chronic kidney disease   . Cerebral infarction involving left cerebellar artery ([place]) 06/20/2015  . Cerebellar stroke ([place])   . Ischemic stroke ([place])   . Cerebrovascular accident (CVA) due to thrombosis of left vertebral artery ([place])   . History of pulmonary embolus (PE)   . Chronic anticoagulation   . HLD (hyperlipidemia)   . Neck pain 03/19/2015  . Renal insufficiency 03/13/2015  . Pain in the chest   . 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[place])   . Chest pain syndrome 03/07/2015  . Chronic diastolic heart failure, NYHA class 1 ([place]) 03/07/2015  . Diabetic gastroparesis ([place])  03/07/2015  . Screening for osteoporosis 07/19/2014  . Edema 04/09/2014  . Routine general medical examination at a health care facility 10/23/2013  . Bilateral carotid bruits 05/24/2013  . Abnormal CT scan, lung 05/24/2013  . Encounter for therapeutic drug monitoring 03/14/2013  . Lymphadenopathy, inguinal 01/11/2013  . AKI (acute kidney injury) ([place]) 10/28/2012  . 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 Myasthenia gravis with exacerbation ([place]) 03/31/2012  . Disease of salivary gland 12/09/2011  . Left facial pain 10/28/2011  . Pulmonary embolism ([facility]) 10/25/2011  . 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 GERD with stricture 09/23/2011  . Long term (current) use of anticoagulants 08/03/2011  . History of pulmonary embolism 07/30/2011  . History of DVT (deep vein thrombosis) 07/30/2011  . Subconjunctival hemorrhage 06/22/2011  . Bilateral conjunctivitis 04/09/2011  . Diabetes mellitus type 2, controlled ([place]) 12/18/2010  . Hearing loss 08/04/2010  . Ptosis of eyelid 12/30/2009  . Essential hypertension 04/24/2009  . HYPERLIPIDEMIA, MIXED 10/05/2008  . HEMORRHOIDS-INTERNAL 07/02/2008  . HEMORRHOIDS-EXTERNAL 07/02/2008  . DIVERTICULOSIS-COLON 07/02/2008  . PERSONAL HX COLONIC POLYPS 07/02/2008  . RASH-NONVESICULAR 02/07/2008  . PNEUMONIA 12/08/2007  . Chronic myeloid leukemia in remission ([place]) 07/07/2007  . CAD (coronary artery disease) 07/07/2007  . NEPHROLITHIASIS, HX OF 07/07/2007   Past Medical History:  Past Medical History:  Diagnosis Date  . Bruises easily    d/t being on Eliquis  . Carotid artery disease ([place])    a. Duplex 05/2014: 14-43% RICA, 1-54% LICA, elevated velocities in right subclavian artery, normal left subclavian artery.Marland Kitchen  Marland Kitchen  Chronic back pain    HNP  . Chronic kidney disease (CKD)   . Claustrophobia    takes Ativan as needed  . 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 Coronary atherosclerosis of unspecified type of  vessel, native or graft    a. cath in 2003 showed 10-20% LM, scattered 20% prox-mid LAD, 30% more distal LAD, 50-60% stenosis of LAD towards apex, 20% Cx, 30% dRCA, focal 95% stenosis in smaller of 2 branches of PDA, EF 60-65%.  . Diabetes mellitus ([place])   . Diarrhea    takes Imodium daily as needed  . Diverticulosis   . Diverticulosis of colon (without mention of hemorrhage)   . DJD (degenerative joint disease)   . DVT (deep venous thrombosis) ([place]) 07/2011  . Esophageal stricture   . Essential hypertension    takes Imdur and Lisinopril daily  . Gastric polyps    benign  . GERD (gastroesophageal reflux disease)    takes Nexium daily  . Glaucoma    uses eye drops  . History of bronchitis    not sure when the last time  . History of colon polyps    benign  . History of kidney stones   . History of kidney stones   . Insomnia    takes Melatonin nightly as needed  . Mixed hyperlipidemia    takes Crestor daily  . Myasthenia gravis ([place])    uses prednisone  . Osteoporosis 2016  . Peripheral edema    takes Lasix daily  . Pituitary tumor    checks [facility]Walden at [facility] checks it every Jan   . Pneumonia 01/2016  . PONV (postoperative nausea and vomiting)   . Ptosis   . Pulmonary embolus ([place]) 2012  . Stroke [facility]) 06/2015   Past Surgical History:  Past Surgical History:  Procedure Laterality Date  . ABDOMINAL AORTOGRAM W/LOWER EXTREMITY N/A 11/18/2016   Procedure: ABDOMINAL AORTOGRAM W/LOWER EXTREMITY;  Surgeon: [name], MD;  Location: [place] CV [facility];  Service: Cardiovascular;  Laterality: N/A;  . BACK SURGERY     x2, lumbar and cervical  . CARDIAC CATHETERIZATION    . CATARACT EXTRACTION Bilateral 12/12  . COLONOSCOPY    . ESOPHAGOGASTRODUODENOSCOPY ENDOSCOPY  04/2015  . IR GENERIC HISTORICAL  03/03/2016   IR IVC FILTER PLMT / S&I [name] GUID/MOD SED 03/03/2016 [name], MD [facility]-INTERV RAD  . IR GENERIC HISTORICAL  04/09/2016   IR RADIOLOGIST EVAL & MGMT  04/09/2016 [name], MD [facility] INTER[facility] RAD  . IR GENERIC HISTORICAL  04/30/2016   IR IVC FILTER RETRIEVAL / S&I [name] GUID/MOD SED 04/30/2016 [name], MD [facility]-INTERV RAD  . LITHOTRIPSY    . LUMBAR LAMINECTOMY/DECOMPRESSION MICRODISCECTOMY Right 09/14/2012   Procedure: Right Lumbar three-four, four-five, Lumbar five-Sacral one decompressive laminectomy;  Surgeon: [name], MD;  Location: [place] [facility]URO ORS;  Service: Neurosurgery;  Laterality: Right;  . LUMBAR LAMINECTOMY/DECOMPRESSION MICRODISCECTOMY Left 03/11/2016   Procedure: Left Lumbar Two-ThreeMicrodiscectomy;  Surgeon: [name], MD;  Location: [facility];  Service: Neurosurgery;  Laterality: Left;  . PERIPHERAL VASCULAR INTERVENTION  11/18/2016   Procedure: PERIPHERAL VASCULAR INTERVENTION;  Surgeon: [name], MD;  Location: [place] CV LAB;  Service: Cardiovascular;;  Left popliteal  . SHOULDER SURGERY Left 05/07/2009  . TONSILLECTOMY     HPI:  77 yo male admitted with dizziness, N/V. MRI (-) CT (-) for cva workup. Pt positive for dizziness. PMH: CAD Cerebellar CVA (2017) HTN DM (  multiple admissions in 07/2017) . Dx posterior circulation TIA/recrudescence of previous stroke symptoms.    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 Deficits were specific to cube copying, recall of digit spans forward and backward. Pt acknowledges increasing difficulty in recall.  Also notable were occasional periods of impaired verbal motor output, lasting a few seconds and happening twice during our thirty minute session.  Language and fluency were intact; pt was able to discuss cognitive deficits, demonstrated awareness of testing environment and external distractors.  Functional awareness during OT and PT session was  impaired per their reports.  Pt may benefit from further diagnostic investigation in next venue of care.  No further acute SLP needs are identified.    SLP Assessment  SLP Recommendation/Assessment: All further Speech Lanaguage Pathology  needs can be addressed in the next venue of care SLP Visit Diagnosis: Cognitive communication deficit (R41.841)    Follow Up Recommendations  Other (comment)(further evaluation next venue of care)    Frequency and Duration           SLP Evaluation Cognition  Overall Cognitive Status: Impaired/Different from baseline Arousal/Alertness: Awake/alert Orientation Level: Oriented X4 Attention: Alternating Alternating Attention: Appears intact Memory: Impaired Memory Impairment: Storage deficit;Retrieval deficit Awareness: Impaired Awareness Impairment: Emergent impairment Problem Solving: (tba) Safety/Judgment: Other (comment) Comments: answered questions appropriately per testing; functional deficits noted per OT/PT       Comprehension  Auditory Comprehension Overall Auditory Comprehension: Appears within functional limits for tasks assessed    Expression Expression Primary Mode of Expression: Verbal Verbal Expression Overall Verbal Expression: Appears within functional limits for tasks assessed   Oral / Motor  Oral Motor/Sensory Function Overall Oral Motor/Sensory Function: Within functional limits Motor Speech Overall Motor Speech: Appears within functional limits for tasks assessed   GO                    Juan Quam [place] 07/21/2017, 2:29 PM

## 2017-07-21 NOTE — Progress Notes (Signed)
STROKE TEAM PROGRESS NOTE      INTERVAL HISTORY His wife and multiple family members are  at the bedside.   He states his nausea and vomiting have improved but his dizziness and gait imbalancehave not. In fact did not do well when he got up with physical therapy and states that his legs gave out and was frozen. Subsequently made him get up again with physical therapy after rounds and noticed that he needed 1 person assist and was off balance and leaned to the left..  Vitals:   07/21/17 0417 07/21/17 0736 07/21/17 0940 07/21/17 1116  BP: (!) 165/64 130/71 (!) 173/70 140/60  Pulse: 63 60 69 (!) 59  Resp: 14 20  16   Temp: 98.3 F (36.8 C) 98.2 F (36.8 C) 98.3 F (36.8 C) 97.9 F (36.6 C)  TempSrc: Oral Oral Oral Oral  SpO2: 99% 97%  100%  Weight:      Height:        CBC:  Recent Labs  Lab 07/19/17 1457  07/20/17 0525 07/21/17 0431  WBC 9.1  --  7.2 6.2  NEUTROABS 6.4  --   --   --   HGB 12.5*   < > 11.2* 10.7*  HCT 37.3*   < > 34.1* 32.9*  MCV 98.2  --  98.0 100.0  PLT 188  --  139* 136*   < > = values in this interval not displayed.    Basic Metabolic Panel:  Recent Labs  Lab 07/20/17 0525 07/21/17 0431  NA 140 141  K 4.0 3.9  CL 107 109  CO2 27 23  GLUCOSE 88 93  BUN 11 10  CREATININE 1.55* 1.41*  CALCIUM 8.5* 8.3*  MG  --  1.8   Lipid Panel:     Component Value Date/Time   CHOL 134 07/20/2017 0525   TRIG 166 (H) 07/20/2017 0525   HDL 33 (L) 07/20/2017 0525   CHOLHDL 4.1 07/20/2017 0525   VLDL 33 07/20/2017 0525   LDLCALC 68 07/20/2017 0525   HgbA1c:  Lab Results  Component Value Date   HGBA1C 6.1 (H) 07/20/2017   Urine Drug Screen:     Component Value Date/Time   LABOPIA NONE DETECTED 07/19/2017 1734   COCAINSCRNUR NONE DETECTED 07/19/2017 1734   LABBENZ NONE DETECTED 07/19/2017 1734   AMPHETMU NONE DETECTED 07/19/2017 1734   THCU NONE DETECTED 07/19/2017 1734   LABBARB NONE DETECTED 07/19/2017 1734    Alcohol Level No results found for:  ETH  IMAGING Ct Angio Head W Or Wo Contrast  Result Date: 07/19/2017 CLINICAL DATA:  Follow up code stroke. History of hypertension, carotid artery disease. EXAM: CT ANGIOGRAPHY HEAD AND NECK TECHNIQUE: Multidetector CT imaging of the head and neck was performed using the standard protocol during bolus administration of intravenous contrast. Multiplanar CT image reconstructions and MIPs were obtained to evaluate the vascular anatomy. Carotid stenosis measurements (when applicable) are obtained utilizing NASCET criteria, using the distal internal carotid diameter as the denominator. CONTRAST:  163mL ISOVUE-370 IOPAMIDOL (ISOVUE-370) INJECTION 76% COMPARISON:  CT HEAD July 19, 2017 and CT angiogram head and neck Jul 03, 2016. FINDINGS: CTA NECK FINDINGS: AORTIC ARCH: Normal appearance of the thoracic arch, normal branch pattern. Mild calcific atherosclerosis aortic arch. The origins of the innominate, left Common carotid artery and subclavian artery are widely patent. RIGHT CAROTID SYSTEM: Common carotid artery is patent. Severe calcific atherosclerosis and intimal thickening with similarly ulcerated RIGHT internal carotid artery origin versus old dissection. No hemodynamically  significant stenosis by NASCET criteria. Patent internal carotid artery. LEFT CAROTID SYSTEM: Common carotid artery is patent. Mild calcific atherosclerosis of the carotid bifurcation without hemodynamically significant stenosis by NASCET criteria. Normal appearance of the internal carotid artery. VERTEBRAL ARTERIES:RIGHT vertebral artery is dominant. Moderate stenosis LEFT vertebral artery origin. Similarly attenuated and intermittently occluded LEFT vertebral artery with tandem reconstitution via muscular cutaneous branches. SKELETON: No acute osseous process though bone windows have not been submitted. Moderate canal stenosis C3-4, moderate to severe canal stenosis C4-5, moderate at C6-7. Moderate LEFT C2-3, severe bilateral C3-4,  severe bilateral C4-5 neural foraminal narrowing. OTHER NECK: Soft tissues of the neck are nonacute though, not tailored for evaluation. UPPER CHEST: Included lung apices are clear. No superior mediastinal lymphadenopathy. CTA HEAD FINDINGS: ANTERIOR CIRCULATION: Patent cervical internal carotid arteries, petrous, cavernous and supra clinoid internal carotid arteries. Patent anterior communicating artery. Patent anterior and middle cerebral arteries. No large vessel occlusion, significant stenosis, contrast extravasation or aneurysm. POSTERIOR CIRCULATION: Patent vertebral arteries, vertebrobasilar junction and basilar artery, as well as main branch vessels. Moderate calcific atherosclerosis RIGHT V4 segment resulting in mild stenosis. Luminal irregularity LEFT V4 segment is unchanged, possible dissection or collateral vessels, moderate stenosis mid to distal LEFT V4 segment. Patent posterior cerebral arteries. Robust bilateral posterior communicating arteries. Mild stenosis RIGHT P1 segment. No large vessel occlusion, significant stenosis, contrast extravasation or aneurysm. VENOUS SINUSES: Major dural venous sinuses are patent though not tailored for evaluation on this angiographic examination. ANATOMIC VARIANTS: None. DELAYED PHASE: Not performed. MIP images reviewed. IMPRESSION: CTA neck: 1. No hemodynamically significant stenosis or acute vascular process. 2. 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 3. Similarly occluded proximal LEFT vertebral artery with reconstitution. 4. Moderate to severe canal stenosis C4-5. Severe neural foraminal narrowing C3-4 and C4-5. CTA head: 1. No emergent large vessel occlusion or flow-limiting stenosis. 2. Moderate stenosis LEFT V4 segment. Aortic Atherosclerosis (ICD10-I70.0). Critical Value/emergent results text paged to [place] via AMION secure system on 07/19/2017 at 3:37 pm, including interpreting physician's phone number.  Electronically Signed   By: Elon Alas M.D.   On: 07/19/2017 15:49   Ct Angio Neck W Or Wo Contrast  Result Date: 07/19/2017 CLINICAL DATA:  Follow up code stroke. History of hypertension, carotid artery disease. EXAM: CT ANGIOGRAPHY HEAD AND NECK TECHNIQUE: Multidetector CT imaging of the head and neck was performed using the standard protocol during bolus administration of intravenous contrast. Multiplanar CT image reconstructions and MIPs were obtained to evaluate the vascular anatomy. Carotid stenosis measurements (when applicable) are obtained utilizing NASCET criteria, using the distal internal carotid diameter as the denominator. CONTRAST:  130mL ISOVUE-370 IOPAMIDOL (ISOVUE-370) INJECTION 76% COMPARISON:  CT HEAD July 19, 2017 and CT angiogram head and neck Jul 03, 2016. FINDINGS: CTA NECK FINDINGS: AORTIC ARCH: Normal appearance of the thoracic arch, normal branch pattern. Mild calcific atherosclerosis aortic arch. The origins of the innominate, left Common carotid artery and subclavian artery are widely patent. RIGHT CAROTID SYSTEM: Common carotid artery is patent. Severe calcific atherosclerosis and intimal thickening with similarly ulcerated RIGHT internal carotid artery origin versus old dissection. No hemodynamically significant stenosis by NASCET criteria. Patent internal carotid artery. LEFT CAROTID SYSTEM: Common carotid artery is patent. Mild calcific atherosclerosis of the carotid bifurcation without hemodynamically significant stenosis by NASCET criteria. Normal appearance of the internal carotid artery. VERTEBRAL ARTERIES:RIGHT vertebral artery is dominant. Moderate stenosis LEFT vertebral artery origin. Similarly attenuated and intermittently occluded LEFT vertebral artery with tandem reconstitution via muscular cutaneous  branches. SKELETON: No acute osseous process though bone windows have not been submitted. Moderate canal stenosis C3-4, moderate to severe canal stenosis C4-5,  moderate at C6-7. Moderate LEFT C2-3, severe bilateral C3-4, severe bilateral C4-5 neural foraminal narrowing. OTHER NECK: Soft tissues of the neck are nonacute though, not tailored for evaluation. UPPER CHEST: Included lung apices are clear. No superior mediastinal lymphadenopathy. CTA HEAD FINDINGS: ANTERIOR CIRCULATION: Patent cervical internal carotid arteries, petrous, cavernous and supra clinoid internal carotid arteries. Patent anterior communicating artery. Patent anterior and middle cerebral arteries. No large vessel occlusion, significant stenosis, contrast extravasation or aneurysm. POSTERIOR CIRCULATION: Patent vertebral arteries, vertebrobasilar junction and basilar artery, as well as main branch vessels. Moderate calcific atherosclerosis RIGHT V4 segment resulting in mild stenosis. Luminal irregularity LEFT V4 segment is unchanged, possible dissection or collateral vessels, moderate stenosis mid to distal LEFT V4 segment. Patent posterior cerebral arteries. Robust bilateral posterior communicating arteries. Mild stenosis RIGHT P1 segment. No large vessel occlusion, significant stenosis, contrast extravasation or aneurysm. VENOUS SINUSES: Major dural venous sinuses are patent though not tailored for evaluation on this angiographic examination. ANATOMIC VARIANTS: None. DELAYED PHASE: Not performed. MIP images reviewed. IMPRESSION: CTA neck: 1. No hemodynamically significant stenosis or acute vascular process. 2. 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 3. Similarly occluded proximal LEFT vertebral artery with reconstitution. 4. Moderate to severe canal stenosis C4-5. Severe neural foraminal narrowing C3-4 and C4-5. CTA head: 1. No emergent large vessel occlusion or flow-limiting stenosis. 2. Moderate stenosis LEFT V4 segment. Aortic Atherosclerosis (ICD10-I70.0). Critical Value/emergent results text paged to [place] via AMION secure system on 07/19/2017 at 3:37  pm, including interpreting physician's phone number. Electronically Signed   By: Elon Alas M.D.   On: 07/19/2017 15:49   Mr Brain Wo Contrast  Result Date: 07/20/2017 CLINICAL DATA:  Initial evaluation for sudden onset dizziness, unsteady gait. EXAM: MRI HEAD WITHOUT CONTRAST TECHNIQUE: Multiplanar, multiecho pulse sequences of the brain and surrounding structures were obtained without intravenous contrast. COMPARISON:  Prior CTA from earlier the same day. FINDINGS: Brain: Generalized age-related cerebral atrophy. Patchy and confluent T2/FLAIR hyperintensity within the periventricular deep white matter both cerebral hemispheres, moderate to advanced in nature. Remote left cerebellar infarct with trace chronic hemosiderin staining. No abnormal foci of restricted diffusion to suggest acute or subacute ischemia. Gray-white matter differentiation maintained. No other areas of remote cortical infarction. No acute or chronic intracranial hemorrhage. No mass lesion, midline shift or mass effect. No hydrocephalus. No extra-axial fluid collection. Major dural sinuses grossly patent. Pituitary gland within normal limits. Vascular: Major intracranial vascular flow voids maintained. Left vertebral artery hypoplastic and not well visualized. Skull and upper cervical spine: Craniocervical junction within normal limits. Degenerative thickening at the tectorial membrane without significant stenosis. Moderate cervical spondylolysis at C3-4 and C4-5 with resultant mild to moderate spinal stenosis. Bone marrow signal intensity within normal limits. No focal marrow replacing lesion. Scalp soft tissues within normal limits. Sinuses/Orbits: Globes and orbital soft tissues within normal limits. Patient status post lens extraction bilaterally. Moderate mucosal thickening within the right maxillary sinus. Mild mucosal thickening within the right sphenoid sinus. Paranasal sinuses are otherwise largely clear. Small bilateral  mastoid effusions, likely sterile/benign. Inner ear structures grossly normal. Other: None. IMPRESSION: 1. No acute intracranial abnormality. 2. 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 Electronically Signed   By: Jeannine Boga M.D.   On: 07/20/2017 00:01   Ct Head Code Stroke Wo Contrast  Addendum  Date: 07/19/2017   ADDENDUM REPORT: 07/19/2017 15:37 ADDENDUM: Critical Value/emergent results text paged to [place], Neurology via AMION secure system on 07/19/2017 at 3:32 pm, including interpreting physician's phone number. Electronically Signed   By: Elon Alas M.D.   On: 07/19/2017 15:37   Result Date: 07/19/2017 CLINICAL DATA:  Code stroke. Dizzy and nausea for an hour. History of stroke, hypertension, diabetes, carotid artery disease, pituitary tumor. EXAM: CT HEAD WITHOUT CONTRAST TECHNIQUE: Contiguous axial images were obtained from the base of the skull through the vertex without intravenous contrast. COMPARISON:  CT HEAD Jul 03, 2016. FINDINGS: BRAIN: No intraparenchymal hemorrhage, mass effect nor midline shift. The ventricles and sulci are normal for age. Patchy to confluent supratentorial white matter hypodensities. Old LEFT cerebellar infarct. No acute large vascular territory infarcts. No abnormal extra-axial fluid collections. Basal cisterns are patent. VASCULAR: Moderate calcific atherosclerosis of the carotid siphons. No abnormally dense vessel. SKULL: No skull fracture. 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 No significant scalp soft tissue swelling. SINUSES/ORBITS: Partially imaged inspissated RIGHT maxillary sinusitis. Included ocular globes and orbital contents are non-suspicious. Status post bilateral ocular lens implants. OTHER: None. ASPECTS [facility] Stroke Program Early CT Score) - Ganglionic level infarction (caudate, lentiform nuclei, internal capsule, insula, M1-M3 cortex): 7 - Supraganglionic  infarction (M4-M6 cortex): 3 Total score (0-10 with 10 being normal): 10 IMPRESSION: 1. No acute intracranial process. 2. ASPECTS is 10. 3. 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 Electronically Signed: By: Elon Alas M.D. On: 07/19/2017 15:34   2D Echocardiogram  - Left ventricle: The cavity size was normal. There was mild concentric hypertrophy. Systolic function was normal. The estimated ejection fraction was in the range of 50% to 55%. Wall motion was normal; there were no regional wall motion abnormalities. Doppler parameters are consistent with abnormal left ventricular relaxation (grade 1 diastolic dysfunction). Doppler parameters are consistent with elevated ventricular end-diastolic filling pressure. - Aortic valve: There was mild stenosis. There was trivial regurgitation. Valve area (VTI): 2.08 cm^2. Valve area (Vmax): 1.86 cm^2. Valve area (Vmean): 1.81 cm^2. - Mitral valve: There was mild regurgitation. - Right ventricle: The cavity size was normal. Wall thickness was normal. Systolic function was normal. - Right atrium: The atrium was normal in size. - Pulmonary arteries: Systolic pressure was within the normal range. - Inferior vena cava: The vessel was normal in size. - Pericardium, extracardiac: There was no pericardial effusion. Impressions:   No cardiac source of emboli was indentified.   PHYSICAL EXAM  Pleasant elderly Caucasian male currently not in distress. HEENT-  Normocephalic, no lesions, without obvious abnormality.  Normal external eye and conjunctiva.   Musculoskeletal-no joint tenderness, deformity or swelling Skin-warm and dry  Neurological Examination Mental Status: Alert, oriented, thought content appropriate.  Speech fluent without evidence of aphasia.  Able to follow 3 step commands without difficulty. Cranial Nerves: II:  Visual fields grossly normal,  III,IV, VI: ptosis not present, extra-ocular  motions intact bilaterally, pupils equal, round, reactive to light and accommodation- no nystagmus but saccadic dysmetria when he looks to the right and full range of eye movements. Does complain of subjective dizziness when he sits up or moves suddenly. V,VII: smile symmetric--patient does not have any facial droop when smiling, facial light touch sensation normal bilaterally VIII: hearing normal bilaterally IX,X: uvula rises symmetrically XI: bilateral shoulder shrug XII: midline tongue extension Motor: Right :  Upper extremity   5/5  Left:     Upper extremity   5/5             Lower extremity   5/5                                                  Lower extremity   5/5 Tone and bulk:normal tone throughout; no atrophy noted Sensory: Pinprick and light touch intact throughout, bilaterally Deep Tendon Reflexes: 2+ and symmetric throughout Plantars: Right: downgoing                                Left: downgoing recrudescence of previous Cerebellar: normal finger-to-nose --significant postural tremor bilaterally in the arms.  Mild dysmetria on the right however this may be due to his tremor. Gait: significant truncal ataxia with leaning to the right  ASSESSMENT/PLAN [name] is a 77 y.o. 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   Posterior circulationTIA / recrudescence of previous stroke symptoms  Resultant  Nausea, vomiting ,dizziness and gait ataxia  Code Stroke CT head No acute stroke.  Moderate to severe small vessel disease.  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  ASPECTS 10.     CTA head no ELVO.  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  Moderate to severe canal stenosis C4-5 with severe neuroforaminal narrowing C3-4 and C4-5  MRI  No acute stroke.  Old left cerebellar infarct.  Moderate to advanced small vessel  disease.  2D Echo  EF 50-55%. No source of embolus   LDL 68  HgbA1c 6.1  Eliquis for VTE prophylaxis  Eliquis (apixaban) daily prior to admission, now on Eliquis (apixaban) daily.  Continue Eliquis at discharge  Therapy recommendations:  OP OT  Disposition:  Return home with wife  Ongoing symptomatic control.  Expect it will improve.  Hx DVT, PE  On chronic anticoagulation.   Was on coumadin until stroke in 2017 when he was changed to Eliquis  Hypertension  Stable . BP goal normotensive  Hyperlipidemia  Home meds:  crestor 10, changed to lipitor 80 on admission to hospital  LDL 68, goal < 70  Continue Crestor 10 mg, Continue at discharge. Orders adjusted  Other Stroke Risk Factors  Advanced age  Hx stroke/TIA  06/2015 - left PICA and cerebellar vermis infarcts embolic pattern, secondary to unknown source. Etiology could be cardioembolic as pt has MAT on exam with potential afib vs. 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 Continue eliquis for long-term anticoagulation given history of pulmonary embolism and DVT. Maintain adequate hydration and avoid hypotension.e is still ataxic with his  gait and likely needs to stay longer and get physical therapy possibly at home or even in inpatient setting. Long discussion with the patient and wife at the bedside and answered  questions. Discussed with physical therapist  Greater than 50% and in this 25 minute visit was spent on counseling and coordination of care about his TIA and answered questions  Antony Contras, [place] Pager: [phone] 07/21/2017 1:04 PM  To contact Stroke Continuity provider, please refer to [email or web address] After hours, contact General Neurology

## 2017-07-21 NOTE — Progress Notes (Signed)
Occupational Therapy Treatment Patient Details Name: [name] MRN: [id number] DOB: 01-08-1941 Today's Date: 07/21/2017    History of present illness 77 yo male admitted with POV MRI (-) CT (-) for cva workup. Pt positive for dizziness. PMH: CAD Cerebellar CVA HTN DM (multiple admissions in 07/2017)    OT comments  Pt demonstrates high fall risk and posterior LOB with bracing against surfaces during all transfers. Pt now with LLE buckle x2 during session requiring mod(A) from therapist to prevent fall. Pt with poor safety awareness and seems unaware of fall risk. Wife states "he is worse. This isnt normal. He can't go home like this." Pt with ataxic R UE movement this session. Recommending rehab based on this session. Pt with tremor and posterior lob present again this session. Pt is high fall risk for all adls.    Follow Up Recommendations  SNF(outpatient if balance improves to [facility])    Equipment Recommendations  Other (comment)(RW)    Recommendations for Other Services PT consult    Precautions / Restrictions Precautions Precautions: Fall Precaution Comments: L LE buckling       Mobility Bed Mobility Overal bed mobility: Needs Assistance   Rolling: Supervision            Transfers Overall transfer level: Needs assistance Equipment used: Rolling walker (2 wheeled) Transfers: Sit to/from Stand Sit to Stand: Min assist         General transfer comment: pt able to power up but immediately LOB posterior. pt with bil le braced on bed surface    Balance Overall balance assessment: Needs assistance Sitting-balance support: Bilateral upper extremity supported;Feet supported Sitting balance-Leahy Scale: Fair Sitting balance - Comments: when tremor starts pt requires bil UE to keep him in static standing. pt with posterior lob with tremor   Standing balance support: Bilateral upper extremity supported;During functional activity Standing balance-Leahy Scale:  Poor Standing balance comment: relies on RW and no self correction with posterior lob                           ADL either performed or assessed with clinical judgement   ADL Overall ADL's : Needs assistance/impaired     Grooming: Moderate assistance;Standing;Oral care;Wash/dry face;Wash/dry hands Grooming Details (indicate cue type and reason): pt with posterior lean. pt noted to have toes off the ground and posterior lean without awareness. pt does not attempt to self correct until therapist is already aiding with phsycial assistance. pt bracing on all surfaces with sit<>Stand transfer. pt does not recognize this and actually states "i think i am stronger and better today"                  Toilet Transfer: Minimal assistance;RW;BSC Toilet Transfer Details (indicate cue type and reason): pt with less (A) requires with bathroom transfer with use of RW and but patient demonstrates safety errors with RW . pt walking outside of the frame of the RW. pt pushing RW to the side and attempting to reach to the lateral side of RW. pt needs mod cues for RW safety. pt again demonstrates posterior LOB with correction by therapist. pt attempting to exit the bathroom without (A).          Functional mobility during ADLs: Moderate assistance General ADL Comments: pt eager to get OOB and reports great night sleep. pt complete adl task at the sink demonstrating posterior LOB and decr fine/ gross motor . pt with tremor incr in  intensity with movement for adl task. pt with lack of awareness. OT transfering patient without RW to assess balance further this session and bil LE buckle once and x2 LLE buckle. pt seems not alarmed by the event and reports feeling that he is better at this time.      Vision   Additional Comments: wife reports "he couldnt read the paper this morning. " when asked more questions pt reports "i am too dizzy"   Perception     Praxis      Cognition Arousal/Alertness:  Awake/alert Behavior During Therapy: WFL for tasks assessed/performed Overall Cognitive Status: Impaired/Different from baseline Area of Impairment: Safety/judgement;Awareness                         Safety/Judgement: Decreased awareness of deficits;Decreased awareness of safety Awareness: Emergent   General Comments: pt with L LE bucklin and demonstrates lack of awareness         Exercises     Shoulder Instructions       General Comments pt with pause with transfers and stopping gait completely unable to advance. pt appears to lose the sequence to initiate the task.     Pertinent Vitals/ Pain       Pain Assessment: No/denies pain  Home Living                                          Prior Functioning/Environment              Frequency  Min 3X/week        Progress Toward Goals  OT Goals(current goals can now be found in the care plan section)  Progress towards OT goals: Progressing toward goals  Acute Rehab OT Goals Patient Stated Goal: to go home OT Goal Formulation: With patient/family Time For Goal Achievement: 08/03/17 Potential to Achieve Goals: Good ADL Goals Pt Will Perform Grooming: with modified independence;sitting Pt Will Perform Upper Body Bathing: with modified independence;sitting Pt Will Perform Lower Body Bathing: with modified independence;sit to/from stand Pt Will Transfer to Toilet: with modified independence;bedside commode Additional ADL Goal #1: Pt will complete DGI with score > 19 with RW  Plan Discharge plan remains appropriate    Co-evaluation                 AM-PAC PT "6 Clicks" Daily Activity     Outcome Measure   Help from another person eating meals?: A Little Help from another person taking care of personal grooming?: A Little Help from another person toileting, which includes using toliet, bedpan, or urinal?: A Little Help from another person bathing (including washing, rinsing,  drying)?: A Lot Help from another person to put on and taking off regular upper body clothing?: A Little Help from another person to put on and taking off regular lower body clothing?: A Lot 6 Click Score: 16    End of Session Equipment Utilized During Treatment: Gait belt;Rolling walker  OT Visit Diagnosis: Unsteadiness on feet (R26.81);Muscle weakness (generalized) (M62.81)   Activity Tolerance Patient tolerated treatment well   Patient Left in bed;with call bell/phone within reach;with family/visitor present;with nursing/sitter in room   Nurse Communication Mobility status;Precautions        Time: 4696-2952 OT Time Calculation (min): 20 min  Charges: OT General Charges $OT Visit: 1 Visit OT Treatments $Self Care/Home Management : 8-22 mins  Jeri Modena   OTR/L Pager: [phone] Office: [phone] .    Parke Poisson B 07/21/2017, 9:43 AM

## 2017-07-21 NOTE — Progress Notes (Signed)
Rehab Admissions Coordinator Note:  Patient was screened by Cleatrice Burke for appropriateness for an Inpatient Acute Rehab Consult per PT change in recommendation and needed post acute rehab SNF vs CIR at this time.  At this time, we are recommending Inpatient Rehab consult.  Cleatrice Burke 07/21/2017, 1:26 PM  I can be reached at [phone].

## 2017-07-22 LAB — GLUCOSE, CAPILLARY
GLUCOSE-CAPILLARY: 118 mg/dL — AB (ref 65–99)
GLUCOSE-CAPILLARY: 132 mg/dL — AB (ref 65–99)
GLUCOSE-CAPILLARY: 206 mg/dL — AB (ref 65–99)
Glucose-Capillary: 116 mg/dL — ABNORMAL HIGH (ref 65–99)

## 2017-07-22 NOTE — Progress Notes (Addendum)
Physical Therapy Treatment Patient Details Name: [name] MRN: [id number] DOB: April 30, 1940 Today's Date: 07/22/2017    History of Present Illness 77 yo male admitted with POV MRI (-) CT (-) for cva workup. Pt positive for dizziness. PMH: CAD Cerebellar CVA HTN DM (multiple admissions in 07/2017)     PT Comments    Patient continues to present with ataxia and with multiple LOB with gait requiring min-max A to recover. Pt cannot tolerate challenges to balance including horizontal and vertical head turns with scissoring noted when looking to L side during gait. Wife and daughter present and supportive. CIR recommended for further skilled PT services to maximize independence and safety with mobility.     Follow Up Recommendations  Supervision/Assistance - 24 hour;CIR     Equipment Recommendations  (TBD)    Recommendations for Other Services       Precautions / Restrictions Precautions Precautions: Fall    Mobility  Bed Mobility               General bed mobility comments: pt sitting EOB upon arrival with family present  Transfers Overall transfer level: Needs assistance Equipment used: 1 person hand held assist Transfers: Sit to/from Stand Sit to Stand: Min assist         General transfer comment: several attempts to stand without assistance however pt unable to come into standing; assistance to rise and to gain balance upon standing;pt bracing himself against EOB  Ambulation/Gait Ambulation/Gait assistance: Min assist;Max assist(min-max A required)   Assistive device: None(assist with gait belt at trunk ) Gait Pattern/deviations: Ataxic;Narrow base of support;Staggering right;Staggering left Gait velocity: decreased   General Gait Details: pt presents with ataxia and mulitple LOB requiring assistance to recover; pt pauses frequently during gait to regain balance; pt cannot tolerate challenges to gait including horizontal (especially to L) and vertical head  turns; pt with scissoring when turning to L side but smoother turn going toward R side   Stairs             Wheelchair Mobility    Modified Rankin (Stroke Patients Only)       Balance Overall balance assessment: Needs assistance Sitting-balance support: Bilateral upper extremity supported;Feet supported Sitting balance-Leahy Scale: Fair     Standing balance support: Bilateral upper extremity supported;During functional activity Standing balance-Leahy Scale: Poor                              Cognition Arousal/Alertness: Awake/alert Behavior During Therapy: WFL for tasks assessed/performed Overall Cognitive Status: Impaired/Different from baseline Area of Impairment: Safety/judgement;Awareness                         Safety/Judgement: Decreased awareness of deficits;Decreased awareness of safety Awareness: Emergent          Exercises      General Comments General comments (skin integrity, edema, etc.): wife and daughter present       Pertinent Vitals/Pain Pain Assessment: No/denies pain    Home Living                      Prior Function            PT Goals (current goals can now be found in the care plan section) Acute Rehab PT Goals Patient Stated Goal: to go home PT Goal Formulation: With patient/family Time For Goal Achievement: 08/03/17 Potential to Achieve Goals: Good  Progress towards PT goals: Progressing toward goals    Frequency    Min 4X/week      PT Plan Current plan remains appropriate    Co-evaluation              AM-PAC PT "6 Clicks" Daily Activity  Outcome Measure  Difficulty turning over in bed (including adjusting bedclothes, sheets and blankets)?: A Little Difficulty moving from lying on back to sitting on the side of the bed? : A Little Difficulty sitting down on and standing up from a chair with arms (e.g., wheelchair, bedside commode, etc,.)?: Unable Help needed moving to and from  a bed to chair (including a wheelchair)?: A Lot Help needed walking in hospital room?: A Lot Help needed climbing 3-5 steps with a railing? : A Lot 6 Click Score: 13    End of Session Equipment Utilized During Treatment: Gait belt Activity Tolerance: Patient tolerated treatment well Patient left: with call bell/phone within reach;with family/visitor present;in chair Nurse Communication: Mobility status PT Visit Diagnosis: Difficulty in walking, not elsewhere classified (R26.2)     Time: 1610-9604 PT Time Calculation (min) (ACUTE ONLY): 21 min  Charges:  $Gait Training: 8-22 mins                    G Codes:       [name], PTA Pager: [phone]     [name] 07/22/2017, 1:35 PM

## 2017-07-22 NOTE — Discharge Instructions (Signed)
Information on my medicine - ELIQUIS (apixaban)  This medication education was reviewed with me or my healthcare representative as part of my discharge preparation.   Why was Eliquis prescribed for you? Eliquis was prescribed for you to reduce the risk of forming blood clots.  What do You need to know about Eliquis ? Take your Eliquis TWICE DAILY - one tablet in the morning and one tablet in the evening with or without food.  It would be best to take the doses about the same time each day.  If you have difficulty swallowing the tablet whole please discuss with your pharmacist how to take the medication safely.  Take Eliquis exactly as prescribed by your doctor and DO NOT stop taking Eliquis without talking to the doctor who prescribed the medication.  Stopping may increase your risk of developing a new clot or stroke.  Refill your prescription before you run out.  After discharge, you should have regular check-up appointments with your healthcare provider that is prescribing your Eliquis.  In the future your dose may need to be changed if your kidney function or weight changes by a significant amount or as you get older.  What do you do if you miss a dose? If you miss a dose, take it as soon as you remember on the same day and resume taking twice daily.  Do not take more than one dose of ELIQUIS at the same time.  Important Safety Information A possible side effect of Eliquis is bleeding. You should call your healthcare provider right away if you experience any of the following: ? Bleeding from an injury or your nose that does not stop. ? Unusual colored urine (red or dark brown) or unusual colored stools (red or black). ? Unusual bruising for unknown reasons. ? A serious fall or if you hit your head (even if there is no bleeding).  Some medicines may interact with Eliquis and might increase your risk of bleeding or clotting while on Eliquis. To help avoid this, consult your  healthcare provider or pharmacist prior to using any new prescription or non-prescription medications, including herbals, vitamins, non-steroidal anti-inflammatory drugs (NSAIDs) and supplements.  This website has more information on Eliquis (apixaban): [email or web address].

## 2017-07-22 NOTE — Progress Notes (Signed)
Rehab admissions - I met with patient and gave him rehab booklets.  Please see rehab consult recommending [facility] therapies unless patient does not progress well.  Patient and family are hopeful for CIR.  I would like to see how patient does with therapy today and then can better determine rehab venue needed.  Call me for questions.  #[phone]

## 2017-07-22 NOTE — Progress Notes (Signed)
STROKE TEAM PROGRESS NOTE      INTERVAL HISTORY His wife and multiple family members are  at the bedside.      He is walking in the hallway with the help of a physical therapist but clearly needs some help and is not stable to go home yet  Vitals:   07/22/17 0015 07/22/17 0409 07/22/17 0748 07/22/17 1205  BP: (!) 106/52 (!) 135/52 (!) 164/67 (!) 169/86  Pulse: 71 66 71 (!) 56  Resp: 16 15 16 16   Temp: 97.9 F (36.6 C) 97.6 F (36.4 C) 98.2 F (36.8 C) (!) 97.5 F (36.4 C)  TempSrc: Oral Oral Oral Oral  SpO2: 97% 100% 98% 100%  Weight:      Height:        CBC:  Recent Labs  Lab 07/19/17 1457  07/20/17 0525 07/21/17 0431  WBC 9.1  --  7.2 6.2  NEUTROABS 6.4  --   --   --   HGB 12.5*   < > 11.2* 10.7*  HCT 37.3*   < > 34.1* 32.9*  MCV 98.2  --  98.0 100.0  PLT 188  --  139* 136*   < > = values in this interval not displayed.    Basic Metabolic Panel:  Recent Labs  Lab 07/20/17 0525 07/21/17 0431  NA 140 141  K 4.0 3.9  CL 107 109  CO2 27 23  GLUCOSE 88 93  BUN 11 10  CREATININE 1.55* 1.41*  CALCIUM 8.5* 8.3*  MG  --  1.8   Lipid Panel:     Component Value Date/Time   CHOL 134 07/20/2017 0525   TRIG 166 (H) 07/20/2017 0525   HDL 33 (L) 07/20/2017 0525   CHOLHDL 4.1 07/20/2017 0525   VLDL 33 07/20/2017 0525   LDLCALC 68 07/20/2017 0525   HgbA1c:  Lab Results  Component Value Date   HGBA1C 6.1 (H) 07/20/2017   Urine Drug Screen:     Component Value Date/Time   LABOPIA NONE DETECTED 07/19/2017 1734   COCAINSCRNUR NONE DETECTED 07/19/2017 1734   LABBENZ NONE DETECTED 07/19/2017 1734   AMPHETMU NONE DETECTED 07/19/2017 1734   THCU NONE DETECTED 07/19/2017 1734   LABBARB NONE DETECTED 07/19/2017 1734    Alcohol Level No results found for: ETH  IMAGING No results found. 2D Echocardiogram  - Left ventricle: The cavity size was normal. There was mild concentric hypertrophy. Systolic function was normal. The estimated ejection fraction was in the  range of 50% to 55%. Wall motion was normal; there were no regional wall motion abnormalities. Doppler parameters are consistent with abnormal left ventricular relaxation (grade 1 diastolic dysfunction). Doppler parameters are consistent with elevated ventricular end-diastolic filling pressure. - Aortic valve: There was mild stenosis. There was trivial regurgitation. Valve area (VTI): 2.08 cm^2. Valve area (Vmax): 1.86 cm^2. Valve area (Vmean): 1.81 cm^2. - Mitral valve: There was mild regurgitation. - Right ventricle: The cavity size was normal. Wall thickness was normal. Systolic function was normal. - Right atrium: The atrium was normal in size. - Pulmonary arteries: Systolic pressure was within the normal range. - Inferior vena cava: The vessel was normal in size. - Pericardium, extracardiac: There was no pericardial effusion. Impressions:   No cardiac source of emboli was indentified.   PHYSICAL EXAM  Pleasant elderly Caucasian male currently not in distress. HEENT-  Normocephalic, no lesions, without obvious abnormality.  Normal external eye and conjunctiva.   Musculoskeletal-no joint tenderness, deformity or swelling Skin-warm and dry  Neurological Examination Mental Status: Alert, oriented, thought content appropriate.  Speech fluent without evidence of aphasia.  Able to follow 3 step commands without difficulty. Cranial Nerves: II:  Visual fields grossly normal,  III,IV, VI: ptosis not present, extra-ocular motions intact bilaterally, pupils equal, round, reactive to light and accommodation- no nystagmus but saccadic dysmetria when he looks to the right and full range of eye movements. Does complain of subjective dizziness when he sits up or moves suddenly. V,VII: smile symmetric--patient does not have any facial droop when smiling, facial light touch sensation normal bilaterally VIII: hearing normal bilaterally IX,X: uvula rises symmetrically XI: bilateral shoulder shrug XII:  midline tongue extension Motor: Right :  Upper extremity   5/5                                      Left:     Upper extremity   5/5             Lower extremity   5/5                                                  Lower extremity   5/5 Tone and bulk:normal tone throughout; no atrophy noted Sensory: Pinprick and light touch intact throughout, bilaterally Deep Tendon Reflexes: 2+ and symmetric throughout Plantars: Right: downgoing                                Left: downgoing recrudescence of previous Cerebellar: normal finger-to-nose --significant postural tremor bilaterally in the arms.  Mild dysmetria on the right however this may be due to his tremor. Gait: significant truncal ataxia with leaning to the right  ASSESSMENT/PLAN [name] is a 77 y.o. 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   Posterior circulationTIA / recrudescence of previous stroke symptoms  Resultant  Nausea, vomiting ,dizziness and gait ataxia  Code Stroke CT head No acute stroke.  Moderate to severe small vessel disease.  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  ASPECTS 10.     CTA head no ELVO.  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  Moderate to severe canal stenosis C4-5 with severe neuroforaminal narrowing C3-4 and C4-5  MRI  No acute stroke.  Old left cerebellar infarct.  Moderate to advanced small vessel disease.  2D Echo  EF 50-55%. No source of embolus   LDL 68  HgbA1c 6.1  Eliquis for VTE prophylaxis  Eliquis (apixaban) daily prior to admission, now on Eliquis (apixaban) daily.  Continue Eliquis at discharge  Therapy recommendations:  OP OT  Disposition:  Return home with wife  Ongoing symptomatic control.  Expect it will improve.  Hx DVT, PE  On chronic anticoagulation.   Was on coumadin until stroke in 2017 when he was changed to  Eliquis  Hypertension  Stable . BP goal normotensive  Hyperlipidemia  Home meds:  crestor 10, changed to lipitor 80 on admission to hospital  LDL 68, goal < 70  Continue Crestor 10 mg, Continue at discharge. Orders adjusted  Other Stroke Risk Factors  Advanced age  Hx stroke/TIA  06/2015 - left PICA and cerebellar vermis infarcts embolic pattern, secondary to unknown source. Etiology could be cardioembolic as pt has MAT on exam with potential afib vs. 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 Continue eliquis for long-term anticoagulation given history of pulmonary embolism and DVT. Maintain adequate hydration and avoid hypotension.e is still ataxic with his gait and likely needs to stay longer and get physical therapy possibly at home or even in inpatient setting.  Stroke team will sign off. Kindly call for questions. Discussed with patient and family members  Antony Contras, MD Medical Director [place] Pager: [phone] 07/22/2017 2:22 PM  To contact Stroke Continuity provider, please refer to [email or web address] After hours, contact General Neurology

## 2017-07-22 NOTE — Progress Notes (Addendum)
PROGRESS NOTE  [name]:[id number] DOB: 1940-10-04 DOA: 07/19/2017 PCP: Renato Shin, MD  Brief Narrative: 77 year old male PMH cerebellar stroke, currently on Eliquis, suddenly became diaphoretic, staggered and vomited, dizzy.  Presented to the emergency department where CT scan did not show acute stroke but did show old left cerebellar stroke.  Not a candidate for TPA as the patient had taken apixaban at home.  Neurology suspected posterior circulation TIA and recommended continuing Eliquis, avoid hypotension.  Patient did develop gait disturbance and tremors.  Was reevaluated by neurology.  Assessment/Plan Posterior circulation TIA, recrudescence of previous stroke symptoms.  Gait ataxia noted.  Imaging studies as documented by neurology. --Continue apixaban per neurology. --No further recommendations per neurology.  Thrombocytopenia.  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 --Stable.  No further inpatient evaluation planned   CKD stage III --stable  Ocular myasthenia.  Continue Mestinon.  PMH DVT, PE.  Continue apixaban.  Chronic diastolic CHF.  Remains stable.   Improved but still weak.  Hopefully CIR 6/14.  DVT prophylaxis: enoxaparin Code Status: full Family Communication: none Disposition Plan: pending    Murray Hodgkins, MD  [facility] Direct contact: [phone] --Via amion app OR  --[email or web address]; password [email or web address]  7PM-7AM contact night coverage as above 07/22/2017, 4:49 PM  LOS: 1 day   Consultants:  Neurology   Procedures:  Echo Study Conclusions  - Left ventricle: The cavity size was normal. There was mild   concentric hypertrophy. Systolic function was normal. The   estimated ejection fraction was in the range of 50% to 55%. Wall   motion was normal; there were no regional wall motion   abnormalities. Doppler parameters are consistent with abnormal   left ventricular relaxation (grade 1 diastolic dysfunction).   Doppler parameters are  consistent with elevated ventricular   end-diastolic filling pressure. - Aortic valve: There was mild stenosis. There was trivial   regurgitation. Valve area (VTI): 2.08 cm^2. Valve area (Vmax):   1.86 cm^2. Valve area (Vmean): 1.81 cm^2. - Mitral valve: There was mild regurgitation. - Right ventricle: The cavity size was normal. Wall thickness was   normal. Systolic function was normal. - Right atrium: The atrium was normal in size. - Pulmonary arteries: Systolic pressure was within the normal   range. - Inferior vena cava: The vessel was normal in size. - Pericardium, extracardiac: There was no pericardial effusion.  Impressions:  - No cardiac source of emboli was indentified.  Antimicrobials:    Interval history/Subjective: Feels better today.  Objective: Vitals:  Vitals:   07/22/17 1205 07/22/17 1517  BP: (!) 169/86 (!) 164/86  Pulse: (!) 56 72  Resp: 16 16  Temp: (!) 97.5 F (36.4 C) 98.2 F (36.8 C)  SpO2: 100% 100%    Exam:  Constitutional:   . Appears calm and comfortable Respiratory:  . CTA bilaterally, no w/r/r.  . Respiratory effort normal.  Cardiovascular:  . RRR, no m/r/g . No LE extremity edema   Psychiatric:  . Mental status o Mood, affect appropriate  I have personally reviewed the following:   Labs:  Blood sugars stable.  Scheduled Meds: .  stroke: mapping our early stages of recovery book   Does not apply Once  . apixaban  5 mg Oral BID  . brimonidine  1 drop Both Eyes BID   And  . timolol  1 drop Both Eyes BID  . famotidine  20 mg Oral BID  . imatinib  300 mg Oral Q1200  . insulin aspart  0-9  Units Subcutaneous TID WC  . isosorbide mononitrate  30 mg Oral Daily  . predniSONE  3 mg Oral Q breakfast  . pyridostigmine  60 mg Oral Daily  . rosuvastatin  5 mg Oral q1800  . tobramycin  1 drop Both Eyes QID   Continuous Infusions:  Principal Problem:   TIA (transient ischemic attack) Active Problems:   Chronic myeloid  leukemia in remission [facility])   Essential hypertension   Hearing loss   Diabetes mellitus type 2, controlled ([place])   History of pulmonary embolism   History of DVT (deep vein thrombosis)   Ocular myasthenia (HCC)   Chronic diastolic CHF (congestive heart failure) (HCC)   Thrombocytopenia ([place])   Ataxia   LOS: 1 day

## 2017-07-23 ENCOUNTER — Inpatient Hospital Stay (HOSPITAL_COMMUNITY)
Admission: RE | Admit: 2017-07-23 | Discharge: 2017-07-30 | DRG: 057 | Disposition: A | Discharge status: Home / Self Care | Payer: Medicare Other | Source: Intra-hospital | Attending: Physical Medicine & Rehabilitation | Admitting: Physical Medicine & Rehabilitation

## 2017-07-23 ENCOUNTER — Encounter (HOSPITAL_COMMUNITY): Payer: Self-pay | Admitting: Nurse Practitioner

## 2017-07-23 ENCOUNTER — Other Ambulatory Visit: Payer: Self-pay

## 2017-07-23 DIAGNOSIS — I129 Hypertensive chronic kidney disease with stage 1 through stage 4 chronic kidney disease, or unspecified chronic kidney disease: Secondary | ICD-10-CM | POA: Diagnosis present

## 2017-07-23 DIAGNOSIS — M810 Age-related osteoporosis without current pathological fracture: Secondary | ICD-10-CM | POA: Diagnosis present

## 2017-07-23 DIAGNOSIS — R262 Difficulty in walking, not elsewhere classified: Secondary | ICD-10-CM | POA: Diagnosis present

## 2017-07-23 DIAGNOSIS — G8929 Other chronic pain: Secondary | ICD-10-CM | POA: Diagnosis present

## 2017-07-23 DIAGNOSIS — Z9841 Cataract extraction status, right eye: Secondary | ICD-10-CM | POA: Diagnosis not present

## 2017-07-23 DIAGNOSIS — Z87442 Personal history of urinary calculi: Secondary | ICD-10-CM | POA: Diagnosis not present

## 2017-07-23 DIAGNOSIS — G7000 Myasthenia gravis without (acute) exacerbation: Secondary | ICD-10-CM

## 2017-07-23 DIAGNOSIS — N183 Chronic kidney disease, stage 3 (moderate): Secondary | ICD-10-CM | POA: Diagnosis present

## 2017-07-23 DIAGNOSIS — E119 Type 2 diabetes mellitus without complications: Secondary | ICD-10-CM | POA: Diagnosis present

## 2017-07-23 DIAGNOSIS — N189 Chronic kidney disease, unspecified: Secondary | ICD-10-CM | POA: Diagnosis present

## 2017-07-23 DIAGNOSIS — I2510 Atherosclerotic heart disease of native coronary artery without angina pectoris: Secondary | ICD-10-CM | POA: Diagnosis present

## 2017-07-23 DIAGNOSIS — R29703 NIHSS score 3: Secondary | ICD-10-CM | POA: Diagnosis present

## 2017-07-23 DIAGNOSIS — Z832 Family history of diseases of the blood and blood-forming organs and certain disorders involving the immune mechanism: Secondary | ICD-10-CM

## 2017-07-23 DIAGNOSIS — Z79899 Other long term (current) drug therapy: Secondary | ICD-10-CM

## 2017-07-23 DIAGNOSIS — Z888 Allergy status to other drugs, medicaments and biological substances status: Secondary | ICD-10-CM

## 2017-07-23 DIAGNOSIS — K219 Gastro-esophageal reflux disease without esophagitis: Secondary | ICD-10-CM | POA: Diagnosis present

## 2017-07-23 DIAGNOSIS — Z86010 Personal history of colonic polyps: Secondary | ICD-10-CM

## 2017-07-23 DIAGNOSIS — Z9842 Cataract extraction status, left eye: Secondary | ICD-10-CM

## 2017-07-23 DIAGNOSIS — G459 Transient cerebral ischemic attack, unspecified: Secondary | ICD-10-CM | POA: Diagnosis not present

## 2017-07-23 DIAGNOSIS — H409 Unspecified glaucoma: Secondary | ICD-10-CM | POA: Diagnosis present

## 2017-07-23 DIAGNOSIS — Z86718 Personal history of other venous thrombosis and embolism: Secondary | ICD-10-CM

## 2017-07-23 DIAGNOSIS — I639 Cerebral infarction, unspecified: Secondary | ICD-10-CM | POA: Diagnosis not present

## 2017-07-23 DIAGNOSIS — Z7952 Long term (current) use of systemic steroids: Secondary | ICD-10-CM

## 2017-07-23 DIAGNOSIS — E1151 Type 2 diabetes mellitus with diabetic peripheral angiopathy without gangrene: Secondary | ICD-10-CM | POA: Diagnosis present

## 2017-07-23 DIAGNOSIS — Z8249 Family history of ischemic heart disease and other diseases of the circulatory system: Secondary | ICD-10-CM | POA: Diagnosis not present

## 2017-07-23 DIAGNOSIS — I69351 Hemiplegia and hemiparesis following cerebral infarction affecting right dominant side: Principal | ICD-10-CM

## 2017-07-23 DIAGNOSIS — Z823 Family history of stroke: Secondary | ICD-10-CM | POA: Diagnosis not present

## 2017-07-23 DIAGNOSIS — G4700 Insomnia, unspecified: Secondary | ICD-10-CM | POA: Diagnosis present

## 2017-07-23 DIAGNOSIS — F40240 Claustrophobia: Secondary | ICD-10-CM | POA: Diagnosis present

## 2017-07-23 DIAGNOSIS — Z79891 Long term (current) use of opiate analgesic: Secondary | ICD-10-CM

## 2017-07-23 DIAGNOSIS — H5789 Other specified disorders of eye and adnexa: Secondary | ICD-10-CM | POA: Diagnosis present

## 2017-07-23 DIAGNOSIS — I10 Essential (primary) hypertension: Secondary | ICD-10-CM

## 2017-07-23 DIAGNOSIS — G8191 Hemiplegia, unspecified affecting right dominant side: Secondary | ICD-10-CM | POA: Diagnosis not present

## 2017-07-23 DIAGNOSIS — E782 Mixed hyperlipidemia: Secondary | ICD-10-CM | POA: Diagnosis present

## 2017-07-23 DIAGNOSIS — E1143 Type 2 diabetes mellitus with diabetic autonomic (poly)neuropathy: Secondary | ICD-10-CM

## 2017-07-23 DIAGNOSIS — Z7951 Long term (current) use of inhaled steroids: Secondary | ICD-10-CM

## 2017-07-23 DIAGNOSIS — Z86711 Personal history of pulmonary embolism: Secondary | ICD-10-CM | POA: Diagnosis not present

## 2017-07-23 DIAGNOSIS — Z803 Family history of malignant neoplasm of breast: Secondary | ICD-10-CM

## 2017-07-23 DIAGNOSIS — C9211 Chronic myeloid leukemia, BCR/ABL-positive, in remission: Secondary | ICD-10-CM | POA: Diagnosis present

## 2017-07-23 DIAGNOSIS — E1122 Type 2 diabetes mellitus with diabetic chronic kidney disease: Secondary | ICD-10-CM | POA: Diagnosis present

## 2017-07-23 DIAGNOSIS — I69393 Ataxia following cerebral infarction: Secondary | ICD-10-CM | POA: Diagnosis not present

## 2017-07-23 DIAGNOSIS — Z7901 Long term (current) use of anticoagulants: Secondary | ICD-10-CM

## 2017-07-23 DIAGNOSIS — K5730 Diverticulosis of large intestine without perforation or abscess without bleeding: Secondary | ICD-10-CM | POA: Diagnosis present

## 2017-07-23 DIAGNOSIS — E118 Type 2 diabetes mellitus with unspecified complications: Secondary | ICD-10-CM | POA: Diagnosis not present

## 2017-07-23 DIAGNOSIS — M549 Dorsalgia, unspecified: Secondary | ICD-10-CM | POA: Diagnosis present

## 2017-07-23 DIAGNOSIS — Z882 Allergy status to sulfonamides status: Secondary | ICD-10-CM

## 2017-07-23 LAB — BASIC METABOLIC PANEL
Anion gap: 7 (ref 5–15)
BUN: 10 mg/dL (ref 6–20)
CHLORIDE: 106 mmol/L (ref 101–111)
CO2: 28 mmol/L (ref 22–32)
CREATININE: 1.55 mg/dL — AB (ref 0.61–1.24)
Calcium: 8.6 mg/dL — ABNORMAL LOW (ref 8.9–10.3)
GFR, EST AFRICAN AMERICAN: 47 mL/min — AB (ref >60)
GFR, EST NON AFRICAN AMERICAN: 41 mL/min — AB (ref >60)
Glucose, Bld: 140 mg/dL — ABNORMAL HIGH (ref 65–99)
Potassium: 3.3 mmol/L — ABNORMAL LOW (ref 3.5–5.1)
SODIUM: 141 mmol/L (ref 135–145)

## 2017-07-23 LAB — GLUCOSE, CAPILLARY
GLUCOSE-CAPILLARY: 89 mg/dL (ref 65–99)
GLUCOSE-CAPILLARY: 116 mg/dL — AB (ref 65–99)
Glucose-Capillary: 131 mg/dL — ABNORMAL HIGH (ref 65–99)
Glucose-Capillary: 178 mg/dL — ABNORMAL HIGH (ref 65–99)

## 2017-07-23 LAB — MAGNESIUM: MAGNESIUM: 1.7 mg/dL (ref 1.7–2.4)

## 2017-07-23 MED ORDER — INSULIN ASPART 100 UNIT/ML ~~LOC~~ SOLN: 0.0000 [IU] | Freq: Every day | SUBCUTANEOUS | Status: DC

## 2017-07-23 MED ORDER — ACETAMINOPHEN 325 MG PO TABS: 325.0000 mg | ORAL_TABLET | ORAL | Status: DC | PRN

## 2017-07-23 MED ORDER — IMATINIB MESYLATE 100 MG PO TABS
300.0000 mg | ORAL_TABLET | Freq: Every day | ORAL | Status: DC
  Administered 2017-07-24 – 2017-07-29 (×6): 300 mg via ORAL
  Filled 2017-07-23 (×8): qty 3

## 2017-07-23 MED ORDER — PROCHLORPERAZINE 25 MG RE SUPP: 12.5000 mg | Freq: Four times a day (QID) | RECTAL | Status: DC | PRN

## 2017-07-23 MED ORDER — NAPHAZOLINE-GLYCERIN 0.012-0.2 % OP SOLN
1.0000 [drp] | Freq: Three times a day (TID) | OPHTHALMIC | Status: DC
  Administered 2017-07-23: 2 [drp] via OPHTHALMIC
  Administered 2017-07-24 (×3): 1 [drp] via OPHTHALMIC
  Administered 2017-07-25: 2 [drp] via OPHTHALMIC
  Administered 2017-07-25 – 2017-07-26 (×5): 1 [drp] via OPHTHALMIC
  Administered 2017-07-27: 2 [drp] via OPHTHALMIC
  Administered 2017-07-27: 1 [drp] via OPHTHALMIC
  Administered 2017-07-27 – 2017-07-28 (×2): 2 [drp] via OPHTHALMIC
  Administered 2017-07-28: 1 [drp] via OPHTHALMIC
  Administered 2017-07-28: 2 [drp] via OPHTHALMIC
  Administered 2017-07-29: 1 [drp] via OPHTHALMIC
  Administered 2017-07-29 – 2017-07-30 (×3): 2 [drp] via OPHTHALMIC
  Filled 2017-07-23: qty 15

## 2017-07-23 MED ORDER — GUAIFENESIN-DM 100-10 MG/5ML PO SYRP: 5.0000 mL | ORAL_SOLUTION | Freq: Four times a day (QID) | ORAL | Status: DC | PRN

## 2017-07-23 MED ORDER — MAGNESIUM OXIDE 400 (241.3 MG) MG PO TABS
400.0000 mg | ORAL_TABLET | Freq: Two times a day (BID) | ORAL | Status: DC
  Administered 2017-07-23: 400 mg via ORAL
  Filled 2017-07-23: qty 1

## 2017-07-23 MED ORDER — TOBRAMYCIN 0.3 % OP SOLN
1.0000 [drp] | Freq: Four times a day (QID) | OPHTHALMIC | Status: DC
  Administered 2017-07-23 – 2017-07-30 (×27): 1 [drp] via OPHTHALMIC
  Filled 2017-07-23: qty 5

## 2017-07-23 MED ORDER — FAMOTIDINE 20 MG PO TABS: 20.0000 mg | ORAL_TABLET | Freq: Two times a day (BID) | ORAL | Status: DC

## 2017-07-23 MED ORDER — INSULIN ASPART 100 UNIT/ML ~~LOC~~ SOLN
0.0000 [IU] | Freq: Three times a day (TID) | SUBCUTANEOUS | Status: DC
  Administered 2017-07-23 – 2017-07-26 (×4): 1 [IU] via SUBCUTANEOUS
  Administered 2017-07-27: 2 [IU] via SUBCUTANEOUS
  Administered 2017-07-28 – 2017-07-29 (×3): 1 [IU] via SUBCUTANEOUS

## 2017-07-23 MED ORDER — MECLIZINE HCL 25 MG PO TABS: 25.0000 mg | ORAL_TABLET | Freq: Three times a day (TID) | ORAL | Status: DC | PRN

## 2017-07-23 MED ORDER — DIPHENHYDRAMINE HCL 12.5 MG/5ML PO ELIX: 12.5000 mg | ORAL_SOLUTION | Freq: Four times a day (QID) | ORAL | Status: DC | PRN

## 2017-07-23 MED ORDER — TRAMADOL HCL 50 MG PO TABS: 50.0000 mg | ORAL_TABLET | Freq: Three times a day (TID) | ORAL | Status: DC | PRN

## 2017-07-23 MED ORDER — SENNOSIDES-DOCUSATE SODIUM 8.6-50 MG PO TABS: 1.0000 | ORAL_TABLET | Freq: Every evening | ORAL | Status: DC | PRN

## 2017-07-23 MED ORDER — POLYETHYLENE GLYCOL 3350 17 G PO PACK: 17.0000 g | PACK | Freq: Every day | ORAL | Status: DC | PRN

## 2017-07-23 MED ORDER — BRIMONIDINE TARTRATE 0.2 % OP SOLN
1.0000 [drp] | Freq: Two times a day (BID) | OPHTHALMIC | Status: DC
  Administered 2017-07-23 – 2017-07-30 (×14): 1 [drp] via OPHTHALMIC
  Filled 2017-07-23: qty 5

## 2017-07-23 MED ORDER — PREDNISONE 1 MG PO TABS
3.0000 mg | ORAL_TABLET | Freq: Every day | ORAL | Status: DC
  Administered 2017-07-24 – 2017-07-30 (×7): 3 mg via ORAL
  Filled 2017-07-23 (×7): qty 3

## 2017-07-23 MED ORDER — ROSUVASTATIN CALCIUM 10 MG PO TABS
5.0000 mg | ORAL_TABLET | Freq: Every day | ORAL | Status: DC
  Administered 2017-07-23 – 2017-07-29 (×7): 5 mg via ORAL
  Filled 2017-07-23 (×7): qty 1

## 2017-07-23 MED ORDER — BISACODYL 10 MG RE SUPP: 10.0000 mg | Freq: Every day | RECTAL | Status: DC | PRN

## 2017-07-23 MED ORDER — TIMOLOL MALEATE 0.5 % OP SOLN
1.0000 [drp] | Freq: Two times a day (BID) | OPHTHALMIC | Status: DC
  Administered 2017-07-23 – 2017-07-30 (×14): 1 [drp] via OPHTHALMIC
  Filled 2017-07-23: qty 5

## 2017-07-23 MED ORDER — LORAZEPAM 0.5 MG PO TABS
0.5000 mg | ORAL_TABLET | Freq: Every evening | ORAL | Status: DC | PRN
  Administered 2017-07-26 – 2017-07-29 (×4): 0.5 mg via ORAL
  Filled 2017-07-23 (×4): qty 1

## 2017-07-23 MED ORDER — LIDOCAINE 5 % EX PTCH: 1.0000 | MEDICATED_PATCH | Freq: Every day | CUTANEOUS | Status: DC | PRN

## 2017-07-23 MED ORDER — APIXABAN 5 MG PO TABS
5.0000 mg | ORAL_TABLET | Freq: Two times a day (BID) | ORAL | Status: DC
  Administered 2017-07-23 – 2017-07-30 (×14): 5 mg via ORAL
  Filled 2017-07-23 (×14): qty 1

## 2017-07-23 MED ORDER — FLEET ENEMA 7-19 GM/118ML RE ENEM: 1.0000 | ENEMA | Freq: Once | RECTAL | Status: DC | PRN

## 2017-07-23 MED ORDER — PROCHLORPERAZINE MALEATE 5 MG PO TABS: 5.0000 mg | ORAL_TABLET | Freq: Four times a day (QID) | ORAL | Status: DC | PRN

## 2017-07-23 MED ORDER — TRAZODONE HCL 50 MG PO TABS
25.0000 mg | ORAL_TABLET | Freq: Every evening | ORAL | Status: DC | PRN
  Administered 2017-07-25: 50 mg via ORAL
  Filled 2017-07-23: qty 1

## 2017-07-23 MED ORDER — ALUM & MAG HYDROXIDE-SIMETH 200-200-20 MG/5ML PO SUSP: 30.0000 mL | ORAL | Status: DC | PRN

## 2017-07-23 MED ORDER — FAMOTIDINE 10 MG PO TABS
10.0000 mg | ORAL_TABLET | Freq: Two times a day (BID) | ORAL | Status: DC
  Administered 2017-07-23 – 2017-07-30 (×14): 10 mg via ORAL
  Filled 2017-07-23 (×14): qty 1

## 2017-07-23 MED ORDER — PYRIDOSTIGMINE BROMIDE 60 MG PO TABS
60.0000 mg | ORAL_TABLET | Freq: Every day | ORAL | Status: DC
  Administered 2017-07-24 – 2017-07-30 (×7): 60 mg via ORAL
  Filled 2017-07-23 (×7): qty 1

## 2017-07-23 MED ORDER — POTASSIUM CHLORIDE CRYS ER 20 MEQ PO TBCR
40.0000 meq | EXTENDED_RELEASE_TABLET | ORAL | Status: DC
  Administered 2017-07-23: 40 meq via ORAL
  Filled 2017-07-23: qty 2

## 2017-07-23 MED ORDER — PROCHLORPERAZINE EDISYLATE 10 MG/2ML IJ SOLN: 5.0000 mg | Freq: Four times a day (QID) | INTRAMUSCULAR | Status: DC | PRN

## 2017-07-23 MED ORDER — ISOSORBIDE MONONITRATE ER 30 MG PO TB24
30.0000 mg | ORAL_TABLET | Freq: Every day | ORAL | Status: DC
  Administered 2017-07-24 – 2017-07-30 (×7): 30 mg via ORAL
  Filled 2017-07-23 (×7): qty 1

## 2017-07-23 NOTE — Plan of Care (Signed)
Adequate for discharge.

## 2017-07-23 NOTE — Discharge Summary (Signed)
Physician Discharge Summary  [name] ZSM:[id number] DOB: 1940-10-08 DOA: 07/19/2017  PCP: Renato Shin, MD  Admit date: 07/19/2017 Discharge date: 07/23/2017  Recommendations for Outpatient Follow-up:  1. Thrombocytopenia, consider intermittent CBC     Discharge Diagnoses:  1. Posterior circulation TIA, recrudescence of previous stroke symptoms 2. Thrombocytopenia 3. CKD stage III 4. Ocular myasthenia  Discharge Condition: improved Disposition: CIR  Diet recommendation: diabetic diet  Filed Weights   07/19/17 1431 07/19/17 1442 07/20/17 0455  Weight: 81.2 kg (179 lb) 81.6 kg (180 lb) 80.1 kg (176 lb 9.4 oz)    History of present illness:  77 year old male PMH cerebellar stroke, currently on Eliquis, suddenly became diaphoretic, staggered and vomited, dizzy.  Presented to the emergency department where CT scan did not show acute stroke but did show old left cerebellar stroke.  Not a candidate for TPA as the patient had taken apixaban at home.    Hospital Course:  Neurology suspected posterior circulation TIA and recommended continuing Eliquis, avoid hypotension.  Patient did develop gait disturbance and tremors.  Was reevaluated by neurology but no further recommendations were made other than to pursue therapy.  Patient was evaluated by therapy with recommendation for CIR and the patient has been accepted for transfer today.  Individual issues as below.  Posterior circulation TIA, recrudescence of previous stroke symptoms.  Gait ataxia noted.  Imaging studies as documented by neurology. --Continue apixaban per neurology. --No further recommendations per neurology.  Per neurology: "Posterior circulationTIA / recrudescence of previous stroke symptoms  Resultant Nausea, vomiting ,dizziness and gait ataxia  Code Stroke CT head No acute stroke.  Moderate to severe small vessel disease.  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  ASPECTS 10.     CTA head no ELVO.  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  Moderate to severe canal stenosis C4-5 with severe neuroforaminal narrowing C3-4 and C4-5  MRI  No acute stroke.  Old left cerebellar infarct.  Moderate to advanced small vessel disease.  2D Echo  EF 50-55%. No source of embolus   LDL 68  HgbA1c 6.1  Eliquis for VTE prophylaxis  Eliquis (apixaban) daily prior to admission, now on Eliquis (apixaban) daily.  Continue Eliquis at discharge    Thrombocytopenia.  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 --Stable.  No further inpatient evaluation planned   CKD stage III --stable  Ocular myasthenia.  Continue Mestinon.  PMH DVT, PE.  Continue apixaban.  Chronic diastolic CHF.  Remains stable.  Consultants:  Neurology   Procedures:  Echo Study Conclusions  - Left ventricle: The cavity size was normal. There was mild concentric hypertrophy. Systolic function was normal. The estimated ejection fraction was in the range of 50% to 55%. Wall motion was normal; there were no regional wall motion abnormalities. Doppler parameters are consistent with abnormal left ventricular relaxation (grade 1 diastolic dysfunction). Doppler parameters are consistent with elevated ventricular end-diastolic filling pressure. - Aortic valve: There was mild stenosis. There was trivial regurgitation. Valve area (VTI): 2.08 cm^2. Valve area (Vmax): 1.86 cm^2. Valve area (Vmean): 1.81 cm^2. - Mitral valve: There was mild regurgitation. - Right ventricle: The cavity size was normal. Wall thickness was normal. Systolic function was normal. - Right atrium: The atrium was normal in size. - Pulmonary arteries: Systolic pressure was within the normal range. - Inferior vena cava: The vessel was normal in size. - Pericardium, extracardiac: There was no pericardial effusion.  Impressions:  - No cardiac source of  emboli was indentified.  Today's assessment: S:  Overall feels well.  Had an episode of dizziness earlier today which resolved after about 10 minutes. O: Vitals:  Vitals:   07/23/17 0800 07/23/17 1301  BP: (!) 142/59 140/74  Pulse: 74 68  Resp: 17 16  Temp: 97.8 F (36.6 C) 98.2 F (36.8 C)  SpO2: 99% 99%    Constitutional:  . Appears calm and comfortable Respiratory:  . CTA bilaterally, no w/r/r.  . Respiratory effort normal Cardiovascular:  . RRR, no m/r/g . No LE extremity edema   Musculoskeletal:  . RUE, LUE, RLE, LLE   . Extremities to command Psychiatric:  . judgement and insight appear normal . Mental status o Mood, affect appropriate  Potassium 3.3, magnesium 1.7. Telemetry showed frequent PVCs  Discharge Instructions    Allergies  Allergen Reactions  . Phenergan [Promethazine Hcl] Swelling and Other (See Comments)    Cannot be combined with Zofran because swelling of the eyes resulted and patient became very restless-  . Zofran [Ondansetron Hcl] Swelling and Other (See Comments)    Cannot be combined with Phenergan because swelling of the eyes resulted and patient became very restless-  . Sulfamethoxazole Rash and Itching    The results of significant diagnostics from this hospitalization (including imaging, microbiology, ancillary and laboratory) are listed below for reference.    Significant Diagnostic Studies: Ct Angio Head W Or Wo Contrast  Result Date: 07/19/2017 CLINICAL DATA:  Follow up code stroke. History of hypertension, carotid artery disease. EXAM: CT ANGIOGRAPHY HEAD AND NECK TECHNIQUE: Multidetector CT imaging of the head and neck was performed using the standard protocol during bolus administration of intravenous contrast. Multiplanar CT image reconstructions and MIPs were obtained to evaluate the vascular anatomy. Carotid stenosis measurements (when applicable) are obtained utilizing NASCET criteria, using the distal internal carotid diameter as the denominator. CONTRAST:  126mL ISOVUE-370  IOPAMIDOL (ISOVUE-370) INJECTION 76% COMPARISON:  CT HEAD July 19, 2017 and CT angiogram head and neck Jul 03, 2016. FINDINGS: CTA NECK FINDINGS: AORTIC ARCH: Normal appearance of the thoracic arch, normal branch pattern. Mild calcific atherosclerosis aortic arch. The origins of the innominate, left Common carotid artery and subclavian artery are widely patent. RIGHT CAROTID SYSTEM: Common carotid artery is patent. Severe calcific atherosclerosis and intimal thickening with similarly ulcerated RIGHT internal carotid artery origin versus old dissection. No hemodynamically significant stenosis by NASCET criteria. Patent internal carotid artery. LEFT CAROTID SYSTEM: Common carotid artery is patent. Mild calcific atherosclerosis of the carotid bifurcation without hemodynamically significant stenosis by NASCET criteria. Normal appearance of the internal carotid artery. VERTEBRAL ARTERIES:RIGHT vertebral artery is dominant. Moderate stenosis LEFT vertebral artery origin. Similarly attenuated and intermittently occluded LEFT vertebral artery with tandem reconstitution via muscular cutaneous branches. SKELETON: No acute osseous process though bone windows have not been submitted. Moderate canal stenosis C3-4, moderate to severe canal stenosis C4-5, moderate at C6-7. Moderate LEFT C2-3, severe bilateral C3-4, severe bilateral C4-5 neural foraminal narrowing. OTHER NECK: Soft tissues of the neck are nonacute though, not tailored for evaluation. UPPER CHEST: Included lung apices are clear. No superior mediastinal lymphadenopathy. CTA HEAD FINDINGS: ANTERIOR CIRCULATION: Patent cervical internal carotid arteries, petrous, cavernous and supra clinoid internal carotid arteries. Patent anterior communicating artery. Patent anterior and middle cerebral arteries. No large vessel occlusion, significant stenosis, contrast extravasation or aneurysm. POSTERIOR CIRCULATION: Patent vertebral arteries, vertebrobasilar junction and basilar  artery, as well as main branch vessels. Moderate calcific atherosclerosis RIGHT V4 segment resulting in mild stenosis. Luminal irregularity  LEFT V4 segment is unchanged, possible dissection or collateral vessels, moderate stenosis mid to distal LEFT V4 segment. Patent posterior cerebral arteries. Robust bilateral posterior communicating arteries. Mild stenosis RIGHT P1 segment. No large vessel occlusion, significant stenosis, contrast extravasation or aneurysm. VENOUS SINUSES: Major dural venous sinuses are patent though not tailored for evaluation on this angiographic examination. ANATOMIC VARIANTS: None. DELAYED PHASE: Not performed. MIP images reviewed. IMPRESSION: CTA neck: 1. No hemodynamically significant stenosis or acute vascular process. 2. 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 3. Similarly occluded proximal LEFT vertebral artery with reconstitution. 4. Moderate to severe canal stenosis C4-5. Severe neural foraminal narrowing C3-4 and C4-5. CTA head: 1. No emergent large vessel occlusion or flow-limiting stenosis. 2. Moderate stenosis LEFT V4 segment. Aortic Atherosclerosis (ICD10-I70.0). Critical Value/emergent results text paged to [employer] via AMION secure system on 07/19/2017 at 3:37 pm, including interpreting physician's phone number. Electronically Signed   By: Elon Alas M.D.   On: 07/19/2017 15:49   Dg Chest 2 View  Result Date: 06/27/2017 CLINICAL DATA:  Acute chest pain and shortness of breath for 2 weeks. EXAM: CHEST - 2 VIEW COMPARISON:  06/06/2017 chest CT and chest radiographs. FINDINGS: The cardiomediastinal silhouette is unremarkable. Mild elevation of the RIGHT hemidiaphragm is unchanged. There is no evidence of focal airspace disease, pulmonary edema, suspicious pulmonary nodule/mass, pleural effusion, or pneumothorax. No acute bony abnormalities are identified. IMPRESSION: No active cardiopulmonary disease. Electronically Signed    By: Margarette Canada M.D.   On: 06/27/2017 22:02   Dg Ribs Unilateral Right  Result Date: 06/28/2017 CLINICAL DATA:  Right rib pain. EXAM: RIGHT RIBS - 2 VIEW COMPARISON:  Chest radiograph yesterday.  Chest CT 06/06/2017 FINDINGS: No fracture or other bone lesions are seen involving the ribs. Included lungs are clear. IMPRESSION: Negative radiographs of the right ribs. Electronically Signed   By: Jeb Levering M.D.   On: 06/28/2017 03:14   Ct Angio Neck W Or Wo Contrast  Result Date: 07/19/2017 CLINICAL DATA:  Follow up code stroke. History of hypertension, carotid artery disease. EXAM: CT ANGIOGRAPHY HEAD AND NECK TECHNIQUE: Multidetector CT imaging of the head and neck was performed using the standard protocol during bolus administration of intravenous contrast. Multiplanar CT image reconstructions and MIPs were obtained to evaluate the vascular anatomy. Carotid stenosis measurements (when applicable) are obtained utilizing NASCET criteria, using the distal internal carotid diameter as the denominator. CONTRAST:  175mL ISOVUE-370 IOPAMIDOL (ISOVUE-370) INJECTION 76% COMPARISON:  CT HEAD July 19, 2017 and CT angiogram head and neck Jul 03, 2016. FINDINGS: CTA NECK FINDINGS: AORTIC ARCH: Normal appearance of the thoracic arch, normal branch pattern. Mild calcific atherosclerosis aortic arch. The origins of the innominate, left Common carotid artery and subclavian artery are widely patent. RIGHT CAROTID SYSTEM: Common carotid artery is patent. Severe calcific atherosclerosis and intimal thickening with similarly ulcerated RIGHT internal carotid artery origin versus old dissection. No hemodynamically significant stenosis by NASCET criteria. Patent internal carotid artery. LEFT CAROTID SYSTEM: Common carotid artery is patent. Mild calcific atherosclerosis of the carotid bifurcation without hemodynamically significant stenosis by NASCET criteria. Normal appearance of the internal carotid artery. VERTEBRAL  ARTERIES:RIGHT vertebral artery is dominant. Moderate stenosis LEFT vertebral artery origin. Similarly attenuated and intermittently occluded LEFT vertebral artery with tandem reconstitution via muscular cutaneous branches. SKELETON: No acute osseous process though bone windows have not been submitted. Moderate canal stenosis C3-4, moderate to severe canal stenosis C4-5, moderate at C6-7. Moderate LEFT C2-3, severe bilateral C3-4, severe  bilateral C4-5 neural foraminal narrowing. OTHER NECK: Soft tissues of the neck are nonacute though, not tailored for evaluation. UPPER CHEST: Included lung apices are clear. No superior mediastinal lymphadenopathy. CTA HEAD FINDINGS: ANTERIOR CIRCULATION: Patent cervical internal carotid arteries, petrous, cavernous and supra clinoid internal carotid arteries. Patent anterior communicating artery. Patent anterior and middle cerebral arteries. No large vessel occlusion, significant stenosis, contrast extravasation or aneurysm. POSTERIOR CIRCULATION: Patent vertebral arteries, vertebrobasilar junction and basilar artery, as well as main branch vessels. Moderate calcific atherosclerosis RIGHT V4 segment resulting in mild stenosis. Luminal irregularity LEFT V4 segment is unchanged, possible dissection or collateral vessels, moderate stenosis mid to distal LEFT V4 segment. Patent posterior cerebral arteries. Robust bilateral posterior communicating arteries. Mild stenosis RIGHT P1 segment. No large vessel occlusion, significant stenosis, contrast extravasation or aneurysm. VENOUS SINUSES: Major dural venous sinuses are patent though not tailored for evaluation on this angiographic examination. ANATOMIC VARIANTS: None. DELAYED PHASE: Not performed. MIP images reviewed. IMPRESSION: CTA neck: 1. No hemodynamically significant stenosis or acute vascular process. 2. 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 3. Similarly occluded proximal LEFT vertebral  artery with reconstitution. 4. Moderate to severe canal stenosis C4-5. Severe neural foraminal narrowing C3-4 and C4-5. CTA head: 1. No emergent large vessel occlusion or flow-limiting stenosis. 2. Moderate stenosis LEFT V4 segment. Aortic Atherosclerosis (ICD10-I70.0). Critical Value/emergent results text paged to [place] via AMION secure system on 07/19/2017 at 3:37 pm, including interpreting physician's phone number. Electronically Signed   By: Elon Alas M.D.   On: 07/19/2017 15:49   Mr Brain Wo Contrast  Result Date: 07/20/2017 CLINICAL DATA:  Initial evaluation for sudden onset dizziness, unsteady gait. EXAM: MRI HEAD WITHOUT CONTRAST TECHNIQUE: Multiplanar, multiecho pulse sequences of the brain and surrounding structures were obtained without intravenous contrast. COMPARISON:  Prior CTA from earlier the same day. FINDINGS: Brain: Generalized age-related cerebral atrophy. Patchy and confluent T2/FLAIR hyperintensity within the periventricular deep white matter both cerebral hemispheres, moderate to advanced in nature. Remote left cerebellar infarct with trace chronic hemosiderin staining. No abnormal foci of restricted diffusion to suggest acute or subacute ischemia. Gray-white matter differentiation maintained. No other areas of remote cortical infarction. No acute or chronic intracranial hemorrhage. No mass lesion, midline shift or mass effect. No hydrocephalus. No extra-axial fluid collection. Major dural sinuses grossly patent. Pituitary gland within normal limits. Vascular: Major intracranial vascular flow voids maintained. Left vertebral artery hypoplastic and not well visualized. Skull and upper cervical spine: Craniocervical junction within normal limits. Degenerative thickening at the tectorial membrane without significant stenosis. Moderate cervical spondylolysis at C3-4 and C4-5 with resultant mild to moderate spinal stenosis. Bone marrow signal intensity within normal limits. No  focal marrow replacing lesion. Scalp soft tissues within normal limits. Sinuses/Orbits: Globes and orbital soft tissues within normal limits. Patient status post lens extraction bilaterally. Moderate mucosal thickening within the right maxillary sinus. Mild mucosal thickening within the right sphenoid sinus. Paranasal sinuses are otherwise largely clear. Small bilateral mastoid effusions, likely sterile/benign. Inner ear structures grossly normal. Other: None. IMPRESSION: 1. No acute intracranial abnormality. 2. 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 Electronically Signed   By: Jeannine Boga M.D.   On: 07/20/2017 00:01   Ct Head Code Stroke Wo Contrast  Addendum Date: 07/19/2017   ADDENDUM REPORT: 07/19/2017 15:37 ADDENDUM: Critical Value/emergent results text paged to [place], Neurology via AMION secure system on 07/19/2017 at 3:32 pm, including interpreting physician's phone number. Electronically Signed  By: Elon Alas M.D.   On: 07/19/2017 15:37   Result Date: 07/19/2017 CLINICAL DATA:  Code stroke. Dizzy and nausea for an hour. History of stroke, hypertension, diabetes, carotid artery disease, pituitary tumor. EXAM: CT HEAD WITHOUT CONTRAST TECHNIQUE: Contiguous axial images were obtained from the base of the skull through the vertex without intravenous contrast. COMPARISON:  CT HEAD Jul 03, 2016. FINDINGS: BRAIN: No intraparenchymal hemorrhage, mass effect nor midline shift. The ventricles and sulci are normal for age. Patchy to confluent supratentorial white matter hypodensities. Old LEFT cerebellar infarct. No acute large vascular territory infarcts. No abnormal extra-axial fluid collections. Basal cisterns are patent. VASCULAR: Moderate calcific atherosclerosis of the carotid siphons. No abnormally dense vessel. SKULL: No skull fracture. 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 No significant scalp soft  tissue swelling. SINUSES/ORBITS: Partially imaged inspissated RIGHT maxillary sinusitis. Included ocular globes and orbital contents are non-suspicious. Status post bilateral ocular lens implants. OTHER: None. ASPECTS [facility] Stroke Program Early CT Score) - Ganglionic level infarction (caudate, lentiform nuclei, internal capsule, insula, M1-M3 cortex): 7 - Supraganglionic infarction (M4-M6 cortex): 3 Total score (0-10 with 10 being normal): 10 IMPRESSION: 1. No acute intracranial process. 2. ASPECTS is 10. 3. 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 Electronically Signed: By: Elon Alas M.D. On: 07/19/2017 15:34    Labs: Basic Metabolic Panel: Recent Labs  Lab 07/19/17 1457 07/19/17 1522 07/20/17 0525 07/21/17 0431 07/23/17 0840  NA 140 139 140 141 141  K 4.0 4.2 4.0 3.9 3.3*  CL 106 105 107 109 106  CO2 24  --  27 23 28   GLUCOSE 131* 129* 88 93 140*  BUN 12 17 11 10 10   CREATININE 1.63* 1.60* 1.55* 1.41* 1.55*  CALCIUM 9.3  --  8.5* 8.3* 8.6*  MG  --   --   --  1.8 1.7   Liver Function Tests: Recent Labs  Lab 07/19/17 1457  AST 31  ALT 25  ALKPHOS 46  BILITOT 1.1  PROT 5.9*  ALBUMIN 3.7   Recent Labs  Lab 07/19/17 1457  LIPASE 38   CBC: Recent Labs  Lab 07/19/17 1457 07/19/17 1522 07/20/17 0525 07/21/17 0431  WBC 9.1  --  7.2 6.2  NEUTROABS 6.4  --   --   --   HGB 12.5* 12.2* 11.2* 10.7*  HCT 37.3* 36.0* 34.1* 32.9*  MCV 98.2  --  98.0 100.0  PLT 188  --  139* 136*    CBG: Recent Labs  Lab 07/22/17 1203 07/22/17 1638 07/22/17 2018 07/23/17 0629 07/23/17 1201  GLUCAP 118* 132* 206* 89 116*    Principal Problem:   TIA (transient ischemic attack) Active Problems:   Chronic myeloid leukemia in remission ([place])   Essential hypertension   Hearing loss   Diabetes mellitus type 2, controlled ([place])   History of pulmonary embolism   History of DVT (deep vein thrombosis)   Ocular myasthenia  (HCC)   Chronic diastolic CHF (congestive heart failure) (HCC)   Thrombocytopenia (HCC)   Ataxia   Time coordinating discharge: 35 minutes  Signed:  Murray Hodgkins, MD [facility] 07/23/2017, 2:18 PM

## 2017-07-23 NOTE — Progress Notes (Signed)
Occupational Therapy Assessment and Plan  Patient Details  Name: [name] MRN: [id number] Date of Birth: 1940/12/01  OT Diagnosis: ataxia, hemiplegia affecting dominant side and muscle weakness (generalized) Rehab Potential: Rehab Potential (ACUTE ONLY): Excellent ELOS: 10-14 days   Today's Date: 07/24/2017 OT Individual Time: 3267-1245 OT Individual Time Calculation (min): 73 min     Problem List:  Patient Active Problem List   Diagnosis Date Noted  . Transient ischemic attack (TIA) 07/23/2017  . Subcortical infarction ([place])   . Right hemiparesis ([place])   . Ataxia 07/21/2017  . TIA (transient ischemic attack)   . Thrombocytopenia ([place])   . Wheezing 06/17/2017  . Bursitis of left elbow 05/01/2017  . Dystonia 12/29/2016  . PAD (peripheral artery disease) ([place]) 11/18/2016  . Osteoporosis 08/28/2016  . Paronychia of great toe, left 08/10/2016  . Carotid arterial disease ([place]) 07/09/2016  . Hypocalcemia 07/09/2016  . Diplopia 07/02/2016  . S/P lumbar laminectomy 03/11/2016  . Radicular pain of left lower extremity 02/21/2016  . Cough 01/10/2016  . 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 Myasthenia gravis ([place])   . Diabetes mellitus type 2 in nonobese (HCC)   . Coronary artery disease involving native coronary artery of native heart without angina pectoris   . Acute blood loss anemia   . Ataxia, post-stroke   . Gait disturbance, post-stroke   . Proximal leg weakness   . Cervical spondylosis without myelopathy   . Achilles tendinitis of right lower extremity   . Chronic diastolic CHF (congestive heart failure) ([place])   . Gastroesophageal reflux disease without esophagitis   . Chronic kidney disease   . Cerebral infarction involving left cerebellar artery ([place]) 06/20/2015  . Cerebellar stroke ([place])   . Ischemic stroke ([place])   . Cerebrovascular accident (CVA) due to thrombosis of left vertebral artery ([place])   . History of pulmonary embolus (PE)   .  Chronic anticoagulation   . HLD (hyperlipidemia)   . Neck pain 03/19/2015  . Pain in the chest   . 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[place])   . Chronic diastolic heart failure, NYHA class 1 ([place]) 03/07/2015  . Diabetic gastroparesis ([place]) 03/07/2015  . Screening for osteoporosis 07/19/2014  . Edema 04/09/2014  . Routine general medical examination at a health care facility 10/23/2013  . Bilateral carotid bruits 05/24/2013  . Abnormal CT scan, lung 05/24/2013  . Encounter for therapeutic drug monitoring 03/14/2013  . Lymphadenopathy, inguinal 01/11/2013  . AKI (acute kidney injury) ([place]) 10/28/2012  . 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 Myasthenia gravis with exacerbation ([place]) 03/31/2012  . Disease of salivary gland 12/09/2011  . Left facial pain 10/28/2011  . Pulmonary embolism ([place]) 10/25/2011  . 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 GERD with stricture 09/23/2011  . Long term (current) use of anticoagulants 08/03/2011  . History of pulmonary embolism 07/30/2011  . History of DVT (deep vein thrombosis) 07/30/2011  . Subconjunctival hemorrhage 06/22/2011  . Bilateral conjunctivitis 04/09/2011  . Diabetes mellitus type 2, controlled ([place]) 12/18/2010  . Hearing loss 08/04/2010  . Ptosis of eyelid 12/30/2009  . Essential hypertension 04/24/2009  . HYPERLIPIDEMIA, MIXED 10/05/2008  . HEMORRHOIDS-INTERNAL 07/02/2008  . HEMORRHOIDS-EXTERNAL 07/02/2008  . DIVERTICULOSIS-COLON 07/02/2008  . PERSONAL HX COLONIC POLYPS 07/02/2008  . RASH-NONVESICULAR 02/07/2008  . PNEUMONIA 12/08/2007  . 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 CAD (coronary artery disease) 07/07/2007  . NEPHROLITHIASIS, HX OF 07/07/2007  Past Medical History:  Past Medical History:  Diagnosis Date  . Bruises easily    d/t being on Eliquis  . Carotid artery disease ([place])    a. Duplex 05/2014: 61-53% RICA, 7-94% LICA, elevated velocities in right subclavian  artery, normal left subclavian artery..  . Chronic back pain    HNP  . Chronic kidney disease (CKD)   . Claustrophobia    takes Ativan as needed  . 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 Coronary atherosclerosis of unspecified type of vessel, native or graft    a. cath in 2003 showed 10-20% LM, scattered 20% prox-mid LAD, 30% more distal LAD, 50-60% stenosis of LAD towards apex, 20% Cx, 30% dRCA, focal 95% stenosis in smaller of 2 branches of PDA, EF 60-65%.  . Diabetes mellitus ([place])   . Diarrhea    takes Imodium daily as needed  . Diverticulosis   . Diverticulosis of colon (without mention of hemorrhage)   . DJD (degenerative joint disease)   . DVT (deep venous thrombosis) ([place]) 07/2011  . Esophageal stricture   . Essential hypertension    takes Imdur and Lisinopril daily  . Gastric polyps    benign  . GERD (gastroesophageal reflux disease)    takes Nexium daily  . Glaucoma    uses eye drops  . History of bronchitis    not sure when the last time  . History of colon polyps    benign  . History of kidney stones   . History of kidney stones   . Insomnia    takes Melatonin nightly as needed  . Mixed hyperlipidemia    takes Crestor daily  . Myasthenia gravis ([place])    uses prednisone  . Osteoporosis 2016  . Peripheral edema    takes Lasix daily  . Pituitary tumor    checks Dr[facility]Walden at [facility] checks it every Jan   . Pneumonia 01/2016  . PONV (postoperative nausea and vomiting)   . Ptosis   . Pulmonary embolus ([place]) 2012  . Stroke [facility]) 06/2015   Past Surgical History:  Past Surgical History:  Procedure Laterality Date  . ABDOMINAL AORTOGRAM W/LOWER EXTREMITY N/A 11/18/2016   Procedure: ABDOMINAL AORTOGRAM W/LOWER EXTREMITY;  Surgeon: [name], MD;  Location: [place] CV LAB;  Service: Cardiovascular;  Laterality: N/A;  . BACK SURGERY     x2, lumbar and cervical  . CARDIAC CATHETERIZATION    . CATARACT EXTRACTION Bilateral  12/12  . COLONOSCOPY    . ESOPHAGOGASTRODUODENOSCOPY ENDOSCOPY  04/2015  . IR GENERIC HISTORICAL  03/03/2016   IR IVC FILTER PLMT / S&I [name] GUID/MOD SED 03/03/2016 [name], MD [facility]-INTER[facility] RAD  . IR GENERIC HISTORICAL  04/09/2016   IR RADIOLOGIST EVAL & MGMT 04/09/2016 [name], MD [facility] INTERV RAD  . IR GENERIC HISTORICAL  04/30/2016   IR IVC FILTER RETRIEVAL / S&I [name] GUID/MOD SED 04/30/2016 [name], MD [facility]-INTERV RAD  . LITHOTRIPSY    . LUMBAR LAMINECTOMY/DECOMPRESSION MICRODISCECTOMY Right 09/14/2012   Procedure: Right Lumbar three-four, four-five, Lumbar five-Sacral one decompressive laminectomy;  Surgeon: [name], MD;  Location: [place] [facility] NEURO ORS;  Service: Neurosurgery;  Laterality: Right;  . LUMBAR LAMINECTOMY/DECOMPRESSION MICRODISCECTOMY Left 03/11/2016   Procedure: Left Lumbar Two-ThreeMicrodiscectomy;  Surgeon: [name], MD;  Location: [place];  Service: Neurosurgery;  Laterality: Left;  . PERIPHERAL VASCULAR INTERVENTION  11/18/2016   Procedure: PERIPHERAL VASCULAR INTERVENTION;  Surgeon: [name], MD;  Location[facility]  INVASIVE CV LAB;  Service: Cardiovascular;;  Left popliteal  . SHOULDER SURGERY Left 05/07/2009  . TONSILLECTOMY      Assessment & Plan Clinical Impression: Patient is a 77 y.o. year old male with history of T2DM, MG, PE, PAD, CKD, CML, cerebellar stroke who was admitted on 07/19/17 with sudden onset of N/V and difficulty walking. CT head was negative for bleed but TPA not given as patient had taken Eliquis that a.m. 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 MRI brain was negative for acute intracranial process. And showed chronic moderate to advanced small vessel disease. 2D echo done showing EF of 50 to 55% with mild ABS. Dr. Leonie Man felt that patient had "TIA or recrudescence of previous stroke symptoms from failure of collateral  circulation to occluded left vertebral artery and should improve with time". He recommended continuing Eliquis, avoiding dehydration and hypotension. Patient transferred to CIR on 07/23/2017 .    Patient currently requires min with basic self-care skills secondary to muscle weakness, impaired timing and sequencing, unbalanced muscle activation, ataxia, decreased coordination and decreased motor planning, decreased motor planning, decreased initiation, decreased awareness, decreased problem solving, decreased safety awareness, decreased memory and delayed processing and decreased sitting balance, decreased standing balance, decreased postural control, hemiplegia and decreased balance strategies.  Prior to hospitalization, patient could complete BADL with modified independent .  Patient will benefit from skilled intervention to increase independence with basic self-care skills prior to discharge home with care partner.  Anticipate patient will require 24 hour supervision and follow up home health.  OT - End of Session Endurance Deficit: Yes Endurance Deficit Description: Multiple rest breaks within BADL tasks OT Assessment Rehab Potential (ACUTE ONLY): Excellent OT Patient demonstrates impairments in the following area(s): Balance;Cognition;Endurance;Motor;Safety OT Basic ADL's Functional Problem(s): Grooming;Dressing;Bathing;Toileting OT Transfers Functional Problem(s): Toilet;Tub/Shower OT Additional Impairment(s): Fuctional Use of Upper Extremity OT Plan OT Intensity: Minimum of 1-2 x/day, 45 to 90 minutes OT Frequency: 5 out of 7 days OT Duration/Estimated Length of Stay: 10-14 days OT Treatment/Interventions: Balance/vestibular training;Cognitive remediation/compensation;Community reintegration;Discharge planning;UE/LE Coordination activities;UE/LE Strength taining/ROM;Therapeutic Exercise;Therapeutic Activities;Self Care/advanced ADL retraining;Patient/family education;Neuromuscular  re-education;Functional mobility training;DME/adaptive equipment instruction OT Basic Self-Care Anticipated Outcome(s): Supervision OT Toileting Anticipated Outcome(s): Supervision OT Bathroom Transfers Anticipated Outcome(s): Supervision OT Recommendation Recommendations for Other Services: Therapeutic Recreation consult Therapeutic Recreation Interventions: Other (comment)(Enjoyed playing golf and being active) Patient destination: Home Follow Up Recommendations: Home health OT Equipment Recommended: To be determined   Skilled Therapeutic Intervention OT eval completed addressing rehab process, OT purpose, POC, ELOS, and goals. OT treatment provided focused on balance, safety awareness, and modified bathing/dressing. Pt ambulated into bathroom with mod HHA. Scissoring noted with frequent stoop and start within ambulation. Pt able to wash all body parts but needed min/mod A for balance when standing 2/2 posterior LOB when washing buttocks. Pt also needed verbal cues throughout shower not to stand up as pt is very impulsive and has very poor awareness of his deficits. Ambulated out of shower in similar fashion and needed multimodal cues to sit down to don pants for safety. Although pt was able to complete most steps of dressing, he needed min/mod A for dynamic balance. Pt stated "I really think I could go home." Educated pt on functional deficits and his extremely high risk of falling right now, pt verbalized understanding. Grooming tasks completed at the sink with pt sitting down to shave for safety. Pt left seated  in wc at end of session with chair alarm and safety belt on.   OT Evaluation Precautions/Restrictions  Precautions Precautions: Fall Restrictions Weight Bearing Restrictions: No Pain Pain Assessment Pain Scale: 0-10 Pain Score: 0-No pain Home Living/Prior Functioning Home Living Family/patient expects to be discharged to:: Unsure Living Arrangements: Spouse/significant  other Available Help at Discharge: Family Type of Home: House Home Access: Stairs to enter Technical brewer of Steps: 3 Entrance Stairs-Rails: Right, Left Home Layout: Two level, Bed/bath upstairs Alternate Level Stairs-Number of Steps: 9 Alternate Level Stairs-Rails: Left Bathroom Shower/Tub: Estate agent Accessibility: Yes Additional Comments: Walk-in shower with built in bench  Lives With: Spouse IADL History Current License: Yes Prior Function Driving: Yes Comments: Enjoys getting out and visiting family, used to enjoy golfing ADL ADL ADL Comments: Please see functional navigator Vision Baseline Vision/History: (Pt currently has an eye infectin, but no neurological changs) Patient Visual Report: No change from baseline Perception  Perception: Within Functional Limits Praxis Praxis: Intact Cognition Arousal/Alertness: Awake/alert Orientation Level: Person;Situation;Place Person: Oriented Place: Oriented Situation: Oriented Year: 2019 Month: June Day of Week: Correct Memory: Impaired Immediate Memory Recall: Sock;Bed;Blue Memory Recall: Blue Memory Recall Blue: Without Cue Awareness: Impaired Behaviors: Impulsive Sensation Sensation Light Touch: Appears Intact Hot/Cold: Appears Intact Coordination Gross Motor Movements are Fluid and Coordinated: No Fine Motor Movements are Fluid and Coordinated: No Coordination and Movement Description: Decreased smoothness and accuracy Finger Nose Finger Test: slight dysmetria  Motor  Motor Motor: Ataxia Mobility  Transfers Sit to Stand: Contact Guard/Touching assist Stand to Sit: Contact Guard/Touching assist  Trunk/Postural Assessment     Balance Balance Balance Assessed: Yes Static Sitting Balance Static Sitting - Level of Assistance: 6: Modified independent (Device/Increase time) Dynamic Sitting Balance Dynamic Sitting - Balance Support: No upper extremity  supported Dynamic Sitting - Level of Assistance: 5: Stand by assistance Static Standing Balance Static Standing - Balance Support: During functional activity Static Standing - Level of Assistance: 4: Min assist Dynamic Standing Balance Dynamic Standing - Balance Support: During functional activity Dynamic Standing - Level of Assistance: 3: Mod assist Extremity/Trunk Assessment RUE Assessment RUE Assessment: Exceptions to [facility] General Strength Comments: R side slightly weaker than L, ataxia and BUE tremors more limiting LUE Assessment LUE Assessment: Within Functional Limits General Strength Comments: Generalized weakness   See Function Navigator for Current Functional Status.   Refer to Care Plan for Long Term Goals  Recommendations for other services: Therapeutic Recreation  Other Pt was an active golfer, enjoys gardening   Discharge Criteria: Patient will be discharged from OT if patient refuses treatment 3 consecutive times without medical reason, if treatment goals not met, if there is a change in medical status, if patient makes no progress towards goals or if patient is discharged from hospital.  The above assessment, treatment plan, treatment alternatives and goals were discussed and mutually agreed upon: by patient  Valma Cava 07/24/2017, 9:18 AM

## 2017-07-23 NOTE — H&P (Signed)
Physical Medicine and Rehabilitation Admission H&P    Chief Complaint  Patient presents with  .     HPI: [name] is a 77 year old male with history of T2DM, MG, PE, PAD, CKD, CML, cerebellar stroke who was admitted on 07/19/17 with sudden onset of N/V and difficulty walking.  CT head was negative for bleed but TPA not given as patient had taken Eliquis that a.m.  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  MRI brain was negative for acute intracranial process.  And showed chronic moderate to advanced small vessel disease.  2D echo done showing EF of 50 to 55% with mild ABS.  Dr. 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  He recommended continuing Eliquis, avoiding  dehydration and hypotension.  Patient continues to be limited by bilateral lower extremity weakness with ataxia and instability he has had decline in functional status that was CIR was recommended for follow-up therapy    Review of Systems  Constitutional: Negative for chills and fever.  HENT: Positive for hearing loss and tinnitus (chronic).   Eyes: Negative for blurred vision and double vision.       Left eye with itching/burning for the past 3-4 weeks.   Respiratory: Negative for cough and shortness of breath.   Cardiovascular: Negative for chest pain and palpitations.  Gastrointestinal: Positive for heartburn. Negative for nausea.  Genitourinary: Positive for frequency. Negative for dysuria.  Musculoskeletal: Negative for joint pain and myalgias.  Neurological: Positive for dizziness. Negative for sensory change and speech change.    Past Medical History:  Diagnosis Date  . Bruises easily    d/t being on Eliquis  . Carotid artery disease ([place])    a. Duplex  05/2014: 71-06% RICA, 2-69% LICA, elevated velocities in right subclavian artery, normal left subclavian artery..  . Chronic back pain    HNP  . Chronic kidney disease (CKD)   . Claustrophobia    takes Ativan as needed  . 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 Coronary atherosclerosis of unspecified type of vessel, native or graft    a. cath in 2003 showed 10-20% LM, scattered 20% prox-mid LAD, 30% more distal LAD, 50-60% stenosis of LAD towards apex, 20% Cx, 30% dRCA, focal 95% stenosis in smaller of 2 branches of PDA, EF 60-65%.  . Diabetes mellitus ([place])   . Diarrhea    takes Imodium daily as needed  . Diverticulosis   . Diverticulosis of colon (without mention of hemorrhage)   . DJD (degenerative joint disease)   . DVT (deep venous thrombosis) ([place]) 07/2011  . Esophageal stricture   . Essential hypertension    takes Imdur and Lisinopril daily  . Gastric polyps    benign  . GERD (gastroesophageal reflux disease)    takes Nexium daily  . Glaucoma    uses eye drops  . History of bronchitis    not sure when the last time  . History of colon polyps    benign  . History of kidney stones   . History of kidney stones   . Insomnia    takes Melatonin nightly as needed  . Mixed hyperlipidemia    takes Crestor daily  . Myasthenia gravis ([place])    uses prednisone  .  Osteoporosis 2016  . Peripheral edema    takes Lasix daily  . Pituitary tumor    [facility]Walden at [facility] checks it every Jan   . Pneumonia 01/2016  . PONV (postoperative nausea and vomiting)   . Ptosis   . Pulmonary embolus ([place]) 2012  . Stroke [facility]) 06/2015    Past Surgical History:  Procedure Laterality Date  . ABDOMINAL AORTOGRAM W/LOWER EXTREMITY N/A 11/18/2016   Procedure: ABDOMINAL AORTOGRAM W/LOWER EXTREMITY;  Surgeon: [name], MD;  Location: [place] [facility];  Service: Cardiovascular;  Laterality: N/A;  . BACK SURGERY     x2, lumbar and cervical  . CARDIAC  CATHETERIZATION    . CATARACT EXTRACTION Bilateral 12/12  . COLONOSCOPY    . ESOPHAGOGASTRODUODENOSCOPY ENDOSCOPY  04/2015  . IR GENERIC HISTORICAL  03/03/2016   IR IVC FILTER PLMT / S&I [name] GUID/MOD SED 03/03/2016 [name], MD [facility]INTER[facility] RAD  . IR GENERIC HISTORICAL  04/09/2016   IR RADIOLOGIST EVAL & MGMT 04/09/2016 [name], MD [facility] INTER[facility] RAD  . IR GENERIC HISTORICAL  04/30/2016   IR IVC FILTER RETRIEVAL / S&I [name] GUID/MOD SED 04/30/2016 [name], MD [facility]-INTERV RAD  . LITHOTRIPSY    . LUMBAR LAMINECTOMY/DECOMPRESSION MICRODISCECTOMY Right 09/14/2012   Procedure: Right Lumbar three-four, four-five, Lumbar five-Sacral one decompressive laminectomy;  Surgeon: [name], MD;  Location: [place] [facility];  Service: Neurosurgery;  Laterality: Right;  . LUMBAR LAMINECTOMY/DECOMPRESSION MICRODISCECTOMY Left 03/11/2016   Procedure: Left Lumbar Two-ThreeMicrodiscectomy;  Surgeon: [name], MD;  Location: [place];  Service: Neurosurgery;  Laterality: Left;  . PERIPHERAL VASCULAR INTERVENTION  11/18/2016   Procedure: PERIPHERAL VASCULAR INTERVENTION;  Surgeon: [name], MD;  Location: [place] [facility];  Service: Cardiovascular;;  Left popliteal  . SHOULDER SURGERY Left 05/07/2009  . TONSILLECTOMY      Family History  Problem Relation Age of Onset  . Heart disease Mother   . Heart attack Mother   . Stroke Mother   . Heart disease Father   . Heart attack Father   . Stroke Father   . Breast cancer Sister        Twin   . Heart attack Sister   . Dementia Sister   . Heart disease Sister   . Heart attack Sister   . Clotting disorder Sister   . Heart attack Sister   . Colon cancer Neg Hx     Social History:  Married. Retired Freight forwarder. Independent PTA without AD. He reports that he has never smoked. He has never used smokeless tobacco. He reports that he does not drink alcohol or use drugs.    Allergies  Allergen Reactions  . Phenergan [Promethazine Hcl] Swelling and  Other (See Comments)    Cannot be combined with Zofran because swelling of the eyes resulted and patient became very restless-  . Zofran [Ondansetron Hcl] Swelling and Other (See Comments)    Cannot be combined with Phenergan because swelling of the eyes resulted and patient became very restless-  . Sulfamethoxazole Rash and Itching    Medications Prior to Admission  Medication Sig Dispense Refill  . brimonidine-timolol (COMBIGAN) 0.2-0.5 % ophthalmic solution Place 1 drop into both eyes every 12 (twelve) hours.    . cholecalciferol (VITAMIN D) 1000 units tablet Take 1,000 Units by mouth daily.     Marland Kitchen ELIQUIS 5 MG TABS tablet TAKE 1 TABLET (5 MG TOTAL) BY MOUTH 2 (TWO) TIMES DAILY. 60 tablet 3  . furosemide (LASIX)  20 MG tablet TAKE 1 TABLET BY MOUTH DAILY. 90 tablet 2  . imatinib (GLEEVEC) 100 MG tablet Take 300 mg by mouth daily at 12 noon. Take with meals and large glass of water.Caution:Chemotherapy     . isosorbide mononitrate (IMDUR) 30 MG 24 hr tablet Take 1 tablet (30 mg total) by mouth daily. 90 tablet 3  . lidocaine (LIDODERM) 5 % Place 1 patch onto the skin daily as needed (for back pain). Remove & Discard patch within 12 hours or as directed by MD     . lisinopril (PRINIVIL,ZESTRIL) 5 MG tablet Take 1 tablet (5 mg total) by mouth daily. 30 tablet 11  . loperamide (IMODIUM) 2 MG capsule Take 2-4 mg by mouth 3 (three) times daily as needed for diarrhea or loose stools.     Marland Kitchen LORazepam (ATIVAN) 0.5 MG tablet TAKE 1 TABLET BY MOUTH AT BEDTIME AS NEEDED FOR SLEEP 30 tablet 3  . Melatonin 3 MG TABS Take 3 mg by mouth at bedtime as needed (for sleep).     . Multiple Vitamin (MULTIVITAMIN) tablet Take 1 tablet by mouth daily.    . ondansetron (ZOFRAN ODT) 8 MG disintegrating tablet Take 1 tablet (8 mg total) by mouth every 8 (eight) hours as needed for nausea or vomiting. 90 tablet 1  . predniSONE (DELTASONE) 1 MG tablet Take 3 tablets (3 mg total) by mouth daily with breakfast. 270 tablet 1   . pyridostigmine (MESTINON) 60 MG tablet Take 60 mg by mouth daily.    . ranitidine (ZANTAC) 150 MG tablet Take 150 mg by mouth 2 (two) times daily as needed for heartburn.    . rosuvastatin (CRESTOR) 10 MG tablet Take 5 mg by mouth daily.    Marland Kitchen tobramycin (TOBREX) 0.3 % ophthalmic solution Place 1 drop into both eyes 4 (four) times daily. FOR 3 WEEKS  3  . traMADol (ULTRAM) 50 MG tablet Take 2 tablets (100 mg total) by mouth every 8 (eight) hours. (Patient taking differently: Take 50 mg by mouth every 8 (eight) hours as needed (for pain). ) 15 tablet 0  . amoxicillin-clavulanate (AUGMENTIN) 875-125 MG tablet Take 1 tablet by mouth 2 (two) times daily. One po bid x 7 days (Patient not taking: Reported on 07/19/2017) 14 tablet 0  . Fluticasone-Salmeterol (ADVAIR DISKUS) 100-50 MCG/DOSE AEPB Inhale 1 puff into the lungs 2 (two) times daily. (Patient not taking: Reported on 07/19/2017) 1 each 1  . NITROSTAT 0.4 MG SL tablet PLACE 1 TABLET UNDER THE TONGUE EVERY 5 MINUTES X3 DOSES AS NEEDED FOR CHEST PAIN (Patient not taking: Reported on 07/19/2017) 100 tablet 1  . predniSONE (DELTASONE) 20 MG tablet Take 1 tablet (20 mg total) by mouth 2 (two) times daily. (Patient not taking: Reported on 07/19/2017) 10 tablet 0  . promethazine-codeine (PHENERGAN WITH CODEINE) 6.25-10 MG/5ML syrup Take 2.5 mLs by mouth every 4 (four) hours as needed for cough. (Patient not taking: Reported on 07/19/2017) 120 mL 0    Drug Regimen Review  Drug regimen was reviewed and remains appropriate with no significant issues identified  Home: Home Living Family/patient expects to be discharged to:: Private residence Living Arrangements: Spouse/significant other Available Help at Discharge: Family Type of Home: House Home Access: Stairs to enter Technical brewer of Steps: 3 Entrance Stairs-Rails: Right, Left Home Layout: Two level, Bed/bath upstairs Bathroom Shower/Tub: Multimedia programmer: Standard Bathroom  Accessibility: Yes Home Equipment: Shower seat - built in  Lives With: Spouse   Functional History: Prior  Function Level of Independence: Independent  Functional Status:  Mobility: Bed Mobility Overal bed mobility: Needs Assistance Bed Mobility: Rolling, Supine to Sit, Sit to Supine Rolling: Supervision Supine to sit: Min assist Sit to supine: Min assist General bed mobility comments: pt sitting EOB upon arrival with family present Transfers Overall transfer level: Needs assistance Equipment used: 1 person hand held assist Transfers: Sit to/from Stand Sit to Stand: Min assist General transfer comment: several attempts to stand without assistance however pt unable to come into standing; assistance to rise and to gain balance upon standing;pt bracing himself against EOB Ambulation/Gait Ambulation/Gait assistance: Min assist, Max assist(min-max A required) Gait Distance (Feet): 130 Feet Assistive device: None(assist with gait belt at trunk ) Gait Pattern/deviations: Ataxic, Narrow base of support, Staggering right, Staggering left General Gait Details: pt presents with ataxia and mulitple LOB requiring assistance to recover; pt pauses frequently during gait to regain balance; pt cannot tolerate challenges to gait including horizontal (especially to L) and vertical head turns; pt with scissoring when turning to L side but smoother turn going toward R side Gait velocity: decreased    ADL: ADL Overall ADL's : Needs assistance/impaired Grooming: Moderate assistance, Standing, Oral care, Wash/dry face, Wash/dry hands Grooming Details (indicate cue type and reason): pt with posterior lean. pt noted to have toes off the ground and posterior lean without awareness. pt does not attempt to self correct until therapist is already aiding with phsycial assistance. pt bracing on all surfaces with sit<>Stand transfer. pt does not recognize this and actually states "i think i am stronger and better  today"  Lower Body Bathing: Moderate assistance, Sit to/from stand Toilet Transfer: Minimal assistance, RW, BSC Toilet Transfer Details (indicate cue type and reason): pt with less (A) requires with bathroom transfer with use of RW and but patient demonstrates safety errors with RW . pt walking outside of the frame of the RW. pt pushing RW to the side and attempting to reach to the lateral side of RW. pt needs mod cues for RW safety. pt again demonstrates posterior LOB with correction by therapist. pt attempting to exit the bathroom without (A).  Functional mobility during ADLs: Moderate assistance General ADL Comments: pt eager to get OOB and reports great night sleep. pt complete adl task at the sink demonstrating posterior LOB and decr fine/ gross motor . pt with tremor incr in intensity with movement for adl task. pt with lack of awareness. OT transfering patient without RW to assess balance further this session and bil LE buckle once and x2 LLE buckle. pt seems not alarmed by the event and reports feeling that he is better at this time.   Cognition: Cognition Overall Cognitive Status: Impaired/Different from baseline Arousal/Alertness: Awake/alert Orientation Level: Oriented X4 Attention: Alternating Alternating Attention: Appears intact Memory: Impaired Memory Impairment: Storage deficit, Retrieval deficit Awareness: Impaired Awareness Impairment: Emergent impairment Problem Solving: (tba) Safety/Judgment: Other (comment) Comments: answered questions appropriately per testing; functional deficits noted per OT/PT Cognition Arousal/Alertness: Awake/alert Behavior During Therapy: WFL for tasks assessed/performed Overall Cognitive Status: Impaired/Different from baseline Area of Impairment: Safety/judgement, Awareness Safety/Judgement: Decreased awareness of deficits, Decreased awareness of safety Awareness: Emergent General Comments: decreased awareness of deficits thorughout  session   Blood pressure (!) 142/59, pulse 74, temperature 97.8 F (36.6 C), temperature source Oral, resp. rate 17, height '5\' 7"'$  (1.702 m), weight 80.1 kg (176 lb 9.4 oz), SpO2 99 %. Physical Exam  Nursing note and vitals reviewed. Constitutional: He appears well-developed and well-nourished. No distress.  HENT:  Head: Normocephalic.  Eyes: Pupils are equal, round, and reactive to light. Left conjunctiva is injected.  Bilateral scleral irritation  Neck: Normal range of motion. No tracheal deviation present. No thyromegaly present.  Cardiovascular: Normal rate and regular rhythm. Exam reveals no friction rub.  No murmur heard. Respiratory: Effort normal and breath sounds normal. No respiratory distress. He has no wheezes. He has no rales.  GI: Soft. Bowel sounds are normal. He exhibits no distension. There is no tenderness. There is no rebound.  Musculoskeletal: Normal range of motion. He exhibits no edema or deformity.  Neurological:  3-4 beats of nystagmus left eye with left lateral gaze. Not symptomatic. Cognitively appropriate. No resting tremors, speech generally clear. RUE 4/5 prox to distal. RLE 4- to 4/5 prox to distal. LUE and LLE 5/5. Normal sensation bilateral upper and lower extremities.   Skin: Skin is warm.  Psychiatric: He has a normal mood and affect. His behavior is normal. Judgment and thought content normal.    Results for orders placed or performed during the hospital encounter of 07/19/17 (from the past 48 hour(s))  Glucose, capillary     Status: Abnormal   Collection Time: 07/21/17  4:10 PM  Result Value Ref Range   Glucose-Capillary 137 (H) 65 - 99 mg/dL   Comment 1 Notify RN    Comment 2 Document in Chart   Glucose, capillary     Status: Abnormal   Collection Time: 07/21/17  9:03 PM  Result Value Ref Range   Glucose-Capillary 161 (H) 65 - 99 mg/dL   Comment 1 Notify RN    Comment 2 Document in Chart   Glucose, capillary     Status: Abnormal   Collection  Time: 07/22/17  6:29 AM  Result Value Ref Range   Glucose-Capillary 116 (H) 65 - 99 mg/dL   Comment 1 Notify RN    Comment 2 Document in Chart   Glucose, capillary     Status: Abnormal   Collection Time: 07/22/17 12:03 PM  Result Value Ref Range   Glucose-Capillary 118 (H) 65 - 99 mg/dL  Glucose, capillary     Status: Abnormal   Collection Time: 07/22/17  4:38 PM  Result Value Ref Range   Glucose-Capillary 132 (H) 65 - 99 mg/dL  Glucose, capillary     Status: Abnormal   Collection Time: 07/22/17  8:18 PM  Result Value Ref Range   Glucose-Capillary 206 (H) 65 - 99 mg/dL  Glucose, capillary     Status: None   Collection Time: 07/23/17  6:29 AM  Result Value Ref Range   Glucose-Capillary 89 65 - 99 mg/dL  Basic metabolic panel     Status: Abnormal   Collection Time: 07/23/17  8:40 AM  Result Value Ref Range   Sodium 141 135 - 145 mmol/L   Potassium 3.3 (L) 3.5 - 5.1 mmol/L   Chloride 106 101 - 111 mmol/L   CO2 28 22 - 32 mmol/L   Glucose, Bld 140 (H) 65 - 99 mg/dL   BUN 10 6 - 20 mg/dL   Creatinine, Ser 1.55 (H) 0.61 - 1.24 mg/dL   Calcium 8.6 (L) 8.9 - 10.3 mg/dL   GFR calc non Af Amer 41 (L) >60 mL/min   GFR calc Af Amer 47 (L) >60 mL/min    Comment: (NOTE) The eGFR has been calculated using the CKD EPI equation. This calculation has not been validated in all clinical situations. eGFR's persistently <60 mL/min signify possible Chronic Kidney Disease.  Anion gap 7 5 - 15    Comment: Performed at [facility] [address], [place], [place] [address]  Magnesium     Status: None   Collection Time: 07/23/17  8:40 AM  Result Value Ref Range   Magnesium 1.7 1.7 - 2.4 mg/dL    Comment: Performed at [facility] [address], [place], [place] [address]   No results found.     Medical Problem List and Plan: 1.  Right hemiparesis and functional deficits secondary to ?left subcortical infarct, hx of myasthenia gravis.   -admit to inpatient rehab 2.  DVT  Prophylaxis/Anticoagulation: Pharmaceutical: Other (comment)--Eliquis 3. Pain Management: tylenol prn.  4. Mood: LCSW to follow for evaluation and support.  5. Neuropsych: This patient is capable of making decisions on his own behalf. 6. Skin/Wound Care: Routine pressure relief measures.  7. Fluids/Electrolytes/Nutrition: Monitor I/O. Check lytes in am. 8. MG: On Mestinon and prednisone.  9. T2DM: Monitor BS ac/hs 10. CML:  In remission on Gleevec 11. GERD: Resume Zantac bid.  12. HTN: Monitor BP bid--continue Imdur.  13. CAD : Monitor for symptoms with activity. On Imdur and Crestor.   14. Glaucoma: Managed with Timolol and Alphagan bid.   -on tobrex eye gtts recently for recent infection  -?steroid gtts for inflammation (already receiving prednisone PO)   Post Admission Physician Evaluation: 1. Functional deficits secondary  to left subcortical infarct, hx of MG. 2. Patient is admitted to receive collaborative, interdisciplinary care between the physiatrist, rehab nursing staff, and therapy team. 3. Patient's level of medical complexity and substantial therapy needs in context of that medical necessity cannot be provided at a lesser intensity of care such as a SNF. 4. Patient has experienced substantial functional loss from his/her baseline which was documented above under the "Functional History" and "Functional Status" headings.  Judging by the patient's diagnosis, physical exam, and functional history, the patient has potential for functional progress which will result in measurable gains while on inpatient rehab.  These gains will be of substantial and practical use upon discharge  in facilitating mobility and self-care at the household level. 5. Physiatrist will provide 24 hour management of medical needs as well as oversight of the therapy plan/treatment and provide guidance as appropriate regarding the interaction of the two. 6. The Preadmission Screening has been reviewed and patient  status is unchanged unless otherwise stated above. 7. 24 hour rehab nursing will assist with bladder management, bowel management, safety, skin/wound care, disease management, medication administration, pain management and patient education  and help integrate therapy concepts, techniques,education, etc. 8. PT will assess and treat for/with: Lower extremity strength, range of motion, stamina, balance, functional mobility, safety, adaptive techniques and equipment, NMR, family education.   Goals are: supervision. 9. OT will assess and treat for/with: ADL's, functional mobility, safety, upper extremity strength, adaptive techniques and equipment, NMR, family ed.   Goals are: supervision. Therapy may proceed with showering this patient. 10. SLP will assess and treat for/with: n/a.  Goals are: n/a. 11. Case Management and Social Worker will assess and treat for psychological issues and discharge planning. 12. Team conference will be held weekly to assess progress toward goals and to determine barriers to discharge. 13. Patient will receive at least 3 hours of therapy per day at least 5 days per week. 14. ELOS: 7-10 days       15. Prognosis:  excellent   I have personally performed a face to face diagnostic evaluation of  this patient and formulated the key components of the plan.  Additionally, I have personally reviewed laboratory data, imaging studies, as well as relevant notes and concur with the physician assistant's documentation above.  Meredith Staggers, MD, Mellody Drown     Bary Leriche, PA-C 07/23/2017

## 2017-07-23 NOTE — Consult Note (Signed)
[facility] CM Primary Care Navigator  07/23/2017  [place] [birth year] [id number]   Met with patient, wife Hassan Rowan) and daughter at the bedside to identify possible discharge needs. Patientreports having"dizziness, nausea and vomiting" thatresultedtothis admission.(Posterior circulation TIA/ recrudescence of previous stroke symptoms)  Patientendorses Dr.Sean Loanne Drilling with [facility] as hisprimary care provider for years (even before Dr. Loanne Drilling had his specialty) and patient refused to have a separate primary care provider when offered.    [facility] [place] and [place] [facility] obtain [facility] withoutdifficulty.  Patientreports thathehas beenmanaginghis own medications at [facility] use of "pill box" system filled every 2 weeks.  Patientverbalized thathe was driving prior to admission butwife will bedriving andproviding transportationto hisdoctors'appointments after discharge.  Patientmentionedthatwife will be his primary caregiver at home when discharge.  [place] (CIR)per therapy recommendation.  Patientand daughter voiced understanding to call primary care provider's office whenhegetsback home, for a post discharge follow-up within1- 2weeksor sooner if needs arise. Patient letter (with PCP's contact number) was provided asareminder.   Discussed with patientand daughter regarding [facility] CM services available for health managementat home and patientvoiced interest about it. He had expressed understandingto seekreferral from primary care provider to [facility] care management ifdeemed necessary and appropriatefor services in the nearfuture- oncehe returns back home.  [facility] care management information was provided for future needs thathemay have.   For additional questions please contact:  Edwena Felty A. Donasia Wimes, BSN,  RN-BC [facility] PRIMARY CARE Navigator Cell: [phone]

## 2017-07-23 NOTE — Progress Notes (Signed)
Patient ID: [name], male   DOB: [birth year], 77 y.o.   MRN: [id number] Patient admitted to 4M01 via wheelchair, escorted by nursing staff and family.  Patient and family verbalized understanding of rehab process as presented by Isla Pence, RN.  Patient appears to be in no immediate distress at this time.  Brita Romp, RN

## 2017-07-23 NOTE — Progress Notes (Signed)
Retta Diones, RN  Rehab Admission Coordinator  Physical Medicine and Rehabilitation  PMR Pre-admission  Signed  Date of Service:  07/23/2017 10:39 AM       Related encounter: ED to Hosp-Admission (Current) from 07/19/2017 in [facility]      Signed            Show:Clear all [x] Manual[x] Template[x] Copied  Added by: [x] Karl Bales Evalee Mutton, RN   [] Hover for details   PMR Admission Coordinator Pre-Admission Assessment  Patient: [name] is an 77 y.o., male MRN: [id number] DOB: May 12, 1940 Height: 5\' 7"  (170.2 cm) Weight: 80.1 kg (176 lb 9.4 oz)                                                                                                                                              Insurance Information HMO:  No   PPO:       PCP:       IPA:       80/20:       OTHER:   PRIMARY:  Medicare A and B      Policy#: [id number]      Subscriber: patient CM Name:        Phone#:       Fax#:   Pre-Cert#:        Employer: Retired Benefits:  Phone #:       Name: Checked in [place]. Date: 04/10/03     Deduct: $1364      Out of Pocket Max: None      Life Max: N/A CIR: 100%      SNF: 100 days Outpatient: 80%     Co-Pay: 20% Home Health: 100%      Co-Pay: none DME: 80%     Co-Pay: 20% Providers: patient's choice  SECONDARY: [employer]      Policy#: [id number]      Subscriber:  patient CM Name:        Phone#:       Fax#:   Pre-Cert#:        Employer: Retired Benefits:  Phone #: [phone]     Name:   Eff. Date:       Deduct:        Out of Pocket Max:        Life Max:   CIR:        SNF:   Outpatient:       Co-Pay:   Home Health:        Co-Pay:   DME:       Co-Pay:    Emergency Contact Information         Contact Information    Name Relation Home Work Mobile   Kosar,[name] Spouse [phone]  [phone]     Current Medical History  Patient Admitting Diagnosis:  TIA with H/O  MG  History of Present Illness: A 77  y.o.malewith history of T2DM, MG, PE, PAD, CKD, CML, cerebellar stroke who was admitted on 07/19/17 with sudden onset of N/V and difficulty walking. CT head negative for bleed but tPA not given as patient had taken Eliquis that am . CTA head/neck was negative for emergent large vessel occlusion with chronic occluded L-VA and chronic dissected or ulcerated R-ICA. MRI brainreviewed, unremarkable for acute intracranial process. Per report,chronic moderate to advanced small vessel disease, remote left cerebellar infarct and was negative for acute changes. 2D echo showed EF 50-55 % with mild AVS. 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 To continue Eliquis, avoid dehydration and hypotension. Patient continues to be limited by BLE weakness with instability and has decline in functional status therefore CIR recommended for follow up therapy.  Total: 3=NIH  Past Medical History      Past Medical History:  Diagnosis Date  . Bruises easily    d/t being on Eliquis  . Carotid artery disease ([place])    a. Duplex 05/2014: 43-32% RICA, 9-51% LICA, elevated velocities in right subclavian artery, normal left subclavian artery..  . Chronic back pain    HNP  . Chronic kidney disease (CKD)   . Claustrophobia    takes Ativan as needed  . 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 Coronary atherosclerosis of unspecified type of vessel, native or graft    a. cath in 2003 showed 10-20% LM, scattered 20% prox-mid LAD, 30% more distal LAD, 50-60% stenosis of LAD towards apex, 20% Cx, 30% dRCA, focal 95% stenosis in smaller of 2 branches of PDA, EF 60-65%.  . Diabetes mellitus ([place])   . Diarrhea    takes Imodium daily as needed  . Diverticulosis   . Diverticulosis of colon (without mention of hemorrhage)   . DJD (degenerative joint disease)   . DVT  (deep venous thrombosis) ([place]) 07/2011  . Esophageal stricture   . Essential hypertension    takes Imdur and Lisinopril daily  . Gastric polyps    benign  . GERD (gastroesophageal reflux disease)    takes Nexium daily  . Glaucoma    uses eye drops  . History of bronchitis    not sure when the last time  . History of colon polyps    benign  . History of kidney stones   . History of kidney stones   . Insomnia    takes Melatonin nightly as needed  . Mixed hyperlipidemia    takes Crestor daily  . Myasthenia gravis ([place])    uses prednisone  . Osteoporosis 2016  . Peripheral edema    takes Lasix daily  . Pituitary tumor    checks Dr.Walden at [facility] checks it every Jan   . Pneumonia 01/2016  . PONV (postoperative nausea and vomiting)   . Ptosis   . Pulmonary embolus ([place]) 2012  . Stroke [facility]) 06/2015    Family History  family history includes Breast cancer in his sister; Clotting disorder in his sister; Dementia in his sister; Heart attack in his father, mother, sister, sister, and sister; Heart disease in his father, mother, and sister; Stroke in his father and mother.  Prior Rehab/Hospitalizations: Had rehab 2 years ago after CVA and then had [facility] for follow up after CVA  Has the patient had major surgery during 100 days prior  to admission? No  Current Medications   Current Facility-Administered Medications:  .   stroke: mapping our early stages of recovery book, , Does not apply, Once, Rai, Ripudeep K, MD .  acetaminophen (TYLENOL) tablet 650 mg, 650 mg, Oral, Q6H PRN **OR** acetaminophen (TYLENOL) suppository 650 mg, 650 mg, Rectal, Q6H PRN, Rai, Ripudeep K, MD .  apixaban (ELIQUIS) tablet 5 mg, 5 mg, Oral, BID, Wertman, Sara E, PA-C, 5 mg at 07/23/17 0851 .  bisacodyl (DULCOLAX) suppository 10 mg, 10 mg, Rectal, Daily PRN, Rai, Ripudeep K, MD .  brimonidine (ALPHAGAN) 0.2 % ophthalmic solution 1 drop, 1 drop, Both Eyes, BID, 1  drop at 07/23/17 0853 **AND** timolol (TIMOPTIC) 0.5 % ophthalmic solution 1 drop, 1 drop, Both Eyes, BID, Wertman, Coralee Pesa, PA-C, 1 drop at 07/23/17 0853 .  famotidine (PEPCID) tablet 20 mg, 20 mg, Oral, BID, Rondel Jumbo, PA-C, 20 mg at 07/23/17 0852 .  hydrALAZINE (APRESOLINE) injection 5 mg, 5 mg, Intravenous, Q8H PRN, Rai, Ripudeep K, MD .  imatinib (GLEEVEC) tablet 300 mg, 300 mg, Oral, Q1200, Samuella Cota, MD, 300 mg at 07/22/17 1406 .  insulin aspart (novoLOG) injection 0-9 Units, 0-9 Units, Subcutaneous, TID WC, Rai, Ripudeep K, MD, 1 Units at 07/22/17 1711 .  isosorbide mononitrate (IMDUR) 24 hr tablet 30 mg, 30 mg, Oral, Daily, Rondel Jumbo, PA-C, 30 mg at 07/23/17 0851 .  lidocaine (LIDODERM) 5 % 1 patch, 1 patch, Transdermal, Daily PRN, Rondel Jumbo, PA-C, 1 patch at 07/22/17 1014 .  LORazepam (ATIVAN) tablet 0.5 mg, 0.5 mg, Oral, QHS PRN, Rondel Jumbo, PA-C .  meclizine (ANTIVERT) tablet 25 mg, 25 mg, Oral, TID PRN, Rai, Ripudeep K, MD, 25 mg at 07/21/17 1222 .  [DISCONTINUED] ondansetron (ZOFRAN) tablet 4 mg, 4 mg, Oral, Q6H PRN **OR** ondansetron (ZOFRAN) injection 4 mg, 4 mg, Intravenous, Q6H PRN, Rai, Ripudeep K, MD .  predniSONE (DELTASONE) tablet 3 mg, 3 mg, Oral, Q breakfast, Garvin Fila, MD, 3 mg at 07/23/17 0851 .  pyridostigmine (MESTINON) tablet 60 mg, 60 mg, Oral, Daily, Rondel Jumbo, PA-C, 60 mg at 07/23/17 5643 .  rosuvastatin (CRESTOR) tablet 5 mg, 5 mg, Oral, q1800, Biby, Sharon L, NP, 5 mg at 07/22/17 1711 .  senna-docusate (Senokot-S) tablet 1 tablet, 1 tablet, Oral, QHS PRN, Rai, Ripudeep K, MD .  tobramycin (TOBREX) 0.3 % ophthalmic solution 1 drop, 1 drop, Both Eyes, QID, Wertman, Coralee Pesa, PA-C, 1 drop at 07/23/17 0852 .  traMADol (ULTRAM) tablet 50 mg, 50 mg, Oral, Q8H PRN, Shawn Route, Sara E, PA-C  Patients Current Diet:       Diet Order           Diet Carb Modified Fluid consistency: Thin; Room service appropriate? Yes  Diet effective  now          Precautions / Restrictions Precautions Precautions: Fall Precaution Comments: L LE buckling   Has the patient had 2 or more falls or a fall with injury in the past year?No  Prior Activity Level Community (5-7x/wk): Went out daily to eat, was driving.    Home Assistive Devices / Equipment Home Assistive Devices/Equipment: Cane (specify quad or straight) Home Equipment: Shower seat - built in  Prior Device Use: Indicate devices/aids used by the patient prior to current illness, exacerbation or injury? Walking stick for longer distances  Prior Functional Level Prior Function Level of Independence: Independent  Self Care: Did the patient need help bathing, dressing, using the  toilet or eating?  Independent  Indoor Mobility: Did the patient need assistance with walking from room to room (with or without device)? Independent  Stairs: Did the patient need assistance with internal or external stairs (with or without device)? Independent  Functional Cognition: Did the patient need help planning regular tasks such as shopping or remembering to take medications? Independent  Current Functional Level Cognition  Arousal/Alertness: Awake/alert Overall Cognitive Status: Impaired/Different from baseline Orientation Level: Oriented X4 Safety/Judgement: Decreased awareness of deficits, Decreased awareness of safety General Comments: decreased awareness of deficits thorughout session Attention: Alternating Alternating Attention: Appears intact Memory: Impaired Memory Impairment: Storage deficit, Retrieval deficit Awareness: Impaired Awareness Impairment: Emergent impairment Problem Solving: (tba) Safety/Judgment: Other (comment) Comments: answered questions appropriately per testing; functional deficits noted per OT/PT    Extremity Assessment (includes Sensation/Coordination)  Upper Extremity Assessment: RUE deficits/detail RUE Deficits / Details: MMT 3  out 5 / tremor/ pt with over and undershooting with finger to nose (ataxic movement) pt needs incr time and decr speed to really attempt to place finger on his nose and then switch to therapists finger tip.  RUE Coordination: decreased fine motor, decreased gross motor(pt dropping top off the tooth paste) LUE Deficits / Details: MMT 3+ out 5/ noted to have a full body tremor that is not limited to the left but is present . pt able to complete finger to nose smoothly  Lower Extremity Assessment: LLE deficits/detail RLE Coordination: decreased fine motor, decreased gross motor LLE Deficits / Details: pt buckling with transfer x2 this session with mod ()A for lob LLE Coordination: decreased fine motor, decreased gross motor    ADLs  Overall ADL's : Needs assistance/impaired Grooming: Moderate assistance, Standing, Oral care, Wash/dry face, Wash/dry hands Grooming Details (indicate cue type and reason): pt with posterior lean. pt noted to have toes off the ground and posterior lean without awareness. pt does not attempt to self correct until therapist is already aiding with phsycial assistance. pt bracing on all surfaces with sit<>Stand transfer. pt does not recognize this and actually states "i think i am stronger and better today"  Lower Body Bathing: Moderate assistance, Sit to/from stand Toilet Transfer: Minimal assistance, RW, BSC Toilet Transfer Details (indicate cue type and reason): pt with less (A) requires with bathroom transfer with use of RW and but patient demonstrates safety errors with RW . pt walking outside of the frame of the RW. pt pushing RW to the side and attempting to reach to the lateral side of RW. pt needs mod cues for RW safety. pt again demonstrates posterior LOB with correction by therapist. pt attempting to exit the bathroom without (A).  Functional mobility during ADLs: Moderate assistance General ADL Comments: pt eager to get OOB and reports great night sleep. pt  complete adl task at the sink demonstrating posterior LOB and decr fine/ gross motor . pt with tremor incr in intensity with movement for adl task. pt with lack of awareness. OT transfering patient without RW to assess balance further this session and bil LE buckle once and x2 LLE buckle. pt seems not alarmed by the event and reports feeling that he is better at this time.     Mobility  Overal bed mobility: Needs Assistance Bed Mobility: Rolling, Supine to Sit, Sit to Supine Rolling: Supervision Supine to sit: Min assist Sit to supine: Min assist General bed mobility comments: pt sitting EOB upon arrival with family present    Transfers  Overall transfer level: Needs assistance Equipment used:  1 person hand held assist Transfers: Sit to/from Stand Sit to Stand: Min assist General transfer comment: several attempts to stand without assistance however pt unable to come into standing; assistance to rise and to gain balance upon standing;pt bracing himself against EOB    Ambulation / Gait / Stairs / Wheelchair Mobility  Ambulation/Gait Ambulation/Gait assistance: Min assist, Max assist(min-max A required) Gait Distance (Feet): 130 Feet Assistive device: None(assist with gait belt at trunk ) Gait Pattern/deviations: Ataxic, Narrow base of support, Staggering right, Staggering left General Gait Details: pt presents with ataxia and mulitple LOB requiring assistance to recover; pt pauses frequently during gait to regain balance; pt cannot tolerate challenges to gait including horizontal (especially to L) and vertical head turns; pt with scissoring when turning to L side but smoother turn going toward R side Gait velocity: decreased    Posture / Balance Dynamic Sitting Balance Sitting balance - Comments: when tremor starts pt requires bil UE to keep him in static standing. pt with posterior lob with tremor Balance Overall balance assessment: Needs assistance Sitting-balance support:  Bilateral upper extremity supported, Feet supported Sitting balance-Leahy Scale: Fair Sitting balance - Comments: when tremor starts pt requires bil UE to keep him in static standing. pt with posterior lob with tremor Standing balance support: Bilateral upper extremity supported, During functional activity Standing balance-Leahy Scale: Poor Standing balance comment: pt continues to realy on alternative assist for all aspects of standing.    Special needs/care consideration BiPAP/CPAP No CPM No Continuous Drip IV No Dialysis No      Life Vest No Oxygen No Special Bed No Trach Size No Wound Vac (area) No    Skin Bruises easily                            Bowel mgmt: Last BM 07/23/17 per patient Bladder mgmt: Voiding in bathroom with assist Diabetic mgmt Prediabetic per patient    Previous Home Environment Living Arrangements: Spouse/significant other  Lives With: Spouse Available Help at Discharge: Family Type of Home: House Home Layout: Two level, Bed/bath upstairs Home Access: Stairs to enter Entrance Stairs-Rails: Right, Left Entrance Stairs-Number of Steps: 3 Bathroom Shower/Tub: Multimedia programmer: Programmer, systems: Yes [place]: Other (Comment)  Discharge Living Setting Plans for Discharge Living Setting: Patient's home, House, Lives with (comment)(Lives with wife.) Type of Home at Discharge: House Discharge Home Layout: Two level, Laundry or work area in basement, Able to live on main level with bedroom/bathroom(Split level home) Alternate Level Stairs-Number of Steps: 9 steps up to main level and 9 steps down to basement level Discharge Home Access: Stairs to enter Entrance Stairs-Number of Steps: 4 steps Does the patient have any problems obtaining your medications?: No  Social/Family/Support Systems Patient Roles: Spouse(Has a wife.) Contact Information: Presten Joost - wife Anticipated Caregiver: wife Anticipated  Caregiver's Contact Information: Hassan Rowan - wife - [phone] Ability/Limitations of Caregiver: wife is retired and can provide supervision. Caregiver Availability: 24/7 Discharge Plan Discussed with Primary Caregiver: Yes Is Caregiver In Agreement with Plan?: Yes Does Caregiver/Family have Issues with Lodging/Transportation while Pt is in Rehab?: No  Goals/Additional Needs Patient/Family Goal for Rehab: PT/OT supervision goals Expected length of stay: 7-10 days Cultural Considerations: [employer], attends church weekly Dietary Needs: Carb mod, med cal, thin liquids Equipment Needs: TBD Pt/Family Agrees to Admission and willing to participate: Yes Program Orientation Provided & Reviewed with Pt/Caregiver Including Roles  & Responsibilities: Yes  Decrease  burden of Care through IP rehab admission: N/A  Possible need for SNF placement upon discharge: Not anticipated  Patient Condition: This patient's medical and functional status has changed since the consult dated: 07/21/17 in which the Rehabilitation Physician determined and documented that the patient's condition is appropriate for intensive rehabilitative care in an inpatient rehabilitation facility. See "History of Present Illness" (above) for medical update. Functional changes are:  Currently requiring min to max assist with ambulation with balance issues, did ambulate 130 feet.  Patient's medical and functional status update has been discussed with the Rehabilitation physician and patient remains appropriate for inpatient rehabilitation. Will admit to inpatient rehab today.  Preadmission Screen Completed By:  Retta Diones, 07/23/2017 10:51 AM ______________________________________________________________________   Discussed status with Dr. Naaman Plummer on 07/23/17 at 1051 and received telephone approval for admission today.  Admission Coordinator:  Retta Diones, time 1051/Date 07/23/17             Cosigned by: Meredith Staggers, MD at 07/23/2017 11:27 AM  Revision History

## 2017-07-23 NOTE — H&P (Signed)
Physical Medicine and Rehabilitation Admission H&P     Chief Complaint  Patient presents with  .   HPI: [name] is a 77 year old male with history of T2DM, MG, PE, PAD, CKD, CML, cerebellar stroke who was admitted on 07/19/17 with sudden onset of N/V and difficulty walking. CT head was negative for bleed but TPA not given as patient had taken Eliquis that a.m. 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 MRI brain was negative for acute intracranial process. And showed chronic moderate to advanced small vessel disease. 2D echo done showing EF of 50 to 55% with mild ABS. Dr. 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 He recommended continuing Eliquis, avoiding dehydration and hypotension. Patient continues to be limited by bilateral lower extremity weakness with ataxia and instability he has had decline in functional status that was CIR was recommended for follow-up therapy    Review of Systems  Constitutional: Negative for chills and fever.  HENT: Positive for hearing loss and tinnitus (chronic).  Eyes: Negative for blurred vision and double vision.  Left eye with itching/burning for the past 3-4 weeks.  Respiratory: Negative for cough and shortness of breath.  Cardiovascular: Negative for chest pain and palpitations.  Gastrointestinal: Positive for heartburn. Negative for nausea.  Genitourinary: Positive for frequency. Negative for dysuria.  Musculoskeletal: Negative for joint pain and myalgias.  Neurological: Positive for dizziness. Negative for sensory change and speech change.       Past Medical History:  Diagnosis Date  . Bruises easily    d/t being on Eliquis  . Carotid artery disease ([place])    a. Duplex 05/2014: 35-57% RICA, 3-22%  LICA, elevated velocities in right subclavian artery, normal left subclavian artery..  . Chronic back pain    HNP  . Chronic kidney disease (CKD)   . Claustrophobia    takes Ativan as needed  . 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 Coronary atherosclerosis of unspecified type of vessel, native or graft    a. cath in 2003 showed 10-20% LM, scattered 20% prox-mid LAD, 30% more distal LAD, 50-60% stenosis of LAD towards apex, 20% Cx, 30% dRCA, focal 95% stenosis in smaller of 2 branches of PDA, EF 60-65%.  . Diabetes mellitus ([place])   . Diarrhea    takes Imodium daily as needed  . Diverticulosis   . Diverticulosis of colon (without mention of hemorrhage)   . DJD (degenerative joint disease)   . DVT (deep venous thrombosis) ([place]) 07/2011  . Esophageal stricture   . Essential hypertension    takes Imdur and Lisinopril daily  . Gastric polyps    benign  . GERD (gastroesophageal reflux disease)    takes Nexium daily  . Glaucoma    uses eye drops  . History of bronchitis    not sure when the last time  . History of colon polyps    benign  . History of kidney stones   . History of kidney stones   . Insomnia    takes Melatonin nightly as needed  . Mixed hyperlipidemia    takes Crestor daily  . Myasthenia gravis ([place])    uses prednisone  . Osteoporosis 2016  . Peripheral edema    takes Lasix daily  . Pituitary  tumor    checks [facility]Walden at [facility] checks it every Jan   . Pneumonia 01/2016  . PONV (postoperative nausea and vomiting)   . Ptosis   . Pulmonary embolus ([place]) 2012  . Stroke [facility]) 06/2015        Past Surgical History:  Procedure Laterality Date  . ABDOMINAL AORTOGRAM W/LOWER EXTREMITY N/A 11/18/2016   Procedure: ABDOMINAL AORTOGRAM W/LOWER EXTREMITY; Surgeon: [name], MD; Location: [facility]; Service: Cardiovascular; Laterality: N/A;  . BACK SURGERY     x2, lumbar and cervical  . CARDIAC CATHETERIZATION    . CATARACT  EXTRACTION Bilateral 12/12  . COLONOSCOPY    . ESOPHAGOGASTRODUODENOSCOPY ENDOSCOPY  04/2015  . IR GENERIC HISTORICAL  03/03/2016   IR IVC FILTER PLMT / S&I [name] GUID/MOD SED 03/03/2016 [name], MD [facility]  . IR GENERIC HISTORICAL  04/09/2016   IR RADIOLOGIST EVAL & MGMT 04/09/2016 [name], MD [facility]  . IR GENERIC HISTORICAL  04/30/2016   IR IVC FILTER RETRIEVAL / S&I [name] GUID/MOD SED 04/30/2016 [name], MD [facility]INTER[facility]  . LITHOTRIPSY    . LUMBAR LAMINECTOMY/DECOMPRESSION MICRODISCECTOMY Right 09/14/2012   Procedure: Right Lumbar three-four, four-five, Lumbar five-Sacral one decompressive laminectomy; Surgeon: [name], MD; Location: [facility]; Service: Neurosurgery; Laterality: Right;  . LUMBAR LAMINECTOMY/DECOMPRESSION MICRODISCECTOMY Left 03/11/2016   Procedure: Left Lumbar Two-ThreeMicrodiscectomy; Surgeon: [name], MD; Location: [facility]; Service: Neurosurgery; Laterality: Left;  . PERIPHERAL VASCULAR INTERVENTION  11/18/2016   Procedure: PERIPHERAL VASCULAR INTERVENTION; Surgeon: [name], MD; Location: [facility]; Service: Cardiovascular;; Left popliteal  . SHOULDER SURGERY Left 05/07/2009  . TONSILLECTOMY          Family History  Problem Relation Age of Onset  . Heart disease Mother   . Heart attack Mother   . Stroke Mother   . Heart disease Father   . Heart attack Father   . Stroke Father   . Breast cancer Sister    Twin   . Heart attack Sister   . Dementia Sister   . Heart disease Sister   . Heart attack Sister   . Clotting disorder Sister   . Heart attack Sister   . Colon cancer Neg Hx    Social History: Married. Retired Freight forwarder. Independent PTA without AD. He reports that he has never smoked. He has never used smokeless tobacco. He reports that he does not drink alcohol or use drugs.       Allergies  Allergen Reactions  . Phenergan [Promethazine Hcl] Swelling and Other (See Comments)    Cannot be combined with  Zofran because swelling of the eyes resulted and patient became very restless-  . Zofran [Ondansetron Hcl] Swelling and Other (See Comments)    Cannot be combined with Phenergan because swelling of the eyes resulted and patient became very restless-  . Sulfamethoxazole Rash and Itching         Medications Prior to Admission  Medication Sig Dispense Refill  . brimonidine-timolol (COMBIGAN) 0.2-0.5 % ophthalmic solution Place 1 drop into both eyes every 12 (twelve) hours.    . cholecalciferol (VITAMIN D) 1000 units tablet Take 1,000 Units by mouth daily.     Marland Kitchen ELIQUIS 5 MG TABS tablet TAKE 1 TABLET (5 MG TOTAL) BY MOUTH 2 (TWO) TIMES DAILY. 60 tablet 3  . furosemide (LASIX) 20 MG tablet TAKE 1 TABLET BY MOUTH DAILY. 90 tablet 2  . imatinib (GLEEVEC) 100 MG tablet Take 300  mg by mouth daily at 12 noon. Take with meals and large glass of water.Caution:Chemotherapy     . isosorbide mononitrate (IMDUR) 30 MG 24 hr tablet Take 1 tablet (30 mg total) by mouth daily. 90 tablet 3  . lidocaine (LIDODERM) 5 % Place 1 patch onto the skin daily as needed (for back pain). Remove & Discard patch within 12 hours or as directed by MD     . lisinopril (PRINIVIL,ZESTRIL) 5 MG tablet Take 1 tablet (5 mg total) by mouth daily. 30 tablet 11  . loperamide (IMODIUM) 2 MG capsule Take 2-4 mg by mouth 3 (three) times daily as needed for diarrhea or loose stools.     Marland Kitchen LORazepam (ATIVAN) 0.5 MG tablet TAKE 1 TABLET BY MOUTH AT BEDTIME AS NEEDED FOR SLEEP 30 tablet 3  . Melatonin 3 MG TABS Take 3 mg by mouth at bedtime as needed (for sleep).     . Multiple Vitamin (MULTIVITAMIN) tablet Take 1 tablet by mouth daily.    . ondansetron (ZOFRAN ODT) 8 MG disintegrating tablet Take 1 tablet (8 mg total) by mouth every 8 (eight) hours as needed for nausea or vomiting. 90 tablet 1  . predniSONE (DELTASONE) 1 MG tablet Take 3 tablets (3 mg total) by mouth daily with breakfast. 270 tablet 1  . pyridostigmine (MESTINON) 60 MG tablet  Take 60 mg by mouth daily.    . ranitidine (ZANTAC) 150 MG tablet Take 150 mg by mouth 2 (two) times daily as needed for heartburn.    . rosuvastatin (CRESTOR) 10 MG tablet Take 5 mg by mouth daily.    Marland Kitchen tobramycin (TOBREX) 0.3 % ophthalmic solution Place 1 drop into both eyes 4 (four) times daily. FOR 3 WEEKS  3  . traMADol (ULTRAM) 50 MG tablet Take 2 tablets (100 mg total) by mouth every 8 (eight) hours. (Patient taking differently: Take 50 mg by mouth every 8 (eight) hours as needed (for pain). ) 15 tablet 0  . amoxicillin-clavulanate (AUGMENTIN) 875-125 MG tablet Take 1 tablet by mouth 2 (two) times daily. One po bid x 7 days (Patient not taking: Reported on 07/19/2017) 14 tablet 0  . Fluticasone-Salmeterol (ADVAIR DISKUS) 100-50 MCG/DOSE AEPB Inhale 1 puff into the lungs 2 (two) times daily. (Patient not taking: Reported on 07/19/2017) 1 each 1  . NITROSTAT 0.4 MG SL tablet PLACE 1 TABLET UNDER THE TONGUE EVERY 5 MINUTES X3 DOSES AS NEEDED FOR CHEST PAIN (Patient not taking: Reported on 07/19/2017) 100 tablet 1  . predniSONE (DELTASONE) 20 MG tablet Take 1 tablet (20 mg total) by mouth 2 (two) times daily. (Patient not taking: Reported on 07/19/2017) 10 tablet 0  . promethazine-codeine (PHENERGAN WITH CODEINE) 6.25-10 MG/5ML syrup Take 2.5 mLs by mouth every 4 (four) hours as needed for cough. (Patient not taking: Reported on 07/19/2017) 120 mL 0   Drug Regimen Review  Drug regimen was reviewed and remains appropriate with no significant issues identified  Home:  Home Living  Family/patient expects to be discharged to:: Private residence  Living Arrangements: Spouse/significant other  Available Help at Discharge: Family  Type of Home: House  Home Access: Stairs to enter  Technical brewer of Steps: 3  Entrance Stairs-Rails: Right, Left  Home Layout: Two level, Bed/bath upstairs  Bathroom Shower/Tub: Tourist information centre manager: Standard  Bathroom Accessibility: Yes  Home  Equipment: Civil engineer, contracting - built in  Lives With: Spouse  Functional History:  Prior Function  Level of Independence: Independent  Functional Status:  Mobility:  Bed Mobility  Overal bed mobility: Needs Assistance  Bed Mobility: Rolling, Supine to Sit, Sit to Supine  Rolling: Supervision  Supine to sit: Min assist  Sit to supine: Min assist  General bed mobility comments: pt sitting EOB upon arrival with family present  Transfers  Overall transfer level: Needs assistance  Equipment used: 1 person hand held assist  Transfers: Sit to/from Stand  Sit to Stand: Min assist  General transfer comment: several attempts to stand without assistance however pt unable to come into standing; assistance to rise and to gain balance upon standing;pt bracing himself against EOB  Ambulation/Gait  Ambulation/Gait assistance: Min assist, Max assist(min-max A required)  Gait Distance (Feet): 130 Feet  Assistive device: None(assist with gait belt at trunk )  Gait Pattern/deviations: Ataxic, Narrow base of support, Staggering right, Staggering left  General Gait Details: pt presents with ataxia and mulitple LOB requiring assistance to recover; pt pauses frequently during gait to regain balance; pt cannot tolerate challenges to gait including horizontal (especially to L) and vertical head turns; pt with scissoring when turning to L side but smoother turn going toward R side  Gait velocity: decreased   ADL:  ADL  Overall ADL's : Needs assistance/impaired  Grooming: Moderate assistance, Standing, Oral care, Wash/dry face, Wash/dry hands  Grooming Details (indicate cue type and reason): pt with posterior lean. pt noted to have toes off the ground and posterior lean without awareness. pt does not attempt to self correct until therapist is already aiding with phsycial assistance. pt bracing on all surfaces with sit<>Stand transfer. pt does not recognize this and actually states "i think i am stronger and better  today"  Lower Body Bathing: Moderate assistance, Sit to/from stand  Toilet Transfer: Minimal assistance, RW, BSC  Toilet Transfer Details (indicate cue type and reason): pt with less (A) requires with bathroom transfer with use of RW and but patient demonstrates safety errors with RW . pt walking outside of the frame of the RW. pt pushing RW to the side and attempting to reach to the lateral side of RW. pt needs mod cues for RW safety. pt again demonstrates posterior LOB with correction by therapist. pt attempting to exit the bathroom without (A).  Functional mobility during ADLs: Moderate assistance  General ADL Comments: pt eager to get OOB and reports great night sleep. pt complete adl task at the sink demonstrating posterior LOB and decr fine/ gross motor . pt with tremor incr in intensity with movement for adl task. pt with lack of awareness. OT transfering patient without RW to assess balance further this session and bil LE buckle once and x2 LLE buckle. pt seems not alarmed by the event and reports feeling that he is better at this time.  Cognition:  Cognition  Overall Cognitive Status: Impaired/Different from baseline  Arousal/Alertness: Awake/alert  Orientation Level: Oriented X4  Attention: Alternating  Alternating Attention: Appears intact  Memory: Impaired  Memory Impairment: Storage deficit, Retrieval deficit  Awareness: Impaired  Awareness Impairment: Emergent impairment  Problem Solving: (tba)  Safety/Judgment: Other (comment)  Comments: answered questions appropriately per testing; functional deficits noted per OT/PT  Cognition  Arousal/Alertness: Awake/alert  Behavior During Therapy: WFL for tasks assessed/performed  Overall Cognitive Status: Impaired/Different from baseline  Area of Impairment: Safety/judgement, Awareness  Safety/Judgement: Decreased awareness of deficits, Decreased awareness of safety  Awareness: Emergent  General Comments: decreased awareness of  deficits thorughout session    Blood pressure (!) 142/59, pulse 74, temperature 97.8 F (36.6  C), temperature source Oral, resp. rate 17, height 5\' 7"  (1.702 m), weight 80.1 kg (176 lb 9.4 oz), SpO2 99 %.  Physical Exam   Nursing note and vitals reviewed.  Constitutional: He appears well-developed and well-nourished. No distress.  HENT:  Head: Normocephalic.  Eyes: Pupils are equal, round, and reactive to light. Left conjunctiva is injected.  Bilateral scleral irritation  Neck: Normal range of motion. No tracheal deviation present. No thyromegaly present.  Cardiovascular: Normal rate and regular rhythm. Exam reveals no friction rub.  No murmur heard.  Respiratory: Effort normal and breath sounds normal. No respiratory distress. He has no wheezes. He has no rales.  GI: Soft. Bowel sounds are normal. He exhibits no distension. There is no tenderness. There is no rebound.  Musculoskeletal: Normal range of motion. He exhibits no edema or deformity.  Neurological:  3-4 beats of nystagmus left eye with left lateral gaze. Not symptomatic. Cognitively appropriate. No resting tremors, speech generally clear. RUE 4/5 prox to distal. RLE 4- to 4/5 prox to distal. LUE and LLE 5/5. Normal sensation bilateral upper and lower extremities.  Skin: Skin is warm.  Psychiatric: He has a normal mood and affect. His behavior is normal. Judgment and thought content normal.     Lab Results Last 48 Hours                                                                                                                                                                                                                                                                                                 Imaging Results (Last 48 hours)     Medical Problem List and Plan:  1. Right hemiparesis and functional deficits secondary to ?left subcortical infarct, hx of myasthenia  gravis.  -admit to inpatient rehab  2. DVT Prophylaxis/Anticoagulation: Pharmaceutical: Other (comment)--Eliquis  3. Pain Management: tylenol prn.  4. Mood: LCSW to follow for evaluation and support.  5. Neuropsych: This patient is capable of making decisions on his own behalf.  6. Skin/Wound Care: Routine pressure relief measures.  7. Fluids/Electrolytes/Nutrition: Monitor I/O. Check lytes in am.  8. MG: On Mestinon and prednisone.  9. T2DM: Monitor BS ac/hs  10. CML: In remission on Gleevec  11. GERD: Resume Zantac bid.  12. HTN: Monitor BP bid--continue Imdur.  13. CAD : Monitor for symptoms with activity. On Imdur and Crestor.  14. Glaucoma: Managed with Timolol and Alphagan bid.  -on tobrex eye gtts recently for recent infection  -?steroid gtts for inflammation (already receiving prednisone PO)    Post Admission Physician Evaluation:  1. Functional deficits secondary to left subcortical infarct, hx of MG. 2. Patient is admitted to receive collaborative, interdisciplinary care between the physiatrist, rehab nursing staff, and therapy team. 3. Patient's level of medical complexity and substantial therapy needs in context of that medical necessity cannot be provided at a lesser intensity of care such as a SNF. 4. Patient has experienced substantial functional loss from his/her baseline which was documented above under the "Functional History" and "Functional Status" headings. Judging by the patient's diagnosis, physical exam, and functional history, the patient has potential for functional progress which will result in measurable gains while on inpatient rehab. These gains will be of substantial and practical use upon discharge in facilitating mobility and self-care at the household level. 5. Physiatrist will provide 24 hour management of medical needs as well as oversight of the therapy plan/treatment and provide guidance as appropriate regarding the interaction of the two. 6. The  Preadmission Screening has been reviewed and patient status is unchanged unless otherwise stated above. 7. 24 hour rehab nursing will assist with bladder management, bowel management, safety, skin/wound care, disease management, medication administration, pain management and patient education and help integrate therapy concepts, techniques,education, etc. 8. PT will assess and treat for/with: Lower extremity strength, range of motion, stamina, balance, functional mobility, safety, adaptive techniques and equipment, NMR, family education. Goals are: supervision. 9. OT will assess and treat for/with: ADL's, functional mobility, safety, upper extremity strength, adaptive techniques and equipment, NMR, family ed. Goals are: supervision. Therapy may proceed with showering this patient. 10. SLP will assess and treat for/with: n/a. Goals are: n/a. 11. Case Management and Social Worker will assess and treat for psychological issues and discharge planning. 12. Team conference will be held weekly to assess progress toward goals and to determine barriers to discharge. 13. Patient will receive at least 3 hours of therapy per day at least 5 days per week. 14. ELOS: 7-10 days  15. Prognosis: excellent    I have personally performed a face to face diagnostic evaluation of this patient and formulated the key components of the plan. Additionally, I have personally reviewed laboratory data, imaging studies, as well as relevant notes and concur with the physician assistant's documentation above.  Meredith Staggers, MD, Mellody Drown  Bary Leriche, PA-C  07/23/2017

## 2017-07-23 NOTE — Progress Notes (Signed)
Jamse Arn, MD  Physician  Physical Medicine and Rehabilitation  Consult Note  Signed  Date of Service:  07/21/2017 2:59 PM       Related encounter: ED to Hosp-Admission (Current) from 07/19/2017 in [place] [facility]      Signed      Expand All Collapse All       Show:Clear all [x] Manual[x] Template[] Copied  Added by: [x] Love, Ivan Anchors, PA-C[x] Jamse Arn, MD   [] Hover for details        Physical Medicine and Rehabilitation Consult   Reason for Consult: TIA with balance deficits.  Referring Physician: Dr. Leonie Man.    HPI: [name] is a 77 y.o. male with history of T2DM, MG, PE, PAD, CKD, CML, cerebellar stroke who was admitted on 07/19/17 with sudden onset of N/V and difficulty walking. CT head negative for bleed but tPA not given as patient had taken Eliquis that am . CTA head/neck was negative for emergent large vessel occlusion with chronic occluded L-VA and chronic dissected or ulcerated R-ICA. MRI brain reviewed, unremarkable for acute intracranial process. Per report, chronic moderate to advanced small vessel disease, remote left cerebellar infarct and was negative for acute changes. 2D echo showed EF 50-55 % with mild AVS. 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 To continue Eliquis, avoid dehydration and hypotension. Patient continues to be limited by BLE weakness with instability and has decline in functional status therefore CIR recommended for follow up therapy.    Review of Systems  Constitutional: Negative for chills and fever.  HENT: Positive for hearing loss. Negative for tinnitus.   Eyes: Negative for blurred vision and double vision.  Respiratory: Negative for cough and shortness of breath.   Cardiovascular: Negative for chest pain and palpitations.  Gastrointestinal: Positive for diarrhea  (occasionally due to [place]). Negative for constipation, heartburn and nausea.  Genitourinary: Negative for dysuria and urgency.  Musculoskeletal: Positive for back pain and myalgias. Negative for joint pain.       "Legs ache all the time--able to walk a few feet to let legs catch up".  Rib pain  Neurological: Positive for dizziness. Negative for speech change, focal weakness and headaches.  Psychiatric/Behavioral: Positive for suicidal ideas. Negative for depression.  All other systems reviewed and are negative.        Past Medical History:  Diagnosis Date  . Bruises easily    d/t being on Eliquis  . Carotid artery disease ([place])    a. Duplex 05/2014: 79-03% RICA, 8-33% LICA, elevated velocities in right subclavian artery, normal left subclavian artery..  . Chronic back pain    HNP  . Chronic kidney disease (CKD)   . Claustrophobia    takes Ativan as needed  . 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 Coronary atherosclerosis of unspecified type of vessel, native or graft    a. cath in 2003 showed 10-20% LM, scattered 20% prox-mid LAD, 30% more distal LAD, 50-60% stenosis of LAD towards apex, 20% Cx, 30% dRCA, focal 95% stenosis in smaller of 2 branches of PDA, EF 60-65%.  . Diabetes mellitus ([place])   . Diarrhea    takes Imodium daily as needed  . Diverticulosis   . Diverticulosis of colon (without mention of hemorrhage)   . DJD (degenerative joint disease)   . DVT (deep venous thrombosis) ([place]) 07/2011  . Esophageal  stricture   . Essential hypertension    takes Imdur and Lisinopril daily  . Gastric polyps    benign  . GERD (gastroesophageal reflux disease)    takes Nexium daily  . Glaucoma    uses eye drops  . History of bronchitis    not sure when the last time  . History of colon polyps    benign  . History of kidney stones   . History of kidney stones   . Insomnia    takes Melatonin nightly as needed  .  Mixed hyperlipidemia    takes Crestor daily  . Myasthenia gravis ([place])    uses prednisone  . Osteoporosis 2016  . Peripheral edema    takes Lasix daily  . Pituitary tumor    checks Dr.Walden at [facility] checks it every Jan   . Pneumonia 01/2016  . PONV (postoperative nausea and vomiting)   . Ptosis   . Pulmonary embolus ([place]) 2012  . Stroke [facility]) 06/2015         Past Surgical History:  Procedure Laterality Date  . ABDOMINAL AORTOGRAM W/LOWER EXTREMITY N/A 11/18/2016   Procedure: ABDOMINAL AORTOGRAM W/LOWER EXTREMITY;  Surgeon: [name], MD;  Location: [facility];  Service: Cardiovascular;  Laterality: N/A;  . BACK SURGERY     x2, lumbar and cervical  . CARDIAC CATHETERIZATION    . CATARACT EXTRACTION Bilateral 12/12  . COLONOSCOPY    . ESOPHAGOGASTRODUODENOSCOPY ENDOSCOPY  04/2015  . IR GENERIC HISTORICAL  03/03/2016   IR IVC FILTER PLMT / S&I [name] GUID/MOD SED 03/03/2016 [name], MD [facility]  . IR GENERIC HISTORICAL  04/09/2016   IR RADIOLOGIST EVAL & MGMT 04/09/2016 [name], MD [facility]  . IR GENERIC HISTORICAL  04/30/2016   IR IVC FILTER RETRIEVAL / S&I [name] GUID/MOD SED 04/30/2016 [name], MD [facility]INTER[facility]  . LITHOTRIPSY    . LUMBAR LAMINECTOMY/DECOMPRESSION MICRODISCECTOMY Right 09/14/2012   Procedure: Right Lumbar three-four, four-five, Lumbar five-Sacral one decompressive laminectomy;  Surgeon: [name], MD;  Location: [facility];  Service: Neurosurgery;  Laterality: Right;  . LUMBAR LAMINECTOMY/DECOMPRESSION MICRODISCECTOMY Left 03/11/2016   Procedure: Left Lumbar Two-ThreeMicrodiscectomy;  Surgeon: [name], MD;  Location: [facility];  Service: Neurosurgery;  Laterality: Left;  . PERIPHERAL VASCULAR INTERVENTION  11/18/2016   Procedure: PERIPHERAL VASCULAR INTERVENTION;  Surgeon: [name], MD;  Location: [place] [facility];  Service: Cardiovascular;;  Left popliteal  . SHOULDER  SURGERY Left 05/07/2009  . TONSILLECTOMY           Family History  Problem Relation Age of Onset  . Heart disease Mother   . Heart attack Mother   . Stroke Mother   . Heart disease Father   . Heart attack Father   . Stroke Father   . Breast cancer Sister        Twin   . Heart attack Sister   . Dementia Sister   . Heart disease Sister   . Heart attack Sister   . Clotting disorder Sister   . Heart attack Sister   . Colon cancer Neg Hx     Social History:  Married. Retired --used to be a Freight forwarder.  Independent PTA without AD. He reports that he has never smoked. He has never used smokeless tobacco. He reports that he does not drink alcohol or use drugs.         Allergies  Allergen Reactions  . Phenergan [Promethazine Hcl]  Swelling and Other (See Comments)    Cannot be combined with Zofran because swelling of the eyes resulted and patient became very restless-  . Zofran [Ondansetron Hcl] Swelling and Other (See Comments)    Cannot be combined with Phenergan because swelling of the eyes resulted and patient became very restless-  . Sulfamethoxazole Rash and Itching          Medications Prior to Admission  Medication Sig Dispense Refill  . brimonidine-timolol (COMBIGAN) 0.2-0.5 % ophthalmic solution Place 1 drop into both eyes every 12 (twelve) hours.    . cholecalciferol (VITAMIN D) 1000 units tablet Take 1,000 Units by mouth daily.     Marland Kitchen ELIQUIS 5 MG TABS tablet TAKE 1 TABLET (5 MG TOTAL) BY MOUTH 2 (TWO) TIMES DAILY. 60 tablet 3  . furosemide (LASIX) 20 MG tablet TAKE 1 TABLET BY MOUTH DAILY. 90 tablet 2  . imatinib (GLEEVEC) 100 MG tablet Take 300 mg by mouth daily at 12 noon. Take with meals and large glass of water.Caution:Chemotherapy     . isosorbide mononitrate (IMDUR) 30 MG 24 hr tablet Take 1 tablet (30 mg total) by mouth daily. 90 tablet 3  . lidocaine (LIDODERM) 5 % Place 1 patch onto the skin daily as needed (for back pain).  Remove & Discard patch within 12 hours or as directed by MD     . lisinopril (PRINIVIL,ZESTRIL) 5 MG tablet Take 1 tablet (5 mg total) by mouth daily. 30 tablet 11  . loperamide (IMODIUM) 2 MG capsule Take 2-4 mg by mouth 3 (three) times daily as needed for diarrhea or loose stools.     Marland Kitchen LORazepam (ATIVAN) 0.5 MG tablet TAKE 1 TABLET BY MOUTH AT BEDTIME AS NEEDED FOR SLEEP 30 tablet 3  . Melatonin 3 MG TABS Take 3 mg by mouth at bedtime as needed (for sleep).     . Multiple Vitamin (MULTIVITAMIN) tablet Take 1 tablet by mouth daily.    . ondansetron (ZOFRAN ODT) 8 MG disintegrating tablet Take 1 tablet (8 mg total) by mouth every 8 (eight) hours as needed for nausea or vomiting. 90 tablet 1  . predniSONE (DELTASONE) 1 MG tablet Take 3 tablets (3 mg total) by mouth daily with breakfast. 270 tablet 1  . pyridostigmine (MESTINON) 60 MG tablet Take 60 mg by mouth daily.    . ranitidine (ZANTAC) 150 MG tablet Take 150 mg by mouth 2 (two) times daily as needed for heartburn.    . rosuvastatin (CRESTOR) 10 MG tablet Take 5 mg by mouth daily.    Marland Kitchen tobramycin (TOBREX) 0.3 % ophthalmic solution Place 1 drop into both eyes 4 (four) times daily. FOR 3 WEEKS  3  . traMADol (ULTRAM) 50 MG tablet Take 2 tablets (100 mg total) by mouth every 8 (eight) hours. (Patient taking differently: Take 50 mg by mouth every 8 (eight) hours as needed (for pain). ) 15 tablet 0  . amoxicillin-clavulanate (AUGMENTIN) 875-125 MG tablet Take 1 tablet by mouth 2 (two) times daily. One po bid x 7 days (Patient not taking: Reported on 07/19/2017) 14 tablet 0  . Fluticasone-Salmeterol (ADVAIR DISKUS) 100-50 MCG/DOSE AEPB Inhale 1 puff into the lungs 2 (two) times daily. (Patient not taking: Reported on 07/19/2017) 1 each 1  . NITROSTAT 0.4 MG SL tablet PLACE 1 TABLET UNDER THE TONGUE EVERY 5 MINUTES X3 DOSES AS NEEDED FOR CHEST PAIN (Patient not taking: Reported on 07/19/2017) 100 tablet 1  . predniSONE (DELTASONE) 20 MG  tablet Take 1  tablet (20 mg total) by mouth 2 (two) times daily. (Patient not taking: Reported on 07/19/2017) 10 tablet 0  . promethazine-codeine (PHENERGAN WITH CODEINE) 6.25-10 MG/5ML syrup Take 2.5 mLs by mouth every 4 (four) hours as needed for cough. (Patient not taking: Reported on 07/19/2017) 120 mL 0    Home: Home Living Family/patient expects to be discharged to:: Private residence Living Arrangements: Spouse/significant other Available Help at Discharge: Family Type of Home: House Home Access: Stairs to enter Technical brewer of Steps: 3 Entrance Stairs-Rails: Right, Left Home Layout: Two level, Bed/bath upstairs Bathroom Shower/Tub: Multimedia programmer: Programmer, systems: Yes Home Equipment: Civil engineer, contracting - built in  Lives With: Spouse  Functional History: Prior Function Level of Independence: Independent Functional Status:  Mobility: Bed Mobility Overal bed mobility: Needs Assistance Bed Mobility: Rolling, Supine to Sit, Sit to Supine Rolling: Supervision Supine to sit: Min assist Sit to supine: Min assist General bed mobility comments: Supervision for safety Transfers Overall transfer level: Needs assistance Equipment used: 1 person hand held assist Transfers: Sit to/from Stand Sit to Stand: Min assist General transfer comment: Patient with heavy posterior list when attempting to rise to standing, required physical assist to perform transfer with multiple attempts to initiate power up Ambulation/Gait Ambulation/Gait assistance: Min assist, Max assist(6 episodes of LOB) Ambulation Distance (Feet): 130 Feet Assistive device: 1 person hand held assist Gait Pattern/deviations: Ataxic, Narrow base of support, Staggering right, Staggering left, Leaning posteriorly General Gait Details: patient with several LOb noted during ambulation despite hands on assist with wrap around gait belt  ADL: ADL Overall ADL's : Needs  assistance/impaired Grooming: Moderate assistance, Standing, Oral care, Wash/dry face, Wash/dry hands Grooming Details (indicate cue type and reason): pt with posterior lean. pt noted to have toes off the ground and posterior lean without awareness. pt does not attempt to self correct until therapist is already aiding with phsycial assistance. pt bracing on all surfaces with sit<>Stand transfer. pt does not recognize this and actually states "i think i am stronger and better today"  Lower Body Bathing: Moderate assistance, Sit to/from stand Toilet Transfer: Minimal assistance, RW, BSC Toilet Transfer Details (indicate cue type and reason): pt with less (A) requires with bathroom transfer with use of RW and but patient demonstrates safety errors with RW . pt walking outside of the frame of the RW. pt pushing RW to the side and attempting to reach to the lateral side of RW. pt needs mod cues for RW safety. pt again demonstrates posterior LOB with correction by therapist. pt attempting to exit the bathroom without (A).  Functional mobility during ADLs: Moderate assistance General ADL Comments: pt eager to get OOB and reports great night sleep. pt complete adl task at the sink demonstrating posterior LOB and decr fine/ gross motor . pt with tremor incr in intensity with movement for adl task. pt with lack of awareness. OT transfering patient without RW to assess balance further this session and bil LE buckle once and x2 LLE buckle. pt seems not alarmed by the event and reports feeling that he is better at this time.   Cognition: Cognition Overall Cognitive Status: Impaired/Different from baseline Arousal/Alertness: Awake/alert Orientation Level: Oriented X4 Attention: Alternating Alternating Attention: Appears intact Memory: Impaired Memory Impairment: Storage deficit, Retrieval deficit Awareness: Impaired Awareness Impairment: Emergent impairment Problem Solving: (tba) Safety/Judgment: Other  (comment) Comments: answered questions appropriately per testing; functional deficits noted per OT/PT Cognition Arousal/Alertness: Awake/alert Behavior During Therapy: WFL for tasks assessed/performed Overall Cognitive Status:  Impaired/Different from baseline Area of Impairment: Safety/judgement, Awareness Safety/Judgement: Decreased awareness of deficits, Decreased awareness of safety Awareness: Emergent General Comments: decreased awareness of deficits thorughout session   Blood pressure 140/60, pulse (!) 59, temperature 97.9 F (36.6 C), temperature source Oral, resp. rate 16, height 5\' 7"  (1.702 m), weight 80.1 kg (176 lb 9.4 oz), SpO2 100 %. Physical Exam  Vitals reviewed. Constitutional: He is oriented to person, place, and time. He appears well-developed and well-nourished.  HENT:  Head: Normocephalic and atraumatic.  Eyes: EOM are normal. Right eye exhibits no discharge. Left eye exhibits no discharge.  Injected sclera  Neck: Normal range of motion. Neck supple.  Cardiovascular: Normal rate and regular rhythm.  Respiratory: Effort normal and breath sounds normal.  GI: Soft. Bowel sounds are normal.  Musculoskeletal:  No edema or tenderness in extremities  Neurological: He is alert and oriented to person, place, and time.  Motor: 4--4/5 grossly throughout No ataxia Sensation intact to light touch  Skin: Skin is warm and dry.  Psychiatric: He has a normal mood and affect. His behavior is normal.             Assessment/Plan: Diagnosis: TIA with history of MG Labs and images independently reviewed.  Records reviewed and summated above.  1. Does the need for close, 24 hr/day medical supervision in concert with the patient's rehab needs make it unreasonable for this patient to be served in a less intensive setting? Potentially  2. Co-Morbidities requiring supervision/potential complications: T2 DM (Monitor in accordance with exercise and adjust meds as necessary), MG  (cont meds), history of PE, PAD (cont meds), CKD (avoid nephrotoxic meds), CML, history of cerebellar stroke, HTN (monitor and provide prns in accordance with increased physical exertion and pain), ABLA (transfuse if necessary to ensure appropriate perfusion for increased activity tolerance), Thrombocytopenia (< 60,000/mm3 no resistive exercise) 3. Due to safety, medication administration and patient education, does the patient require 24 hr/day rehab nursing? Potentially 4. Does the patient require coordinated care of a physician, rehab nurse, PT (1-2 hrs/day, 5 days/week) and OT (1-2 hrs/day, 5 days/week) to address physical and functional deficits in the context of the above medical diagnosis(es)? Potentially Addressing deficits in the following areas: balance, endurance, locomotion, strength, bathing, dressing, toileting and psychosocial support 5. Can the patient actively participate in an intensive therapy program of at least 3 hrs of therapy per day at least 5 days per week? Yes 6. The potential for patient to make measurable gains while on inpatient rehab is excellent 7. Anticipated functional outcomes upon discharge from inpatient rehab are supervision  with PT, supervision with OT, n/a with SLP. 8. Estimated rehab length of stay to reach the above functional goals is: 7-10 days. 9. Anticipated D/C setting: Home 10. Anticipated post D/C treatments: HH therapy and Home excercise program 11. Overall Rehab/Functional Prognosis: good  RECOMMENDATIONS: This patient's condition is appropriate for continued rehabilitative care in the following setting: Given diagnosis, anticipate patient with continue to make gradual improvements with time.  Recommend home with [facility] and caregiver support.  If pt does not continue to progress, recommend CIR. Patient has agreed to participate in recommended program. Potentially Note that insurance prior authorization may be required for reimbursement for recommended  care.  Comment: Rehab Admissions Coordinator to follow up.   I have personally performed a face to face diagnostic evaluation, including, but not limited to relevant history and physical exam findings, of this patient and developed relevant assessment and plan.  Additionally, I  have reviewed and concur with the physician assistant's documentation above.   Delice Lesch, MD, ABPMR Bary Leriche, PA-C 07/21/2017          Revision History                             Routing History

## 2017-07-23 NOTE — Progress Notes (Signed)
Rehab admissions - Noted patient with balance and ataxia issues with ambulation requiring up to max assist.  Discussed with Dr. Naaman Plummer and have approval for admit today.  I spoke with attending MD and patient has been medically cleared for admit to CIR today.  Bed available and will admit to CIR today.  Call me for questions.  #[phone]

## 2017-07-23 NOTE — Care Management Important Message (Signed)
Important Message  Patient Details  Name: [name] MRN: [id number] Date of Birth: [birth year]   Medicare Important Message Given:  Yes    [name] Montine Circle 07/23/2017, 3:57 PM

## 2017-07-23 NOTE — Progress Notes (Signed)
Pt with 26 beats of VTach. Alert and oriented.  Asymptomatic.  NP notified.

## 2017-07-23 NOTE — PMR Pre-admission (Signed)
PMR Admission Coordinator Pre-Admission Assessment  Patient: [name] is an 77 y.o., male MRN: [id number] DOB: 1940/08/05 Height: 5\' 7"  (170.2 cm) Weight: 80.1 kg (176 lb 9.4 oz)             Insurance Information HMO:  No   PPO:       PCP:       IPA:       80/20:       OTHER:   PRIMARY:  Medicare A and B      Policy#: [id number]      Subscriber: patient CM Name:        Phone#:       Fax#:   Pre-Cert#:        Employer: Retired Benefits:  Phone #:       Name: Checked in [place]. Date: 04/10/03     Deduct: $1364      Out of Pocket Max: None      Life Max: N/A CIR: 100%      SNF: 100 days Outpatient: 80%     Co-Pay: 20% Home Health: 100%      Co-Pay: none DME: 80%     Co-Pay: 20% Providers: patient's choice  SECONDARY: [employer]      Policy#: [id number]      Subscriber:  patient CM Name:        Phone#:       Fax#:   Pre-Cert#:        Employer: Retired Benefits:  Phone #: [phone]     Name:   Eff. Date:       Deduct:        Out of Pocket Max:        Life Max:   CIR:        SNF:   Outpatient:       Co-Pay:   Home Health:        Co-Pay:   DME:       Co-Pay:    Emergency Contact Information Contact Information    Name Relation Home Work Mobile   Grey,[name] Spouse [phone]  [phone]     Current Medical History  Patient Admitting Diagnosis:  TIA with H/O MG  History of Present Illness: A 77 y.o. male with history of T2DM, MG, PE, PAD, CKD, CML, cerebellar stroke who was admitted on 07/19/17 with sudden onset of N/V and difficulty walking. CT head negative for bleed but tPA not given as patient had taken Eliquis that am . CTA head/neck was negative for emergent large vessel occlusion with chronic occluded L-VA and chronic dissected or ulcerated R-ICA. MRI brain reviewed, unremarkable for acute intracranial process. Per report, chronic moderate to advanced small vessel disease, remote left cerebellar infarct and was negative for acute changes. 2D  echo showed EF 50-55 % with mild AVS. 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 To continue Eliquis, avoid dehydration and hypotension. Patient continues to be limited by BLE weakness with instability and has decline in functional status therefore CIR recommended for follow up therapy.   Total: 3=NIH  Past Medical History  Past Medical History:  Diagnosis Date  . Bruises easily    d/t being on Eliquis  . Carotid artery disease ([place])    a. Duplex 05/2014: 50-93% RICA, 2-67% LICA, elevated velocities in right subclavian artery, normal left subclavian artery..  . Chronic back  pain    HNP  . Chronic kidney disease (CKD)   . Claustrophobia    takes Ativan as needed  . 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 Coronary atherosclerosis of unspecified type of vessel, native or graft    a. cath in 2003 showed 10-20% LM, scattered 20% prox-mid LAD, 30% more distal LAD, 50-60% stenosis of LAD towards apex, 20% Cx, 30% dRCA, focal 95% stenosis in smaller of 2 branches of PDA, EF 60-65%.  . Diabetes mellitus ([place])   . Diarrhea    takes Imodium daily as needed  . Diverticulosis   . Diverticulosis of colon (without mention of hemorrhage)   . DJD (degenerative joint disease)   . DVT (deep venous thrombosis) ([place]) 07/2011  . Esophageal stricture   . Essential hypertension    takes Imdur and Lisinopril daily  . Gastric polyps    benign  . GERD (gastroesophageal reflux disease)    takes Nexium daily  . Glaucoma    uses eye drops  . History of bronchitis    not sure when the last time  . History of colon polyps    benign  . History of kidney stones   . History of kidney stones   . Insomnia    takes Melatonin nightly as needed  . Mixed hyperlipidemia    takes Crestor daily  . Myasthenia gravis ([place])    uses prednisone  . Osteoporosis 2016  .  Peripheral edema    takes Lasix daily  . Pituitary tumor    checks Dr.Walden at [facility] checks it every Jan   . Pneumonia 01/2016  . PONV (postoperative nausea and vomiting)   . Ptosis   . Pulmonary embolus ([place]) 2012  . Stroke [facility]) 06/2015    Family History  family history includes Breast cancer in his sister; Clotting disorder in his sister; Dementia in his sister; Heart attack in his father, mother, sister, sister, and sister; Heart disease in his father, mother, and sister; Stroke in his father and mother.  Prior Rehab/Hospitalizations: Had rehab 2 years ago after CVA and then had [place] therapies for follow up after CVA  Has the patient had major surgery during 100 days prior to admission? No  Current Medications   Current Facility-Administered Medications:  .   stroke: mapping our early stages of recovery book, , Does not apply, Once, Rai, Ripudeep K, MD .  acetaminophen (TYLENOL) tablet 650 mg, 650 mg, Oral, Q6H PRN **OR** acetaminophen (TYLENOL) suppository 650 mg, 650 mg, Rectal, Q6H PRN, Rai, Ripudeep K, MD .  apixaban (ELIQUIS) tablet 5 mg, 5 mg, Oral, BID, Wertman, Sara E, PA-C, 5 mg at 07/23/17 0851 .  bisacodyl (DULCOLAX) suppository 10 mg, 10 mg, Rectal, Daily PRN, Rai, Ripudeep K, MD .  brimonidine (ALPHAGAN) 0.2 % ophthalmic solution 1 drop, 1 drop, Both Eyes, BID, 1 drop at 07/23/17 0853 **AND** timolol (TIMOPTIC) 0.5 % ophthalmic solution 1 drop, 1 drop, Both Eyes, BID, Wertman, Coralee Pesa, PA-C, 1 drop at 07/23/17 0853 .  famotidine (PEPCID) tablet 20 mg, 20 mg, Oral, BID, Rondel Jumbo, PA-C, 20 mg at 07/23/17 0852 .  hydrALAZINE (APRESOLINE) injection 5 mg, 5 mg, Intravenous, Q8H PRN, Rai, Ripudeep K, MD .  imatinib (GLEEVEC) tablet 300 mg, 300 mg, Oral, Q1200, Samuella Cota, MD, 300 mg at 07/22/17 1406 .  insulin aspart (novoLOG) injection 0-9 Units, 0-9 Units, Subcutaneous, TID WC, Rai, Ripudeep K, MD, 1 Units at 07/22/17 1711 .  isosorbide mononitrate (IMDUR) 24  hr tablet 30 mg, 30 mg, Oral, Daily, Rondel Jumbo, PA-C, 30 mg at 07/23/17 0851 .  lidocaine (LIDODERM) 5 % 1 patch, 1 patch, Transdermal, Daily PRN, Rondel Jumbo, PA-C, 1 patch at 07/22/17 1014 .  LORazepam (ATIVAN) tablet 0.5 mg, 0.5 mg, Oral, QHS PRN, Rondel Jumbo, PA-C .  meclizine (ANTIVERT) tablet 25 mg, 25 mg, Oral, TID PRN, Rai, Ripudeep K, MD, 25 mg at 07/21/17 1222 .  [DISCONTINUED] ondansetron (ZOFRAN) tablet 4 mg, 4 mg, Oral, Q6H PRN **OR** ondansetron (ZOFRAN) injection 4 mg, 4 mg, Intravenous, Q6H PRN, Rai, Ripudeep K, MD .  predniSONE (DELTASONE) tablet 3 mg, 3 mg, Oral, Q breakfast, Garvin Fila, MD, 3 mg at 07/23/17 0851 .  pyridostigmine (MESTINON) tablet 60 mg, 60 mg, Oral, Daily, Rondel Jumbo, PA-C, 60 mg at 07/23/17 4315 .  rosuvastatin (CRESTOR) tablet 5 mg, 5 mg, Oral, q1800, Biby, Sharon L, NP, 5 mg at 07/22/17 1711 .  senna-docusate (Senokot-S) tablet 1 tablet, 1 tablet, Oral, QHS PRN, Rai, Ripudeep K, MD .  tobramycin (TOBREX) 0.3 % ophthalmic solution 1 drop, 1 drop, Both Eyes, QID, Wertman, Coralee Pesa, PA-C, 1 drop at 07/23/17 0852 .  traMADol (ULTRAM) tablet 50 mg, 50 mg, Oral, Q8H PRN, Shawn Route, Sara E, PA-C  Patients Current Diet:  Diet Order           Diet Carb Modified Fluid consistency: Thin; Room service appropriate? Yes  Diet effective now          Precautions / Restrictions Precautions Precautions: Fall Precaution Comments: L LE buckling   Has the patient had 2 or more falls or a fall with injury in the past year?No  Prior Activity Level Community (5-7x/wk): Went out daily to eat, was driving.    Home Assistive Devices / Equipment Home Assistive Devices/Equipment: Cane (specify quad or straight) Home Equipment: Shower seat - built in  Prior Device Use: Indicate devices/aids used by the patient prior to current illness, exacerbation or injury? Walking stick for longer distances  Prior Functional Level Prior Function Level of  Independence: Independent  Self Care: Did the patient need help bathing, dressing, using the toilet or eating?  Independent  Indoor Mobility: Did the patient need assistance with walking from room to room (with or without device)? Independent  Stairs: Did the patient need assistance with internal or external stairs (with or without device)? Independent  Functional Cognition: Did the patient need help planning regular tasks such as shopping or remembering to take medications? Independent  Current Functional Level Cognition  Arousal/Alertness: Awake/alert Overall Cognitive Status: Impaired/Different from baseline Orientation Level: Oriented X4 Safety/Judgement: Decreased awareness of deficits, Decreased awareness of safety General Comments: decreased awareness of deficits thorughout session Attention: Alternating Alternating Attention: Appears intact Memory: Impaired Memory Impairment: Storage deficit, Retrieval deficit Awareness: Impaired Awareness Impairment: Emergent impairment Problem Solving: (tba) Safety/Judgment: Other (comment) Comments: answered questions appropriately per testing; functional deficits noted per OT/PT    Extremity Assessment (includes Sensation/Coordination)  Upper Extremity Assessment: RUE deficits/detail RUE Deficits / Details: MMT 3 out 5 / tremor/ pt with over and undershooting with finger to nose (ataxic movement) pt needs incr time and decr speed to really attempt to place finger on his nose and then switch to therapists finger tip.  RUE Coordination: decreased fine motor, decreased gross motor(pt dropping top off the tooth paste) LUE Deficits / Details: MMT 3+ out 5/ noted to have a full body tremor that is not  limited to the left but is present . pt able to complete finger to nose smoothly  Lower Extremity Assessment: LLE deficits/detail RLE Coordination: decreased fine motor, decreased gross motor LLE Deficits / Details: pt buckling with transfer x2  this session with mod ()A for lob LLE Coordination: decreased fine motor, decreased gross motor    ADLs  Overall ADL's : Needs assistance/impaired Grooming: Moderate assistance, Standing, Oral care, Wash/dry face, Wash/dry hands Grooming Details (indicate cue type and reason): pt with posterior lean. pt noted to have toes off the ground and posterior lean without awareness. pt does not attempt to self correct until therapist is already aiding with phsycial assistance. pt bracing on all surfaces with sit<>Stand transfer. pt does not recognize this and actually states "i think i am stronger and better today"  Lower Body Bathing: Moderate assistance, Sit to/from stand Toilet Transfer: Minimal assistance, RW, BSC Toilet Transfer Details (indicate cue type and reason): pt with less (A) requires with bathroom transfer with use of RW and but patient demonstrates safety errors with RW . pt walking outside of the frame of the RW. pt pushing RW to the side and attempting to reach to the lateral side of RW. pt needs mod cues for RW safety. pt again demonstrates posterior LOB with correction by therapist. pt attempting to exit the bathroom without (A).  Functional mobility during ADLs: Moderate assistance General ADL Comments: pt eager to get OOB and reports great night sleep. pt complete adl task at the sink demonstrating posterior LOB and decr fine/ gross motor . pt with tremor incr in intensity with movement for adl task. pt with lack of awareness. OT transfering patient without RW to assess balance further this session and bil LE buckle once and x2 LLE buckle. pt seems not alarmed by the event and reports feeling that he is better at this time.     Mobility  Overal bed mobility: Needs Assistance Bed Mobility: Rolling, Supine to Sit, Sit to Supine Rolling: Supervision Supine to sit: Min assist Sit to supine: Min assist General bed mobility comments: pt sitting EOB upon arrival with family present     Transfers  Overall transfer level: Needs assistance Equipment used: 1 person hand held assist Transfers: Sit to/from Stand Sit to Stand: Min assist General transfer comment: several attempts to stand without assistance however pt unable to come into standing; assistance to rise and to gain balance upon standing;pt bracing himself against EOB    Ambulation / Gait / Stairs / Wheelchair Mobility  Ambulation/Gait Ambulation/Gait assistance: Min assist, Max assist(min-max A required) Gait Distance (Feet): 130 Feet Assistive device: None(assist with gait belt at trunk ) Gait Pattern/deviations: Ataxic, Narrow base of support, Staggering right, Staggering left General Gait Details: pt presents with ataxia and mulitple LOB requiring assistance to recover; pt pauses frequently during gait to regain balance; pt cannot tolerate challenges to gait including horizontal (especially to L) and vertical head turns; pt with scissoring when turning to L side but smoother turn going toward R side Gait velocity: decreased    Posture / Balance Dynamic Sitting Balance Sitting balance - Comments: when tremor starts pt requires bil UE to keep him in static standing. pt with posterior lob with tremor Balance Overall balance assessment: Needs assistance Sitting-balance support: Bilateral upper extremity supported, Feet supported Sitting balance-Leahy Scale: Fair Sitting balance - Comments: when tremor starts pt requires bil UE to keep him in static standing. pt with posterior lob with tremor Standing balance support: Bilateral upper extremity  supported, During functional activity Standing balance-Leahy Scale: Poor Standing balance comment: pt continues to realy on alternative assist for all aspects of standing.    Special needs/care consideration BiPAP/CPAP No CPM No Continuous Drip IV No Dialysis No      Life Vest No Oxygen No Special Bed No Trach Size No Wound Vac (area) No    Skin Bruises easily                             Bowel mgmt: Last BM 07/23/17 per patient Bladder mgmt: Voiding in bathroom with assist Diabetic mgmt Prediabetic per patient    Previous Home Environment Living Arrangements: Spouse/significant other  Lives With: Spouse Available Help at Discharge: Family Type of Home: House Home Layout: Two level, Bed/bath upstairs Home Access: Stairs to enter Entrance Stairs-Rails: Right, Left Entrance Stairs-Number of Steps: 3 Bathroom Shower/Tub: Multimedia programmer: Programmer, systems: Yes Jennette: Other (Comment)  Discharge Living Setting Plans for Discharge Living Setting: Patient's home, House, Lives with (comment)(Lives with wife.) Type of Home at Discharge: House Discharge Home Layout: Two level, Laundry or work area in basement, Able to live on main level with bedroom/bathroom(Split level home) Alternate Level Stairs-Number of Steps: 9 steps up to main level and 9 steps down to basement level Discharge Home Access: Stairs to enter Entrance Stairs-Number of Steps: 4 steps Does the patient have any problems obtaining your medications?: No  Social/Family/Support Systems Patient Roles: Spouse(Has a wife.) Contact Information: Tyheem Boughner - wife Anticipated Caregiver: wife Anticipated Caregiver's Contact Information: Hassan Rowan - wife - [phone] Ability/Limitations of Caregiver: wife is retired and can provide supervision. Caregiver Availability: 24/7 Discharge Plan Discussed with Primary Caregiver: Yes Is Caregiver In Agreement with Plan?: Yes Does Caregiver/Family have Issues with Lodging/Transportation while Pt is in Rehab?: No  Goals/Additional Needs Patient/Family Goal for Rehab: PT/OT supervision goals Expected length of stay: 7-10 days Cultural Considerations: [employer], attends church weekly Dietary Needs: Carb mod, med cal, thin liquids Equipment Needs: TBD Pt/Family Agrees to Admission and willing to participate:  Yes Program Orientation Provided & Reviewed with Pt/Caregiver Including Roles  & Responsibilities: Yes  Decrease burden of Care through IP rehab admission: N/A  Possible need for SNF placement upon discharge: Not anticipated  Patient Condition: This patient's medical and functional status has changed since the consult dated: 07/21/17 in which the Rehabilitation Physician determined and documented that the patient's condition is appropriate for intensive rehabilitative care in an inpatient rehabilitation facility. See "History of Present Illness" (above) for medical update. Functional changes are:  Currently requiring min to max assist with ambulation with balance issues, did ambulate 130 feet.  Patient's medical and functional status update has been discussed with the Rehabilitation physician and patient remains appropriate for inpatient rehabilitation. Will admit to inpatient rehab today.  Preadmission Screen Completed By:  Retta Diones, 07/23/2017 10:51 AM ______________________________________________________________________   Discussed status with Dr. Naaman Plummer on 07/23/17 at 1051 and received telephone approval for admission today.  Admission Coordinator:  Retta Diones, time 1051/Date 07/23/17

## 2017-07-23 NOTE — Care Management Note (Signed)
Case Management Note  Patient Details  Name: [name] MRN: [id number] Date of Birth: 1940-05-09  Subjective/Objective:                    Action/Plan: Pt discharging to CIR today. CM signing off.   Expected Discharge Date:  07/23/17               Expected Discharge Plan:  IP Rehab Facility  In-House Referral:  Clinical Social Work  Discharge planning Services  CM Consult  Post Acute Care Choice:    Choice offered to:     DME Arranged:    DME Agency:     HH Arranged:    [place] Agency:     Status of Service:  Completed, signed off  If discussed at [employer], dates discussed:    Additional Comments:  Pollie Friar, RN 07/23/2017, 3:47 PM

## 2017-07-24 ENCOUNTER — Inpatient Hospital Stay (HOSPITAL_COMMUNITY): Payer: Medicare Other | Admitting: Speech Pathology

## 2017-07-24 ENCOUNTER — Inpatient Hospital Stay (HOSPITAL_COMMUNITY): Payer: Medicare Other | Admitting: Occupational Therapy

## 2017-07-24 ENCOUNTER — Inpatient Hospital Stay (HOSPITAL_COMMUNITY): Payer: Medicare Other | Admitting: Physical Therapy

## 2017-07-24 DIAGNOSIS — I639 Cerebral infarction, unspecified: Secondary | ICD-10-CM

## 2017-07-24 LAB — COMPREHENSIVE METABOLIC PANEL
ALK PHOS: 45 U/L (ref 38–126)
ALT: 32 U/L (ref 17–63)
AST: 37 U/L (ref 15–41)
Albumin: 3.6 g/dL (ref 3.5–5.0)
Anion gap: 4 — ABNORMAL LOW (ref 5–15)
BILIRUBIN TOTAL: 0.8 mg/dL (ref 0.3–1.2)
BUN: 10 mg/dL (ref 6–20)
CHLORIDE: 109 mmol/L (ref 101–111)
CO2: 29 mmol/L (ref 22–32)
CREATININE: 1.45 mg/dL — AB (ref 0.61–1.24)
Calcium: 8.9 mg/dL (ref 8.9–10.3)
GFR calc Af Amer: 51 mL/min — ABNORMAL LOW (ref >60)
GFR, EST NON AFRICAN AMERICAN: 44 mL/min — AB (ref >60)
Glucose, Bld: 102 mg/dL — ABNORMAL HIGH (ref 65–99)
Potassium: 4 mmol/L (ref 3.5–5.1)
Sodium: 142 mmol/L (ref 135–145)
Total Protein: 5.6 g/dL — ABNORMAL LOW (ref 6.5–8.1)

## 2017-07-24 LAB — CBC WITH DIFFERENTIAL/PLATELET
ABS IMMATURE GRANULOCYTES: 0 10*3/uL (ref 0.0–0.1)
Basophils Absolute: 0 10*3/uL (ref 0.0–0.1)
Basophils Relative: 1 %
Eosinophils Absolute: 0.1 10*3/uL (ref 0.0–0.7)
Eosinophils Relative: 1 %
HEMATOCRIT: 38.2 % — AB (ref 39.0–52.0)
HEMOGLOBIN: 12.6 g/dL — AB (ref 13.0–17.0)
IMMATURE GRANULOCYTES: 1 %
Lymphocytes Relative: 22 %
Lymphs Abs: 1.8 10*3/uL (ref 0.7–4.0)
MCH: 32.1 pg (ref 26.0–34.0)
MCHC: 33 g/dL (ref 30.0–36.0)
MCV: 97.2 fL (ref 78.0–100.0)
MONOS PCT: 11 %
Monocytes Absolute: 0.9 10*3/uL (ref 0.1–1.0)
NEUTROS ABS: 5.4 10*3/uL (ref 1.7–7.7)
NEUTROS PCT: 64 %
PLATELETS: 170 10*3/uL (ref 150–400)
RBC: 3.93 MIL/uL — ABNORMAL LOW (ref 4.22–5.81)
RDW: 14.3 % (ref 11.5–15.5)
WBC: 8.3 10*3/uL (ref 4.0–10.5)

## 2017-07-24 LAB — GLUCOSE, CAPILLARY
GLUCOSE-CAPILLARY: 94 mg/dL (ref 65–99)
Glucose-Capillary: 106 mg/dL — ABNORMAL HIGH (ref 65–99)
Glucose-Capillary: 144 mg/dL — ABNORMAL HIGH (ref 65–99)
Glucose-Capillary: 139 mg/dL — ABNORMAL HIGH (ref 65–99)

## 2017-07-24 NOTE — Progress Notes (Signed)
Physical Therapy Assessment and Plan  Patient Details  Name: [name] MRN: [id number] Date of Birth: Dec 02, 1940  PT Diagnosis: Abnormality of gait, Ataxia, Ataxic gait, Difficulty walking, Hemiplegia dominant, Impaired cognition and Muscle weakness Rehab Potential: Good ELOS: 10-14 days   Today's Date: 07/24/2017 PT Individual Time: 1300-1415 PT Individual Time Calculation (min): 75 min    Problem List:  Patient Active Problem List   Diagnosis Date Noted  . Transient ischemic attack (TIA) 07/23/2017  . Subcortical infarction ([place])   . Right hemiparesis ([place])   . Ataxia 07/21/2017  . TIA (transient ischemic attack)   . Thrombocytopenia ([place])   . Wheezing 06/17/2017  . Bursitis of left elbow 05/01/2017  . Dystonia 12/29/2016  . PAD (peripheral artery disease) ([place]) 11/18/2016  . Osteoporosis 08/28/2016  . Paronychia of great toe, left 08/10/2016  . Carotid arterial disease ([place]) 07/09/2016  . Hypocalcemia 07/09/2016  . Diplopia 07/02/2016  . S/P lumbar laminectomy 03/11/2016  . Radicular pain of left lower extremity 02/21/2016  . Cough 01/10/2016  . 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 Myasthenia gravis ([place])   . Diabetes mellitus type 2 in nonobese (HCC)   . Coronary artery disease involving native coronary artery of native heart without angina pectoris   . Acute blood loss anemia   . Ataxia, post-stroke   . Gait disturbance, post-stroke   . Proximal leg weakness   . Cervical spondylosis without myelopathy   . Achilles tendinitis of right lower extremity   . Chronic diastolic CHF (congestive heart failure) ([place])   . Gastroesophageal reflux disease without esophagitis   . Chronic kidney disease   . Cerebral infarction involving left cerebellar artery ([place]) 06/20/2015  . Cerebellar stroke ([place])   . Ischemic stroke ([place])   . Cerebrovascular accident (CVA) due to thrombosis of left vertebral artery ([place])   . History of pulmonary embolus (PE)    . Chronic anticoagulation   . HLD (hyperlipidemia)   . Neck pain 03/19/2015  . Pain in the chest   . 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[place])   . Chronic diastolic heart failure, NYHA class 1 ([place]) 03/07/2015  . Diabetic gastroparesis ([place]) 03/07/2015  . Screening for osteoporosis 07/19/2014  . Edema 04/09/2014  . Routine general medical examination at a health care facility 10/23/2013  . Bilateral carotid bruits 05/24/2013  . Abnormal CT scan, lung 05/24/2013  . Encounter for therapeutic drug monitoring 03/14/2013  . Lymphadenopathy, inguinal 01/11/2013  . AKI (acute kidney injury) ([place]) 10/28/2012  . 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 Myasthenia gravis with exacerbation ([place]) 03/31/2012  . Disease of salivary gland 12/09/2011  . Left facial pain 10/28/2011  . Pulmonary embolism ([place]) 10/25/2011  . 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 GERD with stricture 09/23/2011  . Long term (current) use of anticoagulants 08/03/2011  . History of pulmonary embolism 07/30/2011  . History of DVT (deep vein thrombosis) 07/30/2011  . Subconjunctival hemorrhage 06/22/2011  . Bilateral conjunctivitis 04/09/2011  . Diabetes mellitus type 2, controlled ([place]) 12/18/2010  . Hearing loss 08/04/2010  . Ptosis of eyelid 12/30/2009  . Essential hypertension 04/24/2009  . HYPERLIPIDEMIA, MIXED 10/05/2008  . HEMORRHOIDS-INTERNAL 07/02/2008  . HEMORRHOIDS-EXTERNAL 07/02/2008  . DIVERTICULOSIS-COLON 07/02/2008  . PERSONAL HX COLONIC POLYPS 07/02/2008  . RASH-NONVESICULAR 02/07/2008  . PNEUMONIA 12/08/2007  . 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 CAD (coronary artery disease) 07/07/2007  . NEPHROLITHIASIS, HX OF 07/07/2007  Past Medical History:  Past Medical History:  Diagnosis Date  . Bruises easily    d/t being on Eliquis  . Carotid artery disease ([place])    a. Duplex 05/2014: 34-19% RICA, 6-22% LICA, elevated velocities in right  subclavian artery, normal left subclavian artery..  . Chronic back pain    HNP  . Chronic kidney disease (CKD)   . Claustrophobia    takes Ativan as needed  . 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 Coronary atherosclerosis of unspecified type of vessel, native or graft    a. cath in 2003 showed 10-20% LM, scattered 20% prox-mid LAD, 30% more distal LAD, 50-60% stenosis of LAD towards apex, 20% Cx, 30% dRCA, focal 95% stenosis in smaller of 2 branches of PDA, EF 60-65%.  . Diabetes mellitus ([place])   . Diarrhea    takes Imodium daily as needed  . Diverticulosis   . Diverticulosis of colon (without mention of hemorrhage)   . DJD (degenerative joint disease)   . DVT (deep venous thrombosis) ([place]) 07/2011  . Esophageal stricture   . Essential hypertension    takes Imdur and Lisinopril daily  . Gastric polyps    benign  . GERD (gastroesophageal reflux disease)    takes Nexium daily  . Glaucoma    uses eye drops  . History of bronchitis    not sure when the last time  . History of colon polyps    benign  . History of kidney stones   . History of kidney stones   . Insomnia    takes Melatonin nightly as needed  . Mixed hyperlipidemia    takes Crestor daily  . Myasthenia gravis ([place])    uses prednisone  . Osteoporosis 2016  . Peripheral edema    takes Lasix daily  . Pituitary tumor    [facility]Walden [facility] checks it every Jan   . Pneumonia 01/2016  . PONV (postoperative nausea and vomiting)   . Ptosis   . Pulmonary embolus ([place]) 2012  . Stroke [facility]) 06/2015   Past Surgical History:  Past Surgical History:  Procedure Laterality Date  . ABDOMINAL AORTOGRAM W/LOWER EXTREMITY N/A 11/18/2016   Procedure: ABDOMINAL AORTOGRAM W/LOWER EXTREMITY;  Surgeon: [name], MD;  Location: [facility];  Service: Cardiovascular;  Laterality: N/A;  . BACK SURGERY     x2, lumbar and cervical  . CARDIAC CATHETERIZATION    . CATARACT EXTRACTION  Bilateral 12/12  . COLONOSCOPY    . ESOPHAGOGASTRODUODENOSCOPY ENDOSCOPY  04/2015  . IR GENERIC HISTORICAL  03/03/2016   IR IVC FILTER PLMT / S&I [name] GUID/MOD SED 03/03/2016 [name], MD [facility]-INTERV RAD  . IR GENERIC HISTORICAL  04/09/2016   IR RADIOLOGIST EVAL & MGMT 04/09/2016 [name], MD [facility] INTER[facility] RAD  . IR GENERIC HISTORICAL  04/30/2016   IR IVC FILTER RETRIEVAL / S&I [name] GUID/MOD SED 04/30/2016 [name], MD [facility]-INTERV RAD  . LITHOTRIPSY    . LUMBAR LAMINECTOMY/DECOMPRESSION MICRODISCECTOMY Right 09/14/2012   Procedure: Right Lumbar three-four, four-five, Lumbar five-Sacral one decompressive laminectomy;  Surgeon: [name], MD;  Location: [place] [facility];  Service: Neurosurgery;  Laterality: Right;  . LUMBAR LAMINECTOMY/DECOMPRESSION MICRODISCECTOMY Left 03/11/2016   Procedure: Left Lumbar Two-ThreeMicrodiscectomy;  Surgeon: [name], MD;  Location: [facility];  Service: Neurosurgery;  Laterality: Left;  . PERIPHERAL VASCULAR INTERVENTION  11/18/2016   Procedure: PERIPHERAL VASCULAR INTERVENTION;  Surgeon: [name], MD;  Location: [facility]  INVASIVE CV LAB;  Service: Cardiovascular;;  Left popliteal  . SHOULDER SURGERY Left 05/07/2009  . TONSILLECTOMY      Assessment & Plan Clinical Impression:  [name] is a 77 year old male with history of T2DM, MG, PE, PAD, CKD, CML, cerebellar stroke who was admitted on 07/19/17 with sudden onset of N/V and difficulty walking. CT head was negative for bleed but TPA not given as patient had taken Eliquis that a.m. 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 MRI brain was negative for acute intracranial process. And showed chronic moderate to advanced small vessel disease. 2D echo done showing EF of 50 to 55% with mild ABS. Dr. Leonie Man felt that patient had "TIA or recrudescence of previous stroke symptoms from failure of  collateral circulation to occluded left vertebral artery and should improve with time". He recommended continuing Eliquis, avoiding dehydration and hypotension. Patient continues to be limited by bilateral lower extremity weakness with ataxia and instability he has had decline in functional status that was CIR was recommended for follow-up therapy   Patient transferred to [facility] on 07/23/2017 .   Patient currently requires min with mobility secondary to muscle weakness, ataxia and decreased coordination and decreased safety awareness.  Prior to hospitalization, patient was independent  with mobility and lived with Spouse in a House home.  Home access is 3Stairs to enter.  Patient will benefit from skilled PT intervention to maximize safe functional mobility, minimize fall risk and decrease caregiver burden for planned discharge home with 24 hour assist.  Anticipate patient will benefit from follow up [facility] at discharge.  PT - End of Session Activity Tolerance: Tolerates 10 - 20 min activity with multiple rests Endurance Deficit: Yes Endurance Deficit Description: multiple rest breaks btwn standing activities PT Assessment Rehab Potential (ACUTE/IP ONLY): Good PT Barriers to Discharge: Home environment access/layout PT Patient demonstrates impairments in the following area(s): Balance;Endurance;Motor;Safety PT Transfers Functional Problem(s): Bed Mobility;Bed to Chair;Car;Furniture;Floor PT Locomotion Functional Problem(s): Ambulation;Stairs PT Plan PT Intensity: Minimum of 1-2 x/day ,45 to 90 minutes PT Frequency: 5 out of 7 days PT Duration Estimated Length of Stay: 10-14 days PT Treatment/Interventions: Ambulation/gait training;Balance/vestibular training;Community reintegration;Discharge planning;Disease management/prevention;DME/adaptive equipment instruction;Functional mobility training;Neuromuscular re-education;Pain management;Patient/family education;Psychosocial  support;Splinting/orthotics;Stair training;Therapeutic Activities;Therapeutic Exercise;UE/LE Strength taining/ROM;UE/LE Coordination activities PT Transfers Anticipated Outcome(s): Supervision with LRAD PT Locomotion Anticipated Outcome(s): Supervision with LRAD PT Recommendation Recommendations for Other Services: Therapeutic Recreation consult Therapeutic Recreation Interventions: Pet therapy;Stress management Follow Up Recommendations: Home health PT Patient destination: Home Equipment Recommended: To be determined Equipment Details: RW vs no AD  Skilled Therapeutic Intervention Evaluation completed (see details above and below) with education on PT POC and goals and individual treatment initiated with focus on functional transfers, gait training with and without RW, balance assessment, and patient/family education on rehab expectations and prognosis. Pt performs bed mobility with Supervision, stand pivot transfers with min A with/without RW. Ambulation x 50 ft with handrail and HH min A, very ataxic and needs max v/c to widen BOS and decrease scissoring of RLE. Pt shows poor awareness of his gait pattern and is unaware of the fact that he would fall if therapist was not assisting him. Ambulation x 50 ft with RW and min A, improved balance with continued ataxia, scissoring of RLE, and R toe drag. Pt again is unaware that RLE is not clearing floor with gait. Pt is very impulsive and continually asks why  he is not safe to get up on his own and able to go home, very poor insight into deficits. Ascend/descend 4 stairs with 2 handrails and min A, step-to gait pattern. Car transfer with min A with v/c for safe RW use. Berg Balance Test 17/56, TUG 26 seconds. Reviewed results of balance tests with patient and wife, pt questions why he scored so low on tests but once results are reviewed he shows more insight into the fact that he does have balance deficits and will benefit from rehab stay. Pt left seated in  w/c in room with needs in reach, quick release belt and chair alarm in place, wife present.  PT Evaluation Precautions/Restrictions Precautions Precautions: Fall Restrictions Weight Bearing Restrictions: No Pain Pain Assessment Pain Scale: 0-10 Pain Score: 0-No pain Home Living/Prior Functioning Home Living Available Help at Discharge: Family;Available 24 hours/day Type of Home: House Home Access: Stairs to enter [employer] of Steps: 3 Entrance Stairs-Rails: Right;Left Home Layout: Two level;Bed/bath upstairs Alternate Level Stairs-Number of Steps: 9 Alternate Level Stairs-Rails: Left  Lives With: Spouse Prior Function Level of Independence: Independent with gait;Independent with transfers  Able to Take Stairs?: Yes Driving: Yes Vocation: Retired Comments: Enjoys spending time with family Vision/Perception  Vision - History Baseline Vision: Wears glasses only for reading Perception Perception: Within Functional Limits Praxis Praxis: Intact  Cognition Overall Cognitive Status: History of cognitive impairments - at baseline Arousal/Alertness: Awake/alert Orientation Level: Oriented X4 Attention: Selective Selective Attention: Appears intact Memory: Impaired Memory Impairment: Decreased recall of new information Awareness: Appears intact Problem Solving: Appears intact Behaviors: Impulsive Safety/Judgment: Impaired Comments: poor awareness of balance deficits and need for therapy, believes he is capable of getting up by himself without falling Sensation Sensation Light Touch: Appears Intact Proprioception: Appears Intact Coordination Gross Motor Movements are Fluid and Coordinated: No Heel Shin Test: impaired B Motor  Motor Motor: Hemiplegia;Ataxia Motor - Skilled Clinical Observations: RLE weakness, RLE coordination impairments and ataxia  Mobility Bed Mobility Bed Mobility: Supine to Sit;Sit to Supine Supine to Sit: Supervision/Verbal  cueing Sit to Supine: Supervision/Verbal cueing Transfers Transfers: Sit to Stand;Stand to Sit;Stand Pivot Transfers Sit to Stand: Minimal Assistance - Patient > 75% Stand to Sit: Minimal Assistance - Patient > 75% Stand Pivot Transfers: Minimal Assistance - Patient > 75% Stand Pivot Transfer Details: Verbal cues for sequencing;Verbal cues for technique;Verbal cues for precautions/safety;Manual facilitation for weight shifting Transfer (Assistive device): Rolling walker Locomotion  Gait Gait Distance (Feet): 50 Feet Assistive device: Rolling walker Gait Gait Pattern: Impaired Ankle - Swing Phase- Impaired Gait Pattern: Drag - Right;Decreased foot clearance - Right Gait velocity: decreased Stairs / Additional Locomotion Stairs: Yes Stairs Assistance: Minimal Assistance - Patient > 75% Stair Management Technique: Two rails;Step to pattern Number of Stairs: 4 Height of Stairs: 6 Wheelchair Mobility Wheelchair Mobility: No  Trunk/Postural Assessment  Cervical Assessment Cervical Assessment: Within Functional Limits Thoracic Assessment Thoracic Assessment: Within Functional Limits Lumbar Assessment Lumbar Assessment: Within Functional Limits Postural Control Postural Control: Deficits on evaluation Righting Reactions: impaired Protective Responses: impaired  Balance Balance Balance Assessed: Yes Standardized Balance Assessment Standardized Balance Assessment: Berg Balance Test;Timed Up and Go Test Berg Balance Test Sit to Stand: Needs minimal aid to stand or to stabilize Standing Unsupported: Able to stand 30 seconds unsupported Sitting with Back Unsupported but Feet Supported on Floor or Stool: Able to sit safely and securely 2 minutes Stand to Sit: Uses backs of legs against chair to control descent Transfers: Needs one person to assist Standing  Unsupported with Eyes Closed: Able to stand 10 seconds with supervision Standing Ubsupported with Feet Together: Able to place  feet together independently but unable to hold for 30 seconds From Standing, Reach Forward with Outstretched Arm: Loses balance while trying/requires external support From Standing Position, Pick up Object from Floor: Unable to try/needs assist to keep balance From Standing Position, Turn to Look Behind Over each Shoulder: Needs assist to keep from losing balance and falling Turn 360 Degrees: Needs assistance while turning Standing Unsupported, Alternately Place Feet on Step/Stool: Needs assistance to keep from falling or unable to try Standing Unsupported, One Foot in Front: Needs help to step but can hold 15 seconds Standing on One Leg: Tries to lift leg/unable to hold 3 seconds but remains standing independently Total Score: 17 Timed Up and Go Test TUG: Normal TUG Normal TUG (seconds): 26 Static Sitting Balance Static Sitting - Level of Assistance: 5: Stand by assistance Dynamic Sitting Balance Dynamic Sitting - Balance Support: Feet supported Dynamic Sitting - Level of Assistance: 5: Stand by assistance Static Standing Balance Static Standing - Balance Support: During functional activity Static Standing - Level of Assistance: 4: Min assist Dynamic Standing Balance Dynamic Standing - Balance Support: During functional activity;Bilateral upper extremity supported Dynamic Standing - Level of Assistance: 4: Min assist Dynamic Standing - Balance Activities: Reaching for objects Extremity Assessment   RLE Assessment RLE Assessment: Exceptions to [facility] Passive Range of Motion (PROM) Comments: limited knee ext due to hamstring tightness Active Range of Motion (AROM) Comments: limited knee ext due to hamstring tightness General Strength Comments: see below RLE Strength Right Hip Flexion: 4-/5 Right Knee Flexion: 3/5 Right Knee Extension: 3/5 Right Ankle Dorsiflexion: 3/5 LLE Assessment LLE Assessment: Exceptions to [facility] Passive Range of Motion (PROM) Comments: limited knee ext due to  hamstring tightness Active Range of Motion (AROM) Comments: limited knee ext due to hamstring tightness General Strength Comments: see below LLE Strength Left Hip Flexion: 4/5 Left Knee Flexion: 4/5 Left Knee Extension: 5/5 Left Ankle Dorsiflexion: 5/5   See Function Navigator for Current Functional Status.   Refer to Care Plan for Long Term Goals  Recommendations for other services: Therapeutic Recreation  Pet therapy and Stress management  Discharge Criteria: Patient will be discharged from PT if patient refuses treatment 3 consecutive times without medical reason, if treatment goals not met, if there is a change in medical status, if patient makes no progress towards goals or if patient is discharged from hospital.  The above assessment, treatment plan, treatment alternatives and goals were discussed and mutually agreed upon: by patient and by family  Excell Seltzer, PT, DPT  07/24/2017, 3:40 PM

## 2017-07-24 NOTE — Evaluation (Signed)
Speech Language Pathology Assessment and Plan  Patient Details  Name: [name] MRN: [id number] Date of Birth: October 27, 1940    Today's Date: 07/24/2017 SLP Individual Time: 1030-1120 SLP Individual Time Calculation (min): 50 min   Problem List:  Patient Active Problem List   Diagnosis Date Noted  . Transient ischemic attack (TIA) 07/23/2017  . Subcortical infarction ([place])   . Right hemiparesis ([place])   . Ataxia 07/21/2017  . TIA (transient ischemic attack)   . Thrombocytopenia ([place])   . Wheezing 06/17/2017  . Bursitis of left elbow 05/01/2017  . Dystonia 12/29/2016  . PAD (peripheral artery disease) ([place]) 11/18/2016  . Osteoporosis 08/28/2016  . Paronychia of great toe, left 08/10/2016  . Carotid arterial disease ([place]) 07/09/2016  . Hypocalcemia 07/09/2016  . Diplopia 07/02/2016  . S/P lumbar laminectomy 03/11/2016  . Radicular pain of left lower extremity 02/21/2016  . Cough 01/10/2016  . 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 Myasthenia gravis ([place])   . Diabetes mellitus type 2 in nonobese (HCC)   . Coronary artery disease involving native coronary artery of native heart without angina pectoris   . Acute blood loss anemia   . Ataxia, post-stroke   . Gait disturbance, post-stroke   . Proximal leg weakness   . Cervical spondylosis without myelopathy   . Achilles tendinitis of right lower extremity   . Chronic diastolic CHF (congestive heart failure) ([place])   . Gastroesophageal reflux disease without esophagitis   . Chronic kidney disease   . Cerebral infarction involving left cerebellar artery ([place]) 06/20/2015  . Cerebellar stroke ([place])   . Ischemic stroke ([place])   . Cerebrovascular accident (CVA) due to thrombosis of left vertebral artery ([place])   . History of pulmonary embolus (PE)   . Chronic anticoagulation   . HLD (hyperlipidemia)   . Neck pain 03/19/2015  . Pain in the chest   . Well controlled type 2 diabetes mellitus with  gastroparesis ([place])   . Chronic diastolic heart failure, NYHA class 1 ([place]) 03/07/2015  . Diabetic gastroparesis ([place]) 03/07/2015  . Screening for osteoporosis 07/19/2014  . Edema 04/09/2014  . Routine general medical examination at a health care facility 10/23/2013  . Bilateral carotid bruits 05/24/2013  . Abnormal CT scan, lung 05/24/2013  . Encounter for therapeutic drug monitoring 03/14/2013  . Lymphadenopathy, inguinal 01/11/2013  . AKI (acute kidney injury) ([place]) 10/28/2012  . 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 Myasthenia gravis with exacerbation ([place]) 03/31/2012  . Disease of salivary gland 12/09/2011  . Left facial pain 10/28/2011  . Pulmonary embolism ([place]) 10/25/2011  . 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 GERD with stricture 09/23/2011  . Long term (current) use of anticoagulants 08/03/2011  . History of pulmonary embolism 07/30/2011  . History of DVT (deep vein thrombosis) 07/30/2011  . Subconjunctival hemorrhage 06/22/2011  . Bilateral conjunctivitis 04/09/2011  . Diabetes mellitus type 2, controlled ([place]) 12/18/2010  . Hearing loss 08/04/2010  . Ptosis of eyelid 12/30/2009  . Essential hypertension 04/24/2009  . HYPERLIPIDEMIA, MIXED 10/05/2008  . HEMORRHOIDS-INTERNAL 07/02/2008  . HEMORRHOIDS-EXTERNAL 07/02/2008  . DIVERTICULOSIS-COLON 07/02/2008  . PERSONAL HX COLONIC POLYPS 07/02/2008  . RASH-NONVESICULAR 02/07/2008  . PNEUMONIA 12/08/2007  . 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 CAD (coronary artery disease) 07/07/2007  . NEPHROLITHIASIS, HX OF 07/07/2007   Past Medical History:  Past Medical History:  Diagnosis Date  . Bruises easily    d/t being on Eliquis  .  Carotid artery disease ([place])    a. Duplex 05/2014: 03-70% RICA, 4-88% LICA, elevated velocities in right subclavian artery, normal left subclavian artery..  . Chronic back pain    HNP  . Chronic kidney disease (CKD)   . Claustrophobia    takes Ativan as needed   . 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 Coronary atherosclerosis of unspecified type of vessel, native or graft    a. cath in 2003 showed 10-20% LM, scattered 20% prox-mid LAD, 30% more distal LAD, 50-60% stenosis of LAD towards apex, 20% Cx, 30% dRCA, focal 95% stenosis in smaller of 2 branches of PDA, EF 60-65%.  . Diabetes mellitus ([place])   . Diarrhea    takes Imodium daily as needed  . Diverticulosis   . Diverticulosis of colon (without mention of hemorrhage)   . DJD (degenerative joint disease)   . DVT (deep venous thrombosis) ([place]) 07/2011  . Esophageal stricture   . Essential hypertension    takes Imdur and Lisinopril daily  . Gastric polyps    benign  . GERD (gastroesophageal reflux disease)    takes Nexium daily  . Glaucoma    uses eye drops  . History of bronchitis    not sure when the last time  . History of colon polyps    benign  . History of kidney stones   . History of kidney stones   . Insomnia    takes Melatonin nightly as needed  . Mixed hyperlipidemia    takes Crestor daily  . Myasthenia gravis ([place])    uses prednisone  . Osteoporosis 2016  . Peripheral edema    takes Lasix daily  . Pituitary tumor    [facility]Walden at [facility] checks it every Jan   . Pneumonia 01/2016  . PONV (postoperative nausea and vomiting)   . Ptosis   . Pulmonary embolus ([place]) 2012  . Stroke [facility]) 06/2015   Past Surgical History:  Past Surgical History:  Procedure Laterality Date  . ABDOMINAL AORTOGRAM W/LOWER EXTREMITY N/A 11/18/2016   Procedure: ABDOMINAL AORTOGRAM W/LOWER EXTREMITY;  Surgeon: [name], MD;  Location: [place] CV LAB;  Service: Cardiovascular;  Laterality: N/A;  . BACK SURGERY     x2, lumbar and cervical  . CARDIAC CATHETERIZATION    . CATARACT EXTRACTION Bilateral 12/12  . COLONOSCOPY    . ESOPHAGOGASTRODUODENOSCOPY ENDOSCOPY  04/2015  . IR GENERIC HISTORICAL  03/03/2016   IR IVC FILTER PLMT / S&I [name] GUID/MOD  SED 03/03/2016 [name], MD [facility]INTER[facility] RAD  . IR GENERIC HISTORICAL  04/09/2016   IR RADIOLOGIST EVAL & MGMT 04/09/2016 [name], MD [facility] INTER[facility] RAD  . IR GENERIC HISTORICAL  04/30/2016   IR IVC FILTER RETRIEVAL / S&I [name] GUID/MOD SED 04/30/2016 [name], MD [facility]-INTERV RAD  . LITHOTRIPSY    . LUMBAR LAMINECTOMY/DECOMPRESSION MICRODISCECTOMY Right 09/14/2012   Procedure: Right Lumbar three-four, four-five, Lumbar five-Sacral one decompressive laminectomy;  Surgeon: [name], MD;  Location: [place]URO OR[facility];  Service: Neurosurgery;  Laterality: Right;  . LUMBAR LAMINECTOMY/DECOMPRESSION MICRODISCECTOMY Left 03/11/2016   Procedure: Left Lumbar Two-ThreeMicrodiscectomy;  Surgeon: [name], MD;  Location: [facility];  Service: Neurosurgery;  Laterality: Left;  . PERIPHERAL VASCULAR INTERVENTION  11/18/2016   Procedure: PERIPHERAL VASCULAR INTERVENTION;  Surgeon: [name], MD;  Location: [place] CV LAB;  Service: Cardiovascular;;  Left popliteal  . SHOULDER SURGERY Left 05/07/2009  . TONSILLECTOMY  Assessment / Plan / Recommendation Clinical Impression   ALEXANDRE FARIES is a 77 year old male with history of T2DM, MG, PE, PAD, CKD, CML, cerebellar stroke who was admitted on 07/19/17 with sudden onset of N/V and difficulty walking. CT head was negative for bleed but TPA not given as patient had taken Eliquis that a.m. 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 MRI brain was negative for acute intracranial process. And showed chronic moderate to advanced small vessel disease. 2D echo done showing EF of 50 to 55% with mild ABS. Dr. 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 He recommended continuing Eliquis, avoiding dehydration and  hypotension. Patient continues to be limited by bilateral lower extremity weakness with ataxia and instability he has had decline in functional status that was CIR was recommended for follow-up therapy.  SLP evaluation was completed on 07/24/2017 with the following results:   Pt presents with mild cognitive-linguistic deficits which appear to be baseline in nature and are characterized by impaired recall of new information and an atypical speech pattern.  Pt continues to have long pauses in the middle of his sentences but his speech is otherwise fluent without evidence of word finding difficulty.  Pt attributes these pauses to "having to think more" about what he wants to say and reports that they are not new.   Pt verbalizes good safety awareness regarding his balance and the need for quick release belt around his wheelchair; however, OT report indicates decreased functional safety awareness.  RN reports no overt safety concerns this shift or given in report.    Given that these deficits appear to be baseline, no further ST needs are indicated at this time.  Should pt experience increased difficulty in his home environment in regards to his cognitive-linguistic function, home health ST evaluation may be beneficial.       Skilled Therapeutic Interventions          Cognitive-linguistic evaluation completed with results and recommendations reviewed with patient     SLP Assessment  Patient does not need any further Speech Lanaguage Pathology Services    Recommendations  Follow up Recommendations: None Equipment Recommended: None recommended by SLP           Pain Pain Assessment Pain Scale: 0-10 Pain Score: 0-No pain  Prior Functioning Cognitive/Linguistic Baseline: Baseline deficits Baseline deficit details: pt reports baseline memory and word finding difficulty, possibly related to moderate to advanced small vessel disease evident on CT Type of Home: House  Lives With: Spouse Available Help  at Discharge: Family;Available 24 hours/day Vocation: Retired  Function:  Eating Eating                 Cognition Comprehension Comprehension assist level: Follows complex conversation/direction with extra time/assistive device  Expression   Expression assist level: Expresses complex ideas: With extra time/assistive device  Social Interaction Social Interaction assist level: Interacts appropriately with others with medication or extra time (anti-anxiety, antidepressant).  Problem Solving Problem solving assist level: Solves complex problems: With extra time  Memory Memory assist level: More than reasonable amount of time    Recommendations for other services: None   Discharge Criteria: Patient will be discharged from SLP if patient refuses treatment 3 consecutive times without medical reason, if treatment goals not met, if there  is a change in medical status, if patient makes no progress towards goals or if patient is discharged from hospital.  The above assessment, treatment plan, treatment alternatives and goals were discussed and mutually agreed upon: by patient  Emilio Math 07/24/2017, 12:21 PM

## 2017-07-24 NOTE — Progress Notes (Signed)
Subjective: Patient has no complaints.  He denies chest pain, shortness of breath.  No headache. No nausea or vomiting  Objective:  BP (!) 146/73 (BP Location: Left Arm)   Pulse 67   Temp 98 F (36.7 C) (Oral)   Resp 16   Wt 181 lb (82.1 kg)   SpO2 98%   BMI 28.35 kg/m   Elderly male in no acute distress.  He sitting up. HEENT exam atraumatic, normocephalic, neck supple. Chest clear to auscultation Cardiac exam S1-S2 regular Abdominal exam active bowel sounds, soft Extremities without edema.  Assessment and plan: 1.Right hemiparesis and functional deficits secondary toLeft subcortical infarct.  inpatient rehab. 2.  DVT prophylaxis with Eliquis 3.  Pain management: Patient denies pain 4.  Myasthenia gravis on Mestinon and prednisone Type 2 diabetes CBG (last 3)  Recent Labs    07/23/17 1736 07/23/17 2122 07/24/17 0634  GLUCAP 131* 178* 94  Will monitor for now.  Continue current medications 6.  Hypertension: Fair control for now.  We will continue to monitor 6.  Coronary artery disease patient has no symptoms.  We will continue to monitor and continue current medications.

## 2017-07-25 ENCOUNTER — Inpatient Hospital Stay (HOSPITAL_COMMUNITY): Payer: Medicare Other | Admitting: Occupational Therapy

## 2017-07-25 DIAGNOSIS — G8191 Hemiplegia, unspecified affecting right dominant side: Secondary | ICD-10-CM

## 2017-07-25 LAB — GLUCOSE, CAPILLARY
Glucose-Capillary: 95 mg/dL (ref 65–99)
Glucose-Capillary: 142 mg/dL — ABNORMAL HIGH (ref 65–99)
Glucose-Capillary: 117 mg/dL — ABNORMAL HIGH (ref 65–99)
Glucose-Capillary: 163 mg/dL — ABNORMAL HIGH (ref 65–99)

## 2017-07-25 MED ORDER — LOPERAMIDE HCL 2 MG PO CAPS
2.0000 mg | ORAL_CAPSULE | Freq: Four times a day (QID) | ORAL | Status: DC | PRN
  Administered 2017-07-25: 2 mg via ORAL
  Filled 2017-07-25: qty 1

## 2017-07-25 NOTE — Progress Notes (Signed)
Occupational Therapy Session Note  Patient Details  Name: [name] MRN: [id number] Date of Birth: 1940/08/22  Today's Date: 07/25/2017 OT Individual Time: 1300-1345 OT Individual Time Calculation (min): 45 min    Short Term Goals: Week 1:  OT Short Term Goal 1 (Week 1): Pt will ambulate into bathroom with no more than min A OT Short Term Goal 2 (Week 1): Pt will maintain standing balance within BADL task with no more than CGA OT Short Term Goal 3 (Week 1): Pt will demonstrate improved safety awareness by locking wc breaks prior to standing without verbal cues Week 2:     Skilled Therapeutic Interventions/Progress Updates:    Pt sitting in wc upon OT arrival  Wife present.  Pt agreed to therapy.  Pt reported he got a good nights rest last night and really felt good for the first time since his stroke. He did report having nausea earlier.   Ambulated with RW to gym with CGA.  Pt recalled strategies from previous sessions, ie feet apart, and heel toe walking.  Pt stood for 30 minutes in checkers game.  He demonstrated good RUE functional uses and able to anticipate moves.  He had right LE tremors about 4 times during standing, but able to control with increased weight through leg.  Pt ambulated back to room and left with all needs in reach.  Wife in room.    Therapy Documentation Precautions:  Precautions Precautions: Fall Restrictions Weight Bearing Restrictions: No   Vital Signs: Therapy Vitals Temp: 98.7 F (37.1 C) Temp Source: Oral Pulse Rate: 73 Resp: 18 BP: 128/85 Patient Position (if appropriate): Sitting Oxygen Therapy SpO2: 100 % O2 Device: Room Air Pain:  none   ADL: ADL ADL Comments: Please see functional navigator   See Function Navigator for Current Functional Status.   Therapy/Group: Individual Therapy  Lisa Roca 07/25/2017, 5:32 PM

## 2017-07-25 NOTE — Progress Notes (Signed)
Subjective: Patient has no complaints.  He was able to stand up on his own from the bed.  He was able to partially dress himself today.  He denies chest pain or shortness of breath.  Appetite is good.  Objective:  BP (!) 169/72 (BP Location: Left Arm)   Pulse 73   Temp 98.2 F (36.8 C) (Oral)   Resp 18   Wt 179 lb 7.3 oz (81.4 kg)   SpO2 97%   BMI 28.11 kg/m   No acute distress.  Chest clear to auscultation.  Cardiac exam S1-S2 regular.  Abdominal exam active bowel sounds, soft.  Extremities without edema.  Neurologic exam is alert.  Assessment and plan: 1.Right hemiparesis and functional deficits secondary toLeft subcortical infarct.  inpatient rehab. 2.  DVT prophylaxis with Eliquis 3.  Pain management: Patient denies pain 4.  Myasthenia gravis on Mestinon and prednisone Type 2 diabetes CBG (last 3)  Recent Labs    07/24/17 1644 07/24/17 2134 07/25/17 0638  GLUCAP 144* 139* 95  Fair control.  Continue current medications. 6.  Hypertension: Controlled.  Continue current medications. 6.  Coronary artery disease: He has no symptoms.  Continue current medications.

## 2017-07-26 ENCOUNTER — Inpatient Hospital Stay (HOSPITAL_COMMUNITY): Payer: Medicare Other

## 2017-07-26 ENCOUNTER — Inpatient Hospital Stay (HOSPITAL_COMMUNITY): Payer: Medicare Other | Admitting: Occupational Therapy

## 2017-07-26 ENCOUNTER — Inpatient Hospital Stay (HOSPITAL_COMMUNITY): Payer: Medicare Other | Admitting: Physical Therapy

## 2017-07-26 DIAGNOSIS — E118 Type 2 diabetes mellitus with unspecified complications: Secondary | ICD-10-CM

## 2017-07-26 DIAGNOSIS — G459 Transient cerebral ischemic attack, unspecified: Secondary | ICD-10-CM

## 2017-07-26 LAB — GLUCOSE, CAPILLARY
GLUCOSE-CAPILLARY: 149 mg/dL — AB (ref 65–99)
Glucose-Capillary: 93 mg/dL (ref 65–99)
Glucose-Capillary: 106 mg/dL — ABNORMAL HIGH (ref 65–99)
Glucose-Capillary: 120 mg/dL — ABNORMAL HIGH (ref 65–99)

## 2017-07-26 MED ORDER — DEXAMETHASONE 0.1 % OP SUSP
1.0000 [drp] | Freq: Three times a day (TID) | OPHTHALMIC | Status: AC
  Administered 2017-07-26 – 2017-07-28 (×9): 1 [drp] via OPHTHALMIC
  Filled 2017-07-26: qty 5

## 2017-07-26 MED ORDER — NAPHAZOLINE-GLYCERIN 0.012-0.2 % OP SOLN: 1.0000 [drp] | Freq: Three times a day (TID) | OPHTHALMIC | 0 refills | Status: DC

## 2017-07-26 MED ORDER — ACETAMINOPHEN 325 MG PO TABS: 325.0000 mg | ORAL_TABLET | ORAL | Status: DC | PRN

## 2017-07-26 NOTE — Progress Notes (Signed)
Occupational Therapy Session Note  Patient Details  Name: [name] MRN: [id number] Date of Birth: 09/24/1940  Today's Date: 07/26/2017 OT Individual Time: 1446-1530 OT Individual Time Calculation (min): 44 min    Short Term Goals: Week 1:  OT Short Term Goal 1 (Week 1): Pt will ambulate into bathroom with no more than min A OT Short Term Goal 2 (Week 1): Pt will maintain standing balance within BADL task with no more than CGA OT Short Term Goal 3 (Week 1): Pt will demonstrate improved safety awareness by locking wc breaks prior to standing without verbal cues  Skilled Therapeutic Interventions/Progress Updates:    Pt completed shower and dressing during session.  He ambulated around the room to the shower and the sink during session without use of an assistive device with min assist.  Occasional LOB noted with turns when attempting to place his shirt behind him after removing it in standing.  He took shower sit to stand with min guard assist.  Noted in standing with increased posterior lean as well as when standing to dry off.  He donned his undershorts and pants in standing as well with UE support of the foot of the bed and min assist for balance.  Increased difficulty noted when attempting to work on donning his socks and shoes, as he could not cross them over and reach them efficiently.  One LOB forward when reaching down to the floor to donn them.  He completed shaving and brushing his hair at the sink as well with min guard assist.  Once ADL was completed, he ambulated around the room to retrieve dirty linens and clothing without assistive device and then ambulated up to the dayroom and back. Min assist for functional mobility with pt demonstrating LOB when attempting to walk heel to toe on the green line in the hall.  Constant min assist needed for balance during this task.  Finished session with return to the room and pt left in the wheelchair with spouse present.  Safety belt and chair  alarm in place as well.    Therapy Documentation Precautions:  Precautions Precautions: Fall Restrictions Weight Bearing Restrictions: No  Pain: Pain Assessment Pain Score: 0-No pain ADL: See Function Navigator for Current Functional Status.   Therapy/Group: Individual Therapy  Jouri Threat OTR/L 07/26/2017, 3:39 PM

## 2017-07-26 NOTE — Care Management Note (Signed)
[facility] Individual Statement of Services  Patient Name:  [name]  Date:  07/26/2017  Welcome to the [facility].  Our goal is to provide you with an individualized program based on your diagnosis and situation, designed to meet your specific needs.  With this comprehensive rehabilitation program, you will be expected to participate in at least 3 hours of rehabilitation therapies Monday-Friday, with modified therapy programming on the weekends.  Your rehabilitation program will include the following services:  Physical Therapy (PT), Occupational Therapy (OT), Speech Therapy (ST), 24 hour per day rehabilitation nursing, Case Management (Social Worker), Rehabilitation Medicine, Nutrition Services and Pharmacy Services  Weekly team conferences will be held on Wednesday to discuss your progress.  Your Social Worker will talk with you frequently to get your input and to update you on team discussions.  Team conferences with you and your family in attendance may also be held.  Expected length of stay: 10-14 days Overall anticipated outcome: supervision level  Depending on your progress and recovery, your program may change. Your Social Worker will coordinate services and will keep you informed of any changes. Your Social Worker's name and contact numbers are listed  below.  The following services may also be recommended but are not provided by the [facility] will be made to provide these services after discharge if needed.  Arrangements include referral to agencies that provide these services.  Your insurance has been verified to be:  [place] [employer] Your primary doctor is:  Renato Shin  Pertinent information will be shared with your doctor and your insurance company.  Social Worker:  Ovidio Kin, [place] or [phone]  Information discussed with and copy given to patient by: Elease Hashimoto, 07/26/2017, 1:49 PM

## 2017-07-26 NOTE — Progress Notes (Signed)
Occupational Therapy Session Note  Patient Details  Name: [name] MRN: [id number] Date of Birth: Apr 13, 1940  Today's Date: 07/26/2017 OT Individual Time: 1330-1400 OT Individual Time Calculation (min): 30 min   Short Term Goals: Week 1:  OT Short Term Goal 1 (Week 1): Pt will ambulate into bathroom with no more than min A OT Short Term Goal 2 (Week 1): Pt will maintain standing balance within BADL task with no more than CGA OT Short Term Goal 3 (Week 1): Pt will demonstrate improved safety awareness by locking wc breaks prior to standing without verbal cues  Skilled Therapeutic Interventions/Progress Updates:    Pt greeted seated in wc and agreeable to OT treatment session. Pt ambulated to therapy gym with RW and CGA with intermittent cues for proximity to RW. Addressed dynamic standing balance using foam platform and reaching outside base of support and across body to match cards. Progressed to sit<>stand in between matching each card with CGA and intermittent min A to correct LOB. Pt ambulated back to room in similar fashion and left seated in wc with safety belt, chair alarm, and needs met.   Therapy Documentation Precautions:  Precautions Precautions: Fall Restrictions Weight Bearing Restrictions: No Pain:  none/denies pain ADL: ADL ADL Comments: Please see functional navigator  See Function Navigator for Current Functional Status.  Therapy/Group: Individual Therapy  Valma Cava 07/26/2017, 2:01 PM

## 2017-07-26 NOTE — Progress Notes (Signed)
Social Work Social Work Assessment and Plan  Patient Details  Name: [name] MRN: [id number] Date of Birth: 24-Oct-1940  Today's Date: 07/26/2017  Problem List:  Patient Active Problem List   Diagnosis Date Noted  . Transient ischemic attack (TIA) 07/23/2017  . Subcortical infarction ([place])   . Right hemiparesis ([place])   . Ataxia 07/21/2017  . TIA (transient ischemic attack)   . Thrombocytopenia ([place])   . Wheezing 06/17/2017  . Bursitis of left elbow 05/01/2017  . Dystonia 12/29/2016  . PAD (peripheral artery disease) ([place]) 11/18/2016  . Osteoporosis 08/28/2016  . Paronychia of great toe, left 08/10/2016  . Carotid arterial disease ([place]) 07/09/2016  . Hypocalcemia 07/09/2016  . Diplopia 07/02/2016  . S/P lumbar laminectomy 03/11/2016  . Radicular pain of left lower extremity 02/21/2016  . Cough 01/10/2016  . 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 Myasthenia gravis ([place])   . Diabetes mellitus type 2 in nonobese (HCC)   . Coronary artery disease involving native coronary artery of native heart without angina pectoris   . Acute blood loss anemia   . Ataxia, post-stroke   . Gait disturbance, post-stroke   . Proximal leg weakness   . Cervical spondylosis without myelopathy   . Achilles tendinitis of right lower extremity   . Chronic diastolic CHF (congestive heart failure) ([place])   . Gastroesophageal reflux disease without esophagitis   . Chronic kidney disease   . 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 Cerebellar stroke ([place])   . Ischemic stroke ([place])   . Cerebrovascular accident (CVA) due to thrombosis of left vertebral artery ([place])   . History of pulmonary embolus (PE)   . Chronic anticoagulation   . HLD (hyperlipidemia)   . Neck pain 03/19/2015  . Pain in the chest   . 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 Chronic diastolic heart failure, NYHA class 1 ([place]) 03/07/2015  .  Diabetic gastroparesis ([place]) 03/07/2015  . Screening for osteoporosis 07/19/2014  . Edema 04/09/2014  . Routine general medical examination at a health care facility 10/23/2013  . Bilateral carotid bruits 05/24/2013  . Abnormal CT scan, lung 05/24/2013  . Encounter for therapeutic drug monitoring 03/14/2013  . Lymphadenopathy, inguinal 01/11/2013  . AKI (acute kidney injury) ([place]) 10/28/2012  . 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 Myasthenia gravis with exacerbation ([place]) 03/31/2012  . Disease of salivary gland 12/09/2011  . Left facial pain 10/28/2011  . Pulmonary embolism ([place]) 10/25/2011  . 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 GERD with stricture 09/23/2011  . Long term (current) use of anticoagulants 08/03/2011  . History of pulmonary embolism 07/30/2011  . History of DVT (deep vein thrombosis) 07/30/2011  . Subconjunctival hemorrhage 06/22/2011  . Bilateral conjunctivitis 04/09/2011  . Diabetes mellitus type 2, controlled ([place]) 12/18/2010  . Hearing loss 08/04/2010  . Ptosis of eyelid 12/30/2009  . Essential hypertension 04/24/2009  . HYPERLIPIDEMIA, MIXED 10/05/2008  . HEMORRHOIDS-INTERNAL 07/02/2008  . HEMORRHOIDS-EXTERNAL 07/02/2008  . DIVERTICULOSIS-COLON 07/02/2008  . PERSONAL HX COLONIC POLYPS 07/02/2008  . RASH-NONVESICULAR 02/07/2008  . PNEUMONIA 12/08/2007  . 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 CAD (coronary artery disease) 07/07/2007  . NEPHROLITHIASIS, HX OF 07/07/2007   Past Medical History:  Past Medical History:  Diagnosis Date  . Bruises easily    d/t being on Eliquis  . Carotid artery disease ([place])    a. Duplex 05/2014: 40-59% RICA, 1-39%  LICA, elevated velocities in right subclavian artery, normal left subclavian artery..  . Chronic back pain    HNP  . Chronic kidney disease (CKD)   . Claustrophobia    takes Ativan as needed  . 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 Coronary atherosclerosis  of unspecified type of vessel, native or graft    a. cath in 2003 showed 10-20% LM, scattered 20% prox-mid LAD, 30% more distal LAD, 50-60% stenosis of LAD towards apex, 20% Cx, 30% dRCA, focal 95% stenosis in smaller of 2 branches of PDA, EF 60-65%.  . Diabetes mellitus ([place])   . Diarrhea    takes Imodium daily as needed  . Diverticulosis   . Diverticulosis of colon (without mention of hemorrhage)   . DJD (degenerative joint disease)   . DVT (deep venous thrombosis) ([place]) 07/2011  . Esophageal stricture   . Essential hypertension    takes Imdur and Lisinopril daily  . Gastric polyps    benign  . GERD (gastroesophageal reflux disease)    takes Nexium daily  . Glaucoma    uses eye drops  . History of bronchitis    not sure when the last time  . History of colon polyps    benign  . History of kidney stones   . History of kidney stones   . Insomnia    takes Melatonin nightly as needed  . Mixed hyperlipidemia    takes Crestor daily  . Myasthenia gravis ([place])    uses prednisone  . Osteoporosis 2016  . Peripheral edema    takes Lasix daily  . Pituitary tumor    [facility] Dr[facility]Walden at [facility] checks it every Jan   . Pneumonia 01/2016  . PONV (postoperative nausea and vomiting)   . Ptosis   . Pulmonary embolus ([place]) 2012  . Stroke [facility]) 06/2015   Past Surgical History:  Past Surgical History:  Procedure Laterality Date  . ABDOMINAL AORTOGRAM W/LOWER EXTREMITY N/A 11/18/2016   Procedure: ABDOMINAL AORTOGRAM W/LOWER EXTREMITY;  Surgeon: [name], MD;  Location: [facility];  Service: Cardiovascular;  Laterality: N/A;  . BACK SURGERY     x2, lumbar and cervical  . CARDIAC CATHETERIZATION    . CATARACT EXTRACTION Bilateral 12/12  . COLONOSCOPY    . ESOPHAGOGASTRODUODENOSCOPY ENDOSCOPY  04/2015  . IR GENERIC HISTORICAL  03/03/2016   IR IVC FILTER PLMT / S&I [name] GUID/MOD SED 03/03/2016 [name], MD [facility]  . IR GENERIC HISTORICAL  04/09/2016   IR  RADIOLOGIST EVAL & MGMT 04/09/2016 [name], MD [facility]  . IR GENERIC HISTORICAL  04/30/2016   IR IVC FILTER RETRIEVAL / S&I [name] GUID/MOD SED 04/30/2016 [name], MD [facility]INTER[facility] RAD  . LITHOTRIPSY    . LUMBAR LAMINECTOMY/DECOMPRESSION MICRODISCECTOMY Right 09/14/2012   Procedure: Right Lumbar three-four, four-five, Lumbar five-Sacral one decompressive laminectomy;  Surgeon: [name], MD;  Location: [facility];  Service: Neurosurgery;  Laterality: Right;  . LUMBAR LAMINECTOMY/DECOMPRESSION MICRODISCECTOMY Left 03/11/2016   Procedure: Left Lumbar Two-ThreeMicrodiscectomy;  Surgeon: [name], MD;  Location: [facility];  Service: Neurosurgery;  Laterality: Left;  . PERIPHERAL VASCULAR INTERVENTION  11/18/2016   Procedure: PERIPHERAL VASCULAR INTERVENTION;  Surgeon: [name], MD;  Location: [facility];  Service: Cardiovascular;;  Left popliteal  . SHOULDER SURGERY Left 05/07/2009  . TONSILLECTOMY     Social History:  reports that he has never smoked. He has never used smokeless  tobacco. He reports that he does not drink alcohol or use drugs.  Family / Support Systems Marital Status: Married Patient Roles: Spouse, Parent Spouse/Significant Other: Hassan Rowan [phone]  [phone] Children: Grown children supportive Other Supports: Church members and friends Anticipated Caregiver: Wife Ability/Limitations of Caregiver: Wife is in good health and retired 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 Family Dynamics: Both have family and friend support, pt has been through this in 2017 and knows the routine. He is doing well and hopeful he will be mod/i by discharge. Wife very supportive and can assist at discharge  Social History Preferred language: English Religion: Baptist Cultural Background: No issues Education: [employer] Read: Yes Write: Yes Employment Status: Retired Freight forwarder Issues: No issues Guardian/Conservator: None-according to MD pt is  capable of making his own decisions while here   Abuse/Neglect Abuse/Neglect Assessment Can Be Completed: Yes Physical Abuse: Denies Verbal Abuse: Denies Sexual Abuse: Denies Exploitation of patient/patient's resources: Denies Self-Neglect: Denies  Emotional Status Pt's affect, behavior adn adjustment status: Pt is motivated and can see progress already, he is enocuraged by this. He was here in 2017 and did well after his stroke, he is hopeful he will do so again. His wife is supportive and can assist if needed. Recent Psychosocial Issues: other health issues Pyschiatric History: No history deferred depression screen due to coping appropriately and verbalizing his feelings and concerns. Will monitor while here and see if needs neuro-psych while here Substance Abuse History: No issues  Patient / Family Perceptions, Expectations & Goals Pt/Family understanding of illness & functional limitations: Pt and wife can explain his TIA, his balance is the main issue and he is seeing improvement with this. They both do talk with the MD and feel their questions and concerns are being addressed. Premorbid pt/family roles/activities: husband, father, retiree, church member, friend, etc Anticipated changes in roles/activities/participation: resume Pt/family expectations/goals: Pt states: " I hope to do well and be almost independent by the time I leave here."  Wife states: " He is doing well and I am hopeful of his improvement here."  [employer]: Other (Comment)(had in past) Premorbid Home Care/DME Agencies: Other (Comment)(had in past) Transportation available at discharge: Wife Resource referrals recommended: Support group (specify)  Discharge Planning Living Arrangements: Spouse/significant other Support Systems: Spouse/significant other, Children, Friends/neighbors, Social worker community Type of Residence: Private residence Insurance Resources: [employer], English as a second language teacher (specify)([employer]) Financial Resources: Radio broadcast assistant Screen Referred: No Living Expenses: Own Money Management: Spouse, Patient Does the patient have any problems obtaining your medications?: No Home Management: Wife, pt was doing outside work  Magazine features editor Plans: Return home with wife who is able to provide assistance if needed. They have been through this before in 2017 and he was on the CIR and did well. He is hoping he will do just as well and glad his stroke was not worse than it was. Social Work Anticipated Follow Up Needs: HH/OP, Support Group  Clinical Impression Pleasant gentleman who is motivated to do well and aware of CIR since he was here in 2017. His wife is very supportive and can assist if needed. Pt's main issue is his balance and he is making progress with this now. Will work on discharge needs.  Elease Hashimoto 07/26/2017, 2:04 PM

## 2017-07-26 NOTE — IPOC Note (Signed)
Overall Plan of Care [facility]) Patient Details Name: [name] MRN: [id number] DOB: 1940/10/26  Admitting Diagnosis: <principal problem not specified>subcortical infarct  Hospital Problems: Active Problems:   Transient ischemic attack (TIA)   Subcortical infarction ([place])   Right hemiparesis ([place])     Functional Problem List: Nursing Endurance, Motor, Pain, Safety  PT Balance, Endurance, Motor, Safety  OT Balance, Cognition, Endurance, Motor, Safety  SLP    TR         Basic ADL's: OT Grooming, Dressing, Bathing, Toileting     Advanced  ADL's: OT       Transfers: PT Bed Mobility, Bed to Chair, Car, Furniture, Futures trader, Metallurgist: PT Ambulation, Stairs     Additional Impairments: OT Fuctional Use of Upper Extremity  SLP        TR      Anticipated Outcomes Item Anticipated Outcome  Self Feeding    Swallowing      Basic self-care  Supervision  Toileting  Supervision   Bathroom Transfers Supervision  Bowel/Bladder  to be continent x 2  Transfers  Supervision with LRAD  Locomotion  Supervision with LRAD  Communication     Cognition     Pain  less than 2  Safety/Judgment  will remain fall free   Therapy Plan: PT Intensity: Minimum of 1-2 x/day ,45 to 90 minutes PT Frequency: 5 out of 7 days PT Duration Estimated Length of Stay: 10-14 days OT Intensity: Minimum of 1-2 x/day, 45 to 90 minutes OT Frequency: 5 out of 7 days OT Duration/Estimated Length of Stay: 10-14 days      Team Interventions: Nursing Interventions Patient/Family Education, Pain Management, Cognitive Remediation/Compensation, Discharge Planning  PT interventions Ambulation/gait training, Training and development officer, Community reintegration, Discharge planning, Disease management/prevention, DME/adaptive equipment instruction, Functional mobility training, Neuromuscular re-education, Pain management, Patient/family education, Psychosocial support,  Splinting/orthotics, Stair training, Therapeutic Activities, Therapeutic Exercise, UE/LE Strength taining/ROM, UE/LE Coordination activities  OT Interventions Training and development officer, Cognitive remediation/compensation, Community reintegration, Discharge planning, UE/LE Coordination activities, UE/LE Strength taining/ROM, Therapeutic Exercise, Therapeutic Activities, Self Care/advanced ADL retraining, Patient/family education, Neuromuscular re-education, Functional mobility training, DME/adaptive equipment instruction  SLP Interventions    TR Interventions    SW/CM Interventions Discharge Planning, Psychosocial Support, Patient/Family Education   Barriers to Discharge MD  Medical stability  Nursing Lack of/limited family support    PT Home environment access/layout    OT      SLP      SW       Team Discharge Planning: Destination: PT-Home ,OT- Home , SLP-  Projected Follow-up: PT-Home health PT, OT-  Home health OT, SLP-None Projected Equipment Needs: PT-To be determined, OT- To be determined, SLP-None recommended by SLP Equipment Details: PT-RW vs no AD, OT-  Patient/family involved in discharge planning: PT- Patient, Family member/caregiver,  OT-Patient, SLP-Patient  MD ELOS: 10-14 days Medical Rehab Prognosis:  Excellent Assessment: The patient has been admitted for CIR therapies with the diagnosis of subcortical infarct, hx of MG. The team will be addressing functional mobility, strength, stamina, balance, safety, adaptive techniques and equipment, self-care, bowel and bladder mgt, patient and caregiver education, NMR, community reentry, ego support. Goals have been set at supervision for self-care and mobility.    Meredith Staggers, MD, FAAPMR      See Team Conference Notes for weekly updates to the plan of care

## 2017-07-26 NOTE — Progress Notes (Signed)
Physical Therapy Session Note  Patient Details  Name: [name] MRN: [id number] Date of Birth: 1940/05/03  Today's Date: 07/26/2017 PT Individual Time: 1105-1205 PT Individual Time Calculation (min): 60 min   Short Term Goals: Week 1:  PT Short Term Goal 1 (Week 1): Pt will ambulate x 100 ft with LRAD and min A consistently PT Short Term Goal 2 (Week 1): Pt will ascend/descend 4 stairs with one handrail and min A PT Short Term Goal 3 (Week 1): Pt will perform bed to/from chair transfers safely and with SBA  Skilled Therapeutic Interventions/Progress Updates: Pt presented in w/c agreeable to therapy. Pt performed sit to stand from w/c with RW and supervision. Pt ambulated to rehab gym with RW with min guard and w/c follow. Pt noted to have improved BOS and bilateral foot clearance. Pt participated in balance activities including horseshoes with x 1 occurrence of posterior LOB requiring minA from PTA for recovery. Performed static balance on Airex feet together/feet apart with initial no ankle strategy. Performed horseshoes on Airex with some ankle strategy noted. Performed ball toss no AD with feet together, then tandem stance x 2 min ea. Performed gait activities including obstacle course weaving through cones, stepping over threshold, and stepping onto Airex. Pt required min verbal cues for safety with RW and safety around obstacles. Trialed gait training no AD with minA, cues for increasing BOS and intermittent L foot clearance. Pt returned to room with use of RW, provided verbal cues for L foot clearance. Pt remained in w/c at end of session with call bell within reach and needs met.      Therapy Documentation Precautions:  Precautions Precautions: Fall Restrictions Weight Bearing Restrictions: No General:   Vital Signs: Therapy Vitals Temp: 99 F (37.2 C) Temp Source: Oral Pulse Rate: 81 Resp: 19 BP: (!) 136/59 Patient Position (if appropriate): Sitting Oxygen Therapy SpO2:  100 % O2 Device: Room Air Pain: Pain Assessment Pain Score: 0-No pain  See Function Navigator for Current Functional Status.   Therapy/Group: Individual Therapy  [name]  [name], PTA  07/26/2017, 4:11 PM

## 2017-07-26 NOTE — Progress Notes (Addendum)
Subjective/Complaints: Eye redness for ~4wks saw optho as outpt who started antiobiotic eye drops  ROS neg CP, SOB, NVD, freq stools , no abd pain Objective: Vital Signs: Blood pressure (!) 143/62, pulse 73, temperature 98.5 F (36.9 C), temperature source Oral, resp. rate 16, weight 79.6 kg (175 lb 7.8 oz), SpO2 98 %. No results found. Results for orders placed or performed during the hospital encounter of 07/23/17 (from the past 72 hour(s))  Glucose, capillary     Status: Abnormal   Collection Time: 07/23/17  5:36 PM  Result Value Ref Range   Glucose-Capillary 131 (H) 65 - 99 mg/dL  Glucose, capillary     Status: Abnormal   Collection Time: 07/23/17  9:22 PM  Result Value Ref Range   Glucose-Capillary 178 (H) 65 - 99 mg/dL   Comment 1 Notify RN   Glucose, capillary     Status: None   Collection Time: 07/24/17  6:34 AM  Result Value Ref Range   Glucose-Capillary 94 65 - 99 mg/dL  CBC WITH DIFFERENTIAL     Status: Abnormal   Collection Time: 07/24/17  7:39 AM  Result Value Ref Range   WBC 8.3 4.0 - 10.5 K/uL   RBC 3.93 (L) 4.22 - 5.81 MIL/uL   Hemoglobin 12.6 (L) 13.0 - 17.0 g/dL   HCT 38.2 (L) 39.0 - 52.0 %   MCV 97.2 78.0 - 100.0 fL   MCH 32.1 26.0 - 34.0 pg   MCHC 33.0 30.0 - 36.0 g/dL   RDW 14.3 11.5 - 15.5 %   Platelets 170 150 - 400 K/uL   Neutrophils Relative % 64 %   Neutro Abs 5.4 1.7 - 7.7 K/uL   Lymphocytes Relative 22 %   Lymphs Abs 1.8 0.7 - 4.0 K/uL   Monocytes Relative 11 %   Monocytes Absolute 0.9 0.1 - 1.0 K/uL   Eosinophils Relative 1 %   Eosinophils Absolute 0.1 0.0 - 0.7 K/uL   Basophils Relative 1 %   Basophils Absolute 0.0 0.0 - 0.1 K/uL   Immature Granulocytes 1 %   Abs Immature Granulocytes 0.0 0.0 - 0.1 K/uL    Comment: Performed at [facility], [address], [place], [place] [address]  Comprehensive metabolic panel     Status: Abnormal   Collection Time: 07/24/17  7:39 AM  Result Value Ref Range   Sodium 142 135 - 145 mmol/L    Potassium 4.0 3.5 - 5.1 mmol/L    Comment: NO VISIBLE HEMOLYSIS   Chloride 109 101 - 111 mmol/L   CO2 29 22 - 32 mmol/L   Glucose, Bld 102 (H) 65 - 99 mg/dL   BUN 10 6 - 20 mg/dL   Creatinine, Ser 1.45 (H) 0.61 - 1.24 mg/dL   Calcium 8.9 8.9 - 10.3 mg/dL   Total Protein 5.6 (L) 6.5 - 8.1 g/dL   Albumin 3.6 3.5 - 5.0 g/dL   AST 37 15 - 41 U/L   ALT 32 17 - 63 U/L   Alkaline Phosphatase 45 38 - 126 U/L   Total Bilirubin 0.8 0.3 - 1.2 mg/dL   GFR calc non Af Amer 44 (L) >60 mL/min   GFR calc Af Amer 51 (L) >60 mL/min    Comment: (NOTE) The eGFR has been calculated using the CKD EPI equation. This calculation has not been validated in all clinical situations. eGFR's persistently <60 mL/min signify possible Chronic Kidney Disease.    Anion gap 4 (L) 5 - 15  Comment: Performed at [facility], [place] [address] [place], [name] [address]  Glucose, capillary     Status: Abnormal   Collection Time: 07/24/17 11:35 AM  Result Value Ref Range   Glucose-Capillary 106 (H) 65 - 99 mg/dL  Glucose, capillary     Status: Abnormal   Collection Time: 07/24/17  4:44 PM  Result Value Ref Range   Glucose-Capillary 144 (H) 65 - 99 mg/dL  Glucose, capillary     Status: Abnormal   Collection Time: 07/24/17  9:34 PM  Result Value Ref Range   Glucose-Capillary 139 (H) 65 - 99 mg/dL   Comment 1 Notify RN   Glucose, capillary     Status: None   Collection Time: 07/25/17  6:38 AM  Result Value Ref Range   Glucose-Capillary 95 65 - 99 mg/dL  Glucose, capillary     Status: Abnormal   Collection Time: 07/25/17 11:37 AM  Result Value Ref Range   Glucose-Capillary 142 (H) 65 - 99 mg/dL  Glucose, capillary     Status: Abnormal   Collection Time: 07/25/17  4:44 PM  Result Value Ref Range   Glucose-Capillary 117 (H) 65 - 99 mg/dL  Glucose, capillary     Status: Abnormal   Collection Time: 07/25/17  9:13 PM  Result Value Ref Range   Glucose-Capillary 163 (H) 65 - 99 mg/dL  Glucose, capillary      Status: None   Collection Time: 07/26/17  6:44 AM  Result Value Ref Range   Glucose-Capillary 93 65 - 99 mg/dL     HEENT: Eyes injected Cardio: RRR and no murmur Resp: CTA B/L and Unlabored GI: BS positive and NT, ND Extremity:  Pulses positive and No Edema Skin:   Intact Neuro: Alert/Oriented, Normal Sensory and Abnormal Motor 4+ RUE adn RLE, 5/5 left side UE adn LE Musc/Skel:  Normal and Other no pain with UE or LE AROM Gen NAD   Assessment/Plan: 1. Functional deficits secondary to Right hemiparesis left subcortical infarct which require 3+ hours per day of interdisciplinary therapy in a comprehensive inpatient rehab setting. Physiatrist is providing close team supervision and 24 hour management of active medical problems listed below. Physiatrist and rehab team continue to assess barriers to discharge/monitor patient progress toward functional and medical goals. FIM: Function - Bathing Position: Shower Body parts bathed by patient: Right arm, Right lower leg, Left arm, Left lower leg, Abdomen, Front perineal area, Buttocks, Right upper leg, Left upper leg, Chest Assist Level: Touching or steadying assistance(Pt > 75%)  Function- Upper Body Dressing/Undressing What is the patient wearing?: Pull over shirt/dress Pull over shirt/dress - Perfomed by patient: Thread/unthread right sleeve, Thread/unthread left sleeve, Put head through opening, Pull shirt over trunk Assist Level: Supervision or verbal cues Function - Lower Body Dressing/Undressing What is the patient wearing?: Pants, Non-skid slipper socks Position: Sitting EOB Underwear - Performed by patient: Thread/unthread right underwear leg, Thread/unthread left underwear leg, Pull underwear up/down Pants- Performed by patient: Thread/unthread right pants leg, Thread/unthread left pants leg Socks - Performed by patient: Don/doff right sock, Don/doff left sock Shoes - Performed by patient: Don/doff left shoe, Don/doff right  shoe Shoes - Performed by helper: Fasten right, Fasten left Assist for footwear: Supervision/touching assist Assist for lower body dressing: Touching or steadying assistance (Pt > 75%)  Function - Toileting Toileting steps completed by patient: Adjust clothing prior to toileting, Performs perineal hygiene, Adjust clothing after toileting Toileting steps completed by helper: Adjust clothing prior to toileting, Adjust clothing  after toileting Toileting Assistive Devices: Grab bar or rail Assist level: Touching or steadying assistance (Pt.75%)  Function - Toilet Transfers Toilet transfer assistive device: Grab bar Assist level to toilet: Touching or steadying assistance (Pt > 75%) Assist level from toilet: Touching or steadying assistance (Pt > 75%)  Function - Chair/bed transfer Chair/bed transfer method: Stand pivot Chair/bed transfer assist level: Touching or steadying assistance (Pt > 75%) Chair/bed transfer details: Verbal cues for sequencing, Verbal cues for technique, Verbal cues for precautions/safety  Function - Locomotion: Wheelchair Will patient use wheelchair at discharge?: No Function - Locomotion: Ambulation Assistive device: Walker-rolling Max distance: 125 Assist level: Touching or steadying assistance (Pt > 75%) Assist level: Touching or steadying assistance (Pt > 75%) Assist level: Touching or steadying assistance (Pt > 75%) Walk 150 feet activity did not occur: Safety/medical concerns Walk 10 feet on uneven surfaces activity did not occur: Safety/medical concerns  Function - Comprehension Comprehension: Auditory Comprehension assistive device: Hearing aids Comprehension assist level: Follows basic conversation/direction with no assist  Function - Expression Expression: Verbal Expression assist level: Expresses basic needs/ideas: With no assist  Function - Social Interaction Social Interaction assist level: Interacts appropriately 90% of the time - Needs  monitoring or encouragement for participation or interaction.  Function - Problem Solving Problem solving assist level: Solves basic problems with no assist  Function - Memory Memory assist level: Recognizes or recalls 90% of the time/requires cueing < 10% of the time Patient normally able to recall (first 3 days only): Current season, Location of own room, Staff names and faces, That he or she is in a hospital  Medical Problem List and Plan:  1. Right hemiparesis and functional deficits secondary to ?left subcortical infarct, hx of myasthenia gravis.  -CIR PT, OT, SLP 2. DVT Prophylaxis/Anticoagulation: Pharmaceutical: Other (comment)--Eliquis  3. Pain Management: tylenol prn.  4. Mood: LCSW to follow for evaluation and support.  5. Neuropsych: This patient is capable of making decisions on his own behalf.  6. Skin/Wound Care: Routine pressure relief measures.  7. Fluids/Electrolytes/Nutrition: Monitor I/O. Check lytes in am.  8. MG: On Mestinon and prednisone.  9. 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 GERD: Resume Zantac bid.  12. HTN: Monitor BP bid--continue Imdur.  Vitals:   07/25/17 1946 07/26/17 0450  BP: (!) 146/82 (!) 143/62  Pulse: 83 73  Resp: 18 16  Temp: 98.2 F (36.8 C) 98.5 F (36.9 C)  SpO2: 100% 98%  Fair control 6/17 13. CAD : Monitor for symptoms with activity. On Imdur and Crestor.  14. Glaucoma: Managed with Timolol and Alphagan bid.  -on tobrex eye gtts recently for recent infection  -steroid gtts for inflammation (already receiving prednisone PO but low dose)   15.  CKD 3  LOS (Days) 3 A FACE TO FACE EVALUATION WAS PERFORMED  Charlett Blake 07/26/2017, 6:59 AM

## 2017-07-26 NOTE — Progress Notes (Addendum)
Physical Therapy Session Note  Patient Details  Name: [name] MRN: [id number] Date of Birth: 07-10-1940  Today's Date: 07/26/2017 tx 1:  PT Individual Time: 1005-1035 PT Individual Time Calculation (min): 30 min   Short Term Goals: Week 1:  PT Short Term Goal 1 (Week 1): Pt will ambulate x 100 ft with LRAD and min A consistently PT Short Term Goal 2 (Week 1): Pt will ascend/descend 4 stairs with one handrail and min A PT Short Term Goal 3 (Week 1): Pt will perform bed to/from chair transfers safely and with SBA  Skilled Therapeutic Interventions/Progress Updates:   tx 1:  Pain: none per pt Pain Assessment Pain Scale: 0-10 Pain Score: 0-No pain   Pt asked to brush teeth and wipe face off.  Seated in w/c, pt performed oral care, cleaned eyes with baby shampoo and wiped off face.  He doffed socks, donned other socks with supervision; min assist to don shoes.  neuromuscular re-education via forced use, demo and multimodal cues for self stretching L hamstrings in sitting, using footstool, x 30 seconds x 3.   Balance retraining in standing: given external perturbations, pt initially demonstrated no balance strategies at all; with continued manual cues, pt eventually demonstrated bil ankle strategies, and limited hip strategies, no stepping strategies.    Pt left resting in w/c with quick release belt applied, alarm set and all needs within reach.     Therapy Documentation Precautions:  Precautions Precautions: Fall Restrictions Weight Bearing Restrictions: No  tx 2:   1300-1330, 30 min individual tx  No pain, per pt  W/c propulsion using bil UEs for activity tolerance, x 100' with supervision and cues for efficiency and function.  Stand pivot w/c> mat to L with close supervision, cues to slow down for safety.  neuromuscular re-education via multimodal cues and demo for balance challenge and sustained stretch bil hamstrings and heel cords in standing on wedge, x 1  minute with bil UE support, x 1 minute without UE support. Pt improved in hip stategy bil with manual cues.  Gait training on level tile to return to room, using Rw, kicking Yoga block with L foot to facilitate L hip and knee flexion, and L knee extension speed and power.  As he fatigued, L toe caught floor requiring min assist for balance.   Pt left resting in w/c with quick release belt applied, alarm set and all needs within reach.   See Function Navigator for Current Functional Status.   Therapy/Group: Individual Therapy  Josy Peaden 07/26/2017, 10:41 AM

## 2017-07-26 NOTE — Plan of Care (Signed)
  Problem: Consults Goal: RH STROKE PATIENT EDUCATION Description See Patient Education module for education specifics  Outcome: Progressing   Problem: RH SKIN INTEGRITY Goal: RH STG SKIN FREE OF INFECTION/BREAKDOWN Description Patients skin will remain free from further infection or breakdown with min assist.   Outcome: Progressing Goal: RH STG MAINTAIN SKIN INTEGRITY WITH ASSISTANCE Description STG Maintain Skin Integrity With min Assistance.  Outcome: Progressing Flowsheets (Taken 07/26/2017 1243) STG: Maintain skin integrity with assistance: 4-Minimal assistance   Problem: RH SAFETY Goal: RH STG ADHERE TO SAFETY PRECAUTIONS W/ASSISTANCE/DEVICE Description STG Adhere to Safety Precautions With min Assistance/Device.  Outcome: Progressing Flowsheets (Taken 07/26/2017 1243) STG:Pt will adhere to safety precautions with assistance/device: 4-Minimal assistance   Problem: RH COGNITION-NURSING Goal: RH STG USES MEMORY AIDS/STRATEGIES W/ASSIST TO PROBLEM SOLVE Description STG Uses Memory Aids/Strategies With min Assistance to Problem Solve.  Outcome: Progressing Flowsheets (Taken 07/26/2017 1243) STG: Uses memory aids/strategies with assistance: 5-Supervision/set up   Problem: RH KNOWLEDGE DEFICIT Goal: RH STG INCREASE KNOWLEDGE OF HYPERTENSION Description With min assist.  Outcome: Progressing

## 2017-07-26 NOTE — Progress Notes (Signed)
Patient information reviewed and entered into eRehab system by Tacari Repass, RN, CRRN, PPS Coordinator.  Information including medical coding and functional independence measure will be reviewed and updated through discharge.     Per nursing patient was given "Data Collection Information Summary for Patients in Inpatient Rehabilitation Facilities with attached "Privacy Act Statement-Health Care Records" upon admission.  

## 2017-07-27 ENCOUNTER — Inpatient Hospital Stay (HOSPITAL_COMMUNITY): Payer: Medicare Other | Admitting: Occupational Therapy

## 2017-07-27 ENCOUNTER — Inpatient Hospital Stay (HOSPITAL_COMMUNITY): Payer: Medicare Other | Admitting: Physical Therapy

## 2017-07-27 LAB — GLUCOSE, CAPILLARY
GLUCOSE-CAPILLARY: 98 mg/dL (ref 65–99)
GLUCOSE-CAPILLARY: 111 mg/dL — AB (ref 65–99)
GLUCOSE-CAPILLARY: 200 mg/dL — AB (ref 65–99)
Glucose-Capillary: 153 mg/dL — ABNORMAL HIGH (ref 65–99)

## 2017-07-27 NOTE — Discharge Summary (Signed)
Physician Discharge Summary  Patient ID: [name] MRN: [id number] DOB/AGE: Oct 20, 1940 77 y.o.  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8(L) 8.9 8.6(L)    CBC: CBC Latest Ref Rng & Units 07/24/2017 07/21/2017 07/20/2017  WBC 4.0 - 10.5 K/uL 8.3 6.2 7.2  Hemoglobin 13.0 - 17.0 g/dL 12.6(L) 10.7(L) 11.2(L)  Hematocrit 39.0 - 52.0 % 38.2(L) 32.9(L) 34.1(L)  Platelets 150 - 400 K/uL 170 136(L) 139(L)    CBG: Recent Labs  Lab 07/29/17 0644 07/29/17 1121 07/29/17 1635 07/29/17 2112 07/30/17 0646  GLUCAP 81 135* 145* 119* 96    Brief HPI:   [name] is a 77 year old male with history of type II DM, MG, PE, CKD, cerebellar stroke who was admitted 07/19/2017 with sudden onset of nausea vomiting and difficulty walking.  CT of head was negative for bleed but TPA not given as patient had taken Eliquis that a.m.  CT of head was negative for emergent large vessel occlusion and MRI brain was negative for acute intracranial process.  Dr. 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  Neurology recommended continuing Eliquis avoiding dehydration as well as hypotension.  Patient was noted to be limited by bilateral lower extremity weakness with ataxia and instability.  Therapy evaluation showed decline in functional status and CIR was recommended for follow-up therapy.   Hospital Course: [name] was admitted to rehab 07/23/2017 for inpatient therapies to consist of PT  and OT at least three hours five days a week. Past admission physiatrist, therapy team and rehab RN have worked together to provide customized collaborative inpatient rehab.  Zantac was resumed on twice daily basis to help with GERD symptoms.  Blood pressures were monitored on twice daily basis and have been reasonably controlled on Imdur.  P.o. intake has been good and he is continent of bowel and bladder.  He continues to have problems with irritation and itching of bilateral eyes with poor response to TobraDex.  Therefore he was treated with steroid drops and is to follow-up with ophthalmology for further input.  Follow-up labs done showed chronic kidney disease is at baseline.  He continues on Eliquis twice daily and H&H is relatively stable.  Thrombocytopenia has resolved.  He has made good progress during his rehab stay and is at supervision level at discharge.  He will continue to receive follow-up outpatient PT at [facility] after discharge.   Rehab course: During patient's stay in rehab team conference was held to monitor patient's progress,  set goals and discuss barriers to discharge. At admission, patient required min assist with basic self-care tasks and mobility.  Cognitive linguistic evaluation showed mild deficits which were at baseline and no further follow-up needed during the stay.  He  has had improvement in activity tolerance, balance, postural control as well as ability to compensate for deficits.  He is able to complete ADL task at modified independent level.  Balance is  improved and no loss of balance noted with challenges.  He is modified independent for transfers and is able to ambulate 300 feet at modified independent level without an assistive device.  He continues to have poor foot clearance bilaterally.  He requires supervision to climb 12 stairs.    Discharge disposition: 01-Home or Self Care  Diet: Heart Healthy/Diabetic diet.   Special Instructions: 1. No driving till cleared by MD.   Discharge Instructions    Ambulatory referral to Physical Medicine Rehab   Complete by:  As directed    1-2 weeks transitional care follow up     Allergies as of 07/30/2017      Reactions   Phenergan [promethazine Hcl] Swelling, Other (See Comments)   Cannot be combined with Zofran because swelling of the eyes resulted and patient became very restless-   Zofran [ondansetron Hcl] Swelling, Other (See Comments)   Cannot be combined with Phenergan because swelling of the eyes resulted and patient became very restless-   Sulfamethoxazole Rash, Itching      Medication List    STOP taking these medications   amoxicillin-clavulanate 875-125 MG tablet Commonly known as:  AUGMENTIN   furosemide 20 MG tablet Commonly known as:  LASIX   lisinopril 5 MG tablet Commonly known as:  PRINIVIL,ZESTRIL   ondansetron 8 MG disintegrating tablet Commonly known as:  ZOFRAN ODT   promethazine-codeine 6.25-10 MG/5ML syrup Commonly known as:  PHENERGAN with CODEINE   tobramycin 0.3 % ophthalmic solution Commonly known as:  TOBREX     TAKE these medications   acetaminophen 325 MG tablet Commonly known as:  TYLENOL Take 1-2 tablets (325-650 mg total) by mouth every 4 (four) hours as needed for mild pain.   cholecalciferol 1000 units tablet Commonly known as:  VITAMIN D Take 1,000 Units by mouth daily.   COMBIGAN 0.2-0.5 % ophthalmic solution Generic drug:  brimonidine-timolol Place 1 drop into both eyes every 12 (twelve) hours.   ELIQUIS 5 MG Tabs  tablet Generic drug:  apixaban TAKE 1 TABLET (5 MG TOTAL) BY MOUTH 2 (TWO) TIMES DAILY.   Fluticasone-Salmeterol 100-50 MCG/DOSE Aepb Commonly known as:  ADVAIR DISKUS Inhale 1 puff into the lungs 2 (two) times daily.   imatinib 100 MG tablet Commonly known as:  GLEEVEC Take 300 mg by mouth daily at 12 noon. Take with meals and large glass of water.Caution:Chemotherapy   isosorbide mononitrate 30 MG 24 hr tablet Commonly known as:  IMDUR Take 1 tablet (30 mg total) by mouth daily.   lidocaine 5 % Commonly known as:  LIDODERM Place 1 patch onto the skin daily as needed (for back pain). Remove & Discard patch within 12 hours or as directed by MD   loperamide 2 MG capsule Commonly known as:  IMODIUM Take 2-4 mg by mouth 3 (three) times daily as needed for diarrhea or loose stools.   LORazepam 0.5 MG tablet Commonly known as:  ATIVAN TAKE 1 TABLET BY MOUTH AT BEDTIME AS NEEDED FOR SLEEP   meclizine 25 MG tablet Commonly known as:  ANTIVERT Take  1 tablet (25 mg total) by mouth 3 (three) times daily as needed for dizziness or nausea.   Melatonin 3 MG Tabs Take 3 mg by mouth at bedtime as needed (for sleep).   multivitamin tablet Take 1 tablet by mouth daily.   naphazoline-glycerin 0.012-0.2 % Soln Commonly known as:  CLEAR EYES REDNESS Place 1-2 drops into the left eye 3 (three) times daily after meals.   NITROSTAT 0.4 MG SL tablet Generic drug:  nitroGLYCERIN PLACE 1 TABLET UNDER THE TONGUE EVERY 5 MINUTES X3 DOSES AS NEEDED FOR CHEST PAIN   predniSONE 1 MG tablet Commonly known as:  DELTASONE Take 3 tablets (3 mg total) by mouth daily with breakfast. What changed:  Another medication with the same name was removed. Continue taking this medication, and follow the directions you see here.   pyridostigmine 60 MG tablet Commonly known as:  MESTINON Take 60 mg by mouth daily.   ranitidine 150 MG tablet Commonly known as:  ZANTAC Take 150 mg by mouth 2 (two) times daily  as needed for heartburn.   rosuvastatin 10 MG tablet Commonly known as:  CRESTOR Take 5 mg by mouth daily.   traMADol 50 MG tablet Commonly known as:  ULTRAM Take 2 tablets (100 mg total) by mouth every 8 (eight) hours. What changed:    how much to take  when to take this  reasons to take this      Follow-up Information    Kathrynn Ducking, MD. Call.   Specialty:  Neurology Why:  for follow up appointment in 4-6 weeks (after TIA) Contact information: [address] [place] [address] [phone]        Renato Shin, MD. Call in 1 day(s).   Specialty:  Endocrinology Why:  for post hospital follow up appointment Contact information: [address] [place] [address] [phone]        Charlett Blake, MD. Call.   Specialty:  Physical Medicine and Rehabilitation Why:  as needed Contact information: [name] [address] [phone]           Signed: Bary Leriche 08/11/2017, 6:47 PM

## 2017-07-27 NOTE — Progress Notes (Signed)
Subjective/Complaints: We discussed prior stroke, reviewed clinic notes from 2017  ROS neg CP, SOB, NVD, freq stools , no abd pain Objective: Vital Signs: Blood pressure 133/75, pulse 80, temperature 98 F (36.7 C), temperature source Oral, resp. rate 12, weight 80.6 kg (177 lb 11.2 oz), SpO2 97 %. No results found. Results for orders placed or performed during the hospital encounter of 07/23/17 (from the past 72 hour(s))  CBC WITH DIFFERENTIAL     Status: Abnormal   Collection Time: 07/24/17  7:39 AM  Result Value Ref Range   WBC 8.3 4.0 - 10.5 K/uL   RBC 3.93 (L) 4.22 - 5.81 MIL/uL   Hemoglobin 12.6 (L) 13.0 - 17.0 g/dL   HCT 38.2 (L) 39.0 - 52.0 %   MCV 97.2 78.0 - 100.0 fL   MCH 32.1 26.0 - 34.0 pg   MCHC 33.0 30.0 - 36.0 g/dL   RDW 14.3 11.5 - 15.5 %   Platelets 170 150 - 400 K/uL   Neutrophils Relative % 64 %   Neutro Abs 5.4 1.7 - 7.7 K/uL   Lymphocytes Relative 22 %   Lymphs Abs 1.8 0.7 - 4.0 K/uL   Monocytes Relative 11 %   Monocytes Absolute 0.9 0.1 - 1.0 K/uL   Eosinophils Relative 1 %   Eosinophils Absolute 0.1 0.0 - 0.7 K/uL   Basophils Relative 1 %   Basophils Absolute 0.0 0.0 - 0.1 K/uL   Immature Granulocytes 1 %   Abs Immature Granulocytes 0.0 0.0 - 0.1 K/uL    Comment: Performed at [facility], [address], [place], [place] [address]  Comprehensive metabolic panel     Status: Abnormal   Collection Time: 07/24/17  7:39 AM  Result Value Ref Range   Sodium 142 135 - 145 mmol/L   Potassium 4.0 3.5 - 5.1 mmol/L    Comment: NO VISIBLE HEMOLYSIS   Chloride 109 101 - 111 mmol/L   CO2 29 22 - 32 mmol/L   Glucose, Bld 102 (H) 65 - 99 mg/dL   BUN 10 6 - 20 mg/dL   Creatinine, Ser 1.45 (H) 0.61 - 1.24 mg/dL   Calcium 8.9 8.9 - 10.3 mg/dL   Total Protein 5.6 (L) 6.5 - 8.1 g/dL   Albumin 3.6 3.5 - 5.0 g/dL   AST 37 15 - 41 U/L   ALT 32 17 - 63 U/L   Alkaline Phosphatase 45 38 - 126 U/L   Total Bilirubin 0.8 0.3 - 1.2 mg/dL   GFR calc non Af Amer 44  (L) >60 mL/min   GFR calc Af Amer 51 (L) >60 mL/min    Comment: (NOTE) The eGFR has been calculated using the CKD EPI equation. This calculation has not been validated in all clinical situations. eGFR's persistently <60 mL/min signify possible Chronic Kidney Disease.    Anion gap 4 (L) 5 - 15    Comment: Performed at [facility] [address] [place], [name] [address]  Glucose, capillary     Status: Abnormal   Collection Time: 07/24/17 11:35 AM  Result Value Ref Range   Glucose-Capillary 106 (H) 65 - 99 mg/dL  Glucose, capillary     Status: Abnormal   Collection Time: 07/24/17  4:44 PM  Result Value Ref Range   Glucose-Capillary 144 (H) 65 - 99 mg/dL  Glucose, capillary     Status: Abnormal   Collection Time: 07/24/17  9:34 PM  Result Value Ref Range   Glucose-Capillary 139 (H) 65 -  99 mg/dL   Comment 1 Notify RN   Glucose, capillary     Status: None   Collection Time: 07/25/17  6:38 AM  Result Value Ref Range   Glucose-Capillary 95 65 - 99 mg/dL  Glucose, capillary     Status: Abnormal   Collection Time: 07/25/17 11:37 AM  Result Value Ref Range   Glucose-Capillary 142 (H) 65 - 99 mg/dL  Glucose, capillary     Status: Abnormal   Collection Time: 07/25/17  4:44 PM  Result Value Ref Range   Glucose-Capillary 117 (H) 65 - 99 mg/dL  Glucose, capillary     Status: Abnormal   Collection Time: 07/25/17  9:13 PM  Result Value Ref Range   Glucose-Capillary 163 (H) 65 - 99 mg/dL  Glucose, capillary     Status: None   Collection Time: 07/26/17  6:44 AM  Result Value Ref Range   Glucose-Capillary 93 65 - 99 mg/dL  Glucose, capillary     Status: Abnormal   Collection Time: 07/26/17 11:49 AM  Result Value Ref Range   Glucose-Capillary 106 (H) 65 - 99 mg/dL  Glucose, capillary     Status: Abnormal   Collection Time: 07/26/17  5:05 PM  Result Value Ref Range   Glucose-Capillary 149 (H) 65 - 99 mg/dL  Glucose, capillary     Status: Abnormal   Collection Time: 07/26/17   9:18 PM  Result Value Ref Range   Glucose-Capillary 120 (H) 65 - 99 mg/dL  Glucose, capillary     Status: None   Collection Time: 07/27/17  6:09 AM  Result Value Ref Range   Glucose-Capillary 98 65 - 99 mg/dL     HEENT: Eyes injected Cardio: RRR and no murmur Resp: CTA B/L and Unlabored GI: BS positive and NT, ND Extremity:  Pulses positive and No Edema Skin:   Intact Neuro: Alert/Oriented, Normal Sensory and Abnormal Motor 4+ RUE adn RLE, 5/5 left side UE adn LE Musc/Skel:  Normal and Other no pain with UE or LE AROM Gen NAD   Assessment/Plan: 1. Functional deficits secondary to Right hemiparesis left subcortical infarct which require 3+ hours per day of interdisciplinary therapy in a comprehensive inpatient rehab setting. Physiatrist is providing close team supervision and 24 hour management of active medical problems listed below. Physiatrist and rehab team continue to assess barriers to discharge/monitor patient progress toward functional and medical goals. FIM: Function - Bathing Position: Shower Body parts bathed by patient: Right arm, Right lower leg, Left arm, Left lower leg, Abdomen, Front perineal area, Buttocks, Right upper leg, Left upper leg, Chest Bathing not applicable: Back Assist Level: Touching or steadying assistance(Pt > 75%)  Function- Upper Body Dressing/Undressing What is the patient wearing?: Pull over shirt/dress Pull over shirt/dress - Perfomed by patient: Thread/unthread right sleeve, Thread/unthread left sleeve, Put head through opening, Pull shirt over trunk Assist Level: Supervision or verbal cues Function - Lower Body Dressing/Undressing What is the patient wearing?: Pants, Socks, Shoes, Underwear Position: Wheelchair/chair at [employer] - Performed by patient: Thread/unthread right underwear leg, Thread/unthread left underwear leg, Pull underwear up/down Pants- Performed by patient: Thread/unthread right pants leg, Thread/unthread left pants  leg, Pull pants up/down Non-skid slipper socks- Performed by patient: Don/doff left sock Socks - Performed by patient: Don/doff left sock, Don/doff right sock Shoes - Performed by patient: Don/doff right shoe, Don/doff left shoe, Fasten right, Fasten left Shoes - Performed by helper: Fasten right, Fasten left Assist for footwear: Supervision/touching assist Assist for lower body dressing: Supervision  or verbal cues  Function - Toileting Toileting steps completed by patient: Adjust clothing prior to toileting, Performs perineal hygiene, Adjust clothing after toileting Toileting steps completed by helper: Adjust clothing prior to toileting, Adjust clothing after toileting Toileting Assistive Devices: Grab bar or rail Assist level: Touching or steadying assistance (Pt.75%)  Function - Toilet Transfers Toilet transfer assistive device: Grab bar, Walker Assist level to toilet: Touching or steadying assistance (Pt > 75%) Assist level from toilet: Touching or steadying assistance (Pt > 75%)  Function - Chair/bed transfer Chair/bed transfer method: Stand pivot Chair/bed transfer assist level: Touching or steadying assistance (Pt > 75%) Chair/bed transfer details: Verbal cues for sequencing, Verbal cues for technique, Verbal cues for precautions/safety  Function - Locomotion: Wheelchair Will patient use wheelchair at discharge?: No Max wheelchair distance: 100 Assist Level: Supervision or verbal cues Assist Level: Supervision or verbal cues Turns around,maneuvers to table,bed, and toilet,negotiates 3% grade,maneuvers on rugs and over doorsills: No Function - Locomotion: Ambulation Assistive device: Walker-rolling Max distance: 100 Assist level: Touching or steadying assistance (Pt > 75%) Assist level: Touching or steadying assistance (Pt > 75%) Assist level: Touching or steadying assistance (Pt > 75%) Walk 150 feet activity did not occur: Safety/medical concerns Walk 10 feet on uneven  surfaces activity did not occur: Safety/medical concerns  Function - Comprehension Comprehension: Auditory Comprehension assistive device: Hearing aids Comprehension assist level: Understands complex 90% of the time/cues 10% of the time  Function - Expression Expression: Verbal Expression assist level: Expresses complex 90% of the time/cues < 10% of the time  Function - Social Interaction Social Interaction assist level: Interacts appropriately 90% of the time - Needs monitoring or encouragement for participation or interaction.  Function - Problem Solving Problem solving assist level: Solves complex 90% of the time/cues < 10% of the time  Function - Memory Memory assist level: Recognizes or recalls 90% of the time/requires cueing < 10% of the time Patient normally able to recall (first 3 days only): Current season, Location of own room, Staff names and faces, That he or she is in a hospital  Medical Problem List and Plan:  1. Right hemiparesis and functional deficits secondary to ?left subcortical infarct, hx of myasthenia gravis.  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 DVT Prophylaxis/Anticoagulation: Pharmaceutical: Other (comment)--Eliquis  3. Pain Management: tylenol prn.  4. Mood: LCSW to follow for evaluation and support.  5. Neuropsych: This patient is capable of making decisions on his own behalf.  6. Skin/Wound Care: Routine pressure relief measures.  7. Fluids/Electrolytes/Nutrition: Monitor I/O. Check lytes in am.  8. MG: On Mestinon and prednisone.  9. 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 GERD: Resume Zantac bid.  12. HTN: Monitor BP bid--continue Imdur.  Vitals:   07/26/17 2039 07/27/17 0454  BP: (!) 147/86 133/75  Pulse: 84 80  Resp:  16 12  Temp: 98.6 F (37 C) 98 F (36.7 C)  SpO2: 99% 97%  Fair control 6/18 13. CAD : Monitor for symptoms with activity. On Imdur and Crestor.  14. Glaucoma: Managed with Timolol and Alphagan bid.  -on tobrex eye gtts recently for recent infection  -steroid gtts for inflammation (already receiving prednisone PO but low dose)   15.  CKD  3  LOS (Days) 4 A FACE TO FACE EVALUATION WAS PERFORMED  Charlett Blake 07/27/2017, 6:35 AM

## 2017-07-27 NOTE — Progress Notes (Signed)
Occupational Therapy Session Note  Patient Details  Name: [name] MRN: [id number] Date of Birth: [birth year]  Today's Date: 07/27/2017 OT Individual Time: 1451-1541 OT Individual Time Calculation (min): 50 min    Short Term Goals: Week 1:  OT Short Term Goal 1 (Week 1): Pt will ambulate into bathroom with no more than min A OT Short Term Goal 2 (Week 1): Pt will maintain standing balance within BADL task with no more than CGA OT Short Term Goal 3 (Week 1): Pt will demonstrate improved safety awareness by locking wc breaks prior to standing without verbal cues  Skilled Therapeutic Interventions/Progress Updates:    Pt completed functional mobility to the day room with supervision without use of an assistive device.  No LOB noted.  Once in the day room had pt work on ankle and hip strategies in standing using the Wii balance board.  He was able to tolerate multiple sets of standing for greater than 15 mins before needing rest break.  Decreased efficiency noted with ankle and hip strategies and pt instead flexing his trunk and knees to try to get his weight to shift rather than staving stationary in his trunk and pelvis and just working on hip and ankle movements.  Finished session with ambulation back to the room and pt completing transfer into the bathroom with supervision.  Pt's spouse was in the room as well and pt was instructed to pull the call button when he was finished and ready to come out.  Nursing also made aware of pt being in the bathroom.    Therapy Documentation Precautions:  Precautions Precautions: Fall Restrictions Weight Bearing Restrictions: No  Pain: Pain Assessment Pain Scale: 0-10 Pain Score: 0-No pain ADL: See Function Navigator for Current Functional Status.   Therapy/Group: Individual Therapy  Sylvie Mifsud OTR/L 07/27/2017, 4:39 PM

## 2017-07-27 NOTE — Progress Notes (Signed)
Occupational Therapy Session Note  Patient Details  Name: [name] MRN: [id number] Date of Birth: 06-02-1940  Today's Date: 07/27/2017 OT Individual Time: 9574-7340 OT Individual Time Calculation (min): 59 min    Short Term Goals: Week 1:  OT Short Term Goal 1 (Week 1): Pt will ambulate into bathroom with no more than min A OT Short Term Goal 2 (Week 1): Pt will maintain standing balance within BADL task with no more than CGA OT Short Term Goal 3 (Week 1): Pt will demonstrate improved safety awareness by locking wc breaks prior to standing without verbal cues  Skilled Therapeutic Interventions/Progress Updates:    Pt completed shower and dressing during session.  He was able to ambulate around the room with min guard assist for all transfers and for gathering clothing for dressing tasks.  Bathing with close supervision sit to stand and use of the grab bar for support when standing.  He donned his underpants and pants in standing but exhibited difficulty requiring min guard assist.  Educated pt that it would be safer to sit down and donn his clothing and he agreed.  He stood at the sink for grooming tasks of brushing his teeth, combing his hair, and shaving.  He donned socks and shoes sitting from the bedside recliner.  Next had pt ambulate to the gym with min guard assist for next part of session.  Worked on standing balance with use of the foam having pt perform head turns on the foam with min guard assist.  Also had him work on self ball toss in standing on and off of the foam.  He needed min assist for balance when standing on the foam and tossing and catching the ball.  Finished session with ambulation back to the room and pt left sitting in the wheelchair with call button and phone in reach.  Safety belt in place and chair alarm activated as well.    Therapy Documentation Precautions:  Precautions Precautions: Fall Restrictions Weight Bearing Restrictions: No   Pain: Pain  Assessment Pain Score: 0-No pain ADL: See Function Navigator for Current Functional Status.   Therapy/Group: Individual Therapy  Clearnce Leja OTR/L 07/27/2017, 12:13 PM

## 2017-07-27 NOTE — Progress Notes (Signed)
Physical Therapy Session Note  Patient Details  Name: [name] MRN: [id number] Date of Birth: 03/05/1940  Today's Date: 07/27/2017 PT Individual Time: 1000-1100 and 1300-1330 PT Individual Time Calculation (min): 60 min and 30 min (total 90 min)   Short Term Goals: Week 1:  PT Short Term Goal 1 (Week 1): Pt will ambulate x 100 ft with LRAD and min A consistently PT Short Term Goal 2 (Week 1): Pt will ascend/descend 4 stairs with one handrail and min A PT Short Term Goal 3 (Week 1): Pt will perform bed to/from chair transfers safely and with SBA  Skilled Therapeutic Interventions/Progress Updates: Tx 1: Denies pain, agreeable to treatment. Six minute walk test with total of 279 m, no AD and S/min guard throughout; norms for 27-31 year old community dwelling male is 20 m. Educated pt on performance, implications on fall risk, and use of test to assess further progress over time. Gait outdoors 2x >500', on level/unlevel surfaces including grass and ramps; min guard overall, minA with mild LOB descending small ramp while attempting to look down the street. Occasional smaller LOBs with quick direction changes and head turns in distracting environments. Engaged in conversation while walking to facilitate dual task attention. Sit <>stand no UE support 2x10 reps for LE strengthening. Gait to return to room with min guard, no AD. Remained in w/c, quick release belt intact, all needs in reach.   Tx 2: Pt received in w/c, denies pain and agreeable to treatment. Gait to day room x200' with min guard, no AD. Engaged pt in [place], level platform progressed BUE>no UE assist, unlocked unstable platform BUE>no UE assist with increased difficulty with posterior weight shifting/righting reactions. Gait in hall including braiding with BUE support mod cues for technique, backwards and sideways walking no UE support for focus on LE/glute med/max strengthening and LE coordination. Tandem walking in parallel  bars 4x10' with min guard, occasional UE use on bars to regain balance. Standing static tandem stance 1 x30 sec each lead LE. Educated pt on role of hip abduction strength and carryover into balance/gait. Gait to return to room min guard. Remained in w/c at end of session, chair alarm intact, all needs in reach.      Therapy Documentation Precautions:  Precautions Precautions: Fall Restrictions Weight Bearing Restrictions: No Pain: Pain Assessment Pain Score: 0-No pain   See Function Navigator for Current Functional Status.   Therapy/Group: Individual Therapy  Luberta Mutter 07/27/2017, 11:02 AM

## 2017-07-27 NOTE — Plan of Care (Signed)
  Problem: Consults Goal: RH STROKE PATIENT EDUCATION Description See Patient Education module for education specifics  Outcome: Progressing   Problem: RH SKIN INTEGRITY Goal: RH STG SKIN FREE OF INFECTION/BREAKDOWN Description Patients skin will remain free from further infection or breakdown with min assist.   Outcome: Progressing Goal: RH STG MAINTAIN SKIN INTEGRITY WITH ASSISTANCE Description STG Maintain Skin Integrity With min Assistance.  Outcome: Progressing Flowsheets (Taken 07/27/2017 1325) STG: Maintain skin integrity with assistance: 5-Supervision/set up   Problem: RH SAFETY Goal: RH STG ADHERE TO SAFETY PRECAUTIONS W/ASSISTANCE/DEVICE Description STG Adhere to Safety Precautions With min Assistance/Device.  Outcome: Progressing Flowsheets (Taken 07/27/2017 1325) STG:Pt will adhere to safety precautions with assistance/device: 5-Supervision/set up   Problem: RH COGNITION-NURSING Goal: RH STG USES MEMORY AIDS/STRATEGIES W/ASSIST TO PROBLEM SOLVE Description STG Uses Memory Aids/Strategies With min Assistance to Problem Solve.  Outcome: Progressing Flowsheets (Taken 07/27/2017 1325) STG: Uses memory aids/strategies with assistance: 5-Supervision/set up   Problem: RH KNOWLEDGE DEFICIT Goal: RH STG INCREASE KNOWLEDGE OF HYPERTENSION Description With min assist.  Outcome: Progressing

## 2017-07-28 ENCOUNTER — Inpatient Hospital Stay (HOSPITAL_COMMUNITY): Payer: Medicare Other | Admitting: Occupational Therapy

## 2017-07-28 ENCOUNTER — Inpatient Hospital Stay (HOSPITAL_COMMUNITY): Payer: Medicare Other | Admitting: Physical Therapy

## 2017-07-28 LAB — BASIC METABOLIC PANEL
Anion gap: 6 (ref 5–15)
BUN: 14 mg/dL (ref 6–20)
CO2: 27 mmol/L (ref 22–32)
Calcium: 8.8 mg/dL — ABNORMAL LOW (ref 8.9–10.3)
Chloride: 109 mmol/L (ref 101–111)
Creatinine, Ser: 1.63 mg/dL — ABNORMAL HIGH (ref 0.61–1.24)
GFR calc Af Amer: 45 mL/min — ABNORMAL LOW (ref >60)
GFR calc non Af Amer: 38 mL/min — ABNORMAL LOW (ref >60)
Glucose, Bld: 112 mg/dL — ABNORMAL HIGH (ref 65–99)
POTASSIUM: 3.7 mmol/L (ref 3.5–5.1)
SODIUM: 142 mmol/L (ref 135–145)

## 2017-07-28 LAB — GLUCOSE, CAPILLARY
GLUCOSE-CAPILLARY: 106 mg/dL — AB (ref 65–99)
Glucose-Capillary: 101 mg/dL — ABNORMAL HIGH (ref 65–99)
Glucose-Capillary: 138 mg/dL — ABNORMAL HIGH (ref 65–99)
Glucose-Capillary: 188 mg/dL — ABNORMAL HIGH (ref 65–99)

## 2017-07-28 NOTE — Progress Notes (Signed)
Social Work Patient ID: [name], male   DOB: 1940-11-09, 77 y.o.   MRN: [id number]  Met with pt and wife to discuss team conference goals mod/i level and target discharge date 6/21. Both pleased with this plan and are in agreement with OPPT. Will work toward this plan.

## 2017-07-28 NOTE — Progress Notes (Signed)
Social Work   Ramell Wacha, Eliezer Champagne  Social Worker  Physical Medicine and Rehabilitation  Patient Care Conference  Signed  Date of Service:  07/28/2017  2:26 PM          Signed          Show:Clear all [x] Manual[x] Template[] Copied  Added by: [x] Raushanah Osmundson, Gardiner Rhyme, LCSW   [] Hover for details   Inpatient RehabilitationTeam Conference and Plan of Care Update Date: 07/28/2017   Time: 10:00 AM      Patient Name: [name]      Medical Record Number: [id number]  Date of Birth: [birth year] Sex: Male         Room/Bed: 4M01C/4M01C-01 Payor Info: Payor: MEDICARE / Plan: MEDICARE PART A AND B / Product Type: *No Product type* /     Admitting Diagnosis: TIA Ho mg  Admit Date/Time:  07/23/2017  5:04 PM Admission Comments: No comment available    Primary Diagnosis:  <principal problem not specified> Principal Problem: <principal problem not specified>       Patient Active Problem List    Diagnosis Date Noted  . Transient ischemic attack (TIA) 07/23/2017  . Subcortical infarction ([place])    . Right hemiparesis ([place])    . Ataxia 07/21/2017  . TIA (transient ischemic attack)    . Thrombocytopenia ([place])    . Wheezing 06/17/2017  . Bursitis of left elbow 05/01/2017  . Dystonia 12/29/2016  . PAD (peripheral artery disease) ([place]) 11/18/2016  . Osteoporosis 08/28/2016  . Paronychia of great toe, left 08/10/2016  . Carotid arterial disease ([place]) 07/09/2016  . Hypocalcemia 07/09/2016  . Diplopia 07/02/2016  . S/P lumbar laminectomy 03/11/2016  . Radicular pain of left lower extremity 02/21/2016  . Cough 01/10/2016  . 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 Myasthenia gravis ([place])    . Diabetes mellitus type 2 in nonobese (HCC)    . Coronary artery disease involving native coronary artery of native heart without angina pectoris    . Acute blood loss anemia    . Ataxia, post-stroke    . Gait disturbance, post-stroke    . Proximal leg weakness    .  Cervical spondylosis without myelopathy    . Achilles tendinitis of right lower extremity    . Chronic diastolic CHF (congestive heart failure) ([place])    . Gastroesophageal reflux disease without esophagitis    . Chronic kidney disease    . 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 Cerebellar stroke ([place])    . Ischemic stroke ([place])    . 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 History of pulmonary embolus (PE)    . Chronic anticoagulation    . HLD (hyperlipidemia)    . Neck pain 03/19/2015  . Pain in the chest    . 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 Chronic diastolic heart failure, NYHA class 1 ([place]) 03/07/2015  . Diabetic gastroparesis ([place]) 03/07/2015  . Screening for osteoporosis 07/19/2014  . Edema 04/09/2014  . Routine general medical examination at a health care facility 10/23/2013  . Bilateral carotid bruits 05/24/2013  . Abnormal CT scan, lung 05/24/2013  . Encounter for therapeutic drug monitoring 03/14/2013  . Lymphadenopathy, inguinal 01/11/2013  . AKI (acute kidney injury) ([place]) 10/28/2012  . 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 Myasthenia gravis with exacerbation ([place]) 03/31/2012  . Disease of salivary gland 12/09/2011  .  Left facial pain 10/28/2011  . Pulmonary embolism ([place]) 10/25/2011  . 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 GERD with stricture 09/23/2011  . Long term (current) use of anticoagulants 08/03/2011  . History of pulmonary embolism 07/30/2011  . History of DVT (deep vein thrombosis) 07/30/2011  . Subconjunctival hemorrhage 06/22/2011  . Bilateral conjunctivitis 04/09/2011  . Diabetes mellitus type 2, controlled ([place]) 12/18/2010  . Hearing loss 08/04/2010  . Ptosis of eyelid 12/30/2009  . Essential hypertension 04/24/2009  . HYPERLIPIDEMIA, MIXED 10/05/2008  . HEMORRHOIDS-INTERNAL 07/02/2008  . HEMORRHOIDS-EXTERNAL 07/02/2008  .  DIVERTICULOSIS-COLON 07/02/2008  . PERSONAL HX COLONIC POLYPS 07/02/2008  . RASH-NONVESICULAR 02/07/2008  . PNEUMONIA 12/08/2007  . 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 CAD (coronary artery disease) 07/07/2007  . NEPHROLITHIASIS, HX OF 07/07/2007      Expected Discharge Date: Expected Discharge Date: 07/30/17   Team Members Present: Physician leading conference: Dr. Alysia Penna Social Worker Present: Ovidio Kin, LCSW Nurse Present: Junius Creamer, RN PT Present: Kem Parkinson, PT OT Present: Willeen Cass, OT SLP Present: Windell Moulding, SLP PPS Coordinator present : Daiva Nakayama, RN, CRRN       Current Status/Progress Goal Weekly Team Focus  Medical     No Issues, HTN controlled , conjunctivitis now on Dexa eye drops  maintainmedical stability  D/C planning   Bowel/Bladder     continent of bowel & bladder, LBM 6/18  remain continent  assist as needed   Swallow/Nutrition/ Hydration               ADL's     Supervision for bathing, dressing, toileting, functional transfers  supervision to modified independent upgraded  selfcare retraining, transfer training, balance retraining, neuromuscular re-education, pt/family education, DME education   Mobility     min guard overall  S overall  Dynamic standing balance, activity tolerance   Communication               Safety/Cognition/ Behavioral Observations             Pain     no c/o pain, has tylenol & tramadol prn, has not used  pain scale <3/10  assess q shift & treat as needed   Skin     no skin break down  no new skin break down  assess q shift     *See Care Plan and progress notes for long and short-term goals.      Barriers to Discharge   Current Status/Progress Possible Resolutions Date Resolved   Physician     Medical stability     progressing toward goals  Plan d/c after pt, family ed      Nursing                 PT                    OT                 SLP            SW               Discharge Planning/Teaching Needs:  Return home with wife who can provide supervision level, pt doing well and will be mod/i. Short length of stay.      Team Discussion:  Goals mod/i level progressing toward these goals. Balance and higher level functioning work on. Medically stable  For DC Friday. Wife to provide supervision level at home.  Revisions  to Treatment Plan:  DC 6/21    Continued Need for Acute Rehabilitation Level of Care: The patient requires daily medical management by a physician with specialized training in physical medicine and rehabilitation for the following conditions: Daily direction of a multidisciplinary physical rehabilitation program to ensure safe treatment while eliciting the highest outcome that is of practical value to the patient.: Yes Daily medical management of patient stability for increased activity during participation in an intensive rehabilitation regime.: Yes Daily analysis of laboratory values and/or radiology reports with any subsequent need for medication adjustment of medical intervention for : Neurological problems   [name], Gardiner Rhyme 07/28/2017, 2:26 PM                Elease Hashimoto, LCSW Social Worker Signed  Patient Care Conference 06/26/2015 12:48 PM    Expand All Collapse All   Inpatient RehabilitationTeam Conference and Plan of Care Update Date: 06/26/2015   Time: 10:55 AM     Patient Name: [name]       Medical Record Number: [id number]  Date of Birth: 1940/10/12 Sex: Male         Room/Bed: 4W22C/4W22C-01 Payor Info: Payor: MEDICARE / Plan: MEDICARE PART A AND B / Product Type: *No Product type* /    Admitting Diagnosis: CVA  Admit Date/Time:  06/24/2015  5:48 PM Admission Comments: No comment available   Primary Diagnosis:  <principal problem not specified> Principal Problem: <principal problem not specified>    Patient Active Problem List     Diagnosis  Date Noted   .  Stroke due to embolism of vertebral  artery ([place])  06/24/2015   .  Myasthenia gravis ([place])     .  Diabetes mellitus type 2 in nonobese (HCC)     .  Coronary artery disease involving native coronary artery of native heart without angina pectoris     .  Acute blood loss anemia     .  Ataxia, post-stroke     .  Gait disturbance, post-stroke     .  Proximal leg weakness     .  Cervical spondylosis without myelopathy     .  Achilles tendinitis of right lower extremity     .  Chronic diastolic CHF (congestive heart failure) ([place])     .  Gastroesophageal reflux disease without esophagitis     .  Chronic kidney disease     .  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  Cerebellar stroke ([place])     .  Ischemic stroke ([place])     .  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  History of pulmonary embolus (PE)     .  Chronic anticoagulation     .  HLD (hyperlipidemia)     .  Neck pain  03/19/2015   .  Renal insufficiency  03/13/2015   .  Pain in the chest     .  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  Chest pain syndrome  03/07/2015   .  Chronic diastolic heart failure, NYHA class 1 ([place])  03/07/2015   .  Diabetic gastroparesis ([place])  03/07/2015   .  Screening for osteoporosis  07/19/2014   .  Routine general medical examination at a health care facility  10/23/2013   .  Bilateral carotid bruits  05/24/2013   .  Abnormal  CT scan, lung  05/24/2013   .  Encounter for therapeutic drug monitoring  03/14/2013   .  Lymphadenopathy, inguinal  01/11/2013   .  Spinal stenosis of lumbar region  06/01/2012   .  Myasthenia gravis with exacerbation ([place])  03/31/2012   .  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  GERD with stricture  09/23/2011   .  Long term (current) use of anticoagulants  08/03/2011   .  History of pulmonary embolism  07/30/2011   .  History of DVT (deep vein thrombosis)  07/30/2011   .  Subconjunctival hemorrhage   06/22/2011   .  Bilateral conjunctivitis  04/09/2011   .  Diabetes mellitus type 2, controlled ([place])  12/18/2010   .  Hearing loss  08/04/2010   .  Ptosis of eyelid  12/30/2009   .  Essential hypertension  04/24/2009   .  HYPERLIPIDEMIA, MIXED  10/05/2008   .  HEMORRHOIDS-INTERNAL  07/02/2008   .  HEMORRHOIDS-EXTERNAL  07/02/2008   .  DIVERTICULOSIS-COLON  07/02/2008   .  PERSONAL HX COLONIC POLYPS  07/02/2008   .  RASH-NONVESICULAR  02/07/2008   .  PNEUMONIA  12/08/2007   .  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  CAD (coronary artery disease)  07/07/2007   .  NEPHROLITHIASIS, HX OF  07/07/2007     Expected Discharge Date: Expected Discharge Date: 06/29/15  Team Members Present: Physician leading conference: Dr. Alysia Penna Social Worker Present: Ovidio Kin, LCSW PT Present: Georjean Mode, Dustin Folks, PT OT Present: Clyda Greener, OT SLP Present: Windell Moulding, SLP PPS Coordinator present : Daiva Nakayama, RN, CRRN        Current Status/Progress  Goal  Weekly Team Focus   Medical     Occasional dizziness. Patient eager to go home  Home discharge with wife support  Discharge planning   Bowel/Bladder     cont x2 LBM: 06/25/15  remain cont   contine with plan of care   Swallow/Nutrition/ Hydration               ADL's     steadying A with balance with all self care  mod I toileting, S bathing in shower, shower transfers and LB dressing due to vertigo  vestibular exercises, dynamic balance, ADL training with AE, pt/family education   Mobility     min assist overall; needs cueing for safe use of RW  modified independent overall including 9 steps with rail  neuro re-ed, vestibular challenges, balance, pt and family ed, high level gait   Communication     mild higher level word finding difficulty   mod I   education and carryover of compensatory strategies    Safety/Cognition/ Behavioral Observations              Pain     no complaints of pain   <2   assess and treat q shift and PRN    Skin     no issues   remain free from breakdown   routine pressure relief measures       *See Care Plan and progress notes for long and short-term goals.    Barriers to Discharge:  Still has balance issues     Possible Resolutions to Barriers:   Continue rehabilitation and set up outpatient therapy      Discharge Planning/Teaching Needs:   Home with wife who can provide supervision level, has been here to observe in therapies  Team Discussion:    Goals supervision to mod/i level, will need some supervision for bathing and dressing. Vestibular eval today for his dizziness.Has no balance reactions. Wife here and feels he is doing well.    Revisions to Treatment Plan:    None    Continued Need for Acute Rehabilitation Level of Care: The patient requires daily medical management by a physician with specialized training in physical medicine and rehabilitation for the following conditions: Daily direction of a multidisciplinary physical rehabilitation program to ensure safe treatment while eliciting the highest outcome that is of practical value to the patient.: Yes Daily medical management of patient stability for increased activity during participation in an intensive rehabilitation regime.: Yes Daily analysis of laboratory values and/or radiology reports with any subsequent need for medication adjustment of medical intervention for : Neurological problems  Elease Hashimoto 06/26/2015, 12:48 PM                  Patient ID: [name], male   DOB: 1940-09-07, 77 y.o.   MRN: [id number]

## 2017-07-28 NOTE — Patient Care Conference (Signed)
Inpatient RehabilitationTeam Conference and Plan of Care Update Date: 07/28/2017   Time: 10:00 AM    Patient Name: [name]      Medical Record Number: [id number]  Date of Birth: [birth year] Sex: Male         Room/Bed: 4M01C/4M01C-01 Payor Info: Payor: MEDICARE / Plan: MEDICARE PART A AND B / Product Type: *No Product type* /    Admitting Diagnosis: TIA Ho mg  Admit Date/Time:  07/23/2017  5:04 PM Admission Comments: No comment available   Primary Diagnosis:  <principal problem not specified> Principal Problem: <principal problem not specified>  Patient Active Problem List   Diagnosis Date Noted  . Transient ischemic attack (TIA) 07/23/2017  . Subcortical infarction ([place])   . Right hemiparesis ([place])   . Ataxia 07/21/2017  . TIA (transient ischemic attack)   . Thrombocytopenia ([place])   . Wheezing 06/17/2017  . Bursitis of left elbow 05/01/2017  . Dystonia 12/29/2016  . PAD (peripheral artery disease) ([place]) 11/18/2016  . Osteoporosis 08/28/2016  . Paronychia of great toe, left 08/10/2016  . Carotid arterial disease ([place]) 07/09/2016  . Hypocalcemia 07/09/2016  . Diplopia 07/02/2016  . S/P lumbar laminectomy 03/11/2016  . Radicular pain of left lower extremity 02/21/2016  . Cough 01/10/2016  . 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 Myasthenia gravis ([place])   . Diabetes mellitus type 2 in nonobese (HCC)   . Coronary artery disease involving native coronary artery of native heart without angina pectoris   . Acute blood loss anemia   . Ataxia, post-stroke   . Gait disturbance, post-stroke   . Proximal leg weakness   . Cervical spondylosis without myelopathy   . Achilles tendinitis of right lower extremity   . Chronic diastolic CHF (congestive heart failure) ([place])   . Gastroesophageal reflux disease without esophagitis   . Chronic kidney disease   . 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 Cerebellar stroke ([place])   .  Ischemic stroke ([place])   . 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 History of pulmonary embolus (PE)   . Chronic anticoagulation   . HLD (hyperlipidemia)   . Neck pain 03/19/2015  . Pain in the chest   . 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 Chronic diastolic heart failure, NYHA class 1 ([place]) 03/07/2015  . Diabetic gastroparesis ([place]) 03/07/2015  . Screening for osteoporosis 07/19/2014  . Edema 04/09/2014  . Routine general medical examination at a health care facility 10/23/2013  . Bilateral carotid bruits 05/24/2013  . Abnormal CT scan, lung 05/24/2013  . Encounter for therapeutic drug monitoring 03/14/2013  . Lymphadenopathy, inguinal 01/11/2013  . AKI (acute kidney injury) ([place]) 10/28/2012  . 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 Myasthenia gravis with exacerbation ([place]) 03/31/2012  . Disease of salivary gland 12/09/2011  . Left facial pain 10/28/2011  . Pulmonary embolism ([place]) 10/25/2011  . 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 GERD with stricture 09/23/2011  . Long term (current) use of anticoagulants 08/03/2011  . History of pulmonary embolism 07/30/2011  . History of DVT (deep vein thrombosis) 07/30/2011  . Subconjunctival hemorrhage 06/22/2011  . Bilateral conjunctivitis 04/09/2011  . Diabetes mellitus type 2, controlled ([place]) 12/18/2010  . Hearing loss 08/04/2010  . Ptosis of eyelid 12/30/2009  . Essential hypertension 04/24/2009  . HYPERLIPIDEMIA, MIXED 10/05/2008  . HEMORRHOIDS-INTERNAL 07/02/2008  . HEMORRHOIDS-EXTERNAL 07/02/2008  . DIVERTICULOSIS-COLON 07/02/2008  .  PERSONAL HX COLONIC POLYPS 07/02/2008  . RASH-NONVESICULAR 02/07/2008  . PNEUMONIA 12/08/2007  . 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 CAD (coronary artery disease) 07/07/2007  . NEPHROLITHIASIS, HX OF 07/07/2007    Expected Discharge Date: Expected Discharge Date: 07/30/17  Team Members  Present: Physician leading conference: Dr. Alysia Penna Social Worker Present: Ovidio Kin, LCSW Nurse Present: Junius Creamer, RN PT Present: Kem Parkinson, PT OT Present: Willeen Cass, OT SLP Present: Windell Moulding, SLP PPS Coordinator present : Daiva Nakayama, RN, CRRN     Current Status/Progress Goal Weekly Team Focus  Medical   No Issues, HTN controlled , conjunctivitis now on Dexa eye drops  maintainmedical stability  D/C planning   Bowel/Bladder   continent of bowel & bladder, LBM 6/18  remain continent  assist as needed   Swallow/Nutrition/ Hydration             ADL's   Supervision for bathing, dressing, toileting, functional transfers  supervision to modified independent upgraded  selfcare retraining, transfer training, balance retraining, neuromuscular re-education, pt/family education, DME education   Mobility   min guard overall  S overall  Dynamic standing balance, activity tolerance   Communication             Safety/Cognition/ Behavioral Observations            Pain   no c/o pain, has tylenol & tramadol prn, has not used  pain scale <3/10  assess q shift & treat as needed   Skin   no skin break down  no new skin break down  assess q shift      *See Care Plan and progress notes for long and short-term goals.     Barriers to Discharge  Current Status/Progress Possible Resolutions Date Resolved   Physician    Medical stability     progressing toward goals  Plan d/c after pt, family ed      Nursing                  PT                    OT                  SLP                SW                Discharge Planning/Teaching Needs:  Return home with wife who can provide supervision level, pt doing well and will be mod/i. Short length of stay.      Team Discussion:  Goals mod/i level progressing toward these goals. Balance and higher level functioning work on. Medically stable  For DC Friday. Wife to provide supervision level at home.   Revisions to Treatment Plan:  DC 6/21    Continued Need for Acute Rehabilitation Level of Care: The patient requires daily medical management by a physician with specialized training in physical medicine and rehabilitation for the following conditions: Daily direction of a multidisciplinary physical rehabilitation program to ensure safe treatment while eliciting the highest outcome that is of practical value to the patient.: Yes Daily medical management of patient stability for increased activity during participation in an intensive rehabilitation regime.: Yes Daily analysis of laboratory values and/or radiology reports with any subsequent need for medication adjustment of medical intervention for : Neurological problems  Shauntelle Jamerson, Gardiner Rhyme 07/28/2017, 2:26 PM

## 2017-07-28 NOTE — Evaluation (Signed)
Recreational Therapy Assessment and Plan  Patient Details  Name: [name] MRN: [id number] Date of Birth: 19-Apr-1940 Today's Date: 07/28/2017 Time:  1355-1425 Pain:  No c/o  Rehab Potential: Good ELOS: discharge 6/21  Assessment  Problem List:      Patient Active Problem List   Diagnosis Date Noted  . Transient ischemic attack (TIA) 07/23/2017  . Subcortical infarction ([place])   . Right hemiparesis ([place])   . Ataxia 07/21/2017  . TIA (transient ischemic attack)   . Thrombocytopenia ([place])   . Wheezing 06/17/2017  . Bursitis of left elbow 05/01/2017  . Dystonia 12/29/2016  . PAD (peripheral artery disease) ([place]) 11/18/2016  . Osteoporosis 08/28/2016  . Paronychia of great toe, left 08/10/2016  . Carotid arterial disease ([place]) 07/09/2016  . Hypocalcemia 07/09/2016  . Diplopia 07/02/2016  . S/P lumbar laminectomy 03/11/2016  . Radicular pain of left lower extremity 02/21/2016  . Cough 01/10/2016  . 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 Myasthenia gravis ([place])   . Diabetes mellitus type 2 in nonobese (HCC)   . Coronary artery disease involving native coronary artery of native heart without angina pectoris   . Acute blood loss anemia   . Ataxia, post-stroke   . Gait disturbance, post-stroke   . Proximal leg weakness   . Cervical spondylosis without myelopathy   . Achilles tendinitis of right lower extremity   . Chronic diastolic CHF (congestive heart failure) ([place])   . Gastroesophageal reflux disease without esophagitis   . Chronic kidney disease   . Cerebral infarction involving left cerebellar artery ([place]) 06/20/2015  . Cerebellar stroke ([place])   . Ischemic stroke ([place])   . Cerebrovascular accident (CVA) due to thrombosis of left vertebral artery ([place])   . History of pulmonary embolus (PE)   . Chronic anticoagulation   . HLD (hyperlipidemia)   . Neck pain 03/19/2015  . Pain in the chest   . Well controlled type 2  diabetes mellitus with gastroparesis ([place])   . Chronic diastolic heart failure, NYHA class 1 ([place]) 03/07/2015  . Diabetic gastroparesis ([place]) 03/07/2015  . Screening for osteoporosis 07/19/2014  . Edema 04/09/2014  . Routine general medical examination at a health care facility 10/23/2013  . Bilateral carotid bruits 05/24/2013  . Abnormal CT scan, lung 05/24/2013  . Encounter for therapeutic drug monitoring 03/14/2013  . Lymphadenopathy, inguinal 01/11/2013  . AKI (acute kidney injury) ([place]) 10/28/2012  . 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 Myasthenia gravis with exacerbation ([place]) 03/31/2012  . Disease of salivary gland 12/09/2011  . Left facial pain 10/28/2011  . Pulmonary embolism ([place]) 10/25/2011  . 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 GERD with stricture 09/23/2011  . Long term (current) use of anticoagulants 08/03/2011  . History of pulmonary embolism 07/30/2011  . History of DVT (deep vein thrombosis) 07/30/2011  . Subconjunctival hemorrhage 06/22/2011  . Bilateral conjunctivitis 04/09/2011  . Diabetes mellitus type 2, controlled ([place]) 12/18/2010  . Hearing loss 08/04/2010  . Ptosis of eyelid 12/30/2009  . Essential hypertension 04/24/2009  . HYPERLIPIDEMIA, MIXED 10/05/2008  . HEMORRHOIDS-INTERNAL 07/02/2008  . HEMORRHOIDS-EXTERNAL 07/02/2008  . DIVERTICULOSIS-COLON 07/02/2008  . PERSONAL HX COLONIC POLYPS 07/02/2008  . RASH-NONVESICULAR 02/07/2008  . PNEUMONIA 12/08/2007  . 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 CAD (coronary artery disease) 07/07/2007  . NEPHROLITHIASIS, HX OF 07/07/2007    Past Medical History:      Past Medical History:  Diagnosis Date  . Bruises easily  d/t being on Eliquis  . Carotid artery disease ([place])    a. Duplex 05/2014: 19-50% RICA, 9-32% LICA, elevated velocities in right subclavian artery, normal left subclavian artery..  . Chronic back pain    HNP  . Chronic kidney disease (CKD)   .  Claustrophobia    takes Ativan as needed  . 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 Coronary atherosclerosis of unspecified type of vessel, native or graft    a. cath in 2003 showed 10-20% LM, scattered 20% prox-mid LAD, 30% more distal LAD, 50-60% stenosis of LAD towards apex, 20% Cx, 30% dRCA, focal 95% stenosis in smaller of 2 branches of PDA, EF 60-65%.  . Diabetes mellitus ([place])   . Diarrhea    takes Imodium daily as needed  . Diverticulosis   . Diverticulosis of colon (without mention of hemorrhage)   . DJD (degenerative joint disease)   . DVT (deep venous thrombosis) ([place]) 07/2011  . Esophageal stricture   . Essential hypertension    takes Imdur and Lisinopril daily  . Gastric polyps    benign  . GERD (gastroesophageal reflux disease)    takes Nexium daily  . Glaucoma    uses eye drops  . History of bronchitis    not sure when the last time  . History of colon polyps    benign  . History of kidney stones   . History of kidney stones   . Insomnia    takes Melatonin nightly as needed  . Mixed hyperlipidemia    takes Crestor daily  . Myasthenia gravis ([place])    uses prednisone  . Osteoporosis 2016  . Peripheral edema    takes Lasix daily  . Pituitary tumor    checks Dr[facility]Walden at [facility] checks it every Jan   . Pneumonia 01/2016  . PONV (postoperative nausea and vomiting)   . Ptosis   . Pulmonary embolus ([place]) 2012  . Stroke [facility]) 06/2015   Past Surgical History:       Past Surgical History:  Procedure Laterality Date  . ABDOMINAL AORTOGRAM W/LOWER EXTREMITY N/A 11/18/2016   Procedure: ABDOMINAL AORTOGRAM W/LOWER EXTREMITY;  Surgeon: [name], MD;  Location: [facility];  Service: Cardiovascular;  Laterality: N/A;  . BACK SURGERY     x2, lumbar and cervical  . CARDIAC CATHETERIZATION    . CATARACT EXTRACTION Bilateral 12/12  . COLONOSCOPY    . ESOPHAGOGASTRODUODENOSCOPY  ENDOSCOPY  04/2015  . IR GENERIC HISTORICAL  03/03/2016   IR IVC FILTER PLMT / S&I [name] GUID/MOD SED 03/03/2016 [name], MD [facility]  . IR GENERIC HISTORICAL  04/09/2016   IR RADIOLOGIST EVAL & MGMT 04/09/2016 [name], MD [facility]  . IR GENERIC HISTORICAL  04/30/2016   IR IVC FILTER RETRIEVAL / S&I [name] GUID/MOD SED 04/30/2016 [name], MD [facility]-INTER[facility] RAD  . LITHOTRIPSY    . LUMBAR LAMINECTOMY/DECOMPRESSION MICRODISCECTOMY Right 09/14/2012   Procedure: Right Lumbar three-four, four-five, Lumbar five-Sacral one decompressive laminectomy;  Surgeon: [name], MD;  Location: [facility];  Service: Neurosurgery;  Laterality: Right;  . LUMBAR LAMINECTOMY/DECOMPRESSION MICRODISCECTOMY Left 03/11/2016   Procedure: Left Lumbar Two-ThreeMicrodiscectomy;  Surgeon: [name], MD;  Location: [facility];  Service: Neurosurgery;  Laterality: Left;  . PERIPHERAL VASCULAR INTERVENTION  11/18/2016   Procedure: PERIPHERAL VASCULAR INTERVENTION;  Surgeon: [name], MD;  Location: [facility];  Service: Cardiovascular;;  Left popliteal  . SHOULDER  SURGERY Left 05/07/2009  . TONSILLECTOMY      Assessment & Plan Clinical Impression:  [name] is a 77 year old male with history of T2DM, MG, PE, PAD, CKD, CML, cerebellar stroke who was admitted on 07/19/17 with sudden onset of N/V and difficulty walking. CT head was negative for bleed but TPA not given as patient had taken Eliquis that a.m. 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 MRI brain was negative for acute intracranial process. And showed chronic moderate to advanced small vessel disease. 2D echo done showing EF of 50 to 55% with mild ABS. Dr. Leonie Man felt that patient had "TIA or recrudescence of previous stroke symptoms from failure of collateral circulation to occluded left vertebral artery  and should improve with time". He recommended continuing Eliquis, avoiding dehydration and hypotension. Patient continues to be limited by bilateral lower extremity weakness with ataxia and instability he has had decline in functional status that was CIR was recommended for follow-up therapy.  Patient transferred to CIR on 07/23/2017   Met with pt during co-treat with OT.  Brief leisure screen completed.  Pt stood to putt golf balls & retrieve golf balls from the floor (holes) with close supervision-contact guard assist.  Discussed discharge planning.  Pt stated he is most concerned about preventing another TIA or CVA.  Reviewed/educated pt on preventative measures and pt stated understanding.  Plan No further TR as pt is expected to discharge home 6/21  Recommendations for other services: None   Discharge Criteria: Patient will be discharged from TR if patient refuses treatment 3 consecutive times without medical reason.  If treatment goals not met, if there is a change in medical status, if patient makes no progress towards goals or if patient is discharged from hospital.  The above assessment, treatment plan, treatment alternatives and goals were discussed and mutually agreed upon: by patient  Micro 07/28/2017, 3:04 PM

## 2017-07-28 NOTE — Progress Notes (Signed)
Physical Therapy Session Note  Patient Details  Name: [name] MRN: [id number] Date of Birth: [birth year]  Today's Date: 07/28/2017 PT Individual Time: 1000-1100 PT Individual Time Calculation (min): 60 min   Short Term Goals: Week 1:  PT Short Term Goal 1 (Week 1): Pt will ambulate x 100 ft with LRAD and min A consistently PT Short Term Goal 2 (Week 1): Pt will ascend/descend 4 stairs with one handrail and min A PT Short Term Goal 3 (Week 1): Pt will perform bed to/from chair transfers safely and with SBA  Skilled Therapeutic Interventions/Progress Updates:    Pt received seated in w/c in room, agreeable to PT. No complaints of pain. Sit to stand with SBA. Ambulation to bathroom with CGA, toilet transfer close SBA. Pt is independent for 3/3 toileting steps. Ambulation x 300 ft with no AD and CGA, minor ataxia and decreased control of RLE. Ambulation with vertical and horizontal head turns with CGA, increase in ataxia with addition of head turns but no LOB or onset of dizziness. Side-steps 2 x 30 ft L/R with no UE support and CGA. Tandem ambulation with no UE support and min A for balance. Standing balance on Biodex: limits of stability level 1 with BUE support and CGA, v/c to weight shift with hips; catch game progressing from easy level to hard level progressing from BUE support to no UE support with CGA. Pt demos good performance of weight shift during game with no LOB. Ascend/descend 12 stairs with one handrail and min A, increased assist needed when descending stairs. Standing gastroc and soleus stretch 3 x 1 min each on blue wedge. Pt left seated in w/c in room with needs in reach, chair alarm and quick release belt in place.  Therapy Documentation Precautions:  Precautions Precautions: Fall Restrictions Weight Bearing Restrictions: No  See Function Navigator for Current Functional Status.   Therapy/Group: Individual Therapy  Excell Seltzer, PT, DPT  07/28/2017, 12:06 PM

## 2017-07-28 NOTE — Progress Notes (Signed)
Physical Therapy Session Note  Patient Details  Name: [name] MRN: [id number] Date of Birth: 1940/08/10  Today's Date: 07/28/2017 PT Individual Time: 1115-1200 PT Individual Time Calculation (min): 45 min   Short Term Goals: Week 1:  PT Short Term Goal 1 (Week 1): Pt will ambulate x 100 ft with LRAD and min A consistently PT Short Term Goal 2 (Week 1): Pt will ascend/descend 4 stairs with one handrail and min A PT Short Term Goal 3 (Week 1): Pt will perform bed to/from chair transfers safely and with SBA  Skilled Therapeutic Interventions/Progress Updates: Pt received seated in w/c with wife present, denies pain and agreeable to treatment. Gait no AD x175' with min guard. Performed nustep level 5 with BUE/BLE average >40 steps/min for total 8 min, one rest break at 4 min. Provided education regarding recommendation to participate in outpatient rehab for higher level balance deficits, and ongoing physical activity following d/c from outpatient therapy. Assessed DGI, FGA with results as below indicating moderate risk for falls. Performed four square step test x4 trials with best score of 9 sec. Returned to room with gait S. Discussed recommendations with wife for outpatient PT; both pt and wife agreeable. Remained in w/c at end of session, chair alarm and quick release belt intact, all needs in reach.      Therapy Documentation Precautions:  Precautions Precautions: Fall Restrictions Weight Bearing Restrictions: No Balance: Standardized Balance Assessment Standardized Balance Assessment: Functional Gait Assessment;Dynamic Gait Index Dynamic Gait Index Level Surface: Normal Change in Gait Speed: Normal Gait with Horizontal Head Turns: Normal Gait with Vertical Head Turns: Normal Gait and Pivot Turn: Mild Impairment Step Over Obstacle: Mild Impairment Step Around Obstacles: Normal Steps: Normal Total Score: 22  Functional Gait Assessment (FGA) Requirements: A marked 6-m (20-ft)  walkway that is marked with a 30.48-cm (12-in) width.  __3 1. GAIT LEVEL SURFACE Instructions: Walk at your normal speed from here to the next mark (6 m[20 ft]). Grading: [place]ta [place] the highest category that applies. (3) Normal-Walks 6 m (20 ft) in less than 5.5 seconds, no assistive devices, good speed, no evidence for imbalance, normal gait pattern, deviates no more than 15.24 cm (6 in) outside of the 30.48-cm (12-in) walkway width. (2) Mild impairment-Walks 6 m (20 ft) in less than 7 seconds but greater than 5.5 seconds, uses assistive device, slower speed, mild gait deviations, or deviates 15.24-25.4 cm (6-10 in) outside of the 30.48-cm (12-in) walkway width. (1) Moderate impairment-Walks 6 m (20 ft), slow speed, abnormal gait pattern, evidence for imbalance, or deviates 25.4-38.1 cm (10-15 in) outside of the 30.48-cm (12-in) walkway width. Requires more than 7 seconds to ambulate 6 m (20 ft). (0) Severe impairment-Cannot walk 6 m (20 ft) without assistance,severe gait deviations or imbalance, deviates greater than 38.1 cm (15 in) outside of the 30.48-cm (12-in) walkway width or reaches and touches the wall.  __3 2. CHANGE IN GAIT SPEED Instructions: Begin walking at your normal pace (for 1.5 m [5 ft]). When I tell you "go," walk as fast as you can (for 1.5 m [5 ft]). When I tell you "slow," walk as slowly as you can (for 1.5 m [5 ft]). Grading: [place] the highest category that applies. (3) Normal-Able to smoothly change walking speed without loss of balance or gait deviation. Shows a significant difference in walking speeds between normal, fast, and slow speeds. Deviates no more than 15.24 cm (6 in) outside of the 30.48-cm (12-in) walkway width. (2) Mild impairment-Is able to change  speed but demonstrates mild gait deviations, deviates 15.24-25.4 cm (6-10 in) outside of the 30.48-cm (12-in) walkway width, or no gait deviations but unable to achieve a significant change in velocity, or uses an assistive  device. (1) Moderate impairment-Makes only minor adjustments to walking speed, or accomplishes a change in speed with significant gait deviations, deviates 25.4-38.1 cm (10-15 in) outside the 30.48-cm (12-in) walkway width, or changes speed but loses balance but is able to recover and continue walking. (0) Severe impairment-Cannot change speeds, deviates greater than 38.1 cm (15 in) outside 30.48-cm (12-in) walkway width, or loses balance and has to reach for wall or be caught.  __3 3. GAIT WITH HORIZONTAL HEAD TURNS Instructions: Walk from here to the next mark 6 m (20 ft) away. Begin walking at your normal pace. Keep walking straight; after 3 steps, turn your head to the right and keep walking straight while looking to the right. After 3 more steps, turn your head to the left and keep walking straight while looking left. Continue alternating looking right and left every 3 steps until you have completed 2 repetitions in each direction. Grading: [place] the highest category that applies. (3) Normal-Performs head turns smoothly with no change in gait. Deviates no more than 15.24 cm (6 in) outside 30.48-cm (12-in) walkway width. (2) Mild impairment-Performs head turns smoothly with slight change in gait velocity (eg, minor disruption to smooth gait path), deviates 15.24-25.4 cm (6-10 in) outside 30.48-cm (12-in) walkway width, or uses an assistive device.  (1) Moderate impairment-Performs head turns with moderate change in gait velocity, slows down, deviates 25.4-38.1 cm (10-15 in) outside 30.48-cm (12-in) walkway width but recovers, can continue to walk. (0) Severe impairment-Performs task with severe disruption of gait (eg, staggers 38.1 cm [15 in] outside 30.48-cm (12-in) walkway width, loses balance, stops, or reaches for wall).  __3 4. GAIT WITH VERTICAL HEAD TURNS Instructions: Walk from here to the next mark (6 m [20 ft]). Begin walking at your normal pace. Keep walking straight; after 3 steps, tip  your head up and keep walking straight while looking up. After 3 more steps, tip your head down, keep walking straight while looking down. Continue alternating looking up and down every 3 steps until you have completed 2 repetitions in each direction. Grading: [place] the highest category that applies. (3) Normal-Performs head turns with no change in gait. Deviates no more than 15.24 cm (6 in) outside 30.48-cm (12-in) walkway width. (2) Mild impairment-Performs task with slight change in gait velocity (eg, minor disruption to smooth gait path), deviates 15.24-25.4 cm (6-10 in) outside 30.48-cm (12-in) walkway width or uses assistive device. (1) Moderate impairment-Performs task with moderate change in gait velocity, slows down, deviates 25.4-38.1 cm (10-15 in) outside 30.48-cm (12-in) walkway width but recovers, can continue to walk. (0) Severe impairment-Performs task with severe disruption of gait (eg, staggers 38.1 cm [15 in] outside 30.48-cm (12-in) walkway width, loses balance, stops, reaches for wall).  __2 5. GAIT AND PIVOT TURN Instructions: Begin with walking at your normal pace. When I tell you, "turn and stop," turn as quickly as you can to face the opposite direction and stop. Grading: [place] the highest category that applies. (3) Normal-Pivot turns safely within 3 seconds and stops quickly with no loss of balance. (2) Mild impairment-Pivot turns safely in _3 seconds and stops with no loss of balance, or pivot turns safely within 3 seconds and stops with mild imbalance, requires small steps to catch balance. (1) Moderate impairment-Turns slowly, requires verbal  cueing, or requires several small steps to catch balance following turn and stop. (0) Severe impairment-Cannot turn safely, requires assistance to turn and stop.  __2 6. STEP OVER OBSTACLE Instructions: Begin walking at your normal speed. When you come to the shoe box, step over it, not around it, and keep walking. Grading: [place] the  highest category that applies. (3) Normal-Is able to step over 2 stacked shoe boxes taped together (22.86 cm [9 in] total height) without changing gait speed; no evidence of imbalance. (2) Mild impairment-Is able to step over one shoe box (11.43 cm [4.5 in] total height) without changing gait speed; no evidence of imbalance. (1) Moderate impairment-Is able to step over one shoe box (11.43 cm [4.5 in] total height) but must slow down and adjust steps to clear box safely. May require verbal cueing. (0) Severe impairment-Cannot perform without assistance.  __1 7. GAIT WITH NARROW BASE OF SUPPORT Instructions: Walk on the floor with arms folded across the chest, feet aligned heel to toe in tandem for a distance of 3.6 m [12 ft]. The number of steps taken in a straight line are counted for a maximum of 10 steps. Grading: [place] the highest category that applies. (3) Normal-Is able to ambulate for 10 steps heel to toe with no staggering. (2) Mild impairment-Ambulates 7-9 steps. (1) Moderate impairment-Ambulates 4-7 steps. (0) Severe impairment-Ambulates less than 4 steps heel to toe or cannot perform without assistance.  __1 8. GAIT WITH EYES CLOSED Instructions: Walk at your normal speed from here to the next mark (6 m [20 ft]) with your eyes closed. Grading: [place] the highest category that applies. (3) Normal-Walks 6 m (20 ft), no assistive devices, good speed, no evidence of imbalance, normal gait pattern, deviates no more than 15.24 cm (6 in) outside 30.48-cm (12-in) walkway width. Ambulates 6 m (20 ft) in less than 7 seconds. (2) Mild impairment-Walks 6 m (20 ft), uses assistive device, slower speed, mild gait deviations, deviates 15.24-25.4 cm (6-10 in) outside 30.48-cm (12-in) walkway width. Ambulates 6 m (20 ft) in less than 9 seconds but greater than 7 seconds. (1) Moderate impairment-Walks 6 m (20 ft), slow speed, abnormal gait pattern, evidence for imbalance, deviates 25.4-38.1 cm (10-15 in)  outside 30.48-cm (12-in) walkway width. Requires more than 9 seconds to ambulate 6 m (20 ft). (0) Severe impairment-Cannot walk 6 m (20 ft) without assistance, severe gait deviations or imbalance, deviates greater than 38.1 cm (15 in) outside 30.48-cm (12-in) walkway width or will not attempt task.  _2_ 9. AMBULATING BACKWARDS Instructions: Walk backwards until I tell you to stop. Grading: [place] the highest category that applies. (3) Normal-Walks 6 m (20 ft), no assistive devices, good speed, no evidence for imbalance, normal gait pattern, deviates no more than 15.24 cm (6 in) outside 30.48-cm (12-in) walkway width. (2) Mild impairment-Walks 6 m (20 ft), uses assistive device, slower speed, mild gait deviations, deviates 15.24-25.4 cm (6-10 in) outside 30.48-cm (12-in) walkway width. (1) Moderate impairment-Walks 6 m (20 ft), slow speed, abnormal gait pattern, evidence for imbalance, deviates 25.4-38.1 cm (10-15 in) outside 30.48-cm (12-in) walkway width. (0) Severe impairment-Cannot walk 6 m (20 ft) without assistance, severe gait deviations or imbalance, deviates greater than 38.1 cm (15 in) outside 30.48-cm (12-in) walkway width or will not attempt task.  __2 10. STEPS Instructions: Walk up these stairs as you would at home (ie, using the rail if necessary). At the top turn around and walk down. Grading: Elta [place] the highest category that applies. (3)  Normal-Alternating feet, no rail. (2) Mild impairment-Alternating feet, must use rail. (1) Moderate impairment-Two feet to a stair; must use rail. (0) Severe impairment-Cannot do safely.  TOTAL SCORE: ____22__ /30 (MAXIMUM SCORE=30)  Scores of ? 22/30 on the FGA were found to be effective in predicting falls, Sensitivity 85%, Specificity 86% Scores of ? 20/30 on the FGA were optimal to predict older adults residing in community dwellings who would sustain unexplained falls in the next 6 months, Sensitivity 100%, Specificity 76% ([place],  2010; aged 53 to 108, Older Adults) [place]: 4.2 points for CVA Augustin Coupe et al, 2010) MCID: 8 points for Balance and Vestibular Disorders Marjorie Smolder and Augustin Coupe, 2014)   See Function Navigator for Current Functional Status.   Therapy/Group: Individual Therapy  Luberta Mutter 07/28/2017, 12:07 PM

## 2017-07-28 NOTE — Plan of Care (Signed)
  Problem: RH Bed to Chair Transfers Goal: LTG Patient will perform bed/chair transfers w/assist (PT) Description LTG: Patient will perform bed to chair transfers with assistance (PT). Flowsheets (Taken 07/28/2017 1208) LTG: Pt will perform Bed to Chair Transfers with assistance level  : Independent (upgraded d/t progress) Note:  Upgraded d/t progress   Problem: RH Ambulation Goal: LTG Patient will ambulate in controlled environment (PT) Description LTG: Patient will ambulate in a controlled environment, # of feet with assistance (PT). Flowsheets (Taken 07/28/2017 1208) LTG: Pt will ambulate in controlled environ  assist needed:: Independent (upgraded d/t progress) LTG: Ambulation distance in controlled environment: 150' Note:  Upgraded d/t progress

## 2017-07-28 NOTE — Plan of Care (Signed)
  Problem: RH Ambulation Goal: LTG Patient will ambulate in home environment (PT) Description LTG: Patient will ambulate in home environment, # of feet with assistance (PT). Flowsheets (Taken 07/28/2017 1209) LTG: Pt will ambulate in home environ  assist needed:: Independent LTG: Ambulation distance in home environment: 150' Note:  Goal added d/t progress   Problem: RH Ambulation Goal: LTG Patient will ambulate in community environment (PT) Description LTG: Patient will ambulate in community environment, # of feet with assistance (PT). Flowsheets (Taken 07/28/2017 1209) LTG: Pt will ambulate in community environ  assist needed:: Supervision/Verbal cueing LTG: Ambulation distance in community environment: 150' Note:  Goal added d/t progress

## 2017-07-28 NOTE — Progress Notes (Signed)
Subjective/Complaints:   ROS neg CP, SOB, NVD, freq stools , no abd pain Objective: Vital Signs: Blood pressure (!) 154/70, pulse 78, temperature 97.6 F (36.4 C), temperature source Oral, resp. rate 18, weight (P) 80.2 kg (176 lb 12.9 oz), SpO2 98 %. No results found. Results for orders placed or performed during the hospital encounter of 07/23/17 (from the past 72 hour(s))  Glucose, capillary     Status: Abnormal   Collection Time: 07/25/17 11:37 AM  Result Value Ref Range   Glucose-Capillary 142 (H) 65 - 99 mg/dL  Glucose, capillary     Status: Abnormal   Collection Time: 07/25/17  4:44 PM  Result Value Ref Range   Glucose-Capillary 117 (H) 65 - 99 mg/dL  Glucose, capillary     Status: Abnormal   Collection Time: 07/25/17  9:13 PM  Result Value Ref Range   Glucose-Capillary 163 (H) 65 - 99 mg/dL  Glucose, capillary     Status: None   Collection Time: 07/26/17  6:44 AM  Result Value Ref Range   Glucose-Capillary 93 65 - 99 mg/dL  Glucose, capillary     Status: Abnormal   Collection Time: 07/26/17 11:49 AM  Result Value Ref Range   Glucose-Capillary 106 (H) 65 - 99 mg/dL  Glucose, capillary     Status: Abnormal   Collection Time: 07/26/17  5:05 PM  Result Value Ref Range   Glucose-Capillary 149 (H) 65 - 99 mg/dL  Glucose, capillary     Status: Abnormal   Collection Time: 07/26/17  9:18 PM  Result Value Ref Range   Glucose-Capillary 120 (H) 65 - 99 mg/dL  Glucose, capillary     Status: None   Collection Time: 07/27/17  6:09 AM  Result Value Ref Range   Glucose-Capillary 98 65 - 99 mg/dL  Glucose, capillary     Status: Abnormal   Collection Time: 07/27/17 11:40 AM  Result Value Ref Range   Glucose-Capillary 111 (H) 65 - 99 mg/dL  Glucose, capillary     Status: Abnormal   Collection Time: 07/27/17  5:06 PM  Result Value Ref Range   Glucose-Capillary 200 (H) 65 - 99 mg/dL  Glucose, capillary     Status: Abnormal   Collection Time: 07/27/17  9:25 PM  Result Value Ref  Range   Glucose-Capillary 153 (H) 65 - 99 mg/dL  Glucose, capillary     Status: Abnormal   Collection Time: 07/28/17  6:30 AM  Result Value Ref Range   Glucose-Capillary 106 (H) 65 - 99 mg/dL     HEENT: Eyes mildly injected Cardio: RRR and no murmur Resp: CTA B/L and Unlabored GI: BS positive and NT, ND Extremity:  Pulses positive and No Edema Skin:   Intact Neuro: Alert/Oriented, Normal Sensory and Abnormal Motor 4+ RUE adn RLE, 5/5 left side UE adn LE Musc/Skel:  Normal and Other no pain with UE or LE AROM Gen NAD   Assessment/Plan: 1. Functional deficits secondary to Right hemiparesis left subcortical infarct which require 3+ hours per day of interdisciplinary therapy in a comprehensive inpatient rehab setting. Physiatrist is providing close team supervision and 24 hour management of active medical problems listed below. Physiatrist and rehab team continue to assess barriers to discharge/monitor patient progress toward functional and medical goals. FIM: Function - Bathing Position: Shower Body parts bathed by patient: Right arm, Right lower leg, Left arm, Left lower leg, Abdomen, Front perineal area, Buttocks, Right upper leg, Left upper leg, Chest Bathing not applicable: Back Assist Level: Touching  or steadying assistance(Pt > 75%)  Function- Upper Body Dressing/Undressing What is the patient wearing?: Pull over shirt/dress Pull over shirt/dress - Perfomed by patient: Thread/unthread right sleeve, Thread/unthread left sleeve, Put head through opening, Pull shirt over trunk Assist Level: Supervision or verbal cues Function - Lower Body Dressing/Undressing What is the patient wearing?: Pants, Socks, Shoes, Underwear Position: Wheelchair/chair at [employer] - Performed by patient: Thread/unthread right underwear leg, Thread/unthread left underwear leg, Pull underwear up/down Pants- Performed by patient: Thread/unthread right pants leg, Thread/unthread left pants leg, Pull  pants up/down Non-skid slipper socks- Performed by patient: Don/doff left sock Socks - Performed by patient: Don/doff left sock, Don/doff right sock Shoes - Performed by patient: Don/doff right shoe, Don/doff left shoe, Fasten right, Fasten left Shoes - Performed by helper: Fasten right, Fasten left Assist for footwear: Supervision/touching assist Assist for lower body dressing: Supervision or verbal cues  Function - Toileting Toileting steps completed by patient: Adjust clothing prior to toileting, Performs perineal hygiene, Adjust clothing after toileting Toileting steps completed by helper: Adjust clothing prior to toileting, Adjust clothing after toileting Toileting Assistive Devices: Grab bar or rail Assist level: Touching or steadying assistance (Pt.75%)  Function - Toilet Transfers Toilet transfer assistive device: Grab bar Assist level to toilet: Touching or steadying assistance (Pt > 75%) Assist level from toilet: Touching or steadying assistance (Pt > 75%)  Function - Chair/bed transfer Chair/bed transfer method: Ambulatory Chair/bed transfer assist level: Touching or steadying assistance (Pt > 75%) Chair/bed transfer details: Verbal cues for sequencing, Verbal cues for technique, Verbal cues for precautions/safety  Function - Locomotion: Wheelchair Will patient use wheelchair at discharge?: No Max wheelchair distance: 100 Assist Level: Supervision or verbal cues Assist Level: Supervision or verbal cues Turns around,maneuvers to table,bed, and toilet,negotiates 3% grade,maneuvers on rugs and over doorsills: No Function - Locomotion: Ambulation Assistive device: No device Max distance: 300 Assist level: Touching or steadying assistance (Pt > 75%) Assist level: Touching or steadying assistance (Pt > 75%) Assist level: Touching or steadying assistance (Pt > 75%) Walk 150 feet activity did not occur: Safety/medical concerns Assist level: Touching or steadying assistance (Pt  > 75%) Walk 10 feet on uneven surfaces activity did not occur: Safety/medical concerns Assist level: Touching or steadying assistance (Pt > 75%)  Function - Comprehension Comprehension: Auditory Comprehension assistive device: Hearing aids Comprehension assist level: Follows complex conversation/direction with extra time/assistive device  Function - Expression Expression: Verbal Expression assist level: Expresses complex ideas: With extra time/assistive device  Function - Social Interaction Social Interaction assist level: Interacts appropriately 90% of the time - Needs monitoring or encouragement for participation or interaction.  Function - Problem Solving Problem solving assist level: Solves basic 90% of the time/requires cueing < 10% of the time  Function - Memory Memory assist level: Recognizes or recalls 90% of the time/requires cueing < 10% of the time Patient normally able to recall (first 3 days only): Current season, Location of own room, Staff names and faces, That he or she is in a hospital  Medical Problem List and Plan:  1. Right hemiparesis and functional deficits secondary to ?left subcortical infarct, hx of myasthenia gravis.  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 Formulized individual treatment plan based on medical history, underlying problem and comorbidities. 2. DVT Prophylaxis/Anticoagulation: Pharmaceutical: Other (comment)--Eliquis  3.  Pain Management: tylenol prn.  4. Mood: LCSW to follow for evaluation and support.  5. Neuropsych: This patient is capable of making decisions on his own behalf.  6. Skin/Wound Care: Routine pressure relief measures.  7. Fluids/Electrolytes/Nutrition: Monitor I/O. Check lytes in am.  8. MG: On Mestinon and  prednisone.  9. 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 GERD: Resume Zantac bid.  12. HTN: Monitor BP bid--continue Imdur.  Vitals:   07/27/17 2030 07/28/17 0521  BP: 139/73 (!) 154/70  Pulse: 81 78  Resp: 18 18  Temp: 98.9 F (37.2 C) 97.6 F (36.4 C)  SpO2: 98% 98%  Fair control 6/19 13. CAD : Monitor for symptoms with activity. On Imdur and Crestor.  14. Glaucoma: Managed with Timolol and Alphagan bid.  -on tobrex eye gtts recently for recent infection  -steroid gtts for inflammation (already receiving prednisone PO but low dose)   15.  CKD 3  LOS (Days) 5 A FACE TO FACE EVALUATION WAS PERFORMED  Charlett Blake 07/28/2017, 7:47 AM

## 2017-07-28 NOTE — Plan of Care (Signed)
  Problem: RH SAFETY Goal: RH STG ADHERE TO SAFETY PRECAUTIONS W/ASSISTANCE/DEVICE Description STG Adhere to Safety Precautions With min Assistance/Device.  Outcome: Progressing  Proper footwear, call light at hand, safety belt.

## 2017-07-28 NOTE — Progress Notes (Signed)
Occupational Therapy Session Note  Patient Details  Name: [name] MRN: [id number] Date of Birth: [birth year]  Today's Date: 07/28/2017 OT Individual Time: 1300-1430 OT Individual Time Calculation (min): 90 min    Short Term Goals: Week 1:  OT Short Term Goal 1 (Week 1): Pt will ambulate into bathroom with no more than min A OT Short Term Goal 2 (Week 1): Pt will maintain standing balance within BADL task with no more than CGA OT Short Term Goal 3 (Week 1): Pt will demonstrate improved safety awareness by locking wc breaks prior to standing without verbal cues  Skilled Therapeutic Interventions/Progress Updates:    Upon entering the room, pt seated in wheelchair with no c/o pain and wife present in the room. Pt requesting to shower this session. Pt ambulating to obtain all needed items without use of RW and steady assistance. Pt initially standing in shower with min verbal cues for safety awareness to sit for LB bathing. Pt seated on EOB to don clothing items with overall supervision. Pt standing at sink for grooming tasks at overall close supervision as well for balance. Pt ambulating 150' to [facility] gym with steady assistance and without use of AD. Pt engaged in dynamic standing tasks with dynavision board. Pt initially standing for 3 minutes and then second attempt for 3 minutes while on airex foam. Pt with multiple posterior leans requiring steady assistance for balance for safety. On pt's second attempt, average reaction time of 1.21 seconds with each quadrants average times being very close together. Pt able to hit 149 targets within 3 minutes of standing. Pt taking seated rest break and OT providing pt with information regarding stroke risk and symptoms secondary to increased stroke risk at this time. Pt verbalized understanding and asking several appropriate questions. Recreational therapist joined session and set up putting green which is a leisure interest of this patient. Pt with multiple  LOB posteriorly while standing to hit target with gold club requiring min A to correct. Pt ambulating to pick up balls from ground with min A for safety with balance. Pt ambulating back to room at end of session with call bell and all needed items within reach.   Therapy Documentation Precautions:  Precautions Precautions: Fall Restrictions Weight Bearing Restrictions: No Vital Signs: Therapy Vitals Temp: 98.2 F (36.8 C) Temp Source: Oral Pulse Rate: 81 Resp: 16 BP: (!) 143/84 Patient Position (if appropriate): Sitting Oxygen Therapy SpO2: 98 % O2 Device: Room Air ADL: ADL ADL Comments: Please see functional navigator  See Function Navigator for Current Functional Status.   Therapy/Group: Individual Therapy  Gypsy Decant 07/28/2017, 3:54 PM

## 2017-07-29 ENCOUNTER — Inpatient Hospital Stay (HOSPITAL_COMMUNITY): Payer: Medicare Other | Admitting: Occupational Therapy

## 2017-07-29 ENCOUNTER — Inpatient Hospital Stay (HOSPITAL_COMMUNITY): Payer: Medicare Other | Admitting: Physical Therapy

## 2017-07-29 LAB — GLUCOSE, CAPILLARY
GLUCOSE-CAPILLARY: 81 mg/dL (ref 65–99)
GLUCOSE-CAPILLARY: 119 mg/dL — AB (ref 65–99)
Glucose-Capillary: 135 mg/dL — ABNORMAL HIGH (ref 65–99)
Glucose-Capillary: 145 mg/dL — ABNORMAL HIGH (ref 65–99)

## 2017-07-29 NOTE — Progress Notes (Signed)
Subjective/Complaints:  Pt pleased about d/c date  ROS neg CP, SOB, NVD, freq stools , no abd pain Objective: Vital Signs: Blood pressure 128/62, pulse 68, temperature 98.2 F (36.8 C), temperature source Oral, resp. rate 18, weight 81.7 kg (180 lb 1.9 oz), SpO2 97 %. No results found. Results for orders placed or performed during the hospital encounter of 07/23/17 (from the past 72 hour(s))  Glucose, capillary     Status: Abnormal   Collection Time: 07/26/17 11:49 AM  Result Value Ref Range   Glucose-Capillary 106 (H) 65 - 99 mg/dL  Glucose, capillary     Status: Abnormal   Collection Time: 07/26/17  5:05 PM  Result Value Ref Range   Glucose-Capillary 149 (H) 65 - 99 mg/dL  Glucose, capillary     Status: Abnormal   Collection Time: 07/26/17  9:18 PM  Result Value Ref Range   Glucose-Capillary 120 (H) 65 - 99 mg/dL  Glucose, capillary     Status: None   Collection Time: 07/27/17  6:09 AM  Result Value Ref Range   Glucose-Capillary 98 65 - 99 mg/dL  Glucose, capillary     Status: Abnormal   Collection Time: 07/27/17 11:40 AM  Result Value Ref Range   Glucose-Capillary 111 (H) 65 - 99 mg/dL  Glucose, capillary     Status: Abnormal   Collection Time: 07/27/17  5:06 PM  Result Value Ref Range   Glucose-Capillary 200 (H) 65 - 99 mg/dL  Glucose, capillary     Status: Abnormal   Collection Time: 07/27/17  9:25 PM  Result Value Ref Range   Glucose-Capillary 153 (H) 65 - 99 mg/dL  Glucose, capillary     Status: Abnormal   Collection Time: 07/28/17  6:30 AM  Result Value Ref Range   Glucose-Capillary 106 (H) 65 - 99 mg/dL  Basic metabolic panel     Status: Abnormal   Collection Time: 07/28/17  7:26 AM  Result Value Ref Range   Sodium 142 135 - 145 mmol/L   Potassium 3.7 3.5 - 5.1 mmol/L   Chloride 109 101 - 111 mmol/L   CO2 27 22 - 32 mmol/L   Glucose, Bld 112 (H) 65 - 99 mg/dL   BUN 14 6 - 20 mg/dL   Creatinine, Ser 1.63 (H) 0.61 - 1.24 mg/dL   Calcium 8.8 (L) 8.9 - 10.3  mg/dL   GFR calc non Af Amer 38 (L) >60 mL/min   GFR calc Af Amer 45 (L) >60 mL/min    Comment: (NOTE) The eGFR has been calculated using the CKD EPI equation. This calculation has not been validated in all clinical situations. eGFR's persistently <60 mL/min signify possible Chronic Kidney Disease.    Anion gap 6 5 - 15    Comment: Performed at [facility] [address] [place], [name] [address]  Glucose, capillary     Status: Abnormal   Collection Time: 07/28/17 12:09 PM  Result Value Ref Range   Glucose-Capillary 101 (H) 65 - 99 mg/dL  Glucose, capillary     Status: Abnormal   Collection Time: 07/28/17  4:25 PM  Result Value Ref Range   Glucose-Capillary 138 (H) 65 - 99 mg/dL  Glucose, capillary     Status: Abnormal   Collection Time: 07/28/17  8:52 PM  Result Value Ref Range   Glucose-Capillary 188 (H) 65 - 99 mg/dL  Glucose, capillary     Status: None   Collection Time: 07/29/17  6:44 AM  Result Value Ref  Range   Glucose-Capillary 81 65 - 99 mg/dL     HEENT: Eyes mildly injected Cardio: RRR and no murmur Resp: CTA B/L and Unlabored GI: BS positive and NT, ND Extremity:  Pulses positive and No Edema Skin:   Intact Neuro: Alert/Oriented, Normal Sensory and Abnormal Motor 4+ RUE adn RLE, 5/5 left side UE adn LE Musc/Skel:  Normal and Other no pain with UE or LE AROM Gen NAD   Assessment/Plan: 1. Functional deficits secondary to Right hemiparesis left subcortical infarct which require 3+ hours per day of interdisciplinary therapy in a comprehensive inpatient rehab setting. Physiatrist is providing close team supervision and 24 hour management of active medical problems listed below. Physiatrist and rehab team continue to assess barriers to discharge/monitor patient progress toward functional and medical goals. FIM: Function - Bathing Position: Shower Body parts bathed by patient: Right arm, Right lower leg, Left arm, Left lower leg, Abdomen, Front perineal  area, Buttocks, Right upper leg, Left upper leg, Chest Bathing not applicable: Back Assist Level: Touching or steadying assistance(Pt > 75%)  Function- Upper Body Dressing/Undressing What is the patient wearing?: Pull over shirt/dress Pull over shirt/dress - Perfomed by patient: Thread/unthread right sleeve, Thread/unthread left sleeve, Put head through opening, Pull shirt over trunk Assist Level: Supervision or verbal cues Function - Lower Body Dressing/Undressing What is the patient wearing?: Pants, Socks, Shoes, Underwear Position: Sitting EOB Underwear - Performed by patient: Thread/unthread right underwear leg, Thread/unthread left underwear leg, Pull underwear up/down Pants- Performed by patient: Thread/unthread right pants leg, Thread/unthread left pants leg, Pull pants up/down Non-skid slipper socks- Performed by patient: Don/doff left sock Socks - Performed by patient: Don/doff left sock, Don/doff right sock Shoes - Performed by patient: Don/doff right shoe, Don/doff left shoe, Fasten right, Fasten left Shoes - Performed by helper: Fasten right, Fasten left Assist for footwear: Supervision/touching assist Assist for lower body dressing: Supervision or verbal cues  Function - Toileting Toileting steps completed by patient: Adjust clothing prior to toileting, Performs perineal hygiene, Adjust clothing after toileting Toileting steps completed by helper: Adjust clothing prior to toileting, Adjust clothing after toileting Toileting Assistive Devices: Grab bar or rail Assist level: Supervision or verbal cues  Function - Toilet Transfers Toilet transfer assistive device: Grab bar Assist level to toilet: Supervision or verbal cues Assist level from toilet: Supervision or verbal cues  Function - Chair/bed transfer Chair/bed transfer method: Ambulatory Chair/bed transfer assist level: Touching or steadying assistance (Pt > 75%) Chair/bed transfer details: Verbal cues for sequencing,  Verbal cues for technique, Verbal cues for precautions/safety  Function - Locomotion: Wheelchair Will patient use wheelchair at discharge?: No Max wheelchair distance: 100 Assist Level: Supervision or verbal cues Assist Level: Supervision or verbal cues Turns around,maneuvers to table,bed, and toilet,negotiates 3% grade,maneuvers on rugs and over doorsills: No Function - Locomotion: Ambulation Assistive device: No device Max distance: 300' Assist level: Touching or steadying assistance (Pt > 75%) Assist level: Touching or steadying assistance (Pt > 75%) Assist level: Touching or steadying assistance (Pt > 75%) Walk 150 feet activity did not occur: Safety/medical concerns Assist level: Touching or steadying assistance (Pt > 75%) Walk 10 feet on uneven surfaces activity did not occur: Safety/medical concerns Assist level: Touching or steadying assistance (Pt > 75%)  Function - Comprehension Comprehension: Auditory Comprehension assistive device: Hearing aids Comprehension assist level: Follows complex conversation/direction with extra time/assistive device  Function - Expression Expression: Verbal Expression assist level: Expresses complex ideas: With extra time/assistive device  Function - Social Interaction  Social Interaction assist level: Interacts appropriately 90% of the time - Needs monitoring or encouragement for participation or interaction.  Function - Problem Solving Problem solving assist level: Solves basic 90% of the time/requires cueing < 10% of the time  Function - Memory Memory assist level: Recognizes or recalls 90% of the time/requires cueing < 10% of the time Patient normally able to recall (first 3 days only): Current season, Location of own room, Staff names and faces, That he or she is in a hospital  Medical Problem List and Plan:  1. Right hemiparesis and functional deficits secondary to ?left subcortical infarct, hx of myasthenia gravis.  Prior hx of Left  PICA and Left cerebellar vermis infarct with minor residual deficit 2017 -[facility] PT, OT, SLP-plan d/c at Mod I level in am2. DVT Prophylaxis/Anticoagulation: Pharmaceutical: Other (comment)--Eliquis  3. Pain Management: tylenol prn.  4. Mood: LCSW to follow for evaluation and support.  5. Neuropsych: This patient is capable of making decisions on his own behalf.  6. Skin/Wound Care: Routine pressure relief measures.  7. Fluids/Electrolytes/Nutrition: Monitor I/O. Check lytes in am.  8. MG: On Mestinon and prednisone.  9. 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 GERD: Resume Zantac bid.  12. HTN: Monitor BP bid--continue Imdur.  Vitals:   07/28/17 1936 07/29/17 0439  BP: (!) 154/67 128/62  Pulse: 77 68  Resp: 18 18  Temp: 98.4 F (36.9 C) 98.2 F (36.8 C)  SpO2: 100% 97%  Fair control 6/20 13. CAD : Monitor for symptoms with activity. On Imdur and Crestor.  14. Glaucoma: Managed with Timolol and Alphagan bid.  -on tobrex eye gtts recently for recent infection  -steroid gtts for inflammation (already receiving prednisone PO but low dose)   15.  CKD 3 baseline creat ~1.6  LOS (Days) 6 A FACE TO FACE EVALUATION WAS PERFORMED  Charlett Blake 07/29/2017, 7:14 AM

## 2017-07-29 NOTE — Progress Notes (Signed)
Occupational Therapy Discharge Summary  Patient Details  Name: [name] MRN: [id number] Date of Birth: [birth year]  Today's Date: 07/29/2017 OT Individual Time: 1300-1358 OT Individual Time Calculation (min): 58 min    Session Note:  Pt ambulated to the therapy gym with modified independence for session.  Once in the gym, had pt work on BUE therex exercises with handout provided for reference.  He completed 1 set of 10-15 repetitions for shoulder flexion, horizontal abduction, seated row, elbow flexion, and elbow extension.  Light resistance light orange and dark orange bands used for exercises.  Had pt also work on therapy putty exercises for increased right hand strength with handout provided as well.  Exercises focusing on gross grasp, pinching tip to tip,  Finished session with work on dynamic standing balance.  Had pt perform toe taps alternating to 5" step.  Also had him complete functional mobility with head turns side to side as well as with quick turns to the left and right.  No LOB noted.  Pt left in the wheelchair at end of session with spouse present.  Call button and phone in reach.   Patient has met 11 of 11 long term goals due to improved activity tolerance, improved balance, functional use of  RIGHT upper extremity and improved coordination.  Patient to discharge at overall Modified Independent level.  Patient's care partner is independent to provide the necessary physical assistance at discharge.    Reasons goals not met: NA  Recommendation:  Feel pt does not warrant further OT needs at this time.  Wife will provide 24 hour supervision at home and has been instructed on areas that pt may need cueing for safety with regards to standing during LB selfcare tasks.    Equipment: No equipment provided  Reasons for discharge: treatment goals met and discharge from hospital  Patient/family agrees with progress made and goals achieved: Yes  OT Discharge Precautions/Restrictions   Precautions Precautions: Fall Restrictions Weight Bearing Restrictions: No  Pain Pain Assessment Pain Score: 0-No pain ADL ADL ADL Comments: Please see functional navigator Vision Baseline Vision/History: No visual deficits Patient Visual Report: No change from baseline Vision Assessment?: No apparent visual deficits Perception  Perception: Within Functional Limits Praxis Praxis: Intact Cognition Overall Cognitive Status: History of cognitive impairments - at baseline Arousal/Alertness: Awake/alert Orientation Level: Oriented X4 Attention: Selective Selective Attention: Appears intact Alternating Attention: Appears intact Memory: Appears intact Awareness: Appears intact Problem Solving: Appears intact Safety/Judgment: Impaired Comments: Pt still with decreased anticipatory awareness related to selfcare tasks.  Needs cueing to recall the need to sit down with LB selfcare tasks.   Sensation Sensation Light Touch: Appears Intact Hot/Cold: Appears Intact Proprioception: Appears Intact Coordination Gross Motor Movements are Fluid and Coordinated: No Fine Motor Movements are Fluid and Coordinated: No Motor  Motor Motor - Discharge Observations: mild R hemiparesis, mild ataxia R extremities Mobility  Bed Mobility Bed Mobility: Supine to Sit;Sit to Supine Supine to Sit: Independent Sit to Supine: Independent Transfers Sit to Stand: Independent Stand to Sit: Independent  Trunk/Postural Assessment  Cervical Assessment Cervical Assessment: Within Functional Limits Thoracic Assessment Thoracic Assessment: Within Functional Limits Lumbar Assessment Lumbar Assessment: Within Functional Limits  Balance Balance Balance Assessed: Yes Standardized Balance Assessment Standardized Balance Assessment: Berg Balance Test;Timed Up and Go Test;Functional Gait Assessment;Dynamic Gait Index Berg Balance Test Sit to Stand: Able to stand without using hands and stabilize  independently Standing Unsupported: Able to stand safely 2 minutes Sitting with Back Unsupported but Feet Supported  on Floor or Stool: Able to sit safely and securely 2 minutes Stand to Sit: Sits safely with minimal use of hands Transfers: Able to transfer safely, minor use of hands Standing Unsupported with Eyes Closed: Able to stand 10 seconds with supervision Standing Ubsupported with Feet Together: Able to place feet together independently and stand for 1 minute with supervision From Standing, Reach Forward with Outstretched Arm: Can reach confidently >25 cm (10") From Standing Position, Pick up Object from Floor: Able to pick up shoe safely and easily From Standing Position, Turn to Look Behind Over each Shoulder: Looks behind one side only/other side shows less weight shift Turn 360 Degrees: Able to turn 360 degrees safely but slowly Standing Unsupported, Alternately Place Feet on Step/Stool: Able to stand independently and safely and complete 8 steps in 20 seconds Standing Unsupported, One Foot in Front: Able to plae foot ahead of the other independently and hold 30 seconds Standing on One Leg: Able to lift leg independently and hold equal to or more than 3 seconds Total Score: 48 Dynamic Gait Index Level Surface: Normal Change in Gait Speed: Normal Gait with Horizontal Head Turns: Normal Gait with Vertical Head Turns: Normal Gait and Pivot Turn: Mild Impairment Step Over Obstacle: Mild Impairment Step Around Obstacles: Normal Steps: Normal Total Score: 22 Timed Up and Go Test TUG: Normal TUG;Cognitive TUG Normal TUG (seconds): 7 Cognitive TUG (seconds): 10 Static Sitting Balance Static Sitting - Level of Assistance: 7: Independent Dynamic Sitting Balance Dynamic Sitting - Balance Support: Feet supported Dynamic Sitting - Level of Assistance: 7: Independent Static Standing Balance Static Standing - Balance Support: During functional activity Static Standing - Level of  Assistance: 7: Independent Dynamic Standing Balance Dynamic Standing - Balance Support: During functional activity;Bilateral upper extremity supported Dynamic Standing - Level of Assistance: 7: Independent Extremity/Trunk Assessment RUE Assessment RUE Assessment: Exceptions to [facility] General Strength Comments: AROM WFLs for all joints.  Strength throughout 3+/5.  Very minimal ataxia noted.   LUE Assessment LUE Assessment: Exceptions to [facility] General Strength Comments: AROM WFLS for all joints, strength 3+/5 in the shoulder and grip, 4/5 in the left elbow    See Function Navigator for Current Functional Status.  Oliveah Zwack OTR/L 07/29/2017, 3:48 PM

## 2017-07-29 NOTE — Progress Notes (Signed)
Physical Therapy Discharge Summary  Patient Details  Name: [name] MRN: [id number] Date of Birth: 30-Jan-1941  Today's Date: 07/29/2017 PT Individual Time: 1415-1530 PT Individual Time Calculation (min): 75 min    Patient has met 7 of 7 long term goals due to improved activity tolerance, improved balance, improved postural control, increased strength, ability to compensate for deficits, functional use of  right upper extremity and right lower extremity, improved attention, improved awareness and improved coordination.  Patient to discharge at an ambulatory level Supervision.   Patient's care partner is independent to provide the necessary physical assistance at discharge.  Reasons goals not met: All goals met  Recommendation:  Patient will benefit from ongoing skilled PT services in outpatient setting to continue to advance safe functional mobility, address ongoing impairments in balance, activity tolerance, strength, and minimize fall risk.  Equipment: No equipment provided  Reasons for discharge: treatment goals met and discharge from hospital  Patient/family agrees with progress made and goals achieved: Yes  PT Discharge Precautions/Restrictions Precautions Precautions: Fall Restrictions Weight Bearing Restrictions: No Pain Pain Assessment Pain Score: 0-No pain Vision/Perception  Perception Perception: Within Functional Limits Praxis Praxis: Intact  Cognition Overall Cognitive Status: History of cognitive impairments - at baseline Arousal/Alertness: Awake/alert Orientation Level: Oriented X4 Attention: Selective Selective Attention: Appears intact Alternating Attention: Appears intact Memory: Appears intact Awareness: Appears intact Problem Solving: Appears intact Safety/Judgment: Impaired Comments: Pt still with decreased anticipatory awareness related to selfcare tasks.  Needs cueing to recall the need to sit down with LB selfcare tasks.    Sensation Sensation Light Touch: Appears Intact Hot/Cold: Appears Intact Proprioception: Appears Intact Coordination Gross Motor Movements are Fluid and Coordinated: No Fine Motor Movements are Fluid and Coordinated: No Motor  Motor Motor - Discharge Observations: mild R hemiparesis, mild ataxia R extremities  Mobility Bed Mobility Bed Mobility: Supine to Sit;Sit to Supine Supine to Sit: Independent Sit to Supine: Independent Transfers Sit to Stand: Independent Stand to Sit: Independent Stand Pivot Transfers: Independent Locomotion  Gait Ambulation: Yes Gait Assistance: Independent Gait Distance (Feet): 300 Feet Assistive device: None Gait Gait: Yes Gait Pattern: Impaired Gait Pattern: Poor foot clearance - right;Poor foot clearance - left Gait velocity: 3.75 ft/sec High Level Ambulation High Level Ambulation: (defer to FGA, DGI) Stairs / Additional Locomotion Stairs: Yes Stairs Assistance: Supervision/Verbal cueing Stair Management Technique: Alternating pattern;Forwards;One rail Right Number of Stairs: 12 Height of Stairs: 6 Ramp: Supervision/Verbal cueing Curb: Supervision/Verbal cueing Wheelchair Mobility Wheelchair Mobility: No  Trunk/Postural Assessment  Cervical Assessment Cervical Assessment: Within Functional Limits Thoracic Assessment Thoracic Assessment: Within Functional Limits Lumbar Assessment Lumbar Assessment: Within Functional Limits  Balance Balance Balance Assessed: Yes Standardized Balance Assessment Standardized Balance Assessment: Berg Balance Test;Timed Up and Go Test;Functional Gait Assessment;Dynamic Gait Index Berg Balance Test Sit to Stand: Able to stand without using hands and stabilize independently Standing Unsupported: Able to stand safely 2 minutes Sitting with Back Unsupported but Feet Supported on Floor or Stool: Able to sit safely and securely 2 minutes Stand to Sit: Sits safely with minimal use of hands Transfers: Able  to transfer safely, minor use of hands Standing Unsupported with Eyes Closed: Able to stand 10 seconds with supervision Standing Ubsupported with Feet Together: Able to place feet together independently and stand for 1 minute with supervision From Standing, Reach Forward with Outstretched Arm: Can reach confidently >25 cm (10") From Standing Position, Pick up Object from Floor: Able to pick up shoe safely and easily From Standing Position, Turn to  Look Behind Over each Shoulder: Looks behind one side only/other side shows less weight shift Turn 360 Degrees: Able to turn 360 degrees safely but slowly Standing Unsupported, Alternately Place Feet on Step/Stool: Able to stand independently and safely and complete 8 steps in 20 seconds Standing Unsupported, One Foot in Front: Able to plae foot ahead of the other independently and hold 30 seconds Standing on One Leg: Able to lift leg independently and hold equal to or more than 3 seconds Total Score: 48 Dynamic Gait Index Level Surface: Normal Change in Gait Speed: Normal Gait with Horizontal Head Turns: Normal Gait with Vertical Head Turns: Normal Gait and Pivot Turn: Mild Impairment Step Over Obstacle: Mild Impairment Step Around Obstacles: Normal Steps: Normal Total Score: 22 Timed Up and Go Test TUG: Normal TUG;Cognitive TUG Normal TUG (seconds): 7 Cognitive TUG (seconds): 10 Static Sitting Balance Static Sitting - Level of Assistance: 7: Independent Dynamic Sitting Balance Dynamic Sitting - Level of Assistance: 7: Independent Static Standing Balance Static Standing - Level of Assistance: 7: Independent Dynamic Standing Balance Dynamic Standing - Level of Assistance: 6: Modified independent (Device/Increase time)  Functional Gait Assessment (FGA) Requirements: A marked 6-m (20-ft) walkway that is marked with a 30.48-cm (12-in) width.  __3      1. GAIT LEVEL SURFACE Instructions: Walk at your normal speed from here to the next mark  (6 m[20 ft]). Grading: [place] the highest category that applies. (3) Normal-Walks 6 m (20 ft) in less than 5.5 seconds, no assistive devices, good speed, no evidence for imbalance, normal gait pattern, deviates no more than 15.24 cm (6 in) outside of the 30.48-cm (12-in) walkway width. (2) Mild impairment-Walks 6 m (20 ft) in less than 7 seconds but greater than 5.5 seconds, uses assistive device, slower speed, mild gait deviations, or deviates 15.24-25.4 cm (6-10 in) outside of the 30.48-cm (12-in) walkway width. (1) Moderate impairment-Walks 6 m (20 ft), slow speed, abnormal gait pattern, evidence for imbalance, or deviates 25.4-38.1 cm (10-15 in) outside of the 30.48-cm (12-in) walkway width. Requires more than 7 seconds to ambulate 6 m (20 ft). (0) Severe impairment-Cannot walk 6 m (20 ft) without assistance,severe gait deviations or imbalance, deviates greater than 38.1 cm (15 in) outside of the 30.48-cm (12-in) walkway width or reaches and touches the wall.  __3      2. CHANGE IN GAIT SPEED Instructions: Begin walking at your normal pace (for 1.5 m [5 ft]). When I tell you "go," walk as fast as you can (for 1.5 m [5 ft]). When I tell you "slow," walk as slowly as you can (for 1.5 m [5 ft]). Grading: [place] the highest category that applies. (3) Normal-Able to smoothly change walking speed without loss of balance or gait deviation. Shows a significant difference in walking speeds between normal, fast, and slow speeds. Deviates no more than 15.24 cm (6 in) outside of the 30.48-cm (12-in) walkway width. (2) Mild impairment-Is able to change speed but demonstrates mild gait deviations, deviates 15.24-25.4 cm (6-10 in) outside of the 30.48-cm (12-in) walkway width, or no gait deviations but unable to achieve a significant change in velocity, or uses an assistive device. (1) Moderate impairment-Makes only minor adjustments to walking speed, or accomplishes a change in speed with significant gait deviations,  deviates 25.4-38.1 cm (10-15 in) outside the 30.48-cm (12-in) walkway width, or changes speed but loses balance but is able to recover and continue walking. (0) Severe impairment-Cannot change speeds, deviates greater than 38.1 cm (15 in) outside  30.48-cm (12-in) walkway width, or loses balance and has to reach for wall or be caught.  __3      3. GAIT WITH HORIZONTAL HEAD TURNS Instructions: Walk from here to the next mark 6 m (20 ft) away. Begin walking at your normal pace. Keep walking straight; after 3 steps, turn your head to the right and keep walking straight while looking to the right. After 3 more steps, turn your head to the left and keep walking straight while looking left. Continue alternating looking right and left every 3 steps until you have completed 2 repetitions in each direction. Grading: [place] the highest category that applies. (3) Normal-Performs head turns smoothly with no change in gait. Deviates no more than 15.24 cm (6 in) outside 30.48-cm (12-in) walkway width. (2) Mild impairment-Performs head turns smoothly with slight change in gait velocity (eg, minor disruption to smooth gait path), deviates 15.24-25.4 cm (6-10 in) outside 30.48-cm (12-in) walkway width, or uses an assistive device.  (1) Moderate impairment-Performs head turns with moderate change in gait velocity, slows down, deviates 25.4-38.1 cm (10-15 in) outside 30.48-cm (12-in) walkway width but recovers, can continue to walk. (0) Severe impairment-Performs task with severe disruption of gait (eg, staggers 38.1 cm [15 in] outside 30.48-cm (12-in) walkway width, loses balance, stops, or reaches for wall).  __3      4. GAIT WITH VERTICAL HEAD TURNS Instructions: Walk from here to the next mark (6 m [20 ft]). Begin walking at your normal pace. Keep walking straight; after 3 steps, tip your head up and keep walking straight while looking up. After 3 more steps, tip your head down, keep walking straight while looking down.  Continue alternating looking up and down every 3 steps until you have completed 2 repetitions in each direction. Grading: [place] the highest category that applies. (3) Normal-Performs head turns with no change in gait. Deviates no more than 15.24 cm (6 in) outside 30.48-cm (12-in) walkway width. (2) Mild impairment-Performs task with slight change in gait velocity (eg, minor disruption to smooth gait path), deviates 15.24-25.4 cm (6-10 in) outside 30.48-cm (12-in) walkway width or uses assistive device. (1) Moderate impairment-Performs task with moderate change in gait velocity, slows down, deviates 25.4-38.1 cm (10-15 in) outside 30.48-cm (12-in) walkway width but recovers, can continue to walk. (0) Severe impairment-Performs task with severe disruption of gait (eg, staggers 38.1 cm [15 in] outside 30.48-cm (12-in) walkway width, loses balance, stops, reaches for wall).  __2      5. GAIT AND PIVOT TURN Instructions: Begin with walking at your normal pace. When I tell you, "turn and stop," turn as quickly as you can to face the opposite direction and stop. Grading: [place] the highest category that applies. (3) Normal-Pivot turns safely within 3 seconds and stops quickly with no loss of balance. (2) Mild impairment-Pivot turns safely in _3 seconds and stops with no loss of balance, or pivot turns safely within 3 seconds and stops with mild imbalance, requires small steps to catch balance. (1) Moderate impairment-Turns slowly, requires verbal cueing, or requires several small steps to catch balance following turn and stop. (0) Severe impairment-Cannot turn safely, requires assistance to turn and stop.  __2      6. STEP OVER OBSTACLE Instructions: Begin walking at your normal speed. When you come to the shoe box, step over it, not around it, and keep walking. Grading: [place] the highest category that applies. (3) Normal-Is able to step over 2 stacked shoe boxes taped together (22.86 cm [  9 in] total  height) without changing gait speed; no evidence of imbalance. (2) Mild impairment-Is able to step over one shoe box (11.43 cm [4.5 in] total height) without changing gait speed; no evidence of imbalance. (1) Moderate impairment-Is able to step over one shoe box (11.43 cm [4.5 in] total height) but must slow down and adjust steps to clear box safely. May require verbal cueing. (0) Severe impairment-Cannot perform without assistance.  __1      7. GAIT WITH NARROW BASE OF SUPPORT Instructions: Walk on the floor with arms folded across the chest, feet aligned heel to toe in tandem for a distance of 3.6 m [12 ft]. The number of steps taken in a straight line are counted for a maximum of 10 steps. Grading: [place]ta [place] the highest category that applies. (3) Normal-Is able to ambulate for 10 steps heel to toe with no staggering. (2) Mild impairment-Ambulates 7-9 steps. (1) Moderate impairment-Ambulates 4-7 steps. (0) Severe impairment-Ambulates less than 4 steps heel to toe or cannot perform without assistance.  __1      8. GAIT WITH EYES CLOSED Instructions: Walk at your normal speed from here to the next mark (6 m [20 ft]) with your eyes closed. Grading: [place] the highest category that applies. (3) Normal-Walks 6 m (20 ft), no assistive devices, good speed, no evidence of imbalance, normal gait pattern, deviates no more than 15.24 cm (6 in) outside 30.48-cm (12-in) walkway width. Ambulates 6 m (20 ft) in less than 7 seconds. (2) Mild impairment-Walks 6 m (20 ft), uses assistive device, slower speed, mild gait deviations, deviates 15.24-25.4 cm (6-10 in) outside 30.48-cm (12-in) walkway width. Ambulates 6 m (20 ft) in less than 9 seconds but greater than 7 seconds. (1) Moderate impairment-Walks 6 m (20 ft), slow speed, abnormal gait pattern, evidence for imbalance, deviates 25.4-38.1 cm (10-15 in) outside 30.48-cm (12-in) walkway width. Requires more than 9 seconds to ambulate 6 m (20 ft). (0) Severe  impairment-Cannot walk 6 m (20 ft) without assistance, severe gait deviations or imbalance, deviates greater than 38.1 cm (15 in) outside 30.48-cm (12-in) walkway width or will not attempt task.  _2_      9. AMBULATING BACKWARDS Instructions: Walk backwards until I tell you to stop. Grading: [place] the highest category that applies. (3) Normal-Walks 6 m (20 ft), no assistive devices, good speed, no evidence for imbalance, normal gait pattern, deviates no more than 15.24 cm (6 in) outside 30.48-cm (12-in) walkway width. (2) Mild impairment-Walks 6 m (20 ft), uses assistive device, slower speed, mild gait deviations, deviates 15.24-25.4 cm (6-10 in) outside 30.48-cm (12-in) walkway width. (1) Moderate impairment-Walks 6 m (20 ft), slow speed, abnormal gait pattern, evidence for imbalance, deviates 25.4-38.1 cm (10-15 in) outside 30.48-cm (12-in) walkway width. (0) Severe impairment-Cannot walk 6 m (20 ft) without assistance, severe gait deviations or imbalance, deviates greater than 38.1 cm (15 in) outside 30.48-cm (12-in) walkway width or will not attempt task.  __2      10. STEPS Instructions: Walk up these stairs as you would at home (ie, using the rail if necessary). At the top turn around and walk down. Grading: [place] the highest category that applies. (3) Normal-Alternating feet, no rail. (2) Mild impairment-Alternating feet, must use rail. (1) Moderate impairment-Two feet to a stair; must use rail. (0) Severe impairment-Cannot do safely.  TOTAL SCORE: ____22__ /30 (MAXIMUM SCORE=30)  Scores of ? 22/30 on the FGA were found to be effective in predicting falls, Sensitivity 85%, Specificity 86% Scores  of ? 20/30 on the FGA were optimal to predict older adults residing in community dwellings who would sustain unexplained falls in the next 6 months, Sensitivity 100%, Specificity 76% ([place], 2010; aged 69 to 53, Older Adults) [place]: 4.2 points for CVA Augustin Coupe et al, 2010) [facility]: 8 points  for Balance and Vestibular Disorders Marjorie Smolder and Augustin Coupe, 2014)   Extremity Assessment   BUE WFL for mobility tasks; defer to OT for detailed examination   RLE Assessment RLE Assessment: Exceptions to [facility] RLE Strength Right Hip Flexion: 4/5 Right Knee Flexion: 4/5 Right Knee Extension: 4/5 Right Ankle Dorsiflexion: 4+/5 LLE Strength Left Hip Flexion: 4+/5 Left Knee Flexion: 4+/5 Left Knee Extension: 4/5 Left Ankle Dorsiflexion: 4+/5  Skilled Therapeutic Intervention: Pt received seated in w/c denies pain and agreeable to treatment. Assessed mobility as described above with modI overall, S for stairs. Reassessed 6 minute walk test with pt only able to complete 4 min 33 sec and then required seated rest break d/t sudden onset R hip pain. Average gait speed during trial 3.2 ft/sec, compared to average speed during previous trial two days ago 2.8 ft/sec. Educated pt in [place] B exercises and discussed with pt and wife progression of exercises, holding onto nearby support surface to catch balance. Performed nustep x9 min level 5 with average >45 steps/min for focus on aerobic endurance. Remained in room with wife present, all needs in reach. Made modI in room at this time; RN alerted.    See Function Navigator for Current Functional Status.  Benjiman Core Tygielski 07/29/2017, 2:32 PM

## 2017-07-29 NOTE — Progress Notes (Signed)
Occupational Therapy Session Note  Patient Details  Name: [name] MRN: [id number] Date of Birth: 1940-02-24  Today's Date: 07/29/2017 OT Individual Time: 0349-1791 OT Individual Time Calculation (min): 57 min    Short Term Goals: Week 1:  OT Short Term Goal 1 (Week 1): Pt will ambulate into bathroom with no more than min A OT Short Term Goal 2 (Week 1): Pt will maintain standing balance within BADL task with no more than CGA OT Short Term Goal 3 (Week 1): Pt will demonstrate improved safety awareness by locking wc breaks prior to standing without verbal cues  Skilled Therapeutic Interventions/Progress Updates:    Pt completed ADL session to begin session.  He ambulated around the room with modified independence but still relies on placing his hands on different surfaces in the room for balance.  He stood for most of shower, using the grab bar for support and lifting his LEs one at a time up on the shower bench.  Discussed that this is not safe and it would be better for pt to sit down to do this.  He voiced understanding.  Pt also stands to donn underpants and pants over his feet as well.  Grooming tasks the sink with modified independence.  He did sit to donn his socks and shoes from the bed side recliner.  Second part of session had pt work on functional mobility with modified independence.  Min assist for tandem walking on the green line in the hallway.  Modified independence for retrieving items off of the floor in the day room as well.  Had pt stand on foam surface for use of the [employer] for 3 sets of 1 min.  Supervision for balance with reaction time at 1.3-1 second.  Returned to room with pt in the wheelchair and call button and phone in reach.  Chair alarm in place and safety belt as well.    Therapy Documentation Precautions:  Precautions Precautions: Fall Restrictions Weight Bearing Restrictions: No  Pain: Pain Assessment Pain Scale: 0-10 Pain Score: 0-No pain ADL: See  Function Navigator for Current Functional Status.   Therapy/Group: Individual Therapy  Anthonee Gelin OTR/L 07/29/2017, 12:10 PM

## 2017-07-29 NOTE — Progress Notes (Signed)
Social Work  Discharge Note  The overall goal for the admission was met for:   Discharge location: Yes-HOME WITH WIFE WHO IS THERE WITH HIM  Length of Stay: Yes-7 DAYS  Discharge activity level: Yes-MOD/I LEVEL  Home/community participation: Yes  Services provided included: MD, RD, PT, OT, SLP, RN, CM, Pharmacy and SW  Financial Services: Medicare and Private Insurance: [employer]   Follow-up services arranged: Outpatient: [facility]-PT 6/24 2:30-4;00  Comments (or additional information): PT DID WELL AND PROGRESSED QUICKLY AND REACHED MOD/I LEVEL. READY TO GO HOME  Patient/Family verbalized understanding of follow-up arrangements: Yes  Individual responsible for coordination of the follow-up plan: SELF & [name]-WIFE  Confirmed correct DME delivered: Elease Hashimoto 07/29/2017    Elease Hashimoto

## 2017-07-29 NOTE — Plan of Care (Signed)
  Problem: RH SAFETY Goal: RH STG ADHERE TO SAFETY PRECAUTIONS W/ASSISTANCE/DEVICE Description STG Adhere to Safety Precautions With min Assistance/Device.  Outcome: Progressing  Continue maintain safety, proper footwear, bed/chair alarm

## 2017-07-30 LAB — GLUCOSE, CAPILLARY: Glucose-Capillary: 96 mg/dL (ref 65–99)

## 2017-07-30 MED ORDER — MECLIZINE HCL 25 MG PO TABS: 25.0000 mg | ORAL_TABLET | Freq: Three times a day (TID) | ORAL | 0 refills | Status: DC | PRN

## 2017-07-30 NOTE — Plan of Care (Signed)
  Problem: Consults Goal: RH STROKE PATIENT EDUCATION Description See Patient Education module for education specifics  Outcome: Progressing   Problem: RH SKIN INTEGRITY Goal: RH STG SKIN FREE OF INFECTION/BREAKDOWN Description Patients skin will remain free from further infection or breakdown with min assist.   Outcome: Progressing Goal: RH STG MAINTAIN SKIN INTEGRITY WITH ASSISTANCE Description STG Maintain Skin Integrity With min Assistance.  Outcome: Progressing   Problem: RH SAFETY Goal: RH STG ADHERE TO SAFETY PRECAUTIONS W/ASSISTANCE/DEVICE Description STG Adhere to Safety Precautions With min Assistance/Device.  Outcome: Progressing   Problem: RH COGNITION-NURSING Goal: RH STG USES MEMORY AIDS/STRATEGIES W/ASSIST TO PROBLEM SOLVE Description STG Uses Memory Aids/Strategies With min Assistance to Problem Solve.  Outcome: Progressing   Problem: RH KNOWLEDGE DEFICIT Goal: RH STG INCREASE KNOWLEDGE OF HYPERTENSION Description With min assist.  Outcome: Progressing  Continue to meet pt needs.

## 2017-07-30 NOTE — Progress Notes (Signed)
Pt A/O, no noted distress. Pt has belongings. He has medication from pharmacy, as well. Pt was wheeled down to lobby for discharge.

## 2017-07-30 NOTE — Discharge Instructions (Signed)
Inpatient Rehab Discharge Instructions  [name] Discharge date and time:  07/30/17  Activities/Precautions/ Functional Status: Activity: no lifting, driving, or strenuous exercise for till cleared by MD Diet: cardiac diet and diabetic diet Wound Care: none needed    Functional status:  ___ No restrictions     ___ Walk up steps independently ___ 24/7 supervision/assistance   ___ Walk up steps with assistance _X__ Intermittent supervision/assistance  ___ Bathe/dress independently ___ Walk with walker     ___ Bathe/dress with assistance ___ Walk Independently    ___ Shower independently ___ Walk with assistance    _X__ Shower with supervision _X__ No alcohol     ___ Return to work/school ________   Special Instructions: 1. No driving till cleared by MD    COMMUNITY REFERRALS UPON DISCHARGE:    Outpatient: PT  Agency: [facility]   Date of Last Service:07/30/2017  Appointment Date/Time:JUNE 24 Monday 2:30-4:00 PM  Medical Equipment/Items Ordered:NONE NEEDED     STROKE/TIA DISCHARGE INSTRUCTIONS SMOKING Cigarette smoking nearly doubles your risk of having a stroke & is the single most alterable risk factor  If you smoke or have smoked in the last 12 months, you are advised to quit smoking for your health.  Most of the excess cardiovascular risk related to smoking disappears within a year of stopping.  Ask you doctor about anti-smoking medications  [place] Quit Line: 1-800-QUIT NOW  Free Smoking Cessation Classes [phone]  CHOLESTEROL Know your levels; limit fat & cholesterol in your diet  Lipid Panel     Component Value Date/Time   CHOL 134 07/20/2017 0525   TRIG 166 (H) 07/20/2017 0525   HDL 33 (L) 07/20/2017 0525   CHOLHDL 4.1 07/20/2017 0525   VLDL 33 07/20/2017 0525   LDLCALC 68 07/20/2017 0525      Many patients benefit from treatment even if their cholesterol is at goal.  Goal: Total Cholesterol (CHOL) less than 160  Goal:   Triglycerides (TRIG) less than 150  Goal:  HDL greater than 40  Goal:  LDL (LDLCALC) less than 100   BLOOD PRESSURE American Stroke Association blood pressure target is less that 120/80 mm/Hg  Your discharge blood pressure is:  BP: (!) 157/79  Monitor your blood pressure  Limit your salt and alcohol intake  Many individuals will require more than one medication for high blood pressure  DIABETES (A1c is a blood sugar average for last 3 months) Goal HGBA1c is under 7% (HBGA1c is blood sugar average for last 3 months)  Diabetes:     Lab Results  Component Value Date   HGBA1C 6.1 (H) 07/20/2017     Your HGBA1c can be lowered with medications, healthy diet, and exercise.  Check your blood sugar as directed by your physician  Call your physician if you experience unexplained or low blood sugars.  PHYSICAL ACTIVITY/REHABILITATION Goal is 30 minutes at least 4 days per week  Activity: As tolerated Therapies: see above  Return to work: N/A  Activity decreases your risk of heart attack and stroke and makes your heart stronger.  It helps control your weight and blood pressure; helps you relax and can improve your mood.  Participate in a regular exercise program.  Talk with your doctor about the best form of exercise for you (dancing, walking, swimming, cycling).  DIET/WEIGHT Goal is to maintain a healthy weight  Your discharge diet is:  Diet Order           Diet Carb  Modified Fluid consistency: Thin; Room service appropriate? Yes  Diet effective now         liquids Your height is:   Your current weight is: Weight: 86.3 kg (190 lb 4.1 oz)(has on clothes & shoes) Your Body Mass Index (BMI) is:  BMI (Calculated): 29.79  Following the type of diet specifically designed for you will help prevent another stroke.  Your goal weight is:   Your goal Body Mass Index (BMI) is 19-24.  Healthy food habits can help reduce 3 risk factors for stroke:  High cholesterol, hypertension, and  excess weight.  RESOURCES Stroke/Support Group:  Call [phone]   STROKE EDUCATION PROVIDED/REVIEWED AND GIVEN TO PATIENT Stroke warning signs and symptoms How to activate emergency medical system (call 911). Medications prescribed at discharge. Need for follow-up after discharge. Personal risk factors for stroke. Pneumonia vaccine given:  Flu vaccine given:  My questions have been answered, the writing is legible, and I understand these instructions.  I will adhere to these goals & educational materials that have been provided to me after my discharge from the hospital.      Information on my medicine - ELIQUIS (apixaban)  This medication education was reviewed with me or my healthcare representative as part of my discharge preparation.    Why was Eliquis prescribed for you? Eliquis was prescribed for you to reduce the risk of forming blood clots that can cause a stroke.  What do You need to know about Eliquis ? Take your Eliquis TWICE DAILY - one tablet in the morning and one tablet in the evening with or without food.  It would be best to take the doses about the same time each day.  If you have difficulty swallowing the tablet whole please discuss with your pharmacist how to take the medication safely.  Take Eliquis exactly as prescribed by your doctor and DO NOT stop taking Eliquis without talking to the doctor who prescribed the medication.  Stopping may increase your risk of developing a new clot or stroke.  Refill your prescription before you run out.  After discharge, you should have regular check-up appointments with your healthcare provider that is prescribing your Eliquis.  In the future your dose may need to be changed if your kidney function or weight changes by a significant amount or as you get older.  What do you do if you miss a dose? If you miss a dose, take it as soon as you remember on the same day and resume taking twice daily.  Do not take more than  one dose of ELIQUIS at the same time.  Important Safety Information A possible side effect of Eliquis is bleeding. You should call your healthcare provider right away if you experience any of the following: ? Bleeding from an injury or your nose that does not stop. ? Unusual colored urine (red or dark brown) or unusual colored stools (red or black). ? Unusual bruising for unknown reasons. ? A serious fall or if you hit your head (even if there is no bleeding).  Some medicines may interact with Eliquis and might increase your risk of bleeding or clotting while on Eliquis. To help avoid this, consult your healthcare provider or pharmacist prior to using any new prescription or non-prescription medications, including herbals, vitamins, non-steroidal anti-inflammatory drugs (NSAIDs) and supplements.  This website has more information on Eliquis (apixaban): [email or web address].  My questions have been answered and I understand these instructions. I will adhere to these goals and the provided  educational materials after my discharge from the hospital.  Patient/Caregiver Signature _______________________________ Date __________  Clinician Signature _______________________________________ Date __________  Please bring this form and your medication list with you to all your follow-up doctor's appointments.

## 2017-07-30 NOTE — Progress Notes (Signed)
Subjective/Complaints:  Discussed location of stroke  ROS neg CP, SOB, NVD, freq stools , no abd pain Objective: Vital Signs: Blood pressure (!) 165/71, pulse (!) 57, temperature 98.3 F (36.8 C), temperature source Oral, resp. rate 18, weight 86.3 kg (190 lb 4.1 oz), SpO2 97 %. No results found. Results for orders placed or performed during the hospital encounter of 07/23/17 (from the past 72 hour(s))  Glucose, capillary     Status: Abnormal   Collection Time: 07/27/17 11:40 AM  Result Value Ref Range   Glucose-Capillary 111 (H) 65 - 99 mg/dL  Glucose, capillary     Status: Abnormal   Collection Time: 07/27/17  5:06 PM  Result Value Ref Range   Glucose-Capillary 200 (H) 65 - 99 mg/dL  Glucose, capillary     Status: Abnormal   Collection Time: 07/27/17  9:25 PM  Result Value Ref Range   Glucose-Capillary 153 (H) 65 - 99 mg/dL  Glucose, capillary     Status: Abnormal   Collection Time: 07/28/17  6:30 AM  Result Value Ref Range   Glucose-Capillary 106 (H) 65 - 99 mg/dL  Basic metabolic panel     Status: Abnormal   Collection Time: 07/28/17  7:26 AM  Result Value Ref Range   Sodium 142 135 - 145 mmol/L   Potassium 3.7 3.5 - 5.1 mmol/L   Chloride 109 101 - 111 mmol/L   CO2 27 22 - 32 mmol/L   Glucose, Bld 112 (H) 65 - 99 mg/dL   BUN 14 6 - 20 mg/dL   Creatinine, Ser 1.63 (H) 0.61 - 1.24 mg/dL   Calcium 8.8 (L) 8.9 - 10.3 mg/dL   GFR calc non Af Amer 38 (L) >60 mL/min   GFR calc Af Amer 45 (L) >60 mL/min    Comment: (NOTE) The eGFR has been calculated using the CKD EPI equation. This calculation has not been validated in all clinical situations. eGFR's persistently <60 mL/min signify possible Chronic Kidney Disease.    Anion gap 6 5 - 15    Comment: Performed at [facility] [address] [place], [name] [address]  Glucose, capillary     Status: Abnormal   Collection Time: 07/28/17 12:09 PM  Result Value Ref Range   Glucose-Capillary 101 (H) 65 - 99 mg/dL   Glucose, capillary     Status: Abnormal   Collection Time: 07/28/17  4:25 PM  Result Value Ref Range   Glucose-Capillary 138 (H) 65 - 99 mg/dL  Glucose, capillary     Status: Abnormal   Collection Time: 07/28/17  8:52 PM  Result Value Ref Range   Glucose-Capillary 188 (H) 65 - 99 mg/dL  Glucose, capillary     Status: None   Collection Time: 07/29/17  6:44 AM  Result Value Ref Range   Glucose-Capillary 81 65 - 99 mg/dL  Glucose, capillary     Status: Abnormal   Collection Time: 07/29/17 11:21 AM  Result Value Ref Range   Glucose-Capillary 135 (H) 65 - 99 mg/dL  Glucose, capillary     Status: Abnormal   Collection Time: 07/29/17  4:35 PM  Result Value Ref Range   Glucose-Capillary 145 (H) 65 - 99 mg/dL  Glucose, capillary     Status: Abnormal   Collection Time: 07/29/17  9:12 PM  Result Value Ref Range   Glucose-Capillary 119 (H) 65 - 99 mg/dL  Glucose, capillary     Status: None   Collection Time: 07/30/17  6:46 AM  Result Value  Ref Range   Glucose-Capillary 96 65 - 99 mg/dL     HEENT: Eyes mildly injected Cardio: RRR and no murmur Resp: CTA B/L and Unlabored GI: BS positive and NT, ND Extremity:  Pulses positive and No Edema Skin:   Intact Neuro: Alert/Oriented, Normal Sensory and Abnormal Motor 4+ RUE adn RLE, 5/5 left side UE adn LE Musc/Skel:  Normal and Other no pain with UE or LE AROM Gen NAD   Assessment/Plan: 1. Functional deficits secondary to Right hemiparesis left subcortical infarct  Stable for D/C today F/u PCP in 3-4 weeks F/u PM&R 2 weeks See D/C summary See D/C instructions FIM: Function - Bathing Position: Shower Body parts bathed by patient: Right arm, Left arm, Abdomen, Front perineal area, Buttocks, Right upper leg, Left upper leg, Right lower leg, Left lower leg Bathing not applicable: Back Assist Level: More than reasonable time  Function- Upper Body Dressing/Undressing What is the patient wearing?: Pull over shirt/dress Pull over  shirt/dress - Perfomed by patient: Thread/unthread right sleeve, Thread/unthread left sleeve, Put head through opening, Pull shirt over trunk Assist Level: More than reasonable time Function - Lower Body Dressing/Undressing What is the patient wearing?: Pants, Socks, Shoes, Underwear Position: Sitting EOB Underwear - Performed by patient: Thread/unthread right underwear leg, Thread/unthread left underwear leg, Pull underwear up/down Pants- Performed by patient: Thread/unthread right pants leg, Thread/unthread left pants leg, Pull pants up/down Non-skid slipper socks- Performed by patient: Don/doff right sock, Don/doff left sock Socks - Performed by patient: Don/doff right sock, Don/doff left sock Shoes - Performed by patient: Don/doff right shoe, Don/doff left shoe, Fasten right, Fasten left Shoes - Performed by helper: Fasten right, Fasten left Assist for footwear: Supervision/touching assist Assist for lower body dressing: More than reasonable time  Function - Toileting Toileting steps completed by patient: Adjust clothing prior to toileting, Performs perineal hygiene, Adjust clothing after toileting Toileting steps completed by helper: Adjust clothing prior to toileting, Adjust clothing after toileting [place]: Grab bar or rail Assist level: More than reasonable time  Function - Toilet Transfers Toilet transfer assistive device: Grab bar Assist level to toilet: No Help, no cues, assistive device, takes more than a reasonable amount of time Assist level from toilet: No Help, no cues, assistive device, takes more than a reasonable amount of time  Function - Chair/bed transfer Chair/bed transfer method: Ambulatory Chair/bed transfer assist level: No Help, no cues, assistive device, takes more than a reasonable amount of time Chair/bed transfer details: Verbal cues for sequencing, Verbal cues for technique, Verbal cues for precautions/safety  Function - Locomotion:  Wheelchair Will patient use wheelchair at discharge?: No Wheelchair activity did not occur: N/A Max wheelchair distance: 100 Assist Level: Supervision or verbal cues Wheel 50 feet with 2 turns activity did not occur: N/A Assist Level: Supervision or verbal cues Wheel 150 feet activity did not occur: N/A Turns around,maneuvers to table,bed, and toilet,negotiates 3% grade,maneuvers on rugs and over doorsills: No Function - Locomotion: Ambulation Assistive device: No device Max distance: 300' Assist level: No help, No cues, assistive device, takes more than a reasonable amount of time Assist level: No help, No cues, assistive device, takes more than a reasonable amount of time Assist level: No help, No cues, assistive device, takes more than a reasonable amount of time Walk 150 feet activity did not occur: Safety/medical concerns Assist level: No help, No cues, assistive device, takes more than a reasonable amount of time Walk 10 feet on uneven surfaces activity did not occur:  Safety/medical concerns Assist level: No help, No cues, assistive device, takes more than a reasonable amount of time  Function - Comprehension Comprehension: Auditory Comprehension assistive device: Hearing aids Comprehension assist level: Follows complex conversation/direction with extra time/assistive device  Function - Expression Expression: Verbal Expression assist level: Expresses complex ideas: With extra time/assistive device  Function - Social Interaction Social Interaction assist level: Interacts appropriately 90% of the time - Needs monitoring or encouragement for participation or interaction.  Function - Problem Solving Problem solving assist level: Solves basic 90% of the time/requires cueing < 10% of the time  Function - Memory Memory assist level: Recognizes or recalls 90% of the time/requires cueing < 10% of the time Patient normally able to recall (first 3 days only): Current season, Location  of own room, Staff names and faces, That he or she is in a hospital  Medical Problem List and Plan:  1. Right hemiparesis and functional deficits secondary to ?left subcortical infarct, hx of myasthenia gravis.  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 DVT Prophylaxis/Anticoagulation: Pharmaceutical: Other (comment)--Eliquis  3. Pain Management: tylenol prn.  4. Mood: LCSW to follow for evaluation and support.  5. Neuropsych: This patient is capable of making decisions on his own behalf.  6. Skin/Wound Care: Routine pressure relief measures.  7. Fluids/Electrolytes/Nutrition: Monitor I/O. Check lytes in am.  8. MG: On Mestinon and prednisone.  9. 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 GERD: Resume Zantac bid.  12. HTN: Monitor BP bid--continue Imdur.  Vitals:   07/29/17 2053 07/30/17 0507  BP: 137/72 (!) 165/71  Pulse: 82 (!) 57  Resp: 20 18  Temp: 98.7 F (37.1 C) 98.3 F (36.8 C)  SpO2: 98% 97%  Elevated this am but has been controlled no med changes f/u with PCP 13. CAD : Monitor for symptoms with activity. On Imdur and Crestor.  14. Glaucoma: Managed with Timolol and Alphagan bid.  -on tobrex eye gtts recently for recent infection  -steroid gtts for inflammation (already receiving prednisone PO but low dose) f/u optho  15.  CKD 3 baseline creat ~1.6  LOS (Days) 7 A FACE TO FACE EVALUATION WAS PERFORMED  Charlett Blake 07/30/2017, 7:19 AM

## 2017-08-02 ENCOUNTER — Telehealth: Payer: Self-pay | Admitting: Registered Nurse

## 2017-08-02 DIAGNOSIS — R2689 Other abnormalities of gait and mobility: Secondary | ICD-10-CM | POA: Diagnosis not present

## 2017-08-02 DIAGNOSIS — M6281 Muscle weakness (generalized): Secondary | ICD-10-CM | POA: Diagnosis not present

## 2017-08-02 DIAGNOSIS — R2681 Unsteadiness on feet: Secondary | ICD-10-CM | POA: Diagnosis not present

## 2017-08-02 NOTE — Telephone Encounter (Signed)
Transitional care call placed, left message to return the call.

## 2017-08-03 NOTE — Telephone Encounter (Signed)
Transitional Care call  Patient name: [name]  DOB: [birth year] 1. Are you/is patient experiencing any problems since coming home? No a. Are there any questions regarding any aspect of care? No 2. Are there any questions regarding medications administration/dosing? No a. Are meds being taken as prescribed? Yes b. "Patient should review meds with caller to confirm" Medication List Reviewed 3. Have there been any falls? No 4. Has Home Health been to the house and/or have they contacted you? Going to [facility], first appointment was on 08/02/2017. a. If not, have you tried to contact them? NA b. Can we help you contact them? No 5. Are bowels and bladder emptying properly? Yes a. Are there any unexpected incontinence issues? No b. If applicable, is patient following bowel/bladder programs? NA 6. Any fevers, problems with breathing, unexpected pain? No 7. Are there any skin problems or new areas of breakdown? No 8. Has the patient/family member arranged specialty MD follow up (ie cardiology/neurology/renal/surgical/etc.)?  Mr. Brannen was instructed to call for follow up appointments, he verbalizes. He will call today he stated.  a. Can we help arrange? NA 9. Does the patient need any other services or support that we can help arrange? No 10. Are caregivers following through as expected in assisting the patient? Yes 11. Has the patient quit smoking, drinking alcohol, or using drugs as recommended? Mr. Shonka denies smoking, drining alcohol or using illicit drugs.   Appointment date/time July 2nd, 2019, arrival time at 10:20 for 10:40 appointment with Danella Sensing ANP-C. At [facility]

## 2017-08-05 ENCOUNTER — Ambulatory Visit (INDEPENDENT_AMBULATORY_CARE_PROVIDER_SITE_OTHER): Payer: Medicare Other | Admitting: Endocrinology

## 2017-08-05 ENCOUNTER — Encounter: Payer: Self-pay | Admitting: Endocrinology

## 2017-08-05 ENCOUNTER — Ambulatory Visit: Payer: Medicare Other | Admitting: Emergency Medicine

## 2017-08-05 DIAGNOSIS — D649 Anemia, unspecified: Secondary | ICD-10-CM

## 2017-08-05 DIAGNOSIS — D62 Acute posthemorrhagic anemia: Secondary | ICD-10-CM | POA: Insufficient documentation

## 2017-08-05 DIAGNOSIS — I639 Cerebral infarction, unspecified: Secondary | ICD-10-CM | POA: Diagnosis not present

## 2017-08-05 LAB — BASIC METABOLIC PANEL
BUN: 13 mg/dL (ref 6–23)
CO2: 32 mEq/L (ref 19–32)
CREATININE: 1.72 mg/dL — AB (ref 0.40–1.50)
Calcium: 8.9 mg/dL (ref 8.4–10.5)
Chloride: 104 mEq/L (ref 96–112)
GFR: 40.94 mL/min — AB (ref >60.00)
Glucose, Bld: 157 mg/dL — ABNORMAL HIGH (ref 70–99)
Potassium: 3.8 mEq/L (ref 3.5–5.1)
Sodium: 141 mEq/L (ref 135–145)

## 2017-08-05 LAB — CBC WITH DIFFERENTIAL/PLATELET
BASOS ABS: 0 10*3/uL (ref 0.0–0.1)
Basophils Relative: 0.3 % (ref 0.0–3.0)
EOS ABS: 0.2 10*3/uL (ref 0.0–0.7)
Eosinophils Relative: 2.8 % (ref 0.0–5.0)
HEMATOCRIT: 33.9 % — AB (ref 39.0–52.0)
Hemoglobin: 11.5 g/dL — ABNORMAL LOW (ref 13.0–17.0)
LYMPHS ABS: 1.5 10*3/uL (ref 0.7–4.0)
LYMPHS PCT: 18.1 % (ref 12.0–46.0)
MCHC: 34.1 g/dL (ref 30.0–36.0)
MCV: 97.7 fl (ref 78.0–100.0)
MONO ABS: 1 10*3/uL (ref 0.1–1.0)
Monocytes Relative: 11.9 % (ref 3.0–12.0)
NEUTROS ABS: 5.6 10*3/uL (ref 1.4–7.7)
NEUTROS PCT: 66.9 % (ref 43.0–77.0)
PLATELETS: 214 10*3/uL (ref 150.0–400.0)
RBC: 3.47 Mil/uL — ABNORMAL LOW (ref 4.22–5.81)
RDW: 15 % (ref 11.5–15.5)
WBC: 8.4 10*3/uL (ref 4.0–10.5)

## 2017-08-05 LAB — IBC PANEL
IRON: 132 ug/dL (ref 42–165)
Saturation Ratios: 39.5 % (ref 20.0–50.0)
TRANSFERRIN: 239 mg/dL (ref 212.0–360.0)

## 2017-08-05 LAB — VITAMIN D 25 HYDROXY (VIT D DEFICIENCY, FRACTURES): VITD: 45.84 ng/mL (ref 30.00–100.00)

## 2017-08-05 NOTE — Patient Instructions (Addendum)
blood tests are requested for you today.  We'll let you know about the results.   Please hold off on the lisinopril and lasix for now.  It is critically important to prevent falling down (keep floor areas well-lit, dry, and free of loose objects.  If you have a cane, walker, or wheelchair, you should use it, even for short trips around the house.  Wear flat-soled shoes.  Also, try not to rush).   Please come back for a follow-up appointment in 2 months.

## 2017-08-05 NOTE — Progress Notes (Signed)
Subjective:    Patient ID: [name], male    DOB: 1940-10-19, 77 y.o.   MRN: [id number]  HPI Pt was recently in the hospital with CVA.  He presented with moderate dizziness in the head, and assoc n/v. Pt says he is now 80% better.   Hypocalcemia: was noted in the hospital.  He takes vit-D, 1000 units/day.   Lasix and zestril were stopped in the hospital.   Past Medical History:  Diagnosis Date  . Bruises easily    d/t being on Eliquis  . Carotid artery disease ([place])    a. Duplex 05/2014: 95-63% RICA, 8-75% LICA, elevated velocities in right subclavian artery, normal left subclavian artery..  . Chronic back pain    HNP  . Chronic kidney disease (CKD)   . Claustrophobia    takes Ativan as needed  . 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 Coronary atherosclerosis of unspecified type of vessel, native or graft    a. cath in 2003 showed 10-20% LM, scattered 20% prox-mid LAD, 30% more distal LAD, 50-60% stenosis of LAD towards apex, 20% Cx, 30% dRCA, focal 95% stenosis in smaller of 2 branches of PDA, EF 60-65%.  . Diabetes mellitus ([place])   . Diarrhea    takes Imodium daily as needed  . Diverticulosis   . Diverticulosis of colon (without mention of hemorrhage)   . DJD (degenerative joint disease)   . DVT (deep venous thrombosis) ([place]) 07/2011  . Esophageal stricture   . Essential hypertension    takes Imdur and Lisinopril daily  . Gastric polyps    benign  . GERD (gastroesophageal reflux disease)    takes Nexium daily  . Glaucoma    uses eye drops  . History of bronchitis    not sure when the last time  . History of colon polyps    benign  . History of kidney stones   . History of kidney stones   . Insomnia    takes Melatonin nightly as needed  . Mixed hyperlipidemia    takes Crestor daily  . Myasthenia gravis ([place])    uses prednisone  . Osteoporosis 2016  . Peripheral edema    takes Lasix daily  . Pituitary tumor    checks Dr[facility]Walden at  [facility] checks it every Jan   . Pneumonia 01/2016  . PONV (postoperative nausea and vomiting)   . Ptosis   . Pulmonary embolus ([place]) 2012  . Stroke [facility]) 06/2015    Past Surgical History:  Procedure Laterality Date  . ABDOMINAL AORTOGRAM W/LOWER EXTREMITY N/A 11/18/2016   Procedure: ABDOMINAL AORTOGRAM W/LOWER EXTREMITY;  Surgeon: [name], MD;  Location: [place] [facility];  Service: Cardiovascular;  Laterality: N/A;  . BACK SURGERY     x2, lumbar and cervical  . CARDIAC CATHETERIZATION    . CATARACT EXTRACTION Bilateral 12/12  . COLONOSCOPY    . ESOPHAGOGASTRODUODENOSCOPY ENDOSCOPY  04/2015  . IR GENERIC HISTORICAL  03/03/2016   IR IVC FILTER PLMT / S&I [name] GUID/MOD SED 03/03/2016 [name], MD [facility]  . IR GENERIC HISTORICAL  04/09/2016   IR RADIOLOGIST EVAL & MGMT 04/09/2016 [name], MD [facility]  . IR GENERIC HISTORICAL  04/30/2016   IR IVC FILTER RETRIEVAL / S&I [name] GUID/MOD SED 04/30/2016 [name], MD [facility]  . LITHOTRIPSY    . LUMBAR LAMINECTOMY/DECOMPRESSION MICRODISCECTOMY Right 09/14/2012   Procedure: Right Lumbar three-four, four-five, Lumbar five-Sacral one decompressive  laminectomy;  Surgeon: [name], MD;  Location: [place] [facility];  Service: Neurosurgery;  Laterality: Right;  . LUMBAR LAMINECTOMY/DECOMPRESSION MICRODISCECTOMY Left 03/11/2016   Procedure: Left Lumbar Two-ThreeMicrodiscectomy;  Surgeon: [name], MD;  Location: [place];  Service: Neurosurgery;  Laterality: Left;  . PERIPHERAL VASCULAR INTERVENTION  11/18/2016   Procedure: PERIPHERAL VASCULAR INTERVENTION;  Surgeon: [name], MD;  Location: [place] [facility];  Service: Cardiovascular;;  Left popliteal  . SHOULDER SURGERY Left 05/07/2009  . TONSILLECTOMY      Social History   Socioeconomic History  . Marital status: Married    Spouse name: Hassan Rowan  . Number of children: 2  . Years of education: 1-College  . Highest education level: Not on file    Occupational History  . 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 Financial resource strain: Not on file  . Food insecurity:    Worry: Not on file    Inability: Not on file  . Transportation needs:    Medical: Not on file    Non-medical: Not on file  Tobacco Use  . Smoking status: Never Smoker  . Smokeless tobacco: Never Used  Substance and Sexual Activity  . Alcohol use: No    Alcohol/week: 0.0 oz  . Drug use: No  . Sexual activity: Not Currently  Lifestyle  . Physical activity:    Days per week: Not on file    Minutes per session: Not on file  . Stress: Not on file  Relationships  . Social connections:    Talks on phone: Not on file    Gets together: Not on file    Attends religious service: Not on file    Active member of club or organization: Not on file    Attends meetings of clubs or organizations: Not on file    Relationship status: Not on file  . Intimate partner violence:    Fear of current or ex partner: Not on file    Emotionally abused: Not on file    Physically abused: Not on file    Forced sexual activity: Not on file  Other Topics Concern  . Not on file  Social History Narrative   Lives at home w/ his wife   Patient is right handed.   Patient has 1-2 cups of caffeine daily.    Current Outpatient Medications on File Prior to Visit  Medication Sig Dispense Refill  . acetaminophen (TYLENOL) 325 MG tablet Take 1-2 tablets (325-650 mg total) by mouth every 4 (four) hours as needed for mild pain.    . brimonidine-timolol (COMBIGAN) 0.2-0.5 % ophthalmic solution Place 1 drop into both eyes every 12 (twelve) hours.    . cholecalciferol (VITAMIN D) 1000 units tablet Take 1,000 Units by mouth daily.     Marland Kitchen ELIQUIS 5 MG TABS tablet TAKE 1 TABLET (5 MG TOTAL) BY MOUTH 2 (TWO) TIMES DAILY. 60 tablet 3  . imatinib (GLEEVEC) 100 MG tablet Take 300 mg by mouth daily at 12 noon. Take with meals and large glass of  water.Caution:Chemotherapy     . isosorbide mononitrate (IMDUR) 30 MG 24 hr tablet Take 1 tablet (30 mg total) by mouth daily. 90 tablet 3  . lidocaine (LIDODERM) 5 % Place 1 patch onto the skin daily as needed (for back pain). Remove & Discard patch within 12 hours or as directed by MD     . loperamide (IMODIUM) 2 MG capsule  Take 2-4 mg by mouth 3 (three) times daily as needed for diarrhea or loose stools.     . meclizine (ANTIVERT) 25 MG tablet Take 1 tablet (25 mg total) by mouth 3 (three) times daily as needed for dizziness or nausea. 60 tablet 0  . Melatonin 3 MG TABS Take 3 mg by mouth at bedtime as needed (for sleep).     . Multiple Vitamin (MULTIVITAMIN) tablet Take 1 tablet by mouth daily.    . naphazoline-glycerin (CLEAR EYES REDNESS) 0.012-0.2 % SOLN Place 1-2 drops into the left eye 3 (three) times daily after meals.  0  . predniSONE (DELTASONE) 1 MG tablet Take 3 tablets (3 mg total) by mouth daily with breakfast. 270 tablet 1  . pyridostigmine (MESTINON) 60 MG tablet Take 60 mg by mouth daily.    . ranitidine (ZANTAC) 150 MG tablet Take 150 mg by mouth 2 (two) times daily as needed for heartburn.    . rosuvastatin (CRESTOR) 10 MG tablet Take 5 mg by mouth daily.    . traMADol (ULTRAM) 50 MG tablet Take 2 tablets (100 mg total) by mouth every 8 (eight) hours. (Patient taking differently: Take 50 mg by mouth every 8 (eight) hours as needed (for pain). ) 15 tablet 0   No current facility-administered medications on file prior to visit.     Allergies  Allergen Reactions  . Phenergan [Promethazine Hcl] Swelling and Other (See Comments)    Cannot be combined with Zofran because swelling of the eyes resulted and patient became very restless-  . Zofran [Ondansetron Hcl] Swelling and Other (See Comments)    Cannot be combined with Phenergan because swelling of the eyes resulted and patient became very restless-  . Sulfamethoxazole Rash and Itching    Family History  Problem Relation  Age of Onset  . Heart disease Mother   . Heart attack Mother   . Stroke Mother   . Heart disease Father   . Heart attack Father   . Stroke Father   . Breast cancer Sister        Twin   . Heart attack Sister   . Dementia Sister   . Heart disease Sister   . Heart attack Sister   . Clotting disorder Sister   . Heart attack Sister   . Colon cancer Neg Hx     BP 132/84 (BP Location: Left Arm, Patient Position: Sitting, Cuff Size: Normal)   Pulse 72   Wt 179 lb 12.8 oz (81.6 kg)   SpO2 96%   BMI 28.16 kg/m   Review of Systems Denies falls.  He has leg cramps. No weight change.     Objective:   Physical Exam VITAL SIGNS:  See vs page GENERAL: no distress Gait: slow but steady. Ext: no leg edema  Lab Results  Component Value Date   PTH 39 07/09/2016   CALCIUM 8.8 (L) 07/28/2017   CAION 1.20 07/19/2017   Lab Results  Component Value Date   HGBA1C 6.1 (H) 07/20/2017       Assessment & Plan:  HTN: well-controlled, on reduced rx CVA: clinically improved.  F/u with neurol Hypocalcemia: recheck today Anemia: recheck today  Patient Instructions  blood tests are requested for you today.  We'll let you know about the results.   Please hold off on the lisinopril and lasix for now.  It is critically important to prevent falling down (keep floor areas well-lit, dry, and free of loose objects.  If you have a  cane, walker, or wheelchair, you should use it, even for short trips around the house.  Wear flat-soled shoes.  Also, try not to rush).   Please come back for a follow-up appointment in 2 months.

## 2017-08-06 ENCOUNTER — Telehealth: Payer: Self-pay | Admitting: Gastroenterology

## 2017-08-06 NOTE — Telephone Encounter (Signed)
Spoke to patient's wife, reviewed last office note from Dr. Havery Moros about recommendations for hemorrhoid treatment. Patient is still on Eliquis, went back over using Preparation H and/or hydrocortisone and glycerine suppositories. Patient may also try some Recticare topically. Patient's wife will try these suggestions and call back if needed.

## 2017-08-09 LAB — PTH, INTACT AND CALCIUM
CALCIUM: 9 mg/dL (ref 8.6–10.3)
PTH: 36 pg/mL (ref 14–64)

## 2017-08-10 ENCOUNTER — Encounter: Payer: Medicare Other | Attending: Registered Nurse | Admitting: Registered Nurse

## 2017-08-10 ENCOUNTER — Encounter: Payer: Self-pay | Admitting: Registered Nurse

## 2017-08-10 VITALS — BP 179/88 | HR 60 | Ht 67.0 in | Wt 177.0 lb

## 2017-08-10 DIAGNOSIS — F40240 Claustrophobia: Secondary | ICD-10-CM | POA: Insufficient documentation

## 2017-08-10 DIAGNOSIS — I129 Hypertensive chronic kidney disease with stage 1 through stage 4 chronic kidney disease, or unspecified chronic kidney disease: Secondary | ICD-10-CM | POA: Diagnosis not present

## 2017-08-10 DIAGNOSIS — E782 Mixed hyperlipidemia: Secondary | ICD-10-CM | POA: Insufficient documentation

## 2017-08-10 DIAGNOSIS — R2681 Unsteadiness on feet: Secondary | ICD-10-CM | POA: Diagnosis not present

## 2017-08-10 DIAGNOSIS — Z8249 Family history of ischemic heart disease and other diseases of the circulatory system: Secondary | ICD-10-CM | POA: Diagnosis not present

## 2017-08-10 DIAGNOSIS — K219 Gastro-esophageal reflux disease without esophagitis: Secondary | ICD-10-CM | POA: Insufficient documentation

## 2017-08-10 DIAGNOSIS — Z86711 Personal history of pulmonary embolism: Secondary | ICD-10-CM | POA: Diagnosis not present

## 2017-08-10 DIAGNOSIS — E1139 Type 2 diabetes mellitus with other diabetic ophthalmic complication: Secondary | ICD-10-CM | POA: Insufficient documentation

## 2017-08-10 DIAGNOSIS — Z8673 Personal history of transient ischemic attack (TIA), and cerebral infarction without residual deficits: Secondary | ICD-10-CM | POA: Diagnosis not present

## 2017-08-10 DIAGNOSIS — R2689 Other abnormalities of gait and mobility: Secondary | ICD-10-CM | POA: Diagnosis not present

## 2017-08-10 DIAGNOSIS — M810 Age-related osteoporosis without current pathological fracture: Secondary | ICD-10-CM | POA: Diagnosis not present

## 2017-08-10 DIAGNOSIS — I639 Cerebral infarction, unspecified: Secondary | ICD-10-CM | POA: Insufficient documentation

## 2017-08-10 DIAGNOSIS — C9211 Chronic myeloid leukemia, BCR/ABL-positive, in remission: Secondary | ICD-10-CM | POA: Diagnosis not present

## 2017-08-10 DIAGNOSIS — N189 Chronic kidney disease, unspecified: Secondary | ICD-10-CM | POA: Diagnosis not present

## 2017-08-10 DIAGNOSIS — G7000 Myasthenia gravis without (acute) exacerbation: Secondary | ICD-10-CM | POA: Diagnosis not present

## 2017-08-10 DIAGNOSIS — R0781 Pleurodynia: Secondary | ICD-10-CM | POA: Insufficient documentation

## 2017-08-10 DIAGNOSIS — G8191 Hemiplegia, unspecified affecting right dominant side: Secondary | ICD-10-CM | POA: Insufficient documentation

## 2017-08-10 DIAGNOSIS — I2510 Atherosclerotic heart disease of native coronary artery without angina pectoris: Secondary | ICD-10-CM | POA: Diagnosis not present

## 2017-08-10 DIAGNOSIS — H409 Unspecified glaucoma: Secondary | ICD-10-CM | POA: Diagnosis not present

## 2017-08-10 DIAGNOSIS — H42 Glaucoma in diseases classified elsewhere: Secondary | ICD-10-CM | POA: Insufficient documentation

## 2017-08-10 DIAGNOSIS — E1122 Type 2 diabetes mellitus with diabetic chronic kidney disease: Secondary | ICD-10-CM | POA: Insufficient documentation

## 2017-08-10 DIAGNOSIS — G4700 Insomnia, unspecified: Secondary | ICD-10-CM | POA: Insufficient documentation

## 2017-08-10 DIAGNOSIS — M6281 Muscle weakness (generalized): Secondary | ICD-10-CM | POA: Diagnosis not present

## 2017-08-10 NOTE — Progress Notes (Signed)
Subjective:    Patient ID: [name], male    DOB: 1940-10-17, 77 y.o.   MRN: [id number]  HPI: [name] is a 77 year old male who is here for transitional care visit in follow up of his subcortical infarction, right hemiparesis, chronic myeloid leukemia in remission and Type 2 DM. He has been home going to [place]. He states he has right rib pain. He rates his pain 2.   Mr. Miceli reports on Saturday he noticed his right eye was red, he states he will be seeing his opthomologist today.   Wife in room.   Pain Inventory Average Pain 2 Pain Right Now 2 My pain is irratation  In the last 24 hours, has pain interfered with the following? General activity 0 Relation with others 0 Enjoyment of life 0 What TIME of day is your pain at its worst? na Sleep (in general) Good  Pain is worse with: na Pain improves with: medication Relief from Meds: na  Mobility walk without assistance ability to climb steps?  yes do you drive?  no  Function retired  Neuro/Psych No problems in this area  Prior Studies Any changes since last visit?  no  Physicians involved in your care Any changes since last visit?  no   Family History  Problem Relation Age of Onset  . Heart disease Mother   . Heart attack Mother   . Stroke Mother   . Heart disease Father   . Heart attack Father   . Stroke Father   . Breast cancer Sister        Twin   . Heart attack Sister   . Dementia Sister   . Heart disease Sister   . Heart attack Sister   . Clotting disorder Sister   . Heart attack Sister   . Colon cancer Neg Hx    Social History   Socioeconomic History  . Marital status: Married    Spouse name: Hassan Rowan  . Number of children: 2  . Years of education: 1-College  . Highest education level: Not on file  Occupational History  . 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 Financial resource strain: Not on file  .  Food insecurity:    Worry: Not on file    Inability: Not on file  . Transportation needs:    Medical: Not on file    Non-medical: Not on file  Tobacco Use  . Smoking status: Never Smoker  . Smokeless tobacco: Never Used  Substance and Sexual Activity  . Alcohol use: No    Alcohol/week: 0.0 oz  . Drug use: No  . Sexual activity: Not Currently  Lifestyle  . Physical activity:    Days per week: Not on file    Minutes per session: Not on file  . Stress: Not on file  Relationships  . Social connections:    Talks on phone: Not on file    Gets together: Not on file    Attends religious service: Not on file    Active member of club or organization: Not on file    Attends meetings of clubs or organizations: Not on file    Relationship status: Not on file  Other Topics Concern  . Not on file  Social History Narrative   Lives at home w/ his wife   Patient is right handed.   Patient has 1-2 cups of caffeine  daily.   Past Surgical History:  Procedure Laterality Date  . ABDOMINAL AORTOGRAM W/LOWER EXTREMITY N/A 11/18/2016   Procedure: ABDOMINAL AORTOGRAM W/LOWER EXTREMITY;  Surgeon: [name], MD;  Location: [place] [facility];  Service: Cardiovascular;  Laterality: N/A;  . BACK SURGERY     x2, lumbar and cervical  . CARDIAC CATHETERIZATION    . CATARACT EXTRACTION Bilateral 12/12  . COLONOSCOPY    . ESOPHAGOGASTRODUODENOSCOPY ENDOSCOPY  04/2015  . IR GENERIC HISTORICAL  03/03/2016   IR IVC FILTER PLMT / S&I [name] GUID/MOD SED 03/03/2016 [name], MD [facility]  . IR GENERIC HISTORICAL  04/09/2016   IR RADIOLOGIST EVAL & MGMT 04/09/2016 [name], MD [facility]  . IR GENERIC HISTORICAL  04/30/2016   IR IVC FILTER RETRIEVAL / S&I [name] GUID/MOD SED 04/30/2016 [name], MD [facility]  . LITHOTRIPSY    . LUMBAR LAMINECTOMY/DECOMPRESSION MICRODISCECTOMY Right 09/14/2012   Procedure: Right Lumbar three-four, four-five, Lumbar five-Sacral one decompressive  laminectomy;  Surgeon: [name], MD;  Location: [place] [facility];  Service: Neurosurgery;  Laterality: Right;  . LUMBAR LAMINECTOMY/DECOMPRESSION MICRODISCECTOMY Left 03/11/2016   Procedure: Left Lumbar Two-ThreeMicrodiscectomy;  Surgeon: [name], MD;  Location: [place];  Service: Neurosurgery;  Laterality: Left;  . PERIPHERAL VASCULAR INTERVENTION  11/18/2016   Procedure: PERIPHERAL VASCULAR INTERVENTION;  Surgeon: [name], MD;  Location: [place] [facility];  Service: Cardiovascular;;  Left popliteal  . SHOULDER SURGERY Left 05/07/2009  . TONSILLECTOMY     Past Medical History:  Diagnosis Date  . Bruises easily    d/t being on Eliquis  . Carotid artery disease ([place])    a. Duplex 05/2014: 10-25% RICA, 8-52% LICA, elevated velocities in right subclavian artery, normal left subclavian artery..  . Chronic back pain    HNP  . Chronic kidney disease (CKD)   . Claustrophobia    takes Ativan as needed  . 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 Coronary atherosclerosis of unspecified type of vessel, native or graft    a. cath in 2003 showed 10-20% LM, scattered 20% prox-mid LAD, 30% more distal LAD, 50-60% stenosis of LAD towards apex, 20% Cx, 30% dRCA, focal 95% stenosis in smaller of 2 branches of PDA, EF 60-65%.  . Diabetes mellitus ([place])   . Diarrhea    takes Imodium daily as needed  . Diverticulosis   . Diverticulosis of colon (without mention of hemorrhage)   . DJD (degenerative joint disease)   . DVT (deep venous thrombosis) ([place]) 07/2011  . Esophageal stricture   . Essential hypertension    takes Imdur and Lisinopril daily  . Gastric polyps    benign  . GERD (gastroesophageal reflux disease)    takes Nexium daily  . Glaucoma    uses eye drops  . History of bronchitis    not sure when the last time  . History of colon polyps    benign  . History of kidney stones   . History of kidney stones   . Insomnia    takes Melatonin nightly as  needed  . Mixed hyperlipidemia    takes Crestor daily  . Myasthenia gravis ([place])    uses prednisone  . Osteoporosis 2016  . Peripheral edema    takes Lasix daily  . Pituitary tumor    checks Dr.Walden at [facility] checks it every Jan   . Pneumonia 01/2016  . PONV (postoperative nausea and vomiting)   .  Ptosis   . Pulmonary embolus ([place]) 2012  . Stroke ([place]) 06/2015   BP (!) 175/91   Pulse 60   Ht 5\' 7"  (1.702 m)   Wt 177 lb (80.3 kg)   SpO2 98%   BMI 27.72 kg/m   Opioid Risk Score:   Fall Risk Score:  `1  Depression screen PHQ 2/9  Depression screen PHQ 2/9 07/12/2015  Decreased Interest 0  Down, Depressed, Hopeless 0  PHQ - 2 Score 0  Altered sleeping 1  Tired, decreased energy 1  Change in appetite 0  Feeling bad or failure about yourself  0  Trouble concentrating 0  Moving slowly or fidgety/restless 0  Suicidal thoughts 0  PHQ-9 Score 2  Difficult doing work/chores Not difficult at all  Some recent data might be hidden     Review of Systems  Constitutional: Negative.   HENT: Negative.   Eyes: Positive for pain, discharge, redness and itching.  Respiratory: Negative.   Cardiovascular: Negative.   Gastrointestinal: Positive for diarrhea and nausea.  Endocrine: Negative.   Genitourinary: Negative.   Musculoskeletal: Positive for arthralgias.  Skin: Negative.   Allergic/Immunologic: Negative.   Neurological: Negative.   Hematological: Negative.   Psychiatric/Behavioral: Negative.   All other systems reviewed and are negative.      Objective:   Physical Exam  Constitutional: He is oriented to person, place, and time. He appears well-developed and well-nourished.  HENT:  Head: Normocephalic and atraumatic.  Eyes:  Right Eye Reddened  Neck: Normal range of motion. Neck supple.  Cardiovascular: Normal rate and regular rhythm.  Pulmonary/Chest: Effort normal and breath sounds normal.  Musculoskeletal:  Normal Muscle Bulk and Muscle Testing  Reveals: Upper Extremities: Full ROM and Muscle Strength on the Right 4/5 and Left 5/5 Thoracic Paraspinal Tenderness: T-3-T-4 Mainly Right Side Lower Extremities: Full ROM and Muscle Strength 5/5 Arises from Table with Ease Gait is Steady with Normal Steps  Neurological: He is alert and oriented to person, place, and time.  Skin: Skin is warm and dry.  Nursing note and vitals reviewed.         Assessment & Plan:  1. Subcortical Infarction/ Right Hemiparesis: Continue with Therapy   30 minutes of face to face patient care time was spent during this visit. All questions were encouraged and answered.  F/U in 4-6 weeks with Dr. Posey Pronto

## 2017-08-13 DIAGNOSIS — R2681 Unsteadiness on feet: Secondary | ICD-10-CM | POA: Diagnosis not present

## 2017-08-13 DIAGNOSIS — M6281 Muscle weakness (generalized): Secondary | ICD-10-CM | POA: Diagnosis not present

## 2017-08-13 DIAGNOSIS — R2689 Other abnormalities of gait and mobility: Secondary | ICD-10-CM | POA: Diagnosis not present

## 2017-08-17 DIAGNOSIS — M6281 Muscle weakness (generalized): Secondary | ICD-10-CM | POA: Diagnosis not present

## 2017-08-17 DIAGNOSIS — R2689 Other abnormalities of gait and mobility: Secondary | ICD-10-CM | POA: Diagnosis not present

## 2017-08-17 DIAGNOSIS — R2681 Unsteadiness on feet: Secondary | ICD-10-CM | POA: Diagnosis not present

## 2017-08-20 DIAGNOSIS — H401131 Primary open-angle glaucoma, bilateral, mild stage: Secondary | ICD-10-CM | POA: Diagnosis not present

## 2017-08-24 DIAGNOSIS — R2689 Other abnormalities of gait and mobility: Secondary | ICD-10-CM | POA: Diagnosis not present

## 2017-08-24 DIAGNOSIS — M6281 Muscle weakness (generalized): Secondary | ICD-10-CM | POA: Diagnosis not present

## 2017-08-24 DIAGNOSIS — R2681 Unsteadiness on feet: Secondary | ICD-10-CM | POA: Diagnosis not present

## 2017-08-26 ENCOUNTER — Other Ambulatory Visit (HOSPITAL_COMMUNITY): Payer: Self-pay | Admitting: Internal Medicine

## 2017-08-26 DIAGNOSIS — M6281 Muscle weakness (generalized): Secondary | ICD-10-CM | POA: Diagnosis not present

## 2017-08-26 DIAGNOSIS — R2681 Unsteadiness on feet: Secondary | ICD-10-CM | POA: Diagnosis not present

## 2017-08-26 DIAGNOSIS — N189 Chronic kidney disease, unspecified: Secondary | ICD-10-CM

## 2017-08-26 DIAGNOSIS — R2689 Other abnormalities of gait and mobility: Secondary | ICD-10-CM | POA: Diagnosis not present

## 2017-08-30 DIAGNOSIS — M7062 Trochanteric bursitis, left hip: Secondary | ICD-10-CM | POA: Diagnosis not present

## 2017-09-01 ENCOUNTER — Ambulatory Visit (HOSPITAL_COMMUNITY)
Admission: RE | Admit: 2017-09-01 | Discharge: 2017-09-01 | Disposition: A | Discharge status: Home / Self Care | Payer: Medicare Other | Source: Ambulatory Visit | Attending: Internal Medicine | Admitting: Internal Medicine

## 2017-09-01 DIAGNOSIS — N189 Chronic kidney disease, unspecified: Secondary | ICD-10-CM | POA: Insufficient documentation

## 2017-09-03 ENCOUNTER — Ambulatory Visit (HOSPITAL_BASED_OUTPATIENT_CLINIC_OR_DEPARTMENT_OTHER): Payer: Medicare Other | Admitting: Physical Medicine & Rehabilitation

## 2017-09-03 ENCOUNTER — Encounter: Payer: Self-pay | Admitting: Physical Medicine & Rehabilitation

## 2017-09-03 VITALS — BP 168/79 | HR 67 | Ht 67.0 in | Wt 180.0 lb

## 2017-09-03 DIAGNOSIS — M7062 Trochanteric bursitis, left hip: Secondary | ICD-10-CM

## 2017-09-03 DIAGNOSIS — I2510 Atherosclerotic heart disease of native coronary artery without angina pectoris: Secondary | ICD-10-CM | POA: Diagnosis not present

## 2017-09-03 DIAGNOSIS — R0781 Pleurodynia: Secondary | ICD-10-CM | POA: Diagnosis not present

## 2017-09-03 DIAGNOSIS — I639 Cerebral infarction, unspecified: Secondary | ICD-10-CM | POA: Diagnosis not present

## 2017-09-03 DIAGNOSIS — C9211 Chronic myeloid leukemia, BCR/ABL-positive, in remission: Secondary | ICD-10-CM | POA: Diagnosis not present

## 2017-09-03 DIAGNOSIS — G8191 Hemiplegia, unspecified affecting right dominant side: Secondary | ICD-10-CM | POA: Diagnosis not present

## 2017-09-03 DIAGNOSIS — Z8249 Family history of ischemic heart disease and other diseases of the circulatory system: Secondary | ICD-10-CM | POA: Diagnosis not present

## 2017-09-03 NOTE — Progress Notes (Signed)
Subjective:    Patient ID: [name], male    DOB: Apr 02, 1940, 77 y.o.   MRN: [id number] 77 year old history of peripheral arterial disease associated with type 2 diabetes, myasthenia gravis, history of pulmonary embolism on chronic Eliquis.  Prior history of chronic left cerebellar infarct.  Admitted 07/19/2017, no evidence of new stroke HPI 2 wk  Hx of left hip pain on left side, started after PT.  Seen by ortho Mod I dressing and bathing No vision problems  No falls Pain Inventory Average Pain 6 Pain Right Now 8 My pain is constant and sharp  In the last 24 hours, has pain interfered with the following? General activity 7 Relation with others 4 Enjoyment of life 6 What TIME of day is your pain at its worst? daytime Sleep (in general) Fair  Pain is worse with: walking Pain improves with: injections Relief from Meds: 6  Mobility ability to climb steps?  yes do you drive?  no  Function retired  Neuro/Psych trouble walking  Prior Studies Any changes since last visit?  no  Physicians involved in your care Any changes since last visit?  no   Family History  Problem Relation Age of Onset  . Heart disease Mother   . Heart attack Mother   . Stroke Mother   . Heart disease Father   . Heart attack Father   . Stroke Father   . Breast cancer Sister        Twin   . Heart attack Sister   . Dementia Sister   . Heart disease Sister   . Heart attack Sister   . Clotting disorder Sister   . Heart attack Sister   . Colon cancer Neg Hx    Social History   Socioeconomic History  . Marital status: Married    Spouse name: Hassan Rowan  . Number of children: 2  . Years of education: 1-College  . Highest education level: Not on file  Occupational History  . 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 Financial resource strain: Not on file  . Food insecurity:    Worry: Not on file    Inability: Not on file  . Transportation needs:      Medical: Not on file    Non-medical: Not on file  Tobacco Use  . Smoking status: Never Smoker  . Smokeless tobacco: Never Used  Substance and Sexual Activity  . Alcohol use: No    Alcohol/week: 0.0 oz  . Drug use: No  . Sexual activity: Not Currently  Lifestyle  . Physical activity:    Days per week: Not on file    Minutes per session: Not on file  . Stress: Not on file  Relationships  . Social connections:    Talks on phone: Not on file    Gets together: Not on file    Attends religious service: Not on file    Active member of club or organization: Not on file    Attends meetings of clubs or organizations: Not on file    Relationship status: Not on file  Other Topics Concern  . Not on file  Social History Narrative   Lives at home w/ his wife   Patient is right handed.   Patient has 1-2 cups of caffeine daily.   Past Surgical History:  Procedure Laterality Date  . ABDOMINAL AORTOGRAM W/LOWER EXTREMITY N/A 11/18/2016   Procedure: ABDOMINAL AORTOGRAM W/LOWER EXTREMITY;  Surgeon: [name], MD;  Location: [facility];  Service: Cardiovascular;  Laterality: N/A;  . BACK SURGERY     x2, lumbar and cervical  . CARDIAC CATHETERIZATION    . CATARACT EXTRACTION Bilateral 12/12  . COLONOSCOPY    . ESOPHAGOGASTRODUODENOSCOPY ENDOSCOPY  04/2015  . IR GENERIC HISTORICAL  03/03/2016   IR IVC FILTER PLMT / S&I [name] GUID/MOD SED 03/03/2016 [name], MD [facility]  . IR GENERIC HISTORICAL  04/09/2016   IR RADIOLOGIST EVAL & MGMT 04/09/2016 [name], MD [facility]  . IR GENERIC HISTORICAL  04/30/2016   IR IVC FILTER RETRIEVAL / S&I [name] GUID/MOD SED 04/30/2016 [name], MD [facility]  . LITHOTRIPSY    . LUMBAR LAMINECTOMY/DECOMPRESSION MICRODISCECTOMY Right 09/14/2012   Procedure: Right Lumbar three-four, four-five, Lumbar five-Sacral one decompressive laminectomy;  Surgeon: [name], MD;  Location: [facility];  Service: Neurosurgery;   Laterality: Right;  . LUMBAR LAMINECTOMY/DECOMPRESSION MICRODISCECTOMY Left 03/11/2016   Procedure: Left Lumbar Two-ThreeMicrodiscectomy;  Surgeon: [name], MD;  Location: [facility];  Service: Neurosurgery;  Laterality: Left;  . PERIPHERAL VASCULAR INTERVENTION  11/18/2016   Procedure: PERIPHERAL VASCULAR INTERVENTION;  Surgeon: [name], MD;  Location: [facility];  Service: Cardiovascular;;  Left popliteal  . SHOULDER SURGERY Left 05/07/2009  . TONSILLECTOMY     Past Medical History:  Diagnosis Date  . Bruises easily    d/t being on Eliquis  . Carotid artery disease ([place])    a. Duplex 05/2014: 23-53% RICA, 6-14% LICA, elevated velocities in right subclavian artery, normal left subclavian artery..  . Chronic back pain    HNP  . Chronic kidney disease (CKD)   . Claustrophobia    takes Ativan as needed  . 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 Coronary atherosclerosis of unspecified type of vessel, native or graft    a. cath in 2003 showed 10-20% LM, scattered 20% prox-mid LAD, 30% more distal LAD, 50-60% stenosis of LAD towards apex, 20% Cx, 30% dRCA, focal 95% stenosis in smaller of 2 branches of PDA, EF 60-65%.  . Diabetes mellitus ([place])   . Diarrhea    takes Imodium daily as needed  . Diverticulosis   . Diverticulosis of colon (without mention of hemorrhage)   . DJD (degenerative joint disease)   . DVT (deep venous thrombosis) ([place]) 07/2011  . Esophageal stricture   . Essential hypertension    takes Imdur and Lisinopril daily  . Gastric polyps    benign  . GERD (gastroesophageal reflux disease)    takes Nexium daily  . Glaucoma    uses eye drops  . History of bronchitis    not sure when the last time  . History of colon polyps    benign  . History of kidney stones   . History of kidney stones   . Insomnia    takes Melatonin nightly as needed  . Mixed hyperlipidemia    takes Crestor daily  . Myasthenia gravis ([place])    uses  prednisone  . Osteoporosis 2016  . Peripheral edema    takes Lasix daily  . Pituitary tumor    [facility]den at [facility] checks it every Jan   . Pneumonia 01/2016  . PONV (postoperative nausea and vomiting)   . Ptosis   . Pulmonary embolus ([place]) 2012  . Stroke ([place]) 06/2015   BP (!) 168/79   Pulse 67  Ht 5\' 7"  (1.702 m)   Wt 180 lb (81.6 kg)   SpO2 97%   BMI 28.19 kg/m   Opioid Risk Score:   Fall Risk Score:  `1  Depression screen PHQ 2/9  Depression screen PHQ 2/9 07/12/2015  Decreased Interest 0  Down, Depressed, Hopeless 0  PHQ - 2 Score 0  Altered sleeping 1  Tired, decreased energy 1  Change in appetite 0  Feeling bad or failure about yourself  0  Trouble concentrating 0  Moving slowly or fidgety/restless 0  Suicidal thoughts 0  PHQ-9 Score 2  Difficult doing work/chores Not difficult at all  Some recent data might be hidden     Review of Systems  Constitutional: Negative.   HENT: Negative.   Eyes: Negative.   Respiratory: Negative.   Cardiovascular: Negative.   Gastrointestinal: Negative.   Endocrine: Negative.   Genitourinary: Negative.   Musculoskeletal: Positive for arthralgias, gait problem, joint swelling and myalgias.  Skin: Negative.   Allergic/Immunologic: Negative.   Hematological: Negative.   Psychiatric/Behavioral: Negative.   All other systems reviewed and are negative.      Objective:   Physical Exam  Constitutional: He is oriented to person, place, and time. He appears well-developed and well-nourished.  HENT:  Head: Normocephalic and atraumatic.  Neurological: He is alert and oriented to person, place, and time.  Skin: Skin is warm and dry.  Psychiatric: He has a normal mood and affect.  Nursing note and vitals reviewed.  Mild dysmetria left finger-nose-finger mild dysdiadochokinesis rapid alternating supination pronation left upper extremity. Motor strength is 5/5 bilateral deltoid bicep tricep grip hip flexor knee extensor  ankle dorsiflexor and plantar flexor Gait slightly wide-based no assistive device no evidence of toe drag or knee instability. Visual fields are intact confrontation testing Extraocular muscles are intact no evidence of nystagmus  Left hip pain tenderness over the greater trochanter.  No pain with hip range of motion.     Assessment & Plan:  1.  Chronic left cerebellar infarct mild residual ataxia left upper limb.  Overall with excellent recovery.  He is independent with all self-care and mobility May resume driving No need for PT OT follow-up 2.  Left hip trochanteric bursitis.  Has had only one day relief with hip bursa injection performed by orthopedics. Will trial Flector patch may cut into strips in place over the left hip every 12 hours Patient is to call if pain does not subside.  We can potentially perform ultrasound-guided left trochanteric bursa injection.  He may need posterior or anterior facet injected.

## 2017-09-03 NOTE — Patient Instructions (Addendum)

## 2017-09-07 DIAGNOSIS — M25552 Pain in left hip: Secondary | ICD-10-CM | POA: Diagnosis not present

## 2017-09-07 DIAGNOSIS — M7062 Trochanteric bursitis, left hip: Secondary | ICD-10-CM | POA: Diagnosis not present

## 2017-09-09 DIAGNOSIS — M25552 Pain in left hip: Secondary | ICD-10-CM | POA: Diagnosis not present

## 2017-09-09 DIAGNOSIS — M7062 Trochanteric bursitis, left hip: Secondary | ICD-10-CM | POA: Diagnosis not present

## 2017-09-16 ENCOUNTER — Encounter: Payer: Medicare Other | Attending: Registered Nurse

## 2017-09-16 ENCOUNTER — Ambulatory Visit (HOSPITAL_BASED_OUTPATIENT_CLINIC_OR_DEPARTMENT_OTHER): Payer: Medicare Other | Admitting: Physical Medicine & Rehabilitation

## 2017-09-16 ENCOUNTER — Encounter: Payer: Self-pay | Admitting: Physical Medicine & Rehabilitation

## 2017-09-16 ENCOUNTER — Other Ambulatory Visit: Payer: Self-pay

## 2017-09-16 VITALS — BP 170/80 | HR 76 | Resp 98 | Ht 67.0 in | Wt 179.6 lb

## 2017-09-16 DIAGNOSIS — Z8249 Family history of ischemic heart disease and other diseases of the circulatory system: Secondary | ICD-10-CM | POA: Insufficient documentation

## 2017-09-16 DIAGNOSIS — E1139 Type 2 diabetes mellitus with other diabetic ophthalmic complication: Secondary | ICD-10-CM | POA: Insufficient documentation

## 2017-09-16 DIAGNOSIS — F40240 Claustrophobia: Secondary | ICD-10-CM | POA: Insufficient documentation

## 2017-09-16 DIAGNOSIS — C9211 Chronic myeloid leukemia, BCR/ABL-positive, in remission: Secondary | ICD-10-CM | POA: Insufficient documentation

## 2017-09-16 DIAGNOSIS — H409 Unspecified glaucoma: Secondary | ICD-10-CM | POA: Insufficient documentation

## 2017-09-16 DIAGNOSIS — K219 Gastro-esophageal reflux disease without esophagitis: Secondary | ICD-10-CM | POA: Diagnosis not present

## 2017-09-16 DIAGNOSIS — I639 Cerebral infarction, unspecified: Secondary | ICD-10-CM

## 2017-09-16 DIAGNOSIS — G8191 Hemiplegia, unspecified affecting right dominant side: Secondary | ICD-10-CM | POA: Diagnosis not present

## 2017-09-16 DIAGNOSIS — Z86711 Personal history of pulmonary embolism: Secondary | ICD-10-CM | POA: Diagnosis not present

## 2017-09-16 DIAGNOSIS — E1122 Type 2 diabetes mellitus with diabetic chronic kidney disease: Secondary | ICD-10-CM | POA: Diagnosis not present

## 2017-09-16 DIAGNOSIS — G7000 Myasthenia gravis without (acute) exacerbation: Secondary | ICD-10-CM | POA: Diagnosis not present

## 2017-09-16 DIAGNOSIS — M7062 Trochanteric bursitis, left hip: Secondary | ICD-10-CM | POA: Diagnosis not present

## 2017-09-16 DIAGNOSIS — H42 Glaucoma in diseases classified elsewhere: Secondary | ICD-10-CM | POA: Diagnosis not present

## 2017-09-16 DIAGNOSIS — R0781 Pleurodynia: Secondary | ICD-10-CM | POA: Insufficient documentation

## 2017-09-16 DIAGNOSIS — Z8673 Personal history of transient ischemic attack (TIA), and cerebral infarction without residual deficits: Secondary | ICD-10-CM | POA: Diagnosis not present

## 2017-09-16 DIAGNOSIS — I129 Hypertensive chronic kidney disease with stage 1 through stage 4 chronic kidney disease, or unspecified chronic kidney disease: Secondary | ICD-10-CM | POA: Insufficient documentation

## 2017-09-16 DIAGNOSIS — N189 Chronic kidney disease, unspecified: Secondary | ICD-10-CM | POA: Insufficient documentation

## 2017-09-16 DIAGNOSIS — I2510 Atherosclerotic heart disease of native coronary artery without angina pectoris: Secondary | ICD-10-CM | POA: Insufficient documentation

## 2017-09-16 DIAGNOSIS — M810 Age-related osteoporosis without current pathological fracture: Secondary | ICD-10-CM | POA: Insufficient documentation

## 2017-09-16 DIAGNOSIS — G4700 Insomnia, unspecified: Secondary | ICD-10-CM | POA: Diagnosis not present

## 2017-09-16 DIAGNOSIS — E782 Mixed hyperlipidemia: Secondary | ICD-10-CM | POA: Insufficient documentation

## 2017-09-16 NOTE — Progress Notes (Signed)
Subjective:    Patient ID: [name], male    DOB: 1940-09-18, 77 y.o.   MRN: [id number] 77 year old history of peripheral arterial disease associated with type 2 diabetes, myasthenia gravis, history of pulmonary embolism on chronic Eliquis.  Prior history of chronic left cerebellar infarct.  Admitted 07/19/2017, no evidence of new stroke HPI 77 year old male with history of CVA causing gait disorder.  He has a prior history of left hip pain and has in the past had MRI of the hip showing gluteus medius tear.  He is followed up with his orthopedist Dr. Lanae Crumbly 3 who was performed another MRI.  According to the patient this is also demonstrated a tear of 1 of his tendons or may be even 2 of them. He continues to have lateral hip pain.  He has had no falls or trauma.  Dr. [place] Lighter has infiltrated the area with lidocaine as well as corticosteroid.  This has not provided any significant relief.  We tried Flector patches last visit which were not particularly effective.  The patient tried some oxycodone which did not make him feel good.  He is tried some tramadol in the past which was partially helpful He is ambulating with a cane. Pain Inventory Average Pain 9 Pain Right Now 9 My pain is constant and sharp  In the last 24 hours, has pain interfered with the following? General activity 8 Relation with others 8 Enjoyment of life 8 What TIME of day is your pain at its worst? daytime Sleep (in general) Poor  Pain is worse with: walking and standing Pain improves with: medication Relief from Meds: 3  Mobility use a cane do you drive?  yes  Function not employed: date last employed n/a  Neuro/Psych trouble walking  Prior Studies Any changes since last visit?  no  Physicians involved in your care Any changes since last visit?  no   Family History  Problem Relation Age of Onset  . Heart disease Mother   . Heart attack Mother   . Stroke Mother   . Heart disease Father   . Heart  attack Father   . Stroke Father   . Breast cancer Sister        Twin   . Heart attack Sister   . Dementia Sister   . Heart disease Sister   . Heart attack Sister   . Clotting disorder Sister   . Heart attack Sister   . Colon cancer Neg Hx    Social History   Socioeconomic History  . Marital status: Married    Spouse name: Hassan Rowan  . Number of children: 2  . Years of education: 1-College  . Highest education level: Not on file  Occupational History  . 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 Financial resource strain: Not on file  . Food insecurity:    Worry: Not on file    Inability: Not on file  . Transportation needs:    Medical: Not on file    Non-medical: Not on file  Tobacco Use  . Smoking status: Never Smoker  . Smokeless tobacco: Never Used  Substance and Sexual Activity  . Alcohol use: No    Alcohol/week: 0.0 standard drinks  . Drug use: No  . Sexual activity: Not Currently  Lifestyle  . Physical activity:    Days per week: Not on file    Minutes per session: Not on file  . Stress: Not  on file  Relationships  . Social connections:    Talks on phone: Not on file    Gets together: Not on file    Attends religious service: Not on file    Active member of club or organization: Not on file    Attends meetings of clubs or organizations: Not on file    Relationship status: Not on file  Other Topics Concern  . Not on file  Social History Narrative   Lives at home w/ his wife   Patient is right handed.   Patient has 1-2 cups of caffeine daily.   Past Surgical History:  Procedure Laterality Date  . ABDOMINAL AORTOGRAM W/LOWER EXTREMITY N/A 11/18/2016   Procedure: ABDOMINAL AORTOGRAM W/LOWER EXTREMITY;  Surgeon: [name], MD;  Location: [facility];  Service: Cardiovascular;  Laterality: N/A;  . BACK SURGERY     x2, lumbar and cervical  . CARDIAC CATHETERIZATION    . CATARACT EXTRACTION Bilateral 12/12   . COLONOSCOPY    . ESOPHAGOGASTRODUODENOSCOPY ENDOSCOPY  04/2015  . IR GENERIC HISTORICAL  03/03/2016   IR IVC FILTER PLMT / S&I [name] GUID/MOD SED 03/03/2016 [name], MD [facility]  . IR GENERIC HISTORICAL  04/09/2016   IR RADIOLOGIST EVAL & MGMT 04/09/2016 [name], MD [facility]  . IR GENERIC HISTORICAL  04/30/2016   IR IVC FILTER RETRIEVAL / S&I [name] GUID/MOD SED 04/30/2016 [name], MD [facility]  . LITHOTRIPSY    . LUMBAR LAMINECTOMY/DECOMPRESSION MICRODISCECTOMY Right 09/14/2012   Procedure: Right Lumbar three-four, four-five, Lumbar five-Sacral one decompressive laminectomy;  Surgeon: [name], MD;  Location: [facility];  Service: Neurosurgery;  Laterality: Right;  . LUMBAR LAMINECTOMY/DECOMPRESSION MICRODISCECTOMY Left 03/11/2016   Procedure: Left Lumbar Two-ThreeMicrodiscectomy;  Surgeon: [name], MD;  Location: [facility];  Service: Neurosurgery;  Laterality: Left;  . PERIPHERAL VASCULAR INTERVENTION  11/18/2016   Procedure: PERIPHERAL VASCULAR INTERVENTION;  Surgeon: [name], MD;  Location: [facility];  Service: Cardiovascular;;  Left popliteal  . SHOULDER SURGERY Left 05/07/2009  . TONSILLECTOMY     Past Medical History:  Diagnosis Date  . Bruises easily    d/t being on Eliquis  . Carotid artery disease ([place])    a. Duplex 05/2014: 27-06% RICA, 2-37% LICA, elevated velocities in right subclavian artery, normal left subclavian artery..  . Chronic back pain    HNP  . Chronic kidney disease (CKD)   . Claustrophobia    takes Ativan as needed  . CML (chronic myeloid leukemia) ([place])    takes Gleevec daily  . Coronary atherosclerosis of unspecified type of vessel, native or graft    a. cath in 2003 showed 10-20% LM, scattered 20% prox-mid LAD, 30% more distal LAD, 50-60% stenosis of LAD towards apex, 20% Cx, 30% dRCA, focal 95% stenosis in smaller of 2 branches of PDA, EF 60-65%.  . Diabetes mellitus ([place])   . Diarrhea    takes  Imodium daily as needed  . Diverticulosis   . Diverticulosis of colon (without mention of hemorrhage)   . DJD (degenerative joint disease)   . DVT (deep venous thrombosis) ([place]) 07/2011  . Esophageal stricture   . Essential hypertension    takes Imdur and Lisinopril daily  . Gastric polyps    benign  . GERD (gastroesophageal reflux disease)    takes Nexium daily  . Glaucoma    uses eye drops  . History of bronchitis    not sure  when the last time  . History of colon polyps    benign  . History of kidney stones   . History of kidney stones   . Insomnia    takes Melatonin nightly as needed  . Mixed hyperlipidemia    takes Crestor daily  . Myasthenia gravis ([place])    uses prednisone  . Osteoporosis 2016  . Peripheral edema    takes Lasix daily  . Pituitary tumor    checks [facility]Walden at [facility] checks it every Jan   . Pneumonia 01/2016  . PONV (postoperative nausea and vomiting)   . Ptosis   . Pulmonary embolus ([place]) 2012  . Stroke ([place]) 06/2015   BP (!) 170/80   Pulse 76   Resp (!) 98   Ht 5\' 7"  (1.702 m)   Wt 179 lb 9.6 oz (81.5 kg)   BMI 28.13 kg/m   Opioid Risk Score:   Fall Risk Score:  `1  Depression screen PHQ 2/9  Depression screen [facility] 2/9 09/16/2017 07/12/2015  Decreased Interest 0 0  Down, Depressed, Hopeless 0 0  PHQ - 2 Score 0 0  Altered sleeping - 1  Tired, decreased energy - 1  Change in appetite - 0  Feeling bad or failure about yourself  - 0  Trouble concentrating - 0  Moving slowly or fidgety/restless - 0  Suicidal thoughts - 0  PHQ-9 Score - 2  Difficult doing work/chores - Not difficult at all  Some recent data might be hidden   Review of Systems  Constitutional: Negative.   HENT: Negative.   Eyes: Negative.   Respiratory: Negative.   Cardiovascular: Negative.   Gastrointestinal: Negative.   Endocrine: Negative.   Genitourinary: Negative.   Musculoskeletal: Negative.   Skin: Negative.   Allergic/Immunologic: Negative.     Neurological: Negative.   Hematological: Negative.   Psychiatric/Behavioral: Negative.   All other systems reviewed and are negative.      Objective:   Physical Exam Examination well-developed well-nourished male no acute distress Mood and affect mildly anxious His hip has no pain with range of motion with external rotation mild pain with internal rotation left side only.  He has tenderness palpation of the left lateral hip not on the right side. Negative straight leg raise Knee no evidence of He ambulates with a cane he has small step length widened base of support       Assessment & Plan:  1.  Left hip pain with prior history of gluteus medius tear.  Sounds like he has had a recurrent problem with this.  He follows with his orthopedic surgeon At this point I advised that he uses a walker for the next six wks to avoid further strain to the tendon and follow up with ortho for next steps  He may need additional PT after tendon healing has occurred  PMR f/u prn

## 2017-09-16 NOTE — Patient Instructions (Signed)
Recommend using walker for the next 6 wk to let the tear heal  Then see Dr [place] Lighter

## 2017-09-20 DIAGNOSIS — Z125 Encounter for screening for malignant neoplasm of prostate: Secondary | ICD-10-CM | POA: Diagnosis not present

## 2017-09-24 DIAGNOSIS — M7062 Trochanteric bursitis, left hip: Secondary | ICD-10-CM | POA: Diagnosis not present

## 2017-09-24 DIAGNOSIS — M5136 Other intervertebral disc degeneration, lumbar region: Secondary | ICD-10-CM | POA: Diagnosis not present

## 2017-09-25 ENCOUNTER — Ambulatory Visit (INDEPENDENT_AMBULATORY_CARE_PROVIDER_SITE_OTHER): Payer: Medicare Other | Admitting: Family Medicine

## 2017-09-25 ENCOUNTER — Encounter: Payer: Self-pay | Admitting: Family Medicine

## 2017-09-25 VITALS — BP 130/80 | HR 75 | Temp 98.6°F | Resp 16 | Ht 67.0 in | Wt 178.0 lb

## 2017-09-25 DIAGNOSIS — L03111 Cellulitis of right axilla: Secondary | ICD-10-CM

## 2017-09-25 DIAGNOSIS — J340 Abscess, furuncle and carbuncle of nose: Secondary | ICD-10-CM | POA: Insufficient documentation

## 2017-09-25 DIAGNOSIS — D899 Disorder involving the immune mechanism, unspecified: Secondary | ICD-10-CM

## 2017-09-25 DIAGNOSIS — E1159 Type 2 diabetes mellitus with other circulatory complications: Secondary | ICD-10-CM

## 2017-09-25 DIAGNOSIS — D849 Immunodeficiency, unspecified: Secondary | ICD-10-CM | POA: Diagnosis not present

## 2017-09-25 DIAGNOSIS — I639 Cerebral infarction, unspecified: Secondary | ICD-10-CM | POA: Diagnosis not present

## 2017-09-25 LAB — GLUCOSE, POCT (MANUAL RESULT ENTRY): POC GLUCOSE: 232 mg/dL — AB (ref 70–99)

## 2017-09-25 MED ORDER — DOXYCYCLINE HYCLATE 100 MG PO TABS: 100.0000 mg | ORAL_TABLET | Freq: Two times a day (BID) | ORAL | 0 refills | Status: DC

## 2017-09-25 MED ORDER — CEFTRIAXONE SODIUM 1 G IJ SOLR
1.0000 g | Freq: Once | INTRAMUSCULAR | Status: AC
  Administered 2017-09-25: 1 g via INTRAMUSCULAR

## 2017-09-25 NOTE — Addendum Note (Signed)
Addended by: Jiles Prows on: 09/25/2017 12:10 PM   Modules accepted: Orders

## 2017-09-25 NOTE — Assessment & Plan Note (Addendum)
Previosuly well controlled but now on prednsione higher dose with hip and back pain.  Now 232  Nonfasting.. Will need to be address possibly with med restart while on prednisone higher dose.  He will start with low carb diet and increase water intake some over the weekend.

## 2017-09-25 NOTE — Assessment & Plan Note (Addendum)
Unable to cultured area ( could not find swab at Sat clinic). Will treat with antibiotics as discussed for axillae cellulitis.  Nasal saline irrigation and warm compresses

## 2017-09-25 NOTE — Assessment & Plan Note (Signed)
No focal  Fluctuant area to I and D, no emergent symtpoms that warrant ER visit at this time. Return precautions given and reviewed closely.. Treat with IM ceftriaxone and oral doxy ( cover for MRSA given recent admission), close follow up in 2 days with PCP for consideration of I and D if not improving.

## 2017-09-25 NOTE — Patient Instructions (Addendum)
Complete antibiotic course.  Start warm compresses 3-4 times daily.  Can do nasal saline for nasal sore.  Go to ER if  pain not controlled, redness spreading, fever or not keeping down oral antibiotics.  Close follow up on Monday with primary care doctor! He will start with low carb diet and increase water intake some over the weekend.

## 2017-09-25 NOTE — Assessment & Plan Note (Signed)
Has DM, on prednisone and chemo for  CML.

## 2017-09-25 NOTE — Progress Notes (Signed)
   Subjective:    Patient ID: [name], male    DOB: 1940/08/27, 77 y.o.   MRN: [id number]  HPI    77 year old male pt of Dr. Loanne Drilling with history of CAD,  CVA, CHF, CML in remission ( on daily chemo), PAD Hx of DVT and diabetes presents with 2 new issues.  1. Swelling in right axillae x 24 hours. Raw red area, no discharge.  No fever noted. No SOB, no CP.  Has felt   2. Soreness in left nostril x 3 weeks,  On lateral internal nostril.. Bump noted inside and redness on outside  Has been applying vicks vapor rub and alcohol.  presetns in nostril.  He is daily prednisone for myasthenia gravis, on increased dose lately in last few weeks with hip bursitis.  No recent antibiotics. No hx of boils. No MRSA personal or family.   He immunocompromised.   Using 1 tab every eight hours for pain.  Blood pressure 130/80, pulse 75, temperature 98.6 F (37 C), temperature source Oral, resp. rate 16, height 5\' 7"  (1.702 m), weight 178 lb (80.7 kg), SpO2 98 %. Social History /Family History/Past Medical History reviewed in detail and updated in EMR if needed.  Lab Results  Component Value Date   HGBA1C 6.1 (H) 07/20/2017     Review of Systems  Constitutional: Negative for fatigue and fever.  HENT: Negative for ear pain.   Eyes: Negative for pain.  Respiratory: Negative for cough and shortness of breath.   Cardiovascular: Negative for chest pain, palpitations and leg swelling.  Gastrointestinal: Negative for abdominal pain.  Genitourinary: Negative for dysuria.  Musculoskeletal: Negative for arthralgias.  Neurological: Negative for syncope, light-headedness and headaches.  Psychiatric/Behavioral: Negative for dysphoric mood.       Objective:   Physical Exam  Constitutional: Vital signs are normal. He appears well-developed and well-nourished.  HENT:  Head: Normocephalic.  Right Ear: Hearing normal.  Left Ear: Hearing normal.  Nose: Nose normal.  Mouth/Throat: Oropharynx is  clear and moist and mucous membranes are normal.  Left lateral nare with erythema,tt and swelling.   right axilalle with erythema, tenderness 6 x 4 cm, no fluctuance.. Circled with sharpe.   Neck: Trachea normal. Carotid bruit is not present. No thyroid mass and no thyromegaly present.  Cardiovascular: Normal rate, regular rhythm and normal pulses. Exam reveals no gallop, no distant heart sounds and no friction rub.  No murmur heard. No peripheral edema  Pulmonary/Chest: Effort normal and breath sounds normal. No respiratory distress.  Skin: Skin is warm, dry and intact. No rash noted.  Psychiatric: He has a normal mood and affect. His speech is normal and behavior is normal. Thought content normal.          Assessment & Plan:

## 2017-09-27 ENCOUNTER — Encounter: Payer: Self-pay | Admitting: Endocrinology

## 2017-09-27 ENCOUNTER — Ambulatory Visit (INDEPENDENT_AMBULATORY_CARE_PROVIDER_SITE_OTHER): Payer: Medicare Other | Admitting: Endocrinology

## 2017-09-27 VITALS — BP 178/87 | HR 78 | Temp 98.3°F | Ht 67.0 in | Wt 178.0 lb

## 2017-09-27 DIAGNOSIS — I2510 Atherosclerotic heart disease of native coronary artery without angina pectoris: Secondary | ICD-10-CM

## 2017-09-27 DIAGNOSIS — D649 Anemia, unspecified: Secondary | ICD-10-CM

## 2017-09-27 DIAGNOSIS — E782 Mixed hyperlipidemia: Secondary | ICD-10-CM | POA: Diagnosis not present

## 2017-09-27 DIAGNOSIS — I10 Essential (primary) hypertension: Secondary | ICD-10-CM

## 2017-09-27 DIAGNOSIS — I639 Cerebral infarction, unspecified: Secondary | ICD-10-CM | POA: Diagnosis not present

## 2017-09-27 DIAGNOSIS — E1159 Type 2 diabetes mellitus with other circulatory complications: Secondary | ICD-10-CM | POA: Diagnosis not present

## 2017-09-27 LAB — CBC WITH DIFFERENTIAL/PLATELET
BASOS PCT: 0.3 % (ref 0.0–3.0)
Basophils Absolute: 0 10*3/uL (ref 0.0–0.1)
Eosinophils Absolute: 0 10*3/uL (ref 0.0–0.7)
Eosinophils Relative: 0.1 % (ref 0.0–5.0)
HEMATOCRIT: 36.4 % — AB (ref 39.0–52.0)
HEMOGLOBIN: 12.3 g/dL — AB (ref 13.0–17.0)
LYMPHS PCT: 5.5 % — AB (ref 12.0–46.0)
Lymphs Abs: 0.6 10*3/uL — ABNORMAL LOW (ref 0.7–4.0)
MCHC: 33.7 g/dL (ref 30.0–36.0)
MCV: 98.7 fl (ref 78.0–100.0)
Monocytes Absolute: 0.7 10*3/uL (ref 0.1–1.0)
Monocytes Relative: 6.6 % (ref 3.0–12.0)
Neutro Abs: 9.4 10*3/uL — ABNORMAL HIGH (ref 1.4–7.7)
Neutrophils Relative %: 87.5 % — ABNORMAL HIGH (ref 43.0–77.0)
Platelets: 73 10*3/uL — ABNORMAL LOW (ref 150.0–400.0)
RBC: 3.69 Mil/uL — AB (ref 4.22–5.81)
RDW: 14.3 % (ref 11.5–15.5)
WBC: 10.7 10*3/uL — AB (ref 4.0–10.5)

## 2017-09-27 LAB — BASIC METABOLIC PANEL
BUN: 20 mg/dL (ref 6–23)
CHLORIDE: 101 meq/L (ref 96–112)
CO2: 31 mEq/L (ref 19–32)
Calcium: 8.9 mg/dL (ref 8.4–10.5)
Creatinine, Ser: 1.35 mg/dL (ref 0.40–1.50)
GFR: 54.13 mL/min — ABNORMAL LOW (ref >60.00)
Glucose, Bld: 123 mg/dL — ABNORMAL HIGH (ref 70–99)
Potassium: 4.6 mEq/L (ref 3.5–5.1)
SODIUM: 136 meq/L (ref 135–145)

## 2017-09-27 LAB — IBC PANEL
Iron: 135 ug/dL (ref 42–165)
SATURATION RATIOS: 47.7 % (ref 20.0–50.0)
Transferrin: 202 mg/dL — ABNORMAL LOW (ref 212.0–360.0)

## 2017-09-27 LAB — TSH: TSH: 0.51 u[IU]/mL (ref 0.35–4.50)

## 2017-09-27 MED ORDER — FUROSEMIDE 20 MG PO TABS: 20.0000 mg | ORAL_TABLET | Freq: Every day | ORAL | 3 refills | Status: DC

## 2017-09-27 MED ORDER — LISINOPRIL 10 MG PO TABS: 10.0000 mg | ORAL_TABLET | Freq: Every day | ORAL | 11 refills | Status: DC

## 2017-09-27 NOTE — Progress Notes (Signed)
Subjective:    Patient ID: [name], male    DOB: Jul 22, 1940, 77 y.o.   MRN: [id number]  HPI Few days of moderate pain at the right axilla, but no assoc fever.  Dr Diona Browner rx'ed doxycycline.  Since then, no change in pain. Past Medical History:  Diagnosis Date  . Bruises easily    d/t being on Eliquis  . Carotid artery disease ([place])    a. Duplex 05/2014: 62-95% RICA, 2-84% LICA, elevated velocities in right subclavian artery, normal left subclavian artery..  . Chronic back pain    HNP  . Chronic kidney disease (CKD)   . Claustrophobia    takes Ativan as needed  . 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 Coronary atherosclerosis of unspecified type of vessel, native or graft    a. cath in 2003 showed 10-20% LM, scattered 20% prox-mid LAD, 30% more distal LAD, 50-60% stenosis of LAD towards apex, 20% Cx, 30% dRCA, focal 95% stenosis in smaller of 2 branches of PDA, EF 60-65%.  . Diabetes mellitus ([place])   . Diarrhea    takes Imodium daily as needed  . Diverticulosis   . Diverticulosis of colon (without mention of hemorrhage)   . DJD (degenerative joint disease)   . DVT (deep venous thrombosis) ([place]) 07/2011  . Esophageal stricture   . Essential hypertension    takes Imdur and Lisinopril daily  . Gastric polyps    benign  . GERD (gastroesophageal reflux disease)    takes Nexium daily  . Glaucoma    uses eye drops  . History of bronchitis    not sure when the last time  . History of colon polyps    benign  . History of kidney stones   . History of kidney stones   . Insomnia    takes Melatonin nightly as needed  . Mixed hyperlipidemia    takes Crestor daily  . Myasthenia gravis ([place])    uses prednisone  . Osteoporosis 2016  . Peripheral edema    takes Lasix daily  . Pituitary tumor    checks Dr[facility]Walden at [facility] checks it every Jan   . Pneumonia 01/2016  . PONV (postoperative nausea and vomiting)   . Ptosis   . Pulmonary embolus ([place])  2012  . Stroke [facility]) 06/2015    Past Surgical History:  Procedure Laterality Date  . ABDOMINAL AORTOGRAM W/LOWER EXTREMITY N/A 11/18/2016   Procedure: ABDOMINAL AORTOGRAM W/LOWER EXTREMITY;  Surgeon: [name], MD;  Location: [place] [facility];  Service: Cardiovascular;  Laterality: N/A;  . BACK SURGERY     x2, lumbar and cervical  . CARDIAC CATHETERIZATION    . CATARACT EXTRACTION Bilateral 12/12  . COLONOSCOPY    . ESOPHAGOGASTRODUODENOSCOPY ENDOSCOPY  04/2015  . IR GENERIC HISTORICAL  03/03/2016   IR IVC FILTER PLMT / S&I [name] GUID/MOD SED 03/03/2016 [name], MD [facility]  . IR GENERIC HISTORICAL  04/09/2016   IR RADIOLOGIST EVAL & MGMT 04/09/2016 [name], MD [facility]  . IR GENERIC HISTORICAL  04/30/2016   IR IVC FILTER RETRIEVAL / S&I [name] GUID/MOD SED 04/30/2016 [name], MD [facility]INTER[facility]  . LITHOTRIPSY    . LUMBAR LAMINECTOMY/DECOMPRESSION MICRODISCECTOMY Right 09/14/2012   Procedure: Right Lumbar three-four, four-five, Lumbar five-Sacral one decompressive laminectomy;  Surgeon: [name], MD;  Location: [place] [facility];  Service: Neurosurgery;  Laterality: Right;  . LUMBAR LAMINECTOMY/DECOMPRESSION MICRODISCECTOMY Left 03/11/2016   Procedure:  Left Lumbar Two-ThreeMicrodiscectomy;  Surgeon: [name], MD;  Location: [place];  Service: Neurosurgery;  Laterality: Left;  . PERIPHERAL VASCULAR INTERVENTION  11/18/2016   Procedure: PERIPHERAL VASCULAR INTERVENTION;  Surgeon: [name], MD;  Location: [place] [facility];  Service: Cardiovascular;;  Left popliteal  . SHOULDER SURGERY Left 05/07/2009  . TONSILLECTOMY      Social History   Socioeconomic History  . Marital status: Married    Spouse name: Hassan Rowan  . Number of children: 2  . Years of education: 1-College  . Highest education level: Not on file  Occupational History  . 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 Financial resource strain: Not  on file  . Food insecurity:    Worry: Not on file    Inability: Not on file  . Transportation needs:    Medical: Not on file    Non-medical: Not on file  Tobacco Use  . Smoking status: Never Smoker  . Smokeless tobacco: Never Used  Substance and Sexual Activity  . Alcohol use: No    Alcohol/week: 0.0 standard drinks  . Drug use: No  . Sexual activity: Not Currently  Lifestyle  . Physical activity:    Days per week: Not on file    Minutes per session: Not on file  . Stress: Not on file  Relationships  . Social connections:    Talks on phone: Not on file    Gets together: Not on file    Attends religious service: Not on file    Active member of club or organization: Not on file    Attends meetings of clubs or organizations: Not on file    Relationship status: Not on file  . Intimate partner violence:    Fear of current or ex partner: Not on file    Emotionally abused: Not on file    Physically abused: Not on file    Forced sexual activity: Not on file  Other Topics Concern  . Not on file  Social History Narrative   Lives at home w/ his wife   Patient is right handed.   Patient has 1-2 cups of caffeine daily.    Current Outpatient Medications on File Prior to Visit  Medication Sig Dispense Refill  . brimonidine-timolol (COMBIGAN) 0.2-0.5 % ophthalmic solution Place 1 drop into both eyes every 12 (twelve) hours.    . cholecalciferol (VITAMIN D) 1000 units tablet Take 1,000 Units by mouth daily.     Marland Kitchen doxycycline (VIBRA-TABS) 100 MG tablet Take 1 tablet (100 mg total) by mouth 2 (two) times daily. 20 tablet 0  . ELIQUIS 5 MG TABS tablet TAKE 1 TABLET (5 MG TOTAL) BY MOUTH 2 (TWO) TIMES DAILY. 60 tablet 3  . imatinib (GLEEVEC) 100 MG tablet Take 300 mg by mouth daily at 12 noon. Take with meals and large glass of water.Caution:Chemotherapy     . isosorbide mononitrate (IMDUR) 30 MG 24 hr tablet Take 1 tablet (30 mg total) by mouth daily. 90 tablet 3  . lidocaine (LIDODERM)  5 % Place 1 patch onto the skin daily as needed (for back pain). Remove & Discard patch within 12 hours or as directed by MD     . loperamide (IMODIUM) 2 MG capsule Take 2-4 mg by mouth 3 (three) times daily as needed for diarrhea or loose stools.     . Melatonin 3 MG TABS Take 3 mg by mouth at bedtime  as needed (for sleep).     . Multiple Vitamin (MULTIVITAMIN) tablet Take 1 tablet by mouth daily.    . predniSONE (DELTASONE) 1 MG tablet Take 3 tablets (3 mg total) by mouth daily with breakfast. 270 tablet 1  . pyridostigmine (MESTINON) 60 MG tablet Take 60 mg by mouth daily.    . ranitidine (ZANTAC) 150 MG tablet Take 150 mg by mouth daily at 12 noon.     . rosuvastatin (CRESTOR) 10 MG tablet Take 5 mg by mouth daily.    . traMADol (ULTRAM) 50 MG tablet Take 2 tablets (100 mg total) by mouth every 8 (eight) hours. (Patient taking differently: Take 100 mg by mouth 3 (three) times daily. ) 15 tablet 0   No current facility-administered medications on file prior to visit.     Allergies  Allergen Reactions  . Phenergan [Promethazine Hcl] Swelling and Other (See Comments)    Cannot be combined with Zofran because swelling of the eyes resulted and patient became very restless-  . Zofran [Ondansetron Hcl] Swelling and Other (See Comments)    Cannot be combined with Phenergan because swelling of the eyes resulted and patient became very restless-  . Sulfamethoxazole Rash and Itching    Family History  Problem Relation Age of Onset  . Heart disease Mother   . Heart attack Mother   . Stroke Mother   . Heart disease Father   . Heart attack Father   . Stroke Father   . Breast cancer Sister        Twin   . Heart attack Sister   . Dementia Sister   . Heart disease Sister   . Heart attack Sister   . Clotting disorder Sister   . Heart attack Sister   . Colon cancer Neg Hx     BP (!) 178/87 (BP Location: Right Arm, Patient Position: Sitting, Cuff Size: Normal)   Pulse 78   Temp 98.3 F  (36.8 C) (Oral)   Ht 5\' 7"  (1.702 m)   Wt 178 lb (80.7 kg)   SpO2 98%   BMI 27.88 kg/m     Review of Systems Left nostril pain is slightly improved. he has bilat leg swelling    Objective:   Physical Exam VITAL SIGNS:  See vs page GENERAL: no distress Left nostil: slight swelling and erythema right axilla: 4 cm firm/tender mass.  No drainage.  Ext: 2+ bilat leg edema.    Incision and drainage consent obtained, signed form on chart The area is first sprayed with cooling agent local: xylocaine 2%, with epinephrine prep: alcohol pad 1 cm longitudinal incision is made.  1 cc purulent material is expressed Area is covered with antibiotic ointment and a large bandaid.  no complications      Assessment & Plan:  Pustule, new, s/p I and d HTN: he needs increased rx Edema: worse Tiny pustule of the nostril, not better yet.   Patient Instructions  Please continue the same doxycycline. Keep the armpit area covered with antibiotic ointment and a large bandaid. please resume the lasix and lisinopril.  blood tests are requested for you today.  We'll let you know about the results. I'll see you next time.

## 2017-09-27 NOTE — Patient Instructions (Addendum)
Please continue the same doxycycline. Keep the armpit area covered with antibiotic ointment and a large bandaid. please resume the lasix and lisinopril.  blood tests are requested for you today.  We'll let you know about the results. I'll see you next time.

## 2017-09-29 ENCOUNTER — Other Ambulatory Visit: Payer: Self-pay

## 2017-09-29 ENCOUNTER — Emergency Department (HOSPITAL_COMMUNITY): Payer: Medicare Other

## 2017-09-29 ENCOUNTER — Ambulatory Visit: Payer: Medicare Other | Admitting: Physical Medicine & Rehabilitation

## 2017-09-29 ENCOUNTER — Encounter (HOSPITAL_COMMUNITY): Payer: Self-pay | Admitting: Emergency Medicine

## 2017-09-29 ENCOUNTER — Emergency Department (HOSPITAL_COMMUNITY)
Admission: EM | Admit: 2017-09-29 | Discharge: 2017-09-29 | Disposition: A | Discharge status: Home / Self Care | Payer: Medicare Other | Attending: Emergency Medicine | Admitting: Emergency Medicine

## 2017-09-29 DIAGNOSIS — E1122 Type 2 diabetes mellitus with diabetic chronic kidney disease: Secondary | ICD-10-CM | POA: Insufficient documentation

## 2017-09-29 DIAGNOSIS — N189 Chronic kidney disease, unspecified: Secondary | ICD-10-CM | POA: Insufficient documentation

## 2017-09-29 DIAGNOSIS — I2510 Atherosclerotic heart disease of native coronary artery without angina pectoris: Secondary | ICD-10-CM | POA: Insufficient documentation

## 2017-09-29 DIAGNOSIS — Z8673 Personal history of transient ischemic attack (TIA), and cerebral infarction without residual deficits: Secondary | ICD-10-CM | POA: Insufficient documentation

## 2017-09-29 DIAGNOSIS — I130 Hypertensive heart and chronic kidney disease with heart failure and stage 1 through stage 4 chronic kidney disease, or unspecified chronic kidney disease: Secondary | ICD-10-CM | POA: Diagnosis not present

## 2017-09-29 DIAGNOSIS — Z9221 Personal history of antineoplastic chemotherapy: Secondary | ICD-10-CM | POA: Diagnosis not present

## 2017-09-29 DIAGNOSIS — Z86711 Personal history of pulmonary embolism: Secondary | ICD-10-CM | POA: Insufficient documentation

## 2017-09-29 DIAGNOSIS — Z79899 Other long term (current) drug therapy: Secondary | ICD-10-CM | POA: Insufficient documentation

## 2017-09-29 DIAGNOSIS — I5032 Chronic diastolic (congestive) heart failure: Secondary | ICD-10-CM | POA: Insufficient documentation

## 2017-09-29 DIAGNOSIS — M5417 Radiculopathy, lumbosacral region: Secondary | ICD-10-CM | POA: Diagnosis not present

## 2017-09-29 DIAGNOSIS — Z7901 Long term (current) use of anticoagulants: Secondary | ICD-10-CM | POA: Insufficient documentation

## 2017-09-29 DIAGNOSIS — M25552 Pain in left hip: Secondary | ICD-10-CM | POA: Diagnosis present

## 2017-09-29 DIAGNOSIS — M545 Low back pain: Secondary | ICD-10-CM | POA: Diagnosis not present

## 2017-09-29 LAB — BASIC METABOLIC PANEL
ANION GAP: 8 (ref 5–15)
BUN: 17 mg/dL (ref 8–23)
CALCIUM: 8.9 mg/dL (ref 8.9–10.3)
CO2: 29 mmol/L (ref 22–32)
Chloride: 103 mmol/L (ref 98–111)
Creatinine, Ser: 1.34 mg/dL — ABNORMAL HIGH (ref 0.61–1.24)
GFR calc Af Amer: 56 mL/min — ABNORMAL LOW (ref >60)
GFR, EST NON AFRICAN AMERICAN: 49 mL/min — AB (ref >60)
GLUCOSE: 130 mg/dL — AB (ref 70–99)
Potassium: 4.1 mmol/L (ref 3.5–5.1)
Sodium: 140 mmol/L (ref 135–145)

## 2017-09-29 MED ORDER — ORPHENADRINE CITRATE ER 100 MG PO TB12
100.0000 mg | ORAL_TABLET | Freq: Two times a day (BID) | ORAL | Status: DC
  Administered 2017-09-29: 100 mg via ORAL
  Filled 2017-09-29: qty 1

## 2017-09-29 MED ORDER — SODIUM CHLORIDE 0.9 % IV SOLN: Freq: Once | INTRAVENOUS | Status: DC

## 2017-09-29 MED ORDER — OXYCODONE HCL 5 MG PO TABS: 5.0000 mg | ORAL_TABLET | Freq: Four times a day (QID) | ORAL | 0 refills | Status: DC | PRN

## 2017-09-29 MED ORDER — ESOMEPRAZOLE MAGNESIUM 40 MG PO CPDR: 40.0000 mg | DELAYED_RELEASE_CAPSULE | Freq: Every day | ORAL | 0 refills | Status: DC

## 2017-09-29 MED ORDER — LISINOPRIL 10 MG PO TABS
10.0000 mg | ORAL_TABLET | Freq: Once | ORAL | Status: AC
  Administered 2017-09-29: 10 mg via ORAL
  Filled 2017-09-29: qty 1

## 2017-09-29 MED ORDER — ISOSORBIDE MONONITRATE ER 30 MG PO TB24
30.0000 mg | ORAL_TABLET | Freq: Every day | ORAL | Status: DC
  Administered 2017-09-29: 30 mg via ORAL
  Filled 2017-09-29: qty 1

## 2017-09-29 MED ORDER — LORAZEPAM 2 MG/ML IJ SOLN
0.5000 mg | INTRAMUSCULAR | Status: DC | PRN
  Administered 2017-09-29: 0.5 mg via INTRAVENOUS
  Filled 2017-09-29: qty 1

## 2017-09-29 MED ORDER — DEXAMETHASONE SODIUM PHOSPHATE 10 MG/ML IJ SOLN
10.0000 mg | Freq: Once | INTRAMUSCULAR | Status: AC
  Administered 2017-09-29: 10 mg via INTRAMUSCULAR
  Filled 2017-09-29: qty 1

## 2017-09-29 MED ORDER — HYDROMORPHONE HCL 1 MG/ML IJ SOLN
0.5000 mg | Freq: Once | INTRAMUSCULAR | Status: AC
  Administered 2017-09-29: 0.5 mg via INTRAVENOUS
  Filled 2017-09-29: qty 1

## 2017-09-29 MED ORDER — ORPHENADRINE CITRATE ER 100 MG PO TB12: 100.0000 mg | ORAL_TABLET | Freq: Two times a day (BID) | ORAL | 0 refills | Status: DC

## 2017-09-29 NOTE — ED Triage Notes (Signed)
Patient reports persistent left hip pain radiating to left leg for several weeks worse this morning unrelieved by prescription  Tramadol , denies injury or fall , pain increases with movement /changing positions .

## 2017-09-29 NOTE — ED Provider Notes (Signed)
[facility] EMERGENCY DEPARTMENT Provider Note   CSN: [id number] Arrival date & time: 09/29/17  0548     History   Chief Complaint Chief Complaint  Patient presents with  . Hip Pain    HPI [name] is a 77 y.o. male.  HPI Patient reports severe left hip pain.  This is been a problem since the beginning of August.  He has been seen by orthopedics and had x-rays and an MRI of the hip.  He reports that he was diagnosed with tendon or muscle tear.  He reports he got injections and pain medications but nothing improved his pain.  He reports now his orthopedic surgeon suspects the problem is in his back and has referred him back to his neurosurgeon Dr. Ronnald Ramp.  Patient reports he does not have his follow-up appointment however with him another week and a half.  Reports is gotten so painful that he has to use a walker.  He also reports he is getting periodic numbness in the foot.  Because he tolerated the Percocet poorly, stating that it tore up his stomach, he is taking tramadol which she reports does not help. Past Medical History:  Diagnosis Date  . Bruises easily    d/t being on Eliquis  . Carotid artery disease ([place])    a. Duplex 05/2014: 13-08% RICA, 6-57% LICA, elevated velocities in right subclavian artery, normal left subclavian artery..  . Chronic back pain    HNP  . Chronic kidney disease (CKD)   . Claustrophobia    takes Ativan as needed  . 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 Coronary atherosclerosis of unspecified type of vessel, native or graft    a. cath in 2003 showed 10-20% LM, scattered 20% prox-mid LAD, 30% more distal LAD, 50-60% stenosis of LAD towards apex, 20% Cx, 30% dRCA, focal 95% stenosis in smaller of 2 branches of PDA, EF 60-65%.  . Diabetes mellitus ([place])   . Diarrhea    takes Imodium daily as needed  . Diverticulosis   . Diverticulosis of colon (without mention of hemorrhage)   . DJD (degenerative joint  disease)   . DVT (deep venous thrombosis) ([place]) 07/2011  . Esophageal stricture   . Essential hypertension    takes Imdur and Lisinopril daily  . Gastric polyps    benign  . GERD (gastroesophageal reflux disease)    takes Nexium daily  . Glaucoma    uses eye drops  . History of bronchitis    not sure when the last time  . History of colon polyps    benign  . History of kidney stones   . History of kidney stones   . Insomnia    takes Melatonin nightly as needed  . Mixed hyperlipidemia    takes Crestor daily  . Myasthenia gravis ([place])    uses prednisone  . Osteoporosis 2016  . Peripheral edema    takes Lasix daily  . Pituitary tumor    checks Dr[facility]Walden at [facility] checks it every Jan   . Pneumonia 01/2016  . PONV (postoperative nausea and vomiting)   . Ptosis   . Pulmonary embolus ([place]) 2012  . Stroke [facility]) 06/2015    Patient Active Problem List   Diagnosis Date Noted  . Nasal abscess 09/25/2017  . Immunocompromised ([place]) 09/25/2017  . Cellulitis of right axilla 09/25/2017  . Anemia 08/05/2017  . Transient ischemic attack (TIA) 07/23/2017  . Subcortical infarction ([place])   .  Right hemiparesis ([place])   . Ataxia 07/21/2017  . TIA (transient ischemic attack)   . Thrombocytopenia ([place])   . Wheezing 06/17/2017  . Bursitis of left elbow 05/01/2017  . Dystonia 12/29/2016  . PAD (peripheral artery disease) ([place]) 11/18/2016  . Osteoporosis 08/28/2016  . Paronychia of great toe, left 08/10/2016  . Carotid arterial disease ([place]) 07/09/2016  . Hypocalcemia 07/09/2016  . Diplopia 07/02/2016  . S/P lumbar laminectomy 03/11/2016  . Radicular pain of left lower extremity 02/21/2016  . Cough 01/10/2016  . 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 Myasthenia gravis ([place])   . Diabetes mellitus type 2 in nonobese (HCC)   . Coronary artery disease involving native coronary artery of native heart without angina pectoris   . Ataxia, post-stroke   . Gait  disturbance, post-stroke   . Proximal leg weakness   . Cervical spondylosis without myelopathy   . Achilles tendinitis of right lower extremity   . Chronic diastolic CHF (congestive heart failure) ([place])   . Gastroesophageal reflux disease without esophagitis   . Chronic kidney disease   . 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 Cerebellar stroke ([place])   . Ischemic stroke ([place])   . Cerebrovascular accident (CVA) due to thrombosis of left vertebral artery ([place])   . History of pulmonary embolus (PE)   . Chronic anticoagulation   . HLD (hyperlipidemia)   . Neck pain 03/19/2015  . Pain in the chest   . 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 Chronic diastolic heart failure, NYHA class 1 ([place]) 03/07/2015  . Diabetic gastroparesis ([place]) 03/07/2015  . Screening for osteoporosis 07/19/2014  . Edema 04/09/2014  . Routine general medical examination at a health care facility 10/23/2013  . Bilateral carotid bruits 05/24/2013  . Abnormal CT scan, lung 05/24/2013  . Encounter for therapeutic drug monitoring 03/14/2013  . Lymphadenopathy, inguinal 01/11/2013  . AKI (acute kidney injury) ([place]) 10/28/2012  . 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 Myasthenia gravis with exacerbation ([place]) 03/31/2012  . Disease of salivary gland 12/09/2011  . Left facial pain 10/28/2011  . Pulmonary embolism ([place]) 10/25/2011  . 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 GERD with stricture 09/23/2011  . Long term (current) use of anticoagulants 08/03/2011  . History of pulmonary embolism 07/30/2011  . History of DVT (deep vein thrombosis) 07/30/2011  . Subconjunctival hemorrhage 06/22/2011  . Bilateral conjunctivitis 04/09/2011  . Diabetes mellitus type 2, controlled ([place]) 12/18/2010  . Hearing loss 08/04/2010  . Ptosis of eyelid 12/30/2009  . Essential hypertension 04/24/2009  . HYPERLIPIDEMIA, MIXED 10/05/2008  . HEMORRHOIDS-INTERNAL  07/02/2008  . HEMORRHOIDS-EXTERNAL 07/02/2008  . DIVERTICULOSIS-COLON 07/02/2008  . PERSONAL HX COLONIC POLYPS 07/02/2008  . RASH-NONVESICULAR 02/07/2008  . PNEUMONIA 12/08/2007  . Chronic myeloid leukemia in remission ([place]) 07/07/2007  . CAD (coronary artery disease) 07/07/2007  . NEPHROLITHIASIS, HX OF 07/07/2007    Past Surgical History:  Procedure Laterality Date  . ABDOMINAL AORTOGRAM W/LOWER EXTREMITY N/A 11/18/2016   Procedure: ABDOMINAL AORTOGRAM W/LOWER EXTREMITY;  Surgeon: [name], MD;  Location: [facility];  Service: Cardiovascular;  Laterality: N/A;  . BACK SURGERY     x2, lumbar and cervical  . CARDIAC CATHETERIZATION    . CATARACT EXTRACTION Bilateral 12/12  . COLONOSCOPY    . ESOPHAGOGASTRODUODENOSCOPY ENDOSCOPY  04/2015  . IR GENERIC HISTORICAL  03/03/2016   IR IVC FILTER PLMT / S&I [name] GUID/MOD SED 03/03/2016  [name], MD [facility]  . IR GENERIC HISTORICAL  04/09/2016   IR RADIOLOGIST EVAL & MGMT 04/09/2016 [name], MD [facility] INTER[facility] RAD  . IR GENERIC HISTORICAL  04/30/2016   IR IVC FILTER RETRIEVAL / S&I [name] GUID/MOD SED 04/30/2016 [name], MD [facility]-INTER[facility] RAD  . LITHOTRIPSY    . LUMBAR LAMINECTOMY/DECOMPRESSION MICRODISCECTOMY Right 09/14/2012   Procedure: Right Lumbar three-four, four-five, Lumbar five-Sacral one decompressive laminectomy;  Surgeon: [name], MD;  Location: [facility];  Service: Neurosurgery;  Laterality: Right;  . LUMBAR LAMINECTOMY/DECOMPRESSION MICRODISCECTOMY Left 03/11/2016   Procedure: Left Lumbar Two-ThreeMicrodiscectomy;  Surgeon: [name], MD;  Location: [facility];  Service: Neurosurgery;  Laterality: Left;  . PERIPHERAL VASCULAR INTERVENTION  11/18/2016   Procedure: PERIPHERAL VASCULAR INTERVENTION;  Surgeon: [name], MD;  Location: [facility];  Service: Cardiovascular;;  Left popliteal  . SHOULDER SURGERY Left 05/07/2009  . TONSILLECTOMY          Home Medications    Prior to  Admission medications   Medication Sig Start Date End Date Taking? Authorizing Provider  brimonidine-timolol (COMBIGAN) 0.2-0.5 % ophthalmic solution Place 1 drop into both eyes every 12 (twelve) hours.   Yes [provider]  cholecalciferol (VITAMIN D) 1000 units tablet Take 1,000 Units by mouth daily.    Yes [provider]  doxycycline (VIBRA-TABS) 100 MG tablet Take 1 tablet (100 mg total) by mouth 2 (two) times daily. 09/25/17  Yes Bedsole, Amy E, MD  ELIQUIS 5 MG TABS tablet TAKE 1 TABLET (5 MG TOTAL) BY MOUTH 2 (TWO) TIMES DAILY. 12/09/15  Yes Kathrynn Ducking, MD  furosemide (LASIX) 20 MG tablet Take 1 tablet (20 mg total) by mouth daily. 09/27/17  Yes Renato Shin, MD  imatinib (GLEEVEC) 100 MG tablet Take 300 mg by mouth daily at 12 noon. Take with meals and large glass of water.Caution:Chemotherapy    Yes [provider]  isosorbide mononitrate (IMDUR) 30 MG 24 hr tablet Take 1 tablet (30 mg total) by mouth daily. 09/17/15  Yes Sherren Mocha, MD  lidocaine (LIDODERM) 5 % Place 1 patch onto the skin daily as needed (for back pain). Remove & Discard patch within 12 hours or as directed by MD    Yes [provider]  lisinopril (PRINIVIL,ZESTRIL) 10 MG tablet Take 1 tablet (10 mg total) by mouth daily. 09/27/17  Yes Renato Shin, MD  loperamide (IMODIUM) 2 MG capsule Take 2-4 mg by mouth 3 (three) times daily as needed for diarrhea or loose stools.    Yes [provider]  Melatonin 3 MG TABS Take 3 mg by mouth at bedtime as needed (for sleep).    Yes [provider]  Multiple Vitamin (MULTIVITAMIN) tablet Take 1 tablet by mouth daily.   Yes [provider]  predniSONE (DELTASONE) 1 MG tablet Take 3 tablets (3 mg total) by mouth daily with breakfast. 06/23/17  Yes Kathrynn Ducking, MD  pyridostigmine (MESTINON) 60 MG tablet Take 60 mg by mouth daily.   Yes [provider]  ranitidine (ZANTAC) 150 MG tablet Take 150 mg by  mouth daily at 12 noon.    Yes [provider]  rosuvastatin (CRESTOR) 10 MG tablet Take 5 mg by mouth daily.   Yes [provider]  traMADol (ULTRAM) 50 MG tablet Take 2 tablets (100 mg total) by mouth every 8 (eight) hours. Patient taking differently: Take 100 mg by mouth 3 (three) times daily.  06/28/17  Yes Montine Circle, PA-C  esomeprazole (NEXIUM) 40 MG capsule Take 1 capsule (40 mg total) by mouth daily. 09/29/17   Charlesetta Shanks, MD  orphenadrine (NORFLEX) 100 MG tablet Take 1 tablet (100 mg total) by mouth 2 (two) times daily. 09/29/17   Charlesetta Shanks, MD  oxyCODONE (OXY IR/ROXICODONE) 5 MG immediate release tablet Take 1 tablet (5 mg total) by mouth every 6 (six) hours as needed for moderate pain or severe pain. 09/29/17   Charlesetta Shanks, MD    Family History Family History  Problem Relation Age of Onset  . Heart disease Mother   . Heart attack Mother   . Stroke Mother   . Heart disease Father   . Heart attack Father   . Stroke Father   . Breast cancer Sister        Twin   . Heart attack Sister   . Dementia Sister   . Heart disease Sister   . Heart attack Sister   . Clotting disorder Sister   . Heart attack Sister   . Colon cancer Neg Hx     Social History Social History   Tobacco Use  . Smoking status: Never Smoker  . Smokeless tobacco: Never Used  Substance Use Topics  . Alcohol use: No    Alcohol/week: 0.0 standard drinks  . Drug use: No     Allergies   Phenergan [promethazine hcl]; Zofran [ondansetron hcl]; and Sulfamethoxazole   Review of Systems Review of Systems 10 Systems reviewed and are negative for acute change except as noted in the HPI.  Physical Exam Updated Vital Signs BP (!) 144/80   Pulse 71   Temp 98.1 F (36.7 C) (Oral)   Resp 18   Ht 5\' 7"  (1.702 m)   Wt 80.7 kg   SpO2 97%   BMI 27.88 kg/m   Physical Exam  Constitutional: He is oriented to person, place, and time.  She is alert and nontoxic with clear  mental status.  No respiratory distress.  HENT:  Head: Normocephalic and atraumatic.  Eyes: EOM are normal.  Cardiovascular: Normal rate, regular rhythm, normal heart sounds and intact distal pulses.  Pulmonary/Chest: Effort normal and breath sounds normal.  Abdominal: Soft. He exhibits no distension. There is no tenderness. There is no guarding.  Musculoskeletal:  Patient endorses focal pain to palpation superior and medial to the greater trochanter.  No palpable soft tissue anomaly.  Pain is also severely reproduced by rolling or forward flexion.  Also reproduced by straight leg raise to 35 degrees.  Patient has symmetric pedal pulses.feet are  Warm and dry.  Patient has intact dorsa flexion and planter flexion.  He is able to elevate the leg and hold it although he reports that significantly reproduces the pain in his hip buttock  Neurological: He is alert and oriented to person, place, and time. He exhibits normal muscle tone. Coordination normal.  Skin: Skin is warm and dry.  Psychiatric: He has a normal mood and affect.     ED Treatments / Results  Labs (all labs ordered are listed, but only abnormal results are displayed) Labs Reviewed  BASIC METABOLIC PANEL - Abnormal; Notable for the following components:      Result Value   Glucose, Bld 130 (*)    Creatinine, Ser 1.34 (*)    GFR calc non Af Amer 49 (*)    GFR calc Af Amer 56 (*)    All other components within normal limits    EKG None  Radiology Mr Lumbar Spine Wo Contrast  Result Date: 09/29/2017 CLINICAL DATA:  Low back pain radiating to the left leg. EXAM: MRI LUMBAR SPINE WITHOUT CONTRAST TECHNIQUE: Multiplanar, multisequence MR imaging of the lumbar spine was performed. No intravenous contrast was administered. COMPARISON:  Radiography 09/24/2017.  MRI 02/22/2016. FINDINGS: Segmentation:  5 lumbar type vertebral bodies. Alignment:  5 mm anterolisthesis L4-5. Vertebrae:  No fracture or primary bone lesion. Conus  medullaris and cauda equina: Conus extends to the L1 level. Conus and cauda equina appear normal. Paraspinal and other soft tissues: Negative Disc levels: T11-12, T12-L1 and L1-2: No significant finding. L2-3: Previous left hemilaminectomy. Broad-based disc herniation with caudal migration of a disc fragment behind L3 centrally and slightly towards the left. Small migrated fragment upward to the right of midline. Stenosis of both lateral recesses and neural foramina. Caudally migrated fragment could focally affect the left L3 nerve. L3-4: Previous right hemilaminectomy. Right posterolateral disc herniation with caudal down turning centrally and to the right of midline. Foraminal narrowing on the right could affect the exiting right L3 nerve. Right-sided nerve root compression could occur in the right lateral recess. L4-5: Advanced bilateral facet arthropathy with 5 mm of anterolisthesis. Broad-based disc herniation more prominent towards the left with upward migration. Severe multifactorial stenosis of the lateral recesses at this level. The disc material could in particular cause neural compression on the left in the lateral recess and intervertebral foramen. L5-S1: Prominent anterior disc herniation. Moderate posterior disc herniation more prominent towards the left with caudal down turning of the fragment. Foraminal narrowing on the right could affect the exiting L5 nerve. Likely compressive narrowing of the left lateral recess and intervertebral foramen. Caudally migrated fragment likely affects the left S1 nerve. Compared to the study of 2018, all findings were present to a degree at that time but have worsened. 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 Findings were all present at the time of the previous study but have worsened. Electronically Signed   By: Nelson Chimes M.D.   On: 09/29/2017  09:33    Procedures Procedures (including critical care time)  Medications Ordered in ED Medications  orphenadrine (NORFLEX) 12 hr tablet 100 mg (100 mg Oral Given 09/29/17 1103)  isosorbide mononitrate (IMDUR) 24 hr tablet 30 mg (30 mg Oral Given 09/29/17 1103)  0.9 %  sodium chloride infusion ( Intravenous Hold 09/29/17 0749)  LORazepam (ATIVAN) injection 0.5 mg (0.5 mg Intravenous Given 09/29/17 0803)  HYDROmorphone (DILAUDID) injection 0.5 mg (0.5 mg Intravenous Given 09/29/17 0737)  lisinopril (PRINIVIL,ZESTRIL) tablet 10 mg (10 mg Oral Given 09/29/17 0749)  dexamethasone (DECADRON) injection 10 mg (10 mg Intramuscular Given 09/29/17 1140)     Initial Impression / Assessment and Plan / ED Course  I have reviewed the triage vital signs and the nursing notes.  Pertinent labs & imaging results that were available during my care of the patient were reviewed by me and considered in my medical decision making (see chart for details).  Clinical Course as of Sep 29 1325  Wed Sep 29, 2017  1047 Patient is now resting comfortably.  He advises medications have helped.  Patient still has pain with straight leg raise.   [MP]    Clinical Course User Index [MP] Charlesetta Shanks, MD    Consult: Reviewed with Dr. Ronnald Ramp of neurosurgery.  Suggests a Decadron shot in the emergency department and continuing the patient  on pain control as an outpatient.  He advises surgical interventions will be more complex, fascially in light of the patient's comorbid conditions and the patient should follow-up for full consultation in the office and review of management options.  Final Clinical Impressions(s) / ED Diagnoses   Final diagnoses:  Lumbosacral radiculopathy   Patient has had over 3 weeks of severe buttock and hip pain.  Has not responded to injections and management of hip pain by orthopedics.  I did proceed with MRI of the LS spine and there are multiple disc protrusions and degenerative areas.  Dr.  Ronnald Ramp felt that MRI findings were supportive of the patient's pain and numbness.  Patient was well pain controlled in the emergency department with 1/2 mg of Dilaudid, Norflex and Ativan which was required for MRI.  Was able to rest and felt much improved.  We have created a plan for pain management that includes resuming his Nexium as he reports that the pain medications were hard on his stomach.  He will take oxycodone IR with food and Norflex twice daily.  Given 1 Decadron shot in the emergency department.  Family members are with the patient and aware of the follow-up plan home pain control plan ED Discharge Orders         Ordered    oxyCODONE (OXY IR/ROXICODONE) 5 MG immediate release tablet  Every 6 hours PRN     09/29/17 1314    orphenadrine (NORFLEX) 100 MG tablet  2 times daily     09/29/17 1314    esomeprazole (NEXIUM) 40 MG capsule  Daily     09/29/17 1314           Charlesetta Shanks, MD 09/29/17 1331

## 2017-09-29 NOTE — Discharge Instructions (Signed)
1.  Try to follow-up with your family doctor this week to see how you are doing with your pain control.  Contact Dr. Adah Salvage office to see if your appointment can be moved up. 2.  Resume taking Nexium that he had been previously prescribed.  Continue your prescribed Zantac.  Always take your pain medication with a small amount of food.  You have any tendency towards constipation, take an over-the-counter stool softener because narcotic pain medications can cause constipation.  Take your muscle relaxer as prescribed twice daily.

## 2017-09-29 NOTE — ED Notes (Signed)
Pt transported to MRI in NAD.

## 2017-10-01 ENCOUNTER — Ambulatory Visit: Payer: Medicare Other | Admitting: Physical Medicine & Rehabilitation

## 2017-10-04 ENCOUNTER — Other Ambulatory Visit: Payer: Self-pay | Admitting: Neurology

## 2017-10-05 DIAGNOSIS — M5416 Radiculopathy, lumbar region: Secondary | ICD-10-CM | POA: Diagnosis not present

## 2017-10-06 ENCOUNTER — Encounter: Payer: Self-pay | Admitting: Endocrinology

## 2017-10-06 ENCOUNTER — Ambulatory Visit (INDEPENDENT_AMBULATORY_CARE_PROVIDER_SITE_OTHER): Payer: No Typology Code available for payment source | Admitting: Endocrinology

## 2017-10-06 VITALS — BP 116/82 | HR 81 | Ht 67.0 in | Wt 173.0 lb

## 2017-10-06 DIAGNOSIS — Z0000 Encounter for general adult medical examination without abnormal findings: Secondary | ICD-10-CM | POA: Diagnosis not present

## 2017-10-06 DIAGNOSIS — I10 Essential (primary) hypertension: Secondary | ICD-10-CM | POA: Diagnosis not present

## 2017-10-06 DIAGNOSIS — R609 Edema, unspecified: Secondary | ICD-10-CM

## 2017-10-06 DIAGNOSIS — E1159 Type 2 diabetes mellitus with other circulatory complications: Secondary | ICD-10-CM

## 2017-10-06 DIAGNOSIS — E1143 Type 2 diabetes mellitus with diabetic autonomic (poly)neuropathy: Secondary | ICD-10-CM

## 2017-10-06 DIAGNOSIS — R0600 Dyspnea, unspecified: Secondary | ICD-10-CM

## 2017-10-06 LAB — BASIC METABOLIC PANEL
BUN: 20 mg/dL (ref 6–23)
CALCIUM: 8.6 mg/dL (ref 8.4–10.5)
CO2: 28 mEq/L (ref 19–32)
Chloride: 101 mEq/L (ref 96–112)
Creatinine, Ser: 1.41 mg/dL (ref 0.40–1.50)
GFR: 51.48 mL/min — ABNORMAL LOW (ref >60.00)
Glucose, Bld: 124 mg/dL — ABNORMAL HIGH (ref 70–99)
POTASSIUM: 4.2 meq/L (ref 3.5–5.1)
SODIUM: 136 meq/L (ref 135–145)

## 2017-10-06 LAB — HEMOGLOBIN A1C: HEMOGLOBIN A1C: 6.8 % — AB (ref 4.6–6.5)

## 2017-10-06 LAB — BRAIN NATRIURETIC PEPTIDE: PRO B NATRI PEPTIDE: 73 pg/mL (ref 0.0–100.0)

## 2017-10-06 NOTE — Patient Instructions (Signed)
blood tests are requested for you today.  We'll let you know about the results. Please consider these measures for your health:  minimize alcohol.  Do not use tobacco products.  Have a colonoscopy at least every 10 years from age 77.  Keep firearms safely stored.  Always use seat belts.  have working smoke alarms in your home.  See an eye doctor and dentist regularly.  Never drive under the influence of alcohol or drugs (including prescription drugs).  Those with fair skin should take precautions against the sun, and should carefully examine their skin once per month, for any new or changed moles. please let me know what your wishes would be, if artificial life support measures should become necessary.  It is critically important to prevent falling down (keep floor areas well-lit, dry, and free of loose objects.  If you have a cane, walker, or wheelchair, you should use it, even for short trips around the house.  Wear flat-soled shoes.  Also, try not to rush)

## 2017-10-06 NOTE — Progress Notes (Signed)
Subjective:    Patient ID: [name], male    DOB: 1940-02-18, 77 y.o.   MRN: [id number]  HPI  The state of at least three ongoing medical problems is addressed today, with interval history of each noted here: Chronic leg edema: pt says this is unchanged.  This is a stable problem. HTN: he has slight doe.  This is a stable problem. Type 2 DM, with CAD: he denies chest pain. This is a stable problem. Past Medical History:  Diagnosis Date  . Bruises easily    d/t being on Eliquis  . Carotid artery disease ([place])    a. Duplex 05/2014: 15-17% RICA, 6-16% LICA, elevated velocities in right subclavian artery, normal left subclavian artery..  . Chronic back pain    HNP  . Chronic kidney disease (CKD)   . Claustrophobia    takes Ativan as needed  . 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 Coronary atherosclerosis of unspecified type of vessel, native or graft    a. cath in 2003 showed 10-20% LM, scattered 20% prox-mid LAD, 30% more distal LAD, 50-60% stenosis of LAD towards apex, 20% Cx, 30% dRCA, focal 95% stenosis in smaller of 2 branches of PDA, EF 60-65%.  . Diabetes mellitus ([place])   . Diarrhea    takes Imodium daily as needed  . Diverticulosis   . Diverticulosis of colon (without mention of hemorrhage)   . DJD (degenerative joint disease)   . DVT (deep venous thrombosis) ([place]) 07/2011  . Esophageal stricture   . Essential hypertension    takes Imdur and Lisinopril daily  . Gastric polyps    benign  . GERD (gastroesophageal reflux disease)    takes Nexium daily  . Glaucoma    uses eye drops  . History of bronchitis    not sure when the last time  . History of colon polyps    benign  . History of kidney stones   . History of kidney stones   . Insomnia    takes Melatonin nightly as needed  . Mixed hyperlipidemia    takes Crestor daily  . Myasthenia gravis ([place])    uses prednisone  . Osteoporosis 2016  . Peripheral edema    takes Lasix daily    . Pituitary tumor    checks Dr[facility]Walden at [facility] checks it every Jan   . Pneumonia 01/2016  . PONV (postoperative nausea and vomiting)   . Ptosis   . Pulmonary embolus ([place]) 2012  . Stroke [facility]) 06/2015    Past Surgical History:  Procedure Laterality Date  . ABDOMINAL AORTOGRAM W/LOWER EXTREMITY N/A 11/18/2016   Procedure: ABDOMINAL AORTOGRAM W/LOWER EXTREMITY;  Surgeon: [name], MD;  Location: [place] [facility];  Service: Cardiovascular;  Laterality: N/A;  . BACK SURGERY     x2, lumbar and cervical  . CARDIAC CATHETERIZATION    . CATARACT EXTRACTION Bilateral 12/12  . COLONOSCOPY    . ESOPHAGOGASTRODUODENOSCOPY ENDOSCOPY  04/2015  . IR GENERIC HISTORICAL  03/03/2016   IR IVC FILTER PLMT / S&I [name] GUID/MOD SED 03/03/2016 [name], MD [facility]  . IR GENERIC HISTORICAL  04/09/2016   IR RADIOLOGIST EVAL & MGMT 04/09/2016 [name], MD [facility]  . IR GENERIC HISTORICAL  04/30/2016   IR IVC FILTER RETRIEVAL / S&I [name] GUID/MOD SED 04/30/2016 [name], MD [facility]  . LITHOTRIPSY    . LUMBAR LAMINECTOMY/DECOMPRESSION MICRODISCECTOMY Right 09/14/2012   Procedure: Right  Lumbar three-four, four-five, Lumbar five-Sacral one decompressive laminectomy;  Surgeon: [name], MD;  Location: [place] [facility];  Service: Neurosurgery;  Laterality: Right;  . LUMBAR LAMINECTOMY/DECOMPRESSION MICRODISCECTOMY Left 03/11/2016   Procedure: Left Lumbar Two-ThreeMicrodiscectomy;  Surgeon: [name], MD;  Location: [place];  Service: Neurosurgery;  Laterality: Left;  . PERIPHERAL VASCULAR INTERVENTION  11/18/2016   Procedure: PERIPHERAL VASCULAR INTERVENTION;  Surgeon: [name], MD;  Location: [place] [facility];  Service: Cardiovascular;;  Left popliteal  . SHOULDER SURGERY Left 05/07/2009  . TONSILLECTOMY      Social History   Socioeconomic History  . Marital status: Married    Spouse name: Hassan Rowan  . Number of children: 2  . Years of education: 1-College   . Highest education level: Not on file  Occupational History  . 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 Financial resource strain: Not on file  . Food insecurity:    Worry: Not on file    Inability: Not on file  . Transportation needs:    Medical: Not on file    Non-medical: Not on file  Tobacco Use  . Smoking status: Never Smoker  . Smokeless tobacco: Never Used  Substance and Sexual Activity  . Alcohol use: No    Alcohol/week: 0.0 standard drinks  . Drug use: No  . Sexual activity: Not Currently  Lifestyle  . Physical activity:    Days per week: Not on file    Minutes per session: Not on file  . Stress: Not on file  Relationships  . Social connections:    Talks on phone: Not on file    Gets together: Not on file    Attends religious service: Not on file    Active member of club or organization: Not on file    Attends meetings of clubs or organizations: Not on file    Relationship status: Not on file  . Intimate partner violence:    Fear of current or ex partner: Not on file    Emotionally abused: Not on file    Physically abused: Not on file    Forced sexual activity: Not on file  Other Topics Concern  . Not on file  Social History Narrative   Lives at home w/ his wife   Patient is right handed.   Patient has 1-2 cups of caffeine daily.    Current Outpatient Medications on File Prior to Visit  Medication Sig Dispense Refill  . brimonidine-timolol (COMBIGAN) 0.2-0.5 % ophthalmic solution Place 1 drop into both eyes every 12 (twelve) hours.    . cholecalciferol (VITAMIN D) 1000 units tablet Take 1,000 Units by mouth daily.     Marland Kitchen doxycycline (VIBRA-TABS) 100 MG tablet Take 1 tablet (100 mg total) by mouth 2 (two) times daily. 20 tablet 0  . ELIQUIS 5 MG TABS tablet TAKE 1 TABLET (5 MG TOTAL) BY MOUTH 2 (TWO) TIMES DAILY. 60 tablet 3  . esomeprazole (NEXIUM) 40 MG capsule Take 1 capsule (40 mg total) by mouth daily. 30 capsule  0  . furosemide (LASIX) 20 MG tablet Take 1 tablet (20 mg total) by mouth daily. 90 tablet 3  . imatinib (GLEEVEC) 100 MG tablet Take 300 mg by mouth daily at 12 noon. Take with meals and large glass of water.Caution:Chemotherapy     . isosorbide mononitrate (IMDUR) 30 MG 24 hr tablet Take 1 tablet (30 mg total) by mouth daily. 90 tablet  3  . lidocaine (LIDODERM) 5 % Place 1 patch onto the skin daily as needed (for back pain). Remove & Discard patch within 12 hours or as directed by MD     . lisinopril (PRINIVIL,ZESTRIL) 10 MG tablet Take 1 tablet (10 mg total) by mouth daily. 30 tablet 11  . loperamide (IMODIUM) 2 MG capsule Take 2-4 mg by mouth 3 (three) times daily as needed for diarrhea or loose stools.     . Melatonin 3 MG TABS Take 3 mg by mouth at bedtime as needed (for sleep).     . Multiple Vitamin (MULTIVITAMIN) tablet Take 1 tablet by mouth daily.    . orphenadrine (NORFLEX) 100 MG tablet Take 1 tablet (100 mg total) by mouth 2 (two) times daily. 30 tablet 0  . oxyCODONE (OXY IR/ROXICODONE) 5 MG immediate release tablet Take 1 tablet (5 mg total) by mouth every 6 (six) hours as needed for moderate pain or severe pain. 30 tablet 0  . predniSONE (DELTASONE) 1 MG tablet Take 3 tablets (3 mg total) by mouth daily with breakfast. 270 tablet 1  . pyridostigmine (MESTINON) 60 MG tablet Take 60 mg by mouth daily.    . ranitidine (ZANTAC) 150 MG tablet Take 150 mg by mouth daily at 12 noon.     . rosuvastatin (CRESTOR) 10 MG tablet Take 5 mg by mouth daily.    . traMADol (ULTRAM) 50 MG tablet Take 2 tablets (100 mg total) by mouth every 8 (eight) hours. (Patient taking differently: Take 100 mg by mouth 3 (three) times daily. ) 15 tablet 0   No current facility-administered medications on file prior to visit.     Allergies  Allergen Reactions  . Phenergan [Promethazine Hcl] Swelling and Other (See Comments)    Cannot be combined with Zofran because swelling of the eyes resulted and patient  became very restless-  . Zofran [Ondansetron Hcl] Swelling and Other (See Comments)    Cannot be combined with Phenergan because swelling of the eyes resulted and patient became very restless-  . Sulfamethoxazole Rash and Itching    Family History  Problem Relation Age of Onset  . Heart disease Mother   . Heart attack Mother   . Stroke Mother   . Heart disease Father   . Heart attack Father   . Stroke Father   . Breast cancer Sister        Twin   . Heart attack Sister   . Dementia Sister   . Heart disease Sister   . Heart attack Sister   . Clotting disorder Sister   . Heart attack Sister   . Colon cancer Neg Hx     BP 116/82   Pulse 81   Ht 5\' 7"  (1.702 m)   Wt 173 lb (78.5 kg)   SpO2 97%   BMI 27.10 kg/m    Review of Systems Denies cough.  He has mild nocturia.      Objective:   Physical Exam VITAL SIGNS:  See vs page GENERAL: no distress LUNGS:  Clear to auscultation HEART:  Regular rate and rhythm without murmurs noted. Normal S1,S2.   Ext: 2+ bilat leg edema   Lab Results  Component Value Date   HGBA1C 6.8 (H) 10/06/2017      Assessment & Plan:  Chronic leg edema: check BNP HTN: well-controlled.  Please continue the same medication Type 2 DM, with CAD: well-controlled.  Please continue the same medication  Subjective:   Patient here for  Medicare annual wellness visit and management of other chronic and acute problems.     Risk factors: advanced age    45 of Physicians Providing Medical Care to Patient:  See "snapshot"   Activities of Daily Living: In your present state of health, do you have any difficulty performing the following activities ([place] wife)?:  Preparing food and eating?: No  Bathing yourself: yes, due to back pain Getting dressed: No  Using the toilet:No  Moving around from place to place: No  In the past year have you fallen or had a near fall?:No    Home Safety: Has smoke detector and wears seat belts. Firearms are  safely stored. No excess sun exposure.   Opioid Use: yes, from NS   Diet and Exercise  Current exercise habits: limited by back pain Dietary issues discussed: pt reports a healthy diet   Depression Screen  Q1: Over the past two weeks, have you felt down, depressed or hopeless? no  Q2: Over the past two weeks, have you felt little interest or pleasure in doing things? no   The following portions of the patient's history were reviewed and updated as appropriate: allergies, current medications, past family history, past medical history, past social history, past surgical history and problem list.   Review of Systems  No recent change in chronic hearing and visual losses Objective:   Vision:  See [facility] today Hearing: grossly normal Body mass index:  See vs page Msk: pt easily and quickly performs "get-up-and-go" from a sitting position Cognitive Impairment Assessment: cognition, memory and judgment appear normal.  remembers 3/3 at 5 minutes.  excellent recall.  can easily read and write a sentence.  alert and oriented x 3   Assessment:   Medicare wellness utd on preventive parameters    Plan:   During the course of the visit the patient was educated and counseled about appropriate screening and preventive services including:        Fall prevention is advised today  Diabetes screening  Nutrition counseling is offered  advanced directives/end of life addressed today:  see healthcare directives hyperlink  Vaccines are updated as needed  Patient Instructions (the written plan) was given to the patient.

## 2017-10-06 NOTE — Progress Notes (Signed)
we discussed code status.  pt requests DNR 

## 2017-10-12 ENCOUNTER — Telehealth: Payer: Self-pay

## 2017-10-12 ENCOUNTER — Encounter (HOSPITAL_COMMUNITY): Payer: Self-pay | Admitting: Emergency Medicine

## 2017-10-12 ENCOUNTER — Emergency Department (HOSPITAL_COMMUNITY)
Admission: EM | Admit: 2017-10-12 | Discharge: 2017-10-12 | Disposition: A | Discharge status: Home / Self Care | Payer: Medicare Other | Attending: Emergency Medicine | Admitting: Emergency Medicine

## 2017-10-12 DIAGNOSIS — M545 Low back pain: Secondary | ICD-10-CM | POA: Diagnosis present

## 2017-10-12 DIAGNOSIS — G8929 Other chronic pain: Secondary | ICD-10-CM | POA: Insufficient documentation

## 2017-10-12 DIAGNOSIS — N189 Chronic kidney disease, unspecified: Secondary | ICD-10-CM | POA: Insufficient documentation

## 2017-10-12 DIAGNOSIS — Z79899 Other long term (current) drug therapy: Secondary | ICD-10-CM | POA: Diagnosis not present

## 2017-10-12 DIAGNOSIS — Z7901 Long term (current) use of anticoagulants: Secondary | ICD-10-CM | POA: Insufficient documentation

## 2017-10-12 DIAGNOSIS — I5032 Chronic diastolic (congestive) heart failure: Secondary | ICD-10-CM | POA: Diagnosis not present

## 2017-10-12 DIAGNOSIS — E1122 Type 2 diabetes mellitus with diabetic chronic kidney disease: Secondary | ICD-10-CM | POA: Insufficient documentation

## 2017-10-12 DIAGNOSIS — I130 Hypertensive heart and chronic kidney disease with heart failure and stage 1 through stage 4 chronic kidney disease, or unspecified chronic kidney disease: Secondary | ICD-10-CM | POA: Insufficient documentation

## 2017-10-12 DIAGNOSIS — Z8673 Personal history of transient ischemic attack (TIA), and cerebral infarction without residual deficits: Secondary | ICD-10-CM | POA: Insufficient documentation

## 2017-10-12 DIAGNOSIS — I2510 Atherosclerotic heart disease of native coronary artery without angina pectoris: Secondary | ICD-10-CM | POA: Insufficient documentation

## 2017-10-12 DIAGNOSIS — K649 Unspecified hemorrhoids: Secondary | ICD-10-CM | POA: Diagnosis not present

## 2017-10-12 DIAGNOSIS — M5440 Lumbago with sciatica, unspecified side: Secondary | ICD-10-CM | POA: Insufficient documentation

## 2017-10-12 LAB — CBC WITH DIFFERENTIAL/PLATELET
Basophils Absolute: 0 10*3/uL (ref 0.0–0.1)
Basophils Relative: 0 %
Eosinophils Absolute: 0.1 10*3/uL (ref 0.0–0.7)
Eosinophils Relative: 1 %
HEMATOCRIT: 38.3 % — AB (ref 39.0–52.0)
Hemoglobin: 12.8 g/dL — ABNORMAL LOW (ref 13.0–17.0)
Lymphocytes Relative: 13 %
Lymphs Abs: 0.9 10*3/uL (ref 0.7–4.0)
MCH: 32 pg (ref 26.0–34.0)
MCHC: 33.4 g/dL (ref 30.0–36.0)
MCV: 95.8 fL (ref 78.0–100.0)
MONO ABS: 0.7 10*3/uL (ref 0.1–1.0)
MONOS PCT: 10 %
NEUTROS ABS: 5.6 10*3/uL (ref 1.7–7.7)
Neutrophils Relative %: 76 %
Platelets: 147 10*3/uL — ABNORMAL LOW (ref 150–400)
RBC: 4 MIL/uL — ABNORMAL LOW (ref 4.22–5.81)
RDW: 14.3 % (ref 11.5–15.5)
WBC: 7.4 10*3/uL (ref 4.0–10.5)

## 2017-10-12 LAB — BASIC METABOLIC PANEL
ANION GAP: 10 (ref 5–15)
BUN: 19 mg/dL (ref 8–23)
CALCIUM: 9.2 mg/dL (ref 8.9–10.3)
CO2: 28 mmol/L (ref 22–32)
Chloride: 100 mmol/L (ref 98–111)
Creatinine, Ser: 1.51 mg/dL — ABNORMAL HIGH (ref 0.61–1.24)
GFR calc Af Amer: 49 mL/min — ABNORMAL LOW (ref >60)
GFR calc non Af Amer: 42 mL/min — ABNORMAL LOW (ref >60)
GLUCOSE: 151 mg/dL — AB (ref 70–99)
Potassium: 4.6 mmol/L (ref 3.5–5.1)
Sodium: 138 mmol/L (ref 135–145)

## 2017-10-12 MED ORDER — HYDROMORPHONE HCL 1 MG/ML IJ SOLN
0.5000 mg | Freq: Once | INTRAMUSCULAR | Status: AC
  Administered 2017-10-12: 0.5 mg via INTRAVENOUS
  Filled 2017-10-12: qty 1

## 2017-10-12 MED ORDER — LIDOCAINE HCL URETHRAL/MUCOSAL 2 % EX GEL: 1.0000 "application " | Freq: Once | CUTANEOUS | Status: DC

## 2017-10-12 NOTE — Telephone Encounter (Signed)
Spoke to patient and his wife, he has been having an increased difficult time with rectal bleeding. He is using Preparation H suppositories, Recticare, tried glycerin suppositories with hydrocortisone ointment. Today he has had two episodes of rectal bleeding with clots, not associated with a bowel movement, and then another with just bright red blood. Patient is on Eliquis. No APP appointments, no SOB or chest pain. Let patient know that he needs to be seen by ED or UC but would send Dr. Havery Moros message.

## 2017-10-12 NOTE — Telephone Encounter (Signed)
   [facility] Pre-operative Risk Assessment    Request for surgical clearance:  1. What type of surgery is being performed?  Nerve Boot Block   2. When is this surgery scheduled?  TBD   3. What type of clearance is required (medical clearance vs. Pharmacy clearance to hold med vs. Both)?  Both  4. Are there any medications that need to be held prior to surgery and how long? Eliquis 3 days   5. Practice name and name of physician performing surgery?  [facility]/Dr Maryjean Ka   6. What is your office phone number [phone]    7.   What is your office fax number [phone]  8.   Anesthesia type (None, local, MAC, general) ?    Legrand Como  Raphael Espe 10/12/2017, 2:03 PM  _________________________________________________________________   (provider comments below)

## 2017-10-12 NOTE — Telephone Encounter (Signed)
Spoke to Dr. Havery Moros, patient advised to go to ED for evaluation. I am keeping him on the schedule for 9/6.

## 2017-10-12 NOTE — ED Provider Notes (Signed)
[facility] DEPT Provider Note   CSN: [id number] Arrival date & time: 10/12/17  1816     History   Chief Complaint Chief Complaint  Patient presents with  . Back Pain  . Hemorrhoids    HPI RHET RORKE is a 77 y.o. male who presents with hemorrhoids and back pain. 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 He states that he's had issues with a hemorrhoid for the past 2 weeks. Today he started to have heavy bleeding when he had a BM with clots and then he went to the bathroom again and just blood came out.  He has been trying preparation H and OTC products for hemorrhoids without significant relief. He called Dr. Havery Moros with GI and was told to come to the ED because of low platelets.  Additionally he is in a lot of pain due to his back and is scheduled to see Dr. Ronnald Ramp for a nerve block at the end of October but he is having severe pain. He is taking multiple medicines but still has significant pain.  HPI  Past Medical History:  Diagnosis Date  . Bruises easily    d/t being on Eliquis  . Carotid artery disease ([place])    a. Duplex 05/2014: 35-36% RICA, 1-44% LICA, elevated velocities in right subclavian artery, normal left subclavian artery..  . Chronic back pain    HNP  . Chronic kidney disease (CKD)   . Claustrophobia    takes Ativan as needed  . 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 Coronary atherosclerosis of unspecified type of vessel, native or graft    a. cath in 2003 showed 10-20% LM, scattered 20% prox-mid LAD, 30% more distal LAD, 50-60% stenosis of LAD towards apex, 20% Cx, 30% dRCA, focal 95% stenosis in smaller of 2 branches of PDA, EF 60-65%.  . Diabetes mellitus ([place])   . Diarrhea    takes Imodium daily as needed  . Diverticulosis   . Diverticulosis of colon (without mention of hemorrhage)   . DJD (degenerative joint disease)   . DVT (deep venous thrombosis) ([place])  07/2011  . Esophageal stricture   . Essential hypertension    takes Imdur and Lisinopril daily  . Gastric polyps    benign  . GERD (gastroesophageal reflux disease)    takes Nexium daily  . Glaucoma    uses eye drops  . History of bronchitis    not sure when the last time  . History of colon polyps    benign  . History of kidney stones   . History of kidney stones   . Insomnia    takes Melatonin nightly as needed  . Mixed hyperlipidemia    takes Crestor daily  . Myasthenia gravis ([place])    uses prednisone  . Osteoporosis 2016  . Peripheral edema    takes Lasix daily  . Pituitary tumor    checks Dr[facility]Walden at [facility] checks it every Jan   . Pneumonia 01/2016  . PONV (postoperative nausea and vomiting)   . Ptosis   . Pulmonary embolus ([place]) 2012  . Stroke [facility]) 06/2015    Patient Active Problem List   Diagnosis Date Noted  . Nasal abscess 09/25/2017  . Immunocompromised ([place]) 09/25/2017  . Cellulitis of right axilla 09/25/2017  . Anemia 08/05/2017  . Transient ischemic attack (TIA) 07/23/2017  . Subcortical infarction ([place])   . Right hemiparesis ([place])   .  Ataxia 07/21/2017  . TIA (transient ischemic attack)   . Thrombocytopenia ([place])   . Wheezing 06/17/2017  . Bursitis of left elbow 05/01/2017  . Dystonia 12/29/2016  . PAD (peripheral artery disease) ([place]) 11/18/2016  . Osteoporosis 08/28/2016  . Paronychia of great toe, left 08/10/2016  . Carotid arterial disease ([place]) 07/09/2016  . Hypocalcemia 07/09/2016  . Diplopia 07/02/2016  . S/P lumbar laminectomy 03/11/2016  . Radicular pain of left lower extremity 02/21/2016  . Cough 01/10/2016  . 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 Myasthenia gravis ([place])   . Diabetes mellitus type 2 in nonobese (HCC)   . Coronary artery disease involving native coronary artery of native heart without angina pectoris   . Ataxia, post-stroke   . Gait disturbance, post-stroke   . Proximal leg weakness     . Cervical spondylosis without myelopathy   . Achilles tendinitis of right lower extremity   . Chronic diastolic CHF (congestive heart failure) ([place])   . Gastroesophageal reflux disease without esophagitis   . Chronic kidney disease   . Cerebral infarction involving left cerebellar artery ([place]) 06/20/2015  . Cerebellar stroke ([place])   . Ischemic stroke ([place])   . Cerebrovascular accident (CVA) due to thrombosis of left vertebral artery ([place])   . History of pulmonary embolus (PE)   . Chronic anticoagulation   . HLD (hyperlipidemia)   . Neck pain 03/19/2015  . Pain in the chest   . 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 Chronic diastolic heart failure, NYHA class 1 ([place]) 03/07/2015  . Diabetic gastroparesis ([place]) 03/07/2015  . Screening for osteoporosis 07/19/2014  . Dyspnea 04/09/2014  . Edema 04/09/2014  . Routine general medical examination at a health care facility 10/23/2013  . Bilateral carotid bruits 05/24/2013  . Abnormal CT scan, lung 05/24/2013  . Encounter for therapeutic drug monitoring 03/14/2013  . Lymphadenopathy, inguinal 01/11/2013  . AKI (acute kidney injury) ([place]) 10/28/2012  . 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 Myasthenia gravis with exacerbation ([place]) 03/31/2012  . Disease of salivary gland 12/09/2011  . Left facial pain 10/28/2011  . Pulmonary embolism ([place]) 10/25/2011  . 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 GERD with stricture 09/23/2011  . Long term (current) use of anticoagulants 08/03/2011  . History of pulmonary embolism 07/30/2011  . History of DVT (deep vein thrombosis) 07/30/2011  . Subconjunctival hemorrhage 06/22/2011  . Bilateral conjunctivitis 04/09/2011  . Diabetes mellitus type 2, controlled ([place]) 12/18/2010  . Hearing loss 08/04/2010  . Ptosis of eyelid 12/30/2009  . Essential hypertension 04/24/2009  . HYPERLIPIDEMIA, MIXED 10/05/2008  . HEMORRHOIDS-INTERNAL 07/02/2008  . HEMORRHOIDS-EXTERNAL  07/02/2008  . DIVERTICULOSIS-COLON 07/02/2008  . PERSONAL HX COLONIC POLYPS 07/02/2008  . RASH-NONVESICULAR 02/07/2008  . PNEUMONIA 12/08/2007  . Chronic myeloid leukemia in remission ([place]) 07/07/2007  . CAD (coronary artery disease) 07/07/2007  . NEPHROLITHIASIS, HX OF 07/07/2007    Past Surgical History:  Procedure Laterality Date  . ABDOMINAL AORTOGRAM W/LOWER EXTREMITY N/A 11/18/2016   Procedure: ABDOMINAL AORTOGRAM W/LOWER EXTREMITY;  Surgeon: [name], MD;  Location: [facility];  Service: Cardiovascular;  Laterality: N/A;  . BACK SURGERY     x2, lumbar and cervical  . CARDIAC CATHETERIZATION    . CATARACT EXTRACTION Bilateral 12/12  . COLONOSCOPY    . ESOPHAGOGASTRODUODENOSCOPY ENDOSCOPY  04/2015  . IR GENERIC HISTORICAL  03/03/2016   IR IVC FILTER PLMT / S&I [name] GUID/MOD SED 03/03/2016 Legrand Como  Annamaria Boots, MD [facility] RAD  . IR GENERIC HISTORICAL  04/09/2016   IR RADIOLOGIST EVAL & MGMT 04/09/2016 [name], MD [facility]  . IR GENERIC HISTORICAL  04/30/2016   IR IVC FILTER RETRIEVAL / S&I [name] GUID/MOD SED 04/30/2016 [name], MD [facility] RAD  . LITHOTRIPSY    . LUMBAR LAMINECTOMY/DECOMPRESSION MICRODISCECTOMY Right 09/14/2012   Procedure: Right Lumbar three-four, four-five, Lumbar five-Sacral one decompressive laminectomy;  Surgeon: [name], MD;  Location: [facility];  Service: Neurosurgery;  Laterality: Right;  . LUMBAR LAMINECTOMY/DECOMPRESSION MICRODISCECTOMY Left 03/11/2016   Procedure: Left Lumbar Two-ThreeMicrodiscectomy;  Surgeon: [name], MD;  Location: [facility];  Service: Neurosurgery;  Laterality: Left;  . PERIPHERAL VASCULAR INTERVENTION  11/18/2016   Procedure: PERIPHERAL VASCULAR INTERVENTION;  Surgeon: [name], MD;  Location: [facility];  Service: Cardiovascular;;  Left popliteal  . SHOULDER SURGERY Left 05/07/2009  . TONSILLECTOMY          Home Medications    Prior to Admission medications   Medication  Sig Start Date End Date Taking? Authorizing Provider  brimonidine-timolol (COMBIGAN) 0.2-0.5 % ophthalmic solution Place 1 drop into both eyes every 12 (twelve) hours.   Yes [provider]  cholecalciferol (VITAMIN D) 1000 units tablet Take 1,000 Units by mouth daily.    Yes [provider]  ELIQUIS 5 MG TABS tablet TAKE 1 TABLET (5 MG TOTAL) BY MOUTH 2 (TWO) TIMES DAILY. 12/09/15  Yes Kathrynn Ducking, MD  esomeprazole (NEXIUM) 40 MG capsule Take 1 capsule (40 mg total) by mouth daily. 09/29/17  Yes Charlesetta Shanks, MD  furosemide (LASIX) 20 MG tablet Take 1 tablet (20 mg total) by mouth daily. 09/27/17  Yes Renato Shin, MD  imatinib (GLEEVEC) 100 MG tablet Take 300 mg by mouth daily at 12 noon. Take with meals and large glass of water.Caution:Chemotherapy    Yes [provider]  isosorbide mononitrate (IMDUR) 30 MG 24 hr tablet Take 1 tablet (30 mg total) by mouth daily. 09/17/15  Yes Sherren Mocha, MD  lidocaine (LIDODERM) 5 % Place 1 patch onto the skin daily as needed (for back pain). Remove & Discard patch within 12 hours or as directed by MD    Yes [provider]  lisinopril (PRINIVIL,ZESTRIL) 10 MG tablet Take 1 tablet (10 mg total) by mouth daily. 09/27/17  Yes Renato Shin, MD  loperamide (IMODIUM) 2 MG capsule Take 2-4 mg by mouth 3 (three) times daily as needed for diarrhea or loose stools.    Yes [provider]  Multiple Vitamin (MULTIVITAMIN) tablet Take 1 tablet by mouth daily.   Yes [provider]  orphenadrine (NORFLEX) 100 MG tablet Take 1 tablet (100 mg total) by mouth 2 (two) times daily. 09/29/17  Yes Charlesetta Shanks, MD  oxyCODONE (OXY IR/ROXICODONE) 5 MG immediate release tablet Take 1 tablet (5 mg total) by mouth every 6 (six) hours as needed for moderate pain or severe pain. 09/29/17  Yes Charlesetta Shanks, MD  predniSONE (DELTASONE) 1 MG tablet Take 3 tablets (3 mg total) by mouth daily with breakfast. 06/23/17  Yes Kathrynn Ducking, MD  pyridostigmine (MESTINON) 60 MG tablet Take 60 mg by mouth daily.   Yes [provider]  ranitidine (ZANTAC) 150 MG tablet Take 150 mg by mouth daily at 12 noon.    Yes [provider]  rosuvastatin (CRESTOR) 10 MG tablet Take 5 mg by mouth daily.   Yes [provider]  doxycycline (  VIBRA-TABS) 100 MG tablet Take 1 tablet (100 mg total) by mouth 2 (two) times daily. Patient not taking: Reported on 10/12/2017 09/25/17   Jinny Sanders, MD  traMADol (ULTRAM) 50 MG tablet Take 2 tablets (100 mg total) by mouth every 8 (eight) hours. Patient not taking: Reported on 10/12/2017 06/28/17   Montine Circle, PA-C    Family History Family History  Problem Relation Age of Onset  . Heart disease Mother   . Heart attack Mother   . Stroke Mother   . Heart disease Father   . Heart attack Father   . Stroke Father   . Breast cancer Sister        Twin   . Heart attack Sister   . Dementia Sister   . Heart disease Sister   . Heart attack Sister   . Clotting disorder Sister   . Heart attack Sister   . Colon cancer Neg Hx     Social History Social History   Tobacco Use  . Smoking status: Never Smoker  . Smokeless tobacco: Never Used  Substance Use Topics  . Alcohol use: No    Alcohol/week: 0.0 standard drinks  . Drug use: No     Allergies   Phenergan [promethazine hcl]; Zofran [ondansetron hcl]; and Sulfamethoxazole   Review of Systems Review of Systems  Constitutional: Negative for fever.  Gastrointestinal: Positive for anal bleeding and blood in stool. Negative for abdominal pain.  Musculoskeletal: Positive for back pain.  Hematological: Bruises/bleeds easily.  All other systems reviewed and are negative.    Physical Exam Updated Vital Signs BP (!) 146/83   Pulse 92   Temp 99.4 F (37.4 C) (Oral)   Resp 19   SpO2 97%   Physical Exam  Constitutional: He is oriented to person, place, and time. He appears well-developed and  well-nourished. No distress.  Calm, cooperative. Appears to be very uncomfortable  HENT:  Head: Normocephalic and atraumatic.  Eyes: Pupils are equal, round, and reactive to light. Conjunctivae are normal. Right eye exhibits no discharge. Left eye exhibits no discharge. No scleral icterus.  Neck: Normal range of motion.  Cardiovascular: Normal rate and regular rhythm.  Pulmonary/Chest: Effort normal and breath sounds normal. No respiratory distress.  Abdominal: Soft. Bowel sounds are normal. He exhibits no distension. There is no tenderness.  Genitourinary:  Genitourinary Comments: Rectal: Large external hemorrhoid which is possibly thrombosed and tender. No active bleeding  Musculoskeletal:  Large midline scar over lumbar spine. 5/5 lower extremity strength  Neurological: He is alert and oriented to person, place, and time.  Skin: Skin is warm and dry.  Psychiatric: He has a normal mood and affect. His behavior is normal.  Nursing note and vitals reviewed.    ED Treatments / Results  Labs (all labs ordered are listed, but only abnormal results are displayed) Labs Reviewed  BASIC METABOLIC PANEL - Abnormal; Notable for the following components:      Result Value   Glucose, Bld 151 (*)    Creatinine, Ser 1.51 (*)    GFR calc non Af Amer 42 (*)    GFR calc Af Amer 49 (*)    All other components within normal limits  CBC WITH DIFFERENTIAL/PLATELET - Abnormal; Notable for the following components:   RBC 4.00 (*)    Hemoglobin 12.8 (*)    HCT 38.3 (*)    Platelets 147 (*)    All other components within normal limits    EKG None  Radiology No  results found.  Procedures Procedures (including critical care time)  Medications Ordered in ED Medications  HYDROmorphone (DILAUDID) injection 0.5 mg (0.5 mg Intravenous Given 10/12/17 2026)     Initial Impression / Assessment and Plan / ED Course  I have reviewed the triage vital signs and the nursing notes.  Pertinent labs &  imaging results that were available during my care of the patient were reviewed by me and considered in my medical decision making (see chart for details).  77 year old male with bleeding hemorrhoid and acute on chronic back pain. He is hypertensive but otherwise vitals are normal. He was recently seen for back pain and had an MRI of his lumbar spine which showed multilevel degenerative disc disease. He is being followed by Dr. Ronnald Ramp but is concerned because his pain is so severe he feels he cannot wait for 5 weeks for relief. He was given a dose of dilaudid and had good relief with this. His hemorrhoid does appear large and possibly thrombosed but states it actually feels better than before. It is not actively bleeding. CBC is improved from the 19th. Advised sitz baths, hydrocortisone cream, tucks pads. They have a f/u with Dr. Havery Moros on Friday.   Final Clinical Impressions(s) / ED Diagnoses   Final diagnoses:  Hemorrhoids, unspecified hemorrhoid type  Chronic midline low back pain with sciatica, sciatica laterality unspecified    ED Discharge Orders    None       Recardo Evangelist, PA-C 10/12/17 2258    Daleen Bo, MD 10/13/17 [phone]

## 2017-10-12 NOTE — Discharge Instructions (Addendum)
Please do sitz baths (sit in a warm bath to help relieve rectal pain) Continue preparation H cream (make sure it has hydrocortisone cream)\ Try TUCKs pads which are over the counter Please follow up with Dr. Havery Moros

## 2017-10-12 NOTE — Telephone Encounter (Signed)
If he is having significant bleeding, unclear if this is hemorrhoidal or other, he should be evaluated with CBC and an exam, ensure no true lower GI bleed especially given his anticoagulation and thrombocytopenia. Will keep his visit with me on Friday but given his bleeding as described he should be evaluated sooner by ED or urgent care

## 2017-10-12 NOTE — ED Notes (Signed)
ED Provider at bedside. 

## 2017-10-12 NOTE — ED Provider Notes (Signed)
  Face-to-face evaluation   History: He presents for evaluation of a bleeding hemorrhoid, which occurred today.  He has noticed a knot at that site for several months, today it bled a moderate amount of blood clots.  The discomfort improved after he bled.  He denies fever, chills, nausea or vomiting.  He has chronic low back pain and is working on getting a nerve block for it.  Physical exam: Elderly, alert and cooperative.  Anus right-sided posterior hemorrhoid, 3 x 1.5 cm, soft, tender, it is not tensely swollen.  No active bleeding or drainage.  Remainder of the anal architecture is normal.  Medical screening examination/treatment/procedure(s) were conducted as a shared visit with non-physician practitioner(s) and myself.  I personally evaluated the patient during the encounter    Daleen Bo, MD 10/13/17 [phone]

## 2017-10-12 NOTE — ED Triage Notes (Signed)
Patient here from home with complaints of bleeding hemorrhoids. On a blood thinner, Xeralto. States that he is suppose to have back surgery in October but cannot tolerate the pain. Reports lower back pain radiating down into left hip.

## 2017-10-13 NOTE — Telephone Encounter (Signed)
   Primary Cardiologist: Sherren Mocha, MD  Chart reviewed as part of pre-operative protocol coverage. Patient was contacted 10/13/2017 in reference to pre-operative risk assessment for pending surgery as outlined below.  [name] was last seen on 05/11/17 by Dr. Fletcher [name] for PVD.  Since that day, [name] has done well. He is getting > 4 mets of activity.   Therefore, based on ACC/AHA guidelines, the patient would be at acceptable risk for the planned procedure without further cardiovascular testing.   Patient takes Elqiuis for DVT/PE. Also has hx of stroke. Personally reviewed with Dr. Burt [name] >> Ok to hold Eliquis 3 days.   I will route this recommendation to the requesting party via Epic fax function and remove from pre-op pool.  Please call with questions.  [name], [place] 10/13/2017, 1:55 PM

## 2017-10-13 NOTE — Telephone Encounter (Signed)
Spoke with Domonique @ [facility], she states that pt will not receive any sedation, they will use only Lidocaine.

## 2017-10-13 NOTE — Telephone Encounter (Signed)
Covering staff, can you find out type of anesthesia?

## 2017-10-14 ENCOUNTER — Institutional Professional Consult (permissible substitution): Payer: Medicare Other | Admitting: Neurology

## 2017-10-14 ENCOUNTER — Encounter

## 2017-10-15 ENCOUNTER — Ambulatory Visit (INDEPENDENT_AMBULATORY_CARE_PROVIDER_SITE_OTHER): Payer: Medicare Other | Admitting: Gastroenterology

## 2017-10-15 ENCOUNTER — Encounter: Payer: Self-pay | Admitting: Gastroenterology

## 2017-10-15 ENCOUNTER — Telehealth: Payer: Self-pay | Admitting: Cardiovascular Disease

## 2017-10-15 VITALS — BP 164/78 | HR 84 | Ht 65.75 in | Wt 166.5 lb

## 2017-10-15 DIAGNOSIS — Z8673 Personal history of transient ischemic attack (TIA), and cerebral infarction without residual deficits: Secondary | ICD-10-CM

## 2017-10-15 DIAGNOSIS — I639 Cerebral infarction, unspecified: Secondary | ICD-10-CM | POA: Diagnosis not present

## 2017-10-15 DIAGNOSIS — K645 Perianal venous thrombosis: Secondary | ICD-10-CM

## 2017-10-15 DIAGNOSIS — D132 Benign neoplasm of duodenum: Secondary | ICD-10-CM

## 2017-10-15 NOTE — Patient Instructions (Addendum)
If you are age 79 or older, your body mass index should be between 23-30. Your Body mass index is 27.08 kg/m. If this is out of the aforementioned range listed, please consider follow up with your Primary Care Provider.  If you are age 76 or younger, your body mass index should be between 19-25. Your Body mass index is 27.08 kg/m. If this is out of the aformentioned range listed, please consider follow up with your Primary Care Provider.   You have been given a handout for Sitz baths - Do 2-3 times daily.  Continue Recticare and Hydrocortisone cream.  If not better, call our office.  Follow up in three months.  Please call the office for an appointment as the schedule is not available at this time.  Thank you for choosing me and [facility].   Porterdale Cellar, MD

## 2017-10-15 NOTE — Telephone Encounter (Signed)
Left message to call back  

## 2017-10-15 NOTE — Progress Notes (Signed)
HPI :  77 year old male here for follow-up visit for rectal bleeding. 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 Unfortunately he was admitted and had another stroke this past June.  He remains on a liquids. Over the past week he had a few episodes of what he thought was a fair amount of rectal bleeding and discomfort in his anal area. Seen in the ER on 9/3 given his bleeding and history of thrombocytopenia, his hemoglobin was stable at 12.8, platelet count was 147. He reports his bowels are regular, denies any constipation or straining. He's been using Preparation H, rectal care, and hydrocortisone on top of glycerin suppositories. He's had no more bleeding since that visit. He has continued to take Eliquis.  He endorses ongoing pain in his lower back and left leg. He reports having a nerve block scheduled for next week, and may need more back surgery. He states he is stopping his Eliquis for a few days next week.  He is following up with his oncologist next week. He does endorse 16 pound weight loss over the past 2 months. He states he is eating well. 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  Last colonoscopy 12/26/2013 - diverticulosis, otherwise normal EGD 04/23/15 - normal esophagus without pathology to cause dysphagia. Benign stomach polyps. 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  Past Medical History:  Diagnosis Date  . Bruises easily    d/t being on Eliquis  . Carotid artery disease ([place])    a. Duplex 05/2014: 50-35% RICA, 4-65% LICA, elevated velocities in right subclavian artery, normal left subclavian artery..  . Chronic back pain    HNP  . Chronic kidney disease  (CKD)   . Claustrophobia    takes Ativan as needed  . CML (chronic myeloid leukemia) ([place])    takes Gleevec daily  . Coronary atherosclerosis of unspecified type of vessel, native or graft    a. cath in 2003 showed 10-20% LM, scattered 20% prox-mid LAD, 30% more distal LAD, 50-60% stenosis of LAD towards apex, 20% Cx, 30% dRCA, focal 95% stenosis in smaller of 2 branches of PDA, EF 60-65%.  . Diabetes mellitus ([place])   . Diarrhea    takes Imodium daily as needed  . Diverticulosis   . Diverticulosis of colon (without mention of hemorrhage)   . DJD (degenerative joint disease)   . DVT (deep venous thrombosis) ([place]) 07/2011  . Esophageal stricture   . Essential hypertension    takes Imdur and Lisinopril daily  . Gastric polyps    benign  . GERD (gastroesophageal reflux disease)    takes Nexium daily  . Glaucoma    uses eye drops  . History of bronchitis    not sure when the last time  . History of colon polyps    benign  . History of kidney stones   . History of kidney stones   . Insomnia    takes Melatonin nightly as needed  . Mixed hyperlipidemia    takes Crestor daily  . Myasthenia gravis ([place])    uses prednisone  . Osteoporosis 2016  . Peripheral edema    takes Lasix daily  . 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 Pneumonia 01/2016  . PONV (postoperative nausea and vomiting)   . Ptosis   . Pulmonary embolus ([place]) 2012  . Stroke [facility]) 06/2015     Past Surgical History:  Procedure Laterality Date  . ABDOMINAL AORTOGRAM W/LOWER EXTREMITY N/A 11/18/2016   Procedure: ABDOMINAL AORTOGRAM W/LOWER EXTREMITY;  Surgeon: [name], MD;  Location: [place] [facility];  Service: Cardiovascular;  Laterality: N/A;  . BACK SURGERY     x2, lumbar and cervical  . CARDIAC CATHETERIZATION    . CATARACT EXTRACTION Bilateral 12/12  . COLONOSCOPY    . ESOPHAGOGASTRODUODENOSCOPY ENDOSCOPY  04/2015  . IR GENERIC HISTORICAL  03/03/2016   IR IVC  FILTER PLMT / S&I [name] GUID/MOD SED 03/03/2016 [name], MD [facility]INTER[facility] RAD  . IR GENERIC HISTORICAL  04/09/2016   IR RADIOLOGIST EVAL & MGMT 04/09/2016 [name], MD [facility] INTERV RAD  . IR GENERIC HISTORICAL  04/30/2016   IR IVC FILTER RETRIEVAL / S&I [name] GUID/MOD SED 04/30/2016 [name], MD [facility]-INTERV RAD  . LITHOTRIPSY    . LUMBAR LAMINECTOMY/DECOMPRESSION MICRODISCECTOMY Right 09/14/2012   Procedure: Right Lumbar three-four, four-five, Lumbar five-Sacral one decompressive laminectomy;  Surgeon: [name], MD;  Location: [place] [facility];  Service: Neurosurgery;  Laterality: Right;  . LUMBAR LAMINECTOMY/DECOMPRESSION MICRODISCECTOMY Left 03/11/2016   Procedure: Left Lumbar Two-ThreeMicrodiscectomy;  Surgeon: [name], MD;  Location: [place];  Service: Neurosurgery;  Laterality: Left;  . PERIPHERAL VASCULAR INTERVENTION  11/18/2016   Procedure: PERIPHERAL VASCULAR INTERVENTION;  Surgeon: [name], MD;  Location: [place] [facility];  Service: Cardiovascular;;  Left popliteal  . SHOULDER SURGERY Left 05/07/2009  . TONSILLECTOMY     Family History  Problem Relation Age of Onset  . Heart disease Mother   . Heart attack Mother   . Stroke Mother   . Heart disease Father   . Heart attack Father   . Stroke Father   . Breast cancer Sister        Twin   . Heart attack Sister   . Dementia Sister   . Heart disease Sister   . Heart attack Sister   . Clotting disorder Sister   . Heart attack Sister   . Colon cancer Neg Hx    Social History   Tobacco Use  . Smoking status: Never Smoker  . Smokeless tobacco: Never Used  Substance Use Topics  . Alcohol use: No    Alcohol/week: 0.0 standard drinks  . Drug use: No   Current Outpatient Medications  Medication Sig Dispense Refill  . brimonidine-timolol (COMBIGAN) 0.2-0.5 % ophthalmic solution Place 1 drop into both eyes every 12 (twelve) hours.    . cholecalciferol (VITAMIN D) 1000 units tablet Take 1,000 Units by mouth  daily.     Marland Kitchen ELIQUIS 5 MG TABS tablet TAKE 1 TABLET (5 MG TOTAL) BY MOUTH 2 (TWO) TIMES DAILY. 60 tablet 3  . esomeprazole (NEXIUM) 40 MG capsule Take 1 capsule (40 mg total) by mouth daily. 30 capsule 0  . furosemide (LASIX) 20 MG tablet Take 1 tablet (20 mg total) by mouth daily. 90 tablet 3  . imatinib (GLEEVEC) 100 MG tablet Take 300 mg by mouth daily at 12 noon. Take with meals and large glass of water.Caution:Chemotherapy     . isosorbide mononitrate (IMDUR) 30 MG 24 hr tablet Take 1 tablet (30 mg total) by mouth daily. 90 tablet 3  . lidocaine (LIDODERM) 5 %  Place 1 patch onto the skin daily as needed (for back pain). Remove & Discard patch within 12 hours or as directed by MD     . lisinopril (PRINIVIL,ZESTRIL) 10 MG tablet Take 1 tablet (10 mg total) by mouth daily. 30 tablet 11  . loperamide (IMODIUM) 2 MG capsule Take 2-4 mg by mouth 3 (three) times daily as needed for diarrhea or loose stools.     . Multiple Vitamin (MULTIVITAMIN) tablet Take 1 tablet by mouth daily.    . orphenadrine (NORFLEX) 100 MG tablet Take 1 tablet (100 mg total) by mouth 2 (two) times daily. 30 tablet 0  . oxyCODONE (OXY IR/ROXICODONE) 5 MG immediate release tablet Take 1 tablet (5 mg total) by mouth every 6 (six) hours as needed for moderate pain or severe pain. 30 tablet 0  . predniSONE (DELTASONE) 1 MG tablet Take 3 tablets (3 mg total) by mouth daily with breakfast. 270 tablet 1  . pyridostigmine (MESTINON) 60 MG tablet Take 60 mg by mouth daily.    . ranitidine (ZANTAC) 150 MG tablet Take 150 mg by mouth daily at 12 noon.     . rosuvastatin (CRESTOR) 10 MG tablet Take 5 mg by mouth daily.     No current facility-administered medications for this visit.    Allergies  Allergen Reactions  . Phenergan [Promethazine Hcl] Swelling and Other (See Comments)    Cannot be combined with Zofran because swelling of the eyes resulted and patient became very restless-  . Zofran [Ondansetron Hcl] Swelling and Other  (See Comments)    Cannot be combined with Phenergan because swelling of the eyes resulted and patient became very restless-  . Sulfamethoxazole Rash and Itching     Review of Systems: All systems reviewed and negative except where noted in HPI.    Mr Lumbar Spine Wo Contrast  Result Date: 09/29/2017 CLINICAL DATA:  Low back pain radiating to the left leg. EXAM: MRI LUMBAR SPINE WITHOUT CONTRAST TECHNIQUE: Multiplanar, multisequence MR imaging of the lumbar spine was performed. No intravenous contrast was administered. COMPARISON:  Radiography 09/24/2017.  MRI 02/22/2016. FINDINGS: Segmentation:  5 lumbar type vertebral bodies. Alignment:  5 mm anterolisthesis L4-5. Vertebrae:  No fracture or primary bone lesion. Conus medullaris and cauda equina: Conus extends to the L1 level. Conus and cauda equina appear normal. Paraspinal and other soft tissues: Negative Disc levels: T11-12, T12-L1 and L1-2: No significant finding. L2-3: Previous left hemilaminectomy. Broad-based disc herniation with caudal migration of a disc fragment behind L3 centrally and slightly towards the left. Small migrated fragment upward to the right of midline. Stenosis of both lateral recesses and neural foramina. Caudally migrated fragment could focally affect the left L3 nerve. L3-4: Previous right hemilaminectomy. Right posterolateral disc herniation with caudal down turning centrally and to the right of midline. Foraminal narrowing on the right could affect the exiting right L3 nerve. Right-sided nerve root compression could occur in the right lateral recess. L4-5: Advanced bilateral facet arthropathy with 5 mm of anterolisthesis. Broad-based disc herniation more prominent towards the left with upward migration. Severe multifactorial stenosis of the lateral recesses at this level. The disc material could in particular cause neural compression on the left in the lateral recess and intervertebral foramen. L5-S1: Prominent anterior disc  herniation. Moderate posterior disc herniation more prominent towards the left with caudal down turning of the fragment. Foraminal narrowing on the right could affect the exiting L5 nerve. Likely compressive narrowing of the left lateral recess and intervertebral foramen.  Caudally migrated fragment likely affects the left S1 nerve. Compared to the study of 2018, all findings were present to a degree at that time but have worsened. 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 Findings were all present at the time of the previous study but have worsened. Electronically Signed   By: Nelson Chimes M.D.   On: 09/29/2017 09:33    Lab Results  Component Value Date   WBC 7.4 10/12/2017   HGB 12.8 (L) 10/12/2017   HCT 38.3 (L) 10/12/2017   MCV 95.8 10/12/2017   PLT 147 (L) 10/12/2017    Lab Results  Component Value Date   CREATININE 1.51 (H) 10/12/2017   BUN 19 10/12/2017   NA 138 10/12/2017   K 4.6 10/12/2017   CL 100 10/12/2017   CO2 28 10/12/2017    Lab Results  Component Value Date   ALT 32 07/24/2017   AST 37 07/24/2017   ALKPHOS 45 07/24/2017   BILITOT 0.8 07/24/2017     Physical Exam: BP (!) 164/78 (BP Location: Left Arm, Patient Position: Sitting, Cuff Size: Normal)   Pulse 84 Comment: irregular  Ht 5' 5.75" (1.67 m)   Wt 166 lb 8 oz (75.5 kg)   BMI 27.08 kg/m  Constitutional: Pleasant,well-developed, male in no acute distress. HEENT: Normocephalic and atraumatic. Conjunctivae are normal. No scleral icterus. Neck supple.  Cardiovascular: Normal rate, regular rhythm.  Pulmonary/chest: Effort normal and breath sounds normal. No wheezing, rales or rhonchi. Abdominal: Soft, nondistended, nontender. There are no masses palpable. No hepatomegaly. DRE - thrombosed external hemorrhoid with punctum noted in posterior anal canal, painful to palpation, DRE without mass  lesions Extremities: no edema Lymphadenopathy: No cervical adenopathy noted. Neurological: Alert and oriented to person place and time. Skin: Skin is warm and dry. No rashes noted. Psychiatric: Normal mood and affect. Behavior is normal.   ASSESSMENT AND PLAN: 77 year old male here for assessment of following issues:  Thrombosed external hemorrhoid / history of CVA / anticoagulated - symptoms are worse in recent days, this appears to be improving with time and conservative therapy.  Recommend he continue conservative therapy at this time, he is out of the time window for surgical therapy, further he is not a good surgical candidate at this time given his ongoing need for anticoagulation and recent stroke. Recommend sitz bath a few times a day, he can continue topical hydrocortisone applied with glycerin suppository, and recticare. Moving forward, again given his need for anticoagulation I would try to avoid interventions on the hemorrhoids unless he is having recurrent severe bleeding or recurrent thrombosis. He agreed.  History of duodenal adenoma - he does warrant a surveillance upper endoscopy for this at some point in time. Given his recent stroke and upcoming possible back intervention, we'll hold off on this for now, I will see him again in 3 months for reassessment. This exam can be done on anticoagulation for surveillance purposes.   Patient agreed with plan as outlined, all questions answered.  [place] Cellar, MD [facility]

## 2017-10-15 NOTE — Telephone Encounter (Signed)
New message   Patient's wife is calling to reschedule the appt on 10/25/2017 due to another medical appt on same day. I advised that Dr. Burt Knack does not have another appt available and ask if wanted to setup appt w/PA. The patient's wife refused. Please call to discuss.

## 2017-10-15 NOTE — Telephone Encounter (Signed)
Mrs. Prokop cancelled 9/16 appointment because the patient has another appointment he cannot miss. Rescheduled patient 11/1 with Dr. Burt Knack. She understands to call if they need assistance prior to that time.

## 2017-10-18 ENCOUNTER — Other Ambulatory Visit: Payer: Self-pay | Admitting: Neurology

## 2017-10-18 NOTE — Telephone Encounter (Signed)
Per note from Dr. Loanne Drilling on 9/2, he was called again to try to schedule a reclast infusion.  He denied saying that he is having back surgery next week, and wants to wait until after this, before scheduling.  He was told to call me when he is feeling better after his surgery.  He agreed to do this.

## 2017-10-25 ENCOUNTER — Ambulatory Visit: Payer: Medicare Other | Admitting: Cardiovascular Disease

## 2017-10-25 DIAGNOSIS — M5416 Radiculopathy, lumbar region: Secondary | ICD-10-CM | POA: Diagnosis not present

## 2017-10-28 ENCOUNTER — Telehealth: Payer: Self-pay | Admitting: Gastroenterology

## 2017-10-28 NOTE — Telephone Encounter (Signed)
Agree with your recommendation. Would use Pepsid instead. Thanks

## 2017-10-28 NOTE — Telephone Encounter (Signed)
Spoke to patient about the FDA report on Zantac. Patient states that the Zantac has been helpful, he had stopped taking the Nexium and was taking Zantac once a day at noon. Advised patient that FDA is still investigating this medication, only slightly higher levels [place] than what is normally found in our food and water. Advised patient to stop the Zantac and switch to Pepcid.

## 2017-10-30 ENCOUNTER — Emergency Department (HOSPITAL_COMMUNITY): Payer: Medicare Other

## 2017-10-30 ENCOUNTER — Other Ambulatory Visit: Payer: Self-pay

## 2017-10-30 ENCOUNTER — Encounter (HOSPITAL_COMMUNITY): Payer: Self-pay | Admitting: Emergency Medicine

## 2017-10-30 ENCOUNTER — Inpatient Hospital Stay (HOSPITAL_COMMUNITY)
Admission: EM | Admit: 2017-10-30 | Discharge: 2017-11-06 | DRG: 549 | Disposition: A | Discharge status: Home Health | Payer: Medicare Other | Attending: Internal Medicine | Admitting: Internal Medicine

## 2017-10-30 DIAGNOSIS — Z79899 Other long term (current) drug therapy: Secondary | ICD-10-CM

## 2017-10-30 DIAGNOSIS — Z66 Do not resuscitate: Secondary | ICD-10-CM | POA: Diagnosis not present

## 2017-10-30 DIAGNOSIS — M19011 Primary osteoarthritis, right shoulder: Secondary | ICD-10-CM | POA: Diagnosis not present

## 2017-10-30 DIAGNOSIS — M47892 Other spondylosis, cervical region: Secondary | ICD-10-CM | POA: Diagnosis present

## 2017-10-30 DIAGNOSIS — N189 Chronic kidney disease, unspecified: Secondary | ICD-10-CM | POA: Diagnosis present

## 2017-10-30 DIAGNOSIS — Z86718 Personal history of other venous thrombosis and embolism: Secondary | ICD-10-CM

## 2017-10-30 DIAGNOSIS — G8929 Other chronic pain: Secondary | ICD-10-CM | POA: Diagnosis present

## 2017-10-30 DIAGNOSIS — G9520 Unspecified cord compression: Secondary | ICD-10-CM | POA: Diagnosis present

## 2017-10-30 DIAGNOSIS — G959 Disease of spinal cord, unspecified: Secondary | ICD-10-CM | POA: Diagnosis not present

## 2017-10-30 DIAGNOSIS — Z86711 Personal history of pulmonary embolism: Secondary | ICD-10-CM

## 2017-10-30 DIAGNOSIS — M009 Pyogenic arthritis, unspecified: Principal | ICD-10-CM | POA: Diagnosis present

## 2017-10-30 DIAGNOSIS — M5011 Cervical disc disorder with radiculopathy,  high cervical region: Secondary | ICD-10-CM | POA: Diagnosis not present

## 2017-10-30 DIAGNOSIS — F40240 Claustrophobia: Secondary | ICD-10-CM | POA: Diagnosis present

## 2017-10-30 DIAGNOSIS — E1159 Type 2 diabetes mellitus with other circulatory complications: Secondary | ICD-10-CM | POA: Diagnosis not present

## 2017-10-30 DIAGNOSIS — Z888 Allergy status to other drugs, medicaments and biological substances status: Secondary | ICD-10-CM

## 2017-10-30 DIAGNOSIS — L03221 Cellulitis of neck: Secondary | ICD-10-CM | POA: Diagnosis not present

## 2017-10-30 DIAGNOSIS — E782 Mixed hyperlipidemia: Secondary | ICD-10-CM | POA: Diagnosis present

## 2017-10-30 DIAGNOSIS — B370 Candidal stomatitis: Secondary | ICD-10-CM | POA: Diagnosis not present

## 2017-10-30 DIAGNOSIS — E1122 Type 2 diabetes mellitus with diabetic chronic kidney disease: Secondary | ICD-10-CM | POA: Diagnosis present

## 2017-10-30 DIAGNOSIS — T380X5A Adverse effect of glucocorticoids and synthetic analogues, initial encounter: Secondary | ICD-10-CM | POA: Diagnosis not present

## 2017-10-30 DIAGNOSIS — M5412 Radiculopathy, cervical region: Secondary | ICD-10-CM | POA: Diagnosis not present

## 2017-10-30 DIAGNOSIS — H9190 Unspecified hearing loss, unspecified ear: Secondary | ICD-10-CM | POA: Diagnosis present

## 2017-10-30 DIAGNOSIS — R402360 Coma scale, best motor response, obeys commands, unspecified time: Secondary | ICD-10-CM | POA: Diagnosis present

## 2017-10-30 DIAGNOSIS — I129 Hypertensive chronic kidney disease with stage 1 through stage 4 chronic kidney disease, or unspecified chronic kidney disease: Secondary | ICD-10-CM | POA: Diagnosis present

## 2017-10-30 DIAGNOSIS — I10 Essential (primary) hypertension: Secondary | ICD-10-CM | POA: Diagnosis present

## 2017-10-30 DIAGNOSIS — Z7952 Long term (current) use of systemic steroids: Secondary | ICD-10-CM | POA: Diagnosis not present

## 2017-10-30 DIAGNOSIS — M25512 Pain in left shoulder: Secondary | ICD-10-CM | POA: Diagnosis not present

## 2017-10-30 DIAGNOSIS — R0902 Hypoxemia: Secondary | ICD-10-CM | POA: Diagnosis not present

## 2017-10-30 DIAGNOSIS — E119 Type 2 diabetes mellitus without complications: Secondary | ICD-10-CM

## 2017-10-30 DIAGNOSIS — K219 Gastro-esophageal reflux disease without esophagitis: Secondary | ICD-10-CM | POA: Diagnosis present

## 2017-10-30 DIAGNOSIS — R072 Precordial pain: Secondary | ICD-10-CM | POA: Diagnosis not present

## 2017-10-30 DIAGNOSIS — I2510 Atherosclerotic heart disease of native coronary artery without angina pectoris: Secondary | ICD-10-CM | POA: Diagnosis present

## 2017-10-30 DIAGNOSIS — M4712 Other spondylosis with myelopathy, cervical region: Secondary | ICD-10-CM | POA: Diagnosis not present

## 2017-10-30 DIAGNOSIS — H409 Unspecified glaucoma: Secondary | ICD-10-CM | POA: Diagnosis present

## 2017-10-30 DIAGNOSIS — E1151 Type 2 diabetes mellitus with diabetic peripheral angiopathy without gangrene: Secondary | ICD-10-CM | POA: Diagnosis present

## 2017-10-30 DIAGNOSIS — M810 Age-related osteoporosis without current pathological fracture: Secondary | ICD-10-CM | POA: Diagnosis present

## 2017-10-30 DIAGNOSIS — M19012 Primary osteoarthritis, left shoulder: Secondary | ICD-10-CM | POA: Diagnosis not present

## 2017-10-30 DIAGNOSIS — M542 Cervicalgia: Secondary | ICD-10-CM | POA: Diagnosis not present

## 2017-10-30 DIAGNOSIS — L02818 Cutaneous abscess of other sites: Secondary | ICD-10-CM | POA: Diagnosis not present

## 2017-10-30 DIAGNOSIS — I471 Supraventricular tachycardia: Secondary | ICD-10-CM | POA: Diagnosis not present

## 2017-10-30 DIAGNOSIS — M5489 Other dorsalgia: Secondary | ICD-10-CM | POA: Diagnosis not present

## 2017-10-30 DIAGNOSIS — K222 Esophageal obstruction: Secondary | ICD-10-CM

## 2017-10-30 DIAGNOSIS — G7000 Myasthenia gravis without (acute) exacerbation: Secondary | ICD-10-CM | POA: Diagnosis present

## 2017-10-30 DIAGNOSIS — L0291 Cutaneous abscess, unspecified: Secondary | ICD-10-CM

## 2017-10-30 DIAGNOSIS — Z794 Long term (current) use of insulin: Secondary | ICD-10-CM

## 2017-10-30 DIAGNOSIS — Z8673 Personal history of transient ischemic attack (TIA), and cerebral infarction without residual deficits: Secondary | ICD-10-CM

## 2017-10-30 DIAGNOSIS — D649 Anemia, unspecified: Secondary | ICD-10-CM | POA: Diagnosis present

## 2017-10-30 DIAGNOSIS — R402250 Coma scale, best verbal response, oriented, unspecified time: Secondary | ICD-10-CM | POA: Diagnosis present

## 2017-10-30 DIAGNOSIS — Z7901 Long term (current) use of anticoagulants: Secondary | ICD-10-CM

## 2017-10-30 DIAGNOSIS — R002 Palpitations: Secondary | ICD-10-CM | POA: Diagnosis not present

## 2017-10-30 DIAGNOSIS — Z882 Allergy status to sulfonamides status: Secondary | ICD-10-CM

## 2017-10-30 DIAGNOSIS — G992 Myelopathy in diseases classified elsewhere: Secondary | ICD-10-CM | POA: Diagnosis not present

## 2017-10-30 DIAGNOSIS — M4802 Spinal stenosis, cervical region: Secondary | ICD-10-CM | POA: Diagnosis present

## 2017-10-30 DIAGNOSIS — E1165 Type 2 diabetes mellitus with hyperglycemia: Secondary | ICD-10-CM | POA: Diagnosis not present

## 2017-10-30 DIAGNOSIS — Z95820 Peripheral vascular angioplasty status with implants and grafts: Secondary | ICD-10-CM

## 2017-10-30 DIAGNOSIS — C9211 Chronic myeloid leukemia, BCR/ABL-positive, in remission: Secondary | ICD-10-CM | POA: Diagnosis present

## 2017-10-30 DIAGNOSIS — M5031 Other cervical disc degeneration,  high cervical region: Secondary | ICD-10-CM | POA: Diagnosis not present

## 2017-10-30 DIAGNOSIS — M6289 Other specified disorders of muscle: Secondary | ICD-10-CM | POA: Diagnosis not present

## 2017-10-30 DIAGNOSIS — R402140 Coma scale, eyes open, spontaneous, unspecified time: Secondary | ICD-10-CM | POA: Diagnosis present

## 2017-10-30 LAB — URINALYSIS, ROUTINE W REFLEX MICROSCOPIC
Bilirubin Urine: NEGATIVE
GLUCOSE, UA: NEGATIVE mg/dL
Hgb urine dipstick: NEGATIVE
Ketones, ur: NEGATIVE mg/dL
LEUKOCYTES UA: NEGATIVE
Nitrite: NEGATIVE
PH: 6 (ref 5.0–8.0)
PROTEIN: NEGATIVE mg/dL
Specific Gravity, Urine: 1.021 (ref 1.005–1.030)

## 2017-10-30 LAB — BASIC METABOLIC PANEL
Anion gap: 10 (ref 5–15)
BUN: 19 mg/dL (ref 8–23)
CALCIUM: 8.5 mg/dL — AB (ref 8.9–10.3)
CHLORIDE: 101 mmol/L (ref 98–111)
CO2: 25 mmol/L (ref 22–32)
CREATININE: 1.35 mg/dL — AB (ref 0.61–1.24)
GFR calc Af Amer: 56 mL/min — ABNORMAL LOW (ref >60)
GFR calc non Af Amer: 48 mL/min — ABNORMAL LOW (ref >60)
Glucose, Bld: 120 mg/dL — ABNORMAL HIGH (ref 70–99)
POTASSIUM: 3.5 mmol/L (ref 3.5–5.1)
SODIUM: 136 mmol/L (ref 135–145)

## 2017-10-30 LAB — C-REACTIVE PROTEIN: CRP: 3.2 mg/dL — ABNORMAL HIGH (ref <1.0)

## 2017-10-30 LAB — CBC WITH DIFFERENTIAL/PLATELET
Abs Immature Granulocytes: 0.2 10*3/uL — ABNORMAL HIGH (ref 0.0–0.1)
BASOS PCT: 0 %
Basophils Absolute: 0 10*3/uL (ref 0.0–0.1)
Eosinophils Absolute: 0 10*3/uL (ref 0.0–0.7)
Eosinophils Relative: 0 %
HCT: 35.1 % — ABNORMAL LOW (ref 39.0–52.0)
HEMOGLOBIN: 11.6 g/dL — AB (ref 13.0–17.0)
IMMATURE GRANULOCYTES: 1 %
LYMPHS PCT: 11 %
Lymphs Abs: 1.5 10*3/uL (ref 0.7–4.0)
MCH: 31.4 pg (ref 26.0–34.0)
MCHC: 33 g/dL (ref 30.0–36.0)
MCV: 94.9 fL (ref 78.0–100.0)
MONOS PCT: 13 %
Monocytes Absolute: 1.7 10*3/uL — ABNORMAL HIGH (ref 0.1–1.0)
NEUTROS PCT: 75 %
Neutro Abs: 10.3 10*3/uL — ABNORMAL HIGH (ref 1.7–7.7)
PLATELETS: 186 10*3/uL (ref 150–400)
RBC: 3.7 MIL/uL — ABNORMAL LOW (ref 4.22–5.81)
RDW: 13.8 % (ref 11.5–15.5)
WBC: 13.8 10*3/uL — ABNORMAL HIGH (ref 4.0–10.5)

## 2017-10-30 LAB — SEDIMENTATION RATE: Sed Rate: 28 mm/hr — ABNORMAL HIGH (ref 0–16)

## 2017-10-30 LAB — I-STAT CG4 LACTIC ACID, ED: Lactic Acid, Venous: 1.12 mmol/L (ref 0.5–1.9)

## 2017-10-30 MED ORDER — VANCOMYCIN HCL IN DEXTROSE 1-5 GM/200ML-% IV SOLN
1000.0000 mg | Freq: Once | INTRAVENOUS | Status: AC
  Administered 2017-10-30: 1000 mg via INTRAVENOUS
  Filled 2017-10-30: qty 200

## 2017-10-30 MED ORDER — SODIUM CHLORIDE 0.9 % IV BOLUS
1000.0000 mL | Freq: Once | INTRAVENOUS | Status: AC
  Administered 2017-10-30: 1000 mL via INTRAVENOUS

## 2017-10-30 MED ORDER — IMATINIB MESYLATE 100 MG PO TABS
300.0000 mg | ORAL_TABLET | Freq: Every day | ORAL | Status: DC
  Administered 2017-10-31 – 2017-11-05 (×6): 300 mg via ORAL
  Filled 2017-10-30 (×10): qty 3

## 2017-10-30 MED ORDER — ADULT MULTIVITAMIN W/MINERALS CH
1.0000 | ORAL_TABLET | Freq: Every day | ORAL | Status: DC
  Administered 2017-10-31 – 2017-11-06 (×7): 1 via ORAL
  Filled 2017-10-30 (×7): qty 1

## 2017-10-30 MED ORDER — LOPERAMIDE HCL 2 MG PO CAPS: 2.0000 mg | ORAL_CAPSULE | Freq: Three times a day (TID) | ORAL | Status: DC | PRN

## 2017-10-30 MED ORDER — BRIMONIDINE TARTRATE-TIMOLOL 0.2-0.5 % OP SOLN: 1.0000 [drp] | Freq: Two times a day (BID) | OPHTHALMIC | Status: DC

## 2017-10-30 MED ORDER — PYRIDOSTIGMINE BROMIDE 60 MG PO TABS
60.0000 mg | ORAL_TABLET | Freq: Every day | ORAL | Status: DC
  Administered 2017-10-31 – 2017-11-06 (×6): 60 mg via ORAL
  Filled 2017-10-30 (×8): qty 1

## 2017-10-30 MED ORDER — HYDROMORPHONE HCL 1 MG/ML IJ SOLN
1.0000 mg | Freq: Once | INTRAMUSCULAR | Status: AC
  Administered 2017-10-30: 1 mg via INTRAVENOUS
  Filled 2017-10-30: qty 1

## 2017-10-30 MED ORDER — ISOSORBIDE MONONITRATE ER 30 MG PO TB24
30.0000 mg | ORAL_TABLET | Freq: Every day | ORAL | Status: DC
  Administered 2017-10-31 – 2017-11-06 (×7): 30 mg via ORAL
  Filled 2017-10-30 (×8): qty 1

## 2017-10-30 MED ORDER — CYCLOBENZAPRINE HCL 10 MG PO TABS
5.0000 mg | ORAL_TABLET | Freq: Once | ORAL | Status: AC
  Administered 2017-10-30: 5 mg via ORAL
  Filled 2017-10-30: qty 1

## 2017-10-30 MED ORDER — METHYLPREDNISOLONE SODIUM SUCC 125 MG IJ SOLR
80.0000 mg | Freq: Four times a day (QID) | INTRAMUSCULAR | Status: DC
  Administered 2017-10-30 – 2017-10-31 (×2): 80 mg via INTRAVENOUS
  Filled 2017-10-30 (×3): qty 2

## 2017-10-30 MED ORDER — TIMOLOL MALEATE 0.5 % OP SOLN
1.0000 [drp] | Freq: Two times a day (BID) | OPHTHALMIC | Status: DC
  Administered 2017-10-30 – 2017-11-06 (×14): 1 [drp] via OPHTHALMIC
  Filled 2017-10-30: qty 5

## 2017-10-30 MED ORDER — HEPARIN BOLUS VIA INFUSION
1500.0000 [IU] | Freq: Once | INTRAVENOUS | Status: AC
  Administered 2017-10-30: 1500 [IU] via INTRAVENOUS
  Filled 2017-10-30: qty 1500

## 2017-10-30 MED ORDER — ROSUVASTATIN CALCIUM 5 MG PO TABS
5.0000 mg | ORAL_TABLET | Freq: Every day | ORAL | Status: DC
  Administered 2017-10-31 – 2017-11-06 (×7): 5 mg via ORAL
  Filled 2017-10-30 (×7): qty 1

## 2017-10-30 MED ORDER — SODIUM CHLORIDE 0.9 % IV SOLN
2.0000 g | INTRAVENOUS | Status: DC
  Administered 2017-10-30: 2 g via INTRAVENOUS
  Filled 2017-10-30 (×2): qty 2

## 2017-10-30 MED ORDER — BRIMONIDINE TARTRATE 0.2 % OP SOLN
1.0000 [drp] | Freq: Two times a day (BID) | OPHTHALMIC | Status: DC
  Administered 2017-10-30 – 2017-11-06 (×14): 1 [drp] via OPHTHALMIC
  Filled 2017-10-30: qty 5

## 2017-10-30 MED ORDER — LISINOPRIL 10 MG PO TABS
10.0000 mg | ORAL_TABLET | Freq: Every day | ORAL | Status: DC
  Administered 2017-10-31 – 2017-11-06 (×7): 10 mg via ORAL
  Filled 2017-10-30 (×7): qty 1

## 2017-10-30 MED ORDER — ONDANSETRON HCL 4 MG PO TABS: 4.0000 mg | ORAL_TABLET | Freq: Four times a day (QID) | ORAL | Status: DC | PRN

## 2017-10-30 MED ORDER — OXYCODONE HCL 5 MG PO TABS
5.0000 mg | ORAL_TABLET | Freq: Four times a day (QID) | ORAL | Status: DC | PRN
  Administered 2017-10-30 – 2017-11-06 (×13): 5 mg via ORAL
  Filled 2017-10-30 (×13): qty 1

## 2017-10-30 MED ORDER — VITAMIN D 1000 UNITS PO TABS
1000.0000 [IU] | ORAL_TABLET | Freq: Every day | ORAL | Status: DC
  Administered 2017-10-31 – 2017-11-06 (×7): 1000 [IU] via ORAL
  Filled 2017-10-30 (×7): qty 1

## 2017-10-30 MED ORDER — FAMOTIDINE 20 MG PO TABS
20.0000 mg | ORAL_TABLET | Freq: Two times a day (BID) | ORAL | Status: DC
  Administered 2017-10-30 – 2017-11-06 (×14): 20 mg via ORAL
  Filled 2017-10-30 (×14): qty 1

## 2017-10-30 MED ORDER — ONDANSETRON HCL 4 MG/2ML IJ SOLN: 4.0000 mg | Freq: Four times a day (QID) | INTRAMUSCULAR | Status: DC | PRN

## 2017-10-30 MED ORDER — GADOBUTROL 1 MMOL/ML IV SOLN
7.0000 mL | Freq: Once | INTRAVENOUS | Status: AC | PRN
  Administered 2017-10-30: 7 mL via INTRAVENOUS

## 2017-10-30 MED ORDER — FUROSEMIDE 20 MG PO TABS
20.0000 mg | ORAL_TABLET | Freq: Every day | ORAL | Status: DC
  Administered 2017-10-31 – 2017-11-06 (×7): 20 mg via ORAL
  Filled 2017-10-30 (×7): qty 1

## 2017-10-30 MED ORDER — HYDROMORPHONE HCL 1 MG/ML IJ SOLN
0.5000 mg | Freq: Once | INTRAMUSCULAR | Status: AC
  Administered 2017-10-30: 0.5 mg via INTRAVENOUS
  Filled 2017-10-30: qty 1

## 2017-10-30 MED ORDER — SODIUM CHLORIDE 0.9 % IV SOLN: 1.0000 g | Freq: Once | INTRAVENOUS | Status: DC

## 2017-10-30 MED ORDER — SODIUM CHLORIDE 0.9 % IV SOLN
INTRAVENOUS | Status: DC
  Administered 2017-10-30: 23:00:00 via INTRAVENOUS

## 2017-10-30 MED ORDER — DEXAMETHASONE SODIUM PHOSPHATE 10 MG/ML IJ SOLN
4.0000 mg | Freq: Three times a day (TID) | INTRAMUSCULAR | Status: DC
  Administered 2017-10-30 – 2017-10-31 (×2): 4 mg via INTRAVENOUS
  Filled 2017-10-30 (×2): qty 1

## 2017-10-30 MED ORDER — LIDOCAINE 5 % EX PTCH: 1.0000 | MEDICATED_PATCH | Freq: Every day | CUTANEOUS | Status: DC | PRN

## 2017-10-30 MED ORDER — LORAZEPAM 2 MG/ML IJ SOLN
1.0000 mg | INTRAMUSCULAR | Status: DC | PRN
  Administered 2017-10-30: 1 mg via INTRAVENOUS
  Filled 2017-10-30: qty 1

## 2017-10-30 MED ORDER — HEPARIN (PORCINE) IN NACL 100-0.45 UNIT/ML-% IJ SOLN
1150.0000 [IU]/h | INTRAMUSCULAR | Status: DC
  Administered 2017-10-30: 1300 [IU]/h via INTRAVENOUS
  Filled 2017-10-30 (×4): qty 250

## 2017-10-30 NOTE — Progress Notes (Signed)
Pharmacy Antibiotic Note  [name] is a 77 y.o. male admitted on 10/30/2017 with sepsis.  Pharmacy has been consulted for sepsis dosing.  CC/HPI: L neck pain, sharp/shooting, joint injection his left hip earlier this week.    PMH: CAD, diabetes, spinal stenosis, leukemia in remission, back surgery, carotid dz, chronic back pian, CKD, diverticulosis, DJD, DVT 2013, esophageal stricture, GERD, HTN, glaucoma, insomnia, HLD, myasthenia gravis, OP, peripheral edema, pituitary tumor, ptosis, PE 2012, CVA 5/17.  Significant events: Recent nasal abscess and cellulits of R axilla both 8/18.  Plan: Cefepime 2g IV q 24h  Height: 5\' 7"  (170.2 cm) Weight: 175 lb (79.4 kg) IBW/kg (Calculated) : 66.1  Temp (24hrs), Avg:99.9 F (37.7 C), Min:99 F (37.2 C), Max:100.2 F (37.9 C)  Recent Labs  Lab 10/30/17 0838 10/30/17 0849  WBC 13.8*  --   CREATININE 1.35*  --   LATICACIDVEN  --  1.12    Estimated Creatinine Clearance: 44.8 mL/min (A) (by C-G formula based on SCr of 1.35 mg/dL (H)).    Allergies  Allergen Reactions  . Phenergan [Promethazine Hcl] Swelling and Other (See Comments)    Cannot be combined with Zofran because swelling of the eyes resulted and patient became very restless-  . Zofran [Ondansetron Hcl] Swelling and Other (See Comments)    Cannot be combined with Phenergan because swelling of the eyes resulted and patient became very restless-  . Sulfamethoxazole Rash and Itching    Gorge Almanza S. Alford Highland, PharmD, BCPS Clinical Staff Pharmacist [phone] Eilene Ghazi [facility] 10/30/2017 11:46 AM

## 2017-10-30 NOTE — Consult Note (Signed)
Reason for Consult:Unrelenting neck pain Referring Physician: [name], [name] is an 77 y.o. male.  HPI: 77 year old male with history of CAD, diabetes, spinal stenosis, leukemia in remission brought here via EMS for evaluation of neck pain.  Patient report around 10 PM last night while he was sitting he developed acute onset of sharp shooting pain primarily to the left side of his neck and shoulder.  Pain is intense, 10 out of 10, worsening with movement, shooting down towards his shoulder.  Pain is very uncomfortable, he is unable to sleep.  He denies any specific treatment tried.  He denies any significant headache, arm weakness, pain in his chest, trouble breathing, or back pain.  He report having a "bad back" which required surgeries in the past.  His neurosurgeon is Dr. Sherley Bounds.  He also report receiving a joint injection his left hip earlier this week.  Patient denies any fever night sweats or weight loss.  He denies any recent strenuous activity or anything that precipitates his neck pain.  I was called to see patient after 16 hours in ER after extended attempts to manage patient's pain did not improve his discomfort.  He has large HNP C 45 with cord compression and also HNP C 34 with less severe stenosis.  Had prior bilateral C 67 foraminotomies in 1980's by Dr. Rolin Barry and has had low back surgery by Dr. Ronnald Ramp.    Past Medical History:  Diagnosis Date  . Bruises easily    d/t being on Eliquis  . Carotid artery disease ([place])    a. Duplex 05/2014: 34-74% RICA, 2-59% LICA, elevated velocities in right subclavian artery, normal left subclavian artery..  . Chronic back pain    HNP  . Chronic kidney disease (CKD)   . Claustrophobia    takes Ativan as needed  . 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 Coronary atherosclerosis of unspecified type of vessel, native or graft    a. cath in 2003 showed 10-20% LM, scattered 20% prox-mid LAD, 30% more distal LAD,  50-60% stenosis of LAD towards apex, 20% Cx, 30% dRCA, focal 95% stenosis in smaller of 2 branches of PDA, EF 60-65%.  . Diabetes mellitus ([place])   . Diarrhea    takes Imodium daily as needed  . Diverticulosis   . Diverticulosis of colon (without mention of hemorrhage)   . DJD (degenerative joint disease)   . DVT (deep venous thrombosis) ([place]) 07/2011  . Esophageal stricture   . Essential hypertension    takes Imdur and Lisinopril daily  . Gastric polyps    benign  . GERD (gastroesophageal reflux disease)    takes Nexium daily  . Glaucoma    uses eye drops  . History of bronchitis    not sure when the last time  . History of colon polyps    benign  . History of kidney stones   . History of kidney stones   . Insomnia    takes Melatonin nightly as needed  . Mixed hyperlipidemia    takes Crestor daily  . Myasthenia gravis ([place])    uses prednisone  . Osteoporosis 2016  . Peripheral edema    takes Lasix daily  . Pituitary tumor    checks Dr[facility]Walden at [facility] checks it every Jan   . Pneumonia 01/2016  . PONV (postoperative nausea and vomiting)   . Ptosis   . Pulmonary embolus ([place]) 2012  . Stroke [facility]) 06/2015  Past Surgical History:  Procedure Laterality Date  . ABDOMINAL AORTOGRAM W/LOWER EXTREMITY N/A 11/18/2016   Procedure: ABDOMINAL AORTOGRAM W/LOWER EXTREMITY;  Surgeon: [name], MD;  Location: [place] [facility];  Service: Cardiovascular;  Laterality: N/A;  . BACK SURGERY     x2, lumbar and cervical  . CARDIAC CATHETERIZATION    . CATARACT EXTRACTION Bilateral 12/12  . COLONOSCOPY    . ESOPHAGOGASTRODUODENOSCOPY ENDOSCOPY  04/2015  . IR GENERIC HISTORICAL  03/03/2016   IR IVC FILTER PLMT / S&I [name] GUID/MOD SED 03/03/2016 [name], MD [facility]  . IR GENERIC HISTORICAL  04/09/2016   IR RADIOLOGIST EVAL & MGMT 04/09/2016 [name], MD [facility] INTER[facility] RAD  . IR GENERIC HISTORICAL  04/30/2016   IR IVC FILTER RETRIEVAL / S&I [name] GUID/MOD SED  04/30/2016 [name], MD [facility]INTER[facility] RAD  . LITHOTRIPSY    . LUMBAR LAMINECTOMY/DECOMPRESSION MICRODISCECTOMY Right 09/14/2012   Procedure: Right Lumbar three-four, four-five, Lumbar five-Sacral one decompressive laminectomy;  Surgeon: [name], MD;  Location: [facility];  Service: Neurosurgery;  Laterality: Right;  . LUMBAR LAMINECTOMY/DECOMPRESSION MICRODISCECTOMY Left 03/11/2016   Procedure: Left Lumbar Two-ThreeMicrodiscectomy;  Surgeon: [name], MD;  Location: [facility];  Service: Neurosurgery;  Laterality: Left;  . PERIPHERAL VASCULAR INTERVENTION  11/18/2016   Procedure: PERIPHERAL VASCULAR INTERVENTION;  Surgeon: [name], MD;  Location: [place] [facility];  Service: Cardiovascular;;  Left popliteal  . SHOULDER SURGERY Left 05/07/2009  . TONSILLECTOMY      Family History  Problem Relation Age of Onset  . Heart disease Mother   . Heart attack Mother   . Stroke Mother   . Heart disease Father   . Heart attack Father   . Stroke Father   . Breast cancer Sister        Twin   . Heart attack Sister   . Dementia Sister   . Heart disease Sister   . Heart attack Sister   . Clotting disorder Sister   . Heart attack Sister   . Colon cancer Neg Hx     Social History:  reports that he has never smoked. He has never used smokeless tobacco. He reports that he does not drink alcohol or use drugs.  Allergies:  Allergies  Allergen Reactions  . Phenergan [Promethazine Hcl] Swelling and Other (See Comments)    Cannot be combined with Zofran because swelling of the eyes resulted and patient became very restless-  . Zofran [Ondansetron Hcl] Swelling and Other (See Comments)    Cannot be combined with Phenergan because swelling of the eyes resulted and patient became very restless-  . Sulfamethoxazole Rash and Itching    Medications: I have reviewed the patient's current medications.  Results for orders placed or performed during the hospital encounter of 10/30/17 (from  the past 48 hour(s))  CBC with Differential/Platelet     Status: Abnormal   Collection Time: 10/30/17  8:38 AM  Result Value Ref Range   WBC 13.8 (H) 4.0 - 10.5 K/uL   RBC 3.70 (L) 4.22 - 5.81 MIL/uL   Hemoglobin 11.6 (L) 13.0 - 17.0 g/dL   HCT 35.1 (L) 39.0 - 52.0 %   MCV 94.9 78.0 - 100.0 fL   MCH 31.4 26.0 - 34.0 pg   MCHC 33.0 30.0 - 36.0 g/dL   RDW 13.8 11.5 - 15.5 %   Platelets 186 150 - 400 K/uL   Neutrophils Relative % 75 %   Neutro Abs 10.3 (H)  1.7 - 7.7 K/uL   Lymphocytes Relative 11 %   Lymphs Abs 1.5 0.7 - 4.0 K/uL   Monocytes Relative 13 %   Monocytes Absolute 1.7 (H) 0.1 - 1.0 K/uL   Eosinophils Relative 0 %   Eosinophils Absolute 0.0 0.0 - 0.7 K/uL   Basophils Relative 0 %   Basophils Absolute 0.0 0.0 - 0.1 K/uL   Immature Granulocytes 1 %   Abs Immature Granulocytes 0.2 (H) 0.0 - 0.1 K/uL    Comment: Performed at [facility] [address], [place], [place] [address]  Basic metabolic panel     Status: Abnormal   Collection Time: 10/30/17  8:38 AM  Result Value Ref Range   Sodium 136 135 - 145 mmol/L   Potassium 3.5 3.5 - 5.1 mmol/L   Chloride 101 98 - 111 mmol/L   CO2 25 22 - 32 mmol/L   Glucose, Bld 120 (H) 70 - 99 mg/dL   BUN 19 8 - 23 mg/dL   Creatinine, Ser 1.35 (H) 0.61 - 1.24 mg/dL   Calcium 8.5 (L) 8.9 - 10.3 mg/dL   GFR calc non Af Amer 48 (L) >60 mL/min   GFR calc Af Amer 56 (L) >60 mL/min    Comment: (NOTE) The eGFR has been calculated using the CKD EPI equation. This calculation has not been validated in all clinical situations. eGFR's persistently <60 mL/min signify possible Chronic Kidney Disease.    Anion gap 10 5 - 15    Comment: Performed at [facility] [address], [place], [place] [address]  Sedimentation rate     Status: Abnormal   Collection Time: 10/30/17  8:38 AM  Result Value Ref Range   Sed Rate 28 (H) 0 - 16 mm/hr    Comment: Performed at [facility] [address] [place], [place] [address]   C-reactive protein     Status: Abnormal   Collection Time: 10/30/17  8:38 AM  Result Value Ref Range   CRP 3.2 (H) <1.0 mg/dL    Comment: Performed at [facility] [address], [place], [place] [address]  I-Stat CG4 Lactic Acid, ED     Status: None   Collection Time: 10/30/17  8:49 AM  Result Value Ref Range   Lactic Acid, Venous 1.12 0.5 - 1.9 mmol/L  Urinalysis, Routine w reflex microscopic     Status: None   Collection Time: 10/30/17  8:24 PM  Result Value Ref Range   Color, Urine YELLOW YELLOW   APPearance CLEAR CLEAR   Specific Gravity, Urine 1.021 1.005 - 1.030   pH 6.0 5.0 - 8.0   Glucose, UA NEGATIVE NEGATIVE mg/dL   Hgb urine dipstick NEGATIVE NEGATIVE   Bilirubin Urine NEGATIVE NEGATIVE   Ketones, ur NEGATIVE NEGATIVE mg/dL   Protein, ur NEGATIVE NEGATIVE mg/dL   Nitrite NEGATIVE NEGATIVE   Leukocytes, UA NEGATIVE NEGATIVE    Comment: Performed at [facility] [address] [place], [place] [address]   *Note: Due to a large number of results and/or encounters for the requested time period, some results have not been displayed. A complete set of results can be found in Results Review.    Ct Cervical Spine Wo Contrast  Result Date: 10/30/2017 CLINICAL DATA:  Left-sided neck pain. Painful range of motion. Pain extends into the left hip and leg. EXAM: CT CERVICAL SPINE WITHOUT CONTRAST TECHNIQUE: Multidetector CT imaging of the cervical spine was performed without intravenous contrast.  Multiplanar CT image reconstructions were also generated. COMPARISON:  CT angiogram of the neck dated 07/19/2017 FINDINGS: Alignment: Chronic 2 mm spondylolisthesis at C7-T1. Skull base and vertebrae: No acute fracture. No primary bone lesion or focal pathologic process. Soft tissues and spinal canal: No prevertebral fluid or swelling. No visible canal hematoma. Disc levels: C1-2: Chronic hypertrophy of the soft tissues adjacent to the transverse ligament posterior to the  odontoid process, asymmetrically prominent to the right with slight compression of the thecal sac to the right of midline best demonstrated on image 27 of series 4. The appearance is essentially unchanged. C2-3: Disc osteophyte complex extends into the left lateral recess and left neural foramen without severe foraminal stenosis. Moderate left facet arthritis. C3-4: Disc space narrowing. Broad-based disc osteophyte complex asymmetric to the left with narrowing of both lateral recesses severe bilateral foraminal stenosis, unchanged. Narrowing of the AP dimension of the spinal canal to approximately 7 mm, unchanged. Moderate left facet arthritis. C4-5: Disc space narrowing. Broad-based disc osteophyte complex with lobulated disc extrusion extending inferiorly behind the body of C5, essentially unchanged since the prior study. The extrusion compresses the right lateral recess and could affect the right C6 nerve. Severe chronic bilateral foraminal stenosis. Moderate bilateral facet arthritis. C5-6: Minimal broad-based disc osteophyte complex without neural impingement. Widely patent neural foramina. Minimal left facet arthritis. C6-7: Previous bilateral laminotomies. Osteophytes fuse the C6-7 vertebral bodies. Calcification of posterior longitudinal ligament narrows the AP dimension of the spinal canal. Widely patent neural foramina. C7-T1: Moderate bilateral facet arthritis. 2 mm spondylolisthesis with disc space narrowing. Broad-based disc osteophyte complex without neural impingement. C7-T1: Disc space narrowing. Moderate right facet arthritis. Moderate right foraminal stenosis. Upper chest: Negative. Other: None IMPRESSION: 1. No acute abnormality of the cervical spine. No appreciable change since the prior CT angiogram of the neck dated 07/19/2017. 2. 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 3. Chronic soft disc extrusion at C4-5  asymmetric to the right as described. Electronically Signed   By: Lorriane Shire M.D.   On: 10/30/2017 08:05   Mr Cervical Spine W Or Wo Contrast  Result Date: 10/30/2017 CLINICAL DATA:  Neck pain. Infection suspected. Diabetes. Chronic kidney disease. EXAM: MRI CERVICAL SPINE WITHOUT AND WITH CONTRAST TECHNIQUE: Multiplanar and multiecho pulse sequences of the cervical spine, to include the craniocervical junction and cervicothoracic junction, were obtained without and with intravenous contrast. CONTRAST:  Gadavist, 7 mL. COMPARISON:  CT cervical spine 10/30/2017.  CT head 07/19/2017. FINDINGS: Alignment: Anatomic except for 3 mm anterolisthesis C7-T1. Vertebrae: No worrisome osseous lesion. No evidence of diskitis, or focal bone lesion. Cord: Multilevel cord compression, described below. No abnormal cord signal. Posterior Fossa, vertebral arteries, paraspinal tissues: No tonsillar herniation. Chronic LEFT cerebellar infarct. No paraspinous effusion or signs of infection. Marked pannus extends behind the odontoid greater on the RIGHT as far cephalad as the foramen magnum. There is no significant cervicomedullary compression. Disc levels: C2-3: Annular bulge. Asymmetric facet arthropathy to the LEFT. No stenosis but LEFT C3 foraminal narrowing is noted. C3-4: Disc space narrowing. Central disc extrusion, with moderate cord flattening canal diameter 5-6 mm. Facet arthropathy. BILATERAL C4 foraminal narrowing, greater on the LEFT. C4-5: Disc space narrowing. Central disc extrusion, caudally migrated free fragments central and to the RIGHT. Severe cord compression, canal diameter 4-5 mm. BILATERAL C5 foraminal narrowing is compounded by facet arthropathy, worse on the RIGHT. C5-6: Disc desiccation. Shallow protrusion. Uncinate spurring and  facet arthropathy in LEFT result in C6 foraminal narrowing. C6-7: Disc space narrowing. Central protrusion. Slight cord flattening. No definite C7 foraminal narrowing. C7-T1: 3  mm anterolisthesis. Central disc extrusion with osseous spurring. Severe facet arthropathy. Mild stenosis without cord flattening. LEFT C8 foraminal narrowing. Post infusion no concerning abnormal enhancement. IMPRESSION: Multilevel spondylosis, with areas of significant spinal stenosis at C3-4 and C4-5 due to disc herniations. Cord compression with canal diameter as narrow as 4 mm. No signs of spinal infection, either diskitis or septic facet joint arthritis. Marked pannus, but no cervicomedullary compression. Electronically Signed   By: Staci Righter M.D.   On: 10/30/2017 17:49    Review of Systems - Negative except recent CVA, diabetes, low back pain, tremors    Blood pressure 132/85, pulse 99, temperature (S) 100.1 F (37.8 C), temperature source Rectal, resp. rate 16, height 5' 7"  (1.702 m), weight 79.4 kg, SpO2 98 %. Physical Exam  Constitutional: He is oriented to person, place, and time. He appears well-developed and well-nourished.  HENT:  Head: Normocephalic and atraumatic.  Eyes: Pupils are equal, round, and reactive to light. EOM are normal.  Neck: Muscular tenderness present. Neck rigidity present.  Neurological: He is alert and oriented to person, place, and time. No cranial nerve deficit or sensory deficit. GCS eye subscore is 4. GCS verbal subscore is 5. GCS motor subscore is 6. He displays no Babinski's sign on the right side. He displays no Babinski's sign on the left side.  Reflex Scores:      Tricep reflexes are 2+ on the right side and 2+ on the left side.      Bicep reflexes are 2+ on the right side and 2+ on the left side.      Brachioradialis reflexes are 2+ on the right side and 2+ on the left side.      Patellar reflexes are 3+ on the right side and 3+ on the left side. Severe pain left lateral neck, positive Spurling's to left.  Deltoid 4-/5 left.  Full strength all other motor groups.  Marked pain with ROM of neck, not with passive ROM of  shoulder.  Assessment/Plan: Patient has severe cervical stenosis with left arm pain and weakness involving his left deltoid and severe neck pain.  He is diabetic.  I recommend Medicine admission, start decadron 4 mg Q 8 hours, correct CBG's, will need to come off Eliquis and be treated for UTI, be optimized medically and cleared for surgery.  Patient will need ACDF C 34 and corpectomy C 5 to decompress his spinal cord.  Patient requests that Dr. Ronnald Ramp performs his surgery.  I will get him ready for surgery and have let Dr. Ronnald Ramp know about his admission and his current condition.  Peggyann Shoals, MD 10/30/2017, 8:44 PM

## 2017-10-30 NOTE — ED Notes (Signed)
  Attempted to call report.  RN will call me back. 

## 2017-10-30 NOTE — ED Triage Notes (Signed)
Patient arrived with EMS from home reports left lateral nec pain worse with movement/changing positions , pain radiating down to his left hip/left leg . Denies injury or fall .

## 2017-10-30 NOTE — ED Notes (Signed)
Patient transported to MRI 

## 2017-10-30 NOTE — H&P (Signed)
History and Physical   [name] QPR:[id number] DOB: 1940/04/15 DOA: 10/30/2017  Referring MD/NP/PA: Margarita Mail, PA  PCP: Renato Shin, MD   Outpatient Specialists: Dr. Sherley Bounds neurosurgery  Patient coming from: Home  Chief Complaint: Neck pain  HPI: [name] is a 77 y.o. male with medical history significant of diabetes, myasthenia gravis, CML in remission, spinal stenosis, coronary artery disease, recent pulmonary embolism with DVT on chronic anticoagulation with Eliquis, who presented to the ER with sudden onset of sharp neck and shoulder pain since 10:00 last night.  Pain is intense rated as 10 out of 10.  Worsened with activities and movement.  Not relieved by rest.  Patient has taken medications at home with no relief.  Is primarily more towards the left side.  The pain is shooting down his shoulder and making him very uncomfortable and not able to do anything at home.  Patient came to the ER where he was evaluated.  He was seen by neurosurgery and may require surgical intervention.  He is got significant cord compression in the cervical spine.  He is therefore being admitted for treatment.  ED Course: Temperature is 100.2, blood pressure one 6/72, pulse 123, respiratory rate of 28 and oxygen sat 98% on room air.  His white count is 13.8, hemoglobin 11.6, platelet 186.  Creatinine is 1.35 with calcium 8.5 and glucose 120.  BUN is 19.  MRI of the C-spine showed Multilevel spondylosis, with areas of significant spinal stenosis at C3-4 and C4-5 due to disc herniations. Cord compression with canal diameter as narrow as 4 mm. No signs of spinal infection, either diskitis or septic facet joint arthritis. Marked pannus, but no cervicomedullary compression.   Review of Systems: As per HPI otherwise 10 point review of systems negative.    Past Medical History:  Diagnosis Date  . Bruises easily    d/t being on Eliquis  . Carotid artery disease ([place])    a. Duplex 05/2014:  99-35% RICA, 7-01% LICA, elevated velocities in right subclavian artery, normal left subclavian artery..  . Chronic back pain    HNP  . Chronic kidney disease (CKD)   . Claustrophobia    takes Ativan as needed  . 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 Coronary atherosclerosis of unspecified type of vessel, native or graft    a. cath in 2003 showed 10-20% LM, scattered 20% prox-mid LAD, 30% more distal LAD, 50-60% stenosis of LAD towards apex, 20% Cx, 30% dRCA, focal 95% stenosis in smaller of 2 branches of PDA, EF 60-65%.  . Diabetes mellitus ([place])   . Diarrhea    takes Imodium daily as needed  . Diverticulosis   . Diverticulosis of colon (without mention of hemorrhage)   . DJD (degenerative joint disease)   . DVT (deep venous thrombosis) ([place]) 07/2011  . Esophageal stricture   . Essential hypertension    takes Imdur and Lisinopril daily  . Gastric polyps    benign  . GERD (gastroesophageal reflux disease)    takes Nexium daily  . Glaucoma    uses eye drops  . History of bronchitis    not sure when the last time  . History of colon polyps    benign  . History of kidney stones   . History of kidney stones   . Insomnia    takes Melatonin nightly as needed  . Mixed hyperlipidemia    takes Crestor daily  . Myasthenia  gravis ([place])    uses prednisone  . Osteoporosis 2016  . Peripheral edema    takes Lasix daily  . Pituitary tumor    checks Dr.Walden at [facility] checks it every Jan   . Pneumonia 01/2016  . PONV (postoperative nausea and vomiting)   . Ptosis   . Pulmonary embolus ([place]) 2012  . Stroke [facility]) 06/2015    Past Surgical History:  Procedure Laterality Date  . ABDOMINAL AORTOGRAM W/LOWER EXTREMITY N/A 11/18/2016   Procedure: ABDOMINAL AORTOGRAM W/LOWER EXTREMITY;  Surgeon: [name], MD;  Location: [place] [facility];  Service: Cardiovascular;  Laterality: N/A;  . BACK SURGERY     x2, lumbar and cervical  . CARDIAC  CATHETERIZATION    . CATARACT EXTRACTION Bilateral 12/12  . COLONOSCOPY    . ESOPHAGOGASTRODUODENOSCOPY ENDOSCOPY  04/2015  . IR GENERIC HISTORICAL  03/03/2016   IR IVC FILTER PLMT / S&I [name] GUID/MOD SED 03/03/2016 [name], MD [facility]  . IR GENERIC HISTORICAL  04/09/2016   IR RADIOLOGIST EVAL & MGMT 04/09/2016 [name], MD [facility] INTER[facility]  . IR GENERIC HISTORICAL  04/30/2016   IR IVC FILTER RETRIEVAL / S&I [name] GUID/MOD SED 04/30/2016 [name], MD [facility]-INTER[facility] RAD  . LITHOTRIPSY    . LUMBAR LAMINECTOMY/DECOMPRESSION MICRODISCECTOMY Right 09/14/2012   Procedure: Right Lumbar three-four, four-five, Lumbar five-Sacral one decompressive laminectomy;  Surgeon: [name], MD;  Location: [place] [facility];  Service: Neurosurgery;  Laterality: Right;  . LUMBAR LAMINECTOMY/DECOMPRESSION MICRODISCECTOMY Left 03/11/2016   Procedure: Left Lumbar Two-ThreeMicrodiscectomy;  Surgeon: [name], MD;  Location: [place];  Service: Neurosurgery;  Laterality: Left;  . PERIPHERAL VASCULAR INTERVENTION  11/18/2016   Procedure: PERIPHERAL VASCULAR INTERVENTION;  Surgeon: [name], MD;  Location: [place] [facility];  Service: Cardiovascular;;  Left popliteal  . SHOULDER SURGERY Left 05/07/2009  . TONSILLECTOMY       reports that he has never smoked. He has never used smokeless tobacco. He reports that he does not drink alcohol or use drugs.  Allergies  Allergen Reactions  . Phenergan [Promethazine Hcl] Swelling and Other (See Comments)    Cannot be combined with Zofran because swelling of the eyes resulted and patient became very restless-  . Zofran [Ondansetron Hcl] Swelling and Other (See Comments)    Cannot be combined with Phenergan because swelling of the eyes resulted and patient became very restless-  . Sulfamethoxazole Rash and Itching    Family History  Problem Relation Age of Onset  . Heart disease Mother   . Heart attack Mother   . Stroke Mother   . Heart disease Father     . Heart attack Father   . Stroke Father   . Breast cancer Sister        Twin   . Heart attack Sister   . Dementia Sister   . Heart disease Sister   . Heart attack Sister   . Clotting disorder Sister   . Heart attack Sister   . Colon cancer Neg Hx      Prior to Admission medications   Medication Sig Start Date End Date Taking? Authorizing Provider  brimonidine-timolol (COMBIGAN) 0.2-0.5 % ophthalmic solution Place 1 drop into both eyes every 12 (twelve) hours.   Yes [provider]  cholecalciferol (VITAMIN D) 1000 units tablet Take 1,000 Units by mouth daily.    Yes [provider]  ELIQUIS 5 MG TABS tablet TAKE 1 TABLET (5 MG TOTAL) BY MOUTH 2 (  TWO) TIMES DAILY. 12/09/15  Yes Kathrynn Ducking, MD  furosemide (LASIX) 20 MG tablet Take 1 tablet (20 mg total) by mouth daily. 09/27/17  Yes Renato Shin, MD  imatinib (GLEEVEC) 100 MG tablet Take 300 mg by mouth daily at 12 noon. Take with meals and large glass of water.Caution:Chemotherapy    Yes [provider]  isosorbide mononitrate (IMDUR) 30 MG 24 hr tablet Take 1 tablet (30 mg total) by mouth daily. 09/17/15  Yes Sherren Mocha, MD  lidocaine (LIDODERM) 5 % Place 1 patch onto the skin daily as needed (for back pain). Remove & Discard patch within 12 hours or as directed by MD    Yes [provider]  lisinopril (PRINIVIL,ZESTRIL) 10 MG tablet Take 1 tablet (10 mg total) by mouth daily. 09/27/17  Yes Renato Shin, MD  loperamide (IMODIUM) 2 MG capsule Take 2-4 mg by mouth 3 (three) times daily as needed for diarrhea or loose stools.    Yes [provider]  Multiple Vitamin (MULTIVITAMIN) tablet Take 1 tablet by mouth daily.   Yes [provider]  oxyCODONE (OXY IR/ROXICODONE) 5 MG immediate release tablet Take 1 tablet (5 mg total) by mouth every 6 (six) hours as needed for moderate pain or severe pain. 09/29/17  Yes Charlesetta Shanks, MD  predniSONE (DELTASONE) 1 MG tablet Take 3  tablets (3 mg total) by mouth daily with breakfast. 06/23/17  Yes Kathrynn Ducking, MD  pyridostigmine (MESTINON) 60 MG tablet Take 60 mg by mouth daily.   Yes [provider]  ranitidine (ZANTAC) 150 MG tablet Take 150 mg by mouth daily at 12 noon.    Yes [provider]  rosuvastatin (CRESTOR) 10 MG tablet Take 5 mg by mouth daily.   Yes [provider]  esomeprazole (NEXIUM) 40 MG capsule Take 1 capsule (40 mg total) by mouth daily. Patient not taking: Reported on 10/30/2017 09/29/17   Charlesetta Shanks, MD  orphenadrine (NORFLEX) 100 MG tablet Take 1 tablet (100 mg total) by mouth 2 (two) times daily. Patient not taking: Reported on 10/30/2017 09/29/17   Charlesetta Shanks, MD    Physical Exam: Vitals:   10/30/17 1900 10/30/17 1945 10/30/17 2045 10/30/17 2130  BP: 132/85 (!) 159/57 (!) 160/104 136/74  Pulse: 99 (!) 110 (!) 109 (!) 57  Resp: 16 (!) 26 20 18   Temp:      TempSrc:      SpO2: 98% 96% 98% 95%  Weight:      Height:          Constitutional: NAD, calm, comfortable Vitals:   10/30/17 1900 10/30/17 1945 10/30/17 2045 10/30/17 2130  BP: 132/85 (!) 159/57 (!) 160/104 136/74  Pulse: 99 (!) 110 (!) 109 (!) 57  Resp: 16 (!) 26 20 18   Temp:      TempSrc:      SpO2: 98% 96% 98% 95%  Weight:      Height:       Eyes: PERRL, lids and conjunctivae normal ENMT: Mucous membranes are moist. Posterior pharynx clear of any exudate or lesions.Normal dentition.  Neck: Decreased range of motion, no masses, no thyromegaly Respiratory: clear to auscultation bilaterally, no wheezing, no crackles. Normal respiratory effort. No accessory muscle use.  Cardiovascular: Regular rate and rhythm, no murmurs / rubs / gallops. No extremity edema. 2+ pedal pulses. No carotid bruits.  Abdomen: no tenderness, no masses palpated. No hepatosplenomegaly. Bowel sounds positive.  Musculoskeletal: no clubbing / cyanosis. No joint deformity upper and lower  extremities. Good ROM, no  contractures. Normal muscle tone.  Skin: no rashes, lesions, ulcers. No induration Neurologic: CN 2-12 grossly intact. Sensation intact, DTR normal. Strength 4/5 in LUE.  Psychiatric: Normal judgment and insight. Alert and oriented x 3. Normal mood.   Labs on Admission: I have personally reviewed following labs and imaging studies  CBC: Recent Labs  Lab 10/30/17 0838  WBC 13.8*  NEUTROABS 10.3*  HGB 11.6*  HCT 35.1*  MCV 94.9  PLT 269   Basic Metabolic Panel: Recent Labs  Lab 10/30/17 0838  NA 136  K 3.5  CL 101  CO2 25  GLUCOSE 120*  BUN 19  CREATININE 1.35*  CALCIUM 8.5*   GFR: Estimated Creatinine Clearance: 44.8 mL/min (A) (by C-G formula based on SCr of 1.35 mg/dL (H)). Liver Function Tests: No results for input(s): AST, ALT, ALKPHOS, BILITOT, PROT, ALBUMIN in the last 168 hours. No results for input(s): LIPASE, AMYLASE in the last 168 hours. No results for input(s): AMMONIA in the last 168 hours. Coagulation Profile: No results for input(s): INR, PROTIME in the last 168 hours. Cardiac Enzymes: No results for input(s): CKTOTAL, CKMB, CKMBINDEX, TROPONINI in the last 168 hours. BNP (last 3 results) Recent Labs    10/06/17 1139  PROBNP 73.0   HbA1C: No results for input(s): HGBA1C in the last 72 hours. CBG: No results for input(s): GLUCAP in the last 168 hours. Lipid Profile: No results for input(s): CHOL, HDL, LDLCALC, TRIG, CHOLHDL, LDLDIRECT in the last 72 hours. Thyroid Function Tests: No results for input(s): TSH, T4TOTAL, FREET4, T3FREE, THYROIDAB in the last 72 hours. Anemia Panel: No results for input(s): VITAMINB12, FOLATE, FERRITIN, TIBC, IRON, RETICCTPCT in the last 72 hours. Urine analysis:    Component Value Date/Time   COLORURINE YELLOW 10/30/2017 2024   APPEARANCEUR CLEAR 10/30/2017 2024   LABSPEC 1.021 10/30/2017 2024   PHURINE 6.0 10/30/2017 2024   GLUCOSEU NEGATIVE 10/30/2017 2024   GLUCOSEU NEGATIVE 04/09/2014 0910   HGBUR  NEGATIVE 10/30/2017 2024   BILIRUBINUR NEGATIVE 10/30/2017 2024   KETONESUR NEGATIVE 10/30/2017 2024   PROTEINUR NEGATIVE 10/30/2017 2024   UROBILINOGEN 1.0 04/09/2014 0910   NITRITE NEGATIVE 10/30/2017 2024   LEUKOCYTESUR NEGATIVE 10/30/2017 2024   Sepsis Labs: @LABRCNTIP (procalcitonin:4,lacticidven:4) )No results found for this or any previous visit (from the past 240 hour(s)).   Radiological Exams on Admission: Ct Cervical Spine Wo Contrast  Result Date: 10/30/2017 CLINICAL DATA:  Left-sided neck pain. Painful range of motion. Pain extends into the left hip and leg. EXAM: CT CERVICAL SPINE WITHOUT CONTRAST TECHNIQUE: Multidetector CT imaging of the cervical spine was performed without intravenous contrast. Multiplanar CT image reconstructions were also generated. COMPARISON:  CT angiogram of the neck dated 07/19/2017 FINDINGS: Alignment: Chronic 2 mm spondylolisthesis at C7-T1. Skull base and vertebrae: No acute fracture. No primary bone lesion or focal pathologic process. Soft tissues and spinal canal: No prevertebral fluid or swelling. No visible canal hematoma. Disc levels: C1-2: Chronic hypertrophy of the soft tissues adjacent to the transverse ligament posterior to the odontoid process, asymmetrically prominent to the right with slight compression of the thecal sac to the right of midline best demonstrated on image 27 of series 4. The appearance is essentially unchanged. C2-3: Disc osteophyte complex extends into the left lateral recess and left neural foramen without severe foraminal stenosis. Moderate left facet arthritis. C3-4: Disc space narrowing. Broad-based disc osteophyte complex asymmetric to the left with narrowing of both lateral recesses severe bilateral foraminal stenosis, unchanged. Narrowing of  the AP dimension of the spinal canal to approximately 7 mm, unchanged. Moderate left facet arthritis. C4-5: Disc space narrowing. Broad-based disc osteophyte complex with lobulated disc  extrusion extending inferiorly behind the body of C5, essentially unchanged since the prior study. The extrusion compresses the right lateral recess and could affect the right C6 nerve. Severe chronic bilateral foraminal stenosis. Moderate bilateral facet arthritis. C5-6: Minimal broad-based disc osteophyte complex without neural impingement. Widely patent neural foramina. Minimal left facet arthritis. C6-7: Previous bilateral laminotomies. Osteophytes fuse the C6-7 vertebral bodies. Calcification of posterior longitudinal ligament narrows the AP dimension of the spinal canal. Widely patent neural foramina. C7-T1: Moderate bilateral facet arthritis. 2 mm spondylolisthesis with disc space narrowing. Broad-based disc osteophyte complex without neural impingement. C7-T1: Disc space narrowing. Moderate right facet arthritis. Moderate right foraminal stenosis. Upper chest: Negative. Other: None IMPRESSION: 1. No acute abnormality of the cervical spine. No appreciable change since the prior CT angiogram of the neck dated 07/19/2017. 2. 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 3. Chronic soft disc extrusion at C4-5 asymmetric to the right as described. Electronically Signed   By: Lorriane Shire M.D.   On: 10/30/2017 08:05   Mr Cervical Spine W Or Wo Contrast  Result Date: 10/30/2017 CLINICAL DATA:  Neck pain. Infection suspected. Diabetes. Chronic kidney disease. EXAM: MRI CERVICAL SPINE WITHOUT AND WITH CONTRAST TECHNIQUE: Multiplanar and multiecho pulse sequences of the cervical spine, to include the craniocervical junction and cervicothoracic junction, were obtained without and with intravenous contrast. CONTRAST:  Gadavist, 7 mL. COMPARISON:  CT cervical spine 10/30/2017.  CT head 07/19/2017. FINDINGS: Alignment: Anatomic except for 3 mm anterolisthesis C7-T1. Vertebrae: No worrisome osseous lesion. No evidence of diskitis, or focal  bone lesion. Cord: Multilevel cord compression, described below. No abnormal cord signal. Posterior Fossa, vertebral arteries, paraspinal tissues: No tonsillar herniation. Chronic LEFT cerebellar infarct. No paraspinous effusion or signs of infection. Marked pannus extends behind the odontoid greater on the RIGHT as far cephalad as the foramen magnum. There is no significant cervicomedullary compression. Disc levels: C2-3: Annular bulge. Asymmetric facet arthropathy to the LEFT. No stenosis but LEFT C3 foraminal narrowing is noted. C3-4: Disc space narrowing. Central disc extrusion, with moderate cord flattening canal diameter 5-6 mm. Facet arthropathy. BILATERAL C4 foraminal narrowing, greater on the LEFT. C4-5: Disc space narrowing. Central disc extrusion, caudally migrated free fragments central and to the RIGHT. Severe cord compression, canal diameter 4-5 mm. BILATERAL C5 foraminal narrowing is compounded by facet arthropathy, worse on the RIGHT. C5-6: Disc desiccation. Shallow protrusion. Uncinate spurring and facet arthropathy in LEFT result in C6 foraminal narrowing. C6-7: Disc space narrowing. Central protrusion. Slight cord flattening. No definite C7 foraminal narrowing. C7-T1: 3 mm anterolisthesis. Central disc extrusion with osseous spurring. Severe facet arthropathy. Mild stenosis without cord flattening. LEFT C8 foraminal narrowing. Post infusion no concerning abnormal enhancement. IMPRESSION: Multilevel spondylosis, with areas of significant spinal stenosis at C3-4 and C4-5 due to disc herniations. Cord compression with canal diameter as narrow as 4 mm. No signs of spinal infection, either diskitis or septic facet joint arthritis. Marked pannus, but no cervicomedullary compression. Electronically Signed   By: Staci Righter M.D.   On: 10/30/2017 17:49    EKG: Indep  endently reviewed.  Normal sinus rhythm with a rate of 84.  Evidence of old infarct with no specific ST  changes  Assessment/Plan Principal Problem:   Cervical spinal cord compression (HCC)  Active Problems:   Chronic myeloid leukemia in remission (HCC)   HYPERLIPIDEMIA, MIXED   Essential hypertension   CAD (coronary artery disease)   Diabetes mellitus type 2, controlled ([place])   History of pulmonary embolism   Long term (current) use of anticoagulants   GERD with stricture   Myasthenia gravis ([place])     #1 cervical spinal cord compression: Patient has been seen by neurosurgery Dr. Vertell Limber.  We will admit him for pain management, hold his Eliquis and start heparin for anticoagulation in preparation for possible surgery.  IV steroids and supportive care.  Neck collar will be placed.  #2 diabetes: Blood sugar appears well controlled.  Sliding scale insulin will be added to home regimen.  #3 essential hypertension: Blood pressure is currently controlled.  Continue home regimen.  #4 hyperlipidemia: Continue with statin.  #5 history of pulmonary embolism: Patient currently on Eliquis.  We will hold that and start heparin drip.  This can be turned off when patient is ready for surgery  #6 GERD: Continue with PPI.  #7 myasthenia gravis: Continue with semester and other home medications.  #8 history of coronary artery disease: Patient is very much stable with no chest pain and no evidence of coronary artery decompensation   DVT prophylaxis: Heparin drip Code Status: Full code Family Communication: Wife at bedside Disposition Plan: To be determined Consults called: Neurosurgery, Dr. Vertell Limber Admission status: Inpatient  Severity of Illness: The appropriate patient status for this patient is INPATIENT. Inpatient status is judged to be reasonable and necessary in order to provide the required intensity of service to ensure the patient's safety. The patient's presenting symptoms, physical exam findings, and initial radiographic and laboratory data in the context of their chronic comorbidities is  felt to place them at high risk for further clinical deterioration. Furthermore, it is not anticipated that the patient will be medically stable for discharge from the hospital within 2 midnights of admission. The following factors support the patient status of inpatient.   " The patient's presenting symptoms include neck pain and shoulder pain. " The worrisome physical exam findings include decreased range of motion of his neck with significant radicular pain. " The initial radiographic and laboratory data are worrisome because of MRI showing significant cervical spine cord compression. " The chronic co-morbidities include diabetes, hypertension and chronic anticoagulation.   * I certify that at the point of admission it is my clinical judgment that the patient will require inpatient hospital care spanning beyond 2 midnights from the point of admission due to high intensity of service, high risk for further deterioration and high frequency of surveillance required.Barbette Merino MD [facility] Pager [phone]  If 7PM-7AM, please contact night-coverage [email or web address] Password [facility]  10/30/2017, 10:12 PM

## 2017-10-30 NOTE — ED Provider Notes (Signed)
The patient taken in sign out from [place].  Patient here with complaint of radicular pain on the left side of his shoulder and deltoid region.  This is acute in onset.  He was here for almost 15 hours in the ER.  MRI showed significant cord compression at C5.  I consulted with Dr. Dierdre Harness who saw the patient here in the emergency department.  Patient will need steroids and surgery however his management is complicated by the fact that he has diabetes and is on Eliquis for recent CVA.  Consulted with Dr. 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  Patient also complaining of burning with urination was earlier having elevated temperature to 100.1.  I have ordered a UA which is pending.  I discussed the case with Dr. Jonelle Sidle who will admit the patient to try the hospitalist service.  He is stable throughout his ED course.   Margarita Mail, PA-C 10/31/17 0000    Charlesetta Shanks, MD 10/31/17 [phone]

## 2017-10-30 NOTE — ED Provider Notes (Addendum)
[facility] EMERGENCY DEPARTMENT Provider Note   CSN: [id number] Arrival date & time: 10/30/17  0350     History   Chief Complaint Chief Complaint  Patient presents with  . Neck Pain    HPI [name] is a 77 y.o. male.  The history is provided by the patient and the spouse. No language interpreter was used.  Neck Pain       77 year old male with history of CAD, diabetes, spinal stenosis, leukemia in remission brought here via EMS for evaluation of neck pain.  Patient report around 10 PM last night while he was sitting he developed acute onset of sharp shooting pain primarily to the left side of his neck and shoulder.  Pain is intense, 10 out of 10, worsening with movement, shooting down towards his shoulder.  Pain is very uncomfortable, he is unable to sleep.  He denies any specific treatment tried.  He denies any significant headache, arm weakness, pain in his chest, trouble breathing, or back pain.  He report having a "bad back" which required surgeries in the past.  His neurosurgeon is Dr. Sherley Bounds.  He also report receiving a joint injection his left hip earlier this week.  Patient denies any fever night sweats or weight loss.  He denies any recent strenuous activity or anything that precipitates his neck pain.  Past Medical History:  Diagnosis Date  . Bruises easily    d/t being on Eliquis  . Carotid artery disease ([place])    a. Duplex 05/2014: 84-16% RICA, 6-06% LICA, elevated velocities in right subclavian artery, normal left subclavian artery..  . Chronic back pain    HNP  . Chronic kidney disease (CKD)   . Claustrophobia    takes Ativan as needed  . 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 Coronary atherosclerosis of unspecified type of vessel, native or graft    a. cath in 2003 showed 10-20% LM, scattered 20% prox-mid LAD, 30% more distal LAD, 50-60% stenosis of LAD towards apex, 20% Cx, 30% dRCA, focal 95% stenosis in  smaller of 2 branches of PDA, EF 60-65%.  . Diabetes mellitus ([place])   . Diarrhea    takes Imodium daily as needed  . Diverticulosis   . Diverticulosis of colon (without mention of hemorrhage)   . DJD (degenerative joint disease)   . DVT (deep venous thrombosis) ([place]) 07/2011  . Esophageal stricture   . Essential hypertension    takes Imdur and Lisinopril daily  . Gastric polyps    benign  . GERD (gastroesophageal reflux disease)    takes Nexium daily  . Glaucoma    uses eye drops  . History of bronchitis    not sure when the last time  . History of colon polyps    benign  . History of kidney stones   . History of kidney stones   . Insomnia    takes Melatonin nightly as needed  . Mixed hyperlipidemia    takes Crestor daily  . Myasthenia gravis ([place])    uses prednisone  . Osteoporosis 2016  . Peripheral edema    takes Lasix daily  . Pituitary tumor    checks Dr[facility]Walden at [facility] checks it every Jan   . Pneumonia 01/2016  . PONV (postoperative nausea and vomiting)   . Ptosis   . Pulmonary embolus ([place]) 2012  . Stroke [facility]) 06/2015    Patient Active Problem List   Diagnosis Date Noted  .  Nasal abscess 09/25/2017  . Immunocompromised ([place]) 09/25/2017  . Cellulitis of right axilla 09/25/2017  . Anemia 08/05/2017  . Transient ischemic attack (TIA) 07/23/2017  . Subcortical infarction ([place])   . Right hemiparesis ([place])   . Ataxia 07/21/2017  . TIA (transient ischemic attack)   . Thrombocytopenia ([place])   . Wheezing 06/17/2017  . Bursitis of left elbow 05/01/2017  . Dystonia 12/29/2016  . PAD (peripheral artery disease) ([place]) 11/18/2016  . Osteoporosis 08/28/2016  . Paronychia of great toe, left 08/10/2016  . Carotid arterial disease ([place]) 07/09/2016  . Hypocalcemia 07/09/2016  . Diplopia 07/02/2016  . S/P lumbar laminectomy 03/11/2016  . Radicular pain of left lower extremity 02/21/2016  . Cough 01/10/2016  . 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 Myasthenia gravis ([place])   . Diabetes mellitus type 2 in nonobese (HCC)   . Coronary artery disease involving native coronary artery of native heart without angina pectoris   . Ataxia, post-stroke   . Gait disturbance, post-stroke   . Proximal leg weakness   . Cervical spondylosis without myelopathy   . Achilles tendinitis of right lower extremity   . Chronic diastolic CHF (congestive heart failure) ([place])   . Gastroesophageal reflux disease without esophagitis   . Chronic kidney disease   . 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 Cerebellar stroke ([place])   . Ischemic stroke ([place])   . Cerebrovascular accident (CVA) due to thrombosis of left vertebral artery ([place])   . History of pulmonary embolus (PE)   . Chronic anticoagulation   . HLD (hyperlipidemia)   . Neck pain 03/19/2015  . Pain in the chest   . 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 Chronic diastolic heart failure, NYHA class 1 ([place]) 03/07/2015  . Diabetic gastroparesis ([place]) 03/07/2015  . Screening for osteoporosis 07/19/2014  . Dyspnea 04/09/2014  . Edema 04/09/2014  . Routine general medical examination at a health care facility 10/23/2013  . Bilateral carotid bruits 05/24/2013  . Abnormal CT scan, lung 05/24/2013  . Encounter for therapeutic drug monitoring 03/14/2013  . Lymphadenopathy, inguinal 01/11/2013  . AKI (acute kidney injury) ([place]) 10/28/2012  . 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 Myasthenia gravis with exacerbation ([place]) 03/31/2012  . Disease of salivary gland 12/09/2011  . Left facial pain 10/28/2011  . Pulmonary embolism ([place]) 10/25/2011  . 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 GERD with stricture 09/23/2011  . Long term (current) use of anticoagulants 08/03/2011  . History of pulmonary embolism 07/30/2011  . History of DVT (deep vein thrombosis) 07/30/2011  . Subconjunctival hemorrhage 06/22/2011  . Bilateral  conjunctivitis 04/09/2011  . Diabetes mellitus type 2, controlled ([place]) 12/18/2010  . Hearing loss 08/04/2010  . Ptosis of eyelid 12/30/2009  . Essential hypertension 04/24/2009  . HYPERLIPIDEMIA, MIXED 10/05/2008  . HEMORRHOIDS-INTERNAL 07/02/2008  . HEMORRHOIDS-EXTERNAL 07/02/2008  . DIVERTICULOSIS-COLON 07/02/2008  . PERSONAL HX COLONIC POLYPS 07/02/2008  . RASH-NONVESICULAR 02/07/2008  . PNEUMONIA 12/08/2007  . Chronic myeloid leukemia in remission ([place]) 07/07/2007  . CAD (coronary artery disease) 07/07/2007  . NEPHROLITHIASIS, HX OF 07/07/2007    Past Surgical History:  Procedure Laterality Date  . ABDOMINAL AORTOGRAM W/LOWER EXTREMITY N/A 11/18/2016   Procedure: ABDOMINAL AORTOGRAM W/LOWER EXTREMITY;  Surgeon: [name], MD;  Location: [place] CV LAB;  Service: Cardiovascular;  Laterality: N/A;  . BACK SURGERY     x2, lumbar and cervical  . CARDIAC CATHETERIZATION    .  CATARACT EXTRACTION Bilateral 12/12  . COLONOSCOPY    . ESOPHAGOGASTRODUODENOSCOPY ENDOSCOPY  04/2015  . IR GENERIC HISTORICAL  03/03/2016   IR IVC FILTER PLMT / S&I [name] GUID/MOD SED 03/03/2016 [name], MD [facility]INTER[facility] RAD  . IR GENERIC HISTORICAL  04/09/2016   IR RADIOLOGIST EVAL & MGMT 04/09/2016 [name], MD [facility] INTER[facility] RAD  . IR GENERIC HISTORICAL  04/30/2016   IR IVC FILTER RETRIEVAL / S&I [name] GUID/MOD SED 04/30/2016 [name], MD [facility]-INTERV RAD  . LITHOTRIPSY    . LUMBAR LAMINECTOMY/DECOMPRESSION MICRODISCECTOMY Right 09/14/2012   Procedure: Right Lumbar three-four, four-five, Lumbar five-Sacral one decompressive laminectomy;  Surgeon: [name], MD;  Location: [place] NEURO ORS;  Service: Neurosurgery;  Laterality: Right;  . LUMBAR LAMINECTOMY/DECOMPRESSION MICRODISCECTOMY Left 03/11/2016   Procedure: Left Lumbar Two-ThreeMicrodiscectomy;  Surgeon: [name], MD;  Location: [facility];  Service: Neurosurgery;  Laterality: Left;  . PERIPHERAL VASCULAR INTERVENTION  11/18/2016   Procedure:  PERIPHERAL VASCULAR INTERVENTION;  Surgeon: [name], MD;  Location: [place] CV LAB;  Service: Cardiovascular;;  Left popliteal  . SHOULDER SURGERY Left 05/07/2009  . TONSILLECTOMY          Home Medications    Prior to Admission medications   Medication Sig Start Date End Date Taking? Authorizing Provider  brimonidine-timolol (COMBIGAN) 0.2-0.5 % ophthalmic solution Place 1 drop into both eyes every 12 (twelve) hours.   Yes [provider]  cholecalciferol (VITAMIN D) 1000 units tablet Take 1,000 Units by mouth daily.    Yes [provider]  ELIQUIS 5 MG TABS tablet TAKE 1 TABLET (5 MG TOTAL) BY MOUTH 2 (TWO) TIMES DAILY. 12/09/15  Yes Kathrynn Ducking, MD  furosemide (LASIX) 20 MG tablet Take 1 tablet (20 mg total) by mouth daily. 09/27/17  Yes Renato Shin, MD  imatinib (GLEEVEC) 100 MG tablet Take 300 mg by mouth daily at 12 noon. Take with meals and large glass of water.Caution:Chemotherapy    Yes [provider]  isosorbide mononitrate (IMDUR) 30 MG 24 hr tablet Take 1 tablet (30 mg total) by mouth daily. 09/17/15  Yes Sherren Mocha, MD  lidocaine (LIDODERM) 5 % Place 1 patch onto the skin daily as needed (for back pain). Remove & Discard patch within 12 hours or as directed by MD    Yes [provider]  lisinopril (PRINIVIL,ZESTRIL) 10 MG tablet Take 1 tablet (10 mg total) by mouth daily. 09/27/17  Yes Renato Shin, MD  loperamide (IMODIUM) 2 MG capsule Take 2-4 mg by mouth 3 (three) times daily as needed for diarrhea or loose stools.    Yes [provider]  Multiple Vitamin (MULTIVITAMIN) tablet Take 1 tablet by mouth daily.   Yes [provider]  oxyCODONE (OXY IR/ROXICODONE) 5 MG immediate release tablet Take 1 tablet (5 mg total) by mouth every 6 (six) hours as needed for moderate pain or severe pain. 09/29/17  Yes Charlesetta Shanks, MD  predniSONE (DELTASONE) 1 MG tablet Take 3 tablets (3 mg total) by mouth daily with  breakfast. 06/23/17  Yes Kathrynn Ducking, MD  pyridostigmine (MESTINON) 60 MG tablet Take 60 mg by mouth daily.   Yes [provider]  ranitidine (ZANTAC) 150 MG tablet Take 150 mg by mouth daily at 12 noon.    Yes [provider]  rosuvastatin (CRESTOR) 10 MG tablet Take 5 mg by mouth daily.   Yes [provider]  esomeprazole (NEXIUM) 40 MG capsule Take 1 capsule (40 mg total)  by mouth daily. Patient not taking: Reported on 10/30/2017 09/29/17   Charlesetta Shanks, MD  orphenadrine (NORFLEX) 100 MG tablet Take 1 tablet (100 mg total) by mouth 2 (two) times daily. Patient not taking: Reported on 10/30/2017 09/29/17   Charlesetta Shanks, MD    Family History Family History  Problem Relation Age of Onset  . Heart disease Mother   . Heart attack Mother   . Stroke Mother   . Heart disease Father   . Heart attack Father   . Stroke Father   . Breast cancer Sister        Twin   . Heart attack Sister   . Dementia Sister   . Heart disease Sister   . Heart attack Sister   . Clotting disorder Sister   . Heart attack Sister   . Colon cancer Neg Hx     Social History Social History   Tobacco Use  . Smoking status: Never Smoker  . Smokeless tobacco: Never Used  Substance Use Topics  . Alcohol use: No    Alcohol/week: 0.0 standard drinks  . Drug use: No     Allergies   Phenergan [promethazine hcl]; Zofran [ondansetron hcl]; and Sulfamethoxazole   Review of Systems Review of Systems  Musculoskeletal: Positive for neck pain.  All other systems reviewed and are negative.    Physical Exam Updated Vital Signs BP (!) 135/91 (BP Location: Right Arm)   Pulse (!) 123   Temp 99 F (37.2 C) (Oral)   Resp 20   SpO2 99%   Physical Exam  Constitutional: He is oriented to person, place, and time. He appears well-developed and well-nourished. No distress.  Elderly male appears uncomfortable but nontoxic.  HENT:  Head: Atraumatic.  Eyes: Conjunctivae are normal.   Neck: Neck supple.  No significant midline spine tenderness however he does have tenderness along the left paracervical spinal muscle as well as the left trapezius with gentle palpation.  No overlying skin changes, no carotid bruit, no edema.  Decreased lateral neck rotation secondary to pain.  Cardiovascular:  Tachycardia without murmurs rubs or gallops  Pulmonary/Chest: Effort normal and breath sounds normal.  Musculoskeletal:  Equal grip strength bilaterally, intact radial pulse bilaterally.  Neurological: He is alert and oriented to person, place, and time.  Skin: No rash noted.  Psychiatric: He has a normal mood and affect.  Nursing note and vitals reviewed.    ED Treatments / Results  Labs (all labs ordered are listed, but only abnormal results are displayed) Labs Reviewed  CBC WITH DIFFERENTIAL/PLATELET - Abnormal; Notable for the following components:      Result Value   WBC 13.8 (*)    RBC 3.70 (*)    Hemoglobin 11.6 (*)    HCT 35.1 (*)    Neutro Abs 10.3 (*)    Monocytes Absolute 1.7 (*)    Abs Immature Granulocytes 0.2 (*)    All other components within normal limits  BASIC METABOLIC PANEL - Abnormal; Notable for the following components:   Glucose, Bld 120 (*)    Creatinine, Ser 1.35 (*)    Calcium 8.5 (*)    GFR calc non Af Amer 48 (*)    GFR calc Af Amer 56 (*)    All other components within normal limits  SEDIMENTATION RATE - Abnormal; Notable for the following components:   Sed Rate 28 (*)    All other components within normal limits  C-REACTIVE PROTEIN - Abnormal; Notable for the following components:  CRP 3.2 (*)    All other components within normal limits  CULTURE, BLOOD (ROUTINE X 2)  CULTURE, BLOOD (ROUTINE X 2)  I-STAT CG4 LACTIC ACID, ED    EKG None  ED ECG REPORT   Date: 10/30/2017  Rate: 97  Rhythm: normal sinus rhythm  QRS Axis: left  Intervals: normal  ST/T Wave abnormalities: nonspecific ST changes  Conduction Disutrbances:none   Narrative Interpretation:   Old EKG Reviewed: unchanged  I have personally reviewed the EKG tracing and agree with the computerized printout as noted.   Radiology Ct Cervical Spine Wo Contrast  Result Date: 10/30/2017 CLINICAL DATA:  Left-sided neck pain. Painful range of motion. Pain extends into the left hip and leg. EXAM: CT CERVICAL SPINE WITHOUT CONTRAST TECHNIQUE: Multidetector CT imaging of the cervical spine was performed without intravenous contrast. Multiplanar CT image reconstructions were also generated. COMPARISON:  CT angiogram of the neck dated 07/19/2017 FINDINGS: Alignment: Chronic 2 mm spondylolisthesis at C7-T1. Skull base and vertebrae: No acute fracture. No primary bone lesion or focal pathologic process. Soft tissues and spinal canal: No prevertebral fluid or swelling. No visible canal hematoma. Disc levels: C1-2: Chronic hypertrophy of the soft tissues adjacent to the transverse ligament posterior to the odontoid process, asymmetrically prominent to the right with slight compression of the thecal sac to the right of midline best demonstrated on image 27 of series 4. The appearance is essentially unchanged. C2-3: Disc osteophyte complex extends into the left lateral recess and left neural foramen without severe foraminal stenosis. Moderate left facet arthritis. C3-4: Disc space narrowing. Broad-based disc osteophyte complex asymmetric to the left with narrowing of both lateral recesses severe bilateral foraminal stenosis, unchanged. Narrowing of the AP dimension of the spinal canal to approximately 7 mm, unchanged. Moderate left facet arthritis. C4-5: Disc space narrowing. Broad-based disc osteophyte complex with lobulated disc extrusion extending inferiorly behind the body of C5, essentially unchanged since the prior study. The extrusion compresses the right lateral recess and could affect the right C6 nerve. Severe chronic bilateral foraminal stenosis. Moderate bilateral facet  arthritis. C5-6: Minimal broad-based disc osteophyte complex without neural impingement. Widely patent neural foramina. Minimal left facet arthritis. C6-7: Previous bilateral laminotomies. Osteophytes fuse the C6-7 vertebral bodies. Calcification of posterior longitudinal ligament narrows the AP dimension of the spinal canal. Widely patent neural foramina. C7-T1: Moderate bilateral facet arthritis. 2 mm spondylolisthesis with disc space narrowing. Broad-based disc osteophyte complex without neural impingement. C7-T1: Disc space narrowing. Moderate right facet arthritis. Moderate right foraminal stenosis. Upper chest: Negative. Other: None IMPRESSION: 1. No acute abnormality of the cervical spine. No appreciable change since the prior CT angiogram of the neck dated 07/19/2017. 2. 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 3. Chronic soft disc extrusion at C4-5 asymmetric to the right as described. Electronically Signed   By: Lorriane Shire M.D.   On: 10/30/2017 08:05    Procedures .Critical Care Performed by: Domenic Moras, PA-C Authorized by: Domenic Moras, PA-C   Critical care provider statement:    Critical care time (minutes):  45   Critical care was time spent personally by me on the following activities:  Discussions with consultants, evaluation of patient's response to treatment, examination of patient, ordering and performing treatments and interventions, ordering and review of laboratory studies, ordering and review of radiographic studies, pulse oximetry, re-evaluation of patient's condition, obtaining history from patient or surrogate and review of old charts   (  including critical care time)  Medications Ordered in ED Medications  LORazepam (ATIVAN) injection 1 mg (has no administration in time range)  ceFEPIme (MAXIPIME) 2 g in sodium chloride 0.9 % 100 mL IVPB (has no administration in time range)    HYDROmorphone (DILAUDID) injection 1 mg (1 mg Intravenous Given 10/30/17 0729)  sodium chloride 0.9 % bolus 1,000 mL (0 mLs Intravenous Stopped 10/30/17 1037)  HYDROmorphone (DILAUDID) injection 1 mg (1 mg Intravenous Given 10/30/17 1049)  cyclobenzaprine (FLEXERIL) tablet 5 mg (5 mg Oral Given 10/30/17 1049)  sodium chloride 0.9 % bolus 1,000 mL (1,000 mLs Intravenous New Bag/Given 10/30/17 1111)  vancomycin (VANCOCIN) IVPB 1000 mg/200 mL premix (1,000 mg Intravenous New Bag/Given 10/30/17 1159)  HYDROmorphone (DILAUDID) injection 1 mg (1 mg Intravenous Given 10/30/17 1158)     Initial Impression / Assessment and Plan / ED Course  I have reviewed the triage vital signs and the nursing notes.  Pertinent labs & imaging results that were available during my care of the patient were reviewed by me and considered in my medical decision making (see chart for details).     BP (!) 157/64   Pulse 91   Temp (S) 100.1 F (37.8 C) (Rectal)   Resp 17   Ht 5\' 7"  (1.702 m)   Wt 79.4 kg   SpO2 97%   BMI 27.41 kg/m    Final Clinical Impressions(s) / ED Diagnoses   Final diagnoses:  None    ED Discharge Orders    None     6:53 AM Patient here with reproducible left-sided neck and shoulder pain.  Likely musculoskeletal as it is worsened with movement.  However he does have history of cancer in the past therefore I will obtain cervical spine CT.  Care discussed with Dr. Ellender Hose.  11:00 AM Patient with moderate pain requiring multiple dose of pain medication.  He had a low-grade temperature of 100.3 as well as tachycardia initially.  After pain medication tachycardia did improved.  His initial lactic acid is within normal limit, he does have elevated white count of 13.8 however he did report receiving steroid injection to his left hip fairly recently which can contribute to the leukocytosis.  He does have elevated sed rate of 28, and elevated CRP of 3.2.  Initial CT of the cervical spine shows no  acute abnormalities.  There are diffuse degenerative disc joint disease in the cervical spine with severe bilateral foraminal stenosis.  This could potentially account for his radicular pain however due to the potential infectious etiology, I have talked to our on-call radiologist, Dr. Nevada Crane, who recommend MRI of the cervical spine with and without contrast.  He does have remote history of CML currently in remission.  11:38 AM Rectal temperature is 100.5.  We will initiate broad-spectrum antibiotic which includes vancomycin and cefepime.  Patient is currently awaits MRI of his cervical spine.  We will continue with pain management.  1:03 PM Normal lactic acid, blood cultures pending, mild leukocytosis with WBC 13.8, elevated sed rate of 28 and CRP of 3.2.  Pt awaits cervical spine MRI.  He is resting comfortably.  4:00 PM Pt sign out to oncoming provider who will f/u on MRI result.  If negative, please treat for radiculopathy and outpt f/u with neurosurgeon Dr. Ronnald Ramp.    Domenic Moras, PA-C 10/30/17 1601    Palumbo, April, MD 10/30/17 2311  ADDENDUM: CC time added.    Domenic Moras, PA-C 11/08/17 1019    Duffy Bruce, MD  11/18/17 1540  

## 2017-10-30 NOTE — Progress Notes (Signed)
ANTICOAGULATION CONSULT NOTE - Initial Consult  Pharmacy Consult for heparin Indication: pulmonary embolus  Allergies  Allergen Reactions  . Phenergan [Promethazine Hcl] Swelling and Other (See Comments)    Cannot be combined with Zofran because swelling of the eyes resulted and patient became very restless-  . Zofran [Ondansetron Hcl] Swelling and Other (See Comments)    Cannot be combined with Phenergan because swelling of the eyes resulted and patient became very restless-  . Sulfamethoxazole Rash and Itching    Patient Measurements: Height: 5\' 7"  (170.2 cm) Weight: 175 lb (79.4 kg) IBW/kg (Calculated) : 66.1 Heparin Dosing Weight: 79.4  Vital Signs: Temp: 100.1 F (37.8 C) (09/21 1134) Temp Source: Rectal (09/21 1134) BP: 136/74 (09/21 2130) Pulse Rate: 57 (09/21 2130)  Labs: Recent Labs    10/30/17 0838  HGB 11.6*  HCT 35.1*  PLT 186  CREATININE 1.35*    Estimated Creatinine Clearance: 44.8 mL/min (A) (by C-G formula based on SCr of 1.35 mg/dL (H)).   Medical History: Past Medical History:  Diagnosis Date  . Bruises easily    d/t being on Eliquis  . Carotid artery disease ([place])    a. Duplex 05/2014: 09-73% RICA, 5-32% LICA, elevated velocities in right subclavian artery, normal left subclavian artery..  . Chronic back pain    HNP  . Chronic kidney disease (CKD)   . Claustrophobia    takes Ativan as needed  . 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 Coronary atherosclerosis of unspecified type of vessel, native or graft    a. cath in 2003 showed 10-20% LM, scattered 20% prox-mid LAD, 30% more distal LAD, 50-60% stenosis of LAD towards apex, 20% Cx, 30% dRCA, focal 95% stenosis in smaller of 2 branches of PDA, EF 60-65%.  . Diabetes mellitus ([place])   . Diarrhea    takes Imodium daily as needed  . Diverticulosis   . Diverticulosis of colon (without mention of hemorrhage)   . DJD (degenerative joint disease)   . DVT (deep venous  thrombosis) ([place]) 07/2011  . Esophageal stricture   . Essential hypertension    takes Imdur and Lisinopril daily  . Gastric polyps    benign  . GERD (gastroesophageal reflux disease)    takes Nexium daily  . Glaucoma    uses eye drops  . History of bronchitis    not sure when the last time  . History of colon polyps    benign  . History of kidney stones   . History of kidney stones   . Insomnia    takes Melatonin nightly as needed  . Mixed hyperlipidemia    takes Crestor daily  . Myasthenia gravis ([place])    uses prednisone  . Osteoporosis 2016  . Peripheral edema    takes Lasix daily  . Pituitary tumor    checks Dr[facility]Walden at [facility] checks it every Jan   . Pneumonia 01/2016  . PONV (postoperative nausea and vomiting)   . Ptosis   . Pulmonary embolus ([place]) 2012  . Stroke [facility]) 06/2015    Medications:  eliquis PTA, last dose 9/20 at 2130  Assessment: 77 yo man to start heparin for h/o DVT,PE. He will need surgery for cervical stenosis. Will need to monitor aPTT until effects of eliquis wear off Goal of Therapy:  APTT 66-102 sec Heparin level 0.3-0.7 units/ml Monitor platelets by anticoagulation protocol: Yes   Plan:  Heparin 1500 unit bolus and drip at 1300 units/hr Check aPTT  and heparin level 6-8 hours after start Daily aPTT, HL and CBC while on heparin Monitor for bleeding complications  Veleta Yamamoto [place] 10/30/2017,10:26 PM

## 2017-10-31 ENCOUNTER — Encounter (HOSPITAL_COMMUNITY): Payer: Self-pay | Admitting: *Deleted

## 2017-10-31 DIAGNOSIS — M4712 Other spondylosis with myelopathy, cervical region: Secondary | ICD-10-CM

## 2017-10-31 DIAGNOSIS — E1159 Type 2 diabetes mellitus with other circulatory complications: Secondary | ICD-10-CM

## 2017-10-31 LAB — COMPREHENSIVE METABOLIC PANEL
ALK PHOS: 55 U/L (ref 38–126)
ALT: 37 U/L (ref 0–44)
AST: 25 U/L (ref 15–41)
Albumin: 2.6 g/dL — ABNORMAL LOW (ref 3.5–5.0)
Anion gap: 14 (ref 5–15)
BUN: 15 mg/dL (ref 8–23)
CALCIUM: 8.1 mg/dL — AB (ref 8.9–10.3)
CO2: 19 mmol/L — AB (ref 22–32)
CREATININE: 1.21 mg/dL (ref 0.61–1.24)
Chloride: 104 mmol/L (ref 98–111)
GFR calc non Af Amer: 55 mL/min — ABNORMAL LOW (ref >60)
Glucose, Bld: 183 mg/dL — ABNORMAL HIGH (ref 70–99)
Potassium: 4 mmol/L (ref 3.5–5.1)
Sodium: 137 mmol/L (ref 135–145)
Total Bilirubin: 1.4 mg/dL — ABNORMAL HIGH (ref 0.3–1.2)
Total Protein: 5.4 g/dL — ABNORMAL LOW (ref 6.5–8.1)

## 2017-10-31 LAB — GLUCOSE, CAPILLARY
GLUCOSE-CAPILLARY: 248 mg/dL — AB (ref 70–99)
GLUCOSE-CAPILLARY: 258 mg/dL — AB (ref 70–99)
Glucose-Capillary: 264 mg/dL — ABNORMAL HIGH (ref 70–99)

## 2017-10-31 LAB — CBC
HCT: 37.3 % — ABNORMAL LOW (ref 39.0–52.0)
Hemoglobin: 12.7 g/dL — ABNORMAL LOW (ref 13.0–17.0)
MCH: 31.9 pg (ref 26.0–34.0)
MCHC: 34 g/dL (ref 30.0–36.0)
MCV: 93.7 fL (ref 78.0–100.0)
PLATELETS: 191 10*3/uL (ref 150–400)
RBC: 3.98 MIL/uL — AB (ref 4.22–5.81)
RDW: 13.9 % (ref 11.5–15.5)
WBC: 15.1 10*3/uL — ABNORMAL HIGH (ref 4.0–10.5)

## 2017-10-31 LAB — HEPARIN LEVEL (UNFRACTIONATED): Heparin Unfractionated: >2.2 IU/mL — ABNORMAL HIGH (ref 0.30–0.70)

## 2017-10-31 LAB — APTT
aPTT: >200 seconds (ref 24–36)
aPTT: >200 seconds (ref 24–36)
aPTT: >200 seconds (ref 24–36)

## 2017-10-31 MED ORDER — DEXAMETHASONE SODIUM PHOSPHATE 10 MG/ML IJ SOLN
4.0000 mg | Freq: Two times a day (BID) | INTRAMUSCULAR | Status: DC
  Administered 2017-10-31 – 2017-11-01 (×2): 4 mg via INTRAVENOUS
  Filled 2017-10-31 (×2): qty 1

## 2017-10-31 MED ORDER — HYDRALAZINE HCL 20 MG/ML IJ SOLN
10.0000 mg | Freq: Once | INTRAMUSCULAR | Status: DC
  Filled 2017-10-31: qty 1

## 2017-10-31 MED ORDER — ACETAMINOPHEN 325 MG PO TABS
650.0000 mg | ORAL_TABLET | Freq: Four times a day (QID) | ORAL | Status: DC | PRN
  Administered 2017-10-31: 650 mg via ORAL
  Filled 2017-10-31: qty 2

## 2017-10-31 MED ORDER — HYDRALAZINE HCL 20 MG/ML IJ SOLN
10.0000 mg | Freq: Four times a day (QID) | INTRAMUSCULAR | Status: DC | PRN
  Administered 2017-10-31: 10 mg via INTRAVENOUS
  Filled 2017-10-31 (×2): qty 1

## 2017-10-31 MED ORDER — HEPARIN (PORCINE) IN NACL 100-0.45 UNIT/ML-% IJ SOLN
900.0000 [IU]/h | INTRAMUSCULAR | Status: DC
  Administered 2017-10-31: 900 [IU]/h via INTRAVENOUS

## 2017-10-31 MED ORDER — INSULIN ASPART 100 UNIT/ML ~~LOC~~ SOLN
5.0000 [IU] | Freq: Once | SUBCUTANEOUS | Status: AC
  Administered 2017-10-31: 5 [IU] via SUBCUTANEOUS

## 2017-10-31 MED ORDER — INSULIN ASPART 100 UNIT/ML ~~LOC~~ SOLN
0.0000 [IU] | Freq: Three times a day (TID) | SUBCUTANEOUS | Status: DC
  Administered 2017-10-31: 3 [IU] via SUBCUTANEOUS
  Administered 2017-10-31: 5 [IU] via SUBCUTANEOUS
  Administered 2017-11-01: 3 [IU] via SUBCUTANEOUS
  Administered 2017-11-01: 5 [IU] via SUBCUTANEOUS
  Administered 2017-11-01 – 2017-11-02 (×2): 3 [IU] via SUBCUTANEOUS
  Administered 2017-11-02: 1 [IU] via SUBCUTANEOUS
  Administered 2017-11-02: 3 [IU] via SUBCUTANEOUS
  Administered 2017-11-03: 2 [IU] via SUBCUTANEOUS
  Administered 2017-11-03: 5 [IU] via SUBCUTANEOUS
  Administered 2017-11-04 (×3): 2 [IU] via SUBCUTANEOUS
  Administered 2017-11-05 (×2): 1 [IU] via SUBCUTANEOUS

## 2017-10-31 MED ORDER — HYDROMORPHONE HCL 1 MG/ML IJ SOLN
1.0000 mg | INTRAMUSCULAR | Status: DC | PRN
  Administered 2017-10-31 (×2): 1 mg via INTRAVENOUS
  Filled 2017-10-31 (×3): qty 1

## 2017-10-31 NOTE — Progress Notes (Signed)
Subjective: Patient reports less painful today  Objective: Vital signs in last 24 hours: Temp:  [97.5 F (36.4 C)-98.4 F (36.9 C)] 98.4 F (36.9 C) (09/22 1228) Pulse Rate:  [46-110] 83 (09/22 1228) Resp:  [14-28] 16 (09/22 1228) BP: (113-206)/(49-107) 113/75 (09/22 1228) SpO2:  [94 %-99 %] 94 % (09/22 1228) Weight:  [75.4 kg-78.2 kg] 78.2 kg (09/22 1228)  Intake/Output from previous day: 09/21 0701 - 09/22 0700 In: 674 [P.O.:120; I.V.:554] Out: -  Intake/Output this shift: No intake/output data recorded.  Physical Exam: Patient's neck and arm pain are improved.  He continues to have significant weakness in his left Deltoid.  Lab Results: Recent Labs    10/30/17 0838 10/31/17 0646  WBC 13.8* 15.1*  HGB 11.6* 12.7*  HCT 35.1* 37.3*  PLT 186 191   BMET Recent Labs    10/30/17 0838 10/31/17 0646  NA 136 137  K 3.5 4.0  CL 101 104  CO2 25 19*  GLUCOSE 120* 183*  BUN 19 15  CREATININE 1.35* 1.21  CALCIUM 8.5* 8.1*    Studies/Results: Ct Cervical Spine Wo Contrast  Result Date: 10/30/2017 CLINICAL DATA:  Left-sided neck pain. Painful range of motion. Pain extends into the left hip and leg. EXAM: CT CERVICAL SPINE WITHOUT CONTRAST TECHNIQUE: Multidetector CT imaging of the cervical spine was performed without intravenous contrast. Multiplanar CT image reconstructions were also generated. COMPARISON:  CT angiogram of the neck dated 07/19/2017 FINDINGS: Alignment: Chronic 2 mm spondylolisthesis at C7-T1. Skull base and vertebrae: No acute fracture. No primary bone lesion or focal pathologic process. Soft tissues and spinal canal: No prevertebral fluid or swelling. No visible canal hematoma. Disc levels: C1-2: Chronic hypertrophy of the soft tissues adjacent to the transverse ligament posterior to the odontoid process, asymmetrically prominent to the right with slight compression of the thecal sac to the right of midline best demonstrated on image 27 of series 4. The  appearance is essentially unchanged. C2-3: Disc osteophyte complex extends into the left lateral recess and left neural foramen without severe foraminal stenosis. Moderate left facet arthritis. C3-4: Disc space narrowing. Broad-based disc osteophyte complex asymmetric to the left with narrowing of both lateral recesses severe bilateral foraminal stenosis, unchanged. Narrowing of the AP dimension of the spinal canal to approximately 7 mm, unchanged. Moderate left facet arthritis. C4-5: Disc space narrowing. Broad-based disc osteophyte complex with lobulated disc extrusion extending inferiorly behind the body of C5, essentially unchanged since the prior study. The extrusion compresses the right lateral recess and could affect the right C6 nerve. Severe chronic bilateral foraminal stenosis. Moderate bilateral facet arthritis. C5-6: Minimal broad-based disc osteophyte complex without neural impingement. Widely patent neural foramina. Minimal left facet arthritis. C6-7: Previous bilateral laminotomies. Osteophytes fuse the C6-7 vertebral bodies. Calcification of posterior longitudinal ligament narrows the AP dimension of the spinal canal. Widely patent neural foramina. C7-T1: Moderate bilateral facet arthritis. 2 mm spondylolisthesis with disc space narrowing. Broad-based disc osteophyte complex without neural impingement. C7-T1: Disc space narrowing. Moderate right facet arthritis. Moderate right foraminal stenosis. Upper chest: Negative. Other: None IMPRESSION: 1. No acute abnormality of the cervical spine. No appreciable change since the prior CT angiogram of the neck dated 07/19/2017. 2. 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 3. Chronic soft disc extrusion at C4-5 asymmetric to the right as described. Electronically Signed   By: Lorriane Shire M.D.   On: 10/30/2017 08:05   Mr Cervical Spine  W Or Wo Contrast  Result Date:  10/30/2017 CLINICAL DATA:  Neck pain. Infection suspected. Diabetes. Chronic kidney disease. EXAM: MRI CERVICAL SPINE WITHOUT AND WITH CONTRAST TECHNIQUE: Multiplanar and multiecho pulse sequences of the cervical spine, to include the craniocervical junction and cervicothoracic junction, were obtained without and with intravenous contrast. CONTRAST:  Gadavist, 7 mL. COMPARISON:  CT cervical spine 10/30/2017.  CT head 07/19/2017. FINDINGS: Alignment: Anatomic except for 3 mm anterolisthesis C7-T1. Vertebrae: No worrisome osseous lesion. No evidence of diskitis, or focal bone lesion. Cord: Multilevel cord compression, described below. No abnormal cord signal. Posterior Fossa, vertebral arteries, paraspinal tissues: No tonsillar herniation. Chronic LEFT cerebellar infarct. No paraspinous effusion or signs of infection. Marked pannus extends behind the odontoid greater on the RIGHT as far cephalad as the foramen magnum. There is no significant cervicomedullary compression. Disc levels: C2-3: Annular bulge. Asymmetric facet arthropathy to the LEFT. No stenosis but LEFT C3 foraminal narrowing is noted. C3-4: Disc space narrowing. Central disc extrusion, with moderate cord flattening canal diameter 5-6 mm. Facet arthropathy. BILATERAL C4 foraminal narrowing, greater on the LEFT. C4-5: Disc space narrowing. Central disc extrusion, caudally migrated free fragments central and to the RIGHT. Severe cord compression, canal diameter 4-5 mm. BILATERAL C5 foraminal narrowing is compounded by facet arthropathy, worse on the RIGHT. C5-6: Disc desiccation. Shallow protrusion. Uncinate spurring and facet arthropathy in LEFT result in C6 foraminal narrowing. C6-7: Disc space narrowing. Central protrusion. Slight cord flattening. No definite C7 foraminal narrowing. C7-T1: 3 mm anterolisthesis. Central disc extrusion with osseous spurring. Severe facet arthropathy. Mild stenosis without cord flattening. LEFT C8 foraminal narrowing. Post  infusion no concerning abnormal enhancement. IMPRESSION: Multilevel spondylosis, with areas of significant spinal stenosis at C3-4 and C4-5 due to disc herniations. Cord compression with canal diameter as narrow as 4 mm. No signs of spinal infection, either diskitis or septic facet joint arthritis. Marked pannus, but no cervicomedullary compression. Electronically Signed   By: Staci Righter M.D.   On: 10/30/2017 17:49    Assessment/Plan: On heparin.  Decadron has helped with pain control.  Plan OR with Dr. Ronnald Ramp this week.    LOS: 1 day    Cecille Mcclusky D, MD 10/31/2017, 1:20 PM

## 2017-10-31 NOTE — Progress Notes (Signed)
PROGRESS NOTE    [name]  TOI:[id number] DOB: March 17, 1940 DOA: 10/30/2017 PCP: Renato Shin, MD  Brief Narrative: [name] is a 77 y.o. male with medical history significant of chronic back pain/multiple spine surgeries, myasthenia gravis, CML, recent hospitalization with stroke, spinal stenosis, coronary artery disease, pulmonary embolism with DVT on chronic anticoagulation with Eliquis, who presented to the ER with sudden onset of sharp neck and shoulder pain since early Friday 9/20am. Underwent MRI of the cervical spine in the emergency room last night which noted -spine showed Multilevel spondylosis, with areas of significant spinal stenosis at C3-4 and C4-5 due to disc herniations. Cord compression with canal diameter as narrow as 4 mm. -neurosurgery consulted, medical admission recommended  Assessment & Plan:     Severe cervical stenosis/with radiculopathy/severe pain -Continue IV Decadron -Stopped Eliquis, now on IV heparin until Dr.Jones' evaluation for surgery -Pain control -He is moderate cardiac risk for an intermediate risk surgery, no further workup recommended, not on beta blocker at baseline likely due to his myasthenia -Check CBGs, sliding scale insulin    History of DVT and pulmonary embolism -Eliquis held, now on IV heparin   History of myasthenia gravis/ocular -Continue Mestinon, prednisone held, currently getting IV Decadron   History of CML -In remission, on Gleevac   History of prediabetes -Not on meds, diet controlled, presumed to be steroid-induced -Check CBGs and add insulin sliding scale while on IV Decadron   History of CAD/PAD - Remote history of CAD, on medical management due to numerous comorbidities - history of distal SFA stent in 11/2016, no longer on antiplatelet agents  History of CVA -ELiquis held now, continue IV heparin, statin   low-grade fever yesterday -Etiology unclear, urinalysis on unremarkable, MRI does not show evidence  of discitis/osteo -Monitor clinically   DVT prophylaxis:IV heparin Code Status: DO NOT RESUSCITATE, discussed with the patient and wife this morning Family Communication:wife at bedside Disposition Plan: to be determined will likely need rehabilitation post op  Consultants:   Neurosurgery Dr. Vertell Limber   Procedures:   Antimicrobials:    Subjective: -continues to have severe neck pain  Objective: Vitals:   10/31/17 0622 10/31/17 0824 10/31/17 0829 10/31/17 1059  BP: (!) 169/78 (!) 179/90 (!) 172/86 (!) 145/85  Pulse: 87 100    Resp:  16    Temp:  98.2 F (36.8 C)    TempSrc:      SpO2:  98%    Weight:      Height:        Intake/Output Summary (Last 24 hours) at 10/31/2017 1140 Last data filed at 10/31/2017 0336 Gross per 24 hour  Intake 674 ml  Output -  Net 674 ml   Filed Weights   10/30/17 1038 10/30/17 2230  Weight: 79.4 kg 75.4 kg    Examination:  General exam: Appears uncomfortable with Hard Collar, AAOx3 HEENT: C collar on Respiratory system: decreased air movement, no ronchi or wheezes  Cardiovascular system: S1 & S2 heard, RRR Gastrointestinal system: Abdomen is nondistended, soft and nontender.Normal bowel sounds heard. Central nervous system: Alert and oriented. No focal neurological deficits. Extremities: Symmetric 5 x 5 power. Skin: No rashes, lesions or ulcers Psychiatry: Judgement and insight appear normal. Mood & affect appropriate.     Data Reviewed:   CBC: Recent Labs  Lab 10/30/17 0838 10/31/17 0646  WBC 13.8* 15.1*  NEUTROABS 10.3*  --   HGB 11.6* 12.7*  HCT 35.1* 37.3*  MCV 94.9 93.7  PLT 186 191  Basic Metabolic Panel: Recent Labs  Lab 10/30/17 0838 10/31/17 0646  NA 136 137  K 3.5 4.0  CL 101 104  CO2 25 19*  GLUCOSE 120* 183*  BUN 19 15  CREATININE 1.35* 1.21  CALCIUM 8.5* 8.1*   GFR: Estimated Creatinine Clearance: 46.3 mL/min (by C-G formula based on SCr of 1.21 mg/dL). Liver Function Tests: Recent Labs    Lab 10/31/17 0646  AST 25  ALT 37  ALKPHOS 55  BILITOT 1.4*  PROT 5.4*  ALBUMIN 2.6*   No results for input(s): LIPASE, AMYLASE in the last 168 hours. No results for input(s): AMMONIA in the last 168 hours. Coagulation Profile: No results for input(s): INR, PROTIME in the last 168 hours. Cardiac Enzymes: No results for input(s): CKTOTAL, CKMB, CKMBINDEX, TROPONINI in the last 168 hours. BNP (last 3 results) Recent Labs    10/06/17 1139  PROBNP 73.0   HbA1C: No results for input(s): HGBA1C in the last 72 hours. CBG: No results for input(s): GLUCAP in the last 168 hours. Lipid Profile: No results for input(s): CHOL, HDL, LDLCALC, TRIG, CHOLHDL, LDLDIRECT in the last 72 hours. Thyroid Function Tests: No results for input(s): TSH, T4TOTAL, FREET4, T3FREE, THYROIDAB in the last 72 hours. Anemia Panel: No results for input(s): VITAMINB12, FOLATE, FERRITIN, TIBC, IRON, RETICCTPCT in the last 72 hours. Urine analysis:    Component Value Date/Time   COLORURINE YELLOW 10/30/2017 2024   APPEARANCEUR CLEAR 10/30/2017 2024   LABSPEC 1.021 10/30/2017 2024   PHURINE 6.0 10/30/2017 2024   GLUCOSEU NEGATIVE 10/30/2017 2024   GLUCOSEU NEGATIVE 04/09/2014 0910   HGBUR NEGATIVE 10/30/2017 2024   BILIRUBINUR NEGATIVE 10/30/2017 2024   KETONESUR NEGATIVE 10/30/2017 2024   PROTEINUR NEGATIVE 10/30/2017 2024   UROBILINOGEN 1.0 04/09/2014 0910   NITRITE NEGATIVE 10/30/2017 2024   LEUKOCYTESUR NEGATIVE 10/30/2017 2024   Sepsis Labs: @LABRCNTIP (procalcitonin:4,lacticidven:4)  ) Recent Results (from the past 240 hour(s))  Blood culture (routine x 2)     Status: None (Preliminary result)   Collection Time: 10/30/17  8:39 AM  Result Value Ref Range Status   Specimen Description BLOOD RIGHT ANTECUBITAL  Final   Special Requests   Final    BOTTLES DRAWN AEROBIC AND ANAEROBIC Blood Culture results may not be optimal due to an excessive volume of blood received in culture bottles   Culture    Final    NO GROWTH < 24 HOURS Performed at [facility] [address] [place], [place] [address]    Report Status PENDING  Incomplete  Blood culture (routine x 2)     Status: None (Preliminary result)   Collection Time: 10/30/17  8:40 AM  Result Value Ref Range Status   Specimen Description BLOOD LEFT HAND  Final   Special Requests   Final    BOTTLES DRAWN AEROBIC AND ANAEROBIC Blood Culture adequate volume   Culture   Final    NO GROWTH < 24 HOURS Performed at [facility], [place] [address], [place], [place] [address]    Report Status PENDING  Incomplete         Radiology Studies: Ct Cervical Spine Wo Contrast  Result Date: 10/30/2017 CLINICAL DATA:  Left-sided neck pain. Painful range of motion. Pain extends into the left hip and leg. EXAM: CT CERVICAL SPINE WITHOUT CONTRAST TECHNIQUE: Multidetector CT imaging of the cervical spine was performed without intravenous contrast. Multiplanar CT image reconstructions were also generated. COMPARISON:  CT angiogram of the neck dated 07/19/2017 FINDINGS: Alignment:  Chronic 2 mm spondylolisthesis at C7-T1. Skull base and vertebrae: No acute fracture. No primary bone lesion or focal pathologic process. Soft tissues and spinal canal: No prevertebral fluid or swelling. No visible canal hematoma. Disc levels: C1-2: Chronic hypertrophy of the soft tissues adjacent to the transverse ligament posterior to the odontoid process, asymmetrically prominent to the right with slight compression of the thecal sac to the right of midline best demonstrated on image 27 of series 4. The appearance is essentially unchanged. C2-3: Disc osteophyte complex extends into the left lateral recess and left neural foramen without severe foraminal stenosis. Moderate left facet arthritis. C3-4: Disc space narrowing. Broad-based disc osteophyte complex asymmetric to the left with narrowing of both lateral recesses severe bilateral foraminal stenosis, unchanged.  Narrowing of the AP dimension of the spinal canal to approximately 7 mm, unchanged. Moderate left facet arthritis. C4-5: Disc space narrowing. Broad-based disc osteophyte complex with lobulated disc extrusion extending inferiorly behind the body of C5, essentially unchanged since the prior study. The extrusion compresses the right lateral recess and could affect the right C6 nerve. Severe chronic bilateral foraminal stenosis. Moderate bilateral facet arthritis. C5-6: Minimal broad-based disc osteophyte complex without neural impingement. Widely patent neural foramina. Minimal left facet arthritis. C6-7: Previous bilateral laminotomies. Osteophytes fuse the C6-7 vertebral bodies. Calcification of posterior longitudinal ligament narrows the AP dimension of the spinal canal. Widely patent neural foramina. C7-T1: Moderate bilateral facet arthritis. 2 mm spondylolisthesis with disc space narrowing. Broad-based disc osteophyte complex without neural impingement. C7-T1: Disc space narrowing. Moderate right facet arthritis. Moderate right foraminal stenosis. Upper chest: Negative. Other: None IMPRESSION: 1. No acute abnormality of the cervical spine. No appreciable change since the prior CT angiogram of the neck dated 07/19/2017. 2. 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 3. Chronic soft disc extrusion at C4-5 asymmetric to the right as described. Electronically Signed   By: Lorriane Shire M.D.   On: 10/30/2017 08:05   Mr Cervical Spine W Or Wo Contrast  Result Date: 10/30/2017 CLINICAL DATA:  Neck pain. Infection suspected. Diabetes. Chronic kidney disease. EXAM: MRI CERVICAL SPINE WITHOUT AND WITH CONTRAST TECHNIQUE: Multiplanar and multiecho pulse sequences of the cervical spine, to include the craniocervical junction and cervicothoracic junction, were obtained without and with intravenous contrast. CONTRAST:  Gadavist, 7 mL.  COMPARISON:  CT cervical spine 10/30/2017.  CT head 07/19/2017. FINDINGS: Alignment: Anatomic except for 3 mm anterolisthesis C7-T1. Vertebrae: No worrisome osseous lesion. No evidence of diskitis, or focal bone lesion. Cord: Multilevel cord compression, described below. No abnormal cord signal. Posterior Fossa, vertebral arteries, paraspinal tissues: No tonsillar herniation. Chronic LEFT cerebellar infarct. No paraspinous effusion or signs of infection. Marked pannus extends behind the odontoid greater on the RIGHT as far cephalad as the foramen magnum. There is no significant cervicomedullary compression. Disc levels: C2-3: Annular bulge. Asymmetric facet arthropathy to the LEFT. No stenosis but LEFT C3 foraminal narrowing is noted. C3-4: Disc space narrowing. Central disc extrusion, with moderate cord flattening canal diameter 5-6 mm. Facet arthropathy. BILATERAL C4 foraminal narrowing, greater on the LEFT. C4-5: Disc space narrowing. Central disc extrusion, caudally migrated free fragments central and to the RIGHT. Severe cord compression, canal diameter 4-5 mm. BILATERAL C5 foraminal narrowing is compounded by facet arthropathy, worse on the RIGHT. C5-6: Disc desiccation. Shallow protrusion. Uncinate spurring and facet arthropathy in LEFT result in C6 foraminal narrowing. C6-7: Disc space narrowing. Central protrusion. Slight cord flattening.  No definite C7 foraminal narrowing. C7-T1: 3 mm anterolisthesis. Central disc extrusion with osseous spurring. Severe facet arthropathy. Mild stenosis without cord flattening. LEFT C8 foraminal narrowing. Post infusion no concerning abnormal enhancement. IMPRESSION: Multilevel spondylosis, with areas of significant spinal stenosis at C3-4 and C4-5 due to disc herniations. Cord compression with canal diameter as narrow as 4 mm. No signs of spinal infection, either diskitis or septic facet joint arthritis. Marked pannus, but no cervicomedullary compression. Electronically  Signed   By: Staci Righter M.D.   On: 10/30/2017 17:49        Scheduled Meds: . brimonidine  1 drop Both Eyes Q12H  . cholecalciferol  1,000 Units Oral Daily  . dexamethasone  4 mg Intravenous Q12H  . famotidine  20 mg Oral BID  . furosemide  20 mg Oral Daily  . hydrALAZINE  10 mg Intravenous Once  . imatinib  300 mg Oral Q1200  . insulin aspart  0-9 Units Subcutaneous TID WC  . isosorbide mononitrate  30 mg Oral Daily  . lisinopril  10 mg Oral Daily  . multivitamin with minerals  1 tablet Oral Daily  . pyridostigmine  60 mg Oral Daily  . rosuvastatin  5 mg Oral Daily  . timolol  1 drop Both Eyes BID   Continuous Infusions: . cefTRIAXone (ROCEPHIN)  IV    . heparin 1,300 Units/hr (10/30/17 2301)     LOS: 1 day    Time spent: 59min    Domenic Polite, MD [facility] Page via [email or web address], password [email or web address] After 7PM please contact night-coverage  10/31/2017, 11:40 AM

## 2017-10-31 NOTE — Progress Notes (Addendum)
Lab called critical ptt over 200. Pharmacy notified. Night shift RN Jami to notify MD.   Pharmacist gave verbal orders to turn off heparin drip for one hour.

## 2017-10-31 NOTE — Progress Notes (Signed)
Pt BP 172/86 manual, reporting tongue and lip numbness, Critical lab ptt over 200, MD notified. PRN hydralazine q6 given at 545, not due now.  MD gave verbal order to give IV hydralazine 10 mg one time. Will continue to monitor

## 2017-10-31 NOTE — Progress Notes (Addendum)
ANTICOAGULATION CONSULT NOTE - Follow Up Consult  Pharmacy Consult for heparin Indication: pulmonary embolus  Allergies  Allergen Reactions  . Phenergan [Promethazine Hcl] Swelling and Other (See Comments)    Cannot be combined with Zofran because swelling of the eyes resulted and patient became very restless-  . Zofran [Ondansetron Hcl] Swelling and Other (See Comments)    Cannot be combined with Phenergan because swelling of the eyes resulted and patient became very restless-  . Sulfamethoxazole Rash and Itching    Patient Measurements: Height: 5\' 7"  (170.2 cm) Weight: 166 lb 3.6 oz (75.4 kg) IBW/kg (Calculated) : 66.1 Heparin Dosing Weight: 75.4  Vital Signs: Temp: 98.2 F (36.8 C) (09/22 0824) Temp Source: Oral (09/22 0424) BP: 145/85 (09/22 1059) Pulse Rate: 100 (09/22 0824)  Labs: Recent Labs    10/30/17 0838 10/31/17 0646 10/31/17 0647 10/31/17 1005  HGB 11.6* 12.7*  --   --   HCT 35.1* 37.3*  --   --   PLT 186 191  --   --   APTT  --  >200*  --  >200*  HEPARINUNFRC  --   --  >2.20* >2.20*  CREATININE 1.35* 1.21  --   --     Estimated Creatinine Clearance: 46.3 mL/min (by C-G formula based on SCr of 1.21 mg/dL).   Medications:  Scheduled:  . brimonidine  1 drop Both Eyes Q12H  . cholecalciferol  1,000 Units Oral Daily  . dexamethasone  4 mg Intravenous Q12H  . famotidine  20 mg Oral BID  . furosemide  20 mg Oral Daily  . hydrALAZINE  10 mg Intravenous Once  . imatinib  300 mg Oral Q1200  . insulin aspart  0-9 Units Subcutaneous TID WC  . isosorbide mononitrate  30 mg Oral Daily  . lisinopril  10 mg Oral Daily  . multivitamin with minerals  1 tablet Oral Daily  . pyridostigmine  60 mg Oral Daily  . rosuvastatin  5 mg Oral Daily  . timolol  1 drop Both Eyes BID    Assessment: 77 yo man to start heparin for h/o DVT,PE. He will need surgery for cervical stenosis. Will need to monitor aPTT until effects of eliquis wear off.   9/22 Patient's hgb  11.6 > 12.7, platelets 186 > 191. No bleeding per nurse this morning.  Patient this morning had an elevated heparin / APTT level. Elevated level believed to be because of labs drawn at the same site of heparin infusion. Repeat heparin / APPT levels ordered today came back also elevated. Labs drawn on the same arm per patient's family.   Plans to decrease infusion slightly by 2 units /  and repeat APTT in 6 hours.     Goal of Therapy:  APTT 66-102 sec Heparin level 0.3-0.7 units/ml Monitor platelets by anticoagulation protocol: Yes  Plan:  Decrease heparin drip to 1150 units / hr Follow-up APTT in 6 hours at 1800 Monitor CBC, signs/symptoms of bleeding.   ---- Thank you for allowing pharmacy to be a part of this patient's care.  Tamela Gammon, PharmD 10/31/2017 12:11 PM PGY-1 Pharmacy Resident Direct Phone: [phone] Please check [email or web address] for unit-specific pharmacist phone numbers

## 2017-10-31 NOTE — Progress Notes (Signed)
ANTICOAGULATION CONSULT NOTE - Follow Up Consult  Pharmacy Consult for heparin Indication: pulmonary embolus  Allergies  Allergen Reactions  . Phenergan [Promethazine Hcl] Swelling and Other (See Comments)    Cannot be combined with Zofran because swelling of the eyes resulted and patient became very restless-  . Zofran [Ondansetron Hcl] Swelling and Other (See Comments)    Cannot be combined with Phenergan because swelling of the eyes resulted and patient became very restless-  . Sulfamethoxazole Rash and Itching    Patient Measurements: Height: 5\' 7"  (170.2 cm) Weight: 172 lb 6.4 oz (78.2 kg) IBW/kg (Calculated) : 66.1 Heparin Dosing Weight: 75.4  Vital Signs: Temp: 97.5 F (36.4 C) (09/22 1740) Temp Source: Oral (09/22 1740) BP: 157/89 (09/22 1740) Pulse Rate: 96 (09/22 1740)  Labs: Recent Labs    10/30/17 0838 10/31/17 0646 10/31/17 0647 10/31/17 1005 10/31/17 1816  HGB 11.6* 12.7*  --   --   --   HCT 35.1* 37.3*  --   --   --   PLT 186 191  --   --   --   APTT  --  >200*  --  >200* >200*  HEPARINUNFRC  --   --  >2.20* >2.20*  --   CREATININE 1.35* 1.21  --   --   --     Estimated Creatinine Clearance: 46.3 mL/min (by C-G formula based on SCr of 1.21 mg/dL).   Assessment: 77 yo man continues on IV heparin while apixaban is on hold. Heparin level and aPTT remain elevated and RN believes this level was drawn appropriately. No bleeding or other issues noted.   Goal of Therapy:  APTT 66-102 sec Heparin level 0.3-0.7 units/ml Monitor platelets by anticoagulation protocol: Yes  Plan:  Hold heparin x 1 hour Restart at 900 units/hr Check an 8 hr heparin level and aPTT Daily heparin level, aPTT and CBC  Salome Arnt, PharmD, BCPS Please see AMION for all pharmacy numbers 10/31/2017 7:26 PM

## 2017-10-31 NOTE — Progress Notes (Signed)
Critical lab received, ptt over 200 and heparin 2.20. MD and pharmacy notified. Will continue to monitor

## 2017-11-01 LAB — CBC
HEMATOCRIT: 30.2 % — AB (ref 39.0–52.0)
Hemoglobin: 10.3 g/dL — ABNORMAL LOW (ref 13.0–17.0)
MCH: 32.1 pg (ref 26.0–34.0)
MCHC: 34.1 g/dL (ref 30.0–36.0)
MCV: 94.1 fL (ref 78.0–100.0)
PLATELETS: 187 10*3/uL (ref 150–400)
RBC: 3.21 MIL/uL — AB (ref 4.22–5.81)
RDW: 14.4 % (ref 11.5–15.5)
WBC: 17.8 10*3/uL — AB (ref 4.0–10.5)

## 2017-11-01 LAB — GLUCOSE, CAPILLARY
Glucose-Capillary: 208 mg/dL — ABNORMAL HIGH (ref 70–99)
Glucose-Capillary: 262 mg/dL — ABNORMAL HIGH (ref 70–99)
Glucose-Capillary: 217 mg/dL — ABNORMAL HIGH (ref 70–99)
Glucose-Capillary: 217 mg/dL — ABNORMAL HIGH (ref 70–99)

## 2017-11-01 LAB — HEPARIN LEVEL (UNFRACTIONATED): Heparin Unfractionated: 1.64 IU/mL — ABNORMAL HIGH (ref 0.30–0.70)

## 2017-11-01 LAB — APTT
APTT: 87 s — AB (ref 24–36)
aPTT: 184 seconds (ref 24–36)
aPTT: 69 s — ABNORMAL HIGH (ref 24–36)

## 2017-11-01 LAB — SEDIMENTATION RATE: Sed Rate: 75 mm/h — ABNORMAL HIGH (ref 0–16)

## 2017-11-01 LAB — C-REACTIVE PROTEIN: CRP: 9.7 mg/dL — ABNORMAL HIGH

## 2017-11-01 MED ORDER — LORAZEPAM 2 MG/ML IJ SOLN
1.0000 mg | INTRAMUSCULAR | Status: DC | PRN
  Administered 2017-11-02: 1 mg via INTRAVENOUS
  Filled 2017-11-01 (×2): qty 1

## 2017-11-01 MED ORDER — INSULIN DETEMIR 100 UNIT/ML ~~LOC~~ SOLN
12.0000 [IU] | Freq: Every day | SUBCUTANEOUS | Status: DC
  Administered 2017-11-01 – 2017-11-02 (×2): 12 [IU] via SUBCUTANEOUS
  Filled 2017-11-01 (×2): qty 0.12

## 2017-11-01 MED ORDER — HEPARIN (PORCINE) IN NACL 100-0.45 UNIT/ML-% IJ SOLN
950.0000 [IU]/h | INTRAMUSCULAR | Status: DC
  Administered 2017-11-01: 700 [IU]/h via INTRAVENOUS
  Administered 2017-11-02: 800 [IU]/h via INTRAVENOUS
  Administered 2017-11-03: 950 [IU]/h via INTRAVENOUS
  Filled 2017-11-01 (×2): qty 250

## 2017-11-01 MED ORDER — DEXAMETHASONE SODIUM PHOSPHATE 10 MG/ML IJ SOLN
4.0000 mg | INTRAMUSCULAR | Status: DC
  Administered 2017-11-02: 4 mg via INTRAVENOUS
  Filled 2017-11-01: qty 1

## 2017-11-01 NOTE — Progress Notes (Signed)
ANTICOAGULATION CONSULT NOTE - Follow Up Consult  Pharmacy Consult for heparin Indication: history of pulmonary embolus and DVT (Eliquis on hold)  Allergies  Allergen Reactions  . Phenergan [Promethazine Hcl] Swelling and Other (See Comments)    Cannot be combined with Zofran because swelling of the eyes resulted and patient became very restless-  . Zofran [Ondansetron Hcl] Swelling and Other (See Comments)    Cannot be combined with Phenergan because swelling of the eyes resulted and patient became very restless-  . Sulfamethoxazole Rash and Itching    Patient Measurements: Height: 5\' 7"  (170.2 cm) Weight: 172 lb 6.4 oz (78.2 kg) IBW/kg (Calculated) : 66.1 Heparin Dosing Weight: 75.4  Vital Signs: Temp: 98 F (36.7 C) (09/23 1923) Temp Source: Oral (09/23 1923) BP: 122/65 (09/23 1923) Pulse Rate: 85 (09/23 1923)  Labs: Recent Labs    10/30/17 0838  10/31/17 0646 10/31/17 0647 10/31/17 1005  11/01/17 0436 11/01/17 1316 11/01/17 2042  HGB 11.6*  --  12.7*  --   --   --  10.3*  --   --   HCT 35.1*  --  37.3*  --   --   --  30.2*  --   --   PLT 186  --  191  --   --   --  187  --   --   APTT  --    < > >200*  --  >200*   < > 184* 87* 69*  HEPARINUNFRC  --   --   --  >2.20* >2.20*  --  1.64*  --   --   CREATININE 1.35*  --  1.21  --   --   --   --   --   --    < > = values in this interval not displayed.    Estimated Creatinine Clearance: 46.3 mL/min (by C-G formula based on SCr of 1.21 mg/dL).   Assessment: 77 yo M continues on IV heparin while apixaban is on hold pending possible surgical plans.  aPTT has trended down (87 > 69 sec) on heparin at 700 units/hr.    Will increase rate to maintain therapeutic goal.  Goal of Therapy:  aPTT 66-102 sec Heparin level 0.3-0.7 units/ml Monitor platelets by anticoagulation protocol: Yes  Plan:  Increase heparin to 800 units/hr Daily heparin level, aPTT and CBC Follow-up possible plans for surgery  Horton Chin,  Pharm.D., BCPS Clinical Pharmacist Pager: [phone] Clinical phone for 11/01/2017 is [phone].  **Pharmacist phone directory can now be found on [email or web address] ([email or web address]).  Listed under [place].  11/01/2017 9:42 PM

## 2017-11-01 NOTE — Progress Notes (Signed)
ANTICOAGULATION CONSULT NOTE - Follow Up Consult  Pharmacy Consult for heparin Indication: pulmonary embolus  Allergies  Allergen Reactions  . Phenergan [Promethazine Hcl] Swelling and Other (See Comments)    Cannot be combined with Zofran because swelling of the eyes resulted and patient became very restless-  . Zofran [Ondansetron Hcl] Swelling and Other (See Comments)    Cannot be combined with Phenergan because swelling of the eyes resulted and patient became very restless-  . Sulfamethoxazole Rash and Itching    Patient Measurements: Height: 5\' 7"  (170.2 cm) Weight: 172 lb 6.4 oz (78.2 kg) IBW/kg (Calculated) : 66.1 Heparin Dosing Weight: 75.4  Vital Signs: Temp: 98.2 F (36.8 C) (09/23 0404) Temp Source: Oral (09/23 0404) BP: 146/84 (09/23 0404) Pulse Rate: 91 (09/23 0404)  Labs: Recent Labs    10/30/17 0838  10/31/17 0646 10/31/17 0647 10/31/17 1005 10/31/17 1816 11/01/17 0436  HGB 11.6*  --  12.7*  --   --   --  10.3*  HCT 35.1*  --  37.3*  --   --   --  30.2*  PLT 186  --  191  --   --   --  187  APTT  --    < > >200*  --  >200* >200* 184*  HEPARINUNFRC  --   --   --  >2.20* >2.20*  --   --   CREATININE 1.35*  --  1.21  --   --   --   --    < > = values in this interval not displayed.    Estimated Creatinine Clearance: 46.3 mL/min (by C-G formula based on SCr of 1.21 mg/dL).   Assessment: 77 yo man continues on IV heparin while apixaban is on hold. APTT 184 sec this am  Goal of Therapy:  APTT 66-102 sec Heparin level 0.3-0.7 units/ml Monitor platelets by anticoagulation protocol: Yes  Plan:  Hold heparin x 1 hour Restart at 700 units/hr Check 6 hr aPTT Daily heparin level, aPTT and CBC  Thanks for allowing pharmacy to be a part of this patient's care.  Excell Seltzer, PharmD Clinical Pharmacist

## 2017-11-01 NOTE — Progress Notes (Signed)
ANTICOAGULATION CONSULT NOTE - Follow Up Consult  Pharmacy Consult for heparin Indication: pulmonary embolus  Allergies  Allergen Reactions  . Phenergan [Promethazine Hcl] Swelling and Other (See Comments)    Cannot be combined with Zofran because swelling of the eyes resulted and patient became very restless-  . Zofran [Ondansetron Hcl] Swelling and Other (See Comments)    Cannot be combined with Phenergan because swelling of the eyes resulted and patient became very restless-  . Sulfamethoxazole Rash and Itching    Patient Measurements: Height: 5\' 7"  (170.2 cm) Weight: 172 lb 6.4 oz (78.2 kg) IBW/kg (Calculated) : 66.1 Heparin Dosing Weight: 75.4  Vital Signs: Temp: 98.2 F (36.8 C) (09/23 1333) Temp Source: Oral (09/23 1333) BP: 133/78 (09/23 1333) Pulse Rate: 92 (09/23 1333)  Labs: Recent Labs    10/30/17 7078  10/31/17 0646 10/31/17 0647 10/31/17 1005 10/31/17 1816 11/01/17 0436 11/01/17 1316  HGB 11.6*  --  12.7*  --   --   --  10.3*  --   HCT 35.1*  --  37.3*  --   --   --  30.2*  --   PLT 186  --  191  --   --   --  187  --   APTT  --    < > >200*  --  >200* >200* 184* 87*  HEPARINUNFRC  --   --   --  >2.20* >2.20*  --  1.64*  --   CREATININE 1.35*  --  1.21  --   --   --   --   --    < > = values in this interval not displayed.    Estimated Creatinine Clearance: 46.3 mL/min (by C-G formula based on SCr of 1.21 mg/dL).   Assessment: 77 yo man continues on IV heparin while apixaban is on hold. APTT 87 sec this afternoon  Goal of Therapy:  APTT 66-102 sec Heparin level 0.3-0.7 units/ml Monitor platelets by anticoagulation protocol: Yes  Plan:  Continue heparin at 700 units/hr Check 8 hr confirmatory aPTT Daily heparin level, aPTT and CBC  [name] A. [name], PharmD, [name] Pager: [phone] Please utilize Amion for appropriate phone number to reach the unit pharmacist ([name])

## 2017-11-01 NOTE — Progress Notes (Signed)
Patient ID: [name], male   DOB: Jun 25, 1940, 77 y.o.   MRN: [id number] Subjective: Patient reports of severe pain in the left sternoclavicular region.  Does have some posterior shoulder pain.  Complains of pain moving the left shoulder  Objective: Vital signs in last 24 hours: Temp:  [97.4 F (36.3 C)-98.4 F (36.9 C)] 98.2 F (36.8 C) (09/23 0404) Pulse Rate:  [65-100] 91 (09/23 0404) Resp:  [16-18] 18 (09/23 0404) BP: (113-179)/(65-90) 146/84 (09/23 0404) SpO2:  [94 %-99 %] 97 % (09/23 0404) Weight:  [78.2 kg] 78.2 kg (09/22 1228)  Intake/Output from previous day: 09/22 0701 - 09/23 0700 In: 330 [P.O.:240; I.V.:90] Out: 600 [Urine:600] Intake/Output this shift: No intake/output data recorded.  Neurologic: Grossly normal with exquisite tenderness and may be some early redness and swelling over the left sternoclavicular joint  Lab Results: Lab Results  Component Value Date   WBC 17.8 (H) 11/01/2017   HGB 10.3 (L) 11/01/2017   HCT 30.2 (L) 11/01/2017   MCV 94.1 11/01/2017   PLT 187 11/01/2017   Lab Results  Component Value Date   INR 1.25 07/19/2017   BMET Lab Results  Component Value Date   NA 137 10/31/2017   K 4.0 10/31/2017   CL 104 10/31/2017   CO2 19 (L) 10/31/2017   GLUCOSE 183 (H) 10/31/2017   BUN 15 10/31/2017   CREATININE 1.21 10/31/2017   CALCIUM 8.1 (L) 10/31/2017    Studies/Results: Mr Cervical Spine W Or Wo Contrast  Result Date: 10/30/2017 CLINICAL DATA:  Neck pain. Infection suspected. Diabetes. Chronic kidney disease. EXAM: MRI CERVICAL SPINE WITHOUT AND WITH CONTRAST TECHNIQUE: Multiplanar and multiecho pulse sequences of the cervical spine, to include the craniocervical junction and cervicothoracic junction, were obtained without and with intravenous contrast. CONTRAST:  Gadavist, 7 mL. COMPARISON:  CT cervical spine 10/30/2017.  CT head 07/19/2017. FINDINGS: Alignment: Anatomic except for 3 mm anterolisthesis C7-T1. Vertebrae: No worrisome  osseous lesion. No evidence of diskitis, or focal bone lesion. Cord: Multilevel cord compression, described below. No abnormal cord signal. Posterior Fossa, vertebral arteries, paraspinal tissues: No tonsillar herniation. Chronic LEFT cerebellar infarct. No paraspinous effusion or signs of infection. Marked pannus extends behind the odontoid greater on the RIGHT as far cephalad as the foramen magnum. There is no significant cervicomedullary compression. Disc levels: C2-3: Annular bulge. Asymmetric facet arthropathy to the LEFT. No stenosis but LEFT C3 foraminal narrowing is noted. C3-4: Disc space narrowing. Central disc extrusion, with moderate cord flattening canal diameter 5-6 mm. Facet arthropathy. BILATERAL C4 foraminal narrowing, greater on the LEFT. C4-5: Disc space narrowing. Central disc extrusion, caudally migrated free fragments central and to the RIGHT. Severe cord compression, canal diameter 4-5 mm. BILATERAL C5 foraminal narrowing is compounded by facet arthropathy, worse on the RIGHT. C5-6: Disc desiccation. Shallow protrusion. Uncinate spurring and facet arthropathy in LEFT result in C6 foraminal narrowing. C6-7: Disc space narrowing. Central protrusion. Slight cord flattening. No definite C7 foraminal narrowing. C7-T1: 3 mm anterolisthesis. Central disc extrusion with osseous spurring. Severe facet arthropathy. Mild stenosis without cord flattening. LEFT C8 foraminal narrowing. Post infusion no concerning abnormal enhancement. IMPRESSION: Multilevel spondylosis, with areas of significant spinal stenosis at C3-4 and C4-5 due to disc herniations. Cord compression with canal diameter as narrow as 4 mm. No signs of spinal infection, either diskitis or septic facet joint arthritis. Marked pannus, but no cervicomedullary compression. Electronically Signed   By: Staci Righter M.D.   On: 10/30/2017 17:49    Assessment/Plan: Patient  is exquisitely tender over his left sternoclavicular joint which is also  swollen and may be slightly red.  I am concerned about infection here, especially with his increasing white count and clear urine.  He is on Rocephin empirically.  His pain is not overly consistent with radicular pain.  I have ordered an MRI of the sternum with and without contrast with focus on the left sternoclavicular joint.  If he does indeed require cervical surgery during this hospital stay he will need a retractable [place] filter placed prior to surgery because he will have to be off anticoagulants before and after the surgery, and he has a history of DVT/PE and stroke  Estimated body mass index is 27 kg/m as calculated from the following:   Height as of this encounter: 5\' 7"  (1.702 m).   Weight as of this encounter: 78.2 kg.    LOS: 2 days    Staria Birkhead S 11/01/2017, 8:21 AM

## 2017-11-01 NOTE — Progress Notes (Signed)
PROGRESS NOTE    [name]  DGU:[id number] DOB: 07/25/1940 DOA: 10/30/2017 PCP: Renato Shin, MD  Brief Narrative: [name] is a 77 y.o. male with medical history significant of chronic back pain/multiple spine surgeries, myasthenia gravis, CML, recent hospitalization with stroke, spinal stenosis, coronary artery disease, pulmonary embolism with DVT on chronic anticoagulation with Eliquis, who presented to the ER with sudden onset of sharp neck and shoulder pain since early Friday 9/20am. Underwent MRI of the cervical spine in the emergency room last night which noted -spine showed Multilevel spondylosis, with areas of significant spinal stenosis at C3-4 and C4-5 due to disc herniations. Cord compression with canal diameter as narrow as 4 mm. -neurosurgery consulted, medical admission recommended  Assessment & Plan:     Severe cervical stenosis/with radiculopathy/severe pain -Continue IV Decadron -Stopped Eliquis, now on IV heparin, neurosurgery Dr. Ronnald Ramp consulting -Today with more pain anteriorly, at the left sternoclavicular region, MRI ordered by neurosurgery will follow up -Pain control -Part of his leukocytosis is secondary to IV Decadron -He is moderate cardiac risk for an intermediate risk surgery, no further workup recommended, not on beta blocker at baseline likely due to his myasthenia    History of DVT and pulmonary embolism -Eliquis held, now on IV heparin   History of myasthenia gravis/ocular -Continue Mestinon, prednisone held, currently getting IV Decadron   History of CML -In remission, on Gleevac   History of prediabetes -Not on meds, diet controlled, presumed to be steroid-induced -Now with hyperglycemia on IV Decadron, added Levemir, continue sliding scale insulin   History of CAD/PAD - Remote history of CAD, on medical management due to numerous comorbidities - history of distal SFA stent in 11/2016, no longer on antiplatelet agents  History of  CVA -ELiquis held now, continue IV heparin, statin   low-grade fever on admission -Etiology unclear, urinalysis on unremarkable, MRI does not show evidence of discitis/osteo -Monitor clinically -Follow-up MRI of the sterno/clavicular region   DVT prophylaxis:IV heparin Code Status: DO NOT RESUSCITATE, discussed with the patient and wife Family Communication:wife at bedside Disposition Plan: to be determined will likely need rehabilitation post op  Consultants:   Neurosurgery Dr. Vertell Limber and Dr. Ronnald Ramp   Procedures:   Antimicrobials:    Subjective: -Now having left anterior neck pain, as well as posteriorly  Objective: Vitals:   10/31/17 1952 11/01/17 0013 11/01/17 0404 11/01/17 0832  BP: 140/65 135/71 (!) 146/84 (!) 162/88  Pulse: 80 65 91 (!) 108  Resp: 18 18 18 17   Temp: (!) 97.5 F (36.4 C) (!) 97.4 F (36.3 C) 98.2 F (36.8 C)   TempSrc: Oral Oral Oral   SpO2: 99% 98% 97% 97%  Weight:      Height:        Intake/Output Summary (Last 24 hours) at 11/01/2017 1331 Last data filed at 11/01/2017 1329 Gross per 24 hour  Intake 1050 ml  Output 600 ml  Net 450 ml   Filed Weights   10/30/17 1038 10/30/17 2230 10/31/17 1228  Weight: 79.4 kg 75.4 kg 78.2 kg    Examination:  General exam: Appears uncomfortable with Hard Collar, AAOx3 HEENT: C collar on Respiratory system: decreased air movement, no ronchi or wheezes  Cardiovascular system: S1 & S2 heard, RRR Gastrointestinal system: Abdomen is nondistended, soft and nontender.Normal bowel sounds heard. Central nervous system: Alert and oriented. No focal neurological deficits. Extremities: Symmetric 5 x 5 power. Skin: No rashes, lesions or ulcers Psychiatry: Judgement and insight appear normal. Mood &  affect appropriate.     Data Reviewed:   CBC: Recent Labs  Lab 10/30/17 0838 10/31/17 0646 11/01/17 0436  WBC 13.8* 15.1* 17.8*  NEUTROABS 10.3*  --   --   HGB 11.6* 12.7* 10.3*  HCT 35.1* 37.3* 30.2*   MCV 94.9 93.7 94.1  PLT 186 191 151   Basic Metabolic Panel: Recent Labs  Lab 10/30/17 0838 10/31/17 0646  NA 136 137  K 3.5 4.0  CL 101 104  CO2 25 19*  GLUCOSE 120* 183*  BUN 19 15  CREATININE 1.35* 1.21  CALCIUM 8.5* 8.1*   GFR: Estimated Creatinine Clearance: 46.3 mL/min (by C-G formula based on SCr of 1.21 mg/dL). Liver Function Tests: Recent Labs  Lab 10/31/17 0646  AST 25  ALT 37  ALKPHOS 55  BILITOT 1.4*  PROT 5.4*  ALBUMIN 2.6*   No results for input(s): LIPASE, AMYLASE in the last 168 hours. No results for input(s): AMMONIA in the last 168 hours. Coagulation Profile: No results for input(s): INR, PROTIME in the last 168 hours. Cardiac Enzymes: No results for input(s): CKTOTAL, CKMB, CKMBINDEX, TROPONINI in the last 168 hours. BNP (last 3 results) Recent Labs    10/06/17 1139  PROBNP 73.0   HbA1C: No results for input(s): HGBA1C in the last 72 hours. CBG: Recent Labs  Lab 10/31/17 1224 10/31/17 1737 10/31/17 2130 11/01/17 0640 11/01/17 1157  GLUCAP 264* 248* 258* 208* 262*   Lipid Profile: No results for input(s): CHOL, HDL, LDLCALC, TRIG, CHOLHDL, LDLDIRECT in the last 72 hours. Thyroid Function Tests: No results for input(s): TSH, T4TOTAL, FREET4, T3FREE, THYROIDAB in the last 72 hours. Anemia Panel: No results for input(s): VITAMINB12, FOLATE, FERRITIN, TIBC, IRON, RETICCTPCT in the last 72 hours. Urine analysis:    Component Value Date/Time   COLORURINE YELLOW 10/30/2017 2024   APPEARANCEUR CLEAR 10/30/2017 2024   LABSPEC 1.021 10/30/2017 2024   PHURINE 6.0 10/30/2017 2024   GLUCOSEU NEGATIVE 10/30/2017 2024   GLUCOSEU NEGATIVE 04/09/2014 0910   HGBUR NEGATIVE 10/30/2017 2024   BILIRUBINUR NEGATIVE 10/30/2017 2024   KETONESUR NEGATIVE 10/30/2017 2024   PROTEINUR NEGATIVE 10/30/2017 2024   UROBILINOGEN 1.0 04/09/2014 0910   NITRITE NEGATIVE 10/30/2017 2024   LEUKOCYTESUR NEGATIVE 10/30/2017 2024   Sepsis  Labs: @LABRCNTIP (procalcitonin:4,lacticidven:4)  ) Recent Results (from the past 240 hour(s))  Blood culture (routine x 2)     Status: None (Preliminary result)   Collection Time: 10/30/17  8:39 AM  Result Value Ref Range Status   Specimen Description BLOOD RIGHT ANTECUBITAL  Final   Special Requests   Final    BOTTLES DRAWN AEROBIC AND ANAEROBIC Blood Culture results may not be optimal due to an excessive volume of blood received in culture bottles   Culture   Final    NO GROWTH 2 DAYS Performed at [facility] [address] [place], [place] [address]    Report Status PENDING  Incomplete  Blood culture (routine x 2)     Status: None (Preliminary result)   Collection Time: 10/30/17  8:40 AM  Result Value Ref Range Status   Specimen Description BLOOD LEFT HAND  Final   Special Requests   Final    BOTTLES DRAWN AEROBIC AND ANAEROBIC Blood Culture adequate volume   Culture   Final    NO GROWTH 2 DAYS Performed at [facility], [place] [address], [place], [place] [address]    Report Status PENDING  Incomplete  Radiology Studies: Mr Cervical Spine W Or Wo Contrast  Result Date: 10/30/2017 CLINICAL DATA:  Neck pain. Infection suspected. Diabetes. Chronic kidney disease. EXAM: MRI CERVICAL SPINE WITHOUT AND WITH CONTRAST TECHNIQUE: Multiplanar and multiecho pulse sequences of the cervical spine, to include the craniocervical junction and cervicothoracic junction, were obtained without and with intravenous contrast. CONTRAST:  Gadavist, 7 mL. COMPARISON:  CT cervical spine 10/30/2017.  CT head 07/19/2017. FINDINGS: Alignment: Anatomic except for 3 mm anterolisthesis C7-T1. Vertebrae: No worrisome osseous lesion. No evidence of diskitis, or focal bone lesion. Cord: Multilevel cord compression, described below. No abnormal cord signal. Posterior Fossa, vertebral arteries, paraspinal tissues: No tonsillar herniation. Chronic LEFT cerebellar infarct. No paraspinous effusion  or signs of infection. Marked pannus extends behind the odontoid greater on the RIGHT as far cephalad as the foramen magnum. There is no significant cervicomedullary compression. Disc levels: C2-3: Annular bulge. Asymmetric facet arthropathy to the LEFT. No stenosis but LEFT C3 foraminal narrowing is noted. C3-4: Disc space narrowing. Central disc extrusion, with moderate cord flattening canal diameter 5-6 mm. Facet arthropathy. BILATERAL C4 foraminal narrowing, greater on the LEFT. C4-5: Disc space narrowing. Central disc extrusion, caudally migrated free fragments central and to the RIGHT. Severe cord compression, canal diameter 4-5 mm. BILATERAL C5 foraminal narrowing is compounded by facet arthropathy, worse on the RIGHT. C5-6: Disc desiccation. Shallow protrusion. Uncinate spurring and facet arthropathy in LEFT result in C6 foraminal narrowing. C6-7: Disc space narrowing. Central protrusion. Slight cord flattening. No definite C7 foraminal narrowing. C7-T1: 3 mm anterolisthesis. Central disc extrusion with osseous spurring. Severe facet arthropathy. Mild stenosis without cord flattening. LEFT C8 foraminal narrowing. Post infusion no concerning abnormal enhancement. IMPRESSION: Multilevel spondylosis, with areas of significant spinal stenosis at C3-4 and C4-5 due to disc herniations. Cord compression with canal diameter as narrow as 4 mm. No signs of spinal infection, either diskitis or septic facet joint arthritis. Marked pannus, but no cervicomedullary compression. Electronically Signed   By: Staci Righter M.D.   On: 10/30/2017 17:49        Scheduled Meds: . brimonidine  1 drop Both Eyes Q12H  . cholecalciferol  1,000 Units Oral Daily  . dexamethasone  4 mg Intravenous Q12H  . famotidine  20 mg Oral BID  . furosemide  20 mg Oral Daily  . hydrALAZINE  10 mg Intravenous Once  . imatinib  300 mg Oral Q1200  . insulin aspart  0-9 Units Subcutaneous TID WC  . insulin detemir  12 Units Subcutaneous  Daily  . isosorbide mononitrate  30 mg Oral Daily  . lisinopril  10 mg Oral Daily  . multivitamin with minerals  1 tablet Oral Daily  . pyridostigmine  60 mg Oral Daily  . rosuvastatin  5 mg Oral Daily  . timolol  1 drop Both Eyes BID   Continuous Infusions: . cefTRIAXone (ROCEPHIN)  IV    . heparin 700 Units/hr (11/01/17 1275)     LOS: 2 days    Time spent: 4min    Domenic Polite, MD [facility] Page via [email or web address], password [email or web address] After 7PM please contact night-coverage  11/01/2017, 1:31 PM

## 2017-11-01 NOTE — Progress Notes (Addendum)
Received critical lab value on ptt of 184.  Notified pharmacy, instructed to turn heparin gtt off for 1 hour and then restart at lower rate of 700 units : 9ml/hr.  Notified triad oncall per protocol.

## 2017-11-01 NOTE — Progress Notes (Signed)
Inpatient Diabetes Program Recommendations  AACE/ADA: New Consensus Statement on Inpatient Glycemic Control (2015)  Target Ranges:  Prepandial:   less than 140 mg/dL      Peak postprandial:   less than 180 mg/dL (1-2 hours)      Critically ill patients:  140 - 180 mg/dL   Lab Results  Component Value Date   GLUCAP 262 (H) 11/01/2017   HGBA1C 6.8 (H) 10/06/2017    Review of Glycemic Control Results for MILTON, SAGONA (MRN [id number]) as of 11/01/2017 12:18  Ref. Range 10/31/2017 17:37 10/31/2017 21:30 11/01/2017 06:40 11/01/2017 11:57  Glucose-Capillary Latest Ref Range: 70 - 99 mg/dL 248 (H) 258 (H) 208 (H) 262 (H)   Diabetes history: Type 2 DM Outpatient Diabetes medications: none Current orders for Inpatient glycemic control: Novolog 0-9 units TID Decadron 4 mg BID  Inpatient Diabetes Program Recommendations:    In the setting of steroids, consider adding Levemir 12 units QD.   Thanks, Bronson Curb, MSN, RNC-OB Diabetes Coordinator [phone] (8a-5p)

## 2017-11-02 ENCOUNTER — Inpatient Hospital Stay (HOSPITAL_COMMUNITY): Payer: Medicare Other

## 2017-11-02 LAB — GLUCOSE, CAPILLARY
Glucose-Capillary: 216 mg/dL — ABNORMAL HIGH (ref 70–99)
Glucose-Capillary: 218 mg/dL — ABNORMAL HIGH (ref 70–99)
Glucose-Capillary: 240 mg/dL — ABNORMAL HIGH (ref 70–99)
Glucose-Capillary: 136 mg/dL — ABNORMAL HIGH (ref 70–99)
Glucose-Capillary: 206 mg/dL — ABNORMAL HIGH (ref 70–99)

## 2017-11-02 LAB — CBC
HEMATOCRIT: 29.2 % — AB (ref 39.0–52.0)
HEMOGLOBIN: 9.5 g/dL — AB (ref 13.0–17.0)
MCH: 31.4 pg (ref 26.0–34.0)
MCHC: 32.5 g/dL (ref 30.0–36.0)
MCV: 96.4 fL (ref 78.0–100.0)
PLATELETS: 188 10*3/uL (ref 150–400)
RBC: 3.03 MIL/uL — AB (ref 4.22–5.81)
RDW: 14.5 % (ref 11.5–15.5)
WBC: 15.1 10*3/uL — AB (ref 4.0–10.5)

## 2017-11-02 LAB — BASIC METABOLIC PANEL
Anion gap: 9 (ref 5–15)
BUN: 23 mg/dL (ref 8–23)
CHLORIDE: 103 mmol/L (ref 98–111)
CO2: 23 mmol/L (ref 22–32)
Calcium: 8.4 mg/dL — ABNORMAL LOW (ref 8.9–10.3)
Creatinine, Ser: 1.29 mg/dL — ABNORMAL HIGH (ref 0.61–1.24)
GFR calc Af Amer: 59 mL/min — ABNORMAL LOW (ref >60)
GFR, EST NON AFRICAN AMERICAN: 51 mL/min — AB (ref >60)
GLUCOSE: 221 mg/dL — AB (ref 70–99)
POTASSIUM: 4 mmol/L (ref 3.5–5.1)
Sodium: 135 mmol/L (ref 135–145)

## 2017-11-02 LAB — HEPARIN LEVEL (UNFRACTIONATED): HEPARIN UNFRACTIONATED: 1 [IU]/mL — AB (ref 0.30–0.70)

## 2017-11-02 LAB — APTT
APTT: 63 s — AB (ref 24–36)
aPTT: 81 seconds — ABNORMAL HIGH (ref 24–36)

## 2017-11-02 MED ORDER — INSULIN DETEMIR 100 UNIT/ML ~~LOC~~ SOLN
20.0000 [IU] | Freq: Every day | SUBCUTANEOUS | Status: DC
  Administered 2017-11-03 – 2017-11-06 (×4): 20 [IU] via SUBCUTANEOUS
  Filled 2017-11-02 (×4): qty 0.2

## 2017-11-02 MED ORDER — INSULIN DETEMIR 100 UNIT/ML ~~LOC~~ SOLN
8.0000 [IU] | Freq: Once | SUBCUTANEOUS | Status: AC
  Administered 2017-11-02: 8 [IU] via SUBCUTANEOUS
  Filled 2017-11-02: qty 0.08

## 2017-11-02 MED ORDER — GADOBUTROL 1 MMOL/ML IV SOLN
7.0000 mL | Freq: Once | INTRAVENOUS | Status: AC | PRN
  Administered 2017-11-02: 7 mL via INTRAVENOUS

## 2017-11-02 MED ORDER — LORAZEPAM 2 MG/ML IJ SOLN
1.0000 mg | Freq: Once | INTRAMUSCULAR | Status: AC
  Administered 2017-11-02: 1 mg via INTRAVENOUS

## 2017-11-02 NOTE — Progress Notes (Signed)
ANTICOAGULATION CONSULT NOTE - Follow Up Consult  Pharmacy Consult for heparin Indication: history of pulmonary embolus and DVT (Eliquis on hold)  Allergies  Allergen Reactions  . Phenergan [Promethazine Hcl] Swelling and Other (See Comments)    Cannot be combined with Zofran because swelling of the eyes resulted and patient became very restless-  . Zofran [Ondansetron Hcl] Swelling and Other (See Comments)    Cannot be combined with Phenergan because swelling of the eyes resulted and patient became very restless-  . Sulfamethoxazole Rash and Itching    Patient Measurements: Height: 5\' 7"  (170.2 cm) Weight: 172 lb 6.4 oz (78.2 kg) IBW/kg (Calculated) : 66.1 Heparin Dosing Weight: 75.4  Vital Signs: Temp: 97.5 F (36.4 C) (09/24 1144) Temp Source: Oral (09/24 1144) BP: 119/54 (09/24 1144) Pulse Rate: 52 (09/24 1144)  Labs: Recent Labs    10/31/17 0646  10/31/17 1005  11/01/17 0436  11/01/17 2042 11/02/17 0552 11/02/17 1624  HGB 12.7*  --   --   --  10.3*  --   --  9.5*  --   HCT 37.3*  --   --   --  30.2*  --   --  29.2*  --   PLT 191  --   --   --  187  --   --  188  --   APTT >200*  --  >200*   < > 184*   < > 69* 63* 81*  HEPARINUNFRC  --    < > >2.20*  --  1.64*  --   --  1.00*  --   CREATININE 1.21  --   --   --   --   --   --  1.29*  --    < > = values in this interval not displayed.    Estimated Creatinine Clearance: 43.4 mL/min (A) (by C-G formula based on SCr of 1.29 mg/dL (H)).   Assessment: 77 yo M continues on IV heparin while apixaban is on hold pending possible surgical plans.    APTT level this evening is therapeutic after a rate increase earlier today (aPTT 81 << 63, goal of 66-102). Hgb/Hct trending down however no active bleeding noted. Will watch.   Goal of Therapy:  aPTT 66-102 sec Heparin level 0.3-0.7 units/ml Monitor platelets by anticoagulation protocol: Yes  Plan:  - Continue Heparin at 950 units/hr (9.5 ml/hr) - Will continue to  monitor for any signs/symptoms of bleeding and will follow up with aPTT in 8 hours to confirm  Thank you for allowing pharmacy to be a part of this patient's care.  Alycia Rossetti, PharmD, BCPS Clinical Pharmacist Pager: [phone] Please check AMION for all [place] numbers 11/02/2017 6:42 PM

## 2017-11-02 NOTE — Progress Notes (Addendum)
Patient ID: [name], male   DOB: 05/11/1940, 77 y.o.   MRN: [id number] Still very focal pain, tenderness and swelling at L sternoclavicular joint. WBC 15k. ESR and CRP quite high. I suspect he has infection or acute arthropathy at L [place] joint. Moving all extremities well, no deltoid weakness noted, neg Hoffman's, gait not tested. I have no plans to operate in the near future given this scenario. The etiology of these findings must be diagnosed and treated first. He remains on empiric rocephin, but I'm unsure why based on current notes. Await MRI ordered yesterday

## 2017-11-02 NOTE — Progress Notes (Signed)
ANTICOAGULATION CONSULT NOTE - Follow Up Consult  Pharmacy Consult for heparin Indication: history of pulmonary embolus and DVT (Eliquis on hold)  Allergies  Allergen Reactions  . Phenergan [Promethazine Hcl] Swelling and Other (See Comments)    Cannot be combined with Zofran because swelling of the eyes resulted and patient became very restless-  . Zofran [Ondansetron Hcl] Swelling and Other (See Comments)    Cannot be combined with Phenergan because swelling of the eyes resulted and patient became very restless-  . Sulfamethoxazole Rash and Itching    Patient Measurements: Height: 5\' 7"  (170.2 cm) Weight: 172 lb 6.4 oz (78.2 kg) IBW/kg (Calculated) : 66.1 Heparin Dosing Weight: 75.4  Vital Signs: Temp: 98.1 F (36.7 C) (09/24 0344) Temp Source: Oral (09/24 0344) BP: 123/62 (09/24 0344) Pulse Rate: 85 (09/24 0344)  Labs: Recent Labs    10/30/17 0838  10/31/17 0646  10/31/17 1005  11/01/17 0436 11/01/17 1316 11/01/17 2042 11/02/17 0552  HGB 11.6*  --  12.7*  --   --   --  10.3*  --   --  9.5*  HCT 35.1*  --  37.3*  --   --   --  30.2*  --   --  29.2*  PLT 186  --  191  --   --   --  187  --   --  188  APTT  --    < > >200*  --  >200*   < > 184* 87* 69* 63*  HEPARINUNFRC  --   --   --    < > >2.20*  --  1.64*  --   --  1.00*  CREATININE 1.35*  --  1.21  --   --   --   --   --   --  1.29*   < > = values in this interval not displayed.    Estimated Creatinine Clearance: 43.4 mL/min (A) (by C-G formula based on SCr of 1.29 mg/dL (H)).   Assessment: 77 yo M continues on IV heparin while apixaban is on hold pending possible surgical plans.  aPTT has trended down (87 > 69 > 63 sec) on heparin at 800 units/hr. Heparin level now SUB therapeutic.    Will increase rate to maintain therapeutic goal. Heparin levels are still  not correlating with aPTTs. Will continue ordering aPTTs  Goal of Therapy:  aPTT 66-102 sec Heparin level 0.3-0.7 units/ml Monitor platelets by  anticoagulation protocol: Yes  Plan:  Increase heparin to 950 units/hr Daily heparin level, aPTT and CBC Follow-up possible plans for surgery  Uzziel Russey A. Levada Dy, PharmD, [place] Pager: [phone] Please utilize Amion for appropriate phone number to reach the unit pharmacist ([place])   11/02/2017 7:48 AM

## 2017-11-02 NOTE — Care Management Important Message (Signed)
Important Message  Patient Details  Name: [name] MRN: [id number] Date of Birth: 1941-01-08   Medicare Important Message Given:  Yes    Orbie Pyo 11/02/2017, 3:23 PM

## 2017-11-02 NOTE — Progress Notes (Signed)
PROGRESS NOTE    CON ARGANBRIGHT  GNO:[id number] DOB: 25-May-1940 DOA: 10/30/2017 PCP: Renato Shin, MD  Brief Narrative: [name] is a 77 y.o. male with medical history significant of chronic back pain/multiple spine surgeries, myasthenia gravis, CML, recent hospitalization with stroke, spinal stenosis, coronary artery disease, pulmonary embolism with DVT on chronic anticoagulation with Eliquis, who presented to the ER with sudden onset of sharp neck and shoulder pain since early Friday 9/20am. Underwent MRI of the cervical spine in the emergency room last night which noted -spine showed Multilevel spondylosis, with areas of significant spinal stenosis at C3-4 and C4-5 due to disc herniations. Cord compression with canal diameter as narrow as 4 mm. -neurosurgery consulted, medical admission recommended  Assessment & Plan:     Severe cervical stenosis/with radiculopathy/severe pain -Remains on IV Decadron -Stopped Eliquis, now on IV heparin, neurosurgery Dr. Ronnald Ramp consulting -Yesterday he started having more pain anteriorly at the left sternoclavicular region, MRI of the sternum was ordered by Dr. Ronnald Ramp suspecting infection, pending at this time  -Pain control -Part of his leukocytosis is secondary to IV Decadron -He is moderate cardiac risk for an intermediate risk surgery, no further workup recommended, not on beta blocker at baseline likely due to his myasthenia -He remains without any antibiotics only got a single dose of IV ceftriaxone in the emergency room -Remains afebrile however he is immunosuppressed from prednisone/ Gleevec/CML    History of DVT and pulmonary embolism -Eliquis held, now on IV heparin   History of myasthenia gravis/ocular -Continue Mestinon, prednisone held, currently getting IV Decadron   History of CML -In remission, on Gleevac   History of prediabetes -Not on meds, diet controlled, presumed to be steroid-induced -Now with hyperglycemia on IV Decadron,  started Levemir, increased dose   History of CAD/PAD - Remote history of CAD, on medical management due to numerous comorbidities - history of distal SFA stent in 11/2016, no longer on antiplatelet agents  History of CVA -ELiquis held now, continue IV heparin, statin   low-grade fever on admission -Etiology unclear, urinalysis on unremarkable, MRI does not show evidence of discitis/osteo -Monitor clinically -Follow-up MRI of the sterno/clavicular region   DVT prophylaxis:IV heparin Code Status: DO NOT RESUSCITATE, discussed with the patient and wife Family Communication:wife at bedside Disposition Plan: to be determined will likely need rehabilitation post op  Consultants:   Neurosurgery Dr. Vertell Limber and Dr. Ronnald Ramp   Procedures:   Antimicrobials:    Subjective: -Still waiting for MRI, continues to have posterior neck pain and anterior sternoclavicular neck pain  Objective: Vitals:   11/01/17 2326 11/02/17 0344 11/02/17 0806 11/02/17 1144  BP: 117/64 123/62 (!) 159/90 (!) 119/54  Pulse: 90 85 (!) 102 (!) 52  Resp: 18 20 20 16   Temp: 97.7 F (36.5 C) 98.1 F (36.7 C) (!) 97.4 F (36.3 C) (!) 97.5 F (36.4 C)  TempSrc: Oral Oral Oral Oral  SpO2: 99% 97% 97% 97%  Weight:      Height:        Intake/Output Summary (Last 24 hours) at 11/02/2017 1540 Last data filed at 11/02/2017 0400 Gross per 24 hour  Intake 254 ml  Output -  Net 254 ml   Filed Weights   10/30/17 1038 10/30/17 2230 10/31/17 1228  Weight: 79.4 kg 75.4 kg 78.2 kg    Examination:  Gen: Awake, Alert, Oriented X 3, chronically ill, uncomfortable appearing HEENT: PERRLA, hard collar is off Lungs: Decreased breath sounds at both bases CVS: RRR,No Gallops,Rubs  or new Murmurs Abd: soft, Non tender, non distended, BS present Extremities: No edema Skin: no new rashes Neuro:, Left deltoid weakness, moves other extremities without localizing signs Psychiatry: Judgement and insight appear normal. Mood &  affect appropriate.     Data Reviewed:   CBC: Recent Labs  Lab 10/30/17 0838 10/31/17 0646 11/01/17 0436 11/02/17 0552  WBC 13.8* 15.1* 17.8* 15.1*  NEUTROABS 10.3*  --   --   --   HGB 11.6* 12.7* 10.3* 9.5*  HCT 35.1* 37.3* 30.2* 29.2*  MCV 94.9 93.7 94.1 96.4  PLT 186 191 187 824   Basic Metabolic Panel: Recent Labs  Lab 10/30/17 0838 10/31/17 0646 11/02/17 0552  NA 136 137 135  K 3.5 4.0 4.0  CL 101 104 103  CO2 25 19* 23  GLUCOSE 120* 183* 221*  BUN 19 15 23   CREATININE 1.35* 1.21 1.29*  CALCIUM 8.5* 8.1* 8.4*   GFR: Estimated Creatinine Clearance: 43.4 mL/min (A) (by C-G formula based on SCr of 1.29 mg/dL (H)). Liver Function Tests: Recent Labs  Lab 10/31/17 0646  AST 25  ALT 37  ALKPHOS 55  BILITOT 1.4*  PROT 5.4*  ALBUMIN 2.6*   No results for input(s): LIPASE, AMYLASE in the last 168 hours. No results for input(s): AMMONIA in the last 168 hours. Coagulation Profile: No results for input(s): INR, PROTIME in the last 168 hours. Cardiac Enzymes: No results for input(s): CKTOTAL, CKMB, CKMBINDEX, TROPONINI in the last 168 hours. BNP (last 3 results) Recent Labs    10/06/17 1139  PROBNP 73.0   HbA1C: No results for input(s): HGBA1C in the last 72 hours. CBG: Recent Labs  Lab 11/01/17 1612 11/01/17 2110 11/02/17 0619 11/02/17 0827 11/02/17 1127  GLUCAP 217* 217* 216* 218* 240*   Lipid Profile: No results for input(s): CHOL, HDL, LDLCALC, TRIG, CHOLHDL, LDLDIRECT in the last 72 hours. Thyroid Function Tests: No results for input(s): TSH, T4TOTAL, FREET4, T3FREE, THYROIDAB in the last 72 hours. Anemia Panel: No results for input(s): VITAMINB12, FOLATE, FERRITIN, TIBC, IRON, RETICCTPCT in the last 72 hours. Urine analysis:    Component Value Date/Time   COLORURINE YELLOW 10/30/2017 2024   APPEARANCEUR CLEAR 10/30/2017 2024   LABSPEC 1.021 10/30/2017 2024   PHURINE 6.0 10/30/2017 2024   GLUCOSEU NEGATIVE 10/30/2017 2024   GLUCOSEU  NEGATIVE 04/09/2014 0910   HGBUR NEGATIVE 10/30/2017 2024   BILIRUBINUR NEGATIVE 10/30/2017 2024   KETONESUR NEGATIVE 10/30/2017 2024   PROTEINUR NEGATIVE 10/30/2017 2024   UROBILINOGEN 1.0 04/09/2014 0910   NITRITE NEGATIVE 10/30/2017 2024   LEUKOCYTESUR NEGATIVE 10/30/2017 2024   Sepsis Labs: @LABRCNTIP (procalcitonin:4,lacticidven:4)  ) Recent Results (from the past 240 hour(s))  Blood culture (routine x 2)     Status: None (Preliminary result)   Collection Time: 10/30/17  8:39 AM  Result Value Ref Range Status   Specimen Description BLOOD RIGHT ANTECUBITAL  Final   Special Requests   Final    BOTTLES DRAWN AEROBIC AND ANAEROBIC Blood Culture results may not be optimal due to an excessive volume of blood received in culture bottles   Culture   Final    NO GROWTH 3 DAYS Performed at [facility], [place] [address], [place], [place] [address]    Report Status PENDING  Incomplete  Blood culture (routine x 2)     Status: None (Preliminary result)   Collection Time: 10/30/17  8:40 AM  Result Value Ref Range Status   Specimen Description BLOOD LEFT HAND  Final  Special Requests   Final    BOTTLES DRAWN AEROBIC AND ANAEROBIC Blood Culture adequate volume   Culture   Final    NO GROWTH 3 DAYS Performed at [facility], [place] [address] [place], [place] [address]    Report Status PENDING  Incomplete         Radiology Studies: No results found.      Scheduled Meds: . brimonidine  1 drop Both Eyes Q12H  . cholecalciferol  1,000 Units Oral Daily  . dexamethasone  4 mg Intravenous Q24H  . famotidine  20 mg Oral BID  . furosemide  20 mg Oral Daily  . hydrALAZINE  10 mg Intravenous Once  . imatinib  300 mg Oral Q1200  . insulin aspart  0-9 Units Subcutaneous TID WC  . insulin detemir  12 Units Subcutaneous Daily  . isosorbide mononitrate  30 mg Oral Daily  . lisinopril  10 mg Oral Daily  . multivitamin with minerals  1 tablet Oral Daily  . pyridostigmine  60  mg Oral Daily  . rosuvastatin  5 mg Oral Daily  . timolol  1 drop Both Eyes BID   Continuous Infusions: . heparin 950 Units/hr (11/02/17 0805)     LOS: 3 days    Time spent: 86min    Domenic Polite, MD [facility] Page via [email or web address], password [email or web address] After 7PM please contact night-coverage  11/02/2017, 3:40 PM

## 2017-11-02 NOTE — Care Management Note (Signed)
Case Management Note  Patient Details  Name: [name] MRN: [id number] Date of Birth: 15-Sep-1940  Subjective/Objective:       Pt admitted with cervical cord compression. He is from home with spouse.             Action/Plan: Awaiting MRI. CM following for d/c needs, physician orders.  Expected Discharge Date:                  Expected Discharge Plan:  Home/Self Care  In-House Referral:     Discharge planning Services     Post Acute Care Choice:    Choice offered to:     DME Arranged:    DME Agency:     HH Arranged:    HH Agency:     Status of Service:  In process, will continue to follow  If discussed at Long Length of Stay Meetings, dates discussed:    Additional Comments:  Pollie Friar, RN 11/02/2017, 3:14 PM

## 2017-11-03 ENCOUNTER — Inpatient Hospital Stay (HOSPITAL_COMMUNITY): Payer: Medicare Other

## 2017-11-03 DIAGNOSIS — Z888 Allergy status to other drugs, medicaments and biological substances status: Secondary | ICD-10-CM

## 2017-11-03 DIAGNOSIS — E119 Type 2 diabetes mellitus without complications: Secondary | ICD-10-CM

## 2017-11-03 DIAGNOSIS — G7000 Myasthenia gravis without (acute) exacerbation: Secondary | ICD-10-CM

## 2017-11-03 DIAGNOSIS — C9211 Chronic myeloid leukemia, BCR/ABL-positive, in remission: Secondary | ICD-10-CM

## 2017-11-03 DIAGNOSIS — Z882 Allergy status to sulfonamides status: Secondary | ICD-10-CM

## 2017-11-03 DIAGNOSIS — M5031 Other cervical disc degeneration,  high cervical region: Secondary | ICD-10-CM

## 2017-11-03 DIAGNOSIS — M009 Pyogenic arthritis, unspecified: Secondary | ICD-10-CM

## 2017-11-03 DIAGNOSIS — M25512 Pain in left shoulder: Secondary | ICD-10-CM

## 2017-11-03 DIAGNOSIS — Z7952 Long term (current) use of systemic steroids: Secondary | ICD-10-CM

## 2017-11-03 DIAGNOSIS — Z79899 Other long term (current) drug therapy: Secondary | ICD-10-CM

## 2017-11-03 DIAGNOSIS — M4802 Spinal stenosis, cervical region: Secondary | ICD-10-CM

## 2017-11-03 LAB — GLUCOSE, CAPILLARY
GLUCOSE-CAPILLARY: 165 mg/dL — AB (ref 70–99)
GLUCOSE-CAPILLARY: 211 mg/dL — AB (ref 70–99)
Glucose-Capillary: 258 mg/dL — ABNORMAL HIGH (ref 70–99)
Glucose-Capillary: 219 mg/dL — ABNORMAL HIGH (ref 70–99)

## 2017-11-03 LAB — BASIC METABOLIC PANEL
Anion gap: 11 (ref 5–15)
BUN: 23 mg/dL (ref 8–23)
CALCIUM: 8.4 mg/dL — AB (ref 8.9–10.3)
CO2: 22 mmol/L (ref 22–32)
CREATININE: 1.28 mg/dL — AB (ref 0.61–1.24)
Chloride: 103 mmol/L (ref 98–111)
GFR calc non Af Amer: 52 mL/min — ABNORMAL LOW (ref >60)
GFR, EST AFRICAN AMERICAN: 60 mL/min — AB (ref >60)
Glucose, Bld: 179 mg/dL — ABNORMAL HIGH (ref 70–99)
Potassium: 4.2 mmol/L (ref 3.5–5.1)
SODIUM: 136 mmol/L (ref 135–145)

## 2017-11-03 LAB — CBC
HCT: 29.2 % — ABNORMAL LOW (ref 39.0–52.0)
Hemoglobin: 10 g/dL — ABNORMAL LOW (ref 13.0–17.0)
MCH: 32.5 pg (ref 26.0–34.0)
MCHC: 34.2 g/dL (ref 30.0–36.0)
MCV: 94.8 fL (ref 78.0–100.0)
Platelets: 170 10*3/uL (ref 150–400)
RBC: 3.08 MIL/uL — ABNORMAL LOW (ref 4.22–5.81)
RDW: 14.3 % (ref 11.5–15.5)
WBC: 12.7 10*3/uL — ABNORMAL HIGH (ref 4.0–10.5)

## 2017-11-03 LAB — APTT
APTT: 100 s — AB (ref 24–36)
aPTT: 98 seconds — ABNORMAL HIGH (ref 24–36)

## 2017-11-03 LAB — HEPARIN LEVEL (UNFRACTIONATED): HEPARIN UNFRACTIONATED: 0.98 [IU]/mL — AB (ref 0.30–0.70)

## 2017-11-03 MED ORDER — LIDOCAINE HCL (PF) 1 % IJ SOLN
INTRAMUSCULAR | Status: AC
  Filled 2017-11-03: qty 30

## 2017-11-03 MED ORDER — HEPARIN (PORCINE) IN NACL 100-0.45 UNIT/ML-% IJ SOLN
800.0000 [IU]/h | INTRAMUSCULAR | Status: AC
  Administered 2017-11-03: 950 [IU]/h via INTRAVENOUS
  Filled 2017-11-03 (×2): qty 250

## 2017-11-03 MED ORDER — VANCOMYCIN HCL IN DEXTROSE 750-5 MG/150ML-% IV SOLN
750.0000 mg | Freq: Two times a day (BID) | INTRAVENOUS | Status: DC
  Administered 2017-11-03 – 2017-11-05 (×4): 750 mg via INTRAVENOUS
  Filled 2017-11-03 (×5): qty 150

## 2017-11-03 MED ORDER — PREDNISONE 1 MG PO TABS
3.0000 mg | ORAL_TABLET | Freq: Every day | ORAL | Status: DC
  Administered 2017-11-03 – 2017-11-06 (×4): 3 mg via ORAL
  Filled 2017-11-03 (×4): qty 3

## 2017-11-03 NOTE — Progress Notes (Signed)
ANTICOAGULATION CONSULT NOTE - Follow Up Consult  Pharmacy Consult for heparin Indication: h/o PE/DVT  Labs: Recent Labs    11/01/17 0436  11/02/17 0552 11/02/17 1624 11/02/17 2330 11/03/17 0356  HGB 10.3*  --  9.5*  --   --  10.0*  HCT 30.2*  --  29.2*  --   --  29.2*  PLT 187  --  188  --   --  170  APTT 184*   < > 63* 81* 98* 100*  HEPARINUNFRC 1.64*  --  1.00*  --   --  0.98*  CREATININE  --   --  1.29*  --   --  1.28*   < > = values in this interval not displayed.    Assessment/Plan:  77yo male to restart heparin s/p IR procedure Heparin to restart at 1700 pm at 950 units / hr Follow up AM labs  Thank you Anette Guarneri, PharmD [phone]  11/03/2017,12:54 PM

## 2017-11-03 NOTE — Progress Notes (Signed)
PROGRESS NOTE    [name]  OVF:[id number] DOB: [birth year] DOA: 10/30/2017 PCP: Renato Shin, MD   Brief Narrative:  [name] a 77 y.o.malewith medical history significant ofchronic back pain/multiple spine surgeries, myasthenia gravis, CML, recent hospitalization with stroke, spinal stenosis, coronary artery disease, pulmonary embolism with DVT on chronic anticoagulation with Eliquis, who presented to the ER with sudden onset of sharp neck and shoulder pain since early Friday 9/20am. Underwent MRI of the cervical spine in the emergency room last night which noted -spine showed Multilevel spondylosis, with areas of significant spinal stenosis at C3-4 and C4-5 due to disc herniations. Cord compression with canal diameter as narrow as 4 mm. 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 Ronnald Ramp consulting -Pt will need to follow up with neurosurgery after discharge    History of DVT and pulmonary embolism  Hx CVA -Eliquis held, now on IV heparin   History of  myasthenia gravis/ocular -Continue Mestinon and resume prednisone - avoid exacerbating meds   History of CML -In remission, on Gleevac   History of prediabetes -Not on meds, diet controlled, presumed to be steroid-induced -On levemir 20 units daily here, follow closely   History of CAD/PAD - Remote history of CAD, on medical management due to numerous comorbidities - history of distal SFA stent in 11/2016, no longer on antiplatelet agents  History of CVA -ELiquis held now, continue IV heparin, statin  DVT prophylaxis: heparin Code Status: DNR Family Communication: wife at bedside Disposition Plan: pending eval by ID, CT surgery, ENT as well as culture results    Consultants:   ID  IR  CT surgery  ENT  Procedures:   US guided aspiration L SCM 9/25  Antimicrobials:  Anti-infectives (From admission, onward)   Start     Dose/Rate Route Frequency Ordered Stop   11/03/17 1445  vancomycin (VANCOCIN) IVPB 750 mg/150 ml premix     750 mg 150 mL/hr over 60 Minutes Intravenous Every 12 hours 11/03/17 1447     10/30/17 1945  cefTRIAXone (ROCEPHIN) 1 g in sodium chloride 0.9 % 100 mL IVPB  Status:  Discontinued     1 g 200 mL/hr over 30 Minutes Intravenous  Once 10/30/17 1941 11/01/17 1332   10/30/17 1230  ceFEPIme (MAXIPIME) 2 g in sodium chloride 0.9 % 100 mL IVPB  Status:  Discontinued     2 g 200 mL/hr over 30 Minutes Intravenous Every 24 hours 10/30/17 1146 10/31/17 1019   10/30/17 1145  vancomycin (VANCOCIN) IVPB 1000 mg/200 mL premix     1,000 mg 200 mL/hr over 60 Minutes  Intravenous  Once 10/30/17 1138 10/30/17 1303      Subjective: C/o pain to anterior chest/neck on L side.  Objective: Vitals:   11/02/17 1947 11/02/17 2336 11/03/17 0355 11/03/17 0820  BP: (!) 158/87 133/83 (!) 144/73 138/82  Pulse: (!) 115 75 81 78  Resp: 20 20 20 16   Temp: 98.2 F (36.8 C) 98.1 F (36.7 C) (!) 97.5 F (36.4 C) 98.1 F (36.7 C)  TempSrc: Oral Oral Oral Oral    SpO2: 99% 98% 99% 99%  Weight:      Height:        Intake/Output Summary (Last 24 hours) at 11/03/2017 1507 Last data filed at 11/03/2017 1208 Gross per 24 hour  Intake 381.45 ml  Output -  Net 381.45 ml   Filed Weights   10/30/17 1038 10/30/17 2230 10/31/17 1228  Weight: 79.4 kg 75.4 kg 78.2 kg    Examination:  General exam: Appears calm and comfortable  Respiratory system: Clear to auscultation. Respiratory effort normal. Cardiovascular system: S1 & S2 heard, RRR.  Gastrointestinal system: Abdomen is nondistended, soft and nontender. Central nervous system: Alert and oriented. No focal neurological deficits. Extremities: Symmetric 5 x 5 power. Skin: No rashes, lesions or ulcers MSK: ttp over L [place] joint and SCM Psychiatry: Judgement and insight appear normal. Mood & affect appropriate.     Data Reviewed: I have personally reviewed following labs and imaging studies  CBC: Recent Labs  Lab 10/30/17 0838 10/31/17 0646 11/01/17 0436 11/02/17 0552 11/03/17 0356  WBC 13.8* 15.1* 17.8* 15.1* 12.7*  NEUTROABS 10.3*  --   --   --   --   HGB 11.6* 12.7* 10.3* 9.5* 10.0*  HCT 35.1* 37.3* 30.2* 29.2* 29.2*  MCV 94.9 93.7 94.1 96.4 94.8  PLT 186 191 187 188 409   Basic Metabolic Panel: Recent Labs  Lab 10/30/17 0838 10/31/17 0646 11/02/17 0552 11/03/17 0356  NA 136 137 135 136  K 3.5 4.0 4.0 4.2  CL 101 104 103 103  CO2 25 19* 23 22  GLUCOSE 120* 183* 221* 179*  BUN 19 15 23 23   CREATININE 1.35* 1.21 1.29* 1.28*  CALCIUM 8.5* 8.1* 8.4* 8.4*   GFR: Estimated Creatinine Clearance: 43.8 mL/min (A) (by C-G formula based on SCr of 1.28 mg/dL (H)). Liver Function Tests: Recent Labs  Lab 10/31/17 0646  AST 25  ALT 37  ALKPHOS 55  BILITOT 1.4*  PROT 5.4*  ALBUMIN 2.6*   No results for input(s): LIPASE, AMYLASE in the last 168 hours. No results for input(s): AMMONIA in the last 168 hours. Coagulation Profile: No results for input(s): INR, PROTIME in the last  168 hours. Cardiac Enzymes: No results for input(s): CKTOTAL, CKMB, CKMBINDEX, TROPONINI in the last 168 hours. BNP (last 3 results) Recent Labs    10/06/17 1139  PROBNP 73.0   HbA1C: No results for input(s): HGBA1C in the last 72 hours. CBG: Recent Labs  Lab 11/02/17 0827 11/02/17 1127 11/02/17 1640 11/02/17 2155 11/03/17 0617  GLUCAP 218* 240* 136* 206* 165*   Lipid Profile: No results for input(s): CHOL, HDL, LDLCALC, TRIG, CHOLHDL, LDLDIRECT in the last 72 hours. Thyroid Function Tests: No results for input(s): TSH, T4TOTAL, FREET4, T3FREE, THYROIDAB in the last 72 hours. Anemia Panel: No results for input(s): VITAMINB12, FOLATE, FERRITIN, TIBC, IRON, RETICCTPCT in the last 72 hours. Sepsis Labs: Recent Labs  Lab 10/30/17 0849  LATICACIDVEN 1.12    Recent Results (from the past 240 hour(s))  Blood culture (routine x  2)     Status: None (Preliminary result)   Collection Time: 10/30/17  8:39 AM  Result Value Ref Range Status   Specimen Description BLOOD RIGHT ANTECUBITAL  Final   Special Requests   Final    BOTTLES DRAWN AEROBIC AND ANAEROBIC Blood Culture results may not be optimal due to an excessive volume of blood received in culture bottles   Culture   Final    NO GROWTH 4 DAYS Performed at [facility] [address], [place], [place] [address]    Report Status PENDING  Incomplete  Blood culture (routine x 2)     Status: None (Preliminary result)   Collection Time: 10/30/17  8:40 AM  Result Value Ref Range Status   Specimen Description BLOOD LEFT HAND  Final   Special Requests   Final    BOTTLES DRAWN AEROBIC AND ANAEROBIC Blood Culture adequate volume   Culture   Final    NO GROWTH 4 DAYS Performed at [facility], [place] [address], [place], [place] [address]    Report Status PENDING  Incomplete  Aerobic/Anaerobic Culture (surgical/deep wound)     Status: None (Preliminary result)   Collection Time: 11/03/17 12:18 PM  Result Value Ref  Range Status   Specimen Description ABSCESS  Final   Special Requests LEFT STERNOCLEIDOMASTOID MUSCLE  Final   Gram Stain   Final    ABUNDANT WBC PRESENT, PREDOMINANTLY PMN NO ORGANISMS SEEN Performed at [facility], [place] [address], [place], [place] [address]    Culture PENDING  Incomplete   Report Status PENDING  Incomplete         Radiology Studies: US Guided Needle Placement  Result Date: 11/03/2017 INDICATION: 77 year old male with a history of left sternocleidomastoid abscess/phlegmon related to a presumed septic left sternoclavicular joint. He presents for aspiration EXAM: IR ULTRASOUND GUIDED ASPIRATION/DRAINAGE MEDICATIONS: The patient is currently admitted to the hospital and receiving intravenous antibiotics. The antibiotics were administered within an appropriate time frame prior to the initiation of the procedure. ANESTHESIA/SEDATION: None COMPLICATIONS: None PROCEDURE: Informed written consent was obtained from the patient after a thorough discussion of the procedural risks, benefits and alternatives. All questions were addressed. Maximal Sterile Barrier Technique was utilized including caps, mask, sterile gowns, sterile gloves, sterile drape, hand hygiene and skin antiseptic. A timeout was performed prior to the initiation of the procedure. Patient positioned supine position on the ultrasound table. Ultrasound images of the left sternocleidomastoid were performed with images stored sent to PACs. Patient is prepped and draped in the usual sterile fashion. 1% lidocaine was used for local anesthesia. 18 gauge needle was advanced under ultrasound guidance into the small abscess within the muscle. Approximately 1 cc of fluid aspirated for culture. A Yueh needle/catheter was then placed under ultrasound with the catheter centered within the fluid collection. Again attempt was performed for more volume with approximately 1 cc fluid removed. Two samples were sent to the lab for  analysis. Sterile bandage was placed. Final images were stored. Patient tolerated the procedure well and remained hemodynamically stable throughout. No complications were encountered and no significant blood loss. IMPRESSION: Status post ultrasound-guided aspirate of left sternoclavicular phlegmon/abscess with small sample retrieved for diagnostic purpose. The majority was discovered to be phlegmon and not frank abscess. Signed, Dulcy Fanny. Dellia Nims, RPVI [facility] Radiology Electronically Signed   By: Corrie Mckusick D.O.   On: 11/03/2017 12:35   Mr Larose Kells XF Contrast  Result Date: 11/02/2017 CLINICAL DATA:  77 year old male presents with left neck pain, infection suspected. Attention left sternoclavicular joint. EXAM: MR STERNUM WITHOUT AND WITH CONTRAST TECHNIQUE: Multiplanar MR imaging of the sternum without and with IV contrast. CONTRAST:  7 mL Gadavist IV COMPARISON:  None. FINDINGS: Abnormal enhancing fluid collection centered within the left sternocleidomastoid muscle measuring at least 6.3 x 1.2 x 2 cm is identified contiguous with the left sternoclavicular joint and joint fluid. Findings are in keeping with pyomyositis of the sternocleidomastoid. Given that this collection is contiguous with the left sternoclavicular joint and joint fluid, the possibility of a septic joint is raised. No definite evidence of osteomyelitis is noted of the sternoclavicular joints. Symmetric subchondral cystic change likely degenerative in etiology is currently noted. No apparent cervical lymphadenopathy the included cervical spine. IMPRESSION: 1. Abnormal enhancing serpiginous fluid measuring 6.3 x 1.2 x 2 cm tracking within the left sternocleidomastoid muscle and contiguous with the left sternoclavicular joint is noted. Findings raise concern for pyomyositis possibly from septic arthritis of the left sternoclavicular joint. 2. 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 No frank bone destruction or marrow signal abnormalities definitive for osteomyelitis. Electronically Signed   By: Ashley Royalty M.D.   On: 11/02/2017 17:49        Scheduled Meds: . brimonidine  1 drop Both Eyes Q12H  . cholecalciferol  1,000 Units Oral Daily  . famotidine  20 mg Oral BID  . furosemide  20 mg Oral Daily  . hydrALAZINE  10 mg Intravenous Once  . imatinib  300 mg Oral Q1200  . insulin aspart  0-9 Units Subcutaneous TID WC  . insulin detemir  20 Units Subcutaneous Daily  . isosorbide mononitrate  30 mg Oral Daily  . lidocaine (PF)      . lisinopril  10 mg Oral Daily  . multivitamin with minerals  1 tablet Oral Daily  . predniSONE  3 mg Oral Q breakfast  . pyridostigmine  60 mg Oral Daily  . rosuvastatin  5 mg Oral Daily  . timolol  1 drop Both Eyes BID   Continuous Infusions: . heparin    . vancomycin       LOS: 4 days    Time spent: over 30 min    Fayrene Helper, MD [facility] Pager [phone]  If 7PM-7AM, please contact night-coverage [email or web address] Password [facility] 11/03/2017, 3:07 PM

## 2017-11-03 NOTE — Consult Note (Signed)
[facility] for Infectious Disease    Date of Admission:  10/30/2017     Total days of antibiotics                Reason for Consult: Septic Arthritis   Referring Provider: Dr. Florene Glen Primary Care Provider: Renato Shin, MD   Assessment/Plan:  [name] is a 77 y/o male with previous history of CML in remission, Myasthenia Gravis, and controlled Type 2 diabetes who presents with acute sternal/left shoulder pain with concern for septic arthritis/polymyositis. IR was able to collect a culture which is in process. He was started on vancomycin for broad spectrum coverage pending the culture results. Fortunately he has been afebrile since admission with no systemic  symptoms.  1. Continue current dosage of vancomycin. Start ceftriaxone in addition pending culture results. 2. Recommend evaluation by CVTS for role of any possible surgical intervention.  3. Cervical stenosis with surgical intervention planned pending clearing of infection.    Principal Problem:   Septic arthritis of left sternoclavicular joint (HCC) Active Problems:   Chronic myeloid leukemia in remission (HCC)   HYPERLIPIDEMIA, MIXED   Essential hypertension   CAD (coronary artery disease)   Diabetes mellitus type 2, controlled ([place])   History of pulmonary embolism   Long term (current) use of anticoagulants   GERD with stricture   Myasthenia gravis ([place])   Cervical spinal cord compression ([place])   . brimonidine  1 drop Both Eyes Q12H  . cholecalciferol  1,000 Units Oral Daily  . famotidine  20 mg Oral BID  . furosemide  20 mg Oral Daily  . hydrALAZINE  10 mg Intravenous Once  . imatinib  300 mg Oral Q1200  . insulin aspart  0-9 Units Subcutaneous TID WC  . insulin detemir  20 Units Subcutaneous Daily  . isosorbide mononitrate  30 mg Oral Daily  . lidocaine (PF)      . lisinopril  10 mg Oral Daily  . multivitamin with minerals  1 tablet Oral Daily  . predniSONE  3 mg Oral Q breakfast  .  pyridostigmine  60 mg Oral Daily  . rosuvastatin  5 mg Oral Daily  . timolol  1 drop Both Eyes BID     HPI: [name] is a 77 y.o. male with previous medical history as listed below who was admitted to the hospital with the chief complaint of neck pain started about 24 hours prior to presentation when he was sitting and expierenced a sharp pain on the left side of his neck and shoulder.   [name] had a temperature of 100.1 with mild tachycardia in the ED. Lab work with leukocytosis of 13.8; mild anemia of 11.6; CRP of 3.2 and ESR of 28. CT scan of the cervical spine with no acute abnormality and diffuse degenerative disc cisease with severe stenosis at C3-4 and C4-5. MRI of the sternum with serpiginous fluid collection measuring 6.3 x 1.2 x 2 cm within the left steronclavicular joint with concern for polymyositis. Neurosurgery consulted with need for ACDF of C3-4 and corpectomy of C5 once medically optimized. Interventional radiology performed aspiration with cultures being obtained and pending. He was started on vancomycin post-procedure. Blood cultures drawn in the ED are without growth to date.   [name] has no previous history of injury or trauma to his left shoulder, sternum or clavicle. Reports sitting in the chair using his iPad when the pain started. It progressively worsened to the point that his  wife was not able to touch his arm and he was brought to the ED. He continues to have pain located in the left sternoclavicular joint that is worsened with movement. He has been afebrile since his initial low grade temperature in the ED.    Review of Systems: Review of Systems  Constitutional: Negative for chills, fever and malaise/fatigue.  Respiratory: Negative for cough, shortness of breath and wheezing.   Cardiovascular: Negative for chest pain.  Gastrointestinal: Negative for abdominal pain, constipation, diarrhea, nausea and vomiting.  Genitourinary: Negative for dysuria,  frequency, hematuria and urgency.  Skin: Negative for rash.     Past Medical History:  Diagnosis Date  . Bruises easily    d/t being on Eliquis  . Carotid artery disease ([place])    a. Duplex 05/2014: 83-41% RICA, 9-62% LICA, elevated velocities in right subclavian artery, normal left subclavian artery..  . Chronic back pain    HNP  . Chronic kidney disease (CKD)   . Claustrophobia    takes Ativan as needed  . CML (chronic myeloid leukemia) ([place])    takes Gleevec daily  . Coronary atherosclerosis of unspecified type of vessel, native or graft    a. cath in 2003 showed 10-20% LM, scattered 20% prox-mid LAD, 30% more distal LAD, 50-60% stenosis of LAD towards apex, 20% Cx, 30% dRCA, focal 95% stenosis in smaller of 2 branches of PDA, EF 60-65%.  . Diabetes mellitus ([place])   . Diarrhea    takes Imodium daily as needed  . Diverticulosis   . Diverticulosis of colon (without mention of hemorrhage)   . DJD (degenerative joint disease)   . DVT (deep venous thrombosis) ([place]) 07/2011  . Esophageal stricture   . Essential hypertension    takes Imdur and Lisinopril daily  . Gastric polyps    benign  . GERD (gastroesophageal reflux disease)    takes Nexium daily  . Glaucoma    uses eye drops  . History of bronchitis    not sure when the last time  . History of colon polyps    benign  . History of kidney stones   . History of kidney stones   . Insomnia    takes Melatonin nightly as needed  . Mixed hyperlipidemia    takes Crestor daily  . Myasthenia gravis ([place])    uses prednisone  . Osteoporosis 2016  . Peripheral edema    takes Lasix daily  . Pituitary tumor    checks [facility]Walden at [facility] checks it every Jan   . Pneumonia 01/2016  . PONV (postoperative nausea and vomiting)   . Ptosis   . Pulmonary embolus ([place]) 2012  . Stroke [facility] 06/2015    Social History   Tobacco Use  . Smoking status: Never Smoker  . Smokeless tobacco: Never Used  Substance Use Topics  . Alcohol  use: No    Alcohol/week: 0.0 standard drinks  . Drug use: No    Family History  Problem Relation Age of Onset  . Heart disease Mother   . Heart attack Mother   . Stroke Mother   . Heart disease Father   . Heart attack Father   . Stroke Father   . Breast cancer Sister        Twin   . Heart attack Sister   . Dementia Sister   . Heart disease Sister   . Heart attack Sister   . Clotting disorder Sister   . Heart attack Sister   . Colon  cancer Neg Hx     Allergies  Allergen Reactions  . Phenergan [Promethazine Hcl] Swelling and Other (See Comments)    Cannot be combined with Zofran because swelling of the eyes resulted and patient became very restless-  . Zofran [Ondansetron Hcl] Swelling and Other (See Comments)    Cannot be combined with Phenergan because swelling of the eyes resulted and patient became very restless-  . Sulfamethoxazole Rash and Itching    OBJECTIVE: Blood pressure 138/82, pulse 78, temperature 98.1 F (36.7 C), temperature source Oral, resp. rate 16, height 5' 7"  (1.702 m), weight 78.2 kg, SpO2 99 %.  Physical Exam  Constitutional: He is oriented to person, place, and time. He appears well-developed and well-nourished. No distress.  Seated in the chair, appears slightly uncomfortable; pleasant.   Cardiovascular: Normal rate, regular rhythm, normal heart sounds and intact distal pulses.  Pulmonary/Chest: Effort normal and breath sounds normal.  Musculoskeletal:  Left sternoclavicular joint is tender to the touch and pain is exacerbated with any movement of the left upper extremity. Distal pulses and sensation are intact and appropriate.   Neurological: He is alert and oriented to person, place, and time.  Skin: Skin is warm and dry.  Psychiatric: He has a normal mood and affect. His behavior is normal. Judgment and thought content normal.    Lab Results Lab Results  Component Value Date   WBC 12.7 (H) 11/03/2017   HGB 10.0 (L) 11/03/2017   HCT 29.2  (L) 11/03/2017   MCV 94.8 11/03/2017   PLT 170 11/03/2017    Lab Results  Component Value Date   CREATININE 1.28 (H) 11/03/2017   BUN 23 11/03/2017   NA 136 11/03/2017   K 4.2 11/03/2017   CL 103 11/03/2017   CO2 22 11/03/2017    Lab Results  Component Value Date   ALT 37 10/31/2017   AST 25 10/31/2017   ALKPHOS 55 10/31/2017   BILITOT 1.4 (H) 10/31/2017     Microbiology: Recent Results (from the past 240 hour(s))  Blood culture (routine x 2)     Status: None (Preliminary result)   Collection Time: 10/30/17  8:39 AM  Result Value Ref Range Status   Specimen Description BLOOD RIGHT ANTECUBITAL  Final   Special Requests   Final    BOTTLES DRAWN AEROBIC AND ANAEROBIC Blood Culture results may not be optimal due to an excessive volume of blood received in culture bottles   Culture   Final    NO GROWTH 4 DAYS Performed at [facility], [place] [address] [place], [place] [address]    Report Status PENDING  Incomplete  Blood culture (routine x 2)     Status: None (Preliminary result)   Collection Time: 10/30/17  8:40 AM  Result Value Ref Range Status   Specimen Description BLOOD LEFT HAND  Final   Special Requests   Final    BOTTLES DRAWN AEROBIC AND ANAEROBIC Blood Culture adequate volume   Culture   Final    NO GROWTH 4 DAYS Performed at [facility], [place] [address], [place], [place] [address]    Report Status PENDING  Incomplete  Aerobic/Anaerobic Culture (surgical/deep wound)     Status: None (Preliminary result)   Collection Time: 11/03/17 12:18 PM  Result Value Ref Range Status   Specimen Description ABSCESS  Final   Special Requests LEFT STERNOCLEIDOMASTOID MUSCLE  Final   Gram Stain   Final    ABUNDANT WBC PRESENT, PREDOMINANTLY PMN NO  ORGANISMS SEEN Performed at [facility], [place] [address], [place], [place] [address]    Culture PENDING  Incomplete   Report Status PENDING  Incomplete     Terri Piedra, NP [facility] [phone] Pager  11/03/2017  3:14 PM

## 2017-11-03 NOTE — Progress Notes (Signed)
[facility] Infusion Coordinator will follow pt with ID team to support [facility] services for home IV ABX if needed at DC.  If patient discharges after hours, please call [phone].   [name] 11/03/2017, 5:13 PM

## 2017-11-03 NOTE — Consult Note (Addendum)
Pharmacy Antibiotic Note  [name] is a 77 y.o. male admitted on 10/30/2017 with SCM abscess.  Pharmacy has been consulted for Vancomycin dosing.  Plan: Vancomycin 750 IV every 12 hours.  Goal trough 15-20 mcg/mL.  Height: 5\' 7"  (170.2 cm) Weight: 172 lb 6.4 oz (78.2 kg) IBW/kg (Calculated) : 66.1  Temp (24hrs), Avg:98 F (36.7 C), Min:97.5 F (36.4 C), Max:98.2 F (36.8 C)  Recent Labs  Lab 10/30/17 0838 10/30/17 0849 10/31/17 0646 11/01/17 0436 11/02/17 0552 11/03/17 0356  WBC 13.8*  --  15.1* 17.8* 15.1* 12.7*  CREATININE 1.35*  --  1.21  --  1.29* 1.28*  LATICACIDVEN  --  1.12  --   --   --   --     Estimated Creatinine Clearance: 43.8 mL/min (A) (by C-G formula based on SCr of 1.28 mg/dL (H)).    Allergies  Allergen Reactions  . Phenergan [Promethazine Hcl] Swelling and Other (See Comments)    Cannot be combined with Zofran because swelling of the eyes resulted and patient became very restless-  . Zofran [Ondansetron Hcl] Swelling and Other (See Comments)    Cannot be combined with Phenergan because swelling of the eyes resulted and patient became very restless-  . Sulfamethoxazole Rash and Itching    Antimicrobials this admission: Cefepime 9/21 >> 9/21 Vancomycin 9/25 >>    *Rec'd 1 dose 9/21   Microbiology results: 9/21 BCx: neg to date 9/25 Abscess culture: pending  Thank you for allowing pharmacy to be a part of this patient's care.  Latanya Maudlin, PharmD Student 11/03/2017 2:37 PM

## 2017-11-03 NOTE — Progress Notes (Signed)
[facility] aware of this inpatient consult for [facility] joint abscess.  I notified Dr. Servando Snare and he reviewed MRI.  Nothing to do at this time and we will await blood culture results and IR aspiration results.  Pending that, we will make further decision.  Dr. Florene Glen made aware.

## 2017-11-03 NOTE — Progress Notes (Signed)
ANTICOAGULATION CONSULT NOTE - Follow Up Consult  Pharmacy Consult for heparin Indication: h/o PE/DVT  Labs: Recent Labs    10/31/17 0646  10/31/17 1005  11/01/17 0436  11/02/17 0552 11/02/17 1624 11/02/17 2330  HGB 12.7*  --   --   --  10.3*  --  9.5*  --   --   HCT 37.3*  --   --   --  30.2*  --  29.2*  --   --   PLT 191  --   --   --  187  --  188  --   --   APTT >200*  --  >200*   < > 184*   < > 63* 81* 98*  HEPARINUNFRC  --    < > >2.20*  --  1.64*  --  1.00*  --   --   CREATININE 1.21  --   --   --   --   --  1.29*  --   --    < > = values in this interval not displayed.    Assessment/Plan:  77yo male remains therapeutic on heparin. Will continue gtt at current rate and monitor daily labs.   Wynona Neat, PharmD, BCPS  11/03/2017,12:34 AM

## 2017-11-03 NOTE — Procedures (Signed)
Interventional Radiology Procedure Note  Procedure: US guided aspirate of left SCM abscess/phlegmon.  Small sample acquired for dx purpose. Mostly phlegmon encountered Complications: None Recommendations:  - Ok to shower tomorrow - Do not submerge for 7 days - Routine care   Signed,  Dulcy Fanny. Earleen Newport, DO

## 2017-11-03 NOTE — Progress Notes (Signed)
Patient ID: [name], male   DOB: [birth year], 77 y.o.   MRN: [id number] Pt seen and examined. MRI shows expected changes c/w infection around SCM/ [place] jt. rec IR vs ortjho vs CT surgery to get culture of area. Obviously, I cannot operate on him with active infection, so surgery must be delayed until after infection cleared. Have him see me in the office 2 wks after D/c.

## 2017-11-03 NOTE — Consult Note (Addendum)
   [facility] CM Inpatient Consult   11/03/2017  [name] October 12, 1940 [id number]   Patient screened for potential [facility] Care Management services due to unplanned readmission risk score 39% (extreme) and multiple hospitalizations.  Went to bedside to speak with [name] and wife about [facility] Care Management program. Mrs.  Beckles declines stating "he does not need it".   Accepted [facility] Care Management brochure with contact information to call should they change their mind.  Made inpatient RNCM aware [facility] services were declined.   Marthenia Rolling, MSN-Ed, RN,BSN [facility] Liaison [phone]

## 2017-11-04 DIAGNOSIS — M009 Pyogenic arthritis, unspecified: Principal | ICD-10-CM

## 2017-11-04 DIAGNOSIS — R072 Precordial pain: Secondary | ICD-10-CM

## 2017-11-04 LAB — GLUCOSE, CAPILLARY
GLUCOSE-CAPILLARY: 159 mg/dL — AB (ref 70–99)
GLUCOSE-CAPILLARY: 181 mg/dL — AB (ref 70–99)
GLUCOSE-CAPILLARY: 190 mg/dL — AB (ref 70–99)
Glucose-Capillary: 168 mg/dL — ABNORMAL HIGH (ref 70–99)

## 2017-11-04 LAB — BASIC METABOLIC PANEL
Anion gap: 11 (ref 5–15)
BUN: 22 mg/dL (ref 8–23)
CHLORIDE: 101 mmol/L (ref 98–111)
CO2: 24 mmol/L (ref 22–32)
Calcium: 8.5 mg/dL — ABNORMAL LOW (ref 8.9–10.3)
Creatinine, Ser: 1.33 mg/dL — ABNORMAL HIGH (ref 0.61–1.24)
GFR calc non Af Amer: 49 mL/min — ABNORMAL LOW (ref >60)
GFR, EST AFRICAN AMERICAN: 57 mL/min — AB (ref >60)
Glucose, Bld: 163 mg/dL — ABNORMAL HIGH (ref 70–99)
POTASSIUM: 3.9 mmol/L (ref 3.5–5.1)
Sodium: 136 mmol/L (ref 135–145)

## 2017-11-04 LAB — HEPARIN LEVEL (UNFRACTIONATED): Heparin Unfractionated: 0.97 IU/mL — ABNORMAL HIGH (ref 0.30–0.70)

## 2017-11-04 LAB — CBC
HEMATOCRIT: 31.5 % — AB (ref 39.0–52.0)
HEMOGLOBIN: 10.7 g/dL — AB (ref 13.0–17.0)
MCH: 31.7 pg (ref 26.0–34.0)
MCHC: 34 g/dL (ref 30.0–36.0)
MCV: 93.2 fL (ref 78.0–100.0)
Platelets: 171 10*3/uL (ref 150–400)
RBC: 3.38 MIL/uL — AB (ref 4.22–5.81)
RDW: 14 % (ref 11.5–15.5)
WBC: 12.7 10*3/uL — ABNORMAL HIGH (ref 4.0–10.5)

## 2017-11-04 LAB — MAGNESIUM: MAGNESIUM: 2.1 mg/dL (ref 1.7–2.4)

## 2017-11-04 LAB — CULTURE, BLOOD (ROUTINE X 2)
Culture: NO GROWTH
Culture: NO GROWTH
Special Requests: ADEQUATE

## 2017-11-04 LAB — APTT: aPTT: 95 seconds — ABNORMAL HIGH (ref 24–36)

## 2017-11-04 MED ORDER — SODIUM CHLORIDE 0.9 % IV SOLN
2.0000 g | INTRAVENOUS | Status: DC
  Administered 2017-11-04 – 2017-11-06 (×3): 2 g via INTRAVENOUS
  Filled 2017-11-04 (×4): qty 20

## 2017-11-04 MED ORDER — MUPIROCIN 2 % EX OINT
TOPICAL_OINTMENT | Freq: Two times a day (BID) | CUTANEOUS | Status: DC
  Administered 2017-11-04 – 2017-11-06 (×5): via NASAL
  Filled 2017-11-04: qty 22

## 2017-11-04 NOTE — Evaluation (Signed)
Physical Therapy Evaluation Patient Details Name: [name] MRN: [id number] DOB: 01/24/1941 Today's Date: 11/04/2017   History of Present Illness  Pt is a 77 y.o. male with PMH of chronic back pain/multiple spine sxs, myasthenia gravis, recent hospitalization with CVA, spinal stenosis, CAD, PE/DVT on chronic anticoagulation with Eliquis, admitted 10/30/17 with sudden onset of sharp neck and shoulder pain (occurring since early Friday 9/20). MRI showing cervical stenosis at C3-4 and disc herniations at C4-5. S/p guided aspiration of left SCM on 11/03/17, cultures pending.     Clinical Impression  Pt presents with an overall decrease in functional mobility secondary to above. PTA, pt mod indep with RW; lives with wife who intermittently assists with ADLs. At baseline, pt's mobility limited by significant back/neck pain. Today, pt required min-modA for transfers and ambulation. Discussed recommendation for SNF-level therapies to maximize functional mobility and independence prior to return home; pt and wife in agreement. Will follow acutely to address established goals.   Follow Up Recommendations SNF;Supervision for mobility/OOB    Equipment Recommendations  None recommended by PT    Recommendations for Other Services       Precautions / Restrictions Precautions Precautions: Fall Restrictions Weight Bearing Restrictions: No      Mobility  Bed Mobility               General bed mobility comments: seated in chair at sink upon entry  Transfers Overall transfer level: Needs assistance Equipment used: Rolling walker (2 wheeled) Transfers: Sit to/from Stand Sit to Stand: Min assist;Mod assist         General transfer comment: mod assist initally, cueing for hand placement and safety fading to min assist. Cues to push through RUE since pt attempting to use LUE despite painful  Ambulation/Gait Ambulation/Gait assistance: Min assist Gait Distance (Feet): (hallway) Assistive  device: Rolling walker (2 wheeled) Gait Pattern/deviations: Step-to pattern;Shuffle;Trunk flexed;Decreased weight shift to left Gait velocity: Decreased Gait velocity interpretation: <1.8 ft/sec, indicate of risk for recurrent falls General Gait Details: Slow, shuffling amb with RW and min guard, requiring minA to correct 2x LOB. Pt c/o LLE fatigue  Stairs            Wheelchair Mobility    Modified Rankin (Stroke Patients Only)       Balance Overall balance assessment: Needs assistance Sitting-balance support: No upper extremity supported;Feet supported Sitting balance-Leahy Scale: Fair     Standing balance support: Bilateral upper extremity supported;During functional activity Standing balance-Leahy Scale: Poor Standing balance comment: relaint on B UE support                             Pertinent Vitals/Pain Pain Assessment: Faces Faces Pain Scale: Hurts even more Pain Location: L shoulder with movement and touch  Pain Descriptors / Indicators: Sharp;Grimacing;Guarding Pain Intervention(s): Monitored during session;Limited activity within patient's tolerance    Home Living Family/patient expects to be discharged to:: Private residence Living Arrangements: Spouse/significant other Available Help at Discharge: Family;Available 24 hours/day Type of Home: House Home Access: Stairs to enter Entrance Stairs-Rails: Left Entrance Stairs-Number of Steps: 3 from porch, then 9 to first floor  Home Layout: One level Home Equipment: Shower seat - built in;Walker - 2 wheels;Cane - single point      Prior Function Level of Independence: Needs assistance   Gait / Transfers Assistance Needed: Mod indep with SPC or RW; does not always use DME in house  ADL's / Homemaking Assistance Needed:  spouse reports patient sometimes needing assistance for LB dressing         Hand Dominance   Dominant Hand: Right    Extremity/Trunk Assessment   Upper Extremity  Assessment Upper Extremity Assessment: Generalized weakness;LUE deficits/detail LUE Deficits / Details: limited shoulder flexion to 30 degrees due to pain with noted hypersensitivity shoulder area, WFL elbow and distal  LUE: Unable to fully assess due to pain LUE Sensation: (hypersensitivity at shoulder/chest area) LUE Coordination: decreased gross motor    Lower Extremity Assessment Lower Extremity Assessment: Generalized weakness;RLE deficits/detail;LLE deficits/detail RLE Deficits / Details: Grossly 4/5 throughout LLE Deficits / Details: Pt reports back pain into L hip; L knee ext 3+/5, hip flex 3/5 LLE Coordination: decreased gross motor    Cervical / Trunk Assessment Cervical / Trunk Assessment: Other exceptions Cervical / Trunk Exceptions: hx cervical stenosis and multiple spinal surgeries  Communication   Communication: HOH(bilateral hearing aids)  Cognition Arousal/Alertness: Awake/alert Behavior During Therapy: WFL for tasks assessed/performed Overall Cognitive Status: Within Functional Limits for tasks assessed                                        General Comments General comments (skin integrity, edema, etc.): Wife present and supportive    Exercises Other Exercises Other Exercises: educated on AROM of L UE, progressive ROM at shoulder in order to increase ROM gradually within pain tolerance    Assessment/Plan    PT Assessment Patient needs continued PT services  PT Problem List Decreased strength;Decreased range of motion;Decreased activity tolerance;Decreased balance;Decreased mobility;Decreased knowledge of use of DME;Decreased knowledge of precautions;Pain       PT Treatment Interventions DME instruction;Gait training;Stair training;Functional mobility training;Therapeutic activities;Therapeutic exercise;Balance training;Patient/family education    PT Goals (Current goals can be found in the Care Plan section)  Acute Rehab PT Goals Patient  Stated Goal: to have less pain  PT Goal Formulation: With patient Time For Goal Achievement: 11/18/17 Potential to Achieve Goals: Fair    Frequency Min 2X/week   Barriers to discharge Decreased caregiver support Wife not able to provide physical assist with mobility    Co-evaluation PT/OT/SLP Co-Evaluation/Treatment: Yes Reason for Co-Treatment: Complexity of the patient's impairments (multi-system involvement);To address functional/ADL transfers PT goals addressed during session: Mobility/safety with mobility;Balance;Proper use of DME OT goals addressed during session: ADL's and self-care;Other (comment)(mobility)       AM-PAC PT "6 Clicks" Daily Activity  Outcome Measure Difficulty turning over in bed (including adjusting bedclothes, sheets and blankets)?: Unable Difficulty moving from lying on back to sitting on the side of the bed? : Unable Difficulty sitting down on and standing up from a chair with arms (e.g., wheelchair, bedside commode, etc,.)?: Unable Help needed moving to and from a bed to chair (including a wheelchair)?: A Little Help needed walking in hospital room?: A Little Help needed climbing 3-5 steps with a railing? : A Lot 6 Click Score: 11    End of Session Equipment Utilized During Treatment: Gait belt Activity Tolerance: Patient tolerated treatment well;Patient limited by fatigue Patient left: in bed;with call bell/phone within reach;with family/visitor present Nurse Communication: Mobility status PT Visit Diagnosis: Other abnormalities of gait and mobility (R26.89)    Time: 6948-5462 PT Time Calculation (min) (ACUTE ONLY): 21 min   Charges:   PT Evaluation $PT Eval Moderate Complexity: [place], PT, DPT Acute  Rehabilitation Services  Pager [phone] Office [place] 11/04/2017, 1:08 PM

## 2017-11-04 NOTE — Progress Notes (Addendum)
PROGRESS NOTE    [name]  KKX:[id number] DOB: 02-23-1940 DOA: 10/30/2017 PCP: Renato Shin, MD   Brief Narrative:  [name] [name] 77 y.o.malewith medical history significant ofchronic back pain/multiple spine surgeries, myasthenia gravis, CML, recent hospitalization with stroke, spinal stenosis, coronary artery disease, pulmonary embolism with DVT on chronic anticoagulation with Eliquis, who presented to the ER with sudden onset of sharp neck and shoulder pain since early Friday 9/20am. Underwent MRI of the cervical spine in the emergency room last night which noted -spine showed Multilevel spondylosis, with areas of significant spinal stenosis at C3-4 and C4-5 due to disc herniations. Cord compression with canal diameter as narrow as 4 mm. 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 Ronnald Ramp  consulting -Pt will need to follow up with neurosurgery after discharge    History of DVT and pulmonary embolism  Hx CVA -Eliquis held, now on IV heparin (will continue heparin for now, but will discuss plan with surgery to help with timing of restarting eliquis)   History of myasthenia gravis/ocular -Continue Mestinon and resume prednisone - avoid exacerbating meds   History of CML -In remission, on Gleevac   History of prediabetes -Not on meds, diet controlled, presumed to be steroid-induced -On levemir 20 units daily here, follow closely   History of CAD/PAD - Remote history of CAD, on medical management due to numerous comorbidities - history of distal SFA stent in 11/2016, no longer on antiplatelet agents  History of CVA -ELiquis held now, continue IV heparin, statin  DVT prophylaxis: heparin Code Status: DNR Family Communication: wife at bedside Disposition Plan: pending eval by ID, CT surgery, ENT as well as culture results    Consultants:   ID  IR  CT surgery  ENT  Procedures:   US guided aspiration L SCM 9/25  Antimicrobials:  Anti-infectives (From admission, onward)   Start     Dose/Rate Route Frequency Ordered Stop   11/04/17 0900  cefTRIAXone (ROCEPHIN) 2 g in sodium chloride 0.9 % 100 mL IVPB     2 g 200 mL/hr over 30 Minutes Intravenous Every 24 hours 11/04/17 0759     11/03/17 1445  vancomycin (VANCOCIN) IVPB 750 mg/150 ml premix     750 mg 150 mL/hr over 60 Minutes Intravenous Every 12 hours 11/03/17 1447     10/30/17 1945  cefTRIAXone (ROCEPHIN) 1 g in sodium chloride 0.9 % 100 mL IVPB  Status:  Discontinued     1 g  200 mL/hr over 30 Minutes Intravenous  Once 10/30/17 1941 11/01/17 1332   10/30/17 1230  ceFEPIme (MAXIPIME) 2 g in sodium chloride 0.9 % 100 mL IVPB  Status:  Discontinued     2 g 200 mL/hr over 30 Minutes Intravenous Every 24 hours 10/30/17 1146 10/31/17 1019   10/30/17 1145  vancomycin (VANCOCIN) IVPB 1000 mg/200 mL  premix     1,000 mg 200 mL/hr over 60 Minutes Intravenous  Once 10/30/17 1138 10/30/17 1303      Subjective: Pain is better.  Movement of L arm is better.  Objective: Vitals:   11/03/17 1600 11/03/17 2009 11/03/17 2353 11/04/17 0332  BP: (!) 161/87 (!) 148/78 (!) 144/71 139/77  Pulse: 98 96 91 89  Resp: 18 18 18 18   Temp: 98.3 F (36.8 C) 97.9 F (36.6 C) 98 F (36.7 C) 98.3 F (36.8 C)  TempSrc: Oral Oral Oral Oral  SpO2: 97% 99% 98% 98%  Weight:      Height:        Intake/Output Summary (Last 24 hours) at 11/04/2017 1010 Last data filed at 11/03/2017 1208 Gross per 24 hour  Intake 381.45 ml  Output -  Net 381.45 ml   Filed Weights   10/30/17 1038 10/30/17 2230 10/31/17 1228  Weight: 79.4 kg 75.4 kg 78.2 kg    Examination:  General: No acute distress. Cardiovascular: Heart sounds show Monty Spicher regular rate, and rhythm Lungs: Clear to auscultation bilaterally  Abdomen: Soft, nontender, nondistended Neurological: Alert and oriented 3. Moves all extremities 4. Cranial nerves II through XII grossly intact. MSK: ttp over L SCM and [place] joint Skin: Warm and dry. No rashes or lesions. Extremities: 1+ edema bilaterally, L>R which is chronic Psychiatric: Mood and affect are normal. Insight and judgment are appropriate.    Data Reviewed: I have personally reviewed following labs and imaging studies  CBC: Recent Labs  Lab 10/30/17 0838 10/31/17 0646 11/01/17 0436 11/02/17 0552 11/03/17 0356 11/04/17 0557  WBC 13.8* 15.1* 17.8* 15.1* 12.7* 12.7*  NEUTROABS 10.3*  --   --   --   --   --   HGB 11.6* 12.7* 10.3* 9.5* 10.0* 10.7*  HCT 35.1* 37.3* 30.2* 29.2* 29.2* 31.5*  MCV 94.9 93.7 94.1 96.4 94.8 93.2  PLT 186 191 187 188 170 283   Basic Metabolic Panel: Recent Labs  Lab 10/30/17 0838 10/31/17 0646 11/02/17 0552 11/03/17 0356 11/04/17 0557  NA 136 137 135 136 136  K 3.5 4.0 4.0 4.2 3.9  CL 101 104 103 103 101  CO2 25 19* 23 22 24   GLUCOSE 120* 183* 221*  179* 163*  BUN 19 15 23 23 22   CREATININE 1.35* 1.21 1.29* 1.28* 1.33*  CALCIUM 8.5* 8.1* 8.4* 8.4* 8.5*  MG  --   --   --   --  2.1   GFR: Estimated Creatinine Clearance: 42.1 mL/min (Amery Vandenbos) (by C-G formula based on SCr of 1.33 mg/dL (H)). Liver Function Tests: Recent Labs  Lab 10/31/17 0646  AST 25  ALT 37  ALKPHOS 55  BILITOT 1.4*  PROT 5.4*  ALBUMIN 2.6*   No results for input(s): LIPASE, AMYLASE in the last 168 hours. No results for input(s): AMMONIA in the last 168 hours. Coagulation Profile: No results for input(s): INR, PROTIME in the last 168 hours. Cardiac Enzymes: No results for input(s): CKTOTAL, CKMB, CKMBINDEX, TROPONINI in the last 168 hours. BNP (last 3 results) Recent Labs    10/06/17 1139  PROBNP 73.0  HbA1C: No results for input(s): HGBA1C in the last 72 hours. CBG: Recent Labs  Lab 11/03/17 0617 11/03/17 1658 11/03/17 2150 11/03/17 2228 11/04/17 0609  GLUCAP 165* 258* 219* 211* 159*   Lipid Profile: No results for input(s): CHOL, HDL, LDLCALC, TRIG, CHOLHDL, LDLDIRECT in the last 72 hours. Thyroid Function Tests: No results for input(s): TSH, T4TOTAL, FREET4, T3FREE, THYROIDAB in the last 72 hours. Anemia Panel: No results for input(s): VITAMINB12, FOLATE, FERRITIN, TIBC, IRON, RETICCTPCT in the last 72 hours. Sepsis Labs: Recent Labs  Lab 10/30/17 0849  LATICACIDVEN 1.12    Recent Results (from the past 240 hour(s))  Blood culture (routine x 2)     Status: None   Collection Time: 10/30/17  8:39 AM  Result Value Ref Range Status   Specimen Description BLOOD RIGHT ANTECUBITAL  Final   Special Requests   Final    BOTTLES DRAWN AEROBIC AND ANAEROBIC Blood Culture results may not be optimal due to an excessive volume of blood received in culture bottles   Culture   Final    NO GROWTH 5 DAYS Performed at [facility], [place] [address], [place], [place] [address]    Report Status 11/04/2017 FINAL  Final  Blood culture (routine x 2)      Status: None   Collection Time: 10/30/17  8:40 AM  Result Value Ref Range Status   Specimen Description BLOOD LEFT HAND  Final   Special Requests   Final    BOTTLES DRAWN AEROBIC AND ANAEROBIC Blood Culture adequate volume   Culture   Final    NO GROWTH 5 DAYS Performed at [facility], [place] [address] [place], [place] [address]    Report Status 11/04/2017 FINAL  Final  Aerobic/Anaerobic Culture (surgical/deep wound)     Status: None (Preliminary result)   Collection Time: 11/03/17 12:18 PM  Result Value Ref Range Status   Specimen Description ABSCESS  Final   Special Requests LEFT STERNOCLEIDOMASTOID MUSCLE  Final   Gram Stain   Final    ABUNDANT WBC PRESENT, PREDOMINANTLY PMN NO ORGANISMS SEEN    Culture   Final    CULTURE REINCUBATED FOR BETTER GROWTH Performed at [facility], [place] [address] [place], [place] [address]    Report Status PENDING  Incomplete         Radiology Studies: US Guided Needle Placement  Result Date: 11/03/2017 INDICATION: 77 year old male with Tumeka Chimenti history of left sternocleidomastoid abscess/phlegmon related to Olaoluwa Grieder presumed septic left sternoclavicular joint. He presents for aspiration EXAM: IR ULTRASOUND GUIDED ASPIRATION/DRAINAGE MEDICATIONS: The patient is currently admitted to the hospital and receiving intravenous antibiotics. The antibiotics were administered within an appropriate time frame prior to the initiation of the procedure. ANESTHESIA/SEDATION: None COMPLICATIONS: None PROCEDURE: Informed written consent was obtained from the patient after [place] thorough discussion of the procedural risks, benefits and alternatives. All questions were addressed. Maximal Sterile Barrier Technique was utilized including caps, mask, sterile gowns, sterile gloves, sterile drape, hand hygiene and skin antiseptic. Ilia Dimaano timeout was performed prior to the initiation of the procedure. Patient positioned supine position on the ultrasound table. Ultrasound images of  the left sternocleidomastoid were performed with images stored sent to PACs. Patient is prepped and draped in the usual sterile fashion. 1% lidocaine was used for local anesthesia. 18 gauge needle was advanced under ultrasound guidance into the small abscess within the muscle. Approximately 1 cc of fluid aspirated for culture. Trisa Cranor Yueh needle/catheter was then placed under ultrasound  with the catheter centered within the fluid collection. Again attempt was performed for more volume with approximately 1 cc fluid removed. Two samples were sent to the lab for analysis. Sterile bandage was placed. Final images were stored. Patient tolerated the procedure well and remained hemodynamically stable throughout. No complications were encountered and no significant blood loss. IMPRESSION: Status post ultrasound-guided aspirate of left sternoclavicular phlegmon/abscess with small sample retrieved for diagnostic purpose. The majority was discovered to be phlegmon and not frank abscess. Signed, Dulcy Fanny. Dellia Nims, RPVI [facility] Radiology Electronically Signed   By: Corrie Mckusick D.O.   On: 11/03/2017 12:35   Mr Larose Kells EQ Contrast  Result Date: 11/02/2017 CLINICAL DATA:  77 year old male presents with left neck pain, infection suspected. Attention left sternoclavicular joint. EXAM: MR STERNUM WITHOUT AND WITH CONTRAST TECHNIQUE: Multiplanar MR imaging of the sternum without and with IV contrast. CONTRAST:  7 mL Gadavist IV COMPARISON:  None. FINDINGS: Abnormal enhancing fluid collection centered within the left sternocleidomastoid muscle measuring at least 6.3 x 1.2 x 2 cm is identified contiguous with the left sternoclavicular joint and joint fluid. Findings are in keeping with pyomyositis of the sternocleidomastoid. Given that this collection is contiguous with the left sternoclavicular joint and joint fluid, the possibility of Lanelle Lindo septic joint is raised. No definite  evidence of osteomyelitis is noted of the sternoclavicular joints. Symmetric subchondral cystic change likely degenerative in etiology is currently noted. No apparent cervical lymphadenopathy the included cervical spine. IMPRESSION: 1. Abnormal enhancing serpiginous fluid measuring 6.3 x 1.2 x 2 cm tracking within the left sternocleidomastoid muscle and contiguous with the left sternoclavicular joint is noted. Findings raise concern for pyomyositis possibly from septic arthritis of the left sternoclavicular joint. 2. 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 No frank bone destruction or marrow signal abnormalities definitive for osteomyelitis. Electronically Signed   By: Ashley Royalty M.D.   On: 11/02/2017 17:49        Scheduled Meds: . brimonidine  1 drop Both Eyes Q12H  . cholecalciferol  1,000 Units Oral Daily  . famotidine  20 mg Oral BID  . furosemide  20 mg Oral Daily  . hydrALAZINE  10 mg Intravenous Once  . imatinib  300 mg Oral Q1200  . insulin aspart  0-9 Units Subcutaneous TID WC  . insulin detemir  20 Units Subcutaneous Daily  . isosorbide mononitrate  30 mg Oral Daily  . lisinopril  10 mg Oral Daily  . multivitamin with minerals  1 tablet Oral Daily  . mupirocin ointment   Nasal BID  . predniSONE  3 mg Oral Q breakfast  . pyridostigmine  60 mg Oral Daily  . rosuvastatin  5 mg Oral Daily  . timolol  1 drop Both Eyes BID   Continuous Infusions: . cefTRIAXone (ROCEPHIN)  IV    . heparin 800 Units/hr (11/04/17 0858)  . vancomycin 750 mg (11/04/17 0422)     LOS: 5 days    Time spent: over 44 min    Fayrene Helper, MD [facility] Pager [phone]  If 7PM-7AM, please contact night-coverage [email or web address] Password [email or web address] 11/04/2017, 10:10 AM

## 2017-11-04 NOTE — Progress Notes (Addendum)
CSW acknowledging PT/OT recommendation for SNF placement. CSW met with patient to discuss, and patient would prefer to go home and have therapy work with him at home. Patient says he is sick of being in the hospital and doesn't want to have to go anywhere else, he just wants to go home. Patient says his wife is able to help him 24 hours a day. Patient couldn't remember who he had used when he had his stroke 2 years ago, but the company he used before did a great job. Per chart review, patient used [place] after his stroke. Patient already has a walker at home.  Of note, patient was up and walking back to the bed upon CSW arrival; he had gotten up and taken himself to the bathroom independently, said he had no issues going on his own.  CSW alerted RNCM. CSW signing off.  Laveda Abbe, [place] Clinical Social Worker [phone]

## 2017-11-04 NOTE — Consult Note (Signed)
[name], T[place] 77 y.o., male [id number]     Chief Complaint: LEFT neck pain and swelling HPI: 77 yo wm, multiple medical issues.  Admitted with LEFT neck swelling and neck/arm pain. MRI showed degenerative changes LEFT sternoclavicular joint.  ?abscess tracking up inside SCM muscle.  Underwent IR aspiration and drainage yesterday.  Today, feels better with improved arm ROM and less neck pain.  WBC stable at 12.7K.    PMH: Past Medical History:  Diagnosis Date  . Bruises easily    d/t being on Eliquis  . Carotid artery disease ([place])    a. Duplex 05/2014: 35-59% RICA, 7-41% LICA, elevated velocities in right subclavian artery, normal left subclavian artery..  . Chronic back pain    HNP  . Chronic kidney disease (CKD)   . Claustrophobia    takes Ativan as needed  . 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 Coronary atherosclerosis of unspecified type of vessel, native or graft    a. cath in 2003 showed 10-20% LM, scattered 20% prox-mid LAD, 30% more distal LAD, 50-60% stenosis of LAD towards apex, 20% Cx, 30% dRCA, focal 95% stenosis in smaller of 2 branches of PDA, EF 60-65%.  . Diabetes mellitus ([place])   . Diarrhea    takes Imodium daily as needed  . Diverticulosis   . Diverticulosis of colon (without mention of hemorrhage)   . DJD (degenerative joint disease)   . DVT (deep venous thrombosis) ([place]) 07/2011  . Esophageal stricture   . Essential hypertension    takes Imdur and Lisinopril daily  . Gastric polyps    benign  . GERD (gastroesophageal reflux disease)    takes Nexium daily  . Glaucoma    uses eye drops  . History of bronchitis    not sure when the last time  . History of colon polyps    benign  . History of kidney stones   . History of kidney stones   . Insomnia    takes Melatonin nightly as needed  . Mixed hyperlipidemia    takes Crestor daily  . Myasthenia gravis ([place])    uses prednisone  . Osteoporosis 2016  . Peripheral edema    takes  Lasix daily  . Pituitary tumor    checks Dr[facility]Walden at [facility] checks it every Jan   . Pneumonia 01/2016  . PONV (postoperative nausea and vomiting)   . Ptosis   . Pulmonary embolus ([place]) 2012  . Stroke [facility]) 06/2015    Surg Hx: Past Surgical History:  Procedure Laterality Date  . ABDOMINAL AORTOGRAM W/LOWER EXTREMITY N/A 11/18/2016   Procedure: ABDOMINAL AORTOGRAM W/LOWER EXTREMITY;  Surgeon: [name], MD;  Location: [place] [facility];  Service: Cardiovascular;  Laterality: N/A;  . BACK SURGERY     x2, lumbar and cervical  . CARDIAC CATHETERIZATION    . CATARACT EXTRACTION Bilateral 12/12  . COLONOSCOPY    . ESOPHAGOGASTRODUODENOSCOPY ENDOSCOPY  04/2015  . IR GENERIC HISTORICAL  03/03/2016   IR IVC FILTER PLMT / S&I [name] GUID/MOD SED 03/03/2016 [name], MD [facility]  . IR GENERIC HISTORICAL  04/09/2016   IR RADIOLOGIST EVAL & MGMT 04/09/2016 [name], MD [facility] INTER[facility]  . IR GENERIC HISTORICAL  04/30/2016   IR IVC FILTER RETRIEVAL / S&I [name] GUID/MOD SED 04/30/2016 [name], MD [facility]INTER[facility] RAD  . LITHOTRIPSY    . LUMBAR LAMINECTOMY/DECOMPRESSION MICRODISCECTOMY Right 09/14/2012   Procedure: Right Lumbar three-four, four-five, Lumbar five-Sacral one  decompressive laminectomy;  Surgeon: [name], MD;  Location: [place] [facility];  Service: Neurosurgery;  Laterality: Right;  . LUMBAR LAMINECTOMY/DECOMPRESSION MICRODISCECTOMY Left 03/11/2016   Procedure: Left Lumbar Two-ThreeMicrodiscectomy;  Surgeon: [name], MD;  Location: [place];  Service: Neurosurgery;  Laterality: Left;  . PERIPHERAL VASCULAR INTERVENTION  11/18/2016   Procedure: PERIPHERAL VASCULAR INTERVENTION;  Surgeon: [name], MD;  Location: [place] [facility];  Service: Cardiovascular;;  Left popliteal  . SHOULDER SURGERY Left 05/07/2009  . TONSILLECTOMY      FHx:   Family History  Problem Relation Age of Onset  . Heart disease Mother   . Heart attack Mother   . Stroke Mother   .  Heart disease Father   . Heart attack Father   . Stroke Father   . Breast cancer Sister        Twin   . Heart attack Sister   . Dementia Sister   . Heart disease Sister   . Heart attack Sister   . Clotting disorder Sister   . Heart attack Sister   . Colon cancer Neg Hx    SocHx:  reports that he has never smoked. He has never used smokeless tobacco. He reports that he does not drink alcohol or use drugs.  ALLERGIES:  Allergies  Allergen Reactions  . Phenergan [Promethazine Hcl] Swelling and Other (See Comments)    Cannot be combined with Zofran because swelling of the eyes resulted and patient became very restless-  . Zofran [Ondansetron Hcl] Swelling and Other (See Comments)    Cannot be combined with Phenergan because swelling of the eyes resulted and patient became very restless-  . Sulfamethoxazole Rash and Itching    Medications Prior to Admission  Medication Sig Dispense Refill  . brimonidine-timolol (COMBIGAN) 0.2-0.5 % ophthalmic solution Place 1 drop into both eyes every 12 (twelve) hours.    . cholecalciferol (VITAMIN D) 1000 units tablet Take 1,000 Units by mouth daily.     Marland Kitchen ELIQUIS 5 MG TABS tablet TAKE 1 TABLET (5 MG TOTAL) BY MOUTH 2 (TWO) TIMES DAILY. 60 tablet 3  . furosemide (LASIX) 20 MG tablet Take 1 tablet (20 mg total) by mouth daily. 90 tablet 3  . imatinib (GLEEVEC) 100 MG tablet Take 300 mg by mouth daily at 12 noon. Take with meals and large glass of water.Caution:Chemotherapy     . isosorbide mononitrate (IMDUR) 30 MG 24 hr tablet Take 1 tablet (30 mg total) by mouth daily. 90 tablet 3  . lidocaine (LIDODERM) 5 % Place 1 patch onto the skin daily as needed (for back pain). Remove & Discard patch within 12 hours or as directed by MD     . lisinopril (PRINIVIL,ZESTRIL) 10 MG tablet Take 1 tablet (10 mg total) by mouth daily. 30 tablet 11  . loperamide (IMODIUM) 2 MG capsule Take 2-4 mg by mouth 3 (three) times daily as needed for diarrhea or loose stools.      . Multiple Vitamin (MULTIVITAMIN) tablet Take 1 tablet by mouth daily.    Marland Kitchen oxyCODONE (OXY IR/ROXICODONE) 5 MG immediate release tablet Take 1 tablet (5 mg total) by mouth every 6 (six) hours as needed for moderate pain or severe pain. 30 tablet 0  . predniSONE (DELTASONE) 1 MG tablet Take 3 tablets (3 mg total) by mouth daily with breakfast. 270 tablet 1  . pyridostigmine (MESTINON) 60 MG tablet Take 60 mg by mouth daily.    . ranitidine (ZANTAC) 150 MG  tablet Take 150 mg by mouth daily at 12 noon.     . rosuvastatin (CRESTOR) 10 MG tablet Take 5 mg by mouth daily.    Marland Kitchen esomeprazole (NEXIUM) 40 MG capsule Take 1 capsule (40 mg total) by mouth daily. (Patient not taking: Reported on 10/30/2017) 30 capsule 0  . orphenadrine (NORFLEX) 100 MG tablet Take 1 tablet (100 mg total) by mouth 2 (two) times daily. (Patient not taking: Reported on 10/30/2017) 30 tablet 0    Results for orders placed or performed during the hospital encounter of 10/30/17 (from the past 48 hour(s))  Glucose, capillary     Status: Abnormal   Collection Time: 11/02/17  8:27 AM  Result Value Ref Range   Glucose-Capillary 218 (H) 70 - 99 mg/dL  Glucose, capillary     Status: Abnormal   Collection Time: 11/02/17 11:27 AM  Result Value Ref Range   Glucose-Capillary 240 (H) 70 - 99 mg/dL   Comment 1 Notify RN    Comment 2 Document in Chart   APTT     Status: Abnormal   Collection Time: 11/02/17  4:24 PM  Result Value Ref Range   aPTT 81 (H) 24 - 36 seconds    Comment:        IF BASELINE aPTT IS ELEVATED, SUGGEST PATIENT RISK ASSESSMENT BE USED TO DETERMINE APPROPRIATE ANTICOAGULANT THERAPY. Performed at [facility], [place] [address], [place], [place] [address]   Glucose, capillary     Status: Abnormal   Collection Time: 11/02/17  4:40 PM  Result Value Ref Range   Glucose-Capillary 136 (H) 70 - 99 mg/dL   Comment 1 Notify RN    Comment 2 Document in Chart   Glucose, capillary     Status: Abnormal   Collection  Time: 11/02/17  9:55 PM  Result Value Ref Range   Glucose-Capillary 206 (H) 70 - 99 mg/dL   Comment 1 Notify RN    Comment 2 Document in Chart   APTT     Status: Abnormal   Collection Time: 11/02/17 11:30 PM  Result Value Ref Range   aPTT 98 (H) 24 - 36 seconds    Comment:        IF BASELINE aPTT IS ELEVATED, SUGGEST PATIENT RISK ASSESSMENT BE USED TO DETERMINE APPROPRIATE ANTICOAGULANT THERAPY. Performed at [facility], [place] [address], [place], [name] [address]   Heparin level (unfractionated)     Status: Abnormal   Collection Time: 11/03/17  3:56 AM  Result Value Ref Range   Heparin Unfractionated 0.98 (H) 0.30 - 0.70 IU/mL    Comment: (NOTE) If heparin results are below expected values, and patient dosage has  been confirmed, suggest follow up testing of antithrombin III levels. Performed at [facility], [place] [address] [place], [place] [address]   APTT     Status: Abnormal   Collection Time: 11/03/17  3:56 AM  Result Value Ref Range   aPTT 100 (H) 24 - 36 seconds    Comment:        IF BASELINE aPTT IS ELEVATED, SUGGEST PATIENT RISK ASSESSMENT BE USED TO DETERMINE APPROPRIATE ANTICOAGULANT THERAPY. Performed at [facility], [place] [address], [place] [address]   CBC     Status: Abnormal   Collection Time: 11/03/17  3:56 AM  Result Value Ref Range   WBC 12.7 (H) 4.0 - 10.5 K/uL   RBC 3.08 (L) 4.22 - 5.81 MIL/uL   Hemoglobin 10.0 (L)  13.0 - 17.0 g/dL   HCT 29.2 (L) 39.0 - 52.0 %   MCV 94.8 78.0 - 100.0 fL   MCH 32.5 26.0 - 34.0 pg   MCHC 34.2 30.0 - 36.0 g/dL   RDW 14.3 11.5 - 15.5 %   Platelets 170 150 - 400 K/uL    Comment: Performed at [facility] [address] [place], [place] [address]  Basic metabolic panel     Status: Abnormal   Collection Time: 11/03/17  3:56 AM  Result Value Ref Range   Sodium 136 135 - 145 mmol/L   Potassium 4.2 3.5 - 5.1 mmol/L   Chloride 103 98 - 111 mmol/L   CO2 22 22 - 32 mmol/L   Glucose,  Bld 179 (H) 70 - 99 mg/dL   BUN 23 8 - 23 mg/dL   Creatinine, Ser 1.28 (H) 0.61 - 1.24 mg/dL   Calcium 8.4 (L) 8.9 - 10.3 mg/dL   GFR calc non Af Amer 52 (L) >60 mL/min   GFR calc Af Amer 60 (L) >60 mL/min    Comment: (NOTE) The eGFR has been calculated using the CKD EPI equation. This calculation has not been validated in all clinical situations. eGFR's persistently <60 mL/min signify possible Chronic Kidney Disease.    Anion gap 11 5 - 15    Comment: Performed at [facility] [address], [place], [place] [address]  Glucose, capillary     Status: Abnormal   Collection Time: 11/03/17  6:17 AM  Result Value Ref Range   Glucose-Capillary 165 (H) 70 - 99 mg/dL   Comment 1 Notify RN    Comment 2 Document in Chart   Aerobic/Anaerobic Culture (surgical/deep wound)     Status: None (Preliminary result)   Collection Time: 11/03/17 12:18 PM  Result Value Ref Range   Specimen Description ABSCESS    Special Requests LEFT STERNOCLEIDOMASTOID MUSCLE    Gram Stain      ABUNDANT WBC PRESENT, PREDOMINANTLY PMN NO ORGANISMS SEEN Performed at [facility], [place] [address] [place], [place] [address]    Culture PENDING    Report Status PENDING   Glucose, capillary     Status: Abnormal   Collection Time: 11/03/17  4:58 PM  Result Value Ref Range   Glucose-Capillary 258 (H) 70 - 99 mg/dL   Comment 1 Notify RN    Comment 2 Document in Chart   Glucose, capillary     Status: Abnormal   Collection Time: 11/03/17  9:50 PM  Result Value Ref Range   Glucose-Capillary 219 (H) 70 - 99 mg/dL  Glucose, capillary     Status: Abnormal   Collection Time: 11/03/17 10:28 PM  Result Value Ref Range   Glucose-Capillary 211 (H) 70 - 99 mg/dL  Heparin level (unfractionated)     Status: Abnormal   Collection Time: 11/04/17  5:57 AM  Result Value Ref Range   Heparin Unfractionated 0.97 (H) 0.30 - 0.70 IU/mL    Comment: (NOTE) If heparin results are below expected values, and patient dosage  has  been confirmed, suggest follow up testing of antithrombin III levels. Performed at [facility], [place] [address], [place], [place] [address]   APTT     Status: Abnormal   Collection Time: 11/04/17  5:57 AM  Result Value Ref Range   aPTT 95 (H) 24 - 36 seconds    Comment:        IF BASELINE aPTT IS ELEVATED, SUGGEST  PATIENT RISK ASSESSMENT BE USED TO DETERMINE APPROPRIATE ANTICOAGULANT THERAPY. Performed at [facility], [place] [address], [place], [place] [address]   CBC     Status: Abnormal   Collection Time: 11/04/17  5:57 AM  Result Value Ref Range   WBC 12.7 (H) 4.0 - 10.5 K/uL   RBC 3.38 (L) 4.22 - 5.81 MIL/uL   Hemoglobin 10.7 (L) 13.0 - 17.0 g/dL   HCT 31.5 (L) 39.0 - 52.0 %   MCV 93.2 78.0 - 100.0 fL   MCH 31.7 26.0 - 34.0 pg   MCHC 34.0 30.0 - 36.0 g/dL   RDW 14.0 11.5 - 15.5 %   Platelets 171 150 - 400 K/uL    Comment: Performed at [facility], [place] [address] [place], [place] [address]  Glucose, capillary     Status: Abnormal   Collection Time: 11/04/17  6:09 AM  Result Value Ref Range   Glucose-Capillary 159 (H) 70 - 99 mg/dL   Comment 1 Notify RN    Comment 2 Document in Chart    *Note: Due to a large number of results and/or encounters for the requested time period, some results have not been displayed. A complete set of results can be found in Results Review.   US Guided Needle Placement  Result Date: 11/03/2017 INDICATION: 77 year old male with a history of left sternocleidomastoid abscess/phlegmon related to a presumed septic left sternoclavicular joint. He presents for aspiration EXAM: IR ULTRASOUND GUIDED ASPIRATION/DRAINAGE MEDICATIONS: The patient is currently admitted to the hospital and receiving intravenous antibiotics. The antibiotics were administered within an appropriate time frame prior to the initiation of the procedure. ANESTHESIA/SEDATION: None COMPLICATIONS: None PROCEDURE: Informed written consent was obtained from the  patient after a thorough discussion of the procedural risks, benefits and alternatives. All questions were addressed. Maximal Sterile Barrier Technique was utilized including caps, mask, sterile gowns, sterile gloves, sterile drape, hand hygiene and skin antiseptic. A timeout was performed prior to the initiation of the procedure. Patient positioned supine position on the ultrasound table. Ultrasound images of the left sternocleidomastoid were performed with images stored sent to PACs. Patient is prepped and draped in the usual sterile fashion. 1% lidocaine was used for local anesthesia. 18 gauge needle was advanced under ultrasound guidance into the small abscess within the muscle. Approximately 1 cc of fluid aspirated for culture. A Yueh needle/catheter was then placed under ultrasound with the catheter centered within the fluid collection. Again attempt was performed for more volume with approximately 1 cc fluid removed. Two samples were sent to the lab for analysis. Sterile bandage was placed. Final images were stored. Patient tolerated the procedure well and remained hemodynamically stable throughout. No complications were encountered and no significant blood loss. IMPRESSION: Status post ultrasound-guided aspirate of left sternoclavicular phlegmon/abscess with small sample retrieved for diagnostic purpose. The majority was discovered to be phlegmon and not frank abscess. Signed, Dulcy Fanny. Dellia Nims, RPVI [facility] Radiology Electronically Signed   By: Corrie Mckusick D.O.   On: 11/03/2017 12:35   Mr Larose Kells UJ Contrast  Result Date: 11/02/2017 CLINICAL DATA:  77 year old male presents with left neck pain, infection suspected. Attention left sternoclavicular joint. EXAM: MR STERNUM WITHOUT AND WITH CONTRAST TECHNIQUE: Multiplanar MR imaging of the sternum without and with IV contrast. CONTRAST:  7 mL Gadavist IV COMPARISON:  None. FINDINGS: Abnormal  enhancing fluid collection centered within the left sternocleidomastoid muscle measuring at least 6.3 x 1.2 x 2  cm is identified contiguous with the left sternoclavicular joint and joint fluid. Findings are in keeping with pyomyositis of the sternocleidomastoid. Given that this collection is contiguous with the left sternoclavicular joint and joint fluid, the possibility of a septic joint is raised. No definite evidence of osteomyelitis is noted of the sternoclavicular joints. Symmetric subchondral cystic change likely degenerative in etiology is currently noted. No apparent cervical lymphadenopathy the included cervical spine. IMPRESSION: 1. Abnormal enhancing serpiginous fluid measuring 6.3 x 1.2 x 2 cm tracking within the left sternocleidomastoid muscle and contiguous with the left sternoclavicular joint is noted. Findings raise concern for pyomyositis possibly from septic arthritis of the left sternoclavicular joint. 2. 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 No frank bone destruction or marrow signal abnormalities definitive for osteomyelitis. Electronically Signed   By: Ashley Royalty M.D.   On: 11/02/2017 17:49      Blood pressure 139/77, pulse 89, temperature 98.3 F (36.8 C), temperature source Oral, resp. rate 18, height 5' 7"  (1.702 m), weight 78.2 kg, SpO2 98 %.  PHYSICAL EXAM: Overall appearance: animated.  Not grossly distressed.  Voice clear.  Breathing comfortably. Head:  NCAT Ears: not examined Nose:  Sl crusting LEFT nostril Oral Cavity: not examined Oral Pharynx/Hypopharynx/Larynx:  Not examined Neuro: not examined Neck:  Sl erythema and swelling LEFT sterno clavicular joint and lower SCM muscle.  Studies Reviewed:  MRI neck    Assessment/Plan Improved with IV antibiosis and IR I&D yesterday.   Plan:  No need for further drainage procedure today.  Follow WBC.  Await cultures.   Repeat CT neck next week.  Please call for further assistance.    Ileene Hutchinson [facility] 10/25/9448, 3:88 AM

## 2017-11-04 NOTE — Evaluation (Addendum)
Occupational Therapy Evaluation Patient Details Name: [name] MRN: [id number] DOB: Aug 09, 1940 Today's Date: 11/04/2017    History of Present Illness Pt is a 77 y.o. male with medical history significant of chronic back pain/multiple spine surgeries, myasthenia gravis, CML, recent hospitalization with stroke, spinal stenosis, coronary artery disease, pulmonary embolism with DVT on chronic anticoagulation with Eliquis, who presented to the ER with sudden onset of sharp neck and shoulder pain since early Friday 9/20am. MRI showing cervical stenosis at C3-4 and C4-5. Guided aspiration of  LSCM on 11/03/17, cultures pending.    Clinical Impression   PTA patient used RW for mobility without assistance, and required intermittent min assist for LB dressing from spouse.  He was admitted for above, and limited by pain and decreased ROM in L shoulder, impaired balance, generalized weakness, and decreased activity tolerance. Patient currently requires, setup assist for grooming, max assist for UB dressing, max assist for LB dressing, min assist for toilet transfers and mod assist for toileting.  Initially +2 for safety, progressing to +1 assist during session. Educated on safety, mobility, precautions, exercises for L UE with progressive ROM, and recommendations. Patient will benefit from continued OT services while admitted and after dc at SNF level in order to optimize independence and return to PLOF.  Will continue to follow.     Follow Up Recommendations  SNF;Supervision/Assistance - 24 hour    Equipment Recommendations  None recommended by OT    Recommendations for Other Services       Precautions / Restrictions Precautions Precautions: Fall(moderate risk) Restrictions Weight Bearing Restrictions: No      Mobility Bed Mobility               General bed mobility comments: seated in chair upon entry  Transfers Overall transfer level: Needs assistance Equipment used: Rolling  walker (2 wheeled) Transfers: Sit to/from Stand Sit to Stand: Min assist;Mod assist         General transfer comment: mod assist initally, cueing for hand placement and safety fading to min assist    Balance Overall balance assessment: Needs assistance Sitting-balance support: No upper extremity supported;Feet supported Sitting balance-Leahy Scale: Fair     Standing balance support: Bilateral upper extremity supported;During functional activity Standing balance-Leahy Scale: Fair Standing balance comment: relaint on B UE support                           ADL either performed or assessed with clinical judgement   ADL Overall ADL's : Needs assistance/impaired Eating/Feeding: Set up;Sitting   Grooming: Set up;Sitting   Upper Body Bathing: Minimal assistance;Sitting;Cueing for compensatory techniques Upper Body Bathing Details (indicate cue type and reason): cueing to encourage patient to utilize R UE and use L UE as able within pain limitations  Lower Body Bathing: Moderate assistance;Sit to/from stand Lower Body Bathing Details (indicate cue type and reason): requires assistance for buttocks and B feet  Upper Body Dressing : Maximal assistance;Sitting Upper Body Dressing Details (indicate cue type and reason): educated on compensatory techniques for pain of L shoulder  Lower Body Dressing: Maximal assistance;Sit to/from stand Lower Body Dressing Details (indicate cue type and reason): requires assistance for B socks, threading legs and pulling pants up on L side Toilet Transfer: Minimal assistance;Ambulation(simulated ) Toilet Transfer Details (indicate cue type and reason): cueing to push from chair on R side, min assist for safety and balance  Toileting- Clothing Manipulation and Hygiene: Moderate assistance;Sit to/from stand  Functional mobility during ADLs: Minimal assistance;Rolling walker General ADL Comments: pt fatigues easily, limited by pain in L  shoulder, and L hip pain/fatigue with mobility     Vision Baseline Vision/History: Wears glasses Wears Glasses: Reading only Patient Visual Report: No change from baseline Vision Assessment?: No apparent visual deficits     Perception     Praxis      Pertinent Vitals/Pain Pain Assessment: Faces Faces Pain Scale: Hurts even more Pain Location: L shoulder with movement and touch  Pain Intervention(s): Limited activity within patient's tolerance;Repositioned     Hand Dominance Right   Extremity/Trunk Assessment Upper Extremity Assessment Upper Extremity Assessment: LUE deficits/detail;Generalized weakness LUE Deficits / Details: limited shoulder flexion to 30 degrees due to pain with noted hypersensitivity shoulder area, WFL elbow and distal  LUE: Unable to fully assess due to pain LUE Sensation: (hypersensitivity at shoulder/chest area) LUE Coordination: decreased gross motor   Lower Extremity Assessment Lower Extremity Assessment: Defer to PT evaluation   Cervical / Trunk Assessment Cervical / Trunk Assessment: Other exceptions Cervical / Trunk Exceptions: hx cervical stenosis and multiple spinal surgeries   Communication Communication Communication: HOH(has B hearing aides)   Cognition Arousal/Alertness: Awake/alert Behavior During Therapy: WFL for tasks assessed/performed Overall Cognitive Status: Within Functional Limits for tasks assessed                                     General Comments  spouse present and supportive    Exercises Exercises: Other exercises Other Exercises Other Exercises: educated on AROM of L UE, progressive ROM at shoulder in order to increase ROM gradually within pain tolerance    Shoulder Instructions      Home Living Family/patient expects to be discharged to:: Private residence Living Arrangements: Spouse/significant other Available Help at Discharge: Family;Available 24 hours/day Type of Home: House Home Access:  Stairs to enter [employer] of Steps: 3, then 9 to first floor  Entrance Stairs-Rails: Right Home Layout: One level     Bathroom Shower/Tub: Occupational psychologist: Handicapped height Bathroom Accessibility: Yes   Home Equipment: Shower seat - built in;Walker - 2 wheels;Cane - single point          Prior Functioning/Environment Level of Independence: Needs assistance  Gait / Transfers Assistance Needed: used RW for moblity without assistance  ADL's / Homemaking Assistance Needed: spouse reports patient sometimes needing assistance for LB dressing             OT Problem List: Decreased strength;Decreased activity tolerance;Decreased range of motion;Impaired balance (sitting and/or standing);Decreased coordination;Decreased safety awareness;Decreased knowledge of use of DME or AE;Pain;Impaired UE functional use      OT Treatment/Interventions: Self-care/ADL training;Therapeutic exercise;DME and/or AE instruction;Therapeutic activities;Patient/family education;Balance training    OT Goals(Current goals can be found in the care plan section) Acute Rehab OT Goals Patient Stated Goal: to have less pain  OT Goal Formulation: With patient Time For Goal Achievement: 11/18/17 Potential to Achieve Goals: Good  OT Frequency: Min 2X/week   Barriers to D/C:            Co-evaluation PT/OT/SLP Co-Evaluation/Treatment: Yes Reason for Co-Treatment: Complexity of the patient's impairments (multi-system involvement);For patient/therapist safety;To address functional/ADL transfers   OT goals addressed during session: ADL's and self-care;Other (comment)(mobility)      AM-PAC PT "6 Clicks" Daily Activity     Outcome Measure Help from another person eating meals?: None Help  from another person taking care of personal grooming?: A Little Help from another person toileting, which includes using toliet, bedpan, or urinal?: A Lot Help from another person bathing  (including washing, rinsing, drying)?: A Lot Help from another person to put on and taking off regular upper body clothing?: A Lot Help from another person to put on and taking off regular lower body clothing?: A Lot 6 Click Score: 15   End of Session Equipment Utilized During Treatment: Gait belt;Rolling walker Nurse Communication: Mobility status;Other (comment)(pt needs)  Activity Tolerance: Patient tolerated treatment well Patient left: with call bell/phone within reach;with family/visitor present(seated EOB)  OT Visit Diagnosis: Unsteadiness on feet (R26.81);Other abnormalities of gait and mobility (R26.89);Muscle weakness (generalized) (M62.81);Pain Pain - Right/Left: Left Pain - part of body: Shoulder(neck)                Time: 8937-3428 OT Time Calculation (min): 20 min Charges:  OT General Charges $OT Visit: 1 Visit OT Evaluation $OT Eval Moderate Complexity: [place], OT Acute Rehabilitation Services Pager [phone] Office [phone]   Delight Stare 11/04/2017, 10:53 AM

## 2017-11-04 NOTE — Progress Notes (Signed)
ANTICOAGULATION CONSULT NOTE - Follow Up Consult  Pharmacy Consult for heparin Indication: h/o PE/DVT  Labs: Recent Labs    11/02/17 0552  11/02/17 2330 11/03/17 0356 11/04/17 0557  HGB 9.5*  --   --  10.0* 10.7*  HCT 29.2*  --   --  29.2* 31.5*  PLT 188  --   --  170 171  APTT 63*   < > 98* 100* 95*  HEPARINUNFRC 1.00*  --   --  0.98* 0.97*  CREATININE 1.29*  --   --  1.28*  --    < > = values in this interval not displayed.    Assessment/Plan:  77yo male continues on heparin while Eliquis on hold Decrease heparin to 800 units / hr Follow up AM labs  Thank you Anette Guarneri, PharmD [phone]  11/04/2017,8:50 AM

## 2017-11-04 NOTE — Progress Notes (Signed)
[facility] for Infectious Disease  Date of Admission:  10/30/2017     Total days of antibiotics 2         ASSESSMENT/PLAN  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  He has significant improvement in the upper extremity function today, but remains painful around the left sternoclavicular joint. ENT and CVTS with no surgical interventions at this time. Blood cultures are without growth to date and IR culture gram stain with no organisms and cultures pending. He has been afebrile and appears to be doing well.  1. Continue current dose of vancomycin and ceftriaxone pending culture results. Will likely need 4-6 weeks of therapy. 2. Monitor renal function while on vancomycin.    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 brimonidine  1 drop Both Eyes Q12H  . cholecalciferol  1,000 Units Oral Daily  . famotidine  20 mg Oral BID  . furosemide  20 mg Oral Daily  . hydrALAZINE  10 mg Intravenous Once  . imatinib  300 mg Oral Q1200  . insulin aspart  0-9 Units Subcutaneous TID WC  . insulin detemir  20 Units Subcutaneous Daily  . isosorbide mononitrate  30 mg Oral Daily  . lisinopril  10 mg Oral Daily  . multivitamin with minerals  1 tablet Oral Daily  . mupirocin ointment   Nasal BID  . predniSONE  3 mg Oral Q breakfast  . pyridostigmine  60 mg Oral Daily  . rosuvastatin  5 mg Oral Daily  . timolol  1 drop Both Eyes BID    SUBJECTIVE:  Afebrile  overnight. Gram stain without organisms and cultures re-incubated for better growth. Improved function of the left upper extremity and continued pain over the clavicle and sternoclavicular joint.  Allergies  Allergen Reactions  . Phenergan [Promethazine Hcl] Swelling and Other (See Comments)    Cannot be combined with Zofran because swelling of the eyes resulted and patient became very restless-  . Zofran [Ondansetron Hcl] Swelling and Other (See Comments)    Cannot be combined with Phenergan because swelling of the eyes resulted and patient became very restless-  . Sulfamethoxazole Rash and Itching     Review of Systems: Review of Systems  Constitutional: Negative for chills and fever.  Respiratory: Negative for cough, shortness of breath and wheezing.   Cardiovascular: Negative for chest pain and leg swelling.  Gastrointestinal: Negative for abdominal pain, constipation, diarrhea, nausea and vomiting.  Genitourinary: Negative for frequency and urgency.  Musculoskeletal:       Positive for sternal pain.   Skin: Negative for rash.      OBJECTIVE: Vitals:   11/03/17 1600 11/03/17 2009 11/03/17 2353 11/04/17 0332  BP: (!) 161/87 (!) 148/78 (!) 144/71 139/77  Pulse: 98 96 91 89  Resp: 18 18 18 18   Temp: 98.3 F (36.8 C) 97.9 F (36.6 C) 98 F (36.7 C) 98.3 F (36.8 C)  TempSrc: Oral Oral Oral Oral  SpO2: 97% 99% 98%  98%  Weight:      Height:       Body mass index is 27 kg/m.  Physical Exam  Constitutional: He is oriented to person, place, and time. He appears well-developed and well-nourished. No distress.  Cardiovascular: Normal rate, regular rhythm, normal heart sounds and intact distal pulses.  Pulmonary/Chest: Effort normal and breath sounds normal. He has no wheezes. He has no rales.  Musculoskeletal:  Edema and tenderness over left sternoclavicular joint. Elbow and wrist range of motion near normal and improved since last evaluation. Distal pulses and sensation  are appropriate.   Neurological: He is alert and oriented to person, place, and time.  Skin: Skin is warm and dry.  Psychiatric: He has a normal mood and affect. His behavior is normal.    Lab Results Lab Results  Component Value Date   WBC 12.7 (H) 11/04/2017   HGB 10.7 (L) 11/04/2017   HCT 31.5 (L) 11/04/2017   MCV 93.2 11/04/2017   PLT 171 11/04/2017    Lab Results  Component Value Date   CREATININE 1.33 (H) 11/04/2017   BUN 22 11/04/2017   NA 136 11/04/2017   K 3.9 11/04/2017   CL 101 11/04/2017   CO2 24 11/04/2017    Lab Results  Component Value Date   ALT 37 10/31/2017   AST 25 10/31/2017   ALKPHOS 55 10/31/2017   BILITOT 1.4 (H) 10/31/2017     Microbiology: Recent Results (from the past 240 hour(s))  Blood culture (routine x 2)     Status: None   Collection Time: 10/30/17  8:39 AM  Result Value Ref Range Status   Specimen Description BLOOD RIGHT ANTECUBITAL  Final   Special Requests   Final    BOTTLES DRAWN AEROBIC AND ANAEROBIC Blood Culture results may not be optimal due to an excessive volume of blood received in culture bottles   Culture   Final    NO GROWTH 5 DAYS Performed at [facility] [address] [place], [place] [address]    Report Status 11/04/2017 FINAL  Final  Blood culture (routine x 2)     Status: None   Collection Time: 10/30/17  8:40 AM  Result Value Ref Range Status   Specimen Description BLOOD LEFT HAND  Final   Special Requests   Final    BOTTLES DRAWN AEROBIC AND ANAEROBIC Blood Culture adequate volume   Culture   Final    NO GROWTH 5 DAYS Performed at [facility], [place] [address] [place], [place] [address]    Report Status 11/04/2017 FINAL  Final  Aerobic/Anaerobic Culture (surgical/deep wound)     Status: None (Preliminary result)   Collection Time: 11/03/17 12:18 PM  Result Value Ref Range Status   Specimen Description ABSCESS  Final   Special Requests LEFT STERNOCLEIDOMASTOID MUSCLE  Final   Gram Stain    Final    ABUNDANT WBC PRESENT, PREDOMINANTLY PMN NO ORGANISMS SEEN    Culture   Final    CULTURE REINCUBATED FOR BETTER GROWTH Performed at [facility], [place] [address] [place], [place] [address]    Report Status PENDING  Incomplete     Terri Piedra, [facility] for [place] Pager  11/04/2017  10:48 AM

## 2017-11-04 NOTE — Plan of Care (Signed)
Min assist brp per walker

## 2017-11-05 ENCOUNTER — Inpatient Hospital Stay: Payer: Self-pay

## 2017-11-05 DIAGNOSIS — L02818 Cutaneous abscess of other sites: Secondary | ICD-10-CM

## 2017-11-05 LAB — GLUCOSE, CAPILLARY
GLUCOSE-CAPILLARY: 94 mg/dL (ref 70–99)
GLUCOSE-CAPILLARY: 136 mg/dL — AB (ref 70–99)
Glucose-Capillary: 138 mg/dL — ABNORMAL HIGH (ref 70–99)
Glucose-Capillary: 119 mg/dL — ABNORMAL HIGH (ref 70–99)

## 2017-11-05 LAB — BASIC METABOLIC PANEL
ANION GAP: 9 (ref 5–15)
BUN: 21 mg/dL (ref 8–23)
CO2: 27 mmol/L (ref 22–32)
Calcium: 8.9 mg/dL (ref 8.9–10.3)
Chloride: 100 mmol/L (ref 98–111)
Creatinine, Ser: 1.17 mg/dL (ref 0.61–1.24)
GFR, EST NON AFRICAN AMERICAN: 57 mL/min — AB (ref >60)
Glucose, Bld: 91 mg/dL (ref 70–99)
POTASSIUM: 4 mmol/L (ref 3.5–5.1)
SODIUM: 136 mmol/L (ref 135–145)

## 2017-11-05 LAB — CBC
HCT: 34.2 % — ABNORMAL LOW (ref 39.0–52.0)
Hemoglobin: 11.5 g/dL — ABNORMAL LOW (ref 13.0–17.0)
MCH: 32.1 pg (ref 26.0–34.0)
MCHC: 33.6 g/dL (ref 30.0–36.0)
MCV: 95.5 fL (ref 78.0–100.0)
PLATELETS: 178 10*3/uL (ref 150–400)
RBC: 3.58 MIL/uL — AB (ref 4.22–5.81)
RDW: 14.3 % (ref 11.5–15.5)
WBC: 12.1 10*3/uL — AB (ref 4.0–10.5)

## 2017-11-05 LAB — APTT: aPTT: 66 seconds — ABNORMAL HIGH (ref 24–36)

## 2017-11-05 LAB — HEPARIN LEVEL (UNFRACTIONATED): HEPARIN UNFRACTIONATED: 0.62 [IU]/mL (ref 0.30–0.70)

## 2017-11-05 LAB — MAGNESIUM: MAGNESIUM: 2 mg/dL (ref 1.7–2.4)

## 2017-11-05 MED ORDER — SODIUM CHLORIDE 0.9% FLUSH
10.0000 mL | Freq: Two times a day (BID) | INTRAVENOUS | Status: DC
  Administered 2017-11-05 – 2017-11-06 (×3): 10 mL

## 2017-11-05 MED ORDER — LORAZEPAM 2 MG/ML IJ SOLN
0.5000 mg | Freq: Once | INTRAMUSCULAR | Status: AC | PRN
  Administered 2017-11-05: 0.5 mg via INTRAVENOUS
  Filled 2017-11-05: qty 1

## 2017-11-05 MED ORDER — APIXABAN 5 MG PO TABS
5.0000 mg | ORAL_TABLET | Freq: Two times a day (BID) | ORAL | Status: DC
  Administered 2017-11-05 – 2017-11-06 (×2): 5 mg via ORAL
  Filled 2017-11-05 (×2): qty 1

## 2017-11-05 MED ORDER — METOPROLOL TARTRATE 12.5 MG HALF TABLET
12.5000 mg | ORAL_TABLET | Freq: Two times a day (BID) | ORAL | Status: DC
  Administered 2017-11-05 – 2017-11-06 (×2): 12.5 mg via ORAL
  Filled 2017-11-05 (×2): qty 1

## 2017-11-05 MED ORDER — SODIUM CHLORIDE 0.9% FLUSH
10.0000 mL | INTRAVENOUS | Status: DC | PRN
  Administered 2017-11-06: 10 mL
  Filled 2017-11-05: qty 40

## 2017-11-05 NOTE — Progress Notes (Signed)
PROGRESS NOTE    [name]  EXN:[id number] DOB: 12-01-1940 DOA: 10/30/2017 PCP: Renato Shin, MD   Brief Narrative:  [name] [name] 77 y.o.malewith medical history significant ofchronic back pain/multiple spine surgeries, myasthenia gravis, CML, recent hospitalization with stroke, spinal stenosis, coronary artery disease, pulmonary embolism with DVT on chronic anticoagulation with Eliquis, who presented to the ER with sudden onset of sharp neck and shoulder pain since early Friday 9/20am. Underwent MRI of the cervical spine in the emergency room last night which noted -spine showed Multilevel spondylosis, with areas of significant spinal stenosis at C3-4 and C4-5 due to disc herniations. Cord compression with canal diameter as narrow as 4 mm. 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  Discussed with ID with group B strep on culture and recommending 6 weeks of ceftriaxone.  OPAT order placed.  - CT surgery c/s for septic arthritis, appreciate recs (awaiting final recommendations) - Discussed SCM abscess with ENT (recommending repeat CT next week - pt will need to f/u with Dr. Loanne Drilling for this, discussed recommendation to call for appointment next week to arrange this  today) - Follow cultures (few group B strep) - Blood cx NGTD x5 days - immunosuppressed  - Will place PICC today  Severe cervical stenosis/with radiculopathy/severe pain -Stopped Eliquis, now on IV heparin, neurosurgery Dr. Ronnald Ramp consulting -Pt will need to follow up with neurosurgery after discharge    History of DVT and pulmonary embolism  Hx CVA -Eliquis held, now on IV heparin (will continue heparin for now, but will discuss plan with surgery to help with timing of restarting eliquis - CT surgery attending to evaluate patient today)   History of myasthenia gravis/ocular -Continue Mestinon and resume prednisone - avoid exacerbating meds   History of CML -In remission, on Gleevac   History of prediabetes -Not on meds, diet controlled, presumed to be steroid-induced -On levemir 20 units daily here, follow closely   History of CAD/PAD - Remote history of CAD, on medical management due to numerous comorbidities - history of distal SFA stent in 11/2016, no longer on antiplatelet agents  History of CVA -ELiquis held now, continue IV heparin, statin  Irregular HR: irregular HR on exam, mildly tachy today.  EKG with irregularity, but P waves present, not c/w atrial fibrillation.  Consider frequent PAC's or MAT.  Will start metoprolol.  DVT prophylaxis: heparin Code Status: DNR Family Communication: wife at bedside Disposition Plan: pending final eval by CT surgery   Consultants:   ID  IR  CT surgery  ENT  Procedures:   US guided aspiration L SCM 9/25  Antimicrobials:  Anti-infectives (From admission, onward)   Start     Dose/Rate Route Frequency Ordered Stop   11/04/17 0900  cefTRIAXone (ROCEPHIN) 2 g in sodium chloride  0.9 % 100 mL IVPB     2 g 200 mL/hr over 30 Minutes Intravenous Every 24 hours 11/04/17 0759     11/03/17 1445  vancomycin (VANCOCIN) IVPB 750 mg/150 ml premix     750 mg 150 mL/hr over 60 Minutes Intravenous Every 12 hours 11/03/17 1447      10/30/17 1945  cefTRIAXone (ROCEPHIN) 1 g in sodium chloride 0.9 % 100 mL IVPB  Status:  Discontinued     1 g 200 mL/hr over 30 Minutes Intravenous  Once 10/30/17 1941 11/01/17 1332   10/30/17 1230  ceFEPIme (MAXIPIME) 2 g in sodium chloride 0.9 % 100 mL IVPB  Status:  Discontinued     2 g 200 mL/hr over 30 Minutes Intravenous Every 24 hours 10/30/17 1146 10/31/17 1019   10/30/17 1145  vancomycin (VANCOCIN) IVPB 1000 mg/200 mL premix     1,000 mg 200 mL/hr over 60 Minutes Intravenous  Once 10/30/17 1138 10/30/17 1303      Subjective: Notes feeling significantly better.  Objective: Vitals:   11/04/17 1956 11/04/17 2336 11/05/17 0319 11/05/17 0821  BP: (!) 148/83 (!) 145/87 (!) 159/81 (!) 159/72  Pulse: 78 79 63 (!) 55  Resp: 20 18 18 16   Temp: 98.4 F (36.9 C) 98.1 F (36.7 C) 98 F (36.7 C) 98.7 F (37.1 C)  TempSrc: Oral Oral Oral Oral  SpO2: 97% 98% 99% 96%  Weight:      Height:        Intake/Output Summary (Last 24 hours) at 11/05/2017 1306 Last data filed at 11/05/2017 7893 Gross per 24 hour  Intake 240 ml  Output -  Net 240 ml   Filed Weights   10/30/17 1038 10/30/17 2230 10/31/17 1228  Weight: 79.4 kg 75.4 kg 78.2 kg    Examination:  General: No acute distress. Cardiovascular: Heart sounds show Riyah Bardon irregular rate and rhythm Lungs: Clear to auscultation bilaterally with good air movement.  Abdomen: Soft, nontender, nondistended  Neurological: Alert and oriented 3. Moves all extremities 4. Cranial nerves II through XII grossly intact. Skin: Warm and dry. No rashes or lesions. MSK: ttp of SCM and L [place] joint progressively improved Extremities: No clubbing or cyanosis. No edema.  Psychiatric: Mood and affect are normal. Insight and judgment are appropriate.     Data Reviewed: I have personally reviewed following labs and imaging studies  CBC: Recent Labs  Lab 10/30/17 0838  11/01/17 0436 11/02/17 0552 11/03/17 0356 11/04/17 0557 11/05/17 0527    WBC 13.8*   < > 17.8* 15.1* 12.7* 12.7* 12.1*  NEUTROABS 10.3*  --   --   --   --   --   --   HGB 11.6*   < > 10.3* 9.5* 10.0* 10.7* 11.5*  HCT 35.1*   < > 30.2* 29.2* 29.2* 31.5* 34.2*  MCV 94.9   < > 94.1 96.4 94.8 93.2 95.5  PLT 186   < > 187 188 170 171 178   < > = values in this interval not displayed.   Basic Metabolic Panel: Recent Labs  Lab 10/31/17 0646 11/02/17 0552 11/03/17 0356 11/04/17 0557 11/05/17 0527  NA 137 135 136 136 136  K 4.0 4.0 4.2 3.9 4.0  CL 104 103 103 101 100  CO2 19* 23 22 24 27   GLUCOSE 183* 221* 179* 163* 91  BUN 15 23 23 22 21   CREATININE 1.21 1.29* 1.28* 1.33* 1.17  CALCIUM 8.1* 8.4* 8.4* 8.5* 8.9  MG  --   --   --  2.1 2.0   GFR: Estimated Creatinine Clearance: 47.9 mL/min (by C-G formula based on SCr of 1.17 mg/dL). Liver Function Tests: Recent Labs  Lab 10/31/17 0646  AST 25  ALT 37  ALKPHOS 55  BILITOT 1.4*  PROT 5.4*  ALBUMIN 2.6*   No results for input(s): LIPASE, AMYLASE in the last 168 hours. No results for input(s): AMMONIA in the last 168 hours. Coagulation Profile: No results for input(s): INR, PROTIME in the last 168 hours. Cardiac Enzymes: No results for input(s): CKTOTAL, CKMB, CKMBINDEX, TROPONINI in the last 168 hours. BNP (last 3 results) Recent Labs    10/06/17 1139  PROBNP 73.0   HbA1C: No results for input(s): HGBA1C in the last 72 hours. CBG: Recent Labs  Lab 11/04/17 1151 11/04/17 1630 11/04/17 2113 11/05/17 0610 11/05/17 1143  GLUCAP 181* 168* 190* 94 136*   Lipid Profile: No results for input(s): CHOL, HDL, LDLCALC, TRIG, CHOLHDL, LDLDIRECT in the last 72 hours. Thyroid Function Tests: No results for input(s): TSH, T4TOTAL, FREET4, T3FREE, THYROIDAB in the last 72 hours. Anemia Panel: No results for input(s): VITAMINB12, FOLATE, FERRITIN, TIBC, IRON, RETICCTPCT in the last 72 hours. Sepsis Labs: Recent Labs  Lab 10/30/17 0849  LATICACIDVEN 1.12    Recent Results (from the past 240  hour(s))  Blood culture (routine x 2)     Status: None   Collection Time: 10/30/17  8:39 AM  Result Value Ref Range Status   Specimen Description BLOOD RIGHT ANTECUBITAL  Final   Special Requests   Final    BOTTLES DRAWN AEROBIC AND ANAEROBIC Blood Culture results may not be optimal due to an excessive volume of blood received in culture bottles   Culture   Final    NO GROWTH 5 DAYS Performed at [facility], [place] [address] [place], [place] [address]    Report Status 11/04/2017 FINAL  Final  Blood culture (routine x 2)     Status: None   Collection Time: 10/30/17  8:40 AM  Result Value Ref Range Status   Specimen Description BLOOD LEFT HAND  Final   Special Requests   Final    BOTTLES DRAWN AEROBIC AND ANAEROBIC Blood Culture adequate volume   Culture   Final    NO GROWTH 5 DAYS Performed at [facility], [place] [address], [place], [place] [address]    Report Status 11/04/2017 FINAL  Final  Aerobic/Anaerobic Culture (surgical/deep wound)     Status: None (Preliminary result)   Collection Time: 11/03/17 12:18 PM  Result Value Ref Range Status   Specimen Description ABSCESS  Final   Special Requests LEFT STERNOCLEIDOMASTOID MUSCLE  Final   Gram Stain   Final    ABUNDANT WBC PRESENT, PREDOMINANTLY PMN NO ORGANISMS SEEN Performed at [facility], [place] [address], [place], [place] [address]    Culture   Final    FEW GROUP B STREP(S.AGALACTIAE)ISOLATED TESTING AGAINST S. AGALACTIAE NOT ROUTINELY PERFORMED DUE TO PREDICTABILITY OF AMP/PEN/VAN SUSCEPTIBILITY. NO ANAEROBES ISOLATED; CULTURE IN PROGRESS FOR 5 DAYS    Report Status PENDING  Incomplete         Radiology Studies: [place] Ekg Site Rite  Result Date: 11/05/2017 If Site Rite image not attached, placement could not be confirmed due to current cardiac rhythm.       Scheduled Meds: . brimonidine  1 drop Both Eyes Q12H  . cholecalciferol  1,000 Units Oral Daily  . famotidine  20 mg Oral BID  .  furosemide  20 mg Oral Daily  . hydrALAZINE  10 mg Intravenous Once  . imatinib  300 mg Oral Q1200  . insulin aspart  0-9 Units Subcutaneous TID WC  . insulin detemir  20 Units Subcutaneous Daily  . isosorbide mononitrate  30 mg Oral Daily  . lisinopril  10 mg Oral Daily  . multivitamin with minerals  1 tablet Oral Daily  . mupirocin ointment   Nasal BID  . predniSONE  3 mg Oral Q breakfast  . pyridostigmine  60 mg Oral Daily  . rosuvastatin  5 mg Oral Daily  . timolol  1 drop Both Eyes BID   Continuous Infusions: . cefTRIAXone (ROCEPHIN)  IV 2 g (11/05/17 0855)  . heparin 800 Units/hr (11/04/17 0858)  . vancomycin Stopped (11/05/17 0701)     LOS: 6 days    Time spent: over 30 min    Fayrene Helper, MD [facility] Pager [phone]  If 7PM-7AM, please contact night-coverage [email or web address] Password [email or web address] 11/05/2017, 1:06 PM

## 2017-11-05 NOTE — Progress Notes (Addendum)
PHARMACY CONSULT NOTE FOR:  OUTPATIENT  PARENTERAL ANTIBIOTIC THERAPY (OPAT)  Indication: [place] joint abscess Regimen: Ceftriaxone 2 grams iv Q 24 hours End date: December 14, 2017  IV antibiotic discharge orders are pended. To discharging provider:  please sign these orders via discharge navigator,  Select New Orders & click on the button choice - Manage This Unsigned Work.     Thank you for allowing pharmacy to be a part of this patient's care.  Tad Moore 11/05/2017, 1:24 PM

## 2017-11-05 NOTE — Progress Notes (Signed)
[name] for Infectious Disease  Date of Admission:  10/30/2017             ASSESSMENT/PLAN  [name] continues to have improvement with increasing mobility and decreasing pain in the left sternoclavicular joint and upper extremity. [name] is on Day 3 of antimicrobial therapy with vancomycin and ceftriaxone. Tolerating the regimen with no adverse side effects. Cultures with few unidentified organisms. [name] will need at least 4 weeks of therapy.   1. Continue ceftriaxone.  Aim for 6 weeks.   2. PICC line today. 3. Culture GBS   Principal Problem:   Septic arthritis of left sternoclavicular joint (H[facility]) Active Problems:   Chronic myeloid leukemia in remission ([facility])   HYPERLIPIDEMIA, MIXED   Essential hypertension   CAD (coronary artery disease)   Diabetes mellitus type 2, controlled ([place])   History of pulmonary embolism   Long term (current) use of anticoagulants   GERD with stricture   Myasthenia gravis ([place])   Cervical spinal cord compression ([place])   . brimonidine  1 drop Both Eyes Q12H  . cholecalciferol  1,000 Units Oral Daily  . famotidine  20 mg Oral BID  . furosemide  20 mg Oral Daily  . hydrALAZINE  10 mg Intravenous Once  . imatinib  300 mg Oral Q1200  . insulin aspart  0-9 Units Subcutaneous TID WC  . insulin detemir  20 Units Subcutaneous Daily  . isosorbide mononitrate  30 mg Oral Daily  . lisinopril  10 mg Oral Daily  . multivitamin with minerals  1 tablet Oral Daily  . mupirocin ointment   Nasal BID  . predniSONE  3 mg Oral Q breakfast  . pyridostigmine  60 mg Oral Daily  . rosuvastatin  5 mg Oral Daily  . timolol  1 drop Both Eyes BID    SUBJECTIVE:  Afebrile overnight. WBC count of 12.1 and creatine of 1.17 which has been stable.   Feeling better today with mild improvements in range of motion and decreased pain. Ready to go home.   Allergies  Allergen Reactions  . Phenergan [Promethazine Hcl] Swelling and Other (See Comments)    Cannot be  combined with Zofran because swelling of the eyes resulted and patient became very restless-  . Zofran [Ondansetron Hcl] Swelling and Other (See Comments)    Cannot be combined with Phenergan because swelling of the eyes resulted and patient became very restless-  . Sulfamethoxazole Rash and Itching     Review of Systems: Review of Systems  Constitutional: Negative for chills and fever.  Respiratory: Negative for cough, shortness of breath and wheezing.   Cardiovascular: Negative for chest pain and leg swelling.  Gastrointestinal: Negative for abdominal pain, constipation, diarrhea, nausea and vomiting.  Musculoskeletal: Positive for joint pain (Sternoclavicular).  Skin: Negative for rash.      OBJECTIVE: Vitals:   11/04/17 1956 11/04/17 2336 11/05/17 0319 11/05/17 0821  BP: (!) 148/83 (!) 145/87 (!) 159/81 (!) 159/72  Pulse: 78 79 63 (!) 55  Resp: 20 18 18 16   Temp: 98.4 F (36.9 C) 98.1 F (36.7 C) 98 F (36.7 C) 98.7 F (37.1 C)  TempSrc: Oral Oral Oral Oral  SpO2: 97% 98% 99% 96%  Weight:      Height:       Body mass index is 27 kg/m.  Physical Exam  Constitutional: [name] is oriented to person, place, and time. [name] appears well-developed and well-nourished. No distress.  Cardiovascular: Normal rate, regular rhythm, normal  heart sounds and intact distal pulses.  Pulmonary/Chest: Effort normal and breath sounds normal.  Musculoskeletal:  Sternoclavicular joint remains with tenderness and mild inflammation. Range of motion of left upper extremity improved.   Neurological: [name] is alert and oriented to person, place, and time.  Skin: Skin is warm and dry.  Psychiatric: [name] has a normal mood and affect. His behavior is normal. Judgment and thought content normal.    Lab Results Lab Results  Component Value Date   WBC 12.1 (H) 11/05/2017   HGB 11.5 (L) 11/05/2017   HCT 34.2 (L) 11/05/2017   MCV 95.5 11/05/2017   PLT 178 11/05/2017    Lab Results  Component Value Date    CREATININE 1.17 11/05/2017   BUN 21 11/05/2017   NA 136 11/05/2017   K 4.0 11/05/2017   CL 100 11/05/2017   CO2 27 11/05/2017    Lab Results  Component Value Date   ALT 37 10/31/2017   AST 25 10/31/2017   ALKPHOS 55 10/31/2017   BILITOT 1.4 (H) 10/31/2017     Microbiology: Recent Results (from the past 240 hour(s))  Blood culture (routine x 2)     Status: None   Collection Time: 10/30/17  8:39 AM  Result Value Ref Range Status   Specimen Description BLOOD RIGHT ANTECUBITAL  Final   Special Requests   Final    BOTTLES DRAWN AEROBIC AND ANAEROBIC Blood Culture results may not be optimal due to an excessive volume of blood received in culture bottles   Culture   Final    NO GROWTH 5 DAYS Performed at [facility], [place] [address] [place], [place] [address]    Report Status 11/04/2017 FINAL  Final  Blood culture (routine x 2)     Status: None   Collection Time: 10/30/17  8:40 AM  Result Value Ref Range Status   Specimen Description BLOOD LEFT HAND  Final   Special Requests   Final    BOTTLES DRAWN AEROBIC AND ANAEROBIC Blood Culture adequate volume   Culture   Final    NO GROWTH 5 DAYS Performed at [facility], [place] [address], [place], [place] [address]    Report Status 11/04/2017 FINAL  Final  Aerobic/Anaerobic Culture (surgical/deep wound)     Status: None (Preliminary result)   Collection Time: 11/03/17 12:18 PM  Result Value Ref Range Status   Specimen Description ABSCESS  Final   Special Requests LEFT STERNOCLEIDOMASTOID MUSCLE  Final   Gram Stain   Final    ABUNDANT WBC PRESENT, PREDOMINANTLY PMN NO ORGANISMS SEEN    Culture   Final    FEW UNIDENTIFIED ORGANISM Performed at [facility], [place] [address] [place],  [address]    Report Status PENDING  Incomplete     Terri Piedra, [place] [facility] [place] Pager  11/05/2017  10:20 AM   Allergies  Allergen Reactions  .  Phenergan [Promethazine Hcl] Swelling and Other (See Comments)    Cannot be combined with Zofran because swelling of the eyes resulted and patient became very restless-  . Zofran [Ondansetron Hcl] Swelling and Other (See Comments)    Cannot be combined with Phenergan because swelling of the eyes resulted and patient became very restless-  . Sulfamethoxazole Rash and Itching    OPAT Orders Discharge antibiotics: ceftriaxone 2g ivpb qday  Duration: 39 days End Date: 12-14-17  [facility] Per Protocol:  Labs weekly while on  IV antibiotics: _x_ CBC with differential __ BMP _x_ CMP _x_ CRP _x_ ESR __ Vancomycin trough __ CK  _x_ Please pull PIC at completion of IV antibiotics __ Please leave PIC in place until doctor has seen patient or been notified  Fax weekly labs to [phone]  Clinic Follow Up Appt: ID 4-5 weeks.

## 2017-11-05 NOTE — Progress Notes (Signed)
ANTICOAGULATION CONSULT NOTE - Follow Up Consult  Pharmacy Consult for Heparin Indication: history of pulmonary embolus and DVT (Eliquis on hold)  Assessment: 77 yo M continues on IV heparin while apixaban is on hold PTT = 66 seconds, Heparin level = 0.62 CBC stable  Goal of Therapy:  aPTT 66-102 sec Heparin level 0.3-0.7 units/ml Monitor platelets by anticoagulation protocol: Yes  Plan:  Continue heparin at 800 units / hr Follow up AM labs  Follow up plans to resume Eliquis?      Allergies  Allergen Reactions  . Phenergan [Promethazine Hcl] Swelling and Other (See Comments)    Cannot be combined with Zofran because swelling of the eyes resulted and patient became very restless-  . Zofran [Ondansetron Hcl] Swelling and Other (See Comments)    Cannot be combined with Phenergan because swelling of the eyes resulted and patient became very restless-  . Sulfamethoxazole Rash and Itching    Labs: Recent Labs    11/03/17 0356 11/04/17 0557 11/05/17 0527  HGB 10.0* 10.7* 11.5*  HCT 29.2* 31.5* 34.2*  PLT 170 171 178  APTT 100* 95* 66*  HEPARINUNFRC 0.98* 0.97* 0.62  CREATININE 1.28* 1.33* 1.17    Estimated Creatinine Clearance: 47.9 mL/min (by C-G formula based on SCr of 1.17 mg/dL).   Thank you for allowing pharmacy to be a part of this patient's care. Anette Guarneri, PharmD [phone] Please check AMION for all [place] numbers 11/05/2017 10:25 AM

## 2017-11-05 NOTE — Care Management Note (Signed)
Case Management Note  Patient Details  Name: DEEGAN VALENTINO MRN: [id number] Date of Birth: 1940-09-05  Subjective/Objective:     Pt admitted with septic arthritis. He is from home with spouse. Pt has; walker, shower chair, elevated commode at home. PCP:    Dr Loanne Drilling            Action/Plan: Plan is for d/c home with IV antibiotics. CM provided choice and they selected [facility]. Tiffany with [facility] notified and accepted the referral.  Pam with [facility] also notified so she can meet with the patient and wife for education on IV administration.  CM continuing to follow.  Expected Discharge Date:                  Expected Discharge Plan:  [place]  In-House Referral:     Discharge planning Services  CM Consult  Post Acute Care Choice:  Home Health Choice offered to:  Patient, Spouse  DME Arranged:    DME Agency:     HH Arranged:  RN, PT, OT HH Agency:  [facility] (now [facility])  Status of Service:  In process, will continue to follow  If discussed at Long Length of Stay Meetings, dates discussed:    Additional Comments:  Pollie Friar, RN 11/05/2017, 2:35 PM

## 2017-11-05 NOTE — Consult Note (Addendum)
[address]       [place] [address]             [phone]    Reason for Consult: [facility] joint abscess Referring Physician: Hospitalist  NIEL PERETTI is an 77 y.o. male.  HPI: The patient is a 77 year old male with a history of diabetes, myasthenia gravis, CML in remission, spinal stenosis, coronary artery disease, recent pulmonary embolism with DVT on chronic anticoagulation with Eliquis who presented to the emergency department with sudden onset of neck and shoulder pain beginning approximately 10 PM the previous evening.  He described the pain as severe and would worsen with any activity or movement.  The pain would also radiate into his shoulder making him very uncomfortable with any movement.  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  Anticoagulation was placed on hold and was transitioned to heparin as a bridge.  Pain was improved with Decadron.  Pain however in the left sternoclavicular region continued to worsen.  An MRI of the sternum with and without contrast was obtained.  The MRI confirmed findings consistent with infection of the SCM/[place] joint.  We are asked to see the patient in consultation to evaluate for further management/possible debridement and drainage.  Infectious disease consultation has been obtained as well to assist with antibiotic management.  Further neurosurgical intervention on the C-spine is on hold until this infection can be treated.  Past Medical History:  Diagnosis Date  . Bruises easily    d/t being on Eliquis  . Carotid artery disease ([place])    a. Duplex 05/2014: 01-60% RICA, 1-09% LICA, elevated velocities in right subclavian artery, normal left subclavian artery..  . Chronic back pain    HNP  . Chronic kidney disease (CKD)   . Claustrophobia    takes Ativan as needed  . 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 Coronary atherosclerosis of unspecified type of vessel, native or graft    a. cath in 2003 showed 10-20% LM, scattered 20% prox-mid LAD, 30% more distal LAD, 50-60% stenosis of LAD towards apex, 20% Cx, 30% dRCA, focal 95% stenosis in smaller of 2 branches of PDA, EF 60-65%.  . Diabetes mellitus ([place])   . Diarrhea    takes Imodium daily as needed  . Diverticulosis   . Diverticulosis of colon (without mention of hemorrhage)   . DJD (degenerative joint disease)   . DVT (deep venous thrombosis) ([place]) 07/2011  . Esophageal stricture   . Essential hypertension    takes Imdur and Lisinopril daily  . Gastric polyps    benign  . GERD (gastroesophageal reflux disease)    takes Nexium daily  . Glaucoma    uses eye drops  . History of bronchitis    not sure when the last time  . History of colon polyps    benign  . History of kidney stones   . History of kidney stones   . Insomnia    takes Melatonin nightly as needed  . Mixed hyperlipidemia    takes Crestor daily  . Myasthenia gravis ([place])    uses prednisone  . Osteoporosis 2016  . Peripheral edema    takes Lasix daily  . Pituitary tumor    checks Dr[facility]Walden at [facility] checks it every Jan   . Pneumonia 01/2016  . PONV (postoperative nausea  and vomiting)   . Ptosis   . Pulmonary embolus ([place]) 2012  . Stroke [facility]) 06/2015    Past Surgical History:  Procedure Laterality Date  . ABDOMINAL AORTOGRAM W/LOWER EXTREMITY N/A 11/18/2016   Procedure: ABDOMINAL AORTOGRAM W/LOWER EXTREMITY;  Surgeon: [name], MD;  Location: [place] CV [facility];  Service: Cardiovascular;  Laterality: N/A;  . BACK SURGERY     x2, lumbar and cervical  . CARDIAC CATHETERIZATION    . CATARACT EXTRACTION Bilateral 12/12  . COLONOSCOPY    . ESOPHAGOGASTRODUODENOSCOPY ENDOSCOPY  04/2015  . IR GENERIC HISTORICAL  03/03/2016   IR IVC FILTER PLMT / S&I [name] GUID/MOD SED 03/03/2016 [name], MD [facility]INTER[facility] RAD  . IR GENERIC HISTORICAL  04/09/2016    IR RADIOLOGIST EVAL & MGMT 04/09/2016 [name], MD [facility] INTER[facility] RAD  . IR GENERIC HISTORICAL  04/30/2016   IR IVC FILTER RETRIEVAL / S&I [name] GUID/MOD SED 04/30/2016 [name], MD [facility]-INTERV RAD  . LITHOTRIPSY    . LUMBAR LAMINECTOMY/DECOMPRESSION MICRODISCECTOMY Right 09/14/2012   Procedure: Right Lumbar three-four, four-five, Lumbar five-Sacral one decompressive laminectomy;  Surgeon: [name], MD;  Location: [place] [facility];  Service: Neurosurgery;  Laterality: Right;  . LUMBAR LAMINECTOMY/DECOMPRESSION MICRODISCECTOMY Left 03/11/2016   Procedure: Left Lumbar Two-ThreeMicrodiscectomy;  Surgeon: [name], MD;  Location: [place];  Service: Neurosurgery;  Laterality: Left;  . PERIPHERAL VASCULAR INTERVENTION  11/18/2016   Procedure: PERIPHERAL VASCULAR INTERVENTION;  Surgeon: [name], MD;  Location: [place] [facility];  Service: Cardiovascular;;  Left popliteal  . SHOULDER SURGERY Left 05/07/2009  . TONSILLECTOMY      Family History  Problem Relation Age of Onset  . Heart disease Mother   . Heart attack Mother   . Stroke Mother   . Heart disease Father   . Heart attack Father   . Stroke Father   . Breast cancer Sister        Twin   . Heart attack Sister   . Dementia Sister   . Heart disease Sister   . Heart attack Sister   . Clotting disorder Sister   . Heart attack Sister   . Colon cancer Neg Hx     Social History:  reports that he has never smoked. He has never used smokeless tobacco. He reports that he does not drink alcohol or use drugs.  Allergies:  Allergies  Allergen Reactions  . Phenergan [Promethazine Hcl] Swelling and Other (See Comments)    Cannot be combined with Zofran because swelling of the eyes resulted and patient became very restless-  . Zofran [Ondansetron Hcl] Swelling and Other (See Comments)    Cannot be combined with Phenergan because swelling of the eyes resulted and patient became very restless-  . Sulfamethoxazole Rash and Itching     Medications:  Prior to Admission:  Medications Prior to Admission  Medication Sig Dispense Refill Last Dose  . brimonidine-timolol (COMBIGAN) 0.2-0.5 % ophthalmic solution Place 1 drop into both eyes every 12 (twelve) hours.   10/29/2017 at Unknown time  . cholecalciferol (VITAMIN D) 1000 units tablet Take 1,000 Units by mouth daily.    10/29/2017 at Unknown time  . ELIQUIS 5 MG TABS tablet TAKE 1 TABLET (5 MG TOTAL) BY MOUTH 2 (TWO) TIMES DAILY. 60 tablet 3 10/29/2017 at 2130  . furosemide (LASIX) 20 MG tablet Take 1 tablet (20 mg total) by mouth daily. 90 tablet 3 10/29/2017 at Unknown time  . imatinib (GLEEVEC) 100 MG tablet  Take 300 mg by mouth daily at 12 noon. Take with meals and large glass of water.Caution:Chemotherapy    10/29/2017 at Unknown time  . isosorbide mononitrate (IMDUR) 30 MG 24 hr tablet Take 1 tablet (30 mg total) by mouth daily. 90 tablet 3 10/29/2017 at Unknown time  . lidocaine (LIDODERM) 5 % Place 1 patch onto the skin daily as needed (for back pain). Remove & Discard patch within 12 hours or as directed by MD    Past Week at Unknown time  . lisinopril (PRINIVIL,ZESTRIL) 10 MG tablet Take 1 tablet (10 mg total) by mouth daily. 30 tablet 11 10/29/2017 at Unknown time  . loperamide (IMODIUM) 2 MG capsule Take 2-4 mg by mouth 3 (three) times daily as needed for diarrhea or loose stools.    Past Month at Unknown time  . Multiple Vitamin (MULTIVITAMIN) tablet Take 1 tablet by mouth daily.   10/29/2017 at Unknown time  . oxyCODONE (OXY IR/ROXICODONE) 5 MG immediate release tablet Take 1 tablet (5 mg total) by mouth every 6 (six) hours as needed for moderate pain or severe pain. 30 tablet 0 Past Week at Unknown time  . predniSONE (DELTASONE) 1 MG tablet Take 3 tablets (3 mg total) by mouth daily with breakfast. 270 tablet 1 10/29/2017 at Unknown time  . pyridostigmine (MESTINON) 60 MG tablet Take 60 mg by mouth daily.   10/29/2017 at Unknown time  . ranitidine (ZANTAC) 150 MG tablet  Take 150 mg by mouth daily at 12 noon.    10/29/2017 at Unknown time  . rosuvastatin (CRESTOR) 10 MG tablet Take 5 mg by mouth daily.   10/29/2017 at Unknown time  . esomeprazole (NEXIUM) 40 MG capsule Take 1 capsule (40 mg total) by mouth daily. (Patient not taking: Reported on 10/30/2017) 30 capsule 0 Not Taking at Unknown time  . orphenadrine (NORFLEX) 100 MG tablet Take 1 tablet (100 mg total) by mouth 2 (two) times daily. (Patient not taking: Reported on 10/30/2017) 30 tablet 0 Completed Course at Unknown time    Scheduled Meds: . brimonidine  1 drop Both Eyes Q12H  . cholecalciferol  1,000 Units Oral Daily  . famotidine  20 mg Oral BID  . furosemide  20 mg Oral Daily  . hydrALAZINE  10 mg Intravenous Once  . imatinib  300 mg Oral Q1200  . insulin aspart  0-9 Units Subcutaneous TID WC  . insulin detemir  20 Units Subcutaneous Daily  . isosorbide mononitrate  30 mg Oral Daily  . lisinopril  10 mg Oral Daily  . multivitamin with minerals  1 tablet Oral Daily  . mupirocin ointment   Nasal BID  . predniSONE  3 mg Oral Q breakfast  . pyridostigmine  60 mg Oral Daily  . rosuvastatin  5 mg Oral Daily  . timolol  1 drop Both Eyes BID   Continuous Infusions: . cefTRIAXone (ROCEPHIN)  IV 2 g (11/05/17 0855)  . heparin 800 Units/hr (11/04/17 0858)  . vancomycin Stopped (11/05/17 0701)   PRN Meds:.acetaminophen, hydrALAZINE, HYDROmorphone (DILAUDID) injection, lidocaine, loperamide, LORazepam, ondansetron **OR** ondansetron (ZOFRAN) IV, oxyCODONE   Results for orders placed or performed during the hospital encounter of 10/30/17 (from the past 48 hour(s))  Aerobic/Anaerobic Culture (surgical/deep wound)     Status: None (Preliminary result)   Collection Time: 11/03/17 12:18 PM  Result Value Ref Range   Specimen Description ABSCESS    Special Requests LEFT STERNOCLEIDOMASTOID MUSCLE    Gram Stain      ABUNDANT  WBC PRESENT, PREDOMINANTLY PMN NO ORGANISMS SEEN    Culture      FEW  UNIDENTIFIED ORGANISM Performed at [facility], [place] [address], [place], [place] [address]    Report Status PENDING   Glucose, capillary     Status: Abnormal   Collection Time: 11/03/17  4:58 PM  Result Value Ref Range   Glucose-Capillary 258 (H) 70 - 99 mg/dL   Comment 1 Notify RN    Comment 2 Document in Chart   Glucose, capillary     Status: Abnormal   Collection Time: 11/03/17  9:50 PM  Result Value Ref Range   Glucose-Capillary 219 (H) 70 - 99 mg/dL  Glucose, capillary     Status: Abnormal   Collection Time: 11/03/17 10:28 PM  Result Value Ref Range   Glucose-Capillary 211 (H) 70 - 99 mg/dL  Heparin level (unfractionated)     Status: Abnormal   Collection Time: 11/04/17  5:57 AM  Result Value Ref Range   Heparin Unfractionated 0.97 (H) 0.30 - 0.70 IU/mL    Comment: (NOTE) If heparin results are below expected values, and patient dosage has  been confirmed, suggest follow up testing of antithrombin III levels. Performed at [facility], [place] [address], [place], [place] [address]   APTT     Status: Abnormal   Collection Time: 11/04/17  5:57 AM  Result Value Ref Range   aPTT 95 (H) 24 - 36 seconds    Comment:        IF BASELINE aPTT IS ELEVATED, SUGGEST PATIENT RISK ASSESSMENT BE USED TO DETERMINE APPROPRIATE ANTICOAGULANT THERAPY. Performed at [facility], [place] [address], [place], [place] [address]   CBC     Status: Abnormal   Collection Time: 11/04/17  5:57 AM  Result Value Ref Range   WBC 12.7 (H) 4.0 - 10.5 K/uL   RBC 3.38 (L) 4.22 - 5.81 MIL/uL   Hemoglobin 10.7 (L) 13.0 - 17.0 g/dL   HCT 31.5 (L) 39.0 - 52.0 %   MCV 93.2 78.0 - 100.0 fL   MCH 31.7 26.0 - 34.0 pg   MCHC 34.0 30.0 - 36.0 g/dL   RDW 14.0 11.5 - 15.5 %   Platelets 171 150 - 400 K/uL    Comment: Performed at [facility], [place] [address], [place], [place] [address]  Basic metabolic panel     Status: Abnormal   Collection Time: 11/04/17  5:57 AM  Result Value Ref Range    Sodium 136 135 - 145 mmol/L   Potassium 3.9 3.5 - 5.1 mmol/L   Chloride 101 98 - 111 mmol/L   CO2 24 22 - 32 mmol/L   Glucose, Bld 163 (H) 70 - 99 mg/dL   BUN 22 8 - 23 mg/dL   Creatinine, Ser 1.33 (H) 0.61 - 1.24 mg/dL   Calcium 8.5 (L) 8.9 - 10.3 mg/dL   GFR calc non Af Amer 49 (L) >60 mL/min   GFR calc Af Amer 57 (L) >60 mL/min    Comment: (NOTE) The eGFR has been calculated using the CKD EPI equation. This calculation has not been validated in all clinical situations. eGFR's persistently <60 mL/min signify possible Chronic Kidney Disease.    Anion gap 11 5 - 15    Comment: Performed at [facility] [address], [place], [place] [address]  Magnesium     Status: None   Collection Time: 11/04/17  5:57 AM  Result Value Ref  Range   Magnesium 2.1 1.7 - 2.4 mg/dL    Comment: Performed at [facility] [address] [place], [place] [address]  Glucose, capillary     Status: Abnormal   Collection Time: 11/04/17  6:09 AM  Result Value Ref Range   Glucose-Capillary 159 (H) 70 - 99 mg/dL   Comment 1 Notify RN    Comment 2 Document in Chart   Glucose, capillary     Status: Abnormal   Collection Time: 11/04/17 11:51 AM  Result Value Ref Range   Glucose-Capillary 181 (H) 70 - 99 mg/dL  Glucose, capillary     Status: Abnormal   Collection Time: 11/04/17  4:30 PM  Result Value Ref Range   Glucose-Capillary 168 (H) 70 - 99 mg/dL  Glucose, capillary     Status: Abnormal   Collection Time: 11/04/17  9:13 PM  Result Value Ref Range   Glucose-Capillary 190 (H) 70 - 99 mg/dL   Comment 1 Notify RN    Comment 2 Document in Chart   Heparin level (unfractionated)     Status: None   Collection Time: 11/05/17  5:27 AM  Result Value Ref Range   Heparin Unfractionated 0.62 0.30 - 0.70 IU/mL    Comment: (NOTE) If heparin results are below expected values, and patient dosage has  been confirmed, suggest follow up testing of antithrombin III levels. Performed at [facility], [place] [address] [place], [place] [address]   CBC     Status: Abnormal   Collection Time: 11/05/17  5:27 AM  Result Value Ref Range   WBC 12.1 (H) 4.0 - 10.5 K/uL   RBC 3.58 (L) 4.22 - 5.81 MIL/uL   Hemoglobin 11.5 (L) 13.0 - 17.0 g/dL   HCT 34.2 (L) 39.0 - 52.0 %   MCV 95.5 78.0 - 100.0 fL   MCH 32.1 26.0 - 34.0 pg   MCHC 33.6 30.0 - 36.0 g/dL   RDW 14.3 11.5 - 15.5 %   Platelets 178 150 - 400 K/uL    Comment: Performed at [facility] [address] [place], [place] [address]  Basic metabolic panel     Status: Abnormal   Collection Time: 11/05/17  5:27 AM  Result Value Ref Range   Sodium 136 135 - 145 mmol/L   Potassium 4.0 3.5 - 5.1 mmol/L   Chloride 100 98 - 111 mmol/L   CO2 27 22 - 32 mmol/L   Glucose, Bld 91 70 - 99 mg/dL   BUN 21 8 - 23 mg/dL   Creatinine, Ser 1.17 0.61 - 1.24 mg/dL   Calcium 8.9 8.9 - 10.3 mg/dL   GFR calc non Af Amer 57 (L) >60 mL/min   GFR calc Af Amer >60 >60 mL/min    Comment: (NOTE) The eGFR has been calculated using the CKD EPI equation. This calculation has not been validated in all clinical situations. eGFR's persistently <60 mL/min signify possible Chronic Kidney Disease.    Anion gap 9 5 - 15    Comment: Performed at [facility] [address] [place], [place] [address]  Magnesium     Status: None   Collection Time: 11/05/17  5:27 AM  Result Value Ref Range   Magnesium 2.0 1.7 - 2.4 mg/dL    Comment: Performed at [facility] [address], [place],  [address]  APTT     Status: Abnormal   Collection Time: 11/05/17  5:27 AM  Result  Value Ref Range   aPTT 66 (H) 24 - 36 seconds    Comment:        IF BASELINE aPTT IS ELEVATED, SUGGEST PATIENT RISK ASSESSMENT BE USED TO DETERMINE APPROPRIATE ANTICOAGULANT THERAPY. Performed at [facility], [place] [address], [place], [place] [address]   Glucose, capillary     Status: None   Collection Time: 11/05/17  6:10 AM  Result Value Ref Range    Glucose-Capillary 94 70 - 99 mg/dL   Comment 1 Notify RN    Comment 2 Document in Chart    *Note: Due to a large number of results and/or encounters for the requested time period, some results have not been displayed. A complete set of results can be found in Results Review.    US Guided Needle Placement  Result Date: 11/03/2017 INDICATION: 77 year old male with a history of left sternocleidomastoid abscess/phlegmon related to a presumed septic left sternoclavicular joint. He presents for aspiration EXAM: IR ULTRASOUND GUIDED ASPIRATION/DRAINAGE MEDICATIONS: The patient is currently admitted to the hospital and receiving intravenous antibiotics. The antibiotics were administered within an appropriate time frame prior to the initiation of the procedure. ANESTHESIA/SEDATION: None COMPLICATIONS: None PROCEDURE: Informed written consent was obtained from the patient after a thorough discussion of the procedural risks, benefits and alternatives. All questions were addressed. Maximal Sterile Barrier Technique was utilized including caps, mask, sterile gowns, sterile gloves, sterile drape, hand hygiene and skin antiseptic. A timeout was performed prior to the initiation of the procedure. Patient positioned supine position on the ultrasound table. Ultrasound images of the left sternocleidomastoid were performed with images stored sent to PACs. Patient is prepped and draped in the usual sterile fashion. 1% lidocaine was used for local anesthesia. 18 gauge needle was advanced under ultrasound guidance into the small abscess within the muscle. Approximately 1 cc of fluid aspirated for culture. A Yueh needle/catheter was then placed under ultrasound with the catheter centered within the fluid collection. Again attempt was performed for more volume with approximately 1 cc fluid removed. Two samples were sent to the lab for analysis. Sterile bandage was placed. Final images were stored. Patient tolerated the procedure  well and remained hemodynamically stable throughout. No complications were encountered and no significant blood loss. IMPRESSION: Status post ultrasound-guided aspirate of left sternoclavicular phlegmon/abscess with small sample retrieved for diagnostic purpose. The majority was discovered to be phlegmon and not frank abscess. Signed, Dulcy Fanny. Dellia Nims, RPVI [facility] Radiology Electronically Signed   By: Corrie Mckusick D.O.   On: 11/03/2017 12:35    US Guided Needle Placement  Result Date: 11/03/2017 INDICATION: 77 year old male with a history of left sternocleidomastoid abscess/phlegmon related to a presumed septic left sternoclavicular joint. He presents for aspiration EXAM: IR ULTRASOUND GUIDED ASPIRATION/DRAINAGE MEDICATIONS: The patient is currently admitted to the hospital and receiving intravenous antibiotics. The antibiotics were administered within an appropriate time frame prior to the initiation of the procedure. ANESTHESIA/SEDATION: None COMPLICATIONS: None PROCEDURE: Informed written consent was obtained from the patient after a thorough discussion of the procedural risks, benefits and alternatives. All questions were addressed. Maximal Sterile Barrier Technique was utilized including caps, mask, sterile gowns, sterile gloves, sterile drape, hand hygiene and skin antiseptic. A timeout was performed prior to the initiation of the procedure. Patient positioned supine position on the ultrasound table. Ultrasound images of the left sternocleidomastoid were performed with images stored sent to PACs. Patient is prepped and draped in the usual sterile fashion. 1%  lidocaine was used for local anesthesia. 18 gauge needle was advanced under ultrasound guidance into the small abscess within the muscle. Approximately 1 cc of fluid aspirated for culture. A Yueh needle/catheter was then placed under ultrasound with the catheter centered within the fluid  collection. Again attempt was performed for more volume with approximately 1 cc fluid removed. Two samples were sent to the lab for analysis. Sterile bandage was placed. Final images were stored. Patient tolerated the procedure well and remained hemodynamically stable throughout. No complications were encountered and no significant blood loss. IMPRESSION: Status post ultrasound-guided aspirate of left sternoclavicular phlegmon/abscess with small sample retrieved for diagnostic purpose. The majority was discovered to be phlegmon and not frank abscess. Signed, Dulcy Fanny. Dellia Nims, RPVI [facility] Radiology Electronically Signed   By: Corrie Mckusick D.O.   On: 11/03/2017 12:35   Mr Larose Kells ZD Contrast  Result Date: 11/02/2017 CLINICAL DATA:  77 year old male presents with left neck pain, infection suspected. Attention left sternoclavicular joint. EXAM: MR STERNUM WITHOUT AND WITH CONTRAST TECHNIQUE: Multiplanar MR imaging of the sternum without and with IV contrast. CONTRAST:  7 mL Gadavist IV COMPARISON:  None. FINDINGS: Abnormal enhancing fluid collection centered within the left sternocleidomastoid muscle measuring at least 6.3 x 1.2 x 2 cm is identified contiguous with the left sternoclavicular joint and joint fluid. Findings are in keeping with pyomyositis of the sternocleidomastoid. Given that this collection is contiguous with the left sternoclavicular joint and joint fluid, the possibility of a septic joint is raised. No definite evidence of osteomyelitis is noted of the sternoclavicular joints. Symmetric subchondral cystic change likely degenerative in etiology is currently noted. No apparent cervical lymphadenopathy the included cervical spine. IMPRESSION: 1. Abnormal enhancing serpiginous fluid measuring 6.3 x 1.2 x 2 cm tracking within the left sternocleidomastoid muscle and contiguous with the left sternoclavicular joint is noted. Findings raise  concern for pyomyositis possibly from septic arthritis of the left sternoclavicular joint. 2. 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 No frank bone destruction or marrow signal abnormalities definitive for osteomyelitis. Electronically Signed   By: Ashley Royalty M.D.   On: 11/02/2017 17:49   [place] Ekg Site Rite  Result Date: 11/05/2017 If Site Rite image not attached, placement could not be confirmed due to current cardiac rhythm.    Review of Systems  Constitutional: Positive for malaise/fatigue. Negative for chills, diaphoresis, fever and weight loss.  HENT: Positive for hearing loss, sore throat and tinnitus. Negative for congestion, ear discharge, ear pain, nosebleeds and sinus pain.   Eyes: Negative for blurred vision, double vision, photophobia, pain, discharge and redness.  Respiratory: Negative for cough, hemoptysis, sputum production, shortness of breath and stridor.   Cardiovascular: Positive for leg swelling. Negative for chest pain, palpitations, orthopnea, claudication and PND.  Gastrointestinal: Negative for abdominal pain, blood in stool, constipation, diarrhea, heartburn, melena, nausea and vomiting.  Genitourinary: Positive for frequency and urgency. Negative for dysuria, flank pain and hematuria.  Musculoskeletal: Positive for back pain, joint pain and neck pain. Negative for falls and myalgias.       Left shoulder and arm pain  Skin: Negative for itching and rash.  Neurological: Positive for sensory change, seizures and weakness. Negative for dizziness, tingling, tremors (left leg numbness), speech change, focal weakness, loss of consciousness and headaches.       Let leg numbness and weakness wigh hip pain  Endo/Heme/Allergies: Negative for environmental allergies and polydipsia. Bruises/bleeds easily.  Psychiatric/Behavioral: Negative for depression,  hallucinations, memory loss, substance abuse and suicidal ideas. The patient is nervous/anxious. The  patient does not have insomnia.    Blood pressure (!) 159/72, pulse (!) 55, temperature 98.7 F (37.1 C), temperature source Oral, resp. rate 16, height 5' 7"  (1.702 m), weight 78.2 kg, SpO2 96 %. Physical Exam  Constitutional: He is oriented to person, place, and time. He appears well-developed and well-nourished. No distress.  HENT:  Head: Normocephalic and atraumatic.  Mouth/Throat: Oropharynx is clear and moist. No oropharyngeal exudate.  + coating on tongue  Eyes: Pupils are equal, round, and reactive to light. Conjunctivae and EOM are normal. Right eye exhibits no discharge. Left eye exhibits no discharge. No scleral icterus.  Neck: Normal range of motion. Neck supple. No JVD present. No tracheal deviation present. No thyromegaly present.  + left clavicular tenderness  Cardiovascular: Normal heart sounds and intact distal pulses. Exam reveals no gallop and no friction rub.  No murmur heard. Irregular rate and rhythm  Respiratory: Effort normal and breath sounds normal. No stridor. No respiratory distress. He has no wheezes. He has no rales. He exhibits no tenderness.  GI: Soft. Bowel sounds are normal. He exhibits no distension and no mass. There is no tenderness. There is no rebound and no guarding.  Musculoskeletal: He exhibits edema and tenderness. He exhibits no deformity.  Lymphadenopathy:    He has no cervical adenopathy.  Neurological: He is alert and oriented to person, place, and time.  Skin: Skin is warm and dry. No rash noted. He is not diaphoretic. No erythema. No pallor.  Psychiatric: He has a normal mood and affect. His behavior is normal. Judgment and thought content normal.    Assessment/Plan: 1 concern for pyomyositis Left [place] joint left neck by MRI and physical exam on admission, patient notes that 3 days of antibiotics and aspiration of left neck, pain and swelling and range of motion have significantly improved . With qick improvement with current therapy would   Continue ID recs for IV ABX and reserve surgical exploration of sternum if failure of therapy.  2 poss atrial fib, d/w hospitalist who will obtain EKG and place on telemetry 3 oral candidiasis- may require further management - defer to primary svc   John Giovanni 11/05/2017, 11:17 AM  Pager [phone]  Patient's history reviewed , patient  seen and examined xrays reviewed  , discussed findings with patient and wife . Agree with current plan of therapy.  I have seen and examined [name] and agree with the above assessment  and plan.  Grace Isaac MD Beeper [phone] Office [phone] 11/05/2017 3:16 PM

## 2017-11-05 NOTE — Progress Notes (Signed)
Peripherally Inserted Central Catheter/Midline Placement  The IV Nurse has discussed with the patient and/or persons authorized to consent for the patient, the purpose of this procedure and the potential benefits and risks involved with this procedure.  The benefits include less needle sticks, lab draws from the catheter, and the patient may be discharged home with the catheter. Risks include, but not limited to, infection, bleeding, blood clot (thrombus formation), and puncture of an artery; nerve damage and irregular heartbeat and possibility to perform a PICC exchange if needed/ordered by physician.  Alternatives to this procedure were also discussed.  B[employer] PICC patient education guide, fact sheet on infection prevention and patient information card has been provided to patient /or left at bedside.    PICC/Midline Placement Documentation        [name] 11/05/2017, 3:36 PM

## 2017-11-05 NOTE — Progress Notes (Signed)
ANTICOAGULATION CONSULT NOTE - Initial Consult  Pharmacy Consult for Apixaban Indication: H/o pulmonary embolus and DVT  Allergies  Allergen Reactions  . Phenergan [Promethazine Hcl] Swelling and Other (See Comments)    Cannot be combined with Zofran because swelling of the eyes resulted and patient became very restless-  . Zofran [Ondansetron Hcl] Swelling and Other (See Comments)    Cannot be combined with Phenergan because swelling of the eyes resulted and patient became very restless-  . Sulfamethoxazole Rash and Itching    Patient Measurements: Height: 5\' 7"  (170.2 cm) Weight: 172 lb 6.4 oz (78.2 kg) IBW/kg (Calculated) : 66.1   Vital Signs: Temp: 98 F (36.7 C) (09/27 1610) Temp Source: Oral (09/27 1610) BP: 141/82 (09/27 1610) Pulse Rate: 77 (09/27 1610)  Labs: Recent Labs    11/03/17 0356 11/04/17 0557 11/05/17 0527  HGB 10.0* 10.7* 11.5*  HCT 29.2* 31.5* 34.2*  PLT 170 171 178  APTT 100* 95* 66*  HEPARINUNFRC 0.98* 0.97* 0.62  CREATININE 1.28* 1.33* 1.17    Estimated Creatinine Clearance: 47.9 mL/min (by C-G formula based on SCr of 1.17 mg/dL).   Medical History: Past Medical History:  Diagnosis Date  . Bruises easily    d/t being on Eliquis  . Carotid artery disease ([place])    a. Duplex 05/2014: 76-16% RICA, 0-73% LICA, elevated velocities in right subclavian artery, normal left subclavian artery..  . Chronic back pain    HNP  . Chronic kidney disease (CKD)   . Claustrophobia    takes Ativan as needed  . 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 Coronary atherosclerosis of unspecified type of vessel, native or graft    a. cath in 2003 showed 10-20% LM, scattered 20% prox-mid LAD, 30% more distal LAD, 50-60% stenosis of LAD towards apex, 20% Cx, 30% dRCA, focal 95% stenosis in smaller of 2 branches of PDA, EF 60-65%.  . Diabetes mellitus ([place])   . Diarrhea    takes Imodium daily as needed  . Diverticulosis   . Diverticulosis  of colon (without mention of hemorrhage)   . DJD (degenerative joint disease)   . DVT (deep venous thrombosis) ([place]) 07/2011  . Esophageal stricture   . Essential hypertension    takes Imdur and Lisinopril daily  . Gastric polyps    benign  . GERD (gastroesophageal reflux disease)    takes Nexium daily  . Glaucoma    uses eye drops  . History of bronchitis    not sure when the last time  . History of colon polyps    benign  . History of kidney stones   . History of kidney stones   . Insomnia    takes Melatonin nightly as needed  . Mixed hyperlipidemia    takes Crestor daily  . Myasthenia gravis ([place])    uses prednisone  . Osteoporosis 2016  . Peripheral edema    takes Lasix daily  . Pituitary tumor    checks Dr[facility]Walden at [facility] checks it every Jan   . Pneumonia 01/2016  . PONV (postoperative nausea and vomiting)   . Ptosis   . Pulmonary embolus ([place]) 2012  . Stroke [facility] 06/2015  Assessment: 77 yo male with h/o PE/DVT and stroke on apixaban PTA. Patient has been on heparin drip while holding apixaban. MD now wishes to restart apixaban.   Goal of Therapy:  Monitor platelets by anticoagulation protocol: Yes   Plan:  Apixaban 5mg  BID D/C heparin at time  of first dose of apixaban Monitor for s/sx of bleeding  Camille Thau A. Levada Dy, PharmD, [place] Pager: [phone] Please utilize Amion for appropriate phone number to reach the unit pharmacist ([facility])   11/05/2017,6:45 PM

## 2017-11-06 LAB — BASIC METABOLIC PANEL
ANION GAP: 9 (ref 5–15)
BUN: 21 mg/dL (ref 8–23)
CALCIUM: 8.8 mg/dL — AB (ref 8.9–10.3)
CO2: 26 mmol/L (ref 22–32)
Chloride: 100 mmol/L (ref 98–111)
Creatinine, Ser: 1.27 mg/dL — ABNORMAL HIGH (ref 0.61–1.24)
GFR calc Af Amer: >60 mL/min (ref >60)
GFR, EST NON AFRICAN AMERICAN: 52 mL/min — AB (ref >60)
Glucose, Bld: 76 mg/dL (ref 70–99)
POTASSIUM: 4 mmol/L (ref 3.5–5.1)
SODIUM: 135 mmol/L (ref 135–145)

## 2017-11-06 LAB — CBC
HCT: 35.6 % — ABNORMAL LOW (ref 39.0–52.0)
Hemoglobin: 11.9 g/dL — ABNORMAL LOW (ref 13.0–17.0)
MCH: 31.8 pg (ref 26.0–34.0)
MCHC: 33.4 g/dL (ref 30.0–36.0)
MCV: 95.2 fL (ref 78.0–100.0)
PLATELETS: 177 10*3/uL (ref 150–400)
RBC: 3.74 MIL/uL — AB (ref 4.22–5.81)
RDW: 14.6 % (ref 11.5–15.5)
WBC: 10.7 10*3/uL — AB (ref 4.0–10.5)

## 2017-11-06 LAB — GLUCOSE, CAPILLARY
Glucose-Capillary: 70 mg/dL (ref 70–99)
Glucose-Capillary: 92 mg/dL (ref 70–99)

## 2017-11-06 LAB — MAGNESIUM: MAGNESIUM: 2 mg/dL (ref 1.7–2.4)

## 2017-11-06 MED ORDER — METOPROLOL TARTRATE 25 MG PO TABS: 12.5000 mg | ORAL_TABLET | Freq: Two times a day (BID) | ORAL | 0 refills | Status: DC

## 2017-11-06 MED ORDER — INSULIN GLARGINE 100 UNIT/ML SOLOSTAR PEN: 10.0000 [IU] | PEN_INJECTOR | Freq: Every day | SUBCUTANEOUS | 1 refills | Status: DC

## 2017-11-06 MED ORDER — BLOOD GLUCOSE MONITOR KIT: PACK | 0 refills | Status: DC

## 2017-11-06 MED ORDER — CEFTRIAXONE IV (FOR PTA / DISCHARGE USE ONLY): 2.0000 g | INTRAVENOUS | 0 refills | Status: DC

## 2017-11-06 MED ORDER — HEPARIN SOD (PORK) LOCK FLUSH 100 UNIT/ML IV SOLN
250.0000 [IU] | INTRAVENOUS | Status: AC | PRN
  Administered 2017-11-06: 250 [IU]

## 2017-11-06 NOTE — Discharge Summary (Signed)
[facility]  Physician Discharge Summary   Patient ID: [name] MRN: [id number] DOB/AGE: 09/12/1940 77 y.o.  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 Home health has been ordered for home IV antibiotics as well as PT and OT 2. Patient to follow-up with PCP.  He will need to undergo a CT scan of the neck sometime next week  DISCHARGE CONDITION: fair  Diet recommendation: As before  Filed Weights   10/30/17 1038 10/30/17 2230 10/31/17 1228  Weight: 79.4 kg 75.4 kg 78.2 kg    INITIAL HISTORY: 77 y.o.malewith medical history significant ofchronic back pain/multiple spine surgeries, myasthenia gravis, CML, recent hospitalization with stroke, spinal stenosis, coronary artery disease, pulmonary embolism with DVT on chronic anticoagulation with Eliquis, who presented to the ER with sudden onset of sharp neck and shoulder pain since early Friday 9/20am. Underwent MRI of the cervical spine in the emergency room last night which noted -spine showed Multilevel spondylosis, with areas of significant spinal stenosis at C3-4 and C4-5 due to disc herniations. Cord compression with canal diameter as narrow as 4 mm.  Initially neurosurgery was consulted.  Subsequent evaluation revealed concern for pyomyositis of the sternocleidomastoid along with septic arthritis of the right clavicular joint.  Consultants:   ID  IR  CT surgery  ENT  Procedures:   US guided aspiration L SCM 9/25   HOSPITAL COURSE:   Left Sternoclavicular Septic Arthritis with Pyomyositis of  Sternocleidomastoid - s/p US guided aspirate of L SCM (per IR, majority thought to be phlegmon and not frank abscess) -Initially started on vancomycin which was discontinued on 9/27.  Then left on ceftriaxone.   -Infectious disease was consulted.  Group B streptococcus noted on cultures.  6 weeks of treatment with ceftriaxone as recommended.  PICC line has been placed.  Home health has been arranged.   - CT surgery c/s for septic arthritis.  They recommended IV antibiotics for now.  Repeat imaging studies to see if the infection is responding. - Discussed SCM abscess with ENT, Dr. Woliki who has recommended repeat CT next week - pt will need to f/u with his PCP, Dr. Ellison for this.  Patient is already arranged for same.  Severe cervical stenosis/with radiculopathy/severe pain Pt will need to follow up with neurosurgery after discharge  History of DVT and pulmonary embolism  Hx CVA Patient cannot undergo surgery at this point in time his anticoagulation was resumed.  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  CBGs however were noted to be significantly elevated.  Thought to be steroid-induced.  Patient was started on long-acting insulin.  He will be discharged on Lantus pens.  Outpatient follow-up with PCP.  Prescription also written for glucose meter.  History of CAD/PAD - Remote history of CAD, on medical management due to numerous comorbidities - history of distal SFA stent in 11/2016, no longer on antiplatelet agents  History of CVA Stable.  Continue home medications  Irregular HR thought to be multifocal atrial tachycardia:  Started on metoprolol by previous   rounding doctors.  Stable.  Continue the same.    Overall stable.  Patient very keen on going home today.  Discussed in detail  with him and his family members.  Home health has been ordered.  Okay for discharge.  Close outpatient follow-up with PCP, neurosurgery and ENT.    PERTINENT LABS:  The results of significant diagnostics from this hospitalization (including imaging, microbiology, ancillary and laboratory) are listed below for reference.    Microbiology: Recent Results (from the past 240 hour(s))  Blood culture (routine x 2)     Status: None   Collection Time: 10/30/17  8:39 AM  Result Value Ref Range Status   Specimen Description BLOOD RIGHT ANTECUBITAL  Final   Special Requests   Final    BOTTLES DRAWN AEROBIC AND ANAEROBIC Blood Culture results may not be optimal due to an excessive volume of blood received in culture bottles   Culture   Final    NO GROWTH 5 DAYS Performed at [facility], [place] [address], [place], [place] [address]    Report Status 11/04/2017 FINAL  Final  Blood culture (routine x 2)     Status: None   Collection Time: 10/30/17  8:40 AM  Result Value Ref Range Status   Specimen Description BLOOD LEFT HAND  Final   Special Requests   Final    BOTTLES DRAWN AEROBIC AND ANAEROBIC Blood Culture adequate volume   Culture   Final    NO GROWTH 5 DAYS Performed at [facility], [place] [address], [place], [place] [address]    Report Status 11/04/2017 FINAL  Final  Aerobic/Anaerobic Culture (surgical/deep wound)     Status: None (Preliminary result)   Collection Time: 11/03/17 12:18 PM  Result Value Ref Range Status   Specimen Description ABSCESS  Final   Special Requests LEFT STERNOCLEIDOMASTOID MUSCLE  Final   Gram Stain   Final    ABUNDANT WBC PRESENT, PREDOMINANTLY PMN NO ORGANISMS SEEN Performed at [facility], [place] [address] [place], [place] [address]    Culture   Final    FEW GROUP B STREP(S.AGALACTIAE)ISOLATED TESTING AGAINST S. AGALACTIAE NOT ROUTINELY PERFORMED DUE TO PREDICTABILITY OF AMP/PEN/VAN SUSCEPTIBILITY. NO ANAEROBES ISOLATED; CULTURE IN  PROGRESS FOR 5 DAYS    Report Status PENDING  Incomplete     Labs: Basic Metabolic Panel: Recent Labs  Lab 11/02/17 0552 11/03/17 0356 11/04/17 0557 11/05/17 0527 11/06/17 0442  NA 135 136 136 136 135  K 4.0 4.2 3.9 4.0 4.0  CL 103 103 101 100 100  CO2 _0 GLUCOSE 221* 179* 163* 91 76  BUN _1 CREATININE 1.29* 1.28* 1.33* 1.17 1.27*  CALCIUM 8.4* 8.4* 8.5* 8.9 8.8*  MG  --   --  2.1 2.0 2.0   Liver Function Tests: Recent Labs  Lab 10/31/17 0646  AST 25  ALT 37  ALKPHOS 55  BILITOT 1.4*  PROT 5.4*  ALBUMIN 2.6*   CBC: Recent Labs  Lab 11/02/17 0552 11/03/17 0356 11/04/17 0557 11/05/17 0527 11/06/17 0442  WBC 15.1* 12.7* 12.7* 12.1* 10.7*  HGB 9.5* 10.0* 10.7* 11.5* 11.9*  HCT 29.2* 29.2* 31.5* 34.2* 35.6*  MCV 96.4 94.8 93.2 95.5 95.2  PLT 188 170 171 178 177    CBG: Recent Labs  Lab 11/05/17 1143 11/05/17 1607 11/05/17 2112 11/06/17 0616 11/06/17 1131  GLUCAP 136* 138* 119* 70 92     IMAGING STUDIES Ct Cervical  Spine Wo Contrast  Result Date: 10/30/2017 CLINICAL DATA:  Left-sided neck pain. Painful range of motion. Pain extends into the left hip and leg. EXAM: CT CERVICAL SPINE WITHOUT CONTRAST TECHNIQUE: Multidetector CT imaging of the cervical spine was performed without intravenous contrast. Multiplanar CT image reconstructions were also generated. COMPARISON:  CT angiogram of the neck dated 07/19/2017 FINDINGS: Alignment: Chronic 2 mm spondylolisthesis at C7-T1. Skull base and vertebrae: No acute fracture. No primary bone lesion or focal pathologic process. Soft tissues and spinal canal: No prevertebral fluid or swelling. No visible canal hematoma. Disc levels: C1-2: Chronic hypertrophy of the soft tissues adjacent to the transverse ligament posterior to the odontoid process, asymmetrically prominent to the right with slight compression of the thecal sac to the right of midline best demonstrated on image 27 of series 4. The  appearance is essentially unchanged. C2-3: Disc osteophyte complex extends into the left lateral recess and left neural foramen without severe foraminal stenosis. Moderate left facet arthritis. C3-4: Disc space narrowing. Broad-based disc osteophyte complex asymmetric to the left with narrowing of both lateral recesses severe bilateral foraminal stenosis, unchanged. Narrowing of the AP dimension of the spinal canal to approximately 7 mm, unchanged. Moderate left facet arthritis. C4-5: Disc space narrowing. Broad-based disc osteophyte complex with lobulated disc extrusion extending inferiorly behind the body of C5, essentially unchanged since the prior study. The extrusion compresses the right lateral recess and could affect the right C6 nerve. Severe chronic bilateral foraminal stenosis. Moderate bilateral facet arthritis. C5-6: Minimal broad-based disc osteophyte complex without neural impingement. Widely patent neural foramina. Minimal left facet arthritis. C6-7: Previous bilateral laminotomies. Osteophytes fuse the C6-7 vertebral bodies. Calcification of posterior longitudinal ligament narrows the AP dimension of the spinal canal. Widely patent neural foramina. C7-T1: Moderate bilateral facet arthritis. 2 mm spondylolisthesis with disc space narrowing. Broad-based disc osteophyte complex without neural impingement. C7-T1: Disc space narrowing. Moderate right facet arthritis. Moderate right foraminal stenosis. Upper chest: Negative. Other: None IMPRESSION: 1. No acute abnormality of the cervical spine. No appreciable change since the prior CT angiogram of the neck dated 07/19/2017. 2. 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 3. Chronic soft disc extrusion at C4-5 asymmetric to the right as described. Electronically Signed   By: Lorriane Shire M.D.   On: 10/30/2017 08:05   Mr Cervical Spine W Or Wo Contrast  Result Date:  10/30/2017 CLINICAL DATA:  Neck pain. Infection suspected. Diabetes. Chronic kidney disease. EXAM: MRI CERVICAL SPINE WITHOUT AND WITH CONTRAST TECHNIQUE: Multiplanar and multiecho pulse sequences of the cervical spine, to include the craniocervical junction and cervicothoracic junction, were obtained without and with intravenous contrast. CONTRAST:  Gadavist, 7 mL. COMPARISON:  CT cervical spine 10/30/2017.  CT head 07/19/2017. FINDINGS: Alignment: Anatomic except for 3 mm anterolisthesis C7-T1. Vertebrae: No worrisome osseous lesion. No evidence of diskitis, or focal bone lesion. Cord: Multilevel cord compression, described below. No abnormal cord signal. Posterior Fossa, vertebral arteries, paraspinal tissues: No tonsillar herniation. Chronic LEFT cerebellar infarct. No paraspinous effusion or signs of infection. Marked pannus extends behind the odontoid greater on the RIGHT as far cephalad as the foramen magnum. There is no significant cervicomedullary compression. Disc levels: C2-3: Annular bulge. Asymmetric facet arthropathy to the LEFT. No stenosis but LEFT C3 foraminal narrowing is noted. C3-4: Disc space narrowing. Central disc extrusion, with moderate cord flattening canal diameter 5-6 mm. Facet arthropathy. BILATERAL C4 foraminal narrowing, greater on the LEFT.  C4-5: Disc space narrowing. Central disc extrusion, caudally migrated free fragments central and to the RIGHT. Severe cord compression, canal diameter 4-5 mm. BILATERAL C5 foraminal narrowing is compounded by facet arthropathy, worse on the RIGHT. C5-6: Disc desiccation. Shallow protrusion. Uncinate spurring and facet arthropathy in LEFT result in C6 foraminal narrowing. C6-7: Disc space narrowing. Central protrusion. Slight cord flattening. No definite C7 foraminal narrowing. C7-T1: 3 mm anterolisthesis. Central disc extrusion with osseous spurring. Severe facet arthropathy. Mild stenosis without cord flattening. LEFT C8 foraminal narrowing. Post  infusion no concerning abnormal enhancement. IMPRESSION: Multilevel spondylosis, with areas of significant spinal stenosis at C3-4 and C4-5 due to disc herniations. Cord compression with canal diameter as narrow as 4 mm. No signs of spinal infection, either diskitis or septic facet joint arthritis. Marked pannus, but no cervicomedullary compression. Electronically Signed   By: John T Curnes M.D.   On: 10/30/2017 17:49   Us Guided Needle Placement  Result Date: 11/03/2017 INDICATION: 77-year-old male with a history of left sternocleidomastoid abscess/phlegmon related to a presumed septic left sternoclavicular joint. He presents for aspiration EXAM: IR ULTRASOUND GUIDED ASPIRATION/DRAINAGE MEDICATIONS: The patient is currently admitted to the hospital and receiving intravenous antibiotics. The antibiotics were administered within an appropriate time frame prior to the initiation of the procedure. ANESTHESIA/SEDATION: None COMPLICATIONS: None PROCEDURE: Informed written consent was obtained from the patient after a thorough discussion of the procedural risks, benefits and alternatives. All questions were addressed. Maximal Sterile Barrier Technique was utilized including caps, mask, sterile gowns, sterile gloves, sterile drape, hand hygiene and skin antiseptic. A timeout was performed prior to the initiation of the procedure. Patient positioned supine position on the ultrasound table. Ultrasound images of the left sternocleidomastoid were performed with images stored sent to PACs. Patient is prepped and draped in the usual sterile fashion. 1% lidocaine was used for local anesthesia. 18 gauge needle was advanced under ultrasound guidance into the small abscess within the muscle. Approximately 1 cc of fluid aspirated for culture. A Yueh needle/catheter was then placed under ultrasound with the catheter centered within the fluid collection. Again attempt was performed for more volume with approximately 1 cc fluid  removed. Two samples were sent to the lab for analysis. Sterile bandage was placed. Final images were stored. Patient tolerated the procedure well and remained hemodynamically stable throughout. No complications were encountered and no significant blood loss. IMPRESSION: Status post ultrasound-guided aspirate of left sternoclavicular phlegmon/abscess with small sample retrieved for diagnostic purpose. The majority was discovered to be phlegmon and not frank abscess. Signed, Jaime S. Wagner, DO, RPVI [facility] Electronically Signed   By: Jaime  Wagner D.O.   On: 11/03/2017 12:35   Mr Sternum W Wo Contrast  Result Date: 11/02/2017 CLINICAL DATA:  77-year-old male presents with left neck pain, infection suspected. Attention left sternoclavicular joint. EXAM: MR STERNUM WITHOUT AND WITH CONTRAST TECHNIQUE: Multiplanar MR imaging of the sternum without and with IV contrast. CONTRAST:  7 mL Gadavist IV COMPARISON:  None. FINDINGS: Abnormal enhancing fluid collection centered within the left sternocleidomastoid muscle measuring at least 6.3 x 1.2 x 2 cm is identified contiguous with the left sternoclavicular joint and joint fluid. Findings are in keeping with pyomyositis of the sternocleidomastoid. Given that this collection is contiguous with the left sternoclavicular joint and joint fluid, the possibility of a septic joint is raised. No definite evidence of osteomyelitis is noted of the sternoclavicular joints. Symmetric subchondral cystic change likely degenerative in etiology is currently   noted. No apparent cervical lymphadenopathy the included cervical spine. IMPRESSION: 1. Abnormal enhancing serpiginous fluid measuring 6.3 x 1.2 x 2 cm tracking within the left sternocleidomastoid muscle and contiguous with the left sternoclavicular joint is noted. Findings raise concern for pyomyositis possibly from septic arthritis of the left sternoclavicular joint.  2. 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 No frank bone destruction or marrow signal abnormalities definitive for osteomyelitis. Electronically Signed   By: David  Kwon M.D.   On: 11/02/2017 17:49   Us Ekg Site Rite  Result Date: 11/05/2017 If Site Rite image not attached, placement could not be confirmed due to current cardiac rhythm.   DISCHARGE EXAMINATION: Vitals:   11/05/17 2328 11/06/17 0349 11/06/17 0808 11/06/17 1224  BP: (!) 164/82 (!) 154/83 (!) 144/73 130/66  Pulse: 61 (!) 50 61 (!) 58  Resp: 18 20 18 18  Temp: 98.2 F (36.8 C) 98.1 F (36.7 C) 98.3 F (36.8 C) 98.7 F (37.1 C)  TempSrc: Oral Oral Oral Oral  SpO2: 99% 98% 96% 98%  Weight:      Height:       General appearance: alert, cooperative, appears stated age and no distress Resp: clear to auscultation bilaterally Cardio: regular rate and rhythm, S1, S2 normal, no murmur, click, rub or gallop GI: soft, non-tender; bowel sounds normal; no masses,  no organomegaly  DISPOSITION: Home with home health  Discharge Instructions    Call MD for:  difficulty breathing, headache or visual disturbances   Complete by:  As directed    Call MD for:  extreme fatigue   Complete by:  As directed    Call MD for:  persistant dizziness or light-headedness   Complete by:  As directed    Call MD for:  persistant nausea and vomiting   Complete by:  As directed    Call MD for:  severe uncontrolled pain   Complete by:  As directed    Call MD for:  temperature >100.4   Complete by:  As directed    Diet - low sodium heart healthy   Complete by:  As directed    Discharge instructions   Complete by:  As directed    Please check your blood glucose levels 3 times a day before each meal.  Maintain a log for your primary care provider.  If your blood glucose level is less than 80 in the morning please do not take your insulin.  You were cared for by a hospitalist during your hospital stay. If you have any  questions about your discharge medications or the care you received while you were in the hospital after you are discharged, you can call the unit and asked to speak with the hospitalist on call if the hospitalist that took care of you is not available. Once you are discharged, your primary care physician will handle any further medical issues. Please note that NO REFILLS for any discharge medications will be authorized once you are discharged, as it is imperative that you return to your primary care physician (or establish a relationship with a primary care physician if you do not have one) for your aftercare needs so that they can reassess your need for medications and monitor your lab values. If you do not have a primary care physician, you can call [phone] for a physician referral.   Home infusion instructions [facility] May follow [facility] Dosing Protocol; May administer Cathflo as needed to maintain patency of vascular access device.; Flushing   of vascular access device: per [facility] Protocol: 0.9% NaCl pre/post medica...   Complete by:  As directed    Instructions:  May follow [facility] Dosing Protocol   Instructions:  May administer Cathflo as needed to maintain patency of vascular access device.   Instructions:  Flushing of vascular access device: per [facility] Protocol: 0.9% NaCl pre/post medication administration and prn patency; Heparin 100 u/ml, 5ml for implanted ports and Heparin 10u/ml, 5ml for all other central venous catheters.   Instructions:  May follow [facility] Anaphylaxis Protocol for First Dose Administration in the home: 0.9% NaCl at 25-50 ml/hr to maintain IV access for protocol meds. Epinephrine 0.3 ml IV/IM PRN and Benadryl 25-50 IV/IM PRN s/s of anaphylaxis.   Instructions:  [facility] Infusion Coordinator (RN) to assist per patient IV care needs in the home PRN.   Increase activity slowly   Complete by:  As directed         Allergies as of 11/06/2017      Reactions     Phenergan [promethazine Hcl] Swelling, Other (See Comments)   Cannot be combined with Zofran because swelling of the eyes resulted and patient became very restless-   Zofran [ondansetron Hcl] Swelling, Other (See Comments)   Cannot be combined with Phenergan because swelling of the eyes resulted and patient became very restless-   Sulfamethoxazole Rash, Itching      Medication List    STOP taking these medications   esomeprazole 40 MG capsule Commonly known as:  NEXIUM   orphenadrine 100 MG tablet Commonly known as:  NORFLEX     TAKE these medications   blood glucose meter kit and supplies Kit Dispense based on patient and insurance preference. Use up to four times daily as directed. (FOR ICD-9 250.00, 250.01).   cefTRIAXone  IVPB Commonly known as:  ROCEPHIN Inject 2 g into the vein daily. Indication:  SMC abscess Last Day of Therapy:  December 14, 2017 Labs - Once weekly:  CBC/D and BMP, Labs - Every other week:  ESR and CRP   cholecalciferol 1000 units tablet Commonly known as:  VITAMIN D Take 1,000 Units by mouth daily.   COMBIGAN 0.2-0.5 % ophthalmic solution Generic drug:  brimonidine-timolol Place 1 drop into both eyes every 12 (twelve) hours.   ELIQUIS 5 MG Tabs tablet Generic drug:  apixaban TAKE 1 TABLET (5 MG TOTAL) BY MOUTH 2 (TWO) TIMES DAILY.   furosemide 20 MG tablet Commonly known as:  LASIX Take 1 tablet (20 mg total) by mouth daily.   imatinib 100 MG tablet Commonly known as:  GLEEVEC Take 300 mg by mouth daily at 12 noon. Take with meals and large glass of water.Caution:Chemotherapy   Insulin Glargine 100 UNIT/ML Solostar Pen Commonly known as:  LANTUS Inject 10 Units into the skin daily.   isosorbide mononitrate 30 MG 24 hr tablet Commonly known as:  IMDUR Take 1 tablet (30 mg total) by mouth daily.   lidocaine 5 % Commonly known as:  LIDODERM Place 1 patch onto the skin daily as needed (for back pain). Remove & Discard patch within 12  hours or as directed by MD   lisinopril 10 MG tablet Commonly known as:  PRINIVIL,ZESTRIL Take 1 tablet (10 mg total) by mouth daily.   loperamide 2 MG capsule Commonly known as:  IMODIUM Take 2-4 mg by mouth 3 (three) times daily as needed for diarrhea or loose stools.   metoprolol tartrate 25 MG tablet Commonly known as:  LOPRESSOR Take   0.5 tablets (12.5 mg total) by mouth 2 (two) times daily.   multivitamin tablet Take 1 tablet by mouth daily.   oxyCODONE 5 MG immediate release tablet Commonly known as:  Oxy IR/ROXICODONE Take 1 tablet (5 mg total) by mouth every 6 (six) hours as needed for moderate pain or severe pain.   predniSONE 1 MG tablet Commonly known as:  DELTASONE Take 3 tablets (3 mg total) by mouth daily with breakfast.   pyridostigmine 60 MG tablet Commonly known as:  MESTINON Take 60 mg by mouth daily.   ranitidine 150 MG tablet Commonly known as:  ZANTAC Take 150 mg by mouth daily at 12 noon.   rosuvastatin 10 MG tablet Commonly known as:  CRESTOR Take 5 mg by mouth daily.            Home Infusion Instuctions  (From admission, onward)         Start     Ordered   11/06/17 0000  Home infusion instructions [facility] May follow [facility] Dosing Protocol; May administer Cathflo as needed to maintain patency of vascular access device.; Flushing of vascular access device: per [facility] Protocol: 0.9% NaCl pre/post medica...    Question Answer Comment  Instructions May follow [facility] Dosing Protocol   Instructions May administer Cathflo as needed to maintain patency of vascular access device.   Instructions Flushing of vascular access device: per [facility] Protocol: 0.9% NaCl pre/post medication administration and prn patency; Heparin 100 u/ml, 5ml for implanted ports and Heparin 10u/ml, 5ml for all other central venous catheters.   Instructions May follow [facility] Anaphylaxis Protocol for First Dose Administration in the home: 0.9% NaCl at 25-50 ml/hr  to maintain IV access for protocol meds. Epinephrine 0.3 ml IV/IM PRN and Benadryl 25-50 IV/IM PRN s/s of anaphylaxis.   Instructions [facility] Infusion Coordinator (RN) to assist per patient IV care needs in the home PRN.      11/06/17 1017           Follow-up Information    Jones, David S, MD. Schedule an appointment as soon as possible for a visit in 4 week(s).   Specialty:  Neurosurgery Contact information: [address] [place] [address] [phone]        Hatcher, Jeffrey C, MD Follow up.   Specialty:  Infectious Diseases Why:  Please follow up with Dr. Hatcher as an outpatient within 4 weeks Contact information: [address] [place] [address] [phone]        Ellison, Sean, MD Follow up.   Specialty:  Endocrinology Why:  Follow up within this week for CT scan of neck. Contact information: [address] [place] [address] [phone]        Cooper, Michael, MD .   Specialty:  Cardiology Contact information: [address] [place] [address] [phone]        Wolicki, Karol, MD. Schedule an appointment as soon as possible for a visit in 1 week(s).   Specialty:  Otolaryngology Why:  once CT scan is done. Contact information: [address] [place] [address] [phone]           TOTAL DISCHARGE TIME: 35 mins     [facility] Pager [phone]  11/06/2017, 2:23 PM    

## 2017-11-06 NOTE — Progress Notes (Signed)
[place] will provide Home Infusion Pharm[facility] services for pt for home [place] in partnership with [facility] who will provide [facility] and home care services. I have connected with Joen Laura, RN with [facility].  [facility] and [facility] are prepared for weekend DC when pt is deemed ready by MD team.   If patient discharges after hours, please call [phone].   Larry Sierras 11/06/2017, 8:32 AM

## 2017-11-06 NOTE — Progress Notes (Signed)
Nsg Discharge Note  Admit Date:  10/30/2017 Discharge date: 11/06/2017   Kelli Hope Tennis to be D/C'd Home per MD order.  AVS completed.  Copy for chart, and copy for patient signed, and dated. Patient/caregiver able to verbalize understanding.  Discharge Medication: Allergies as of 11/06/2017      Reactions   Phenergan [promethazine Hcl] Swelling, Other (See Comments)   Cannot be combined with Zofran because swelling of the eyes resulted and patient became very restless-   Zofran [ondansetron Hcl] Swelling, Other (See Comments)   Cannot be combined with Phenergan because swelling of the eyes resulted and patient became very restless-   Sulfamethoxazole Rash, Itching      Medication List    STOP taking these medications   esomeprazole 40 MG capsule Commonly known as:  NEXIUM   orphenadrine 100 MG tablet Commonly known as:  NORFLEX     TAKE these medications   blood glucose meter kit and supplies Kit Dispense based on patient and insurance preference. Use up to four times daily as directed. (FOR ICD-9 250.00, 250.01).   cefTRIAXone  IVPB Commonly known as:  ROCEPHIN Inject 2 g into the vein daily. Indication:  [facility] abscess Last Day of Therapy:  December 14, 2017 Labs - Once weekly:  CBC/D and BMP, Labs - Every other week:  ESR and CRP   cholecalciferol 1000 units tablet Commonly known as:  VITAMIN D Take 1,000 Units by mouth daily.   COMBIGAN 0.2-0.5 % ophthalmic solution Generic drug:  brimonidine-timolol Place 1 drop into both eyes every 12 (twelve) hours.   ELIQUIS 5 MG Tabs tablet Generic drug:  apixaban TAKE 1 TABLET (5 MG TOTAL) BY MOUTH 2 (TWO) TIMES DAILY.   furosemide 20 MG tablet Commonly known as:  LASIX Take 1 tablet (20 mg total) by mouth daily.   imatinib 100 MG tablet Commonly known as:  GLEEVEC Take 300 mg by mouth daily at 12 noon. Take with meals and large glass of water.Caution:Chemotherapy   Insulin Glargine 100 UNIT/ML Solostar Pen Commonly  known as:  LANTUS Inject 10 Units into the skin daily.   isosorbide mononitrate 30 MG 24 hr tablet Commonly known as:  IMDUR Take 1 tablet (30 mg total) by mouth daily.   lidocaine 5 % Commonly known as:  LIDODERM Place 1 patch onto the skin daily as needed (for back pain). Remove & Discard patch within 12 hours or as directed by MD   lisinopril 10 MG tablet Commonly known as:  PRINIVIL,ZESTRIL Take 1 tablet (10 mg total) by mouth daily.   loperamide 2 MG capsule Commonly known as:  IMODIUM Take 2-4 mg by mouth 3 (three) times daily as needed for diarrhea or loose stools.   metoprolol tartrate 25 MG tablet Commonly known as:  LOPRESSOR Take 0.5 tablets (12.5 mg total) by mouth 2 (two) times daily.   multivitamin tablet Take 1 tablet by mouth daily.   oxyCODONE 5 MG immediate release tablet Commonly known as:  Oxy IR/ROXICODONE Take 1 tablet (5 mg total) by mouth every 6 (six) hours as needed for moderate pain or severe pain.   predniSONE 1 MG tablet Commonly known as:  DELTASONE Take 3 tablets (3 mg total) by mouth daily with breakfast.   pyridostigmine 60 MG tablet Commonly known as:  MESTINON Take 60 mg by mouth daily.   ranitidine 150 MG tablet Commonly known as:  ZANTAC Take 150 mg by mouth daily at 12 noon.   rosuvastatin 10 MG tablet Commonly  known as:  CRESTOR Take 5 mg by mouth daily.            Home Infusion Instuctions  (From admission, onward)         Start     Ordered   11/06/17 0000  Home infusion instructions [facility] May follow [place] Dosing Protocol; May administer Cathflo as needed to maintain patency of vascular access device.; Flushing of vascular access device: per [facility] Protocol: 0.9% NaCl pre/post medica...    Question Answer Comment  Instructions May follow [place] Dosing Protocol   Instructions May administer Cathflo as needed to maintain patency of vascular access device.   Instructions Flushing of vascular  access device: per [facility] Protocol: 0.9% NaCl pre/post medication administration and prn patency; Heparin 100 u/ml, 15m for implanted ports and Heparin 10u/ml, 537mfor all other central venous catheters.   Instructions May follow [facility] Anaphylaxis Protocol for First Dose Administration in the home: 0.9% NaCl at 25-50 ml/hr to maintain IV access for protocol meds. Epinephrine 0.3 ml IV/IM PRN and Benadryl 25-50 IV/IM PRN s/s of anaphylaxis.   Instructions [facility] Infusion Coordinator (RN) to assist per patient IV care needs in the home PRN.      11/06/17 1017          Discharge Assessment: Vitals:   11/06/17 0808 11/06/17 1224  BP: (!) 144/73 130/66  Pulse: 61 (!) 58  Resp: 18 18  Temp: 98.3 F (36.8 C) 98.7 F (37.1 C)  SpO2: 96% 98%   Skin clean, dry and intact without evidence of skin break down, no evidence of skin tears noted. IV catheter discontinued intact. Site without signs and symptoms of complications - no redness or edema noted at insertion site, patient denies c/o pain - only slight tenderness at site.  Dressing with slight pressure applied.  D/c Instructions-Education: Discharge instructions given to patient/family with verbalized understanding. D/c education completed with patient/family including follow up instructions, medication list, d/c activities limitations if indicated, with other d/c instructions as indicated by MD - patient able to verbalize understanding, all questions fully answered. Patient instructed to return to ED, call 911, or call MD for any changes in condition.  Patient escorted via [place] D/C home via private auto.  ElEda KeysRN 11/06/2017 1:15 PM

## 2017-11-08 DIAGNOSIS — M810 Age-related osteoporosis without current pathological fracture: Secondary | ICD-10-CM | POA: Diagnosis not present

## 2017-11-08 DIAGNOSIS — K5730 Diverticulosis of large intestine without perforation or abscess without bleeding: Secondary | ICD-10-CM | POA: Diagnosis not present

## 2017-11-08 DIAGNOSIS — I2510 Atherosclerotic heart disease of native coronary artery without angina pectoris: Secondary | ICD-10-CM | POA: Diagnosis not present

## 2017-11-08 DIAGNOSIS — Z7901 Long term (current) use of anticoagulants: Secondary | ICD-10-CM | POA: Diagnosis not present

## 2017-11-08 DIAGNOSIS — B951 Streptococcus, group B, as the cause of diseases classified elsewhere: Secondary | ICD-10-CM | POA: Diagnosis not present

## 2017-11-08 DIAGNOSIS — M00212 Other streptococcal arthritis, left shoulder: Secondary | ICD-10-CM | POA: Diagnosis not present

## 2017-11-08 DIAGNOSIS — C9211 Chronic myeloid leukemia, BCR/ABL-positive, in remission: Secondary | ICD-10-CM | POA: Diagnosis not present

## 2017-11-08 DIAGNOSIS — G7000 Myasthenia gravis without (acute) exacerbation: Secondary | ICD-10-CM | POA: Diagnosis not present

## 2017-11-08 DIAGNOSIS — M19011 Primary osteoarthritis, right shoulder: Secondary | ICD-10-CM | POA: Diagnosis not present

## 2017-11-08 DIAGNOSIS — Z452 Encounter for adjustment and management of vascular access device: Secondary | ICD-10-CM | POA: Diagnosis not present

## 2017-11-08 DIAGNOSIS — Z792 Long term (current) use of antibiotics: Secondary | ICD-10-CM | POA: Diagnosis not present

## 2017-11-08 DIAGNOSIS — E1151 Type 2 diabetes mellitus with diabetic peripheral angiopathy without gangrene: Secondary | ICD-10-CM | POA: Diagnosis not present

## 2017-11-08 DIAGNOSIS — N189 Chronic kidney disease, unspecified: Secondary | ICD-10-CM | POA: Diagnosis not present

## 2017-11-08 DIAGNOSIS — Z7952 Long term (current) use of systemic steroids: Secondary | ICD-10-CM | POA: Diagnosis not present

## 2017-11-08 DIAGNOSIS — E1122 Type 2 diabetes mellitus with diabetic chronic kidney disease: Secondary | ICD-10-CM | POA: Diagnosis not present

## 2017-11-08 DIAGNOSIS — M4722 Other spondylosis with radiculopathy, cervical region: Secondary | ICD-10-CM | POA: Diagnosis not present

## 2017-11-08 DIAGNOSIS — M19012 Primary osteoarthritis, left shoulder: Secondary | ICD-10-CM | POA: Diagnosis not present

## 2017-11-08 DIAGNOSIS — M4313 Spondylolisthesis, cervicothoracic region: Secondary | ICD-10-CM | POA: Diagnosis not present

## 2017-11-08 DIAGNOSIS — M60012 Infective myositis, left shoulder: Secondary | ICD-10-CM | POA: Diagnosis not present

## 2017-11-08 DIAGNOSIS — M4802 Spinal stenosis, cervical region: Secondary | ICD-10-CM | POA: Diagnosis not present

## 2017-11-08 DIAGNOSIS — F40240 Claustrophobia: Secondary | ICD-10-CM | POA: Diagnosis not present

## 2017-11-08 DIAGNOSIS — M5010 Cervical disc disorder with radiculopathy, unspecified cervical region: Secondary | ICD-10-CM | POA: Diagnosis not present

## 2017-11-08 DIAGNOSIS — I129 Hypertensive chronic kidney disease with stage 1 through stage 4 chronic kidney disease, or unspecified chronic kidney disease: Secondary | ICD-10-CM | POA: Diagnosis not present

## 2017-11-08 DIAGNOSIS — Z86718 Personal history of other venous thrombosis and embolism: Secondary | ICD-10-CM | POA: Diagnosis not present

## 2017-11-08 DIAGNOSIS — Z8673 Personal history of transient ischemic attack (TIA), and cerebral infarction without residual deficits: Secondary | ICD-10-CM | POA: Diagnosis not present

## 2017-11-09 ENCOUNTER — Telehealth: Payer: Self-pay | Admitting: Endocrinology

## 2017-11-09 DIAGNOSIS — M60012 Infective myositis, left shoulder: Secondary | ICD-10-CM | POA: Diagnosis not present

## 2017-11-09 DIAGNOSIS — M4802 Spinal stenosis, cervical region: Secondary | ICD-10-CM | POA: Diagnosis not present

## 2017-11-09 DIAGNOSIS — M0020 Other streptococcal arthritis, unspecified joint: Secondary | ICD-10-CM | POA: Diagnosis not present

## 2017-11-09 DIAGNOSIS — M00212 Other streptococcal arthritis, left shoulder: Secondary | ICD-10-CM | POA: Diagnosis not present

## 2017-11-09 DIAGNOSIS — M4722 Other spondylosis with radiculopathy, cervical region: Secondary | ICD-10-CM | POA: Diagnosis not present

## 2017-11-09 DIAGNOSIS — M01X19 Direct infection of unspecified shoulder in infectious and parasitic diseases classified elsewhere: Secondary | ICD-10-CM | POA: Diagnosis not present

## 2017-11-09 DIAGNOSIS — B951 Streptococcus, group B, as the cause of diseases classified elsewhere: Secondary | ICD-10-CM | POA: Diagnosis not present

## 2017-11-09 DIAGNOSIS — G7000 Myasthenia gravis without (acute) exacerbation: Secondary | ICD-10-CM | POA: Diagnosis not present

## 2017-11-09 NOTE — Telephone Encounter (Signed)
OK 

## 2017-11-09 NOTE — Telephone Encounter (Signed)
Need orders for home health physical therapy 2x's a wk for 6weeks. Starting today 10.1.19. Please advise. You can leave verbal consent if he don't answer the phone,

## 2017-11-09 NOTE — Telephone Encounter (Signed)
Ok to give verbal 

## 2017-11-10 LAB — AEROBIC/ANAEROBIC CULTURE (SURGICAL/DEEP WOUND)

## 2017-11-10 LAB — AEROBIC/ANAEROBIC CULTURE W GRAM STAIN (SURGICAL/DEEP WOUND)

## 2017-11-10 NOTE — Telephone Encounter (Signed)
Ok given to [facility]

## 2017-11-10 NOTE — Telephone Encounter (Signed)
lft vm for [facility] to return call

## 2017-11-11 ENCOUNTER — Encounter: Payer: Self-pay | Admitting: Endocrinology

## 2017-11-11 ENCOUNTER — Ambulatory Visit (INDEPENDENT_AMBULATORY_CARE_PROVIDER_SITE_OTHER): Payer: Medicare Other | Admitting: Endocrinology

## 2017-11-11 VITALS — BP 114/49 | HR 75 | Ht 67.0 in | Wt 166.4 lb

## 2017-11-11 DIAGNOSIS — E1143 Type 2 diabetes mellitus with diabetic autonomic (poly)neuropathy: Secondary | ICD-10-CM

## 2017-11-11 DIAGNOSIS — M009 Pyogenic arthritis, unspecified: Secondary | ICD-10-CM

## 2017-11-11 DIAGNOSIS — Z23 Encounter for immunization: Secondary | ICD-10-CM

## 2017-11-11 DIAGNOSIS — I639 Cerebral infarction, unspecified: Secondary | ICD-10-CM | POA: Diagnosis not present

## 2017-11-11 LAB — GLUCOSE, POCT (MANUAL RESULT ENTRY): POC Glucose: 177 mg/dl — AB (ref 70–99)

## 2017-11-11 NOTE — Progress Notes (Signed)
Subjective:    Patient ID: [name], male    DOB: 1940/08/12, 77 y.o.   MRN: [id number]  HPI The state of at least three ongoing medical problems is addressed today, with interval history of each noted here: septic arthritis: chest pain is slightly improved.  This is a stable problem. HTN: denies SOB.  He takes zestril and lasix, but not lopressor.  This is a stable problem. DM: he does not take insulin.  He does not have a meter.  This is a stable problem. Past Medical History:  Diagnosis Date  . Bruises easily    d/t being on Eliquis  . Carotid artery disease ([place])    a. Duplex 05/2014: 06-26% RICA, 9-48% LICA, elevated velocities in right subclavian artery, normal left subclavian artery..  . Chronic back pain    HNP  . Chronic kidney disease (CKD)   . Claustrophobia    takes Ativan as needed  . 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 Coronary atherosclerosis of unspecified type of vessel, native or graft    a. cath in 2003 showed 10-20% LM, scattered 20% prox-mid LAD, 30% more distal LAD, 50-60% stenosis of LAD towards apex, 20% Cx, 30% dRCA, focal 95% stenosis in smaller of 2 branches of PDA, EF 60-65%.  . Diabetes mellitus ([place])   . Diarrhea    takes Imodium daily as needed  . Diverticulosis   . Diverticulosis of colon (without mention of hemorrhage)   . DJD (degenerative joint disease)   . DVT (deep venous thrombosis) ([place]) 07/2011  . Esophageal stricture   . Essential hypertension    takes Imdur and Lisinopril daily  . Gastric polyps    benign  . GERD (gastroesophageal reflux disease)    takes Nexium daily  . Glaucoma    uses eye drops  . History of bronchitis    not sure when the last time  . History of colon polyps    benign  . History of kidney stones   . History of kidney stones   . Insomnia    takes Melatonin nightly as needed  . Mixed hyperlipidemia    takes Crestor daily  . Myasthenia gravis ([place])    uses prednisone  .  Osteoporosis 2016  . Peripheral edema    takes Lasix daily  . Pituitary tumor    [facility] Dr[facility]Walden at [facility] checks it every Jan   . Pneumonia 01/2016  . PONV (postoperative nausea and vomiting)   . Ptosis   . Pulmonary embolus ([place]) 2012  . Stroke [facility]) 06/2015    Past Surgical History:  Procedure Laterality Date  . ABDOMINAL AORTOGRAM W/LOWER EXTREMITY N/A 11/18/2016   Procedure: ABDOMINAL AORTOGRAM W/LOWER EXTREMITY;  Surgeon: [name], MD;  Location: [place] CV LAB;  Service: Cardiovascular;  Laterality: N/A;  . BACK SURGERY     x2, lumbar and cervical  . CARDIAC CATHETERIZATION    . CATARACT EXTRACTION Bilateral 12/12  . COLONOSCOPY    . ESOPHAGOGASTRODUODENOSCOPY ENDOSCOPY  04/2015  . IR GENERIC HISTORICAL  03/03/2016   IR IVC FILTER PLMT / S&I [name] GUID/MOD SED 03/03/2016 [name], MD [facility]INTER[facility]  . IR GENERIC HISTORICAL  04/09/2016   IR RADIOLOGIST EVAL & MGMT 04/09/2016 [name], MD [facility] INTER[facility]  . IR GENERIC HISTORICAL  04/30/2016   IR IVC FILTER RETRIEVAL / S&I [name] GUID/MOD SED 04/30/2016 [name], MD [facility]-INTERV RAD  . LITHOTRIPSY    .  LUMBAR LAMINECTOMY/DECOMPRESSION MICRODISCECTOMY Right 09/14/2012   Procedure: Right Lumbar three-four, four-five, Lumbar five-Sacral one decompressive laminectomy;  Surgeon: [name], MD;  Location: [place] NEURO ORS;  Service: Neurosurgery;  Laterality: Right;  . LUMBAR LAMINECTOMY/DECOMPRESSION MICRODISCECTOMY Left 03/11/2016   Procedure: Left Lumbar Two-ThreeMicrodiscectomy;  Surgeon: [name], MD;  Location: [place] [facility];  Service: Neurosurgery;  Laterality: Left;  . PERIPHERAL VASCULAR INTERVENTION  11/18/2016   Procedure: PERIPHERAL VASCULAR INTERVENTION;  Surgeon: [name], MD;  Location: [place] CV LAB;  Service: Cardiovascular;;  Left popliteal  . SHOULDER SURGERY Left 05/07/2009  . TONSILLECTOMY      Social History   Socioeconomic History  . Marital status: Married    Spouse name: Hassan Rowan   . Number of children: 2  . Years of education: 1-College  . Highest education level: Not on file  Occupational History  . 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 Financial resource strain: Not on file  . Food insecurity:    Worry: Not on file    Inability: Not on file  . Transportation needs:    Medical: Not on file    Non-medical: Not on file  Tobacco Use  . Smoking status: Never Smoker  . Smokeless tobacco: Never Used  Substance and Sexual Activity  . Alcohol use: No    Alcohol/week: 0.0 standard drinks  . Drug use: No  . Sexual activity: Not Currently  Lifestyle  . Physical activity:    Days per week: Not on file    Minutes per session: Not on file  . Stress: Not on file  Relationships  . Social connections:    Talks on phone: Patient refused    Gets together: Patient refused    Attends religious service: Patient refused    Active member of club or organization: Patient refused    Attends meetings of clubs or organizations: Patient refused    Relationship status: Patient refused  . Intimate partner violence:    Fear of current or ex partner: Patient refused    Emotionally abused: Patient refused    Physically abused: Patient refused    Forced sexual activity: Patient refused  Other Topics Concern  . Not on file  Social History Narrative   Lives at home w/ his wife   Patient is right handed.   Patient has 1-2 cups of caffeine daily.    Current Outpatient Medications on File Prior to Visit  Medication Sig Dispense Refill  . brimonidine-timolol (COMBIGAN) 0.2-0.5 % ophthalmic solution Place 1 drop into both eyes every 12 (twelve) hours.    . cefTRIAXone (ROCEPHIN) IVPB Inject 2 g into the vein daily. Indication:  [facility] abscess Last Day of Therapy:  December 14, 2017 Labs - Once weekly:  CBC/D and BMP, Labs - Every other week:  ESR and CRP 40 Units 0  . cholecalciferol (VITAMIN D) 1000 units tablet Take 1,000 Units by mouth  daily.     Marland Kitchen ELIQUIS 5 MG TABS tablet TAKE 1 TABLET (5 MG TOTAL) BY MOUTH 2 (TWO) TIMES DAILY. 60 tablet 3  . furosemide (LASIX) 20 MG tablet Take 1 tablet (20 mg total) by mouth daily. 90 tablet 3  . imatinib (GLEEVEC) 100 MG tablet Take 300 mg by mouth daily at 12 noon. Take with meals and large glass of water.Caution:Chemotherapy     . isosorbide mononitrate (IMDUR) 30 MG 24 hr tablet Take 1 tablet (30 mg total) by mouth daily.  90 tablet 3  . lidocaine (LIDODERM) 5 % Place 1 patch onto the skin daily as needed (for back pain). Remove & Discard patch within 12 hours or as directed by MD     . lisinopril (PRINIVIL,ZESTRIL) 10 MG tablet Take 1 tablet (10 mg total) by mouth daily. 30 tablet 11  . loperamide (IMODIUM) 2 MG capsule Take 2-4 mg by mouth 3 (three) times daily as needed for diarrhea or loose stools.     . Multiple Vitamin (MULTIVITAMIN) tablet Take 1 tablet by mouth daily.    . predniSONE (DELTASONE) 1 MG tablet Take 3 tablets (3 mg total) by mouth daily with breakfast. 270 tablet 1  . pyridostigmine (MESTINON) 60 MG tablet Take 60 mg by mouth daily.    . ranitidine (ZANTAC) 150 MG tablet Take 150 mg by mouth daily at 12 noon.     . rosuvastatin (CRESTOR) 10 MG tablet Take 5 mg by mouth daily.    . blood glucose meter kit and supplies KIT Dispense based on patient and insurance preference. Use up to four times daily as directed. (FOR ICD-9 250.00, 250.01). (Patient not taking: Reported on 11/11/2017) 1 each 0   No current facility-administered medications on file prior to visit.     Allergies  Allergen Reactions  . Phenergan [Promethazine Hcl] Swelling and Other (See Comments)    Cannot be combined with Zofran because swelling of the eyes resulted and patient became very restless-  . Zofran [Ondansetron Hcl] Swelling and Other (See Comments)    Cannot be combined with Phenergan because swelling of the eyes resulted and patient became very restless-  . Sulfamethoxazole Rash and  Itching    Family History  Problem Relation Age of Onset  . Heart disease Mother   . Heart attack Mother   . Stroke Mother   . Heart disease Father   . Heart attack Father   . Stroke Father   . Breast cancer Sister        Twin   . Heart attack Sister   . Dementia Sister   . Heart disease Sister   . Heart attack Sister   . Clotting disorder Sister   . Heart attack Sister   . Colon cancer Neg Hx     BP (!) 114/49 (BP Location: Left Arm, Patient Position: Sitting)   Pulse 75   Ht 5' 7"  (1.702 m)   Wt 166 lb 6.4 oz (75.5 kg)   SpO2 95%   BMI 26.06 kg/m   Review of Systems Denies fever.      Objective:   Physical Exam VITAL SIGNS:  See vs page.   GENERAL: no distress.  Chest wall: still tender at the left upper aspect.   LUNGS:  Clear to auscultation.   Ext: 2+ bilat leg edema.     outside test results are reviewed: Creat=1.3 Glucose=130 WBC=10.7 Na+=133 Hb=11.2  Lab Results  Component Value Date   HGBA1C 6.8 (H) 10/06/2017      Assessment & Plan:  Septic arthritis, clinically improved. HTN: well-controlled off lopressor DM: insulin is no longer needed  Patient Instructions  Let's recheck the CT scan.  you will receive a phone call, about a day and time for an appointment. Please finish the antibiotics.  You can stay off the metoprolol Here is a blood sugar meter, and some strips.  check your blood sugar once a day.  vary the time of day when you check, between before the 3 meals, and at bedtime.  also check if you have symptoms of your blood sugar being too high or too low.  Please call if it is over 200.  Please see Dr Ronnald Ramp, to follow up your back.   Best wished with your new primary care provider

## 2017-11-11 NOTE — Patient Instructions (Addendum)
Let's recheck the CT scan.  you will receive a phone call, about a day and time for an appointment. Please finish the antibiotics.  You can stay off the metoprolol Here is a blood sugar meter, and some strips.  check your blood sugar once a day.  vary the time of day when you check, between before the 3 meals, and at bedtime.  also check if you have symptoms of your blood sugar being too high or too low.  Please call if it is over 200.  Please see Dr Ronnald Ramp, to follow up your back.   Best wished with your new primary care provider

## 2017-11-12 ENCOUNTER — Telehealth: Payer: Self-pay | Admitting: Endocrinology

## 2017-11-12 ENCOUNTER — Telehealth: Payer: Self-pay

## 2017-11-12 DIAGNOSIS — B951 Streptococcus, group B, as the cause of diseases classified elsewhere: Secondary | ICD-10-CM | POA: Diagnosis not present

## 2017-11-12 DIAGNOSIS — M60012 Infective myositis, left shoulder: Secondary | ICD-10-CM | POA: Diagnosis not present

## 2017-11-12 DIAGNOSIS — M4722 Other spondylosis with radiculopathy, cervical region: Secondary | ICD-10-CM | POA: Diagnosis not present

## 2017-11-12 DIAGNOSIS — G7000 Myasthenia gravis without (acute) exacerbation: Secondary | ICD-10-CM | POA: Diagnosis not present

## 2017-11-12 DIAGNOSIS — M00212 Other streptococcal arthritis, left shoulder: Secondary | ICD-10-CM | POA: Diagnosis not present

## 2017-11-12 DIAGNOSIS — M4802 Spinal stenosis, cervical region: Secondary | ICD-10-CM | POA: Diagnosis not present

## 2017-11-12 MED ORDER — OXYCODONE HCL 5 MG PO TABS: 5.0000 mg | ORAL_TABLET | Freq: Four times a day (QID) | ORAL | 0 refills | Status: DC | PRN

## 2017-11-12 NOTE — Telephone Encounter (Signed)
Please advise on below  

## 2017-11-12 NOTE — Telephone Encounter (Signed)
Please resend Oxycodone RX to [facility]

## 2017-11-12 NOTE — Telephone Encounter (Signed)
Patient called in to see if his prescription for oxycodone was put in.

## 2017-11-12 NOTE — Telephone Encounter (Signed)
Verbal OK left on [facility] personal VM

## 2017-11-12 NOTE — Telephone Encounter (Signed)
Pt called in because his pharmacy has not received his prescription request.

## 2017-11-12 NOTE — Telephone Encounter (Signed)
Pt aware.

## 2017-11-12 NOTE — Telephone Encounter (Signed)
Error not needed

## 2017-11-12 NOTE — Telephone Encounter (Signed)
[name]/[name] needing verbal order for occupational shoulder rehab 2X a week for 4 weeks.

## 2017-11-12 NOTE — Telephone Encounter (Signed)
Called and pt is going to call his pharmacy

## 2017-11-12 NOTE — Telephone Encounter (Signed)
Error  ° °Not needed °

## 2017-11-12 NOTE — Telephone Encounter (Signed)
OK 

## 2017-11-12 NOTE — Telephone Encounter (Signed)
Ok, I have resent

## 2017-11-12 NOTE — Telephone Encounter (Signed)
I sent to [facility] yesterday.  Was I supposed to send somewhere else?

## 2017-11-15 ENCOUNTER — Telehealth: Payer: Self-pay | Admitting: Cardiovascular Disease

## 2017-11-15 ENCOUNTER — Encounter: Payer: Self-pay | Admitting: Infectious Diseases

## 2017-11-15 DIAGNOSIS — M4802 Spinal stenosis, cervical region: Secondary | ICD-10-CM | POA: Diagnosis not present

## 2017-11-15 DIAGNOSIS — M60012 Infective myositis, left shoulder: Secondary | ICD-10-CM | POA: Diagnosis not present

## 2017-11-15 DIAGNOSIS — G7000 Myasthenia gravis without (acute) exacerbation: Secondary | ICD-10-CM | POA: Diagnosis not present

## 2017-11-15 DIAGNOSIS — M01X19 Direct infection of unspecified shoulder in infectious and parasitic diseases classified elsewhere: Secondary | ICD-10-CM | POA: Diagnosis not present

## 2017-11-15 DIAGNOSIS — M0020 Other streptococcal arthritis, unspecified joint: Secondary | ICD-10-CM | POA: Diagnosis not present

## 2017-11-15 DIAGNOSIS — M4722 Other spondylosis with radiculopathy, cervical region: Secondary | ICD-10-CM | POA: Diagnosis not present

## 2017-11-15 DIAGNOSIS — M00212 Other streptococcal arthritis, left shoulder: Secondary | ICD-10-CM | POA: Diagnosis not present

## 2017-11-15 DIAGNOSIS — B951 Streptococcus, group B, as the cause of diseases classified elsewhere: Secondary | ICD-10-CM | POA: Diagnosis not present

## 2017-11-15 NOTE — Telephone Encounter (Signed)
[name] is concerned because her husband has swelling in his legs and she's worried about heart failure.  Offered patient an ASAP appointment with APP, but she refused.  Rescheduled patient to 10/28. She understands she will be called if there is a cancellation prior to that time. She was grateful for call and agrees with treatment plan.

## 2017-11-15 NOTE — Telephone Encounter (Signed)
New Message:   Patient wife calling concerning her husband. She stated that her husband got out the hospital a week ago and he has a pick line and is worried. [name] has a appt on 12/10/17 but would like a sooner appt. Please call pt

## 2017-11-16 ENCOUNTER — Telehealth: Payer: Self-pay | Admitting: Endocrinology

## 2017-11-16 DIAGNOSIS — G7000 Myasthenia gravis without (acute) exacerbation: Secondary | ICD-10-CM | POA: Diagnosis not present

## 2017-11-16 DIAGNOSIS — M00212 Other streptococcal arthritis, left shoulder: Secondary | ICD-10-CM | POA: Diagnosis not present

## 2017-11-16 DIAGNOSIS — B951 Streptococcus, group B, as the cause of diseases classified elsewhere: Secondary | ICD-10-CM | POA: Diagnosis not present

## 2017-11-16 DIAGNOSIS — M4722 Other spondylosis with radiculopathy, cervical region: Secondary | ICD-10-CM | POA: Diagnosis not present

## 2017-11-16 DIAGNOSIS — M4802 Spinal stenosis, cervical region: Secondary | ICD-10-CM | POA: Diagnosis not present

## 2017-11-16 DIAGNOSIS — M60012 Infective myositis, left shoulder: Secondary | ICD-10-CM | POA: Diagnosis not present

## 2017-11-16 NOTE — Telephone Encounter (Signed)
Please advise 

## 2017-11-16 NOTE — Telephone Encounter (Signed)
Patient states home health care took blood yesterday. Patient wants to make sure we received fax. Dr.Ellison was supposed to schedule CAT scan but have not heard anything. Please advise.

## 2017-11-16 NOTE — Telephone Encounter (Signed)
I have not received lab results.  Please forward CT question to [facility].

## 2017-11-17 ENCOUNTER — Telehealth: Payer: Self-pay | Admitting: Endocrinology

## 2017-11-17 DIAGNOSIS — M009 Pyogenic arthritis, unspecified: Secondary | ICD-10-CM

## 2017-11-17 NOTE — Telephone Encounter (Signed)
Ok, I changed to the same

## 2017-11-17 NOTE — Telephone Encounter (Signed)
Labs were placed on your desk yesterday afternoon from [facility]

## 2017-11-17 NOTE — Telephone Encounter (Signed)
[facility] wanted you to check order, stated if it is for a follow up then it needs to be the same order as last time, please advise

## 2017-11-17 NOTE — Telephone Encounter (Signed)
Yesterday, I received a HH form, but it did not have lab results

## 2017-11-17 NOTE — Telephone Encounter (Signed)
Called [facility] and informed Loma Sousa of the [employer]

## 2017-11-17 NOTE — Telephone Encounter (Signed)
[facility] called about the CT Scan Dr.Ellison put in. The radiologist said if the pt is f/u then it needs to be the same test that was done last time. So they need more information.

## 2017-11-18 ENCOUNTER — Encounter: Payer: Self-pay | Admitting: Endocrinology

## 2017-11-18 DIAGNOSIS — M4722 Other spondylosis with radiculopathy, cervical region: Secondary | ICD-10-CM | POA: Diagnosis not present

## 2017-11-18 DIAGNOSIS — M60012 Infective myositis, left shoulder: Secondary | ICD-10-CM | POA: Diagnosis not present

## 2017-11-18 DIAGNOSIS — M00212 Other streptococcal arthritis, left shoulder: Secondary | ICD-10-CM | POA: Diagnosis not present

## 2017-11-18 DIAGNOSIS — M4802 Spinal stenosis, cervical region: Secondary | ICD-10-CM | POA: Diagnosis not present

## 2017-11-18 DIAGNOSIS — B951 Streptococcus, group B, as the cause of diseases classified elsewhere: Secondary | ICD-10-CM | POA: Diagnosis not present

## 2017-11-18 DIAGNOSIS — G7000 Myasthenia gravis without (acute) exacerbation: Secondary | ICD-10-CM | POA: Diagnosis not present

## 2017-11-18 NOTE — Telephone Encounter (Signed)
Pt's wife calling and want to know why the pt's order was changed from CT to MRI. Please advise.

## 2017-11-18 NOTE — Telephone Encounter (Signed)
Pt wife stated that he would need something called in prior to procedure to help keep him calm

## 2017-11-18 NOTE — Telephone Encounter (Signed)
Ok, I have re-entered the order to reflect that request.  radiol handles it.

## 2017-11-22 ENCOUNTER — Encounter: Payer: Self-pay | Admitting: Infectious Diseases

## 2017-11-22 ENCOUNTER — Inpatient Hospital Stay: Admission: RE | Admit: 2017-11-22 | Payer: Medicare Other | Source: Ambulatory Visit

## 2017-11-22 ENCOUNTER — Telehealth: Payer: Self-pay | Admitting: Endocrinology

## 2017-11-22 DIAGNOSIS — M00212 Other streptococcal arthritis, left shoulder: Secondary | ICD-10-CM | POA: Diagnosis not present

## 2017-11-22 DIAGNOSIS — M60012 Infective myositis, left shoulder: Secondary | ICD-10-CM | POA: Diagnosis not present

## 2017-11-22 DIAGNOSIS — G7000 Myasthenia gravis without (acute) exacerbation: Secondary | ICD-10-CM | POA: Diagnosis not present

## 2017-11-22 DIAGNOSIS — B951 Streptococcus, group B, as the cause of diseases classified elsewhere: Secondary | ICD-10-CM | POA: Diagnosis not present

## 2017-11-22 DIAGNOSIS — M4722 Other spondylosis with radiculopathy, cervical region: Secondary | ICD-10-CM | POA: Diagnosis not present

## 2017-11-22 DIAGNOSIS — M4802 Spinal stenosis, cervical region: Secondary | ICD-10-CM | POA: Diagnosis not present

## 2017-11-22 NOTE — Telephone Encounter (Signed)
Please see previous message.  This is addressed at MRI

## 2017-11-22 NOTE — Telephone Encounter (Signed)
Please order

## 2017-11-22 NOTE — Telephone Encounter (Signed)
Patient is scheduled to have an MRI this week. Patient is claustrophobic and needs Dr. Loanne Drilling to prescribe something to relax patient before his MRI appointment. This is second request (patient told Dr. Loanne Drilling at last appointment). Please send Rx to [facility] in [place].

## 2017-11-23 DIAGNOSIS — B951 Streptococcus, group B, as the cause of diseases classified elsewhere: Secondary | ICD-10-CM | POA: Diagnosis not present

## 2017-11-23 DIAGNOSIS — M00212 Other streptococcal arthritis, left shoulder: Secondary | ICD-10-CM | POA: Diagnosis not present

## 2017-11-23 DIAGNOSIS — M4722 Other spondylosis with radiculopathy, cervical region: Secondary | ICD-10-CM | POA: Diagnosis not present

## 2017-11-23 DIAGNOSIS — G7000 Myasthenia gravis without (acute) exacerbation: Secondary | ICD-10-CM | POA: Diagnosis not present

## 2017-11-23 DIAGNOSIS — M4802 Spinal stenosis, cervical region: Secondary | ICD-10-CM | POA: Diagnosis not present

## 2017-11-23 DIAGNOSIS — M60012 Infective myositis, left shoulder: Secondary | ICD-10-CM | POA: Diagnosis not present

## 2017-11-24 ENCOUNTER — Telehealth: Payer: Self-pay | Admitting: Endocrinology

## 2017-11-24 MED ORDER — TRIAZOLAM 0.125 MG PO TABS: 0.1250 mg | ORAL_TABLET | Freq: Once | ORAL | 0 refills | Status: DC

## 2017-11-24 NOTE — Telephone Encounter (Signed)
Pt informed

## 2017-11-24 NOTE — Telephone Encounter (Signed)
I have sent a prescription to your pharmacy  

## 2017-11-24 NOTE — Telephone Encounter (Signed)
[facility] does not provide any sedation

## 2017-11-25 DIAGNOSIS — B951 Streptococcus, group B, as the cause of diseases classified elsewhere: Secondary | ICD-10-CM | POA: Diagnosis not present

## 2017-11-25 DIAGNOSIS — M4722 Other spondylosis with radiculopathy, cervical region: Secondary | ICD-10-CM | POA: Diagnosis not present

## 2017-11-25 DIAGNOSIS — M60012 Infective myositis, left shoulder: Secondary | ICD-10-CM | POA: Diagnosis not present

## 2017-11-25 DIAGNOSIS — M00212 Other streptococcal arthritis, left shoulder: Secondary | ICD-10-CM | POA: Diagnosis not present

## 2017-11-25 DIAGNOSIS — G7000 Myasthenia gravis without (acute) exacerbation: Secondary | ICD-10-CM | POA: Diagnosis not present

## 2017-11-25 DIAGNOSIS — M4802 Spinal stenosis, cervical region: Secondary | ICD-10-CM | POA: Diagnosis not present

## 2017-11-25 NOTE — Telephone Encounter (Signed)
Rescheduled patient for OV with Dr. Burt Knack tomorrow.

## 2017-11-26 ENCOUNTER — Ambulatory Visit (INDEPENDENT_AMBULATORY_CARE_PROVIDER_SITE_OTHER): Payer: Medicare Other | Admitting: Cardiovascular Disease

## 2017-11-26 ENCOUNTER — Encounter: Payer: Self-pay | Admitting: Cardiovascular Disease

## 2017-11-26 VITALS — BP 135/68 | HR 84 | Ht 67.0 in | Wt 172.0 lb

## 2017-11-26 DIAGNOSIS — E782 Mixed hyperlipidemia: Secondary | ICD-10-CM | POA: Diagnosis not present

## 2017-11-26 DIAGNOSIS — I739 Peripheral vascular disease, unspecified: Secondary | ICD-10-CM

## 2017-11-26 DIAGNOSIS — I5032 Chronic diastolic (congestive) heart failure: Secondary | ICD-10-CM

## 2017-11-26 DIAGNOSIS — I2510 Atherosclerotic heart disease of native coronary artery without angina pectoris: Secondary | ICD-10-CM

## 2017-11-26 DIAGNOSIS — I10 Essential (primary) hypertension: Secondary | ICD-10-CM | POA: Diagnosis not present

## 2017-11-26 DIAGNOSIS — M7989 Other specified soft tissue disorders: Secondary | ICD-10-CM | POA: Diagnosis not present

## 2017-11-26 DIAGNOSIS — I639 Cerebral infarction, unspecified: Secondary | ICD-10-CM

## 2017-11-26 MED ORDER — POTASSIUM CHLORIDE ER 10 MEQ PO TBCR: 10.0000 meq | EXTENDED_RELEASE_TABLET | Freq: Every day | ORAL | 3 refills | Status: DC

## 2017-11-26 MED ORDER — FUROSEMIDE 40 MG PO TABS: 40.0000 mg | ORAL_TABLET | Freq: Every day | ORAL | 3 refills | Status: DC

## 2017-11-26 NOTE — Patient Instructions (Addendum)
Medication Instructions:  1) Take Lasix 40 mg when you get home today 2) INCREASE LASIX to 40 mg daily 3) START KDUR 10 meq daily  Labwork: You will have labs when you come back for your next visit.  Testing/Procedures: None  Follow-Up: You have an appointment with Dr. Antionette Char assistant, Robbie Lis, on Monday, November 4 at 1130AM.   Any Other Special Instructions Will Be Listed Below (If Applicable).   For your  leg edema you  should do  the following 1. Leg elevation - I recommend the Lounge Dr. Leg rest.  See below for details  2. Salt restriction  -  Use potassium chloride instead of regular salt as a salt substitute. 3. Walk regularly 4. Compression hose - [facility] 5. Weight loss    Available on [email or web address] Or  Go to [email or web address]

## 2017-11-26 NOTE — Progress Notes (Signed)
Cardiology Office Note:    Date:  11/26/2017   ID:  [name], DOB 02-May-1940, MRN [id number]  PCP:  Tristan Shin, MD  Cardiologist:  Tristan Mocha, MD  Electrophysiologist:  None   Referring MD: Tristan Shin, MD   Chief Complaint  Patient presents with  . Leg Swelling   History of Present Illness:    [name] is a 77 y.o. male with a hx of coronary artery disease with severe distal vessel disease noted at remote cardiac catheterization.  Medical therapy was recommended.  The patient has a complicated medical history with CML in remission, myasthenia gravis recurrent DVT/PE maintained on chronic oral anticoagulation, peripheral arterial disease with intermittent claudication, and recurrent stroke.  Since I last saw him in March 2019 he was admitted with a stroke.  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  At the time of his recent hospitalization, he was treated with high-dose steroids.  He is been on chronic low-dose prednisone 3 mg daily.  He has noticed fairly severe leg and feet swelling over the last 2 weeks.  He is never had swelling this bad in the past.  He denies orthopnea or PND.  He has mild exertional dyspnea with no recent change.  He denies chest pain or pressure.  He is taking furosemide 20 mg daily but it has not helped much.  Past Medical History:  Diagnosis Date  . Bruises easily    d/t being on Eliquis  . Carotid artery disease ([place])    a. Duplex 05/2014: 29-92% RICA, 4-26% LICA, elevated velocities in right subclavian artery, normal left subclavian artery..  . Chronic back pain    HNP  . Chronic kidney disease (CKD)   . Claustrophobia    takes Ativan as needed  . 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 Coronary atherosclerosis of unspecified type of vessel, native or graft    a. cath in 2003 showed 10-20% LM, scattered 20% prox-mid LAD, 30% more distal LAD, 50-60% stenosis of LAD  towards apex, 20% Cx, 30% dRCA, focal 95% stenosis in smaller of 2 branches of PDA, EF 60-65%.  . Diabetes mellitus ([place])   . Diarrhea    takes Imodium daily as needed  . Diverticulosis   . Diverticulosis of colon (without mention of hemorrhage)   . DJD (degenerative joint disease)   . DVT (deep venous thrombosis) ([place]) 07/2011  . Esophageal stricture   . Essential hypertension    takes Imdur and Lisinopril daily  . Gastric polyps    benign  . GERD (gastroesophageal reflux disease)    takes Nexium daily  . Glaucoma    uses eye drops  . History of bronchitis    not sure when the last time  . History of colon polyps    benign  . History of kidney stones   . History of kidney stones   . Insomnia    takes Melatonin nightly as needed  . Mixed hyperlipidemia    takes Crestor daily  . Myasthenia gravis ([place])    uses prednisone  . Osteoporosis 2016  . Peripheral edema    takes Lasix daily  . Pituitary tumor    [facility]den at [facility] checks it every Jan   . Pneumonia 01/2016  . PONV (postoperative nausea and vomiting)   . Ptosis   . Pulmonary embolus ([place]) 2012  . Stroke [facility]) 06/2015    Past Surgical  History:  Procedure Laterality Date  . ABDOMINAL AORTOGRAM W/LOWER EXTREMITY N/A 11/18/2016   Procedure: ABDOMINAL AORTOGRAM W/LOWER EXTREMITY;  Surgeon: [name], MD;  Location: [place] [facility];  Service: Cardiovascular;  Laterality: N/A;  . BACK SURGERY     x2, lumbar and cervical  . CARDIAC CATHETERIZATION    . CATARACT EXTRACTION Bilateral 12/12  . COLONOSCOPY    . ESOPHAGOGASTRODUODENOSCOPY ENDOSCOPY  04/2015  . IR GENERIC HISTORICAL  03/03/2016   IR IVC FILTER PLMT / S&I [name] GUID/MOD SED 03/03/2016 [name], MD [facility]  . IR GENERIC HISTORICAL  04/09/2016   IR RADIOLOGIST EVAL & MGMT 04/09/2016 [name], MD [facility]  . IR GENERIC HISTORICAL  04/30/2016   IR IVC FILTER RETRIEVAL / S&I [name] GUID/MOD SED 04/30/2016 [name], MD  [facility]  . LITHOTRIPSY    . LUMBAR LAMINECTOMY/DECOMPRESSION MICRODISCECTOMY Right 09/14/2012   Procedure: Right Lumbar three-four, four-five, Lumbar five-Sacral one decompressive laminectomy;  Surgeon: [name], MD;  Location: [place] [facility];  Service: Neurosurgery;  Laterality: Right;  . LUMBAR LAMINECTOMY/DECOMPRESSION MICRODISCECTOMY Left 03/11/2016   Procedure: Left Lumbar Two-ThreeMicrodiscectomy;  Surgeon: [name], MD;  Location: [place];  Service: Neurosurgery;  Laterality: Left;  . PERIPHERAL VASCULAR INTERVENTION  11/18/2016   Procedure: PERIPHERAL VASCULAR INTERVENTION;  Surgeon: [name], MD;  Location: [place] [facility];  Service: Cardiovascular;;  Left popliteal  . SHOULDER SURGERY Left 05/07/2009  . TONSILLECTOMY      Current Medications: Current Meds  Medication Sig  . blood glucose meter kit and supplies KIT Dispense based on patient and insurance preference. Use up to four times daily as directed. (FOR ICD-9 250.00, 250.01).  . brimonidine-timolol (COMBIGAN) 0.2-0.5 % ophthalmic solution Place 1 drop into both eyes every 12 (twelve) hours.  . cefTRIAXone (ROCEPHIN) IVPB Inject 2 g into the vein daily. Indication:  [facility] abscess Last Day of Therapy:  December 14, 2017 Labs - Once weekly:  CBC/D and BMP, Labs - Every other week:  ESR and CRP  . cholecalciferol (VITAMIN D) 1000 units tablet Take 1,000 Units by mouth daily.   Marland Kitchen ELIQUIS 5 MG TABS tablet TAKE 1 TABLET (5 MG TOTAL) BY MOUTH 2 (TWO) TIMES DAILY.  . furosemide (LASIX) 40 MG tablet Take 1 tablet (40 mg total) by mouth daily.  Marland Kitchen imatinib (GLEEVEC) 100 MG tablet Take 300 mg by mouth daily at 12 noon. Take with meals and large glass of water.Caution:Chemotherapy   . isosorbide mononitrate (IMDUR) 30 MG 24 hr tablet Take 1 tablet (30 mg total) by mouth daily.  Marland Kitchen lidocaine (LIDODERM) 5 % Place 1 patch onto the skin daily as needed (for back pain). Remove & Discard patch within 12 hours or as directed by  MD   . lisinopril (PRINIVIL,ZESTRIL) 10 MG tablet Take 1 tablet (10 mg total) by mouth daily.  Marland Kitchen loperamide (IMODIUM) 2 MG capsule Take 2-4 mg by mouth 3 (three) times daily as needed for diarrhea or loose stools.   . Multiple Vitamin (MULTIVITAMIN) tablet Take 1 tablet by mouth daily.  Marland Kitchen oxyCODONE (OXY IR/ROXICODONE) 5 MG immediate release tablet Take 1 tablet (5 mg total) by mouth every 6 (six) hours as needed for moderate pain or severe pain.  . predniSONE (DELTASONE) 1 MG tablet Take 3 tablets (3 mg total) by mouth daily with breakfast.  . pyridostigmine (MESTINON) 60 MG tablet Take 60 mg by mouth daily.  . rosuvastatin (CRESTOR) 10 MG tablet Take 5  mg by mouth daily.  . [DISCONTINUED] furosemide (LASIX) 20 MG tablet Take 1 tablet (20 mg total) by mouth daily.  . [DISCONTINUED] ranitidine (ZANTAC) 150 MG tablet Take 150 mg by mouth daily at 12 noon.      Allergies:   Phenergan [promethazine hcl]; Zofran [ondansetron hcl]; and Sulfamethoxazole   Social History   Socioeconomic History  . Marital status: Married    Spouse name: Hassan Rowan  . Number of children: 2  . Years of education: 1-College  . Highest education level: Not on file  Occupational History  . 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 Financial resource strain: Not on file  . Food insecurity:    Worry: Not on file    Inability: Not on file  . Transportation needs:    Medical: Not on file    Non-medical: Not on file  Tobacco Use  . Smoking status: Never Smoker  . Smokeless tobacco: Never Used  Substance and Sexual Activity  . Alcohol use: No    Alcohol/week: 0.0 standard drinks  . Drug use: No  . Sexual activity: Not Currently  Lifestyle  . Physical activity:    Days per week: Not on file    Minutes per session: Not on file  . Stress: Not on file  Relationships  . Social connections:    Talks on phone: Patient refused    Gets together: Patient refused    Attends  religious service: Patient refused    Active member of club or organization: Patient refused    Attends meetings of clubs or organizations: Patient refused    Relationship status: Patient refused  Other Topics Concern  . Not on file  Social History Narrative   Lives at home w/ his wife   Patient is right handed.   Patient has 1-2 cups of caffeine daily.     Family History: The patient's family history includes Breast cancer in his sister; Clotting disorder in his sister; Dementia in his sister; Heart attack in his father, mother, sister, sister, and sister; Heart disease in his father, mother, and sister; Stroke in his father and mother. There is no history of Colon cancer.  ROS:   Please see the history of present illness.    Positive for leg pain, joint swelling, back pain.  All other systems reviewed and are negative.  EKGs/Labs/Other Studies Reviewed:    The following studies were reviewed today: Echo 07-20-2017: ------------------------------------------------------------------- Study Conclusions  - Left ventricle: The cavity size was normal. There was mild   concentric hypertrophy. Systolic function was normal. The   estimated ejection fraction was in the range of 50% to 55%. Wall   motion was normal; there were no regional wall motion   abnormalities. Doppler parameters are consistent with abnormal   left ventricular relaxation (grade 1 diastolic dysfunction).   Doppler parameters are consistent with elevated ventricular   end-diastolic filling pressure. - Aortic valve: There was mild stenosis. There was trivial   regurgitation. Valve area (VTI): 2.08 cm^2. Valve area (Vmax):   1.86 cm^2. Valve area (Vmean): 1.81 cm^2. - Mitral valve: There was mild regurgitation. - Right ventricle: The cavity size was normal. Wall thickness was   normal. Systolic function was normal. - Right atrium: The atrium was normal in size. - Pulmonary arteries: Systolic pressure was within the  normal   range. - Inferior vena cava: The vessel was normal in size. - Pericardium, extracardiac: There was no  pericardial effusion.  Impressions:  - No cardiac source of emboli was indentified.  EKG:  EKG is not ordered today.    Recent Labs: 09/27/2017: TSH 0.51 10/06/2017: Pro B Natriuretic peptide (BNP) 73.0 10/31/2017: ALT 37 11/06/2017: BUN 21; Creatinine, Ser 1.27; Hemoglobin 11.9; Magnesium 2.0; Platelets 177; Potassium 4.0; Sodium 135  Recent Lipid Panel    Component Value Date/Time   CHOL 134 07/20/2017 0525   TRIG 166 (H) 07/20/2017 0525   HDL 33 (L) 07/20/2017 0525   CHOLHDL 4.1 07/20/2017 0525   VLDL 33 07/20/2017 0525   LDLCALC 68 07/20/2017 0525   LDLDIRECT 59.8 10/12/2012 0737    Physical Exam:    VS:  BP 135/68   Pulse 84   Ht '5\' 7"'$  (1.702 m)   Wt 172 lb (78 kg)   BMI 26.94 kg/m     Wt Readings from Last 3 Encounters:  11/26/17 172 lb (78 kg)  11/11/17 166 lb 6.4 oz (75.5 kg)  10/31/17 172 lb 6.4 oz (78.2 kg)     GEN: chronically ill-appearing male in no acute distress HEENT: Normal NECK: No JVD; No carotid bruits LYMPHATICS: No lymphadenopathy CARDIAC: RRR, no murmurs, rubs, gallops RESPIRATORY:  Clear to auscultation without rales, wheezing or rhonchi  ABDOMEN: Soft, non-tender, non-distended MUSCULOSKELETAL:  2-3+ bilateral foot and pretibial edema; No deformity  SKIN: Warm and dry NEUROLOGIC:  Alert and oriented x 3 PSYCHIATRIC:  Normal affect   ASSESSMENT:    1. Chronic diastolic heart failure, NYHA class 1 ([place])   2. PAD (peripheral artery disease) ([place])   3. Coronary artery disease involving native coronary artery of native heart without angina pectoris   4. Atherosclerosis of native coronary artery of native heart without angina pectoris   5. Mixed hyperlipidemia   6. Essential hypertension   7. Leg swelling    PLAN:    In order of problems listed above:  1. I think the patient's worsening leg edema is likely multifactorial.   He has chronic diastolic heart failure.  However, he is also been treated with recent steroids.  He does not really have any other associated symptoms and his JVP appears normal.  I am going to increase his furosemide to 40 mg daily.  He is instructed to take an extra 40 mg of furosemide today.  He will also add 10 mEq of potassium daily.  We will follow-up with a metabolic panel in 2 weeks and arrange an office visit at that time.  His recent echocardiogram is reviewed as outlined above. 2. No new symptoms appreciated.  Continue medical therapy.  Chronically anticoagulated with apixaban. 3. No anginal symptoms.  Continue medical therapy. 4. As above 5. Most recent lipids reviewed with an LDL cholesterol of 82. 6. Blood pressure is controlled. 7. Diuretic adjustments as above.  The patient is given instructions regarding proper leg elevation.  He is advised to consider a lounge doctor device.   Medication Adjustments/Labs and Tests Ordered: Current medicines are reviewed at length with the patient today.  Concerns regarding medicines are outlined above.  No orders of the defined types were placed in this encounter.  Meds ordered this encounter  Medications  . furosemide (LASIX) 40 MG tablet    Sig: Take 1 tablet (40 mg total) by mouth daily.    Dispense:  90 tablet    Refill:  3    Patient Instructions  Medication Instructions:  1) Take Lasix 40 mg when you get home today 2) INCREASE LASIX to  40 mg daily  Labwork: You will have labs when you come back for your next visit.  Testing/Procedures: None  Follow-Up: You have an appointment with Dr. Antionette Char assistant, [place] Ades, on Monday, November 4 at 1130AM.   Any Other Special Instructions Will Be Listed Below (If Applicable).     If you need a refill on your cardiac medications before your next appointment, please call your pharmacy.      Signed, Tristan Mocha, MD  11/26/2017 4:36 PM    [facility]

## 2017-11-28 ENCOUNTER — Other Ambulatory Visit: Payer: Medicare Other

## 2017-11-29 DIAGNOSIS — M00212 Other streptococcal arthritis, left shoulder: Secondary | ICD-10-CM | POA: Diagnosis not present

## 2017-11-29 DIAGNOSIS — M60012 Infective myositis, left shoulder: Secondary | ICD-10-CM | POA: Diagnosis not present

## 2017-11-29 DIAGNOSIS — B951 Streptococcus, group B, as the cause of diseases classified elsewhere: Secondary | ICD-10-CM | POA: Diagnosis not present

## 2017-11-29 DIAGNOSIS — M4802 Spinal stenosis, cervical region: Secondary | ICD-10-CM | POA: Diagnosis not present

## 2017-11-29 DIAGNOSIS — G7000 Myasthenia gravis without (acute) exacerbation: Secondary | ICD-10-CM | POA: Diagnosis not present

## 2017-11-29 DIAGNOSIS — M4722 Other spondylosis with radiculopathy, cervical region: Secondary | ICD-10-CM | POA: Diagnosis not present

## 2017-11-30 ENCOUNTER — Encounter: Payer: Self-pay | Admitting: Family Medicine

## 2017-11-30 ENCOUNTER — Ambulatory Visit (INDEPENDENT_AMBULATORY_CARE_PROVIDER_SITE_OTHER): Payer: Medicare Other | Admitting: Family Medicine

## 2017-11-30 VITALS — BP 128/80 | HR 86 | Temp 98.7°F | Ht 67.0 in | Wt 171.2 lb

## 2017-11-30 DIAGNOSIS — E1159 Type 2 diabetes mellitus with other circulatory complications: Secondary | ICD-10-CM

## 2017-11-30 DIAGNOSIS — C9211 Chronic myeloid leukemia, BCR/ABL-positive, in remission: Secondary | ICD-10-CM

## 2017-11-30 DIAGNOSIS — M009 Pyogenic arthritis, unspecified: Secondary | ICD-10-CM

## 2017-11-30 DIAGNOSIS — I69398 Other sequelae of cerebral infarction: Secondary | ICD-10-CM | POA: Diagnosis not present

## 2017-11-30 DIAGNOSIS — I2583 Coronary atherosclerosis due to lipid rich plaque: Secondary | ICD-10-CM | POA: Diagnosis not present

## 2017-11-30 DIAGNOSIS — R269 Unspecified abnormalities of gait and mobility: Secondary | ICD-10-CM | POA: Diagnosis not present

## 2017-11-30 DIAGNOSIS — G7001 Myasthenia gravis with (acute) exacerbation: Secondary | ICD-10-CM

## 2017-11-30 DIAGNOSIS — I5032 Chronic diastolic (congestive) heart failure: Secondary | ICD-10-CM | POA: Diagnosis not present

## 2017-11-30 DIAGNOSIS — M48061 Spinal stenosis, lumbar region without neurogenic claudication: Secondary | ICD-10-CM

## 2017-11-30 DIAGNOSIS — I2510 Atherosclerotic heart disease of native coronary artery without angina pectoris: Secondary | ICD-10-CM

## 2017-11-30 DIAGNOSIS — I639 Cerebral infarction, unspecified: Secondary | ICD-10-CM

## 2017-11-30 DIAGNOSIS — I10 Essential (primary) hypertension: Secondary | ICD-10-CM | POA: Diagnosis not present

## 2017-11-30 NOTE — Progress Notes (Signed)
Subjective:  Patient ID: [name], male    DOB: Jul 12, 1940  Age: 77 y.o. MRN: [id number]  CC: Establish Care   HPI [name] presents for establishment of care by transfer.  Patient has several medical problems.  He is currently being treated with 2 g of Rocephin daily for an infection in his left sternoclavicular joint.  This is being followed by ID.  I will be drawing labs for monitoring every 2 weeks.  These will include weekly CBCs and BMPs.  He will have sed rates and CRPs checked every other week.  He is status post a second stroke that occurred this past June that left him dependent on a walker for ambulation.  He is seeing cardiology for management of his coronary artery disease, hypertension and heart failure.  Past medical history of CML in remission and myasthenia gravis.  He takes [place] and Mestinon for these problems respectively.  He is status post 4 back surgeries and has been told that he needs another.  This is been delayed secondary to the above-mentioned osteomyelitis.  He has diet controlled diabetes no longer taking medicines for this.  He and his wife have 2 grown children.  One lives in [place] and the other lives in [place].  They have 2 grandchildren and 2 great-grandchildren.  Patient also has a primary care doctor at the [place].  Outpatient Medications Prior to Visit  Medication Sig Dispense Refill  . blood glucose meter kit and supplies KIT Dispense based on patient and insurance preference. Use up to four times daily as directed. (FOR ICD-9 250.00, 250.01). 1 each 0  . brimonidine-timolol (COMBIGAN) 0.2-0.5 % ophthalmic solution Place 1 drop into both eyes every 12 (twelve) hours.    . cefTRIAXone (ROCEPHIN) IVPB Inject 2 g into the vein daily. Indication:  [facility] abscess Last Day of Therapy:  December 14, 2017 Labs - Once weekly:  CBC/D and BMP, Labs - Every other week:  ESR and CRP 40 Units 0  . cholecalciferol (VITAMIN D) 1000 units tablet Take 1,000 Units  by mouth daily.     Marland Kitchen ELIQUIS 5 MG TABS tablet TAKE 1 TABLET (5 MG TOTAL) BY MOUTH 2 (TWO) TIMES DAILY. 60 tablet 3  . furosemide (LASIX) 40 MG tablet Take 1 tablet (40 mg total) by mouth daily. 90 tablet 3  . imatinib (GLEEVEC) 100 MG tablet Take 300 mg by mouth daily at 12 noon. Take with meals and large glass of water.Caution:Chemotherapy     . isosorbide mononitrate (IMDUR) 30 MG 24 hr tablet Take 1 tablet (30 mg total) by mouth daily. 90 tablet 3  . lidocaine (LIDODERM) 5 % Place 1 patch onto the skin daily as needed (for back pain). Remove & Discard patch within 12 hours or as directed by MD     . lisinopril (PRINIVIL,ZESTRIL) 10 MG tablet Take 1 tablet (10 mg total) by mouth daily. 30 tablet 11  . loperamide (IMODIUM) 2 MG capsule Take 2-4 mg by mouth 3 (three) times daily as needed for diarrhea or loose stools.     . Multiple Vitamin (MULTIVITAMIN) tablet Take 1 tablet by mouth daily.    Marland Kitchen oxyCODONE (OXY IR/ROXICODONE) 5 MG immediate release tablet Take 1 tablet (5 mg total) by mouth every 6 (six) hours as needed for moderate pain or severe pain. 30 tablet 0  . potassium chloride (K-DUR) 10 MEQ tablet Take 1 tablet (10 mEq total) by mouth daily. 90 tablet 3  . predniSONE (  DELTASONE) 1 MG tablet Take 3 tablets (3 mg total) by mouth daily with breakfast. 270 tablet 1  . pyridostigmine (MESTINON) 60 MG tablet Take 60 mg by mouth daily.    . rosuvastatin (CRESTOR) 10 MG tablet Take 5 mg by mouth daily.    . triazolam (HALCION) 0.125 MG tablet Take 1 tablet (0.125 mg total) by mouth once for 1 dose. Take 30 minutes before MRI. 1 tablet 0   No facility-administered medications prior to visit.     ROS Review of Systems  Constitutional: Negative.   HENT: Negative.   Eyes: Negative for photophobia and visual disturbance.  Respiratory: Negative.   Cardiovascular: Positive for leg swelling. Negative for chest pain.  Gastrointestinal: Negative.   Endocrine: Negative for polyphagia and  polyuria.  Musculoskeletal: Positive for back pain and gait problem.  Psychiatric/Behavioral: Negative.     Objective:  BP 128/80   Pulse 86   Temp 98.7 F (37.1 C) (Oral)   Ht 5' 7"  (1.702 m)   Wt 171 lb 4 oz (77.7 kg)   SpO2 97%   BMI 26.82 kg/m   BP Readings from Last 3 Encounters:  11/30/17 128/80  11/26/17 135/68  11/11/17 (!) 114/49    Wt Readings from Last 3 Encounters:  11/30/17 171 lb 4 oz (77.7 kg)  11/26/17 172 lb (78 kg)  11/11/17 166 lb 6.4 oz (75.5 kg)    Physical Exam  Constitutional: He is oriented to person, place, and time. He appears well-developed and well-nourished. No distress.  HENT:  Head: Normocephalic and atraumatic.  Right Ear: External ear normal.  Left Ear: External ear normal.  Eyes: Right eye exhibits no discharge. Left eye exhibits no discharge. No scleral icterus.  Neck: No JVD present. No tracheal deviation present.  Pulmonary/Chest: Effort normal.  Neurological: He is alert and oriented to person, place, and time.  Skin: He is not diaphoretic.  Psychiatric: He has a normal mood and affect. His behavior is normal.    Lab Results  Component Value Date   WBC 10.7 (H) 11/06/2017   HGB 11.9 (L) 11/06/2017   HCT 35.6 (L) 11/06/2017   PLT 177 11/06/2017   GLUCOSE 76 11/06/2017   CHOL 134 07/20/2017   TRIG 166 (H) 07/20/2017   HDL 33 (L) 07/20/2017   LDLDIRECT 59.8 10/12/2012   LDLCALC 68 07/20/2017   ALT 37 10/31/2017   AST 25 10/31/2017   NA 135 11/06/2017   K 4.0 11/06/2017   CL 100 11/06/2017   CREATININE 1.27 (H) 11/06/2017   BUN 21 11/06/2017   CO2 26 11/06/2017   TSH 0.51 09/27/2017   PSA 3.93 04/24/2013   INR 1.25 07/19/2017   HGBA1C 6.8 (H) 10/06/2017   MICROALBUR 5.4 (H) 07/19/2014    Ct Cervical Spine Wo Contrast  Result Date: 10/30/2017 CLINICAL DATA:  Left-sided neck pain. Painful range of motion. Pain extends into the left hip and leg. EXAM: CT CERVICAL SPINE WITHOUT CONTRAST TECHNIQUE: Multidetector CT  imaging of the cervical spine was performed without intravenous contrast. Multiplanar CT image reconstructions were also generated. COMPARISON:  CT angiogram of the neck dated 07/19/2017 FINDINGS: Alignment: Chronic 2 mm spondylolisthesis at C7-T1. Skull base and vertebrae: No acute fracture. No primary bone lesion or focal pathologic process. Soft tissues and spinal canal: No prevertebral fluid or swelling. No visible canal hematoma. Disc levels: C1-2: Chronic hypertrophy of the soft tissues adjacent to the transverse ligament posterior to the odontoid process, asymmetrically prominent to the right with slight  compression of the thecal sac to the right of midline best demonstrated on image 27 of series 4. The appearance is essentially unchanged. C2-3: Disc osteophyte complex extends into the left lateral recess and left neural foramen without severe foraminal stenosis. Moderate left facet arthritis. C3-4: Disc space narrowing. Broad-based disc osteophyte complex asymmetric to the left with narrowing of both lateral recesses severe bilateral foraminal stenosis, unchanged. Narrowing of the AP dimension of the spinal canal to approximately 7 mm, unchanged. Moderate left facet arthritis. C4-5: Disc space narrowing. Broad-based disc osteophyte complex with lobulated disc extrusion extending inferiorly behind the body of C5, essentially unchanged since the prior study. The extrusion compresses the right lateral recess and could affect the right C6 nerve. Severe chronic bilateral foraminal stenosis. Moderate bilateral facet arthritis. C5-6: Minimal broad-based disc osteophyte complex without neural impingement. Widely patent neural foramina. Minimal left facet arthritis. C6-7: Previous bilateral laminotomies. Osteophytes fuse the C6-7 vertebral bodies. Calcification of posterior longitudinal ligament narrows the AP dimension of the spinal canal. Widely patent neural foramina. C7-T1: Moderate bilateral facet arthritis. 2  mm spondylolisthesis with disc space narrowing. Broad-based disc osteophyte complex without neural impingement. C7-T1: Disc space narrowing. Moderate right facet arthritis. Moderate right foraminal stenosis. Upper chest: Negative. Other: None IMPRESSION: 1. No acute abnormality of the cervical spine. No appreciable change since the prior CT angiogram of the neck dated 07/19/2017. 2. 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 3. Chronic soft disc extrusion at C4-5 asymmetric to the right as described. Electronically Signed   By: Lorriane Shire M.D.   On: 10/30/2017 08:05   Mr Cervical Spine W Or Wo Contrast  Result Date: 10/30/2017 CLINICAL DATA:  Neck pain. Infection suspected. Diabetes. Chronic kidney disease. EXAM: MRI CERVICAL SPINE WITHOUT AND WITH CONTRAST TECHNIQUE: Multiplanar and multiecho pulse sequences of the cervical spine, to include the craniocervical junction and cervicothoracic junction, were obtained without and with intravenous contrast. CONTRAST:  Gadavist, 7 mL. COMPARISON:  CT cervical spine 10/30/2017.  CT head 07/19/2017. FINDINGS: Alignment: Anatomic except for 3 mm anterolisthesis C7-T1. Vertebrae: No worrisome osseous lesion. No evidence of diskitis, or focal bone lesion. Cord: Multilevel cord compression, described below. No abnormal cord signal. Posterior Fossa, vertebral arteries, paraspinal tissues: No tonsillar herniation. Chronic LEFT cerebellar infarct. No paraspinous effusion or signs of infection. Marked pannus extends behind the odontoid greater on the RIGHT as far cephalad as the foramen magnum. There is no significant cervicomedullary compression. Disc levels: C2-3: Annular bulge. Asymmetric facet arthropathy to the LEFT. No stenosis but LEFT C3 foraminal narrowing is noted. C3-4: Disc space narrowing. Central disc extrusion, with moderate cord flattening canal diameter 5-6 mm. Facet  arthropathy. BILATERAL C4 foraminal narrowing, greater on the LEFT. C4-5: Disc space narrowing. Central disc extrusion, caudally migrated free fragments central and to the RIGHT. Severe cord compression, canal diameter 4-5 mm. BILATERAL C5 foraminal narrowing is compounded by facet arthropathy, worse on the RIGHT. C5-6: Disc desiccation. Shallow protrusion. Uncinate spurring and facet arthropathy in LEFT result in C6 foraminal narrowing. C6-7: Disc space narrowing. Central protrusion. Slight cord flattening. No definite C7 foraminal narrowing. C7-T1: 3 mm anterolisthesis. Central disc extrusion with osseous spurring. Severe facet arthropathy. Mild stenosis without cord flattening. LEFT C8 foraminal narrowing. Post infusion no concerning abnormal enhancement. IMPRESSION: Multilevel spondylosis, with areas of significant spinal stenosis at C3-4 and C4-5 due to disc herniations. Cord compression with canal diameter as narrow as 4 mm. No  signs of spinal infection, either diskitis or septic facet joint arthritis. Marked pannus, but no cervicomedullary compression. Electronically Signed   By: Staci Righter M.D.   On: 10/30/2017 17:49    Assessment & Plan:   Xaine was seen today for establish care.  Diagnoses and all orders for this visit:  Coronary artery disease due to lipid rich plaque  Essential hypertension  Chronic diastolic heart failure, NYHA class 1 (HCC)  Cerebellar stroke (HCC)  Controlled type 2 diabetes mellitus with other circulatory complication, without long-term current use of insulin (HCC)  Myasthenia gravis with exacerbation (HCC)  Septic arthritis of left sternoclavicular joint ([place])  Spinal stenosis of lumbar region, unspecified whether neurogenic claudication present  Chronic myeloid leukemia in remission [facility])  Gait disturbance, post-stroke   I have discontinued Kelli Hope. Hevia's triazolam. I am also having him maintain his loperamide, multivitamin, brimonidine-timolol,  isosorbide mononitrate, ELIQUIS, cholecalciferol, rosuvastatin, lidocaine, imatinib, pyridostigmine, predniSONE, lisinopril, cefTRIAXone, blood glucose meter kit and supplies, oxyCODONE, furosemide, and potassium chloride.  No orders of the defined types were placed in this encounter.    Follow-up: Return in about 3 months (around 03/02/2018), or if symptoms worsen or fail to improve.  Libby Maw, MD

## 2017-12-01 NOTE — Telephone Encounter (Signed)
error 

## 2017-12-02 ENCOUNTER — Other Ambulatory Visit: Payer: Self-pay | Admitting: Family Medicine

## 2017-12-02 DIAGNOSIS — G7000 Myasthenia gravis without (acute) exacerbation: Secondary | ICD-10-CM | POA: Diagnosis not present

## 2017-12-02 DIAGNOSIS — M4802 Spinal stenosis, cervical region: Secondary | ICD-10-CM | POA: Diagnosis not present

## 2017-12-02 DIAGNOSIS — M4722 Other spondylosis with radiculopathy, cervical region: Secondary | ICD-10-CM | POA: Diagnosis not present

## 2017-12-02 DIAGNOSIS — M00212 Other streptococcal arthritis, left shoulder: Secondary | ICD-10-CM | POA: Diagnosis not present

## 2017-12-02 DIAGNOSIS — B951 Streptococcus, group B, as the cause of diseases classified elsewhere: Secondary | ICD-10-CM | POA: Diagnosis not present

## 2017-12-02 DIAGNOSIS — M60012 Infective myositis, left shoulder: Secondary | ICD-10-CM | POA: Diagnosis not present

## 2017-12-02 NOTE — Telephone Encounter (Signed)
Please refer to Elliot Cousin, RN message re: request.

## 2017-12-02 NOTE — Telephone Encounter (Signed)
Requested medication (s) are due for refill today: yes  Requested medication (s) are on the active medication list: yes    Last refill: 11/12/17  #30  0 refills  Future visit scheduled yes  03/02/1008  Notes to clinic:not delegated, prescriber Dr. Renato Shin  Requested Prescriptions  Pending Prescriptions Disp Refills   oxyCODONE (OXY IR/ROXICODONE) 5 MG immediate release tablet 30 tablet 0    Sig: Take 1 tablet (5 mg total) by mouth every 6 (six) hours as needed for moderate pain or severe pain.     Not Delegated - Analgesics:  Opioid Agonists Failed - 12/02/2017  2:51 PM      Failed - This refill cannot be delegated      Passed - Urine Drug Screen completed in last 360 days.      Passed - Valid encounter within last 6 months    Recent Outpatient Visits          2 days ago Coronary artery disease due to lipid rich plaque   [facility] [place], Mortimer Fries, MD   2 months ago Cellulitis of right axilla   [place], Amy E, MD   9 months ago Right maxillary sinusitis   [place], NP   6 years ago Facial pain   [place], MD   6 years ago Acute upper respiratory infections of unspecified site   [facility] -Areatha Keas, MD      Future Appointments            In 2 weeks [place], [place], [place] [facility], [facility]   In 3 months Libby Maw, MD [facility], [facility]

## 2017-12-02 NOTE — Telephone Encounter (Signed)
please call patient: What condition does pt take this for?

## 2017-12-02 NOTE — Telephone Encounter (Signed)
Copied from [place] [id number]. Topic: Quick Communication - Rx Refill/Question >> Dec 02, 2017  2:36 PM Keene Breath wrote: Medication: oxyCODONE (OXY IR/ROXICODONE) 5 MG immediate release tablet  Patient called to request a refill for the above medication.  CB# [phone].  Preferred Pharmacy (with phone number or street name): [place], [place] [phone] (Phone) [phone] (Fax)    Agent: Please be advised that RX refills may take up to 3 business days. We ask that you follow-up with your pharmacy.

## 2017-12-03 DIAGNOSIS — M4802 Spinal stenosis, cervical region: Secondary | ICD-10-CM | POA: Diagnosis not present

## 2017-12-03 DIAGNOSIS — M4722 Other spondylosis with radiculopathy, cervical region: Secondary | ICD-10-CM | POA: Diagnosis not present

## 2017-12-03 DIAGNOSIS — M60012 Infective myositis, left shoulder: Secondary | ICD-10-CM | POA: Diagnosis not present

## 2017-12-03 DIAGNOSIS — B951 Streptococcus, group B, as the cause of diseases classified elsewhere: Secondary | ICD-10-CM | POA: Diagnosis not present

## 2017-12-03 DIAGNOSIS — G7000 Myasthenia gravis without (acute) exacerbation: Secondary | ICD-10-CM | POA: Diagnosis not present

## 2017-12-03 DIAGNOSIS — M00212 Other streptococcal arthritis, left shoulder: Secondary | ICD-10-CM | POA: Diagnosis not present

## 2017-12-03 MED ORDER — OXYCODONE HCL 5 MG PO TABS: 5.0000 mg | ORAL_TABLET | Freq: Four times a day (QID) | ORAL | 0 refills | Status: DC | PRN

## 2017-12-03 NOTE — Telephone Encounter (Signed)
please call patient: I refilled.  If you continue to have pain needing this medication, please discuss with Dr Ethelene Hal.

## 2017-12-03 NOTE — Telephone Encounter (Signed)
Pt admitted 10/30/17 for Septic arthritis of the left sternoclavicular joint. Undergoing PT/OT services with approx 2 weeks remaining. Last OV with Dr. Ethelene Hal was 11/30/17.

## 2017-12-06 ENCOUNTER — Ambulatory Visit: Payer: Medicare Other | Admitting: Cardiovascular Disease

## 2017-12-07 ENCOUNTER — Encounter: Payer: Self-pay | Admitting: Infectious Diseases

## 2017-12-07 DIAGNOSIS — B951 Streptococcus, group B, as the cause of diseases classified elsewhere: Secondary | ICD-10-CM | POA: Diagnosis not present

## 2017-12-07 DIAGNOSIS — M4722 Other spondylosis with radiculopathy, cervical region: Secondary | ICD-10-CM | POA: Diagnosis not present

## 2017-12-07 DIAGNOSIS — M60012 Infective myositis, left shoulder: Secondary | ICD-10-CM | POA: Diagnosis not present

## 2017-12-07 DIAGNOSIS — M00212 Other streptococcal arthritis, left shoulder: Secondary | ICD-10-CM | POA: Diagnosis not present

## 2017-12-07 DIAGNOSIS — G7000 Myasthenia gravis without (acute) exacerbation: Secondary | ICD-10-CM | POA: Diagnosis not present

## 2017-12-07 DIAGNOSIS — M4802 Spinal stenosis, cervical region: Secondary | ICD-10-CM | POA: Diagnosis not present

## 2017-12-08 DIAGNOSIS — M4722 Other spondylosis with radiculopathy, cervical region: Secondary | ICD-10-CM | POA: Diagnosis not present

## 2017-12-08 DIAGNOSIS — M60012 Infective myositis, left shoulder: Secondary | ICD-10-CM | POA: Diagnosis not present

## 2017-12-08 DIAGNOSIS — B951 Streptococcus, group B, as the cause of diseases classified elsewhere: Secondary | ICD-10-CM | POA: Diagnosis not present

## 2017-12-08 DIAGNOSIS — M4802 Spinal stenosis, cervical region: Secondary | ICD-10-CM | POA: Diagnosis not present

## 2017-12-08 DIAGNOSIS — G7000 Myasthenia gravis without (acute) exacerbation: Secondary | ICD-10-CM | POA: Diagnosis not present

## 2017-12-08 DIAGNOSIS — M00212 Other streptococcal arthritis, left shoulder: Secondary | ICD-10-CM | POA: Diagnosis not present

## 2017-12-10 ENCOUNTER — Ambulatory Visit: Payer: Medicare Other | Admitting: Cardiovascular Disease

## 2017-12-10 DIAGNOSIS — G7000 Myasthenia gravis without (acute) exacerbation: Secondary | ICD-10-CM | POA: Diagnosis not present

## 2017-12-10 DIAGNOSIS — M00212 Other streptococcal arthritis, left shoulder: Secondary | ICD-10-CM | POA: Diagnosis not present

## 2017-12-10 DIAGNOSIS — M4802 Spinal stenosis, cervical region: Secondary | ICD-10-CM | POA: Diagnosis not present

## 2017-12-10 DIAGNOSIS — B951 Streptococcus, group B, as the cause of diseases classified elsewhere: Secondary | ICD-10-CM | POA: Diagnosis not present

## 2017-12-10 DIAGNOSIS — M4722 Other spondylosis with radiculopathy, cervical region: Secondary | ICD-10-CM | POA: Diagnosis not present

## 2017-12-10 DIAGNOSIS — M60012 Infective myositis, left shoulder: Secondary | ICD-10-CM | POA: Diagnosis not present

## 2017-12-13 ENCOUNTER — Ambulatory Visit: Payer: Medicare Other | Admitting: Physician Assistant

## 2017-12-13 DIAGNOSIS — M545 Low back pain: Secondary | ICD-10-CM | POA: Diagnosis not present

## 2017-12-13 DIAGNOSIS — M4802 Spinal stenosis, cervical region: Secondary | ICD-10-CM | POA: Diagnosis not present

## 2017-12-14 DIAGNOSIS — M00212 Other streptococcal arthritis, left shoulder: Secondary | ICD-10-CM | POA: Diagnosis not present

## 2017-12-14 DIAGNOSIS — M60012 Infective myositis, left shoulder: Secondary | ICD-10-CM | POA: Diagnosis not present

## 2017-12-14 DIAGNOSIS — M4802 Spinal stenosis, cervical region: Secondary | ICD-10-CM | POA: Diagnosis not present

## 2017-12-14 DIAGNOSIS — M4722 Other spondylosis with radiculopathy, cervical region: Secondary | ICD-10-CM | POA: Diagnosis not present

## 2017-12-14 DIAGNOSIS — G7000 Myasthenia gravis without (acute) exacerbation: Secondary | ICD-10-CM | POA: Diagnosis not present

## 2017-12-14 DIAGNOSIS — B951 Streptococcus, group B, as the cause of diseases classified elsewhere: Secondary | ICD-10-CM | POA: Diagnosis not present

## 2017-12-15 ENCOUNTER — Encounter: Payer: Self-pay | Admitting: Physician Assistant

## 2017-12-15 ENCOUNTER — Ambulatory Visit (INDEPENDENT_AMBULATORY_CARE_PROVIDER_SITE_OTHER): Payer: Medicare Other | Admitting: Physician Assistant

## 2017-12-15 VITALS — BP 118/74 | HR 58 | Ht 67.0 in | Wt 176.0 lb

## 2017-12-15 DIAGNOSIS — Z86718 Personal history of other venous thrombosis and embolism: Secondary | ICD-10-CM

## 2017-12-15 DIAGNOSIS — E441 Mild protein-calorie malnutrition: Secondary | ICD-10-CM

## 2017-12-15 DIAGNOSIS — I5032 Chronic diastolic (congestive) heart failure: Secondary | ICD-10-CM

## 2017-12-15 DIAGNOSIS — I639 Cerebral infarction, unspecified: Secondary | ICD-10-CM

## 2017-12-15 DIAGNOSIS — I2510 Atherosclerotic heart disease of native coronary artery without angina pectoris: Secondary | ICD-10-CM

## 2017-12-15 DIAGNOSIS — M7989 Other specified soft tissue disorders: Secondary | ICD-10-CM | POA: Diagnosis not present

## 2017-12-15 NOTE — Progress Notes (Signed)
Cardiology Office Note:    Date:  12/15/2017   ID:  MORGAN RENNERT, DOB 1940-10-03, MRN [id number]  PCP:  Libby Maw, MD  Cardiologist:  Sherren Mocha, MD   Electrophysiologist:  None   Referring MD: Renato Shin, MD   Chief Complaint  Patient presents with  . Follow-up    leg swelling     History of Present Illness:    MAEL DELAP is a 77 y.o. 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  He was last seen by Dr. Burt Knack 11/26/2017.  He had significant leg swelling that was felt to likely be multifactorial.  The patient does have chronic diastolic heart failure and his Lasix dose was adjusted.  Of note, the patient is on long term steroid therapy.     Mr. Bohlken returns for follow-up on lower extremity swelling and diastolic heart failure.  He is here alone.  He has had only marginal improvement in his swelling since last seen.  He has not had any chest discomfort.  He has not noted any significant shortness of breath.  He denies orthopnea or paroxysmal nocturnal dyspnea.  He denies syncope.  He denies any bleeding issues.     Prior CV studies:   The following studies were reviewed today:  Echo 07/20/2017 Mild concentric LVH, EF 50-55, normal wall motion, grade 1 diastolic dysfunction, mild aortic stenosis (mean 7 mmHg), trivial AI, mild MR, normal RVSF  Carotid US 02/26/2017 Final Interpretation: Right Carotid: Velocities in the right ICA are consistent with a 40-59% stenosis. Left  Carotid: Velocities in the left ICA are consistent with a 1-39% stenosis. Vertebrals: Right vertebral artery was patent with antegrade flow. No evidence       of flow detected in the left vertebral artery. Subclavians: Normal flow hemodynamics were seen in bilateral subclavian       arteries.  Nuclear stress test 09/27/2015 Low risk stress nuclear study with normal perfusion and normal left ventricular regional and global systolic function.  Cardiac catheterization 03/17/2001 ANGIOGRAPHIC FINDINGS: 1. The left main coronary artery has approximately 10-20% stenosis, but    no significant flow-limiting lesions. 2. The left anterior descending is a medium caliber vessel that provides    three diagonal branches. There is scattered 20% stenosis in the proximal    to mid vessel with 30% more distal stenosis followed by an area of    50-60% stenosis towards the apex. Flow is TIMI-3 throughout this vessel. 3. The circumflex coronary artery essentially provides one large obtuse    marginal branch then has a 20% stenosis. 4. The right coronary artery is a dominant vessel that essentially has two    branches in the posterior descending distribution. There was a 30%    distal RCA stenosis and a focal 95% stenosis in the smaller of the two    branches in the posterior descending territory.  LEFT VENTRICULOGRAM: The left ventriculography reveals an ejection fraction estimated at 60-65% with no focal wall motion abnormalities and no mitral regurgitation.  DIAGNOSES: 1. Distal and branch vessel coronary artery disease as outlined. 2. Normal left  ventricular contraction estimated at 60-65% with no    significant mitral regurgitation.   Past Medical History:  Diagnosis Date  . Bruises easily    d/t being on Eliquis  . Carotid artery disease ([place])    a. Duplex 05/2014: 72-62% RICA, 0-35% LICA, elevated velocities in right subclavian artery, normal left subclavian artery..  . Chronic  back pain    HNP  . Chronic kidney disease (CKD)   . Claustrophobia    takes Ativan as needed  . 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 Coronary atherosclerosis of unspecified type of vessel, native or graft    a. cath in 2003 showed 10-20% LM, scattered 20% prox-mid LAD, 30% more distal LAD, 50-60% stenosis of LAD towards apex, 20% Cx, 30% dRCA, focal 95% stenosis in smaller of 2 branches of PDA, EF 60-65%.  . Diabetes mellitus ([place])   . Diarrhea    takes Imodium daily as needed  . Diverticulosis   . Diverticulosis of colon (without mention of hemorrhage)   . DJD (degenerative joint disease)   . DVT (deep venous thrombosis) ([place]) 07/2011  . Esophageal stricture   . Essential hypertension    takes Imdur and Lisinopril daily  . Gastric polyps    benign  . GERD (gastroesophageal reflux disease)    takes Nexium daily  . Glaucoma    uses eye drops  . History of bronchitis    not sure when the last time  . History of colon polyps    benign  . History of kidney stones   . History of kidney stones   . Insomnia    takes Melatonin nightly as needed  . Mixed hyperlipidemia    takes Crestor daily  . Myasthenia gravis ([place])    uses prednisone  . Osteoporosis 2016  . Peripheral edema    takes Lasix daily  . Pituitary tumor    [facility]den [facility] checks it every Jan   . Pneumonia 01/2016  . PONV (postoperative nausea and vomiting)   . Ptosis   . Pulmonary embolus ([place]) 2012  . Stroke [facility]) 06/2015   Surgical Hx: The patient  has a past surgical history that includes Back surgery; Tonsillectomy; Cataract extraction (Bilateral, 12/12); Shoulder surgery (Left, 05/07/2009); Cardiac catheterization; Lumbar laminectomy/decompression microdiscectomy (Right, 09/14/2012); Esophagogastroduodenoscopy endoscopy (04/2015); ir generic historical (03/03/2016); Colonoscopy; Lithotripsy; Lumbar laminectomy/decompression microdiscectomy (Left, 03/11/2016); ir generic  historical (04/09/2016); ir generic historical (04/30/2016); ABDOMINAL AORTOGRAM W/LOWER EXTREMITY (N/A, 11/18/2016); and PERIPHERAL VASCULAR INTERVENTION (11/18/2016).   Current Medications: Current Meds  Medication Sig  . blood glucose meter kit and supplies KIT Dispense based on patient and insurance preference. Use up to four times daily as directed. (FOR ICD-9 250.00, 250.01).  . brimonidine-timolol (COMBIGAN) 0.2-0.5 % ophthalmic solution Place 1 drop into both eyes every 12 (twelve) hours.  Marland Kitchen ELIQUIS 5 MG TABS tablet TAKE 1 TABLET (5 MG TOTAL) BY MOUTH 2 (TWO) TIMES DAILY.  . furosemide (LASIX) 40 MG tablet Take 1 tablet (40 mg total) by mouth daily.  Marland Kitchen imatinib (GLEEVEC) 100 MG tablet Take 300 mg by mouth daily at 12 noon. Take with meals and large glass of water.Caution:Chemotherapy   . isosorbide mononitrate (IMDUR) 30 MG 24 hr tablet Take 1 tablet (30 mg total) by mouth daily.  Marland Kitchen lidocaine (LIDODERM) 5 % Place 1 patch onto the skin daily as needed (for back pain). Remove & Discard patch within 12 hours or as directed by MD   .  lisinopril (PRINIVIL,ZESTRIL) 10 MG tablet Take 1 tablet (10 mg total) by mouth daily.  Marland Kitchen loperamide (IMODIUM) 2 MG capsule Take 2-4 mg by mouth 3 (three) times daily as needed for diarrhea or loose stools.   . Multiple Vitamin (MULTIVITAMIN) tablet Take 1 tablet by mouth daily.  Marland Kitchen oxyCODONE (OXY IR/ROXICODONE) 5 MG immediate release tablet Take 1 tablet (5 mg total) by mouth every 6 (six) hours as needed for moderate pain or severe pain.  . potassium chloride (K-DUR) 10 MEQ tablet Take 1 tablet (10 mEq total) by mouth daily.  . predniSONE (DELTASONE) 1 MG tablet Take 3 tablets (3 mg total) by mouth daily with breakfast.  . pyridostigmine (MESTINON) 60 MG tablet Take 60 mg by mouth daily.  . rosuvastatin (CRESTOR) 10 MG tablet Take 5 mg by mouth daily.     Allergies:   Phenergan [promethazine hcl]; Zofran [ondansetron hcl]; and Sulfamethoxazole   Social History     Tobacco Use  . Smoking status: Never Smoker  . Smokeless tobacco: Never Used  Substance Use Topics  . Alcohol use: No    Alcohol/week: 0.0 standard drinks  . Drug use: No     Family Hx: The patient's family history includes Breast cancer in his sister; Clotting disorder in his sister; Dementia in his sister; Heart attack in his father, mother, sister, sister, and sister; Heart disease in his father, mother, and sister; Stroke in his father and mother. There is no history of Colon cancer.  ROS:   Please see the history of present illness.    Review of Systems  HENT: Positive for hearing loss.   Cardiovascular: Positive for leg swelling.  Musculoskeletal: Positive for back pain, joint pain, joint swelling and myalgias.  Neurological: Positive for loss of balance.   All other systems reviewed and are negative.   EKGs/Labs/Other Test Reviewed:    EKG:  EKG is n ordered today.     Recent Labs: 09/27/2017: TSH 0.51 10/06/2017: Pro B Natriuretic peptide (BNP) 73.0 10/31/2017: ALT 37 11/06/2017: BUN 21; Creatinine, Ser 1.27; Hemoglobin 11.9; Magnesium 2.0; Platelets 177; Potassium 4.0; Sodium 135   Recent Lipid Panel Lab Results  Component Value Date/Time   CHOL 134 07/20/2017 05:25 AM   TRIG 166 (H) 07/20/2017 05:25 AM   HDL 33 (L) 07/20/2017 05:25 AM   CHOLHDL 4.1 07/20/2017 05:25 AM   LDLCALC 68 07/20/2017 05:25 AM   LDLDIRECT 59.8 10/12/2012 07:37 AM    Physical Exam:    VS:  BP 118/74   Pulse (!) 58   Ht 5' 7"  (1.702 m)   Wt 176 lb (79.8 kg)   BMI 27.57 kg/m     Wt Readings from Last 3 Encounters:  12/15/17 176 lb (79.8 kg)  11/30/17 171 lb 4 oz (77.7 kg)  11/26/17 172 lb (78 kg)     Physical Exam  Constitutional: He is oriented to person, place, and time. He appears well-developed and well-nourished. No distress.  HENT:  Head: Normocephalic and atraumatic.  Neck: No thyromegaly present.  Cardiovascular: Normal rate and regular rhythm.  No murmur  heard. Pulmonary/Chest: Effort normal. He has no rales.  Abdominal: Soft.  Musculoskeletal: He exhibits edema (1-2+ bilat LE edema).  Lymphadenopathy:    He has no cervical adenopathy.  Neurological: He is alert and oriented to person, place, and time.  Skin: Skin is warm and dry.  Psychiatric: He has a normal mood and affect.    ASSESSMENT & PLAN:    Leg swelling  His leg swelling seems to be multifactorial.  He has not been as active in the last couple of months due to his back issues.  His surgeon recently told him that he is not a candidate for surgery.  He also is noted to have hypoalbuminemia on lab work done in September 2019.  He also has evidence of venous insufficiency on exam.  We discussed continued elevation as well as compression to help with his swelling.  We also discussed increasing protein in his diet to help with his hypoalbuminemia.  At this point, I would not increase his Lasix unless his renal function remains stable.  -Obtain CMET, CBC today  -If creatinine stable, consider increasing Lasix to 60 mg a day for 5 days, then resume 40 mg daily  -Keep legs elevated  -Use compression stockings  Chronic diastolic heart failure, NYHA class 1 ([place]) As noted, he has had mild diastolic dysfunction on echocardiogram in the past.  His lungs are clear.  He is not really that short of breath.  Continue current dose of Lasix and obtain labs today as noted.  Adjust Lasix as needed.  Coronary artery disease involving native coronary artery of native heart without angina pectoris History of distal vessel CAD by remote cardiac catheterization.  Nuclear stress test in 2017 was low risk.  He has been managed medically.  He denies angina.  He is not on aspirin as he is on Apixaban.  Continue rosuvastatin, nitrates.  Mild protein-calorie malnutrition ([place]) As noted, he recently had an albumin of 2.6.  Obtain a follow-up CMET today.  As noted, I have recommended to increase protein in his  diet.    History of DVT (deep vein thrombosis) He is on chronic anticoagulation with Apixaban.   Dispo:  Return in about 8 weeks (around 02/09/2018) for Routine Follow Up, w/ Dr. Burt Knack, or Richardson Dopp, PA-C.   Medication Adjustments/Labs and Tests Ordered: Current medicines are reviewed at length with the patient today.  Concerns regarding medicines are outlined above.  Tests Ordered: Orders Placed This Encounter  Procedures  . Comprehensive metabolic panel  . CBC   Medication Changes: No orders of the defined types were placed in this encounter.   Signed, Richardson Dopp, PA-C  12/15/2017 2:57 PM    [facility] [address], [place], [place]  [address] Phone: [phone]; Fax: [phone]

## 2017-12-15 NOTE — Patient Instructions (Addendum)
Medication Instructions:  Your physician recommends that you continue on your current medications as directed. Please refer to the Current Medication list given to you today.  If you need a refill on your cardiac medications before your next appointment, please call your pharmacy.   Lab work: TODAY: CBC, CMET If you have labs (blood work) drawn today and your tests are completely normal, you will receive your results only by: Marland Kitchen MyChart Message (if you have MyChart) OR . A paper copy in the mail If you have any lab test that is abnormal or we need to change your treatment, we will call you to review the results.  Testing/Procedures: NONE  Follow-Up: At [facility], you and your health needs are our priority.  As part of our continuing mission to provide you with exceptional heart care, we have created designated Provider Care Teams.  These Care Teams include your primary Cardiologist (physician) and Advanced Practice Providers (APPs -  Physician Assistants and Nurse Practitioners) who all work together to provide you with the care you need, when you need it. You will need a follow up appointment with Richardson Dopp, PA-C on January 8th @ 10:45 am.    Any Other Special Instructions Will Be Listed Below (If Applicable).  1. Make sure to wear compression stockings  2. Increase protein in your diet  3. Keep legs elevated when sitting.

## 2017-12-16 ENCOUNTER — Ambulatory Visit (INDEPENDENT_AMBULATORY_CARE_PROVIDER_SITE_OTHER): Payer: Medicare Other | Admitting: Family

## 2017-12-16 ENCOUNTER — Telehealth: Payer: Self-pay

## 2017-12-16 ENCOUNTER — Encounter: Payer: Self-pay | Admitting: Family

## 2017-12-16 VITALS — BP 146/80 | HR 109 | Temp 98.1°F | Wt 171.0 lb

## 2017-12-16 DIAGNOSIS — M009 Pyogenic arthritis, unspecified: Secondary | ICD-10-CM

## 2017-12-16 DIAGNOSIS — I2510 Atherosclerotic heart disease of native coronary artery without angina pectoris: Secondary | ICD-10-CM

## 2017-12-16 DIAGNOSIS — M7989 Other specified soft tissue disorders: Secondary | ICD-10-CM

## 2017-12-16 DIAGNOSIS — I639 Cerebral infarction, unspecified: Secondary | ICD-10-CM

## 2017-12-16 DIAGNOSIS — I5032 Chronic diastolic (congestive) heart failure: Secondary | ICD-10-CM

## 2017-12-16 DIAGNOSIS — I2583 Coronary atherosclerosis due to lipid rich plaque: Secondary | ICD-10-CM

## 2017-12-16 LAB — COMPREHENSIVE METABOLIC PANEL
A/G RATIO: 2.6 — AB (ref 1.2–2.2)
ALK PHOS: 55 IU/L (ref 39–117)
ALT: 22 IU/L (ref 0–44)
AST: 30 IU/L (ref 0–40)
Albumin: 4.1 g/dL (ref 3.5–4.8)
BUN/Creatinine Ratio: 10 (ref 10–24)
BUN: 14 mg/dL (ref 8–27)
Bilirubin Total: 0.4 mg/dL (ref 0.0–1.2)
CALCIUM: 9.7 mg/dL (ref 8.6–10.2)
CO2: 24 mmol/L (ref 20–29)
CREATININE: 1.44 mg/dL — AB (ref 0.76–1.27)
Chloride: 103 mmol/L (ref 96–106)
GFR calc Af Amer: 53 mL/min/{1.73_m2} — ABNORMAL LOW (ref >59)
GFR, EST NON AFRICAN AMERICAN: 46 mL/min/{1.73_m2} — AB (ref >59)
GLOBULIN, TOTAL: 1.6 g/dL (ref 1.5–4.5)
Glucose: 109 mg/dL — ABNORMAL HIGH (ref 65–99)
POTASSIUM: 4.6 mmol/L (ref 3.5–5.2)
SODIUM: 145 mmol/L — AB (ref 134–144)
Total Protein: 5.7 g/dL — ABNORMAL LOW (ref 6.0–8.5)

## 2017-12-16 LAB — CBC
HEMOGLOBIN: 10.1 g/dL — AB (ref 13.0–17.7)
Hematocrit: 30.8 % — ABNORMAL LOW (ref 37.5–51.0)
MCH: 32.8 pg (ref 26.6–33.0)
MCHC: 32.8 g/dL (ref 31.5–35.7)
MCV: 100 fL — ABNORMAL HIGH (ref 79–97)
Platelets: 216 10*3/uL (ref 150–450)
RBC: 3.08 x10E6/uL — AB (ref 4.14–5.80)
RDW: 14.3 % (ref 12.3–15.4)
WBC: 8.7 10*3/uL (ref 3.4–10.8)

## 2017-12-16 NOTE — Patient Instructions (Signed)
Nice to see you.  We will check your blood work today.   Plan for follow up office visit as needed pending blood work results.

## 2017-12-16 NOTE — Progress Notes (Signed)
Per verbal order from Terri Piedra, FNP, 40 cm Single Lumen Peripherally Inserted Central Catheter removed from right basilic, tip intact. No sutures present. RN confirmed length per chart. Dressing was clean and dry. Pressure held for 5+ minutes and petroleum dressing applied. Pt advised no heavy lifting with this arm, leave dressing for 24 hours and call the office or seek emergent care if dressing becomes soaked with blood or swelling or sharp pain presents. Patient verbalized understanding and agreement.  Patient and wife's questions answered to their satisfaction. Patient tolerated procedure well, patient walked to check out desk.  Cassie ([place]) and Corporate treasurer ([place]) notified. Landis Gandy, RN

## 2017-12-16 NOTE — Assessment & Plan Note (Signed)
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 He is doing very well today with only occasional pain. Seen by neurosurgery with no surgical intervention at present. Check CRP and ESR today. Will have PICC removed today. If additional therapy is indicated will treat with oral medication. Follow up pending blood work results if needed.

## 2017-12-16 NOTE — Progress Notes (Signed)
Subjective:    Patient ID: [name], male    DOB: 1940-08-28, 77 y.o.   MRN: [id number]  Chief Complaint  Patient presents with  . Septic Arthritis    HPI:  [name] is a 77 y.o. male who presents today for initial office visit following hospitalization   [name] was recently admitted to the hospital with a chief complaint of neck pain starting approximately 24 hours prior to presentation.  He was febrile with a temperature of 100.1 and mildly tachycardic in the emergency department.  CT scan of the cervical spine with no acute abnormality diffuse degenerative disc disease with severe stenosis at C3-4 and C4-5.  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  Neurosurgery was consulted with recommendation for ACDF of C3-C4 and corpectomy C5 once medically optimized.  Interventional radiology performed aspiration with cultures being obtained and growing group B streptococcus.  He was discharged on ceftriaxone with end date of 12/14/2017. All hospital records, labs and imaging were reviewed in detail.   [name] completed his antimicrobial therapy on 12/14/17 and conitnues to have his PICC line in place. He had no adverse side effects or missed doses while taking the ceftriaxone. He is feeling much better and range of motion and function of his shoulder has returned to near baseline with only occasional pain. No fevers, chills or sweats. Previous inflammatory marker lab work is currently unavailable for review.    Allergies  Allergen Reactions  . Phenergan [Promethazine Hcl] Swelling and Other (See Comments)    Cannot be combined with Zofran because swelling of the eyes resulted and patient became very restless-  . Zofran [Ondansetron Hcl] Swelling and Other (See Comments)    Cannot be combined with Phenergan because swelling of the eyes resulted and patient became very restless-  .  Sulfamethoxazole Rash and Itching      Outpatient Medications Prior to Visit  Medication Sig Dispense Refill  . blood glucose meter kit and supplies KIT Dispense based on patient and insurance preference. Use up to four times daily as directed. (FOR ICD-9 250.00, 250.01). 1 each 0  . brimonidine-timolol (COMBIGAN) 0.2-0.5 % ophthalmic solution Place 1 drop into both eyes every 12 (twelve) hours.    . cholecalciferol (VITAMIN D) 1000 units tablet Take 1,000 Units by mouth daily.     Marland Kitchen ELIQUIS 5 MG TABS tablet TAKE 1 TABLET (5 MG TOTAL) BY MOUTH 2 (TWO) TIMES DAILY. 60 tablet 3  . furosemide (LASIX) 40 MG tablet Take 1 tablet (40 mg total) by mouth daily. 90 tablet 3  . imatinib (GLEEVEC) 100 MG tablet Take 300 mg by mouth daily at 12 noon. Take with meals and large glass of water.Caution:Chemotherapy     . isosorbide mononitrate (IMDUR) 30 MG 24 hr tablet Take 1 tablet (30 mg total) by mouth daily. 90 tablet 3  . lidocaine (LIDODERM) 5 % Place 1 patch onto the skin daily as needed (for back pain). Remove & Discard patch within 12 hours or as directed by MD     . lisinopril (PRINIVIL,ZESTRIL) 10 MG tablet Take 1 tablet (10 mg total) by mouth daily. 30 tablet 11  . loperamide (IMODIUM) 2 MG capsule Take 2-4 mg by mouth 3 (three) times daily as needed for diarrhea or loose stools.     . Multiple Vitamin (MULTIVITAMIN) tablet Take 1 tablet by mouth daily.    Marland Kitchen  oxyCODONE (OXY IR/ROXICODONE) 5 MG immediate release tablet Take 1 tablet (5 mg total) by mouth every 6 (six) hours as needed for moderate pain or severe pain. 20 tablet 0  . potassium chloride (K-DUR) 10 MEQ tablet Take 1 tablet (10 mEq total) by mouth daily. 90 tablet 3  . predniSONE (DELTASONE) 1 MG tablet Take 3 tablets (3 mg total) by mouth daily with breakfast. 270 tablet 1  . pyridostigmine (MESTINON) 60 MG tablet Take 60 mg by mouth daily.    . rosuvastatin (CRESTOR) 10 MG tablet Take 5 mg by mouth daily.     No facility-administered  medications prior to visit.      Past Medical History:  Diagnosis Date  . Bruises easily    d/t being on Eliquis  . Carotid artery disease ([place])    a. Duplex 05/2014: 98-92% RICA, 1-19% LICA, elevated velocities in right subclavian artery, normal left subclavian artery..  . Chronic back pain    HNP  . Chronic kidney disease (CKD)   . Claustrophobia    takes Ativan as needed  . 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 Coronary atherosclerosis of unspecified type of vessel, native or graft    a. cath in 2003 showed 10-20% LM, scattered 20% prox-mid LAD, 30% more distal LAD, 50-60% stenosis of LAD towards apex, 20% Cx, 30% dRCA, focal 95% stenosis in smaller of 2 branches of PDA, EF 60-65%.  . Diabetes mellitus ([place])   . Diarrhea    takes Imodium daily as needed  . Diverticulosis   . Diverticulosis of colon (without mention of hemorrhage)   . DJD (degenerative joint disease)   . DVT (deep venous thrombosis) ([place]) 07/2011  . Esophageal stricture   . Essential hypertension    takes Imdur and Lisinopril daily  . Gastric polyps    benign  . GERD (gastroesophageal reflux disease)    takes Nexium daily  . Glaucoma    uses eye drops  . History of bronchitis    not sure when the last time  . History of colon polyps    benign  . History of kidney stones   . History of kidney stones   . Insomnia    takes Melatonin nightly as needed  . Mixed hyperlipidemia    takes Crestor daily  . Myasthenia gravis ([place])    uses prednisone  . Osteoporosis 2016  . Peripheral edema    takes Lasix daily  . Pituitary tumor    checks Dr.Walden at [name] checks it every Jan   . Pneumonia 01/2016  . PONV (postoperative nausea and vomiting)   . Ptosis   . Pulmonary embolus ([place]) 2012  . Stroke [facility]) 06/2015      Past Surgical History:  Procedure Laterality Date  . ABDOMINAL AORTOGRAM W/LOWER EXTREMITY N/A 11/18/2016   Procedure: ABDOMINAL AORTOGRAM W/LOWER EXTREMITY;   Surgeon: [name], MD;  Location: [facility];  Service: Cardiovascular;  Laterality: N/A;  . BACK SURGERY     x2, lumbar and cervical  . CARDIAC CATHETERIZATION    . CATARACT EXTRACTION Bilateral 12/12  . COLONOSCOPY    . ESOPHAGOGASTRODUODENOSCOPY ENDOSCOPY  04/2015  . IR GENERIC HISTORICAL  03/03/2016   IR IVC FILTER PLMT / S&I [name] GUID/MOD SED 03/03/2016 [name], MD [facility]  . IR GENERIC HISTORICAL  04/09/2016   IR RADIOLOGIST EVAL & MGMT 04/09/2016 [name], MD [facility]  . IR GENERIC HISTORICAL  04/30/2016   IR IVC FILTER RETRIEVAL / S&I [name] GUID/MOD SED 04/30/2016 [name], MD [facility]  . LITHOTRIPSY    . LUMBAR LAMINECTOMY/DECOMPRESSION MICRODISCECTOMY Right 09/14/2012   Procedure: Right Lumbar three-four, four-five, Lumbar five-Sacral one decompressive laminectomy;  Surgeon: [name], MD;  Location: [facility];  Service: Neurosurgery;  Laterality: Right;  . LUMBAR LAMINECTOMY/DECOMPRESSION MICRODISCECTOMY Left 03/11/2016   Procedure: Left Lumbar Two-ThreeMicrodiscectomy;  Surgeon: [name], MD;  Location: [facility];  Service: Neurosurgery;  Laterality: Left;  . PERIPHERAL VASCULAR INTERVENTION  11/18/2016   Procedure: PERIPHERAL VASCULAR INTERVENTION;  Surgeon: [name], MD;  Location: [facility];  Service: Cardiovascular;;  Left popliteal  . SHOULDER SURGERY Left 05/07/2009  . TONSILLECTOMY        Family History  Problem Relation Age of Onset  . Heart disease Mother   . Heart attack Mother   . Stroke Mother   . Heart disease Father   . Heart attack Father   . Stroke Father   . Breast cancer Sister        Twin   . Heart attack Sister   . Dementia Sister   . Heart disease Sister   . Heart attack Sister   . Clotting disorder Sister   . Heart attack Sister   . Colon cancer Neg Hx       Social History   Socioeconomic History  . Marital status: Married    Spouse name: Hassan Rowan  . Number of  children: 2  . Years of education: 1-College  . Highest education level: Not on file  Occupational History  . 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 Financial resource strain: Not on file  . Food insecurity:    Worry: Not on file    Inability: Not on file  . Transportation needs:    Medical: Not on file    Non-medical: Not on file  Tobacco Use  . Smoking status: Never Smoker  . Smokeless tobacco: Never Used  Substance and Sexual Activity  . Alcohol use: No    Alcohol/week: 0.0 standard drinks  . Drug use: No  . Sexual activity: Not Currently  Lifestyle  . Physical activity:    Days per week: Not on file    Minutes per session: Not on file  . Stress: Not on file  Relationships  . Social connections:    Talks on phone: Patient refused    Gets together: Patient refused    Attends religious service: Patient refused    Active member of club or organization: Patient refused    Attends meetings of clubs or organizations: Patient refused    Relationship status: Patient refused  . Intimate partner violence:    Fear of current or ex partner: Patient refused    Emotionally abused: Patient refused    Physically abused: Patient refused    Forced sexual activity: Patient refused  Other Topics Concern  . Not on file  Social History Narrative   Lives at home w/ his wife   Patient is right handed.   Patient has 1-2 cups of caffeine daily.     Review of Systems  Constitutional: Negative for chills and fever.  Respiratory: Negative for chest tightness and shortness of breath.   Cardiovascular: Negative for chest pain.  Gastrointestinal: Negative for abdominal pain, constipation, diarrhea, nausea and vomiting.  Musculoskeletal: Negative for arthralgias and joint swelling.  Objective:    BP (!) 146/80   Pulse (!) 109   Temp 98.1 F (36.7 C) (Oral)   Wt 171 lb (77.6 kg)   BMI 26.78 kg/m  Nursing note and vital signs  reviewed.  Physical Exam  Constitutional: He is oriented to person, place, and time. He appears well-developed and well-nourished. No distress.  Cardiovascular: Normal rate, regular rhythm, normal heart sounds and intact distal pulses.  Pulmonary/Chest: Effort normal and breath sounds normal.  Musculoskeletal:  Left sternoclavicular joint with no obvious deformity, discoloration or edema. There is no tenderness or crepitus. Range of motion significantly improved.   Neurological: He is alert and oriented to person, place, and time.  Skin: Skin is warm and dry.  Psychiatric: He has a normal mood and affect. His behavior is normal. Judgment and thought content normal.        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 He is doing very well today with only occasional pain. Seen by neurosurgery with no surgical intervention at present. Check CRP and ESR today. Will have PICC removed today. If additional therapy is indicated will treat with oral medication. Follow up pending blood work results if needed.       Relevant Orders   C-reactive protein   Sedimentation rate       I am having Kelli Hope. Vanaken maintain his loperamide, multivitamin, brimonidine-timolol, isosorbide mononitrate, ELIQUIS, cholecalciferol, rosuvastatin, lidocaine, imatinib, pyridostigmine, predniSONE, lisinopril, blood glucose meter kit and supplies, furosemide, potassium chloride, and oxyCODONE.   Follow-up: Return if symptoms worsen or fail to improve.    Terri Piedra, MSN, FNP-C Nurse Practitioner [facility] Office phone: [phone] Pager: [place]  number: [phone]

## 2017-12-16 NOTE — Telephone Encounter (Signed)
Reviewed labs with pt who verbalized understanding. Per Nicki Reaper recommendations for a repeat BMET in a week have been put into Epic.

## 2017-12-17 DIAGNOSIS — M4722 Other spondylosis with radiculopathy, cervical region: Secondary | ICD-10-CM | POA: Diagnosis not present

## 2017-12-17 DIAGNOSIS — M00212 Other streptococcal arthritis, left shoulder: Secondary | ICD-10-CM | POA: Diagnosis not present

## 2017-12-17 DIAGNOSIS — M4802 Spinal stenosis, cervical region: Secondary | ICD-10-CM | POA: Diagnosis not present

## 2017-12-17 DIAGNOSIS — B951 Streptococcus, group B, as the cause of diseases classified elsewhere: Secondary | ICD-10-CM | POA: Diagnosis not present

## 2017-12-17 DIAGNOSIS — M60012 Infective myositis, left shoulder: Secondary | ICD-10-CM | POA: Diagnosis not present

## 2017-12-17 DIAGNOSIS — G7000 Myasthenia gravis without (acute) exacerbation: Secondary | ICD-10-CM | POA: Diagnosis not present

## 2017-12-17 LAB — C-REACTIVE PROTEIN: CRP: 2.5 mg/L (ref <8.0)

## 2017-12-17 LAB — SEDIMENTATION RATE: SED RATE: 11 mm/h (ref 0–20)

## 2017-12-21 DIAGNOSIS — M4722 Other spondylosis with radiculopathy, cervical region: Secondary | ICD-10-CM | POA: Diagnosis not present

## 2017-12-21 DIAGNOSIS — B951 Streptococcus, group B, as the cause of diseases classified elsewhere: Secondary | ICD-10-CM | POA: Diagnosis not present

## 2017-12-21 DIAGNOSIS — M4802 Spinal stenosis, cervical region: Secondary | ICD-10-CM | POA: Diagnosis not present

## 2017-12-21 DIAGNOSIS — M00212 Other streptococcal arthritis, left shoulder: Secondary | ICD-10-CM | POA: Diagnosis not present

## 2017-12-21 DIAGNOSIS — M60012 Infective myositis, left shoulder: Secondary | ICD-10-CM | POA: Diagnosis not present

## 2017-12-21 DIAGNOSIS — G7000 Myasthenia gravis without (acute) exacerbation: Secondary | ICD-10-CM | POA: Diagnosis not present

## 2017-12-22 ENCOUNTER — Telehealth: Payer: Self-pay | Admitting: Cardiovascular Disease

## 2017-12-22 ENCOUNTER — Ambulatory Visit: Payer: Medicare Other | Admitting: Physician Assistant

## 2017-12-22 DIAGNOSIS — M79604 Pain in right leg: Secondary | ICD-10-CM

## 2017-12-22 DIAGNOSIS — I739 Peripheral vascular disease, unspecified: Secondary | ICD-10-CM

## 2017-12-22 NOTE — Telephone Encounter (Addendum)
Spoke with the wife regarding the patient, per the patient's permission.   She stated that for the last 2 weeks the patient has been having right leg and foot swelling and pain. The pain is starting to increase in intensity to a 10/10 (sharp, burning pain) The pain is from his right foot to his calf. He denies any shortness of breath.  The right foot is cold to touch and numb in certain areas. She denied any discoloration. She stated that it hurts when the patient is sitting and ambulating but worse when ambulating.   The patient has an appointment with Jory Sims, NP tomorrow 11/14 and an appointment with Dr. Fletcher Anon on 11/26

## 2017-12-22 NOTE — Telephone Encounter (Signed)
° ° °  Pt c/o swelling: STAT is pt has developed SOB within 24 hours  1) How much weight have you gained and in what time span? n/a  2) If swelling, where is the swelling located? Right foot swollen, painful leg, numbness, cold  3) Are you currently taking a fluid pill? yes  4) Are you currently SOB? no  5) Do you have a log of your daily weights (if so, list)? n/a  6) Have you gained 3 pounds in a day or 5 pounds in a week? n/a  7) Have you traveled recently? no

## 2017-12-22 NOTE — Telephone Encounter (Signed)
The patient has been set up for a LEA and ABI at 1 pm tomorrow. The patient's wife has been made aware to keep the appointment at 11:30 with Jory Sims, NP. She has verbalized her understanding.

## 2017-12-22 NOTE — Telephone Encounter (Signed)
Schedule him for an ABI and right lower extremity arterial duplex to be done tomorrow.

## 2017-12-22 NOTE — Telephone Encounter (Signed)
Left a message for the patient to call back.  

## 2017-12-23 ENCOUNTER — Emergency Department (HOSPITAL_COMMUNITY): Payer: Medicare Other | Admitting: Certified Registered Nurse Anesthetist

## 2017-12-23 ENCOUNTER — Ambulatory Visit: Payer: Medicare Other | Admitting: Neurology

## 2017-12-23 ENCOUNTER — Ambulatory Visit (INDEPENDENT_AMBULATORY_CARE_PROVIDER_SITE_OTHER): Payer: Medicare Other | Admitting: Adult Health

## 2017-12-23 ENCOUNTER — Ambulatory Visit (HOSPITAL_BASED_OUTPATIENT_CLINIC_OR_DEPARTMENT_OTHER)
Admission: RE | Admit: 2017-12-23 | Discharge: 2017-12-23 | Disposition: A | Discharge status: Home / Self Care | Payer: Medicare Other | Source: Ambulatory Visit | Attending: Cardiovascular Disease | Admitting: Cardiovascular Disease

## 2017-12-23 ENCOUNTER — Encounter (HOSPITAL_COMMUNITY)
Admission: EM | Disposition: A | Discharge status: Home Health | Payer: Self-pay | Source: Ambulatory Visit | Attending: Vascular Surgery

## 2017-12-23 ENCOUNTER — Other Ambulatory Visit: Payer: Medicare Other | Admitting: *Deleted

## 2017-12-23 ENCOUNTER — Other Ambulatory Visit: Payer: Self-pay

## 2017-12-23 ENCOUNTER — Encounter (HOSPITAL_COMMUNITY): Payer: Self-pay | Admitting: Emergency Medicine

## 2017-12-23 ENCOUNTER — Telehealth: Payer: Self-pay | Admitting: Cardiovascular Disease

## 2017-12-23 ENCOUNTER — Inpatient Hospital Stay (HOSPITAL_COMMUNITY)
Admission: EM | Admit: 2017-12-23 | Discharge: 2017-12-28 | DRG: 253 | Disposition: A | Discharge status: Home Health | Payer: Medicare Other | Source: Ambulatory Visit | Attending: Vascular Surgery | Admitting: Vascular Surgery

## 2017-12-23 ENCOUNTER — Encounter: Payer: Self-pay | Admitting: Adult Health

## 2017-12-23 VITALS — BP 144/58 | HR 64 | Ht 67.0 in | Wt 170.6 lb

## 2017-12-23 DIAGNOSIS — Z888 Allergy status to other drugs, medicaments and biological substances status: Secondary | ICD-10-CM

## 2017-12-23 DIAGNOSIS — L7632 Postprocedural hematoma of skin and subcutaneous tissue following other procedure: Secondary | ICD-10-CM | POA: Diagnosis not present

## 2017-12-23 DIAGNOSIS — K3184 Gastroparesis: Secondary | ICD-10-CM | POA: Diagnosis present

## 2017-12-23 DIAGNOSIS — Z882 Allergy status to sulfonamides status: Secondary | ICD-10-CM

## 2017-12-23 DIAGNOSIS — I743 Embolism and thrombosis of arteries of the lower extremities: Secondary | ICD-10-CM | POA: Diagnosis present

## 2017-12-23 DIAGNOSIS — I2510 Atherosclerotic heart disease of native coronary artery without angina pectoris: Secondary | ICD-10-CM

## 2017-12-23 DIAGNOSIS — K5730 Diverticulosis of large intestine without perforation or abscess without bleeding: Secondary | ICD-10-CM | POA: Diagnosis present

## 2017-12-23 DIAGNOSIS — M810 Age-related osteoporosis without current pathological fracture: Secondary | ICD-10-CM | POA: Diagnosis present

## 2017-12-23 DIAGNOSIS — I130 Hypertensive heart and chronic kidney disease with heart failure and stage 1 through stage 4 chronic kidney disease, or unspecified chronic kidney disease: Secondary | ICD-10-CM | POA: Diagnosis present

## 2017-12-23 DIAGNOSIS — D62 Acute posthemorrhagic anemia: Secondary | ICD-10-CM | POA: Diagnosis not present

## 2017-12-23 DIAGNOSIS — I639 Cerebral infarction, unspecified: Secondary | ICD-10-CM

## 2017-12-23 DIAGNOSIS — M79604 Pain in right leg: Secondary | ICD-10-CM

## 2017-12-23 DIAGNOSIS — Z9842 Cataract extraction status, left eye: Secondary | ICD-10-CM

## 2017-12-23 DIAGNOSIS — E1151 Type 2 diabetes mellitus with diabetic peripheral angiopathy without gangrene: Principal | ICD-10-CM | POA: Diagnosis present

## 2017-12-23 DIAGNOSIS — I739 Peripheral vascular disease, unspecified: Secondary | ICD-10-CM

## 2017-12-23 DIAGNOSIS — E1143 Type 2 diabetes mellitus with diabetic autonomic (poly)neuropathy: Secondary | ICD-10-CM | POA: Diagnosis present

## 2017-12-23 DIAGNOSIS — Z86718 Personal history of other venous thrombosis and embolism: Secondary | ICD-10-CM

## 2017-12-23 DIAGNOSIS — Y838 Other surgical procedures as the cause of abnormal reaction of the patient, or of later complication, without mention of misadventure at the time of the procedure: Secondary | ICD-10-CM | POA: Diagnosis not present

## 2017-12-23 DIAGNOSIS — I5032 Chronic diastolic (congestive) heart failure: Secondary | ICD-10-CM | POA: Diagnosis present

## 2017-12-23 DIAGNOSIS — G4700 Insomnia, unspecified: Secondary | ICD-10-CM | POA: Diagnosis present

## 2017-12-23 DIAGNOSIS — M549 Dorsalgia, unspecified: Secondary | ICD-10-CM | POA: Diagnosis present

## 2017-12-23 DIAGNOSIS — I69349 Monoplegia of lower limb following cerebral infarction affecting unspecified side: Secondary | ICD-10-CM

## 2017-12-23 DIAGNOSIS — I69393 Ataxia following cerebral infarction: Secondary | ICD-10-CM | POA: Diagnosis not present

## 2017-12-23 DIAGNOSIS — Z86711 Personal history of pulmonary embolism: Secondary | ICD-10-CM | POA: Diagnosis not present

## 2017-12-23 DIAGNOSIS — G8918 Other acute postprocedural pain: Secondary | ICD-10-CM | POA: Diagnosis not present

## 2017-12-23 DIAGNOSIS — E782 Mixed hyperlipidemia: Secondary | ICD-10-CM | POA: Diagnosis present

## 2017-12-23 DIAGNOSIS — H409 Unspecified glaucoma: Secondary | ICD-10-CM | POA: Diagnosis present

## 2017-12-23 DIAGNOSIS — I998 Other disorder of circulatory system: Secondary | ICD-10-CM

## 2017-12-23 DIAGNOSIS — Z8673 Personal history of transient ischemic attack (TIA), and cerebral infarction without residual deficits: Secondary | ICD-10-CM | POA: Diagnosis not present

## 2017-12-23 DIAGNOSIS — I482 Chronic atrial fibrillation, unspecified: Secondary | ICD-10-CM | POA: Diagnosis present

## 2017-12-23 DIAGNOSIS — E7800 Pure hypercholesterolemia, unspecified: Secondary | ICD-10-CM | POA: Diagnosis not present

## 2017-12-23 DIAGNOSIS — Z9841 Cataract extraction status, right eye: Secondary | ICD-10-CM

## 2017-12-23 DIAGNOSIS — I10 Essential (primary) hypertension: Secondary | ICD-10-CM | POA: Diagnosis not present

## 2017-12-23 DIAGNOSIS — F40240 Claustrophobia: Secondary | ICD-10-CM | POA: Diagnosis present

## 2017-12-23 DIAGNOSIS — E1122 Type 2 diabetes mellitus with diabetic chronic kidney disease: Secondary | ICD-10-CM | POA: Diagnosis present

## 2017-12-23 DIAGNOSIS — N183 Chronic kidney disease, stage 3 unspecified: Secondary | ICD-10-CM

## 2017-12-23 DIAGNOSIS — M7989 Other specified soft tissue disorders: Secondary | ICD-10-CM

## 2017-12-23 DIAGNOSIS — K219 Gastro-esophageal reflux disease without esophagitis: Secondary | ICD-10-CM | POA: Diagnosis present

## 2017-12-23 DIAGNOSIS — Z79899 Other long term (current) drug therapy: Secondary | ICD-10-CM

## 2017-12-23 DIAGNOSIS — G8929 Other chronic pain: Secondary | ICD-10-CM | POA: Diagnosis present

## 2017-12-23 DIAGNOSIS — D497 Neoplasm of unspecified behavior of endocrine glands and other parts of nervous system: Secondary | ICD-10-CM | POA: Diagnosis present

## 2017-12-23 DIAGNOSIS — Z8249 Family history of ischemic heart disease and other diseases of the circulatory system: Secondary | ICD-10-CM

## 2017-12-23 DIAGNOSIS — R609 Edema, unspecified: Secondary | ICD-10-CM | POA: Diagnosis not present

## 2017-12-23 DIAGNOSIS — N184 Chronic kidney disease, stage 4 (severe): Secondary | ICD-10-CM | POA: Diagnosis present

## 2017-12-23 DIAGNOSIS — I7090 Unspecified atherosclerosis: Secondary | ICD-10-CM | POA: Diagnosis not present

## 2017-12-23 DIAGNOSIS — C9210 Chronic myeloid leukemia, BCR/ABL-positive, not having achieved remission: Secondary | ICD-10-CM | POA: Diagnosis present

## 2017-12-23 DIAGNOSIS — Z87442 Personal history of urinary calculi: Secondary | ICD-10-CM

## 2017-12-23 DIAGNOSIS — I771 Stricture of artery: Secondary | ICD-10-CM | POA: Diagnosis present

## 2017-12-23 DIAGNOSIS — Z823 Family history of stroke: Secondary | ICD-10-CM

## 2017-12-23 DIAGNOSIS — G7000 Myasthenia gravis without (acute) exacerbation: Secondary | ICD-10-CM | POA: Diagnosis present

## 2017-12-23 DIAGNOSIS — Z7901 Long term (current) use of anticoagulants: Secondary | ICD-10-CM

## 2017-12-23 DIAGNOSIS — Z7952 Long term (current) use of systemic steroids: Secondary | ICD-10-CM

## 2017-12-23 DIAGNOSIS — Z803 Family history of malignant neoplasm of breast: Secondary | ICD-10-CM

## 2017-12-23 DIAGNOSIS — M1990 Unspecified osteoarthritis, unspecified site: Secondary | ICD-10-CM | POA: Diagnosis present

## 2017-12-23 DIAGNOSIS — L8990 Pressure ulcer of unspecified site, unspecified stage: Secondary | ICD-10-CM

## 2017-12-23 DIAGNOSIS — Z8701 Personal history of pneumonia (recurrent): Secondary | ICD-10-CM

## 2017-12-23 DIAGNOSIS — E119 Type 2 diabetes mellitus without complications: Secondary | ICD-10-CM | POA: Diagnosis not present

## 2017-12-23 HISTORY — PX: AORTOGRAM: SHX6300

## 2017-12-23 LAB — PREPARE RBC (CROSSMATCH)

## 2017-12-23 LAB — BASIC METABOLIC PANEL
ANION GAP: 8 (ref 5–15)
BUN / CREAT RATIO: 12 (ref 10–24)
BUN: 19 mg/dL (ref 8–27)
BUN: 20 mg/dL (ref 8–23)
CALCIUM: 9.4 mg/dL (ref 8.9–10.3)
CO2: 27 mmol/L (ref 20–29)
CO2: 29 mmol/L (ref 22–32)
CREATININE: 1.64 mg/dL — AB (ref 0.76–1.27)
Calcium: 9.4 mg/dL (ref 8.6–10.2)
Chloride: 101 mmol/L (ref 96–106)
Chloride: 103 mmol/L (ref 98–111)
Creatinine, Ser: 1.91 mg/dL — ABNORMAL HIGH (ref 0.61–1.24)
GFR, EST AFRICAN AMERICAN: 45 mL/min/{1.73_m2} — AB (ref >59)
GFR, EST AFRICAN AMERICAN: 37 mL/min — AB (ref >60)
GFR, EST NON AFRICAN AMERICAN: 39 mL/min/{1.73_m2} — AB (ref >59)
GFR, EST NON AFRICAN AMERICAN: 32 mL/min — AB (ref >60)
Glucose, Bld: 126 mg/dL — ABNORMAL HIGH (ref 70–99)
Glucose: 122 mg/dL — ABNORMAL HIGH (ref 65–99)
Potassium: 4.2 mmol/L (ref 3.5–5.2)
Potassium: 4.1 mmol/L (ref 3.5–5.1)
SODIUM: 141 mmol/L (ref 134–144)
Sodium: 140 mmol/L (ref 135–145)

## 2017-12-23 LAB — CBC WITH DIFFERENTIAL/PLATELET
Abs Immature Granulocytes: 0.03 10*3/uL (ref 0.00–0.07)
BASOS ABS: 0 10*3/uL (ref 0.0–0.1)
Basophils Relative: 0 %
EOS ABS: 0 10*3/uL (ref 0.0–0.5)
Eosinophils Relative: 0 %
HCT: 33.2 % — ABNORMAL LOW (ref 39.0–52.0)
Hemoglobin: 10.4 g/dL — ABNORMAL LOW (ref 13.0–17.0)
IMMATURE GRANULOCYTES: 0 %
Lymphocytes Relative: 18 %
Lymphs Abs: 1.7 10*3/uL (ref 0.7–4.0)
MCH: 32.7 pg (ref 26.0–34.0)
MCHC: 31.3 g/dL (ref 30.0–36.0)
MCV: 104.4 fL — AB (ref 80.0–100.0)
Monocytes Absolute: 0.9 10*3/uL (ref 0.1–1.0)
Monocytes Relative: 10 %
NEUTROS PCT: 72 %
NRBC: 0 % (ref 0.0–0.2)
Neutro Abs: 6.4 10*3/uL (ref 1.7–7.7)
PLATELETS: 227 10*3/uL (ref 150–400)
RBC: 3.18 MIL/uL — AB (ref 4.22–5.81)
RDW: 14.6 % (ref 11.5–15.5)
WBC: 9.1 10*3/uL (ref 4.0–10.5)

## 2017-12-23 LAB — APTT: APTT: 31 s (ref 24–36)

## 2017-12-23 LAB — HEPARIN LEVEL (UNFRACTIONATED): Heparin Unfractionated: >2.2 IU/mL — ABNORMAL HIGH (ref 0.30–0.70)

## 2017-12-23 LAB — PROTIME-INR
INR: 1.42
Prothrombin Time: 17.2 seconds — ABNORMAL HIGH (ref 11.4–15.2)

## 2017-12-23 SURGERY — AORTOGRAM
Anesthesia: General | Laterality: Right

## 2017-12-23 MED ORDER — SODIUM CHLORIDE 0.9 % IV SOLN: 10.0000 mL/h | Freq: Once | INTRAVENOUS | Status: DC

## 2017-12-23 MED ORDER — SODIUM CHLORIDE 0.9 % IV SOLN
INTRAVENOUS | Status: DC | PRN
  Administered 2017-12-23: 500 mL

## 2017-12-23 MED ORDER — CEFAZOLIN SODIUM-DEXTROSE 2-4 GM/100ML-% IV SOLN
INTRAVENOUS | Status: AC
  Filled 2017-12-23: qty 100

## 2017-12-23 MED ORDER — PROPOFOL 10 MG/ML IV BOLUS
INTRAVENOUS | Status: DC | PRN
  Administered 2017-12-23: 120 mg via INTRAVENOUS

## 2017-12-23 MED ORDER — CEFAZOLIN SODIUM-DEXTROSE 2-4 GM/100ML-% IV SOLN
2.0000 g | Freq: Once | INTRAVENOUS | Status: AC
  Administered 2017-12-23 (×2): 2 g via INTRAVENOUS

## 2017-12-23 MED ORDER — PROPOFOL 1000 MG/100ML IV EMUL
INTRAVENOUS | Status: AC
  Filled 2017-12-23: qty 100

## 2017-12-23 MED ORDER — SODIUM CHLORIDE 0.9 % IV SOLN
INTRAVENOUS | Status: DC | PRN
  Administered 2017-12-23 (×2): via INTRAVENOUS

## 2017-12-23 MED ORDER — HEPARIN SODIUM (PORCINE) 1000 UNIT/ML IJ SOLN
INTRAMUSCULAR | Status: AC
  Filled 2017-12-23: qty 1

## 2017-12-23 MED ORDER — SUGAMMADEX SODIUM 500 MG/5ML IV SOLN
INTRAVENOUS | Status: AC
  Filled 2017-12-23: qty 5

## 2017-12-23 MED ORDER — HEPARIN (PORCINE) 25000 UT/250ML-% IV SOLN
800.0000 [IU]/h | INTRAVENOUS | Status: DC
  Administered 2017-12-24: 800 [IU]/h via INTRAVENOUS
  Filled 2017-12-23 (×2): qty 250

## 2017-12-23 MED ORDER — SODIUM CHLORIDE 0.9 % IV SOLN
INTRAVENOUS | Status: DC | PRN
  Administered 2017-12-23: 21:00:00 via INTRAVENOUS
  Administered 2017-12-23: 50 ug/min via INTRAVENOUS

## 2017-12-23 MED ORDER — FENTANYL CITRATE (PF) 250 MCG/5ML IJ SOLN
INTRAMUSCULAR | Status: AC
  Filled 2017-12-23: qty 5

## 2017-12-23 MED ORDER — MIDAZOLAM HCL 2 MG/2ML IJ SOLN
INTRAMUSCULAR | Status: DC | PRN
  Administered 2017-12-23: 2 mg via INTRAVENOUS

## 2017-12-23 MED ORDER — SODIUM CHLORIDE 0.9 % IV SOLN
INTRAVENOUS | Status: AC
  Filled 2017-12-23: qty 1.2

## 2017-12-23 MED ORDER — GLYCOPYRROLATE PF 0.2 MG/ML IJ SOSY
PREFILLED_SYRINGE | INTRAMUSCULAR | Status: AC
  Filled 2017-12-23: qty 1

## 2017-12-23 MED ORDER — FENTANYL CITRATE (PF) 100 MCG/2ML IJ SOLN
INTRAMUSCULAR | Status: DC | PRN
  Administered 2017-12-23: 25 ug via INTRAVENOUS
  Administered 2017-12-23: 150 ug via INTRAVENOUS

## 2017-12-23 MED ORDER — PHENYLEPHRINE 40 MCG/ML (10ML) SYRINGE FOR IV PUSH (FOR BLOOD PRESSURE SUPPORT)
PREFILLED_SYRINGE | INTRAVENOUS | Status: AC
  Filled 2017-12-23: qty 30

## 2017-12-23 MED ORDER — IODIXANOL 320 MG/ML IV SOLN
INTRAVENOUS | Status: DC | PRN
  Administered 2017-12-23: 50 mL via INTRA_ARTERIAL

## 2017-12-23 MED ORDER — ALBUMIN HUMAN 5 % IV SOLN
INTRAVENOUS | Status: DC | PRN
  Administered 2017-12-23 (×2): via INTRAVENOUS

## 2017-12-23 MED ORDER — PHENYLEPHRINE 40 MCG/ML (10ML) SYRINGE FOR IV PUSH (FOR BLOOD PRESSURE SUPPORT)
PREFILLED_SYRINGE | INTRAVENOUS | Status: DC | PRN
  Administered 2017-12-23: 40 ug via INTRAVENOUS
  Administered 2017-12-23: 80 ug via INTRAVENOUS

## 2017-12-23 MED ORDER — ROCURONIUM BROMIDE 50 MG/5ML IV SOSY
PREFILLED_SYRINGE | INTRAVENOUS | Status: AC
  Filled 2017-12-23: qty 15

## 2017-12-23 MED ORDER — SODIUM CHLORIDE (PF) 0.9 % IJ SOLN: INTRAVENOUS | Status: DC | PRN

## 2017-12-23 MED ORDER — LIDOCAINE 2% (20 MG/ML) 5 ML SYRINGE
INTRAMUSCULAR | Status: AC
  Filled 2017-12-23: qty 15

## 2017-12-23 MED ORDER — ONDANSETRON HCL 4 MG/2ML IJ SOLN
INTRAMUSCULAR | Status: AC
  Filled 2017-12-23: qty 2

## 2017-12-23 MED ORDER — EPHEDRINE SULFATE 50 MG/ML IJ SOLN
INTRAMUSCULAR | Status: DC | PRN
  Administered 2017-12-23: 15 mg via INTRAVENOUS

## 2017-12-23 MED ORDER — ROCURONIUM BROMIDE 10 MG/ML (PF) SYRINGE
PREFILLED_SYRINGE | INTRAVENOUS | Status: DC | PRN
  Administered 2017-12-23 (×10): 10 mg via INTRAVENOUS
  Administered 2017-12-23: 50 mg via INTRAVENOUS

## 2017-12-23 MED ORDER — LIDOCAINE 2% (20 MG/ML) 5 ML SYRINGE
INTRAMUSCULAR | Status: DC | PRN
  Administered 2017-12-23: 100 mg via INTRAVENOUS

## 2017-12-23 MED ORDER — SUGAMMADEX SODIUM 200 MG/2ML IV SOLN
INTRAVENOUS | Status: DC | PRN
  Administered 2017-12-23: 200 mg via INTRAVENOUS

## 2017-12-23 MED ORDER — IODIXANOL 320 MG/ML IV SOLN
INTRAVENOUS | Status: DC | PRN
  Administered 2017-12-23: 30 mL via INTRA_ARTERIAL

## 2017-12-23 MED ORDER — SUCCINYLCHOLINE CHLORIDE 200 MG/10ML IV SOSY: PREFILLED_SYRINGE | INTRAVENOUS | Status: DC | PRN

## 2017-12-23 MED ORDER — MIDAZOLAM HCL 2 MG/2ML IJ SOLN
INTRAMUSCULAR | Status: AC
  Filled 2017-12-23: qty 2

## 2017-12-23 MED ORDER — HEPARIN SODIUM (PORCINE) 1000 UNIT/ML IJ SOLN
INTRAMUSCULAR | Status: DC | PRN
  Administered 2017-12-23 (×2): 2000 [IU] via INTRAVENOUS
  Administered 2017-12-23: 5000 [IU] via INTRAVENOUS
  Administered 2017-12-23: 2000 [IU] via INTRAVENOUS
  Administered 2017-12-23: 3000 [IU] via INTRAVENOUS

## 2017-12-23 MED ORDER — CEFAZOLIN SODIUM 1 G IJ SOLR
INTRAMUSCULAR | Status: AC
  Filled 2017-12-23: qty 20

## 2017-12-23 MED ORDER — LACTATED RINGERS IV SOLN
INTRAVENOUS | Status: DC | PRN
  Administered 2017-12-23 (×3): via INTRAVENOUS

## 2017-12-23 MED ORDER — 0.9 % SODIUM CHLORIDE (POUR BTL) OPTIME
TOPICAL | Status: DC | PRN
  Administered 2017-12-23: 1000 mL

## 2017-12-23 MED ORDER — SODIUM CHLORIDE 0.9 % IV SOLN
INTRAVENOUS | Status: DC | PRN
  Administered 2017-12-23: 18:00:00

## 2017-12-23 MED ORDER — PROPOFOL 10 MG/ML IV BOLUS
INTRAVENOUS | Status: AC
  Filled 2017-12-23: qty 20

## 2017-12-23 SURGICAL SUPPLY — 135 items
ADH SKN CLS APL DERMABOND .7 (GAUZE/BANDAGES/DRESSINGS) ×1
AGENT HMST SPONGE THK3/8 (HEMOSTASIS)
BAG BANDED W/RUBBER/TAPE 36X54 (MISCELLANEOUS) ×6 IMPLANT
BAG SNAP BAND KOVER 36X36 (MISCELLANEOUS) ×3 IMPLANT
BALLN MUSTANG 4.0X20 75 (BALLOONS) ×2
BALLN MUSTANG 4.0X20 75CM (BALLOONS) ×1
BALLN MUSTANG 4X100X135 (BALLOONS) ×3
BALLN MUSTANG 5X100X135 (BALLOONS) ×3
BALLN MUSTANG 5X150X135 (BALLOONS) ×3
BALLN STERLING OTW 3X150X150 (BALLOONS) ×3
BALLN STERLING OTW 4X220X150 (BALLOONS) ×3
BALLN STERLING OTW 5X100X135 (BALLOONS) ×3
BALLN STERLING RX 5X20X80 (BALLOONS) ×3
BALLN STERLING RX 5X40X80 (BALLOONS) ×3
BALLN STERLING RX 6X30X80 (BALLOONS) ×3
BALLN STERLING SL 4X100X150 (BALLOONS) ×3
BALLOON MUSTANG 4.0X20 75 (BALLOONS) ×1 IMPLANT
BALLOON MUSTANG 4X100X135 (BALLOONS) ×1 IMPLANT
BALLOON MUSTANG 5X100X135 (BALLOONS) ×1 IMPLANT
BALLOON MUSTANG 5X150X135 (BALLOONS) ×1 IMPLANT
BALLOON STERLING OTW 3X150X150 (BALLOONS) ×1 IMPLANT
BALLOON STERLING OTW 4X220X150 (BALLOONS) ×1 IMPLANT
BALLOON STERLING OTW 5X100X135 (BALLOONS) ×1 IMPLANT
BALLOON STERLING RX 5X20X80 (BALLOONS) ×1 IMPLANT
BALLOON STERLING RX 5X40X80 (BALLOONS) ×1 IMPLANT
BALLOON STERLING RX 6X30X80 (BALLOONS) ×1 IMPLANT
BALLOON STERLING SL 4X100X150 (BALLOONS) ×1 IMPLANT
BANDAGE ESMARK 6X9 LF (GAUZE/BANDAGES/DRESSINGS) IMPLANT
BLADE SURG 11 STRL SS (BLADE) IMPLANT
BLADE SURG 15 STRL LF DISP TIS (BLADE) ×1 IMPLANT
BLADE SURG 15 STRL SS (BLADE) ×3
BNDG CMPR 9X6 STRL LF SNTH (GAUZE/BANDAGES/DRESSINGS)
BNDG ESMARK 6X9 LF (GAUZE/BANDAGES/DRESSINGS)
CANISTER SUCT 3000ML PPV (MISCELLANEOUS) ×3 IMPLANT
CATH ANGIO 5F BER 65CM (CATHETERS) IMPLANT
CATH CXI SUPP 2.6F 150 ST (CATHETERS) ×3 IMPLANT
CATH OMNI FLUSH .035X70CM (CATHETERS) IMPLANT
CATH OMNI FLUSH 5F 65CM (CATHETERS) ×3 IMPLANT
CATH QUICKCROSS .018X135CM (MICROCATHETER) ×3 IMPLANT
CATH QUICKCROSS .035X135CM (MICROCATHETER) ×9 IMPLANT
CLIP FOGARTY SPRING 6M (CLIP) IMPLANT
CLIP VESOCCLUDE MED 24/CT (CLIP) ×3 IMPLANT
CLIP VESOCCLUDE MED 6/CT (CLIP) ×3 IMPLANT
CLIP VESOCCLUDE SM WIDE 24/CT (CLIP) IMPLANT
CLIP VESOCCLUDE SM WIDE 6/CT (CLIP) ×3 IMPLANT
COVER BACK TABLE 80X110 HD (DRAPES) ×6 IMPLANT
COVER DOME SNAP 22 D (MISCELLANEOUS) ×3 IMPLANT
COVER PROBE W GEL 5X96 (DRAPES) ×9 IMPLANT
COVER SURGICAL LIGHT HANDLE (MISCELLANEOUS) IMPLANT
COVER TRANSDUCER ULTRASND GEL (DRAPE) ×3 IMPLANT
COVER WAND RF STERILE (DRAPES) IMPLANT
CUFF TOURNIQUET SINGLE 24IN (TOURNIQUET CUFF) IMPLANT
CUFF TOURNIQUET SINGLE 34IN LL (TOURNIQUET CUFF) IMPLANT
CUFF TOURNIQUET SINGLE 44IN (TOURNIQUET CUFF) IMPLANT
DERMABOND ADVANCED (GAUZE/BANDAGES/DRESSINGS) ×2
DERMABOND ADVANCED .7 DNX12 (GAUZE/BANDAGES/DRESSINGS) ×1 IMPLANT
DEVICE CLOSURE PERCLS PRGLD 6F (VASCULAR PRODUCTS) ×2 IMPLANT
DEVICE TORQUE KENDALL .025-038 (MISCELLANEOUS) ×3 IMPLANT
DRAIN CHANNEL 15F RND FF W/TCR (WOUND CARE) ×3 IMPLANT
DRAPE C-ARM 42X72 X-RAY (DRAPES) IMPLANT
DRAPE FEMORAL ANGIO 80X135IN (DRAPES) IMPLANT
DRAPE HALF SHEET 40X57 (DRAPES) IMPLANT
ELECT REM PT RETURN 9FT ADLT (ELECTROSURGICAL) ×3
ELECTRODE REM PT RTRN 9FT ADLT (ELECTROSURGICAL) ×1 IMPLANT
EVACUATOR SILICONE 100CC (DRAIN) IMPLANT
FILTER CO2 0.2 MICRON (VASCULAR PRODUCTS) IMPLANT
FILTER CO2 INSUFFLATOR AX1008 (MISCELLANEOUS) IMPLANT
GAUZE 4X4 16PLY RFD (DISPOSABLE) ×3 IMPLANT
GLIDEWIRE ADV .035X260CM (WIRE) ×3 IMPLANT
GLOVE BIO SURGEON STRL SZ7.5 (GLOVE) ×6 IMPLANT
GLOVE BIOGEL PI IND STRL 7.5 (GLOVE) ×4 IMPLANT
GLOVE BIOGEL PI IND STRL 8 (GLOVE) ×2 IMPLANT
GLOVE BIOGEL PI INDICATOR 7.5 (GLOVE) ×8
GLOVE BIOGEL PI INDICATOR 8 (GLOVE) ×4
GLOVE ECLIPSE 7.0 STRL STRAW (GLOVE) ×6 IMPLANT
GLOVE SURG SS PI 7.5 STRL IVOR (GLOVE) ×6 IMPLANT
GOWN STRL REUS W/ TWL LRG LVL3 (GOWN DISPOSABLE) ×6 IMPLANT
GOWN STRL REUS W/ TWL XL LVL3 (GOWN DISPOSABLE) ×2 IMPLANT
GOWN STRL REUS W/TWL LRG LVL3 (GOWN DISPOSABLE) ×12
GOWN STRL REUS W/TWL XL LVL3 (GOWN DISPOSABLE) ×6
GUIDEWIRE ANGLED .035X150CM (WIRE) IMPLANT
HEMOSTAT SPONGE AVITENE ULTRA (HEMOSTASIS) IMPLANT
INSERT FOGARTY SM (MISCELLANEOUS) IMPLANT
KIT BASIN OR (CUSTOM PROCEDURE TRAY) ×3 IMPLANT
KIT ENCORE 26 ADVANTAGE (KITS) ×3 IMPLANT
KIT TURNOVER KIT B (KITS) ×3 IMPLANT
NEEDLE PERC 18GX7CM (NEEDLE) IMPLANT
NS IRRIG 1000ML POUR BTL (IV SOLUTION) ×6 IMPLANT
PACK PERIPHERAL VASCULAR (CUSTOM PROCEDURE TRAY) ×3 IMPLANT
PAD ARMBOARD 7.5X6 YLW CONV (MISCELLANEOUS) ×6 IMPLANT
PERCLOSE PROGLIDE 6F (VASCULAR PRODUCTS) ×6
PROTECTION STATION PRESSURIZED (MISCELLANEOUS) ×3
SET FLUSH CO2 (MISCELLANEOUS) ×3 IMPLANT
SET MICROPUNCTURE 5F STIFF (MISCELLANEOUS) ×3 IMPLANT
SHEATH AVANTI 11CM 5FR (SHEATH) IMPLANT
SHEATH FLEXOR ANSEL 1 7F 45CM (SHEATH) ×3 IMPLANT
SHEATH FLEXOR INTRODUCER 8FR (SHEATH) ×3 IMPLANT
SHEATH PINNACLE 6F 10CM (SHEATH) ×3 IMPLANT
SHEATH SHUTTLE 7FR (SHEATH) ×3 IMPLANT
STATION PROTECTION PRESSURIZED (MISCELLANEOUS) ×1 IMPLANT
STENT INNOVA 6X150X130 (Permanent Stent) ×6 IMPLANT
STENT TIGRIS 5X100X120 (Permanent Stent) ×3 IMPLANT
STOPCOCK 4 WAY LG BORE MALE ST (IV SETS) ×3 IMPLANT
STOPCOCK MORSE 400PSI 3WAY (MISCELLANEOUS) ×3 IMPLANT
SUT ETHILON 3 0 PS 1 (SUTURE) ×3 IMPLANT
SUT GORETEX 5 0 TT13 24 (SUTURE) IMPLANT
SUT GORETEX 6.0 TT13 (SUTURE) IMPLANT
SUT MNCRL AB 4-0 PS2 18 (SUTURE) ×3 IMPLANT
SUT PROLENE 5 0 C 1 24 (SUTURE) ×6 IMPLANT
SUT PROLENE 6 0 BV (SUTURE) ×15 IMPLANT
SUT PROLENE 7 0 BV 1 (SUTURE) IMPLANT
SUT SILK 2 0 PERMA HAND 18 BK (SUTURE) IMPLANT
SUT SILK 3 0 (SUTURE)
SUT SILK 3-0 18XBRD TIE 12 (SUTURE) IMPLANT
SUT VIC AB 2-0 CT1 27 (SUTURE) ×6
SUT VIC AB 2-0 CT1 TAPERPNT 27 (SUTURE) ×2 IMPLANT
SUT VIC AB 3-0 SH 27 (SUTURE) ×3
SUT VIC AB 3-0 SH 27X BRD (SUTURE) ×1 IMPLANT
SYR 10ML LL (SYRINGE) ×9 IMPLANT
SYR 30ML LL (SYRINGE) IMPLANT
SYR 50ML LL SCALE MARK (SYRINGE) ×3 IMPLANT
SYR BULB IRRIGATION 50ML (SYRINGE) ×3 IMPLANT
SYR MEDRAD MARK V 150ML (SYRINGE) IMPLANT
SYRINGE 20CC LL (MISCELLANEOUS) ×6 IMPLANT
TAPE STRIPS DRAPE STRL (GAUZE/BANDAGES/DRESSINGS) ×3 IMPLANT
TAPE UMBILICAL COTTON 1/8X30 (MISCELLANEOUS) IMPLANT
TOWEL GREEN STERILE (TOWEL DISPOSABLE) ×6 IMPLANT
TRAY FOLEY MTR SLVR 16FR STAT (SET/KITS/TRAYS/PACK) ×3 IMPLANT
TUBING EXTENTION W/L.L. (IV SETS) IMPLANT
TUBING HIGH PRESSURE 120CM (CONNECTOR) ×3 IMPLANT
UNDERPAD 30X30 (UNDERPADS AND DIAPERS) ×3 IMPLANT
WATER STERILE IRR 1000ML POUR (IV SOLUTION) ×3 IMPLANT
WIRE AMPLATZ SS-J .035X260CM (WIRE) ×6 IMPLANT
WIRE BENTSON .035X145CM (WIRE) ×3 IMPLANT
WIRE G V18X300CM (WIRE) ×12 IMPLANT

## 2017-12-23 NOTE — Progress Notes (Signed)
Cardiology Office Note   Date:  12/23/2017   ID:  ROWE [name], DOB 05/16/1940, MRN [id number]  PCP:  Tristan Maw, MD  Cardiologist:  Dr. Stanford Breed  Chief Complaint  Patient presents with  . PAD  . Leg Pain     History of Present Illness: [name] is a 77 y.o. male who presents for ongoing assessment and management of CAD with severe distal vessel disease treated medically, chronic diastolic CHF, with other history to include chronic myelogenous leukemia in remission, PAD s/p angioplasty and stenting of the left SFA in 11/2016, recurrent CVA, DVT with pulmonary embolism on anticoagulation. He is on long term steroid therapy.   He was last seen by Tristan Dopp, PA for LEE and dyspnea. He was to continue current regimen, wear support hose. Multiple labs were drawn to include albumin and kidney function, and lasix was to be increased if kidney function allowed. He called our office on 12/22/2017 for worsening LE and dyspnea. He was ordered ABI's with LEA which is to be completed today after appointment.    He is here today because he is having worsening pain in the right leg and foot. He states that he cannot feel his foot. He has stubbed his second toe with bluish discoloration. He states that he is unable to walk on his right foot due to lack of feeling. He is also see more edema.  Past Medical History:  Diagnosis Date  . Bruises easily    d/t being on Eliquis  . Carotid artery disease ([place])    a. Duplex 05/2014: 56-38% RICA, 7-56% LICA, elevated velocities in right subclavian artery, normal left subclavian artery..  . Chronic back pain    HNP  . Chronic kidney disease (CKD)   . Claustrophobia    takes Ativan as needed  . CML (chronic myeloid leukemia) ([place])    takes Gleevec daily  . Coronary atherosclerosis of unspecified type of vessel, native or graft    a. cath in 2003 showed 10-20% LM, scattered 20% prox-mid LAD, 30% more distal LAD, 50-60% stenosis of LAD towards  apex, 20% Cx, 30% dRCA, focal 95% stenosis in smaller of 2 branches of PDA, EF 60-65%.  . Diabetes mellitus ([place])   . Diarrhea    takes Imodium daily as needed  . Diverticulosis   . Diverticulosis of colon (without mention of hemorrhage)   . DJD (degenerative joint disease)   . DVT (deep venous thrombosis) ([place]) 07/2011  . Esophageal stricture   . Essential hypertension    takes Imdur and Lisinopril daily  . Gastric polyps    benign  . GERD (gastroesophageal reflux disease)    takes Nexium daily  . Glaucoma    uses eye drops  . History of bronchitis    not sure when the last time  . History of colon polyps    benign  . History of kidney stones   . History of kidney stones   . Insomnia    takes Melatonin nightly as needed  . Mixed hyperlipidemia    takes Crestor daily  . Myasthenia gravis ([place])    uses prednisone  . Osteoporosis 2016  . Peripheral edema    takes Lasix daily  . Pituitary tumor    checks Dr.Walden at [facility] checks it every Jan   . Pneumonia 01/2016  . PONV (postoperative nausea and vomiting)   . Ptosis   . Pulmonary embolus ([place]) 2012  . Stroke [facility]) 06/2015  Past Surgical History:  Procedure Laterality Date  . ABDOMINAL AORTOGRAM W/LOWER EXTREMITY N/A 11/18/2016   Procedure: ABDOMINAL AORTOGRAM W/LOWER EXTREMITY;  Surgeon: Tristan Hampshire, MD;  Location: [facility];  Service: Cardiovascular;  Laterality: N/A;  . BACK SURGERY     x2, lumbar and cervical  . CARDIAC CATHETERIZATION    . CATARACT EXTRACTION Bilateral 12/12  . COLONOSCOPY    . ESOPHAGOGASTRODUODENOSCOPY ENDOSCOPY  04/2015  . IR GENERIC HISTORICAL  03/03/2016   IR IVC FILTER PLMT / S&I [name] GUID/MOD SED 03/03/2016 Tristan Keen, MD [facility]  . IR GENERIC HISTORICAL  04/09/2016   IR RADIOLOGIST EVAL & MGMT 04/09/2016 Tristan Keen, MD [facility]  . IR GENERIC HISTORICAL  04/30/2016   IR IVC FILTER RETRIEVAL / S&I [name] GUID/MOD SED 04/30/2016 Tristan Keen, MD [facility]  [facility]  . LITHOTRIPSY    . LUMBAR LAMINECTOMY/DECOMPRESSION MICRODISCECTOMY Right 09/14/2012   Procedure: Right Lumbar three-four, four-five, Lumbar five-Sacral one decompressive laminectomy;  Surgeon: Tristan Moore, MD;  Location: [facility];  Service: Neurosurgery;  Laterality: Right;  . LUMBAR LAMINECTOMY/DECOMPRESSION MICRODISCECTOMY Left 03/11/2016   Procedure: Left Lumbar Two-ThreeMicrodiscectomy;  Surgeon: Tristan Moore, MD;  Location: [place];  Service: Neurosurgery;  Laterality: Left;  . PERIPHERAL VASCULAR INTERVENTION  11/18/2016   Procedure: PERIPHERAL VASCULAR INTERVENTION;  Surgeon: Tristan Hampshire, MD;  Location: [facility];  Service: Cardiovascular;;  Left popliteal  . SHOULDER SURGERY Left 05/07/2009  . TONSILLECTOMY       Current Outpatient Medications  Medication Sig Dispense Refill  . blood glucose meter kit and supplies KIT Dispense based on patient and insurance preference. Use up to four times daily as directed. (FOR ICD-9 250.00, 250.01). 1 each 0  . brimonidine-timolol (COMBIGAN) 0.2-0.5 % ophthalmic solution Place 1 drop into both eyes every 12 (twelve) hours.    . cholecalciferol (VITAMIN D) 1000 units tablet Take 1,000 Units by mouth daily.     [name] ELIQUIS 5 MG TABS tablet TAKE 1 TABLET (5 MG TOTAL) BY MOUTH 2 (TWO) TIMES DAILY. 60 tablet 3  . furosemide (LASIX) 40 MG tablet Take 1 tablet (40 mg total) by mouth daily. 90 tablet 3  . imatinib (GLEEVEC) 100 MG tablet Take 300 mg by mouth daily at 12 noon. Take with meals and large glass of water.Caution:Chemotherapy     . isosorbide mononitrate (IMDUR) 30 MG 24 hr tablet Take 1 tablet (30 mg total) by mouth daily. 90 tablet 3  . lidocaine (LIDODERM) 5 % Place 1 patch onto the skin daily as needed (for back pain). Remove & Discard patch within 12 hours or as directed by MD     . lisinopril (PRINIVIL,ZESTRIL) 10 MG tablet Take 1 tablet (10 mg total) by mouth daily. 30 tablet 11  . loperamide (IMODIUM) 2 MG capsule  Take 2-4 mg by mouth 3 (three) times daily as needed for diarrhea or loose stools.     . Multiple Vitamin (MULTIVITAMIN) tablet Take 1 tablet by mouth daily.    [name] oxyCODONE (OXY IR/ROXICODONE) 5 MG immediate release tablet Take 1 tablet (5 mg total) by mouth every 6 (six) hours as needed for moderate pain or severe pain. 20 tablet 0  . potassium chloride (K-DUR) 10 MEQ tablet Take 1 tablet (10 mEq total) by mouth daily. 90 tablet 3  . predniSONE (DELTASONE) 1 MG tablet Take 3 tablets (3 mg total) by mouth daily with breakfast. 270 tablet 1  . pyridostigmine (MESTINON) 60 MG  tablet Take 60 mg by mouth daily.    . rosuvastatin (CRESTOR) 10 MG tablet Take 5 mg by mouth daily.    [name] oxyCODONE-acetaminophen (PERCOCET/ROXICET) 5-325 MG tablet Take 1 tablet by mouth every 6 (six) hours as needed.  0   No current facility-administered medications for this visit.     Allergies:   Phenergan [promethazine hcl]; Zofran [ondansetron hcl]; and Sulfamethoxazole    Social History:  The patient  reports that he has never smoked. He has never used smokeless tobacco. He reports that he does not drink alcohol or use drugs.   Family History:  The patient's family history includes Breast cancer in his sister; Clotting disorder in his sister; Dementia in his sister; Heart attack in his father, mother, sister, sister, and sister; Heart disease in his father, mother, and sister; Stroke in his father and mother.    ROS: All other systems are reviewed and negative. Unless otherwise mentioned in H&P    PHYSICAL EXAM: VS:  BP (!) 144/58   Pulse 64   Ht _0  (1.702 m)   Wt 170 lb 9.6 oz (77.4 kg)   BMI 26.72 kg/m  , BMI Body mass index is 26.72 kg/m. GEN: Well nourished, well developed, in no acute distress HEENT: normal Neck: no JVD, carotid bruits, or masses Cardiac: RRR; 1/6 systolic murmurs, rubs, or gallops, unable to palpate pulse in the right lower extremity. I have used doppler and could not auscultate  with this. Foot is cold with bluish discoloration of the second toe on the right. Respiratory:  Clear to auscultation bilaterally, normal work of breathing GI: soft, nontender, nondistended, + BS MS: no deformity or atrophy Skin: warm and dry, no rash Neuro:  Strength and sensation are intact Psych: euthymic mood, full affect   EKG: Not completed this office visit.   Recent Labs: 09/27/2017: TSH 0.51 10/06/2017: Pro B Natriuretic peptide (BNP) 73.0 11/06/2017: Magnesium 2.0 12/15/2017: ALT 22; BUN 14; Creatinine, Ser 1.44; Hemoglobin 10.1; Platelets 216; Potassium 4.6; Sodium 145    Lipid Panel    Component Value Date/Time   CHOL 134 07/20/2017 0525   TRIG 166 (H) 07/20/2017 0525   HDL 33 (L) 07/20/2017 0525   CHOLHDL 4.1 07/20/2017 0525   VLDL 33 07/20/2017 0525   LDLCALC 68 07/20/2017 0525   LDLDIRECT 59.8 10/12/2012 0737      Wt Readings from Last 3 Encounters:  12/23/17 170 lb 9.6 oz (77.4 kg)  12/16/17 171 lb (77.6 kg)  12/15/17 176 lb (79.8 kg)      Other studies Reviewed: Echo 07/20/2017 Mild concentric LVH, EF 50-55, normal wall motion, grade 1 diastolic dysfunction, mild aortic stenosis (mean 7 mmHg), trivial AI, mild MR, normal RVSF  Carotid US 02/26/2017 Final Interpretation: Right Carotid: Velocities in the right ICA are consistent with a 40-59% stenosis. Left Carotid: Velocities in the left ICA are consistent with a 1-39% stenosis. Vertebrals: Right vertebral artery was patent with antegrade flow. No evidence       of flow detected in the left vertebral artery. Subclavians: Normal flow hemodynamics were seen in bilateral subclavian       arteries.  Nuclear stress test 09/27/2015 Low risk stress nuclear study with normal perfusion and normal left ventricular regional and global systolic function.  Cardiac catheterization 03/17/2001 ANGIOGRAPHIC FINDINGS: 1. The left main coronary artery has approximately 10-20% stenosis, but no  significant flow-limiting lesions. 2. The left anterior descending is a medium caliber vessel that provides three diagonal branches. There  is scattered 20% stenosis in the proximal to mid vessel with 30% more distal stenosis followed by an area of 50-60% stenosis towards the apex. Flow is TIMI-3 throughout this vessel. 3. The circumflex coronary artery essentially provides one large obtuse marginal branch then has a 20% stenosis. 4. The right coronary artery is a dominant vessel that essentially has two branches in the posterior descending distribution. There was a 30% distal RCA stenosis and a focal 95% stenosis in the smaller of the two branches in the posterior descending territory.  LEFT VENTRICULOGRAM: The left ventriculography reveals an ejection fraction estimated at 60-65% with no focal wall motion abnormalities and no mitral regurgitation.  DIAGNOSES: 1. Distal and branch vessel coronary artery disease as outlined. 2. Normal left ventricular contraction estimated at 60-65% with no significant mitral regurgitation.   ASSESSMENT AND PLAN:  1.  PAD: Likely significant in the right leg. Unable to palpate or doppler pulses in the right LEE, no DP or PD. Cold to touch with bluish tint to the second toe. He is due to have ABI with LEA in an hour. Will need to send report to Dr. Fletcher Anon. May need angiogram or hospitalization if necessary. Defer to Dr. Fletcher Anon for recommendations.   2. CAD: Hx of CABG. He denies chest pain, DOE or fatigue Continue secondary prevention with ACE, nitrates, statin.   3. Chronic LEE: Lasix has been increased to 40 mg daily. Creatinine 1.44 o 12/15/2017. I will not go up on diuretic in case he is to have angiography emergently. Albumin is normal at 4.1   4. Hx of DVT and CVA: Continue anticoagulation therapy with Eliquis.    Current medicines are reviewed at length with the patient today.    Labs/ tests ordered today include: None     Phill Myron. West Pugh, ANP, AACC   12/23/2017 12:42 PM    [address] Office [phone] Fax [phone]

## 2017-12-23 NOTE — Progress Notes (Signed)
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 Pharmacy consulted to dose heparin for ischemic limb. Last apixaban dose 11/14 at 0800 per med rec. Hg 10.4, plt wnl on admit. No bleed issues documented. Will start heparin now as patient possibly has new clot/ischemic limb, no bolus with recent apixaban.  Goal of Therapy:  Heparin level 0.3-0.7 units/ml aPTT 66-102 seconds Monitor platelets by anticoagulation protocol: Yes   Plan:  Baseline aPTT/heparin level No bolus. Start heparin at 1300 units/h 8h aPTT Daily heparin level/aPTT/CBC Monitor s/sx bleeding  Elicia Lamp, PharmD, BCPS Clinical Pharmacist 12/23/2017 2:57 PM

## 2017-12-23 NOTE — Telephone Encounter (Signed)
I reviewed the office note done by Bunnie Domino and discussed with the vascular tech.  It appears that the patient is presenting with acute limb ischemia affecting the right lower extremity. I recommended sending the patient to the emergency room at [facility] for evaluation. The patient might require CTA or emergent angiogram based on his creatinine level. I discussed the case with Dr. Carlis Abbott from vascular surgery so that they can see the patient when he arrives.

## 2017-12-23 NOTE — Anesthesia Preprocedure Evaluation (Signed)
Anesthesia Evaluation  Patient identified by MRN, date of birth, ID band Patient awake    Reviewed: Allergy & Precautions, NPO status , Patient's Chart, lab work & pertinent test results  History of Anesthesia Complications (+) PONV  Airway Mallampati: I  TM Distance: >3 FB Neck ROM: Full    Dental   Pulmonary    Pulmonary exam normal        Cardiovascular hypertension, Pt. on medications + CAD  Normal cardiovascular exam     Neuro/Psych    GI/Hepatic GERD  Medicated and Controlled,  Endo/Other    Renal/GU      Musculoskeletal   Abdominal   Peds  Hematology   Anesthesia Other Findings   Reproductive/Obstetrics                             Anesthesia Physical Anesthesia Plan  ASA: III  Anesthesia Plan: General   Post-op Pain Management:    Induction:   PONV Risk Score and Plan: Ondansetron, Midazolam and Treatment may vary due to age or medical condition  Airway Management Planned: Oral ETT  Additional Equipment:   Intra-op Plan:   Post-operative Plan: Extubation in OR  Informed Consent: I have reviewed the patients History and Physical, chart, labs and discussed the procedure including the risks, benefits and alternatives for the proposed anesthesia with the patient or authorized representative who has indicated his/her understanding and acceptance.     Plan Discussed with: CRNA and Surgeon  Anesthesia Plan Comments:         Anesthesia Quick Evaluation

## 2017-12-23 NOTE — Consult Note (Signed)
Hospital Consult    Reason for Consult:  Acute limb ischemia RLE Referring Physician:  Cardiology, Dr. Fletcher Anon MRN #:  [id number]  History of Present Illness: This is a 77 y.o. male with hx CKD, CVA on Eliquis (last had TIA in 07/2017 from occluded left vertebral, no bleed), HTN, hx DVT/PE, DM, PVD (s/p L SFA stent) recently admitted left shoulder infection (had PICC line removed last week) that presents to the ED from his cardiology office with concern for acute limb ischemia of right lower extremity.  Patient endorses 1 week of pain and numbness in his right foot.  He presented to his cardiology office today and was seen by one of the nurse practitioners and they could not calculate an ABI in addition on a duplex they felt that he had a distal right SFA popliteal occlusion.  Patient states he is ambulatory.  He last took his dose of Eliquis this morning.  He denies any previous right lower extremity revascularizations.  He previously underwent a right lower extremity arteriogram in October 2018 by Dr. Fletcher Anon that showed some stenosis in the common femoral artery with some heavily calcified distal SFA and popliteal disease in the 60 to 70% range with three-vessel runoff.  Per Dr. Fletcher Anon the common femoral was open on today's duplex with only slightly increased velocity of 200 with distal SFA/pop occlusion.  Past Medical History:  Diagnosis Date  . Bruises easily    d/t being on Eliquis  . Carotid artery disease ([place])    a. Duplex 05/2014: 84-66% RICA, 5-99% LICA, elevated velocities in right subclavian artery, normal left subclavian artery..  . Chronic back pain    HNP  . Chronic kidney disease (CKD)   . Claustrophobia    takes Ativan as needed  . 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 Coronary atherosclerosis of unspecified type of vessel, native or graft    a. cath in 2003 showed 10-20% LM, scattered 20% prox-mid LAD, 30% more distal LAD, 50-60% stenosis of LAD towards  apex, 20% Cx, 30% dRCA, focal 95% stenosis in smaller of 2 branches of PDA, EF 60-65%.  . Diabetes mellitus ([place])   . Diarrhea    takes Imodium daily as needed  . Diverticulosis   . Diverticulosis of colon (without mention of hemorrhage)   . DJD (degenerative joint disease)   . DVT (deep venous thrombosis) ([place]) 07/2011  . Esophageal stricture   . Essential hypertension    takes Imdur and Lisinopril daily  . Gastric polyps    benign  . GERD (gastroesophageal reflux disease)    takes Nexium daily  . Glaucoma    uses eye drops  . History of bronchitis    not sure when the last time  . History of colon polyps    benign  . History of kidney stones   . History of kidney stones   . Insomnia    takes Melatonin nightly as needed  . Mixed hyperlipidemia    takes Crestor daily  . Myasthenia gravis ([place])    uses prednisone  . Osteoporosis 2016  . Peripheral edema    takes Lasix daily  . Pituitary tumor    checks Dr[facility]Walden at [facility] checks it every Jan   . Pneumonia 01/2016  . PONV (postoperative nausea and vomiting)   . Ptosis   . Pulmonary embolus ([place]) 2012  . Stroke [facility]) 06/2015    Past Surgical History:  Procedure Laterality Date  . ABDOMINAL  AORTOGRAM W/LOWER EXTREMITY N/A 11/18/2016   Procedure: ABDOMINAL AORTOGRAM W/LOWER EXTREMITY;  Surgeon: [name], MD;  Location: [facility];  Service: Cardiovascular;  Laterality: N/A;  . BACK SURGERY     x2, lumbar and cervical  . CARDIAC CATHETERIZATION    . CATARACT EXTRACTION Bilateral 12/12  . COLONOSCOPY    . ESOPHAGOGASTRODUODENOSCOPY ENDOSCOPY  04/2015  . IR GENERIC HISTORICAL  03/03/2016   IR IVC FILTER PLMT / S&I [name] GUID/MOD SED 03/03/2016 [name], MD [facility]  . IR GENERIC HISTORICAL  04/09/2016   IR RADIOLOGIST EVAL & MGMT 04/09/2016 [name], MD [facility]  . IR GENERIC HISTORICAL  04/30/2016   IR IVC FILTER RETRIEVAL / S&I [name] GUID/MOD SED 04/30/2016 [name], MD [facility]  [facility]  . LITHOTRIPSY    . LUMBAR LAMINECTOMY/DECOMPRESSION MICRODISCECTOMY Right 09/14/2012   Procedure: Right Lumbar three-four, four-five, Lumbar five-Sacral one decompressive laminectomy;  Surgeon: [name], MD;  Location: [facility];  Service: Neurosurgery;  Laterality: Right;  . LUMBAR LAMINECTOMY/DECOMPRESSION MICRODISCECTOMY Left 03/11/2016   Procedure: Left Lumbar Two-ThreeMicrodiscectomy;  Surgeon: [name], MD;  Location: [facility];  Service: Neurosurgery;  Laterality: Left;  . PERIPHERAL VASCULAR INTERVENTION  11/18/2016   Procedure: PERIPHERAL VASCULAR INTERVENTION;  Surgeon: [name], MD;  Location: [facility];  Service: Cardiovascular;;  Left popliteal  . SHOULDER SURGERY Left 05/07/2009  . TONSILLECTOMY      Allergies  Allergen Reactions  . Phenergan [Promethazine Hcl] Swelling and Other (See Comments)    Cannot be combined with Zofran because swelling of the eyes resulted and patient became very restless-  . Zofran [Ondansetron Hcl] Swelling and Other (See Comments)    Cannot be combined with Phenergan because swelling of the eyes resulted and patient became very restless-  . Other Other (See Comments)    Regarding scans, the PATIENT IS CLAUSTROPHOBIC!!!  . Sulfamethoxazole Rash and Itching    Prior to Admission medications   Medication Sig Start Date End Date Taking? Authorizing Provider  brimonidine-timolol (COMBIGAN) 0.2-0.5 % ophthalmic solution Place 1 drop into both eyes 2 (two) times daily.    Yes [provider]  cholecalciferol (VITAMIN D) 1000 units tablet Take 1,000 Units by mouth daily.    Yes [provider]  ELIQUIS 5 MG TABS tablet TAKE 1 TABLET (5 MG TOTAL) BY MOUTH 2 (TWO) TIMES DAILY. 12/09/15  Yes Kathrynn Ducking, MD  furosemide (LASIX) 40 MG tablet Take 1 tablet (40 mg total) by mouth daily. 11/26/17 11/21/18 Yes Sherren Mocha, MD  imatinib (GLEEVEC) 100 MG tablet Take 300 mg by mouth daily with lunch. Take  with noon meal and a large glass of water.Caution:Chemotherapy   Yes [provider]  isosorbide mononitrate (IMDUR) 30 MG 24 hr tablet Take 1 tablet (30 mg total) by mouth daily. 09/17/15  Yes Sherren Mocha, MD  lidocaine (LIDODERM) 5 % Place 1 patch onto the skin daily as needed (for back pain). Remove & Discard patch within 12 hours or as directed by MD    Yes [provider]  lisinopril (PRINIVIL,ZESTRIL) 10 MG tablet Take 1 tablet (10 mg total) by mouth daily. 09/27/17  Yes Renato Shin, MD  loperamide (IMODIUM) 2 MG capsule Take 2-4 mg by mouth 3 (three) times daily as needed for diarrhea or loose stools.    Yes [provider]  Multiple Vitamin (MULTIVITAMIN) tablet Take 1 tablet by mouth daily.   Yes [provider]  oxyCODONE (OXY IR/ROXICODONE) 5 MG immediate release tablet Take 1 tablet (5 mg total) by mouth every 6 (six) hours as needed for moderate pain or severe pain. 12/03/17  Yes Renato Shin, MD  potassium chloride (K-DUR) 10 MEQ tablet Take 1 tablet (10 mEq total) by mouth daily. 11/26/17 11/21/18 Yes Sherren Mocha, MD  predniSONE (DELTASONE) 1 MG tablet Take 3 tablets (3 mg total) by mouth daily with breakfast. 06/23/17  Yes Kathrynn Ducking, MD  pyridostigmine (MESTINON) 60 MG tablet Take 60 mg by mouth daily.   Yes [provider]  rosuvastatin (CRESTOR) 10 MG tablet Take 5 mg by mouth daily.   Yes [provider]  blood glucose meter kit and supplies KIT Dispense based on patient and insurance preference. Use up to four times daily as directed. (FOR ICD-9 250.00, 250.01). 11/06/17   Bonnielee Haff, MD  oxyCODONE-acetaminophen (PERCOCET/ROXICET) 5-325 MG tablet Take 1 tablet by mouth every 6 (six) hours as needed. 12/13/17   [provider]    Social History   Socioeconomic History  . Marital status: Married    Spouse name: Hassan Rowan  . Number of children: 2  . Years of education: 1-College  . Highest education  level: Not on file  Occupational History  . 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 Financial resource strain: Not on file  . Food insecurity:    Worry: Not on file    Inability: Not on file  . Transportation needs:    Medical: Not on file    Non-medical: Not on file  Tobacco Use  . Smoking status: Never Smoker  . Smokeless tobacco: Never Used  Substance and Sexual Activity  . Alcohol use: No    Alcohol/week: 0.0 standard drinks  . Drug use: No  . Sexual activity: Not Currently  Lifestyle  . Physical activity:    Days per week: Not on file    Minutes per session: Not on file  . Stress: Not on file  Relationships  . Social connections:    Talks on phone: Patient refused    Gets together: Patient refused    Attends religious service: Patient refused    Active member of club or organization: Patient refused    Attends meetings of clubs or organizations: Patient refused    Relationship status: Patient refused  . Intimate partner violence:    Fear of current or ex partner: Patient refused    Emotionally abused: Patient refused    Physically abused: Patient refused    Forced sexual activity: Patient refused  Other Topics Concern  . Not on file  Social History Narrative   Lives at home w/ his wife   Patient is right handed.   Patient has 1-2 cups of caffeine daily.     Family History  Problem Relation Age of Onset  . Heart disease Mother   . Heart attack Mother   . Stroke Mother   . Heart disease Father   . Heart attack Father   . Stroke Father   . Breast cancer Sister        Twin   . Heart attack Sister   . Dementia Sister   . Heart disease Sister   . Heart attack Sister   . Clotting disorder Sister   . Heart attack Sister   . Colon cancer Neg Hx     ROS: _0  Positive   _1  Negative   _2  All  sytems reviewed and are negative  Cardiovascular: _0  chest pain/pressure _1  palpitations _2  SOB lying flat _3  DOE _4  pain  in legs while walking _5  pain in legs at rest _6  pain in legs at night _7  non-healing ulcers _8  hx of DVT _9  swelling in legs  Pulmonary: _10  productive cough _11  asthma/wheezing _12  home O2  Neurologic: _13  weakness in _14  arms _15  legs _16  numbness in _17  arms _18  legs (right leg) _19  hx of CVA _20  mini stroke _21 difficulty speaking or slurred speech _22  temporary loss of vision in one eye _23  dizziness  Hematologic: _24  hx of cancer _25  bleeding problems _26  problems with blood clotting easily  Endocrine:   _27  diabetes _28  thyroid disease  GI _29  vomiting blood _30  blood in stool  GU: _31  CKD/renal failure _32  HD--_33  M/W/F or _34  T/T/S _35  burning with urination _36  blood in urine  Psychiatric: _37  anxiety _38  depression  Musculoskeletal: _39  arthritis _40  joint pain  Integumentary: _41  rashes _42  ulcers  Constitutional: _43  fever _44  chills   Physical Examination  Vitals:   12/23/17 1421  Pulse: (!) 103  Resp: 16  Temp: 98.7 F (37.1 C)  SpO2: 100%   Body mass index is 26.78 kg/m.  General:  WDWN in NAD Gait: Not observed HENT: WNL, normocephalic Pulmonary: normal non-labored breathing, without Rales, rhonchi,  wheezing Cardiac: regular, without  Murmurs, rubs or gallops Abdomen: soft, NT/ND, no masses Skin: Cool right foot Vascular Exam/Pulses:  Right Left  Radial 2+ (normal) 2+ (normal)  Ulnar    Femoral 1+ (weak) 2+ (normal)  Popliteal Biphasic signal   DP No signal   PT No signal    Extremities: Right foot cool to touch, able to wiggle toes, numb to mid foot Musculoskeletal: no muscle wasting or atrophy  Neurologic: A&O X 3   CBC    Component Value Date/Time   WBC 9.1 12/23/2017 1435   RBC 3.18 (L) 12/23/2017 1435   HGB 10.4 (L) 12/23/2017 1435   HGB 10.1 (L) 12/15/2017 1242   HGB 12.4 (L) 11/21/2012 1008   HCT 33.2 (L) 12/23/2017 1435   HCT 30.8 (L) 12/15/2017 1242   HCT 35.8 (L) 11/21/2012 1008   PLT 227 12/23/2017 1435   PLT 216  12/15/2017 1242   MCV 104.4 (H) 12/23/2017 1435   MCV 100 (H) 12/15/2017 1242   MCV 90.7 11/21/2012 1008   MCH 32.7 12/23/2017 1435   MCHC 31.3 12/23/2017 1435   RDW 14.6 12/23/2017 1435   RDW 14.3 12/15/2017 1242   RDW 13.7 11/21/2012 1008   LYMPHSABS 1.7 12/23/2017 1435   LYMPHSABS 1.1 11/21/2012 1008   MONOABS 0.9 12/23/2017 1435   MONOABS 0.6 11/21/2012 1008   EOSABS 0.0 12/23/2017 1435   EOSABS 0.1 11/21/2012 1008   BASOSABS 0.0 12/23/2017 1435   BASOSABS 0.0 11/21/2012 1008    BMET    Component Value Date/Time   NA 145 (H) 12/15/2017 1242   NA 141 02/26/2012 0852   K 4.6 12/15/2017 1242   K 3.4 (L) 02/26/2012 0852   CL 103 12/15/2017 1242   CL 105 02/26/2012 0852   CO2 24 12/15/2017 1242   CO2 28 02/26/2012 0852   GLUCOSE 109 (H) 12/15/2017 1242   GLUCOSE 76 11/06/2017 0442   GLUCOSE 77 02/26/2012 0852   BUN 14 12/15/2017 1242   BUN 13.0 02/26/2012 0852   CREATININE 1.44 (H) 12/15/2017 1242   CREATININE 1.33 05/11/2013 1246   CREATININE 1.3 02/26/2012 0852   CALCIUM 9.7 12/15/2017  1242   CALCIUM 9.1 02/26/2012 0852   GFRNONAA 46 (L) 12/15/2017 1242   GFRAA 53 (L) 12/15/2017 1242    COAGS: Lab Results  Component Value Date   INR 1.42 12/23/2017   INR 1.25 07/19/2017   INR 1.1 11/10/2016     ASSESSMENT/PLAN: This is a 77 y.o. male with multiple medical comorbidities that presents with concern for acute on chronic limb ischemia of his right lower extremity of one weeks duration.  Patient has no signals on exam but is able to still move his foot/toes and will classify him as a [place] IIa.  Discussed with him and his wife that this is a limb threatening situation.  In addition he is at high risk for bleeding complications given he is on Eliquis and last took his dose this morning.  We will plan for aortogram and right lower extremity arteriogram to see if we can have an endovascular option for his likely right distal SFA/popliteal occlusion noted on duplex.   Do not think this is embolic since he has not missed ELiquis dosing.  Had hx of right distal SFA/pop 60-70% stenosis on arteriogram last year and suspect acute on chronic process here.  Discussed that if this fails he may need a cutdown with thrombectomy and/or bypass and/or lysis.   [name], MD [facility] Field Office: [phone] Pager: [place]

## 2017-12-23 NOTE — ED Provider Notes (Signed)
[facility] EMERGENCY DEPARTMENT Provider Note   CSN: [id number] Arrival date & time: 12/23/17  1410     History   Chief Complaint Chief Complaint  Patient presents with  . possible clot  . right foot swelling    HPI [name] is a 77 y.o. male.  HPI Patient was sent from Dr. Tyrell Antonio office for emergent evaluation of right lower extremity arterial occlusion.  Patient reports he has been having several weeks of pain and numbness in his right foot.  He reports it has gotten acutely worse over the past couple of days.  ABI done prior to arrival as outpatient.  Patient reports he already has arterial stents in both legs.  He takes Eliquis regularly and took his a.m. dose. Past Medical History:  Diagnosis Date  . Bruises easily    d/t being on Eliquis  . Carotid artery disease ([place])    a. Duplex 05/2014: 10-30% RICA, 1-31% LICA, elevated velocities in right subclavian artery, normal left subclavian artery..  . Chronic back pain    HNP  . Chronic kidney disease (CKD)   . Claustrophobia    takes Ativan as needed  . 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 Coronary atherosclerosis of unspecified type of vessel, native or graft    a. cath in 2003 showed 10-20% LM, scattered 20% prox-mid LAD, 30% more distal LAD, 50-60% stenosis of LAD towards apex, 20% Cx, 30% dRCA, focal 95% stenosis in smaller of 2 branches of PDA, EF 60-65%.  . Diabetes mellitus ([place])   . Diarrhea    takes Imodium daily as needed  . Diverticulosis   . Diverticulosis of colon (without mention of hemorrhage)   . DJD (degenerative joint disease)   . DVT (deep venous thrombosis) ([place]) 07/2011  . Esophageal stricture   . Essential hypertension    takes Imdur and Lisinopril daily  . Gastric polyps    benign  . GERD (gastroesophageal reflux disease)    takes Nexium daily  . Glaucoma    uses eye drops  . History of bronchitis    not sure when the last time  .  History of colon polyps    benign  . History of kidney stones   . History of kidney stones   . Insomnia    takes Melatonin nightly as needed  . Mixed hyperlipidemia    takes Crestor daily  . Myasthenia gravis ([place])    uses prednisone  . Osteoporosis 2016  . Peripheral edema    takes Lasix daily  . Pituitary tumor    [facility] Dr[facility]Walden at [facility] checks it every Jan   . Pneumonia 01/2016  . PONV (postoperative nausea and vomiting)   . Ptosis   . Pulmonary embolus ([place]) 2012  . Stroke [facility]) 06/2015    Patient Active Problem List   Diagnosis Date Noted  . Septic arthritis of left sternoclavicular joint ([place]) 11/03/2017  . Cervical spinal cord compression ([place]) 10/30/2017  . Nasal abscess 09/25/2017  . Immunocompromised ([place]) 09/25/2017  . Cellulitis of right axilla 09/25/2017  . Anemia 08/05/2017  . Transient ischemic attack (TIA) 07/23/2017  . Subcortical infarction ([place])   . Right hemiparesis ([place])   . Ataxia 07/21/2017  . TIA (transient ischemic attack)   . Thrombocytopenia ([place])   . Wheezing 06/17/2017  . Bursitis of left elbow 05/01/2017  . Dystonia 12/29/2016  . PAD (peripheral artery disease) ([place]) 11/18/2016  . Osteoporosis 08/28/2016  .  Paronychia of great toe, left 08/10/2016  . Carotid arterial disease ([place]) 07/09/2016  . Hypocalcemia 07/09/2016  . Diplopia 07/02/2016  . S/P lumbar laminectomy 03/11/2016  . Radicular pain of left lower extremity 02/21/2016  . Cough 01/10/2016  . 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 Myasthenia gravis ([place])   . Diabetes mellitus type 2 in nonobese (HCC)   . Coronary artery disease involving native coronary artery of native heart without angina pectoris   . Ataxia, post-stroke   . Gait disturbance, post-stroke   . Proximal leg weakness   . Cervical spondylosis without myelopathy   . Achilles tendinitis of right lower extremity   . Chronic diastolic CHF (congestive heart failure) ([place])   .  Gastroesophageal reflux disease without esophagitis   . Chronic kidney disease   . 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 Cerebellar stroke ([place])   . Ischemic stroke ([place])   . Cerebrovascular accident (CVA) due to thrombosis of left vertebral artery ([place])   . History of pulmonary embolus (PE)   . Chronic anticoagulation   . HLD (hyperlipidemia)   . Neck pain 03/19/2015  . Pain in the chest   . 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 Chronic diastolic heart failure, NYHA class 1 ([place]) 03/07/2015  . Diabetic gastroparesis ([place]) 03/07/2015  . Screening for osteoporosis 07/19/2014  . Dyspnea 04/09/2014  . Edema 04/09/2014  . Routine general medical examination at a health care facility 10/23/2013  . Bilateral carotid bruits 05/24/2013  . Abnormal CT scan, lung 05/24/2013  . Encounter for therapeutic drug monitoring 03/14/2013  . Lymphadenopathy, inguinal 01/11/2013  . AKI (acute kidney injury) ([place]) 10/28/2012  . 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 Myasthenia gravis with exacerbation ([place]) 03/31/2012  . Disease of salivary gland 12/09/2011  . Left facial pain 10/28/2011  . Pulmonary embolism ([place]) 10/25/2011  . 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 GERD with stricture 09/23/2011  . Long term (current) use of anticoagulants 08/03/2011  . History of pulmonary embolism 07/30/2011  . History of DVT (deep vein thrombosis) 07/30/2011  . Subconjunctival hemorrhage 06/22/2011  . Bilateral conjunctivitis 04/09/2011  . Diabetes mellitus type 2, controlled ([place]) 12/18/2010  . Hearing loss 08/04/2010  . Ptosis of eyelid 12/30/2009  . Essential hypertension 04/24/2009  . HYPERLIPIDEMIA, MIXED 10/05/2008  . HEMORRHOIDS-INTERNAL 07/02/2008  . HEMORRHOIDS-EXTERNAL 07/02/2008  . DIVERTICULOSIS-COLON 07/02/2008  . PERSONAL HX COLONIC POLYPS 07/02/2008  . RASH-NONVESICULAR 02/07/2008  . PNEUMONIA 12/08/2007  . Chronic  myeloid leukemia in remission ([place]) 07/07/2007  . CAD (coronary artery disease) 07/07/2007  . NEPHROLITHIASIS, HX OF 07/07/2007    Past Surgical History:  Procedure Laterality Date  . ABDOMINAL AORTOGRAM W/LOWER EXTREMITY N/A 11/18/2016   Procedure: ABDOMINAL AORTOGRAM W/LOWER EXTREMITY;  Surgeon: [name], MD;  Location: [facility];  Service: Cardiovascular;  Laterality: N/A;  . BACK SURGERY     x2, lumbar and cervical  . CARDIAC CATHETERIZATION    . CATARACT EXTRACTION Bilateral 12/12  . COLONOSCOPY    . ESOPHAGOGASTRODUODENOSCOPY ENDOSCOPY  04/2015  . IR GENERIC HISTORICAL  03/03/2016   IR IVC FILTER PLMT / S&I [name] GUID/MOD SED 03/03/2016 [name], MD [facility]INTER[facility] RAD  . IR GENERIC HISTORICAL  04/09/2016   IR RADIOLOGIST EVAL & MGMT 04/09/2016 [name], MD [facility] INTER[facility] RAD  . IR GENERIC HISTORICAL  04/30/2016   IR IVC FILTER RETRIEVAL / S&I [name] GUID/MOD SED 04/30/2016  [name], MD [facility]-INTERV RAD  . LITHOTRIPSY    . LUMBAR LAMINECTOMY/DECOMPRESSION MICRODISCECTOMY Right 09/14/2012   Procedure: Right Lumbar three-four, four-five, Lumbar five-Sacral one decompressive laminectomy;  Surgeon: [name], MD;  Location: [facility];  Service: Neurosurgery;  Laterality: Right;  . LUMBAR LAMINECTOMY/DECOMPRESSION MICRODISCECTOMY Left 03/11/2016   Procedure: Left Lumbar Two-ThreeMicrodiscectomy;  Surgeon: [name], MD;  Location: [facility];  Service: Neurosurgery;  Laterality: Left;  . PERIPHERAL VASCULAR INTERVENTION  11/18/2016   Procedure: PERIPHERAL VASCULAR INTERVENTION;  Surgeon: [name], MD;  Location: [facility] CV LAB;  Service: Cardiovascular;;  Left popliteal  . SHOULDER SURGERY Left 05/07/2009  . TONSILLECTOMY          Home Medications    Prior to Admission medications   Medication Sig Start Date End Date Taking? Authorizing Provider  blood glucose meter kit and supplies KIT Dispense based on patient and insurance preference. Use up to  four times daily as directed. (FOR ICD-9 250.00, 250.01). 11/06/17   Bonnielee Haff, MD  brimonidine-timolol (COMBIGAN) 0.2-0.5 % ophthalmic solution Place 1 drop into both eyes every 12 (twelve) hours.    [provider]  cholecalciferol (VITAMIN D) 1000 units tablet Take 1,000 Units by mouth daily.     [provider]  ELIQUIS 5 MG TABS tablet TAKE 1 TABLET (5 MG TOTAL) BY MOUTH 2 (TWO) TIMES DAILY. 12/09/15   Kathrynn Ducking, MD  furosemide (LASIX) 40 MG tablet Take 1 tablet (40 mg total) by mouth daily. 11/26/17 11/21/18  Sherren Mocha, MD  imatinib (GLEEVEC) 100 MG tablet Take 300 mg by mouth daily at 12 noon. Take with meals and large glass of water.Caution:Chemotherapy     [provider]  isosorbide mononitrate (IMDUR) 30 MG 24 hr tablet Take 1 tablet (30 mg total) by mouth daily. 09/17/15   Sherren Mocha, MD  lidocaine (LIDODERM) 5 % Place 1 patch onto the skin daily as needed (for back pain). Remove & Discard patch within 12 hours or as directed by MD     [provider]  lisinopril (PRINIVIL,ZESTRIL) 10 MG tablet Take 1 tablet (10 mg total) by mouth daily. 09/27/17   Renato Shin, MD  loperamide (IMODIUM) 2 MG capsule Take 2-4 mg by mouth 3 (three) times daily as needed for diarrhea or loose stools.     [provider]  Multiple Vitamin (MULTIVITAMIN) tablet Take 1 tablet by mouth daily.    [provider]  oxyCODONE (OXY IR/ROXICODONE) 5 MG immediate release tablet Take 1 tablet (5 mg total) by mouth every 6 (six) hours as needed for moderate pain or severe pain. 12/03/17   Renato Shin, MD  oxyCODONE-acetaminophen (PERCOCET/ROXICET) 5-325 MG tablet Take 1 tablet by mouth every 6 (six) hours as needed. 12/13/17   [provider]  potassium chloride (K-DUR) 10 MEQ tablet Take 1 tablet (10 mEq total) by mouth daily. 11/26/17 11/21/18  Sherren Mocha, MD  predniSONE (DELTASONE) 1 MG tablet Take 3 tablets (3 mg total) by mouth  daily with breakfast. 06/23/17   Kathrynn Ducking, MD  pyridostigmine (MESTINON) 60 MG tablet Take 60 mg by mouth daily.    [provider]  rosuvastatin (CRESTOR) 10 MG tablet Take 5 mg by mouth daily.    [provider]    Family History Family History  Problem Relation Age of Onset  . Heart disease Mother   . Heart attack Mother   . Stroke Mother   . Heart disease Father   .  Heart attack Father   . Stroke Father   . Breast cancer Sister        Twin   . Heart attack Sister   . Dementia Sister   . Heart disease Sister   . Heart attack Sister   . Clotting disorder Sister   . Heart attack Sister   . Colon cancer Neg Hx     Social History Social History   Tobacco Use  . Smoking status: Never Smoker  . Smokeless tobacco: Never Used  Substance Use Topics  . Alcohol use: No    Alcohol/week: 0.0 standard drinks  . Drug use: No     Allergies   Phenergan [promethazine hcl]; Zofran [ondansetron hcl]; and Sulfamethoxazole   Review of Systems Review of Systems 10 Systems reviewed and are negative for acute change except as noted in the HPI.   Physical Exam Updated Vital Signs Pulse (!) 103   Temp 98.7 F (37.1 C) (Oral)   Resp 16   Ht _0  (1.702 m)   Wt 77.6 kg   SpO2 100%   BMI 26.78 kg/m   Physical Exam  Constitutional: He is oriented to person, place, and time.  Patient is alert and nontoxic.  Mental status is clear.  No respiratory distress.  HENT:  Head: Normocephalic and atraumatic.  Cardiovascular: Normal rate, regular rhythm and normal heart sounds.  Pulmonary/Chest: Effort normal and breath sounds normal.  Abdominal: Soft. He exhibits no distension. There is no tenderness.  Musculoskeletal:  Peripheral edema bilateral lower extremities 1+ to 2+.  See attached images.  No palpable pulse in the right lower extremity.  No pulse by Doppler in the right lower extremity.  Pulse by Doppler present on left lower extremity.  Neurological:  He is alert and oriented to person, place, and time. He exhibits normal muscle tone. Coordination normal.  Skin: Skin is warm and dry.  Psychiatric: He has a normal mood and affect.           ED Treatments / Results  Labs (all labs ordered are listed, but only abnormal results are displayed) Labs Reviewed  CBC WITH DIFFERENTIAL/PLATELET - Abnormal; Notable for the following components:      Result Value   RBC 3.18 (*)    Hemoglobin 10.4 (*)    HCT 33.2 (*)    MCV 104.4 (*)    All other components within normal limits  BASIC METABOLIC PANEL  PROTIME-INR    EKG None  Radiology No results found.  Procedures Procedures (including critical care time) CRITICAL CARE Performed by: Charlesetta Shanks   Total critical care time: 20 minutes  Critical care time was exclusive of separately billable procedures and treating other patients.  Critical care was necessary to treat or prevent imminent or life-threatening deterioration.  Critical care was time spent personally by me on the following activities: development of treatment plan with patient and/or surrogate as well as nursing, discussions with consultants, evaluation of patient's response to treatment, examination of patient, obtaining history from patient or surrogate, ordering and performing treatments and interventions, ordering and review of laboratory studies, ordering and review of radiographic studies, pulse oximetry and re-evaluation of patient's condition. Medications Ordered in ED Medications - No data to display   Initial Impression / Assessment and Plan / ED Course  I have reviewed the triage vital signs and the nursing notes.  Pertinent labs & imaging results that were available during my care of the patient were reviewed by me and considered in  my medical decision making (see chart for details).  Clinical Course as of Dec 24 1535  Thu Dec 23, 2017  1433 Consult to vascular surgery ordered.   [MP]  8412  Consult: I reviewed with Dr. Carlis Abbott.  Advises initiate heparin and will need to assess the patient to determine if immediate intervention.  Will determine at that time if reversal of Eliquis indicated.   [MP]  1531 Consult hospitalist ordered   [MP]    Clinical Course User Index [MP] Charlesetta Shanks, MD   Consult placed for vascular surgery just after patient arrival.  Patient has identified arterial occlusion of the right lower extremity.  He does take Eliquis.  I have reviewed this case with Dr. Carlis Abbott.  As per consultation, will initiate heparin and disposition per Dr. Ainsley Spinner assessment for diagnostic versus OR management.  Final Clinical Impressions(s) / ED Diagnoses   Final diagnoses:  Arterial occlusive disease  Lower limb ischemia    ED Discharge Orders    None       Charlesetta Shanks, MD 12/24/17 [phone]

## 2017-12-23 NOTE — Anesthesia Procedure Notes (Signed)
Procedure Name: Intubation Date/Time: 12/23/2017 4:50 PM Performed by: Neldon Newport, CRNA Pre-anesthesia Checklist: Timeout performed, Patient being monitored, Suction available, Emergency Drugs available and Patient identified Patient Re-evaluated:Patient Re-evaluated prior to induction Oxygen Delivery Method: Circle system utilized Preoxygenation: Pre-oxygenation with 100% oxygen Induction Type: IV induction Ventilation: Mask ventilation without difficulty Laryngoscope Size: Mac and 4 Grade View: Grade I Tube type: Oral Tube size: 7.0 mm Number of attempts: 1 Placement Confirmation: breath sounds checked- equal and bilateral,  positive ETCO2 and ETT inserted through vocal cords under direct vision Secured at: 22 cm Tube secured with: Tape Dental Injury: Teeth and Oropharynx as per pre-operative assessment

## 2017-12-23 NOTE — Patient Instructions (Signed)
Follow-Up: You will need a follow up appointment in [place].  Medication Instructions:  NO CHANGES- Your physician recommends that you continue on your current medications as directed. Please refer to the Current Medication list given to you today.  If you need a refill on your cardiac medications before your next appointment, please call your pharmacy.  Labwork: If you have labs (blood work) drawn today and your tests are completely normal, you will receive your results ONLY by: . MyChart Message (if you have MyChart) -OR- . A paper copy in the mail  At [facility], you and your health needs are our priority.  As part of our continuing mission to provide you with exceptional heart care, we have created designated Provider Care Teams.  These Care Teams include your primary Cardiologist (physician) and Advanced Practice Providers (APPs -  Physician Assistants and Nurse Practitioners) who all work together to provide you with the care you need, when you need it.  Thank you for choosing [facility] at [facility]!!

## 2017-12-23 NOTE — ED Triage Notes (Signed)
Pt sent here from [facility] for possible clot in the right foot. Noticeable swelling to the lower extremity. Pt complains of numbness to the right foot.

## 2017-12-24 LAB — POCT I-STAT 7, (LYTES, BLD GAS, ICA,H+H)
Acid-Base Excess: 3 mmol/L — ABNORMAL HIGH (ref 0.0–2.0)
Bicarbonate: 25.7 mmol/L (ref 20.0–28.0)
Calcium, Ion: 1.18 mmol/L (ref 1.15–1.40)
HEMATOCRIT: 22 % — AB (ref 39.0–52.0)
HEMOGLOBIN: 7.5 g/dL — AB (ref 13.0–17.0)
O2 Saturation: 100 %
PCO2 ART: 31.5 mmHg — AB (ref 32.0–48.0)
POTASSIUM: 3.7 mmol/L (ref 3.5–5.1)
Patient temperature: 36.1
Sodium: 140 mmol/L (ref 135–145)
TCO2: 27 mmol/L (ref 22–32)
pH, Arterial: 7.517 — ABNORMAL HIGH (ref 7.350–7.450)
pO2, Arterial: 145 mmHg — ABNORMAL HIGH (ref 83.0–108.0)

## 2017-12-24 LAB — POCT ACTIVATED CLOTTING TIME
ACTIVATED CLOTTING TIME: 202 s
Activated Clotting Time: 252 seconds
Activated Clotting Time: 252 seconds

## 2017-12-24 LAB — APTT: aPTT: >200 seconds (ref 24–36)

## 2017-12-24 LAB — HEPARIN LEVEL (UNFRACTIONATED): Heparin Unfractionated: >2.2 IU/mL — ABNORMAL HIGH (ref 0.30–0.70)

## 2017-12-24 LAB — GLUCOSE, CAPILLARY: Glucose-Capillary: 129 mg/dL — ABNORMAL HIGH (ref 70–99)

## 2017-12-24 LAB — ABO/RH: ABO/RH(D): A POS

## 2017-12-24 MED ORDER — FLEET ENEMA 7-19 GM/118ML RE ENEM: 1.0000 | ENEMA | Freq: Once | RECTAL | Status: DC | PRN

## 2017-12-24 MED ORDER — ACETAMINOPHEN 325 MG PO TABS
325.0000 mg | ORAL_TABLET | ORAL | Status: DC | PRN
  Filled 2017-12-24: qty 2

## 2017-12-24 MED ORDER — PREDNISONE 1 MG PO TABS
3.0000 mg | ORAL_TABLET | Freq: Every day | ORAL | Status: DC
  Administered 2017-12-24 – 2017-12-28 (×4): 3 mg via ORAL
  Filled 2017-12-24 (×6): qty 3

## 2017-12-24 MED ORDER — BRIMONIDINE TARTRATE-TIMOLOL 0.2-0.5 % OP SOLN: 1.0000 [drp] | Freq: Two times a day (BID) | OPHTHALMIC | Status: DC

## 2017-12-24 MED ORDER — OXYCODONE HCL 5 MG PO TABS
5.0000 mg | ORAL_TABLET | ORAL | Status: DC | PRN
  Administered 2017-12-24 – 2017-12-28 (×8): 10 mg via ORAL
  Filled 2017-12-24 (×8): qty 2

## 2017-12-24 MED ORDER — ALUM & MAG HYDROXIDE-SIMETH 200-200-20 MG/5ML PO SUSP: 15.0000 mL | ORAL | Status: DC | PRN

## 2017-12-24 MED ORDER — ONDANSETRON HCL 4 MG/2ML IJ SOLN
4.0000 mg | Freq: Once | INTRAMUSCULAR | Status: AC
  Administered 2017-12-24: 4 mg via INTRAVENOUS

## 2017-12-24 MED ORDER — PANTOPRAZOLE SODIUM 40 MG PO TBEC
40.0000 mg | DELAYED_RELEASE_TABLET | Freq: Every day | ORAL | Status: DC
  Administered 2017-12-24 – 2017-12-28 (×5): 40 mg via ORAL
  Filled 2017-12-24 (×5): qty 1

## 2017-12-24 MED ORDER — SENNOSIDES-DOCUSATE SODIUM 8.6-50 MG PO TABS: 1.0000 | ORAL_TABLET | Freq: Every evening | ORAL | Status: DC | PRN

## 2017-12-24 MED ORDER — GUAIFENESIN-DM 100-10 MG/5ML PO SYRP: 15.0000 mL | ORAL_SOLUTION | ORAL | Status: DC | PRN

## 2017-12-24 MED ORDER — FENTANYL CITRATE (PF) 100 MCG/2ML IJ SOLN: 25.0000 ug | INTRAMUSCULAR | Status: DC | PRN

## 2017-12-24 MED ORDER — PYRIDOSTIGMINE BROMIDE 60 MG PO TABS
60.0000 mg | ORAL_TABLET | Freq: Every day | ORAL | Status: DC
  Filled 2017-12-24: qty 1

## 2017-12-24 MED ORDER — LOPERAMIDE HCL 2 MG PO CAPS: 2.0000 mg | ORAL_CAPSULE | Freq: Three times a day (TID) | ORAL | Status: DC | PRN

## 2017-12-24 MED ORDER — POTASSIUM CHLORIDE CRYS ER 20 MEQ PO TBCR: 20.0000 meq | EXTENDED_RELEASE_TABLET | Freq: Every day | ORAL | Status: DC | PRN

## 2017-12-24 MED ORDER — MORPHINE SULFATE (PF) 2 MG/ML IV SOLN
1.0000 mg | INTRAVENOUS | Status: DC | PRN
  Administered 2017-12-24 – 2017-12-26 (×8): 2 mg via INTRAVENOUS
  Filled 2017-12-24 (×8): qty 1

## 2017-12-24 MED ORDER — ONDANSETRON HCL 4 MG/2ML IJ SOLN
INTRAMUSCULAR | Status: AC
  Filled 2017-12-24: qty 2

## 2017-12-24 MED ORDER — LISINOPRIL 10 MG PO TABS
10.0000 mg | ORAL_TABLET | Freq: Every day | ORAL | Status: DC
  Administered 2017-12-24 – 2017-12-26 (×2): 10 mg via ORAL
  Filled 2017-12-24 (×3): qty 1

## 2017-12-24 MED ORDER — LABETALOL HCL 5 MG/ML IV SOLN: 10.0000 mg | INTRAVENOUS | Status: DC | PRN

## 2017-12-24 MED ORDER — BRIMONIDINE TARTRATE 0.2 % OP SOLN
1.0000 [drp] | Freq: Two times a day (BID) | OPHTHALMIC | Status: DC
  Administered 2017-12-24 – 2017-12-28 (×9): 1 [drp] via OPHTHALMIC
  Filled 2017-12-24: qty 5

## 2017-12-24 MED ORDER — CLOPIDOGREL BISULFATE 75 MG PO TABS
75.0000 mg | ORAL_TABLET | Freq: Every day | ORAL | Status: DC
  Administered 2017-12-24 – 2017-12-28 (×5): 75 mg via ORAL
  Filled 2017-12-24 (×5): qty 1

## 2017-12-24 MED ORDER — ONDANSETRON HCL 4 MG/2ML IJ SOLN
4.0000 mg | Freq: Four times a day (QID) | INTRAMUSCULAR | Status: DC | PRN
  Administered 2017-12-24 – 2017-12-27 (×3): 4 mg via INTRAVENOUS
  Filled 2017-12-24 (×3): qty 2

## 2017-12-24 MED ORDER — BISACODYL 5 MG PO TBEC
5.0000 mg | DELAYED_RELEASE_TABLET | Freq: Every day | ORAL | Status: DC | PRN
  Filled 2017-12-24: qty 1

## 2017-12-24 MED ORDER — IMATINIB MESYLATE 100 MG PO TABS
300.0000 mg | ORAL_TABLET | Freq: Every day | ORAL | Status: DC
  Filled 2017-12-24: qty 3

## 2017-12-24 MED ORDER — ROSUVASTATIN CALCIUM 10 MG PO TABS
5.0000 mg | ORAL_TABLET | Freq: Every day | ORAL | Status: DC
  Administered 2017-12-24 – 2017-12-28 (×4): 5 mg via ORAL
  Filled 2017-12-24 (×5): qty 1

## 2017-12-24 MED ORDER — METOPROLOL TARTRATE 5 MG/5ML IV SOLN: 2.0000 mg | INTRAVENOUS | Status: DC | PRN

## 2017-12-24 MED ORDER — SODIUM CHLORIDE 0.9 % IV SOLN
INTRAVENOUS | Status: DC
  Administered 2017-12-24 – 2017-12-25 (×3): via INTRAVENOUS

## 2017-12-24 MED ORDER — ASPIRIN EC 81 MG PO TBEC
81.0000 mg | DELAYED_RELEASE_TABLET | Freq: Every day | ORAL | Status: DC
  Administered 2017-12-25 – 2017-12-28 (×4): 81 mg via ORAL
  Filled 2017-12-24 (×5): qty 1

## 2017-12-24 MED ORDER — PHENOL 1.4 % MT LIQD: 1.0000 | OROMUCOSAL | Status: DC | PRN

## 2017-12-24 MED ORDER — HYDRALAZINE HCL 20 MG/ML IJ SOLN: 5.0000 mg | INTRAMUSCULAR | Status: DC | PRN

## 2017-12-24 MED ORDER — TIMOLOL MALEATE 0.5 % OP SOLN
1.0000 [drp] | Freq: Two times a day (BID) | OPHTHALMIC | Status: DC
  Administered 2017-12-24 – 2017-12-28 (×9): 1 [drp] via OPHTHALMIC
  Filled 2017-12-24: qty 5

## 2017-12-24 MED ORDER — SODIUM CHLORIDE 0.9 % IV SOLN: 500.0000 mL | Freq: Once | INTRAVENOUS | Status: DC | PRN

## 2017-12-24 MED ORDER — HEPARIN (PORCINE) 25000 UT/250ML-% IV SOLN
800.0000 [IU]/h | INTRAVENOUS | Status: DC
  Administered 2017-12-25: 800 [IU]/h via INTRAVENOUS
  Filled 2017-12-24: qty 250

## 2017-12-24 MED ORDER — DOCUSATE SODIUM 100 MG PO CAPS
100.0000 mg | ORAL_CAPSULE | Freq: Every day | ORAL | Status: DC
  Administered 2017-12-25 – 2017-12-28 (×4): 100 mg via ORAL
  Filled 2017-12-24 (×4): qty 1

## 2017-12-24 MED ORDER — MAGNESIUM SULFATE 2 GM/50ML IV SOLN: 2.0000 g | Freq: Every day | INTRAVENOUS | Status: DC | PRN

## 2017-12-24 MED ORDER — CEFAZOLIN SODIUM-DEXTROSE 2-4 GM/100ML-% IV SOLN
2.0000 g | Freq: Three times a day (TID) | INTRAVENOUS | Status: AC
  Administered 2017-12-24 (×2): 2 g via INTRAVENOUS
  Filled 2017-12-24 (×2): qty 100

## 2017-12-24 MED ORDER — POTASSIUM CHLORIDE CRYS ER 10 MEQ PO TBCR
10.0000 meq | EXTENDED_RELEASE_TABLET | Freq: Every day | ORAL | Status: DC
  Administered 2017-12-24 – 2017-12-28 (×4): 10 meq via ORAL
  Filled 2017-12-24 (×5): qty 1

## 2017-12-24 MED ORDER — VITAMIN D 25 MCG (1000 UNIT) PO TABS
1000.0000 [IU] | ORAL_TABLET | Freq: Every day | ORAL | Status: DC
  Administered 2017-12-24 – 2017-12-28 (×4): 1000 [IU] via ORAL
  Filled 2017-12-24 (×5): qty 1

## 2017-12-24 MED ORDER — FUROSEMIDE 40 MG PO TABS
40.0000 mg | ORAL_TABLET | Freq: Every day | ORAL | Status: DC
  Administered 2017-12-24 – 2017-12-28 (×4): 40 mg via ORAL
  Filled 2017-12-24 (×5): qty 1

## 2017-12-24 MED ORDER — ACETAMINOPHEN 325 MG RE SUPP: 325.0000 mg | RECTAL | Status: DC | PRN

## 2017-12-24 MED ORDER — IMATINIB MESYLATE 100 MG PO TABS
300.0000 mg | ORAL_TABLET | Freq: Every day | ORAL | Status: DC
  Administered 2017-12-25 – 2017-12-28 (×4): 300 mg via ORAL
  Filled 2017-12-24 (×7): qty 3

## 2017-12-24 NOTE — Anesthesia Postprocedure Evaluation (Signed)
Anesthesia Post Note  Patient: [name]  Procedure(s) Performed: AORTOGRAM, RIGHT LOWER EXTREMITY ANGIOGRAM, RIGHT SUPERFICIAL FEMORAL ARTERY ANGIOPLASTY WITH STENT, RIGHT POPLITEAL ARTERY ANGIOPLASTY WITH STENT, LEFT GROIN CUTDOWN AND LEFT FEMORAL ARTERY REPAIR (Right )     Patient location during evaluation: PACU Anesthesia Type: General Level of consciousness: awake and alert, awake and oriented Pain management: pain level controlled Vital Signs Assessment: post-procedure vital signs reviewed and stable Respiratory status: spontaneous breathing, nonlabored ventilation and respiratory function stable Cardiovascular status: blood pressure returned to baseline and stable Postop Assessment: no apparent nausea or vomiting Anesthetic complications: no    Last Vitals:  Vitals:   12/24/17 0448 12/24/17 0451  BP: (!) 129/118 104/63  Pulse: (!) 107 (!) 101  Resp: (!) 24 17  Temp: 36.8 C   SpO2: 100% 99%    Last Pain:  Vitals:   12/24/17 0448  TempSrc: Oral  PainSc:                  Catalina Gravel

## 2017-12-24 NOTE — Progress Notes (Signed)
[facility]  Subjective  - Right foot feels a little better, warmer.   Objective 104/63 (!) 101 98.3 F (36.8 C) (Oral) 17 99%  Intake/Output Summary (Last 24 hours) at 12/24/2017 0728 Last data filed at 12/24/2017 0700 Gross per 24 hour  Intake 4964 ml  Output 1525 ml  Net 3439 ml    AT doppler signal Right foot feels warm to touch, active range of motion of toes and ankle intact. Left groin soft without hematoma.  JP drain 20 cc output   Assessment/Planning: POD # 1 Procedure: 1. US guided access of the left common femoral artery 2.  Aortogram 3.  Right lower extremity arteriogram with selection of third order branches 4.  Right superficial femoral artery recanalization with angioplasty and stent placement (pre-dilated with 4 mm angioplasty balloon, 6 mm x 120 mm Innova x2, post-dilated with 5 mm angioplasty balloon) 5.  Right above-knee and below-knee popliteal artery recanalization with angioplasty and stent placement (pre-dilated 3 mm angioplasty balloon, 5 mm x 100 mm Tigris stent, post-dilated with 5 mm balloon) 6.  Cutdown on the left common femoral artery with primary repair  We will continue Heparin at 800 units/hr, start Plavix, and continue to observe his right LE for changes.   If her develops uncontrolled pain or other symptoms that are not tolerable he may end up with an amputation.  There are no other revascularization options.  Roxy Horseman 12/24/2017 7:28 AM --  Laboratory Lab Results: Recent Labs    12/23/17 1435 12/23/17 2151 12/24/17 0328  WBC 9.1  --  11.0*  HGB 10.4* 7.5* 9.2*  HCT 33.2* 22.0* 28.7*  PLT 227  --  159   BMET Recent Labs    12/23/17 0931 12/23/17 1435 12/23/17 2151  NA 141 140 140  K 4.2 4.1 3.7  CL 101 103  --   CO2 27 29  --   GLUCOSE 122* 126*  --   BUN 19 20  --   CREATININE 1.64* 1.91*  --   CALCIUM 9.4 9.4  --     COAG Lab Results  Component Value Date   INR  1.42 12/23/2017   INR 1.25 07/19/2017   INR 1.1 11/10/2016   No results found for: PTT

## 2017-12-24 NOTE — Progress Notes (Signed)
[facility]  Subjective  - States right foot still numb, pain may be slighty improved.  Has AT signal now just above ankle.   Objective 104/63 (!) 101 98.3 F (36.8 C) (Oral) 17 99%  Intake/Output Summary (Last 24 hours) at 12/24/2017 1022 Last data filed at 12/24/2017 0700 Gross per 24 hour  Intake 4964 ml  Output 1525 ml  Net 3439 ml    Left groin ecchymosis, no overt hematoma now, drain in place Right AT signal above ankle - no DP or PT in foot, foot slightly warmer Left DP brisk signal  Laboratory Lab Results: Recent Labs    12/23/17 1435 12/23/17 2151 12/24/17 0328  WBC 9.1  --  11.0*  HGB 10.4* 7.5* 9.2*  HCT 33.2* 22.0* 28.7*  PLT 227  --  159   BMET Recent Labs    12/23/17 0931 12/23/17 1435 12/23/17 2151  NA 141 140 140  K 4.2 4.1 3.7  CL 101 103  --   CO2 27 29  --   GLUCOSE 122* 126*  --   BUN 19 20  --   CREATININE 1.64* 1.91*  --   CALCIUM 9.4 9.4  --     COAG Lab Results  Component Value Date   INR 1.42 12/23/2017   INR 1.25 07/19/2017   INR 1.1 11/10/2016   No results found for: PTT  Assessment/Planning:  POD#1 extensive right lower extremity revascularization for acute on chronic limb ischemia.  Discussed with family no further revascularization options.  Now has AT signal after revascualrization (no signals pre-op).  Will see how he does on heparin and monitor his right foot for level of symptoms.  If no improvement high risk for limb loss.  No outflow in foot after intervention with multi-level occlusive disease in SFA, pop, and tibials. Plavix, heparin for now.    [name] 12/24/2017 10:22 AM --

## 2017-12-24 NOTE — Progress Notes (Signed)
Patient received via stretcher alert and orin. Orin. To room and R.N. Into do assessment. V.S.S.

## 2017-12-24 NOTE — Evaluation (Signed)
Physical Therapy Evaluation Patient Details Name: [name] MRN: [id number] DOB: 23-Aug-1940 Today's Date: 12/24/2017   History of Present Illness  Pt adm with acute limb ischemia of right lower extremity. Pt underwent extensive RLE revascularization attempts with recanalization with angioplasty and stent placement on 11/14. Pt has no other options for revascularization and is at high risk for limb loss. 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 guided aspiration of left SCM on 11/03/17, CKD, PVD  Clinical Impression  Pt seen this afternoon and assisted back to bed from recliner. Had attempted earlier in the AM but pt nauseous and then had just gotten to chair with nursing. After I had evaluated the pt the PT order was discontinued. Therefore no goals set and will need re-order when appropriate.     Follow Up Recommendations Other (comment)(To be determined. Dependent on need for further surgery)    Equipment Recommendations  None recommended by PT    Recommendations for Other Services       Precautions / Restrictions Precautions Precautions: Fall Restrictions Weight Bearing Restrictions: No      Mobility  Bed Mobility Overal bed mobility: Needs Assistance Bed Mobility: Sit to Supine       Sit to supine: Min assist   General bed mobility comments: Assist to guide trunk and to bring legs back up into bed.  Transfers Overall transfer level: Needs assistance Equipment used: 4-wheeled walker Transfers: Sit to/from [facility] Sit to Stand: Mod assist;+2 safety/equipment Stand pivot transfers: Min assist;+2 safety/equipment       General transfer comment: Assist to bring hips up and for balance. Pt used rollator to go from chair to bed.  Ambulation/Gait Ambulation/Gait assistance: Min  assist;+2 safety/equipment Gait Distance (Feet): 2 Feet Assistive device: 4-wheeled walker Gait Pattern/deviations: Step-to pattern;Decreased step length - right;Decreased step length - left;Trunk flexed Gait velocity: decr Gait velocity interpretation: <1.31 ft/sec, indicative of household ambulator General Gait Details: Pt took amb 2' from recliner to bed using rollator. Assist for balance and support  Stairs            Wheelchair Mobility    Modified Rankin (Stroke Patients Only)       Balance Overall balance assessment: Needs assistance Sitting-balance support: No upper extremity supported;Feet supported Sitting balance-Leahy Scale: Good     Standing balance support: Bilateral upper extremity supported Standing balance-Leahy Scale: Poor Standing balance comment: rollator and min assist for static standing                             Pertinent Vitals/Pain Pain Assessment: Faces Faces Pain Scale: Hurts whole lot Pain Location: rt foot Pain Descriptors / Indicators: Guarding;Grimacing Pain Intervention(s): Limited activity within patient's tolerance;Repositioned    Home Living Family/patient expects to be discharged to:: Private residence Living Arrangements: Spouse/significant other Available Help at Discharge: Family;Available 24 hours/day Type of Home: House Home Access: Stairs to enter Entrance Stairs-Rails: Left Entrance Stairs-Number of Steps: 3 from porch, then 9 to first floor  Home Layout: One level Home Equipment: Shower seat - built in;Walker - 2 wheels;Cane - single point      Prior Function Level of Independence: Needs assistance   Gait / Transfers Assistance Needed: Pt amb with cane or rolling walker modified independent           Hand  Dominance   Dominant Hand: Right    Extremity/Trunk Assessment   Upper Extremity Assessment Upper Extremity Assessment: Defer to OT evaluation    Lower Extremity Assessment Lower  Extremity Assessment: Generalized weakness       Communication   Communication: HOH  Cognition Arousal/Alertness: Awake/alert Behavior During Therapy: WFL for tasks assessed/performed Overall Cognitive Status: Within Functional Limits for tasks assessed                                        General Comments      Exercises     Assessment/Plan    PT Assessment Patient needs continued PT services  PT Problem List Decreased strength;Decreased activity tolerance;Decreased balance;Decreased mobility;Pain       PT Treatment Interventions DME instruction;Gait training;Stair training;Functional mobility training;Therapeutic activities;Therapeutic exercise;Balance training;Patient/family education    PT Goals (Current goals can be found in the Care Plan section)       Frequency Min 3X/week   Barriers to discharge Inaccessible home environment stairs to enter home    Co-evaluation               AM-PAC PT "6 Clicks" Daily Activity  Outcome Measure Difficulty turning over in bed (including adjusting bedclothes, sheets and blankets)?: Unable Difficulty moving from lying on back to sitting on the side of the bed? : Unable Difficulty sitting down on and standing up from a chair with arms (e.g., wheelchair, bedside commode, etc,.)?: Unable Help needed moving to and from a bed to chair (including a wheelchair)?: A Lot Help needed walking in hospital room?: A Lot Help needed climbing 3-5 steps with a railing? : Total 6 Click Score: 8    End of Session Equipment Utilized During Treatment: Gait belt Activity Tolerance: Patient limited by fatigue;Patient limited by pain Patient left: in bed;with call bell/phone within reach;with bed alarm set;with family/visitor present Nurse Communication: Mobility status PT Visit Diagnosis: Other abnormalities of gait and mobility (R26.89);Difficulty in walking, not elsewhere classified (R26.2);Pain Pain - Right/Left:  Right Pain - part of body: Ankle and joints of foot    Time: 2263-3354 PT Time Calculation (min) (ACUTE ONLY): 14 min   Charges:   PT Evaluation $PT Eval Moderate Complexity: [place] Pager [phone] Office [place] 12/24/2017, 3:13 PM

## 2017-12-24 NOTE — Transfer of Care (Signed)
Immediate Anesthesia Transfer of Care Note  Patient: [name]  Procedure(s) Performed: AORTOGRAM, RIGHT LOWER EXTREMITY ANGIOGRAM, RIGHT SUPERFICIAL FEMORAL ARTERY ANGIOPLASTY WITH STENT, RIGHT POPLITEAL ARTERY ANGIOPLASTY WITH STENT, LEFT GROIN CUTDOWN AND LEFT FEMORAL ARTERY REPAIR (Right )  Patient Location: PACU  Anesthesia Type:General  Level of Consciousness: sedated and patient cooperative  Airway & Oxygen Therapy: Patient connected to face mask oxygen  Post-op Assessment: Report given to RN and Post -op Vital signs reviewed and stable  Post vital signs: Reviewed and stable  Last Vitals:  Vitals Value Taken Time  BP 142/58 12/24/2017 12:31 AM  Temp    Pulse 84 12/24/2017 12:41 AM  Resp 14 12/24/2017 12:41 AM  SpO2 100 % 12/24/2017 12:41 AM  Vitals shown include unvalidated device data.  Last Pain:  Vitals:   12/23/17 1422  TempSrc:   PainSc: 4          Complications: No apparent anesthesia complications

## 2017-12-24 NOTE — Op Note (Signed)
Date: December 23, 2017  Preoperative diagnosis: Acute on chronic limb ischemia of the right lower extremity  Postoperative diagnosis: Acute on chronic limb ischemia of the right lower extremity  Procedure: 1. US guided access of the left common femoral artery 2.  Aortogram 3.  Right lower extremity arteriogram with selection of third order branches 4.  Right superficial femoral artery recanalization with angioplasty and stent placement (pre-dilated with 4 mm angioplasty balloon, 6 mm x 120 mm Innova x2, post-dilated with 5 mm angioplasty balloon) 5.  Right above-knee and below-knee popliteal artery recanalization with angioplasty and stent placement (pre-dilated 3 mm angioplasty balloon, 5 mm x 100 mm Tigris stent, post-dilated with 5 mm balloon) 6.  Cutdown on the left common femoral artery with primary repair  Surgeon: Dr. [name], MD  Assistant: Roxy Horseman, PA  Indications: Patient is a 77 year old male who presented to cardiology clinic today with acute symptoms of right foot numbness and pain over the last week.  Lower extremity duplex was obtained that showed no flow in the distal SFA and popliteal artery and ABIs were not calculated since he had no flow at the ankle.  He was subsequently sent to the ED for further evaluation.  It should be noted that the patient developed the symptoms while on Eliquis fully anticoagulated.  He presents after risks and benefits were discussed.  Findings: Patient had no significant aortoiliac disease after CO2 aortogram.  Right lower extremity arteriogram showed approximate 50% stenosis of the right common femoral artery that did not appear flow-limiting with a patent profunda.  Ultimately his SFA was heavily calcified with multilevel high-grade stenosis throughout the mid to distal segment.  He occluded his distal SFA as well as he had an occluded above and below-knee popliteal artery with minimal reconstitution distally.  Initial  arteriogram of right lower extremity showed no distal target including an occluded peroneal, posterior tibial, and anterior tibial artery.  After we got a wire and catheter across his SFA and popliteal artery into the native tibioperoneal trunk where we reentered the true lumen we were able to do a hand injected arteriogram that showed that the peroneal and posterior tibial was occluded with no distal reconstitution.  There was filling of an anterior tibial that was heavily diseased but more robust proximally and then became heavily diseased and near diminutive in the distal calf.  On one delayed image early in the case we saw filling of a very diseased and diminutive dorsalis pedis.  There was no distal bypass target and on additional injections never saw the dorsalis pedis fill.   We had significant difficulty getting our wires and catheters to track through his SFA and popliteal occlusions and this did not behave like an acute process that would be amenable to thrombolysis.  My initial thought was that if we could recanalize his SFA and popliteal artery we could get flow down his anterior tibial which was his only patent artery in his calf.  Unfortunately after nearly 5 hours of work we were able to recanalize his SFA and popliteal artery but had no outflow.  The tibioperoneal trunk would fill as well as the anterior tibial in the proximal to mid calf.  However the anterior tibial occluded in the distal calf with no inflow into the foot.  After extensive amount of time I did not think any further efforts would likely save his leg.  Given the severity of his disease with heavy calcifications and multilevel occlusions I do  not think passing Fogarty catheters would be productive and without a bypass target I stopped any further intervention.  He will be taken upstairs on heparin to warm his foot and see how he responds.  Details: The patient was taken the operating room after informed consent was obtained.  He was  placed on operating table in supine position.  His bilateral groins as well as his right leg were prepped and draped in usual sterile fashion.  After general anesthesia was induced we performed a prep timeout to identify patient, procedure, site.  Initially accessed his left common femoral artery with a micro access sheath and then placed a Bentson wire into his infrarenal order over which we placed a short 5 French sheath.  A CO2 aortogram was obtained that showed no significant aortoiliac disease.  Then used an Omni Flush catheter and a Bentson wire to cannulate his contralateral common iliac and advanced a Bentson wire into his distal right external iliac artery.  A right lower extremity arteriogram was then obtained that showed heavily calcified and diseased SFA throughout its proximal mid course with the distal SFA occluded as well as an above-knee and below-knee popliteal artery that were also occluded.  No distal reconstitution of a bypass target.  Ultimately my impression was that his distal SFA and popliteal artery are likely the source of his acute on chronic event.  Given the fact that he had no distal bypass target and I do not think Fogarty catheters would pass I thought his only option was an endovascular option.  I exchanged for a long 6 [place] Ansell sheath in the left groin after using a Glidewire advantage.  The patient was then given heparin and ACTs were checked throughout the case to maintain them greater than 250.  I then used the Glidewire advantage to recanalizes SFA into the above-knee and subsequently below-knee popliteal artery where I reentered the true lumen and the tibioperoneal trunk that I confirmed with hand-injection.  Unfortunately I could not get a quick cross 0.035 catheter to track over the Glidewire advantage given the severity of his calcific disease.  In addition there was no distal outflow from the peroneal or posterior tibial artery and no distal reconstitution.  He did  have a anterior tibial that filled proximally but then became very diminutive in the distal calf.  My hope is that by intervening on his SFA and popliteal artery this was potentially underfilled and we could provide some endovascular option.  Ultimately had to exchange for a 018 wire with a 018 catheter to get down into the tibioperoneal trunk.  Over this I then performed a subintimal  angioplasty of the popliteal and likely distal SFA using a long 4 mm angioplasty balloon.  I did a brief hand-injection that showed in-line flow into the tibioperoneal trunk although the SFA and popliteal artery were heavily calcified.  Ultimately I elected to stent the mid to distal right SFA using 6 mm x 120 mm self expanding Innova stents and deployed two of these.  We spent several hours pre-dilating the calcified lesions in the SFA in order to get her stents to track given that we could not get enough support over our wire.  Ultimately these were eventually postdilated with 5 mm angioplasty balloons.  But this point time another injection to confirm that his SFA stents were patent but the popliteal artery that we had angioplastied had already reoccluded and I felt like this needed to be stented.  I elected to use  a Tigris 5 mm x 100 mm stent after looking at the aortogram pictures.  We spent several hours trying to get the [facility] stent to track into the popliteal artery in the right leg with significant difficulty.  Ultimately using a multitude of predilation techniques the stent kept getting hung up on our proximal extent of her SFA stent.  Finally after a multitude of wire and catheter exchanges we were able to exchange for a long 8 French sheath after having upsized and were able to advance the sheath into the right SFA stents that have been placed.  Through this I was finally able to get the [facility] stent into the below-knee to above-knee popliteal artery that was diseased and deployed a 5 mm x 100 mm Tigris stent.  Again we  went to post angioplasty this and had a lot of difficulty getting anything to track and finally was able to postdilate this with a 5 mm angioplasty balloon.  Should be noted that while attempting to retrieve the 5 mm [place] balloon the balloon ruptured in the sheath and would not completely come off the wire.  I lost wire access and balloon at the same time.  Ultimately I was able to see the balloon with contrast still left in the sheath and I got a wire passed it down into the SFA and then exchanged for a different 8 French sheath.  I did confirm once the sheath was out that we had retrieved the piece of the balloon that had broken off and on inspection it appeared that we had all pieces of the angioplasty balloon and this was documented.  Final injection arteriogram once I advanced the catheter all the way down into the below-knee popliteal artery where we had recanalized showed again filling of the tibioperoneal trunk, no filling of the peroneal or posterior tibial artery, and only proximal anterior tibial that occluded in the distal calf.  There was no distal target reconstituting and nothing that was amenable to a retrograde tibial access. At this point in time wires and catheters removed the right lower extremity and short 8 French sheath was placed in the left groin.  2 Perclose devices were deployed.  Unfortunately these failed and the patient had a brisk bleeding with a hematoma in the left groin.  Ultimately I put a 8 French short sheath back in place over the Bentson wire cutdown on the left common femoral artery.  Ultimately the common femoral artery was dissected out proximally controlled with a vessel loop and a clamp and then the distal SFA and profunda were dissected out.  I then repaired the arteriotomy with interrupted 6-0 Prolenes.  We went and checked that the patient had a brisk left dorsalis pedis signal at the completion of the case.  The left groin was then irrigated and then closed in  multiple layers using 2-0 Vicryl and 3-0 Vicryl in multiple layers and 4 Monocryl in the skin and then we placed a 15 French drain given the patient will be on blood thinner and is on Eliquis at this time.  Plan: Patient will be taken upstairs on heparin drip and warmed up to see if he has any improvement in his right leg.  I think he is at exceedingly high risk for limb loss given that he has no outflow and I suspect that our endovascular intervention to recanalize his SFA and popliteal artery will not be durable and he may require amputation.  As noted above there was no distal  bypass target in the foot or distal calf.  I did not think thrombectomy was durable given chronic occlusions throughout his SFA popliteal and tibial segments.  In addition I was hopeful thrombolysis would have been an option in this case but our wires and catheters did not behave like fresh throismbus and we had a recanalize chronic total occlusions.  [name], MD [facility] [place] Office: [phone] Pager: [place]

## 2017-12-24 NOTE — Progress Notes (Signed)
Lab/phlebotomy notified of patient's arrival to PACU at [facility].

## 2017-12-24 NOTE — Progress Notes (Signed)
Heparin gtt per MD. Critical alerts for heparin level and aPTT this morning. The heparin level would be falsely elevated due to Eliquis use. Heparin had not been running long before aPTT drawn and > 200. I suspect this is a lab error. Discussed with Vascular and will continue heparin gtt at 800 units/hr per MD.  Elenor Quinones, PharmD, BCPS Clinical Pharmacist Phone number [phone] 12/24/2017 11:05 AM

## 2017-12-25 LAB — CBC
HEMATOCRIT: 28.7 % — AB (ref 39.0–52.0)
HEMATOCRIT: 23.5 % — AB (ref 39.0–52.0)
HEMOGLOBIN: 9.2 g/dL — AB (ref 13.0–17.0)
HEMOGLOBIN: 7.6 g/dL — AB (ref 13.0–17.0)
MCH: 31.2 pg (ref 26.0–34.0)
MCH: 31.8 pg (ref 26.0–34.0)
MCHC: 32.1 g/dL (ref 30.0–36.0)
MCHC: 32.3 g/dL (ref 30.0–36.0)
MCV: 97.3 fL (ref 80.0–100.0)
MCV: 98.3 fL (ref 80.0–100.0)
Platelets: 159 10*3/uL (ref 150–400)
Platelets: 128 10*3/uL — ABNORMAL LOW (ref 150–400)
RBC: 2.95 MIL/uL — ABNORMAL LOW (ref 4.22–5.81)
RBC: 2.39 MIL/uL — ABNORMAL LOW (ref 4.22–5.81)
RDW: 15.9 % — AB (ref 11.5–15.5)
RDW: 16.6 % — AB (ref 11.5–15.5)
WBC: 11 10*3/uL — AB (ref 4.0–10.5)
WBC: 10 10*3/uL (ref 4.0–10.5)
nRBC: 0 % (ref 0.0–0.2)
nRBC: 0 % (ref 0.0–0.2)

## 2017-12-25 LAB — BASIC METABOLIC PANEL
Anion gap: 5 (ref 5–15)
BUN: 16 mg/dL (ref 8–23)
CALCIUM: 7.7 mg/dL — AB (ref 8.9–10.3)
CHLORIDE: 109 mmol/L (ref 98–111)
CO2: 25 mmol/L (ref 22–32)
CREATININE: 1.77 mg/dL — AB (ref 0.61–1.24)
GFR calc non Af Amer: 35 mL/min — ABNORMAL LOW (ref >60)
GFR, EST AFRICAN AMERICAN: 40 mL/min — AB (ref >60)
Glucose, Bld: 118 mg/dL — ABNORMAL HIGH (ref 70–99)
Potassium: 3.8 mmol/L (ref 3.5–5.1)
SODIUM: 139 mmol/L (ref 135–145)

## 2017-12-25 LAB — HEPARIN LEVEL (UNFRACTIONATED): HEPARIN UNFRACTIONATED: 1.76 [IU]/mL — AB (ref 0.30–0.70)

## 2017-12-25 LAB — APTT: aPTT: 108 seconds — ABNORMAL HIGH (ref 24–36)

## 2017-12-25 MED ORDER — PYRIDOSTIGMINE BROMIDE 60 MG PO TABS
60.0000 mg | ORAL_TABLET | Freq: Every day | ORAL | Status: DC
  Administered 2017-12-25 – 2017-12-28 (×4): 60 mg via ORAL
  Filled 2017-12-25 (×4): qty 1

## 2017-12-25 MED ORDER — POTASSIUM CHLORIDE CRYS ER 20 MEQ PO TBCR
20.0000 meq | EXTENDED_RELEASE_TABLET | Freq: Once | ORAL | Status: AC
  Administered 2017-12-25: 20 meq via ORAL
  Filled 2017-12-25: qty 1

## 2017-12-25 MED ORDER — FUROSEMIDE 10 MG/ML IJ SOLN
40.0000 mg | Freq: Once | INTRAMUSCULAR | Status: AC
  Administered 2017-12-25: 40 mg via INTRAVENOUS
  Filled 2017-12-25: qty 4

## 2017-12-25 MED ORDER — APIXABAN 5 MG PO TABS
5.0000 mg | ORAL_TABLET | Freq: Two times a day (BID) | ORAL | Status: DC
  Administered 2017-12-25 – 2017-12-28 (×7): 5 mg via ORAL
  Filled 2017-12-25 (×7): qty 1

## 2017-12-25 NOTE — Progress Notes (Signed)
Patient refused to walk in the hall, walked from bed to bathroom,will monitor.

## 2017-12-25 NOTE — Progress Notes (Signed)
Patient's aPTT slightly above goal at 108 (due to prior to admission apixaban, not correlating with heparin level which is elevated). Spoke to RN who reports no bleeding and no issues with infusion overnight. Did not adjust rate as heparin infusion per MD. Discussed w Vascular MD on call who will assess on rounds this AM.   Brendolyn Patty, PharmD PGY1 Pharmacy Resident Phone [phone]  12/25/2017   8:20 AM

## 2017-12-25 NOTE — Progress Notes (Addendum)
Progress Note    12/25/2017 9:16 AM 2 Days Post-Op  Subjective:  Numbness R foot; soreness L groin   Vitals:   12/25/17 0401 12/25/17 0800  BP: (!) 116/56 (!) 115/44  Pulse: 91 (!) 103  Resp: 18 14  Temp: 98.9 F (37.2 C) 98.2 F (36.8 C)  SpO2: 95% 99%   Physical Exam: Lungs:  Non labored Incisions:  L groin incision with local ecchymosis, JP with moderate sanguinous output overnight Extremities:  R AT brisk by doppler; distal foot cooler to touch than proximal foot; RLE edema to the level of the mid shin Abdomen:  soft Neurologic: A&O  CBC    Component Value Date/Time   WBC 10.0 12/25/2017 0359   RBC 2.39 (L) 12/25/2017 0359   HGB 7.6 (L) 12/25/2017 0359   HGB 10.1 (L) 12/15/2017 1242   HGB 12.4 (L) 11/21/2012 1008   HCT 23.5 (L) 12/25/2017 0359   HCT 30.8 (L) 12/15/2017 1242   HCT 35.8 (L) 11/21/2012 1008   PLT 128 (L) 12/25/2017 0359   PLT 216 12/15/2017 1242   MCV 98.3 12/25/2017 0359   MCV 100 (H) 12/15/2017 1242   MCV 90.7 11/21/2012 1008   MCH 31.8 12/25/2017 0359   MCHC 32.3 12/25/2017 0359   RDW 16.6 (H) 12/25/2017 0359   RDW 14.3 12/15/2017 1242   RDW 13.7 11/21/2012 1008   LYMPHSABS 1.7 12/23/2017 1435   LYMPHSABS 1.1 11/21/2012 1008   MONOABS 0.9 12/23/2017 1435   MONOABS 0.6 11/21/2012 1008   EOSABS 0.0 12/23/2017 1435   EOSABS 0.1 11/21/2012 1008   BASOSABS 0.0 12/23/2017 1435   BASOSABS 0.0 11/21/2012 1008    BMET    Component Value Date/Time   NA 139 12/25/2017 0359   NA 141 12/23/2017 0931   NA 141 02/26/2012 0852   K 3.8 12/25/2017 0359   K 3.4 (L) 02/26/2012 0852   CL 109 12/25/2017 0359   CL 105 02/26/2012 0852   CO2 25 12/25/2017 0359   CO2 28 02/26/2012 0852   GLUCOSE 118 (H) 12/25/2017 0359   GLUCOSE 77 02/26/2012 0852   BUN 16 12/25/2017 0359   BUN 19 12/23/2017 0931   BUN 13.0 02/26/2012 0852   CREATININE 1.77 (H) 12/25/2017 0359   CREATININE 1.33 05/11/2013 1246   CREATININE 1.3 02/26/2012 0852   CALCIUM 7.7  (L) 12/25/2017 0359   CALCIUM 9.1 02/26/2012 0852   GFRNONAA 35 (L) 12/25/2017 0359   GFRAA 40 (L) 12/25/2017 0359    INR    Component Value Date/Time   INR 1.42 12/23/2017 1435     Intake/Output Summary (Last 24 hours) at 12/25/2017 0916 Last data filed at 12/25/2017 0600 Gross per 24 hour  Intake 2915.26 ml  Output 615 ml  Net 2300.26 ml     Assessment/Plan:  77 y.o. male is 2 Days Post-Op 1. US guided access of the left common femoral artery 2. Aortogram 3. Right lower extremity arteriogram with selection of third order branches 4. Right superficial femoral artery recanalization with angioplasty and stent placement (pre-dilated with 4 mm angioplasty balloon,6 mm x 120 mmInnova x2, post-dilated with 5 mm angioplasty balloon) 5. Right above-knee and below-knee popliteal artery recanalization with angioplasty and stent placement (pre-dilated 3 mm angioplasty balloon,5 mm x 100 mm Tigris stent, post-dilated with 5 mm balloon) 6. Cutdown on the left common femoral artery with primary repair    Dopplerable ATA to the level of the ankle; distal foot cool to touch L groin incision unremarkable;  continue JP drain at least another 24h Overlap Eliquis and current IV heparin dosage today Encouraged ambulation/ participation with PT   Dagoberto Ligas, PA-C [facility] [phone] 12/25/2017 9:16 AM  Agree with above.  Foot viable with AT doppler no further options for revasc. JP 55 cc leave one more day Start plavix Eliquis today  D/c heparin tomorrow, continue current rate Ambulate Extra dose of lasix and potassium today for total body edema Saline lock IV with exception of heparin  Ruta Hinds, MD [facility] [place] Office: [phone] Pager: [phone]

## 2017-12-25 NOTE — Progress Notes (Signed)
Patient walked from bed to the door of his room using a walker with standby assist, patient not willing to walk in the hall at this time,patient education completed on the need for him to walk, he verbalized understanding patient assisted to the recliner, call bell within reach will monitor.

## 2017-12-26 LAB — BASIC METABOLIC PANEL
ANION GAP: 9 (ref 5–15)
BUN: 21 mg/dL (ref 8–23)
CO2: 21 mmol/L — AB (ref 22–32)
Calcium: 7.9 mg/dL — ABNORMAL LOW (ref 8.9–10.3)
Chloride: 108 mmol/L (ref 98–111)
Creatinine, Ser: 2.38 mg/dL — ABNORMAL HIGH (ref 0.61–1.24)
GFR, EST AFRICAN AMERICAN: 28 mL/min — AB (ref >60)
GFR, EST NON AFRICAN AMERICAN: 24 mL/min — AB (ref >60)
Glucose, Bld: 120 mg/dL — ABNORMAL HIGH (ref 70–99)
POTASSIUM: 4.3 mmol/L (ref 3.5–5.1)
Sodium: 138 mmol/L (ref 135–145)

## 2017-12-26 LAB — CBC
HEMATOCRIT: 21 % — AB (ref 39.0–52.0)
Hemoglobin: 6.4 g/dL — CL (ref 13.0–17.0)
MCH: 30.6 pg (ref 26.0–34.0)
MCHC: 30.5 g/dL (ref 30.0–36.0)
MCV: 100.5 fL — AB (ref 80.0–100.0)
NRBC: 0 % (ref 0.0–0.2)
PLATELETS: 147 10*3/uL — AB (ref 150–400)
RBC: 2.09 MIL/uL — AB (ref 4.22–5.81)
RDW: 15.8 % — ABNORMAL HIGH (ref 11.5–15.5)
WBC: 9.6 10*3/uL (ref 4.0–10.5)

## 2017-12-26 LAB — PREPARE RBC (CROSSMATCH)

## 2017-12-26 LAB — APTT: aPTT: 177 seconds (ref 24–36)

## 2017-12-26 LAB — HEPARIN LEVEL (UNFRACTIONATED): Heparin Unfractionated: >2.2 IU/mL — ABNORMAL HIGH (ref 0.30–0.70)

## 2017-12-26 MED ORDER — SODIUM CHLORIDE 0.9% IV SOLUTION
Freq: Once | INTRAVENOUS | Status: AC
  Administered 2017-12-26: 08:00:00 via INTRAVENOUS

## 2017-12-26 NOTE — Progress Notes (Addendum)
Second unit (2 of 2) of blood fresh packed RBC's transfusion started.

## 2017-12-26 NOTE — Progress Notes (Signed)
JP drain removed, per order.  Patient tolerated procedure well.

## 2017-12-26 NOTE — Progress Notes (Signed)
Spoke with Dr. Oneida Alar, MD (Vasular Surgery) in regards to quarter sized hematoma in left groin.  Received verbal order with read back to d/c JP drain with new onset of hematoma and low volume of output (5 cc) today.

## 2017-12-26 NOTE — Progress Notes (Addendum)
Quarter sized hematoma observed on Left groin site.  Paged Arlee Muslim, [place] at [phone] and Dr. Ruta Hinds, MD at [phone].

## 2017-12-26 NOTE — Progress Notes (Signed)
CRITICAL VALUE ALERT  Critical Value: HGB 6.4  Date & Time Notied:  17 Dec 28 528  Provider Notified: Dr Oneida Alar  Orders Received/Actions taken: d/c heparin and 2 units of pack red blood cells will continue to monitor

## 2017-12-26 NOTE — Progress Notes (Signed)
Second unit (2 of 2) of blood fresh packed RBC's transfusion completed.  Vital signs stable, no adverse reaction to transfusion noted.

## 2017-12-26 NOTE — Progress Notes (Signed)
[facility]  Subjective  - left groin sore   Objective 125/63 80 97.6 F (36.4 C) (Axillary) 16 97%  Intake/Output Summary (Last 24 hours) at 12/26/2017 0902 Last data filed at 12/26/2017 0850 Gross per 24 hour  Intake 1262.04 ml  Output 750 ml  Net 512.04 ml   JP drain 75 mL Left groin incision clean no hematoma Right foot DP peroneal doppler brisk, faint PT doppler foot warm  Assessment/Planning: S/p right SFA pop stent has perfused left foot no real other options Continue Eliquis Plavix Acute blood loss anemia will transfuse 2 units of blood today Possible d/c home tomorrow if hemoglobin stable D/c JP when 24 hour output less than 30 ml  Ruta Hinds 12/26/2017 9:02 AM --  Laboratory Lab Results: Recent Labs    12/25/17 0359 12/26/17 0340  WBC 10.0 9.6  HGB 7.6* 6.4*  HCT 23.5* 21.0*  PLT 128* 147*   BMET Recent Labs    12/25/17 0359 12/26/17 0340  NA 139 138  K 3.8 4.3  CL 109 108  CO2 25 21*  GLUCOSE 118* 120*  BUN 16 21  CREATININE 1.77* 2.38*  CALCIUM 7.7* 7.9*    COAG Lab Results  Component Value Date   INR 1.42 12/23/2017   INR 1.25 07/19/2017   INR 1.1 11/10/2016   No results found for: PTT

## 2017-12-26 NOTE — Progress Notes (Addendum)
First unit (1 of 2) of blood fresh packed RBC's transfusion started.

## 2017-12-26 NOTE — Progress Notes (Signed)
Pt with critical PTT 177 pharmacy monitoring spoke with with Veronda, heparin already d/c will continue to monitor

## 2017-12-26 NOTE — Progress Notes (Addendum)
First unit (1 of 2) of blood fresh packed RBC's transfusion completed. Vital signs stable, no adverse reaction to transfusion noted.

## 2017-12-27 DIAGNOSIS — Z8673 Personal history of transient ischemic attack (TIA), and cerebral infarction without residual deficits: Secondary | ICD-10-CM

## 2017-12-27 DIAGNOSIS — E119 Type 2 diabetes mellitus without complications: Secondary | ICD-10-CM

## 2017-12-27 DIAGNOSIS — N183 Chronic kidney disease, stage 3 unspecified: Secondary | ICD-10-CM

## 2017-12-27 DIAGNOSIS — I10 Essential (primary) hypertension: Secondary | ICD-10-CM

## 2017-12-27 DIAGNOSIS — L8990 Pressure ulcer of unspecified site, unspecified stage: Secondary | ICD-10-CM

## 2017-12-27 DIAGNOSIS — I7090 Unspecified atherosclerosis: Secondary | ICD-10-CM

## 2017-12-27 DIAGNOSIS — I739 Peripheral vascular disease, unspecified: Secondary | ICD-10-CM

## 2017-12-27 DIAGNOSIS — G8918 Other acute postprocedural pain: Secondary | ICD-10-CM

## 2017-12-27 DIAGNOSIS — Z86711 Personal history of pulmonary embolism: Secondary | ICD-10-CM

## 2017-12-27 LAB — CBC
HCT: 29.5 % — ABNORMAL LOW (ref 39.0–52.0)
HEMOGLOBIN: 9.7 g/dL — AB (ref 13.0–17.0)
MCH: 30.6 pg (ref 26.0–34.0)
MCHC: 32.9 g/dL (ref 30.0–36.0)
MCV: 93.1 fL (ref 80.0–100.0)
NRBC: 0 % (ref 0.0–0.2)
Platelets: 152 10*3/uL (ref 150–400)
RBC: 3.17 MIL/uL — AB (ref 4.22–5.81)
RDW: 17.5 % — AB (ref 11.5–15.5)
WBC: 8 10*3/uL (ref 4.0–10.5)

## 2017-12-27 LAB — BASIC METABOLIC PANEL
ANION GAP: 8 (ref 5–15)
BUN: 21 mg/dL (ref 8–23)
CALCIUM: 8.1 mg/dL — AB (ref 8.9–10.3)
CO2: 21 mmol/L — ABNORMAL LOW (ref 22–32)
Chloride: 106 mmol/L (ref 98–111)
Creatinine, Ser: 2.17 mg/dL — ABNORMAL HIGH (ref 0.61–1.24)
GFR calc non Af Amer: 27 mL/min — ABNORMAL LOW (ref >60)
GFR, EST AFRICAN AMERICAN: 32 mL/min — AB (ref >60)
Glucose, Bld: 110 mg/dL — ABNORMAL HIGH (ref 70–99)
POTASSIUM: 4.4 mmol/L (ref 3.5–5.1)
Sodium: 135 mmol/L (ref 135–145)

## 2017-12-27 LAB — TYPE AND SCREEN
ABO/RH(D): A POS
Antibody Screen: NEGATIVE
UNIT DIVISION: 0
Unit division: 0
Unit division: 0
Unit division: 0

## 2017-12-27 LAB — BPAM RBC
BLOOD PRODUCT EXPIRATION DATE: 201912072359
Blood Product Expiration Date: 201912062359
Blood Product Expiration Date: 201912072359
Blood Product Expiration Date: 201912072359
ISSUE DATE / TIME: 201911142302
ISSUE DATE / TIME: 201911170810
ISSUE DATE / TIME: 201911171251
ISSUE DATE / TIME: 201911142302
UNIT TYPE AND RH: 6200
Unit Type and Rh: 6200
Unit Type and Rh: 6200
Unit Type and Rh: 6200

## 2017-12-27 MED ORDER — LACTULOSE 10 GM/15ML PO SOLN
10.0000 g | Freq: Once | ORAL | Status: AC
  Administered 2017-12-27: 10 g via ORAL
  Filled 2017-12-27: qty 15

## 2017-12-27 NOTE — Consult Note (Signed)
Physical Medicine and Rehabilitation Consult Reason for Consult:  Decreased functional mobility Referring Physician:  Dr. Carlis Abbott   HPI: [name] is a 77 y.o.right handed male with history of CKD,PVD, CVA maintained on Eliquis as well as recent TIA June 2019 from occluded left vertebral, no bleed, hypertension, history of DVT/PE, diabetes mellitus, peripheral vascular disease with left SFA stenting.Per chart review and patient, patient lives with spouse. Ambulated with a cane and/or rolling walker prior to admission. Split level home. Presented 12/23/2017 with ischemic right lower extremity. Numbness and pain to the right foot 1 week. ABIs completed suggestive of distal right SFA popliteal occlusion. Patient underwent extensive right lower extremity revascularization attempts with recannulization with angioplasty and stent placement 12/23/2017 per Dr. Carlis Abbott. Hospital course pain management. Initially placed on IV heparin with Eliquis resumed in addition to Plavix. Therapy evaluations completed with recommendations of physical medicine rehabilitation consult.   Review of Systems  Constitutional: Positive for malaise/fatigue.  HENT: Negative for hearing loss.   Eyes: Negative for blurred vision and double vision.  Respiratory: Negative for cough and shortness of breath.   Cardiovascular: Positive for leg swelling. Negative for chest pain and palpitations.  Gastrointestinal: Positive for constipation. Negative for nausea and vomiting.       GERD  Genitourinary: Positive for urgency. Negative for dysuria, flank pain and hematuria.  Musculoskeletal: Positive for back pain.  Skin: Negative for rash.  Neurological: Positive for sensory change and weakness.  All other systems reviewed and are negative.  Past Medical History:  Diagnosis Date  . Bruises easily    d/t being on Eliquis  . Carotid artery disease ([place])    a. Duplex 05/2014: 62-83% RICA, 6-62% LICA, elevated velocities in  right subclavian artery, normal left subclavian artery..  . Chronic back pain    HNP  . Chronic kidney disease (CKD)   . Claustrophobia    takes Ativan as needed  . 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 Coronary atherosclerosis of unspecified type of vessel, native or graft    a. cath in 2003 showed 10-20% LM, scattered 20% prox-mid LAD, 30% more distal LAD, 50-60% stenosis of LAD towards apex, 20% Cx, 30% dRCA, focal 95% stenosis in smaller of 2 branches of PDA, EF 60-65%.  . Diabetes mellitus ([place])   . Diarrhea    takes Imodium daily as needed  . Diverticulosis   . Diverticulosis of colon (without mention of hemorrhage)   . DJD (degenerative joint disease)   . DVT (deep venous thrombosis) ([place]) 07/2011  . Esophageal stricture   . Essential hypertension    takes Imdur and Lisinopril daily  . Gastric polyps    benign  . GERD (gastroesophageal reflux disease)    takes Nexium daily  . Glaucoma    uses eye drops  . History of bronchitis    not sure when the last time  . History of colon polyps    benign  . History of kidney stones   . History of kidney stones   . Insomnia    takes Melatonin nightly as needed  . Mixed hyperlipidemia    takes Crestor daily  . Myasthenia gravis ([place])    uses prednisone  . Osteoporosis 2016  . Peripheral edema    takes Lasix daily  . Pituitary tumor    checks Dr[facility]Walden at [facility] checks it every Jan   . Pneumonia 01/2016  . PONV (postoperative nausea and vomiting)   .  Ptosis   . Pulmonary embolus ([place]) 2012  . Stroke [facility]) 06/2015   Past Surgical History:  Procedure Laterality Date  . ABDOMINAL AORTOGRAM W/LOWER EXTREMITY N/A 11/18/2016   Procedure: ABDOMINAL AORTOGRAM W/LOWER EXTREMITY;  Surgeon: [name], MD;  Location: [place] CV LAB;  Service: Cardiovascular;  Laterality: N/A;  . BACK SURGERY     x2, lumbar and cervical  . CARDIAC CATHETERIZATION    . CATARACT EXTRACTION Bilateral 12/12  .  COLONOSCOPY    . ESOPHAGOGASTRODUODENOSCOPY ENDOSCOPY  04/2015  . IR GENERIC HISTORICAL  03/03/2016   IR IVC FILTER PLMT / S&I [name] GUID/MOD SED 03/03/2016 [name], MD [facility]INTER[facility] RAD  . IR GENERIC HISTORICAL  04/09/2016   IR RADIOLOGIST EVAL & MGMT 04/09/2016 [name], MD [facility] INTER[facility] RAD  . IR GENERIC HISTORICAL  04/30/2016   IR IVC FILTER RETRIEVAL / S&I [name] GUID/MOD SED 04/30/2016 [name], MD [facility]-INTERV RAD  . LITHOTRIPSY    . LUMBAR LAMINECTOMY/DECOMPRESSION MICRODISCECTOMY Right 09/14/2012   Procedure: Right Lumbar three-four, four-five, Lumbar five-Sacral one decompressive laminectomy;  Surgeon: [name], MD;  Location: [place] NEURO ORS;  Service: Neurosurgery;  Laterality: Right;  . LUMBAR LAMINECTOMY/DECOMPRESSION MICRODISCECTOMY Left 03/11/2016   Procedure: Left Lumbar Two-ThreeMicrodiscectomy;  Surgeon: [name], MD;  Location: [place];  Service: Neurosurgery;  Laterality: Left;  . PERIPHERAL VASCULAR INTERVENTION  11/18/2016   Procedure: PERIPHERAL VASCULAR INTERVENTION;  Surgeon: [name], MD;  Location: [place] CV LAB;  Service: Cardiovascular;;  Left popliteal  . SHOULDER SURGERY Left 05/07/2009  . TONSILLECTOMY     Family History  Problem Relation Age of Onset  . Heart disease Mother   . Heart attack Mother   . Stroke Mother   . Heart disease Father   . Heart attack Father   . Stroke Father   . Breast cancer Sister        Twin   . Heart attack Sister   . Dementia Sister   . Heart disease Sister   . Heart attack Sister   . Clotting disorder Sister   . Heart attack Sister   . Colon cancer Neg Hx    Social History:  reports that he has never smoked. He has never used smokeless tobacco. He reports that he does not drink alcohol or use drugs. Allergies:  Allergies  Allergen Reactions  . Phenergan [Promethazine Hcl] Swelling and Other (See Comments)    Cannot be combined with Zofran because swelling of the eyes resulted and patient became very  restless-  . Zofran [Ondansetron Hcl] Swelling and Other (See Comments)    Cannot be combined with Phenergan because swelling of the eyes resulted and patient became very restless-  . Other Other (See Comments)    Regarding scans, the PATIENT IS CLAUSTROPHOBIC!!!  . Sulfamethoxazole Rash and Itching   Medications Prior to Admission  Medication Sig Dispense Refill  . brimonidine-timolol (COMBIGAN) 0.2-0.5 % ophthalmic solution Place 1 drop into both eyes 2 (two) times daily.     . cholecalciferol (VITAMIN D) 1000 units tablet Take 1,000 Units by mouth daily.     Marland Kitchen ELIQUIS 5 MG TABS tablet TAKE 1 TABLET (5 MG TOTAL) BY MOUTH 2 (TWO) TIMES DAILY. 60 tablet 3  . furosemide (LASIX) 40 MG tablet Take 1 tablet (40 mg total) by mouth daily. 90 tablet 3  . imatinib (GLEEVEC) 100 MG tablet Take 300 mg by mouth daily with lunch. Take with noon meal and a large glass of  water.Caution:Chemotherapy    . isosorbide mononitrate (IMDUR) 30 MG 24 hr tablet Take 1 tablet (30 mg total) by mouth daily. 90 tablet 3  . lidocaine (LIDODERM) 5 % Place 1 patch onto the skin daily as needed (for back pain). Remove & Discard patch within 12 hours or as directed by MD     . lisinopril (PRINIVIL,ZESTRIL) 10 MG tablet Take 1 tablet (10 mg total) by mouth daily. 30 tablet 11  . loperamide (IMODIUM) 2 MG capsule Take 2-4 mg by mouth 3 (three) times daily as needed for diarrhea or loose stools.     . Multiple Vitamin (MULTIVITAMIN) tablet Take 1 tablet by mouth daily.    Marland Kitchen oxyCODONE (OXY IR/ROXICODONE) 5 MG immediate release tablet Take 1 tablet (5 mg total) by mouth every 6 (six) hours as needed for moderate pain or severe pain. 20 tablet 0  . potassium chloride (K-DUR) 10 MEQ tablet Take 1 tablet (10 mEq total) by mouth daily. 90 tablet 3  . predniSONE (DELTASONE) 1 MG tablet Take 3 tablets (3 mg total) by mouth daily with breakfast. 270 tablet 1  . pyridostigmine (MESTINON) 60 MG tablet Take 60 mg by mouth daily.    .  rosuvastatin (CRESTOR) 10 MG tablet Take 5 mg by mouth daily.    Marland Kitchen oxyCODONE-acetaminophen (PERCOCET/ROXICET) 5-325 MG tablet Take 1 tablet by mouth every 6 (six) hours as needed.  0    Home: Home Living Family/patient expects to be discharged to:: Private residence Living Arrangements: Spouse/significant other Available Help at Discharge: Family, Available 24 hours/day Type of Home: House Home Access: Stairs to enter [employer] of Steps: 3 from [place], then 9 to first floor  Entrance Stairs-Rails: Left Home Layout: (split level - must go up stairs ) Bathroom Shower/Tub: Multimedia programmer: Handicapped height Bathroom Accessibility: Yes Home Equipment: [place] in, Environmental consultant - 2 wheels, [place] - single point Additional Comments: Walk-in shower with built in bench  Functional History: Prior Function Level of Independence: Needs assistance Gait / Transfers Assistance Needed: Pt amb with cane or rolling walker modified independent ADL's / Homemaking Assistance Needed: spouse reports patient sometimes needing assistance for LB dressing  Comments: Enjoys spending time with family Functional Status:  Mobility: Bed Mobility Overal bed mobility: Needs Assistance Bed Mobility: Supine to Sit, Sit to Supine Supine to sit: Min guard Sit to supine: Min guard General bed mobility comments: ablet o bring LE and trunk to EOB without physical assit - increased time and effort Transfers Overall transfer level: Needs assistance Equipment used: Rolling walker (2 wheeled) Transfers: Sit to/from Stand Sit to Stand: Min assist, From elevated surface Stand pivot transfers: Min assist, +2 safety/equipment General transfer comment: Min A to power up at bedside after multiple attempts - cueing for hand placement for safety and efficiency Ambulation/Gait Ambulation/Gait assistance: Min assist Gait Distance (Feet): (hallway ambulation) Assistive device: Rolling walker (2  wheeled) Gait Pattern/deviations: Step-through pattern, Decreased stride length, Drifts right/left, Trunk flexed General Gait Details: use of RW with gait - Min A due to ankle instability due to being "numb" - cueing for safety and sequecning with device Gait velocity: decreased Gait velocity interpretation: <1.31 ft/sec, indicative of household ambulator    ADL:    Cognition: Cognition Overall Cognitive Status: Within Functional Limits for tasks assessed Orientation Level: Oriented X4 Cognition Arousal/Alertness: Awake/alert Behavior During Therapy: WFL for tasks assessed/performed Overall Cognitive Status: Within Functional Limits for tasks assessed  Blood pressure (!) 121/56, pulse 82, temperature  97.9 F (36.6 C), temperature source Oral, resp. rate 18, height 5\' 7"  (1.702 m), weight 77.1 kg, SpO2 99 %. Physical Exam  Vitals reviewed. Constitutional: He appears well-developed and well-nourished.  HENT:  Head: Normocephalic and atraumatic.  Eyes: EOM are normal. Right eye exhibits no discharge. Left eye exhibits no discharge.  Neck: Normal range of motion. Neck supple. No thyromegaly present.  Cardiovascular: Normal rate and regular rhythm.  Respiratory: Effort normal and breath sounds normal. No respiratory distress.  GI: Soft. Bowel sounds are normal. He exhibits no distension.  Musculoskeletal:  LLE edema  Neurological: He is alert.  Patient is alert.  Follows full commands.  Provides his name and age. Motor: Bilateral upper extremities: 4+/5 proximal distal Bilateral lower extremity: 4-/5 proximal distal (left side weaker than right) HOH  Skin:  Groin incision clean and dry  Psychiatric: He has a normal mood and affect. His behavior is normal.    Results for orders placed or performed during the hospital encounter of 12/23/17 (from the past 24 hour(s))  CBC     Status: Abnormal   Collection Time: 12/27/17  3:29 AM  Result Value Ref Range   WBC 8.0 4.0 - 10.5  K/uL   RBC 3.17 (L) 4.22 - 5.81 MIL/uL   Hemoglobin 9.7 (L) 13.0 - 17.0 g/dL   HCT 29.5 (L) 39.0 - 52.0 %   MCV 93.1 80.0 - 100.0 fL   MCH 30.6 26.0 - 34.0 pg   MCHC 32.9 30.0 - 36.0 g/dL   RDW 17.5 (H) 11.5 - 15.5 %   Platelets 152 150 - 400 K/uL   nRBC 0.0 0.0 - 0.2 %  Basic metabolic panel     Status: Abnormal   Collection Time: 12/27/17  3:29 AM  Result Value Ref Range   Sodium 135 135 - 145 mmol/L   Potassium 4.4 3.5 - 5.1 mmol/L   Chloride 106 98 - 111 mmol/L   CO2 21 (L) 22 - 32 mmol/L   Glucose, Bld 110 (H) 70 - 99 mg/dL   BUN 21 8 - 23 mg/dL   Creatinine, Ser 2.17 (H) 0.61 - 1.24 mg/dL   Calcium 8.1 (L) 8.9 - 10.3 mg/dL   GFR calc non Af Amer 27 (L) >60 mL/min   GFR calc Af Amer 32 (L) >60 mL/min   Anion gap 8 5 - 15   *Note: Due to a large number of results and/or encounters for the requested time period, some results have not been displayed. A complete set of results can be found in Results Review.   No results found.  Assessment/Plan: Diagnosis: Debility Labs independently reviewed.  Records reviewed and summated above.  1. Does the need for close, 24 hr/day medical supervision in concert with the patient's rehab needs make it unreasonable for this patient to be served in a less intensive setting? Yes  2. Co-Morbidities requiring supervision/potential complications: CKD (avoid nephrotoxic meds),PVD, CVA, HTN (monitor and provide prns in accordance with increased physical exertion and pain), history of DVT/PE, DM (Monitor in accordance with exercise and adjust meds as necessary), peripheral vascular disease with left SFA stenting, post-op pain management (Biofeedback training with therapies to help reduce reliance on opiate pain medications, monitor pain control during therapies, and sedation at rest and titrate to maximum efficacy to ensure participation and gains in therapies) 3. Due to bowel management, safety, skin/wound care, disease management, pain management and  patient education, does the patient require 24 hr/day rehab nursing? Yes 4. Does the  patient require coordinated care of a physician, rehab nurse, PT (1-2 hrs/day, 5 days/week) and OT (1-2 hrs/day, 5 days/week) to address physical and functional deficits in the context of the above medical diagnosis(es)? Yes Addressing deficits in the following areas: balance, endurance, locomotion, strength, transferring, bathing, dressing, toileting and psychosocial support 5. Can the patient actively participate in an intensive therapy program of at least 3 hrs of therapy per day at least 5 days per week? Potentially 6. The potential for patient to make measurable gains while on inpatient rehab is excellent 7. Anticipated functional outcomes upon discharge from inpatient rehab are supervision and min assist  with PT, supervision and min assist with OT, n/a with SLP. 8. Estimated rehab length of stay to reach the above functional goals is: 13-17 days. 9. Anticipated D/C setting: Home 10. Anticipated post D/C treatments: HH therapy and Home excercise program 11. Overall Rehab/Functional Prognosis: good  RECOMMENDATIONS: This patient's condition is appropriate for continued rehabilitative care in the following setting: CIR if patient has adequate caregiver support to enable navigation of 12 stairs. Patient has agreed to participate in recommended program. Potentially, prefers to go home. Note that insurance prior authorization may be required for reimbursement for recommended care.  Comment: Rehab Admissions Coordinator to follow up.   I have personally performed a face to face diagnostic evaluation, including, but not limited to relevant history and physical exam findings, of this patient and developed relevant assessment and plan.  Additionally, I have reviewed and concur with the physician assistant's documentation above.   Delice Lesch, MD, ABPMR Lavon Paganini Angiulli, PA-C 12/27/2017

## 2017-12-27 NOTE — Evaluation (Signed)
Physical Therapy Evaluation Patient Details Name: [name] MRN: [id number] DOB: [birth year] Today's Date: 12/27/2017   History of Present Illness  Pt adm with acute limb ischemia of right lower extremity. Pt underwent extensive RLE revascularization attempts with recanalization with angioplasty and stent placement on 11/14. Pt has no other options for revascularization and is at high risk for limb loss. 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 guided aspiration of left SCM on 11/03/17, CKD, PVD. Initial PT eval on 11/15 with PT reordered on this date.     Clinical Impression  [name] admitted with the above listed diagnosis. Patient reports Mod I with mobility prior to admission. Patient today requiring Min A for hallway ambulation with RW with patient limited by L groin pain and R foot numbness with tendency for ankle to roll requiring physical assist for steadying. Patient also will require navigating 12 steps to enter home. On this date patient unable to perform even with Max A from PT. Will recommend CIR at discharge to progress safe and functional mobility for safe return home.     Follow Up Recommendations Other (comment)(post-acute rehab)    Equipment Recommendations  None recommended by PT    Recommendations for Other Services Rehab consult     Precautions / Restrictions Precautions Precautions: Fall Restrictions Weight Bearing Restrictions: No      Mobility  Bed Mobility Overal bed mobility: Needs Assistance Bed Mobility: Supine to Sit;Sit to Supine     Supine to sit: Min guard Sit to supine: Min guard   General bed mobility comments: ablet o bring LE and trunk to EOB without physical assit - increased time and effort  Transfers Overall transfer level: Needs assistance Equipment used: Rolling  walker (2 wheeled) Transfers: Sit to/from Stand Sit to Stand: Min assist;From elevated surface         General transfer comment: Min A to power up at bedside after multiple attempts - cueing for hand placement for safety and efficiency  Ambulation/Gait Ambulation/Gait assistance: Min assist Gait Distance (Feet): (hallway ambulation) Assistive device: Rolling walker (2 wheeled) Gait Pattern/deviations: Step-through pattern;Decreased stride length;Drifts right/left;Trunk flexed Gait velocity: decreased   General Gait Details: use of RW with gait - Min A due to ankle instability due to being "numb" - cueing for safety and sequecning with device  Stairs            Wheelchair Mobility    Modified Rankin (Stroke Patients Only)       Balance Overall balance assessment: Needs assistance Sitting-balance support: No upper extremity supported;Feet supported Sitting balance-Leahy Scale: Good     Standing balance support: Bilateral upper extremity supported;During functional activity Standing balance-Leahy Scale: Poor Standing balance comment: reliant on RW and external support                             Pertinent Vitals/Pain Pain Assessment: Faces Faces Pain Scale: Hurts whole lot Pain Location: left groin Pain Descriptors / Indicators: Guarding;Grimacing Pain Intervention(s): Limited activity within patient's tolerance;Monitored during session;Repositioned    Home Living Family/patient expects to be discharged to:: Private residence Living Arrangements: Spouse/significant other Available Help at Discharge: Family;Available 24 hours/day Type of Home: House Home Access: Stairs to enter Entrance Stairs-Rails: Left Entrance Stairs-Number of Steps: 3 from porch, then 9 to first floor  Home Layout: (split  level - must go up stairs ) Home Equipment: Shower seat - built in;Walker - 2 wheels;Cane - single point Additional Comments: Walk-in shower with built in  bench    Prior Function Level of Independence: Needs assistance   Gait / Transfers Assistance Needed: Pt amb with cane or rolling walker modified independent  ADL's / Homemaking Assistance Needed: spouse reports patient sometimes needing assistance for LB dressing   Comments: Enjoys spending time with family     Hand Dominance   Dominant Hand: Right    Extremity/Trunk Assessment        Lower Extremity Assessment Lower Extremity Assessment: Generalized weakness    Cervical / Trunk Assessment Cervical / Trunk Assessment: Normal  Communication   Communication: HOH  Cognition Arousal/Alertness: Awake/alert Behavior During Therapy: WFL for tasks assessed/performed Overall Cognitive Status: Within Functional Limits for tasks assessed                                        General Comments      Exercises     Assessment/Plan    PT Assessment Patient needs continued PT services  PT Problem List Decreased strength;Decreased activity tolerance;Decreased balance;Decreased mobility;Pain;Decreased safety awareness       PT Treatment Interventions DME instruction;Gait training;Stair training;Functional mobility training;Therapeutic activities;Therapeutic exercise;Balance training;Patient/family education    PT Goals (Current goals can be found in the Care Plan section)  Acute Rehab PT Goals Patient Stated Goal: return home safely PT Goal Formulation: With patient Time For Goal Achievement: 01/10/18 Potential to Achieve Goals: Good    Frequency Min 3X/week   Barriers to discharge Inaccessible home environment 12 steps to enter home    Co-evaluation               AM-PAC PT "6 Clicks" Daily Activity  Outcome Measure Difficulty turning over in bed (including adjusting bedclothes, sheets and blankets)?: A Lot Difficulty moving from lying on back to sitting on the side of the bed? : A Lot Difficulty sitting down on and standing up from a chair with  arms (e.g., wheelchair, bedside commode, etc,.)?: Unable Help needed moving to and from a bed to chair (including a wheelchair)?: A Little Help needed walking in hospital room?: A Little Help needed climbing 3-5 steps with a railing? : Total 6 Click Score: 12    End of Session Equipment Utilized During Treatment: Gait belt Activity Tolerance: Patient tolerated treatment well;Patient limited by pain Patient left: in bed;with call bell/phone within reach;with family/visitor present Nurse Communication: Mobility status PT Visit Diagnosis: Other abnormalities of gait and mobility (R26.89);Difficulty in walking, not elsewhere classified (R26.2);Pain Pain - Right/Left: Left Pain - part of body: Leg    Time: 7408-1448 PT Time Calculation (min) (ACUTE ONLY): 25 min   Charges:   PT Evaluation $PT Eval Moderate Complexity: 1 Mod PT Treatments $Gait Training: 8-22 mins       Lanney Gins, PT, DPT Supplemental Physical Therapist 12/27/17 12:06 PM Pager: [phone] Office: [phone]

## 2017-12-27 NOTE — Progress Notes (Signed)
Patient c/o constipation, verbal order received from Laurence Slate PA, for Lactulose 10g PO.

## 2017-12-27 NOTE — Progress Notes (Signed)
Pt ambulated about 150 feet in hall with walker

## 2017-12-27 NOTE — Plan of Care (Signed)
  Problem: Education: Goal: Knowledge of General Education information will improve Description: Including pain rating scale, medication(s)/side effects and non-pharmacologic comfort measures Outcome: Progressing   Problem: Clinical Measurements: Goal: Ability to maintain clinical measurements within normal limits will improve Outcome: Progressing   Problem: Activity: Goal: Risk for activity intolerance will decrease Outcome: Progressing   Problem: Pain Managment: Goal: General experience of comfort will improve Outcome: Progressing   Problem: Skin Integrity: Goal: Risk for impaired skin integrity will decrease Outcome: Progressing   

## 2017-12-27 NOTE — Progress Notes (Addendum)
[facility] [place]  Subjective  - Doing well, concerned about ambulating due to numbness in the right foot.   Objective 128/78 75 98.2 F (36.8 C) (Oral) 13 98%  Intake/Output Summary (Last 24 hours) at 12/27/2017 0734 Last data filed at 12/27/2017 0700 Gross per 24 hour  Intake 1230 ml  Output 705 ml  Net 525 ml    Doppler right LE peroneal/PT/DP Left groin incision healing well, JP removed yesterday Heart A fib Lungs non labored breathing  Assessment/Planning: POD #  Procedure: 1. US guided access of the left common femoral artery 2.  Aortogram 3.  Right lower extremity arteriogram with selection of third order branches 4.  Right superficial femoral artery recanalization with angioplasty and stent placement (pre-dilated with 4 mm angioplasty balloon, 6 mm x 120 mm Innova x2, post-dilated with 5 mm angioplasty balloon) 5.  Right above-knee and below-knee popliteal artery recanalization with angioplasty and stent placement (pre-dilated 3 mm angioplasty balloon, 5 mm x 100 mm Tigris stent, post-dilated with 5 mm balloon) 6.  Cutdown on the left common femoral artery with primary repair Well perfused right LE with 3 doppler signals On Eliquis and Plavix PT for ambulation and safety.  Reordered PT he has not been seen since 12/24/2017. Pending D/C once safe  Roxy Horseman 12/27/2017 7:34 AM --   Small hematoma left groin with some skin erythema continue to observe Hemoglobin up appropriate with transfusion Doppler signals right foot Creatinine stable around 2 ACE stopped yesterday  D/c home tomorrow if clears PT also Rehab being considered  Ruta Hinds, MD [facility] [place]: [phone] Pager: [phone]  Laboratory Lab Results: Recent Labs    12/26/17 0340 12/27/17 0329  WBC 9.6 8.0  HGB 6.4* 9.7*  HCT 21.0* 29.5*  PLT 147* 152   BMET Recent Labs    12/26/17 0340 12/27/17 0329   NA 138 135  K 4.3 4.4  CL 108 106  CO2 21* 21*  GLUCOSE 120* 110*  BUN 21 21  CREATININE 2.38* 2.17*  CALCIUM 7.9* 8.1*    COAG Lab Results  Component Value Date   INR 1.42 12/23/2017   INR 1.25 07/19/2017   INR 1.1 11/10/2016   No results found for: PTT

## 2017-12-28 ENCOUNTER — Encounter (HOSPITAL_COMMUNITY): Payer: Self-pay | Admitting: Vascular Surgery

## 2017-12-28 NOTE — Progress Notes (Addendum)
[facility] [place]  Subjective  - Doing better each day.   Objective 129/64 80 98.1 F (36.7 C) (Oral) 11 99%  Intake/Output Summary (Last 24 hours) at 12/28/2017 0735 Last data filed at 12/28/2017 0426 Gross per 24 hour  Intake 485 ml  Output 976 ml  Net -491 ml    Doppler signal Peroneal> DP/PT Left groin soft without hematoma, incision healing well Heart irregularly irregular, baseline Lungs non labored breathing  Assessment/Planning: Procedure: 1. US guided access of the left common femoral artery 2. Aortogram 3. Right lower extremity arteriogram with selection of third order branches 4. Right superficial femoral artery recanalization with angioplasty and stent placement (pre-dilated with 4 mm angioplasty balloon,6 mm x 120 mmInnova x2, post-dilated with 5 mm angioplasty balloon) 5. Right above-knee and below-knee popliteal artery recanalization with angioplasty and stent placement (pre-dilated 3 mm angioplasty balloon,5 mm x 100 mm Tigris stent, post-dilated with 5 mm balloon) 6. Cutdown on the left common femoral artery with primary repair  HGB 9.7 after 4 units of PRBC Pending CIR verses home On Eliquis and Plavix    Roxy Horseman 12/28/2017 7:35 AM --  Laboratory Lab Results: Recent Labs    12/26/17 0340 12/27/17 0329  WBC 9.6 8.0  HGB 6.4* 9.7*  HCT 21.0* 29.5*  PLT 147* 152   BMET Recent Labs    12/26/17 0340 12/27/17 0329  NA 138 135  K 4.3 4.4  CL 108 106  CO2 21* 21*  GLUCOSE 120* 110*  BUN 21 21  CREATININE 2.38* 2.17*  CALCIUM 7.9* 8.1*    COAG Lab Results  Component Value Date   INR 1.42 12/23/2017   INR 1.25 07/19/2017   INR 1.1 11/10/2016   No results found for: PTT   I have seen and evaluated the patient. I agree with the PA note as documented above. Great right peroneal signal and R DP/PT monophasic signals.  Foot viable.  Wants to go home even after rehab eval yesterday.   Will have him work with PT this am and make decision about rehab vs home.  Needs Eliquis and Plavix continued at discharge.   [name], MD [facility] [place] Office: [phone] Pager: [phone]

## 2017-12-28 NOTE — Care Management Important Message (Signed)
Important Message  Patient Details  Name: MACY LINGENFELTER MRN: [id number] Date of Birth: 21-Jan-1941   Medicare Important Message Given:  Yes  Patient did not sign due to illness.  Derrek Puff 12/28/2017, 1:58 PM

## 2017-12-28 NOTE — Evaluation (Signed)
Physical Therapy Evaluation Patient Details Name: [name] MRN: [id number] DOB: [birth year] Today's Date: 12/28/2017   History of Present Illness  Pt adm with acute limb ischemia of right lower extremity. Pt underwent extensive RLE revascularization attempts with recanalization with angioplasty and stent placement on 11/14. Pt has no other options for revascularization and is at high risk for limb loss. 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 guided aspiration of left SCM on 11/03/17, CKD, PVD. Initial PT eval on 11/15 with PT reordered on this date.   Clinical Impression  Pt making excellent progress with mobility. Able to negotiate flight of stairs with only min guard assist.    Follow Up Recommendations Home health PT;Supervision - Intermittent    Equipment Recommendations  None recommended by PT    Recommendations for Other Services       Precautions / Restrictions Precautions Precautions: Fall Restrictions Weight Bearing Restrictions: No      Mobility  Bed Mobility Overal bed mobility: Modified Independent Bed Mobility: Supine to Sit     Supine to sit: Modified independent (Device/Increase time)     General bed mobility comments: Incr time  Transfers Overall transfer level: Needs assistance Equipment used: Rolling walker (2 wheeled) Transfers: Sit to/from Stand Sit to Stand: Supervision         General transfer comment: supervision for safety  Ambulation/Gait Ambulation/Gait assistance: Supervision Gait Distance (Feet): 150 Feet(x ) Assistive device: Rolling walker (2 wheeled) Gait Pattern/deviations: Step-through pattern;Decreased stride length;Trunk flexed Gait velocity: decreased Gait velocity interpretation: 1.31 - 2.62 ft/sec, indicative of limited community ambulator General Gait  Details: supervision for safety  Stairs Stairs: Yes Stairs assistance: Min guard;Min assist Stair Management: One rail Left;Step to pattern;Sideways;Forwards Number of Stairs: 10 General stair comments: Initially pt attempted to go up leading with left foot. Needed min assist to go up/down 3. Next attempt led up stairs with rt foot and was much easier and pt went up/down 10 stairs with only min guard for safety. Used both hands on rail to go up/down.  Wheelchair Mobility    Modified Rankin (Stroke Patients Only)       Balance Overall balance assessment: Needs assistance Sitting-balance support: No upper extremity supported;Feet supported Sitting balance-Leahy Scale: Good     Standing balance support: Bilateral upper extremity supported;During functional activity Standing balance-Leahy Scale: Poor Standing balance comment: rolling walker and supervision for static standing                             Pertinent Vitals/Pain Pain Assessment: Faces Faces Pain Scale: Hurts little more Pain Location: left groin Pain Descriptors / Indicators: Guarding;Grimacing Pain Intervention(s): Limited activity within patient's tolerance    Home Living                        Prior Function                 Hand Dominance        Extremity/Trunk Assessment                Communication      Cognition Arousal/Alertness: Awake/alert Behavior During Therapy: WFL for tasks assessed/performed Overall Cognitive Status: Within Functional Limits for tasks assessed  General Comments      Exercises     Assessment/Plan    PT Assessment    PT Problem List         PT Treatment Interventions      PT Goals (Current goals can be found in the Care Plan section)  Acute Rehab PT Goals PT Goal Formulation: With patient Time For Goal Achievement: 01/04/18 Potential to Achieve Goals: Good     Frequency Min 3X/week   Barriers to discharge        Co-evaluation               AM-PAC PT "6 Clicks" Daily Activity  Outcome Measure Difficulty turning over in bed (including adjusting bedclothes, sheets and blankets)?: A Little Difficulty moving from lying on back to sitting on the side of the bed? : A Little Difficulty sitting down on and standing up from a chair with arms (e.g., wheelchair, bedside commode, etc,.)?: A Little Help needed moving to and from a bed to chair (including a wheelchair)?: A Little Help needed walking in hospital room?: A Little Help needed climbing 3-5 steps with a railing? : A Little 6 Click Score: 18    End of Session Equipment Utilized During Treatment: Gait belt Activity Tolerance: Patient tolerated treatment well Patient left: in bed;with call bell/phone within reach;with family/visitor present(sitting EOB) Nurse Communication: Mobility status PT Visit Diagnosis: Other abnormalities of gait and mobility (R26.89);Pain Pain - Right/Left: Left Pain - part of body: Leg    Time: 5188-4166 PT Time Calculation (min) (ACUTE ONLY): 14 min   Charges:     PT Treatments $Gait Training: 8-22 mins       [place] Pager [phone] Office [place] 12/28/2017, 11:24 AM

## 2017-12-28 NOTE — Care Management Note (Signed)
Case Management Note Marvetta Gibbons RN, BSN Transitions of Care Unit 4E- RN Case Manager [phone]  Patient Details  Name: [name] MRN: [id number] Date of Birth: 1940/05/15  Subjective/Objective:  Pt admitted s/p aortogram with angioplasty balloon stenting                   Action/Plan: PTA pt lived at home with wife,CIR consulted for possible admission, however pt doing will with ambulation and stairs and desires to return home with wife and [facility] services- have requested order for HHPT. Spoke with pt and wife at bedside regarding [facility]- per wife and pt they have used [facility] in past (just finished coarse of home IV abx)- would like to use [facility] again for [facility] needs- referral called to [place] with [facility]- for HHPT- referral accepted- no DME needs noted- pt to transition home with wife and [facility] services with [facility].   Expected Discharge Date:  12/28/17               Expected Discharge Plan:  [place]  In-House Referral:  NA  Discharge planning Services  CM Consult  Post Acute Care Choice:  Home Health Choice offered to:  Patient  DME Arranged:    DME Agency:     HH Arranged:  PT [place]:  [facility] (formerly [employer])  Status of Service:  Completed, signed off  If discussed at [employer] of Stay Meetings, dates discussed:    Discharge Disposition: home/home health   Additional Comments:  Dawayne Patricia, RN 12/28/2017, 4:00 PM

## 2017-12-28 NOTE — Progress Notes (Signed)
Inpatient Rehabilitation Admissions Coordinator  I met with patient and his wife at bedside. I am familiar with patient from previous CIR admit. Has had 2 CIR admits. 06/2015 and 07/2017. He would like to work with therapy this morning on stairs and further discuss with therapy today before making his decision on CIR admit vs d/c home. Wife is leaning towards CIR admit. I will contact physical therapy , [place], to make her aware of need this morning.  Danne Baxter, RN, MSN Rehab Admissions Coordinator [phone] 12/28/2017 10:15 AM

## 2017-12-28 NOTE — Progress Notes (Signed)
Inpatient Rehabilitation Admissions Coordinator  I met with pt and wife after his therapy session. He has improved with functional mobility and there is no need for an inpt rehab admit at this level. We recommend home health and pt and wife are in agreement. We will sign off at this time. I have informed RN CM.  Danne Baxter, RN, MSN Rehab Admissions Coordinator [phone] 12/28/2017 12:24 PM

## 2017-12-28 NOTE — Progress Notes (Signed)
PT provided with discharge instructions. Wife at bedside. Pt iv removed and intact. Pt denies any complaints. PT has all belongings including cell phone and glasses. Tele box removed/CCMD notified. Pt tx via wheelchair to ED to meet wife. Jerald Kief

## 2017-12-28 NOTE — Discharge Instructions (Signed)
   [facility]    Discharge Instructions  Endovascular Aortic Aneurysm Repair  Please refer to the following instructions for your post-procedure care. Your surgeon or Physician Assistant will discuss any changes with you.  Activity  You are encouraged to walk as much as you can. You can slowly return to normal activities but must avoid strenuous activity and heavy lifting until your doctor tells you it's OK. Avoid activities such as vacuuming or swinging a gold club. It is normal to feel tired for several weeks after your surgery. Do not drive until your doctor gives the OK and you are no longer taking prescription pain medications. It is also normal to have difficulty with sleep habits, eating, and bowel movements after surgery. These will go away with time.  Bathing/Showering  You may shower after you go home. If you have an incision, do not soak in a bathtub, hot tub, or swim until the incision heals completely.  Incision Care  Shower every day. Clean your incision with mild soap and water. Pat the area dry with a clean towel. You do not need a bandage unless otherwise instructed. Do not apply any ointments or creams to your incision. If you clothing is irritating, you may cover your incision with a dry gauze pad.  Diet  Resume your normal diet. There are no special food restrictions following this procedure. A low fat/low cholesterol diet is recommended for all patients with vascular disease. In order to heal from your surgery, it is CRITICAL to get adequate nutrition. Your body requires vitamins, minerals, and protein. Vegetables are the best source of vitamins and minerals. Vegetables also provide the perfect balance of protein. Processed food has little nutritional value, so try to avoid this.  Medications  Resume taking all of your medications unless your doctor or nurse practitioner tells you not to. If your incision is causing pain, you may take  over-the-counter pain relievers such as acetaminophen (Tylenol). If you were prescribed a stronger pain medication, please be aware these medications can cause nausea and constipation. Prevent nausea by taking the medication with a snack or meal. Avoid constipation by drinking plenty of fluids and eating foods with a high amount of fiber, such as fruits, vegetables, and grains. Do not take Tylenol if you are taking prescription pain medications.   Follow up  Our office will schedule a follow-up appointment with a C.T. scan 3-4 weeks after your surgery.  Please call us immediately for any of the following conditions  Severe or worsening pain in your legs or feet or in your abdomen back or chest. Increased pain, redness, drainage (pus) from your incision sit. Increased abdominal pain, bloating, nausea, vomiting or persistent diarrhea. Fever of 101 degrees or higher. Swelling in your leg (s),  Reduce your risk of vascular disease  Stop smoking. If you would like help call [facility] at 1-800-QUIT-NOW (1-800-784-8669) or [place] at [phone]. Manage your cholesterol Maintain a desired weight Control your diabetes Keep your blood pressure down  If you have questions, please call the office at [phone].   

## 2017-12-28 NOTE — Consult Note (Addendum)
   [facility] CM Inpatient Consult   12/28/2017  Webster Groves 04-04-1940 [id number]   Patient screened for potential [facility] Care Management services due to unplanned readmission risk score of 43% and multiple hospitalizations.   Went to bedside to speak with patient and spouse regarding [facility] for transition of care needs when he returns home. Patient declines services at this time.  Provided [facility] Care Management brochure with contact information.    Netta Cedars, MSN, [place] [facility] Liaison Nurse Mobile Phone [phone]  Toll free office [phone]

## 2017-12-29 NOTE — Discharge Summary (Signed)
[facility] Discharge Summary   Patient ID:  [name] MRN: [id number] DOB/AGE: 03-19-1940 77 y.o.  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 Bruises easily    d/t being on Eliquis  . Carotid artery disease ([place])    a. Duplex 05/2014: 09-62% RICA, 8-36% LICA, elevated velocities in right subclavian artery, normal left subclavian artery..  . Chronic back pain    HNP  . Chronic kidney disease (CKD)   . Claustrophobia    takes Ativan as needed  . 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 Coronary atherosclerosis of unspecified type of vessel, native or graft    a. cath in 2003 showed 10-20% LM, scattered 20% prox-mid LAD, 30% more distal LAD, 50-60% stenosis of LAD towards apex, 20% Cx, 30% dRCA, focal 95% stenosis in smaller of 2 branches of PDA, EF 60-65%.  . Diabetes mellitus ([place])   . Diarrhea    takes Imodium daily as needed  . Diverticulosis   . Diverticulosis of colon (without mention of hemorrhage)   . DJD (degenerative joint disease)   . DVT (deep venous thrombosis) ([place]) 07/2011  . Esophageal stricture   . Essential hypertension    takes Imdur and Lisinopril daily  . Gastric polyps    benign  . GERD (gastroesophageal reflux disease)    takes Nexium daily  . Glaucoma    uses eye drops  . History of bronchitis    not sure when the last time  . History of colon polyps    benign  . History of kidney stones   . History of kidney stones   . Insomnia    takes Melatonin nightly as needed  . Mixed hyperlipidemia    takes Crestor daily  . Myasthenia gravis ([place])    uses prednisone  .  Osteoporosis 2016  . Peripheral edema    takes Lasix daily  . Pituitary tumor    checks Dr.Walden at [facility] checks it every Jan   . Pneumonia 01/2016  . PONV (postoperative nausea and vomiting)   . Ptosis   . Pulmonary embolus ([place]) 2012  . Stroke ([place]) 06/2015    Procedure(s): AORTOGRAM, RIGHT LOWER EXTREMITY ANGIOGRAM, RIGHT SUPERFICIAL FEMORAL ARTERY ANGIOPLASTY WITH STENT, RIGHT POPLITEAL ARTERY ANGIOPLASTY WITH STENT, LEFT GROIN CUTDOWN AND LEFT FEMORAL ARTERY REPAIR  Discharged Condition: stable  HPI: This is a 77 y.o. male with hx CKD, CVA on Eliquis (last had TIA in 07/2017 from occluded left vertebral, no bleed), HTN, hx DVT/PE, DM, PVD (s/p L SFA stent) recently admitted left shoulder infection (had PICC line removed last week) that presents to the ED from his cardiology office with concern for acute limb ischemia of right lower extremity.  Patient endorses 1 week of pain and numbness in his right foot.  He presented to his cardiology office today and was seen by one of the nurse practitioners and they could not calculate an ABI in addition on a duplex they felt that he had a distal right SFA popliteal occlusion.  Patient states he is ambulatory.  He last took his dose of Eliquis this morning.  He denies any previous right lower extremity revascularizations.  He previously underwent a right  lower extremity arteriogram in October 2018 by Dr. Fletcher Anon that showed some stenosis in the common femoral artery with some heavily calcified distal SFA and popliteal disease in the 60 to 70% range with three-vessel runoff.  Per Dr. Fletcher Anon the common femoral was open on today's duplex with only slightly increased velocity of 200 with distal SFA/pop occlusion.   Hospital Course:  CLIFF DAMIANI is a 77 y.o. male is S/P  Procedure(s): AORTOGRAM, RIGHT LOWER EXTREMITY ANGIOGRAM, RIGHT SUPERFICIAL FEMORAL ARTERY ANGIOPLASTY WITH STENT, RIGHT POPLITEAL ARTERY ANGIOPLASTY WITH STENT, LEFT GROIN CUTDOWN AND  LEFT FEMORAL ARTERY REPAIR  POD#1 he had AT doppler signal Right foot feels warm to touch, active range of motion of toes and ankle intact. Left groin soft without hematoma.  JP drain 20 cc output He was restarted on his Eliquis and transitioned of Heparin.   After working with PT he was discharged home in stable condition on POD# 4 with his family.  Home health was set up for increased mobility and safety.    Consults:  Treatment Team:  [name], MD  Significant Diagnostic Studies: CBC Lab Results  Component Value Date   WBC 8.0 12/27/2017   HGB 9.7 (L) 12/27/2017   HCT 29.5 (L) 12/27/2017   MCV 93.1 12/27/2017   PLT 152 12/27/2017    BMET    Component Value Date/Time   NA 135 12/27/2017 0329   NA 141 12/23/2017 0931   NA 141 02/26/2012 0852   K 4.4 12/27/2017 0329   K 3.4 (L) 02/26/2012 0852   CL 106 12/27/2017 0329   CL 105 02/26/2012 0852   CO2 21 (L) 12/27/2017 0329   CO2 28 02/26/2012 0852   GLUCOSE 110 (H) 12/27/2017 0329   GLUCOSE 77 02/26/2012 0852   BUN 21 12/27/2017 0329   BUN 19 12/23/2017 0931   BUN 13.0 02/26/2012 0852   CREATININE 2.17 (H) 12/27/2017 0329   CREATININE 1.33 05/11/2013 1246   CREATININE 1.3 02/26/2012 0852   CALCIUM 8.1 (L) 12/27/2017 0329   CALCIUM 9.1 02/26/2012 0852   GFRNONAA 27 (L) 12/27/2017 0329   GFRAA 32 (L) 12/27/2017 0329   COAG Lab Results  Component Value Date   INR 1.42 12/23/2017   INR 1.25 07/19/2017   INR 1.1 11/10/2016     Disposition:  Discharge to :Home Discharge Instructions    Call MD for:  redness, tenderness, or signs of infection (pain, swelling, bleeding, redness, odor or green/yellow discharge around incision site)   Complete by:  As directed    Call MD for:  severe or increased pain, loss or decreased feeling  in affected limb(s)   Complete by:  As directed    Call MD for:  temperature >100.5   Complete by:  As directed    Resume previous diet   Complete by:  As directed       Allergies as of 12/28/2017      Reactions   Phenergan [promethazine Hcl] Swelling, Other (See Comments)   Cannot be combined with Zofran because swelling of the eyes resulted and patient became very restless-   Zofran [ondansetron Hcl] Swelling, Other (See Comments)   Cannot be combined with Phenergan because swelling of the eyes resulted and patient became very restless-   Other Other (See Comments)   Regarding scans, the PATIENT IS CLAUSTROPHOBIC!!!   Sulfamethoxazole Rash, Itching      Medication List    TAKE these medications   cholecalciferol 1000 units tablet Commonly known  as:  VITAMIN D Take 1,000 Units by mouth daily.   COMBIGAN 0.2-0.5 % ophthalmic solution Generic drug:  brimonidine-timolol Place 1 drop into both eyes 2 (two) times daily.   ELIQUIS 5 MG Tabs tablet Generic drug:  apixaban TAKE 1 TABLET (5 MG TOTAL) BY MOUTH 2 (TWO) TIMES DAILY.   furosemide 40 MG tablet Commonly known as:  LASIX Take 1 tablet (40 mg total) by mouth daily.   imatinib 100 MG tablet Commonly known as:  GLEEVEC Take 300 mg by mouth daily with lunch. Take with noon meal and a large glass of water.Caution:Chemotherapy   isosorbide mononitrate 30 MG 24 hr tablet Commonly known as:  IMDUR Take 1 tablet (30 mg total) by mouth daily.   lidocaine 5 % Commonly known as:  LIDODERM Place 1 patch onto the skin daily as needed (for back pain). Remove & Discard patch within 12 hours or as directed by MD   lisinopril 10 MG tablet Commonly known as:  PRINIVIL,ZESTRIL Take 1 tablet (10 mg total) by mouth daily.   loperamide 2 MG capsule Commonly known as:  IMODIUM Take 2-4 mg by mouth 3 (three) times daily as needed for diarrhea or loose stools.   multivitamin tablet Take 1 tablet by mouth daily.   oxyCODONE 5 MG immediate release tablet Commonly known as:  Oxy IR/ROXICODONE Take 1 tablet (5 mg total) by mouth every 6 (six) hours as needed for moderate pain or severe pain.    oxyCODONE-acetaminophen 5-325 MG tablet Commonly known as:  PERCOCET/ROXICET Take 1 tablet by mouth every 6 (six) hours as needed.   potassium chloride 10 MEQ tablet Commonly known as:  K-DUR Take 1 tablet (10 mEq total) by mouth daily.   predniSONE 1 MG tablet Commonly known as:  DELTASONE Take 3 tablets (3 mg total) by mouth daily with breakfast.   pyridostigmine 60 MG tablet Commonly known as:  MESTINON Take 60 mg by mouth daily.   rosuvastatin 10 MG tablet Commonly known as:  CRESTOR Take 5 mg by mouth daily.      Verbal and written Discharge instructions given to the patient. Wound care per Discharge AVS Follow-up Information    [name], MD Follow up in 3 week(s).   Specialty:  Vascular Surgery Why:  office will call Contact information: [address] [place] [address] [phone]        [facility] Follow up.   Specialty:  [place] Why:  HHPT arranged - they will call you to set up home visits Contact information: [name] [address] [phone]           Signed: Roxy Horseman 12/29/2017, 8:35 AM

## 2017-12-30 ENCOUNTER — Telehealth: Payer: Self-pay | Admitting: Vascular Surgery

## 2017-12-30 NOTE — Telephone Encounter (Signed)
-----   Message from Mena Goes, RN sent at 12/28/2017  2:57 PM EST ----- Regarding: 2-3 weeks   ----- Message ----- From: Ulyses Amor, PA-C Sent: 12/28/2017   1:23 PM EST To: [facility] Admin Pool, [facility] Charge Pool   F/U with Dr. Carlis Abbott in 2-3 weeks s/p endo stenting right LE open incision left groin.

## 2017-12-30 NOTE — Telephone Encounter (Signed)
sch appt spk to pt 01/11/18 3pm p/o MD

## 2018-01-04 ENCOUNTER — Ambulatory Visit (INDEPENDENT_AMBULATORY_CARE_PROVIDER_SITE_OTHER): Payer: Medicare Other | Admitting: Cardiovascular Disease

## 2018-01-04 ENCOUNTER — Encounter: Payer: Self-pay | Admitting: Cardiovascular Disease

## 2018-01-04 VITALS — BP 110/64 | HR 85 | Ht 67.0 in | Wt 170.2 lb

## 2018-01-04 DIAGNOSIS — I7090 Unspecified atherosclerosis: Secondary | ICD-10-CM

## 2018-01-04 DIAGNOSIS — E785 Hyperlipidemia, unspecified: Secondary | ICD-10-CM

## 2018-01-04 DIAGNOSIS — I2510 Atherosclerotic heart disease of native coronary artery without angina pectoris: Secondary | ICD-10-CM | POA: Diagnosis not present

## 2018-01-04 DIAGNOSIS — I739 Peripheral vascular disease, unspecified: Secondary | ICD-10-CM

## 2018-01-04 NOTE — Progress Notes (Signed)
Cardiology Office Note   Date:  01/06/2018   ID:  [name], DOB Mar 05, 1940, MRN [id number]  PCP:  Libby Maw, MD  Cardiologist:  Dr. Burt Knack  Chief Complaint  Patient presents with  . Follow-up    pt c/o soreness when walking      History of Present Illness: [name] is a 77 y.o. male who is here today for a follow-up visit regarding peripheral arterial disease.  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   He was seen in 2018 for severe bilateral calf discomfort with minimal distance walking. Noninvasive vascular evaluation showed severely reduced ABI on the right side and moderately reduced on the left side. Duplex was overall suboptimal. It did show occluded left SFA.  I proceeded with CO2 angiography in October 2018 which showed no significant aortoiliac disease.  There was heavily calcified occluded distal left SFA into the proximal left popliteal artery with three-vessel runoff below the knee.  This was treated with drug-coated balloon angioplasty and self-expanding stent placement due to flow-limiting dissection. On the right side, there was a calcified eccentric stenosis affecting the common femoral artery with borderline significant disease affecting the distal SFA and distal popliteal arteries.  The patient presented recently with acute limb ischemia affecting the right lower extremity with evidence of occluded popliteal artery by duplex.  I sent him to the hospital and he was seen by Dr. Carlis Abbott.  He underwent extensive endovascular intervention on the right lower extremity with stenting of the popliteal and SFA arteries.  There was extensive thrombus which extended distally with poor flow.  Patient had bleeding from the left groin that required surgical exploration.  He required transfusion. He had a signal to the anterior tibial artery on the right side by  Doppler in spite of poor flow by angiogram. The patient reports improvement in his right leg pain although he continues to have significant numbness.   Past Medical History:  Diagnosis Date  . Bruises easily    d/t being on Eliquis  . Carotid artery disease ([place])    a. Duplex 05/2014: 31-51% RICA, 7-61% LICA, elevated velocities in right subclavian artery, normal left subclavian artery..  . Chronic back pain    HNP  . Chronic kidney disease (CKD)   . Claustrophobia    takes Ativan as needed  . 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 Coronary atherosclerosis of unspecified type of vessel, native or graft    a. cath in 2003 showed 10-20% LM, scattered 20% prox-mid LAD, 30% more distal LAD, 50-60% stenosis of LAD towards apex, 20% Cx, 30% dRCA, focal 95% stenosis in smaller of 2 branches of PDA, EF 60-65%.  . Diabetes mellitus ([place])   . Diarrhea    takes Imodium daily as needed  . Diverticulosis   . Diverticulosis of colon (without mention of hemorrhage)   . DJD (degenerative joint disease)   . DVT (deep venous thrombosis) ([place]) 07/2011  . Esophageal stricture   . Essential hypertension    takes Imdur and Lisinopril daily  . Gastric polyps    benign  . GERD (gastroesophageal reflux disease)    takes Nexium daily  . Glaucoma    uses eye drops  . History of bronchitis    not sure when the last time  . History of colon polyps    benign  . History of  kidney stones   . History of kidney stones   . Insomnia    takes Melatonin nightly as needed  . Mixed hyperlipidemia    takes Crestor daily  . Myasthenia gravis ([place])    uses prednisone  . Osteoporosis 2016  . Peripheral edema    takes Lasix daily  . Pituitary tumor    [facility]Walden at [facility] checks it every Jan   . Pneumonia 01/2016  . PONV (postoperative nausea and vomiting)   . Ptosis   . Pulmonary embolus ([place]) 2012  . Stroke [facility]) 06/2015    Past Surgical History:  Procedure Laterality  Date  . ABDOMINAL AORTOGRAM W/LOWER EXTREMITY N/A 11/18/2016   Procedure: ABDOMINAL AORTOGRAM W/LOWER EXTREMITY;  Surgeon: [name], MD;  Location: [facility];  Service: Cardiovascular;  Laterality: N/A;  . AORTOGRAM Right 12/23/2017   Procedure: AORTOGRAM, RIGHT LOWER EXTREMITY ANGIOGRAM, RIGHT SUPERFICIAL FEMORAL ARTERY ANGIOPLASTY WITH STENT, RIGHT POPLITEAL ARTERY ANGIOPLASTY WITH STENT, LEFT GROIN CUTDOWN AND LEFT FEMORAL ARTERY REPAIR;  Surgeon: [name], MD;  Location: [facility];  Service: Vascular;  Laterality: Right;  . BACK SURGERY     x2, lumbar and cervical  . CARDIAC CATHETERIZATION    . CATARACT EXTRACTION Bilateral 12/12  . COLONOSCOPY    . ESOPHAGOGASTRODUODENOSCOPY ENDOSCOPY  04/2015  . IR GENERIC HISTORICAL  03/03/2016   IR IVC FILTER PLMT / S&I [name] GUID/MOD SED 03/03/2016 [name], MD [facility]INTER[facility] RAD  . IR GENERIC HISTORICAL  04/09/2016   IR RADIOLOGIST EVAL & MGMT 04/09/2016 [name], MD [facility] INTERV RAD  . IR GENERIC HISTORICAL  04/30/2016   IR IVC FILTER RETRIEVAL / S&I [name] GUID/MOD SED 04/30/2016 [name], MD [facility]-INTERV RAD  . LITHOTRIPSY    . LUMBAR LAMINECTOMY/DECOMPRESSION MICRODISCECTOMY Right 09/14/2012   Procedure: Right Lumbar three-four, four-five, Lumbar five-Sacral one decompressive laminectomy;  Surgeon: [name], MD;  Location: [facility];  Service: Neurosurgery;  Laterality: Right;  . LUMBAR LAMINECTOMY/DECOMPRESSION MICRODISCECTOMY Left 03/11/2016   Procedure: Left Lumbar Two-ThreeMicrodiscectomy;  Surgeon: [name], MD;  Location: [facility];  Service: Neurosurgery;  Laterality: Left;  . PERIPHERAL VASCULAR INTERVENTION  11/18/2016   Procedure: PERIPHERAL VASCULAR INTERVENTION;  Surgeon: [name], MD;  Location: [facility];  Service: Cardiovascular;;  Left popliteal  . SHOULDER SURGERY Left 05/07/2009  . TONSILLECTOMY       Current Outpatient Medications  Medication Sig Dispense Refill  .  brimonidine-timolol (COMBIGAN) 0.2-0.5 % ophthalmic solution Place 1 drop into both eyes 2 (two) times daily.     . cholecalciferol (VITAMIN D) 1000 units tablet Take 1,000 Units by mouth daily.     Marland Kitchen ELIQUIS 5 MG TABS tablet TAKE 1 TABLET (5 MG TOTAL) BY MOUTH 2 (TWO) TIMES DAILY. 60 tablet 3  . furosemide (LASIX) 40 MG tablet Take 1 tablet (40 mg total) by mouth daily. 90 tablet 3  . imatinib (GLEEVEC) 100 MG tablet Take 300 mg by mouth daily with lunch. Take with noon meal and a large glass of water.Caution:Chemotherapy    . isosorbide mononitrate (IMDUR) 30 MG 24 hr tablet Take 1 tablet (30 mg total) by mouth daily. 90 tablet 3  . lidocaine (LIDODERM) 5 % Place 1 patch onto the skin daily as needed (for back pain). Remove & Discard patch within 12 hours or as directed by MD     . lisinopril (PRINIVIL,ZESTRIL) 10 MG tablet Take 1 tablet (10 mg total) by mouth daily. 30 tablet  11  . loperamide (IMODIUM) 2 MG capsule Take 2-4 mg by mouth 3 (three) times daily as needed for diarrhea or loose stools.     . Multiple Vitamin (MULTIVITAMIN) tablet Take 1 tablet by mouth daily.    Marland Kitchen oxyCODONE (OXY IR/ROXICODONE) 5 MG immediate release tablet Take 1 tablet (5 mg total) by mouth every 6 (six) hours as needed for moderate pain or severe pain. 20 tablet 0  . oxyCODONE-acetaminophen (PERCOCET/ROXICET) 5-325 MG tablet Take 1 tablet by mouth every 6 (six) hours as needed.  0  . potassium chloride (K-DUR) 10 MEQ tablet Take 1 tablet (10 mEq total) by mouth daily. 90 tablet 3  . predniSONE (DELTASONE) 1 MG tablet Take 3 tablets (3 mg total) by mouth daily with breakfast. 270 tablet 1  . pyridostigmine (MESTINON) 60 MG tablet Take 60 mg by mouth daily.    . rosuvastatin (CRESTOR) 10 MG tablet Take 5 mg by mouth daily.     No current facility-administered medications for this visit.     Allergies:   Phenergan [promethazine hcl]; Zofran [ondansetron hcl]; Other; and Sulfamethoxazole    Social History:  The  patient  reports that he has never smoked. He has never used smokeless tobacco. He reports that he does not drink alcohol or use drugs.   Family History:  The patient's family history includes Breast cancer in his sister; Clotting disorder in his sister; Dementia in his sister; Heart attack in his father, mother, sister, sister, and sister; Heart disease in his father, mother, and sister; Stroke in his father and mother.    ROS:  Please see the history of present illness.   Otherwise, review of systems are positive for none.   All other systems are reviewed and negative.    PHYSICAL EXAM: VS:  BP 110/64   Pulse 85   Ht 5\' 7"  (1.702 m)   Wt 170 lb 3.2 oz (77.2 kg)   SpO2 95%   BMI 26.66 kg/m  , BMI Body mass index is 26.66 kg/m. GEN: Well nourished, well developed, in no acute distress  HEENT: normal  Neck: no JVD, carotid bruits, or masses Cardiac: RRR; no murmurs, rubs, or gallops,no edema  Respiratory:  clear to auscultation bilaterally, normal work of breathing GI: soft, nontender, nondistended, + BS MS: no deformity or atrophy  Skin: warm and dry, no rash Neuro:  Strength and sensation are intact Psych: euthymic mood, full affect Vascular: Surgical scar in the left groin seems to be healing.  His right foot is warm but red distally.  EKG:  EKG is not ordered today.   Recent Labs: 09/27/2017: TSH 0.51 10/06/2017: Pro B Natriuretic peptide (BNP) 73.0 11/06/2017: Magnesium 2.0 12/15/2017: ALT 22 01/04/2018: BUN 11; Creatinine, Ser 1.80; Hemoglobin 10.7; Platelets 348; Potassium 4.8; Sodium 140    Lipid Panel    Component Value Date/Time   CHOL 134 07/20/2017 0525   TRIG 166 (H) 07/20/2017 0525   HDL 33 (L) 07/20/2017 0525   CHOLHDL 4.1 07/20/2017 0525   VLDL 33 07/20/2017 0525   LDLCALC 68 07/20/2017 0525   LDLDIRECT 59.8 10/12/2012 0737      Wt Readings from Last 3 Encounters:  01/04/18 170 lb 3.2 oz (77.2 kg)  12/28/17 177 lb 14.6 oz (80.7 kg)  12/23/17 170 lb  9.6 oz (77.4 kg)       No flowsheet data found.    ASSESSMENT AND PLAN:  1.  Peripheral arterial disease :   Status post left SFA  drug-coated balloon angioplasty and self-expanding stent placement with resolution of left calf claudication.  Recent acute limb ischemia affecting the right lower extremity due to what seems to be in situ thrombosis status post complex revascularization by Dr. Carlis Abbott.  The distal flow was very sluggish due to thrombus and there is still a possibility he might require amputation although we can consider relook angiography if needed to see if further interventions can be done now that the thrombus burden is likely less. His vascular access has been challenging due to heavily calcified vessels and he always bleeds. We will recheck CBC and basic metabolic profile in as a follow-up. He has a follow-up appointment with Dr. Carlis Abbott and we should consider ABI and duplex in the near future.  2. Coronary artery disease involving native coronary arteries without angina: Continue medical therapy.  3. CML: Currently on Gleevec.  4. Hyperlipidemia: Continue treatment with rosuvastatin with a target LDL of less than 70.    Disposition:   FU with me in 2 months  Signed,  [name], MD  01/06/2018 10:51 AM    [place]

## 2018-01-04 NOTE — Patient Instructions (Signed)
Medication Instructions:  Your physician recommends that you continue on your current medications as directed. Please refer to the Current Medication list given to you today.  If you need a refill on your cardiac medications before your next appointment, please call your pharmacy.   Lab work: Your physician recommends that you return for lab work in: TODAY - BMET/CBC  If you have labs (blood work) drawn today and your tests are completely normal, you will receive your results only by: Marland Kitchen MyChart Message (if you have MyChart) OR . A paper copy in the mail If you have any lab test that is abnormal or we need to change your treatment, we will call you to review the results.  Testing/Procedures: none  Follow-Up: At [facility], you and your health needs are our priority.  As part of our continuing mission to provide you with exceptional heart care, we have created designated Provider Care Teams.  These Care Teams include your primary Cardiologist (physician) and Advanced Practice Providers (APPs -  Physician Assistants and Nurse Practitioners) who all work together to provide you with the care you need, when you need it. You will need a follow up appointment in 2 months.  Please call our office 2 months in advance to schedule this appointment.  You may see Dr. Fletcher Anon - PV or one of the following Advanced Practice Providers on your designated Care Team:   Kerin Ransom, PA-C Roby Lofts, [place] . Sande Rives, PA-C  Any Other Special Instructions Will Be Listed Below (If Applicable).

## 2018-01-05 LAB — BASIC METABOLIC PANEL
BUN / CREAT RATIO: 6 — AB (ref 10–24)
BUN: 11 mg/dL (ref 8–27)
CALCIUM: 9.4 mg/dL (ref 8.6–10.2)
CHLORIDE: 96 mmol/L (ref 96–106)
CO2: 26 mmol/L (ref 20–29)
Creatinine, Ser: 1.8 mg/dL — ABNORMAL HIGH (ref 0.76–1.27)
GFR calc non Af Amer: 35 mL/min/{1.73_m2} — ABNORMAL LOW (ref >59)
GFR, EST AFRICAN AMERICAN: 40 mL/min/{1.73_m2} — AB (ref >59)
GLUCOSE: 109 mg/dL — AB (ref 65–99)
POTASSIUM: 4.8 mmol/L (ref 3.5–5.2)
Sodium: 140 mmol/L (ref 134–144)

## 2018-01-05 LAB — CBC WITH DIFFERENTIAL/PLATELET
Basophils Absolute: 0.1 10*3/uL (ref 0.0–0.2)
Basos: 1 %
EOS (ABSOLUTE): 0.2 10*3/uL (ref 0.0–0.4)
Eos: 2 %
HEMOGLOBIN: 10.7 g/dL — AB (ref 13.0–17.7)
Hematocrit: 32.8 % — ABNORMAL LOW (ref 37.5–51.0)
Immature Grans (Abs): 0.1 10*3/uL (ref 0.0–0.1)
Immature Granulocytes: 1 %
LYMPHS ABS: 1.2 10*3/uL (ref 0.7–3.1)
Lymphs: 13 %
MCH: 30.9 pg (ref 26.6–33.0)
MCHC: 32.6 g/dL (ref 31.5–35.7)
MCV: 95 fL (ref 79–97)
MONOCYTES: 10 %
MONOS ABS: 0.9 10*3/uL (ref 0.1–0.9)
NEUTROS ABS: 7 10*3/uL (ref 1.4–7.0)
Neutrophils: 73 %
PLATELETS: 348 10*3/uL (ref 150–450)
RBC: 3.46 x10E6/uL — AB (ref 4.14–5.80)
RDW: 14.3 % (ref 12.3–15.4)
WBC: 9.4 10*3/uL (ref 3.4–10.8)

## 2018-01-11 ENCOUNTER — Telehealth: Payer: Self-pay | Admitting: *Deleted

## 2018-01-11 ENCOUNTER — Encounter: Payer: Self-pay | Admitting: Vascular Surgery

## 2018-01-11 ENCOUNTER — Ambulatory Visit (INDEPENDENT_AMBULATORY_CARE_PROVIDER_SITE_OTHER): Payer: Self-pay | Admitting: Vascular Surgery

## 2018-01-11 VITALS — BP 139/51 | HR 76 | Temp 98.6°F | Resp 16 | Ht 67.0 in | Wt 169.8 lb

## 2018-01-11 DIAGNOSIS — I739 Peripheral vascular disease, unspecified: Secondary | ICD-10-CM

## 2018-01-11 MED ORDER — CLOPIDOGREL BISULFATE 75 MG PO TABS: 75.0000 mg | ORAL_TABLET | Freq: Every day | ORAL | 6 refills | Status: DC

## 2018-01-11 MED ORDER — OXYCODONE HCL 5 MG PO TABS: 5.0000 mg | ORAL_TABLET | Freq: Four times a day (QID) | ORAL | 0 refills | Status: DC | PRN

## 2018-01-11 NOTE — Progress Notes (Signed)
Patient name: [name] MRN: [id number] DOB: 07/11/1940 Sex: male  REASON FOR VISIT: 2-week follow-up with groin check for acute on chronic limb ischemia of his right lower extremity with intervention  HPI: [name] is a 77 y.o. male with multiple medical problems that previously presented several weeks ago with acute on chronic limb ischemia of his right lower extremity.  He underwent complex intervention in the hybrid OR and ultimately required angioplasty and stenting of his right SFA and popliteal artery that were all heavily calcified and occluded.  We left the OR with no signals and no flow into the foot after feeling like we had done all the intervention possible.  There was no distal bypass target.  Ultimately on heparin postop patient developed monophasic signals in the foot and has been doing okay other than having some numbness in the foot.  States he is back to walking.  He has been on his Eliquis that was previously started for an ischemic stroke.  He was supposed to be on Plavix but states he never received a prescription.  Past Medical History:  Diagnosis Date  . Bruises easily    d/t being on Eliquis  . Carotid artery disease ([place])    a. Duplex 05/2014: 67-61% RICA, 9-50% LICA, elevated velocities in right subclavian artery, normal left subclavian artery..  . Chronic back pain    HNP  . Chronic kidney disease (CKD)   . Claustrophobia    takes Ativan as needed  . 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 Coronary atherosclerosis of unspecified type of vessel, native or graft    a. cath in 2003 showed 10-20% LM, scattered 20% prox-mid LAD, 30% more distal LAD, 50-60% stenosis of LAD towards apex, 20% Cx, 30% dRCA, focal 95% stenosis in smaller of 2 branches of PDA, EF 60-65%.  . Diabetes mellitus ([place])   . Diarrhea    takes Imodium daily as needed  . Diverticulosis   . Diverticulosis of colon (without mention of hemorrhage)   . DJD (degenerative  joint disease)   . DVT (deep venous thrombosis) ([place]) 07/2011  . Esophageal stricture   . Essential hypertension    takes Imdur and Lisinopril daily  . Gastric polyps    benign  . GERD (gastroesophageal reflux disease)    takes Nexium daily  . Glaucoma    uses eye drops  . History of bronchitis    not sure when the last time  . History of colon polyps    benign  . History of kidney stones   . History of kidney stones   . Insomnia    takes Melatonin nightly as needed  . Mixed hyperlipidemia    takes Crestor daily  . Myasthenia gravis ([place])    uses prednisone  . Osteoporosis 2016  . Peripheral edema    takes Lasix daily  . Pituitary tumor    checks Dr.Walden at [facility] checks it every Jan   . Pneumonia 01/2016  . PONV (postoperative nausea and vomiting)   . Ptosis   . Pulmonary embolus ([place]) 2012  . Stroke [facility]) 06/2015    Past Surgical History:  Procedure Laterality Date  . ABDOMINAL AORTOGRAM W/LOWER EXTREMITY N/A 11/18/2016   Procedure: ABDOMINAL AORTOGRAM W/LOWER EXTREMITY;  Surgeon: [name], MD;  Location: [place] [facility];  Service: Cardiovascular;  Laterality: N/A;  . AORTOGRAM Right 12/23/2017   Procedure: AORTOGRAM, RIGHT LOWER EXTREMITY ANGIOGRAM, RIGHT SUPERFICIAL FEMORAL  ARTERY ANGIOPLASTY WITH STENT, RIGHT POPLITEAL ARTERY ANGIOPLASTY WITH STENT, LEFT GROIN CUTDOWN AND LEFT FEMORAL ARTERY REPAIR;  Surgeon: [name], MD;  Location: [place];  Service: Vascular;  Laterality: Right;  . BACK SURGERY     x2, lumbar and cervical  . CARDIAC CATHETERIZATION    . CATARACT EXTRACTION Bilateral 12/12  . COLONOSCOPY    . ESOPHAGOGASTRODUODENOSCOPY ENDOSCOPY  04/2015  . IR GENERIC HISTORICAL  03/03/2016   IR IVC FILTER PLMT / S&I [name] GUID/MOD SED 03/03/2016 [name], MD [facility]  . IR GENERIC HISTORICAL  04/09/2016   IR RADIOLOGIST EVAL & MGMT 04/09/2016 [name], MD [facility] INTER[facility] RAD  . IR GENERIC HISTORICAL  04/30/2016   IR IVC FILTER  RETRIEVAL / S&I [name] GUID/MOD SED 04/30/2016 [name], MD [facility]-INTERV RAD  . LITHOTRIPSY    . LUMBAR LAMINECTOMY/DECOMPRESSION MICRODISCECTOMY Right 09/14/2012   Procedure: Right Lumbar three-four, four-five, Lumbar five-Sacral one decompressive laminectomy;  Surgeon: [name], MD;  Location: [place] [facility];  Service: Neurosurgery;  Laterality: Right;  . LUMBAR LAMINECTOMY/DECOMPRESSION MICRODISCECTOMY Left 03/11/2016   Procedure: Left Lumbar Two-ThreeMicrodiscectomy;  Surgeon: [name], MD;  Location: [facility];  Service: Neurosurgery;  Laterality: Left;  . PERIPHERAL VASCULAR INTERVENTION  11/18/2016   Procedure: PERIPHERAL VASCULAR INTERVENTION;  Surgeon: [name], MD;  Location: [facility];  Service: Cardiovascular;;  Left popliteal  . SHOULDER SURGERY Left 05/07/2009  . TONSILLECTOMY      Family History  Problem Relation Age of Onset  . Heart disease Mother   . Heart attack Mother   . Stroke Mother   . Heart disease Father   . Heart attack Father   . Stroke Father   . Breast cancer Sister        Twin   . Heart attack Sister   . Dementia Sister   . Heart disease Sister   . Heart attack Sister   . Clotting disorder Sister   . Heart attack Sister   . Colon cancer Neg Hx     SOCIAL HISTORY: Social History   Tobacco Use  . Smoking status: Never Smoker  . Smokeless tobacco: Never Used  Substance Use Topics  . Alcohol use: No    Alcohol/week: 0.0 standard drinks    Allergies  Allergen Reactions  . Phenergan [Promethazine Hcl] Swelling and Other (See Comments)    Cannot be combined with Zofran because swelling of the eyes resulted and patient became very restless-  . Zofran [Ondansetron Hcl] Swelling and Other (See Comments)    Cannot be combined with Phenergan because swelling of the eyes resulted and patient became very restless-  . Other Other (See Comments)    Regarding scans, the PATIENT IS CLAUSTROPHOBIC!!!  . Sulfamethoxazole Rash and Itching      Current Outpatient Medications  Medication Sig Dispense Refill  . brimonidine-timolol (COMBIGAN) 0.2-0.5 % ophthalmic solution Place 1 drop into both eyes 2 (two) times daily.     . cholecalciferol (VITAMIN D) 1000 units tablet Take 1,000 Units by mouth daily.     Marland Kitchen ELIQUIS 5 MG TABS tablet TAKE 1 TABLET (5 MG TOTAL) BY MOUTH 2 (TWO) TIMES DAILY. 60 tablet 3  . furosemide (LASIX) 40 MG tablet Take 1 tablet (40 mg total) by mouth daily. 90 tablet 3  . imatinib (GLEEVEC) 100 MG tablet Take 300 mg by mouth daily with lunch. Take with noon meal and a large glass of water.Caution:Chemotherapy    . isosorbide mononitrate (  IMDUR) 30 MG 24 hr tablet Take 1 tablet (30 mg total) by mouth daily. 90 tablet 3  . lidocaine (LIDODERM) 5 % Place 1 patch onto the skin daily as needed (for back pain). Remove & Discard patch within 12 hours or as directed by MD     . lisinopril (PRINIVIL,ZESTRIL) 10 MG tablet Take 1 tablet (10 mg total) by mouth daily. 30 tablet 11  . loperamide (IMODIUM) 2 MG capsule Take 2-4 mg by mouth 3 (three) times daily as needed for diarrhea or loose stools.     . Multiple Vitamin (MULTIVITAMIN) tablet Take 1 tablet by mouth daily.    Marland Kitchen oxyCODONE (OXY IR/ROXICODONE) 5 MG immediate release tablet Take 1 tablet (5 mg total) by mouth every 6 (six) hours as needed for breakthrough pain. 25 tablet 0  . oxyCODONE-acetaminophen (PERCOCET/ROXICET) 5-325 MG tablet Take 1 tablet by mouth every 6 (six) hours as needed.  0  . potassium chloride (K-DUR) 10 MEQ tablet Take 1 tablet (10 mEq total) by mouth daily. 90 tablet 3  . predniSONE (DELTASONE) 1 MG tablet Take 3 tablets (3 mg total) by mouth daily with breakfast. 270 tablet 1  . pyridostigmine (MESTINON) 60 MG tablet Take 60 mg by mouth daily.    . rosuvastatin (CRESTOR) 10 MG tablet Take 5 mg by mouth daily.    . clopidogrel (PLAVIX) 75 MG tablet Take 1 tablet (75 mg total) by mouth daily. 30 tablet 6   No current facility-administered  medications for this visit.     REVIEW OF SYSTEMS:  [X]  denotes positive finding, [ ]  denotes negative finding Cardiac  Comments:  Chest pain or chest pressure:    Shortness of breath upon exertion:    Short of breath when lying flat:    Irregular heart rhythm:        Vascular    Pain in calf, thigh, or hip brought on by ambulation:    Pain in feet at night that wakes you up from your sleep:     Blood clot in your veins:    Leg swelling:         Pulmonary    Oxygen at home:    Productive cough:     Wheezing:         Neurologic    Sudden weakness in arms or legs:     Sudden numbness in arms or legs:     Sudden onset of difficulty speaking or slurred speech:    Temporary loss of vision in one eye:     Problems with dizziness:         Gastrointestinal    Blood in stool:     Vomited blood:         Genitourinary    Burning when urinating:     Blood in urine:        Psychiatric    Major depression:         Hematologic    Bleeding problems:    Problems with blood clotting too easily:        Skin    Rashes or ulcers:        Constitutional    Fever or chills:      PHYSICAL EXAM: Vitals:   01/11/18 0943  BP: (!) 139/51  Pulse: 76  Resp: 16  Temp: 98.6 F (37 C)  TempSrc: Oral  SpO2: 99%  Weight: 169 lb 12.8 oz (77 kg)  Height: 5\' 7"  (1.702 m)    GENERAL:  The patient is a well-nourished male, in no acute distress. The vital signs are documented above. CARDIAC: There is a regular rate and rhythm.  VASCULAR:  Left groin incision healing well with palpable femoral pulse Right palpable femoral pulse Right PT/DP/peroneal monophasic signals Foot appears viable, some dependent rubor in distal toes PULMONARY: There is good air exchange bilaterally without wheezing or rales. ABDOMEN: Soft and non-tender with normal pitched bowel sounds.  SKIN: There are no ulcers or rashes noted. PSYCHIATRIC: The patient has a normal affect.  DATA:   None to  review  Assessment/Plan:  77 year old male presents for a two-week follow-up after complex intervention for acute on chronic limb ischemia of his right lower extremity.  On evaluation today he does have monophasic dorsalis pedis, peroneal, and posterior tibial signal in the right foot and the foot now appears viable.  He does have a small scab on his right second toe from scraping his foot in his shoe.  I discussed follow-up again in 1 month to allow his left groin to completely heal since he required a cutdown in the OR with primary repair of the artery.  Pending how he is doing in one month we will get a arterial duplex of his right leg to look at his SFA and popliteal stents and may be amendable to another arteriogram since he has now developed signals in the foot and the foot appeared viable (previously felt all options were exhausted in the OR).   [name], MD [facility] Office: [phone] Pager: [place]

## 2018-01-11 NOTE — Telephone Encounter (Signed)
-----   Message from [name], MD sent at 01/06/2018  9:31 AM EST ----- Inform patient that labs showed stable renal function and improved hemoglobin.

## 2018-01-11 NOTE — Telephone Encounter (Signed)
Left a message for the patient to call back.  

## 2018-01-13 NOTE — Telephone Encounter (Signed)
Left a message for the patient to call back if the patient has any questions. Results released to [place].

## 2018-01-17 ENCOUNTER — Other Ambulatory Visit: Payer: Self-pay | Admitting: Neurology

## 2018-01-21 ENCOUNTER — Other Ambulatory Visit: Payer: Self-pay

## 2018-01-21 DIAGNOSIS — I739 Peripheral vascular disease, unspecified: Secondary | ICD-10-CM

## 2018-01-24 ENCOUNTER — Telehealth: Payer: Self-pay | Admitting: Neurology

## 2018-01-24 NOTE — Telephone Encounter (Signed)
Contacted the [place] at number listed and was connected the the [facility] neurology office. Left a vm requesting a call back to discuss medications for the patient. MB RN

## 2018-01-24 NOTE — Telephone Encounter (Signed)
Fraser Din @ [place] xt [phone] has called RN Jinny Blossom back, she is asking for a call back to [phone] xt [phone]

## 2018-01-24 NOTE — Telephone Encounter (Signed)
I contacted Pat( infectious disease nurse at the [facility] in [place]) and advised via voicemail that the patient is still currently taking Mestinon 60 mg 1 tab daily. Requested a call back to further discuss if needed. MB RN.

## 2018-01-24 NOTE — Telephone Encounter (Signed)
Pt requesting a call stating that the Day is needing a call to confirm that the pt is still needing to be on medication pyridostigmine (MESTINON) 60 MG tablet. [place] # [id number] ext [phone]. Please advise

## 2018-01-30 ENCOUNTER — Encounter (HOSPITAL_COMMUNITY): Payer: Self-pay | Admitting: Emergency Medicine

## 2018-01-30 ENCOUNTER — Emergency Department (HOSPITAL_COMMUNITY)
Admission: EM | Admit: 2018-01-30 | Discharge: 2018-01-30 | Disposition: A | Discharge status: Home / Self Care | Payer: Medicare Other | Attending: Emergency Medicine | Admitting: Emergency Medicine

## 2018-01-30 ENCOUNTER — Emergency Department (HOSPITAL_COMMUNITY): Payer: Medicare Other

## 2018-01-30 ENCOUNTER — Other Ambulatory Visit: Payer: Self-pay

## 2018-01-30 DIAGNOSIS — Z79899 Other long term (current) drug therapy: Secondary | ICD-10-CM | POA: Diagnosis not present

## 2018-01-30 DIAGNOSIS — Z7902 Long term (current) use of antithrombotics/antiplatelets: Secondary | ICD-10-CM | POA: Diagnosis not present

## 2018-01-30 DIAGNOSIS — M791 Myalgia, unspecified site: Secondary | ICD-10-CM | POA: Diagnosis not present

## 2018-01-30 DIAGNOSIS — S76912A Strain of unspecified muscles, fascia and tendons at thigh level, left thigh, initial encounter: Secondary | ICD-10-CM

## 2018-01-30 DIAGNOSIS — G7000 Myasthenia gravis without (acute) exacerbation: Secondary | ICD-10-CM | POA: Diagnosis not present

## 2018-01-30 DIAGNOSIS — N183 Chronic kidney disease, stage 3 (moderate): Secondary | ICD-10-CM | POA: Insufficient documentation

## 2018-01-30 DIAGNOSIS — W010XXA Fall on same level from slipping, tripping and stumbling without subsequent striking against object, initial encounter: Secondary | ICD-10-CM | POA: Diagnosis not present

## 2018-01-30 DIAGNOSIS — Z7901 Long term (current) use of anticoagulants: Secondary | ICD-10-CM | POA: Insufficient documentation

## 2018-01-30 DIAGNOSIS — Y9301 Activity, walking, marching and hiking: Secondary | ICD-10-CM | POA: Insufficient documentation

## 2018-01-30 DIAGNOSIS — Y92511 Restaurant or cafe as the place of occurrence of the external cause: Secondary | ICD-10-CM | POA: Insufficient documentation

## 2018-01-30 DIAGNOSIS — S79922A Unspecified injury of left thigh, initial encounter: Secondary | ICD-10-CM | POA: Diagnosis present

## 2018-01-30 DIAGNOSIS — I130 Hypertensive heart and chronic kidney disease with heart failure and stage 1 through stage 4 chronic kidney disease, or unspecified chronic kidney disease: Secondary | ICD-10-CM | POA: Diagnosis not present

## 2018-01-30 DIAGNOSIS — Y999 Unspecified external cause status: Secondary | ICD-10-CM | POA: Insufficient documentation

## 2018-01-30 DIAGNOSIS — I5032 Chronic diastolic (congestive) heart failure: Secondary | ICD-10-CM | POA: Insufficient documentation

## 2018-01-30 DIAGNOSIS — E1122 Type 2 diabetes mellitus with diabetic chronic kidney disease: Secondary | ICD-10-CM | POA: Diagnosis not present

## 2018-01-30 MED ORDER — OXYCODONE-ACETAMINOPHEN 5-325 MG PO TABS
1.0000 | ORAL_TABLET | Freq: Once | ORAL | Status: AC
  Administered 2018-01-30: 1 via ORAL
  Filled 2018-01-30: qty 1

## 2018-01-30 MED ORDER — TRAMADOL HCL 50 MG PO TABS: 50.0000 mg | ORAL_TABLET | Freq: Four times a day (QID) | ORAL | 0 refills | Status: DC | PRN

## 2018-01-30 NOTE — ED Triage Notes (Signed)
Patient from home, here with c/o fall and loss of balance due to his left leg giving out. States he's had a few falls in the past two weeks. Hx of stroke with deficits. On blood thinners: Eloquis and Plavix.

## 2018-01-30 NOTE — ED Notes (Signed)
Patient verbalizes understanding of discharge instructions. Opportunity for questioning and answers were provided. Armband removed by staff, pt discharged from ED.  

## 2018-01-30 NOTE — ED Provider Notes (Signed)
[facility] EMERGENCY DEPARTMENT Provider Note   CSN: [id number] Arrival date & time: 01/30/18  1625     History   Chief Complaint No chief complaint on file.   HPI [name] is a 77 y.o. male.  The history is provided by the patient and medical records. No language interpreter was used.  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  Patient reports since he had a stroke in the past, sometimes his legs will give out on him.  Today while leaving a restaurant and walking towards his car, his leg gave out, patient slipped down the ground and his left thigh went under him.  He denies hitting his head or loss of consciousness.  Report acute onset of sharp shooting pain to his left thigh.  Pain is moderate in severity.  No complaint of back pain, knee pain, or ankle pain.  No other injuries.  No specific treatment tried.  He normally walks with a walker.  He was brought here by family member.  He believes he may have pulled a muscle.  He denies any precipitating symptoms prior to the fall.  He was able to catch himself.  Past Medical History:  Diagnosis Date  . Bruises easily    d/t being on Eliquis  . Carotid artery disease ([place])    a. Duplex 05/2014: 40-98% RICA, 1-19% LICA, elevated velocities in right subclavian artery, normal left subclavian artery..  . Chronic back pain    HNP  . Chronic kidney disease (CKD)   . Claustrophobia    takes Ativan as needed  . 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 Coronary atherosclerosis of unspecified type of vessel, native or graft    a. cath in 2003 showed 10-20% LM, scattered 20% prox-mid LAD, 30% more distal LAD, 50-60% stenosis of LAD towards apex, 20% Cx, 30% dRCA, focal 95% stenosis in smaller of 2 branches of PDA, EF 60-65%.  . Diabetes mellitus ([place])   . Diarrhea    takes Imodium daily as needed  .  Diverticulosis   . Diverticulosis of colon (without mention of hemorrhage)   . DJD (degenerative joint disease)   . DVT (deep venous thrombosis) ([place]) 07/2011  . Esophageal stricture   . Essential hypertension    takes Imdur and Lisinopril daily  . Gastric polyps    benign  . GERD (gastroesophageal reflux disease)    takes Nexium daily  . Glaucoma    uses eye drops  . History of bronchitis    not sure when the last time  . History of colon polyps    benign  . History of kidney stones   . History of kidney stones   . Insomnia    takes Melatonin nightly as needed  . Mixed hyperlipidemia    takes Crestor daily  . Myasthenia gravis ([place])    uses prednisone  . Osteoporosis 2016  . Peripheral edema    takes Lasix daily  . Pituitary tumor    checks Dr[facility]Walden at [facility] checks it every Jan   . Pneumonia 01/2016  . PONV (postoperative nausea and vomiting)   . Ptosis   . Pulmonary embolus ([place]) 2012  . Stroke [facility]) 06/2015    Patient Active Problem List   Diagnosis Date Noted  . Pressure injury of skin 12/27/2017  . Arterial occlusive disease   .  Postoperative pain   . CKD (chronic kidney disease), stage III ([place])   . History of CVA (cerebrovascular accident)   . 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 Septic arthritis of left sternoclavicular joint ([place]) 11/03/2017  . Cervical spinal cord compression ([place]) 10/30/2017  . Nasal abscess 09/25/2017  . Immunocompromised ([place]) 09/25/2017  . Cellulitis of right axilla 09/25/2017  . Anemia 08/05/2017  . Transient ischemic attack (TIA) 07/23/2017  . Subcortical infarction ([place])   . Right hemiparesis ([place])   . Ataxia 07/21/2017  . TIA (transient ischemic attack)   . Thrombocytopenia ([place])   . Wheezing 06/17/2017  . Bursitis of left elbow 05/01/2017  . Dystonia 12/29/2016  . PAD (peripheral artery disease) ([place]) 11/18/2016  . Osteoporosis 08/28/2016  . Paronychia of great toe, left 08/10/2016  . Carotid  arterial disease ([place]) 07/09/2016  . Hypocalcemia 07/09/2016  . Diplopia 07/02/2016  . S/P lumbar laminectomy 03/11/2016  . Radicular pain of left lower extremity 02/21/2016  . Cough 01/10/2016  . 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 Myasthenia gravis ([place])   . Diabetes mellitus type 2 in nonobese (HCC)   . Coronary artery disease involving native coronary artery of native heart without angina pectoris   . Ataxia, post-stroke   . Gait disturbance, post-stroke   . Proximal leg weakness   . Cervical spondylosis without myelopathy   . Achilles tendinitis of right lower extremity   . Chronic diastolic CHF (congestive heart failure) ([place])   . Gastroesophageal reflux disease without esophagitis   . Chronic kidney disease   . Cerebral infarction involving left cerebellar artery ([place]) 06/20/2015  . Cerebellar stroke ([place])   . Ischemic stroke ([place])   . Cerebrovascular accident (CVA) due to thrombosis of left vertebral artery ([place])   . History of pulmonary embolus (PE)   . Chronic anticoagulation   . HLD (hyperlipidemia)   . Neck pain 03/19/2015  . Pain in the chest   . 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[place])   . Chronic diastolic heart failure, NYHA class 1 ([place]) 03/07/2015  . Diabetic gastroparesis ([place]) 03/07/2015  . Screening for osteoporosis 07/19/2014  . Dyspnea 04/09/2014  . Edema 04/09/2014  . Routine general medical examination at a health care facility 10/23/2013  . Bilateral carotid bruits 05/24/2013  . Abnormal CT scan, lung 05/24/2013  . Encounter for therapeutic drug monitoring 03/14/2013  . Lymphadenopathy, inguinal 01/11/2013  . AKI (acute kidney injury) ([place]) 10/28/2012  . 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 Myasthenia gravis with exacerbation ([place]) 03/31/2012  . Disease of salivary gland 12/09/2011  . Left facial pain 10/28/2011  . Pulmonary embolism ([place]) 10/25/2011  . 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 GERD with stricture 09/23/2011  . Long term (current) use of anticoagulants 08/03/2011  . History of pulmonary embolism 07/30/2011  . History of DVT (deep vein thrombosis) 07/30/2011  . Subconjunctival hemorrhage 06/22/2011  . Bilateral conjunctivitis 04/09/2011  . Diabetes mellitus type 2, controlled ([place]) 12/18/2010  . Hearing loss 08/04/2010  . Ptosis of eyelid 12/30/2009  . Essential hypertension 04/24/2009  . HYPERLIPIDEMIA, MIXED 10/05/2008  . HEMORRHOIDS-INTERNAL 07/02/2008  . HEMORRHOIDS-EXTERNAL 07/02/2008  . DIVERTICULOSIS-COLON 07/02/2008  . PERSONAL HX COLONIC POLYPS 07/02/2008  . RASH-NONVESICULAR 02/07/2008  . PNEUMONIA 12/08/2007  . Chronic myeloid leukemia in remission ([place]) 07/07/2007  . CAD (coronary artery disease) 07/07/2007  . NEPHROLITHIASIS, HX OF 07/07/2007    Past Surgical History:  Procedure Laterality Date  .  ABDOMINAL AORTOGRAM W/LOWER EXTREMITY N/A 11/18/2016   Procedure: ABDOMINAL AORTOGRAM W/LOWER EXTREMITY;  Surgeon: [name], MD;  Location: [facility];  Service: Cardiovascular;  Laterality: N/A;  . AORTOGRAM Right 12/23/2017   Procedure: AORTOGRAM, RIGHT LOWER EXTREMITY ANGIOGRAM, RIGHT SUPERFICIAL FEMORAL ARTERY ANGIOPLASTY WITH STENT, RIGHT POPLITEAL ARTERY ANGIOPLASTY WITH STENT, LEFT GROIN CUTDOWN AND LEFT FEMORAL ARTERY REPAIR;  Surgeon: [name], MD;  Location: [facility];  Service: Vascular;  Laterality: Right;  . BACK SURGERY     x2, lumbar and cervical  . CARDIAC CATHETERIZATION    . CATARACT EXTRACTION Bilateral 12/12  . COLONOSCOPY    . ESOPHAGOGASTRODUODENOSCOPY ENDOSCOPY  04/2015  . IR GENERIC HISTORICAL  03/03/2016   IR IVC FILTER PLMT / S&I [name] GUID/MOD SED 03/03/2016 [name], MD [facility]  . IR GENERIC HISTORICAL  04/09/2016   IR RADIOLOGIST EVAL & MGMT 04/09/2016 [name], MD [facility] INTER[facility] RAD  . IR GENERIC HISTORICAL  04/30/2016   IR IVC FILTER RETRIEVAL / S&I [name] GUID/MOD SED  04/30/2016 [name], MD [facility]-INTERV RAD  . LITHOTRIPSY    . LUMBAR LAMINECTOMY/DECOMPRESSION MICRODISCECTOMY Right 09/14/2012   Procedure: Right Lumbar three-four, four-five, Lumbar five-Sacral one decompressive laminectomy;  Surgeon: [name], MD;  Location: [facility];  Service: Neurosurgery;  Laterality: Right;  . LUMBAR LAMINECTOMY/DECOMPRESSION MICRODISCECTOMY Left 03/11/2016   Procedure: Left Lumbar Two-ThreeMicrodiscectomy;  Surgeon: [name], MD;  Location: [facility];  Service: Neurosurgery;  Laterality: Left;  . PERIPHERAL VASCULAR INTERVENTION  11/18/2016   Procedure: PERIPHERAL VASCULAR INTERVENTION;  Surgeon: [name], MD;  Location: [facility];  Service: Cardiovascular;;  Left popliteal  . SHOULDER SURGERY Left 05/07/2009  . TONSILLECTOMY          Home Medications    Prior to Admission medications   Medication Sig Start Date End Date Taking? Authorizing Provider  brimonidine-timolol (COMBIGAN) 0.2-0.5 % ophthalmic solution Place 1 drop into both eyes 2 (two) times daily.     [provider]  cholecalciferol (VITAMIN D) 1000 units tablet Take 1,000 Units by mouth daily.     [provider]  clopidogrel (PLAVIX) 75 MG tablet Take 1 tablet (75 mg total) by mouth daily. 01/11/18   [name], MD  ELIQUIS 5 MG TABS tablet TAKE 1 TABLET (5 MG TOTAL) BY MOUTH 2 (TWO) TIMES DAILY. 12/09/15   Kathrynn Ducking, MD  furosemide (LASIX) 40 MG tablet Take 1 tablet (40 mg total) by mouth daily. 11/26/17 11/21/18  Sherren Mocha, MD  imatinib (GLEEVEC) 100 MG tablet Take 300 mg by mouth daily with lunch. Take with noon meal and a large glass of water.Caution:Chemotherapy    [provider]  isosorbide mononitrate (IMDUR) 30 MG 24 hr tablet Take 1 tablet (30 mg total) by mouth daily. 09/17/15   Sherren Mocha, MD  lidocaine (LIDODERM) 5 % Place 1 patch onto the skin daily as needed (for back pain). Remove & Discard patch within 12  hours or as directed by MD     [provider]  lisinopril (PRINIVIL,ZESTRIL) 10 MG tablet Take 1 tablet (10 mg total) by mouth daily. 09/27/17   Renato Shin, MD  loperamide (IMODIUM) 2 MG capsule Take 2-4 mg by mouth 3 (three) times daily as needed for diarrhea or loose stools.     [provider]  Multiple Vitamin (MULTIVITAMIN) tablet Take 1 tablet by mouth daily.    [provider]  oxyCODONE (OXY IR/ROXICODONE) 5  MG immediate release tablet Take 1 tablet (5 mg total) by mouth every 6 (six) hours as needed for breakthrough pain. 01/11/18   [name], MD  oxyCODONE-acetaminophen (PERCOCET/ROXICET) 5-325 MG tablet Take 1 tablet by mouth every 6 (six) hours as needed. 12/13/17   [provider]  potassium chloride (K-DUR) 10 MEQ tablet Take 1 tablet (10 mEq total) by mouth daily. 11/26/17 11/21/18  Sherren Mocha, MD  predniSONE (DELTASONE) 1 MG tablet TAKE 3 TABLETS BY MOUTH ONCE DAILY WITH BREAKFAST 01/18/18   Kathrynn Ducking, MD  pyridostigmine (MESTINON) 60 MG tablet Take 60 mg by mouth daily.    [provider]  rosuvastatin (CRESTOR) 10 MG tablet Take 5 mg by mouth daily.    [provider]    Family History Family History  Problem Relation Age of Onset  . Heart disease Mother   . Heart attack Mother   . Stroke Mother   . Heart disease Father   . Heart attack Father   . Stroke Father   . Breast cancer Sister        Twin   . Heart attack Sister   . Dementia Sister   . Heart disease Sister   . Heart attack Sister   . Clotting disorder Sister   . Heart attack Sister   . Colon cancer Neg Hx     Social History Social History   Tobacco Use  . Smoking status: Never Smoker  . Smokeless tobacco: Never Used  Substance Use Topics  . Alcohol use: No    Alcohol/week: 0.0 standard drinks  . Drug use: No     Allergies   Phenergan [promethazine hcl]; Zofran [ondansetron hcl]; Other; and  Sulfamethoxazole   Review of Systems Review of Systems  Constitutional: Negative for fever.  Musculoskeletal: Positive for myalgias.  Skin: Negative for wound.  Neurological: Negative for numbness.     Physical Exam Updated Vital Signs There were no vitals taken for this visit.  Physical Exam Vitals signs and nursing note reviewed.  Constitutional:      General: He is not in acute distress.    Appearance: He is well-developed.  HENT:     Head: Atraumatic.  Eyes:     Conjunctiva/sclera: Conjunctivae normal.  Neck:     Musculoskeletal: Neck supple.  Cardiovascular:     Rate and Rhythm: Normal rate and regular rhythm.  Pulmonary:     Effort: Pulmonary effort is normal.     Breath sounds: Normal breath sounds.  Abdominal:     Palpations: Abdomen is soft.     Tenderness: There is no abdominal tenderness.  Musculoskeletal:        General: Tenderness (Left thigh: Point tenderness to proximal anterior thigh on palpation without any significant swelling bruising noted.  No crepitus or deformity.) present.     Comments: No tenderness to left hip, left knee, or left ankle.  Intact dorsalis pedis pulse with brisk cap refill to left lower extremity.  No midline spine tenderness.  Skin:    Findings: No rash.  Neurological:     Mental Status: He is alert.     Comments: Patient able to ambulate with assist.      ED Treatments / Results  Labs (all labs ordered are listed, but only abnormal results are displayed) Labs Reviewed - No data to display  EKG None  Radiology Dg Femur Min 2 Views Left  Result Date: 01/30/2018 CLINICAL DATA:  [place] at home 5 days ago when  LEFT leg gave out, LEFT anterior femur pain, multiple recent falls EXAM: LEFT FEMUR 2 VIEWS COMPARISON:  None FINDINGS: Osseous mineralization diffusely decreased. Hip and knee joint alignment normal. Mild joint space narrowing at LEFT knee identified. No acute fracture, dislocation, or bone destruction. Visualized  LEFT pelvis intact. Extensive atherosclerotic calcifications in pelvis and LEFT leg. Stent identified at distal superficial femoral artery. IMPRESSION: No acute osseous abnormalities. Extensive atherosclerotic calcification. Electronically Signed   By: Lavonia Dana M.D.   On: 01/30/2018 17:08    Procedures Procedures (including critical care time)  Medications Ordered in ED Medications  oxyCODONE-acetaminophen (PERCOCET/ROXICET) 5-325 MG per tablet 1 tablet (1 tablet Oral Given 01/30/18 1726)     Initial Impression / Assessment and Plan / ED Course  I have reviewed the triage vital signs and the nursing notes.  Pertinent labs & imaging results that were available during my care of the patient were reviewed by me and considered in my medical decision making (see chart for details).  Clinical Course as of Jan 30 1746  Sun Jan 31, 2440  5028 77 year old male on blood thinners here after a mechanical fall injuring his left thigh.  He is complaining of severe amount of pain although it seems very muscular in nature.  Is getting some x-ray of his lower leg.  He denies striking his head or his neck or his chest and has no headache or chest pain or shortness of breath.  Currently do not think he needs imaging of his head   [MB]    Clinical Course User Index [MB] Hayden Rasmussen, MD    BP 128/80   Pulse 96   Temp 98.2 F (36.8 C) (Oral)   Ht 5\' 7"  (1.702 m)   Wt 77.6 kg   SpO2 100%   BMI 26.78 kg/m    Final Clinical Impressions(s) / ED Diagnoses   Final diagnoses:  Muscle strain of left thigh, initial encounter    ED Discharge Orders         Ordered    traMADol (ULTRAM) 50 MG tablet  Every 6 hours PRN     01/30/18 1752         4:40 PM Patient report his leg gave out on him causing him to fall and left thigh went underneath him.  He is complaining of pain to the anterior thigh likely muscle strain.  X-ray ordered to rule out acute fracture although my suspicion is low.   Pain medication given.  Care discussed with Dr. Melina Copa.   5:48 PM X-ray of left femur without any acute osseous abnormalities.  Patient does have intact pedal pulses.  He normally walks with a walker.  Family member is available to help him going home.  Patient request pain medication to go home, will prescribe tramadol as he had in the past.  Outpatient follow-up recommended.  Return precaution discussed.   Domenic Moras, PA-C 01/30/18 1753    Hayden Rasmussen, MD 02/01/18 [id number]

## 2018-01-30 NOTE — ED Notes (Signed)
Marked pedal pulse on L foot.

## 2018-02-03 ENCOUNTER — Telehealth: Payer: Self-pay | Admitting: Vascular Surgery

## 2018-02-03 NOTE — Telephone Encounter (Signed)
Patient's wife called.  Says pt has an infected toe and is sick to his stomach.  He can barely walk.  He does not have a fever.  His toes are white and his feet are pink and red. Pt advised to go the the ED.  Wife verbalized understanding and will try to persuade her husband to go.   Thurston Hole., LPN

## 2018-02-05 ENCOUNTER — Encounter (HOSPITAL_COMMUNITY): Payer: No Typology Code available for payment source

## 2018-02-05 ENCOUNTER — Encounter (HOSPITAL_COMMUNITY): Payer: Self-pay

## 2018-02-05 ENCOUNTER — Inpatient Hospital Stay (HOSPITAL_COMMUNITY)
Admission: EM | Admit: 2018-02-05 | Discharge: 2018-02-10 | DRG: 240 | Disposition: A | Discharge status: Rehabilitation Facility | Payer: Medicare Other | Attending: Family Medicine | Admitting: Family Medicine

## 2018-02-05 ENCOUNTER — Other Ambulatory Visit: Payer: Self-pay

## 2018-02-05 DIAGNOSIS — Z7952 Long term (current) use of systemic steroids: Secondary | ICD-10-CM | POA: Diagnosis not present

## 2018-02-05 DIAGNOSIS — Z8249 Family history of ischemic heart disease and other diseases of the circulatory system: Secondary | ICD-10-CM

## 2018-02-05 DIAGNOSIS — Z86718 Personal history of other venous thrombosis and embolism: Secondary | ICD-10-CM | POA: Diagnosis not present

## 2018-02-05 DIAGNOSIS — Z7901 Long term (current) use of anticoagulants: Secondary | ICD-10-CM | POA: Diagnosis not present

## 2018-02-05 DIAGNOSIS — F40240 Claustrophobia: Secondary | ICD-10-CM | POA: Diagnosis present

## 2018-02-05 DIAGNOSIS — D631 Anemia in chronic kidney disease: Secondary | ICD-10-CM | POA: Diagnosis present

## 2018-02-05 DIAGNOSIS — Z89611 Acquired absence of right leg above knee: Secondary | ICD-10-CM | POA: Diagnosis not present

## 2018-02-05 DIAGNOSIS — E1159 Type 2 diabetes mellitus with other circulatory complications: Secondary | ICD-10-CM | POA: Diagnosis not present

## 2018-02-05 DIAGNOSIS — E1122 Type 2 diabetes mellitus with diabetic chronic kidney disease: Secondary | ICD-10-CM | POA: Diagnosis present

## 2018-02-05 DIAGNOSIS — I70261 Atherosclerosis of native arteries of extremities with gangrene, right leg: Secondary | ICD-10-CM | POA: Diagnosis not present

## 2018-02-05 DIAGNOSIS — Z86711 Personal history of pulmonary embolism: Secondary | ICD-10-CM

## 2018-02-05 DIAGNOSIS — T796XXA Traumatic ischemia of muscle, initial encounter: Secondary | ICD-10-CM | POA: Diagnosis not present

## 2018-02-05 DIAGNOSIS — S78111A Complete traumatic amputation at level between right hip and knee, initial encounter: Secondary | ICD-10-CM

## 2018-02-05 DIAGNOSIS — E785 Hyperlipidemia, unspecified: Secondary | ICD-10-CM | POA: Diagnosis not present

## 2018-02-05 DIAGNOSIS — I998 Other disorder of circulatory system: Secondary | ICD-10-CM | POA: Diagnosis present

## 2018-02-05 DIAGNOSIS — N189 Chronic kidney disease, unspecified: Secondary | ICD-10-CM | POA: Diagnosis not present

## 2018-02-05 DIAGNOSIS — D62 Acute posthemorrhagic anemia: Secondary | ICD-10-CM | POA: Diagnosis not present

## 2018-02-05 DIAGNOSIS — D72829 Elevated white blood cell count, unspecified: Secondary | ICD-10-CM | POA: Diagnosis not present

## 2018-02-05 DIAGNOSIS — G8918 Other acute postprocedural pain: Secondary | ICD-10-CM | POA: Diagnosis not present

## 2018-02-05 DIAGNOSIS — N183 Chronic kidney disease, stage 3 unspecified: Secondary | ICD-10-CM | POA: Diagnosis present

## 2018-02-05 DIAGNOSIS — S78119A Complete traumatic amputation at level between unspecified hip and knee, initial encounter: Secondary | ICD-10-CM | POA: Diagnosis not present

## 2018-02-05 DIAGNOSIS — I96 Gangrene, not elsewhere classified: Secondary | ICD-10-CM | POA: Diagnosis present

## 2018-02-05 DIAGNOSIS — E1152 Type 2 diabetes mellitus with diabetic peripheral angiopathy with gangrene: Secondary | ICD-10-CM | POA: Diagnosis not present

## 2018-02-05 DIAGNOSIS — Z823 Family history of stroke: Secondary | ICD-10-CM | POA: Diagnosis not present

## 2018-02-05 DIAGNOSIS — G7000 Myasthenia gravis without (acute) exacerbation: Secondary | ICD-10-CM | POA: Diagnosis present

## 2018-02-05 DIAGNOSIS — E119 Type 2 diabetes mellitus without complications: Secondary | ICD-10-CM

## 2018-02-05 DIAGNOSIS — I10 Essential (primary) hypertension: Secondary | ICD-10-CM | POA: Diagnosis present

## 2018-02-05 DIAGNOSIS — R350 Frequency of micturition: Secondary | ICD-10-CM | POA: Diagnosis not present

## 2018-02-05 DIAGNOSIS — Z7902 Long term (current) use of antithrombotics/antiplatelets: Secondary | ICD-10-CM

## 2018-02-05 DIAGNOSIS — Z8673 Personal history of transient ischemic attack (TIA), and cerebral infarction without residual deficits: Secondary | ICD-10-CM | POA: Diagnosis not present

## 2018-02-05 DIAGNOSIS — Z79899 Other long term (current) drug therapy: Secondary | ICD-10-CM

## 2018-02-05 DIAGNOSIS — M810 Age-related osteoporosis without current pathological fracture: Secondary | ICD-10-CM | POA: Diagnosis present

## 2018-02-05 DIAGNOSIS — I5032 Chronic diastolic (congestive) heart failure: Secondary | ICD-10-CM | POA: Diagnosis present

## 2018-02-05 DIAGNOSIS — N289 Disorder of kidney and ureter, unspecified: Secondary | ICD-10-CM | POA: Diagnosis not present

## 2018-02-05 DIAGNOSIS — E876 Hypokalemia: Secondary | ICD-10-CM | POA: Diagnosis not present

## 2018-02-05 DIAGNOSIS — I739 Peripheral vascular disease, unspecified: Secondary | ICD-10-CM

## 2018-02-05 DIAGNOSIS — C9211 Chronic myeloid leukemia, BCR/ABL-positive, in remission: Secondary | ICD-10-CM | POA: Diagnosis present

## 2018-02-05 DIAGNOSIS — K219 Gastro-esophageal reflux disease without esophagitis: Secondary | ICD-10-CM | POA: Diagnosis present

## 2018-02-05 DIAGNOSIS — I70201 Unspecified atherosclerosis of native arteries of extremities, right leg: Secondary | ICD-10-CM | POA: Diagnosis not present

## 2018-02-05 DIAGNOSIS — E782 Mixed hyperlipidemia: Secondary | ICD-10-CM | POA: Diagnosis present

## 2018-02-05 DIAGNOSIS — I130 Hypertensive heart and chronic kidney disease with heart failure and stage 1 through stage 4 chronic kidney disease, or unspecified chronic kidney disease: Secondary | ICD-10-CM | POA: Diagnosis present

## 2018-02-05 DIAGNOSIS — I2510 Atherosclerotic heart disease of native coronary artery without angina pectoris: Secondary | ICD-10-CM | POA: Diagnosis present

## 2018-02-05 DIAGNOSIS — G4700 Insomnia, unspecified: Secondary | ICD-10-CM | POA: Diagnosis present

## 2018-02-05 DIAGNOSIS — Z832 Family history of diseases of the blood and blood-forming organs and certain disorders involving the immune mechanism: Secondary | ICD-10-CM

## 2018-02-05 DIAGNOSIS — L97519 Non-pressure chronic ulcer of other part of right foot with unspecified severity: Secondary | ICD-10-CM | POA: Diagnosis not present

## 2018-02-05 DIAGNOSIS — N179 Acute kidney failure, unspecified: Secondary | ICD-10-CM | POA: Diagnosis present

## 2018-02-05 DIAGNOSIS — I2583 Coronary atherosclerosis due to lipid rich plaque: Secondary | ICD-10-CM | POA: Diagnosis not present

## 2018-02-05 LAB — CBC WITH DIFFERENTIAL/PLATELET
Abs Immature Granulocytes: 0.03 10*3/uL (ref 0.00–0.07)
Basophils Absolute: 0 10*3/uL (ref 0.0–0.1)
Basophils Relative: 0 %
EOS ABS: 0.3 10*3/uL (ref 0.0–0.5)
EOS PCT: 3 %
HEMATOCRIT: 35.7 % — AB (ref 39.0–52.0)
Hemoglobin: 11.8 g/dL — ABNORMAL LOW (ref 13.0–17.0)
Immature Granulocytes: 0 %
LYMPHS ABS: 1.5 10*3/uL (ref 0.7–4.0)
Lymphocytes Relative: 13 %
MCH: 31.1 pg (ref 26.0–34.0)
MCHC: 33.1 g/dL (ref 30.0–36.0)
MCV: 94.2 fL (ref 80.0–100.0)
MONO ABS: 1.4 10*3/uL — AB (ref 0.1–1.0)
Monocytes Relative: 12 %
NRBC: 0 % (ref 0.0–0.2)
Neutro Abs: 7.8 10*3/uL — ABNORMAL HIGH (ref 1.7–7.7)
Neutrophils Relative %: 72 %
Platelets: 318 10*3/uL (ref 150–400)
RBC: 3.79 MIL/uL — ABNORMAL LOW (ref 4.22–5.81)
RDW: 14 % (ref 11.5–15.5)
WBC: 11 10*3/uL — ABNORMAL HIGH (ref 4.0–10.5)

## 2018-02-05 LAB — CBG MONITORING, ED: Glucose-Capillary: 102 mg/dL — ABNORMAL HIGH (ref 70–99)

## 2018-02-05 LAB — COMPREHENSIVE METABOLIC PANEL
ALK PHOS: 63 U/L (ref 38–126)
ALT: 17 U/L (ref 0–44)
AST: 34 U/L (ref 15–41)
Albumin: 3.6 g/dL (ref 3.5–5.0)
Anion gap: 16 — ABNORMAL HIGH (ref 5–15)
BUN: 16 mg/dL (ref 8–23)
CALCIUM: 9.3 mg/dL (ref 8.9–10.3)
CO2: 25 mmol/L (ref 22–32)
CREATININE: 2.33 mg/dL — AB (ref 0.61–1.24)
Chloride: 96 mmol/L — ABNORMAL LOW (ref 98–111)
GFR, EST AFRICAN AMERICAN: 30 mL/min — AB (ref >60)
GFR, EST NON AFRICAN AMERICAN: 26 mL/min — AB (ref >60)
Glucose, Bld: 113 mg/dL — ABNORMAL HIGH (ref 70–99)
Potassium: 3.7 mmol/L (ref 3.5–5.1)
Sodium: 137 mmol/L (ref 135–145)
Total Bilirubin: 0.9 mg/dL (ref 0.3–1.2)
Total Protein: 6.2 g/dL — ABNORMAL LOW (ref 6.5–8.1)

## 2018-02-05 LAB — LACTIC ACID, PLASMA
Lactic Acid, Venous: 1.4 mmol/L (ref 0.5–1.9)
Lactic Acid, Venous: 0.9 mmol/L (ref 0.5–1.9)

## 2018-02-05 LAB — I-STAT CG4 LACTIC ACID, ED: Lactic Acid, Venous: 2.49 mmol/L (ref 0.5–1.9)

## 2018-02-05 LAB — CG4 I-STAT (LACTIC ACID): Lactic Acid, Venous: 1.04 mmol/L (ref 0.5–1.9)

## 2018-02-05 LAB — PROTIME-INR
INR: 1.39
PROTHROMBIN TIME: 16.9 s — AB (ref 11.4–15.2)

## 2018-02-05 MED ORDER — MORPHINE SULFATE (PF) 4 MG/ML IV SOLN
4.0000 mg | Freq: Once | INTRAVENOUS | Status: AC
  Administered 2018-02-05: 4 mg via INTRAVENOUS
  Filled 2018-02-05: qty 1

## 2018-02-05 MED ORDER — VANCOMYCIN HCL IN DEXTROSE 1-5 GM/200ML-% IV SOLN
1000.0000 mg | Freq: Once | INTRAVENOUS | Status: AC
  Administered 2018-02-05: 1000 mg via INTRAVENOUS
  Filled 2018-02-05: qty 200

## 2018-02-05 MED ORDER — HEPARIN (PORCINE) 25000 UT/250ML-% IV SOLN
500.0000 [IU]/h | INTRAVENOUS | Status: DC
  Administered 2018-02-05: 1200 [IU]/h via INTRAVENOUS
  Administered 2018-02-06: 700 [IU]/h via INTRAVENOUS
  Filled 2018-02-05 (×2): qty 250

## 2018-02-05 MED ORDER — PYRIDOSTIGMINE BROMIDE 60 MG PO TABS
60.0000 mg | ORAL_TABLET | Freq: Every day | ORAL | Status: DC
  Administered 2018-02-06 – 2018-02-10 (×4): 60 mg via ORAL
  Filled 2018-02-05 (×5): qty 1

## 2018-02-05 MED ORDER — MUPIROCIN 2 % EX OINT
1.0000 "application " | TOPICAL_OINTMENT | Freq: Two times a day (BID) | CUTANEOUS | Status: DC
  Administered 2018-02-06 – 2018-02-07 (×3): 1 via NASAL
  Filled 2018-02-05 (×2): qty 22

## 2018-02-05 MED ORDER — ISOSORBIDE MONONITRATE ER 30 MG PO TB24
30.0000 mg | ORAL_TABLET | Freq: Every day | ORAL | Status: DC
  Administered 2018-02-06 – 2018-02-10 (×4): 30 mg via ORAL
  Filled 2018-02-05 (×4): qty 1

## 2018-02-05 MED ORDER — IMATINIB MESYLATE 100 MG PO TABS
300.0000 mg | ORAL_TABLET | Freq: Every day | ORAL | Status: DC
  Administered 2018-02-06 – 2018-02-10 (×4): 300 mg via ORAL
  Filled 2018-02-05 (×5): qty 3

## 2018-02-05 MED ORDER — SENNOSIDES-DOCUSATE SODIUM 8.6-50 MG PO TABS: 1.0000 | ORAL_TABLET | Freq: Every evening | ORAL | Status: DC | PRN

## 2018-02-05 MED ORDER — MORPHINE SULFATE (PF) 2 MG/ML IV SOLN
1.0000 mg | INTRAVENOUS | Status: DC | PRN
  Administered 2018-02-05 – 2018-02-07 (×9): 2 mg via INTRAVENOUS
  Filled 2018-02-05 (×9): qty 1

## 2018-02-05 MED ORDER — LOPERAMIDE HCL 2 MG PO CAPS: 2.0000 mg | ORAL_CAPSULE | Freq: Three times a day (TID) | ORAL | Status: DC | PRN

## 2018-02-05 MED ORDER — ACETAMINOPHEN 325 MG PO TABS: 650.0000 mg | ORAL_TABLET | Freq: Four times a day (QID) | ORAL | Status: DC | PRN

## 2018-02-05 MED ORDER — SODIUM CHLORIDE 0.9 % IV SOLN
INTRAVENOUS | Status: DC
  Administered 2018-02-05: 18:00:00 via INTRAVENOUS

## 2018-02-05 MED ORDER — ADULT MULTIVITAMIN W/MINERALS CH
1.0000 | ORAL_TABLET | Freq: Every day | ORAL | Status: DC
  Administered 2018-02-06 – 2018-02-10 (×4): 1 via ORAL
  Filled 2018-02-05 (×4): qty 1

## 2018-02-05 MED ORDER — PREDNISONE 1 MG PO TABS
3.0000 mg | ORAL_TABLET | Freq: Every day | ORAL | Status: DC
  Administered 2018-02-06 – 2018-02-10 (×4): 3 mg via ORAL
  Filled 2018-02-05 (×5): qty 3

## 2018-02-05 MED ORDER — BRIMONIDINE TARTRATE-TIMOLOL 0.2-0.5 % OP SOLN
1.0000 [drp] | Freq: Two times a day (BID) | OPHTHALMIC | Status: DC
  Filled 2018-02-05: qty 5

## 2018-02-05 MED ORDER — ACETAMINOPHEN 650 MG RE SUPP: 650.0000 mg | Freq: Four times a day (QID) | RECTAL | Status: DC | PRN

## 2018-02-05 MED ORDER — ROSUVASTATIN CALCIUM 5 MG PO TABS
5.0000 mg | ORAL_TABLET | Freq: Every day | ORAL | Status: DC
  Administered 2018-02-06 – 2018-02-10 (×4): 5 mg via ORAL
  Filled 2018-02-05 (×4): qty 1

## 2018-02-05 MED ORDER — VANCOMYCIN HCL 500 MG IV SOLR
500.0000 mg | INTRAVENOUS | Status: DC
  Administered 2018-02-06: 500 mg via INTRAVENOUS
  Filled 2018-02-05 (×2): qty 500

## 2018-02-05 MED ORDER — SODIUM CHLORIDE 0.9 % IV SOLN
2.0000 g | INTRAVENOUS | Status: DC
  Administered 2018-02-05 – 2018-02-08 (×4): 2 g via INTRAVENOUS
  Filled 2018-02-05 (×5): qty 20

## 2018-02-05 MED ORDER — VITAMIN D 25 MCG (1000 UNIT) PO TABS
1000.0000 [IU] | ORAL_TABLET | Freq: Every day | ORAL | Status: DC
  Administered 2018-02-06 – 2018-02-10 (×4): 1000 [IU] via ORAL
  Filled 2018-02-05 (×4): qty 1

## 2018-02-05 MED ORDER — HYDROCODONE-ACETAMINOPHEN 10-325 MG PO TABS
1.0000 | ORAL_TABLET | Freq: Four times a day (QID) | ORAL | Status: DC | PRN
  Administered 2018-02-05 – 2018-02-08 (×5): 1 via ORAL
  Filled 2018-02-05 (×5): qty 1

## 2018-02-05 MED ORDER — LISINOPRIL 10 MG PO TABS
10.0000 mg | ORAL_TABLET | Freq: Every day | ORAL | Status: DC
  Administered 2018-02-06: 10 mg via ORAL
  Filled 2018-02-05: qty 1

## 2018-02-05 MED ORDER — FUROSEMIDE 20 MG PO TABS
20.0000 mg | ORAL_TABLET | Freq: Every day | ORAL | Status: DC
  Administered 2018-02-06 – 2018-02-10 (×4): 20 mg via ORAL
  Filled 2018-02-05 (×4): qty 1

## 2018-02-05 NOTE — ED Triage Notes (Signed)
Pt endorses right foot redness/inflammation with n/v and chills. Pt is 2 weeks postop having stents placed in right leg for a major blockage. Called the surgeon and sent here. VSS in triage.

## 2018-02-05 NOTE — Progress Notes (Signed)
Pt received from ED. VSS. CHG complete. R DP pulse absent. Pt and family oriented to room and unit. Dinner tray ordered. Will continue to monitor.  Clyde Canterbury, RN

## 2018-02-05 NOTE — ED Provider Notes (Signed)
Emergency Department Provider Note   I have reviewed the triage vital signs and the nursing notes.   HISTORY  Chief Complaint Post-op Problem   HPI [name] is a 77 y.o. male with PMH of CAD, PVD s/p recanalization of the right superficial femoral and popliteal with stent placement on 12/24/17 with Dr. Carlis Abbott presents to the emergency department for evaluation of worsening pain in the right foot.  No injury.  Patient states that he is had several days of symptoms.  He states that the redness to the top of the foot is unchanged but that his pain is worse.  He states that he has poor sensation in that foot and has struck it on several objects while walking which has caused several wounds to develop.  He denies fevers or chills.  Has been compliant with his medications.  I noticed that his toes have a white color he states that this is unchanged from surgery. No CP or SOB.  Patient's wife states that he has had a very poor appetite over the past several days.    Past Medical History:  Diagnosis Date  . Bruises easily    d/t being on Eliquis  . Carotid artery disease ([place])    a. Duplex 05/2014: 70-62% RICA, 3-76% LICA, elevated velocities in right subclavian artery, normal left subclavian artery..  . Chronic back pain    HNP  . Chronic kidney disease (CKD)   . Claustrophobia    takes Ativan as needed  . 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 Coronary atherosclerosis of unspecified type of vessel, native or graft    a. cath in 2003 showed 10-20% LM, scattered 20% prox-mid LAD, 30% more distal LAD, 50-60% stenosis of LAD towards apex, 20% Cx, 30% dRCA, focal 95% stenosis in smaller of 2 branches of PDA, EF 60-65%.  . Diabetes mellitus ([place])   . Diarrhea    takes Imodium daily as needed  . Diverticulosis   . Diverticulosis of colon (without mention of hemorrhage)   . DJD (degenerative joint disease)   . DVT (deep venous thrombosis) ([place]) 07/2011  .  Esophageal stricture   . Essential hypertension    takes Imdur and Lisinopril daily  . Gastric polyps    benign  . GERD (gastroesophageal reflux disease)    takes Nexium daily  . Glaucoma    uses eye drops  . History of bronchitis    not sure when the last time  . History of colon polyps    benign  . History of kidney stones   . History of kidney stones   . Insomnia    takes Melatonin nightly as needed  . Mixed hyperlipidemia    takes Crestor daily  . Myasthenia gravis ([place])    uses prednisone  . Osteoporosis 2016  . Peripheral edema    takes Lasix daily  . Pituitary tumor    checks Dr.Walden at [facility] checks it every Jan   . Pneumonia 01/2016  . PONV (postoperative nausea and vomiting)   . Ptosis   . Pulmonary embolus ([place]) 2012  . Stroke [facility]) 06/2015    Patient Active Problem List   Diagnosis Date Noted  . Pressure injury of skin 12/27/2017  . Arterial occlusive disease   . Postoperative pain   . CKD (chronic kidney disease), stage III ([place])   . History of CVA (cerebrovascular accident)   . 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  Septic arthritis of left sternoclavicular joint ([place]) 11/03/2017  . Cervical spinal cord compression ([place]) 10/30/2017  . Nasal abscess 09/25/2017  . Immunocompromised ([place]) 09/25/2017  . Cellulitis of right axilla 09/25/2017  . Anemia 08/05/2017  . Transient ischemic attack (TIA) 07/23/2017  . Subcortical infarction ([place])   . Right hemiparesis ([place])   . Ataxia 07/21/2017  . TIA (transient ischemic attack)   . Thrombocytopenia ([place])   . Wheezing 06/17/2017  . Bursitis of left elbow 05/01/2017  . Dystonia 12/29/2016  . PAD (peripheral artery disease) ([place]) 11/18/2016  . Osteoporosis 08/28/2016  . Paronychia of great toe, left 08/10/2016  . Carotid arterial disease ([place]) 07/09/2016  . Hypocalcemia 07/09/2016  . Diplopia 07/02/2016  . S/P lumbar laminectomy 03/11/2016  . Radicular pain of left lower extremity  02/21/2016  . Cough 01/10/2016  . 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 Myasthenia gravis ([place])   . Diabetes mellitus type 2 in nonobese (HCC)   . Coronary artery disease involving native coronary artery of native heart without angina pectoris   . Ataxia, post-stroke   . Gait disturbance, post-stroke   . Proximal leg weakness   . Cervical spondylosis without myelopathy   . Achilles tendinitis of right lower extremity   . Chronic diastolic CHF (congestive heart failure) ([place])   . Gastroesophageal reflux disease without esophagitis   . Chronic kidney disease   . Cerebral infarction involving left cerebellar artery ([place]) 06/20/2015  . Cerebellar stroke ([place])   . Ischemic stroke ([place])   . Cerebrovascular accident (CVA) due to thrombosis of left vertebral artery ([place])   . History of pulmonary embolus (PE)   . Chronic anticoagulation   . HLD (hyperlipidemia)   . Neck pain 03/19/2015  . Pain in the chest   . 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 Chronic diastolic heart failure, NYHA class 1 ([place]) 03/07/2015  . Diabetic gastroparesis ([place]) 03/07/2015  . Screening for osteoporosis 07/19/2014  . Dyspnea 04/09/2014  . Edema 04/09/2014  . Routine general medical examination at a health care facility 10/23/2013  . Bilateral carotid bruits 05/24/2013  . Abnormal CT scan, lung 05/24/2013  . Encounter for therapeutic drug monitoring 03/14/2013  . Lymphadenopathy, inguinal 01/11/2013  . AKI (acute kidney injury) ([place]) 10/28/2012  . 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 Myasthenia gravis with exacerbation ([place]) 03/31/2012  . Disease of salivary gland 12/09/2011  . Left facial pain 10/28/2011  . Pulmonary embolism ([place]) 10/25/2011  . 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 GERD with stricture 09/23/2011  . Delio Slates term (current) use of anticoagulants 08/03/2011  . History of pulmonary embolism 07/30/2011  . History of DVT (deep  vein thrombosis) 07/30/2011  . Subconjunctival hemorrhage 06/22/2011  . Bilateral conjunctivitis 04/09/2011  . Diabetes mellitus type 2, controlled ([place]) 12/18/2010  . Hearing loss 08/04/2010  . Ptosis of eyelid 12/30/2009  . Essential hypertension 04/24/2009  . HYPERLIPIDEMIA, MIXED 10/05/2008  . HEMORRHOIDS-INTERNAL 07/02/2008  . HEMORRHOIDS-EXTERNAL 07/02/2008  . DIVERTICULOSIS-COLON 07/02/2008  . PERSONAL HX COLONIC POLYPS 07/02/2008  . RASH-NONVESICULAR 02/07/2008  . PNEUMONIA 12/08/2007  . Chronic myeloid leukemia in remission ([place]) 07/07/2007  . CAD (coronary artery disease) 07/07/2007  . NEPHROLITHIASIS, HX OF 07/07/2007    Past Surgical History:  Procedure Laterality Date  . ABDOMINAL AORTOGRAM W/LOWER EXTREMITY N/A 11/18/2016   Procedure: ABDOMINAL AORTOGRAM W/LOWER EXTREMITY;  Surgeon: [name], MD;  Location: [place] CV LAB;  Service: Cardiovascular;  Laterality: N/A;  . AORTOGRAM Right 12/23/2017   Procedure: AORTOGRAM, RIGHT LOWER EXTREMITY ANGIOGRAM, RIGHT SUPERFICIAL FEMORAL ARTERY ANGIOPLASTY WITH STENT, RIGHT POPLITEAL ARTERY ANGIOPLASTY WITH STENT, LEFT GROIN CUTDOWN AND LEFT FEMORAL ARTERY REPAIR;  Surgeon: [name], MD;  Location: [facility];  Service: Vascular;  Laterality: Right;  . BACK SURGERY     x2, lumbar and cervical  . CARDIAC CATHETERIZATION    . CATARACT EXTRACTION Bilateral 12/12  . COLONOSCOPY    . ESOPHAGOGASTRODUODENOSCOPY ENDOSCOPY  04/2015  . IR GENERIC HISTORICAL  03/03/2016   IR IVC FILTER PLMT / S&I [name] GUID/MOD SED 03/03/2016 [name], MD [facility]-INTERV RAD  . IR GENERIC HISTORICAL  04/09/2016   IR RADIOLOGIST EVAL & MGMT 04/09/2016 [name], MD [facility] INTERV RAD  . IR GENERIC HISTORICAL  04/30/2016   IR IVC FILTER RETRIEVAL / S&I [name] GUID/MOD SED 04/30/2016 [name], MD [facility]-INTERV RAD  . LITHOTRIPSY    . LUMBAR LAMINECTOMY/DECOMPRESSION MICRODISCECTOMY Right 09/14/2012   Procedure: Right Lumbar three-four,  four-five, Lumbar five-Sacral one decompressive laminectomy;  Surgeon: [name], MD;  Location: [place] [facility]URO ORS;  Service: Neurosurgery;  Laterality: Right;  . LUMBAR LAMINECTOMY/DECOMPRESSION MICRODISCECTOMY Left 03/11/2016   Procedure: Left Lumbar Two-ThreeMicrodiscectomy;  Surgeon: [name], MD;  Location: [facility];  Service: Neurosurgery;  Laterality: Left;  . PERIPHERAL VASCULAR INTERVENTION  11/18/2016   Procedure: PERIPHERAL VASCULAR INTERVENTION;  Surgeon: [name], MD;  Location: [place] CV LAB;  Service: Cardiovascular;;  Left popliteal  . SHOULDER SURGERY Left 05/07/2009  . TONSILLECTOMY      Allergies Other; Phenergan [promethazine hcl]; Zofran [ondansetron hcl]; and Sulfamethoxazole  Family History  Problem Relation Age of Onset  . Heart disease Mother   . Heart attack Mother   . Stroke Mother   . Heart disease Father   . Heart attack Father   . Stroke Father   . Breast cancer Sister        Twin   . Heart attack Sister   . Dementia Sister   . Heart disease Sister   . Heart attack Sister   . Clotting disorder Sister   . Heart attack Sister   . Colon cancer Neg Hx     Social History Social History   Tobacco Use  . Smoking status: Never Smoker  . Smokeless tobacco: Never Used  Substance Use Topics  . Alcohol use: No    Alcohol/week: 0.0 standard drinks  . Drug use: No    Review of Systems  Constitutional: No fever/chills Eyes: No visual changes. ENT: No sore throat. Cardiovascular: Denies chest pain. Respiratory: Denies shortness of breath. Gastrointestinal: No abdominal pain.  No nausea, no vomiting.  No diarrhea.  No constipation. Genitourinary: Negative for dysuria. Musculoskeletal: Negative for back pain. Right foot pain.  Skin: Right foot wounds.  Neurological: Negative for headaches, focal weakness or numbness.  10-point ROS otherwise negative.  ____________________________________________   PHYSICAL EXAM:  VITAL  SIGNS: ED Triage Vitals  Enc Vitals Group     BP 02/05/18 1118 (!) 102/56     Pulse Rate 02/05/18 1118 88     Resp 02/05/18 1118 16     Temp 02/05/18 1118 98 F (36.7 C)     Temp Source 02/05/18 1118 Oral     SpO2 02/05/18 1118 100 %     Pain Score 02/05/18 1119 7   Constitutional: Alert and oriented. Well appearing and in no acute distress. Eyes: Conjunctivae are normal.  Head: Atraumatic. Nose:  No congestion/rhinnorhea. Mouth/Throat: Mucous membranes are moist.  Neck: No stridor.  Cardiovascular: Normal rate, regular rhythm. Grossly normal heart sounds. Unable/Doppler to palpate DP or PT pulses on the right.  Respiratory: Normal respiratory effort.  No retractions. Lungs CTAB. Gastrointestinal: Soft and nontender. No distention.  Musculoskeletal: No lower extremity tenderness nor edema. No gross deformities of extremities. Neurologic:  Normal speech and language. No gross focal neurologic deficits are appreciated.  Skin: White/pale coloration to the right toes. Erythema over the dorsum of the right foot.   ____________________________________________   LABS (all labs ordered are listed, but only abnormal results are displayed)  Labs Reviewed  COMPREHENSIVE METABOLIC PANEL - Abnormal; Notable for the following components:      Result Value   Chloride 96 (*)    Glucose, Bld 113 (*)    Creatinine, Ser 2.33 (*)    Total Protein 6.2 (*)    GFR calc non Af Amer 26 (*)    GFR calc Af Amer 30 (*)    Anion gap 16 (*)    All other components within normal limits  CBC WITH DIFFERENTIAL/PLATELET - Abnormal; Notable for the following components:   WBC 11.0 (*)    RBC 3.79 (*)    Hemoglobin 11.8 (*)    HCT 35.7 (*)    Neutro Abs 7.8 (*)    Monocytes Absolute 1.4 (*)    All other components within normal limits  PROTIME-INR - Abnormal; Notable for the following components:   Prothrombin Time 16.9 (*)    All other components within normal limits  I-STAT CG4 LACTIC ACID, ED -  Abnormal; Notable for the following components:   Lactic Acid, Venous 2.49 (*)    All other components within normal limits  CBG MONITORING, ED - Abnormal; Notable for the following components:   Glucose-Capillary 102 (*)    All other components within normal limits  I-STAT CG4 LACTIC ACID, ED   ____________________________________________   PROCEDURES  Procedure(s) performed:   Procedures  CRITICAL CARE Performed by: Margette Fast Total critical care time: 35 minutes Critical care time was exclusive of separately billable procedures and treating other patients. Critical care was necessary to treat or prevent imminent or life-threatening deterioration. Critical care was time spent personally by me on the following activities: development of treatment plan with patient and/or surrogate as well as nursing, discussions with consultants, evaluation of patient's response to treatment, examination of patient, obtaining history from patient or surrogate, ordering and performing treatments and interventions, ordering and review of laboratory studies, ordering and review of radiographic studies, pulse oximetry and re-evaluation of patient's condition.  Nanda Quinton, MD Emergency Medicine  ____________________________________________   INITIAL IMPRESSION / ASSESSMENT AND PLAN / ED COURSE  Pertinent labs & imaging results that were available during my care of the patient were reviewed by me and considered in my medical decision making (see chart for details).  Patient with erythema over the dorsal aspect of the right foot with pale toes.  Unable to Doppler or palpate DP or PT pulses on the right.  Wounds to the toes do not appear acutely infected but instead have formed an eschar.  Patient tells me that coloration to the foot including the erythema and pale color of toes is unchanged but pain is worse.  Plan to reach out to vascular surgery immediately and order ABI.   11:50 AM Spoke with  Dr. Donnetta Hutching with Vascular Surgery. ABI already ordered along with labs. He will evaluate.   Per  Dr. Donnetta Hutching the patient not longer has viable intravascular options and will likely need amputation. Tentatively post for Monday with Dr. Carlis Abbott. Labs reviewed. Will start abx. Plan for hospitalist admit.   Discussed patient's case with Hospitalist to request admission. Patient and family (if present) updated with plan. Care transferred to Hospitalist service.  I reviewed all nursing notes, vitals, pertinent old records, EKGs, labs, imaging (as available).  ____________________________________________  FINAL CLINICAL IMPRESSION(S) / ED DIAGNOSES  Final diagnoses:  Ischemia of right lower extremity    MEDICATIONS GIVEN DURING THIS VISIT:  Medications  vancomycin (VANCOCIN) IVPB 1000 mg/200 mL premix (has no administration in time range)  cefTRIAXone (ROCEPHIN) 2 g in sodium chloride 0.9 % 100 mL IVPB (2 g Intravenous New Bag/Given 02/05/18 1459)  vancomycin (VANCOCIN) 500 mg in sodium chloride 0.9 % 100 mL IVPB (has no administration in time range)  morphine 4 MG/ML injection 4 mg (4 mg Intravenous Given 02/05/18 1314)    Note:  This document was prepared using Dragon voice recognition software and may include unintentional dictation errors.  Nanda Quinton, MD Emergency Medicine    Trinnity Breunig, [facility], MD 02/05/18 [id number]

## 2018-02-05 NOTE — Consult Note (Signed)
[facility] [place]  Patient name: [name] MRN: [id number] DOB: 02-14-1940 Sex: male   HPI: [name] is a 77 y.o. male seen in the emergency department for evaluation of his right foot.  He is a very frail 77 year old gentleman with multiple prior medical issues.  He has known severe coronary disease.  History of diabetes renal insufficiency.  He had presented with acute on chronic right leg ischemia 1 month ago and underwent arteriography through a left groin cutdown and endovascular treatment of right superficial femoral artery and popliteal occlusion.  He had no identifiable tibial runoff.  He did continue to have ischemia of his foot but did have Doppler flow and was discharged with concern for high need for amputation.  Since discharge she has had progressive pain and ischemia in his right foot and presents to the emergency department today due to pain and some erythema on the dorsum of his foot.  He is here with his wife, daughter and son-in-law.  He reports that the pain is unmanageable and is difficult for him to walk related to the pain as well.  Past Medical History:  Diagnosis Date  . Bruises easily    d/t being on Eliquis  . Carotid artery disease ([place])    a. Duplex 05/2014: 32-35% RICA, 5-73% LICA, elevated velocities in right subclavian artery, normal left subclavian artery..  . Chronic back pain    HNP  . Chronic kidney disease (CKD)   . Claustrophobia    takes Ativan as needed  . 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 Coronary atherosclerosis of unspecified type of vessel, native or graft    a. cath in 2003 showed 10-20% LM, scattered 20% prox-mid LAD, 30% more distal LAD, 50-60% stenosis of LAD towards apex, 20% Cx, 30% dRCA, focal 95% stenosis in smaller of 2 branches of PDA, EF 60-65%.  . Diabetes mellitus ([place])   . Diarrhea    takes Imodium daily as needed  . Diverticulosis     . Diverticulosis of colon (without mention of hemorrhage)   . DJD (degenerative joint disease)   . DVT (deep venous thrombosis) ([place]) 07/2011  . Esophageal stricture   . Essential hypertension    takes Imdur and Lisinopril daily  . Gastric polyps    benign  . GERD (gastroesophageal reflux disease)    takes Nexium daily  . Glaucoma    uses eye drops  . History of bronchitis    not sure when the last time  . History of colon polyps    benign  . History of kidney stones   . History of kidney stones   . Insomnia    takes Melatonin nightly as needed  . Mixed hyperlipidemia    takes Crestor daily  . Myasthenia gravis ([place])    uses prednisone  . Osteoporosis 2016  . Peripheral edema    takes Lasix daily  . Pituitary tumor    checks [facility]Walden at [facility] checks it every Jan   . Pneumonia 01/2016  . PONV (postoperative nausea and vomiting)   . Ptosis   . Pulmonary embolus ([place]) 2012  . Stroke [facility]) 06/2015    Family History  Problem Relation Age of Onset  . Heart disease Mother   . Heart attack Mother   . Stroke Mother   . Heart disease Father   . Heart attack Father   . Stroke Father   . Breast  cancer Sister        Twin   . Heart attack Sister   . Dementia Sister   . Heart disease Sister   . Heart attack Sister   . Clotting disorder Sister   . Heart attack Sister   . Colon cancer Neg Hx     SOCIAL HISTORY: Social History   Tobacco Use  . Smoking status: Never Smoker  . Smokeless tobacco: Never Used  Substance Use Topics  . Alcohol use: No    Alcohol/week: 0.0 standard drinks    Allergies  Allergen Reactions  . Phenergan [Promethazine Hcl] Swelling and Other (See Comments)    Cannot be combined with Zofran because swelling of the eyes resulted and patient became very restless-  . Zofran [Ondansetron Hcl] Swelling and Other (See Comments)    Cannot be combined with Phenergan because swelling of the eyes resulted and patient became very restless-  . Other  Other (See Comments)    Regarding scans, the PATIENT IS CLAUSTROPHOBIC!!!  . Sulfamethoxazole Rash and Itching    No current facility-administered medications for this encounter.    Current Outpatient Medications  Medication Sig Dispense Refill  . brimonidine-timolol (COMBIGAN) 0.2-0.5 % ophthalmic solution Place 1 drop into both eyes 2 (two) times daily.     . cholecalciferol (VITAMIN D) 1000 units tablet Take 1,000 Units by mouth daily.     . clopidogrel (PLAVIX) 75 MG tablet Take 1 tablet (75 mg total) by mouth daily. 30 tablet 6  . ELIQUIS 5 MG TABS tablet TAKE 1 TABLET (5 MG TOTAL) BY MOUTH 2 (TWO) TIMES DAILY. 60 tablet 3  . furosemide (LASIX) 40 MG tablet Take 1 tablet (40 mg total) by mouth daily. 90 tablet 3  . imatinib (GLEEVEC) 100 MG tablet Take 300 mg by mouth daily with lunch. Take with noon meal and a large glass of water.Caution:Chemotherapy    . isosorbide mononitrate (IMDUR) 30 MG 24 hr tablet Take 1 tablet (30 mg total) by mouth daily. 90 tablet 3  . lidocaine (LIDODERM) 5 % Place 1 patch onto the skin daily as needed (for back pain). Remove & Discard patch within 12 hours or as directed by MD     . lisinopril (PRINIVIL,ZESTRIL) 10 MG tablet Take 1 tablet (10 mg total) by mouth daily. 30 tablet 11  . loperamide (IMODIUM) 2 MG capsule Take 2-4 mg by mouth 3 (three) times daily as needed for diarrhea or loose stools.     . Multiple Vitamin (MULTIVITAMIN) tablet Take 1 tablet by mouth daily.    Marland Kitchen oxyCODONE (OXY IR/ROXICODONE) 5 MG immediate release tablet Take 1 tablet (5 mg total) by mouth every 6 (six) hours as needed for breakthrough pain. 25 tablet 0  . oxyCODONE-acetaminophen (PERCOCET/ROXICET) 5-325 MG tablet Take 1 tablet by mouth every 6 (six) hours as needed.  0  . potassium chloride (K-DUR) 10 MEQ tablet Take 1 tablet (10 mEq total) by mouth daily. 90 tablet 3  . predniSONE (DELTASONE) 1 MG tablet TAKE 3 TABLETS BY MOUTH ONCE DAILY WITH BREAKFAST 270 tablet 0  .  pyridostigmine (MESTINON) 60 MG tablet Take 60 mg by mouth daily.    . rosuvastatin (CRESTOR) 10 MG tablet Take 5 mg by mouth daily.    . traMADol (ULTRAM) 50 MG tablet Take 1 tablet (50 mg total) by mouth every 6 (six) hours as needed for moderate pain or severe pain. 15 tablet 0    REVIEW OF SYSTEMS:  [X]  denotes positive finding, [ ]   denotes negative finding Cardiac  Comments:  Chest pain or chest pressure:    Shortness of breath upon exertion: x   Short of breath when lying flat:    Irregular heart rhythm:        Vascular    Pain in calf, thigh, or hip brought on by ambulation: x   Pain in feet at night that wakes you up from your sleep:  x   Blood clot in your veins:    Leg swelling:           PHYSICAL EXAM: Vitals:   02/05/18 1118 02/05/18 1236  BP: (!) 102/56 (!) 126/50  Pulse: 88 81  Resp: 16 16  Temp: 98 F (36.7 C)   TempSrc: Oral   SpO2: 100% 99%    GENERAL: The patient is a well-nourished male, in no acute distress. The vital signs are documented above. CARDIOVASCULAR: Palpable femoral pulses bilaterally.  Left groin incision well-healed.  Absent popliteal and distal pulses bilaterally PULMONARY: There is good air exchange  MUSCULOSKELETAL: There are no major deformities or cyanosis. NEUROLOGIC: No focal weakness or paresthesias are detected. SKIN: Right foot is cold.  His toes are all cadaveric and white.  Does have erythema on the dorsum of his foot.  No tenderness in his calf. PSYCHIATRIC: The patient has a normal affect.  DATA:  Does have monophasic Doppler flow in the anterior tibial and the peroneal artery above the ankle  MEDICAL ISSUES: Nonviable right foot with known severe tibial occlusive disease.  White count not elevated but does have erythema.  Pain is unmanageable.  I had a very long discussion with the patient and his family present.  No option other than amputation.  I discussed below-knee versus above-knee amputation.  Explained that he would  have a very high chance of success for healing of above-knee with moderate with below-knee due to his popliteal occlusive disease.  Explained that we would make this decision prior to surgery.  The will be admitted by the hospitalist for pain management and antibiotics and we will follow.  Plan surgery on Monday with Dr. Demetrios Isaacs, MD [facility] [phone] Pager [phone]

## 2018-02-05 NOTE — ED Notes (Signed)
After pain medication, patient noted to have O2 saturation readings down as low as 83%, easily awakened and placed on 2L. Improved to 95%.

## 2018-02-05 NOTE — Progress Notes (Addendum)
Pharmacy Antibiotic Note  [name] is a 77 y.o. male admitted on 02/05/2018 with cellulitis.  Pharmacy has been consulted for Vancomycin dosing. LA 2.49. WBC 11. Afebrile. Patient has had chronic R-foot wound and has history of DM. No cultures. AKI- SCr 2.33 estimated CrCl ~24 mL/min (baseline SCr ~1.8). Vancomycin 1g given in ED.  Plan: Vancomycin 500mg  IV every 24 hours.  If SCr worsens, will adjust.  Will monitor renal function, clinical status, and culture results.      Temp (24hrs), Avg:98 F (36.7 C), Min:98 F (36.7 C), Max:98 F (36.7 C)  Recent Labs  Lab 02/05/18 1149 02/05/18 1150  WBC 11.0*  --   CREATININE 2.33*  --   LATICACIDVEN  --  2.49*    Estimated Creatinine Clearance: 24 mL/min (A) (by C-G formula based on SCr of 2.33 mg/dL (H)).    Allergies  Allergen Reactions  . Phenergan [Promethazine Hcl] Swelling and Other (See Comments)    Cannot be combined with Zofran because swelling of the eyes resulted and patient became very restless-  . Zofran [Ondansetron Hcl] Swelling and Other (See Comments)    Cannot be combined with Phenergan because swelling of the eyes resulted and patient became very restless-  . Other Other (See Comments)    Regarding scans, the PATIENT IS CLAUSTROPHOBIC!!!  . Sulfamethoxazole Rash and Itching    Antimicrobials this admission: Vancomycin 12/28 >>  Dose adjustments this admission:   Microbiology results:  Thank you for allowing pharmacy to be a part of this patient's care.  Brain Hilts 02/05/2018 2:48 PM

## 2018-02-05 NOTE — H&P (Signed)
History and Physical    [name] HCW:[id number] DOB: Nov 13, 1940 DOA: 02/05/2018  PCP: Libby Maw, MD  Patient coming from: Home  Chief Complaint: Right leg pain  HPI: [name] is a 77 y.o. 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  [name] has known PVD of the lower extremities who underwent stenting X 3 of the right extremity on 12/23/17.  Since that time he has had persistent pain and worsening of symptoms in the right foot.  Further symptoms include decreased PO intake, nausea and 1 day of vomiting up all food.  He has been taking his plavix and eliquis as directed.  He was seen by Dr. Donnetta Hutching in the ED who recommended amputation and this will be scheduled for Monday.  He noted that the pain in his leg is unmanageable and makes it difficult for him to walk.   ED Course: In the ED, he was found to have a Cr of 2.33 (within recent baseline since last procedure), lactate of 2.49, WBC of 11 and H/H of 11.8/35.7 (chronic normocytic anemia).  He was assessed by Vascular Surgery.   Review of Systems: As per HPI otherwise 10 point review of systems negative.   Past Medical History:  Diagnosis Date  . Bruises easily    d/t being on Eliquis  . Carotid artery disease ([place])    a. Duplex 05/2014: 17-61% RICA, 6-07% LICA, elevated velocities in right subclavian artery, normal left subclavian artery..  . Chronic back pain    HNP  . Chronic kidney disease (CKD)   . Claustrophobia    takes Ativan as needed  . 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 Coronary atherosclerosis of unspecified type of vessel, native or graft    a. cath in 2003 showed 10-20% LM, scattered 20% prox-mid LAD, 30% more distal LAD, 50-60% stenosis of LAD towards apex, 20% Cx, 30% dRCA, focal 95% stenosis in smaller of 2 branches of PDA, EF 60-65%.   . Diabetes mellitus ([place])   . Diarrhea    takes Imodium daily as needed  . Diverticulosis   . Diverticulosis of colon (without mention of hemorrhage)   . DJD (degenerative joint disease)   . DVT (deep venous thrombosis) ([place]) 07/2011  . Esophageal stricture   . Essential hypertension    takes Imdur and Lisinopril daily  . Gastric polyps    benign  . GERD (gastroesophageal reflux disease)    takes Nexium daily  . Glaucoma    uses eye drops  . History of bronchitis    not sure when the last time  . History of colon polyps    benign  . History of kidney stones   . History of kidney stones   . Insomnia    takes Melatonin nightly as needed  . Mixed hyperlipidemia    takes Crestor daily  . Myasthenia gravis ([place])    uses prednisone  . Osteoporosis 2016  . Peripheral edema    takes Lasix daily  . Pituitary tumor    [facility] Dr[facility]den at [facility] checks it every Jan   . Pneumonia 01/2016  . PONV (postoperative nausea and vomiting)   . Ptosis   . Pulmonary embolus ([place]) 2012  . Stroke [facility]) 06/2015    Past Surgical History:  Procedure Laterality Date  . ABDOMINAL AORTOGRAM W/LOWER EXTREMITY  N/A 11/18/2016   Procedure: ABDOMINAL AORTOGRAM W/LOWER EXTREMITY;  Surgeon: [name], MD;  Location: [place] [facility];  Service: Cardiovascular;  Laterality: N/A;  . AORTOGRAM Right 12/23/2017   Procedure: AORTOGRAM, RIGHT LOWER EXTREMITY ANGIOGRAM, RIGHT SUPERFICIAL FEMORAL ARTERY ANGIOPLASTY WITH STENT, RIGHT POPLITEAL ARTERY ANGIOPLASTY WITH STENT, LEFT GROIN CUTDOWN AND LEFT FEMORAL ARTERY REPAIR;  Surgeon: [name], MD;  Location: [place];  Service: Vascular;  Laterality: Right;  . BACK SURGERY     x2, lumbar and cervical  . CARDIAC CATHETERIZATION    . CATARACT EXTRACTION Bilateral 12/12  . COLONOSCOPY    . ESOPHAGOGASTRODUODENOSCOPY ENDOSCOPY  04/2015  . IR GENERIC HISTORICAL  03/03/2016   IR IVC FILTER PLMT / S&I [name] GUID/MOD SED 03/03/2016 [name], MD  [facility]  . IR GENERIC HISTORICAL  04/09/2016   IR RADIOLOGIST EVAL & MGMT 04/09/2016 [name], MD [facility] INTER[facility]  . IR GENERIC HISTORICAL  04/30/2016   IR IVC FILTER RETRIEVAL / S&I [name] GUID/MOD SED 04/30/2016 [name], MD [facility]-INTERV RAD  . LITHOTRIPSY    . LUMBAR LAMINECTOMY/DECOMPRESSION MICRODISCECTOMY Right 09/14/2012   Procedure: Right Lumbar three-four, four-five, Lumbar five-Sacral one decompressive laminectomy;  Surgeon: [name], MD;  Location: [place] [facility];  Service: Neurosurgery;  Laterality: Right;  . LUMBAR LAMINECTOMY/DECOMPRESSION MICRODISCECTOMY Left 03/11/2016   Procedure: Left Lumbar Two-ThreeMicrodiscectomy;  Surgeon: [name], MD;  Location: [place];  Service: Neurosurgery;  Laterality: Left;  . PERIPHERAL VASCULAR INTERVENTION  11/18/2016   Procedure: PERIPHERAL VASCULAR INTERVENTION;  Surgeon: [name], MD;  Location: [place] CV [facility];  Service: Cardiovascular;;  Left popliteal  . SHOULDER SURGERY Left 05/07/2009  . TONSILLECTOMY       reports that he has never smoked. He has never used smokeless tobacco. He reports that he does not drink alcohol or use drugs.  Allergies  Allergen Reactions  . Other Other (See Comments)    Regarding scans, the PATIENT IS CLAUSTROPHOBIC!!!  . Phenergan [Promethazine Hcl] Swelling and Other (See Comments)    Cannot be combined with Zofran because swelling of the eyes resulted and patient became very restless-  . Zofran [Ondansetron Hcl] Swelling and Other (See Comments)    Cannot be combined with Phenergan because swelling of the eyes resulted and patient became very restless-  . Sulfamethoxazole Rash and Itching   Reviewed with patient.  Family History  Problem Relation Age of Onset  . Heart disease Mother   . Heart attack Mother   . Stroke Mother   . Heart disease Father   . Heart attack Father   . Stroke Father   . Breast cancer Sister        Twin   . Heart attack Sister   . Dementia Sister     . Heart disease Sister   . Heart attack Sister   . Clotting disorder Sister   . Heart attack Sister   . Colon cancer Neg Hx      Prior to Admission medications   Medication Sig Start Date End Date Taking? Authorizing Provider  brimonidine-timolol (COMBIGAN) 0.2-0.5 % ophthalmic solution Place 1 drop into both eyes 2 (two) times daily.    Yes [provider]  cholecalciferol (VITAMIN D) 1000 units tablet Take 1,000 Units by mouth daily.    Yes [provider]  clopidogrel (PLAVIX) 75 MG tablet Take 1 tablet (75 mg total) by mouth daily. 01/11/18  Yes [name], MD  ELIQUIS 5 MG  TABS tablet TAKE 1 TABLET (5 MG TOTAL) BY MOUTH 2 (TWO) TIMES DAILY. 12/09/15  Yes Kathrynn Ducking, MD  furosemide (LASIX) 40 MG tablet Take 1 tablet (40 mg total) by mouth daily. 11/26/17 11/21/18 Yes Sherren Mocha, MD  imatinib (GLEEVEC) 100 MG tablet Take 300 mg by mouth daily with lunch. Take with noon meal and a large glass of water.Caution:Chemotherapy   Yes [provider]  isosorbide mononitrate (IMDUR) 30 MG 24 hr tablet Take 1 tablet (30 mg total) by mouth daily. 09/17/15  Yes Sherren Mocha, MD  lidocaine (LIDODERM) 5 % Place 1 patch onto the skin daily as needed (for back pain). Remove & Discard patch within 12 hours or as directed by MD    Yes [provider]  lisinopril (PRINIVIL,ZESTRIL) 10 MG tablet Take 1 tablet (10 mg total) by mouth daily. 09/27/17  Yes Renato Shin, MD  loperamide (IMODIUM) 2 MG capsule Take 2-4 mg by mouth 3 (three) times daily as needed for diarrhea or loose stools.    Yes [provider]  Multiple Vitamin (MULTIVITAMIN) tablet Take 1 tablet by mouth daily.   Yes [provider]  oxyCODONE (OXY IR/ROXICODONE) 5 MG immediate release tablet Take 1 tablet (5 mg total) by mouth every 6 (six) hours as needed for breakthrough pain. 01/11/18  Yes [name], MD  potassium chloride (K-DUR) 10 MEQ tablet Take 1  tablet (10 mEq total) by mouth daily. 11/26/17 11/21/18 Yes Sherren Mocha, MD  predniSONE (DELTASONE) 1 MG tablet TAKE 3 TABLETS BY MOUTH ONCE DAILY WITH BREAKFAST Patient taking differently: Take 3 mg by mouth daily with breakfast.  01/18/18  Yes Kathrynn Ducking, MD  pyridostigmine (MESTINON) 60 MG tablet Take 60 mg by mouth daily.   Yes [provider]  rosuvastatin (CRESTOR) 10 MG tablet Take 5 mg by mouth daily.   Yes [provider]  traMADol (ULTRAM) 50 MG tablet Take 1 tablet (50 mg total) by mouth every 6 (six) hours as needed for moderate pain or severe pain. 01/30/18  Yes Domenic Moras, PA-C  oxyCODONE-acetaminophen (PERCOCET/ROXICET) 5-325 MG tablet Take 1 tablet by mouth every 6 (six) hours as needed (for pain).  12/13/17   [provider]    Physical Exam:  Constitutional: NAD, calm, comfortable Vitals:   02/05/18 1345 02/05/18 1400 02/05/18 1515 02/05/18 1630  BP: (!) 126/44 (!) 123/48 (!) 110/48 (!) 123/47  Pulse: 83 84 85 85  Resp:      Temp:      TempSrc:      SpO2: (!) 89% 100% 100% 93%   Eyes: He has mild ptosis of the left eye.  He has tearing of both eyes and conjunctival injection of both eyes.  ENMT: Mucous membranes are mildly dry. Normal dentition.  Neck: normal, supple, no masses Respiratory: CTAB, no wheezing, no crackles Cardiovascular: RR, NR, no murmur - no palpable pulses in DP bilaterally Abdomen: +BS, NT, ND Musculoskeletal: He has some swelling of the right foot with discoloration, right foot is cold to the touch with whitening of toes.  He has black discoloration on 2nd toe Skin: skin changes noted including erythema of the dorsum of the right foot.  He has chronic discoloration of hands/arms he reports is bruising.  Neurologic: Grossly intact, moving all extremities.  He has chronic ptosis.  Psychiatric: Normal judgment and insight. Alert and oriented x 3. Normal mood.   Labs on Admission: I have personally reviewed  following labs and imaging studies  CBC: Recent Labs  Lab 02/05/18 1149  WBC 11.0*  NEUTROABS 7.8*  HGB 11.8*  HCT 35.7*  MCV 94.2  PLT 734   Basic Metabolic Panel: Recent Labs  Lab 02/05/18 1149  NA 137  K 3.7  CL 96*  CO2 25  GLUCOSE 113*  BUN 16  CREATININE 2.33*  CALCIUM 9.3   GFR: Estimated Creatinine Clearance: 24 mL/min (A) (by C-G formula based on SCr of 2.33 mg/dL (H)). Liver Function Tests: Recent Labs  Lab 02/05/18 1149  AST 34  ALT 17  ALKPHOS 63  BILITOT 0.9  PROT 6.2*  ALBUMIN 3.6   No results for input(s): LIPASE, AMYLASE in the last 168 hours. No results for input(s): AMMONIA in the last 168 hours. Coagulation Profile: Recent Labs  Lab 02/05/18 1237  INR 1.39   Cardiac Enzymes: No results for input(s): CKTOTAL, CKMB, CKMBINDEX, TROPONINI in the last 168 hours. BNP (last 3 results) Recent Labs    10/06/17 1139  PROBNP 73.0   HbA1C: No results for input(s): HGBA1C in the last 72 hours. CBG: Recent Labs  Lab 02/05/18 1128  GLUCAP 102*   Lipid Profile: No results for input(s): CHOL, HDL, LDLCALC, TRIG, CHOLHDL, LDLDIRECT in the last 72 hours. Thyroid Function Tests: No results for input(s): TSH, T4TOTAL, FREET4, T3FREE, THYROIDAB in the last 72 hours. Anemia Panel: No results for input(s): VITAMINB12, FOLATE, FERRITIN, TIBC, IRON, RETICCTPCT in the last 72 hours. Urine analysis:    Component Value Date/Time   COLORURINE YELLOW 10/30/2017 2024   APPEARANCEUR CLEAR 10/30/2017 2024   LABSPEC 1.021 10/30/2017 2024   PHURINE 6.0 10/30/2017 2024   GLUCOSEU NEGATIVE 10/30/2017 2024   GLUCOSEU NEGATIVE 04/09/2014 0910   HGBUR NEGATIVE 10/30/2017 2024   BILIRUBINUR NEGATIVE 10/30/2017 2024   KETONESUR NEGATIVE 10/30/2017 2024   PROTEINUR NEGATIVE 10/30/2017 2024   UROBILINOGEN 1.0 04/09/2014 0910   NITRITE NEGATIVE 10/30/2017 2024   LEUKOCYTESUR NEGATIVE 10/30/2017 2024    Radiological Exams on Admission: No results  found.  Assessment/Plan Ischemic right foot with history of PAD (peripheral artery disease)  - plan for surgery on Monday, BKA vs. AKA - Will need to be NPO Sunday night - Plan for anticoagulation and antiplatelet - hold plavix in anticipation of surgery.  He has CAD which is noted to be treated medically.  Hold Eliquis and switch to IV heparin in anticipation of surgery.   Apixaban's half life is 8-12 hours, should be held 2-3 days prior to surgery.  Will discontinue at this time.  Given reasoning for anticoagulation (repeat VTE) will transition to IV heparin which will need to be stopped 4-6 hours prior to procedure.  - Antibiotics have been started prior to surgery, Rocephin and Vancomycin.  These will continue until source control - For fever, consider Blood culture and broadening antibiotics - pain control with Morphine and hydrocodone, he has requested no oxycodone as it makes him sick.     Chronic myeloid leukemia in remission - Has been on Gleevec for 15 years, continue  Chronic kidney disease - Cr is mildly worse, but this may be a new baseline after stenting procedure in November - Will start fluids as he has not been taking very much PO at a slow rate given h/o diastolic HFpEF - NS at 19FX/TK for 12 hours - Will plan to halve his lasix dose    Essential hypertension - BP has been on the low side in the ED, hold IMDUR, lisinopril, restart if needed  CAD (coronary artery disease) - Hold plavix as noted above - Restart IMDUR and lisinopril if BP improves    Diabetes mellitus type 2 - Currently on no meds - SSI    History of pulmonary embolism and History of DVT (deep vein thrombosis) - See discussion re anticoagulation above, pharmacy consult for heparin gtt as a bridge prior to surgery    Ocular myasthenia - Continue mestinon and chronic steroids - he has been on chronic steroids for a long time, he may benefit from stress dose steroids.  However, he is no < 5mg /day of  steroids, which should mean he is okay to not have stress dose steroids.  Given his complicated medical history, will check an AM cortisol to see if his HPA axis is suppressed.  This will help guide therapy.   HLD (hyperlipidemia) - Continue statin     DVT prophylaxis: On Eliquis, switched to heparin in anticipation of surgery Code Status: Full Family Communication: Wife, Daughter and Son in Sports coach at bedside  Disposition Plan: Admit for planned surgery Monday  Consults called: Dr. Donnetta Hutching, Vascular Surgery Admission status: Inpatient, med-surg    Gilles Chiquito MD [facility] Pager [phone]  If 7PM-7AM, please contact night-coverage [email or web address] Password [facility]  02/05/2018, 4:38 PM

## 2018-02-05 NOTE — Progress Notes (Addendum)
ANTICOAGULATION CONSULT NOTE - Initial Consult  Pharmacy Consult for heparin Indication: VTE treatment  Allergies  Allergen Reactions  . Other Other (See Comments)    Regarding scans, the PATIENT IS CLAUSTROPHOBIC!!!  . Phenergan [Promethazine Hcl] Swelling and Other (See Comments)    Cannot be combined with Zofran because swelling of the eyes resulted and patient became very restless-  . Zofran [Ondansetron Hcl] Swelling and Other (See Comments)    Cannot be combined with Phenergan because swelling of the eyes resulted and patient became very restless-  . Sulfamethoxazole Rash and Itching    Patient Measurements: Wt: 77.6 kg Ht: 5'7" Heparin Dosing Weight: 77 kg  Vital Signs: Temp: 98 F (36.7 C) (12/28 1118) Temp Source: Oral (12/28 1118) BP: 129/41 (12/28 1720) Pulse Rate: 80 (12/28 1717)  Labs: Recent Labs    02/05/18 1149 02/05/18 1237  HGB 11.8*  --   HCT 35.7*  --   PLT 318  --   LABPROT  --  16.9*  INR  --  1.39  CREATININE 2.33*  --     Estimated Creatinine Clearance: 24 mL/min (A) (by C-G formula based on SCr of 2.33 mg/dL (H)).   Medical History: Past Medical History:  Diagnosis Date  . Bruises easily    d/t being on Eliquis  . Carotid artery disease ([place])    a. Duplex 05/2014: 78-58% RICA, 8-50% LICA, elevated velocities in right subclavian artery, normal left subclavian artery..  . Chronic back pain    HNP  . Chronic kidney disease (CKD)   . Claustrophobia    takes Ativan as needed  . 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 Coronary atherosclerosis of unspecified type of vessel, native or graft    a. cath in 2003 showed 10-20% LM, scattered 20% prox-mid LAD, 30% more distal LAD, 50-60% stenosis of LAD towards apex, 20% Cx, 30% dRCA, focal 95% stenosis in smaller of 2 branches of PDA, EF 60-65%.  . Diabetes mellitus ([place])   . Diarrhea    takes Imodium daily as needed  . Diverticulosis   . Diverticulosis of colon  (without mention of hemorrhage)   . DJD (degenerative joint disease)   . DVT (deep venous thrombosis) ([place]) 07/2011  . Esophageal stricture   . Essential hypertension    takes Imdur and Lisinopril daily  . Gastric polyps    benign  . GERD (gastroesophageal reflux disease)    takes Nexium daily  . Glaucoma    uses eye drops  . History of bronchitis    not sure when the last time  . History of colon polyps    benign  . History of kidney stones   . History of kidney stones   . Insomnia    takes Melatonin nightly as needed  . Mixed hyperlipidemia    takes Crestor daily  . Myasthenia gravis ([place])    uses prednisone  . Osteoporosis 2016  . Peripheral edema    takes Lasix daily  . Pituitary tumor    checks Dr.Walden at [facility] checks it every Jan   . Pneumonia 01/2016  . PONV (postoperative nausea and vomiting)   . Ptosis   . Pulmonary embolus ([place]) 2012  . Stroke ([place]) 06/2015   Assessment: 61 yom presenting with progressive pain/ischemia in R foot. He is on apixaban PTA (LD 12/28@0800 ). Plan currently for BKA vs AKA surgery on Monday- apixaban on hold and will bridge with heparin infusion.  Hgb 11.8, plt 318. Scr 2.33 (CrCl 24 mL/min). No s/sx of bleeding. Will monitor both aPTT and heparin level until correlate given recent apixaban dosing.   Goal of Therapy:  Heparin level 0.3-0.7 units/ml aPTT 66-102 seconds Monitor platelets by anticoagulation protocol: Yes   Plan:  Start heparin infusion at 1200 units/hr on 12/28@2000  Check anti-Xa/aPTT level in 8 hours and daily while on heparin Continue to monitor H&H and platelets  Antonietta Jewel, PharmD, [place] Clinical Pharmacist  Pager: [phone] Phone: [phone] 02/05/2018,5:25 PM

## 2018-02-06 LAB — GLUCOSE, CAPILLARY
Glucose-Capillary: 111 mg/dL — ABNORMAL HIGH (ref 70–99)
Glucose-Capillary: 143 mg/dL — ABNORMAL HIGH (ref 70–99)

## 2018-02-06 LAB — BASIC METABOLIC PANEL
ANION GAP: 14 (ref 5–15)
BUN: 14 mg/dL (ref 8–23)
CO2: 26 mmol/L (ref 22–32)
Calcium: 8.3 mg/dL — ABNORMAL LOW (ref 8.9–10.3)
Chloride: 97 mmol/L — ABNORMAL LOW (ref 98–111)
Creatinine, Ser: 1.89 mg/dL — ABNORMAL HIGH (ref 0.61–1.24)
GFR calc Af Amer: 38 mL/min — ABNORMAL LOW (ref >60)
GFR calc non Af Amer: 33 mL/min — ABNORMAL LOW (ref >60)
Glucose, Bld: 85 mg/dL (ref 70–99)
POTASSIUM: 3.5 mmol/L (ref 3.5–5.1)
Sodium: 137 mmol/L (ref 135–145)

## 2018-02-06 LAB — APTT
APTT: 164 s — AB (ref 24–36)
aPTT: 171 seconds (ref 24–36)
aPTT: 128 seconds — ABNORMAL HIGH (ref 24–36)

## 2018-02-06 LAB — CBC
HCT: 30 % — ABNORMAL LOW (ref 39.0–52.0)
Hemoglobin: 10 g/dL — ABNORMAL LOW (ref 13.0–17.0)
MCH: 31.3 pg (ref 26.0–34.0)
MCHC: 33.3 g/dL (ref 30.0–36.0)
MCV: 93.8 fL (ref 80.0–100.0)
NRBC: 0 % (ref 0.0–0.2)
Platelets: 265 10*3/uL (ref 150–400)
RBC: 3.2 MIL/uL — AB (ref 4.22–5.81)
RDW: 13.8 % (ref 11.5–15.5)
WBC: 7.9 10*3/uL (ref 4.0–10.5)

## 2018-02-06 LAB — SURGICAL PCR SCREEN
MRSA, PCR: NEGATIVE
Staphylococcus aureus: NEGATIVE

## 2018-02-06 LAB — HEPARIN LEVEL (UNFRACTIONATED): Heparin Unfractionated: >2.2 IU/mL — ABNORMAL HIGH (ref 0.30–0.70)

## 2018-02-06 LAB — CORTISOL-AM, BLOOD: Cortisol - AM: 6 ug/dL — ABNORMAL LOW (ref 6.7–22.6)

## 2018-02-06 MED ORDER — HYDROCORTISONE NA SUCCINATE PF 100 MG IJ SOLR
50.0000 mg | Freq: Three times a day (TID) | INTRAMUSCULAR | Status: DC
  Administered 2018-02-07 – 2018-02-08 (×4): 50 mg via INTRAVENOUS
  Filled 2018-02-06 (×4): qty 2

## 2018-02-06 MED ORDER — ONDANSETRON HCL 4 MG/2ML IJ SOLN
4.0000 mg | Freq: Four times a day (QID) | INTRAMUSCULAR | Status: DC | PRN
  Administered 2018-02-06 (×3): 4 mg via INTRAVENOUS
  Filled 2018-02-06 (×3): qty 2

## 2018-02-06 MED ORDER — BRIMONIDINE TARTRATE 0.2 % OP SOLN
1.0000 [drp] | Freq: Two times a day (BID) | OPHTHALMIC | Status: DC
  Administered 2018-02-06 – 2018-02-10 (×9): 1 [drp] via OPHTHALMIC
  Filled 2018-02-06: qty 5

## 2018-02-06 MED ORDER — DIPHENHYDRAMINE HCL 50 MG/ML IJ SOLN
12.5000 mg | Freq: Once | INTRAMUSCULAR | Status: AC
  Administered 2018-02-06: 12.5 mg via INTRAVENOUS
  Filled 2018-02-06: qty 1

## 2018-02-06 MED ORDER — TRAZODONE HCL 50 MG PO TABS: 50.0000 mg | ORAL_TABLET | Freq: Once | ORAL | Status: DC

## 2018-02-06 MED ORDER — INSULIN ASPART 100 UNIT/ML ~~LOC~~ SOLN
0.0000 [IU] | Freq: Three times a day (TID) | SUBCUTANEOUS | Status: DC
  Administered 2018-02-06: 1 [IU] via SUBCUTANEOUS
  Administered 2018-02-07 – 2018-02-08 (×3): 2 [IU] via SUBCUTANEOUS
  Administered 2018-02-08: 3 [IU] via SUBCUTANEOUS
  Administered 2018-02-09 (×2): 1 [IU] via SUBCUTANEOUS
  Administered 2018-02-09: 2 [IU] via SUBCUTANEOUS

## 2018-02-06 MED ORDER — TIMOLOL MALEATE 0.5 % OP SOLN
1.0000 [drp] | Freq: Two times a day (BID) | OPHTHALMIC | Status: DC
  Administered 2018-02-06 – 2018-02-10 (×9): 1 [drp] via OPHTHALMIC
  Filled 2018-02-06: qty 5

## 2018-02-06 NOTE — Progress Notes (Signed)
[place] for heparin Indication: VTE treatment  Allergies  Allergen Reactions  . Other Other (See Comments)    Regarding scans, the PATIENT IS CLAUSTROPHOBIC!!!  . Phenergan [Promethazine Hcl] Swelling and Other (See Comments)    Cannot be combined with Zofran because swelling of the eyes resulted and patient became very restless-  . Zofran [Ondansetron Hcl] Swelling and Other (See Comments)    Cannot be combined with Phenergan because swelling of the eyes resulted and patient became very restless-  . Sulfamethoxazole Rash and Itching    Patient Measurements: Wt: 77.6 kg Ht: 5'7" Heparin Dosing Weight: 77 kg  Vital Signs: Temp: 98.4 F (36.9 C) (12/29 1322) Temp Source: Oral (12/29 1322) BP: 127/59 (12/29 1322)  Labs: Recent Labs    02/05/18 1149 02/05/18 1237 02/06/18 0348 02/06/18 1249  HGB 11.8*  --  10.0*  --   HCT 35.7*  --  30.0*  --   PLT 318  --  265  --   APTT  --   --  171* 128*  LABPROT  --  16.9*  --   --   INR  --  1.39  --   --   HEPARINUNFRC  --   --  >2.20*  --   CREATININE 2.33*  --  1.89*  --     Estimated Creatinine Clearance: 29.6 mL/min (A) (by C-G formula based on SCr of 1.89 mg/dL (H)).  Assessment: 27 yom presenting with progressive pain/ischemia in R foot. He is on apixaban PTA (LD 12/28@0800 ). Plan currently for BKA vs AKA surgery on Monday- apixaban on hold and will bridge with heparin infusion.    APTT came back supratherapeutic at 128, on 900 units/hr. Hgb 10, plt 265. No s/sx of bleeding. No infusion issues.   Goal of Therapy:  Heparin level 0.3-0.7 units/ml aPTT 66-102 seconds Monitor platelets by anticoagulation protocol: Yes   Plan:  Decrease heparin drip to 700 units/hr Check anti-Xa/aPTT level in 6-8 hours and daily while on heparin Continue to monitor H&H and platelets  Thanks for allowing pharmacy to be a part of this patient's care.  Antonietta Jewel, PharmD, [place] Clinical  Pharmacist  Pager: [phone] Phone: [phone]

## 2018-02-06 NOTE — Progress Notes (Signed)
CRITICAL VALUE ALERT  Critical Value:  APTT 171  Date & Time Notied:  12/29 0600  Provider Notified: pharmacy  Orders Received/Actions taken: heparin decreased to 53ml/hr (see pharmacy note)

## 2018-02-06 NOTE — Progress Notes (Addendum)
[facility] PROGRESS NOTE  [name] SIVLEY ZJI:[id number] DOB: 1940-06-01 DOA: 02/05/2018  PCP: Libby Maw, MD  Brief History/Interval Summary: 77 y.o. 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  Mr. Latterell has known PVD of the lower extremities who underwent stenting X 3 of the right extremity on 12/23/17.  Since that time he has had persistent pain and worsening of symptoms in the right foot.    Patient was found findings suggestive of ischemia.  Patient seen by vascular surgery.  Plan is for amputation on Monday.    Reason for Visit: Ischemic right lower extremity  Consultants: Vascular surgery  Procedures: None yet  Antibiotics: Vancomycin and ceftriaxone  Subjective/Interval History: Patient complains of pain in the right leg 10 out of 10 in intensity at times.  Denies any shortness of breath.  Some nausea this morning.  ROS: Denies any abdominal pain.  Objective:  Vital Signs  Vitals:   02/05/18 1750 02/05/18 1950 02/06/18 0416 02/06/18 0833  BP:  (!) 147/52 (!) 144/54 132/62  Pulse:  80    Resp:  12 18   Temp:  98.5 F (36.9 C) 98.3 F (36.8 C)   TempSrc:  Oral Oral   SpO2:  99% 95%   Weight: 74.1 kg  74.5 kg   Height: 5\' 7"  (1.702 m)       Intake/Output Summary (Last 24 hours) at 02/06/2018 1041 Last data filed at 02/06/2018 0840 Gross per 24 hour  Intake 1392 ml  Output 245 ml  Net 1147 ml   Filed Weights   02/05/18 1720 02/05/18 1750 02/06/18 0416  Weight: 77 kg 74.1 kg 74.5 kg    General appearance: alert, cooperative, appears stated age and no distress Resp: clear to auscultation bilaterally Cardio: regular rate and rhythm, S1, S2 normal, no murmur, click, rub or gallop GI: soft, non-tender; bowel sounds normal; no masses,  no organomegaly Extremities: Changes suggestive of ischemia noted in  the right foot.  Foot is cold to touch.  Very poor pulses.  Gangrenous changes noted over the second toe.  Foul smell is present. Neurologic: Alert and oriented x3.  No focal neurological deficits.  Lab Results:  Data Reviewed: I have personally reviewed following labs and imaging studies  CBC: Recent Labs  Lab 02/05/18 1149 02/06/18 0348  WBC 11.0* 7.9  NEUTROABS 7.8*  --   HGB 11.8* 10.0*  HCT 35.7* 30.0*  MCV 94.2 93.8  PLT 318 175    Basic Metabolic Panel: Recent Labs  Lab 02/05/18 1149 02/06/18 0348  NA 137 137  K 3.7 3.5  CL 96* 97*  CO2 25 26  GLUCOSE 113* 85  BUN 16 14  CREATININE 2.33* 1.89*  CALCIUM 9.3 8.3*    GFR: Estimated Creatinine Clearance: 29.6 mL/min (A) (by C-G formula based on SCr of 1.89 mg/dL (H)).  Liver Function Tests: Recent Labs  Lab 02/05/18 1149  AST 34  ALT 17  ALKPHOS 63  BILITOT 0.9  PROT 6.2*  ALBUMIN 3.6     Coagulation Profile: Recent Labs  Lab 02/05/18 1237  INR 1.39    CBG: Recent Labs  Lab 02/05/18 1128  GLUCAP 102*     Recent Results (from the past 240 hour(s))  Surgical PCR screen     Status: None   Collection Time: 02/06/18  1:41 AM  Result Value Ref Range Status  MRSA, PCR NEGATIVE NEGATIVE Final   Staphylococcus aureus NEGATIVE NEGATIVE Final    Comment: (NOTE) The Xpert SA Assay (FDA approved for NASAL specimens in patients 85 years of age and older), is one component of a comprehensive surveillance program. It is not intended to diagnose infection nor to guide or monitor treatment. Performed at [facility], [place] [address], [place], [place] [address]       Radiology Studies: No results found.   Medications:  Scheduled: . brimonidine-timolol  1 drop Both Eyes BID  . cholecalciferol  1,000 Units Oral Daily  . furosemide  20 mg Oral Daily  . imatinib  300 mg Oral Q lunch  . isosorbide mononitrate  30 mg Oral Daily  . lisinopril  10 mg Oral Daily  . multivitamin with minerals   1 tablet Oral Daily  . mupirocin ointment  1 application Nasal BID  . predniSONE  3 mg Oral Q breakfast  . pyridostigmine  60 mg Oral Daily  . rosuvastatin  5 mg Oral Daily   Continuous: . cefTRIAXone (ROCEPHIN)  IV Stopped (02/05/18 1529)  . heparin 900 Units/hr (02/06/18 0609)  . vancomycin     OQH:[id number] **OR** acetaminophen, HYDROcodone-acetaminophen, loperamide, morphine injection, ondansetron (ZOFRAN) IV, senna-docusate    Assessment/Plan:  Ischemic right foot in the setting of known history of peripheral artery disease Vascular surgery is following.  Plan is for surgery tomorrow.  Most likely he will need above-knee amputation to be determined based on further discussions between vascular surgery and patient.  Patient is being covered with antibiotics for now including vancomycin and ceftriaxone.  WBC has improved.  Pain control.  Anticoagulation on hold for now.  Plavix on hold for now.  Follow-up on cultures.  Coronary artery disease Medical management.  Stable.  Holding Plavix.  Acute on chronic kidney disease stage III It appears that patient has baseline creatinine was around 1.3-1.5 up until November.  Since November it has been a little bit higher.  Creatinine was 2.33 on presentation.  1.89 this morning.  Continue to monitor urine output.  Patient on gentle hydration.  On lower dose of furosemide.  Cut back on IV fluids.  Essential hypertension Blood pressure was soft in the emergency department.  Has improved.  Continue to monitor closely.  History of CML in remission Has been on [place] for 15 years.  Being continued in the hospital.  Diabetes mellitus type 2 On no medications at home.  Continue SSI.  History of pulmonary embolism and DVT Was on apixaban which is on hold.  Heparin for bridging.  Unknown as to the date of his DVT and PE.  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  He is on 3 mg of prednisone on a daily  basis.  His cortisol level is noted to be 6.0 this morning which is low.  Could consider stress dose steroids before surgery.  Hyperlipidemia Continue statin  Normocytic anemia Likely due to chronic kidney disease.  Hemoglobin stable for the most part.  No overt bleeding noted.  DVT Prophylaxis: Eliquis switched over to IV heparin    Code Status: Full code Family Communication: Discussed with the patient Disposition Plan: Management as outlined above.  Surgery tomorrow.    LOS: 1 day   [facility] Pager [phone] 02/06/2018, 10:41 AM  If 7PM-7AM, please contact night-coverage at [email or web address], password [facility]

## 2018-02-06 NOTE — Progress Notes (Addendum)
Progress Note    02/06/2018 8:13 AM Hospital Day 1  Subjective:  C/o right foot pain  afebrile  Vitals:   02/05/18 1950 02/06/18 0416  BP: (!) 147/52 (!) 144/54  Pulse: 80   Resp: 12 18  Temp: 98.5 F (36.9 C) 98.3 F (36.8 C)  SpO2: 99% 95%    Physical Exam: General:  No distress Lungs:  Non labored Extremities:      Right foot 02/06/18   CBC    Component Value Date/Time   WBC 7.9 02/06/2018 0348   RBC 3.20 (L) 02/06/2018 0348   HGB 10.0 (L) 02/06/2018 0348   HGB 10.7 (L) 01/04/2018 1216   HGB 12.4 (L) 11/21/2012 1008   HCT 30.0 (L) 02/06/2018 0348   HCT 32.8 (L) 01/04/2018 1216   HCT 35.8 (L) 11/21/2012 1008   PLT 265 02/06/2018 0348   PLT 348 01/04/2018 1216   MCV 93.8 02/06/2018 0348   MCV 95 01/04/2018 1216   MCV 90.7 11/21/2012 1008   MCH 31.3 02/06/2018 0348   MCHC 33.3 02/06/2018 0348   RDW 13.8 02/06/2018 0348   RDW 14.3 01/04/2018 1216   RDW 13.7 11/21/2012 1008   LYMPHSABS 1.5 02/05/2018 1149   LYMPHSABS 1.2 01/04/2018 1216   LYMPHSABS 1.1 11/21/2012 1008   MONOABS 1.4 (H) 02/05/2018 1149   MONOABS 0.6 11/21/2012 1008   EOSABS 0.3 02/05/2018 1149   EOSABS 0.2 01/04/2018 1216   BASOSABS 0.0 02/05/2018 1149   BASOSABS 0.1 01/04/2018 1216   BASOSABS 0.0 11/21/2012 1008    BMET    Component Value Date/Time   NA 137 02/06/2018 0348   NA 140 01/04/2018 1216   NA 141 02/26/2012 0852   K 3.5 02/06/2018 0348   K 3.4 (L) 02/26/2012 0852   CL 97 (L) 02/06/2018 0348   CL 105 02/26/2012 0852   CO2 26 02/06/2018 0348   CO2 28 02/26/2012 0852   GLUCOSE 85 02/06/2018 0348   GLUCOSE 77 02/26/2012 0852   BUN 14 02/06/2018 0348   BUN 11 01/04/2018 1216   BUN 13.0 02/26/2012 0852   CREATININE 1.89 (H) 02/06/2018 0348   CREATININE 1.33 05/11/2013 1246   CREATININE 1.3 02/26/2012 0852   CALCIUM 8.3 (L) 02/06/2018 0348   CALCIUM 9.1 02/26/2012 0852   GFRNONAA 33 (L) 02/06/2018 0348   GFRAA 38 (L) 02/06/2018 0348    INR    Component  Value Date/Time   INR 1.39 02/05/2018 1237     Intake/Output Summary (Last 24 hours) at 02/06/2018 0813 Last data filed at 02/06/2018 0435 Gross per 24 hour  Intake 1152 ml  Output 245 ml  Net 907 ml     Assessment/Plan:  77 y.o. male who  underwent arteriography through a left groin cutdown and endovascular treatment of right superficial femoral artery and popliteal occlusion.  He had no identifiable tibial runoff  Hospital Day 1  -pt with critical limb ischemia and will need right leg amputation.  Decision for above vs below knee amputation will be made at time of surgery.   -npo after MN; pre op orders placed.   Leontine Locket, PA-C [facility] [phone] 02/06/2018 8:13 AM  I have examined the patient, reviewed and agree with above.  Discussed with patient and wife present.  Plan for amputation tomorrow.  Will leave to Dr. Carlis Abbott and patient to decide on above-knee versus below-knee.  Again discussed significant risk of nonhealing of below-knee amputation given his vascular status.  Curt Jews, MD  02/06/2018 9:47 AM

## 2018-02-06 NOTE — Progress Notes (Signed)
[place] for heparin Indication: VTE treatment  Allergies  Allergen Reactions  . Other Other (See Comments)    Regarding scans, the PATIENT IS CLAUSTROPHOBIC!!!  . Phenergan [Promethazine Hcl] Swelling and Other (See Comments)    Cannot be combined with Zofran because swelling of the eyes resulted and patient became very restless-  . Zofran [Ondansetron Hcl] Swelling and Other (See Comments)    Cannot be combined with Phenergan because swelling of the eyes resulted and patient became very restless-  . Sulfamethoxazole Rash and Itching    Patient Measurements: Wt: 77.6 kg Ht: 5'7" Heparin Dosing Weight: 77 kg  Vital Signs: Temp: 98.3 F (36.8 C) (12/29 0416) Temp Source: Oral (12/29 0416) BP: 144/54 (12/29 0416) Pulse Rate: 80 (12/28 1950)  Labs: Recent Labs    02/05/18 1149 02/05/18 1237 02/06/18 0348  HGB 11.8*  --  10.0*  HCT 35.7*  --  30.0*  PLT 318  --  265  APTT  --   --  171*  LABPROT  --  16.9*  --   INR  --  1.39  --   HEPARINUNFRC  --   --  >2.20*  CREATININE 2.33*  --  1.89*    Estimated Creatinine Clearance: 29.6 mL/min (A) (by C-G formula based on SCr of 1.89 mg/dL (H)).    Assessment: 36 yom presenting with progressive pain/ischemia in R foot. He is on apixaban PTA (LD 12/28@0800 ). Plan currently for BKA vs AKA surgery on Monday- apixaban on hold and will bridge with heparin infusion.    Initial aPTT 171 sec, HL >2.2 units/ml.  Levels drawn appropriately  Goal of Therapy:  Heparin level 0.3-0.7 units/ml aPTT 66-102 seconds Monitor platelets by anticoagulation protocol: Yes   Plan:  Decrease heparin drip to 900 units/hr Check anti-Xa/aPTT level in 6-8 hours and daily while on heparin Continue to monitor H&H and platelets  Thanks for allowing pharmacy to be a part of this patient's care.  Excell Seltzer, PharmD Clinical Pharmacist

## 2018-02-06 NOTE — Plan of Care (Signed)
  Problem: Education: Goal: Knowledge of General Education information will improve Description Including pain rating scale, medication(s)/side effects and non-pharmacologic comfort measures Outcome: Progressing   Problem: Coping: Goal: Level of anxiety will decrease Outcome: Progressing   Problem: Elimination: Goal: Will not experience complications related to bowel motility Outcome: Progressing Goal: Will not experience complications related to urinary retention Outcome: Progressing   Problem: Pain Managment: Goal: General experience of comfort will improve Outcome: Progressing   Problem: Nutrition: Goal: Adequate nutrition will be maintained Outcome: Not Progressing   Problem: Skin Integrity: Goal: Risk for impaired skin integrity will decrease Outcome: Not Progressing

## 2018-02-06 NOTE — Progress Notes (Signed)
CRITICAL VALUE ALERT  Critical Value:  APTT 164  Date & Time Notied:  02/06/2018  2355hrs  Provider Notified: Kennon Holter   Orders Received/Actions taken: Awaiting.

## 2018-02-07 ENCOUNTER — Telehealth: Payer: Self-pay | Admitting: Neurology

## 2018-02-07 ENCOUNTER — Inpatient Hospital Stay (HOSPITAL_COMMUNITY): Payer: Medicare Other | Admitting: Anesthesiology

## 2018-02-07 ENCOUNTER — Encounter (HOSPITAL_COMMUNITY): Payer: Self-pay | Admitting: Anesthesiology

## 2018-02-07 ENCOUNTER — Encounter (HOSPITAL_COMMUNITY)
Admission: EM | Disposition: A | Discharge status: Rehabilitation Facility | Payer: Self-pay | Source: Home / Self Care | Attending: Family Medicine

## 2018-02-07 DIAGNOSIS — I2510 Atherosclerotic heart disease of native coronary artery without angina pectoris: Secondary | ICD-10-CM

## 2018-02-07 DIAGNOSIS — I2583 Coronary atherosclerosis due to lipid rich plaque: Secondary | ICD-10-CM

## 2018-02-07 HISTORY — PX: AMPUTATION: SHX166

## 2018-02-07 LAB — BASIC METABOLIC PANEL
Anion gap: 11 (ref 5–15)
BUN: 12 mg/dL (ref 8–23)
CO2: 27 mmol/L (ref 22–32)
Calcium: 8.5 mg/dL — ABNORMAL LOW (ref 8.9–10.3)
Chloride: 99 mmol/L (ref 98–111)
Creatinine, Ser: 1.75 mg/dL — ABNORMAL HIGH (ref 0.61–1.24)
GFR calc Af Amer: 42 mL/min — ABNORMAL LOW (ref >60)
GFR calc non Af Amer: 36 mL/min — ABNORMAL LOW (ref >60)
GLUCOSE: 97 mg/dL (ref 70–99)
Potassium: 3.6 mmol/L (ref 3.5–5.1)
Sodium: 137 mmol/L (ref 135–145)

## 2018-02-07 LAB — GLUCOSE, CAPILLARY
GLUCOSE-CAPILLARY: 200 mg/dL — AB (ref 70–99)
Glucose-Capillary: 111 mg/dL — ABNORMAL HIGH (ref 70–99)
Glucose-Capillary: 115 mg/dL — ABNORMAL HIGH (ref 70–99)
Glucose-Capillary: 161 mg/dL — ABNORMAL HIGH (ref 70–99)
Glucose-Capillary: 185 mg/dL — ABNORMAL HIGH (ref 70–99)

## 2018-02-07 LAB — HEPARIN LEVEL (UNFRACTIONATED): Heparin Unfractionated: 2 IU/mL — ABNORMAL HIGH (ref 0.30–0.70)

## 2018-02-07 LAB — CBC
HCT: 29 % — ABNORMAL LOW (ref 39.0–52.0)
Hemoglobin: 9.6 g/dL — ABNORMAL LOW (ref 13.0–17.0)
MCH: 31.4 pg (ref 26.0–34.0)
MCHC: 33.1 g/dL (ref 30.0–36.0)
MCV: 94.8 fL (ref 80.0–100.0)
PLATELETS: 276 10*3/uL (ref 150–400)
RBC: 3.06 MIL/uL — AB (ref 4.22–5.81)
RDW: 14 % (ref 11.5–15.5)
WBC: 8 10*3/uL (ref 4.0–10.5)
nRBC: 0 % (ref 0.0–0.2)

## 2018-02-07 LAB — APTT: aPTT: 63 seconds — ABNORMAL HIGH (ref 24–36)

## 2018-02-07 LAB — PROTIME-INR
INR: 1.14
PROTHROMBIN TIME: 14.5 s (ref 11.4–15.2)

## 2018-02-07 SURGERY — AMPUTATION, ABOVE KNEE
Anesthesia: General | Site: Leg Upper | Laterality: Right

## 2018-02-07 MED ORDER — PHENOL 1.4 % MT LIQD: 1.0000 | OROMUCOSAL | Status: DC | PRN

## 2018-02-07 MED ORDER — LACTATED RINGERS IV SOLN
INTRAVENOUS | Status: DC
  Administered 2018-02-07 (×2): via INTRAVENOUS

## 2018-02-07 MED ORDER — PHENYLEPHRINE 40 MCG/ML (10ML) SYRINGE FOR IV PUSH (FOR BLOOD PRESSURE SUPPORT)
PREFILLED_SYRINGE | INTRAVENOUS | Status: AC
  Filled 2018-02-07: qty 10

## 2018-02-07 MED ORDER — ACETAMINOPHEN 325 MG PO TABS
325.0000 mg | ORAL_TABLET | ORAL | Status: DC | PRN
  Administered 2018-02-08: 325 mg via ORAL
  Filled 2018-02-07: qty 1

## 2018-02-07 MED ORDER — MAGNESIUM SULFATE 2 GM/50ML IV SOLN: 2.0000 g | Freq: Once | INTRAVENOUS | Status: DC | PRN

## 2018-02-07 MED ORDER — FENTANYL CITRATE (PF) 250 MCG/5ML IJ SOLN
INTRAMUSCULAR | Status: AC
  Filled 2018-02-07: qty 5

## 2018-02-07 MED ORDER — ONDANSETRON HCL 4 MG/2ML IJ SOLN
INTRAMUSCULAR | Status: AC
  Filled 2018-02-07: qty 2

## 2018-02-07 MED ORDER — DEXAMETHASONE SODIUM PHOSPHATE 4 MG/ML IJ SOLN
INTRAMUSCULAR | Status: DC | PRN
  Administered 2018-02-07: 4 mg via INTRAVENOUS

## 2018-02-07 MED ORDER — PROPOFOL 10 MG/ML IV BOLUS
INTRAVENOUS | Status: DC | PRN
  Administered 2018-02-07: 150 mg via INTRAVENOUS

## 2018-02-07 MED ORDER — LIDOCAINE 2% (20 MG/ML) 5 ML SYRINGE
INTRAMUSCULAR | Status: AC
  Filled 2018-02-07: qty 5

## 2018-02-07 MED ORDER — OXYCODONE HCL 5 MG/5ML PO SOLN: 5.0000 mg | Freq: Once | ORAL | Status: DC | PRN

## 2018-02-07 MED ORDER — HYDROMORPHONE HCL 1 MG/ML IJ SOLN
0.5000 mg | INTRAMUSCULAR | Status: DC | PRN
  Administered 2018-02-07: 1 mg via INTRAVENOUS
  Filled 2018-02-07: qty 1

## 2018-02-07 MED ORDER — PANTOPRAZOLE SODIUM 40 MG PO TBEC
40.0000 mg | DELAYED_RELEASE_TABLET | Freq: Every day | ORAL | Status: DC
  Administered 2018-02-08 – 2018-02-10 (×3): 40 mg via ORAL
  Filled 2018-02-07 (×3): qty 1

## 2018-02-07 MED ORDER — BACITRACIN ZINC 500 UNIT/GM EX OINT
TOPICAL_OINTMENT | CUTANEOUS | Status: AC
  Filled 2018-02-07: qty 28.35

## 2018-02-07 MED ORDER — CEFAZOLIN SODIUM-DEXTROSE 2-4 GM/100ML-% IV SOLN
2.0000 g | Freq: Once | INTRAVENOUS | Status: AC
  Administered 2018-02-07: 2 g via INTRAVENOUS
  Filled 2018-02-07: qty 100

## 2018-02-07 MED ORDER — ONDANSETRON HCL 4 MG/2ML IJ SOLN
INTRAMUSCULAR | Status: DC | PRN
  Administered 2018-02-07: 4 mg via INTRAVENOUS

## 2018-02-07 MED ORDER — SODIUM CHLORIDE 0.9 % IV SOLN
INTRAVENOUS | Status: DC | PRN
  Administered 2018-02-07: 25 ug/min via INTRAVENOUS

## 2018-02-07 MED ORDER — GUAIFENESIN-DM 100-10 MG/5ML PO SYRP: 15.0000 mL | ORAL_SOLUTION | ORAL | Status: DC | PRN

## 2018-02-07 MED ORDER — DOCUSATE SODIUM 100 MG PO CAPS: 100.0000 mg | ORAL_CAPSULE | Freq: Every day | ORAL | Status: DC

## 2018-02-07 MED ORDER — VANCOMYCIN HCL IN DEXTROSE 750-5 MG/150ML-% IV SOLN
750.0000 mg | INTRAVENOUS | Status: DC
  Administered 2018-02-07 – 2018-02-08 (×2): 750 mg via INTRAVENOUS
  Filled 2018-02-07 (×4): qty 150

## 2018-02-07 MED ORDER — OXYCODONE HCL 5 MG PO TABS: 5.0000 mg | ORAL_TABLET | Freq: Once | ORAL | Status: DC | PRN

## 2018-02-07 MED ORDER — ONDANSETRON HCL 4 MG/2ML IJ SOLN: 4.0000 mg | Freq: Four times a day (QID) | INTRAMUSCULAR | Status: DC | PRN

## 2018-02-07 MED ORDER — PHENYLEPHRINE 40 MCG/ML (10ML) SYRINGE FOR IV PUSH (FOR BLOOD PRESSURE SUPPORT)
PREFILLED_SYRINGE | INTRAVENOUS | Status: DC | PRN
  Administered 2018-02-07 (×3): 80 ug via INTRAVENOUS

## 2018-02-07 MED ORDER — OXYCODONE-ACETAMINOPHEN 5-325 MG PO TABS
1.0000 | ORAL_TABLET | ORAL | Status: DC | PRN
  Administered 2018-02-07 – 2018-02-08 (×2): 2 via ORAL
  Filled 2018-02-07 (×2): qty 2

## 2018-02-07 MED ORDER — FENTANYL CITRATE (PF) 100 MCG/2ML IJ SOLN
INTRAMUSCULAR | Status: DC | PRN
  Administered 2018-02-07 (×3): 50 ug via INTRAVENOUS

## 2018-02-07 MED ORDER — HYDRALAZINE HCL 20 MG/ML IJ SOLN: 5.0000 mg | INTRAMUSCULAR | Status: DC | PRN

## 2018-02-07 MED ORDER — PROPOFOL 10 MG/ML IV BOLUS
INTRAVENOUS | Status: AC
  Filled 2018-02-07: qty 20

## 2018-02-07 MED ORDER — HYDROMORPHONE HCL 1 MG/ML IJ SOLN
INTRAMUSCULAR | Status: AC
  Administered 2018-02-07: 0.5 mg via INTRAVENOUS
  Filled 2018-02-07: qty 1

## 2018-02-07 MED ORDER — BACITRACIN ZINC 500 UNIT/GM EX OINT
TOPICAL_OINTMENT | CUTANEOUS | Status: DC | PRN
  Administered 2018-02-07: 1 via TOPICAL

## 2018-02-07 MED ORDER — ALUM & MAG HYDROXIDE-SIMETH 200-200-20 MG/5ML PO SUSP: 15.0000 mL | ORAL | Status: DC | PRN

## 2018-02-07 MED ORDER — POTASSIUM CHLORIDE CRYS ER 20 MEQ PO TBCR: 20.0000 meq | EXTENDED_RELEASE_TABLET | Freq: Once | ORAL | Status: DC | PRN

## 2018-02-07 MED ORDER — HYDROMORPHONE HCL 1 MG/ML IJ SOLN
0.2500 mg | INTRAMUSCULAR | Status: DC | PRN
  Administered 2018-02-07 (×2): 0.5 mg via INTRAVENOUS

## 2018-02-07 MED ORDER — KCL IN DEXTROSE-NACL 20-5-0.45 MEQ/L-%-% IV SOLN
INTRAVENOUS | Status: DC
  Administered 2018-02-07: 17:00:00 via INTRAVENOUS
  Filled 2018-02-07: qty 1000

## 2018-02-07 MED ORDER — LIDOCAINE HCL (CARDIAC) PF 100 MG/5ML IV SOSY
PREFILLED_SYRINGE | INTRAVENOUS | Status: DC | PRN
  Administered 2018-02-07: 80 mg via INTRAVENOUS

## 2018-02-07 MED ORDER — LABETALOL HCL 5 MG/ML IV SOLN: 10.0000 mg | INTRAVENOUS | Status: DC | PRN

## 2018-02-07 MED ORDER — HYDROMORPHONE HCL 1 MG/ML IJ SOLN
0.5000 mg | INTRAMUSCULAR | Status: DC | PRN
  Administered 2018-02-07 – 2018-02-08 (×5): 1 mg via INTRAVENOUS
  Filled 2018-02-07 (×5): qty 1

## 2018-02-07 MED ORDER — HEPARIN (PORCINE) 25000 UT/250ML-% IV SOLN
650.0000 [IU]/h | INTRAVENOUS | Status: DC
  Administered 2018-02-08: 550 [IU]/h via INTRAVENOUS
  Filled 2018-02-07: qty 250

## 2018-02-07 MED ORDER — HEPARIN (PORCINE) 25000 UT/250ML-% IV SOLN: 500.0000 [IU]/h | INTRAVENOUS | Status: DC

## 2018-02-07 MED ORDER — 0.9 % SODIUM CHLORIDE (POUR BTL) OPTIME
TOPICAL | Status: DC | PRN
  Administered 2018-02-07: 1000 mL

## 2018-02-07 MED ORDER — ACETAMINOPHEN 325 MG RE SUPP: 325.0000 mg | RECTAL | Status: DC | PRN

## 2018-02-07 MED ORDER — METOPROLOL TARTRATE 5 MG/5ML IV SOLN: 2.0000 mg | INTRAVENOUS | Status: DC | PRN

## 2018-02-07 SURGICAL SUPPLY — 56 items
BANDAGE ACE 4X5 VEL STRL LF (GAUZE/BANDAGES/DRESSINGS) ×3 IMPLANT
BANDAGE ACE 6X5 VEL STRL LF (GAUZE/BANDAGES/DRESSINGS) ×3 IMPLANT
BANDAGE ESMARK 6X9 LF (GAUZE/BANDAGES/DRESSINGS) ×1 IMPLANT
BLADE SAW RECIP 87.9 MT (BLADE) ×3 IMPLANT
BNDG CMPR 9X6 STRL LF SNTH (GAUZE/BANDAGES/DRESSINGS) ×1
BNDG CMPR MED 15X6 ELC VLCR LF (GAUZE/BANDAGES/DRESSINGS) ×1
BNDG COHESIVE 6X5 TAN STRL LF (GAUZE/BANDAGES/DRESSINGS) ×3 IMPLANT
BNDG ELASTIC 6X15 VLCR STRL LF (GAUZE/BANDAGES/DRESSINGS) ×2 IMPLANT
BNDG ESMARK 6X9 LF (GAUZE/BANDAGES/DRESSINGS) ×3
BNDG GAUZE ELAST 4 BULKY (GAUZE/BANDAGES/DRESSINGS) ×3 IMPLANT
CANISTER SUCT 3000ML PPV (MISCELLANEOUS) ×3 IMPLANT
COVER SURGICAL LIGHT HANDLE (MISCELLANEOUS) ×3 IMPLANT
COVER WAND RF STERILE (DRAPES) ×3 IMPLANT
CUFF TOURNIQUET SINGLE 18IN (TOURNIQUET CUFF) IMPLANT
CUFF TOURNIQUET SINGLE 24IN (TOURNIQUET CUFF) ×2 IMPLANT
CUFF TOURNIQUET SINGLE 34IN LL (TOURNIQUET CUFF) IMPLANT
CUFF TOURNIQUET SINGLE 44IN (TOURNIQUET CUFF) IMPLANT
DRAIN CHANNEL 19F RND (DRAIN) IMPLANT
DRAPE HALF SHEET 40X57 (DRAPES) ×3 IMPLANT
DRAPE ORTHO SPLIT 77X108 STRL (DRAPES) ×4
DRAPE SURG ORHT 6 SPLT 77X108 (DRAPES) ×2 IMPLANT
DRAPE U-SHAPE 47X51 STRL (DRAPES) ×3 IMPLANT
DRSG ADAPTIC 3X8 NADH LF (GAUZE/BANDAGES/DRESSINGS) ×3 IMPLANT
ELECT CAUTERY BLADE 6.4 (BLADE) ×3 IMPLANT
ELECT REM PT RETURN 9FT ADLT (ELECTROSURGICAL) ×3
ELECTRODE REM PT RTRN 9FT ADLT (ELECTROSURGICAL) ×1 IMPLANT
EVACUATOR SILICONE 100CC (DRAIN) IMPLANT
GAUZE SPONGE 4X4 12PLY STRL (GAUZE/BANDAGES/DRESSINGS) ×3 IMPLANT
GAUZE SPONGE 4X4 12PLY STRL LF (GAUZE/BANDAGES/DRESSINGS) ×2 IMPLANT
GLOVE BIO SURGEON STRL SZ7.5 (GLOVE) ×3 IMPLANT
GLOVE BIOGEL PI IND STRL 8 (GLOVE) ×1 IMPLANT
GLOVE BIOGEL PI INDICATOR 8 (GLOVE) ×2
GOWN STRL REUS W/ TWL LRG LVL3 (GOWN DISPOSABLE) ×3 IMPLANT
GOWN STRL REUS W/TWL LRG LVL3 (GOWN DISPOSABLE) ×9
KIT BASIN OR (CUSTOM PROCEDURE TRAY) ×3 IMPLANT
KIT TURNOVER KIT B (KITS) ×3 IMPLANT
NS IRRIG 1000ML POUR BTL (IV SOLUTION) ×3 IMPLANT
PACK GENERAL/GYN (CUSTOM PROCEDURE TRAY) ×3 IMPLANT
PAD ABD 8X10 STRL (GAUZE/BANDAGES/DRESSINGS) ×2 IMPLANT
PAD ARMBOARD 7.5X6 YLW CONV (MISCELLANEOUS) ×6 IMPLANT
SPONGE LAP 18X18 X RAY DECT (DISPOSABLE) ×4 IMPLANT
STAPLER VISISTAT (STAPLE) ×3 IMPLANT
STOCKINETTE IMPERVIOUS LG (DRAPES) ×3 IMPLANT
SUT ETHILON 3 0 PS 1 (SUTURE) IMPLANT
SUT PROLENE 5 0 C 1 24 (SUTURE) ×2 IMPLANT
SUT SILK 0 TIES 10X30 (SUTURE) ×3 IMPLANT
SUT SILK 2 0 (SUTURE) ×3
SUT SILK 2 0 SH CR/8 (SUTURE) ×3 IMPLANT
SUT SILK 2-0 18XBRD TIE 12 (SUTURE) ×1 IMPLANT
SUT SILK 3 0 (SUTURE) ×2
SUT SILK 3-0 18XBRD TIE 12 (SUTURE) ×1 IMPLANT
SUT VIC AB 2-0 CT1 18 (SUTURE) ×3 IMPLANT
TOWEL GREEN STERILE (TOWEL DISPOSABLE) ×6 IMPLANT
TOWEL GREEN STERILE FF (TOWEL DISPOSABLE) ×3 IMPLANT
UNDERPAD 30X30 (UNDERPADS AND DIAPERS) ×3 IMPLANT
WATER STERILE IRR 1000ML POUR (IV SOLUTION) ×3 IMPLANT

## 2018-02-07 NOTE — Anesthesia Preprocedure Evaluation (Addendum)
Anesthesia Evaluation  Patient identified by MRN, date of birth, ID band Patient awake    Reviewed: Allergy & Precautions, NPO status , Patient's Chart, lab work & pertinent test results  History of Anesthesia Complications (+) PONV  Airway Mallampati: I  TM Distance: >3 FB Neck ROM: Full    Dental no notable dental hx. (+) Poor Dentition, Dental Advisory Given   Pulmonary    Pulmonary exam normal breath sounds clear to auscultation       Cardiovascular hypertension, Pt. on medications + CAD  Normal cardiovascular exam Rhythm:Regular Rate:Normal     Neuro/Psych Anxiety  Neuromuscular disease CVA    GI/Hepatic GERD  Medicated and Controlled,  Endo/Other  diabetes  Renal/GU      Musculoskeletal   Abdominal   Peds  Hematology   Anesthesia Other Findings   Reproductive/Obstetrics                            Anesthesia Physical  Anesthesia Plan  ASA: III  Anesthesia Plan: General   Post-op Pain Management:    Induction:   PONV Risk Score and Plan: 3 and Ondansetron, Midazolam, Treatment may vary due to age or medical condition and Dexamethasone  Airway Management Planned: LMA  Additional Equipment:   Intra-op Plan:   Post-operative Plan: Extubation in OR  Informed Consent: I have reviewed the patients History and Physical, chart, labs and discussed the procedure including the risks, benefits and alternatives for the proposed anesthesia with the patient or authorized representative who has indicated his/her understanding and acceptance.     Plan Discussed with: CRNA and Surgeon  Anesthesia Plan Comments:         Anesthesia Quick Evaluation

## 2018-02-07 NOTE — Anesthesia Procedure Notes (Signed)
Procedure Name: LMA Insertion Date/Time: 02/07/2018 1:24 PM Performed by: Jenne Campus, CRNA Pre-anesthesia Checklist: Patient identified, Emergency Drugs available, Suction available and Patient being monitored Patient Re-evaluated:Patient Re-evaluated prior to induction Oxygen Delivery Method: Circle System Utilized Preoxygenation: Pre-oxygenation with 100% oxygen Induction Type: IV induction Ventilation: Mask ventilation without difficulty LMA: LMA inserted LMA Size: 4.0 Number of attempts: 1 Placement Confirmation: positive ETCO2 and breath sounds checked- equal and bilateral Tube secured with: Tape Dental Injury: Teeth and Oropharynx as per pre-operative assessment

## 2018-02-07 NOTE — Telephone Encounter (Signed)
FYI pt wife on DPR(Alfred,[name]) has called wanting Dr Jannifer Franklin to be aware that today pt is having an amputation of his right foot and part of leg today.  Wife has not asked for a call back, just wants Dr Jannifer Franklin to know

## 2018-02-07 NOTE — Transfer of Care (Signed)
Immediate Anesthesia Transfer of Care Note  Patient: [name]  Procedure(s) Performed: right leg AMPUTATION ABOVE KNEE (Right Leg Upper)  Patient Location: PACU  Anesthesia Type:General  Level of Consciousness: awake, alert  and oriented  Airway & Oxygen Therapy: Patient Spontanous Breathing and Patient connected to nasal cannula oxygen  Post-op Assessment: Report given to RN and Post -op Vital signs reviewed and stable  Post vital signs: Reviewed and stable  Last Vitals:  Vitals Value Taken Time  BP 128/63 02/07/2018  3:01 PM  Temp    Pulse 71 02/07/2018  3:03 PM  Resp 15 02/07/2018  3:03 PM  SpO2 100 % 02/07/2018  3:03 PM  Vitals shown include unvalidated device data.  Last Pain:  Vitals:   02/07/18 0937  TempSrc:   PainSc: 6       Patients Stated Pain Goal: 0 (05/69/79 4801)  Complications: No apparent anesthesia complications

## 2018-02-07 NOTE — Interval H&P Note (Signed)
History and Physical Interval Note:  02/07/2018 12:58 PM  [name]  has presented today for surgery, with the diagnosis of ESRD  The various methods of treatment have been discussed with the patient and family. After consideration of risks, benefits and other options for treatment, the patient has consented to  Procedure(s): [place] (Right) as a surgical intervention .  The patient's history has been reviewed, patient examined, no change in status, stable for surgery.  I have reviewed the patient's chart and labs.  Questions were answered to the patient's satisfaction.     [name]

## 2018-02-07 NOTE — Op Note (Signed)
    NAME: [name]    MRN: [id number] DOB: 12/24/1940    DATE OF OPERATION: 02/07/2018  PREOP DIAGNOSIS:    Ischemic right lower extremity  POSTOP DIAGNOSIS:    Same  PROCEDURE:    Right above-the-knee amputation  SURGEON: Judeth Cornfield. Scot Dock, MD, FACS  ASSIST: Gaye Alken, RNFA  ANESTHESIA: General  EBL: 200 cc  INDICATIONS:    [name] is a 77 y.o. male who presented with an ischemic right lower extremity and no further options for revascularization.  He presents for right above-the-knee amputation.  FINDINGS:   The tissue appeared well perfused.  There were no signs of infection at this level.  TECHNIQUE:   The patient was taken to the operating room and received a general anesthetic.  The right leg was prepped and draped in usual sterile fashion.  Tourniquet was placed on the upper thigh.  A fishmouth incision was marked above the patella.  The leg was exsanguinated and the tourniquet inflated to 300 mmHg.  Under tourniquet control the incision was carried down through the skin and subcutaneous tissue fascia and muscle.  Of note the tourniquet did not provide good hemostasis and this was likely because of the stent in the superficial femoral artery.  I therefore did the remainder of the dissection with electrocautery.  Next the superficial femoral artery and vein were individually identified clamped and divided.  I retracted the stent from the superficial femoral artery to allow me to clamp this.  This was oversewn with a 2-0 silk suture.  Tourniquet was then released.  Additional hemostasis was obtained using electrocautery and 2-0 silk ties.  The edges of the bone were rasped.  The bone had been divided.  The dissection was completed and the leg removed and sent to pathology.  Hemostasis was obtained in the wound the wound was then closed with interrupted 2-0 Vicryl's and the skin closed with staples.  A sterile dressing was applied.  The patient tolerated the  procedure well was transferred to the recovery room in stable condition.  All needle and sponge counts were correct.  [name], MD, FACS [facility]  DATE OF DICTATION:   02/07/2018

## 2018-02-07 NOTE — H&P (View-Only) (Signed)
  Progress Note    02/07/2018 7:53 AM  Subjective:  C/o right foot pain   Vitals:   02/06/18 1937 02/07/18 0456  BP: (!) 112/59 (!) 124/50  Pulse: 80 85  Resp: 17 18  Temp: 98.4 F (36.9 C) 98.5 F (36.9 C)  SpO2: 100% 94%    Physical Exam: General:  No distress Lungs:  Non labored Extremities:      Right foot 02/06/18   CBC    Component Value Date/Time   WBC 8.0 02/07/2018 0224   RBC 3.06 (L) 02/07/2018 0224   HGB 9.6 (L) 02/07/2018 0224   HGB 10.7 (L) 01/04/2018 1216   HGB 12.4 (L) 11/21/2012 1008   HCT 29.0 (L) 02/07/2018 0224   HCT 32.8 (L) 01/04/2018 1216   HCT 35.8 (L) 11/21/2012 1008   PLT 276 02/07/2018 0224   PLT 348 01/04/2018 1216   MCV 94.8 02/07/2018 0224   MCV 95 01/04/2018 1216   MCV 90.7 11/21/2012 1008   MCH 31.4 02/07/2018 0224   MCHC 33.1 02/07/2018 0224   RDW 14.0 02/07/2018 0224   RDW 14.3 01/04/2018 1216   RDW 13.7 11/21/2012 1008   LYMPHSABS 1.5 02/05/2018 1149   LYMPHSABS 1.2 01/04/2018 1216   LYMPHSABS 1.1 11/21/2012 1008   MONOABS 1.4 (H) 02/05/2018 1149   MONOABS 0.6 11/21/2012 1008   EOSABS 0.3 02/05/2018 1149   EOSABS 0.2 01/04/2018 1216   BASOSABS 0.0 02/05/2018 1149   BASOSABS 0.1 01/04/2018 1216   BASOSABS 0.0 11/21/2012 1008    BMET    Component Value Date/Time   NA 137 02/07/2018 0224   NA 140 01/04/2018 1216   NA 141 02/26/2012 0852   K 3.6 02/07/2018 0224   K 3.4 (L) 02/26/2012 0852   CL 99 02/07/2018 0224   CL 105 02/26/2012 0852   CO2 27 02/07/2018 0224   CO2 28 02/26/2012 0852   GLUCOSE 97 02/07/2018 0224   GLUCOSE 77 02/26/2012 0852   BUN 12 02/07/2018 0224   BUN 11 01/04/2018 1216   BUN 13.0 02/26/2012 0852   CREATININE 1.75 (H) 02/07/2018 0224   CREATININE 1.33 05/11/2013 1246   CREATININE 1.3 02/26/2012 0852   CALCIUM 8.5 (L) 02/07/2018 0224   CALCIUM 9.1 02/26/2012 0852   GFRNONAA 36 (L) 02/07/2018 0224   GFRAA 42 (L) 02/07/2018 0224    INR    Component Value Date/Time   INR 1.39  02/05/2018 1237     Intake/Output Summary (Last 24 hours) at 02/07/2018 0753 Last data filed at 02/06/2018 2024 Gross per 24 hour  Intake 460 ml  Output 125 ml  Net 335 ml     Assessment/Plan:  77 y.o. male who  underwent arteriography through a left groin cutdown and endovascular treatment of right superficial femoral artery and popliteal occlusion.  He had no identifiable tibial runoff  Long discussion with patient and wife at bedside.  High risk for healing BKA.  Patient in agreement to proceed with R AKA today.  Foot is not viable at this time and looks much worse than clinic visit 3 weeks ago.  Please keep NPO.    [name], MD [facility] [place] Office: [phone] Pager: [place]

## 2018-02-07 NOTE — Progress Notes (Signed)
[place] for heparin Indication: VTE treatment  Allergies  Allergen Reactions  . Other Other (See Comments)    Regarding scans, the PATIENT IS CLAUSTROPHOBIC!!!  . Phenergan [Promethazine Hcl] Swelling and Other (See Comments)    Cannot be combined with Zofran because swelling of the eyes resulted and patient became very restless-  . Zofran [Ondansetron Hcl] Swelling and Other (See Comments)    Cannot be combined with Phenergan because swelling of the eyes resulted and patient became very restless-  . Sulfamethoxazole Rash and Itching    Patient Measurements: Wt: 77.6 kg Ht: 5'7" Heparin Dosing Weight: 77 kg  Vital Signs: Temp: 98.5 F (36.9 C) (12/30 1628) Temp Source: Axillary (12/30 1628) BP: 139/47 (12/30 1628) Pulse Rate: 78 (12/30 1628)  Labs: Recent Labs    02/05/18 1149 02/05/18 1237  02/06/18 0348 02/06/18 1249 02/06/18 2229 02/07/18 0224 02/07/18 0725  HGB 11.8*  --   --  10.0*  --   --  9.6*  --   HCT 35.7*  --   --  30.0*  --   --  29.0*  --   PLT 318  --   --  265  --   --  276  --   APTT  --   --    < > 171* 128* 164*  --  63*  LABPROT  --  16.9*  --   --   --   --   --  14.5  INR  --  1.39  --   --   --   --   --  1.14  HEPARINUNFRC  --   --   --  >2.20* >2.20*  --   --  2.00*  CREATININE 2.33*  --   --  1.89*  --   --  1.75*  --    < > = values in this interval not displayed.    Estimated Creatinine Clearance: 31.9 mL/min (A) (by C-G formula based on SCr of 1.75 mg/dL (H)).  Assessment: 42 yom presenting with progressive pain/ischemia in R foot. He is on apixaban PTA (LD 12/28@0800 ). 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 Pharmacy consulted to resume heparin drip 8h post-op. No bleeding noted. AET 15:04. Last aPTT was just below goal at 63 sec on 500 units/hr.   Goal of Therapy:  Heparin level 0.3-0.7 units/ml aPTT 66-102 seconds Monitor platelets by anticoagulation protocol: Yes   Plan:  Begin heparin drip at 550  units/hr with no bolus at 23:15 tonight 8h aPTT and heparin level Daily aPTT, heparin level and CBC Monitor for s/sx of bleeding   Thanks for allowing pharmacy to be a part of this patient's care.  Renold Genta, PharmD, BCPS Clinical Pharmacist Clinical phone for 02/07/2018 until 10p is [phone] 02/07/2018 5:27 PM  **Pharmacist phone directory can now be found on [email or web address] listed under [place]**

## 2018-02-07 NOTE — Progress Notes (Signed)
  Progress Note    02/07/2018 7:53 AM  Subjective:  C/o right foot pain   Vitals:   02/06/18 1937 02/07/18 0456  BP: (!) 112/59 (!) 124/50  Pulse: 80 85  Resp: 17 18  Temp: 98.4 F (36.9 C) 98.5 F (36.9 C)  SpO2: 100% 94%    Physical Exam: General:  No distress Lungs:  Non labored Extremities:      Right foot 02/06/18   CBC    Component Value Date/Time   WBC 8.0 02/07/2018 0224   RBC 3.06 (L) 02/07/2018 0224   HGB 9.6 (L) 02/07/2018 0224   HGB 10.7 (L) 01/04/2018 1216   HGB 12.4 (L) 11/21/2012 1008   HCT 29.0 (L) 02/07/2018 0224   HCT 32.8 (L) 01/04/2018 1216   HCT 35.8 (L) 11/21/2012 1008   PLT 276 02/07/2018 0224   PLT 348 01/04/2018 1216   MCV 94.8 02/07/2018 0224   MCV 95 01/04/2018 1216   MCV 90.7 11/21/2012 1008   MCH 31.4 02/07/2018 0224   MCHC 33.1 02/07/2018 0224   RDW 14.0 02/07/2018 0224   RDW 14.3 01/04/2018 1216   RDW 13.7 11/21/2012 1008   LYMPHSABS 1.5 02/05/2018 1149   LYMPHSABS 1.2 01/04/2018 1216   LYMPHSABS 1.1 11/21/2012 1008   MONOABS 1.4 (H) 02/05/2018 1149   MONOABS 0.6 11/21/2012 1008   EOSABS 0.3 02/05/2018 1149   EOSABS 0.2 01/04/2018 1216   BASOSABS 0.0 02/05/2018 1149   BASOSABS 0.1 01/04/2018 1216   BASOSABS 0.0 11/21/2012 1008    BMET    Component Value Date/Time   NA 137 02/07/2018 0224   NA 140 01/04/2018 1216   NA 141 02/26/2012 0852   K 3.6 02/07/2018 0224   K 3.4 (L) 02/26/2012 0852   CL 99 02/07/2018 0224   CL 105 02/26/2012 0852   CO2 27 02/07/2018 0224   CO2 28 02/26/2012 0852   GLUCOSE 97 02/07/2018 0224   GLUCOSE 77 02/26/2012 0852   BUN 12 02/07/2018 0224   BUN 11 01/04/2018 1216   BUN 13.0 02/26/2012 0852   CREATININE 1.75 (H) 02/07/2018 0224   CREATININE 1.33 05/11/2013 1246   CREATININE 1.3 02/26/2012 0852   CALCIUM 8.5 (L) 02/07/2018 0224   CALCIUM 9.1 02/26/2012 0852   GFRNONAA 36 (L) 02/07/2018 0224   GFRAA 42 (L) 02/07/2018 0224    INR    Component Value Date/Time   INR 1.39  02/05/2018 1237     Intake/Output Summary (Last 24 hours) at 02/07/2018 0753 Last data filed at 02/06/2018 2024 Gross per 24 hour  Intake 460 ml  Output 125 ml  Net 335 ml     Assessment/Plan:  77 y.o. male who  underwent arteriography through a left groin cutdown and endovascular treatment of right superficial femoral artery and popliteal occlusion.  He had no identifiable tibial runoff  Long discussion with patient and wife at bedside.  High risk for healing BKA.  Patient in agreement to proceed with R AKA today.  Foot is not viable at this time and looks much worse than clinic visit 3 weeks ago.  Please keep NPO.    [name], MD [facility] [place] Office: [phone] Pager: [place]

## 2018-02-07 NOTE — Progress Notes (Signed)
ANTICOAGULATION CONSULT NOTE - Follow Up Consult  Pharmacy Consult for heparin Indication: VTE  Labs: Recent Labs    02/05/18 1149 02/05/18 1237 02/06/18 0348 02/06/18 1249 02/06/18 2229  HGB 11.8*  --  10.0*  --   --   HCT 35.7*  --  30.0*  --   --   PLT 318  --  265  --   --   APTT  --   --  171* 128* 164*  LABPROT  --  16.9*  --   --   --   INR  --  1.39  --   --   --   HEPARINUNFRC  --   --  >2.20* >2.20*  --   CREATININE 2.33*  --  1.89*  --   --     Assessment: 77yo male remains supratherapeutic on heparin with higher PTT despite decreased rate; lab drawn from left hand and heparin running in right arm; no gtt issues or signs of bleeding per RN.  Goal of Therapy:  aPTT 66-102 seconds   Plan:  Will decrease heparin gtt by 3 units/kg/hr to 500 units/hr and check PTT in 8 hours.    Wynona Neat, PharmD, BCPS  02/07/2018,12:07 AM

## 2018-02-07 NOTE — Progress Notes (Signed)
[facility] PROGRESS NOTE  [name] WHQ:[id number] DOB: [birth year] DOA: 02/05/2018  PCP: Libby Maw, MD  Brief History/Interval Summary: 77 y.o. 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  [name] has known PVD of the lower extremities who underwent stenting X 3 of the right extremity on 12/23/17.  Since that time he has had persistent pain and worsening of symptoms in the right foot.    Patient was found findings suggestive of ischemia.  Patient seen by vascular surgery.  Plan is for amputation on Monday.    Reason for Visit: Ischemic right lower extremity  Consultants: Vascular surgery  Procedures: None yet  Antibiotics: Vancomycin and ceftriaxone  Subjective/Interval History: Patient states that morphine is not helping the pain in his right leg and foot.  Otherwise denies any shortness of breath.  No nausea vomiting.  His wife is at the bedside.    ROS: Denies any abdominal pain  Objective:  Vital Signs  Vitals:   02/06/18 1322 02/06/18 1937 02/07/18 0456 02/07/18 0900  BP: (!) 127/59 (!) 112/59 (!) 124/50 (!) 145/62  Pulse:  80 85 90  Resp:  17 18 18   Temp: 98.4 F (36.9 C) 98.4 F (36.9 C) 98.5 F (36.9 C) 98 F (36.7 C)  TempSrc: Oral Oral Oral Oral  SpO2: 95% 100% 94% 97%  Weight:      Height:        Intake/Output Summary (Last 24 hours) at 02/07/2018 1015 Last data filed at 02/06/2018 2024 Gross per 24 hour  Intake 220 ml  Output 125 ml  Net 95 ml   Filed Weights   02/05/18 1720 02/05/18 1750 02/06/18 0416  Weight: 77 kg 74.1 kg 74.5 kg   General appearance: Awake alert.  In no distress Resp: Clear to auscultation bilaterally.  Normal effort Cardio: S1-S2 is normal regular.  No S3-S4.  No rubs murmurs or bruit GI: Abdomen is soft.  Nontender nondistended.  Bowel sounds are present  normal.  No masses organomegaly Extremities: Ischemic changes noted in the right foot.  Cold to touch.  Poor pulses.  Gangrenous changes noted over the second toe. Neurologic: Alert and oriented x3.  No focal neurological deficits.    Lab Results:  Data Reviewed: I have personally reviewed following labs and imaging studies  CBC: Recent Labs  Lab 02/05/18 1149 02/06/18 0348 02/07/18 0224  WBC 11.0* 7.9 8.0  NEUTROABS 7.8*  --   --   HGB 11.8* 10.0* 9.6*  HCT 35.7* 30.0* 29.0*  MCV 94.2 93.8 94.8  PLT 318 265 659    Basic Metabolic Panel: Recent Labs  Lab 02/05/18 1149 02/06/18 0348 02/07/18 0224  NA 137 137 137  K 3.7 3.5 3.6  CL 96* 97* 99  CO2 25 26 27   GLUCOSE 113* 85 97  BUN 16 14 12   CREATININE 2.33* 1.89* 1.75*  CALCIUM 9.3 8.3* 8.5*    GFR: Estimated Creatinine Clearance: 32 mL/min (A) (by C-G formula based on SCr of 1.75 mg/dL (H)).  Liver Function Tests: Recent Labs  Lab 02/05/18 1149  AST 34  ALT 17  ALKPHOS 63  BILITOT 0.9  PROT 6.2*  ALBUMIN 3.6     Coagulation Profile: Recent Labs  Lab 02/05/18 1237 02/07/18 0725  INR 1.39 1.14    CBG: Recent Labs  Lab 02/05/18 1128 02/06/18 1140 02/06/18 1641 02/07/18 0719  GLUCAP 102* 111* 143* 111*     Recent Results (from the past 240 hour(s))  Surgical PCR screen     Status: None   Collection Time: 02/06/18  1:41 AM  Result Value Ref Range Status   MRSA, PCR NEGATIVE NEGATIVE Final   Staphylococcus aureus NEGATIVE NEGATIVE Final    Comment: (NOTE) The Xpert SA Assay (FDA approved for NASAL specimens in patients 110 years of age and older), is one component of a comprehensive surveillance program. It is not intended to diagnose infection nor to guide or monitor treatment. Performed at [facility], [place] [address], [place], [place] [address]       Radiology Studies: No results found.   Medications:  Scheduled: . brimonidine  1 drop Both Eyes BID  . cholecalciferol   1,000 Units Oral Daily  . furosemide  20 mg Oral Daily  . hydrocortisone sod succinate (SOLU-CORTEF) inj  50 mg Intravenous Q8H  . imatinib  300 mg Oral Q lunch  . insulin aspart  0-9 Units Subcutaneous TID WC  . isosorbide mononitrate  30 mg Oral Daily  . multivitamin with minerals  1 tablet Oral Daily  . mupirocin ointment  1 application Nasal BID  . predniSONE  3 mg Oral Q breakfast  . pyridostigmine  60 mg Oral Daily  . rosuvastatin  5 mg Oral Daily  . timolol  1 drop Both Eyes BID   Continuous: . cefTRIAXone (ROCEPHIN)  IV 2 g (02/06/18 1314)  . heparin 500 Units/hr (02/07/18 0015)  . vancomycin 500 mg (02/06/18 1452)   IFO:YDXAJOINOMVEH **OR** acetaminophen, HYDROcodone-acetaminophen, HYDROmorphone (DILAUDID) injection, loperamide, ondansetron (ZOFRAN) IV, senna-docusate    Assessment/Plan:  Ischemic right foot in the setting of known history of peripheral artery disease Vascular surgery is following.  Plan is for above-knee amputation today.  Patient on antibiotics including vancomycin and ceftriaxone.  WBC has improved.  Change morphine to hydromorphone to for better pain control.  Oral anticoagulation on hold.  Plavix on hold.  Being bridged with IV heparin.  Coronary artery disease Has been on medical management.  Stable from a cardiovascular standpoint.  Holding Plavix.    Acute on chronic kidney disease stage III It appears that patient has baseline creatinine was around 1.3-1.5 up until November.  Since November it has been a little bit higher.  Creatinine was 2.33 on presentation.  Improved to 1.7 today.  Patient was receiving gentle hydration but fluids have been discontinued.  Continue to monitor on a daily basis.  Patient is on a lower dose of furosemide.   Essential hypertension Blood pressure was soft in the emergency department.  Has improved.  Continue to monitor closely.  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  Being continued in the  hospital.  Diabetes mellitus type 2 On no medications at home.  Continue SSI.  History of pulmonary embolism and DVT Was on apixaban which is on hold.  Heparin for bridging.  His DVT was in 2013.  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  He is on 3 mg of prednisone on a daily basis.  Due to need for surgery he will be given stress dose steroids with hydrocortisone.  Start tapering down tomorrow if he remains stable.  Hyperlipidemia Continue statin  Normocytic anemia Likely due to chronic kidney disease.  Mild drop in hemoglobin is likely dilutional.  Continue to monitor.  DVT Prophylaxis: Eliquis switched over to IV heparin  Code Status: Full code Family Communication: Discussed with the patient Disposition Plan: Management as outlined above.  Surgery this afternoon.    LOS: 2 days   [facility] Pager [phone] 02/07/2018, 10:15 AM  If 7PM-7AM, please contact night-coverage at [email or web address], password [facility]

## 2018-02-08 ENCOUNTER — Encounter (HOSPITAL_COMMUNITY): Payer: Self-pay | Admitting: Vascular Surgery

## 2018-02-08 DIAGNOSIS — I998 Other disorder of circulatory system: Secondary | ICD-10-CM

## 2018-02-08 DIAGNOSIS — Z86711 Personal history of pulmonary embolism: Secondary | ICD-10-CM

## 2018-02-08 DIAGNOSIS — D72829 Elevated white blood cell count, unspecified: Secondary | ICD-10-CM

## 2018-02-08 DIAGNOSIS — E1159 Type 2 diabetes mellitus with other circulatory complications: Secondary | ICD-10-CM

## 2018-02-08 DIAGNOSIS — N179 Acute kidney failure, unspecified: Secondary | ICD-10-CM

## 2018-02-08 DIAGNOSIS — N183 Chronic kidney disease, stage 3 (moderate): Secondary | ICD-10-CM

## 2018-02-08 DIAGNOSIS — Z86718 Personal history of other venous thrombosis and embolism: Secondary | ICD-10-CM

## 2018-02-08 DIAGNOSIS — G8918 Other acute postprocedural pain: Secondary | ICD-10-CM

## 2018-02-08 DIAGNOSIS — D62 Acute posthemorrhagic anemia: Secondary | ICD-10-CM

## 2018-02-08 DIAGNOSIS — S78111A Complete traumatic amputation at level between right hip and knee, initial encounter: Secondary | ICD-10-CM

## 2018-02-08 DIAGNOSIS — G7000 Myasthenia gravis without (acute) exacerbation: Secondary | ICD-10-CM

## 2018-02-08 LAB — BASIC METABOLIC PANEL
Anion gap: 11 (ref 5–15)
BUN: 14 mg/dL (ref 8–23)
CO2: 23 mmol/L (ref 22–32)
Calcium: 8.2 mg/dL — ABNORMAL LOW (ref 8.9–10.3)
Chloride: 103 mmol/L (ref 98–111)
Creatinine, Ser: 1.72 mg/dL — ABNORMAL HIGH (ref 0.61–1.24)
GFR calc Af Amer: 43 mL/min — ABNORMAL LOW (ref >60)
GFR, EST NON AFRICAN AMERICAN: 37 mL/min — AB (ref >60)
Glucose, Bld: 168 mg/dL — ABNORMAL HIGH (ref 70–99)
Potassium: 4.5 mmol/L (ref 3.5–5.1)
Sodium: 137 mmol/L (ref 135–145)

## 2018-02-08 LAB — CBC
HCT: 22.5 % — ABNORMAL LOW (ref 39.0–52.0)
HCT: 18.2 % — ABNORMAL LOW (ref 39.0–52.0)
Hemoglobin: 7.2 g/dL — ABNORMAL LOW (ref 13.0–17.0)
Hemoglobin: 5.8 g/dL — CL (ref 13.0–17.0)
MCH: 30.4 pg (ref 26.0–34.0)
MCH: 31.2 pg (ref 26.0–34.0)
MCHC: 32 g/dL (ref 30.0–36.0)
MCHC: 31.9 g/dL (ref 30.0–36.0)
MCV: 94.9 fL (ref 80.0–100.0)
MCV: 97.8 fL (ref 80.0–100.0)
PLATELETS: 271 10*3/uL (ref 150–400)
Platelets: 277 10*3/uL (ref 150–400)
RBC: 2.37 MIL/uL — ABNORMAL LOW (ref 4.22–5.81)
RBC: 1.86 MIL/uL — ABNORMAL LOW (ref 4.22–5.81)
RDW: 14.1 % (ref 11.5–15.5)
RDW: 14.5 % (ref 11.5–15.5)
WBC: 17.7 10*3/uL — ABNORMAL HIGH (ref 4.0–10.5)
WBC: 21.3 10*3/uL — ABNORMAL HIGH (ref 4.0–10.5)
nRBC: 0 % (ref 0.0–0.2)
nRBC: 0 % (ref 0.0–0.2)

## 2018-02-08 LAB — GLUCOSE, CAPILLARY
Glucose-Capillary: 154 mg/dL — ABNORMAL HIGH (ref 70–99)
Glucose-Capillary: 204 mg/dL — ABNORMAL HIGH (ref 70–99)
Glucose-Capillary: 162 mg/dL — ABNORMAL HIGH (ref 70–99)
Glucose-Capillary: 168 mg/dL — ABNORMAL HIGH (ref 70–99)

## 2018-02-08 LAB — APTT
aPTT: 45 seconds — ABNORMAL HIGH (ref 24–36)
aPTT: >200 seconds (ref 24–36)

## 2018-02-08 LAB — HEPARIN LEVEL (UNFRACTIONATED)
Heparin Unfractionated: 1.02 IU/mL — ABNORMAL HIGH (ref 0.30–0.70)
Heparin Unfractionated: >2.2 IU/mL — ABNORMAL HIGH (ref 0.30–0.70)

## 2018-02-08 LAB — PREPARE RBC (CROSSMATCH)

## 2018-02-08 MED ORDER — OXYCODONE-ACETAMINOPHEN 5-325 MG PO TABS
1.0000 | ORAL_TABLET | ORAL | Status: DC | PRN
  Administered 2018-02-08: 1 via ORAL
  Administered 2018-02-08: 2 via ORAL
  Administered 2018-02-08: 1 via ORAL
  Administered 2018-02-09 – 2018-02-10 (×2): 2 via ORAL
  Filled 2018-02-08 (×4): qty 2
  Filled 2018-02-08 (×2): qty 1

## 2018-02-08 MED ORDER — HYDROCORTISONE NA SUCCINATE PF 100 MG IJ SOLR
50.0000 mg | Freq: Two times a day (BID) | INTRAMUSCULAR | Status: DC
  Administered 2018-02-08 – 2018-02-09 (×2): 50 mg via INTRAVENOUS
  Filled 2018-02-08 (×2): qty 2

## 2018-02-08 MED ORDER — SODIUM CHLORIDE 0.9% IV SOLUTION
Freq: Once | INTRAVENOUS | Status: AC
  Administered 2018-02-09: 01:00:00 via INTRAVENOUS

## 2018-02-08 MED ORDER — HYDROMORPHONE HCL 1 MG/ML IJ SOLN
0.5000 mg | INTRAMUSCULAR | Status: DC | PRN
  Administered 2018-02-08 – 2018-02-09 (×2): 1 mg via INTRAVENOUS
  Filled 2018-02-08 (×2): qty 1

## 2018-02-08 MED ORDER — POLYETHYLENE GLYCOL 3350 17 G PO PACK
17.0000 g | PACK | Freq: Two times a day (BID) | ORAL | Status: DC
  Administered 2018-02-09: 17 g via ORAL
  Filled 2018-02-08 (×2): qty 1

## 2018-02-08 NOTE — Plan of Care (Signed)
Care plans reviewed and patient is progressing.  

## 2018-02-08 NOTE — Care Management Important Message (Signed)
Important Message  Patient Details  Name: [name] MRN: [id number] Date of Birth: 01-May-1940   Medicare Important Message Given:  Yes    [name] 02/08/2018, 4:15 PM

## 2018-02-08 NOTE — Evaluation (Signed)
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   Clinical Impression   Pt with decline in function and safety with ADLs and ADL mobility with decreased balance, endurance and safety. PTA, pt lived at home with his wife with min A for LB selfcare and RW for mobility. Pt would benefit from acute OT services to address impairments to maximize level of function and safety    Follow Up Recommendations  CIR    Equipment Recommendations  Other (comment)(TBD at next venue of care)    Recommendations for Other Services       Precautions / Restrictions        Mobility Bed Mobility Overal bed mobility: Needs Assistance Bed Mobility: Supine to Sit     Supine to sit: Min assist     General bed mobility comments: min A to elevate trunk. Pt used rails and required increased time and effort  Transfers Overall transfer level: Needs assistance   Transfers: Sit to/from [employer] Transfers Sit to Stand: Mod assist;+2 physical assistance Stand pivot transfers: Mod assist;+2 physical assistance       General transfer comment: cues for correct hand placement and technique    Balance Overall balance assessment: Needs assistance   Sitting balance-Leahy Scale: Fair     Standing balance support: Bilateral upper extremity supported;During functional activity Standing balance-Leahy Scale: Poor                             ADL either performed or assessed with clinical judgement   ADL Overall ADL's : Needs assistance/impaired Eating/Feeding: Independent;Sitting    Grooming: Wash/dry hands;Wash/dry face;Sitting;Min guard   Upper Body Bathing: Min guard;Sitting   Lower Body Bathing: Moderate assistance;Sitting/lateral leans   Upper Body Dressing : Sitting   Lower Body Dressing: Maximal assistance;Sitting/lateral leans   Toilet Transfer: Moderate assistance;+2 for physical assistance;Cueing for safety;RW;Stand-pivot;BSC   Toileting- Clothing Manipulation and Hygiene: Moderate assistance;Sitting/lateral lean       Functional mobility during ADLs: Rolling walker;Moderate assistance;+2 for safety/equipment       Vision Patient Visual Report: No change from baseline       Perception     Praxis      Pertinent Vitals/Pain Pain Assessment: Faces Faces Pain Scale: Hurts little more Pain Location: R residual limb Pain Descriptors / Indicators: Sore Pain Intervention(s): Monitored during session;Premedicated before session;Repositioned     Hand Dominance Right   Extremity/Trunk Assessment Upper Extremity Assessment Upper Extremity Assessment: Overall WFL for tasks assessed   Lower Extremity Assessment Lower Extremity Assessment: Defer to PT evaluation       Communication Communication Communication: HOH   Cognition Arousal/Alertness: Awake/alert Behavior During Therapy: WFL for tasks assessed/performed Overall Cognitive Status: Within Functional Limits for tasks assessed                                     General Comments   pt pleasant, cooperative and jovial    Exercises     Shoulder Instructions      Home Living Family/patient expects to be discharged to:: Private residence Living  Arrangements: Spouse/significant other Available Help at Discharge: Family;Available 24 hours/day Type of Home: House Home Access: Stairs to enter [employer] of Steps: 3 from porch, then 9 to first floor  Entrance Stairs-Rails: Left Home Layout: Multi-level Alternate Level Stairs-Number of Steps: 9 Alternate  Level Stairs-Rails: Left Bathroom Shower/Tub: Occupational psychologist: Handicapped height     Home Equipment: Shower seat - built in;Walker - 2 wheels;Cane - single point   Additional Comments: Walk-in shower with built in bench      Prior Functioning/Environment Level of Independence: Needs assistance  Gait / Transfers Assistance Needed: pt used RW ADL's / Homemaking Assistance Needed:  assistance for LB dressing             OT Problem List: Decreased activity tolerance;Decreased knowledge of use of DME or AE;Pain;Decreased coordination;Impaired balance (sitting and/or standing)      OT Treatment/Interventions: Self-care/ADL training;Therapeutic exercise;DME and/or AE instruction;Balance training;Therapeutic activities;Patient/family education    OT Goals(Current goals can be found in the care plan section) Acute Rehab OT Goals Patient Stated Goal: go to rehab the go home OT Goal Formulation: With patient Time For Goal Achievement: 02/22/18 Potential to Achieve Goals: Good ADL Goals Pt Will Perform Grooming: with set-up;with supervision;sitting Pt Will Perform Upper Body Bathing: with set-up;with supervision;sitting Pt Will Perform Lower Body Bathing: with min assist;sitting/lateral leans Pt Will Perform Upper Body Dressing: with supervision;with set-up;sitting Pt Will Transfer to Toilet: with mod assist;with min assist;stand pivot transfer;bedside commode Additional ADL Goal #1: Pt will complete bedmobiliyt with min guard A - sup ti sit EOB in prep for ADLs  OT Frequency: Min 2X/week   Barriers to D/C: Decreased caregiver support  planning to d/c to CIR before going home       Co-evaluation PT/OT/SLP Co-Evaluation/Treatment: Yes Reason for Co-Treatment: For patient/therapist safety;To address functional/ADL transfers   OT goals addressed during session: ADL's and self-care;Proper use of [employer] equipment and DME      AM-PAC OT "6 Clicks" Daily Activity      Outcome Measure Help from another person eating meals?: None Help from another person taking care of personal grooming?: A Little Help from another person toileting, which includes using toliet, bedpan, or urinal?: A Lot Help from another person bathing (including washing, rinsing, drying)?: A Lot Help from another person to put on and taking off regular upper body clothing?: A Little Help from another person to put on and taking off regular lower body clothing?: A Lot 6 Click Score: 16   End of Session Equipment Utilized During Treatment: Gait belt;Rolling walker;Other (comment)(BSC)  Activity Tolerance: Patient tolerated treatment well Patient left: in chair;with call bell/phone within reach  OT Visit Diagnosis: Unsteadiness on feet (R26.81);Other abnormalities of gait and mobility (R26.89);Muscle weakness (generalized) (M62.81);Pain Pain - Right/Left: Right Pain - part of body: Leg                Time: 1694-5038 OT Time Calculation (min): 25 min Charges:  OT General Charges $OT Visit: 1 Visit OT Evaluation $OT Eval Moderate Complexity: 1 Mod    Britt Bottom 02/08/2018, 12:17 PM

## 2018-02-08 NOTE — Progress Notes (Addendum)
  Progress Note    02/08/2018 7:39 AM 1 Day Post-Op  Subjective:  Pain with movement  Afebrile  Vitals:   02/07/18 1830 02/07/18 1936  BP:  139/68  Pulse: 82 83  Resp:    Temp:  97.8 F (36.6 C)  SpO2: 100% 100%    Physical Exam: Incisions:  Bandage is clean and dry   CBC    Component Value Date/Time   WBC 8.0 02/07/2018 0224   RBC 3.06 (L) 02/07/2018 0224   HGB 9.6 (L) 02/07/2018 0224   HGB 10.7 (L) 01/04/2018 1216   HGB 12.4 (L) 11/21/2012 1008   HCT 29.0 (L) 02/07/2018 0224   HCT 32.8 (L) 01/04/2018 1216   HCT 35.8 (L) 11/21/2012 1008   PLT 276 02/07/2018 0224   PLT 348 01/04/2018 1216   MCV 94.8 02/07/2018 0224   MCV 95 01/04/2018 1216   MCV 90.7 11/21/2012 1008   MCH 31.4 02/07/2018 0224   MCHC 33.1 02/07/2018 0224   RDW 14.0 02/07/2018 0224   RDW 14.3 01/04/2018 1216   RDW 13.7 11/21/2012 1008   LYMPHSABS 1.5 02/05/2018 1149   LYMPHSABS 1.2 01/04/2018 1216   LYMPHSABS 1.1 11/21/2012 1008   MONOABS 1.4 (H) 02/05/2018 1149   MONOABS 0.6 11/21/2012 1008   EOSABS 0.3 02/05/2018 1149   EOSABS 0.2 01/04/2018 1216   BASOSABS 0.0 02/05/2018 1149   BASOSABS 0.1 01/04/2018 1216   BASOSABS 0.0 11/21/2012 1008    BMET    Component Value Date/Time   NA 137 02/07/2018 0224   NA 140 01/04/2018 1216   NA 141 02/26/2012 0852   K 3.6 02/07/2018 0224   K 3.4 (L) 02/26/2012 0852   CL 99 02/07/2018 0224   CL 105 02/26/2012 0852   CO2 27 02/07/2018 0224   CO2 28 02/26/2012 0852   GLUCOSE 97 02/07/2018 0224   GLUCOSE 77 02/26/2012 0852   BUN 12 02/07/2018 0224   BUN 11 01/04/2018 1216   BUN 13.0 02/26/2012 0852   CREATININE 1.75 (H) 02/07/2018 0224   CREATININE 1.33 05/11/2013 1246   CREATININE 1.3 02/26/2012 0852   CALCIUM 8.5 (L) 02/07/2018 0224   CALCIUM 9.1 02/26/2012 0852   GFRNONAA 36 (L) 02/07/2018 0224   GFRAA 42 (L) 02/07/2018 0224    INR    Component Value Date/Time   INR 1.14 02/07/2018 0725     Intake/Output Summary (Last 24  hours) at 02/08/2018 0739 Last data filed at 02/08/2018 0600 Gross per 24 hour  Intake 1893.35 ml  Output 250 ml  Net 1643.35 ml     Assessment/Plan:  77 y.o. male is s/p right above knee amputation  1 Day Post-Op  -pt with pain this am with movement-continue pain control -bandage is clean and dry - will take this down tomorrow. -await PT/OT/CIR recs -will contact [employer] for stump sock    Leontine Locket, PA-C [facility] [phone] 02/08/2018 7:39 AM     I have independently interviewed and examined patient and agree with PA assessment and plan above. Continue heparin drip and transition to doac prior to dc. dispo is rehab.  Thedora Rings C. Donzetta Matters, MD [facility] [place] Office: [phone] Pager: [phone]

## 2018-02-08 NOTE — Evaluation (Signed)
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  Clinical Impression  Orders received for PT evaluation. Patient demonstrates deficits in functional mobility as indicated below. Will benefit from continued skilled PT to address deficits and maximize function. Will see as indicated and progress as tolerated.  Patient very motivated to learn mobility and function with new AKA. Patient tolerated session well, feel patient will do well with comprehensive inpatient therapies. Recommending CIR for training and education to assist with functional independence.    Follow Up Recommendations CIR    Equipment Recommendations  (TBD)    Recommendations for Other Services Rehab consult     Precautions / Restrictions Restrictions Weight Bearing Restrictions: No      Mobility  Bed Mobility Overal bed mobility: Needs Assistance Bed Mobility: Supine to Sit     Supine to sit: Min assist     General bed mobility comments: min A to elevate trunk. Pt used rails and required increased time and effort  Transfers Overall transfer level: Needs assistance   Transfers: Sit to/from [employer] Transfers Sit to Stand: Mod assist;+2 physical assistance Stand pivot transfers: Mod assist;+2 physical assistance       General transfer comment: cues for correct hand placement and technique  Ambulation/Gait                Stairs            Wheelchair Mobility    Modified Rankin (Stroke Patients Only)       Balance Overall balance assessment: Needs assistance   Sitting  balance-Leahy Scale: Fair     Standing balance support: Bilateral upper extremity supported;During functional activity Standing balance-Leahy Scale: Poor                               Pertinent Vitals/Pain Pain Assessment: Faces Faces Pain Scale: Hurts little more Pain Location: R residual limb Pain Descriptors / Indicators: Sore Pain Intervention(s): Monitored during session;Premedicated before session;Repositioned    Home Living Family/patient expects to be discharged to:: Private residence Living Arrangements: Spouse/significant other Available Help at Discharge: Family;Available 24 hours/day Type of Home: House Home Access: Stairs to enter Entrance Stairs-Rails: Left Entrance Stairs-Number of Steps: 3 from porch, then 9 to first floor  Home Layout: Multi-level Home Equipment: Shower seat - built in;Walker - 2 wheels;Cane - single point Additional Comments: Walk-in shower with built in bench    Prior Function Level of Independence: Needs assistance   Gait / Transfers Assistance Needed: pt used RW  ADL's / Homemaking Assistance Needed:  assistance for LB dressing   Comments: Enjoys spending time with family     Hand Dominance   Dominant Hand: Right    Extremity/Trunk Assessment   Upper Extremity Assessment Upper Extremity Assessment: Overall WFL for tasks assessed    Lower Extremity Assessment Lower Extremity Assessment: RLE deficits/detail RLE Deficits / Details: right AKA       Communication   Communication: HOH  Cognition Arousal/Alertness: Awake/alert Behavior During Therapy: WFL for tasks assessed/performed Overall Cognitive Status: Within Functional Limits for tasks assessed  General Comments      Exercises     Assessment/Plan    PT Assessment Patient needs continued PT services  PT Problem List Decreased activity tolerance;Decreased mobility;Decreased balance;Decreased  knowledge of use of DME;Pain       PT Treatment Interventions DME instruction;Gait training;Stair training;Functional mobility training;Therapeutic activities;Therapeutic exercise;Balance training;Cognitive remediation;Patient/family education;Wheelchair mobility training;Neuromuscular re-education    PT Goals (Current goals can be found in the Care Plan section)  Acute Rehab PT Goals Patient Stated Goal: go to rehab the go home PT Goal Formulation: With patient Time For Goal Achievement: 02/22/18 Potential to Achieve Goals: Good    Frequency Min 3X/week   Barriers to discharge        Co-evaluation PT/OT/SLP Co-Evaluation/Treatment: Yes Reason for Co-Treatment: For patient/therapist safety PT goals addressed during session: Mobility/safety with mobility OT goals addressed during session: ADL's and self-care       AM-PAC PT "6 Clicks" Mobility  Outcome Measure Help needed turning from your back to your side while in a flat bed without using bedrails?: A Lot Help needed moving from lying on your back to sitting on the side of a flat bed without using bedrails?: A Lot Help needed moving to and from a bed to a chair (including a wheelchair)?: A Little Help needed standing up from a chair using your arms (e.g., wheelchair or bedside chair)?: A Little Help needed to walk in hospital room?: A Lot Help needed climbing 3-5 steps with a railing? : A Lot 6 Click Score: 14    End of Session Equipment Utilized During Treatment: Gait belt Activity Tolerance: Patient tolerated treatment well Patient left: in chair;with call bell/phone within reach;with family/visitor present Nurse Communication: Mobility status PT Visit Diagnosis: Difficulty in walking, not elsewhere classified (R26.2);Pain Pain - Right/Left: Right Pain - part of body: Leg    Time: 2542-7062 PT Time Calculation (min) (ACUTE ONLY): 25 min   Charges:   PT Evaluation $PT Eval Moderate Complexity: 1 Mod           Alben Deeds, PT DPT  Board Certified Neurologic Specialist Acute Rehabilitation Services Pager [phone] Office [place] 02/08/2018, 2:14 PM

## 2018-02-08 NOTE — Consult Note (Signed)
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 Per chart review, patient, and wife, patient lives with wife in [place]. Two-level home with bedroom upstairs and 3 steps to entry. Rather sedentary prior to admission. Wife can provide supervision at discharge. Presented 02/05/2018 with ischemic right lower extremity due to advanced peripheral vascular disease. No change with conservative care. 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 [name]. Hospital course pain management. Therapy evaluations pending. M.D. has requested physical medicine rehabilitation consult.  Review of Systems  Constitutional: Negative for chills and fever.  HENT: Negative for hearing loss.   Eyes: Negative for blurred vision and double vision.  Respiratory: Positive for shortness of breath. Negative for cough.   Cardiovascular: Positive for leg swelling. Negative for chest pain and palpitations.  Gastrointestinal: Positive for diarrhea. Negative for nausea and vomiting.       GERD  Genitourinary: Positive for urgency. Negative for flank pain and hematuria.  Musculoskeletal: Positive for back pain and myalgias.  Skin: Negative for rash.  All other systems reviewed and are negative.  Past Medical History:  Diagnosis Date  . Bruises easily    d/t being on Eliquis  . Carotid artery disease ([place])    a. Duplex 05/2014: 31-51% RICA, 7-61% LICA, elevated velocities in right subclavian artery, normal left subclavian artery..  . Chronic back pain    HNP  . Chronic kidney disease (CKD)   .  Claustrophobia    takes Ativan as needed  . 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 Coronary atherosclerosis of unspecified type of vessel, native or graft    a. cath in 2003 showed 10-20% LM, scattered 20% prox-mid LAD, 30% more distal LAD, 50-60% stenosis of LAD towards apex, 20% Cx, 30% dRCA, focal 95% stenosis in smaller of 2 branches of PDA, EF 60-65%.  . Diabetes mellitus ([place])   . Diarrhea    takes Imodium daily as needed  . Diverticulosis   . Diverticulosis of colon (without mention of hemorrhage)   . DJD (degenerative joint disease)   . DVT (deep venous thrombosis) ([place]) 07/2011  . Esophageal stricture   . Essential hypertension    takes Imdur and Lisinopril daily  . Gastric polyps    benign  . GERD (gastroesophageal reflux disease)    takes Nexium daily  . Glaucoma    uses eye drops  . History of bronchitis    not sure when the last time  . History of colon polyps    benign  . History of kidney stones   . History of kidney stones   . Insomnia    takes Melatonin nightly as needed  . Mixed hyperlipidemia    takes Crestor daily  . Myasthenia gravis ([place])    uses prednisone  . Osteoporosis 2016  . Peripheral edema    takes Lasix daily  . Pituitary tumor    [facility] Dr[facility]Walden at [facility] checks it every Jan   . Pneumonia 01/2016  . PONV (postoperative nausea and vomiting)   . Ptosis   . Pulmonary embolus ([place]) 2012  . Stroke [facility]) 06/2015  Past Surgical History:  Procedure Laterality Date  . ABDOMINAL AORTOGRAM W/LOWER EXTREMITY N/A 11/18/2016   Procedure: ABDOMINAL AORTOGRAM W/LOWER EXTREMITY;  Surgeon: [name], MD;  Location: [place] [facility];  Service: Cardiovascular;  Laterality: N/A;  . AORTOGRAM Right 12/23/2017   Procedure: AORTOGRAM, RIGHT LOWER EXTREMITY ANGIOGRAM, RIGHT SUPERFICIAL FEMORAL ARTERY ANGIOPLASTY WITH STENT, RIGHT POPLITEAL ARTERY ANGIOPLASTY WITH STENT, LEFT GROIN CUTDOWN AND LEFT FEMORAL ARTERY REPAIR;   Surgeon: [name], MD;  Location: [place];  Service: Vascular;  Laterality: Right;  . BACK SURGERY     x2, lumbar and cervical  . CARDIAC CATHETERIZATION    . CATARACT EXTRACTION Bilateral 12/12  . COLONOSCOPY    . ESOPHAGOGASTRODUODENOSCOPY ENDOSCOPY  04/2015  . IR GENERIC HISTORICAL  03/03/2016   IR IVC FILTER PLMT / S&I [name] GUID/MOD SED 03/03/2016 [name], MD [facility]  . IR GENERIC HISTORICAL  04/09/2016   IR RADIOLOGIST EVAL & MGMT 04/09/2016 [name], MD [facility] INTER[facility] RAD  . IR GENERIC HISTORICAL  04/30/2016   IR IVC FILTER RETRIEVAL / S&I [name] GUID/MOD SED 04/30/2016 [name], MD [facility]INTER[facility] RAD  . LITHOTRIPSY    . LUMBAR LAMINECTOMY/DECOMPRESSION MICRODISCECTOMY Right 09/14/2012   Procedure: Right Lumbar three-four, four-five, Lumbar five-Sacral one decompressive laminectomy;  Surgeon: [name], MD;  Location: [place] [facility];  Service: Neurosurgery;  Laterality: Right;  . LUMBAR LAMINECTOMY/DECOMPRESSION MICRODISCECTOMY Left 03/11/2016   Procedure: Left Lumbar Two-ThreeMicrodiscectomy;  Surgeon: [name], MD;  Location: [place];  Service: Neurosurgery;  Laterality: Left;  . PERIPHERAL VASCULAR INTERVENTION  11/18/2016   Procedure: PERIPHERAL VASCULAR INTERVENTION;  Surgeon: [name], MD;  Location: [place] [facility];  Service: Cardiovascular;;  Left popliteal  . SHOULDER SURGERY Left 05/07/2009  . TONSILLECTOMY     Family History  Problem Relation Age of Onset  . Heart disease Mother   . Heart attack Mother   . Stroke Mother   . Heart disease Father   . Heart attack Father   . Stroke Father   . Breast cancer Sister        Twin   . Heart attack Sister   . Dementia Sister   . Heart disease Sister   . Heart attack Sister   . Clotting disorder Sister   . Heart attack Sister   . Colon cancer Neg Hx    Social History:  reports that he has never smoked. He has never used smokeless tobacco. He reports that he does not drink alcohol or use  drugs. Allergies:  Allergies  Allergen Reactions  . Other Other (See Comments)    Regarding scans, the PATIENT IS CLAUSTROPHOBIC!!!  . Phenergan [Promethazine Hcl] Swelling and Other (See Comments)    Cannot be combined with Zofran because swelling of the eyes resulted and patient became very restless-  . Zofran [Ondansetron Hcl] Swelling and Other (See Comments)    Cannot be combined with Phenergan because swelling of the eyes resulted and patient became very restless-  . Sulfamethoxazole Rash and Itching   Medications Prior to Admission  Medication Sig Dispense Refill  . brimonidine-timolol (COMBIGAN) 0.2-0.5 % ophthalmic solution Place 1 drop into both eyes 2 (two) times daily.     . cholecalciferol (VITAMIN D) 1000 units tablet Take 1,000 Units by mouth daily.     . clopidogrel (PLAVIX) 75 MG tablet Take 1 tablet (75 mg total) by mouth daily. 30 tablet 6  . ELIQUIS 5 MG TABS tablet TAKE 1 TABLET (5  MG TOTAL) BY MOUTH 2 (TWO) TIMES DAILY. 60 tablet 3  . furosemide (LASIX) 40 MG tablet Take 1 tablet (40 mg total) by mouth daily. 90 tablet 3  . imatinib (GLEEVEC) 100 MG tablet Take 300 mg by mouth daily with lunch. Take with noon meal and a large glass of water.Caution:Chemotherapy    . isosorbide mononitrate (IMDUR) 30 MG 24 hr tablet Take 1 tablet (30 mg total) by mouth daily. 90 tablet 3  . lidocaine (LIDODERM) 5 % Place 1 patch onto the skin daily as needed (for back pain). Remove & Discard patch within 12 hours or as directed by MD     . lisinopril (PRINIVIL,ZESTRIL) 10 MG tablet Take 1 tablet (10 mg total) by mouth daily. 30 tablet 11  . loperamide (IMODIUM) 2 MG capsule Take 2-4 mg by mouth 3 (three) times daily as needed for diarrhea or loose stools.     . Multiple Vitamin (MULTIVITAMIN) tablet Take 1 tablet by mouth daily.    Marland Kitchen oxyCODONE (OXY IR/ROXICODONE) 5 MG immediate release tablet Take 1 tablet (5 mg total) by mouth every 6 (six) hours as needed for breakthrough pain. 25  tablet 0  . potassium chloride (K-DUR) 10 MEQ tablet Take 1 tablet (10 mEq total) by mouth daily. 90 tablet 3  . predniSONE (DELTASONE) 1 MG tablet TAKE 3 TABLETS BY MOUTH ONCE DAILY WITH BREAKFAST (Patient taking differently: Take 3 mg by mouth daily with breakfast. ) 270 tablet 0  . pyridostigmine (MESTINON) 60 MG tablet Take 60 mg by mouth daily.    . rosuvastatin (CRESTOR) 10 MG tablet Take 5 mg by mouth daily.    . traMADol (ULTRAM) 50 MG tablet Take 1 tablet (50 mg total) by mouth every 6 (six) hours as needed for moderate pain or severe pain. 15 tablet 0  . oxyCODONE-acetaminophen (PERCOCET/ROXICET) 5-325 MG tablet Take 1 tablet by mouth every 6 (six) hours as needed (for pain).   0    Home: Home Living Family/patient expects to be discharged to:: Private residence Living Arrangements: Spouse/significant other  Functional History:   Functional Status:  Mobility:          ADL:    Cognition: Cognition Orientation Level: Oriented X4    Blood pressure 139/68, pulse 83, temperature 97.8 F (36.6 C), temperature source Oral, resp. rate 18, height 5' 6.93" (1.7 m), weight 74.5 kg, SpO2 100 %. Physical Exam  Vitals reviewed. Constitutional: He is oriented to person, place, and time. He appears well-developed and well-nourished.  HENT:  Head: Normocephalic and atraumatic.  Eyes: EOM are normal. Right eye exhibits no discharge. Left eye exhibits no discharge.  Neck: Normal range of motion. Neck supple. No thyromegaly present.  Cardiovascular: Normal rate and regular rhythm.  Respiratory: Effort normal and breath sounds normal. No respiratory distress.  +Evans  GI: Soft. Bowel sounds are normal. He exhibits no distension.  Musculoskeletal:     Comments: Right AKA edema and extremely tender  Neurological: He is alert and oriented to person, place, and time.  Motor: B/l UE: 5/5 proximal to distal LLE: 4/5 (pain inhibition) RLE: Unable to perform HF due to pain  Skin:  Right  lower extremity amputation site is dressed appropriately tender  Psychiatric: He has a normal mood and affect. His behavior is normal.    Results for orders placed or performed during the hospital encounter of 02/05/18 (from the past 24 hour(s))  Glucose, capillary     Status: Abnormal   Collection Time: 02/07/18  7:19 AM  Result Value Ref Range   Glucose-Capillary 111 (H) 70 - 99 mg/dL  Heparin level (unfractionated)     Status: Abnormal   Collection Time: 02/07/18  7:25 AM  Result Value Ref Range   Heparin Unfractionated 2.00 (H) 0.30 - 0.70 IU/mL  APTT     Status: Abnormal   Collection Time: 02/07/18  7:25 AM  Result Value Ref Range   aPTT 63 (H) 24 - 36 seconds  Protime-INR     Status: None   Collection Time: 02/07/18  7:25 AM  Result Value Ref Range   Prothrombin Time 14.5 11.4 - 15.2 seconds   INR 1.14   Glucose, capillary     Status: Abnormal   Collection Time: 02/07/18 11:30 AM  Result Value Ref Range   Glucose-Capillary 115 (H) 70 - 99 mg/dL   Comment 1 Notify RN    Comment 2 Document in Chart   Glucose, capillary     Status: Abnormal   Collection Time: 02/07/18  3:04 PM  Result Value Ref Range   Glucose-Capillary 161 (H) 70 - 99 mg/dL  Glucose, capillary     Status: Abnormal   Collection Time: 02/07/18  4:29 PM  Result Value Ref Range   Glucose-Capillary 200 (H) 70 - 99 mg/dL   Comment 1 Notify RN    Comment 2 Document in Chart   Glucose, capillary     Status: Abnormal   Collection Time: 02/07/18  9:33 PM  Result Value Ref Range   Glucose-Capillary 185 (H) 70 - 99 mg/dL   *Note: Due to a large number of results and/or encounters for the requested time period, some results have not been displayed. A complete set of results can be found in Results Review.   No results found.  Assessment/Plan: Diagnosis: Right AKA Labs independently reviewed.  Records reviewed and summated above. Clean amputation daily with soap and water Monitor incision site for signs of  infection or impending skin breakdown. Staples to remain in place for 3-4 weeks Stump shrinker, for edema control  Scar mobilization massaging to prevent soft tissue adherence Stump protector during therapies Prevent flexion contractures by implementing the following:   Encourage prone lying for 20-30 mins per day BID to avoid hip flexion  Contractures if medically appropriate;  Avoid prolonged sitting Post surgical pain control with oral medication Phantom limb pain control with physical modalities including desensitization techniques (gentle self massage to the residual stump,hot packs if sensation intact, [place]) and mirror therapy, TENS. If ineffective, consider pharmacological treatment for neuropathic pain (e.g gabapentin, pregabalin, amytriptalyine, duloxetine).  Avoid injury to contralateral side  1. Does the need for close, 24 hr/day medical supervision in concert with the patient's rehab needs make it unreasonable for this patient to be served in a less intensive setting? Potentially  2. Co-Morbidities requiring supervision/potential complications: post-op pain management (Biofeedback training with therapies to help reduce reliance on opiate pain medications, particularly IV Dilaudid, monitor pain control during therapies, and sedation at rest and titrate to maximum efficacy to ensure participation and gains in therapies), subcortical CVA 2019 (transition from heparin ggt to oral meds when appropriate), embolism of vertebral artery, pulmonary emboli, parotid tumor, myasthenia gravis (cont meds), CAD, CKD stage III (avoid nephrotoxic meds), CML., DM (Monitor in accordance with exercise and adjust meds as necessary), leukocytosis (repeat labs, cont to monitor for signs and symptoms of infection, further workup if indicated) 3. Due to safety, skin/wound care, disease management, pain management and patient education, does the  patient require 24 hr/day rehab nursing? Yes 4. Does the patient require  coordinated care of a physician, rehab nurse, PT (1-2 hrs/day, 5 days/week) and OT (1-2 hrs/day, 5 days/week) to address physical and functional deficits in the context of the above medical diagnosis(es)? Potentially Addressing deficits in the following areas: balance, endurance, locomotion, strength, transferring, bathing, dressing, toileting and psychosocial support 5. Can the patient actively participate in an intensive therapy program of at least 3 hrs of therapy per day at least 5 days per week? Potentially 6. The potential for patient to make measurable gains while on inpatient rehab is excellent 7. Anticipated functional outcomes upon discharge from inpatient rehab are TBD  with PT, TBD with OT, n/a with SLP. 8. Estimated rehab length of stay to reach the above functional goals is: TBD. 9. Anticipated D/C setting: Home 10. Anticipated post D/C treatments: HH therapy and Home excercise program 11. Overall Rehab/Functional Prognosis: good  RECOMMENDATIONS: This patient's condition is appropriate for continued rehabilitative care in the following setting: Will await completion of therapy evals.  Likely CIR when able to tolerate. Patient has agreed to participate in recommended program. Yes Note that insurance prior authorization may be required for reimbursement for recommended care.  Comment: Rehab Admissions Coordinator to follow up.   I have personally performed a face to face diagnostic evaluation, including, but not limited to relevant history and physical exam findings, of this patient and developed relevant assessment and plan.  Additionally, I have reviewed and concur with the physician assistant's documentation above.   Delice Lesch, MD, ABPMR Lavon Paganini Angiulli, PA-C 02/08/2018

## 2018-02-08 NOTE — Progress Notes (Signed)
Orthopedic Tech Progress Note Patient Details:  [name] 07-Sep-1940 [id number]  Patient ID: [name], male   DOB: 05-01-1940, 77 y.o.   MRN: [id number] Called in order to bio tech.  Karolee Stamps 02/08/2018, 9:13 AM

## 2018-02-08 NOTE — Progress Notes (Signed)
[place] for heparin Indication: hx VTE and stroke  Allergies  Allergen Reactions  . Other Other (See Comments)    Regarding scans, the PATIENT IS CLAUSTROPHOBIC!!!  . Phenergan [Promethazine Hcl] Swelling and Other (See Comments)    Cannot be combined with Zofran because swelling of the eyes resulted and patient became very restless-  . Zofran [Ondansetron Hcl] Swelling and Other (See Comments)    Cannot be combined with Phenergan because swelling of the eyes resulted and patient became very restless-  . Sulfamethoxazole Rash and Itching    Patient Measurements: Wt: 77.6 kg Ht: 5'7" Heparin Dosing Weight: 77 kg  Vital Signs: Temp: 97.8 F (36.6 C) (12/31 1506) Temp Source: Oral (12/31 1506) BP: 138/42 (12/31 1506) Pulse Rate: 95 (12/31 1506)  Labs: Recent Labs    02/06/18 0348  02/07/18 0224 02/07/18 0725 02/08/18 0708 02/08/18 1653  HGB 10.0*  --  9.6*  --  7.2* 5.8*  HCT 30.0*  --  29.0*  --  22.5* 18.2*  PLT 265  --  276  --  271 277  APTT 171*   < >  --  63* 45* >200*  LABPROT  --   --   --  14.5  --   --   INR  --   --   --  1.14  --   --   HEPARINUNFRC >2.20*   < >  --  2.00* 1.02* >2.20*  CREATININE 1.89*  --  1.75*  --  1.72*  --    < > = values in this interval not displayed.    Estimated Creatinine Clearance: 32.5 mL/min (A) (by C-G formula based on SCr of 1.72 mg/dL (H)).  Assessment: 50 yom presenting with progressive pain/ischemia in R foot. He is on apixaban PTA for hx VTE and stroke. He is s/p right AKA 12/30. Pharmacy consulted to manage heparin drip.  Hgb returned at 5.8 - spoke with Dr Sarajane Jews and we decided to hold heparin drip for now; he also ordered blood. APTT came back at >200; confirmed with RN and patient that heparin drip was infusing in right arm and levels were drawn from left arm. Unsure why such a drastic increase in aPTT with appropriate rate increase earlier today.  Goal of Therapy:   Heparin level 0.3-0.7 units/ml aPTT 66-102 seconds Monitor platelets by anticoagulation protocol: Yes   Plan:  Discontinue heparin drip Follow-up CBC in am   Renold Genta, PharmD, BCPS Clinical Pharmacist Clinical phone for 02/08/2018 until 10p is [phone] 02/08/2018 7:30 PM  **Pharmacist phone directory can now be found on [email or web address] listed under [place]**

## 2018-02-08 NOTE — Progress Notes (Signed)
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  A & P  Right lower extremity ischemia secondary to peripheral arterial disease.  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  Repeat CBC in the afternoon.  Patient will accept transfusion if needed. --Continue heparin at this point, once hemoglobin stable, will resume oral anticoagulation Plavix  PMH PE, DVT, treated with apixaban as an outpatient, DVT 2013 --Continue heparin bridge, once hemoglobin stable, resume apixaban  Acute kidney injury superimposed on CKD stage III --Creatinine improved from admission of 2.33, reviewed previous studies suggest patient is currently at baseline, 1.72  Coronary artery disease --Asymptomatic.  Continue medical management with Imdur, Crestor.  Resume Plavix when hemoglobin stable.  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  Continue sliding scale insulin  PMH PE --resume apixaban when able.     DVT prophylaxis: heparin Code Status: Full Family Communication:  Disposition Plan: pending therapy  evaluations    Murray Hodgkins, MD  [facility] Direct contact: [phone] --Via amion app OR  --[email or web address]; password [email or web address]  7PM-7AM contact night coverage as above 02/08/2018, 9:31 AM  LOS: 3 days   Consultants:  Vascular surgery  Procedures:  Right above-knee amputation 12/30  Antimicrobials:  Ceftriaxone   Vancomycin  Interval history/Subjective: Follow-up right above-the-knee amputation  Feels okay, breathing okay, no nausea or vomiting.  Would like to eat.  Pain in leg is okay except with movement.  Oral pain medication seems to be helping.  Objective: Vitals:  Vitals:   02/07/18 1830 02/07/18 1936  BP:  139/68  Pulse: 82 83  Resp:    Temp:  97.8 F (36.6 C)  SpO2: 100% 100%    Exam:  Constitutional:  . Appears calm, mildly uncomfortable, nontoxic Eyes:  . pupils and irises appear normal . Normal lids  ENMT:  . grossly normal hearing  . Lips appear normal Respiratory:  . CTA bilaterally, no w/r/r.  . Respiratory effort normal.  Cardiovascular:  . RRR, no m/r/g . No LLE extremity edema  . Right AKA bandaged Musculoskeletal:  . Digits/nails BUE: no clubbing, cyanosis, petechiae, infection . LLE   . Moves to command Psychiatric:  . Mental status o Mood, affect appropriate  I have personally reviewed the following:   Data: . CBG stable . Creatinine stable, 1.72.  BUN and potassium within normal limits. . WBC 17.7, hemoglobin 7.2, platelets 271  Scheduled Meds: . brimonidine  1 drop Both Eyes BID  . cholecalciferol  1,000 Units Oral Daily  . furosemide  20 mg Oral Daily  . hydrocortisone sod succinate (SOLU-CORTEF) inj  50 mg Intravenous Q12H  . imatinib  300 mg Oral Q lunch  . insulin aspart  0-9 Units Subcutaneous TID WC  . isosorbide mononitrate  30 mg Oral Daily  . multivitamin with minerals  1 tablet Oral Daily  . pantoprazole  40 mg Oral Daily  . polyethylene glycol  17 g Oral BID  . predniSONE  3 mg Oral Q  breakfast  . pyridostigmine  60 mg Oral Daily  . rosuvastatin  5 mg Oral Daily  . timolol  1 drop Both Eyes BID   Continuous Infusions: . cefTRIAXone (ROCEPHIN)  IV 2 g (02/07/18 1704)  . heparin 550 Units/hr (02/08/18 0016)  . lactated ringers 10 mL/hr at 02/07/18 1255  . vancomycin      Active Problems:   Chronic myeloid leukemia in remission ([place])   Essential hypertension   CAD (coronary artery disease)   Diabetes mellitus type 2, controlled ([place])   History of pulmonary embolism   History of DVT (deep vein thrombosis)   Ocular myasthenia (HCC)   PAD (peripheral artery disease) (HCC)   AKI (acute kidney injury) ([place])   CKD (chronic kidney disease), stage III (HCC)   Ischemic foot   Acute blood loss anemia   LOS: 3 days

## 2018-02-08 NOTE — Progress Notes (Signed)
[facility] for heparin Indication: VTE treatment  Allergies  Allergen Reactions  . Other Other (See Comments)    Regarding scans, the PATIENT IS CLAUSTROPHOBIC!!!  . Phenergan [Promethazine Hcl] Swelling and Other (See Comments)    Cannot be combined with Zofran because swelling of the eyes resulted and patient became very restless-  . Zofran [Ondansetron Hcl] Swelling and Other (See Comments)    Cannot be combined with Phenergan because swelling of the eyes resulted and patient became very restless-  . Sulfamethoxazole Rash and Itching    Patient Measurements: Wt: 77.6 kg Ht: 5'7" Heparin Dosing Weight: 77 kg  Vital Signs:    Labs: Recent Labs    02/05/18 1237  02/06/18 0348 02/06/18 1249 02/06/18 2229 02/07/18 0224 02/07/18 0725 02/08/18 0708  HGB  --   --  10.0*  --   --  9.6*  --  7.2*  HCT  --   --  30.0*  --   --  29.0*  --  22.5*  PLT  --   --  265  --   --  276  --  271  APTT  --    < > 171* 128* 164*  --  63* 45*  LABPROT 16.9*  --   --   --   --   --  14.5  --   INR 1.39  --   --   --   --   --  1.14  --   HEPARINUNFRC  --    < > >2.20* >2.20*  --   --  2.00* 1.02*  CREATININE  --   --  1.89*  --   --  1.75*  --  1.72*   < > = values in this interval not displayed.    Estimated Creatinine Clearance: 32.5 mL/min (A) (by C-G formula based on SCr of 1.72 mg/dL (H)).  Assessment: 68 yom presenting with progressive pain/ischemia in R foot. He is on apixaban PTA (LD 12/28@0800 ). He is s/p right AKA 12/30. Pharmacy consulted to resume heparin drip 8h post-op.   Heparin level and aPTT remain uncorrelated and aPTT subtherapeutic at 45 seconds.  CBC low s/p surgery, plts 271.  Goal of Therapy:  Heparin level 0.3-0.7 units/ml aPTT 66-102 seconds Monitor platelets by anticoagulation protocol: Yes   Plan:  Increase heparin gtt to 650 units/hr F/u 8 hour aPTT F/u restart of apixaban  Bertis Ruddy, PharmD Clinical  Pharmacist Please check AMION for all [place] numbers 02/08/2018 8:05 AM

## 2018-02-08 NOTE — Consult Note (Signed)
   [facility] CM Inpatient Consult   02/08/2018  [name] [birth year] [id number]    Patient screened for potential [facility] Care Management services due to unplanned extreme readmission risk score.  Spoke with inpatient RNCM. [name] is s/p rt AKA. Will likely need rehab post hospital discharge. No current identifiable [facility] Care Management needs at this time.    Marthenia Rolling, MSN-Ed, RN,BSN [facility] Liaison [phone]

## 2018-02-09 DIAGNOSIS — I10 Essential (primary) hypertension: Secondary | ICD-10-CM

## 2018-02-09 LAB — CBC
HCT: 25.7 % — ABNORMAL LOW (ref 39.0–52.0)
HEMATOCRIT: 27.8 % — AB (ref 39.0–52.0)
Hemoglobin: 9.5 g/dL — ABNORMAL LOW (ref 13.0–17.0)
Hemoglobin: 8.7 g/dL — ABNORMAL LOW (ref 13.0–17.0)
MCH: 31.5 pg (ref 26.0–34.0)
MCH: 31.5 pg (ref 26.0–34.0)
MCHC: 34.2 g/dL (ref 30.0–36.0)
MCHC: 33.9 g/dL (ref 30.0–36.0)
MCV: 92.1 fL (ref 80.0–100.0)
MCV: 93.1 fL (ref 80.0–100.0)
PLATELETS: 253 10*3/uL (ref 150–400)
Platelets: 235 10*3/uL (ref 150–400)
RBC: 3.02 MIL/uL — ABNORMAL LOW (ref 4.22–5.81)
RBC: 2.76 MIL/uL — ABNORMAL LOW (ref 4.22–5.81)
RDW: 14.1 % (ref 11.5–15.5)
RDW: 14.6 % (ref 11.5–15.5)
WBC: 17.4 10*3/uL — ABNORMAL HIGH (ref 4.0–10.5)
WBC: 16.8 10*3/uL — AB (ref 4.0–10.5)
nRBC: 0 % (ref 0.0–0.2)
nRBC: 0.2 % (ref 0.0–0.2)

## 2018-02-09 LAB — BASIC METABOLIC PANEL
Anion gap: 11 (ref 5–15)
BUN: 24 mg/dL — ABNORMAL HIGH (ref 8–23)
CALCIUM: 8.5 mg/dL — AB (ref 8.9–10.3)
CO2: 23 mmol/L (ref 22–32)
CREATININE: 2.01 mg/dL — AB (ref 0.61–1.24)
Chloride: 101 mmol/L (ref 98–111)
GFR calc Af Amer: 36 mL/min — ABNORMAL LOW (ref >60)
GFR calc non Af Amer: 31 mL/min — ABNORMAL LOW (ref >60)
Glucose, Bld: 196 mg/dL — ABNORMAL HIGH (ref 70–99)
Potassium: 4.2 mmol/L (ref 3.5–5.1)
Sodium: 135 mmol/L (ref 135–145)

## 2018-02-09 LAB — GLUCOSE, CAPILLARY
Glucose-Capillary: 136 mg/dL — ABNORMAL HIGH (ref 70–99)
Glucose-Capillary: 175 mg/dL — ABNORMAL HIGH (ref 70–99)
Glucose-Capillary: 128 mg/dL — ABNORMAL HIGH (ref 70–99)
Glucose-Capillary: 128 mg/dL — ABNORMAL HIGH (ref 70–99)

## 2018-02-09 MED ORDER — HYDROCORTISONE NA SUCCINATE PF 100 MG IJ SOLR
50.0000 mg | Freq: Every day | INTRAMUSCULAR | Status: DC
  Administered 2018-02-10: 50 mg via INTRAVENOUS
  Filled 2018-02-09: qty 2

## 2018-02-09 NOTE — Progress Notes (Signed)
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  A & P  Right lower extremity ischemia secondary to peripheral arterial disease, severe tibial occlusive disease.  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  Hemoglobin significantly lower yesterday afternoon, although patient was asymptomatic.  Transfusing 2 units PRBC, follow-up CBC pending.  Exam unremarkable, no back pain, doubt internal hemorrhage. --Continue to hold heparin, anticoagulation at this time.  PMH PE, DVT, stroke, treated with apixaban as an outpatient, DVT 2013 --Once hemoglobin stable, resume apixaban  Acute kidney injury superimposed on CKD stage III --Appears to be at baseline at this time.  Coronary artery disease --Remains asymptomatic.  Continue medical management with Imdur, Crestor.  Resume Plavix when hemoglobin stable.  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  Continue sliding scale insulin  PMH PE --resume apixaban when able.   Repeat CBC this afternoon, follow-up this morning CBC, when hemoglobin stabilizes, should be a good candidate for  CIR.  DVT prophylaxis: heparin Code Status: Full Family Communication:  Disposition Plan: CIR   Murray Hodgkins, MD  [facility] Direct contact: [phone] --Via amion app OR  --[email or web address]; password [email or web address]  7PM-7AM contact night coverage as above 02/09/2018, 10:27 AM  LOS: 4 days   Consultants:  Vascular surgery  PMR  Procedures:  Right above-knee amputation 12/30  Antimicrobials:  Ceftriaxone   Vancomycin  Interval history/Subjective: Follow-up right above-the-knee amputation, anemia  Slept poorly as blood transfusion took all night, however feeling fine today, sitting up in chair, does have some dizziness, more so than yesterday.  Pain aggravated by movement but well controlled at rest.  Objective: Vitals:  Vitals:   02/09/18 0405 02/09/18 0602  BP: (!) 114/53 (!) 127/59  Pulse: 81 79  Resp:  16  Temp: 98 F (36.7 C) (!) 97.4 F (36.3 C)  SpO2: 99% 99%    Exam:  Constitutional:   . Appears calm and comfortable sitting up in chair Eyes:  . pupils and irises appear normal ENMT:  . grossly normal hearing  Respiratory:  . CTA bilaterally, no w/r/r.  . Respiratory effort normal.  Cardiovascular:  . RRR, no m/r/g . No LLE extremity edema   Abdomen:  . Soft Skin:  . Back appears unremarkable, no bruising. Psychiatric:  . Mental status o Mood, affect appropriate . judgment and insight appear intact    I have personally reviewed the following:   Data: . CBG stable . Hemoglobin was 5.8 last night, repeat pending for today as is a BMP.  Scheduled Meds: . brimonidine  1 drop Both Eyes BID  . cholecalciferol  1,000 Units Oral Daily  . furosemide  20 mg Oral Daily  . [START ON 02/10/2018] hydrocortisone sod succinate (SOLU-CORTEF) inj  50 mg Intravenous Daily  . imatinib  300 mg  Oral Q lunch  . insulin aspart  0-9 Units Subcutaneous TID WC  . isosorbide mononitrate  30 mg Oral Daily  . multivitamin with minerals  1 tablet Oral Daily   . pantoprazole  40 mg Oral Daily  . polyethylene glycol  17 g Oral BID  . predniSONE  3 mg Oral Q breakfast  . pyridostigmine  60 mg Oral Daily  . rosuvastatin  5 mg Oral Daily  . timolol  1 drop Both Eyes BID   Continuous Infusions: . 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

## 2018-02-09 NOTE — Progress Notes (Addendum)
  Progress Note    02/09/2018 7:43 AM 2 Days Post-Op  Subjective:  Says his pain has gotten better.  Still pain when he moves around.  Wants to be up in the chair.  Sat in chair for quite a while yesterday  Afebrile  Vitals:   02/09/18 0405 02/09/18 0602  BP: (!) 114/53 (!) 127/59  Pulse: 81 79  Resp:  16  Temp: 98 F (36.7 C) (!) 97.4 F (36.3 C)  SpO2: 99% 99%    Physical Exam: Incisions:  Clean and dry with staples in tact.      CBC    Component Value Date/Time   WBC 21.3 (H) 02/08/2018 1653   RBC 1.86 (L) 02/08/2018 1653   HGB 5.8 (LL) 02/08/2018 1653   HGB 10.7 (L) 01/04/2018 1216   HGB 12.4 (L) 11/21/2012 1008   HCT 18.2 (L) 02/08/2018 1653   HCT 32.8 (L) 01/04/2018 1216   HCT 35.8 (L) 11/21/2012 1008   PLT 277 02/08/2018 1653   PLT 348 01/04/2018 1216   MCV 97.8 02/08/2018 1653   MCV 95 01/04/2018 1216   MCV 90.7 11/21/2012 1008   MCH 31.2 02/08/2018 1653   MCHC 31.9 02/08/2018 1653   RDW 14.5 02/08/2018 1653   RDW 14.3 01/04/2018 1216   RDW 13.7 11/21/2012 1008   LYMPHSABS 1.5 02/05/2018 1149   LYMPHSABS 1.2 01/04/2018 1216   LYMPHSABS 1.1 11/21/2012 1008   MONOABS 1.4 (H) 02/05/2018 1149   MONOABS 0.6 11/21/2012 1008   EOSABS 0.3 02/05/2018 1149   EOSABS 0.2 01/04/2018 1216   BASOSABS 0.0 02/05/2018 1149   BASOSABS 0.1 01/04/2018 1216   BASOSABS 0.0 11/21/2012 1008    BMET    Component Value Date/Time   NA 137 02/08/2018 0708   NA 140 01/04/2018 1216   NA 141 02/26/2012 0852   K 4.5 02/08/2018 0708   K 3.4 (L) 02/26/2012 0852   CL 103 02/08/2018 0708   CL 105 02/26/2012 0852   CO2 23 02/08/2018 0708   CO2 28 02/26/2012 0852   GLUCOSE 168 (H) 02/08/2018 0708   GLUCOSE 77 02/26/2012 0852   BUN 14 02/08/2018 0708   BUN 11 01/04/2018 1216   BUN 13.0 02/26/2012 0852   CREATININE 1.72 (H) 02/08/2018 0708   CREATININE 1.33 05/11/2013 1246   CREATININE 1.3 02/26/2012 0852   CALCIUM 8.2 (L) 02/08/2018 0708   CALCIUM 9.1 02/26/2012 0852    GFRNONAA 37 (L) 02/08/2018 0708   GFRAA 43 (L) 02/08/2018 0708    INR    Component Value Date/Time   INR 1.14 02/07/2018 0725     Intake/Output Summary (Last 24 hours) at 02/09/2018 0743 Last data filed at 02/09/2018 0715 Gross per 24 hour  Intake 1650.65 ml  Output 325 ml  Net 1325.65 ml     Assessment/Plan:  78 y.o. male is s/p right above knee amputation  2 Days Post-Op  -pt doing well with pain control -incision is healing nicely with staples in tact.  -he will f/u in 4 weeks with Dr. Scot Dock for staple removal. -PT recommending CIR -stump sock ordered yesterday from [facility]  -okay to restart Eliquis from vascular standpoint   Leontine Locket, PA-C [facility] [phone] 02/09/2018 7:43 AM    I have interviewed and examined patient with PA and agree with assessment and plan above.   Chloeann Alfred C. Donzetta Matters, MD [facility] [place] Office: [phone] Pager: [phone]

## 2018-02-09 NOTE — Anesthesia Postprocedure Evaluation (Signed)
Anesthesia Post Note  Patient: [name]  Procedure(s) Performed: right leg AMPUTATION ABOVE KNEE (Right Leg Upper)     Patient location during evaluation: PACU Anesthesia Type: General Level of consciousness: awake and alert Pain management: pain level controlled Vital Signs Assessment: post-procedure vital signs reviewed and stable Respiratory status: spontaneous breathing, nonlabored ventilation and respiratory function stable Cardiovascular status: blood pressure returned to baseline and stable Postop Assessment: no apparent nausea or vomiting Anesthetic complications: no    Last Vitals:  Vitals:   02/09/18 0405 02/09/18 0602  BP: (!) 114/53 (!) 127/59  Pulse: 81 79  Resp:  16  Temp: 36.7 C (!) 36.3 C  SpO2: 99% 99%    Last Pain:  Vitals:   02/09/18 0602  TempSrc: Oral  PainSc:    Pain Goal: Patients Stated Pain Goal: 0 (02/07/18 1930)               Lynda Rainwater

## 2018-02-10 ENCOUNTER — Inpatient Hospital Stay (HOSPITAL_COMMUNITY)
Admission: RE | Admit: 2018-02-10 | Discharge: 2018-02-19 | DRG: 560 | Disposition: A | Discharge status: Home Health | Payer: Medicare Other | Source: Intra-hospital | Attending: Physical Medicine & Rehabilitation | Admitting: Physical Medicine & Rehabilitation

## 2018-02-10 ENCOUNTER — Institutional Professional Consult (permissible substitution): Payer: Medicare Other | Admitting: Neurology

## 2018-02-10 ENCOUNTER — Encounter

## 2018-02-10 ENCOUNTER — Encounter (HOSPITAL_COMMUNITY): Payer: Self-pay | Admitting: *Deleted

## 2018-02-10 ENCOUNTER — Telehealth: Payer: Self-pay | Admitting: Vascular Surgery

## 2018-02-10 DIAGNOSIS — E1143 Type 2 diabetes mellitus with diabetic autonomic (poly)neuropathy: Secondary | ICD-10-CM | POA: Diagnosis present

## 2018-02-10 DIAGNOSIS — G7000 Myasthenia gravis without (acute) exacerbation: Secondary | ICD-10-CM | POA: Diagnosis present

## 2018-02-10 DIAGNOSIS — N189 Chronic kidney disease, unspecified: Secondary | ICD-10-CM | POA: Diagnosis not present

## 2018-02-10 DIAGNOSIS — Z86711 Personal history of pulmonary embolism: Secondary | ICD-10-CM

## 2018-02-10 DIAGNOSIS — Z8673 Personal history of transient ischemic attack (TIA), and cerebral infarction without residual deficits: Secondary | ICD-10-CM

## 2018-02-10 DIAGNOSIS — Z86718 Personal history of other venous thrombosis and embolism: Secondary | ICD-10-CM | POA: Diagnosis not present

## 2018-02-10 DIAGNOSIS — Z7952 Long term (current) use of systemic steroids: Secondary | ICD-10-CM

## 2018-02-10 DIAGNOSIS — S78119A Complete traumatic amputation at level between unspecified hip and knee, initial encounter: Secondary | ICD-10-CM | POA: Diagnosis not present

## 2018-02-10 DIAGNOSIS — I739 Peripheral vascular disease, unspecified: Secondary | ICD-10-CM

## 2018-02-10 DIAGNOSIS — K219 Gastro-esophageal reflux disease without esophagitis: Secondary | ICD-10-CM | POA: Diagnosis present

## 2018-02-10 DIAGNOSIS — Z7902 Long term (current) use of antithrombotics/antiplatelets: Secondary | ICD-10-CM

## 2018-02-10 DIAGNOSIS — C9211 Chronic myeloid leukemia, BCR/ABL-positive, in remission: Secondary | ICD-10-CM | POA: Diagnosis present

## 2018-02-10 DIAGNOSIS — Z89611 Acquired absence of right leg above knee: Secondary | ICD-10-CM | POA: Diagnosis not present

## 2018-02-10 DIAGNOSIS — Z6824 Body mass index (BMI) 24.0-24.9, adult: Secondary | ICD-10-CM

## 2018-02-10 DIAGNOSIS — N183 Chronic kidney disease, stage 3 unspecified: Secondary | ICD-10-CM

## 2018-02-10 DIAGNOSIS — N289 Disorder of kidney and ureter, unspecified: Secondary | ICD-10-CM | POA: Diagnosis not present

## 2018-02-10 DIAGNOSIS — G4700 Insomnia, unspecified: Secondary | ICD-10-CM | POA: Diagnosis present

## 2018-02-10 DIAGNOSIS — K5730 Diverticulosis of large intestine without perforation or abscess without bleeding: Secondary | ICD-10-CM | POA: Diagnosis present

## 2018-02-10 DIAGNOSIS — I10 Essential (primary) hypertension: Secondary | ICD-10-CM | POA: Diagnosis not present

## 2018-02-10 DIAGNOSIS — G8929 Other chronic pain: Secondary | ICD-10-CM | POA: Diagnosis present

## 2018-02-10 DIAGNOSIS — D62 Acute posthemorrhagic anemia: Secondary | ICD-10-CM | POA: Diagnosis present

## 2018-02-10 DIAGNOSIS — Z9841 Cataract extraction status, right eye: Secondary | ICD-10-CM

## 2018-02-10 DIAGNOSIS — Z8719 Personal history of other diseases of the digestive system: Secondary | ICD-10-CM | POA: Diagnosis not present

## 2018-02-10 DIAGNOSIS — E119 Type 2 diabetes mellitus without complications: Secondary | ICD-10-CM | POA: Diagnosis present

## 2018-02-10 DIAGNOSIS — Z4781 Encounter for orthopedic aftercare following surgical amputation: Secondary | ICD-10-CM | POA: Diagnosis not present

## 2018-02-10 DIAGNOSIS — F40240 Claustrophobia: Secondary | ICD-10-CM | POA: Diagnosis present

## 2018-02-10 DIAGNOSIS — E876 Hypokalemia: Secondary | ICD-10-CM | POA: Diagnosis not present

## 2018-02-10 DIAGNOSIS — M1990 Unspecified osteoarthritis, unspecified site: Secondary | ICD-10-CM | POA: Diagnosis present

## 2018-02-10 DIAGNOSIS — Z9842 Cataract extraction status, left eye: Secondary | ICD-10-CM

## 2018-02-10 DIAGNOSIS — D72829 Elevated white blood cell count, unspecified: Secondary | ICD-10-CM | POA: Diagnosis present

## 2018-02-10 DIAGNOSIS — Z87442 Personal history of urinary calculi: Secondary | ICD-10-CM | POA: Diagnosis not present

## 2018-02-10 DIAGNOSIS — Z8249 Family history of ischemic heart disease and other diseases of the circulatory system: Secondary | ICD-10-CM

## 2018-02-10 DIAGNOSIS — I2510 Atherosclerotic heart disease of native coronary artery without angina pectoris: Secondary | ICD-10-CM | POA: Diagnosis present

## 2018-02-10 DIAGNOSIS — Z823 Family history of stroke: Secondary | ICD-10-CM

## 2018-02-10 DIAGNOSIS — H409 Unspecified glaucoma: Secondary | ICD-10-CM | POA: Diagnosis present

## 2018-02-10 DIAGNOSIS — Z803 Family history of malignant neoplasm of breast: Secondary | ICD-10-CM

## 2018-02-10 DIAGNOSIS — R350 Frequency of micturition: Secondary | ICD-10-CM | POA: Diagnosis not present

## 2018-02-10 DIAGNOSIS — E1151 Type 2 diabetes mellitus with diabetic peripheral angiopathy without gangrene: Secondary | ICD-10-CM | POA: Diagnosis present

## 2018-02-10 DIAGNOSIS — M549 Dorsalgia, unspecified: Secondary | ICD-10-CM | POA: Diagnosis present

## 2018-02-10 DIAGNOSIS — Z882 Allergy status to sulfonamides status: Secondary | ICD-10-CM

## 2018-02-10 DIAGNOSIS — I129 Hypertensive chronic kidney disease with stage 1 through stage 4 chronic kidney disease, or unspecified chronic kidney disease: Secondary | ICD-10-CM | POA: Diagnosis present

## 2018-02-10 DIAGNOSIS — G546 Phantom limb syndrome with pain: Secondary | ICD-10-CM | POA: Diagnosis not present

## 2018-02-10 DIAGNOSIS — M810 Age-related osteoporosis without current pathological fracture: Secondary | ICD-10-CM | POA: Diagnosis present

## 2018-02-10 DIAGNOSIS — Z7901 Long term (current) use of anticoagulants: Secondary | ICD-10-CM

## 2018-02-10 DIAGNOSIS — E46 Unspecified protein-calorie malnutrition: Secondary | ICD-10-CM | POA: Diagnosis present

## 2018-02-10 DIAGNOSIS — Z832 Family history of diseases of the blood and blood-forming organs and certain disorders involving the immune mechanism: Secondary | ICD-10-CM

## 2018-02-10 DIAGNOSIS — E782 Mixed hyperlipidemia: Secondary | ICD-10-CM | POA: Diagnosis present

## 2018-02-10 DIAGNOSIS — Z888 Allergy status to other drugs, medicaments and biological substances status: Secondary | ICD-10-CM

## 2018-02-10 DIAGNOSIS — E1122 Type 2 diabetes mellitus with diabetic chronic kidney disease: Secondary | ICD-10-CM | POA: Diagnosis present

## 2018-02-10 LAB — GLUCOSE, CAPILLARY
Glucose-Capillary: 97 mg/dL (ref 70–99)
Glucose-Capillary: 116 mg/dL — ABNORMAL HIGH (ref 70–99)
Glucose-Capillary: 126 mg/dL — ABNORMAL HIGH (ref 70–99)
Glucose-Capillary: 109 mg/dL — ABNORMAL HIGH (ref 70–99)

## 2018-02-10 LAB — CBC
HCT: 25.1 % — ABNORMAL LOW (ref 39.0–52.0)
Hemoglobin: 8.4 g/dL — ABNORMAL LOW (ref 13.0–17.0)
MCH: 30.4 pg (ref 26.0–34.0)
MCHC: 33.5 g/dL (ref 30.0–36.0)
MCV: 90.9 fL (ref 80.0–100.0)
Platelets: 216 10*3/uL (ref 150–400)
RBC: 2.76 MIL/uL — AB (ref 4.22–5.81)
RDW: 14.6 % (ref 11.5–15.5)
WBC: 13.1 10*3/uL — ABNORMAL HIGH (ref 4.0–10.5)
nRBC: 0.2 % (ref 0.0–0.2)

## 2018-02-10 LAB — BASIC METABOLIC PANEL
Anion gap: 11 (ref 5–15)
BUN: 18 mg/dL (ref 8–23)
CO2: 25 mmol/L (ref 22–32)
Calcium: 8.8 mg/dL — ABNORMAL LOW (ref 8.9–10.3)
Chloride: 103 mmol/L (ref 98–111)
Creatinine, Ser: 1.87 mg/dL — ABNORMAL HIGH (ref 0.61–1.24)
GFR calc non Af Amer: 33 mL/min — ABNORMAL LOW (ref >60)
GFR, EST AFRICAN AMERICAN: 39 mL/min — AB (ref >60)
Glucose, Bld: 115 mg/dL — ABNORMAL HIGH (ref 70–99)
Potassium: 3.8 mmol/L (ref 3.5–5.1)
SODIUM: 139 mmol/L (ref 135–145)

## 2018-02-10 MED ORDER — FUROSEMIDE 20 MG PO TABS
20.0000 mg | ORAL_TABLET | Freq: Every day | ORAL | Status: DC
  Administered 2018-02-11 – 2018-02-19 (×9): 20 mg via ORAL
  Filled 2018-02-10 (×9): qty 1

## 2018-02-10 MED ORDER — GUAIFENESIN-DM 100-10 MG/5ML PO SYRP: 15.0000 mL | ORAL_SOLUTION | ORAL | Status: DC | PRN

## 2018-02-10 MED ORDER — PANTOPRAZOLE SODIUM 40 MG PO TBEC
40.0000 mg | DELAYED_RELEASE_TABLET | Freq: Every day | ORAL | Status: DC
  Administered 2018-02-11 – 2018-02-19 (×9): 40 mg via ORAL
  Filled 2018-02-10 (×9): qty 1

## 2018-02-10 MED ORDER — ALUM & MAG HYDROXIDE-SIMETH 200-200-20 MG/5ML PO SUSP: 30.0000 mL | ORAL | Status: DC | PRN

## 2018-02-10 MED ORDER — FLEET ENEMA 7-19 GM/118ML RE ENEM: 1.0000 | ENEMA | Freq: Once | RECTAL | Status: DC | PRN

## 2018-02-10 MED ORDER — LIDOCAINE HCL URETHRAL/MUCOSAL 2 % EX GEL
CUTANEOUS | Status: DC | PRN
  Filled 2018-02-10: qty 5

## 2018-02-10 MED ORDER — ISOSORBIDE MONONITRATE ER 30 MG PO TB24
30.0000 mg | ORAL_TABLET | Freq: Every day | ORAL | Status: DC
  Administered 2018-02-11 – 2018-02-19 (×9): 30 mg via ORAL
  Filled 2018-02-10 (×9): qty 1

## 2018-02-10 MED ORDER — ROSUVASTATIN CALCIUM 5 MG PO TABS
5.0000 mg | ORAL_TABLET | Freq: Every day | ORAL | Status: DC
  Administered 2018-02-11 – 2018-02-19 (×9): 5 mg via ORAL
  Filled 2018-02-10 (×9): qty 1

## 2018-02-10 MED ORDER — ADULT MULTIVITAMIN W/MINERALS CH
1.0000 | ORAL_TABLET | Freq: Every day | ORAL | Status: DC
  Administered 2018-02-11 – 2018-02-19 (×9): 1 via ORAL
  Filled 2018-02-10 (×9): qty 1

## 2018-02-10 MED ORDER — TIMOLOL MALEATE 0.5 % OP SOLN
1.0000 [drp] | Freq: Two times a day (BID) | OPHTHALMIC | Status: DC
  Administered 2018-02-10 – 2018-02-19 (×18): 1 [drp] via OPHTHALMIC
  Filled 2018-02-10: qty 5

## 2018-02-10 MED ORDER — TRAZODONE HCL 50 MG PO TABS
25.0000 mg | ORAL_TABLET | Freq: Every day | ORAL | Status: DC
  Administered 2018-02-10 – 2018-02-18 (×9): 50 mg via ORAL
  Filled 2018-02-10 (×9): qty 1

## 2018-02-10 MED ORDER — CLOPIDOGREL BISULFATE 75 MG PO TABS
75.0000 mg | ORAL_TABLET | Freq: Every day | ORAL | Status: DC
  Administered 2018-02-11 – 2018-02-19 (×9): 75 mg via ORAL
  Filled 2018-02-10 (×9): qty 1

## 2018-02-10 MED ORDER — PHENOL 1.4 % MT LIQD: 1.0000 | OROMUCOSAL | Status: DC | PRN

## 2018-02-10 MED ORDER — APIXABAN 5 MG PO TABS
5.0000 mg | ORAL_TABLET | Freq: Two times a day (BID) | ORAL | Status: DC
  Administered 2018-02-10: 5 mg via ORAL
  Filled 2018-02-10: qty 1

## 2018-02-10 MED ORDER — TRAZODONE HCL 50 MG PO TABS: 50.0000 mg | ORAL_TABLET | Freq: Every day | ORAL | Status: DC

## 2018-02-10 MED ORDER — ACETAMINOPHEN 325 MG PO TABS: 325.0000 mg | ORAL_TABLET | ORAL | Status: DC | PRN

## 2018-02-10 MED ORDER — PROCHLORPERAZINE EDISYLATE 10 MG/2ML IJ SOLN: 5.0000 mg | Freq: Four times a day (QID) | INTRAMUSCULAR | Status: DC | PRN

## 2018-02-10 MED ORDER — BISACODYL 10 MG RE SUPP: 10.0000 mg | Freq: Every day | RECTAL | Status: DC | PRN

## 2018-02-10 MED ORDER — POLYETHYLENE GLYCOL 3350 17 G PO PACK
17.0000 g | PACK | Freq: Two times a day (BID) | ORAL | Status: DC
  Administered 2018-02-13 – 2018-02-15 (×4): 17 g via ORAL
  Filled 2018-02-10 (×16): qty 1

## 2018-02-10 MED ORDER — PROCHLORPERAZINE MALEATE 5 MG PO TABS: 5.0000 mg | ORAL_TABLET | Freq: Four times a day (QID) | ORAL | Status: DC | PRN

## 2018-02-10 MED ORDER — SENNOSIDES-DOCUSATE SODIUM 8.6-50 MG PO TABS: 1.0000 | ORAL_TABLET | Freq: Every evening | ORAL | Status: DC | PRN

## 2018-02-10 MED ORDER — BRIMONIDINE TARTRATE 0.2 % OP SOLN
1.0000 [drp] | Freq: Two times a day (BID) | OPHTHALMIC | Status: DC
  Administered 2018-02-10 – 2018-02-19 (×18): 1 [drp] via OPHTHALMIC
  Filled 2018-02-10: qty 5

## 2018-02-10 MED ORDER — PREDNISONE 1 MG PO TABS
3.0000 mg | ORAL_TABLET | Freq: Every day | ORAL | Status: DC
  Administered 2018-02-11 – 2018-02-19 (×9): 3 mg via ORAL
  Filled 2018-02-10 (×9): qty 3

## 2018-02-10 MED ORDER — IMATINIB MESYLATE 100 MG PO TABS
300.0000 mg | ORAL_TABLET | Freq: Every day | ORAL | Status: DC
  Administered 2018-02-11 – 2018-02-17 (×7): 300 mg via ORAL
  Filled 2018-02-10 (×11): qty 3

## 2018-02-10 MED ORDER — CLOPIDOGREL BISULFATE 75 MG PO TABS: 75.0000 mg | ORAL_TABLET | Freq: Every day | ORAL | Status: DC

## 2018-02-10 MED ORDER — DIPHENHYDRAMINE HCL 12.5 MG/5ML PO ELIX: 12.5000 mg | ORAL_SOLUTION | Freq: Four times a day (QID) | ORAL | Status: DC | PRN

## 2018-02-10 MED ORDER — PYRIDOSTIGMINE BROMIDE 60 MG PO TABS
60.0000 mg | ORAL_TABLET | Freq: Every day | ORAL | Status: DC
  Administered 2018-02-11 – 2018-02-19 (×9): 60 mg via ORAL
  Filled 2018-02-10 (×9): qty 1

## 2018-02-10 MED ORDER — INSULIN ASPART 100 UNIT/ML ~~LOC~~ SOLN
0.0000 [IU] | Freq: Three times a day (TID) | SUBCUTANEOUS | Status: DC
  Administered 2018-02-10 – 2018-02-12 (×3): 1 [IU] via SUBCUTANEOUS
  Administered 2018-02-13: 2 [IU] via SUBCUTANEOUS
  Administered 2018-02-13 – 2018-02-18 (×5): 1 [IU] via SUBCUTANEOUS

## 2018-02-10 MED ORDER — PROCHLORPERAZINE 25 MG RE SUPP: 12.5000 mg | Freq: Four times a day (QID) | RECTAL | Status: DC | PRN

## 2018-02-10 MED ORDER — GUAIFENESIN-DM 100-10 MG/5ML PO SYRP: 5.0000 mL | ORAL_SOLUTION | Freq: Four times a day (QID) | ORAL | Status: DC | PRN

## 2018-02-10 MED ORDER — VITAMIN D 25 MCG (1000 UNIT) PO TABS
1000.0000 [IU] | ORAL_TABLET | Freq: Every day | ORAL | Status: DC
  Administered 2018-02-11 – 2018-02-19 (×9): 1000 [IU] via ORAL
  Filled 2018-02-10 (×9): qty 1

## 2018-02-10 MED ORDER — OXYCODONE-ACETAMINOPHEN 5-325 MG PO TABS
1.0000 | ORAL_TABLET | ORAL | Status: DC | PRN
  Administered 2018-02-11 – 2018-02-12 (×3): 2 via ORAL
  Administered 2018-02-13: 1 via ORAL
  Administered 2018-02-14: 2 via ORAL
  Administered 2018-02-14 – 2018-02-16 (×3): 1 via ORAL
  Administered 2018-02-16 – 2018-02-17 (×2): 2 via ORAL
  Administered 2018-02-18 (×2): 1 via ORAL
  Administered 2018-02-19: 2 via ORAL
  Filled 2018-02-10: qty 2
  Filled 2018-02-10: qty 1
  Filled 2018-02-10 (×2): qty 2
  Filled 2018-02-10 (×2): qty 1
  Filled 2018-02-10 (×5): qty 2
  Filled 2018-02-10: qty 1
  Filled 2018-02-10: qty 2
  Filled 2018-02-10: qty 1
  Filled 2018-02-10: qty 2
  Filled 2018-02-10: qty 1

## 2018-02-10 MED ORDER — POLYETHYLENE GLYCOL 3350 17 G PO PACK: 17.0000 g | PACK | Freq: Every day | ORAL | Status: DC | PRN

## 2018-02-10 MED ORDER — APIXABAN 5 MG PO TABS
5.0000 mg | ORAL_TABLET | Freq: Two times a day (BID) | ORAL | Status: DC
  Administered 2018-02-10 – 2018-02-19 (×18): 5 mg via ORAL
  Filled 2018-02-10 (×18): qty 1

## 2018-02-10 NOTE — Progress Notes (Signed)
Order received to discharge patient to CIR.  Report called to receiving nurse.  Will transfer patient via wheelchair to 4W room 8.

## 2018-02-10 NOTE — Progress Notes (Signed)
IP rehab admissions - I met with patient and his wife at the bedside.  They are familiar with CIR and want to return.  Patient has been cleared by attending MD and will admit to inpatient rehab today.  Call me for questions.  [phone]

## 2018-02-10 NOTE — H&P (Signed)
Physical Medicine and Rehabilitation Admission H&P        Chief Complaint  Patient presents with  . 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 He underwent R-AKA by Dr. Scot Dock on 02/07/18. Post surgery, pain slowly improving and stump sock ordered by [employer]. Therapy ongoing and CIR recommended due to functional deficits.      Review of Systems  Constitutional: Negative for chills and fever.  HENT: Negative for hearing loss and tinnitus.   Eyes: Negative for blurred vision and double vision.  Respiratory: Negative for cough and shortness of breath.   Cardiovascular: Negative for chest pain and palpitations.  Gastrointestinal: Negative for heartburn and nausea.  Genitourinary: Positive for frequency and urgency. Negative for dysuria.  Musculoskeletal: Negative for back pain, joint pain and myalgias.  Skin: Negative for rash.  Neurological: Positive for dizziness, sensory change and weakness. Negative for headaches.  Psychiatric/Behavioral: The patient has insomnia. The patient is not nervous/anxious.             Past Medical History:  Diagnosis Date  . Bruises easily      d/t being on Eliquis  . Carotid artery disease ([place])      a. Duplex 05/2014: 18-29% RICA, 9-37% LICA, elevated velocities in right subclavian artery, normal left subclavian artery..  . Chronic back pain      HNP  . Chronic kidney disease (CKD)    . Claustrophobia      takes Ativan as needed  . 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 Coronary atherosclerosis of unspecified type of vessel, native or graft      a. cath in 2003 showed 10-20% LM, scattered 20% prox-mid LAD, 30% more distal LAD, 50-60% stenosis of LAD towards apex, 20% Cx, 30% dRCA, focal 95%  stenosis in smaller of 2 branches of PDA, EF 60-65%.  . Diabetes mellitus ([place])    . Diarrhea      takes Imodium daily as needed  . Diverticulosis    . Diverticulosis of colon (without mention of hemorrhage)    . DJD (degenerative joint disease)    . DVT (deep venous thrombosis) ([place]) 07/2011  . Esophageal stricture    . Essential hypertension      takes Imdur and Lisinopril daily  . Gastric polyps      benign  . GERD (gastroesophageal reflux disease)      takes Nexium daily  . Glaucoma      uses eye drops  . History of bronchitis      not sure when the last time  . History of colon polyps      benign  . History of kidney stones    . History of kidney stones    . Insomnia      takes Melatonin nightly as needed  . Mixed hyperlipidemia      takes Crestor daily  . Myasthenia gravis ([place])      uses prednisone  . Osteoporosis 2016  . Peripheral edema      takes Lasix daily  . Pituitary tumor      checks Dr[facility]Walden at [facility] checks it every Jan   . Pneumonia 01/2016  . PONV (postoperative nausea  and vomiting)    . Ptosis    . Pulmonary embolus ([place]) 2012  . Stroke [facility]) 06/2015           Past Surgical History:  Procedure Laterality Date  . ABDOMINAL AORTOGRAM W/LOWER EXTREMITY N/A 11/18/2016    Procedure: ABDOMINAL AORTOGRAM W/LOWER EXTREMITY;  Surgeon: [name], MD;  Location: [place] CV LAB;  Service: Cardiovascular;  Laterality: N/A;  . AMPUTATION Right 02/07/2018    Procedure: right leg AMPUTATION ABOVE KNEE;  Surgeon: [name], MD;  Location: [place];  Service: Vascular;  Laterality: Right;  . AORTOGRAM Right 12/23/2017    Procedure: AORTOGRAM, RIGHT LOWER EXTREMITY ANGIOGRAM, RIGHT SUPERFICIAL FEMORAL ARTERY ANGIOPLASTY WITH STENT, RIGHT POPLITEAL ARTERY ANGIOPLASTY WITH STENT, LEFT GROIN CUTDOWN AND LEFT FEMORAL ARTERY REPAIR;  Surgeon: [name], MD;  Location: [place];  Service: Vascular;  Laterality: Right;  . BACK SURGERY         x2, lumbar and cervical  . CARDIAC CATHETERIZATION      . CATARACT EXTRACTION Bilateral 12/12  . COLONOSCOPY      . ESOPHAGOGASTRODUODENOSCOPY ENDOSCOPY   04/2015  . IR GENERIC HISTORICAL   03/03/2016    IR IVC FILTER PLMT / S&I [name] GUID/MOD SED 03/03/2016 [name], MD [facility]INTER[facility] RAD  . IR GENERIC HISTORICAL   04/09/2016    IR RADIOLOGIST EVAL & MGMT 04/09/2016 [name], MD [facility] INTER[facility] RAD  . IR GENERIC HISTORICAL   04/30/2016    IR IVC FILTER RETRIEVAL / S&I [name] GUID/MOD SED 04/30/2016 [name], MD [facility]-INTERV RAD  . LITHOTRIPSY      . LUMBAR LAMINECTOMY/DECOMPRESSION MICRODISCECTOMY Right 09/14/2012    Procedure: Right Lumbar three-four, four-five, Lumbar five-Sacral one decompressive laminectomy;  Surgeon: [name], MD;  Location: [place] [facility]UR[facility];  Service: Neurosurgery;  Laterality: Right;  . LUMBAR LAMINECTOMY/DECOMPRESSION MICRODISCECTOMY Left 03/11/2016    Procedure: Left Lumbar Two-ThreeMicrodiscectomy;  Surgeon: [name], MD;  Location: [place];  Service: Neurosurgery;  Laterality: Left;  . PERIPHERAL VASCULAR INTERVENTION   11/18/2016    Procedure: PERIPHERAL VASCULAR INTERVENTION;  Surgeon: [name], MD;  Location: [place] CV LAB;  Service: Cardiovascular;;  Left popliteal  . SHOULDER SURGERY Left 05/07/2009  . TONSILLECTOMY               Family History  Problem Relation Age of Onset  . Heart disease Mother    . Heart attack Mother    . Stroke Mother    . Heart disease Father    . Heart attack Father    . Stroke Father    . Breast cancer Sister          Twin   . Heart attack Sister    . Dementia Sister    . Heart disease Sister    . Heart attack Sister    . Clotting disorder Sister    . Heart attack Sister    . Colon cancer Neg Hx        Social History:  reports that he has never smoked. He has never used smokeless tobacco. He reports that he does not drink alcohol or use drugs.           Allergies  Allergen Reactions  . Other Other  (See Comments)      Regarding scans, the PATIENT IS CLAUSTROPHOBIC!!!  . Phenergan [Promethazine Hcl] Swelling and Other (See Comments)      Cannot be combined with Zofran because swelling of  the eyes resulted and patient became very restless-  . Zofran [Ondansetron Hcl] Swelling and Other (See Comments)      Cannot be combined with Phenergan because swelling of the eyes resulted and patient became very restless-  . Sulfamethoxazole Rash and Itching            Medications Prior to Admission  Medication Sig Dispense Refill  . brimonidine-timolol (COMBIGAN) 0.2-0.5 % ophthalmic solution Place 1 drop into both eyes 2 (two) times daily.       . cholecalciferol (VITAMIN D) 1000 units tablet Take 1,000 Units by mouth daily.       . clopidogrel (PLAVIX) 75 MG tablet Take 1 tablet (75 mg total) by mouth daily. 30 tablet 6  . ELIQUIS 5 MG TABS tablet TAKE 1 TABLET (5 MG TOTAL) BY MOUTH 2 (TWO) TIMES DAILY. 60 tablet 3  . furosemide (LASIX) 40 MG tablet Take 1 tablet (40 mg total) by mouth daily. 90 tablet 3  . imatinib (GLEEVEC) 100 MG tablet Take 300 mg by mouth daily with lunch. Take with noon meal and a large glass of water.Caution:Chemotherapy      . isosorbide mononitrate (IMDUR) 30 MG 24 hr tablet Take 1 tablet (30 mg total) by mouth daily. 90 tablet 3  . lidocaine (LIDODERM) 5 % Place 1 patch onto the skin daily as needed (for back pain). Remove & Discard patch within 12 hours or as directed by MD       . lisinopril (PRINIVIL,ZESTRIL) 10 MG tablet Take 1 tablet (10 mg total) by mouth daily. 30 tablet 11  . loperamide (IMODIUM) 2 MG capsule Take 2-4 mg by mouth 3 (three) times daily as needed for diarrhea or loose stools.       . Multiple Vitamin (MULTIVITAMIN) tablet Take 1 tablet by mouth daily.      Marland Kitchen oxyCODONE (OXY IR/ROXICODONE) 5 MG immediate release tablet Take 1 tablet (5 mg total) by mouth every 6 (six) hours as needed for breakthrough pain. 25 tablet 0  . potassium chloride (K-DUR) 10  MEQ tablet Take 1 tablet (10 mEq total) by mouth daily. 90 tablet 3  . predniSONE (DELTASONE) 1 MG tablet TAKE 3 TABLETS BY MOUTH ONCE DAILY WITH BREAKFAST (Patient taking differently: Take 3 mg by mouth daily with breakfast. ) 270 tablet 0  . pyridostigmine (MESTINON) 60 MG tablet Take 60 mg by mouth daily.      . rosuvastatin (CRESTOR) 10 MG tablet Take 5 mg by mouth daily.      . traMADol (ULTRAM) 50 MG tablet Take 1 tablet (50 mg total) by mouth every 6 (six) hours as needed for moderate pain or severe pain. 15 tablet 0  . oxyCODONE-acetaminophen (PERCOCET/ROXICET) 5-325 MG tablet Take 1 tablet by mouth every 6 (six) hours as needed (for pain).    0      Drug Regimen Review  Drug regimen was reviewed and remains appropriate with no significant issues identified   Home: Home Living Family/patient expects to be discharged to:: Private residence Living Arrangements: Spouse/significant other Available Help at Discharge: Family, Available 24 hours/day Type of Home: House Home Access: Stairs to enter [employer] of Steps: 3 from porch, then 9 to first floor  Entrance Stairs-Rails: Left Home Layout: Multi-level Alternate Level Stairs-Number of Steps: 9 Alternate Level Stairs-Rails: Left Bathroom Shower/Tub: Multimedia programmer: Handicapped height Home Equipment: [place] in, Environmental consultant - 2 wheels, [place] - single point Additional Comments: Walk-in shower with built in  bench   Functional History: Prior Function Level of Independence: Needs assistance Gait / Transfers Assistance Needed: pt used RW ADL's / Homemaking Assistance Needed:  assistance for LB dressing  Comments: Enjoys spending time with family   Functional Status:  Mobility: Bed Mobility Overal bed mobility: Needs Assistance Bed Mobility: Sit to Supine Supine to sit: Min assist Sit to supine: Min assist General bed mobility comments: Assist to bring LLE up into bed Transfers Overall  transfer level: Needs assistance Equipment used: Ambulation equipment used Transfers: Sit to/from Stand Sit to Stand: +2 physical assistance, Mod assist Stand pivot transfers: Mod assist, +2 physical assistance  Lateral/Scoot Transfers: Min guard General transfer comment: Assist to bring hips up and for balance. Stood x 2 for ~20 sec. Used Stedy to go from recliner to bed   ADL: ADL Overall ADL's : Needs assistance/impaired Eating/Feeding: Independent, Sitting Grooming: Set up, Sitting, Oral care Upper Body Bathing: Min guard, Sitting Lower Body Bathing: Moderate assistance, Sitting/lateral leans Upper Body Dressing : Sitting Lower Body Dressing: Maximal assistance, Sitting/lateral leans Toilet Transfer: Moderate assistance, +2 for physical assistance, Cueing for safety, RW, Stand-pivot, BSC Toileting- Clothing Manipulation and Hygiene: Moderate assistance, Sitting/lateral lean Functional mobility during ADLs: Rolling walker, Moderate assistance, +2 for safety/equipment General ADL Comments: educated in LB ADL and pericare leaning side to side in sitting   Cognition: Cognition Overall Cognitive Status: Within Functional Limits for tasks assessed Orientation Level: Oriented X4 Cognition Arousal/Alertness: Awake/alert Behavior During Therapy: WFL for tasks assessed/performed Overall Cognitive Status: Within Functional Limits for tasks assessed     Blood pressure (!) 142/64, pulse 92, temperature 97.8 F (36.6 C), temperature source Oral, resp. rate 18, height 5' 6.93" (1.7 m), weight 74.5 kg, SpO2 98 %. Physical Exam  Nursing note and vitals reviewed. Constitutional: He is oriented to person, place, and time. He appears well-developed and well-nourished.  HENT:  Head: Normocephalic.  Eyes: Pupils are equal, round, and reactive to light.  Neck: Normal range of motion.  Cardiovascular: Normal rate. Exam reveals no friction rub.  No murmur heard. Respiratory: Effort normal. No  respiratory distress. He has no wheezes.  GI: Soft. He exhibits no distension. There is no abdominal tenderness.  Musculoskeletal:        General: Edema present.     Comments: Min edema R-AKA. Incision intact with staples in place and min-moderate serous drainage on abd pad. Left leg non-tender   Neurological: He is alert and oriented to person, place, and time. No cranial nerve deficit.  UE 5/5. RLE: can lift leg against gravity. LLE 4/5 prox to distal.   Skin: Skin is warm. No erythema.  Psychiatric: He has a normal mood and affect. His behavior is normal.      Lab Results Last 48 Hours        Results for orders placed or performed during the hospital encounter of 02/05/18 (from the past 48 hour(s))  Glucose, capillary     Status: Abnormal    Collection Time: 02/08/18  4:08 PM  Result Value Ref Range    Glucose-Capillary 162 (H) 70 - 99 mg/dL    Comment 1 Notify RN    APTT     Status: Abnormal    Collection Time: 02/08/18  4:53 PM  Result Value Ref Range    aPTT >200 (HH) 24 - 36 seconds      Comment:        IF BASELINE aPTT IS ELEVATED, SUGGEST PATIENT RISK ASSESSMENT BE USED TO DETERMINE APPROPRIATE  ANTICOAGULANT THERAPY. REPEATED TO VERIFY CRITICAL RESULT CALLED TO, READ BACK BY AND VERIFIED WITH: Consuela Mimes RN @ 1913 ON 02/08/18 BY TEMOCHE, H Performed at [facility], [place] [address], [place], [name] [address]    Heparin level (unfractionated)     Status: Abnormal    Collection Time: 02/08/18  4:53 PM  Result Value Ref Range    Heparin Unfractionated >2.20 (H) 0.30 - 0.70 IU/mL      Comment: RESULTS CONFIRMED BY MANUAL DILUTION Performed at [facility], [place] [address] [place] [address]    CBC     Status: Abnormal    Collection Time: 02/08/18  4:53 PM  Result Value Ref Range    WBC 21.3 (H) 4.0 - 10.5 K/uL    RBC 1.86 (L) 4.22 - 5.81 MIL/uL    Hemoglobin 5.8 (LL) 13.0 - 17.0 g/dL      Comment: REPEATED TO VERIFY THIS CRITICAL RESULT HAS  VERIFIED AND BEEN CALLED TO ANDREA SMITH HART,RN BY ZELDA BEECH ON 12 31 2019 AT 1825, AND HAS BEEN READ BACK.       HCT 18.2 (L) 39.0 - 52.0 %    MCV 97.8 80.0 - 100.0 fL    MCH 31.2 26.0 - 34.0 pg    MCHC 31.9 30.0 - 36.0 g/dL    RDW 14.5 11.5 - 15.5 %    Platelets 277 150 - 400 K/uL    nRBC 0.0 0.0 - 0.2 %      Comment: Performed at [facility], [place] [address], [place], [place] [address]  Prepare RBC     Status: None    Collection Time: 02/08/18  6:46 PM  Result Value Ref Range    Order Confirmation          ORDER PROCESSED BY BLOOD BANK Performed at [facility], [place] [address], [place], [place] [address]    Glucose, capillary     Status: Abnormal    Collection Time: 02/08/18  9:21 PM  Result Value Ref Range    Glucose-Capillary 168 (H) 70 - 99 mg/dL  Type and screen [place]     Status: None (Preliminary result)    Collection Time: 02/08/18 10:37 PM  Result Value Ref Range    ABO/RH(D) A POS      Antibody Screen NEG      Sample Expiration 02/11/2018      Unit Number [id number]      Blood Component Type RED CELLS,LR      Unit division 00      Status of Unit ALLOCATED      Transfusion Status OK TO TRANSFUSE      Crossmatch Result Compatible      Unit Number [id number]      Blood Component Type RED CELLS,LR      Unit division 00      Status of Unit ISSUED,FINAL      Transfusion Status OK TO TRANSFUSE      Crossmatch Result          Compatible Performed at [facility], [place] [address], [place], [place] [address]      Unit Number [id number]      Blood Component Type RED CELLS,LR      Unit division 00      Status of Unit ISSUED,FINAL      Transfusion Status OK TO TRANSFUSE      Crossmatch Result Compatible  Glucose, capillary     Status: Abnormal    Collection Time: 02/09/18  6:29 AM  Result Value Ref Range    Glucose-Capillary 136 (H) 70 - 99 mg/dL  CBC     Status: Abnormal    Collection Time: 02/09/18  9:02 AM    Result Value Ref Range    WBC 17.4 (H) 4.0 - 10.5 K/uL    RBC 3.02 (L) 4.22 - 5.81 MIL/uL    Hemoglobin 9.5 (L) 13.0 - 17.0 g/dL      Comment: REPEATED TO VERIFY POST TRANSFUSION SPECIMEN      HCT 27.8 (L) 39.0 - 52.0 %    MCV 92.1 80.0 - 100.0 fL    MCH 31.5 26.0 - 34.0 pg    MCHC 34.2 30.0 - 36.0 g/dL    RDW 14.1 11.5 - 15.5 %    Platelets 253 150 - 400 K/uL    nRBC 0.0 0.0 - 0.2 %      Comment: Performed at [facility], [place] [address] [place], [place] [address]  Basic metabolic panel     Status: Abnormal    Collection Time: 02/09/18  9:02 AM  Result Value Ref Range    Sodium 135 135 - 145 mmol/L    Potassium 4.2 3.5 - 5.1 mmol/L    Chloride 101 98 - 111 mmol/L    CO2 23 22 - 32 mmol/L    Glucose, Bld 196 (H) 70 - 99 mg/dL    BUN 24 (H) 8 - 23 mg/dL    Creatinine, Ser 2.01 (H) 0.61 - 1.24 mg/dL    Calcium 8.5 (L) 8.9 - 10.3 mg/dL    GFR calc non Af Amer 31 (L) >60 mL/min    GFR calc Af Amer 36 (L) >60 mL/min    Anion gap 11 5 - 15      Comment: Performed at [place] [address] [place], [name] [address]  Glucose, capillary     Status: Abnormal    Collection Time: 02/09/18 11:33 AM  Result Value Ref Range    Glucose-Capillary 175 (H) 70 - 99 mg/dL    Comment 1 Notify RN      Comment 2 Document in Chart    Glucose, capillary     Status: Abnormal    Collection Time: 02/09/18  3:56 PM  Result Value Ref Range    Glucose-Capillary 128 (H) 70 - 99 mg/dL    Comment 1 Notify RN      Comment 2 Document in Chart    CBC     Status: Abnormal    Collection Time: 02/09/18  6:19 PM  Result Value Ref Range    WBC 16.8 (H) 4.0 - 10.5 K/uL    RBC 2.76 (L) 4.22 - 5.81 MIL/uL    Hemoglobin 8.7 (L) 13.0 - 17.0 g/dL    HCT 25.7 (L) 39.0 - 52.0 %    MCV 93.1 80.0 - 100.0 fL    MCH 31.5 26.0 - 34.0 pg    MCHC 33.9 30.0 - 36.0 g/dL    RDW 14.6 11.5 - 15.5 %    Platelets 235 150 - 400 K/uL    nRBC 0.2 0.0 - 0.2 %      Comment: Performed at [place], [place] [address], [place], [name] [address]  Glucose, capillary     Status: Abnormal    Collection Time: 02/09/18  9:31 PM  Result Value Ref Range  Glucose-Capillary 128 (H) 70 - 99 mg/dL    Comment 1 Notify RN      Comment 2 Document in Chart    CBC     Status: Abnormal    Collection Time: 02/10/18  2:58 AM  Result Value Ref Range    WBC 13.1 (H) 4.0 - 10.5 K/uL    RBC 2.76 (L) 4.22 - 5.81 MIL/uL    Hemoglobin 8.4 (L) 13.0 - 17.0 g/dL    HCT 25.1 (L) 39.0 - 52.0 %    MCV 90.9 80.0 - 100.0 fL    MCH 30.4 26.0 - 34.0 pg    MCHC 33.5 30.0 - 36.0 g/dL    RDW 14.6 11.5 - 15.5 %    Platelets 216 150 - 400 K/uL    nRBC 0.2 0.0 - 0.2 %      Comment: Performed at [facility], [place] [address], [place], [name] [address]  Glucose, capillary     Status: None    Collection Time: 02/10/18  6:14 AM  Result Value Ref Range    Glucose-Capillary 97 70 - 99 mg/dL    Comment 1 Notify RN      Comment 2 Document in Chart    Basic metabolic panel     Status: Abnormal    Collection Time: 02/10/18  8:01 AM  Result Value Ref Range    Sodium 139 135 - 145 mmol/L    Potassium 3.8 3.5 - 5.1 mmol/L    Chloride 103 98 - 111 mmol/L    CO2 25 22 - 32 mmol/L    Glucose, Bld 115 (H) 70 - 99 mg/dL    BUN 18 8 - 23 mg/dL    Creatinine, Ser 1.87 (H) 0.61 - 1.24 mg/dL    Calcium 8.8 (L) 8.9 - 10.3 mg/dL    GFR calc non Af Amer 33 (L) >60 mL/min    GFR calc Af Amer 39 (L) >60 mL/min    Anion gap 11 5 - 15      Comment: Performed at [facility] [name] [place], [place] [address]  Glucose, capillary     Status: Abnormal    Collection Time: 02/10/18 11:24 AM  Result Value Ref Range    Glucose-Capillary 116 (H) 70 - 99 mg/dL    Comment 1 Notify RN      Comment 2 Document in Chart      *Note: Due to a large number of results and/or encounters for the requested time period, some results have not been displayed. A complete set of results can be found in Results Review.      Imaging  Results (Last 48 hours)  No results found.           Medical Problem List and Plan: 1.  Functional deficits secondary to PAD, right AKA.             -admit to inpatient rehab             -prosthetic education 2.  PE/DVT Anticoagulation: Pharmaceutical: Other (comment) Eliquis resumed today.  3. Pain Management: Oxycodone prn effective.  4. Mood: LCSW to follow for evaluation and support 5. Neuropsych: This patient is capable of making decisions on his own behalf. 6. Skin/Wound Care: Monitor wound for healing, continue stump sock, convert to shrinker             -Continue protein supplement.  7. Fluids/Electrolytes/Nutrition: Monitor I/O. Check lytes in am.  8.  Acute  on chronic CKD: Monitor with serial check. SCr improving.  9. MG: Managed on mestinon and chronic steriods. 10 CML:  In remission on Gleveec  11. Frequency: Will check PVRs as voiding every hour. Pt states that he would like lasix reduced 12. Acute on chronic anemia: Continue to monitor. 13. Leucocytosis: Reactive --resolving.  14. HTN: Monitor BP bid--continue Lasix and Imdur.   15. Insomnia: Trazodone added to help with sleep wake cycle.  16. PVD s/p stenting: On Plavix and Crestor.        Post Admission Physician Evaluation: 1. Functional deficits secondary  to right AKA. 2. Patient is admitted to receive collaborative, interdisciplinary care between the physiatrist, rehab nursing staff, and therapy team. 3. Patient's level of medical complexity and substantial therapy needs in context of that medical necessity cannot be provided at a lesser intensity of care such as a SNF. 4. Patient has experienced substantial functional loss from his/her baseline which was documented above under the "Functional History" and "Functional Status" headings.  Judging by the patient's diagnosis, physical exam, and functional history, the patient has potential for functional progress which will result in measurable gains while on  inpatient rehab.  These gains will be of substantial and practical use upon discharge  in facilitating mobility and self-care at the household level. 5. Physiatrist will provide 24 hour management of medical needs as well as oversight of the therapy plan/treatment and provide guidance as appropriate regarding the interaction of the two. 6. The Preadmission Screening has been reviewed and patient status is unchanged unless otherwise stated above. 7. 24 hour rehab nursing will assist with bladder management, bowel management, safety, skin/wound care, disease management, medication administration, pain management and patient education  and help integrate therapy concepts, techniques,education, etc. 8. PT will assess and treat for/with: Lower extremity strength, range of motion, stamina, balance, functional mobility, safety, adaptive techniques and equipment, pain mgt, pre-prosthetic education.   Goals are: mod I to supervision. 9. OT will assess and treat for/with: ADL's, functional mobility, safety, upper extremity strength, adaptive techniques and equipment, pain mgt, ego support, community reentry.   Goals are: supervision to mod I. Therapy may proceed with showering this patient. 10. SLP will assess and treat for/with: n/a.  Goals are: n/a. 11. Case Management and Social Worker will assess and treat for psychological issues and discharge planning. 12. Team conference will be held weekly to assess progress toward goals and to determine barriers to discharge. 13. Patient will receive at least 3 hours of therapy per day at least 5 days per week. 14. ELOS: 7-10 days, stairs may be a barrier to discharge (split level home)       15. Prognosis:  good     I have personally performed a face to face diagnostic evaluation of this patient and formulated the key components of the plan.  Additionally, I have personally reviewed laboratory data, imaging studies, as well as relevant notes and concur with the  physician assistant's documentation above.   Meredith Staggers, MD, Mellody Drown     Bary Leriche, PA-C 02/10/2018  The patient's status has not changed. The original post admission physician evaluation remains appropriate, and any changes from the pre-admission screening or documentation from the acute chart are noted above.  Meredith Staggers, MD 02/10/2018

## 2018-02-10 NOTE — Care Management Note (Signed)
Case Management Note Marvetta Gibbons RN, BSN Transitions of Care Unit 4E- RN Case Manager [phone]  Patient Details  Name: [name] MRN: [id number] Date of Birth: 07/02/1940  Subjective/Objective:   Pt admitted with ischemic foot, s/p AKA on 12/30                 Action/Plan: PTA pt lived at home, CIR consulted for possible admission.   Expected Discharge Date:  02/10/18               Expected Discharge Plan:  [facility]  In-House Referral:  Clinical Social Work  Discharge planning Services  CM Consult  Post Acute Care Choice:  IP Rehab Choice offered to:  Patient  DME Arranged:    DME Agency:     HH Arranged:    [place] Agency:     Status of Service:  Completed, signed off  If discussed at [employer], dates discussed:    Discharge Disposition: IP rehab   Additional Comments:  02/10/18- 1245- Marvetta Gibbons RN, CM- have spoken with Genie from IP rehab- they will offer bed with [facility] today- pt is medically stable for transition to rehab. Plan to transition to IP rehab later today.   Dawayne Patricia, RN 02/10/2018, 12:53 PM

## 2018-02-10 NOTE — Telephone Encounter (Signed)
sch appt lvm mld ltr 03/16/2018 4pm p/o MD

## 2018-02-10 NOTE — Progress Notes (Signed)
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   OT comments  Pt is highly motivated to return to go to rehab and return home. Transferred using lateral scoot to drop arm recliner with min guard assist. Began education in LB ADL and pericare leaning side to side. Pt tolerating tx well.  Follow Up Recommendations  CIR    Equipment Recommendations  Other (comment)(defer to next venue)    Recommendations for Other Services      Precautions / Restrictions Precautions Precautions: Fall Restrictions Weight Bearing Restrictions: No       Mobility Bed Mobility               General bed mobility comments: pt seated at EOB upon arrival  Transfers Overall transfer level: Needs assistance Equipment used: None Transfers: Lateral/Scoot Transfers          Lateral/Scoot Transfers: Min guard General transfer comment: scooted toward L side from bed to chair with min guard assist and verbal cues to drop arm chair, instructed pt and RN go back to bed toward his strong side when he is ready to return     Balance Overall balance assessment: Needs assistance   Sitting balance-Leahy Scale: Fair                                     ADL either performed or assessed with clinical judgement   ADL       Grooming: Set up;Sitting;Oral care                                 General ADL Comments: educated in LB ADL and pericare leaning side to side in sitting     Vision       Perception     Praxis      Cognition Arousal/Alertness:  Awake/alert Behavior During Therapy: WFL for tasks assessed/performed Overall Cognitive Status: Within Functional Limits for tasks assessed                                          Exercises     Shoulder Instructions       General Comments      Pertinent Vitals/ Pain       Pain Assessment: Faces Faces Pain Scale: Hurts a little bit Pain Location: R residual limb Pain Descriptors / Indicators: Sore Pain Intervention(s): Monitored during session;Repositioned  Home Living                                          Prior Functioning/Environment              Frequency  Min 2X/week        Progress Toward Goals  OT Goals(current goals can now be found in the care plan section)  Progress towards OT goals: Progressing toward goals  Acute Rehab OT  Goals Patient Stated Goal: go to rehab the go home OT Goal Formulation: With patient Time For Goal Achievement: 02/22/18 Potential to Achieve Goals: Good  Plan Discharge plan remains appropriate    Co-evaluation                 AM-PAC OT "6 Clicks" Daily Activity     Outcome Measure   Help from another person eating meals?: None Help from another person taking care of personal grooming?: A Little Help from another person toileting, which includes using toliet, bedpan, or urinal?: A Lot Help from another person bathing (including washing, rinsing, drying)?: A Little Help from another person to put on and taking off regular upper body clothing?: None Help from another person to put on and taking off regular lower body clothing?: A Lot 6 Click Score: 18    End of Session    OT Visit Diagnosis: Unsteadiness on feet (R26.81);Other abnormalities of gait and mobility (R26.89);Muscle weakness (generalized) (M62.81);Pain Pain - Right/Left: Right Pain - part of body: Leg   Activity Tolerance Patient tolerated treatment well   Patient Left in chair;with call bell/phone within  reach;with nursing/sitter in room;with family/visitor present   Nurse Communication Mobility status        Time: 4163-8453 OT Time Calculation (min): 14 min  Charges: OT General Charges $OT Visit: 1 Visit OT Treatments $Therapeutic Activity: 8-22 mins  Nestor Lewandowsky, OTR/L Acute Rehabilitation Services Pager: [phone] Office: [phone]   Malka So 02/10/2018, 10:55 AM

## 2018-02-10 NOTE — Progress Notes (Signed)
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  A & P  Right lower extremity ischemia secondary to peripheral arterial disease, severe tibial occlusive disease.  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 --Hemoglobin stable.  Will restart anticoagulation with apixaban.  PMH PE, DVT, stroke, treated with apixaban as an outpatient, DVT 2013 --Start apixaban again today  Acute kidney injury superimposed on CKD stage III --At baseline.  Coronary artery disease --Remains asymptomatic.  Continue medical management with Imdur, Crestor.  Resume Plavix 1/3.  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  Continue sliding scale insulin  PMH PE   CBC and BMP stable.  Resume apixaban today, Plavix tomorrow.  Can transfer to CIR today.  DVT prophylaxis: heparin Code Status: Full Family Communication:  Disposition Plan: CIR   Murray Hodgkins, MD  [facility] Direct contact: [phone] --Via amion app OR  --[email or web address]; password [email or web address]  7PM-7AM contact night coverage as above 02/10/2018, 10:10 AM  LOS: 5 days   Consultants:  Vascular surgery  PMR  Procedures:  Right above-knee  amputation 12/30  Antimicrobials:  Ceftriaxone   Vancomycin  Interval history/Subjective: Follow-up right above-the-knee amputation, anemia  Slept poorly but otherwise feels well, pain well controlled.  Anxious to start rehab.  Objective: Vitals:  Vitals:   02/09/18 2129 02/10/18 0422  BP: 118/60 (!) 142/64  Pulse: 91 92  Resp: 18 18  Temp: 98.2 F (36.8 C) 97.8 F (36.6 C)  SpO2: 100% 98%    Exam: Constitutional:   . Appears calm and comfortable Eyes:  . pupils and irises appear normal ENMT:  . grossly normal hearing  Respiratory:  . CTA bilaterally, no w/r/r.  . Respiratory effort normal. Cardiovascular:  . RRR, no m/r/g . No LLE extremity edema   Psychiatric:  . Mental status o Mood, affect appropriate  I have personally reviewed the following:   Data: . Urine output 525, voids x2, BM x1 . CBG stable.  Renal function appears to be at baseline, creatinine 1.87.  BUN within normal limits.  Potassium within normal limits. . Hemoglobin stable 8.4.  WBC trending down at 13.1  Scheduled Meds: . brimonidine  1 drop Both Eyes BID  . cholecalciferol  1,000 Units Oral Daily  . furosemide  20 mg Oral Daily  . hydrocortisone sod succinate (SOLU-CORTEF) inj  50 mg Intravenous Daily  . imatinib  300 mg Oral Q lunch  . insulin aspart  0-9 Units Subcutaneous TID WC  . isosorbide mononitrate  30 mg Oral Daily  . multivitamin with minerals  1 tablet Oral Daily  . pantoprazole  40 mg Oral Daily  . polyethylene glycol  17 g Oral BID  . predniSONE  3 mg Oral Q breakfast  . pyridostigmine  60 mg Oral Daily  . rosuvastatin  5 mg Oral Daily  . timolol  1 drop Both  Eyes BID  . traZODone  50 mg Oral QHS   Continuous Infusions: . lactated ringers 10 mL/hr at 02/07/18 1255    Principal Problem:   Ischemic foot Active Problems:   Chronic myeloid leukemia in remission ([place])   Essential hypertension   CAD (coronary artery disease)   Diabetes mellitus type 2,  controlled ([place])   History of pulmonary embolism   History of DVT (deep vein thrombosis)   Ocular myasthenia (HCC)   PAD (peripheral artery disease) (HCC)   AKI (acute kidney injury) ([place])   CKD (chronic kidney disease), stage III (HCC)   Acute blood loss anemia   Post-operative pain   Unilateral AKA, right (HCC)   Leukocytosis   LOS: 5 days

## 2018-02-10 NOTE — Progress Notes (Addendum)
[facility]:   POD 3: s/p right above-the-knee amputation.  His amputation site is healing nicely.  I would continue dressing changes.  Okay to resume apixaban from my standpoint.  CIR evaluation.  [name], MD, FACS Beeper [phone] Office: [phone]   Subjective  - Doing well over all.   Objective (!) 142/64 92 97.8 F (36.6 C) (Oral) 18 98%  Intake/Output Summary (Last 24 hours) at 02/10/2018 0706 Last data filed at 02/10/2018 0422 Gross per 24 hour  Intake -  Output 525 ml  Net -525 ml    Right stump appears viable, minimal SS drainage on dressing  Assessment/Planning: POD # Right AKA  Pending CIR F/U in our office in 4 weeks.  [name] 02/10/2018 7:06 AM --  Laboratory Lab Results: Recent Labs    02/09/18 1819 02/10/18 0258  WBC 16.8* 13.1*  HGB 8.7* 8.4*  HCT 25.7* 25.1*  PLT 235 216   BMET Recent Labs    02/08/18 0708 02/09/18 0902  NA 137 135  K 4.5 4.2  CL 103 101  CO2 23 23  GLUCOSE 168* 196*  BUN 14 24*  CREATININE 1.72* 2.01*  CALCIUM 8.2* 8.5*    COAG Lab Results  Component Value Date   INR 1.14 02/07/2018   INR 1.39 02/05/2018   INR 1.42 12/23/2017   No results found for: PTT

## 2018-02-10 NOTE — Discharge Instructions (Signed)

## 2018-02-10 NOTE — Telephone Encounter (Signed)
-----   Message from Gabriel Earing, [place] sent at 02/09/2018  7:47 AM EST ----- S/p right AKA 02/07/18.  F/u with Dr. Scot Dock in 4 weeks.  Thanks

## 2018-02-10 NOTE — Discharge Summary (Signed)
Physician Discharge Summary  DANON LOGRASSO IFO:[id number] DOB: [birth year] DOA: 02/05/2018  PCP: Libby Maw, MD  Admit date: 02/05/2018 Discharge date: 02/10/2018  Recommendations for Outpatient Follow-up:   Right lower extremity ischemia secondary to peripheral arterial disease, severe tibial occlusive disease.  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 Scot Dock in 4 weeks for staple removal --Continue dressing changes   Follow-up Information    [name], MD In 4 weeks.   Specialties:  Vascular Surgery, Cardiology Why:  Office will call you to arrange your appt (sent) Contact information: [place] [address] [phone]            Discharge Diagnoses: Principal diagnosis is #1 1. Right lower extremity ischemia secondary to peripheral arterial disease, severe tibial occlusive disease 2. Acute blood loss anemia 3. PMH PE, DVT, stroke 4. Acute kidney injury superimposed on CKD stage III 5. Coronary artery disease 6. 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 CML in remission 8. Diet-controlled diabetes mellitus type 2  Discharge Condition: improved Disposition: CIR  Diet recommendation: heart healthy diabetic diet  Filed Weights   02/05/18 1750 02/06/18 0416 02/07/18 1248  Weight: 74.1 kg 74.5 kg 74.5 kg    History of present illness:  78 year old man PMH peripheral arterial disease, underwent arteriography with endovascular treatment of right superficial femoral artery and popliteal occlusion, since that time had persistent pain and worsening symptoms in the right foot.  Admitted for right leg ischemia.  Hospital Course:  Patient was seen by vascular surgery and underwent right AKA 66/29 with only complication being acute blood loss anemia.  Followed by vascular surgery with recommendation for follow-up in the office in 4 weeks for staple removal, continue dressing changes.  Seen by physical therapy with  recommendation for CIR.  Other issues included acute blood loss anemia which did require transfusion 2 units PRBC but has stabilized and acute kidney injury which resolved.  See individual issues as below.  Right lower extremity ischemia secondary to peripheral arterial disease, severe tibial occlusive disease.  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 --Hemoglobin stable.  Will restart anticoagulation with apixaban.  PMH PE, DVT, stroke, treated with apixaban as an outpatient, DVT 2013 --Start apixaban again today  Acute kidney injury superimposed on CKD stage III --At baseline.  Coronary artery disease --Remains asymptomatic.  Continue medical management with Imdur, Crestor.  Resume Plavix 1/3.  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  Continue sliding scale insulin  Consultants:  Vascular surgery  PMR  Procedures:  Right above-knee amputation 12/30  Antimicrobials:  Ceftriaxone   Vancomycin  Today's assessment 1/2: See progress note dated same day  Discharge Instructions  Link to reproduce medications appears not to be functioning Continue home medications including Combigan ophthalmic drops 1 drop both eyes twice daily Vitamin D 1000 units daily Plavix 75 mg daily starting 1/3 Eliquis 5 mg twice daily Lasix 40 mg daily Gleevec 300 mg daily with lunch and large glass of water Imdur 30 mg daily Lisinopril 10 mg daily Multivitamin daily Prednisone 3 mg daily Mestinon 60 mg daily Crestor 5 mg daily Oxycodone 5 mg by  mouth every  6 hours as needed for moderate pain  Allergies  Allergen Reactions  . Other Other (See Comments)    Regarding scans, the PATIENT IS CLAUSTROPHOBIC!!!  . Phenergan [Promethazine Hcl] Swelling and Other (See Comments)    Cannot be combined with Zofran because swelling of the eyes resulted and patient became very restless-  . Zofran [Ondansetron Hcl] Swelling and Other (See Comments)    Cannot be combined with Phenergan because swelling of the eyes resulted and patient became very restless-  . Sulfamethoxazole Rash and Itching    The results of significant diagnostics from this hospitalization (including imaging, microbiology, ancillary and laboratory) are listed below for reference.    Significant Diagnostic Studies: Dg Femur Min 2 Views Left  Result Date: 01/30/2018 CLINICAL DATA:  [place] at home 5 days ago when LEFT leg gave out, LEFT anterior femur pain, multiple recent falls EXAM: LEFT FEMUR 2 VIEWS COMPARISON:  None FINDINGS: Osseous mineralization diffusely decreased. Hip and knee joint alignment normal. Mild joint space narrowing at LEFT knee identified. No acute fracture, dislocation, or bone destruction. Visualized LEFT pelvis intact. Extensive atherosclerotic calcifications in pelvis and LEFT leg. Stent identified at distal superficial femoral artery. IMPRESSION: No acute osseous abnormalities. Extensive atherosclerotic calcification. Electronically Signed   By: Lavonia Dana M.D.   On: 01/30/2018 17:08    Microbiology: Recent Results (from the past 240 hour(s))  Surgical PCR screen     Status: None   Collection Time: 02/06/18  1:41 AM  Result Value Ref Range Status   MRSA, PCR NEGATIVE NEGATIVE Final   Staphylococcus aureus NEGATIVE NEGATIVE Final    Comment: (NOTE) The Xpert SA Assay (FDA approved for NASAL specimens in patients 74 years of age and older), is one component of a comprehensive surveillance program. It is not intended to diagnose infection nor to guide or monitor  treatment. Performed at [facility], [place] [address], [place], [place] [address]      Labs: Basic Metabolic Panel: Recent Labs  Lab 02/06/18 0348 02/07/18 0224 02/08/18 0708 02/09/18 0902 02/10/18 0801  NA 137 137 137 135 139  K 3.5 3.6 4.5 4.2 3.8  CL 97* 99 103 101 103  CO2 26 27 23 23 25   GLUCOSE 85 97 168* 196* 115*  BUN 14 12 14  24* 18  CREATININE 1.89* 1.75* 1.72* 2.01* 1.87*  CALCIUM 8.3* 8.5* 8.2* 8.5* 8.8*   Liver Function Tests: Recent Labs  Lab 02/05/18 1149  AST 34  ALT 17  ALKPHOS 63  BILITOT 0.9  PROT 6.2*  ALBUMIN 3.6   CBC: Recent Labs  Lab 02/05/18 1149  02/08/18 0708 02/08/18 1653 02/09/18 0902 02/09/18 1819 02/10/18 0258  WBC 11.0*   < > 17.7* 21.3* 17.4* 16.8* 13.1*  NEUTROABS 7.8*  --   --   --   --   --   --   HGB 11.8*   < > 7.2* 5.8* 9.5* 8.7* 8.4*  HCT 35.7*   < > 22.5* 18.2* 27.8* 25.7* 25.1*  MCV 94.2   < > 94.9 97.8 92.1 93.1 90.9  PLT 318   < > 271 277 253 235 216   < > = values in this interval not displayed.   CBG: Recent Labs  Lab 02/09/18 1133 02/09/18 1556 02/09/18 2131 02/10/18 0614 02/10/18 1124  GLUCAP 175* 128* 128* 97 116*    Principal Problem:   Ischemic foot Active Problems:   Chronic myeloid leukemia in remission [facility])   Essential  hypertension   CAD (coronary artery disease)   Diabetes mellitus type 2, controlled ([place])   History of pulmonary embolism   History of DVT (deep vein thrombosis)   Ocular myasthenia (HCC)   PAD (peripheral artery disease) (HCC)   AKI (acute kidney injury) ([place])   CKD (chronic kidney disease), stage III (HCC)   Acute blood loss anemia   Post-operative pain   Unilateral AKA, right (HCC)   Leukocytosis   Time coordinating discharge: 35 minutes  Signed:  Murray Hodgkins, MD [facility] 02/10/2018, 12:46 PM

## 2018-02-10 NOTE — H&P (Signed)
Physical Medicine and Rehabilitation Admission H&P    Chief Complaint  Patient presents with  . 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 He underwent R-AKA by Dr. Scot Dock on 02/07/18. Post surgery, pain slowly improving and stump sock ordered by [employer]. Therapy ongoing and CIR recommended due to functional deficits.    Review of Systems  Constitutional: Negative for chills and fever.  HENT: Negative for hearing loss and tinnitus.   Eyes: Negative for blurred vision and double vision.  Respiratory: Negative for cough and shortness of breath.   Cardiovascular: Negative for chest pain and palpitations.  Gastrointestinal: Negative for heartburn and nausea.  Genitourinary: Positive for frequency and urgency. Negative for dysuria.  Musculoskeletal: Negative for back pain, joint pain and myalgias.  Skin: Negative for rash.  Neurological: Positive for dizziness, sensory change and weakness. Negative for headaches.  Psychiatric/Behavioral: The patient has insomnia. The patient is not nervous/anxious.       Past Medical History:  Diagnosis Date  . Bruises easily    d/t being on Eliquis  . Carotid artery disease ([place])    a. Duplex 05/2014: 73-53% RICA, 2-99% LICA, elevated velocities in right subclavian artery, normal left subclavian artery..  . Chronic back pain    HNP  . Chronic kidney disease (CKD)   . Claustrophobia    takes Ativan as needed  . 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)    takes Gleevec daily  . Coronary atherosclerosis of unspecified type of vessel, native or graft    a. cath in 2003 showed 10-20% LM, scattered 20% prox-mid LAD, 30% more distal LAD, 50-60% stenosis of LAD towards apex, 20% Cx, 30% dRCA, focal 95% stenosis in smaller of 2 branches of  PDA, EF 60-65%.  . Diabetes mellitus ([place])   . Diarrhea    takes Imodium daily as needed  . Diverticulosis   . Diverticulosis of colon (without mention of hemorrhage)   . DJD (degenerative joint disease)   . DVT (deep venous thrombosis) ([place]) 07/2011  . Esophageal stricture   . Essential hypertension    takes Imdur and Lisinopril daily  . Gastric polyps    benign  . GERD (gastroesophageal reflux disease)    takes Nexium daily  . Glaucoma    uses eye drops  . History of bronchitis    not sure when the last time  . History of colon polyps    benign  . History of kidney stones   . History of kidney stones   . Insomnia    takes Melatonin nightly as needed  . Mixed hyperlipidemia    takes Crestor daily  . Myasthenia gravis ([place])    uses prednisone  . Osteoporosis 2016  . Peripheral edema    takes Lasix daily  . Pituitary tumor    [facility]den [facility] checks it every Jan   . Pneumonia 01/2016  . PONV (postoperative nausea and vomiting)   . Ptosis   . Pulmonary embolus ([place]) 2012  . Stroke [facility]) 06/2015    Past Surgical History:  Procedure Laterality Date  . ABDOMINAL AORTOGRAM W/LOWER EXTREMITY N/A 11/18/2016   Procedure: ABDOMINAL AORTOGRAM W/LOWER EXTREMITY;  Surgeon: [name], MD;  Location: [place] [facility];  Service: Cardiovascular;  Laterality:  N/A;  . AMPUTATION Right 02/07/2018   Procedure: right leg AMPUTATION ABOVE KNEE;  Surgeon: [name], MD;  Location: [place];  Service: Vascular;  Laterality: Right;  . AORTOGRAM Right 12/23/2017   Procedure: AORTOGRAM, RIGHT LOWER EXTREMITY ANGIOGRAM, RIGHT SUPERFICIAL FEMORAL ARTERY ANGIOPLASTY WITH STENT, RIGHT POPLITEAL ARTERY ANGIOPLASTY WITH STENT, LEFT GROIN CUTDOWN AND LEFT FEMORAL ARTERY REPAIR;  Surgeon: [name], MD;  Location: [place];  Service: Vascular;  Laterality: Right;  . BACK SURGERY     x2, lumbar and cervical  . CARDIAC CATHETERIZATION    . CATARACT EXTRACTION  Bilateral 12/12  . COLONOSCOPY    . ESOPHAGOGASTRODUODENOSCOPY ENDOSCOPY  04/2015  . IR GENERIC HISTORICAL  03/03/2016   IR IVC FILTER PLMT / S&I [name] GUID/MOD SED 03/03/2016 [name], MD [facility]INTER[facility] RAD  . IR GENERIC HISTORICAL  04/09/2016   IR RADIOLOGIST EVAL & MGMT 04/09/2016 [name], MD [facility] INTER[facility] RAD  . IR GENERIC HISTORICAL  04/30/2016   IR IVC FILTER RETRIEVAL / S&I [name] GUID/MOD SED 04/30/2016 [name], MD [facility]-INTERV RAD  . LITHOTRIPSY    . LUMBAR LAMINECTOMY/DECOMPRESSION MICRODISCECTOMY Right 09/14/2012   Procedure: Right Lumbar three-four, four-five, Lumbar five-Sacral one decompressive laminectomy;  Surgeon: [name], MD;  Location: [place] [facility];  Service: Neurosurgery;  Laterality: Right;  . LUMBAR LAMINECTOMY/DECOMPRESSION MICRODISCECTOMY Left 03/11/2016   Procedure: Left Lumbar Two-ThreeMicrodiscectomy;  Surgeon: [name], MD;  Location: [place];  Service: Neurosurgery;  Laterality: Left;  . PERIPHERAL VASCULAR INTERVENTION  11/18/2016   Procedure: PERIPHERAL VASCULAR INTERVENTION;  Surgeon: [name], MD;  Location: [place] [facility];  Service: Cardiovascular;;  Left popliteal  . SHOULDER SURGERY Left 05/07/2009  . TONSILLECTOMY      Family History  Problem Relation Age of Onset  . Heart disease Mother   . Heart attack Mother   . Stroke Mother   . Heart disease Father   . Heart attack Father   . Stroke Father   . Breast cancer Sister        Twin   . Heart attack Sister   . Dementia Sister   . Heart disease Sister   . Heart attack Sister   . Clotting disorder Sister   . Heart attack Sister   . Colon cancer Neg Hx     Social History:  reports that he has never smoked. He has never used smokeless tobacco. He reports that he does not drink alcohol or use drugs.    Allergies  Allergen Reactions  . Other Other (See Comments)    Regarding scans, the PATIENT IS CLAUSTROPHOBIC!!!  . Phenergan [Promethazine Hcl] Swelling and Other (See  Comments)    Cannot be combined with Zofran because swelling of the eyes resulted and patient became very restless-  . Zofran [Ondansetron Hcl] Swelling and Other (See Comments)    Cannot be combined with Phenergan because swelling of the eyes resulted and patient became very restless-  . Sulfamethoxazole Rash and Itching    Medications Prior to Admission  Medication Sig Dispense Refill  . brimonidine-timolol (COMBIGAN) 0.2-0.5 % ophthalmic solution Place 1 drop into both eyes 2 (two) times daily.     . cholecalciferol (VITAMIN D) 1000 units tablet Take 1,000 Units by mouth daily.     . clopidogrel (PLAVIX) 75 MG tablet Take 1 tablet (75 mg total) by mouth daily. 30 tablet 6  . ELIQUIS 5 MG TABS tablet TAKE 1 TABLET (5 MG TOTAL) BY MOUTH 2 (TWO)  TIMES DAILY. 60 tablet 3  . furosemide (LASIX) 40 MG tablet Take 1 tablet (40 mg total) by mouth daily. 90 tablet 3  . imatinib (GLEEVEC) 100 MG tablet Take 300 mg by mouth daily with lunch. Take with noon meal and a large glass of water.Caution:Chemotherapy    . isosorbide mononitrate (IMDUR) 30 MG 24 hr tablet Take 1 tablet (30 mg total) by mouth daily. 90 tablet 3  . lidocaine (LIDODERM) 5 % Place 1 patch onto the skin daily as needed (for back pain). Remove & Discard patch within 12 hours or as directed by MD     . lisinopril (PRINIVIL,ZESTRIL) 10 MG tablet Take 1 tablet (10 mg total) by mouth daily. 30 tablet 11  . loperamide (IMODIUM) 2 MG capsule Take 2-4 mg by mouth 3 (three) times daily as needed for diarrhea or loose stools.     . Multiple Vitamin (MULTIVITAMIN) tablet Take 1 tablet by mouth daily.    Marland Kitchen oxyCODONE (OXY IR/ROXICODONE) 5 MG immediate release tablet Take 1 tablet (5 mg total) by mouth every 6 (six) hours as needed for breakthrough pain. 25 tablet 0  . potassium chloride (K-DUR) 10 MEQ tablet Take 1 tablet (10 mEq total) by mouth daily. 90 tablet 3  . predniSONE (DELTASONE) 1 MG tablet TAKE 3 TABLETS BY MOUTH ONCE DAILY WITH  BREAKFAST (Patient taking differently: Take 3 mg by mouth daily with breakfast. ) 270 tablet 0  . pyridostigmine (MESTINON) 60 MG tablet Take 60 mg by mouth daily.    . rosuvastatin (CRESTOR) 10 MG tablet Take 5 mg by mouth daily.    . traMADol (ULTRAM) 50 MG tablet Take 1 tablet (50 mg total) by mouth every 6 (six) hours as needed for moderate pain or severe pain. 15 tablet 0  . oxyCODONE-acetaminophen (PERCOCET/ROXICET) 5-325 MG tablet Take 1 tablet by mouth every 6 (six) hours as needed (for pain).   0    Drug Regimen Review  Drug regimen was reviewed and remains appropriate with no significant issues identified  Home: Home Living Family/patient expects to be discharged to:: Private residence Living Arrangements: Spouse/significant other Available Help at Discharge: Family, Available 24 hours/day Type of Home: House Home Access: Stairs to enter [employer] of Steps: 3 from porch, then 9 to first floor  Entrance Stairs-Rails: Left Home Layout: Multi-level Alternate Level Stairs-Number of Steps: 9 Alternate Level Stairs-Rails: Left Bathroom Shower/Tub: Multimedia programmer: Handicapped height Home Equipment: [place] in, Environmental consultant - 2 wheels, [place] - single point Additional Comments: Walk-in shower with built in bench   Functional History: Prior Function Level of Independence: Needs assistance Gait / Transfers Assistance Needed: pt used RW ADL's / Homemaking Assistance Needed:  assistance for LB dressing  Comments: Enjoys spending time with family  Functional Status:  Mobility: Bed Mobility Overal bed mobility: Needs Assistance Bed Mobility: Sit to Supine Supine to sit: Min assist Sit to supine: Min assist General bed mobility comments: Assist to bring LLE up into bed Transfers Overall transfer level: Needs assistance Equipment used: Ambulation equipment used Transfers: Sit to/from Stand Sit to Stand: +2 physical assistance, Mod assist Stand  pivot transfers: Mod assist, +2 physical assistance  Lateral/Scoot Transfers: Min guard General transfer comment: Assist to bring hips up and for balance. Stood x 2 for ~20 sec. Used [place] to go from recliner to bed      ADL: ADL Overall ADL's : Needs assistance/impaired Eating/Feeding: Independent, Sitting Grooming: Set up, Sitting, Oral care Upper  Body Bathing: Min guard, Sitting Lower Body Bathing: Moderate assistance, Sitting/lateral leans Upper Body Dressing : Sitting Lower Body Dressing: Maximal assistance, Sitting/lateral leans Toilet Transfer: Moderate assistance, +2 for physical assistance, Cueing for safety, RW, Stand-pivot, BSC Toileting- Clothing Manipulation and Hygiene: Moderate assistance, Sitting/lateral lean Functional mobility during ADLs: Rolling walker, Moderate assistance, +2 for safety/equipment General ADL Comments: educated in LB ADL and pericare leaning side to side in sitting  Cognition: Cognition Overall Cognitive Status: Within Functional Limits for tasks assessed Orientation Level: Oriented X4 Cognition Arousal/Alertness: Awake/alert Behavior During Therapy: WFL for tasks assessed/performed Overall Cognitive Status: Within Functional Limits for tasks assessed   Blood pressure (!) 142/64, pulse 92, temperature 97.8 F (36.6 C), temperature source Oral, resp. rate 18, height 5' 6.93" (1.7 m), weight 74.5 kg, SpO2 98 %. Physical Exam  Nursing note and vitals reviewed. Constitutional: He is oriented to person, place, and time. He appears well-developed and well-nourished.  HENT:  Head: Normocephalic.  Eyes: Pupils are equal, round, and reactive to light.  Neck: Normal range of motion.  Cardiovascular: Normal rate. Exam reveals no friction rub.  No murmur heard. Respiratory: Effort normal. No respiratory distress. He has no wheezes.  GI: Soft. He exhibits no distension. There is no abdominal tenderness.  Musculoskeletal:        General: Edema  present.     Comments: Min edema R-AKA. Incision intact with staples in place and min-moderate serous drainage on abd pad. Left leg non-tender   Neurological: He is alert and oriented to person, place, and time. No cranial nerve deficit.  UE 5/5. RLE: can lift leg against gravity. LLE 4/5 prox to distal.   Skin: Skin is warm. No erythema.  Psychiatric: He has a normal mood and affect. His behavior is normal.    Results for orders placed or performed during the hospital encounter of 02/05/18 (from the past 48 hour(s))  Glucose, capillary     Status: Abnormal   Collection Time: 02/08/18  4:08 PM  Result Value Ref Range   Glucose-Capillary 162 (H) 70 - 99 mg/dL   Comment 1 Notify RN   APTT     Status: Abnormal   Collection Time: 02/08/18  4:53 PM  Result Value Ref Range   aPTT >200 (HH) 24 - 36 seconds    Comment:        IF BASELINE aPTT IS ELEVATED, SUGGEST PATIENT RISK ASSESSMENT BE USED TO DETERMINE APPROPRIATE ANTICOAGULANT THERAPY. REPEATED TO VERIFY CRITICAL RESULT CALLED TO, READ BACK BY AND VERIFIED WITH: Consuela Mimes RN @ 1913 ON 02/08/18 BY TEMOCHE, H Performed at [facility], [place] [address] [place], [name] [address]   Heparin level (unfractionated)     Status: Abnormal   Collection Time: 02/08/18  4:53 PM  Result Value Ref Range   Heparin Unfractionated >2.20 (H) 0.30 - 0.70 IU/mL    Comment: RESULTS CONFIRMED BY MANUAL DILUTION Performed at [facility], [place] [address] [place] [address]   CBC     Status: Abnormal   Collection Time: 02/08/18  4:53 PM  Result Value Ref Range   WBC 21.3 (H) 4.0 - 10.5 K/uL   RBC 1.86 (L) 4.22 - 5.81 MIL/uL   Hemoglobin 5.8 (LL) 13.0 - 17.0 g/dL    Comment: REPEATED TO VERIFY THIS CRITICAL RESULT HAS VERIFIED AND BEEN CALLED TO ANDREA SMITH HART,RN BY ZELDA BEECH ON 12 31 2019 AT 1825, AND HAS BEEN READ BACK.     HCT 18.2 (  L) 39.0 - 52.0 %   MCV 97.8 80.0 - 100.0 fL   MCH 31.2 26.0 - 34.0 pg   MCHC 31.9 30.0  - 36.0 g/dL   RDW 14.5 11.5 - 15.5 %   Platelets 277 150 - 400 K/uL   nRBC 0.0 0.0 - 0.2 %    Comment: Performed at [facility], [place] [address], [place], [place] [address]  Prepare RBC     Status: None   Collection Time: 02/08/18  6:46 PM  Result Value Ref Range   Order Confirmation      ORDER PROCESSED BY BLOOD BANK Performed at [facility], [place] [address], [place], [place] [address]   Glucose, capillary     Status: Abnormal   Collection Time: 02/08/18  9:21 PM  Result Value Ref Range   Glucose-Capillary 168 (H) 70 - 99 mg/dL  Type and screen [place]     Status: None (Preliminary result)   Collection Time: 02/08/18 10:37 PM  Result Value Ref Range   ABO/RH(D) A POS    Antibody Screen NEG    Sample Expiration 02/11/2018    Unit Number [id number]    Blood Component Type RED CELLS,LR    Unit division 00    Status of Unit ALLOCATED    Transfusion Status OK TO TRANSFUSE    Crossmatch Result Compatible    Unit Number [id number]    Blood Component Type RED CELLS,LR    Unit division 00    Status of Unit ISSUED,FINAL    Transfusion Status OK TO TRANSFUSE    Crossmatch Result      Compatible Performed at [facility], [place] [address] [place], [place] [address]    Unit Number [id number]    Blood Component Type RED CELLS,LR    Unit division 00    Status of Unit ISSUED,FINAL    Transfusion Status OK TO TRANSFUSE    Crossmatch Result Compatible   Glucose, capillary     Status: Abnormal   Collection Time: 02/09/18  6:29 AM  Result Value Ref Range   Glucose-Capillary 136 (H) 70 - 99 mg/dL  CBC     Status: Abnormal   Collection Time: 02/09/18  9:02 AM  Result Value Ref Range   WBC 17.4 (H) 4.0 - 10.5 K/uL   RBC 3.02 (L) 4.22 - 5.81 MIL/uL   Hemoglobin 9.5 (L) 13.0 - 17.0 g/dL    Comment: REPEATED TO VERIFY POST TRANSFUSION SPECIMEN    HCT 27.8 (L) 39.0 - 52.0 %   MCV 92.1 80.0 - 100.0 fL   MCH 31.5 26.0 - 34.0 pg   MCHC 34.2  30.0 - 36.0 g/dL   RDW 14.1 11.5 - 15.5 %   Platelets 253 150 - 400 K/uL   nRBC 0.0 0.0 - 0.2 %    Comment: Performed at [facility], [place]. [address], [place], [place] [address]  Basic metabolic panel     Status: Abnormal   Collection Time: 02/09/18  9:02 AM  Result Value Ref Range   Sodium 135 135 - 145 mmol/L   Potassium 4.2 3.5 - 5.1 mmol/L   Chloride 101 98 - 111 mmol/L   CO2 23 22 - 32 mmol/L   Glucose, Bld 196 (H) 70 - 99 mg/dL   BUN 24 (H) 8 - 23 mg/dL   Creatinine, Ser 2.01 (H) 0.61 - 1.24 mg/dL   Calcium 8.5 (L) 8.9 - 10.3 mg/dL   GFR  calc non Af Amer 31 (L) >60 mL/min   GFR calc Af Amer 36 (L) >60 mL/min   Anion gap 11 5 - 15    Comment: Performed at [facility] [address] [place], [name] [address]  Glucose, capillary     Status: Abnormal   Collection Time: 02/09/18 11:33 AM  Result Value Ref Range   Glucose-Capillary 175 (H) 70 - 99 mg/dL   Comment 1 Notify RN    Comment 2 Document in Chart   Glucose, capillary     Status: Abnormal   Collection Time: 02/09/18  3:56 PM  Result Value Ref Range   Glucose-Capillary 128 (H) 70 - 99 mg/dL   Comment 1 Notify RN    Comment 2 Document in Chart   CBC     Status: Abnormal   Collection Time: 02/09/18  6:19 PM  Result Value Ref Range   WBC 16.8 (H) 4.0 - 10.5 K/uL   RBC 2.76 (L) 4.22 - 5.81 MIL/uL   Hemoglobin 8.7 (L) 13.0 - 17.0 g/dL   HCT 25.7 (L) 39.0 - 52.0 %   MCV 93.1 80.0 - 100.0 fL   MCH 31.5 26.0 - 34.0 pg   MCHC 33.9 30.0 - 36.0 g/dL   RDW 14.6 11.5 - 15.5 %   Platelets 235 150 - 400 K/uL   nRBC 0.2 0.0 - 0.2 %    Comment: Performed at [facility], [place] [address] [place], [name] [address]  Glucose, capillary     Status: Abnormal   Collection Time: 02/09/18  9:31 PM  Result Value Ref Range   Glucose-Capillary 128 (H) 70 - 99 mg/dL   Comment 1 Notify RN    Comment 2 Document in Chart   CBC     Status: Abnormal   Collection Time: 02/10/18  2:58 AM  Result Value Ref Range   WBC  13.1 (H) 4.0 - 10.5 K/uL   RBC 2.76 (L) 4.22 - 5.81 MIL/uL   Hemoglobin 8.4 (L) 13.0 - 17.0 g/dL   HCT 25.1 (L) 39.0 - 52.0 %   MCV 90.9 80.0 - 100.0 fL   MCH 30.4 26.0 - 34.0 pg   MCHC 33.5 30.0 - 36.0 g/dL   RDW 14.6 11.5 - 15.5 %   Platelets 216 150 - 400 K/uL   nRBC 0.2 0.0 - 0.2 %    Comment: Performed at [facility], [place] [address], [place], [name] [address]  Glucose, capillary     Status: None   Collection Time: 02/10/18  6:14 AM  Result Value Ref Range   Glucose-Capillary 97 70 - 99 mg/dL   Comment 1 Notify RN    Comment 2 Document in Chart   Basic metabolic panel     Status: Abnormal   Collection Time: 02/10/18  8:01 AM  Result Value Ref Range   Sodium 139 135 - 145 mmol/L   Potassium 3.8 3.5 - 5.1 mmol/L   Chloride 103 98 - 111 mmol/L   CO2 25 22 - 32 mmol/L   Glucose, Bld 115 (H) 70 - 99 mg/dL   BUN 18 8 - 23 mg/dL   Creatinine, Ser 1.87 (H) 0.61 - 1.24 mg/dL   Calcium 8.8 (L) 8.9 - 10.3 mg/dL   GFR calc non Af Amer 33 (L) >60 mL/min   GFR calc Af Amer 39 (L) >60 mL/min   Anion gap 11 5 - 15    Comment: Performed at [facility]  [facility], [address], [place], [place] [address]  Glucose, capillary     Status: Abnormal   Collection Time: 02/10/18 11:24 AM  Result Value Ref Range   Glucose-Capillary 116 (H) 70 - 99 mg/dL   Comment 1 Notify RN    Comment 2 Document in Chart    *Note: Due to a large number of results and/or encounters for the requested time period, some results have not been displayed. A complete set of results can be found in Results Review.   No results found.     Medical Problem List and Plan: 1.  Functional deficits secondary to PAD, right AKA.  -admit to inpatient rehab  -prosthetic education 2.  PE/DVT Anticoagulation: Pharmaceutical: Other (comment) Eliquis resumed today.  3. Pain Management: Oxycodone prn effective.  4. Mood: LCSW to follow for evaluation and support 5. Neuropsych: This patient is capable of making decisions  on his own behalf. 6. Skin/Wound Care: Monitor wound for healing, continue stump sock, convert to shrinker  -Continue protein supplement.  7. Fluids/Electrolytes/Nutrition: Monitor I/O. Check lytes in am.  8.  Acute on chronic CKD: Monitor with serial check. SCr improving.  9. MG: Managed on mestinon and chronic steriods. 10 CML:  In remission on Gleveec  11. Frequency: Will check PVRs as voiding every hour. Pt states that he would like lasix reduced 12. Acute on chronic anemia: Continue to monitor. 13. Leucocytosis: Reactive --resolving.  14. HTN: Monitor BP bid--continue Lasix and Imdur.   15. Insomnia: Trazodone added to help with sleep wake cycle.  16. PVD s/p stenting: On Plavix and Crestor.     Post Admission Physician Evaluation: 1. Functional deficits secondary  to right AKA. 2. Patient is admitted to receive collaborative, interdisciplinary care between the physiatrist, rehab nursing staff, and therapy team. 3. Patient's level of medical complexity and substantial therapy needs in context of that medical necessity cannot be provided at a lesser intensity of care such as a SNF. 4. Patient has experienced substantial functional loss from his/her baseline which was documented above under the "Functional History" and "Functional Status" headings.  Judging by the patient's diagnosis, physical exam, and functional history, the patient has potential for functional progress which will result in measurable gains while on inpatient rehab.  These gains will be of substantial and practical use upon discharge  in facilitating mobility and self-care at the household level. 5. Physiatrist will provide 24 hour management of medical needs as well as oversight of the therapy plan/treatment and provide guidance as appropriate regarding the interaction of the two. 6. The Preadmission Screening has been reviewed and patient status is unchanged unless otherwise stated above. 7. 24 hour rehab nursing will  assist with bladder management, bowel management, safety, skin/wound care, disease management, medication administration, pain management and patient education  and help integrate therapy concepts, techniques,education, etc. 8. PT will assess and treat for/with: Lower extremity strength, range of motion, stamina, balance, functional mobility, safety, adaptive techniques and equipment, pain mgt, pre-prosthetic education.   Goals are: mod I to supervision. 9. OT will assess and treat for/with: ADL's, functional mobility, safety, upper extremity strength, adaptive techniques and equipment, pain mgt, ego support, community reentry.   Goals are: supervision to mod I. Therapy may proceed with showering this patient. 10. SLP will assess and treat for/with: n/a.  Goals are: n/a. 11. Case Management and Social Worker will assess and treat for psychological issues and discharge planning. 12. Team conference will be held weekly to assess progress toward goals  and to determine barriers to discharge. 13. Patient will receive at least 3 hours of therapy per day at least 5 days per week. 14. ELOS: 7-10 days, stairs may be a barrier to discharge (split level home)       15. Prognosis:  good   I have personally performed a face to face diagnostic evaluation of this patient and formulated the key components of the plan.  Additionally, I have personally reviewed laboratory data, imaging studies, as well as relevant notes and concur with the physician assistant's documentation above.  Meredith Staggers, MD, Mellody Drown    Bary Leriche, PA-C 02/10/2018

## 2018-02-10 NOTE — Progress Notes (Addendum)
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    PT Comments    Pt making steady progress. Continue to recommend ST-SNF prior to return home.    Follow Up Recommendations  CIR     Equipment Recommendations  Other (comment)(To be determined)    Recommendations for Other Services       Precautions / Restrictions Precautions Precautions: Fall Restrictions Weight Bearing Restrictions: No    Mobility  Bed Mobility Overal bed mobility: Needs Assistance Bed Mobility: Sit to Supine       Sit to supine: Min assist   General bed mobility comments: Assist to bring LLE up into bed  Transfers Overall transfer level: Needs assistance Equipment used: Ambulation equipment used Transfers: Sit to/from Stand Sit to Stand: +2 physical assistance;Mod assist        Lateral/Scoot Transfers: Min guard General transfer comment: Assist to bring hips up and for balance. Stood x 2 for ~20 sec. Used Stedy to go from [employer] to bed  Ambulation/Gait                 Marine scientist Rankin (Stroke Patients Only)       Balance Overall balance assessment: Needs assistance Sitting-balance support: No upper extremity supported;Feet supported Sitting balance-Leahy Scale: Fair     Standing balance support: Bilateral upper extremity supported Standing balance-Leahy Scale: Poor Standing balance comment: Stedy and +2 min assist for static standing x 20 sec                            Cognition Arousal/Alertness:  Awake/alert Behavior During Therapy: WFL for tasks assessed/performed Overall Cognitive Status: Within Functional Limits for tasks assessed                                        Exercises Amputee Exercises Hip ABduction/ADduction: AROM;Right;5 reps;Supine Hip Flexion/Marching: AROM;Right;5 reps;Supine    General Comments        Pertinent Vitals/Pain Pain Assessment: Faces Faces Pain Scale: Hurts little more Pain Location: R residual limb Pain Descriptors / Indicators: Sore Pain Intervention(s): Monitored during session;Repositioned    Home Living                      Prior Function            PT Goals (current goals can now be found in the care plan section) Acute Rehab PT Goals Patient Stated Goal: go to rehab the go home Progress towards PT goals: Progressing toward goals    Frequency    Min 3X/week      PT Plan Current plan remains appropriate    Co-evaluation              AM-PAC PT "6 Clicks" Mobility   Outcome Measure  Help needed turning from your back to your side while in a flat bed without using bedrails?: A Little Help needed moving from lying on your back to sitting  on the side of a flat bed without using bedrails?: A Little Help needed moving to and from a bed to a chair (including a wheelchair)?: A Little Help needed standing up from a chair using your arms (e.g., wheelchair or bedside chair)?: A Lot Help needed to walk in hospital room?: Total Help needed climbing 3-5 steps with a railing? : Total 6 Click Score: 13    End of Session Equipment Utilized During Treatment: Gait belt Activity Tolerance: Patient tolerated treatment well Patient left: in bed;with call bell/phone within reach;with family/visitor present Nurse Communication: Mobility status;Need for lift equipment PT Visit Diagnosis: Difficulty in walking, not elsewhere classified (R26.2);Pain Pain - Right/Left: Right Pain - part of body: Leg      Time: 1208-1224 PT Time Calculation (min) (ACUTE ONLY): 16 min  Charges:  $Therapeutic Activity: 8-22 mins                     [place] Pager [phone] Office [place] 02/10/2018, 1:23 PM

## 2018-02-10 NOTE — PMR Pre-admission (Signed)
PMR Admission Coordinator Pre-Admission Assessment  Patient: [name] is an 78 y.o., male MRN: [id number] DOB: 1940/05/11 Height: 5' 6.93" (170 cm) Weight: 74.5 kg              Insurance Information HMO: No    PPO:       PCP:       IPA:       80/20:       OTHER:   PRIMARY: Medicare A and B      Policy#: [id number]      Subscriber: Patient CM Name:        Phone#:       Fax#:   Pre-Cert#:        Employer: Retired Benefits:  Phone #:       Name: Checked in [place] [employer] Date: 04/10/03     Deduct: $1408      Out of Pocket Max: none      Life Max: N/A CIR: 100%      SNF: 100 days Outpatient: 80%     Co-Pay: 20% Home Health: 100%      Co-Pay: none DME: 80%     Co-Pay: 20% Providers: patient's choice  SECONDARY: [employer] Choice care      Policy#: [id number]      Subscriber: patient CM Name:        Phone#:       Fax#:   Pre-Cert#:        Employer: Retired Benefits:  Phone #: [phone]     Name:   Eff. 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 He underwent R-AKA by Dr. Scot Dock on 02/07/18. Post surgery, pain slowly improving and stump sock ordered by [employer]. Therapy ongoing and CIR recommended due to functional deficits.    Past Medical History  Past Medical History:  Diagnosis Date  . Bruises easily    d/t being on Eliquis  . Carotid artery disease ([place])    a. Duplex 05/2014: 53-29% RICA, 9-24% LICA, elevated velocities in right subclavian artery, normal left subclavian artery..  . Chronic back pain    HNP  . Chronic kidney disease (CKD)   . Claustrophobia    takes Ativan as needed  . 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 Coronary atherosclerosis of unspecified type of vessel, native or graft    a. cath in 2003 showed 10-20% LM, scattered 20% prox-mid LAD, 30% more distal LAD, 50-60% stenosis of LAD  towards apex, 20% Cx, 30% dRCA, focal 95% stenosis in smaller of 2 branches of PDA, EF 60-65%.  . Diabetes mellitus ([place])   . Diarrhea    takes Imodium daily as needed  . Diverticulosis   . Diverticulosis of colon (without mention of hemorrhage)   . DJD (degenerative joint disease)   . DVT (deep venous thrombosis) ([place]) 07/2011  . Esophageal stricture   . Essential hypertension    takes Imdur and Lisinopril daily  . Gastric polyps    benign  . GERD (gastroesophageal reflux disease)    takes Nexium daily  . Glaucoma    uses eye drops  . History of bronchitis    not sure when the last time  . History of colon polyps    benign  . History of kidney stones   . History of kidney stones   . Insomnia    takes Melatonin nightly as needed  . Mixed hyperlipidemia    takes Crestor daily  . Myasthenia gravis ([place])    uses prednisone  . Osteoporosis 2016  . Peripheral edema    takes Lasix daily  . Pituitary tumor    [facility]Walden at [facility] checks it every Jan   . Pneumonia 01/2016  . PONV (postoperative nausea and vomiting)   . Ptosis   . Pulmonary embolus ([place]) 2012  . Stroke [facility]) 06/2015    Family History  family history includes Breast cancer in his sister; Clotting disorder in his sister; Dementia in his sister; Heart attack in his father, mother, sister, sister, and sister; Heart disease in his father, mother, and sister; Stroke in his father and  mother.  Prior Rehab/Hospitalizations: Was on CIR in 2019.  Had [facility] therapies through [facility].  Was hospitalized in 09/19 for shoulder infection and need for PICC line and antibiotics.  Has the patient had major surgery during 100 days prior to admission? Yes.  He had stents placed in Oct-Nov 2019 per patient and wife.  Current Medications   Current Facility-Administered Medications:  .  acetaminophen (TYLENOL) tablet 325-650 mg, 325-650 mg, Oral, Q4H PRN, 325 mg at 02/08/18 0904 **OR** acetaminophen (TYLENOL) suppository 325-650 mg, 325-650 mg, Rectal, Q4H PRN, [name], MD .  alum & mag hydroxide-simeth (MAALOX/MYLANTA) 200-200-20 MG/5ML suspension 15-30 mL, 15-30 mL, Oral, Q2H PRN, [name], MD .  apixaban Arne [place]) tablet 5 mg, 5 mg, Oral, BID, Samuella Cota, MD, 5 mg at 02/10/18 1127 .  brimonidine (ALPHAGAN) 0.2 % ophthalmic solution 1 drop, 1 drop, Both Eyes, BID, [name], MD, 1 drop at 02/10/18 [phone] .  cholecalciferol (VITAMIN D3) tablet 1,000 Units, 1,000 Units, Oral, Daily, [name], MD, 1,000 Units at 02/10/18 [phone] .  [START ON 02/11/2018] clopidogrel (PLAVIX) tablet 75 mg, 75 mg, Oral, Daily, Samuella Cota, MD .  furosemide (LASIX) tablet 20 mg, 20 mg, Oral, Daily, [name], MD, 20 mg at 02/10/18 2706 .  guaiFENesin-dextromethorphan (ROBITUSSIN DM) 100-10 MG/5ML syrup 15 mL, 15 mL, Oral, Q4H PRN, [name], MD .  hydrALAZINE (APRESOLINE) injection 5 mg, 5 mg, Intravenous, Q20 Min PRN, [name], MD .  imatinib (GLEEVEC) tablet 300 mg, 300 mg, Oral, Q lunch, [name], MD, 300 mg at 02/10/18 1125 .  insulin aspart (novoLOG) injection 0-9 Units, 0-9 Units, Subcutaneous, TID WC, [name], MD, 1 Units at 02/09/18 1631 .  isosorbide mononitrate (IMDUR) 24 hr tablet 30 mg, 30 mg, Oral, Daily, [name],  MD, 30 mg at 02/10/18 0808 .  lactated ringers  infusion, , Intravenous, Continuous, [name], MD, Last Rate: 10 mL/hr at 02/07/18 1255 .  metoprolol tartrate (LOPRESSOR) injection 2-5 mg, 2-5 mg, Intravenous, Q2H PRN, [name], MD .  multivitamin with minerals tablet 1 tablet, 1 tablet, Oral, Daily, [name], MD, 1 tablet at 02/10/18 [phone] .  ondansetron (ZOFRAN) injection 4 mg, 4 mg, Intravenous, Q6H PRN, [name], MD .  oxyCODONE-acetaminophen (PERCOCET/ROXICET) 5-325 MG per tablet 1-2 tablet, 1-2 tablet, Oral, Q4H PRN, Samuella Cota, MD, 2 tablet at 02/10/18 1037 .  pantoprazole (PROTONIX) EC tablet 40 mg, 40 mg, Oral, Daily, [name], MD, 40 mg at 02/10/18 5188 .  phenol (CHLORASEPTIC) mouth spray 1 spray, 1 spray, Mouth/Throat, PRN, [name], MD .  polyethylene glycol (MIRALAX / GLYCOLAX) packet 17 g, 17 g, Oral, BID, Samuella Cota, MD, 17 g at 02/09/18 0844 .  predniSONE (DELTASONE) tablet 3 mg, 3 mg, Oral, Q breakfast, [name], MD, 3 mg at 02/10/18 4166 .  pyridostigmine (MESTINON) tablet 60 mg, 60 mg, Oral, Daily, [name], MD, 60 mg at 02/10/18 0630 .  rosuvastatin (CRESTOR) tablet 5 mg, 5 mg, Oral, Daily, [name], MD, 5 mg at 02/10/18 1601 .  senna-docusate (Senokot-S) tablet 1 tablet, 1 tablet, Oral, QHS PRN, [name], MD .  timolol (TIMOPTIC) 0.5 % ophthalmic solution 1 drop, 1 drop, Both Eyes, BID, [name], MD, 1 drop at 02/10/18 0809 .  traZODone (DESYREL) tablet 50 mg, 50 mg, Oral, QHS, Samuella Cota, MD  Patients Current Diet:  Diet Order            Diet heart healthy/carb modified Room service appropriate? Yes; Fluid consistency: Thin  Diet effective now              Precautions / Restrictions Precautions Precautions: Fall Restrictions Weight Bearing Restrictions: No   Has the patient had 2 or more falls or a fall with injury in the past year?Yes.   Patient and wife report 2 falls this past year.  Prior Activity Level Community (5-7x/wk): Went out daily, was driving up until pain prevented him from driving.  Home Assistive Devices / Equipment Home Assistive Devices/Equipment: Environmental consultant (specify type) Home Equipment: Shower seat - built in, [place] - 2 wheels, [place] - single point  Prior Device Use: Indicate devices/aids used by the patient prior to current illness, exacerbation or injury? Walker and [employer]  Prior Functional Level Prior Function Level of Independence: Needs Licensed conveyancer / Transfers Assistance Needed: pt used RW ADL's / Homemaking Assistance Needed:  assistance for LB dressing  Comments: Enjoys spending time with family  Self Care: Did the patient need help bathing, dressing, using the toilet or eating?  Independent  Indoor Mobility: Did the patient need assistance with walking from room to room (with or without device)? Independent  Stairs: Did the patient need assistance with internal or external stairs (with or without device)? Independent  Functional Cognition: Did the patient need help planning regular tasks such as shopping or remembering to take medications? Independent  Current Functional Level Cognition  Overall Cognitive Status: Within Functional Limits for tasks assessed Orientation Level: Oriented X4    Extremity Assessment (includes Sensation/Coordination)  Upper Extremity Assessment: Overall WFL for tasks assessed  Lower Extremity Assessment: RLE deficits/detail RLE Deficits / Details: right AKA    ADLs  Overall ADL's : Needs assistance/impaired Eating/Feeding: Independent,  Sitting Grooming: Set up, Sitting, Oral care Upper Body Bathing: Min guard, Sitting Lower Body Bathing: Moderate assistance, Sitting/lateral leans Upper Body Dressing : Sitting Lower Body Dressing: Maximal assistance, Sitting/lateral leans Toilet Transfer: Moderate assistance, +2 for physical assistance, Cueing for  safety, RW, Stand-pivot, BSC Toileting- Clothing Manipulation and Hygiene: Moderate assistance, Sitting/lateral lean Functional mobility during ADLs: Rolling walker, Moderate assistance, +2 for safety/equipment General ADL Comments: educated in LB ADL and pericare leaning side to side in sitting    Mobility  Overal bed mobility: Needs Assistance Bed Mobility: Sit to Supine Supine to sit: Min assist Sit to supine: Min assist General bed mobility comments: Assist to bring LLE up into bed    Transfers  Overall transfer level: Needs assistance Equipment used: Ambulation equipment used Transfers: Sit to/from Stand Sit to Stand: +2 physical assistance, Mod assist Stand pivot transfers: Mod assist, +2 physical assistance  Lateral/Scoot Transfers: Min guard General transfer comment: Assist to bring hips up and for balance. Stood x 2 for ~20 sec. Used Stedy to go from [employer] to bed    Ambulation / Gait / Stairs / Office manager / Balance Balance Overall balance assessment: Needs assistance Sitting-balance support: No upper extremity supported, Feet supported Sitting balance-Leahy Scale: Fair Standing balance support: Bilateral upper extremity supported Standing balance-Leahy Scale: Poor Standing balance comment: Stedy and +2 min assist for static standing x 20 sec    Special needs/care consideration BiPAP/CPAP No CPM No Continuous Drip IV No Dialysis No         Life Vest No Oxygen No Special Bed No Trach Size No Wound Vac (area) No       Skin R AKA stapled incision with dressing                              Bowel mgmt: Last BM 02/10/18 Bladder mgmt: Voiding in urinal Diabetic mgmt No    Previous Home Environment Living Arrangements: Spouse/significant other Available Help at Discharge: Family, Available 24 hours/day Type of Home: House Home Layout: Multi-level Alternate Level Stairs-Rails: Left Alternate Level Stairs-Number of Steps: 9 Home Access:  Stairs to enter Entrance Stairs-Rails: Left Entrance Stairs-Number of Steps: 3 from porch, then 9 to first floor  Bathroom Shower/Tub: Multimedia programmer: Handicapped height Home Care Services: No Additional Comments: Walk-in shower with built in bench  Discharge Living Setting Plans for Discharge Living Setting: Patient's home, House, Lives with (comment)(Lives with wife.) Type of Home at Discharge: House Discharge Home Layout: Two level(Split level home) Alternate Level Stairs-Number of Steps: 6 steps up and then 6 steps down to alternate level. Discharge Home Access: Stairs to enter Entrance Stairs-Number of Steps: 3 step entry Discharge Bathroom Shower/Tub: Walk-in shower, Curtain Discharge Bathroom Toilet: Handicapped height Discharge Bathroom Accessibility: Yes How Accessible: Accessible via walker Does the patient have any problems obtaining your medications?: No  Social/Family/Support Systems Patient Roles: Spouse, Parent(Has a wife, a son and a daughter.) Contact Information: Cloud Graham - spouse Anticipated Caregiver: wife Anticipated Caregiver's Contact Information: Hassan Rowan - wife - [phone] Ability/Limitations of Caregiver: Wife is retired and can assist. Careers adviser: 24/7 Discharge Plan Discussed with Primary Caregiver: Yes Is Caregiver In Agreement with Plan?: Yes Does Caregiver/Family have Issues with Lodging/Transportation while Pt is in Rehab?: No  Goals/Additional Needs Patient/Family Goal for Rehab: PT/OT mod I and supervision goals Expected length of stay: 7-10 days Cultural Considerations: Attends [employer] Dietary Needs: Heart Healthy, carb mod, thin liquids Equipment Needs: TBD Pt/Family Agrees to Admission and willing to participate: Yes Program Orientation Provided & Reviewed with Pt/Caregiver Including Roles  & Responsibilities: Yes  Decrease burden of Care through IP rehab admission: N/A  Possible need for  SNF placement upon discharge: Not planned  Patient Condition: This patient's medical and functional status has changed since the consult dated: 02/08/18 in which the Rehabilitation Physician determined and documented that the patient's condition is appropriate for intensive rehabilitative care in an inpatient rehabilitation facility. See "History of Present Illness" (above) for medical update. Functional changes are:  Currently requiring mod assist +2 for transfers. Patient's medical and functional status update has been discussed with the Rehabilitation physician and patient remains appropriate for inpatient rehabilitation. Will admit to inpatient rehab today.  Preadmission Screen Completed By:  Retta Diones, 02/10/2018 3:02 PM ______________________________________________________________________   Discussed status with Dr. Naaman Plummer on 1/2/20at  1501 and received telephone approval for admission today.  Admission Coordinator:  Retta Diones, time 1501/Date 02/10/18

## 2018-02-10 NOTE — Progress Notes (Signed)
To rehab unit alert and oriented patient Oriented to unit set up. Fall precaution discussed and signed by patient.

## 2018-02-11 ENCOUNTER — Inpatient Hospital Stay (HOSPITAL_COMMUNITY): Payer: No Typology Code available for payment source | Admitting: Physical Therapy

## 2018-02-11 ENCOUNTER — Inpatient Hospital Stay (HOSPITAL_COMMUNITY): Payer: No Typology Code available for payment source

## 2018-02-11 ENCOUNTER — Other Ambulatory Visit: Payer: Self-pay

## 2018-02-11 ENCOUNTER — Inpatient Hospital Stay (HOSPITAL_COMMUNITY): Payer: No Typology Code available for payment source | Admitting: Occupational Therapy

## 2018-02-11 DIAGNOSIS — E876 Hypokalemia: Secondary | ICD-10-CM

## 2018-02-11 DIAGNOSIS — I10 Essential (primary) hypertension: Secondary | ICD-10-CM

## 2018-02-11 DIAGNOSIS — R350 Frequency of micturition: Secondary | ICD-10-CM

## 2018-02-11 LAB — GLUCOSE, CAPILLARY
Glucose-Capillary: 86 mg/dL (ref 70–99)
Glucose-Capillary: 106 mg/dL — ABNORMAL HIGH (ref 70–99)
Glucose-Capillary: 135 mg/dL — ABNORMAL HIGH (ref 70–99)
Glucose-Capillary: 123 mg/dL — ABNORMAL HIGH (ref 70–99)

## 2018-02-11 LAB — CBC WITH DIFFERENTIAL/PLATELET
Abs Immature Granulocytes: 0.05 10*3/uL (ref 0.00–0.07)
Basophils Absolute: 0 10*3/uL (ref 0.0–0.1)
Basophils Relative: 0 %
Eosinophils Absolute: 0.1 10*3/uL (ref 0.0–0.5)
Eosinophils Relative: 1 %
HEMATOCRIT: 28 % — AB (ref 39.0–52.0)
Hemoglobin: 9.2 g/dL — ABNORMAL LOW (ref 13.0–17.0)
IMMATURE GRANULOCYTES: 1 %
LYMPHS ABS: 1.7 10*3/uL (ref 0.7–4.0)
LYMPHS PCT: 19 %
MCH: 31.1 pg (ref 26.0–34.0)
MCHC: 32.9 g/dL (ref 30.0–36.0)
MCV: 94.6 fL (ref 80.0–100.0)
Monocytes Absolute: 1.2 10*3/uL — ABNORMAL HIGH (ref 0.1–1.0)
Monocytes Relative: 13 %
Neutro Abs: 5.9 10*3/uL (ref 1.7–7.7)
Neutrophils Relative %: 66 %
Platelets: 261 10*3/uL (ref 150–400)
RBC: 2.96 MIL/uL — ABNORMAL LOW (ref 4.22–5.81)
RDW: 14.6 % (ref 11.5–15.5)
WBC: 8.8 10*3/uL (ref 4.0–10.5)
nRBC: 0 % (ref 0.0–0.2)

## 2018-02-11 LAB — URINALYSIS, COMPLETE (UACMP) WITH MICROSCOPIC
Bilirubin Urine: NEGATIVE
Glucose, UA: NEGATIVE mg/dL
Hgb urine dipstick: NEGATIVE
Ketones, ur: NEGATIVE mg/dL
Leukocytes, UA: NEGATIVE
Nitrite: NEGATIVE
Protein, ur: NEGATIVE mg/dL
Specific Gravity, Urine: 1.006 (ref 1.005–1.030)
pH: 7 (ref 5.0–8.0)

## 2018-02-11 LAB — COMPREHENSIVE METABOLIC PANEL
ALT: 19 U/L (ref 0–44)
AST: 39 U/L (ref 15–41)
Albumin: 2.6 g/dL — ABNORMAL LOW (ref 3.5–5.0)
Alkaline Phosphatase: 41 U/L (ref 38–126)
Anion gap: 8 (ref 5–15)
BUN: 14 mg/dL (ref 8–23)
CO2: 25 mmol/L (ref 22–32)
Calcium: 8.4 mg/dL — ABNORMAL LOW (ref 8.9–10.3)
Chloride: 105 mmol/L (ref 98–111)
Creatinine, Ser: 1.62 mg/dL — ABNORMAL HIGH (ref 0.61–1.24)
GFR calc Af Amer: 46 mL/min — ABNORMAL LOW (ref >60)
GFR calc non Af Amer: 40 mL/min — ABNORMAL LOW (ref >60)
Glucose, Bld: 97 mg/dL (ref 70–99)
Potassium: 3.4 mmol/L — ABNORMAL LOW (ref 3.5–5.1)
Sodium: 138 mmol/L (ref 135–145)
Total Bilirubin: 0.5 mg/dL (ref 0.3–1.2)
Total Protein: 4.7 g/dL — ABNORMAL LOW (ref 6.5–8.1)

## 2018-02-11 NOTE — Progress Notes (Signed)
Jamse Arn, MD  Physician  Physical Medicine and Rehabilitation  Consult Note  Signed  Date of Service:  02/08/2018 5:37 AM       Related encounter: ED to Hosp-Admission (Discharged) from 02/05/2018 in [facility] 4E CV SURGICAL PROGRESSIVE CARE      Signed      Expand All Collapse All    Show:Clear all [x] Manual[x] Template[] Copied  Added by: [x] Angiulli, Lavon Paganini, PA-C[x] Posey Pronto, Domenick Bookbinder, MD  [] Hover for details      Physical Medicine and Rehabilitation Consult Reason for Consult:  Decreased functional mobility Referring Physician: [facility]   HPI: [name] is a 78 y.o.right handed male with 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 Per chart review, patient, and wife, patient lives with wife in [place]. Two-level home with bedroom upstairs and 3 steps to entry. Rather sedentary prior to admission. Wife can provide supervision at discharge. Presented 02/05/2018 with ischemic right lower extremity due to advanced peripheral vascular disease. No change with conservative care. 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 [name]. Hospital course pain management. Therapy evaluations pending. M.D. has requested physical medicine rehabilitation consult.  Review of Systems  Constitutional: Negative for chills and fever.  HENT: Negative for hearing loss.   Eyes: Negative for blurred vision and double vision.  Respiratory: Positive for shortness of breath. Negative for cough.   Cardiovascular: Positive for leg swelling. Negative for chest pain and palpitations.  Gastrointestinal: Positive for diarrhea. Negative for nausea and vomiting.       GERD  Genitourinary: Positive for urgency. Negative for flank pain and  hematuria.  Musculoskeletal: Positive for back pain and myalgias.  Skin: Negative for rash.  All other systems reviewed and are negative.      Past Medical History:  Diagnosis Date  . Bruises easily    d/t being on Eliquis  . Carotid artery disease ([place])    a. Duplex 05/2014: 27-25% RICA, 3-66% LICA, elevated velocities in right subclavian artery, normal left subclavian artery..  . Chronic back pain    HNP  . Chronic kidney disease (CKD)   . Claustrophobia    takes Ativan as needed  . 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 Coronary atherosclerosis of unspecified type of vessel, native or graft    a. cath in 2003 showed 10-20% LM, scattered 20% prox-mid LAD, 30% more distal LAD, 50-60% stenosis of LAD towards apex, 20% Cx, 30% dRCA, focal 95% stenosis in smaller of 2 branches of PDA, EF 60-65%.  . Diabetes mellitus ([place])   . Diarrhea    takes Imodium daily as needed  . Diverticulosis   . Diverticulosis of colon (without mention of hemorrhage)   . DJD (degenerative joint disease)   . DVT (deep venous thrombosis) ([place]) 07/2011  . Esophageal stricture   . Essential hypertension    takes Imdur and Lisinopril daily  . Gastric polyps    benign  . GERD (gastroesophageal reflux disease)    takes Nexium daily  . Glaucoma    uses eye drops  . History of bronchitis    not sure when the last time  . History of colon polyps    benign  . History of kidney stones   . History of kidney stones   . Insomnia  takes Melatonin nightly as needed  . Mixed hyperlipidemia    takes Crestor daily  . Myasthenia gravis ([place])    uses prednisone  . Osteoporosis 2016  . Peripheral edema    takes Lasix daily  . Pituitary tumor    checks Dr[facility]Walden at [facility] checks it every Jan   . Pneumonia 01/2016  . PONV (postoperative nausea and vomiting)   . Ptosis   . Pulmonary embolus ([place]) 2012  . Stroke [facility]) 06/2015          Past Surgical History:  Procedure Laterality Date  . ABDOMINAL AORTOGRAM W/LOWER EXTREMITY N/A 11/18/2016   Procedure: ABDOMINAL AORTOGRAM W/LOWER EXTREMITY;  Surgeon: [name], MD;  Location: [place] CV LAB;  Service: Cardiovascular;  Laterality: N/A;  . AORTOGRAM Right 12/23/2017   Procedure: AORTOGRAM, RIGHT LOWER EXTREMITY ANGIOGRAM, RIGHT SUPERFICIAL FEMORAL ARTERY ANGIOPLASTY WITH STENT, RIGHT POPLITEAL ARTERY ANGIOPLASTY WITH STENT, LEFT GROIN CUTDOWN AND LEFT FEMORAL ARTERY REPAIR;  Surgeon: [name], MD;  Location: [place];  Service: Vascular;  Laterality: Right;  . BACK SURGERY     x2, lumbar and cervical  . CARDIAC CATHETERIZATION    . CATARACT EXTRACTION Bilateral 12/12  . COLONOSCOPY    . ESOPHAGOGASTRODUODENOSCOPY ENDOSCOPY  04/2015  . IR GENERIC HISTORICAL  03/03/2016   IR IVC FILTER PLMT / S&I [name] GUID/MOD SED 03/03/2016 [name], MD [facility]INTER[facility] RAD  . IR GENERIC HISTORICAL  04/09/2016   IR RADIOLOGIST EVAL & MGMT 04/09/2016 [name], MD [facility] INTER[facility] RAD  . IR GENERIC HISTORICAL  04/30/2016   IR IVC FILTER RETRIEVAL / S&I [name] GUID/MOD SED 04/30/2016 [name], MD [facility]-INTERV RAD  . LITHOTRIPSY    . LUMBAR LAMINECTOMY/DECOMPRESSION MICRODISCECTOMY Right 09/14/2012   Procedure: Right Lumbar three-four, four-five, Lumbar five-Sacral one decompressive laminectomy;  Surgeon: [name], MD;  Location: [place] NEURO ORS;  Service: Neurosurgery;  Laterality: Right;  . LUMBAR LAMINECTOMY/DECOMPRESSION MICRODISCECTOMY Left 03/11/2016   Procedure: Left Lumbar Two-ThreeMicrodiscectomy;  Surgeon: [name], MD;  Location: [place];  Service: Neurosurgery;  Laterality: Left;  . PERIPHERAL VASCULAR INTERVENTION  11/18/2016   Procedure: PERIPHERAL VASCULAR INTERVENTION;  Surgeon: [name], MD;  Location: [place] CV LAB;  Service: Cardiovascular;;  Left popliteal  . SHOULDER SURGERY Left 05/07/2009  . TONSILLECTOMY           Family History  Problem Relation Age of Onset  . Heart disease Mother   . Heart attack Mother   . Stroke Mother   . Heart disease Father   . Heart attack Father   . Stroke Father   . Breast cancer Sister        Twin   . Heart attack Sister   . Dementia Sister   . Heart disease Sister   . Heart attack Sister   . Clotting disorder Sister   . Heart attack Sister   . Colon cancer Neg Hx    Social History:  reports that he has never smoked. He has never used smokeless tobacco. He reports that he does not drink alcohol or use drugs. Allergies:       Allergies  Allergen Reactions  . Other Other (See Comments)    Regarding scans, the PATIENT IS CLAUSTROPHOBIC!!!  . Phenergan [Promethazine Hcl] Swelling and Other (See Comments)    Cannot be combined with Zofran because swelling of the eyes resulted and patient became very restless-  . Zofran [Ondansetron Hcl] Swelling and Other (See Comments)    Cannot  be combined with Phenergan because swelling of the eyes resulted and patient became very restless-  . Sulfamethoxazole Rash and Itching         Medications Prior to Admission  Medication Sig Dispense Refill  . brimonidine-timolol (COMBIGAN) 0.2-0.5 % ophthalmic solution Place 1 drop into both eyes 2 (two) times daily.     . cholecalciferol (VITAMIN D) 1000 units tablet Take 1,000 Units by mouth daily.     . clopidogrel (PLAVIX) 75 MG tablet Take 1 tablet (75 mg total) by mouth daily. 30 tablet 6  . ELIQUIS 5 MG TABS tablet TAKE 1 TABLET (5 MG TOTAL) BY MOUTH 2 (TWO) TIMES DAILY. 60 tablet 3  . furosemide (LASIX) 40 MG tablet Take 1 tablet (40 mg total) by mouth daily. 90 tablet 3  . imatinib (GLEEVEC) 100 MG tablet Take 300 mg by mouth daily with lunch. Take with noon meal and a large glass of water.Caution:Chemotherapy    . isosorbide mononitrate (IMDUR) 30 MG 24 hr tablet Take 1 tablet (30 mg total) by mouth daily. 90 tablet 3  . lidocaine (LIDODERM) 5 %  Place 1 patch onto the skin daily as needed (for back pain). Remove & Discard patch within 12 hours or as directed by MD     . lisinopril (PRINIVIL,ZESTRIL) 10 MG tablet Take 1 tablet (10 mg total) by mouth daily. 30 tablet 11  . loperamide (IMODIUM) 2 MG capsule Take 2-4 mg by mouth 3 (three) times daily as needed for diarrhea or loose stools.     . Multiple Vitamin (MULTIVITAMIN) tablet Take 1 tablet by mouth daily.    Marland Kitchen oxyCODONE (OXY IR/ROXICODONE) 5 MG immediate release tablet Take 1 tablet (5 mg total) by mouth every 6 (six) hours as needed for breakthrough pain. 25 tablet 0  . potassium chloride (K-DUR) 10 MEQ tablet Take 1 tablet (10 mEq total) by mouth daily. 90 tablet 3  . predniSONE (DELTASONE) 1 MG tablet TAKE 3 TABLETS BY MOUTH ONCE DAILY WITH BREAKFAST (Patient taking differently: Take 3 mg by mouth daily with breakfast. ) 270 tablet 0  . pyridostigmine (MESTINON) 60 MG tablet Take 60 mg by mouth daily.    . rosuvastatin (CRESTOR) 10 MG tablet Take 5 mg by mouth daily.    . traMADol (ULTRAM) 50 MG tablet Take 1 tablet (50 mg total) by mouth every 6 (six) hours as needed for moderate pain or severe pain. 15 tablet 0  . oxyCODONE-acetaminophen (PERCOCET/ROXICET) 5-325 MG tablet Take 1 tablet by mouth every 6 (six) hours as needed (for pain).   0    Home: Home Living Family/patient expects to be discharged to:: Private residence Living Arrangements: Spouse/significant other  Functional History: Functional Status:  Mobility:  ADL:  Cognition: Cognition Orientation Level: Oriented X4  Blood pressure 139/68, pulse 83, temperature 97.8 F (36.6 C), temperature source Oral, resp. rate 18, height 5' 6.93" (1.7 m), weight 74.5 kg, SpO2 100 %. Physical Exam  Vitals reviewed. Constitutional: He is oriented to person, place, and time. He appears well-developed and well-nourished.  HENT:  Head: Normocephalic and atraumatic.  Eyes: EOM are normal. Right eye exhibits  no discharge. Left eye exhibits no discharge.  Neck: Normal range of motion. Neck supple. No thyromegaly present.  Cardiovascular: Normal rate and regular rhythm.  Respiratory: Effort normal and breath sounds normal. No respiratory distress.  +[place]  GI: Soft. Bowel sounds are normal. He exhibits no distension.  Musculoskeletal:     Comments: Right AKA edema and extremely  tender  Neurological: He is alert and oriented to person, place, and time.  Motor: B/l UE: 5/5 proximal to distal LLE: 4/5 (pain inhibition) RLE: Unable to perform HF due to pain  Skin:  Right lower extremity amputation site is dressed appropriately tender  Psychiatric: He has a normal mood and affect. His behavior is normal.          Assessment/Plan: Diagnosis: Right AKA Labs independently reviewed.  Records reviewed and summated above. Clean amputation daily with soap and water Monitor incision site for signs of infection or impending skin breakdown. Staples to remain in place for 3-4 weeks Stump shrinker, for edema control  Scar mobilization massaging to prevent soft tissue adherence Stump protector during therapies Prevent flexion contractures by implementing the following:              Encourage prone lying for 20-30 mins per day BID to avoid hip flexion       Contractures if medically appropriate;             Avoid prolonged sitting Post surgical pain control with oral medication Phantom limb pain control with physical modalities including desensitization techniques (gentle self massage to the residual stump,hot packs if sensation intact, [place]) and mirror therapy, TENS. If ineffective, consider pharmacological treatment for neuropathic pain (e.g gabapentin, pregabalin, amytriptalyine, duloxetine).  Avoid injury to contralateral side  1. Does the need for close, 24 hr/day medical supervision in concert with the patient's rehab needs make it unreasonable for this patient to be served in a less intensive setting?  Potentially  2. Co-Morbidities requiring supervision/potential complications: post-op pain management (Biofeedback training with therapies to help reduce reliance on opiate pain medications, particularly IV Dilaudid, monitor pain control during therapies, and sedation at rest and titrate to maximum efficacy to ensure participation and gains in therapies), subcortical CVA 2019 (transition from heparin ggt to oral meds when appropriate), embolism of vertebral artery, pulmonary emboli, parotid tumor, myasthenia gravis (cont meds), CAD, CKD stage III (avoid nephrotoxic meds), CML., DM (Monitor in accordance with exercise and adjust meds as necessary), leukocytosis (repeat labs, cont to monitor for signs and symptoms of infection, further workup if indicated) 3. Due to safety, skin/wound care, disease management, pain management and patient education, does the patient require 24 hr/day rehab nursing? Yes 4. Does the patient require coordinated care of a physician, rehab nurse, PT (1-2 hrs/day, 5 days/week) and OT (1-2 hrs/day, 5 days/week) to address physical and functional deficits in the context of the above medical diagnosis(es)? Potentially Addressing deficits in the following areas: balance, endurance, locomotion, strength, transferring, bathing, dressing, toileting and psychosocial support 5. Can the patient actively participate in an intensive therapy program of at least 3 hrs of therapy per day at least 5 days per week? Potentially 6. The potential for patient to make measurable gains while on inpatient rehab is excellent 7. Anticipated functional outcomes upon discharge from inpatient rehab are TBD  with PT, TBD with OT, n/a with SLP. 8. Estimated rehab length of stay to reach the above functional goals is: TBD. 9. Anticipated D/C setting: Home 10. Anticipated post D/C treatments: HH therapy and Home excercise program 11. Overall Rehab/Functional Prognosis: good  RECOMMENDATIONS: This patient's  condition is appropriate for continued rehabilitative care in the following setting: Will await completion of therapy evals.  Likely CIR when able to tolerate. Patient has agreed to participate in recommended program. Yes Note that insurance prior authorization may be required for reimbursement for recommended care.  Comment: Rehab  Admissions Coordinator to follow up.   I have personally performed a face to face diagnostic evaluation, including, but not limited to relevant history and physical exam findings, of this patient and developed relevant assessment and plan.  Additionally, I have reviewed and concur with the physician assistant's documentation above.   Delice Lesch, MD, ABPMR Lavon Paganini Angiulli, PA-C 02/08/2018        Revision History                   Routing History

## 2018-02-11 NOTE — Progress Notes (Signed)
UA C&S collected as ordered via clean catch and sent to Lab for screening.

## 2018-02-11 NOTE — Progress Notes (Signed)
Per nursing, patient was given "Data Collection Information Summary for Patients in Inpatient Rehabilitation Facilities with attached Privacy Act Statement Health Care Records" upon admission.    Patient information reviewed and entered into eRehab System by Becky Talulah Schirmer, PPS coordinator. Information including medical coding, function ability, and quality indicators will be reviewed and updated through discharge.   

## 2018-02-11 NOTE — Evaluation (Addendum)
Occupational Therapy Assessment and Plan  Patient Details  Name: [name] MRN: [id number] Date of Birth: Jul 03, 1940  OT Diagnosis: acute pain, muscle weakness (generalized) and swelling of limb Rehab Potential: Rehab Potential (ACUTE ONLY): Excellent ELOS: 7-10 days   Today's Date: 02/11/2018 OT Individual Time:  - 2542-7062 Individual Treatment Time Calculation: 65 min      Problem List:  Patient Active Problem List   Diagnosis Date Noted  . 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 Acute blood loss anemia 02/08/2018  . Post-operative pain   . Unilateral AKA, right ([place])   . Leukocytosis   . Ischemic foot 02/05/2018  . Ischemia of right lower extremity   . Pressure injury of skin 12/27/2017  . Arterial occlusive disease   . Postoperative pain   . CKD (chronic kidney disease), stage III ([place])   . History of CVA (cerebrovascular accident)   . 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 Septic arthritis of left sternoclavicular joint ([place]) 11/03/2017  . Cervical spinal cord compression ([place]) 10/30/2017  . Nasal abscess 09/25/2017  . Immunocompromised ([place]) 09/25/2017  . Cellulitis of right axilla 09/25/2017  . Anemia 08/05/2017  . Transient ischemic attack (TIA) 07/23/2017  . Subcortical infarction ([place])   . Right hemiparesis ([place])   . Ataxia 07/21/2017  . TIA (transient ischemic attack)   . Thrombocytopenia ([place])   . Wheezing 06/17/2017  . Bursitis of left elbow 05/01/2017  . Dystonia 12/29/2016  . PAD (peripheral artery disease) ([place]) 11/18/2016  . Osteoporosis 08/28/2016  . Paronychia of great toe, left 08/10/2016  . Carotid arterial disease ([place]) 07/09/2016  . Hypocalcemia 07/09/2016  . Diplopia 07/02/2016  . S/P lumbar laminectomy 03/11/2016  . Radicular pain of left lower extremity 02/21/2016  . Cough 01/10/2016  . 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 Myasthenia gravis ([place])   . Diabetes mellitus type 2 in nonobese (HCC)   .  Coronary artery disease involving native coronary artery of native heart without angina pectoris   . Ataxia, post-stroke   . Gait disturbance, post-stroke   . Proximal leg weakness   . Cervical spondylosis without myelopathy   . Achilles tendinitis of right lower extremity   . Chronic diastolic CHF (congestive heart failure) ([place])   . Gastroesophageal reflux disease without esophagitis   . Cerebral infarction involving left cerebellar artery ([place]) 06/20/2015  . Cerebellar stroke ([place])   . Ischemic stroke ([place])   . Cerebrovascular accident (CVA) due to thrombosis of left vertebral artery ([place])   . History of pulmonary embolus (PE)   . Chronic anticoagulation   . HLD (hyperlipidemia)   . Neck pain 03/19/2015  . Pain in the chest   . 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 Chronic diastolic heart failure, NYHA class 1 ([place]) 03/07/2015  . Diabetic gastroparesis ([place]) 03/07/2015  . Screening for osteoporosis 07/19/2014  . Dyspnea 04/09/2014  . Edema 04/09/2014  . Routine general medical examination at a health care facility 10/23/2013  . Bilateral carotid bruits 05/24/2013  . Abnormal CT scan, lung 05/24/2013  . Encounter for therapeutic drug monitoring 03/14/2013  . Lymphadenopathy, inguinal 01/11/2013  . AKI (acute kidney injury) ([place]) 10/28/2012  . 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 Myasthenia gravis with exacerbation ([place]) 03/31/2012  . Disease of salivary gland 12/09/2011  . Left facial pain 10/28/2011  . Pulmonary embolism ([place]) 10/25/2011  . 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 GERD with stricture  09/23/2011  . Long term (current) use of anticoagulants 08/03/2011  . History of pulmonary embolism 07/30/2011  . History of DVT (deep vein thrombosis) 07/30/2011  . Subconjunctival hemorrhage 06/22/2011  . Bilateral conjunctivitis 04/09/2011  . Diabetes mellitus type 2, controlled ([place]) 12/18/2010  . Hearing loss 08/04/2010  . Ptosis  of eyelid 12/30/2009  . Essential hypertension 04/24/2009  . HYPERLIPIDEMIA, MIXED 10/05/2008  . HEMORRHOIDS-INTERNAL 07/02/2008  . HEMORRHOIDS-EXTERNAL 07/02/2008  . DIVERTICULOSIS-COLON 07/02/2008  . PERSONAL HX COLONIC POLYPS 07/02/2008  . RASH-NONVESICULAR 02/07/2008  . PNEUMONIA 12/08/2007  . 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 CAD (coronary artery disease) 07/07/2007  . NEPHROLITHIASIS, HX OF 07/07/2007    Past Medical History:  Past Medical History:  Diagnosis Date  . Bruises easily    d/t being on Eliquis  . Carotid artery disease ([place])    a. Duplex 05/2014: 91-69% RICA, 4-50% LICA, elevated velocities in right subclavian artery, normal left subclavian artery..  . Chronic back pain    HNP  . Chronic kidney disease (CKD)   . Claustrophobia    takes Ativan as needed  . 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 Coronary atherosclerosis of unspecified type of vessel, native or graft    a. cath in 2003 showed 10-20% LM, scattered 20% prox-mid LAD, 30% more distal LAD, 50-60% stenosis of LAD towards apex, 20% Cx, 30% dRCA, focal 95% stenosis in smaller of 2 branches of PDA, EF 60-65%.  . Diabetes mellitus ([place])   . Diarrhea    takes Imodium daily as needed  . Diverticulosis   . Diverticulosis of colon (without mention of hemorrhage)   . DJD (degenerative joint disease)   . DVT (deep venous thrombosis) ([place]) 07/2011  . Esophageal stricture   . Essential hypertension    takes Imdur and Lisinopril daily  . Gastric polyps    benign  . GERD (gastroesophageal reflux disease)    takes Nexium daily  . Glaucoma    uses eye drops  . History of bronchitis    not sure when the last time  . History of colon polyps    benign  . History of kidney stones   . History of kidney stones   . Insomnia    takes Melatonin nightly as needed  . Mixed hyperlipidemia    takes Crestor daily  . Myasthenia gravis ([place])    uses prednisone  .  Osteoporosis 2016  . Peripheral edema    takes Lasix daily  . Pituitary tumor    checks Dr[facility]Walden at [facility] checks it every Jan   . Pneumonia 01/2016  . PONV (postoperative nausea and vomiting)   . Ptosis   . Pulmonary embolus ([place]) 2012  . Stroke [facility]) 06/2015   Past Surgical History:  Past Surgical History:  Procedure Laterality Date  . ABDOMINAL AORTOGRAM W/LOWER EXTREMITY N/A 11/18/2016   Procedure: ABDOMINAL AORTOGRAM W/LOWER EXTREMITY;  Surgeon: [name], MD;  Location: [facility];  Service: Cardiovascular;  Laterality: N/A;  . AMPUTATION Right 02/07/2018   Procedure: right leg AMPUTATION ABOVE KNEE;  Surgeon: [name], MD;  Location: [facility];  Service: Vascular;  Laterality: Right;  . AORTOGRAM Right 12/23/2017   Procedure: AORTOGRAM, RIGHT LOWER EXTREMITY ANGIOGRAM, RIGHT SUPERFICIAL FEMORAL ARTERY ANGIOPLASTY WITH STENT, RIGHT POPLITEAL ARTERY ANGIOPLASTY WITH STENT, LEFT GROIN CUTDOWN AND LEFT FEMORAL ARTERY REPAIR;  Surgeon: [name], MD;  Location: [facility];  Service: Vascular;  Laterality:  Right;  Marland Kitchen BACK SURGERY     x2, lumbar and cervical  . CARDIAC CATHETERIZATION    . CATARACT EXTRACTION Bilateral 12/12  . COLONOSCOPY    . ESOPHAGOGASTRODUODENOSCOPY ENDOSCOPY  04/2015  . IR GENERIC HISTORICAL  03/03/2016   IR IVC FILTER PLMT / S&I [name] GUID/MOD SED 03/03/2016 [name], MD [facility]INTER[facility] RAD  . IR GENERIC HISTORICAL  04/09/2016   IR RADIOLOGIST EVAL & MGMT 04/09/2016 [name], MD [facility] INTER[facility] RAD  . IR GENERIC HISTORICAL  04/30/2016   IR IVC FILTER RETRIEVAL / S&I [name] GUID/MOD SED 04/30/2016 [name], MD [facility]-INTERV RAD  . LITHOTRIPSY    . LUMBAR LAMINECTOMY/DECOMPRESSION MICRODISCECTOMY Right 09/14/2012   Procedure: Right Lumbar three-four, four-five, Lumbar five-Sacral one decompressive laminectomy;  Surgeon: [name], MD;  Location: [facility];  Service: Neurosurgery;  Laterality: Right;  . LUMBAR  LAMINECTOMY/DECOMPRESSION MICRODISCECTOMY Left 03/11/2016   Procedure: Left Lumbar Two-ThreeMicrodiscectomy;  Surgeon: [name], MD;  Location: [facility];  Service: Neurosurgery;  Laterality: Left;  . PERIPHERAL VASCULAR INTERVENTION  11/18/2016   Procedure: PERIPHERAL VASCULAR INTERVENTION;  Surgeon: [name], MD;  Location: [facility];  Service: Cardiovascular;;  Left popliteal  . SHOULDER SURGERY Left 05/07/2009  . TONSILLECTOMY      Assessment & Plan Clinical Impression: Wray Goehring is a 78 year old male withhistory of CKD, CML- on Gleevec, MG- on Mestinon and chronic steroids, subcortical CVA with CIR stay 2019, PVD with ischemic changes RLE who was admitted on 02/05/18 for worsening of symptoms. He underwent R-AKA by Dr. Scot Dock on 02/07/18. Post surgery, pain slowly improving and stump sock ordered by [employer]. Therapy ongoing and CIR recommended due to functional deficits.  Patient currently requires min with basic self-care skills secondary to muscle weakness and limb loss, decreased cardiorespiratoy endurance and decreased balance strategies.  Prior to hospitalization, patient could complete BADLs with min.  Patient will benefit from skilled intervention to increase independence with basic self-care skills prior to discharge home with spouse Hassan Rowan.  Anticipate patient will require 24 hour supervision and follow up home health.  OT - End of Session Endurance Deficit: Yes Endurance Deficit Description: Required multiple rest breaks throughout ADL OT Assessment Rehab Potential (ACUTE ONLY): Excellent OT Barriers to Discharge: Inaccessible home environment;Home environment access/layout;Medical stability;Wound Care OT Patient demonstrates impairments in the following area(s): Balance;Edema;Safety;Pain;Endurance;Skin Integrity OT Basic ADL's Functional Problem(s): Dressing;Toileting;Grooming;Bathing OT Advanced ADL's Functional Problem(s): Light Housekeeping;Simple Meal  Preparation OT Transfers Functional Problem(s): Toilet;Tub/Shower OT Additional Impairment(s): None OT Plan OT Intensity: Minimum of 1-2 x/day, 45 to 90 minutes OT Frequency: 5 out of 7 days OT Duration/Estimated Length of Stay: 7-10 days OT Treatment/Interventions: [employer] reintegration;Discharge planning;Disease mangement/prevention;DME/adaptive equipment instruction;Functional mobility training;Pain management;Patient/family education;Psychosocial support;Self Care/advanced ADL retraining;Skin care/wound managment;Therapeutic Activities;Therapeutic Exercise;UE/LE Strength taining/ROM;UE/LE Coordination activities;Wheelchair propulsion/positioning OT Self Feeding Anticipated Outcome(s): No goal  OT Basic Self-Care Anticipated Outcome(s): Supervision/setup-Mod I  OT Toileting Anticipated Outcome(s): Supervision/setup  OT Bathroom Transfers Anticipated Outcome(s): Supervision/setup  OT Recommendation Recommendations for Other Services: Neuropsych consult Patient destination: Home Follow Up Recommendations: Home health OT Equipment Recommended: To be determined   Skilled Therapeutic Intervention Skilled OT session completed with focus on initial evaluation, education on OT role/POC, and establishment of patient-centered goals.   Pt completed bathing/dressing and grooming tasks during session. During B/D, he required cues for using lateral leans EOB, and adaptive strategies in supine. Pt able to manage Rt sock with Min A. RN in during session to change dressings and OT  initiated education regarding daily limb inspection during self care routine at home. He was able to wash buttocks and elevate pants over hips with cuing alone. Multiple rest breaks required to accommodate fatigue, with education provided for energy conservation at home. Min A lateral scoot<w/c and pt completed oral care and grooming tasks at sink with setup. At end of session pt requested to propel w/c out in hallway due to  claustrophobia. Set him up with safety belt fastened and notified RN/NT of his location.   OT Evaluation Precautions/Restrictions  Precautions Precautions: Fall Precaution Comments: Rt AKA General Chart Reviewed: Yes Family/Caregiver Present: No Pain Pt reported residual limb/phantom pain to be manageable with rest breaks during session.    Pain Assessment Pain Scale: 0-10 Pain Score: 4  Pain Type: Acute pain Pain Location: Leg Pain Orientation: Right Pain Descriptors / Indicators: Aching;Dull Pain Frequency: Constant Pain Onset: Gradual Patients Stated Pain Goal: 3 Pain Intervention(s): Emotional support Home Living/Prior Functioning Home Living Family/patient expects to be discharged to:: Private residence Living Arrangements: Spouse/significant other Available Help at Discharge: Family, Available 24 hours/day Type of Home: Mudlogger of Steps: Per pt, 3 to foyer, and then 6 more steps to main level Home Layout: Multi-level, Full bath on main level, Able to live on main level with bedroom/bathroom(main level + basement) Bathroom Shower/Tub: Walk-in shower(with built in Astronomer) Biochemist, clinical: Handicapped height Bathroom Accessibility: Yes Additional Comments: Pt reports home, including bathroom, can be w/c accessible after reorganized  Lives With: Spouse([name]) IADL History Occupation: Retired 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   Leisure and Hobbies: Marketing executive  Prior Function Level of Independence: Needs assistance with ADLs, Requires assistive device for independence Driving: Yes ADL ADL Eating: Not assessed Grooming: Setup Where Assessed-Grooming: Wheelchair, Sitting at sink Upper Body Bathing: Supervision/safety Where Assessed-Upper Body Bathing: Edge of bed Lower Body Bathing: Contact guard Where Assessed-Lower Body Bathing: Bed level, Edge of bed Upper Body Dressing: Setup Where Assessed-Upper Body Dressing: Edge of  bed Lower Body Dressing: Minimal assistance(for donning Rt shrinker) Where Assessed-Lower Body Dressing: Edge of bed Toileting: Not assessed Social research officer, government: Not assessed Vision Baseline Vision/History: Wears glasses Wears Glasses: Reading only Patient Visual Report: No change from baseline Vision Assessment?: Yes(Able to read staff names, room number/phone number on wall board, as well as wall clock while seated in w/c beside sink. Per pt, no visual changes since admission) Perception  Perception: Within Functional Limits Praxis Praxis: Intact Cognition Overall Cognitive Status: Within Functional Limits for tasks assessed Orientation Level: Place;Situation;Person Place: Oriented Situation: Oriented Year: 2020 Month: January Day of Week: Correct Memory: Appears intact Immediate Memory Recall: Sock;Blue;Bed Memory Recall: Sock;Blue;Bed Memory Recall Sock: Without Cue Memory Recall Blue: Without Cue Memory Recall Bed: Without Cue Attention: Sustained Sustained Attention: Appears intact Problem Solving: Appears intact Safety/Judgment: Appears intact Sensation Sensation Light Touch: Appears Intact(B UEs, and he reports no numbness/tingling in L LE or residual limb) Proprioception: Appears Intact Coordination Gross Motor Movements are Fluid and Coordinated: Yes Fine Motor Movements are Fluid and Coordinated: Yes Finger Nose Finger Test: WNL B UEs Motor  Motor Motor: Other (comment) Motor - Skilled Clinical Observations: Generalized weakness  Mobility    Minimal assist for lateral scoot transfers Trunk/Postural Assessment  Cervical Assessment Cervical Assessment: Exceptions to WFL(forward head) Thoracic Assessment Thoracic Assessment: Exceptions to WFL(kyphotic) Lumbar Assessment Lumbar Assessment: Exceptions to WFL(posterior pelvic tilt) Postural Control Postural Control: Within Functional Limits  Balance Balance Balance Assessed: Yes Dynamic Sitting  Balance Dynamic Sitting - Level of Assistance: 4: Min assist(donning Lt gripper sock) Extremity/Trunk Assessment RUE Assessment RUE Assessment: Within Functional Limits General Strength Comments: 3+/5 shoulder, 4-/5 distally  LUE Assessment LUE Assessment: Within Functional Limits General Strength Comments: 3+/5 shoulder, 4-/5 distally     Refer to Care Plan for Long Term Goals  Recommendations for other services: Neuropsych   Discharge Criteria: Patient will be discharged from OT if patient refuses treatment 3 consecutive times without medical reason, if treatment goals not met, if there is a change in medical status, if patient makes no progress towards goals or if patient is discharged from hospital.  The above assessment, treatment plan, treatment alternatives and goals were discussed and mutually agreed upon: by patient  Skeet Simmer 02/11/2018, 9:45 AM

## 2018-02-11 NOTE — Plan of Care (Signed)
  Problem: Consults Goal: RH LIMB LOSS PATIENT EDUCATION Description Description: See Patient Education module for eduction specifics. Outcome: Progressing Goal: Skin Care Protocol Initiated - if Braden Score 18 or less Description If consults are not indicated, leave blank or document N/A Outcome: Progressing   Problem: RH BOWEL ELIMINATION Goal: RH STG MANAGE BOWEL W/MEDICATION W/ASSISTANCE Description STG Manage Bowel with Medication with Assistance. Outcome: Progressing   Problem: RH SKIN INTEGRITY Goal: RH STG SKIN FREE OF INFECTION/BREAKDOWN Outcome: Progressing Goal: RH STG MAINTAIN SKIN INTEGRITY WITH ASSISTANCE Description STG Maintain Skin Integrity With Assistance. Outcome: Progressing   Problem: RH SAFETY Goal: RH STG ADHERE TO SAFETY PRECAUTIONS W/ASSISTANCE/DEVICE Description STG Adhere to Safety Precautions With Assistance/Device. Outcome: Progressing   Problem: RH PAIN MANAGEMENT Goal: RH STG PAIN MANAGED AT OR BELOW PT'S PAIN GOAL Outcome: Progressing   Problem: RH KNOWLEDGE DEFICIT LIMB LOSS Goal: RH STG INCREASE KNOWLEDGE OF SELF CARE AFTER LIMB LOSS Outcome: Progressing

## 2018-02-11 NOTE — Progress Notes (Signed)
Physical Therapy Assessment and Plan  Patient Details  Name: [name] MRN: [id number] Date of Birth: [birth year]  PT Diagnosis: Abnormality of gait, Difficulty walking, Muscle weakness and Pain in R residual limb Rehab Potential: Good ELOS: 7-10 days   Today's Date: 02/11/2018 PT Individual Time: 1100-1200 PT Individual Time Calculation (min): 60 min    Problem List:  Patient Active Problem List   Diagnosis Date Noted  . 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 Acute blood loss anemia 02/08/2018  . Post-operative pain   . Unilateral AKA, right ([place])   . Leukocytosis   . Ischemic foot 02/05/2018  . Ischemia of right lower extremity   . Pressure injury of skin 12/27/2017  . Arterial occlusive disease   . Postoperative pain   . CKD (chronic kidney disease), stage III ([place])   . History of CVA (cerebrovascular accident)   . 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 Septic arthritis of left sternoclavicular joint ([place]) 11/03/2017  . Cervical spinal cord compression ([place]) 10/30/2017  . Nasal abscess 09/25/2017  . Immunocompromised ([place]) 09/25/2017  . Cellulitis of right axilla 09/25/2017  . Anemia 08/05/2017  . Transient ischemic attack (TIA) 07/23/2017  . Subcortical infarction ([place])   . Right hemiparesis ([place])   . Ataxia 07/21/2017  . TIA (transient ischemic attack)   . Thrombocytopenia ([place])   . Wheezing 06/17/2017  . Bursitis of left elbow 05/01/2017  . Dystonia 12/29/2016  . PAD (peripheral artery disease) ([place]) 11/18/2016  . Osteoporosis 08/28/2016  . Paronychia of great toe, left 08/10/2016  . Carotid arterial disease ([place]) 07/09/2016  . Hypocalcemia 07/09/2016  . Diplopia 07/02/2016  . S/P lumbar laminectomy 03/11/2016  . Radicular pain of left lower extremity 02/21/2016  . Cough 01/10/2016  . 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 Myasthenia gravis ([place])   . Diabetes mellitus type 2 in nonobese (HCC)   . Coronary artery disease  involving native coronary artery of native heart without angina pectoris   . Ataxia, post-stroke   . Gait disturbance, post-stroke   . Proximal leg weakness   . Cervical spondylosis without myelopathy   . Achilles tendinitis of right lower extremity   . Chronic diastolic CHF (congestive heart failure) ([place])   . Gastroesophageal reflux disease without esophagitis   . Cerebral infarction involving left cerebellar artery ([place]) 06/20/2015  . Cerebellar stroke ([place])   . Ischemic stroke ([place])   . Cerebrovascular accident (CVA) due to thrombosis of left vertebral artery ([place])   . History of pulmonary embolus (PE)   . Chronic anticoagulation   . HLD (hyperlipidemia)   . Neck pain 03/19/2015  . Pain in the chest   . 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 Chronic diastolic heart failure, NYHA class 1 ([place]) 03/07/2015  . Diabetic gastroparesis ([place]) 03/07/2015  . Screening for osteoporosis 07/19/2014  . Dyspnea 04/09/2014  . Edema 04/09/2014  . Routine general medical examination at a health care facility 10/23/2013  . Bilateral carotid bruits 05/24/2013  . Abnormal CT scan, lung 05/24/2013  . Encounter for therapeutic drug monitoring 03/14/2013  . Lymphadenopathy, inguinal 01/11/2013  . AKI (acute kidney injury) ([place]) 10/28/2012  . 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 Myasthenia gravis with exacerbation ([place]) 03/31/2012  . Disease of salivary gland 12/09/2011  . Left facial pain 10/28/2011  . Pulmonary embolism ([place]) 10/25/2011  . 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 GERD with stricture 09/23/2011  .  Long term (current) use of anticoagulants 08/03/2011  . History of pulmonary embolism 07/30/2011  . History of DVT (deep vein thrombosis) 07/30/2011  . Subconjunctival hemorrhage 06/22/2011  . Bilateral conjunctivitis 04/09/2011  . Diabetes mellitus type 2, controlled ([place]) 12/18/2010  . Hearing loss 08/04/2010  . Ptosis of eyelid 12/30/2009  .  Essential hypertension 04/24/2009  . HYPERLIPIDEMIA, MIXED 10/05/2008  . HEMORRHOIDS-INTERNAL 07/02/2008  . HEMORRHOIDS-EXTERNAL 07/02/2008  . DIVERTICULOSIS-COLON 07/02/2008  . PERSONAL HX COLONIC POLYPS 07/02/2008  . RASH-NONVESICULAR 02/07/2008  . PNEUMONIA 12/08/2007  . 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 CAD (coronary artery disease) 07/07/2007  . NEPHROLITHIASIS, HX OF 07/07/2007    Past Medical History:  Past Medical History:  Diagnosis Date  . Bruises easily    d/t being on Eliquis  . Carotid artery disease ([place])    a. Duplex 05/2014: 21-30% RICA, 8-65% LICA, elevated velocities in right subclavian artery, normal left subclavian artery..  . Chronic back pain    HNP  . Chronic kidney disease (CKD)   . Claustrophobia    takes Ativan as needed  . 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 Coronary atherosclerosis of unspecified type of vessel, native or graft    a. cath in 2003 showed 10-20% LM, scattered 20% prox-mid LAD, 30% more distal LAD, 50-60% stenosis of LAD towards apex, 20% Cx, 30% dRCA, focal 95% stenosis in smaller of 2 branches of PDA, EF 60-65%.  . Diabetes mellitus ([place])   . Diarrhea    takes Imodium daily as needed  . Diverticulosis   . Diverticulosis of colon (without mention of hemorrhage)   . DJD (degenerative joint disease)   . DVT (deep venous thrombosis) ([place]) 07/2011  . Esophageal stricture   . Essential hypertension    takes Imdur and Lisinopril daily  . Gastric polyps    benign  . GERD (gastroesophageal reflux disease)    takes Nexium daily  . Glaucoma    uses eye drops  . History of bronchitis    not sure when the last time  . History of colon polyps    benign  . History of kidney stones   . History of kidney stones   . Insomnia    takes Melatonin nightly as needed  . Mixed hyperlipidemia    takes Crestor daily  . Myasthenia gravis ([place])    uses prednisone  . Osteoporosis 2016  .  Peripheral edema    takes Lasix daily  . Pituitary tumor    checks Dr[facility]Walden at [facility] checks it every Jan   . Pneumonia 01/2016  . PONV (postoperative nausea and vomiting)   . Ptosis   . Pulmonary embolus ([place]) 2012  . Stroke [facility]) 06/2015   Past Surgical History:  Past Surgical History:  Procedure Laterality Date  . ABDOMINAL AORTOGRAM W/LOWER EXTREMITY N/A 11/18/2016   Procedure: ABDOMINAL AORTOGRAM W/LOWER EXTREMITY;  Surgeon: [name], MD;  Location: [facility];  Service: Cardiovascular;  Laterality: N/A;  . AMPUTATION Right 02/07/2018   Procedure: right leg AMPUTATION ABOVE KNEE;  Surgeon: [name], MD;  Location: [place];  Service: Vascular;  Laterality: Right;  . AORTOGRAM Right 12/23/2017   Procedure: AORTOGRAM, RIGHT LOWER EXTREMITY ANGIOGRAM, RIGHT SUPERFICIAL FEMORAL ARTERY ANGIOPLASTY WITH STENT, RIGHT POPLITEAL ARTERY ANGIOPLASTY WITH STENT, LEFT GROIN CUTDOWN AND LEFT FEMORAL ARTERY REPAIR;  Surgeon: [name], MD;  Location: [place];  Service: Vascular;  Laterality: Right;  .  BACK SURGERY     x2, lumbar and cervical  . CARDIAC CATHETERIZATION    . CATARACT EXTRACTION Bilateral 12/12  . COLONOSCOPY    . ESOPHAGOGASTRODUODENOSCOPY ENDOSCOPY  04/2015  . IR GENERIC HISTORICAL  03/03/2016   IR IVC FILTER PLMT / S&I [name] GUID/MOD SED 03/03/2016 [name], MD [facility] RAD  . IR GENERIC HISTORICAL  04/09/2016   IR RADIOLOGIST EVAL & MGMT 04/09/2016 [name], MD [facility] INTER[facility] RAD  . IR GENERIC HISTORICAL  04/30/2016   IR IVC FILTER RETRIEVAL / S&I [name] GUID/MOD SED 04/30/2016 [name], MD [facility]-INTERV RAD  . LITHOTRIPSY    . LUMBAR LAMINECTOMY/DECOMPRESSION MICRODISCECTOMY Right 09/14/2012   Procedure: Right Lumbar three-four, four-five, Lumbar five-Sacral one decompressive laminectomy;  Surgeon: [name], MD;  Location: [place] [facility];  Service: Neurosurgery;  Laterality: Right;  . LUMBAR LAMINECTOMY/DECOMPRESSION MICRODISCECTOMY  Left 03/11/2016   Procedure: Left Lumbar Two-ThreeMicrodiscectomy;  Surgeon: [name], MD;  Location: [place];  Service: Neurosurgery;  Laterality: Left;  . PERIPHERAL VASCULAR INTERVENTION  11/18/2016   Procedure: PERIPHERAL VASCULAR INTERVENTION;  Surgeon: [name], MD;  Location: [place] [facility];  Service: Cardiovascular;;  Left popliteal  . SHOULDER SURGERY Left 05/07/2009  . TONSILLECTOMY      Assessment & Plan Clinical Impression:  Emrik Erhard is a 78 year old male withhistory of CKD, CML- on Gleevec, MG- on Mestinon and chronic steroids, subcortical CVA with CIR stay 2019, PVD with ischemic changes RLE who was admitted on 02/05/18 for worsening of symptoms. He underwent R-AKA by Dr. Scot Dock on 02/07/18. Post surgery, pain slowly improving and stump sock ordered by [employer]. Therapy ongoing and CIR recommended due to functional deficits.Patient transferred to CIR on 02/10/2018 .   Patient currently requires min to mod assist with mobility secondary to muscle weakness and decreased sitting balance, decreased standing balance and decreased balance strategies.  Prior to hospitalization, patient was modified independent  with mobility and lived with Spouse in a House home.  Home access is Per pt, 3 to foyer, and then 6 more steps to main levelStairs to enter.  Patient will benefit from skilled PT intervention to maximize safe functional mobility, minimize fall risk and decrease caregiver burden for planned discharge home with 24 hour supervision.  Anticipate patient will benefit from follow up [place] at discharge.  PT - End of Session Activity Tolerance: Tolerates 30+ min activity with multiple rests Endurance Deficit: Yes Endurance Deficit Description: fatigues quickly during therapy session requiring several rest breaks PT Assessment Rehab Potential (ACUTE/IP ONLY): Good PT Barriers to Discharge: Home environment access/layout;Medical stability PT Patient demonstrates impairments  in the following area(s): Balance;Endurance;Safety PT Transfers Functional Problem(s): Bed Mobility;Bed to Chair;Car;Furniture;Floor PT Locomotion Functional Problem(s): Ambulation;Wheelchair Mobility;Stairs PT Plan PT Intensity: Minimum of 1-2 x/day ,45 to 90 minutes PT Frequency: 5 out of 7 days PT Duration Estimated Length of Stay: 7-10 days PT Treatment/Interventions: Ambulation/gait training;Balance/vestibular training;Community reintegration;Discharge planning;Disease management/prevention;DME/adaptive equipment instruction;Functional mobility training;Neuromuscular re-education;Pain management;Patient/family education;Psychosocial support;Skin care/wound management;Stair training;Therapeutic Activities;Therapeutic Exercise;UE/LE Strength taining/ROM;UE/LE Coordination activities;Wheelchair propulsion/positioning PT Transfers Anticipated Outcome(s): Mod I PT Locomotion Anticipated Outcome(s): Mod I at w/c level PT Recommendation Follow Up Recommendations: Home health PT Patient destination: Home Equipment Recommended: Rolling walker with 5" wheels;Wheelchair (measurements);Wheelchair cushion (measurements) Equipment Details: pt owns RW, will need manual w/c  Skilled Therapeutic Intervention Evaluation completed (see details above and below) with education on PT POC and goals and individual treatment initiated with focus on functional transfer assessment and w/c mobility assessment.  Pt received seated in bed, no complaints of pain. Pt performs rolling L/R with Supervision and use of bedrails, supine to/from sit with min A. Lateral scoot transfer bed to w/c with min A. Manual w/c propulsion x 100 ft with use of BUE and Supervision, increased time needed to due fatigue. Sit to stand with min A to RW from elevated surface, mod A from w/c height. Pt is able to take a few steps forward with RW and min A for balance, v/c for safe sequencing. Car transfer with mod A and use of RW due to height  discrepancy between w/c seat height and simulated car height for patient's [employer]. Pt left seated in bed in care of NA to get set up for lunch.  PT Evaluation Precautions/Restrictions Precautions Precautions: Fall Precaution Comments: Rt AKA Restrictions Weight Bearing Restrictions: Yes RLE Weight Bearing: Non weight bearing Home Living/Prior Functioning Home Living Available Help at Discharge: Family;Available 24 hours/day Type of Home: House Home Access: Stairs to enter [employer] of Steps: Per pt, 3 to foyer, and then 6 more steps to main level Entrance Stairs-Rails: Left Home Layout: Multi-level;Able to live on main level with bedroom/bathroom  Lives With: Spouse Prior Function Level of Independence: Independent with gait;Independent with transfers;Requires assistive device for independence  Able to Take Stairs?: Yes Driving: Yes Vocation: Retired Vision/Perception  Vision - History Baseline Vision: No visual deficits Perception Perception: Within Functional Limits Praxis Praxis: Intact  Cognition Overall Cognitive Status: Within Functional Limits for tasks assessed Arousal/Alertness: Awake/alert Orientation Level: Oriented X4 Attention: Sustained Sustained Attention: Appears intact Memory: Appears intact Awareness: Appears intact Problem Solving: Appears intact Behaviors: Impulsive Safety/Judgment: Appears intact Sensation Sensation Light Touch: Appears Intact Proprioception: Appears Intact Coordination Gross Motor Movements are Fluid and Coordinated: Yes Fine Motor Movements are Fluid and Coordinated: Yes Motor  Motor Motor: Other (comment) Motor - Skilled Clinical Observations: Generalized weakness   Mobility Bed Mobility Bed Mobility: Rolling Right;Rolling Left;Supine to Sit;Sit to Supine Rolling Right: Supervision/verbal cueing Rolling Left: Supervision/Verbal cueing Supine to Sit: Minimal Assistance - Patient > 75% Sit to Supine:  Minimal Assistance - Patient > 75% Transfers Transfers: Lateral/Scoot Transfers Lateral/Scoot Transfers: Minimal Assistance - Patient > 75% Locomotion  Gait Gait Distance (Feet): 5 Feet Assistive device: Rolling walker Gait Gait Pattern: Impaired(R AKA) Gait velocity: decreased Stairs / Additional Locomotion Stairs: No Wheelchair Mobility Wheelchair Mobility: Yes Wheelchair Assistance: Chartered loss adjuster: Both upper extremities Wheelchair Parts Management: Needs assistance Distance: 100  Trunk/Postural Assessment  Cervical Assessment Cervical Assessment: Exceptions to WFL(forward head) Thoracic Assessment Thoracic Assessment: Exceptions to WFL(kyphotic) Lumbar Assessment Lumbar Assessment: Exceptions to WFL(posterior pelvic tilt) Postural Control Postural Control: Within Functional Limits  Balance Balance Balance Assessed: Yes Static Sitting Balance Static Sitting - Balance Support: No upper extremity supported;Feet supported Static Sitting - Level of Assistance: 5: Stand by assistance Dynamic Sitting Balance Dynamic Sitting - Balance Support: No upper extremity supported;Feet supported Dynamic Sitting - Level of Assistance: 4: Min assist Static Standing Balance Static Standing - Balance Support: Bilateral upper extremity supported;During functional activity Static Standing - Level of Assistance: 4: Min assist Dynamic Standing Balance Dynamic Standing - Balance Support: Bilateral upper extremity supported;During functional activity Dynamic Standing - Level of Assistance: 4: Min assist Extremity Assessment   RLE Assessment RLE Assessment: Exceptions to [facility] Passive Range of Motion (PROM) Comments: R AKA LLE Assessment LLE Assessment: Exceptions to [facility] Active Range of Motion (AROM) Comments: tight HS General Strength Comments: 4/5 grossly    Refer  to Care Plan for Long Term Goals  Recommendations for other services: None   Discharge  Criteria: Patient will be discharged from PT if patient refuses treatment 3 consecutive times without medical reason, if treatment goals not met, if there is a change in medical status, if patient makes no progress towards goals or if patient is discharged from hospital.  The above assessment, treatment plan, treatment alternatives and goals were discussed and mutually agreed upon: by patient  Excell Seltzer, PT, DPT  02/11/2018, 1:52 PM

## 2018-02-11 NOTE — Progress Notes (Signed)
[facility] PHYSICAL MEDICINE & REHABILITATION PROGRESS NOTE   Subjective/Complaints: Was a little restless last night. Otherwise no major problems.   ROS: Patient denies fever, rash, sore throat, blurred vision, nausea, vomiting, diarrhea, cough, shortness of breath or chest pain,   back pain, headache, or mood change.   Objective:   No results found. Recent Labs    02/10/18 0258 02/11/18 0644  WBC 13.1* 8.8  HGB 8.4* 9.2*  HCT 25.1* 28.0*  PLT 216 261   Recent Labs    02/10/18 0801 02/11/18 0644  NA 139 138  K 3.8 3.4*  CL 103 105  CO2 25 25  GLUCOSE 115* 97  BUN 18 14  CREATININE 1.87* 1.62*  CALCIUM 8.8* 8.4*    Intake/Output Summary (Last 24 hours) at 02/11/2018 1036 Last data filed at 02/11/2018 1010 Gross per 24 hour  Intake 480 ml  Output 700 ml  Net -220 ml     Physical Exam: Vital Signs Blood pressure (!) 146/59, pulse 66, temperature 98.1 F (36.7 C), temperature source Oral, resp. rate 20, height 5\' 7"  (1.702 m), weight 71.1 kg, SpO2 99 %. Constitutional: No distress . Vital signs reviewed. HEENT: EOMI, oral membranes moist Neck: supple Cardiovascular: RRR without murmur. No JVD    Respiratory: CTA Bilaterally without wheezes or rales. Normal effort    GI: BS +, non-tender, non-distended  Musculoskeletal:  General: Edemapresent. . Comments: Min edema R-AKA. Incision CDI with minimal serous drainage.  Left leg non-tender Neurological: He is alertand oriented to person, place, and time. Nocranial nerve deficit. UE 5/5. RLE: 3/5 HF,  LLE 4/5 prox to distal. Skin: Skin iswarm. Noerythema.  Psychiatric: pleasant and appropriate.    Assessment/Plan: 1. Functional deficits secondary to right aka which require 3+ hours per day of interdisciplinary therapy in a comprehensive inpatient rehab setting.  Physiatrist is providing close team supervision and 24 hour management of active medical problems listed below.  Physiatrist and  rehab team continue to assess barriers to discharge/monitor patient progress toward functional and medical goals  Care Tool:  Bathing    Body parts bathed by patient: Right arm, Left arm, Chest, Abdomen, Front perineal area, Buttocks, Right upper leg, Left upper leg, Left lower leg, Face     Body parts n/a: Right lower leg   Bathing assist Assist Level: Contact Guard/Touching assist     Upper Body Dressing/Undressing Upper body dressing   What is the patient wearing?: Pull over shirt    Upper body assist Assist Level: Set up assist    Lower Body Dressing/Undressing Lower body dressing      What is the patient wearing?: Pants, Ace wrap/stump shrinker     Lower body assist Assist for lower body dressing: Minimal Assistance - Patient > 75%     Toileting Toileting    Toileting assist Assist for toileting: Moderate Assistance - Patient 50 - 74%(pt legs are weak) Assistive Device Comment: (urinal patient spills requiring pad change)   Transfers Chair/bed transfer  Transfers assist           Locomotion Ambulation   Ambulation assist              Walk 10 feet activity   Assist           Walk 50 feet activity   Assist           Walk 150 feet activity   Assist           Walk 10 feet on uneven  surface  activity   Assist           Wheelchair     Assist               Wheelchair 50 feet with 2 turns activity    Assist            Wheelchair 150 feet activity     Assist          Medical Problem List and Plan: 1.Functional deficitssecondary to PAD, right AKA. -beginning therapies -prosthetic education 2.PE/DVT Anticoagulation:Pharmaceutical:Other (comment)Eliquis resumed today. 3. Pain Management:Oxycodone prn effective. 4. Mood:LCSW to follow for evaluation and support 5. Neuropsych: This patientiscapable of making decisions on hisown behalf. 6. Skin/Wound  Care:  -Continue protein supplement.  -stump sock---shrinker next week 7. Fluids/Electrolytes/Nutrition:encourage PO  -supplement potassium 8.Acute on chronic CKD:Monitor with serial check. SCr improving.1.82--->1.62 1/3 9. XT:KWIOXBD on mestinon andchronic steriods. 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 Frequency: still voiding frequently  -likely lasix related- dose was reduced to 20mg  prior to admit  -check ua/ucx  -awaiting PVR check 12. Acute on chronic anemia: Continue to monitor. 13. Leucocytosis: Reactive --resolving. 8.8 1/3 14. HTN: Monitor BP bid--continue Lasix and Imdur.  15. Insomnia: Trazodone added to help with sleep wake cycle.  16. PVD s/p stenting: On Plavix and Crestor.    LOS: 1 days A FACE TO [place] 02/11/2018, 10:36 AM

## 2018-02-11 NOTE — Progress Notes (Signed)
Physical Therapy Session Note  Patient Details  Name: [name] MRN: [id number] Date of Birth: [birth year]  Today's Date: 02/11/2018 PT Individual Time: 7282-0601 PT Individual Time Calculation (min): 30 min   Short Term Goals: Week 1:  PT Short Term Goal 1 (Week 1): =LTG due to ELOS  Skilled Therapeutic Interventions/Progress Updates:  Pt received seated on mat in gym from OT, agreeable to PT session. No complaints of pain. Lateral scoot transfer mat table to w/c with min A. Sit to stand in // bars with mod A from w/c height. Ascend/descend one 1" step in // bars with mod A for balance. Per pt he only has one handrail on stairs at home so will not likely be able to hop up/down stairs. Demonstrated method of turning to sit on stairs then scooting up them backwards, will progress to having pt trial this method. Pt requests to use the bathroom. Lateral scoot transfer w/c to/from elevated commode seat over toilet with mod A. Pt is dependent for clothing management and independent for pericare. Pt left seated in w/c in room with family and friends present.  Therapy Documentation Precautions:  Precautions Precautions: Fall Precaution Comments: Rt AKA Restrictions Weight Bearing Restrictions: Yes RLE Weight Bearing: Non weight bearing   Therapy/Group: Individual Therapy  Excell Seltzer, PT, DPT  02/11/2018, 4:00 PM

## 2018-02-11 NOTE — Progress Notes (Signed)
Retta Diones, RN  Rehab Admission Coordinator  Physical Medicine and Rehabilitation  PMR Pre-admission  Signed  Date of Service:  02/10/2018 2:45 PM       Related encounter: ED to Hosp-Admission (Discharged) from 02/05/2018 in [facility] 4E CV SURGICAL PROGRESSIVE CARE      Signed         Show:Clear all [x] Manual[x] Template[x] Copied  Added by: [x] Milen Lengacher, Evalee Mutton, RN  [] Hover for details PMR Admission Coordinator Pre-Admission Assessment  Patient: [name] is an 78 y.o., male MRN: [id number] DOB: [birth year] Height: 5' 6.93" (170 cm) Weight: 74.5 kg                                                                                                                                                  Insurance Information HMO: No    PPO:       PCP:       IPA:       80/20:       OTHER:   PRIMARY: Medicare A and B      Policy#: [id number]      Subscriber: Patient CM Name:        Phone#:       Fax#:   Pre-Cert#:        Employer: Retired Benefits:  Phone #:       Name: Checked in [place]. Date: 04/10/03     Deduct: $1408      Out of Pocket Max: none      Life Max: N/A CIR: 100%      SNF: 100 days Outpatient: 80%     Co-Pay: 20% Home Health: 100%      Co-Pay: none DME: 80%     Co-Pay: 20% Providers: patient's choice  SECONDARY: [employer] Choice care      Policy#: [id number]      Subscriber: patient CM Name:        Phone#:       Fax#:   Pre-Cert#:        Employer: Retired Benefits:  Phone #: [phone]     Name:   Eff. 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 He underwent R-AKA by Dr. Scot Dock on 02/07/18. Post surgery, pain slowly improving and stump sock ordered by [employer]. Therapy ongoing and CIR recommended due to functional deficits.   Past Medical History      Past Medical History:  Diagnosis Date  . Bruises easily    d/t being on Eliquis  . Carotid artery disease ([place])    a. Duplex 05/2014: 29-93% RICA, 7-16% LICA, elevated velocities in right subclavian artery, normal left subclavian artery..  . Chronic back pain    HNP  . Chronic kidney disease (CKD)   . Claustrophobia    takes Ativan as needed  . 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 Coronary atherosclerosis of unspecified type of vessel, native or graft    a. cath in 2003 showed 10-20% LM, scattered 20% prox-mid LAD, 30% more distal LAD, 50-60% stenosis of LAD towards apex, 20% Cx, 30% dRCA, focal 95% stenosis in smaller of 2 branches of PDA, EF 60-65%.  . Diabetes mellitus ([place])   . Diarrhea    takes Imodium daily as needed  . Diverticulosis   . Diverticulosis of colon (without mention of hemorrhage)   . DJD (degenerative joint disease)   . DVT (deep venous thrombosis) ([place]) 07/2011  . Esophageal stricture   . Essential hypertension    takes Imdur and Lisinopril daily  . Gastric polyps    benign  . GERD (gastroesophageal reflux disease)    takes Nexium daily  . Glaucoma    uses eye drops  . History of bronchitis    not sure when the last time  . History of colon polyps    benign  . History of kidney stones   . History of kidney stones   . Insomnia    takes Melatonin nightly as needed  . Mixed hyperlipidemia     takes Crestor daily  . Myasthenia gravis ([place])    uses prednisone  . Osteoporosis 2016  . Peripheral edema    takes Lasix daily  . Pituitary tumor    checks [facility]Walden [facility] checks it every Jan   . Pneumonia 01/2016  . PONV (postoperative nausea and vomiting)   . Ptosis   . Pulmonary embolus ([place]) 2012  . Stroke [facility]) 06/2015    Family History  family history includes Breast cancer in his sister; Clotting disorder in his sister; Dementia in his sister; Heart attack in his father, mother, sister, sister, and sister; Heart disease in his father, mother, and sister; Stroke in his father and mother.  Prior Rehab/Hospitalizations: Was on CIR in 2019.  Had [facility] therapies through [facility].  Was hospitalized in 09/19 for shoulder infection and need for PICC line and antibiotics.  Has the patient had major surgery during 100 days prior to admission? Yes.  He had stents placed in Oct-Nov 2019 per patient and wife.  Current Medications   Current Facility-Administered Medications:  .  acetaminophen (TYLENOL) tablet 325-650 mg, 325-650 mg, Oral, Q4H PRN, 325 mg at 02/08/18 0904 **OR** acetaminophen (TYLENOL) suppository 325-650 mg, 325-650 mg, Rectal, Q4H PRN, [name], MD .  alum &  mag hydroxide-simeth (MAALOX/MYLANTA) 200-200-20 MG/5ML suspension 15-30 mL, 15-30 mL, Oral, Q2H PRN, [name], MD .  apixaban [facility] [place]) tablet 5 mg, 5 mg, Oral, BID, Samuella Cota, MD, 5 mg at 02/10/18 1127 .  brimonidine (ALPHAGAN) 0.2 % ophthalmic solution 1 drop, 1 drop, Both Eyes, BID, [name], MD, 1 drop at 02/10/18 [phone] .  cholecalciferol (VITAMIN D3) tablet 1,000 Units, 1,000 Units, Oral, Daily, [name], MD, 1,000 Units at 02/10/18 [phone] .  [START ON 02/11/2018] clopidogrel (PLAVIX) tablet 75 mg, 75 mg, Oral, Daily, Samuella Cota, MD .  furosemide (LASIX) tablet 20 mg, 20 mg, Oral, Daily, [name], MD, 20 mg at 02/10/18  7616 .  guaiFENesin-dextromethorphan (ROBITUSSIN DM) 100-10 MG/5ML syrup 15 mL, 15 mL, Oral, Q4H PRN, [name], MD .  hydrALAZINE (APRESOLINE) injection 5 mg, 5 mg, Intravenous, Q20 Min PRN, [name], MD .  imatinib (GLEEVEC) tablet 300 mg, 300 mg, Oral, Q lunch, [name], MD, 300 mg at 02/10/18 1125 .  insulin aspart (novoLOG) injection 0-9 Units, 0-9 Units, Subcutaneous, TID WC, [name], MD, 1 Units at 02/09/18 1631 .  isosorbide mononitrate (IMDUR) 24 hr tablet 30 mg, 30 mg, Oral, Daily, [name], MD, 30 mg at 02/10/18 0737 .  lactated ringers infusion, , Intravenous, Continuous, [name], MD, Last Rate: 10 mL/hr at 02/07/18 1255 .  metoprolol tartrate (LOPRESSOR) injection 2-5 mg, 2-5 mg, Intravenous, Q2H PRN, [name], MD .  multivitamin with minerals tablet 1 tablet, 1 tablet, Oral, Daily, [name], MD, 1 tablet at 02/10/18 [phone] .  ondansetron (ZOFRAN) injection 4 mg, 4 mg, Intravenous, Q6H PRN, [name], MD .  oxyCODONE-acetaminophen (PERCOCET/ROXICET) 5-325 MG per tablet 1-2 tablet, 1-2 tablet, Oral, Q4H PRN, Samuella Cota, MD, 2 tablet at 02/10/18 1037 .  pantoprazole (PROTONIX) EC tablet 40 mg, 40 mg, Oral, Daily, [name], MD, 40 mg at 02/10/18 6948 .  phenol (CHLORASEPTIC) mouth spray 1 spray, 1 spray, Mouth/Throat, PRN, [name], MD .  polyethylene glycol (MIRALAX / GLYCOLAX) packet 17 g, 17 g, Oral, BID, Samuella Cota, MD, 17 g at 02/09/18 0844 .  predniSONE (DELTASONE) tablet 3 mg, 3 mg, Oral, Q breakfast, [name], MD, 3 mg at 02/10/18 5462 .  pyridostigmine (MESTINON) tablet 60 mg, 60 mg, Oral, Daily, [name], MD, 60 mg at 02/10/18 7035 .  rosuvastatin (CRESTOR) tablet 5 mg, 5 mg, Oral, Daily, [name], MD, 5 mg at 02/10/18 0093 .  senna-docusate (Senokot-S) tablet 1 tablet, 1  tablet, Oral, QHS PRN, [name], MD .  timolol (TIMOPTIC) 0.5 % ophthalmic solution 1 drop, 1 drop, Both Eyes, BID, [name], MD, 1 drop at 02/10/18 0809 .  traZODone (DESYREL) tablet 50 mg, 50 mg, Oral, QHS, Samuella Cota, MD  Patients Current Diet:     Diet Order                  Diet heart healthy/carb modified Room service appropriate? Yes; Fluid consistency: Thin  Diet effective now               Precautions / Restrictions Precautions Precautions: Fall Restrictions Weight Bearing Restrictions: No   Has the patient had 2 or more falls or a fall with injury in the past year?Yes.  Patient and wife report 2 falls this past year.  Prior Activity Level Community (5-7x/wk): [employer]  out daily, was driving up until pain prevented him from driving.  Home Assistive Devices / Equipment Home Assistive Devices/Equipment: Environmental consultant (specify type) Home Equipment: Shower seat - built in, [place] - 2 wheels, [place] - single point  Prior Device Use: Indicate devices/aids used by the patient prior to current illness, exacerbation or injury? Walker and [employer]  Prior Functional Level Prior Function Level of Independence: Needs Licensed conveyancer / Transfers Assistance Needed: pt used RW ADL's / Homemaking Assistance Needed:  assistance for LB dressing  Comments: Enjoys spending time with family  Self Care: Did the patient need help bathing, dressing, using the toilet or eating?  Independent  Indoor Mobility: Did the patient need assistance with walking from room to room (with or without device)? Independent  Stairs: Did the patient need assistance with internal or external stairs (with or without device)? Independent  Functional Cognition: Did the patient need help planning regular tasks such as shopping or remembering to take medications? Independent  Current Functional Level Cognition  Overall Cognitive Status: Within Functional Limits for  tasks assessed Orientation Level: Oriented X4    Extremity Assessment (includes Sensation/Coordination)  Upper Extremity Assessment: Overall WFL for tasks assessed  Lower Extremity Assessment: RLE deficits/detail RLE Deficits / Details: right AKA    ADLs  Overall ADL's : Needs assistance/impaired Eating/Feeding: Independent, Sitting Grooming: Set up, Sitting, Oral care Upper Body Bathing: Min guard, Sitting Lower Body Bathing: Moderate assistance, Sitting/lateral leans Upper Body Dressing : Sitting Lower Body Dressing: Maximal assistance, Sitting/lateral leans Toilet Transfer: Moderate assistance, +2 for physical assistance, Cueing for safety, RW, Stand-pivot, BSC Toileting- Clothing Manipulation and Hygiene: Moderate assistance, Sitting/lateral lean Functional mobility during ADLs: Rolling walker, Moderate assistance, +2 for safety/equipment General ADL Comments: educated in LB ADL and pericare leaning side to side in sitting    Mobility  Overal bed mobility: Needs Assistance Bed Mobility: Sit to Supine Supine to sit: Min assist Sit to supine: Min assist General bed mobility comments: Assist to bring LLE up into bed    Transfers  Overall transfer level: Needs assistance Equipment used: Ambulation equipment used Transfers: Sit to/from Stand Sit to Stand: +2 physical assistance, Mod assist Stand pivot transfers: Mod assist, +2 physical assistance  Lateral/Scoot Transfers: Min guard General transfer comment: Assist to bring hips up and for balance. Stood x 2 for ~20 sec. Used Stedy to go from [employer] to bed    Ambulation / Gait / Stairs / Office manager / Balance Balance Overall balance assessment: Needs assistance Sitting-balance support: No upper extremity supported, Feet supported Sitting balance-Leahy Scale: Fair Standing balance support: Bilateral upper extremity supported Standing balance-Leahy Scale: Poor Standing balance comment:  Stedy and +2 min assist for static standing x 20 sec    Special needs/care consideration BiPAP/CPAP No CPM No Continuous Drip IV No Dialysis No         Life Vest No Oxygen No Special Bed No Trach Size No Wound Vac (area) No       Skin R AKA stapled incision with dressing                              Bowel mgmt: Last BM 02/10/18 Bladder mgmt: Voiding in urinal Diabetic mgmt No    Previous Home Environment Living Arrangements: Spouse/significant other Available Help at Discharge: Family, Available 24 hours/day Type of Home: House Home Layout: Multi-level Alternate Level Stairs-Rails: Left Alternate Level Stairs-Number of  Steps: 9 Home Access: Stairs to enter Entrance Stairs-Rails: Left Entrance Stairs-Number of Steps: 3 from porch, then 9 to first floor  Bathroom Shower/Tub: Multimedia programmer: Handicapped height Home Care Services: No Additional Comments: Walk-in shower with built in bench  Discharge [place] for Discharge Living Setting: Patient's home, House, Lives with (comment)(Lives with wife.) Type of Home at Discharge: House Discharge Home Layout: Two level(Split level home) Alternate Level Stairs-Number of Steps: 6 steps up and then 6 steps down to alternate level. Discharge Home Access: Stairs to enter Entrance Stairs-Number of Steps: 3 step entry Discharge Bathroom Shower/Tub: Walk-in shower, Curtain Discharge Bathroom Toilet: Handicapped height Discharge Bathroom Accessibility: Yes How Accessible: Accessible via walker Does the patient have any problems obtaining your medications?: No  Social/Family/Support Systems Patient Roles: Spouse, Parent(Has a wife, a son and a daughter.) Contact Information: Raynald Rouillard - spouse Anticipated Caregiver: wife Anticipated Caregiver's Contact Information: Hassan Rowan - wife - [phone] Ability/Limitations of Caregiver: Wife is retired and can assist. Careers adviser: 24/7 Discharge  Plan Discussed with Primary Caregiver: Yes Is Caregiver In Agreement with Plan?: Yes Does Caregiver/Family have Issues with Lodging/Transportation while Pt is in Rehab?: No  Goals/Additional Needs Patient/Family Goal for Rehab: PT/OT mod I and supervision goals Expected length of stay: 7-10 days Cultural Considerations: Attends [place] Needs: Heart Healthy, carb mod, thin liquids Equipment Needs: TBD Pt/Family Agrees to Admission and willing to participate: Yes Program Orientation Provided & Reviewed with Pt/Caregiver Including Roles  & Responsibilities: Yes  Decrease burden of Care through IP rehab admission: N/A  Possible need for SNF placement upon discharge: Not planned  Patient Condition: This patient's medical and functional status has changed since the consult dated: 02/08/18 in which the Rehabilitation Physician determined and documented that the patient's condition is appropriate for intensive rehabilitative care in an inpatient rehabilitation facility. See "History of Present Illness" (above) for medical update. Functional changes are:  Currently requiring mod assist +2 for transfers. Patient's medical and functional status update has been discussed with the Rehabilitation physician and patient remains appropriate for inpatient rehabilitation. Will admit to inpatient rehab today.  Preadmission Screen Completed By:  Retta Diones, 02/10/2018 3:02 PM ______________________________________________________________________   Discussed status with Dr. Naaman Plummer on 1/2/20at  1501 and received telephone approval for admission today.  Admission Coordinator:  Retta Diones, time 1501/Date 02/10/18           Cosigned by: Meredith Staggers, MD at 02/10/2018 3:31 PM  Revision History

## 2018-02-11 NOTE — Progress Notes (Signed)
Occupational Therapy Session Note  Patient Details  Name: [name] MRN: [id number] Date of Birth: Oct 05, 1940  Today's Date: 02/11/2018 OT Individual Time: 1415-1500 OT Individual Time Calculation (min): 45 min    Short Term Goals: Week 1:  OT Short Term Goal 1 (Week 1): STGs=LTGs due to ELOS  Skilled Therapeutic Interventions/Progress Updates:    Session focused on R residual limb positioning/AROM, prosthetic education, and strengthening/conditioning in prone. Pt completed lateral scoot transfer with min A bed > w/c. Pt propelled w/c 100 ft before c/o fatigue. Pt transferred to [facility] and then transitioned to supine with min A for positioning L UE. Pt completed 3 sets of glute activation with residual limb extension, with tactile cues and min A provided for proper form. Pt completed adapted push ups 3 sets of 10 with heavy encouragement and vc for form. Pt completed 2 set of mod A "superman" exercises focused on back extension. Pt transitioned back to sitting EOM with min A. Extended rest break provided to pt d/t c/o fatigue. Pt was passed off to PT in gym. Pain as reported below.   Therapy Documentation Precautions:  Precautions Precautions: Fall Precaution Comments: Rt AKA Restrictions Weight Bearing Restrictions: Yes RLE Weight Bearing: Non weight bearing Pain: Pain Assessment Pain Scale: 0-10 Pain Score: 4  Pain Type: Surgical pain Pain Location: Leg Pain Orientation: Right Pain Descriptors / Indicators: Sore Pain Onset: On-going Pain Intervention(s): Repositioned;Emotional support   Therapy/Group: Individual Therapy  Curtis Sites 02/11/2018, 3:41 PM

## 2018-02-11 NOTE — IPOC Note (Addendum)
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[place])     Functional Problem List: Nursing Endurance, Medication Management, Motor, Pain, Perception, Safety, Sensory, Skin Integrity  PT Balance, Endurance, Safety  OT Balance, Edema, Safety, Pain, Endurance, Skin Integrity  SLP    TR   Activity tolerance, functional mobility, balance, safety, pain, anxiety/stress        Basic ADL's: OT Dressing, Toileting, Grooming, Bathing     Advanced  ADL's: OT Light Housekeeping, Simple Meal Preparation     Transfers: PT Bed Mobility, Bed to Chair, Car, Furniture, Futures trader, Metallurgist: PT Ambulation, Emergency planning/management officer, Stairs     Additional Impairments: OT None  SLP        TR  community integration    Anticipated Outcomes Item Anticipated Outcome  Self Feeding No goal   Swallowing      Basic self-care  Supervision/setup-Mod I   Toileting  Supervision/setup    Bathroom Transfers Supervision/setup   Bowel/Bladder  mod I  Transfers  Mod I  Locomotion  Mod I at w/c level  Communication     Cognition     Pain  less than 2  Safety/Judgment  mod I   Therapy Plan: PT Intensity: Minimum of 1-2 x/day ,45 to 90 minutes PT Frequency: 5 out of 7 days PT Duration Estimated Length of Stay: 7-10 days OT Intensity: Minimum of 1-2 x/day, 45 to 90 minutes OT Frequency: 5 out of 7 days OT Duration/Estimated Length of Stay: 7-10 days  TR Duration/ELOS:  Discharge 1/11 TR Frequency:  Min 1 time for community reintegration >45 minutes during LOS        Team Interventions: Nursing Interventions Pain Management, Skin Care/Wound Management, Psychosocial Support, Disease Management/Prevention, Medication Management, Cognitive Remediation/Compensation, Discharge Planning  PT interventions  Ambulation/gait training, Training and development officer, Community reintegration, Discharge planning, Disease management/prevention, DME/adaptive equipment instruction, Functional mobility training, Neuromuscular re-education, Pain management, Patient/family education, Psychosocial support, Skin care/wound management, Stair training, Therapeutic Activities, Therapeutic Exercise, UE/LE Strength taining/ROM, UE/LE Coordination activities, Wheelchair propulsion/positioning  OT Interventions Community reintegration, Discharge planning, Disease mangement/prevention, DME/adaptive equipment instruction, Functional mobility training, Pain management, Patient/family education, Psychosocial support, Self Care/advanced ADL retraining, Skin care/wound managment, Therapeutic Activities, Therapeutic Exercise, UE/LE Strength taining/ROM, UE/LE Coordination activities, Wheelchair propulsion/positioning  SLP Interventions    TR Interventions  Recreation/leisure participation, Balance/Vestibular training, functional mobility, therapeutic activities, UE/LE strength/coordination, w/c mobility, community reintegration, pt/family education, adaptive equipment instruction/use, discharge planning, psychosocial support  SW/CM Interventions Discharge Planning, Psychosocial Support, Patient/Family Education   Barriers to Discharge MD  Medical stability  Nursing Other (comments)    PT Home environment access/layout, Medical stability    OT Inaccessible home environment, Home environment access/layout, Medical stability, Wound Care    SLP      SW       Team Discharge Planning: Destination: PT-Home ,OT- Home , SLP-  Projected Follow-up: PT-Home health PT, OT-  Home health OT, SLP-  Projected Equipment Needs: PT-Rolling walker with 5" wheels, Wheelchair (measurements), Wheelchair cushion (measurements), OT- To be determined, SLP-  Equipment Details: PT-pt owns RW, will need manual w/c, OT-  Patient/family involved in  discharge planning: PT- Patient,  OT-Patient, SLP-   MD ELOS: 7-10 days Medical Rehab Prognosis:  Excellent Assessment: The patient has been admitted for CIR therapies with the diagnosis of right AKA. The  team will be addressing functional mobility, strength, stamina, balance, safety, adaptive techniques and equipment, self-care, bowel and bladder mgt, patient and caregiver education, pain mgt, pre-prosthetic planning/education, wound care. Goals have been set at supervision to mod I with mobiility and self-care at/ w/c level   Meredith Staggers, MD, [facility]      See Team Conference Notes for weekly updates to the plan of care

## 2018-02-11 NOTE — Progress Notes (Signed)
Social Work  Social Work Assessment and Plan  Patient Details  Name: [name] MRN: [id number] Date of Birth: 1940/12/24  Today's Date: 02/11/2018  Problem List:  Patient Active Problem List   Diagnosis Date Noted  . 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 Acute blood loss anemia 02/08/2018  . Post-operative pain   . Unilateral AKA, right ([place])   . Leukocytosis   . Ischemic foot 02/05/2018  . Ischemia of right lower extremity   . Pressure injury of skin 12/27/2017  . Arterial occlusive disease   . Postoperative pain   . CKD (chronic kidney disease), stage III ([place])   . History of CVA (cerebrovascular accident)   . 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 Septic arthritis of left sternoclavicular joint ([place]) 11/03/2017  . Cervical spinal cord compression ([place]) 10/30/2017  . Nasal abscess 09/25/2017  . Immunocompromised ([place]) 09/25/2017  . Cellulitis of right axilla 09/25/2017  . Anemia 08/05/2017  . Transient ischemic attack (TIA) 07/23/2017  . Subcortical infarction ([place])   . Right hemiparesis ([place])   . Ataxia 07/21/2017  . TIA (transient ischemic attack)   . Thrombocytopenia ([place])   . Wheezing 06/17/2017  . Bursitis of left elbow 05/01/2017  . Dystonia 12/29/2016  . PAD (peripheral artery disease) ([place]) 11/18/2016  . Osteoporosis 08/28/2016  . Paronychia of great toe, left 08/10/2016  . Carotid arterial disease ([place]) 07/09/2016  . Hypocalcemia 07/09/2016  . Diplopia 07/02/2016  . S/P lumbar laminectomy 03/11/2016  . Radicular pain of left lower extremity 02/21/2016  . Cough 01/10/2016  . 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 Myasthenia gravis ([place])   . Diabetes mellitus type 2 in nonobese (HCC)   . Coronary artery disease involving native coronary artery of native heart without angina pectoris   . Ataxia, post-stroke   . Gait disturbance, post-stroke   . Proximal leg weakness   . Cervical spondylosis without myelopathy   .  Achilles tendinitis of right lower extremity   . Chronic diastolic CHF (congestive heart failure) ([place])   . Gastroesophageal reflux disease without esophagitis   . Cerebral infarction involving left cerebellar artery ([place]) 06/20/2015  . Cerebellar stroke ([place])   . Ischemic stroke ([place])   . Cerebrovascular accident (CVA) due to thrombosis of left vertebral artery ([place])   . History of pulmonary embolus (PE)   . Chronic anticoagulation   . HLD (hyperlipidemia)   . Neck pain 03/19/2015  . Pain in the chest   . 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 Chronic diastolic heart failure, NYHA class 1 ([place]) 03/07/2015  . Diabetic gastroparesis ([place]) 03/07/2015  . Screening for osteoporosis 07/19/2014  . Dyspnea 04/09/2014  . Edema 04/09/2014  . Routine general medical examination at a health care facility 10/23/2013  . Bilateral carotid bruits 05/24/2013  . Abnormal CT scan, lung 05/24/2013  . Encounter for therapeutic drug monitoring 03/14/2013  . Lymphadenopathy, inguinal 01/11/2013  . AKI (acute kidney injury) ([place]) 10/28/2012  . 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 Myasthenia gravis with exacerbation ([place]) 03/31/2012  . Disease of salivary gland 12/09/2011  . Left facial pain 10/28/2011  . Pulmonary embolism ([place]) 10/25/2011  . 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 GERD with stricture 09/23/2011  . Long term (current) use of anticoagulants 08/03/2011  . History of pulmonary embolism 07/30/2011  . History of DVT (deep vein thrombosis) 07/30/2011  . Subconjunctival hemorrhage 06/22/2011  . Bilateral conjunctivitis 04/09/2011  .  Diabetes mellitus type 2, controlled ([place]) 12/18/2010  . Hearing loss 08/04/2010  . Ptosis of eyelid 12/30/2009  . Essential hypertension 04/24/2009  . HYPERLIPIDEMIA, MIXED 10/05/2008  . HEMORRHOIDS-INTERNAL 07/02/2008  . HEMORRHOIDS-EXTERNAL 07/02/2008  . DIVERTICULOSIS-COLON 07/02/2008  . PERSONAL HX COLONIC POLYPS  07/02/2008  . RASH-NONVESICULAR 02/07/2008  . PNEUMONIA 12/08/2007  . 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 CAD (coronary artery disease) 07/07/2007  . NEPHROLITHIASIS, HX OF 07/07/2007   Past Medical History:  Past Medical History:  Diagnosis Date  . Bruises easily    d/t being on Eliquis  . Carotid artery disease ([place])    a. Duplex 05/2014: 10-93% RICA, 2-35% LICA, elevated velocities in right subclavian artery, normal left subclavian artery..  . Chronic back pain    HNP  . Chronic kidney disease (CKD)   . Claustrophobia    takes Ativan as needed  . 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 Coronary atherosclerosis of unspecified type of vessel, native or graft    a. cath in 2003 showed 10-20% LM, scattered 20% prox-mid LAD, 30% more distal LAD, 50-60% stenosis of LAD towards apex, 20% Cx, 30% dRCA, focal 95% stenosis in smaller of 2 branches of PDA, EF 60-65%.  . Diabetes mellitus ([place])   . Diarrhea    takes Imodium daily as needed  . Diverticulosis   . Diverticulosis of colon (without mention of hemorrhage)   . DJD (degenerative joint disease)   . DVT (deep venous thrombosis) ([place]) 07/2011  . Esophageal stricture   . Essential hypertension    takes Imdur and Lisinopril daily  . Gastric polyps    benign  . GERD (gastroesophageal reflux disease)    takes Nexium daily  . Glaucoma    uses eye drops  . History of bronchitis    not sure when the last time  . History of colon polyps    benign  . History of kidney stones   . History of kidney stones   . Insomnia    takes Melatonin nightly as needed  . Mixed hyperlipidemia    takes Crestor daily  . Myasthenia gravis ([place])    uses prednisone  . Osteoporosis 2016  . Peripheral edema    takes Lasix daily  . Pituitary tumor    checks Dr.Walden at [facility] checks it every Jan   . Pneumonia 01/2016  . PONV (postoperative nausea and vomiting)   . Ptosis   . Pulmonary embolus ([place])  2012  . Stroke [facility]) 06/2015   Past Surgical History:  Past Surgical History:  Procedure Laterality Date  . ABDOMINAL AORTOGRAM W/LOWER EXTREMITY N/A 11/18/2016   Procedure: ABDOMINAL AORTOGRAM W/LOWER EXTREMITY;  Surgeon: [name], MD;  Location: [place] [facility];  Service: Cardiovascular;  Laterality: N/A;  . AMPUTATION Right 02/07/2018   Procedure: right leg AMPUTATION ABOVE KNEE;  Surgeon: [name], MD;  Location: [facility];  Service: Vascular;  Laterality: Right;  . AORTOGRAM Right 12/23/2017   Procedure: AORTOGRAM, RIGHT LOWER EXTREMITY ANGIOGRAM, RIGHT SUPERFICIAL FEMORAL ARTERY ANGIOPLASTY WITH STENT, RIGHT POPLITEAL ARTERY ANGIOPLASTY WITH STENT, LEFT GROIN CUTDOWN AND LEFT FEMORAL ARTERY REPAIR;  Surgeon: [name], MD;  Location: [facility];  Service: Vascular;  Laterality: Right;  . BACK SURGERY     x2, lumbar and cervical  . CARDIAC CATHETERIZATION    . CATARACT EXTRACTION Bilateral 12/12  . COLONOSCOPY    . ESOPHAGOGASTRODUODENOSCOPY ENDOSCOPY  04/2015  . IR  GENERIC HISTORICAL  03/03/2016   IR IVC FILTER PLMT / S&I [name] GUID/MOD SED 03/03/2016 [name], MD [facility]INTER[facility] RAD  . IR GENERIC HISTORICAL  04/09/2016   IR RADIOLOGIST EVAL & MGMT 04/09/2016 [name], MD [facility] INTER[facility] RAD  . IR GENERIC HISTORICAL  04/30/2016   IR IVC FILTER RETRIEVAL / S&I [name] GUID/MOD SED 04/30/2016 [name], MD [facility]-INTERV RAD  . LITHOTRIPSY    . LUMBAR LAMINECTOMY/DECOMPRESSION MICRODISCECTOMY Right 09/14/2012   Procedure: Right Lumbar three-four, four-five, Lumbar five-Sacral one decompressive laminectomy;  Surgeon: [name], MD;  Location: [place] [facility];  Service: Neurosurgery;  Laterality: Right;  . LUMBAR LAMINECTOMY/DECOMPRESSION MICRODISCECTOMY Left 03/11/2016   Procedure: Left Lumbar Two-ThreeMicrodiscectomy;  Surgeon: [name], MD;  Location: [facility];  Service: Neurosurgery;  Laterality: Left;  . PERIPHERAL VASCULAR INTERVENTION  11/18/2016   Procedure:  PERIPHERAL VASCULAR INTERVENTION;  Surgeon: [name], MD;  Location: [place] CV LAB;  Service: Cardiovascular;;  Left popliteal  . SHOULDER SURGERY Left 05/07/2009  . TONSILLECTOMY     Social History:  reports that he has never smoked. He has never used smokeless tobacco. He reports that he does not drink alcohol or use drugs.  Family / Support Systems Marital Status: Married Patient Roles: Spouse, Parent Spouse/Significant Other: wife, [name] @ (C) [phone] Children: They have two adult children living in [place], [name] and [place] Family Dynamics: Pt notes wife is very supportive and able to assist.  Social History Preferred language: English Religion: Baptist Cultural Background: NA Education: college grad Read: Yes Write: Yes Employment Status: Retired Public relations account executive Issues: None Guardian/Conservator: None - per MD, pt is capable of making decisions on his own behalf.   Abuse/Neglect Abuse/Neglect Assessment Can Be Completed: Yes Physical Abuse: Denies Verbal Abuse: Denies Sexual Abuse: Denies Exploitation of patient/patient's resources: Denies Self-Neglect: Denies Possible abuse reported to:: Other (Comment)  Emotional Status Pt's affect, behavior and adjustment status: Pt very pleasant, talkative and completes assessment interview without difficulty.  He does become a little tearful when he talks about his eagerness to go home and states, "there's no place like home.Marland KitchenMarland KitchenMarland KitchenI'm gonna do everything I can here to get home."  Will monitor mood and refer for neuropsychology as indicated.  May benefit from amputee support group at d/c. Recent Psychosocial Issues: None Psychiatric History: None Substance Abuse History: None  Patient / Family Perceptions, Expectations & Goals Pt/Family understanding of illness & functional limitations: Pt and wife with good understanding of chronic medical issues which led to need for AKA and states he "knew it was  coming..." Premorbid pt/family roles/activities: Pt was mostly independent but declining mobility due to pain. Anticipated changes in roles/activities/participation: Wife prepared to continue to caregiver support role. Pt/family expectations/goals: "I just hope I am home be the end of the week."  [employer]: None Premorbid Home Care/DME Agencies: Other (Comment)([facility] has followed in past.) Transportation available at discharge: yes Resource referrals recommended: Support group (specify)  Discharge Planning Living Arrangements: Spouse/significant other Support Systems: Spouse/significant other, Children, Friends/neighbors, Church/faith community Type of Residence: Private residence Insurance Resources: [employer], Multimedia programmer (specify)([employer]) Financial Resources: Radio broadcast assistant Screen Referred: No Living Expenses: Own Money Management: Patient Does the patient have any problems obtaining your medications?: No Home Management: pt and wife Patient/Family Preliminary Plans: Pt to d/c home with wife who will provide primary support. Social Work Anticipated Follow Up Needs: HH/OP, Support Group Expected length of stay: 7-10 days  Clinical Impression Very pleasant gentleman  here following AKA and making good gains.  A little tearful as he talks about wanting to be home ASAP.  He denies any significant distress about having had the amputation and is eager to be fitted with prosthesis.  Will follow for support and d/c planning needs.  [name] 02/11/2018, 3:32 PM

## 2018-02-12 ENCOUNTER — Inpatient Hospital Stay (HOSPITAL_COMMUNITY): Payer: No Typology Code available for payment source

## 2018-02-12 ENCOUNTER — Inpatient Hospital Stay (HOSPITAL_COMMUNITY): Payer: No Typology Code available for payment source | Admitting: Occupational Therapy

## 2018-02-12 DIAGNOSIS — N289 Disorder of kidney and ureter, unspecified: Secondary | ICD-10-CM

## 2018-02-12 DIAGNOSIS — S78119A Complete traumatic amputation at level between unspecified hip and knee, initial encounter: Secondary | ICD-10-CM

## 2018-02-12 DIAGNOSIS — N189 Chronic kidney disease, unspecified: Secondary | ICD-10-CM

## 2018-02-12 LAB — TYPE AND SCREEN
ABO/RH(D): A POS
Antibody Screen: NEGATIVE
Unit division: 0
Unit division: 0
Unit division: 0

## 2018-02-12 LAB — BPAM RBC
Blood Product Expiration Date: 202001242359
Blood Product Expiration Date: 202001242359
Blood Product Expiration Date: 202001242359
ISSUE DATE / TIME: 201912311548
ISSUE DATE / TIME: 202001010031
ISSUE DATE / TIME: 202001010342
UNIT TYPE AND RH: 6200
UNIT TYPE AND RH: 6200
Unit Type and Rh: 6200

## 2018-02-12 LAB — GLUCOSE, CAPILLARY
Glucose-Capillary: 105 mg/dL — ABNORMAL HIGH (ref 70–99)
Glucose-Capillary: 115 mg/dL — ABNORMAL HIGH (ref 70–99)
Glucose-Capillary: 142 mg/dL — ABNORMAL HIGH (ref 70–99)
Glucose-Capillary: 127 mg/dL — ABNORMAL HIGH (ref 70–99)

## 2018-02-12 LAB — URINE CULTURE: Culture: NO GROWTH

## 2018-02-12 NOTE — Progress Notes (Signed)
Physical Therapy Session Note  Patient Details  Name: [name] MRN: [id number] Date of Birth: September 08, 1940  Today's Date: 02/12/2018 PT Individual Time: 0800-0858 PT Individual Time Calculation (min): 58 min   Short Term Goals: Week 1:  PT Short Term Goal 1 (Week 1): =LTG due to ELOS  Skilled Therapeutic Interventions/Progress Updates:    Pt supine in bed upon PT arrival, agreeable to therapy tx and reports pain 5/10 in R residual limb. Pt transferred to sitting EOB with min assist and transferred to w/c min assist. Pt propelled to gym x 150 ft with supervision using B UEs. Pt performed lateral scoot to mat with min assist. Pt performed x 3 sit<>stands from mat this session with min-mod assist. In standing pt performed pre-gait with RW stepping forward/backward in place with L LE, min assist. Pt performed x 2 stand pivot transfers with RW and min assist, verbal cues for techniques. Pt noted to be incontinent of bladder. Transported back to room and performed sit<>stands with RW to perform peri care and change clothing. Pt transported back to gym, lateral scoot to mat with min assist and transferred to supine with supervision. Pt performed therex for LE strengthening, 2 x 10 each including SLR, hip abduction, L heel slides, L bridges. Pt transferred to sitting with min assist and transferred to w/c min assist. Propelled back to room and transferred to bed, left seated EOB in care of RN.   Therapy Documentation Precautions:  Precautions Precautions: Fall Precaution Comments: Rt AKA Restrictions Weight Bearing Restrictions: Yes RLE Weight Bearing: Non weight bearing    Therapy/Group: Individual Therapy  Netta Corrigan, PT, DPT 02/12/2018, 7:51 AM

## 2018-02-12 NOTE — Progress Notes (Signed)
Occupational Therapy Session Note  Patient Details  Name: [name] MRN: [id number] Date of Birth: [birth year]  Today's Date: 02/12/2018 OT Individual Time: 1700-1749 and 1300-1357 OT Individual Time Calculation (min): 72 min and 57 min    Short Term Goals: Week 1:  OT Short Term Goal 1 (Week 1): STGs=LTGs due to ELOS  Skilled Therapeutic Interventions/Progress Updates:    Session 1:  Upon entering the room, pt supine in bed with no c/o pain and agreeable to OT intervention. Pt requesting to shower this session. OT removing shrinker and covering R residual limb for shower. Lateral scoot transfer with min A into wheelchair > onto TTB in same manner with use of grab bar. Pt remained seated during shower and utilized lateral lean to wash buttocks but needed assistance to wash L foot. Pt returning to wheelchair in same manner and propelled self in front of sink for grooming tasks with set up A to obtain all needed items. Pt returning to bed to don LB clothing by rolling L <> R but needing assistance to don shrinker this session. Pt able to call wife and obtain photo of bathroom set up and OT recommended shower chair for home as built in seat is not accessible. Pt verbalized understanding and will practice at next available time.   Session 2: Upon entering the room, pt supine in bed with c/o fatigue but agreeable to B UE strengthening from bed level. OT provided pt with paper handout for theraband HEP. OT demonstrated and reviewed exercises with pt returning demonstrations and needing min cuing for proper technique. Pt performed 3 sets of 10 chest pulls, shoulder flexion, shoulder diagonals, alternating punches, and elbow flexion with use of green resistive theraband. Pt taking rest breaks as needed. Caregivers arrived in room and OT provided pt with energy conservation education handouts and began discussion on use of this skill for home and community in order to remain safety at discharge. Education to  continue on this topic. Call bell and all needed items within reach upon exiting the room.   Therapy Documentation Precautions:  Precautions Precautions: Fall Precaution Comments: Rt AKA Restrictions Weight Bearing Restrictions: Yes RLE Weight Bearing: Non weight bearing Pain: Pain Assessment Pain Scale: 0-10 Pain Score: 2  Pain Type: Surgical pain Pain Location: Leg Pain Orientation: Right Pain Descriptors / Indicators: Aching Pain Frequency: Constant Pain Onset: On-going Pain Intervention(s): Medication (See eMAR) ADL: ADL Eating: Not assessed Grooming: Setup Where Assessed-Grooming: Wheelchair, Sitting at sink Upper Body Bathing: Supervision/safety Where Assessed-Upper Body Bathing: Edge of bed Lower Body Bathing: Contact guard Where Assessed-Lower Body Bathing: Bed level, Edge of bed Upper Body Dressing: Setup Where Assessed-Upper Body Dressing: Edge of bed Lower Body Dressing: Minimal assistance(for donning Rt shrinker) Where Assessed-Lower Body Dressing: Edge of bed Toileting: Not assessed Gaffer Transfer: Not assessed   Therapy/Group: Individual Therapy  Gypsy Decant 02/12/2018, 12:37 PM

## 2018-02-12 NOTE — Progress Notes (Signed)
[facility] PHYSICAL MEDICINE & REHABILITATION PROGRESS NOTE   Subjective/Complaints: Patient states that he has no significant stump pain or phantom pain.  ROS: Patient denies chest pain, shortness of breath, nausea vomiting diarrhea constipation Objective:   No results found. Recent Labs    02/10/18 0258 02/11/18 0644  WBC 13.1* 8.8  HGB 8.4* 9.2*  HCT 25.1* 28.0*  PLT 216 261   Recent Labs    02/10/18 0801 02/11/18 0644  NA 139 138  K 3.8 3.4*  CL 103 105  CO2 25 25  GLUCOSE 115* 97  BUN 18 14  CREATININE 1.87* 1.62*  CALCIUM 8.8* 8.4*    Intake/Output Summary (Last 24 hours) at 02/12/2018 1208 Last data filed at 02/12/2018 0753 Gross per 24 hour  Intake 720 ml  Output 525 ml  Net 195 ml     Physical Exam: Vital Signs Blood pressure (!) 141/71, pulse 93, temperature 98.1 F (36.7 C), temperature source Oral, resp. rate 14, height 5\' 7"  (1.702 m), weight 71.1 kg, SpO2 99 %. Constitutional: No distress . Vital signs reviewed. HEENT: EOMI, oral membranes moist Neck: supple Cardiovascular: RRR without murmur. No JVD    Respiratory: CTA Bilaterally without wheezes or rales. Normal effort    GI: BS +, non-tender, non-distended  Musculoskeletal:  General: Edemapresent. . Comments: Min edema R-AKA. Left leg non-tender Neurological: He is alertand oriented to person, place, and time. Nocranial nerve deficit. UE 5/5. RLE: 3/5 HF,  LLE 4/5 prox to distal. Skin: Skin iswarm. Noerythema.  Psychiatric: pleasant and appropriate.    Assessment/Plan: 1. Functional deficits secondary to right aka which require 3+ hours per day of interdisciplinary therapy in a comprehensive inpatient rehab setting.  Physiatrist is providing close team supervision and 24 hour management of active medical problems listed below.  Physiatrist and rehab team continue to assess barriers to discharge/monitor patient progress toward functional and medical goals  Care  Tool:  Bathing    Body parts bathed by patient: Right arm, Left arm, Chest, Abdomen, Front perineal area, Buttocks, Right upper leg, Left upper leg, Left lower leg, Face     Body parts n/a: Right lower leg   Bathing assist Assist Level: Contact Guard/Touching assist     Upper Body Dressing/Undressing Upper body dressing   What is the patient wearing?: Pull over shirt    Upper body assist Assist Level: Set up assist    Lower Body Dressing/Undressing Lower body dressing      What is the patient wearing?: Pants, Ace wrap/stump shrinker     Lower body assist Assist for lower body dressing: Minimal Assistance - Patient > 75%     Toileting Toileting    Toileting assist Assist for toileting: Moderate Assistance - Patient 50 - 74% Assistive Device Comment: (urinal patient spills requiring pad change)   Transfers Chair/bed transfer  Transfers assist     Chair/bed transfer assist level: Minimal Assistance - Patient > 75%     Locomotion Ambulation   Ambulation assist      Assist level: Minimal Assistance - Patient > 75% Assistive device: Walker-rolling Max distance: 5'   Walk 10 feet activity   Assist  Walk 10 feet activity did not occur: Safety/medical concerns        Walk 50 feet activity   Assist Walk 50 feet with 2 turns activity did not occur: Safety/medical concerns         Walk 150 feet activity   Assist Walk 150 feet activity did not occur: Safety/medical  concerns         Walk 10 feet on uneven surface  activity   Assist Walk 10 feet on uneven surfaces activity did not occur: Safety/medical concerns         Wheelchair     Assist Will patient use wheelchair at discharge?: Yes Type of Wheelchair: Manual    Wheelchair assist level: Supervision/Verbal cueing Max wheelchair distance: 150 ft    Wheelchair 50 feet with 2 turns activity    Assist        Assist Level: Supervision/Verbal cueing   Wheelchair 150  feet activity     Assist Wheelchair 150 feet activity did not occur: Safety/medical concerns   Assist Level: Supervision/Verbal cueing    Medical Problem List and Plan: 1.Functional deficitssecondary to PAD, right AKA. -CIR PT OT -prosthetic education 2.PE/DVT Anticoagulation:Pharmaceutical:Other (comment)Eliquis resumed today. Monitor hemoglobin check CBC on 02/14/2018 3. Pain Management:Oxycodone prn effective. 4. Mood:LCSW to follow for evaluation and support 5. Neuropsych: This patientiscapable of making decisions on hisown behalf. 6. Skin/Wound Care:  -Continue protein supplement.  -stump sock---shrinker next week 7. Fluids/Electrolytes/Nutrition:encourage PO  -supplement potassium 8.Acute on chronic CKD:Monitor with serial check. SCr improving.1.82--->1.62 1/3 Monitor creatinine on weekly basis 9. XH:BZJIRCV on mestinon andchronic steriods. 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 Frequency: still voiding frequently  -likely lasix related- dose was reduced to 20mg  prior to admit  -check ua/ucx  -awaiting PVR check 12. Acute on chronic anemia: Continue to monitor. 13. Leucocytosis: Resolved 14. HTN: Monitor BP bid--continue Lasix and Imdur.  Vitals:   02/11/18 2159 02/12/18 0346  BP: 107/62 (!) 141/71  Pulse: 84 93  Resp: 14 14  Temp: 98.2 F (36.8 C) 98.1 F (36.7 C)  SpO2: 97% 99%  Some lability, overall adequate control, monitor for orthostatic symptoms 15. Insomnia: Trazodone added to help with sleep wake cycle.  16. PVD s/p stenting: On Plavix and Crestor.    LOS: 2 days A FACE TO FACE EVALUATION WAS PERFORMED  Charlett Blake 02/12/2018, 12:08 PM

## 2018-02-13 ENCOUNTER — Inpatient Hospital Stay (HOSPITAL_COMMUNITY): Payer: No Typology Code available for payment source | Admitting: Occupational Therapy

## 2018-02-13 DIAGNOSIS — Z8673 Personal history of transient ischemic attack (TIA), and cerebral infarction without residual deficits: Secondary | ICD-10-CM

## 2018-02-13 LAB — GLUCOSE, CAPILLARY
Glucose-Capillary: 103 mg/dL — ABNORMAL HIGH (ref 70–99)
Glucose-Capillary: 137 mg/dL — ABNORMAL HIGH (ref 70–99)
Glucose-Capillary: 158 mg/dL — ABNORMAL HIGH (ref 70–99)
Glucose-Capillary: 112 mg/dL — ABNORMAL HIGH (ref 70–99)

## 2018-02-13 NOTE — Plan of Care (Signed)
  Problem: Consults Goal: RH LIMB LOSS PATIENT EDUCATION Description Description: See Patient Education module for eduction specifics. Outcome: Progressing Goal: Skin Care Protocol Initiated - if Braden Score 18 or less Description If consults are not indicated, leave blank or document N/A Outcome: Progressing Goal: Nutrition Consult-if indicated Outcome: Progressing Goal: Diabetes Guidelines if Diabetic/Glucose > 140 Description If diabetic or lab glucose is > 140 mg/dl - Initiate Diabetes/Hyperglycemia Guidelines & Document Interventions  Outcome: Progressing   Problem: RH BOWEL ELIMINATION Goal: RH STG MANAGE BOWEL WITH ASSISTANCE Description STG Manage Bowel with  Min Assistance.  Outcome: Progressing Goal: RH STG MANAGE BOWEL W/MEDICATION W/ASSISTANCE Description STG Manage Bowel with Medication with min  Assistance.  Outcome: Progressing   Problem: RH BLADDER ELIMINATION Goal: RH STG MANAGE BLADDER WITH ASSISTANCE Description STG Manage Bladder With min  Assistance  Outcome: Progressing   Problem: RH SKIN INTEGRITY Goal: RH STG SKIN FREE OF INFECTION/BREAKDOWN Description Skin will be free of skin infection/ breakdown entire stay on rtehab  Outcome: Progressing Goal: RH STG MAINTAIN SKIN INTEGRITY WITH ASSISTANCE Description STG Maintain Skin Integrity With Assistance. Outcome: Progressing   Problem: RH SAFETY Goal: RH STG ADHERE TO SAFETY PRECAUTIONS W/ASSISTANCE/DEVICE Description STG Adhere to Safety Precautions With min  Assistance/Device.  Outcome: Progressing   Problem: RH PAIN MANAGEMENT Goal: RH STG PAIN MANAGED AT OR BELOW PT'S PAIN GOAL Description Pain less than 2  Outcome: Progressing   Problem: RH KNOWLEDGE DEFICIT LIMB LOSS Goal: RH STG INCREASE KNOWLEDGE OF SELF CARE AFTER LIMB LOSS Description Pt will increase knowledge re limb loss; min assist  Outcome: Progressing

## 2018-02-13 NOTE — Progress Notes (Signed)
[place] PHYSICAL MEDICINE & REHABILITATION PROGRESS NOTE   Subjective/Complaints: Slept well last night, denies any new issues going on today.  ROS: Patient denies chest pain, shortness of breath, nausea vomiting diarrhea constipation Objective:   No results found. Recent Labs    02/11/18 0644  WBC 8.8  HGB 9.2*  HCT 28.0*  PLT 261   Recent Labs    02/11/18 0644  NA 138  K 3.4*  CL 105  CO2 25  GLUCOSE 97  BUN 14  CREATININE 1.62*  CALCIUM 8.4*    Intake/Output Summary (Last 24 hours) at 02/13/2018 1119 Last data filed at 02/13/2018 0742 Gross per 24 hour  Intake 660 ml  Output 675 ml  Net -15 ml     Physical Exam: Vital Signs Blood pressure (!) 157/73, pulse 92, temperature 98.7 F (37.1 C), resp. rate 19, height 5\' 7"  (1.702 m), weight 71.1 kg, SpO2 99 %. Constitutional: No distress . Vital signs reviewed. HEENT: EOMI, oral membranes moist Neck: supple Cardiovascular: RRR without murmur. No JVD    Respiratory: CTA Bilaterally without wheezes or rales. Normal effort    GI: BS +, non-tender, non-distended  Musculoskeletal:  General: Edemapresent. . Comments: Min edema R-AKA. Left leg non-tender Neurological: He is alertand oriented to person, place, and time. Nocranial nerve deficit. UE 5/5. RLE: 3/5 HF,  LLE 4/5 prox to distal. Skin: Skin iswarm. Noerythema.  Psychiatric: pleasant and appropriate.    Assessment/Plan: 1. Functional deficits secondary to right aka which require 3+ hours per day of interdisciplinary therapy in a comprehensive inpatient rehab setting.  Physiatrist is providing close team supervision and 24 hour management of active medical problems listed below.  Physiatrist and rehab team continue to assess barriers to discharge/monitor patient progress toward functional and medical goals  Care Tool:  Bathing    Body parts bathed by patient: Right arm, Left arm, Chest, Abdomen, Front perineal area, Buttocks,  Right upper leg, Left upper leg, Face   Body parts bathed by helper: Left lower leg Body parts n/a: Right lower leg   Bathing assist Assist Level: Minimal Assistance - Patient > 75%     Upper Body Dressing/Undressing Upper body dressing   What is the patient wearing?: Pull over shirt    Upper body assist Assist Level: Set up assist    Lower Body Dressing/Undressing Lower body dressing      What is the patient wearing?: Pants, Ace wrap/stump shrinker     Lower body assist Assist for lower body dressing: Minimal Assistance - Patient > 75%     Toileting Toileting    Toileting assist Assist for toileting: Minimal Assistance - Patient > 75% Assistive Device Comment: (urinal patient spills requiring pad change)   Transfers Chair/bed transfer  Transfers assist     Chair/bed transfer assist level: Minimal Assistance - Patient > 75%     Locomotion Ambulation   Ambulation assist      Assist level: Minimal Assistance - Patient > 75% Assistive device: Walker-rolling Max distance: 5'   Walk 10 feet activity   Assist  Walk 10 feet activity did not occur: Safety/medical concerns        Walk 50 feet activity   Assist Walk 50 feet with 2 turns activity did not occur: Safety/medical concerns         Walk 150 feet activity   Assist Walk 150 feet activity did not occur: Safety/medical concerns         Walk 10 feet on uneven surface  activity   Assist Walk 10 feet on uneven surfaces activity did not occur: Safety/medical concerns         Wheelchair     Assist Will patient use wheelchair at discharge?: Yes Type of Wheelchair: Manual    Wheelchair assist level: Supervision/Verbal cueing Max wheelchair distance: 150 ft    Wheelchair 50 feet with 2 turns activity    Assist        Assist Level: Supervision/Verbal cueing   Wheelchair 150 feet activity     Assist Wheelchair 150 feet activity did not occur: Safety/medical  concerns   Assist Level: Supervision/Verbal cueing    Medical Problem List and Plan: 1.Functional deficitssecondary to PAD, right AKA. -CIR PT OT -prosthetic education 2.PE/DVT Anticoagulation:Pharmaceutical:Other (comment)Eliquis resumed today. Monitor hemoglobin check CBC on 02/14/2018 3. Pain Management:Oxycodone prn effective. 4. Mood:LCSW to follow for evaluation and support 5. Neuropsych: This patientiscapable of making decisions on hisown behalf. 6. Skin/Wound Care:  -Continue protein supplement.  -stump sock---shrinker next week 7. Fluids/Electrolytes/Nutrition:encourage PO  -supplement potassium 8.Acute on chronic CKD:Monitor with serial check. SCr improving.1.82--->1.62 1/3 Monitor creatinine on weekly basis 9. ER:DEYCXKG on mestinon andchronic steriods. 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 Frequency: still voiding frequently  -likely lasix related- dose was reduced to 20mg  prior to admit  -check ua/ucx  -awaiting PVR check 12. Acute on chronic anemia: Continue to monitor. 13. Leucocytosis: Resolved 14. HTN: Monitor BP bid--continue Lasix and Imdur.  Vitals:   02/12/18 2028 02/13/18 0626  BP: 133/70 (!) 157/73  Pulse: 88 92  Resp: 18 19  Temp: 97.9 F (36.6 C) 98.7 F (37.1 C)  SpO2: 98% 99%  Some lability, overall adequate control, no orthostatic symptoms 15. Insomnia: Trazodone added to help with sleep wake cycle.  16. PVD s/p stenting: On Plavix and Crestor.    LOS: 3 days A FACE TO FACE EVALUATION WAS PERFORMED  Charlett Blake 02/13/2018, 11:19 AM

## 2018-02-13 NOTE — Progress Notes (Signed)
Occupational Therapy Session Note  Patient Details  Name: REMIGIO MCMILLON MRN: [id number] Date of Birth: 1940-12-07  Today's Date: 02/13/2018 OT Group Time: 1100-1200 OT Group Time Calculation (min): 60 min  Skilled Therapeutic Interventions/Progress Updates:    Pt engaged in therapeutic w/c level dance group focusing on patient choice, UE/LE strengthening, salience, activity tolerance, and social participation. Pt was guided through various dance-based exercises involving UEs/LEs and trunk. All music was selected by group members. Emphasis placed on general strengthening and endurance. Pt cheerfully engaged and socially involved throughout tx, also requested songs to hear. Spouse present and also participating in group. At end of tx pt self propelled back to room with spouse.    Therapy Documentation Precautions:  Precautions Precautions: Fall Precaution Comments: Rt AKA Restrictions Weight Bearing Restrictions: Yes RLE Weight Bearing: Non weight bearing Pain: No s/s pain during session    ADL: ADL Eating: Not assessed Grooming: Setup Where Assessed-Grooming: Wheelchair, Sitting at sink Upper Body Bathing: Supervision/safety Where Assessed-Upper Body Bathing: Edge of bed Lower Body Bathing: Contact guard Where Assessed-Lower Body Bathing: Bed level, Edge of bed Upper Body Dressing: Setup Where Assessed-Upper Body Dressing: Edge of bed Lower Body Dressing: Minimal assistance(for donning Rt shrinker) Where Assessed-Lower Body Dressing: Edge of bed Toileting: Not assessed Gaffer Transfer: Not assessed      Therapy/Group: Group Therapy  Iracema Lanagan A Marylan Glore 02/13/2018, 12:51 PM

## 2018-02-14 ENCOUNTER — Inpatient Hospital Stay (HOSPITAL_COMMUNITY): Payer: No Typology Code available for payment source | Admitting: Occupational Therapy

## 2018-02-14 ENCOUNTER — Inpatient Hospital Stay (HOSPITAL_COMMUNITY): Payer: No Typology Code available for payment source | Admitting: Physical Therapy

## 2018-02-14 LAB — GLUCOSE, CAPILLARY
Glucose-Capillary: 99 mg/dL (ref 70–99)
Glucose-Capillary: 119 mg/dL — ABNORMAL HIGH (ref 70–99)
Glucose-Capillary: 143 mg/dL — ABNORMAL HIGH (ref 70–99)
Glucose-Capillary: 108 mg/dL — ABNORMAL HIGH (ref 70–99)

## 2018-02-14 NOTE — Progress Notes (Signed)
[place] PHYSICAL MEDICINE & REHABILITATION PROGRESS NOTE   Subjective/Complaints: Up in chair. Happy with progress. Voices hope to be home by weekend. Became tearful. Having occasional stump/phantom pain  ROS: Patient denies fever, rash, sore throat, blurred vision, nausea, vomiting, diarrhea, cough, shortness of breath or chest pain, j  back pain, headache, or mood change.    Objective:   No results found. No results for input(s): WBC, HGB, HCT, PLT in the last 72 hours. No results for input(s): NA, K, CL, CO2, GLUCOSE, BUN, CREATININE, CALCIUM in the last 72 hours.  Intake/Output Summary (Last 24 hours) at 02/14/2018 0834 Last data filed at 02/14/2018 0802 Gross per 24 hour  Intake 480 ml  Output 350 ml  Net 130 ml     Physical Exam: Vital Signs Blood pressure (!) 125/54, pulse 81, temperature 97.9 F (36.6 C), temperature source Oral, resp. rate 17, height 5\' 7"  (1.702 m), weight 71.1 kg, SpO2 99 %. Constitutional: No distress . Vital signs reviewed. HEENT: EOMI, oral membranes moist Neck: supple Cardiovascular: RRR without murmur. No JVD    Respiratory: CTA Bilaterally without wheezes or rales. Normal effort    GI: BS +, non-tender, non-distended   Musculoskeletal:  General: Edemapresent. . Comments: Min edema R-AKA. Left leg non-tender Neurological: He is alertand oriented to person, place, and time. Nocranial nerve deficit. UE 5/5. RLE: 3/5 HF,  LLE 4/5 prox to distal. Skin: Skin iswarm. Incision CDI Psychiatric: pleasant and appropriate.    Assessment/Plan: 1. Functional deficits secondary to right aka which require 3+ hours per day of interdisciplinary therapy in a comprehensive inpatient rehab setting.  Physiatrist is providing close team supervision and 24 hour management of active medical problems listed below.  Physiatrist and rehab team continue to assess barriers to discharge/monitor patient progress toward functional and medical  goals  Care Tool:  Bathing    Body parts bathed by patient: Right arm, Left arm, Chest, Abdomen, Front perineal area, Buttocks, Right upper leg, Left upper leg, Face   Body parts bathed by helper: Left lower leg Body parts n/a: Right lower leg   Bathing assist Assist Level: Minimal Assistance - Patient > 75%     Upper Body Dressing/Undressing Upper body dressing   What is the patient wearing?: Pull over shirt    Upper body assist Assist Level: Set up assist    Lower Body Dressing/Undressing Lower body dressing      What is the patient wearing?: Pants, Ace wrap/stump shrinker     Lower body assist Assist for lower body dressing: Minimal Assistance - Patient > 75%     Toileting Toileting    Toileting assist Assist for toileting: Minimal Assistance - Patient > 75% Assistive Device Comment: (urinal patient spills requiring pad change)   Transfers Chair/bed transfer  Transfers assist     Chair/bed transfer assist level: Minimal Assistance - Patient > 75%     Locomotion Ambulation   Ambulation assist      Assist level: Minimal Assistance - Patient > 75% Assistive device: Walker-rolling Max distance: 5'   Walk 10 feet activity   Assist  Walk 10 feet activity did not occur: Safety/medical concerns        Walk 50 feet activity   Assist Walk 50 feet with 2 turns activity did not occur: Safety/medical concerns         Walk 150 feet activity   Assist Walk 150 feet activity did not occur: Safety/medical concerns  Walk 10 feet on uneven surface  activity   Assist Walk 10 feet on uneven surfaces activity did not occur: Safety/medical concerns         Wheelchair     Assist Will patient use wheelchair at discharge?: Yes Type of Wheelchair: Manual    Wheelchair assist level: Supervision/Verbal cueing Max wheelchair distance: 150 ft    Wheelchair 50 feet with 2 turns activity    Assist        Assist Level:  Supervision/Verbal cueing   Wheelchair 150 feet activity     Assist Wheelchair 150 feet activity did not occur: Safety/medical concerns   Assist Level: Supervision/Verbal cueing    Medical Problem List and Plan: 1.Functional deficitssecondary to PAD, right AKA. -CIR PT OT -prosthetic education  -[employer] for stump shrinker 2.PE/DVT Anticoagulation:Pharmaceutical:Other (comment)Eliquis resumed today. Monitor hemoglobin check CBC on 02/14/2018 3. Pain Management:Oxycodone prn effective. 4. Mood:LCSW to follow for evaluation and support 5. Neuropsych: This patientiscapable of making decisions on hisown behalf. 6. Skin/Wound Care:  -Continue protein supplement.  -shrinker 7. Fluids/Electrolytes/Nutrition:encourage PO  -supplement potassium 8.Acute on chronic CKD:Monitor with serial check. SCr improving.1.82--->1.62 1/3 Monitor creatinine on weekly basis 9. PP:IRJJOAC on mestinon andchronic steriods. 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 Frequency: still voiding frequently  -likely lasix related- dose was reduced to 20mg  prior to admit  - ua/ucx negative  -scans low 12. Acute on chronic anemia: Continue to monitor. 13. Leucocytosis: Resolved 14. HTN: Monitor BP bid--continue Lasix and Imdur.  Vitals:   02/13/18 1942 02/14/18 0432  BP: (!) 156/66 (!) 125/54  Pulse: 87 81  Resp: 16 17  Temp: 98.7 F (37.1 C) 97.9 F (36.6 C)  SpO2: 99% 99%  reasonable control 15. Insomnia: Trazodone added to help with sleep wake cycle.  16. PVD s/p stenting: On Plavix and Crestor.    LOS: 4 days A FACE TO [place] 02/14/2018, 8:34 AM

## 2018-02-14 NOTE — Plan of Care (Signed)
  Problem: Consults Goal: RH LIMB LOSS PATIENT EDUCATION Description Description: See Patient Education module for eduction specifics. Outcome: Progressing Goal: Skin Care Protocol Initiated - if Braden Score 18 or less Description If consults are not indicated, leave blank or document N/A Outcome: Progressing Goal: Nutrition Consult-if indicated Outcome: Progressing Goal: Diabetes Guidelines if Diabetic/Glucose > 140 Description If diabetic or lab glucose is > 140 mg/dl - Initiate Diabetes/Hyperglycemia Guidelines & Document Interventions  Outcome: Progressing   Problem: RH BOWEL ELIMINATION Goal: RH STG MANAGE BOWEL WITH ASSISTANCE Description STG Manage Bowel with  Min Assistance.  Outcome: Progressing Flowsheets (Taken 02/14/2018 1434) STG: Pt will manage bowels with assistance: 4-Minimum assistance Goal: RH STG MANAGE BOWEL W/MEDICATION W/ASSISTANCE Description STG Manage Bowel with Medication with min  Assistance.  Outcome: Progressing   Problem: RH BLADDER ELIMINATION Goal: RH STG MANAGE BLADDER WITH ASSISTANCE Description STG Manage Bladder With min  Assistance  Outcome: Progressing Flowsheets (Taken 02/14/2018 1434) STG: Pt will manage bladder with assistance: 5-Supervision/cueing   Problem: RH SKIN INTEGRITY Goal: RH STG SKIN FREE OF INFECTION/BREAKDOWN Description Skin will be free of skin infection/ breakdown entire stay on rtehab  Outcome: Progressing Goal: RH STG MAINTAIN SKIN INTEGRITY WITH ASSISTANCE Description STG Maintain Skin Integrity With Assistance. Outcome: Progressing Flowsheets (Taken 02/14/2018 1434) STG: Maintain skin integrity with assistance: 3-Moderate assistance   Problem: RH SAFETY Goal: RH STG ADHERE TO SAFETY PRECAUTIONS W/ASSISTANCE/DEVICE Description STG Adhere to Safety Precautions With min  Assistance/Device.  Outcome: Progressing Flowsheets (Taken 02/14/2018 1434) STG:Pt will adhere to safety precautions with assistance/device:  4-Minimal assistance   Problem: RH PAIN MANAGEMENT Goal: RH STG PAIN MANAGED AT OR BELOW PT'S PAIN GOAL Description Pain less than 2  Outcome: Progressing   Problem: RH KNOWLEDGE DEFICIT LIMB LOSS Goal: RH STG INCREASE KNOWLEDGE OF SELF CARE AFTER LIMB LOSS Description Pt will increase knowledge re limb loss; min assist  Outcome: Progressing

## 2018-02-14 NOTE — Care Management (Signed)
Inpatient Rehabilitation Center Individual Statement of Services  Patient Name:  KHOLE BRANCH  Date:  02/14/2018  Welcome to the [facility].  Our goal is to provide you with an individualized program based on your diagnosis and situation, designed to meet your specific needs.  With this comprehensive rehabilitation program, you will be expected to participate in at least 3 hours of rehabilitation therapies Monday-Friday, with modified therapy programming on the weekends.  Your rehabilitation program will include the following services:  Physical Therapy (PT), Occupational Therapy (OT), 24 hour per day rehabilitation nursing, Case Management (Social Worker), Rehabilitation Medicine, Nutrition Services and Pharmacy Services  Weekly team conferences will be held on Tuesdays to discuss your progress.  Your Social Worker will talk with you frequently to get your input and to update you on team discussions.  Team conferences with you and your family in attendance may also be held.  Expected length of stay: 7-10 days   Overall anticipated outcome: supervision  Depending on your progress and recovery, your program may change. Your Social Worker will coordinate services and will keep you informed of any changes. Your Social Worker's name and contact numbers are listed  below.  The following services may also be recommended but are not provided by the [facility] will be made to provide these services after discharge if needed.  Arrangements include referral to agencies that provide these services.  Your insurance has been verified to be:  Medicare; [employer] Choice Your primary doctor is:  Ethelene Hal  Pertinent information will be shared with your doctor and your insurance company.  Social Worker:  [place], [place] or ([phone]   Information discussed with and copy given to patient by: Lennart Pall, 02/14/2018, 3:32 PM

## 2018-02-14 NOTE — Progress Notes (Signed)
Physical Therapy Session Note  Patient Details  Name: [name] MRN: [id number] Date of Birth: [birth year]  Today's Date: 02/14/2018 PT Individual Time: 0800-0900; 1135-1200; 1400-1500 PT Individual Time Calculation (min): 60 min, 25 min, 60 min  Short Term Goals: Week 1:  PT Short Term Goal 1 (Week 1): =LTG due to ELOS  Skilled Therapeutic Interventions/Progress Updates:    Session 1: Pt received seated in w/c in room, agreeable to PT. Pt reports some pain in R residual limb at rest, not rated and declines any intervention. Manual w/c propulsion x 150 ft with BUE and Supervision, increased time needed. Switched pt's w/c out for narrower size (16" vs 18") for improved UB biomechanics with w/c propulsion and improved fit for patient. Lateral scoot transfer w/c to/from mat table with CGA to min A. Min A to scoot up onto 3" step on therapy mat to simulate scooting up stairs. Pt has difficulty clearing buttocks with step, will continue to assess best method for performing stairs at home. Sit to stand x 5 reps from elevated mat to RW with min to mod A for balance. Pt requires v/c for safe setup prior to initiating transfer. Forward/backward amb 3 x 5 ft with RW and min A for balance, focus on increasing LLE clearance with gait as well as controlling body with BUE to prevent forcefull landing on LLE. Stand pivot transfer mat table to/from w/c with RW and min A for balance. Discussed pt's home setup and doorway widths as far as a w/c fitting through, pt's spouse to provide home measurement sheet this PM. Pt left seated in bed with needs in reach and bed alarm in place at end of therapy session.  Session 2: Pt received seated in bed, agreeable to PT session. No complaints of pain. Lateral scoot transfer bed to w/c with CGA. Manual w/c propulsion x 150 ft with BUE, improved speed and biomechanics with narrower seat w/c. Pt requires min cueing for safe w/c setup prior to transfer to therapy mat. Lateral  scoot transfer with CGA. Sit to supine Supervision. Supine BLE therex x 10 reps: SLR, hip abd, bridges. Supine to sit with min A for trunk control. Lateral scoot transfer back to w/c with CGA. Pt left seated in w/c in room with needs in reach set up for lunch.  Session 3: Pt received seated in w/c in room, agreeable to PT. No complaints of pain. Pt's wife has provided home measurement sheet, pt's w/c should be wide enough to fit through all doorways of the home including bathroom doorway. Manual w/c propulsion x 150 ft with BUE and Supervision. Lateral scoot transfer w/c to/from mat table with CGA. Sit to supine Supervision. Supine to prone with min A. Prone hip flexor stretch x 10 min. Education with patient about importance of positioning to prevent hip flexor contracture if he plans on using a prosthetic in the future. Prone B hip ext x 10 reps, L knee flex x 10 reps. Pt exhibits fair tolerance for prone position due to claustrophobia. Supine to sit with min A. Sit to stand with min A to RW. Standing balance with min A while performing horseshoe toss. Pt exhibits fair standing balance with use of one UE support on RW. Education about safety in standing and to correct balance if he feels like he is falling one direction or to sit down. Pt left seated in bed in room in care of RN at end of therapy session.  Therapy Documentation Precautions:  Precautions Precautions:  Fall Precaution Comments: Rt AKA Restrictions Weight Bearing Restrictions: Yes RLE Weight Bearing: Non weight bearing    Therapy/Group: Individual Therapy  Excell Seltzer, PT, DPT  02/14/2018, 9:43 AM

## 2018-02-14 NOTE — Progress Notes (Signed)
Orthopedic Tech Progress Note Patient Details:  [name] 1940/10/16 [id number]  Patient ID: [name], male   DOB: Apr 13, 1940, 78 y.o.   MRN: [id number]   [employer] for right AKA stump shrinker.

## 2018-02-14 NOTE — Progress Notes (Signed)
Occupational Therapy Session Note  Patient Details  Name: [name] MRN: [id number] Date of Birth: 02/05/1941  Today's Date: 02/14/2018 OT Individual Time: 0109-3235 OT Individual Time Calculation (min): 74 min    Short Term Goals: Week 1:  OT Short Term Goal 1 (Week 1): STGs=LTGs due to ELOS  Skilled Therapeutic Interventions/Progress Updates:    Upon entering the room, pt supine in bed awaiting OT arrival and agreeable to therapy. Pt performing supine >sit with min A and use of grab bar. Pt performed lateral scoot transfer into wheelchair with min A. Pt propelled wheelchair 150' to ADL apartment with supervision. OT set up simulated walk in shower transfer with threshold. Pt standing with mod lifting assistance and hopping with RW to back up to threshold and sit into shower hair with min guard for safety. Pt transferring onto bed to practice supine >sit from flat surface. Pt making 4 attempts and able to perform with supervision on last attempt. Pt transferring back to wheelchair in same manner as above. Pt very emotional this session "thinking about the future". OT provided therapeutic use of self and encouraged pt. Pt performed 2 sets of 5 wheelchair push ups with rest breaks as needed. Pt propelled wheelchair back to room with supervision and transferred back into bed with min lateral scoot. Call bell and all needed items within reach upon exiting the room.   Therapy Documentation Precautions:  Precautions Precautions: Fall Precaution Comments: Rt AKA Restrictions Weight Bearing Restrictions: Yes RLE Weight Bearing: Non weight bearing Vital Signs: Therapy Vitals Temp: 98.3 F (36.8 C) Temp Source: Oral Pulse Rate: 82 Resp: 19 BP: 129/60 Patient Position (if appropriate): Lying Oxygen Therapy SpO2: 100 % O2 Device: Room Air ADL: ADL Eating: Not assessed Grooming: Setup Where Assessed-Grooming: Wheelchair, Sitting at sink Upper Body Bathing: Supervision/safety Where  Assessed-Upper Body Bathing: Edge of bed Lower Body Bathing: Contact guard Where Assessed-Lower Body Bathing: Bed level, Edge of bed Upper Body Dressing: Setup Where Assessed-Upper Body Dressing: Edge of bed Lower Body Dressing: Minimal assistance(for donning Rt shrinker) Where Assessed-Lower Body Dressing: Edge of bed Toileting: Not assessed Gaffer Transfer: Not assessed   Therapy/Group: Individual Therapy  Gypsy Decant 02/14/2018, 4:26 PM

## 2018-02-15 ENCOUNTER — Inpatient Hospital Stay (HOSPITAL_COMMUNITY): Payer: No Typology Code available for payment source | Admitting: Physical Therapy

## 2018-02-15 ENCOUNTER — Inpatient Hospital Stay (HOSPITAL_COMMUNITY): Payer: No Typology Code available for payment source | Admitting: Occupational Therapy

## 2018-02-15 ENCOUNTER — Inpatient Hospital Stay (HOSPITAL_COMMUNITY): Payer: No Typology Code available for payment source

## 2018-02-15 ENCOUNTER — Encounter (HOSPITAL_COMMUNITY): Payer: Medicare Other

## 2018-02-15 ENCOUNTER — Ambulatory Visit: Payer: Medicare Other | Admitting: Vascular Surgery

## 2018-02-15 LAB — GLUCOSE, CAPILLARY
GLUCOSE-CAPILLARY: 98 mg/dL (ref 70–99)
GLUCOSE-CAPILLARY: 111 mg/dL — AB (ref 70–99)
Glucose-Capillary: 103 mg/dL — ABNORMAL HIGH (ref 70–99)
Glucose-Capillary: 105 mg/dL — ABNORMAL HIGH (ref 70–99)

## 2018-02-15 NOTE — Progress Notes (Signed)
Physical Therapy Session Note  Patient Details  Name: [name] MRN: [id number] Date of Birth: 1940-11-13  Today's Date: 02/15/2018 PT Individual Time: 1100-1200 PT Individual Time Calculation (min): 60 min   Short Term Goals: Week 1:  PT Short Term Goal 1 (Week 1): =LTG due to ELOS  Skilled Therapeutic Interventions/Progress Updates:    Pt received seated in bed having R residual limb dressing changed with nursing. See pain details below. Supine to sit with min A from flat bed with no bedrails. Lateral scoot transfer bed to w/c with CGA. Manual w/c propulsion x 200 ft with BUE and Supervision. Sit to stand mod A from w/c to stairs. Min A x 2 for standing balance to lower onto steps. Pt is CGA to boost himself up/down stairs. Max A x 1 and min A x 1 to stand up at bottom of stairs, pt does use B handrails which he does not have available at home. Stand pivot transfer to w/c with max A due to no UE support. Discussed how difficult stairs will be for patient to perform at home and steps we will work on during therapy sessions so that pt will be able to perform stairs in a safer manner and with less assist. Pt is upset with idea that he may need to stay longer due to not being safe on stairs at this time. Squat pivot transfer w/c to/from uphill mat table with CGA to min A. Scoot up/down 4" step with min A for sitting balance x 2 reps before onset of fatigue. Assisted pt back to bed at end of therapy session. Pt left seated EOB in care of NA.  Therapy Documentation Precautions:  Precautions Precautions: Fall Precaution Comments: Rt AKA Restrictions Weight Bearing Restrictions: Yes RLE Weight Bearing: Non weight bearing Pain: Pain Assessment Pain Score: 3  Pain Type: Acute pain Pain Location: Leg Pain Orientation: Right Pain Descriptors / Indicators: Aching Pain Onset: On-going Pain Intervention(s): Other (Comment)(pt declined intervention, stated pain was tolerable)    Therapy/Group:  Individual Therapy  Excell Seltzer, PT, DPT  02/15/2018, 12:05 PM

## 2018-02-15 NOTE — Progress Notes (Signed)
PHYSICAL MEDICINE & REHABILITATION PROGRESS NOTE   Subjective/Complaints: Patient states that "he had extra therapy yesterday" which he was very happy with.  Motivated to get home this week.  ROS: Patient denies fever, rash, sore throat, blurred vision, nausea, vomiting, diarrhea, cough, shortness of breath or chest pain,  headache, or mood change.   Objective:   No results found. No results for input(s): WBC, HGB, HCT, PLT in the last 72 hours. No results for input(s): NA, K, CL, CO2, GLUCOSE, BUN, CREATININE, CALCIUM in the last 72 hours.  Intake/Output Summary (Last 24 hours) at 02/15/2018 0857 Last data filed at 02/15/2018 0636 Gross per 24 hour  Intake 444 ml  Output 325 ml  Net 119 ml     Physical Exam: Vital Signs Blood pressure 140/64, pulse 80, temperature 98.8 F (37.1 C), temperature source Oral, resp. rate 17, height 5\' 7"  (1.702 m), weight 71.1 kg, SpO2 99 %. Constitutional: No distress . Vital signs reviewed. HEENT: EOMI, oral membranes moist Neck: supple Cardiovascular: RRR without murmur. No JVD    Respiratory: CTA Bilaterally without wheezes or rales. Normal effort    GI: BS +, non-tender, non-distended  Musculoskeletal:  General: Edemapresent. . Comments: Min edema R-AKA. Left leg non-tender Neurological: He is alertand oriented to person, place, and time. Nocranial nerve deficit. UE 5/5. RLE: 4-/5 HF,  LLE 4+/5 prox to distal. Skin: Skin iswarm. Incision CDI with dry dressing Psychiatric: pleasant and appropriate.    Assessment/Plan: 1. Functional deficits secondary to right aka which require 3+ hours per day of interdisciplinary therapy in a comprehensive inpatient rehab setting.  Physiatrist is providing close team supervision and 24 hour management of active medical problems listed below.  Physiatrist and rehab team continue to assess barriers to discharge/monitor patient progress toward functional and medical  goals  Care Tool:  Bathing    Body parts bathed by patient: Right arm, Left arm, Chest, Abdomen, Front perineal area, Buttocks, Right upper leg, Left upper leg, Face   Body parts bathed by helper: Left lower leg Body parts n/a: Right lower leg   Bathing assist Assist Level: Minimal Assistance - Patient > 75%     Upper Body Dressing/Undressing Upper body dressing   What is the patient wearing?: Pull over shirt    Upper body assist Assist Level: Set up assist    Lower Body Dressing/Undressing Lower body dressing      What is the patient wearing?: Pants, Ace wrap/stump shrinker     Lower body assist Assist for lower body dressing: Minimal Assistance - Patient > 75%     Toileting Toileting    Toileting assist Assist for toileting: Minimal Assistance - Patient > 75% Assistive Device Comment: (urinal patient spills requiring pad change)   Transfers Chair/bed transfer  Transfers assist     Chair/bed transfer assist level: Minimal Assistance - Patient > 75%     Locomotion Ambulation   Ambulation assist      Assist level: Minimal Assistance - Patient > 75% Assistive device: Walker-rolling Max distance: 5'   Walk 10 feet activity   Assist  Walk 10 feet activity did not occur: Safety/medical concerns        Walk 50 feet activity   Assist Walk 50 feet with 2 turns activity did not occur: Safety/medical concerns         Walk 150 feet activity   Assist Walk 150 feet activity did not occur: Safety/medical concerns         Walk  10 feet on uneven surface  activity   Assist Walk 10 feet on uneven surfaces activity did not occur: Safety/medical concerns         Wheelchair     Assist Will patient use wheelchair at discharge?: Yes Type of Wheelchair: Manual    Wheelchair assist level: Supervision/Verbal cueing Max wheelchair distance: 150 ft    Wheelchair 50 feet with 2 turns activity    Assist        Assist Level:  Supervision/Verbal cueing   Wheelchair 150 feet activity     Assist Wheelchair 150 feet activity did not occur: Safety/medical concerns   Assist Level: Supervision/Verbal cueing    Medical Problem List and Plan: 1.Functional deficitssecondary to PAD, right AKA. -Interdisciplinary team conference today -prosthetic education  -[employer] was consulted for stump shrinker 2.PE/DVT Anticoagulation:Pharmaceutical:Other (comment)Eliquis resumed today. Monitor hemoglobin check CBC on 02/14/2018 3. Pain Management:Oxycodone prn effective. 4. Mood:LCSW to follow for evaluation and support 5. Neuropsych: This patientiscapable of making decisions on hisown behalf. 6. Skin/Wound Care:  -Continue protein supplement.  -shrinker 7. Fluids/Electrolytes/Nutrition:encourage PO  -supplement potassium 8.Acute on chronic CKD:Monitor with serial check. SCr improving.1.82--->1.62 1/3 Monitor creatinine on weekly basis 9. JS:HFWYOVZ on mestinon andchronic steriods. 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 Frequency: still voiding frequently  -likely lasix related- dose was reduced to 20mg  prior to admit  - ua/ucx negative  -scans low 12. Acute on chronic anemia: Continue to monitor. 13. Leucocytosis: Resolved 14. HTN: Monitor BP bid--continue Lasix and Imdur.  Vitals:   02/14/18 1925 02/15/18 0352  BP: 138/72 140/64  Pulse: 82 80  Resp: 18 17  Temp: 98.1 F (36.7 C) 98.8 F (37.1 C)  SpO2: 100% 99%  Blood pressure controlled 1/7 l 15. Insomnia: Trazodone added to help with sleep wake cycle.  16. PVD s/p stenting: On Plavix and Crestor.    LOS: 5 days A FACE TO FACE EVALUATION WAS PERFORMED  Meredith Staggers 02/15/2018, 8:57 AM

## 2018-02-15 NOTE — Patient Care Conference (Signed)
Inpatient RehabilitationTeam Conference and Plan of Care Update Date: 02/15/2018   Time: 2:15 PM    Patient Name: [name]      Medical Record Number: [id number]  Date of Birth: May 22, 1940 Sex: Male         Room/Bed: 4W08C/4W08C-01 Payor Info: Payor: MEDICARE / Plan: MEDICARE PART A AND B / Product Type: *No Product type* /    Admitting Diagnosis: R AKA-  Admit Date/Time:  02/10/2018  4:33 PM Admission Comments: No comment available   Primary Diagnosis:  <principal problem not specified> Principal Problem: <principal problem not specified>  Patient Active Problem List   Diagnosis Date Noted  . Unilateral AKA ([place]) 02/10/2018  . Acute blood loss anemia 02/08/2018  . Post-operative pain   . Unilateral AKA, right ([place])   . Leukocytosis   . Ischemic foot 02/05/2018  . Ischemia of right lower extremity   . Pressure injury of skin 12/27/2017  . Arterial occlusive disease   . Postoperative pain   . CKD (chronic kidney disease), stage III ([place])   . History of CVA (cerebrovascular accident)   . 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 Septic arthritis of left sternoclavicular joint ([place]) 11/03/2017  . Cervical spinal cord compression ([place]) 10/30/2017  . Nasal abscess 09/25/2017  . Immunocompromised ([place]) 09/25/2017  . Cellulitis of right axilla 09/25/2017  . Anemia 08/05/2017  . Transient ischemic attack (TIA) 07/23/2017  . Subcortical infarction ([place])   . Right hemiparesis ([place])   . Ataxia 07/21/2017  . TIA (transient ischemic attack)   . Thrombocytopenia ([place])   . Wheezing 06/17/2017  . Bursitis of left elbow 05/01/2017  . Dystonia 12/29/2016  . PAD (peripheral artery disease) ([place]) 11/18/2016  . Osteoporosis 08/28/2016  . Paronychia of great toe, left 08/10/2016  . Carotid arterial disease ([place]) 07/09/2016  . Hypocalcemia 07/09/2016  . Diplopia 07/02/2016  . S/P lumbar laminectomy 03/11/2016  . Radicular pain of left lower extremity 02/21/2016  . Cough  01/10/2016  . 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 Myasthenia gravis ([place])   . Diabetes mellitus type 2 in nonobese (H[facility])   . Coronary artery disease involving native coronary artery of native heart without angina pectoris   . Ataxia, post-stroke   . Gait disturbance, post-stroke   . Proximal leg weakness   . Cervical spondylosis without myelopathy   . Achilles tendinitis of right lower extremity   . Chronic diastolic CHF (congestive heart failure) ([place])   . Gastroesophageal reflux disease without esophagitis   . 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 Cerebellar stroke ([place])   . Ischemic stroke ([place])   . Cerebrovascular accident (CVA) due to thrombosis of left vertebral artery ([place])   . History of pulmonary embolus (PE)   . Chronic anticoagulation   . HLD (hyperlipidemia)   . Neck pain 03/19/2015  . Pain in the chest   . 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 Chronic diastolic heart failure, NYHA class 1 ([place]) 03/07/2015  . Diabetic gastroparesis ([place]) 03/07/2015  . Screening for osteoporosis 07/19/2014  . Dyspnea 04/09/2014  . Edema 04/09/2014  . Routine general medical examination at a health care facility 10/23/2013  . Bilateral carotid bruits 05/24/2013  . Abnormal CT scan, lung 05/24/2013  . Encounter for therapeutic drug monitoring 03/14/2013  . Lymphadenopathy, inguinal 01/11/2013  . AKI (acute kidney injury) ([place] [address]) 10/28/2012  . 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  Myasthenia gravis with exacerbation ([place]) 03/31/2012  . Disease of salivary gland 12/09/2011  . Left facial pain 10/28/2011  . Pulmonary embolism ([place]) 10/25/2011  . 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 GERD with stricture 09/23/2011  . Long term (current) use of anticoagulants 08/03/2011  . History of pulmonary embolism 07/30/2011  . History of DVT (deep vein thrombosis) 07/30/2011  . Subconjunctival  hemorrhage 06/22/2011  . Bilateral conjunctivitis 04/09/2011  . Diabetes mellitus type 2, controlled ([place]) 12/18/2010  . Hearing loss 08/04/2010  . Ptosis of eyelid 12/30/2009  . Essential hypertension 04/24/2009  . HYPERLIPIDEMIA, MIXED 10/05/2008  . HEMORRHOIDS-INTERNAL 07/02/2008  . HEMORRHOIDS-EXTERNAL 07/02/2008  . DIVERTICULOSIS-COLON 07/02/2008  . PERSONAL HX COLONIC POLYPS 07/02/2008  . RASH-NONVESICULAR 02/07/2008  . PNEUMONIA 12/08/2007  . Chronic myeloid leukemia in remission ([place]) 07/07/2007  . CAD (coronary artery disease) 07/07/2007  . NEPHROLITHIASIS, HX OF 07/07/2007    Expected Discharge Date: Expected Discharge Date: 02/19/18  Team Members Present: Physician leading conference: Dr. Alger Simons Social Worker Present: Lennart Pall, LCSW Nurse Present: Isla Pence, RN PT Present: Other (comment)(Taylor Ervin Knack, PT) OT Present: Other (comment)(Sandra Rosana Hoes, OT) SLP Present: Charolett Bumpers, SLP PPS Coordinator present : Gunnar Fusi     Current Status/Progress Goal Weekly Team Focus  Medical   Status post right above-knee amputation.  Pain control reasonable.  Has stump sock and dressing in place.  Fitting with shrinker this week.  Educate on prosthetic process and  Wound care, pain management, blood pressure control and nutrition   Bowel/Bladder   Continent of bowel and bladder. PVR q4-6 PVR's Low  continue to be continent and empty effectively  assess bowel and bladder needs qshift and PRN   Swallow/Nutrition/ Hydration             ADL's   mod A sit <>stand transfer with RW, min guard lateral scoot functional transfers, bathing supervision from seated position, UB self care set up, LB self care min A  mod I overall wheelchair level, set up/S for shower transfers, toileting, and bathing  self care retraining, transfers, balance, endurance, UB strengthening, pt/family education   Mobility   CGA to min A transfers, S w/c mobility, mod A sit to stand  mod I  to Supervision  transfers, short distance gait training, stairs   Communication             Safety/Cognition/ Behavioral Observations            Pain   C/o pain relieved with Q4 percocet PRN  Pain less than 2  Assess pain qshift and PRN   Skin   Right AKA dressed with ABD and Kerlix  Daily dressing changes, promote proper wound healing, remain free of infection  Assess skin qhift and PRN    Rehab Goals Patient on target to meet rehab goals: Yes *See Care Plan and progress notes for long and short-term goals.     Barriers to Discharge  Current Status/Progress Possible Resolutions Date Resolved   Physician    Medical stability        See medical problem list.  Patient should be medically ready for discharge this week      Nursing                  PT  Home environment access/layout                 OT  SLP                SW                Discharge Planning/Teaching Needs:  Home with wife who can provide 24/7 supervision/ light assistance.  Teaching to take place prior to d/c.   Team Discussion:  Right AKA and got stump shrinker today.  Some n/v today.  Mod assist +2 on stairs which is primary concern.  Supervision to min assist ADLs and supervision goals overall with PT/OT.  Team highly recommends stair lift for home.  PT to continue to trial new approaches to steps.    Revisions to Treatment Plan:  NA    Continued Need for Acute Rehabilitation Level of Care: The patient requires daily medical management by a physician with specialized training in physical medicine and rehabilitation for the following conditions: Daily direction of a multidisciplinary physical rehabilitation program to ensure safe treatment while eliciting the highest outcome that is of practical value to the patient.: Yes Daily medical management of patient stability for increased activity during participation in an intensive rehabilitation regime.: Yes Daily analysis of laboratory values  and/or radiology reports with any subsequent need for medication adjustment of medical intervention for : Post surgical problems;Wound care problems   I attest that I was present, lead the team conference, and concur with the assessment and plan of the team.   Cherene Dobbins 02/15/2018, 4:15 PM

## 2018-02-15 NOTE — Progress Notes (Signed)
Occupational Therapy Session Note  Patient Details  Name: [name] MRN: [id number] Date of Birth: August 11, 1940  Today's Date: 02/15/2018 OT Individual Time: 4128-7867 OT Individual Time Calculation (min): 75 min    Short Term Goals: Week 1:  OT Short Term Goal 1 (Week 1): STGs=LTGs due to ELOS  Skilled Therapeutic Interventions/Progress Updates:    Pt received in bed and requested a shower. Today's tx focused on safe functional mobility and adaptive approaches.  Pt initially tried to sit up to EOB pulling on bed rails with HOB lifted with some obvious difficulty.  He stated he sleeps on the R side of his bed at home.  Pt practiced rolling onto his R side and then pushing up to sit with mod A as he was struggling with technique.  He practiced it a 2nd time sit >< sidelying to sit and was able to sit up with S stating "oh that was easy I just needed to get it right". Squat pivot to w/c with close S.  Sit to stand at grab bar in BR with min A to doff pants over hips and then transferred to shower seat with S using bar for support. Once in shower completed bathing with set up only using lateral leans.   He returned to wc and then to bed to don LB clothing.  Pt has difficulty with weight shifts laterally to elevate hips so he uses a large lateral lean.  Explained to him that this is ok from EOB but will be too difficult from a toilet. He stated he has still been needing help with clothing management with toileting. He donned shrinker and shorts and then transferred to wc to don sock and shoe with foot placed on stool.  This enabled him to reach his foot.    Pt self propelled to gym to work on lateral weight shifts.  He transferred to mat and worked on various exercises to increase that strength to get good hip elevation to enable him to bring clothing up/down hips. He worked on pushing ball away with arm on ball with trunk elongation on side of ball, placing flat discs under each hip and dynamic reach  on each side above shoulder height.  Pt returned to room with all needs met.    Therapy Documentation Precautions:  Precautions Precautions: Fall Precaution Comments: Rt AKA Restrictions Weight Bearing Restrictions: Yes RLE Weight Bearing: Non weight bearing   Pain: Pain Assessment Pain Scale: 0-10 Pain Score: 3  Pain Type: Acute pain Pain Location: Leg Pain Orientation: Right Pain Descriptors / Indicators: Aching Pain Onset: On-going Pain Intervention(s): Other (Comment)(pt declined intervention, stated pain was tolerable) ADL: ADL Eating: Not assessed Grooming: Setup Where Assessed-Grooming: Wheelchair, Sitting at sink Upper Body Bathing: Supervision/safety Where Assessed-Upper Body Bathing: Edge of bed Lower Body Bathing: Contact guard Where Assessed-Lower Body Bathing: Bed level, Edge of bed Upper Body Dressing: Setup Where Assessed-Upper Body Dressing: Edge of bed Lower Body Dressing: Minimal assistance(for donning Rt shrinker) Where Assessed-Lower Body Dressing: Edge of bed Toileting: Not assessed Gaffer Transfer: Not assessed  Therapy/Group: Individual Therapy  [place] 02/15/2018, 9:57 AM

## 2018-02-15 NOTE — Plan of Care (Signed)
  Problem: Consults Goal: RH LIMB LOSS PATIENT EDUCATION Description Description: See Patient Education module for eduction specifics. Outcome: Progressing Goal: Skin Care Protocol Initiated - if Braden Score 18 or less Description If consults are not indicated, leave blank or document N/A Outcome: Progressing Goal: Nutrition Consult-if indicated Outcome: Progressing Goal: Diabetes Guidelines if Diabetic/Glucose > 140 Description If diabetic or lab glucose is > 140 mg/dl - Initiate Diabetes/Hyperglycemia Guidelines & Document Interventions  Outcome: Progressing   Problem: RH BOWEL ELIMINATION Goal: RH STG MANAGE BOWEL WITH ASSISTANCE Description STG Manage Bowel with  Min Assistance.  Outcome: Progressing Goal: RH STG MANAGE BOWEL W/MEDICATION W/ASSISTANCE Description STG Manage Bowel with Medication with min  Assistance.  Outcome: Progressing   Problem: RH BLADDER ELIMINATION Goal: RH STG MANAGE BLADDER WITH ASSISTANCE Description STG Manage Bladder With min  Assistance  Outcome: Progressing   Problem: RH SKIN INTEGRITY Goal: RH STG SKIN FREE OF INFECTION/BREAKDOWN Description Skin will be free of skin infection/ breakdown entire stay on rtehab  Outcome: Progressing Goal: RH STG MAINTAIN SKIN INTEGRITY WITH ASSISTANCE Description STG Maintain Skin Integrity With Assistance. Outcome: Progressing   Problem: RH SAFETY Goal: RH STG ADHERE TO SAFETY PRECAUTIONS W/ASSISTANCE/DEVICE Description STG Adhere to Safety Precautions With min  Assistance/Device.  Outcome: Progressing   Problem: RH PAIN MANAGEMENT Goal: RH STG PAIN MANAGED AT OR BELOW PT'S PAIN GOAL Description Pain less than 2  Outcome: Progressing   Problem: RH KNOWLEDGE DEFICIT LIMB LOSS Goal: RH STG INCREASE KNOWLEDGE OF SELF CARE AFTER LIMB LOSS Description Pt will increase knowledge re limb loss; min assist  Outcome: Progressing

## 2018-02-15 NOTE — Progress Notes (Signed)
Occupational Therapy Session Note  Patient Details  Name: [name] MRN: [id number] Date of Birth: Jan 27, 1941  Today's Date: 02/15/2018 OT Individual Time: 1430-1525 OT Individual Time Calculation (min): 55 min    Short Term Goals: Week 1:  OT Short Term Goal 1 (Week 1): STGs=LTGs due to ELOS  Skilled Therapeutic Interventions/Progress Updates:    Pt received sitting up in w/c agreeable to therapy however c/o nausea, anxiety, and phantom pains in his "R toe". Pt was guided through mirror therapy exercises for phantom pain relief-which pt really enjoyed. Gave pt and his wife techniques to complete this at home as well. Pt transitioned to sitting EOM in therapy gym, with CGA for squat pivot transfer. Pt used un-weighted ball to complete core stabilization exercises with demo provided throughout, as well as vc for positioning. Pt became emotional several times during session re d/c and wanting to be home. Emotional support and therapeutic use of self provided to comfort pt. Pt was escorted off the unit, with pt performing 200 ft of w/c propulsion with (S). Instruction provided for navigating elevator. While in hospital atrium, pt became comforted and tearful when listening to volunteer play piano. Pt returned to his room and was left supine with all needs met, wife present.   Therapy Documentation Precautions:  Precautions Precautions: Fall Precaution Comments: Rt AKA Restrictions Weight Bearing Restrictions: Yes RLE Weight Bearing: Non weight bearing   Vital Signs: Therapy Vitals Temp: 98.3 F (36.8 C) Temp Source: Oral Pulse Rate: 82 Resp: 16 BP: (!) 130/56 Patient Position (if appropriate): Lying Oxygen Therapy SpO2: 99 % O2 Device: Room Air Pain: Pain Assessment Pain Scale: 0-10 Pain Score: 5  Pain Type: Phantom pain Pain Location: Leg Pain Orientation: Left Pain Descriptors / Indicators: Sharp Pain Onset: On-going Pain Intervention(s): Guided imagery;Other  (Comment)(mirror therapy)   Therapy/Group: Individual Therapy  Curtis Sites 02/15/2018, 4:22 PM

## 2018-02-15 NOTE — Progress Notes (Signed)
Social Work Patient ID: [name], male   DOB: 11/01/1940, 77 y.o.   MRN: [id number]   Met with pt and wife following team conference.  Both aware and agreeable with targeted d/c date of 1/11, however, aware that this is the earliest the team feels he may be ready.  Discussed focus on stairs of primary concern.  Also, discussed option of installing stair lift for longer term planning and gave wife information on a couple of local companies she could contact for estimate.  She is very agreeable and plans to reach out this week.  Pt very hopeful (and a little tearful) that he will be ready by Sat.  Continue to follow.  HOYLE, LUCY, LCSW  

## 2018-02-15 NOTE — Progress Notes (Signed)
   VASCULAR SURGERY ASSESSMENT & PLAN:   POD 8 s/p right AKA. Healing well.   He has an office visit scheduled for staple removal.  SUBJECTIVE:   Tired today  PHYSICAL EXAM:   Vitals:   02/14/18 1517 02/14/18 1925 02/15/18 0352 02/15/18 1409  BP: 129/60 138/72 140/64 (!) 130/56  Pulse: 82 82 80 82  Resp: 19 18 17 16   Temp: 98.3 F (36.8 C) 98.1 F (36.7 C) 98.8 F (37.1 C) 98.3 F (36.8 C)  TempSrc: Oral Oral Oral Oral  SpO2: 100% 100% 99% 99%  Weight:      Height:       Dressing on right AKA is dry.  LABS:   CBG (last 3)  Recent Labs    02/14/18 2135 02/15/18 0634 02/15/18 1156  GLUCAP 108* 103* 105*    PROBLEM LIST:    Active Problems:   Unilateral AKA ([facility])   CURRENT MEDS:   . apixaban  5 mg Oral BID  . brimonidine  1 drop Both Eyes BID  . cholecalciferol  1,000 Units Oral Daily  . clopidogrel  75 mg Oral Daily  . furosemide  20 mg Oral Daily  . imatinib  300 mg Oral Q lunch  . insulin aspart  0-9 Units Subcutaneous TID WC  . isosorbide mononitrate  30 mg Oral Daily  . multivitamin with minerals  1 tablet Oral Daily  . pantoprazole  40 mg Oral Daily  . polyethylene glycol  17 g Oral BID  . predniSONE  3 mg Oral Q breakfast  . pyridostigmine  60 mg Oral Daily  . rosuvastatin  5 mg Oral Daily  . timolol  1 drop Both Eyes BID  . traZODone  25-50 mg Oral QHS    [name] Beeper: [phone] Office: [phone] 02/15/2018

## 2018-02-16 ENCOUNTER — Inpatient Hospital Stay (HOSPITAL_COMMUNITY): Payer: No Typology Code available for payment source | Admitting: Occupational Therapy

## 2018-02-16 ENCOUNTER — Inpatient Hospital Stay (HOSPITAL_COMMUNITY): Payer: No Typology Code available for payment source

## 2018-02-16 ENCOUNTER — Inpatient Hospital Stay (HOSPITAL_COMMUNITY): Payer: No Typology Code available for payment source | Admitting: *Deleted

## 2018-02-16 ENCOUNTER — Inpatient Hospital Stay (HOSPITAL_COMMUNITY): Payer: No Typology Code available for payment source | Admitting: Physical Therapy

## 2018-02-16 ENCOUNTER — Ambulatory Visit: Payer: Medicare Other | Admitting: Physician Assistant

## 2018-02-16 LAB — GLUCOSE, CAPILLARY
GLUCOSE-CAPILLARY: 95 mg/dL (ref 70–99)
Glucose-Capillary: 113 mg/dL — ABNORMAL HIGH (ref 70–99)
Glucose-Capillary: 124 mg/dL — ABNORMAL HIGH (ref 70–99)
Glucose-Capillary: 99 mg/dL (ref 70–99)

## 2018-02-16 MED ORDER — ACETAMINOPHEN 325 MG PO TABS: 325.0000 mg | ORAL_TABLET | ORAL | Status: DC | PRN

## 2018-02-16 MED ORDER — POLYETHYLENE GLYCOL 3350 17 G PO PACK: 17.0000 g | PACK | Freq: Two times a day (BID) | ORAL | 0 refills | Status: DC

## 2018-02-16 NOTE — Progress Notes (Signed)
Occupational Therapy Session Note  Patient Details  Name: [name] MRN: [id number] Date of Birth: 1940-05-18  Today's Date: 02/16/2018 OT Individual Time: 1500-1557 OT Individual Time Calculation (min): 57 min    Short Term Goals: Week 1:  OT Short Term Goal 1 (Week 1): STGs=LTGs due to ELOS  Skilled Therapeutic Interventions/Progress Updates:    Upon entering the room, pt supine in bed with wife present in the room. Pt requesting to shower this session. Pt performed supine >sit with supervision and transferred into wheelchair with min guard lateral scoot. Pt doffing clothing while seated in wheelchair. Min guard squat pivot transfer onto TTB. Pt bathing at supervision level from seated position with lateral leans to wash buttocks and peri area. Pt returning to wheelchair in same manner. Pt performed grooming tasks at sink from wheelchair level at mod I. Lateral scoot back to bed with steady assist and rolling L <> R to don LB clothing. Pt remained in bed secondary to fatigue. Pt and caregiver with several questions regarding upcoming discharge on Saturday. OT answered questions until they were satisfied and no further concerns at this time.Bed alarm activated and call bell within reach upon exiting the room.   Therapy Documentation Precautions:  Precautions Precautions: Fall Precaution Comments: Rt AKA Restrictions Weight Bearing Restrictions: Yes RLE Weight Bearing: Non weight bearing    ADL: ADL Eating: Not assessed Grooming: Setup Where Assessed-Grooming: Wheelchair, Sitting at sink Upper Body Bathing: Supervision/safety Where Assessed-Upper Body Bathing: Edge of bed Lower Body Bathing: Contact guard Where Assessed-Lower Body Bathing: Bed level, Edge of bed Upper Body Dressing: Setup Where Assessed-Upper Body Dressing: Edge of bed Lower Body Dressing: Minimal assistance(for donning Rt shrinker) Where Assessed-Lower Body Dressing: Edge of bed Toileting: Not  assessed Gaffer Transfer: Not assessed   Therapy/Group: Individual Therapy  Gypsy Decant 02/16/2018, 4:21 PM

## 2018-02-16 NOTE — Evaluation (Signed)
Recreational Therapy Assessment and Plan  Patient Details  Name: [name] MRN: [id number] Date of Birth: [birth year] Today's Date: 02/16/2018  Rehab Potential: Good ELOS: discharge 1/11   Assessment  Problem List:      Patient Active Problem List   Diagnosis Date Noted  . 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 Acute blood loss anemia 02/08/2018  . Post-operative pain   . Unilateral AKA, right ([place])   . Leukocytosis   . Ischemic foot 02/05/2018  . Ischemia of right lower extremity   . Pressure injury of skin 12/27/2017  . Arterial occlusive disease   . Postoperative pain   . CKD (chronic kidney disease), stage III ([place])   . History of CVA (cerebrovascular accident)   . 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 Septic arthritis of left sternoclavicular joint ([place]) 11/03/2017  . Cervical spinal cord compression ([place]) 10/30/2017  . Nasal abscess 09/25/2017  . Immunocompromised ([place]) 09/25/2017  . Cellulitis of right axilla 09/25/2017  . Anemia 08/05/2017  . Transient ischemic attack (TIA) 07/23/2017  . Subcortical infarction ([place])   . Right hemiparesis ([place])   . Ataxia 07/21/2017  . TIA (transient ischemic attack)   . Thrombocytopenia ([place])   . Wheezing 06/17/2017  . Bursitis of left elbow 05/01/2017  . Dystonia 12/29/2016  . PAD (peripheral artery disease) ([place]) 11/18/2016  . Osteoporosis 08/28/2016  . Paronychia of great toe, left 08/10/2016  . Carotid arterial disease ([place]) 07/09/2016  . Hypocalcemia 07/09/2016  . Diplopia 07/02/2016  . S/P lumbar laminectomy 03/11/2016  . Radicular pain of left lower extremity 02/21/2016  . Cough 01/10/2016  . 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 Myasthenia gravis ([place])   . Diabetes mellitus type 2 in nonobese (HCC)   . Coronary artery disease involving native coronary artery of native heart without angina pectoris   . Ataxia, post-stroke   . Gait disturbance, post-stroke    . Proximal leg weakness   . Cervical spondylosis without myelopathy   . Achilles tendinitis of right lower extremity   . Chronic diastolic CHF (congestive heart failure) ([place])   . Gastroesophageal reflux disease without esophagitis   . Cerebral infarction involving left cerebellar artery ([place]) 06/20/2015  . Cerebellar stroke ([place])   . Ischemic stroke ([place])   . Cerebrovascular accident (CVA) due to thrombosis of left vertebral artery ([place])   . History of pulmonary embolus (PE)   . Chronic anticoagulation   . HLD (hyperlipidemia)   . Neck pain 03/19/2015  . Pain in the chest   . 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 Chronic diastolic heart failure, NYHA class 1 ([place]) 03/07/2015  . Diabetic gastroparesis ([place]) 03/07/2015  . Screening for osteoporosis 07/19/2014  . Dyspnea 04/09/2014  . Edema 04/09/2014  . Routine general medical examination at a health care facility 10/23/2013  . Bilateral carotid bruits 05/24/2013  . Abnormal CT scan, lung 05/24/2013  . Encounter for therapeutic drug monitoring 03/14/2013  . Lymphadenopathy, inguinal 01/11/2013  . AKI (acute kidney injury) ([place]) 10/28/2012  . 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 Myasthenia gravis with exacerbation ([place]) 03/31/2012  . Disease of salivary gland 12/09/2011  . Left facial pain 10/28/2011  . Pulmonary embolism ([place]) 10/25/2011  . 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 GERD with stricture 09/23/2011  . Long term (current) use of anticoagulants 08/03/2011  . History of pulmonary embolism 07/30/2011  . History of DVT (deep vein thrombosis) 07/30/2011  .  Subconjunctival hemorrhage 06/22/2011  . Bilateral conjunctivitis 04/09/2011  . Diabetes mellitus type 2, controlled ([place]) 12/18/2010  . Hearing loss 08/04/2010  . Ptosis of eyelid 12/30/2009  . Essential hypertension 04/24/2009  . HYPERLIPIDEMIA, MIXED 10/05/2008  . HEMORRHOIDS-INTERNAL 07/02/2008  .  HEMORRHOIDS-EXTERNAL 07/02/2008  . DIVERTICULOSIS-COLON 07/02/2008  . PERSONAL HX COLONIC POLYPS 07/02/2008  . RASH-NONVESICULAR 02/07/2008  . PNEUMONIA 12/08/2007  . 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 CAD (coronary artery disease) 07/07/2007  . NEPHROLITHIASIS, HX OF 07/07/2007    Past Medical History:      Past Medical History:  Diagnosis Date  . Bruises easily    d/t being on Eliquis  . Carotid artery disease ([place])    a. Duplex 05/2014: 46-27% RICA, 0-35% LICA, elevated velocities in right subclavian artery, normal left subclavian artery..  . Chronic back pain    HNP  . Chronic kidney disease (CKD)   . Claustrophobia    takes Ativan as needed  . 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 Coronary atherosclerosis of unspecified type of vessel, native or graft    a. cath in 2003 showed 10-20% LM, scattered 20% prox-mid LAD, 30% more distal LAD, 50-60% stenosis of LAD towards apex, 20% Cx, 30% dRCA, focal 95% stenosis in smaller of 2 branches of PDA, EF 60-65%.  . Diabetes mellitus ([place])   . Diarrhea    takes Imodium daily as needed  . Diverticulosis   . Diverticulosis of colon (without mention of hemorrhage)   . DJD (degenerative joint disease)   . DVT (deep venous thrombosis) ([place]) 07/2011  . Esophageal stricture   . Essential hypertension    takes Imdur and Lisinopril daily  . Gastric polyps    benign  . GERD (gastroesophageal reflux disease)    takes Nexium daily  . Glaucoma    uses eye drops  . History of bronchitis    not sure when the last time  . History of colon polyps    benign  . History of kidney stones   . History of kidney stones   . Insomnia    takes Melatonin nightly as needed  . Mixed hyperlipidemia    takes Crestor daily  . Myasthenia gravis ([place])    uses prednisone  . Osteoporosis 2016  . Peripheral edema    takes Lasix daily  . Pituitary tumor     checks Dr[facility]Walden at [facility] checks it every Jan   . Pneumonia 01/2016  . PONV (postoperative nausea and vomiting)   . Ptosis   . Pulmonary embolus ([place]) 2012  . Stroke [facility]) 06/2015   Past Surgical History:  Past Surgical History:  Procedure Laterality Date  . ABDOMINAL AORTOGRAM W/LOWER EXTREMITY N/A 11/18/2016   Procedure: ABDOMINAL AORTOGRAM W/LOWER EXTREMITY;  Surgeon: [name], MD;  Location: [facility];  Service: Cardiovascular;  Laterality: N/A;  . AMPUTATION Right 02/07/2018   Procedure: right leg AMPUTATION ABOVE KNEE;  Surgeon: [name], MD;  Location: [facility];  Service: Vascular;  Laterality: Right;  . AORTOGRAM Right 12/23/2017   Procedure: AORTOGRAM, RIGHT LOWER EXTREMITY ANGIOGRAM, RIGHT SUPERFICIAL FEMORAL ARTERY ANGIOPLASTY WITH STENT, RIGHT POPLITEAL ARTERY ANGIOPLASTY WITH STENT, LEFT GROIN CUTDOWN AND LEFT FEMORAL ARTERY REPAIR;  Surgeon: [name], MD;  Location: [facility];  Service: Vascular;  Laterality: Right;  . BACK SURGERY     x2, lumbar and cervical  . CARDIAC CATHETERIZATION    . CATARACT EXTRACTION Bilateral  12/12  . COLONOSCOPY    . ESOPHAGOGASTRODUODENOSCOPY ENDOSCOPY  04/2015  . IR GENERIC HISTORICAL  03/03/2016   IR IVC FILTER PLMT / S&I [name] GUID/MOD SED 03/03/2016 [name], MD [facility]  . IR GENERIC HISTORICAL  04/09/2016   IR RADIOLOGIST EVAL & MGMT 04/09/2016 [name], MD [facility]  . IR GENERIC HISTORICAL  04/30/2016   IR IVC FILTER RETRIEVAL / S&I [name] GUID/MOD SED 04/30/2016 [name], MD [facility]INTER[facility] RAD  . LITHOTRIPSY    . LUMBAR LAMINECTOMY/DECOMPRESSION MICRODISCECTOMY Right 09/14/2012   Procedure: Right Lumbar three-four, four-five, Lumbar five-Sacral one decompressive laminectomy;  Surgeon: [name], MD;  Location: [facility];  Service: Neurosurgery;  Laterality: Right;  . LUMBAR LAMINECTOMY/DECOMPRESSION MICRODISCECTOMY Left 03/11/2016   Procedure: Left Lumbar  Two-ThreeMicrodiscectomy;  Surgeon: [name], MD;  Location: [facility];  Service: Neurosurgery;  Laterality: Left;  . PERIPHERAL VASCULAR INTERVENTION  11/18/2016   Procedure: PERIPHERAL VASCULAR INTERVENTION;  Surgeon: [name], MD;  Location: [facility];  Service: Cardiovascular;;  Left popliteal  . SHOULDER SURGERY Left 05/07/2009  . TONSILLECTOMY      Assessment & Plan Clinical Impression: Jameal Razzano is a 78 year old male withhistory of CKD, CML- on Gleevec, MG- on Mestinon and chronic steroids, subcortical CVA with CIR stay 2019, PVD with ischemic changes RLE who was admitted on 02/05/18 for worsening of symptoms. He underwent R-AKA by Dr. Scot Dock on 02/07/18.  Pt presents with decreased activity tolerance, decreased functional mobility, decreased balance, acute pain Limiting pt's independence with leisure/community pursuits.  Met with pt today and discussed community reintegration.  Pt agreeable to participate in outing to be scheduled tomorrow.   Leisure History/Participation Premorbid leisure interest/current participation: Community - Doctor, hospital - Travel (Comment);Sports - [employer] - Production designer, theatre/television/film Other Leisure Interests: Television Leisure Participation Style: With Family/Friends Awareness of Community Resources: Good-identify 3 post discharge leisure resources Psychosocial / Spiritual Spiritual Interests: [place]: Cooperative Academic librarian Appropriate for Education?: Yes Patient Agreeable to [employer]?: Yes Recreational Therapy Orientation Orientation -Reviewed with patient: Available activity resources Strengths/Weaknesses Patient Strengths/Abilities: Willingness to participate;Active premorbidly Patient weaknesses: Physical limitations TR Patient demonstrates impairments in the following area(s): Edema;Endurance;Motor;Safety;Pain;Skin Integrity  Plan Rec Therapy Plan Is patient  appropriate for Therapeutic Recreation?: Yes Rehab Potential: Good Treatment times per week: Min1 TR session >45 minutes for community reintegration during LOS Estimated Length of Stay: discharge 1/11 TR Treatment/Interventions: Adaptive equipment instruction;Community reintegration;Patient/family education;Therapeutic exercise;Wheelchair propulsion/positioning;UE/LE Coordination activities;Functional mobility training Recommendations for other services: Neuropsych  Recommendations for other services: Neuropsych  Discharge Criteria: Patient will be discharged from TR if patient refuses treatment 3 consecutive times without medical reason.  If treatment goals not met, if there is a change in medical status, if patient makes no progress towards goals or if patient is discharged from hospital.  The above assessment, treatment plan, treatment alternatives and goals were discussed and mutually agreed upon: by patient  [place] 02/16/2018, 4:42 PM

## 2018-02-16 NOTE — Progress Notes (Signed)
[facility] PHYSICAL MEDICINE & REHABILITATION PROGRESS NOTE   Subjective/Complaints: Pt feeling well. Remains quite motivated. Wants to work on stairs more this morning. Pain under control  ROS: Patient denies fever, rash, sore throat, blurred vision, nausea, vomiting, diarrhea, cough, shortness of breath or chest pain, joint or back pain, headache, or mood change.    Objective:   No results found. No results for input(s): WBC, HGB, HCT, PLT in the last 72 hours. No results for input(s): NA, K, CL, CO2, GLUCOSE, BUN, CREATININE, CALCIUM in the last 72 hours.  Intake/Output Summary (Last 24 hours) at 02/16/2018 0852 Last data filed at 02/16/2018 0430 Gross per 24 hour  Intake 240 ml  Output 250 ml  Net -10 ml     Physical Exam: Vital Signs Blood pressure 134/66, pulse 89, temperature 98.4 F (36.9 C), temperature source Oral, resp. rate 18, height 5\' 7"  (1.702 m), weight 71.1 kg, SpO2 99 %. Constitutional: No distress . Vital signs reviewed. HEENT: EOMI, oral membranes moist Neck: supple Cardiovascular: RRR without murmur. No JVD    Respiratory: CTA Bilaterally without wheezes or rales. Normal effort    GI: BS +, non-tender, non-distended  Musculoskeletal:  General: Edemapresent. . Comments: Min edema R-AKA. Left leg non-tender Neurological: He is alertand oriented to person, place, and time. Nocranial nerve deficit. UE 5/5. RLE: 4-/5 HF,  LLE 4+/5 prox to distal. Skin: wound remains Clean and generally dry. intact Psychiatric: pleasant.    Assessment/Plan: 1. Functional deficits secondary to right aka which require 3+ hours per day of interdisciplinary therapy in a comprehensive inpatient rehab setting.  Physiatrist is providing close team supervision and 24 hour management of active medical problems listed below.  Physiatrist and rehab team continue to assess barriers to discharge/monitor patient progress toward functional and medical goals  Care  Tool:  Bathing    Body parts bathed by patient: Right arm, Left arm, Chest, Abdomen, Front perineal area, Buttocks, Right upper leg, Left upper leg, Face, Left lower leg   Body parts bathed by helper: Left lower leg Body parts n/a: Right lower leg   Bathing assist Assist Level: Set up assist     Upper Body Dressing/Undressing Upper body dressing   What is the patient wearing?: Pull over shirt    Upper body assist Assist Level: Set up assist    Lower Body Dressing/Undressing Lower body dressing      What is the patient wearing?: Pants, Ace wrap/stump shrinker     Lower body assist Assist for lower body dressing: Supervision/Verbal cueing     Toileting Toileting    Toileting assist Assist for toileting: Minimal Assistance - Patient > 75% Assistive Device Comment: (urinal patient spills requiring pad change)   Transfers Chair/bed transfer  Transfers assist     Chair/bed transfer assist level: Supervision/Verbal cueing     Locomotion Ambulation   Ambulation assist      Assist level: Minimal Assistance - Patient > 75% Assistive device: Walker-rolling Max distance: 5'   Walk 10 feet activity   Assist  Walk 10 feet activity did not occur: Safety/medical concerns        Walk 50 feet activity   Assist Walk 50 feet with 2 turns activity did not occur: Safety/medical concerns         Walk 150 feet activity   Assist Walk 150 feet activity did not occur: Safety/medical concerns         Walk 10 feet on uneven surface  activity   Assist  Walk 10 feet on uneven surfaces activity did not occur: Safety/medical concerns         Wheelchair     Assist Will patient use wheelchair at discharge?: Yes Type of Wheelchair: Manual    Wheelchair assist level: Supervision/Verbal cueing Max wheelchair distance: 200'    Wheelchair 50 feet with 2 turns activity    Assist        Assist Level: Supervision/Verbal cueing   Wheelchair 150  feet activity     Assist Wheelchair 150 feet activity did not occur: Safety/medical concerns   Assist Level: Supervision/Verbal cueing    Medical Problem List and Plan: 1.Functional deficitssecondary to PAD, right AKA. -Continue CIR therapies including PT, OT  -prosthetic education  -biotech brought stump shrinkers 2.PE/DVT Anticoagulation:Pharmaceutical:Other (comment)Eliquis resumed today. Monitor hemoglobin check CBC on 02/14/2018 3. Pain Management:Oxycodone prn effective. 4. Mood:LCSW to follow for evaluation and support 5. Neuropsych: This patientiscapable of making decisions on hisown behalf. 6. Skin/Wound Care:  -Continue protein supplement.  -shrinker 7. Fluids/Electrolytes/Nutrition:encourage PO  -supplement potassium 8.Acute on chronic CKD:Monitor with serial check. SCr improving.1.82--->1.62 1/3   -recheck tomorrow 9. SL:HTDSKAJ on mestinon andchronic steriods. 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 Frequency: still voiding frequently  -likely lasix related- dose was reduced to 20mg  prior to admit  - ua/ucx negative  -scans low 12. Acute on chronic anemia: Continue to monitor. 13. Leukocytosis: Resolved 14. HTN: Monitor BP bid--continue Lasix and Imdur.  Vitals:   02/15/18 1953 02/16/18 0348  BP: 136/66 134/66  Pulse: 89 89  Resp: 17 18  Temp: 99.6 F (37.6 C) 98.4 F (36.9 C)  SpO2: 100% 99%  Blood pressure controlled 1/8 15. Insomnia: Trazodone added to help with sleep wake cycle.  16. PVD s/p stenting: On Plavix and Crestor.    LOS: 6 days A FACE TO FACE EVALUATION WAS PERFORMED  Meredith Staggers 02/16/2018, 8:52 AM

## 2018-02-16 NOTE — Progress Notes (Addendum)
Physical Therapy Session Note  Patient Details  Name: [name] MRN: [id number] Date of Birth: Feb 16, 1940  Today's Date: 02/16/2018 PT Individual Time: 1345-1430 PT Individual Time Calculation (min): 45 min   Short Term Goals: Week 1:  PT Short Term Goal 1 (Week 1): =LTG due to ELOS  Skilled Therapeutic Interventions/Progress Updates:    Pt received seated in bed, agreeable to PT. Pt reports increase in R residual limb pain this date and that he just received pain medication from RN. Lateral scoot transfer bed to w/c with CGA. Manual w/c propulsion x 150 ft with BUE and Supervision. Sit to stand with mod A to RW from w/c height. Demonstration of how to ascend/descend stairs with use of shower chair on stairs. Pt requires max A x 2 to stand from shower chair up to L handrail of stairs. Pt is able to advance LLE up step while seated, stand to rail with max A x 2, sit with mod A, then advance LLE up next step. Pt is min to mod A x 2 to descend via same method due to increase in height of seat when descending. Pt fatigues very quickly performing stairs via this method and will most likely not be safe to perform this with family at this time. Education with patient and his wife Hassan Rowan about alternative methods of getting up stairs and into the house. Medical transport is an expensive option but also provided pt and his family with handout of how to bump a w/c up/down stairs. Pt's wife reports the friends and family members who would be able to assist the pt into his home are not available to come during therapy sessions for training but they are able to review handout. Pt and his wife plan on purchasing a chair lift for interior 6 stairs and are able to have church members build a ramp for exterior 3 stairs in the next week. Handout provided on how to safely build a ramp. Assisted pt with donning his shrinker for R residual limb at end of session. Education with patient about wearing schedule and how to  wash shrinkers. Lateral scoot transfer w/c to bed with CGA. Pt left seated in bed with needs in reach, wife present, bed alarm in place. Co-treat session with recreational therapist.   Therapy Documentation Precautions:  Precautions Precautions: Fall Precaution Comments: Rt AKA Restrictions Weight Bearing Restrictions: Yes RLE Weight Bearing: Non weight bearing   Therapy/Group: Individual Therapy  Excell Seltzer, PT, DPT  02/16/2018, 3:51 PM

## 2018-02-16 NOTE — Progress Notes (Signed)
Physical Therapy Session Note  Patient Details  Name: [name] MRN: [id number] Date of Birth: November 10, 1940  Today's Date: 02/16/2018 PT Individual Time: [phone] and 1133-1200 PT Individual Time Calculation (min): 58 min and 27 minutes   Short Term Goals: Week 1:  PT Short Term Goal 1 (Week 1): =LTG due to ELOS  Skilled Therapeutic Interventions/Progress Updates:    Session 1: Pt supine in bed upon PT arrival, agreeable to therapy tx and reports pain 4/10 in R residual limb. Pt transferred to sitting EOB with supervision and performed squat pivot to w/c with CGA. Pt propelled w/c to the gym with supervision x 200 ft using B UEs. Pt performed lateral scoot to mat with CGA and transferred to supine with supervision. Pt performed LE therex this session for strengthening 2 x 10 each: straight leg raise, sidelying and supine hip abduction, sidelying hip extension and bridges. Pt transferred to sitting with supervision and performed 2 x 5 sit<>stands from mat with RW and min assist for LE strengthening. Pt worked on seated balance and UE strengthening to hit ball back and forth with 3# dowel. Pt performed UE strengthening exercises with 3# dowel 2 x 10 each: bicep curls, shoulder flexion and shoulder press. Pt performed standing balance with RW x 2 trials while tossing horseshoes, CGA. Pt transferred to w/c with supervision and propelled back to room. Pt transferred to bed CGA and left supine with needs in reach and bed alarm set.   Session 2: Pt supine in bed upon PT arrival, agreeable to therapy tx and reports pain 4/10 in R residual limb. Pt transferred to sitting EOB with supervision and performed squat pivot to w/c with supervision. Pt propelled w/c room<>gym 2 x 200 ft this session with supervision using B UEs. Pt transferred w/c<>mat with supervision this session. Pt performed L LE strengthening exercises this session including 2 x 10 each: mini squats standing with RW, calf raises, seated LAQ  and hip flexion with 4# ankle weight. Pt performed 2 x 5 sit<>stands for strengthening. Pt left in w/c in room at end of session with needs in reach    Therapy Documentation Precautions:  Precautions Precautions: Fall Precaution Comments: Rt AKA Restrictions Weight Bearing Restrictions: Yes RLE Weight Bearing: Non weight bearing  Therapy/Group: Individual Therapy  Netta Corrigan, PT, DPT 02/16/2018, 7:58 AM

## 2018-02-17 ENCOUNTER — Inpatient Hospital Stay (HOSPITAL_COMMUNITY): Payer: No Typology Code available for payment source | Admitting: Occupational Therapy

## 2018-02-17 ENCOUNTER — Encounter (HOSPITAL_COMMUNITY): Payer: No Typology Code available for payment source | Admitting: *Deleted

## 2018-02-17 ENCOUNTER — Inpatient Hospital Stay (HOSPITAL_COMMUNITY): Payer: No Typology Code available for payment source | Admitting: Physical Therapy

## 2018-02-17 LAB — GLUCOSE, CAPILLARY
GLUCOSE-CAPILLARY: 130 mg/dL — AB (ref 70–99)
GLUCOSE-CAPILLARY: 119 mg/dL — AB (ref 70–99)
Glucose-Capillary: 95 mg/dL (ref 70–99)
Glucose-Capillary: 114 mg/dL — ABNORMAL HIGH (ref 70–99)

## 2018-02-17 LAB — BASIC METABOLIC PANEL
Anion gap: 8 (ref 5–15)
BUN: 11 mg/dL (ref 8–23)
CO2: 27 mmol/L (ref 22–32)
Calcium: 8.2 mg/dL — ABNORMAL LOW (ref 8.9–10.3)
Chloride: 102 mmol/L (ref 98–111)
Creatinine, Ser: 1.67 mg/dL — ABNORMAL HIGH (ref 0.61–1.24)
GFR calc non Af Amer: 38 mL/min — ABNORMAL LOW (ref >60)
GFR, EST AFRICAN AMERICAN: 44 mL/min — AB (ref >60)
Glucose, Bld: 96 mg/dL (ref 70–99)
Potassium: 3.3 mmol/L — ABNORMAL LOW (ref 3.5–5.1)
Sodium: 137 mmol/L (ref 135–145)

## 2018-02-17 MED ORDER — POTASSIUM CHLORIDE CRYS ER 20 MEQ PO TBCR
20.0000 meq | EXTENDED_RELEASE_TABLET | Freq: Every day | ORAL | Status: DC
  Administered 2018-02-17 – 2018-02-19 (×3): 20 meq via ORAL
  Filled 2018-02-17 (×3): qty 1

## 2018-02-17 NOTE — Progress Notes (Signed)
Physical Therapy Session Note  Patient Details  Name: [name] MRN: [id number] Date of Birth: [birth year]  Today's Date: 02/17/2018 PT Individual Time:  - 1030-1200 60 minutes     Short Term Goals: Week 1:  PT Short Term Goal 1 (Week 1): =LTG due to ELOS  Skilled Therapeutic Interventions/Progress Updates:    pt participated in community outing to sporting goods store with pt able to perform with supervision at w/c level.  Discussed energy conservation, safety, resolutions to barriers in community all with pt verbalizing understanding.  See shadow chart goal sheets for more info.  Therapy Documentation Precautions:  Precautions Precautions: Fall Precaution Comments: Rt AKA Restrictions Weight Bearing Restrictions: Yes RLE Weight Bearing: Non weight bearing Pain:  no c/o pain   Therapy/Group: concurrent therapy  DONAWERTH,KAREN 02/17/2018, 12:47 PM

## 2018-02-17 NOTE — Progress Notes (Signed)
Occupational Therapy Session Note  Patient Details  Name: [name] MRN: [id number] Date of Birth: November 21, 1940  Today's Date: 02/17/2018 OT Individual Time: 1500-1530 OT Individual Time Calculation (min): 30 min  and Today's Date: 02/17/2018 OT Missed Time: 30 Minutes Missed Time Reason: Patient fatigue   Short Term Goals: Week 1:  OT Short Term Goal 1 (Week 1): STGs=LTGs due to ELOS  Skilled Therapeutic Interventions/Progress Updates:    Pt seen for OT ADL bathing/dressing session. Pt in supine upon arrival with wife present. Pt denying pain and agreeable to tx session. Requesting shower this afternoon.  He completed scoot/squat transfers throughout session with close supervision-CGA for transition from EOB>w/c and w/c <> tub transfer bench. He bathed seated on tub transfer bench with supervision, lateral leans for buttock hygiene. He returned to w/c to dress. He was able to thread LEs into pants with supervision. He stood with RW and steadying assist to pull pants up with assist to advance pants completely over R hip.  Pt requesting to return to supine and rest after dressing task 2/2 fatigue following busy day of therapy and community outing.  Completed supervision squat pivot transfer back to bed.  Pt left in supine with all needs in reach with wife and friends present. Throughout session, education provided regarding energy conservation, modified ADLs, and d/c planning.   Therapy Documentation Precautions:  Precautions Precautions: Fall Precaution Comments: Rt AKA Restrictions Weight Bearing Restrictions: Yes RLE Weight Bearing: Non weight bearing Pain:   No/denies pain   Therapy/Group: Individual Therapy  Annick Dimaio L 02/17/2018, 7:06 AM

## 2018-02-17 NOTE — Progress Notes (Signed)
Recreational Therapy Session Note  Patient Details  Name: [name] MRN: [id number] Date of Birth: 1940/12/09 Today's Date: 02/17/2018 Time:  1030-1215 Pain: no c/o Skilled Therapeutic Interventions/Progress Updates: Pt participated in community reintegration/outing to [employer] at [employer] supervision w/c level.  Goals focused on safe community mobility, identification & negotiation of obstacles, accessing public restroom, energy conservation techniques/education.  See outing goal sheet in shadow chart for full details.  Goals met.   Therapy/Group: [employer]  Tramond Slinker 02/17/2018, 12:51 PM

## 2018-02-17 NOTE — Progress Notes (Signed)
Physical Therapy Session Note  Patient Details  Name: [name] MRN: [id number] Date of Birth: 1940/05/06  Today's Date: 02/17/2018 PT Individual Time: 1400-1500 PT Individual Time Calculation (min): 60 min   Short Term Goals: Week 1:  PT Short Term Goal 1 (Week 1): =LTG due to ELOS  Skilled Therapeutic Interventions/Progress Updates:    Pt received seated in bed, agreeable to PT session. No complaints of pain. Lateral scoot transfer bed to w/c with Supervision. Manual w/c propulsion x 150 ft with BUE support and Supervision. Ascend/descend 2 stairs via shower chair method with mod A x 2 due to increased height of shower chair. Pt still requires significant assist to perform stairs and requires significant energy expenditure. Education with patient and his wife that safest method for assisting pt up the stairs will be bumping w/c up/down stairs per handout that has been provided. Supine BLE strengthening therex x 15 reps. Pt declines to perform prone hip flexor stretch due to claustrophobia. Education with patient about residual limb positioning to prevent contracture as well as phantom limb pain and desensitization to residual limb. Pt requests to return to room to rest before OT session. Lateral scoot transfer w/c to bed with Supervision. Pt left seated in bed with needs in reach, bed alarm in place, wife present.  Therapy Documentation Precautions:  Precautions Precautions: Fall Precaution Comments: Rt AKA Restrictions Weight Bearing Restrictions: Yes RLE Weight Bearing: Non weight bearing    Therapy/Group: Individual Therapy  Excell Seltzer, PT, DPT  02/17/2018, 4:37 PM

## 2018-02-17 NOTE — Progress Notes (Signed)
Recreational Therapy Discharge Summary Patient Details  Name: [name] MRN: [id number] Date of Birth: 04/05/1940 Today's Date: 02/17/2018  Long term goals set: 1  Long term goals met: 1  Comments on progress toward goals:Pt participated in TR services with focus on leisure education, activity analysis with potential modifications, discharge planning and community reintegration.  Pt participated in an outing at overall supervision w/c level on level indoor and outdoor surfaces.  Education provided on energy conservation techniques and activity modification during community pursuits. Pt is scheduled for discharge home with wife on 1/11. Reasons for discharge: treatment goals met Patient/family agrees with progress made and goals achieved: Yes  Deniese Oberry 02/17/2018, 1:00 PM

## 2018-02-17 NOTE — Progress Notes (Signed)
[facility] PHYSICAL MEDICINE & REHABILITATION PROGRESS NOTE   Subjective/Complaints: Up in bathroom. Wearing shrinker. Pain controlled. Happy with progress  ROS: Patient denies fever, rash, sore throat, blurred vision, nausea, vomiting, diarrhea, cough, shortness of breath or chest pain, joint or back pain, headache, or mood change.     Objective:   No results found. No results for input(s): WBC, HGB, HCT, PLT in the last 72 hours. Recent Labs    02/17/18 0542  NA 137  K 3.3*  CL 102  CO2 27  GLUCOSE 96  BUN 11  CREATININE 1.67*  CALCIUM 8.2*    Intake/Output Summary (Last 24 hours) at 02/17/2018 0837 Last data filed at 02/17/2018 0535 Gross per 24 hour  Intake 480 ml  Output 350 ml  Net 130 ml     Physical Exam: Vital Signs Blood pressure (!) 139/55, pulse 85, temperature 99.6 F (37.6 C), temperature source Oral, resp. rate 18, height 5\' 7"  (1.702 m), weight 71.1 kg, SpO2 98 %. Constitutional: No distress . Vital signs reviewed. HEENT: EOMI, oral membranes moist Neck: supple Cardiovascular: RRR without murmur. No JVD    Respiratory: CTA Bilaterally without wheezes or rales. Normal effort    GI: BS +, non-tender, non-distended  Musculoskeletal:  General: Edemapresent. . Comments: Shrinker in place and fitting appropraitely Neurological: He is alertand oriented to person, place, and time. Nocranial nerve deficit. UE 5/5. RLE: 4-/5 HF,  LLE 4+/5 prox to distal. Skin: wound remains Clean and generally dry. intact Psychiatric: pleasant.    Assessment/Plan: 1. Functional deficits secondary to right aka which require 3+ hours per day of interdisciplinary therapy in a comprehensive inpatient rehab setting.  Physiatrist is providing close team supervision and 24 hour management of active medical problems listed below.  Physiatrist and rehab team continue to assess barriers to discharge/monitor patient progress toward functional and medical  goals  Care Tool:  Bathing    Body parts bathed by patient: Right arm, Left arm, Chest, Abdomen, Front perineal area, Buttocks, Right upper leg, Left upper leg, Face, Left lower leg   Body parts bathed by helper: Left lower leg Body parts n/a: Right lower leg   Bathing assist Assist Level: Independent with assistive device     Upper Body Dressing/Undressing Upper body dressing   What is the patient wearing?: Pull over shirt    Upper body assist Assist Level: Supervision/Verbal cueing    Lower Body Dressing/Undressing Lower body dressing      What is the patient wearing?: Pants, Ace wrap/stump shrinker     Lower body assist Assist for lower body dressing: Supervision/Verbal cueing     Toileting Toileting    Toileting assist Assist for toileting: Minimal Assistance - Patient > 75% Assistive Device Comment: (urinal patient spills requiring pad change)   Transfers Chair/bed transfer  Transfers assist     Chair/bed transfer assist level: Contact Guard/Touching assist     Locomotion Ambulation   Ambulation assist      Assist level: Minimal Assistance - Patient > 75% Assistive device: Walker-rolling Max distance: 5'   Walk 10 feet activity   Assist  Walk 10 feet activity did not occur: Safety/medical concerns        Walk 50 feet activity   Assist Walk 50 feet with 2 turns activity did not occur: Safety/medical concerns         Walk 150 feet activity   Assist Walk 150 feet activity did not occur: Safety/medical concerns  Walk 10 feet on uneven surface  activity   Assist Walk 10 feet on uneven surfaces activity did not occur: Safety/medical concerns         Wheelchair     Assist Will patient use wheelchair at discharge?: Yes Type of Wheelchair: Manual    Wheelchair assist level: Supervision/Verbal cueing Max wheelchair distance: 200'    Wheelchair 50 feet with 2 turns activity    Assist        Assist  Level: Supervision/Verbal cueing   Wheelchair 150 feet activity     Assist Wheelchair 150 feet activity did not occur: Safety/medical concerns   Assist Level: Supervision/Verbal cueing    Medical Problem List and Plan: 1.Functional deficitssecondary to PAD, right AKA. -Continue CIR therapies including PT, OT ---ELOS 1/11 -prosthetic education  -[employer] brought stump shrinkers 2.PE/DVT Anticoagulation:Pharmaceutical:Other (comment)Eliquis resumed today. Monitor hemoglobin check CBC on 02/14/2018 3. Pain Management:Oxycodone prn effective. 4. Mood:LCSW to follow for evaluation and support 5. Neuropsych: This patientiscapable of making decisions on hisown behalf. 6. Skin/Wound Care:  -Continue protein supplement.  -shrinker 7. Fluids/Electrolytes/Nutrition:encourage PO  -resume supplement potassium50meq daily   -3.3 1/9--- 8.Acute on chronic CKD:    -Cr 1.67 1.9 9. 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 Frequency: still voiding frequently  -likely lasix related- dose was reduced to 20mg  prior to admit  - ua/ucx negative  -scans low 12. Acute on chronic anemia: Continue to monitor. 13. Leukocytosis: Resolved 14. HTN: Monitor BP bid--continue Lasix and Imdur.  Vitals:   02/17/18 0532 02/17/18 0537  BP: (!) 155/67 (!) 139/55  Pulse: 74 85  Resp: 18 18  Temp: 99.4 F (37.4 C) 99.6 F (37.6 C)  SpO2: 99% 98%  Blood pressure controlled 1/9 15. Insomnia: Trazodone added to help with sleep wake cycle.  16. PVD s/p stenting: On Plavix and Crestor.    LOS: 7 days A FACE TO FACE EVALUATION WAS PERFORMED  Meredith Staggers 02/17/2018, 8:37 AM

## 2018-02-17 NOTE — Progress Notes (Signed)
Physical Therapy Discharge Summary  Patient Details  Name: [name] MRN: [id number] Date of Birth: 09/06/1940  Today's Date: 02/18/2018    Patient has met 4 of 7 long term goals due to improved activity tolerance, improved balance, improved postural control, increased strength and ability to compensate for deficits.  Patient to discharge at a wheelchair level Modified Independent.   Patient's care partner is independent to provide the necessary physical assistance at discharge.  Reasons goals not met: Pt did not meet stair goal due to significant weakness and decreased endurance. Pt is able to perform stairs with assist x 2 and use of shower chair but this method requires significant effort from patient and is not practical long-term. D/c plan is for patient to have family members bring him inside bumping w/c up stairs, handout has been provided to patient's family. Pt's family has plans to have a chair lift installed next week for interior stairs and to have church members build a ramp for exterior stairs. Pt also declined to extend his stay on rehab in order to meet all rehab goals for a safe d/c home despite education that he would benefit from longer stay on rehab.  Recommendation:  Patient will benefit from ongoing skilled PT services in home health setting to continue to advance safe functional mobility, address ongoing impairments in balance, strength, endurance, and minimize fall risk.  Equipment: 16 x 16 manual wheelchair  Reasons for discharge: treatment goals met and discharge from hospital  Patient/family agrees with progress made and goals achieved: Yes  PT Discharge Precautions/Restrictions Restrictions Weight Bearing Restrictions: Yes RLE Weight Bearing: Non weight bearing Vital Signs  Pain Pain Assessment Pain Scale: 0-10 Pain Score: 0-No pain Vision/Perception  Perception Perception: Within Functional Limits Praxis Praxis: Intact   Cognition Arousal/Alertness: Awake/alert Orientation Level: Oriented X4 Sustained Attention: Appears intact Memory: Appears intact Awareness: Appears intact Problem Solving: Appears intact Safety/Judgment: Appears intact Sensation Sensation Light Touch: Appears Intact Proprioception: Appears Intact Coordination Gross Motor Movements are Fluid and Coordinated: Yes Fine Motor Movements are Fluid and Coordinated: Yes Motor  Motor Motor: Other (comment) Motor - Discharge Observations: persisting Generalized weakness   Mobility Bed Mobility Bed Mobility: Rolling Right;Rolling Left;Supine to Sit;Sit to Supine Rolling Right: Independent with assistive device Rolling Left: Independent with assistive device Supine to Sit: Independent with assistive device Sit to Supine: Independent with assistive device Transfers Transfers: Sit to Stand;Stand to Sit;Squat Pivot Transfers;Stand Pivot Transfers Sit to Stand: Minimal Assistance - Patient > 75% Stand to Sit: Minimal Assistance - Patient > 75% Stand Pivot Transfers: Minimal Assistance - Patient > 75% Squat Pivot Transfers: Independent with assistive device Lateral/Scoot Transfers: Independent with assistive device Locomotion  Gait Ambulation: Yes Gait Assistance: Contact Guard/Touching assist Gait Distance (Feet): 20 Feet Assistive device: Rolling walker Gait Gait: Yes Gait Pattern: Impaired Gait Pattern: Step-to pattern;Left flexed knee in stance Stairs / Additional Locomotion Stairs: No Wheelchair Mobility Wheelchair Mobility: Yes Wheelchair Assistance: Independent with assistive device Wheelchair Propulsion: Both upper extremities;Left lower extremity Wheelchair Parts Management: Independent Distance: 200ft  Trunk/Postural Assessment  Cervical Assessment Cervical Assessment: Exceptions to WFL(forward head) Thoracic Assessment Thoracic Assessment: Exceptions to WFL(kyphotic) Lumbar Assessment Lumbar Assessment:  Exceptions to WFL(posterior pelvic tilt) Postural Control Postural Control: Within Functional Limits  Balance Balance Balance Assessed: Yes Static Sitting Balance Static Sitting - Balance Support: No upper extremity supported;Feet supported Static Sitting - Level of Assistance: 6: Modified independent (Device/Increase time) Dynamic Sitting Balance Dynamic Sitting - Balance Support: No upper extremity supported;Feet   supported Dynamic Sitting - Level of Assistance: 6: Modified independent (Device/Increase time) Static Standing Balance Static Standing - Balance Support: Bilateral upper extremity supported;During functional activity Static Standing - Level of Assistance: 5: Stand by assistance Dynamic Standing Balance Dynamic Standing - Balance Support: Bilateral upper extremity supported;During functional activity Dynamic Standing - Level of Assistance: 5: Stand by assistance Extremity Assessment      RLE Assessment RLE Assessment: Exceptions to [facility] Passive Range of Motion (PROM) Comments: R AKA Active Range of Motion (AROM) Comments: lacking 10deg full hip extension.  General Strength Comments: grossly 4-/5 throughout hip  LLE Assessment LLE Assessment: Exceptions to [facility] Active Range of Motion (AROM) Comments: tight HS General Strength Comments: 4/5  to 4+/5 grossly    Lorie Phenix 02/18/2018, 9:38 AM

## 2018-02-18 ENCOUNTER — Inpatient Hospital Stay (HOSPITAL_COMMUNITY): Payer: No Typology Code available for payment source | Admitting: Occupational Therapy

## 2018-02-18 ENCOUNTER — Inpatient Hospital Stay (HOSPITAL_COMMUNITY): Payer: No Typology Code available for payment source | Admitting: Physical Therapy

## 2018-02-18 LAB — GLUCOSE, CAPILLARY
Glucose-Capillary: 101 mg/dL — ABNORMAL HIGH (ref 70–99)
Glucose-Capillary: 122 mg/dL — ABNORMAL HIGH (ref 70–99)
Glucose-Capillary: 117 mg/dL — ABNORMAL HIGH (ref 70–99)
Glucose-Capillary: 117 mg/dL — ABNORMAL HIGH (ref 70–99)

## 2018-02-18 MED ORDER — FUROSEMIDE 40 MG PO TABS: 20.0000 mg | ORAL_TABLET | Freq: Every day | ORAL | 3 refills | Status: DC

## 2018-02-18 MED ORDER — PANTOPRAZOLE SODIUM 40 MG PO TBEC: 40.0000 mg | DELAYED_RELEASE_TABLET | Freq: Every day | ORAL | 0 refills | Status: DC

## 2018-02-18 MED ORDER — OXYCODONE-ACETAMINOPHEN 5-325 MG PO TABS: 1.0000 | ORAL_TABLET | Freq: Four times a day (QID) | ORAL | 0 refills | Status: DC | PRN

## 2018-02-18 MED ORDER — TRAZODONE HCL 50 MG PO TABS: 25.0000 mg | ORAL_TABLET | Freq: Every day | ORAL | 0 refills | Status: DC

## 2018-02-18 MED ORDER — POTASSIUM CHLORIDE ER 10 MEQ PO TBCR: 20.0000 meq | EXTENDED_RELEASE_TABLET | Freq: Every day | ORAL | 0 refills | Status: DC

## 2018-02-18 NOTE — Progress Notes (Signed)
Occupational Therapy Discharge Summary  Patient Details  Name: [name] MRN: [id number] Date of Birth: [birth year]  Today's Date: 02/18/2018 OT Individual Time:  - 1100-1203 and 8119-1478 Individual Treatment Time Calculation: 63 min and 59 min    Patient has met 7 of 8 long term goals due to improved activity tolerance, improved balance, ability to compensate for deficits, improved awareness and improved coordination.  Patient to discharge at overall Supervision level.  Spouse Hassan Rowan has had hands on family education to provide the necessary supervision-min assist at home.    Pt still requires Min A for toileting and therefore did not met supervision/setup toileting goal. Spouse Hassan Rowan exhibited the ability to provide this assist utilizing squat-stand technique during tx.   Recommendation:  Patient will benefit from ongoing skilled OT services in home health setting to continue to advance functional skills in the area of iADL.  Equipment: shower chair + drop arm BSC  Reasons for discharge: treatment goals met and discharge from hospital  Patient/family agrees with progress made and goals achieved: Yes   Skilled Therapeutic Intervention:  Pt greeted in bed and reported pain was "good." Agreeable to complete bathing (excluding perihygiene) EOB, and doff/don (the same) clothes. He did so with setup-Mod I. Issued pt with [place] mirror and educated pt on daily residual limb inspection and Lt foot inspection. Pt with redness on Lt toe which he attributed to wall strike in shower. RN made aware. He completed squat pivot<drop arm BSC (his DME for home) with supervision. Pt with B+B void, able to complete hygiene and clothing mgt with steady assist for standing balance as needed, as lateral leans were too challenging for lifting pants over hips. He completed stand pivot transfer back to bed, and then completed squat pivot<w/c. Pt self propelled to dayroom with increased time for UB strengthening.  Discussed leisure pursuits post d/c to maintain current functional level and increase psychosocial wellbeing. He expressed past interest in constructing model airplanes. Also educated pt on simple exercises he could do at home including w/c push ups and therex using kitchen items. Pt practiced w/c pushups during session with vcs for locking DME prior to attempting. He self propelled back to room and transferred to bed. Pt left with lunch, safety bell, and bed alarm set.   Pt also provided with anti-nausea aromatherapy blend to ease nausea during session (with pt consent).   2nd Session 1:1 tx (59 min) Pt greeted in bed with spouse Hassan Rowan present. Discussed OT recommendations for home in prep for d/c tomorrow. Educated [name] to setup pt for ADL EOB so that he could utilize lean techniques for LB. Discussed deferring showering until [facility] practices shower transfer with them due to safety concerns. They both verbalized understanding. Also reviewed use of LH mirror and daily skin inspection for diabetic care. Hands on family education with drop arm BSC transfers. Hassan Rowan required vcs for stabilizing equipment and body position when supervising pt during transfer. Pt able to direct care with min vcs, fading to no vcs with repetitive practice (BSC + w/c). Discussed safest techniques for clothing mgt during toileting on BSC. Pt able to use theraband in simulated manner to elevate/lower band over hips. Also practiced completing small squat-stand with spouse lowering/elevating pants (as he reports lateral leans are too difficult to get pants back up post toileting). Emphasized with both pt and spouse to utilize scoots, lateral leans, and small squats during functional tasks at home to maximize safety. They both verbalized understanding. Pt left in bed  with spouse and RN at end of tx.     Pt with 2 skin tears during session. Please see safety zone for details. RN in to assess twice during tx. OT issued him elbow  protectors for preventative care at home.   OT Discharge Precautions/Restrictions  Precautions Precaution Comments: Rt AKA Restrictions Weight Bearing Restrictions: Yes RLE Weight Bearing: Non weight bearing Pain: in residual limb during 2nd tx. Per pt, premedicated  Pain Assessment Pain Scale: 0-10 Pain Score: 0-No pain ADL ADL Eating: Not assessed Grooming: Modified independent Where Assessed-Grooming: Wheelchair, Sitting at sink Upper Body Bathing: Setup Where Assessed-Upper Body Bathing: Edge of bed Lower Body Bathing: Setup Where Assessed-Lower Body Bathing: Bed level, Edge of bed Upper Body Dressing: Modified independent (Device) Where Assessed-Upper Body Dressing: Edge of bed Lower Body Dressing: Modified independent Where Assessed-Lower Body Dressing: Bed level, Edge of bed Toileting: Minimal assistance Where Assessed-Toileting: Bedside Commode Toilet Transfer: Close supervision Toilet Transfer Method: Engineer, water: Drop arm bedside Microbiologist: Close supervision Social research officer, government Method: Radiographer, therapeutic: Grab bars, Gaffer Baseline Vision/History: Wears glasses Wears Glasses: Reading only Patient Visual Report: No change from baseline Perception  Perception: Within Functional Limits Praxis Praxis: Intact Cognition Overall Cognitive Status: Within Functional Limits for tasks assessed Arousal/Alertness: Awake/alert Orientation Level: Oriented X4 Sustained Attention: Appears intact Memory: Appears intact Awareness: Appears intact Problem Solving: Appears intact Safety/Judgment: Appears intact Sensation Sensation Light Touch: Appears Intact Proprioception: Appears Intact Coordination Gross Motor Movements are Fluid and Coordinated: Yes Fine Motor Movements are Fluid and Coordinated: Yes Motor  Motor Motor: Other (comment) Motor - Discharge Observations:  Generalized weakness, however improved since time of eval Mobility  Squat pivot BSC transfer< supervision assist  Trunk/Postural Assessment  Cervical Assessment Cervical Assessment: Exceptions to WFL(forward head) Thoracic Assessment Thoracic Assessment: Exceptions to WFL(kyphotic) Lumbar Assessment Lumbar Assessment: Exceptions to WFL(posterior pelvic tilt) Postural Control Postural Control: Within Functional Limits  Balance Balance Balance Assessed: Yes Dynamic Sitting Balance Dynamic Sitting - Balance Support: No upper extremity supported;Feet supported Dynamic Sitting - Level of Assistance: 6: Modified independent (Device/Increase time)- LB dressing Extremity/Trunk Assessment  R UE: WFL L UE: WFL     Jen Benedict A Carleah Yablonski 02/18/2018, 12:19 PM

## 2018-02-18 NOTE — Progress Notes (Signed)
Physical Therapy Session Note  Patient Details  Name: [name] MRN: [id number] Date of Birth: [birth year]  Today's Date: 02/18/2018 PT Individual Time: 0805-0900 PT Individual Time Calculation (min): 55 min   Short Term Goals: Week 1:  PT Short Term Goal 1 (Week 1): =LTG due to ELOS  Skilled Therapeutic Interventions/Progress Updates:    Pt received sitting in [facility] and agreeable to PT  Transfer training to and from mat table with supervision assist from PT using squat pivot technique. Sit<>supine on mat table without cues or assist from PT but increased time required for processing and safety. PT instructed pt bed level HEP with hand out provided. glute sets, bridge from thighs, hip extension in sidelying.   Gait training with RW x 46f with CGA   WC mobility training performed throughout rehab unit with BUE and  LLE without cues or assist from PT [id number] Car transfer with min assist from PT for safety [facility].   Pt declined attempt to perform stair training stating that he had done that in previous treatment sessions and would have family present to lift him into house.   Pt reports need for BM. Toilet transfer with supervision assist and min cues for set up and safety.   Pt returned to room and performed squat  Pivot transfer to bed with supervision assist. Sit>supine completed without assist and left supine in bed with call bell in reach and all needs met.         Therapy Documentation Precautions:  Precautions Precautions: Fall Precaution Comments: Rt AKA Restrictions Weight Bearing Restrictions: Yes RLE Weight Bearing: Non weight bearing   Pain: Pain Assessment Pain Scale: 0-10 Pain Score: 0-No pain   Therapy/Group: Individual Therapy  AustLorie Phenix0/2020, 9:07 AM

## 2018-02-18 NOTE — Discharge Instructions (Signed)
Inpatient Rehab Discharge Instructions  SAMIEL PEEL Discharge date and time:  02/18/17  Activities/Precautions/ Functional Status: Activity: no lifting, driving, or strenuous exercise for till cleared by MD Diet: cardiac diet and diabetic diet Wound Care: Wash with soap and water, at dry and keep dry dressing on incision. Contact Dr. Scot Dock if you develop any problems with your incision/wound--redness, swelling, increase in pain, drainage or if you develop fever or chills.    Functional status:  ___ No restrictions     ___ Walk up steps independently _X__ 24/7 supervision/assistance   ___ Walk up steps with assistance ___ Intermittent supervision/assistance  ___ Bathe/dress independently ___ Walk with walker     ___ Bathe/dress with assistance ___ Walk Independently    ___ Shower independently ___ Walk with assistance    ___ Shower with assistance _X__ No alcohol     ___ Return to work/school ________    COMMUNITY REFERRALS UPON DISCHARGE:    Home Health:   PT     OT    RN                     Agency:  [facility]     Phone: [phone]   Medical Equipment/Items Ordered:  Wheelchair, cushion, drop arm commode and tub seat                                                      Agency/Supplier:  [facility] @ [phone]    GENERAL COMMUNITY RESOURCES FOR PATIENT/FAMILY:  Support Groups:  Amputee Support Group (see handout)     Special Instructions:    My questions have been answered and I understand these instructions. I will adhere to these goals and the provided educational materials after my discharge from the hospital.  Patient/Caregiver Signature _______________________________ Date __________  Clinician Signature _______________________________________ Date __________  Please bring this form and your medication list with you to all your follow-up doctor's appointments.

## 2018-02-18 NOTE — Progress Notes (Signed)
Occupational Therapy Session Note  Patient Details  Name: OSMOND STECKMAN MRN: [id number] Date of Birth: 07-Nov-1940  Today's Date: 02/18/2018 OT Individual Time: 1300-1330 OT Individual Time Calculation (min): 30 min   Skilled Therapeutic Interventions/Progress Updates: Patient requesting to toilet - completed toilet transfer via w/c elevated seat and grab bar with close S;  Toileting, including assist for clothing mgmt (patient able to pull pants down and cleanse after BM) with Min A for safety for balance;   Otherwise, he practiced donning doffing shoes with setup and no losses of balance (sitting edge of bed)  Call bell and phone within reach at end of the session     Therapy Documentation Precautions:  Precautions Precautions: Fall Precaution Comments: Rt AKA Restrictions Weight Bearing Restrictions: Yes RLE Weight Bearing: Non weight bearing Pain:5/10 sharp, spasmodic pain RN gave pain meds upon patient request   Therapy/Group: Individual Therapy  Alfredia Ferguson [facility] 02/18/2018, 8:22 PM

## 2018-02-18 NOTE — Progress Notes (Signed)
Social Work  Discharge Note  The overall goal for the admission was met for:   Discharge location: Yes - home with wife who can provide 24/7 assistance  Length of Stay: Yes - 9 days (with discharge on 02/19/18)  Discharge activity level: Yes - supervision/ min assist  Home/community participation: Yes  Services provided included: MD, RD, PT, OT, RN, TR, Pharmacy and [place]: Medicare and Private Insurance: [employer]  Follow-up services arranged: Home Health: Therapist, sports, PT, OT via [facility], DME: 16x16 lightweight w/c, cushion, drop arm commode and tub seat via [facility] and Patient/Family has no preference for HH/DME agencies  Comments (or additional information):  Patient/Family verbalized understanding of follow-up arrangements: Yes  Individual responsible for coordination of the follow-up plan: pt  Confirmed correct DME delivered: Shirle Provencal 02/18/2018    Dyke Weible

## 2018-02-18 NOTE — Discharge Summary (Signed)
Physician Discharge Summary  Patient ID: [name] MRN: [id number] DOB/AGE: 07-28-1940 78 y.o.  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H[facility])   Chronic anticoagulation   Acute on chronic blood loss anemia   Leukocytosis   Atherosclerosis of native coronary artery of native heart without angina pectoris   Hypokalemia   Stage 3 chronic kidney disease ([facility])   Discharged Condition: Stable  Significant Diagnostic Studies: N/A   Labs:  Basic Metabolic Panel: BMP Latest Ref Rng & Units 02/17/2018 02/11/2018 02/10/2018  Glucose 70 - 99 mg/dL 96 97 115(H)  BUN 8 - 23 mg/dL 11 14 18   Creatinine 0.61 - 1.24 mg/dL 1.67(H) 1.62(H) 1.87(H)  BUN/Creat Ratio 10 - 24 - - -  Sodium 135 - 145 mmol/L 137 138 139  Potassium 3.5 - 5.1 mmol/L 3.3(L) 3.4(L) 3.8  Chloride 98 - 111 mmol/L 102 105 103  CO2 22 - 32 mmol/L 27 25 25   Calcium 8.9 - 10.3 mg/dL 8.2(L) 8.4(L) 8.8(L)    CBC: CBC Latest Ref Rng & Units 02/11/2018 02/10/2018 02/09/2018  WBC 4.0 - 10.5 K/uL 8.8 13.1(H) 16.8(H)  Hemoglobin 13.0 - 17.0 g/dL 9.2(L) 8.4(L) 8.7(L)  Hematocrit 39.0 - 52.0 % 28.0(L) 25.1(L) 25.7(L)  Platelets 150 - 400 K/uL 261 216 235    CBG: Recent Labs  Lab 02/18/18 0705 02/18/18 1125 02/18/18 1658 02/18/18 2121 02/19/18 0625  GLUCAP 101* 122* 117* 117* 99    Brief HPI:   [name] is a 78 year old male with history of CKD, CML, MG, 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  He underwent right/AKA by Dr. Doren Custard on 02/07/2018 and pain is slowly improving postop.  Therapies initiated and patient was noted to have decline in functional status therefore CIR recommended for follow-up therapy  Hospital Course: [name]  was admitted to rehab 02/10/2018 for inpatient therapies to consist of PT and OT at least three hours five days a week. Past admission physiatrist, therapy team and rehab RN have worked together to provide customized collaborative inpatient rehab.  Pain control is improving with as needed use of oxycodone.  He has bee educated on addiction risks as well as importance of tapering use after discharge. Sleep-wake disruption has been managed with use of trazodone.  Diabetes has been monitored with AC/HS CBG checks and has been well controlled.  Protein supplements were added to help with low calorie malnutrition as well as promote wound healing.  P.o. intake is stable however he has had intermittent issues with N/V felt to be due to gastroparesis as well as side effects of Gleevec.    Blood pressures have been monitored on twice daily basis and have been stable.  Furosemide was resumed on 1/3 to help manage peripheral edema and blood pressure.  Follow-up labs of 01/09 revealed hypokalemia therefore K- Dur was resumed at  20 mEq daily.  He will need repeat BMET in 1 to 2 weeks to monitor for resolution. He continues on Eliquis as well as Plavix without SE. Follow-up CBC shows that reactive leukocytosis has resolved and acute blood loss anemia is improving.  Patient's mood has been stable and he has made steady progress during his rehab stay.  He has progressed to supervision level  and will continue to receive further follow-up home health PT/OT/RN  By [facility] after discharge   Rehab course: During patient's stay in rehab team conference was held to monitor patient's progress, set goals and discuss barriers to discharge. At admission, patient required min assist for ADL tasks and min to mod assist for mobility.  He  has had improvement in activity tolerance, balance, postural control as well as ability to compensate for deficits. He requires supervision for bathing and dressing tasks.  He requires min assist  for toileting.  He is independent for transfers.  He requires contact-guard assist to ambulate 20 feet with rolling walker. Family education was completed regarding all aspects of care and mobility.    Disposition: Home  Diet: Heart healthy carb modified  Special Instructions: 1. Clean incision with soap and water. Pat dry and apply stump shrinker. 2. Will need CBC/BMET in 1-2 weeks to follow up on ABLS and hhypokalemia.  3.  Monitor blood sugars 2-3 times a day.  Discharge Instructions    Ambulatory referral to Physical Medicine Rehab   Complete by:  As directed    1-2 weeks transitional care appt     Allergies as of 02/19/2018      Reactions   Other Other (See Comments)   Regarding scans, the PATIENT IS CLAUSTROPHOBIC!!!   Phenergan [promethazine Hcl] Swelling, Other (See Comments)   Cannot be combined with Zofran because swelling of the eyes resulted and patient became very restless-   Zofran [ondansetron Hcl] Swelling, Other (See Comments)   Cannot be combined with Phenergan because swelling of the eyes resulted and patient became very restless-   Sulfamethoxazole Rash, Itching      Medication List    STOP taking these medications   lisinopril 10 MG tablet Commonly known as:  PRINIVIL,ZESTRIL   loperamide 2 MG capsule Commonly known as:  IMODIUM   oxyCODONE 5 MG immediate release tablet Commonly known as:  Oxy IR/ROXICODONE   traMADol 50 MG tablet Commonly known as:  ULTRAM     TAKE these medications   acetaminophen 325 MG tablet Commonly known as:  TYLENOL Take 1-2 tablets (325-650 mg total) by mouth every 4 (four) hours as needed for mild pain.   cholecalciferol 1000 units tablet Commonly known as:  VITAMIN D Take 1,000 Units by mouth daily.   clopidogrel 75 MG tablet Commonly known as:  PLAVIX Take 1 tablet (75 mg total) by mouth daily.   COMBIGAN 0.2-0.5 % ophthalmic solution Generic drug:  brimonidine-timolol Place 1 drop into both eyes 2 (two)  times daily.   ELIQUIS 5 MG Tabs tablet Generic drug:  apixaban TAKE 1 TABLET (5 MG TOTAL) BY MOUTH 2 (TWO) TIMES DAILY.   furosemide 40 MG tablet Commonly known as:  LASIX Take 0.5 tablets (20 mg total) by mouth daily. What changed:  how much to take   imatinib 100 MG tablet Commonly known as:  GLEEVEC Take 300 mg by mouth daily with lunch. Take with noon meal and a large glass of water.Caution:Chemotherapy   isosorbide mononitrate 30 MG 24 hr tablet Commonly known as:  IMDUR Take 1 tablet (30 mg total) by mouth daily.   lidocaine 5 % Commonly known as:  LIDODERM Place 1 patch onto the skin daily as needed (for back pain). Remove & Discard patch within 12 hours or as directed by MD   multivitamin tablet Take 1 tablet by mouth daily.   oxyCODONE-acetaminophen 5-325 MG tablet--Rx # 30 pills Commonly known  as:  PERCOCET/ROXICET Take 1-2 tablets by mouth every 6 (six) hours as needed for severe pain. What changed:    how much to take  reasons to take this Notes to patient:  Max 4 pills daily   pantoprazole 40 MG tablet Commonly known as:  PROTONIX Take 1 tablet (40 mg total) by mouth daily.   polyethylene glycol packet Commonly known as:  MIRALAX / GLYCOLAX Take 17 g by mouth 2 (two) times daily.   potassium chloride 10 MEQ tablet Commonly known as:  K-DUR Take 2 tablets (20 mEq total) by mouth daily. What changed:  how much to take   predniSONE 1 MG tablet Commonly known as:  DELTASONE TAKE 3 TABLETS BY MOUTH ONCE DAILY WITH BREAKFAST What changed:  See the new instructions.   pyridostigmine 60 MG tablet Commonly known as:  MESTINON Take 60 mg by mouth daily.   rosuvastatin 10 MG tablet Commonly known as:  CRESTOR Take 5 mg by mouth daily.   traZODone 50 MG tablet Commonly known as:  DESYREL Take 0.5-1 tablets (25-50 mg total) by mouth at bedtime.      Follow-up Information    Meredith Staggers, MD Follow up.   Specialty:  Physical Medicine and  Rehabilitation Why:  Office will call you with follow up appointment Contact information: [address] [place] [address] [phone]        [name], MD Follow up on 03/16/2018.   Specialties:  Vascular Surgery, Cardiology Why:  Appointment at 4 pm for post op check/staple removal Contact information: [place] [address] [phone]        Libby Maw, MD Follow up.   Specialty:  Family Medicine Contact information: [name] [address] [phone]        Sherren Mocha, MD .   Specialty:  Cardiology Contact information: [phone] [address] [place] [address] [phone]           Signed: Bary Leriche 02/20/2018, 12:47 PM

## 2018-02-18 NOTE — Progress Notes (Signed)
[facility] PHYSICAL MEDICINE & REHABILITATION PROGRESS NOTE   Subjective/Complaints: No new issues. Up with therapy already this morning when I arrived.   ROS: Patient denies fever, rash, sore throat, blurred vision, nausea, vomiting, diarrhea, cough, shortness of breath or chest pain, joint or back pain, headache, or mood change.    Objective:   No results found. No results for input(s): WBC, HGB, HCT, PLT in the last 72 hours. Recent Labs    02/17/18 0542  NA 137  K 3.3*  CL 102  CO2 27  GLUCOSE 96  BUN 11  CREATININE 1.67*  CALCIUM 8.2*    Intake/Output Summary (Last 24 hours) at 02/18/2018 0943 Last data filed at 02/18/2018 0831 Gross per 24 hour  Intake 360 ml  Output 600 ml  Net -240 ml     Physical Exam: Vital Signs Blood pressure (!) 111/42, pulse 96, temperature 98.6 F (37 C), resp. rate 19, height 5\' 7"  (1.702 m), weight 71.1 kg, SpO2 100 %. Constitutional: No distress . Vital signs reviewed. HEENT: EOMI, oral membranes moist Neck: supple Cardiovascular: RRR without murmur. No JVD    Respiratory: CTA Bilaterally without wheezes or rales. Normal effort    GI: BS +, non-tender, non-distended  Musculoskeletal:  General: Edemapresent. . Comments: Shrinker in place and fitting appropraitely Neurological: He is alertand oriented to person, place, and time. Nocranial nerve deficit. UE 5/5. RLE: 4-/5 HF,  LLE 4+/5 prox to distal. Skin: AKA wound CDI Psychiatric: pleasant.    Assessment/Plan: 1. Functional deficits secondary to right aka which require 3+ hours per day of interdisciplinary therapy in a comprehensive inpatient rehab setting.  Physiatrist is providing close team supervision and 24 hour management of active medical problems listed below.  Physiatrist and rehab team continue to assess barriers to discharge/monitor patient progress toward functional and medical goals  Care Tool:  Bathing    Body parts bathed by patient:  Right arm, Left arm, Chest, Abdomen, Front perineal area, Buttocks, Right upper leg, Left upper leg, Face, Left lower leg, Right lower leg   Body parts bathed by helper: Left lower leg Body parts n/a: Right lower leg   Bathing assist Assist Level: Supervision/Verbal cueing     Upper Body Dressing/Undressing Upper body dressing   What is the patient wearing?: Pull over shirt    Upper body assist Assist Level: Set up assist    Lower Body Dressing/Undressing Lower body dressing      What is the patient wearing?: Pants, Ace wrap/stump shrinker     Lower body assist Assist for lower body dressing: Minimal Assistance - Patient > 75%     Toileting Toileting    Toileting assist Assist for toileting: Minimal Assistance - Patient > 75% Assistive Device Comment: (urinal patient spills requiring pad change)   Transfers Chair/bed transfer  Transfers assist     Chair/bed transfer assist level: Independent with assistive device     Locomotion Ambulation   Ambulation assist      Assist level: Contact Guard/Touching assist Assistive device: Walker-rolling Max distance: 55ft   Walk 10 feet activity   Assist  Walk 10 feet activity did not occur: Safety/medical concerns  Assist level: Contact Guard/Touching assist Assistive device: Walker-rolling   Walk 50 feet activity   Assist Walk 50 feet with 2 turns activity did not occur: Safety/medical concerns         Walk 150 feet activity   Assist Walk 150 feet activity did not occur: Safety/medical concerns  Walk 10 feet on uneven surface  activity   Assist Walk 10 feet on uneven surfaces activity did not occur: Safety/medical concerns         Wheelchair     Assist Will patient use wheelchair at discharge?: Yes Type of Wheelchair: Manual    Wheelchair assist level: Independent Max wheelchair distance: 262ft     Wheelchair 50 feet with 2 turns activity    Assist        Assist  Level: Independent   Wheelchair 150 feet activity     Assist Wheelchair 150 feet activity did not occur: Safety/medical concerns   Assist Level: Independent    Medical Problem List and Plan: 1.Functional deficitssecondary to PAD, right AKA. -Continue CIR therapies including PT, OT ---ELOS 1/11 -prosthetic education  -biotech shrinkers 2.PE/DVT Anticoagulation:Pharmaceutical:Other (comment)Eliquis resumed today. Monitor hemoglobin check CBC on 02/14/2018 3. Pain Management:Oxycodone prn effective. 4. Mood:LCSW to follow for evaluation and support 5. Neuropsych: This patientiscapable of making decisions on hisown behalf. 6. Skin/Wound Care:  -Continue protein supplement.  -shrinker 7. Fluids/Electrolytes/Nutrition:encourage PO  -resumed supplement potassium58meq daily   -3.3 1/9  8.Acute on chronic CKD:    -Cr 1.67 1.9 9. OM:BTDHRCB on mestinon andchronic steriods. 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 Frequency: still voiding frequently  -likely lasix related- dose was reduced to 20mg  prior to admit  - ua/ucx negative  -scans low 12. Acute on chronic anemia: Continue to monitor. 13. Leukocytosis: Resolved 14. HTN: Monitor BP bid--continue Lasix and Imdur.  Vitals:   02/17/18 0537 02/17/18 1938  BP: (!) 139/55 (!) 111/42  Pulse: 85 96  Resp: 18 19  Temp: 99.6 F (37.6 C) 98.6 F (37 C)  SpO2: 98% 100%  Blood pressure controlled 1/10 15. Insomnia: Trazodone added to help with sleep wake cycle.  16. PVD s/p stenting: On Plavix and Crestor.    LOS: 8 days A FACE TO FACE EVALUATION WAS PERFORMED  Meredith Staggers 02/18/2018, 9:43 AM

## 2018-02-19 DIAGNOSIS — E876 Hypokalemia: Secondary | ICD-10-CM

## 2018-02-19 DIAGNOSIS — N183 Chronic kidney disease, stage 3 unspecified: Secondary | ICD-10-CM

## 2018-02-19 DIAGNOSIS — I2510 Atherosclerotic heart disease of native coronary artery without angina pectoris: Secondary | ICD-10-CM

## 2018-02-19 LAB — GLUCOSE, CAPILLARY: Glucose-Capillary: 99 mg/dL (ref 70–99)

## 2018-02-19 NOTE — Progress Notes (Signed)
Patient discharged to home per wheelchair accompanied by RN;NT and family. Discharged instructions done by PA yesterday and RN reinforced discharge instructions to wife. Discharge papers signed by wife for the patient. No further questions.

## 2018-02-19 NOTE — Progress Notes (Signed)
[facility] PHYSICAL MEDICINE & REHABILITATION PROGRESS NOTE   Subjective/Complaints: Patient seen laying in bed this morning.  States he slept well overnight.  He alert for discharge.  He has questions regarding blood thinners.  ROS: Denies CP, SOB, N/V/D  Objective:   No results found. No results for input(s): WBC, HGB, HCT, PLT in the last 72 hours. Recent Labs    02/17/18 0542  NA 137  K 3.3*  CL 102  CO2 27  GLUCOSE 96  BUN 11  CREATININE 1.67*  CALCIUM 8.2*    Intake/Output Summary (Last 24 hours) at 02/19/2018 1033 Last data filed at 02/19/2018 0700 Gross per 24 hour  Intake 580 ml  Output 600 ml  Net -20 ml     Physical Exam: Vital Signs Blood pressure (!) 126/59, pulse 84, temperature 99.1 F (37.3 C), temperature source Oral, resp. rate 18, height 5\' 7"  (1.702 m), weight 71.1 kg, SpO2 99 %. Constitutional: No distress . Vital signs reviewed. HENT: Normocephalic.  Atraumatic. Eyes: EOMI. No discharge. Cardiovascular: RRR. No JVD. Respiratory: CTA Bilaterally. Normal effort. GI: BS +. Non-distended. Musc: Stump edema Neurological: He is alertand oriented to person, place, and time. Nocranial nerve deficit.   Motor: UE 5/5. RLE: 4-/5 HF,  LLE 4+/5 prox to distal. Skin: AKA with shrinker CDI Psychiatric: pleasant.    Assessment/Plan: 1. Functional deficits secondary to right aka which require 3+ hours per day of interdisciplinary therapy in a comprehensive inpatient rehab setting.  Physiatrist is providing close team supervision and 24 hour management of active medical problems listed below.  Physiatrist and rehab team continue to assess barriers to discharge/monitor patient progress toward functional and medical goals  Care Tool:  Bathing    Body parts bathed by patient: Right arm, Left arm, Chest, Abdomen, Right upper leg, Left upper leg, Face, Left lower leg, Right lower leg   Body parts bathed by helper: Left lower leg Body parts n/a:  Buttocks, Front perineal area   Bathing assist Assist Level: Set up assist     Upper Body Dressing/Undressing Upper body dressing   What is the patient wearing?: Pull over shirt    Upper body assist Assist Level: Independent with assistive device    Lower Body Dressing/Undressing Lower body dressing      What is the patient wearing?: Pants, Ace wrap/stump shrinker     Lower body assist Assist for lower body dressing: Independent with assitive device     Toileting Toileting    Toileting assist Assist for toileting: Minimal Assistance - Patient > 75% Assistive Device Comment: (urinal patient spills requiring pad change)   Transfers Chair/bed transfer  Transfers assist     Chair/bed transfer assist level: Independent with assistive device     Locomotion Ambulation   Ambulation assist      Assist level: Contact Guard/Touching assist Assistive device: Walker-rolling Max distance: 23ft   Walk 10 feet activity   Assist  Walk 10 feet activity did not occur: Safety/medical concerns  Assist level: Contact Guard/Touching assist Assistive device: Walker-rolling   Walk 50 feet activity   Assist Walk 50 feet with 2 turns activity did not occur: Safety/medical concerns         Walk 150 feet activity   Assist Walk 150 feet activity did not occur: Safety/medical concerns         Walk 10 feet on uneven surface  activity   Assist Walk 10 feet on uneven surfaces activity did not occur: Safety/medical concerns  Wheelchair     Assist Will patient use wheelchair at discharge?: Yes Type of Wheelchair: Manual    Wheelchair assist level: Independent Max wheelchair distance: 250ft     Wheelchair 50 feet with 2 turns activity    Assist        Assist Level: Independent   Wheelchair 150 feet activity     Assist Wheelchair 150 feet activity did not occur: Safety/medical concerns   Assist Level: Independent    Medical  Problem List and Plan: 1.Functional deficitssecondary to PAD, right AKA.  DC today  Patient follow-up for transitional care management in 1 to 2 weeks postdischarge -prosthetic education  -biotech shrinkers 2.PE/DVT Anticoagulation:Pharmaceutical:Other (comment)Eliquis resumed.  3. Pain Management:Oxycodone prn effective. 4. Mood:LCSW to follow for evaluation and support 5. Neuropsych: This patientiscapable of making decisions on hisown behalf. 6. Skin/Wound Care:  -Continue protein supplement.  -shrinker 7. Fluids/Electrolytes/Nutrition:encourage PO  -resumed supplement potassium28meq daily   -3.3 on 1/9, will need ambulatory follow-up 8.CKD:    -Cr 1.67 on 1.9 9. TD:HRCBULA on mestinon andchronic steriods. 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 Frequency: still voiding frequently  -likely lasix related- dose was reduced to 20mg  prior to admit  - ua/ucx negative  -scans low 12. Acute on chronic anemia: Continue to monitor. 13. Leukocytosis: Resolved 14. HTN: Monitor BP bid--continue Lasix and Imdur.  Vitals:   02/18/18 2008 02/19/18 0419  BP: (!) 146/56 (!) 126/59  Pulse: 85 84  Resp:  18  Temp: 98.5 F (36.9 C) 99.1 F (37.3 C)  SpO2: 100% 99%  Blood pressure relatively controlled on 1/11 15. Insomnia: Trazodone added to help with sleep wake cycle.  16. PVD s/p stenting: On Plavix and Crestor.    LOS: 9 days A FACE TO FACE EVALUATION WAS PERFORMED   Lorie Phenix 02/19/2018, 10:32 AM

## 2018-02-21 ENCOUNTER — Telehealth: Payer: Self-pay | Admitting: Behavioral Health

## 2018-02-21 DIAGNOSIS — G4700 Insomnia, unspecified: Secondary | ICD-10-CM | POA: Diagnosis not present

## 2018-02-21 DIAGNOSIS — N183 Chronic kidney disease, stage 3 (moderate): Secondary | ICD-10-CM | POA: Diagnosis not present

## 2018-02-21 DIAGNOSIS — Z86718 Personal history of other venous thrombosis and embolism: Secondary | ICD-10-CM | POA: Diagnosis not present

## 2018-02-21 DIAGNOSIS — M5412 Radiculopathy, cervical region: Secondary | ICD-10-CM | POA: Diagnosis not present

## 2018-02-21 DIAGNOSIS — G7000 Myasthenia gravis without (acute) exacerbation: Secondary | ICD-10-CM | POA: Diagnosis not present

## 2018-02-21 DIAGNOSIS — I2510 Atherosclerotic heart disease of native coronary artery without angina pectoris: Secondary | ICD-10-CM | POA: Diagnosis not present

## 2018-02-21 DIAGNOSIS — M1991 Primary osteoarthritis, unspecified site: Secondary | ICD-10-CM | POA: Diagnosis not present

## 2018-02-21 DIAGNOSIS — Z7901 Long term (current) use of anticoagulants: Secondary | ICD-10-CM | POA: Diagnosis not present

## 2018-02-21 DIAGNOSIS — Z86711 Personal history of pulmonary embolism: Secondary | ICD-10-CM | POA: Diagnosis not present

## 2018-02-21 DIAGNOSIS — E1122 Type 2 diabetes mellitus with diabetic chronic kidney disease: Secondary | ICD-10-CM | POA: Diagnosis not present

## 2018-02-21 DIAGNOSIS — I6529 Occlusion and stenosis of unspecified carotid artery: Secondary | ICD-10-CM | POA: Diagnosis not present

## 2018-02-21 DIAGNOSIS — M810 Age-related osteoporosis without current pathological fracture: Secondary | ICD-10-CM | POA: Diagnosis not present

## 2018-02-21 DIAGNOSIS — E1151 Type 2 diabetes mellitus with diabetic peripheral angiopathy without gangrene: Secondary | ICD-10-CM | POA: Diagnosis not present

## 2018-02-21 DIAGNOSIS — C9211 Chronic myeloid leukemia, BCR/ABL-positive, in remission: Secondary | ICD-10-CM | POA: Diagnosis not present

## 2018-02-21 DIAGNOSIS — D631 Anemia in chronic kidney disease: Secondary | ICD-10-CM | POA: Diagnosis not present

## 2018-02-21 DIAGNOSIS — Z4781 Encounter for orthopedic aftercare following surgical amputation: Secondary | ICD-10-CM | POA: Diagnosis not present

## 2018-02-21 DIAGNOSIS — I129 Hypertensive chronic kidney disease with stage 1 through stage 4 chronic kidney disease, or unspecified chronic kidney disease: Secondary | ICD-10-CM | POA: Diagnosis not present

## 2018-02-21 DIAGNOSIS — S51811D Laceration without foreign body of right forearm, subsequent encounter: Secondary | ICD-10-CM | POA: Diagnosis not present

## 2018-02-21 DIAGNOSIS — G546 Phantom limb syndrome with pain: Secondary | ICD-10-CM | POA: Diagnosis not present

## 2018-02-21 DIAGNOSIS — E1142 Type 2 diabetes mellitus with diabetic polyneuropathy: Secondary | ICD-10-CM | POA: Diagnosis not present

## 2018-02-21 DIAGNOSIS — F40240 Claustrophobia: Secondary | ICD-10-CM | POA: Diagnosis not present

## 2018-02-21 DIAGNOSIS — Z8673 Personal history of transient ischemic attack (TIA), and cerebral infarction without residual deficits: Secondary | ICD-10-CM | POA: Diagnosis not present

## 2018-02-21 DIAGNOSIS — Z89611 Acquired absence of right leg above knee: Secondary | ICD-10-CM | POA: Diagnosis not present

## 2018-02-21 DIAGNOSIS — K219 Gastro-esophageal reflux disease without esophagitis: Secondary | ICD-10-CM | POA: Diagnosis not present

## 2018-02-21 DIAGNOSIS — E785 Hyperlipidemia, unspecified: Secondary | ICD-10-CM | POA: Diagnosis not present

## 2018-02-21 NOTE — Telephone Encounter (Signed)
Per patient, he declines TCM/Hospital Follow-up at this time. Patient voiced that he recently has had an amputation and trying to first adjust at home; also stating that he's unable to get in and out of the house right now. His spouse verbalized that she would like to cancel the appointment scheduled for this Friday, 02/25/18 with Dr. Ethelene Hal. Patient will reschedule at a later date.

## 2018-02-22 ENCOUNTER — Telehealth: Payer: Self-pay | Admitting: Registered Nurse

## 2018-02-22 ENCOUNTER — Telehealth: Payer: Self-pay | Admitting: *Deleted

## 2018-02-22 DIAGNOSIS — S51811D Laceration without foreign body of right forearm, subsequent encounter: Secondary | ICD-10-CM | POA: Diagnosis not present

## 2018-02-22 DIAGNOSIS — Z4781 Encounter for orthopedic aftercare following surgical amputation: Secondary | ICD-10-CM | POA: Diagnosis not present

## 2018-02-22 DIAGNOSIS — E1151 Type 2 diabetes mellitus with diabetic peripheral angiopathy without gangrene: Secondary | ICD-10-CM | POA: Diagnosis not present

## 2018-02-22 DIAGNOSIS — E1122 Type 2 diabetes mellitus with diabetic chronic kidney disease: Secondary | ICD-10-CM | POA: Diagnosis not present

## 2018-02-22 DIAGNOSIS — I129 Hypertensive chronic kidney disease with stage 1 through stage 4 chronic kidney disease, or unspecified chronic kidney disease: Secondary | ICD-10-CM | POA: Diagnosis not present

## 2018-02-22 DIAGNOSIS — E1142 Type 2 diabetes mellitus with diabetic polyneuropathy: Secondary | ICD-10-CM | POA: Diagnosis not present

## 2018-02-22 NOTE — Telephone Encounter (Signed)
Gennaro Africa, PT, [email or web address]  left a message asking for verbal orders for HHPT.  Medical record reviewed. Social work note reviewed.  Verbal orders given per office protocol.

## 2018-02-22 NOTE — Telephone Encounter (Addendum)
Transitional Care call Transitional Care Call Completed  Patient name: [name]  DOB: September 03, 1940 1. Are you/is patient experiencing any problems since coming home? Yes, [name] reports on 02/18/2018, while  sitting in his wheelchair at the hospital he developed a skin tear. He reports it bleed profusely. The area was cleansed and a dressing was applied. Since his discharged on 02/19/2018 he reports his arm has been bleeding profusely, the therapist came to his home on 02/21/2018, she changed the dressing. Today the [facility] Nurse is scheduled to assess the skin tear he reports. [name] is currently on two anticoagulants Plavix and Eliquis, he's going to call his vascular surgeon to discuss the two anticoagulants he reports. He was encouraged to continue taking his medication as prescribed until his doctors gives his permission to discontinue any of the above medication. He verbalizes understanding. [name] instructed to call office after the Nurse leaves his home today, he verbalizes understanding.  a. Are there any questions regarding any aspect of care? No 2. Are there any questions regarding medications administration/dosing? No a. Are meds being taken as prescribed? Yes, medication list reviewed.  b. "Patient should review meds with caller to confirm"  3. Have there been any falls? No 4. Has Home Health been to the house and/or have they contacted you? Yes, [facility] a. If not, have you tried to contact them? NA b. Can we help you contact them? NA 5. Are bowels and bladder emptying properly? Yes a. Are there any unexpected incontinence issues? NA b. If applicable, is patient following bowel/bladder programs? NA 6. Any fevers, problems with breathing, unexpected pain? No 7. Are there any skin problems or new areas of breakdown? Yes, see Above. 8. Has the patient/family member arranged specialty MD follow up (ie cardiology/neurology/renal/surgical/etc.)?  All except  cardiology. He will be calling Dr. Burt Knack today for an appointment he states.  a. Can we help arrange? NA 9. Does the patient need any other services or support that we can help arrange? Yes, [name] purchasing a Stair Lift in his home, he will need a prescription for a stair lift to defer [place] taxes he states. Prescription will be faxed to [facility]. Fax: [phone] 10. Are caregivers following through as expected in assisting the patient? Yes 11. Has the patient quit smoking, drinking alcohol, or using drugs as recommended? [name] denies smoking, drinking alcohol or use illicit drugs.   Appointment date/time 03/03/2018  arrival time 1:20 for 1:40 appointment with Danella Sensing ANP-C. At [place]

## 2018-02-23 DIAGNOSIS — I129 Hypertensive chronic kidney disease with stage 1 through stage 4 chronic kidney disease, or unspecified chronic kidney disease: Secondary | ICD-10-CM | POA: Diagnosis not present

## 2018-02-23 DIAGNOSIS — E1122 Type 2 diabetes mellitus with diabetic chronic kidney disease: Secondary | ICD-10-CM | POA: Diagnosis not present

## 2018-02-23 DIAGNOSIS — Z4781 Encounter for orthopedic aftercare following surgical amputation: Secondary | ICD-10-CM | POA: Diagnosis not present

## 2018-02-23 DIAGNOSIS — E1142 Type 2 diabetes mellitus with diabetic polyneuropathy: Secondary | ICD-10-CM | POA: Diagnosis not present

## 2018-02-23 DIAGNOSIS — S51811D Laceration without foreign body of right forearm, subsequent encounter: Secondary | ICD-10-CM | POA: Diagnosis not present

## 2018-02-23 DIAGNOSIS — E1151 Type 2 diabetes mellitus with diabetic peripheral angiopathy without gangrene: Secondary | ICD-10-CM | POA: Diagnosis not present

## 2018-02-25 ENCOUNTER — Telehealth: Payer: Self-pay

## 2018-02-25 ENCOUNTER — Inpatient Hospital Stay: Payer: No Typology Code available for payment source | Admitting: Family Medicine

## 2018-02-25 ENCOUNTER — Telehealth: Payer: Self-pay | Admitting: *Deleted

## 2018-02-25 DIAGNOSIS — E1151 Type 2 diabetes mellitus with diabetic peripheral angiopathy without gangrene: Secondary | ICD-10-CM | POA: Diagnosis not present

## 2018-02-25 DIAGNOSIS — I129 Hypertensive chronic kidney disease with stage 1 through stage 4 chronic kidney disease, or unspecified chronic kidney disease: Secondary | ICD-10-CM | POA: Diagnosis not present

## 2018-02-25 DIAGNOSIS — E1122 Type 2 diabetes mellitus with diabetic chronic kidney disease: Secondary | ICD-10-CM | POA: Diagnosis not present

## 2018-02-25 DIAGNOSIS — E1142 Type 2 diabetes mellitus with diabetic polyneuropathy: Secondary | ICD-10-CM | POA: Diagnosis not present

## 2018-02-25 DIAGNOSIS — S51811D Laceration without foreign body of right forearm, subsequent encounter: Secondary | ICD-10-CM | POA: Diagnosis not present

## 2018-02-25 DIAGNOSIS — Z4781 Encounter for orthopedic aftercare following surgical amputation: Secondary | ICD-10-CM | POA: Diagnosis not present

## 2018-02-25 NOTE — Telephone Encounter (Signed)
Izora Gala, OT from [facility] called requesting verbal orders for [facility] 1wk1, 2wk3. Orders approved and given per discharge summary.

## 2018-02-25 NOTE — Telephone Encounter (Signed)
proceed

## 2018-02-25 NOTE — Telephone Encounter (Signed)
Verbal orders given  

## 2018-02-25 NOTE — Telephone Encounter (Signed)
Hilda Blades, RN, [facility] left a message asking for verbal orders for home health skilled nursing visits 2week3 to address a small skin tear on patients right forearm.  She states that she plans to use xeroform and a transdermal dressing. Please advise

## 2018-03-01 DIAGNOSIS — E1151 Type 2 diabetes mellitus with diabetic peripheral angiopathy without gangrene: Secondary | ICD-10-CM | POA: Diagnosis not present

## 2018-03-01 DIAGNOSIS — I129 Hypertensive chronic kidney disease with stage 1 through stage 4 chronic kidney disease, or unspecified chronic kidney disease: Secondary | ICD-10-CM | POA: Diagnosis not present

## 2018-03-01 DIAGNOSIS — E1122 Type 2 diabetes mellitus with diabetic chronic kidney disease: Secondary | ICD-10-CM | POA: Diagnosis not present

## 2018-03-01 DIAGNOSIS — E1142 Type 2 diabetes mellitus with diabetic polyneuropathy: Secondary | ICD-10-CM | POA: Diagnosis not present

## 2018-03-01 DIAGNOSIS — Z4781 Encounter for orthopedic aftercare following surgical amputation: Secondary | ICD-10-CM | POA: Diagnosis not present

## 2018-03-01 DIAGNOSIS — S51811D Laceration without foreign body of right forearm, subsequent encounter: Secondary | ICD-10-CM | POA: Diagnosis not present

## 2018-03-02 ENCOUNTER — Ambulatory Visit (INDEPENDENT_AMBULATORY_CARE_PROVIDER_SITE_OTHER): Payer: Medicare Other | Admitting: Family Medicine

## 2018-03-02 ENCOUNTER — Other Ambulatory Visit: Payer: Self-pay

## 2018-03-02 ENCOUNTER — Encounter: Payer: Self-pay | Admitting: Family Medicine

## 2018-03-02 ENCOUNTER — Ambulatory Visit (INDEPENDENT_AMBULATORY_CARE_PROVIDER_SITE_OTHER): Payer: Self-pay | Admitting: Physician Assistant

## 2018-03-02 ENCOUNTER — Ambulatory Visit: Payer: Medicare Other | Admitting: Family Medicine

## 2018-03-02 VITALS — BP 128/80 | HR 98 | Ht 67.0 in

## 2018-03-02 DIAGNOSIS — E1159 Type 2 diabetes mellitus with other circulatory complications: Secondary | ICD-10-CM | POA: Diagnosis not present

## 2018-03-02 DIAGNOSIS — M792 Neuralgia and neuritis, unspecified: Secondary | ICD-10-CM | POA: Diagnosis not present

## 2018-03-02 DIAGNOSIS — G4709 Other insomnia: Secondary | ICD-10-CM | POA: Diagnosis not present

## 2018-03-02 DIAGNOSIS — K219 Gastro-esophageal reflux disease without esophagitis: Secondary | ICD-10-CM

## 2018-03-02 DIAGNOSIS — Z89611 Acquired absence of right leg above knee: Secondary | ICD-10-CM

## 2018-03-02 DIAGNOSIS — S78111A Complete traumatic amputation at level between right hip and knee, initial encounter: Secondary | ICD-10-CM | POA: Diagnosis not present

## 2018-03-02 LAB — HEMOGLOBIN A1C: Hgb A1c MFr Bld: 5.6 % (ref 4.6–6.5)

## 2018-03-02 MED ORDER — GABAPENTIN 300 MG PO CAPS: 300.0000 mg | ORAL_CAPSULE | Freq: Every day | ORAL | 3 refills | Status: DC

## 2018-03-02 MED ORDER — PANTOPRAZOLE SODIUM 40 MG PO TBEC: 40.0000 mg | DELAYED_RELEASE_TABLET | Freq: Every day | ORAL | 0 refills | Status: DC

## 2018-03-02 MED ORDER — TRAZODONE HCL 50 MG PO TABS: 25.0000 mg | ORAL_TABLET | Freq: Every day | ORAL | 2 refills | Status: DC

## 2018-03-02 MED ORDER — OXYCODONE-ACETAMINOPHEN 5-325 MG PO TABS: 1.0000 | ORAL_TABLET | Freq: Every evening | ORAL | 0 refills | Status: DC | PRN

## 2018-03-02 NOTE — Progress Notes (Addendum)
Established Patient Office Visit  Subjective:  Patient ID: [name], male    DOB: March 24, 1940  Age: 78 y.o. MRN: [id number]  CC:  Chief Complaint  Patient presents with  . Follow-up    HPI CLEVER GERALDO presents for hospital discharge follow-up status post recent AKA on his right side.  Wound is healing nicely.  He continues to experience right ankle pain especially at night.  He has follow-up with the surgeon today.  He is actively involved with physical and Occupational Therapy.  Family friends and church members are dropping by the house.  He is accompanied today by his wife and son.  Blood sugars were slightly elevated in the hospital.  He would like a refill on the trazodone and his Protonix.  Review of the medical record shows stability with his hemoglobin, CKD and platelets.  Past Medical History:  Diagnosis Date  . Bruises easily    d/t being on Eliquis  . Carotid artery disease ([place])    a. Duplex 05/2014: 97-98% RICA, 9-21% LICA, elevated velocities in right subclavian artery, normal left subclavian artery..  . Chronic back pain    HNP  . Chronic kidney disease (CKD)   . Claustrophobia    takes Ativan as needed  . 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 Coronary atherosclerosis of unspecified type of vessel, native or graft    a. cath in 2003 showed 10-20% LM, scattered 20% prox-mid LAD, 30% more distal LAD, 50-60% stenosis of LAD towards apex, 20% Cx, 30% dRCA, focal 95% stenosis in smaller of 2 branches of PDA, EF 60-65%.  . Diabetes mellitus ([place])   . Diarrhea    takes Imodium daily as needed  . Diverticulosis   . Diverticulosis of colon (without mention of hemorrhage)   . DJD (degenerative joint disease)   . DVT (deep venous thrombosis) ([place]) 07/2011  . Esophageal stricture   . Essential hypertension    takes Imdur and Lisinopril daily  . Gastric polyps    benign  . GERD (gastroesophageal reflux disease)    takes Nexium daily  .  Glaucoma    uses eye drops  . History of bronchitis    not sure when the last time  . History of colon polyps    benign  . History of kidney stones   . History of kidney stones   . Insomnia    takes Melatonin nightly as needed  . Mixed hyperlipidemia    takes Crestor daily  . Myasthenia gravis ([place])    uses prednisone  . Osteoporosis 2016  . Peripheral edema    takes Lasix daily  . Pituitary tumor    checks Dr[facility]Walden at [facility] checks it every Jan   . Pneumonia 01/2016  . PONV (postoperative nausea and vomiting)   . Ptosis   . Pulmonary embolus ([place]) 2012  . Stroke [facility]) 06/2015    Past Surgical History:  Procedure Laterality Date  . ABDOMINAL AORTOGRAM W/LOWER EXTREMITY N/A 11/18/2016   Procedure: ABDOMINAL AORTOGRAM W/LOWER EXTREMITY;  Surgeon: [name], MD;  Location: [place] CV LAB;  Service: Cardiovascular;  Laterality: N/A;  . AMPUTATION Right 02/07/2018   Procedure: right leg AMPUTATION ABOVE KNEE;  Surgeon: [name], MD;  Location: [place];  Service: Vascular;  Laterality: Right;  . AORTOGRAM Right 12/23/2017   Procedure: AORTOGRAM, RIGHT LOWER EXTREMITY ANGIOGRAM, RIGHT SUPERFICIAL FEMORAL ARTERY ANGIOPLASTY WITH STENT, RIGHT POPLITEAL ARTERY ANGIOPLASTY WITH STENT, LEFT  GROIN CUTDOWN AND LEFT FEMORAL ARTERY REPAIR;  Surgeon: [name], MD;  Location: [place];  Service: Vascular;  Laterality: Right;  . BACK SURGERY     x2, lumbar and cervical  . CARDIAC CATHETERIZATION    . CATARACT EXTRACTION Bilateral 12/12  . COLONOSCOPY    . ESOPHAGOGASTRODUODENOSCOPY ENDOSCOPY  04/2015  . IR GENERIC HISTORICAL  03/03/2016   IR IVC FILTER PLMT / S&I [name] GUID/MOD SED 03/03/2016 [name], MD [facility]-INTERV RAD  . IR GENERIC HISTORICAL  04/09/2016   IR RADIOLOGIST EVAL & MGMT 04/09/2016 [name], MD [facility] INTERV RAD  . IR GENERIC HISTORICAL  04/30/2016   IR IVC FILTER RETRIEVAL / S&I [name] GUID/MOD SED 04/30/2016 [name], MD [facility]-INTERV RAD  .  LITHOTRIPSY    . LUMBAR LAMINECTOMY/DECOMPRESSION MICRODISCECTOMY Right 09/14/2012   Procedure: Right Lumbar three-four, four-five, Lumbar five-Sacral one decompressive laminectomy;  Surgeon: [name], MD;  Location: [place] NEURO ORS;  Service: Neurosurgery;  Laterality: Right;  . LUMBAR LAMINECTOMY/DECOMPRESSION MICRODISCECTOMY Left 03/11/2016   Procedure: Left Lumbar Two-ThreeMicrodiscectomy;  Surgeon: [name], MD;  Location: [place];  Service: Neurosurgery;  Laterality: Left;  . PERIPHERAL VASCULAR INTERVENTION  11/18/2016   Procedure: PERIPHERAL VASCULAR INTERVENTION;  Surgeon: [name], MD;  Location: [place] CV LAB;  Service: Cardiovascular;;  Left popliteal  . SHOULDER SURGERY Left 05/07/2009  . TONSILLECTOMY      Family History  Problem Relation Age of Onset  . Heart disease Mother   . Heart attack Mother   . Stroke Mother   . Heart disease Father   . Heart attack Father   . Stroke Father   . Breast cancer Sister        Twin   . Heart attack Sister   . Dementia Sister   . Heart disease Sister   . Heart attack Sister   . Clotting disorder Sister   . Heart attack Sister   . Colon cancer Neg Hx     Social History   Socioeconomic History  . Marital status: Married    Spouse name: Hassan Rowan  . Number of children: 2  . Years of education: 1-College  . Highest education level: Not on file  Occupational History  . 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 Financial resource strain: Not on file  . Food insecurity:    Worry: Not on file    Inability: Not on file  . Transportation needs:    Medical: Not on file    Non-medical: Not on file  Tobacco Use  . Smoking status: Never Smoker  . Smokeless tobacco: Never Used  Substance and Sexual Activity  . Alcohol use: No    Alcohol/week: 0.0 standard drinks  . Drug use: No  . Sexual activity: Not Currently  Lifestyle  . Physical activity:    Days per week: Not on file      Minutes per session: Not on file  . Stress: Not on file  Relationships  . Social connections:    Talks on phone: Patient refused    Gets together: Patient refused    Attends religious service: Patient refused    Active member of club or organization: Patient refused    Attends meetings of clubs or organizations: Patient refused    Relationship status: Patient refused  . Intimate partner violence:    Fear of current or ex partner: Patient refused    Emotionally abused: Patient  refused    Physically abused: Patient refused    Forced sexual activity: Patient refused  Other Topics Concern  . Not on file  Social History Narrative   Lives at home w/ his wife   Patient is right handed.   Patient has 1-2 cups of caffeine daily.    Outpatient Medications Prior to Visit  Medication Sig Dispense Refill  . acetaminophen (TYLENOL) 325 MG tablet Take 1-2 tablets (325-650 mg total) by mouth every 4 (four) hours as needed for mild pain.    . brimonidine-timolol (COMBIGAN) 0.2-0.5 % ophthalmic solution Place 1 drop into both eyes 2 (two) times daily.     . cholecalciferol (VITAMIN D) 1000 units tablet Take 1,000 Units by mouth daily.     Marland Kitchen ELIQUIS 5 MG TABS tablet TAKE 1 TABLET (5 MG TOTAL) BY MOUTH 2 (TWO) TIMES DAILY. 60 tablet 3  . furosemide (LASIX) 40 MG tablet Take 0.5 tablets (20 mg total) by mouth daily. 45 tablet 3  . imatinib (GLEEVEC) 100 MG tablet Take 300 mg by mouth daily with lunch. Take with noon meal and a large glass of water.Caution:Chemotherapy    . isosorbide mononitrate (IMDUR) 30 MG 24 hr tablet Take 1 tablet (30 mg total) by mouth daily. 90 tablet 3  . lidocaine (LIDODERM) 5 % Place 1 patch onto the skin daily as needed (for back pain). Remove & Discard patch within 12 hours or as directed by MD     . Multiple Vitamin (MULTIVITAMIN) tablet Take 1 tablet by mouth daily.    . potassium chloride (K-DUR) 10 MEQ tablet Take 2 tablets (20 mEq total) by mouth daily. 60 tablet 0   . predniSONE (DELTASONE) 1 MG tablet TAKE 3 TABLETS BY MOUTH ONCE DAILY WITH BREAKFAST (Patient taking differently: Take 3 mg by mouth daily with breakfast. ) 270 tablet 0  . pyridostigmine (MESTINON) 60 MG tablet Take 60 mg by mouth daily.    . rosuvastatin (CRESTOR) 10 MG tablet Take 5 mg by mouth daily.    . clopidogrel (PLAVIX) 75 MG tablet Take 1 tablet (75 mg total) by mouth daily. 30 tablet 6  . oxyCODONE-acetaminophen (PERCOCET/ROXICET) 5-325 MG tablet Take 1-2 tablets by mouth every 6 (six) hours as needed for severe pain. 30 tablet 0  . pantoprazole (PROTONIX) 40 MG tablet Take 1 tablet (40 mg total) by mouth daily. 30 tablet 0  . polyethylene glycol (MIRALAX / GLYCOLAX) packet Take 17 g by mouth 2 (two) times daily. 14 each 0  . traZODone (DESYREL) 50 MG tablet Take 0.5-1 tablets (25-50 mg total) by mouth at bedtime. 30 tablet 0   No facility-administered medications prior to visit.     Allergies  Allergen Reactions  . Other Other (See Comments)    Regarding scans, the PATIENT IS CLAUSTROPHOBIC!!!  . Phenergan [Promethazine Hcl] Swelling and Other (See Comments)    Cannot be combined with Zofran because swelling of the eyes resulted and patient became very restless-  . Zofran [Ondansetron Hcl] Swelling and Other (See Comments)    Cannot be combined with Phenergan because swelling of the eyes resulted and patient became very restless-  . Sulfamethoxazole Rash and Itching    ROS Review of Systems  Constitutional: Negative for chills, diaphoresis, fatigue, fever and unexpected weight change.  HENT: Negative.   Eyes: Negative for photophobia and visual disturbance.  Respiratory: Negative.   Cardiovascular: Negative.   Gastrointestinal: Negative.   Endocrine: Negative for polyphagia and polyuria.  Genitourinary: Negative.  Musculoskeletal: Positive for arthralgias and gait problem.  Allergic/Immunologic: Negative for immunocompromised state.  Neurological: Negative for  seizures, syncope, speech difficulty and light-headedness.  Hematological: Negative for adenopathy.  Psychiatric/Behavioral: Positive for sleep disturbance.      Objective:    Physical Exam  Constitutional: He is oriented to person, place, and time. He appears well-developed and well-nourished. No distress.  HENT:  Head: Normocephalic and atraumatic.  Right Ear: External ear normal.  Left Ear: External ear normal.  Eyes: Conjunctivae are normal. Right eye exhibits no discharge. Left eye exhibits no discharge. No scleral icterus.  Neck: Neck supple. No JVD present. No tracheal deviation present. No thyromegaly present.  Pulmonary/Chest: Breath sounds normal. No stridor.  Lymphadenopathy:    He has no cervical adenopathy.  Neurological: He is alert and oriented to person, place, and time.  Skin: Skin is warm and dry. He is not diaphoretic.  Psychiatric: He has a normal mood and affect. His behavior is normal.    BP 128/80   Pulse 98   Ht 5\' 7"  (1.702 m)   SpO2 98%   BMI 24.55 kg/m  Wt Readings from Last 3 Encounters:  03/03/18 162 lb (73.5 kg)  02/10/18 156 lb 12 oz (71.1 kg)  02/07/18 164 lb 3.9 oz (74.5 kg)   BP Readings from Last 3 Encounters:  03/03/18 129/78  03/02/18 128/80  02/19/18 (!) 126/59   Guideline developer:  UpToDate (see UpToDate for funding source) Date Released: June 2014  Health Maintenance Due  Topic Date Due  . URINE MICROALBUMIN  07/19/2015  . OPHTHALMOLOGY EXAM  11/25/2017    There are no preventive care reminders to display for this patient.  Lab Results  Component Value Date   TSH 0.51 09/27/2017   Lab Results  Component Value Date   WBC 8.8 02/11/2018   HGB 9.2 (L) 02/11/2018   HCT 28.0 (L) 02/11/2018   MCV 94.6 02/11/2018   PLT 261 02/11/2018   Lab Results  Component Value Date   NA 137 02/17/2018   K 3.3 (L) 02/17/2018   CO2 27 02/17/2018   GLUCOSE 96 02/17/2018   BUN 11 02/17/2018   CREATININE 1.67 (H) 02/17/2018    BILITOT 0.5 02/11/2018   ALKPHOS 41 02/11/2018   AST 39 02/11/2018   ALT 19 02/11/2018   PROT 4.7 (L) 02/11/2018   ALBUMIN 2.6 (L) 02/11/2018   CALCIUM 8.2 (L) 02/17/2018   ANIONGAP 8 02/17/2018   GFR 51.48 (L) 10/06/2017   Lab Results  Component Value Date   CHOL 134 07/20/2017   Lab Results  Component Value Date   HDL 33 (L) 07/20/2017   Lab Results  Component Value Date   LDLCALC 68 07/20/2017   Lab Results  Component Value Date   TRIG 166 (H) 07/20/2017   Lab Results  Component Value Date   CHOLHDL 4.1 07/20/2017   Lab Results  Component Value Date   HGBA1C 5.6 03/02/2018      Assessment & Plan:   Problem List Items Addressed This Visit      Endocrine   Diabetes mellitus type 2, controlled ([place])   Relevant Orders   Hemoglobin A1c (Completed)     Other   Unilateral AKA, 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 pantoprazole (PROTONIX) 40 MG tablet    Sig: Take 1 tablet (40 mg total) by mouth daily.    Dispense:  30 tablet    Refill:  0  . traZODone (DESYREL) 50 MG tablet    Sig: Take 0.5-1 tablets (25-50 mg total) by mouth at bedtime.    Dispense:  30 tablet    Refill:  2  . DISCONTD: gabapentin (NEURONTIN) 300 MG capsule    Sig: Take 1 capsule (300 mg total) by mouth at bedtime.    Dispense:  30 capsule    Refill:  3  . gabapentin (NEURONTIN) 100 MG capsule    Sig: Take one to two at night as needed for "right ankle" pain.    Dispense:  60 capsule    Refill:  1    Follow-up: Return in about 3 months (around 06/01/2018).   Patient was given information on phantom pain.   We will try Neurontin in the evening.  Suggested that this may be able to replace his cocci codon once the amputation wound is healed.  And trazodone as needed.  Follow-up in 3 months.

## 2018-03-02 NOTE — Patient Instructions (Signed)
Phantom Limb Pain Phantom limb pain is pain in a body part that no longer exists. It usually happens in an arm or leg (extremity) after it has been surgically removed (amputated). Most cases of phantom limb pain are brief. However, it can last for years, and it may be severe and disabling. The exact mechanism of how phantom limb pain occurs is not known. The problem may start in a part of the brain that processes feelings and awareness (sensations) from the rest of the body (sensory cortex). When a body part is lost, the sensory cortex may not be able to handle the loss and may reorganize (rewire) itself to make up for the lost signals. What are the causes? This condition only happens in patients with amputations, but the cause is not known. It may be caused by:  Damaged nerve endings (peripheral nerves).  Scar tissue.  Rewiring of nerves in the brain or spine (central nerves). What are the signs or symptoms? Symptoms vary and are related to the lost limb or the remaining stump. Stump pain may be mistaken for phantom limb pain, or you may have both at the same time. Symptoms include:  Phantom pain. Pain often feels like the pain you had before the amputation. It usually comes and goes, and it gets better over time. The pain may feel like: ? Burning. ? Stabbing. ? Throbbing. ? Cramping. ? Prickling. ? Crushing. ? It is moving over time from the farthest part of the amputated limb (fingers or toes) up to the site of amputation, as if the limb is shrinking (telescoping).  Phantom sensation. This is a feeling other than pain, as if the limb is still part of the body. This may include sensations of: ? Movement. ? Shape or length. ? Position. Physical or emotional factors can trigger or worsen pain sensations. Those factors may include:  Weather changes.  Stress.  Strong emotions.  Certain positions or movements of the body.  Pressure on the affected area. How is this  diagnosed? Diagnosis is based on your history of amputation and symptoms that you have after surgery. Your health care provider may:  Do a physical exam.  Talk with you about your symptoms and past history of pain. You may have imaging tests to examine your stump, such as X-rays or CT scan. How is this treated? There are different therapies and medicines that may give you relief. Work with your health care provider until you have adequate relief. Treatment options may include:  Pain medicine. Medicine can be given for pain right after surgery (acute pain) and for pain that goes on for some time (chronic pain). Commonly used medicines include: ? Antidepressant medicine. ? Anticonvulsant medicine. ? Narcotics, analgesics, or anti-inflammatory medicine. ? Nerve blocks.  Techniques that help to retrain the brain and nervous system (movement representation techniques), such as: ? Looking at your unaffected limb in a mirror and thinking about painless movement of your extremity (mirror therapy). ? Thinking about moving your limbs without actual movement (motor therapy). ? Watching and sensing the movement of other people (action observation).  Relaxation techniques that use the mind and body to control pain. These often involve guided imagery, deep breathing, and muscle relaxation exercises.  Hypnosis and mental imagery. These techniques can help you focus or concentrate to regain control.  Biofeedback. This involves using monitors that alert you to changes in your breathing, heart rate, skin temperature, or muscle activity, and using relaxation techniques to reverse those changes. Biofeedback tells you   mental imagery. These techniques can help you focus or concentrate to regain control.   Biofeedback. This involves using monitors that alert you to changes in your breathing, heart rate, skin temperature, or muscle activity, and using relaxation techniques to reverse those changes. Biofeedback tells you if the techniques you are using are effective.   Acupuncture. This involves inserting small needles into certain places on your skin to help relieve pain.   Sensory discrimination training. For this treatment, painless stimulation is applied to different parts of your stump and you describe what  you feel. This may help with nerve rewiring.   Physical therapy involving the stump, which may include:  ? Exercise. This may be physical movement, or it may involve applying sound waves (ultrasound) or tapping (percussion therapy) to the stump. These exercises may help to heal and retrain tissue and nerves.  ? Massage. Stump massage creates new sensations, breaks up scar tissue, and prepares the stump (desensitization) for an artificial limb (prosthesis).  ? Heat or cold treatment. This can improve blood flow and reduce inflammation.  ? Applying painless electrical pulses to the skin to prevent sensations of pain from reaching the brain (transcutaneous electrical nerve stimulation, TENS).  Follow these instructions at home:     Take over-the-counter and prescription medicines only as told by your health care provider.   Do not drive or use heavy machinery while taking prescription pain medicine.   Do not use any products that contain nicotine or tobacco, such as cigarettes and e-cigarettes. If you need help quitting, ask your health care provider.   Join a support group. Express your feelings and talk with someone you trust.   Seek counseling or talk therapy with a mental health professional. This may be helpful if you are having trouble managing your emotions about losing an extremity.   Keep all follow-up visits as told by your health care providers and therapists. This is important.  Contact a health care provider if:   You have a sore on your stump that does not get better with treatment.   Your pain does not improve with medicine or treatment.  Get help right away if:   You have suicidal thoughts.  If you ever feel like you may hurt yourself or others, or have thoughts about taking your own life, get help right away. You can go to your nearest emergency department or call:   Your local emergency services (911 in the [place]   A suicide crisis helpline, such as the National Suicide Prevention  Lifeline at 1-800-273-8255. This is open 24 hours a day.  Summary   Phantom limb pain is pain in a body part that no longer exists. It happens in an arm or leg (extremity) after it has been surgically removed (amputated).   Medicines or techniques that help to retrain the brain and nervous system (movement representation techniques) may help to relieve symptoms.   Physical therapy for phantom limb pain may involve exercise, massage, heat or cold therapy, or painless stimulation of the skin (transcutaneous electrical nerve stimulation, TENS).  This information is not intended to replace advice given to you by your health care provider. Make sure you discuss any questions you have with your health care provider.  Document Released: 04/18/2002 Document Revised: 03/06/2016 Document Reviewed: 03/06/2016  Elsevier Interactive Patient Education  2019 Elsevier Inc.

## 2018-03-02 NOTE — Progress Notes (Signed)
POST OPERATIVE OFFICE NOTE    CC:  F/u for surgery  HPI:  This is a 78 y.o. 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  He is here today with his wife for her appointment and wanted me to check his staples in his stump bc the [facility] felt like it was red and might be infected (lateral aspect).   He denies any fevers, drainage from the stump.    He is emotional today talking about his amputation.  His wife states he is doing well with PT.  He says that he needs an rx for [employer] for a shrinker sock b/c the one he is wearing is not the appropriate size.   Allergies  Allergen Reactions  . Other Other (See Comments)    Regarding scans, the PATIENT IS CLAUSTROPHOBIC!!!  . Phenergan [Promethazine Hcl] Swelling and Other (See Comments)    Cannot be combined with Zofran because swelling of the eyes resulted and patient became very restless-  . Zofran [Ondansetron Hcl] Swelling and Other (See Comments)    Cannot be combined with Phenergan because swelling of the eyes resulted and patient became very restless-  . Sulfamethoxazole Rash and Itching    Current Outpatient Medications  Medication Sig Dispense Refill  . acetaminophen (TYLENOL) 325 MG tablet Take 1-2 tablets (325-650 mg total) by mouth every 4 (four) hours as needed for mild pain.    . brimonidine-timolol (COMBIGAN) 0.2-0.5 % ophthalmic solution Place 1 drop into both eyes 2 (two) times daily.     . cholecalciferol (VITAMIN D) 1000 units tablet Take 1,000 Units by mouth daily.     Marland Kitchen ELIQUIS 5 MG TABS tablet TAKE 1 TABLET (5 MG TOTAL) BY MOUTH 2 (TWO) TIMES DAILY. 60 tablet 3  . furosemide (LASIX) 40 MG tablet Take 0.5 tablets (20 mg total) by mouth daily. 45 tablet 3  . gabapentin (NEURONTIN) 300 MG capsule Take 1 capsule (300 mg total) by mouth at bedtime. 30 capsule 3  . imatinib (GLEEVEC) 100 MG tablet Take 300 mg by mouth daily with lunch. Take with noon meal and a large glass of water.Caution:Chemotherapy    . isosorbide mononitrate  (IMDUR) 30 MG 24 hr tablet Take 1 tablet (30 mg total) by mouth daily. 90 tablet 3  . lidocaine (LIDODERM) 5 % Place 1 patch onto the skin daily as needed (for back pain). Remove & Discard patch within 12 hours or as directed by MD     . Multiple Vitamin (MULTIVITAMIN) tablet Take 1 tablet by mouth daily.    Marland Kitchen oxyCODONE-acetaminophen (PERCOCET/ROXICET) 5-325 MG tablet Take 1-2 tablets by mouth at bedtime as needed for severe pain. 7 tablet 0  . pantoprazole (PROTONIX) 40 MG tablet Take 1 tablet (40 mg total) by mouth daily. 30 tablet 0  . potassium chloride (K-DUR) 10 MEQ tablet Take 2 tablets (20 mEq total) by mouth daily. 60 tablet 0  . predniSONE (DELTASONE) 1 MG tablet TAKE 3 TABLETS BY MOUTH ONCE DAILY WITH BREAKFAST (Patient taking differently: Take 3 mg by mouth daily with breakfast. ) 270 tablet 0  . pyridostigmine (MESTINON) 60 MG tablet Take 60 mg by mouth daily.    . rosuvastatin (CRESTOR) 10 MG tablet Take 5 mg by mouth daily.    . traZODone (DESYREL) 50 MG tablet Take 0.5-1 tablets (25-50 mg total) by mouth at bedtime. 30 tablet 2   No current facility-administered medications for this visit.      ROS:  See HPI  Physical Exam:  Incision:  Incision looks good with some mild redness around the staples that appears to be reactive to the staples and not infected. There is no drainage.    Assessment/Plan:  This is a 78 y.o. male who is s/p: Right AKA 02/07/18  -pt here today with wife for her appt but wanted me to look at his stump as HHRN concerned.  He does have some redness around the staples but does not appear to be infected.  He has not had any fevers or any drainage from the stump.  Advised him to keep an eye on his incision and if the redness worsens or he develops drainage or fevers to contact us.  He has an appointment with Dr. Scot Dock on 03/16/2018.   -he says he has a couple of Percocet left but wanted a few more pain pills.  He was given Percocet 5/325 qhs prn #7 (seven)  No refill.  Discussed with him that he should not need any more than that.  -rx given for shrinker sock and he was going to [employer] when leaving the office. -very emotional about his amputation.  Provided emotional support and encouragement.  He is doing quite well but is ready for the process to be further along.  He is only 2 weeks post op and doing great with PT, which I reiterated with him.  His wife was very supportive.    Leontine Locket, PA-C [facility] [phone]  Clinic MD:  Scot Dock

## 2018-03-03 ENCOUNTER — Encounter: Payer: Self-pay | Admitting: Registered Nurse

## 2018-03-03 ENCOUNTER — Encounter: Payer: Medicare Other | Attending: Registered Nurse | Admitting: Registered Nurse

## 2018-03-03 ENCOUNTER — Ambulatory Visit: Payer: Medicare Other | Admitting: Family Medicine

## 2018-03-03 VITALS — BP 129/78 | HR 88 | Ht 67.0 in | Wt 162.0 lb

## 2018-03-03 DIAGNOSIS — S78111A Complete traumatic amputation at level between right hip and knee, initial encounter: Secondary | ICD-10-CM | POA: Diagnosis not present

## 2018-03-03 DIAGNOSIS — Z8673 Personal history of transient ischemic attack (TIA), and cerebral infarction without residual deficits: Secondary | ICD-10-CM

## 2018-03-03 DIAGNOSIS — N183 Chronic kidney disease, stage 3 unspecified: Secondary | ICD-10-CM

## 2018-03-03 DIAGNOSIS — I2510 Atherosclerotic heart disease of native coronary artery without angina pectoris: Secondary | ICD-10-CM | POA: Diagnosis not present

## 2018-03-03 NOTE — Patient Instructions (Signed)
Call Zella Ball the week of January of January 27th or 28th, to evaluate medication .

## 2018-03-03 NOTE — Progress Notes (Signed)
Subjective:    Patient ID: [name], male    DOB: Jun 13, 1940, 78 y.o.   MRN: [id number]  HPI: [name] is a 78 y.o. male who is here for Transitional Care Visit . He presented to [facility] Emergency Department on 02/05/2018 for right leg pain and worsening symptoms in the right foot. He underwent Right AKA on 02/07/2018 by Dr. Doren Custard. [name] was admitted to [facility] on 02/10/2018.  Today he is here for Transitional Care Visit in follow up of his Right AKA, History of CVA, CAD and Stage 3 CKD. He was discharged home receiving [place] from [place]. He denies pain. He rated his pain on flowsheet last night 2. Also reports he has a good appetite.   [name] reports he stopped his Plaxix last week when he was having uncontrollable bleeding from a laceration, he did not consult his cardiologist. He was instructed to call his cardiologist today. He continues with Eliquis he states, He verbalizes understanding.   Arrived in wheelchair, wife in room. All questions answered.    Pain Inventory Average Pain 2 Pain Right Now 2 My pain is stabbing and tingling  In the last 24 hours, has pain interfered with the following? General activity 1 Relation with others 1 Enjoyment of life 1 What TIME of day is your pain at its worst? daytime Sleep (in general) Fair  Pain is worse with: some activites Pain improves with: rest Relief from Meds: na  Mobility ability to climb steps?  no do you drive?  no use a wheelchair transfers alone  Function not employed: date last employed na disabled: date disabled na  Neuro/Psych weakness tingling  Prior Studies Any changes since last visit?  no  Physicians involved in your care Any changes since last visit?  no   Family History  Problem Relation Age of Onset  . Heart disease Mother   . Heart attack Mother   . Stroke Mother   . Heart disease Father   . Heart attack Father   . Stroke  Father   . Breast cancer Sister        Twin   . Heart attack Sister   . Dementia Sister   . Heart disease Sister   . Heart attack Sister   . Clotting disorder Sister   . Heart attack Sister   . Colon cancer Neg Hx    Social History   Socioeconomic History  . Marital status: Married    Spouse name: [name]  . Number of children: 2  . Years of education: 1-College  . Highest education level: Not on file  Occupational History  . 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 Financial resource strain: Not on file  . Food insecurity:    Worry: Not on file    Inability: Not on file  . Transportation needs:    Medical: Not on file    Non-medical: Not on file  Tobacco Use  . Smoking status: Never Smoker  . Smokeless tobacco: Never Used  Substance and Sexual Activity  . Alcohol use: No    Alcohol/week: 0.0 standard drinks  . Drug use: No  . Sexual activity: Not Currently  Lifestyle  . Physical activity:    Days per week: Not on file    Minutes per session: Not on file  . Stress: Not on file  Relationships  . Social connections:  Talks on phone: Patient refused    Gets together: Patient refused    Attends religious service: Patient refused    Active member of club or organization: Patient refused    Attends meetings of clubs or organizations: Patient refused    Relationship status: Patient refused  Other Topics Concern  . Not on file  Social History Narrative   Lives at home w/ his wife   Patient is right handed.   Patient has 1-2 cups of caffeine daily.   Past Surgical History:  Procedure Laterality Date  . ABDOMINAL AORTOGRAM W/LOWER EXTREMITY N/A 11/18/2016   Procedure: ABDOMINAL AORTOGRAM W/LOWER EXTREMITY;  Surgeon: [name], MD;  Location: [place] [facility];  Service: Cardiovascular;  Laterality: N/A;  . AMPUTATION Right 02/07/2018   Procedure: right leg AMPUTATION ABOVE KNEE;  Surgeon: [name], MD;   Location: [facility];  Service: Vascular;  Laterality: Right;  . AORTOGRAM Right 12/23/2017   Procedure: AORTOGRAM, RIGHT LOWER EXTREMITY ANGIOGRAM, RIGHT SUPERFICIAL FEMORAL ARTERY ANGIOPLASTY WITH STENT, RIGHT POPLITEAL ARTERY ANGIOPLASTY WITH STENT, LEFT GROIN CUTDOWN AND LEFT FEMORAL ARTERY REPAIR;  Surgeon: [name], MD;  Location: [facility];  Service: Vascular;  Laterality: Right;  . BACK SURGERY     x2, lumbar and cervical  . CARDIAC CATHETERIZATION    . CATARACT EXTRACTION Bilateral 12/12  . COLONOSCOPY    . ESOPHAGOGASTRODUODENOSCOPY ENDOSCOPY  04/2015  . IR GENERIC HISTORICAL  03/03/2016   IR IVC FILTER PLMT / S&I [name] GUID/MOD SED 03/03/2016 [name], MD [facility]  . IR GENERIC HISTORICAL  04/09/2016   IR RADIOLOGIST EVAL & MGMT 04/09/2016 [name], MD [facility] INTER[facility] RAD  . IR GENERIC HISTORICAL  04/30/2016   IR IVC FILTER RETRIEVAL / S&I [name] GUID/MOD SED 04/30/2016 [name], MD [facility]  . LITHOTRIPSY    . LUMBAR LAMINECTOMY/DECOMPRESSION MICRODISCECTOMY Right 09/14/2012   Procedure: Right Lumbar three-four, four-five, Lumbar five-Sacral one decompressive laminectomy;  Surgeon: [name], MD;  Location: [place] [facility];  Service: Neurosurgery;  Laterality: Right;  . LUMBAR LAMINECTOMY/DECOMPRESSION MICRODISCECTOMY Left 03/11/2016   Procedure: Left Lumbar Two-ThreeMicrodiscectomy;  Surgeon: [name], MD;  Location: [facility];  Service: Neurosurgery;  Laterality: Left;  . PERIPHERAL VASCULAR INTERVENTION  11/18/2016   Procedure: PERIPHERAL VASCULAR INTERVENTION;  Surgeon: [name], MD;  Location: [facility];  Service: Cardiovascular;;  Left popliteal  . SHOULDER SURGERY Left 05/07/2009  . TONSILLECTOMY     Past Medical History:  Diagnosis Date  . Bruises easily    d/t being on Eliquis  . Carotid artery disease ([place])    a. Duplex 05/2014: 35-00% RICA, 9-38% LICA, elevated velocities in right subclavian artery, normal left subclavian artery..  .  Chronic back pain    HNP  . Chronic kidney disease (CKD)   . Claustrophobia    takes Ativan as needed  . 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 Coronary atherosclerosis of unspecified type of vessel, native or graft    a. cath in 2003 showed 10-20% LM, scattered 20% prox-mid LAD, 30% more distal LAD, 50-60% stenosis of LAD towards apex, 20% Cx, 30% dRCA, focal 95% stenosis in smaller of 2 branches of PDA, EF 60-65%.  . Diabetes mellitus ([place])   . Diarrhea    takes Imodium daily as needed  . Diverticulosis   . Diverticulosis of colon (without mention of hemorrhage)   . DJD (degenerative joint disease)   .  DVT (deep venous thrombosis) ([place]) 07/2011  . Esophageal stricture   . Essential hypertension    takes Imdur and Lisinopril daily  . Gastric polyps    benign  . GERD (gastroesophageal reflux disease)    takes Nexium daily  . Glaucoma    uses eye drops  . History of bronchitis    not sure when the last time  . History of colon polyps    benign  . History of kidney stones   . History of kidney stones   . Insomnia    takes Melatonin nightly as needed  . Mixed hyperlipidemia    takes Crestor daily  . Myasthenia gravis ([place])    uses prednisone  . Osteoporosis 2016  . Peripheral edema    takes Lasix daily  . Pituitary tumor    checks Dr[facility]Walden at [facility] checks it every Jan   . Pneumonia 01/2016  . PONV (postoperative nausea and vomiting)   . Ptosis   . Pulmonary embolus ([place]) 2012  . Stroke ([place]) 06/2015   BP 129/78   Pulse 88   Ht 5\' 7"  (1.702 m)   Wt 162 lb (73.5 kg) Comment: pt reported, in wheelchair  SpO2 97%   BMI 25.37 kg/m   Opioid Risk Score:   Fall Risk Score:  `1  Depression screen PHQ 2/9  Depression screen [facility] 2/9 12/16/2017 09/16/2017 07/12/2015  Decreased Interest 0 0 0  Down, Depressed, Hopeless 0 0 0  PHQ - 2 Score 0 0 0  Altered sleeping - - 1  Tired, decreased energy - - 1  Change in appetite - - 0  Feeling bad or  failure about yourself  - - 0  Trouble concentrating - - 0  Moving slowly or fidgety/restless - - 0  Suicidal thoughts - - 0  PHQ-9 Score - - 2  Difficult doing work/chores - - Not difficult at all  Some recent data might be hidden    Review of Systems  Constitutional: Negative.   HENT: Negative.   Eyes: Negative.   Respiratory: Negative.   Cardiovascular: Negative.   Gastrointestinal: Negative.   Endocrine: Negative.   Genitourinary: Negative.   Musculoskeletal: Negative.   Skin: Negative.   Allergic/Immunologic: Negative.   Neurological: Negative.   Hematological: Negative.   Psychiatric/Behavioral: Negative.   All other systems reviewed and are negative.      Objective:   Physical Exam Vitals signs and nursing note reviewed.  Constitutional:      Appearance: Normal appearance.  Neck:     Musculoskeletal: Normal range of motion and neck supple.  Cardiovascular:     Rate and Rhythm: Normal rate and regular rhythm.     Pulses: Normal pulses.     Heart sounds: Normal heart sounds.  Pulmonary:     Effort: Pulmonary effort is normal.     Breath sounds: Normal breath sounds.  Skin:    General: Skin is warm and dry.     Comments: Right Stump edge reddened staples OTA no drainage noted.  Neurological:     Mental Status: He is alert and oriented to person, place, and time.  Psychiatric:        Mood and Affect: Mood normal.        Behavior: Behavior normal.           Assessment & Plan:  1. Unilateral Right AKA: Dr. Doren Custard Following. Continue Home Health Therapy with [facility].  2. History of CVA: Continue HEP as Tolerated. Continue  to Monitor.  3. CAD: Cardiology Following.  4. Stage 3 CKD: PCP Following.   30  minutes of face to face patient care time was spent during this visit. All questions were encouraged and answered.  F/U in 4- 6 weeks with Dr Naaman Plummer

## 2018-03-04 ENCOUNTER — Telehealth: Payer: Self-pay | Admitting: Family Medicine

## 2018-03-04 DIAGNOSIS — E1142 Type 2 diabetes mellitus with diabetic polyneuropathy: Secondary | ICD-10-CM | POA: Diagnosis not present

## 2018-03-04 DIAGNOSIS — S51811D Laceration without foreign body of right forearm, subsequent encounter: Secondary | ICD-10-CM | POA: Diagnosis not present

## 2018-03-04 DIAGNOSIS — E1122 Type 2 diabetes mellitus with diabetic chronic kidney disease: Secondary | ICD-10-CM | POA: Diagnosis not present

## 2018-03-04 DIAGNOSIS — I129 Hypertensive chronic kidney disease with stage 1 through stage 4 chronic kidney disease, or unspecified chronic kidney disease: Secondary | ICD-10-CM | POA: Diagnosis not present

## 2018-03-04 DIAGNOSIS — Z4781 Encounter for orthopedic aftercare following surgical amputation: Secondary | ICD-10-CM | POA: Diagnosis not present

## 2018-03-04 DIAGNOSIS — E1151 Type 2 diabetes mellitus with diabetic peripheral angiopathy without gangrene: Secondary | ICD-10-CM | POA: Diagnosis not present

## 2018-03-04 MED ORDER — GABAPENTIN 100 MG PO CAPS: ORAL_CAPSULE | ORAL | 1 refills | Status: DC

## 2018-03-04 NOTE — Telephone Encounter (Signed)
Please advise on which dosage you would recommend, thanks!

## 2018-03-04 NOTE — Telephone Encounter (Signed)
I called and spoke with patient's wife. I made her aware that the new Rx has been sent in and of the directions below. She verbalized understanding.

## 2018-03-04 NOTE — Telephone Encounter (Signed)
Called pt and left message to call back and discuss symptoms. Pt stating the dose he is currently on is too strong.

## 2018-03-04 NOTE — Telephone Encounter (Signed)
[place] called back and stated that 300 mg is making the pt  -"feel groggy or sleepy all the time - 100 mg - 200 mg whichever the doctor feels is appropriate."

## 2018-03-04 NOTE — Telephone Encounter (Signed)
Copied from [place] [id number]. Topic: General - Other >> Mar 04, 2018 10:05 AM Yvette Rack wrote: Reason for CRM: Pt wife Hassan Rowan called in to inform that pt states that the gabapentin (NEURONTIN) 300 MG capsule is too strong and pt would like the dosage lowered to 100 MG capsule. Pt request that a prescription for gabapentin (NEURONTIN) 100 MG capsule be sent to [place], [name] - [place] [phone] (Phone)  [phone] (Fax)

## 2018-03-04 NOTE — Addendum Note (Signed)
Addended by: Jon Billings on: 03/04/2018 12:03 PM   Modules accepted: Orders

## 2018-03-04 NOTE — Telephone Encounter (Signed)
Sent in a new rx for the 100mg  tabs. He may take one to two at night as needed.

## 2018-03-07 DIAGNOSIS — E1142 Type 2 diabetes mellitus with diabetic polyneuropathy: Secondary | ICD-10-CM | POA: Diagnosis not present

## 2018-03-07 DIAGNOSIS — E1122 Type 2 diabetes mellitus with diabetic chronic kidney disease: Secondary | ICD-10-CM | POA: Diagnosis not present

## 2018-03-07 DIAGNOSIS — S51811D Laceration without foreign body of right forearm, subsequent encounter: Secondary | ICD-10-CM | POA: Diagnosis not present

## 2018-03-07 DIAGNOSIS — I129 Hypertensive chronic kidney disease with stage 1 through stage 4 chronic kidney disease, or unspecified chronic kidney disease: Secondary | ICD-10-CM | POA: Diagnosis not present

## 2018-03-07 DIAGNOSIS — Z4781 Encounter for orthopedic aftercare following surgical amputation: Secondary | ICD-10-CM | POA: Diagnosis not present

## 2018-03-07 DIAGNOSIS — E1151 Type 2 diabetes mellitus with diabetic peripheral angiopathy without gangrene: Secondary | ICD-10-CM | POA: Diagnosis not present

## 2018-03-08 ENCOUNTER — Ambulatory Visit: Payer: Medicare Other | Admitting: Cardiovascular Disease

## 2018-03-09 ENCOUNTER — Encounter: Payer: Self-pay | Admitting: Vascular Surgery

## 2018-03-09 ENCOUNTER — Other Ambulatory Visit: Payer: Self-pay

## 2018-03-09 ENCOUNTER — Ambulatory Visit (INDEPENDENT_AMBULATORY_CARE_PROVIDER_SITE_OTHER): Payer: Self-pay | Admitting: Vascular Surgery

## 2018-03-09 VITALS — BP 97/49 | HR 76 | Temp 97.8°F | Resp 20 | Ht 67.0 in | Wt 162.0 lb

## 2018-03-09 DIAGNOSIS — I739 Peripheral vascular disease, unspecified: Secondary | ICD-10-CM

## 2018-03-09 DIAGNOSIS — Z48812 Encounter for surgical aftercare following surgery on the circulatory system: Secondary | ICD-10-CM

## 2018-03-09 NOTE — Progress Notes (Signed)
Patient name: [name] MRN: [id number] DOB: 01-Mar-1940 Sex: male  REASON FOR VISIT:   Follow-up after right above-the-knee amputation.  HPI:   [name] is a pleasant 78 y.o. 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  The patient was last seen by the physician's assistant on 03/02/2018.  At that time the incision appeared to be healing adequately with some staple reaction but no evidence of infection.  Comes in for his routine follow-up visit.  Patient has no specific complaints.  He denies fever or chills.  He is on Eliquis.  Current Outpatient Medications  Medication Sig Dispense Refill  . acetaminophen (TYLENOL) 325 MG tablet Take 1-2 tablets (325-650 mg total) by mouth every 4 (four) hours as needed for mild pain.    . brimonidine-timolol (COMBIGAN) 0.2-0.5 % ophthalmic solution Place 1 drop into both eyes 2 (two) times daily.     . cholecalciferol (VITAMIN D) 1000 units tablet Take 1,000 Units by mouth daily.     Marland Kitchen ELIQUIS 5 MG TABS tablet TAKE 1 TABLET (5 MG TOTAL) BY MOUTH 2 (TWO) TIMES DAILY. 60 tablet 3  . furosemide (LASIX) 40 MG tablet Take 0.5 tablets (20 mg total) by mouth daily. 45 tablet 3  . gabapentin (NEURONTIN) 100 MG capsule Take one to two at night as needed for "right ankle" pain. 60 capsule 1  . imatinib (GLEEVEC) 100 MG tablet Take 300 mg by mouth daily with lunch. Take with noon meal and a large glass of water.Caution:Chemotherapy    . isosorbide mononitrate (IMDUR) 30 MG 24 hr tablet Take 1 tablet (30 mg total) by mouth daily. 90 tablet 3  . lidocaine (LIDODERM) 5 % Place 1 patch onto the skin daily as needed (for back pain). Remove & Discard patch within 12 hours or as directed by MD     . Multiple Vitamin (MULTIVITAMIN) tablet Take 1 tablet by mouth daily.    Marland Kitchen oxyCODONE-acetaminophen (PERCOCET/ROXICET) 5-325 MG tablet Take 1-2 tablets by mouth at bedtime as needed for severe pain. 7 tablet 0  . pantoprazole (PROTONIX) 40 MG  tablet Take 1 tablet (40 mg total) by mouth daily. 30 tablet 0  . potassium chloride (K-DUR) 10 MEQ tablet Take 2 tablets (20 mEq total) by mouth daily. 60 tablet 0  . predniSONE (DELTASONE) 1 MG tablet TAKE 3 TABLETS BY MOUTH ONCE DAILY WITH BREAKFAST (Patient taking differently: Take 3 mg by mouth daily with breakfast. ) 270 tablet 0  . pyridostigmine (MESTINON) 60 MG tablet Take 60 mg by mouth daily.    . rosuvastatin (CRESTOR) 10 MG tablet Take 5 mg by mouth daily.    . traZODone (DESYREL) 50 MG tablet Take 0.5-1 tablets (25-50 mg total) by mouth at bedtime. 30 tablet 2   No current facility-administered medications for this visit.    REVIEW OF SYSTEMS:  [X]  denotes positive finding, [ ]  denotes negative finding Vascular    Leg swelling    Cardiac    Chest pain or chest pressure:    Shortness of breath upon exertion:    Short of breath when lying flat:    Irregular heart rhythm:    Constitutional    Fever or chills:     PHYSICAL EXAM:   Vitals:   03/09/18 1303  BP: (!) 97/49  Pulse: 76  Resp: 20  Temp: 97.8 F (36.6 C)  SpO2: 99%  Weight: 162 lb (73.5 kg)  Height: 5\' 7"  (1.702 m)    GENERAL:  The patient is a well-nourished male, in no acute distress. The vital signs are documented above. CARDIOVASCULAR: There is a regular rate and rhythm. PULMONARY: There is good air exchange bilaterally without wheezing or rales. He is right above-the-knee amputation site is healing nicely.  There is no erythema or drainage.  DATA:   No new data  MEDICAL ISSUES:   STATUS POST RIGHT ABOVE-THE-KNEE AMPUTATION: His right above-the-knee amputation site is healing nicely.  We will remove his staples in the office today.  I will see him back in 6 months with an ABI of the left lower extremity so we can keep an eye on his peripheral vascular disease on the left.  He knows to call sooner if he has problems.  [name] [facility]  [phone]

## 2018-03-11 DIAGNOSIS — Z4781 Encounter for orthopedic aftercare following surgical amputation: Secondary | ICD-10-CM | POA: Diagnosis not present

## 2018-03-11 DIAGNOSIS — S51811D Laceration without foreign body of right forearm, subsequent encounter: Secondary | ICD-10-CM | POA: Diagnosis not present

## 2018-03-11 DIAGNOSIS — E1122 Type 2 diabetes mellitus with diabetic chronic kidney disease: Secondary | ICD-10-CM | POA: Diagnosis not present

## 2018-03-11 DIAGNOSIS — I129 Hypertensive chronic kidney disease with stage 1 through stage 4 chronic kidney disease, or unspecified chronic kidney disease: Secondary | ICD-10-CM | POA: Diagnosis not present

## 2018-03-11 DIAGNOSIS — E1151 Type 2 diabetes mellitus with diabetic peripheral angiopathy without gangrene: Secondary | ICD-10-CM | POA: Diagnosis not present

## 2018-03-11 DIAGNOSIS — E1142 Type 2 diabetes mellitus with diabetic polyneuropathy: Secondary | ICD-10-CM | POA: Diagnosis not present

## 2018-03-14 DIAGNOSIS — Z4781 Encounter for orthopedic aftercare following surgical amputation: Secondary | ICD-10-CM | POA: Diagnosis not present

## 2018-03-14 DIAGNOSIS — E1151 Type 2 diabetes mellitus with diabetic peripheral angiopathy without gangrene: Secondary | ICD-10-CM | POA: Diagnosis not present

## 2018-03-14 DIAGNOSIS — E1142 Type 2 diabetes mellitus with diabetic polyneuropathy: Secondary | ICD-10-CM | POA: Diagnosis not present

## 2018-03-14 DIAGNOSIS — S51811D Laceration without foreign body of right forearm, subsequent encounter: Secondary | ICD-10-CM | POA: Diagnosis not present

## 2018-03-14 DIAGNOSIS — I129 Hypertensive chronic kidney disease with stage 1 through stage 4 chronic kidney disease, or unspecified chronic kidney disease: Secondary | ICD-10-CM | POA: Diagnosis not present

## 2018-03-14 DIAGNOSIS — E1122 Type 2 diabetes mellitus with diabetic chronic kidney disease: Secondary | ICD-10-CM | POA: Diagnosis not present

## 2018-03-15 ENCOUNTER — Encounter: Payer: Self-pay | Admitting: Gastroenterology

## 2018-03-15 NOTE — Progress Notes (Signed)
Cardiology Office Note:    Date:  03/16/2018   ID:  [name], DOB Aug 17, 1940, MRN [id number]  PCP:  Tristan Maw, MD  Cardiologist:  Tristan Mocha, MD   Electrophysiologist:  None  PV:  Tristan Sacramento, MD Vascular Surgeon:  Tristan Mayo, MD  Referring MD: [name],*   Chief Complaint  Patient presents with  . Follow-up    CAD, CHF     History of Present Illness:    [name] is a 78 y.o. 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  I last saw him in 12/2017 for ongoing leg swelling.  He was then admitted in 12/2017 with critical limb ischemia.  He underwent R SFA stenting and R popliteal artery stenting with Dr. Monica [name].  He eventually required R AKA in 01/2018.        [name] returns for follow up.  He is here with his wife.  He has not had any chest pain, shortness of breath, syncope, paroxysmal nocturnal dyspnea.  His L leg remains swollen. There has been no worsening.  He sees a prosthetist soon for his R leg.  He has not had any bleeding issues.  He has significant issues with sleeping.    Prior CV studies:   The following studies were reviewed today:  Arterial Dopplers/ABIs 12/23/17 Summary: Right: Resting right ankle-brachial index indicates noncompressible right lower extremity arteries. Waveform analysis is consistent with severe peripheral arterial disease. Left: Resting left ankle-brachial index indicates  noncompressible left lower extremity arteries. ABIs are unreliable. TBIs are unreliable. Waveform analysis is consistent with moderate peripheral arterial disease. Summary: Right: Technically challenging exam due to heavy medial calcinosis and acoustic shadowing from calcified plaque. Suspected occlusion of the distal right superficial femoral artery/popliteal artery with minimally reconstituted flow noted in the mid right  popliteal. Unable to rule out all areas of stenosis/occlusion. The right peroneal and posterior tibial arteries have no detectable flow consistent with occlusion. One-vessel runoff via the anterior tibial artery.  Echo 07/20/2017 Mild concentric LVH, EF 50-55, normal wall motion, grade 1 diastolic dysfunction, mild aortic stenosis (mean 7 mmHg), trivial AI, mild MR, normal RVSF  Carotid US 02/26/2017 Final Interpretation: Right Carotid: Velocities in the right ICA are consistent with a 40-59% stenosis. Left Carotid: Velocities in the left ICA are consistent with a 1-39% stenosis. Vertebrals: Right vertebral artery was patent with antegrade flow. No evidence       of flow detected in the left vertebral artery. Subclavians: Normal flow hemodynamics were seen in bilateral subclavian       arteries.  Nuclear stress test 09/27/2015 Low risk stress nuclear study with normal perfusion and normal left ventricular regional and global systolic function.  Cardiac catheterization 03/17/2001 ANGIOGRAPHIC FINDINGS: 1. The left main coronary artery has approximately 10-20% stenosis, but no significant flow-limiting lesions. 2. The left anterior descending is a medium caliber vessel that provides three diagonal branches. There is scattered 20% stenosis in the proximal to mid vessel with 30% more distal stenosis followed by an area of 50-60% stenosis towards the apex. Flow  is TIMI-3 throughout this vessel. 3. The circumflex coronary artery essentially provides  one large obtuse marginal branch then has a 20% stenosis. 4. The right coronary artery is a dominant vessel that essentially has two branches in the posterior descending distribution. There was a 30% distal RCA stenosis and a focal 95% stenosis in the smaller of the two branches in the posterior descending territory.  LEFT VENTRICULOGRAM: The left ventriculography reveals an ejection fraction estimated at 60-65% with no focal wall motion abnormalities and no mitral regurgitation.  DIAGNOSES: 1. Distal and branch vessel coronary artery disease as outlined. 2. Normal left ventricular contraction estimated at 60-65% with no significant mitral regurgitation.  Past Medical History:  Diagnosis Date  . Bruises easily    d/t being on Eliquis  . Carotid artery disease ([place])    a. Duplex 05/2014: 16-10% RICA, 9-60% LICA, elevated velocities in right subclavian artery, normal left subclavian artery..  . Chronic back pain    HNP  . Chronic kidney disease (CKD)   . Claustrophobia    takes Ativan as needed  . 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 Coronary atherosclerosis of unspecified type of vessel, native or graft    a. cath in 2003 showed 10-20% LM, scattered 20% prox-mid LAD, 30% more distal LAD, 50-60% stenosis of LAD towards apex, 20% Cx, 30% dRCA, focal 95% stenosis in smaller of 2 branches of PDA, EF 60-65%.  . Diabetes mellitus ([place])   . Diarrhea    takes Imodium daily as needed  . Diverticulosis   . Diverticulosis of colon (without mention of hemorrhage)   . DJD (degenerative joint disease)   . DVT (deep venous thrombosis) ([place]) 07/2011  . Esophageal stricture   . Essential hypertension    takes Imdur and Lisinopril daily  . Gastric polyps    benign  . GERD (gastroesophageal reflux disease)    takes Nexium daily  . Glaucoma    uses eye drops  . History of bronchitis    not sure when the last time  . History of colon polyps     benign  . History of kidney stones   . History of kidney stones   . Insomnia    takes Melatonin nightly as needed  . Mixed hyperlipidemia    takes Crestor daily  . Myasthenia gravis ([place])    uses prednisone  . Osteoporosis 2016  . Peripheral edema    takes Lasix daily  . Pituitary tumor    checks Dr[facility]Walden at [facility] checks it every Jan   . Pneumonia 01/2016  . PONV (postoperative nausea and vomiting)   . Ptosis   . Pulmonary embolus ([place]) 2012  . Stroke [facility]) 06/2015   Surgical Hx: The patient  has a past surgical history that includes Back surgery; Tonsillectomy; Cataract extraction (Bilateral, 12/12); Shoulder surgery (Left, 05/07/2009); Cardiac catheterization; Lumbar laminectomy/decompression microdiscectomy (Right, 09/14/2012); Esophagogastroduodenoscopy endoscopy (04/2015); ir generic historical (03/03/2016); Colonoscopy; Lithotripsy; Lumbar laminectomy/decompression microdiscectomy (Left, 03/11/2016); ir generic historical (04/09/2016); ir generic historical (04/30/2016); ABDOMINAL AORTOGRAM W/LOWER EXTREMITY (N/A, 11/18/2016); PERIPHERAL VASCULAR INTERVENTION (11/18/2016); Aortogram (Right, 12/23/2017); and Amputation (Right, 02/07/2018).   Current Medications: Current Meds  Medication Sig  . acetaminophen (TYLENOL) 325 MG tablet Take 1-2 tablets (325-650 mg total) by mouth every 4 (four) hours as needed for mild pain.  . brimonidine-timolol (COMBIGAN) 0.2-0.5 % ophthalmic solution Place 1 drop into both eyes 2 (two) times daily.   . cholecalciferol (VITAMIN D) 1000 units tablet Take  1,000 Units by mouth daily.   Marland Kitchen ELIQUIS 5 MG TABS tablet TAKE 1 TABLET (5 MG TOTAL) BY MOUTH 2 (TWO) TIMES DAILY.  . furosemide (LASIX) 40 MG tablet Take 0.5 tablets (20 mg total) by mouth daily.  Marland Kitchen gabapentin (NEURONTIN) 100 MG capsule Take one to two at night as needed for "right ankle" pain.  Marland Kitchen imatinib (GLEEVEC) 100 MG tablet Take 300 mg by mouth daily with lunch. Take with noon meal and a large  glass of water.Caution:Chemotherapy  . isosorbide mononitrate (IMDUR) 30 MG 24 hr tablet Take 1 tablet (30 mg total) by mouth daily.  Marland Kitchen lidocaine (LIDODERM) 5 % Place 1 patch onto the skin daily as needed (for back pain). Remove & Discard patch within 12 hours or as directed by MD   . Multiple Vitamin (MULTIVITAMIN) tablet Take 1 tablet by mouth daily.  Marland Kitchen oxyCODONE-acetaminophen (PERCOCET/ROXICET) 5-325 MG tablet Take 1-2 tablets by mouth at bedtime as needed for severe pain.  . pantoprazole (PROTONIX) 40 MG tablet Take 1 tablet (40 mg total) by mouth daily.  . potassium chloride (K-DUR) 10 MEQ tablet Take 2 tablets (20 mEq total) by mouth daily.  . predniSONE (DELTASONE) 1 MG tablet TAKE 3 TABLETS BY MOUTH ONCE DAILY WITH BREAKFAST  . pyridostigmine (MESTINON) 60 MG tablet Take 60 mg by mouth daily.  . rosuvastatin (CRESTOR) 10 MG tablet Take 5 mg by mouth daily.  . traZODone (DESYREL) 50 MG tablet Take 0.5-1 tablets (25-50 mg total) by mouth at bedtime.     Allergies:   Other; Phenergan [promethazine hcl]; Zofran [ondansetron hcl]; and Sulfamethoxazole   Social History   Tobacco Use  . Smoking status: Never Smoker  . Smokeless tobacco: Never Used  Substance Use Topics  . Alcohol use: No    Alcohol/week: 0.0 standard drinks  . Drug use: No     Family Hx: The patient's family history includes Breast cancer in his sister; Clotting disorder in his sister; Dementia in his sister; Heart attack in his father, mother, sister, sister, and sister; Heart disease in his father, mother, and sister; Stroke in his father and mother. There is no history of Colon cancer.  ROS:   Please see the history of present illness.    Review of Systems  HENT: Positive for hearing loss.    All other systems reviewed and are negative.   EKGs/Labs/Other Test Reviewed:    EKG:  EKG is  ordered today.  The ekg ordered today demonstrates sinus rhythm with sinus arrhythmia, HR 78, low voltage, septal Q waves,  QTc 451, similar to old EKGs.  Recent Labs: 09/27/2017: TSH 0.51 10/06/2017: Pro B Natriuretic peptide (BNP) 73.0 11/06/2017: Magnesium 2.0 02/11/2018: ALT 19; Hemoglobin 9.2; Platelets 261 02/17/2018: BUN 11; Creatinine, Ser 1.67; Potassium 3.3; Sodium 137   Recent Lipid Panel Lab Results  Component Value Date/Time   CHOL 134 07/20/2017 05:25 AM   TRIG 166 (H) 07/20/2017 05:25 AM   HDL 33 (L) 07/20/2017 05:25 AM   CHOLHDL 4.1 07/20/2017 05:25 AM   LDLCALC 68 07/20/2017 05:25 AM   LDLDIRECT 59.8 10/12/2012 07:37 AM   From KPN Tool     Physical Exam:    VS:  BP (!) 142/82   Pulse 78   Ht 5\' 7"  (1.702 m)   Wt 155 lb (70.3 kg)   SpO2 98%   BMI 24.28 kg/m     Wt Readings from Last 3 Encounters:  03/16/18 155 lb (70.3 kg)  03/09/18 162 lb (73.5  kg)  03/03/18 162 lb (73.5 kg)     Physical Exam  Constitutional: He is oriented to person, place, and time. He appears well-developed and well-nourished. No distress.  HENT:  Head: Normocephalic and atraumatic.  Eyes: No scleral icterus.  Neck: No thyromegaly present.  Cardiovascular: Normal rate and regular rhythm.  No murmur heard. Pulmonary/Chest: Effort normal. He has no rales.  Abdominal: Soft. He exhibits no distension.  Musculoskeletal:        General: Edema (1+ LLE edema; R AKA noted) present.  Lymphadenopathy:    He has no cervical adenopathy.  Neurological: He is alert and oriented to person, place, and time.  Skin: Skin is warm and dry.  Psychiatric: He has a normal mood and affect.    ASSESSMENT & PLAN:    Coronary artery disease involving native coronary artery of native heart without angina pectoris History of distal vessel CAD by remote cardiac catheterization.  Nuclear stress test in 2017 was low risk.  He has been managed medically.  He is not having angina.  He is not on ASA as he is on Apixaban.  Continue Isosorbide, Rosuvastatin. Obtain BMET, CBC today.  FU with Dr. Burt Knack in 4 mos.  Chronic diastolic  CHF (congestive heart failure) ([place]) Echo in 6/19 with normal EF and mild diastolic dysfunction.  He has chronic L leg swelling.  Overall, volume appears stable.  He can take extra Lasix 20 mg as needed for increased swelling.  Obtain BMET today.    PAD (peripheral artery disease) (HCC) / Unilateral AKA, right (HCC) Continue statin. FU with vascular surgery as planned.    Stage 3 chronic kidney disease ([place]) Repeat BMET today.   History of pulmonary embolism He is on chronic anticoagulation.  EKG was reviewed completely today and compared to prior EKGs.  No evidence of atrial fibrillation.  Obtain BMET, CBC today.   Essential hypertension BP above target.  Continue to monitor for now.  If needed, we could add low dose beta-blocker to get BP to target.     Dispo:  Return in about 4 months (around 07/15/2018) for Routine Follow Up, w/ Dr. Burt Knack.   Medication Adjustments/Labs and Tests Ordered: Current medicines are reviewed at length with the patient today.  Concerns regarding medicines are outlined above.  Tests Ordered: Orders Placed This Encounter  Procedures  . Basic metabolic panel  . CBC  . EKG 12-Lead   Medication Changes: No orders of the defined types were placed in this encounter.   Signed, Richardson Dopp, PA-C  03/16/2018 2:17 PM    [facility] [place], [place],   [address] Phone: [phone]; Fax: [phone]

## 2018-03-16 ENCOUNTER — Encounter: Payer: Self-pay | Admitting: Physician Assistant

## 2018-03-16 ENCOUNTER — Encounter: Payer: 59 | Admitting: Vascular Surgery

## 2018-03-16 ENCOUNTER — Ambulatory Visit (INDEPENDENT_AMBULATORY_CARE_PROVIDER_SITE_OTHER): Payer: Medicare Other | Admitting: Physician Assistant

## 2018-03-16 VITALS — BP 142/82 | HR 78 | Ht 67.0 in | Wt 155.0 lb

## 2018-03-16 DIAGNOSIS — Z4781 Encounter for orthopedic aftercare following surgical amputation: Secondary | ICD-10-CM | POA: Diagnosis not present

## 2018-03-16 DIAGNOSIS — I2510 Atherosclerotic heart disease of native coronary artery without angina pectoris: Secondary | ICD-10-CM

## 2018-03-16 DIAGNOSIS — S78111A Complete traumatic amputation at level between right hip and knee, initial encounter: Secondary | ICD-10-CM

## 2018-03-16 DIAGNOSIS — Z86711 Personal history of pulmonary embolism: Secondary | ICD-10-CM

## 2018-03-16 DIAGNOSIS — I129 Hypertensive chronic kidney disease with stage 1 through stage 4 chronic kidney disease, or unspecified chronic kidney disease: Secondary | ICD-10-CM | POA: Diagnosis not present

## 2018-03-16 DIAGNOSIS — S51811D Laceration without foreign body of right forearm, subsequent encounter: Secondary | ICD-10-CM | POA: Diagnosis not present

## 2018-03-16 DIAGNOSIS — I5032 Chronic diastolic (congestive) heart failure: Secondary | ICD-10-CM | POA: Diagnosis not present

## 2018-03-16 DIAGNOSIS — E1151 Type 2 diabetes mellitus with diabetic peripheral angiopathy without gangrene: Secondary | ICD-10-CM | POA: Diagnosis not present

## 2018-03-16 DIAGNOSIS — E1122 Type 2 diabetes mellitus with diabetic chronic kidney disease: Secondary | ICD-10-CM | POA: Diagnosis not present

## 2018-03-16 DIAGNOSIS — Z89611 Acquired absence of right leg above knee: Secondary | ICD-10-CM | POA: Diagnosis not present

## 2018-03-16 DIAGNOSIS — N183 Chronic kidney disease, stage 3 unspecified: Secondary | ICD-10-CM

## 2018-03-16 DIAGNOSIS — I739 Peripheral vascular disease, unspecified: Secondary | ICD-10-CM | POA: Diagnosis not present

## 2018-03-16 DIAGNOSIS — I10 Essential (primary) hypertension: Secondary | ICD-10-CM

## 2018-03-16 DIAGNOSIS — E1142 Type 2 diabetes mellitus with diabetic polyneuropathy: Secondary | ICD-10-CM | POA: Diagnosis not present

## 2018-03-16 NOTE — Patient Instructions (Signed)
Medication Instructions:  Your physician recommends that you continue on your current medications as directed. Please refer to the Current Medication list given to you today.   If you need a refill on your cardiac medications before your next appointment, please call your pharmacy.   Lab work: TODAY: BMET, CBC  If you have labs (blood work) drawn today and your tests are completely normal, you will receive your results only by: Marland Kitchen MyChart Message (if you have MyChart) OR . A paper copy in the mail If you have any lab test that is abnormal or we need to change your treatment, we will call you to review the results.  Testing/Procedures: NONE  Follow-Up: At [facility], you and your health needs are our priority.  As part of our continuing mission to provide you with exceptional heart care, we have created designated Provider Care Teams.  These Care Teams include your primary Cardiologist (physician) and Advanced Practice Providers (APPs -  Physician Assistants and Nurse Practitioners) who all work together to provide you with the care you need, when you need it. . You will need a follow up appointment in:  4 months.  You may see Sherren Mocha, MD or one of the following Advanced Practice Providers on your designated Care Team: . Richardson Dopp, PA-C . Vin Bhagat, PA-C . Daune Perch, NP  Any Other Special Instructions Will Be Listed Below (If Applicable).

## 2018-03-17 DIAGNOSIS — S51811D Laceration without foreign body of right forearm, subsequent encounter: Secondary | ICD-10-CM | POA: Diagnosis not present

## 2018-03-17 DIAGNOSIS — Z4781 Encounter for orthopedic aftercare following surgical amputation: Secondary | ICD-10-CM | POA: Diagnosis not present

## 2018-03-17 DIAGNOSIS — E1151 Type 2 diabetes mellitus with diabetic peripheral angiopathy without gangrene: Secondary | ICD-10-CM | POA: Diagnosis not present

## 2018-03-17 DIAGNOSIS — I129 Hypertensive chronic kidney disease with stage 1 through stage 4 chronic kidney disease, or unspecified chronic kidney disease: Secondary | ICD-10-CM | POA: Diagnosis not present

## 2018-03-17 DIAGNOSIS — E1142 Type 2 diabetes mellitus with diabetic polyneuropathy: Secondary | ICD-10-CM | POA: Diagnosis not present

## 2018-03-17 DIAGNOSIS — E1122 Type 2 diabetes mellitus with diabetic chronic kidney disease: Secondary | ICD-10-CM | POA: Diagnosis not present

## 2018-03-17 LAB — CBC
HEMATOCRIT: 29.7 % — AB (ref 37.5–51.0)
HEMOGLOBIN: 9.7 g/dL — AB (ref 13.0–17.7)
MCH: 31.6 pg (ref 26.6–33.0)
MCHC: 32.7 g/dL (ref 31.5–35.7)
MCV: 97 fL (ref 79–97)
Platelets: 254 10*3/uL (ref 150–450)
RBC: 3.07 x10E6/uL — AB (ref 4.14–5.80)
RDW: 14.2 % (ref 11.6–15.4)
WBC: 7.5 10*3/uL (ref 3.4–10.8)

## 2018-03-17 LAB — BASIC METABOLIC PANEL
BUN/Creatinine Ratio: 7 — ABNORMAL LOW (ref 10–24)
BUN: 8 mg/dL (ref 8–27)
CALCIUM: 9 mg/dL (ref 8.6–10.2)
CO2: 26 mmol/L (ref 20–29)
Chloride: 101 mmol/L (ref 96–106)
Creatinine, Ser: 1.22 mg/dL (ref 0.76–1.27)
GFR calc Af Amer: 65 mL/min/{1.73_m2} (ref >59)
GFR calc non Af Amer: 56 mL/min/{1.73_m2} — ABNORMAL LOW (ref >59)
Glucose: 107 mg/dL — ABNORMAL HIGH (ref 65–99)
Potassium: 4.5 mmol/L (ref 3.5–5.2)
Sodium: 141 mmol/L (ref 134–144)

## 2018-03-18 ENCOUNTER — Telehealth: Payer: Self-pay | Admitting: *Deleted

## 2018-03-18 NOTE — Telephone Encounter (Signed)
[name] with [facility] requested OT 2wk2, 1wk1.  Approval given.

## 2018-03-23 DIAGNOSIS — G546 Phantom limb syndrome with pain: Secondary | ICD-10-CM | POA: Diagnosis not present

## 2018-03-23 DIAGNOSIS — Z8673 Personal history of transient ischemic attack (TIA), and cerebral infarction without residual deficits: Secondary | ICD-10-CM | POA: Diagnosis not present

## 2018-03-23 DIAGNOSIS — D631 Anemia in chronic kidney disease: Secondary | ICD-10-CM | POA: Diagnosis not present

## 2018-03-23 DIAGNOSIS — E785 Hyperlipidemia, unspecified: Secondary | ICD-10-CM | POA: Diagnosis not present

## 2018-03-23 DIAGNOSIS — Z4781 Encounter for orthopedic aftercare following surgical amputation: Secondary | ICD-10-CM | POA: Diagnosis not present

## 2018-03-23 DIAGNOSIS — G4700 Insomnia, unspecified: Secondary | ICD-10-CM | POA: Diagnosis not present

## 2018-03-23 DIAGNOSIS — Z89611 Acquired absence of right leg above knee: Secondary | ICD-10-CM | POA: Diagnosis not present

## 2018-03-23 DIAGNOSIS — N183 Chronic kidney disease, stage 3 (moderate): Secondary | ICD-10-CM | POA: Diagnosis not present

## 2018-03-23 DIAGNOSIS — K219 Gastro-esophageal reflux disease without esophagitis: Secondary | ICD-10-CM | POA: Diagnosis not present

## 2018-03-23 DIAGNOSIS — M1991 Primary osteoarthritis, unspecified site: Secondary | ICD-10-CM | POA: Diagnosis not present

## 2018-03-23 DIAGNOSIS — Z86718 Personal history of other venous thrombosis and embolism: Secondary | ICD-10-CM | POA: Diagnosis not present

## 2018-03-23 DIAGNOSIS — M5412 Radiculopathy, cervical region: Secondary | ICD-10-CM | POA: Diagnosis not present

## 2018-03-23 DIAGNOSIS — M810 Age-related osteoporosis without current pathological fracture: Secondary | ICD-10-CM | POA: Diagnosis not present

## 2018-03-23 DIAGNOSIS — I2510 Atherosclerotic heart disease of native coronary artery without angina pectoris: Secondary | ICD-10-CM | POA: Diagnosis not present

## 2018-03-23 DIAGNOSIS — E1142 Type 2 diabetes mellitus with diabetic polyneuropathy: Secondary | ICD-10-CM | POA: Diagnosis not present

## 2018-03-23 DIAGNOSIS — S51811D Laceration without foreign body of right forearm, subsequent encounter: Secondary | ICD-10-CM | POA: Diagnosis not present

## 2018-03-23 DIAGNOSIS — I129 Hypertensive chronic kidney disease with stage 1 through stage 4 chronic kidney disease, or unspecified chronic kidney disease: Secondary | ICD-10-CM | POA: Diagnosis not present

## 2018-03-23 DIAGNOSIS — I6529 Occlusion and stenosis of unspecified carotid artery: Secondary | ICD-10-CM | POA: Diagnosis not present

## 2018-03-23 DIAGNOSIS — C9211 Chronic myeloid leukemia, BCR/ABL-positive, in remission: Secondary | ICD-10-CM | POA: Diagnosis not present

## 2018-03-23 DIAGNOSIS — E1122 Type 2 diabetes mellitus with diabetic chronic kidney disease: Secondary | ICD-10-CM | POA: Diagnosis not present

## 2018-03-23 DIAGNOSIS — Z7901 Long term (current) use of anticoagulants: Secondary | ICD-10-CM | POA: Diagnosis not present

## 2018-03-23 DIAGNOSIS — F40240 Claustrophobia: Secondary | ICD-10-CM | POA: Diagnosis not present

## 2018-03-23 DIAGNOSIS — G7000 Myasthenia gravis without (acute) exacerbation: Secondary | ICD-10-CM | POA: Diagnosis not present

## 2018-03-23 DIAGNOSIS — E1151 Type 2 diabetes mellitus with diabetic peripheral angiopathy without gangrene: Secondary | ICD-10-CM | POA: Diagnosis not present

## 2018-03-23 DIAGNOSIS — Z86711 Personal history of pulmonary embolism: Secondary | ICD-10-CM | POA: Diagnosis not present

## 2018-03-24 DIAGNOSIS — E1122 Type 2 diabetes mellitus with diabetic chronic kidney disease: Secondary | ICD-10-CM | POA: Diagnosis not present

## 2018-03-24 DIAGNOSIS — E1142 Type 2 diabetes mellitus with diabetic polyneuropathy: Secondary | ICD-10-CM | POA: Diagnosis not present

## 2018-03-24 DIAGNOSIS — S51811D Laceration without foreign body of right forearm, subsequent encounter: Secondary | ICD-10-CM | POA: Diagnosis not present

## 2018-03-24 DIAGNOSIS — E1151 Type 2 diabetes mellitus with diabetic peripheral angiopathy without gangrene: Secondary | ICD-10-CM | POA: Diagnosis not present

## 2018-03-24 DIAGNOSIS — Z4781 Encounter for orthopedic aftercare following surgical amputation: Secondary | ICD-10-CM | POA: Diagnosis not present

## 2018-03-24 DIAGNOSIS — I129 Hypertensive chronic kidney disease with stage 1 through stage 4 chronic kidney disease, or unspecified chronic kidney disease: Secondary | ICD-10-CM | POA: Diagnosis not present

## 2018-03-28 DIAGNOSIS — E1142 Type 2 diabetes mellitus with diabetic polyneuropathy: Secondary | ICD-10-CM | POA: Diagnosis not present

## 2018-03-28 DIAGNOSIS — I129 Hypertensive chronic kidney disease with stage 1 through stage 4 chronic kidney disease, or unspecified chronic kidney disease: Secondary | ICD-10-CM | POA: Diagnosis not present

## 2018-03-28 DIAGNOSIS — E1151 Type 2 diabetes mellitus with diabetic peripheral angiopathy without gangrene: Secondary | ICD-10-CM | POA: Diagnosis not present

## 2018-03-28 DIAGNOSIS — Z4781 Encounter for orthopedic aftercare following surgical amputation: Secondary | ICD-10-CM | POA: Diagnosis not present

## 2018-03-28 DIAGNOSIS — S51811D Laceration without foreign body of right forearm, subsequent encounter: Secondary | ICD-10-CM | POA: Diagnosis not present

## 2018-03-28 DIAGNOSIS — E1122 Type 2 diabetes mellitus with diabetic chronic kidney disease: Secondary | ICD-10-CM | POA: Diagnosis not present

## 2018-03-31 DIAGNOSIS — E1122 Type 2 diabetes mellitus with diabetic chronic kidney disease: Secondary | ICD-10-CM | POA: Diagnosis not present

## 2018-03-31 DIAGNOSIS — I129 Hypertensive chronic kidney disease with stage 1 through stage 4 chronic kidney disease, or unspecified chronic kidney disease: Secondary | ICD-10-CM | POA: Diagnosis not present

## 2018-03-31 DIAGNOSIS — Z4781 Encounter for orthopedic aftercare following surgical amputation: Secondary | ICD-10-CM | POA: Diagnosis not present

## 2018-03-31 DIAGNOSIS — S51811D Laceration without foreign body of right forearm, subsequent encounter: Secondary | ICD-10-CM | POA: Diagnosis not present

## 2018-03-31 DIAGNOSIS — E1151 Type 2 diabetes mellitus with diabetic peripheral angiopathy without gangrene: Secondary | ICD-10-CM | POA: Diagnosis not present

## 2018-03-31 DIAGNOSIS — E1142 Type 2 diabetes mellitus with diabetic polyneuropathy: Secondary | ICD-10-CM | POA: Diagnosis not present

## 2018-04-05 ENCOUNTER — Encounter: Payer: Self-pay | Admitting: Family Medicine

## 2018-04-05 DIAGNOSIS — S0031XA Abrasion of nose, initial encounter: Secondary | ICD-10-CM

## 2018-04-06 ENCOUNTER — Ambulatory Visit (INDEPENDENT_AMBULATORY_CARE_PROVIDER_SITE_OTHER): Payer: Medicare Other | Admitting: Podiatry

## 2018-04-06 ENCOUNTER — Ambulatory Visit: Payer: Medicare Other | Admitting: Neurology

## 2018-04-06 DIAGNOSIS — L603 Nail dystrophy: Secondary | ICD-10-CM | POA: Diagnosis not present

## 2018-04-06 DIAGNOSIS — S78111A Complete traumatic amputation at level between right hip and knee, initial encounter: Secondary | ICD-10-CM

## 2018-04-06 DIAGNOSIS — I739 Peripheral vascular disease, unspecified: Secondary | ICD-10-CM

## 2018-04-06 MED ORDER — MUPIROCIN 2 % EX OINT: 1.0000 "application " | TOPICAL_OINTMENT | Freq: Two times a day (BID) | CUTANEOUS | 0 refills | Status: DC

## 2018-04-08 ENCOUNTER — Telehealth: Payer: Self-pay | Admitting: Cardiovascular Disease

## 2018-04-08 DIAGNOSIS — Z4781 Encounter for orthopedic aftercare following surgical amputation: Secondary | ICD-10-CM | POA: Diagnosis not present

## 2018-04-08 DIAGNOSIS — E1122 Type 2 diabetes mellitus with diabetic chronic kidney disease: Secondary | ICD-10-CM | POA: Diagnosis not present

## 2018-04-08 DIAGNOSIS — E1151 Type 2 diabetes mellitus with diabetic peripheral angiopathy without gangrene: Secondary | ICD-10-CM | POA: Diagnosis not present

## 2018-04-08 DIAGNOSIS — I129 Hypertensive chronic kidney disease with stage 1 through stage 4 chronic kidney disease, or unspecified chronic kidney disease: Secondary | ICD-10-CM | POA: Diagnosis not present

## 2018-04-08 DIAGNOSIS — E1142 Type 2 diabetes mellitus with diabetic polyneuropathy: Secondary | ICD-10-CM | POA: Diagnosis not present

## 2018-04-08 DIAGNOSIS — S51811D Laceration without foreign body of right forearm, subsequent encounter: Secondary | ICD-10-CM | POA: Diagnosis not present

## 2018-04-08 NOTE — Telephone Encounter (Signed)
PATIENT CALLED BACK.  PATIENT STATES HE ONLY HAS ONE LEG NOW. AND HE DOES NOT WAN TO LOSS IT.  HE STATES SHE IS HAVING NUMBNESS AT TIMES  BUT NOTHING NEW, NO  DISCOLORATION OR SWELLING NOTED. APPOINTMENT SCHEDULE FOR MARCH 24,2020. PATIENT VERBALIZED UNDERSTANDING.

## 2018-04-08 NOTE — Telephone Encounter (Signed)
New Message:       Pt says he is having problem with his leg and is concerned. I made  an appointment for 05-03-18 with Dr Fletcher Anon. Please call to evaluate, pt might need to be seen sooner.

## 2018-04-08 NOTE — Telephone Encounter (Signed)
Called patient, did not answer. Left call back number.

## 2018-04-10 NOTE — Progress Notes (Signed)
HPI: 78 year old male presenting today with a chief complaint of a painful left great toenail that began suddenly about one week ago. He is concerned for a possible infection or an ingrowing nail. Touching the nail or applying pressure to it increases the pain. He has not done anything for treatment. Patient is here for further evaluation and treatment.   Past Medical History:  Diagnosis Date  . Bruises easily    d/t being on Eliquis  . Carotid artery disease ([place])    a. Duplex 05/2014: 32-20% RICA, 2-54% LICA, elevated velocities in right subclavian artery, normal left subclavian artery..  . Chronic back pain    HNP  . Chronic kidney disease (CKD)   . Claustrophobia    takes Ativan as needed  . 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 Coronary atherosclerosis of unspecified type of vessel, native or graft    a. cath in 2003 showed 10-20% LM, scattered 20% prox-mid LAD, 30% more distal LAD, 50-60% stenosis of LAD towards apex, 20% Cx, 30% dRCA, focal 95% stenosis in smaller of 2 branches of PDA, EF 60-65%.  . Diabetes mellitus ([place])   . Diarrhea    takes Imodium daily as needed  . Diverticulosis   . Diverticulosis of colon (without mention of hemorrhage)   . DJD (degenerative joint disease)   . DVT (deep venous thrombosis) ([place]) 07/2011  . Esophageal stricture   . Essential hypertension    takes Imdur and Lisinopril daily  . Gastric polyps    benign  . GERD (gastroesophageal reflux disease)    takes Nexium daily  . Glaucoma    uses eye drops  . History of bronchitis    not sure when the last time  . History of colon polyps    benign  . History of kidney stones   . History of kidney stones   . Insomnia    takes Melatonin nightly as needed  . Mixed hyperlipidemia    takes Crestor daily  . Myasthenia gravis ([place])    uses prednisone  . Osteoporosis 2016  . Peripheral edema    takes Lasix daily  . Pituitary tumor    checks Dr.Walden at [facility]  checks it every Jan   . Pneumonia 01/2016  . PONV (postoperative nausea and vomiting)   . Ptosis   . Pulmonary embolus ([place]) 2012  . Stroke [facility]) 06/2015     Physical Exam: General: The patient is alert and oriented x3 in no acute distress.  Dermatology: Hyperkeratotic, discolored, thickened, onychodystrophy of left great toenail. Skin is warm, dry and supple left lower extremity. Negative for open lesions or macerations.  Vascular: Diminished pedal pulses left lower extremity. No edema or erythema noted. Capillary refill within normal limits.  Neurological: Epicritic and protective threshold grossly intact left lower extremity.   Musculoskeletal Exam: Range of motion within normal limits to all pedal and ankle joints of the left lower extremity. Muscle strength 5/5 in all groups.   Assessment: 1. Dystrophic nail left hallux 2. H/o AKA RLE 3. PVD LLE   Plan of Care:  1. Patient evaluated.  2. Mechanical debridement of the left great toenail performed using a nail nipper. Filed with dremel without incident.  3. Recommended good shoes for the left foot.  4. Continue management with vascular.  5. Return to clinic as needed.       Edrick Kins, DPM [facility]  Dr. Edrick Kins, DPM  [address], [place] [address]                Office [phone]  Fax [phone]

## 2018-04-13 ENCOUNTER — Encounter: Payer: Self-pay | Admitting: Physical Medicine & Rehabilitation

## 2018-04-13 ENCOUNTER — Encounter: Payer: Medicare Other | Attending: Registered Nurse | Admitting: Physical Medicine & Rehabilitation

## 2018-04-13 VITALS — BP 128/75 | HR 67

## 2018-04-13 DIAGNOSIS — M7062 Trochanteric bursitis, left hip: Secondary | ICD-10-CM | POA: Diagnosis not present

## 2018-04-13 DIAGNOSIS — I2510 Atherosclerotic heart disease of native coronary artery without angina pectoris: Secondary | ICD-10-CM | POA: Insufficient documentation

## 2018-04-13 DIAGNOSIS — S78111A Complete traumatic amputation at level between right hip and knee, initial encounter: Secondary | ICD-10-CM | POA: Insufficient documentation

## 2018-04-13 DIAGNOSIS — N183 Chronic kidney disease, stage 3 (moderate): Secondary | ICD-10-CM | POA: Insufficient documentation

## 2018-04-13 DIAGNOSIS — Z8673 Personal history of transient ischemic attack (TIA), and cerebral infarction without residual deficits: Secondary | ICD-10-CM | POA: Insufficient documentation

## 2018-04-13 DIAGNOSIS — G4701 Insomnia due to medical condition: Secondary | ICD-10-CM

## 2018-04-13 MED ORDER — TRAZODONE HCL 100 MG PO TABS: 100.0000 mg | ORAL_TABLET | Freq: Every day | ORAL | 2 refills | Status: DC

## 2018-04-13 NOTE — Patient Instructions (Signed)
I WILL BE HAPPY TO WRITE A PRESCRIPTION FOR YOUR PROSTHETIC LEG.    I WILL ALSO WRITE A PRESCRIPTION FOR THERAPY AT [facility]

## 2018-04-13 NOTE — Progress Notes (Signed)
Subjective:    Patient ID: [name], male    DOB: 1940-08-18, 78 y.o.   MRN: [id number]  HPI  Mr. 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  He has essentially completed home health therapies as he has met goals.  He is independent at a wheelchair level and doing quite well.  His pain levels are minimal at this point.  He is only using gabapentin and Tylenol as needed.  He is using Tylenol PM for sleep but does not always help a great deal.  He stopped using the trazodone as he was not finding the 50 mg was effective.  He wears a stump shrinker 24 hours a day and is doing some massage and other feedback for the limb.  He is going to be going through initial socket formation through the prosthetists this week.  He is quite excited about this.  Pain Inventory Average Pain 2 Pain Right Now 2 My pain is na  In the last 24 hours, has pain interfered with the following? General activity 10 Relation with others 0 Enjoyment of life 5 What TIME of day is your pain at its worst? varies Sleep (in general) Fair  Pain is worse with: na Pain improves with: na Relief from Meds: na  Mobility ability to climb steps?  no do you drive?  no  Function retired I need assistance with the following:  bathing, meal prep, household duties and shopping  Neuro/Psych trouble walking  Prior Studies Any changes since last visit?  no  Physicians involved in your care Any changes since last visit?  no   Family History  Problem Relation Age of Onset  . Heart disease Mother   . Heart attack Mother   . Stroke Mother   . Heart disease Father   . Heart attack Father   . Stroke Father   . Breast cancer Sister        Twin   . Heart attack Sister   . Dementia Sister   . Heart disease Sister   . Heart attack Sister   . Clotting disorder Sister   . Heart attack Sister   . Colon cancer Neg Hx    Social History   Socioeconomic History  . Marital status: Married   Spouse name: Hassan Rowan  . Number of children: 2  . Years of education: 1-College  . Highest education level: Not on file  Occupational History  . 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 Financial resource strain: Not on file  . Food insecurity:    Worry: Not on file    Inability: Not on file  . Transportation needs:    Medical: Not on file    Non-medical: Not on file  Tobacco Use  . Smoking status: Never Smoker  . Smokeless tobacco: Never Used  Substance and Sexual Activity  . Alcohol use: No    Alcohol/week: 0.0 standard drinks  . Drug use: No  . Sexual activity: Not Currently  Lifestyle  . Physical activity:    Days per week: Not on file    Minutes per session: Not on file  . Stress: Not on file  Relationships  . Social connections:    Talks on phone: Patient refused    Gets together: Patient refused    Attends religious service: Patient refused    Active member of club or organization: Patient refused    Attends meetings of  clubs or organizations: Patient refused    Relationship status: Patient refused  Other Topics Concern  . Not on file  Social History Narrative   Lives at home w/ his wife   Patient is right handed.   Patient has 1-2 cups of caffeine daily.   Past Surgical History:  Procedure Laterality Date  . ABDOMINAL AORTOGRAM W/LOWER EXTREMITY N/A 11/18/2016   Procedure: ABDOMINAL AORTOGRAM W/LOWER EXTREMITY;  Surgeon: [name], MD;  Location: [place] [facility];  Service: Cardiovascular;  Laterality: N/A;  . AMPUTATION Right 02/07/2018   Procedure: right leg AMPUTATION ABOVE KNEE;  Surgeon: [name], MD;  Location: [place];  Service: Vascular;  Laterality: Right;  . AORTOGRAM Right 12/23/2017   Procedure: AORTOGRAM, RIGHT LOWER EXTREMITY ANGIOGRAM, RIGHT SUPERFICIAL FEMORAL ARTERY ANGIOPLASTY WITH STENT, RIGHT POPLITEAL ARTERY ANGIOPLASTY WITH STENT, LEFT GROIN CUTDOWN AND LEFT FEMORAL ARTERY REPAIR;   Surgeon: [name], MD;  Location: [place];  Service: Vascular;  Laterality: Right;  . BACK SURGERY     x2, lumbar and cervical  . CARDIAC CATHETERIZATION    . CATARACT EXTRACTION Bilateral 12/12  . COLONOSCOPY    . ESOPHAGOGASTRODUODENOSCOPY ENDOSCOPY  04/2015  . IR GENERIC HISTORICAL  03/03/2016   IR IVC FILTER PLMT / S&I [name] GUID/MOD SED 03/03/2016 [name], MD [facility]  . IR GENERIC HISTORICAL  04/09/2016   IR RADIOLOGIST EVAL & MGMT 04/09/2016 [name], MD [facility]  . IR GENERIC HISTORICAL  04/30/2016   IR IVC FILTER RETRIEVAL / S&I [name] GUID/MOD SED 04/30/2016 [name], MD [facility]  . LITHOTRIPSY    . LUMBAR LAMINECTOMY/DECOMPRESSION MICRODISCECTOMY Right 09/14/2012   Procedure: Right Lumbar three-four, four-five, Lumbar five-Sacral one decompressive laminectomy;  Surgeon: [name], MD;  Location: [place] [facility];  Service: Neurosurgery;  Laterality: Right;  . LUMBAR LAMINECTOMY/DECOMPRESSION MICRODISCECTOMY Left 03/11/2016   Procedure: Left Lumbar Two-ThreeMicrodiscectomy;  Surgeon: [name], MD;  Location: [place];  Service: Neurosurgery;  Laterality: Left;  . PERIPHERAL VASCULAR INTERVENTION  11/18/2016   Procedure: PERIPHERAL VASCULAR INTERVENTION;  Surgeon: [name], MD;  Location: [place] [facility];  Service: Cardiovascular;;  Left popliteal  . SHOULDER SURGERY Left 05/07/2009  . TONSILLECTOMY     Past Medical History:  Diagnosis Date  . Bruises easily    d/t being on Eliquis  . Carotid artery disease ([place])    a. Duplex 05/2014: 75-17% RICA, 0-01% LICA, elevated velocities in right subclavian artery, normal left subclavian artery..  . Chronic back pain    HNP  . Chronic kidney disease (CKD)   . Claustrophobia    takes Ativan as needed  . 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 Coronary atherosclerosis of unspecified type of vessel, native or graft    a. cath in 2003 showed 10-20% LM, scattered 20%  prox-mid LAD, 30% more distal LAD, 50-60% stenosis of LAD towards apex, 20% Cx, 30% dRCA, focal 95% stenosis in smaller of 2 branches of PDA, EF 60-65%.  . Diabetes mellitus ([place])   . Diarrhea    takes Imodium daily as needed  . Diverticulosis   . Diverticulosis of colon (without mention of hemorrhage)   . DJD (degenerative joint disease)   . DVT (deep venous thrombosis) ([place]) 07/2011  . Esophageal stricture   . Essential hypertension    takes Imdur and Lisinopril daily  . Gastric polyps    benign  . GERD (gastroesophageal reflux disease)  takes Nexium daily  . Glaucoma    uses eye drops  . History of bronchitis    not sure when the last time  . History of colon polyps    benign  . History of kidney stones   . History of kidney stones   . Insomnia    takes Melatonin nightly as needed  . Mixed hyperlipidemia    takes Crestor daily  . Myasthenia gravis ([place])    uses prednisone  . Osteoporosis 2016  . Peripheral edema    takes Lasix daily  . Pituitary tumor    [facility] it every Jan   . Pneumonia 01/2016  . PONV (postoperative nausea and vomiting)   . Ptosis   . Pulmonary embolus ([place]) 2012  . Stroke ([place]) 06/2015   BP 128/75   Pulse 67   SpO2 98%   Opioid Risk Score:   Fall Risk Score:  `1  Depression screen PHQ 2/9  Depression screen [facility] 2/9 03/03/2018 12/16/2017 09/16/2017 07/12/2015  Decreased Interest 0 0 0 0  Down, Depressed, Hopeless 0 0 0 0  PHQ - 2 Score 0 0 0 0  Altered sleeping 1 - - 1  Tired, decreased energy 1 - - 1  Change in appetite 1 - - 0  Feeling bad or failure about yourself  0 - - 0  Trouble concentrating 0 - - 0  Moving slowly or fidgety/restless 0 - - 0  Suicidal thoughts 0 - - 0  PHQ-9 Score 3 - - 2  Difficult doing work/chores Not difficult at all - - Not difficult at all  Some recent data might be hidden   Review of Systems  Constitutional: Negative.   HENT: Negative.   Eyes: Negative.   Respiratory:  Negative.   Cardiovascular: Negative.   Gastrointestinal: Positive for diarrhea.  Endocrine: Negative.  Negative for polyphagia.  Genitourinary: Negative.   Musculoskeletal: Negative.   Skin: Negative.   Allergic/Immunologic: Negative.   Neurological: Negative.   Hematological: Negative.   Psychiatric/Behavioral: Negative.   All other systems reviewed and are negative.      Objective:   Physical Exam   General: Alert and oriented x 3, No apparent distress HEENT: Head is normocephalic, atraumatic, PERRLA, EOMI, sclera anicteric, oral mucosa pink and moist, dentition intact, ext ear canals clear,  Neck: Supple without JVD or lymphadenopathy Heart: Reg rate and rhythm. No murmurs rubs or gallops Chest: CTA bilaterally without wheezes, rales, or rhonchi; no distress Abdomen: Soft, non-tender, non-distended, bowel sounds positive. Extremities: No clubbing, cyanosis, or edema. Pulses are 2+ Skin: Clean and intact without signs of breakdown. Right AK stump well formed, non-tender, scarred over.  Neuro: Pt is cognitively appropriate with normal insight, memory, and awareness. Cranial nerves 2-12 are intact. Sensory exam is normal. Reflexes are 2+ in all 4's. Fine motor coordination is intact. No tremors. Motor function is grossly 5/5 in all available limbs  Musculoskeletal: Full ROM, No pain with AROM or PROM in the neck, trunk, or extremities. Posture appropriate Psych: Pt's affect is appropriate. Pt is cooperative        Assessment & Plan:  1.Functional deficitssecondary to PAD, right AKA.   -             -biotech shrinker down to size 4  -prosthetic order and therapy order as needed.   -HE'S  A K2 ambulator 2.PE/DVT Anticoagulation:eliquis 3. Pain Management:tylenol and gabapentin prn 4  Acute on chronic CKD:              -  Cr 1.67 1.9 5. JS:HFWYOVZ on mestinon andchronic steriods. 6 CML: In remission on Gleveec  7. HTN: controlled  8. Insomnia: may try  trazodone 166m for sleep, he'll call me if he needs a new rx.    Fifteen minutes of face to face patient care time were spent during this visit. All questions were encouraged and answered.  Follow up PRN

## 2018-04-14 ENCOUNTER — Other Ambulatory Visit: Payer: Medicare Other

## 2018-04-14 ENCOUNTER — Encounter: Payer: Self-pay | Admitting: Gastroenterology

## 2018-04-14 ENCOUNTER — Ambulatory Visit (INDEPENDENT_AMBULATORY_CARE_PROVIDER_SITE_OTHER): Payer: Medicare Other | Admitting: Gastroenterology

## 2018-04-14 VITALS — BP 106/54 | HR 66 | Ht 66.0 in | Wt 156.0 lb

## 2018-04-14 DIAGNOSIS — R197 Diarrhea, unspecified: Secondary | ICD-10-CM

## 2018-04-14 DIAGNOSIS — D132 Benign neoplasm of duodenum: Secondary | ICD-10-CM

## 2018-04-14 DIAGNOSIS — I2510 Atherosclerotic heart disease of native coronary artery without angina pectoris: Secondary | ICD-10-CM | POA: Diagnosis not present

## 2018-04-14 DIAGNOSIS — Z7901 Long term (current) use of anticoagulants: Secondary | ICD-10-CM | POA: Diagnosis not present

## 2018-04-14 NOTE — Progress Notes (Signed)
HPI :  78 year old male here for follow-up visit. 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 Unfortunately he was admitted and had another stroke this past June 2019. Since I have last seen him he underwent R AKA on 02/07/2018 and has been recovering from that.   His main complaint has chronically been loose stools and dyspepsia with nausea that we have presumed been due to the [place]. He states over the past few weeks his bowel movements have been more frequent than usual with more urgency. He is having at least 6 bowel movements per day. He's been taking Imodium is not helping too much. No fevers. He does endorse antibiotic use in the past 2 months. He denies any nausea, is not vomiting, he is eating okay. He denies any blood in the stools. He is seeing his oncologist in the next few days to discuss long-term regimen. He wants to come off the [place] of possible.  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 He has not been able to follow up for surveillance due to other medical problems. He remains on Eliquis.   Last colonoscopy 12/26/2013 - diverticulosis, otherwise normal EGD 04/23/15 - normal esophagus without pathology to cause dysphagia. Benign stomach polyps. 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  Past Medical History:  Diagnosis Date  . Bruises easily    d/t being on Eliquis  . Carotid artery disease ([place])    a. Duplex 05/2014: 25-85% RICA, 2-77% LICA, elevated velocities in right subclavian artery, normal left subclavian artery..  . Chronic back pain    HNP  . Chronic kidney disease (CKD)   . Claustrophobia    takes Ativan as needed  . 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  Coronary atherosclerosis of unspecified type of vessel, native or graft    a. cath in 2003 showed 10-20% LM, scattered 20% prox-mid LAD, 30% more distal LAD, 50-60% stenosis of LAD towards apex, 20% Cx, 30% dRCA, focal 95% stenosis in smaller of 2 branches of PDA, EF 60-65%.  . Diabetes mellitus ([place])   . Diarrhea    takes Imodium daily as needed  . Diverticulosis   . Diverticulosis of colon (without mention of hemorrhage)   . DJD (degenerative joint disease)   . DVT (deep venous thrombosis) ([place]) 07/2011  . Esophageal stricture   . Essential hypertension    takes Imdur and Lisinopril daily  . Gastric polyps    benign  . GERD (gastroesophageal reflux disease)    takes Nexium daily  . Glaucoma    uses eye drops  . History of bronchitis    not sure when the last time  . History of colon polyps    benign  . History of kidney stones   . History of kidney stones   . Insomnia    takes Melatonin nightly as needed  . Mixed hyperlipidemia    takes Crestor daily  . Myasthenia gravis ([place])    uses prednisone  . Osteoporosis 2016  . Peripheral edema    takes Lasix daily  . 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 Pneumonia 01/2016  . PONV (postoperative nausea and vomiting)   .  Ptosis   . Pulmonary embolus ([place]) 2012  . Stroke [facility]) 06/2015     Past Surgical History:  Procedure Laterality Date  . ABDOMINAL AORTOGRAM W/LOWER EXTREMITY N/A 11/18/2016   Procedure: ABDOMINAL AORTOGRAM W/LOWER EXTREMITY;  Surgeon: [name], MD;  Location: [place] [facility];  Service: Cardiovascular;  Laterality: N/A;  . AMPUTATION Right 02/07/2018   Procedure: right leg AMPUTATION ABOVE KNEE;  Surgeon: [name], MD;  Location: [place];  Service: Vascular;  Laterality: Right;  . AORTOGRAM Right 12/23/2017   Procedure: AORTOGRAM, RIGHT LOWER EXTREMITY ANGIOGRAM, RIGHT SUPERFICIAL FEMORAL ARTERY ANGIOPLASTY WITH STENT, RIGHT POPLITEAL ARTERY  ANGIOPLASTY WITH STENT, LEFT GROIN CUTDOWN AND LEFT FEMORAL ARTERY REPAIR;  Surgeon: [name], MD;  Location: [place];  Service: Vascular;  Laterality: Right;  . BACK SURGERY     x2, lumbar and cervical  . CARDIAC CATHETERIZATION    . CATARACT EXTRACTION Bilateral 12/12  . COLONOSCOPY    . ESOPHAGOGASTRODUODENOSCOPY ENDOSCOPY  04/2015  . IR GENERIC HISTORICAL  03/03/2016   IR IVC FILTER PLMT / S&I [name] GUID/MOD SED 03/03/2016 [name], MD [facility]  . IR GENERIC HISTORICAL  04/09/2016   IR RADIOLOGIST EVAL & MGMT 04/09/2016 [name], MD [facility]  . IR GENERIC HISTORICAL  04/30/2016   IR IVC FILTER RETRIEVAL / S&I [name] GUID/MOD SED 04/30/2016 [name], MD [facility]  . LITHOTRIPSY    . LUMBAR LAMINECTOMY/DECOMPRESSION MICRODISCECTOMY Right 09/14/2012   Procedure: Right Lumbar three-four, four-five, Lumbar five-Sacral one decompressive laminectomy;  Surgeon: [name], MD;  Location: [place] [facility];  Service: Neurosurgery;  Laterality: Right;  . LUMBAR LAMINECTOMY/DECOMPRESSION MICRODISCECTOMY Left 03/11/2016   Procedure: Left Lumbar Two-ThreeMicrodiscectomy;  Surgeon: [name], MD;  Location: [place];  Service: Neurosurgery;  Laterality: Left;  . PERIPHERAL VASCULAR INTERVENTION  11/18/2016   Procedure: PERIPHERAL VASCULAR INTERVENTION;  Surgeon: [name], MD;  Location: [place] [facility];  Service: Cardiovascular;;  Left popliteal  . SHOULDER SURGERY Left 05/07/2009  . TONSILLECTOMY     Family History  Problem Relation Age of Onset  . Heart disease Mother   . Heart attack Mother   . Stroke Mother   . Heart disease Father   . Heart attack Father   . Stroke Father   . Breast cancer Sister        Twin   . Heart attack Sister   . Dementia Sister   . Heart disease Sister   . Heart attack Sister   . Clotting disorder Sister   . Heart attack Sister   . Colon cancer Neg Hx    Social History   Tobacco Use  . Smoking status: Never Smoker    . Smokeless tobacco: Never Used  Substance Use Topics  . Alcohol use: No    Alcohol/week: 0.0 standard drinks  . Drug use: No   Current Outpatient Medications  Medication Sig Dispense Refill  . acetaminophen (TYLENOL) 325 MG tablet Take 1-2 tablets (325-650 mg total) by mouth every 4 (four) hours as needed for mild pain.    . brimonidine-timolol (COMBIGAN) 0.2-0.5 % ophthalmic solution Place 1 drop into both eyes 2 (two) times daily.     . cholecalciferol (VITAMIN D) 1000 units tablet Take 1,000 Units by mouth daily.     Marland Kitchen ELIQUIS 5 MG TABS tablet TAKE 1 TABLET (5 MG TOTAL) BY MOUTH 2 (TWO) TIMES DAILY. 60 tablet 3  . furosemide (LASIX) 40 MG tablet Take  0.5 tablets (20 mg total) by mouth daily. 45 tablet 3  . gabapentin (NEURONTIN) 100 MG capsule Take one to two at night as needed for "right ankle" pain. 60 capsule 1  . imatinib (GLEEVEC) 100 MG tablet Take 300 mg by mouth daily with lunch. Take with noon meal and a large glass of water.Caution:Chemotherapy    . isosorbide mononitrate (IMDUR) 30 MG 24 hr tablet Take 1 tablet (30 mg total) by mouth daily. 90 tablet 3  . lidocaine (LIDODERM) 5 % Place 1 patch onto the skin daily as needed (for back pain). Remove & Discard patch within 12 hours or as directed by MD     . loperamide (IMODIUM) 2 MG capsule Take by mouth as needed for diarrhea or loose stools.    . Multiple Vitamin (MULTIVITAMIN) tablet Take 1 tablet by mouth daily.    . mupirocin ointment (BACTROBAN) 2 % Place 1 application into the nose 2 (two) times daily. For 5 days. 22 g 0  . oxyCODONE-acetaminophen (PERCOCET/ROXICET) 5-325 MG tablet Take 1-2 tablets by mouth at bedtime as needed for severe pain. 7 tablet 0  . pantoprazole (PROTONIX) 40 MG tablet Take 1 tablet (40 mg total) by mouth daily. 30 tablet 0  . potassium chloride (K-DUR) 10 MEQ tablet Take 2 tablets (20 mEq total) by mouth daily. 60 tablet 0  . predniSONE (DELTASONE) 1 MG tablet TAKE 3 TABLETS BY MOUTH ONCE DAILY  WITH BREAKFAST 270 tablet 0  . pyridostigmine (MESTINON) 60 MG tablet Take 60 mg by mouth daily.    . rosuvastatin (CRESTOR) 10 MG tablet Take 5 mg by mouth daily.    . traZODone (DESYREL) 100 MG tablet Take 1 tablet (100 mg total) by mouth at bedtime. 30 tablet 2   No current facility-administered medications for this visit.    Allergies  Allergen Reactions  . Other Other (See Comments)    Regarding scans, the PATIENT IS CLAUSTROPHOBIC!!!  . Phenergan [Promethazine Hcl] Swelling and Other (See Comments)    Cannot be combined with Zofran because swelling of the eyes resulted and patient became very restless-  . Zofran [Ondansetron Hcl] Swelling and Other (See Comments)    Cannot be combined with Phenergan because swelling of the eyes resulted and patient became very restless-  . Sulfamethoxazole Rash and Itching     Review of Systems: All systems reviewed and negative except where noted in HPI.   Lab Results  Component Value Date   WBC 7.5 03/16/2018   HGB 9.7 (L) 03/16/2018   HCT 29.7 (L) 03/16/2018   MCV 97 03/16/2018   PLT 254 03/16/2018    Lab Results  Component Value Date   CREATININE 1.22 03/16/2018   BUN 8 03/16/2018   NA 141 03/16/2018   K 4.5 03/16/2018   CL 101 03/16/2018   CO2 26 03/16/2018    Lab Results  Component Value Date   ALT 19 02/11/2018   AST 39 02/11/2018   ALKPHOS 41 02/11/2018   BILITOT 0.5 02/11/2018     Physical Exam: BP (!) 106/54   Pulse 66   Ht 5\' 6"  (1.676 m)   Wt 156 lb (70.8 kg)   BMI 25.18 kg/m  Constitutional: Pleasant, male in no acute distress. HEENT: Normocephalic and atraumatic. Conjunctivae are normal. No scleral icterus. Neck supple.  Cardiovascular: Normal rate, regular rhythm.  Pulmonary/chest: Effort normal and breath sounds normal. No wheezing, rales or rhonchi. Abdominal: Soft, nondistended, nontender. . There are no masses palpable. No hepatomegaly.  Extremities: R AKA, no edema Lymphadenopathy: No cervical  adenopathy noted. Neurological: Alert and oriented to person place and time. Skin: Skin is warm and dry. No rashes noted. Psychiatric: Normal mood and affect. Behavior is normal.   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 Now with interval worsening following recent hospitalization and antibiotic use. I'm going to send stool for C. Difficile to ensure negative. Is positive we will treat it. If negative, will consider empiric trial of Colestid or switching to Lomotil. We'll also await his oncology visit to see if they feel that he can come off the [place] anytime soon. If he can, his bowel function may significantly improve. I will let him know the results of the stool study with further recommendations. He agreed  Duodenal adenoma / anticoagulated - we discussed whether or not he wanted any further surveillance endoscopy in light of the adenoma removed on his last EGD. He's had multiple strokes, remains on anticoagulation, now recovering from a leg amputation. If we were to perform an endoscopy could do it while on anticoagulation. Given his comorbidities and anticoagulated status, he wants to hold off on any further surveillance endoscopy after discussion of risks and benefits of performing endoscopy and holding off on endoscopy. He understands risks / benefits of each side of this issue. Wife in agreement.    Ages Cellar, MD [facility]

## 2018-04-14 NOTE — Patient Instructions (Signed)
If you are age 80 or older, your body mass index should be between 23-30. Your Body mass index is 25.18 kg/m. If this is out of the aforementioned range listed, please consider follow up with your Primary Care Provider.  If you are age 50 or younger, your body mass index should be between 19-25. Your Body mass index is 25.18 kg/m. If this is out of the aformentioned range listed, please consider follow up with your Primary Care Provider.   Please go to the lab in the basement of our building to have lab work done as you leave today. Hit "B" for basement when you get on the elevator.  When the doors open the lab is on your left.  We will call you with the results. Thank you.  Thank you for entrusting me with your care and for choosing [facility], Dr. [place] Cellar

## 2018-04-15 DIAGNOSIS — Z7901 Long term (current) use of anticoagulants: Secondary | ICD-10-CM | POA: Diagnosis not present

## 2018-04-15 DIAGNOSIS — Z86711 Personal history of pulmonary embolism: Secondary | ICD-10-CM | POA: Diagnosis not present

## 2018-04-15 DIAGNOSIS — Z89611 Acquired absence of right leg above knee: Secondary | ICD-10-CM | POA: Diagnosis not present

## 2018-04-15 DIAGNOSIS — C9211 Chronic myeloid leukemia, BCR/ABL-positive, in remission: Secondary | ICD-10-CM | POA: Diagnosis not present

## 2018-04-15 DIAGNOSIS — G7000 Myasthenia gravis without (acute) exacerbation: Secondary | ICD-10-CM | POA: Diagnosis not present

## 2018-04-15 DIAGNOSIS — C9210 Chronic myeloid leukemia, BCR/ABL-positive, not having achieved remission: Secondary | ICD-10-CM | POA: Diagnosis not present

## 2018-04-15 DIAGNOSIS — M810 Age-related osteoporosis without current pathological fracture: Secondary | ICD-10-CM | POA: Diagnosis not present

## 2018-04-15 DIAGNOSIS — Z8673 Personal history of transient ischemic attack (TIA), and cerebral infarction without residual deficits: Secondary | ICD-10-CM | POA: Diagnosis not present

## 2018-04-15 LAB — CLOSTRIDIUM DIFFICILE TOXIN B, QUALITATIVE, REAL-TIME PCR: Toxigenic C. Difficile by PCR: NOT DETECTED

## 2018-04-18 ENCOUNTER — Telehealth: Payer: Self-pay | Admitting: Gastroenterology

## 2018-04-18 NOTE — Telephone Encounter (Signed)
Pt's wife called to inform that pt has been taken off of chemo for trial run.  Wife requested medications for pt's nausea and diarrhea.

## 2018-04-19 ENCOUNTER — Telehealth: Payer: Self-pay | Admitting: Gastroenterology

## 2018-04-19 NOTE — Telephone Encounter (Signed)
Spoke wth pt wife she would like to speak with the nurse she stated that they were advised to call back if husband's symptoms do not get better.

## 2018-04-19 NOTE — Telephone Encounter (Signed)
Previous phone note from this morning has been sent to Dr Havery Moros

## 2018-04-19 NOTE — Telephone Encounter (Signed)
I spoke with the pt wife and she states the pt has continued to have diarrhea and would like to try lomotil.  Prescription called in to [facility].  Per [place] only 7 days can be sent to the first fill.

## 2018-04-19 NOTE — Telephone Encounter (Signed)
I'm a bit confused. [name] called [name] yesterday about the stool sample which was negative. He said since stopping Gleevac his diarrhea was better and he was using immodium as needed and it was working. Can you clarify what they need? My hope is that off Gleevac his symptoms may be much better controlled at this point. Thanks

## 2018-04-19 NOTE — Telephone Encounter (Signed)
Pt had OV on 3-5.  Please advise.

## 2018-05-02 DIAGNOSIS — N453 Epididymo-orchitis: Secondary | ICD-10-CM | POA: Diagnosis not present

## 2018-05-02 DIAGNOSIS — N400 Enlarged prostate without lower urinary tract symptoms: Secondary | ICD-10-CM | POA: Diagnosis not present

## 2018-05-03 ENCOUNTER — Ambulatory Visit: Payer: Medicare Other | Admitting: Cardiovascular Disease

## 2018-05-04 ENCOUNTER — Telehealth: Payer: Self-pay | Admitting: Gastroenterology

## 2018-05-04 NOTE — Telephone Encounter (Signed)
Pt wife cll and stated that the medication is not working her husband is still having lots of diarrhea and he is needing something stronger.

## 2018-05-04 NOTE — Telephone Encounter (Signed)
Spoke with the spouse. The husband is on Gleevac. He has not stopped it. He has now also been put on Doxycycline for a UTI. Diarrhea occurs before he eats and after. He is drinking sports drinks. He is taking the Lomotil when he wakes up in the mornings. He took two this morning. He has already had more 5 diarrhea stools today. Please advise. I did tell her to start Pedialyte.

## 2018-05-05 ENCOUNTER — Other Ambulatory Visit: Payer: Self-pay

## 2018-05-05 MED ORDER — DIPHENOXYLATE-ATROPINE 2.5-0.025 MG PO TABS: 1.0000 | ORAL_TABLET | Freq: Four times a day (QID) | ORAL | 0 refills | Status: DC | PRN

## 2018-05-05 MED ORDER — COLESTIPOL HCL 1 G PO TABS: 1.0000 g | ORAL_TABLET | Freq: Two times a day (BID) | ORAL | 1 refills | Status: DC

## 2018-05-05 NOTE — Telephone Encounter (Signed)
Called to patient. No answer. Left detailed message on the voicemail. Rx for Colestid and Lomotil faxed to [facility].

## 2018-05-05 NOTE — Telephone Encounter (Signed)
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 I thought he was able to come off the [place] but sounds like this did not happen. He can take lomotil in the AM and also later in the day as needed, up to 4 per day. Why don't we try him on Colestid as well, 1 gm BID, to see if that helps. He will need to ask the pharmacist when to take this in relation to his other medications to avoid absorption issues. Otherwise if symptoms continue to persist despite trial of multiple meds, may need to consider colonoscopy, but I do think this is from the [place]. Thanks

## 2018-05-09 ENCOUNTER — Ambulatory Visit: Payer: 59 | Admitting: Neurology

## 2018-05-10 ENCOUNTER — Other Ambulatory Visit: Payer: Self-pay | Admitting: Neurology

## 2018-05-11 ENCOUNTER — Telehealth: Payer: Self-pay

## 2018-05-11 NOTE — Telephone Encounter (Signed)
Received Rx request for Potassium Chloride 10 MEQ tablets. Rx was started by the hospital, okay to refill?

## 2018-05-18 ENCOUNTER — Other Ambulatory Visit: Payer: Self-pay | Admitting: Gastroenterology

## 2018-05-30 ENCOUNTER — Telehealth: Payer: Self-pay

## 2018-05-30 ENCOUNTER — Other Ambulatory Visit: Payer: Self-pay

## 2018-05-30 ENCOUNTER — Ambulatory Visit: Payer: Medicare Other | Attending: Neurology | Admitting: Physical Therapy

## 2018-05-30 ENCOUNTER — Encounter: Payer: Self-pay | Admitting: Physical Therapy

## 2018-05-30 DIAGNOSIS — R293 Abnormal posture: Secondary | ICD-10-CM | POA: Diagnosis not present

## 2018-05-30 DIAGNOSIS — M6281 Muscle weakness (generalized): Secondary | ICD-10-CM

## 2018-05-30 DIAGNOSIS — Z9181 History of falling: Secondary | ICD-10-CM

## 2018-05-30 DIAGNOSIS — R2681 Unsteadiness on feet: Secondary | ICD-10-CM | POA: Diagnosis not present

## 2018-05-30 DIAGNOSIS — R2689 Other abnormalities of gait and mobility: Secondary | ICD-10-CM | POA: Diagnosis not present

## 2018-05-30 NOTE — Telephone Encounter (Addendum)
Virtual Visit Pre-Appointment Phone Call  Steps For Call:  Confirm consent - "In the setting of the current Covid19 crisis, you are scheduled for a Doximity video visit with your provider on June 07, 2018 at 10:00. Just as we do with many in-office visits, in order for you to participate in this visit, we must obtain consent.  If you'd like, I can send this to your mychart (if signed up) or email for you to review.  Otherwise, I can obtain your verbal consent now.  All virtual visits are billed to your insurance company just like a normal visit would be.  By agreeing to a virtual visit, we'd like you to understand that the technology does not allow for your provider to perform an examination, and thus may limit your provider's ability to fully assess your condition. If your provider identifies any concerns that need to be evaluated in person, we will make arrangements to do so.  Finally, though the technology is pretty good, we cannot assure that it will always work on either your or our end, and in the setting of a video visit, we may have to convert it to a phone-only visit.  In either situation, we cannot ensure that we have a secure connection.  Are you willing to proceed?"   YES  1. Confirm the BEST phone number to call the day of the visit by including in appointment notes  2. Give patient instructions for WebEx/MyChart download to smartphone as below or Doximity/Doxy.me if video visit (depending on what platform provider is using)  3. Advise patient to be prepared with their blood pressure, heart rate, weight, any heart rhythm information, their current medicines, and a piece of paper and pen handy for any instructions they may receive the day of their visit  4. Inform patient they will receive a phone call 15 minutes prior to their appointment time (may be from unknown caller ID) so they should be prepared to answer  5. Confirm that appointment type is correct in Epic appointment notes  (VIDEO vs PHONE)     TELEPHONE CALL NOTE  [name] has been deemed a candidate for a follow-up tele-health visit to limit community exposure during the Covid-19 pandemic. I spoke with the patient via phone to ensure availability of phone/video source, confirm preferred email & phone number, and discuss instructions and expectations.  I reminded Romyn Boswell Delahunt to be prepared with any vital sign and/or heart rhythm information that could potentially be obtained via home monitoring, at the time of his visit. I reminded Diezel Mazur Deming to expect a phone call at the time of his visit if his visit.  Dolores Lory, [place] 05/30/2018 2:21 PM   INSTRUCTIONS FOR DOWNLOADING THE WEBEX APP TO SMARTPHONE  - If Apple, ask patient to go to [employer] and type in WebEx in the search bar. [place] [employer], the blue/green circle. If Android, go to [employer] and type in [employer] in the search bar. The app is free but as with any other app downloads, their phone may require them to verify saved payment information or Apple/Android password.  - The patient does NOT have to create an account. - On the day of the visit, the assist will walk the patient through joining the meeting with the meeting number/password.  INSTRUCTIONS FOR DOWNLOADING THE MYCHART APP TO SMARTPHONE  - The patient must first make sure to have activated MyChart and know their login information - If  Apple, go to [employer] and type in MyChart in the search bar and download the app. If Android, ask patient to go to [employer] and type in [place] in the search bar and download the app. The app is free but as with any other app downloads, their phone may require them to verify saved payment information or Apple/Android password.  - The patient will need to then log into the app with their MyChart username and password, and select [facility] as their healthcare provider to link the account. When it is time for your visit, go  to the MyChart app, find appointments, and click Begin Video Visit. Be sure to Select Allow for your device to access the Microphone and Camera for your visit. You will then be connected, and your provider will be with you shortly.  **If they have any issues connecting, or need assistance please contact MyChart service desk [phone])**  **If using a computer, in order to ensure the best quality for their visit they will need to use either of the following Internet Browsers: [employer], or Google Chrome**  IF USING DOXIMITY or DOXY.ME - The patient will receive a link just prior to their visit, either by text or email (to be determined day of appointment depending on if it's doxy.me or Doximity).     FULL LENGTH CONSENT FOR TELE-HEALTH VISIT   I hereby voluntarily request, consent and authorize [facility] and its employed or contracted physicians, physician assistants, nurse practitioners or other licensed health care professionals (the Practitioner), to provide me with telemedicine health care services (the "Services") as deemed necessary by the treating Practitioner. I acknowledge and consent to receive the Services by the Practitioner via telemedicine. I understand that the telemedicine visit will involve communicating with the Practitioner through live audiovisual communication technology and the disclosure of certain medical information by electronic transmission. I acknowledge that I have been given the opportunity to request an in-person assessment or other available alternative prior to the telemedicine visit and am voluntarily participating in the telemedicine visit.  I understand that I have the right to withhold or withdraw my consent to the use of telemedicine in the course of my care at any time, without affecting my right to future care or treatment, and that the Practitioner or I may terminate the telemedicine visit at any time. I understand that I have the right  to inspect all information obtained and/or recorded in the course of the telemedicine visit and may receive copies of available information for a reasonable fee.  I understand that some of the potential risks of receiving the Services via telemedicine include:  Marland Kitchen Delay or interruption in medical evaluation due to technological equipment failure or disruption; . Information transmitted may not be sufficient (e.g. poor resolution of images) to allow for appropriate medical decision making by the Practitioner; and/or  . In rare instances, security protocols could fail, causing a breach of personal health information.  Furthermore, I acknowledge that it is my responsibility to provide information about my medical history, conditions and care that is complete and accurate to the best of my ability. I acknowledge that Practitioner's advice, recommendations, and/or decision may be based on factors not within their control, such as incomplete or inaccurate data provided by me or distortions of diagnostic images or specimens that may result from electronic transmissions. I understand that the practice of medicine is not an exact science and that Practitioner makes no warranties or guarantees regarding treatment outcomes.  I acknowledge that I will receive a copy of this consent concurrently upon execution via email to the email address I last provided but may also request a printed copy by calling the office of [place].    I understand that my insurance will be billed for this visit.   I have read or had this consent read to me. . I understand the contents of this consent, which adequately explains the benefits and risks of the Services being provided via telemedicine.  . I have been provided ample opportunity to ask questions regarding this consent and the Services and have had my questions answered to my satisfaction. . I give my informed consent for the services to be provided through the use of  telemedicine in my medical care  By participating in this telemedicine visit I agree to the above.

## 2018-05-30 NOTE — Patient Instructions (Signed)
If wearing long pants, put pants on prosthesis first. Feed prosthesis thru left pant leg. You may have to take the shoe off.   1. Turn liner inside out. Make sure label is on top so strap is in front of leg. Fold liner along seams and place circle against bottom of your leg with fold level. Roll liner up your leg. You may need to scoot forward and turn to your right to clear your leg from w/c bottom.  2.  Feed strap thru slit in your prosthesis. Make sure strap is not twisted. Tighten strap when sitting so greater than 2 inches of rough and soft velcro overlap.  3. Stand up to a walker or counter. Wiggle your leg down into the prosthesis. Pull strap out of hole first then disconnect velcro & tighten as much as possible. 4. Pick left leg up 5-10 times putting weight on & off prosthesis. Readjust, pull strap out of hole first then disconnect velcro & tighten as much as possible. 5. Step left leg out & back 5-10 times putting weight on & off prosthesis. Readjust, pull strap out of hole first then disconnect velcro & tighten as much as possible.    6. Check strap only when standing. Good to check when finish toileting when standing before you pull your pants back up.   

## 2018-05-30 NOTE — Therapy (Signed)
[facility] [address] [place], [name], [address] Phone: [phone]   Fax:  [phone]  Physical Therapy Evaluation  Patient Details  Name: [name] MRN: [id number] Date of Birth: September 01, 1940 Referring Provider (PT): Tristan Ducking, MD   Encounter Date: 05/30/2018  PT End of Session - 05/30/18 1058    Visit Number  1    Number of Visits  20    Date for PT Re-Evaluation  08/26/18    Authorization Type  Medicare & [employer]    PT Start Time  1005    PT Stop Time  1100    PT Time Calculation (min)  55 min    Equipment Utilized During Treatment  Gait belt    Activity Tolerance  Patient tolerated treatment well    Behavior During Therapy  WFL for tasks assessed/performed       Past Medical History:  Diagnosis Date  . Bruises easily    d/t being on Eliquis  . Carotid artery disease ([place])    a. Duplex 05/2014: 83-15% RICA, 1-76% LICA, elevated velocities in right subclavian artery, normal left subclavian artery..  . Chronic back pain    HNP  . Chronic kidney disease (CKD)   . Claustrophobia    takes Ativan as needed  . 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 Coronary atherosclerosis of unspecified type of vessel, native or graft    a. cath in 2003 showed 10-20% LM, scattered 20% prox-mid LAD, 30% more distal LAD, 50-60% stenosis of LAD towards apex, 20% Cx, 30% dRCA, focal 95% stenosis in smaller of 2 branches of PDA, EF 60-65%.  . Diabetes mellitus ([place])   . Diarrhea    takes Imodium daily as needed  . Diverticulosis   . Diverticulosis of colon (without mention of hemorrhage)   . DJD (degenerative joint disease)   . DVT (deep venous thrombosis) ([place]) 07/2011  . Esophageal stricture   . Essential hypertension    takes Imdur and Lisinopril daily  . Gastric polyps    benign  . GERD (gastroesophageal reflux disease)    takes Nexium daily  . Glaucoma    uses eye drops  . History  of bronchitis    not sure when the last time  . History of colon polyps    benign  . History of kidney stones   . History of kidney stones   . Insomnia    takes Melatonin nightly as needed  . Mixed hyperlipidemia    takes Crestor daily  . Myasthenia gravis ([place])    uses prednisone  . Osteoporosis 2016  . Peripheral edema    takes Lasix daily  . Pituitary tumor    checks [name] at [facility] checks it every Jan   . Pneumonia 01/2016  . PONV (postoperative nausea and vomiting)   . Ptosis   . Pulmonary embolus ([place]) 2012  . Stroke [facility]) 06/2015    Past Surgical History:  Procedure Laterality Date  . ABDOMINAL AORTOGRAM W/LOWER EXTREMITY N/A 11/18/2016   Procedure: ABDOMINAL AORTOGRAM W/LOWER EXTREMITY;  Surgeon: [name], MD;  Location: [facility];  Service: Cardiovascular;  Laterality: N/A;  . AMPUTATION Right 02/07/2018   Procedure: right leg AMPUTATION ABOVE KNEE;  Surgeon: [name], MD;  Location: [facility];  Service: Vascular;  Laterality: Right;  . AORTOGRAM Right 12/23/2017   Procedure: AORTOGRAM, RIGHT LOWER EXTREMITY ANGIOGRAM, RIGHT SUPERFICIAL FEMORAL ARTERY ANGIOPLASTY WITH STENT,  RIGHT POPLITEAL ARTERY ANGIOPLASTY WITH STENT, LEFT GROIN CUTDOWN AND LEFT FEMORAL ARTERY REPAIR;  Surgeon: [name], MD;  Location: [facility];  Service: Vascular;  Laterality: Right;  . BACK SURGERY     x2, lumbar and cervical  . CARDIAC CATHETERIZATION    . CATARACT EXTRACTION Bilateral 12/12  . COLONOSCOPY    . ESOPHAGOGASTRODUODENOSCOPY ENDOSCOPY  04/2015  . IR GENERIC HISTORICAL  03/03/2016   IR IVC FILTER PLMT / S&I [name] GUID/MOD SED 03/03/2016 [name], MD [facility]  . IR GENERIC HISTORICAL  04/09/2016   IR RADIOLOGIST EVAL & MGMT 04/09/2016 [name], MD [facility] INTER[facility]  . IR GENERIC HISTORICAL  04/30/2016   IR IVC FILTER RETRIEVAL / S&I [name] GUID/MOD SED 04/30/2016 [name], MD [facility]INTER[facility]  . LITHOTRIPSY    . LUMBAR  LAMINECTOMY/DECOMPRESSION MICRODISCECTOMY Right 09/14/2012   Procedure: Right Lumbar three-four, four-five, Lumbar five-Sacral one decompressive laminectomy;  Surgeon: [name], MD;  Location: [facility];  Service: Neurosurgery;  Laterality: Right;  . LUMBAR LAMINECTOMY/DECOMPRESSION MICRODISCECTOMY Left 03/11/2016   Procedure: Left Lumbar Two-ThreeMicrodiscectomy;  Surgeon: [name], MD;  Location: [facility];  Service: Neurosurgery;  Laterality: Left;  . PERIPHERAL VASCULAR INTERVENTION  11/18/2016   Procedure: PERIPHERAL VASCULAR INTERVENTION;  Surgeon: [name], MD;  Location: [facility];  Service: Cardiovascular;;  Left popliteal  . SHOULDER SURGERY Left 05/07/2009  . TONSILLECTOMY      There were no vitals filed for this visit.   Subjective Assessment - 05/30/18 1012    Subjective  This 78yo male was referred by Tristan Ducking, MD on 05/23/2018 for prosthetic evalutation & gait training. He underwent a right Transfemoral Amputation on 02/07/2018 by Dr. [name] due to failed vascular surgery. He recieved his prosthesis 04/26/2018 but has not worn or used it as prosthetist advised due to high fall risk.     Patient is accompained by:  Family member   wife, [name]   Pertinent History   Back surgery X 4; Tonsillectomy; Cataract extraction (Bilateral, 12/12); Shoulder surgery (Left, 05/07/2009); Cardiac catheterization; Lumbar laminectomy/decompression microdiscectomy (Right, 09/14/2012); Esophagogastroduodenoscopy endoscopy (04/2015); ir generic historical (03/03/2016); Colonoscopy; Lithotripsy; Lumbar laminectomy/decompression microdiscectomy (Left, 03/11/2016); ir generic historical (04/09/2016); ir generic historical (04/30/2016); ABDOMINAL AORTOGRAM W/LOWER EXTREMITY (N/A, 11/18/2016); PERIPHERAL VASCULAR INTERVENTION (11/18/2016); Aortogram (Right, 12/23/2017); and Transfemoral Amputation (Right, 02/07/2018).    Limitations  Lifting;Standing;Walking;House hold  activities    Patient Stated Goals  He wants to walk with prosthesis, get around house & back in community. Try golf again.     Currently in Pain?  No/denies         [facility] PT Assessment - 05/30/18 1000      Assessment   Medical Diagnosis  Right Transfemoral Amputation    Referring Provider (PT)  Tristan Ducking, MD    Onset Date/Surgical Date  04/26/18   prosthesis delivery   Hand Dominance  Right    Prior Therapy  IP Rehab but none since      Precautions   Precautions  Fall      Restrictions   Weight Bearing Restrictions  No      Balance Screen   Has the patient fallen in the past 6 months  Yes   no injuries, was transferring w/c to couch when first home   How many times?  1    Has the patient had a decrease in activity level because of a fear of falling?   Yes  Is the patient reluctant to leave their home because of a fear of falling?   Yes      [place]  Private residence    Living Arrangements  Spouse/significant other    Type of [place] entrance    [place]  Two level   split level with basement that is storage, 2 stair lifts   Alternate Level Stairs-Number of Steps  6   has chair lift, 6 to upstairs & 9 to basement from [place] - 2 wheels;Cane - single point;Bedside commode;Tub bench;Hand held shower head;Grab bars - tub/shower;Transport chair;Wheelchair - manual      Prior Function   Level of Independence  Independent;Independent with household mobility without device;Independent with community mobility without device;Independent with gait    Vocation  Retired    Office manager, travel [employer]   Posture/Postural Control  Postural limitations    Postural Limitations  Rounded Shoulders;Forward head;Flexed trunk;Weight shift left      ROM / Strength   AROM / PROM / Strength  AROM;Strength      AROM    Overall AROM   Deficits    Overall AROM Comments  bilateral hip flexion limitations noted in standing (~20-30*)      Strength   Overall Strength  Deficits    Overall Strength Comments  bilateral hip strength grossly 3-4/5, left knee extension 4/5      Transfers   Transfers  Sit to Stand;Stand to Sit    Sit to Stand  4: Min guard;5: Supervision;With upper extremity assist;With armrests;From chair/3-in-1   to RW with TFA prosthesis   Sit to Stand Details  Visual cues for safe use of DME/AE;Visual cues/gestures for sequencing;Verbal cues for sequencing;Verbal cues for technique    Sit to Stand Details (indicate cue type and reason)  PT demo technique for TFA prosthesis prior to transfer    Stand to Sit  5: Supervision;With upper extremity assist;With armrests;To chair/3-in-1   from RW with TFA prosthesis   Stand to Sit Details (indicate cue type and reason)  Visual cues for safe use of DME/AE;Visual cues/gestures for sequencing;Verbal cues for sequencing;Verbal cues for technique    Stand to Sit Details  PT demo technique for TFA prosthesis prior to transfer      Ambulation/Gait   Ambulation/Gait  Yes    Ambulation/Gait Assistance  3: Mod assist;4: Min assist   [place] with prosthetic knee instability   Ambulation/Gait Assistance Details  excessive UE weight bearing on RW, manual assist intermittently for prosthetic knee control    Ambulation Distance (Feet)  30 Feet    Assistive device  Rolling walker;Prosthesis    Gait Pattern  Step-to pattern;Decreased step length - left;Decreased stance time - right;Decreased stride length;Decreased hip/knee flexion - right;Decreased weight shift to right;Right hip hike;Right circumduction;Antalgic;Lateral hip instability;Trunk flexed;Narrow base of support;Poor foot clearance - right    Ambulation Surface  Level;Indoor    Gait velocity  0.27 ft/sec      Balance   Balance Assessed  Yes      Static Standing Balance   Static Standing - Balance Support   Bilateral upper extremity supported    Static Standing - Level of Assistance  5: Stand by assistance    Static Standing - Comment/# of Minutes  2 minutes static stance  Dynamic Standing Balance   Dynamic Standing - Balance Support  Left upper extremity supported;During functional activity    Dynamic Standing - Level of Assistance  4: Min assist;5: Stand by assistance   MinA reaching & Supervision scanning head mvmts only   Dynamic Standing - Balance Activities  Head nods;Head turns;Reaching for objects    Dynamic Standing - Comments  scans with head only no trunk movements;  reaches 2" anteriorly & to thigh level with minA for balance loss      Prosthetics Assessment - 05/30/18 1005      Prosthetics   Prosthetic Care Dependent with  Skin check;Residual limb care;Care of non-amputated limb;Prosthetic cleaning;Ply sock cleaning;Correct ply sock adjustment;Proper wear schedule/adjustment;Proper weight-bearing schedule/adjustment    Donning prosthesis   Mod assist    Doffing prosthesis   Supervision    Current prosthetic wear tolerance (days/week)   no wear since delivery 34 days prior to PT evaluation    Current prosthetic wear tolerance (#hours/day)   0 hours prior to eval, PT recommended 2hrs 2x/day sitting.     Current prosthetic weight-bearing tolerance (hours/day)   Pt tolerated 5 minutes standing & gait without c/o pain or discomfort.     Edema  mild pitting with 3 sec refill on limb    Residual limb condition   no open areas, mild dry skin, normal color, limited hair growth, cylindercal shape    K code/activity level with prosthetic use   K2 level, silicon liner with velcro lanyard suspension, SAFETY (single axis friction engaging) knee that flexes with unweighting, single axis foot               Objective measurements completed on examination: See above findings.      [facility] Adult PT Treatment/Exercise - 05/30/18 1005      Prosthetics   Prosthetic Care Comments   PT  advised to only stand with wife assistance with walker or sink/counter support. NO walking outside of PT yet.     Education Provided  Skin check;Residual limb care;Prosthetic cleaning;Correct ply sock adjustment;Proper Donning;Proper Doffing;Proper wear schedule/adjustment    Person(s) Educated  Patient;Spouse    Education Method  Explanation;Demonstration;Tactile cues;Verbal cues    Education Method  Verbalized understanding;Returned demonstration;Tactile cues required;Verbal cues required;Needs further instruction               PT Short Term Goals - 05/30/18 1155      PT SHORT TERM GOAL #1   Title  Patient demonstrates proper donning including tightening suspension strap. (All STGs Target Date: 06/30/2018)    Time  1    Period  Months    Status  New    Target Date  06/30/18      PT SHORT TERM GOAL #2   Title  Patient tolerates wear of prosthesis >8hrs total /day without skin or limb pain issues.     Time  1    Period  Months    Status  New    Target Date  06/30/18      PT SHORT TERM GOAL #3   Title  Patient reaches 5" anteriorly & to knee level with RW suppport with prosthesis with supervision & no loss of balance.     Time  1    Period  Months    Status  New    Target Date  06/30/18      PT SHORT TERM GOAL #4   Title  Patient ambulates 150' with RW & prosthesis with minA.  Time  1    Period  Months    Status  New    Target Date  06/30/18      PT SHORT TERM GOAL #5   Title  Patient demonstrates & verbalizes understanding of initial HEP.     Time  1    Period  Months    Status  New    Target Date  06/30/18        PT Long Term Goals - 05/30/18 1150      PT LONG TERM GOAL #1   Title  Patient & wife verbalize & demonstrate understanding of proper prosthetic care to enable safe use of prosthesis. (All LTGs Target Date: 08/26/2018)    Time  12    Period  Weeks    Status  New    Target Date  08/26/18      PT LONG TERM GOAL #2   Title  Patient tolerates  wearing prosthesis >90% of awake hours without skin or limb pain issues to enable function throughout his day.     Time  12    Period  Weeks    Status  New    Target Date  08/26/18      PT LONG TERM GOAL #3   Title  Patient standing balance with RW support modified independent: reaching 10" anteriorly, pick up item from floor & scans environment.     Time  12    Period  Weeks    Status  New    Target Date  08/26/18      PT LONG TERM GOAL #4   Title  Patient ambulates 300' with RW & prosthesis modified independent to enable community mobility.     Time  12    Period  Weeks    Status  New    Target Date  08/26/18      PT LONG TERM GOAL #5   Title  Patient negotiates ramps & curbs with RW & prosthesis modified independent to enable community access.     Time  12    Period  Weeks    Status  New    Target Date  08/26/18             Plan - 05/30/18 1136    Clinical Impression Statement  This 78yo male underwent a right Transfemoral Amputation 02/07/2018 & received his first prosthesis on 04/26/2018. He has history of 4 back surgeries & 3 CVA/TIAs with most recent June 2019. He was ambulating without device at community level in October 2019. He is dependent in proper prosthetic care & use. He has limited wear which limits ability to function throughout his day. Patient has weakness & hip flexor tightness in BLEs. Patient has impaired standing balance requiring UE support & minimal Assist for reaching even with RW support. Sit to/from stand with transfemoral prosthesis requires instruction & min guard using armrests & RW to stabilize. Prosthetic gait is dependent on RW & requires moderate assist for buckling of prosthesis. He has gait deviations indicating high fall risk. Patient would benefit from skilled PT instruction to improve safety & mobility.     Personal Factors and Comorbidities  Age;Comorbidity 3+;Fitness;Past/Current Experience    Comorbidities  Back surgery x 4;  Tonsillectomy; Cataract extraction (Bilateral, 12/12); Shoulder surgery (Left, 05/07/2009); Cardiac catheterization; Lumbar laminectomy/decompression microdiscectomy (Right, 09/14/2012); Esophagogastroduodenoscopy endoscopy (04/2015); ir generic historical (03/03/2016); Colonoscopy; Lithotripsy; Lumbar laminectomy/decompression microdiscectomy (Left, 03/11/2016); ir generic historical (04/09/2016); ir generic historical (04/30/2016); ABDOMINAL AORTOGRAM W/LOWER EXTREMITY (  N/A, 11/18/2016); PERIPHERAL VASCULAR INTERVENTION (11/18/2016); Aortogram (Right, 12/23/2017); and Amputation Transfemoral (Right, 02/07/2018).    Examination-Activity Limitations  Hygiene/Grooming;Lift;Locomotion Level;Reach Overhead;Stairs;Stand;Toileting;Transfers    Examination-Participation Restrictions  Church;Community Activity;Driving    Stability/Clinical Decision Making  Evolving/Moderate complexity    Clinical Decision Making  Moderate    Rehab Potential  Good    PT Frequency  2x / week    PT Duration  12 weeks    PT Treatment/Interventions  ADLs/Self Care Home Management;Stair training;Gait training;DME Instruction;Functional mobility training;Therapeutic activities;Therapeutic exercise;Balance training;Neuromuscular re-education;Patient/family education;Prosthetic Training;Manual techniques;Vestibular    PT Next Visit Plan  review prosthetic care, HEP at sink, prosthetic gait with RW    Consulted and Agree with Plan of Care  Patient;Family member/caregiver    Family Member Consulted  wife, Markevion Lattin       Patient will benefit from skilled therapeutic intervention in order to improve the following deficits and impairments:  Abnormal gait, Decreased activity tolerance, Decreased balance, Decreased endurance, Decreased knowledge of use of DME, Decreased mobility, Decreased range of motion, Decreased strength, Impaired flexibility, Prosthetic Dependency, Postural dysfunction  Visit Diagnosis: Muscle weakness  (generalized)  Abnormal posture  History of falling  Unsteadiness on feet  Other abnormalities of gait and mobility     Problem List Patient Active Problem List   Diagnosis Date Noted  . Insomnia due to medical condition 04/13/2018  . Atherosclerosis of native coronary artery of native heart without angina pectoris   . Hypokalemia   . Stage 3 chronic kidney disease ([place])   . 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 Acute blood loss anemia 02/08/2018  . Post-operative pain   . Unilateral AKA, right ([place])   . Ischemic foot 02/05/2018  . Ischemia of right lower extremity   . Pressure injury of skin 12/27/2017  . Arterial occlusive disease   . Postoperative pain   . CKD (chronic kidney disease), stage III ([place])   . History of CVA (cerebrovascular accident)   . 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 Septic arthritis of left sternoclavicular joint ([place]) 11/03/2017  . Cervical spinal cord compression ([place]) 10/30/2017  . Nasal abscess 09/25/2017  . Immunocompromised ([place]) 09/25/2017  . Acute on chronic blood loss anemia 08/05/2017  . Transient ischemic attack (TIA) 07/23/2017  . Subcortical infarction ([place])   . Right hemiparesis ([place])   . Ataxia 07/21/2017  . TIA (transient ischemic attack)   . Wheezing 06/17/2017  . Bursitis of left elbow 05/01/2017  . Dystonia 12/29/2016  . PAD (peripheral artery disease) ([place]) 11/18/2016  . Osteoporosis 08/28/2016  . Paronychia of great toe, left 08/10/2016  . Carotid arterial disease ([place]) 07/09/2016  . Hypocalcemia 07/09/2016  . Diplopia 07/02/2016  . S/P lumbar laminectomy 03/11/2016  . Radicular pain of left lower extremity 02/21/2016  . Cough 01/10/2016  . 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 Myasthenia gravis ([place])   . Diabetes mellitus type 2 in nonobese (HCC)   . Ataxia, post-stroke   . Gait disturbance, post-stroke   . Proximal leg weakness   . Cervical spondylosis without myelopathy    . Achilles tendinitis of right lower extremity   . Chronic diastolic CHF (congestive heart failure) ([place])   . Gastroesophageal reflux disease without esophagitis   . 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 Cerebellar stroke ([place])   . Ischemic stroke ([place])   . Cerebrovascular accident (CVA) due to thrombosis of left vertebral artery ([place])   . History of pulmonary  embolus ([place])   . Chronic anticoagulation   . HLD (hyperlipidemia)   . Neck pain 03/19/2015  . Pain in the chest   . 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[place])   . Diabetic gastroparesis ([place]) 03/07/2015  . Screening for osteoporosis 07/19/2014  . Dyspnea 04/09/2014  . Edema 04/09/2014  . Routine general medical examination at a health care facility 10/23/2013  . Bilateral carotid bruits 05/24/2013  . Abnormal CT scan, lung 05/24/2013  . Encounter for therapeutic drug monitoring 03/14/2013  . Lymphadenopathy, inguinal 01/11/2013  . AKI (acute kidney injury) ([place]) 10/28/2012  . 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 Myasthenia gravis with exacerbation ([place]) 03/31/2012  . Disease of salivary gland 12/09/2011  . Left facial pain 10/28/2011  . Pulmonary embolism ([place]) 10/25/2011  . 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 GERD with stricture 09/23/2011  . Long term (current) use of anticoagulants 08/03/2011  . History of pulmonary embolism 07/30/2011  . History of DVT (deep vein thrombosis) 07/30/2011  . Subconjunctival hemorrhage 06/22/2011  . Bilateral conjunctivitis 04/09/2011  . Diabetes mellitus type 2, controlled ([place]) 12/18/2010  . Hearing loss 08/04/2010  . Ptosis of eyelid 12/30/2009  . Essential hypertension 04/24/2009  . HYPERLIPIDEMIA, MIXED 10/05/2008  . HEMORRHOIDS-INTERNAL 07/02/2008  . HEMORRHOIDS-EXTERNAL 07/02/2008  . DIVERTICULOSIS-COLON 07/02/2008  . PERSONAL HX COLONIC POLYPS 07/02/2008  . RASH-NONVESICULAR 02/07/2008  . PNEUMONIA  12/08/2007  . Chronic myeloid leukemia in remission ([place]) 07/07/2007  . CAD (coronary artery disease) 07/07/2007  . NEPHROLITHIASIS, HX OF 07/07/2007    Jamey Reas PT, DPT 05/30/2018, 11:59 AM  [facility] [address] [place], [name], [address] Phone: [phone]   Fax:  [phone]  Name: [name] MRN: [id number] Date of Birth: 1940/10/24

## 2018-06-02 ENCOUNTER — Ambulatory Visit: Payer: No Typology Code available for payment source | Admitting: Family Medicine

## 2018-06-03 ENCOUNTER — Ambulatory Visit: Payer: No Typology Code available for payment source | Admitting: Family Medicine

## 2018-06-06 ENCOUNTER — Telehealth: Payer: Self-pay | Admitting: Cardiovascular Disease

## 2018-06-06 NOTE — Telephone Encounter (Signed)
Smartphone/consent/ my chart/ pre reg completed °

## 2018-06-07 ENCOUNTER — Telehealth (INDEPENDENT_AMBULATORY_CARE_PROVIDER_SITE_OTHER): Payer: Medicare Other | Admitting: Cardiovascular Disease

## 2018-06-07 ENCOUNTER — Encounter: Payer: Self-pay | Admitting: Cardiovascular Disease

## 2018-06-07 VITALS — BP 140/77 | HR 82 | Ht 66.0 in

## 2018-06-07 DIAGNOSIS — I739 Peripheral vascular disease, unspecified: Secondary | ICD-10-CM

## 2018-06-07 NOTE — Progress Notes (Signed)
Virtual Visit via Video Note   This visit type was conducted due to national recommendations for restrictions regarding the COVID-19 Pandemic (e.g. social distancing) in an effort to limit this patient's exposure and mitigate transmission in our community.  Due to his co-morbid illnesses, this patient is at least at moderate risk for complications without adequate follow up.  This format is felt to be most appropriate for this patient at this time.  All issues noted in this document were discussed and addressed.  A limited physical exam was performed with this format.  Please refer to the patient's chart for his consent to telehealth for [facility]   Evaluation Performed:  Follow-up visit  Date:  06/07/2018   ID:  [name], [name] [birth year], MRN [id number]  Patient Location: Home Provider Location: Office  PCP:  Libby Maw, MD  Cardiologist:  Sherren Mocha, MD  Electrophysiologist:  None   Chief Complaint:  f/u  History of Present Illness:    [name] is a 78 y.o. male who was seen for a video visit regarding peripheral arterial disease. He has chronic medical conditions that include coronary artery disease, diastolic heart failure, chronic myelogenous leukemia in remission, myasthenia gravis, recurrent DVT/pulmonary embolism (onchronic anticoagulation), peripheral arterial disease, chronic kidney disease and recurrent stroke. 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    He was seen in 2018 for severe bilateral calf discomfort with minimal distance walking. Noninvasive vascular evaluation showed severely reduced ABI on the right side and moderately reduced on the left side. Duplex was overall suboptimal. It did show occluded left SFA.  I proceeded with CO2 angiography in October 2018 which showed no significant aortoiliac disease.  There was heavily calcified occluded distal left  SFA into the proximal left popliteal artery with three-vessel runoff below the knee.  This was treated with drug-coated balloon angioplasty and self-expanding stent placement due to flow-limiting dissection. On the right side, there was a calcified eccentric stenosis affecting the common femoral artery with borderline significant disease affecting the distal SFA and distal popliteal arteries. He presented in late 2019 with acute limb ischemia affecting the right lower extremity with evidence of occluded popliteal artery by duplex.  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   He was fitted with a prosthesis but has not used it much lately due to inability to follow-up at the present time.  He complains of increased pain in the left foot with numbness which is new for him.  He also noted increased swelling in the left ankle. No chest pain or worsening dyspnea.   The patient does not have symptoms concerning for COVID-19 infection (fever, chills, cough, or new shortness of breath).    Past Medical History:  Diagnosis Date  . Bruises easily    d/t being on Eliquis  . Carotid artery disease ([place])    a. Duplex 05/2014: 99-35% RICA, 7-01% LICA, elevated velocities in right subclavian artery, normal left subclavian artery..  . Chronic back pain    HNP  . Chronic kidney disease (CKD)   . Claustrophobia    takes Ativan as needed  . 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 Coronary atherosclerosis of unspecified type of vessel, native or graft    a. cath in 2003 showed 10-20% LM, scattered 20% prox-mid LAD, 30% more distal LAD, 50-60%  stenosis of LAD towards apex, 20% Cx, 30% dRCA, focal 95% stenosis in smaller of 2 branches of PDA, EF 60-65%.  . Diabetes mellitus ([place])   . Diarrhea    takes Imodium daily as needed  . Diverticulosis   . Diverticulosis of colon (without mention of hemorrhage)   .  DJD (degenerative joint disease)   . DVT (deep venous thrombosis) ([place]) 07/2011  . Esophageal stricture   . Essential hypertension    takes Imdur and Lisinopril daily  . Gastric polyps    benign  . GERD (gastroesophageal reflux disease)    takes Nexium daily  . Glaucoma    uses eye drops  . History of bronchitis    not sure when the last time  . History of colon polyps    benign  . History of kidney stones   . History of kidney stones   . Insomnia    takes Melatonin nightly as needed  . Mixed hyperlipidemia    takes Crestor daily  . Myasthenia gravis ([place])    uses prednisone  . Osteoporosis 2016  . Peripheral edema    takes Lasix daily  . Pituitary tumor    checks Dr[facility]Walden at [facility] checks it every Jan   . Pneumonia 01/2016  . PONV (postoperative nausea and vomiting)   . Ptosis   . Pulmonary embolus ([place]) 2012  . Stroke [facility]) 06/2015   Past Surgical History:  Procedure Laterality Date  . ABDOMINAL AORTOGRAM W/LOWER EXTREMITY N/A 11/18/2016   Procedure: ABDOMINAL AORTOGRAM W/LOWER EXTREMITY;  Surgeon: [name], MD;  Location: [place] CV LAB;  Service: Cardiovascular;  Laterality: N/A;  . AMPUTATION Right 02/07/2018   Procedure: right leg AMPUTATION ABOVE KNEE;  Surgeon: [name], MD;  Location: [place];  Service: Vascular;  Laterality: Right;  . AORTOGRAM Right 12/23/2017   Procedure: AORTOGRAM, RIGHT LOWER EXTREMITY ANGIOGRAM, RIGHT SUPERFICIAL FEMORAL ARTERY ANGIOPLASTY WITH STENT, RIGHT POPLITEAL ARTERY ANGIOPLASTY WITH STENT, LEFT GROIN CUTDOWN AND LEFT FEMORAL ARTERY REPAIR;  Surgeon: [name], MD;  Location: [place];  Service: Vascular;  Laterality: Right;  . BACK SURGERY     x2, lumbar and cervical  . CARDIAC CATHETERIZATION    . CATARACT EXTRACTION Bilateral 12/12  . COLONOSCOPY    . ESOPHAGOGASTRODUODENOSCOPY ENDOSCOPY  04/2015  . IR GENERIC HISTORICAL  03/03/2016   IR IVC FILTER PLMT / S&I [name] GUID/MOD SED 03/03/2016 [name], MD [facility]INTER[facility] RAD  . IR GENERIC HISTORICAL  04/09/2016   IR RADIOLOGIST EVAL & MGMT 04/09/2016 [name], MD [facility] INTERV RAD  . IR GENERIC HISTORICAL  04/30/2016   IR IVC FILTER RETRIEVAL / S&I [name] GUID/MOD SED 04/30/2016 [name], MD [facility]-INTERV RAD  . LITHOTRIPSY    . LUMBAR LAMINECTOMY/DECOMPRESSION MICRODISCECTOMY Right 09/14/2012   Procedure: Right Lumbar three-four, four-five, Lumbar five-Sacral one decompressive laminectomy;  Surgeon: [name], MD;  Location: [place] NEURO ORS;  Service: Neurosurgery;  Laterality: Right;  . LUMBAR LAMINECTOMY/DECOMPRESSION MICRODISCECTOMY Left 03/11/2016   Procedure: Left Lumbar Two-ThreeMicrodiscectomy;  Surgeon: [name], MD;  Location: [place];  Service: Neurosurgery;  Laterality: Left;  . PERIPHERAL VASCULAR INTERVENTION  11/18/2016   Procedure: PERIPHERAL VASCULAR INTERVENTION;  Surgeon: [name], MD;  Location: [place] CV LAB;  Service: Cardiovascular;;  Left popliteal  . SHOULDER SURGERY Left 05/07/2009  . TONSILLECTOMY       Current Meds  Medication Sig  . acetaminophen (TYLENOL) 325 MG tablet Take 1-2 tablets (325-650 mg total)  by mouth every 4 (four) hours as needed for mild pain.  . brimonidine-timolol (COMBIGAN) 0.2-0.5 % ophthalmic solution Place 1 drop into both eyes 2 (two) times daily.   . cholecalciferol (VITAMIN D) 1000 units tablet Take 1,000 Units by mouth daily.   . diphenoxylate-atropine (LOMOTIL) 2.5-0.025 MG tablet Take 1 tablet by mouth every 6 (six) hours as needed for diarrhea or loose stools.  Marland Kitchen ELIQUIS 5 MG TABS tablet TAKE 1 TABLET (5 MG TOTAL) BY MOUTH 2 (TWO) TIMES DAILY.  . furosemide (LASIX) 40 MG tablet Take 0.5 tablets (20 mg total) by mouth daily.  Marland Kitchen gabapentin (NEURONTIN) 100 MG capsule Take one to two at night as needed for "right ankle" pain.  . isosorbide mononitrate (IMDUR) 30 MG 24 hr tablet Take 1 tablet (30 mg total) by mouth daily.  Marland Kitchen lidocaine (LIDODERM) 5 % Place 1 patch onto the  skin daily as needed (for back pain). Remove & Discard patch within 12 hours or as directed by MD   . loperamide (IMODIUM) 2 MG capsule Take by mouth as needed for diarrhea or loose stools.  . Multiple Vitamin (MULTIVITAMIN) tablet Take 1 tablet by mouth daily.  . predniSONE (DELTASONE) 1 MG tablet TAKE 3 TABLETS BY MOUTH ONCE DAILY WITH BREAKFAST  . pyridostigmine (MESTINON) 60 MG tablet Take 60 mg by mouth daily.  . rosuvastatin (CRESTOR) 10 MG tablet Take 5 mg by mouth daily.  . traZODone (DESYREL) 100 MG tablet Take 1 tablet (100 mg total) by mouth at bedtime.     Allergies:   Other; Phenergan [promethazine hcl]; Zofran [ondansetron hcl]; and Sulfamethoxazole   Social History   Tobacco Use  . Smoking status: Never Smoker  . Smokeless tobacco: Never Used  Substance Use Topics  . Alcohol use: No    Alcohol/week: 0.0 standard drinks  . Drug use: No     Family Hx: The patient's family history includes Breast cancer in his sister; Clotting disorder in his sister; Dementia in his sister; Heart attack in his father, mother, sister, sister, and sister; Heart disease in his father, mother, and sister; Stroke in his father and mother. There is no history of Colon cancer.  ROS:   Please see the history of present illness.     All other systems reviewed and are negative.   Prior CV studies:   The following studies were reviewed today:  I reviewed most recent vascular studies with him.  Labs/Other Tests and Data Reviewed:    EKG:  No ECG reviewed.  Recent Labs: 09/27/2017: TSH 0.51 10/06/2017: Pro B Natriuretic peptide (BNP) 73.0 11/06/2017: Magnesium 2.0 02/11/2018: ALT 19 03/16/2018: BUN 8; Creatinine, Ser 1.22; Hemoglobin 9.7; Platelets 254; Potassium 4.5; Sodium 141   Recent Lipid Panel Lab Results  Component Value Date/Time   CHOL 134 07/20/2017 05:25 AM   TRIG 166 (H) 07/20/2017 05:25 AM   HDL 33 (L) 07/20/2017 05:25 AM   CHOLHDL 4.1 07/20/2017 05:25 AM   LDLCALC 68  07/20/2017 05:25 AM   LDLDIRECT 59.8 10/12/2012 07:37 AM    Wt Readings from Last 3 Encounters:  04/14/18 156 lb (70.8 kg)  03/16/18 155 lb (70.3 kg)  03/09/18 162 lb (73.5 kg)     Objective:    Vital Signs:  BP 140/77   Pulse 82   Ht 5\' 6"  (1.676 m)   BMI 25.18 kg/m    VITAL SIGNS:  reviewed GEN:  no acute distress EYES:  sclerae anicteric, EOMI - Extraocular Movements Intact RESPIRATORY:  normal respiratory effort, symmetric expansion CARDIOVASCULAR:  no peripheral edema SKIN:  no rash, lesions or ulcers. MUSCULOSKELETAL:  no obvious deformities. NEURO:  alert and oriented x 3, no obvious focal deficit PSYCH:  normal affect  ASSESSMENT & PLAN:     1.  Peripheral arterial disease :   Status post left SFA drug-coated balloon angioplasty and self-expanding stent placement .  He is status post right above-the-knee amputation for acute limb ischemia.  He now reports new symptoms of left foot soreness and numbness.  I am going to schedule him for an ABI and left lower extremity arterial duplex.  We have to make sure his left leg circulation stays patent to avoid any future amputation.  2. Coronary artery disease involving native coronary arteries without angina: Continue medical therapy.  3. CML: Currently on Gleevec.  4. Hyperlipidemia: Continue treatment with rosuvastatin with a target LDL of less than 70.  5.  Recurrent DVT/PE: On anticoagulation.    COVID-19 Education: The signs and symptoms of COVID-19 were discussed with the patient and how to seek care for testing (follow up with PCP or arrange E-visit).  The importance of social distancing was discussed today.  Time:   Today, I have spent 22 minutes with the patient with telehealth technology discussing the above problems.     Medication Adjustments/Labs and Tests Ordered: Current medicines are reviewed at length with the patient today.  Concerns regarding medicines are outlined above.   Tests Ordered:  No orders of the defined types were placed in this encounter.   Medication Changes: No orders of the defined types were placed in this encounter.   Disposition:  Follow up in 1 month(s)  Signed, [name], MD  06/07/2018 10:14 AM    [place]

## 2018-06-07 NOTE — Patient Instructions (Signed)
Medication Instructions:  Continue same medications If you need a refill on your cardiac medications before your next appointment, please call your pharmacy.   Lab work: None If you have labs (blood work) drawn today and your tests are completely normal, you will receive your results only by: Marland Kitchen MyChart Message (if you have MyChart) OR . A paper copy in the mail If you have any lab test that is abnormal or we need to change your treatment, we will call you to review the results.  Testing/Procedures: Schedule an ABI and lower extremity arterial duplex (check the left leg)  Follow-Up: Virtual visit with Dr. Fletcher Anon in 1 month

## 2018-06-07 NOTE — Addendum Note (Signed)
Addended by: Ricci Barker on: 06/07/2018 10:41 AM   Modules accepted: Orders

## 2018-06-08 ENCOUNTER — Encounter: Payer: Self-pay | Admitting: Physical Therapy

## 2018-06-08 ENCOUNTER — Encounter (HOSPITAL_COMMUNITY): Payer: Self-pay

## 2018-06-08 ENCOUNTER — Ambulatory Visit: Payer: Medicare Other | Admitting: Physical Therapy

## 2018-06-08 ENCOUNTER — Other Ambulatory Visit: Payer: Self-pay

## 2018-06-08 ENCOUNTER — Ambulatory Visit (HOSPITAL_COMMUNITY)
Admission: RE | Admit: 2018-06-08 | Discharge: 2018-06-08 | Disposition: A | Discharge status: Home / Self Care | Payer: Medicare Other | Source: Ambulatory Visit | Attending: Cardiovascular Disease | Admitting: Cardiovascular Disease

## 2018-06-08 DIAGNOSIS — I739 Peripheral vascular disease, unspecified: Secondary | ICD-10-CM | POA: Insufficient documentation

## 2018-06-08 DIAGNOSIS — R293 Abnormal posture: Secondary | ICD-10-CM | POA: Diagnosis not present

## 2018-06-08 DIAGNOSIS — Z9181 History of falling: Secondary | ICD-10-CM | POA: Diagnosis not present

## 2018-06-08 DIAGNOSIS — R2689 Other abnormalities of gait and mobility: Secondary | ICD-10-CM

## 2018-06-08 DIAGNOSIS — R2681 Unsteadiness on feet: Secondary | ICD-10-CM

## 2018-06-08 DIAGNOSIS — M6281 Muscle weakness (generalized): Secondary | ICD-10-CM

## 2018-06-08 NOTE — Patient Instructions (Signed)

## 2018-06-09 ENCOUNTER — Other Ambulatory Visit: Payer: Self-pay | Admitting: *Deleted

## 2018-06-09 NOTE — Patient Outreach (Signed)
[facility]) Care Management  06/09/2018  [name] [birth year] [id number]   [facility] screening call    Referral source: Insurance plan Referral reason : Tier 5, [employer] call to patient, explained reason for the call, HIPAA confirmed.   Screening completion :  Conditions R AKA 02/10/18, Hx PVD, left foot pain new. Reports that he is attending outpatient therapy for R AKA,and doing exercises at home. He had  recent visit to MD regarding left foot pain and follow up study done.  Diabetes  Controlled by diet and A1c on 1/22 5.6.  Social Patient lives at home with his wife that is able to assist. Patient reports having necessary medical equipment needs met.  Medications  Denies cost concerns, or difficulty managing.   Consent Explained and offered [facility] care management services for managing chronic conditions,explained follow up will be telephonically ,  patient denied states the is managing fine at this time. Emphasized no cost benefit of insurance plan. Patient agreeable to receiving information on program is new concerns come up.   Plan Will send successful outreach letter .  Joylene Draft, RN, [place] Management Coordinator  [phone]- Mobile [phone]- Toll Free Main Office

## 2018-06-09 NOTE — Therapy (Signed)
[facility] [address] [place], [name], [address] Phone: [phone]   Fax:  [phone]  Physical Therapy Treatment  Patient Details  Name: [name] MRN: [id number] Date of Birth: [birth year] Referring Provider (PT): Kathrynn Ducking, MD   Encounter Date: 06/08/2018  PT End of Session - 06/08/18 1801    Visit Number  2    Number of Visits  20    Date for PT Re-Evaluation  08/26/18    Authorization Type  Medicare & [employer]    PT Start Time  5670    PT Stop Time  1228    PT Time Calculation (min)  43 min    Equipment Utilized During Treatment  Gait belt    Activity Tolerance  Patient tolerated treatment well    Behavior During Therapy  [facility] for tasks assessed/performed       Past Medical History:  Diagnosis Date  . Bruises easily    d/t being on Eliquis  . Carotid artery disease ([place])    a. Duplex 05/2014: 14-10% RICA, 3-01% LICA, elevated velocities in right subclavian artery, normal left subclavian artery..  . Chronic back pain    HNP  . Chronic kidney disease (CKD)   . Claustrophobia    takes Ativan as needed  . 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 Coronary atherosclerosis of unspecified type of vessel, native or graft    a. cath in 2003 showed 10-20% LM, scattered 20% prox-mid LAD, 30% more distal LAD, 50-60% stenosis of LAD towards apex, 20% Cx, 30% dRCA, focal 95% stenosis in smaller of 2 branches of PDA, EF 60-65%.  . Diabetes mellitus ([place])   . Diarrhea    takes Imodium daily as needed  . Diverticulosis   . Diverticulosis of colon (without mention of hemorrhage)   . DJD (degenerative joint disease)   . DVT (deep venous thrombosis) ([place]) 07/2011  . Esophageal stricture   . Essential hypertension    takes Imdur and Lisinopril daily  . Gastric polyps    benign  . GERD (gastroesophageal reflux disease)    takes Nexium daily  . Glaucoma    uses eye drops  . History  of bronchitis    not sure when the last time  . History of colon polyps    benign  . History of kidney stones   . History of kidney stones   . Insomnia    takes Melatonin nightly as needed  . Mixed hyperlipidemia    takes Crestor daily  . Myasthenia gravis ([place])    uses prednisone  . Osteoporosis 2016  . Peripheral edema    takes Lasix daily  . Pituitary tumor    [facility] Dr[facility]Walden at [facility] checks it every Jan   . Pneumonia 01/2016  . PONV (postoperative nausea and vomiting)   . Ptosis   . Pulmonary embolus ([place]) 2012  . Stroke [facility]) 06/2015    Past Surgical History:  Procedure Laterality Date  . ABDOMINAL AORTOGRAM W/LOWER EXTREMITY N/A 11/18/2016   Procedure: ABDOMINAL AORTOGRAM W/LOWER EXTREMITY;  Surgeon: [name], MD;  Location: [place] [facility];  Service: Cardiovascular;  Laterality: N/A;  . AMPUTATION Right 02/07/2018   Procedure: right leg AMPUTATION ABOVE KNEE;  Surgeon: [name], MD;  Location: [place];  Service: Vascular;  Laterality: Right;  . AORTOGRAM Right 12/23/2017   Procedure: AORTOGRAM, RIGHT LOWER EXTREMITY ANGIOGRAM, RIGHT SUPERFICIAL FEMORAL ARTERY ANGIOPLASTY WITH STENT,  RIGHT POPLITEAL ARTERY ANGIOPLASTY WITH STENT, LEFT GROIN CUTDOWN AND LEFT FEMORAL ARTERY REPAIR;  Surgeon: [name], MD;  Location: [place];  Service: Vascular;  Laterality: Right;  . BACK SURGERY     x2, lumbar and cervical  . CARDIAC CATHETERIZATION    . CATARACT EXTRACTION Bilateral 12/12  . COLONOSCOPY    . ESOPHAGOGASTRODUODENOSCOPY ENDOSCOPY  04/2015  . IR GENERIC HISTORICAL  03/03/2016   IR IVC FILTER PLMT / S&I [name] GUID/MOD SED 03/03/2016 [name], MD [facility]INTER[facility] RAD  . IR GENERIC HISTORICAL  04/09/2016   IR RADIOLOGIST EVAL & MGMT 04/09/2016 [name], MD [facility] INTERV RAD  . IR GENERIC HISTORICAL  04/30/2016   IR IVC FILTER RETRIEVAL / S&I [name] GUID/MOD SED 04/30/2016 [name], MD [facility]-INTERV RAD  . LITHOTRIPSY    . LUMBAR  LAMINECTOMY/DECOMPRESSION MICRODISCECTOMY Right 09/14/2012   Procedure: Right Lumbar three-four, four-five, Lumbar five-Sacral one decompressive laminectomy;  Surgeon: [name], MD;  Location: [place] [facility]URO [facility];  Service: Neurosurgery;  Laterality: Right;  . LUMBAR LAMINECTOMY/DECOMPRESSION MICRODISCECTOMY Left 03/11/2016   Procedure: Left Lumbar Two-ThreeMicrodiscectomy;  Surgeon: [name], MD;  Location: [place];  Service: Neurosurgery;  Laterality: Left;  . PERIPHERAL VASCULAR INTERVENTION  11/18/2016   Procedure: PERIPHERAL VASCULAR INTERVENTION;  Surgeon: [name], MD;  Location: [place] [facility];  Service: Cardiovascular;;  Left popliteal  . SHOULDER SURGERY Left 05/07/2009  . TONSILLECTOMY      There were no vitals filed for this visit.  Subjective Assessment - 06/08/18 1149    Patient is accompained by:  Family member   Corliss Marcus, spouse   Pertinent History   Back surgery X 4; Tonsillectomy; Cataract extraction (Bilateral, 12/12); Shoulder surgery (Left, 05/07/2009); Cardiac catheterization; Lumbar laminectomy/decompression microdiscectomy (Right, 09/14/2012); Esophagogastroduodenoscopy endoscopy (04/2015); ir generic historical (03/03/2016); Colonoscopy; Lithotripsy; Lumbar laminectomy/decompression microdiscectomy (Left, 03/11/2016); ir generic historical (04/09/2016); ir generic historical (04/30/2016); ABDOMINAL AORTOGRAM W/LOWER EXTREMITY (N/A, 11/18/2016); PERIPHERAL VASCULAR INTERVENTION (11/18/2016); Aortogram (Right, 12/23/2017); and Transfemoral Amputation (Right, 02/07/2018).    Limitations  Lifting;Standing;Walking;House hold activities    Patient Stated Goals  He wants to walk with prosthesis, get around house & back in community. Try golf again.     Currently in Pain?  No/denies             [facility] Adult PT Treatment/Exercise - 06/08/18 1154      Transfers   Transfers  Sit to Stand;Stand to Sit    Sit to Stand  4: Min guard;With upper extremity assist;From  chair/3-in-1    Stand to Sit  4: Min guard;With upper extremity assist;To chair/3-in-1      Ambulation/Gait   Ambulation/Gait  Yes    Ambulation/Gait Assistance  4: Min assist;3: Mod assist    Ambulation/Gait Assistance Details  multimodal cues needed on sequencing, weight shifting over prosthesis and posture.  cues to increase base of support with gait as well.     Ambulation Distance (Feet)  16 Feet    Assistive device  Rolling walker;Prosthesis    Gait Pattern  Step-to pattern;Decreased step length - left;Decreased stance time - right;Decreased stride length;Decreased hip/knee flexion - right;Decreased weight shift to right;Right hip hike;Right circumduction;Antalgic;Lateral hip instability;Trunk flexed;Narrow base of support;Poor foot clearance - right    Ambulation Surface  Level;Indoor      Neuro Re-ed    Neuro Re-ed Details   educated on and had pt perform sink program for balance and proprioception.       Prosthetics   Current  prosthetic wear tolerance (days/week)   daily    Current prosthetic wear tolerance (#hours/day)   2 hours once a day, sometimes twice a day    Residual limb condition   intact with no issues    Education Provided  Residual limb care;Proper wear schedule/adjustment;Proper weight-bearing schedule/adjustment;Proper Donning    Person(s) Educated  Patient;Spouse    Education Method  Explanation;Demonstration;Verbal cues    Education Method  Verbalized understanding;Tactile cues required;Verbal cues required;Needs further instruction    Donning Prosthesis  Minimal assist             PT Education - 06/08/18 1218    Education Details  sink HEP for balance and proprioception    Person(s) Educated  Patient;Spouse    Methods  Explanation;Demonstration;Verbal cues;Tactile cues;Handout    Comprehension  Verbalized understanding;Returned demonstration;Need further instruction       PT Short Term Goals - 05/30/18 1155      PT SHORT TERM GOAL #1   Title   Patient demonstrates proper donning including tightening suspension strap. (All STGs Target Date: 06/30/2018)    Time  1    Period  Months    Status  New    Target Date  06/30/18      PT SHORT TERM GOAL #2   Title  Patient tolerates wear of prosthesis >8hrs total /day without skin or limb pain issues.     Time  1    Period  Months    Status  New    Target Date  06/30/18      PT SHORT TERM GOAL #3   Title  Patient reaches 5" anteriorly & to knee level with RW suppport with prosthesis with supervision & no loss of balance.     Time  1    Period  Months    Status  New    Target Date  06/30/18      PT SHORT TERM GOAL #4   Title  Patient ambulates 150' with RW & prosthesis with minA.     Time  1    Period  Months    Status  New    Target Date  06/30/18      PT SHORT TERM GOAL #5   Title  Patient demonstrates & verbalizes understanding of initial HEP.     Time  1    Period  Months    Status  New    Target Date  06/30/18        PT Long Term Goals - 05/30/18 1150      PT LONG TERM GOAL #1   Title  Patient & wife verbalize & demonstrate understanding of proper prosthetic care to enable safe use of prosthesis. (All LTGs Target Date: 08/26/2018)    Time  12    Period  Weeks    Status  New    Target Date  08/26/18      PT LONG TERM GOAL #2   Title  Patient tolerates wearing prosthesis >90% of awake hours without skin or limb pain issues to enable function throughout his day.     Time  12    Period  Weeks    Status  New    Target Date  08/26/18      PT LONG TERM GOAL #3   Title  Patient standing balance with RW support modified independent: reaching 10" anteriorly, pick up item from floor & scans environment.     Time  12    Period  Weeks    Status  New    Target Date  08/26/18      PT LONG TERM GOAL #4   Title  Patient ambulates 300' with RW & prosthesis modified independent to enable community mobility.     Time  12    Period  Weeks    Status  New    Target Date   08/26/18      PT LONG TERM GOAL #5   Title  Patient negotiates ramps & curbs with RW & prosthesis modified independent to enable community access.     Time  12    Period  Weeks    Status  New    Target Date  08/26/18            Plan - 06/08/18 1802    Clinical Impression Statement  Today's skilled session continued to focus on prosthetic education, iniitated the sink program for balance/proprioception and continued to address gait with RW/prosthesis. Pt with one episode of significant balance loss when he looked up too quickly needing mod assist to correct balance. Advised him and spouse not to walk at home just yet due to this. Both verbalized understanding and agreement. The pt is progressing toward goals and should benefit from continued PT to progress toward unmet goals.     Personal Factors and Comorbidities  Age;Comorbidity 3+;Fitness;Past/Current Experience    Comorbidities  Back surgery x 4; Tonsillectomy; Cataract extraction (Bilateral, 12/12); Shoulder surgery (Left, 05/07/2009); Cardiac catheterization; Lumbar laminectomy/decompression microdiscectomy (Right, 09/14/2012); Esophagogastroduodenoscopy endoscopy (04/2015); ir generic historical (03/03/2016); Colonoscopy; Lithotripsy; Lumbar laminectomy/decompression microdiscectomy (Left, 03/11/2016); ir generic historical (04/09/2016); ir generic historical (04/30/2016); ABDOMINAL AORTOGRAM W/LOWER EXTREMITY (N/A, 11/18/2016); PERIPHERAL VASCULAR INTERVENTION (11/18/2016); Aortogram (Right, 12/23/2017); and Amputation Transfemoral (Right, 02/07/2018).    Examination-Activity Limitations  Hygiene/Grooming;Lift;Locomotion Level;Reach Overhead;Stairs;Stand;Toileting;Transfers    Examination-Participation Restrictions  Church;Community Activity;Driving    Stability/Clinical Decision Making  Evolving/Moderate complexity    Rehab Potential  Good    PT Frequency  2x / week    PT Duration  12 weeks    PT Treatment/Interventions  ADLs/Self Care Home  Management;Stair training;Gait training;DME Instruction;Functional mobility training;Therapeutic activities;Therapeutic exercise;Balance training;Neuromuscular re-education;Patient/family education;Prosthetic Training;Manual techniques;Vestibular    PT Next Visit Plan  review prosthetic care, prosthetic gait with RW    Consulted and Agree with Plan of Care  Patient;Family member/caregiver    Family Member Consulted  wife, Halford Goetzke       Patient will benefit from skilled therapeutic intervention in order to improve the following deficits and impairments:  Abnormal gait, Decreased activity tolerance, Decreased balance, Decreased endurance, Decreased knowledge of use of DME, Decreased mobility, Decreased range of motion, Decreased strength, Impaired flexibility, Prosthetic Dependency, Postural dysfunction  Visit Diagnosis: Muscle weakness (generalized)  Abnormal posture  History of falling  Unsteadiness on feet  Other abnormalities of gait and mobility     Problem List Patient Active Problem List   Diagnosis Date Noted  . Insomnia due to medical condition 04/13/2018  . Atherosclerosis of native coronary artery of native heart without angina pectoris   . Hypokalemia   . Stage 3 chronic kidney disease ([place])   . 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 Acute blood loss anemia 02/08/2018  . Post-operative pain   . Unilateral AKA, right ([place])   . Ischemic foot 02/05/2018  . Ischemia of right lower extremity   . Pressure injury of skin 12/27/2017  . Arterial occlusive disease   . Postoperative pain   . CKD (chronic kidney disease), stage  III ([place])   . History of CVA (cerebrovascular accident)   . Femoral artery thrombosis, right ([place]) 12/23/2017  . Septic arthritis of left sternoclavicular joint ([place]) 11/03/2017  . Cervical spinal cord compression ([place]) 10/30/2017  . Nasal abscess 09/25/2017  . Immunocompromised ([place]) 09/25/2017  . Acute on chronic blood loss anemia 08/05/2017  .  Transient ischemic attack (TIA) 07/23/2017  . Subcortical infarction ([place])   . Right hemiparesis ([place])   . Ataxia 07/21/2017  . TIA (transient ischemic attack)   . Wheezing 06/17/2017  . Bursitis of left elbow 05/01/2017  . Dystonia 12/29/2016  . PAD (peripheral artery disease) ([place]) 11/18/2016  . Osteoporosis 08/28/2016  . Paronychia of great toe, left 08/10/2016  . Carotid arterial disease ([place]) 07/09/2016  . Hypocalcemia 07/09/2016  . Diplopia 07/02/2016  . S/P lumbar laminectomy 03/11/2016  . Radicular pain of left lower extremity 02/21/2016  . Cough 01/10/2016  . 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 Myasthenia gravis ([place])   . Diabetes mellitus type 2 in nonobese (HCC)   . Ataxia, post-stroke   . Gait disturbance, post-stroke   . Proximal leg weakness   . Cervical spondylosis without myelopathy   . Achilles tendinitis of right lower extremity   . Chronic diastolic CHF (congestive heart failure) ([place])   . Gastroesophageal reflux disease without esophagitis   . 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 Cerebellar stroke ([place])   . Ischemic stroke ([place])   . Cerebrovascular accident (CVA) due to thrombosis of left vertebral artery ([place])   . History of pulmonary embolus ([place])   . Chronic anticoagulation   . HLD (hyperlipidemia)   . Neck pain 03/19/2015  . Pain in the chest   . 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[place])   . Diabetic gastroparesis ([place]) 03/07/2015  . Screening for osteoporosis 07/19/2014  . Dyspnea 04/09/2014  . Edema 04/09/2014  . Routine general medical examination at a health care facility 10/23/2013  . Bilateral carotid bruits 05/24/2013  . Abnormal CT scan, lung 05/24/2013  . Encounter for therapeutic drug monitoring 03/14/2013  . Lymphadenopathy, inguinal 01/11/2013  . AKI (acute kidney injury) ([place]) 10/28/2012  . 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 Myasthenia  gravis with exacerbation ([place]) 03/31/2012  . Disease of salivary gland 12/09/2011  . Left facial pain 10/28/2011  . Pulmonary embolism ([place]) 10/25/2011  . 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 GERD with stricture 09/23/2011  . Long term (current) use of anticoagulants 08/03/2011  . History of pulmonary embolism 07/30/2011  . History of DVT (deep vein thrombosis) 07/30/2011  . Subconjunctival hemorrhage 06/22/2011  . Bilateral conjunctivitis 04/09/2011  . Diabetes mellitus type 2, controlled ([place]) 12/18/2010  . Hearing loss 08/04/2010  . Ptosis of eyelid 12/30/2009  . Essential hypertension 04/24/2009  . HYPERLIPIDEMIA, MIXED 10/05/2008  . HEMORRHOIDS-INTERNAL 07/02/2008  . HEMORRHOIDS-EXTERNAL 07/02/2008  . DIVERTICULOSIS-COLON 07/02/2008  . PERSONAL HX COLONIC POLYPS 07/02/2008  . RASH-NONVESICULAR 02/07/2008  . PNEUMONIA 12/08/2007  . Chronic myeloid leukemia in remission ([place]) 07/07/2007  . CAD (coronary artery disease) 07/07/2007  . NEPHROLITHIASIS, HX OF 07/07/2007    Willow Ora, PTA, [facility] [address] [place], [place] [address] [phone] 06/09/18, 6:06 PM   Name: [name] MRN: [id number] Date of Birth: 06/01/1940

## 2018-06-10 ENCOUNTER — Telehealth: Payer: Self-pay | Admitting: *Deleted

## 2018-06-10 DIAGNOSIS — I739 Peripheral vascular disease, unspecified: Secondary | ICD-10-CM

## 2018-06-10 NOTE — Telephone Encounter (Signed)
-----   Message from [name], MD sent at 06/10/2018  2:04 PM EDT ----- Stable circulation in the left leg with no significant change from before.  Repeat studies in 1 year.

## 2018-06-10 NOTE — Telephone Encounter (Signed)
Patient made aware of results and verbalized understanding. Repeat orders placed.    

## 2018-06-16 ENCOUNTER — Ambulatory Visit: Payer: Medicare Other | Attending: Neurology | Admitting: Physical Therapy

## 2018-06-16 ENCOUNTER — Encounter: Payer: Self-pay | Admitting: Physical Therapy

## 2018-06-16 ENCOUNTER — Other Ambulatory Visit: Payer: Self-pay

## 2018-06-16 DIAGNOSIS — R293 Abnormal posture: Secondary | ICD-10-CM | POA: Insufficient documentation

## 2018-06-16 DIAGNOSIS — R2681 Unsteadiness on feet: Secondary | ICD-10-CM

## 2018-06-16 DIAGNOSIS — M6281 Muscle weakness (generalized): Secondary | ICD-10-CM | POA: Insufficient documentation

## 2018-06-16 DIAGNOSIS — R2689 Other abnormalities of gait and mobility: Secondary | ICD-10-CM

## 2018-06-16 DIAGNOSIS — Z9181 History of falling: Secondary | ICD-10-CM | POA: Insufficient documentation

## 2018-06-16 NOTE — Therapy (Addendum)
[facility] [address] [place], [name], [address] Phone: [phone]   Fax:  [phone]  Physical Therapy Treatment  Patient Details  Name: [name] MRN: [id number] Date of Birth: July 24, 1940 Referring Provider (PT): Kathrynn Ducking, MD   Encounter Date: 06/16/2018  PT End of Session - 06/16/18 1243    Visit Number  3    Number of Visits  20    Date for PT Re-Evaluation  08/26/18    Authorization Type  Medicare & [employer]    PT Start Time  0922    PT Stop Time  1015    PT Time Calculation (min)  53 min    Equipment Utilized During Treatment  Gait belt    Activity Tolerance  Patient tolerated treatment well    Behavior During Therapy  [facility] for tasks assessed/performed       CLINIC OPERATION CHANGES: [facility] is operating at a low capacity due to COVID-19.  The patient was brought into the clinic for evaluation and/or treatment following universal masking by staff, social distancing, and <10 people in the clinic.  The patient's COVID risk of complications score is 9. Patient was seen in a private treatment room for greater social distancing.    Past Medical History:  Diagnosis Date  . Bruises easily    d/t being on Eliquis  . Carotid artery disease ([place])    a. Duplex 05/2014: 75-64% RICA, 3-32% LICA, elevated velocities in right subclavian artery, normal left subclavian artery..  . Chronic back pain    HNP  . Chronic kidney disease (CKD)   . Claustrophobia    takes Ativan as needed  . 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 Coronary atherosclerosis of unspecified type of vessel, native or graft    a. cath in 2003 showed 10-20% LM, scattered 20% prox-mid LAD, 30% more distal LAD, 50-60% stenosis of LAD towards apex, 20% Cx, 30% dRCA, focal 95% stenosis in smaller of 2 branches of PDA, EF 60-65%.  . Diabetes mellitus ([place])   . Diarrhea    takes Imodium  daily as needed  . Diverticulosis   . Diverticulosis of colon (without mention of hemorrhage)   . DJD (degenerative joint disease)   . DVT (deep venous thrombosis) ([place]) 07/2011  . Esophageal stricture   . Essential hypertension    takes Imdur and Lisinopril daily  . Gastric polyps    benign  . GERD (gastroesophageal reflux disease)    takes Nexium daily  . Glaucoma    uses eye drops  . History of bronchitis    not sure when the last time  . History of colon polyps    benign  . History of kidney stones   . History of kidney stones   . Insomnia    takes Melatonin nightly as needed  . Mixed hyperlipidemia    takes Crestor daily  . Myasthenia gravis ([place])    uses prednisone  . Osteoporosis 2016  . Peripheral edema    takes Lasix daily  . Pituitary tumor    checks Dr[facility]Walden at [facility] checks it every Jan   . Pneumonia 01/2016  . PONV (postoperative nausea and vomiting)   . Ptosis   . Pulmonary embolus ([place]) 2012  . Stroke [facility]) 06/2015    Past Surgical History:  Procedure Laterality Date  . ABDOMINAL AORTOGRAM W/LOWER EXTREMITY N/A 11/18/2016   Procedure: ABDOMINAL AORTOGRAM W/LOWER EXTREMITY;  Surgeon: [name], MD;  Location: [facility];  Service: Cardiovascular;  Laterality: N/A;  . AMPUTATION Right 02/07/2018   Procedure: right leg AMPUTATION ABOVE KNEE;  Surgeon: [name], MD;  Location: [facility];  Service: Vascular;  Laterality: Right;  . AORTOGRAM Right 12/23/2017   Procedure: AORTOGRAM, RIGHT LOWER EXTREMITY ANGIOGRAM, RIGHT SUPERFICIAL FEMORAL ARTERY ANGIOPLASTY WITH STENT, RIGHT POPLITEAL ARTERY ANGIOPLASTY WITH STENT, LEFT GROIN CUTDOWN AND LEFT FEMORAL ARTERY REPAIR;  Surgeon: [name], MD;  Location: [facility];  Service: Vascular;  Laterality: Right;  . BACK SURGERY     x2, lumbar and cervical  . CARDIAC CATHETERIZATION    . CATARACT EXTRACTION Bilateral 12/12  . COLONOSCOPY    . ESOPHAGOGASTRODUODENOSCOPY ENDOSCOPY   04/2015  . IR GENERIC HISTORICAL  03/03/2016   IR IVC FILTER PLMT / S&I [name] GUID/MOD SED 03/03/2016 [name], MD [facility]  . IR GENERIC HISTORICAL  04/09/2016   IR RADIOLOGIST EVAL & MGMT 04/09/2016 [name], MD [facility] INTER[facility] RAD  . IR GENERIC HISTORICAL  04/30/2016   IR IVC FILTER RETRIEVAL / S&I [name] GUID/MOD SED 04/30/2016 [name], MD [facility]-INTERV RAD  . LITHOTRIPSY    . LUMBAR LAMINECTOMY/DECOMPRESSION MICRODISCECTOMY Right 09/14/2012   Procedure: Right Lumbar three-four, four-five, Lumbar five-Sacral one decompressive laminectomy;  Surgeon: [name], MD;  Location: [facility];  Service: Neurosurgery;  Laterality: Right;  . LUMBAR LAMINECTOMY/DECOMPRESSION MICRODISCECTOMY Left 03/11/2016   Procedure: Left Lumbar Two-ThreeMicrodiscectomy;  Surgeon: [name], MD;  Location: [facility];  Service: Neurosurgery;  Laterality: Left;  . PERIPHERAL VASCULAR INTERVENTION  11/18/2016   Procedure: PERIPHERAL VASCULAR INTERVENTION;  Surgeon: [name], MD;  Location: [facility];  Service: Cardiovascular;;  Left popliteal  . SHOULDER SURGERY Left 05/07/2009  . TONSILLECTOMY      There were no vitals filed for this visit.  Subjective Assessment - 06/16/18 0930    Subjective  He has been wearing prosthesis 4hrs mostly 2x/day. No falls.    Patient is accompained by:  Family member   wife, [name]   Pertinent History   Back surgery X 4; Tonsillectomy; Cataract extraction (Bilateral, 12/12); Shoulder surgery (Left, 05/07/2009); Cardiac catheterization; Lumbar laminectomy/decompression microdiscectomy (Right, 09/14/2012); Esophagogastroduodenoscopy endoscopy (04/2015); ir generic historical (03/03/2016); Colonoscopy; Lithotripsy; Lumbar laminectomy/decompression microdiscectomy (Left, 03/11/2016); ir generic historical (04/09/2016); ir generic historical (04/30/2016); ABDOMINAL AORTOGRAM W/LOWER EXTREMITY (N/A, 11/18/2016); PERIPHERAL VASCULAR INTERVENTION (11/18/2016); Aortogram  (Right, 12/23/2017); and Transfemoral Amputation (Right, 02/07/2018).    Limitations  Lifting;Standing;Walking;House hold activities    Patient Stated Goals  He wants to walk with prosthesis, get around house & back in community. Try golf again.     Currently in Pain?  No/denies                       [facility] Adult PT Treatment/Exercise - 06/16/18 0925      Transfers   Transfers  Sit to Stand;Stand to Sit    Sit to Stand  5: Supervision;With upper extremity assist;With armrests;From chair/3-in-1   to RW   Sit to Stand Details  Verbal cues for technique    Stand to Sit  5: Supervision;With upper extremity assist;With armrests;To chair/3-in-1   from RW   Stand to Sit Details (indicate cue type and reason)  Verbal cues for technique;Visual cues/gestures for sequencing      Ambulation/Gait   Ambulation/Gait  Yes    Ambulation/Gait Assistance  4: Min assist;5: Supervision  Ambulation/Gait Assistance Details  demo, tactile & verbal cues on sequence, wt shift, initial contact prosthetic knee control & posture    Ambulation Distance (Feet)  45 Feet    Assistive device  Rolling walker;Prosthesis    Gait Pattern  Step-to pattern;Decreased step length - left;Decreased stance time - right;Decreased stride length;Decreased hip/knee flexion - right;Decreased weight shift to right;Right hip hike;Right circumduction;Antalgic;Lateral hip instability;Trunk flexed;Narrow base of support;Poor foot clearance - right    Ambulation Surface  Indoor;Level      Neuro Re-ed    Neuro Re-ed Details   standing balance with RW: reviewed HEP using RW - wt shifts lateral & ant/post with cues to use socket/limb interface proprioceptively, reaching laterally to across midline & anteriorly with wt shift and scanning right/left & up/down with wt shift       Prosthetics   Prosthetic Care Comments   tightening suspension strap if notes catching prosthetic foot with advancement. Reviewed proper donning.     Current prosthetic wear tolerance (days/week)   daily    Current prosthetic wear tolerance (#hours/day)   3hrs 2x/day    Residual limb condition   intact with no issues, normal moisture,     Education Provided  Residual limb care;Proper wear schedule/adjustment;Proper weight-bearing schedule/adjustment;Proper Donning    Person(s) Educated  Patient;Spouse    Education Method  Explanation;Demonstration;Verbal cues;Tactile cues    Education Method  Verbalized understanding;Returned demonstration;Tactile cues required;Verbal cues required;Needs further instruction    Donning Prosthesis  Minimal assist;Supervision               PT Short Term Goals - 06/16/18 2101      PT SHORT TERM GOAL #1   Title  Patient demonstrates proper donning including tightening suspension strap. (All STGs Target Date: 06/30/2018)    Time  1    Period  Months    Status  On-going    Target Date  06/30/18      PT SHORT TERM GOAL #2   Title  Patient tolerates wear of prosthesis >8hrs total /day without skin or limb pain issues.     Time  1    Period  Months    Status  On-going    Target Date  06/30/18      PT SHORT TERM GOAL #3   Title  Patient reaches 5" anteriorly & to knee level with RW suppport with prosthesis with supervision & no loss of balance.     Time  1    Period  Months    Status  On-going    Target Date  06/30/18      PT SHORT TERM GOAL #4   Title  Patient ambulates 150' with RW & prosthesis with minA.     Time  1    Period  Months    Status  On-going    Target Date  06/30/18      PT SHORT TERM GOAL #5   Title  Patient demonstrates & verbalizes understanding of initial HEP.     Time  1    Period  Months    Status  On-going    Target Date  06/30/18        PT Long Term Goals - 06/16/18 2101      PT LONG TERM GOAL #1   Title  Patient & wife verbalize & demonstrate understanding of proper prosthetic care to enable safe use of prosthesis. (All LTGs Target Date: 08/26/2018)    Time   12  Period  Weeks    Status  On-going      PT LONG TERM GOAL #2   Title  Patient tolerates wearing prosthesis >90% of awake hours without skin or limb pain issues to enable function throughout his day.     Time  12    Period  Weeks    Status  On-going      PT LONG TERM GOAL #3   Title  Patient standing balance with RW support modified independent: reaching 10" anteriorly, pick up item from floor & scans environment.     Time  12    Period  Weeks    Status  On-going      PT LONG TERM GOAL #4   Title  Patient ambulates 300' with RW & prosthesis modified independent to enable community mobility.     Time  12    Period  Weeks    Status  On-going      PT LONG TERM GOAL #5   Title  Patient negotiates ramps & curbs with RW & prosthesis modified independent to enable community access.     Time  12    Period  Weeks    Status  On-going            Plan - 06/16/18 2102    Clinical Impression Statement  Patient improved balance with skilled instruction in proper weight shift & posture. Patient improved prosthetic gait with improved knee control, wt shift & posture with skilled instructions.     Personal Factors and Comorbidities  Age;Comorbidity 3+;Fitness;Past/Current Experience    Comorbidities  Back surgery x 4; Tonsillectomy; Cataract extraction (Bilateral, 12/12); Shoulder surgery (Left, 05/07/2009); Cardiac catheterization; Lumbar laminectomy/decompression microdiscectomy (Right, 09/14/2012); Esophagogastroduodenoscopy endoscopy (04/2015); ir generic historical (03/03/2016); Colonoscopy; Lithotripsy; Lumbar laminectomy/decompression microdiscectomy (Left, 03/11/2016); ir generic historical (04/09/2016); ir generic historical (04/30/2016); ABDOMINAL AORTOGRAM W/LOWER EXTREMITY (N/A, 11/18/2016); PERIPHERAL VASCULAR INTERVENTION (11/18/2016); Aortogram (Right, 12/23/2017); and Amputation Transfemoral (Right, 02/07/2018).    Examination-Activity Limitations  Hygiene/Grooming;Lift;Locomotion  Level;Reach Overhead;Stairs;Stand;Toileting;Transfers    Examination-Participation Restrictions  Church;Community Activity;Driving    Stability/Clinical Decision Making  Evolving/Moderate complexity    Rehab Potential  Good    PT Frequency  2x / week    PT Duration  12 weeks    PT Treatment/Interventions  ADLs/Self Care Home Management;Stair training;Gait training;DME Instruction;Functional mobility training;Therapeutic activities;Therapeutic exercise;Balance training;Neuromuscular re-education;Patient/family education;Prosthetic Training;Manual techniques;Vestibular    PT Next Visit Plan  review prosthetic care, prosthetic gait with RW    Consulted and Agree with Plan of Care  Patient;Family member/caregiver    Family Member Consulted  wife, Tyronn Golda       Patient will benefit from skilled therapeutic intervention in order to improve the following deficits and impairments:  Abnormal gait, Decreased activity tolerance, Decreased balance, Decreased endurance, Decreased knowledge of use of DME, Decreased mobility, Decreased range of motion, Decreased strength, Impaired flexibility, Prosthetic Dependency, Postural dysfunction  Visit Diagnosis: Muscle weakness (generalized)  Abnormal posture  History of falling  Unsteadiness on feet  Other abnormalities of gait and mobility     Problem List Patient Active Problem List   Diagnosis Date Noted  . Insomnia due to medical condition 04/13/2018  . Atherosclerosis of native coronary artery of native heart without angina pectoris   . Hypokalemia   . Stage 3 chronic kidney disease ([place])   . 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 Acute blood loss anemia 02/08/2018  . Post-operative pain   . Unilateral AKA, right ([place])   . Ischemic foot  02/05/2018  . Ischemia of right lower extremity   . Pressure injury of skin 12/27/2017  . Arterial occlusive disease   . Postoperative pain   . CKD (chronic kidney disease), stage III ([place])   . History  of CVA (cerebrovascular accident)   . Femoral artery thrombosis, right ([place]) 12/23/2017  . Septic arthritis of left sternoclavicular joint ([place]) 11/03/2017  . Cervical spinal cord compression ([place]) 10/30/2017  . Nasal abscess 09/25/2017  . Immunocompromised ([place]) 09/25/2017  . Acute on chronic blood loss anemia 08/05/2017  . Transient ischemic attack (TIA) 07/23/2017  . Subcortical infarction ([place])   . Right hemiparesis ([place])   . Ataxia 07/21/2017  . TIA (transient ischemic attack)   . Wheezing 06/17/2017  . Bursitis of left elbow 05/01/2017  . Dystonia 12/29/2016  . PAD (peripheral artery disease) ([place]) 11/18/2016  . Osteoporosis 08/28/2016  . Paronychia of great toe, left 08/10/2016  . Carotid arterial disease ([place]) 07/09/2016  . Hypocalcemia 07/09/2016  . Diplopia 07/02/2016  . S/P lumbar laminectomy 03/11/2016  . Radicular pain of left lower extremity 02/21/2016  . Cough 01/10/2016  . 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 Myasthenia gravis ([place])   . Diabetes mellitus type 2 in nonobese ([facility])   . Ataxia, post-stroke   . Gait disturbance, post-stroke   . Proximal leg weakness   . Cervical spondylosis without myelopathy   . Achilles tendinitis of right lower extremity   . Chronic diastolic CHF (congestive heart failure) ([place])   . Gastroesophageal reflux disease without esophagitis   . 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 Cerebellar stroke ([place])   . Ischemic stroke ([place])   . Cerebrovascular accident (CVA) due to thrombosis of left vertebral artery ([place])   . History of pulmonary embolus ([place])   . Chronic anticoagulation   . HLD (hyperlipidemia)   . Neck pain 03/19/2015  . Pain in the chest   . 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[place])   . Diabetic gastroparesis ([place]) 03/07/2015  . Screening for osteoporosis 07/19/2014  . Dyspnea 04/09/2014  . Edema 04/09/2014  . Routine general medical  examination at a health care facility 10/23/2013  . Bilateral carotid bruits 05/24/2013  . Abnormal CT scan, lung 05/24/2013  . Encounter for therapeutic drug monitoring 03/14/2013  . Lymphadenopathy, inguinal 01/11/2013  . AKI (acute kidney injury) ([place]) 10/28/2012  . 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 Myasthenia gravis with exacerbation ([place]) 03/31/2012  . Disease of salivary gland 12/09/2011  . Left facial pain 10/28/2011  . Pulmonary embolism ([place]) 10/25/2011  . 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 GERD with stricture 09/23/2011  . Long term (current) use of anticoagulants 08/03/2011  . History of pulmonary embolism 07/30/2011  . History of DVT (deep vein thrombosis) 07/30/2011  . Subconjunctival hemorrhage 06/22/2011  . Bilateral conjunctivitis 04/09/2011  . Diabetes mellitus type 2, controlled ([place]) 12/18/2010  . Hearing loss 08/04/2010  . Ptosis of eyelid 12/30/2009  . Essential hypertension 04/24/2009  . HYPERLIPIDEMIA, MIXED 10/05/2008  . HEMORRHOIDS-INTERNAL 07/02/2008  . HEMORRHOIDS-EXTERNAL 07/02/2008  . DIVERTICULOSIS-COLON 07/02/2008  . PERSONAL HX COLONIC POLYPS 07/02/2008  . RASH-NONVESICULAR 02/07/2008  . PNEUMONIA 12/08/2007  . Chronic myeloid leukemia in remission ([place]) 07/07/2007  . CAD (coronary artery disease) 07/07/2007  . NEPHROLITHIASIS, HX OF 07/07/2007    Jamey Reas  PT, DPT 06/16/2018, 9:04 PM  [facility] [address] [place], [name], [address] Phone:  [phone]   Fax:  [phone]  Name: DONTREZ PETTIS MRN: [id number] Date of Birth: Dec 31, 1940

## 2018-06-23 ENCOUNTER — Other Ambulatory Visit: Payer: Self-pay

## 2018-06-23 ENCOUNTER — Encounter: Payer: Self-pay | Admitting: Physical Therapy

## 2018-06-23 ENCOUNTER — Other Ambulatory Visit: Payer: Self-pay | Admitting: Family Medicine

## 2018-06-23 ENCOUNTER — Ambulatory Visit: Payer: Medicare Other | Admitting: Physical Therapy

## 2018-06-23 ENCOUNTER — Telehealth: Payer: Self-pay

## 2018-06-23 DIAGNOSIS — R293 Abnormal posture: Secondary | ICD-10-CM | POA: Diagnosis not present

## 2018-06-23 DIAGNOSIS — M6281 Muscle weakness (generalized): Secondary | ICD-10-CM | POA: Diagnosis not present

## 2018-06-23 DIAGNOSIS — G4701 Insomnia due to medical condition: Secondary | ICD-10-CM

## 2018-06-23 DIAGNOSIS — R2681 Unsteadiness on feet: Secondary | ICD-10-CM

## 2018-06-23 DIAGNOSIS — R2689 Other abnormalities of gait and mobility: Secondary | ICD-10-CM | POA: Diagnosis not present

## 2018-06-23 DIAGNOSIS — Z9181 History of falling: Secondary | ICD-10-CM | POA: Diagnosis not present

## 2018-06-23 NOTE — Therapy (Addendum)
[facility] [address] [name], [name], [address] Phone: [phone]   Fax:  [phone]  Physical Therapy Treatment  Patient Details  Name: [name] MRN: [id number] Date of Birth: [birth year] Referring Provider (PT): Tristan Ducking, MD   Encounter Date: 06/23/2018  PT End of Session - 06/23/18 1116    Visit Number  4    Number of Visits  20    Date for PT Re-Evaluation  08/26/18    Authorization Type  Medicare & [employer]    PT Start Time  0930    PT Stop Time  1015    PT Time Calculation (min)  45 min    Equipment Utilized During Treatment  Gait belt    Activity Tolerance  Patient tolerated treatment well    Behavior During Therapy  [facility] for tasks assessed/performed       CLINIC OPERATION CHANGES: [facility] is operating at a low capacity due to COVID-19.  The patient was brought into the clinic for evaluation and/or treatment following universal masking by staff, social distancing, and <10 people in the clinic.  The patient's COVID risk of complications score is 9. Patient was seen in private treatment room for 50% of session & >10' distance from others in gym during gait training.     Past Medical History:  Diagnosis Date  . Bruises easily    d/t being on Eliquis  . Carotid artery disease ([name])    a. Duplex 05/2014: 70-62% RICA, 3-76% LICA, elevated velocities in right subclavian artery, normal left subclavian artery..  . Chronic back pain    HNP  . Chronic kidney disease (CKD)   . Claustrophobia    takes Ativan as needed  . 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 Coronary atherosclerosis of unspecified type of vessel, native or graft    a. cath in 2003 showed 10-20% LM, scattered 20% prox-mid LAD, 30% more distal LAD, 50-60% stenosis of LAD towards apex, 20% Cx, 30% dRCA, focal 95% stenosis in smaller of 2 branches of PDA, EF 60-65%.  . Diabetes  mellitus ([place])   . Diarrhea    takes Imodium daily as needed  . Diverticulosis   . Diverticulosis of colon (without mention of hemorrhage)   . DJD (degenerative joint disease)   . DVT (deep venous thrombosis) ([place]) 07/2011  . Esophageal stricture   . Essential hypertension    takes Imdur and Lisinopril daily  . Gastric polyps    benign  . GERD (gastroesophageal reflux disease)    takes Nexium daily  . Glaucoma    uses eye drops  . History of bronchitis    not sure when the last time  . History of colon polyps    benign  . History of kidney stones   . History of kidney stones   . Insomnia    takes Melatonin nightly as needed  . Mixed hyperlipidemia    takes Crestor daily  . Myasthenia gravis ([name])    uses prednisone  . Osteoporosis 2016  . Peripheral edema    takes Lasix daily  . Pituitary tumor    checks Dr.Walden at [name] checks it every Jan   . Pneumonia 01/2016  . PONV (postoperative nausea and vomiting)   . Ptosis   . Pulmonary embolus ([name]) 2012  . Stroke [facility]) 06/2015    Past Surgical History:  Procedure Laterality Date  . ABDOMINAL AORTOGRAM W/LOWER EXTREMITY  N/A 11/18/2016   Procedure: ABDOMINAL AORTOGRAM W/LOWER EXTREMITY;  Surgeon: [name], MD;  Location: [facility];  Service: Cardiovascular;  Laterality: N/A;  . AMPUTATION Right 02/07/2018   Procedure: right leg AMPUTATION ABOVE KNEE;  Surgeon: [name], MD;  Location: [facility];  Service: Vascular;  Laterality: Right;  . AORTOGRAM Right 12/23/2017   Procedure: AORTOGRAM, RIGHT LOWER EXTREMITY ANGIOGRAM, RIGHT SUPERFICIAL FEMORAL ARTERY ANGIOPLASTY WITH STENT, RIGHT POPLITEAL ARTERY ANGIOPLASTY WITH STENT, LEFT GROIN CUTDOWN AND LEFT FEMORAL ARTERY REPAIR;  Surgeon: [name], MD;  Location: [facility];  Service: Vascular;  Laterality: Right;  . BACK SURGERY     x2, lumbar and cervical  . CARDIAC CATHETERIZATION    . CATARACT EXTRACTION Bilateral 12/12  . COLONOSCOPY     . ESOPHAGOGASTRODUODENOSCOPY ENDOSCOPY  04/2015  . IR GENERIC HISTORICAL  03/03/2016   IR IVC FILTER PLMT / S&I [name] GUID/MOD SED 03/03/2016 [name], MD [facility]  . IR GENERIC HISTORICAL  04/09/2016   IR RADIOLOGIST EVAL & MGMT 04/09/2016 [name], MD [facility] INTER[facility]  . IR GENERIC HISTORICAL  04/30/2016   IR IVC FILTER RETRIEVAL / S&I [name] GUID/MOD SED 04/30/2016 [name], MD [facility]  . LITHOTRIPSY    . LUMBAR LAMINECTOMY/DECOMPRESSION MICRODISCECTOMY Right 09/14/2012   Procedure: Right Lumbar three-four, four-five, Lumbar five-Sacral one decompressive laminectomy;  Surgeon: [name], MD;  Location: [facility];  Service: Neurosurgery;  Laterality: Right;  . LUMBAR LAMINECTOMY/DECOMPRESSION MICRODISCECTOMY Left 03/11/2016   Procedure: Left Lumbar Two-ThreeMicrodiscectomy;  Surgeon: [name], MD;  Location: [facility];  Service: Neurosurgery;  Laterality: Left;  . PERIPHERAL VASCULAR INTERVENTION  11/18/2016   Procedure: PERIPHERAL VASCULAR INTERVENTION;  Surgeon: [name], MD;  Location: [facility];  Service: Cardiovascular;;  Left popliteal  . SHOULDER SURGERY Left 05/07/2009  . TONSILLECTOMY      There were no vitals filed for this visit.  Subjective Assessment - 06/23/18 0930    Subjective  He has been wearing prosthesis 1.5 hrs 2x/day.  He has not been able to walk as son has not been there. No falls.     Patient is accompained by:  Family member   wife, [name]   Pertinent History   Back surgery X 4; Tonsillectomy; Cataract extraction (Bilateral, 12/12); Shoulder surgery (Left, 05/07/2009); Cardiac catheterization; Lumbar laminectomy/decompression microdiscectomy (Right, 09/14/2012); Esophagogastroduodenoscopy endoscopy (04/2015); ir generic historical (03/03/2016); Colonoscopy; Lithotripsy; Lumbar laminectomy/decompression microdiscectomy (Left, 03/11/2016); ir generic historical (04/09/2016); ir generic historical (04/30/2016); ABDOMINAL  AORTOGRAM W/LOWER EXTREMITY (N/A, 11/18/2016); PERIPHERAL VASCULAR INTERVENTION (11/18/2016); Aortogram (Right, 12/23/2017); and Transfemoral Amputation (Right, 02/07/2018).    Limitations  Lifting;Standing;Walking;House hold activities    Patient Stated Goals  He wants to walk with prosthesis, get around house & back in community. Try golf again.     Currently in Pain?  Yes    Pain Score  5     Pain Location  Hip    Pain Orientation  Left    Pain Descriptors / Indicators  Sore    Pain Type  Chronic pain    Pain Onset  More than a month ago    Pain Frequency  Intermittent    Aggravating Factors   standing    Pain Relieving Factors  sitting down                       [facility] Adult PT Treatment/Exercise - 06/23/18 0930  Transfers   Transfers  Sit to Stand;Stand to [employer] Transfers    Sit to Stand  5: Supervision;With upper extremity assist;With armrests;From chair/3-in-1;4: Min assist   to [place], supervision chairs with [place] no armrests   Sit to Stand Details  Verbal cues for technique;Visual cues/gestures for sequencing    Sit to Stand Details (indicate cue type and reason)  demo technique with chairs without armrests using UE to push     Stand to Sit  5: Supervision;With upper extremity assist;With armrests;To chair/3-in-1;4: Min guard   from RW, min guard chairs without armrests   Stand to Sit Details (indicate cue type and reason)  Verbal cues for technique;Visual cues/gestures for sequencing;Visual cues for safe use of DME/AE    Stand to Sit Details  demo technique with chairs without armrests using UE to lower    Stand Pivot Transfers  5: Supervision   RW & TFA prosthesis     Ambulation/Gait   Ambulation/Gait  Yes    Ambulation/Gait Assistance  4: Min assist;5: Supervision    Ambulation/Gait Assistance Details  verbal & tactile cues on initial contact knee control, wt shift & sequencing.   PT instructed wife how to assist ambulating between 2  chairs with armrests placed 20'-25' apart.     Ambulation Distance (Feet)  25 Feet   25' X 3   Assistive device  Rolling walker;Prosthesis    Gait Pattern  Step-to pattern;Decreased step length - left;Decreased stance time - right;Decreased stride length;Decreased hip/knee flexion - right;Decreased weight shift to right;Right hip hike;Right circumduction;Antalgic;Lateral hip instability;Trunk flexed;Narrow base of support;Poor foot clearance - right    Ambulation Surface  Indoor;Level      Neuro Re-ed    Neuro Re-ed Details   --      Prosthetics   Prosthetic Care Comments   reviewed donning & adjusting ply socks to decrease proximal medial socket pressure.  PT instructed in sitting posture using towel or pillow to level pelvis & foot stool to position knee even with hip.  PT also demo/instructed in using pillow to support left leg in right sidelying.  Patient reports both sitting & sidelying cues should improve his left hip pain.      Current prosthetic wear tolerance (days/week)   daily    Current prosthetic wear tolerance (#hours/day)   1.5 hrs 2x/day pt limiting due to hip pain (PT addressed), PT cued to wear 4-5 hrs 2x/day is goal.     Current prosthetic weight-bearing tolerance (hours/day)   no pain with standing & gait today    Residual limb condition   intact with no issues, normal moisture,     Education Provided  Residual limb care;Proper wear schedule/adjustment;Proper weight-bearing schedule/adjustment;Proper Donning;Other (comment);Correct ply sock adjustment   see prosthetic care comments   Person(s) Educated  Patient;Spouse    Education Method  Explanation;Demonstration;Tactile cues;Verbal cues    Education Method  Verbalized understanding;Needs further instruction    Donning Prosthesis  Supervision;Minimal assist   minA with completely inverting liner            PT Education - 06/23/18 1114    Education Details  assisting gait between 2 chairs with armrests placed  20-25' apart    Person(s) Educated  Patient;Spouse    Methods  Explanation;Demonstration;Verbal cues    Comprehension  Verbalized understanding;Returned demonstration;Need further instruction       PT Short Term Goals - 06/16/18 2101      PT SHORT TERM GOAL #1  Title  Patient demonstrates proper donning including tightening suspension strap. (All STGs Target Date: 06/30/2018)    Time  1    Period  Months    Status  On-going    Target Date  06/30/18      PT SHORT TERM GOAL #2   Title  Patient tolerates wear of prosthesis >8hrs total /day without skin or limb pain issues.     Time  1    Period  Months    Status  On-going    Target Date  06/30/18      PT SHORT TERM GOAL #3   Title  Patient reaches 5" anteriorly & to knee level with RW suppport with prosthesis with supervision & no loss of balance.     Time  1    Period  Months    Status  On-going    Target Date  06/30/18      PT SHORT TERM GOAL #4   Title  Patient ambulates 150' with RW & prosthesis with minA.     Time  1    Period  Months    Status  On-going    Target Date  06/30/18      PT SHORT TERM GOAL #5   Title  Patient demonstrates & verbalizes understanding of initial HEP.     Time  1    Period  Months    Status  On-going    Target Date  06/30/18        PT Long Term Goals - 06/16/18 2101      PT LONG TERM GOAL #1   Title  Patient & wife verbalize & demonstrate understanding of proper prosthetic care to enable safe use of prosthesis. (All LTGs Target Date: 08/26/2018)    Time  12    Period  Weeks    Status  On-going      PT LONG TERM GOAL #2   Title  Patient tolerates wearing prosthesis >90% of awake hours without skin or limb pain issues to enable function throughout his day.     Time  12    Period  Weeks    Status  On-going      PT LONG TERM GOAL #3   Title  Patient standing balance with RW support modified independent: reaching 10" anteriorly, pick up item from floor & scans environment.     Time   12    Period  Weeks    Status  On-going      PT LONG TERM GOAL #4   Title  Patient ambulates 300' with RW & prosthesis modified independent to enable community mobility.     Time  12    Period  Weeks    Status  On-going      PT LONG TERM GOAL #5   Title  Patient negotiates ramps & curbs with RW & prosthesis modified independent to enable community access.     Time  12    Period  Weeks    Status  On-going            Plan - 06/23/18 1116    Clinical Impression Statement  Patient & wife appear safe to ambulate with RW & prosthesis short distances between 2 chairs. Progress towards STGs is slowed by reduced frequency with COVID limiting number of individuals in clinic.     Personal Factors and Comorbidities  Age;Comorbidity 3+;Fitness;Past/Current Experience    Comorbidities  Back surgery x 4; Tonsillectomy; Cataract extraction (Bilateral, 12/12); Shoulder  surgery (Left, 05/07/2009); Cardiac catheterization; Lumbar laminectomy/decompression microdiscectomy (Right, 09/14/2012); Esophagogastroduodenoscopy endoscopy (04/2015); ir generic historical (03/03/2016); Colonoscopy; Lithotripsy; Lumbar laminectomy/decompression microdiscectomy (Left, 03/11/2016); ir generic historical (04/09/2016); ir generic historical (04/30/2016); ABDOMINAL AORTOGRAM W/LOWER EXTREMITY (N/A, 11/18/2016); PERIPHERAL VASCULAR INTERVENTION (11/18/2016); Aortogram (Right, 12/23/2017); and Amputation Transfemoral (Right, 02/07/2018).    Examination-Activity Limitations  Hygiene/Grooming;Lift;Locomotion Level;Reach Overhead;Stairs;Stand;Toileting;Transfers    Examination-Participation Restrictions  Church;Community Activity;Driving    Stability/Clinical Decision Making  Evolving/Moderate complexity    Rehab Potential  Good    PT Frequency  2x / week    PT Duration  12 weeks    PT Treatment/Interventions  ADLs/Self Care Home Management;Stair training;Gait training;DME Instruction;Functional mobility training;Therapeutic  activities;Therapeutic exercise;Balance training;Neuromuscular re-education;Patient/family education;Prosthetic Training;Manual techniques;Vestibular    PT Next Visit Plan  check STGs, in June will increase frequency to 2x/wk    Consulted and Agree with Plan of Care  Patient;Family member/caregiver    Family Member Consulted  wife, Selassie Spatafore       Patient will benefit from skilled therapeutic intervention in order to improve the following deficits and impairments:  Abnormal gait, Decreased activity tolerance, Decreased balance, Decreased endurance, Decreased knowledge of use of DME, Decreased mobility, Decreased range of motion, Decreased strength, Impaired flexibility, Prosthetic Dependency, Postural dysfunction  Visit Diagnosis: Muscle weakness (generalized)  Abnormal posture  Unsteadiness on feet  Other abnormalities of gait and mobility     Problem List Patient Active Problem List   Diagnosis Date Noted  . Insomnia due to medical condition 04/13/2018  . Atherosclerosis of native coronary artery of native heart without angina pectoris   . Hypokalemia   . Stage 3 chronic kidney disease ([place])   . 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 Acute blood loss anemia 02/08/2018  . Post-operative pain   . Unilateral AKA, right ([place])   . Ischemic foot 02/05/2018  . Ischemia of right lower extremity   . Pressure injury of skin 12/27/2017  . Arterial occlusive disease   . Postoperative pain   . CKD (chronic kidney disease), stage III ([place])   . History of CVA (cerebrovascular accident)   . Femoral artery thrombosis, right ([place]) 12/23/2017  . Septic arthritis of left sternoclavicular joint ([place]) 11/03/2017  . Cervical spinal cord compression ([place]) 10/30/2017  . Nasal abscess 09/25/2017  . Immunocompromised ([place]) 09/25/2017  . Acute on chronic blood loss anemia 08/05/2017  . Transient ischemic attack (TIA) 07/23/2017  . Subcortical infarction ([place])   . Right hemiparesis ([place])   .  Ataxia 07/21/2017  . TIA (transient ischemic attack)   . Wheezing 06/17/2017  . Bursitis of left elbow 05/01/2017  . Dystonia 12/29/2016  . PAD (peripheral artery disease) ([place]) 11/18/2016  . Osteoporosis 08/28/2016  . Paronychia of great toe, left 08/10/2016  . Carotid arterial disease ([place]) 07/09/2016  . Hypocalcemia 07/09/2016  . Diplopia 07/02/2016  . S/P lumbar laminectomy 03/11/2016  . Radicular pain of left lower extremity 02/21/2016  . Cough 01/10/2016  . 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 Myasthenia gravis ([place])   . Diabetes mellitus type 2 in nonobese (HCC)   . Ataxia, post-stroke   . Gait disturbance, post-stroke   . Proximal leg weakness   . Cervical spondylosis without myelopathy   . Achilles tendinitis of right lower extremity   . Chronic diastolic CHF (congestive heart failure) ([place])   . Gastroesophageal reflux disease without esophagitis   . 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 Cerebellar stroke ([place])   .  Ischemic stroke ([place])   . Cerebrovascular accident (CVA) due to thrombosis of left vertebral artery ([place])   . History of pulmonary embolus (PE)   . Chronic anticoagulation   . HLD (hyperlipidemia)   . Neck pain 03/19/2015  . Pain in the chest   . 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[place])   . Diabetic gastroparesis ([place]) 03/07/2015  . Screening for osteoporosis 07/19/2014  . Dyspnea 04/09/2014  . Edema 04/09/2014  . Routine general medical examination at a health care facility 10/23/2013  . Bilateral carotid bruits 05/24/2013  . Abnormal CT scan, lung 05/24/2013  . Encounter for therapeutic drug monitoring 03/14/2013  . Lymphadenopathy, inguinal 01/11/2013  . AKI (acute kidney injury) ([place]) 10/28/2012  . 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 Myasthenia gravis with exacerbation ([place]) 03/31/2012  . Disease of salivary gland 12/09/2011  . Left facial pain  10/28/2011  . Pulmonary embolism ([place]) 10/25/2011  . 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 GERD with stricture 09/23/2011  . Long term (current) use of anticoagulants 08/03/2011  . History of pulmonary embolism 07/30/2011  . History of DVT (deep vein thrombosis) 07/30/2011  . Subconjunctival hemorrhage 06/22/2011  . Bilateral conjunctivitis 04/09/2011  . Diabetes mellitus type 2, controlled ([place]) 12/18/2010  . Hearing loss 08/04/2010  . Ptosis of eyelid 12/30/2009  . Essential hypertension 04/24/2009  . HYPERLIPIDEMIA, MIXED 10/05/2008  . HEMORRHOIDS-INTERNAL 07/02/2008  . HEMORRHOIDS-EXTERNAL 07/02/2008  . DIVERTICULOSIS-COLON 07/02/2008  . PERSONAL HX COLONIC POLYPS 07/02/2008  . RASH-NONVESICULAR 02/07/2008  . PNEUMONIA 12/08/2007  . Chronic myeloid leukemia in remission ([place]) 07/07/2007  . CAD (coronary artery disease) 07/07/2007  . NEPHROLITHIASIS, HX OF 07/07/2007    Jamey Reas PT, DPT 06/23/2018, 11:19 AM  [facility] [address] [place], [name], [address] Phone: [phone]   Fax:  [phone]  Name: [name] MRN: [id number] Date of Birth: Nov 13, 1940

## 2018-06-23 NOTE — Progress Notes (Signed)
Pt called and requested letter be sent to the [facility] letting them know that Dr. Havery Moros prescribed Lomotil at his visit in 05-2018. Letter faxed to [employer] at [phone].

## 2018-06-23 NOTE — Telephone Encounter (Signed)
Patient called today requesting a refill of trazodone medication.  Requested that it be reordered and sent to the [facility] in [place] instead of the local pharmacy.  In order for this to occur we will need to reorder the medication in a printed form and it, along with the most current doctors note and original referral to our clinic, will have to be faxed over to the [facility] attn to [place] F# [phone].

## 2018-06-24 MED ORDER — TRAZODONE HCL 100 MG PO TABS: 100.0000 mg | ORAL_TABLET | Freq: Every day | ORAL | 2 refills | Status: DC

## 2018-06-24 NOTE — Telephone Encounter (Signed)
Someone will have to write an order for it and fax it as I won't be in office until Tuesday, thanks

## 2018-06-24 NOTE — Telephone Encounter (Signed)
Consulted with Zella Ball about medication and refill, she has agreed and signed a new prescription for patient.  Faxed all information to the [facility]

## 2018-06-27 ENCOUNTER — Ambulatory Visit (INDEPENDENT_AMBULATORY_CARE_PROVIDER_SITE_OTHER): Payer: Medicare Other | Admitting: Family Medicine

## 2018-06-27 ENCOUNTER — Telehealth: Payer: Self-pay | Admitting: Gastroenterology

## 2018-06-27 ENCOUNTER — Encounter: Payer: Self-pay | Admitting: Family Medicine

## 2018-06-27 DIAGNOSIS — M792 Neuralgia and neuritis, unspecified: Secondary | ICD-10-CM | POA: Diagnosis not present

## 2018-06-27 MED ORDER — GABAPENTIN 100 MG PO CAPS: ORAL_CAPSULE | ORAL | 1 refills | Status: DC

## 2018-06-27 NOTE — Telephone Encounter (Signed)
Pt is requesting the a copy of last ov faxed over to the [facility]. Fax # is [phone], Attn. Evangeline Gula. Pt stated that last time the doctor's nurse helped him with this because her could not reach medical records.

## 2018-06-27 NOTE — Progress Notes (Signed)
Virtual Visit via Video Note  I connected with [name] on 06/27/18 at  3:00 PM EDT by a video enabled telemedicine application and verified that I am speaking with the correct person using two identifiers.  Location: Patient: home Provider:    I discussed the limitations of evaluation and management by telemedicine and the availability of in person appointments. The patient expressed understanding and agreed to proceed.  History of Present Illness:    Observations/Objective:   Assessment and Plan:   Follow Up Instructions:    I discussed the assessment and treatment plan with the patient. The patient was provided an opportunity to ask questions and all were answered. The patient agreed with the plan and demonstrated an understanding of the instructions.   The patient was advised to call back or seek an in-person evaluation if the symptoms worsen or if the condition fails to improve as anticipated.  I provided 15   Established Patient Office Visit  Subjective:  Patient ID: [name], male    DOB: Jul 06, 1940  Age: 78 y.o. MRN: [id number]  CC:  Chief Complaint  Patient presents with  . left leg numbness    HPI [name] presents for evaluation of persisting numbness in his anterior leg into his foot.  This is an ongoing problem of several months duration.  History of arthritis in his lower back but denies recent injury or back pain.  Patient has no history of diabetes recent hemoglobin A1c was 5.6.  There is some existing peripheral vascular disease in his right leg but he tells me that the vascular surgeon says that arterial supply is stable and asked for follow-up in 1 year.  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  He has done well.  Continues to work with physical therapy.  He has some swelling in his right ankle seems to be worse towards the end of the day.  Denies any chest pain shortness of breath.  Sleeps on 2 pillows which is usual for him.  Past  Medical History:  Diagnosis Date  . Bruises easily    d/t being on Eliquis  . Carotid artery disease ([place])    a. Duplex 05/2014: 54-62% RICA, 7-03% LICA, elevated velocities in right subclavian artery, normal left subclavian artery..  . Chronic back pain    HNP  . Chronic kidney disease (CKD)   . Claustrophobia    takes Ativan as needed  . 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 Coronary atherosclerosis of unspecified type of vessel, native or graft    a. cath in 2003 showed 10-20% LM, scattered 20% prox-mid LAD, 30% more distal LAD, 50-60% stenosis of LAD towards apex, 20% Cx, 30% dRCA, focal 95% stenosis in smaller of 2 branches of PDA, EF 60-65%.  . Diabetes mellitus ([name])   . Diarrhea    takes Imodium daily as needed  . Diverticulosis   . Diverticulosis of colon (without mention of hemorrhage)   . DJD (degenerative joint disease)   . DVT (deep venous thrombosis) ([place]) 07/2011  . Esophageal stricture   . Essential hypertension    takes Imdur and Lisinopril daily  . Gastric polyps    benign  . GERD (gastroesophageal reflux disease)    takes Nexium daily  . Glaucoma    uses eye drops  . History of bronchitis    not sure when the last time  . History of colon polyps  benign  . History of kidney stones   . History of kidney stones   . Insomnia    takes Melatonin nightly as needed  . Mixed hyperlipidemia    takes Crestor daily  . Myasthenia gravis ([name])    uses prednisone  . Osteoporosis 2016  . Peripheral edema    takes Lasix daily  . Pituitary tumor    checks Dr[facility]Walden at [facility] checks it every Jan   . Pneumonia 01/2016  . PONV (postoperative nausea and vomiting)   . Ptosis   . Pulmonary embolus ([name]) 2012  . Stroke [name]) 06/2015    Past Surgical History:  Procedure Laterality Date  . ABDOMINAL AORTOGRAM W/LOWER EXTREMITY N/A 11/18/2016   Procedure: ABDOMINAL AORTOGRAM W/LOWER EXTREMITY;  Surgeon: [name], MD;  Location: [place] [facility];  Service: Cardiovascular;  Laterality: N/A;  . AMPUTATION Right 02/07/2018   Procedure: right leg AMPUTATION ABOVE KNEE;  Surgeon: [name], MD;  Location: [place];  Service: Vascular;  Laterality: Right;  . AORTOGRAM Right 12/23/2017   Procedure: AORTOGRAM, RIGHT LOWER EXTREMITY ANGIOGRAM, RIGHT SUPERFICIAL FEMORAL ARTERY ANGIOPLASTY WITH STENT, RIGHT POPLITEAL ARTERY ANGIOPLASTY WITH STENT, LEFT GROIN CUTDOWN AND LEFT FEMORAL ARTERY REPAIR;  Surgeon: [name], MD;  Location: [place];  Service: Vascular;  Laterality: Right;  . BACK SURGERY     x2, lumbar and cervical  . CARDIAC CATHETERIZATION    . CATARACT EXTRACTION Bilateral 12/12  . COLONOSCOPY    . ESOPHAGOGASTRODUODENOSCOPY ENDOSCOPY  04/2015  . IR GENERIC HISTORICAL  03/03/2016   IR IVC FILTER PLMT / S&I [name] GUID/MOD SED 03/03/2016 [name], MD [facility]  . IR GENERIC HISTORICAL  04/09/2016   IR RADIOLOGIST EVAL & MGMT 04/09/2016 [name], MD [facility]  . IR GENERIC HISTORICAL  04/30/2016   IR IVC FILTER RETRIEVAL / S&I [name] GUID/MOD SED 04/30/2016 [name], MD [facility]  . LITHOTRIPSY    . LUMBAR LAMINECTOMY/DECOMPRESSION MICRODISCECTOMY Right 09/14/2012   Procedure: Right Lumbar three-four, four-five, Lumbar five-Sacral one decompressive laminectomy;  Surgeon: [name], MD;  Location: [place] [facility];  Service: Neurosurgery;  Laterality: Right;  . LUMBAR LAMINECTOMY/DECOMPRESSION MICRODISCECTOMY Left 03/11/2016   Procedure: Left Lumbar Two-ThreeMicrodiscectomy;  Surgeon: [name], MD;  Location: [place];  Service: Neurosurgery;  Laterality: Left;  . PERIPHERAL VASCULAR INTERVENTION  11/18/2016   Procedure: PERIPHERAL VASCULAR INTERVENTION;  Surgeon: [name], MD;  Location: [place] [facility];  Service: Cardiovascular;;  Left popliteal  . SHOULDER SURGERY Left 05/07/2009  . TONSILLECTOMY      Family History  Problem Relation Age of Onset  . Heart disease  Mother   . Heart attack Mother   . Stroke Mother   . Heart disease Father   . Heart attack Father   . Stroke Father   . Breast cancer Sister        Twin   . Heart attack Sister   . Dementia Sister   . Heart disease Sister   . Heart attack Sister   . Clotting disorder Sister   . Heart attack Sister   . Colon cancer Neg Hx     Social History   Socioeconomic History  . Marital status: Married    Spouse name: Hassan Rowan  . Number of children: 2  . Years of education: 1-College  . Highest education level: Not on file  Occupational History  . Occupation: retired    Fish farm manager: RETIRED    Comment:  Retired  Scientific laboratory technician  . Financial resource strain: Not on file  . Food insecurity:    Worry: Not on file    Inability: Not on file  . Transportation needs:    Medical: Not on file    Non-medical: Not on file  Tobacco Use  . Smoking status: Never Smoker  . Smokeless tobacco: Never Used  Substance and Sexual Activity  . Alcohol use: No    Alcohol/week: 0.0 standard drinks  . Drug use: No  . Sexual activity: Not Currently  Lifestyle  . Physical activity:    Days per week: Not on file    Minutes per session: Not on file  . Stress: Not on file  Relationships  . Social connections:    Talks on phone: Patient refused    Gets together: Patient refused    Attends religious service: Patient refused    Active member of club or organization: Patient refused    Attends meetings of clubs or organizations: Patient refused    Relationship status: Patient refused  . Intimate partner violence:    Fear of current or ex partner: Patient refused    Emotionally abused: Patient refused    Physically abused: Patient refused    Forced sexual activity: Patient refused  Other Topics Concern  . Not on file  Social History Narrative   Lives at home w/ his wife   Patient is right handed.   Patient has 1-2 cups of caffeine daily.    Outpatient Medications Prior to Visit  Medication Sig Dispense  Refill  . acetaminophen (TYLENOL) 325 MG tablet Take 1-2 tablets (325-650 mg total) by mouth every 4 (four) hours as needed for mild pain.    . brimonidine-timolol (COMBIGAN) 0.2-0.5 % ophthalmic solution Place 1 drop into both eyes 2 (two) times daily.     . cholecalciferol (VITAMIN D) 1000 units tablet Take 1,000 Units by mouth daily.     . diphenoxylate-atropine (LOMOTIL) 2.5-0.025 MG tablet Take 1 tablet by mouth every 6 (six) hours as needed for diarrhea or loose stools. 90 tablet 1  . ELIQUIS 5 MG TABS tablet TAKE 1 TABLET (5 MG TOTAL) BY MOUTH 2 (TWO) TIMES DAILY. 60 tablet 3  . furosemide (LASIX) 40 MG tablet Take 0.5 tablets (20 mg total) by mouth daily. 45 tablet 3  . gabapentin (NEURONTIN) 100 MG capsule Take one to two at night as needed for "right ankle" pain. 60 capsule 1  . imatinib (GLEEVEC) 100 MG tablet Take 300 mg by mouth daily with lunch. Take with noon meal and a large glass of water.Caution:Chemotherapy    . isosorbide mononitrate (IMDUR) 30 MG 24 hr tablet Take 1 tablet (30 mg total) by mouth daily. 90 tablet 3  . lidocaine (LIDODERM) 5 % Place 1 patch onto the skin daily as needed (for back pain). Remove & Discard patch within 12 hours or as directed by MD     . loperamide (IMODIUM) 2 MG capsule Take by mouth as needed for diarrhea or loose stools.    . Multiple Vitamin (MULTIVITAMIN) tablet Take 1 tablet by mouth daily.    . potassium chloride (K-DUR) 10 MEQ tablet Take 2 tablets (20 mEq total) by mouth daily. 60 tablet 0  . predniSONE (DELTASONE) 1 MG tablet TAKE 3 TABLETS BY MOUTH ONCE DAILY WITH BREAKFAST 270 tablet 0  . pyridostigmine (MESTINON) 60 MG tablet Take 60 mg by mouth daily.    . rosuvastatin (CRESTOR) 10 MG tablet Take 5 mg by  mouth daily.    . traZODone (DESYREL) 100 MG tablet Take 1 tablet (100 mg total) by mouth at bedtime. 30 tablet 2  . oxyCODONE-acetaminophen (PERCOCET/ROXICET) 5-325 MG tablet Take 1-2 tablets by mouth at bedtime as needed for severe  pain. (Patient not taking: Reported on 06/07/2018) 7 tablet 0   No facility-administered medications prior to visit.     Allergies  Allergen Reactions  . Other Other (See Comments)    Regarding scans, the PATIENT IS CLAUSTROPHOBIC!!!  . Phenergan [Promethazine Hcl] Swelling and Other (See Comments)    Cannot be combined with Zofran because swelling of the eyes resulted and patient became very restless-  . Zofran [Ondansetron Hcl] Swelling and Other (See Comments)    Cannot be combined with Phenergan because swelling of the eyes resulted and patient became very restless-  . Sulfamethoxazole Rash and Itching    ROS Review of Systems  Constitutional: Negative.   Respiratory: Negative.   Cardiovascular: Negative.   Gastrointestinal: Negative.   Musculoskeletal: Negative for arthralgias and back pain.  Skin: Negative for color change and pallor.  Neurological: Positive for numbness. Negative for weakness.  Hematological: Does not bruise/bleed easily.  Psychiatric/Behavioral: Negative.       Objective:    Physical Exam  Constitutional: He is oriented to person, place, and time. He appears well-developed and well-nourished. No distress.  HENT:  Head: Normocephalic and atraumatic.  Right Ear: External ear normal.  Left Ear: External ear normal.  Eyes: Right eye exhibits no discharge. Left eye exhibits no discharge. No scleral icterus.  Pulmonary/Chest: Effort normal.  Musculoskeletal:        General: Edema (trace edema circa right ankle imprinting of ridges from socks.) present.  Neurological: He is alert and oriented to person, place, and time.  Skin: He is not diaphoretic.  Psychiatric: He has a normal mood and affect. His behavior is normal.    Ht 5\' 6"  (1.676 m)   BMI 25.18 kg/m  Wt Readings from Last 3 Encounters:  04/14/18 156 lb (70.8 kg)  03/16/18 155 lb (70.3 kg)  03/09/18 162 lb (73.5 kg)     Health Maintenance Due  Topic Date Due  . URINE MICROALBUMIN   07/19/2015  . OPHTHALMOLOGY EXAM  11/25/2017  . FOOT EXAM  04/07/2018    There are no preventive care reminders to display for this patient.  Lab Results  Component Value Date   TSH 0.51 09/27/2017   Lab Results  Component Value Date   WBC 7.5 03/16/2018   HGB 9.7 (L) 03/16/2018   HCT 29.7 (L) 03/16/2018   MCV 97 03/16/2018   PLT 254 03/16/2018   Lab Results  Component Value Date   NA 141 03/16/2018   K 4.5 03/16/2018   CO2 26 03/16/2018   GLUCOSE 107 (H) 03/16/2018   BUN 8 03/16/2018   CREATININE 1.22 03/16/2018   BILITOT 0.5 02/11/2018   ALKPHOS 41 02/11/2018   AST 39 02/11/2018   ALT 19 02/11/2018   PROT 4.7 (L) 02/11/2018   ALBUMIN 2.6 (L) 02/11/2018   CALCIUM 9.0 03/16/2018   ANIONGAP 8 02/17/2018   GFR 51.48 (L) 10/06/2017   Lab Results  Component Value Date   CHOL 134 07/20/2017   Lab Results  Component Value Date   HDL 33 (L) 07/20/2017   Lab Results  Component Value Date   LDLCALC 68 07/20/2017   Lab Results  Component Value Date   TRIG 166 (H) 07/20/2017   Lab Results  Component  Value Date   CHOLHDL 4.1 07/20/2017   Lab Results  Component Value Date   HGBA1C 5.6 03/02/2018      Assessment & Plan:   Problem List Items Addressed This Visit      Other   Neuropathic pain of right ankle      No orders of the defined types were placed in this encounter.   Follow-up: Return in about 7 weeks (around 08/16/2018), or after lunch.    Libby Maw, MD minutes of non-face-to-face time during this encounter.    We will titrate Neurontin for symptom relief.  Follow-up first week of July for B12 folate and TSH.

## 2018-06-28 NOTE — Telephone Encounter (Signed)
Last O.V. routed to [place]: [name], per patient request.

## 2018-06-30 ENCOUNTER — Ambulatory Visit: Payer: Medicare Other | Admitting: Physical Therapy

## 2018-06-30 ENCOUNTER — Telehealth: Payer: Self-pay | Admitting: Cardiovascular Disease

## 2018-06-30 ENCOUNTER — Other Ambulatory Visit: Payer: Self-pay

## 2018-06-30 ENCOUNTER — Encounter: Payer: Self-pay | Admitting: Physical Therapy

## 2018-06-30 DIAGNOSIS — M6281 Muscle weakness (generalized): Secondary | ICD-10-CM

## 2018-06-30 DIAGNOSIS — R293 Abnormal posture: Secondary | ICD-10-CM

## 2018-06-30 DIAGNOSIS — R2681 Unsteadiness on feet: Secondary | ICD-10-CM

## 2018-06-30 DIAGNOSIS — R2689 Other abnormalities of gait and mobility: Secondary | ICD-10-CM

## 2018-06-30 DIAGNOSIS — Z9181 History of falling: Secondary | ICD-10-CM | POA: Diagnosis not present

## 2018-06-30 NOTE — Telephone Encounter (Signed)
iphone/Consent/ don't bill the [employer] insurance if it doesn't cover for this virtual/ my chart active/ pre reg completed

## 2018-06-30 NOTE — Therapy (Signed)
[facility] [address] [place], [name], [address] Phone: [phone]   Fax:  [phone]  Physical Therapy Treatment  Patient Details  Name: [name] MRN: [id number] Date of Birth: 1940-12-02 Referring Provider (PT): Kathrynn Ducking, MD   Encounter Date: 06/30/2018  PT End of Session - 06/30/18 1257    Visit Number  5    Number of Visits  20    Date for PT Re-Evaluation  08/26/18    Authorization Type  Medicare & [employer]    PT Start Time  0930    PT Stop Time  1018    PT Time Calculation (min)  48 min    Equipment Utilized During Treatment  Gait belt    Activity Tolerance  Patient tolerated treatment well    Behavior During Therapy  [facility] for tasks assessed/performed       CLINIC OPERATION CHANGES: [facility] is operating at a low capacity due to COVID-19.  The patient was brought into the clinic for evaluation and/or treatment following universal masking by staff, social distancing (>50' from other patient being treated), and only 2 patients (5 people counting patients, his wife & therapist) in the clinic.  The patient's COVID risk of complications score is 9.   Past Medical History:  Diagnosis Date  . Bruises easily    d/t being on Eliquis  . Carotid artery disease ([place])    a. Duplex 05/2014: 99-37% RICA, 1-69% LICA, elevated velocities in right subclavian artery, normal left subclavian artery..  . Chronic back pain    HNP  . Chronic kidney disease (CKD)   . Claustrophobia    takes Ativan as needed  . 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 Coronary atherosclerosis of unspecified type of vessel, native or graft    a. cath in 2003 showed 10-20% LM, scattered 20% prox-mid LAD, 30% more distal LAD, 50-60% stenosis of LAD towards apex, 20% Cx, 30% dRCA, focal 95% stenosis in smaller of 2 branches of PDA, EF 60-65%.  . Diabetes mellitus ([place])   . Diarrhea     takes Imodium daily as needed  . Diverticulosis   . Diverticulosis of colon (without mention of hemorrhage)   . DJD (degenerative joint disease)   . DVT (deep venous thrombosis) ([place]) 07/2011  . Esophageal stricture   . Essential hypertension    takes Imdur and Lisinopril daily  . Gastric polyps    benign  . GERD (gastroesophageal reflux disease)    takes Nexium daily  . Glaucoma    uses eye drops  . History of bronchitis    not sure when the last time  . History of colon polyps    benign  . History of kidney stones   . History of kidney stones   . Insomnia    takes Melatonin nightly as needed  . Mixed hyperlipidemia    takes Crestor daily  . Myasthenia gravis ([place])    uses prednisone  . Osteoporosis 2016  . Peripheral edema    takes Lasix daily  . Pituitary tumor    checks Dr[facility]Walden at [facility] checks it every Jan   . Pneumonia 01/2016  . PONV (postoperative nausea and vomiting)   . Ptosis   . Pulmonary embolus ([place]) 2012  . Stroke [facility]) 06/2015    Past Surgical History:  Procedure Laterality Date  . ABDOMINAL AORTOGRAM W/LOWER EXTREMITY N/A 11/18/2016   Procedure: ABDOMINAL AORTOGRAM W/LOWER  EXTREMITY;  Surgeon: [name], MD;  Location: [facility];  Service: Cardiovascular;  Laterality: N/A;  . AMPUTATION Right 02/07/2018   Procedure: right leg AMPUTATION ABOVE KNEE;  Surgeon: [name], MD;  Location: [facility];  Service: Vascular;  Laterality: Right;  . AORTOGRAM Right 12/23/2017   Procedure: AORTOGRAM, RIGHT LOWER EXTREMITY ANGIOGRAM, RIGHT SUPERFICIAL FEMORAL ARTERY ANGIOPLASTY WITH STENT, RIGHT POPLITEAL ARTERY ANGIOPLASTY WITH STENT, LEFT GROIN CUTDOWN AND LEFT FEMORAL ARTERY REPAIR;  Surgeon: [name], MD;  Location: [facility];  Service: Vascular;  Laterality: Right;  . BACK SURGERY     x2, lumbar and cervical  . CARDIAC CATHETERIZATION    . CATARACT EXTRACTION Bilateral 12/12  . COLONOSCOPY    .  ESOPHAGOGASTRODUODENOSCOPY ENDOSCOPY  04/2015  . IR GENERIC HISTORICAL  03/03/2016   IR IVC FILTER PLMT / S&I [name] GUID/MOD SED 03/03/2016 [name], MD [facility]  . IR GENERIC HISTORICAL  04/09/2016   IR RADIOLOGIST EVAL & MGMT 04/09/2016 [name], MD [facility] INTER[facility] RAD  . IR GENERIC HISTORICAL  04/30/2016   IR IVC FILTER RETRIEVAL / S&I [name] GUID/MOD SED 04/30/2016 [name], MD [facility]INTER[facility]  . LITHOTRIPSY    . LUMBAR LAMINECTOMY/DECOMPRESSION MICRODISCECTOMY Right 09/14/2012   Procedure: Right Lumbar three-four, four-five, Lumbar five-Sacral one decompressive laminectomy;  Surgeon: [name], MD;  Location: [facility];  Service: Neurosurgery;  Laterality: Right;  . LUMBAR LAMINECTOMY/DECOMPRESSION MICRODISCECTOMY Left 03/11/2016   Procedure: Left Lumbar Two-ThreeMicrodiscectomy;  Surgeon: [name], MD;  Location: [facility];  Service: Neurosurgery;  Laterality: Left;  . PERIPHERAL VASCULAR INTERVENTION  11/18/2016   Procedure: PERIPHERAL VASCULAR INTERVENTION;  Surgeon: [name], MD;  Location: [place] [facility];  Service: Cardiovascular;;  Left popliteal  . SHOULDER SURGERY Left 05/07/2009  . TONSILLECTOMY      There were no vitals filed for this visit.  Subjective Assessment - 06/30/18 0933    Subjective  He has been wearing prosthesis 2hrs 2x/day. He has been walking with wife with RW 2x/day short distances. No falls.     Patient is accompained by:  Family member   wife, [name]   Pertinent History   Back surgery X 4; Tonsillectomy; Cataract extraction (Bilateral, 12/12); Shoulder surgery (Left, 05/07/2009); Cardiac catheterization; Lumbar laminectomy/decompression microdiscectomy (Right, 09/14/2012); Esophagogastroduodenoscopy endoscopy (04/2015); ir generic historical (03/03/2016); Colonoscopy; Lithotripsy; Lumbar laminectomy/decompression microdiscectomy (Left, 03/11/2016); ir generic historical (04/09/2016); ir generic historical (04/30/2016); ABDOMINAL AORTOGRAM  W/LOWER EXTREMITY (N/A, 11/18/2016); PERIPHERAL VASCULAR INTERVENTION (11/18/2016); Aortogram (Right, 12/23/2017); and Transfemoral Amputation (Right, 02/07/2018).    Limitations  Lifting;Standing;Walking;House hold activities    Patient Stated Goals  He wants to walk with prosthesis, get around house & back in community. Try golf again.     Currently in Pain?  No/denies    Pain Onset  More than a month ago                       [facility] Adult PT Treatment/Exercise - 06/30/18 1501      Transfers   Transfers  Sit to Stand;Stand to [employer] Transfers    Sit to Stand  5: Supervision;With upper extremity assist;With armrests;From chair/3-in-1;4: Min assist   to [place], supervision chairs with [place] no armrests   Sit to Stand Details  Verbal cues for technique;Visual cues/gestures for sequencing    Stand to Sit  5: Supervision;With upper extremity assist;With armrests;To chair/3-in-1;4: Min guard   from RW, min  guard chairs without armrests   Stand to Sit Details (indicate cue type and reason)  Verbal cues for technique;Visual cues/gestures for sequencing;Visual cues for safe use of DME/AE    Stand Pivot Transfers  5: Supervision   RW & TFA prosthesis for w/c to /from [employer] Transfer Details (indicate cue type and reason)  verbal cues on technique to get in/our of car with TFA prosthesis      Ambulation/Gait   Ambulation/Gait  Yes    Ambulation/Gait Assistance  5: Supervision    Ambulation/Gait Assistance Details  sequencing: RW, RLE, RW, LLE .Marland Kitchen. with proper step length for prosthesis & facilitating more of step through pattern.     Ambulation Distance (Feet)  75 Feet   47' & 50'   Assistive device  Rolling walker;Prosthesis    Gait Pattern  Step-to pattern;Decreased step length - left;Decreased stance time - right;Decreased stride length;Decreased hip/knee flexion - right;Decreased weight shift to right;Right hip hike;Right circumduction;Antalgic;Lateral  hip instability;Trunk flexed;Narrow base of support;Poor foot clearance - right    Ambulation Surface  Indoor;Level    Stairs  Yes    Stairs Assistance  3: Mod assist    Stairs Assistance Details (indicate cue type and reason)  PT demo & verbal cues prior and verbal & tactile cues during on technique with TFA prosthesis sequence & wt shift    Stair Management Technique  Two rails;Step to pattern;Forwards    Number of Stairs  4      Prosthetics   Prosthetic Care Comments   philosophy of progressing wear to most of awake hours.  PT recommended 3hrs 2x/day wear with initial wear upon arising.     Current prosthetic wear tolerance (days/week)   daily    Current prosthetic wear tolerance (#hours/day)   2hr 2x/day    Current prosthetic weight-bearing tolerance (hours/day)   no pain with standing & gait today    Residual limb condition   intact with no issues, normal moisture,     Education Provided  Residual limb care;Proper wear schedule/adjustment;Proper weight-bearing schedule/adjustment;Proper Donning;Other (comment);Correct ply sock adjustment   see prosthetic care comments   Person(s) Educated  Patient;Spouse    Education Method  Explanation;Demonstration;Tactile cues;Verbal cues    Education Method  Verbalized understanding;Returned demonstration;Tactile cues required;Verbal cues required;Needs further instruction    Donning Prosthesis  Supervision             PT Education - 06/30/18 1514    Education Details  Ambulate with wife with RW ~1x/hr when wearing prosthesis. increase wear to 3hrs 2x/day so goal is 6 walks /day of varying length up to 75'.     Person(s) Educated  Spouse;Patient    Methods  Explanation;Verbal cues    Comprehension  Verbalized understanding;Need further instruction       PT Short Term Goals - 06/30/18 1507      PT SHORT TERM GOAL #1   Title  Patient demonstrates proper donning including tightening suspension strap. (All STGs Target Date: 06/30/2018)     Baseline  06/30/2018  Pt demonstrates proper donning but needs intermittent verbal cues.     Time  1    Period  Months    Status  Partially Met    Target Date  06/30/18      PT SHORT TERM GOAL #2   Title  Patient tolerates wear of prosthesis >8hrs total /day without skin or limb pain issues.     Baseline  Progressing but NOT MET 06/30/2018  Patient tolerates prosthesis wear 4 hrs/day    Time  1    Period  Months    Status  Not Met    Target Date  06/30/18      PT SHORT TERM GOAL #3   Title  Patient reaches 5" anteriorly & to knee level with RW suppport with prosthesis with supervision & no loss of balance.     Baseline  MET 06/30/2018     Time  1    Period  Months    Status  Achieved    Target Date  06/30/18      PT SHORT TERM GOAL #4   Title  Patient ambulates 150' with RW & prosthesis with minA.     Baseline  Progressing but NOT MET 06/30/2018  Pt ambulates up to 47' with RW & prosthesis with minA.     Time  1    Period  Months    Status  Not Met    Target Date  06/30/18      PT SHORT TERM GOAL #5   Title  Patient demonstrates & verbalizes understanding of initial HEP.     Time  1    Period  Months    Status  On-going    Target Date  06/30/18        PT Short Term Goals - 06/30/18 1519      PT SHORT TERM GOAL #1   Title  Patient demonstrates proper donning including tightening suspension strap safely without assistance or cues. (All STGs Target Date: 07/29/2018)    Time  1    Period  Months    Status  Revised    Target Date  07/29/18      PT SHORT TERM GOAL #2   Title  Patient tolerates wear of prosthesis >8hrs total /day without skin or limb pain issues.     Time  1    Period  Months    Status  On-going    Target Date  07/29/18      PT SHORT TERM GOAL #3   Title  Standing balance with RW reaches 7" and scans environment with head & trunk movements safely with no loss of balance.     Time  1    Period  Months    Status  Revised    Target Date  07/29/18      PT  SHORT TERM GOAL #4   Title  Patient ambulates 150' with RW & prosthesis with supervision.     Time  1    Period  Months    Status  Revised    Target Date  07/29/18      PT SHORT TERM GOAL #5   Title  Patient negotiates stairs with 2 rails, ramps & curbs with RW & prosthesis with minA.     Time  1    Period  Months    Status  New    Target Date  07/29/18        PT Long Term Goals - 06/16/18 2101      PT LONG TERM GOAL #1   Title  Patient & wife verbalize & demonstrate understanding of proper prosthetic care to enable safe use of prosthesis. (All LTGs Target Date: 08/26/2018)    Time  12    Period  Weeks    Status  On-going      PT LONG TERM GOAL #2   Title  Patient tolerates wearing prosthesis >90% of awake  hours without skin or limb pain issues to enable function throughout his day.     Time  12    Period  Weeks    Status  On-going      PT LONG TERM GOAL #3   Title  Patient standing balance with RW support modified independent: reaching 10" anteriorly, pick up item from floor & scans environment.     Time  12    Period  Weeks    Status  On-going      PT LONG TERM GOAL #4   Title  Patient ambulates 300' with RW & prosthesis modified independent to enable community mobility.     Time  12    Period  Weeks    Status  On-going      PT LONG TERM GOAL #5   Title  Patient negotiates ramps & curbs with RW & prosthesis modified independent to enable community access.     Time  12    Period  Weeks    Status  On-going            Plan - 06/30/18 1510    Clinical Impression Statement  Patient met 2 of 4 STGs and progressed to remaining 2 but did not fully meet. Patient ambulated increased distances today. He is able to ambulate with wife short distances.  He was able to learn to negotiate stairs with PT assistance.     Personal Factors and Comorbidities  Age;Comorbidity 3+;Fitness;Past/Current Experience    Comorbidities  Back surgery x 4; Tonsillectomy; Cataract  extraction (Bilateral, 12/12); Shoulder surgery (Left, 05/07/2009); Cardiac catheterization; Lumbar laminectomy/decompression microdiscectomy (Right, 09/14/2012); Esophagogastroduodenoscopy endoscopy (04/2015); ir generic historical (03/03/2016); Colonoscopy; Lithotripsy; Lumbar laminectomy/decompression microdiscectomy (Left, 03/11/2016); ir generic historical (04/09/2016); ir generic historical (04/30/2016); ABDOMINAL AORTOGRAM W/LOWER EXTREMITY (N/A, 11/18/2016); PERIPHERAL VASCULAR INTERVENTION (11/18/2016); Aortogram (Right, 12/23/2017); and Amputation Transfemoral (Right, 02/07/2018).    Examination-Activity Limitations  Hygiene/Grooming;Lift;Locomotion Level;Reach Overhead;Stairs;Stand;Toileting;Transfers    Examination-Participation Restrictions  Church;Community Activity;Driving    Stability/Clinical Decision Making  Evolving/Moderate complexity    Rehab Potential  Good    PT Frequency  2x / week    PT Duration  12 weeks    PT Treatment/Interventions  ADLs/Self Care Home Management;Stair training;Gait training;DME Instruction;Functional mobility training;Therapeutic activities;Therapeutic exercise;Balance training;Neuromuscular re-education;Patient/family education;Prosthetic Training;Manual techniques;Vestibular    PT Next Visit Plan  in June will increase frequency to 2x/wk, work towards updated [employer] and Agree with Plan of Care  Patient;Family member/caregiver    Family Member Consulted  wife, Niv Darley       Patient will benefit from skilled therapeutic intervention in order to improve the following deficits and impairments:  Abnormal gait, Decreased activity tolerance, Decreased balance, Decreased endurance, Decreased knowledge of use of DME, Decreased mobility, Decreased range of motion, Decreased strength, Impaired flexibility, Prosthetic Dependency, Postural dysfunction  Visit Diagnosis: Muscle weakness (generalized)  Abnormal posture  Unsteadiness on feet  Other  abnormalities of gait and mobility     Problem List Patient Active Problem List   Diagnosis Date Noted  . Neuropathic pain of right ankle 06/27/2018  . Insomnia due to medical condition 04/13/2018  . Atherosclerosis of native coronary artery of native heart without angina pectoris   . Hypokalemia   . Stage 3 chronic kidney disease ([place])   . 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 Acute blood loss anemia 02/08/2018  . Post-operative pain   . Unilateral AKA, right ([place])   . Ischemic foot 02/05/2018  . Ischemia of right  lower extremity   . Pressure injury of skin 12/27/2017  . Arterial occlusive disease   . Postoperative pain   . CKD (chronic kidney disease), stage III ([place])   . History of CVA (cerebrovascular accident)   . 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 Septic arthritis of left sternoclavicular joint ([place]) 11/03/2017  . Cervical spinal cord compression ([place]) 10/30/2017  . Nasal abscess 09/25/2017  . Immunocompromised ([place]) 09/25/2017  . Acute on chronic blood loss anemia 08/05/2017  . Transient ischemic attack (TIA) 07/23/2017  . Subcortical infarction ([place])   . Right hemiparesis ([place])   . Ataxia 07/21/2017  . TIA (transient ischemic attack)   . Wheezing 06/17/2017  . Bursitis of left elbow 05/01/2017  . Dystonia 12/29/2016  . PAD (peripheral artery disease) ([place]) 11/18/2016  . Osteoporosis 08/28/2016  . Paronychia of great toe, left 08/10/2016  . Carotid arterial disease ([place]) 07/09/2016  . Hypocalcemia 07/09/2016  . Diplopia 07/02/2016  . S/P lumbar laminectomy 03/11/2016  . Radicular pain of left lower extremity 02/21/2016  . Cough 01/10/2016  . 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 Myasthenia gravis ([place])   . Diabetes mellitus type 2 in nonobese (HCC)   . Ataxia, post-stroke   . Gait disturbance, post-stroke   . Proximal leg weakness   . Cervical spondylosis without myelopathy   . Achilles tendinitis of right lower  extremity   . Chronic diastolic CHF (congestive heart failure) ([place])   . Gastroesophageal reflux disease without esophagitis   . 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 Cerebellar stroke ([place])   . Ischemic stroke ([place])   . Cerebrovascular accident (CVA) due to thrombosis of left vertebral artery ([place])   . History of pulmonary embolus ([place])   . Chronic anticoagulation   . HLD (hyperlipidemia)   . Neck pain 03/19/2015  . Pain in the chest   . 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[place])   . Diabetic gastroparesis ([place]) 03/07/2015  . Screening for osteoporosis 07/19/2014  . Dyspnea 04/09/2014  . Edema 04/09/2014  . Routine general medical examination at a health care facility 10/23/2013  . Bilateral carotid bruits 05/24/2013  . Abnormal CT scan, lung 05/24/2013  . Encounter for therapeutic drug monitoring 03/14/2013  . Lymphadenopathy, inguinal 01/11/2013  . AKI (acute kidney injury) ([place]) 10/28/2012  . 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 Myasthenia gravis with exacerbation ([place]) 03/31/2012  . Disease of salivary gland 12/09/2011  . Left facial pain 10/28/2011  . Pulmonary embolism ([place]) 10/25/2011  . 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 GERD with stricture 09/23/2011  . Long term (current) use of anticoagulants 08/03/2011  . History of pulmonary embolism 07/30/2011  . History of DVT (deep vein thrombosis) 07/30/2011  . Subconjunctival hemorrhage 06/22/2011  . Bilateral conjunctivitis 04/09/2011  . Diabetes mellitus type 2, controlled ([place]) 12/18/2010  . Hearing loss 08/04/2010  . Ptosis of eyelid 12/30/2009  . Essential hypertension 04/24/2009  . HYPERLIPIDEMIA, MIXED 10/05/2008  . HEMORRHOIDS-INTERNAL 07/02/2008  . HEMORRHOIDS-EXTERNAL 07/02/2008  . DIVERTICULOSIS-COLON 07/02/2008  . PERSONAL HX COLONIC POLYPS 07/02/2008  . RASH-NONVESICULAR 02/07/2008  . PNEUMONIA 12/08/2007  . Chronic myeloid leukemia in  remission ([place]) 07/07/2007  . CAD (coronary artery disease) 07/07/2007  . NEPHROLITHIASIS, HX OF 07/07/2007    Jamey Reas PT, DPT 06/30/2018, 3:17 PM  [facility] [address] [place], [name], [address] Phone: [phone]   Fax:  [phone]  Name: [name] MRN: [id number] Date of Birth: Jun 01, 1940

## 2018-07-05 ENCOUNTER — Encounter: Payer: Self-pay | Admitting: Cardiovascular Disease

## 2018-07-05 ENCOUNTER — Telehealth (INDEPENDENT_AMBULATORY_CARE_PROVIDER_SITE_OTHER): Payer: Medicare Other | Admitting: Cardiovascular Disease

## 2018-07-05 VITALS — BP 153/64 | HR 53 | Ht 66.0 in | Wt 145.0 lb

## 2018-07-05 DIAGNOSIS — I739 Peripheral vascular disease, unspecified: Secondary | ICD-10-CM

## 2018-07-05 NOTE — Progress Notes (Signed)
Virtual Visit via Video Note   This visit type was conducted due to national recommendations for restrictions regarding the COVID-19 Pandemic (e.g. social distancing) in an effort to limit this patient's exposure and mitigate transmission in our community.  Due to his co-morbid illnesses, this patient is at least at moderate risk for complications without adequate follow up.  This format is felt to be most appropriate for this patient at this time.  All issues noted in this document were discussed and addressed.  A limited physical exam was performed with this format.  Please refer to the patient's chart for his consent to telehealth for [facility].   Evaluation Performed:  Follow-up visit  Date:  07/05/2018   ID:  [name], [name] December 26, 1940, MRN [id number]  Patient Location: Home Provider Location: Office  PCP:  Libby Maw, MD  Cardiologist:  Sherren Mocha, MD  Electrophysiologist:  None   Chief Complaint:  f/u  History of Present Illness:    [name] is a 78 y.o. male who was seen for a video visit regarding peripheral arterial disease. He has chronic medical conditions that include coronary artery disease, diastolic heart failure, chronic myelogenous leukemia in remission, myasthenia gravis, recurrent DVT/pulmonary embolism (onchronic anticoagulation), peripheral arterial disease, chronic kidney disease and recurrent stroke. 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    He was seen in 2018 for severe bilateral calf claudication.   I proceeded with CO2 angiography in October 2018 which showed no significant aortoiliac disease.  There was heavily calcified occluded distal left SFA into the proximal left popliteal artery with three-vessel runoff below the knee.  This was treated with drug-coated balloon angioplasty and self-expanding stent placement due to flow-limiting dissection.  On the right side, there was a calcified eccentric stenosis affecting the common femoral artery with borderline significant disease affecting the distal SFA and distal popliteal arteries. He presented in late 2019 with acute limb ischemia affecting the right lower extremity with evidence of occluded popliteal artery by duplex.  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   Is currently using a prosthesis.  He complains recently of increased numbness and swelling of the left leg.  He underwent evaluation by arterial duplex which showed no significant change from before.  There was moderate proximal to mid left SFA disease with no other obstructive disease. He was placed on gabapentin by his primary care physician with some improvement in numbness.  He still complains about increased swelling.  He takes furosemide daily.  The patient does not have symptoms concerning for COVID-19 infection (fever, chills, cough, or new shortness of breath).    Past Medical History:  Diagnosis Date  . Bruises easily    d/t being on Eliquis  . Carotid artery disease ([place])    a. Duplex 05/2014: 49-70% RICA, 2-63% LICA, elevated velocities in right subclavian artery, normal left subclavian artery..  . Chronic back pain    HNP  . Chronic kidney disease (CKD)   . Claustrophobia    takes Ativan as needed  . 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 Coronary atherosclerosis of unspecified type of vessel, native or graft    a. cath in 2003 showed 10-20% LM, scattered 20% prox-mid LAD, 30% more distal LAD, 50-60% stenosis of LAD towards apex, 20% Cx, 30% dRCA, focal 95% stenosis in  smaller of 2 branches of PDA, EF 60-65%.  . Diabetes mellitus ([place])   . Diarrhea    takes Imodium daily as needed  . Diverticulosis   . Diverticulosis of colon (without mention of hemorrhage)   . DJD (degenerative joint disease)   . DVT  (deep venous thrombosis) ([place]) 07/2011  . Esophageal stricture   . Essential hypertension    takes Imdur and Lisinopril daily  . Gastric polyps    benign  . GERD (gastroesophageal reflux disease)    takes Nexium daily  . Glaucoma    uses eye drops  . History of bronchitis    not sure when the last time  . History of colon polyps    benign  . History of kidney stones   . History of kidney stones   . Insomnia    takes Melatonin nightly as needed  . Mixed hyperlipidemia    takes Crestor daily  . Myasthenia gravis ([place])    uses prednisone  . Osteoporosis 2016  . Peripheral edema    takes Lasix daily  . Pituitary tumor    checks Dr.Walden at [facility] checks it every Jan   . Pneumonia 01/2016  . PONV (postoperative nausea and vomiting)   . Ptosis   . Pulmonary embolus ([place]) 2012  . Stroke [facility]) 06/2015   Past Surgical History:  Procedure Laterality Date  . ABDOMINAL AORTOGRAM W/LOWER EXTREMITY N/A 11/18/2016   Procedure: ABDOMINAL AORTOGRAM W/LOWER EXTREMITY;  Surgeon: [name], MD;  Location: [place] [facility];  Service: Cardiovascular;  Laterality: N/A;  . AMPUTATION Right 02/07/2018   Procedure: right leg AMPUTATION ABOVE KNEE;  Surgeon: [name], MD;  Location: [place];  Service: Vascular;  Laterality: Right;  . AORTOGRAM Right 12/23/2017   Procedure: AORTOGRAM, RIGHT LOWER EXTREMITY ANGIOGRAM, RIGHT SUPERFICIAL FEMORAL ARTERY ANGIOPLASTY WITH STENT, RIGHT POPLITEAL ARTERY ANGIOPLASTY WITH STENT, LEFT GROIN CUTDOWN AND LEFT FEMORAL ARTERY REPAIR;  Surgeon: [name], MD;  Location: [place];  Service: Vascular;  Laterality: Right;  . BACK SURGERY     x2, lumbar and cervical  . CARDIAC CATHETERIZATION    . CATARACT EXTRACTION Bilateral 12/12  . COLONOSCOPY    . ESOPHAGOGASTRODUODENOSCOPY ENDOSCOPY  04/2015  . IR GENERIC HISTORICAL  03/03/2016   IR IVC FILTER PLMT / S&I [name] GUID/MOD SED 03/03/2016 [name], MD [facility]  . IR GENERIC  HISTORICAL  04/09/2016   IR RADIOLOGIST EVAL & MGMT 04/09/2016 [name], MD [facility] INTER[facility] RAD  . IR GENERIC HISTORICAL  04/30/2016   IR IVC FILTER RETRIEVAL / S&I [name] GUID/MOD SED 04/30/2016 [name], MD [facility]-INTER[facility] RAD  . LITHOTRIPSY    . LUMBAR LAMINECTOMY/DECOMPRESSION MICRODISCECTOMY Right 09/14/2012   Procedure: Right Lumbar three-four, four-five, Lumbar five-Sacral one decompressive laminectomy;  Surgeon: [name], MD;  Location: [place] [facility];  Service: Neurosurgery;  Laterality: Right;  . LUMBAR LAMINECTOMY/DECOMPRESSION MICRODISCECTOMY Left 03/11/2016   Procedure: Left Lumbar Two-ThreeMicrodiscectomy;  Surgeon: [name], MD;  Location: [place];  Service: Neurosurgery;  Laterality: Left;  . PERIPHERAL VASCULAR INTERVENTION  11/18/2016   Procedure: PERIPHERAL VASCULAR INTERVENTION;  Surgeon: [name], MD;  Location: [place] [facility];  Service: Cardiovascular;;  Left popliteal  . SHOULDER SURGERY Left 05/07/2009  . TONSILLECTOMY       Current Meds  Medication Sig  . acetaminophen (TYLENOL) 325 MG tablet Take 1-2 tablets (325-650 mg total) by mouth every 4 (four) hours as needed for mild pain.  Marland Kitchen  brimonidine-timolol (COMBIGAN) 0.2-0.5 % ophthalmic solution Place 1 drop into both eyes 2 (two) times daily.   . cholecalciferol (VITAMIN D) 1000 units tablet Take 1,000 Units by mouth daily.   . diphenoxylate-atropine (LOMOTIL) 2.5-0.025 MG tablet Take 1 tablet by mouth every 6 (six) hours as needed for diarrhea or loose stools.  Marland Kitchen ELIQUIS 5 MG TABS tablet TAKE 1 TABLET (5 MG TOTAL) BY MOUTH 2 (TWO) TIMES DAILY.  . furosemide (LASIX) 40 MG tablet Take 0.5 tablets (20 mg total) by mouth daily.  Marland Kitchen gabapentin (NEURONTIN) 100 MG capsule Take one at night for one week and then take one twice daily.  . isosorbide mononitrate (IMDUR) 30 MG 24 hr tablet Take 1 tablet (30 mg total) by mouth daily.  Marland Kitchen lidocaine (LIDODERM) 5 % Place 1 patch onto the skin daily as needed (for back  pain). Remove & Discard patch within 12 hours or as directed by MD   . loperamide (IMODIUM) 2 MG capsule Take by mouth as needed for diarrhea or loose stools.  . Multiple Vitamin (MULTIVITAMIN) tablet Take 1 tablet by mouth daily.  . potassium chloride (K-DUR) 10 MEQ tablet Take 2 tablets (20 mEq total) by mouth daily.  . predniSONE (DELTASONE) 1 MG tablet TAKE 3 TABLETS BY MOUTH ONCE DAILY WITH BREAKFAST  . pyridostigmine (MESTINON) 60 MG tablet Take 60 mg by mouth daily.  . rosuvastatin (CRESTOR) 10 MG tablet Take 5 mg by mouth daily.  . tamsulosin (FLOMAX) 0.4 MG CAPS capsule Take 0.4 mg by mouth daily after supper.  . traZODone (DESYREL) 100 MG tablet Take 1 tablet (100 mg total) by mouth at bedtime.     Allergies:   Other; Phenergan [promethazine hcl]; Zofran [ondansetron hcl]; and Sulfamethoxazole   Social History   Tobacco Use  . Smoking status: Never Smoker  . Smokeless tobacco: Never Used  Substance Use Topics  . Alcohol use: No    Alcohol/week: 0.0 standard drinks  . Drug use: No     Family Hx: The patient's family history includes Breast cancer in his sister; Clotting disorder in his sister; Dementia in his sister; Heart attack in his father, mother, sister, sister, and sister; Heart disease in his father, mother, and sister; Stroke in his father and mother. There is no history of Colon cancer.  ROS:   Please see the history of present illness.     All other systems reviewed and are negative.   Prior CV studies:   The following studies were reviewed today:  I reviewed most recent vascular studies with him.  Labs/Other Tests and Data Reviewed:    EKG:  No ECG reviewed.  Recent Labs: 09/27/2017: TSH 0.51 10/06/2017: Pro B Natriuretic peptide (BNP) 73.0 11/06/2017: Magnesium 2.0 02/11/2018: ALT 19 03/16/2018: BUN 8; Creatinine, Ser 1.22; Hemoglobin 9.7; Platelets 254; Potassium 4.5; Sodium 141   Recent Lipid Panel Lab Results  Component Value Date/Time   CHOL 134  07/20/2017 05:25 AM   TRIG 166 (H) 07/20/2017 05:25 AM   HDL 33 (L) 07/20/2017 05:25 AM   CHOLHDL 4.1 07/20/2017 05:25 AM   LDLCALC 68 07/20/2017 05:25 AM   LDLDIRECT 59.8 10/12/2012 07:37 AM    Wt Readings from Last 3 Encounters:  07/05/18 145 lb (65.8 kg)  04/14/18 156 lb (70.8 kg)  03/16/18 155 lb (70.3 kg)     Objective:    Vital Signs:  BP (!) 153/64   Pulse (!) 53   Ht 5\' 6"  (1.676 m)   Wt 145 lb (  65.8 kg)   BMI 23.40 kg/m    VITAL SIGNS:  reviewed GEN:  no acute distress EYES:  sclerae anicteric, EOMI - Extraocular Movements Intact RESPIRATORY:  normal respiratory effort, symmetric expansion CARDIOVASCULAR: Moderate left leg edema SKIN:  no rash, lesions or ulcers. MUSCULOSKELETAL:  no obvious deformities. NEURO:  alert and oriented x 3, no obvious focal deficit PSYCH:  normal affect  ASSESSMENT & PLAN:     1.  Peripheral arterial disease :   Status post left SFA drug-coated balloon angioplasty and self-expanding stent placement .  He is status post right above-the-knee amputation for acute limb ischemia.  Recent duplex on the left was reassuring. He does have significant edema affecting the left leg.  I advised him to elevate his legs frequently during the day.  He continues to take furosemide 40 mg once daily.  Underlying chronic kidney disease might also be contributing.  Previous echocardiogram in June 2019 showed low normal LV systolic function. The patient is scheduled to see Richardson Dopp next week.  If it is an office visit, routine labs should be considered.  2. Coronary artery disease involving native coronary arteries without angina: Continue medical therapy.  3. CML: Currently on Gleevec.  4. Hyperlipidemia: Continue treatment with rosuvastatin with a target LDL of less than 70.  5.  Recurrent DVT/PE: On anticoagulation.    COVID-19 Education: The signs and symptoms of COVID-19 were discussed with the patient and how to seek care for testing  (follow up with PCP or arrange E-visit).  The importance of social distancing was discussed today.  Time:   Today, I have spent 12 minutes with the patient with telehealth technology discussing the above problems.     Medication Adjustments/Labs and Tests Ordered: Current medicines are reviewed at length with the patient today.  Concerns regarding medicines are outlined above.   Tests Ordered: No orders of the defined types were placed in this encounter.   Medication Changes: No orders of the defined types were placed in this encounter.   Disposition:  Follow up with me in 6 months  Signed, [name], MD  07/05/2018 9:53 AM    [place]

## 2018-07-05 NOTE — Patient Instructions (Signed)
Medication Instructions:  No change in medications If you need a refill on your cardiac medications before your next appointment, please call your pharmacy.   Lab work: None If you have labs (blood work) drawn today and your tests are completely normal, you will receive your results only by: . MyChart Message (if you have MyChart) OR . A paper copy in the mail If you have any lab test that is abnormal or we need to change your treatment, we will call you to review the results.  Testing/Procedures: None  Follow-Up: Follow-up with Dr. Arida in 6 months  

## 2018-07-07 ENCOUNTER — Ambulatory Visit: Payer: Medicare Other | Admitting: Physical Therapy

## 2018-07-07 ENCOUNTER — Telehealth: Payer: Self-pay | Admitting: *Deleted

## 2018-07-07 NOTE — Telephone Encounter (Signed)
LVM TO CALL BACK AND SET UP OFFICE VISIT

## 2018-07-12 ENCOUNTER — Other Ambulatory Visit: Payer: Self-pay

## 2018-07-12 ENCOUNTER — Encounter: Payer: Self-pay | Admitting: Physical Therapy

## 2018-07-12 ENCOUNTER — Ambulatory Visit: Payer: Medicare Other | Attending: Neurology | Admitting: Physical Therapy

## 2018-07-12 DIAGNOSIS — Z9181 History of falling: Secondary | ICD-10-CM | POA: Insufficient documentation

## 2018-07-12 DIAGNOSIS — R293 Abnormal posture: Secondary | ICD-10-CM | POA: Insufficient documentation

## 2018-07-12 DIAGNOSIS — R2681 Unsteadiness on feet: Secondary | ICD-10-CM | POA: Insufficient documentation

## 2018-07-12 DIAGNOSIS — R2689 Other abnormalities of gait and mobility: Secondary | ICD-10-CM | POA: Insufficient documentation

## 2018-07-12 DIAGNOSIS — M6281 Muscle weakness (generalized): Secondary | ICD-10-CM | POA: Diagnosis not present

## 2018-07-12 NOTE — Therapy (Signed)
[name] [address] [place], [name], [address] Phone: [phone]   Fax:  [phone]  Physical Therapy Treatment  Patient Details  Name: [name] MRN: [id number] Date of Birth: September 23, 1940 Referring Provider (PT): Tristan Ducking, MD   Encounter Date: 07/12/2018  PT End of Session - 07/12/18 1049    Visit Number  6    Number of Visits  20    Date for PT Re-Evaluation  08/26/18    Authorization Type  Medicare & [employer]    PT Start Time  0900    PT Stop Time  0948    PT Time Calculation (min)  48 min    Equipment Utilized During Treatment  Gait belt    Activity Tolerance  Patient tolerated treatment well    Behavior During Therapy  [facility] for tasks assessed/performed       Past Medical History:  Diagnosis Date  . Bruises easily    d/t being on Eliquis  . Carotid artery disease ([place])    a. Duplex 05/2014: 41-74% RICA, 0-81% LICA, elevated velocities in right subclavian artery, normal left subclavian artery..  . Chronic back pain    HNP  . Chronic kidney disease (CKD)   . Claustrophobia    takes Ativan as needed  . 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 Coronary atherosclerosis of unspecified type of vessel, native or graft    a. cath in 2003 showed 10-20% LM, scattered 20% prox-mid LAD, 30% more distal LAD, 50-60% stenosis of LAD towards apex, 20% Cx, 30% dRCA, focal 95% stenosis in smaller of 2 branches of PDA, EF 60-65%.  . Diabetes mellitus ([place])   . Diarrhea    takes Imodium daily as needed  . Diverticulosis   . Diverticulosis of colon (without mention of hemorrhage)   . DJD (degenerative joint disease)   . DVT (deep venous thrombosis) ([place]) 07/2011  . Esophageal stricture   . Essential hypertension    takes Imdur and Lisinopril daily  . Gastric polyps    benign  . GERD (gastroesophageal reflux disease)    takes Nexium daily  . Glaucoma    uses eye drops  . History of  bronchitis    not sure when the last time  . History of colon polyps    benign  . History of kidney stones   . History of kidney stones   . Insomnia    takes Melatonin nightly as needed  . Mixed hyperlipidemia    takes Crestor daily  . Myasthenia gravis ([place])    uses prednisone  . Osteoporosis 2016  . Peripheral edema    takes Lasix daily  . Pituitary tumor    checks Dr[facility]Walden at [facility] checks it every Jan   . Pneumonia 01/2016  . PONV (postoperative nausea and vomiting)   . Ptosis   . Pulmonary embolus ([place]) 2012  . Stroke [facility]) 06/2015    Past Surgical History:  Procedure Laterality Date  . ABDOMINAL AORTOGRAM W/LOWER EXTREMITY N/A 11/18/2016   Procedure: ABDOMINAL AORTOGRAM W/LOWER EXTREMITY;  Surgeon: Tristan Hampshire, MD;  Location: [place] [facility];  Service: Cardiovascular;  Laterality: N/A;  . AMPUTATION Right 02/07/2018   Procedure: right leg AMPUTATION ABOVE KNEE;  Surgeon: Tristan Mould, MD;  Location: [place];  Service: Vascular;  Laterality: Right;  . AORTOGRAM Right 12/23/2017   Procedure: AORTOGRAM, RIGHT LOWER EXTREMITY ANGIOGRAM, RIGHT SUPERFICIAL FEMORAL ARTERY ANGIOPLASTY WITH STENT,  RIGHT POPLITEAL ARTERY ANGIOPLASTY WITH STENT, LEFT GROIN CUTDOWN AND LEFT FEMORAL ARTERY REPAIR;  Surgeon: Tristan Heck, MD;  Location: [place];  Service: Vascular;  Laterality: Right;  . BACK SURGERY     x2, lumbar and cervical  . CARDIAC CATHETERIZATION    . CATARACT EXTRACTION Bilateral 12/12  . COLONOSCOPY    . ESOPHAGOGASTRODUODENOSCOPY ENDOSCOPY  04/2015  . IR GENERIC HISTORICAL  03/03/2016   IR IVC FILTER PLMT / S&I [name] GUID/MOD SED 03/03/2016 Tristan Keen, MD [facility] RAD  . IR GENERIC HISTORICAL  04/09/2016   IR RADIOLOGIST EVAL & MGMT 04/09/2016 Tristan Keen, MD [facility] INTER[facility] RAD  . IR GENERIC HISTORICAL  04/30/2016   IR IVC FILTER RETRIEVAL / S&I [name] GUID/MOD SED 04/30/2016 Tristan Keen, MD [facility]INTER[facility] RAD  . LITHOTRIPSY    . LUMBAR  LAMINECTOMY/DECOMPRESSION MICRODISCECTOMY Right 09/14/2012   Procedure: Right Lumbar three-four, four-five, Lumbar five-Sacral one decompressive laminectomy;  Surgeon: Tristan Moore, MD;  Location: [place] [facility]UR[facility];  Service: Neurosurgery;  Laterality: Right;  . LUMBAR LAMINECTOMY/DECOMPRESSION MICRODISCECTOMY Left 03/11/2016   Procedure: Left Lumbar Two-ThreeMicrodiscectomy;  Surgeon: Tristan Moore, MD;  Location: [place];  Service: Neurosurgery;  Laterality: Left;  . PERIPHERAL VASCULAR INTERVENTION  11/18/2016   Procedure: PERIPHERAL VASCULAR INTERVENTION;  Surgeon: Tristan Hampshire, MD;  Location: [place] [facility];  Service: Cardiovascular;;  Left popliteal  . SHOULDER SURGERY Left 05/07/2009  . TONSILLECTOMY      There were no vitals filed for this visit.  Subjective Assessment - 07/12/18 0900    Subjective  He walked with walker with wife for 2 days after last PT appt. Then his left adductor muscle was hurting like he pulled it. He has been wearing liner only for ~4hrs /day.     Patient is accompained by:  Family member   wife, [name]   Pertinent History   Back surgery X 4; Tonsillectomy; Cataract extraction (Bilateral, 12/12); Shoulder surgery (Left, 05/07/2009); Cardiac catheterization; Lumbar laminectomy/decompression microdiscectomy (Right, 09/14/2012); Esophagogastroduodenoscopy endoscopy (04/2015); ir generic historical (03/03/2016); Colonoscopy; Lithotripsy; Lumbar laminectomy/decompression microdiscectomy (Left, 03/11/2016); ir generic historical (04/09/2016); ir generic historical (04/30/2016); ABDOMINAL AORTOGRAM W/LOWER EXTREMITY (N/A, 11/18/2016); PERIPHERAL VASCULAR INTERVENTION (11/18/2016); Aortogram (Right, 12/23/2017); and Transfemoral Amputation (Right, 02/07/2018).    Limitations  Lifting;Standing;Walking;House hold activities    Patient Stated Goals  He wants to walk with prosthesis, get around house & back in community. Try golf again.     Currently in Pain?  Yes    Pain Score   8     Pain Location  Leg    Pain Orientation  Left;Medial   adductor muscle   Pain Descriptors / Indicators  Sore    Pain Type  Acute pain    Pain Onset  1 to 4 weeks ago    Pain Frequency  Constant    Aggravating Factors   moving or lifting leg    Pain Relieving Factors  sitting & relaxing     Effect of Pain on Daily Activities  limiting standing                       [facility] Adult PT Treatment/Exercise - 07/12/18 1247      Transfers   Transfers  Sit to Stand;Stand to [employer] Transfers    Sit to Stand  5: Supervision;With upper extremity assist;With armrests;From chair/3-in-1;4: Min assist   to RW, supervision chairs with armrest & minA mat table   Sit to Stand Details  Verbal cues for technique;Visual cues/gestures for sequencing    Stand to Sit  5: Supervision;With upper extremity assist;With armrests;To chair/3-in-1   from RW,    Stand to Sit Details (indicate cue type and reason)  Verbal cues for technique;Visual cues/gestures for sequencing;Visual cues for safe use of DME/AE    Stand Pivot Transfers  5: Supervision   RW & TFA prosthesis     Ambulation/Gait   Ambulation/Gait  Yes    Ambulation/Gait Assistance  5: Supervision    Ambulation/Gait Assistance Details  tactile & verbal cues on wt shift over prosthesis in stance.     Ambulation Distance (Feet)  60 Feet    Assistive device  Rolling walker;Prosthesis    Gait Pattern  Step-to pattern;Decreased step length - left;Decreased stance time - right;Decreased stride length;Decreased hip/knee flexion - right;Decreased weight shift to right;Right hip hike;Right circumduction;Antalgic;Lateral hip instability;Trunk flexed;Narrow base of support;Poor foot clearance - right    Ambulation Surface  Indoor;Level    Stairs  --    Stairs Assistance  --    Stair Management Technique  --    Number of Stairs  --      Prosthetics   Prosthetic Care Comments   philosophy of progressing wear to most of awake hours &  progressing with 2x/day wear.  PT recommended 4hrs 2x/day wear with initial wear upon arising. Wear liner if not able to tolerate prosthesis    Current prosthetic wear tolerance (days/week)   daily    Current prosthetic wear tolerance (#hours/day)   4hr 1x/day    Current prosthetic weight-bearing tolerance (hours/day)   no pain with standing & gait today    Residual limb condition   intact with no issues, normal moisture,     Education Provided  Residual limb care;Proper wear schedule/adjustment;Proper weight-bearing schedule/adjustment;Proper Donning;Other (comment);Correct ply sock adjustment   see prosthetic care comments   Person(s) Educated  Patient;Spouse    Education Method  Explanation;Demonstration;Tactile cues;Verbal cues    Education Method  Verbalized understanding;Returned demonstration;Tactile cues required;Verbal cues required;Needs further instruction    Donning Prosthesis  Supervision             PT Education - 07/12/18 0945    Education Details  adductor longus stretch with knee flexed & extended, seated hip flexion,  ice massage for 5-7 min 2-3/day    Person(s) Educated  Patient;Spouse    Methods  Explanation;Demonstration;Tactile cues;Verbal cues    Comprehension  Verbalized understanding;Returned demonstration;Need further instruction       PT Short Term Goals - 06/30/18 1519      PT SHORT TERM GOAL #1   Title  Patient demonstrates proper donning including tightening suspension strap safely without assistance or cues. (All STGs Target Date: 07/29/2018)    Time  1    Period  Months    Status  Revised    Target Date  07/29/18      PT SHORT TERM GOAL #2   Title  Patient tolerates wear of prosthesis >8hrs total /day without skin or limb pain issues.     Time  1    Period  Months    Status  On-going    Target Date  07/29/18      PT SHORT TERM GOAL #3   Title  Standing balance with RW reaches 7" and scans environment with head & trunk movements safely with  no loss of balance.     Time  1    Period  Months    Status  Revised    Target Date  07/29/18      PT SHORT TERM GOAL #4   Title  Patient ambulates 150' with RW & prosthesis with supervision.     Time  1    Period  Months    Status  Revised    Target Date  07/29/18      PT SHORT TERM GOAL #5   Title  Patient negotiates stairs with 2 rails, ramps & curbs with RW & prosthesis with minA.     Time  1    Period  Months    Status  New    Target Date  07/29/18        PT Long Term Goals - 06/16/18 2101      PT LONG TERM GOAL #1   Title  Patient & wife verbalize & demonstrate understanding of proper prosthetic care to enable safe use of prosthesis. (All LTGs Target Date: 08/26/2018)    Time  12    Period  Weeks    Status  On-going      PT LONG TERM GOAL #2   Title  Patient tolerates wearing prosthesis >90% of awake hours without skin or limb pain issues to enable function throughout his day.     Time  12    Period  Weeks    Status  On-going      PT LONG TERM GOAL #3   Title  Patient standing balance with RW support modified independent: reaching 10" anteriorly, pick up item from floor & scans environment.     Time  12    Period  Weeks    Status  On-going      PT LONG TERM GOAL #4   Title  Patient ambulates 300' with RW & prosthesis modified independent to enable community mobility.     Time  12    Period  Weeks    Status  On-going      PT LONG TERM GOAL #5   Title  Patient negotiates ramps & curbs with RW & prosthesis modified independent to enable community access.     Time  12    Period  Weeks    Status  On-going            Plan - 07/12/18 1252    Clinical Impression Statement  Patient appears to have mild adductor longus muscle strain that is improving. PT instructed in stretches & ice massage. Patient was able to tolerate gait with RW without limb pain.    Personal Factors and Comorbidities  Age;Comorbidity 3+;Fitness;Past/Current Experience    Comorbidities   Back surgery x 4; Tonsillectomy; Cataract extraction (Bilateral, 12/12); Shoulder surgery (Left, 05/07/2009); Cardiac catheterization; Lumbar laminectomy/decompression microdiscectomy (Right, 09/14/2012); Esophagogastroduodenoscopy endoscopy (04/2015); ir generic historical (03/03/2016); Colonoscopy; Lithotripsy; Lumbar laminectomy/decompression microdiscectomy (Left, 03/11/2016); ir generic historical (04/09/2016); ir generic historical (04/30/2016); ABDOMINAL AORTOGRAM W/LOWER EXTREMITY (N/A, 11/18/2016); PERIPHERAL VASCULAR INTERVENTION (11/18/2016); Aortogram (Right, 12/23/2017); and Amputation Transfemoral (Right, 02/07/2018).    Examination-Activity Limitations  Hygiene/Grooming;Lift;Locomotion Level;Reach Overhead;Stairs;Stand;Toileting;Transfers    Examination-Participation Restrictions  Church;Community Activity;Driving    Stability/Clinical Decision Making  Evolving/Moderate complexity    Rehab Potential  Good    PT Frequency  2x / week    PT Duration  12 weeks    PT Treatment/Interventions  ADLs/Self Care Home Management;Stair training;Gait training;DME Instruction;Functional mobility training;Therapeutic activities;Therapeutic exercise;Balance training;Neuromuscular re-education;Patient/family education;Prosthetic Training;Manual techniques;Vestibular    PT Next Visit Plan  work towards updated STGs, prosthetic gait including stairs, ramps & curbs  Consulted and Agree with Plan of Care  Patient;Family member/caregiver    Family Member Consulted  wife, Julion Gatt       Patient will benefit from skilled therapeutic intervention in order to improve the following deficits and impairments:  Abnormal gait, Decreased activity tolerance, Decreased balance, Decreased endurance, Decreased knowledge of use of DME, Decreased mobility, Decreased range of motion, Decreased strength, Impaired flexibility, Prosthetic Dependency, Postural dysfunction  Visit Diagnosis: Muscle weakness  (generalized)  Abnormal posture  Unsteadiness on feet  Other abnormalities of gait and mobility  History of falling     Problem List Patient Active Problem List   Diagnosis Date Noted  . Neuropathic pain of right ankle 06/27/2018  . Insomnia due to medical condition 04/13/2018  . Atherosclerosis of native coronary artery of native heart without angina pectoris   . Hypokalemia   . Stage 3 chronic kidney disease ([place])   . 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 Acute blood loss anemia 02/08/2018  . Post-operative pain   . Unilateral AKA, right ([place])   . Ischemic foot 02/05/2018  . Ischemia of right lower extremity   . Pressure injury of skin 12/27/2017  . Arterial occlusive disease   . Postoperative pain   . CKD (chronic kidney disease), stage III ([place])   . History of CVA (cerebrovascular accident)   . Femoral artery thrombosis, right ([place]) 12/23/2017  . Septic arthritis of left sternoclavicular joint ([place]) 11/03/2017  . Cervical spinal cord compression ([place]) 10/30/2017  . Nasal abscess 09/25/2017  . Immunocompromised ([place]) 09/25/2017  . Acute on chronic blood loss anemia 08/05/2017  . Transient ischemic attack (TIA) 07/23/2017  . Subcortical infarction ([place])   . Right hemiparesis ([place])   . Ataxia 07/21/2017  . TIA (transient ischemic attack)   . Wheezing 06/17/2017  . Bursitis of left elbow 05/01/2017  . Dystonia 12/29/2016  . PAD (peripheral artery disease) ([place]) 11/18/2016  . Osteoporosis 08/28/2016  . Paronychia of great toe, left 08/10/2016  . Carotid arterial disease ([place]) 07/09/2016  . Hypocalcemia 07/09/2016  . Diplopia 07/02/2016  . S/P lumbar laminectomy 03/11/2016  . Radicular pain of left lower extremity 02/21/2016  . Cough 01/10/2016  . 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 Myasthenia gravis ([place])   . Diabetes mellitus type 2 in nonobese ([facility])   . Ataxia, post-stroke   . Gait disturbance, post-stroke   . Proximal leg weakness    . Cervical spondylosis without myelopathy   . Achilles tendinitis of right lower extremity   . Chronic diastolic CHF (congestive heart failure) ([place])   . Gastroesophageal reflux disease without esophagitis   . 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 Cerebellar stroke ([place])   . Ischemic stroke ([place])   . Cerebrovascular accident (CVA) due to thrombosis of left vertebral artery ([place])   . History of pulmonary embolus ([place])   . Chronic anticoagulation   . HLD (hyperlipidemia)   . Neck pain 03/19/2015  . Pain in the chest   . 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[place])   . Diabetic gastroparesis ([place]) 03/07/2015  . Screening for osteoporosis 07/19/2014  . Dyspnea 04/09/2014  . Edema 04/09/2014  . Routine general medical examination at a health care facility 10/23/2013  . Bilateral carotid bruits 05/24/2013  . Abnormal CT scan, lung 05/24/2013  . Encounter for therapeutic drug monitoring 03/14/2013  . Lymphadenopathy, inguinal 01/11/2013  . AKI (acute kidney injury) ([place]) 10/28/2012  . Spinal stenosis  of lumbar region 06/01/2012  . Myasthenia gravis with exacerbation ([place]) 03/31/2012  . Disease of salivary gland 12/09/2011  . Left facial pain 10/28/2011  . Pulmonary embolism ([place]) 10/25/2011  . 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 GERD with stricture 09/23/2011  . Long term (current) use of anticoagulants 08/03/2011  . History of pulmonary embolism 07/30/2011  . History of DVT (deep vein thrombosis) 07/30/2011  . Subconjunctival hemorrhage 06/22/2011  . Bilateral conjunctivitis 04/09/2011  . Diabetes mellitus type 2, controlled ([place]) 12/18/2010  . Hearing loss 08/04/2010  . Ptosis of eyelid 12/30/2009  . Essential hypertension 04/24/2009  . HYPERLIPIDEMIA, MIXED 10/05/2008  . HEMORRHOIDS-INTERNAL 07/02/2008  . HEMORRHOIDS-EXTERNAL 07/02/2008  . DIVERTICULOSIS-COLON 07/02/2008  . PERSONAL HX COLONIC POLYPS 07/02/2008  .  RASH-NONVESICULAR 02/07/2008  . PNEUMONIA 12/08/2007  . Chronic myeloid leukemia in remission ([place]) 07/07/2007  . CAD (coronary artery disease) 07/07/2007  . NEPHROLITHIASIS, HX OF 07/07/2007    [name] PT, DPT 07/12/2018, 12:54 PM  [facility] [address] [place], [name], [address] Phone: [phone]   Fax:  [phone]  Name: [name] MRN: [id number] Date of Birth: 01-Nov-1940

## 2018-07-12 NOTE — Telephone Encounter (Signed)
Lvm to call clinic back to set up for office visit

## 2018-07-12 NOTE — Telephone Encounter (Signed)
Spoke with patient and pt consent to mychart visit

## 2018-07-14 ENCOUNTER — Other Ambulatory Visit: Payer: Self-pay

## 2018-07-14 ENCOUNTER — Ambulatory Visit: Payer: Medicare Other | Admitting: Physical Therapy

## 2018-07-14 ENCOUNTER — Encounter: Payer: Self-pay | Admitting: Physical Therapy

## 2018-07-14 DIAGNOSIS — R293 Abnormal posture: Secondary | ICD-10-CM | POA: Diagnosis not present

## 2018-07-14 DIAGNOSIS — R2681 Unsteadiness on feet: Secondary | ICD-10-CM | POA: Diagnosis not present

## 2018-07-14 DIAGNOSIS — R2689 Other abnormalities of gait and mobility: Secondary | ICD-10-CM | POA: Diagnosis not present

## 2018-07-14 DIAGNOSIS — Z9181 History of falling: Secondary | ICD-10-CM | POA: Diagnosis not present

## 2018-07-14 DIAGNOSIS — M6281 Muscle weakness (generalized): Secondary | ICD-10-CM | POA: Diagnosis not present

## 2018-07-14 NOTE — Progress Notes (Signed)
Virtual Visit via Video Note   This visit type was conducted due to national recommendations for restrictions regarding the COVID-19 Pandemic (e.g. social distancing) in an effort to limit this patient's exposure and mitigate transmission in our community.  Due to his co-morbid illnesses, this patient is at least at moderate risk for complications without adequate follow up.  This format is felt to be most appropriate for this patient at this time.  All issues noted in this document were discussed and addressed.  A limited physical exam was performed with this format.  Please refer to the patient's chart for his consent to telehealth for [facility].   Date:  07/15/2018   ID:  [name], DOB 03/09/1940, MRN [id number]  Patient Location: Home Provider Location: Home  PCP:  Libby Maw, MD  Cardiologist:  Sherren Mocha, MD   Electrophysiologist:  None  PV:  [name], MD Vascular Surgeon:  [name], MD  Evaluation Performed:  Follow-Up Visit  Chief Complaint:  FU on CAD, CHF  History of Present Illness:    [name] is a 78 y.o. male with coronary artery disease with severe distal vessel disease noted at remote cardiac catheterization treated medically, diastolic heart failure, chronic myelogenous leukemia in remission, myasthenia gravis, recurrent DVT/pulmonary embolism (onchronic anticoagulation), peripheral arterial disease with intermittent claudication status post angioplasty and stenting of the left SFA in 10/18, chronic kidney disease and recurrent stroke. 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  I last saw him in 12/2017 for ongoing leg swelling.  He was then admitted in 12/2017 with critical limb ischemia.  He underwent R SFA stenting and R popliteal artery stenting with Dr. Monica Martinez.  He eventually required R AKA in 01/2018.   He was last seen in 03/2018.     He is seen today for follow-up on coronary artery disease and heart failure.  Since last seen, he has been doing well.  He still does have some swelling in his left lower extremity.  He has been taking 40 mg of Lasix with some improvement.  His swelling does improve with elevation as well.  He has not really been short of breath.  He has not had chest pain.  He has not had orthopnea or paroxysmal nocturnal dyspnea.  He has not had syncope.  His wife notes that he has frequent urination with the Lasix.  He would like to try to go back to the 20 mg a day dose.  He is working with physical therapy for his prosthesis. He had a recent follow-up with Dr. Fletcher Anon.  Follow-up ABIs and arterial Dopplers demonstrated stable blood flow in the left leg.  The patient does not have symptoms concerning for COVID-19 infection (fever, chills, cough, or new shortness of breath).    Past Medical History:  Diagnosis Date  . Bruises easily    d/t being on Eliquis  . Carotid artery disease ([place])    a. Duplex 05/2014: 62-95% RICA, 2-84% LICA, elevated velocities in right subclavian artery, normal left subclavian artery..  . Chronic back pain    HNP  . Chronic kidney disease (CKD)   . Claustrophobia    takes Ativan as needed  . 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 Coronary atherosclerosis of unspecified type of vessel, native or graft    a. cath in 2003 showed 10-20% LM, scattered 20%  prox-mid LAD, 30% more distal LAD, 50-60% stenosis of LAD towards apex, 20% Cx, 30% dRCA, focal 95% stenosis in smaller of 2 branches of PDA, EF 60-65%.  . Diabetes mellitus ([place])   . Diarrhea    takes Imodium daily as needed  . Diverticulosis   . Diverticulosis of colon (without mention of hemorrhage)   . DJD (degenerative joint disease)   . DVT (deep venous thrombosis) ([place]) 07/2011  . Esophageal stricture   . Essential hypertension    takes Imdur and Lisinopril daily  . Gastric polyps    benign  .  GERD (gastroesophageal reflux disease)    takes Nexium daily  . Glaucoma    uses eye drops  . History of bronchitis    not sure when the last time  . History of colon polyps    benign  . History of kidney stones   . History of kidney stones   . Insomnia    takes Melatonin nightly as needed  . Mixed hyperlipidemia    takes Crestor daily  . Myasthenia gravis ([place])    uses prednisone  . Osteoporosis 2016  . Peripheral edema    takes Lasix daily  . Pituitary tumor    checks Dr.Walden at [facility] checks it every Jan   . Pneumonia 01/2016  . PONV (postoperative nausea and vomiting)   . Ptosis   . Pulmonary embolus ([place]) 2012  . Stroke [facility]) 06/2015   Past Surgical History:  Procedure Laterality Date  . ABDOMINAL AORTOGRAM W/LOWER EXTREMITY N/A 11/18/2016   Procedure: ABDOMINAL AORTOGRAM W/LOWER EXTREMITY;  Surgeon: [name], MD;  Location: [place] [facility];  Service: Cardiovascular;  Laterality: N/A;  . AMPUTATION Right 02/07/2018   Procedure: right leg AMPUTATION ABOVE KNEE;  Surgeon: [name], MD;  Location: [place];  Service: Vascular;  Laterality: Right;  . AORTOGRAM Right 12/23/2017   Procedure: AORTOGRAM, RIGHT LOWER EXTREMITY ANGIOGRAM, RIGHT SUPERFICIAL FEMORAL ARTERY ANGIOPLASTY WITH STENT, RIGHT POPLITEAL ARTERY ANGIOPLASTY WITH STENT, LEFT GROIN CUTDOWN AND LEFT FEMORAL ARTERY REPAIR;  Surgeon: [name], MD;  Location: [place];  Service: Vascular;  Laterality: Right;  . BACK SURGERY     x2, lumbar and cervical  . CARDIAC CATHETERIZATION    . CATARACT EXTRACTION Bilateral 12/12  . COLONOSCOPY    . ESOPHAGOGASTRODUODENOSCOPY ENDOSCOPY  04/2015  . IR GENERIC HISTORICAL  03/03/2016   IR IVC FILTER PLMT / S&I [name] GUID/MOD SED 03/03/2016 [name], MD [facility]  . IR GENERIC HISTORICAL  04/09/2016   IR RADIOLOGIST EVAL & MGMT 04/09/2016 [name], MD [facility] INTER[facility] RAD  . IR GENERIC HISTORICAL  04/30/2016   IR IVC FILTER RETRIEVAL / S&I  [name] GUID/MOD SED 04/30/2016 [name], MD [facility]-INTERV RAD  . LITHOTRIPSY    . LUMBAR LAMINECTOMY/DECOMPRESSION MICRODISCECTOMY Right 09/14/2012   Procedure: Right Lumbar three-four, four-five, Lumbar five-Sacral one decompressive laminectomy;  Surgeon: [name], MD;  Location: [place] [facility];  Service: Neurosurgery;  Laterality: Right;  . LUMBAR LAMINECTOMY/DECOMPRESSION MICRODISCECTOMY Left 03/11/2016   Procedure: Left Lumbar Two-ThreeMicrodiscectomy;  Surgeon: [name], MD;  Location: [place];  Service: Neurosurgery;  Laterality: Left;  . PERIPHERAL VASCULAR INTERVENTION  11/18/2016   Procedure: PERIPHERAL VASCULAR INTERVENTION;  Surgeon: [name], MD;  Location: [place] [facility];  Service: Cardiovascular;;  Left popliteal  . SHOULDER SURGERY Left 05/07/2009  . TONSILLECTOMY       Current Meds  Medication Sig  . acetaminophen (TYLENOL) 325 MG  tablet Take 1-2 tablets (325-650 mg total) by mouth every 4 (four) hours as needed for mild pain.  . brimonidine-timolol (COMBIGAN) 0.2-0.5 % ophthalmic solution Place 1 drop into both eyes 2 (two) times daily.   . cholecalciferol (VITAMIN D) 1000 units tablet Take 1,000 Units by mouth daily.   . diphenoxylate-atropine (LOMOTIL) 2.5-0.025 MG tablet Take 1 tablet by mouth every 6 (six) hours as needed for diarrhea or loose stools.  Marland Kitchen ELIQUIS 5 MG TABS tablet TAKE 1 TABLET (5 MG TOTAL) BY MOUTH 2 (TWO) TIMES DAILY.  . furosemide (LASIX) 40 MG tablet Take 0.5 tablets (20 mg total) by mouth daily.  Marland Kitchen gabapentin (NEURONTIN) 100 MG capsule Take one at night for one week and then take one twice daily.  Marland Kitchen imatinib (GLEEVEC) 100 MG tablet Take 300 mg by mouth daily with lunch. Take with noon meal and a large glass of water.Caution:Chemotherapy  . isosorbide mononitrate (IMDUR) 30 MG 24 hr tablet Take 1 tablet (30 mg total) by mouth daily.  Marland Kitchen lidocaine (LIDODERM) 5 % Place 1 patch onto the skin daily as needed (for back pain). Remove & Discard  patch within 12 hours or as directed by MD   . loperamide (IMODIUM) 2 MG capsule Take by mouth as needed for diarrhea or loose stools.  . Multiple Vitamin (MULTIVITAMIN) tablet Take 1 tablet by mouth daily.  . potassium chloride (K-DUR) 10 MEQ tablet Take 2 tablets (20 mEq total) by mouth daily.  . predniSONE (DELTASONE) 1 MG tablet TAKE 3 TABLETS BY MOUTH ONCE DAILY WITH BREAKFAST  . pyridostigmine (MESTINON) 60 MG tablet Take 60 mg by mouth daily.  . rosuvastatin (CRESTOR) 10 MG tablet Take 5 mg by mouth daily.  . tamsulosin (FLOMAX) 0.4 MG CAPS capsule Take 0.4 mg by mouth daily after supper.  . traZODone (DESYREL) 100 MG tablet Take 1 tablet (100 mg total) by mouth at bedtime.     Allergies:   Other; Phenergan [promethazine hcl]; Zofran [ondansetron hcl]; and Sulfamethoxazole   Social History   Tobacco Use  . Smoking status: Never Smoker  . Smokeless tobacco: Never Used  Substance Use Topics  . Alcohol use: No    Alcohol/week: 0.0 standard drinks  . Drug use: No     Family Hx: The patient's family history includes Breast cancer in his sister; Clotting disorder in his sister; Dementia in his sister; Heart attack in his father, mother, sister, sister, and sister; Heart disease in his father, mother, and sister; Stroke in his father and mother. There is no history of Colon cancer.  ROS:   Please see the history of present illness.     All other systems reviewed and are negative.   Prior CV studies:   The following studies were reviewed today:  ABIs 06/08/2018 Left: No significant change as compared to previous study. Atherosclerosis in the common femoral, femoral, and popliteal arteries. 50-74% stenosis in the proximal to mid superficial femoral artery  Arterial Dopplers/ABIs 12/23/17 Summary: Right: Resting right ankle-brachial index indicates noncompressible right lower extremity arteries. Waveform analysis is consistent with severe peripheral arterial disease. Left:  Resting left ankle-brachial index indicates noncompressible left lower extremity arteries. ABIs are unreliable. TBIs are unreliable. Waveform analysis is consistent with moderate peripheral arterial disease. Summary: Right: Technically challenging exam due to heavy medial calcinosis and acoustic shadowing from calcified plaque. Suspected occlusion of the distal right superficial femoral artery/popliteal artery with minimally reconstituted flow noted in the mid right  popliteal. Unable to rule out all  areas of stenosis/occlusion. The right peroneal and posterior tibial arteries have no detectable flow consistent with occlusion. One-vessel runoff via the anterior tibial artery.   Echo 07/20/2017 Mild concentric LVH, EF 50-55, normal wall motion, grade 1 diastolic dysfunction, mild aortic stenosis (mean 7 mmHg), trivial AI, mild MR, normal RVSF   Carotid US 02/26/2017 Final Interpretation: Right Carotid: Velocities in the right ICA are consistent with a 40-59% stenosis. Left Carotid: Velocities in the left ICA are consistent with a 1-39% stenosis. Vertebrals:  Right vertebral artery was patent with antegrade flow. No evidence              of flow detected in the left vertebral artery. Subclavians: Normal flow hemodynamics were seen in bilateral subclavian              arteries.   Nuclear stress test 09/27/2015 Low risk stress nuclear study with normal perfusion and normal left ventricular regional and global systolic function.   Cardiac catheterization 03/17/2001 ANGIOGRAPHIC FINDINGS: 1. The left main coronary artery has approximately 10-20% stenosis, but    no significant flow-limiting lesions. 2. The left anterior descending is a medium caliber vessel that provides    three diagonal branches. There is scattered 20% stenosis in the proximal    to mid vessel with 30% more distal stenosis followed by an area of    50-60% stenosis towards the apex. Flow is TIMI-3 throughout this vessel. 3. The  circumflex coronary artery essentially provides one large obtuse    marginal branch then has a 20% stenosis. 4. The right coronary artery is a dominant vessel that essentially has two    branches in the posterior descending distribution. There was a 30%    distal RCA stenosis and a focal 95% stenosis in the smaller of the two    branches in the posterior descending territory.   LEFT VENTRICULOGRAM: The left ventriculography reveals an ejection fraction estimated at 60-65% with no focal wall motion abnormalities and no mitral regurgitation.   DIAGNOSES: 1. Distal and branch vessel coronary artery disease as outlined. 2. Normal left ventricular contraction estimated at 60-65% with no    significant mitral regurgitation.  Labs/Other Tests and Data Reviewed:    EKG:  No ECG reviewed.  Recent Labs: 09/27/2017: TSH 0.51 10/06/2017: Pro B Natriuretic peptide (BNP) 73.0 11/06/2017: Magnesium 2.0 02/11/2018: ALT 19 03/16/2018: BUN 8; Creatinine, Ser 1.22; Hemoglobin 9.7; Platelets 254; Potassium 4.5; Sodium 141   Recent Lipid Panel Lab Results  Component Value Date/Time   CHOL 134 07/20/2017 05:25 AM   TRIG 166 (H) 07/20/2017 05:25 AM   HDL 33 (L) 07/20/2017 05:25 AM   CHOLHDL 4.1 07/20/2017 05:25 AM   LDLCALC 68 07/20/2017 05:25 AM   LDLDIRECT 59.8 10/12/2012 07:37 AM    Wt Readings from Last 3 Encounters:  07/15/18 145 lb (65.8 kg)  07/05/18 145 lb (65.8 kg)  04/14/18 156 lb (70.8 kg)     Objective:    Vital Signs:  BP 124/61   Pulse 62   Ht 5\' 6"  (1.676 m)   Wt 145 lb (65.8 kg)   BMI 23.40 kg/m    VITAL SIGNS:  reviewed GEN:  no acute distress EYES:  Sclera anicteric RESPIRATORY:  Normal respiratory effort CARDIOVASCULAR:  Left lower extremity edema noted NEURO:  Alert and oriented PSYCH:  normal affect  ASSESSMENT & PLAN:     Coronary artery disease involving native coronary artery of native heart without angina pectoris History of distal vessel CAD by  remote  cardiac catheterization.  Nuclear stress test in 2017 was low risk.  He has been managed medically.   He is doing well without angina.  He is not on aspirin as he is on Apixaban.  Continue rosuvastatin.  LDL in June 2019 was optimal.  PAD (peripheral artery disease) (H[facility]) Continue follow-up with Dr. Fletcher Anon.  Recent ABIs/arterial Dopplers with stable blood flow in the left leg.  Chronic diastolic CHF (congestive heart failure) ([place]) He does have some left leg swelling.  However, he denies significant shortness of breath.  I suspect some of his swelling is related to venous insufficiency.  He still getting used to his prosthesis and tends to carry more weight through his left side.  He has had some discomfort in that leg as well and is currently on Neurontin.  I have advised him to take 20 mg of Lasix daily and to continue taking extra 20 mg if needed for extra swelling.  I have also recommended that he use compression stockings and to keep his left leg elevated when seated.  History of pulmonary embolism He is on chronic anticoagulation with Apixaban.  Labs in February were stable.  Continue current therapy.  Essential hypertension The patient's blood pressure is controlled on his current regimen.  Continue current therapy.   COVID-19 Education: The signs and symptoms of COVID-19 were discussed with the patient and how to seek care for testing (follow up with PCP or arrange E-visit).  The importance of social distancing was discussed today.  Time:   Today, I have spent 15 minutes with the patient with telehealth technology discussing the above problems.     Medication Adjustments/Labs and Tests Ordered: Current medicines are reviewed at length with the patient today.  Concerns regarding medicines are outlined above.   Tests Ordered: No orders of the defined types were placed in this encounter.   Medication Changes: No orders of the defined types were placed in this encounter.    Disposition:  Follow up in 4 month(s) with Dr. Burt Knack or Richardson Dopp, PA-C   Signed, Richardson Dopp, PA-C  07/15/2018 1:57 PM    [place]

## 2018-07-15 ENCOUNTER — Telehealth (INDEPENDENT_AMBULATORY_CARE_PROVIDER_SITE_OTHER): Payer: Medicare Other | Admitting: Physician Assistant

## 2018-07-15 ENCOUNTER — Encounter: Payer: Self-pay | Admitting: Physician Assistant

## 2018-07-15 VITALS — BP 124/61 | HR 62 | Ht 66.0 in | Wt 145.0 lb

## 2018-07-15 DIAGNOSIS — Z86711 Personal history of pulmonary embolism: Secondary | ICD-10-CM | POA: Diagnosis not present

## 2018-07-15 DIAGNOSIS — I2510 Atherosclerotic heart disease of native coronary artery without angina pectoris: Secondary | ICD-10-CM

## 2018-07-15 DIAGNOSIS — I10 Essential (primary) hypertension: Secondary | ICD-10-CM | POA: Diagnosis not present

## 2018-07-15 DIAGNOSIS — I5032 Chronic diastolic (congestive) heart failure: Secondary | ICD-10-CM

## 2018-07-15 DIAGNOSIS — Z7189 Other specified counseling: Secondary | ICD-10-CM | POA: Diagnosis not present

## 2018-07-15 DIAGNOSIS — I739 Peripheral vascular disease, unspecified: Secondary | ICD-10-CM

## 2018-07-15 NOTE — Therapy (Signed)
[facility] [address] [place], [name], [address] Phone: [phone]   Fax:  [phone]  Physical Therapy Treatment  Patient Details  Name: [name] MRN: [id number] Date of Birth: 31-May-1940 Referring Provider (PT): Kathrynn Ducking, MD   Encounter Date: 07/14/2018  PT End of Session - 07/14/18 1102    Visit Number  7    Number of Visits  20    Date for PT Re-Evaluation  08/26/18    Authorization Type  Medicare & [employer]    PT Start Time  1057    PT Stop Time  1143    PT Time Calculation (min)  46 min    Equipment Utilized During Treatment  Gait belt    Activity Tolerance  Patient tolerated treatment well    Behavior During Therapy  [facility] for tasks assessed/performed       Past Medical History:  Diagnosis Date  . Bruises easily    d/t being on Eliquis  . Carotid artery disease ([place])    a. Duplex 05/2014: 07-68% RICA, 0-88% LICA, elevated velocities in right subclavian artery, normal left subclavian artery..  . Chronic back pain    HNP  . Chronic kidney disease (CKD)   . Claustrophobia    takes Ativan as needed  . 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 Coronary atherosclerosis of unspecified type of vessel, native or graft    a. cath in 2003 showed 10-20% LM, scattered 20% prox-mid LAD, 30% more distal LAD, 50-60% stenosis of LAD towards apex, 20% Cx, 30% dRCA, focal 95% stenosis in smaller of 2 branches of PDA, EF 60-65%.  . Diabetes mellitus ([place])   . Diarrhea    takes Imodium daily as needed  . Diverticulosis   . Diverticulosis of colon (without mention of hemorrhage)   . DJD (degenerative joint disease)   . DVT (deep venous thrombosis) ([place]) 07/2011  . Esophageal stricture   . Essential hypertension    takes Imdur and Lisinopril daily  . Gastric polyps    benign  . GERD (gastroesophageal reflux disease)    takes Nexium daily  . Glaucoma    uses eye drops  . History of  bronchitis    not sure when the last time  . History of colon polyps    benign  . History of kidney stones   . History of kidney stones   . Insomnia    takes Melatonin nightly as needed  . Mixed hyperlipidemia    takes Crestor daily  . Myasthenia gravis ([place])    uses prednisone  . Osteoporosis 2016  . Peripheral edema    takes Lasix daily  . Pituitary tumor    checks Dr.Walden at [facility] checks it every Jan   . Pneumonia 01/2016  . PONV (postoperative nausea and vomiting)   . Ptosis   . Pulmonary embolus ([place]) 2012  . Stroke [facility]) 06/2015    Past Surgical History:  Procedure Laterality Date  . ABDOMINAL AORTOGRAM W/LOWER EXTREMITY N/A 11/18/2016   Procedure: ABDOMINAL AORTOGRAM W/LOWER EXTREMITY;  Surgeon: [name], MD;  Location: [place] [facility];  Service: Cardiovascular;  Laterality: N/A;  . AMPUTATION Right 02/07/2018   Procedure: right leg AMPUTATION ABOVE KNEE;  Surgeon: [name], MD;  Location: [place];  Service: Vascular;  Laterality: Right;  . AORTOGRAM Right 12/23/2017   Procedure: AORTOGRAM, RIGHT LOWER EXTREMITY ANGIOGRAM, RIGHT SUPERFICIAL FEMORAL ARTERY ANGIOPLASTY WITH STENT,  RIGHT POPLITEAL ARTERY ANGIOPLASTY WITH STENT, LEFT GROIN CUTDOWN AND LEFT FEMORAL ARTERY REPAIR;  Surgeon: [name], MD;  Location: [place];  Service: Vascular;  Laterality: Right;  . BACK SURGERY     x2, lumbar and cervical  . CARDIAC CATHETERIZATION    . CATARACT EXTRACTION Bilateral 12/12  . COLONOSCOPY    . ESOPHAGOGASTRODUODENOSCOPY ENDOSCOPY  04/2015  . IR GENERIC HISTORICAL  03/03/2016   IR IVC FILTER PLMT / S&I [name] GUID/MOD SED 03/03/2016 [name], MD [facility]INTER[facility] RAD  . IR GENERIC HISTORICAL  04/09/2016   IR RADIOLOGIST EVAL & MGMT 04/09/2016 [name], MD [facility] INTER[facility] RAD  . IR GENERIC HISTORICAL  04/30/2016   IR IVC FILTER RETRIEVAL / S&I [name] GUID/MOD SED 04/30/2016 [name], MD [facility]-INTERV RAD  . LITHOTRIPSY    . LUMBAR  LAMINECTOMY/DECOMPRESSION MICRODISCECTOMY Right 09/14/2012   Procedure: Right Lumbar three-four, four-five, Lumbar five-Sacral one decompressive laminectomy;  Surgeon: [name], MD;  Location: [place] [facility];  Service: Neurosurgery;  Laterality: Right;  . LUMBAR LAMINECTOMY/DECOMPRESSION MICRODISCECTOMY Left 03/11/2016   Procedure: Left Lumbar Two-ThreeMicrodiscectomy;  Surgeon: [name], MD;  Location: [place];  Service: Neurosurgery;  Laterality: Left;  . PERIPHERAL VASCULAR INTERVENTION  11/18/2016   Procedure: PERIPHERAL VASCULAR INTERVENTION;  Surgeon: [name], MD;  Location: [place] [facility];  Service: Cardiovascular;;  Left popliteal  . SHOULDER SURGERY Left 05/07/2009  . TONSILLECTOMY      There were no vitals filed for this visit.  Subjective Assessment - 07/14/18 1058    Subjective  Has been using the ice massage and doing the stretches at home with some relief of muscle pain. Still tender/sore and unable to lift leg when sitting.     Patient is accompained by:  Family member   spouse, Ivon Oelkers   Pertinent History   Back surgery X 4; Tonsillectomy; Cataract extraction (Bilateral, 12/12); Shoulder surgery (Left, 05/07/2009); Cardiac catheterization; Lumbar laminectomy/decompression microdiscectomy (Right, 09/14/2012); Esophagogastroduodenoscopy endoscopy (04/2015); ir generic historical (03/03/2016); Colonoscopy; Lithotripsy; Lumbar laminectomy/decompression microdiscectomy (Left, 03/11/2016); ir generic historical (04/09/2016); ir generic historical (04/30/2016); ABDOMINAL AORTOGRAM W/LOWER EXTREMITY (N/A, 11/18/2016); PERIPHERAL VASCULAR INTERVENTION (11/18/2016); Aortogram (Right, 12/23/2017); and Transfemoral Amputation (Right, 02/07/2018).    Limitations  Lifting;Standing;Walking;House hold activities    Patient Stated Goals  He wants to walk with prosthesis, get around house & back in community. Try golf again.     Currently in Pain?  Yes    Pain Score  5     Pain  Location  Leg   into groin and down left inner thigh   Pain Orientation  Left;Medial    Pain Descriptors / Indicators  Sore    Pain Type  Acute pain    Pain Onset  1 to 4 weeks ago    Pain Frequency  Constant    Aggravating Factors   moving or lifting leg    Pain Relieving Factors  sittng and relaxion, ice massage,     Effect of Pain on Daily Activities  limiting standing           [facility] Adult PT Treatment/Exercise - 07/14/18 1107      Transfers   Transfers  Sit to Stand;Stand to [employer] Transfers    Sit to Stand  5: Supervision;With upper extremity assist;With armrests;From chair/3-in-1;4: Min assist    Sit to Stand Details  Verbal cues for technique;Visual cues/gestures for sequencing    Sit to Stand Details (indicate cue type and reason)  needs UE  support for stability in standing    Stand to Sit  5: Supervision;With upper extremity assist;With armrests;To chair/3-in-1    Stand to Sit Details (indicate cue type and reason)  Verbal cues for technique;Visual cues/gestures for sequencing;Visual cues for safe use of DME/AE    Stand to Sit Details  needs UE support while sitting for stability    Stand Pivot Transfers  5: Supervision    Stand Pivot Transfer Details (indicate cue type and reason)  cues on posture and squencing with RW/prosthesis from wheelchair to [employer]       Ambulation/Gait   Ambulation/Gait  Yes    Ambulation/Gait Assistance  4: Min guard;4: Min assist    Ambulation/Gait Assistance Details  cues to slow down, weight shifting and for walker position as pt tends to get too close to walker.     Ambulation Distance (Feet)  40 Feet   x1, 70 x1   Assistive device  Rolling walker;Prosthesis    Gait Pattern  Step-to pattern;Decreased step length - left;Decreased stance time - right;Decreased stride length;Decreased hip/knee flexion - right;Decreased weight shift to right;Right hip hike;Right circumduction;Antalgic;Lateral hip instability;Trunk flexed;Narrow base of  support;Poor foot clearance - right    Ambulation Surface  Level;Indoor      Knee/Hip Exercises: Aerobic   Other Aerobic  Scifit with UE/LE's at level 1.0 for 8 minutes with goal >/=60 rpm for strengthening adn activity tolerance.       Manual Therapy   Manual therapy comments  ice massage to left inner thigh from groin down to knee for decreased pain to allow for increased activity today      Prosthetics   Current prosthetic wear tolerance (days/week)   daily    Current prosthetic wear tolerance (#hours/day)   wore it for 10 hours yesterday (liner only) with break about mid way.     Residual limb condition   intact with no issues per pt and spouse.     Education Provided  Residual limb care;Proper wear schedule/adjustment;Proper weight-bearing schedule/adjustment    Person(s) Educated  Patient;Spouse    Education Method  Explanation;Demonstration;Verbal cues    Education Method  Verbalized understanding;Verbal cues required;Needs further [place] Prosthesis  Supervision         PT Short Term Goals - 06/30/18 1519      PT SHORT TERM GOAL #1   Title  Patient demonstrates proper donning including tightening suspension strap safely without assistance or cues. (All STGs Target Date: 07/29/2018)    Time  1    Period  Months    Status  Revised    Target Date  07/29/18      PT SHORT TERM GOAL #2   Title  Patient tolerates wear of prosthesis >8hrs total /day without skin or limb pain issues.     Time  1    Period  Months    Status  On-going    Target Date  07/29/18      PT SHORT TERM GOAL #3   Title  Standing balance with RW reaches 7" and scans environment with head & trunk movements safely with no loss of balance.     Time  1    Period  Months    Status  Revised    Target Date  07/29/18      PT SHORT TERM GOAL #4   Title  Patient ambulates 150' with RW & prosthesis with supervision.     Time  1    Period  Months    Status  Revised    Target Date  07/29/18       PT SHORT TERM GOAL #5   Title  Patient negotiates stairs with 2 rails, ramps & curbs with RW & prosthesis with minA.     Time  1    Period  Months    Status  New    Target Date  07/29/18        PT Long Term Goals - 06/16/18 2101      PT LONG TERM GOAL #1   Title  Patient & wife verbalize & demonstrate understanding of proper prosthetic care to enable safe use of prosthesis. (All LTGs Target Date: 08/26/2018)    Time  12    Period  Weeks    Status  On-going      PT LONG TERM GOAL #2   Title  Patient tolerates wearing prosthesis >90% of awake hours without skin or limb pain issues to enable function throughout his day.     Time  12    Period  Weeks    Status  On-going      PT LONG TERM GOAL #3   Title  Patient standing balance with RW support modified independent: reaching 10" anteriorly, pick up item from floor & scans environment.     Time  12    Period  Weeks    Status  On-going      PT LONG TERM GOAL #4   Title  Patient ambulates 300' with RW & prosthesis modified independent to enable community mobility.     Time  12    Period  Weeks    Status  On-going      PT LONG TERM GOAL #5   Title  Patient negotiates ramps & curbs with RW & prosthesis modified independent to enable community access.     Time  12    Period  Weeks    Status  On-going            Plan - 07/14/18 1103    Clinical Impression Statement  Today's skilled session continued to address pain reduction of muscle strain on left LE, prosthetic management, strengthening and gait with RW/prosthesis. Mild increase in pain reported with gait that resolved with rest breaks. The pt is progressing toward goals and should benefit from continued PT to progress toward unmet goals.     Personal Factors and Comorbidities  Age;Comorbidity 3+;Fitness;Past/Current Experience    Comorbidities  Back surgery x 4; Tonsillectomy; Cataract extraction (Bilateral, 12/12); Shoulder surgery (Left, 05/07/2009); Cardiac  catheterization; Lumbar laminectomy/decompression microdiscectomy (Right, 09/14/2012); Esophagogastroduodenoscopy endoscopy (04/2015); ir generic historical (03/03/2016); Colonoscopy; Lithotripsy; Lumbar laminectomy/decompression microdiscectomy (Left, 03/11/2016); ir generic historical (04/09/2016); ir generic historical (04/30/2016); ABDOMINAL AORTOGRAM W/LOWER EXTREMITY (N/A, 11/18/2016); PERIPHERAL VASCULAR INTERVENTION (11/18/2016); Aortogram (Right, 12/23/2017); and Amputation Transfemoral (Right, 02/07/2018).    Examination-Activity Limitations  Hygiene/Grooming;Lift;Locomotion Level;Reach Overhead;Stairs;Stand;Toileting;Transfers    Examination-Participation Restrictions  Church;Community Activity;Driving    Stability/Clinical Decision Making  Evolving/Moderate complexity    Rehab Potential  Good    PT Frequency  2x / week    PT Duration  12 weeks    PT Treatment/Interventions  ADLs/Self Care Home Management;Stair training;Gait training;DME Instruction;Functional mobility training;Therapeutic activities;Therapeutic exercise;Balance training;Neuromuscular re-education;Patient/family education;Prosthetic Training;Manual techniques;Vestibular    PT Next Visit Plan  work towards updated STGs, prosthetic gait including stairs, ramps & curbs    Consulted and Agree with Plan of Care  Patient;Family member/caregiver    Family Member Consulted  wife, Hassan Rowan  [place]       Patient will benefit from skilled therapeutic intervention in order to improve the following deficits and impairments:  Abnormal gait, Decreased activity tolerance, Decreased balance, Decreased endurance, Decreased knowledge of use of DME, Decreased mobility, Decreased range of motion, Decreased strength, Impaired flexibility, Prosthetic Dependency, Postural dysfunction  Visit Diagnosis: Muscle weakness (generalized)  Abnormal posture  Unsteadiness on feet  Other abnormalities of gait and mobility     Problem List Patient Active  Problem List   Diagnosis Date Noted  . Neuropathic pain of right ankle 06/27/2018  . Insomnia due to medical condition 04/13/2018  . Atherosclerosis of native coronary artery of native heart without angina pectoris   . Hypokalemia   . Stage 3 chronic kidney disease ([place])   . 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 Acute blood loss anemia 02/08/2018  . Post-operative pain   . Unilateral AKA, right ([place])   . Ischemic foot 02/05/2018  . Ischemia of right lower extremity   . Pressure injury of skin 12/27/2017  . Arterial occlusive disease   . Postoperative pain   . CKD (chronic kidney disease), stage III ([place])   . History of CVA (cerebrovascular accident)   . Femoral artery thrombosis, right ([place]) 12/23/2017  . Septic arthritis of left sternoclavicular joint ([place]) 11/03/2017  . Cervical spinal cord compression ([place]) 10/30/2017  . Nasal abscess 09/25/2017  . Immunocompromised ([place]) 09/25/2017  . Acute on chronic blood loss anemia 08/05/2017  . Transient ischemic attack (TIA) 07/23/2017  . Subcortical infarction ([place])   . Right hemiparesis ([place])   . Ataxia 07/21/2017  . TIA (transient ischemic attack)   . Wheezing 06/17/2017  . Bursitis of left elbow 05/01/2017  . Dystonia 12/29/2016  . PAD (peripheral artery disease) ([place]) 11/18/2016  . Osteoporosis 08/28/2016  . Paronychia of great toe, left 08/10/2016  . Carotid arterial disease ([place]) 07/09/2016  . Hypocalcemia 07/09/2016  . Diplopia 07/02/2016  . S/P lumbar laminectomy 03/11/2016  . Radicular pain of left lower extremity 02/21/2016  . Cough 01/10/2016  . 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 Myasthenia gravis ([place])   . Diabetes mellitus type 2 in nonobese (HCC)   . Ataxia, post-stroke   . Gait disturbance, post-stroke   . Proximal leg weakness   . Cervical spondylosis without myelopathy   . Achilles tendinitis of right lower extremity   . Chronic diastolic CHF (congestive heart failure) ([place])   .  Gastroesophageal reflux disease without esophagitis   . 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 Cerebellar stroke ([place])   . Ischemic stroke ([place])   . Cerebrovascular accident (CVA) due to thrombosis of left vertebral artery ([place])   . History of pulmonary embolus ([place])   . Chronic anticoagulation   . HLD (hyperlipidemia)   . Neck pain 03/19/2015  . Pain in the chest   . 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[place])   . Diabetic gastroparesis ([place]) 03/07/2015  . Screening for osteoporosis 07/19/2014  . Dyspnea 04/09/2014  . Edema 04/09/2014  . Routine general medical examination at a health care facility 10/23/2013  . Bilateral carotid bruits 05/24/2013  . Abnormal CT scan, lung 05/24/2013  . Encounter for therapeutic drug monitoring 03/14/2013  . Lymphadenopathy, inguinal 01/11/2013  . AKI (acute kidney injury) ([place]) 10/28/2012  . 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 Myasthenia gravis with exacerbation ([place]) 03/31/2012  . Disease of salivary gland 12/09/2011  . Left facial  pain 10/28/2011  . Pulmonary embolism ([place]) 10/25/2011  . 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 GERD with stricture 09/23/2011  . Long term (current) use of anticoagulants 08/03/2011  . History of pulmonary embolism 07/30/2011  . History of DVT (deep vein thrombosis) 07/30/2011  . Subconjunctival hemorrhage 06/22/2011  . Bilateral conjunctivitis 04/09/2011  . Diabetes mellitus type 2, controlled ([place]) 12/18/2010  . Hearing loss 08/04/2010  . Ptosis of eyelid 12/30/2009  . Essential hypertension 04/24/2009  . HYPERLIPIDEMIA, MIXED 10/05/2008  . HEMORRHOIDS-INTERNAL 07/02/2008  . HEMORRHOIDS-EXTERNAL 07/02/2008  . DIVERTICULOSIS-COLON 07/02/2008  . PERSONAL HX COLONIC POLYPS 07/02/2008  . RASH-NONVESICULAR 02/07/2008  . PNEUMONIA 12/08/2007  . Chronic myeloid leukemia in remission ([place]) 07/07/2007  . CAD (coronary artery disease) 07/07/2007  .  NEPHROLITHIASIS, HX OF 07/07/2007    Willow Ora, PTA, [facility] [address], [place], [place] [address] [phone] 07/15/18, 4:44 PM   Name: JYDEN KROMER MRN: [id number] Date of Birth: 1941-01-22

## 2018-07-15 NOTE — Patient Instructions (Signed)
Medication Instructions:  Your physician recommends that you continue on your current medications as directed. Please refer to the Current Medication list given to you today.  If you need a refill on your cardiac medications before your next appointment, please call your pharmacy.   Lab work:   If you have labs (blood work) drawn today and your tests are completely normal, you will receive your results only by: Marland Kitchen MyChart Message (if you have MyChart) OR . A paper copy in the mail If you have any lab test that is abnormal or we need to change your treatment, we will call you to review the results.  Testing/Procedures:NONE ORDERED  TODAY    Follow-Up: At [facility], you and your health needs are our priority.  As part of our continuing mission to provide you with exceptional heart care, we have created designated Provider Care Teams.  These Care Teams include your primary Cardiologist (physician) and Advanced Practice Providers (APPs -  Physician Assistants and Nurse Practitioners) who all work together to provide you with the care you need, when you need it. You will need a follow up appointment in:  4 months.   You may see Sherren Mocha, MD or one of the following Advanced Practice Providers on your designated Care Team: Richardson Dopp, PA-C [place], [place] . Daune Perch, NP  Any Other Special Instructions Will Be Listed Below (If Applicable).

## 2018-07-16 ENCOUNTER — Other Ambulatory Visit: Payer: Self-pay | Admitting: Neurology

## 2018-07-19 ENCOUNTER — Other Ambulatory Visit: Payer: Self-pay

## 2018-07-19 ENCOUNTER — Ambulatory Visit: Payer: Medicare Other | Admitting: Physical Therapy

## 2018-07-19 ENCOUNTER — Encounter: Payer: Self-pay | Admitting: Physical Therapy

## 2018-07-19 DIAGNOSIS — M6281 Muscle weakness (generalized): Secondary | ICD-10-CM

## 2018-07-19 DIAGNOSIS — Z9181 History of falling: Secondary | ICD-10-CM | POA: Diagnosis not present

## 2018-07-19 DIAGNOSIS — R2681 Unsteadiness on feet: Secondary | ICD-10-CM

## 2018-07-19 DIAGNOSIS — R293 Abnormal posture: Secondary | ICD-10-CM | POA: Diagnosis not present

## 2018-07-19 DIAGNOSIS — R2689 Other abnormalities of gait and mobility: Secondary | ICD-10-CM

## 2018-07-20 NOTE — Therapy (Signed)
[facility] [address] [place], [name], [address] Phone: [phone]   Fax:  [phone]  Physical Therapy Treatment  Patient Details  Name: [name] MRN: [id number] Date of Birth: [birth year] Referring Provider (PT): Kathrynn Ducking, MD   Encounter Date: 07/19/2018   CLINIC OPERATION CHANGES: [facility] is open at lower capacity following universal masking, social distancing, and patient screening.  The patient's COVID risk of complications score is 9.  PT End of Session - 07/19/18 1819    Visit Number  8    Number of Visits  20    Date for PT Re-Evaluation  08/26/18    Authorization Type  Medicare & [employer]    PT Start Time  1100    PT Stop Time  1145    PT Time Calculation (min)  45 min    Equipment Utilized During Treatment  Gait belt    Activity Tolerance  Patient tolerated treatment well    Behavior During Therapy  WFL for tasks assessed/performed       Past Medical History:  Diagnosis Date  . Bruises easily    d/t being on Eliquis  . Carotid artery disease ([place])    a. Duplex 05/2014: 69-62% RICA, 9-52% LICA, elevated velocities in right subclavian artery, normal left subclavian artery..  . Chronic back pain    HNP  . Chronic kidney disease (CKD)   . Claustrophobia    takes Ativan as needed  . 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 Coronary atherosclerosis of unspecified type of vessel, native or graft    a. cath in 2003 showed 10-20% LM, scattered 20% prox-mid LAD, 30% more distal LAD, 50-60% stenosis of LAD towards apex, 20% Cx, 30% dRCA, focal 95% stenosis in smaller of 2 branches of PDA, EF 60-65%.  . Diabetes mellitus ([place])   . Diarrhea    takes Imodium daily as needed  . Diverticulosis   . Diverticulosis of colon (without mention of hemorrhage)   . DJD (degenerative joint disease)   . DVT (deep venous thrombosis) ([place]) 07/2011  . Esophageal stricture    . Essential hypertension    takes Imdur and Lisinopril daily  . Gastric polyps    benign  . GERD (gastroesophageal reflux disease)    takes Nexium daily  . Glaucoma    uses eye drops  . History of bronchitis    not sure when the last time  . History of colon polyps    benign  . History of kidney stones   . History of kidney stones   . Insomnia    takes Melatonin nightly as needed  . Mixed hyperlipidemia    takes Crestor daily  . Myasthenia gravis ([place])    uses prednisone  . Osteoporosis 2016  . Peripheral edema    takes Lasix daily  . Pituitary tumor    checks Dr[facility]Walden at [facility] checks it every Jan   . Pneumonia 01/2016  . PONV (postoperative nausea and vomiting)   . Ptosis   . Pulmonary embolus ([place]) 2012  . Stroke [facility]) 06/2015    Past Surgical History:  Procedure Laterality Date  . ABDOMINAL AORTOGRAM W/LOWER EXTREMITY N/A 11/18/2016   Procedure: ABDOMINAL AORTOGRAM W/LOWER EXTREMITY;  Surgeon: [name], MD;  Location: [facility];  Service: Cardiovascular;  Laterality: N/A;  . AMPUTATION Right 02/07/2018   Procedure: right leg AMPUTATION ABOVE KNEE;  Surgeon: [name],  MD;  Location: [facility];  Service: Vascular;  Laterality: Right;  . AORTOGRAM Right 12/23/2017   Procedure: AORTOGRAM, RIGHT LOWER EXTREMITY ANGIOGRAM, RIGHT SUPERFICIAL FEMORAL ARTERY ANGIOPLASTY WITH STENT, RIGHT POPLITEAL ARTERY ANGIOPLASTY WITH STENT, LEFT GROIN CUTDOWN AND LEFT FEMORAL ARTERY REPAIR;  Surgeon: [name], MD;  Location: [facility];  Service: Vascular;  Laterality: Right;  . BACK SURGERY     x2, lumbar and cervical  . CARDIAC CATHETERIZATION    . CATARACT EXTRACTION Bilateral 12/12  . COLONOSCOPY    . ESOPHAGOGASTRODUODENOSCOPY ENDOSCOPY  04/2015  . IR GENERIC HISTORICAL  03/03/2016   IR IVC FILTER PLMT / S&I [name] GUID/MOD SED 03/03/2016 [name], MD [facility]  . IR GENERIC HISTORICAL  04/09/2016   IR RADIOLOGIST EVAL & MGMT 04/09/2016  [name], MD [facility] INTER[facility] RAD  . IR GENERIC HISTORICAL  04/30/2016   IR IVC FILTER RETRIEVAL / S&I [name] GUID/MOD SED 04/30/2016 [name], MD [facility]INTER[facility] RAD  . LITHOTRIPSY    . LUMBAR LAMINECTOMY/DECOMPRESSION MICRODISCECTOMY Right 09/14/2012   Procedure: Right Lumbar three-four, four-five, Lumbar five-Sacral one decompressive laminectomy;  Surgeon: [name], MD;  Location: [facility];  Service: Neurosurgery;  Laterality: Right;  . LUMBAR LAMINECTOMY/DECOMPRESSION MICRODISCECTOMY Left 03/11/2016   Procedure: Left Lumbar Two-ThreeMicrodiscectomy;  Surgeon: [name], MD;  Location: [facility];  Service: Neurosurgery;  Laterality: Left;  . PERIPHERAL VASCULAR INTERVENTION  11/18/2016   Procedure: PERIPHERAL VASCULAR INTERVENTION;  Surgeon: [name], MD;  Location: [facility];  Service: Cardiovascular;;  Left popliteal  . SHOULDER SURGERY Left 05/07/2009  . TONSILLECTOMY      There were no vitals filed for this visit.  Subjective Assessment - 07/19/18 1054    Subjective  He has wearing liner 7-10 hrs with break and prosthesis 3 hrs 2x/day. His left hip is 80% better. He has been icing area.     Patient is accompained by:  Family member   spouse, Cypress Fanfan   Pertinent History   Back surgery X 4; Tonsillectomy; Cataract extraction (Bilateral, 12/12); Shoulder surgery (Left, 05/07/2009); Cardiac catheterization; Lumbar laminectomy/decompression microdiscectomy (Right, 09/14/2012); Esophagogastroduodenoscopy endoscopy (04/2015); ir generic historical (03/03/2016); Colonoscopy; Lithotripsy; Lumbar laminectomy/decompression microdiscectomy (Left, 03/11/2016); ir generic historical (04/09/2016); ir generic historical (04/30/2016); ABDOMINAL AORTOGRAM W/LOWER EXTREMITY (N/A, 11/18/2016); PERIPHERAL VASCULAR INTERVENTION (11/18/2016); Aortogram (Right, 12/23/2017); and Transfemoral Amputation (Right, 02/07/2018).    Limitations  Lifting;Standing;Walking;House hold activities     Patient Stated Goals  He wants to walk with prosthesis, get around house & back in community. Try golf again.     Pain Score  3     Pain Location  Hip    Pain Orientation  Left    Pain Descriptors / Indicators  Aching;Sore    Pain Type  Acute pain    Pain Onset  1 to 4 weeks ago    Pain Frequency  Intermittent    Aggravating Factors   lifting left leg in sitting    Pain Relieving Factors  resting it & ice                       [facility] Adult PT Treatment/Exercise - 07/19/18 1100      Transfers   Transfers  Sit to Stand;Stand to [employer] Transfers    Sit to Stand  5: Supervision;4: Min assist;With upper extremity assist;With armrests;From chair/3-in-1   5 reps from chairs without armrests MinA   Sit to Stand Details  Verbal cues for  technique;Visual cues/gestures for sequencing    Stand to Sit  5: Supervision;4: Min guard;With upper extremity assist;With armrests;To chair/3-in-1   5 reps from chairs without armrests Min guard   Stand to Sit Details (indicate cue type and reason)  Verbal cues for technique;Visual cues/gestures for sequencing;Visual cues for safe use of DME/AE    Stand Pivot Transfers  4: Min assist   transfer into [place] with high seat with RW     Ambulation/Gait   Ambulation/Gait  Yes    Ambulation/Gait Assistance  4: Min guard;4: Min assist;5: Supervision    Ambulation/Gait Assistance Details  verbal & demo cues on wt shift over prosthesis in stance, initial contact with heel & proper step length and upright posture    Ambulation Distance (Feet)  80 Feet   65' indoors & 52' indoor to outdoor on pavement   Assistive device  Rolling walker;Prosthesis    Gait Pattern  Step-to pattern;Decreased step length - left;Decreased stance time - right;Decreased stride length;Decreased hip/knee flexion - right;Decreased weight shift to right;Right hip hike;Right circumduction;Antalgic;Lateral hip instability;Trunk flexed;Narrow base of support;Poor foot clearance -  right    Ambulation Surface  Indoor;Level;Outdoor;Paved    Stairs  Yes    Stairs Assistance  3: Mod assist    Stairs Assistance Details (indicate cue type and reason)  demo & verbal cues on technique, sequence & wt shift    Stair Management Technique  Two rails;Step to pattern;Forwards    Number of Stairs  4    Height of Stairs  6    Ramp  3: Mod assist   RW & prosthesis   Ramp Details (indicate cue type and reason)  PT demo, verbal & tactile cues on technique, wt shift & upright posture    Curb  3: Mod assist;4: Min assist   RW & prosthesis, 2 reps 1st ModA & 2nd MinA   Curb Details (indicate cue type and reason)  PT demo, verbal & tactile cues on technique, wt shift & upright posture      Knee/Hip Exercises: Aerobic   Other Aerobic  --      Manual Therapy   Manual therapy comments  --      Prosthetics   Current prosthetic wear tolerance (days/week)   daily    Current prosthetic wear tolerance (#hours/day)   liner 10hrs total /day with prosthesis 3 hrs 2x/day    Residual limb condition   intact with no open areas,     Education Provided  Residual limb care;Proper wear schedule/adjustment;Proper Donning    Person(s) Educated  Patient;Spouse    Education Method  Explanation;Demonstration;Tactile cues;Verbal cues    Education Method  Verbalized understanding;Needs further instruction    Donning Prosthesis  Supervision               PT Short Term Goals - 06/30/18 1519      PT SHORT TERM GOAL #1   Title  Patient demonstrates proper donning including tightening suspension strap safely without assistance or cues. (All STGs Target Date: 07/29/2018)    Time  1    Period  Months    Status  Revised    Target Date  07/29/18      PT SHORT TERM GOAL #2   Title  Patient tolerates wear of prosthesis >8hrs total /day without skin or limb pain issues.     Time  1    Period  Months    Status  On-going    Target Date  07/29/18  PT SHORT TERM GOAL #3   Title  Standing balance  with RW reaches 7" and scans environment with head & trunk movements safely with no loss of balance.     Time  1    Period  Months    Status  Revised    Target Date  07/29/18      PT SHORT TERM GOAL #4   Title  Patient ambulates 150' with RW & prosthesis with supervision.     Time  1    Period  Months    Status  Revised    Target Date  07/29/18      PT SHORT TERM GOAL #5   Title  Patient negotiates stairs with 2 rails, ramps & curbs with RW & prosthesis with minA.     Time  1    Period  Months    Status  New    Target Date  07/29/18        PT Long Term Goals - 06/16/18 2101      PT LONG TERM GOAL #1   Title  Patient & wife verbalize & demonstrate understanding of proper prosthetic care to enable safe use of prosthesis. (All LTGs Target Date: 08/26/2018)    Time  12    Period  Weeks    Status  On-going      PT LONG TERM GOAL #2   Title  Patient tolerates wearing prosthesis >90% of awake hours without skin or limb pain issues to enable function throughout his day.     Time  12    Period  Weeks    Status  On-going      PT LONG TERM GOAL #3   Title  Patient standing balance with RW support modified independent: reaching 10" anteriorly, pick up item from floor & scans environment.     Time  12    Period  Weeks    Status  On-going      PT LONG TERM GOAL #4   Title  Patient ambulates 300' with RW & prosthesis modified independent to enable community mobility.     Time  12    Period  Weeks    Status  On-going      PT LONG TERM GOAL #5   Title  Patient negotiates ramps & curbs with RW & prosthesis modified independent to enable community access.     Time  12    Period  Weeks    Status  On-going            Plan - 07/19/18 1820    Clinical Impression Statement  PT introduced stairs, curbs & ramps today. He needs addtional training prior to performing with wife. He required less assistance for level surface prosthetic gait.     Personal Factors and Comorbidities   Age;Comorbidity 3+;Fitness;Past/Current Experience    Comorbidities  Back surgery x 4; Tonsillectomy; Cataract extraction (Bilateral, 12/12); Shoulder surgery (Left, 05/07/2009); Cardiac catheterization; Lumbar laminectomy/decompression microdiscectomy (Right, 09/14/2012); Esophagogastroduodenoscopy endoscopy (04/2015); ir generic historical (03/03/2016); Colonoscopy; Lithotripsy; Lumbar laminectomy/decompression microdiscectomy (Left, 03/11/2016); ir generic historical (04/09/2016); ir generic historical (04/30/2016); ABDOMINAL AORTOGRAM W/LOWER EXTREMITY (N/A, 11/18/2016); PERIPHERAL VASCULAR INTERVENTION (11/18/2016); Aortogram (Right, 12/23/2017); and Amputation Transfemoral (Right, 02/07/2018).    Examination-Activity Limitations  Hygiene/Grooming;Lift;Locomotion Level;Reach Overhead;Stairs;Stand;Toileting;Transfers    Examination-Participation Restrictions  Church;Community Activity;Driving    Stability/Clinical Decision Making  Evolving/Moderate complexity    Rehab Potential  Good    PT Frequency  2x / week    PT Duration  12 weeks    PT Treatment/Interventions  ADLs/Self Care Home Management;Stair training;Gait training;DME Instruction;Functional mobility training;Therapeutic activities;Therapeutic exercise;Balance training;Neuromuscular re-education;Patient/family education;Prosthetic Training;Manual techniques;Vestibular    PT Next Visit Plan  work towards updated STGs, prosthetic gait including stairs, ramps & curbs and outdoors on paved surfaces    Consulted and Agree with Plan of Care  Patient;Family member/caregiver    Family Member Consulted  wife, Quantel Mcinturff       Patient will benefit from skilled therapeutic intervention in order to improve the following deficits and impairments:  Abnormal gait, Decreased activity tolerance, Decreased balance, Decreased endurance, Decreased knowledge of use of DME, Decreased mobility, Decreased range of motion, Decreased strength, Impaired flexibility,  Prosthetic Dependency, Postural dysfunction  Visit Diagnosis: Muscle weakness (generalized)  Abnormal posture  Unsteadiness on feet  Other abnormalities of gait and mobility  History of falling     Problem List Patient Active Problem List   Diagnosis Date Noted  . Neuropathic pain of right ankle 06/27/2018  . Insomnia due to medical condition 04/13/2018  . Atherosclerosis of native coronary artery of native heart without angina pectoris   . Hypokalemia   . Stage 3 chronic kidney disease ([place])   . 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 Acute blood loss anemia 02/08/2018  . Post-operative pain   . Unilateral AKA, right ([place])   . Ischemic foot 02/05/2018  . Ischemia of right lower extremity   . Pressure injury of skin 12/27/2017  . Arterial occlusive disease   . Postoperative pain   . CKD (chronic kidney disease), stage III ([place])   . History of CVA (cerebrovascular accident)   . Femoral artery thrombosis, right ([place]) 12/23/2017  . Septic arthritis of left sternoclavicular joint ([place]) 11/03/2017  . Cervical spinal cord compression ([place]) 10/30/2017  . Nasal abscess 09/25/2017  . Immunocompromised ([place]) 09/25/2017  . Acute on chronic blood loss anemia 08/05/2017  . Transient ischemic attack (TIA) 07/23/2017  . Subcortical infarction ([place])   . Right hemiparesis ([place])   . Ataxia 07/21/2017  . TIA (transient ischemic attack)   . Wheezing 06/17/2017  . Bursitis of left elbow 05/01/2017  . Dystonia 12/29/2016  . PAD (peripheral artery disease) ([place]) 11/18/2016  . Osteoporosis 08/28/2016  . Paronychia of great toe, left 08/10/2016  . Carotid arterial disease ([place]) 07/09/2016  . Hypocalcemia 07/09/2016  . Diplopia 07/02/2016  . S/P lumbar laminectomy 03/11/2016  . Radicular pain of left lower extremity 02/21/2016  . Cough 01/10/2016  . 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 Myasthenia gravis ([place])   . Diabetes mellitus type 2 in nonobese (H[facility])   .  Ataxia, post-stroke   . Gait disturbance, post-stroke   . Proximal leg weakness   . Cervical spondylosis without myelopathy   . Achilles tendinitis of right lower extremity   . Chronic diastolic CHF (congestive heart failure) ([place])   . Gastroesophageal reflux disease without esophagitis   . 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 Cerebellar stroke ([place])   . Ischemic stroke ([place])   . Cerebrovascular accident (CVA) due to thrombosis of left vertebral artery ([place])   . History of pulmonary embolus ([place])   . Chronic anticoagulation   . HLD (hyperlipidemia)   . Neck pain 03/19/2015  . Pain in the chest   . 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[place])   . Diabetic gastroparesis ([place]) 03/07/2015  . Screening for osteoporosis 07/19/2014  . Dyspnea 04/09/2014  . Edema  04/09/2014  . Routine general medical examination at a health care facility 10/23/2013  . Bilateral carotid bruits 05/24/2013  . Abnormal CT scan, lung 05/24/2013  . Encounter for therapeutic drug monitoring 03/14/2013  . Lymphadenopathy, inguinal 01/11/2013  . AKI (acute kidney injury) ([place]) 10/28/2012  . 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 Myasthenia gravis with exacerbation ([place]) 03/31/2012  . Disease of salivary gland 12/09/2011  . Left facial pain 10/28/2011  . Pulmonary embolism ([place]) 10/25/2011  . 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 GERD with stricture 09/23/2011  . Long term (current) use of anticoagulants 08/03/2011  . History of pulmonary embolism 07/30/2011  . History of DVT (deep vein thrombosis) 07/30/2011  . Subconjunctival hemorrhage 06/22/2011  . Bilateral conjunctivitis 04/09/2011  . Diabetes mellitus type 2, controlled ([place]) 12/18/2010  . Hearing loss 08/04/2010  . Ptosis of eyelid 12/30/2009  . Essential hypertension 04/24/2009  . HYPERLIPIDEMIA, MIXED 10/05/2008  . HEMORRHOIDS-INTERNAL 07/02/2008  . HEMORRHOIDS-EXTERNAL 07/02/2008   . DIVERTICULOSIS-COLON 07/02/2008  . PERSONAL HX COLONIC POLYPS 07/02/2008  . RASH-NONVESICULAR 02/07/2008  . PNEUMONIA 12/08/2007  . Chronic myeloid leukemia in remission ([place]) 07/07/2007  . CAD (coronary artery disease) 07/07/2007  . NEPHROLITHIASIS, HX OF 07/07/2007    Jamey Reas PT, DPT 07/20/2018, 8:24 AM  [facility] [address] [place], [name], [address] Phone: [phone]   Fax:  [phone]  Name: GAD AYMOND MRN: [id number] Date of Birth: Oct 01, 1940

## 2018-07-21 ENCOUNTER — Telehealth: Payer: Self-pay | Admitting: Family Medicine

## 2018-07-21 ENCOUNTER — Ambulatory Visit: Payer: Medicare Other | Admitting: Physical Therapy

## 2018-07-21 ENCOUNTER — Other Ambulatory Visit: Payer: Self-pay

## 2018-07-21 ENCOUNTER — Encounter: Payer: Self-pay | Admitting: Physical Therapy

## 2018-07-21 DIAGNOSIS — M6281 Muscle weakness (generalized): Secondary | ICD-10-CM

## 2018-07-21 DIAGNOSIS — R2689 Other abnormalities of gait and mobility: Secondary | ICD-10-CM | POA: Diagnosis not present

## 2018-07-21 DIAGNOSIS — R293 Abnormal posture: Secondary | ICD-10-CM | POA: Diagnosis not present

## 2018-07-21 DIAGNOSIS — S51811A Laceration without foreign body of right forearm, initial encounter: Secondary | ICD-10-CM | POA: Diagnosis not present

## 2018-07-21 DIAGNOSIS — Z9181 History of falling: Secondary | ICD-10-CM | POA: Diagnosis not present

## 2018-07-21 DIAGNOSIS — R2681 Unsteadiness on feet: Secondary | ICD-10-CM | POA: Diagnosis not present

## 2018-07-21 NOTE — Therapy (Signed)
[facility] [address] [place], [name], [address] Phone: [phone]   Fax:  [phone]  Physical Therapy Treatment  Patient Details  Name: [name] MRN: [id number] Date of Birth: Apr 05, 1940 Referring Provider (PT): Kathrynn Ducking, MD   Encounter Date: 07/21/2018  PT End of Session - 07/21/18 1103    Visit Number  9    Number of Visits  20    Date for PT Re-Evaluation  08/26/18    Authorization Type  Medicare & [employer]    PT Start Time  1057    PT Stop Time  1138    PT Time Calculation (min)  41 min    Equipment Utilized During Treatment  Gait belt    Activity Tolerance  Patient tolerated treatment well    Behavior During Therapy  [facility] for tasks assessed/performed       Past Medical History:  Diagnosis Date  . Bruises easily    d/t being on Eliquis  . Carotid artery disease ([place])    a. Duplex 05/2014: 66-44% RICA, 0-34% LICA, elevated velocities in right subclavian artery, normal left subclavian artery..  . Chronic back pain    HNP  . Chronic kidney disease (CKD)   . Claustrophobia    takes Ativan as needed  . 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 Coronary atherosclerosis of unspecified type of vessel, native or graft    a. cath in 2003 showed 10-20% LM, scattered 20% prox-mid LAD, 30% more distal LAD, 50-60% stenosis of LAD towards apex, 20% Cx, 30% dRCA, focal 95% stenosis in smaller of 2 branches of PDA, EF 60-65%.  . Diabetes mellitus ([place])   . Diarrhea    takes Imodium daily as needed  . Diverticulosis   . Diverticulosis of colon (without mention of hemorrhage)   . DJD (degenerative joint disease)   . DVT (deep venous thrombosis) ([place]) 07/2011  . Esophageal stricture   . Essential hypertension    takes Imdur and Lisinopril daily  . Gastric polyps    benign  . GERD (gastroesophageal reflux disease)    takes Nexium daily  . Glaucoma    uses eye drops  . History  of bronchitis    not sure when the last time  . History of colon polyps    benign  . History of kidney stones   . History of kidney stones   . Insomnia    takes Melatonin nightly as needed  . Mixed hyperlipidemia    takes Crestor daily  . Myasthenia gravis ([place])    uses prednisone  . Osteoporosis 2016  . Peripheral edema    takes Lasix daily  . Pituitary tumor    checks [facility]Walden at [facility] checks it every Jan   . Pneumonia 01/2016  . PONV (postoperative nausea and vomiting)   . Ptosis   . Pulmonary embolus ([place]) 2012  . Stroke [facility]) 06/2015    Past Surgical History:  Procedure Laterality Date  . ABDOMINAL AORTOGRAM W/LOWER EXTREMITY N/A 11/18/2016   Procedure: ABDOMINAL AORTOGRAM W/LOWER EXTREMITY;  Surgeon: [name], MD;  Location: [facility];  Service: Cardiovascular;  Laterality: N/A;  . AMPUTATION Right 02/07/2018   Procedure: right leg AMPUTATION ABOVE KNEE;  Surgeon: [name], MD;  Location: [facility];  Service: Vascular;  Laterality: Right;  . AORTOGRAM Right 12/23/2017   Procedure: AORTOGRAM, RIGHT LOWER EXTREMITY ANGIOGRAM, RIGHT SUPERFICIAL FEMORAL ARTERY ANGIOPLASTY WITH STENT,  RIGHT POPLITEAL ARTERY ANGIOPLASTY WITH STENT, LEFT GROIN CUTDOWN AND LEFT FEMORAL ARTERY REPAIR;  Surgeon: [name], MD;  Location: [facility];  Service: Vascular;  Laterality: Right;  . BACK SURGERY     x2, lumbar and cervical  . CARDIAC CATHETERIZATION    . CATARACT EXTRACTION Bilateral 12/12  . COLONOSCOPY    . ESOPHAGOGASTRODUODENOSCOPY ENDOSCOPY  04/2015  . IR GENERIC HISTORICAL  03/03/2016   IR IVC FILTER PLMT / S&I [name] GUID/MOD SED 03/03/2016 [name], MD [facility]INTER[facility] RAD  . IR GENERIC HISTORICAL  04/09/2016   IR RADIOLOGIST EVAL & MGMT 04/09/2016 [name], MD [facility] INTER[facility] RAD  . IR GENERIC HISTORICAL  04/30/2016   IR IVC FILTER RETRIEVAL / S&I [name] GUID/MOD SED 04/30/2016 [name], MD [facility]-INTERV RAD  . LITHOTRIPSY    . LUMBAR  LAMINECTOMY/DECOMPRESSION MICRODISCECTOMY Right 09/14/2012   Procedure: Right Lumbar three-four, four-five, Lumbar five-Sacral one decompressive laminectomy;  Surgeon: [name], MD;  Location: [facility];  Service: Neurosurgery;  Laterality: Right;  . LUMBAR LAMINECTOMY/DECOMPRESSION MICRODISCECTOMY Left 03/11/2016   Procedure: Left Lumbar Two-ThreeMicrodiscectomy;  Surgeon: [name], MD;  Location: [facility];  Service: Neurosurgery;  Laterality: Left;  . PERIPHERAL VASCULAR INTERVENTION  11/18/2016   Procedure: PERIPHERAL VASCULAR INTERVENTION;  Surgeon: [name], MD;  Location: [facility];  Service: Cardiovascular;;  Left popliteal  . SHOULDER SURGERY Left 05/07/2009  . TONSILLECTOMY      There were no vitals filed for this visit.  Subjective Assessment - 07/21/18 1101    Subjective  No new complaints. No falls. Reports right groin/thigh pain is getting better. Slid out of transport chair. Landed on back, then buttocks. Hurt lower back and scraped skin back on right arm. Still having bleeding on right arm. Neighbor came over and assisted up into chair.    Patient Stated Goals  He wants to walk with prosthesis, get around house & back in community. Try golf again.     Currently in Pain?  Yes    Pain Score  3     Pain Location  Generalized   low back and right thigh/groin   Pain Descriptors / Indicators  Aching;Sore    Pain Type  Acute pain;Chronic pain    Pain Onset  More than a month ago    Pain Frequency  Intermittent    Aggravating Factors   lifting leg in sitting    Pain Relieving Factors  ice massage,resting               [facility] Adult PT Treatment/Exercise - 07/21/18 1104      Transfers   Transfers  Sit to Stand;Stand to Sit;Squat Pivot Transfers    Sit to Stand  5: Supervision;4: Min assist;With upper extremity assist;With armrests;From chair/3-in-1    Sit to Stand Details  Verbal cues for technique;Visual cues/gestures for sequencing    Sit to  Stand Details (indicate cue type and reason)  needs UE support for stability with standing    Stand to Sit  5: Supervision;4: Min guard;With upper extremity assist;With armrests;To chair/3-in-1    Stand to Sit Details (indicate cue type and reason)  Verbal cues for technique;Visual cues/gestures for sequencing;Visual cues for safe use of DME/AE    Stand to Sit Details  needs UE support for stability       Ambulation/Gait   Ambulation/Gait  Yes    Ambulation/Gait Assistance  4: Min guard;4: Min assist    Ambulation/Gait Assistance Details  multimodal  cues needed on sequencing, weight shifting and walker position with gait. worked on progressing from step to pattern to a reciprocal pattern with gait.     Ambulation Distance (Feet)  85 Feet    Assistive device  Rolling walker;Prosthesis    Gait Pattern  Step-to pattern;Decreased step length - left;Decreased stance time - right;Decreased stride length;Decreased hip/knee flexion - right;Decreased weight shift to right;Right hip hike;Right circumduction;Antalgic;Lateral hip instability;Trunk flexed;Narrow base of support;Poor foot clearance - right    Ambulation Surface  Level;Indoor    Stairs  Yes    Stairs Assistance  4: Min assist    Stairs Assistance Details (indicate cue type and reason)  reminder cues on weight shifting and hand advancement on rails to assist with this.     Stair Management Technique  Two rails;Step to pattern;Forwards    Ramp  3: Mod assist    Ramp Details (indicate cue type and reason)  with RW/prosthesis- cues for sequencing, posture and walker use/position on ramp       Prosthetics   Current prosthetic wear tolerance (days/week)   daily    Current prosthetic wear tolerance (#hours/day)   liner 10hrs total /day with prosthesis 3 hrs 2x/day    Residual limb condition   intact with no open areas,     Education Provided  Residual limb care;Proper wear schedule/adjustment;Proper Donning    Person(s) Educated  Patient;Spouse     Education Method  Explanation;Demonstration;Verbal cues    Education Method  Verbalized understanding;Returned demonstration;Verbal cues required;Needs further Land Prosthesis  Supervision               PT Short Term Goals - 06/30/18 1519      PT SHORT TERM GOAL #1   Title  Patient demonstrates proper donning including tightening suspension strap safely without assistance or cues. (All STGs Target Date: 07/29/2018)    Time  1    Period  Months    Status  Revised    Target Date  07/29/18      PT SHORT TERM GOAL #2   Title  Patient tolerates wear of prosthesis >8hrs total /day without skin or limb pain issues.     Time  1    Period  Months    Status  On-going    Target Date  07/29/18      PT SHORT TERM GOAL #3   Title  Standing balance with RW reaches 7" and scans environment with head & trunk movements safely with no loss of balance.     Time  1    Period  Months    Status  Revised    Target Date  07/29/18      PT SHORT TERM GOAL #4   Title  Patient ambulates 150' with RW & prosthesis with supervision.     Time  1    Period  Months    Status  Revised    Target Date  07/29/18      PT SHORT TERM GOAL #5   Title  Patient negotiates stairs with 2 rails, ramps & curbs with RW & prosthesis with minA.     Time  1    Period  Months    Status  New    Target Date  07/29/18        PT Long Term Goals - 06/16/18 2101      PT LONG TERM GOAL #1   Title  Patient & wife verbalize & demonstrate understanding  of proper prosthetic care to enable safe use of prosthesis. (All LTGs Target Date: 08/26/2018)    Time  12    Period  Weeks    Status  On-going      PT LONG TERM GOAL #2   Title  Patient tolerates wearing prosthesis >90% of awake hours without skin or limb pain issues to enable function throughout his day.     Time  12    Period  Weeks    Status  On-going      PT LONG TERM GOAL #3   Title  Patient standing balance with RW support modified  independent: reaching 10" anteriorly, pick up item from floor & scans environment.     Time  12    Period  Weeks    Status  On-going      PT LONG TERM GOAL #4   Title  Patient ambulates 300' with RW & prosthesis modified independent to enable community mobility.     Time  12    Period  Weeks    Status  On-going      PT LONG TERM GOAL #5   Title  Patient negotiates ramps & curbs with RW & prosthesis modified independent to enable community access.     Time  12    Period  Weeks    Status  On-going            Plan - 07/21/18 1104    Clinical Impression Statement  Today's skilled session continued to focus on gait with RW/prosthesis and stairs with rails/prosthesis. Emphasis place on progression to reciprocal gait pattern vs step to gait pattern today. The pt is progressing toward goals and should benefit from continued PT to progress toward unmet goals.    Personal Factors and Comorbidities  Age;Comorbidity 3+;Fitness;Past/Current Experience    Comorbidities  Back surgery x 4; Tonsillectomy; Cataract extraction (Bilateral, 12/12); Shoulder surgery (Left, 05/07/2009); Cardiac catheterization; Lumbar laminectomy/decompression microdiscectomy (Right, 09/14/2012); Esophagogastroduodenoscopy endoscopy (04/2015); ir generic historical (03/03/2016); Colonoscopy; Lithotripsy; Lumbar laminectomy/decompression microdiscectomy (Left, 03/11/2016); ir generic historical (04/09/2016); ir generic historical (04/30/2016); ABDOMINAL AORTOGRAM W/LOWER EXTREMITY (N/A, 11/18/2016); PERIPHERAL VASCULAR INTERVENTION (11/18/2016); Aortogram (Right, 12/23/2017); and Amputation Transfemoral (Right, 02/07/2018).    Examination-Activity Limitations  Hygiene/Grooming;Lift;Locomotion Level;Reach Overhead;Stairs;Stand;Toileting;Transfers    Examination-Participation Restrictions  Church;Community Activity;Driving    Stability/Clinical Decision Making  Evolving/Moderate complexity    Rehab Potential  Good    PT Frequency  2x  / week    PT Duration  12 weeks    PT Treatment/Interventions  ADLs/Self Care Home Management;Stair training;Gait training;DME Instruction;Functional mobility training;Therapeutic activities;Therapeutic exercise;Balance training;Neuromuscular re-education;Patient/family education;Prosthetic Training;Manual techniques;Vestibular    PT Next Visit Plan  10 visit progress note due next visit; prosthetic gait including stairs, ramps & curbs and outdoors on paved surfaces    Consulted and Agree with Plan of Care  Patient;Family member/caregiver    Family Member Consulted  wife, Jonathan Corpus       Patient will benefit from skilled therapeutic intervention in order to improve the following deficits and impairments:  Abnormal gait, Decreased activity tolerance, Decreased balance, Decreased endurance, Decreased knowledge of use of DME, Decreased mobility, Decreased range of motion, Decreased strength, Impaired flexibility, Prosthetic Dependency, Postural dysfunction  Visit Diagnosis: Muscle weakness (generalized) -   Abnormal posture   Unsteadiness on feet   Other abnormalities of gait and mobility      Problem List Patient Active Problem List   Diagnosis Date Noted  . Neuropathic pain of right ankle 06/27/2018  .  Insomnia due to medical condition 04/13/2018  . Atherosclerosis of native coronary artery of native heart without angina pectoris   . Hypokalemia   . Stage 3 chronic kidney disease ([place])   . 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 Acute blood loss anemia 02/08/2018  . Post-operative pain   . Unilateral AKA, right ([place])   . Ischemic foot 02/05/2018  . Ischemia of right lower extremity   . Pressure injury of skin 12/27/2017  . Arterial occlusive disease   . Postoperative pain   . CKD (chronic kidney disease), stage III ([place])   . History of CVA (cerebrovascular accident)   . Femoral artery thrombosis, right ([place]) 12/23/2017  . Septic arthritis of left sternoclavicular joint ([place])  11/03/2017  . Cervical spinal cord compression ([place]) 10/30/2017  . Nasal abscess 09/25/2017  . Immunocompromised ([place]) 09/25/2017  . Acute on chronic blood loss anemia 08/05/2017  . Transient ischemic attack (TIA) 07/23/2017  . Subcortical infarction ([place])   . Right hemiparesis ([name])   . Ataxia 07/21/2017  . TIA (transient ischemic attack)   . Wheezing 06/17/2017  . Bursitis of left elbow 05/01/2017  . Dystonia 12/29/2016  . PAD (peripheral artery disease) ([place]) 11/18/2016  . Osteoporosis 08/28/2016  . Paronychia of great toe, left 08/10/2016  . Carotid arterial disease ([place]) 07/09/2016  . Hypocalcemia 07/09/2016  . Diplopia 07/02/2016  . S/P lumbar laminectomy 03/11/2016  . Radicular pain of left lower extremity 02/21/2016  . Cough 01/10/2016  . 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 Myasthenia gravis ([place])   . Diabetes mellitus type 2 in nonobese (HCC)   . Ataxia, post-stroke   . Gait disturbance, post-stroke   . Proximal leg weakness   . Cervical spondylosis without myelopathy   . Achilles tendinitis of right lower extremity   . Chronic diastolic CHF (congestive heart failure) ([place])   . Gastroesophageal reflux disease without esophagitis   . 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 Cerebellar stroke ([place])   . Ischemic stroke ([place])   . Cerebrovascular accident (CVA) due to thrombosis of left vertebral artery ([place])   . History of pulmonary embolus ([place])   . Chronic anticoagulation   . HLD (hyperlipidemia)   . Neck pain 03/19/2015  . Pain in the chest   . 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[place])   . Diabetic gastroparesis ([place]) 03/07/2015  . Screening for osteoporosis 07/19/2014  . Dyspnea 04/09/2014  . Edema 04/09/2014  . Routine general medical examination at a health care facility 10/23/2013  . Bilateral carotid bruits 05/24/2013  . Abnormal CT scan, lung 05/24/2013  . Encounter for  therapeutic drug monitoring 03/14/2013  . Lymphadenopathy, inguinal 01/11/2013  . AKI (acute kidney injury) ([place]) 10/28/2012  . 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 Myasthenia gravis with exacerbation ([place]) 03/31/2012  . Disease of salivary gland 12/09/2011  . Left facial pain 10/28/2011  . Pulmonary embolism ([place]) 10/25/2011  . 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 GERD with stricture 09/23/2011  . Long term (current) use of anticoagulants 08/03/2011  . History of pulmonary embolism 07/30/2011  . History of DVT (deep vein thrombosis) 07/30/2011  . Subconjunctival hemorrhage 06/22/2011  . Bilateral conjunctivitis 04/09/2011  . Diabetes mellitus type 2, controlled ([place]) 12/18/2010  . Hearing loss 08/04/2010  . Ptosis of eyelid 12/30/2009  . Essential hypertension 04/24/2009  . HYPERLIPIDEMIA, MIXED 10/05/2008  . HEMORRHOIDS-INTERNAL 07/02/2008  . HEMORRHOIDS-EXTERNAL 07/02/2008  . DIVERTICULOSIS-COLON 07/02/2008  .  PERSONAL HX COLONIC POLYPS 07/02/2008  . RASH-NONVESICULAR 02/07/2008  . PNEUMONIA 12/08/2007  . Chronic myeloid leukemia in remission ([place]) 07/07/2007  . CAD (coronary artery disease) 07/07/2007  . NEPHROLITHIASIS, HX OF 07/07/2007    Willow Ora, PTA, [facility] [address], [place], [place] [address] [phone] 07/21/18, 5:07 PM   Name: [name] MRN: [id number] Date of Birth: March 19, 1940

## 2018-07-21 NOTE — Telephone Encounter (Signed)
Patient was transferred to office to schedule appointment. Patient informed me that he did not need a appointment, he was calling to tell me that he was on [address] and was on his way here to get the nurse to look at something and tell him what to do. I informed patient that at this time we are not able to do walk-in and he would need an appointment. Patient was very adamant that he did not need an appointment and just needed the nurse to look at it and give advice. Patient refused to schedule appointment and informed that Dr. Ethelene Hal nurse would not be able to come outside and see him. Patient was informed that due to COVID-19 we are not able to accomadiate his request and we would be more than happy to schedule an appointment.

## 2018-07-26 ENCOUNTER — Ambulatory Visit: Payer: Medicare Other | Admitting: Physical Therapy

## 2018-07-26 ENCOUNTER — Encounter: Payer: Self-pay | Admitting: Physical Therapy

## 2018-07-26 ENCOUNTER — Other Ambulatory Visit: Payer: Self-pay

## 2018-07-26 DIAGNOSIS — Z9181 History of falling: Secondary | ICD-10-CM | POA: Diagnosis not present

## 2018-07-26 DIAGNOSIS — R2689 Other abnormalities of gait and mobility: Secondary | ICD-10-CM

## 2018-07-26 DIAGNOSIS — M6281 Muscle weakness (generalized): Secondary | ICD-10-CM

## 2018-07-26 DIAGNOSIS — R2681 Unsteadiness on feet: Secondary | ICD-10-CM

## 2018-07-26 DIAGNOSIS — R293 Abnormal posture: Secondary | ICD-10-CM

## 2018-07-26 NOTE — Therapy (Addendum)
[facility] [address] [place], [name], [address] Phone: [phone]   Fax:  [phone]  Physical Therapy Treatment & 10th Visit Progress Note  Patient Details  Name: [name] MRN: [id number] Date of Birth: Jan 31, 1941 Referring Provider (PT): Kathrynn Ducking, MD   Encounter Date: 07/26/2018   Progress Note Reporting Period 05/30/2018 to 07/26/2018  See note below for Objective Data and Assessment of Progress/Goals.       PT End of Session - 07/26/18 1106    Visit Number  10    Number of Visits  20    Date for PT Re-Evaluation  08/26/18    Authorization Type  Medicare & [employer]    PT Start Time  1100    PT Stop Time  1145    PT Time Calculation (min)  45 min    Equipment Utilized During Treatment  Gait belt    Activity Tolerance  Patient tolerated treatment well    Behavior During Therapy  WFL for tasks assessed/performed       Past Medical History:  Diagnosis Date  . Bruises easily    d/t being on Eliquis  . Carotid artery disease ([place])    a. Duplex 05/2014: 89-38% RICA, 1-01% LICA, elevated velocities in right subclavian artery, normal left subclavian artery..  . Chronic back pain    HNP  . Chronic kidney disease (CKD)   . Claustrophobia    takes Ativan as needed  . 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 Coronary atherosclerosis of unspecified type of vessel, native or graft    a. cath in 2003 showed 10-20% LM, scattered 20% prox-mid LAD, 30% more distal LAD, 50-60% stenosis of LAD towards apex, 20% Cx, 30% dRCA, focal 95% stenosis in smaller of 2 branches of PDA, EF 60-65%.  . Diabetes mellitus ([place])   . Diarrhea    takes Imodium daily as needed  . Diverticulosis   . Diverticulosis of colon (without mention of hemorrhage)   . DJD (degenerative joint disease)   . DVT (deep venous thrombosis) ([place]) 07/2011  . Esophageal stricture   . Essential hypertension    takes  Imdur and Lisinopril daily  . Gastric polyps    benign  . GERD (gastroesophageal reflux disease)    takes Nexium daily  . Glaucoma    uses eye drops  . History of bronchitis    not sure when the last time  . History of colon polyps    benign  . History of kidney stones   . History of kidney stones   . Insomnia    takes Melatonin nightly as needed  . Mixed hyperlipidemia    takes Crestor daily  . Myasthenia gravis ([place])    uses prednisone  . Osteoporosis 2016  . Peripheral edema    takes Lasix daily  . Pituitary tumor    checks Dr[facility]Walden at [facility] checks it every Jan   . Pneumonia 01/2016  . PONV (postoperative nausea and vomiting)   . Ptosis   . Pulmonary embolus ([place]) 2012  . Stroke [facility]) 06/2015    Past Surgical History:  Procedure Laterality Date  . ABDOMINAL AORTOGRAM W/LOWER EXTREMITY N/A 11/18/2016   Procedure: ABDOMINAL AORTOGRAM W/LOWER EXTREMITY;  Surgeon: [name], MD;  Location: [facility];  Service: Cardiovascular;  Laterality: N/A;  . AMPUTATION Right 02/07/2018   Procedure: right leg AMPUTATION ABOVE KNEE;  Surgeon: [name], MD;  Location: [facility];  Service: Vascular;  Laterality: Right;  . AORTOGRAM Right 12/23/2017   Procedure: AORTOGRAM, RIGHT LOWER EXTREMITY ANGIOGRAM, RIGHT SUPERFICIAL FEMORAL ARTERY ANGIOPLASTY WITH STENT, RIGHT POPLITEAL ARTERY ANGIOPLASTY WITH STENT, LEFT GROIN CUTDOWN AND LEFT FEMORAL ARTERY REPAIR;  Surgeon: [name], MD;  Location: [facility];  Service: Vascular;  Laterality: Right;  . BACK SURGERY     x2, lumbar and cervical  . CARDIAC CATHETERIZATION    . CATARACT EXTRACTION Bilateral 12/12  . COLONOSCOPY    . ESOPHAGOGASTRODUODENOSCOPY ENDOSCOPY  04/2015  . IR GENERIC HISTORICAL  03/03/2016   IR IVC FILTER PLMT / S&I [name] GUID/MOD SED 03/03/2016 [name], MD [facility]  . IR GENERIC HISTORICAL  04/09/2016   IR RADIOLOGIST EVAL & MGMT 04/09/2016 [name], MD [facility]  . IR  GENERIC HISTORICAL  04/30/2016   IR IVC FILTER RETRIEVAL / S&I [name] GUID/MOD SED 04/30/2016 [name], MD [facility]  . LITHOTRIPSY    . LUMBAR LAMINECTOMY/DECOMPRESSION MICRODISCECTOMY Right 09/14/2012   Procedure: Right Lumbar three-four, four-five, Lumbar five-Sacral one decompressive laminectomy;  Surgeon: [name], MD;  Location: [facility];  Service: Neurosurgery;  Laterality: Right;  . LUMBAR LAMINECTOMY/DECOMPRESSION MICRODISCECTOMY Left 03/11/2016   Procedure: Left Lumbar Two-ThreeMicrodiscectomy;  Surgeon: [name], MD;  Location: [facility];  Service: Neurosurgery;  Laterality: Left;  . PERIPHERAL VASCULAR INTERVENTION  11/18/2016   Procedure: PERIPHERAL VASCULAR INTERVENTION;  Surgeon: [name], MD;  Location: [facility];  Service: Cardiovascular;;  Left popliteal  . SHOULDER SURGERY Left 05/07/2009  . TONSILLECTOMY      There were no vitals filed for this visit.  Subjective Assessment - 07/26/18 1102    Subjective  Saw MD about wound on right arm from fall. He cuaterized it to stop the bleeding and applied steri strips. No issues since then with dressing changes. No other falls to report. Still sore on left inner thigh/groin area, continues to use ice massage. Does report no change in chronic back pain.    Patient is accompained by:  Family member   spouse   Pertinent History   Back surgery X 4; Tonsillectomy; Cataract extraction (Bilateral, 12/12); Shoulder surgery (Left, 05/07/2009); Cardiac catheterization; Lumbar laminectomy/decompression microdiscectomy (Right, 09/14/2012); Esophagogastroduodenoscopy endoscopy (04/2015); ir generic historical (03/03/2016); Colonoscopy; Lithotripsy; Lumbar laminectomy/decompression microdiscectomy (Left, 03/11/2016); ir generic historical (04/09/2016); ir generic historical (04/30/2016); ABDOMINAL AORTOGRAM W/LOWER EXTREMITY (N/A, 11/18/2016); PERIPHERAL VASCULAR INTERVENTION (11/18/2016); Aortogram (Right, 12/23/2017); and  Transfemoral Amputation (Right, 02/07/2018).    Pain Score  3     Pain Location  Other (Comment)   groin and inner thigh   Pain Orientation  Left    Pain Descriptors / Indicators  Aching;Sore    Pain Type  Acute pain    Pain Onset  1 to 4 weeks ago    Pain Frequency  Intermittent    Aggravating Factors   lifting leg in sitting    Pain Relieving Factors  ice massage, resting    Multiple Pain Sites  Yes    Pain Score  3    Pain Location  Back    Pain Orientation  Lower    Pain Descriptors / Indicators  Aching;Sore    Pain Type  Chronic pain    Pain Onset  More than a month ago    Pain Frequency  Constant    Aggravating Factors   increased activity, poor posture    Pain Relieving Factors  rest, medicated pad  [facility] Adult PT Treatment/Exercise - 07/26/18 1107      Transfers   Transfers  Sit to Stand;Stand to Sit    Sit to Stand  4: Min guard;With upper extremity assist;From bed;From chair/3-in-1    Sit to Stand Details (indicate cue type and reason)  needs UE support for stability with standing    Stand to Sit  5: Supervision;With upper extremity assist;To bed;To chair/3-in-1    Stand to Sit Details  needs UE support for stability with sitting down       Ambulation/Gait   Ambulation/Gait  Yes    Ambulation/Gait Assistance  4: Min guard;4: Min assist    Ambulation/Gait Assistance Details  cues needed on posture, step length, weight shifting and to increase base of support with gait. occasional left foot scuffing with min assist needed for balance recovery.     Ambulation Distance (Feet)  115 Feet   x1, 84 x1, plus around gym with barriers   Assistive device  Rolling walker;Prosthesis    Gait Pattern  Step-to pattern;Decreased step length - left;Decreased stance time - right;Decreased stride length;Decreased hip/knee flexion - right;Decreased weight shift to right;Right hip hike;Right circumduction;Antalgic;Lateral hip instability;Trunk flexed;Narrow base of  support;Poor foot clearance - right    Ambulation Surface  Level;Indoor    Stairs  Yes    Stairs Assistance  4: Min assist    Stairs Assistance Details (indicate cue type and reason)  reminder cues to advance hands on rails and for weight shifting     Stair Management Technique  Two rails;Step to pattern;Forwards    Number of Stairs  4    Ramp  4: Min assist    Ramp Details (indicate cue type and reason)  with RW/prosthesis with cues on posture, walker position and sequencing    Curb  4: Min assist    Curb Details (indicate cue type and reason)  with RW/prosthesis with cues on stance position with walker advancement and reminder cues for sequencing.       Prosthetics   Current prosthetic wear tolerance (days/week)   daily    Current prosthetic wear tolerance (#hours/day)   liner 10hrs total /day with prosthesis 4 hrs 2x/day    Residual limb condition   intact with no open areas,     Education Provided  Residual limb care;Proper wear schedule/adjustment;Proper Donning    Person(s) Educated  Patient;Spouse    Education Method  Explanation;Demonstration;Verbal cues    Education Method  Verbalized understanding;Returned demonstration;Verbal cues required;Needs further Land Prosthesis  Supervision               PT Short Term Goals - 07/26/18 1134      PT SHORT TERM GOAL #1   Title  Patient demonstrates proper donning including tightening suspension strap safely without assistance or cues. (All STGs Target Date: 07/29/2018)    Baseline  07/26/18: pt continues to need cues to keep weight on prosthesis and not "bounce" on the prosthesis to sink in prior to tightening the strap.    Time  --    Period  --    Status  Partially Met    Target Date  --      PT SHORT TERM GOAL #2   Title  Patient tolerates wear of prosthesis >8hrs total /day without skin or limb pain issues.     Baseline  07/26/18: pt able to wear liner for this amount of time, only wearing prosthesis for  up to 4 hours a  day right now    Status  Partially Met      PT SHORT TERM GOAL #3   Title  Standing balance with RW reaches 7" and scans environment with head & trunk movements safely with no loss of balance.     Baseline  07/26/18: met today with RW support and min guard assist for safety. trunk rotation limited by back/hip pain    Status  Achieved    Target Date  07/29/18      PT SHORT TERM GOAL #4   Title  Patient ambulates 150' with RW & prosthesis with supervision.     Baseline  07/26/18: 115 feet today before needing to rest due to hip/back pain, min guard to min assist    Status  Partially Met    Target Date  07/29/18      PT SHORT TERM GOAL #5   Title  Patient negotiates stairs with 2 rails, ramps & curbs with RW & prosthesis with minA.     Baseline  07/26/18: met with RW/prosthesis/rails today    Time  --    Period  --    Status  Achieved    Target Date  07/29/18        PT Long Term Goals - 06/16/18 2101      PT LONG TERM GOAL #1   Title  Patient & wife verbalize & demonstrate understanding of proper prosthetic care to enable safe use of prosthesis. (All LTGs Target Date: 08/26/2018)    Time  12    Period  Weeks    Status  On-going      PT LONG TERM GOAL #2   Title  Patient tolerates wearing prosthesis >90% of awake hours without skin or limb pain issues to enable function throughout his day.     Time  12    Period  Weeks    Status  On-going      PT LONG TERM GOAL #3   Title  Patient standing balance with RW support modified independent: reaching 10" anteriorly, pick up item from floor & scans environment.     Time  12    Period  Weeks    Status  On-going      PT LONG TERM GOAL #4   Title  Patient ambulates 300' with RW & prosthesis modified independent to enable community mobility.     Time  12    Period  Weeks    Status  On-going      PT LONG TERM GOAL #5   Title  Patient negotiates ramps & curbs with RW & prosthesis modified independent to enable community  access.     Time  12    Period  Weeks    Status  On-going            Plan - 07/26/18 1106    Clinical Impression Statement  Today's skilled session focused on progress toward STGs with goals either partially or fully met. Gait distance continues to be limited by back/hip pain. Pt needs less assistance on barriers at this time. The pt is progressing toward goals and should benefit from continued PT to progress toward unmet goals.    Personal Factors and Comorbidities  Age;Comorbidity 3+;Fitness;Past/Current Experience    Comorbidities  Back surgery x 4; Tonsillectomy; Cataract extraction (Bilateral, 12/12); Shoulder surgery (Left, 05/07/2009); Cardiac catheterization; Lumbar laminectomy/decompression microdiscectomy (Right, 09/14/2012); Esophagogastroduodenoscopy endoscopy (04/2015); ir generic historical (03/03/2016); Colonoscopy; Lithotripsy; Lumbar laminectomy/decompression microdiscectomy (Left, 03/11/2016); ir generic historical (04/09/2016);  ir generic historical (04/30/2016); ABDOMINAL AORTOGRAM W/LOWER EXTREMITY (N/A, 11/18/2016); PERIPHERAL VASCULAR INTERVENTION (11/18/2016); Aortogram (Right, 12/23/2017); and Amputation Transfemoral (Right, 02/07/2018).    Examination-Activity Limitations  Hygiene/Grooming;Lift;Locomotion Level;Reach Overhead;Stairs;Stand;Toileting;Transfers    Examination-Participation Restrictions  Church;Community Activity;Driving    Stability/Clinical Decision Making  Evolving/Moderate complexity    Rehab Potential  Good    PT Frequency  2x / week    PT Duration  12 weeks    PT Treatment/Interventions  ADLs/Self Care Home Management;Stair training;Gait training;DME Instruction;Functional mobility training;Therapeutic activities;Therapeutic exercise;Balance training;Neuromuscular re-education;Patient/family education;Prosthetic Training;Manual techniques;Vestibular    PT Next Visit Plan  prosthetic gait including stairs, ramps & curbs and outdoors on paved surfaces     Consulted and Agree with Plan of Care  Patient;Family member/caregiver    Family Member Consulted  wife, Troye Hiemstra       Patient will benefit from skilled therapeutic intervention in order to improve the following deficits and impairments:  Abnormal gait, Decreased activity tolerance, Decreased balance, Decreased endurance, Decreased knowledge of use of DME, Decreased mobility, Decreased range of motion, Decreased strength, Impaired flexibility, Prosthetic Dependency, Postural dysfunction  Visit Diagnosis: 1. Muscle weakness (generalized)   2. Unsteadiness on feet   3. Other abnormalities of gait and mobility   4. Abnormal posture        Problem List Patient Active Problem List   Diagnosis Date Noted  . Neuropathic pain of right ankle 06/27/2018  . Insomnia due to medical condition 04/13/2018  . Atherosclerosis of native coronary artery of native heart without angina pectoris   . Hypokalemia   . Stage 3 chronic kidney disease ([place])   . Unilateral AKA ([place]) 02/10/2018  . Acute blood loss anemia 02/08/2018  . Post-operative pain   . Unilateral AKA, right ([place])   . Ischemic foot 02/05/2018  . Ischemia of right lower extremity   . Pressure injury of skin 12/27/2017  . Arterial occlusive disease   . Postoperative pain   . CKD (chronic kidney disease), stage III ([place])   . History of CVA (cerebrovascular accident)   . 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 Septic arthritis of left sternoclavicular joint ([place]) 11/03/2017  . Cervical spinal cord compression ([place]) 10/30/2017  . Nasal abscess 09/25/2017  . Immunocompromised ([place]) 09/25/2017  . Acute on chronic blood loss anemia 08/05/2017  . Transient ischemic attack (TIA) 07/23/2017  . Subcortical infarction ([place])   . Right hemiparesis ([place])   . Ataxia 07/21/2017  . TIA (transient ischemic attack)   . Wheezing 06/17/2017  . Bursitis of left elbow 05/01/2017  . Dystonia 12/29/2016  . PAD (peripheral artery  disease) ([employer]) 11/18/2016  . Osteoporosis 08/28/2016  . Paronychia of great toe, left 08/10/2016  . Carotid arterial disease ([place]) 07/09/2016  . Hypocalcemia 07/09/2016  . Diplopia 07/02/2016  . S/P lumbar laminectomy 03/11/2016  . Radicular pain of left lower extremity 02/21/2016  . Cough 01/10/2016  . 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 Myasthenia gravis ([place])   . Diabetes mellitus type 2 in nonobese ([facility])   . Ataxia, post-stroke   . Gait disturbance, post-stroke   . Proximal leg weakness   . Cervical spondylosis without myelopathy   . Achilles tendinitis of right lower extremity   . Chronic diastolic CHF (congestive heart failure) ([place])   . Gastroesophageal reflux disease without esophagitis   . 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 Cerebellar stroke ([place])   . Ischemic stroke ([place])   . Cerebrovascular  accident (CVA) due to thrombosis of left vertebral artery ([place])   . History of pulmonary embolus ([place])   . Chronic anticoagulation   . HLD (hyperlipidemia)   . Neck pain 03/19/2015  . Pain in the chest   . 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[place])   . Diabetic gastroparesis ([place]) 03/07/2015  . Screening for osteoporosis 07/19/2014  . Dyspnea 04/09/2014  . Edema 04/09/2014  . Routine general medical examination at a health care facility 10/23/2013  . Bilateral carotid bruits 05/24/2013  . Abnormal CT scan, lung 05/24/2013  . Encounter for therapeutic drug monitoring 03/14/2013  . Lymphadenopathy, inguinal 01/11/2013  . AKI (acute kidney injury) ([place]) 10/28/2012  . 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 Myasthenia gravis with exacerbation ([place]) 03/31/2012  . Disease of salivary gland 12/09/2011  . Left facial pain 10/28/2011  . Pulmonary embolism ([place]) 10/25/2011  . 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 GERD with stricture 09/23/2011  . Long term (current) use of anticoagulants  08/03/2011  . History of pulmonary embolism 07/30/2011  . History of DVT (deep vein thrombosis) 07/30/2011  . Subconjunctival hemorrhage 06/22/2011  . Bilateral conjunctivitis 04/09/2011  . Diabetes mellitus type 2, controlled ([place]) 12/18/2010  . Hearing loss 08/04/2010  . Ptosis of eyelid 12/30/2009  . Essential hypertension 04/24/2009  . HYPERLIPIDEMIA, MIXED 10/05/2008  . HEMORRHOIDS-INTERNAL 07/02/2008  . HEMORRHOIDS-EXTERNAL 07/02/2008  . DIVERTICULOSIS-COLON 07/02/2008  . PERSONAL HX COLONIC POLYPS 07/02/2008  . RASH-NONVESICULAR 02/07/2008  . PNEUMONIA 12/08/2007  . Chronic myeloid leukemia in remission ([place]) 07/07/2007  . CAD (coronary artery disease) 07/07/2007  . NEPHROLITHIASIS, HX OF 07/07/2007    Willow Ora, PTA, [facility] [address] [place], [place] [address] [phone] 07/26/18, 3:28 PM   Name: [name] MRN: [id number] Date of Birth: 1940/09/15  PT Long Term Goals - 07/27/18 0855      PT LONG TERM GOAL #1   Title  Patient & wife verbalize & demonstrate understanding of proper prosthetic care to enable safe use of prosthesis. (All LTGs Target Date: 08/26/2018)    Time  12    Period  Weeks    Status  On-going    Target Date  08/26/18      PT LONG TERM GOAL #2   Title  Patient tolerates wearing prosthesis >80% of awake hours without skin or limb pain issues to enable function throughout his day.    Time  12    Period  Weeks    Status  Revised    Target Date  08/26/18      PT LONG TERM GOAL #3   Title  Patient standing balance with RW support modified independent: reaching 10" anteriorly, pick up item from floor & scans environment.     Time  12    Period  Weeks    Status  On-going    Target Date  08/26/18      PT LONG TERM GOAL #4   Title  Patient ambulates 150' with RW & prosthesis with supervision to enable community mobility with family.    Time  12    Period  Weeks    Status  On-going    Target Date   08/26/18      PT LONG TERM GOAL #5   Title  Patient negotiates ramps & curbs with RW & prosthesis with supervision to enable community access with family.    Time  12    Period  Weeks    Status  Revised    Target Date  08/26/18      PT LONG TERM GOAL #6   Title  Patient ambulates 18' indoors around furniture with RW & prosthesis modified independent to enable household mobility.    Time  1    Period  Months    Status  New    Target Date  08/26/18      Jamey Reas, PT, DPT PT Specializing in [place] 07/27/18 8:59 AM Phone:  [phone]  Fax:  [phone] [facility] [address] [place], [place] [address]

## 2018-07-28 ENCOUNTER — Ambulatory Visit: Payer: Medicare Other | Admitting: Physical Therapy

## 2018-07-28 ENCOUNTER — Encounter: Payer: Self-pay | Admitting: Physical Therapy

## 2018-07-28 ENCOUNTER — Other Ambulatory Visit: Payer: Self-pay

## 2018-07-28 DIAGNOSIS — R293 Abnormal posture: Secondary | ICD-10-CM

## 2018-07-28 DIAGNOSIS — M6281 Muscle weakness (generalized): Secondary | ICD-10-CM

## 2018-07-28 DIAGNOSIS — Z9181 History of falling: Secondary | ICD-10-CM | POA: Diagnosis not present

## 2018-07-28 DIAGNOSIS — R2681 Unsteadiness on feet: Secondary | ICD-10-CM

## 2018-07-28 DIAGNOSIS — R2689 Other abnormalities of gait and mobility: Secondary | ICD-10-CM | POA: Diagnosis not present

## 2018-07-28 NOTE — Therapy (Signed)
[facility] [address] [place], [name], [address] Phone: [phone]   Fax:  [phone]  Physical Therapy Treatment  Patient Details  Name: [name] MRN: [id number] Date of Birth: 07/29/1940 Referring Provider (PT): Kathrynn Ducking, MD   Encounter Date: 07/28/2018   CLINIC OPERATION CHANGES: [facility] is open at lower capacity following universal masking, social distancing, and patient screening.    PT End of Session - 07/28/18 1102    Visit Number  11    Number of Visits  20    Date for PT Re-Evaluation  08/26/18    Authorization Type  Medicare & [employer]    PT Start Time  1057    PT Stop Time  1145    PT Time Calculation (min)  48 min    Equipment Utilized During Treatment  Gait belt    Activity Tolerance  Patient tolerated treatment well    Behavior During Therapy  WFL for tasks assessed/performed       Past Medical History:  Diagnosis Date  . Bruises easily    d/t being on Eliquis  . Carotid artery disease ([place])    a. Duplex 05/2014: 56-25% RICA, 6-38% LICA, elevated velocities in right subclavian artery, normal left subclavian artery..  . Chronic back pain    HNP  . Chronic kidney disease (CKD)   . Claustrophobia    takes Ativan as needed  . 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 Coronary atherosclerosis of unspecified type of vessel, native or graft    a. cath in 2003 showed 10-20% LM, scattered 20% prox-mid LAD, 30% more distal LAD, 50-60% stenosis of LAD towards apex, 20% Cx, 30% dRCA, focal 95% stenosis in smaller of 2 branches of PDA, EF 60-65%.  . Diabetes mellitus ([place])   . Diarrhea    takes Imodium daily as needed  . Diverticulosis   . Diverticulosis of colon (without mention of hemorrhage)   . DJD (degenerative joint disease)   . DVT (deep venous thrombosis) ([place]) 07/2011  . Esophageal stricture   . Essential hypertension    takes Imdur and  Lisinopril daily  . Gastric polyps    benign  . GERD (gastroesophageal reflux disease)    takes Nexium daily  . Glaucoma    uses eye drops  . History of bronchitis    not sure when the last time  . History of colon polyps    benign  . History of kidney stones   . History of kidney stones   . Insomnia    takes Melatonin nightly as needed  . Mixed hyperlipidemia    takes Crestor daily  . Myasthenia gravis ([place])    uses prednisone  . Osteoporosis 2016  . Peripheral edema    takes Lasix daily  . Pituitary tumor    checks Dr[facility]Walden at [facility] checks it every Jan   . Pneumonia 01/2016  . PONV (postoperative nausea and vomiting)   . Ptosis   . Pulmonary embolus ([place]) 2012  . Stroke [facility]) 06/2015    Past Surgical History:  Procedure Laterality Date  . ABDOMINAL AORTOGRAM W/LOWER EXTREMITY N/A 11/18/2016   Procedure: ABDOMINAL AORTOGRAM W/LOWER EXTREMITY;  Surgeon: [name], MD;  Location: [facility];  Service: Cardiovascular;  Laterality: N/A;  . AMPUTATION Right 02/07/2018   Procedure: right leg AMPUTATION ABOVE KNEE;  Surgeon: [name], MD;  Location: [facility];  Service: Vascular;  Laterality: Right;  . AORTOGRAM Right 12/23/2017   Procedure: AORTOGRAM, RIGHT LOWER EXTREMITY ANGIOGRAM, RIGHT SUPERFICIAL FEMORAL ARTERY ANGIOPLASTY WITH STENT, RIGHT POPLITEAL ARTERY ANGIOPLASTY WITH STENT, LEFT GROIN CUTDOWN AND LEFT FEMORAL ARTERY REPAIR;  Surgeon: [name], MD;  Location: [facility];  Service: Vascular;  Laterality: Right;  . BACK SURGERY     x2, lumbar and cervical  . CARDIAC CATHETERIZATION    . CATARACT EXTRACTION Bilateral 12/12  . COLONOSCOPY    . ESOPHAGOGASTRODUODENOSCOPY ENDOSCOPY  04/2015  . IR GENERIC HISTORICAL  03/03/2016   IR IVC FILTER PLMT / S&I [name] GUID/MOD SED 03/03/2016 [name], MD [facility]  . IR GENERIC HISTORICAL  04/09/2016   IR RADIOLOGIST EVAL & MGMT 04/09/2016 [name], MD [facility] INTER[facility] RAD  . IR GENERIC  HISTORICAL  04/30/2016   IR IVC FILTER RETRIEVAL / S&I [name] GUID/MOD SED 04/30/2016 [name], MD [facility]-INTERV RAD  . LITHOTRIPSY    . LUMBAR LAMINECTOMY/DECOMPRESSION MICRODISCECTOMY Right 09/14/2012   Procedure: Right Lumbar three-four, four-five, Lumbar five-Sacral one decompressive laminectomy;  Surgeon: [name], MD;  Location: [facility];  Service: Neurosurgery;  Laterality: Right;  . LUMBAR LAMINECTOMY/DECOMPRESSION MICRODISCECTOMY Left 03/11/2016   Procedure: Left Lumbar Two-ThreeMicrodiscectomy;  Surgeon: [name], MD;  Location: [facility];  Service: Neurosurgery;  Laterality: Left;  . PERIPHERAL VASCULAR INTERVENTION  11/18/2016   Procedure: PERIPHERAL VASCULAR INTERVENTION;  Surgeon: [name], MD;  Location: [facility];  Service: Cardiovascular;;  Left popliteal  . SHOULDER SURGERY Left 05/07/2009  . TONSILLECTOMY      There were no vitals filed for this visit.  Subjective Assessment - 07/28/18 1059    Subjective  No new complaints. DId hear from Hematologist and he can stay off chemo for another month. No falls. Continues to report chronic low back pain.    Pertinent History   Back surgery X 4; Tonsillectomy; Cataract extraction (Bilateral, 12/12); Shoulder surgery (Left, 05/07/2009); Cardiac catheterization; Lumbar laminectomy/decompression microdiscectomy (Right, 09/14/2012); Esophagogastroduodenoscopy endoscopy (04/2015); ir generic historical (03/03/2016); Colonoscopy; Lithotripsy; Lumbar laminectomy/decompression microdiscectomy (Left, 03/11/2016); ir generic historical (04/09/2016); ir generic historical (04/30/2016); ABDOMINAL AORTOGRAM W/LOWER EXTREMITY (N/A, 11/18/2016); PERIPHERAL VASCULAR INTERVENTION (11/18/2016); Aortogram (Right, 12/23/2017); and Transfemoral Amputation (Right, 02/07/2018).    Limitations  Lifting;Standing;Walking;House hold activities    Patient Stated Goals  He wants to walk with prosthesis, get around house & back in community. Try golf  again.     Currently in Pain?  Yes    Pain Score  3     Pain Location  --   left thigh   Pain Orientation  Left    Pain Descriptors / Indicators  Aching;Sore    Pain Type  Acute pain    Pain Onset  1 to 4 weeks ago    Pain Frequency  Intermittent    Aggravating Factors   lifting leg in sitting    Pain Relieving Factors  ice massage, resting    Pain Score  3    Pain Location  Back    Pain Orientation  Lower;Left    Pain Descriptors / Indicators  Aching;Sore    Pain Type  Chronic pain    Pain Onset  More than a month ago    Pain Frequency  Constant    Aggravating Factors   increased activity, poor posture, OA    Pain Relieving Factors  rest, medicated pad           [facility] Adult PT Treatment/Exercise - 07/28/18 1102  Transfers   Transfers  Sit to Stand;Stand to Sit    Sit to Stand  4: Min guard;With upper extremity assist;From bed;From chair/3-in-1    Sit to Stand Details (indicate cue type and reason)  need UE support to stabilize with standing    Stand to Sit  5: Supervision;With upper extremity assist;To bed;To chair/3-in-1    Stand to Sit Details  needs UE support for stability with sitting down      Ambulation/Gait   Ambulation/Gait  Yes    Ambulation/Gait Assistance  4: Min guard;4: Min assist    Ambulation/Gait Assistance Details  cues needed for posture, step length, weight shifting and to increase base of support with gait.     Ambulation Distance (Feet)  115 Feet   x1, 40 x2, 50 x1   Assistive device  Rolling walker;Prosthesis    Gait Pattern  Step-to pattern;Decreased step length - left;Decreased stance time - right;Decreased stride length;Decreased hip/knee flexion - right;Decreased weight shift to right;Right hip hike;Right circumduction;Antalgic;Lateral hip instability;Trunk flexed;Narrow base of support;Poor foot clearance - right    Ambulation Surface  Level;Indoor      Therapeutic Activites    Therapeutic Activities  Other Therapeutic Activities     Other Therapeutic Activities  standing with RW support: alternating UE raises, then alternating upper trunk rotation left<>right with reaching back. min guard assist for balance.       Knee/Hip Exercises: Aerobic   Other Aerobic  Scifit with UE/LE's at level 2.0 for 8 minutes with goal >/=60 rpm for strengthening adn activity tolerance.       Prosthetics   Current prosthetic wear tolerance (days/week)   daily    Current prosthetic wear tolerance (#hours/day)   liner 10hrs total /day with prosthesis 4-5 hrs 2x/day    Residual limb condition   intact with no open areas,     Education Provided  Residual limb care;Proper wear schedule/adjustment;Proper Donning    Person(s) Educated  Patient;Spouse    Education Method  Explanation;Demonstration;Verbal cues    Education Method  Verbalized understanding;Verbal cues required;Needs further [place] Prosthesis  Supervision          PT Short Term Goals - 07/26/18 1134      PT SHORT TERM GOAL #1   Title  Patient demonstrates proper donning including tightening suspension strap safely without assistance or cues. (All STGs Target Date: 07/29/2018)    Baseline  07/26/18: pt continues to need cues to keep weight on prosthesis and not "bounce" on the prosthesis to sink in prior to tightening the strap.    Time  --    Period  --    Status  Partially Met    Target Date  --      PT SHORT TERM GOAL #2   Title  Patient tolerates wear of prosthesis >8hrs total /day without skin or limb pain issues.     Baseline  07/26/18: pt able to wear liner for this amount of time, only wearing prosthesis for up to 4 hours a day right now    Status  Partially Met      PT SHORT TERM GOAL #3   Title  Standing balance with RW reaches 7" and scans environment with head & trunk movements safely with no loss of balance.     Baseline  07/26/18: met today with RW support and min guard assist for safety. trunk rotation limited by back/hip pain    Status  Achieved     Target  Date  07/29/18      PT SHORT TERM GOAL #4   Title  Patient ambulates 150' with RW & prosthesis with supervision.     Baseline  07/26/18: 115 feet today before needing to rest due to hip/back pain, min guard to min assist    Status  Partially Met    Target Date  07/29/18      PT SHORT TERM GOAL #5   Title  Patient negotiates stairs with 2 rails, ramps & curbs with RW & prosthesis with minA.     Baseline  07/26/18: met with RW/prosthesis/rails today    Time  --    Period  --    Status  Achieved    Target Date  07/29/18        PT Long Term Goals - 07/27/18 0855      PT LONG TERM GOAL #1   Title  Patient & wife verbalize & demonstrate understanding of proper prosthetic care to enable safe use of prosthesis. (All LTGs Target Date: 08/26/2018)    Time  12    Period  Weeks    Status  On-going    Target Date  08/26/18      PT LONG TERM GOAL #2   Title  Patient tolerates wearing prosthesis >80% of awake hours without skin or limb pain issues to enable function throughout his day.    Time  12    Period  Weeks    Status  Revised    Target Date  08/26/18      PT LONG TERM GOAL #3   Title  Patient standing balance with RW support modified independent: reaching 10" anteriorly, pick up item from floor & scans environment.     Time  12    Period  Weeks    Status  On-going    Target Date  08/26/18      PT LONG TERM GOAL #4   Title  Patient ambulates 150' with RW & prosthesis with supervision to enable community mobility with family.    Time  12    Period  Weeks    Status  On-going    Target Date  08/26/18      PT LONG TERM GOAL #5   Title  Patient negotiates ramps & curbs with RW & prosthesis with supervision to enable community access with family.    Time  12    Period  Weeks    Status  Revised    Target Date  08/26/18      PT LONG TERM GOAL #6   Title  Patient ambulates 38' indoors around furniture with RW & prosthesis modified independent to enable household  mobility.    Time  1    Period  Months    Status  New    Target Date  08/26/18            Plan - 07/28/18 1102    Clinical Impression Statement  Today's skilled session continued to address activity tolerance and gait with RW/prosthesis. Pt with more fatigue today vs with previous session. Continues to have pain in left groin/thigh, plans to discuss this with his MD tomorrow at a already scheduled visit. The pt is progressing toward goals and should benefit from continued PT to progress toward unmet goals.    Personal Factors and Comorbidities  Age;Comorbidity 3+;Fitness;Past/Current Experience    Comorbidities  Back surgery x 4; Tonsillectomy; Cataract extraction (Bilateral, 12/12); Shoulder surgery (Left, 05/07/2009); Cardiac catheterization; Lumbar laminectomy/decompression  microdiscectomy (Right, 09/14/2012); Esophagogastroduodenoscopy endoscopy (04/2015); ir generic historical (03/03/2016); Colonoscopy; Lithotripsy; Lumbar laminectomy/decompression microdiscectomy (Left, 03/11/2016); ir generic historical (04/09/2016); ir generic historical (04/30/2016); ABDOMINAL AORTOGRAM W/LOWER EXTREMITY (N/A, 11/18/2016); PERIPHERAL VASCULAR INTERVENTION (11/18/2016); Aortogram (Right, 12/23/2017); and Amputation Transfemoral (Right, 02/07/2018).    Examination-Activity Limitations  Hygiene/Grooming;Lift;Locomotion Level;Reach Overhead;Stairs;Stand;Toileting;Transfers    Examination-Participation Restrictions  Church;Community Activity;Driving    Stability/Clinical Decision Making  Evolving/Moderate complexity    Rehab Potential  Good    PT Frequency  2x / week    PT Duration  12 weeks    PT Treatment/Interventions  ADLs/Self Care Home Management;Stair training;Gait training;DME Instruction;Functional mobility training;Therapeutic activities;Therapeutic exercise;Balance training;Neuromuscular re-education;Patient/family education;Prosthetic Training;Manual techniques;Vestibular    PT Next Visit Plan   prosthetic gait including stairs, ramps & curbs and outdoors on paved surfaces    Consulted and Agree with Plan of Care  Patient;Family member/caregiver    Family Member Consulted  wife, Fredrich Cory       Patient will benefit from skilled therapeutic intervention in order to improve the following deficits and impairments:  Abnormal gait, Decreased activity tolerance, Decreased balance, Decreased endurance, Decreased knowledge of use of DME, Decreased mobility, Decreased range of motion, Decreased strength, Impaired flexibility, Prosthetic Dependency, Postural dysfunction  Visit Diagnosis: 1. Unsteadiness on feet   2. Abnormal posture   3. Other abnormalities of gait and mobility   4. Muscle weakness (generalized)        Problem List Patient Active Problem List   Diagnosis Date Noted  . Neuropathic pain of right ankle 06/27/2018  . Insomnia due to medical condition 04/13/2018  . Atherosclerosis of native coronary artery of native heart without angina pectoris   . Hypokalemia   . Stage 3 chronic kidney disease ([place])   . 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 Acute blood loss anemia 02/08/2018  . Post-operative pain   . Unilateral AKA, right ([place])   . Ischemic foot 02/05/2018  . Ischemia of right lower extremity   . Pressure injury of skin 12/27/2017  . Arterial occlusive disease   . Postoperative pain   . CKD (chronic kidney disease), stage III ([place])   . History of CVA (cerebrovascular accident)   . Femoral artery thrombosis, right ([place]) 12/23/2017  . Septic arthritis of left sternoclavicular joint ([place]) 11/03/2017  . Cervical spinal cord compression ([place]) 10/30/2017  . Nasal abscess 09/25/2017  . Immunocompromised ([place]) 09/25/2017  . Acute on chronic blood loss anemia 08/05/2017  . Transient ischemic attack (TIA) 07/23/2017  . Subcortical infarction ([place])   . Right hemiparesis ([place])   . Ataxia 07/21/2017  . TIA (transient ischemic attack)   . Wheezing 06/17/2017  .  Bursitis of left elbow 05/01/2017  . Dystonia 12/29/2016  . PAD (peripheral artery disease) ([place]) 11/18/2016  . Osteoporosis 08/28/2016  . Paronychia of great toe, left 08/10/2016  . Carotid arterial disease ([place]) 07/09/2016  . Hypocalcemia 07/09/2016  . Diplopia 07/02/2016  . S/P lumbar laminectomy 03/11/2016  . Radicular pain of left lower extremity 02/21/2016  . Cough 01/10/2016  . 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 Myasthenia gravis ([place])   . Diabetes mellitus type 2 in nonobese (H[facility])   . Ataxia, post-stroke   . Gait disturbance, post-stroke   . Proximal leg weakness   . Cervical spondylosis without myelopathy   . Achilles tendinitis of right lower extremity   . Chronic diastolic CHF (congestive heart failure) ([place])   . Gastroesophageal reflux disease without esophagitis   . Cerebral infarction  involving left cerebellar artery ([place]) 06/20/2015  . Cerebellar stroke ([place])   . Ischemic stroke ([place])   . Cerebrovascular accident (CVA) due to thrombosis of left vertebral artery ([place])   . History of pulmonary embolus ([place])   . Chronic anticoagulation   . HLD (hyperlipidemia)   . Neck pain 03/19/2015  . Pain in the chest   . 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 Diabetic gastroparesis ([place]) 03/07/2015  . Screening for osteoporosis 07/19/2014  . Dyspnea 04/09/2014  . Edema 04/09/2014  . Routine general medical examination at a health care facility 10/23/2013  . Bilateral carotid bruits 05/24/2013  . Abnormal CT scan, lung 05/24/2013  . Encounter for therapeutic drug monitoring 03/14/2013  . Lymphadenopathy, inguinal 01/11/2013  . AKI (acute kidney injury) ([place]) 10/28/2012  . 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 Myasthenia gravis with exacerbation ([place]) 03/31/2012  . Disease of salivary gland 12/09/2011  . Left facial pain 10/28/2011  . Pulmonary embolism ([place]) 10/25/2011  . 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 GERD with stricture 09/23/2011  . Long term (current) use of anticoagulants 08/03/2011  . History of pulmonary embolism 07/30/2011  . History of DVT (deep vein thrombosis) 07/30/2011  . Subconjunctival hemorrhage 06/22/2011  . Bilateral conjunctivitis 04/09/2011  . Diabetes mellitus type 2, controlled ([place]) 12/18/2010  . Hearing loss 08/04/2010  . Ptosis of eyelid 12/30/2009  . Essential hypertension 04/24/2009  . HYPERLIPIDEMIA, MIXED 10/05/2008  . HEMORRHOIDS-INTERNAL 07/02/2008  . HEMORRHOIDS-EXTERNAL 07/02/2008  . DIVERTICULOSIS-COLON 07/02/2008  . PERSONAL HX COLONIC POLYPS 07/02/2008  . RASH-NONVESICULAR 02/07/2008  . PNEUMONIA 12/08/2007  . Chronic myeloid leukemia in remission ([place]) 07/07/2007  . CAD (coronary artery disease) 07/07/2007  . NEPHROLITHIASIS, HX OF 07/07/2007    Willow Ora, PTA, [facility] [address] [place],  [id number] [phone] 07/28/18, 12:05 PM   Name: CORDARRYL MONRREAL MRN: [id number] Date of Birth: [birth year]

## 2018-07-29 ENCOUNTER — Other Ambulatory Visit: Payer: Self-pay | Admitting: Neurology

## 2018-08-02 ENCOUNTER — Ambulatory Visit: Payer: Medicare Other | Admitting: Physical Therapy

## 2018-08-02 ENCOUNTER — Encounter: Payer: Self-pay | Admitting: Physical Therapy

## 2018-08-02 ENCOUNTER — Other Ambulatory Visit: Payer: Self-pay

## 2018-08-02 DIAGNOSIS — R2681 Unsteadiness on feet: Secondary | ICD-10-CM

## 2018-08-02 DIAGNOSIS — R2689 Other abnormalities of gait and mobility: Secondary | ICD-10-CM

## 2018-08-02 DIAGNOSIS — R293 Abnormal posture: Secondary | ICD-10-CM

## 2018-08-02 DIAGNOSIS — M6281 Muscle weakness (generalized): Secondary | ICD-10-CM | POA: Diagnosis not present

## 2018-08-02 DIAGNOSIS — Z9181 History of falling: Secondary | ICD-10-CM | POA: Diagnosis not present

## 2018-08-02 NOTE — Therapy (Signed)
[facility] [address] [place], [name], [address] Phone: [phone]   Fax:  [phone]  Physical Therapy Treatment  Patient Details  Name: [name] MRN: [id number] Date of Birth: [birth year] Referring Provider (PT): Kathrynn Ducking, MD   Encounter Date: 08/02/2018   CLINIC OPERATION CHANGES: [facility]patient [facility] is open at lower capacity following universal masking, social distancing, and patient screening.   PT End of Session - 08/02/18 1103    Visit Number  12    Number of Visits  20    Date for PT Re-Evaluation  08/26/18    Authorization Type  Medicare & [employer]    PT Start Time  1057    PT Stop Time  1141    PT Time Calculation (min)  44 min    Equipment Utilized During Treatment  Gait belt    Activity Tolerance  Patient tolerated treatment well    Behavior During Therapy  WFL for tasks assessed/performed       Past Medical History:  Diagnosis Date  . Bruises easily    d/t being on Eliquis  . Carotid artery disease ([place])    a. Duplex 05/2014: 13-14% RICA, 3-88% LICA, elevated velocities in right subclavian artery, normal left subclavian artery..  . Chronic back pain    HNP  . Chronic kidney disease (CKD)   . Claustrophobia    takes Ativan as needed  . 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 Coronary atherosclerosis of unspecified type of vessel, native or graft    a. cath in 2003 showed 10-20% LM, scattered 20% prox-mid LAD, 30% more distal LAD, 50-60% stenosis of LAD towards apex, 20% Cx, 30% dRCA, focal 95% stenosis in smaller of 2 branches of PDA, EF 60-65%.  . Diabetes mellitus ([place])   . Diarrhea    takes Imodium daily as needed  . Diverticulosis   . Diverticulosis of colon (without mention of hemorrhage)   . DJD (degenerative joint disease)   . DVT (deep venous thrombosis) ([place]) 07/2011  . Esophageal stricture   . Essential hypertension    takes Imdur and  Lisinopril daily  . Gastric polyps    benign  . GERD (gastroesophageal reflux disease)    takes Nexium daily  . Glaucoma    uses eye drops  . History of bronchitis    not sure when the last time  . History of colon polyps    benign  . History of kidney stones   . History of kidney stones   . Insomnia    takes Melatonin nightly as needed  . Mixed hyperlipidemia    takes Crestor daily  . Myasthenia gravis ([place])    uses prednisone  . Osteoporosis 2016  . Peripheral edema    takes Lasix daily  . Pituitary tumor    checks [facility]Walden at [facility] checks it every Jan   . Pneumonia 01/2016  . PONV (postoperative nausea and vomiting)   . Ptosis   . Pulmonary embolus ([place]) 2012  . Stroke [facility]) 06/2015    Past Surgical History:  Procedure Laterality Date  . ABDOMINAL AORTOGRAM W/LOWER EXTREMITY N/A 11/18/2016   Procedure: ABDOMINAL AORTOGRAM W/LOWER EXTREMITY;  Surgeon: [name], MD;  Location: [facility];  Service: Cardiovascular;  Laterality: N/A;  . AMPUTATION Right 02/07/2018   Procedure: right leg AMPUTATION ABOVE KNEE;  Surgeon: [name], MD;  Location: [facility];  Service: Vascular;  Laterality: Right;  . AORTOGRAM Right 12/23/2017   Procedure: AORTOGRAM, RIGHT LOWER EXTREMITY ANGIOGRAM, RIGHT SUPERFICIAL FEMORAL ARTERY ANGIOPLASTY WITH STENT, RIGHT POPLITEAL ARTERY ANGIOPLASTY WITH STENT, LEFT GROIN CUTDOWN AND LEFT FEMORAL ARTERY REPAIR;  Surgeon: [name], MD;  Location: [facility];  Service: Vascular;  Laterality: Right;  . BACK SURGERY     x2, lumbar and cervical  . CARDIAC CATHETERIZATION    . CATARACT EXTRACTION Bilateral 12/12  . COLONOSCOPY    . ESOPHAGOGASTRODUODENOSCOPY ENDOSCOPY  04/2015  . IR GENERIC HISTORICAL  03/03/2016   IR IVC FILTER PLMT / S&I [name] GUID/MOD SED 03/03/2016 [name], MD [facility]  . IR GENERIC HISTORICAL  04/09/2016   IR RADIOLOGIST EVAL & MGMT 04/09/2016 [name], MD [facility]  . IR GENERIC  HISTORICAL  04/30/2016   IR IVC FILTER RETRIEVAL / S&I [name] GUID/MOD SED 04/30/2016 [name], MD [facility]INTER[facility]  . LITHOTRIPSY    . LUMBAR LAMINECTOMY/DECOMPRESSION MICRODISCECTOMY Right 09/14/2012   Procedure: Right Lumbar three-four, four-five, Lumbar five-Sacral one decompressive laminectomy;  Surgeon: [name], MD;  Location: [facility];  Service: Neurosurgery;  Laterality: Right;  . LUMBAR LAMINECTOMY/DECOMPRESSION MICRODISCECTOMY Left 03/11/2016   Procedure: Left Lumbar Two-ThreeMicrodiscectomy;  Surgeon: [name], MD;  Location: [facility];  Service: Neurosurgery;  Laterality: Left;  . PERIPHERAL VASCULAR INTERVENTION  11/18/2016   Procedure: PERIPHERAL VASCULAR INTERVENTION;  Surgeon: [name], MD;  Location: [facility];  Service: Cardiovascular;;  Left popliteal  . SHOULDER SURGERY Left 05/07/2009  . TONSILLECTOMY      There were no vitals filed for this visit.  Subjective Assessment - 08/02/18 1059    Subjective  No new complaints. No falls. Still with left thigh/groin pain, continues to use ice massage.    Patient is accompained by:  Family member   spouse   Pertinent History   Back surgery X 4; Tonsillectomy; Cataract extraction (Bilateral, 12/12); Shoulder surgery (Left, 05/07/2009); Cardiac catheterization; Lumbar laminectomy/decompression microdiscectomy (Right, 09/14/2012); Esophagogastroduodenoscopy endoscopy (04/2015); ir generic historical (03/03/2016); Colonoscopy; Lithotripsy; Lumbar laminectomy/decompression microdiscectomy (Left, 03/11/2016); ir generic historical (04/09/2016); ir generic historical (04/30/2016); ABDOMINAL AORTOGRAM W/LOWER EXTREMITY (N/A, 11/18/2016); PERIPHERAL VASCULAR INTERVENTION (11/18/2016); Aortogram (Right, 12/23/2017); and Transfemoral Amputation (Right, 02/07/2018).    Limitations  Lifting;Standing;Walking;House hold activities    Patient Stated Goals  He wants to walk with prosthesis, get around house & back in community. Try  golf again.     Currently in Pain?  Yes    Pain Score  3     Pain Location  Other (Comment)   left thigh/groin   Pain Orientation  Left    Pain Descriptors / Indicators  Aching;Sore    Pain Type  Acute pain;Chronic pain    Pain Onset  1 to 4 weeks ago    Pain Frequency  Constant    Aggravating Factors   lifting leg in sitting    Pain Relieving Factors  ice massage, resting    Pain Score  2    Pain Location  Back    Pain Orientation  Left;Lower    Pain Descriptors / Indicators  Aching    Pain Type  Chronic pain    Pain Onset  More than a month ago    Pain Frequency  Constant    Aggravating Factors   increased activity, poor posture, OA    Pain Relieving Factors  rest, medicaited pad           [facility] Adult PT  Treatment/Exercise - 08/02/18 1103      Transfers   Transfers  Sit to Stand;Stand to Sit    Sit to Stand  4: Min guard;With upper extremity assist;From bed;From chair/3-in-1;4: Min assist    Sit to Stand Details (indicate cue type and reason)  needs UE support to balance/stabilize with standing. increased assistance needed as pt fatigued with session.     Stand to Sit  5: Supervision;With upper extremity assist;To bed;To chair/3-in-1    Stand to Sit Details  needs UE support with sitting down for controlled descent.       Ambulation/Gait   Ambulation/Gait  Yes    Ambulation/Gait Assistance  4: Min guard;4: Min assist   increased assist with fatigue   Ambulation/Gait Assistance Details  cues needed on posture, step placement, heel strike with prosthesis and weight shifting. pt with prosthetic foot catching with swing phases, requiring increased effort to advance. heel lift added to left shoe with last 2 gait reps with improved clearance/decreased effort noted. pt to call prosthetist to have height adjusted.     Ambulation Distance (Feet)  115 Feet   x2, 72 x1, 54 x1   Assistive device  Rolling walker;Prosthesis    Gait Pattern  Step-to pattern;Decreased step length -  left;Decreased stance time - right;Decreased stride length;Decreased hip/knee flexion - right;Decreased weight shift to right;Right hip hike;Right circumduction;Antalgic;Lateral hip instability;Trunk flexed;Narrow base of support;Poor foot clearance - right    Ambulation Surface  Level;Indoor      Prosthetics   Current prosthetic wear tolerance (days/week)   daily    Current prosthetic wear tolerance (#hours/day)   liner 10hrs total /day with prosthesis 4-5 hrs 2x/day    Residual limb condition   intact with no open areas,     Education Provided  Residual limb care   needs for height adjustment   Person(s) Educated  Patient;Spouse    Education Method  Explanation;Demonstration;Verbal cues    Education Method  Verbalized understanding;Needs further [place] Prosthesis  Supervision               PT Short Term Goals - 07/26/18 1134      PT SHORT TERM GOAL #1   Title  Patient demonstrates proper donning including tightening suspension strap safely without assistance or cues. (All STGs Target Date: 07/29/2018)    Baseline  07/26/18: pt continues to need cues to keep weight on prosthesis and not "bounce" on the prosthesis to sink in prior to tightening the strap.    Time  --    Period  --    Status  Partially Met    Target Date  --      PT SHORT TERM GOAL #2   Title  Patient tolerates wear of prosthesis >8hrs total /day without skin or limb pain issues.     Baseline  07/26/18: pt able to wear liner for this amount of time, only wearing prosthesis for up to 4 hours a day right now    Status  Partially Met      PT SHORT TERM GOAL #3   Title  Standing balance with RW reaches 7" and scans environment with head & trunk movements safely with no loss of balance.     Baseline  07/26/18: met today with RW support and min guard assist for safety. trunk rotation limited by back/hip pain    Status  Achieved    Target Date  07/29/18      PT SHORT TERM GOAL #  4   Title  Patient  ambulates 150' with RW & prosthesis with supervision.     Baseline  07/26/18: 115 feet today before needing to rest due to hip/back pain, min guard to min assist    Status  Partially Met    Target Date  07/29/18      PT SHORT TERM GOAL #5   Title  Patient negotiates stairs with 2 rails, ramps & curbs with RW & prosthesis with minA.     Baseline  07/26/18: met with RW/prosthesis/rails today    Time  --    Period  --    Status  Achieved    Target Date  07/29/18        PT Long Term Goals - 07/27/18 0855      PT LONG TERM GOAL #1   Title  Patient & wife verbalize & demonstrate understanding of proper prosthetic care to enable safe use of prosthesis. (All LTGs Target Date: 08/26/2018)    Time  12    Period  Weeks    Status  On-going    Target Date  08/26/18      PT LONG TERM GOAL #2   Title  Patient tolerates wearing prosthesis >80% of awake hours without skin or limb pain issues to enable function throughout his day.    Time  12    Period  Weeks    Status  Revised    Target Date  08/26/18      PT LONG TERM GOAL #3   Title  Patient standing balance with RW support modified independent: reaching 10" anteriorly, pick up item from floor & scans environment.     Time  12    Period  Weeks    Status  On-going    Target Date  08/26/18      PT LONG TERM GOAL #4   Title  Patient ambulates 150' with RW & prosthesis with supervision to enable community mobility with family.    Time  12    Period  Weeks    Status  On-going    Target Date  08/26/18      PT LONG TERM GOAL #5   Title  Patient negotiates ramps & curbs with RW & prosthesis with supervision to enable community access with family.    Time  12    Period  Weeks    Status  Revised    Target Date  08/26/18      PT LONG TERM GOAL #6   Title  Patient ambulates 19' indoors around furniture with RW & prosthesis modified independent to enable household mobility.    Time  1    Period  Months    Status  New    Target Date   08/26/18            Plan - 08/02/18 1103    Clinical Impression Statement  Today's skilled session placed emphasis on gait with RW working on gait mechanics and distance. Pt very fatigued at end of session, needing increased assistance with final gait rep and stand pivot transfer from mat table to RW. Pt to call prosthetist to have prosthesis height lowered due to catching foot with swing phase that was corrected with addition of heel lift on opposite side. The pt is progressing toward goals and should benefit from continued PT to progress toward unmet goals.    Personal Factors and Comorbidities  Age;Comorbidity 3+;Fitness;Past/Current Experience    Comorbidities  Back surgery x 4;  Tonsillectomy; Cataract extraction (Bilateral, 12/12); Shoulder surgery (Left, 05/07/2009); Cardiac catheterization; Lumbar laminectomy/decompression microdiscectomy (Right, 09/14/2012); Esophagogastroduodenoscopy endoscopy (04/2015); ir generic historical (03/03/2016); Colonoscopy; Lithotripsy; Lumbar laminectomy/decompression microdiscectomy (Left, 03/11/2016); ir generic historical (04/09/2016); ir generic historical (04/30/2016); ABDOMINAL AORTOGRAM W/LOWER EXTREMITY (N/A, 11/18/2016); PERIPHERAL VASCULAR INTERVENTION (11/18/2016); Aortogram (Right, 12/23/2017); and Amputation Transfemoral (Right, 02/07/2018).    Examination-Activity Limitations  Hygiene/Grooming;Lift;Locomotion Level;Reach Overhead;Stairs;Stand;Toileting;Transfers    Examination-Participation Restrictions  Church;Community Activity;Driving    Stability/Clinical Decision Making  Evolving/Moderate complexity    Rehab Potential  Good    PT Frequency  2x / week    PT Duration  12 weeks    PT Treatment/Interventions  ADLs/Self Care Home Management;Stair training;Gait training;DME Instruction;Functional mobility training;Therapeutic activities;Therapeutic exercise;Balance training;Neuromuscular re-education;Patient/family education;Prosthetic Training;Manual  techniques;Vestibular    PT Next Visit Plan  prosthetic gait including stairs, ramps & curbs and outdoors on paved surfaces    Consulted and Agree with Plan of Care  Patient;Family member/caregiver    Family Member Consulted  wife, Melecio Cueto       Patient will benefit from skilled therapeutic intervention in order to improve the following deficits and impairments:  Abnormal gait, Decreased activity tolerance, Decreased balance, Decreased endurance, Decreased knowledge of use of DME, Decreased mobility, Decreased range of motion, Decreased strength, Impaired flexibility, Prosthetic Dependency, Postural dysfunction  Visit Diagnosis: 1. Unsteadiness on feet   2. Abnormal posture   3. Other abnormalities of gait and mobility   4. Muscle weakness (generalized)        Problem List Patient Active Problem List   Diagnosis Date Noted  . Neuropathic pain of right ankle 06/27/2018  . Insomnia due to medical condition 04/13/2018  . Atherosclerosis of native coronary artery of native heart without angina pectoris   . Hypokalemia   . Stage 3 chronic kidney disease ([place])   . 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 Acute blood loss anemia 02/08/2018  . Post-operative pain   . Unilateral AKA, right ([place])   . Ischemic foot 02/05/2018  . Ischemia of right lower extremity   . Pressure injury of skin 12/27/2017  . Arterial occlusive disease   . Postoperative pain   . CKD (chronic kidney disease), stage III ([place])   . History of CVA (cerebrovascular accident)   . Femoral artery thrombosis, right ([place]) 12/23/2017  . Septic arthritis of left sternoclavicular joint ([place]) 11/03/2017  . Cervical spinal cord compression ([place]) 10/30/2017  . Nasal abscess 09/25/2017  . Immunocompromised ([place]) 09/25/2017  . Acute on chronic blood loss anemia 08/05/2017  . Transient ischemic attack (TIA) 07/23/2017  . Subcortical infarction ([place])   . Right hemiparesis ([place])   . Ataxia 07/21/2017  . TIA (transient  ischemic attack)   . Wheezing 06/17/2017  . Bursitis of left elbow 05/01/2017  . Dystonia 12/29/2016  . PAD (peripheral artery disease) ([place]) 11/18/2016  . Osteoporosis 08/28/2016  . Paronychia of great toe, left 08/10/2016  . Carotid arterial disease ([place]) 07/09/2016  . Hypocalcemia 07/09/2016  . Diplopia 07/02/2016  . S/P lumbar laminectomy 03/11/2016  . Radicular pain of left lower extremity 02/21/2016  . Cough 01/10/2016  . 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 Myasthenia gravis ([place])   . Diabetes mellitus type 2 in nonobese ([facility])   . Ataxia, post-stroke   . Gait disturbance, post-stroke   . Proximal leg weakness   . Cervical spondylosis without myelopathy   . Achilles tendinitis of right lower extremity   . Chronic diastolic CHF (congestive heart failure) ([place])   .  Gastroesophageal reflux disease without esophagitis   . 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 Cerebellar stroke ([place])   . Ischemic stroke ([place])   . Cerebrovascular accident (CVA) due to thrombosis of left vertebral artery ([place])   . History of pulmonary embolus ([place])   . Chronic anticoagulation   . HLD (hyperlipidemia)   . Neck pain 03/19/2015  . Pain in the chest   . 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[place])   . Diabetic gastroparesis ([place]) 03/07/2015  . Screening for osteoporosis 07/19/2014  . Dyspnea 04/09/2014  . Edema 04/09/2014  . Routine general medical examination at a health care facility 10/23/2013  . Bilateral carotid bruits 05/24/2013  . Abnormal CT scan, lung 05/24/2013  . Encounter for therapeutic drug monitoring 03/14/2013  . Lymphadenopathy, inguinal 01/11/2013  . AKI (acute kidney injury) ([place]) 10/28/2012  . 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 Myasthenia gravis with exacerbation ([place]) 03/31/2012  . Disease of salivary gland 12/09/2011  . Left facial pain 10/28/2011  . Pulmonary embolism ([place])  10/25/2011  . 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 GERD with stricture 09/23/2011  . Long term (current) use of anticoagulants 08/03/2011  . History of pulmonary embolism 07/30/2011  . History of DVT (deep vein thrombosis) 07/30/2011  . Subconjunctival hemorrhage 06/22/2011  . Bilateral conjunctivitis 04/09/2011  . Diabetes mellitus type 2, controlled ([place]) 12/18/2010  . Hearing loss 08/04/2010  . Ptosis of eyelid 12/30/2009  . Essential hypertension 04/24/2009  . HYPERLIPIDEMIA, MIXED 10/05/2008  . HEMORRHOIDS-INTERNAL 07/02/2008  . HEMORRHOIDS-EXTERNAL 07/02/2008  . DIVERTICULOSIS-COLON 07/02/2008  . PERSONAL HX COLONIC POLYPS 07/02/2008  . RASH-NONVESICULAR 02/07/2008  . PNEUMONIA 12/08/2007  . Chronic myeloid leukemia in remission ([place]) 07/07/2007  . CAD (coronary artery disease) 07/07/2007  . NEPHROLITHIASIS, HX OF 07/07/2007    Willow Ora, PTA, [facility] [address] [place], [place] [address] [phone] 08/02/18, 2:42 PM   Name: [name] MRN: [id number] Date of Birth: Nov 10, 1940

## 2018-08-04 ENCOUNTER — Encounter: Payer: Self-pay | Admitting: Physical Therapy

## 2018-08-04 ENCOUNTER — Other Ambulatory Visit: Payer: Self-pay

## 2018-08-04 ENCOUNTER — Ambulatory Visit: Payer: Medicare Other | Admitting: Physical Therapy

## 2018-08-04 DIAGNOSIS — R2689 Other abnormalities of gait and mobility: Secondary | ICD-10-CM | POA: Diagnosis not present

## 2018-08-04 DIAGNOSIS — R030 Elevated blood-pressure reading, without diagnosis of hypertension: Secondary | ICD-10-CM | POA: Diagnosis not present

## 2018-08-04 DIAGNOSIS — M4802 Spinal stenosis, cervical region: Secondary | ICD-10-CM | POA: Diagnosis not present

## 2018-08-04 DIAGNOSIS — M6281 Muscle weakness (generalized): Secondary | ICD-10-CM | POA: Diagnosis not present

## 2018-08-04 DIAGNOSIS — Z9181 History of falling: Secondary | ICD-10-CM | POA: Diagnosis not present

## 2018-08-04 DIAGNOSIS — R2681 Unsteadiness on feet: Secondary | ICD-10-CM | POA: Diagnosis not present

## 2018-08-04 DIAGNOSIS — R293 Abnormal posture: Secondary | ICD-10-CM

## 2018-08-07 NOTE — Therapy (Signed)
[facility] [address] [place], [name], [address] Phone: [phone]   Fax:  [phone]  Physical Therapy Treatment  Patient Details  Name: [name] MRN: [id number] Date of Birth: 11-Oct-1940 Referring Provider (PT): Kathrynn Ducking, MD   Encounter Date: 08/04/2018   CLINIC OPERATION CHANGES: [facility] is open at lower capacity following universal masking, social distancing, and patient screening.      08/04/18 1106  PT Visits / Re-Eval  Visit Number 13  Number of Visits 20  Date for PT Re-Evaluation 08/26/18  Authorization  Authorization Type Medicare & [employer]  PT Time Calculation  PT Start Time 1102  PT Stop Time 1143  PT Time Calculation (min) 41 min  PT - End of Session  Equipment Utilized During Treatment Gait belt  Activity Tolerance Patient tolerated treatment well  Behavior During Therapy WFL for tasks assessed/performed     Past Medical History:  Diagnosis Date  . Bruises easily    d/t being on Eliquis  . Carotid artery disease ([place])    a. Duplex 05/2014: 58-09% RICA, 9-83% LICA, elevated velocities in right subclavian artery, normal left subclavian artery..  . Chronic back pain    HNP  . Chronic kidney disease (CKD)   . Claustrophobia    takes Ativan as needed  . 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 Coronary atherosclerosis of unspecified type of vessel, native or graft    a. cath in 2003 showed 10-20% LM, scattered 20% prox-mid LAD, 30% more distal LAD, 50-60% stenosis of LAD towards apex, 20% Cx, 30% dRCA, focal 95% stenosis in smaller of 2 branches of PDA, EF 60-65%.  . Diabetes mellitus ([place])   . Diarrhea    takes Imodium daily as needed  . Diverticulosis   . Diverticulosis of colon (without mention of hemorrhage)   . DJD (degenerative joint disease)   . DVT (deep venous thrombosis) ([place]) 07/2011  . Esophageal stricture   . Essential  hypertension    takes Imdur and Lisinopril daily  . Gastric polyps    benign  . GERD (gastroesophageal reflux disease)    takes Nexium daily  . Glaucoma    uses eye drops  . History of bronchitis    not sure when the last time  . History of colon polyps    benign  . History of kidney stones   . History of kidney stones   . Insomnia    takes Melatonin nightly as needed  . Mixed hyperlipidemia    takes Crestor daily  . Myasthenia gravis ([place])    uses prednisone  . Osteoporosis 2016  . Peripheral edema    takes Lasix daily  . Pituitary tumor    checks Dr[facility]Walden at [facility] checks it every Jan   . Pneumonia 01/2016  . PONV (postoperative nausea and vomiting)   . Ptosis   . Pulmonary embolus ([place]) 2012  . Stroke [facility]) 06/2015    Past Surgical History:  Procedure Laterality Date  . ABDOMINAL AORTOGRAM W/LOWER EXTREMITY N/A 11/18/2016   Procedure: ABDOMINAL AORTOGRAM W/LOWER EXTREMITY;  Surgeon: [name], MD;  Location: [facility];  Service: Cardiovascular;  Laterality: N/A;  . AMPUTATION Right 02/07/2018   Procedure: right leg AMPUTATION ABOVE KNEE;  Surgeon: [name], MD;  Location: [facility];  Service: Vascular;  Laterality: Right;  . AORTOGRAM Right 12/23/2017   Procedure: AORTOGRAM, RIGHT LOWER EXTREMITY ANGIOGRAM, RIGHT SUPERFICIAL  FEMORAL ARTERY ANGIOPLASTY WITH STENT, RIGHT POPLITEAL ARTERY ANGIOPLASTY WITH STENT, LEFT GROIN CUTDOWN AND LEFT FEMORAL ARTERY REPAIR;  Surgeon: [name], MD;  Location: [facility];  Service: Vascular;  Laterality: Right;  . BACK SURGERY     x2, lumbar and cervical  . CARDIAC CATHETERIZATION    . CATARACT EXTRACTION Bilateral 12/12  . COLONOSCOPY    . ESOPHAGOGASTRODUODENOSCOPY ENDOSCOPY  04/2015  . IR GENERIC HISTORICAL  03/03/2016   IR IVC FILTER PLMT / S&I [name] GUID/MOD SED 03/03/2016 [name], MD [facility]  . IR GENERIC HISTORICAL  04/09/2016   IR RADIOLOGIST EVAL & MGMT 04/09/2016 [name], MD  [facility]  . IR GENERIC HISTORICAL  04/30/2016   IR IVC FILTER RETRIEVAL / S&I [name] GUID/MOD SED 04/30/2016 [name], MD [facility]INTER[facility]  . LITHOTRIPSY    . LUMBAR LAMINECTOMY/DECOMPRESSION MICRODISCECTOMY Right 09/14/2012   Procedure: Right Lumbar three-four, four-five, Lumbar five-Sacral one decompressive laminectomy;  Surgeon: [name], MD;  Location: [facility];  Service: Neurosurgery;  Laterality: Right;  . LUMBAR LAMINECTOMY/DECOMPRESSION MICRODISCECTOMY Left 03/11/2016   Procedure: Left Lumbar Two-ThreeMicrodiscectomy;  Surgeon: [name], MD;  Location: [facility];  Service: Neurosurgery;  Laterality: Left;  . PERIPHERAL VASCULAR INTERVENTION  11/18/2016   Procedure: PERIPHERAL VASCULAR INTERVENTION;  Surgeon: [name], MD;  Location: [facility];  Service: Cardiovascular;;  Left popliteal  . SHOULDER SURGERY Left 05/07/2009  . TONSILLECTOMY      There were no vitals filed for this visit.     08/04/18 1106  Symptoms/Limitations  Subjective Saw the Neurosurgeon this morning. Having nerver conduction study on 08/22/18. Also having an MRI done, has not been scheduled as yet, all due to possibility of needing cervical surgery. No falls. Was "wore out" after last session. Has appt with Fritz Pickerel today at 3pm for prosthetic adjustments as needed.   Pertinent History  Back surgery X 4; Tonsillectomy; Cataract extraction (Bilateral, 12/12); Shoulder surgery (Left, 05/07/2009); Cardiac catheterization; Lumbar laminectomy/decompression microdiscectomy (Right, 09/14/2012); Esophagogastroduodenoscopy endoscopy (04/2015); ir generic historical (03/03/2016); Colonoscopy; Lithotripsy; Lumbar laminectomy/decompression microdiscectomy (Left, 03/11/2016); ir generic historical (04/09/2016); ir generic historical (04/30/2016); ABDOMINAL AORTOGRAM W/LOWER EXTREMITY (N/A, 11/18/2016); PERIPHERAL VASCULAR INTERVENTION (11/18/2016); Aortogram (Right, 12/23/2017); and Transfemoral Amputation  (Right, 02/07/2018).  Limitations Lifting;Standing;Walking;House hold activities  Patient Stated Goals He wants to walk with prosthesis, get around house & back in community. Try golf again.   Pain Assessment  Currently in Pain? Yes  Pain Score 3  Pain Location Leg (thigh/groin)  Pain Orientation Left  Pain Descriptors / Indicators Aching;Sore  Pain Type Acute pain;Chronic pain  Pain Onset 1 to 4 weeks ago  Pain Frequency Constant  Aggravating Factors  lifting leg in sitting  Pain Relieving Factors ice massage, resting  2nd Pain Site  Pain Score 3  Pain Location Back  Pain Orientation Left;Lower  Pain Descriptors / Indicators Aching  Pain Type Chronic pain  Pain Onset More than a month ago  Pain Frequency Constant  Aggravating Factors  increased activity, poor posture, OA  Pain Relieving Factors rest, medicated pad, tramadol      08/04/18 1110  Transfers  Transfers Sit to Stand;Stand to [employer] Transfers  Sit to Stand 4: Min guard;With upper extremity assist;From bed;From chair/3-in-1;4: Min assist  Sit to Stand Details (indicate cue type and reason) UE support needed for stability to stand and balance  Stand to Sit 4: Min guard;With upper extremity assist;To chair/3-in-1  Stand to Sit Details UE support  needed for stability with sitting down. assist needed to unlock prosthesis after 1st gait rep, not again with session, just increased time needed.   Stand Pivot Transfers 4: Min guard  Stand Pivot Transfer Details (indicate cue type and reason) min guard for transfer from wheelchair to seat of SUV. min assist to get onto high seat safety with cues to use dashboard and seat surface. min guard to get off seat to RW. back up onto seat a second time with min assist (less assist than with 1st rep).  Ambulation/Gait  Ambulation/Gait Yes  Ambulation/Gait Assistance 4: Min assist;3: Mod assist  Ambulation/Gait Assistance Details cues on posture, step placement, base of support  and weight shifting needed. pt with increased fatigue with gait today.  Ambulation Distance (Feet) 70 Feet (x1, 40 x1)  Assistive device Rolling walker;Prosthesis  Gait Pattern Step-to pattern;Decreased step length - left;Decreased stance time - right;Decreased stride length;Decreased hip/knee flexion - right;Decreased weight shift to right;Right hip hike;Right circumduction;Antalgic;Lateral hip instability;Trunk flexed;Narrow base of support;Poor foot clearance - right  Ambulation Surface Level;Indoor  Knee/Hip Exercises: Aerobic  Other Aerobic Scifit with UE/LE's at level 2.0 for 8 minutes with goal >/=60 rpm for strengthening adn activity tolerance.   Prosthetics  Prosthetic Care Comments  pt to work toward wearing the leg 6 hours a day.   Current prosthetic wear tolerance (days/week)  daily  Current prosthetic wear tolerance (#hours/day)  liner 10hrs total /day with prosthesis 4-5 hrs 2x/day  Residual limb condition  intact with no open areas,   Education Provided Residual limb care;Proper wear schedule/adjustment;Proper weight-bearing schedule/adjustment (car transfer with prosthesis on)  Person(s) Educated Patient;Spouse  Education Method Explanation;Demonstration;Verbal cues  Education Method Verbalized understanding;Returned demonstration;Verbal cues required;Needs further instruction  Donning Prosthesis 5       PT Short Term Goals - 07/26/18 1134      PT SHORT TERM GOAL #1   Title  Patient demonstrates proper donning including tightening suspension strap safely without assistance or cues. (All STGs Target Date: 07/29/2018)    Baseline  07/26/18: pt continues to need cues to keep weight on prosthesis and not "bounce" on the prosthesis to sink in prior to tightening the strap.    Time  --    Period  --    Status  Partially Met    Target Date  --      PT SHORT TERM GOAL #2   Title  Patient tolerates wear of prosthesis >8hrs total /day without skin or limb pain issues.      Baseline  07/26/18: pt able to wear liner for this amount of time, only wearing prosthesis for up to 4 hours a day right now    Status  Partially Met      PT SHORT TERM GOAL #3   Title  Standing balance with RW reaches 7" and scans environment with head & trunk movements safely with no loss of balance.     Baseline  07/26/18: met today with RW support and min guard assist for safety. trunk rotation limited by back/hip pain    Status  Achieved    Target Date  07/29/18      PT SHORT TERM GOAL #4   Title  Patient ambulates 150' with RW & prosthesis with supervision.     Baseline  07/26/18: 115 feet today before needing to rest due to hip/back pain, min guard to min assist    Status  Partially Met    Target Date  07/29/18  PT SHORT TERM GOAL #5   Title  Patient negotiates stairs with 2 rails, ramps & curbs with RW & prosthesis with minA.     Baseline  07/26/18: met with RW/prosthesis/rails today    Time  --    Period  --    Status  Achieved    Target Date  07/29/18        PT Long Term Goals - 07/27/18 0855      PT LONG TERM GOAL #1   Title  Patient & wife verbalize & demonstrate understanding of proper prosthetic care to enable safe use of prosthesis. (All LTGs Target Date: 08/26/2018)    Time  12    Period  Weeks    Status  On-going    Target Date  08/26/18      PT LONG TERM GOAL #2   Title  Patient tolerates wearing prosthesis >80% of awake hours without skin or limb pain issues to enable function throughout his day.    Time  12    Period  Weeks    Status  Revised    Target Date  08/26/18      PT LONG TERM GOAL #3   Title  Patient standing balance with RW support modified independent: reaching 10" anteriorly, pick up item from floor & scans environment.     Time  12    Period  Weeks    Status  On-going    Target Date  08/26/18      PT LONG TERM GOAL #4   Title  Patient ambulates 150' with RW & prosthesis with supervision to enable community mobility with family.     Time  12    Period  Weeks    Status  On-going    Target Date  08/26/18      PT LONG TERM GOAL #5   Title  Patient negotiates ramps & curbs with RW & prosthesis with supervision to enable community access with family.    Time  12    Period  Weeks    Status  Revised    Target Date  08/26/18      PT LONG TERM GOAL #6   Title  Patient ambulates 54' indoors around furniture with RW & prosthesis modified independent to enable household mobility.    Time  1    Period  Months    Status  New    Target Date  08/26/18         08/04/18 1106  Plan  Clinical Impression Statement Pt with decreased activity tolerance today with increased fatigue. Frequent rest breaks needed with decreased gait. Did review car transfers with wearing prosthesis in hopes to transition pt into wearing prosthesis into/out of therapy. The pt is progressing toward goals and should benefit from continued PT to progress toward unmet goals.  Personal Factors and Comorbidities Age;Comorbidity 3+;Fitness;Past/Current Experience  Comorbidities Back surgery x 4; Tonsillectomy; Cataract extraction (Bilateral, 12/12); Shoulder surgery (Left, 05/07/2009); Cardiac catheterization; Lumbar laminectomy/decompression microdiscectomy (Right, 09/14/2012); Esophagogastroduodenoscopy endoscopy (04/2015); ir generic historical (03/03/2016); Colonoscopy; Lithotripsy; Lumbar laminectomy/decompression microdiscectomy (Left, 03/11/2016); ir generic historical (04/09/2016); ir generic historical (04/30/2016); ABDOMINAL AORTOGRAM W/LOWER EXTREMITY (N/A, 11/18/2016); PERIPHERAL VASCULAR INTERVENTION (11/18/2016); Aortogram (Right, 12/23/2017); and Amputation Transfemoral (Right, 02/07/2018).  Examination-Activity Limitations Hygiene/Grooming;Lift;Locomotion Level;Reach Overhead;Stairs;Stand;Toileting;Transfers  Examination-Participation Restrictions Church;Community Activity;Driving  Pt will benefit from skilled therapeutic intervention in order to improve on  the following deficits Abnormal gait;Decreased activity tolerance;Decreased balance;Decreased endurance;Decreased knowledge of use of DME;Decreased mobility;Decreased range of motion;Decreased strength;Impaired  flexibility;Prosthetic Dependency;Postural dysfunction  Stability/Clinical Decision Making Evolving/Moderate complexity  Rehab Potential Good  PT Frequency 2x / week  PT Duration 12 weeks  PT Treatment/Interventions ADLs/Self Care Home Management;Stair training;Gait training;DME Instruction;Functional mobility training;Therapeutic activities;Therapeutic exercise;Balance training;Neuromuscular re-education;Patient/family education;Prosthetic Training;Manual techniques;Vestibular  PT Next Visit Plan prosthetic gait including stairs, ramps & curbs and outdoors on paved surfaces  Consulted and Agree with Plan of Care Patient;Family member/caregiver  Family Member Consulted wife, Roczen Waymire          Patient will benefit from skilled therapeutic intervention in order to improve the following deficits and impairments:  Abnormal gait, Decreased activity tolerance, Decreased balance, Decreased endurance, Decreased knowledge of use of DME, Decreased mobility, Decreased range of motion, Decreased strength, Impaired flexibility, Prosthetic Dependency, Postural dysfunction  Visit Diagnosis: 1. Unsteadiness on feet   2. Abnormal posture   3. Other abnormalities of gait and mobility   4. Muscle weakness (generalized)        Problem List Patient Active Problem List   Diagnosis Date Noted  . Neuropathic pain of right ankle 06/27/2018  . Insomnia due to medical condition 04/13/2018  . Atherosclerosis of native coronary artery of native heart without angina pectoris   . Hypokalemia   . Stage 3 chronic kidney disease ([place])   . 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 Acute blood loss anemia 02/08/2018  . Post-operative pain   . Unilateral AKA, right ([place])   . Ischemic foot 02/05/2018  .  Ischemia of right lower extremity   . Pressure injury of skin 12/27/2017  . Arterial occlusive disease   . Postoperative pain   . CKD (chronic kidney disease), stage III ([place])   . History of CVA (cerebrovascular accident)   . Femoral artery thrombosis, right ([place]) 12/23/2017  . Septic arthritis of left sternoclavicular joint ([place]) 11/03/2017  . Cervical spinal cord compression ([place]) 10/30/2017  . Nasal abscess 09/25/2017  . Immunocompromised ([place]) 09/25/2017  . Acute on chronic blood loss anemia 08/05/2017  . Transient ischemic attack (TIA) 07/23/2017  . Subcortical infarction ([place])   . Right hemiparesis ([place])   . Ataxia 07/21/2017  . TIA (transient ischemic attack)   . Wheezing 06/17/2017  . Bursitis of left elbow 05/01/2017  . Dystonia 12/29/2016  . PAD (peripheral artery disease) ([place]) 11/18/2016  . Osteoporosis 08/28/2016  . Paronychia of great toe, left 08/10/2016  . Carotid arterial disease ([place]) 07/09/2016  . Hypocalcemia 07/09/2016  . Diplopia 07/02/2016  . S/P lumbar laminectomy 03/11/2016  . Radicular pain of left lower extremity 02/21/2016  . Cough 01/10/2016  . 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 Myasthenia gravis ([place])   . Diabetes mellitus type 2 in nonobese (H[facility])   . Ataxia, post-stroke   . Gait disturbance, post-stroke   . Proximal leg weakness   . Cervical spondylosis without myelopathy   . Achilles tendinitis of right lower extremity   . Chronic diastolic CHF (congestive heart failure) ([place])   . Gastroesophageal reflux disease without esophagitis   . 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 Cerebellar stroke ([place])   . Ischemic stroke ([place])   . Cerebrovascular accident (CVA) due to thrombosis of left vertebral artery ([place])   . History of pulmonary embolus ([place])   . Chronic anticoagulation   . HLD (hyperlipidemia)   . Neck pain 03/19/2015  . Pain in the chest   . Well controlled type 2 diabetes  mellitus with gastroparesis ([place])   . Diabetic gastroparesis (  [place]) 03/07/2015  . Screening for osteoporosis 07/19/2014  . Dyspnea 04/09/2014  . Edema 04/09/2014  . Routine general medical examination at a health care facility 10/23/2013  . Bilateral carotid bruits 05/24/2013  . Abnormal CT scan, lung 05/24/2013  . Encounter for therapeutic drug monitoring 03/14/2013  . Lymphadenopathy, inguinal 01/11/2013  . AKI (acute kidney injury) ([place]) 10/28/2012  . 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 Myasthenia gravis with exacerbation ([place]) 03/31/2012  . Disease of salivary gland 12/09/2011  . Left facial pain 10/28/2011  . Pulmonary embolism ([place]) 10/25/2011  . 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 GERD with stricture 09/23/2011  . Long term (current) use of anticoagulants 08/03/2011  . History of pulmonary embolism 07/30/2011  . History of DVT (deep vein thrombosis) 07/30/2011  . Subconjunctival hemorrhage 06/22/2011  . Bilateral conjunctivitis 04/09/2011  . Diabetes mellitus type 2, controlled ([place]) 12/18/2010  . Hearing loss 08/04/2010  . Ptosis of eyelid 12/30/2009  . Essential hypertension 04/24/2009  . HYPERLIPIDEMIA, MIXED 10/05/2008  . HEMORRHOIDS-INTERNAL 07/02/2008  . HEMORRHOIDS-EXTERNAL 07/02/2008  . DIVERTICULOSIS-COLON 07/02/2008  . PERSONAL HX COLONIC POLYPS 07/02/2008  . RASH-NONVESICULAR 02/07/2008  . PNEUMONIA 12/08/2007  . Chronic myeloid leukemia in remission ([place]) 07/07/2007  . CAD (coronary artery disease) 07/07/2007  . NEPHROLITHIASIS, HX OF 07/07/2007   Willow Ora, PTA, [facility] [address] [place],  [address] [phone] 08/07/18, 2:17 PM   Name: [name] MRN: [id number] Date of Birth: 05/31/1940

## 2018-08-09 ENCOUNTER — Other Ambulatory Visit: Payer: Self-pay

## 2018-08-09 ENCOUNTER — Other Ambulatory Visit: Payer: Self-pay | Admitting: Student

## 2018-08-09 ENCOUNTER — Ambulatory Visit: Payer: Medicare Other | Admitting: Physical Therapy

## 2018-08-09 ENCOUNTER — Encounter: Payer: Self-pay | Admitting: Physical Therapy

## 2018-08-09 DIAGNOSIS — Z9181 History of falling: Secondary | ICD-10-CM | POA: Diagnosis not present

## 2018-08-09 DIAGNOSIS — R2689 Other abnormalities of gait and mobility: Secondary | ICD-10-CM | POA: Diagnosis not present

## 2018-08-09 DIAGNOSIS — M6281 Muscle weakness (generalized): Secondary | ICD-10-CM

## 2018-08-09 DIAGNOSIS — R293 Abnormal posture: Secondary | ICD-10-CM | POA: Diagnosis not present

## 2018-08-09 DIAGNOSIS — R2681 Unsteadiness on feet: Secondary | ICD-10-CM | POA: Diagnosis not present

## 2018-08-09 DIAGNOSIS — M4802 Spinal stenosis, cervical region: Secondary | ICD-10-CM

## 2018-08-09 NOTE — Therapy (Signed)
[facility] [address] [place], [name], [address] Phone: [phone]   Fax:  [phone]  Physical Therapy Treatment  Patient Details  Name: [name] MRN: [id number] Date of Birth: [birth year] Referring Provider (PT): Kathrynn Ducking, MD   Encounter Date: 08/09/2018   CLINIC OPERATION CHANGES: [facility] is open at lower capacity following universal masking, social distancing, and patient screening.  The patient's COVID risk of complications score is 9.   PT End of Session - 08/09/18 1225    Visit Number  14    Number of Visits  20    Date for PT Re-Evaluation  08/26/18    Authorization Type  Medicare & [employer]    PT Start Time  1100    PT Stop Time  1145    PT Time Calculation (min)  45 min    Equipment Utilized During Treatment  Gait belt    Activity Tolerance  Patient tolerated treatment well    Behavior During Therapy  WFL for tasks assessed/performed       Past Medical History:  Diagnosis Date  . Bruises easily    d/t being on Eliquis  . Carotid artery disease ([place])    a. Duplex 05/2014: 50-38% RICA, 8-82% LICA, elevated velocities in right subclavian artery, normal left subclavian artery..  . Chronic back pain    HNP  . Chronic kidney disease (CKD)   . Claustrophobia    takes Ativan as needed  . 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 Coronary atherosclerosis of unspecified type of vessel, native or graft    a. cath in 2003 showed 10-20% LM, scattered 20% prox-mid LAD, 30% more distal LAD, 50-60% stenosis of LAD towards apex, 20% Cx, 30% dRCA, focal 95% stenosis in smaller of 2 branches of PDA, EF 60-65%.  . Diabetes mellitus ([place])   . Diarrhea    takes Imodium daily as needed  . Diverticulosis   . Diverticulosis of colon (without mention of hemorrhage)   . DJD (degenerative joint disease)   . DVT (deep venous thrombosis) ([place]) 07/2011  . Esophageal  stricture   . Essential hypertension    takes Imdur and Lisinopril daily  . Gastric polyps    benign  . GERD (gastroesophageal reflux disease)    takes Nexium daily  . Glaucoma    uses eye drops  . History of bronchitis    not sure when the last time  . History of colon polyps    benign  . History of kidney stones   . History of kidney stones   . Insomnia    takes Melatonin nightly as needed  . Mixed hyperlipidemia    takes Crestor daily  . Myasthenia gravis ([place])    uses prednisone  . Osteoporosis 2016  . Peripheral edema    takes Lasix daily  . Pituitary tumor    checks Dr[facility]Walden at [facility] checks it every Jan   . Pneumonia 01/2016  . PONV (postoperative nausea and vomiting)   . Ptosis   . Pulmonary embolus ([place]) 2012  . Stroke [facility]) 06/2015    Past Surgical History:  Procedure Laterality Date  . ABDOMINAL AORTOGRAM W/LOWER EXTREMITY N/A 11/18/2016   Procedure: ABDOMINAL AORTOGRAM W/LOWER EXTREMITY;  Surgeon: [name], MD;  Location: [facility];  Service: Cardiovascular;  Laterality: N/A;  . AMPUTATION Right 02/07/2018   Procedure: right leg AMPUTATION ABOVE KNEE;  Surgeon: [name]  S, MD;  Location: [facility];  Service: Vascular;  Laterality: Right;  . AORTOGRAM Right 12/23/2017   Procedure: AORTOGRAM, RIGHT LOWER EXTREMITY ANGIOGRAM, RIGHT SUPERFICIAL FEMORAL ARTERY ANGIOPLASTY WITH STENT, RIGHT POPLITEAL ARTERY ANGIOPLASTY WITH STENT, LEFT GROIN CUTDOWN AND LEFT FEMORAL ARTERY REPAIR;  Surgeon: [name], MD;  Location: [facility];  Service: Vascular;  Laterality: Right;  . BACK SURGERY     x2, lumbar and cervical  . CARDIAC CATHETERIZATION    . CATARACT EXTRACTION Bilateral 12/12  . COLONOSCOPY    . ESOPHAGOGASTRODUODENOSCOPY ENDOSCOPY  04/2015  . IR GENERIC HISTORICAL  03/03/2016   IR IVC FILTER PLMT / S&I [name] GUID/MOD SED 03/03/2016 [name], MD [facility]  . IR GENERIC HISTORICAL  04/09/2016   IR RADIOLOGIST EVAL & MGMT  04/09/2016 [name], MD [facility]  . IR GENERIC HISTORICAL  04/30/2016   IR IVC FILTER RETRIEVAL / S&I [name] GUID/MOD SED 04/30/2016 [name], MD [facility]INTER[facility]  . LITHOTRIPSY    . LUMBAR LAMINECTOMY/DECOMPRESSION MICRODISCECTOMY Right 09/14/2012   Procedure: Right Lumbar three-four, four-five, Lumbar five-Sacral one decompressive laminectomy;  Surgeon: [name], MD;  Location: [facility];  Service: Neurosurgery;  Laterality: Right;  . LUMBAR LAMINECTOMY/DECOMPRESSION MICRODISCECTOMY Left 03/11/2016   Procedure: Left Lumbar Two-ThreeMicrodiscectomy;  Surgeon: [name], MD;  Location: [facility];  Service: Neurosurgery;  Laterality: Left;  . PERIPHERAL VASCULAR INTERVENTION  11/18/2016   Procedure: PERIPHERAL VASCULAR INTERVENTION;  Surgeon: [name], MD;  Location: [facility];  Service: Cardiovascular;;  Left popliteal  . SHOULDER SURGERY Left 05/07/2009  . TONSILLECTOMY      There were no vitals filed for this visit.  Subjective Assessment - 08/09/18 1100    Subjective  He has been wearing prosthesis limited due to left inner thigh pain.    Pertinent History   Back surgery X 4; Tonsillectomy; Cataract extraction (Bilateral, 12/12); Shoulder surgery (Left, 05/07/2009); Cardiac catheterization; Lumbar laminectomy/decompression microdiscectomy (Right, 09/14/2012); Esophagogastroduodenoscopy endoscopy (04/2015); ir generic historical (03/03/2016); Colonoscopy; Lithotripsy; Lumbar laminectomy/decompression microdiscectomy (Left, 03/11/2016); ir generic historical (04/09/2016); ir generic historical (04/30/2016); ABDOMINAL AORTOGRAM W/LOWER EXTREMITY (N/A, 11/18/2016); PERIPHERAL VASCULAR INTERVENTION (11/18/2016); Aortogram (Right, 12/23/2017); and Transfemoral Amputation (Right, 02/07/2018).    Limitations  Lifting;Standing;Walking;House hold activities    Patient Stated Goals  He wants to walk with prosthesis, get around house & back in community. Try golf again.      Currently in Pain?  Yes    Pain Score  5     Pain Location  Groin   inner thigh and anterior groin   Pain Orientation  Left    Pain Descriptors / Indicators  Aching    Pain Onset  1 to 4 weeks ago    Pain Frequency  Constant    Aggravating Factors   moving left leg and at night    Pain Relieving Factors  medication    Pain Onset  More than a month ago       PT assessed left hip pain. Reports pain medial left hip running linearly but medial to adductor longus muscle.  He reports now pain is sometimes in hip joint & radiates to lateral hip. PT worked on donning liner with invert liner more completely. Patient reports increased weakness of hands with cervical issues & difficulty fully inverting liner now.  Pt stood with prosthesis & RW with supervision. Pain in left hip increased with weight bearing so PT stopped session. PT instructed patient & wife to not  stand or ambulate until Dr. Gladstone Lighter can assess left hip including r/o hip fracture. Both verbalized understanding.                           PT Short Term Goals - 07/26/18 1134      PT SHORT TERM GOAL #1   Title  Patient demonstrates proper donning including tightening suspension strap safely without assistance or cues. (All STGs Target Date: 07/29/2018)    Baseline  07/26/18: pt continues to need cues to keep weight on prosthesis and not "bounce" on the prosthesis to sink in prior to tightening the strap.    Time  --    Period  --    Status  Partially Met    Target Date  --      PT SHORT TERM GOAL #2   Title  Patient tolerates wear of prosthesis >8hrs total /day without skin or limb pain issues.     Baseline  07/26/18: pt able to wear liner for this amount of time, only wearing prosthesis for up to 4 hours a day right now    Status  Partially Met      PT SHORT TERM GOAL #3   Title  Standing balance with RW reaches 7" and scans environment with head & trunk movements safely with no loss of balance.      Baseline  07/26/18: met today with RW support and min guard assist for safety. trunk rotation limited by back/hip pain    Status  Achieved    Target Date  07/29/18      PT SHORT TERM GOAL #4   Title  Patient ambulates 150' with RW & prosthesis with supervision.     Baseline  07/26/18: 115 feet today before needing to rest due to hip/back pain, min guard to min assist    Status  Partially Met    Target Date  07/29/18      PT SHORT TERM GOAL #5   Title  Patient negotiates stairs with 2 rails, ramps & curbs with RW & prosthesis with minA.     Baseline  07/26/18: met with RW/prosthesis/rails today    Time  --    Period  --    Status  Achieved    Target Date  07/29/18        PT Long Term Goals - 07/27/18 0855      PT LONG TERM GOAL #1   Title  Patient & wife verbalize & demonstrate understanding of proper prosthetic care to enable safe use of prosthesis. (All LTGs Target Date: 08/26/2018)    Time  12    Period  Weeks    Status  On-going    Target Date  08/26/18      PT LONG TERM GOAL #2   Title  Patient tolerates wearing prosthesis >80% of awake hours without skin or limb pain issues to enable function throughout his day.    Time  12    Period  Weeks    Status  Revised    Target Date  08/26/18      PT LONG TERM GOAL #3   Title  Patient standing balance with RW support modified independent: reaching 10" anteriorly, pick up item from floor & scans environment.     Time  12    Period  Weeks    Status  On-going    Target Date  08/26/18      PT LONG TERM  GOAL #4   Title  Patient ambulates 150' with RW & prosthesis with supervision to enable community mobility with family.    Time  12    Period  Weeks    Status  On-going    Target Date  08/26/18      PT LONG TERM GOAL #5   Title  Patient negotiates ramps & curbs with RW & prosthesis with supervision to enable community access with family.    Time  12    Period  Weeks    Status  Revised    Target Date  08/26/18      PT LONG  TERM GOAL #6   Title  Patient ambulates 80' indoors around furniture with RW & prosthesis modified independent to enable household mobility.    Time  1    Period  Months    Status  New    Target Date  08/26/18            Plan - 08/09/18 2231    Clinical Impression Statement  Patient's left hip pain continues to be persistant and interferring with mobility. Pain could be consistent with hip fracture. Body language and patient discription of limitations indicate pain may be higher than 5/10 that pt reports. Patient reports pain is now sometimes in hip joint & laterally.  Patient has not responded to treatment of pain as muscle strain. PT set-up appointment with his orthopedist, Dr. Gladstone Lighter for 08/11/2018 at 8:30.    Personal Factors and Comorbidities  Age;Comorbidity 3+;Fitness;Past/Current Experience    Comorbidities  Back surgery x 4; Tonsillectomy; Cataract extraction (Bilateral, 12/12); Shoulder surgery (Left, 05/07/2009); Cardiac catheterization; Lumbar laminectomy/decompression microdiscectomy (Right, 09/14/2012); Esophagogastroduodenoscopy endoscopy (04/2015); ir generic historical (03/03/2016); Colonoscopy; Lithotripsy; Lumbar laminectomy/decompression microdiscectomy (Left, 03/11/2016); ir generic historical (04/09/2016); ir generic historical (04/30/2016); ABDOMINAL AORTOGRAM W/LOWER EXTREMITY (N/A, 11/18/2016); PERIPHERAL VASCULAR INTERVENTION (11/18/2016); Aortogram (Right, 12/23/2017); and Amputation Transfemoral (Right, 02/07/2018).    Examination-Activity Limitations  Hygiene/Grooming;Lift;Locomotion Level;Reach Overhead;Stairs;Stand;Toileting;Transfers    Examination-Participation Restrictions  Church;Community Activity;Driving    Stability/Clinical Decision Making  Evolving/Moderate complexity    Rehab Potential  Good    PT Frequency  2x / week    PT Duration  12 weeks    PT Treatment/Interventions  ADLs/Self Care Home Management;Stair training;Gait training;DME Instruction;Functional  mobility training;Therapeutic activities;Therapeutic exercise;Balance training;Neuromuscular re-education;Patient/family education;Prosthetic Training;Manual techniques;Vestibular    PT Next Visit Plan  PT set-up appt with Dr. Gladstone Lighter to assess pain & r/o possible hip fx,  Hold PT until after patient sees orthopedist, if cleared PT will progress with prosthetic gait with RW    Consulted and Agree with Plan of Care  Patient;Family member/caregiver    Family Member Consulted  wife, Teoman Giraud       Patient will benefit from skilled therapeutic intervention in order to improve the following deficits and impairments:  Abnormal gait, Decreased activity tolerance, Decreased balance, Decreased endurance, Decreased knowledge of use of DME, Decreased mobility, Decreased range of motion, Decreased strength, Impaired flexibility, Prosthetic Dependency, Postural dysfunction  Visit Diagnosis: 1. Unsteadiness on feet   2. Abnormal posture   3. Other abnormalities of gait and mobility   4. Muscle weakness (generalized)        Problem List Patient Active Problem List   Diagnosis Date Noted  . Neuropathic pain of right ankle 06/27/2018  . Insomnia due to medical condition 04/13/2018  . Atherosclerosis of native coronary artery of native heart without angina pectoris   . Hypokalemia   . Stage 3 chronic kidney  disease ([place])   . 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 Acute blood loss anemia 02/08/2018  . Post-operative pain   . Unilateral AKA, right ([place])   . Ischemic foot 02/05/2018  . Ischemia of right lower extremity   . Pressure injury of skin 12/27/2017  . Arterial occlusive disease   . Postoperative pain   . CKD (chronic kidney disease), stage III ([place])   . History of CVA (cerebrovascular accident)   . Femoral artery thrombosis, right ([place]) 12/23/2017  . Septic arthritis of left sternoclavicular joint ([place]) 11/03/2017  . Cervical spinal cord compression ([place]) 10/30/2017  . Nasal abscess  09/25/2017  . Immunocompromised ([place]) 09/25/2017  . Acute on chronic blood loss anemia 08/05/2017  . Transient ischemic attack (TIA) 07/23/2017  . Subcortical infarction ([place])   . Right hemiparesis ([place])   . Ataxia 07/21/2017  . TIA (transient ischemic attack)   . Wheezing 06/17/2017  . Bursitis of left elbow 05/01/2017  . Dystonia 12/29/2016  . PAD (peripheral artery disease) ([place]) 11/18/2016  . Osteoporosis 08/28/2016  . Paronychia of great toe, left 08/10/2016  . Carotid arterial disease ([place]) 07/09/2016  . Hypocalcemia 07/09/2016  . Diplopia 07/02/2016  . S/P lumbar laminectomy 03/11/2016  . Radicular pain of left lower extremity 02/21/2016  . Cough 01/10/2016  . 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 Myasthenia gravis ([place])   . Diabetes mellitus type 2 in nonobese (HCC)   . Ataxia, post-stroke   . Gait disturbance, post-stroke   . Proximal leg weakness   . Cervical spondylosis without myelopathy   . Achilles tendinitis of right lower extremity   . Chronic diastolic CHF (congestive heart failure) ([place])   . Gastroesophageal reflux disease without esophagitis   . 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 Cerebellar stroke ([place])   . Ischemic stroke ([place])   . Cerebrovascular accident (CVA) due to thrombosis of left vertebral artery ([place])   . History of pulmonary embolus ([place])   . Chronic anticoagulation   . HLD (hyperlipidemia)   . Neck pain 03/19/2015  . Pain in the chest   . 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[place])   . Diabetic gastroparesis ([place]) 03/07/2015  . Screening for osteoporosis 07/19/2014  . Dyspnea 04/09/2014  . Edema 04/09/2014  . Routine general medical examination at a health care facility 10/23/2013  . Bilateral carotid bruits 05/24/2013  . Abnormal CT scan, lung 05/24/2013  . Encounter for therapeutic drug monitoring 03/14/2013  . Lymphadenopathy, inguinal 01/11/2013  .  AKI (acute kidney injury) ([place]) 10/28/2012  . 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 Myasthenia gravis with exacerbation ([place]) 03/31/2012  . Disease of salivary gland 12/09/2011  . Left facial pain 10/28/2011  . Pulmonary embolism ([place]) 10/25/2011  . 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 GERD with stricture 09/23/2011  . Long term (current) use of anticoagulants 08/03/2011  . History of pulmonary embolism 07/30/2011  . History of DVT (deep vein thrombosis) 07/30/2011  . Subconjunctival hemorrhage 06/22/2011  . Bilateral conjunctivitis 04/09/2011  . Diabetes mellitus type 2, controlled ([place]) 12/18/2010  . Hearing loss 08/04/2010  . Ptosis of eyelid 12/30/2009  . Essential hypertension 04/24/2009  . HYPERLIPIDEMIA, MIXED 10/05/2008  . HEMORRHOIDS-INTERNAL 07/02/2008  . HEMORRHOIDS-EXTERNAL 07/02/2008  . DIVERTICULOSIS-COLON 07/02/2008  . PERSONAL HX COLONIC POLYPS 07/02/2008  . RASH-NONVESICULAR 02/07/2008  . PNEUMONIA 12/08/2007  . Chronic myeloid leukemia in remission ([place]) 07/07/2007  . CAD (coronary artery disease) 07/07/2007  .  NEPHROLITHIASIS, HX OF 07/07/2007    Jamey Reas PT, DPT 08/09/2018, 10:37 PM  [facility] [address] [place], [name], [address] Phone: [phone]   Fax:  [phone]  Name: [name] MRN: [id number] Date of Birth: 02-02-1941

## 2018-08-10 ENCOUNTER — Ambulatory Visit: Payer: Medicare Other | Admitting: Physical Therapy

## 2018-08-11 ENCOUNTER — Encounter: Payer: Medicare Other | Admitting: Physical Therapy

## 2018-08-11 DIAGNOSIS — M25552 Pain in left hip: Secondary | ICD-10-CM | POA: Diagnosis not present

## 2018-08-13 ENCOUNTER — Other Ambulatory Visit: Payer: Self-pay | Admitting: Neurology

## 2018-08-14 ENCOUNTER — Encounter: Payer: Self-pay | Admitting: Family Medicine

## 2018-08-15 ENCOUNTER — Emergency Department (HOSPITAL_BASED_OUTPATIENT_CLINIC_OR_DEPARTMENT_OTHER): Payer: Medicare Other

## 2018-08-15 ENCOUNTER — Emergency Department (HOSPITAL_COMMUNITY): Payer: Medicare Other

## 2018-08-15 ENCOUNTER — Other Ambulatory Visit: Payer: Self-pay

## 2018-08-15 ENCOUNTER — Emergency Department (HOSPITAL_COMMUNITY)
Admission: EM | Admit: 2018-08-15 | Discharge: 2018-08-15 | Disposition: A | Discharge status: Home / Self Care | Payer: Medicare Other | Attending: Emergency Medicine | Admitting: Emergency Medicine

## 2018-08-15 DIAGNOSIS — M79605 Pain in left leg: Secondary | ICD-10-CM | POA: Insufficient documentation

## 2018-08-15 DIAGNOSIS — I5032 Chronic diastolic (congestive) heart failure: Secondary | ICD-10-CM | POA: Insufficient documentation

## 2018-08-15 DIAGNOSIS — E1122 Type 2 diabetes mellitus with diabetic chronic kidney disease: Secondary | ICD-10-CM | POA: Insufficient documentation

## 2018-08-15 DIAGNOSIS — Z7901 Long term (current) use of anticoagulants: Secondary | ICD-10-CM | POA: Insufficient documentation

## 2018-08-15 DIAGNOSIS — Z856 Personal history of leukemia: Secondary | ICD-10-CM | POA: Diagnosis not present

## 2018-08-15 DIAGNOSIS — R52 Pain, unspecified: Secondary | ICD-10-CM | POA: Diagnosis not present

## 2018-08-15 DIAGNOSIS — R1032 Left lower quadrant pain: Secondary | ICD-10-CM | POA: Diagnosis not present

## 2018-08-15 DIAGNOSIS — N183 Chronic kidney disease, stage 3 (moderate): Secondary | ICD-10-CM | POA: Diagnosis not present

## 2018-08-15 DIAGNOSIS — I130 Hypertensive heart and chronic kidney disease with heart failure and stage 1 through stage 4 chronic kidney disease, or unspecified chronic kidney disease: Secondary | ICD-10-CM | POA: Insufficient documentation

## 2018-08-15 DIAGNOSIS — R1030 Lower abdominal pain, unspecified: Secondary | ICD-10-CM | POA: Diagnosis not present

## 2018-08-15 DIAGNOSIS — R531 Weakness: Secondary | ICD-10-CM | POA: Diagnosis not present

## 2018-08-15 DIAGNOSIS — I2510 Atherosclerotic heart disease of native coronary artery without angina pectoris: Secondary | ICD-10-CM | POA: Insufficient documentation

## 2018-08-15 DIAGNOSIS — Z8673 Personal history of transient ischemic attack (TIA), and cerebral infarction without residual deficits: Secondary | ICD-10-CM | POA: Insufficient documentation

## 2018-08-15 DIAGNOSIS — Z89611 Acquired absence of right leg above knee: Secondary | ICD-10-CM | POA: Diagnosis not present

## 2018-08-15 DIAGNOSIS — I700 Atherosclerosis of aorta: Secondary | ICD-10-CM | POA: Diagnosis not present

## 2018-08-15 DIAGNOSIS — I743 Embolism and thrombosis of arteries of the lower extremities: Secondary | ICD-10-CM | POA: Diagnosis not present

## 2018-08-15 DIAGNOSIS — Z79899 Other long term (current) drug therapy: Secondary | ICD-10-CM | POA: Diagnosis not present

## 2018-08-15 LAB — BASIC METABOLIC PANEL
Anion gap: 9 (ref 5–15)
BUN: 20 mg/dL (ref 8–23)
CO2: 28 mmol/L (ref 22–32)
Calcium: 9.6 mg/dL (ref 8.9–10.3)
Chloride: 99 mmol/L (ref 98–111)
Creatinine, Ser: 1.63 mg/dL — ABNORMAL HIGH (ref 0.61–1.24)
GFR calc Af Amer: 45 mL/min — ABNORMAL LOW (ref >60)
GFR calc non Af Amer: 39 mL/min — ABNORMAL LOW (ref >60)
Glucose, Bld: 116 mg/dL — ABNORMAL HIGH (ref 70–99)
Potassium: 3.9 mmol/L (ref 3.5–5.1)
Sodium: 136 mmol/L (ref 135–145)

## 2018-08-15 LAB — CBC WITH DIFFERENTIAL/PLATELET
Abs Immature Granulocytes: 0.1 10*3/uL — ABNORMAL HIGH (ref 0.00–0.07)
Basophils Absolute: 0.1 10*3/uL (ref 0.0–0.1)
Basophils Relative: 1 %
Eosinophils Absolute: 0.2 10*3/uL (ref 0.0–0.5)
Eosinophils Relative: 2 %
HCT: 36.4 % — ABNORMAL LOW (ref 39.0–52.0)
Hemoglobin: 12.1 g/dL — ABNORMAL LOW (ref 13.0–17.0)
Immature Granulocytes: 1 %
Lymphocytes Relative: 15 %
Lymphs Abs: 1.6 10*3/uL (ref 0.7–4.0)
MCH: 29.7 pg (ref 26.0–34.0)
MCHC: 33.2 g/dL (ref 30.0–36.0)
MCV: 89.2 fL (ref 80.0–100.0)
Monocytes Absolute: 1.5 10*3/uL — ABNORMAL HIGH (ref 0.1–1.0)
Monocytes Relative: 14 %
Neutro Abs: 7.1 10*3/uL (ref 1.7–7.7)
Neutrophils Relative %: 67 %
Platelets: 217 10*3/uL (ref 150–400)
RBC: 4.08 MIL/uL — ABNORMAL LOW (ref 4.22–5.81)
RDW: 12.8 % (ref 11.5–15.5)
WBC: 10.6 10*3/uL — ABNORMAL HIGH (ref 4.0–10.5)
nRBC: 0 % (ref 0.0–0.2)

## 2018-08-15 MED ORDER — IOHEXOL 350 MG/ML SOLN
80.0000 mL | Freq: Once | INTRAVENOUS | Status: AC | PRN
  Administered 2018-08-15: 80 mL via INTRAVENOUS

## 2018-08-15 MED ORDER — MORPHINE SULFATE (PF) 4 MG/ML IV SOLN
4.0000 mg | Freq: Once | INTRAVENOUS | Status: AC
  Administered 2018-08-15: 4 mg via INTRAVENOUS
  Filled 2018-08-15: qty 1

## 2018-08-15 MED ORDER — LORAZEPAM 2 MG/ML IJ SOLN
1.0000 mg | Freq: Once | INTRAMUSCULAR | Status: AC | PRN
  Administered 2018-08-15: 1 mg via INTRAVENOUS
  Filled 2018-08-15: qty 1

## 2018-08-15 MED ORDER — DICLOFENAC SODIUM 1 % TD GEL: 2.0000 g | Freq: Three times a day (TID) | TRANSDERMAL | 0 refills | Status: DC | PRN

## 2018-08-15 MED ORDER — SODIUM CHLORIDE 0.9 % IV BOLUS
500.0000 mL | Freq: Once | INTRAVENOUS | Status: AC
  Administered 2018-08-15: 500 mL via INTRAVENOUS

## 2018-08-15 NOTE — ED Notes (Signed)
Pt waiting for CT.  No needs at this time.

## 2018-08-15 NOTE — ED Triage Notes (Signed)
Pt has seen MD for pain and numbness to Leg/groin.  Also seen MD for numbness to L hand for neck pain.  Numbness to hand and leg x 3 months.   No edema noted.  Pain to medial upper leg.  Drift noted to L leg d/t pain.  No drift to UE.

## 2018-08-15 NOTE — ED Notes (Signed)
Pt very restful and sleepy.  CT called to relate that patient is ready.  No needs noted with patient.  Bolus complete.

## 2018-08-15 NOTE — ED Provider Notes (Signed)
[facility] EMERGENCY DEPARTMENT Provider Note   CSN: [id number] Arrival date & time: 08/15/18  1017    History   Chief Complaint No chief complaint on file.   HPI [name] is a 78 y.o. male.     HPI  78 year old male presents with left groin pain.  The patient states this is been ongoing for a month or a little longer.  He has had associated left leg numbness throughout most of his leg, especially medially.  Some left hand numbness that he thinks has been from chronic neck pain and has been going on for many more months.  However the pain in his groin and weakness/numbness in his leg is new.  He is concerned he may have vascular disease as the cause as this is what led to his previous amputation on the right side.  No leg swelling seen.  Past Medical History:  Diagnosis Date   Bruises easily    d/t being on Eliquis   Carotid artery disease ([place])    a. Duplex 05/2014: 82-80% RICA, 0-34% LICA, elevated velocities in right subclavian artery, normal left subclavian artery..   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 cath in 2003 showed 10-20% LM, scattered 20% prox-mid LAD, 30% more distal LAD, 50-60% stenosis of LAD towards apex, 20% Cx, 30% dRCA, focal 95% stenosis in smaller of 2 branches of PDA, EF 60-65%.   Diabetes mellitus ([place])    Diarrhea    takes Imodium daily as needed   Diverticulosis    Diverticulosis of colon (without mention of hemorrhage)    DJD (degenerative joint disease)    DVT (deep venous thrombosis) ([place]) 07/2011   Esophageal stricture    Essential hypertension    takes Imdur and Lisinopril daily   Gastric polyps    benign   GERD (gastroesophageal reflux disease)    takes Nexium daily   Glaucoma    uses eye  drops   History of bronchitis    not sure when the last time   History of colon polyps    benign   History of kidney stones    History of kidney stones    Insomnia    takes Melatonin nightly as needed   Mixed hyperlipidemia    takes Crestor daily   Myasthenia gravis ([place])    uses prednisone   Osteoporosis 2016   Peripheral edema    takes Lasix daily   Pituitary tumor    checks Dr[facility]Walden at [facility] checks it every Jan    Pneumonia 01/2016   PONV (postoperative nausea and vomiting)    Ptosis    Pulmonary embolus ([place]) 2012   Stroke ([place]) 06/2015    Patient Active Problem List   Diagnosis Date Noted   Neuropathic pain of right ankle 06/27/2018   Insomnia due to medical condition 04/13/2018   Atherosclerosis of native coronary artery of native heart without angina pectoris    Hypokalemia    Stage 3 chronic kidney disease (HCC)    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   Acute blood loss anemia 02/08/2018   Post-operative pain    Unilateral AKA, right ([place])    Ischemic foot 02/05/2018   Ischemia of right lower extremity  Pressure injury of skin 12/27/2017   Arterial occlusive disease    Postoperative pain    CKD (chronic kidney disease), stage III (HCC)    History of CVA (cerebrovascular accident)    Femoral artery thrombosis, 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   Septic arthritis of left sternoclavicular joint ([place]) 11/03/2017   Cervical spinal cord compression ([place]) 10/30/2017   Nasal abscess 09/25/2017   Immunocompromised ([place]) 09/25/2017   Acute on chronic blood loss anemia 08/05/2017   Transient ischemic attack (TIA) 07/23/2017   Subcortical infarction ([place])    Right hemiparesis ([place])    Ataxia 07/21/2017   TIA (transient ischemic attack)    Wheezing 06/17/2017   Bursitis of left elbow 05/01/2017   Dystonia 12/29/2016   PAD (peripheral artery disease) ([place]) 11/18/2016   Osteoporosis 08/28/2016   Paronychia of great toe, left  08/10/2016   Carotid arterial disease ([place]) 07/09/2016   Hypocalcemia 07/09/2016   Diplopia 07/02/2016   S/P lumbar laminectomy 03/11/2016   Radicular pain of left lower extremity 02/21/2016   Cough 01/10/2016   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   Myasthenia gravis ([place])    Diabetes mellitus type 2 in nonobese ([place])    Ataxia, post-stroke    Gait disturbance, post-stroke    Proximal leg weakness    Cervical spondylosis without myelopathy    Achilles tendinitis of right lower extremity    Chronic diastolic CHF (congestive heart failure) (HCC)    Gastroesophageal reflux disease without esophagitis    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   Cerebellar stroke ([place])    Ischemic stroke ([place])    Cerebrovascular accident (CVA) due to thrombosis of left vertebral artery ([place])    History of pulmonary embolus (PE)    Chronic anticoagulation    HLD (hyperlipidemia)    Neck pain 03/19/2015   Pain in the chest    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[place])    Diabetic gastroparesis ([place]) 03/07/2015   Screening for osteoporosis 07/19/2014   Dyspnea 04/09/2014   Edema 04/09/2014   Routine general medical examination at a health care facility 10/23/2013   Bilateral carotid bruits 05/24/2013   Abnormal CT scan, lung 05/24/2013   Encounter for therapeutic drug monitoring 03/14/2013   Lymphadenopathy, inguinal 01/11/2013   AKI (acute kidney injury) ([place]) 10/28/2012   Spinal stenosis of lumbar region 06/01/2012   Myasthenia gravis with exacerbation ([place]) 03/31/2012   Disease of salivary gland 12/09/2011   Left facial pain 10/28/2011   Pulmonary embolism ([place]) 10/25/2011   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   GERD with stricture 09/23/2011   Long term (current) use of anticoagulants 08/03/2011   History of pulmonary embolism 07/30/2011   History of DVT (deep vein  thrombosis) 07/30/2011   Subconjunctival hemorrhage 06/22/2011   Bilateral conjunctivitis 04/09/2011   Diabetes mellitus type 2, controlled ([place]) 12/18/2010   Hearing loss 08/04/2010   Ptosis of eyelid 12/30/2009   Essential hypertension 04/24/2009   HYPERLIPIDEMIA, MIXED 10/05/2008   HEMORRHOIDS-INTERNAL 07/02/2008   HEMORRHOIDS-EXTERNAL 07/02/2008   DIVERTICULOSIS-COLON 07/02/2008   PERSONAL HX COLONIC POLYPS 07/02/2008   RASH-NONVESICULAR 02/07/2008   PNEUMONIA 12/08/2007   Chronic myeloid leukemia in remission ([place]) 07/07/2007   CAD (coronary artery disease) 07/07/2007   NEPHROLITHIASIS, HX OF 07/07/2007    Past Surgical History:  Procedure Laterality Date   ABDOMINAL AORTOGRAM W/LOWER EXTREMITY N/A 11/18/2016   Procedure: ABDOMINAL AORTOGRAM W/LOWER EXTREMITY;  Surgeon: [name], MD;  Location: [facility];  Service: Cardiovascular;  Laterality: N/A;   AMPUTATION Right 02/07/2018   Procedure: right leg AMPUTATION ABOVE KNEE;  Surgeon: [name], MD;  Location: [facility];  Service: Vascular;  Laterality: Right;   AORTOGRAM Right 12/23/2017   Procedure: AORTOGRAM, RIGHT LOWER EXTREMITY ANGIOGRAM, RIGHT SUPERFICIAL FEMORAL ARTERY ANGIOPLASTY WITH STENT, RIGHT POPLITEAL ARTERY ANGIOPLASTY WITH STENT, LEFT GROIN CUTDOWN AND LEFT FEMORAL ARTERY REPAIR;  Surgeon: [name], MD;  Location: [facility];  Service: Vascular;  Laterality: Right;   BACK SURGERY     x2, lumbar and cervical   CARDIAC CATHETERIZATION     CATARACT EXTRACTION Bilateral 12/12   COLONOSCOPY     ESOPHAGOGASTRODUODENOSCOPY ENDOSCOPY  04/2015   IR GENERIC HISTORICAL  03/03/2016   IR IVC FILTER PLMT / S&I [name] GUID/MOD SED 03/03/2016 [name], MD [facility]   IR GENERIC HISTORICAL  04/09/2016   IR RADIOLOGIST EVAL & MGMT 04/09/2016 [name], MD [facility]   IR GENERIC HISTORICAL  04/30/2016   IR IVC FILTER RETRIEVAL / S&I [name] GUID/MOD SED  04/30/2016 [name], MD [facility]   LITHOTRIPSY     LUMBAR LAMINECTOMY/DECOMPRESSION MICRODISCECTOMY Right 09/14/2012   Procedure: Right Lumbar three-four, four-five, Lumbar five-Sacral one decompressive laminectomy;  Surgeon: [name], MD;  Location: [facility];  Service: Neurosurgery;  Laterality: Right;   LUMBAR LAMINECTOMY/DECOMPRESSION MICRODISCECTOMY Left 03/11/2016   Procedure: Left Lumbar Two-ThreeMicrodiscectomy;  Surgeon: [name], MD;  Location: [facility];  Service: Neurosurgery;  Laterality: Left;   PERIPHERAL VASCULAR INTERVENTION  11/18/2016   Procedure: PERIPHERAL VASCULAR INTERVENTION;  Surgeon: [name], MD;  Location: [facility];  Service: Cardiovascular;;  Left popliteal   SHOULDER SURGERY Left 05/07/2009   TONSILLECTOMY          Home Medications    Prior to Admission medications   Medication Sig Start Date End Date Taking? Authorizing Provider  acetaminophen (TYLENOL) 325 MG tablet Take 1-2 tablets (325-650 mg total) by mouth every 4 (four) hours as needed for mild pain. 02/16/18   Love, Ivan Anchors, PA-C  brimonidine-timolol (COMBIGAN) 0.2-0.5 % ophthalmic solution Place 1 drop into both eyes 2 (two) times daily.     [provider]  cholecalciferol (VITAMIN D) 1000 units tablet Take 1,000 Units by mouth daily.     [provider]  diphenoxylate-atropine (LOMOTIL) 2.5-0.025 MG tablet Take 1 tablet by mouth every 6 (six) hours as needed for diarrhea or loose stools. 05/19/18   Armbruster, Carlota Raspberry, MD  ELIQUIS 5 MG TABS tablet TAKE 1 TABLET (5 MG TOTAL) BY MOUTH 2 (TWO) TIMES DAILY. 12/09/15   Kathrynn Ducking, MD  furosemide (LASIX) 40 MG tablet Take 0.5 tablets (20 mg total) by mouth daily. 02/18/18 02/13/19  Love, Ivan Anchors, PA-C  gabapentin (NEURONTIN) 100 MG capsule Take one at night for one week and then take one twice daily. 06/27/18   Libby Maw, MD  imatinib (GLEEVEC) 100 MG tablet Take 300 mg by mouth daily  with lunch. Take with noon meal and a large glass of water.Caution:Chemotherapy    [provider]  isosorbide mononitrate (IMDUR) 30 MG 24 hr tablet Take 1 tablet (30 mg total) by mouth daily. 09/17/15   Sherren Mocha, MD  lidocaine (LIDODERM) 5 % Place 1 patch onto the skin daily as needed (for back pain). Remove & Discard patch within 12 hours or as directed by MD     [provider]  loperamide (IMODIUM) 2 MG  capsule Take by mouth as needed for diarrhea or loose stools.    [provider]  Multiple Vitamin (MULTIVITAMIN) tablet Take 1 tablet by mouth daily.    [provider]  potassium chloride (K-DUR) 10 MEQ tablet Take 2 tablets (20 mEq total) by mouth daily. 02/18/18 02/13/19  Love, Ivan Anchors, PA-C  predniSONE (DELTASONE) 1 MG tablet TAKE 3 TABLETS BY MOUTH ONCE DAILY WITH BREAKFAST 05/10/18   Kathrynn Ducking, MD  pyridostigmine (MESTINON) 60 MG tablet Take 60 mg by mouth daily.    [provider]  rosuvastatin (CRESTOR) 10 MG tablet Take 5 mg by mouth daily.    [provider]  tamsulosin (FLOMAX) 0.4 MG CAPS capsule Take 0.4 mg by mouth daily after supper.    [provider]  traZODone (DESYREL) 100 MG tablet Take 1 tablet (100 mg total) by mouth at bedtime. 06/24/18   Bayard Hugger, NP    Family History Family History  Problem Relation Age of Onset   Heart disease Mother    Heart attack Mother    Stroke Mother    Heart disease Father    Heart attack Father    Stroke Father    Breast cancer Sister        Twin    Heart attack Sister    Dementia Sister    Heart disease Sister    Heart attack Sister    Clotting disorder Sister    Heart attack Sister    Colon cancer Neg Hx     Social History Social History   Tobacco Use   Smoking status: Never Smoker   Smokeless tobacco: Never Used  Substance Use Topics   Alcohol use: No    Alcohol/week: 0.0 standard drinks   Drug use: No     Allergies    Other, Phenergan [promethazine hcl], Zofran [ondansetron hcl], and Sulfamethoxazole   Review of Systems Review of Systems  Respiratory: Negative for shortness of breath.   Cardiovascular: Negative for chest pain.  Gastrointestinal: Negative for abdominal pain.  Musculoskeletal: Positive for myalgias.  Neurological: Positive for weakness and numbness.  All other systems reviewed and are negative.    Physical Exam Updated Vital Signs BP (!) 122/51    Pulse 65    Temp 97.9 F (36.6 C) (Oral)    Resp 12    Ht 5\' 6"  (1.676 m)    Wt 65.8 kg    SpO2 100%    BMI 23.40 kg/m   Physical Exam Vitals signs and nursing note reviewed.  Constitutional:      Appearance: He is well-developed.  HENT:     Head: Normocephalic and atraumatic.     Right Ear: External ear normal.     Left Ear: External ear normal.     Nose: Nose normal.  Eyes:     General:        Right eye: No discharge.        Left eye: No discharge.  Neck:     Musculoskeletal: Neck supple.  Cardiovascular:     Rate and Rhythm: Normal rate and regular rhythm.     Pulses:          Dorsalis pedis pulses are detected w/ Doppler on the left side.       Posterior tibial pulses are detected w/ Doppler on the left side.  Pulmonary:     Effort: Pulmonary effort is normal.     Breath sounds: Normal breath sounds.  Abdominal:  Palpations: Abdomen is soft.     Tenderness: There is no abdominal tenderness.  Musculoskeletal:     Left hip: He exhibits no tenderness.     Comments: Normal warmth in LLE. Unable to feel pulses but can get with doppler. Limited ability to lift leg off stretcher on his own. No swelling appreciated. Right AKA  Skin:    General: Skin is warm and dry.  Neurological:     Mental Status: He is alert.  Psychiatric:        Mood and Affect: Mood is not anxious.      ED Treatments / Results  Labs (all labs ordered are listed, but only abnormal results are displayed) Labs Reviewed  BASIC METABOLIC  PANEL - Abnormal; Notable for the following components:      Result Value   Glucose, Bld 116 (*)    Creatinine, Ser 1.63 (*)    GFR calc non Af Amer 39 (*)    GFR calc Af Amer 45 (*)    All other components within normal limits  CBC WITH DIFFERENTIAL/PLATELET - Abnormal; Notable for the following components:   WBC 10.6 (*)    RBC 4.08 (*)    Hemoglobin 12.1 (*)    HCT 36.4 (*)    Monocytes Absolute 1.5 (*)    Abs Immature Granulocytes 0.10 (*)    All other components within normal limits    EKG EKG Interpretation  Date/Time:  Monday August 15 2018 10:49:59 EDT Ventricular Rate:  70 PR Interval:    QRS Duration: 90 QT Interval:  417 QTC Calculation: 450 R Axis:   -39 Text Interpretation:  Sinus or ectopic atrial rhythm Inferior infarct, old afib no longer present compared to 2019 Confirmed by Sherwood Gambler [phone]) on 08/15/2018 11:59:10 AM   Radiology No results found.  Procedures Procedures (including critical care time)  Medications Ordered in ED Medications  LORazepam (ATIVAN) injection 1 mg (1 mg Intravenous Given 08/15/18 1139)  sodium chloride 0.9 % bolus 500 mL (0 mLs Intravenous Stopped 08/15/18 1203)  morphine 4 MG/ML injection 4 mg (4 mg Intravenous Given 08/15/18 1322)     Initial Impression / Assessment and Plan / ED Course  I have reviewed the triage vital signs and the nursing notes.  Pertinent labs & imaging results that were available during my care of the patient were reviewed by me and considered in my medical decision making (see chart for details).        Given his significant vascular history, CT angiography of his aorta and leg were obtained.  This is currently pending.  I think DVT is probably less likely though could be considered if the angiography is negative. Care to Dr. Ralene Bathe.  Final Clinical Impressions(s) / ED Diagnoses   Final diagnoses:  None    ED Discharge Orders    None       Sherwood Gambler, MD 08/15/18 1523

## 2018-08-15 NOTE — ED Provider Notes (Signed)
Patient care assumed at 1500.  Pt w/ hx/o PAD here for evaluation of 1.5 months of progressive left groin pain.  CTA pending to r/o occlusion.    CTA neg for occlusion.  Vascular US neg for DVT.  No evidence of cellulitis or infection.  D/w pt unclear source of sxs.  Recommend rest.  Will rx voltaren gel. Discussed outpatient follow up and return precautions.     Quintella Reichert, MD 08/15/18 [phone]

## 2018-08-15 NOTE — ED Notes (Signed)
Returns from CT scan

## 2018-08-15 NOTE — Progress Notes (Signed)
VASCULAR LAB PRELIMINARY  PRELIMINARY  PRELIMINARY  PRELIMINARY  Left lower extremity venous duplex completed.    Preliminary report:  See CV proc for preliminary results.   Gave Dr. Ralene Bathe results.   Taysom Glymph, RVT 08/15/2018, 7:02 PM    3

## 2018-08-15 NOTE — ED Notes (Signed)
Pt quietly resting without needs noted at this time.

## 2018-08-15 NOTE — ED Notes (Signed)
Pt c/o new onset increasing pain to upper L leg   Pain meds ordered.

## 2018-08-16 ENCOUNTER — Ambulatory Visit: Payer: Medicare Other | Admitting: Family Medicine

## 2018-08-16 ENCOUNTER — Telehealth: Payer: Self-pay | Admitting: Physical Therapy

## 2018-08-16 ENCOUNTER — Ambulatory Visit: Payer: Medicare Other | Admitting: Physical Therapy

## 2018-08-16 NOTE — Telephone Encounter (Signed)
Mr. Londo returned PT call from earlier this morning. He saw Dr. Gladstone Lighter last week. X-ray was clear and he was ordering CT scan. Pain got so bad that he went to ED on 08/15/2018. They did doppler & CT scan and found no fractures nor clots. ED doctor feels that it is muscular in nature and recommended PT working on strength & flexibility without prosthesis for a few sessions. Plan for next visit is to work on left hip muscle strength & pain. PT adding manual therapy, ultrasound, E-stim, heat & ice to plan of care. Jamey Reas, PT, DPT PT Specializing in [place] 08/16/2018@ 10:03 AM Phone:  [phone]  Fax:  [phone] [facility] [address] [place], [place] [address]

## 2018-08-17 ENCOUNTER — Other Ambulatory Visit: Payer: Self-pay | Admitting: Orthopedic Surgery

## 2018-08-17 DIAGNOSIS — M25552 Pain in left hip: Secondary | ICD-10-CM

## 2018-08-18 ENCOUNTER — Other Ambulatory Visit: Payer: Self-pay | Admitting: Neurology

## 2018-08-18 ENCOUNTER — Other Ambulatory Visit: Payer: Self-pay

## 2018-08-18 ENCOUNTER — Telehealth: Payer: Self-pay | Admitting: Neurology

## 2018-08-18 ENCOUNTER — Ambulatory Visit: Payer: Medicare Other | Attending: Neurology | Admitting: Physical Therapy

## 2018-08-18 ENCOUNTER — Encounter: Payer: Self-pay | Admitting: Physical Therapy

## 2018-08-18 DIAGNOSIS — R2689 Other abnormalities of gait and mobility: Secondary | ICD-10-CM | POA: Diagnosis not present

## 2018-08-18 DIAGNOSIS — M542 Cervicalgia: Secondary | ICD-10-CM | POA: Insufficient documentation

## 2018-08-18 DIAGNOSIS — R2681 Unsteadiness on feet: Secondary | ICD-10-CM | POA: Insufficient documentation

## 2018-08-18 DIAGNOSIS — M6281 Muscle weakness (generalized): Secondary | ICD-10-CM | POA: Diagnosis not present

## 2018-08-18 DIAGNOSIS — G8929 Other chronic pain: Secondary | ICD-10-CM | POA: Insufficient documentation

## 2018-08-18 DIAGNOSIS — M545 Low back pain: Secondary | ICD-10-CM | POA: Insufficient documentation

## 2018-08-18 DIAGNOSIS — R293 Abnormal posture: Secondary | ICD-10-CM

## 2018-08-18 DIAGNOSIS — M25552 Pain in left hip: Secondary | ICD-10-CM | POA: Diagnosis not present

## 2018-08-18 DIAGNOSIS — Z9181 History of falling: Secondary | ICD-10-CM | POA: Diagnosis not present

## 2018-08-18 MED ORDER — PREDNISONE 1 MG PO TABS: ORAL_TABLET | ORAL | 0 refills | Status: DC

## 2018-08-18 NOTE — Addendum Note (Signed)
Addended by: Isaias Cowman on: 08/18/2018 11:19 PM   Modules accepted: Orders

## 2018-08-18 NOTE — Therapy (Addendum)
[facility] [address] [place], [name], [address] Phone: [phone]   Fax:  [phone]  Physical Therapy Treatment  Patient Details  Name: [name] MRN: [id number] Date of Birth: May 16, 1940 Referring Provider (PT): Kathrynn Ducking, MD   Encounter Date: 08/18/2018   CLINIC OPERATION CHANGES: [facility] is open at lower capacity following universal masking, social distancing, and patient screening.   PT End of Session - 08/18/18 1112    Visit Number  15    Number of Visits  20    Date for PT Re-Evaluation  08/26/18    Authorization Type  Medicare & [employer]    PT Start Time  1101    PT Stop Time  1150    PT Time Calculation (min)  49 min    Equipment Utilized During Treatment  Gait belt    Activity Tolerance  Patient tolerated treatment well    Behavior During Therapy  WFL for tasks assessed/performed       Past Medical History:  Diagnosis Date  . Bruises easily    d/t being on Eliquis  . Carotid artery disease ([place])    a. Duplex 05/2014: 82-80% RICA, 0-34% LICA, elevated velocities in right subclavian artery, normal left subclavian artery..  . Chronic back pain    HNP  . Chronic kidney disease (CKD)   . Claustrophobia    takes Ativan as needed  . 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 Coronary atherosclerosis of unspecified type of vessel, native or graft    a. cath in 2003 showed 10-20% LM, scattered 20% prox-mid LAD, 30% more distal LAD, 50-60% stenosis of LAD towards apex, 20% Cx, 30% dRCA, focal 95% stenosis in smaller of 2 branches of PDA, EF 60-65%.  . Diabetes mellitus ([place])   . Diarrhea    takes Imodium daily as needed  . Diverticulosis   . Diverticulosis of colon (without mention of hemorrhage)   . DJD (degenerative joint disease)   . DVT (deep venous thrombosis) ([place]) 07/2011  . Esophageal stricture   . Essential hypertension    takes Imdur and  Lisinopril daily  . Gastric polyps    benign  . GERD (gastroesophageal reflux disease)    takes Nexium daily  . Glaucoma    uses eye drops  . History of bronchitis    not sure when the last time  . History of colon polyps    benign  . History of kidney stones   . History of kidney stones   . Insomnia    takes Melatonin nightly as needed  . Mixed hyperlipidemia    takes Crestor daily  . Myasthenia gravis ([place])    uses prednisone  . Osteoporosis 2016  . Peripheral edema    takes Lasix daily  . Pituitary tumor    [facility]den [facility] checks it every Jan   . Pneumonia 01/2016  . PONV (postoperative nausea and vomiting)   . Ptosis   . Pulmonary embolus ([place]) 2012  . Stroke [facility]) 06/2015    Past Surgical History:  Procedure Laterality Date  . ABDOMINAL AORTOGRAM W/LOWER EXTREMITY N/A 11/18/2016   Procedure: ABDOMINAL AORTOGRAM W/LOWER EXTREMITY;  Surgeon: [name], MD;  Location: [facility];  Service: Cardiovascular;  Laterality: N/A;  . AMPUTATION Right 02/07/2018   Procedure: right leg AMPUTATION ABOVE KNEE;  Surgeon: [name], MD;  Location: [facility];  Service: Vascular;  Laterality: Right;  . AORTOGRAM Right 12/23/2017   Procedure: AORTOGRAM, RIGHT LOWER EXTREMITY ANGIOGRAM, RIGHT SUPERFICIAL FEMORAL ARTERY ANGIOPLASTY WITH STENT, RIGHT POPLITEAL ARTERY ANGIOPLASTY WITH STENT, LEFT GROIN CUTDOWN AND LEFT FEMORAL ARTERY REPAIR;  Surgeon: [name], MD;  Location: [facility];  Service: Vascular;  Laterality: Right;  . BACK SURGERY     x2, lumbar and cervical  . CARDIAC CATHETERIZATION    . CATARACT EXTRACTION Bilateral 12/12  . COLONOSCOPY    . ESOPHAGOGASTRODUODENOSCOPY ENDOSCOPY  04/2015  . IR GENERIC HISTORICAL  03/03/2016   IR IVC FILTER PLMT / S&I [name] GUID/MOD SED 03/03/2016 [name], MD [facility]  . IR GENERIC HISTORICAL  04/09/2016   IR RADIOLOGIST EVAL & MGMT 04/09/2016 [name], MD [facility]  . IR GENERIC  HISTORICAL  04/30/2016   IR IVC FILTER RETRIEVAL / S&I [name] GUID/MOD SED 04/30/2016 [name], MD [facility]  . LITHOTRIPSY    . LUMBAR LAMINECTOMY/DECOMPRESSION MICRODISCECTOMY Right 09/14/2012   Procedure: Right Lumbar three-four, four-five, Lumbar five-Sacral one decompressive laminectomy;  Surgeon: [name], MD;  Location: [facility];  Service: Neurosurgery;  Laterality: Right;  . LUMBAR LAMINECTOMY/DECOMPRESSION MICRODISCECTOMY Left 03/11/2016   Procedure: Left Lumbar Two-ThreeMicrodiscectomy;  Surgeon: [name], MD;  Location: [facility];  Service: Neurosurgery;  Laterality: Left;  . PERIPHERAL VASCULAR INTERVENTION  11/18/2016   Procedure: PERIPHERAL VASCULAR INTERVENTION;  Surgeon: [name], MD;  Location: [facility];  Service: Cardiovascular;;  Left popliteal  . SHOULDER SURGERY Left 05/07/2009  . TONSILLECTOMY      There were no vitals filed for this visit.  Subjective Assessment - 08/18/18 1108    Subjective  Has not worn the prosthesis since being seen in the ED for hip/groin area. Had CT and dopplar studies done with no findings. Has seen ortho as well who gave him a gel (can't recall the name, thinks Voltarin) for pain. It's helping some. Follows up with Gioffre on Monday, 08/22/2018. Will be starting back on his chemotherapy next week as well, see's the doctor on Wed for this (they may change the drug he goes on).    Pertinent History   Back surgery X 4; Tonsillectomy; Cataract extraction (Bilateral, 12/12); Shoulder surgery (Left, 05/07/2009); Cardiac catheterization; Lumbar laminectomy/decompression microdiscectomy (Right, 09/14/2012); Esophagogastroduodenoscopy endoscopy (04/2015); ir generic historical (03/03/2016); Colonoscopy; Lithotripsy; Lumbar laminectomy/decompression microdiscectomy (Left, 03/11/2016); ir generic historical (04/09/2016); ir generic historical (04/30/2016); ABDOMINAL AORTOGRAM W/LOWER EXTREMITY (N/A, 11/18/2016); PERIPHERAL VASCULAR  INTERVENTION (11/18/2016); Aortogram (Right, 12/23/2017); and Transfemoral Amputation (Right, 02/07/2018).    Patient Stated Goals  He wants to walk with prosthesis, get around house & back in community. Try golf again.     Currently in Pain?  Yes    Pain Score  4     Pain Location  Leg   groin down to knee   Pain Orientation  Right    Pain Descriptors / Indicators  Aching    Pain Type  Acute pain;Chronic pain    Pain Onset  1 to 4 weeks ago    Pain Frequency  Constant    Aggravating Factors   moving left leg, lifting left leg up (hip flexion) and at night    Pain Relieving Factors  Medicated gel (voltarin), Tramadol           [facility] Adult PT Treatment/Exercise - 08/18/18 1129      Transfers   Transfers  Squat Pivot Transfers    Squat Pivot Transfers  4: Min  guard    Squat Pivot Transfer Details (indicate cue type and reason)  from transport chair to/from mat table      Exercises   Exercises  Other Exercises    Other Exercises   passive stretching of left hamstring, left hip adductors for 30 sec holds for 4 reps each with pt in supine on mat table. pt presents with tightness in both muscle groups with decr range with stretching. .      Modalities   Modalities  Electrical Stimulation;Moist Heat      Moist Heat Therapy   Number Minutes Moist Heat  10 Minutes   concurrent with e-stim   Moist Heat Location  Other (comment)   left inner thigh/groin area     Electrical Stimulation   Electrical Stimulation Location  left inner thigh close to groin    Electrical Stimulation Action  IFC for decreased pain and muscle tighness concurrent with moist hot pack    Electrical Stimulation Parameters  intensity to tolerance for 10 mintues    Electrical Stimulation Goals  Pain      Manual Therapy   Manual Therapy  Soft tissue mobilization;Other (comment)    Manual therapy comments  all manual therapy was for decreased pain and muslce tightness    Soft tissue mobilization  to inner thigh  of left leg (adductors) with most tenderness at orgin near pelvis.     Other Manual Therapy  strain counter strain method was used to stretch muscles of inner thigh due to tightness and tenderness                PT Short Term Goals - 07/26/18 1134      PT SHORT TERM GOAL #1   Title  Patient demonstrates proper donning including tightening suspension strap safely without assistance or cues. (All STGs Target Date: 07/29/2018)    Baseline  07/26/18: pt continues to need cues to keep weight on prosthesis and not "bounce" on the prosthesis to sink in prior to tightening the strap.    Time  --    Period  --    Status  Partially Met    Target Date  --      PT SHORT TERM GOAL #2   Title  Patient tolerates wear of prosthesis >8hrs total /day without skin or limb pain issues.     Baseline  07/26/18: pt able to wear liner for this amount of time, only wearing prosthesis for up to 4 hours a day right now    Status  Partially Met      PT SHORT TERM GOAL #3   Title  Standing balance with RW reaches 7" and scans environment with head & trunk movements safely with no loss of balance.     Baseline  07/26/18: met today with RW support and min guard assist for safety. trunk rotation limited by back/hip pain    Status  Achieved    Target Date  07/29/18      PT SHORT TERM GOAL #4   Title  Patient ambulates 150' with RW & prosthesis with supervision.     Baseline  07/26/18: 115 feet today before needing to rest due to hip/back pain, min guard to min assist    Status  Partially Met    Target Date  07/29/18      PT SHORT TERM GOAL #5   Title  Patient negotiates stairs with 2 rails, ramps & curbs with RW & prosthesis with minA.  Baseline  07/26/18: met with RW/prosthesis/rails today    Time  --    Period  --    Status  Achieved    Target Date  07/29/18        PT Long Term Goals - 07/27/18 0855      PT LONG TERM GOAL #1   Title  Patient & wife verbalize & demonstrate understanding of proper  prosthetic care to enable safe use of prosthesis. (All LTGs Target Date: 08/26/2018)    Time  12    Period  Weeks    Status  On-going    Target Date  08/26/18      PT LONG TERM GOAL #2   Title  Patient tolerates wearing prosthesis >80% of awake hours without skin or limb pain issues to enable function throughout his day.    Time  12    Period  Weeks    Status  Revised    Target Date  08/26/18      PT LONG TERM GOAL #3   Title  Patient standing balance with RW support modified independent: reaching 10" anteriorly, pick up item from floor & scans environment.     Time  12    Period  Weeks    Status  On-going    Target Date  08/26/18      PT LONG TERM GOAL #4   Title  Patient ambulates 150' with RW & prosthesis with supervision to enable community mobility with family.    Time  12    Period  Weeks    Status  On-going    Target Date  08/26/18      PT LONG TERM GOAL #5   Title  Patient negotiates ramps & curbs with RW & prosthesis with supervision to enable community access with family.    Time  12    Period  Weeks    Status  Revised    Target Date  08/26/18      PT LONG TERM GOAL #6   Title  Patient ambulates 4' indoors around furniture with RW & prosthesis modified independent to enable household mobility.    Time  1    Period  Months    Status  New    Target Date  08/26/18            Plan - 08/18/18 1112    Clinical Impression Statement  PTA had discussed pt's case with primary PT prior to todays visit, including all imaging results and ortho MD rec's to address left LE weakness and muscular pain. Primary PT plan to add modalities used today to plan of care and had wrote to begin using them today. Trialed use of IFC estim with moist heat, manual therapy and gentle stretching today with slight incr in pain with stretching reported. Will continue to monitor pt's response to new treatment interventions and adjust as needed.    Personal Factors and Comorbidities   Age;Comorbidity 3+;Fitness;Past/Current Experience    Comorbidities  Back surgery x 4; Tonsillectomy; Cataract extraction (Bilateral, 12/12); Shoulder surgery (Left, 05/07/2009); Cardiac catheterization; Lumbar laminectomy/decompression microdiscectomy (Right, 09/14/2012); Esophagogastroduodenoscopy endoscopy (04/2015); ir generic historical (03/03/2016); Colonoscopy; Lithotripsy; Lumbar laminectomy/decompression microdiscectomy (Left, 03/11/2016); ir generic historical (04/09/2016); ir generic historical (04/30/2016); ABDOMINAL AORTOGRAM W/LOWER EXTREMITY (N/A, 11/18/2016); PERIPHERAL VASCULAR INTERVENTION (11/18/2016); Aortogram (Right, 12/23/2017); and Amputation Transfemoral (Right, 02/07/2018).    Examination-Activity Limitations  Hygiene/Grooming;Lift;Locomotion Level;Reach Overhead;Stairs;Stand;Toileting;Transfers    Examination-Participation Restrictions  Church;Community Activity;Driving    Stability/Clinical Decision Making  Evolving/Moderate complexity  Rehab Potential  Good    PT Frequency  2x / week    PT Duration  12 weeks    PT Treatment/Interventions  ADLs/Self Care Home Management;Stair training;Gait training;DME Instruction;Functional mobility training;Therapeutic activities;Therapeutic exercise;Balance training;Neuromuscular re-education;Patient/family education;Prosthetic Training;Manual techniques;Vestibular    PT Next Visit Plan  continue to address hip/groin pain, pt on hold from using prosthesis by ortho MD at this time. see's Dr. Rushie Nyhan on 08/22/2018 for follow up appointment    Consulted and Agree with Plan of Care  Patient;Family member/caregiver    Family Member Consulted  wife, Joseluis Alessio       Patient will benefit from skilled therapeutic intervention in order to improve the following deficits and impairments:  Abnormal gait, Decreased activity tolerance, Decreased balance, Decreased endurance, Decreased knowledge of use of DME, Decreased mobility, Decreased range of  motion, Decreased strength, Impaired flexibility, Prosthetic Dependency, Postural dysfunction  Visit Diagnosis: 1. Muscle weakness (generalized)   2. Abnormal posture   3. Other abnormalities of gait and mobility        Problem List Patient Active Problem List   Diagnosis Date Noted  . Neuropathic pain of right ankle 06/27/2018  . Insomnia due to medical condition 04/13/2018  . Atherosclerosis of native coronary artery of native heart without angina pectoris   . Hypokalemia   . Stage 3 chronic kidney disease ([place])   . 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 Acute blood loss anemia 02/08/2018  . Post-operative pain   . Unilateral AKA, right ([place])   . Ischemic foot 02/05/2018  . Ischemia of right lower extremity   . Pressure injury of skin 12/27/2017  . Arterial occlusive disease   . Postoperative pain   . CKD (chronic kidney disease), stage III ([place])   . History of CVA (cerebrovascular accident)   . Femoral artery thrombosis, right ([place]) 12/23/2017  . Septic arthritis of left sternoclavicular joint ([place]) 11/03/2017  . Cervical spinal cord compression ([place]) 10/30/2017  . Nasal abscess 09/25/2017  . Immunocompromised ([place]) 09/25/2017  . Acute on chronic blood loss anemia 08/05/2017  . Transient ischemic attack (TIA) 07/23/2017  . Subcortical infarction ([place])   . Right hemiparesis ([place])   . Ataxia 07/21/2017  . TIA (transient ischemic attack)   . Wheezing 06/17/2017  . Bursitis of left elbow 05/01/2017  . Dystonia 12/29/2016  . PAD (peripheral artery disease) ([place]) 11/18/2016  . Osteoporosis 08/28/2016  . Paronychia of great toe, left 08/10/2016  . Carotid arterial disease ([place]) 07/09/2016  . Hypocalcemia 07/09/2016  . Diplopia 07/02/2016  . S/P lumbar laminectomy 03/11/2016  . Radicular pain of left lower extremity 02/21/2016  . Cough 01/10/2016  . 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 Myasthenia gravis ([place])   . Diabetes mellitus type 2 in  nonobese ([facility])   . Ataxia, post-stroke   . Gait disturbance, post-stroke   . Proximal leg weakness   . Cervical spondylosis without myelopathy   . Achilles tendinitis of right lower extremity   . Chronic diastolic CHF (congestive heart failure) ([place])   . Gastroesophageal reflux disease without esophagitis   . 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 Cerebellar stroke ([place])   . Ischemic stroke ([place])   . Cerebrovascular accident (CVA) due to thrombosis of left vertebral artery ([place])   . History of pulmonary embolus ([facility])   . Chronic anticoagulation   . HLD (hyperlipidemia)   . Neck pain 03/19/2015  . Pain in the chest   .  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[place])   . Diabetic gastroparesis ([place]) 03/07/2015  . Screening for osteoporosis 07/19/2014  . Dyspnea 04/09/2014  . Edema 04/09/2014  . Routine general medical examination at a health care facility 10/23/2013  . Bilateral carotid bruits 05/24/2013  . Abnormal CT scan, lung 05/24/2013  . Encounter for therapeutic drug monitoring 03/14/2013  . Lymphadenopathy, inguinal 01/11/2013  . AKI (acute kidney injury) ([place]) 10/28/2012  . 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 Myasthenia gravis with exacerbation ([place]) 03/31/2012  . Disease of salivary gland 12/09/2011  . Left facial pain 10/28/2011  . Pulmonary embolism ([place]) 10/25/2011  . 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 GERD with stricture 09/23/2011  . Long term (current) use of anticoagulants 08/03/2011  . History of pulmonary embolism 07/30/2011  . History of DVT (deep vein thrombosis) 07/30/2011  . Subconjunctival hemorrhage 06/22/2011  . Bilateral conjunctivitis 04/09/2011  . Diabetes mellitus type 2, controlled ([place]) 12/18/2010  . Hearing loss 08/04/2010  . Ptosis of eyelid 12/30/2009  . Essential hypertension 04/24/2009  . HYPERLIPIDEMIA, MIXED 10/05/2008  . HEMORRHOIDS-INTERNAL 07/02/2008  .  HEMORRHOIDS-EXTERNAL 07/02/2008  . DIVERTICULOSIS-COLON 07/02/2008  . PERSONAL HX COLONIC POLYPS 07/02/2008  . RASH-NONVESICULAR 02/07/2008  . PNEUMONIA 12/08/2007  . Chronic myeloid leukemia in remission ([place]) 07/07/2007  . CAD (coronary artery disease) 07/07/2007  . NEPHROLITHIASIS, HX OF 07/07/2007   Willow Ora, PTA, [facility] [address], [place], [place] [address] [phone] 08/18/18, 10:07 PM   Name: [name] MRN: [id number] Date of Birth: May 14, 1940  Plan - 08/18/18 2313    Clinical Impression Statement  Based on ED visit & MD visit with Dr. Gladstone Lighter PT adding modalities & manual therapy to plan of care to work on left groin pain. CT at ED appointment ruled out fractures & vascular clots.    Personal Factors and Comorbidities  Age;Comorbidity 3+;Fitness;Past/Current Experience    Comorbidities  Back surgery x 4; Tonsillectomy; Cataract extraction (Bilateral, 12/12); Shoulder surgery (Left, 05/07/2009); Cardiac catheterization; Lumbar laminectomy/decompression microdiscectomy (Right, 09/14/2012); Esophagogastroduodenoscopy endoscopy (04/2015); ir generic historical (03/03/2016); Colonoscopy; Lithotripsy; Lumbar laminectomy/decompression microdiscectomy (Left, 03/11/2016); ir generic historical (04/09/2016); ir generic historical (04/30/2016); ABDOMINAL AORTOGRAM W/LOWER EXTREMITY (N/A, 11/18/2016); PERIPHERAL VASCULAR INTERVENTION (11/18/2016); Aortogram (Right, 12/23/2017); and Amputation Transfemoral (Right, 02/07/2018).    Examination-Activity Limitations  Hygiene/Grooming;Lift;Locomotion Level;Reach Overhead;Stairs;Stand;Toileting;Transfers    Examination-Participation Restrictions  Church;Community Activity;Driving    Stability/Clinical Decision Making  Evolving/Moderate complexity    Rehab Potential  Good    PT Frequency  2x / week    PT Duration  12 weeks    PT Treatment/Interventions  ADLs/Self Care Home Management;Stair training;Gait  training;DME Instruction;Functional mobility training;Therapeutic activities;Therapeutic exercise;Balance training;Neuromuscular re-education;Patient/family education;Prosthetic Training;Manual techniques;Vestibular;Cryotherapy;Electrical Stimulation;Moist Heat;Ultrasound    PT Next Visit Plan  Get LTGs next week & recertify after discussion with pt & wife, continue to address hip/groin pain, pt on hold from using prosthesis by ortho MD at this time. see's Dr. Rushie Nyhan on 08/22/2018 for follow up appointment    Consulted and Agree with Plan of Care  Patient;Family member/caregiver    Family Member Consulted  wife, Izaias Krupka      Jamey Reas, PT, DPT PT Specializing in [place] 08/18/18 11:16 PM Phone:  [phone]  Fax:  [phone] [facility] [address] [place], [name] [address]

## 2018-08-18 NOTE — Telephone Encounter (Signed)
I reached out to the pt. Pt has not been seen in our office since 2018. Pt was advised he would need to make an appt before refill would be sent.  Pt scheduled fro 09/13/18 at 130 check in time of 1 pm. Rx for a 30 day supply has been sent pending pt's next appointment.

## 2018-08-18 NOTE — Telephone Encounter (Signed)
Pt is needing a refill on his predniSONE (DELTASONE) 1 MG tablet sent to [facility] in [place]. Pt only has enough for 2 days now and he states that the pharmacy has sent in the refill request 3 times. Please advise.

## 2018-08-22 ENCOUNTER — Encounter: Payer: Self-pay | Admitting: Physical Therapy

## 2018-08-22 ENCOUNTER — Ambulatory Visit: Payer: Medicare Other | Admitting: Physical Therapy

## 2018-08-22 ENCOUNTER — Other Ambulatory Visit: Payer: Self-pay

## 2018-08-22 DIAGNOSIS — M6281 Muscle weakness (generalized): Secondary | ICD-10-CM

## 2018-08-22 DIAGNOSIS — R293 Abnormal posture: Secondary | ICD-10-CM

## 2018-08-22 DIAGNOSIS — M545 Low back pain: Secondary | ICD-10-CM | POA: Diagnosis not present

## 2018-08-22 DIAGNOSIS — G8929 Other chronic pain: Secondary | ICD-10-CM | POA: Diagnosis not present

## 2018-08-22 DIAGNOSIS — R2681 Unsteadiness on feet: Secondary | ICD-10-CM | POA: Diagnosis not present

## 2018-08-22 DIAGNOSIS — R2689 Other abnormalities of gait and mobility: Secondary | ICD-10-CM

## 2018-08-22 DIAGNOSIS — M25552 Pain in left hip: Secondary | ICD-10-CM | POA: Diagnosis not present
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
## 2018-08-23 ENCOUNTER — Encounter: Payer: Medicare Other | Admitting: Physical Therapy

## 2018-08-24 ENCOUNTER — Encounter: Payer: Self-pay | Admitting: Physical Therapy

## 2018-08-24 ENCOUNTER — Ambulatory Visit: Payer: Medicare Other | Admitting: Physical Therapy

## 2018-08-24 ENCOUNTER — Other Ambulatory Visit: Payer: Self-pay

## 2018-08-24 DIAGNOSIS — M545 Low back pain: Secondary | ICD-10-CM | POA: Diagnosis not present

## 2018-08-24 DIAGNOSIS — M25552 Pain in left hip: Secondary | ICD-10-CM

## 2018-08-24 DIAGNOSIS — M6281 Muscle weakness (generalized): Secondary | ICD-10-CM | POA: Diagnosis not present

## 2018-08-24 DIAGNOSIS — G8929 Other chronic pain: Secondary | ICD-10-CM

## 2018-08-24 DIAGNOSIS — R2689 Other abnormalities of gait and mobility: Secondary | ICD-10-CM | POA: Diagnosis not present

## 2018-08-24 DIAGNOSIS — R293 Abnormal posture: Secondary | ICD-10-CM | POA: Diagnosis not present

## 2018-08-24 DIAGNOSIS — R2681 Unsteadiness on feet: Secondary | ICD-10-CM | POA: Diagnosis not present

## 2018-08-24 DIAGNOSIS — M542 Cervicalgia: Secondary | ICD-10-CM

## 2018-08-24 DIAGNOSIS — Z9181 History of falling: Secondary | ICD-10-CM

## 2018-08-25 ENCOUNTER — Other Ambulatory Visit: Payer: Self-pay | Admitting: Neurological Surgery

## 2018-08-25 ENCOUNTER — Ambulatory Visit: Payer: Medicare Other | Admitting: Family Medicine

## 2018-08-25 ENCOUNTER — Encounter: Payer: Medicare Other | Admitting: Physical Therapy

## 2018-08-25 DIAGNOSIS — M5416 Radiculopathy, lumbar region: Secondary | ICD-10-CM

## 2018-08-25 NOTE — Therapy (Signed)
[facility] [address] [place], [name], [address] Phone: [phone]   Fax:  [phone]  Physical Therapy Treatment & Reassessment / Progress Note  Patient Details  Name: [name] MRN: [id number] Date of Birth: January 12, 1941 Referring Provider (PT): Kathrynn Ducking, MD   Encounter Date: 08/24/2018   CLINIC OPERATION CHANGES: Outpatient Neuro Rehab is open at lower capacity following universal masking, social distancing, and patient screening.  The patient's COVID risk of complications score is 9.   PT End of Session - 08/24/18 1200    Visit Number  17   reassessment done at visit 17 so 10th visit note at visit 27   Number of Visits  20    Date for PT Re-Evaluation  08/26/18    Authorization Type  Medicare & [employer]    PT Start Time  1055    PT Stop Time  1150    PT Time Calculation (min)  55 min    Equipment Utilized During Treatment  Gait belt    Activity Tolerance  Patient tolerated treatment well    Behavior During Therapy  WFL for tasks assessed/performed       Past Medical History:  Diagnosis Date  . Bruises easily    d/t being on Eliquis  . Carotid artery disease ([place])    a. Duplex 05/2014: 01-74% RICA, 9-44% LICA, elevated velocities in right subclavian artery, normal left subclavian artery..  . Chronic back pain    HNP  . Chronic kidney disease (CKD)   . Claustrophobia    takes Ativan as needed  . 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 Coronary atherosclerosis of unspecified type of vessel, native or graft    a. cath in 2003 showed 10-20% LM, scattered 20% prox-mid LAD, 30% more distal LAD, 50-60% stenosis of LAD towards apex, 20% Cx, 30% dRCA, focal 95% stenosis in smaller of 2 branches of PDA, EF 60-65%.  . Diabetes mellitus ([place])   . Diarrhea    takes Imodium daily as needed  . Diverticulosis   . Diverticulosis of colon (without mention of hemorrhage)   . DJD  (degenerative joint disease)   . DVT (deep venous thrombosis) ([place]) 07/2011  . Esophageal stricture   . Essential hypertension    takes Imdur and Lisinopril daily  . Gastric polyps    benign  . GERD (gastroesophageal reflux disease)    takes Nexium daily  . Glaucoma    uses eye drops  . History of bronchitis    not sure when the last time  . History of colon polyps    benign  . History of kidney stones   . History of kidney stones   . Insomnia    takes Melatonin nightly as needed  . Mixed hyperlipidemia    takes Crestor daily  . Myasthenia gravis ([place])    uses prednisone  . Osteoporosis 2016  . Peripheral edema    takes Lasix daily  . Pituitary tumor    checks Dr[facility]Walden at [facility] checks it every Jan   . Pneumonia 01/2016  . PONV (postoperative nausea and vomiting)   . Ptosis   . Pulmonary embolus ([place]) 2012  . Stroke [facility]) 06/2015    Past Surgical History:  Procedure Laterality Date  . ABDOMINAL AORTOGRAM W/LOWER EXTREMITY N/A 11/18/2016   Procedure: ABDOMINAL AORTOGRAM W/LOWER EXTREMITY;  Surgeon: [name], MD;  Location: [facility];  Service: Cardiovascular;  Laterality:  N/A;  . AMPUTATION Right 02/07/2018   Procedure: right leg AMPUTATION ABOVE KNEE;  Surgeon: [name], MD;  Location: [facility];  Service: Vascular;  Laterality: Right;  . AORTOGRAM Right 12/23/2017   Procedure: AORTOGRAM, RIGHT LOWER EXTREMITY ANGIOGRAM, RIGHT SUPERFICIAL FEMORAL ARTERY ANGIOPLASTY WITH STENT, RIGHT POPLITEAL ARTERY ANGIOPLASTY WITH STENT, LEFT GROIN CUTDOWN AND LEFT FEMORAL ARTERY REPAIR;  Surgeon: [name], MD;  Location: [facility];  Service: Vascular;  Laterality: Right;  . BACK SURGERY     x2, lumbar and cervical  . CARDIAC CATHETERIZATION    . CATARACT EXTRACTION Bilateral 12/12  . COLONOSCOPY    . ESOPHAGOGASTRODUODENOSCOPY ENDOSCOPY  04/2015  . IR GENERIC HISTORICAL  03/03/2016   IR IVC FILTER PLMT / S&I [name] GUID/MOD SED 03/03/2016 [name], MD [facility]  . IR GENERIC HISTORICAL  04/09/2016   IR RADIOLOGIST EVAL & MGMT 04/09/2016 [name], MD [facility] INTER[facility]  . IR GENERIC HISTORICAL  04/30/2016   IR IVC FILTER RETRIEVAL / S&I [name] GUID/MOD SED 04/30/2016 [name], MD [facility]INTER[facility]  . LITHOTRIPSY    . LUMBAR LAMINECTOMY/DECOMPRESSION MICRODISCECTOMY Right 09/14/2012   Procedure: Right Lumbar three-four, four-five, Lumbar five-Sacral one decompressive laminectomy;  Surgeon: [name], MD;  Location: [facility];  Service: Neurosurgery;  Laterality: Right;  . LUMBAR LAMINECTOMY/DECOMPRESSION MICRODISCECTOMY Left 03/11/2016   Procedure: Left Lumbar Two-ThreeMicrodiscectomy;  Surgeon: [name], MD;  Location: [facility];  Service: Neurosurgery;  Laterality: Left;  . PERIPHERAL VASCULAR INTERVENTION  11/18/2016   Procedure: PERIPHERAL VASCULAR INTERVENTION;  Surgeon: [name], MD;  Location: [facility];  Service: Cardiovascular;;  Left popliteal  . SHOULDER SURGERY Left 05/07/2009  . TONSILLECTOMY      There were no vitals filed for this visit.  Subjective Assessment - 08/24/18 1057    Subjective  He saw Dr. Gladstone Lighter 3 days ago and he feels pain & weakness is related to his back. He has a MRI scheduled this Saturday to look at his neck. He has not worn prosthesis for last 2 weeks due to MD orders. Before that time he was wearing 4-5 hrs /total.  He limited wear due to difficulty & pain.    Patient is accompained by:  Family member   spouse   Limitations  Lifting;Standing;Walking;House hold activities    Patient Stated Goals  He wants to walk with prosthesis, get around house & back in community. Try golf again.     Currently in Pain?  Yes    Pain Score  4    In last week, worst 6/10, best 2/10   Pain Location  Back    Pain Orientation  Lower    Pain Descriptors / Indicators  Sharp    Pain Type  Chronic pain    Pain Radiating Towards  to left buttocks    Pain Onset  1 to 4 weeks ago    Pain  Frequency  Constant    Aggravating Factors   sitting on left buttocks, standing (less with prosthesis support)    Pain Relieving Factors  medications    Pain Score  3   in last week, worst 5/10, best 1-2/10   Pain Location  Neck    Pain Orientation  Left    Pain Descriptors / Indicators  Pins and needles;Aching    Pain Type  Chronic pain    Pain Radiating Towards  down to left hand mainly, occassionally right hand    Pain Onset  More than a month ago    Pain Frequency  Constant    Aggravating Factors   in mornings is worse, stiffness, posture    Pain Relieving Factors  medication          Prosthetics Assessment - 08/24/18 1100      Prosthetics   Prosthetic Care Dependent with  Skin check;Residual limb care;Prosthetic cleaning;Ply sock cleaning;Correct ply sock adjustment;Proper wear schedule/adjustment;Proper weight-bearing schedule/adjustment;Care of non-amputated limb    Prosthetic Care Comments   PT instructed to resume wearing prosthetic liner most of awake hours & will resume use of prosthesis in PT next session. PT recommended not using prosthesis at home for now.     Current prosthetic wear tolerance (days/week)   has not worn liner or prosthesis in last 2 weeks due to MD recommendation as left hip was assessed for pain.  Dr. Gladstone Lighter (per pt & wife) said could resume use in PT on 08/22/2018.    Current prosthetic wear tolerance (#hours/day)   none for last 2 weeks. PT recommended resume liner use during awake hours progressing to donne upon arising & doffing close to bed time.  Switch liners with afternoon bathing. Pt & wife verbalized understanding.     Edema  edema in residual limb has increased with only use of shrinker for last 2 weeks.     Residual limb condition   intact with no open areas. normal color & temperature.     K code/activity level with prosthetic use   K2 level, silicon liner with velcro lanyard suspension, SAFETY (single axis friction engaging) knee that flexes  with unweighting, single axis foot                  [facility] Adult PT Treatment/Exercise - 08/24/18 1100      Bed Mobility   Bed Mobility  Right Sidelying to Sit;Sit to Sidelying Right    Right Sidelying to Sit  Supervision/Verbal cueing;Minimal Assistance - Patient > 75%;Other (comment)   PT demo & verbal cues on using sidelying to protect back   Sit to Sidelying Right  Supervision/Verbal cueing;Other (comment)   PT demo & verbal cues on using sidelying to protect back     Transfers   Transfers  Squat Pivot Transfers;Lateral/Scoot Transfers    Stand Pivot Transfers  --    Squat Pivot Transfers  5: Supervision;With upper extremity assistance    Squat Pivot Transfer Details (indicate cue type and reason)  PT demo, tactile & verbal cues on technique with armrests in place on w/c and no prosthesis on RLE:  PT positioned w/c with right front edge of w/c touching bed /surface and left side of w/c close but enough room for LLE. Pt using right residual limb to assist transfer. Pt return demo 6 reps.     Lateral/Scoot Transfers  5: Supervision   w/c to/from toilet   Lateral/Scoot Transfer Details (indicate cue type and reason)  transfer scooting forward w/c to toilet (facing back of toilet) and then posterior scoot back into w/c. Verbal cues on set up & safety of this type transfer when prosthesis is off. Then transfer to right with right edge of w/c against toilet & left close but enough room for LLE using Rt residual limb to aid transfer.        Exercises   Exercises  Other Exercises    Other Exercises   passive stretching of left hamstring, left hip adductors for 30 sec holds for 4 reps each with pt in  supine on mat table. pt presents with tightness in both muscle groups with decr range with stretching. seated at edge of mat: left LE long arc quads with hamstring stretch hold with each rep due to tightness.       Moist Heat Therapy   Moist Heat Location  --      Financial planner Location  --    Electrical Stimulation Goals  --      Manual Therapy   Manual Therapy  --    Manual therapy comments  --    Soft tissue mobilization  --    Other Manual Therapy  --               PT Short Term Goals - 08/25/18 1020      PT SHORT TERM GOAL #1   Title  Patient able to donne prosthesis including tightening suspension strap with wife's supervision. (All STGs Target Date: 09/23/2018)    Time  4    Period  Weeks    Status  Revised    Target Date  09/23/18      PT SHORT TERM GOAL #2   Title  Patient tolerates wear of prosthetic liner >10hrs total/day & prosthesis >4hrs total /day.    Time  4    Period  Weeks    Status  Revised    Target Date  09/23/18      PT SHORT TERM GOAL #3   Title  Patient tolerates standing for 3 minutes & Standing balance with RW reaches 5" and scans environment with head & trunk movements safely with no loss of balance.    Time  4    Period  Weeks    Status  On-going    Target Date  09/23/18      PT SHORT TERM GOAL #4   Title  Patient ambulates 62' with RW & prosthesis with MinA.    Time  4    Period  Weeks    Status  Revised    Target Date  09/23/18      PT SHORT TERM GOAL #5   Title  Patient reports left hip pain </= 7/10 with transfers, standing & gait activities.    Time  4    Period  Weeks    Status  New    Target Date  09/23/18        PT Long Term Goals - 08/24/18 2000      PT LONG TERM GOAL #1   Title  Patient & wife verbalize & demonstrate understanding of proper prosthetic care to enable safe use of prosthesis. (All LTGs Target Date: 08/26/2018)    Baseline  NOT MET 08/24/2018  Patient has not worn or used prosthesis for last 2 weeks while left hip pain was assessed.  Patient had progressed to able to donne with wife's assist safely.    Time  12    Period  Weeks    Status  Not Met      PT LONG TERM GOAL #2   Title  Patient tolerates wearing prosthesis >80% of awake hours without skin  or limb pain issues to enable function throughout his day.    Baseline  NOT MET 08/24/2018 Patient has not worn prosthesis for last 2 weeks as left hip assessed. He was wearing prosthetic liner up to 8 hrs/day and prosthesis for 4-6 hours of that time.    Time  12    Period  Weeks  Status  Not Met      PT LONG TERM GOAL #3   Title  Patient standing balance with RW support modified independent: reaching 10" anteriorly, pick up item from floor & scans environment.     Baseline  NOT MET 08/24/2018 Prior to left hip pain onset early June, he had improved to standing balance with RW support with minA reaching 5" anteriorly, reaching to knee level towards floor and scanning.    Time  12    Period  Weeks    Status  Not Met      PT LONG TERM GOAL #4   Title  Patient ambulates 41' with RW & prosthesis with supervision to enable community mobility with family.    Baseline  NOT MET 08/24/2018  Prior to hip injury / pain in early June, he was ambulating up to 35' with RW & prosthesis with minA    Time  12    Period  Weeks    Status  Not Met      PT LONG TERM GOAL #5   Title  Patient negotiates ramps & curbs with RW & prosthesis with supervision to enable community access with family.    Baseline  NOT MET 08/26/2018  Prior to hip injury / pain, Patient was negotiating ramps & curbs with RW & prosthesis with moderate assist.    Time  12    Period  Weeks    Status  Not Met      PT LONG TERM GOAL #6   Title  Patient ambulates 105' indoors around furniture with RW & prosthesis modified independent to enable household mobility.    Baseline  NOT MET 08/24/2018  Prior to hip injury / pain in early June, he was ambulating up to 76' with RW & prosthesis with minA    Time  1    Period  Months    Status  Not Met         PT Long Term Goals - 08/25/18 1045      PT LONG TERM GOAL #1   Title  Patient & wife verbalize & demonstrate understanding of proper prosthetic care to enable safe use of prosthesis.  (All LTGs Target Date: 11/18/2018)    Time  12    Period  Weeks    Status  Revised    Target Date  11/18/18      PT LONG TERM GOAL #2   Title  Patient tolerates wearing prosthetic liner >80% of awake hours & prosthesis >8hr total/day without skin or limb pain issues to enable function throughout his day.    Time  12    Period  Weeks    Status  Revised    Target Date  11/18/18      PT LONG TERM GOAL #3   Title  Patient standing balance with RW support with wife's supervision: reaching 10" anteriorly, managing clothes for toileting & scans environment with no loss of balance.    Time  12    Period  Weeks    Status  Revised    Target Date  11/18/18      PT LONG TERM GOAL #4   Title  Patient ambulates 100' with RW & prosthesis with supervision with wife safely.    Time  12    Period  Weeks    Status  Revised    Target Date  11/18/18      PT LONG TERM GOAL #5   Title  Patient  reports back, left leg & left arm pain </= 5/10 with standing & gait activities.    Time  12    Period  Weeks    Status  New    Target Date  11/18/18      PT LONG TERM GOAL #6   Title  Patient demonstrates & verbalizes understanding of ongoing HEP for maintaining strength, flexibility & endurance at functional level    Time  12    Period  Weeks    Status  New    Target Date  11/18/18           Plan - 08/25/18 1025    Clinical Impression Statement  Patient developed left hip pain in early June after only 5 PT visits which began to limit his mobility. PT began treating left hip adductor as muscle strain but did no improve significantly. He saw his orthopedist, Dr. Gladstone Lighter, who recommended stopping prosthesis use for last 2 weeks. On office visit 08/22/2018, per patient Dr. Gladstone Lighter feels pain is related to low back issues. His left arm & leg have progressively become weaker over last month especially with no or limited prosthesis use. When he sits in w/c he becomes more deconditioned. He fell transferring  without prosthesis in last week reporting his left leg "gave out"  Patient continues to have left hip & low back pain up to 10/10. He also has cervical / upper back pain radating into LUE & has MRI scheduled for 08/27/2018. Patient has potential to use prosthesis with family assistance to enable mobility, balance & household gait for limited amount. Keeping this elderly gentleman mobile even at limited amount will help decrease burden of care for elderly wife, improve quality of life and maintain strength for safer mobility. Patient requires skilled PT to improve safety & mobility to level of LTGs.    Personal Factors and Comorbidities  Age;Comorbidity 3+;Fitness;Past/Current Experience    Comorbidities  Back surgery x 4; Tonsillectomy; Cataract extraction (Bilateral, 12/12); Shoulder surgery (Left, 05/07/2009); Cardiac catheterization; Lumbar laminectomy/decompression microdiscectomy (Right, 09/14/2012); Esophagogastroduodenoscopy endoscopy (04/2015); ir generic historical (03/03/2016); Colonoscopy; Lithotripsy; Lumbar laminectomy/decompression microdiscectomy (Left, 03/11/2016); ir generic historical (04/09/2016); ir generic historical (04/30/2016); ABDOMINAL AORTOGRAM W/LOWER EXTREMITY (N/A, 11/18/2016); PERIPHERAL VASCULAR INTERVENTION (11/18/2016); Aortogram (Right, 12/23/2017); and Amputation Transfemoral (Right, 02/07/2018).    Examination-Activity Limitations  Hygiene/Grooming;Lift;Locomotion Level;Reach Overhead;Stairs;Stand;Toileting;Transfers    Examination-Participation Restrictions  Church;Community Activity;Driving    Stability/Clinical Decision Making  Evolving/Moderate complexity    Rehab Potential  Good    PT Frequency  2x / week    PT Duration  12 weeks    PT Treatment/Interventions  ADLs/Self Care Home Management;Stair training;Gait training;DME Instruction;Functional mobility training;Therapeutic activities;Therapeutic exercise;Balance training;Neuromuscular re-education;Patient/family  education;Prosthetic Training;Manual techniques;Vestibular;Cryotherapy;Electrical Stimulation;Moist Heat;Ultrasound    PT Next Visit Plan  check on outcome of MRI, modalities & manual technique for back & left hip pain, prosthetic training with RW standing balance & limited gait.    Consulted and Agree with Plan of Care  Patient;Family member/caregiver    Family Member Consulted  wife, Jene Oravec       Patient will benefit from skilled therapeutic intervention in order to improve the following deficits and impairments:  Abnormal gait, Decreased activity tolerance, Decreased balance, Decreased endurance, Decreased knowledge of use of DME, Decreased mobility, Decreased range of motion, Decreased strength, Impaired flexibility, Prosthetic Dependency, Postural dysfunction  Visit Diagnosis: 1. Muscle weakness (generalized)   2. Abnormal posture   3. Other abnormalities of gait and mobility   4. Unsteadiness on feet  5. History of falling   6. Pain in left hip   7. Chronic left-sided low back pain without sciatica   8. Cervicalgia        Problem List Patient Active Problem List   Diagnosis Date Noted  . Neuropathic pain of right ankle 06/27/2018  . Insomnia due to medical condition 04/13/2018  . Atherosclerosis of native coronary artery of native heart without angina pectoris   . Hypokalemia   . Stage 3 chronic kidney disease ([place])   . 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 Acute blood loss anemia 02/08/2018  . Post-operative pain   . Unilateral AKA, right ([place])   . Ischemic foot 02/05/2018  . Ischemia of right lower extremity   . Pressure injury of skin 12/27/2017  . Arterial occlusive disease   . Postoperative pain   . CKD (chronic kidney disease), stage III ([place])   . History of CVA (cerebrovascular accident)   . Femoral artery thrombosis, right ([place]) 12/23/2017  . Septic arthritis of left sternoclavicular joint ([place]) 11/03/2017  . Cervical spinal cord compression ([place])  10/30/2017  . Nasal abscess 09/25/2017  . Immunocompromised ([place]) 09/25/2017  . Acute on chronic blood loss anemia 08/05/2017  . Transient ischemic attack (TIA) 07/23/2017  . Subcortical infarction ([place])   . Right hemiparesis ([place])   . Ataxia 07/21/2017  . TIA (transient ischemic attack)   . Wheezing 06/17/2017  . Bursitis of left elbow 05/01/2017  . Dystonia 12/29/2016  . PAD (peripheral artery disease) ([place]) 11/18/2016  . Osteoporosis 08/28/2016  . Paronychia of great toe, left 08/10/2016  . Carotid arterial disease ([place]) 07/09/2016  . Hypocalcemia 07/09/2016  . Diplopia 07/02/2016  . S/P lumbar laminectomy 03/11/2016  . Radicular pain of left lower extremity 02/21/2016  . Cough 01/10/2016  . 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 Myasthenia gravis ([place])   . Diabetes mellitus type 2 in nonobese (HCC)   . Ataxia, post-stroke   . Gait disturbance, post-stroke   . Proximal leg weakness   . Cervical spondylosis without myelopathy   . Achilles tendinitis of right lower extremity   . Chronic diastolic CHF (congestive heart failure) ([place])   . Gastroesophageal reflux disease without esophagitis   . 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 Cerebellar stroke ([place])   . Ischemic stroke ([place])   . Cerebrovascular accident (CVA) due to thrombosis of left vertebral artery ([place])   . History of pulmonary embolus ([place])   . Chronic anticoagulation   . HLD (hyperlipidemia)   . Neck pain 03/19/2015  . Pain in the chest   . 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[place])   . Diabetic gastroparesis ([place]) 03/07/2015  . Screening for osteoporosis 07/19/2014  . Dyspnea 04/09/2014  . Edema 04/09/2014  . Routine general medical examination at a health care facility 10/23/2013  . Bilateral carotid bruits 05/24/2013  . Abnormal CT scan, lung 05/24/2013  . Encounter for therapeutic drug monitoring 03/14/2013  .  Lymphadenopathy, inguinal 01/11/2013  . AKI (acute kidney injury) ([place]) 10/28/2012  . 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 Myasthenia gravis with exacerbation ([place]) 03/31/2012  . Disease of salivary gland 12/09/2011  . Left facial pain 10/28/2011  . Pulmonary embolism ([place]) 10/25/2011  . 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 GERD with stricture 09/23/2011  . Long term (current) use of anticoagulants 08/03/2011  . History of pulmonary embolism 07/30/2011  . History of DVT (deep vein thrombosis) 07/30/2011  .  Subconjunctival hemorrhage 06/22/2011  . Bilateral conjunctivitis 04/09/2011  . Diabetes mellitus type 2, controlled ([place]) 12/18/2010  . Hearing loss 08/04/2010  . Ptosis of eyelid 12/30/2009  . Essential hypertension 04/24/2009  . HYPERLIPIDEMIA, MIXED 10/05/2008  . HEMORRHOIDS-INTERNAL 07/02/2008  . HEMORRHOIDS-EXTERNAL 07/02/2008  . DIVERTICULOSIS-COLON 07/02/2008  . PERSONAL HX COLONIC POLYPS 07/02/2008  . RASH-NONVESICULAR 02/07/2008  . PNEUMONIA 12/08/2007  . Chronic myeloid leukemia in remission ([place]) 07/07/2007  . CAD (coronary artery disease) 07/07/2007  . NEPHROLITHIASIS, HX OF 07/07/2007    Jamey Reas PT, DPT 08/25/2018, 10:43 AM  [facility] [address] [place], [name], [address] Phone: [phone]   Fax:  [phone]  Name: [name] MRN: [id number] Date of Birth: Oct 10, 1940

## 2018-08-26 ENCOUNTER — Other Ambulatory Visit: Payer: Self-pay | Admitting: Neurology

## 2018-08-27 ENCOUNTER — Other Ambulatory Visit: Payer: Self-pay

## 2018-08-27 ENCOUNTER — Ambulatory Visit
Admission: RE | Admit: 2018-08-27 | Discharge: 2018-08-27 | Disposition: A | Discharge status: Home / Self Care | Payer: Medicare Other | Source: Ambulatory Visit | Attending: Student | Admitting: Student

## 2018-08-27 DIAGNOSIS — M4802 Spinal stenosis, cervical region: Secondary | ICD-10-CM

## 2018-08-29 ENCOUNTER — Other Ambulatory Visit: Payer: Self-pay

## 2018-08-29 ENCOUNTER — Ambulatory Visit: Payer: Medicare Other | Admitting: Physical Therapy

## 2018-08-29 ENCOUNTER — Encounter: Payer: Self-pay | Admitting: Physical Therapy

## 2018-08-29 DIAGNOSIS — R293 Abnormal posture: Secondary | ICD-10-CM | POA: Diagnosis not present

## 2018-08-29 DIAGNOSIS — M542 Cervicalgia: Secondary | ICD-10-CM

## 2018-08-29 DIAGNOSIS — M6281 Muscle weakness (generalized): Secondary | ICD-10-CM

## 2018-08-29 DIAGNOSIS — R2689 Other abnormalities of gait and mobility: Secondary | ICD-10-CM

## 2018-08-29 DIAGNOSIS — M25552 Pain in left hip: Secondary | ICD-10-CM

## 2018-08-29 DIAGNOSIS — G8929 Other chronic pain: Secondary | ICD-10-CM | POA: Diagnosis not present

## 2018-08-29 DIAGNOSIS — M545 Low back pain, unspecified: Secondary | ICD-10-CM

## 2018-08-29 DIAGNOSIS — R2681 Unsteadiness on feet: Secondary | ICD-10-CM

## 2018-08-29 DIAGNOSIS — Z9181 History of falling: Secondary | ICD-10-CM

## 2018-08-29 NOTE — Therapy (Signed)
[facility] [address] [place], [name], [address] Phone: [phone]   Fax:  [phone]  Physical Therapy Treatment  Patient Details  Name: [name] MRN: [id number] Date of Birth: 1940/08/30 Referring Provider (PT): Kathrynn Ducking, MD   Encounter Date: 08/29/2018   CLINIC OPERATION CHANGES: [facility]patient [facility] is open at lower capacity following universal masking, social distancing, and patient screening.  The patient's COVID risk of complications score is 9.   PT End of Session - 08/29/18 2146    Visit Number  18   reassessment done at visit 17 so 10th visit note at visit 27   Number of Visits  20    Date for PT Re-Evaluation  08/26/18    Authorization Type  Medicare & [employer]    PT Start Time  1400    PT Stop Time  1447    PT Time Calculation (min)  47 min    Equipment Utilized During Treatment  Gait belt    Activity Tolerance  Patient tolerated treatment well;Patient limited by fatigue;Patient limited by pain    Behavior During Therapy  WFL for tasks assessed/performed       Past Medical History:  Diagnosis Date  . Bruises easily    d/t being on Eliquis  . Carotid artery disease ([place])    a. Duplex 05/2014: 30-09% RICA, 2-33% LICA, elevated velocities in right subclavian artery, normal left subclavian artery..  . Chronic back pain    HNP  . Chronic kidney disease (CKD)   . Claustrophobia    takes Ativan as needed  . 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 Coronary atherosclerosis of unspecified type of vessel, native or graft    a. cath in 2003 showed 10-20% LM, scattered 20% prox-mid LAD, 30% more distal LAD, 50-60% stenosis of LAD towards apex, 20% Cx, 30% dRCA, focal 95% stenosis in smaller of 2 branches of PDA, EF 60-65%.  . Diabetes mellitus ([place])   . Diarrhea    takes Imodium daily as needed  . Diverticulosis   . Diverticulosis of colon (without mention of  hemorrhage)   . DJD (degenerative joint disease)   . DVT (deep venous thrombosis) ([place]) 07/2011  . Esophageal stricture   . Essential hypertension    takes Imdur and Lisinopril daily  . Gastric polyps    benign  . GERD (gastroesophageal reflux disease)    takes Nexium daily  . Glaucoma    uses eye drops  . History of bronchitis    not sure when the last time  . History of colon polyps    benign  . History of kidney stones   . History of kidney stones   . Insomnia    takes Melatonin nightly as needed  . Mixed hyperlipidemia    takes Crestor daily  . Myasthenia gravis ([place])    uses prednisone  . Osteoporosis 2016  . Peripheral edema    takes Lasix daily  . Pituitary tumor    [facility] Dr[facility]Walden at [facility] checks it every Jan   . Pneumonia 01/2016  . PONV (postoperative nausea and vomiting)   . Ptosis   . Pulmonary embolus ([place]) 2012  . Stroke [facility]) 06/2015    Past Surgical History:  Procedure Laterality Date  . ABDOMINAL AORTOGRAM W/LOWER EXTREMITY N/A 11/18/2016   Procedure: ABDOMINAL AORTOGRAM W/LOWER EXTREMITY;  Surgeon: [name], MD;  Location: [facility];  Service: Cardiovascular;  Laterality: N/A;  . AMPUTATION Right 02/07/2018   Procedure: right leg AMPUTATION ABOVE KNEE;  Surgeon: [name], MD;  Location: [facility];  Service: Vascular;  Laterality: Right;  . AORTOGRAM Right 12/23/2017   Procedure: AORTOGRAM, RIGHT LOWER EXTREMITY ANGIOGRAM, RIGHT SUPERFICIAL FEMORAL ARTERY ANGIOPLASTY WITH STENT, RIGHT POPLITEAL ARTERY ANGIOPLASTY WITH STENT, LEFT GROIN CUTDOWN AND LEFT FEMORAL ARTERY REPAIR;  Surgeon: [name], MD;  Location: [facility];  Service: Vascular;  Laterality: Right;  . BACK SURGERY     x2, lumbar and cervical  . CARDIAC CATHETERIZATION    . CATARACT EXTRACTION Bilateral 12/12  . COLONOSCOPY    . ESOPHAGOGASTRODUODENOSCOPY ENDOSCOPY  04/2015  . IR GENERIC HISTORICAL  03/03/2016   IR IVC FILTER PLMT / S&I [name] GUID/MOD  SED 03/03/2016 [name], MD [facility]  . IR GENERIC HISTORICAL  04/09/2016   IR RADIOLOGIST EVAL & MGMT 04/09/2016 [name], MD [facility]  . IR GENERIC HISTORICAL  04/30/2016   IR IVC FILTER RETRIEVAL / S&I [name] GUID/MOD SED 04/30/2016 [name], MD [facility]INTER[facility]  . LITHOTRIPSY    . LUMBAR LAMINECTOMY/DECOMPRESSION MICRODISCECTOMY Right 09/14/2012   Procedure: Right Lumbar three-four, four-five, Lumbar five-Sacral one decompressive laminectomy;  Surgeon: [name], MD;  Location: [facility];  Service: Neurosurgery;  Laterality: Right;  . LUMBAR LAMINECTOMY/DECOMPRESSION MICRODISCECTOMY Left 03/11/2016   Procedure: Left Lumbar Two-ThreeMicrodiscectomy;  Surgeon: [name], MD;  Location: [facility];  Service: Neurosurgery;  Laterality: Left;  . PERIPHERAL VASCULAR INTERVENTION  11/18/2016   Procedure: PERIPHERAL VASCULAR INTERVENTION;  Surgeon: [name], MD;  Location: [facility];  Service: Cardiovascular;;  Left popliteal  . SHOULDER SURGERY Left 05/07/2009  . TONSILLECTOMY      There were no vitals filed for this visit.  Subjective Assessment - 08/29/18 1354    Subjective  MRI went well and has appt tomorrow to get results.  They only looked at neck not back on Saturday. They scheduled 2nd MRI for 7/26 for back.    Patient is accompained by:  Family member   spouse   Limitations  Lifting;Standing;Walking;House hold activities    Patient Stated Goals  He wants to walk with prosthesis, get around house & back in community. Try golf again.     Currently in Pain?  Yes    Pain Score  6     Pain Location  Neck    Pain Orientation  Posterior;Left;Right    Pain Descriptors / Indicators  Aching;Radiating;Pins and needles;Numbness;Heaviness    Pain Type  Chronic pain    Pain Radiating Towards  BUEs to hands R>L    Pain Onset  More than a month ago    Pain Frequency  Constant    Aggravating Factors   sitting & supine    Pain Relieving Factors  medications     Multiple Pain Sites  Yes    Pain Score  6    Pain Location  Back    Pain Orientation  Posterior;Left    Pain Descriptors / Indicators  Aching;Burning;Radiating;Sore    Pain Type  Chronic pain    Pain Radiating Towards  LLE to knee    Pain Onset  More than a month ago    Pain Frequency  Constant    Aggravating Factors   sitting & standing                       [facility] Adult PT Treatment/Exercise -  08/29/18 1400      Transfers   Transfers  Sit to Stand;Stand to Sit    Sit to Stand  4: Min assist;With upper extremity assist;With armrests;From chair/3-in-1   to RW for stabilization   Sit to Stand Details  Verbal cues for sequencing;Verbal cues for technique;Verbal cues for safe use of DME/AE    Stand to Sit  4: Min guard;With upper extremity assist;To chair/3-in-1;With armrests   from RW for stability   Stand to Sit Details (indicate cue type and reason)  Verbal cues for sequencing;Verbal cues for technique;Verbal cues for safe use of DME/AE      Ambulation/Gait   Ambulation/Gait  Yes    Ambulation/Gait Assistance  3: Mod assist    Ambulation/Gait Assistance Details  Manual / tactile & verbal cues on prosthesis control, wt shift & sequence    Ambulation Distance (Feet)  40 Feet    Assistive device  Rolling walker;Prosthesis    Gait Pattern  Step-to pattern;Decreased step length - left;Decreased stance time - right;Decreased stride length;Decreased hip/knee flexion - right;Decreased weight shift to right;Right hip hike;Right circumduction;Antalgic;Lateral hip instability;Trunk flexed;Narrow base of support;Poor foot clearance - right    Ambulation Surface  Indoor;Level      Therapeutic Activites    Other Therapeutic Activities  standing with RW support scanning rt/lt and up/down      Neuro Re-ed    Neuro Re-ed Details   PT demo, tactile & verbal cues on decreasing head forward posture: sitting with cervical retractions against pillow behind head;  PT demo positioning  in bed supine with wedge to enable head up to aid breathing and minimize head forward position. Pt & wife verbalized understanding.       Prosthetics   Current prosthetic wear tolerance (days/week)   wore liner for last 3 days    Current prosthetic wear tolerance (#hours/day)   liner only 8-10 hrs    Residual limb condition   intact with no open areas. normal color & temperature.     Education Provided  Skin check;Residual limb care;Proper Donning;Proper Doffing;Proper wear schedule/adjustment    Person(s) Educated  Patient;Spouse    Education Method  Explanation;Verbal cues    Education Method  Verbalized understanding;Verbal cues required;Needs further instruction    Donning Prosthesis  Moderate assist    Doffing Prosthesis  Minimal assist               PT Short Term Goals - 08/25/18 1020      PT SHORT TERM GOAL #1   Title  Patient able to donne prosthesis including tightening suspension strap with wife's supervision. (All STGs Target Date: 09/23/2018)    Time  4    Period  Weeks    Status  Revised    Target Date  09/23/18      PT SHORT TERM GOAL #2   Title  Patient tolerates wear of prosthetic liner >10hrs total/day & prosthesis >4hrs total /day.    Time  4    Period  Weeks    Status  Revised    Target Date  09/23/18      PT SHORT TERM GOAL #3   Title  Patient tolerates standing for 3 minutes & Standing balance with RW reaches 5" and scans environment with head & trunk movements safely with no loss of balance.    Time  4    Period  Weeks    Status  On-going    Target Date  09/23/18  PT SHORT TERM GOAL #4   Title  Patient ambulates 37' with RW & prosthesis with MinA.    Time  4    Period  Weeks    Status  Revised    Target Date  09/23/18      PT SHORT TERM GOAL #5   Title  Patient reports left hip pain </= 7/10 with transfers, standing & gait activities.    Time  4    Period  Weeks    Status  New    Target Date  09/23/18        PT Long Term Goals -  08/25/18 1045      PT LONG TERM GOAL #1   Title  Patient & wife verbalize & demonstrate understanding of proper prosthetic care to enable safe use of prosthesis. (All LTGs Target Date: 11/18/2018)    Time  12    Period  Weeks    Status  Revised    Target Date  11/18/18      PT LONG TERM GOAL #2   Title  Patient tolerates wearing prosthetic liner >80% of awake hours & prosthesis >8hr total/day without skin or limb pain issues to enable function throughout his day.    Time  12    Period  Weeks    Status  Revised    Target Date  11/18/18      PT LONG TERM GOAL #3   Title  Patient standing balance with RW support with wife's supervision: reaching 10" anteriorly, managing clothes for toileting & scans environment with no loss of balance.    Time  12    Period  Weeks    Status  Revised    Target Date  11/18/18      PT LONG TERM GOAL #4   Title  Patient ambulates 100' with RW & prosthesis with supervision with wife safely.    Time  12    Period  Weeks    Status  Revised    Target Date  11/18/18      PT LONG TERM GOAL #5   Title  Patient reports back, left leg & left arm pain </= 5/10 with standing & gait activities.    Time  12    Period  Weeks    Status  New    Target Date  11/18/18      PT LONG TERM GOAL #6   Title  Patient demonstrates & verbalizes understanding of ongoing HEP for maintaining strength, flexibility & endurance at functional level    Time  12    Period  Weeks    Status  New    Target Date  11/18/18            Plan - 08/29/18 2147    Clinical Impression Statement  PT educated on decreasing head forward posture in sitting and supine. PT resumed prosthetic training today. He was able to tolerate standing balance & short distance gait with PT assistance. He was tearful when not able to ambulate as far as he did 6 weeks ago before pain limited standing & gait.    Personal Factors and Comorbidities  Age;Comorbidity 3+;Fitness;Past/Current Experience     Comorbidities  Back surgery x 4; Tonsillectomy; Cataract extraction (Bilateral, 12/12); Shoulder surgery (Left, 05/07/2009); Cardiac catheterization; Lumbar laminectomy/decompression microdiscectomy (Right, 09/14/2012); Esophagogastroduodenoscopy endoscopy (04/2015); ir generic historical (03/03/2016); Colonoscopy; Lithotripsy; Lumbar laminectomy/decompression microdiscectomy (Left, 03/11/2016); ir generic historical (04/09/2016); ir generic historical (04/30/2016); ABDOMINAL AORTOGRAM W/LOWER EXTREMITY (N/A, 11/18/2016); PERIPHERAL  VASCULAR INTERVENTION (11/18/2016); Aortogram (Right, 12/23/2017); and Amputation Transfemoral (Right, 02/07/2018).    Examination-Activity Limitations  Hygiene/Grooming;Lift;Locomotion Level;Reach Overhead;Stairs;Stand;Toileting;Transfers    Examination-Participation Restrictions  Church;Community Activity;Driving    Stability/Clinical Decision Making  Evolving/Moderate complexity    Rehab Potential  Good    PT Frequency  2x / week    PT Duration  12 weeks    PT Treatment/Interventions  ADLs/Self Care Home Management;Stair training;Gait training;DME Instruction;Functional mobility training;Therapeutic activities;Therapeutic exercise;Balance training;Neuromuscular re-education;Patient/family education;Prosthetic Training;Manual techniques;Vestibular;Cryotherapy;Electrical Stimulation;Moist Heat;Ultrasound    PT Next Visit Plan  modalities & manual technique for back & left hip pain, prosthetic training with RW standing balance & limited gait.    Consulted and Agree with Plan of Care  Patient;Family member/caregiver    Family Member Consulted  wife, Christpoher Sievers       Patient will benefit from skilled therapeutic intervention in order to improve the following deficits and impairments:  Abnormal gait, Decreased activity tolerance, Decreased balance, Decreased endurance, Decreased knowledge of use of DME, Decreased mobility, Decreased range of motion, Decreased strength, Impaired  flexibility, Prosthetic Dependency, Postural dysfunction  Visit Diagnosis: 1. Muscle weakness (generalized)   2. Abnormal posture   3. Other abnormalities of gait and mobility   4. Unsteadiness on feet   5. History of falling   6. Cervicalgia   7. Chronic left-sided low back pain without sciatica   8. Pain in left hip        Problem List Patient Active Problem List   Diagnosis Date Noted  . Neuropathic pain of right ankle 06/27/2018  . Insomnia due to medical condition 04/13/2018  . Atherosclerosis of native coronary artery of native heart without angina pectoris   . Hypokalemia   . Stage 3 chronic kidney disease ([place])   . 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 Acute blood loss anemia 02/08/2018  . Post-operative pain   . Unilateral AKA, right ([place])   . Ischemic foot 02/05/2018  . Ischemia of right lower extremity   . Pressure injury of skin 12/27/2017  . Arterial occlusive disease   . Postoperative pain   . CKD (chronic kidney disease), stage III ([place])   . History of CVA (cerebrovascular accident)   . 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 Septic arthritis of left sternoclavicular joint ([place]) 11/03/2017  . Cervical spinal cord compression ([place]) 10/30/2017  . Nasal abscess 09/25/2017  . Immunocompromised ([place]) 09/25/2017  . Acute on chronic blood loss anemia 08/05/2017  . Transient ischemic attack (TIA) 07/23/2017  . Subcortical infarction ([place])   . Right hemiparesis ([place])   . Ataxia 07/21/2017  . TIA (transient ischemic attack)   . Wheezing 06/17/2017  . Bursitis of left elbow 05/01/2017  . Dystonia 12/29/2016  . PAD (peripheral artery disease) ([place]) 11/18/2016  . Osteoporosis 08/28/2016  . Paronychia of great toe, left 08/10/2016  . Carotid arterial disease ([place]) 07/09/2016  . Hypocalcemia 07/09/2016  . Diplopia 07/02/2016  . S/P lumbar laminectomy 03/11/2016  . Radicular pain of left lower extremity 02/21/2016  . Cough 01/10/2016  . 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 Myasthenia gravis ([place])   . Diabetes mellitus type 2 in nonobese (H[facility])   . Ataxia, post-stroke   . Gait disturbance, post-stroke   . Proximal leg weakness   . Cervical spondylosis without myelopathy   . Achilles tendinitis of right lower extremity   . Chronic diastolic CHF (congestive heart failure) ([place])   . Gastroesophageal reflux disease without esophagitis   .  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 Cerebellar stroke ([place])   . Ischemic stroke ([place])   . Cerebrovascular accident (CVA) due to thrombosis of left vertebral artery ([place])   . History of pulmonary embolus ([place])   . Chronic anticoagulation   . HLD (hyperlipidemia)   . Neck pain 03/19/2015  . Pain in the chest   . 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[place])   . Diabetic gastroparesis ([place]) 03/07/2015  . Screening for osteoporosis 07/19/2014  . Dyspnea 04/09/2014  . Edema 04/09/2014  . Routine general medical examination at a health care facility 10/23/2013  . Bilateral carotid bruits 05/24/2013  . Abnormal CT scan, lung 05/24/2013  . Encounter for therapeutic drug monitoring 03/14/2013  . Lymphadenopathy, inguinal 01/11/2013  . AKI (acute kidney injury) ([place]) 10/28/2012  . 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 Myasthenia gravis with exacerbation ([place]) 03/31/2012  . Disease of salivary gland 12/09/2011  . Left facial pain 10/28/2011  . Pulmonary embolism ([place]) 10/25/2011  . 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 GERD with stricture 09/23/2011  . Long term (current) use of anticoagulants 08/03/2011  . History of pulmonary embolism 07/30/2011  . History of DVT (deep vein thrombosis) 07/30/2011  . Subconjunctival hemorrhage 06/22/2011  . Bilateral conjunctivitis 04/09/2011  . Diabetes mellitus type 2, controlled ([place]) 12/18/2010  . Hearing loss 08/04/2010  . Ptosis of eyelid 12/30/2009  . Essential  hypertension 04/24/2009  . HYPERLIPIDEMIA, MIXED 10/05/2008  . HEMORRHOIDS-INTERNAL 07/02/2008  . HEMORRHOIDS-EXTERNAL 07/02/2008  . DIVERTICULOSIS-COLON 07/02/2008  . PERSONAL HX COLONIC POLYPS 07/02/2008  . RASH-NONVESICULAR 02/07/2008  . PNEUMONIA 12/08/2007  . Chronic myeloid leukemia in remission ([place]) 07/07/2007  . CAD (coronary artery disease) 07/07/2007  . NEPHROLITHIASIS, HX OF 07/07/2007    Jamey Reas PT, DPT 08/29/2018, 9:53 PM  [facility] [address] [place], [name], [address] Phone: [phone]   Fax:  [phone]  Name: [name] MRN: [id number] Date of Birth: October 06, 1940

## 2018-08-30 DIAGNOSIS — M4802 Spinal stenosis, cervical region: Secondary | ICD-10-CM | POA: Diagnosis not present

## 2018-08-31 ENCOUNTER — Telehealth: Payer: Self-pay

## 2018-08-31 NOTE — Telephone Encounter (Signed)

## 2018-09-01 ENCOUNTER — Encounter: Payer: Self-pay | Admitting: Physical Therapy

## 2018-09-01 ENCOUNTER — Ambulatory Visit (INDEPENDENT_AMBULATORY_CARE_PROVIDER_SITE_OTHER): Payer: Medicare Other | Admitting: Family Medicine

## 2018-09-01 ENCOUNTER — Encounter: Payer: Self-pay | Admitting: Family Medicine

## 2018-09-01 ENCOUNTER — Other Ambulatory Visit: Payer: Self-pay

## 2018-09-01 ENCOUNTER — Ambulatory Visit: Payer: Medicare Other | Admitting: Physical Therapy

## 2018-09-01 VITALS — BP 130/80 | HR 84 | Ht 66.0 in

## 2018-09-01 DIAGNOSIS — R293 Abnormal posture: Secondary | ICD-10-CM | POA: Diagnosis not present

## 2018-09-01 DIAGNOSIS — E1159 Type 2 diabetes mellitus with other circulatory complications: Secondary | ICD-10-CM

## 2018-09-01 DIAGNOSIS — R2689 Other abnormalities of gait and mobility: Secondary | ICD-10-CM

## 2018-09-01 DIAGNOSIS — M6281 Muscle weakness (generalized): Secondary | ICD-10-CM

## 2018-09-01 DIAGNOSIS — M545 Low back pain, unspecified: Secondary | ICD-10-CM

## 2018-09-01 DIAGNOSIS — M25552 Pain in left hip: Secondary | ICD-10-CM

## 2018-09-01 DIAGNOSIS — R2681 Unsteadiness on feet: Secondary | ICD-10-CM | POA: Diagnosis not present

## 2018-09-01 DIAGNOSIS — G8929 Other chronic pain: Secondary | ICD-10-CM | POA: Diagnosis not present

## 2018-09-01 NOTE — Progress Notes (Signed)
Established Patient Office Visit  Subjective:  Patient ID: [name], male    DOB: 1940/04/21  Age: 78 y.o. MRN: [id number]  CC:  Chief Complaint  Patient presents with  . Follow-up    HPI [name] presents for follow-up of his diabetes that is been well controlled.  Continues to struggle.  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  Dense paresthesias in his left hand with weakness.  Neurosurgery is recommending operative intervention.  Patient is nervous about coming off of his Eliquis for 5 days.  He has no history of carotid artery disease or atrial fibrillation.  Status post multiple CVAs and history of PE.    Past Medical History:  Diagnosis Date  . Bruises easily    d/t being on Eliquis  . Carotid artery disease ([place])    a. Duplex 05/2014: 78-24% RICA, 2-35% LICA, elevated velocities in right subclavian artery, normal left subclavian artery..  . Chronic back pain    HNP  . Chronic kidney disease (CKD)   . Claustrophobia    takes Ativan as needed  . 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 Coronary atherosclerosis of unspecified type of vessel, native or graft    a. cath in 2003 showed 10-20% LM, scattered 20% prox-mid LAD, 30% more distal LAD, 50-60% stenosis of LAD towards apex, 20% Cx, 30% dRCA, focal 95% stenosis in smaller of 2 branches of PDA, EF 60-65%.  . Diabetes mellitus ([place])   . Diarrhea    takes Imodium daily as needed  . Diverticulosis   . Diverticulosis of colon (without mention of hemorrhage)   . DJD (degenerative joint disease)   . DVT (deep venous thrombosis) ([place]) 07/2011  . Esophageal stricture   . Essential hypertension    takes Imdur and Lisinopril daily  . Gastric polyps    benign  . GERD (gastroesophageal reflux disease)    takes Nexium daily  . Glaucoma    uses eye drops  . History of bronchitis    not sure when the last time  . History of  colon polyps    benign  . History of kidney stones   . History of kidney stones   . Insomnia    takes Melatonin nightly as needed  . Mixed hyperlipidemia    takes Crestor daily  . Myasthenia gravis ([place])    uses prednisone  . Osteoporosis 2016  . Peripheral edema    takes Lasix daily  . Pituitary tumor    checks Dr[facility]Walden at [facility] checks it every Jan   . Pneumonia 01/2016  . PONV (postoperative nausea and vomiting)   . Ptosis   . Pulmonary embolus ([place]) 2012  . Stroke [facility]) 06/2015    Past Surgical History:  Procedure Laterality Date  . ABDOMINAL AORTOGRAM W/LOWER EXTREMITY N/A 11/18/2016   Procedure: ABDOMINAL AORTOGRAM W/LOWER EXTREMITY;  Surgeon: [name], MD;  Location: [place] [facility];  Service: Cardiovascular;  Laterality: N/A;  . AMPUTATION Right 02/07/2018   Procedure: right leg AMPUTATION ABOVE KNEE;  Surgeon: [name], MD;  Location: [place];  Service: Vascular;  Laterality: Right;  . AORTOGRAM Right 12/23/2017   Procedure: AORTOGRAM, RIGHT LOWER EXTREMITY ANGIOGRAM, RIGHT SUPERFICIAL FEMORAL ARTERY ANGIOPLASTY WITH STENT, RIGHT POPLITEAL ARTERY ANGIOPLASTY WITH STENT, LEFT GROIN CUTDOWN AND LEFT FEMORAL ARTERY REPAIR;  Surgeon: [name], MD;  Location: [place];  Service: Vascular;  Laterality: Right;  . BACK SURGERY     x2, lumbar and cervical  . CARDIAC CATHETERIZATION    . CATARACT EXTRACTION Bilateral 12/12  . COLONOSCOPY    . ESOPHAGOGASTRODUODENOSCOPY ENDOSCOPY  04/2015  . IR GENERIC HISTORICAL  03/03/2016   IR IVC FILTER PLMT / S&I [name] GUID/MOD SED 03/03/2016 [name], MD [facility]  . IR GENERIC HISTORICAL  04/09/2016   IR RADIOLOGIST EVAL & MGMT 04/09/2016 [name], MD [facility] INTER[facility] RAD  . IR GENERIC HISTORICAL  04/30/2016   IR IVC FILTER RETRIEVAL / S&I [name] GUID/MOD SED 04/30/2016 [name], MD [facility]-INTER[facility] RAD  . LITHOTRIPSY    . LUMBAR LAMINECTOMY/DECOMPRESSION MICRODISCECTOMY Right 09/14/2012   Procedure:  Right Lumbar three-four, four-five, Lumbar five-Sacral one decompressive laminectomy;  Surgeon: [name], MD;  Location: [place] [facility];  Service: Neurosurgery;  Laterality: Right;  . LUMBAR LAMINECTOMY/DECOMPRESSION MICRODISCECTOMY Left 03/11/2016   Procedure: Left Lumbar Two-ThreeMicrodiscectomy;  Surgeon: [name], MD;  Location: [place];  Service: Neurosurgery;  Laterality: Left;  . PERIPHERAL VASCULAR INTERVENTION  11/18/2016   Procedure: PERIPHERAL VASCULAR INTERVENTION;  Surgeon: [name], MD;  Location: [place] [facility];  Service: Cardiovascular;;  Left popliteal  . SHOULDER SURGERY Left 05/07/2009  . TONSILLECTOMY      Family History  Problem Relation Age of Onset  . Heart disease Mother   . Heart attack Mother   . Stroke Mother   . Heart disease Father   . Heart attack Father   . Stroke Father   . Breast cancer Sister        Twin   . Heart attack Sister   . Dementia Sister   . Heart disease Sister   . Heart attack Sister   . Clotting disorder Sister   . Heart attack Sister   . Colon cancer Neg Hx     Social History   Socioeconomic History  . Marital status: Married    Spouse name: Hassan Rowan  . Number of children: 2  . Years of education: 1-College  . Highest education level: Not on file  Occupational History  . 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 Financial resource strain: Not on file  . Food insecurity    Worry: Not on file    Inability: Not on file  . Transportation needs    Medical: Not on file    Non-medical: Not on file  Tobacco Use  . Smoking status: Never Smoker  . Smokeless tobacco: Never Used  Substance and Sexual Activity  . Alcohol use: No    Alcohol/week: 0.0 standard drinks  . Drug use: No  . Sexual activity: Not Currently  Lifestyle  . Physical activity    Days per week: Not on file    Minutes per session: Not on file  . Stress: Not on file  Relationships  . Social Product manager on phone: Patient refused    Gets together: Patient refused    Attends religious service: Patient refused    Active member of club or organization: Patient refused    Attends meetings of clubs or organizations: Patient refused    Relationship status: Patient refused  . Intimate partner violence    Fear of current or ex partner: Patient refused    Emotionally abused: Patient refused    Physically abused: Patient refused    Forced sexual activity: Patient refused  Other Topics Concern  .  Not on file  Social History Narrative   Lives at home w/ his wife   Patient is right handed.   Patient has 1-2 cups of caffeine daily.    Outpatient Medications Prior to Visit  Medication Sig Dispense Refill  . acetaminophen (TYLENOL) 325 MG tablet Take 1-2 tablets (325-650 mg total) by mouth every 4 (four) hours as needed for mild pain.    . brimonidine-timolol (COMBIGAN) 0.2-0.5 % ophthalmic solution Place 1 drop into both eyes 2 (two) times daily.     . cholecalciferol (VITAMIN D) 1000 units tablet Take 1,000 Units by mouth daily.     . diclofenac sodium (VOLTAREN) 1 % GEL Apply 2 g topically 3 (three) times daily as needed. 50 g 0  . diphenoxylate-atropine (LOMOTIL) 2.5-0.025 MG tablet Take 1 tablet by mouth every 6 (six) hours as needed for diarrhea or loose stools. 90 tablet 1  . ELIQUIS 5 MG TABS tablet TAKE 1 TABLET (5 MG TOTAL) BY MOUTH 2 (TWO) TIMES DAILY. 60 tablet 3  . furosemide (LASIX) 40 MG tablet Take 0.5 tablets (20 mg total) by mouth daily. 45 tablet 3  . gabapentin (NEURONTIN) 100 MG capsule Take one at night for one week and then take one twice daily. 60 capsule 1  . imatinib (GLEEVEC) 100 MG tablet Take 300 mg by mouth daily with lunch. Take with noon meal and a large glass of water.Caution:Chemotherapy    . isosorbide mononitrate (IMDUR) 30 MG 24 hr tablet Take 1 tablet (30 mg total) by mouth daily. 90 tablet 3  . lidocaine (LIDODERM) 5 % Place 1 patch onto the skin daily  as needed (for back pain). Remove & Discard patch within 12 hours or as directed by MD     . loperamide (IMODIUM) 2 MG capsule Take by mouth as needed for diarrhea or loose stools.    . methocarbamol (ROBAXIN) 500 MG tablet Take 500 mg by mouth every 8 (eight) hours as needed for muscle spasms.    . Multiple Vitamin (MULTIVITAMIN) tablet Take 1 tablet by mouth daily.    . potassium chloride (K-DUR) 10 MEQ tablet Take 2 tablets (20 mEq total) by mouth daily. 60 tablet 0  . predniSONE (DELTASONE) 1 MG tablet TAKE 3 TABLETS BY MOUTH ONCE DAILY WITH BREAKFAST 90 tablet 0  . pyridostigmine (MESTINON) 60 MG tablet Take 60 mg by mouth daily.    . rosuvastatin (CRESTOR) 10 MG tablet Take 5 mg by mouth daily.    . tamsulosin (FLOMAX) 0.4 MG CAPS capsule Take 0.4 mg by mouth daily after supper.    . traZODone (DESYREL) 100 MG tablet Take 1 tablet (100 mg total) by mouth at bedtime. 30 tablet 2   No facility-administered medications prior to visit.     Allergies  Allergen Reactions  . Other Other (See Comments)    Regarding scans, the PATIENT IS CLAUSTROPHOBIC!!!  . Phenergan [Promethazine Hcl] Swelling and Other (See Comments)    Cannot be combined with Zofran because swelling of the eyes resulted and patient became very restless-  . Zofran [Ondansetron Hcl] Swelling and Other (See Comments)    Cannot be combined with Phenergan because swelling of the eyes resulted and patient became very restless-  . Sulfamethoxazole Rash and Itching    ROS Review of Systems  Constitutional: Negative.   Respiratory: Negative.   Cardiovascular: Negative.   Gastrointestinal: Negative.   Endocrine: Negative for polyphagia and polyuria.  Musculoskeletal: Positive for gait problem, myalgias, neck pain and neck  stiffness.  Neurological: Positive for weakness and light-headedness.  Hematological: Bruises/bleeds easily.  Psychiatric/Behavioral: Negative.       Objective:    Physical Exam  Constitutional: He  is oriented to person, place, and time. He appears well-developed and well-nourished. No distress.  HENT:  Head: Normocephalic and atraumatic.  Right Ear: External ear normal.  Left Ear: External ear normal.  Eyes: Conjunctivae are normal. Right eye exhibits no discharge. Left eye exhibits no discharge. No scleral icterus.  Neck: No JVD present. No tracheal deviation present.  Cardiovascular: Normal rate, regular rhythm and normal heart sounds.  Pulmonary/Chest: Effort normal and breath sounds normal. No stridor.  Neurological: He is alert and oriented to person, place, and time.  Skin: Skin is warm and dry. He is not diaphoretic.  Psychiatric: He has a normal mood and affect. His behavior is normal.    BP 130/80   Pulse 84   Ht 5\' 6"  (1.676 m)   SpO2 96%   BMI 23.40 kg/m  Wt Readings from Last 3 Encounters:  08/15/18 145 lb (65.8 kg)  07/15/18 145 lb (65.8 kg)  07/05/18 145 lb (65.8 kg)   BP Readings from Last 3 Encounters:  09/01/18 130/80  08/15/18 (!) 141/54  07/15/18 124/61   Guideline developer:  UpToDate (see UpToDate for funding source) Date Released: June 2014  Health Maintenance Due  Topic Date Due  . URINE MICROALBUMIN  07/19/2015  . OPHTHALMOLOGY EXAM  11/25/2017  . FOOT EXAM  04/07/2018  . HEMOGLOBIN A1C  08/31/2018    There are no preventive care reminders to display for this patient.  Lab Results  Component Value Date   TSH 0.51 09/27/2017   Lab Results  Component Value Date   WBC 10.6 (H) 08/15/2018   HGB 12.1 (L) 08/15/2018   HCT 36.4 (L) 08/15/2018   MCV 89.2 08/15/2018   PLT 217 08/15/2018   Lab Results  Component Value Date   NA 136 08/15/2018   K 3.9 08/15/2018   CO2 28 08/15/2018   GLUCOSE 116 (H) 08/15/2018   BUN 20 08/15/2018   CREATININE 1.63 (H) 08/15/2018   BILITOT 0.5 02/11/2018   ALKPHOS 41 02/11/2018   AST 39 02/11/2018   ALT 19 02/11/2018   PROT 4.7 (L) 02/11/2018   ALBUMIN 2.6 (L) 02/11/2018   CALCIUM 9.6 08/15/2018    ANIONGAP 9 08/15/2018   GFR 51.48 (L) 10/06/2017   Lab Results  Component Value Date   CHOL 134 07/20/2017   Lab Results  Component Value Date   HDL 33 (L) 07/20/2017   Lab Results  Component Value Date   LDLCALC 68 07/20/2017   Lab Results  Component Value Date   TRIG 166 (H) 07/20/2017   Lab Results  Component Value Date   CHOLHDL 4.1 07/20/2017   Lab Results  Component Value Date   HGBA1C 5.6 03/02/2018      Assessment & Plan:   Problem List Items Addressed This Visit      Endocrine   Diabetes mellitus type II, controlled ([place]) - Primary   Relevant Orders   Hemoglobin A1c      No orders of the defined types were placed in this encounter.   Follow-up: Return in about 3 months (around 12/02/2018).

## 2018-09-01 NOTE — Patient Instructions (Signed)
Knee to Chest    Pull one knee to chest until a stretch is felt in the lower back and buttock. Repeat on other side. Hold _30_ seconds each side. Repeat _3_ times. Do ___1-2_ sessions per day.  Copyright  VHI. All rights reserved.   Hamstring Stretch, Reclined (Strap, Doorframe)    Lying on your back, use a belt or strap to pull left leg up until you feel a gentle stretch. Hold for 30 sec's. Have someone hold your foot for support once you reach the point of feeling the stretch.   Hold for 30 seconds. Repeat _1-2_ times each leg.  Copyright  VHI. All rights reserved.

## 2018-09-01 NOTE — Therapy (Signed)
[facility] [address] [place], [name], [address] Phone: [phone]   Fax:  [phone]  Physical Therapy Treatment  Patient Details  Name: [name] MRN: [id number] Date of Birth: 01/17/1941 Referring Provider (PT): Kathrynn Ducking, MD   Encounter Date: 09/01/2018   CLINIC OPERATION CHANGES: [facility] is open at lower capacity following universal masking, social distancing, and patient screening.   PT End of Session - 09/01/18 1608    Visit Number  19   reassessment done at visit 17 so 10th visit note at visit 27   Number of Visits  20    Date for PT Re-Evaluation  08/26/18    Authorization Type  Medicare & [employer]    PT Start Time  1602    PT Stop Time  1642    PT Time Calculation (min)  40 min    Equipment Utilized During Treatment  Gait belt    Activity Tolerance  Patient tolerated treatment well;Patient limited by fatigue;Patient limited by pain    Behavior During Therapy  WFL for tasks assessed/performed       Past Medical History:  Diagnosis Date  . Bruises easily    d/t being on Eliquis  . Carotid artery disease ([place])    a. Duplex 05/2014: 67-12% RICA, 4-58% LICA, elevated velocities in right subclavian artery, normal left subclavian artery..  . Chronic back pain    HNP  . Chronic kidney disease (CKD)   . Claustrophobia    takes Ativan as needed  . 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 Coronary atherosclerosis of unspecified type of vessel, native or graft    a. cath in 2003 showed 10-20% LM, scattered 20% prox-mid LAD, 30% more distal LAD, 50-60% stenosis of LAD towards apex, 20% Cx, 30% dRCA, focal 95% stenosis in smaller of 2 branches of PDA, EF 60-65%.  . Diabetes mellitus ([place])   . Diarrhea    takes Imodium daily as needed  . Diverticulosis   . Diverticulosis of colon (without mention of hemorrhage)   . DJD (degenerative joint disease)   . DVT  (deep venous thrombosis) ([place]) 07/2011  . Esophageal stricture   . Essential hypertension    takes Imdur and Lisinopril daily  . Gastric polyps    benign  . GERD (gastroesophageal reflux disease)    takes Nexium daily  . Glaucoma    uses eye drops  . History of bronchitis    not sure when the last time  . History of colon polyps    benign  . History of kidney stones   . History of kidney stones   . Insomnia    takes Melatonin nightly as needed  . Mixed hyperlipidemia    takes Crestor daily  . Myasthenia gravis ([place])    uses prednisone  . Osteoporosis 2016  . Peripheral edema    takes Lasix daily  . Pituitary tumor    checks Dr[facility]Walden at [facility] checks it every Jan   . Pneumonia 01/2016  . PONV (postoperative nausea and vomiting)   . Ptosis   . Pulmonary embolus ([place]) 2012  . Stroke [facility]) 06/2015    Past Surgical History:  Procedure Laterality Date  . ABDOMINAL AORTOGRAM W/LOWER EXTREMITY N/A 11/18/2016   Procedure: ABDOMINAL AORTOGRAM W/LOWER EXTREMITY;  Surgeon: [name], MD;  Location: [facility];  Service: Cardiovascular;  Laterality: N/A;  . AMPUTATION Right 02/07/2018   Procedure:  right leg AMPUTATION ABOVE KNEE;  Surgeon: [name], MD;  Location: [facility];  Service: Vascular;  Laterality: Right;  . AORTOGRAM Right 12/23/2017   Procedure: AORTOGRAM, RIGHT LOWER EXTREMITY ANGIOGRAM, RIGHT SUPERFICIAL FEMORAL ARTERY ANGIOPLASTY WITH STENT, RIGHT POPLITEAL ARTERY ANGIOPLASTY WITH STENT, LEFT GROIN CUTDOWN AND LEFT FEMORAL ARTERY REPAIR;  Surgeon: [name], MD;  Location: [facility];  Service: Vascular;  Laterality: Right;  . BACK SURGERY     x2, lumbar and cervical  . CARDIAC CATHETERIZATION    . CATARACT EXTRACTION Bilateral 12/12  . COLONOSCOPY    . ESOPHAGOGASTRODUODENOSCOPY ENDOSCOPY  04/2015  . IR GENERIC HISTORICAL  03/03/2016   IR IVC FILTER PLMT / S&I [name] GUID/MOD SED 03/03/2016 [name], MD [facility]  . IR GENERIC  HISTORICAL  04/09/2016   IR RADIOLOGIST EVAL & MGMT 04/09/2016 [name], MD [facility] INTER[facility]  . IR GENERIC HISTORICAL  04/30/2016   IR IVC FILTER RETRIEVAL / S&I [name] GUID/MOD SED 04/30/2016 [name], MD [facility]INTER[facility]  . LITHOTRIPSY    . LUMBAR LAMINECTOMY/DECOMPRESSION MICRODISCECTOMY Right 09/14/2012   Procedure: Right Lumbar three-four, four-five, Lumbar five-Sacral one decompressive laminectomy;  Surgeon: [name], MD;  Location: [facility];  Service: Neurosurgery;  Laterality: Right;  . LUMBAR LAMINECTOMY/DECOMPRESSION MICRODISCECTOMY Left 03/11/2016   Procedure: Left Lumbar Two-ThreeMicrodiscectomy;  Surgeon: [name], MD;  Location: [facility];  Service: Neurosurgery;  Laterality: Left;  . PERIPHERAL VASCULAR INTERVENTION  11/18/2016   Procedure: PERIPHERAL VASCULAR INTERVENTION;  Surgeon: [name], MD;  Location: [facility];  Service: Cardiovascular;;  Left popliteal  . SHOULDER SURGERY Left 05/07/2009  . TONSILLECTOMY      There were no vitals filed for this visit.  Subjective Assessment - 09/01/18 1603    Subjective  Dr Ronnald Ramp is wanting to do cervical surgery in the next few weeks. Waiting on results of MRI on Sunday and Nerve Conduction studies on 09/08/18. He'll decide at that time what and where the surgery will be. See's him again on 09/13/18. No falls.    Patient is accompained by:  Family member    Pertinent History   Back surgery X 4; Tonsillectomy; Cataract extraction (Bilateral, 12/12); Shoulder surgery (Left, 05/07/2009); Cardiac catheterization; Lumbar laminectomy/decompression microdiscectomy (Right, 09/14/2012); Esophagogastroduodenoscopy endoscopy (04/2015); ir generic historical (03/03/2016); Colonoscopy; Lithotripsy; Lumbar laminectomy/decompression microdiscectomy (Left, 03/11/2016); ir generic historical (04/09/2016); ir generic historical (04/30/2016); ABDOMINAL AORTOGRAM W/LOWER EXTREMITY (N/A, 11/18/2016); PERIPHERAL VASCULAR INTERVENTION  (11/18/2016); Aortogram (Right, 12/23/2017); and Transfemoral Amputation (Right, 02/07/2018).    Limitations  Lifting;Standing;Walking;House hold activities    Patient Stated Goals  He wants to walk with prosthesis, get around house & back in community. Try golf again.     Currently in Pain?  Yes    Pain Score  3     Pain Location  Neck    Pain Orientation  Left;Right;Posterior    Pain Descriptors / Indicators  Aching;Radiating;Pins and needles;Numbness    Pain Type  Chronic pain    Pain Radiating Towards  bil UEs to the hands, L>R    Pain Onset  More than a month ago    Pain Frequency  Constant    Aggravating Factors   sitting and supine positions    Pain Relieving Factors  medications    Pain Score  4    Pain Location  Back    Pain Orientation  Left;Posterior    Pain Descriptors / Indicators  Aching;Sore;Radiating;Burning    Pain Type  Chronic pain    Pain Radiating Towards  down left LE to knee    Pain Onset  More than a month ago    Pain Frequency  Constant    Aggravating Factors   sitting and standing    Pain Relieving Factors  medication           [facility] Adult PT Treatment/Exercise - 09/01/18 1608      Transfers   Transfers  Sit to Stand;Stand to Sit    Sit to Stand  4: Min assist;With upper extremity assist;With armrests;From bed    Sit to Stand Details  Verbal cues for sequencing;Verbal cues for technique;Verbal cues for safe use of DME/AE    Stand to Sit  4: Min assist;With upper extremity assist;To bed    Stand to Sit Details (indicate cue type and reason)  Verbal cues for sequencing;Verbal cues for technique;Verbal cues for safe use of DME/AE    Squat Pivot Transfers  5: Supervision;With upper extremity assistance    Squat Pivot Transfer Details (indicate cue type and reason)  transport chair to/from mat table       Ambulation/Gait   Ambulation/Gait  Yes      Therapeutic Activites    Therapeutic Activities  Other Therapeutic Activities    Other Therapeutic  Activities  standing with RW support: alternating UE raises for 10 reps each side with min guard assist; with bil UE support head movements left<>right with EO, progressing to with EC. min guard to min assist. pt then needing to sit down due to left hip pain. pt uanble to stand again due to pain/weakness.       Exercises   Exercises  Other Exercises    Other Exercises   passive stretching of left LE: hamstrings, adductors and heel cords for pronlonged holds each for 2-3 reps each; ;taught pt single knee to chest stretch with towel for 30 sec's x 3 reps, then with a belt for hamstring stretch, support needed at heel/foot once he reached his stopping point. added these to his HEP.       Prosthetics   Current prosthetic wear tolerance (days/week)   wearing liner every day    Current prosthetic wear tolerance (#hours/day)   liner only 8-10 hrs    Residual limb condition   intact per pt and spouse report    Education Provided  Residual limb care;Proper Donning;Proper wear schedule/adjustment    Person(s) Educated  Patient;Spouse    Education Method  Explanation;Demonstration;Verbal cues    Education Method  Verbalized understanding;Returned demonstration;Verbal cues required;Needs further instruction    Donning Prosthesis  Moderate assist    Doffing Prosthesis  Minimal assist           PT Short Term Goals - 08/25/18 1020      PT SHORT TERM GOAL #1   Title  Patient able to donne prosthesis including tightening suspension strap with wife's supervision. (All STGs Target Date: 09/23/2018)    Time  4    Period  Weeks    Status  Revised    Target Date  09/23/18      PT SHORT TERM GOAL #2   Title  Patient tolerates wear of prosthetic liner >10hrs total/day & prosthesis >4hrs total /day.    Time  4    Period  Weeks    Status  Revised    Target Date  09/23/18      PT SHORT TERM GOAL #3   Title  Patient tolerates standing for 3  minutes & Standing balance with RW reaches 5" and scans  environment with head & trunk movements safely with no loss of balance.    Time  4    Period  Weeks    Status  On-going    Target Date  09/23/18      PT SHORT TERM GOAL #4   Title  Patient ambulates 39' with RW & prosthesis with MinA.    Time  4    Period  Weeks    Status  Revised    Target Date  09/23/18      PT SHORT TERM GOAL #5   Title  Patient reports left hip pain </= 7/10 with transfers, standing & gait activities.    Time  4    Period  Weeks    Status  New    Target Date  09/23/18        PT Long Term Goals - 08/25/18 1045      PT LONG TERM GOAL #1   Title  Patient & wife verbalize & demonstrate understanding of proper prosthetic care to enable safe use of prosthesis. (All LTGs Target Date: 11/18/2018)    Time  12    Period  Weeks    Status  Revised    Target Date  11/18/18      PT LONG TERM GOAL #2   Title  Patient tolerates wearing prosthetic liner >80% of awake hours & prosthesis >8hr total/day without skin or limb pain issues to enable function throughout his day.    Time  12    Period  Weeks    Status  Revised    Target Date  11/18/18      PT LONG TERM GOAL #3   Title  Patient standing balance with RW support with wife's supervision: reaching 10" anteriorly, managing clothes for toileting & scans environment with no loss of balance.    Time  12    Period  Weeks    Status  Revised    Target Date  11/18/18      PT LONG TERM GOAL #4   Title  Patient ambulates 100' with RW & prosthesis with supervision with wife safely.    Time  12    Period  Weeks    Status  Revised    Target Date  11/18/18      PT LONG TERM GOAL #5   Title  Patient reports back, left leg & left arm pain </= 5/10 with standing & gait activities.    Time  12    Period  Weeks    Status  New    Target Date  11/18/18      PT LONG TERM GOAL #6   Title  Patient demonstrates & verbalizes understanding of ongoing HEP for maintaining strength, flexibility & endurance at functional level     Time  12    Period  Weeks    Status  New    Target Date  11/18/18            Plan - 09/01/18 1607    Clinical Impression Statement  Today's skilled session continued to focus on left LE tightness and pain. Added 2 stretches to pt's HEP. Remainder of session focused on prosthetic management and standing tolerance/balance at RW. Pt limited in standing due to left hip pain, uanble to stand a second time. The pt is slowly progressing toward goals and should benefit from continued PT to progress toward unmet goals.  Personal Factors and Comorbidities  Age;Comorbidity 3+;Fitness;Past/Current Experience    Comorbidities  Back surgery x 4; Tonsillectomy; Cataract extraction (Bilateral, 12/12); Shoulder surgery (Left, 05/07/2009); Cardiac catheterization; Lumbar laminectomy/decompression microdiscectomy (Right, 09/14/2012); Esophagogastroduodenoscopy endoscopy (04/2015); ir generic historical (03/03/2016); Colonoscopy; Lithotripsy; Lumbar laminectomy/decompression microdiscectomy (Left, 03/11/2016); ir generic historical (04/09/2016); ir generic historical (04/30/2016); ABDOMINAL AORTOGRAM W/LOWER EXTREMITY (N/A, 11/18/2016); PERIPHERAL VASCULAR INTERVENTION (11/18/2016); Aortogram (Right, 12/23/2017); and Amputation Transfemoral (Right, 02/07/2018).    Examination-Activity Limitations  Hygiene/Grooming;Lift;Locomotion Level;Reach Overhead;Stairs;Stand;Toileting;Transfers    Examination-Participation Restrictions  Church;Community Activity;Driving    Stability/Clinical Decision Making  Evolving/Moderate complexity    Rehab Potential  Good    PT Frequency  2x / week    PT Duration  12 weeks    PT Treatment/Interventions  ADLs/Self Care Home Management;Stair training;Gait training;DME Instruction;Functional mobility training;Therapeutic activities;Therapeutic exercise;Balance training;Neuromuscular re-education;Patient/family education;Prosthetic Training;Manual techniques;Vestibular;Cryotherapy;Electrical  Stimulation;Moist Heat;Ultrasound    PT Next Visit Plan  modalities & manual technique for back & left hip pain, prosthetic training with RW standing balance & limited gait.    Consulted and Agree with Plan of Care  Patient;Family member/caregiver    Family Member Consulted  wife, Tymarion Everard       Patient will benefit from skilled therapeutic intervention in order to improve the following deficits and impairments:  Abnormal gait, Decreased activity tolerance, Decreased balance, Decreased endurance, Decreased knowledge of use of DME, Decreased mobility, Decreased range of motion, Decreased strength, Impaired flexibility, Prosthetic Dependency, Postural dysfunction  Visit Diagnosis: 1. Muscle weakness (generalized)   2. Abnormal posture   3. Other abnormalities of gait and mobility   4. Unsteadiness on feet   5. Chronic left-sided low back pain without sciatica   6. Pain in left hip        Problem List Patient Active Problem List   Diagnosis Date Noted  . Neuropathic pain of right ankle 06/27/2018  . Insomnia due to medical condition 04/13/2018  . Atherosclerosis of native coronary artery of native heart without angina pectoris   . Hypokalemia   . Stage 3 chronic kidney disease ([place])   . 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 Acute blood loss anemia 02/08/2018  . Post-operative pain   . Unilateral AKA, right ([place])   . Ischemic foot 02/05/2018  . Ischemia of right lower extremity   . Pressure injury of skin 12/27/2017  . Arterial occlusive disease   . Postoperative pain   . CKD (chronic kidney disease), stage III ([place])   . History of CVA (cerebrovascular accident)   . 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 Septic arthritis of left sternoclavicular joint ([place]) 11/03/2017  . Cervical spinal cord compression ([place]) 10/30/2017  . Nasal abscess 09/25/2017  . Immunocompromised ([place]) 09/25/2017  . Acute on chronic blood loss anemia 08/05/2017  . Transient ischemic  attack (TIA) 07/23/2017  . Subcortical infarction ([place])   . Right hemiparesis ([place])   . Ataxia 07/21/2017  . TIA (transient ischemic attack)   . Wheezing 06/17/2017  . Bursitis of left elbow 05/01/2017  . Dystonia 12/29/2016  . PAD (peripheral artery disease) ([place]) 11/18/2016  . Osteoporosis 08/28/2016  . Paronychia of great toe, left 08/10/2016  . Carotid arterial disease ([place]) 07/09/2016  . Hypocalcemia 07/09/2016  . Diplopia 07/02/2016  . S/P lumbar laminectomy 03/11/2016  . Radicular pain of left lower extremity 02/21/2016  . Cough 01/10/2016  . 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 Myasthenia gravis ([place])   . Diabetes mellitus type 2 in nonobese (H[facility])   .  Ataxia, post-stroke   . Gait disturbance, post-stroke   . Proximal leg weakness   . Cervical spondylosis without myelopathy   . Achilles tendinitis of right lower extremity   . Chronic diastolic CHF (congestive heart failure) ([place])   . Gastroesophageal reflux disease without esophagitis   . 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 Cerebellar stroke ([place])   . Ischemic stroke ([place])   . Cerebrovascular accident (CVA) due to thrombosis of left vertebral artery ([place])   . History of pulmonary embolus ([place])   . Chronic anticoagulation   . HLD (hyperlipidemia)   . Neck pain 03/19/2015  . Pain in the chest   . Diabetes mellitus type II, controlled ([place])   . Diabetic gastroparesis ([place]) 03/07/2015  . Screening for osteoporosis 07/19/2014  . Dyspnea 04/09/2014  . Edema 04/09/2014  . Routine general medical examination at a health care facility 10/23/2013  . Bilateral carotid bruits 05/24/2013  . Abnormal CT scan, lung 05/24/2013  . Encounter for therapeutic drug monitoring 03/14/2013  . Lymphadenopathy, inguinal 01/11/2013  . AKI (acute kidney injury) ([place]) 10/28/2012  . 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 Myasthenia gravis with exacerbation ([place]) 03/31/2012   . Disease of salivary gland 12/09/2011  . Left facial pain 10/28/2011  . Pulmonary embolism ([place]) 10/25/2011  . 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 GERD with stricture 09/23/2011  . Long term (current) use of anticoagulants 08/03/2011  . History of pulmonary embolism 07/30/2011  . History of DVT (deep vein thrombosis) 07/30/2011  . Subconjunctival hemorrhage 06/22/2011  . Bilateral conjunctivitis 04/09/2011  . Diabetes mellitus type 2, controlled ([place]) 12/18/2010  . Hearing loss 08/04/2010  . Ptosis of eyelid 12/30/2009  . Essential hypertension 04/24/2009  . HYPERLIPIDEMIA, MIXED 10/05/2008  . HEMORRHOIDS-INTERNAL 07/02/2008  . HEMORRHOIDS-EXTERNAL 07/02/2008  . DIVERTICULOSIS-COLON 07/02/2008  . PERSONAL HX COLONIC POLYPS 07/02/2008  . RASH-NONVESICULAR 02/07/2008  . PNEUMONIA 12/08/2007  . Chronic myeloid leukemia in remission ([place]) 07/07/2007  . CAD (coronary artery disease) 07/07/2007  . NEPHROLITHIASIS, HX OF 07/07/2007    Willow Ora, PTA, [facility] [address] [place], [place] [address] [phone] 09/01/18, 4:54 PM   Name: [name] MRN: [id number] Date of Birth: March 04, 1940

## 2018-09-02 LAB — HEMOGLOBIN A1C
Est. average glucose Bld gHb Est-mCnc: 117 mg/dL
Hgb A1c MFr Bld: 5.7 % — ABNORMAL HIGH (ref 4.8–5.6)

## 2018-09-04 ENCOUNTER — Ambulatory Visit
Admission: RE | Admit: 2018-09-04 | Discharge: 2018-09-04 | Disposition: A | Discharge status: Home / Self Care | Payer: Medicare Other | Source: Ambulatory Visit | Attending: Neurological Surgery | Admitting: Neurological Surgery

## 2018-09-04 ENCOUNTER — Other Ambulatory Visit: Payer: Self-pay

## 2018-09-04 DIAGNOSIS — M48061 Spinal stenosis, lumbar region without neurogenic claudication: Secondary | ICD-10-CM | POA: Diagnosis not present

## 2018-09-04 DIAGNOSIS — M5416 Radiculopathy, lumbar region: Secondary | ICD-10-CM

## 2018-09-05 ENCOUNTER — Encounter: Payer: Self-pay | Admitting: Physical Therapy

## 2018-09-06 ENCOUNTER — Other Ambulatory Visit: Payer: Medicare Other

## 2018-09-06 ENCOUNTER — Telehealth (HOSPITAL_COMMUNITY): Payer: Self-pay | Admitting: Rehabilitation

## 2018-09-06 NOTE — Telephone Encounter (Signed)

## 2018-09-07 ENCOUNTER — Ambulatory Visit (INDEPENDENT_AMBULATORY_CARE_PROVIDER_SITE_OTHER): Payer: Medicare Other | Admitting: Vascular Surgery

## 2018-09-07 ENCOUNTER — Encounter (HOSPITAL_COMMUNITY): Payer: Medicare Other

## 2018-09-07 ENCOUNTER — Other Ambulatory Visit: Payer: Self-pay

## 2018-09-07 ENCOUNTER — Encounter: Payer: Self-pay | Admitting: Vascular Surgery

## 2018-09-07 VITALS — BP 105/56 | HR 68 | Temp 97.4°F | Resp 16 | Ht 66.0 in | Wt 148.0 lb

## 2018-09-07 DIAGNOSIS — I2510 Atherosclerotic heart disease of native coronary artery without angina pectoris: Secondary | ICD-10-CM

## 2018-09-07 DIAGNOSIS — I739 Peripheral vascular disease, unspecified: Secondary | ICD-10-CM | POA: Diagnosis not present

## 2018-09-07 NOTE — Progress Notes (Signed)
Patient name: [name] MRN: [id number] DOB: 08-07-1940 Sex: male  REASON FOR VISIT:   Follow-up of peripheral vascular disease.  HPI:   [name] is a pleasant 78 y.o. 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  He comes in for routine follow-up for his peripheral vascular disease.  He was having some left leg swelling which prompted a venous duplex scan on 08/15/2018 which showed no evidence of DVT.  He has had 2 noninvasive studies done at Dr. Tyrell Antonio office so we did not repeat any studies here today.  He was ambulatory with his prosthesis but now is having some cervical disc issues and also pulled a muscle in his left groin so he is not walking currently.  However he was making good progress with no significant claudication or rest pain in the left foot.  Past Medical History:  Diagnosis Date  . Bruises easily    d/t being on Eliquis  . Carotid artery disease ([place])    a. Duplex 05/2014: 68-08% RICA, 8-11% LICA, elevated velocities in right subclavian artery, normal left subclavian artery..  . Chronic back pain    HNP  . Chronic kidney disease (CKD)   . Claustrophobia    takes Ativan as needed  . 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 Coronary atherosclerosis of unspecified type of vessel, native or graft    a. cath in 2003 showed 10-20% LM, scattered 20% prox-mid LAD, 30% more distal LAD, 50-60% stenosis of LAD towards apex, 20% Cx, 30% dRCA, focal 95% stenosis in smaller of 2 branches of PDA, EF 60-65%.  . Diabetes mellitus ([place])   . Diarrhea    takes Imodium daily as needed  . Diverticulosis   . Diverticulosis of colon (without mention of hemorrhage)   . DJD (degenerative joint disease)   . DVT (deep venous thrombosis) ([place]) 07/2011  . Esophageal stricture   . Essential hypertension    takes Imdur and Lisinopril daily  . Gastric polyps    benign  . GERD (gastroesophageal reflux disease)    takes Nexium daily  .  Glaucoma    uses eye drops  . History of bronchitis    not sure when the last time  . History of colon polyps    benign  . History of kidney stones   . History of kidney stones   . Insomnia    takes Melatonin nightly as needed  . Mixed hyperlipidemia    takes Crestor daily  . Myasthenia gravis ([place])    uses prednisone  . Osteoporosis 2016  . Peripheral edema    takes Lasix daily  . Pituitary tumor    checks [facility]Walden at [facility] checks it every Jan   . Pneumonia 01/2016  . PONV (postoperative nausea and vomiting)   . Ptosis   . Pulmonary embolus ([place]) 2012  . Stroke [facility]) 06/2015    Family History  Problem Relation Age of Onset  . Heart disease Mother   . Heart attack Mother   . Stroke Mother   . Heart disease Father   . Heart attack Father   . Stroke Father   . Breast cancer Sister        Twin   . Heart attack Sister   . Dementia Sister   . Heart disease Sister   . Heart attack Sister   . Clotting disorder Sister   . Heart attack Sister   . Colon cancer Neg  Hx     SOCIAL HISTORY: Social History   Tobacco Use  . Smoking status: Never Smoker  . Smokeless tobacco: Never Used  Substance Use Topics  . Alcohol use: No    Alcohol/week: 0.0 standard drinks    Allergies  Allergen Reactions  . Other Other (See Comments)    Regarding scans, the PATIENT IS CLAUSTROPHOBIC!!!  . Phenergan [Promethazine Hcl] Swelling and Other (See Comments)    Cannot be combined with Zofran because swelling of the eyes resulted and patient became very restless-  . Zofran [Ondansetron Hcl] Swelling and Other (See Comments)    Cannot be combined with Phenergan because swelling of the eyes resulted and patient became very restless-  . Sulfamethoxazole Rash and Itching    Current Outpatient Medications  Medication Sig Dispense Refill  . acetaminophen (TYLENOL) 325 MG tablet Take 1-2 tablets (325-650 mg total) by mouth every 4 (four) hours as needed for mild pain. (Patient not  taking: Reported on 09/07/2018)    . brimonidine-timolol (COMBIGAN) 0.2-0.5 % ophthalmic solution Place 1 drop into both eyes 2 (two) times daily.     . cholecalciferol (VITAMIN D) 1000 units tablet Take 1,000 Units by mouth daily.     . diclofenac sodium (VOLTAREN) 1 % GEL Apply 2 g topically 3 (three) times daily as needed. (Patient not taking: Reported on 09/07/2018) 50 g 0  . diphenoxylate-atropine (LOMOTIL) 2.5-0.025 MG tablet Take 1 tablet by mouth every 6 (six) hours as needed for diarrhea or loose stools. (Patient not taking: Reported on 09/07/2018) 90 tablet 1  . ELIQUIS 5 MG TABS tablet TAKE 1 TABLET (5 MG TOTAL) BY MOUTH 2 (TWO) TIMES DAILY. (Patient not taking: Reported on 09/07/2018) 60 tablet 3  . furosemide (LASIX) 40 MG tablet Take 0.5 tablets (20 mg total) by mouth daily. (Patient not taking: Reported on 09/07/2018) 45 tablet 3  . gabapentin (NEURONTIN) 100 MG capsule Take one at night for one week and then take one twice daily. (Patient not taking: Reported on 09/07/2018) 60 capsule 1  . imatinib (GLEEVEC) 100 MG tablet Take 300 mg by mouth daily with lunch. Take with noon meal and a large glass of water.Caution:Chemotherapy    . isosorbide mononitrate (IMDUR) 30 MG 24 hr tablet Take 1 tablet (30 mg total) by mouth daily. (Patient not taking: Reported on 09/07/2018) 90 tablet 3  . lidocaine (LIDODERM) 5 % Place 1 patch onto the skin daily as needed (for back pain). Remove & Discard patch within 12 hours or as directed by MD     . loperamide (IMODIUM) 2 MG capsule Take by mouth as needed for diarrhea or loose stools.    . methocarbamol (ROBAXIN) 500 MG tablet Take 500 mg by mouth every 8 (eight) hours as needed for muscle spasms.    . Multiple Vitamin (MULTIVITAMIN) tablet Take 1 tablet by mouth daily.    . predniSONE (DELTASONE) 1 MG tablet TAKE 3 TABLETS BY MOUTH ONCE DAILY WITH BREAKFAST (Patient not taking: Reported on 09/07/2018) 90 tablet 0  . pyridostigmine (MESTINON) 60 MG tablet Take  60 mg by mouth daily.    . rosuvastatin (CRESTOR) 10 MG tablet Take 5 mg by mouth daily.    . tamsulosin (FLOMAX) 0.4 MG CAPS capsule Take 0.4 mg by mouth daily after supper.    . traZODone (DESYREL) 100 MG tablet Take 1 tablet (100 mg total) by mouth at bedtime. (Patient not taking: Reported on 09/07/2018) 30 tablet 2   No current facility-administered medications  for this visit.     REVIEW OF SYSTEMS:  [X]  denotes positive finding, [ ]  denotes negative finding Cardiac  Comments:  Chest pain or chest pressure:    Shortness of breath upon exertion:    Short of breath when lying flat:    Irregular heart rhythm:        Vascular    Pain in calf, thigh, or hip brought on by ambulation:    Pain in feet at night that wakes you up from your sleep:     Blood clot in your veins:    Leg swelling:  x       Pulmonary    Oxygen at home:    Productive cough:     Wheezing:         Neurologic    Sudden weakness in arms or legs:     Sudden numbness in arms or legs:     Sudden onset of difficulty speaking or slurred speech:    Temporary loss of vision in one eye:     Problems with dizziness:         Gastrointestinal    Blood in stool:     Vomited blood:         Genitourinary    Burning when urinating:     Blood in urine:        Psychiatric    Major depression:         Hematologic    Bleeding problems:    Problems with blood clotting too easily:        Skin    Rashes or ulcers:        Constitutional    Fever or chills:     PHYSICAL EXAM:   Vitals:   09/07/18 1359  BP: (!) 105/56  Pulse: 68  Resp: 16  Temp: (!) 97.4 F (36.3 C)  TempSrc: Oral  SpO2: 96%  Weight: 148 lb (67.1 kg)  Height: 5\' 6"  (1.676 m)    GENERAL: The patient is a well-nourished male, in no acute distress. The vital signs are documented above. CARDIAC: There is a regular rate and rhythm.  VASCULAR: I do not detect carotid bruits. I cannot palpate pedal pulses on the left.  The foot appears  adequately perfused. There is mild left lower extremity swelling. His right above-the-knee amputation site is healing nicely. PULMONARY: There is good air exchange bilaterally without wheezing or rales. ABDOMEN: Soft and non-tender with normal pitched bowel sounds.  MUSCULOSKELETAL: He has a right above-the-knee amputation. NEUROLOGIC: No focal weakness or paresthesias are detected. SKIN: There are no ulcers or rashes noted. PSYCHIATRIC: The patient has a normal affect.  DATA:    VENOUS DUPLEX: I have reviewed the venous duplex scan that was done on 08/15/2018.  There is no evidence of DVT of the left lower extremity.  MEDICAL ISSUES:   STATUS POST RIGHT ABOVE-THE-KNEE AMPUTATION: His above-the-knee amputation site is healing nicely.  He has been ambulatory with his prosthesis.  Dr. Fletcher Anon is following his peripheral vascular disease in the left lower extremity so I will just see him as needed.  [name] [facility] [phone]

## 2018-09-08 DIAGNOSIS — G5623 Lesion of ulnar nerve, bilateral upper limbs: Secondary | ICD-10-CM | POA: Diagnosis not present

## 2018-09-08 DIAGNOSIS — M4802 Spinal stenosis, cervical region: Secondary | ICD-10-CM | POA: Diagnosis not present

## 2018-09-08 DIAGNOSIS — G5603 Carpal tunnel syndrome, bilateral upper limbs: Secondary | ICD-10-CM | POA: Diagnosis not present

## 2018-09-09 ENCOUNTER — Encounter

## 2018-09-12 ENCOUNTER — Ambulatory Visit: Payer: Medicare Other | Admitting: Physical Therapy

## 2018-09-13 ENCOUNTER — Other Ambulatory Visit: Payer: Self-pay

## 2018-09-13 ENCOUNTER — Encounter: Payer: Self-pay | Admitting: Neurology

## 2018-09-13 ENCOUNTER — Other Ambulatory Visit: Payer: Self-pay | Admitting: Neurological Surgery

## 2018-09-13 ENCOUNTER — Other Ambulatory Visit: Payer: Medicare Other

## 2018-09-13 ENCOUNTER — Ambulatory Visit (INDEPENDENT_AMBULATORY_CARE_PROVIDER_SITE_OTHER): Payer: Medicare Other | Admitting: Neurology

## 2018-09-13 VITALS — BP 139/61 | HR 65 | Temp 98.4°F | Wt 148.0 lb

## 2018-09-13 DIAGNOSIS — G5623 Lesion of ulnar nerve, bilateral upper limbs: Secondary | ICD-10-CM | POA: Diagnosis not present

## 2018-09-13 DIAGNOSIS — I2510 Atherosclerotic heart disease of native coronary artery without angina pectoris: Secondary | ICD-10-CM | POA: Diagnosis not present

## 2018-09-13 DIAGNOSIS — G7000 Myasthenia gravis without (acute) exacerbation: Secondary | ICD-10-CM

## 2018-09-13 DIAGNOSIS — I10 Essential (primary) hypertension: Secondary | ICD-10-CM | POA: Diagnosis not present

## 2018-09-13 DIAGNOSIS — G5603 Carpal tunnel syndrome, bilateral upper limbs: Secondary | ICD-10-CM | POA: Diagnosis not present

## 2018-09-13 MED ORDER — PREDNISONE 1 MG PO TABS: ORAL_TABLET | ORAL | 2 refills | Status: DC

## 2018-09-13 NOTE — Progress Notes (Signed)
Reason for visit: Myasthenia gravis  [name] is an 78 y.o. male  History of present illness:  [name] is an 78 year old right-handed white male with a history of ocular myasthenia gravis.  He has done quite well on low-dose prednisone and Mestinon.  He gets Tristan Mestinon through [facility] hospital.  He has had peripheral vascular disease, he eventually had a right above-knee amputation in December 2019.  He does have a prosthetic limb but he has not yet been able to walk.  He recently began developing numbness and weakness in both hands, left greater than right, he was seen through neurosurgery and underwent MRI of Tristan cervical spine that showed spinal cord compression at Tristan C4-5 level, nerve conduction studies apparently showed evidence of ulnar and median neuropathies, he will be going for surgery for Tristan left ulnar and left median nerve in Tristan near future.  He has very little use of Tristan left hand at this point.  He has not had any problems with his ocular myasthenia, he denies any ptosis or double vision or difficulty with speech or swallowing.  He is on low-dose prednisone.  He was taking 3 mg daily.  Past Medical History:  Diagnosis Date  . Bruises easily    d/t being on Eliquis  . Carotid artery disease ([place])    a. Duplex 05/2014: 29-47% RICA, 6-54% LICA, elevated velocities in right subclavian artery, normal left subclavian artery..  . Chronic back pain    HNP  . Chronic kidney disease (CKD)   . Claustrophobia    takes Ativan as needed  . 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 Coronary atherosclerosis of unspecified type of vessel, native or graft    a. cath in 2003 showed 10-20% LM, scattered 20% prox-mid LAD, 30% more distal LAD, 50-60% stenosis of LAD towards apex, 20% Cx, 30% dRCA, focal 95% stenosis in smaller of 2 branches of PDA, EF 60-65%.  . Diabetes mellitus ([employer])   . Diarrhea    takes Imodium daily as needed  . Diverticulosis   .  Diverticulosis of colon (without mention of hemorrhage)   . DJD (degenerative joint disease)   . DVT (deep venous thrombosis) ([place]) 07/2011  . Esophageal stricture   . Essential hypertension    takes Imdur and Lisinopril daily  . Gastric polyps    benign  . GERD (gastroesophageal reflux disease)    takes Nexium daily  . Glaucoma    uses eye drops  . History of bronchitis    not sure when Tristan last time  . History of colon polyps    benign  . History of kidney stones   . History of kidney stones   . Insomnia    takes Melatonin nightly as needed  . Mixed hyperlipidemia    takes Crestor daily  . Myasthenia gravis ([place])    uses prednisone  . Osteoporosis 2016  . Peripheral edema    takes Lasix daily  . Pituitary tumor    checks Dr[facility]Walden at [facility] checks it every Jan   . Pneumonia 01/2016  . PONV (postoperative nausea and vomiting)   . Ptosis   . Pulmonary embolus ([place]) 2012  . Stroke [facility]) 06/2015    Past Surgical History:  Procedure Laterality Date  . ABDOMINAL AORTOGRAM W/LOWER EXTREMITY N/A 11/18/2016   Procedure: ABDOMINAL AORTOGRAM W/LOWER EXTREMITY;  Surgeon: [name], MD;  Location: [facility];  Service: Cardiovascular;  Laterality: N/A;  . AMPUTATION Right 02/07/2018   Procedure: right leg AMPUTATION ABOVE KNEE;  Surgeon: [name], MD;  Location: [facility];  Service: Vascular;  Laterality: Right;  . AORTOGRAM Right 12/23/2017   Procedure: AORTOGRAM, RIGHT LOWER EXTREMITY ANGIOGRAM, RIGHT SUPERFICIAL FEMORAL ARTERY ANGIOPLASTY WITH STENT, RIGHT POPLITEAL ARTERY ANGIOPLASTY WITH STENT, LEFT GROIN CUTDOWN AND LEFT FEMORAL ARTERY REPAIR;  Surgeon: [name], MD;  Location: [facility];  Service: Vascular;  Laterality: Right;  . BACK SURGERY     x2, lumbar and cervical  . CARDIAC CATHETERIZATION    . CATARACT EXTRACTION Bilateral 12/12  . COLONOSCOPY    . ESOPHAGOGASTRODUODENOSCOPY ENDOSCOPY  04/2015  . IR GENERIC HISTORICAL  03/03/2016    IR IVC FILTER PLMT / S&I [name] GUID/MOD SED 03/03/2016 [name], MD [facility]INTER[facility] RAD  . IR GENERIC HISTORICAL  04/09/2016   IR RADIOLOGIST EVAL & MGMT 04/09/2016 [name], MD [facility] INTER[facility] RAD  . IR GENERIC HISTORICAL  04/30/2016   IR IVC FILTER RETRIEVAL / S&I [name] GUID/MOD SED 04/30/2016 [name], MD [facility]-INTERV RAD  . LITHOTRIPSY    . LUMBAR LAMINECTOMY/DECOMPRESSION MICRODISCECTOMY Right 09/14/2012   Procedure: Right Lumbar three-four, four-five, Lumbar five-Sacral one decompressive laminectomy;  Surgeon: [name], MD;  Location: [facility];  Service: Neurosurgery;  Laterality: Right;  . LUMBAR LAMINECTOMY/DECOMPRESSION MICRODISCECTOMY Left 03/11/2016   Procedure: Left Lumbar Two-ThreeMicrodiscectomy;  Surgeon: [name], MD;  Location: [facility];  Service: Neurosurgery;  Laterality: Left;  . PERIPHERAL VASCULAR INTERVENTION  11/18/2016   Procedure: PERIPHERAL VASCULAR INTERVENTION;  Surgeon: [name], MD;  Location: [facility];  Service: Cardiovascular;;  Left popliteal  . SHOULDER SURGERY Left 05/07/2009  . TONSILLECTOMY      Family History  Problem Relation Age of Onset  . Heart disease Mother   . Heart attack Mother   . Stroke Mother   . Heart disease Father   . Heart attack Father   . Stroke Father   . Breast cancer Sister        Twin   . Heart attack Sister   . Dementia Sister   . Heart disease Sister   . Heart attack Sister   . Clotting disorder Sister   . Heart attack Sister   . Colon cancer Neg Hx     Social history:  reports that he has never smoked. He has never used smokeless tobacco. He reports that he does not drink alcohol or use drugs.    Allergies  Allergen Reactions  . Other Other (See Comments)    Regarding scans, [name] IS CLAUSTROPHOBIC!!!  . Phenergan [Promethazine Hcl] Swelling and Other (See Comments)    Cannot be combined with Zofran because swelling of Tristan eyes resulted and [name] became very restless-  . Zofran  [Ondansetron Hcl] Swelling and Other (See Comments)    Cannot be combined with Phenergan because swelling of Tristan eyes resulted and [name] became very restless-  . Sulfamethoxazole Rash and Itching    Medications:  Prior to Admission medications   Medication Sig Start Date End Date Taking? Authorizing Provider  acetaminophen (TYLENOL) 325 MG tablet Take 1-2 tablets (325-650 mg total) by mouth every 4 (four) hours as needed for mild pain. 02/16/18  Yes Love, Ivan Anchors, PA-C  brimonidine-timolol (COMBIGAN) 0.2-0.5 % ophthalmic solution Place 1 drop into both eyes 2 (two) times daily.    Yes [provider]  cholecalciferol (VITAMIN D) 1000 units tablet Take 1,000 Units by  mouth daily.    Yes [provider]  diclofenac sodium (VOLTAREN) 1 % GEL Apply 2 g topically 3 (three) times daily as needed. 08/15/18  Yes Quintella Reichert, MD  diphenoxylate-atropine (LOMOTIL) 2.5-0.025 MG tablet Take 1 tablet by mouth every 6 (six) hours as needed for diarrhea or loose stools. 05/19/18  Yes Armbruster, Carlota Raspberry, MD  ELIQUIS 5 MG TABS tablet TAKE 1 TABLET (5 MG TOTAL) BY MOUTH 2 (TWO) TIMES DAILY. 12/09/15  Yes Kathrynn Ducking, MD  furosemide (LASIX) 40 MG tablet Take 0.5 tablets (20 mg total) by mouth daily. 02/18/18 02/13/19 Yes Love, Ivan Anchors, PA-C  gabapentin (NEURONTIN) 100 MG capsule Take one at night for one week and then take one twice daily. 06/27/18  Yes Libby Maw, MD  imatinib (GLEEVEC) 100 MG tablet Take 300 mg by mouth daily with lunch. Take with noon meal and a large glass of water.Caution:Chemotherapy   Yes [provider]  isosorbide mononitrate (IMDUR) 30 MG 24 hr tablet Take 1 tablet (30 mg total) by mouth daily. 09/17/15  Yes Sherren Mocha, MD  lidocaine (LIDODERM) 5 % Place 1 patch onto Tristan skin daily as needed (for back pain). Remove & Discard patch within 12 hours or as directed by MD    Yes [provider]  loperamide (IMODIUM) 2 MG capsule Take by  mouth as needed for diarrhea or loose stools.   Yes [provider]  methocarbamol (ROBAXIN) 500 MG tablet Take 500 mg by mouth every 8 (eight) hours as needed for muscle spasms.   Yes Latanya Maudlin, MD  Multiple Vitamin (MULTIVITAMIN) tablet Take 1 tablet by mouth daily.   Yes [provider]  predniSONE (DELTASONE) 1 MG tablet Two tablets daily for 2 months then take one tablet daily for 2 months, then stop 09/13/18  Yes Kathrynn Ducking, MD  pyridostigmine (MESTINON) 60 MG tablet Take 60 mg by mouth daily.   Yes [provider]  rosuvastatin (CRESTOR) 10 MG tablet Take 5 mg by mouth daily.   Yes [provider]  tamsulosin (FLOMAX) 0.4 MG CAPS capsule Take 0.4 mg by mouth daily after supper.   Yes [provider]  traZODone (DESYREL) 100 MG tablet Take 1 tablet (100 mg total) by mouth at bedtime. 06/24/18  Yes Bayard Hugger, NP    ROS:  Out of a complete 14 system review of symptoms, [name] complains only of Tristan following symptoms, and all other reviewed systems are negative.  Hand numbness and weakness Walking problem Weakness  Blood pressure 139/61, pulse 65, temperature 98.4 F (36.9 C), temperature source Temporal, weight 148 lb (67.1 kg).  Physical Exam  General: [name] is alert and cooperative at Tristan time of Tristan examination.  Skin: [name] has an above-Tristan-knee amputation on Tristan right, 1+ edema at Tristan left ankle.   Neurologic Exam  Mental status: [name] is alert and oriented x 3 at Tristan time of Tristan examination. [name] has apparent normal recent and remote memory, with an apparently normal attention span and concentration ability.   Cranial nerves: Facial symmetry is present. Speech is normal, no aphasia or dysarthria is noted. Extraocular movements are full. Visual fields are full.  Facial strength and jaw strength are normal.  Motor: [name] has good strength in all 4 extremities, with exception of  weakness of intrinsic muscles of Tristan hands bilaterally, left greater than right, [name] has some weakness with distal flexors of Tristan fourth and  fifth fingers on Tristan left hand..  Sensory examination: Soft touch sensation is symmetric on Tristan face, arms, and legs.  Coordination: [name] has good finger-nose-finger bilaterally.  Gait and station: [name] is in a wheelchair, he is unable to ambulate.  Reflexes: Deep tendon reflexes are symmetric, but are depressed throughout.   MRI cervical 08/27/18:  IMPRESSION: Similar appearance to Tristan study of last year.  C1-2: Arthropathy but without apparent neural compression.  C2-3: Spondylosis and facet arthropathy worse on Tristan left. Left foraminal narrowing could affect Tristan C3 nerve.  C3-4: Endplate osteophytes and shallow disc herniation more prominent towards Tristan left. Facet arthropathy. Canal diameter 8.5 mm in Tristan midline. Bilateral foraminal stenosis could compress either C4 nerve.  C4-5: Large disc herniation with caudal migration down behind C5. Severe spinal stenosis with canal narrowing and AP diameter as narrow as 4.5 mm. Cord deformity without clear T2 signal. None Tristan less, [name] would be at considerable risk of myelopathy based on this finding. Bilateral facet arthropathy. Bilateral foraminal stenosis likely to compress Tristan C5 nerves.  C5-6: Disc bulge. Facet degeneration. Mild bilateral foraminal narrowing.  C6-7: Left-sided predominant spondylosis and facet arthropathy. Left foraminal narrowing could affect Tristan C7 nerve.  C7-T1: Facet arthropathy with 3 mm of anterolisthesis. Disc herniation with upward migration. Canal narrowing but no cord compression. Bilateral foraminal narrowing could affect either C8 nerve.  T1-2: Right foraminal stenosis because of osteophyte in disc material could affect Tristan right T1 nerve.  * MRI scan images were reviewed online. I agree with Tristan written report.     Assessment/Plan:  1.  Ocular myasthenia gravis  2.  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  Right above-knee amputation  4.  Gait disorder  [name] has developed significant multiple medical issues since last seen.  He has not been seen in about 2 years.  We will continue Tristan Mestinon, we will continue Tristan prednisone taper going to 2 mg daily for 2 months and then go to 1 mg daily for 2 months and then stop Tristan medication.  He has not had any symptoms from Tristan ocular myasthenia gravis.  He will follow-up in 6 months.  Jill Alexanders MD 09/13/2018 1:41 PM  [facility] [address] [place],  [address]  Phone [phone] Fax [phone]

## 2018-09-14 ENCOUNTER — Telehealth: Payer: Self-pay

## 2018-09-14 NOTE — Telephone Encounter (Signed)
   [facility] Pre-operative Risk Assessment    Request for surgical clearance:  1. What type of surgery is being performed? Left Ulnar Nerve Release, Left Carpal Tunnel Release   2. When is this surgery scheduled? 09/23/18   3. What type of clearance is required (medical clearance vs. Pharmacy clearance to hold med vs. Both)? Both  4. Are there any medications that need to be held prior to surgery and how long? Eliquis   5. Practice name and name of physician performing surgery? [facility]/Dr. [name]   6. What is your office phone number [phone]   7.   What is your office fax number [phone]  8.   Anesthesia type (None, local, MAC, general) ? General   Mady Haagensen 09/14/2018, 4:27 PM  _________________________________________________________________   (provider comments below)

## 2018-09-15 ENCOUNTER — Ambulatory Visit: Payer: Medicare Other | Admitting: Physical Therapy

## 2018-09-15 ENCOUNTER — Encounter: Payer: Medicare Other | Admitting: Physical Therapy

## 2018-09-15 NOTE — Telephone Encounter (Signed)
   Primary Cardiologist: Sherren Mocha, MD  Chart reviewed as part of pre-operative protocol coverage. Patient was contacted 09/15/2018 in reference to pre-operative risk assessment for pending surgery as outlined below.  [name] was last seen on 07/15/2018 by Tristan Dopp, PA-C.  Since that day, [name] has done well from a cardiac standpoint. He denies any chest pain, SOB, DOE, or palpitations. He is wheelchair bound, therefore activity is somewhat limited. He is able to transition himself from wheelchair to bed/chair/toilet/etc and denies any anginal complaints with these activities.   Therefore, based on ACC/AHA guidelines, the patient would be at acceptable risk for the planned procedure without further cardiovascular testing.   Patient is on eliquis for history of DVT/PE. Patient states he was instructed by [name] to hold eliquis for 3 days prior to his upcoming surgery. He should restart this medication when cleared to do so by [name].   I will route this recommendation to the requesting party via Epic fax function and remove from pre-op pool.  Please call with questions.  Abigail Butts, PA-C 09/15/2018, 7:57 AM

## 2018-09-19 ENCOUNTER — Encounter: Payer: Medicare Other | Admitting: Physical Therapy

## 2018-09-20 ENCOUNTER — Encounter (HOSPITAL_COMMUNITY): Payer: Self-pay

## 2018-09-20 ENCOUNTER — Ambulatory Visit: Payer: Medicare Other | Admitting: Physical Therapy

## 2018-09-20 ENCOUNTER — Encounter (HOSPITAL_COMMUNITY)
Admission: RE | Admit: 2018-09-20 | Discharge: 2018-09-20 | Disposition: A | Discharge status: Home / Self Care | Payer: Medicare Other | Source: Ambulatory Visit | Attending: Neurological Surgery | Admitting: Neurological Surgery

## 2018-09-20 ENCOUNTER — Other Ambulatory Visit (HOSPITAL_COMMUNITY)
Admission: RE | Admit: 2018-09-20 | Discharge: 2018-09-20 | Disposition: A | Discharge status: Home / Self Care | Payer: Medicare Other | Source: Ambulatory Visit | Attending: Neurological Surgery | Admitting: Neurological Surgery

## 2018-09-20 ENCOUNTER — Other Ambulatory Visit: Payer: Self-pay

## 2018-09-20 DIAGNOSIS — I509 Heart failure, unspecified: Secondary | ICD-10-CM | POA: Diagnosis not present

## 2018-09-20 DIAGNOSIS — Z86711 Personal history of pulmonary embolism: Secondary | ICD-10-CM | POA: Diagnosis not present

## 2018-09-20 DIAGNOSIS — Z7901 Long term (current) use of anticoagulants: Secondary | ICD-10-CM | POA: Diagnosis not present

## 2018-09-20 DIAGNOSIS — G8929 Other chronic pain: Secondary | ICD-10-CM | POA: Diagnosis not present

## 2018-09-20 DIAGNOSIS — E1151 Type 2 diabetes mellitus with diabetic peripheral angiopathy without gangrene: Secondary | ICD-10-CM | POA: Diagnosis not present

## 2018-09-20 DIAGNOSIS — C9210 Chronic myeloid leukemia, BCR/ABL-positive, not having achieved remission: Secondary | ICD-10-CM | POA: Diagnosis not present

## 2018-09-20 DIAGNOSIS — Z20828 Contact with and (suspected) exposure to other viral communicable diseases: Secondary | ICD-10-CM | POA: Diagnosis not present

## 2018-09-20 DIAGNOSIS — I129 Hypertensive chronic kidney disease with stage 1 through stage 4 chronic kidney disease, or unspecified chronic kidney disease: Secondary | ICD-10-CM | POA: Diagnosis not present

## 2018-09-20 DIAGNOSIS — M1990 Unspecified osteoarthritis, unspecified site: Secondary | ICD-10-CM | POA: Diagnosis not present

## 2018-09-20 DIAGNOSIS — E1122 Type 2 diabetes mellitus with diabetic chronic kidney disease: Secondary | ICD-10-CM | POA: Diagnosis not present

## 2018-09-20 DIAGNOSIS — Z8673 Personal history of transient ischemic attack (TIA), and cerebral infarction without residual deficits: Secondary | ICD-10-CM | POA: Diagnosis not present

## 2018-09-20 DIAGNOSIS — Z79899 Other long term (current) drug therapy: Secondary | ICD-10-CM | POA: Diagnosis not present

## 2018-09-20 DIAGNOSIS — E1140 Type 2 diabetes mellitus with diabetic neuropathy, unspecified: Secondary | ICD-10-CM | POA: Diagnosis not present

## 2018-09-20 DIAGNOSIS — K219 Gastro-esophageal reflux disease without esophagitis: Secondary | ICD-10-CM | POA: Diagnosis not present

## 2018-09-20 DIAGNOSIS — G5622 Lesion of ulnar nerve, left upper limb: Secondary | ICD-10-CM | POA: Diagnosis not present

## 2018-09-20 DIAGNOSIS — E1139 Type 2 diabetes mellitus with other diabetic ophthalmic complication: Secondary | ICD-10-CM | POA: Diagnosis not present

## 2018-09-20 DIAGNOSIS — I2510 Atherosclerotic heart disease of native coronary artery without angina pectoris: Secondary | ICD-10-CM | POA: Diagnosis not present

## 2018-09-20 DIAGNOSIS — H42 Glaucoma in diseases classified elsewhere: Secondary | ICD-10-CM | POA: Diagnosis not present

## 2018-09-20 DIAGNOSIS — N189 Chronic kidney disease, unspecified: Secondary | ICD-10-CM | POA: Diagnosis not present

## 2018-09-20 DIAGNOSIS — G4700 Insomnia, unspecified: Secondary | ICD-10-CM | POA: Diagnosis not present

## 2018-09-20 DIAGNOSIS — E782 Mixed hyperlipidemia: Secondary | ICD-10-CM | POA: Diagnosis not present

## 2018-09-20 DIAGNOSIS — M4802 Spinal stenosis, cervical region: Secondary | ICD-10-CM | POA: Diagnosis not present

## 2018-09-20 DIAGNOSIS — G7000 Myasthenia gravis without (acute) exacerbation: Secondary | ICD-10-CM | POA: Diagnosis not present

## 2018-09-20 DIAGNOSIS — I110 Hypertensive heart disease with heart failure: Secondary | ICD-10-CM | POA: Diagnosis not present

## 2018-09-20 LAB — BASIC METABOLIC PANEL
Anion gap: 10 (ref 5–15)
BUN: 14 mg/dL (ref 8–23)
CO2: 28 mmol/L (ref 22–32)
Calcium: 9.2 mg/dL (ref 8.9–10.3)
Chloride: 102 mmol/L (ref 98–111)
Creatinine, Ser: 1.79 mg/dL — ABNORMAL HIGH (ref 0.61–1.24)
GFR calc Af Amer: 41 mL/min — ABNORMAL LOW (ref >60)
GFR calc non Af Amer: 35 mL/min — ABNORMAL LOW (ref >60)
Glucose, Bld: 114 mg/dL — ABNORMAL HIGH (ref 70–99)
Potassium: 4 mmol/L (ref 3.5–5.1)
Sodium: 140 mmol/L (ref 135–145)

## 2018-09-20 LAB — CBC
HCT: 33.5 % — ABNORMAL LOW (ref 39.0–52.0)
Hemoglobin: 10.9 g/dL — ABNORMAL LOW (ref 13.0–17.0)
MCH: 29 pg (ref 26.0–34.0)
MCHC: 32.5 g/dL (ref 30.0–36.0)
MCV: 89.1 fL (ref 80.0–100.0)
Platelets: 206 10*3/uL (ref 150–400)
RBC: 3.76 MIL/uL — ABNORMAL LOW (ref 4.22–5.81)
RDW: 14 % (ref 11.5–15.5)
WBC: 6.4 10*3/uL (ref 4.0–10.5)
nRBC: 0 % (ref 0.0–0.2)

## 2018-09-20 LAB — SARS CORONAVIRUS 2 (TAT 6-24 HRS): SARS Coronavirus 2: NEGATIVE

## 2018-09-20 NOTE — Pre-Procedure Instructions (Signed)
Niam Nepomuceno Andrew  09/20/2018      [facility], [place] [address] [name] [address] Phone: [phone] Fax: [phone]  [id number] - [place], [place] [address] [place] [address] Phone: [phone] Fax: [phone]   Your procedure is scheduled on Friday, September 23, 2018  Report to [place] at 5:30 A.M.  Call this number if you have problems the morning of surgery:  [phone]   Remember: Brush your teeth the morning of surgery with your regular toothpaste.  Do not eat or drink after midnight Thursday, September 22, 2018     Take these medicines the morning of surgery with A SIP OF WATER:  gabapentin (NEURONTIN),   predniSONE (DELTASONE),  rosuvastatin (CRESTOR),   pyridostigmine (MESTINON),  isosorbide mononitrate (IMDUR),  Combigan  eye drops, If needed: acetaminophen (TYLENOL) for pain Stop taking Aspirin (unless otherwise advised by surgeon), vitamins, fish oil and herbal medications. Do not take any NSAIDs ie: Ibuprofen, Advil, Naproxen (Aleve), Motrin, diclofenac sodium (VOLTAREN),  BC and Goody Powder; stop now.   Follow surgeon's instructions regarding Eliquis, if no pre-op instructions were provided, call surgeon's office.    How to Manage Your Diabetes Before and After Surgery  Why is it important to control my blood sugar before and after surgery? . Improving blood sugar levels before and after surgery helps healing and can limit problems. . A way of improving blood sugar control is eating a healthy diet by: o  Eating less sugar and carbohydrates o  Increasing activity/exercise o  Talking with your doctor about reaching your blood sugar goals . High blood sugars (greater than 180 mg/dL) can raise your risk of infections and slow your recovery, so you will need to focus on controlling your diabetes during the weeks before surgery. .  Make sure that the doctor who takes care of your diabetes knows about your planned surgery including the date and location.  How do I manage my blood sugar before surgery? . Check your blood sugar at least 4 times a day, starting 2 days before surgery, to make sure that the level is not too high or low. o Check your blood sugar the morning of your surgery when you wake up and every 2 hours until you get to the Short Stay unit. . If your blood sugar is less than 70 mg/dL, you will need to treat for low blood sugar: o Treat a low blood sugar (less than 70 mg/dL) with  cup of clear juice (cranberry or apple), 4 glucose tablets, OR glucose gel. Recheck blood sugar in 15 minutes after treatment (to make sure it is greater than 70 mg/dL). If your blood sugar is not greater than 70 mg/dL on recheck, call [phone] o  for further instructions. . Report your blood sugar to the short stay nurse when you get to Short Stay.  . If you are admitted to the hospital after surgery: o Your blood sugar will be checked by the staff and you will probably be given insulin after surgery (instead of oral diabetes medicines) to make sure you have good blood sugar levels. o The goal for blood sugar control after surgery is 80-180 mg/dL.  WHAT DO I DO ABOUT MY DIABETES MEDICATION? N/A  Reviewed and Endorsed by [facility] Patient Education Committee, August 2015  Do not wear jewelry, make-up or nail polish.  Do not wear lotions, powders, or perfumes, or deodorant.  Do not shave 48 hours prior to surgery.  Men may shave face and neck.  Do not bring valuables to the hospital.  [facility] is not responsible for any belongings or valuables.  Contacts, dentures or bridgework may not be worn into surgery.  Leave your suitcase in the car.  After surgery it may be brought to your room.  For patients admitted to the hospital, discharge time will be determined by your treatment team.  Patients discharged the day of  surgery will not be allowed to drive home.   Special instructions: Shower the night before and morning of surgery with CHG; see " [facility] Preparing For Surgery " sheet.  Please read over the following fact sheets that you were given. Pain Booklet, Coughing and Deep Breathing and Surgical Site Infection Prevention

## 2018-09-20 NOTE — Progress Notes (Signed)
PCP - Dr. Mortimer Fries Kermer Cardiologist - Dr. Sherren Mocha  Chest x-ray - denies EKG - 08/15/2018 Stress Test - 09/27/15 ECHO - 07/20/17 Cardiac Cath - denies  Sleep Study - denies CPAP - N/A  Fasting Blood Sugar - Per patient no longer on oral diabetes medicine for the past 4-5 years, PCP manages   A1C: 5.7 on 09/01/2018  Blood Thinner Instructions: Eliquis: LD 09/19/2018 Aspirin Instructions: N/A  Anesthesia review: YES, heart hx  Coronavirus Screening  Have you experienced the following symptoms:  Cough yes/no: No Fever (>100.33F)  yes/no: No Runny nose yes/no: No Sore throat yes/no: No Difficulty breathing/shortness of breath  yes/no: No  Have you or a family member traveled in the last 14 days and where? yes/no: No  If the patient indicates "YES" to the above questions, their PAT will be rescheduled to limit the exposure to others and, the surgeon will be notified. THE PATIENT WILL NEED TO BE ASYMPTOMATIC FOR 14 DAYS.   If the patient is not experiencing any of these symptoms, the PAT nurse will instruct them to NOT bring anyone with them to their appointment since they may have these symptoms or traveled as well.   Please remind your patients and families that hospital visitation restrictions are in effect and the importance of the restrictions.   Patient denies shortness of breath, fever, cough and chest pain at PAT appointment  Patient verbalized understanding of instructions that were given to them at the PAT appointment. Patient was also instructed that they will need to review over the PAT instructions again at home before surgery.

## 2018-09-21 ENCOUNTER — Encounter: Payer: Medicare Other | Admitting: Physical Therapy

## 2018-09-21 NOTE — Anesthesia Preprocedure Evaluation (Deleted)
Anesthesia Evaluation    Airway        Dental   Pulmonary           Cardiovascular hypertension,      Neuro/Psych    GI/Hepatic   Endo/Other  diabetes  Renal/GU      Musculoskeletal   Abdominal   Peds  Hematology   Anesthesia Other Findings   Reproductive/Obstetrics                                                              Anesthesia Evaluation  Patient identified by MRN, date of birth, ID band Patient awake    Reviewed: Allergy & Precautions, NPO status , Patient's Chart, lab work & pertinent test results  History of Anesthesia Complications (+) PONV  Airway Mallampati: I  TM Distance: >3 FB Neck ROM: Full    Dental no notable dental hx. (+) Poor Dentition, Dental Advisory Given   Pulmonary    Pulmonary exam normal breath sounds clear to auscultation       Cardiovascular hypertension, Pt. on medications + CAD  Normal cardiovascular exam Rhythm:Regular Rate:Normal     Neuro/Psych Anxiety  Neuromuscular disease CVA    GI/Hepatic GERD  Medicated and Controlled,  Endo/Other  diabetes  Renal/GU      Musculoskeletal   Abdominal   Peds  Hematology   Anesthesia Other Findings   Reproductive/Obstetrics                            Anesthesia Physical  Anesthesia Plan  ASA: III  Anesthesia Plan: General   Post-op Pain Management:    Induction:   PONV Risk Score and Plan: 3 and Ondansetron, Midazolam, Treatment may vary due to age or medical condition and Dexamethasone  Airway Management Planned: LMA  Additional Equipment:   Intra-op Plan:   Post-operative Plan: Extubation in OR  Informed Consent: I have reviewed the patients History and Physical, chart, labs and discussed the procedure including the risks, benefits and alternatives for the proposed anesthesia with the patient or authorized representative who has  indicated his/her understanding and acceptance.     Plan Discussed with: CRNA and Surgeon  Anesthesia Plan Comments:         Anesthesia Quick Evaluation  Anesthesia Physical Anesthesia Plan  ASA:   Anesthesia Plan:    Post-op Pain Management:    Induction:   PONV Risk Score and Plan:   Airway Management Planned:   Additional Equipment:   Intra-op Plan:   Post-operative Plan:   Informed Consent:   Plan Discussed with:   Anesthesia Plan Comments:       Anesthesia Quick Evaluation

## 2018-09-22 NOTE — Anesthesia Preprocedure Evaluation (Addendum)
Anesthesia Evaluation  Patient identified by MRN, date of birth, ID band Patient awake    Reviewed: Allergy & Precautions, NPO status , Patient's Chart, lab work & pertinent test results  History of Anesthesia Complications (+) PONV and history of anesthetic complications  Airway Mallampati: I  TM Distance: >3 FB Neck ROM: Full    Dental  (+) Poor Dentition, Dental Advisory Given   Pulmonary shortness of breath, pneumonia,    Pulmonary exam normal breath sounds clear to auscultation       Cardiovascular hypertension, Pt. on medications + CAD, + Peripheral Vascular Disease and +CHF  Normal cardiovascular exam Rhythm:Regular Rate:Normal     Neuro/Psych Anxiety TIA Neuromuscular disease CVA    GI/Hepatic GERD  Medicated and Controlled,  Endo/Other  diabetes  Renal/GU Renal disease     Musculoskeletal  (+) Arthritis ,   Abdominal   Peds  Hematology  (+) anemia ,   Anesthesia Other Findings   Reproductive/Obstetrics                            Anesthesia Physical  Anesthesia Plan  ASA: III  Anesthesia Plan: General   Post-op Pain Management:    Induction:   PONV Risk Score and Plan: 3 and Ondansetron, Midazolam, Treatment may vary due to age or medical condition and Dexamethasone  Airway Management Planned: LMA  Additional Equipment:   Intra-op Plan:   Post-operative Plan: Extubation in OR  Informed Consent: I have reviewed the patients History and Physical, chart, labs and discussed the procedure including the risks, benefits and alternatives for the proposed anesthesia with the patient or authorized representative who has indicated his/her understanding and acceptance.       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 Cardiac clearance per telephone encounter 09/15/18 "Jaxxson Cavanah Viana was last seen on 07/15/2018 by Richardson Dopp, PA-C.  Since that day, ALDWIN MICALIZZI has done well from a cardiac standpoint. He denies any chest pain, SOB, DOE, or palpitations. He is wheelchair bound, therefore activity is somewhat limited. He is able to transition himself from wheelchair to bed/chair/toilet/etc and denies any anginal complaints with these activities. Therefore, based on ACC/AHA guidelines, the patient would be at acceptable risk for the planned procedure without further cardiovascular testing. Patient is on eliquis for history of DVT/PE. Patient states he was instructed by Dr. Ronnald Ramp to hold eliquis for 3 days prior to his upcoming surgery. He should restart this medication when cleared to do so by Dr. 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  Stable CKD on PAT labs.   Echo 07/20/2017 Mild concentric LVH, EF 50-55, normal wall motion, grade 1 diastolic dysfunction, mild aortic stenosis (mean 7 mmHg), trivial AI, mild MR, normal RVSF  Carotid US 02/26/2017 Final Interpretation: Right Carotid: Velocities in the right ICA are consistent with a 40-59% stenosis. Left Carotid: Velocities in the left ICA are consistent with a 1-39% stenosis. Vertebrals: Right vertebral artery was patent with antegrade flow. No evidenceof flow detected in the left vertebral artery. Subclavians: Normal flow hemodynamics were seen in bilateral subclavianarteries.  Nuclear stress test 09/27/2015 Low risk stress nuclear study with normal perfusion  and normal left ventricular regional and global systolic function )       Anesthesia Quick Evaluation

## 2018-09-23 ENCOUNTER — Ambulatory Visit (HOSPITAL_COMMUNITY): Payer: Medicare Other | Admitting: Physician Assistant

## 2018-09-23 ENCOUNTER — Encounter (HOSPITAL_COMMUNITY)
Admission: RE | Disposition: A | Discharge status: Home / Self Care | Payer: Self-pay | Source: Home / Self Care | Attending: Neurological Surgery

## 2018-09-23 ENCOUNTER — Ambulatory Visit (HOSPITAL_COMMUNITY): Payer: Medicare Other | Admitting: Certified Registered Nurse Anesthetist

## 2018-09-23 ENCOUNTER — Ambulatory Visit (HOSPITAL_COMMUNITY)
Admission: RE | Admit: 2018-09-23 | Discharge: 2018-09-23 | Disposition: A | Discharge status: Home / Self Care | Payer: Medicare Other | Attending: Neurological Surgery | Admitting: Neurological Surgery

## 2018-09-23 ENCOUNTER — Other Ambulatory Visit: Payer: Self-pay

## 2018-09-23 ENCOUNTER — Ambulatory Visit: Payer: Medicare Other | Admitting: Physical Therapy

## 2018-09-23 ENCOUNTER — Encounter (HOSPITAL_COMMUNITY): Payer: Self-pay

## 2018-09-23 DIAGNOSIS — M1990 Unspecified osteoarthritis, unspecified site: Secondary | ICD-10-CM | POA: Diagnosis not present

## 2018-09-23 DIAGNOSIS — E1139 Type 2 diabetes mellitus with other diabetic ophthalmic complication: Secondary | ICD-10-CM | POA: Insufficient documentation

## 2018-09-23 DIAGNOSIS — H42 Glaucoma in diseases classified elsewhere: Secondary | ICD-10-CM | POA: Diagnosis not present

## 2018-09-23 DIAGNOSIS — I509 Heart failure, unspecified: Secondary | ICD-10-CM | POA: Diagnosis not present

## 2018-09-23 DIAGNOSIS — Z888 Allergy status to other drugs, medicaments and biological substances status: Secondary | ICD-10-CM | POA: Insufficient documentation

## 2018-09-23 DIAGNOSIS — G5602 Carpal tunnel syndrome, left upper limb: Secondary | ICD-10-CM | POA: Diagnosis not present

## 2018-09-23 DIAGNOSIS — G5612 Other lesions of median nerve, left upper limb: Secondary | ICD-10-CM | POA: Diagnosis not present

## 2018-09-23 DIAGNOSIS — Z8719 Personal history of other diseases of the digestive system: Secondary | ICD-10-CM | POA: Insufficient documentation

## 2018-09-23 DIAGNOSIS — I2510 Atherosclerotic heart disease of native coronary artery without angina pectoris: Secondary | ICD-10-CM | POA: Insufficient documentation

## 2018-09-23 DIAGNOSIS — M4802 Spinal stenosis, cervical region: Secondary | ICD-10-CM | POA: Diagnosis not present

## 2018-09-23 DIAGNOSIS — G5622 Lesion of ulnar nerve, left upper limb: Secondary | ICD-10-CM | POA: Insufficient documentation

## 2018-09-23 DIAGNOSIS — E1140 Type 2 diabetes mellitus with diabetic neuropathy, unspecified: Secondary | ICD-10-CM | POA: Insufficient documentation

## 2018-09-23 DIAGNOSIS — Z87442 Personal history of urinary calculi: Secondary | ICD-10-CM | POA: Insufficient documentation

## 2018-09-23 DIAGNOSIS — G4700 Insomnia, unspecified: Secondary | ICD-10-CM | POA: Insufficient documentation

## 2018-09-23 DIAGNOSIS — I110 Hypertensive heart disease with heart failure: Secondary | ICD-10-CM | POA: Diagnosis not present

## 2018-09-23 DIAGNOSIS — Z8673 Personal history of transient ischemic attack (TIA), and cerebral infarction without residual deficits: Secondary | ICD-10-CM | POA: Insufficient documentation

## 2018-09-23 DIAGNOSIS — Z20828 Contact with and (suspected) exposure to other viral communicable diseases: Secondary | ICD-10-CM | POA: Diagnosis not present

## 2018-09-23 DIAGNOSIS — E782 Mixed hyperlipidemia: Secondary | ICD-10-CM | POA: Insufficient documentation

## 2018-09-23 DIAGNOSIS — Z86711 Personal history of pulmonary embolism: Secondary | ICD-10-CM | POA: Insufficient documentation

## 2018-09-23 DIAGNOSIS — K219 Gastro-esophageal reflux disease without esophagitis: Secondary | ICD-10-CM | POA: Insufficient documentation

## 2018-09-23 DIAGNOSIS — N189 Chronic kidney disease, unspecified: Secondary | ICD-10-CM | POA: Insufficient documentation

## 2018-09-23 DIAGNOSIS — C9210 Chronic myeloid leukemia, BCR/ABL-positive, not having achieved remission: Secondary | ICD-10-CM | POA: Insufficient documentation

## 2018-09-23 DIAGNOSIS — Z79899 Other long term (current) drug therapy: Secondary | ICD-10-CM | POA: Insufficient documentation

## 2018-09-23 DIAGNOSIS — I129 Hypertensive chronic kidney disease with stage 1 through stage 4 chronic kidney disease, or unspecified chronic kidney disease: Secondary | ICD-10-CM | POA: Insufficient documentation

## 2018-09-23 DIAGNOSIS — E1151 Type 2 diabetes mellitus with diabetic peripheral angiopathy without gangrene: Secondary | ICD-10-CM | POA: Insufficient documentation

## 2018-09-23 DIAGNOSIS — G7000 Myasthenia gravis without (acute) exacerbation: Secondary | ICD-10-CM | POA: Insufficient documentation

## 2018-09-23 DIAGNOSIS — Z86718 Personal history of other venous thrombosis and embolism: Secondary | ICD-10-CM | POA: Insufficient documentation

## 2018-09-23 DIAGNOSIS — E1122 Type 2 diabetes mellitus with diabetic chronic kidney disease: Secondary | ICD-10-CM | POA: Insufficient documentation

## 2018-09-23 DIAGNOSIS — G8929 Other chronic pain: Secondary | ICD-10-CM | POA: Insufficient documentation

## 2018-09-23 DIAGNOSIS — Z7901 Long term (current) use of anticoagulants: Secondary | ICD-10-CM | POA: Insufficient documentation

## 2018-09-23 HISTORY — PX: ULNAR NERVE TRANSPOSITION: SHX2595

## 2018-09-23 HISTORY — PX: CARPAL TUNNEL RELEASE: SHX101

## 2018-09-23 LAB — PROTIME-INR
INR: 1 (ref 0.8–1.2)
Prothrombin Time: 13.4 seconds (ref 11.4–15.2)

## 2018-09-23 LAB — GLUCOSE, CAPILLARY
Glucose-Capillary: 99 mg/dL (ref 70–99)
Glucose-Capillary: 103 mg/dL — ABNORMAL HIGH (ref 70–99)

## 2018-09-23 SURGERY — ULNAR NERVE DECOMPRESSION/TRANSPOSITION
Anesthesia: General | Laterality: Left

## 2018-09-23 MED ORDER — FENTANYL CITRATE (PF) 250 MCG/5ML IJ SOLN
INTRAMUSCULAR | Status: DC | PRN
  Administered 2018-09-23: 25 ug via INTRAVENOUS

## 2018-09-23 MED ORDER — LIDOCAINE 2% (20 MG/ML) 5 ML SYRINGE
INTRAMUSCULAR | Status: AC
  Filled 2018-09-23: qty 5

## 2018-09-23 MED ORDER — FENTANYL CITRATE (PF) 100 MCG/2ML IJ SOLN
INTRAMUSCULAR | Status: AC
  Filled 2018-09-23: qty 2

## 2018-09-23 MED ORDER — 0.9 % SODIUM CHLORIDE (POUR BTL) OPTIME
TOPICAL | Status: DC | PRN
  Administered 2018-09-23: 1000 mL

## 2018-09-23 MED ORDER — PHENYLEPHRINE 40 MCG/ML (10ML) SYRINGE FOR IV PUSH (FOR BLOOD PRESSURE SUPPORT)
PREFILLED_SYRINGE | INTRAVENOUS | Status: AC
  Filled 2018-09-23: qty 10

## 2018-09-23 MED ORDER — BUPIVACAINE HCL (PF) 0.25 % IJ SOLN
INTRAMUSCULAR | Status: AC
  Filled 2018-09-23: qty 30

## 2018-09-23 MED ORDER — BACITRACIN ZINC 500 UNIT/GM EX OINT
TOPICAL_OINTMENT | CUTANEOUS | Status: AC
  Filled 2018-09-23: qty 28.35

## 2018-09-23 MED ORDER — EPHEDRINE 5 MG/ML INJ
INTRAVENOUS | Status: AC
  Filled 2018-09-23: qty 10

## 2018-09-23 MED ORDER — MEPERIDINE HCL 25 MG/ML IJ SOLN: 6.2500 mg | INTRAMUSCULAR | Status: DC | PRN

## 2018-09-23 MED ORDER — CEFAZOLIN SODIUM-DEXTROSE 2-4 GM/100ML-% IV SOLN
2.0000 g | INTRAVENOUS | Status: AC
  Administered 2018-09-23: 2 g via INTRAVENOUS
  Filled 2018-09-23: qty 100

## 2018-09-23 MED ORDER — BUPIVACAINE HCL (PF) 0.25 % IJ SOLN
INTRAMUSCULAR | Status: DC | PRN
  Administered 2018-09-23: 14 mL

## 2018-09-23 MED ORDER — CHLORHEXIDINE GLUCONATE CLOTH 2 % EX PADS: 6.0000 | MEDICATED_PAD | Freq: Once | CUTANEOUS | Status: DC

## 2018-09-23 MED ORDER — MIDAZOLAM HCL 5 MG/5ML IJ SOLN
INTRAMUSCULAR | Status: DC | PRN
  Administered 2018-09-23: 0.5 mg via INTRAVENOUS

## 2018-09-23 MED ORDER — LACTATED RINGERS IV SOLN
INTRAVENOUS | Status: DC | PRN
  Administered 2018-09-23: 07:00:00 via INTRAVENOUS

## 2018-09-23 MED ORDER — EPHEDRINE SULFATE-NACL 50-0.9 MG/10ML-% IV SOSY
PREFILLED_SYRINGE | INTRAVENOUS | Status: DC | PRN
  Administered 2018-09-23 (×2): 5 mg via INTRAVENOUS
  Administered 2018-09-23: 10 mg via INTRAVENOUS

## 2018-09-23 MED ORDER — SODIUM CHLORIDE 0.9 % IV SOLN
INTRAVENOUS | Status: DC | PRN
  Administered 2018-09-23: 30 ug/min via INTRAVENOUS

## 2018-09-23 MED ORDER — ONDANSETRON HCL 4 MG/2ML IJ SOLN
INTRAMUSCULAR | Status: DC | PRN
  Administered 2018-09-23: 4 mg via INTRAVENOUS

## 2018-09-23 MED ORDER — PROPOFOL 10 MG/ML IV BOLUS
INTRAVENOUS | Status: AC
  Filled 2018-09-23: qty 20

## 2018-09-23 MED ORDER — DEXAMETHASONE SODIUM PHOSPHATE 10 MG/ML IJ SOLN
INTRAMUSCULAR | Status: AC
  Filled 2018-09-23: qty 2

## 2018-09-23 MED ORDER — METOCLOPRAMIDE HCL 5 MG/ML IJ SOLN: 10.0000 mg | Freq: Once | INTRAMUSCULAR | Status: DC | PRN

## 2018-09-23 MED ORDER — MIDAZOLAM HCL 2 MG/2ML IJ SOLN
INTRAMUSCULAR | Status: AC
  Filled 2018-09-23: qty 2

## 2018-09-23 MED ORDER — FENTANYL CITRATE (PF) 250 MCG/5ML IJ SOLN
INTRAMUSCULAR | Status: AC
  Filled 2018-09-23: qty 5

## 2018-09-23 MED ORDER — PROPOFOL 10 MG/ML IV BOLUS
INTRAVENOUS | Status: DC | PRN
  Administered 2018-09-23: 100 mg via INTRAVENOUS

## 2018-09-23 MED ORDER — FENTANYL CITRATE (PF) 100 MCG/2ML IJ SOLN
25.0000 ug | INTRAMUSCULAR | Status: DC | PRN
  Administered 2018-09-23: 25 ug via INTRAVENOUS

## 2018-09-23 MED ORDER — PHENYLEPHRINE HCL (PRESSORS) 10 MG/ML IV SOLN
INTRAVENOUS | Status: DC | PRN
  Administered 2018-09-23: 80 ug via INTRAVENOUS
  Administered 2018-09-23: 120 ug via INTRAVENOUS

## 2018-09-23 MED ORDER — ONDANSETRON HCL 4 MG/2ML IJ SOLN
INTRAMUSCULAR | Status: AC
  Filled 2018-09-23: qty 2

## 2018-09-23 MED ORDER — DEXAMETHASONE SODIUM PHOSPHATE 10 MG/ML IJ SOLN
INTRAMUSCULAR | Status: DC | PRN
  Administered 2018-09-23: 5 mg via INTRAVENOUS

## 2018-09-23 MED ORDER — ROCURONIUM BROMIDE 10 MG/ML (PF) SYRINGE
PREFILLED_SYRINGE | INTRAVENOUS | Status: AC
  Filled 2018-09-23: qty 10

## 2018-09-23 MED ORDER — SUCCINYLCHOLINE CHLORIDE 200 MG/10ML IV SOSY
PREFILLED_SYRINGE | INTRAVENOUS | Status: AC
  Filled 2018-09-23: qty 10

## 2018-09-23 MED ORDER — LIDOCAINE HCL (CARDIAC) PF 100 MG/5ML IV SOSY
PREFILLED_SYRINGE | INTRAVENOUS | Status: DC | PRN
  Administered 2018-09-23: 100 mg via INTRAVENOUS

## 2018-09-23 SURGICAL SUPPLY — 54 items
APL SKNCLS STERI-STRIP NONHPOA (GAUZE/BANDAGES/DRESSINGS) ×1
BENZOIN TINCTURE PRP APPL 2/3 (GAUZE/BANDAGES/DRESSINGS) ×3 IMPLANT
BLADE SURG 15 STRL LF DISP TIS (BLADE) ×1 IMPLANT
BLADE SURG 15 STRL SS (BLADE) ×3
BNDG ELASTIC 4X5.8 VLCR STR LF (GAUZE/BANDAGES/DRESSINGS) ×5 IMPLANT
BNDG GAUZE ELAST 4 BULKY (GAUZE/BANDAGES/DRESSINGS) ×5 IMPLANT
CABLE BIPOLOR RESECTION CORD (MISCELLANEOUS) ×3 IMPLANT
CANISTER SUCT 3000ML PPV (MISCELLANEOUS) ×3 IMPLANT
CARTRIDGE OIL MAESTRO DRILL (MISCELLANEOUS) ×1 IMPLANT
CLOSURE WOUND 1/2 X4 (GAUZE/BANDAGES/DRESSINGS) ×1
COVER WAND RF STERILE (DRAPES) ×3 IMPLANT
DIFFUSER DRILL AIR PNEUMATIC (MISCELLANEOUS) ×3 IMPLANT
DRAPE EXTREMITY T 121X128X90 (DISPOSABLE) ×3 IMPLANT
DRAPE HALF SHEET 40X57 (DRAPES) ×3 IMPLANT
DRSG ADAPTIC 3X8 NADH LF (GAUZE/BANDAGES/DRESSINGS) ×2 IMPLANT
DURAPREP 26ML APPLICATOR (WOUND CARE) ×3 IMPLANT
ELECT CAUTERY BLADE 6.4 (BLADE) ×3 IMPLANT
ELECT REM PT RETURN 9FT ADLT (ELECTROSURGICAL) ×3
ELECTRODE REM PT RTRN 9FT ADLT (ELECTROSURGICAL) ×1 IMPLANT
GAUZE 4X4 16PLY RFD (DISPOSABLE) ×3 IMPLANT
GAUZE SPONGE 4X4 12PLY STRL (GAUZE/BANDAGES/DRESSINGS) ×3 IMPLANT
GAUZE SPONGE 4X4 12PLY STRL LF (GAUZE/BANDAGES/DRESSINGS) ×2 IMPLANT
GLOVE BIO SURGEON STRL SZ7 (GLOVE) IMPLANT
GLOVE BIO SURGEON STRL SZ8 (GLOVE) ×3 IMPLANT
GLOVE BIOGEL PI IND STRL 7.0 (GLOVE) IMPLANT
GLOVE BIOGEL PI INDICATOR 7.0 (GLOVE)
GOWN STRL REUS W/ TWL LRG LVL3 (GOWN DISPOSABLE) IMPLANT
GOWN STRL REUS W/ TWL XL LVL3 (GOWN DISPOSABLE) IMPLANT
GOWN STRL REUS W/TWL 2XL LVL3 (GOWN DISPOSABLE) ×3 IMPLANT
GOWN STRL REUS W/TWL LRG LVL3 (GOWN DISPOSABLE)
GOWN STRL REUS W/TWL XL LVL3 (GOWN DISPOSABLE)
KIT BASIN OR (CUSTOM PROCEDURE TRAY) ×3 IMPLANT
KIT TURNOVER KIT B (KITS) ×3 IMPLANT
NDL HYPO 25X1 1.5 SAFETY (NEEDLE) ×1 IMPLANT
NEEDLE HYPO 25X1 1.5 SAFETY (NEEDLE) ×3 IMPLANT
NS IRRIG 1000ML POUR BTL (IV SOLUTION) ×3 IMPLANT
OIL CARTRIDGE MAESTRO DRILL (MISCELLANEOUS) ×3
PACK SURGICAL SETUP 50X90 (CUSTOM PROCEDURE TRAY) ×3 IMPLANT
PAD ARMBOARD 7.5X6 YLW CONV (MISCELLANEOUS) ×3 IMPLANT
PENCIL BUTTON HOLSTER BLD 10FT (ELECTRODE) ×3 IMPLANT
STOCKINETTE 4X48 STRL (DRAPES) ×3 IMPLANT
STRIP CLOSURE SKIN 1/2X4 (GAUZE/BANDAGES/DRESSINGS) ×2 IMPLANT
SUT ETHILON 4 0 PS 2 18 (SUTURE) ×5 IMPLANT
SUT VIC AB 2-0 CP2 18 (SUTURE) ×4 IMPLANT
SUT VIC AB 3-0 SH 8-18 (SUTURE) ×5 IMPLANT
SYR BULB 3OZ (MISCELLANEOUS) ×3 IMPLANT
SYR BULB IRRIGATION 50ML (SYRINGE) ×3 IMPLANT
SYR CONTROL 10ML LL (SYRINGE) ×3 IMPLANT
TOWEL GREEN STERILE (TOWEL DISPOSABLE) ×3 IMPLANT
TOWEL GREEN STERILE FF (TOWEL DISPOSABLE) ×3 IMPLANT
TUBE CONNECTING 12'X1/4 (SUCTIONS) ×1
TUBE CONNECTING 12X1/4 (SUCTIONS) ×2 IMPLANT
UNDERPAD 30X30 (UNDERPADS AND DIAPERS) ×3 IMPLANT
WATER STERILE IRR 1000ML POUR (IV SOLUTION) ×3 IMPLANT

## 2018-09-23 NOTE — Anesthesia Postprocedure Evaluation (Signed)
Anesthesia Post Note  Patient: [name]  Procedure(s) Performed: ULNAR NERVE DECOMPRESSION/TRANSPOSITION (Left ) CARPAL TUNNEL RELEASE (Left )     Patient location during evaluation: PACU Anesthesia Type: General Level of consciousness: sedated and patient cooperative Pain management: pain level controlled Vital Signs Assessment: post-procedure vital signs reviewed and stable Respiratory status: spontaneous breathing Cardiovascular status: stable Anesthetic complications: no    Last Vitals:  Vitals:   09/23/18 0920 09/23/18 0935  BP: (!) 136/55 (!) 134/56  Pulse: 69 63  Resp: 13 16  Temp:  (!) 36.3 C  SpO2: 96% 98%    Last Pain:  Vitals:   09/23/18 0945  TempSrc:   PainSc: [place]

## 2018-09-23 NOTE — Op Note (Signed)
09/23/2018  9:01 AM  PATIENT:  [name]  78 y.o. male  PRE-OPERATIVE DIAGNOSIS: Left ulnar neuropathy, left median neuropathy  POST-OPERATIVE DIAGNOSIS:  same  PROCEDURE:  1.  Left ulnar nerve decompression at the elbow, 2.  Left carpal tunnel release  SURGEON:  Sherley Bounds, MD  ASSISTANTS: Glenford Peers, FNP  ANESTHESIA:   General  EBL: 5 ml  Total I/O In: -  Out: 5 [Blood:5]  BLOOD ADMINISTERED: none  DRAINS: none  SPECIMEN:  none  INDICATION FOR PROCEDURE: This patient presented with difficulty with his hands. Imaging showed cervical spinal stenosis.  Nerve conduction study showed severe ulnar neuropathy and median neuropathy the patient tried conservative measures without relief. Pain was debilitating. Recommended ulnar nerve decompression and left carpal tunnel release. Patient understood the risks, benefits, and alternatives and potential outcomes and wished to proceed.  PROCEDURE DETAILS: The patient was taken to the operating room and after induction of adequate generalized endotracheal anesthesia his left arm was extended on an armboard and prepped circumferentially from the fingertips to the axilla.  Timeout was performed.  Started with the carpal tunnel release.  5 cc of local anesthesia was injected and a palmar incision was made from the distal wrist crease into the palm in line with the webspace between the third and fourth digits.  We dissected down through the palmar fascia to identify the transverse carpal ligament.  The transverse carpal ligament was opened with a 15 blade scalpel to identify the underlying median nerve.  We then spread between the ligament and the nerve and completely transected the ligament both proximally and distally until the nerve was free.  Then palpated with a hemostat along the nerve to assure adequate decompression of the nerve.  We then irrigated with saline solution.  We then dried any bleeding.  We then closed the palmar fascia  with a single 3-0 Vicryl.  We then closed the subcutaneous tissues with 3-0 Vicryl.  We then closed the skin with interrupted 4-0 Ethilon vertical mattress sutures.  We then injected over the olecranon of the left elbow.  We made a curvilinear incision over the olecranon.  Self-retaining retractors were placed.  We dissected down through the soft tissues to identify the cubital tunnel.  There was significant thick scarring overlying the cubital tunnel.  We opened the scar tissue to identify the underlying ulnar nerve.  Once the nerve was identified we were able to dissect between the nerve and the overlying tissue and completely transect the overlying tissue both proximally and distally.  Proximally we dissected until we saw the medial head of the triceps.  Distally we dissected until we had to open the aponeurosis over the flexor musculature of the forearm.  We then dissected around the nerve but not fully circumferentially in order to maintain the vascular supply to the nerve.  We then flexed the elbow and the nerve did sublux somewhat.  However the nerve appeared to be quite free.  Therefore we irrigated with saline solution.  We dried all bleeding points.  We closed the subcutaneous tissue with 2-0 Vicryl in the subcuticular tissue with 3-0 Vicryl.  We closed the skin with benzoin and Steri-Strips.  We then wrapped the elbow and the hand in a Kerlix and an Ace bandage.  The patient was then awakened from general anesthesia and transported to the recovery room in stable condition.  At the end the procedure all sponge needle and instrument counts were correct.   PLAN OF CARE:  Discharge to home after PACU  PATIENT DISPOSITION:  PACU - hemodynamically stable.   Delay start of Pharmacological VTE agent (>24hrs) due to surgical blood loss or risk of bleeding:  yes

## 2018-09-23 NOTE — H&P (Signed)
Subjective: Patient is a 78 y.o. male admitted for L hand numbness and weakness. Onset of symptoms was several months ago, gradually worsening since that time.  The pain is rated moderate, and is located at the hands. The pain is described as aching and occurs intermittently. The symptoms have been progressive. Symptoms are exacerbated by nothing in particular. 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 Bruises easily    d/t being on Eliquis  . Carotid artery disease ([place])    a. Duplex 05/2014: 25-05% RICA, 3-97% LICA, elevated velocities in right subclavian artery, normal left subclavian artery..  . Chronic back pain    HNP  . Chronic kidney disease (CKD)   . Claustrophobia    takes Ativan as needed  . 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 Coronary atherosclerosis of unspecified type of vessel, native or graft    a. cath in 2003 showed 10-20% LM, scattered 20% prox-mid LAD, 30% more distal LAD, 50-60% stenosis of LAD towards apex, 20% Cx, 30% dRCA, focal 95% stenosis in smaller of 2 branches of PDA, EF 60-65%.  . Diabetes mellitus ([place])   . Diarrhea    takes Imodium daily as needed  . Diverticulosis   . Diverticulosis of colon (without mention of hemorrhage)   . DJD (degenerative joint disease)   . DVT (deep venous thrombosis) ([place]) 07/2011  . Esophageal stricture   . Essential hypertension    takes Imdur and Lisinopril daily  . Gastric polyps    benign  . GERD (gastroesophageal reflux disease)    takes Nexium daily  . Glaucoma    uses eye drops  . History of bronchitis    not sure when the last time  . History of colon polyps    benign  . History of kidney stones   . History of kidney stones   . Insomnia    takes Melatonin nightly as needed  . Mixed hyperlipidemia    takes Crestor daily  . Myasthenia gravis ([place])    uses prednisone  . Osteoporosis 2016  . Peripheral edema    takes Lasix daily  .  Pituitary tumor    [facility]Walden [facility] checks it every Jan   . Pneumonia 01/2016  . PONV (postoperative nausea and vomiting)   . Ptosis   . Pulmonary embolus ([place]) 2012  . Stroke [facility]) 06/2015    Past Surgical History:  Procedure Laterality Date  . ABDOMINAL AORTOGRAM W/LOWER EXTREMITY N/A 11/18/2016   Procedure: ABDOMINAL AORTOGRAM W/LOWER EXTREMITY;  Surgeon: [name], MD;  Location: [place] [facility];  Service: Cardiovascular;  Laterality: N/A;  . AMPUTATION Right 02/07/2018   Procedure: right leg AMPUTATION ABOVE KNEE;  Surgeon: [name], MD;  Location: [place];  Service: Vascular;  Laterality: Right;  . AORTOGRAM Right 12/23/2017   Procedure: AORTOGRAM, RIGHT LOWER EXTREMITY ANGIOGRAM, RIGHT SUPERFICIAL FEMORAL ARTERY ANGIOPLASTY WITH STENT, RIGHT POPLITEAL ARTERY ANGIOPLASTY WITH STENT, LEFT GROIN CUTDOWN AND LEFT FEMORAL ARTERY REPAIR;  Surgeon: [name], MD;  Location: [place];  Service: Vascular;  Laterality: Right;  . BACK SURGERY     x2, lumbar and cervical  . CARDIAC CATHETERIZATION    . CATARACT EXTRACTION Bilateral 12/12  . COLONOSCOPY    . ESOPHAGOGASTRODUODENOSCOPY ENDOSCOPY  04/2015  . IR GENERIC HISTORICAL  03/03/2016   IR IVC FILTER PLMT / S&I [name] GUID/MOD SED 03/03/2016 [name], MD  [facility]  . IR GENERIC HISTORICAL  04/09/2016   IR RADIOLOGIST EVAL & MGMT 04/09/2016 [name], MD [facility]  . IR GENERIC HISTORICAL  04/30/2016   IR IVC FILTER RETRIEVAL / S&I [name] GUID/MOD SED 04/30/2016 [name], MD [facility]INTER[facility]  . LITHOTRIPSY    . LUMBAR LAMINECTOMY/DECOMPRESSION MICRODISCECTOMY Right 09/14/2012   Procedure: Right Lumbar three-four, four-five, Lumbar five-Sacral one decompressive laminectomy;  Surgeon: [name], MD;  Location: [place] [facility];  Service: Neurosurgery;  Laterality: Right;  . LUMBAR LAMINECTOMY/DECOMPRESSION MICRODISCECTOMY Left 03/11/2016   Procedure: Left Lumbar Two-ThreeMicrodiscectomy;   Surgeon: [name], MD;  Location: [place];  Service: Neurosurgery;  Laterality: Left;  . PERIPHERAL VASCULAR INTERVENTION  11/18/2016   Procedure: PERIPHERAL VASCULAR INTERVENTION;  Surgeon: [name], MD;  Location: [place] [facility];  Service: Cardiovascular;;  Left popliteal  . SHOULDER SURGERY Left 05/07/2009  . TONSILLECTOMY      Prior to Admission medications   Medication Sig Start Date End Date Taking? Authorizing Provider  acetaminophen (TYLENOL) 325 MG tablet Take 1-2 tablets (325-650 mg total) by mouth every 4 (four) hours as needed for mild pain. 02/16/18  Yes Love, Ivan Anchors, PA-C  brimonidine-timolol (COMBIGAN) 0.2-0.5 % ophthalmic solution Place 1 drop into both eyes 2 (two) times daily.    Yes [provider]  cholecalciferol (VITAMIN D) 1000 units tablet Take 1,000 Units by mouth daily.    Yes [provider]  diclofenac sodium (VOLTAREN) 1 % GEL Apply 2 g topically 3 (three) times daily as needed. 08/15/18  Yes Quintella Reichert, MD  diphenoxylate-atropine (LOMOTIL) 2.5-0.025 MG tablet Take 1 tablet by mouth every 6 (six) hours as needed for diarrhea or loose stools. 05/19/18  Yes Armbruster, Carlota Raspberry, MD  ELIQUIS 5 MG TABS tablet TAKE 1 TABLET (5 MG TOTAL) BY MOUTH 2 (TWO) TIMES DAILY. 12/09/15  Yes Kathrynn Ducking, MD  furosemide (LASIX) 40 MG tablet Take 0.5 tablets (20 mg total) by mouth daily. 02/18/18 02/13/19 Yes Love, Ivan Anchors, PA-C  gabapentin (NEURONTIN) 100 MG capsule Take one at night for one week and then take one twice daily. 06/27/18  Yes Libby Maw, MD  imatinib (GLEEVEC) 100 MG tablet Take 300 mg by mouth daily with lunch. Take with noon meal and a large glass of water.Caution:Chemotherapy   Yes [provider]  isosorbide mononitrate (IMDUR) 30 MG 24 hr tablet Take 1 tablet (30 mg total) by mouth daily. 09/17/15  Yes Sherren Mocha, MD  lidocaine (LIDODERM) 5 % Place 1 patch onto the skin daily as needed (for back pain).  Remove & Discard patch within 12 hours or as directed by MD    Yes [provider]  loperamide (IMODIUM) 2 MG capsule Take by mouth as needed for diarrhea or loose stools.   Yes [provider]  methocarbamol (ROBAXIN) 500 MG tablet Take 500 mg by mouth every 8 (eight) hours as needed for muscle spasms.   Yes Latanya Maudlin, MD  Multiple Vitamin (MULTIVITAMIN) tablet Take 1 tablet by mouth daily.   Yes [provider]  predniSONE (DELTASONE) 1 MG tablet Two tablets daily for 2 months then take one tablet daily for 2 months, then stop 09/13/18  Yes Kathrynn Ducking, MD  pyridostigmine (MESTINON) 60 MG tablet Take 60 mg by mouth daily.   Yes [provider]  rosuvastatin (CRESTOR) 10 MG tablet Take 5 mg by mouth daily.   Yes [provider]  tamsulosin (  FLOMAX) 0.4 MG CAPS capsule Take 0.4 mg by mouth daily after supper.   Yes [provider]  traZODone (DESYREL) 100 MG tablet Take 1 tablet (100 mg total) by mouth at bedtime. 06/24/18  Yes Bayard Hugger, NP   Allergies  Allergen Reactions  . Other Other (See Comments)    Regarding scans, the PATIENT IS CLAUSTROPHOBIC!!!  . Phenergan [Promethazine Hcl] Swelling and Other (See Comments)    Cannot be combined with Zofran because swelling of the eyes resulted and patient became very restless-  . Zofran [Ondansetron Hcl] Swelling and Other (See Comments)    Cannot be combined with Phenergan because swelling of the eyes resulted and patient became very restless-  . Sulfamethoxazole Rash and Itching    Social History   Tobacco Use  . Smoking status: Never Smoker  . Smokeless tobacco: Never Used  Substance Use Topics  . Alcohol use: No    Alcohol/week: 0.0 standard drinks    Family History  Problem Relation Age of Onset  . Heart disease Mother   . Heart attack Mother   . Stroke Mother   . Heart disease Father   . Heart attack Father   . Stroke Father   . Breast cancer Sister         Twin   . Heart attack Sister   . Dementia Sister   . Heart disease Sister   . Heart attack Sister   . Clotting disorder Sister   . Heart attack Sister   . Colon cancer Neg Hx      Review of Systems  Positive ROS: neg  All other systems have been reviewed and were otherwise negative with the exception of those mentioned in the HPI and as above.  Objective: Vital signs in last 24 hours: Temp:  [97.8 F (36.6 C)] 97.8 F (36.6 C) (08/14 0556) Pulse Rate:  [64] 64 (08/14 0556) Resp:  [18] 18 (08/14 0556) BP: (161)/(84) 161/84 (08/14 0556) SpO2:  [99 %] 99 % (08/14 0556) Weight:  [67.1 kg] 67.1 kg (08/14 0556)  General Appearance: Alert, cooperative, no distress, appears stated age Head: Normocephalic, without obvious abnormality, atraumatic Eyes: PERRL, conjunctiva/corneas clear, EOM's intact    Neck: Supple, symmetrical, trachea midline Back: Symmetric, no curvature, ROM normal, no CVA tenderness Lungs:  respirations unlabored Heart: Regular rate and rhythm Abdomen: Soft, non-tender Extremities: Extremities normal, atraumatic, no cyanosis or edema Pulses: 2+ and symmetric all extremities Skin: Skin color, texture, turgor normal, no rashes or lesions  NEUROLOGIC:   Mental status: Alert and oriented x4,  no aphasia, good attention span, fund of knowledge, and memory Motor Exam - poor grip, R BKA Sensory Exam - grossly normal Reflexes: 3+ Coordination - grossly normal Gait - not tested Balance - not tested Cranial Nerves: I: smell Not tested  II: visual acuity  OS: nl    OD: nl  II: visual fields Full to confrontation  II: pupils Equal, round, reactive to light  III,VII: ptosis None  III,IV,VI: extraocular muscles  Full ROM  V: mastication Normal  V: facial light touch sensation  Normal  V,VII: corneal reflex  Present  VII: facial muscle function - upper  Normal  VII: facial muscle function - lower Normal  VIII: hearing Not tested  IX: soft palate elevation   Normal  IX,X: gag reflex Present  XI: trapezius strength  5/5  XI: sternocleidomastoid strength 5/5  XI: neck flexion strength  5/5  XII: tongue strength  Normal  Data Review Lab Results  Component Value Date   WBC 6.4 09/20/2018   HGB 10.9 (L) 09/20/2018   HCT 33.5 (L) 09/20/2018   MCV 89.1 09/20/2018   PLT 206 09/20/2018   Lab Results  Component Value Date   NA 140 09/20/2018   K 4.0 09/20/2018   CL 102 09/20/2018   CO2 28 09/20/2018   BUN 14 09/20/2018   CREATININE 1.79 (H) 09/20/2018   GLUCOSE 114 (H) 09/20/2018   Lab Results  Component Value Date   INR 1.0 09/23/2018    Assessment/Plan:  Estimated body mass index is 23.89 kg/m as calculated from the following:   Height as of this encounter: 5\' 6"  (1.676 m).   Weight as of this encounter: 67.1 kg. Patient admitted for L ulnar release and L CTR. Patient has failed a reasonable attempt at conservative therapy.  I explained the condition and procedure to the patient and answered any questions.  Patient wishes to proceed with procedure as planned. Understands risks/ benefits and typical outcomes of procedure.   [name] 09/23/2018 7:36 AM

## 2018-09-23 NOTE — Transfer of Care (Signed)
Immediate Anesthesia Transfer of Care Note  Patient: SYLVIO WEATHERALL  Procedure(s) Performed: Aleatha Borer NERVE DECOMPRESSION/TRANSPOSITION (Left ) CARPAL TUNNEL RELEASE (Left )  Patient Location: PACU  Anesthesia Type:General  Level of Consciousness: patient cooperative drowsy   Airway & Oxygen Therapy: Patient Spontanous Breathing and Patient connected to nasal cannula oxygen  Post-op Assessment: Report given to RN and Post -op Vital signs reviewed and stable  Post vital signs: Reviewed and stable  Last Vitals:  Vitals Value Taken Time  BP 134/56 09/23/18 0935  Temp 36.3 C 09/23/18 0935  Pulse 62 09/23/18 0942  Resp 15 09/23/18 0942  SpO2 98 % 09/23/18 0942  Vitals shown include unvalidated device data.  Last Pain:  Vitals:   09/23/18 0945  TempSrc:   PainSc: 4       Patients Stated Pain Goal: 3 (03/35/33 1740)  Complications: No apparent anesthesia complications

## 2018-09-23 NOTE — Anesthesia Procedure Notes (Signed)
Procedure Name: LMA Insertion Date/Time: 09/23/2018 7:54 AM Performed by: Shirlyn Goltz, CRNA Pre-anesthesia Checklist: Patient identified, Emergency Drugs available, Suction available and Patient being monitored Patient Re-evaluated:Patient Re-evaluated prior to induction Oxygen Delivery Method: Circle system utilized Preoxygenation: Pre-oxygenation with 100% oxygen Induction Type: IV induction Ventilation: Mask ventilation without difficulty LMA: LMA inserted LMA Size: 4.0 Number of attempts: 1 Placement Confirmation: positive ETCO2 and breath sounds checked- equal and bilateral Tube secured with: Tape Dental Injury: Teeth and Oropharynx as per pre-operative assessment

## 2018-09-24 ENCOUNTER — Encounter (HOSPITAL_COMMUNITY): Payer: Self-pay | Admitting: Neurological Surgery

## 2018-09-26 ENCOUNTER — Encounter: Payer: Medicare Other | Admitting: Physical Therapy

## 2018-09-27 ENCOUNTER — Encounter: Payer: Medicare Other | Admitting: Physical Therapy

## 2018-09-29 ENCOUNTER — Encounter: Payer: Medicare Other | Admitting: Physical Therapy

## 2018-09-30 ENCOUNTER — Encounter: Payer: Medicare Other | Admitting: Physical Therapy

## 2018-10-03 ENCOUNTER — Encounter: Payer: Medicare Other | Admitting: Physical Therapy

## 2018-10-04 ENCOUNTER — Other Ambulatory Visit: Payer: Self-pay

## 2018-10-04 ENCOUNTER — Ambulatory Visit: Payer: Medicare Other | Attending: Neurology | Admitting: Physical Therapy

## 2018-10-04 ENCOUNTER — Encounter: Payer: Self-pay | Admitting: Physical Therapy

## 2018-10-04 DIAGNOSIS — M545 Low back pain, unspecified: Secondary | ICD-10-CM

## 2018-10-04 DIAGNOSIS — R2689 Other abnormalities of gait and mobility: Secondary | ICD-10-CM

## 2018-10-04 DIAGNOSIS — G8929 Other chronic pain: Secondary | ICD-10-CM | POA: Insufficient documentation

## 2018-10-04 DIAGNOSIS — R2681 Unsteadiness on feet: Secondary | ICD-10-CM

## 2018-10-04 DIAGNOSIS — R293 Abnormal posture: Secondary | ICD-10-CM

## 2018-10-04 DIAGNOSIS — M542 Cervicalgia: Secondary | ICD-10-CM

## 2018-10-04 DIAGNOSIS — M6281 Muscle weakness (generalized): Secondary | ICD-10-CM

## 2018-10-04 DIAGNOSIS — M25552 Pain in left hip: Secondary | ICD-10-CM

## 2018-10-04 DIAGNOSIS — Z9181 History of falling: Secondary | ICD-10-CM

## 2018-10-04 NOTE — Therapy (Signed)
[facility] [address] [place], [name], [address] Phone: [phone]   Fax:  [phone]  Patient Details  Name: [name] MRN: [id number] Date of Birth: 05-07-1940 Referring Provider:  Kathrynn Ducking, MD  Encounter Date: 10/04/2018  Patient has not been cleared by surgeon to resume PT. If patient is cleared at appointment on Thursday, 10/06/2018 then PT will him next week.  If he is not cleared, then wife to cancel all PT appointments until next MD appointment.  Jamey Reas PT, DPT 10/04/2018, 3:59 PM  [facility] [address] [place], [name], [address] Phone: [phone]   Fax:  [phone]

## 2018-10-06 ENCOUNTER — Ambulatory Visit: Payer: Medicare Other | Admitting: Physical Therapy

## 2018-10-07 ENCOUNTER — Encounter: Payer: Medicare Other | Admitting: Physical Therapy

## 2018-10-08 ENCOUNTER — Emergency Department (HOSPITAL_COMMUNITY)
Admission: EM | Admit: 2018-10-08 | Discharge: 2018-10-08 | Disposition: A | Discharge status: Home / Self Care | Payer: No Typology Code available for payment source | Attending: Emergency Medicine | Admitting: Emergency Medicine

## 2018-10-08 ENCOUNTER — Emergency Department (HOSPITAL_BASED_OUTPATIENT_CLINIC_OR_DEPARTMENT_OTHER): Payer: No Typology Code available for payment source

## 2018-10-08 ENCOUNTER — Encounter (HOSPITAL_COMMUNITY): Payer: Self-pay

## 2018-10-08 ENCOUNTER — Other Ambulatory Visit: Payer: Self-pay

## 2018-10-08 DIAGNOSIS — E1122 Type 2 diabetes mellitus with diabetic chronic kidney disease: Secondary | ICD-10-CM | POA: Insufficient documentation

## 2018-10-08 DIAGNOSIS — R1032 Left lower quadrant pain: Secondary | ICD-10-CM

## 2018-10-08 DIAGNOSIS — N183 Chronic kidney disease, stage 3 (moderate): Secondary | ICD-10-CM | POA: Insufficient documentation

## 2018-10-08 DIAGNOSIS — R109 Unspecified abdominal pain: Secondary | ICD-10-CM | POA: Insufficient documentation

## 2018-10-08 DIAGNOSIS — R600 Localized edema: Secondary | ICD-10-CM | POA: Diagnosis not present

## 2018-10-08 DIAGNOSIS — I129 Hypertensive chronic kidney disease with stage 1 through stage 4 chronic kidney disease, or unspecified chronic kidney disease: Secondary | ICD-10-CM | POA: Diagnosis not present

## 2018-10-08 DIAGNOSIS — I2510 Atherosclerotic heart disease of native coronary artery without angina pectoris: Secondary | ICD-10-CM | POA: Diagnosis not present

## 2018-10-08 DIAGNOSIS — M7989 Other specified soft tissue disorders: Secondary | ICD-10-CM | POA: Diagnosis not present

## 2018-10-08 DIAGNOSIS — Z79899 Other long term (current) drug therapy: Secondary | ICD-10-CM | POA: Insufficient documentation

## 2018-10-08 LAB — CBC WITH DIFFERENTIAL/PLATELET
Abs Immature Granulocytes: 0.05 10*3/uL (ref 0.00–0.07)
Basophils Absolute: 0 10*3/uL (ref 0.0–0.1)
Basophils Relative: 0 %
Eosinophils Absolute: 0 10*3/uL (ref 0.0–0.5)
Eosinophils Relative: 0 %
HCT: 33 % — ABNORMAL LOW (ref 39.0–52.0)
Hemoglobin: 10.6 g/dL — ABNORMAL LOW (ref 13.0–17.0)
Immature Granulocytes: 0 %
Lymphocytes Relative: 5 %
Lymphs Abs: 0.7 10*3/uL (ref 0.7–4.0)
MCH: 29.1 pg (ref 26.0–34.0)
MCHC: 32.1 g/dL (ref 30.0–36.0)
MCV: 90.7 fL (ref 80.0–100.0)
Monocytes Absolute: 0.6 10*3/uL (ref 0.1–1.0)
Monocytes Relative: 5 %
Neutro Abs: 11.9 10*3/uL — ABNORMAL HIGH (ref 1.7–7.7)
Neutrophils Relative %: 90 %
Platelets: 289 10*3/uL (ref 150–400)
RBC: 3.64 MIL/uL — ABNORMAL LOW (ref 4.22–5.81)
RDW: 15.8 % — ABNORMAL HIGH (ref 11.5–15.5)
WBC: 13.3 10*3/uL — ABNORMAL HIGH (ref 4.0–10.5)
nRBC: 0 % (ref 0.0–0.2)

## 2018-10-08 LAB — BASIC METABOLIC PANEL
Anion gap: 10 (ref 5–15)
BUN: 21 mg/dL (ref 8–23)
CO2: 29 mmol/L (ref 22–32)
Calcium: 9.1 mg/dL (ref 8.9–10.3)
Chloride: 99 mmol/L (ref 98–111)
Creatinine, Ser: 1.74 mg/dL — ABNORMAL HIGH (ref 0.61–1.24)
GFR calc Af Amer: 42 mL/min — ABNORMAL LOW (ref >60)
GFR calc non Af Amer: 36 mL/min — ABNORMAL LOW (ref >60)
Glucose, Bld: 165 mg/dL — ABNORMAL HIGH (ref 70–99)
Potassium: 4 mmol/L (ref 3.5–5.1)
Sodium: 138 mmol/L (ref 135–145)

## 2018-10-08 MED ORDER — HYDROMORPHONE HCL 1 MG/ML IJ SOLN
0.5000 mg | Freq: Once | INTRAMUSCULAR | Status: AC
  Administered 2018-10-08: 16:00:00 0.5 mg via INTRAVENOUS
  Filled 2018-10-08: qty 1

## 2018-10-08 MED ORDER — OXYCODONE HCL 5 MG PO TABS
5.0000 mg | ORAL_TABLET | Freq: Once | ORAL | Status: AC
  Administered 2018-10-08: 20:00:00 5 mg via ORAL
  Filled 2018-10-08: qty 1

## 2018-10-08 MED ORDER — DIAZEPAM 5 MG PO TABS
5.0000 mg | ORAL_TABLET | Freq: Once | ORAL | Status: AC
  Administered 2018-10-08: 17:00:00 5 mg via ORAL
  Filled 2018-10-08: qty 1

## 2018-10-08 MED ORDER — OXYCODONE HCL 5 MG PO CAPS: 5.0000 mg | ORAL_CAPSULE | Freq: Four times a day (QID) | ORAL | 0 refills | Status: DC | PRN

## 2018-10-08 NOTE — ED Triage Notes (Signed)
Pt reports left sided groin pain for 2 months but the pain has gotten worse, pt taking Norco for pain without relief. Pt unsure of swelling or redness in the area. Pt is a right sided AKA and states he may have twisted or torn something while transferring.

## 2018-10-08 NOTE — Progress Notes (Signed)
VASCULAR LAB PRELIMINARY  PRELIMINARY  PRELIMINARY  PRELIMINARY  Left lower extremity venous duplex completed.    Preliminary report:  See CV proc for preliminary results.   Gave report to Dr. Phill Myron, [facility], RVT 10/08/2018, 6:48 PM

## 2018-10-08 NOTE — ED Provider Notes (Signed)
[facility] EMERGENCY DEPARTMENT Provider Note   CSN: [id number] Arrival date & time: 10/08/18  1425     History   Chief Complaint Chief Complaint  Patient presents with  . Groin Pain    HPI [name] is a 78 y.o. male.     Patient with h/o chronic kidney disease, right above-the-knee amputation, chronic anticoagulation, recent left ulnar and radial nerve decompression -- presents to the ED with ongoing L groin pain.  States that this been ongoing for several months.  Of note, there was an ED visit on 08/15/2018 for the same.  Patient had lower extremity Doppler and CT angio of the aorta/lower leg.  Patient states that the pain is worse with movement.  He has been taking Norco for upper extremity procedure which has not been helping.  Pain is worse with movement.  Patient also has swelling that is worse than usual of his left lower extremity.  He has decreased sensation but no true numbness.  No fevers or other systemic symptoms of illness.  He denies any back pain at the current time.  Patient was concerned that he had a muscle pull from transferring.   MRI Lumbar spine 09/04/2018:  IMPRESSION: 1. Severe lumbar spine degeneration without noted progression since study 1 year ago. 2. L2-3 moderate spinal stenosis with right more than left subarticular recess impingement. Severe right foraminal impingement. 3. L3-4 right subarticular recess stenosis. 4. L4-5 severe left foraminal impingement. Moderate spinal stenosis with impingement of both subarticular recesses. 5. L5-S1 advanced right foraminal impingement.  08/15/2018 LE Doppler  Left: There is no evidence of deep vein thrombosis in the lower extremity.  08/15/2018 CT angio AO+BIFEM W & OR WO Contrast IMPRESSION: No acute arterial abnormality.  Left-sided moderate femoropopliteal atherosclerotic changes, with no evidence of occlusion to the knee. There is evidence of developing stenosis in the popliteal  artery at the knee joint secondary to calcified plaque.  Right above knee amputation with occlusion in the mid SFA. There is estimated 50% narrowing of the right common femoral artery secondary to focal calcified plaque.  Evaluation of the left-sided tibial arteries is significantly limited by the advanced medial calcinosis, and patency of the left tibial arteries beyond the trifurcation cannot be assessed.  Aortic atherosclerosis. Aortic Atherosclerosis (ICD10-I70.0). Associated mild bilateral iliac arterial disease with no high-grade stenosis or occlusion.       Past Medical History:  Diagnosis Date  . Bruises easily    d/t being on Eliquis  . Carotid artery disease ([place])    a. Duplex 05/2014: 123456 RICA, 123456 LICA, elevated velocities in right subclavian artery, normal left subclavian artery..  . Chronic back pain    HNP  . Chronic kidney disease (CKD)   . Claustrophobia    takes Ativan as needed  . 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 Coronary atherosclerosis of unspecified type of vessel, native or graft    a. cath in 2003 showed 10-20% LM, scattered 20% prox-mid LAD, 30% more distal LAD, 50-60% stenosis of LAD towards apex, 20% Cx, 30% dRCA, focal 95% stenosis in smaller of 2 branches of PDA, EF 60-65%.  . Diabetes mellitus ([place])   . Diarrhea    takes Imodium daily as needed  . Diverticulosis   . Diverticulosis of colon (without mention of hemorrhage)   . DJD (degenerative joint disease)   . DVT (deep venous thrombosis) ([place]) 07/2011  . Esophageal stricture   .  Essential hypertension    takes Imdur and Lisinopril daily  . Gastric polyps    benign  . GERD (gastroesophageal reflux disease)    takes Nexium daily  . Glaucoma    uses eye drops  . History of bronchitis    not sure when the last time  . History of colon polyps    benign  . History of kidney stones   . History of kidney stones   . Insomnia    takes Melatonin  nightly as needed  . Mixed hyperlipidemia    takes Crestor daily  . Myasthenia gravis ([place])    uses prednisone  . Osteoporosis 2016  . Peripheral edema    takes Lasix daily  . Pituitary tumor    checks Dr[facility]Walden at [facility] checks it every Jan   . Pneumonia 01/2016  . PONV (postoperative nausea and vomiting)   . Ptosis   . Pulmonary embolus ([place]) 2012  . Stroke [facility]) 06/2015    Patient Active Problem List   Diagnosis Date Noted  . Neuropathic pain of right ankle 06/27/2018  . Insomnia due to medical condition 04/13/2018  . Atherosclerosis of native coronary artery of native heart without angina pectoris   . Hypokalemia   . Stage 3 chronic kidney disease ([place])   . 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 Acute blood loss anemia 02/08/2018  . Post-operative pain   . Unilateral AKA, right ([place])   . Ischemic foot 02/05/2018  . Ischemia of right lower extremity   . Pressure injury of skin 12/27/2017  . Arterial occlusive disease   . Postoperative pain   . CKD (chronic kidney disease), stage III ([place])   . History of CVA (cerebrovascular accident)   . 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 Septic arthritis of left sternoclavicular joint ([place]) 11/03/2017  . Cervical spinal cord compression ([place]) 10/30/2017  . Nasal abscess 09/25/2017  . Immunocompromised ([place]) 09/25/2017  . Acute on chronic blood loss anemia 08/05/2017  . Transient ischemic attack (TIA) 07/23/2017  . Subcortical infarction ([place])   . Right hemiparesis ([place])   . Ataxia 07/21/2017  . TIA (transient ischemic attack)   . Wheezing 06/17/2017  . Bursitis of left elbow 05/01/2017  . Dystonia 12/29/2016  . PAD (peripheral artery disease) ([place]) 11/18/2016  . Osteoporosis 08/28/2016  . Paronychia of great toe, left 08/10/2016  . Carotid arterial disease ([place]) 07/09/2016  . Hypocalcemia 07/09/2016  . Diplopia 07/02/2016  . S/P lumbar laminectomy 03/11/2016  . Radicular pain of left lower extremity  02/21/2016  . Cough 01/10/2016  . 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 Myasthenia gravis ([place])   . Diabetes mellitus type 2 in nonobese (HCC)   . Ataxia, post-stroke   . Gait disturbance, post-stroke   . Proximal leg weakness   . Cervical spondylosis without myelopathy   . Achilles tendinitis of right lower extremity   . Chronic diastolic CHF (congestive heart failure) ([place])   . Gastroesophageal reflux disease without esophagitis   . 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 Cerebellar stroke ([place])   . Ischemic stroke ([place])   . Cerebrovascular accident (CVA) due to thrombosis of left vertebral artery ([place])   . History of pulmonary embolus (PE)   . Chronic anticoagulation   . HLD (hyperlipidemia)   . Neck pain 03/19/2015  . Pain in the chest   . Diabetes mellitus type II, controlled ([place])   . Diabetic gastroparesis ([place]) 03/07/2015  .  Screening for osteoporosis 07/19/2014  . Dyspnea 04/09/2014  . Edema 04/09/2014  . Routine general medical examination at a health care facility 10/23/2013  . Bilateral carotid bruits 05/24/2013  . Abnormal CT scan, lung 05/24/2013  . Encounter for therapeutic drug monitoring 03/14/2013  . Lymphadenopathy, inguinal 01/11/2013  . AKI (acute kidney injury) ([place]) 10/28/2012  . 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 Myasthenia gravis with exacerbation ([place]) 03/31/2012  . Disease of salivary gland 12/09/2011  . Left facial pain 10/28/2011  . Pulmonary embolism ([place]) 10/25/2011  . 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 GERD with stricture 09/23/2011  . Long term (current) use of anticoagulants 08/03/2011  . History of pulmonary embolism 07/30/2011  . History of DVT (deep vein thrombosis) 07/30/2011  . Subconjunctival hemorrhage 06/22/2011  . Bilateral conjunctivitis 04/09/2011  . Diabetes mellitus type 2, controlled ([place]) 12/18/2010  . Hearing loss 08/04/2010  . Ptosis of eyelid  12/30/2009  . Essential hypertension 04/24/2009  . HYPERLIPIDEMIA, MIXED 10/05/2008  . HEMORRHOIDS-INTERNAL 07/02/2008  . HEMORRHOIDS-EXTERNAL 07/02/2008  . DIVERTICULOSIS-COLON 07/02/2008  . PERSONAL HX COLONIC POLYPS 07/02/2008  . RASH-NONVESICULAR 02/07/2008  . PNEUMONIA 12/08/2007  . Chronic myeloid leukemia in remission ([place]) 07/07/2007  . CAD (coronary artery disease) 07/07/2007  . NEPHROLITHIASIS, HX OF 07/07/2007    Past Surgical History:  Procedure Laterality Date  . ABDOMINAL AORTOGRAM W/LOWER EXTREMITY N/A 11/18/2016   Procedure: ABDOMINAL AORTOGRAM W/LOWER EXTREMITY;  Surgeon: [name], MD;  Location: [facility];  Service: Cardiovascular;  Laterality: N/A;  . AMPUTATION Right 02/07/2018   Procedure: right leg AMPUTATION ABOVE KNEE;  Surgeon: [name], MD;  Location: [facility];  Service: Vascular;  Laterality: Right;  . AORTOGRAM Right 12/23/2017   Procedure: AORTOGRAM, RIGHT LOWER EXTREMITY ANGIOGRAM, RIGHT SUPERFICIAL FEMORAL ARTERY ANGIOPLASTY WITH STENT, RIGHT POPLITEAL ARTERY ANGIOPLASTY WITH STENT, LEFT GROIN CUTDOWN AND LEFT FEMORAL ARTERY REPAIR;  Surgeon: [name], MD;  Location: [facility];  Service: Vascular;  Laterality: Right;  . BACK SURGERY     x2, lumbar and cervical  . CARDIAC CATHETERIZATION    . CARPAL TUNNEL RELEASE Left 09/23/2018   Procedure: CARPAL TUNNEL RELEASE;  Surgeon: [name], MD;  Location: [facility];  Service: Neurosurgery;  Laterality: Left;  CARPAL TUNNEL RELEASE  . CATARACT EXTRACTION Bilateral 12/12  . COLONOSCOPY    . ESOPHAGOGASTRODUODENOSCOPY ENDOSCOPY  04/2015  . IR GENERIC HISTORICAL  03/03/2016   IR IVC FILTER PLMT / S&I [name] GUID/MOD SED 03/03/2016 [name], MD [facility]INTER[facility] RAD  . IR GENERIC HISTORICAL  04/09/2016   IR RADIOLOGIST EVAL & MGMT 04/09/2016 [name], MD [facility] INTER[facility] RAD  . IR GENERIC HISTORICAL  04/30/2016   IR IVC FILTER RETRIEVAL / S&I [name] GUID/MOD SED 04/30/2016 [name],  MD [facility]-INTER[facility] RAD  . LITHOTRIPSY    . LUMBAR LAMINECTOMY/DECOMPRESSION MICRODISCECTOMY Right 09/14/2012   Procedure: Right Lumbar three-four, four-five, Lumbar five-Sacral one decompressive laminectomy;  Surgeon: [name], MD;  Location: [facility];  Service: Neurosurgery;  Laterality: Right;  . LUMBAR LAMINECTOMY/DECOMPRESSION MICRODISCECTOMY Left 03/11/2016   Procedure: Left Lumbar Two-ThreeMicrodiscectomy;  Surgeon: [name], MD;  Location: [facility];  Service: Neurosurgery;  Laterality: Left;  . PERIPHERAL VASCULAR INTERVENTION  11/18/2016   Procedure: PERIPHERAL VASCULAR INTERVENTION;  Surgeon: [name], MD;  Location: [facility];  Service: Cardiovascular;;  Left popliteal  . SHOULDER SURGERY Left 05/07/2009  . TONSILLECTOMY    . ULNAR NERVE TRANSPOSITION Left 09/23/2018  Procedure: ULNAR NERVE DECOMPRESSION/TRANSPOSITION;  Surgeon: [name], MD;  Location: [facility];  Service: Neurosurgery;  Laterality: Left;  ULNAR NERVE DECOMPRESSION/TRANSPOSITION        Home Medications    Prior to Admission medications   Medication Sig Start Date End Date Taking? Authorizing Provider  acetaminophen (TYLENOL) 325 MG tablet Take 1-2 tablets (325-650 mg total) by mouth every 4 (four) hours as needed for mild pain. 02/16/18  Yes Love, Ivan Anchors, PA-C  brimonidine-timolol (COMBIGAN) 0.2-0.5 % ophthalmic solution Place 1 drop into both eyes 2 (two) times daily.    Yes [provider]  cholecalciferol (VITAMIN D) 1000 units tablet Take 1,000 Units by mouth daily.    Yes [provider]  diclofenac sodium (VOLTAREN) 1 % GEL Apply 2 g topically 3 (three) times daily as needed. 08/15/18  Yes Quintella Reichert, MD  diphenoxylate-atropine (LOMOTIL) 2.5-0.025 MG tablet Take 1 tablet by mouth every 6 (six) hours as needed for diarrhea or loose stools. 05/19/18  Yes Armbruster, Carlota Raspberry, MD  ELIQUIS 5 MG TABS tablet TAKE 1 TABLET (5 MG TOTAL) BY MOUTH 2 (TWO) TIMES DAILY. 12/09/15   Yes Kathrynn Ducking, MD  furosemide (LASIX) 40 MG tablet Take 0.5 tablets (20 mg total) by mouth daily. Patient taking differently: Take 40 mg by mouth daily.  02/18/18 02/13/19 Yes Love, Ivan Anchors, PA-C  gabapentin (NEURONTIN) 100 MG capsule Take one at night for one week and then take one twice daily. Patient taking differently: Take 100 mg by mouth daily as needed.  06/27/18  Yes Libby Maw, MD  imatinib (GLEEVEC) 100 MG tablet Take 300 mg by mouth daily with lunch. Take with noon meal and a large glass of water.Caution:Chemotherapy   Yes [provider]  isosorbide mononitrate (IMDUR) 30 MG 24 hr tablet Take 1 tablet (30 mg total) by mouth daily. 09/17/15  Yes Sherren Mocha, MD  lidocaine (LIDODERM) 5 % Place 1 patch onto the skin daily as needed (for back pain). Remove & Discard patch within 12 hours or as directed by MD    Yes [provider]  loperamide (IMODIUM) 2 MG capsule Take by mouth as needed for diarrhea or loose stools.   Yes [provider]  methocarbamol (ROBAXIN) 500 MG tablet Take 500 mg by mouth every 8 (eight) hours as needed for muscle spasms.   Yes Latanya Maudlin, MD  Multiple Vitamin (MULTIVITAMIN) tablet Take 1 tablet by mouth daily.   Yes [provider]  predniSONE (DELTASONE) 1 MG tablet Two tablets daily for 2 months then take one tablet daily for 2 months, then stop Patient taking differently: Take 2 mg by mouth daily.  09/13/18  Yes Kathrynn Ducking, MD  pyridostigmine (MESTINON) 60 MG tablet Take 60 mg by mouth daily.   Yes [provider]  rosuvastatin (CRESTOR) 10 MG tablet Take 5 mg by mouth daily.   Yes [provider]  tamsulosin (FLOMAX) 0.4 MG CAPS capsule Take 0.4 mg by mouth daily after supper.   Yes [provider]  traZODone (DESYREL) 100 MG tablet Take 1 tablet (100 mg total) by mouth at bedtime. 06/24/18  Yes Bayard Hugger, NP  HYDROcodone-acetaminophen (NORCO/VICODIN) 5-325 MG  tablet Take 1 tablet by mouth every 6 (six) hours as needed. for pain 09/23/18   [provider]  traMADol (ULTRAM) 50 MG tablet Take 50 mg by mouth every 6 (six) hours as needed. 09/19/18   [provider]    Family  History Family History  Problem Relation Age of Onset  . Heart disease Mother   . Heart attack Mother   . Stroke Mother   . Heart disease Father   . Heart attack Father   . Stroke Father   . Breast cancer Sister        Twin   . Heart attack Sister   . Dementia Sister   . Heart disease Sister   . Heart attack Sister   . Clotting disorder Sister   . Heart attack Sister   . Colon cancer Neg Hx     Social History Social History   Tobacco Use  . Smoking status: Never Smoker  . Smokeless tobacco: Never Used  Substance Use Topics  . Alcohol use: No    Alcohol/week: 0.0 standard drinks  . Drug use: No     Allergies   Other, Phenergan [promethazine hcl], Zofran [ondansetron hcl], and Sulfamethoxazole   Review of Systems Review of Systems  Constitutional: Negative for fever.  HENT: Negative for rhinorrhea and sore throat.   Eyes: Negative for redness.  Respiratory: Negative for cough.   Cardiovascular: Negative for chest pain.  Gastrointestinal: Negative for abdominal pain, diarrhea, nausea and vomiting.  Genitourinary: Negative for dysuria.  Musculoskeletal: Positive for arthralgias and myalgias.  Skin: Negative for rash.  Neurological: Negative for headaches.     Physical Exam Updated Vital Signs BP (!) 131/50   Pulse 76   Temp 98.6 F (37 C) (Oral)   Resp (!) 22   Ht 5\' 6"  (1.676 m)   Wt 67.1 kg   SpO2 98%   BMI 23.89 kg/m   Physical Exam Vitals signs and nursing note reviewed.  Constitutional:      Appearance: He is well-developed.  HENT:     Head: Normocephalic and atraumatic.  Eyes:     General:        Right eye: No discharge.        Left eye: No discharge.     Conjunctiva/sclera: Conjunctivae normal.  Neck:      Musculoskeletal: Normal range of motion and neck supple.  Cardiovascular:     Rate and Rhythm: Normal rate and regular rhythm.     Heart sounds: Normal heart sounds.  Pulmonary:     Effort: Pulmonary effort is normal.     Breath sounds: Normal breath sounds.  Abdominal:     Palpations: Abdomen is soft.     Tenderness: There is no abdominal tenderness.  Musculoskeletal:        General: Swelling present.     Left hip: He exhibits tenderness. He exhibits normal range of motion and normal strength.     Left lower leg: Edema (1+ to the knee) present.       Legs:  Skin:    General: Skin is warm and dry.  Neurological:     Mental Status: He is alert.      ED Treatments / Results  Labs (all labs ordered are listed, but only abnormal results are displayed) Labs Reviewed  CBC WITH DIFFERENTIAL/PLATELET - Abnormal; Notable for the following components:      Result Value   WBC 13.3 (*)    RBC 3.64 (*)    Hemoglobin 10.6 (*)    HCT 33.0 (*)    RDW 15.8 (*)    Neutro Abs 11.9 (*)    All other components within normal limits  BASIC METABOLIC PANEL - Abnormal; Notable for the following components:   Glucose, Bld 165 (*)  Creatinine, Ser 1.74 (*)    GFR calc non Af Amer 36 (*)    GFR calc Af Amer 42 (*)    All other components within normal limits    EKG None  Radiology Vas [place] Lower Extremity Venous (dvt) (mc And Wl 7a-7p)  Result Date: 10/08/2018  Lower Venous Study Indications: Groin pain, and Swelling of foot and calf.  Risk Factors: Right AKA. Limitations: Patient's pain with compression and inability to recline completely. Comparison Study: Prior study done 08/15/18 Performing Technologist: Sharion Dove RVS  Examination Guidelines: A complete evaluation includes B-mode imaging, spectral Doppler, color Doppler, and power Doppler as needed of all accessible portions of each vessel. Bilateral testing is considered an integral part of a complete examination. Limited examinations  for reoccurring indications may be performed as noted.  Right Technical Findings: Right leg not evaluated.  +---------+---------------+---------+-----------+----------+--------------+ LEFT     CompressibilityPhasicitySpontaneityPropertiesThrombus Aging +---------+---------------+---------+-----------+----------+--------------+ CFV      Full           Yes      Yes                                 +---------+---------------+---------+-----------+----------+--------------+ SFJ      Full                                                        +---------+---------------+---------+-----------+----------+--------------+ FV Prox  Full                                                        +---------+---------------+---------+-----------+----------+--------------+ FV Mid   Full                                                        +---------+---------------+---------+-----------+----------+--------------+ FV DistalFull                                                        +---------+---------------+---------+-----------+----------+--------------+ PFV      Full                                                        +---------+---------------+---------+-----------+----------+--------------+ POP      Full           Yes      Yes                                 +---------+---------------+---------+-----------+----------+--------------+ PTV      Full                                                        +---------+---------------+---------+-----------+----------+--------------+  PERO     Full                                                        +---------+---------------+---------+-----------+----------+--------------+     Summary: Left: Findings appear essentially unchanged compared to previous examination. There is no evidence of deep vein thrombosis in the lower extremity.  *See table(s) above for measurements and observations.    Preliminary      Procedures Procedures (including critical care time)  Medications Ordered in ED Medications  oxyCODONE (Oxy IR/ROXICODONE) immediate release tablet 5 mg (has no administration in time range)  HYDROmorphone (DILAUDID) injection 0.5 mg (0.5 mg Intravenous Given 10/08/18 1531)  diazepam (VALIUM) tablet 5 mg (5 mg Oral Given 10/08/18 1641)     Initial Impression / Assessment and Plan / ED Course  I have reviewed the triage vital signs and the nursing notes.  Pertinent labs & imaging results that were available during my care of the patient were reviewed by me and considered in my medical decision making (see chart for details).        Patient seen and examined.  Reviewed previous extensive work-up findings.  Vital signs reviewed and are as follows: BP (!) 146/65   Pulse 64   Temp 98.6 F (37 C) (Oral)   Resp 12   Ht 5\' 6"  (1.676 m)   Wt 67.1 kg   SpO2 97%   BMI 23.89 kg/m   Patient discussed with and seen by Dr. Laverta Baltimore.  Ultrasound pending.  Labs show elevated white blood cell count however patient without other concerning features of infection at this point.  No signs of cellulitis, abscess, fasciitis.  Patient's pain is been ongoing for several months.  Given worse than baseline swelling, will obtain lower extremity venous ultrasound to rule out DVT.  DVT study was negative.  Patient updated on exam.  He will stop the Norco.  I have prescribed #10 oxycodone 5 mg.  Patient counseled on use of narcotic pain medications. Counseled not to combine these medications with others containing tylenol. Urged not to drink alcohol, drive, or perform any other activities that requires focus while taking these medications. The patient verbalizes understanding and agrees with the plan.  Use pain medication only under direct supervision at the lowest possible dose needed to control your pain.   Encouraged patient to get opinion from his orthopedic doctor given lack of vascular etiology.  We  discussed that this may be related to muscle irritation, muscle tear, nerve pain.  Final Clinical Impressions(s) / ED Diagnoses   Final diagnoses:  Left inguinal pain   Patient with ongoing L inguinal pain. Neg DVT. Recent arterial work-up neg. CT in July with normal bones. ?Radicular pain? No sign of skin infection, abscess, necrotizing fasciitis tonight.  ED Discharge Orders         Ordered    oxycodone (OXY-IR) 5 MG capsule  Every 6 hours PRN     10/08/18 1912           Carlisle Cater, Hershal Coria 10/08/18 1933    Long, Wonda Olds, MD 10/09/18 [phone]

## 2018-10-08 NOTE — ED Notes (Signed)
Patient verbalizes understanding of discharge instructions. Opportunity for questioning and answers were provided. Armband removed by staff, pt discharged from ED.  

## 2018-10-08 NOTE — Discharge Instructions (Signed)
Please read and follow all provided instructions.  Your diagnoses today include:  1. Left inguinal pain    Tests performed today include:  Blood counts and electrolytes  Ultrasound of the veins of your leg - no blood clot seen  Vital signs. See below for your results today.   Medications prescribed:   Oxycodone - narcotic pain medication  DO NOT drive or perform any activities that require you to be awake and alert because this medicine can make you drowsy.   Take any prescribed medications only as directed.  Home care instructions:  Follow any educational materials contained in this packet.  BE VERY CAREFUL not to take multiple medicines containing Tylenol (also called acetaminophen). Doing so can lead to an overdose which can damage your liver and cause liver failure and possibly death.   Follow-up instructions: Please follow-up with your primary care provider or your orthopedic doctor in the next week for further evaluation and to get their opinion.  Return instructions:   Please return to the Emergency Department if you experience worsening symptoms.   Return with uncontrolled pain, color change of the leg, fever.  Please return if you have any other emergent concerns.  Additional Information:  Your vital signs today were: BP (!) 146/65    Pulse 64    Temp 98.6 F (37 C) (Oral)    Resp 12    Ht 5\' 6"  (1.676 m)    Wt 67.1 kg    SpO2 97%    BMI 23.89 kg/m  If your blood pressure (BP) was elevated above 135/85 this visit, please have this repeated by your doctor within one month. --------------

## 2018-10-09 ENCOUNTER — Telehealth: Payer: Self-pay | Admitting: *Deleted

## 2018-10-09 NOTE — Telephone Encounter (Addendum)
TOC CM received call from [facility] stating they do not have capsule in stock for Oxy IR requesting to change to tablets. ED provider, Nanda Quinton updated.  Jay, [facility] ED TOC CM [phone]

## 2018-10-10 ENCOUNTER — Ambulatory Visit: Payer: Medicare Other | Admitting: Physical Therapy

## 2018-10-10 ENCOUNTER — Telehealth: Payer: Self-pay | Admitting: Cardiovascular Disease

## 2018-10-10 NOTE — Telephone Encounter (Signed)
Left a message for the patient to call back.  

## 2018-10-10 NOTE — Telephone Encounter (Signed)
° ° °  Spouse calling to report patient is having swelling and pain in leg (see MyChart message)

## 2018-10-10 NOTE — Telephone Encounter (Signed)
Ok will evaluate him tomorrow in office.

## 2018-10-10 NOTE — Telephone Encounter (Signed)
Dr. Fletcher Anon I need to see you urgently, my left leg is killing me. It is swollen, tight, and feels as it weighs 300 lbs. l have had MRIs, CAT Scans, and X rays and blood flow testing and no one can find anything . I wonder if I have a lymph node problem at the junction of my leg and body. I am in terrific pain and they giving me oxycodne . I was in the emergency room Saturday night and said I NEEDED to see a medicial doctor. I need to see ASAP. Thank you Fara Olden

## 2018-10-10 NOTE — Telephone Encounter (Signed)
Spoke with the patient about his symptoms. He stated that for the past month he has been having right leg pain that radiates from  his right groin area to his foot. He stated that his foot is swollen and that the skin on his leg and foot is tight.   He denies a temperature or color change. He stated that the pain is worse when he elevates his leg.  He went to the ED over the weekend. The venous doppler was negative for a DVT.  He has an appointment tomorrow with Dr. Fletcher Anon.

## 2018-10-11 ENCOUNTER — Other Ambulatory Visit: Payer: Self-pay

## 2018-10-11 ENCOUNTER — Encounter: Payer: Self-pay | Admitting: Cardiovascular Disease

## 2018-10-11 ENCOUNTER — Ambulatory Visit (INDEPENDENT_AMBULATORY_CARE_PROVIDER_SITE_OTHER): Payer: Medicare Other | Admitting: Cardiovascular Disease

## 2018-10-11 VITALS — BP 144/69 | HR 77 | Temp 97.1°F | Wt 148.0 lb

## 2018-10-11 DIAGNOSIS — I739 Peripheral vascular disease, unspecified: Secondary | ICD-10-CM | POA: Diagnosis not present

## 2018-10-11 DIAGNOSIS — I2510 Atherosclerotic heart disease of native coronary artery without angina pectoris: Secondary | ICD-10-CM

## 2018-10-11 DIAGNOSIS — E785 Hyperlipidemia, unspecified: Secondary | ICD-10-CM | POA: Diagnosis not present

## 2018-10-11 DIAGNOSIS — S78111A Complete traumatic amputation at level between right hip and knee, initial encounter: Secondary | ICD-10-CM

## 2018-10-11 MED ORDER — OXYCODONE HCL 5 MG PO CAPS: 5.0000 mg | ORAL_CAPSULE | Freq: Four times a day (QID) | ORAL | 0 refills | Status: DC | PRN

## 2018-10-11 NOTE — Progress Notes (Signed)
Cardiology Office Note   Date:  10/11/2018   ID:  [name], DOB 02/12/1940, MRN [id number]  PCP:  Libby Maw, MD  Cardiologist:  Dr. Burt Knack  No chief complaint on file.     History of Present Illness: [name] is a 78 y.o. male who is here today for a follow-up visit regarding peripheral arterial disease. He has chronic medical conditions that include coronary artery disease, diastolic heart failure, chronic myelogenous leukemia in remission, myasthenia gravis, recurrent DVT/pulmonary embolism (onchronic anticoagulation), peripheral arterial disease, chronic kidney disease and recurrent stroke. 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    He was seen in 2018 for severe bilateral calf claudication.   I proceeded with CO2 angiography in October 2018 which showed no significant aortoiliac disease.  There was heavily calcified occluded distal left SFA into the proximal left popliteal artery with three-vessel runoff below the knee.  This was treated with drug-coated balloon angioplasty and self-expanding stent placement due to flow-limiting dissection. On the right side, there was a calcified eccentric stenosis affecting the common femoral artery with borderline significant disease affecting the distal SFA and distal popliteal arteries. He presented in late 2019 with acute limb ischemia affecting the right lower extremity with evidence of occluded popliteal artery by duplex.  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   Is currently using a prosthesis.  He complains recently of increased numbness and swelling of the left leg.   Most recently, he has been dealing with severe pain starting in the left inguinal area going down to his thigh.  No calf discomfort.  He went to the emergency room multiple times for this.   He had CTA with lower extremity runoff that showed patent vessels on the left side.  In addition, he had left lower extremity venous duplex that showed no evidence of DVT.  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  The patient is crying from pain in spite of taking oxycodone.  Past Medical History:  Diagnosis Date  . Bruises easily    d/t being on Eliquis  . Carotid artery disease ([place])    a. Duplex 05/2014: 123456 RICA, 123456 LICA, elevated velocities in right subclavian artery, normal left subclavian artery..  . Chronic back pain    HNP  . Chronic kidney disease (CKD)   . Claustrophobia    takes Ativan as needed  . 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 Coronary atherosclerosis of unspecified type of vessel, native or graft    a. cath in 2003 showed 10-20% LM, scattered 20% prox-mid LAD, 30% more distal LAD, 50-60% stenosis of LAD towards apex, 20% Cx, 30% dRCA, focal 95% stenosis in smaller of 2 branches of PDA, EF 60-65%.  . Diabetes mellitus ([place])   . Diarrhea    takes Imodium daily as needed  . Diverticulosis   . Diverticulosis of colon (without mention of hemorrhage)   . DJD (degenerative joint disease)   . DVT (deep venous thrombosis) ([place]) 07/2011  . Esophageal stricture   . Essential hypertension    takes Imdur and Lisinopril daily  . Gastric polyps    benign  . GERD (gastroesophageal reflux disease)    takes Nexium daily  . Glaucoma    uses eye drops  . History of bronchitis  not sure when the last time  . History of colon polyps    benign  . History of kidney stones   . History of kidney stones   . Insomnia    takes Melatonin nightly as needed  . Mixed hyperlipidemia    takes Crestor daily  . Myasthenia gravis ([place])    uses prednisone  . Osteoporosis 2016  . Peripheral edema    takes Lasix daily  . Pituitary tumor    checks Dr[facility]Walden at [facility] checks it every Jan   . Pneumonia  01/2016  . PONV (postoperative nausea and vomiting)   . Ptosis   . Pulmonary embolus ([place]) 2012  . Stroke [facility]) 06/2015    Past Surgical History:  Procedure Laterality Date  . ABDOMINAL AORTOGRAM W/LOWER EXTREMITY N/A 11/18/2016   Procedure: ABDOMINAL AORTOGRAM W/LOWER EXTREMITY;  Surgeon: [name], MD;  Location: [facility];  Service: Cardiovascular;  Laterality: N/A;  . AMPUTATION Right 02/07/2018   Procedure: right leg AMPUTATION ABOVE KNEE;  Surgeon: [name], MD;  Location: [facility];  Service: Vascular;  Laterality: Right;  . AORTOGRAM Right 12/23/2017   Procedure: AORTOGRAM, RIGHT LOWER EXTREMITY ANGIOGRAM, RIGHT SUPERFICIAL FEMORAL ARTERY ANGIOPLASTY WITH STENT, RIGHT POPLITEAL ARTERY ANGIOPLASTY WITH STENT, LEFT GROIN CUTDOWN AND LEFT FEMORAL ARTERY REPAIR;  Surgeon: [name], MD;  Location: [facility];  Service: Vascular;  Laterality: Right;  . BACK SURGERY     x2, lumbar and cervical  . CARDIAC CATHETERIZATION    . CARPAL TUNNEL RELEASE Left 09/23/2018   Procedure: CARPAL TUNNEL RELEASE;  Surgeon: [name], MD;  Location: [facility];  Service: Neurosurgery;  Laterality: Left;  CARPAL TUNNEL RELEASE  . CATARACT EXTRACTION Bilateral 12/12  . COLONOSCOPY    . ESOPHAGOGASTRODUODENOSCOPY ENDOSCOPY  04/2015  . IR GENERIC HISTORICAL  03/03/2016   IR IVC FILTER PLMT / S&I [name] GUID/MOD SED 03/03/2016 [name], MD [facility] RAD  . IR GENERIC HISTORICAL  04/09/2016   IR RADIOLOGIST EVAL & MGMT 04/09/2016 [name], MD [facility] INTER[facility] RAD  . IR GENERIC HISTORICAL  04/30/2016   IR IVC FILTER RETRIEVAL / S&I [name] GUID/MOD SED 04/30/2016 [name], MD [facility]-INTERV RAD  . LITHOTRIPSY    . LUMBAR LAMINECTOMY/DECOMPRESSION MICRODISCECTOMY Right 09/14/2012   Procedure: Right Lumbar three-four, four-five, Lumbar five-Sacral one decompressive laminectomy;  Surgeon: [name], MD;  Location: [facility];  Service: Neurosurgery;  Laterality: Right;  . LUMBAR  LAMINECTOMY/DECOMPRESSION MICRODISCECTOMY Left 03/11/2016   Procedure: Left Lumbar Two-ThreeMicrodiscectomy;  Surgeon: [name], MD;  Location: [facility];  Service: Neurosurgery;  Laterality: Left;  . PERIPHERAL VASCULAR INTERVENTION  11/18/2016   Procedure: PERIPHERAL VASCULAR INTERVENTION;  Surgeon: [name], MD;  Location: [facility];  Service: Cardiovascular;;  Left popliteal  . SHOULDER SURGERY Left 05/07/2009  . TONSILLECTOMY    . ULNAR NERVE TRANSPOSITION Left 09/23/2018   Procedure: ULNAR NERVE DECOMPRESSION/TRANSPOSITION;  Surgeon: [name], MD;  Location: [facility];  Service: Neurosurgery;  Laterality: Left;  ULNAR NERVE DECOMPRESSION/TRANSPOSITION     Current Outpatient Medications  Medication Sig Dispense Refill  . acetaminophen (TYLENOL) 325 MG tablet Take 1-2 tablets (325-650 mg total) by mouth every 4 (four) hours as needed for mild pain.    . brimonidine-timolol (COMBIGAN) 0.2-0.5 % ophthalmic solution Place 1 drop into both eyes 2 (two) times daily.     . cholecalciferol (VITAMIN D) 1000 units tablet Take 1,000 Units by mouth daily.     Marland Kitchen  diclofenac sodium (VOLTAREN) 1 % GEL Apply 2 g topically 3 (three) times daily as needed. 50 g 0  . diphenoxylate-atropine (LOMOTIL) 2.5-0.025 MG tablet Take 1 tablet by mouth every 6 (six) hours as needed for diarrhea or loose stools. 90 tablet 1  . ELIQUIS 5 MG TABS tablet TAKE 1 TABLET (5 MG TOTAL) BY MOUTH 2 (TWO) TIMES DAILY. 60 tablet 3  . furosemide (LASIX) 40 MG tablet Take 0.5 tablets (20 mg total) by mouth daily. (Patient taking differently: Take 40 mg by mouth daily. ) 45 tablet 3  . gabapentin (NEURONTIN) 100 MG capsule Take one at night for one week and then take one twice daily. (Patient taking differently: Take 100 mg by mouth daily as needed. ) 60 capsule 1  . HYDROcodone-acetaminophen (NORCO/VICODIN) 5-325 MG tablet Take 1 tablet by mouth every 6 (six) hours as needed. for pain    . imatinib (GLEEVEC) 100 MG  tablet Take 300 mg by mouth daily with lunch. Take with noon meal and a large glass of water.Caution:Chemotherapy    . isosorbide mononitrate (IMDUR) 30 MG 24 hr tablet Take 1 tablet (30 mg total) by mouth daily. 90 tablet 3  . lidocaine (LIDODERM) 5 % Place 1 patch onto the skin daily as needed (for back pain). Remove & Discard patch within 12 hours or as directed by MD     . loperamide (IMODIUM) 2 MG capsule Take by mouth as needed for diarrhea or loose stools.    . methocarbamol (ROBAXIN) 500 MG tablet Take 500 mg by mouth every 8 (eight) hours as needed for muscle spasms.    . methylPREDNISolone (MEDROL) 4 MG tablet Take 4 mg by mouth See admin instructions. 6 day taper 6-5-4-3-2-1    . Multiple Vitamin (MULTIVITAMIN) tablet Take 1 tablet by mouth daily.    Marland Kitchen oxycodone (OXY-IR) 5 MG capsule Take 1 capsule (5 mg total) by mouth every 6 (six) hours as needed. 20 capsule 0  . predniSONE (DELTASONE) 1 MG tablet Two tablets daily for 2 months then take one tablet daily for 2 months, then stop (Patient taking differently: Take 2 mg by mouth daily. ) 60 tablet 2  . pyridostigmine (MESTINON) 60 MG tablet Take 60 mg by mouth daily.    . rosuvastatin (CRESTOR) 10 MG tablet Take 5 mg by mouth daily.    . tamsulosin (FLOMAX) 0.4 MG CAPS capsule Take 0.4 mg by mouth daily after supper.    . traMADol (ULTRAM) 50 MG tablet Take 50 mg by mouth every 6 (six) hours as needed.    . traZODone (DESYREL) 100 MG tablet Take 1 tablet (100 mg total) by mouth at bedtime. 30 tablet 2   No current facility-administered medications for this visit.     Allergies:   Other, Phenergan [promethazine hcl], Zofran [ondansetron hcl], and Sulfamethoxazole    Social History:  The patient  reports that he has never smoked. He has never used smokeless tobacco. He reports that he does not drink alcohol or use drugs.   Family History:  The patient's family history includes Breast cancer in his sister; Clotting disorder in his  sister; Dementia in his sister; Heart attack in his father, mother, sister, sister, and sister; Heart disease in his father, mother, and sister; Stroke in his father and mother.    ROS:  Please see the history of present illness.   Otherwise, review of systems are positive for none.   All other systems are reviewed and negative.  PHYSICAL EXAM: VS:  BP (!) 144/69   Pulse 77   Temp (!) 97.1 F (36.2 C)   Wt 148 lb (67.1 kg) Comment: Pt stated  SpO2 98%   BMI 23.89 kg/m  , BMI Body mass index is 23.89 kg/m. GEN: Well nourished, well developed, in no acute distress  HEENT: normal  Neck: no JVD, carotid bruits, or masses Cardiac: RRR; no murmurs, rubs, or gallops,no edema  Respiratory:  clear to auscultation bilaterally, normal work of breathing GI: soft, nontender, nondistended, + BS MS: no deformity or atrophy  Skin: warm and dry, no rash Neuro:  Strength and sensation are intact Psych: euthymic mood, full affect Vascular: Femoral pulse on the left is normal with no palpable masses or lymph nodes.  EKG:  EKG is not ordered today.   Recent Labs: 11/06/2017: Magnesium 2.0 02/11/2018: ALT 19 10/08/2018: BUN 21; Creatinine, Ser 1.74; Hemoglobin 10.6; Platelets 289; Potassium 4.0; Sodium 138    Lipid Panel    Component Value Date/Time   CHOL 134 07/20/2017 0525   TRIG 166 (H) 07/20/2017 0525   HDL 33 (L) 07/20/2017 0525   CHOLHDL 4.1 07/20/2017 0525   VLDL 33 07/20/2017 0525   LDLCALC 68 07/20/2017 0525   LDLDIRECT 59.8 10/12/2012 0737      Wt Readings from Last 3 Encounters:  10/11/18 148 lb (67.1 kg)  10/08/18 148 lb (67.1 kg)  09/23/18 148 lb (67.1 kg)       No flowsheet data found.    ASSESSMENT AND PLAN:   1.    Severe left inguinal and thigh pain: Does not seem to be vascular in etiology.  Recent CTA was reassuring and he has a palpable left common femoral artery pulse.  In addition, venous duplex showed no evidence of DVT.  The patient is having severe  pain from this and he mentions that he cannot live like this.  I refilled his oxycodone. I am forwarding this to his primary care physician and neurosurgeon.  It is possible that his pain is due to his severe lumbar spine disease.  2. Peripheral arterial disease :   Status post left SFA drug-coated balloon angioplasty and self-expanding stent placement .  He is status post right above-the-knee amputation for acute limb ischemia.  Recent CTA showed patent vessels on the left side with moderate popliteal artery stenosis.  considered.  2. Coronary artery disease involving native coronary arteries without angina: Continue medical therapy.  3. CML: Currently on Gleevec.  4. Hyperlipidemia: Continue treatment with rosuvastatin with a target LDL of less than 70.  5.  Recurrent DVT/PE: On anticoagulation.     Disposition:   FU with me in 3 months  Signed,  [name], MD  10/11/2018 10:33 AM    [place]

## 2018-10-11 NOTE — Patient Instructions (Signed)
Medication Instructions:  Your physician recommends that you continue on your current medications as directed. Please refer to the Current Medication list given to you today.  If you need a refill on your cardiac medications before your next appointment, please call your pharmacy.   Lab work: None ordered If you have labs (blood work) drawn today and your tests are completely normal, you will receive your results only by: MyChart Message (if you have MyChart) OR A paper copy in the mail If you have any lab test that is abnormal or we need to change your treatment, we will call you to review the results.  Testing/Procedures: None ordered  Follow-Up: At [facility], you and your health needs are our priority.  As part of our continuing mission to provide you with exceptional heart care, we have created designated Provider Care Teams.  These Care Teams include your primary Cardiologist (physician) and Advanced Practice Providers (APPs -  Physician Assistants and Nurse Practitioners) who all work together to provide you with the care you need, when you need it. You will need a follow up appointment in 3 months.  Please call our office 2 months in advance to schedule this appointment.  You may see Muhammad Arida, MD or one of the following Advanced Practice Providers on your designated Care Team:   Luke Kilroy, PA-C Krista Kroeger, PA-C Callie Goodrich, PA-C       

## 2018-10-12 ENCOUNTER — Ambulatory Visit: Payer: Medicare Other | Admitting: Physical Therapy

## 2018-10-14 DIAGNOSIS — G5613 Other lesions of median nerve, bilateral upper limbs: Secondary | ICD-10-CM | POA: Diagnosis not present

## 2018-10-14 DIAGNOSIS — Z4789 Encounter for other orthopedic aftercare: Secondary | ICD-10-CM | POA: Diagnosis not present

## 2018-10-14 DIAGNOSIS — G5603 Carpal tunnel syndrome, bilateral upper limbs: Secondary | ICD-10-CM | POA: Diagnosis not present

## 2018-10-14 DIAGNOSIS — G5623 Lesion of ulnar nerve, bilateral upper limbs: Secondary | ICD-10-CM | POA: Diagnosis not present

## 2018-10-14 DIAGNOSIS — Z7901 Long term (current) use of anticoagulants: Secondary | ICD-10-CM | POA: Diagnosis not present

## 2018-10-14 DIAGNOSIS — M4802 Spinal stenosis, cervical region: Secondary | ICD-10-CM | POA: Diagnosis not present

## 2018-10-14 DIAGNOSIS — I10 Essential (primary) hypertension: Secondary | ICD-10-CM | POA: Diagnosis not present

## 2018-10-14 DIAGNOSIS — Z8673 Personal history of transient ischemic attack (TIA), and cerebral infarction without residual deficits: Secondary | ICD-10-CM | POA: Diagnosis not present

## 2018-10-14 DIAGNOSIS — M5410 Radiculopathy, site unspecified: Secondary | ICD-10-CM | POA: Diagnosis not present

## 2018-10-14 DIAGNOSIS — Z89611 Acquired absence of right leg above knee: Secondary | ICD-10-CM | POA: Diagnosis not present

## 2018-10-18 ENCOUNTER — Ambulatory Visit: Payer: Medicare Other | Admitting: Physical Therapy

## 2018-10-20 ENCOUNTER — Ambulatory Visit: Payer: Medicare Other | Admitting: Physical Therapy

## 2018-10-20 ENCOUNTER — Encounter: Payer: Self-pay | Admitting: Physical Therapy

## 2018-10-20 NOTE — Therapy (Signed)
[facility] [address] [place], [name], [address] Phone: [phone]   Fax:  [phone]  Patient Details  Name: [name] MRN: [id number] Date of Birth: 1940/04/01 Referring Provider:  Margette Fast, MD  Encounter Date: 10/20/2018  PHYSICAL THERAPY DISCHARGE SUMMARY  Visits from Start of Care: 19  Current functional level related to goals / functional outcomes: He made small amount of progress initially with Transfemoral prosthesis. Then he developed weakness with cervical & lumbar radiculopathy. He has surgery 09/23/2018 for right ulnar nerve decompression.   His PT care was transferred to [facility] following surgery.    Remaining deficits: Dependent in function with Transfemoral prosthesis.  See PT note on 09/01/2018 for functional level.    Education / Equipment: Prosthetic care, HEP  Plan: Patient agrees to discharge.  Patient goals were not met. Patient is being discharged due to a change in medical status.  ?????         Jamey Reas, PT, DPT 10/20/2018, 10:07 PM  [facility] [address] [place], [name], [address] Phone: [phone]   Fax:  [phone]

## 2018-10-24 ENCOUNTER — Ambulatory Visit: Payer: Medicare Other | Admitting: Physical Therapy

## 2018-10-25 ENCOUNTER — Telehealth: Payer: Self-pay | Admitting: Neurology

## 2018-10-25 ENCOUNTER — Telehealth: Payer: Self-pay | Admitting: *Deleted

## 2018-10-25 NOTE — Telephone Encounter (Signed)
I called the patient.  Due to a change in his renal status, the recommended dosing of Eliquis has been changed to 2.5 mg twice daily dose.  I have not written a prescription for this for the patient in 3 years, I called him, he indicates that he is getting the medication through the [facility] hospital, he will call them and indicate that the dose needs to be reduced.

## 2018-10-25 NOTE — Telephone Encounter (Signed)
Call back staff: Please contact surgeon and confirm the following: 1. Is this a spinal (steroid) injection? 2. Is the patient to have gen anesthesia?  Please route back to P CV DIV PREOP. Richardson Dopp, PA-C    10/25/2018 12:29 PM

## 2018-10-25 NOTE — Telephone Encounter (Signed)
   [facility] Pre-operative Risk Assessment    Request for surgical clearance:  1. What type of surgery is being performed? TFESI-LEFT-L3-4   2. When is this surgery scheduled? 11/08/18   3. What type of clearance is required (medical clearance vs. Pharmacy clearance to hold med vs. Both)? BOTH  4. Are there any medications that need to be held prior to surgery and how long? ELIQUIS 3 DAYS PRIOR   5. Practice name and name of physician performing surgery? [place] [facility]; DR. PAUL HARKINS   6. What is your office phone number [phone]    7.   What is your office fax number [phone]  8.   Anesthesia type (None, local, MAC, general) ? NOT LISTED    [name] 10/25/2018, 12:17 PM  _________________________________________________________________   (provider comments below)

## 2018-10-25 NOTE — Telephone Encounter (Signed)
   Reviewed chart.  Patient to have epidural steroid injection.  This is a low risk procedure.  He just needs recommendations regarding anticoagulation.  I will fwd to Pharmacy Clinic for recommendations on holding Eliquis.  Past Medical Hx  Coronary artery disease  Remote cardiac catheterization with distal vessel disease-treated medically  Low risk Myoview 0000000  Chronic diastolic CHF  Peripheral arterial disease  s/p L SFA stenting October 2018  S/p R above-the-knee amputation December 2019  Recurrent DVT/pulmonary embolism  Chronic anticoagulation  History of recurrent strokes  Chronic kidney disease  Myasthenia gravis  Chronic myelogenous leukemia, in remission  Last OV: Richardson Dopp, PA-C 07/15/2018; Dr. Fletcher Anon 10/11/2018  Richardson Dopp, PA-C    10/25/2018 2:15 PM

## 2018-10-25 NOTE — Telephone Encounter (Signed)
Pt takes Eliquis for recurrent DVT/PE, also with history of recurrent stroke. SCr is 1.74, CrCl 2mL/min. Typically use lower dose of Eliquis 2.5mg  BID for long term prevention of recurrent VTE - not sure if higher dose is being used due to recurrent stroke as well. Eliquis was last prescribed by Dr Jannifer Franklin in 2017, not sure who has been managing since then. Typical request is to hold Eliquis for 3 days prior to spinal procedure, however pt is at higher risk. Recommend deferring to prescribing MD regarding clearance.

## 2018-10-25 NOTE — Telephone Encounter (Signed)
This is a spinal injection under local anesthesia.

## 2018-10-25 NOTE — Telephone Encounter (Signed)
I called the patient, he gets his Eliquis through the [facility] hospital, I have indicated that the pharmacy has recommended a reduction of dose due to decline in his renal status, he should be on a 2.5 mg twice daily dose.  He will call the [facility] and let them know about this.

## 2018-10-26 ENCOUNTER — Encounter: Payer: Medicare Other | Admitting: Physical Therapy

## 2018-10-26 NOTE — Telephone Encounter (Signed)
   Call back staff: Marland Kitchen Clearance letter has been faxed to the requesting surgeon. . Please contact the surgeon's office to ensure it has been received. . This phone note will be removed from the preop pool.  Richardson Dopp, PA-C    10/26/2018 2:37 PM

## 2018-10-26 NOTE — Telephone Encounter (Signed)
Dr. Burt Knack This patient needs Eliquis held for upcoming ESI. He is on long term anticoagulation due to a hx of recurrent DVT/pulmonary embolism. It is not clear who has been managing his Eliquis.  It is probably his physician at the [place]. I can ask for his surgeon to request recommendations regarding his Eliquis from the physician at the [place]. But, I wanted to check with you 1st.   Richardson Dopp, PA-C    10/26/2018 9:34 AM

## 2018-10-26 NOTE — Telephone Encounter (Signed)
[name] - thanks for reaching out. As you know he has a lot of medical problems. I think the safest thing to do is to have him hold eliquis x 3 days, have the injection, then resume eliquis post-procedure with no bridging. thx

## 2018-10-26 NOTE — Telephone Encounter (Signed)
Left message for Dr. Maryjean Ka surgery scheduler to call if clearance letter has not been received. I will remove from the call back pool.

## 2018-10-28 ENCOUNTER — Other Ambulatory Visit: Payer: Self-pay | Admitting: Neurological Surgery

## 2018-10-31 ENCOUNTER — Encounter: Payer: Medicare Other | Admitting: Physical Therapy

## 2018-10-31 ENCOUNTER — Telehealth: Payer: Self-pay | Admitting: *Deleted

## 2018-10-31 NOTE — Telephone Encounter (Signed)
Patient with diagnosis of DVT/PE and recurrent stroke on Eliquis for anticoagulation.    Procedure: C5 CORPECTOMY, C3-4 ANTERIOR CERVICAL FUSION Date of procedure: TBD  CrCl 32 ml/min  Per previous clearance encounter on 10/25/2018 per Dr. Burt Knack patient is to hold Eliquis without a bridge. Patient may hold Eliquis for 3 days prior to procedure.

## 2018-10-31 NOTE — Telephone Encounter (Signed)
   [facility] Pre-operative Risk Assessment    Request for surgical clearance:  1. What type of surgery is being performed? C5 CORPECTOMY, C3-4 ANTERIOR CERVICAL FUSION   2. When is this surgery scheduled? TBD   3. What type of clearance is required (medical clearance vs. Pharmacy clearance to hold med vs. Both)? BOTH  4. Are there any medications that need to be held prior to surgery and how long? ELIQUIS   5. Practice name and name of physician performing surgery? [facility]; DR. Sherley Bounds    6. What is your office phone number [phone]    7.   What is your office fax number [phone]  8.   Anesthesia type (None, local, MAC, general) ? GENERAL   Julaine Hua 10/31/2018, 10:36 AM  _________________________________________________________________   (provider comments below)

## 2018-11-01 ENCOUNTER — Telehealth: Payer: Self-pay | Admitting: Cardiovascular Disease

## 2018-11-01 NOTE — Telephone Encounter (Signed)
   Primary [place], MD  Chart reviewed as part of pre-operative protocol coverage. Because of [name]'s past medical history and time since last visit, he/she will require a follow-up visit in order to better assess preoperative cardiovascular risk.  Pre-op covering staff: - Please schedule appointment and call patient to inform them. - Please contact requesting surgeon's office via preferred method (i.e, phone, fax) to inform them of need for appointment prior to surgery.  If applicable, this message will also be routed to pharmacy pool and/or primary cardiologist for input on holding anticoagulant/antiplatelet agent as requested below so that this information is available at time of patient's appointment.   Please move his Oct followup to before 10/4 given higher risk procedure  Almyra Deforest, PA  11/01/2018, 10:03 AM

## 2018-11-01 NOTE — Telephone Encounter (Signed)
Left message for [place] surgery scheduler for Dr. Maryjean Ka office we faxed surgery clearance on 10/26/18 @ 2:32 pm, if still have not received please call and we will re-fax. I also returned call to the pt and left message that we did fax clearance over on 10/26/18 @ 2:32 pm.

## 2018-11-01 NOTE — Telephone Encounter (Signed)
Follow Up  Patient is calling back in stating that clearance letter has not been received by the surgery scheduler. Please fax clearance letter and call office and patient to confirm.

## 2018-11-01 NOTE — Telephone Encounter (Signed)
No message needed °

## 2018-11-02 NOTE — Telephone Encounter (Signed)
Patient was originally scheduled for neck surgery on 10/7, however this has moved up to 9/30. He lives in [place] and unable to arrange transportation for cardiology visit.   I have called the patient, he denies any recent chest pain. 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 He says he is working with physical therapy for 4 hours a week and has been able to exercise without exertional chest pain or worsening dyspnea. His RCRI risk is class II 6.6% risk of cardiac complication. Given his past medical history, he would be a intermediate risk patient for the procedure.   Patient is also adament that Dr. Ronnald Ramp told him to hold eliquis for 5 days prior to the procedure. I explained to the patient that our pharmacist recommended holding the eliquis for 3 days. I spoke with Dr. Ronnald Ramp' surgery scheduler to verify. Surgery scheduler also mentions Dr. Ronnald Ramp wants the patient to hold eliquis for 5 days given high risk procedure near the neck area.   I will forward to Dr. Burt Knack for review regarding the duration to hold eliquis. 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

## 2018-11-03 ENCOUNTER — Encounter: Payer: Medicare Other | Admitting: Physical Therapy

## 2018-11-04 NOTE — Telephone Encounter (Signed)
   Primary Cardiologist: Sherren Mocha, MD  Chart reviewed as part of pre-operative protocol coverage. Patient was contacted 11/04/2018 in reference to pre-operative risk assessment for pending surgery as outlined below.  [name] was last seen on 10/11/2018 by Dr. Fletcher Anon.  Since that day, [name] has done well and doing physical therapy for 4 hours a week and has been able to exercise without exertional chest pain or worsening dyspnea. His RCRI risk is class II 6.6% risk of cardiac complication. Given his past medical history, he would be a intermediate risk patient for the procedure.  Therefore, based on ACC/AHA guidelines, the patient would be at acceptable risk for the planned procedure without further cardiovascular testing.   I will route this recommendation to the requesting party via Epic fax function and remove from pre-op pool.   Please call with questions.  I have discussed with Dr. Burt Knack, per Dr. Burt Knack, ok to hold eliquis for 5 days which is longer than the recommended holding of 3 days by our clinical pharmacist. Dr. Ronnald Ramp was concerned of bleeding risk due to the high risk nature of the procedure.   [place], [place] 11/04/2018, 12:30 PM

## 2018-11-04 NOTE — Pre-Procedure Instructions (Signed)
[name]  11/04/2018    Your procedure is scheduled on Wednesdayy, September 30.  Report to [facility], Main Entrance or Entrance "A" at 8:00 AM                  Your surgery or procedure is scheduled for  10:00  A.M.   Call this number if you have problems the morning of surgery: [phone]  This is the number for the Pre- Surgical Desk.                 For any other questions, please call [phone], Monday - Friday 8 AM - 4 PM.   Remember:  Do not eat or drink after midnight Tuesday, September 29.    Take these medicines the morning of surgery with A SIP OF WATER:  isosorbide mononitrate (IMDUR)             predniSONE (DELTASONE)             pyridostigmine (MESTINON)             rosuvastatin (CRESTOR)             Eye drops  Take if needed: gabapentin (NEURONTIN)  oxycodone oroxyCODONE-acetaminophen (PERCOCET/ROXICET)  Follow your surgeons instructions regarding Eliquis  Special instructions:  [name]- Preparing For Surgery  Before surgery, you can play an important role. Because skin is not sterile, your skin needs to be as free of germs as possible. You can reduce the number of germs on your skin by washing with CHG (chlorahexidine gluconate) Soap before surgery.  CHG is an antiseptic cleaner which kills germs and bonds with the skin to continue killing germs even after washing.    Oral Hygiene is also important to reduce your risk of infection.  Remember - BRUSH YOUR TEETH THE MORNING OF SURGERY WITH YOUR REGULAR TOOTHPASTE  Please do not use if you have an allergy to CHG or antibacterial soaps. If your skin becomes reddened/irritated stop using the CHG.  Do not shave (including legs and underarms) for at least 48 hours prior to first CHG shower. It is OK to shave your face.  Please follow these instructions carefully.   1. Shower the NIGHT BEFORE SURGERY and the MORNING OF SURGERY with CHG.   2. If you chose to wash your hair, wash your hair  first as usual with your normal shampoo.  3. After you shampoo, wash your face and private area with the soap you use at home, then rinse your hair and body thoroughly to remove the shampoo and soap.   4. Use CHG as you would any other liquid soap. You can apply CHG directly to the skin and wash gently with a scrungie or a clean washcloth.   5. Apply the CHG Soap to your body ONLY FROM THE NECK DOWN.  Do not use on open wounds or open sores. Avoid contact with your eyes, ears, mouth and genitals (private parts).   6. Wash thoroughly, paying special attention to the area where your surgery will be performed.  7. Thoroughly rinse your body with warm water from the neck down.  8. DO NOT shower/wash with your normal soap after using and rinsing off the CHG Soap.  9. Pat yourself dry with a CLEAN TOWEL.  10. Wear CLEAN PAJAMAS to bed the night before surgery, wear comfortable clothes the morning of surgery  11. Place CLEAN SHEETS on your bed the night of your first shower  and DO NOT SLEEP WITH PETS.  Day of Surgery: Shower as instructed above. Do not wear lotions, powders, or colognes, or deodorant. Please wear clean clothes to the hospital/surgery center.   Remember to brush your teeth WITH YOUR REGULAR TOOTHPASTE.    Do not wear jewelry, make-up or nail polish.   Men may shave face and neck.  Do not bring valuables to the hospital.  [facility] is not responsible for any belongings or valuables.  Contacts, dentures or bridgework may not be worn into surgery.  Leave your suitcase in the car.  After surgery it may be brought to your room.  For patients admitted to the hospital, discharge time will be determined by your treatment team.  Patients discharged the day of surgery will not be allowed to drive home.   Please read over the following fact sheets that you were given: Coughing and Deep Breathing, Pain Booklet,  Surgical Site Infections

## 2018-11-04 NOTE — Telephone Encounter (Signed)
Called the patient and informed him of holing his Eliquis for 5 days prior to the procedure. Patient stated that he has begun holding his Eliquis today since it is the start of day 5.

## 2018-11-06 NOTE — Telephone Encounter (Signed)
Agree. thanks

## 2018-11-07 ENCOUNTER — Other Ambulatory Visit: Payer: Self-pay

## 2018-11-07 ENCOUNTER — Encounter (HOSPITAL_COMMUNITY)
Admission: RE | Admit: 2018-11-07 | Discharge: 2018-11-07 | Disposition: A | Discharge status: Home / Self Care | Payer: Medicare Other | Source: Ambulatory Visit | Attending: Neurological Surgery | Admitting: Neurological Surgery

## 2018-11-07 ENCOUNTER — Ambulatory Visit (HOSPITAL_COMMUNITY)
Admission: RE | Admit: 2018-11-07 | Discharge: 2018-11-07 | Disposition: A | Discharge status: Home / Self Care | Payer: Medicare Other | Source: Ambulatory Visit | Attending: Neurological Surgery | Admitting: Neurological Surgery

## 2018-11-07 ENCOUNTER — Encounter (HOSPITAL_COMMUNITY): Payer: Self-pay

## 2018-11-07 ENCOUNTER — Encounter: Payer: Medicare Other | Admitting: Physical Therapy

## 2018-11-07 ENCOUNTER — Other Ambulatory Visit (HOSPITAL_COMMUNITY)
Admission: RE | Admit: 2018-11-07 | Discharge: 2018-11-07 | Disposition: A | Discharge status: Home / Self Care | Payer: Medicare Other | Source: Ambulatory Visit | Attending: Neurological Surgery | Admitting: Neurological Surgery

## 2018-11-07 DIAGNOSIS — K219 Gastro-esophageal reflux disease without esophagitis: Secondary | ICD-10-CM | POA: Insufficient documentation

## 2018-11-07 DIAGNOSIS — Z7951 Long term (current) use of inhaled steroids: Secondary | ICD-10-CM | POA: Insufficient documentation

## 2018-11-07 DIAGNOSIS — Z01818 Encounter for other preprocedural examination: Secondary | ICD-10-CM | POA: Insufficient documentation

## 2018-11-07 DIAGNOSIS — C9210 Chronic myeloid leukemia, BCR/ABL-positive, not having achieved remission: Secondary | ICD-10-CM | POA: Insufficient documentation

## 2018-11-07 DIAGNOSIS — G8929 Other chronic pain: Secondary | ICD-10-CM | POA: Insufficient documentation

## 2018-11-07 DIAGNOSIS — G4700 Insomnia, unspecified: Secondary | ICD-10-CM | POA: Diagnosis not present

## 2018-11-07 DIAGNOSIS — M810 Age-related osteoporosis without current pathological fracture: Secondary | ICD-10-CM | POA: Insufficient documentation

## 2018-11-07 DIAGNOSIS — Z86711 Personal history of pulmonary embolism: Secondary | ICD-10-CM | POA: Insufficient documentation

## 2018-11-07 DIAGNOSIS — I129 Hypertensive chronic kidney disease with stage 1 through stage 4 chronic kidney disease, or unspecified chronic kidney disease: Secondary | ICD-10-CM | POA: Insufficient documentation

## 2018-11-07 DIAGNOSIS — Z7901 Long term (current) use of anticoagulants: Secondary | ICD-10-CM | POA: Insufficient documentation

## 2018-11-07 DIAGNOSIS — E782 Mixed hyperlipidemia: Secondary | ICD-10-CM | POA: Insufficient documentation

## 2018-11-07 DIAGNOSIS — N189 Chronic kidney disease, unspecified: Secondary | ICD-10-CM | POA: Insufficient documentation

## 2018-11-07 DIAGNOSIS — E1122 Type 2 diabetes mellitus with diabetic chronic kidney disease: Secondary | ICD-10-CM | POA: Insufficient documentation

## 2018-11-07 DIAGNOSIS — Z86718 Personal history of other venous thrombosis and embolism: Secondary | ICD-10-CM | POA: Insufficient documentation

## 2018-11-07 DIAGNOSIS — E1151 Type 2 diabetes mellitus with diabetic peripheral angiopathy without gangrene: Secondary | ICD-10-CM | POA: Insufficient documentation

## 2018-11-07 DIAGNOSIS — Z7952 Long term (current) use of systemic steroids: Secondary | ICD-10-CM | POA: Insufficient documentation

## 2018-11-07 DIAGNOSIS — M4802 Spinal stenosis, cervical region: Secondary | ICD-10-CM | POA: Insufficient documentation

## 2018-11-07 DIAGNOSIS — H42 Glaucoma in diseases classified elsewhere: Secondary | ICD-10-CM | POA: Insufficient documentation

## 2018-11-07 DIAGNOSIS — Z791 Long term (current) use of non-steroidal anti-inflammatories (NSAID): Secondary | ICD-10-CM | POA: Insufficient documentation

## 2018-11-07 DIAGNOSIS — I739 Peripheral vascular disease, unspecified: Secondary | ICD-10-CM | POA: Diagnosis not present

## 2018-11-07 DIAGNOSIS — G7000 Myasthenia gravis without (acute) exacerbation: Secondary | ICD-10-CM | POA: Insufficient documentation

## 2018-11-07 DIAGNOSIS — F40240 Claustrophobia: Secondary | ICD-10-CM | POA: Insufficient documentation

## 2018-11-07 DIAGNOSIS — I2510 Atherosclerotic heart disease of native coronary artery without angina pectoris: Secondary | ICD-10-CM | POA: Insufficient documentation

## 2018-11-07 DIAGNOSIS — Z01812 Encounter for preprocedural laboratory examination: Secondary | ICD-10-CM | POA: Insufficient documentation

## 2018-11-07 DIAGNOSIS — Z20828 Contact with and (suspected) exposure to other viral communicable diseases: Secondary | ICD-10-CM | POA: Insufficient documentation

## 2018-11-07 DIAGNOSIS — Z8673 Personal history of transient ischemic attack (TIA), and cerebral infarction without residual deficits: Secondary | ICD-10-CM | POA: Insufficient documentation

## 2018-11-07 DIAGNOSIS — Z89611 Acquired absence of right leg above knee: Secondary | ICD-10-CM | POA: Insufficient documentation

## 2018-11-07 DIAGNOSIS — H409 Unspecified glaucoma: Secondary | ICD-10-CM | POA: Insufficient documentation

## 2018-11-07 DIAGNOSIS — Z79899 Other long term (current) drug therapy: Secondary | ICD-10-CM | POA: Insufficient documentation

## 2018-11-07 DIAGNOSIS — E1139 Type 2 diabetes mellitus with other diabetic ophthalmic complication: Secondary | ICD-10-CM | POA: Insufficient documentation

## 2018-11-07 HISTORY — DX: Peripheral vascular disease, unspecified: I73.9

## 2018-11-07 HISTORY — DX: Personal history of other medical treatment: Z92.89

## 2018-11-07 HISTORY — DX: Chronic kidney disease, unspecified: N18.9

## 2018-11-07 LAB — BASIC METABOLIC PANEL
Anion gap: 12 (ref 5–15)
BUN: 12 mg/dL (ref 8–23)
CO2: 30 mmol/L (ref 22–32)
Calcium: 8.8 mg/dL — ABNORMAL LOW (ref 8.9–10.3)
Chloride: 94 mmol/L — ABNORMAL LOW (ref 98–111)
Creatinine, Ser: 1.86 mg/dL — ABNORMAL HIGH (ref 0.61–1.24)
GFR calc Af Amer: 39 mL/min — ABNORMAL LOW (ref >60)
GFR calc non Af Amer: 33 mL/min — ABNORMAL LOW (ref >60)
Glucose, Bld: 108 mg/dL — ABNORMAL HIGH (ref 70–99)
Potassium: 3.6 mmol/L (ref 3.5–5.1)
Sodium: 136 mmol/L (ref 135–145)

## 2018-11-07 LAB — CBC WITH DIFFERENTIAL/PLATELET
Abs Immature Granulocytes: 0.01 10*3/uL (ref 0.00–0.07)
Basophils Absolute: 0 10*3/uL (ref 0.0–0.1)
Basophils Relative: 1 %
Eosinophils Absolute: 0.2 10*3/uL (ref 0.0–0.5)
Eosinophils Relative: 3 %
HCT: 29.1 % — ABNORMAL LOW (ref 39.0–52.0)
Hemoglobin: 9.9 g/dL — ABNORMAL LOW (ref 13.0–17.0)
Immature Granulocytes: 0 %
Lymphocytes Relative: 24 %
Lymphs Abs: 1.6 10*3/uL (ref 0.7–4.0)
MCH: 31 pg (ref 26.0–34.0)
MCHC: 34 g/dL (ref 30.0–36.0)
MCV: 91.2 fL (ref 80.0–100.0)
Monocytes Absolute: 0.9 10*3/uL (ref 0.1–1.0)
Monocytes Relative: 13 %
Neutro Abs: 4 10*3/uL (ref 1.7–7.7)
Neutrophils Relative %: 59 %
Platelets: 324 10*3/uL (ref 150–400)
RBC: 3.19 MIL/uL — ABNORMAL LOW (ref 4.22–5.81)
RDW: 16.1 % — ABNORMAL HIGH (ref 11.5–15.5)
WBC: 6.7 10*3/uL (ref 4.0–10.5)
nRBC: 0 % (ref 0.0–0.2)

## 2018-11-07 LAB — SURGICAL PCR SCREEN
MRSA, PCR: NEGATIVE
Staphylococcus aureus: NEGATIVE

## 2018-11-07 LAB — PROTIME-INR
INR: 1.1 (ref 0.8–1.2)
Prothrombin Time: 14 seconds (ref 11.4–15.2)

## 2018-11-07 LAB — HEMOGLOBIN A1C
Hgb A1c MFr Bld: 5.8 % — ABNORMAL HIGH (ref 4.8–5.6)
Mean Plasma Glucose: 119.76 mg/dL

## 2018-11-07 LAB — SARS CORONAVIRUS 2 (TAT 6-24 HRS): SARS Coronavirus 2: NEGATIVE

## 2018-11-07 NOTE — Progress Notes (Signed)
PCP - Dr Alfonso Ramus  Cardiologist - Dr. Burt Knack  Chest x-ray - 11/07/2018  EKG - 08/15/2018  Stress Test - 2017  ECHO - 2019  Cardiac Cath - no  Sleep Study - no CPAP - no  LABS-CBC with Diff, BMP, PT  ASA-no Eliquis- hold 5 days, patient stopped    ERAS-no  HA1C-11/07/2018 Hx of DMII  Medication has been stopped by Dr Loanne Drilling, A1C run 5.7- 6.2  Fasting Blood Sugar - doesn't check Checks Blood Sugar __0___ times a day  Anesthesia- BP low on arrival to PAT 98/ recheck using manual cuff 92/38. Patient denies dizziness or lightheadedness. Patient took Percocet for pain this am. I asked Mrs Wedemeyer to join [place], she assist with patient's medications. Mrs. Caba reports that patient's blood pressure run low at times, but, not that low. Mrs. Hagans reports that patient hasn't eaten much in the past two days and that patient vomited this am.  Mrs Kocourek reports that they have a blood pressure machine, but have not taken blood pressure recently. I informed Myra Gianotti of above information Pt denies having chest pain, sob, or fever at this time. All instructions explained to the pt, with a verbal understanding of the material. Pt agrees to go over the instructions while at home for a better understanding. Pt also instructed to self quarantine after being tested for COVID-19. The opportunity to ask questions was provided.

## 2018-11-07 NOTE — Progress Notes (Signed)
Anesthesia PAT Evaluation:  Case: [id number] Date/Time: 11/09/18 0945   Procedures:      Corpectomy C5 (N/A )     ACDF C3-4 (N/A )   Anesthesia type: General   Pre-op diagnosis: Stenosis   Location: [facility] OR ROOM 20 / [facility]   Surgeon: [name], MD      DISCUSSION: Patient is an 78 year old male scheduled for the above procedure. I evaluated him at his 11/07/18 PAT visit.   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 Initially planned for surgery during that admission for severe neck pain but also diagnosed with septic arthritis of left sternoclavicular joint, so surgery postponed until infection cleared. He later underwent left ulnar nerve decompression and left carpal tunnel release on 8/1/420, but unfortunately, did not notice any significant improvement in his symptoms. Unfortunately, now developing some paresthesias ("pins and needles") and weakness in his right hand as well. He is unable to raise his LUE and has had persistent LUE edema since his August surgery, worse in the hand, although admits that he is not really able to keep his arm elevated. He says Dr. Ronnald Ramp thinks swelling may be related to his neck issues. He is on Eliquis for DVT/PE history, with last dose on 11/03/18. 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 He spends most of his time in a wheelchair. He has had chronic LLE edema since his right AKA, and takes Lasix 40 mg daily. Negative LLE DVT 10/08/18.  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  BP readings at his PAT visit were low at 94/38, 98/40, 92/38. BP taken in his RUE due to LUE edema present since August surgery. 1+ radial pulse right, 1-2+ left. I attempted to get a manual BP on his LUE, but could not definitively hear Korotkoff sounds with my stethoscope, but based on gauge movements, I believe BP was higher than manual measurements. Diminished right radial and brachial pulses may be contributing factor. He had to transfer for CXR and did not have any dizziness with that. He did report that he took Mestinon, prednisone, Lasix, Imdur, and Percocet 5/325 (4 AM 11/07/18 and also at 11 PM 11/06/18) the morning of PAT. Last Neurontin 11/06/18 at 6:00 PM. He also reported decreased po intake on 11/07/18 due to N/V episode which he attributed to being nervous about upcoming surgery and also side effect of Gleevac which he did not take at noon as usual due to vomiting. He did not look acutely ill at his PAT visit. Automated  BP 125/48, HR 68 prior to leaving PAT.  His wife was with him at his appointment.   Preoperative cardiology input as outlined by Almyra Deforest, [place] on 10/31/18 classifies patient as "intermediate risk" but no further cardiovascular testing recommended. Per Dr. Burt Knack, okay to hold Eliquis for 5 days prior to  surgery.  Preoperative labs showed Cr 1.86 (up from 1.74 on 10/08/18). He does not see a nephrologist, but has had known elevated Creatinine, recent lowest of 1.22 on 03/16/18 and highest 2.38 12/26/17, but overall ~ 1.40-2.00 since 12/2017. H/H 9.9/29.1, but not significantly changed from 10/08/18 (10.6/33.0).   Patient with low BP readings at PAT, but as above not sure readings are accurate---although he is on several medications that could contribute to hypotension, and he also had decreased po intake due to N/V x 1 on 11/07/18. No symptomatic hypotension. Could consider arterial line for BP monitoring if needed intraoperatively. DBP ~ 50 during his August procedure. In regards to N/V episodes, he does not look acutely ill. He feels his nerves are contributing. I did discuss that should he develop persistent/new symptoms (ie, abdominal pain, worsening N/V, SOB, fever) then he should be further evaluated prior to surgery. I updated Vanessa at Dr. Ronnald Ramp' office regarding BP, persistent LUE swelling, abnormal labs. Will order a T&S given anemia. I also reviewed case with anesthesiologist Renold Don, MD. Anesthesia team to re-evaluate on the day of surgery.     VS: BP (!) 92/38 Comment: rechecked manually/notified Jan RN  Pulse 76   Temp 36.9 C   Resp 18   Ht 5\' 6"  (1.676 m)   SpO2 97%   BMI 23.89 kg/m  Recheck BP 125/45. (BP 144/69 10/11/18, 142/60 10/08/18)   PROVIDERS: Libby Maw, MD is PCP - Jill Alexanders, MD is neurologist. Last visit 09/13/18.  Jerrye Noble, MD is HEM-[facility] ([place]). He also sees "Dr. Elease Etienne" at the [facility].  Sherren Mocha, MD is primary cardiologist. Last seen by Richardson Dopp, PA-C on 03/16/2018. [name], MD is [facility] cardiologist. He was seen on 10/11/18 with LLE edema noted as well as severe left inguinal and thigh pain. Palpable femoral pulse noted and [place] negative for DVT. He was referred back to primary care and neurosurgery  for evaluation possible spinal etiology of his symptoms.  - [name], MD is vascular surgeon   LABS: Preoperative labs noted. See DISCUSSION. (all labs ordered are listed, but only abnormal results are displayed)  Labs Reviewed  BASIC METABOLIC PANEL - Abnormal; Notable for the following components:      Result Value   Chloride 94 (*)    Glucose, Bld 108 (*)    Creatinine, Ser 1.86 (*)    Calcium 8.8 (*)    GFR calc non Af Amer 33 (*)    GFR calc Af Amer 39 (*)    All other components within normal limits  CBC WITH DIFFERENTIAL/PLATELET - Abnormal; Notable for the following components:   RBC 3.19 (*)    Hemoglobin 9.9 (*)    HCT 29.1 (*)    RDW 16.1 (*)    All other components within normal limits  HEMOGLOBIN A1C - Abnormal; Notable for the following components:   Hgb A1c MFr Bld 5.8 (*)    All other components within normal limits  SURGICAL PCR SCREEN  PROTIME-INR     IMAGES: MRI L-spine 09/04/2018: IMPRESSION: 1. Severe lumbar spine degeneration without noted progression since study 1 year ago. 2. L2-3  moderate spinal stenosis with right more than left subarticular recess impingement. Severe right foraminal impingement. 3. L3-4 right subarticular recess stenosis. 4. L4-5 severe left foraminal impingement. Moderate spinal stenosis with impingement of both subarticular recesses. 5. L5-S1 advanced right foraminal impingement.   MRI C-spine 08/27/2018: IMPRESSION: - Similar appearance to the study of last year. - C1-2: Arthropathy but without apparent neural compression. - C2-3: Spondylosis and facet arthropathy worse on the left. Left foraminal narrowing could affect the C3 nerve. - C3-4: Endplate osteophytes and shallow disc herniation more prominent towards the left. Facet arthropathy. Canal diameter 8.5 mm in the midline. Bilateral foraminal stenosis could compress either C4 nerve. - C4-5: Large disc herniation with caudal migration down behind  C5. Severe spinal stenosis with canal narrowing and AP diameter as narrow as 4.5 mm. Cord deformity without clear T2 signal. None the less, the patient would be at considerable risk of myelopathy based on this finding. Bilateral facet arthropathy. Bilateral foraminal stenosis likely to compress the C5 nerves. - C5-6: Disc bulge. Facet degeneration. Mild bilateral foraminal narrowing. - C6-7: Left-sided predominant spondylosis and facet arthropathy. Left foraminal narrowing could affect the C7 nerve. - C7-T1: Facet arthropathy with 3 mm of anterolisthesis. Disc herniation with upward migration. Canal narrowing but no cord compression. Bilateral foraminal narrowing could affect either C8 nerve. - T1-2: Right foraminal stenosis because of osteophyte in disc material could affect the right T1 nerve.   CTA Ao+BiFem 08/15/2018: IMPRESSION: - No acute arterial abnormality. - Left-sided moderate femoropopliteal atherosclerotic changes, with no evidence of occlusion to the knee. There is evidence of developing stenosis in the popliteal artery at the knee joint secondary to calcified plaque. 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 There is estimated 50% narrowing of the right common femoral artery secondary to focal calcified plaque. - Evaluation of the left-sided tibial arteries is significantly limited by the advanced medial calcinosis, and patency of the left tibial arteries beyond the trifurcation cannot be assessed. - Aortic atherosclerosis. Aortic Atherosclerosis (ICD10-I70.0). Associated mild bilateral iliac arterial disease with no high-grade stenosis or occlusion.   EKG: 08/15/2018: Sinus or ectopic atrial rhythm Inferior infarct, old afib no longer present compared to 2019 Confirmed by Sherwood Gambler [phone]) on 08/15/2018 11:59:10 AM   CV: LLE venous US 10/08/2018 Summary: Left: Findings appear essentially unchanged compared to previous examination. There  is no evidence of deep vein thrombosis in the lower extremity.   Echo 07/20/2017 Study Conclusions - Left ventricle: The cavity size was normal. There was mild   concentric hypertrophy. Systolic function was normal. The   estimated ejection fraction was in the range of 50% to 55%. Wall   motion was normal; there were no regional wall motion   abnormalities. Doppler parameters are consistent with abnormal   left ventricular relaxation (grade 1 diastolic dysfunction).   Doppler parameters are consistent with elevated ventricular   end-diastolic filling pressure. - Aortic valve: There was mild stenosis. There was trivial   regurgitation. Valve area (VTI): 2.08 cm^2. Valve area (Vmax):   1.86 cm^2. Valve area (Vmean): 1.81 cm^2. - Mitral valve: There was mild regurgitation. - Right ventricle: The cavity size was normal. Wall thickness was   normal. Systolic function was normal. - Right atrium: The atrium was normal in size. - Pulmonary arteries: Systolic pressure was within the normal   range. - Inferior vena cava: The vessel was normal in size. - Pericardium, extracardiac: There was no pericardial effusion. Impressions: - No cardiac source  of emboli was indentified.   Carotid US 02/26/2017 Final Interpretation: Right Carotid: Velocities in the right ICA are consistent with a 40-59% stenosis. Left Carotid: Velocities in the left ICA are consistent with a 1-39% stenosis. Vertebrals: Right vertebral artery was patent with antegrade flow. No evidenceof flow detected in the left vertebral artery. Subclavians: Normal flow hemodynamics were seen in bilateral subclavianarteries.   Nuclear stress test 09/27/2015  Study Highlights The left ventricular ejection fraction is mildly decreased (45-54%).  Nuclear stress EF: 50%.  There was no ST segment deviation noted during stress.  The study is normal.  This is a low risk study. Low risk stress nuclear study with normal perfusion and  normal left ventricular regional and global systolic function.   Cardiac cath 03/17/01: ANGIOGRAPHIC FINDINGS: 1. The left main coronary artery has approximately 10-20% stenosis, but    no significant flow-limiting lesions. 2. The left anterior descending is a medium caliber vessel that provides    three diagonal branches. There is scattered 20% stenosis in the proximal    to mid vessel with 30% more distal stenosis followed by an area of    50-60% stenosis towards the apex. Flow is TIMI-3 throughout this vessel. 3. The circumflex coronary artery essentially provides one large obtuse    marginal branch then has a 20% stenosis. 4. The right coronary artery is a dominant vessel that essentially has two    branches in the posterior descending distribution. There was a 30%    distal RCA stenosis and a focal 95% stenosis in the smaller of the two    branches in the posterior descending territory. LEFT VENTRICULOGRAM: The left ventriculography reveals an ejection fraction estimated at 60-65% with no focal wall motion abnormalities and no mitral regurgitation. DIAGNOSES: 1. Distal and branch vessel coronary artery disease as outlined. 2. Normal left ventricular contraction estimated at 60-65% with no    significant mitral regurgitation. RECOMMENDATIONS: Would continue to titrate medical therapy, specifically anti-ischemic therapy. Will add Imdur 30 mg p.o. q.h.s. The small branch in the PDA territory is not particularly amenable to percutaneous coronary intervention. The distal LAD lesion could potentially become a problem. If patients symptoms are not controlled with medical therapy, would suggest a repeat myocardial perfusion study to help define any potential area of ischemia and help guide revascularization options. Rozann Lesches, MD)    Past Medical History:  Diagnosis Date  . Bruises easily    d/t being on Eliquis  . Carotid artery disease ([place])    a. Duplex 05/2014: 123456  RICA, 123456 LICA, elevated velocities in right subclavian artery, normal left subclavian artery..  . Chronic back pain    HNP  . Chronic kidney disease (CKD)   . Claustrophobia    takes Ativan as needed  . CML (chronic myeloid leukemia) ([place])    takes Gleevec daily  . Coronary atherosclerosis of unspecified type of vessel, native or graft    a. cath in 2003 showed 10-20% LM, scattered 20% prox-mid LAD, 30% more distal LAD, 50-60% stenosis of LAD towards apex, 20% Cx, 30% dRCA, focal 95% stenosis in smaller of 2 branches of PDA, EF 60-65%.  . Diabetes mellitus ([place])    no longer- cancxelled by Dr Hale Bogus  . Diarrhea    takes Imodium daily as needed  . Diverticulosis   . Diverticulosis of colon (without mention of hemorrhage)   . DJD (degenerative joint disease)   . DVT (deep venous thrombosis) ([place]) 07/2011  . Esophageal stricture   .  Essential hypertension    takes Imdur and Lisinopril daily  . Gastric polyps    benign  . GERD (gastroesophageal reflux disease)    takes Nexium daily  . Glaucoma    uses eye drops  . History of blood transfusion `   leg amputation  . History of bronchitis    not sure when the last time  . History of colon polyps    benign  . History of kidney stones    litrotrispspy  . History of kidney stones    passed  . Insomnia    takes Melatonin nightly as needed  . Mixed hyperlipidemia    takes Crestor daily  . Myasthenia gravis ([place])    uses prednisone  . Osteoporosis 2016  . Peripheral edema    takes Lasix daily  . Peripheral vascular disease ([place])   . Pituitary tumor    [facility]Walden at [facility] checks it every Jan   . Pneumonia 01/2016  . PONV (postoperative nausea and vomiting)   . Ptosis   . Pulmonary embolus ([place]) 2012  . Stroke ([place]) 06/2015   x 3- last one 2018- balance issuses    Past Surgical History:  Procedure Laterality Date  . ABDOMINAL AORTOGRAM W/LOWER EXTREMITY N/A 11/18/2016   Procedure: ABDOMINAL AORTOGRAM W/LOWER  EXTREMITY;  Surgeon: [name], MD;  Location: [place] [facility];  Service: Cardiovascular;  Laterality: N/A;  . AMPUTATION Right 02/07/2018   Procedure: right leg AMPUTATION ABOVE KNEE;  Surgeon: [name], MD;  Location: [place];  Service: Vascular;  Laterality: Right;  . AORTOGRAM Right 12/23/2017   Procedure: AORTOGRAM, RIGHT LOWER EXTREMITY ANGIOGRAM, RIGHT SUPERFICIAL FEMORAL ARTERY ANGIOPLASTY WITH STENT, RIGHT POPLITEAL ARTERY ANGIOPLASTY WITH STENT, LEFT GROIN CUTDOWN AND LEFT FEMORAL ARTERY REPAIR;  Surgeon: [name], MD;  Location: [place];  Service: Vascular;  Laterality: Right;  . BACK SURGERY     x2, lumbar and cervical  . CARDIAC CATHETERIZATION    . CARPAL TUNNEL RELEASE Left 09/23/2018   Procedure: CARPAL TUNNEL RELEASE;  Surgeon: [name], MD;  Location: [place];  Service: Neurosurgery;  Laterality: Left;  CARPAL TUNNEL RELEASE  . CATARACT EXTRACTION Bilateral 12/12  . COLONOSCOPY    . ESOPHAGOGASTRODUODENOSCOPY ENDOSCOPY  04/2015  . IR GENERIC HISTORICAL  03/03/2016   IR IVC FILTER PLMT / S&I [name] GUID/MOD SED 03/03/2016 [name], MD [facility]  . IR GENERIC HISTORICAL  04/09/2016   IR RADIOLOGIST EVAL & MGMT 04/09/2016 [name], MD [facility] INTER[facility] RAD  . IR GENERIC HISTORICAL  04/30/2016   IR IVC FILTER RETRIEVAL / S&I [name] GUID/MOD SED 04/30/2016 [name], MD [facility]-INTER[facility] RAD  . LITHOTRIPSY    . LUMBAR LAMINECTOMY/DECOMPRESSION MICRODISCECTOMY Right 09/14/2012   Procedure: Right Lumbar three-four, four-five, Lumbar five-Sacral one decompressive laminectomy;  Surgeon: [name], MD;  Location: [place] [facility];  Service: Neurosurgery;  Laterality: Right;  . LUMBAR LAMINECTOMY/DECOMPRESSION MICRODISCECTOMY Left 03/11/2016   Procedure: Left Lumbar Two-ThreeMicrodiscectomy;  Surgeon: [name], MD;  Location: [place];  Service: Neurosurgery;  Laterality: Left;  . PERIPHERAL VASCULAR INTERVENTION  11/18/2016   Procedure: PERIPHERAL VASCULAR  INTERVENTION;  Surgeon: [name], MD;  Location: [place] [facility];  Service: Cardiovascular;;  Left popliteal  . SHOULDER SURGERY Left 05/07/2009  . TONSILLECTOMY    . ULNAR NERVE TRANSPOSITION Left 09/23/2018   Procedure: ULNAR NERVE DECOMPRESSION/TRANSPOSITION;  Surgeon: [name], MD;  Location: [place];  Service: Neurosurgery;  Laterality: Left;  ULNAR NERVE DECOMPRESSION/TRANSPOSITION    MEDICATIONS: . brimonidine (ALPHAGAN) 0.2 % ophthalmic solution  . cholecalciferol (VITAMIN D) 1000 units tablet  . diclofenac sodium (VOLTAREN) 1 % GEL  . diphenoxylate-atropine (LOMOTIL) 2.5-0.025 MG tablet  . ELIQUIS 5 MG TABS tablet  . furosemide (LASIX) 40 MG tablet  . gabapentin (NEURONTIN) 100 MG capsule  . HYDROcodone-acetaminophen (NORCO/VICODIN) 5-325 MG tablet  . imatinib (GLEEVEC) 100 MG tablet  . isosorbide mononitrate (IMDUR) 30 MG 24 hr tablet  . lidocaine (LIDODERM) 5 %  . loperamide (IMODIUM) 2 MG capsule  . Multiple Vitamin (MULTIVITAMIN) tablet  . multivitamin-lutein (OCUVITE-LUTEIN) CAPS capsule  . oxycodone (OXY-IR) 5 MG capsule  . oxyCODONE-acetaminophen (PERCOCET/ROXICET) 5-325 MG tablet  . predniSONE (DELTASONE) 1 MG tablet  . pyridostigmine (MESTINON) 60 MG tablet  . rosuvastatin (CRESTOR) 10 MG tablet  . timolol (BETIMOL) 0.5 % ophthalmic solution   No current facility-administered medications for this encounter.     Myra Gianotti, PA-C Surgical Short Stay/Anesthesiology [facility] Phone [phone] [facility] Phone [phone] 11/08/2018 9:38 AM

## 2018-11-08 NOTE — Anesthesia Preprocedure Evaluation (Addendum)
Anesthesia Evaluation  Patient identified by MRN, date of birth, ID band Patient awake    Reviewed: Allergy & Precautions, NPO status , Patient's Chart, lab work & pertinent test results  History of Anesthesia Complications (+) PONV and history of anesthetic complications  Airway Mallampati: III  TM Distance: >3 FB Neck ROM: Full  Mouth opening: Limited Mouth Opening  Dental no notable dental hx. 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 There was mild concentric hypertrophy. Systolic function was normal. The estimated ejection fraction was in the range of 50% to 55%. Wall motion was normal; there were no regional wall motionabnormalities. Doppler parameters are consistent with abnormal left ventricular relaxation (grade 1 diastolic dysfunction). Doppler parameters are consistent with elevated ventricular end-diastolic filling pressure. - Aortic valve: There was mild stenosis. There was trivial regurgitation. - Mitral valve: There was mild regurgitation. - Right ventricle: The cavity size was normal. Wall thickness was normal. Systolic function was normal.  Carotid US 02/26/2017 Final Interpretation: Right Carotid: Velocities in the right ICA are consistent with a 40-59% stenosis. Left Carotid: Velocities in the left ICA are consistent with a 1-39% stenosis. Vertebrals: Right vertebral artery was patent with antegrade flow. No evidenceof flow detected in the left vertebral artery. Subclavians:  Normal flow hemodynamics were seen in bilateral subclavianarteries.  Nuclear stress test 09/27/2015 Study Highlights The left ventricular ejection fraction is mildly decreased (45-54%). Nuclear stress EF: 50%. There was no ST segment deviation noted during stress. The study is normal. This is a low risk study. Low risk stress nuclear study with normal perfusion and normal left ventricular regional and global systolic function.   Cardiac cath 03/17/01: ANGIOGRAPHIC FINDINGS: 1. The left main coronary artery has approximately 10-20% stenosis, but no significant flow-limiting lesions. 2. The left anterior descending is a medium caliber vessel that provides three diagonal branches. There is scattered 20% stenosis in the proximal to mid vessel with 30% more distal stenosis followed by an area of 50-60% stenosis towards the apex. Flow is TIMI-3 throughout this vessel. 3. The circumflex coronary artery essentially provides one large obtuse marginal branch then has a 20% stenosis. 4. The right coronary artery is a dominant vessel that essentially has two branches in the posterior descending distribution. There was a 30% distal RCA stenosis and a focal 95% stenosis in the smaller of the two branches in the posterior descending territory. LEFT VENTRICULOGRAM: The left ventriculography reveals an ejection fraction estimated at 60-65% with no focal wall motion abnormalities and no mitral regurgitation. DIAGNOSES: 1. Distal and branch vessel coronary artery disease as outlined. 2. Normal left ventricular contraction estimated at 60-65% with no significant mitral regurgitation. RECOMMENDATIONS: Would continue to titrate medical therapy, specifically anti-ischemic therapy. Will add Imdur 30 mg p.o. q.h.s. The small branch in the PDA territory is not particularly amenable to percutaneous coronary intervention. The distal LAD lesion could potentially become a problem.  If patients symptoms are not controlled with medical therapy, would suggest a repeat myocardial perfusion study to help define any potential area of ischemia and help guide revascularization options. 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, no respiratory symptoms CVA    GI/Hepatic Neg liver ROS, GERD  ,  Endo/Other  negative endocrine ROSdiabetes  Renal/GU Renal InsufficiencyRenal disease (K 3.6, Cr 1.86)  negative genitourinary   Musculoskeletal  (+) Arthritis , Osteoarthritis,    Abdominal   Peds  Hematology  (+) Blood dyscrasia (Hgb 9.9, on eliquis), anemia , Chronic myeloid leukemia   Anesthesia Other Findings   Reproductive/Obstetrics                        Anesthesia Physical Anesthesia Plan  ASA: III  Anesthesia Plan: General   Post-op Pain Management:    Induction: Intravenous  PONV Risk Score and Plan: 3 and Dexamethasone and Ondansetron  Airway Management Planned: Oral ETT  Additional Equipment: Arterial line  Intra-op Plan:   Post-operative Plan: Extubation in OR  Informed Consent: I have reviewed the patients History and Physical, chart, labs and discussed the procedure including the risks, benefits and alternatives for the proposed anesthesia with the patient or authorized representative who has indicated his/her understanding and acceptance.     Dental advisory given  Plan Discussed with: CRNA  Anesthesia Plan Comments: (See PAT note by Myra Gianotti, PA-C. History ocular MG, PE/DVT (2013), distal CAD disease, CML, CVA (2017), PAD (right AKA 01/2018).  Preoperative cardiology risk assessment: "Intermediate risk" Last Eliquis 11/03/18.  Consider a-line for BP monitoring.  )       Anesthesia Quick Evaluation

## 2018-11-09 ENCOUNTER — Inpatient Hospital Stay (HOSPITAL_COMMUNITY): Payer: No Typology Code available for payment source

## 2018-11-09 ENCOUNTER — Encounter: Payer: Medicare Other | Admitting: Physical Therapy

## 2018-11-09 ENCOUNTER — Inpatient Hospital Stay (HOSPITAL_COMMUNITY): Payer: No Typology Code available for payment source | Admitting: Anesthesiology

## 2018-11-09 ENCOUNTER — Inpatient Hospital Stay (HOSPITAL_COMMUNITY)
Admission: RE | Disposition: A | Discharge status: Rehabilitation Facility | Payer: Self-pay | Source: Home / Self Care | Attending: Neurological Surgery

## 2018-11-09 ENCOUNTER — Encounter (HOSPITAL_COMMUNITY): Payer: Self-pay

## 2018-11-09 ENCOUNTER — Other Ambulatory Visit: Payer: Self-pay

## 2018-11-09 ENCOUNTER — Inpatient Hospital Stay (HOSPITAL_COMMUNITY): Payer: No Typology Code available for payment source | Admitting: Vascular Surgery

## 2018-11-09 ENCOUNTER — Inpatient Hospital Stay (HOSPITAL_COMMUNITY)
Admission: RE | Admit: 2018-11-09 | Discharge: 2018-11-11 | DRG: 472 | Disposition: A | Discharge status: Rehabilitation Facility | Payer: No Typology Code available for payment source | Attending: Neurological Surgery | Admitting: Neurological Surgery

## 2018-11-09 DIAGNOSIS — D631 Anemia in chronic kidney disease: Secondary | ICD-10-CM | POA: Diagnosis present

## 2018-11-09 DIAGNOSIS — M549 Dorsalgia, unspecified: Secondary | ICD-10-CM | POA: Diagnosis present

## 2018-11-09 DIAGNOSIS — E1122 Type 2 diabetes mellitus with diabetic chronic kidney disease: Secondary | ICD-10-CM | POA: Diagnosis present

## 2018-11-09 DIAGNOSIS — D62 Acute posthemorrhagic anemia: Secondary | ICD-10-CM | POA: Diagnosis not present

## 2018-11-09 DIAGNOSIS — E1151 Type 2 diabetes mellitus with diabetic peripheral angiopathy without gangrene: Secondary | ICD-10-CM | POA: Diagnosis present

## 2018-11-09 DIAGNOSIS — Z79899 Other long term (current) drug therapy: Secondary | ICD-10-CM

## 2018-11-09 DIAGNOSIS — Z419 Encounter for procedure for purposes other than remedying health state, unspecified: Secondary | ICD-10-CM

## 2018-11-09 DIAGNOSIS — Z23 Encounter for immunization: Secondary | ICD-10-CM | POA: Diagnosis not present

## 2018-11-09 DIAGNOSIS — N390 Urinary tract infection, site not specified: Secondary | ICD-10-CM | POA: Diagnosis not present

## 2018-11-09 DIAGNOSIS — I2510 Atherosclerotic heart disease of native coronary artery without angina pectoris: Secondary | ICD-10-CM | POA: Diagnosis present

## 2018-11-09 DIAGNOSIS — N1831 Chronic kidney disease, stage 3a: Secondary | ICD-10-CM | POA: Diagnosis not present

## 2018-11-09 DIAGNOSIS — G9520 Unspecified cord compression: Secondary | ICD-10-CM | POA: Diagnosis not present

## 2018-11-09 DIAGNOSIS — G4700 Insomnia, unspecified: Secondary | ICD-10-CM | POA: Diagnosis present

## 2018-11-09 DIAGNOSIS — M50021 Cervical disc disorder at C4-C5 level with myelopathy: Secondary | ICD-10-CM | POA: Diagnosis present

## 2018-11-09 DIAGNOSIS — M2578 Osteophyte, vertebrae: Secondary | ICD-10-CM | POA: Diagnosis present

## 2018-11-09 DIAGNOSIS — R739 Hyperglycemia, unspecified: Secondary | ICD-10-CM | POA: Diagnosis not present

## 2018-11-09 DIAGNOSIS — E46 Unspecified protein-calorie malnutrition: Secondary | ICD-10-CM | POA: Diagnosis present

## 2018-11-09 DIAGNOSIS — G7000 Myasthenia gravis without (acute) exacerbation: Secondary | ICD-10-CM | POA: Diagnosis not present

## 2018-11-09 DIAGNOSIS — N1832 Chronic kidney disease, stage 3b: Secondary | ICD-10-CM | POA: Diagnosis not present

## 2018-11-09 DIAGNOSIS — Z803 Family history of malignant neoplasm of breast: Secondary | ICD-10-CM

## 2018-11-09 DIAGNOSIS — E1165 Type 2 diabetes mellitus with hyperglycemia: Secondary | ICD-10-CM | POA: Diagnosis present

## 2018-11-09 DIAGNOSIS — N319 Neuromuscular dysfunction of bladder, unspecified: Secondary | ICD-10-CM | POA: Diagnosis present

## 2018-11-09 DIAGNOSIS — Z8673 Personal history of transient ischemic attack (TIA), and cerebral infarction without residual deficits: Secondary | ICD-10-CM

## 2018-11-09 DIAGNOSIS — S78111A Complete traumatic amputation at level between right hip and knee, initial encounter: Secondary | ICD-10-CM | POA: Diagnosis not present

## 2018-11-09 DIAGNOSIS — Z86718 Personal history of other venous thrombosis and embolism: Secondary | ICD-10-CM

## 2018-11-09 DIAGNOSIS — Z79891 Long term (current) use of opiate analgesic: Secondary | ICD-10-CM

## 2018-11-09 DIAGNOSIS — G959 Disease of spinal cord, unspecified: Secondary | ICD-10-CM | POA: Diagnosis not present

## 2018-11-09 DIAGNOSIS — M4802 Spinal stenosis, cervical region: Secondary | ICD-10-CM | POA: Diagnosis present

## 2018-11-09 DIAGNOSIS — Z823 Family history of stroke: Secondary | ICD-10-CM

## 2018-11-09 DIAGNOSIS — K219 Gastro-esophageal reflux disease without esophagitis: Secondary | ICD-10-CM | POA: Diagnosis present

## 2018-11-09 DIAGNOSIS — Z89611 Acquired absence of right leg above knee: Secondary | ICD-10-CM | POA: Diagnosis not present

## 2018-11-09 DIAGNOSIS — Z86711 Personal history of pulmonary embolism: Secondary | ICD-10-CM

## 2018-11-09 DIAGNOSIS — D649 Anemia, unspecified: Secondary | ICD-10-CM | POA: Diagnosis not present

## 2018-11-09 DIAGNOSIS — K592 Neurogenic bowel, not elsewhere classified: Secondary | ICD-10-CM | POA: Diagnosis present

## 2018-11-09 DIAGNOSIS — Z20828 Contact with and (suspected) exposure to other viral communicable diseases: Secondary | ICD-10-CM | POA: Diagnosis present

## 2018-11-09 DIAGNOSIS — M810 Age-related osteoporosis without current pathological fracture: Secondary | ICD-10-CM | POA: Diagnosis present

## 2018-11-09 DIAGNOSIS — R0989 Other specified symptoms and signs involving the circulatory and respiratory systems: Secondary | ICD-10-CM | POA: Diagnosis not present

## 2018-11-09 DIAGNOSIS — Z8639 Personal history of other endocrine, nutritional and metabolic disease: Secondary | ICD-10-CM | POA: Diagnosis not present

## 2018-11-09 DIAGNOSIS — F40240 Claustrophobia: Secondary | ICD-10-CM | POA: Diagnosis present

## 2018-11-09 DIAGNOSIS — Z8249 Family history of ischemic heart disease and other diseases of the circulatory system: Secondary | ICD-10-CM

## 2018-11-09 DIAGNOSIS — G8929 Other chronic pain: Secondary | ICD-10-CM | POA: Diagnosis present

## 2018-11-09 DIAGNOSIS — D497 Neoplasm of unspecified behavior of endocrine glands and other parts of nervous system: Secondary | ICD-10-CM | POA: Diagnosis present

## 2018-11-09 DIAGNOSIS — M4712 Other spondylosis with myelopathy, cervical region: Principal | ICD-10-CM | POA: Diagnosis present

## 2018-11-09 DIAGNOSIS — I10 Essential (primary) hypertension: Secondary | ICD-10-CM | POA: Diagnosis present

## 2018-11-09 DIAGNOSIS — K5730 Diverticulosis of large intestine without perforation or abscess without bleeding: Secondary | ICD-10-CM | POA: Diagnosis present

## 2018-11-09 DIAGNOSIS — N3941 Urge incontinence: Secondary | ICD-10-CM | POA: Diagnosis not present

## 2018-11-09 DIAGNOSIS — M48061 Spinal stenosis, lumbar region without neurogenic claudication: Secondary | ICD-10-CM | POA: Diagnosis present

## 2018-11-09 DIAGNOSIS — Z981 Arthrodesis status: Secondary | ICD-10-CM | POA: Diagnosis not present

## 2018-11-09 DIAGNOSIS — E782 Mixed hyperlipidemia: Secondary | ICD-10-CM | POA: Diagnosis present

## 2018-11-09 DIAGNOSIS — H409 Unspecified glaucoma: Secondary | ICD-10-CM | POA: Diagnosis present

## 2018-11-09 DIAGNOSIS — M1612 Unilateral primary osteoarthritis, left hip: Secondary | ICD-10-CM | POA: Diagnosis present

## 2018-11-09 DIAGNOSIS — E8809 Other disorders of plasma-protein metabolism, not elsewhere classified: Secondary | ICD-10-CM | POA: Diagnosis not present

## 2018-11-09 DIAGNOSIS — B961 Klebsiella pneumoniae [K. pneumoniae] as the cause of diseases classified elsewhere: Secondary | ICD-10-CM | POA: Diagnosis present

## 2018-11-09 DIAGNOSIS — Z886 Allergy status to analgesic agent status: Secondary | ICD-10-CM

## 2018-11-09 DIAGNOSIS — Z7952 Long term (current) use of systemic steroids: Secondary | ICD-10-CM | POA: Diagnosis not present

## 2018-11-09 DIAGNOSIS — N183 Chronic kidney disease, stage 3 unspecified: Secondary | ICD-10-CM | POA: Diagnosis present

## 2018-11-09 DIAGNOSIS — I129 Hypertensive chronic kidney disease with stage 1 through stage 4 chronic kidney disease, or unspecified chronic kidney disease: Secondary | ICD-10-CM | POA: Diagnosis present

## 2018-11-09 DIAGNOSIS — Z7901 Long term (current) use of anticoagulants: Secondary | ICD-10-CM

## 2018-11-09 DIAGNOSIS — Z993 Dependence on wheelchair: Secondary | ICD-10-CM | POA: Diagnosis not present

## 2018-11-09 DIAGNOSIS — Z4789 Encounter for other orthopedic aftercare: Secondary | ICD-10-CM | POA: Diagnosis not present

## 2018-11-09 DIAGNOSIS — M50022 Cervical disc disorder at C5-C6 level with myelopathy: Secondary | ICD-10-CM | POA: Diagnosis present

## 2018-11-09 DIAGNOSIS — M792 Neuralgia and neuritis, unspecified: Secondary | ICD-10-CM | POA: Diagnosis not present

## 2018-11-09 DIAGNOSIS — Z832 Family history of diseases of the blood and blood-forming organs and certain disorders involving the immune mechanism: Secondary | ICD-10-CM

## 2018-11-09 DIAGNOSIS — T380X5A Adverse effect of glucocorticoids and synthetic analogues, initial encounter: Secondary | ICD-10-CM | POA: Diagnosis not present

## 2018-11-09 DIAGNOSIS — B9689 Other specified bacterial agents as the cause of diseases classified elsewhere: Secondary | ICD-10-CM | POA: Diagnosis not present

## 2018-11-09 DIAGNOSIS — Z89612 Acquired absence of left leg above knee: Secondary | ICD-10-CM

## 2018-11-09 DIAGNOSIS — Z8719 Personal history of other diseases of the digestive system: Secondary | ICD-10-CM

## 2018-11-09 DIAGNOSIS — E0965 Drug or chemical induced diabetes mellitus with hyperglycemia: Secondary | ICD-10-CM | POA: Diagnosis not present

## 2018-11-09 DIAGNOSIS — C9210 Chronic myeloid leukemia, BCR/ABL-positive, not having achieved remission: Secondary | ICD-10-CM | POA: Diagnosis not present

## 2018-11-09 DIAGNOSIS — Z87442 Personal history of urinary calculi: Secondary | ICD-10-CM

## 2018-11-09 HISTORY — PX: ANTERIOR CERVICAL DECOMP/DISCECTOMY FUSION: SHX1161

## 2018-11-09 LAB — GLUCOSE, CAPILLARY
Glucose-Capillary: 112 mg/dL — ABNORMAL HIGH (ref 70–99)
Glucose-Capillary: 122 mg/dL — ABNORMAL HIGH (ref 70–99)
Glucose-Capillary: 155 mg/dL — ABNORMAL HIGH (ref 70–99)

## 2018-11-09 LAB — TYPE AND SCREEN
ABO/RH(D): A POS
Antibody Screen: NEGATIVE

## 2018-11-09 SURGERY — ANTERIOR CERVICAL DECOMPRESSION/DISCECTOMY FUSION 1 LEVEL
Anesthesia: General | Site: Spine Cervical

## 2018-11-09 MED ORDER — PHENYLEPHRINE 40 MCG/ML (10ML) SYRINGE FOR IV PUSH (FOR BLOOD PRESSURE SUPPORT)
PREFILLED_SYRINGE | INTRAVENOUS | Status: DC | PRN
  Administered 2018-11-09: 120 ug via INTRAVENOUS

## 2018-11-09 MED ORDER — ONDANSETRON HCL 4 MG/2ML IJ SOLN
INTRAMUSCULAR | Status: DC | PRN
  Administered 2018-11-09: 4 mg via INTRAVENOUS

## 2018-11-09 MED ORDER — LACTATED RINGERS IV SOLN
INTRAVENOUS | Status: DC
  Administered 2018-11-09: 09:00:00 via INTRAVENOUS

## 2018-11-09 MED ORDER — ISOSORBIDE MONONITRATE ER 30 MG PO TB24: 30.0000 mg | ORAL_TABLET | Freq: Every day | ORAL | Status: DC

## 2018-11-09 MED ORDER — METHOCARBAMOL 500 MG PO TABS
500.0000 mg | ORAL_TABLET | Freq: Four times a day (QID) | ORAL | Status: DC | PRN
  Administered 2018-11-09 – 2018-11-11 (×6): 500 mg via ORAL
  Filled 2018-11-09 (×6): qty 1

## 2018-11-09 MED ORDER — CHLORHEXIDINE GLUCONATE CLOTH 2 % EX PADS: 6.0000 | MEDICATED_PAD | Freq: Once | CUTANEOUS | Status: DC

## 2018-11-09 MED ORDER — PHENYLEPHRINE 40 MCG/ML (10ML) SYRINGE FOR IV PUSH (FOR BLOOD PRESSURE SUPPORT)
PREFILLED_SYRINGE | INTRAVENOUS | Status: AC
  Filled 2018-11-09: qty 10

## 2018-11-09 MED ORDER — ONDANSETRON HCL 4 MG PO TABS: 4.0000 mg | ORAL_TABLET | Freq: Four times a day (QID) | ORAL | Status: DC | PRN

## 2018-11-09 MED ORDER — METHOCARBAMOL 1000 MG/10ML IJ SOLN
500.0000 mg | Freq: Four times a day (QID) | INTRAVENOUS | Status: DC | PRN
  Filled 2018-11-09 (×2): qty 5

## 2018-11-09 MED ORDER — 0.9 % SODIUM CHLORIDE (POUR BTL) OPTIME
TOPICAL | Status: DC | PRN
  Administered 2018-11-09: 1000 mL

## 2018-11-09 MED ORDER — SODIUM CHLORIDE 0.9 % IV SOLN: 250.0000 mL | INTRAVENOUS | Status: DC

## 2018-11-09 MED ORDER — BUPIVACAINE HCL (PF) 0.25 % IJ SOLN
INTRAMUSCULAR | Status: AC
  Filled 2018-11-09: qty 30

## 2018-11-09 MED ORDER — DEXAMETHASONE SODIUM PHOSPHATE 10 MG/ML IJ SOLN
10.0000 mg | Freq: Once | INTRAMUSCULAR | Status: AC
  Administered 2018-11-09: 8 mg via INTRAVENOUS
  Filled 2018-11-09: qty 1

## 2018-11-09 MED ORDER — THROMBIN 5000 UNITS EX SOLR
CUTANEOUS | Status: DC | PRN
  Administered 2018-11-09 (×2): 5000 [IU] via TOPICAL

## 2018-11-09 MED ORDER — BUPIVACAINE HCL (PF) 0.25 % IJ SOLN
INTRAMUSCULAR | Status: DC | PRN
  Administered 2018-11-09: 2 mL

## 2018-11-09 MED ORDER — MORPHINE SULFATE (PF) 2 MG/ML IV SOLN
2.0000 mg | INTRAVENOUS | Status: DC | PRN
  Administered 2018-11-09 – 2018-11-10 (×2): 2 mg via INTRAVENOUS
  Filled 2018-11-09: qty 1

## 2018-11-09 MED ORDER — SODIUM CHLORIDE 0.9% FLUSH
3.0000 mL | Freq: Two times a day (BID) | INTRAVENOUS | Status: DC
  Administered 2018-11-09 – 2018-11-10 (×3): 3 mL via INTRAVENOUS

## 2018-11-09 MED ORDER — PYRIDOSTIGMINE BROMIDE 60 MG PO TABS
60.0000 mg | ORAL_TABLET | Freq: Every day | ORAL | Status: DC
  Filled 2018-11-09: qty 1

## 2018-11-09 MED ORDER — FUROSEMIDE 40 MG PO TABS
40.0000 mg | ORAL_TABLET | Freq: Every day | ORAL | Status: DC
  Administered 2018-11-09 – 2018-11-11 (×3): 40 mg via ORAL
  Filled 2018-11-09 (×3): qty 1

## 2018-11-09 MED ORDER — CEFAZOLIN SODIUM-DEXTROSE 2-4 GM/100ML-% IV SOLN
INTRAVENOUS | Status: AC
  Filled 2018-11-09: qty 100

## 2018-11-09 MED ORDER — DIPHENOXYLATE-ATROPINE 2.5-0.025 MG PO TABS: 1.0000 | ORAL_TABLET | Freq: Four times a day (QID) | ORAL | Status: DC | PRN

## 2018-11-09 MED ORDER — THROMBIN 5000 UNITS EX SOLR
CUTANEOUS | Status: AC
  Filled 2018-11-09: qty 5000

## 2018-11-09 MED ORDER — CEFAZOLIN SODIUM-DEXTROSE 2-4 GM/100ML-% IV SOLN
2.0000 g | INTRAVENOUS | Status: AC
  Administered 2018-11-09: 2 g via INTRAVENOUS

## 2018-11-09 MED ORDER — ACETAMINOPHEN 650 MG RE SUPP: 650.0000 mg | RECTAL | Status: DC | PRN

## 2018-11-09 MED ORDER — ISOSORBIDE MONONITRATE ER 30 MG PO TB24
30.0000 mg | ORAL_TABLET | Freq: Every day | ORAL | Status: DC
  Administered 2018-11-10 – 2018-11-11 (×2): 30 mg via ORAL
  Filled 2018-11-09 (×2): qty 1

## 2018-11-09 MED ORDER — LOPERAMIDE HCL 2 MG PO CAPS: 2.0000 mg | ORAL_CAPSULE | Freq: Four times a day (QID) | ORAL | Status: DC | PRN

## 2018-11-09 MED ORDER — MIDAZOLAM HCL 5 MG/5ML IJ SOLN
INTRAMUSCULAR | Status: DC | PRN
  Administered 2018-11-09: 2 mg via INTRAVENOUS

## 2018-11-09 MED ORDER — LORAZEPAM 2 MG/ML IJ SOLN
INTRAMUSCULAR | Status: AC
  Filled 2018-11-09: qty 1

## 2018-11-09 MED ORDER — BRIMONIDINE TARTRATE 0.2 % OP SOLN
1.0000 [drp] | Freq: Two times a day (BID) | OPHTHALMIC | Status: DC
  Administered 2018-11-09 – 2018-11-11 (×4): 1 [drp] via OPHTHALMIC
  Filled 2018-11-09: qty 5

## 2018-11-09 MED ORDER — EPHEDRINE 5 MG/ML INJ
INTRAVENOUS | Status: AC
  Filled 2018-11-09: qty 10

## 2018-11-09 MED ORDER — POTASSIUM CHLORIDE IN NACL 20-0.9 MEQ/L-% IV SOLN
INTRAVENOUS | Status: DC
  Administered 2018-11-09: 20:00:00 via INTRAVENOUS
  Filled 2018-11-09 (×2): qty 1000

## 2018-11-09 MED ORDER — SUGAMMADEX SODIUM 200 MG/2ML IV SOLN
INTRAVENOUS | Status: DC | PRN
  Administered 2018-11-09: 135 mg via INTRAVENOUS

## 2018-11-09 MED ORDER — SODIUM CHLORIDE 0.9 % IV SOLN
INTRAVENOUS | Status: DC | PRN
  Administered 2018-11-09: 500 mL

## 2018-11-09 MED ORDER — PREDNISONE 1 MG PO TABS
1.0000 mg | ORAL_TABLET | Freq: Every day | ORAL | Status: DC
  Administered 2018-11-10 – 2018-11-11 (×2): 1 mg via ORAL
  Filled 2018-11-09 (×2): qty 1

## 2018-11-09 MED ORDER — DEXAMETHASONE SODIUM PHOSPHATE 4 MG/ML IJ SOLN
4.0000 mg | Freq: Four times a day (QID) | INTRAMUSCULAR | Status: DC
  Administered 2018-11-10 – 2018-11-11 (×2): 4 mg via INTRAVENOUS
  Filled 2018-11-09 (×2): qty 1

## 2018-11-09 MED ORDER — SODIUM CHLORIDE 0.9 % IV SOLN
INTRAVENOUS | Status: DC | PRN
  Administered 2018-11-09: 25 ug/min via INTRAVENOUS

## 2018-11-09 MED ORDER — PHENOL 1.4 % MT LIQD: 1.0000 | OROMUCOSAL | Status: DC | PRN

## 2018-11-09 MED ORDER — ACETAMINOPHEN 325 MG PO TABS
650.0000 mg | ORAL_TABLET | ORAL | Status: DC | PRN
  Administered 2018-11-09 – 2018-11-11 (×5): 650 mg via ORAL
  Filled 2018-11-09 (×6): qty 2

## 2018-11-09 MED ORDER — MORPHINE SULFATE (PF) 2 MG/ML IV SOLN
INTRAVENOUS | Status: AC
  Filled 2018-11-09: qty 1

## 2018-11-09 MED ORDER — CEFAZOLIN SODIUM-DEXTROSE 2-4 GM/100ML-% IV SOLN
2.0000 g | Freq: Three times a day (TID) | INTRAVENOUS | Status: AC
  Administered 2018-11-09 – 2018-11-10 (×2): 2 g via INTRAVENOUS
  Filled 2018-11-09: qty 100

## 2018-11-09 MED ORDER — ONDANSETRON HCL 4 MG/2ML IJ SOLN
INTRAMUSCULAR | Status: AC
  Administered 2018-11-09: 10:00:00 4 mg
  Filled 2018-11-09: qty 2

## 2018-11-09 MED ORDER — LIDOCAINE 2% (20 MG/ML) 5 ML SYRINGE
INTRAMUSCULAR | Status: AC
  Filled 2018-11-09: qty 5

## 2018-11-09 MED ORDER — SODIUM CHLORIDE 0.9% FLUSH: 3.0000 mL | INTRAVENOUS | Status: DC | PRN

## 2018-11-09 MED ORDER — PYRIDOSTIGMINE BROMIDE 60 MG PO TABS
60.0000 mg | ORAL_TABLET | Freq: Every day | ORAL | Status: DC
  Administered 2018-11-10 – 2018-11-11 (×2): 60 mg via ORAL
  Filled 2018-11-09 (×3): qty 1

## 2018-11-09 MED ORDER — ONDANSETRON HCL 4 MG/2ML IJ SOLN
INTRAMUSCULAR | Status: AC
  Filled 2018-11-09: qty 2

## 2018-11-09 MED ORDER — TIMOLOL MALEATE 0.5 % OP SOLN
1.0000 [drp] | Freq: Two times a day (BID) | OPHTHALMIC | Status: DC
  Administered 2018-11-10 – 2018-11-11 (×4): 1 [drp] via OPHTHALMIC
  Filled 2018-11-09 (×2): qty 5

## 2018-11-09 MED ORDER — EPHEDRINE SULFATE 50 MG/ML IJ SOLN
INTRAMUSCULAR | Status: DC | PRN
  Administered 2018-11-09 (×3): 5 mg via INTRAVENOUS

## 2018-11-09 MED ORDER — LIDOCAINE 2% (20 MG/ML) 5 ML SYRINGE
INTRAMUSCULAR | Status: DC | PRN
  Administered 2018-11-09: 20 mg via INTRAVENOUS

## 2018-11-09 MED ORDER — FENTANYL CITRATE (PF) 250 MCG/5ML IJ SOLN
INTRAMUSCULAR | Status: AC
  Filled 2018-11-09: qty 5

## 2018-11-09 MED ORDER — SENNA 8.6 MG PO TABS
1.0000 | ORAL_TABLET | Freq: Two times a day (BID) | ORAL | Status: DC
  Administered 2018-11-09 – 2018-11-11 (×4): 8.6 mg via ORAL
  Filled 2018-11-09 (×4): qty 1

## 2018-11-09 MED ORDER — MIDAZOLAM HCL 2 MG/2ML IJ SOLN
INTRAMUSCULAR | Status: AC
  Filled 2018-11-09: qty 2

## 2018-11-09 MED ORDER — MENTHOL 3 MG MT LOZG: 1.0000 | LOZENGE | OROMUCOSAL | Status: DC | PRN

## 2018-11-09 MED ORDER — LORAZEPAM 2 MG/ML IJ SOLN
0.5000 mg | Freq: Once | INTRAMUSCULAR | Status: AC
  Administered 2018-11-09: 0.5 mg via INTRAVENOUS

## 2018-11-09 MED ORDER — THROMBIN 5000 UNITS EX SOLR
OROMUCOSAL | Status: DC | PRN
  Administered 2018-11-09 (×2): 5 mL via TOPICAL

## 2018-11-09 MED ORDER — FENTANYL CITRATE (PF) 100 MCG/2ML IJ SOLN
INTRAMUSCULAR | Status: AC
  Filled 2018-11-09: qty 2

## 2018-11-09 MED ORDER — DEXAMETHASONE 4 MG PO TABS
4.0000 mg | ORAL_TABLET | Freq: Four times a day (QID) | ORAL | Status: DC
  Administered 2018-11-09 – 2018-11-11 (×5): 4 mg via ORAL
  Filled 2018-11-09 (×5): qty 1

## 2018-11-09 MED ORDER — LACTATED RINGERS IV SOLN
INTRAVENOUS | Status: DC | PRN
  Administered 2018-11-09: 11:00:00 via INTRAVENOUS

## 2018-11-09 MED ORDER — ROCURONIUM BROMIDE 50 MG/5ML IV SOSY
PREFILLED_SYRINGE | INTRAVENOUS | Status: DC | PRN
  Administered 2018-11-09: 20 mg via INTRAVENOUS
  Administered 2018-11-09: 70 mg via INTRAVENOUS

## 2018-11-09 MED ORDER — THROMBIN 5000 UNITS EX SOLR
CUTANEOUS | Status: AC
  Filled 2018-11-09: qty 15000

## 2018-11-09 MED ORDER — ROCURONIUM BROMIDE 10 MG/ML (PF) SYRINGE
PREFILLED_SYRINGE | INTRAVENOUS | Status: AC
  Filled 2018-11-09: qty 10

## 2018-11-09 MED ORDER — PROPOFOL 10 MG/ML IV BOLUS
INTRAVENOUS | Status: AC
  Filled 2018-11-09: qty 20

## 2018-11-09 MED ORDER — OCUVITE-LUTEIN PO CAPS
1.0000 | ORAL_CAPSULE | Freq: Every day | ORAL | Status: DC
  Administered 2018-11-09: 1 via ORAL
  Filled 2018-11-09 (×2): qty 1

## 2018-11-09 MED ORDER — FENTANYL CITRATE (PF) 100 MCG/2ML IJ SOLN
INTRAMUSCULAR | Status: DC | PRN
  Administered 2018-11-09 (×3): 50 ug via INTRAVENOUS

## 2018-11-09 MED ORDER — FENTANYL CITRATE (PF) 100 MCG/2ML IJ SOLN
25.0000 ug | INTRAMUSCULAR | Status: DC | PRN
  Administered 2018-11-09 (×5): 25 ug via INTRAVENOUS

## 2018-11-09 MED ORDER — OXYCODONE HCL 5 MG PO TABS
5.0000 mg | ORAL_TABLET | ORAL | Status: DC | PRN
  Administered 2018-11-09 – 2018-11-11 (×8): 5 mg via ORAL
  Filled 2018-11-09 (×8): qty 1

## 2018-11-09 MED ORDER — HEMOSTATIC AGENTS (NO CHARGE) OPTIME
TOPICAL | Status: DC | PRN
  Administered 2018-11-09: 1 via TOPICAL

## 2018-11-09 MED ORDER — METHOCARBAMOL 500 MG PO TABS
ORAL_TABLET | ORAL | Status: AC
  Filled 2018-11-09: qty 1

## 2018-11-09 MED ORDER — PROPOFOL 10 MG/ML IV BOLUS
INTRAVENOUS | Status: DC | PRN
  Administered 2018-11-09: 70 mg via INTRAVENOUS
  Administered 2018-11-09: 50 mg via INTRAVENOUS

## 2018-11-09 MED ORDER — ONDANSETRON HCL 4 MG/2ML IJ SOLN: 4.0000 mg | Freq: Four times a day (QID) | INTRAMUSCULAR | Status: DC | PRN

## 2018-11-09 MED ORDER — ACETAMINOPHEN 500 MG PO TABS
1000.0000 mg | ORAL_TABLET | Freq: Once | ORAL | Status: AC
  Administered 2018-11-09: 1000 mg via ORAL
  Filled 2018-11-09: qty 2

## 2018-11-09 SURGICAL SUPPLY — 60 items
APL SKNCLS STERI-STRIP NONHPOA (GAUZE/BANDAGES/DRESSINGS) ×2
BAG DECANTER FOR FLEXI CONT (MISCELLANEOUS) ×4 IMPLANT
BAND INSRT 18 STRL LF DISP RB (MISCELLANEOUS) ×2
BAND RUBBER #18 3X1/16 STRL (MISCELLANEOUS) ×8 IMPLANT
BASKET BONE COLLECTION (BASKET) ×4 IMPLANT
BENZOIN TINCTURE PRP APPL 2/3 (GAUZE/BANDAGES/DRESSINGS) ×4 IMPLANT
BIT DRILL 14MM (INSTRUMENTS) ×2 IMPLANT
BUR MATCHSTICK NEURO 3.0 LAGG (BURR) ×4 IMPLANT
CAGE CERVICAL 8 (Cage) ×2 IMPLANT
CAGE CERVICAL 8MM (Cage) ×1 IMPLANT
CANISTER SUCT 3000ML PPV (MISCELLANEOUS) ×4 IMPLANT
CARTRIDGE OIL MAESTRO DRILL (MISCELLANEOUS) ×2 IMPLANT
CLOSURE WOUND 1/2 X4 (GAUZE/BANDAGES/DRESSINGS) ×1
DIFFUSER DRILL AIR PNEUMATIC (MISCELLANEOUS) ×4 IMPLANT
DRAIN SNY 7 FPER (WOUND CARE) ×4 IMPLANT
DRAPE C-ARM 42X72 X-RAY (DRAPES) ×8 IMPLANT
DRAPE LAPAROTOMY 100X72 PEDS (DRAPES) ×4 IMPLANT
DRAPE MICROSCOPE LEICA (MISCELLANEOUS) ×4 IMPLANT
DRILL 14MM (INSTRUMENTS) ×4
DRSG OPSITE POSTOP 4X6 (GAUZE/BANDAGES/DRESSINGS) ×4 IMPLANT
DURAPREP 6ML APPLICATOR 50/CS (WOUND CARE) ×4 IMPLANT
ELECT COATED BLADE 2.86 ST (ELECTRODE) ×4 IMPLANT
ELECT REM PT RETURN 9FT ADLT (ELECTROSURGICAL) ×4
ELECTRODE REM PT RTRN 9FT ADLT (ELECTROSURGICAL) ×2 IMPLANT
EVACUATOR SILICONE 100CC (DRAIN) ×4 IMPLANT
GLOVE BIO SURGEON STRL SZ7 (GLOVE) ×4 IMPLANT
GLOVE BIO SURGEON STRL SZ8 (GLOVE) ×4 IMPLANT
GLOVE BIOGEL PI IND STRL 7.0 (GLOVE) ×6 IMPLANT
GLOVE BIOGEL PI INDICATOR 7.0 (GLOVE) ×6
GLOVE SS N UNI LF 6.5 STRL (GLOVE) ×8 IMPLANT
GLOVE SS N UNI LF 7.0 STRL (GLOVE) ×8 IMPLANT
GOWN STRL REUS W/ TWL LRG LVL3 (GOWN DISPOSABLE) ×2 IMPLANT
GOWN STRL REUS W/ TWL XL LVL3 (GOWN DISPOSABLE) ×6 IMPLANT
GOWN STRL REUS W/TWL 2XL LVL3 (GOWN DISPOSABLE) IMPLANT
GOWN STRL REUS W/TWL LRG LVL3 (GOWN DISPOSABLE) ×3
GOWN STRL REUS W/TWL XL LVL3 (GOWN DISPOSABLE) ×12
HEMOSTAT POWDER KIT SURGIFOAM (HEMOSTASIS) ×8 IMPLANT
KIT BASIN OR (CUSTOM PROCEDURE TRAY) ×4 IMPLANT
KIT TURNOVER KIT B (KITS) ×4 IMPLANT
NEEDLE HYPO 25X1 1.5 SAFETY (NEEDLE) ×4 IMPLANT
NEEDLE SPNL 20GX3.5 QUINCKE YW (NEEDLE) ×4 IMPLANT
NS IRRIG 1000ML POUR BTL (IV SOLUTION) ×4 IMPLANT
OIL CARTRIDGE MAESTRO DRILL (MISCELLANEOUS) ×4
PACK LAMINECTOMY NEURO (CUSTOM PROCEDURE TRAY) ×4 IMPLANT
PIN DISTRACTION 14MM (PIN) ×8 IMPLANT
PLATE 45MM (Plate) ×4 IMPLANT
PLATE 45X3 LVL NS SPNE CVD (Plate) ×2 IMPLANT
SCREW VA ST 16 (Screw) ×24 IMPLANT
SPACER LORD PEEK 17X15X22 5D (Spacer) ×4 IMPLANT
SPACER SPNL 7D LRG 16X14X8XNS (Cage) ×2 IMPLANT
SPCR SPNL 7D LRG 16X14X8XNS (Cage) ×2 IMPLANT
SPONGE INTESTINAL PEANUT (DISPOSABLE) ×4 IMPLANT
SPONGE SURGIFOAM ABS GEL SZ50 (HEMOSTASIS) ×4 IMPLANT
STRIP CLOSURE SKIN 1/2X4 (GAUZE/BANDAGES/DRESSINGS) ×3 IMPLANT
SUT VIC AB 3-0 SH 8-18 (SUTURE) ×4 IMPLANT
SUT VIC AB 4-0 PS2 18 (SUTURE) IMPLANT
SUT VICRYL 4-0 PS2 18IN ABS (SUTURE) ×4 IMPLANT
TOWEL GREEN STERILE (TOWEL DISPOSABLE) ×4 IMPLANT
TOWEL GREEN STERILE FF (TOWEL DISPOSABLE) ×4 IMPLANT
WATER STERILE IRR 1000ML POUR (IV SOLUTION) ×4 IMPLANT

## 2018-11-09 NOTE — Transfer of Care (Signed)
Immediate Anesthesia Transfer of Care Note  Patient: [name]  Procedure(s) Performed: CERVICAL THREE-FOUR ANTERIOR CERVICAL DECOMPRESSION/DISCECTOMY FUSION WITH CERVICAL FIVE CORPECTOMY (N/A Spine Cervical)  Patient Location: PACU  Anesthesia Type:General  Level of Consciousness: awake and alert   Airway & Oxygen Therapy: Patient Spontanous Breathing and Patient connected to face mask oxygen  Post-op Assessment: Report given to RN and Post -op Vital signs reviewed and stable  Post vital signs: Reviewed and stable  Last Vitals:  Vitals Value Taken Time  BP 163/50 11/09/18 1410  Temp 36.8 C 11/09/18 1410  Pulse 68 11/09/18 1416  Resp 12 11/09/18 1416  SpO2 99 % 11/09/18 1416  Vitals shown include unvalidated device data.  Last Pain:  Vitals:   11/09/18 1410  TempSrc:   PainSc: 0-No pain      Patients Stated Pain Goal: 2 ([id number])  Complications: No apparent anesthesia complications

## 2018-11-09 NOTE — Anesthesia Postprocedure Evaluation (Signed)
Anesthesia Post Note  Patient: [name]  Procedure(s) Performed: CERVICAL THREE-FOUR ANTERIOR CERVICAL DECOMPRESSION/DISCECTOMY FUSION WITH CERVICAL FIVE CORPECTOMY (N/A Spine Cervical)     Patient location during evaluation: PACU Anesthesia Type: General Level of consciousness: awake and alert Pain management: pain level controlled Vital Signs Assessment: post-procedure vital signs reviewed and stable Respiratory status: spontaneous breathing, nonlabored ventilation, respiratory function stable and patient connected to nasal cannula oxygen Cardiovascular status: blood pressure returned to baseline and stable Postop Assessment: no apparent nausea or vomiting Anesthetic complications: no    Last Vitals:  Vitals:   11/09/18 1625 11/09/18 1725  BP: 139/62 140/64  Pulse: 68 68  Resp: 12 11  Temp:    SpO2: 97% 97%    Last Pain:  Vitals:   11/09/18 1725  TempSrc:   PainSc: 0-No pain                 Brexlee Heberlein L Azyria Osmon

## 2018-11-09 NOTE — H&P (Signed)
Subjective:   Patient is a 78 y.o. male admitted for myelopathy. The patient first presented to me with complaints of neck pain, numbness of the arm(s) and loss of strength of the arm(s). Onset of symptoms was several months ago. The pain is described as aching and occurs all day. The pain is rated moderate, and is located in the neck and radiates to the arms. The symptoms have been progressive. Symptoms are exacerbated by extending head backwards, and are relieved by none.  Previous work up includes MRI of cervical spine, results: spinal stenosis.  Past Medical History:  Diagnosis Date  . Bruises easily    d/t being on Eliquis  . Carotid artery disease ([place])    a. Duplex 05/2014: 123456 RICA, 123456 LICA, elevated velocities in right subclavian artery, normal left subclavian artery..  . Chronic back pain    HNP  . Chronic kidney disease (CKD)   . CKD (chronic kidney disease)   . Claustrophobia    takes Ativan as needed  . CML (chronic myeloid leukemia) ([place])    takes Gleevec daily  . Coronary atherosclerosis of unspecified type of vessel, native or graft    a. cath in 2003 showed 10-20% LM, scattered 20% prox-mid LAD, 30% more distal LAD, 50-60% stenosis of LAD towards apex, 20% Cx, 30% dRCA, focal 95% stenosis in smaller of 2 branches of PDA, EF 60-65%.  . Diabetes mellitus ([place])    no longer- cancxelled by Dr Hale Bogus  . Diarrhea    takes Imodium daily as needed  . Diverticulosis   . Diverticulosis of colon (without mention of hemorrhage)   . DJD (degenerative joint disease)   . DVT (deep venous thrombosis) ([place]) 07/2011  . Esophageal stricture   . Essential hypertension    takes Imdur and Lisinopril daily  . Gastric polyps    benign  . GERD (gastroesophageal reflux disease)    takes Nexium daily  . Glaucoma    uses eye drops  . History of blood transfusion `   leg amputation  . History of bronchitis    not sure when the last time  . History of colon polyps    benign  .  History of kidney stones    litrotrispspy  . History of kidney stones    passed  . Insomnia    takes Melatonin nightly as needed  . Mixed hyperlipidemia    takes Crestor daily  . Myasthenia gravis ([place])    uses prednisone  . Osteoporosis 2016  . Peripheral edema    takes Lasix daily  . Peripheral vascular disease ([place])   . Pituitary tumor    checks Dr[facility]Walden at [facility] checks it every Jan   . Pneumonia 01/2016  . PONV (postoperative nausea and vomiting)   . Ptosis   . Pulmonary embolus ([place]) 2012  . Stroke ([place]) 06/2015   x 3- last one 2018- balance issuses    Past Surgical History:  Procedure Laterality Date  . ABDOMINAL AORTOGRAM W/LOWER EXTREMITY N/A 11/18/2016   Procedure: ABDOMINAL AORTOGRAM W/LOWER EXTREMITY;  Surgeon: [name], MD;  Location: [place] [facility];  Service: Cardiovascular;  Laterality: N/A;  . AMPUTATION Right 02/07/2018   Procedure: right leg AMPUTATION ABOVE KNEE;  Surgeon: [name], MD;  Location: [place];  Service: Vascular;  Laterality: Right;  . AORTOGRAM Right 12/23/2017   Procedure: AORTOGRAM, RIGHT LOWER EXTREMITY ANGIOGRAM, RIGHT SUPERFICIAL FEMORAL ARTERY ANGIOPLASTY WITH STENT, RIGHT POPLITEAL ARTERY ANGIOPLASTY WITH STENT, LEFT GROIN CUTDOWN AND  LEFT FEMORAL ARTERY REPAIR;  Surgeon: [name], MD;  Location: [place];  Service: Vascular;  Laterality: Right;  . BACK SURGERY     x2, lumbar and cervical  . CARDIAC CATHETERIZATION    . CARPAL TUNNEL RELEASE Left 09/23/2018   Procedure: CARPAL TUNNEL RELEASE;  Surgeon: [name], MD;  Location: [place];  Service: Neurosurgery;  Laterality: Left;  CARPAL TUNNEL RELEASE  . CATARACT EXTRACTION Bilateral 12/12  . COLONOSCOPY    . ESOPHAGOGASTRODUODENOSCOPY ENDOSCOPY  04/2015  . IR GENERIC HISTORICAL  03/03/2016   IR IVC FILTER PLMT / S&I [name] GUID/MOD SED 03/03/2016 [name], MD [facility]  . IR GENERIC HISTORICAL  04/09/2016   IR RADIOLOGIST EVAL & MGMT 04/09/2016  [name], MD [facility]  . IR GENERIC HISTORICAL  04/30/2016   IR IVC FILTER RETRIEVAL / S&I [name] GUID/MOD SED 04/30/2016 [name], MD [facility]  . LITHOTRIPSY    . LUMBAR LAMINECTOMY/DECOMPRESSION MICRODISCECTOMY Right 09/14/2012   Procedure: Right Lumbar three-four, four-five, Lumbar five-Sacral one decompressive laminectomy;  Surgeon: [name], MD;  Location: [place] [facility];  Service: Neurosurgery;  Laterality: Right;  . LUMBAR LAMINECTOMY/DECOMPRESSION MICRODISCECTOMY Left 03/11/2016   Procedure: Left Lumbar Two-ThreeMicrodiscectomy;  Surgeon: [name], MD;  Location: [place];  Service: Neurosurgery;  Laterality: Left;  . PERIPHERAL VASCULAR INTERVENTION  11/18/2016   Procedure: PERIPHERAL VASCULAR INTERVENTION;  Surgeon: [name], MD;  Location: [place] [facility];  Service: Cardiovascular;;  Left popliteal  . SHOULDER SURGERY Left 05/07/2009  . TONSILLECTOMY    . ULNAR NERVE TRANSPOSITION Left 09/23/2018   Procedure: ULNAR NERVE DECOMPRESSION/TRANSPOSITION;  Surgeon: [name], MD;  Location: [place];  Service: Neurosurgery;  Laterality: Left;  ULNAR NERVE DECOMPRESSION/TRANSPOSITION    Allergies  Allergen Reactions  . Other Other (See Comments)    Regarding scans, the PATIENT IS CLAUSTROPHOBIC!!!  . Phenergan [Promethazine Hcl] Swelling and Other (See Comments)    Cannot be combined with Zofran because swelling of the eyes resulted and patient became very restless-  . Zofran [Ondansetron Hcl] Swelling and Other (See Comments)    Cannot be combined with Phenergan because swelling of the eyes resulted and patient became very restless-  . Sulfamethoxazole Rash and Itching    Social History   Tobacco Use  . Smoking status: Never Smoker  . Smokeless tobacco: Never Used  Substance Use Topics  . Alcohol use: No    Alcohol/week: 0.0 standard drinks    Family History  Problem Relation Age of Onset  . Heart disease Mother   . Heart attack Mother   .  Stroke Mother   . Heart disease Father   . Heart attack Father   . Stroke Father   . Breast cancer Sister        Twin   . Heart attack Sister   . Dementia Sister   . Heart disease Sister   . Heart attack Sister   . Clotting disorder Sister   . Heart attack Sister   . Colon cancer Neg Hx    Prior to Admission medications   Medication Sig Start Date End Date Taking? Authorizing Provider  brimonidine (ALPHAGAN) 0.2 % ophthalmic solution Place 1 drop into both eyes 2 (two) times daily.   Yes [provider]  cholecalciferol (VITAMIN D) 1000 units tablet Take 1,000 Units by mouth daily.    Yes [provider]  diclofenac sodium (VOLTAREN) 1 % GEL Apply 2 g topically 3 (  three) times daily as needed. Patient taking differently: Apply 2 g topically 3 (three) times daily as needed (pain).  08/15/18  Yes Quintella Reichert, MD  diphenoxylate-atropine (LOMOTIL) 2.5-0.025 MG tablet Take 1 tablet by mouth every 6 (six) hours as needed for diarrhea or loose stools. 05/19/18  Yes Armbruster, Carlota Raspberry, MD  ELIQUIS 5 MG TABS tablet TAKE 1 TABLET (5 MG TOTAL) BY MOUTH 2 (TWO) TIMES DAILY. Patient taking differently: Take 2.5 mg by mouth 2 (two) times daily.  12/09/15  Yes Kathrynn Ducking, MD  furosemide (LASIX) 40 MG tablet Take 0.5 tablets (20 mg total) by mouth daily. Patient taking differently: Take 40 mg by mouth daily.  02/18/18 02/13/19 Yes Love, Ivan Anchors, PA-C  gabapentin (NEURONTIN) 100 MG capsule Take one at night for one week and then take one twice daily. Patient taking differently: Take 100 mg by mouth 2 (two) times daily as needed (pain).  06/27/18  Yes Libby Maw, MD  HYDROcodone-acetaminophen (NORCO/VICODIN) 5-325 MG tablet Take 1 tablet by mouth every 8 (eight) hours as needed for moderate pain. for pain 09/23/18  Yes [provider]  imatinib (GLEEVEC) 100 MG tablet Take 300 mg by mouth daily with lunch. Take with noon meal and a large glass of  water.Caution:Chemotherapy   Yes [provider]  isosorbide mononitrate (IMDUR) 30 MG 24 hr tablet Take 1 tablet (30 mg total) by mouth daily. 09/17/15  Yes Sherren Mocha, MD  lidocaine (LIDODERM) 5 % Place 1 patch onto the skin daily as needed (for back pain). Remove & Discard patch within 12 hours or as directed by MD    Yes [provider]  loperamide (IMODIUM) 2 MG capsule Take 2 mg by mouth every 6 (six) hours as needed for diarrhea or loose stools.    Yes [provider]  Multiple Vitamin (MULTIVITAMIN) tablet Take 1 tablet by mouth daily.   Yes [provider]  multivitamin-lutein (OCUVITE-LUTEIN) CAPS capsule Take 1 capsule by mouth daily.   Yes [provider]  oxycodone (OXY-IR) 5 MG capsule Take 1 capsule (5 mg total) by mouth every 6 (six) hours as needed. Patient taking differently: Take 5 mg by mouth every 6 (six) hours as needed (severe).  10/11/18  Yes [name], MD  oxyCODONE-acetaminophen (PERCOCET/ROXICET) 5-325 MG tablet Take 1 tablet by mouth every 4 (four) hours as needed for severe pain. 10/27/18  Yes [provider]  predniSONE (DELTASONE) 1 MG tablet Two tablets daily for 2 months then take one tablet daily for 2 months, then stop Patient taking differently: Take 1 mg by mouth daily with breakfast.  09/13/18  Yes Kathrynn Ducking, MD  pyridostigmine (MESTINON) 60 MG tablet Take 60 mg by mouth daily.   Yes [provider]  rosuvastatin (CRESTOR) 10 MG tablet Take 5 mg by mouth daily.   Yes [provider]  timolol (BETIMOL) 0.5 % ophthalmic solution Place 1 drop into both eyes 2 (two) times daily.   Yes [provider]     Review of Systems  Positive ROS: neg  All other systems have been reviewed and were otherwise negative with the exception of those mentioned in the HPI and as above.  Objective: Vital signs in last 24 hours: Temp:  [98.2 F (36.8 C)] 98.2 F (36.8 C) (09/30  0802) Pulse Rate:  [73] 73 (09/30 0802) Resp:  [18] 18 (09/30 0802) BP: (133)/(34) 133/34 (09/30 0802) SpO2:  [99 %] 99 % (09/30 0802) Weight:  [  67.1 kg] 67.1 kg (09/30 0802)  General Appearance: Alert, cooperative, no distress, appears stated age Head: Normocephalic, without obvious abnormality, atraumatic Eyes: PERRL, conjunctiva/corneas clear, EOM's intact      Neck: Supple, symmetrical, trachea midline, Back: Symmetric, no curvature, ROM normal, no CVA tenderness Lungs:  respirations unlabored Heart: Regular rate and rhythm Abdomen: Soft, non-tender Extremities: L AKA Pulses: 2+ and symmetric all extremities Skin: Skin color, texture, turgor normal, no rashes or lesions  NEUROLOGIC:  Mental status: Alert and oriented x4, no aphasia, good attention span, fund of knowledge and memory  Motor Exam - very weak hands, L delt 3/5, L bicep 4-/5 Sensory Exam - hands numb Reflexes: 3+ Coordination - grossly normal Gait - not tested Balance - not tested Cranial Nerves: I: smell Not tested  II: visual acuity  OS: nl    OD: nl  II: visual fields Full to confrontation  II: pupils Equal, round, reactive to light  III,VII: ptosis None  III,IV,VI: extraocular muscles  Full ROM  V: mastication Normal  V: facial light touch sensation  Normal  V,VII: corneal reflex  Present  VII: facial muscle function - upper  Normal  VII: facial muscle function - lower Normal  VIII: hearing Not tested  IX: soft palate elevation  Normal  IX,X: gag reflex Present  XI: trapezius strength  5/5  XI: sternocleidomastoid strength 5/5  XI: neck flexion strength  5/5  XII: tongue strength  Normal    Data Review Lab Results  Component Value Date   WBC 6.7 11/07/2018   HGB 9.9 (L) 11/07/2018   HCT 29.1 (L) 11/07/2018   MCV 91.2 11/07/2018   PLT 324 11/07/2018   Lab Results  Component Value Date   NA 136 11/07/2018   K 3.6 11/07/2018   CL 94 (L) 11/07/2018   CO2 30 11/07/2018   BUN 12 11/07/2018    CREATININE 1.86 (H) 11/07/2018   GLUCOSE 108 (H) 11/07/2018   Lab Results  Component Value Date   INR 1.1 11/07/2018    Assessment:   Cervical neck pain with 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 With severe myelopathy.  Estimated body mass index is 23.89 kg/m as calculated from the following:   Height as of this encounter: 5\' 6"  (1.676 m).   Weight as of this encounter: 67.1 kg.  Patient has failed conservative therapy. Planned surgery : C3-4 ACF C5 corpectomy  Plan:   I explained the condition and procedure to the patient and answered any questions.  Patient wishes to proceed with procedure as planned. Understands risks/ benefits/ and expected or typical outcomes.  [name] 11/09/2018 9:20 AM

## 2018-11-09 NOTE — Op Note (Signed)
11/09/2018  1:57 PM  PATIENT:  [name]  78 y.o. 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 Decompressive anterior cervical discectomy C3-4 with anterior cervical corpectomy C5, 2. Anterior cervical arthrodesis C3-4 C4-5 and C5-6 utilizing peek interbody cages packed with locally harvested morcellized autologous bone graft at C3-4 and C4-C6, 3. Anterior cervical plating C3-C6 utilizing a Alphatec plate  SURGEON:  Sherley Bounds, MD  ASSISTANTS: Glenford Peers, FNP  ANESTHESIA:   General  EBL: 200 ml  Total I/O In: 1500 [I.V.:1500] Out: 200 [Blood:200]  BLOOD ADMINISTERED: none  DRAINS: 7 flat JP drain  SPECIMEN:  none  INDICATION FOR PROCEDURE: This patient presented with progressive weakness and numbness in his hands and upper extremities. Imaging showed severe spondylosis with severe spinal stenosis C3-4 C4-5 and C5-6 a large disc herniation behind the body of C5. The patient tried conservative measures without relief. Pain was debilitating. Recommended ACDF with plating at C3-4 with a C5 corpectomy. Patient understood the risks, benefits, and alternatives and potential outcomes and wished to proceed.  PROCEDURE DETAILS: Patient was brought to the operating room placed under general endotracheal anesthesia. Patient was placed in the supine position on the operating room table. The neck was prepped with Duraprep and draped in a sterile fashion.   Three cc of local anesthesia was injected and a transverse incision was made on the right side of the neck.  Dissection was carried down thru the subcutaneous tissue and the platysma was  elevated, opened, and undermined with Metzenbaum scissors.  Dissection was then carried out thru an avascular  plane leaving the sternocleidomastoid carotid artery and jugular vein laterally and the trachea and esophagus medially. The ventral aspect of the vertebral column was identified and a localizing x-ray was taken. The C4-5 level was identified. The longus colli muscles were then elevated and the retractor was placed to expose C3-4 C4-5 and C5-6. The annulus was incised and the disc space entered at all 3 levels. Discectomy was performed with micro-curettes and pituitary rongeurs. I then used the high-speed drill to drill the endplates down to the level of the posterior longitudinal ligament. The drill shavings were saved in a mucous trap for later arthrodesis. The operating microscope was draped and brought into the field provided additional magnification, illumination and visualization. Discectomy was continued posteriorly thru the disc space.  At C5 the C5 vertebral body was removed with a Leksell rongeur and with the high-speed drill down to the level of the ligament.  Posterior longitudinal ligament was opened with a nerve hook, and then removed along with disc herniation and osteophytes, decompressing the spinal canal and thecal sac at C3-4 and from C4-C6. We then continued to remove osteophytic overgrowth and disc material decompressing the neural foramina and exiting nerve roots bilaterally. The scope was angled up and down to help decompress and undercut the vertebral bodies.  There was a large disc protrusion behind the C5 vertebral body that was removed.  We were careful to dissect between the ligament and the disc and remove this carefully.  We could see the cord pulsatile through the dura.  The ligament was very stuck to the dura superiorly to the left just below the C4-5 disc space.  We were able to get about 90% of this removed.  Once  the decompression was completed we could pass a nerve hook circumferentially to assure adequate decompression in the midline and in the neural foramina. So by both  visualization and palpation we felt we had an adequate decompression of the neural elements. We then measured the height of the intravertebral disc space and selected a 8 millimeter peek interbody cage packed with autograft for C3-4 and a 22 mm orchectomy cage packed with morselized autograft for C4-C6.  The corpectomy bed was dried thoroughly with Surgifoam and with bone wax as needed along the lateral edges.  The cage was then gently positioned in the intravertebral disc space(s) and countersunk. I then used a 45 mm Alphatec plate and placed variable angle screws into the vertebral bodies of C3, C4 and C6 and locked them into position. The wound was irrigated with bacitracin solution, checked for hemostasis which was established and confirmed.  A 7 flat JP drain was placed.  Once meticulous hemostasis was achieved, we then proceeded with closure. The platysma was closed with interrupted 3-0 undyed Vicryl suture, the subcuticular layer was closed with interrupted 3-0 undyed Vicryl suture. The skin edges were approximated with steristrips. The drapes were removed. A sterile dressing was applied. The patient was then awakened from general anesthesia and transferred to the recovery room in stable condition. At the end of the procedure all sponge, needle and instrument counts were correct.   PLAN OF CARE: Admit to inpatient   PATIENT DISPOSITION:  PACU - hemodynamically stable.   Delay start of Pharmacological VTE agent (>24hrs) due to surgical blood loss or risk of bleeding:  yes

## 2018-11-09 NOTE — Anesthesia Procedure Notes (Signed)
Arterial Line Insertion Start/End9/30/2020 9:45 AM, 11/09/2018 9:51 AM Performed by: Verdie Drown, CRNA, CRNA  Preanesthetic checklist: patient identified, IV checked, site marked, risks and benefits discussed, surgical consent, monitors and equipment checked, pre-op evaluation, timeout performed and anesthesia consent Lidocaine 1% used for infiltration Right, radial was placed Catheter size: 20 G Hand hygiene performed  and maximum sterile barriers used  Allen's test indicative of satisfactory collateral circulation Attempts: 1 Procedure performed without using ultrasound guided technique. Ultrasound Notes:anatomy identified Following insertion, dressing applied and Biopatch. Post procedure assessment: normal  Patient tolerated the procedure well with no immediate complications.

## 2018-11-09 NOTE — Anesthesia Procedure Notes (Signed)
Procedure Name: Intubation Date/Time: 11/09/2018 10:29 AM Performed by: Inda Coke, CRNA Pre-anesthesia Checklist: Patient identified, Emergency Drugs available, Suction available and Patient being monitored Patient Re-evaluated:Patient Re-evaluated prior to induction Oxygen Delivery Method: Circle System Utilized Preoxygenation: Pre-oxygenation with 100% oxygen Induction Type: IV induction Ventilation: Mask ventilation without difficulty and Oral airway inserted - appropriate to patient size Laryngoscope Size: Mac and 4 Grade View: Grade I Tube type: Oral Tube size: 7.5 mm Number of attempts: 1 Airway Equipment and Method: Stylet and Oral airway Placement Confirmation: ETT inserted through vocal cords under direct vision,  positive ETCO2 and breath sounds checked- equal and bilateral Secured at: 22 cm Tube secured with: Tape Dental Injury: Teeth and Oropharynx as per pre-operative assessment

## 2018-11-10 ENCOUNTER — Encounter (HOSPITAL_COMMUNITY): Payer: Self-pay | Admitting: Neurological Surgery

## 2018-11-10 MED ORDER — PROSIGHT PO TABS
1.0000 | ORAL_TABLET | Freq: Every day | ORAL | Status: DC
  Administered 2018-11-10 – 2018-11-11 (×2): 1 via ORAL
  Filled 2018-11-10 (×2): qty 1

## 2018-11-10 MED FILL — Thrombin For Soln 5000 Unit: CUTANEOUS | Qty: 5000 | Status: AC

## 2018-11-10 MED FILL — Gelatin Absorbable MT Powder: OROMUCOSAL | Qty: 1 | Status: AC

## 2018-11-10 NOTE — Progress Notes (Signed)
Rehab Admissions Coordinator Note:  Patient was screened by Cleatrice Burke for appropriateness for an Inpatient Acute Rehab Consult per PT recs. Pt previously at [facility] 02/2018 with Dr. Naaman Plummer.  At this time, we are recommending Inpatient Rehab consult if pt would like to be considered for admit. Please advise.  Cleatrice Burke RN MSN 11/10/2018, 11:49 AM  I can be reached at [phone].

## 2018-11-10 NOTE — Evaluation (Signed)
Physical Therapy Evaluation Patient Details Name: [name] MRN: [id number] DOB: 13-Oct-1940 Today's Date: 11/10/2018   History of Present Illness  Pt is a 78 y.o. M with significant PMH of CAD, Diabetes Mellitus Type 2, DVT, PE, stroke, right AKA 2019, back surgery who presents s/p C3-4 ACDF and C5 corpectomy.  Clinical Impression  Pt admitted with above. Prior to admission, pt lives with his wife, requires assist for ADL's, and is independent with lateral scoot transfers into wheelchair. On PT evaluation, pt presents with decreased upper extremity strength, coordination, sensation, and pain. Requiring mod assist for bed mobility and min assist to transfer into a drop arm recliner using a slide board. Pt reports increased difficulty with transfers recently. Would benefit from CIR to address deficits and transfer training in order to maximize functional independence and decrease caregiver burden.     Follow Up Recommendations CIR;Supervision/Assistance - 24 hour    Equipment Recommendations  None recommended by PT    Recommendations for Other Services Rehab consult     Precautions / Restrictions Precautions Precautions: Fall;Cervical;Other (comment) Precaution Booklet Issued: Yes (comment) Precaution Comments: R AKA Required Braces or Orthoses: Cervical Brace Cervical Brace: Hard collar;Other (comment)(apply while sitting, on when OOB) Restrictions Weight Bearing Restrictions: No      Mobility  Bed Mobility Overal bed mobility: Needs Assistance Bed Mobility: Rolling;Sidelying to Sit Rolling: Min guard Sidelying to sit: Mod assist;+2 for safety/equipment       General bed mobility comments: Rolling towards right with cues to bend left knee and reach left arm over, mod assist for trunk elevation up to sitting  Transfers Overall transfer level: Needs assistance Equipment used: Sliding board Transfers: Lateral/Scoot Transfers          Lateral/Scoot Transfers: Min  assist General transfer comment: Attempted lateral scoot transfer initially without slide board but pt with decreased efficiency with scooting. Requiring minA with use of slideboard from bed to chair going towards left. Placement of slide board by PT and cues for hand placement  Ambulation/Gait             General Gait Details: unable  Stairs            Wheelchair Mobility    Modified Rankin (Stroke Patients Only)       Balance Overall balance assessment: Needs assistance Sitting-balance support: Feet supported Sitting balance-Leahy Scale: Fair Sitting balance - Comments: min guard for sitting balance initially                                     Pertinent Vitals/Pain Pain Assessment: Faces Faces Pain Scale: Hurts even more Pain Location: surgical site, left side of groin (pt states this is chronic) Pain Descriptors / Indicators: Constant;Grimacing;Guarding Pain Intervention(s): Monitored during session;Patient requesting pain meds-RN notified;Repositioned    Home Living Family/patient expects to be discharged to:: Private residence Living Arrangements: Spouse/significant other Available Help at Discharge: Family;Available 24 hours/day Type of Home: House Home Access: Ramped entrance     Home Layout: Multi-level;Able to live on main level with bedroom/bathroom;Other (Comment)(stair lift) Home Equipment: Shower seat - built in;Walker - 2 wheels;Cane - single point;Wheelchair - manual;Tub bench      Prior Function Level of Independence: Needs assistance   Gait / Transfers Assistance Needed: uses wheelchair for mobility, performs lateral scoot transfers, slideboard transfers onto toilet and shower bench   ADL's / Homemaking Assistance Needed: requires assist for all  ADL's, self feeds        Hand Dominance        Extremity/Trunk Assessment   Upper Extremity Assessment Upper Extremity Assessment: Defer to OT evaluation    Lower  Extremity Assessment Lower Extremity Assessment: RLE deficits/detail;LLE deficits/detail RLE Deficits / Details: AKA; hip strength appears to be at least anti gravity LLE Deficits / Details: Strength 4/5 except hip flexion 3+/5       Communication   Communication: HOH  Cognition Arousal/Alertness: Awake/alert Behavior During Therapy: WFL for tasks assessed/performed Overall Cognitive Status: Within Functional Limits for tasks assessed                                 General Comments: Emotionally labile at times throughout session, but overall pleasant and appropriate      General Comments      Exercises     Assessment/Plan    PT Assessment Patient needs continued PT services  PT Problem List Decreased strength;Decreased activity tolerance;Decreased range of motion;Decreased balance;Decreased mobility;Decreased coordination;Impaired sensation;Pain       PT Treatment Interventions Functional mobility training;Therapeutic activities;Therapeutic exercise;Balance training;Patient/family education;Wheelchair mobility training    PT Goals (Current goals can be found in the Care Plan section)  Acute Rehab PT Goals Patient Stated Goal: "go home today or tomorrow." PT Goal Formulation: With patient Time For Goal Achievement: 11/24/18 Potential to Achieve Goals: Good    Frequency Min 5X/week   Barriers to discharge        Co-evaluation PT/OT/SLP Co-Evaluation/Treatment: Yes Reason for Co-Treatment: For patient/therapist safety;To address functional/ADL transfers           AM-PAC PT "6 Clicks" Mobility  Outcome Measure Help needed turning from your back to your side while in a flat bed without using bedrails?: A Little Help needed moving from lying on your back to sitting on the side of a flat bed without using bedrails?: A Lot Help needed moving to and from a bed to a chair (including a wheelchair)?: A Little Help needed standing up from a chair using your  arms (e.g., wheelchair or bedside chair)?: Total Help needed to walk in hospital room?: Total Help needed climbing 3-5 steps with a railing? : Total 6 Click Score: 11    End of Session Equipment Utilized During Treatment: Gait belt Activity Tolerance: Patient tolerated treatment well Patient left: in chair;with call bell/phone within reach Nurse Communication: Mobility status PT Visit Diagnosis: Other abnormalities of gait and mobility (R26.89);Muscle weakness (generalized) (M62.81);Pain Pain - Right/Left: Left Pain - part of body: Leg(neck)    Time: [id number] PT Time Calculation (min) (ACUTE ONLY): 35 min   Charges:   PT Evaluation $PT Eval Moderate Complexity: 1 Mod          Ellamae Sia, [place], DPT Acute Rehabilitation Services Pager [phone] Office [phone]   Willy Eddy 11/10/2018, 11:17 AM

## 2018-11-10 NOTE — Evaluation (Signed)
Occupational Therapy Evaluation Patient Details Name: [name] MRN: [id number] DOB: 1940/02/12 Today's Date: 11/10/2018    History of Present Illness Pt is a 78 y.o. M with significant PMH of CAD, Diabetes Mellitus Type 2, DVT, PE, stroke, right AKA 2019, back surgery who presents s/p C3-4 ACDF and C5 corpectomy.   Clinical Impression   Pt admitted with above diagnoses, presents with limitations in BUE strength and coordination (L>R), post surgical pain/precuations, hx of R AKA, and generalized weakness limiting ability to engage in BADL at desired level of ind. PTA, pt living with wife and dependent for most BADLs besides feeding (which has gotten more difficult with decreased [facility]). At time of eval, pt is limited by BUE weakness and requiring mod A +2 for side lying to sit. Education given throughout evaluation on post surgical back precautions. Initially tried lateral scoot to chair, pt with decreased activity tol to scoot and needing slide board with friction for min A transfer. Given current level of function, recommend CIR at d/c prior to returning home. Pt will benefit from acute OT services, will follow per POC below.      Follow Up Recommendations  CIR;Supervision/Assistance - 24 hour    Equipment Recommendations  None recommended by OT    Recommendations for Other Services       Precautions / Restrictions Precautions Precautions: Fall;Cervical;Other (comment) Precaution Booklet Issued: Yes (comment) Precaution Comments: R AKA Required Braces or Orthoses: Cervical Brace Cervical Brace: Hard collar;Other (comment)(apply while sitting) Restrictions Weight Bearing Restrictions: No      Mobility Bed Mobility Overal bed mobility: Needs Assistance Bed Mobility: Rolling;Sidelying to Sit Rolling: Min guard Sidelying to sit: Mod assist;+2 for safety/equipment       General bed mobility comments: Rolling towards right with cues to bend left knee and reach left arm over,  mod assist for trunk elevation up to sitting  Transfers Overall transfer level: Needs assistance Equipment used: Sliding board Transfers: Lateral/Scoot Transfers          Lateral/Scoot Transfers: Min assist General transfer comment: Attempted lateral scoot transfer initially without slide board but pt with decreased efficiency with scooting. Requiring minA with use of slideboard from bed to chair going towards left. Placement of slide board by therapist and cues for hand placement    Balance Overall balance assessment: Needs assistance Sitting-balance support: Feet supported Sitting balance-Leahy Scale: Fair Sitting balance - Comments: min guard for sitting balance initially                                   ADL either performed or assessed with clinical judgement   ADL Overall ADL's : Needs assistance/impaired Eating/Feeding: Minimal assistance;Sitting Eating/Feeding Details (indicate cue type and reason): reports dropping utensils freq Grooming: Minimal assistance;Sitting   Upper Body Bathing: Moderate assistance;Sitting   Lower Body Bathing: Maximal assistance;Sitting/lateral leans;Sit to/from stand;Adhering to back precautions   Upper Body Dressing : Moderate assistance;Sitting   Lower Body Dressing: Maximal assistance;Adhering to back precautions;Sitting/lateral leans;Sit to/from stand   Toilet Transfer: Minimal assistance;Transfer board;Requires drop arm;BSC   Toileting- Clothing Manipulation and Hygiene: Minimal assistance;Sitting/lateral lean;Sit to/from stand       Functional mobility during ADLs: Minimal assistance;+2 for safety/equipment(SPT with slide board) General ADL Comments: pt ltd 2/2 post surgical precautions, pain, and baseline phys comorbidities     Vision Patient Visual Report: No change from baseline       Perception  Praxis      Pertinent Vitals/Pain Pain Assessment: Faces Faces Pain Scale: Hurts even more Pain  Location: surgical site, left side of groin (pt states this is chronic) Pain Descriptors / Indicators: Constant;Grimacing;Guarding Pain Intervention(s): Limited activity within patient's tolerance;Monitored during session;Patient requesting pain meds-RN notified;Repositioned     Hand Dominance Right   Extremity/Trunk Assessment Upper Extremity Assessment Upper Extremity Assessment: RUE deficits/detail;LUE deficits/detail RUE Deficits / Details: 3/5, ltd by some capsular tightness; weak grasp RUE Coordination: decreased fine motor;decreased gross motor LUE Deficits / Details: 2+/5, more pronounced capsular tightness; trigger finger L ring finger, weak grasp LUE Coordination: decreased fine motor;decreased gross motor   Lower Extremity Assessment Lower Extremity Assessment: Defer to PT evaluation RLE Deficits / Details: AKA; hip strength appears to be at least anti gravity LLE Deficits / Details: Strength 4/5 except hip flexion 3+/5       Communication Communication Communication: HOH   Cognition Arousal/Alertness: Awake/alert Behavior During Therapy: WFL for tasks assessed/performed Overall Cognitive Status: Within Functional Limits for tasks assessed                                 General Comments: Emotionally labile at times throughout session, but overall pleasant and appropriate   General Comments       Exercises     Shoulder Instructions      Home Living Family/patient expects to be discharged to:: Private residence Living Arrangements: Spouse/significant other Available Help at Discharge: Family;Available 24 hours/day Type of Home: House Home Access: Ramped entrance     Home Layout: Multi-level;Able to live on main level with bedroom/bathroom;Other (Comment)(stair lift)     Bathroom Shower/Tub: Occupational psychologist: Handicapped height Bathroom Accessibility: Yes How Accessible: Accessible via wheelchair Home Equipment: [place] in;Walker - 2 wheels;Cane - single point;Wheelchair - manual;Tub bench          Prior Functioning/Environment Level of Independence: Needs assistance  Gait / Transfers Assistance Needed: uses wheelchair for mobility, performs lateral scoot transfers, slideboard transfers onto toilet and shower bench  ADL's / Homemaking Assistance Needed: requires assist for all ADL's, self feeds            OT Problem List: Decreased strength;Decreased coordination;Decreased range of motion;Decreased activity tolerance;Impaired balance (sitting and/or standing);Decreased knowledge of use of DME or AE;Impaired UE functional use;Pain      OT Treatment/Interventions: Self-care/ADL training;Therapeutic exercise;Therapeutic activities;Energy conservation;Patient/family education;DME and/or AE instruction;Balance training    OT Goals(Current goals can be found in the care plan section) Acute Rehab OT Goals Patient Stated Goal: "go home today or tomorrow." OT Goal Formulation: With patient Time For Goal Achievement: 11/24/18 Potential to Achieve Goals: Good  OT Frequency: Min 2X/week   Barriers to D/C:            Co-evaluation PT/OT/SLP Co-Evaluation/Treatment: Yes Reason for Co-Treatment: For patient/therapist safety;To address functional/ADL transfers PT goals addressed during session: Mobility/safety with mobility OT goals addressed during session: ADL's and self-care;Strengthening/ROM      AM-PAC OT "6 Clicks" Daily Activity     Outcome Measure Help from another person eating meals?: A Little Help from another person taking care of personal grooming?: A Little Help from another person toileting, which includes using toliet, bedpan, or urinal?: A Little Help from another person bathing (including washing, rinsing, drying)?: A Lot Help from another person to put on and taking off regular upper body clothing?: A Little  Help from another person to put on and taking off regular lower body  clothing?: A Lot 6 Click Score: 16   End of Session Equipment Utilized During Treatment: Gait belt;Other (comment)(slide board) Nurse Communication: Mobility status;Patient requests pain meds  Activity Tolerance: Patient tolerated treatment well Patient left: in chair;with call bell/phone within reach;with chair alarm set  OT Visit Diagnosis: Other abnormalities of gait and mobility (R26.89);Muscle weakness (generalized) (M62.81);Pain Pain - Right/Left: Left Pain - part of body: Leg(cervical incision site)                Time: [id number] OT Time Calculation (min): 35 min Charges:  OT General Charges $OT Visit: 1 Visit OT Evaluation $OT Eval Moderate Complexity: 1 Mod OT Treatments $Self Care/Home Management : (P) 8-22 mins  .Zenovia Jarred, MSOT, OTR/L Behavioral Health OT/ Acute Relief OT [facility] Office: [phone]   Zenovia Jarred 11/10/2018, 12:56 PM

## 2018-11-10 NOTE — Progress Notes (Signed)
Orthopedic Tech Progress Note Patient Details:  [name] 07-13-1940 [id number] Patient has on collar Patient ID: [name], male   DOB: [birth year], 78 y.o.   MRN: [id number]   [name] 11/10/2018, 8:36 AM

## 2018-11-10 NOTE — Progress Notes (Signed)
Patient ID: [name], male   DOB: August 10, 1940, 79 y.o.   MRN: [id number] Looks good POD1, some dysphagia, pain well controlled, hands seem to have more grip. Working with PT/OT

## 2018-11-11 ENCOUNTER — Other Ambulatory Visit: Payer: Self-pay

## 2018-11-11 ENCOUNTER — Inpatient Hospital Stay (HOSPITAL_COMMUNITY)
Admission: RE | Admit: 2018-11-11 | Discharge: 2018-11-23 | DRG: 560 | Disposition: A | Discharge status: Home Health | Payer: Medicare Other | Source: Intra-hospital | Attending: Physical Medicine & Rehabilitation | Admitting: Physical Medicine & Rehabilitation

## 2018-11-11 ENCOUNTER — Encounter (HOSPITAL_COMMUNITY): Payer: Self-pay

## 2018-11-11 DIAGNOSIS — Z981 Arthrodesis status: Secondary | ICD-10-CM | POA: Diagnosis not present

## 2018-11-11 DIAGNOSIS — E119 Type 2 diabetes mellitus without complications: Secondary | ICD-10-CM

## 2018-11-11 DIAGNOSIS — N390 Urinary tract infection, site not specified: Secondary | ICD-10-CM | POA: Diagnosis present

## 2018-11-11 DIAGNOSIS — M1612 Unilateral primary osteoarthritis, left hip: Secondary | ICD-10-CM | POA: Diagnosis present

## 2018-11-11 DIAGNOSIS — S78111A Complete traumatic amputation at level between right hip and knee, initial encounter: Secondary | ICD-10-CM | POA: Diagnosis not present

## 2018-11-11 DIAGNOSIS — Z8673 Personal history of transient ischemic attack (TIA), and cerebral infarction without residual deficits: Secondary | ICD-10-CM

## 2018-11-11 DIAGNOSIS — E8809 Other disorders of plasma-protein metabolism, not elsewhere classified: Secondary | ICD-10-CM

## 2018-11-11 DIAGNOSIS — I129 Hypertensive chronic kidney disease with stage 1 through stage 4 chronic kidney disease, or unspecified chronic kidney disease: Secondary | ICD-10-CM | POA: Diagnosis present

## 2018-11-11 DIAGNOSIS — D62 Acute posthemorrhagic anemia: Secondary | ICD-10-CM | POA: Diagnosis present

## 2018-11-11 DIAGNOSIS — Z89611 Acquired absence of right leg above knee: Secondary | ICD-10-CM | POA: Diagnosis not present

## 2018-11-11 DIAGNOSIS — Z7901 Long term (current) use of anticoagulants: Secondary | ICD-10-CM

## 2018-11-11 DIAGNOSIS — R739 Hyperglycemia, unspecified: Secondary | ICD-10-CM

## 2018-11-11 DIAGNOSIS — E46 Unspecified protein-calorie malnutrition: Secondary | ICD-10-CM | POA: Diagnosis not present

## 2018-11-11 DIAGNOSIS — T380X5A Adverse effect of glucocorticoids and synthetic analogues, initial encounter: Secondary | ICD-10-CM

## 2018-11-11 DIAGNOSIS — Z23 Encounter for immunization: Secondary | ICD-10-CM

## 2018-11-11 DIAGNOSIS — N1831 Chronic kidney disease, stage 3a: Secondary | ICD-10-CM | POA: Diagnosis not present

## 2018-11-11 DIAGNOSIS — E1165 Type 2 diabetes mellitus with hyperglycemia: Secondary | ICD-10-CM | POA: Diagnosis present

## 2018-11-11 DIAGNOSIS — M25559 Pain in unspecified hip: Secondary | ICD-10-CM

## 2018-11-11 DIAGNOSIS — Z7952 Long term (current) use of systemic steroids: Secondary | ICD-10-CM

## 2018-11-11 DIAGNOSIS — Z4789 Encounter for other orthopedic aftercare: Principal | ICD-10-CM

## 2018-11-11 DIAGNOSIS — B961 Klebsiella pneumoniae [K. pneumoniae] as the cause of diseases classified elsewhere: Secondary | ICD-10-CM | POA: Diagnosis present

## 2018-11-11 DIAGNOSIS — E0965 Drug or chemical induced diabetes mellitus with hyperglycemia: Secondary | ICD-10-CM | POA: Diagnosis not present

## 2018-11-11 DIAGNOSIS — N183 Chronic kidney disease, stage 3 unspecified: Secondary | ICD-10-CM | POA: Diagnosis present

## 2018-11-11 DIAGNOSIS — D631 Anemia in chronic kidney disease: Secondary | ICD-10-CM | POA: Diagnosis present

## 2018-11-11 DIAGNOSIS — M48061 Spinal stenosis, lumbar region without neurogenic claudication: Secondary | ICD-10-CM | POA: Diagnosis present

## 2018-11-11 DIAGNOSIS — C9210 Chronic myeloid leukemia, BCR/ABL-positive, not having achieved remission: Secondary | ICD-10-CM

## 2018-11-11 DIAGNOSIS — G9520 Unspecified cord compression: Secondary | ICD-10-CM | POA: Diagnosis not present

## 2018-11-11 DIAGNOSIS — G959 Disease of spinal cord, unspecified: Secondary | ICD-10-CM | POA: Diagnosis present

## 2018-11-11 DIAGNOSIS — R0989 Other specified symptoms and signs involving the circulatory and respiratory systems: Secondary | ICD-10-CM | POA: Diagnosis not present

## 2018-11-11 DIAGNOSIS — K592 Neurogenic bowel, not elsewhere classified: Secondary | ICD-10-CM | POA: Diagnosis present

## 2018-11-11 DIAGNOSIS — B9689 Other specified bacterial agents as the cause of diseases classified elsewhere: Secondary | ICD-10-CM | POA: Diagnosis not present

## 2018-11-11 DIAGNOSIS — Z993 Dependence on wheelchair: Secondary | ICD-10-CM | POA: Diagnosis not present

## 2018-11-11 DIAGNOSIS — Z8639 Personal history of other endocrine, nutritional and metabolic disease: Secondary | ICD-10-CM

## 2018-11-11 DIAGNOSIS — D649 Anemia, unspecified: Secondary | ICD-10-CM

## 2018-11-11 DIAGNOSIS — E1122 Type 2 diabetes mellitus with diabetic chronic kidney disease: Secondary | ICD-10-CM | POA: Diagnosis present

## 2018-11-11 DIAGNOSIS — Z79899 Other long term (current) drug therapy: Secondary | ICD-10-CM | POA: Diagnosis not present

## 2018-11-11 DIAGNOSIS — M792 Neuralgia and neuritis, unspecified: Secondary | ICD-10-CM | POA: Diagnosis not present

## 2018-11-11 DIAGNOSIS — N3941 Urge incontinence: Secondary | ICD-10-CM

## 2018-11-11 DIAGNOSIS — I10 Essential (primary) hypertension: Secondary | ICD-10-CM

## 2018-11-11 DIAGNOSIS — G7000 Myasthenia gravis without (acute) exacerbation: Secondary | ICD-10-CM | POA: Diagnosis present

## 2018-11-11 DIAGNOSIS — N1832 Chronic kidney disease, stage 3b: Secondary | ICD-10-CM

## 2018-11-11 DIAGNOSIS — M25852 Other specified joint disorders, left hip: Secondary | ICD-10-CM | POA: Diagnosis not present

## 2018-11-11 LAB — URINALYSIS, ROUTINE W REFLEX MICROSCOPIC
Bilirubin Urine: NEGATIVE
Glucose, UA: 50 mg/dL — AB
Hgb urine dipstick: NEGATIVE
Ketones, ur: NEGATIVE mg/dL
Nitrite: NEGATIVE
Protein, ur: NEGATIVE mg/dL
Specific Gravity, Urine: 1.013 (ref 1.005–1.030)
pH: 6 (ref 5.0–8.0)

## 2018-11-11 MED ORDER — FUROSEMIDE 40 MG PO TABS
40.0000 mg | ORAL_TABLET | Freq: Every day | ORAL | Status: DC
  Administered 2018-11-12 – 2018-11-21 (×10): 40 mg via ORAL
  Filled 2018-11-11 (×10): qty 1

## 2018-11-11 MED ORDER — PREDNISONE 1 MG PO TABS
1.0000 mg | ORAL_TABLET | Freq: Every day | ORAL | Status: DC
  Administered 2018-11-12 – 2018-11-23 (×12): 1 mg via ORAL
  Filled 2018-11-11 (×12): qty 1

## 2018-11-11 MED ORDER — METHOCARBAMOL 500 MG PO TABS
500.0000 mg | ORAL_TABLET | Freq: Four times a day (QID) | ORAL | Status: DC | PRN
  Administered 2018-11-12 – 2018-11-23 (×10): 500 mg via ORAL
  Filled 2018-11-11 (×10): qty 1

## 2018-11-11 MED ORDER — DEXAMETHASONE 4 MG PO TABS
2.0000 mg | ORAL_TABLET | Freq: Four times a day (QID) | ORAL | Status: DC
  Administered 2018-11-11: 12:00:00 2 mg via ORAL
  Filled 2018-11-11: qty 1

## 2018-11-11 MED ORDER — DEXAMETHASONE 2 MG PO TABS
2.0000 mg | ORAL_TABLET | Freq: Four times a day (QID) | ORAL | Status: DC
  Administered 2018-11-11 – 2018-11-15 (×16): 2 mg via ORAL
  Filled 2018-11-11 (×16): qty 1

## 2018-11-11 MED ORDER — ONDANSETRON HCL 4 MG PO TABS: 4.0000 mg | ORAL_TABLET | Freq: Four times a day (QID) | ORAL | Status: DC | PRN

## 2018-11-11 MED ORDER — ONDANSETRON HCL 4 MG/2ML IJ SOLN: 4.0000 mg | Freq: Four times a day (QID) | INTRAMUSCULAR | Status: DC | PRN

## 2018-11-11 MED ORDER — TIMOLOL MALEATE 0.5 % OP SOLN
1.0000 [drp] | Freq: Two times a day (BID) | OPHTHALMIC | Status: DC
  Administered 2018-11-11 – 2018-11-23 (×24): 1 [drp] via OPHTHALMIC
  Filled 2018-11-11: qty 5

## 2018-11-11 MED ORDER — DEXAMETHASONE SODIUM PHOSPHATE 4 MG/ML IJ SOLN: 2.0000 mg | Freq: Four times a day (QID) | INTRAMUSCULAR | Status: DC

## 2018-11-11 MED ORDER — PYRIDOSTIGMINE BROMIDE 60 MG PO TABS
60.0000 mg | ORAL_TABLET | Freq: Every day | ORAL | Status: DC
  Administered 2018-11-12 – 2018-11-23 (×12): 60 mg via ORAL
  Filled 2018-11-11 (×12): qty 1

## 2018-11-11 MED ORDER — APIXABAN 5 MG PO TABS
5.0000 mg | ORAL_TABLET | Freq: Two times a day (BID) | ORAL | Status: DC
  Administered 2018-11-16: 08:00:00 5 mg via ORAL
  Filled 2018-11-11: qty 1

## 2018-11-11 MED ORDER — PROSIGHT PO TABS
1.0000 | ORAL_TABLET | Freq: Every day | ORAL | Status: DC
  Administered 2018-11-12 – 2018-11-23 (×12): 1 via ORAL
  Filled 2018-11-11 (×12): qty 1

## 2018-11-11 MED ORDER — METHOCARBAMOL 1000 MG/10ML IJ SOLN
500.0000 mg | Freq: Four times a day (QID) | INTRAVENOUS | Status: DC | PRN
  Filled 2018-11-11: qty 5

## 2018-11-11 MED ORDER — SORBITOL 70 % SOLN
30.0000 mL | Freq: Every day | Status: DC | PRN
  Filled 2018-11-11: qty 30

## 2018-11-11 MED ORDER — OXYCODONE HCL 5 MG PO TABS
5.0000 mg | ORAL_TABLET | ORAL | Status: DC | PRN
  Administered 2018-11-11 – 2018-11-23 (×28): 5 mg via ORAL
  Filled 2018-11-11 (×31): qty 1

## 2018-11-11 MED ORDER — ISOSORBIDE MONONITRATE ER 30 MG PO TB24
30.0000 mg | ORAL_TABLET | Freq: Every day | ORAL | Status: DC
  Administered 2018-11-12 – 2018-11-23 (×12): 30 mg via ORAL
  Filled 2018-11-11 (×12): qty 1

## 2018-11-11 MED ORDER — DIPHENOXYLATE-ATROPINE 2.5-0.025 MG PO TABS: 1.0000 | ORAL_TABLET | Freq: Four times a day (QID) | ORAL | Status: DC | PRN

## 2018-11-11 MED ORDER — BRIMONIDINE TARTRATE 0.2 % OP SOLN
1.0000 [drp] | Freq: Two times a day (BID) | OPHTHALMIC | Status: DC
  Administered 2018-11-11 – 2018-11-23 (×24): 1 [drp] via OPHTHALMIC
  Filled 2018-11-11: qty 5

## 2018-11-11 MED ORDER — SENNA 8.6 MG PO TABS
1.0000 | ORAL_TABLET | Freq: Two times a day (BID) | ORAL | Status: DC
  Administered 2018-11-11 – 2018-11-23 (×23): 8.6 mg via ORAL
  Filled 2018-11-11 (×24): qty 1

## 2018-11-11 MED ORDER — ACETAMINOPHEN 325 MG PO TABS
650.0000 mg | ORAL_TABLET | ORAL | Status: DC | PRN
  Administered 2018-11-12 – 2018-11-21 (×6): 650 mg via ORAL
  Filled 2018-11-11 (×8): qty 2

## 2018-11-11 MED ORDER — ACETAMINOPHEN 650 MG RE SUPP: 650.0000 mg | RECTAL | Status: DC | PRN

## 2018-11-11 NOTE — Progress Notes (Signed)
Pt admitted to 4W, brought in via wheelchair by nurse and NT. Spouse carrying personal belongings. Pt and spouse educated on visitor policy, fall policy, and personal belongings. Glasses, hearing aids, and cell phone at bedside. Pt in agreement of all policies and procedures. Pt left in bed with call bed within reach,  bed in lowest position. Will continue to monitor. Larina Earthly, LPN

## 2018-11-11 NOTE — Progress Notes (Addendum)
Tristan Staggers, MD  Physician  Physical Medicine and Rehabilitation  PMR Pre-admission  Signed  Date of Service:  11/11/2018 10:35 AM      Related encounter: Admission (Current) from 11/09/2018 in [facility]         Show:Clear all _0 Manual_1 Template_2 Copied  Added by: _3 Tristan Staggers, MD_4 [name], PT  _5 Hover for details PMR Admission Coordinator Pre-Admission Assessment  Patient: [name] is an 78 y.o., male MRN: [id number] DOB: 01-20-1941 Height: _6  (167.6 cm) Weight: 67.1 kg  Insurance Information HMO:     PPO:      PCP:      IPA:      80/20:      OTHER:  PRIMARY: Medicare A and B      Policy#: [id number]      Subscriber: patient CM Name:       Phone#:      Fax#:  Pre-Cert#: verified eligibility online      Employer: n/a Benefits:  Phone #:      Name:  Eff. Date: 04/10/03 (A and B)     Deduct: $1408      Out of Pocket Max:       Life Max:  CIR: 100%      SNF: 20 full days Outpatient:      Co-Pay:  Home Health:       Co-Pay:  DME:      Co-Pay:  Providers:  SECONDARY: [facility]      Policy#: [id number]      Subscriber:  CM Name:       Phone#:      Fax#:  Pre-Cert#:       Employer:  Benefits:  Phone #:      Name:  Eff. Date:      Deduct:       Out of Pocket Max:       Life Max:  CIR:       SNF:  Outpatient:      Co-Pay:  Home Health:       Co-Pay:  DME:      Co-Pay:   Medicaid Application Date:       Case Manager:  Disability Application Date:       Case Worker:   The "Data Collection Information Summary" for patients in Inpatient Rehabilitation Facilities with attached "[place] Records" was provided and verbally reviewed with: Patient  Emergency Contact Information         Contact Information    Name Relation Home Work Mobile   [name],Tristan Spouse [phone]  [phone]      Current Medical History  Patient Admitting Diagnosis: cervical myelopathy, 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 Patient primarily uses a wheelchair for mobility performed lateral scoot transfers and sliding board transfers onto toilet and shower bench. He had been using his prosthesis but no longer could because of upper extremity weakness. Wife did assist with some ADLs. Presented 11/09/2018 with complaints of neck pain, numbness bilateral upper extremities and weakness that is progressed over the last several months. X-rays and imaging revealed herniated nucleus pulposuswith  spondylosis/stenosis at C3-4 C-4-6 with severe myelopathy. Underwent decompressive anterior cervical discectomy C3-4 with anterior cervical corpectomy anterior cervical arthrodesis C3-4, 4-5 and C5-6 11/09/2018 per Dr. Sherley [name]. Hospital course pain management. Soft cervical collar as directed. Acute blood loss anemia 9.9. Decadron protocol as indicated. Chronic Eliquis to be held 7 days from day of surgery and then could be resumed. Tolerating a regular diet. Therapy evaluations completed and patient was recommended for a comprehensive rehab program.    Patient's medical record from [facility] has been reviewed by the rehabilitation admission coordinator and physician.  Past Medical History      Past Medical History:  Diagnosis Date  . Bruises easily    d/t being on Eliquis  . Carotid artery disease ([place])    a. Duplex 05/2014: 82-80% RICA, 0-34% LICA, elevated velocities in right subclavian artery, normal left subclavian artery..  . Chronic back pain    HNP  . Chronic kidney  disease (CKD)   . CKD (chronic kidney disease)   . Claustrophobia    takes Ativan as needed  . 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 Coronary atherosclerosis of unspecified type of vessel, native or graft    a. cath in 2003 showed 10-20% LM, scattered 20% prox-mid LAD, 30% more distal LAD, 50-60% stenosis of LAD towards apex, 20% Cx, 30% dRCA, focal 95% stenosis in smaller of 2 branches of PDA, EF 60-65%.  . Diabetes mellitus ([place])    no longer- cancxelled by Dr Hale Bogus  . Diarrhea    takes Imodium daily as needed  . Diverticulosis   . Diverticulosis of colon (without mention of hemorrhage)   . DJD (degenerative joint disease)   . DVT (deep venous thrombosis) ([place]) 07/2011  . Esophageal stricture   . Essential hypertension    takes Imdur and Lisinopril daily  . Gastric polyps    benign  . GERD (gastroesophageal reflux disease)    takes Nexium daily  . Glaucoma    uses eye drops  . History of blood transfusion `   leg amputation  . History of bronchitis    not sure when the last time  . History of colon polyps    benign  . History of kidney stones    litrotrispspy  . History of kidney stones    passed  . Insomnia    takes Melatonin nightly as needed  . Mixed hyperlipidemia    takes Crestor daily  . Myasthenia gravis ([place])    uses prednisone  . Osteoporosis 2016  . Peripheral edema    takes Lasix daily  . Peripheral vascular disease ([place])   . Pituitary tumor    checks Dr[facility]Walden at [facility] checks it every Jan   . Pneumonia 01/2016  . PONV (postoperative nausea and vomiting)   . Ptosis   . Pulmonary embolus ([place]) 2012  . Stroke ([place]) 06/2015   x 3- last one 2018- balance issuses    Family History   family history includes Breast cancer in his sister; Clotting disorder in his sister; Dementia in his sister; Heart attack in his father, mother, sister, sister, and sister; Heart disease in his  father, mother, and sister; Stroke in his father and mother.  Prior Rehab/Hospitalizations Has the patient had prior rehab or hospitalizations prior to admission? Yes  Has the patient had major surgery during 100 days prior to admission? Yes             Current  Medications  Current Facility-Administered Medications:  .  0.9 %  sodium chloride infusion, 250 mL, Intravenous, Continuous, [name], MD .  0.9 % NaCl with KCl 20 mEq/ L  infusion, , Intravenous, Continuous, [name], MD, Last Rate: 10 mL/hr at 11/10/18 1034 .  acetaminophen (TYLENOL) tablet 650 mg, 650 mg, Oral, Q4H PRN, 650 mg at 11/11/18 0532 **OR** acetaminophen (TYLENOL) suppository 650 mg, 650 mg, Rectal, Q4H PRN, [name], MD .  brimonidine (ALPHAGAN) 0.2 % ophthalmic solution 1 drop, 1 drop, Both Eyes, BID, [name], MD, 1 drop at 11/11/18 1031 .  dexamethasone (DECADRON) injection 2 mg, 2 mg, Intravenous, Q6H **OR** dexamethasone (DECADRON) tablet 2 mg, 2 mg, Oral, Q6H, [name], MD .  diphenoxylate-atropine (LOMOTIL) 2.5-0.025 MG per tablet 1 tablet, 1 tablet, Oral, Q6H PRN, [name], MD .  furosemide (LASIX) tablet 40 mg, 40 mg, Oral, Daily, [name], MD, 40 mg at 11/11/18 1030 .  isosorbide mononitrate (IMDUR) 24 hr tablet 30 mg, 30 mg, Oral, Daily, [name], MD, 30 mg at 11/11/18 1030 .  loperamide (IMODIUM) capsule 2 mg, 2 mg, Oral, Q6H PRN, [name], MD .  menthol-cetylpyridinium (CEPACOL) lozenge 3 mg, 1 lozenge, Oral, PRN **OR** phenol (CHLORASEPTIC) mouth spray 1 spray, 1 spray, Mouth/Throat, PRN, [name], MD .  methocarbamol (ROBAXIN) tablet 500 mg, 500 mg, Oral, Q6H PRN, 500 mg at 11/11/18 0530 **OR** methocarbamol (ROBAXIN) 500 mg in dextrose 5 % 50 mL IVPB, 500 mg, Intravenous, Q6H PRN, [name], MD .  morphine 2 MG/ML injection 2 mg, 2 mg, Intravenous, Q2H PRN, [name], MD, 2 mg at 11/10/18 0011 .  multivitamin (PROSIGHT) tablet 1  tablet, 1 tablet, Oral, Daily, [name], MD, 1 tablet at 11/11/18 1030 .  ondansetron (ZOFRAN) tablet 4 mg, 4 mg, Oral, Q6H PRN **OR** ondansetron (ZOFRAN) injection 4 mg, 4 mg, Intravenous, Q6H PRN, [name], MD .  oxyCODONE (Oxy IR/ROXICODONE) immediate release tablet 5 mg, 5 mg, Oral, Q3H PRN, [name], MD, 5 mg at 11/11/18 0529 .  predniSONE (DELTASONE) tablet 1 mg, 1 mg, Oral, Q breakfast, [name], MD, 1 mg at 11/11/18 0841 .  pyridostigmine (MESTINON) tablet 60 mg, 60 mg, Oral, Daily, [name], MD, 60 mg at 11/10/18 1014 .  senna (SENOKOT) tablet 8.6 mg, 1 tablet, Oral, BID, [name], MD, 8.6 mg at 11/11/18 1030 .  sodium chloride flush (NS) 0.9 % injection 3 mL, 3 mL, Intravenous, Q12H, [name], MD, 3 mL at 11/10/18 2219 .  sodium chloride flush (NS) 0.9 % injection 3 mL, 3 mL, Intravenous, PRN, [name], MD .  timolol (TIMOPTIC) 0.5 % ophthalmic solution 1 drop, 1 drop, Both Eyes, BID, [name], MD, 1 drop at 11/10/18 2215  Patients Current Diet:     Diet Order                  Diet regular Room service appropriate? Yes; Fluid consistency: Thin  Diet effective now               Precautions / Restrictions Precautions Precautions: Fall, Cervical, Other (comment) Precaution Booklet Issued: Yes (comment) Precaution Comments: Rt AKA Cervical Brace: Soft collar Restrictions Weight Bearing Restrictions: No   Has the patient had 2 or more falls or a fall with injury in the past year? Yes  Prior Activity Level Limited Community (  1-2x/wk): limited mobility since AKA, also with worsening BLE/BUE weakness since July/August  Prior Functional Level Self Care: Did the patient need help bathing, dressing, using the toilet or eating? Needed some help  Indoor Mobility: Did the patient need assistance with walking from room to room (with or without device)? Needed some help  Stairs: Did the patient need assistance with  internal or external stairs (with or without device)? Dependent  Functional Cognition: Did the patient need help planning regular tasks such as shopping or remembering to take medications? Independent  Development worker, international aid / Equipment Home Equipment: Shower seat - built in, [place] - 2 wheels, [place] - single point, Wheelchair - manual, Tub bench  Prior Device Use: Indicate devices/aids used by the patient prior to current illness, exacerbation or injury? Manual wheelchair, Environmental consultant, Orthotics/Prosthetics and slide board  Current Functional Level Cognition  Overall Cognitive Status: Within Functional Limits for tasks assessed Orientation Level: Oriented X4 General Comments: Emotionally labile at times throughout session, but overall pleasant and appropriate    Extremity Assessment (includes Sensation/Coordination)  Upper Extremity Assessment: RUE deficits/detail, LUE deficits/detail RUE Deficits / Details: 3/5, ltd by some capsular tightness; weak grasp RUE Coordination: decreased fine motor, decreased gross motor LUE Deficits / Details: 2+/5, more pronounced capsular tightness; trigger finger L ring finger, weak grasp LUE Coordination: decreased fine motor, decreased gross motor  Lower Extremity Assessment: Defer to PT evaluation RLE Deficits / Details: AKA; hip strength appears to be at least anti gravity LLE Deficits / Details: Strength 4/5 except hip flexion 3+/5    ADLs  Overall ADL's : Needs assistance/impaired Eating/Feeding: Minimal assistance, Sitting Eating/Feeding Details (indicate cue type and reason): reports dropping utensils freq Grooming: Minimal assistance, Sitting Upper Body Bathing: Moderate assistance, Sitting Lower Body Bathing: Maximal assistance, Sitting/lateral leans, Sit to/from stand, Adhering to back precautions Upper Body Dressing : Moderate assistance, Sitting Lower Body Dressing: Maximal assistance, Adhering to back precautions, Sitting/lateral  leans, Sit to/from stand Toilet Transfer: Minimal assistance, Transfer board, Requires drop arm, BSC Toileting- Clothing Manipulation and Hygiene: Minimal assistance, Sitting/lateral lean, Sit to/from stand Functional mobility during ADLs: Minimal assistance, +2 for safety/equipment(SPT with slide board) General ADL Comments: pt ltd 2/2 post surgical precautions, pain, and baseline phys comorbidities    Mobility  Overal bed mobility: Needs Assistance Bed Mobility: Rolling, Sidelying to Sit Rolling: Min guard Sidelying to sit: Mod assist, HOB elevated General bed mobility comments: Rolling towards right to reach for rail, assist with trunk to get to EOB and to scoot forward.    Transfers  Overall transfer level: Needs assistance Equipment used: None Transfers: Lateral/Scoot Transfers  Lateral/Scoot Transfers: Min assist General transfer comment: Able to laterally scoot towards left side with cues for anterior weight shift; light MIn A needed to scoot bottom otherwise did pretty well. Did not need slide board today.    Ambulation / Gait / Stairs / Wheelchair Mobility  Ambulation/Gait General Gait Details: unable    Posture / Balance Dynamic Sitting Balance Sitting balance - Comments: Supervision for sitting balance Balance Overall balance assessment: Needs assistance Sitting-balance support: (foot supported) Sitting balance-Leahy Scale: Fair Sitting balance - Comments: Supervision for sitting balance    Special needs/care consideration BiPAP/CPAP no CPM no Continuous Drip IV no Dialysis no        Days n/a Life Vest no Oxygen no Special Bed no Trach Size no Wound Vac (area) no      Location n/a Skin surgical incisions to neck  Bowel mgmt: no BM since admission, typically continent Bladder mgmt: typically continent, now with condom cath 2/2 incontinence Diabetic mgmt: no Behavioral consideration no Chemo/radiation no   Previous Home  Environment (from acute therapy documentation) Living Arrangements: Spouse/significant other Available Help at Discharge: Family, Available 24 hours/day Type of Home: House Home Layout: Multi-level, Able to live on main level with bedroom/bathroom, Other (Comment)(stair lift) Home Access: Ramped entrance Bathroom Shower/Tub: Multimedia programmer: Handicapped height Bathroom Accessibility: Yes How Accessible: Accessible via wheelchair  Discharge Living Setting Plans for Discharge Living Setting: Patient's home Type of Home at Discharge: House Discharge Home Layout: Multi-level, Able to live on main level with bedroom/bathroom(stair lift to other floors) Alternate Level Stairs-Number of Steps: 9 Discharge Home Access: Ramped entrance Discharge Bathroom Shower/Tub: Tub/shower unit Discharge Bathroom Toilet: Standard Discharge Bathroom Accessibility: Yes How Accessible: Accessible via wheelchair Does the patient have any problems obtaining your medications?: No  Social/Family/Support Systems Patient Roles: Spouse Anticipated Caregiver: Hassan Rowan Anticipated Caregiver's Contact Information: [phone] Ability/Limitations of Caregiver: min assist Caregiver Availability: 24/7 Discharge Plan Discussed with Primary Caregiver: Yes Is Caregiver In Agreement with Plan?: Yes Does Caregiver/Family have Issues with Lodging/Transportation while Pt is in Rehab?: No  Goals/Additional Needs Patient/Family Goal for Rehab: PT/OT supervision Expected length of stay: 10-14 days Dietary Needs: reg/thin Equipment Needs: uses slide board and primary w/c mobility prior to admission Pt/Family Agrees to Admission and willing to participate: Yes Program Orientation Provided & Reviewed with Pt/Caregiver Including Roles  & Responsibilities: Yes  Decrease burden of Care through IP rehab admission: n/a  Possible need for SNF placement upon discharge: No. Pt and family familiar with CIR  services and plan for d/c home with family support when program complete.    Patient Condition: I have reviewed medical records from [facility], spoken with CM, and patient and spouse. I met with patient at the bedside for inpatient rehabilitation assessment.  Patient will benefit from ongoing PT and OT, can actively participate in 3 hours of therapy a day 5 days of the week, and can make measurable gains during the admission.  Patient will also benefit from the coordinated team approach during an Inpatient Acute Rehabilitation admission.  The patient will receive intensive therapy as well as Rehabilitation physician, nursing, social worker, and care management interventions.  Due to bladder management, bowel management, safety, skin/wound care, disease management, medication administration, pain management and patient education the patient requires 24 hour a day rehabilitation nursing.  The patient is currently min to mod assist with mobility and basic ADLs.  Discharge setting and therapy post discharge at home with home health is anticipated.  Patient has agreed to participate in the Acute Inpatient Rehabilitation Program and will admit today.  Preadmission Screen Completed By:  [name], PT, DPT 11/11/2018 10:35 AM ______________________________________________________________________   Discussed status with Dr. Naaman Plummer on 11/11/18  at 10:43 AM  and received approval for admission today.  Admission Coordinator:  [name], PT, DPT 10:43 AM Sudie Grumbling 11/11/18    Assessment/Plan: Diagnosis: cervical myelopath s/p acdf 1. Does the need for close, 24 hr/day Medical supervision in concert with the patient's rehab needs make it unreasonable for this patient to be served in a less intensive setting? Yes 2. Co-Morbidities requiring supervision/potential complications: cad, mg, hx right aka 3. Due to bladder management, bowel management, safety, skin/wound care, disease management,  medication administration, pain management and patient education, does the patient require 24 hr/day rehab nursing? Yes 4. Does the patient require  coordinated care of a physician, rehab nurse, PT (1-2 hrs/day, 5 days/week) and OT (1-2 hrs/day, 5 days/week) to address physical and functional deficits in the context of the above medical diagnosis(es)? Yes Addressing deficits in the following areas: balance, endurance, locomotion, strength, transferring, bowel/bladder control, bathing, dressing, feeding, grooming, toileting and psychosocial support 5. Can the patient actively participate in an intensive therapy program of at least 3 hrs of therapy 5 days a week? Yes 6. The potential for patient to make measurable gains while on inpatient rehab is excellent 7. Anticipated functional outcomes upon discharge from inpatients are: supervision PT, supervision OT, n/a SLP 8. Estimated rehab length of stay to reach the above functional goals is: 10-14 days 9. Anticipated D/C setting: Home 10. Anticipated post D/C treatments: [place] therapy 11. Overall Rehab/Functional Prognosis: excellent  MD Signature: Tristan Staggers, MD, [place] [facility] 11/11/2018         Revision History

## 2018-11-11 NOTE — Progress Notes (Signed)
Orthopedic Tech Progress Note Patient Details:  [name] Mar 07, 1940 [id number]  Ortho Devices Type of Ortho Device: Soft collar Ortho Device/Splint Location: neck Ortho Device/Splint Interventions: Adjustment, Application, Ordered   Post Interventions Patient Tolerated: Well Instructions Provided: Care of device, Adjustment of device   [name] 11/11/2018, 8:25 AM

## 2018-11-11 NOTE — H&P (Signed)
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  Per chart review patient lives with spouse.  Multilevel level home with ramped entrance in bed and bath on main level.  Patient primarily uses a wheelchair for mobility performed lateral scoot transfers and sliding board transfers onto toilet and shower bench.  He had been using his prosthesis but no longer could because of upper extremity weakness.  Wife did assist with some ADLs.  Presented 11/09/2018 with complaints of neck pain, numbness bilateral upper extremities and weakness that is progressed over the last several months.  X-rays and imaging revealed herniated nucleus pulposus with spondylosis/stenosis at C3- 4 C-4-6 with severe myelopathy.  Underwent decompressive anterior cervical discectomy C3-4 with anterior cervical corpectomy anterior cervical arthrodesis C3-4, 4-5 and C5-6 11/09/2018 per Dr. Sherley Bounds.  Hospital course pain management.  Soft cervical collar as directed.  Acute blood loss anemia 9.9.  Decadron protocol as indicated.  Chronic Eliquis to be held 7 days from day of surgery and then could be resumed..  Tolerating a regular diet.  Therapy evaluation completed and patient was admitted for a comprehensive rehab program.   Review of Systems  Constitutional: Negative for chills and fever.  HENT: Negative for hearing loss.   Eyes: Positive for blurred vision. Negative for double vision.  Respiratory: Negative for cough and shortness of breath.   Cardiovascular: Positive for leg swelling. Negative for chest pain and palpitations.   Gastrointestinal: Positive for constipation. Negative for heartburn, nausea and vomiting.       GERD  Genitourinary: Negative for dysuria and hematuria.  Musculoskeletal: Positive for back pain, joint pain and myalgias.  Skin: Negative for rash.  Psychiatric/Behavioral: The patient has insomnia.   All other systems reviewed and are negative.   Past Medical History:  Diagnosis Date  . Bruises easily      d/t being on Eliquis  . Carotid artery disease ([place])      a. Duplex 05/2014: 123456 RICA, 123456 LICA, elevated velocities in right subclavian artery, normal left subclavian artery..  . Chronic back pain      HNP  . Chronic kidney disease (CKD)    . CKD (chronic kidney disease)    . Claustrophobia      takes Ativan as needed  . CML (chronic myeloid leukemia) ([place])      takes Gleevec daily  . Coronary atherosclerosis of unspecified type of vessel, native or graft      a. cath in 2003 showed 10-20% LM, scattered 20% prox-mid LAD, 30% more distal LAD, 50-60% stenosis of LAD towards apex, 20% Cx, 30% dRCA, focal 95% stenosis in smaller of 2 branches of PDA, EF 60-65%.  . Diabetes mellitus ([place])      no longer- cancxelled by Dr Hale Bogus  . Diarrhea      takes Imodium daily as needed  . Diverticulosis    . Diverticulosis of colon (without mention of hemorrhage)    . DJD (degenerative joint disease)    . DVT (deep venous thrombosis) ([place]) 07/2011  . Esophageal stricture    . Essential hypertension      takes  Imdur and Lisinopril daily  . Gastric polyps      benign  . GERD (gastroesophageal reflux disease)      takes Nexium daily  . Glaucoma      uses eye drops  . History of blood transfusion `    leg amputation  . History of bronchitis      not sure when the last time  . History of colon polyps      benign  . History of kidney stones      litrotrispspy  . History of kidney stones      passed  . Insomnia      takes Melatonin nightly as needed  . Mixed hyperlipidemia       takes Crestor daily  . Myasthenia gravis ([place])      uses prednisone  . Osteoporosis 2016  . Peripheral edema      takes Lasix daily  . Peripheral vascular disease ([place])    . Pituitary tumor      [facility]den [facility] checks it every Jan   . Pneumonia 01/2016  . PONV (postoperative nausea and vomiting)    . Ptosis    . Pulmonary embolus ([place]) 2012  . Stroke ([place]) 06/2015    x 3- last one 2018- balance issuses        Past Surgical History:  Procedure Laterality Date  . ABDOMINAL AORTOGRAM W/LOWER EXTREMITY N/A 11/18/2016    Procedure: ABDOMINAL AORTOGRAM W/LOWER EXTREMITY;  Surgeon: [name], MD;  Location: [place] [facility];  Service: Cardiovascular;  Laterality: N/A;  . AMPUTATION Right 02/07/2018    Procedure: right leg AMPUTATION ABOVE KNEE;  Surgeon: [name], MD;  Location: [place];  Service: Vascular;  Laterality: Right;  . ANTERIOR CERVICAL DECOMP/DISCECTOMY FUSION N/A 11/09/2018    Procedure: CERVICAL THREE-FOUR ANTERIOR CERVICAL DECOMPRESSION/DISCECTOMY FUSION WITH CERVICAL FIVE CORPECTOMY;  Surgeon: [name], MD;  Location: [place];  Service: Neurosurgery;  Laterality: N/A;  . AORTOGRAM Right 12/23/2017    Procedure: AORTOGRAM, RIGHT LOWER EXTREMITY ANGIOGRAM, RIGHT SUPERFICIAL FEMORAL ARTERY ANGIOPLASTY WITH STENT, RIGHT POPLITEAL ARTERY ANGIOPLASTY WITH STENT, LEFT GROIN CUTDOWN AND LEFT FEMORAL ARTERY REPAIR;  Surgeon: [name], MD;  Location: [place];  Service: Vascular;  Laterality: Right;  . BACK SURGERY        x2, lumbar and cervical  . CARDIAC CATHETERIZATION      . CARPAL TUNNEL RELEASE Left 09/23/2018    Procedure: CARPAL TUNNEL RELEASE;  Surgeon: [name], MD;  Location: [place];  Service: Neurosurgery;  Laterality: Left;  CARPAL TUNNEL RELEASE  . CATARACT EXTRACTION Bilateral 12/12  . COLONOSCOPY      . ESOPHAGOGASTRODUODENOSCOPY ENDOSCOPY   04/2015  . IR GENERIC HISTORICAL   03/03/2016    IR IVC FILTER PLMT / S&I [name]  GUID/MOD SED 03/03/2016 [name], MD [facility]  . IR GENERIC HISTORICAL   04/09/2016    IR RADIOLOGIST EVAL & MGMT 04/09/2016 [name], MD [facility]  . IR GENERIC HISTORICAL   04/30/2016    IR IVC FILTER RETRIEVAL / S&I [name] GUID/MOD SED 04/30/2016 [name], MD [facility]  . LITHOTRIPSY      . LUMBAR LAMINECTOMY/DECOMPRESSION MICRODISCECTOMY Right 09/14/2012    Procedure: Right Lumbar three-four, four-five, Lumbar five-Sacral one decompressive laminectomy;  Surgeon: [name], MD;  Location: [place] [facility];  Service: Neurosurgery;  Laterality: Right;  . LUMBAR LAMINECTOMY/DECOMPRESSION MICRODISCECTOMY Left 03/11/2016    Procedure: Left Lumbar Two-ThreeMicrodiscectomy;  Surgeon: [name], MD;  Location: [place];  Service: Neurosurgery;  Laterality: Left;  . PERIPHERAL VASCULAR INTERVENTION   11/18/2016    Procedure: PERIPHERAL VASCULAR INTERVENTION;  Surgeon: [name], MD;  Location: [place] [facility];  Service: Cardiovascular;;  Left popliteal  . SHOULDER SURGERY Left 05/07/2009  . TONSILLECTOMY      . ULNAR NERVE TRANSPOSITION Left 09/23/2018    Procedure: ULNAR NERVE DECOMPRESSION/TRANSPOSITION;  Surgeon: [name], MD;  Location: [place];  Service: Neurosurgery;  Laterality: Left;  ULNAR NERVE DECOMPRESSION/TRANSPOSITION        Family History  Problem Relation Age of Onset  . Heart disease Mother    . Heart attack Mother    . Stroke Mother    . Heart disease Father    . Heart attack Father    . Stroke Father    . Breast cancer Sister          Twin   . Heart attack Sister    . Dementia Sister    . Heart disease Sister    . Heart attack Sister    . Clotting disorder Sister    . Heart attack Sister    . Colon cancer Neg Hx     Social History:  reports that he has never smoked. He has never used smokeless tobacco. He reports that he does not drink alcohol or use drugs. Allergies:       Allergies  Allergen Reactions  . Other Other (See  Comments)      Regarding scans, the PATIENT IS CLAUSTROPHOBIC!!!  . Phenergan [Promethazine Hcl] Swelling and Other (See Comments)      Cannot be combined with Zofran because swelling of the eyes resulted and patient became very restless-  . Zofran [Ondansetron Hcl] Swelling and Other (See Comments)      Cannot be combined with Phenergan because swelling of the eyes resulted and patient became very restless-  . Sulfamethoxazole Rash and Itching         Medications Prior to Admission  Medication Sig Dispense Refill  . brimonidine (ALPHAGAN) 0.2 % ophthalmic solution Place 1 drop into both eyes 2 (two) times daily.      . cholecalciferol (VITAMIN D) 1000 units tablet Take 1,000 Units by mouth daily.       . diclofenac sodium (VOLTAREN) 1 % GEL Apply 2 g topically 3 (three) times daily as needed. (Patient taking differently: Apply 2 g topically 3 (three) times daily as needed (pain). ) 50 g 0  . diphenoxylate-atropine (LOMOTIL) 2.5-0.025 MG tablet Take 1 tablet by mouth every 6 (six) hours as needed for diarrhea or loose stools. 90 tablet 1  . ELIQUIS 5 MG TABS tablet TAKE 1 TABLET (5 MG TOTAL) BY MOUTH 2 (TWO) TIMES DAILY. (Patient taking differently: Take 2.5 mg by mouth 2 (two) times daily. ) 60 tablet 3  . furosemide (LASIX) 40 MG tablet Take 0.5 tablets (20 mg total) by mouth daily. (Patient taking differently: Take 40 mg by mouth daily. ) 45 tablet 3  . gabapentin (NEURONTIN) 100 MG capsule Take one at night for one week and then take one twice daily. (Patient taking differently: Take 100 mg by mouth 2 (two) times daily as needed (pain). ) 60 capsule 1  . HYDROcodone-acetaminophen (NORCO/VICODIN) 5-325 MG tablet Take 1 tablet by mouth every 8 (eight) hours as needed for moderate pain. for pain      . imatinib (GLEEVEC) 100 MG tablet Take 300 mg  by mouth daily with lunch. Take with noon meal and a large glass of water.Caution:Chemotherapy      . isosorbide mononitrate (IMDUR) 30 MG 24 hr tablet  Take 1 tablet (30 mg total) by mouth daily. 90 tablet 3  . lidocaine (LIDODERM) 5 % Place 1 patch onto the skin daily as needed (for back pain). Remove & Discard patch within 12 hours or as directed by MD       . loperamide (IMODIUM) 2 MG capsule Take 2 mg by mouth every 6 (six) hours as needed for diarrhea or loose stools.       . Multiple Vitamin (MULTIVITAMIN) tablet Take 1 tablet by mouth daily.      . multivitamin-lutein (OCUVITE-LUTEIN) CAPS capsule Take 1 capsule by mouth daily.      Marland Kitchen oxycodone (OXY-IR) 5 MG capsule Take 1 capsule (5 mg total) by mouth every 6 (six) hours as needed. (Patient taking differently: Take 5 mg by mouth every 6 (six) hours as needed (severe). ) 20 capsule 0  . oxyCODONE-acetaminophen (PERCOCET/ROXICET) 5-325 MG tablet Take 1 tablet by mouth every 4 (four) hours as needed for severe pain.      . predniSONE (DELTASONE) 1 MG tablet Two tablets daily for 2 months then take one tablet daily for 2 months, then stop (Patient taking differently: Take 1 mg by mouth daily with breakfast. ) 60 tablet 2  . pyridostigmine (MESTINON) 60 MG tablet Take 60 mg by mouth daily.      . rosuvastatin (CRESTOR) 10 MG tablet Take 5 mg by mouth daily.      . timolol (BETIMOL) 0.5 % ophthalmic solution Place 1 drop into both eyes 2 (two) times daily.         Drug Regimen Review Drug regimen was reviewed and remains appropriate with no significant issues identified   Home: Home Living Family/patient expects to be discharged to:: Private residence Living Arrangements: Spouse/significant other Available Help at Discharge: Family, Available 24 hours/day Type of Home: House Home Access: Ramped entrance Home Layout: Multi-level, Able to live on main level with bedroom/bathroom, Other (Comment)(stair lift) Bathroom Shower/Tub: Multimedia programmer: Handicapped height Bathroom Accessibility: Yes Home Equipment: [place] in, Environmental consultant - 2 wheels, [place] - single point,  Wheelchair - manual, Tub bench   Functional History: Prior Function Level of Independence: Needs assistance Gait / Transfers Assistance Needed: uses wheelchair for mobility, performs lateral scoot transfers, slideboard transfers onto toilet and shower bench  ADL's / [place] Needed: requires assist for all ADL's, self feeds   Functional Status:  Mobility: Bed Mobility Overal bed mobility: Needs Assistance Bed Mobility: Rolling, Sidelying to Sit Rolling: Min guard Sidelying to sit: Mod assist, HOB elevated General bed mobility comments: Rolling towards right to reach for rail, assist with trunk to get to EOB and to scoot forward. Transfers Overall transfer level: Needs assistance Equipment used: None Transfers: Lateral/Scoot Transfers  Lateral/Scoot Transfers: Min assist General transfer comment: Able to laterally scoot towards left side with cues for anterior weight shift; light MIn A needed to scoot bottom otherwise did pretty well. Did not need slide board today. Ambulation/Gait General Gait Details: unable     ADL: ADL Overall ADL's : Needs assistance/impaired Eating/Feeding: Minimal assistance, Sitting Eating/Feeding Details (indicate cue type and reason): reports dropping utensils freq Grooming: Minimal assistance, Sitting Upper Body Bathing: Moderate assistance, Sitting Lower Body Bathing: Maximal assistance, Sitting/lateral leans, Sit to/from stand, Adhering to back precautions Upper Body Dressing : Moderate assistance,  Sitting Lower Body Dressing: Maximal assistance, Adhering to back precautions, Sitting/lateral leans, Sit to/from stand Toilet Transfer: Minimal assistance, Transfer board, Requires drop arm, BSC Toileting- Clothing Manipulation and Hygiene: Minimal assistance, Sitting/lateral lean, Sit to/from stand Functional mobility during ADLs: Minimal assistance, +2 for safety/equipment(SPT with slide board) General ADL Comments: pt ltd 2/2 post  surgical precautions, pain, and baseline phys comorbidities   Cognition: Cognition Overall Cognitive Status: Within Functional Limits for tasks assessed Orientation Level: Oriented X4 Cognition Arousal/Alertness: Awake/alert Behavior During Therapy: WFL for tasks assessed/performed Overall Cognitive Status: Within Functional Limits for tasks assessed General Comments: Emotionally labile at times throughout session, but overall pleasant and appropriate   Physical Exam: Blood pressure (!) 144/52, pulse 89, temperature 98.1 F (36.7 C), temperature source Oral, resp. rate 18, height 5\' 6"  (1.676 m), weight 67.1 kg, SpO2 96 %. Physical Exam  Constitutional: He appears well-developed and well-nourished. No distress.  HENT:  Head: Atraumatic.  Eyes: EOM are normal.  Neck: Normal range of motion.  Soft cervical collar in place surgical site clean and dry  Cardiovascular: Normal rate, regular rhythm and normal heart sounds. Exam reveals no friction rub.  No murmur heard. Respiratory: Effort normal. No stridor. No respiratory distress. He has no wheezes. He has no rales.  GI: Soft. He exhibits no distension. There is no abdominal tenderness.  Genitourinary:    Genitourinary Comments: Foley in place, urine yellow, sl cloudy   Musculoskeletal: Normal range of motion.        General: No deformity or edema.  Neurological:  Alert and oriented x 3. Reasonable insight and awareness. Normal CN findings. UE4-/5 deltoid, biceps, triceps, 3/5 wrist, hand. RLE: 4/5 HF, LLE: 4-/5 HF, KE and 4/5 ADF/PF. Decreased LT and pain in bilateral hand below distal forearm. DTR's 1+     Skin: No rash noted. He is not diaphoretic. No erythema.  Surgical incision cdi. Right AK stump well healed.   Psychiatric: He has a normal mood and affect. His behavior is normal.      Lab Results Last 48 Hours       Results for orders placed or performed during the hospital encounter of 11/09/18 (from the past 48 hour(s))   Glucose, capillary     Status: Abnormal    Collection Time: 11/09/18  2:12 PM  Result Value Ref Range    Glucose-Capillary 122 (H) 70 - 99 mg/dL    Comment 1 Notify RN      Comment 2 Document in Chart    Glucose, capillary     Status: Abnormal    Collection Time: 11/09/18  8:27 PM  Result Value Ref Range    Glucose-Capillary 155 (H) 70 - 99 mg/dL    *Note: Due to a large number of results and/or encounters for the requested time period, some results have not been displayed. A complete set of results can be found in Results Review.      Imaging Results (Last 48 hours)  Dg Cervical Spine 2-3 Views   Result Date: 11/09/2018 CLINICAL DATA:  Anterior surgical fusion of C3-4 with C5 corpectomy. EXAM: CERVICAL SPINE - 2-3 VIEW; DG C-ARM 1-60 MIN FLUOROSCOPY TIME:  24 seconds. COMPARISON:  CT scan of October 30, 2017. FINDINGS: Three intraoperative fluoroscopic images were obtained of the cervical spine. Patient is status post surgical anterior fusion extending from C3 to C6, with C5 corpectomy. Surgical screws are noted at C3, C4 and C6. IMPRESSION: Fluoroscopic guidance provided during anterior cervical fusion as described above. Electronically  Signed   By: Marijo Conception M.D.   On: 11/09/2018 13:38    Dg C-arm 1-60 Min   Result Date: 11/09/2018 CLINICAL DATA:  Anterior surgical fusion of C3-4 with C5 corpectomy. EXAM: CERVICAL SPINE - 2-3 VIEW; DG C-ARM 1-60 MIN FLUOROSCOPY TIME:  24 seconds. COMPARISON:  CT scan of October 30, 2017. FINDINGS: Three intraoperative fluoroscopic images were obtained of the cervical spine. Patient is status post surgical anterior fusion extending from C3 to C6, with C5 corpectomy. Surgical screws are noted at C3, C4 and C6. IMPRESSION: Fluoroscopic guidance provided during anterior cervical fusion as described above. Electronically Signed   By: Marijo Conception M.D.   On: 11/09/2018 13:38            Medical Problem List and Plan: 1.  Decreased functional  ability with upper extremity weakness secondary to cervical stenosis/HNP/severe myelopathy.  Status post C3-4 anterior cervical corpectomy and Anterior cervical arthrodesis C3-4 4-5 and 5-6 11/09/2018.   -admit to inpatient rehab             -ELOS 10-14 days.  Goals supervision             -Soft collar as directed             -pt had just begun learning to walk with right AK prosthesis prior to experiencing issues with his neck. Advised him to leave leg at home as it won't aid his ability to transfer. Prosthetic gait training will be something to address once he regains strength after his cervical surgery 2.  Antithrombotics: -DVT/anticoagulation: Chronic Eliquis on hold x7 days from day of surgery and then resume             -antiplatelet therapy: N/A 3. Pain Management: Oxycodone and Robaxin as needed 4. Mood: Provide emotional support             -antipsychotic agents: N/A 5. Neuropsych: This patient is capable of making decisions on his own behalf. 6. Skin/Wound Care: Routine skin checks 7. Fluids/Electrolytes/Nutrition: Routine in and outs with follow-up chemistries 8.  Acute on chronic anemia.  Follow-up CBC 9.  Chronic myeloid leukemia.  Follow-up outpatient 10.  CKD stage III.  Creatinine baseline 1.63-1.79 11.  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 12.  Right AKA 2019.  Patient did receive inpatient rehab services 13.  Myasthenia gravis.  Continue Mestinon and low-dose chronic prednisone 14.  Hypertension.  Lasix 40 mg daily, Imdur 30 mg daily.              -follow for pattern             -adjust regimen as needed 15. Dysuria/odor/foley/?neurogenic bladder             -remove foley             -voiding trial             -UA equivocal, UCX not sent---send now 16. Neurogenic bowel:             -pt having and sensing flatus             -augment bowel regimen and observe       Cathlyn Parsons, PA-C 11/11/2018  I have personally performed a face to face diagnostic evaluation  of this patient and formulated the key components of the plan.  Additionally, I have personally reviewed laboratory data, imaging studies, as well as relevant notes and concur with the physician assistant's  documentation above.  The patient's status has not changed from the original H&P.  Any changes in documentation from the acute care chart have been noted above.  Meredith Staggers, MD, [facility]

## 2018-11-11 NOTE — H&P (Signed)
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  Per chart review patient lives with spouse.  Multilevel level home with ramped entrance in bed and bath on main level.  Patient primarily uses a wheelchair for mobility performed lateral scoot transfers and sliding board transfers onto toilet and shower bench.  He had been using his prosthesis but no longer could because of upper extremity weakness.  Wife did assist with some ADLs.  Presented 11/09/2018 with complaints of neck pain, numbness bilateral upper extremities and weakness that is progressed over the last several months.  X-rays and imaging revealed herniated nucleus pulposus with spondylosis/stenosis at C3- 4 C-4-6 with severe myelopathy.  Underwent decompressive anterior cervical discectomy C3-4 with anterior cervical corpectomy anterior cervical arthrodesis C3-4, 4-5 and C5-6 11/09/2018 per Dr. Sherley Bounds.  Hospital course pain management.  Soft cervical collar as directed.  Acute blood loss anemia 9.9.  Decadron protocol as indicated.  Chronic Eliquis to be held 7 days from day of surgery and then could be resumed..  Tolerating a regular diet.  Therapy evaluation completed and patient was admitted for a comprehensive rehab program.  Review of Systems  Constitutional: Negative for chills and fever.  HENT: Negative for hearing loss.   Eyes: Positive for blurred vision. Negative for double vision.  Respiratory: Negative for cough and shortness of breath.   Cardiovascular: Positive for leg swelling. Negative for chest pain and palpitations.   Gastrointestinal: Positive for constipation. Negative for heartburn, nausea and vomiting.       GERD  Genitourinary: Negative for dysuria and hematuria.  Musculoskeletal: Positive for back pain, joint pain and myalgias.  Skin: Negative for rash.  Psychiatric/Behavioral: The patient has insomnia.   All other systems reviewed and are negative.  Past Medical History:  Diagnosis Date  . Bruises easily    d/t being on Eliquis  . Carotid artery disease ([place])    a. Duplex 05/2014: 123456 RICA, 123456 LICA, elevated velocities in right subclavian artery, normal left subclavian artery..  . Chronic back pain    HNP  . Chronic kidney disease (CKD)   . CKD (chronic kidney disease)   . Claustrophobia    takes Ativan as needed  . CML (chronic myeloid leukemia) ([place])    takes Gleevec daily  . Coronary atherosclerosis of unspecified type of vessel, native or graft    a. cath in 2003 showed 10-20% LM, scattered 20% prox-mid LAD, 30% more distal LAD, 50-60% stenosis of LAD towards apex, 20% Cx, 30% dRCA, focal 95% stenosis in smaller of 2 branches of PDA, EF 60-65%.  . Diabetes mellitus ([place])    no longer- cancxelled by Dr Hale Bogus  . Diarrhea    takes Imodium daily as needed  . Diverticulosis   . Diverticulosis of colon (without mention of hemorrhage)   . DJD (degenerative joint disease)   . DVT (deep venous thrombosis) ([place]) 07/2011  . Esophageal stricture   . Essential hypertension    takes Imdur and Lisinopril daily  . Gastric polyps    benign  . GERD (gastroesophageal reflux disease)    takes Nexium daily  . Glaucoma  uses eye drops  . History of blood transfusion `   leg amputation  . History of bronchitis    not sure when the last time  . History of colon polyps    benign  . History of kidney stones    litrotrispspy  . History of kidney stones    passed  . Insomnia    takes Melatonin nightly as needed  . Mixed hyperlipidemia    takes Crestor daily  . Myasthenia gravis  ([place])    uses prednisone  . Osteoporosis 2016  . Peripheral edema    takes Lasix daily  . Peripheral vascular disease ([place])   . Pituitary tumor    checks Dr[facility]Walden at [facility] checks it every Jan   . Pneumonia 01/2016  . PONV (postoperative nausea and vomiting)   . Ptosis   . Pulmonary embolus ([place]) 2012  . Stroke ([place]) 06/2015   x 3- last one 2018- balance issuses   Past Surgical History:  Procedure Laterality Date  . ABDOMINAL AORTOGRAM W/LOWER EXTREMITY N/A 11/18/2016   Procedure: ABDOMINAL AORTOGRAM W/LOWER EXTREMITY;  Surgeon: [name], MD;  Location: [place] [facility];  Service: Cardiovascular;  Laterality: N/A;  . AMPUTATION Right 02/07/2018   Procedure: right leg AMPUTATION ABOVE KNEE;  Surgeon: [name], MD;  Location: [place];  Service: Vascular;  Laterality: Right;  . ANTERIOR CERVICAL DECOMP/DISCECTOMY FUSION N/A 11/09/2018   Procedure: CERVICAL THREE-FOUR ANTERIOR CERVICAL DECOMPRESSION/DISCECTOMY FUSION WITH CERVICAL FIVE CORPECTOMY;  Surgeon: [name], MD;  Location: [place];  Service: Neurosurgery;  Laterality: N/A;  . AORTOGRAM Right 12/23/2017   Procedure: AORTOGRAM, RIGHT LOWER EXTREMITY ANGIOGRAM, RIGHT SUPERFICIAL FEMORAL ARTERY ANGIOPLASTY WITH STENT, RIGHT POPLITEAL ARTERY ANGIOPLASTY WITH STENT, LEFT GROIN CUTDOWN AND LEFT FEMORAL ARTERY REPAIR;  Surgeon: [name], MD;  Location: [place];  Service: Vascular;  Laterality: Right;  . BACK SURGERY     x2, lumbar and cervical  . CARDIAC CATHETERIZATION    . CARPAL TUNNEL RELEASE Left 09/23/2018   Procedure: CARPAL TUNNEL RELEASE;  Surgeon: [name], MD;  Location: [place];  Service: Neurosurgery;  Laterality: Left;  CARPAL TUNNEL RELEASE  . CATARACT EXTRACTION Bilateral 12/12  . COLONOSCOPY    . ESOPHAGOGASTRODUODENOSCOPY ENDOSCOPY  04/2015  . IR GENERIC HISTORICAL  03/03/2016   IR IVC FILTER PLMT / S&I [name] GUID/MOD SED 03/03/2016 [name], MD [facility]  . IR GENERIC  HISTORICAL  04/09/2016   IR RADIOLOGIST EVAL & MGMT 04/09/2016 [name], MD [facility]  . IR GENERIC HISTORICAL  04/30/2016   IR IVC FILTER RETRIEVAL / S&I [name] GUID/MOD SED 04/30/2016 [name], MD [facility]-INTER[facility] RAD  . LITHOTRIPSY    . LUMBAR LAMINECTOMY/DECOMPRESSION MICRODISCECTOMY Right 09/14/2012   Procedure: Right Lumbar three-four, four-five, Lumbar five-Sacral one decompressive laminectomy;  Surgeon: [name], MD;  Location: [place] [facility];  Service: Neurosurgery;  Laterality: Right;  . LUMBAR LAMINECTOMY/DECOMPRESSION MICRODISCECTOMY Left 03/11/2016   Procedure: Left Lumbar Two-ThreeMicrodiscectomy;  Surgeon: [name], MD;  Location: [place];  Service: Neurosurgery;  Laterality: Left;  . PERIPHERAL VASCULAR INTERVENTION  11/18/2016   Procedure: PERIPHERAL VASCULAR INTERVENTION;  Surgeon: [name], MD;  Location: [place] [facility];  Service: Cardiovascular;;  Left popliteal  . SHOULDER SURGERY Left 05/07/2009  . TONSILLECTOMY    . ULNAR NERVE TRANSPOSITION Left 09/23/2018   Procedure: ULNAR NERVE DECOMPRESSION/TRANSPOSITION;  Surgeon: [name], MD;  Location: [place];  Service: Neurosurgery;  Laterality: Left;  ULNAR NERVE DECOMPRESSION/TRANSPOSITION   Family History  Problem Relation Age of Onset  . Heart disease Mother   . Heart attack Mother   . Stroke Mother   . Heart disease Father   . Heart attack Father   . Stroke Father   . Breast cancer Sister        Twin   . Heart attack Sister   . Dementia Sister   . Heart disease Sister   . Heart attack Sister   . Clotting disorder Sister   . Heart attack Sister   . Colon cancer Neg Hx    Social History:  reports that he has never smoked. He has never used smokeless tobacco. He reports that he does not drink alcohol or use drugs. Allergies:  Allergies  Allergen Reactions  . Other Other (See Comments)    Regarding scans, the PATIENT IS CLAUSTROPHOBIC!!!  . Phenergan [Promethazine Hcl] Swelling and Other  (See Comments)    Cannot be combined with Zofran because swelling of the eyes resulted and patient became very restless-  . Zofran [Ondansetron Hcl] Swelling and Other (See Comments)    Cannot be combined with Phenergan because swelling of the eyes resulted and patient became very restless-  . Sulfamethoxazole Rash and Itching   Medications Prior to Admission  Medication Sig Dispense Refill  . brimonidine (ALPHAGAN) 0.2 % ophthalmic solution Place 1 drop into both eyes 2 (two) times daily.    . cholecalciferol (VITAMIN D) 1000 units tablet Take 1,000 Units by mouth daily.     . diclofenac sodium (VOLTAREN) 1 % GEL Apply 2 g topically 3 (three) times daily as needed. (Patient taking differently: Apply 2 g topically 3 (three) times daily as needed (pain). ) 50 g 0  . diphenoxylate-atropine (LOMOTIL) 2.5-0.025 MG tablet Take 1 tablet by mouth every 6 (six) hours as needed for diarrhea or loose stools. 90 tablet 1  . ELIQUIS 5 MG TABS tablet TAKE 1 TABLET (5 MG TOTAL) BY MOUTH 2 (TWO) TIMES DAILY. (Patient taking differently: Take 2.5 mg by mouth 2 (two) times daily. ) 60 tablet 3  . furosemide (LASIX) 40 MG tablet Take 0.5 tablets (20 mg total) by mouth daily. (Patient taking differently: Take 40 mg by mouth daily. ) 45 tablet 3  . gabapentin (NEURONTIN) 100 MG capsule Take one at night for one week and then take one twice daily. (Patient taking differently: Take 100 mg by mouth 2 (two) times daily as needed (pain). ) 60 capsule 1  . HYDROcodone-acetaminophen (NORCO/VICODIN) 5-325 MG tablet Take 1 tablet by mouth every 8 (eight) hours as needed for moderate pain. for pain    . imatinib (GLEEVEC) 100 MG tablet Take 300 mg by mouth daily with lunch. Take with noon meal and a large glass of water.Caution:Chemotherapy    . isosorbide mononitrate (IMDUR) 30 MG 24 hr tablet Take 1 tablet (30 mg total) by mouth daily. 90 tablet 3  . lidocaine (LIDODERM) 5 % Place 1 patch onto the skin daily as needed (for  back pain). Remove & Discard patch within 12 hours or as directed by MD     . loperamide (IMODIUM) 2 MG capsule Take 2 mg by mouth every 6 (six) hours as needed for diarrhea or loose stools.     . Multiple Vitamin (MULTIVITAMIN) tablet Take 1 tablet by mouth daily.    . multivitamin-lutein (OCUVITE-LUTEIN) CAPS capsule Take 1 capsule by mouth daily.    Marland Kitchen oxycodone (OXY-IR) 5 MG capsule Take  1 capsule (5 mg total) by mouth every 6 (six) hours as needed. (Patient taking differently: Take 5 mg by mouth every 6 (six) hours as needed (severe). ) 20 capsule 0  . oxyCODONE-acetaminophen (PERCOCET/ROXICET) 5-325 MG tablet Take 1 tablet by mouth every 4 (four) hours as needed for severe pain.    . predniSONE (DELTASONE) 1 MG tablet Two tablets daily for 2 months then take one tablet daily for 2 months, then stop (Patient taking differently: Take 1 mg by mouth daily with breakfast. ) 60 tablet 2  . pyridostigmine (MESTINON) 60 MG tablet Take 60 mg by mouth daily.    . rosuvastatin (CRESTOR) 10 MG tablet Take 5 mg by mouth daily.    . timolol (BETIMOL) 0.5 % ophthalmic solution Place 1 drop into both eyes 2 (two) times daily.      Drug Regimen Review Drug regimen was reviewed and remains appropriate with no significant issues identified  Home: Home Living Family/patient expects to be discharged to:: Private residence Living Arrangements: Spouse/significant other Available Help at Discharge: Family, Available 24 hours/day Type of Home: House Home Access: Ramped entrance Home Layout: Multi-level, Able to live on main level with bedroom/bathroom, Other (Comment)(stair lift) Bathroom Shower/Tub: Multimedia programmer: Handicapped height Bathroom Accessibility: Yes Home Equipment: [place] in, Environmental consultant - 2 wheels, [place] - single point, Wheelchair - manual, Tub bench   Functional History: Prior Function Level of Independence: Needs assistance Gait / Transfers Assistance Needed: uses  wheelchair for mobility, performs lateral scoot transfers, slideboard transfers onto toilet and shower bench  ADL's / [place] Needed: requires assist for all ADL's, self feeds  Functional Status:  Mobility: Bed Mobility Overal bed mobility: Needs Assistance Bed Mobility: Rolling, Sidelying to Sit Rolling: Min guard Sidelying to sit: Mod assist, HOB elevated General bed mobility comments: Rolling towards right to reach for rail, assist with trunk to get to EOB and to scoot forward. Transfers Overall transfer level: Needs assistance Equipment used: None Transfers: Lateral/Scoot Transfers  Lateral/Scoot Transfers: Min assist General transfer comment: Able to laterally scoot towards left side with cues for anterior weight shift; light MIn A needed to scoot bottom otherwise did pretty well. Did not need slide board today. Ambulation/Gait General Gait Details: unable    ADL: ADL Overall ADL's : Needs assistance/impaired Eating/Feeding: Minimal assistance, Sitting Eating/Feeding Details (indicate cue type and reason): reports dropping utensils freq Grooming: Minimal assistance, Sitting Upper Body Bathing: Moderate assistance, Sitting Lower Body Bathing: Maximal assistance, Sitting/lateral leans, Sit to/from stand, Adhering to back precautions Upper Body Dressing : Moderate assistance, Sitting Lower Body Dressing: Maximal assistance, Adhering to back precautions, Sitting/lateral leans, Sit to/from stand Toilet Transfer: Minimal assistance, Transfer board, Requires drop arm, BSC Toileting- Clothing Manipulation and Hygiene: Minimal assistance, Sitting/lateral lean, Sit to/from stand Functional mobility during ADLs: Minimal assistance, +2 for safety/equipment(SPT with slide board) General ADL Comments: pt ltd 2/2 post surgical precautions, pain, and baseline phys comorbidities  Cognition: Cognition Overall Cognitive Status: Within Functional Limits for tasks assessed  Orientation Level: Oriented X4 Cognition Arousal/Alertness: Awake/alert Behavior During Therapy: WFL for tasks assessed/performed Overall Cognitive Status: Within Functional Limits for tasks assessed General Comments: Emotionally labile at times throughout session, but overall pleasant and appropriate  Physical Exam: Blood pressure (!) 144/52, pulse 89, temperature 98.1 F (36.7 C), temperature source Oral, resp. rate 18, height 5\' 6"  (1.676 m), weight 67.1 kg, SpO2 96 %. Physical Exam  Constitutional: He appears well-developed and well-nourished. No distress.  HENT:  Head: Atraumatic.  Eyes: EOM are normal.  Neck: Normal range of motion.  Soft cervical collar in place surgical site clean and dry  Cardiovascular: Normal rate, regular rhythm and normal heart sounds. Exam reveals no friction rub.  No murmur heard. Respiratory: Effort normal. No stridor. No respiratory distress. He has no wheezes. He has no rales.  GI: Soft. He exhibits no distension. There is no abdominal tenderness.  Genitourinary:    Genitourinary Comments: Foley in place, urine yellow, sl cloudy   Musculoskeletal: Normal range of motion.        General: No deformity or edema.  Neurological:  Alert and oriented x 3. Reasonable insight and awareness. Normal CN findings. UE4-/5 deltoid, biceps, triceps, 3/5 wrist, hand. RLE: 4/5 HF, LLE: 4-/5 HF, KE and 4/5 ADF/PF. Decreased LT and pain in bilateral hand below distal forearm. DTR's 1+     Skin: No rash noted. He is not diaphoretic. No erythema.  Surgical incision cdi. Right AK stump well healed.   Psychiatric: He has a normal mood and affect. His behavior is normal.    Results for orders placed or performed during the hospital encounter of 11/09/18 (from the past 48 hour(s))  Glucose, capillary     Status: Abnormal   Collection Time: 11/09/18  2:12 PM  Result Value Ref Range   Glucose-Capillary 122 (H) 70 - 99 mg/dL   Comment 1 Notify RN    Comment 2 Document  in Chart   Glucose, capillary     Status: Abnormal   Collection Time: 11/09/18  8:27 PM  Result Value Ref Range   Glucose-Capillary 155 (H) 70 - 99 mg/dL   *Note: Due to a large number of results and/or encounters for the requested time period, some results have not been displayed. A complete set of results can be found in Results Review.   Dg Cervical Spine 2-3 Views  Result Date: 11/09/2018 CLINICAL DATA:  Anterior surgical fusion of C3-4 with C5 corpectomy. EXAM: CERVICAL SPINE - 2-3 VIEW; DG C-ARM 1-60 MIN FLUOROSCOPY TIME:  24 seconds. COMPARISON:  CT scan of October 30, 2017. FINDINGS: Three intraoperative fluoroscopic images were obtained of the cervical spine. Patient is status post surgical anterior fusion extending from C3 to C6, with C5 corpectomy. Surgical screws are noted at C3, C4 and C6. IMPRESSION: Fluoroscopic guidance provided during anterior cervical fusion as described above. Electronically Signed   By: Marijo Conception M.D.   On: 11/09/2018 13:38   Dg C-arm 1-60 Min  Result Date: 11/09/2018 CLINICAL DATA:  Anterior surgical fusion of C3-4 with C5 corpectomy. EXAM: CERVICAL SPINE - 2-3 VIEW; DG C-ARM 1-60 MIN FLUOROSCOPY TIME:  24 seconds. COMPARISON:  CT scan of October 30, 2017. FINDINGS: Three intraoperative fluoroscopic images were obtained of the cervical spine. Patient is status post surgical anterior fusion extending from C3 to C6, with C5 corpectomy. Surgical screws are noted at C3, C4 and C6. IMPRESSION: Fluoroscopic guidance provided during anterior cervical fusion as described above. Electronically Signed   By: Marijo Conception M.D.   On: 11/09/2018 13:38       Medical Problem List and Plan: 1.  Decreased functional ability with upper extremity weakness secondary to cervical stenosis/HNP/severe myelopathy.  Status post C3-4 anterior cervical corpectomy and Anterior cervical arthrodesis C3-4 4-5 and 5-6 11/09/2018.   -admit to inpatient rehab  -ELOS 10-14 days.   Goals supervision  -Soft collar as directed  -pt had just begun learning to walk with right AK prosthesis  prior to experiencing issues with his neck. Advised him to leave leg at home as it won't aid his ability to transfer. Prosthetic gait training will be something to address once he regains strength after his cervical surgery 2.  Antithrombotics: -DVT/anticoagulation: Chronic Eliquis on hold x7 days from day of surgery and then resume  -antiplatelet therapy: N/A 3. Pain Management: Oxycodone and Robaxin as needed 4. Mood: Provide emotional support  -antipsychotic agents: N/A 5. Neuropsych: This patient is capable of making decisions on his own behalf. 6. Skin/Wound Care: Routine skin checks 7. Fluids/Electrolytes/Nutrition: Routine in and outs with follow-up chemistries 8.  Acute on chronic anemia.  Follow-up CBC 9.  Chronic myeloid leukemia.  Follow-up outpatient 10.  CKD stage III.  Creatinine baseline 1.63-1.79 11.  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 12.  Right AKA 2019.  Patient did receive inpatient rehab services 13.  Myasthenia gravis.  Continue Mestinon and low-dose chronic prednisone 14.  Hypertension.  Lasix 40 mg daily, Imdur 30 mg daily.   -follow for pattern  -adjust regimen as needed 15. Dysuria/odor/foley/?neurogenic bladder  -remove foley  -voiding trial  -check urine for UA/UCX 16. Neurogenic bowel:  -pt having and sensing flatus  -augment bowel regimen and observe     Cathlyn Parsons, PA-C 11/11/2018

## 2018-11-11 NOTE — TOC Transition Note (Signed)
Transition of Care [facility]) - CM/SW Discharge Note Marvetta Gibbons RN,BSN Transitions of Care Unit 4NP - RN Case Manager [phone]   Patient Details  Name: [name] MRN: [id number] Date of Birth: [birth year]  Transition of [facility]) CM/SW Contact:  Dawayne Patricia, RN Phone Number: 11/11/2018, 2:51 PM   Clinical Narrative:    Pt stable for transition to CIR to day, [facility] with INPT rehab has confirmed bed available today and pt has been cleared by MD. Plan to tx to CIR this afternoon.    Final next level of care: IP Rehab Facility Barriers to Discharge: No Barriers Identified   Patient Goals and CMS Choice Patient states their goals for this hospitalization and ongoing recovery are:: rehab      Discharge Placement           [facility] IP rehab            Discharge Plan and Services     Post Acute Care Choice: IP Rehab          DME Arranged: N/A DME Agency: NA       HH Arranged: NA HH Agency: NA        Social Determinants of Health (SDOH) Interventions     Readmission Risk Interventions Readmission Risk Prevention Plan 11/11/2018  PCP or Specialist Appt within 3-5 Days (No Data)  [place] or [place] Complete  Social Work Consult for [place] Planning/Counseling Complete  Palliative Care Screening Not Applicable  Medication Review Press photographer) Complete  Some recent data might be hidden

## 2018-11-11 NOTE — Progress Notes (Signed)
Physical Therapy Treatment Patient Details Name: [name] MRN: [id number] DOB: Jul 23, 1940 Today's Date: 11/11/2018    History of Present Illness Pt is a 78 y.o. M with significant PMH of CAD, Diabetes Mellitus Type 2, DVT, PE, stroke, right AKA 2019, back surgery who presents s/p C3-4 ACDF and C5 corpectomy.    PT Comments    Patient progressing well towards PT goals. Tolerated lateral scoot transfer to chair without slide board today and Min A. Requires assist with bed mobility due to weakness in BUEs and core. Tolerated there ex of LLE and LUE. Instructed pt in HEP and pressure relief techniques while sitting in chair. Pt relieved to be in soft collar. Does report some difficulty with swallowing since surgery. MD/RN aware. Highly motivated to get to rehab so he can go home.     CIR;Supervision/Assistance - 24 hour     Equipment Recommendations  None recommended by PT    Recommendations for Other Services       Precautions / Restrictions Precautions Precautions: Fall;Cervical;Other (comment) Precaution Booklet Issued: Yes (comment) Precaution Comments: Rt AKA Required Braces or Orthoses: Cervical Brace Cervical Brace: Soft collar Restrictions Weight Bearing Restrictions: No    Mobility  Bed Mobility Overal bed mobility: Needs Assistance Bed Mobility: Rolling;Sidelying to Sit Rolling: Min guard Sidelying to sit: Mod assist;HOB elevated       General bed mobility comments: Rolling towards right to reach for rail, assist with trunk to get to EOB and to scoot forward.  Transfers Overall transfer level: Needs assistance Equipment used: None Transfers: Lateral/Scoot Transfers          Lateral/Scoot Transfers: Min assist General transfer comment: Able to laterally scoot towards left side with cues for anterior weight shift; light MIn A needed to scoot bottom otherwise did pretty well. Did not need slide board today.  Ambulation/Gait             General Gait  Details: unable   Stairs             Wheelchair Mobility    Modified Rankin (Stroke Patients Only)       Balance Overall balance assessment: Needs assistance Sitting-balance support: (foot supported) Sitting balance-Leahy Scale: Fair Sitting balance - Comments: Supervision for sitting balance                                    Cognition Arousal/Alertness: Awake/alert Behavior During Therapy: WFL for tasks assessed/performed Overall Cognitive Status: Within Functional Limits for tasks assessed                                        Exercises General Exercises - Lower Extremity Ankle Circles/Pumps: Left;20 reps;Supine Quad Sets: Left;10 reps;Supine Long Arc Quad: Left;20 reps;Seated Heel Slides: Left;10 reps;Supine Other Exercises Other Exercises: Chair push ups x10 Other Exercises: Ball squeezes x20 LUE Other Exercises: encouraged use of using LUE for fine motor tasks- pushing buttons on remote etc Other Exercises: AROM of Left forearm/wrist/digits    General Comments        Pertinent Vitals/Pain Pain Assessment: Faces Faces Pain Scale: Hurts even more Pain Location: left side of groin (pt states this is chronic) Pain Descriptors / Indicators: Constant;Guarding;Grimacing Pain Intervention(s): Repositioned;Monitored during session    Home Living  Prior Function            PT Goals (current goals can now be found in the care plan section) Progress towards PT goals: Progressing toward goals    Frequency    Min 5X/week      PT Plan Current plan remains appropriate    Co-evaluation              AM-PAC PT "6 Clicks" Mobility   Outcome Measure  Help needed turning from your back to your side while in a flat bed without using bedrails?: A Little Help needed moving from lying on your back to sitting on the side of a flat bed without using bedrails?: A Lot Help needed moving to and  from a bed to a chair (including a wheelchair)?: A Little Help needed standing up from a chair using your arms (e.g., wheelchair or bedside chair)?: Total Help needed to walk in hospital room?: Total Help needed climbing 3-5 steps with a railing? : Total 6 Click Score: 11    End of Session Equipment Utilized During Treatment: Gait belt Activity Tolerance: Patient tolerated treatment well Patient left: in chair;with call bell/phone within reach;with nursing/sitter in room Nurse Communication: Mobility status PT Visit Diagnosis: Other abnormalities of gait and mobility (R26.89);Muscle weakness (generalized) (M62.81);Pain Pain - Right/Left: Left Pain - part of body: Leg     Time: [id number] PT Time Calculation (min) (ACUTE ONLY): 23 min  Charges:  $Therapeutic Exercise: 8-22 mins $Therapeutic Activity: 8-22 mins                     Wray Kearns, [place], DPT Acute Rehabilitation Services Pager [phone] Office [phone]       [name] 11/11/2018, 9:31 AM

## 2018-11-11 NOTE — Progress Notes (Signed)
Inpatient Rehab Admissions:  Inpatient Rehab Consult received.  I met with patient and his wife at the bedside for rehabilitation assessment and to discuss goals and expectations of an inpatient rehab admission.  Both are familiar with CIR from previous admissions and hopeful for return.  I have approval from Dr. Ronnald Ramp and a bed available for pt to admit to CIR today. I will let pt/family, RN, and CM know.   Signed: Shann Medal, PT, DPT Admissions Coordinator [phone] 11/11/18  10:28 AM

## 2018-11-11 NOTE — Progress Notes (Signed)
Patient ID: [name], male   DOB: 07-16-1940, 78 y.o.   MRN: [id number] Seems ok, hands stable, less swelling, pain controlled, hates the collar, has foley, CIR consult, drain removed

## 2018-11-11 NOTE — Discharge Summary (Signed)
Physician Discharge Summary  Patient ID: [name] MRN: [id number] DOB/AGE: 03/11/1940 78 y.o.  Admit date: 11/09/2018 Discharge date: 11/11/2018  Admission Diagnoses: Cervical spondylotic myelopathy    Discharge Diagnoses: Same   Discharged Condition: stable  Hospital Course: The patient was admitted on 11/09/2018 and taken to the operating room where the patient underwent C3-4 ACDF and C5 corpectomy. The patient tolerated the procedure well and was taken to the recovery room and then to the floor in stable condition. The hospital course was routine. There were no complications. The wound remained clean dry and intact. Pt had appropriate neck soreness. No complaints of arm pain or new N/T/W. The patient remained afebrile with stable vital signs, and tolerated a regular diet. The patient continued to increase activities, and pain was well controlled with oral pain medications.  He is quite debilitated by his myelopathy and by his right AKA and he was felt appropriate for discharge to rehab  Consults: rehabilitation medicine  Significant Diagnostic Studies:  Results for orders placed or performed during the hospital encounter of 11/09/18  Glucose, capillary  Result Value Ref Range   Glucose-Capillary 112 (H) 70 - 99 mg/dL   Comment 1 Notify RN    Comment 2 Document in Chart   Glucose, capillary  Result Value Ref Range   Glucose-Capillary 122 (H) 70 - 99 mg/dL   Comment 1 Notify RN    Comment 2 Document in Chart   Glucose, capillary  Result Value Ref Range   Glucose-Capillary 155 (H) 70 - 99 mg/dL  Urinalysis, Routine w reflex microscopic  Result Value Ref Range   Color, Urine YELLOW YELLOW   APPearance CLEAR CLEAR   Specific Gravity, Urine 1.013 1.005 - 1.030   pH 6.0 5.0 - 8.0   Glucose, UA 50 (A) NEGATIVE mg/dL   Hgb urine dipstick NEGATIVE NEGATIVE   Bilirubin Urine NEGATIVE NEGATIVE   Ketones, ur NEGATIVE NEGATIVE mg/dL   Protein, ur NEGATIVE NEGATIVE mg/dL    Nitrite NEGATIVE NEGATIVE   Leukocytes,Ua SMALL (A) NEGATIVE   RBC / HPF 0-5 0 - 5 RBC/hpf   WBC, UA 0-5 0 - 5 WBC/hpf   Bacteria, UA FEW (A) NONE SEEN  Type and screen [place]  Result Value Ref Range   ABO/RH(D) A POS    Antibody Screen NEG    Sample Expiration      11/12/2018,2359 Performed at [facility], [address], [place], [place] [address]    *Note: Due to a large number of results and/or encounters for the requested time period, some results have not been displayed. A complete set of results can be found in Results Review.    Chest 2 View  Result Date: 11/07/2018 CLINICAL DATA:  Pre op-corpectomy C5, ACDF C3-4 EXAM: CHEST - 2 VIEW COMPARISON:  Chest radiograph 06/27/2017 FINDINGS: Stable cardiomediastinal contours with mildly enlarged heart size. Aortic arch calcification. Stable mildly elevated right hemidiaphragm. The lungs are clear. No pneumothorax or pleural effusion. Degenerative changes are seen in the thoracic spine. The IMPRESSION: No evidence of active disease. Electronically Signed   By: Audie Pinto M.D.   On: 11/07/2018 20:38   Dg Cervical Spine 2-3 Views  Result Date: 11/09/2018 CLINICAL DATA:  Anterior surgical fusion of C3-4 with C5 corpectomy. EXAM: CERVICAL SPINE - 2-3 VIEW; DG C-ARM 1-60 MIN FLUOROSCOPY TIME:  24 seconds. COMPARISON:  CT scan of October 30, 2017. FINDINGS: Three intraoperative fluoroscopic images were obtained of the cervical spine. Patient  is status post surgical anterior fusion extending from C3 to C6, with C5 corpectomy. Surgical screws are noted at C3, C4 and C6. IMPRESSION: Fluoroscopic guidance provided during anterior cervical fusion as described above. Electronically Signed   By: Marijo Conception M.D.   On: 11/09/2018 13:38   Dg C-arm 1-60 Min  Result Date: 11/09/2018 CLINICAL DATA:  Anterior surgical fusion of C3-4 with C5 corpectomy. EXAM: CERVICAL SPINE - 2-3 VIEW; DG C-ARM 1-60 MIN FLUOROSCOPY  TIME:  24 seconds. COMPARISON:  CT scan of October 30, 2017. FINDINGS: Three intraoperative fluoroscopic images were obtained of the cervical spine. Patient is status post surgical anterior fusion extending from C3 to C6, with C5 corpectomy. Surgical screws are noted at C3, C4 and C6. IMPRESSION: Fluoroscopic guidance provided during anterior cervical fusion as described above. Electronically Signed   By: Marijo Conception M.D.   On: 11/09/2018 13:38    Antibiotics:  Anti-infectives (From admission, onward)   Start     Dose/Rate Route Frequency Ordered Stop   11/09/18 1850  ceFAZolin (ANCEF) 2-4 GM/100ML-% IVPB    Note to Pharmacy: Dorise Hiss   : cabinet override      11/09/18 1850 11/09/18 2048   11/09/18 1845  ceFAZolin (ANCEF) IVPB 2g/100 mL premix     2 g 200 mL/hr over 30 Minutes Intravenous Every 8 hours 11/09/18 1839 11/10/18 0259   11/09/18 1135  bacitracin 50,000 Units in sodium chloride 0.9 % 500 mL irrigation  Status:  Discontinued       As needed 11/09/18 1135 11/09/18 1404   11/09/18 0815  ceFAZolin (ANCEF) IVPB 2g/100 mL premix     2 g 200 mL/hr over 30 Minutes Intravenous On call to O.R. 11/09/18 0810 11/09/18 1040   11/09/18 0814  ceFAZolin (ANCEF) 2-4 GM/100ML-% IVPB    Note to Pharmacy: Barbie Haggis   : cabinet override      11/09/18 0814 11/09/18 1040      Discharge Exam: Blood pressure (!) 136/52, pulse 73, temperature 98.3 F (36.8 C), temperature source Oral, resp. rate 16, height 5\' 6"  (1.676 m), weight 67.1 kg, SpO2 99 %.  Exam is stable with chronic significant weakness of the hands left greater than right and left shoulder weakness and right AKA Wound is clean dry and intact   Discharge Medications:   Allergies as of 11/11/2018      Reactions   Other Other (See Comments)   Regarding scans, the PATIENT IS CLAUSTROPHOBIC!!!   Phenergan [promethazine Hcl] Swelling, Other (See Comments)   Cannot be combined with Zofran because swelling of the eyes  resulted and patient became very restless-   Zofran [ondansetron Hcl] Swelling, Other (See Comments)   Cannot be combined with Phenergan because swelling of the eyes resulted and patient became very restless-   Sulfamethoxazole Rash, Itching      Medication List    STOP taking these medications   HYDROcodone-acetaminophen 5-325 MG tablet Commonly known as: NORCO/VICODIN   oxyCODONE-acetaminophen 5-325 MG tablet Commonly known as: PERCOCET/ROXICET     TAKE these medications   brimonidine 0.2 % ophthalmic solution Commonly known as: ALPHAGAN Place 1 drop into both eyes 2 (two) times daily.   cholecalciferol 1000 units tablet Commonly known as: VITAMIN D Take 1,000 Units by mouth daily.   diclofenac sodium 1 % Gel Commonly known as: Voltaren Apply 2 g topically 3 (three) times daily as needed. What changed: reasons to take this   diphenoxylate-atropine 2.5-0.025 MG tablet Commonly known  as: LOMOTIL Take 1 tablet by mouth every 6 (six) hours as needed for diarrhea or loose stools.   Eliquis 5 MG Tabs tablet Generic drug: apixaban TAKE 1 TABLET (5 MG TOTAL) BY MOUTH 2 (TWO) TIMES DAILY. What changed: how much to take   furosemide 40 MG tablet Commonly known as: LASIX Take 0.5 tablets (20 mg total) by mouth daily. What changed: how much to take   gabapentin 100 MG capsule Commonly known as: NEURONTIN Take one at night for one week and then take one twice daily. What changed:   how much to take  how to take this  when to take this  reasons to take this  additional instructions   imatinib 100 MG tablet Commonly known as: GLEEVEC Take 300 mg by mouth daily with lunch. Take with noon meal and a large glass of water.Caution:Chemotherapy   isosorbide mononitrate 30 MG 24 hr tablet Commonly known as: IMDUR Take 1 tablet (30 mg total) by mouth daily.   lidocaine 5 % Commonly known as: LIDODERM Place 1 patch onto the skin daily as needed (for back pain). Remove &  Discard patch within 12 hours or as directed by MD   loperamide 2 MG capsule Commonly known as: IMODIUM Take 2 mg by mouth every 6 (six) hours as needed for diarrhea or loose stools.   multivitamin tablet Take 1 tablet by mouth daily.   multivitamin-lutein Caps capsule Take 1 capsule by mouth daily.   oxycodone 5 MG capsule Commonly known as: OXY-IR Take 1 capsule (5 mg total) by mouth every 6 (six) hours as needed. What changed: reasons to take this   predniSONE 1 MG tablet Commonly known as: DELTASONE Two tablets daily for 2 months then take one tablet daily for 2 months, then stop What changed:   how much to take  how to take this  when to take this  additional instructions   pyridostigmine 60 MG tablet Commonly known as: MESTINON Take 60 mg by mouth daily.   rosuvastatin 10 MG tablet Commonly known as: CRESTOR Take 5 mg by mouth daily.   timolol 0.5 % ophthalmic solution Commonly known as: BETIMOL Place 1 drop into both eyes 2 (two) times daily.       Disposition: CIR   Final Dx: ACDF C3-4, C5 corpectomy  Discharge Instructions    Call MD for:  redness, tenderness, or signs of infection (pain, swelling, redness, odor or green/yellow discharge around incision site)   Complete by: As directed    Call MD for:  severe uncontrolled pain   Complete by: As directed    Call MD for:  temperature >100.4   Complete by: As directed    Diet - low sodium heart healthy   Complete by: As directed    Increase activity slowly   Complete by: As directed    Remove dressing in 24 hours   Complete by: As directed       Follow-up Information    [name], MD. Schedule an appointment as soon as possible for a visit in 2 week(s).   Specialty: Neurosurgery Contact information: [address] [place] [address] [phone]            Signed: [name] 11/11/2018, 2:33 PM

## 2018-11-11 NOTE — Progress Notes (Signed)
Report called to Romilda Garret, 4 W receiving nurse. Questions answered; verbalized understanding. Patient and Spouse at the bedside made aware of transfer to room 4W 07. Elita Boone, BSN, RN

## 2018-11-11 NOTE — PMR Pre-admission (Signed)
PMR Admission Coordinator Pre-Admission Assessment  Patient: [name] is an 78 y.o., male MRN: [id number] DOB: 12/24/1940 Height: '5\' 6"'$  (167.6 cm) Weight: 67.1 kg  Insurance Information HMO:     PPO:      PCP:      IPA:      80/20:      OTHER:  PRIMARY: Medicare A and B      Policy#: [id number]      Subscriber: patient CM Name:       Phone#:      Fax#:  Pre-Cert#: verified eligibility online      Employer: n/a Benefits:  Phone #:      Name:  Eff. Date: 04/10/03 (A and B)     Deduct: $1408      Out of Pocket Max:       Life Max:  CIR: 100%      SNF: 20 full days Outpatient:      Co-Pay:  Home Health:       Co-Pay:  DME:      Co-Pay:  Providers:  SECONDARY: [facility]      Policy#: [id number]      Subscriber:  CM Name:       Phone#:      Fax#:  Pre-Cert#:       Employer:  Benefits:  Phone #:      Name:  Eff. Date:      Deduct:       Out of Pocket Max:       Life Max:  CIR:       SNF:  Outpatient:      Co-Pay:  Home Health:       Co-Pay:  DME:      Co-Pay:   Medicaid Application Date:       Case Manager:  Disability Application Date:       Case Worker:   The "Data Collection Information Summary" for patients in Inpatient Rehabilitation Facilities with attached "Privacy Act [place] Records" was provided and verbally reviewed with: Patient  Emergency Contact Information Contact Information    Name Relation Home Work Mobile   Melander,[name] Spouse [phone]  [phone]      Current Medical History  Patient Admitting Diagnosis: cervical myelopathy, 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  Patient primarily uses a wheelchair for mobility performed lateral scoot transfers and sliding board transfers onto toilet and shower bench.  He had been using his prosthesis but no longer could because of upper extremity weakness.  Wife did assist with some ADLs.  Presented 11/09/2018 with complaints of neck pain, numbness bilateral upper extremities and weakness that is progressed over the last several months.  X-rays and imaging revealed herniated nucleus pulposus with spondylosis/stenosis at C3- 4 C-4-6 with severe myelopathy.  Underwent decompressive anterior cervical discectomy C3-4 with anterior cervical corpectomy anterior cervical arthrodesis C3-4, 4-5 and C5-6 11/09/2018 per Dr. Sherley Bounds.  Hospital course pain management.  Soft cervical collar as directed.  Acute blood loss anemia 9.9.  Decadron protocol as indicated.  Chronic Eliquis to be held 7 days from day of surgery and then could be resumed.  Tolerating a regular diet.  Therapy evaluations completed and patient was recommended for a comprehensive rehab program.    Patient's medical record from [facility] has been reviewed by the rehabilitation admission coordinator and physician.  Past Medical History  Past Medical History:  Diagnosis Date  . Bruises easily    d/t being on Eliquis  . Carotid artery disease ([place])    a. Duplex 05/2014: 19-41% RICA, 7-40% LICA, elevated velocities in right subclavian artery, normal left subclavian artery..  . Chronic back pain    HNP  . Chronic kidney disease (CKD)   . CKD (chronic kidney disease)   . Claustrophobia    takes Ativan as needed  . 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 Coronary atherosclerosis of unspecified type of vessel, native or graft    a. cath in 2003 showed 10-20% LM, scattered 20% prox-mid LAD, 30% more distal LAD, 50-60% stenosis of LAD towards apex, 20% Cx, 30% dRCA, focal 95% stenosis in smaller of 2 branches of PDA, EF  60-65%.  . Diabetes mellitus ([place])    no longer- cancxelled by Dr Hale Bogus  . Diarrhea    takes Imodium daily as needed  . Diverticulosis   . Diverticulosis of colon (without mention of hemorrhage)   . DJD (degenerative joint disease)   . DVT (deep venous thrombosis) ([place]) 07/2011  . Esophageal stricture   . Essential hypertension    takes Imdur and Lisinopril daily  . Gastric polyps    benign  . GERD (gastroesophageal reflux disease)    takes Nexium daily  . Glaucoma    uses eye drops  . History of blood transfusion `   leg amputation  . History of bronchitis    not sure when the last time  . History of colon polyps    benign  . History of kidney stones    litrotrispspy  . History of kidney stones    passed  . Insomnia    takes Melatonin nightly as needed  . Mixed hyperlipidemia    takes Crestor daily  . Myasthenia gravis ([place])    uses prednisone  . Osteoporosis 2016  . Peripheral edema    takes Lasix daily  . Peripheral vascular disease ([place])   . Pituitary tumor    checks Dr[facility]Walden at [facility] checks it every Jan   . Pneumonia 01/2016  . PONV (postoperative nausea and vomiting)   . Ptosis   . Pulmonary embolus ([place]) 2012  . Stroke ([place]) 06/2015   x 3- last one 2018- balance issuses    Family History   family history includes Breast cancer in his sister; Clotting disorder in his sister; Dementia in his sister; Heart attack in his father, mother, sister, sister, and sister; Heart disease in his father, mother, and sister; Stroke in his father and mother.  Prior Rehab/Hospitalizations Has the patient had prior rehab or hospitalizations prior to admission? Yes  Has the patient had major surgery during 100 days prior to admission? Yes   Current Medications  Current Facility-Administered Medications:  .  0.9 %  sodium chloride infusion, 250 mL, Intravenous, Continuous, [name], MD .  0.9 % NaCl with KCl 20 mEq/ L  infusion, , Intravenous, Continuous,  [name], MD, Last Rate: 10 mL/hr at 11/10/18 1034 .  acetaminophen (TYLENOL) tablet 650 mg, 650 mg, Oral, Q4H PRN, 650 mg at 11/11/18 0532 **OR** acetaminophen (TYLENOL) suppository 650 mg, 650 mg, Rectal, Q4H PRN,  [name], MD .  brimonidine [facility]) 0.2 % ophthalmic solution 1 drop, 1 drop, Both Eyes, BID, [name], MD, 1 drop at 11/11/18 1031 .  dexamethasone (DECADRON) injection 2 mg, 2 mg, Intravenous, Q6H **OR** dexamethasone (DECADRON) tablet 2 mg, 2 mg, Oral, Q6H, [name], MD .  diphenoxylate-atropine (LOMOTIL) 2.5-0.025 MG per tablet 1 tablet, 1 tablet, Oral, Q6H PRN, [name], MD .  furosemide (LASIX) tablet 40 mg, 40 mg, Oral, Daily, [name], MD, 40 mg at 11/11/18 1030 .  isosorbide mononitrate (IMDUR) 24 hr tablet 30 mg, 30 mg, Oral, Daily, [name], MD, 30 mg at 11/11/18 1030 .  loperamide (IMODIUM) capsule 2 mg, 2 mg, Oral, Q6H PRN, [name], MD .  menthol-cetylpyridinium (CEPACOL) lozenge 3 mg, 1 lozenge, Oral, PRN **OR** phenol (CHLORASEPTIC) mouth spray 1 spray, 1 spray, Mouth/Throat, PRN, [name], MD .  methocarbamol (ROBAXIN) tablet 500 mg, 500 mg, Oral, Q6H PRN, 500 mg at 11/11/18 0530 **OR** methocarbamol (ROBAXIN) 500 mg in dextrose 5 % 50 mL IVPB, 500 mg, Intravenous, Q6H PRN, [name], MD .  morphine 2 MG/ML injection 2 mg, 2 mg, Intravenous, Q2H PRN, [name], MD, 2 mg at 11/10/18 0011 .  multivitamin (PROSIGHT) tablet 1 tablet, 1 tablet, Oral, Daily, [name], MD, 1 tablet at 11/11/18 1030 .  ondansetron (ZOFRAN) tablet 4 mg, 4 mg, Oral, Q6H PRN **OR** ondansetron (ZOFRAN) injection 4 mg, 4 mg, Intravenous, Q6H PRN, [name], MD .  oxyCODONE (Oxy IR/ROXICODONE) immediate release tablet 5 mg, 5 mg, Oral, Q3H PRN, [name], MD, 5 mg at 11/11/18 0529 .  predniSONE (DELTASONE) tablet 1 mg, 1 mg, Oral, Q breakfast, [name], MD, 1 mg at 11/11/18 0841 .  pyridostigmine (MESTINON) tablet  60 mg, 60 mg, Oral, Daily, [name], MD, 60 mg at 11/10/18 1014 .  senna (SENOKOT) tablet 8.6 mg, 1 tablet, Oral, BID, [name], MD, 8.6 mg at 11/11/18 1030 .  sodium chloride flush (NS) 0.9 % injection 3 mL, 3 mL, Intravenous, Q12H, [name], MD, 3 mL at 11/10/18 2219 .  sodium chloride flush (NS) 0.9 % injection 3 mL, 3 mL, Intravenous, PRN, [name], MD .  timolol (TIMOPTIC) 0.5 % ophthalmic solution 1 drop, 1 drop, Both Eyes, BID, [name], MD, 1 drop at 11/10/18 2215  Patients Current Diet:  Diet Order            Diet regular Room service appropriate? Yes; Fluid consistency: Thin  Diet effective now              Precautions / Restrictions Precautions Precautions: Fall, Cervical, Other (comment) Precaution Booklet Issued: Yes (comment) Precaution Comments: Rt AKA Cervical Brace: Soft collar Restrictions Weight Bearing Restrictions: No   Has the patient had 2 or more falls or a fall with injury in the past year? Yes  Prior Activity Level Limited Community (1-2x/wk): limited mobility since AKA, also with worsening BLE/BUE weakness since July/August  Prior Functional Level Self Care: Did the patient need help bathing, dressing, using the toilet or eating? Needed some help  Indoor Mobility: Did the patient need assistance with walking from room to room (with or without device)? Needed some help  Stairs: Did the patient need assistance with internal or external stairs (with or without device)? Dependent  Functional Cognition: Did the patient need help planning regular tasks such as shopping or  remembering to take medications? Independent  Development worker, international aid / Equipment Home Equipment: Shower seat - built in, [place] - 2 wheels, [place] - single point, Wheelchair - manual, Tub bench  Prior Device Use: Indicate devices/aids used by the patient prior to current illness, exacerbation or injury? Manual wheelchair, Environmental consultant, Orthotics/Prosthetics and  slide board  Current Functional Level Cognition  Overall Cognitive Status: Within Functional Limits for tasks assessed Orientation Level: Oriented X4 General Comments: Emotionally labile at times throughout session, but overall pleasant and appropriate    Extremity Assessment (includes Sensation/Coordination)  Upper Extremity Assessment: RUE deficits/detail, LUE deficits/detail RUE Deficits / Details: 3/5, ltd by some capsular tightness; weak grasp RUE Coordination: decreased fine motor, decreased gross motor LUE Deficits / Details: 2+/5, more pronounced capsular tightness; trigger finger L ring finger, weak grasp LUE Coordination: decreased fine motor, decreased gross motor  Lower Extremity Assessment: Defer to PT evaluation RLE Deficits / Details: AKA; hip strength appears to be at least anti gravity LLE Deficits / Details: Strength 4/5 except hip flexion 3+/5    ADLs  Overall ADL's : Needs assistance/impaired Eating/Feeding: Minimal assistance, Sitting Eating/Feeding Details (indicate cue type and reason): reports dropping utensils freq Grooming: Minimal assistance, Sitting Upper Body Bathing: Moderate assistance, Sitting Lower Body Bathing: Maximal assistance, Sitting/lateral leans, Sit to/from stand, Adhering to back precautions Upper Body Dressing : Moderate assistance, Sitting Lower Body Dressing: Maximal assistance, Adhering to back precautions, Sitting/lateral leans, Sit to/from stand Toilet Transfer: Minimal assistance, Transfer board, Requires drop arm, BSC Toileting- Clothing Manipulation and Hygiene: Minimal assistance, Sitting/lateral lean, Sit to/from stand Functional mobility during ADLs: Minimal assistance, +2 for safety/equipment(SPT with slide board) General ADL Comments: pt ltd 2/2 post surgical precautions, pain, and baseline phys comorbidities    Mobility  Overal bed mobility: Needs Assistance Bed Mobility: Rolling, Sidelying to Sit Rolling: Min  guard Sidelying to sit: Mod assist, HOB elevated General bed mobility comments: Rolling towards right to reach for rail, assist with trunk to get to EOB and to scoot forward.    Transfers  Overall transfer level: Needs assistance Equipment used: None Transfers: Lateral/Scoot Transfers  Lateral/Scoot Transfers: Min assist General transfer comment: Able to laterally scoot towards left side with cues for anterior weight shift; light MIn A needed to scoot bottom otherwise did pretty well. Did not need slide board today.    Ambulation / Gait / Stairs / Wheelchair Mobility  Ambulation/Gait General Gait Details: unable    Posture / Balance Dynamic Sitting Balance Sitting balance - Comments: Supervision for sitting balance Balance Overall balance assessment: Needs assistance Sitting-balance support: (foot supported) Sitting balance-Leahy Scale: Fair Sitting balance - Comments: Supervision for sitting balance    Special needs/care consideration BiPAP/CPAP no CPM no Continuous Drip IV no Dialysis no        Days n/a Life Vest no Oxygen no Special Bed no Trach Size no Wound Vac (area) no      Location n/a Skin surgical incisions to neck                         Bowel mgmt: no BM since admission, typically continent Bladder mgmt: typically continent, now with condom cath 2/2 incontinence Diabetic mgmt: no Behavioral consideration no Chemo/radiation no   Previous Home Environment (from acute therapy documentation) Living Arrangements: Spouse/significant other Available Help at Discharge: Family, Available 24 hours/day Type of Home: House Home Layout: Multi-level, Able to live on main level with bedroom/bathroom, Other (Comment)(stair lift) Home Access: Ramped  entrance Bathroom Shower/Tub: Multimedia programmer: Handicapped height Bathroom Accessibility: Yes How Accessible: Accessible via wheelchair  Discharge Living Setting Plans for Discharge Living Setting: Patient's  home Type of Home at Discharge: House Discharge Home Layout: Multi-level, Able to live on main level with bedroom/bathroom(stair lift to other floors) Alternate Level Stairs-Number of Steps: 9 Discharge Home Access: Ramped entrance Discharge Bathroom Shower/Tub: [place] unit Discharge Bathroom Toilet: Standard Discharge Bathroom Accessibility: Yes How Accessible: Accessible via wheelchair Does the patient have any problems obtaining your medications?: No  Social/Family/Support Systems Patient Roles: Spouse Anticipated Caregiver: Hassan Rowan Anticipated Caregiver's Contact Information: [phone] Ability/Limitations of Caregiver: min assist Caregiver Availability: 24/7 Discharge Plan Discussed with Primary Caregiver: Yes Is Caregiver In Agreement with Plan?: Yes Does Caregiver/Family have Issues with Lodging/Transportation while Pt is in Rehab?: No  Goals/Additional Needs Patient/Family Goal for Rehab: PT/OT supervision Expected length of stay: 10-14 days Dietary Needs: reg/thin Equipment Needs: uses slide board and primary w/c mobility prior to admission Pt/Family Agrees to Admission and willing to participate: Yes Program Orientation Provided & Reviewed with Pt/Caregiver Including Roles  & Responsibilities: Yes  Decrease burden of Care through IP rehab admission: n/a  Possible need for SNF placement upon discharge: No. Pt and family familiar with CIR services and plan for d/c home with family support when program complete.    Patient Condition: I have reviewed medical records from [facility], spoken with CM, and patient and spouse. I met with patient at the bedside for inpatient rehabilitation assessment.  Patient will benefit from ongoing PT and OT, can actively participate in 3 hours of therapy a day 5 days of the week, and can make measurable gains during the admission.  Patient will also benefit from the coordinated team approach during an Inpatient Acute Rehabilitation  admission.  The patient will receive intensive therapy as well as Rehabilitation physician, nursing, social worker, and care management interventions.  Due to bladder management, bowel management, safety, skin/wound care, disease management, medication administration, pain management and patient education the patient requires 24 hour a day rehabilitation nursing.  The patient is currently min to mod assist with mobility and basic ADLs.  Discharge setting and therapy post discharge at home with home health is anticipated.  Patient has agreed to participate in the Acute Inpatient Rehabilitation Program and will admit today.  Preadmission Screen Completed By:  Michel Santee, PT, DPT 11/11/2018 10:35 AM ______________________________________________________________________   Discussed status with Dr. Naaman Plummer on 11/11/18  at 10:43 AM  and received approval for admission today.  Admission Coordinator:  Michel Santee, PT, DPT 10:43 AM Sudie Grumbling 11/11/18    Assessment/Plan: Diagnosis: cervical myelopath s/p acdf 1. Does the need for close, 24 hr/day Medical supervision in concert with the patient's rehab needs make it unreasonable for this patient to be served in a less intensive setting? Yes 2. Co-Morbidities requiring supervision/potential complications: cad, mg, hx right aka 3. Due to bladder management, bowel management, safety, skin/wound care, disease management, medication administration, pain management and patient education, does the patient require 24 hr/day rehab nursing? Yes 4. Does the patient require coordinated care of a physician, rehab nurse, PT (1-2 hrs/day, 5 days/week) and OT (1-2 hrs/day, 5 days/week) to address physical and functional deficits in the context of the above medical diagnosis(es)? Yes Addressing deficits in the following areas: balance, endurance, locomotion, strength, transferring, bowel/bladder control, bathing, dressing, feeding, grooming, toileting and psychosocial  support 5. Can the patient actively participate in an intensive therapy program of at least  3 hrs of therapy 5 days a week? Yes 6. The potential for patient to make measurable gains while on inpatient rehab is excellent 7. Anticipated functional outcomes upon discharge from inpatients are: supervision PT, supervision OT, n/a SLP 8. Estimated rehab length of stay to reach the above functional goals is: 10-14 days 9. Anticipated D/C setting: Home 10. Anticipated post D/C treatments: [place] therapy 11. Overall Rehab/Functional Prognosis: excellent  MD Signature: Meredith Staggers, MD, [facility] 11/11/2018

## 2018-11-12 ENCOUNTER — Inpatient Hospital Stay (HOSPITAL_COMMUNITY): Payer: No Typology Code available for payment source | Admitting: Physical Therapy

## 2018-11-12 ENCOUNTER — Inpatient Hospital Stay (HOSPITAL_COMMUNITY): Payer: No Typology Code available for payment source

## 2018-11-12 MED ORDER — GABAPENTIN 100 MG PO CAPS
100.0000 mg | ORAL_CAPSULE | Freq: Three times a day (TID) | ORAL | Status: DC
  Administered 2018-11-12 – 2018-11-14 (×6): 100 mg via ORAL
  Filled 2018-11-12 (×6): qty 1

## 2018-11-12 MED ORDER — LIDOCAINE 4 % EX CREA
TOPICAL_CREAM | Freq: Four times a day (QID) | CUTANEOUS | Status: DC
  Administered 2018-11-12 – 2018-11-13 (×3): 1 via TOPICAL
  Administered 2018-11-13 (×2): via TOPICAL
  Administered 2018-11-13: 1 via TOPICAL
  Administered 2018-11-14 – 2018-11-16 (×12): via TOPICAL
  Administered 2018-11-17: 1 via TOPICAL
  Administered 2018-11-17 (×2): via TOPICAL
  Administered 2018-11-17 – 2018-11-18 (×4): 1 via TOPICAL
  Administered 2018-11-18: 12:00:00 via TOPICAL
  Administered 2018-11-19 (×2): 1 via TOPICAL
  Administered 2018-11-19 – 2018-11-20 (×4): via TOPICAL
  Administered 2018-11-20 (×2): 1 via TOPICAL
  Administered 2018-11-21: 18:00:00 via TOPICAL
  Administered 2018-11-21 (×2): 1 via TOPICAL
  Administered 2018-11-22: 14:00:00 via TOPICAL
  Administered 2018-11-22: 1 via TOPICAL
  Administered 2018-11-22 – 2018-11-23 (×3): via TOPICAL
  Filled 2018-11-12 (×9): qty 5

## 2018-11-12 NOTE — Evaluation (Signed)
Occupational Therapy Assessment and Plan  Patient Details  Name: [name] MRN: [id number] Date of Birth: [birth year]  OT Diagnosis: acute pain and muscle weakness (generalized) Rehab Potential: Rehab Potential (ACUTE ONLY): Good ELOS: 7-10 days   Today's Date: 11/12/2018 OT Individual Time: 6195-0932 OT Individual Time Calculation (min): 75 min     Problem List:  Patient Active Problem List   Diagnosis Date Noted  . Cervical myelopathy ([place]) 11/11/2018  . S/P cervical spinal fusion 11/09/2018  . Neuropathic pain of right ankle 06/27/2018  . Insomnia due to medical condition 04/13/2018  . Atherosclerosis of native coronary artery of native heart without angina pectoris   . Hypokalemia   . Stage 3 chronic kidney disease   . 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 Acute blood loss anemia 02/08/2018  . Post-operative pain   . Unilateral AKA, right ([place])   . Ischemic foot 02/05/2018  . Ischemia of right lower extremity   . Pressure injury of skin 12/27/2017  . Arterial occlusive disease   . Postoperative pain   . CKD (chronic kidney disease), stage III   . History of CVA (cerebrovascular accident)   . Femoral artery thrombosis, right ([place]) 12/23/2017  . Septic arthritis of left sternoclavicular joint ([place]) 11/03/2017  . Cervical spinal cord compression ([place]) 10/30/2017  . Nasal abscess 09/25/2017  . Immunocompromised ([place]) 09/25/2017  . Acute on chronic blood loss anemia 08/05/2017  . Transient ischemic attack (TIA) 07/23/2017  . Subcortical infarction ([place])   . Right hemiparesis ([place])   . Ataxia 07/21/2017  . TIA (transient ischemic attack)   . Wheezing 06/17/2017  . Bursitis of left elbow 05/01/2017  . Dystonia 12/29/2016  . PAD (peripheral artery disease) ([place]) 11/18/2016  . Osteoporosis 08/28/2016  . Paronychia of great toe, left 08/10/2016  . Carotid arterial disease ([place]) 07/09/2016  . Hypocalcemia 07/09/2016  . Diplopia 07/02/2016  . S/P lumbar laminectomy  03/11/2016  . Radicular pain of left lower extremity 02/21/2016  . Cough 01/10/2016  . 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 Myasthenia gravis ([place])   . Diabetes mellitus type 2 in nonobese (HCC)   . Ataxia, post-stroke   . Gait disturbance, post-stroke   . Proximal leg weakness   . Cervical spondylosis without myelopathy   . Achilles tendinitis of right lower extremity   . Chronic diastolic CHF (congestive heart failure) ([place])   . Gastroesophageal reflux disease without esophagitis   . 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 Cerebellar stroke ([place])   . Ischemic stroke ([place])   . Cerebrovascular accident (CVA) due to thrombosis of left vertebral artery ([place])   . History of pulmonary embolus (PE)   . Chronic anticoagulation   . HLD (hyperlipidemia)   . Neck pain 03/19/2015  . Pain in the chest   . Diabetes mellitus type II, controlled ([place])   . Diabetic gastroparesis ([place]) 03/07/2015  . Screening for osteoporosis 07/19/2014  . Dyspnea 04/09/2014  . Edema 04/09/2014  . Routine general medical examination at a health care facility 10/23/2013  . Bilateral carotid bruits 05/24/2013  . Abnormal CT scan, lung 05/24/2013  . Encounter for therapeutic drug monitoring 03/14/2013  . Lymphadenopathy, inguinal 01/11/2013  . AKI (acute kidney injury) ([place]) 10/28/2012  . 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 Myasthenia gravis with exacerbation ([place]) 03/31/2012  . Disease of salivary gland 12/09/2011  . Left facial pain 10/28/2011  . Pulmonary embolism ([place]) 10/25/2011  . 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 GERD with stricture 09/23/2011  . Long term (current) use of anticoagulants 08/03/2011  . History of pulmonary embolism 07/30/2011  . History of DVT (deep vein thrombosis) 07/30/2011  . Subconjunctival hemorrhage 06/22/2011  . Bilateral conjunctivitis 04/09/2011  . Diabetes mellitus type 2, controlled ([place]) 12/18/2010   . Hearing loss 08/04/2010  . Ptosis of eyelid 12/30/2009  . Essential hypertension 04/24/2009  . HYPERLIPIDEMIA, MIXED 10/05/2008  . HEMORRHOIDS-INTERNAL 07/02/2008  . HEMORRHOIDS-EXTERNAL 07/02/2008  . DIVERTICULOSIS-COLON 07/02/2008  . PERSONAL HX COLONIC POLYPS 07/02/2008  . RASH-NONVESICULAR 02/07/2008  . PNEUMONIA 12/08/2007  . 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 CAD (coronary artery disease) 07/07/2007  . NEPHROLITHIASIS, HX OF 07/07/2007    Past Medical History:  Past Medical History:  Diagnosis Date  . Bruises easily    d/t being on Eliquis  . Carotid artery disease ([place])    a. Duplex 05/2014: 03-47% RICA, 4-25% LICA, elevated velocities in right subclavian artery, normal left subclavian artery..  . Chronic back pain    HNP  . Chronic kidney disease (CKD)   . CKD (chronic kidney disease)   . Claustrophobia    takes Ativan as needed  . CML (chronic myeloid leukemia) ([place])    takes Gleevec daily  . Coronary atherosclerosis of unspecified type of vessel, native or graft    a. cath in 2003 showed 10-20% LM, scattered 20% prox-mid LAD, 30% more distal LAD, 50-60% stenosis of LAD towards apex, 20% Cx, 30% dRCA, focal 95% stenosis in smaller of 2 branches of PDA, EF 60-65%.  . Diabetes mellitus ([place])    no longer- cancxelled by Dr Hale Bogus  . Diarrhea    takes Imodium daily as needed  . Diverticulosis   . Diverticulosis of colon (without mention of hemorrhage)   . DJD (degenerative joint disease)   . DVT (deep venous thrombosis) ([place]) 07/2011  . Esophageal stricture   . Essential hypertension    takes Imdur and Lisinopril daily  . Gastric polyps    benign  . GERD (gastroesophageal reflux disease)    takes Nexium daily  . Glaucoma    uses eye drops  . History of blood transfusion `   leg amputation  . History of bronchitis    not sure when the last time  . History of colon polyps    benign  . History of kidney stones    litrotrispspy  .  History of kidney stones    passed  . Insomnia    takes Melatonin nightly as needed  . Mixed hyperlipidemia    takes Crestor daily  . Myasthenia gravis ([place])    uses prednisone  . Osteoporosis 2016  . Peripheral edema    takes Lasix daily  . Peripheral vascular disease ([place])   . Pituitary tumor    [facility]Walden [facility] checks it every Jan   . Pneumonia 01/2016  . PONV (postoperative nausea and vomiting)   . Ptosis   . Pulmonary embolus ([place]) 2012  . Stroke ([place]) 06/2015   x 3- last one 2018- balance issuses   Past Surgical History:  Past Surgical History:  Procedure Laterality Date  . ABDOMINAL AORTOGRAM W/LOWER EXTREMITY N/A 11/18/2016   Procedure: ABDOMINAL AORTOGRAM W/LOWER EXTREMITY;  Surgeon: [name], MD;  Location: [place] [facility];  Service: Cardiovascular;  Laterality: N/A;  . AMPUTATION Right 02/07/2018   Procedure: right leg AMPUTATION ABOVE KNEE;  Surgeon: [name], MD;  Location[facility]  OR;  Service: Vascular;  Laterality: Right;  . ANTERIOR CERVICAL DECOMP/DISCECTOMY FUSION N/A 11/09/2018   Procedure: CERVICAL THREE-FOUR ANTERIOR CERVICAL DECOMPRESSION/DISCECTOMY FUSION WITH CERVICAL FIVE CORPECTOMY;  Surgeon: [name], MD;  Location: [place];  Service: Neurosurgery;  Laterality: N/A;  . AORTOGRAM Right 12/23/2017   Procedure: AORTOGRAM, RIGHT LOWER EXTREMITY ANGIOGRAM, RIGHT SUPERFICIAL FEMORAL ARTERY ANGIOPLASTY WITH STENT, RIGHT POPLITEAL ARTERY ANGIOPLASTY WITH STENT, LEFT GROIN CUTDOWN AND LEFT FEMORAL ARTERY REPAIR;  Surgeon: [name], MD;  Location: [place];  Service: Vascular;  Laterality: Right;  . BACK SURGERY     x2, lumbar and cervical  . CARDIAC CATHETERIZATION    . CARPAL TUNNEL RELEASE Left 09/23/2018   Procedure: CARPAL TUNNEL RELEASE;  Surgeon: [name], MD;  Location: [place];  Service: Neurosurgery;  Laterality: Left;  CARPAL TUNNEL RELEASE  . CATARACT EXTRACTION Bilateral 12/12  . COLONOSCOPY    .  ESOPHAGOGASTRODUODENOSCOPY ENDOSCOPY  04/2015  . IR GENERIC HISTORICAL  03/03/2016   IR IVC FILTER PLMT / S&I [name] GUID/MOD SED 03/03/2016 [name], MD [facility]INTER[facility] RAD  . IR GENERIC HISTORICAL  04/09/2016   IR RADIOLOGIST EVAL & MGMT 04/09/2016 [name], MD [facility] INTER[facility] RAD  . IR GENERIC HISTORICAL  04/30/2016   IR IVC FILTER RETRIEVAL / S&I [name] GUID/MOD SED 04/30/2016 [name], MD [facility]-INTERV RAD  . LITHOTRIPSY    . LUMBAR LAMINECTOMY/DECOMPRESSION MICRODISCECTOMY Right 09/14/2012   Procedure: Right Lumbar three-four, four-five, Lumbar five-Sacral one decompressive laminectomy;  Surgeon: [name], MD;  Location: [place] NEURO ORS;  Service: Neurosurgery;  Laterality: Right;  . LUMBAR LAMINECTOMY/DECOMPRESSION MICRODISCECTOMY Left 03/11/2016   Procedure: Left Lumbar Two-ThreeMicrodiscectomy;  Surgeon: [name], MD;  Location: [facility];  Service: Neurosurgery;  Laterality: Left;  . PERIPHERAL VASCULAR INTERVENTION  11/18/2016   Procedure: PERIPHERAL VASCULAR INTERVENTION;  Surgeon: [name], MD;  Location: [place] [facility] LAB;  Service: Cardiovascular;;  Left popliteal  . SHOULDER SURGERY Left 05/07/2009  . TONSILLECTOMY    . ULNAR NERVE TRANSPOSITION Left 09/23/2018   Procedure: ULNAR NERVE DECOMPRESSION/TRANSPOSITION;  Surgeon: [name], MD;  Location: [facility];  Service: Neurosurgery;  Laterality: Left;  ULNAR NERVE DECOMPRESSION/TRANSPOSITION    Assessment & Plan Clinical Impression: Arbie Blankley powersis an 78 year old right-handed male history of CAD, ,CML,myasthenia gravis maintained on Mestinon as well as prednisone CKD stage III, right AKA 2019 and received inpatient rehab services 02/10/2018 to 1/11/2020and prosthesis by [employer] prosthetics, and history of pulmonary emboli maintained on Eliquis,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 Per chart review patient lives with spouse. Multilevel level home with ramped entrance in bed and bath on main level.  Patient primarily uses a wheelchair for mobility performed lateral scoot transfers and sliding board transfers onto toilet and shower bench. He had been using his prosthesis but no longer could because of upper extremity weakness. Wife did assist with some ADLs. Presented 11/09/2018 with complaints of neck pain, numbness bilateral upper extremities and weakness that is progressed over the last several months. X-rays and imaging revealed herniated nucleus pulposuswith spondylosis/stenosis at C3-4 C-4-6 with severe myelopathy. Underwent decompressive anterior cervical discectomy C3-4 with anterior cervical corpectomy anterior cervical arthrodesis C3-4, 4-5 and C5-6 11/09/2018 per Dr. Sherley Bounds. Hospital course pain management. Soft cervical collar as directed. Acute blood loss anemia 9.9. Decadron protocol as indicated. Chronic Eliquis to be held 7 days from day of surgery and then could be resumed.. Tolerating a regular diet. Therapy evaluation completed and patient  was admitted for a comprehensive rehab program.Patient transferred to CIR on 11/11/2018 .    Patient currently requires mod with basic self-care skills secondary to muscle weakness, decreased coordination and decreased sitting balance, decreased standing balance, decreased postural control and decreased balance strategies.  Prior to hospitalization, patient could complete ADLs with mod.  Patient will benefit from skilled intervention to decrease level of assist with basic self-care skills prior to discharge home with care partner.  Anticipate patient will require 24 hour supervision and follow up home health.  OT - End of Session Activity Tolerance: Tolerates 10 - 20 min activity with multiple rests Endurance Deficit: Yes Endurance Deficit Description: generalized weakness OT Assessment Rehab Potential (ACUTE ONLY): Good OT Patient demonstrates impairments in the following area(s): Balance;Sensory;Edema;Endurance;Motor;Pain OT  Basic ADL's Functional Problem(s): Grooming;Eating;Bathing;Dressing;Toileting OT Transfers Functional Problem(s): Toilet;Tub/Shower OT Additional Impairment(s): Fuctional Use of Upper Extremity OT Plan OT Intensity: Minimum of 1-2 x/day, 45 to 90 minutes OT Frequency: 5 out of 7 days OT Duration/Estimated Length of Stay: 7-10 days OT Treatment/Interventions: Discharge planning;Balance/vestibular training;Pain management;Self Care/advanced ADL retraining;Therapeutic Activities;UE/LE Coordination activities;Therapeutic Exercise;Skin care/wound managment;Patient/family education;Functional mobility training;Disease mangement/prevention;Community reintegration;DME/adaptive equipment instruction;Neuromuscular re-education;Splinting/orthotics;Wheelchair propulsion/positioning;UE/LE Strength taining/ROM;Psychosocial support OT Self Feeding Anticipated Outcome(s): set up assist OT Basic Self-Care Anticipated Outcome(s): (S) OT Toileting Anticipated Outcome(s): (S) OT Bathroom Transfers Anticipated Outcome(s): (S) OT Recommendation Recommendations for Other Services: Speech consult;Neuropsych consult;Therapeutic Recreation consult Therapeutic Recreation Interventions: Stress management Patient destination: Home Follow Up Recommendations: Home health OT Equipment Recommended: None recommended by OT   Skilled Therapeutic Intervention Skilled OT evaluation completed. Pt edu on ELOS, OT POC, current and prior LOF, and rehab goals/expectations. Pt required mod A for bed mobility to transition sidelying to sitting EOB. Pt completed UB ADLs with min A for thoroughness of pulling shirt down posteriorly. Pt completed LB bathing with lateral leans, requiring no assit for bathing d/t bed providing ability for very far lateral lean. Pt donned shorts with mod A. Pt completed sit <> stand from EOB with RW with mod A. Pt then completed lateral scoot to w/c with min A. Pt completed grooming tasks at sink with min A to  shave around incision for safety. Pt completed lateral scoot transfer to bari- BSC with min A. Pt unable to void BM but voided urine. Pt required mod A to don pants following toileting. Pt intermittently emotional during session re CLOF. Emotional support, therapeutic use of self, and encouragement provided throughout.  Pt returned to w/c and was left sitting up with all needs met, chair alarm belt fastened.    OT Evaluation Precautions/Restrictions  Precautions Precautions: Fall;Cervical Precaution Comments: R AKA Required Braces or Orthoses: Cervical Brace Cervical Brace: Soft collar Restrictions Weight Bearing Restrictions: No General Chart Reviewed: Yes Family/Caregiver Present: No Pain Pain Assessment Pain Scale: 0-10 Pain Score: 5  Pain Type: Acute pain Pain Location: Leg Pain Orientation: Left Pain Descriptors / Indicators: Aching Pain Frequency: Constant Pain Onset: On-going Pain Intervention(s): RN made aware;Medication (See eMAR) Home Living/Prior [place] expects to be discharged to:: Private residence Living Arrangements: Spouse/significant other Available Help at Discharge: Family, Available 24 hours/day Type of Home: House Home Access: Ramped entrance Home Layout: Multi-level, Able to live on main level with bedroom/bathroom, Other (Comment) Alternate Level Stairs-Number of Steps: 9 Alternate Level Stairs-Rails: Left Bathroom Shower/Tub: Multimedia programmer: Handicapped height Bathroom Accessibility: Yes Additional Comments: Pt reports home, including bathroom, can be w/c accessible after reorganized  Lives With: Spouse IADL History Homemaking Responsibilities: No  Current License: No Occupation: Retired Leisure and Hobbies: Marketing executive  Prior Function Level of Independence: Needs assistance with ADLs, Needs assistance with homemaking, Needs assistance with tranfers(wife completing ADLs and IADLs for pt with recent  BUE [place] and grasp strength loss) Bath: Moderate Toileting: Moderate Dressing: Moderate Grooming: Minimal Feeding: Minimal  Able to Take Stairs?: No Driving: No Vocation: Retired Comments: Enjoys spending time with family   Vision Baseline Vision/History: No visual deficits Patient Visual Report: No change from baseline Vision Assessment?: No apparent visual deficits Perception  Perception: Within Functional Limits Praxis Praxis: Intact Cognition Overall Cognitive Status: Within Functional Limits for tasks assessed Arousal/Alertness: Awake/alert Orientation Level: Person;Place;Situation Person: Oriented Place: Oriented Situation: Oriented Year: 2020 Month: October Day of Week: Correct Immediate Memory Recall: Sock;Bed;Blue Memory Recall Sock: Not able to recall Memory Recall Blue: Without Cue Memory Recall Bed: Without Cue Attention: Selective Selective Attention: Appears intact Awareness: Appears intact Problem Solving: Appears intact Safety/Judgment: Appears intact Sensation Sensation Light Touch: Impaired Detail Central sensation comments: Numbness and tinging in B hands Light Touch Impaired Details: Impaired RUE;Impaired LUE Coordination Gross Motor Movements are Fluid and Coordinated: No Fine Motor Movements are Fluid and Coordinated: No Coordination and Movement Description: [place] deficits and weakness Motor  Motor Motor: Other (comment) Motor - Skilled Clinical Observations: generalized weakness Mobility  Bed Mobility Bed Mobility: Rolling Right;Rolling Left;Right Sidelying to Sit Rolling Right: Contact Guard/Touching assist Rolling Left: Contact Guard/Touching assist Right Sidelying to Sit: Moderate Assistance - Patient 50-74% Transfers Sit to Stand: Moderate Assistance - Patient 50-74% Stand to Sit: Moderate Assistance - Patient 50-74%  Trunk/Postural Assessment  Cervical Assessment Cervical Assessment: Exceptions to WFL(s/p cervical sx) Thoracic  Assessment Thoracic Assessment: Exceptions to WFL(rounded shoulders) Lumbar Assessment Lumbar Assessment: Exceptions to WFL(posterior pelvic tilt) Postural Control Postural Control: Deficits on evaluation Righting Reactions: delayed  Balance Balance Balance Assessed: Yes Static Sitting Balance Static Sitting - Balance Support: Bilateral upper extremity supported Static Sitting - Level of Assistance: 5: Stand by assistance Dynamic Sitting Balance Dynamic Sitting - Balance Support: Bilateral upper extremity supported Dynamic Sitting - Level of Assistance: 4: Min assist Static Standing Balance Static Standing - Balance Support: Bilateral upper extremity supported Static Standing - Level of Assistance: 3: Mod assist Static Standing - Comment/# of Minutes: static standing with RW, no prosthetic Extremity/Trunk Assessment RUE Assessment RUE Assessment: Exceptions to [facility] General Strength Comments: 3+/5 overall LUE Assessment LUE Assessment: Exceptions to [facility] Active Range of Motion (AROM) Comments: 4th digit trigger finger General Strength Comments: 3-/5 overall LUE Body System: Ortho LUE AROM (degrees) Left Shoulder Flexion: 75 Degrees Left Shoulder ABduction: 70 Degrees     Refer to Care Plan for Long Term Goals  Recommendations for other services: Neuropsych and Therapeutic Recreation  Stress management   Discharge Criteria: Patient will be discharged from OT if patient refuses treatment 3 consecutive times without medical reason, if treatment goals not met, if there is a change in medical status, if patient makes no progress towards goals or if patient is discharged from hospital.  The above assessment, treatment plan, treatment alternatives and goals were discussed and mutually agreed upon: by patient  Curtis Sites 11/12/2018, 9:01 AM

## 2018-11-12 NOTE — Progress Notes (Signed)
[place] PHYSICAL MEDICINE & REHABILITATION PROGRESS NOTE   Subjective/Complaints:  Pt reports he has a problem with nerve pain/tingling/needles in skin/sandpaper in hands- used to take gabapentin at home-slightly helpful- wasn't sure there were any other choices.  Really tired- just had OT this AM- Willing to try other meds to help nerve pain in hands.  Explained meds/lidocaine cream wouldn't help numbness (could make worse) but could help needle/burning.     Objective:   No results found. No results for input(s): WBC, HGB, HCT, PLT in the last 72 hours. No results for input(s): NA, K, CL, CO2, GLUCOSE, BUN, CREATININE, CALCIUM in the last 72 hours.  Intake/Output Summary (Last 24 hours) at 11/12/2018 1427 Last data filed at 11/12/2018 0452 Gross per 24 hour  Intake 477 ml  Output 675 ml  Net -198 ml     Physical Exam: Vital Signs Blood pressure (!) 147/58, pulse 64, temperature 98.2 F (36.8 C), resp. rate 18, height 5\' 6"  (1.676 m), weight 70.9 kg, SpO2 99 %.  Constitutional: lying in bed; wearing soft collar; appears very tired; constantly rubbing hands, NAD HENT:  Head:Atraumatic.  Eyes:EOMare normal.  Neck:Normal range of motion. Soft cervical collar in place surgical site clean and dry Cardiovascular:Normal rate,regular rhythmand normal heart sounds.  No murmurheard. Respiratory:Effort normal. Nostridor. Norespiratory distress. He hasno wheezes. He hasno rales.  NX:[id number]. He exhibitsno distension. There isno abdominal tenderness.  Genitourinary: Genitourinary Comments: Foley in place, urine yellow,  Musculoskeletal:Normal range of motion.  General: No deformityor edema.  Neurological: Alert and oriented x 3. Reasonable insight and awareness. Normal CN findings. UE4-/5 deltoid, biceps, triceps, 3/5 wrist, hand. RLE: 4/5 HF, LLE: 4-/5 HF, KE and 4/5 ADF/PF. Decreased LT and pain in bilateral hand below distal forearm. DTR's  1+   Skin:No rashnoted. He is not diaphoretic. Noerythema. Surgical incision cdi. Right AK stump well healed. Psychiatric: He has a normal mood and affect. Hisbehavior is normal.      Assessment/Plan: 1. Functional deficits secondary to cervical stenosis/HNP/severe myelopathy which require 3+ hours per day of interdisciplinary therapy in a comprehensive inpatient rehab setting.  Physiatrist is providing close team supervision and 24 hour management of active medical problems listed below.  Physiatrist and rehab team continue to assess barriers to discharge/monitor patient progress toward functional and medical goals  Care Tool:  Bathing    Body parts bathed by patient: Right arm, Left arm, Chest, Abdomen, Front perineal area, Buttocks, Right upper leg, Face, Left lower leg, Left upper leg     Body parts n/a: Right lower leg   Bathing assist Assist Level: Minimal Assistance - Patient > 75%     Upper Body Dressing/Undressing Upper body dressing   What is the patient wearing?: Pull over shirt    Upper body assist Assist Level: Minimal Assistance - Patient > 75%    Lower Body Dressing/Undressing Lower body dressing      What is the patient wearing?: Underwear/pull up, Pants     Lower body assist Assist for lower body dressing: Moderate Assistance - Patient 50 - 74%     Toileting Toileting Toileting Activity did not occur (Clothing management and hygiene only): N/A (no void or bm)(used urinal, no BM)  Toileting assist Assist for toileting: Moderate Assistance - Patient 50 - 74%     Transfers Chair/bed transfer  Transfers assist     Chair/bed transfer assist level: Moderate Assistance - Patient 50 - 74%     Locomotion Ambulation   Ambulation assist  Ambulation activity did not occur: Safety/medical concerns          Walk 10 feet activity   Assist  Walk 10 feet activity did not occur: Safety/medical concerns        Walk 50 feet  activity   Assist Walk 50 feet with 2 turns activity did not occur: Safety/medical concerns         Walk 150 feet activity   Assist Walk 150 feet activity did not occur: Safety/medical concerns         Walk 10 feet on uneven surface  activity   Assist Walk 10 feet on uneven surfaces activity did not occur: Safety/medical concerns         Wheelchair     Assist Will patient use wheelchair at discharge?: Yes Type of Wheelchair: Manual    Wheelchair assist level: Contact Guard/Touching assist Max wheelchair distance: 100'    Wheelchair 50 feet with 2 turns activity    Assist        Assist Level: Contact Guard/Touching assist   Wheelchair 150 feet activity     Assist  Wheelchair 150 feet activity did not occur: Safety/medical concerns       Blood pressure (!) 147/58, pulse 64, temperature 98.2 F (36.8 C), resp. rate 18, height 5\' 6"  (1.676 m), weight 70.9 kg, SpO2 99 %.  Medical Problem List and Plan: 1.Decreased functional ability with upper extremity weaknesssecondary to cervical stenosis/HNP/severe myelopathy. Status post C3-4 anterior cervical corpectomy andAnterior cervical arthrodesis C3-4 4-5 and 5-6 11/09/2018.-admit to inpatient rehab -ELOS 10-14 days. Goals supervision -Soft collar as directed -pt had just begun learning to walk with right AK prosthesis prior to experiencing issues with his neck. Advised him to leave leg at home as it won't aid his ability to transfer. Prosthetic gait training will be something to address once he regains strength after his cervical surgery 2. Antithrombotics: -DVT/anticoagulation:Chronic Eliquis on hold x7 days from day of surgery and then resume -antiplatelet therapy: N/A 3. Pain Management:Oxycodone and Robaxin as needed  -will add Lidocaine cream for hands B/L QID and gabapentin 100 mg TID since took at home. Should be close to max dose base  don renal function. 4. Mood:Provide emotional support -antipsychotic agents: N/A 5. Neuropsych: This patientiscapable of making decisions on hisown behalf. 6. Skin/Wound Care:Routine skin checks 7. Fluids/Electrolytes/Nutrition:Routine in and outs with follow-up chemistries 8. Acute on chronic anemia. Follow-up CBC 9.Chronic myeloid leukemia. Follow-up outpatient 10. CKD stage III. Creatinine baseline 1.63-1.79 11. 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 Patient did receive inpatient rehab services 13. Myasthenia gravis. Continue Mestinon and low-dose chronic prednisone 14. Hypertension. Lasix 40 mg daily, Imdur 30 mg daily.  -follow for pattern -adjust regimen as needed 15. Dysuria/odor/foley/?neurogenic bladder -remove foley -voiding trial -UA equivocal, UCX not sent---send now  -waiting for U Cx- sent 10/3 in AM  16. Neurogenic bowel: -pt having and sensing flatus -augment bowel regimen and observe      LOS: 1 days A FACE TO FACE EVALUATION WAS PERFORMED  [name] 11/12/2018, 2:27 PM

## 2018-11-12 NOTE — Progress Notes (Signed)
Occupational Therapy Session Note  Patient Details  Name: [name] MRN: [id number] Date of Birth: [birth year]  Today's Date: 11/12/2018 OT Individual Time: 3403-5248 OT Individual Time Calculation (min): 57 min    Short Term Goals: Week 1:  OT Short Term Goal 1 (Week 1): Pt will manage 1 container during meals with min A OT Short Term Goal 2 (Week 1): Pt will don pants with lateral leans with mod A OT Short Term Goal 3 (Week 1): Pt will complete BSC transfer with CGA  Skilled Therapeutic Interventions/Progress Updates:    Pt received supine with c/o pain in B hands, stretching/AROM used as pain management throughout session. Pt completed bed mobility to EOB with mod A. Pt wore soft cervical collar throughout session. Pt completed lateral scoot to w/c with min A, cueing for technique. Pt completed 125 ft of w/c propulsion using BUE and L leg. Min A and min cueing for technique provided. Pt required several rest breaks, about every 40-50 ft. Pt completed sit > stand at one parallel bar for UE support with mod A. He was able to release LUE from bar and complete functional reaching across midline with min-mod A for balance support required. Pt then began c/o apparent trigger finger in his L 4th digit. Pt was transported to another therapy gym with splinting supplies. A resting finger extension splint was fabricated for pt and customized to fit. Special care taken to customize splint that would not trigger pt's claustrophobia. Pt reported splint was comfortable and there were no s/s of skin irritation after initial 15 min wearing splint. Pt given verbal instructions re wear at night and care for skin. Pt then guided through tendon gliding exercises with mod cueing required. Pt was provided yellow theraputty and given instructions re use. Pt as returned to his room and left sitting up with all needs met, chair alarm belt fastened.   Therapy Documentation Precautions:  Precautions Precautions: Fall,  Cervical Precaution Comments: R AKA Required Braces or Orthoses: Cervical Brace Cervical Brace: Soft collar(reviewed cervical precautions) Restrictions Weight Bearing Restrictions: No   Therapy/Group: Individual Therapy  Curtis Sites 11/12/2018, 4:36 PM

## 2018-11-12 NOTE — Progress Notes (Signed)
+/-   sleep. Reports taking trazodone PTA. PRN Oxy IR 5mg 's given at 2107 and 0026. Mainly Complains of left thigh and left groin pain-achy, burning and sometimes sharp pain. PRN robaxin given at 0447. LLE with pitting edema. Right anterior neck incision with steris and swelling. Refuses SCD to LLE, "I can't sleep with them on." Congested cough-clear phlegm. Bilateral hand numbness, had PTA, "not much better." [name] A

## 2018-11-12 NOTE — Evaluation (Signed)
Physical Therapy Assessment and Plan  Patient Details  Name: [name] MRN: [id number] Date of Birth: [birth year]  PT Diagnosis: Abnormal posture, Difficulty walking, Impaired sensation and Muscle weakness Rehab Potential: Good ELOS: 7-10 days   Today's Date: 11/12/2018 PT Individual Time: 1000-1100 PT Individual Time Calculation (min): 60 min    Problem List:  Patient Active Problem List   Diagnosis Date Noted  . Cervical myelopathy ([place]) 11/11/2018  . S/P cervical spinal fusion 11/09/2018  . Neuropathic pain of right ankle 06/27/2018  . Insomnia due to medical condition 04/13/2018  . Atherosclerosis of native coronary artery of native heart without angina pectoris   . Hypokalemia   . Stage 3 chronic kidney disease   . 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 Acute blood loss anemia 02/08/2018  . Post-operative pain   . Unilateral AKA, right ([place])   . Ischemic foot 02/05/2018  . Ischemia of right lower extremity   . Pressure injury of skin 12/27/2017  . Arterial occlusive disease   . Postoperative pain   . CKD (chronic kidney disease), stage III   . History of CVA (cerebrovascular accident)   . Femoral artery thrombosis, right ([place]) 12/23/2017  . Septic arthritis of left sternoclavicular joint ([place]) 11/03/2017  . Cervical spinal cord compression ([place]) 10/30/2017  . Nasal abscess 09/25/2017  . Immunocompromised ([place]) 09/25/2017  . Acute on chronic blood loss anemia 08/05/2017  . Transient ischemic attack (TIA) 07/23/2017  . Subcortical infarction ([place])   . Right hemiparesis ([place])   . Ataxia 07/21/2017  . TIA (transient ischemic attack)   . Wheezing 06/17/2017  . Bursitis of left elbow 05/01/2017  . Dystonia 12/29/2016  . PAD (peripheral artery disease) ([place]) 11/18/2016  . Osteoporosis 08/28/2016  . Paronychia of great toe, left 08/10/2016  . Carotid arterial disease ([place]) 07/09/2016  . Hypocalcemia 07/09/2016  . Diplopia 07/02/2016  . S/P lumbar laminectomy  03/11/2016  . Radicular pain of left lower extremity 02/21/2016  . Cough 01/10/2016  . 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 Myasthenia gravis ([place])   . Diabetes mellitus type 2 in nonobese (HCC)   . Ataxia, post-stroke   . Gait disturbance, post-stroke   . Proximal leg weakness   . Cervical spondylosis without myelopathy   . Achilles tendinitis of right lower extremity   . Chronic diastolic CHF (congestive heart failure) ([place])   . Gastroesophageal reflux disease without esophagitis   . 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 Cerebellar stroke ([place])   . Ischemic stroke ([place])   . Cerebrovascular accident (CVA) due to thrombosis of left vertebral artery ([place])   . History of pulmonary embolus (PE)   . Chronic anticoagulation   . HLD (hyperlipidemia)   . Neck pain 03/19/2015  . Pain in the chest   . Diabetes mellitus type II, controlled ([place])   . Diabetic gastroparesis ([place]) 03/07/2015  . Screening for osteoporosis 07/19/2014  . Dyspnea 04/09/2014  . Edema 04/09/2014  . Routine general medical examination at a health care facility 10/23/2013  . Bilateral carotid bruits 05/24/2013  . Abnormal CT scan, lung 05/24/2013  . Encounter for therapeutic drug monitoring 03/14/2013  . Lymphadenopathy, inguinal 01/11/2013  . AKI (acute kidney injury) ([place]) 10/28/2012  . 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 Myasthenia gravis with exacerbation ([place]) 03/31/2012  . Disease of salivary gland 12/09/2011  . Left facial pain 10/28/2011  . Pulmonary embolism ([place]) 10/25/2011  . 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 GERD with stricture 09/23/2011  . Long term (current) use of anticoagulants 08/03/2011  . History of pulmonary embolism 07/30/2011  . History of DVT (deep vein thrombosis) 07/30/2011  . Subconjunctival hemorrhage 06/22/2011  . Bilateral conjunctivitis 04/09/2011  . Diabetes mellitus type 2, controlled ([place]) 12/18/2010   . Hearing loss 08/04/2010  . Ptosis of eyelid 12/30/2009  . Essential hypertension 04/24/2009  . HYPERLIPIDEMIA, MIXED 10/05/2008  . HEMORRHOIDS-INTERNAL 07/02/2008  . HEMORRHOIDS-EXTERNAL 07/02/2008  . DIVERTICULOSIS-COLON 07/02/2008  . PERSONAL HX COLONIC POLYPS 07/02/2008  . RASH-NONVESICULAR 02/07/2008  . PNEUMONIA 12/08/2007  . 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 CAD (coronary artery disease) 07/07/2007  . NEPHROLITHIASIS, HX OF 07/07/2007    Past Medical History:  Past Medical History:  Diagnosis Date  . Bruises easily    d/t being on Eliquis  . Carotid artery disease ([place])    a. Duplex 05/2014: 25-95% RICA, 6-38% LICA, elevated velocities in right subclavian artery, normal left subclavian artery..  . Chronic back pain    HNP  . Chronic kidney disease (CKD)   . CKD (chronic kidney disease)   . Claustrophobia    takes Ativan as needed  . CML (chronic myeloid leukemia) ([place])    takes Gleevec daily  . Coronary atherosclerosis of unspecified type of vessel, native or graft    a. cath in 2003 showed 10-20% LM, scattered 20% prox-mid LAD, 30% more distal LAD, 50-60% stenosis of LAD towards apex, 20% Cx, 30% dRCA, focal 95% stenosis in smaller of 2 branches of PDA, EF 60-65%.  . Diabetes mellitus ([place])    no longer- cancxelled by Dr Hale Bogus  . Diarrhea    takes Imodium daily as needed  . Diverticulosis   . Diverticulosis of colon (without mention of hemorrhage)   . DJD (degenerative joint disease)   . DVT (deep venous thrombosis) ([place]) 07/2011  . Esophageal stricture   . Essential hypertension    takes Imdur and Lisinopril daily  . Gastric polyps    benign  . GERD (gastroesophageal reflux disease)    takes Nexium daily  . Glaucoma    uses eye drops  . History of blood transfusion `   leg amputation  . History of bronchitis    not sure when the last time  . History of colon polyps    benign  . History of kidney stones    litrotrispspy  .  History of kidney stones    passed  . Insomnia    takes Melatonin nightly as needed  . Mixed hyperlipidemia    takes Crestor daily  . Myasthenia gravis ([place])    uses prednisone  . Osteoporosis 2016  . Peripheral edema    takes Lasix daily  . Peripheral vascular disease ([place])   . Pituitary tumor    [facility]den [facility] checks it every Jan   . Pneumonia 01/2016  . PONV (postoperative nausea and vomiting)   . Ptosis   . Pulmonary embolus ([place]) 2012  . Stroke ([place]) 06/2015   x 3- last one 2018- balance issuses   Past Surgical History:  Past Surgical History:  Procedure Laterality Date  . ABDOMINAL AORTOGRAM W/LOWER EXTREMITY N/A 11/18/2016   Procedure: ABDOMINAL AORTOGRAM W/LOWER EXTREMITY;  Surgeon: [name], MD;  Location: [place] [facility];  Service: Cardiovascular;  Laterality: N/A;  . AMPUTATION Right 02/07/2018   Procedure: right leg AMPUTATION ABOVE KNEE;  Surgeon: [name], MD;  Location: [place];  Service: Vascular;  Laterality: Right;  . ANTERIOR CERVICAL DECOMP/DISCECTOMY FUSION N/A 11/09/2018   Procedure: CERVICAL THREE-FOUR ANTERIOR CERVICAL DECOMPRESSION/DISCECTOMY FUSION WITH CERVICAL FIVE CORPECTOMY;  Surgeon: [name], MD;  Location: [place];  Service: Neurosurgery;  Laterality: N/A;  . AORTOGRAM Right 12/23/2017   Procedure: AORTOGRAM, RIGHT LOWER EXTREMITY ANGIOGRAM, RIGHT SUPERFICIAL FEMORAL ARTERY ANGIOPLASTY WITH STENT, RIGHT POPLITEAL ARTERY ANGIOPLASTY WITH STENT, LEFT GROIN CUTDOWN AND LEFT FEMORAL ARTERY REPAIR;  Surgeon: [name], MD;  Location: [place];  Service: Vascular;  Laterality: Right;  . BACK SURGERY     x2, lumbar and cervical  . CARDIAC CATHETERIZATION    . CARPAL TUNNEL RELEASE Left 09/23/2018   Procedure: CARPAL TUNNEL RELEASE;  Surgeon: [name], MD;  Location: [place];  Service: Neurosurgery;  Laterality: Left;  CARPAL TUNNEL RELEASE  . CATARACT EXTRACTION Bilateral 12/12  . COLONOSCOPY    .  ESOPHAGOGASTRODUODENOSCOPY ENDOSCOPY  04/2015  . IR GENERIC HISTORICAL  03/03/2016   IR IVC FILTER PLMT / S&I [name] GUID/MOD SED 03/03/2016 [name], MD [facility]INTER[facility] RAD  . IR GENERIC HISTORICAL  04/09/2016   IR RADIOLOGIST EVAL & MGMT 04/09/2016 [name], MD [facility] INTER[facility] RAD  . IR GENERIC HISTORICAL  04/30/2016   IR IVC FILTER RETRIEVAL / S&I [name] GUID/MOD SED 04/30/2016 [name], MD [facility]-INTERV RAD  . LITHOTRIPSY    . LUMBAR LAMINECTOMY/DECOMPRESSION MICRODISCECTOMY Right 09/14/2012   Procedure: Right Lumbar three-four, four-five, Lumbar five-Sacral one decompressive laminectomy;  Surgeon: [name], MD;  Location: [place] [facility];  Service: Neurosurgery;  Laterality: Right;  . LUMBAR LAMINECTOMY/DECOMPRESSION MICRODISCECTOMY Left 03/11/2016   Procedure: Left Lumbar Two-ThreeMicrodiscectomy;  Surgeon: [name], MD;  Location: [place];  Service: Neurosurgery;  Laterality: Left;  . PERIPHERAL VASCULAR INTERVENTION  11/18/2016   Procedure: PERIPHERAL VASCULAR INTERVENTION;  Surgeon: [name], MD;  Location: [place] [facility];  Service: Cardiovascular;;  Left popliteal  . SHOULDER SURGERY Left 05/07/2009  . TONSILLECTOMY    . ULNAR NERVE TRANSPOSITION Left 09/23/2018   Procedure: ULNAR NERVE DECOMPRESSION/TRANSPOSITION;  Surgeon: [name], MD;  Location: [place];  Service: Neurosurgery;  Laterality: Left;  ULNAR NERVE DECOMPRESSION/TRANSPOSITION    Assessment & Plan Clinical Impression:  [name] powersis an 78 year old right-handed male history of CAD, ,CML,myasthenia gravis maintained on Mestinon as well as prednisone CKD stage III, right AKA 2019 and received inpatient rehab services 02/10/2018 to 1/11/2020and prosthesis by [employer] prosthetics, and history of pulmonary emboli maintained on Eliquis,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 Per chart review patient lives with spouse. Multilevel level home with ramped entrance in bed and bath on main level.  Patient primarily uses a wheelchair for mobility performed lateral scoot transfers and sliding board transfers onto toilet and shower bench. He had been using his prosthesis but no longer could because of upper extremity weakness. Wife did assist with some ADLs. Presented 11/09/2018 with complaints of neck pain, numbness bilateral upper extremities and weakness that is progressed over the last several months. X-rays and imaging revealed herniated nucleus pulposuswith spondylosis/stenosis at C3-4 C-4-6 with severe myelopathy. Underwent decompressive anterior cervical discectomy C3-4 with anterior cervical corpectomy anterior cervical arthrodesis C3-4, 4-5 and C5-6 11/09/2018 per Dr. Sherley Bounds. Hospital course pain management. Soft cervical collar as directed. Acute blood loss anemia 9.9. Decadron protocol as indicated. Chronic Eliquis to be held 7 days from day of surgery and then could be resumed.. Tolerating a regular diet. Therapy evaluation completed and patient was  admitted for a comprehensive rehab program. Patient transferred to CIR on 11/11/2018 .   Patient currently requires min to mod with mobility secondary to muscle weakness, decreased cardiorespiratoy endurance, abnormal tone, unbalanced muscle activation and decreased coordination and decreased sitting balance, decreased standing balance, decreased postural control and decreased balance strategies.  Prior to hospitalization, patient was total with mobility and lived with Spouse in a House home.  Home access is  Ramped entrance.  Patient will benefit from skilled PT intervention to maximize safe functional mobility, minimize fall risk and decrease caregiver burden for planned discharge home with 24 hour assist.  Anticipate patient will benefit from follow up [facility] at discharge.  PT - End of Session Activity Tolerance: Tolerates 30+ min activity with multiple rests Endurance Deficit: Yes Endurance Deficit Description: generalized  weakness PT Assessment Rehab Potential (ACUTE/IP ONLY): Good PT Barriers to Discharge: Medical stability PT Patient demonstrates impairments in the following area(s): Balance;Endurance;Motor;Pain;Safety;Sensory PT Transfers Functional Problem(s): Bed Mobility;Bed to Chair;Car;Furniture;Floor PT Locomotion Functional Problem(s): Ambulation;Wheelchair Mobility;Stairs PT Plan PT Intensity: Minimum of 1-2 x/day ,45 to 90 minutes PT Frequency: 5 out of 7 days PT Duration Estimated Length of Stay: 7-10 days PT Treatment/Interventions: Balance/vestibular training;Community reintegration;Discharge planning;Disease management/prevention;DME/adaptive equipment instruction;Functional mobility training;Neuromuscular re-education;Pain management;Patient/family education;Psychosocial support;Splinting/orthotics;Therapeutic Activities;Therapeutic Exercise;UE/LE Strength taining/ROM;UE/LE Coordination activities;Wheelchair propulsion/positioning PT Transfers Anticipated Outcome(s): Supervision PT Locomotion Anticipated Outcome(s): Supervision at w/c level PT Recommendation Recommendations for Other Services: Neuropsych consult;Therapeutic Recreation consult Therapeutic Recreation Interventions: Stress management Follow Up Recommendations: Home health PT Patient destination: Home Equipment Recommended: None recommended by PT(pt already owns all necessary equipment)  Skilled Therapeutic Intervention Evaluation completed (see details above and below) with education on PT POC and goals and individual treatment initiated with focus on functional transfer assessment, reorientation to rehab unit and schedule, and setting pt up with equipment. Pt received seated in bed, agreeable to PT session. Pt reports 3/10 pain L groin region, reports ongoing pain for several months in this area, premedicated prior to start of session. Bed mobility CGA to min A. Squat pivot transfer bed to w/c with mod A. Pt is dependent to don  LLE TED hose for edema management and tennis shoe for improved grip with mobility. Manual w/c propulsion x 100 ft with use of BUE and LLE and CGA for steering. Pt has difficulty with w/c mobility 2/2 BUE weakness L>R. Slide board transfer w/c to/from mat table with min A. Set pt up with more narrow w/c for improved fit. Pt reports urge to use the bathroom. Squat pivot transfer w/c to platform commode over toilet with min A. Pt is dependent for clothing management via lateral leans on commode. Pt left seated on commode with call button in reach, NT notified.  PT Evaluation Precautions/Restrictions Precautions Precautions: Fall;Cervical Precaution Comments: R AKA Required Braces or Orthoses: Cervical Brace Cervical Brace: Soft collar(reviewed cervical precautions) Restrictions Weight Bearing Restrictions: No Pain Pain Assessment Pain Score: 0-No pain Home Living/Prior Functioning Home Living Available Help at Discharge: Family;Available 24 hours/day Type of Home: House Home Access: Ramped entrance Home Layout: Multi-level;Able to live on main level with bedroom/bathroom;Other (Comment) Additional Comments: (floors carpeted in the home)  Lives With: Spouse Prior Function Level of Independence: Needs assistance with tranfers  Able to Take Stairs?: No Driving: No Vocation: Retired Art gallery manager: Within Advertising copywriter Praxis Praxis: Intact  Cognition Overall Cognitive Status: Within Functional Limits for tasks assessed Arousal/Alertness: Awake/alert Orientation Level: Oriented X4 Attention: Selective Selective Attention: Appears intact Memory: Appears intact Awareness: Appears  intact Problem Solving: Appears intact Behaviors: Lability Safety/Judgment: Appears intact Sensation Sensation Light Touch: Impaired Detail Central sensation comments: Numbness and tinging in B hands Light Touch Impaired Details: Impaired RUE;Impaired LUE;Impaired LLE(L  medial thigh sensitive to touch) Proprioception: Appears Intact Coordination Gross Motor Movements are Fluid and Coordinated: No Fine Motor Movements are Fluid and Coordinated: No Coordination and Movement Description: impaired 2/2 pain and weakness Motor  Motor Motor: Other (comment) Motor - Skilled Clinical Observations: generalized weakness  Mobility Bed Mobility Bed Mobility: Rolling Right;Rolling Left;Right Sidelying to Sit Rolling Right: Contact Guard/Touching assist Rolling Left: Contact Guard/Touching assist Right Sidelying to Sit: Moderate Assistance - Patient 50-74% Transfers Transfers: Lateral/Scoot Transfers Sit to Stand: Moderate Assistance - Patient 50-74% Stand to Sit: Moderate Assistance - Patient 50-74% Lateral/Scoot Transfers: Minimal Assistance - Patient > 75% Transfer (Assistive device): Other (Comment)(slide board; RW to stand) Artist / Additional Locomotion Stairs: No Architect: Yes Wheelchair Assistance: Development worker, international aid: Both upper extremities;Left lower extremity Wheelchair Parts Management: Needs assistance Distance: 100  Trunk/Postural Assessment  Cervical Assessment Cervical Assessment: Exceptions to WFL(cervical precautions) Thoracic Assessment Thoracic Assessment: Exceptions to WFL(rounded shoulders) Lumbar Assessment Lumbar Assessment: Exceptions to WFL(posterior pelvic tilt) Postural Control Postural Control: Deficits on evaluation Righting Reactions: delayed  Balance Balance Balance Assessed: Yes Static Sitting Balance Static Sitting - Balance Support: Bilateral upper extremity supported;Feet supported(LLE supported) Static Sitting - Level of Assistance: 5: Stand by assistance Dynamic Sitting Balance Dynamic Sitting - Balance Support: Bilateral upper extremity supported;During functional activity;Feet supported(LLE supported) Dynamic Sitting - Level of  Assistance: 4: Min assist;3: Mod assist Extremity Assessment   RLE Assessment RLE Assessment: Exceptions to [facility] Passive Range of Motion (PROM) Comments: R AKA General Strength Comments: good hip strength, R AKA LLE Assessment LLE Assessment: Exceptions to [facility] Passive Range of Motion (PROM) Comments: WFL General Strength Comments: impaired, see below LLE Strength Left Hip Flexion: 2+/5 Left Knee Flexion: 4/5 Left Knee Extension: 4/5 Left Ankle Dorsiflexion: 4/5    Refer to Care Plan for Long Term Goals  Recommendations for other services: Neuropsych and Therapeutic Recreation  Stress management  Discharge Criteria: Patient will be discharged from PT if patient refuses treatment 3 consecutive times without medical reason, if treatment goals not met, if there is a change in medical status, if patient makes no progress towards goals or if patient is discharged from hospital.  The above assessment, treatment plan, treatment alternatives and goals were discussed and mutually agreed upon: by patient   Excell Seltzer, PT, DPT 11/12/2018, 12:56 PM

## 2018-11-13 NOTE — Progress Notes (Signed)
Physical Therapy Session Note  Patient Details  Name: [name] MRN: [id number] Date of Birth: [birth year]  Today's Date: 11/13/2018 PT Individual Time: 1000-1100 PT Individual Time Calculation (min): 60 min   Short Term Goals: Week 1:  PT Short Term Goal 1 (Week 1): =LTG due to ELOS  Skilled Therapeutic Interventions/Progress Updates:     Patient in w/c upon PT arrival. Patient alert and agreeable to PT session. Patient reported 4/10 L groin and inner thigh pain during session, stated he was pre-medicated prior to session. PT provided repositioning, rest breaks, and distraction as pain interventions throughout session. Discussed use of kineseo tape for groin pain, and patient in agreement to try taping during session.   Therapeutic Activity: Transfers: Patient performed slide board transfers w/c<>mat table with CGA and total A for board placement. Provided verbal cues for board placement, hand placement, head-hips relationship, increased clearance of hips to avoid friction on skin, and w/c set up for transfers.  PT applied kineseo tape to groin muscles with 0% pull and Y shape for management of groin pain with patient sitting EOM.   He performed a lateral scoot transfer w/c>dorp arm BSC and sit to/from stand x1 using a grab bar in the bathroom to doff shorts during toileting with min A. Provided verbal cues for hand placement and weight shift over L LE during transfers. Required total A to doff shorts during toileting. Patient was initially incontinent of bladder due to sudden urgency, stating he was unable to hold himself. Patient was frustrated and stated he had never experiences this before. PT reassured patient and educated him on potential changes of bowl and bladder following spinal surgery and to monitor frequency of incontinence and inform staff during stay.  Wheelchair Mobility:  Patient propelled wheelchair 150 feet x1 with 1 sitting rest break and x1 with 3 sitting rest breaks with  supervision using B UEs and L LE. Provided verbal cues for turning technique and steering with decreased control of L UE. PT provided orange band around patients L rim to assist with L grip during propulsion. Patient reported that band helped him steer with his L hand.   Therapeutic Exercise: Patient performed the following exercises with verbal and tactile cues for proper technique. -L LAQ 2x10  -L seated hip abduction 2x10 (no increased pain following kineseo taping)  Patient on [facility] with NT in room at end of session with breaks locked and all needs within reach.    Therapy Documentation Precautions:  Precautions Precautions: Fall, Cervical Precaution Comments: R AKA Required Braces or Orthoses: Cervical Brace Cervical Brace: Soft collar(reviewed cervical precautions) Restrictions Weight Bearing Restrictions: No   Therapy/Group: Individual Therapy  Terald Jump L Buel Molder PT, DPT  11/13/2018, 12:49 PM

## 2018-11-13 NOTE — Progress Notes (Signed)
Occupational Therapy Session Note  Patient Details  Name: [name] MRN: [id number] Date of Birth: 04/11/1940  Today's Date: 11/13/2018 OT Individual Time: 1435-1530 OT Individual Time Calculation (min): 55 min    Short Term Goals: Week 1:  OT Short Term Goal 1 (Week 1): Pt will manage 1 container during meals with min A OT Short Term Goal 2 (Week 1): Pt will don pants with lateral leans with mod A OT Short Term Goal 3 (Week 1): Pt will complete BSC transfer with CGA  Skilled Therapeutic Interventions/Progress Updates:    Pt received sitting in w/c with wife Hassan Rowan in room. Discussed wear of his figure 8 splint for his L 4th digit trigger finger. Decrease in hand edema was significant, such that the splint was no longer holding pt in extension. Discussed reservations of making a smaller splint d/t pt fluctuations in edema. Pt's 4th and 3rd L digits were buddy tapped together with coband instead to reduce snapping of tendon into flexion. Pt reported he was very satisfied with this as well. Pt propelled w/c to the therapy gym with slow pace but no physical assist needed, 200 ft. Pt stood at parallel bar and completed LUE functional reaching with [facility] component. Pt completed 3 trials of standing, with mod A to power up and min A to maintain standing balance. Pt then transferred to the therapy mat and completed isolated pincer grasp activity with the LUE. Pt returned to his room and he and his wife were edu on importance of scar massage on L palm incision from recent sx. Demonstration and massage provided to break up very tight tissue over anterior retinaculum. Pt reported relief from some of his L hand pain following. Pt was left sitting up in the w/c with all needs met, chair alarm belt fastened.   Therapy Documentation Precautions:  Precautions Precautions: Fall, Cervical Precaution Comments: R AKA Required Braces or Orthoses: Cervical Brace Cervical Brace: Soft collar(reviewed cervical  precautions) Restrictions Weight Bearing Restrictions: No   Therapy/Group: Individual Therapy  Curtis Sites 11/13/2018, 3:55 PM

## 2018-11-13 NOTE — Progress Notes (Signed)
[facility] PHYSICAL MEDICINE & REHABILITATION PROGRESS NOTE   Subjective/Complaints:  Pt reports hands still feel like sandpaper, but slightly better with lidocaine cream- happy we restarted gabapentin.  Swelling in hands better- which is happy about. Legs still sore- thinks they're swollen.   ROS- Pt denies CP, SOB, N/V/D.     Objective:   No results found. No results for input(s): WBC, HGB, HCT, PLT in the last 72 hours. No results for input(s): NA, K, CL, CO2, GLUCOSE, BUN, CREATININE, CALCIUM in the last 72 hours.  Intake/Output Summary (Last 24 hours) at 11/13/2018 1434 Last data filed at 11/13/2018 1300 Gross per 24 hour  Intake 440 ml  Output 525 ml  Net -85 ml     Physical Exam: Vital Signs Blood pressure (!) 140/55, pulse 62, temperature 97.8 F (36.6 C), resp. rate 17, height 5\' 6"  (1.676 m), weight 70.9 kg, SpO2 100 %.  Constitutional:sitting up in manual w/c, wearing soft collar; constantly rubbing hands, tearful occ.; NAD HENT:  Head:Atraumatic.  Eyes:EOMare normal.  Neck:Normal range of motion. Soft cervical collar in place anterior surgical site clean and dry- looks like glued Cardiovascular:Normal rate,regular rhythmand normal heart sounds.  No murmurheard. Respiratory:Effort normal. Nostridor. Norespiratory distress. He hasno wheezes. He hasno rales.  [id number]. He exhibitsno distension. There isno abdominal tenderness.  Genitourinary: Genitourinary Comments: Foley in place, urine yellow,  Musculoskeletal:Normal range of motion.  General: No deformityor mild 1+ nonpitting edema in LLE Neurological: Alert and oriented x 3. Reasonable insight and awareness. Normal CN findings. UE4-/5 deltoid, biceps, triceps, 3/5 wrist, hand. RLE: 4/5 HF, LLE: 4-/5 HF, KE and 4/5 ADF/PF. Decreased LT and pain in bilateral hand below distal forearm. DTR's 1+ Skin:No rashnoted. He is not diaphoretic. Noerythema. Surgical incision cdi. Right  AK stump well healed. Psychiatric: He has a normal mood and affect. Hisbehavior is normal.      Assessment/Plan: 1. Functional deficits secondary to cervical stenosis/HNP/severe myelopathy which require 3+ hours per day of interdisciplinary therapy in a comprehensive inpatient rehab setting.  Physiatrist is providing close team supervision and 24 hour management of active medical problems listed below.  Physiatrist and rehab team continue to assess barriers to discharge/monitor patient progress toward functional and medical goals  Care Tool:  Bathing    Body parts bathed by patient: Right arm, Left arm, Chest, Abdomen, Front perineal area, Buttocks, Right upper leg, Face, Left lower leg, Left upper leg     Body parts n/a: Right lower leg   Bathing assist Assist Level: Minimal Assistance - Patient > 75%     Upper Body Dressing/Undressing Upper body dressing   What is the patient wearing?: Pull over shirt    Upper body assist Assist Level: Minimal Assistance - Patient > 75%    Lower Body Dressing/Undressing Lower body dressing      What is the patient wearing?: Pants     Lower body assist Assist for lower body dressing: Minimal Assistance - Patient > 75%     Toileting Toileting Toileting Activity did not occur (Clothing management and hygiene only): N/A (no void or bm)(used urinal, no BM)  Toileting assist Assist for toileting: Minimal Assistance - Patient > 75%     Transfers Chair/bed transfer  Transfers assist     Chair/bed transfer assist level: Contact Guard/Touching assist Chair/bed transfer assistive device: Sliding board   Locomotion Ambulation   Ambulation assist   Ambulation activity did not occur: Safety/medical concerns          Walk 10  feet activity   Assist  Walk 10 feet activity did not occur: Safety/medical concerns        Walk 50 feet activity   Assist Walk 50 feet with 2 turns activity did not occur: Safety/medical  concerns         Walk 150 feet activity   Assist Walk 150 feet activity did not occur: Safety/medical concerns         Walk 10 feet on uneven surface  activity   Assist Walk 10 feet on uneven surfaces activity did not occur: Safety/medical concerns         Wheelchair     Assist Will patient use wheelchair at discharge?: Yes Type of Wheelchair: Manual    Wheelchair assist level: Supervision/Verbal cueing Max wheelchair distance: 100'    Wheelchair 50 feet with 2 turns activity    Assist        Assist Level: Supervision/Verbal cueing   Wheelchair 150 feet activity     Assist  Wheelchair 150 feet activity did not occur: Safety/medical concerns       Blood pressure (!) 140/55, pulse 62, temperature 97.8 F (36.6 C), resp. rate 17, height 5\' 6"  (1.676 m), weight 70.9 kg, SpO2 100 %.  Medical Problem List and Plan: 1.Decreased functional ability with upper extremity weaknesssecondary to cervical stenosis/HNP/severe myelopathy. Status post C3-4 anterior cervical corpectomy andAnterior cervical arthrodesis C3-4 4-5 and 5-6 11/09/2018.-admit to inpatient rehab -ELOS 10-14 days. Goals supervision -Soft collar as directed -pt had just begun learning to walk with right AK prosthesis prior to experiencing issues with his neck. Advised him to leave leg at home as it won't aid his ability to transfer. Prosthetic gait training will be something to address once he regains strength after his cervical surgery 2. Antithrombotics: -DVT/anticoagulation:Chronic Eliquis on hold x7 days from day of surgery and then resume -antiplatelet therapy: N/A 3. Pain Management:Oxycodone and Robaxin as needed  -will add Lidocaine cream for hands B/L QID and gabapentin 100 mg TID since took at home. Should be close to max dose based on renal function. 4. Mood:Provide emotional support -antipsychotic agents:  N/A 5. Neuropsych: This patientiscapable of making decisions on hisown behalf. 6. Skin/Wound Care:Routine skin checks 7. Fluids/Electrolytes/Nutrition:Routine in and outs with follow-up chemistries 8. Acute on chronic anemia. Follow-up CBC 9.Chronic myeloid leukemia. Follow-up outpatient 10. CKD stage III. Creatinine baseline 1.63-1.79 11. 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 Patient did receive inpatient rehab services 13. Myasthenia gravis. Continue Mestinon and low-dose chronic prednisone 14. Hypertension. Lasix 40 mg daily, Imdur 30 mg daily.  -follow for pattern -adjust regimen as needed 15. Dysuria/odor/foley/?neurogenic bladder -remove foley -voiding trial -UA equivocal, UCX not sent---send now  -waiting for U Cx- sent 10/3 in AM   -10/4- >100k GNRs- awaiting final Cx/sensitivities 16. Neurogenic bowel: -pt having and sensing flatus -augment bowel regimen and observe  LBM 10/4 AM      LOS: 2 days A FACE TO FACE EVALUATION WAS PERFORMED  [name] 11/13/2018, 2:34 PM

## 2018-11-14 ENCOUNTER — Inpatient Hospital Stay (HOSPITAL_COMMUNITY): Payer: No Typology Code available for payment source | Admitting: Occupational Therapy

## 2018-11-14 ENCOUNTER — Inpatient Hospital Stay (HOSPITAL_COMMUNITY): Payer: No Typology Code available for payment source | Admitting: Physical Therapy

## 2018-11-14 DIAGNOSIS — S78111A Complete traumatic amputation at level between right hip and knee, initial encounter: Secondary | ICD-10-CM

## 2018-11-14 DIAGNOSIS — B9689 Other specified bacterial agents as the cause of diseases classified elsewhere: Secondary | ICD-10-CM

## 2018-11-14 DIAGNOSIS — G9520 Unspecified cord compression: Secondary | ICD-10-CM

## 2018-11-14 DIAGNOSIS — G7000 Myasthenia gravis without (acute) exacerbation: Secondary | ICD-10-CM

## 2018-11-14 DIAGNOSIS — R739 Hyperglycemia, unspecified: Secondary | ICD-10-CM

## 2018-11-14 DIAGNOSIS — E8809 Other disorders of plasma-protein metabolism, not elsewhere classified: Secondary | ICD-10-CM

## 2018-11-14 DIAGNOSIS — N390 Urinary tract infection, site not specified: Secondary | ICD-10-CM

## 2018-11-14 DIAGNOSIS — E46 Unspecified protein-calorie malnutrition: Secondary | ICD-10-CM

## 2018-11-14 DIAGNOSIS — N1831 Chronic kidney disease, stage 3a: Secondary | ICD-10-CM

## 2018-11-14 DIAGNOSIS — T380X5A Adverse effect of glucocorticoids and synthetic analogues, initial encounter: Secondary | ICD-10-CM

## 2018-11-14 DIAGNOSIS — D62 Acute posthemorrhagic anemia: Secondary | ICD-10-CM

## 2018-11-14 LAB — CBC WITH DIFFERENTIAL/PLATELET
Abs Immature Granulocytes: 0.1 10*3/uL — ABNORMAL HIGH (ref 0.00–0.07)
Basophils Absolute: 0 10*3/uL (ref 0.0–0.1)
Basophils Relative: 0 %
Eosinophils Absolute: 0 10*3/uL (ref 0.0–0.5)
Eosinophils Relative: 0 %
HCT: 25.5 % — ABNORMAL LOW (ref 39.0–52.0)
Hemoglobin: 8.6 g/dL — ABNORMAL LOW (ref 13.0–17.0)
Immature Granulocytes: 1 %
Lymphocytes Relative: 8 %
Lymphs Abs: 0.7 10*3/uL (ref 0.7–4.0)
MCH: 31.3 pg (ref 26.0–34.0)
MCHC: 33.7 g/dL (ref 30.0–36.0)
MCV: 92.7 fL (ref 80.0–100.0)
Monocytes Absolute: 0.5 10*3/uL (ref 0.1–1.0)
Monocytes Relative: 6 %
Neutro Abs: 8 10*3/uL — ABNORMAL HIGH (ref 1.7–7.7)
Neutrophils Relative %: 85 %
Platelets: 246 10*3/uL (ref 150–400)
RBC: 2.75 MIL/uL — ABNORMAL LOW (ref 4.22–5.81)
RDW: 16.6 % — ABNORMAL HIGH (ref 11.5–15.5)
WBC: 9.4 10*3/uL (ref 4.0–10.5)
nRBC: 0 % (ref 0.0–0.2)

## 2018-11-14 LAB — COMPREHENSIVE METABOLIC PANEL
ALT: 14 U/L (ref 0–44)
AST: 25 U/L (ref 15–41)
Albumin: 3.1 g/dL — ABNORMAL LOW (ref 3.5–5.0)
Alkaline Phosphatase: 48 U/L (ref 38–126)
Anion gap: 8 (ref 5–15)
BUN: 24 mg/dL — ABNORMAL HIGH (ref 8–23)
CO2: 28 mmol/L (ref 22–32)
Calcium: 8.4 mg/dL — ABNORMAL LOW (ref 8.9–10.3)
Chloride: 102 mmol/L (ref 98–111)
Creatinine, Ser: 1.38 mg/dL — ABNORMAL HIGH (ref 0.61–1.24)
GFR calc Af Amer: 56 mL/min — ABNORMAL LOW (ref >60)
GFR calc non Af Amer: 48 mL/min — ABNORMAL LOW (ref >60)
Glucose, Bld: 173 mg/dL — ABNORMAL HIGH (ref 70–99)
Potassium: 3.9 mmol/L (ref 3.5–5.1)
Sodium: 138 mmol/L (ref 135–145)
Total Bilirubin: 0.9 mg/dL (ref 0.3–1.2)
Total Protein: 5.5 g/dL — ABNORMAL LOW (ref 6.5–8.1)

## 2018-11-14 LAB — URINE CULTURE: Culture: >=100000 — AB

## 2018-11-14 MED ORDER — CIPROFLOXACIN HCL 500 MG PO TABS
500.0000 mg | ORAL_TABLET | Freq: Two times a day (BID) | ORAL | Status: DC
  Administered 2018-11-14 – 2018-11-16 (×5): 500 mg via ORAL
  Filled 2018-11-14 (×5): qty 1

## 2018-11-14 MED ORDER — PRO-STAT SUGAR FREE PO LIQD: 30.0000 mL | Freq: Two times a day (BID) | ORAL | Status: DC

## 2018-11-14 MED ORDER — PRO-STAT SUGAR FREE PO LIQD
30.0000 mL | Freq: Two times a day (BID) | ORAL | Status: DC
  Administered 2018-11-14 – 2018-11-23 (×18): 30 mL via ORAL
  Filled 2018-11-14 (×19): qty 30

## 2018-11-14 MED ORDER — GABAPENTIN 100 MG PO CAPS
200.0000 mg | ORAL_CAPSULE | Freq: Three times a day (TID) | ORAL | Status: DC
  Administered 2018-11-14 – 2018-11-21 (×23): 200 mg via ORAL
  Filled 2018-11-14 (×24): qty 2

## 2018-11-14 NOTE — Progress Notes (Signed)
Occupational Therapy Session Note  Patient Details  Name: [name] MRN: [id number] Date of Birth: 01-Sep-1940  Today's Date: 11/14/2018 OT Individual Time: 1117-1203 and [id number] OT Individual Time Calculation (min): 46 min and 72 min  Short Term Goals: Week 1:  OT Short Term Goal 1 (Week 1): Pt will manage 1 container during meals with min A OT Short Term Goal 2 (Week 1): Pt will don pants with lateral leans with mod A OT Short Term Goal 3 (Week 1): Pt will complete BSC transfer with CGA  Skilled Therapeutic Interventions/Progress Updates:    Pt greeted in his w/c with spouse present. Spouse had just brought in his prosthetic and he was willing to try sit<stands while wearing it during session. When OT rolled up liner, pt was able to pull it up onto thigh. Mod A for the sock and leg component. Mod A for sit<stand at the sink to lock prosthetic into place and adjust fit. Next worked on standing endurance via standing at the sink. After power ups, pt required Min A for balance with B UE support on the sink. Longest standing window without rest 5 minutes 30 seconds! We celebrated! Note that pt was unable to bend the knee component of prosthetic during all stand<sits. At end of session pt returned to sitting and removed prosthetic. Agreeable to keep liner on for tx session after lunch. He was left with all needs within reach and chair alarm set.   2nd Session 1:1 tx (72 min) Pt greeted in the w/c, motivated to go outside. After pt donned face mask and prosthetic he was escorted to the outdoor courtyard. Continued working on standing endurance while wearing his prosthetic. Mod A sit<stand at stable surface and Min A for standing balance. Had pt remove 1 UE support from surface in prep for making standing functional in time. UB strengthening with seated isometric pushes, 5 reps 2 sets. W/c propulsion over uneven surfaces, up/down inclines and around obstacles. Pt required Min-Mod A for w/c mobility  due to UE weakness and pain/sensation deficits. He rested his Lt hand on warm bricks to relieve pain and soreness. Pt was then taken back to the unit, left him with all needs within reach and spouse present. He expressed gratefulness for having the opportunity to go outdoors.   Note that pt was still unable to bend the knee component of prosthetic during stand<sits  Therapy Documentation Precautions:  Precautions Precautions: Fall, Cervical Precaution Comments: R AKA Required Braces or Orthoses: Cervical Brace Cervical Brace: Soft collar(reviewed cervical precautions) Restrictions Weight Bearing Restrictions: No Pain: Pt reports being premedicated for pain during 1st session. At end of 2nd session, pt reported he will call for RN once he's finished eating to receive his scheduled pain medicine    ADL:       Therapy/Group: Individual Therapy  Juanita Devincent A [facility] 11/14/2018, 12:24 PM

## 2018-11-14 NOTE — Progress Notes (Signed)
Physical Therapy Session Note  Patient Details  Name: [name] MRN: [id number] Date of Birth: [birth year]  Today's Date: 11/14/2018 PT Individual Time: 0845-0958 PT Individual Time Calculation (min): 73 min   Short Term Goals: Week 1:  PT Short Term Goal 1 (Week 1): =LTG due to ELOS  Skilled Therapeutic Interventions/Progress Updates:   Pt in supine and agreeable to therapy, pain as detailed below. Bed mobility w/ supervision and max assist to don soft collar. Squat pivot transfer to w/c w/ min-mod assist.   Supervision-min assist to perform self-care tasks at sink including upper body dressing, washing upper body, and brushing teeth. Most assist needed to prevent cervical movement w/o collar on for brief periods and to assist w/ BUE tasks 2/2 weakness. Mod assist sit<>stand to RW and remove pants. Washed lower body w/ supervision, except total assist for back and buttocks. Pt donned shorts w/ supervision from seated level, mod assist to pull up over hips in stance. Verbal and tactile cues throughout for technique from seated level and encouragement to use UEs as much as he can, pt repeatedly stating how weak his UEs felt.  Pt self-propelled w/c to/from therapy gym w/ BUEs and LLE, brief rest break every 50-75'. Worked on sit<>stands to RW in therapy gym, mod assist overall to boost and stabilize in stance. Stood 20-30 sec x4 reps w/ min assist. Verbal and tactile cues for technique w/ UE placement. LLE strengthening exercises during seated rest breaks including LAQs 3x10, knee marches 3x10, and hamstring curls 3x10.   Ended session in w/c and all needs in reach.   Therapy Documentation Precautions:  Precautions Precautions: Fall, Cervical Precaution Comments: R AKA Required Braces or Orthoses: Cervical Brace Cervical Brace: Soft collar(reviewed cervical precautions) Restrictions Weight Bearing Restrictions: No Vital Signs:  Pain: Pain Assessment Pain Scale: 0-10 Pain Score:  0-No pain Pain Type: Acute pain;Surgical pain Pain Location: Leg Pain Orientation: Left Pain Descriptors / Indicators: Aching Pain Frequency: Constant Pain Onset: On-going Pain Intervention(s): Medication (See eMAR)  Therapy/Group: Individual Therapy  [name] 11/14/2018, 9:58 AM

## 2018-11-14 NOTE — Progress Notes (Signed)
Inpatient Rehabilitation  Patient information reviewed and entered into eRehab system by Joda Braatz M. Jadi Deyarmin, M.A., CCC/SLP, PPS Coordinator.  Information including medical coding, functional ability and quality indicators will be reviewed and updated through discharge.    

## 2018-11-14 NOTE — Progress Notes (Signed)
Social Work Assessment and Plan   Patient Details  Name: [name] MRN: [id number] Date of Birth: Oct 27, 1940  Today's Date: 11/14/2018  Problem List:  Patient Active Problem List   Diagnosis Date Noted  . Chronic myeloid leukemia ([place])   . Labile blood pressure   . Acute on chronic anemia   . Neuropathic pain   . Hypoalbuminemia due to protein-calorie malnutrition ([place])   . Steroid-induced hyperglycemia   . UTI due to Klebsiella species   . Cervical myelopathy ([place]) 11/11/2018  . S/P cervical spinal fusion 11/09/2018  . Neuropathic pain of right ankle 06/27/2018  . Insomnia due to medical condition 04/13/2018  . Atherosclerosis of native coronary artery of native heart without angina pectoris   . Hypokalemia   . Stage 3 chronic kidney disease   . 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 Acute blood loss anemia 02/08/2018  . Post-operative pain   . Unilateral AKA, right ([place])   . Ischemic foot 02/05/2018  . Ischemia of right lower extremity   . Pressure injury of skin 12/27/2017  . Arterial occlusive disease   . Postoperative pain   . CKD (chronic kidney disease), stage III   . History of CVA (cerebrovascular accident)   . 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 Septic arthritis of left sternoclavicular joint ([place]) 11/03/2017  . Cervical spinal cord compression ([place]) 10/30/2017  . Nasal abscess 09/25/2017  . Immunocompromised ([place]) 09/25/2017  . Acute on chronic blood loss anemia 08/05/2017  . Transient ischemic attack (TIA) 07/23/2017  . Subcortical infarction ([place])   . Right hemiparesis ([place])   . Ataxia 07/21/2017  . TIA (transient ischemic attack)   . Wheezing 06/17/2017  . Bursitis of left elbow 05/01/2017  . Dystonia 12/29/2016  . PAD (peripheral artery disease) ([place]) 11/18/2016  . Osteoporosis 08/28/2016  . Paronychia of great toe, left 08/10/2016  . Carotid arterial disease ([place]) 07/09/2016  . Hypocalcemia 07/09/2016  . Diplopia 07/02/2016  .  S/P lumbar laminectomy 03/11/2016  . Radicular pain of left lower extremity 02/21/2016  . Cough 01/10/2016  . 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 Myasthenia gravis ([place])   . Diabetes mellitus type 2 in nonobese (HCC)   . Ataxia, post-stroke   . Gait disturbance, post-stroke   . Proximal leg weakness   . Cervical spondylosis without myelopathy   . Achilles tendinitis of right lower extremity   . Chronic diastolic CHF (congestive heart failure) ([place])   . Gastroesophageal reflux disease without esophagitis   . 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 Cerebellar stroke ([place])   . Ischemic stroke ([place])   . Cerebrovascular accident (CVA) due to thrombosis of left vertebral artery ([place])   . History of pulmonary embolus (PE)   . Chronic anticoagulation   . HLD (hyperlipidemia)   . Neck pain 03/19/2015  . Pain in the chest   . Diabetes mellitus type II, controlled ([place])   . Diabetic gastroparesis ([name]) 03/07/2015  . Screening for osteoporosis 07/19/2014  . Dyspnea 04/09/2014  . Edema 04/09/2014  . Routine general medical examination at a health care facility 10/23/2013  . Bilateral carotid bruits 05/24/2013  . Abnormal CT scan, lung 05/24/2013  . Encounter for therapeutic drug monitoring 03/14/2013  . Lymphadenopathy, inguinal 01/11/2013  . AKI (acute kidney injury) ([place]) 10/28/2012  . 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 Myasthenia gravis with exacerbation ([place]) 03/31/2012  . Disease of salivary gland 12/09/2011  .  Left facial pain 10/28/2011  . Pulmonary embolism ([place]) 10/25/2011  . 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 GERD with stricture 09/23/2011  . Long term (current) use of anticoagulants 08/03/2011  . History of pulmonary embolism 07/30/2011  . History of DVT (deep vein thrombosis) 07/30/2011  . Subconjunctival hemorrhage 06/22/2011  . Bilateral conjunctivitis 04/09/2011  . Diabetes mellitus type 2,  controlled ([place]) 12/18/2010  . Hearing loss 08/04/2010  . Ptosis of eyelid 12/30/2009  . Essential hypertension 04/24/2009  . HYPERLIPIDEMIA, MIXED 10/05/2008  . HEMORRHOIDS-INTERNAL 07/02/2008  . HEMORRHOIDS-EXTERNAL 07/02/2008  . DIVERTICULOSIS-COLON 07/02/2008  . PERSONAL HX COLONIC POLYPS 07/02/2008  . RASH-NONVESICULAR 02/07/2008  . PNEUMONIA 12/08/2007  . Chronic myeloid leukemia in remission ([place]) 07/07/2007  . CAD (coronary artery disease) 07/07/2007  . NEPHROLITHIASIS, HX OF 07/07/2007   Past Medical History:  Past Medical History:  Diagnosis Date  . Bruises easily    d/t being on Eliquis  . Carotid artery disease ([place])    a. Duplex 05/2014: 123456 RICA, 123456 LICA, elevated velocities in right subclavian artery, normal left subclavian artery..  . Chronic back pain    HNP  . Chronic kidney disease (CKD)   . CKD (chronic kidney disease)   . Claustrophobia    takes Ativan as needed  . CML (chronic myeloid leukemia) ([place])    takes Gleevec daily  . Coronary atherosclerosis of unspecified type of vessel, native or graft    a. cath in 2003 showed 10-20% LM, scattered 20% prox-mid LAD, 30% more distal LAD, 50-60% stenosis of LAD towards apex, 20% Cx, 30% dRCA, focal 95% stenosis in smaller of 2 branches of PDA, EF 60-65%.  . Diabetes mellitus ([place])    no longer- cancxelled by Dr Hale Bogus  . Diarrhea    takes Imodium daily as needed  . Diverticulosis   . Diverticulosis of colon (without mention of hemorrhage)   . DJD (degenerative joint disease)   . DVT (deep venous thrombosis) ([place]) 07/2011  . Esophageal stricture   . Essential hypertension    takes Imdur and Lisinopril daily  . Gastric polyps    benign  . GERD (gastroesophageal reflux disease)    takes Nexium daily  . Glaucoma    uses eye drops  . History of blood transfusion `   leg amputation  . History of bronchitis    not sure when the last time  . History of colon polyps    benign  . History of kidney  stones    litrotrispspy  . History of kidney stones    passed  . Insomnia    takes Melatonin nightly as needed  . Mixed hyperlipidemia    takes Crestor daily  . Myasthenia gravis ([place])    uses prednisone  . Osteoporosis 2016  . Peripheral edema    takes Lasix daily  . Peripheral vascular disease ([place])   . Pituitary tumor    checks [facility]Walden at [facility] checks it every Jan   . Pneumonia 01/2016  . PONV (postoperative nausea and vomiting)   . Ptosis   . Pulmonary embolus ([place]) 2012  . Stroke ([place]) 06/2015   x 3- last one 2018- balance issuses   Past Surgical History:  Past Surgical History:  Procedure Laterality Date  . ABDOMINAL AORTOGRAM W/LOWER EXTREMITY N/A 11/18/2016   Procedure: ABDOMINAL AORTOGRAM W/LOWER EXTREMITY;  Surgeon: [name], MD;  Location: [place] [facility];  Service: Cardiovascular;  Laterality: N/A;  . AMPUTATION Right 02/07/2018   Procedure: right leg  AMPUTATION ABOVE KNEE;  Surgeon: [name], MD;  Location: [place];  Service: Vascular;  Laterality: Right;  . ANTERIOR CERVICAL DECOMP/DISCECTOMY FUSION N/A 11/09/2018   Procedure: CERVICAL THREE-FOUR ANTERIOR CERVICAL DECOMPRESSION/DISCECTOMY FUSION WITH CERVICAL FIVE CORPECTOMY;  Surgeon: [name], MD;  Location: [place];  Service: Neurosurgery;  Laterality: N/A;  . AORTOGRAM Right 12/23/2017   Procedure: AORTOGRAM, RIGHT LOWER EXTREMITY ANGIOGRAM, RIGHT SUPERFICIAL FEMORAL ARTERY ANGIOPLASTY WITH STENT, RIGHT POPLITEAL ARTERY ANGIOPLASTY WITH STENT, LEFT GROIN CUTDOWN AND LEFT FEMORAL ARTERY REPAIR;  Surgeon: [name], MD;  Location: [place];  Service: Vascular;  Laterality: Right;  . BACK SURGERY     x2, lumbar and cervical  . CARDIAC CATHETERIZATION    . CARPAL TUNNEL RELEASE Left 09/23/2018   Procedure: CARPAL TUNNEL RELEASE;  Surgeon: [name], MD;  Location: [place];  Service: Neurosurgery;  Laterality: Left;  CARPAL TUNNEL RELEASE  . CATARACT EXTRACTION Bilateral  12/12  . COLONOSCOPY    . ESOPHAGOGASTRODUODENOSCOPY ENDOSCOPY  04/2015  . IR GENERIC HISTORICAL  03/03/2016   IR IVC FILTER PLMT / S&I [name] GUID/MOD SED 03/03/2016 [name], MD [facility]INTER[facility] RAD  . IR GENERIC HISTORICAL  04/09/2016   IR RADIOLOGIST EVAL & MGMT 04/09/2016 [name], MD [facility] INTER[facility] RAD  . IR GENERIC HISTORICAL  04/30/2016   IR IVC FILTER RETRIEVAL / S&I [name] GUID/MOD SED 04/30/2016 [name], MD [facility]-INTERV RAD  . LITHOTRIPSY    . LUMBAR LAMINECTOMY/DECOMPRESSION MICRODISCECTOMY Right 09/14/2012   Procedure: Right Lumbar three-four, four-five, Lumbar five-Sacral one decompressive laminectomy;  Surgeon: [name], MD;  Location: [place] NEURO ORS;  Service: Neurosurgery;  Laterality: Right;  . LUMBAR LAMINECTOMY/DECOMPRESSION MICRODISCECTOMY Left 03/11/2016   Procedure: Left Lumbar Two-ThreeMicrodiscectomy;  Surgeon: [name], MD;  Location: [place];  Service: Neurosurgery;  Laterality: Left;  . PERIPHERAL VASCULAR INTERVENTION  11/18/2016   Procedure: PERIPHERAL VASCULAR INTERVENTION;  Surgeon: [name], MD;  Location: [place] [facility];  Service: Cardiovascular;;  Left popliteal  . SHOULDER SURGERY Left 05/07/2009  . TONSILLECTOMY    . ULNAR NERVE TRANSPOSITION Left 09/23/2018   Procedure: ULNAR NERVE DECOMPRESSION/TRANSPOSITION;  Surgeon: [name], MD;  Location: [place];  Service: Neurosurgery;  Laterality: Left;  ULNAR NERVE DECOMPRESSION/TRANSPOSITION   Social History:  reports that he has never smoked. He has never used smokeless tobacco. He reports that he does not drink alcohol or use drugs.  Family / Support Systems Marital Status: Married Patient Roles: Spouse, Parent Spouse/Significant Other: wife, Kadon Rauch @ [phone] Children: They have two adult children living in [place] Anticipated Caregiver: Hassan Rowan Ability/Limitations of Caregiver: min assist Family Dynamics: I am familiar wtih pt and wife from prior CIR stay and wife  remains very supportive and involved.  Social History Preferred language: English Religion: [employer] Cultural Background: NA Education: college Read: Yes Write: Yes Employment Status: Retired Public relations account executive Issues: None Guardian/Conservator: None - per MD, pt is capable of making decisions on his own behalf   Abuse/Neglect Abuse/Neglect Assessment Can Be Completed: Yes Physical Abuse: Denies Verbal Abuse: Denies Sexual Abuse: Denies Exploitation of patient/patient's resources: Denies Self-Neglect: Denies  Emotional Status Pt's affect, behavior and adjustment status: Pt sitting up in w/c and has many pain c/o.  Frustrated with his limitations and feels he is "so much worse off now then when I lost my leg."  He is tearful at times and fearful he "won't ever get back to where I was."  Given  depressed mood, will refer for neuropsychology for additional support. Recent Psychosocial Issues: amputation and CIR stay in Jan 2020 Psychiatric History: None Substance Abuse History: None  Patient / Family Perceptions, Expectations & Goals Pt/Family understanding of illness & functional limitations: Pt and wife general understanding of surgery performed and of current functional limitations/ need for CIR. Premorbid pt/family roles/activities: Pt was needing some assist from wife with mobility and ADLs PTA Anticipated changes in roles/activities/participation: Wife may need to increase level of physical assistance with this d/c. Pt/family expectations/goals: Both feel pt was "discharged too early" with prior CIR stay.  Wanting to stay "until I can do a lot more for myself."  [employer]: Other (Comment)(prosthetics) Premorbid Home Care/DME Agencies: Other (Comment)([facility]) Transportation available at discharge: yes Resource referrals recommended: Neuropsychology, Support group (specify)  Discharge Planning Living Arrangements: Spouse/significant  other Support Systems: Spouse/significant other Type of Residence: Private residence Insurance Resources: [employer] Financial Resources: [employer] Referred: No Living Expenses: Own Money Management: Spouse Does the patient have any problems obtaining your medications?: No Home Management: mostly wife Patient/Family Preliminary Plans: Pt to return home with wife providing primary support. Social Work Anticipated Follow Up Needs: HH/OP Expected length of stay: 7-10 days  Clinical Impression Elderly, frail appearing gentleman who is familiar to this SW from prior CIR stay in January.  He is frustrated with new functional limitations and pain.  Wife remains very involved and able to resume assistance upon d/c.  Will follow for support and d/c planning.  Jahmere Bramel 11/14/2018, 1:02 PM

## 2018-11-14 NOTE — Progress Notes (Signed)
[facility] PHYSICAL MEDICINE & REHABILITATION PROGRESS NOTE   Subjective/Complaints: Patient seen sitting up in bed this morning.  He states he slept well overnight due to neuropathic pain.  ROS- +Neuropathic pain.  Denies CP, SOB, N/V/D   Objective:   No results found. Recent Labs    11/14/18 0719  WBC 9.4  HGB 8.6*  HCT 25.5*  PLT 246   Recent Labs    11/14/18 0719  NA 138  K 3.9  CL 102  CO2 28  GLUCOSE 173*  BUN 24*  CREATININE 1.38*  CALCIUM 8.4*    Intake/Output Summary (Last 24 hours) at 11/14/2018 1325 Last data filed at 11/14/2018 1221 Gross per 24 hour  Intake 490 ml  Output 900 ml  Net -410 ml     Physical Exam: Vital Signs Blood pressure (!) 144/52, pulse (!) 57, temperature 98.4 F (36.9 C), temperature source Oral, resp. rate 18, height 5\' 6"  (1.676 m), weight 70.9 kg, SpO2 99 %. Constitutional: No distress . Vital signs reviewed. HENT: Normocephalic.  Atraumatic. Eyes: EOMI. No discharge. Cardiovascular: No JVD. Respiratory: Normal effort.  No stridor. GI: Non-distended. Skin: Surgical incision with dressing C/D/I Psych: Normal mood.  Normal behavior. Musc: No edema in extremities.  No tenderness in extremities. Right AKA  Neurological:Alert Motor: Bilateral upper extremities: 4/5 proximal distal Right lower extremity: Hip flexion 5/5 Left lower extremity: Hip flexion, knee extension 4-/5 (pain inhibition), ankle dorsiflexion 4+/5  Assessment/Plan: 1. Functional deficits secondary to cervical stenosis/HNP/severe myelopathy which require 3+ hours per day of interdisciplinary therapy in a comprehensive inpatient rehab setting.  Physiatrist is providing close team supervision and 24 hour management of active medical problems listed below.  Physiatrist and rehab team continue to assess barriers to discharge/monitor patient progress toward functional and medical goals  Care Tool:  Bathing    Body parts bathed by patient: Right arm, Left  arm, Chest, Abdomen, Front perineal area, Buttocks, Right upper leg, Face, Left lower leg, Left upper leg     Body parts n/a: Right lower leg   Bathing assist Assist Level: Minimal Assistance - Patient > 75%     Upper Body Dressing/Undressing Upper body dressing   What is the patient wearing?: Pull over shirt    Upper body assist Assist Level: Minimal Assistance - Patient > 75%    Lower Body Dressing/Undressing Lower body dressing      What is the patient wearing?: Pants     Lower body assist Assist for lower body dressing: Minimal Assistance - Patient > 75%     Toileting Toileting Toileting Activity did not occur (Clothing management and hygiene only): N/A (no void or bm)(used urinal, no BM)  Toileting assist Assist for toileting: Minimal Assistance - Patient > 75%     Transfers Chair/bed transfer  Transfers assist     Chair/bed transfer assist level: Moderate Assistance - Patient 50 - 74% Chair/bed transfer assistive device: Armrests   Locomotion Ambulation   Ambulation assist   Ambulation activity did not occur: Safety/medical concerns          Walk 10 feet activity   Assist  Walk 10 feet activity did not occur: Safety/medical concerns        Walk 50 feet activity   Assist Walk 50 feet with 2 turns activity did not occur: Safety/medical concerns         Walk 150 feet activity   Assist Walk 150 feet activity did not occur: Safety/medical concerns  Walk 10 feet on uneven surface  activity   Assist Walk 10 feet on uneven surfaces activity did not occur: Safety/medical concerns         Wheelchair     Assist Will patient use wheelchair at discharge?: Yes Type of Wheelchair: Manual    Wheelchair assist level: Supervision/Verbal cueing Max wheelchair distance: 39'    Wheelchair 50 feet with 2 turns activity    Assist        Assist Level: Supervision/Verbal cueing   Wheelchair 150 feet activity      Assist  Wheelchair 150 feet activity did not occur: Safety/medical concerns       Blood pressure (!) 144/52, pulse (!) 57, temperature 98.4 F (36.9 C), temperature source Oral, resp. rate 18, height 5\' 6"  (1.676 m), weight 70.9 kg, SpO2 99 %.  Medical Problem List and Plan: 1.Decreased functional ability with upper extremity weaknesssecondary to cervical stenosis/HNP/severe myelopathy. Status post C3-4 anterior cervical corpectomy andAnterior cervical arthrodesis C3-4 4-5 and 5-6 11/09/2018. Continue CIR -Soft collar as directed  Plan to wean decadron  2. Antithrombotics: -DVT/anticoagulation:Chronic Eliquis on hold x7 days from day of surgery and then resume (~10/7) -antiplatelet therapy: N/A 3. Pain Management:Oxycodone and Robaxin as needed  Added Lidocaine cream for hands B/L QID   Gabapentin increased to 200 3 times daily on 10/5 4. Mood:Provide emotional support -antipsychotic agents: N/A 5. Neuropsych: This patientiscapable of making decisions on hisown behalf. 6. Skin/Wound Care:Routine skin checks 7. Fluids/Electrolytes/Nutrition:Routine in and outs 8. Acute on chronic anemia. Follow-up CBC 9.Chronic myeloid leukemia. Follow-up outpatient 10. CKD stage III. Creatinine baseline 1.63-1.79  Creatinine 1.38 on 10/5  Continue to monitor 11. 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 Patient did receive inpatient rehab services 13. Myasthenia gravis. Continue Mestinon and low-dose chronic prednisone 14. Hypertension. Lasix 40 mg daily, Imdur 30 mg daily.   Slightly elevated on 10/5, monitor for trend 15.  Klebsiella UTI   Cipro started on 10/5 16. Neurogenic bowel:  Increase bowel meds as needed 17.  Steroid-induced hyperglycemia  Continue to monitor with steroid wean 18.  Hypoalbuminemia  Supplement initiated on 10/5 19.  Acute blood loss anemia  Hemoglobin 8.6 on 10/5,  labs ordered for tomorrow  Continue to monitor  LOS: 3 days A FACE TO FACE EVALUATION WAS PERFORMED   Lorie Phenix 11/14/2018, 1:25 PM

## 2018-11-14 NOTE — IPOC Note (Signed)
Individualized overall Plan of Care (IPOC) Patient Details Name: [name] MRN: [id number] DOB: 08-Nov-1940  Admitting Diagnosis: Cervical myelopathy [facility])  Hospital Problems: Principal Problem:   Cervical myelopathy ([place]) Active Problems:   Diabetes mellitus type II, controlled ([place])   Myasthenia gravis ([place])   Cervical spinal cord compression ([place])   CKD (chronic kidney disease), stage III   Unilateral AKA, right (HCC)   Hypoalbuminemia due to protein-calorie malnutrition ([place])   Steroid-induced hyperglycemia   UTI due to Klebsiella species     Functional Problem List: Nursing Bowel, Bladder, Safety, Medication Management, Pain  PT Balance, Endurance, Motor, Pain, Safety, Sensory  OT Balance, Sensory, Edema, Endurance, Motor, Pain  SLP    TR         Basic ADL's: OT Grooming, Eating, Bathing, Dressing, Toileting     Advanced  ADL's: OT       Transfers: PT Bed Mobility, Bed to Chair, Car, Furniture, Floor  OT Toilet, Metallurgist: PT Ambulation, Emergency planning/management officer, Stairs     Additional Impairments: OT Fuctional Use of Upper Extremity  SLP        TR      Anticipated Outcomes Item Anticipated Outcome  Self Feeding set up assist  Swallowing      Basic self-care  (S)  Toileting  (S)   Bathroom Transfers (S)  Bowel/Bladder  manage bowel and bladder with min assist  Transfers  Supervision  Locomotion  Supervision at w/c level  Communication     Cognition     Pain  pain at or below level 5  Safety/Judgment  maintain safety with min assist   Therapy Plan: PT Intensity: Minimum of 1-2 x/day ,45 to 90 minutes PT Frequency: 5 out of 7 days PT Duration Estimated Length of Stay: 7-10 days OT Intensity: Minimum of 1-2 x/day, 45 to 90 minutes OT Frequency: 5 out of 7 days OT Duration/Estimated Length of Stay: 7-10 days      Team Interventions: Nursing Interventions Patient/Family Education, Bowel Management, Bladder Management,  Medication Management, Disease Management/Prevention, Pain Management, Discharge Planning  PT interventions Balance/vestibular training, Community reintegration, Discharge planning, Disease management/prevention, DME/adaptive equipment instruction, Functional mobility training, Neuromuscular re-education, Pain management, Patient/family education, Psychosocial support, Splinting/orthotics, Therapeutic Activities, Therapeutic Exercise, UE/LE Strength taining/ROM, UE/LE Coordination activities, Wheelchair propulsion/positioning  OT Interventions Discharge planning, Balance/vestibular training, Pain management, Self Care/advanced ADL retraining, Therapeutic Activities, UE/LE Coordination activities, Therapeutic Exercise, Skin care/wound managment, Patient/family education, Functional mobility training, Disease mangement/prevention, Academic librarian, Engineer, drilling, Neuromuscular re-education, Splinting/orthotics, Wheelchair propulsion/positioning, UE/LE Strength taining/ROM, Psychosocial support  SLP Interventions    TR Interventions    SW/CM Interventions Discharge Planning, Psychosocial Support, Patient/Family Education   Barriers to Discharge MD  Medical stability  Nursing      PT Medical stability    OT      SLP      SW       Team Discharge Planning: Destination: PT-Home ,OT- Home , SLP-  Projected Follow-up: PT-Home health PT, OT-  Home health OT, SLP-  Projected Equipment Needs: PT-None recommended by PT(pt already owns all necessary equipment), OT- None recommended by OT, SLP-  Equipment Details: PT- , OT-  Patient/family involved in discharge planning: PT- Patient,  OT-Patient, SLP-   MD ELOS: 7-10 days. Medical Rehab Prognosis:  Good Assessment: 78 year old right-handed male history of CAD, ,CML,myasthenia gravis maintained on Mestinon as well as prednisone CKD stage III, right AKA 2019 and received inpatient rehab services 02/10/2018 to  1/11/2020and  prosthesis by [employer] prosthetics, and history of pulmonary emboli maintained on Eliquis,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 Presented 11/09/2018 with complaints of neck pain, numbness bilateral upper extremities and weakness that is progressed over the last several months. X-rays and imaging revealed herniated nucleus pulposuswith spondylosis/stenosis at C3-4 C-4-6 with severe myelopathy. Underwent decompressive anterior cervical discectomy C3-4 with anterior cervical corpectomy anterior cervical arthrodesis C3-4, 4-5 and C5-6 11/09/2018 per Dr. Sherley Bounds. Hospital course pain management. Soft cervical collar as directed. Acute blood loss anemia. Decadron wean initiated. Chronic Eliquis to be held 7 days from day of surgery and then could be resumed. Patient with resulting functional deficits with mobility, transfers, self-care.  Will set goals for Supervision with PT/OT.  Due to the current state of emergency, patients may not be receiving their 3-hours of Medicare-mandated therapy.  See Team Conference Notes for weekly updates to the plan of care

## 2018-11-15 ENCOUNTER — Inpatient Hospital Stay (HOSPITAL_COMMUNITY): Payer: No Typology Code available for payment source | Admitting: Physical Therapy

## 2018-11-15 ENCOUNTER — Inpatient Hospital Stay (HOSPITAL_COMMUNITY): Payer: No Typology Code available for payment source

## 2018-11-15 ENCOUNTER — Inpatient Hospital Stay (HOSPITAL_COMMUNITY): Payer: No Typology Code available for payment source | Admitting: Occupational Therapy

## 2018-11-15 DIAGNOSIS — M792 Neuralgia and neuritis, unspecified: Secondary | ICD-10-CM

## 2018-11-15 DIAGNOSIS — D649 Anemia, unspecified: Secondary | ICD-10-CM

## 2018-11-15 DIAGNOSIS — R0989 Other specified symptoms and signs involving the circulatory and respiratory systems: Secondary | ICD-10-CM

## 2018-11-15 LAB — CBC WITH DIFFERENTIAL/PLATELET
Abs Immature Granulocytes: 0.12 10*3/uL — ABNORMAL HIGH (ref 0.00–0.07)
Basophils Absolute: 0 10*3/uL (ref 0.0–0.1)
Basophils Relative: 0 %
Eosinophils Absolute: 0 10*3/uL (ref 0.0–0.5)
Eosinophils Relative: 0 %
HCT: 24.6 % — ABNORMAL LOW (ref 39.0–52.0)
Hemoglobin: 8 g/dL — ABNORMAL LOW (ref 13.0–17.0)
Immature Granulocytes: 1 %
Lymphocytes Relative: 9 %
Lymphs Abs: 0.9 10*3/uL (ref 0.7–4.0)
MCH: 30.3 pg (ref 26.0–34.0)
MCHC: 32.5 g/dL (ref 30.0–36.0)
MCV: 93.2 fL (ref 80.0–100.0)
Monocytes Absolute: 1 10*3/uL (ref 0.1–1.0)
Monocytes Relative: 11 %
Neutro Abs: 7.5 10*3/uL (ref 1.7–7.7)
Neutrophils Relative %: 79 %
Platelets: 224 10*3/uL (ref 150–400)
RBC: 2.64 MIL/uL — ABNORMAL LOW (ref 4.22–5.81)
RDW: 16.7 % — ABNORMAL HIGH (ref 11.5–15.5)
WBC: 9.6 10*3/uL (ref 4.0–10.5)
nRBC: 0 % (ref 0.0–0.2)

## 2018-11-15 MED ORDER — DEXAMETHASONE 2 MG PO TABS
2.0000 mg | ORAL_TABLET | Freq: Three times a day (TID) | ORAL | Status: DC
  Administered 2018-11-15 – 2018-11-20 (×14): 2 mg via ORAL
  Filled 2018-11-15 (×14): qty 1

## 2018-11-15 MED ORDER — DEXAMETHASONE SODIUM PHOSPHATE 4 MG/ML IJ SOLN
2.0000 mg | Freq: Three times a day (TID) | INTRAMUSCULAR | Status: DC
  Filled 2018-11-15 (×2): qty 1

## 2018-11-15 NOTE — Progress Notes (Signed)
Physical Therapy Session Note  Patient Details  Name: [name] MRN: [id number] Date of Birth: 02-23-1940  Today's Date: 11/15/2018 PT Individual Time: 1450-1530 PT Individual Time Calculation (min): 40 min   Short Term Goals: Week 1:  PT Short Term Goal 1 (Week 1): =LTG due to ELOS  Skilled Therapeutic Interventions/Progress Updates:   Pt in w/c and agreeable to therapy, pain 5/10 in L medial groin. Discussed d/c plan w/ both wife and pt as he now has prosthesis. Discussed that it would be most beneficial for him to stay another week vs going home on Saturday when son can be present for car transfer. Discussed if he would have someone to assist wife w/ car transfer next week as son can only come on Saturdays, both stated a church member could physically assist wife w/ car transfer if son cannot be present. Made treatment team aware and both in agreement that he would need until next week to reach LTGs. Discussed performing LLE strengthening/ROM exercises outside of therapy. Performed exercises below and pt performed correctly and w/ minimal increase in pain. Ice applied to L medial thigh for pain relief. Ended session in w/c, all needs in reach.   LLE strengthening: -LAQs 3x12 -heel slides 3x15 -heel raises 3x15 -adduction squeezes 3x5, needed max tactile/verbal cues at first to isolate adductors, pt tended to compensate w/ hip flexor activation to adduct LLE  Therapy Documentation Precautions:  Precautions Precautions: Fall, Cervical Precaution Comments: R AKA Required Braces or Orthoses: Cervical Brace Cervical Brace: Soft collar(reviewed cervical precautions) Restrictions Weight Bearing Restrictions: No Vital Signs:   Therapy/Group: Individual Therapy  [name] Clent Demark 11/15/2018, 7:22 PM

## 2018-11-15 NOTE — Progress Notes (Signed)
Occupational Therapy Session Note  Patient Details  Name: [name] MRN: [id number] Date of Birth: 1940-12-03  Today's Date: 11/15/2018 OT Individual Time: 0730-0800 OT Individual Time Calculation (min): 30 min    Short Term Goals: Week 1:  OT Short Term Goal 1 (Week 1): Pt will manage 1 container during meals with min A OT Short Term Goal 2 (Week 1): Pt will don pants with lateral leans with mod A OT Short Term Goal 3 (Week 1): Pt will complete BSC transfer with CGA  Skilled Therapeutic Interventions/Progress Updates:    Pt received supine with c/o pain 4/10 in his L upper leg. Pt feeling down this morning and stating multiple deficits. Pt encouraged to reflect on his strengths and positives of progress. Pt completed bed mobility to EOB with (S). Min A required to don pants sitting EOB with lateral leans. Pt completed lateral scoot to w/c with stabilization on equipment only, no physical assist. Pt sat at sink and completed oral care and grooming tasks with set up assist for managing containers. Pt's L 4th digit was re-buddy tapped for trigger finger management. Pt able to make a composite fist with no snapping of the tendon or rigid flexion! Scar mobilization and tissue massage completed to pt's L hand over incision with heat. Pt reported pain relief and noticeable reduction in edema. Pt was then guided through making a list of strengths/positive advancements in therapy, along with a positive affirmation to repeat d/t pt's continued negative self remarks. Pt thankful for this intervention. Pt was left sitting up with all needs met, chair alarm set.   Therapy Documentation Precautions:  Precautions Precautions: Fall, Cervical Precaution Comments: R AKA Required Braces or Orthoses: Cervical Brace Cervical Brace: Soft collar(reviewed cervical precautions) Restrictions Weight Bearing Restrictions: No   Therapy/Group: Individual Therapy  Curtis Sites 11/15/2018, 7:46 AM

## 2018-11-15 NOTE — Progress Notes (Signed)
[facility] PHYSICAL MEDICINE & REHABILITATION PROGRESS NOTE   Subjective/Complaints: Patient seen sitting up in a chair this morning.  He states he slept fairly overnight.  He notes some improvement with gabapentin increased yesterday.  ROS- +Neuropathic pain.  Denies CP, SOB, N/V/D   Objective:   No results found. Recent Labs    11/14/18 0719 11/15/18 0514  WBC 9.4 9.6  HGB 8.6* 8.0*  HCT 25.5* 24.6*  PLT 246 224   Recent Labs    11/14/18 0719  NA 138  K 3.9  CL 102  CO2 28  GLUCOSE 173*  BUN 24*  CREATININE 1.38*  CALCIUM 8.4*    Intake/Output Summary (Last 24 hours) at 11/15/2018 1231 Last data filed at 11/15/2018 1224 Gross per 24 hour  Intake 360 ml  Output 1050 ml  Net -690 ml     Physical Exam: Vital Signs Blood pressure (!) 121/55, pulse 62, temperature 98 F (36.7 C), temperature source Oral, resp. rate 16, height 5\' 6"  (1.676 m), weight 70.9 kg, SpO2 98 %. Constitutional: No distress . Vital signs reviewed. HENT: Normocephalic.  Atraumatic. Neck: Soft collar in place. Eyes: EOMI. No discharge. Cardiovascular: No JVD. Respiratory: Normal effort.  No stridor. GI: Non-distended. Skin: Surgical incision C/D/I. Psych: Normal mood.  Normal behavior. Musc: Right AKA Neurological:Alert Motor: Bilateral upper extremities: 4/5 proximal distal, unchanged Left lower extremity: Hip flexion, knee extension 4-/5 (pain inhibition), ankle dorsiflexion 4+/5, unchanged  Assessment/Plan: 1. Functional deficits secondary to cervical stenosis/HNP/severe myelopathy which require 3+ hours per day of interdisciplinary therapy in a comprehensive inpatient rehab setting.  Physiatrist is providing close team supervision and 24 hour management of active medical problems listed below.  Physiatrist and rehab team continue to assess barriers to discharge/monitor patient progress toward functional and medical goals  Care Tool:  Bathing    Body parts bathed by patient:  Right arm, Left arm, Chest, Abdomen, Front perineal area, Buttocks, Right upper leg, Face, Left lower leg, Left upper leg     Body parts n/a: Right lower leg   Bathing assist Assist Level: Minimal Assistance - Patient > 75%     Upper Body Dressing/Undressing Upper body dressing   What is the patient wearing?: Pull over shirt    Upper body assist Assist Level: Minimal Assistance - Patient > 75%    Lower Body Dressing/Undressing Lower body dressing      What is the patient wearing?: Pants     Lower body assist Assist for lower body dressing: Minimal Assistance - Patient > 75%     Toileting Toileting Toileting Activity did not occur (Clothing management and hygiene only): N/A (no void or bm)(used urinal, no BM)  Toileting assist Assist for toileting: Minimal Assistance - Patient > 75%     Transfers Chair/bed transfer  Transfers assist     Chair/bed transfer assist level: Moderate Assistance - Patient 50 - 74% Chair/bed transfer assistive device: Armrests   Locomotion Ambulation   Ambulation assist   Ambulation activity did not occur: Safety/medical concerns          Walk 10 feet activity   Assist  Walk 10 feet activity did not occur: Safety/medical concerns        Walk 50 feet activity   Assist Walk 50 feet with 2 turns activity did not occur: Safety/medical concerns         Walk 150 feet activity   Assist Walk 150 feet activity did not occur: Safety/medical concerns  Walk 10 feet on uneven surface  activity   Assist Walk 10 feet on uneven surfaces activity did not occur: Safety/medical concerns         Wheelchair     Assist Will patient use wheelchair at discharge?: Yes Type of Wheelchair: Manual    Wheelchair assist level: Supervision/Verbal cueing Max wheelchair distance: 57'    Wheelchair 50 feet with 2 turns activity    Assist        Assist Level: Supervision/Verbal cueing   Wheelchair 150 feet  activity     Assist  Wheelchair 150 feet activity did not occur: Safety/medical concerns       Blood pressure (!) 121/55, pulse 62, temperature 98 F (36.7 C), temperature source Oral, resp. rate 16, height 5\' 6"  (1.676 m), weight 70.9 kg, SpO2 98 %.  Medical Problem List and Plan: 1.Decreased functional ability with upper extremity weaknesssecondary to cervical stenosis/HNP/severe myelopathy. Status post C3-4 anterior cervical corpectomy andAnterior cervical arthrodesis C3-4 4-5 and 5-6 11/09/2018. Continue CIR -Soft collar as directed  Decadron wean to 2 mg every 8 on 10/7  Team conference today to discuss current and goals and coordination of care, home and environmental barriers, and discharge planning with nursing, case manager, and therapies.  2. Antithrombotics: -DVT/anticoagulation:Chronic Eliquis on hold x7 days from day of surgery and then resume (~10/7) -antiplatelet therapy: N/A 3. Pain Management:Oxycodone and Robaxin as needed  Added Lidocaine cream for hands B/L QID   Gabapentin increased to 200 3 times daily on 10/5 with some benefit 4. Mood:Provide emotional support -antipsychotic agents: N/A 5. Neuropsych: This patientiscapable of making decisions on hisown behalf. 6. Skin/Wound Care:Routine skin checks 7. Fluids/Electrolytes/Nutrition:Routine in and outs 8. Acute on chronic anemia.   Hemoglobin 8.0 on 10/6, labs ordered for tomorrow  Hemoccult ordered 9.Chronic myeloid leukemia. Follow-up outpatient 10. CKD stage III. Creatinine baseline 1.63-1.79  Creatinine 1.38 on 10/5  Continue to monitor 11. 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 Patient did receive inpatient rehab services 13. Myasthenia gravis. Continue Mestinon and low-dose chronic prednisone 14. Hypertension. Lasix 40 mg daily, Imdur 30 mg daily.   Slightly labile on 10/6 15.  Klebsiella UTI   Cipro  started on 10/5 16. Neurogenic bowel:  Increase bowel meds as needed 17.  Steroid-induced hyperglycemia  Continue to monitor with steroid wean 18.  Hypoalbuminemia  Supplement initiated on 10/5  LOS: 4 days A FACE TO FACE EVALUATION WAS PERFORMED  [name] Lorie Phenix 11/15/2018, 12:31 PM

## 2018-11-15 NOTE — Progress Notes (Signed)
Physical Therapy Session Note  Patient Details  Name: [name] MRN: [id number] Date of Birth: 07-26-1940  Today's Date: 11/15/2018 PT Individual Time: 0930-1045 PT Individual Time Calculation (min): 75 min   Short Term Goals: Week 1:  PT Short Term Goal 1 (Week 1): =LTG due to ELOS  Skilled Therapeutic Interventions/Progress Updates:     Patient in w/c upon PT arrival. Patient alert and agreeable to PT session. Patient reported 5/10 L groin pain during session, RN made aware. PT provided repositioning, rest breaks, and distraction as pain interventions throughout session.   Therapeutic Activity: PT removed kineseo tape from patient's L groin and replaced tape with Y shape with anchor at the medial condyle of the femur and tails with 50% pull over his hip adductors. Patient in sitting with slight hip abduction for placement. Bed Mobility: Patient performed supine to sit with min A and sit to supine with supervision on a mat table. Provided verbal cues for rolling to push up with his elbow, then hand to come to sitting. PT donned his prosthesis on his L residual limb with total A for donning his liner, 1 sock, and the socket with patient lying in supine on the mat table. Patient able to shift into the socket completely in standing, as described below.  Transfers: Patient performed sit to/from stand x1 with RW without his L prosthesis with mod A and x3 with mod-min A with his prosthesis donned. Provided verbal cues for setting his prosthetic limb with the knee flexed and heel forward to stand, then shift the knee back into extension once in standing. Patient stood x4 min x1 with B UE support and min A-CGA for balance.  He performed stand pivots x2 using the RW with mod-max A x1 and min-mod A x1. Required assistance with management of the RW during pivot and cues for placing his prosthetic limb forward to maintain knee extension during pivot.   Gait Training:  Patient ambulated 6 feet and 12  feet using RW with his L prosthesis donned with min A. Ambulated with step-to gait pattern using prosthetic gait for facilitation knee extension when stepping by placing heel on the floor and pulling back with his L hip prior to stance on his prosthesis. Provided verbal cues for sequencing and technique for prosthetic gait.  Wheelchair Mobility:  Patient propelled wheelchair 100 feet and 75 feet with supervision using B UEs, limited due to fatigue. Provided verbal cues for equal propulsion with B UEs for steering forward, even if he needed to push less with his R to meet the strength of his L UE.   Patient in w/c at end of session with breaks locked, chair alarm set, and all needs within reach.    Therapy Documentation Precautions:  Precautions Precautions: Fall, Cervical Precaution Comments: R AKA Required Braces or Orthoses: Cervical Brace Cervical Brace: Soft collar(reviewed cervical precautions) Restrictions Weight Bearing Restrictions: No    Therapy/Group: Individual Therapy  Sharay Bellissimo L Jacqualin Shirkey PT, DPT  11/15/2018, 3:38 PM

## 2018-11-15 NOTE — Progress Notes (Signed)
Occupational Therapy Session Note  Patient Details  Name: [name] MRN: [id number] Date of Birth: [birth year]  Today's Date: 11/15/2018 OT Individual Time: 1301-1400 OT Individual Time Calculation (min): 59 min   Short Term Goals: Week 1:  OT Short Term Goal 1 (Week 1): Pt will manage 1 container during meals with min A OT Short Term Goal 2 (Week 1): Pt will don pants with lateral leans with mod A OT Short Term Goal 3 (Week 1): Pt will complete BSC transfer with CGA  Skilled Therapeutic Interventions/Progress Updates:    Pt greeted seated in wc and agreeable to OT treatment session. Pt reports he has been able to keep R prosthesis on since his earlier session this morning. Worked on [employer] with wc propulsion to therapy gym. Pt needed 3 rest breaks to make it to the gym 2/2 UE weakness and fatigue. Pt reported he has not transferred with his R LE prosthesis on. Pt needed multiple trials to try to power up for squat-pivot 2/2 transfer being different and feeling like R LE was "in the way." OT removed arm rest and had pt complete lateral scooting transfer with min A to mat. UB there-ex seated on mat using 1 lb dowel rod. Pt maintained cervical precautions throughout there-ex. Modified straight arm raises, chest press, and bicep curls. 4 sets of 15 each. Pt needed rest breaks in-between and min A for form to keep work load in UEs. Pt continues to report pain in L hand and LLE, but tolerated grasping dowel rod today. Extensive discussion on OT goals, dc plan, PLOF, and pt progress. Pt says his goal is to leave Saturday, but wants to stay as long as he needs to, to get stronger. OT issued pt reacher and simulated threading pants using thera-band. Pt able to grasp/release reacher using R hand. Pt returned to room and requested prosthesis be removed. Pt tolerated prosthesis on for 4 hours today. Pt left seated in wc with spouse present, chair alarm on, and needs met .  Therapy  Documentation Precautions:  Precautions Precautions: Fall, Cervical Precaution Comments: R AKA Required Braces or Orthoses: Cervical Brace Cervical Brace: Soft collar(reviewed cervical precautions) Restrictions Weight Bearing Restrictions: No Pain: Pain Assessment Pain Scale: 0-10 Pain Score: 7  Pain Type: Chronic pain Pain Location: Leg Pain Orientation: Left Pain Descriptors / Indicators: Sharp;Moaning;Discomfort;Aching Pain Frequency: Constant Pain Onset: On-going Patients Stated Pain Goal: 0 Pain Intervention(s): Repositionined   Therapy/Group: Individual Therapy  Valma Cava 11/15/2018, 1:59 PM

## 2018-11-16 ENCOUNTER — Inpatient Hospital Stay (HOSPITAL_COMMUNITY): Payer: No Typology Code available for payment source | Admitting: Physical Therapy

## 2018-11-16 ENCOUNTER — Inpatient Hospital Stay (HOSPITAL_COMMUNITY): Payer: No Typology Code available for payment source

## 2018-11-16 DIAGNOSIS — C9210 Chronic myeloid leukemia, BCR/ABL-positive, not having achieved remission: Secondary | ICD-10-CM

## 2018-11-16 DIAGNOSIS — Z86711 Personal history of pulmonary embolism: Secondary | ICD-10-CM

## 2018-11-16 LAB — CBC WITH DIFFERENTIAL/PLATELET
Abs Immature Granulocytes: 0.15 10*3/uL — ABNORMAL HIGH (ref 0.00–0.07)
Basophils Absolute: 0 10*3/uL (ref 0.0–0.1)
Basophils Relative: 0 %
Eosinophils Absolute: 0 10*3/uL (ref 0.0–0.5)
Eosinophils Relative: 0 %
HCT: 24.3 % — ABNORMAL LOW (ref 39.0–52.0)
Hemoglobin: 8.2 g/dL — ABNORMAL LOW (ref 13.0–17.0)
Immature Granulocytes: 1 %
Lymphocytes Relative: 7 %
Lymphs Abs: 0.7 10*3/uL (ref 0.7–4.0)
MCH: 31.1 pg (ref 26.0–34.0)
MCHC: 33.7 g/dL (ref 30.0–36.0)
MCV: 92 fL (ref 80.0–100.0)
Monocytes Absolute: 1 10*3/uL (ref 0.1–1.0)
Monocytes Relative: 9 %
Neutro Abs: 8.7 10*3/uL — ABNORMAL HIGH (ref 1.7–7.7)
Neutrophils Relative %: 83 %
Platelets: 210 10*3/uL (ref 150–400)
RBC: 2.64 MIL/uL — ABNORMAL LOW (ref 4.22–5.81)
RDW: 16.4 % — ABNORMAL HIGH (ref 11.5–15.5)
WBC: 10.5 10*3/uL (ref 4.0–10.5)
nRBC: 0 % (ref 0.0–0.2)

## 2018-11-16 MED ORDER — APIXABAN 2.5 MG PO TABS
2.5000 mg | ORAL_TABLET | Freq: Two times a day (BID) | ORAL | Status: DC
  Administered 2018-11-16 – 2018-11-23 (×14): 2.5 mg via ORAL
  Filled 2018-11-16 (×14): qty 1

## 2018-11-16 MED ORDER — CIPROFLOXACIN HCL 500 MG PO TABS
500.0000 mg | ORAL_TABLET | Freq: Two times a day (BID) | ORAL | Status: AC
  Administered 2018-11-16 – 2018-11-20 (×9): 500 mg via ORAL
  Filled 2018-11-16 (×9): qty 1

## 2018-11-16 MED ORDER — IMATINIB MESYLATE 100 MG PO TABS
300.0000 mg | ORAL_TABLET | Freq: Every day | ORAL | Status: DC
  Administered 2018-11-17 – 2018-11-23 (×7): 300 mg via ORAL
  Filled 2018-11-16 (×9): qty 3

## 2018-11-16 NOTE — Discharge Instructions (Addendum)
Inpatient Rehab Discharge Instructions  [facility] Discharge date and time: No discharge date for patient encounter.   Activities/Precautions/ Functional Status: Activity: Soft cervical collar as directed Diet: regular diet Wound Care: keep wound clean and dry Functional status:  ___ No restrictions     ___ Walk up steps independently ___ 24/7 supervision/assistance   ___ Walk up steps with assistance ___ Intermittent supervision/assistance  ___ Bathe/dress independently ___ Walk with walker     _x__ Bathe/dress with assistance ___ Walk Independently    ___ Shower independently ___ Walk with assistance    ___ Shower with assistance ___ No alcohol     ___ Return to work/school ________    COMMUNITY REFERRALS UPON DISCHARGE:    Home Health:   PT     OT                     Agency:  [facility]      Phone: [phone]        Special Instructions: No driving smoking or alcohol   My questions have been answered and I understand these instructions. I will adhere to these goals and the provided educational materials after my discharge from the hospital.  Patient/Caregiver Signature _______________________________ Date __________  Clinician Signature _______________________________________ Date __________  Please bring this form and your medication list with you to all your follow-up doctor's appointments.    Information on my medicine - ELIQUIS (apixaban)   Why was Eliquis prescribed for you? Eliquis was prescribed for you to reduce the risk of forming blood clots that can cause a stroke if you have a medical condition called atrial fibrillation (a type of irregular heartbeat) OR to reduce the risk of a blood clots forming after orthopedic surgery.  What do You need to know about Eliquis ? Take your Eliquis TWICE DAILY - one tablet in the morning and one tablet in the evening with or without food.  It would be best to take the doses about the same time each  day.  If you have difficulty swallowing the tablet whole please discuss with your pharmacist how to take the medication safely.  Take Eliquis exactly as prescribed by your doctor and DO NOT stop taking Eliquis without talking to the doctor who prescribed the medication.  Stopping may increase your risk of developing a new clot or stroke.  Refill your prescription before you run out.  After discharge, you should have regular check-up appointments with your healthcare provider that is prescribing your Eliquis.  In the future your dose may need to be changed if your kidney function or weight changes by a significant amount or as you get older.  What do you do if you miss a dose? If you miss a dose, take it as soon as you remember on the same day and resume taking twice daily.  Do not take more than one dose of ELIQUIS at the same time.  Important Safety Information A possible side effect of Eliquis is bleeding. You should call your healthcare provider right away if you experience any of the following: ? Bleeding from an injury or your nose that does not stop. ? Unusual colored urine (red or dark brown) or unusual colored stools (red or black). ? Unusual bruising for unknown reasons. ? A serious fall or if you hit your head (even if there is no bleeding).  Some medicines may interact with Eliquis and might increase your risk of bleeding or clotting while on Eliquis.  To help avoid this, consult your healthcare provider or pharmacist prior to using any new prescription or non-prescription medications, including herbals, vitamins, non-steroidal anti-inflammatory drugs (NSAIDs) and supplements.  This website has more information on Eliquis (apixaban): [email or web address].

## 2018-11-16 NOTE — Patient Care Conference (Signed)
Inpatient RehabilitationTeam Conference and Plan of Care Update Date: 11/15/2018   Time: 10:45 AM    Patient Name: [name]      Medical Record Number: [id number]  Date of Birth: 1941-01-06 Sex: Male         Room/Bed: 4W07C/4W07C-01 Payor Info: Payor: [employer] / Plan: [place] / Product Type: *No Product type* /    Admit Date/Time:  11/11/2018  3:49 PM  Primary Diagnosis:  Cervical myelopathy ([place])  Patient Active Problem List   Diagnosis Date Noted  . Chronic myeloid leukemia ([place])   . Labile blood pressure   . Acute on chronic anemia   . Neuropathic pain   . Hypoalbuminemia due to protein-calorie malnutrition ([place])   . Steroid-induced hyperglycemia   . UTI due to Klebsiella species   . Cervical myelopathy ([place]) 11/11/2018  . S/P cervical spinal fusion 11/09/2018  . Neuropathic pain of right ankle 06/27/2018  . Insomnia due to medical condition 04/13/2018  . Atherosclerosis of native coronary artery of native heart without angina pectoris   . Hypokalemia   . Stage 3 chronic kidney disease   . 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 Acute blood loss anemia 02/08/2018  . Post-operative pain   . Unilateral AKA, right ([place])   . Ischemic foot 02/05/2018  . Ischemia of right lower extremity   . Pressure injury of skin 12/27/2017  . Arterial occlusive disease   . Postoperative pain   . CKD (chronic kidney disease), stage III   . History of CVA (cerebrovascular accident)   . 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 Septic arthritis of left sternoclavicular joint ([place]) 11/03/2017  . Cervical spinal cord compression ([place]) 10/30/2017  . Nasal abscess 09/25/2017  . Immunocompromised ([place]) 09/25/2017  . Acute on chronic blood loss anemia 08/05/2017  . Transient ischemic attack (TIA) 07/23/2017  . Subcortical infarction ([place])   . Right hemiparesis ([place])   . Ataxia 07/21/2017  . TIA (transient ischemic attack)   . Wheezing 06/17/2017  .  Bursitis of left elbow 05/01/2017  . Dystonia 12/29/2016  . PAD (peripheral artery disease) ([place]) 11/18/2016  . Osteoporosis 08/28/2016  . Paronychia of great toe, left 08/10/2016  . Carotid arterial disease ([place]) 07/09/2016  . Hypocalcemia 07/09/2016  . Diplopia 07/02/2016  . S/P lumbar laminectomy 03/11/2016  . Radicular pain of left lower extremity 02/21/2016  . Cough 01/10/2016  . 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 Myasthenia gravis ([place])   . Diabetes mellitus type 2 in nonobese (HCC)   . Ataxia, post-stroke   . Gait disturbance, post-stroke   . Proximal leg weakness   . Cervical spondylosis without myelopathy   . Achilles tendinitis of right lower extremity   . Chronic diastolic CHF (congestive heart failure) ([place])   . Gastroesophageal reflux disease without esophagitis   . 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 Cerebellar stroke ([place])   . Ischemic stroke ([place])   . Cerebrovascular accident (CVA) due to thrombosis of left vertebral artery ([place])   . History of pulmonary embolus (PE)   . Chronic anticoagulation   . HLD (hyperlipidemia)   . Neck pain 03/19/2015  . Pain in the chest   . Diabetes mellitus type II, controlled ([place])   . Diabetic gastroparesis ([employer]) 03/07/2015  . Screening for osteoporosis 07/19/2014  . Dyspnea 04/09/2014  . Edema 04/09/2014  . Routine general medical examination at a health care facility 10/23/2013  .  Bilateral carotid bruits 05/24/2013  . Abnormal CT scan, lung 05/24/2013  . Encounter for therapeutic drug monitoring 03/14/2013  . Lymphadenopathy, inguinal 01/11/2013  . AKI (acute kidney injury) ([place]) 10/28/2012  . 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 Myasthenia gravis with exacerbation ([place]) 03/31/2012  . Disease of salivary gland 12/09/2011  . Left facial pain 10/28/2011  . Pulmonary embolism ([place]) 10/25/2011  . 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 GERD with  stricture 09/23/2011  . Long term (current) use of anticoagulants 08/03/2011  . History of pulmonary embolism 07/30/2011  . History of DVT (deep vein thrombosis) 07/30/2011  . Subconjunctival hemorrhage 06/22/2011  . Bilateral conjunctivitis 04/09/2011  . Diabetes mellitus type 2, controlled ([place]) 12/18/2010  . Hearing loss 08/04/2010  . Ptosis of eyelid 12/30/2009  . Essential hypertension 04/24/2009  . HYPERLIPIDEMIA, MIXED 10/05/2008  . HEMORRHOIDS-INTERNAL 07/02/2008  . HEMORRHOIDS-EXTERNAL 07/02/2008  . DIVERTICULOSIS-COLON 07/02/2008  . PERSONAL HX COLONIC POLYPS 07/02/2008  . RASH-NONVESICULAR 02/07/2008  . PNEUMONIA 12/08/2007  . Chronic myeloid leukemia in remission ([place]) 07/07/2007  . CAD (coronary artery disease) 07/07/2007  . NEPHROLITHIASIS, HX OF 07/07/2007    Expected Discharge Date: Expected Discharge Date: 11/23/18  Team Members Present: Physician leading conference: Dr. Courtney Heys Social Worker Present: Lennart Pall, LCSW Nurse Present: Other (comment)(Michelle Ralene Ok, RN) PT Present: Burnard Bunting, PT OT Present: Cherylynn Ridges, OT SLP Present: Weston Anna, SLP PPS Coordinator present : Gunnar Fusi, SLP     Current Status/Progress Goal Weekly Team Focus  Bowel/Bladder   Con of b/b.  Remain con of b/b.  Timed toileting.   Swallow/Nutrition/ Hydration             ADL's   Min A overall  Supervision  Transfer, dc planning, pt.family ed, activity tolerance   Mobility   min-mod assist overall, squat/slide board transfers, sit<>stands to RW, supervision w/c  independent w/c mobility, supervision transfer, min assist car transfer  LLE strength, prosthetic use for transfers, global endurance, discharge planning   Communication             Safety/Cognition/ Behavioral Observations            Pain   c/o of back pain.  To control back pain with prn madications.  To use prn medicaiton as needed to control pain.   Skin   Surgical ins. that has  steristrips.No other skin problems  To remain free of breakdown and free of infection.  Reposition every two hours.    Rehab Goals Patient on target to meet rehab goals: Yes *See Care Plan and progress notes for long and short-term goals.     Barriers to Discharge  Current Status/Progress Possible Resolutions Date Resolved   Nursing                  PT                    OT                  SLP                SW                Discharge Planning/Teaching Needs:  Plan to d/c home with wife providing 24/7 assistance.  TBD   Team Discussion:  Cervical myelopathy; AKA; perseverates on pain in hands.  Cont b/b but can spill urinal.  Min A overall with mobility and ADLs and supervision goals.  Revisions to Treatment Plan:  NA    Medical Summary Current Status: R AKA in cervical myelopathy pt- severe hand nerve pain- sliding board transfers currently Weekly Focus/Goal: nerve pain  Barriers to Discharge: Behavior;Decreased family/caregiver support;Home enviroment access/layout;Neurogenic Bowel & Bladder;Weight;Weight bearing restrictions;Other (comments)  Barriers to Discharge Comments: R AKA_ and hasn't trained with prosthesis Possible Resolutions to Barriers: prosthesis training   Continued Need for Acute Rehabilitation Level of Care: The patient requires daily medical management by a physician with specialized training in physical medicine and rehabilitation for the following reasons: Direction of a multidisciplinary physical rehabilitation program to maximize functional independence : Yes Medical management of patient stability for increased activity during participation in an intensive rehabilitation regime.: Yes Analysis of laboratory values and/or radiology reports with any subsequent need for medication adjustment and/or medical intervention. : Yes   I attest that I was present, lead the team conference, and concur with the assessment and plan of the team.   Miamor Ayler,  Alta Goding 11/16/2018, 1:52 PM

## 2018-11-16 NOTE — Progress Notes (Signed)
ANTICOAGULATION CONSULT NOTE - Initial Consult  Pharmacy Consult for Eliquis Indication: Hx of VTE   Patient Measurements: Height: 5\' 6"  (167.6 cm) Weight: 149 lb 14.6 oz (68 kg) IBW/kg (Calculated) : 63.8   Vital Signs: Temp: 98 F (36.7 C) (10/07 0539) Temp Source: Oral (10/07 0539) BP: 144/58 (10/07 0539) Pulse Rate: 60 (10/07 0539)  Labs: Recent Labs    11/14/18 0719 11/15/18 0514 11/16/18 0509  HGB 8.6* 8.0* 8.2*  HCT 25.5* 24.6* 24.3*  PLT 246 224 210  CREATININE 1.38*  --   --     Estimated Creatinine Clearance: 38.5 mL/min (A) (by C-G formula based on SCr of 1.38 mg/dL (H)).   Medical History: Past Medical History:  Diagnosis Date  . Bruises easily    d/t being on Eliquis  . Carotid artery disease ([place])    a. Duplex 05/2014: 123456 RICA, 123456 LICA, elevated velocities in right subclavian artery, normal left subclavian artery..  . Chronic back pain    HNP  . Chronic kidney disease (CKD)   . CKD (chronic kidney disease)   . Claustrophobia    takes Ativan as needed  . CML (chronic myeloid leukemia) ([place])    takes Gleevec daily  . Coronary atherosclerosis of unspecified type of vessel, native or graft    a. cath in 2003 showed 10-20% LM, scattered 20% prox-mid LAD, 30% more distal LAD, 50-60% stenosis of LAD towards apex, 20% Cx, 30% dRCA, focal 95% stenosis in smaller of 2 branches of PDA, EF 60-65%.  . Diabetes mellitus ([place])    no longer- cancxelled by Dr Hale Bogus  . Diarrhea    takes Imodium daily as needed  . Diverticulosis   . Diverticulosis of colon (without mention of hemorrhage)   . DJD (degenerative joint disease)   . DVT (deep venous thrombosis) ([place]) 07/2011  . Esophageal stricture   . Essential hypertension    takes Imdur and Lisinopril daily  . Gastric polyps    benign  . GERD (gastroesophageal reflux disease)    takes Nexium daily  . Glaucoma    uses eye drops  . History of blood transfusion `   leg amputation  . History of  bronchitis    not sure when the last time  . History of colon polyps    benign  . History of kidney stones    litrotrispspy  . History of kidney stones    passed  . Insomnia    takes Melatonin nightly as needed  . Mixed hyperlipidemia    takes Crestor daily  . Myasthenia gravis ([place])    uses prednisone  . Osteoporosis 2016  . Peripheral edema    takes Lasix daily  . Peripheral vascular disease ([place])   . Pituitary tumor    checks [facility]Walden at [facility] checks it every Jan   . Pneumonia 01/2016  . PONV (postoperative nausea and vomiting)   . Ptosis   . Pulmonary embolus ([place]) 2012  . Stroke ([place]) 06/2015   x 3- last one 2018- balance issuses     Assessment:  31 male with progressive leg pain s/p ACDF one week ago. He is on Eliquis prior to admission for a history of VTE. Eliquis has been held during post-op period, neuro ok'd resumption of Eliquis one week s/p procedure. His SCr continues to fluctuate but looks like the majority of the time it is > 1.5. Given his age and fluctuating renal function, patient should be on Eliquis 2.5 mg.   Goal  of Therapy:  Monitor platelets by anticoagulation protocol: Yes   Plan:  -Eliquis 2.5 mg po bid -Monitor s/sx bleeding -Monitor renal fx   Harvel Quale 11/16/2018,1:56 PM

## 2018-11-16 NOTE — Progress Notes (Signed)
Occupational Therapy Session Note  Patient Details  Name: PLATO ALSPAUGH MRN: [id number] Date of Birth: 1940-07-17  Today's Date: 11/16/2018 OT Individual Time: 5009-3818 OT Individual Time Calculation (min): 42 min    Short Term Goals: Week 1:  OT Short Term Goal 1 (Week 1): Pt will manage 1 container during meals with min A OT Short Term Goal 2 (Week 1): Pt will don pants with lateral leans with mod A OT Short Term Goal 3 (Week 1): Pt will complete BSC transfer with CGA  Skilled Therapeutic Interventions/Progress Updates:    1:1. Pt received in w/c with no report of pain. W/c propulsion to/from all tx destinations for BUE endurance work  Box and blocks RUE 51 LUE 30  9HPT RUE 44.18 sec LUE 2 min 18 sec  Dynamoemter #s RUE 5, 9, 10 LUE 35, 35,35   Pt completes 2x12 UB therex with 1# db DB staying within cervical precaution ROM for shoulder and elbow strengthening: shoulder flex/ext, horizontal ab/adduction, ab, adduction, internal and external rotation, eleva/depression,  elbow flex/ext (4# DB). Exited session with pt seated in w/c, call light in reach and all needs met.    Therapy Documentation Precautions:  Precautions Precautions: Fall, Cervical Precaution Comments: R AKA Required Braces or Orthoses: Cervical Brace Cervical Brace: Soft collar(reviewed cervical precautions) Restrictions Weight Bearing Restrictions: No General:   Vital Signs:  Pain:   ADL:   Vision   Perception    Praxis   Exercises:   Other Treatments:     Therapy/Group: Individual Therapy  Tonny Branch 11/16/2018, 2:11 PM

## 2018-11-16 NOTE — Progress Notes (Signed)
Physical Therapy Session Note  Patient Details  Name: [name] MRN: [id number] Date of Birth: 1940-03-17  Today's Date: 11/16/2018 PT Individual Time: 0900-0955 PT Individual Time Calculation (min): 55 min   Short Term Goals: Week 1:  PT Short Term Goal 1 (Week 1): =LTG due to ELOS  Skilled Therapeutic Interventions/Progress Updates:   Pt in w/c and agreeable to therapy, pain 5/10 in L medial groin. Pt self-propelled w/c to/from therapy gym w/ BUEs and LLE w/ supervision and increased time. Squat pivot to edge of mat w/ min assist and sit>supine to don RLE prosthesis w/ total assist. Min assist supine>sit.   Worked on sit<>stands to [employer] and pre-gait prosthesis training. Sit<>stands w/ min-mod assist to boost and stabilize in stance. Verbal and tactile cues for weight shifting at hips. Once stabilized, L forward and backward stepping w/ min assist for balance, pt w/ antalgic pattern 2/2 fear of putting weight on R prosthesis. Seated rest breaks 2/2 fatigue, worked on BUE fine motor tasks during rest breaks including lego stacking, pincer grasping w/ small foam blocks, and simple peg board tasks. Performed 3 stands in total.   Practiced stand pivots to/from w/c w/ RW and RLE prosthesis, performed 4 in total w/ mod-max assist overall. Verbal, tactile, and manual cues for RLE step placement, RW management, anterior weight shifting at hips, and UE placement on armrests during sit<>stands. Overall pt w/ improved confidence with this compared to yesterday. Pain remained 5/10 in L thigh.   Returned to room and ended session in w/c, all needs in reach. Ice applied to L medial thigh for pain relief.   Therapy Documentation Precautions:  Precautions Precautions: Fall, Cervical Precaution Comments: R AKA Required Braces or Orthoses: Cervical Brace Cervical Brace: Soft collar(reviewed cervical precautions) Restrictions Weight Bearing Restrictions: No Vital Signs:  Pain: Pain Assessment Pain  Scale: 0-10 Pain Score: 2   Therapy/Group: Individual Therapy  Embry Huss K Khalise Billard 11/16/2018, 9:59 AM

## 2018-11-16 NOTE — Progress Notes (Addendum)
[facility] PHYSICAL MEDICINE & REHABILITATION PROGRESS NOTE   Subjective/Complaints: Patient seen sitting up, working with therapies this AM.  He states he slept well overnight.  He has concerns regarding his Onc meds and follow up lab work. He notes some improvement in neuropathic pain.  ROS- +Neuropathic pain.  Denies CP, SOB, N/V/D   Objective:   No results found. Recent Labs    11/15/18 0514 11/16/18 0509  WBC 9.6 10.5  HGB 8.0* 8.2*  HCT 24.6* 24.3*  PLT 224 210   Recent Labs    11/14/18 0719  NA 138  K 3.9  CL 102  CO2 28  GLUCOSE 173*  BUN 24*  CREATININE 1.38*  CALCIUM 8.4*    Intake/Output Summary (Last 24 hours) at 11/16/2018 1016 Last data filed at 11/16/2018 0815 Gross per 24 hour  Intake 700 ml  Output 1650 ml  Net -950 ml     Physical Exam: Vital Signs Blood pressure (!) 144/58, pulse 60, temperature 98 F (36.7 C), temperature source Oral, resp. rate 16, height 5\' 6"  (1.676 m), weight 68 kg, SpO2 99 %.  Constitutional: No distress . Vital signs reviewed. HENT: Normocephalic.  Atraumatic. Neck: Soft collar in place Eyes: EOMI. No discharge. Cardiovascular: No JVD. Respiratory: Normal effort.  No stridor. GI: Non-distended. Skin: Surgical incision C/D/I Psych: Normal mood.  Normal behavior. Musc: Right AKA Neuro: Alert Motor: Bilateral upper extremities: 4/5 proximal distal, stable Left lower extremity: Hip flexion, knee extension 4-/5 (pain inhibition), ankle dorsiflexion 4+/5, stable  Assessment/Plan: 1. Functional deficits secondary to cervical stenosis/HNP/severe myelopathy which require 3+ hours per day of interdisciplinary therapy in a comprehensive inpatient rehab setting.  Physiatrist is providing close team supervision and 24 hour management of active medical problems listed below.  Physiatrist and rehab team continue to assess barriers to discharge/monitor patient progress toward functional and medical goals  Care  Tool:  Bathing    Body parts bathed by patient: Right arm, Left arm, Chest, Abdomen, Front perineal area, Buttocks, Right upper leg, Face, Left upper leg   Body parts bathed by helper: Left lower leg Body parts n/a: Right lower leg   Bathing assist Assist Level: Minimal Assistance - Patient > 75%     Upper Body Dressing/Undressing Upper body dressing   What is the patient wearing?: Pull over shirt    Upper body assist Assist Level: Minimal Assistance - Patient > 75%    Lower Body Dressing/Undressing Lower body dressing      What is the patient wearing?: Pants     Lower body assist Assist for lower body dressing: Moderate Assistance - Patient 50 - 74%     Toileting Toileting Toileting Activity did not occur Landscape architect and hygiene only): N/A (no void or bm)(used urinal, no BM)  Toileting assist Assist for toileting: Minimal Assistance - Patient > 75%     Transfers Chair/bed transfer  Transfers assist     Chair/bed transfer assist level: Minimal Assistance - Patient > 75% Chair/bed transfer assistive device: Armrests   Locomotion Ambulation   Ambulation assist   Ambulation activity did not occur: Safety/medical concerns  Assist level: Minimal Assistance - Patient > 75% Assistive device: Walker-rolling Max distance: 15'   Walk 10 feet activity   Assist  Walk 10 feet activity did not occur: Safety/medical concerns  Assist level: Minimal Assistance - Patient > 75% Assistive device: Walker-rolling   Walk 50 feet activity   Assist Walk 50 feet with 2 turns activity did not occur: Safety/medical  concerns         Walk 150 feet activity   Assist Walk 150 feet activity did not occur: Safety/medical concerns         Walk 10 feet on uneven surface  activity   Assist Walk 10 feet on uneven surfaces activity did not occur: Safety/medical concerns         Wheelchair     Assist Will patient use wheelchair at discharge?:  Yes Type of Wheelchair: Manual    Wheelchair assist level: Supervision/Verbal cueing Max wheelchair distance: 150'    Wheelchair 50 feet with 2 turns activity    Assist        Assist Level: Supervision/Verbal cueing   Wheelchair 150 feet activity     Assist  Wheelchair 150 feet activity did not occur: Safety/medical concerns   Assist Level: Supervision/Verbal cueing   Blood pressure (!) 144/58, pulse 60, temperature 98 F (36.7 C), temperature source Oral, resp. rate 16, height 5\' 6"  (1.676 m), weight 68 kg, SpO2 99 %.  Medical Problem List and Plan: 1.Decreased functional ability with upper extremity weaknesssecondary to cervical stenosis/HNP/severe myelopathy. Status post C3-4 anterior cervical corpectomy andAnterior cervical arthrodesis C3-4 4-5 and 5-6 11/09/2018.  Cont CIR  Soft collar as directed  Decadron wean to 2 mg every 8 on 10/7 2. Antithrombotics: -DVT/anticoagulation:Chronic Eliquis on hold x7 days from day of surgery, restarted on 10/7 - ?prescribed 2.5 vs 5 BID PTA, will follow up -antiplatelet therapy: N/A 3. Pain Management:Oxycodone and Robaxin as needed  Added Lidocaine cream for hands B/L QID   Gabapentin increased to 200 3 times daily on 10/5 with benefit, will consider increase if renal funciton allows 4. Mood:Provide emotional support -antipsychotic agents: N/A 5. Neuropsych: This patientiscapable of making decisions on hisown behalf. 6. Skin/Wound Care:Routine skin checks 7. Fluids/Electrolytes/Nutrition:Routine in and outs 8. Acute on chronic anemia.   Hemoglobin 8.2 on 10/7  Hemoccult ordered, remains pending 9.Chronic myeloid leukemia. Follow-up outpatient  Will follow up regarding initiation of meds while in CIR 10. CKD stage III. Creatinine baseline 1.63-1.79  Creatinine 1.38 on 10/5, labs ordered for tomorrow  Continue to monitor 11. 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 12.  Right AKA 2019. Patient did receive inpatient rehab services 13. Myasthenia gravis. Continue Mestinon and low-dose chronic prednisone 14. Hypertension. Lasix 40 mg daily, Imdur 30 mg daily.   Labile on 10/7 15.  Klebsiella UTI   Cipro started on 10/5 16. Neurogenic bowel:  Increase bowel meds as needed 17.  Steroid-induced hyperglycemia  Continue to monitor with steroid wean 18.  Hypoalbuminemia  Supplement initiated on 10/5  LOS: 5 days A FACE TO FACE EVALUATION WAS PERFORMED   Lorie Phenix 11/16/2018, 10:16 AM

## 2018-11-16 NOTE — Progress Notes (Signed)
Occupational Therapy Session Note  Patient Details  Name: [name] MRN: [id number] Date of Birth: July 21, 1940  Today's Date: 11/16/2018 OT Individual Time: 0700-0756 OT Individual Time Calculation (min): 56 min    Short Term Goals: Week 1:  OT Short Term Goal 1 (Week 1): Pt will manage 1 container during meals with min A OT Short Term Goal 2 (Week 1): Pt will don pants with lateral leans with mod A OT Short Term Goal 3 (Week 1): Pt will complete BSC transfer with CGA  Skilled Therapeutic Interventions/Progress Updates:    1:1. Pt received in bed agreeable to OT. 5/10 pain and requested pain medication to RN. Pt completes lateral scoot transfer with MIN A and VC for hand placement. Pt completes grooming at sink with MIN A for around incision. Pt dons UB and LB clothing with min A and A for othosis. Pt sit to stand with MIN A for OT to advance pants past hips in top of w/c push up. Pt propels w/c to/from tx gym with 1 rest break each direction for improved BUE endurance. Pt very emotional at times requiring support and encouragement that hand function will improve. Pt dons sock with sock aide after demo with min A to stabilize sock aide while using BUE to apply sock to sock aide. Exited session with pt seated in w/c, exit alarm on, call light in reach and all needs met.  Therapy Documentation Precautions:  Precautions Precautions: Fall, Cervical Precaution Comments: R AKA Required Braces or Orthoses: Cervical Brace Cervical Brace: Soft collar(reviewed cervical precautions) Restrictions Weight Bearing Restrictions: No General:   Vital Signs: Therapy Vitals Temp: 98 F (36.7 C) Temp Source: Oral Pulse Rate: 60 Resp: 16 BP: (!) 144/58 Patient Position (if appropriate): Lying Oxygen Therapy SpO2: 99 % O2 Device: Room Air Pain:   ADL:   Vision   Perception    Praxis   Exercises:   Other Treatments:     Therapy/Group: Individual Therapy  Tonny Branch 11/16/2018, 7:14 AM

## 2018-11-16 NOTE — Progress Notes (Signed)
Occupational Therapy Session Note  Patient Details  Name: [name] MRN: [id number] Date of Birth: 15-Sep-1940  Today's Date: 11/16/2018 OT Individual Time: 1130-1200 OT Individual Time Calculation (min): 30 min    Short Term Goals: Week 1:  OT Short Term Goal 1 (Week 1): Pt will manage 1 container during meals with min A OT Short Term Goal 2 (Week 1): Pt will don pants with lateral leans with mod A OT Short Term Goal 3 (Week 1): Pt will complete BSC transfer with CGA  Skilled Therapeutic Interventions/Progress Updates:    1:1. Pt received in w/c with no pain reported just tingling in hands. Pt completes w/c propulsion with S. Pt completes manipulation, turning over, picking up, reaching and finger>palm translation of varying size blocks with VC for decreasing compensatry strategies to improve [facility] and dexterity or BUE. Exited session with pt seated in w/c, call light in reach and all need smet  Therapy Documentation Precautions:  Precautions Precautions: Fall, Cervical Precaution Comments: R AKA Required Braces or Orthoses: Cervical Brace Cervical Brace: Soft collar(reviewed cervical precautions) Restrictions Weight Bearing Restrictions: No General:   Vital Signs:  Pain: Pain Assessment Pain Scale: 0-10 Pain Score: 2  ADL:   Vision   Perception    Praxis   Exercises:   Other Treatments:     Therapy/Group: Individual Therapy  Tonny Branch 11/16/2018, 12:11 PM

## 2018-11-17 ENCOUNTER — Inpatient Hospital Stay (HOSPITAL_COMMUNITY): Payer: No Typology Code available for payment source | Admitting: Rehabilitation

## 2018-11-17 ENCOUNTER — Inpatient Hospital Stay (HOSPITAL_COMMUNITY): Payer: No Typology Code available for payment source | Admitting: Occupational Therapy

## 2018-11-17 ENCOUNTER — Inpatient Hospital Stay (HOSPITAL_COMMUNITY): Payer: No Typology Code available for payment source | Admitting: Physical Therapy

## 2018-11-17 ENCOUNTER — Inpatient Hospital Stay (HOSPITAL_COMMUNITY): Payer: No Typology Code available for payment source

## 2018-11-17 LAB — BASIC METABOLIC PANEL
Anion gap: 9 (ref 5–15)
BUN: 36 mg/dL — ABNORMAL HIGH (ref 8–23)
CO2: 27 mmol/L (ref 22–32)
Calcium: 8.3 mg/dL — ABNORMAL LOW (ref 8.9–10.3)
Chloride: 102 mmol/L (ref 98–111)
Creatinine, Ser: 1.48 mg/dL — ABNORMAL HIGH (ref 0.61–1.24)
GFR calc Af Amer: 51 mL/min — ABNORMAL LOW (ref >60)
GFR calc non Af Amer: 44 mL/min — ABNORMAL LOW (ref >60)
Glucose, Bld: 119 mg/dL — ABNORMAL HIGH (ref 70–99)
Potassium: 4.2 mmol/L (ref 3.5–5.1)
Sodium: 138 mmol/L (ref 135–145)

## 2018-11-17 NOTE — Progress Notes (Signed)
Physical Therapy Session Note  Patient Details  Name: [name] MRN: [id number] Date of Birth: 1940-02-28  Today's Date: 11/17/2018 PT Individual Time: 0900-0955 PT Individual Time Calculation (min): 55 min   Short Term Goals: Week 1:  PT Short Term Goal 1 (Week 1): =LTG due to ELOS  Skilled Therapeutic Interventions/Progress Updates:   Pt in supine and agreeable to therapy, pain in L groin 4-5/10. Supine>sit w/ min assist and min assist squat pivot to w/c. Set-up assist to wash face and brush teeth at sink. Pt self-propelled w/c to/from therapy gym w/ BUEs and LLE. Donned RLE prosthesis in supine on mat, total assist.   Worked on gait and transfer training w/ RW and prosthesis this session. Sit<>stands w/ min assist to RW and min-mod assist to stabilize in stance and for all standing activity. Performed multiple stand pivot transfers w/ RW, verbal and tactile cues for step placement and RW management, manual cues to bring hips forward as pt w/ posterior lean bias. Performed R/L side stepping w/ RW, verbal and visual cues to minimize hip flexion while adducting and abducting prosthesis for step placement. Pt also tends to be too forward in RW w/ step placement, needs verbal and visual cues to keep B feet between back legs of RW. Performed multiple reps of all standing activity on prosthesis, mod assist overall.   During seated rest breaks, worked on LUE fine motor w/ grasping clothspins, visual and verbal cues for technique at first.   Returned to room and ended session in w/c, all needs in reach.   Therapy Documentation Precautions:  Precautions Precautions: Fall, Cervical Precaution Comments: R AKA Required Braces or Orthoses: Cervical Brace Cervical Brace: Soft collar(reviewed cervical precautions) Restrictions Weight Bearing Restrictions: No Pain: Pain Assessment Pain Scale: 0-10 Pain Score: 6  Pain Type: Acute pain Pain Location: Leg Pain Orientation: Left Pain  Descriptors / Indicators: Sharp Pain Frequency: Constant Pain Onset: On-going Patients Stated Pain Goal: 2 Pain Intervention(s): Medication (See eMAR)  Therapy/Group: Individual Therapy  Rosalita Carey K Tayte Mcwherter 11/17/2018, 10:41 AM

## 2018-11-17 NOTE — Progress Notes (Signed)
Physical Therapy Session Note  Patient Details  Name: [name] MRN: [id number] Date of Birth: November 29, 1940  Today's Date: 11/17/2018 PT Individual Time: 1430-1535 PT Individual Time Calculation (min): 65 min   Short Term Goals: Week 1:  PT Short Term Goal 1 (Week 1): =LTG due to ELOS Week 2:    Week 3:     Skilled Therapeutic Interventions/Progress Updates:    Pain 6  /10 w/standing over time, pt rested in sitting to relieve, treatment to tolerance Pt initially oob in wc.  Pt able to negotiate wc in room to sink to wash hands prior to exiting room.   wc propulsion 136ft, 65ft , 35ft w/bilat UE/s for functional mobility training.    STS  In parallel bars w/mod assist for transition.  Able to stand 54min x 2, 37min x 1 w/therapist guarding L knee, cues to extend knee, cues to promote increased wbing thru prosthesis, cues for increased hip and trunk extension.  Able to stand w/bilat UE progressed to RUE support only and assist described above.  Rests 3-4 min between efforts.  UBE x 5 min w/LUE acewrap assist for grip performed for bilat UE strengthening and cardiovascular conditionining.  wc to bed via lateral scoot w/mod assist due to fatigue.  Therapist removed prosthesis and applied shrinker in sitting/total assist.  Therapist surface cleaned silicone sleeve.  Supine LE therex including Hip abd/add 2x10, bridge x 10, LLe Clamshell 2 x15 LLE heel slides 2x10 R isometric hip extension /glut sets x 15, hip abd/add 2x15.  Pt rolled L/R mod I using bedrail, slowly to L side.  Supine scoot w/verbal cues, use of bedrails,  and assist to stabilize L foot in bridge position  Pt left supine w/rails up x 3, alarm set, bed in lowest position, and needs in reach.  Therapy Documentation Precautions:  Precautions Precautions: Fall, Cervical Precaution Comments: R AKA Required Braces or Orthoses: Cervical Tristan Cervical Tristan: Soft collar(reviewed cervical precautions) Restrictions Weight  Bearing Restrictions: No    Therapy/Group: Individual Therapy  Callie Fielding, [place] 11/17/2018, 3:58 PM

## 2018-11-17 NOTE — Progress Notes (Signed)
Social Work Patient ID: [name], male   DOB: 1940/03/25, 78 y.o.   MRN: [id number]  Met with pt and wife yesterday to review team conference.  Both aware of targeted d/c date 9/14 and S - min A goals overall.  Both feel this will likely "not be long enough... he discharged too early last time he was here."  Encouraged them to plan on the 14th and I will monitor progress with team.  Lennart Pall, LCSW

## 2018-11-17 NOTE — Progress Notes (Addendum)
[facility] PHYSICAL MEDICINE & REHABILITATION PROGRESS NOTE   Subjective/Complaints: Patient seen laying in bed this morning.  He states he slept fairly overnight.  He requests a flu shot.  ROS- +Neuropathic pain, some improvement.  Denies CP, SOB, N/V/D   Objective:   No results found. Recent Labs    11/15/18 0514 11/16/18 0509  WBC 9.6 10.5  HGB 8.0* 8.2*  HCT 24.6* 24.3*  PLT 224 210   Recent Labs    11/17/18 0451  NA 138  K 4.2  CL 102  CO2 27  GLUCOSE 119*  BUN 36*  CREATININE 1.48*  CALCIUM 8.3*    Intake/Output Summary (Last 24 hours) at 11/17/2018 1000 Last data filed at 11/17/2018 0900 Gross per 24 hour  Intake 680 ml  Output 1050 ml  Net -370 ml     Physical Exam: Vital Signs Blood pressure (!) 155/64, pulse 61, temperature 97.7 F (36.5 C), temperature source Oral, resp. rate 16, height 5\' 6"  (1.676 m), weight 68 kg, SpO2 98 %.  Constitutional: No distress . Vital signs reviewed. HENT: Normocephalic.  Atraumatic. Eyes: EOMI. No discharge. Cardiovascular: No JVD. Respiratory: Normal effort.  No stridor. GI: Non-distended. Skin: Neck incision C/D/high Psych: Normal mood.  Normal behavior. Musc: Right AKA Left lower extremity edema Neuro: Alert Motor: Bilateral upper extremities: 4/5 proximal distal, stable Left lower extremity: Hip flexion, knee extension 4--4/5 (pain inhibition), ankle dorsiflexion 4+/5, stable  Assessment/Plan: 1. Functional deficits secondary to cervical stenosis/HNP/severe myelopathy which require 3+ hours per day of interdisciplinary therapy in a comprehensive inpatient rehab setting.  Physiatrist is providing close team supervision and 24 hour management of active medical problems listed below.  Physiatrist and rehab team continue to assess barriers to discharge/monitor patient progress toward functional and medical goals  Care Tool:  Bathing    Body parts bathed by patient: Right arm, Left arm, Chest, Abdomen, Front  perineal area, Buttocks, Right upper leg, Face, Left upper leg   Body parts bathed by helper: Left lower leg Body parts n/a: Right lower leg   Bathing assist Assist Level: Minimal Assistance - Patient > 75%     Upper Body Dressing/Undressing Upper body dressing   What is the patient wearing?: Pull over shirt    Upper body assist Assist Level: Minimal Assistance - Patient > 75%    Lower Body Dressing/Undressing Lower body dressing      What is the patient wearing?: Pants     Lower body assist Assist for lower body dressing: Moderate Assistance - Patient 50 - 74%     Toileting Toileting Toileting Activity did not occur Landscape architect and hygiene only): N/A (no void or bm)(used urinal, no BM)  Toileting assist Assist for toileting: Minimal Assistance - Patient > 75%     Transfers Chair/bed transfer  Transfers assist     Chair/bed transfer assist level: Minimal Assistance - Patient > 75% Chair/bed transfer assistive device: Armrests, Bedrails   Locomotion Ambulation   Ambulation assist   Ambulation activity did not occur: Safety/medical concerns  Assist level: Minimal Assistance - Patient > 75% Assistive device: Walker-rolling Max distance: 15'   Walk 10 feet activity   Assist  Walk 10 feet activity did not occur: Safety/medical concerns  Assist level: Minimal Assistance - Patient > 75% Assistive device: Walker-rolling   Walk 50 feet activity   Assist Walk 50 feet with 2 turns activity did not occur: Safety/medical concerns         Walk 150 feet activity  Assist Walk 150 feet activity did not occur: Safety/medical concerns         Walk 10 feet on uneven surface  activity   Assist Walk 10 feet on uneven surfaces activity did not occur: Safety/medical concerns         Wheelchair     Assist Will patient use wheelchair at discharge?: Yes Type of Wheelchair: Manual    Wheelchair assist level: Supervision/Verbal cueing Max  wheelchair distance: 150'    Wheelchair 50 feet with 2 turns activity    Assist        Assist Level: Supervision/Verbal cueing   Wheelchair 150 feet activity     Assist  Wheelchair 150 feet activity did not occur: Safety/medical concerns   Assist Level: Supervision/Verbal cueing   Blood pressure (!) 155/64, pulse 61, temperature 97.7 F (36.5 C), temperature source Oral, resp. rate 16, height 5\' 6"  (1.676 m), weight 68 kg, SpO2 98 %.  Medical Problem List and Plan: 1.Decreased functional ability with upper extremity weaknesssecondary to cervical stenosis/HNP/severe myelopathy. Status post C3-4 anterior cervical corpectomy andAnterior cervical arthrodesis C3-4 4-5 and 5-6 11/09/2018.  Continue CIR  Soft collar as directed  Decadron wean to 2 mg every 8 on 10/7, continue to wean 2. Antithrombotics: -DVT/anticoagulation:Chronic Eliquis on hold x7 days from day of surgery, restarted 2.5 twice daily on 10/7 -antiplatelet therapy: N/A 3. Pain Management:Oxycodone and Robaxin as needed  Added Lidocaine cream for hands B/L QID   Gabapentin increased to 200 3 times daily on 10/5 with benefit, will attempt to avoid further increase at this time 4. Mood:Provide emotional support -antipsychotic agents: N/A 5. Neuropsych: This patientiscapable of making decisions on hisown behalf. 6. Skin/Wound Care:Routine skin checks 7. Fluids/Electrolytes/Nutrition:Routine in and outs 8. Acute on chronic anemia.   Hemoglobin 8.2 on 10/7, labs ordered for tomorrow  Hemoccult ordered, remains pending on 10/8 9.Chronic myeloid leukemia. Follow-up outpatient  Home Gleevec restarted on 10/8 10. CKD stage III. Creatinine baseline 1.63-1.79  Creatinine 1.48 on 10/8  Encourage fluids  Continue to monitor 11. 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 Patient did receive inpatient rehab services 13. Myasthenia gravis. Continue  Mestinon and low-dose chronic prednisone 14. Hypertension. Lasix 40 mg daily, Imdur 30 mg daily.   Elevated but,?  Improving on 10/8 15.  Klebsiella UTI   Cipro started on 10/5 16. Neurogenic bowel:  Increase bowel meds as needed 17.  Steroid-induced hyperglycemia  Continue to monitor with steroid wean 18.  Hypoalbuminemia  Supplement initiated on 10/5  LOS: 6 days A FACE TO FACE EVALUATION WAS PERFORMED   Lorie Phenix 11/17/2018, 10:00 AM

## 2018-11-17 NOTE — Progress Notes (Signed)
Occupational Therapy Session Note  Patient Details  Name: [name] MRN: [id number] Date of Birth: 11/24/1940  Today's Date: 11/17/2018 OT Individual Time: 1100-1200 OT Individual Time Calculation (min): 60 min   Short Term Goals: Week 1:  OT Short Term Goal 1 (Week 1): Pt will manage 1 container during meals with min A OT Short Term Goal 2 (Week 1): Pt will don pants with lateral leans with mod A OT Short Term Goal 3 (Week 1): Pt will complete BSC transfer with CGA  Skilled Therapeutic Interventions/Progress Updates:    Pt greeted seated in wc and ready for OT treatment session. Pt propelled wc to therapy gym with 2 rest breaks. OT taped L ring and middle fingers together for support 2/2 trigger finger. Pt then completed squat-pivot wc>therapy mat with OT assist to lift arm rest and CGA for transfer-improved clearance of bottom with transfer. UB there-ex seated on mat with 4 sets of 15 chest press, straight arm raises, and bicep curl using 2lb dowel rod and staying within cervical precautions.  Pt tearful at times throughout session when discussing his PLOF and his current medical status and functional limitations. OT utilized therapeutic use of self to comfort, support, and provide encouragement to pt-pt appreciative. LB strengthening with mini squats from therapy mat with min A -3 sets of 10. Pt reported need to use the urinal, so pt completed squat-pivot back to drop arm wc with CGA. OT pushed pt back to room 2/2 urgency. Pt completed 1/2 stand in wc so OT could adjust pants, then pt able to place urinal in sitting. OT provided pt with built up handle for self-feeding for lunch today. Pt left seated in wc with alarm belt on, call bell in reach, and spouse present.   Therapy Documentation Precautions:  Precautions Precautions: Fall, Cervical Precaution Comments: R AKA Required Braces or Orthoses: Cervical Brace Cervical Brace: Soft collar(reviewed cervical  precautions) Restrictions Weight Bearing Restrictions: No Pain: Pain Assessment Pain Scale: 0-10 Pain Score: 6  Pain Type: Acute pain Pain Location: Leg Pain Orientation: Left Pain Descriptors / Indicators: Sharp Pain Frequency: Constant Pain Onset: On-going Patients Stated Pain Goal: 2 Pain Intervention(s): Repositioned   Therapy/Group: Individual Therapy  Valma Cava 11/17/2018, 12:09 PM

## 2018-11-17 NOTE — Care Management (Signed)
[facility] Individual Statement of Services  Patient Name:  QUANTAE PORTER  Date:  11/17/2018  Welcome to the [facility].  Our goal is to provide you with an individualized program based on your diagnosis and situation, designed to meet your specific needs.  With this comprehensive rehabilitation program, you will be expected to participate in at least 3 hours of rehabilitation therapies Monday-Friday, with modified therapy programming on the weekends.  Your rehabilitation program will include the following services:  Physical Therapy (PT), Occupational Therapy (OT), 24 hour per day rehabilitation nursing, Neuropsychology, Case Management (Social Worker), Rehabilitation Medicine, Nutrition Services and Pharmacy Services  Weekly team conferences will be held on Tuesdays to discuss your progress.  Your Social Worker will talk with you frequently to get your input and to update you on team discussions.  Team conferences with you and your family in attendance may also be held.  Expected length of stay: 7-10 days   Overall anticipated outcome: supervision- min assist  Depending on your progress and recovery, your program may change. Your Social Worker will coordinate services and will keep you informed of any changes. Your Social Worker's name and contact numbers are listed  below.  The following services may also be recommended but are not provided by the [facility] will be made to provide these services after discharge if needed.  Arrangements include referral to agencies that provide these services.  Your insurance has been verified to be:  Medicare Your primary doctor is:  Ethelene Hal  Pertinent information will be shared with your doctor and your insurance company.  Social Worker:  [place], [place] or ([phone]   Information discussed with and copy given to patient by: Lennart Pall, 11/17/2018, 1:13 PM

## 2018-11-18 ENCOUNTER — Inpatient Hospital Stay (HOSPITAL_COMMUNITY): Payer: No Typology Code available for payment source | Admitting: Physical Therapy

## 2018-11-18 ENCOUNTER — Inpatient Hospital Stay (HOSPITAL_COMMUNITY): Payer: No Typology Code available for payment source | Admitting: Occupational Therapy

## 2018-11-18 DIAGNOSIS — I10 Essential (primary) hypertension: Secondary | ICD-10-CM

## 2018-11-18 LAB — OCCULT BLOOD X 1 CARD TO LAB, STOOL: Fecal Occult Bld: NEGATIVE

## 2018-11-18 MED ORDER — FUROSEMIDE 40 MG PO TABS
40.0000 mg | ORAL_TABLET | Freq: Once | ORAL | Status: AC
  Administered 2018-11-18: 18:00:00 40 mg via ORAL
  Filled 2018-11-18: qty 1

## 2018-11-18 NOTE — Progress Notes (Signed)
Physical Therapy Session Note  Patient Details  Name: [name] MRN: [id number] Date of Birth: 12/19/1940  Today's Date: 11/18/2018 PT Individual Time: 1615-1700 PT Individual Time Calculation (min): 45 min   Short Term Goals: Week 1:  PT Short Term Goal 1 (Week 1): =LTG due to ELOS  Skilled Therapeutic Interventions/Progress Updates:   Pt received sitting in WC and agreeable to PT. PT treatment session focused on WC mobility and LE strengthening. WC mobility throughout various environments including hall of rehab unit, food court, and cement sidewalk outside [facility], supervision assist with decreased speed and use of BUE and RLE to steer around obstacles and prevent loss of control on unlevel surface 120, 50f, 1579fand 75. Seated BLE therex: LAQ on the L x 12, hip abduction x 10 BLE with cues for pain free range on the L, calf raises 2 x 20. Cues for full ROM and proper speed throughout, 1 instance on LLE quad/adductor spasm for ~15 sec. [facility] returned to [facility] and [facility] and all needs met.         Therapy Documentation Precautions:  Precautions Precautions: Fall, Cervical Precaution Comments: R AKA Required Braces or Orthoses: Cervical Brace Cervical Brace: Soft collar(reviewed cervical precautions) Restrictions Weight Bearing Restrictions: No Vital Signs: Therapy Vitals Temp: 98.1 F (36.7 C) Pulse Rate: 60 Resp: 19 BP: (!) 109/47 Oxygen Therapy SpO2: 100 % Pain:   2/10. L leg discomfort, pt repositioned.    Therapy/Group: Individual Therapy  AuLorie Phenix0/10/2018, 5:26 PM

## 2018-11-18 NOTE — Progress Notes (Signed)
[place] PHYSICAL MEDICINE & REHABILITATION PROGRESS NOTE   Subjective/Complaints: Patient seen sitting up in bed this morning.  He states he slept really well overnight.  He notes his pain is relatively controlled.  He is upset about the fact that his fine motor coordination is not back to normal-discussed again length of time for recovery.  He is questions regarding resolution of infection.  ROS- +Neuropathic pain, improving.  Denies CP, SOB, N/V/D   Objective:   No results found. Recent Labs    11/16/18 0509  WBC 10.5  HGB 8.2*  HCT 24.3*  PLT 210   Recent Labs    11/17/18 0451  NA 138  K 4.2  CL 102  CO2 27  GLUCOSE 119*  BUN 36*  CREATININE 1.48*  CALCIUM 8.3*    Intake/Output Summary (Last 24 hours) at 11/18/2018 0945 Last data filed at 11/18/2018 0515 Gross per 24 hour  Intake 120 ml  Output 400 ml  Net -280 ml     Physical Exam: Vital Signs Blood pressure (!) 145/57, pulse 60, temperature (!) 97.5 F (36.4 C), temperature source Oral, resp. rate 16, height 5\' 6"  (1.676 m), weight 68 kg, SpO2 100 %.  Constitutional: No distress . Vital signs reviewed. HENT: Normocephalic.  Atraumatic. Eyes: EOMI. No discharge. Cardiovascular: No JVD. Respiratory: Normal effort.  No stridor. GI: Non-distended. Skin: Neck incision with dressing C/D/I Psych: Normal mood.  Normal behavior. Musc: Right AKA Left lower extremity edema Neuro: Alert Motor: Bilateral upper extremities: 4/5 proximal distal, unchanged Left lower extremity: Hip flexion, knee extension 4/5 (pain inhibition), ankle dorsiflexion 4+/5  Assessment/Plan: 1. Functional deficits secondary to cervical stenosis/HNP/severe myelopathy which require 3+ hours per day of interdisciplinary therapy in a comprehensive inpatient rehab setting.  Physiatrist is providing close team supervision and 24 hour management of active medical problems listed below.  Physiatrist and rehab team continue to assess barriers  to discharge/monitor patient progress toward functional and medical goals  Care Tool:  Bathing    Body parts bathed by patient: Right arm, Left arm, Chest, Abdomen, Front perineal area, Buttocks, Right upper leg, Face, Left upper leg   Body parts bathed by helper: Left lower leg Body parts n/a: Right lower leg   Bathing assist Assist Level: Minimal Assistance - Patient > 75%     Upper Body Dressing/Undressing Upper body dressing   What is the patient wearing?: Pull over shirt    Upper body assist Assist Level: Minimal Assistance - Patient > 75%    Lower Body Dressing/Undressing Lower body dressing      What is the patient wearing?: Pants     Lower body assist Assist for lower body dressing: Moderate Assistance - Patient 50 - 74%     Toileting Toileting Toileting Activity did not occur Landscape architect and hygiene only): N/A (no void or bm)(used urinal, no BM)  Toileting assist Assist for toileting: Minimal Assistance - Patient > 75%     Transfers Chair/bed transfer  Transfers assist     Chair/bed transfer assist level: Minimal Assistance - Patient > 75% Chair/bed transfer assistive device: Armrests, Programmer, multimedia   Ambulation assist   Ambulation activity did not occur: Safety/medical concerns  Assist level: Minimal Assistance - Patient > 75% Assistive device: Walker-rolling Max distance: 15'   Walk 10 feet activity   Assist  Walk 10 feet activity did not occur: Safety/medical concerns  Assist level: Minimal Assistance - Patient > 75% Assistive device: Walker-rolling   Walk 50  feet activity   Assist Walk 50 feet with 2 turns activity did not occur: Safety/medical concerns         Walk 150 feet activity   Assist Walk 150 feet activity did not occur: Safety/medical concerns         Walk 10 feet on uneven surface  activity   Assist Walk 10 feet on uneven surfaces activity did not occur: Safety/medical  concerns         Wheelchair     Assist Will patient use wheelchair at discharge?: Yes Type of Wheelchair: Manual    Wheelchair assist level: Supervision/Verbal cueing Max wheelchair distance: 150'    Wheelchair 50 feet with 2 turns activity    Assist        Assist Level: Supervision/Verbal cueing   Wheelchair 150 feet activity     Assist  Wheelchair 150 feet activity did not occur: Safety/medical concerns   Assist Level: Supervision/Verbal cueing   Blood pressure (!) 145/57, pulse 60, temperature (!) 97.5 F (36.4 C), temperature source Oral, resp. rate 16, height 5\' 6"  (1.676 m), weight 68 kg, SpO2 100 %.  Medical Problem List and Plan: 1.Decreased functional ability with upper extremity weaknesssecondary to cervical stenosis/HNP/severe myelopathy. Status post C3-4 anterior cervical corpectomy andAnterior cervical arthrodesis C3-4 4-5 and 5-6 11/09/2018.  Continue CIR  Soft collar as directed  Decadron wean to 2 mg every 8 on 10/7, will continue to slowly wean 2. Antithrombotics: -DVT/anticoagulation:Chronic Eliquis on hold x7 days from day of surgery, restarted 2.5 twice daily on 10/7 -antiplatelet therapy: N/A 3. Pain Management:Oxycodone and Robaxin as needed  Added Lidocaine cream for hands B/L QID   Gabapentin increased to 200 3 times daily on 10/5 with benefit, will attempt to avoid further increase at this time 4. Mood:Provide emotional support -antipsychotic agents: N/A 5. Neuropsych: This patientiscapable of making decisions on hisown behalf. 6. Skin/Wound Care:Routine skin checks 7. Fluids/Electrolytes/Nutrition:Routine in and outs 8. Acute on chronic anemia.   Hemoglobin 8.2 on 10/7, labs ordered for Monday  Hemoccult ordered, remains pending on 10/9 9.Chronic myeloid leukemia. Follow-up outpatient  Home Gleevec restarted on 10/8 10. CKD stage III. Creatinine baseline 1.63-1.79  Creatinine  1.48 on 10/8, labs ordered for Monday  Encourage fluids  Continue to monitor 11. 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 Patient did receive inpatient rehab services 13. Myasthenia gravis. Continue Mestinon and low-dose chronic prednisone 14. Hypertension.   Lasix 40 mg daily, extra dose ordered on 10/9  Imdur 30 mg daily.   Elevated but,?  Improving on 10/8 15.  Klebsiella UTI   Cipro started on 10/5 16. Neurogenic bowel:  Increase bowel meds as needed 17.  Steroid-induced hyperglycemia  Continue to monitor with steroid wean 18.  Hypoalbuminemia  Supplement initiated on 10/5  LOS: 7 days A FACE TO FACE EVALUATION WAS PERFORMED  Lauran Romanski Lorie Phenix 11/18/2018, 9:45 AM

## 2018-11-18 NOTE — Progress Notes (Signed)
Occupational Therapy Weekly Progress Note  Patient Details  Name: [name] MRN: [id number] Date of Birth: 01-25-1941  Beginning of progress report period: November 12, 2018 End of progress report period: November 18, 2018  Today's Date: 11/18/2018 OT Individual Time: 1300-1414 OT Individual Time Calculation (min): 74 min    Patient has met 2 of 3 short term goals.  Pt is making steady progress towards OT goals at this time. Pt has demonstrated improved functional transfers at an overall CGA/min A level. He has more LLE strength and is working on his tolerance to wear his R prosthesis. Pt continues to have strength and sensation deficits in hands limiting his fine motor coordination, but this is improving and he is learning compensation strategies. Continue current POC.   Patient continues to demonstrate the following deficits: muscle weakness, decreased cardiorespiratoy endurance and decreased sitting balance, decreased standing balance, decreased postural control and decreased balance strategies and therefore will continue to benefit from skilled OT intervention to enhance overall performance with BADL and Reduce care partner burden.  Patient progressing toward long term goals..  Continue plan of care.  OT Short Term Goals Week 1:  OT Short Term Goal 1 (Week 1): Pt will manage 1 container during meals with min A OT Short Term Goal 1 - Progress (Week 1): Met OT Short Term Goal 2 (Week 1): Pt will don pants with lateral leans with mod A OT Short Term Goal 2 - Progress (Week 1): Met OT Short Term Goal 3 (Week 1): Pt will complete BSC transfer with CGA OT Short Term Goal 3 - Progress (Week 1): Progressing toward goal Week 2:  OT Short Term Goal 1 (Week 2): LTG=STG 2/2 ELOS  Skilled Therapeutic Interventions/Progress Updates:    Pt greeted seated in wc and agreeable to OT treatment session. Pt wanted to work on standing today. Pt propelled wc to therapy gym with increased time and 3 rest  breaks. Squat-pivot to therapy mat with CGA. Pt with improved clearance of bottom with transfers. OT placed gait belt, then pt came to standing with min A x 4 trials. Pt stood first for 1 minute, 3 minutes, 4 minutes, then 5 minutes.  Pt needed CGA overall for balance. Pt took extended rest breaks prior to standing again. Pt concerned that he will not be ready for dc by Wednesday. Discussed patient progress and OT goals and encouraged pt that he is progressing well with therapy. Reminded pt that we will have his wife work with [place] during therapy next week to help her feel more prepare for dc home. Pt reported need to go to the bathroom. OT propelled wc back to room 2/2 urgency. Pt completed squat-pivot to drop arm BSC over toilet with min A. Worked on lateral leans to pull pants down. Pt with difficulty grasping pants to push off of hips 2/2 grip strength and sensation deficits in hands. Pt was able to eventually get pants off of R side, but needed OT assist to remove them from L. Pt with successful BM and bladder. Pt completed his own peri-care without assistance. Tried lateral leans again to pull up pants, but pt frustrated at his grip and stated he couldn't do it despite encouragement. Pt came into partial standing and OT pulled pants up over hips. Pt then pivoted back to wc with CGA. Pt washed hands at the sink and was left seated in wc with spouse present, call bell in reach, and chair alarm on.   Therapy Documentation Precautions:  Precautions Precautions: Fall, Cervical Precaution Comments: R AKA Required Braces or Orthoses: Cervical Brace Cervical Brace: Soft collar(reviewed cervical precautions) Restrictions Weight Bearing Restrictions: No Pain:   Pt reports pain in LLE, up to 4/10 in standing, but improved with rest and reposition.   Therapy/Group: Individual Therapy  Valma Cava 11/18/2018, 1:45 PM

## 2018-11-18 NOTE — Progress Notes (Addendum)
Physical Therapy Session Note  Patient Details  Name: [name] MRN: [id number] Date of Birth: Aug 22, 1940  Today's Date: 11/18/2018 PT Individual Time: 0900-0955 AND 1110-1155 AND 1435-1445 PT Individual Time Calculation (min): 55 min AND 45 min AND 10 min   Short Term Goals: Week 1:  PT Short Term Goal 1 (Week 1): =LTG due to ELOS  Skilled Therapeutic Interventions/Progress Updates:   Session 1:  Pt received in w/c and agreeable to therapy, pain 4/10 in L groin, re-applied kinesiotape w/ medial/distal anchor and 50% pull in direction of adductor musculature. Pt self-propelled w/c to/from therapy gym w/ BUEs and LLE w/ supervision and increased time. Donned RLE prosthesis in supine w/ total assist and worked on Diplomatic Services operational officer to/from w/c. Performed x4 in each direction w/ min assist-CGA, much improved and good carryover from previous session w/ step placement and RW management. Occasional verbal and tactile cues for upright balance, step placement, and RW management. During seated rest breaks, worked on core strengthening w/ reclined sit-ups 2x5 and trunk rotations w/ 1 kg ball 3x10. Returned to room and ended session in w/c, all needs in reach.   Session 2:  Pt in w/c and agreeable to extra therapy time, pain 4/10 in L groin. Pt self-propelled w/c to/from day room w/ supervision. Squat pivot to/from NuStep w/ min-mod assist, this was the first time pt performed squat pivot w/ RLE prosthesis. Pt unable to perform NuStep w/ prosthesis donned as it was pinching into his R goin. NuStep 3 min and 2 min @ level 3, and 3 min @ level 1, w/ LLE and BUEs to work on global endurance and strengthening. Performed pipe tree w/ BUEs to work on fine motor and gross UE strengthening. Min problem solving and error correction cues. Returned to room and ended session in w/c, all needs in reach.   Session 3: Therapist met w/ pt and wife to discuss pt progress and CLOF. Educated wife on expectations for  discharge functional level as pt often telling wife he is worse than he is. Discussed feasibility of wife providing min-CGA for transfers, including car transfer and she is agreeable and states this would not be an issue. Wife planning to be present for therapies Monday and Tuesday for hands-on training.   Therapy Documentation Precautions:  Precautions Precautions: Fall, Cervical Precaution Comments: R AKA Required Braces or Orthoses: Cervical Brace Cervical Brace: Soft collar(reviewed cervical precautions) Restrictions Weight Bearing Restrictions: No  Therapy/Group: Individual Therapy  [name] [name] 11/18/2018, 10:00 AM

## 2018-11-18 NOTE — Plan of Care (Signed)
  Problem: SCI BOWEL ELIMINATION Goal: RH STG MANAGE BOWEL WITH ASSISTANCE Description: STG Manage Bowel with mod Assistance. Outcome: Progressing Goal: RH STG SCI MANAGE BOWEL WITH MEDICATION WITH ASSISTANCE Description: STG SCI Manage bowel with medication with min assistance. Outcome: Progressing Goal: RH STG SCI MANAGE BOWEL PROGRAM W/ASSIST OR AS APPROPRIATE Description: STG SCI Manage bowel program w/mod assist or as appropriate. Outcome: Progressing   Problem: SCI BLADDER ELIMINATION Goal: RH STG MANAGE BLADDER WITH ASSISTANCE Description: STG Manage Bladder With min Assistance Outcome: Progressing   Problem: RH SKIN INTEGRITY Goal: RH STG SKIN FREE OF INFECTION/BREAKDOWN Description: While on rehab Outcome: Progressing Goal: RH STG MAINTAIN SKIN INTEGRITY WITH ASSISTANCE Description: STG Maintain Skin Integrity With mod Assistance. Outcome: Progressing Goal: RH STG ABLE TO PERFORM INCISION/WOUND CARE W/ASSISTANCE Description: STG Able To Perform Incision/Wound Care With max Assistance. Outcome: Progressing   Problem: RH SAFETY Goal: RH STG ADHERE TO SAFETY PRECAUTIONS W/ASSISTANCE/DEVICE Description: STG Adhere to Safety Precautions With min Assistance/Device. Outcome: Progressing   Problem: RH PAIN MANAGEMENT Goal: RH STG PAIN MANAGED AT OR BELOW PT'S PAIN GOAL Description: Less than 3 on pain scale Outcome: Progressing   Problem: RH KNOWLEDGE DEFICIT SCI Goal: RH STG INCREASE KNOWLEDGE OF SELF CARE AFTER SCI Description: Prior to discharge Outcome: Progressing   Problem: Consults Goal: RH SPINAL CORD INJURY PATIENT EDUCATION Description:  See Patient Education module for education specifics.  Outcome: Progressing

## 2018-11-19 ENCOUNTER — Inpatient Hospital Stay (HOSPITAL_COMMUNITY): Payer: No Typology Code available for payment source | Admitting: Physical Therapy

## 2018-11-19 NOTE — Progress Notes (Signed)
Physical Therapy Session Note  Patient Details  Name: [name] MRN: [id number] Date of Birth: 19-Feb-1940  Today's Date: 11/19/2018 PT Individual Time: 1000-1100 PT Individual Time Calculation (min): 60 min   Short Term Goals: Week 1:  PT Short Term Goal 1 (Week 1): =LTG due to ELOS  Skilled Therapeutic Interventions/Progress Updates:   Pt received sitting in WC and agreeable to PT. PT assisted pt to don Ted hose and L shoe sitting in [facility] for time management.   WC mobility through hall 164f + [id number] BUE and LLE, supervision assist for safety in turns, and decreased speed due to LLE adductor pain and L hand tenderness.   Squat pivot transfer to mat table with CGA with min verbal instruction for LE and UE placement. Scooting EOB to  The R with distant supervision assist with cues for anterior weight shift. Sit>supine with supervision assist   Supine therex:  RLE hip flexion/isometric extension. LLE SLR with AAROM x 5. LLE SAQ 2x10. Hip abduction BLE x 10. Bridge through bolster 2 x 10.  Sidelying therex RLE hip extension with 3 sec hold x 12, RLE hip abduction x 12  Throughout therex, PT provided multimodal instructions for fullROM, decreased trunk compensations, and decreased speed of eccentric movement.   Supine>sit with mod assist from flat surface with no bed rails; moderate multimodal cues for log roll technique and use of RUE to push through elbow and hand. Squat pivot to the L with supervision assist and min cues for set up.   PT assisted pt in personal care task of brushing teeth and shaving at sink. Cues for set up of each task and awareness of cervical precautions to prevent extension and rotation intermittently throughout.   [facility] in reach and all needs met.         Therapy Documentation Precautions:  Precautions Precautions: Fall, Cervical Precaution Comments: R AKA Required Braces or Orthoses: Cervical Brace Cervical  Brace: Soft collar(reviewed cervical precautions) Restrictions Weight Bearing Restrictions: No Pain: Pain Assessment Pain Scale: 0-10 Pain Score: 3  Pain Type: Acute pain Pain Location: Leg Pain Orientation: Left Pain Descriptors / Indicators: Discomfort Pain Frequency: Constant Pain Onset: On-going Pain Intervention(s): Repositioned;Rest    Therapy/Group: Individual Therapy  AustLorie Phenix10/2020, 11:05 AM

## 2018-11-19 NOTE — Progress Notes (Signed)
[name] PHYSICAL MEDICINE & REHABILITATION PROGRESS NOTE   Subjective/Complaints: Has pain in the left medial thigh.  He states that this has been going on for 8 months.  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  ROS- +Neuropathic pain, improving.  Denies CP, SOB, N/V/D   Objective:   No results found. No results for input(s): WBC, HGB, HCT, PLT in the last 72 hours. Recent Labs    11/17/18 0451  NA 138  K 4.2  CL 102  CO2 27  GLUCOSE 119*  BUN 36*  CREATININE 1.48*  CALCIUM 8.3*    Intake/Output Summary (Last 24 hours) at 11/19/2018 1141 Last data filed at 11/19/2018 0800 Gross per 24 hour  Intake 240 ml  Output 1600 ml  Net -1360 ml     Physical Exam: Vital Signs Blood pressure (!) 137/46, pulse 65, temperature 98.3 F (36.8 C), resp. rate 16, height 5\' 6"  (1.676 m), weight 68 kg, SpO2 99 %.  Constitutional: No distress . Vital signs reviewed. HENT: Normocephalic.  Atraumatic. Eyes: EOMI. No discharge. Cardiovascular: No JVD. Respiratory: Normal effort.  No stridor. GI: Non-distended. Skin: Neck incision with dressing C/D/I Psych: Normal mood.  Normal behavior. Musc: Right AKA Left lower extremity edema Neuro: Alert Motor: Bilateral upper extremities: 4/5 proximal distal, unchanged Left lower extremity: Hip flexion, knee extension 4/5 (pain inhibition), ankle dorsiflexion 4+/5  Assessment/Plan: 1. Functional deficits secondary to cervical stenosis/HNP/severe myelopathy which require 3+ hours per day of interdisciplinary therapy in a comprehensive inpatient rehab setting.  Physiatrist is providing close team supervision and 24 hour management of active medical problems listed below.  Physiatrist and rehab team continue to assess barriers to discharge/monitor patient progress toward functional and medical goals  Care Tool:  Bathing    Body  parts bathed by patient: Right arm, Left arm, Chest, Abdomen, Front perineal area, Buttocks, Right upper leg, Face, Left upper leg   Body parts bathed by helper: Left lower leg Body parts n/a: Right lower leg   Bathing assist Assist Level: Minimal Assistance - Patient > 75%     Upper Body Dressing/Undressing Upper body dressing   What is the patient wearing?: Pull over shirt    Upper body assist Assist Level: Minimal Assistance - Patient > 75%    Lower Body Dressing/Undressing Lower body dressing      What is the patient wearing?: Pants     Lower body assist Assist for lower body dressing: Minimal Assistance - Patient > 75%     Toileting Toileting Toileting Activity did not occur (Clothing management and hygiene only): N/A (no void or bm)(used urinal, no BM)  Toileting assist Assist for toileting: Minimal Assistance - Patient > 75%     Transfers Chair/bed transfer  Transfers assist     Chair/bed transfer assist level: Minimal Assistance - Patient > 75% Chair/bed transfer assistive device: Armrests, Programmer, multimedia   Ambulation assist   Ambulation activity did not occur: Safety/medical concerns  Assist level: Minimal Assistance - Patient > 75% Assistive device: Walker-rolling Max distance: 15'   Walk 10 feet activity   Assist  Walk 10 feet activity did not occur: Safety/medical concerns  Assist level: Minimal Assistance - Patient > 75% Assistive device: Walker-rolling   Walk 50 feet activity   Assist Walk 50 feet with 2 turns activity did not occur: Safety/medical concerns  Walk 150 feet activity   Assist Walk 150 feet activity did not occur: Safety/medical concerns         Walk 10 feet on uneven surface  activity   Assist Walk 10 feet on uneven surfaces activity did not occur: Safety/medical concerns         Wheelchair     Assist Will patient use wheelchair at discharge?: Yes Type of Wheelchair:  Manual    Wheelchair assist level: Supervision/Verbal cueing Max wheelchair distance: 150'    Wheelchair 50 feet with 2 turns activity    Assist        Assist Level: Supervision/Verbal cueing   Wheelchair 150 feet activity     Assist  Wheelchair 150 feet activity did not occur: Safety/medical concerns   Assist Level: Supervision/Verbal cueing   Blood pressure (!) 137/46, pulse 65, temperature 98.3 F (36.8 C), resp. rate 16, height 5\' 6"  (1.676 m), weight 68 kg, SpO2 99 %.  Medical Problem List and Plan: 1.Decreased functional ability with upper extremity weaknesssecondary to cervical stenosis/HNP/severe myelopathy. Status post C3-4 anterior cervical corpectomy andAnterior cervical arthrodesis C3-4 4-5 and 5-6 11/09/2018.  Continue CIR  Soft collar as directed  Decadron wean to 2 mg every 8 on 10/7, will continue to slowly wean 2. Antithrombotics: -DVT/anticoagulation:Chronic Eliquis on hold x7 days from day of surgery, restarted 2.5 twice daily on 10/7 -antiplatelet therapy: N/A 3. Pain Management:Oxycodone and Robaxin as needed  Added Lidocaine cream for hands B/L QID   Gabapentin increased to 200 3 times daily on 10/5 with benefit, will attempt to avoid further increase at this time Left lower extremity pain likely is radicular 4. Mood:Provide emotional support -antipsychotic agents: N/A 5. Neuropsych: This patientiscapable of making decisions on hisown behalf. 6. Skin/Wound Care:Routine skin checks 7. Fluids/Electrolytes/Nutrition:Routine in and outs 8. Acute on chronic anemia.   Hemoglobin 8.2 on 10/7, labs ordered for Monday  Hemoccult ordered, remains pending on 10/9 9.Chronic myeloid leukemia. Follow-up outpatient  Home Gleevec restarted on 10/8 10. CKD stage III. Creatinine baseline 1.63-1.79  Creatinine 1.48 on 10/8, labs ordered for Monday  Encourage fluids  Continue to monitor 11. History of  subcortical CVA in the past. 12. Right AKA 2019. Patient did receive inpatient rehab services 13. Myasthenia gravis. Continue Mestinon and low-dose chronic prednisone 14. Hypertension.   Lasix 40 mg daily, extra dose ordered on 10/9  Imdur 30 mg daily.   Elevated but,?  Improving on 10/8 15.  Klebsiella UTI   Cipro started on 10/5 16. Neurogenic bowel:  Increase bowel meds as needed 17.  Steroid-induced hyperglycemia  Continue to monitor with steroid wean 18.  Hypoalbuminemia  Supplement initiated on 10/5  LOS: 8 days A FACE TO FACE EVALUATION WAS PERFORMED  Charlett Blake 11/19/2018, 11:41 AM

## 2018-11-19 NOTE — Plan of Care (Signed)
  Problem: SCI BOWEL ELIMINATION Goal: RH STG MANAGE BOWEL WITH ASSISTANCE Description: STG Manage Bowel with mod Assistance. Outcome: Progressing Goal: RH STG SCI MANAGE BOWEL WITH MEDICATION WITH ASSISTANCE Description: STG SCI Manage bowel with medication with min assistance. Outcome: Progressing Goal: RH STG SCI MANAGE BOWEL PROGRAM W/ASSIST OR AS APPROPRIATE Description: STG SCI Manage bowel program w/mod assist or as appropriate. Outcome: Progressing   Problem: SCI BLADDER ELIMINATION Goal: RH STG MANAGE BLADDER WITH ASSISTANCE Description: STG Manage Bladder With min Assistance Outcome: Progressing   Problem: RH SKIN INTEGRITY Goal: RH STG SKIN FREE OF INFECTION/BREAKDOWN Description: While on rehab Outcome: Progressing Goal: RH STG MAINTAIN SKIN INTEGRITY WITH ASSISTANCE Description: STG Maintain Skin Integrity With mod Assistance. Outcome: Progressing Goal: RH STG ABLE TO PERFORM INCISION/WOUND CARE W/ASSISTANCE Description: STG Able To Perform Incision/Wound Care With max Assistance. Outcome: Progressing   Problem: RH SAFETY Goal: RH STG ADHERE TO SAFETY PRECAUTIONS W/ASSISTANCE/DEVICE Description: STG Adhere to Safety Precautions With min Assistance/Device. Outcome: Progressing   Problem: RH PAIN MANAGEMENT Goal: RH STG PAIN MANAGED AT OR BELOW PT'S PAIN GOAL Description: Less than 3 on pain scale Outcome: Progressing   Problem: RH KNOWLEDGE DEFICIT SCI Goal: RH STG INCREASE KNOWLEDGE OF SELF CARE AFTER SCI Description: Prior to discharge Outcome: Progressing   Problem: Consults Goal: RH SPINAL CORD INJURY PATIENT EDUCATION Description:  See Patient Education module for education specifics.  Outcome: Progressing

## 2018-11-20 MED ORDER — DEXAMETHASONE 2 MG PO TABS
1.0000 mg | ORAL_TABLET | Freq: Three times a day (TID) | ORAL | Status: DC
  Administered 2018-11-21 – 2018-11-22 (×5): 1 mg via ORAL
  Filled 2018-11-20 (×4): qty 1

## 2018-11-20 NOTE — Plan of Care (Signed)
  Problem: SCI BOWEL ELIMINATION Goal: RH STG MANAGE BOWEL WITH ASSISTANCE Description: STG Manage Bowel with mod Assistance. Outcome: Progressing Goal: RH STG SCI MANAGE BOWEL WITH MEDICATION WITH ASSISTANCE Description: STG SCI Manage bowel with medication with min assistance. Outcome: Progressing Goal: RH STG SCI MANAGE BOWEL PROGRAM W/ASSIST OR AS APPROPRIATE Description: STG SCI Manage bowel program w/mod assist or as appropriate. Outcome: Progressing   Problem: SCI BLADDER ELIMINATION Goal: RH STG MANAGE BLADDER WITH ASSISTANCE Description: STG Manage Bladder With min Assistance Outcome: Progressing   Problem: RH SKIN INTEGRITY Goal: RH STG SKIN FREE OF INFECTION/BREAKDOWN Description: While on rehab Outcome: Progressing Goal: RH STG MAINTAIN SKIN INTEGRITY WITH ASSISTANCE Description: STG Maintain Skin Integrity With mod Assistance. Outcome: Progressing Goal: RH STG ABLE TO PERFORM INCISION/WOUND CARE W/ASSISTANCE Description: STG Able To Perform Incision/Wound Care With max Assistance. Outcome: Progressing   Problem: RH SAFETY Goal: RH STG ADHERE TO SAFETY PRECAUTIONS W/ASSISTANCE/DEVICE Description: STG Adhere to Safety Precautions With min Assistance/Device. Outcome: Progressing   Problem: RH PAIN MANAGEMENT Goal: RH STG PAIN MANAGED AT OR BELOW PT'S PAIN GOAL Description: Less than 3 on pain scale Outcome: Progressing   Problem: RH KNOWLEDGE DEFICIT SCI Goal: RH STG INCREASE KNOWLEDGE OF SELF CARE AFTER SCI Description: Prior to discharge Outcome: Progressing   Problem: Consults Goal: RH SPINAL CORD INJURY PATIENT EDUCATION Description:  See Patient Education module for education specifics.  Outcome: Progressing

## 2018-11-20 NOTE — Progress Notes (Signed)
PRN oxy IR given at 2137 and 0615, for complaint of LLE pain and spasms. PRN tylenol & robaxin given at 0029, slept good after given. Continues to complain of numbness and tingling to bilateral hands. [name] A

## 2018-11-20 NOTE — Progress Notes (Signed)
[name] PHYSICAL MEDICINE & REHABILITATION PROGRESS NOTE   Subjective/Complaints: Patient still has left lower extremity pain.  Has had this for a while.  He states that Dr. Ronnald Ramp recommended surgery on his lumbar spine.  Reviewed MRI demonstrating L4-5 stenosis Strength is doing better in the arms, sensation is doing better as well per patient report  ROS- +Neuropathic pain, improving.  Denies CP, SOB, N/V/D   Objective:   No results found. No results for input(s): WBC, HGB, HCT, PLT in the last 72 hours. No results for input(s): NA, K, CL, CO2, GLUCOSE, BUN, CREATININE, CALCIUM in the last 72 hours.  Intake/Output Summary (Last 24 hours) at 11/20/2018 1137 Last data filed at 11/20/2018 1000 Gross per 24 hour  Intake 480 ml  Output 1100 ml  Net -620 ml     Physical Exam: Vital Signs Blood pressure (!) 130/52, pulse 60, temperature 98.2 F (36.8 C), resp. rate 16, height 5\' 6"  (1.676 m), weight 68 kg, SpO2 98 %.  Constitutional: No distress . Vital signs reviewed. HENT: Normocephalic.  Atraumatic. Eyes: EOMI. No discharge. Cardiovascular: No JVD. Respiratory: Normal effort.  No stridor. GI: Non-distended. Skin: Neck incision with dressing C/D/I Psych: Normal mood.  Normal behavior. Musc: Right AKA Left lower extremity edema Neuro: Alert Motor: Bilateral upper extremities: 4/5 proximal distal, unchanged Left lower extremity: Hip flexion, knee extension 4/5 (pain inhibition), ankle dorsiflexion 4+/5  Assessment/Plan: 1. Functional deficits secondary to cervical stenosis/HNP/severe myelopathy which require 3+ hours per day of interdisciplinary therapy in a comprehensive inpatient rehab setting.  Physiatrist is providing close team supervision and 24 hour management of active medical problems listed below.  Physiatrist and rehab team continue to assess barriers to discharge/monitor patient progress toward functional and medical goals  Care Tool:  Bathing    Body  parts bathed by patient: Right arm, Left arm, Chest, Abdomen, Front perineal area, Buttocks, Right upper leg, Face, Left upper leg   Body parts bathed by helper: Left lower leg Body parts n/a: Right lower leg   Bathing assist Assist Level: Minimal Assistance - Patient > 75%     Upper Body Dressing/Undressing Upper body dressing   What is the patient wearing?: Pull over shirt    Upper body assist Assist Level: Minimal Assistance - Patient > 75%    Lower Body Dressing/Undressing Lower body dressing      What is the patient wearing?: Pants     Lower body assist Assist for lower body dressing: Minimal Assistance - Patient > 75%     Toileting Toileting Toileting Activity did not occur (Clothing management and hygiene only): N/A (no void or bm)(used urinal, no BM)  Toileting assist Assist for toileting: Minimal Assistance - Patient > 75%     Transfers Chair/bed transfer  Transfers assist     Chair/bed transfer assist level: Minimal Assistance - Patient > 75% Chair/bed transfer assistive device: Armrests, Programmer, multimedia   Ambulation assist   Ambulation activity did not occur: Safety/medical concerns  Assist level: Minimal Assistance - Patient > 75% Assistive device: Walker-rolling Max distance: 15'   Walk 10 feet activity   Assist  Walk 10 feet activity did not occur: Safety/medical concerns  Assist level: Minimal Assistance - Patient > 75% Assistive device: Walker-rolling   Walk 50 feet activity   Assist Walk 50 feet with 2 turns activity did not occur: Safety/medical concerns         Walk 150 feet activity   Assist Walk 150 feet activity  did not occur: Safety/medical concerns         Walk 10 feet on uneven surface  activity   Assist Walk 10 feet on uneven surfaces activity did not occur: Safety/medical concerns         Wheelchair     Assist Will patient use wheelchair at discharge?: Yes Type of Wheelchair:  Manual    Wheelchair assist level: Supervision/Verbal cueing Max wheelchair distance: 150'    Wheelchair 50 feet with 2 turns activity    Assist        Assist Level: Supervision/Verbal cueing   Wheelchair 150 feet activity     Assist  Wheelchair 150 feet activity did not occur: Safety/medical concerns   Assist Level: Supervision/Verbal cueing   Blood pressure (!) 130/52, pulse 60, temperature 98.2 F (36.8 C), resp. rate 16, height 5\' 6"  (1.676 m), weight 68 kg, SpO2 98 %.  Medical Problem List and Plan: 1.Decreased functional ability with upper extremity weaknesssecondary to cervical stenosis/HNP/severe myelopathy. Status post C3-4 anterior cervical corpectomy andAnterior cervical arthrodesis C3-4 4-5 and 5-6 11/09/2018.  Continue CIR  Soft collar as directed  Decadron wean to 2 mg every 8 on 10/7, will continue to slowly wean, will reduce to 1 mg every 8 hours 2. Antithrombotics: -DVT/anticoagulation:Chronic Eliquis on hold x7 days from day of surgery, restarted 2.5 twice daily on 10/7 -antiplatelet therapy: N/A 3. Pain Management:Oxycodone and Robaxin as needed  Added Lidocaine cream for hands B/L QID   Gabapentin increased to 200 3 times daily on 10/5 with benefit, will attempt to avoid further increase at this time Left lower extremity pain likely is radicular 4. Mood:Provide emotional support -antipsychotic agents: N/A 5. Neuropsych: This patientiscapable of making decisions on hisown behalf. 6. Skin/Wound Care:Routine skin checks 7. Fluids/Electrolytes/Nutrition:Routine in and outs 8. Acute on chronic anemia.   Hemoglobin 8.2 on 10/7, labs ordered for Monday  Hemoccult ordered, remains pending on 10/9 9.Chronic myeloid leukemia. Follow-up outpatient  Home Gleevec restarted on 10/8 10. CKD stage III. Creatinine baseline 1.63-1.79  Creatinine 1.48 on 10/8, labs ordered for Monday  Encourage fluids  Continue  to monitor 11. 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 Patient did receive inpatient rehab services 13. Myasthenia gravis. Continue Mestinon and low-dose chronic prednisone 14. Hypertension.   Lasix 40 mg daily, extra dose ordered on 10/9  Imdur 30 mg daily.   Elevated but,?  Improving on 10/8 15.  Klebsiella UTI   Cipro started on 10/5, 7-day course 16. Neurogenic bowel:  Increase bowel meds as needed 17.  Steroid-induced hyperglycemia  Continue to monitor with steroid wean 18.  Hypoalbuminemia  Supplement initiated on 10/5  LOS: 9 days A FACE TO FACE EVALUATION WAS PERFORMED  [name] 11/20/2018, 11:37 AM

## 2018-11-21 ENCOUNTER — Inpatient Hospital Stay (HOSPITAL_COMMUNITY): Payer: Medicare Other

## 2018-11-21 ENCOUNTER — Inpatient Hospital Stay (HOSPITAL_COMMUNITY): Payer: No Typology Code available for payment source | Admitting: Occupational Therapy

## 2018-11-21 ENCOUNTER — Inpatient Hospital Stay (HOSPITAL_COMMUNITY): Payer: No Typology Code available for payment source | Admitting: Physical Therapy

## 2018-11-21 LAB — BASIC METABOLIC PANEL
Anion gap: 12 (ref 5–15)
BUN: 48 mg/dL — ABNORMAL HIGH (ref 8–23)
CO2: 25 mmol/L (ref 22–32)
Calcium: 8 mg/dL — ABNORMAL LOW (ref 8.9–10.3)
Chloride: 99 mmol/L (ref 98–111)
Creatinine, Ser: 1.6 mg/dL — ABNORMAL HIGH (ref 0.61–1.24)
GFR calc Af Amer: 46 mL/min — ABNORMAL LOW (ref >60)
GFR calc non Af Amer: 40 mL/min — ABNORMAL LOW (ref >60)
Glucose, Bld: 113 mg/dL — ABNORMAL HIGH (ref 70–99)
Potassium: 3.5 mmol/L (ref 3.5–5.1)
Sodium: 136 mmol/L (ref 135–145)

## 2018-11-21 LAB — CBC WITH DIFFERENTIAL/PLATELET
Abs Immature Granulocytes: 0.3 10*3/uL — ABNORMAL HIGH (ref 0.00–0.07)
Basophils Absolute: 0 10*3/uL (ref 0.0–0.1)
Basophils Relative: 0 %
Eosinophils Absolute: 0 10*3/uL (ref 0.0–0.5)
Eosinophils Relative: 0 %
HCT: 28 % — ABNORMAL LOW (ref 39.0–52.0)
Hemoglobin: 9.5 g/dL — ABNORMAL LOW (ref 13.0–17.0)
Immature Granulocytes: 2 %
Lymphocytes Relative: 3 %
Lymphs Abs: 0.5 10*3/uL — ABNORMAL LOW (ref 0.7–4.0)
MCH: 31.8 pg (ref 26.0–34.0)
MCHC: 33.9 g/dL (ref 30.0–36.0)
MCV: 93.6 fL (ref 80.0–100.0)
Monocytes Absolute: 1.7 10*3/uL — ABNORMAL HIGH (ref 0.1–1.0)
Monocytes Relative: 10 %
Neutro Abs: 14.8 10*3/uL — ABNORMAL HIGH (ref 1.7–7.7)
Neutrophils Relative %: 85 %
Platelets: 214 10*3/uL (ref 150–400)
RBC: 2.99 MIL/uL — ABNORMAL LOW (ref 4.22–5.81)
RDW: 17.1 % — ABNORMAL HIGH (ref 11.5–15.5)
WBC: 17.4 10*3/uL — ABNORMAL HIGH (ref 4.0–10.5)
nRBC: 0.1 % (ref 0.0–0.2)

## 2018-11-21 MED ORDER — FUROSEMIDE 40 MG PO TABS
40.0000 mg | ORAL_TABLET | Freq: Every day | ORAL | Status: DC
  Administered 2018-11-23: 08:00:00 40 mg via ORAL
  Filled 2018-11-21: qty 1

## 2018-11-21 NOTE — Progress Notes (Signed)
Occupational Therapy Session Note  Patient Details  Name: [name] MRN: [id number] Date of Birth: 28-Nov-1940  Today's Date: 11/21/2018 OT Individual Time: [id number] OT Individual Time Calculation (min): 59 min    Short Term Goals: Week 2:  OT Short Term Goal 1 (Week 2): LTG=STG 2/2 ELOS  Skilled Therapeutic Interventions/Progress Updates:    Pt greeted seated in wc with spouse present for family education. Discussed sponge bathing techniques in wc at the sink and at EOB for increased independence with LB ADLs. Educated pt and his spouse on importance of pt doing as much for himself. Pt's spouse stated she was assisting with most all BADLs PTA. Pt completed UB bathing at the sink with set-up A and encouragement. Pt reports not being able to feel wash cloths, but was able to maintain grasp throughout task. Pt then transferred to sitting EOB with close supervision lateral scoot transfer. Pt able to doff pants using lateral leans and reacher. OT issued LH sponge, and pt able to wash LLE today. Lateral leans to wash buttocks and peri-area with supervision. OT reviewed technique for using reacher to thread pant legs, and he demonstrated understanding. Pt was able to remove R LE prosthesis with supervision. Multiple lateral leans to then pull pants up over hips. Pt needed CGA to pull pans up over L hip. OT educated on use of friction reducing bag to assist with donning TED hose. Pt was then able to use reacher to pull shoe over and don shoe. Pt declined to don prosthesis again right now 2/2 soreness. Pt propelled wc to tub room with OT assist at times for time management. Pt completed lateral transfer to tub bench with CGA. Discussed transfer with spouse and had her provide supervision/CGA. Pt returned to room and left seated in wc with spouse present, chair alarm on, and call bell in reach.   Therapy Documentation Precautions:  Precautions Precautions: Fall, Cervical Precaution Comments: R  AKA Required Braces or Orthoses: Cervical Brace Cervical Brace: Soft collar(reviewed cervical precautions) Restrictions Weight Bearing Restrictions: No Pain: Pt reports pain in L leg, but improved with rest. No number given.  Therapy/Group: Individual Therapy  Valma Cava 11/21/2018, 10:42 AM

## 2018-11-21 NOTE — Discharge Summary (Signed)
Physician Discharge Summary  Patient ID: [name] MRN: [id number] DOB/AGE: Jun 22, 1940 78 y.o.  Admit date: 11/11/2018 Discharge date: 11/23/2018  Discharge Diagnoses:  Principal Problem:   Cervical myelopathy ([place]) Active Problems:   Diabetes mellitus type II, controlled ([place])   Myasthenia gravis ([place])   Cervical spinal cord compression ([facility])   CKD (chronic kidney disease), stage III   Unilateral AKA, right ([facility])   Hypoalbuminemia due to protein-calorie malnutrition ([facility])   Steroid-induced hyperglycemia   UTI due to Klebsiella species   Labile blood pressure   Acute on chronic anemia   Neuropathic pain   Chronic myeloid leukemia ([facility])   Benign essential HTN DVT prophylaxis Pain management History of subcortical CVA Neurogenic bowel  Discharged Condition: Stable  Significant Diagnostic Studies: Chest 2 View  Result Date: 11/07/2018 CLINICAL DATA:  Pre op-corpectomy C5, ACDF C3-4 EXAM: CHEST - 2 VIEW COMPARISON:  Chest radiograph 06/27/2017 FINDINGS: Stable cardiomediastinal contours with mildly enlarged heart size. Aortic arch calcification. Stable mildly elevated right hemidiaphragm. The lungs are clear. No pneumothorax or pleural effusion. Degenerative changes are seen in the thoracic spine. The IMPRESSION: No evidence of active disease. Electronically Signed   By: Audie Pinto M.D.   On: 11/07/2018 20:38   Dg Cervical Spine 2-3 Views  Result Date: 11/09/2018 CLINICAL DATA:  Anterior surgical fusion of C3-4 with C5 corpectomy. EXAM: CERVICAL SPINE - 2-3 VIEW; DG C-ARM 1-60 MIN FLUOROSCOPY TIME:  24 seconds. COMPARISON:  CT scan of October 30, 2017. FINDINGS: Three intraoperative fluoroscopic images were obtained of the cervical spine. Patient is status post surgical anterior fusion extending from C3 to C6, with C5 corpectomy. Surgical screws are noted at C3, C4 and C6. IMPRESSION: Fluoroscopic guidance provided during anterior cervical fusion as described above.  Electronically Signed   By: Marijo Conception M.D.   On: 11/09/2018 13:38   Dg C-arm 1-60 Min  Result Date: 11/09/2018 CLINICAL DATA:  Anterior surgical fusion of C3-4 with C5 corpectomy. EXAM: CERVICAL SPINE - 2-3 VIEW; DG C-ARM 1-60 MIN FLUOROSCOPY TIME:  24 seconds. COMPARISON:  CT scan of October 30, 2017. FINDINGS: Three intraoperative fluoroscopic images were obtained of the cervical spine. Patient is status post surgical anterior fusion extending from C3 to C6, with C5 corpectomy. Surgical screws are noted at C3, C4 and C6. IMPRESSION: Fluoroscopic guidance provided during anterior cervical fusion as described above. Electronically Signed   By: Marijo Conception M.D.   On: 11/09/2018 13:38   Dg Hip Unilat With Pelvis 2-3 Views Left  Result Date: 11/21/2018 CLINICAL DATA:  Left hip pain. EXAM: DG HIP (WITH OR WITHOUT PELVIS) 2-3V LEFT COMPARISON:  January 30, 2018 FINDINGS: Atherosclerotic changes seen in the iliac and femoral vessels. Degenerative changes seen in the lower lumbar spine. Mild degenerative changes in the hips. No significant loss of joint space in either hip. No bony lesions or fractures. No dislocations. IMPRESSION: Mild degenerative changes in the hips. No other acute abnormalities. Electronically Signed   By: Dorise Bullion III M.D   On: 11/21/2018 12:50    Labs:  Basic Metabolic Panel: Recent Labs  Lab 11/17/18 0451 11/21/18 1131 11/22/18 0558  NA 138 136 139  K 4.2 3.5 3.9  CL 102 99 99  CO2 27 25 26   GLUCOSE 119* 113* 134*  BUN 36* 48* 50*  CREATININE 1.48* 1.60* 1.61*  CALCIUM 8.3* 8.0* 8.2*    CBC: Recent Labs  Lab 11/21/18 1131 11/22/18 0558  WBC 17.4* 13.4*  NEUTROABS  14.8*  --   HGB 9.5* 9.3*  HCT 28.0* 28.1*  MCV 93.6 94.3  PLT 214 180    CBG: No results for input(s): GLUCAP in the last 168 hours.  Family history.  Mother with CAD and myocardial infarction as well as CVA.  Father with CAD myocardial infarction CVA.  Sister with breast  cancer as well as myocardial infarction and dementia and clotting disorder.  Negative colon cancer  Brief HPI:   [name] is a 78 y.o. right-handed male with history of CAD, CML, myasthenia gravis maintained on Mestinon as well as prednisone, CKD stage III, right AKA 2019 received inpatient rehab services with prosthesis by [employer] [facility] as well as history of pulmonary emboli maintained on Eliquis, subcortical CVA in the past again receiving inpatient rehab services.  Per chart review lives with spouse.  Multilevel home with ramped entrance.  Primarily used a wheelchair for mobility performed lateral scoot transfers with sliding board transfers and to toilet and shower bench.  He had been using his prosthesis but no longer because of poor upper extremity weakness.  Wife did assist with ADLs.  Presented 11/09/2018 with complaints of neck pain, numbness bilateral upper extremities and weakness that progressed over the last several months.  X-rays and imaging revealed herniated nucleus pulposus with spondylosis/stenosis at C3-4 C4-6 with severe myelopathy.  Underwent decompressive anterior cervical discectomy C3-4 with anterior cervical corpectomy anterior cervical arthrodesis 11/09/2018 per Dr. Sherley Bounds.  Hospital course pain management.  Soft collar as directed.  Acute blood loss anemia 9.9.  Decadron protocol as indicated.  Chronic Eliquis resumed 7 days postoperative.  Tolerating a regular diet.  Patient was admitted for a comprehensive rehab program.   Hospital Course: [name] was admitted to rehab 11/11/2018 for inpatient therapies to consist of PT, ST and OT at least three hours five days a week. Past admission physiatrist, therapy team and rehab RN have worked together to provide customized collaborative inpatient rehab.  Pertaining to patient's cervical stenosis myelopathy he had undergone C3-4 anterior cervical corpectomy arthrodesis C3-4, 4-5 and 5-6 11/09/2018 per Dr. Sherley Bounds soft  collar as directed.  He was weaned from Decadron.  Chronic Eliquis was resumed 7 days postoperative no bleeding episodes.  Pain management use of oxycodone and Robaxin as needed the addition of lidocaine cream for hands bilaterally 4 times daily his Neurontin was increased to 300 mg 3 times daily.  Left lower extremity pain felt to be radicular he has severe multifactorial multilevel stenosis on MRI from July 2020.  Mood stabilization with emotional support provided.  Acute on chronic anemia hemoglobin 8.2 Hemoccult negative.  Chronic myeloid leukemia follow-up outpatient his home gleevac had been resumed.  CKD stage III creatinine 1.48 remaining stable follow-up PCP.  History of right AKA he did receive inpatient rehab services in the past prosthesis provided by [employer] [facility].  Blood pressures controlled on Lasix as well as Imdur no orthostasis noted.  He did have Klebsiella UTI completed a 7-day course of Cipro.  Steroid-induced hyperglycemia and monitored with weaning of steroids.   Blood pressures were monitored on TID basis and stable  Diabetes has been monitored with ac/hs CBG checks and SSI was use prn for tighter BS control.   He/ is continent of bowel and bladder.  He/ has made gains during rehab stay and is attending therapies  He/ will continue to receive follow up therapies after discharge  Rehab course: During patient's stay in rehab weekly team conferences were  held to monitor patient's progress, set goals and discuss barriers to discharge. At admission, patient required minimal assist lateral scoot transfers, moderate assist side-lying to sitting, minimal guard for rolling.  Moderate assist upper body bathing, max is lower body bathing, moderate assist upper body dressing, max assist lower body dressing, minimal assist toilet transfers  Physical exam.  Blood pressure 144/52 pulse 89 temperature 98.1 respirations 18 oxygen saturation 96% room air Constitutional well-developed no  distress HEENT.  Atraumatic Eyes.  Pupils round and reactive to light no discharge without nystagmus Neck.  Soft collar in place Cardiovascular normal rate regular rhythm normal heart sounds no friction rub or murmur heard Respiratory.  Effort normal no stridor no respiratory distress no wheezes no rails GI.  Soft exhibits no distention nontender positive bowel sounds without rebound Genitourinary Foley tube initially in place Neurological.  Alert oriented fair insight and awareness upper extremities 4- out of 5 deltoids biceps triceps 3 out of 5 wrist hand.  Right lower extremity 4 out of 5 hip flexors, left lower extremity 4- out of 5 hip flexor, knee extension and 4 out of 5 ankle dorsi plantarflexion.  Decreased light touch and pain bilateral hands below distal forearm DTRs 1+ Skin.  No rash noted surgical site clean and dry right AKA stump well-healed  He/  has had improvement in activity tolerance, balance, postural control as well as ability to compensate for deficits. He/ has had improvement in functional use RUE/LUE  and RLE/LLE as well as improvement in awareness.  Working with energy conservation techniques patient making steady progress.  Consistently performing bed mobility with minimal assist, squat pivot transfers contact-guard to supervision and stand pivot transfers with rolling walker right lower extremity prosthesis contact-guard to minimal assist.  Demonstrates deficits in muscle weakness muscle joint tightness decreased cardiorespiratory endurance and decreased standing balance.  He can don his shirt and right lower extremity prosthesis in supine with total assist for time management.  Supine to sit with minimal assist and contact-guard squat pivot to wheelchair.  Supervision to perform teeth brushing and washing face at sink.  Patient self propelled wheelchair to and from therapy gym modified independent.  Perform a block practice of sit to stand with rolling walker, from both mat  and wheelchair and stand pivot transfers with rolling walker.  Contact-guard minimal assist overall good carryover from previous sessions.  Ambulates 10 feet min mod assist with prosthesis.  Completed lateral transfers to tub bench with contact-guard assist.  Full teaching completed with family plan discharge to home       Disposition: Discharge disposition: 01-Home or Self Care     Discharge to home   Diet: Regular  Special Instructions: No driving smoking or alcohol  Medications at discharge 1.  Tylenol as needed 2.  Eliquis 2.5 mg p.o. twice daily 3.  Alphagan ophthalmic solution 0.2% 1 drop both eyes twice daily 4.  Lasix 40 mg daily 5.  Neurontin 300 mg p.o. 3 times daily 6.Gleevac 300 mg daily 7.  Imdur 30 mg p.o. daily 8.  Robaxin 500 mg every 6 as needed muscle spasms 9.  Multivitamin daily 10.  Oxycodone 5 mg every 3 as needed pain 11.  Prednisone 1 mg p.o. daily 12.  Mestinon 60 mg p.o. daily 13.  Senokot 1 tablet p.o. twice daily 14.  Timoptic 0.5% 1 drop both eyes twice daily 15.  Crestor 5 mg daily  Discharge Instructions    Ambulatory referral to Physical Medicine Rehab   Complete by:  As directed    Moderate complexity follow-up 1 to 2 weeks cervical myelopathy      Follow-up Information    Meredith Staggers, MD Follow up.   Specialty: Physical Medicine and Rehabilitation Why: As directed Contact information: [address] [place] [address] [phone]        [name], MD Follow up.   Specialty: Neurosurgery Why: Call for appointment Contact information: [address] [place] [address] [phone]           Signed: Cathlyn Parsons 11/23/2018, 5:34 AM

## 2018-11-21 NOTE — Progress Notes (Signed)
Physical Therapy Weekly Progress Note  Patient Details  Name: [name] MRN: [id number] Date of Birth: [birth year]  Beginning of progress report period: November 12, 2018 End of progress report period: November 21, 2018  Today's Date: 11/21/2018 PT Individual Time: 0800-0900 PT Individual Time Calculation (min): 60 min   Patient has met 3 of 6 long term goals. Pt is making good progress towards LTGs. He is consistently performing bed mobility w/ min assist, squat pivot transfers w/ CGA-S, and stand pivot transfers w/ RW and RLE prosthesis w/ CGA-min assist. He continues to be limited by L groin pain w/ all movements, however able to push through and is very motivated.   Patient continues to demonstrate the following deficits muscle weakness and muscle joint tightness, decreased cardiorespiratoy endurance and decreased standing balance, decreased postural control and decreased balance strategies and therefore will continue to benefit from skilled PT intervention to increase functional independence with mobility.  Patient progressing toward long term goals..  Continue plan of care. W/c mobility goals completed, sit<>stand goal upgraded to CGA. Bed mobility goal downgraded to CGA.   PT Short Term Goals Week 1:  PT Short Term Goal 1 (Week 1): =LTG due to ELOS Week 2:  PT Short Term Goal 1 (Week 2): =LTGs due to ELOS  Skilled Therapeutic Interventions/Progress Updates:   Pt in supine and agreeable to therapy, pain 4/10 in L groin, premedicated. Donned shorts and RLE prosthesis in supine w/ total assist for time management. Supine>sit w/ min assist and CGA squat pivot to w/c. Supervision to perform teeth brushing and washing face at sink. Pt self-propelled w/c to/from therapy gym, mod/i, w/ BUEs.  Performed blocked practice of sit<>stands to RW, from both mat and w/c, and stand pivot transfers w/ RW. CGA-min assist overall, good carryover from last week's sessions. Performed stand pivots x3 in  each direction. Very few verbal and tactile cues for upright posture, RW management, and LE step placement. Practiced gait w/ RW and RLE prosthesis, min-mod assist 10'. Needed prolonged seated rest break after this walk, pt performed BUE fine motor tasks during seated rest break including pipe tree and peg board. Pt very discouraged that he had trouble standing after walk. Pt stood to stand pivot w/ CGA-min assist after this longer rest break to demonstrate that w/ energy conservation and pacing, he can do it.   Returned to room and ended session in w/c, all needs in reach.   Therapy Documentation Precautions:  Precautions Precautions: Fall, Cervical Precaution Comments: R AKA Required Braces or Orthoses: Cervical Brace Cervical Brace: Soft collar(reviewed cervical precautions) Restrictions Weight Bearing Restrictions: No Vital Signs: Therapy Vitals Temp: 98.4 F (36.9 C) Pulse Rate: 62 Resp: 18 BP: 138/66 Patient Position (if appropriate): Sitting Oxygen Therapy SpO2: 100 % O2 Device: Room Air Pain: Pain Assessment Pain Scale: 0-10 Pain Score: 4  Pain Type: Acute pain Pain Location: Leg Pain Orientation: Left Pain Descriptors / Indicators: Aching Pain Frequency: Intermittent Pain Intervention(s): Medication (See eMAR) Multiple Pain Sites: No  Therapy/Group: Individual Therapy  Simmie Camerer Clent Demark 11/21/2018, 9:14 AM

## 2018-11-21 NOTE — Progress Notes (Signed)
Slept good. PRN oxy IR given at 2117 for LLE pain/spasms. PRN robaxin and tylenol given at 0226. Patient frustrated, dropped drink-"My grips are weak." [name] A

## 2018-11-21 NOTE — Progress Notes (Signed)
[facility] PHYSICAL MEDICINE & REHABILITATION PROGRESS NOTE   Subjective/Complaints: Ongoing pain in his left medial-anterior thigh. Weak with hip flexion too. Leg is tender with weight bearing as well as with flexion.  ROS: Patient denies fever, rash, sore throat, blurred vision, nausea, vomiting, diarrhea, cough, shortness of breath or chest pain,  headache, or mood change.     Objective:   No results found. No results for input(s): WBC, HGB, HCT, PLT in the last 72 hours. No results for input(s): NA, K, CL, CO2, GLUCOSE, BUN, CREATININE, CALCIUM in the last 72 hours.  Intake/Output Summary (Last 24 hours) at 11/21/2018 1014 Last data filed at 11/21/2018 0534 Gross per 24 hour  Intake 480 ml  Output 1000 ml  Net -520 ml     Physical Exam: Vital Signs Blood pressure 138/66, pulse 62, temperature 98.4 F (36.9 C), resp. rate 18, height 5\' 6"  (1.676 m), weight 68 kg, SpO2 100 %.  Constitutional: No distress . Vital signs reviewed. HEENT: EOMI, oral membranes moist Neck: supple Cardiovascular: RRR without murmur. No JVD    Respiratory: CTA Bilaterally without wheezes or rales. Normal effort    GI: BS +, non-tender, non-distended  Skin: Neck incision with dressing C/D/I Psych: Normal mood.  Normal behavior. Musc: Right AKA Left lower extremity edema. Left hip adductors and hip flexors tender with palpation. Could not HF against gravity. No pain with HAD. Pain with palpation near groin, minimal with lateral palpation. Right leg in AK prosthesis Neuro: Alert Motor: Bilateral upper extremities: 4/5 proximal to 3+ to 4- distal with sensory changes. Left lower extremity: Hip flexion 2/5 3/5, knee extension, distally 4/5 (pain inhibition),    Assessment/Plan: 1. Functional deficits secondary to cervical stenosis/HNP/severe myelopathy which require 3+ hours per day of interdisciplinary therapy in a comprehensive inpatient rehab setting.  Physiatrist is providing close team  supervision and 24 hour management of active medical problems listed below.  Physiatrist and rehab team continue to assess barriers to discharge/monitor patient progress toward functional and medical goals  Care Tool:  Bathing    Body parts bathed by patient: Right arm, Left arm, Chest, Abdomen, Front perineal area, Buttocks, Right upper leg, Face, Left upper leg   Body parts bathed by helper: Left lower leg Body parts n/a: Right lower leg   Bathing assist Assist Level: Minimal Assistance - Patient > 75%     Upper Body Dressing/Undressing Upper body dressing   What is the patient wearing?: Pull over shirt    Upper body assist Assist Level: Minimal Assistance - Patient > 75%    Lower Body Dressing/Undressing Lower body dressing      What is the patient wearing?: Pants     Lower body assist Assist for lower body dressing: Minimal Assistance - Patient > 75%     Toileting Toileting Toileting Activity did not occur (Clothing management and hygiene only): N/A (no void or bm)(used urinal, no BM)  Toileting assist Assist for toileting: Minimal Assistance - Patient > 75%     Transfers Chair/bed transfer  Transfers assist     Chair/bed transfer assist level: Minimal Assistance - Patient > 75% Chair/bed transfer assistive device: Armrests, Walker   Locomotion Ambulation   Ambulation assist   Ambulation activity did not occur: Safety/medical concerns  Assist level: Moderate Assistance - Patient 50 - 74% Assistive device: Walker-rolling Max distance: 10'   Walk 10 feet activity   Assist  Walk 10 feet activity did not occur: Safety/medical concerns  Assist level: Moderate Assistance -  Patient - 50 - 74% Assistive device: Walker-rolling   Walk 50 feet activity   Assist Walk 50 feet with 2 turns activity did not occur: Safety/medical concerns         Walk 150 feet activity   Assist Walk 150 feet activity did not occur: Safety/medical concerns          Walk 10 feet on uneven surface  activity   Assist Walk 10 feet on uneven surfaces activity did not occur: Safety/medical concerns         Wheelchair     Assist Will patient use wheelchair at discharge?: Yes Type of Wheelchair: Manual    Wheelchair assist level: Independent Max wheelchair distance: 150'    Wheelchair 50 feet with 2 turns activity    Assist        Assist Level: Independent   Wheelchair 150 feet activity     Assist  Wheelchair 150 feet activity did not occur: Safety/medical concerns   Assist Level: Independent   Blood pressure 138/66, pulse 62, temperature 98.4 F (36.9 C), resp. rate 18, height 5\' 6"  (1.676 m), weight 68 kg, SpO2 100 %.  Medical Problem List and Plan: 1.Decreased functional ability with upper extremity weaknesssecondary to cervical stenosis/HNP/severe myelopathy. Status post C3-4 anterior cervical corpectomy andAnterior cervical arthrodesis C3-4 4-5 and 5-6 11/09/2018.  Continue CIR  Soft collar as directed  Decadron wean:  2 mg every 8 on 10/7, now at 1 mg every 8 hours 2. Antithrombotics: -DVT/anticoagulation:Chronic Eliquis on hold x7 days from day of surgery, restarted 2.5 twice daily on 10/7 -antiplatelet therapy: N/A 3. Pain Management:Oxycodone and Robaxin as needed  Added Lidocaine cream for hands B/L QID   Gabapentin increased to 200 3 times daily on 10/5 with benefit, will attempt to avoid further increase at this time  Left lower extremity pain may be radicular. He has severe multifactorial and multi-level stenosis on MRI from 08/2018. However nerve root impingment doesn't really match pain per se as the level involved most significantly on the left is L4.  -xray from 12/19 with mild OA but not really focused on hip. Extensive atherosclerotic disease on xr   -order dedicated hip films today  -continue K-tape 4. Mood:Provide emotional support -antipsychotic agents: N/A 5.  Neuropsych: This patientiscapable of making decisions on hisown behalf. 6. Skin/Wound Care:Routine skin checks 7. Fluids/Electrolytes/Nutrition:Routine in and outs 8. Acute on chronic anemia.   Hemoglobin 8.2 on 10/7, labs pending for today--  Hemoccult ordered and negative 9.Chronic myeloid leukemia. Follow-up outpatient  Home Gleevec restarted on 10/8 10. CKD stage III. Creatinine baseline 1.63-1.79  Creatinine 1.48 on 10/8, labs pending  Encourage fluids  Continue to monitor 11. 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 Patient did receive inpatient rehab services 13. Myasthenia gravis. Continue Mestinon and low-dose chronic prednisone 14. Hypertension.   Lasix 40 mg daily, extra dose ordered on 10/9  Imdur 30 mg daily.   Improved 10/12 15.  Klebsiella UTI   Cipro started on 10/5, 7-day course complete 16. Neurogenic bowel:  Increase bowel meds as needed 17.  Steroid-induced hyperglycemia  Continue to monitor with steroid wean 18.  Hypoalbuminemia  Supplement initiated on 10/5  LOS: 10 days A FACE TO FACE EVALUATION WAS PERFORMED  Meredith Staggers 11/21/2018, 10:14 AM

## 2018-11-21 NOTE — Plan of Care (Signed)
  Problem: RH Bed Mobility Goal: LTG Patient will perform bed mobility with assist (PT) Description: LTG: Patient will perform bed mobility with assistance, with/without cues (PT). Flowsheets (Taken 11/21/2018 0914) LTG: Pt will perform bed mobility with assistance level of: (downgraded 10/12 due to lack of progress - AAT) Contact Guard/Touching assist Note: Downgraded 10/12 due to lack of progress - AAT    Problem: RH Wheelchair Mobility Goal: LTG Patient will propel w/c in controlled environment (PT) Description: LTG: Patient will propel wheelchair in controlled environment, # of feet with assist (PT) Outcome: Completed/Met Goal: LTG Patient will propel w/c in home environment (PT) Description: LTG: Patient will propel wheelchair in home environment, # of feet with assistance (PT). Outcome: Completed/Met   Problem: Sit to Stand Goal: LTG:  Patient will perform sit to stand with assistance level (PT) Description: LTG:  Patient will perform sit to stand with assistance level (PT) Flowsheets (Taken 11/21/2018 0843) LTG: PT will perform sit to stand in preparation for functional mobility with assistance level: (goal upgraded 10/12 due to pt progress - AAT) Contact Guard/Touching assist Note: Goal upgraded 10/12 due to pt progress

## 2018-11-21 NOTE — Plan of Care (Deleted)
  Problem: RH Wheelchair Mobility Goal: LTG Patient will propel w/c in controlled environment (PT) Description: LTG: Patient will propel wheelchair in controlled environment, # of feet with assist (PT) Outcome: Completed/Met Goal: LTG Patient will propel w/c in home environment (PT) Description: LTG: Patient will propel wheelchair in home environment, # of feet with assistance (PT). Outcome: Completed/Met   Problem: Sit to Stand Goal: LTG:  Patient will perform sit to stand with assistance level (PT) Description: LTG:  Patient will perform sit to stand with assistance level (PT) Flowsheets (Taken 11/21/2018 0843) LTG: PT will perform sit to stand in preparation for functional mobility with assistance level: (goal upgraded 10/12 due to pt progress - AAT) Contact Guard/Touching assist Note: Goal upgraded 10/12 due to pt progress

## 2018-11-21 NOTE — Progress Notes (Signed)
Occupational Therapy Session Note  Patient Details  Name: [name] MRN: [id number] Date of Birth: 20-Oct-1940  Today's Date: 11/21/2018 OT Individual Time: 1300-1410 OT Individual Time Calculation (min): 70 min    Short Term Goals: Week 1:  OT Short Term Goal 1 (Week 1): Pt will manage 1 container during meals with min A OT Short Term Goal 1 - Progress (Week 1): Met OT Short Term Goal 2 (Week 1): Pt will don pants with lateral leans with mod A OT Short Term Goal 2 - Progress (Week 1): Met OT Short Term Goal 3 (Week 1): Pt will complete BSC transfer with CGA OT Short Term Goal 3 - Progress (Week 1): Progressing toward goal  Skilled Therapeutic Interventions/Progress Updates:    Patient supine in bed, alert and ready for therapy.  Wife present. Left 4th digit with "trigger finger"  - small extension splint with coban buddy tape to middle finger effective for pain control and not limiting ability to grasp and release objects with left hand - reviewed placement and monitoring with wife.  No redness or irritation after 60 minutes of wear.  Supine to/from SSP with min A.  Able to donn prosthesis with min A.  Sit to stand from elevated bed min A.  SPT with RW min A.  Able to propel w/c to/from therapy gym with rest breaks.  Had to doff prosthesis after approx 10 minutes due to pressure at groin in seated position in w/c.  W/c push ups completed from elevated seat position with good results and no pain at left hip.  Completed core mobility/abdominal exercises, distal hand and wrist exercises with rest breaks between.  Returned to room, remained seated in w/c with call bell and tray table in reach.    Therapy Documentation Precautions:  Precautions Precautions: Fall, Cervical Precaution Comments: R AKA Required Braces or Orthoses: Cervical Brace Cervical Brace: Soft collar(reviewed cervical precautions) Restrictions Weight Bearing Restrictions: No General:   Vital Signs:   Pain: Pain  Assessment Pain Scale: 0-10 Pain Score: 4  Pain Location: Hip Pain Orientation: Left Pain Descriptors / Indicators: Tender   Other Treatments:     Therapy/Group: Individual Therapy  Carlos Levering 11/21/2018, 3:54 PM

## 2018-11-22 ENCOUNTER — Inpatient Hospital Stay (HOSPITAL_COMMUNITY): Payer: No Typology Code available for payment source

## 2018-11-22 ENCOUNTER — Inpatient Hospital Stay (HOSPITAL_COMMUNITY): Payer: No Typology Code available for payment source | Admitting: Occupational Therapy

## 2018-11-22 ENCOUNTER — Inpatient Hospital Stay (HOSPITAL_COMMUNITY): Payer: No Typology Code available for payment source | Admitting: Physical Therapy

## 2018-11-22 LAB — BASIC METABOLIC PANEL
Anion gap: 14 (ref 5–15)
BUN: 50 mg/dL — ABNORMAL HIGH (ref 8–23)
CO2: 26 mmol/L (ref 22–32)
Calcium: 8.2 mg/dL — ABNORMAL LOW (ref 8.9–10.3)
Chloride: 99 mmol/L (ref 98–111)
Creatinine, Ser: 1.61 mg/dL — ABNORMAL HIGH (ref 0.61–1.24)
GFR calc Af Amer: 46 mL/min — ABNORMAL LOW (ref >60)
GFR calc non Af Amer: 40 mL/min — ABNORMAL LOW (ref >60)
Glucose, Bld: 134 mg/dL — ABNORMAL HIGH (ref 70–99)
Potassium: 3.9 mmol/L (ref 3.5–5.1)
Sodium: 139 mmol/L (ref 135–145)

## 2018-11-22 LAB — CBC
HCT: 28.1 % — ABNORMAL LOW (ref 39.0–52.0)
Hemoglobin: 9.3 g/dL — ABNORMAL LOW (ref 13.0–17.0)
MCH: 31.2 pg (ref 26.0–34.0)
MCHC: 33.1 g/dL (ref 30.0–36.0)
MCV: 94.3 fL (ref 80.0–100.0)
Platelets: 180 10*3/uL (ref 150–400)
RBC: 2.98 MIL/uL — ABNORMAL LOW (ref 4.22–5.81)
RDW: 16.9 % — ABNORMAL HIGH (ref 11.5–15.5)
WBC: 13.4 10*3/uL — ABNORMAL HIGH (ref 4.0–10.5)
nRBC: 0.1 % (ref 0.0–0.2)

## 2018-11-22 MED ORDER — GABAPENTIN 300 MG PO CAPS
300.0000 mg | ORAL_CAPSULE | Freq: Three times a day (TID) | ORAL | Status: DC
  Administered 2018-11-22 – 2018-11-23 (×3): 300 mg via ORAL
  Filled 2018-11-22 (×3): qty 1

## 2018-11-22 MED ORDER — APIXABAN 2.5 MG PO TABS: 2.5000 mg | ORAL_TABLET | Freq: Two times a day (BID) | ORAL | 1 refills | Status: AC

## 2018-11-22 MED ORDER — SENNA 8.6 MG PO TABS: 1.0000 | ORAL_TABLET | Freq: Two times a day (BID) | ORAL | 0 refills | Status: DC

## 2018-11-22 MED ORDER — GABAPENTIN 300 MG PO CAPS: 300.0000 mg | ORAL_CAPSULE | Freq: Three times a day (TID) | ORAL | 1 refills | Status: AC

## 2018-11-22 MED ORDER — ISOSORBIDE MONONITRATE ER 30 MG PO TB24: 30.0000 mg | ORAL_TABLET | Freq: Every day | ORAL | 3 refills | Status: AC

## 2018-11-22 MED ORDER — LIDOCAINE 4 % EX CREA: TOPICAL_CREAM | Freq: Four times a day (QID) | CUTANEOUS | 0 refills | Status: AC

## 2018-11-22 MED ORDER — METHOCARBAMOL 500 MG PO TABS: 500.0000 mg | ORAL_TABLET | Freq: Four times a day (QID) | ORAL | 0 refills | Status: DC | PRN

## 2018-11-22 MED ORDER — PREDNISONE 1 MG PO TABS: 1.0000 mg | ORAL_TABLET | Freq: Every day | ORAL | 0 refills | Status: DC

## 2018-11-22 MED ORDER — FUROSEMIDE 40 MG PO TABS: 40.0000 mg | ORAL_TABLET | Freq: Every day | ORAL | 1 refills | Status: AC

## 2018-11-22 MED ORDER — DEXAMETHASONE 2 MG PO TABS
1.0000 mg | ORAL_TABLET | Freq: Two times a day (BID) | ORAL | Status: DC
  Administered 2018-11-22 – 2018-11-23 (×2): 1 mg via ORAL
  Filled 2018-11-22 (×2): qty 1

## 2018-11-22 MED ORDER — INFLUENZA VAC A&B SA ADJ QUAD 0.5 ML IM PRSY
0.5000 mL | PREFILLED_SYRINGE | INTRAMUSCULAR | Status: AC
  Administered 2018-11-22: 12:00:00 0.5 mL via INTRAMUSCULAR
  Filled 2018-11-22: qty 0.5

## 2018-11-22 MED ORDER — DICLOFENAC SODIUM 1 % TD GEL: 2.0000 g | Freq: Three times a day (TID) | TRANSDERMAL | 0 refills | Status: AC | PRN

## 2018-11-22 MED ORDER — ACETAMINOPHEN 325 MG PO TABS: 650.0000 mg | ORAL_TABLET | ORAL | Status: AC | PRN

## 2018-11-22 MED ORDER — OXYCODONE HCL 5 MG PO TABS: 5.0000 mg | ORAL_TABLET | ORAL | 0 refills | Status: DC | PRN

## 2018-11-22 NOTE — Progress Notes (Signed)
Occupational Therapy Discharge Summary  Patient Details  Name: [name] MRN: [id number] Date of Birth: 20-Nov-1940  Today's Date: 11/22/2018 OT Individual Time: 0321-2248 OT Individual Time Calculation (min): 45 min   Pt greeted seated in wc and agreeable to OT treatment session. Pt completed squat-pivot transfer to EOB with supervision. Practice donning/doffing shorts using reacher and lateral leans. Pt able to doff pants with supervision, but needed OT assistance to pull pants over L hip today despite effort 2/2 grip deficits. Squat-pivot back to wc w/ supervision. Pt propelled wc to therapy gym with supervision. OT provided pt with home fine motor program and theraputty   Patient has met 5 of 9 long term goals due to improved activity tolerance, improved balance, postural control, ability to compensate for deficits, functional use of  RIGHT upper, LEFT upper and LEFT lower extremity and improved coordination.  Patient to discharge at [facility] A/ Supervision level.  Patient's care partner is independent to provide the necessary physical assistance at discharge.    Reasons goals not met:  Pt needs min/CGA for pulling pants up using lateral leans 2/2 functional grasp deficits-ESD Pt is able to complete peri-care and doff pants, but needs CGA/min A to pull pants back up over hips Pt needs CGA for shower transfers in simulated home environment 2/2 placement of slideboard to transfer from commode to tub bench.  Recommendation:  Patient will benefit from ongoing skilled OT services in home health setting to continue to advance functional skills in the area of BADL and Reduce care partner burden.  Equipment: No equipment provided-pt has all needed equipment   Reasons for discharge: treatment goals met and discharge from hospital  Patient/family agrees with progress made and goals achieved: Yes  OT Discharge Precautions/Restrictions  Precautions Precautions: Fall;Cervical Precaution  Booklet Issued: Yes (comment) Required Braces or Orthoses: Cervical Brace Cervical Brace: Soft collar Restrictions Weight Bearing Restrictions: No Pain  Pt reports L leg pain, no number given. Improved with repositioning  ADL ADL Equipment Provided: Reacher, Long-handled sponge Eating: Supervision/safety, Set up Grooming: Supervision/safety, Setup Upper Body Bathing: Supervision/safety, Setup Lower Body Bathing: Supervision/safety, Setup Upper Body Dressing: Setup, Supervision/safety Lower Body Dressing: Minimal assistance, Contact guard Toileting: Minimal assistance, Contact guard Toilet Transfer: Close supervision Tub/Shower Transfer: Contact guard Perception  Perception: Within Functional Limits Praxis Praxis: Intact Cognition Overall Cognitive Status: Within Functional Limits for tasks assessed Arousal/Alertness: Awake/alert Orientation Level: Oriented X4 Attention: Selective Selective Attention: Appears intact Memory: Appears intact Immediate Memory Recall: Sock;Bed;Blue Memory Recall Sock: Not able to recall Memory Recall Blue: Without Cue Memory Recall Bed: Without Cue Awareness: Appears intact Problem Solving: Appears intact Behaviors: Lability Safety/Judgment: Appears intact Sensation Sensation Light Touch: Impaired Detail Central sensation comments: Numbness and tinging in B hands Light Touch Impaired Details: Impaired RUE;Impaired LUE;Impaired LLE(L medial thigh sensitive to touch) Proprioception: Appears Intact Coordination Gross Motor Movements are Fluid and Coordinated: No Fine Motor Movements are Fluid and Coordinated: No Coordination and Movement Description: impaired 2/2 pain and weakness Motor  Motor Motor: Other (comment) Motor - Skilled Clinical Observations: generalized weakness Mobility  Bed Mobility Bed Mobility: Rolling Right;Rolling Left;Right Sidelying to Sit  Trunk/Postural Assessment  Postural Control Righting Reactions: delayed   Balance Balance Balance Assessed: Yes Static Sitting Balance Static Sitting - Level of Assistance: 5: Stand by assistance Dynamic Sitting Balance Dynamic Sitting - Level of Assistance: 5: Stand by assistance Static Standing Balance Static Standing - Level of Assistance: 4: Min assist Extremity/Trunk Assessment RUE Assessment RUE Assessment: Exceptions  to [facility] General Strength Comments: 4/5 LUE Assessment LUE Assessment: Exceptions to [facility] Active Range of Motion (AROM) Comments: 4th digit trigger finger General Strength Comments: 4-/5 LUE AROM (degrees) Left Shoulder Flexion: 80 Degrees Left Shoulder ABduction: 75 Degrees   Daneen Schick Van Se[place]ore 11/22/2018, 3:10 PM

## 2018-11-22 NOTE — Progress Notes (Signed)
Physical Therapy Discharge Summary  Patient Details  Name: [name] MRN: [id number] Date of Birth: November 02, 1940  Today's Date: 11/23/2018 PT Individual Time: 1100-1125 PT Individual Time Calculation (min): 25 min   Pt received in w/c, ready for d/c. Total assist to don RLE prosthesis in sitting. Applied pink k-tape to adductor musculature w/ medial epicondyle anchor and 50% pull towards adductor origin. Total assist w/c transport out to pt's car. Educated wife and pt's son on min assist stand pivot car transfer w/ RW and RLE prosthesis. Both son and pt's wife state this was much easier than how pt transferred into car PTA. Pt's son provides min assist to boost pt back into car seat appropriately. Ended session in car and ready for d/c.   Patient has met 5 of 6 long term goals due to improved activity tolerance, improved balance, improved postural control, increased strength, decreased pain, ability to compensate for deficits and functional use of  right upper extremity, left upper extremity and left lower extremity.  Patient to discharge at a wheelchair level Modified Independent and supervision squat pivot transfers. Patient's care partner is independent to provide the necessary physical assistance at discharge. Pt's wife participated in hands-on training for squat pivot transfers and wife also educated on stand pivot transfers w/ RW, see details above.   Reasons goals not met: Pt continues to require min assist for bed mobility 2/2 decreased UE and trunk strength.   Recommendation:  Patient will benefit from ongoing skilled PT services in home health setting to continue to advance safe functional mobility, address ongoing impairments in functional balance, functional strength, endurance, and new RLE prosthetic training, and minimize fall risk.  Equipment: No equipment provided, pt all DME from previous admissions   Reasons for discharge: treatment goals met and discharge from  hospital  Patient/family agrees with progress made and goals achieved: Yes  PT Discharge Precautions/Restrictions Precautions Precautions: Fall;Cervical Precaution Booklet Issued: Yes (comment) Required Braces or Orthoses: Cervical Brace Cervical Brace: Soft collar Restrictions Weight Bearing Restrictions: No Pain Pain Assessment Pain Scale: 0-10 Pain Score: 0-No pain Vision/Perception  Perception Perception: Within Functional Limits Praxis Praxis: Intact  Cognition Overall Cognitive Status: Within Functional Limits for tasks assessed Arousal/Alertness: Awake/alert Orientation Level: Oriented X4 Attention: Selective Selective Attention: Appears intact Memory: Appears intact Immediate Memory Recall: Sock;Bed;Blue Memory Recall Sock: Not able to recall Memory Recall Blue: Without Cue Memory Recall Bed: Without Cue Awareness: Appears intact Problem Solving: Appears intact Behaviors: Lability Safety/Judgment: Appears intact Sensation Sensation Light Touch: Impaired Detail Central sensation comments: Numbness and tinging in B hands Light Touch Impaired Details: Impaired RUE;Impaired LUE;Impaired LLE(L medial thigh sensitive to touch) Proprioception: Appears Intact Coordination Gross Motor Movements are Fluid and Coordinated: No Fine Motor Movements are Fluid and Coordinated: No Coordination and Movement Description: impaired 2/2 pain and weakness Motor  Motor Motor: Other (comment) Motor - Skilled Clinical Observations: generalized weakness  Mobility Bed Mobility Bed Mobility: Rolling Right;Rolling Left;Supine to Sit;Sit to Supine Rolling Right: Minimal Assistance - Patient > 75% Rolling Left: Minimal Assistance - Patient > 75% Supine to Sit: Minimal Assistance - Patient > 75% Sit to Supine: Minimal Assistance - Patient > 75% Transfers Transfers: Sit to Stand;Stand to Sit;Stand Pivot Transfers;Squat Pivot Transfers Sit to Stand: Supervision/Verbal cueing Stand  to Sit: Supervision/Verbal cueing Stand Pivot Transfers: Contact Guard/Touching assist Squat Pivot Transfers: Supervision/Verbal cueing Transfer (Assistive device): None(none for squat pivot transfers, RW for stand pivot transfers) Locomotion  Gait Ambulation: Yes Gait Assistance: Moderate Assistance -  Patient 50-74% Gait Distance (Feet): 10 Feet Assistive device: Rolling walker(RLE prosthesis) Gait Assistance Details: Manual facilitation for weight shifting;Verbal cues for gait pattern;Verbal cues for safe use of DME/AE;Manual facilitation for placement;Manual facilitation for weight bearing;Tactile cues for posture Wheelchair Mobility Wheelchair Mobility: Yes Wheelchair Assistance: Independent with assistive device Wheelchair Propulsion: Both upper extremities;Left lower extremity Wheelchair Parts Management: Independent  Trunk/Postural Assessment  Cervical Assessment Cervical Assessment: Exceptions to WFL(limited 2/2 cervical precautions, soft collar when OOB) Thoracic Assessment Thoracic Assessment: Within Functional Limits Lumbar Assessment Lumbar Assessment: Exceptions to WFL(posterior pelvic tilt) Postural Control Postural Control: Deficits on evaluation Righting Reactions: delayed /insufficient  Balance Balance Balance Assessed: Yes Static Sitting Balance Static Sitting - Level of Assistance: 5: Stand by assistance Dynamic Sitting Balance Dynamic Sitting - Level of Assistance: 5: Stand by assistance Static Standing Balance Static Standing - Level of Assistance: 4: Min assist Extremity Assessment  RUE Assessment RUE Assessment: Exceptions to [facility] General Strength Comments: 4/5 LUE Assessment LUE Assessment: Exceptions to [facility] Active Range of Motion (AROM) Comments: 4th digit trigger finger General Strength Comments: 4-/5 LUE AROM (degrees) Left Shoulder Flexion: 80 Degrees Left Shoulder ABduction: 75 Degrees RLE Assessment RLE Assessment: Exceptions to [facility] Passive  Range of Motion (PROM) Comments: Limited globally 2/2 stiffness General Strength Comments: Globally 4/5 LLE Assessment LLE Assessment: Exceptions to [facility] Passive Range of Motion (PROM) Comments: Knee and ankle ROM WFL and hip limited by adductor/groin pain globally     Erek Kowal K Krupa Stege 11/22/2018, 6:09 PM

## 2018-11-22 NOTE — Plan of Care (Signed)
  Problem: SCI BOWEL ELIMINATION Goal: RH STG MANAGE BOWEL WITH ASSISTANCE Description: STG Manage Bowel with mod Assistance. Outcome: Progressing Goal: RH STG SCI MANAGE BOWEL WITH MEDICATION WITH ASSISTANCE Description: STG SCI Manage bowel with medication with min assistance. Outcome: Progressing Goal: RH STG SCI MANAGE BOWEL PROGRAM W/ASSIST OR AS APPROPRIATE Description: STG SCI Manage bowel program w/mod assist or as appropriate. Outcome: Progressing   Problem: SCI BLADDER ELIMINATION Goal: RH STG MANAGE BLADDER WITH ASSISTANCE Description: STG Manage Bladder With min Assistance Outcome: Progressing   Problem: RH SKIN INTEGRITY Goal: RH STG SKIN FREE OF INFECTION/BREAKDOWN Description: While on rehab Outcome: Progressing Goal: RH STG MAINTAIN SKIN INTEGRITY WITH ASSISTANCE Description: STG Maintain Skin Integrity With mod Assistance. Outcome: Progressing Goal: RH STG ABLE TO PERFORM INCISION/WOUND CARE W/ASSISTANCE Description: STG Able To Perform Incision/Wound Care With max Assistance. Outcome: Progressing   Problem: RH SAFETY Goal: RH STG ADHERE TO SAFETY PRECAUTIONS W/ASSISTANCE/DEVICE Description: STG Adhere to Safety Precautions With min Assistance/Device. Outcome: Progressing   Problem: RH PAIN MANAGEMENT Goal: RH STG PAIN MANAGED AT OR BELOW PT'S PAIN GOAL Description: Less than 3 on pain scale Outcome: Progressing   Problem: RH KNOWLEDGE DEFICIT SCI Goal: RH STG INCREASE KNOWLEDGE OF SELF CARE AFTER SCI Description: Prior to discharge Outcome: Progressing   Problem: Consults Goal: RH SPINAL CORD INJURY PATIENT EDUCATION Description:  See Patient Education module for education specifics.  Outcome: Progressing

## 2018-11-22 NOTE — Progress Notes (Signed)
Physical Therapy Session Note  Patient Details  Name: [name] MRN: [id number] Date of Birth: October 20, 1940  Today's Date: 11/22/2018 PT Individual Time: 1300-1412 PT Individual Time Calculation (min): 72 min   Short Term Goals: Week 2:  PT Short Term Goal 1 (Week 2): =LTGs due to ELOS  Skilled Therapeutic Interventions/Progress Updates:   Pt in w/c and agreeable to therapy, pain 6/10 in L groin, made RN aware that pt was requesting pain medication. Pt self-propelled w/c around unit at mod/i level, >150'. Pt also performed all w/c parts management today w/o assist from therapist. Reviewed HEP including seated LLE therex and supine BLE therex. Pt independent w/ seated exercises including LAQs, heel slides, adduction squeezes, heel raises, and knee marches. Provided w/ written handout. Supervision squat pivot to mat and sit>supine w/ supervision. BLE therex in supine including L single leg bridge, R residual limb extension onto towel, and L figure-4 adductor stretch. Pt demo-ed correctly and within pain tolerable range. Provided w/ written handout of these as well. Donned RLE prosthesis w/ total assist and supine>sit w/ min assist. Performed stand pivot w/ RW and RLE prosthesis w/ CGA x3 reps. No cues needed for RW management or step placement today. Worked on UE fine motor during rest breaks including grasping clothespins and stacking foam legos. Arm bike @ level 1.0 x 3 min for UE strengthening and endurance training. Pt unable to tolerate further 2/2 L trigger finger pain and increase in neck pain, both of which resolve w/ rest. Returned to room and educated pt's wife on HEP as well. Provided extensive education regarding pt's L groin pain. Pain appears musculoskeletal in nature as he has pain w/ both active and resisted L hip flexion and adduction. Discussed continuing w/ ice, rest and energy conservation for healing and to follow-up w/ MD regarding further recommendations. Both in agreement and  appreciative of education. Ended session in w/c, all needs in reach.   Therapy Documentation Precautions:  Precautions Precautions: Fall, Cervical Precaution Booklet Issued: Yes (comment) Precaution Comments: R AKA Required Braces or Orthoses: Cervical Brace Cervical Brace: Soft collar Restrictions Weight Bearing Restrictions: No  Therapy/Group: Individual Therapy  [name] Clent Demark 11/22/2018, 5:48 PM

## 2018-11-22 NOTE — Progress Notes (Signed)
Occupational Therapy Session Note  Patient Details  Name: [name] MRN: [id number] Date of Birth: 01/23/1941  Today's Date: 11/22/2018 OT Individual Time: 0730-0845 OT Individual Time Calculation (min): 75 min    Short Term Goals: Week 2:  OT Short Term Goal 1 (Week 2): LTG=STG 2/2 ELOS :   Skilled Therapeutic Interventions/Progress Updates:  Pt received in bed supine reporting he was awoken very early; ready for TX with OT. Pt reports pain in L LE and reports nurse is coming shortly to administer pain meds. Pt comes EOB with (S) and completes LB dressing with CGA with lateral leans. Pt transfers EOB <> w/c with (S) and completes oral care and washing face seated in w/c at sink level with (S). Pt completes shaving task while seated at sink with min A to shave lower neck area and R side. Pt is (S) for UB dressing. Pt mod A for donning tennis shoe on L LE. Pt self propels w/c to therapy gym with (S). Pt transfers w/c <> EOM with (S) and completes functional task with large clothes pins to address lateral leans, reaching across midline, dynamic sitting balance and increase grip strength in L hand. 10 Clothes pins attached pt shirt; pt able to use L hand sensation and pincer grip to remove clips and clip to object placed to the L. Pt complete UE exercises sitting EOB with 3lb dowelrod; focused on elbor extension/flexion, wrist extension/flexion, shoulder flexion/extension. Pt completes each exercise 15 times; 2 trials through. Pt dons prosthetic in therapy gym with min A for pulling up and feeding strap through small spaces. Pt completes 5  functional sit <> stand transfers at gym table to address static standing balance, tolerance of prosthesis and activity tolerance in standing in preparation for ADLS task. Pt asking to doff prosthesis after wearing <5 mins d/t agitation and hurting. Exited session with pt in w/c, call bell in reach and all needs met.      Therapy Documentation Precautions:   Precautions Precautions: Fall, Cervical Precaution Comments: R AKA Required Braces or Orthoses: Cervical Brace Cervical Brace: Soft collar(reviewed cervical precautions) Restrictions Weight Bearing Restrictions: No      Therapy/Group: Individual Therapy  Olivia Ward 11/22/2018, 11:18 AM 

## 2018-11-22 NOTE — Plan of Care (Signed)
  Problem: RH Dressing Goal: LTG Patient will perform lower body dressing w/assist (OT) Description: LTG: Patient will perform lower body dressing with assist, with/without cues in positioning using equipment (OT) Outcome: Not Met (add Reason) Note: Pt needs min/CGA for pulling pants up using lateral leans 2/2 functional grasp deficits-ESD   Problem: RH Toileting Goal: LTG Patient will perform toileting task (3/3 steps) with assistance level (OT) Description: LTG: Patient will perform toileting task (3/3 steps) with assistance level (OT)  Outcome: Not Met (add Reason) Note: Pt is able to complete peri-care and doff pants, but needs CGA/min A to pull pants back up over hips   Problem: RH Tub/Shower Transfers Goal: LTG Patient will perform tub/shower transfers w/assist (OT) Description: LTG: Patient will perform tub/shower transfers with assist, with/without cues using equipment (OT) Outcome: Not Met (add Reason) Note: Pt needs CGA for shower transfers in simulated home environment 2/2 placement of slideboard to transfer from commode to tub bench.

## 2018-11-22 NOTE — Progress Notes (Signed)
[place] PHYSICAL MEDICINE & REHABILITATION PROGRESS NOTE   Subjective/Complaints: Still with groin and medial thigh to knee pain on left. No changes. Asked about flu shot  ROS: Patient denies fever, rash, sore throat, blurred vision, nausea, vomiting, diarrhea, cough, shortness of breath or chest pain,  headache, or mood change.    Objective:   Dg Hip Unilat With Pelvis 2-3 Views Left  Result Date: 11/21/2018 CLINICAL DATA:  Left hip pain. EXAM: DG HIP (WITH OR WITHOUT PELVIS) 2-3V LEFT COMPARISON:  January 30, 2018 FINDINGS: Atherosclerotic changes seen in the iliac and femoral vessels. Degenerative changes seen in the lower lumbar spine. Mild degenerative changes in the hips. No significant loss of joint space in either hip. No bony lesions or fractures. No dislocations. IMPRESSION: Mild degenerative changes in the hips. No other acute abnormalities. Electronically Signed   By: Dorise Bullion III M.D   On: 11/21/2018 12:50   Recent Labs    11/21/18 1131 11/22/18 0558  WBC 17.4* 13.4*  HGB 9.5* 9.3*  HCT 28.0* 28.1*  PLT 214 180   Recent Labs    11/21/18 1131 11/22/18 0558  NA 136 139  K 3.5 3.9  CL 99 99  CO2 25 26  GLUCOSE 113* 134*  BUN 48* 50*  CREATININE 1.60* 1.61*  CALCIUM 8.0* 8.2*    Intake/Output Summary (Last 24 hours) at 11/22/2018 0929 Last data filed at 11/22/2018 0328 Gross per 24 hour  Intake 360 ml  Output 600 ml  Net -240 ml     Physical Exam: Vital Signs Blood pressure (!) 147/63, pulse 61, temperature 97.7 F (36.5 C), temperature source Oral, resp. rate 16, height 5\' 6"  (1.676 m), weight 68 kg, SpO2 98 %.  Constitutional: No distress . Vital signs reviewed. HEENT: EOMI, oral membranes moist Neck: supple Cardiovascular: RRR without murmur. No JVD    Respiratory: CTA Bilaterally without wheezes or rales. Normal effort    GI: BS +, non-tender, non-distended  Skin: Neck incision with dressing C/D/I Psych: Normal mood.  Normal  behavior. Musc: Right AKA Left lower extremity edema. Left hip adductors and hip flexors tender with palpation. Could not HF against gravity. No pain with HAD. Pain with palpation near groin, ASIS minimal with lateral palpation. Right leg in AK prosthesis Neuro: Alert Motor: Bilateral upper extremities: 4/5 proximal to 3+ to 4- distal with sensory changes. Left lower extremity: Hip flexion 2/5 3/5, knee extension, distally 4/5 (pain inhibition),  No obvious sensory abnl in legs  Assessment/Plan: 1. Functional deficits secondary to cervical stenosis/HNP/severe myelopathy which require 3+ hours per day of interdisciplinary therapy in a comprehensive inpatient rehab setting.  Physiatrist is providing close team supervision and 24 hour management of active medical problems listed below.  Physiatrist and rehab team continue to assess barriers to discharge/monitor patient progress toward functional and medical goals  Care Tool:  Bathing    Body parts bathed by patient: Right arm, Left arm, Chest, Abdomen, Front perineal area, Buttocks, Right upper leg, Face, Left upper leg   Body parts bathed by helper: Left lower leg Body parts n/a: Right lower leg   Bathing assist Assist Level: Minimal Assistance - Patient > 75%     Upper Body Dressing/Undressing Upper body dressing   What is the patient wearing?: Pull over shirt    Upper body assist Assist Level: Minimal Assistance - Patient > 75%    Lower Body Dressing/Undressing Lower body dressing      What is the patient wearing?: Pants  Lower body assist Assist for lower body dressing: Minimal Assistance - Patient > 75%     Toileting Toileting Toileting Activity did not occur (Clothing management and hygiene only): N/A (no void or bm)(used urinal, no BM)  Toileting assist Assist for toileting: Minimal Assistance - Patient > 75%     Transfers Chair/bed transfer  Transfers assist     Chair/bed transfer assist level: Minimal  Assistance - Patient > 75% Chair/bed transfer assistive device: Armrests, Walker   Locomotion Ambulation   Ambulation assist   Ambulation activity did not occur: Safety/medical concerns  Assist level: Moderate Assistance - Patient 50 - 74% Assistive device: Walker-rolling Max distance: 10'   Walk 10 feet activity   Assist  Walk 10 feet activity did not occur: Safety/medical concerns  Assist level: Moderate Assistance - Patient - 50 - 74% Assistive device: Walker-rolling   Walk 50 feet activity   Assist Walk 50 feet with 2 turns activity did not occur: Safety/medical concerns         Walk 150 feet activity   Assist Walk 150 feet activity did not occur: Safety/medical concerns         Walk 10 feet on uneven surface  activity   Assist Walk 10 feet on uneven surfaces activity did not occur: Safety/medical concerns         Wheelchair     Assist Will patient use wheelchair at discharge?: Yes Type of Wheelchair: Manual    Wheelchair assist level: Independent Max wheelchair distance: 150'    Wheelchair 50 feet with 2 turns activity    Assist        Assist Level: Independent   Wheelchair 150 feet activity     Assist  Wheelchair 150 feet activity did not occur: Safety/medical concerns   Assist Level: Independent   Blood pressure (!) 147/63, pulse 61, temperature 97.7 F (36.5 C), temperature source Oral, resp. rate 16, height 5\' 6"  (1.676 m), weight 68 kg, SpO2 98 %.  Medical Problem List and Plan: 1.Decreased functional ability with upper extremity weaknesssecondary to cervical stenosis/HNP/severe myelopathy. Status post C3-4 anterior cervical corpectomy andAnterior cervical arthrodesis C3-4 4-5 and 5-6 11/09/2018.  Team conference today  ELOS 10/14  -Patient to see Rehab MD/provider in the office for transitional care encounter in 1-2 weeks.   Soft collar as directed  Decadron wean:  2 mg every 8 on 10/7, now at 1 mg every  8 hours===decrease to q12 hours today 2. Antithrombotics: -DVT/anticoagulation:Chronic Eliquis on hold x7 days from day of surgery, restarted 2.5 twice daily on 10/7 -antiplatelet therapy: N/A 3. Pain Management:Oxycodone and Robaxin as needed  Added Lidocaine cream for hands B/L QID   Gabapentin increased to 200mg  3 times daily on 10/5 with benefit, but still having pain---> increase to 300mg  tid 10/13  Left lower extremity pain may be radicular. He has severe multifactorial and multi-level stenosis on MRI from 08/2018. However nerve root impingment doesn't really match pain per se as the level involved most significantly on the left is L4.  -I reviewed xrays of left hip with patient. 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   -continue K-tape  -may benefit from MRI of left hip/pelvis as outpt given above 4. Mood:Provide emotional support -antipsychotic agents: N/A 5. Neuropsych: This patientiscapable of making decisions on hisown behalf. 6. Skin/Wound Care:Routine skin checks 7. Fluids/Electrolytes/Nutrition:Routine in and outs 8. Acute on chronic anemia.   Hemoglobin up to 9.3 101/3!  Hemoccult ordered and negative 9.Chronic myeloid  leukemia. Follow-up outpatient  Home Gleevec restarted on 10/8 10. CKD stage III. Creatinine baseline 1.63-1.79  Cr 1.6 10/13  Continue to monitor 11. 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 Patient did receive inpatient rehab services 13. Myasthenia gravis. Continue Mestinon and low-dose chronic prednisone 14. Hypertension.   Lasix 40 mg daily, extra dose ordered on 10/9  Imdur 30 mg daily.   Improved 10/13 15.  Klebsiella UTI   Cipro started on 10/5, 7-day course completed 16. Neurogenic bowel:  Increase bowel meds as needed 17.  Steroid-induced hyperglycemia  Continue to monitor with steroid wean 18.  Hypoalbuminemia  Supplement initiated on 10/5  LOS: 11 days A FACE TO FACE  EVALUATION WAS PERFORMED  Meredith Staggers 11/22/2018, 9:29 AM

## 2018-11-23 ENCOUNTER — Inpatient Hospital Stay (HOSPITAL_COMMUNITY): Payer: No Typology Code available for payment source | Admitting: Physical Therapy

## 2018-11-23 NOTE — Progress Notes (Signed)
Pt discharged to home with family. PT assisted patient to transfer to care.

## 2018-11-23 NOTE — Progress Notes (Signed)
[place] PHYSICAL MEDICINE & REHABILITATION PROGRESS NOTE   Subjective/Complaints: Still having left medial thigh/hip pain. Able to cope with it however. Anxious to get home  ROS: Patient denies fever, rash, sore throat, blurred vision, nausea, vomiting, diarrhea, cough, shortness of breath or chest pain,  headache, or mood change.    Objective:   Dg Hip Unilat With Pelvis 2-3 Views Left  Result Date: 11/21/2018 CLINICAL DATA:  Left hip pain. EXAM: DG HIP (WITH OR WITHOUT PELVIS) 2-3V LEFT COMPARISON:  January 30, 2018 FINDINGS: Atherosclerotic changes seen in the iliac and femoral vessels. Degenerative changes seen in the lower lumbar spine. Mild degenerative changes in the hips. No significant loss of joint space in either hip. No bony lesions or fractures. No dislocations. IMPRESSION: Mild degenerative changes in the hips. No other acute abnormalities. Electronically Signed   By: Dorise Bullion III M.D   On: 11/21/2018 12:50   Recent Labs    11/21/18 1131 11/22/18 0558  WBC 17.4* 13.4*  HGB 9.5* 9.3*  HCT 28.0* 28.1*  PLT 214 180   Recent Labs    11/21/18 1131 11/22/18 0558  NA 136 139  K 3.5 3.9  CL 99 99  CO2 25 26  GLUCOSE 113* 134*  BUN 48* 50*  CREATININE 1.60* 1.61*  CALCIUM 8.0* 8.2*    Intake/Output Summary (Last 24 hours) at 11/23/2018 0833 Last data filed at 11/23/2018 0257 Gross per 24 hour  Intake 600 ml  Output 500 ml  Net 100 ml     Physical Exam: Vital Signs Blood pressure (!) 159/84, pulse 60, temperature 98.2 F (36.8 C), temperature source Oral, resp. rate 16, height 5\' 6"  (1.676 m), weight 67 kg, SpO2 99 %.  Constitutional: No distress . Vital signs reviewed. HEENT: EOMI, oral membranes moist Neck: supple Cardiovascular: RRR without murmur. No JVD    Respiratory: CTA Bilaterally without wheezes or rales. Normal effort    GI: BS +, non-tender, non-distended  Skin: Neck incision CDI, steristrips out Psych: Normal mood.  Normal  behavior. Musc: Right AKA Left lower extremity edema. Left hip adductors and hip flexors tender with palpation. Could not HF against gravity. No pain with HAD. Pain with palpation near groin, ASIS minimal with lateral palpation---no change. KT in place Right leg in AK prosthesis Neuro: Alert Motor: Bilateral upper extremities: 4/5 proximal to 3+ to 4- distal with sensory changes. Left lower extremity: Hip flexion 2/5 3/5, knee extension, distally 4/5 (ongoing pain inhibition),  No obvious sensory abnl in legs. DTR's tr to absent in both legs  Assessment/Plan: 1. Functional deficits secondary to cervical stenosis/HNP/severe myelopathy which require 3+ hours per day of interdisciplinary therapy in a comprehensive inpatient rehab setting.  Physiatrist is providing close team supervision and 24 hour management of active medical problems listed below.  Physiatrist and rehab team continue to assess barriers to discharge/monitor patient progress toward functional and medical goals  Care Tool:  Bathing    Body parts bathed by patient: Face   Body parts bathed by helper: Left lower leg Body parts n/a: Right lower leg   Bathing assist Assist Level: Supervision/Verbal cueing     Upper Body Dressing/Undressing Upper body dressing   What is the patient wearing?: Pull over shirt    Upper body assist Assist Level: Supervision/Verbal cueing    Lower Body Dressing/Undressing Lower body dressing      What is the patient wearing?: Pants     Lower body assist Assist for lower body dressing: Minimal Assistance -  Patient > 75%     Toileting Toileting Toileting Activity did not occur Landscape architect and hygiene only): N/A (no void or bm)(used urinal, no BM)  Toileting assist Assist for toileting: Contact Guard/Touching assist     Transfers Chair/bed transfer  Transfers assist     Chair/bed transfer assist level: Supervision/Verbal cueing Chair/bed transfer assistive device:  Armrests   Locomotion Ambulation   Ambulation assist   Ambulation activity did not occur: Safety/medical concerns  Assist level: Moderate Assistance - Patient 50 - 74% Assistive device: Walker-rolling Max distance: 10'   Walk 10 feet activity   Assist  Walk 10 feet activity did not occur: Safety/medical concerns  Assist level: Moderate Assistance - Patient - 50 - 74% Assistive device: Walker-rolling   Walk 50 feet activity   Assist Walk 50 feet with 2 turns activity did not occur: Safety/medical concerns         Walk 150 feet activity   Assist Walk 150 feet activity did not occur: Safety/medical concerns         Walk 10 feet on uneven surface  activity   Assist Walk 10 feet on uneven surfaces activity did not occur: Safety/medical concerns         Wheelchair     Assist Will patient use wheelchair at discharge?: Yes Type of Wheelchair: Manual    Wheelchair assist level: Independent Max wheelchair distance: 150'    Wheelchair 50 feet with 2 turns activity    Assist        Assist Level: Independent   Wheelchair 150 feet activity     Assist  Wheelchair 150 feet activity did not occur: Safety/medical concerns   Assist Level: Independent   Blood pressure (!) 159/84, pulse 60, temperature 98.2 F (36.8 C), temperature source Oral, resp. rate 16, height 5\' 6"  (1.676 m), weight 67 kg, SpO2 99 %.  Medical Problem List and Plan: 1.Decreased functional ability with upper extremity weaknesssecondary to cervical stenosis/HNP/severe myelopathy. Status post C3-4 anterior cervical corpectomy andAnterior cervical arthrodesis C3-4 4-5 and 5-6 11/09/2018.  DC home today  -Patient to see Rehab MD/provider in the office for transitional care encounter in 1-2 weeks.   Soft collar when up  Decadron wean:  2 mg every 8 on 10/7, now at 1 mg every 8 hours===decreased to q12 hours 10/13 and wean to off over next few days. 2.  Antithrombotics: -DVT/anticoagulation:Chronic Eliquis on hold x7 days from day of surgery, restarted 2.5 twice daily on 10/7 -antiplatelet therapy: N/A 3. Pain Management:Oxycodone and Robaxin as needed  Added Lidocaine cream for hands B/L QID   Dc home on gabapentin 300mg  tid    Left lower extremity pain may be radicular. He has severe multifactorial and multi-level stenosis on MRI from 08/2018. However nerve root impingment doesn't really match pain pattern as the level involved most significantly on the left is L4.  -I reviewed xrays of left hip with patient. 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   -continue K-tape  -will need further work up of LLE pain as an outpt including follow up with Dr. Ronnald Ramp and our group. Consider MRI of left hip/pelvis   4. Mood:Provide emotional support -antipsychotic agents: N/A 5. Neuropsych: This patientiscapable of making decisions on hisown behalf. 6. Skin/Wound Care:Routine skin checks 7. Fluids/Electrolytes/Nutrition:Routine in and outs 8. Acute on chronic anemia.   Hemoglobin up to 9.3 101/3!  Hemoccult ordered and negative 9.Chronic myeloid leukemia. Follow-up outpatient  Home Gleevec restarted on 10/8 10. CKD  stage III. Creatinine baseline 1.63-1.79  Cr 1.6 10/13  Continue to monitor 11. 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 Patient did receive inpatient rehab services 13. Myasthenia gravis. Continue Mestinon and low-dose chronic prednisone 14. Hypertension.   Lasix 40 mg daily, extra dose ordered on 10/9  Imdur 30 mg daily.   Improved 10/14 15.  Klebsiella UTI   Cipro started on 10/5, 7-day course completed 16. Neurogenic bowel:  Increase bowel meds as needed 17.  Steroid-induced hyperglycemia   steroid wean 18.  Hypoalbuminemia  Supplement initiated on 10/5  LOS: 12 days A FACE TO FACE EVALUATION WAS PERFORMED  Meredith Staggers 11/23/2018, 8:33 AM

## 2018-11-23 NOTE — Patient Care Conference (Signed)
Inpatient RehabilitationTeam Conference and Plan of Care Update Date: 11/22/2018   Time: 10:55 am    Patient Name: [name]      Medical Record Number: [id number]  Date of Birth: 1940-10-09 Sex: Male         Room/Bed: 4W07C/4W07C-01 Payor Info: Payor: MEDICARE / Plan: MEDICARE PART A AND B / Product Type: *No Product type* /    Admit Date/Time:  11/11/2018  3:49 PM  Primary Diagnosis:  Cervical myelopathy ([place])  Patient Active Problem List   Diagnosis Date Noted  . Benign essential HTN   . Chronic myeloid leukemia ([place])   . Labile blood pressure   . Acute on chronic anemia   . Neuropathic pain   . Hypoalbuminemia due to protein-calorie malnutrition ([place])   . Steroid-induced hyperglycemia   . UTI due to Klebsiella species   . Cervical myelopathy ([place]) 11/11/2018  . S/P cervical spinal fusion 11/09/2018  . Neuropathic pain of right ankle 06/27/2018  . Insomnia due to medical condition 04/13/2018  . Atherosclerosis of native coronary artery of native heart without angina pectoris   . Hypokalemia   . Stage 3 chronic kidney disease   . 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 Acute blood loss anemia 02/08/2018  . Post-operative pain   . Unilateral AKA, right ([place])   . Ischemic foot 02/05/2018  . Ischemia of right lower extremity   . Pressure injury of skin 12/27/2017  . Arterial occlusive disease   . Postoperative pain   . CKD (chronic kidney disease), stage III   . History of CVA (cerebrovascular accident)   . 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 Septic arthritis of left sternoclavicular joint ([place]) 11/03/2017  . Cervical spinal cord compression ([place]) 10/30/2017  . Nasal abscess 09/25/2017  . Immunocompromised ([place]) 09/25/2017  . Acute on chronic blood loss anemia 08/05/2017  . Transient ischemic attack (TIA) 07/23/2017  . Subcortical infarction ([place])   . Right hemiparesis ([place])   . Ataxia 07/21/2017  . TIA (transient ischemic attack)   . Wheezing  06/17/2017  . Bursitis of left elbow 05/01/2017  . Dystonia 12/29/2016  . PAD (peripheral artery disease) ([place]) 11/18/2016  . Osteoporosis 08/28/2016  . Paronychia of great toe, left 08/10/2016  . 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 Hypocalcemia 07/09/2016  . Diplopia 07/02/2016  . S/P lumbar laminectomy 03/11/2016  . Radicular pain of left lower extremity 02/21/2016  . Cough 01/10/2016  . 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 Myasthenia gravis ([place])   . Diabetes mellitus type 2 in nonobese (H[facility])   . Ataxia, post-stroke   . Gait disturbance, post-stroke   . Proximal leg weakness   . Cervical spondylosis without myelopathy   . Achilles tendinitis of right lower extremity   . Chronic diastolic CHF (congestive heart failure) ([place])   . Gastroesophageal reflux disease without esophagitis   . 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 Cerebellar stroke ([place])   . Ischemic stroke ([place])   . Cerebrovascular accident (CVA) due to thrombosis of left vertebral artery ([place])   . History of pulmonary embolus (PE)   . Chronic anticoagulation   . HLD (hyperlipidemia)   . Neck pain 03/19/2015  . Pain in the chest   . Diabetes mellitus type II, controlled ([place])   . Diabetic gastroparesis ([place]) 03/07/2015  . Screening for osteoporosis 07/19/2014  . Dyspnea 04/09/2014  . Edema 04/09/2014  . Routine general medical examination at  a health care facility 10/23/2013  . Bilateral carotid bruits 05/24/2013  . Abnormal CT scan, lung 05/24/2013  . Encounter for therapeutic drug monitoring 03/14/2013  . Lymphadenopathy, inguinal 01/11/2013  . AKI (acute kidney injury) ([place]) 10/28/2012  . 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 Myasthenia gravis with exacerbation ([place]) 03/31/2012  . Disease of salivary gland 12/09/2011  . Left facial pain 10/28/2011  . Pulmonary embolism ([place]) 10/25/2011  . 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  GERD with stricture 09/23/2011  . Long term (current) use of anticoagulants 08/03/2011  . History of pulmonary embolism 07/30/2011  . History of DVT (deep vein thrombosis) 07/30/2011  . Subconjunctival hemorrhage 06/22/2011  . Bilateral conjunctivitis 04/09/2011  . Diabetes mellitus type 2, controlled ([place]) 12/18/2010  . Hearing loss 08/04/2010  . Ptosis of eyelid 12/30/2009  . Essential hypertension 04/24/2009  . HYPERLIPIDEMIA, MIXED 10/05/2008  . HEMORRHOIDS-INTERNAL 07/02/2008  . HEMORRHOIDS-EXTERNAL 07/02/2008  . DIVERTICULOSIS-COLON 07/02/2008  . PERSONAL HX COLONIC POLYPS 07/02/2008  . RASH-NONVESICULAR 02/07/2008  . PNEUMONIA 12/08/2007  . Chronic myeloid leukemia in remission ([place]) 07/07/2007  . CAD (coronary artery disease) 07/07/2007  . NEPHROLITHIASIS, HX OF 07/07/2007    Expected Discharge Date: Expected Discharge Date: 11/23/18  Team Members Present: Physician leading conference: Dr. Alger Simons Social Worker Present: Lennart Pall, LCSW Nurse Present: Ellison Carwin, LPN PT Present: Burnard Bunting, PT OT Present: Cherylynn Ridges, OT SLP Present: Weston Anna, SLP PPS Coordinator present : Gunnar Fusi, SLP     Current Status/Progress Goal Weekly Team Focus  Bowel/Bladder   Continent of B&B. LBM 10/12  Remain Continent of B&B  Continue to asses and monitor. Continue with timed toileting   Swallow/Nutrition/ Hydration             ADL's   CGA/supervision overall  Supervision  Transfers, dc planning, pt/family education   Mobility   min assist bed mobility, CGA-supervision squat pivot transfers, CGA-min assist stand pivot transfers w/ RLE prosthesis and RW  supervision-min assist overall  discharge planning, stand pivot transfers and RLE prosthetic use, global endurance and strengthening   Communication             Safety/Cognition/ Behavioral Observations            Pain   Patient has left leg pain, numbness/tingling in bilateral hands. Pain 5/10  2/10   Continue to assess and monitor. Continue to give prn meds as ordered   Skin   Surgical incison to Rt AKA steri strips intact.  to remain free of breakdown and free of infection  Continue to assess and monitor q shift    Rehab Goals Patient on target to meet rehab goals: Yes *See Care Plan and progress notes for long and short-term goals.     Barriers to Discharge  Current Status/Progress Possible Resolutions Date Resolved   Nursing                  PT                    OT                  SLP                SW                Discharge Planning/Teaching Needs:  Plan to d/c home with wife providing 24/7 assistance.  Teaching done   Team Discussion:  MD notes imaging  does not correlate with overall presentation;  C/o left hip pain;  Cont b/b.  Can be emotional/ tearful at times.  CG - S overall with OT/PT and supervision goals.  Revisions to Treatment Plan:  NA    Medical Summary Current Status: cervical myelopathy, pain an issue in his left thigh/hip. pain primarily medial thigh, not below knee Weekly Focus/Goal: stabilize medically for discharge. establish follow up med/surg plan  Barriers to Discharge: Medical stability       Continued Need for Acute Rehabilitation Level of Care: The patient requires daily medical management by a physician with specialized training in physical medicine and rehabilitation for the following reasons: Direction of a multidisciplinary physical rehabilitation program to maximize functional independence : Yes Medical management of patient stability for increased activity during participation in an intensive rehabilitation regime.: Yes Analysis of laboratory values and/or radiology reports with any subsequent need for medication adjustment and/or medical intervention. : Yes   I attest that I was present, lead the team conference, and concur with the assessment and plan of the team.   Joanthony Hamza 11/23/2018, 11:03 AM   Team conference was  held via web/ teleconference due to [place] - 19

## 2018-11-23 NOTE — Progress Notes (Signed)
Social Work Discharge Note   The overall goal for the admission was met for:   Discharge location: Yes - home with wife who can provide 24/7 assistance  Length of Stay: Yes - 12 days  Discharge activity level: Yes - supervision- min assist  Home/community participation: Yes  Services provided included: MD, RD, PT, OT, RN, Pharmacy and [place]: Medicare and Private Insurance: [employer]  Follow-up services arranged: Home Health: PT, OT via [facility] and Patient/Family has no preference for HH/DME agencies  Comments (or additional information):     Contact person, wife, Dujuan Stankowski @ [phone]  Patient/Family verbalized understanding of follow-up arrangements: Yes  Individual responsible for coordination of the follow-up plan: pt/spouse  Confirmed correct DME delivered: Lennart Pall 11/23/2018    Jacoria Keiffer

## 2018-11-28 ENCOUNTER — Telehealth: Payer: Self-pay | Admitting: Registered Nurse

## 2018-11-28 NOTE — Telephone Encounter (Signed)
Transitional Care call  Patient name: [name]  DOB: 1940-05-22 1. Are you/is patient experiencing any problems since coming home? No a. Are there any questions regarding any aspect of care? No 2. Are there any questions regarding medications administration/dosing? [name] though he was on another medication while in the hospital that he didn't received at discharge. Medication list was reviewed he verbalizes understanding.  a. Are meds being taken as prescribed? Yes b. "Patient should review meds with caller to confirm" Medication List Reviewed.  3. Have there been any falls? No 4. Has Home Health been to the house and/or have they contacted you? No. [facility] a. If not, have you tried to contact them? No b. Can we help you contact them? Yes, [facility] was called, they spoke with [name] on Friday 11/25/2018. They are scheduled to come to the home on 11/29/2018. Left message on [name] phone regarding the above.  5. Are bowels and bladder emptying properly? Yes a. Are there any unexpected incontinence issues? No b. If applicable, is patient following bowel/bladder programs? NA 6. Any fevers, problems with breathing, unexpected pain? No 7. Are there any skin problems or new areas of breakdown? No 8. Has the patient/family member arranged specialty MD follow up (ie cardiology/neurology/renal/surgical/etc.)?  [name] has an appointmeny scheduled with Dr. Ronnald Ramp. He was also instructed to call his PCP to schedule HFU appointment. He verbalizes understanding.  a. Can we help arrange? No 9. Does the patient need any other services or support that we can help arrange? No 10. Are caregivers following through as expected in assisting the patient? Yes 11. Has the patient quit smoking, drinking alcohol, or using drugs as recommended? (                        )  Appointment date/time 12/07/2018  arrival time 8:45 for 9:00 appointment with Dr. Naaman Plummer. At [facility]

## 2018-11-29 DIAGNOSIS — G4700 Insomnia, unspecified: Secondary | ICD-10-CM | POA: Diagnosis not present

## 2018-11-29 DIAGNOSIS — E1165 Type 2 diabetes mellitus with hyperglycemia: Secondary | ICD-10-CM | POA: Diagnosis not present

## 2018-11-29 DIAGNOSIS — E1122 Type 2 diabetes mellitus with diabetic chronic kidney disease: Secondary | ICD-10-CM | POA: Diagnosis not present

## 2018-11-29 DIAGNOSIS — M48061 Spinal stenosis, lumbar region without neurogenic claudication: Secondary | ICD-10-CM | POA: Diagnosis not present

## 2018-11-29 DIAGNOSIS — K219 Gastro-esophageal reflux disease without esophagitis: Secondary | ICD-10-CM | POA: Diagnosis not present

## 2018-11-29 DIAGNOSIS — Z7901 Long term (current) use of anticoagulants: Secondary | ICD-10-CM | POA: Diagnosis not present

## 2018-11-29 DIAGNOSIS — M4802 Spinal stenosis, cervical region: Secondary | ICD-10-CM | POA: Diagnosis not present

## 2018-11-29 DIAGNOSIS — M5412 Radiculopathy, cervical region: Secondary | ICD-10-CM | POA: Diagnosis not present

## 2018-11-29 DIAGNOSIS — N183 Chronic kidney disease, stage 3 unspecified: Secondary | ICD-10-CM | POA: Diagnosis not present

## 2018-11-29 DIAGNOSIS — H409 Unspecified glaucoma: Secondary | ICD-10-CM | POA: Diagnosis not present

## 2018-11-29 DIAGNOSIS — H9193 Unspecified hearing loss, bilateral: Secondary | ICD-10-CM | POA: Diagnosis not present

## 2018-11-29 DIAGNOSIS — M4712 Other spondylosis with myelopathy, cervical region: Secondary | ICD-10-CM | POA: Diagnosis not present

## 2018-11-29 DIAGNOSIS — M7032 Other bursitis of elbow, left elbow: Secondary | ICD-10-CM | POA: Diagnosis not present

## 2018-11-29 DIAGNOSIS — E782 Mixed hyperlipidemia: Secondary | ICD-10-CM | POA: Diagnosis not present

## 2018-11-29 DIAGNOSIS — G7000 Myasthenia gravis without (acute) exacerbation: Secondary | ICD-10-CM | POA: Diagnosis not present

## 2018-11-29 DIAGNOSIS — I130 Hypertensive heart and chronic kidney disease with heart failure and stage 1 through stage 4 chronic kidney disease, or unspecified chronic kidney disease: Secondary | ICD-10-CM | POA: Diagnosis not present

## 2018-11-29 DIAGNOSIS — I5032 Chronic diastolic (congestive) heart failure: Secondary | ICD-10-CM | POA: Diagnosis not present

## 2018-11-29 DIAGNOSIS — E1151 Type 2 diabetes mellitus with diabetic peripheral angiopathy without gangrene: Secondary | ICD-10-CM | POA: Diagnosis not present

## 2018-11-29 DIAGNOSIS — I6529 Occlusion and stenosis of unspecified carotid artery: Secondary | ICD-10-CM | POA: Diagnosis not present

## 2018-11-29 DIAGNOSIS — I2510 Atherosclerotic heart disease of native coronary artery without angina pectoris: Secondary | ICD-10-CM | POA: Diagnosis not present

## 2018-11-29 DIAGNOSIS — D631 Anemia in chronic kidney disease: Secondary | ICD-10-CM | POA: Diagnosis not present

## 2018-11-29 DIAGNOSIS — C9291 Myeloid leukemia, unspecified in remission: Secondary | ICD-10-CM | POA: Diagnosis not present

## 2018-11-29 DIAGNOSIS — M810 Age-related osteoporosis without current pathological fracture: Secondary | ICD-10-CM | POA: Diagnosis not present

## 2018-11-29 DIAGNOSIS — M5021 Other cervical disc displacement,  high cervical region: Secondary | ICD-10-CM | POA: Diagnosis not present

## 2018-11-29 DIAGNOSIS — K5900 Constipation, unspecified: Secondary | ICD-10-CM | POA: Diagnosis not present

## 2018-11-30 ENCOUNTER — Ambulatory Visit: Payer: No Typology Code available for payment source | Admitting: Physician Assistant

## 2018-12-01 ENCOUNTER — Encounter: Payer: Self-pay | Admitting: Family Medicine

## 2018-12-01 ENCOUNTER — Ambulatory Visit (INDEPENDENT_AMBULATORY_CARE_PROVIDER_SITE_OTHER): Payer: Medicare Other | Admitting: Family Medicine

## 2018-12-01 ENCOUNTER — Other Ambulatory Visit: Payer: Self-pay

## 2018-12-01 DIAGNOSIS — M48061 Spinal stenosis, lumbar region without neurogenic claudication: Secondary | ICD-10-CM | POA: Diagnosis not present

## 2018-12-01 DIAGNOSIS — M5412 Radiculopathy, cervical region: Secondary | ICD-10-CM | POA: Diagnosis not present

## 2018-12-01 DIAGNOSIS — N390 Urinary tract infection, site not specified: Secondary | ICD-10-CM

## 2018-12-01 DIAGNOSIS — M4802 Spinal stenosis, cervical region: Secondary | ICD-10-CM | POA: Diagnosis not present

## 2018-12-01 DIAGNOSIS — R509 Fever, unspecified: Secondary | ICD-10-CM

## 2018-12-01 DIAGNOSIS — M5021 Other cervical disc displacement,  high cervical region: Secondary | ICD-10-CM | POA: Diagnosis not present

## 2018-12-01 DIAGNOSIS — M4712 Other spondylosis with myelopathy, cervical region: Secondary | ICD-10-CM | POA: Diagnosis not present

## 2018-12-01 DIAGNOSIS — I130 Hypertensive heart and chronic kidney disease with heart failure and stage 1 through stage 4 chronic kidney disease, or unspecified chronic kidney disease: Secondary | ICD-10-CM | POA: Diagnosis not present

## 2018-12-01 MED ORDER — CIPROFLOXACIN HCL 500 MG PO TABS: 500.0000 mg | ORAL_TABLET | Freq: Two times a day (BID) | ORAL | 0 refills | Status: DC

## 2018-12-01 NOTE — Progress Notes (Addendum)
Established Patient Office Visit  Subjective:  Patient ID: [name], male    DOB: 1940-12-07  Age: 78 y.o. MRN: [id number]  CC:  Chief Complaint  Patient presents with  . Fever    HPI [name] presents for evaluation of elevated temperatures over the last 2 to 3 days.  Temperature has been running up to 100.  Usually okay in the morning but then elevates as the day progresses.  He does feel flushed in his face.  Status post cervical fusion on 9/30.  He has done relatively well postop.  Neck is doing okay but naturally remains a little stiff and sore..  Surgical wound is healing nicely.  There is been no drainage from that area.  While in the hospital he was diagnosed with a UTI with Klebsiella.  Organism had multidrug resistance.  He was treated with 7 days of Cipro.  He did proceed to the rehabilitation center after his surgery and was just discharged 7 days ago.  Patient denies any other localizing symptoms including URI symptoms, aberration of taste or smell cough or shortness of breath.  He has been moving his bowels normally.  He remains a little fatigued after his neck surgery at the end of last month.  Past Medical History:  Diagnosis Date  . Bruises easily    d/t being on Eliquis  . Carotid artery disease ([place])    a. Duplex 05/2014: 123456 RICA, 123456 LICA, elevated velocities in right subclavian artery, normal left subclavian artery..  . Chronic back pain    HNP  . Chronic kidney disease (CKD)   . CKD (chronic kidney disease)   . Claustrophobia    takes Ativan as needed  . 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 Coronary atherosclerosis of unspecified type of vessel, native or graft    a. cath in 2003 showed 10-20% LM, scattered 20% prox-mid LAD, 30% more distal LAD, 50-60% stenosis of LAD towards apex, 20% Cx, 30% dRCA, focal 95% stenosis in smaller of 2 branches of PDA, EF 60-65%.  . Diabetes mellitus ([place])    no longer- cancxelled by Dr  Hale Bogus  . Diarrhea    takes Imodium daily as needed  . Diverticulosis   . Diverticulosis of colon (without mention of hemorrhage)   . DJD (degenerative joint disease)   . DVT (deep venous thrombosis) ([place]) 07/2011  . Esophageal stricture   . Essential hypertension    takes Imdur and Lisinopril daily  . Gastric polyps    benign  . GERD (gastroesophageal reflux disease)    takes Nexium daily  . Glaucoma    uses eye drops  . History of blood transfusion `   leg amputation  . History of bronchitis    not sure when the last time  . History of colon polyps    benign  . History of kidney stones    litrotrispspy  . History of kidney stones    passed  . Insomnia    takes Melatonin nightly as needed  . Mixed hyperlipidemia    takes Crestor daily  . Myasthenia gravis ([place])    uses prednisone  . Osteoporosis 2016  . Peripheral edema    takes Lasix daily  . Peripheral vascular disease ([place])   . Pituitary tumor    checks Dr[facility]Walden at [facility] checks it every Jan   . Pneumonia 01/2016  . PONV (postoperative nausea and vomiting)   . Ptosis   .  Pulmonary embolus ([place]) 2012  . Stroke ([place]) 06/2015   x 3- last one 2018- balance issuses    Past Surgical History:  Procedure Laterality Date  . ABDOMINAL AORTOGRAM W/LOWER EXTREMITY N/A 11/18/2016   Procedure: ABDOMINAL AORTOGRAM W/LOWER EXTREMITY;  Surgeon: [name], MD;  Location: [place] [facility];  Service: Cardiovascular;  Laterality: N/A;  . AMPUTATION Right 02/07/2018   Procedure: right leg AMPUTATION ABOVE KNEE;  Surgeon: [name], MD;  Location: [place];  Service: Vascular;  Laterality: Right;  . ANTERIOR CERVICAL DECOMP/DISCECTOMY FUSION N/A 11/09/2018   Procedure: CERVICAL THREE-FOUR ANTERIOR CERVICAL DECOMPRESSION/DISCECTOMY FUSION WITH CERVICAL FIVE CORPECTOMY;  Surgeon: [name], MD;  Location: [place];  Service: Neurosurgery;  Laterality: N/A;  . AORTOGRAM Right 12/23/2017   Procedure:  AORTOGRAM, RIGHT LOWER EXTREMITY ANGIOGRAM, RIGHT SUPERFICIAL FEMORAL ARTERY ANGIOPLASTY WITH STENT, RIGHT POPLITEAL ARTERY ANGIOPLASTY WITH STENT, LEFT GROIN CUTDOWN AND LEFT FEMORAL ARTERY REPAIR;  Surgeon: [name], MD;  Location: [place];  Service: Vascular;  Laterality: Right;  . BACK SURGERY     x2, lumbar and cervical  . CARDIAC CATHETERIZATION    . CARPAL TUNNEL RELEASE Left 09/23/2018   Procedure: CARPAL TUNNEL RELEASE;  Surgeon: [name], MD;  Location: [place];  Service: Neurosurgery;  Laterality: Left;  CARPAL TUNNEL RELEASE  . CATARACT EXTRACTION Bilateral 12/12  . COLONOSCOPY    . ESOPHAGOGASTRODUODENOSCOPY ENDOSCOPY  04/2015  . IR GENERIC HISTORICAL  03/03/2016   IR IVC FILTER PLMT / S&I [name] GUID/MOD SED 03/03/2016 [name], MD [facility]  . IR GENERIC HISTORICAL  04/09/2016   IR RADIOLOGIST EVAL & MGMT 04/09/2016 [name], MD [facility] INTER[facility]  . IR GENERIC HISTORICAL  04/30/2016   IR IVC FILTER RETRIEVAL / S&I [name] GUID/MOD SED 04/30/2016 [name], MD [facility]INTER[facility]  . LITHOTRIPSY    . LUMBAR LAMINECTOMY/DECOMPRESSION MICRODISCECTOMY Right 09/14/2012   Procedure: Right Lumbar three-four, four-five, Lumbar five-Sacral one decompressive laminectomy;  Surgeon: [name], MD;  Location: [place] [facility];  Service: Neurosurgery;  Laterality: Right;  . LUMBAR LAMINECTOMY/DECOMPRESSION MICRODISCECTOMY Left 03/11/2016   Procedure: Left Lumbar Two-ThreeMicrodiscectomy;  Surgeon: [name], MD;  Location: [place];  Service: Neurosurgery;  Laterality: Left;  . PERIPHERAL VASCULAR INTERVENTION  11/18/2016   Procedure: PERIPHERAL VASCULAR INTERVENTION;  Surgeon: [name], MD;  Location: [place] [facility];  Service: Cardiovascular;;  Left popliteal  . SHOULDER SURGERY Left 05/07/2009  . TONSILLECTOMY    . ULNAR NERVE TRANSPOSITION Left 09/23/2018   Procedure: ULNAR NERVE DECOMPRESSION/TRANSPOSITION;  Surgeon: [name], MD;  Location: [place];  Service:  Neurosurgery;  Laterality: Left;  ULNAR NERVE DECOMPRESSION/TRANSPOSITION    Family History  Problem Relation Age of Onset  . Heart disease Mother   . Heart attack Mother   . Stroke Mother   . Heart disease Father   . Heart attack Father   . Stroke Father   . Breast cancer Sister        Twin   . Heart attack Sister   . Dementia Sister   . Heart disease Sister   . Heart attack Sister   . Clotting disorder Sister   . Heart attack Sister   . Colon cancer Neg Hx     Social History   Socioeconomic History  . Marital status: Married    Spouse name: Hassan Rowan  . Number of children: 2  . Years of education: 1-College  . Highest education level: Not  on file  Occupational History  . 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 Financial resource strain: Not on file  . Food insecurity    Worry: Not on file    Inability: Not on file  . Transportation needs    Medical: Not on file    Non-medical: Not on file  Tobacco Use  . Smoking status: Never Smoker  . Smokeless tobacco: Never Used  Substance and Sexual Activity  . Alcohol use: No    Alcohol/week: 0.0 standard drinks  . Drug use: No  . Sexual activity: Not Currently  Lifestyle  . Physical activity    Days per week: Not on file    Minutes per session: Not on file  . Stress: Not on file  Relationships  . Social Herbalist on phone: Patient refused    Gets together: Patient refused    Attends religious service: Patient refused    Active member of club or organization: Patient refused    Attends meetings of clubs or organizations: Patient refused    Relationship status: Patient refused  . Intimate partner violence    Fear of current or ex partner: Patient refused    Emotionally abused: Patient refused    Physically abused: Patient refused    Forced sexual activity: Patient refused  Other Topics Concern  . Not on file  Social History Narrative   Lives at home w/ his wife    Patient is right handed.   Patient has 1-2 cups of caffeine daily.    Outpatient Medications Prior to Visit  Medication Sig Dispense Refill  . acetaminophen (TYLENOL) 325 MG tablet Take 2 tablets (650 mg total) by mouth every 4 (four) hours as needed for mild pain ((score 1 to 3) or temp > 100.5).    Marland Kitchen apixaban (ELIQUIS) 2.5 MG TABS tablet Take 1 tablet (2.5 mg total) by mouth 2 (two) times daily. 60 tablet 1  . brimonidine (ALPHAGAN) 0.2 % ophthalmic solution Place 1 drop into both eyes 2 (two) times daily.    . cholecalciferol (VITAMIN D) 1000 units tablet Take 1,000 Units by mouth daily.     . diclofenac sodium (VOLTAREN) 1 % GEL Apply 2 g topically 3 (three) times daily as needed. 2 g 0  . diphenoxylate-atropine (LOMOTIL) 2.5-0.025 MG tablet Take 1 tablet by mouth every 6 (six) hours as needed for diarrhea or loose stools. 90 tablet 1  . furosemide (LASIX) 40 MG tablet Take 1 tablet (40 mg total) by mouth daily. 30 tablet 1  . gabapentin (NEURONTIN) 300 MG capsule Take 1 capsule (300 mg total) by mouth 3 (three) times daily. 90 capsule 1  . imatinib (GLEEVEC) 100 MG tablet Take 300 mg by mouth daily with lunch. Take with noon meal and a large glass of water.Caution:Chemotherapy    . isosorbide mononitrate (IMDUR) 30 MG 24 hr tablet Take 1 tablet (30 mg total) by mouth daily. 90 tablet 3  . lidocaine (LMX) 4 % cream Apply topically 4 (four) times daily. Apply to affected areas as directed 30 g 0  . loperamide (IMODIUM) 2 MG capsule Take 2 mg by mouth every 6 (six) hours as needed for diarrhea or loose stools.     . methocarbamol (ROBAXIN) 500 MG tablet Take 1 tablet (500 mg total) by mouth every 6 (six) hours as needed for muscle spasms. 60 tablet 0  . Multiple Vitamin (MULTIVITAMIN) tablet Take 1 tablet  by mouth daily.    . multivitamin-lutein (OCUVITE-LUTEIN) CAPS capsule Take 1 capsule by mouth daily.    Marland Kitchen oxyCODONE (OXY IR/ROXICODONE) 5 MG immediate release tablet Take 1 tablet (5 mg  total) by mouth every 3 (three) hours as needed for moderate pain ((score 4 to 6)). 30 tablet 0  . predniSONE (DELTASONE) 1 MG tablet Take 1 tablet (1 mg total) by mouth daily with breakfast. 30 tablet 0  . pyridostigmine (MESTINON) 60 MG tablet Take 60 mg by mouth daily.    . rosuvastatin (CRESTOR) 10 MG tablet Take 5 mg by mouth daily.    Marland Kitchen senna (SENOKOT) 8.6 MG TABS tablet Take 1 tablet (8.6 mg total) by mouth 2 (two) times daily. 120 tablet 0  . timolol (BETIMOL) 0.5 % ophthalmic solution Place 1 drop into both eyes 2 (two) times daily.     No facility-administered medications prior to visit.     Allergies  Allergen Reactions  . Other Other (See Comments)    Regarding scans, the PATIENT IS CLAUSTROPHOBIC!!!  . Phenergan [Promethazine Hcl] Swelling and Other (See Comments)    Cannot be combined with Zofran because swelling of the eyes resulted and patient became very restless-  . Zofran [Ondansetron Hcl] Swelling and Other (See Comments)    Cannot be combined with Phenergan because swelling of the eyes resulted and patient became very restless-  . Sulfamethoxazole Rash and Itching    ROS Review of Systems  Constitutional: Positive for fatigue. Negative for chills, diaphoresis, fever and unexpected weight change.  HENT: Negative.   Eyes: Negative for photophobia and visual disturbance.  Respiratory: Negative for cough, shortness of breath and wheezing.   Cardiovascular: Negative.   Gastrointestinal: Negative for constipation, nausea and vomiting.  Endocrine: Negative for polyphagia and polyuria.  Genitourinary: Negative for dysuria, flank pain, frequency and urgency.  Musculoskeletal: Positive for neck pain and neck stiffness.  Skin: Negative for pallor and rash.  Allergic/Immunologic: Negative for immunocompromised state.  Neurological: Negative for speech difficulty, weakness, light-headedness and headaches.  Hematological: Does not bruise/bleed easily.   Psychiatric/Behavioral: Negative.       Objective:    Physical Exam  Constitutional: He is oriented to person, place, and time. He appears well-developed and well-nourished. No distress.  HENT:  Head: Normocephalic and atraumatic.  Right Ear: External ear normal.  Left Ear: External ear normal.  Eyes: Conjunctivae are normal. Right eye exhibits no discharge. Left eye exhibits no discharge. No scleral icterus.  Neck: No JVD present. No tracheal deviation present.  Pulmonary/Chest: Effort normal. No stridor.    Neurological: He is alert and oriented to person, place, and time.  Skin: Skin is warm and dry. He is not diaphoretic.  Psychiatric: He has a normal mood and affect. His behavior is normal.    There were no vitals taken for this visit. Wt Readings from Last 3 Encounters:  12/07/18 150 lb (68 kg)  11/23/18 147 lb 11.3 oz (67 kg)  11/09/18 148 lb (67.1 kg)   BP Readings from Last 3 Encounters:  12/07/18 (!) 116/49  11/23/18 (!) 159/84  11/11/18 (!) 136/52   Guideline developer:  UpToDate (see UpToDate for funding source) Date Released: June 2014  Health Maintenance Due  Topic Date Due  . URINE MICROALBUMIN  07/19/2015  . OPHTHALMOLOGY EXAM  11/25/2017  . FOOT EXAM  04/07/2018    There are no preventive care reminders to display for this patient.  Lab Results  Component Value Date   TSH 0.51  09/27/2017   Lab Results  Component Value Date   WBC 13.4 (H) 11/22/2018   HGB 9.3 (L) 11/22/2018   HCT 28.1 (L) 11/22/2018   MCV 94.3 11/22/2018   PLT 180 11/22/2018   Lab Results  Component Value Date   NA 139 11/22/2018   K 3.9 11/22/2018   CO2 26 11/22/2018   GLUCOSE 134 (H) 11/22/2018   BUN 50 (H) 11/22/2018   CREATININE 1.61 (H) 11/22/2018   BILITOT 0.9 11/14/2018   ALKPHOS 48 11/14/2018   AST 25 11/14/2018   ALT 14 11/14/2018   PROT 5.5 (L) 11/14/2018   ALBUMIN 3.1 (L) 11/14/2018   CALCIUM 8.2 (L) 11/22/2018   ANIONGAP 14 11/22/2018   GFR 51.48  (L) 10/06/2017   Lab Results  Component Value Date   CHOL 134 07/20/2017   Lab Results  Component Value Date   HDL 33 (L) 07/20/2017   Lab Results  Component Value Date   LDLCALC 68 07/20/2017   Lab Results  Component Value Date   TRIG 166 (H) 07/20/2017   Lab Results  Component Value Date   CHOLHDL 4.1 07/20/2017   Lab Results  Component Value Date   HGBA1C 5.8 (H) 11/07/2018      Assessment & Plan:   Problem List Items Addressed This Visit      Genitourinary   Urinary tract infection without hematuria - Primary   Relevant Medications   ciprofloxacin (CIPRO) 500 MG tablet     Other   Fever      Meds ordered this encounter  Medications  . ciprofloxacin (CIPRO) 500 MG tablet    Sig: Take 1 tablet (500 mg total) by mouth 2 (two) times daily.    Dispense:  20 tablet    Refill:  0    Follow-up: Return in about 1 week (around 12/08/2018).   Patient will go for Covid test.  Follow-up in a week if not improving or go to the emergency room with a temperature of 100.5 or greater.  Virtual Visit via Video Note  I connected with Jacyn Muenchow Fandrich on 12/14/18 at  2:30 PM EDT by a video enabled telemedicine application and verified that I am speaking with the correct person using two identifiers.  Location: Patient: home Provider:    I discussed the limitations of evaluation and management by telemedicine and the availability of in person appointments. The patient expressed understanding and agreed to proceed.  History of Present Illness:    Observations/Objective:   Assessment and Plan:   Follow Up Instructions:    I discussed the assessment and treatment plan with the patient. The patient was provided an opportunity to ask questions and all were answered. The patient agreed with the plan and demonstrated an understanding of the instructions.   The patient was advised to call back or seek an in-person evaluation if the symptoms worsen or if the condition  fails to improve as anticipated.  I provided 25 minutes of non-face-to-face time during this encounter.   Libby Maw, MD

## 2018-12-02 ENCOUNTER — Other Ambulatory Visit: Payer: Self-pay

## 2018-12-02 DIAGNOSIS — Z20822 Contact with and (suspected) exposure to covid-19: Secondary | ICD-10-CM

## 2018-12-03 LAB — NOVEL CORONAVIRUS, NAA: SARS-CoV-2, NAA: NOT DETECTED

## 2018-12-05 ENCOUNTER — Telehealth: Payer: Self-pay

## 2018-12-05 NOTE — Telephone Encounter (Signed)
Izora Gala, OT from [facility] called requesting verbal orders for [facility] [id number]. Orders approved and given.

## 2018-12-07 ENCOUNTER — Encounter: Payer: Self-pay | Admitting: Physical Medicine & Rehabilitation

## 2018-12-07 ENCOUNTER — Telehealth: Payer: Self-pay | Admitting: *Deleted

## 2018-12-07 ENCOUNTER — Encounter: Payer: Medicare Other | Attending: Physical Medicine & Rehabilitation | Admitting: Physical Medicine & Rehabilitation

## 2018-12-07 ENCOUNTER — Other Ambulatory Visit: Payer: Self-pay

## 2018-12-07 VITALS — BP 116/49 | HR 85 | Temp 98.8°F | Ht 66.0 in | Wt 150.0 lb

## 2018-12-07 DIAGNOSIS — M5021 Other cervical disc displacement,  high cervical region: Secondary | ICD-10-CM | POA: Diagnosis not present

## 2018-12-07 DIAGNOSIS — M4802 Spinal stenosis, cervical region: Secondary | ICD-10-CM | POA: Diagnosis not present

## 2018-12-07 DIAGNOSIS — I130 Hypertensive heart and chronic kidney disease with heart failure and stage 1 through stage 4 chronic kidney disease, or unspecified chronic kidney disease: Secondary | ICD-10-CM | POA: Diagnosis not present

## 2018-12-07 DIAGNOSIS — M48061 Spinal stenosis, lumbar region without neurogenic claudication: Secondary | ICD-10-CM | POA: Diagnosis not present

## 2018-12-07 DIAGNOSIS — M5412 Radiculopathy, cervical region: Secondary | ICD-10-CM | POA: Diagnosis not present

## 2018-12-07 DIAGNOSIS — R112 Nausea with vomiting, unspecified: Secondary | ICD-10-CM | POA: Diagnosis not present

## 2018-12-07 DIAGNOSIS — M4712 Other spondylosis with myelopathy, cervical region: Secondary | ICD-10-CM | POA: Diagnosis not present

## 2018-12-07 MED ORDER — ONDANSETRON HCL 4 MG PO TABS: 4.0000 mg | ORAL_TABLET | Freq: Three times a day (TID) | ORAL | 1 refills | Status: AC | PRN

## 2018-12-07 NOTE — Telephone Encounter (Signed)
[name], HHPT, [facility] left a message asking for verbal orders for HHPT [id number]. Medical record reviewed. Social work note reviewed.  Verbal orders given per office protocol.

## 2018-12-07 NOTE — Patient Instructions (Addendum)
CIPRO: HOLD FOR 2 DAYS.  IF NAUSEA BETTER, RESUME AT 500MG  ONCE DAILY UNTIL GONE. IF NAUSEA RECURS AFTER RESUMING CIPRO, THEN YOU'LL HAVE TO STOP.   IF NAUSEA CONTINUES DESPITE STOPPING CIPRO, THEN YOU MAY NEED TO TALK TO YOUR ONCOLOGIST

## 2018-12-07 NOTE — Progress Notes (Signed)
Subjective:    Patient ID: [name], male    DOB: [birth year], 78 y.o.   MRN: [id number]  HPI   This a transitional care visit for [name]. He has felt "weak" since discharge. Therapy has come out once. He has had nausea and vomiting for the last 3 days. He is moving his bowels daily.   I asked [name] if he had any changes to his medications since being home and he denied.  However in reviewing his medications I saw that he is now back on Cipro which is dosed at 500 mg twice daily.  This was resumed for a fever and suspected UTI.  From a standpoint of his pain, he is doing much better.  The pain in the left medial thigh which radiates to the knee has improved although still is present.  He is taking gabapentin 300 mg 3 times daily.  He is using oxycodone occasionally for more severe pain.  He continues have weakness in his hands and states that they feel like "sandpaper".  He is waiting on a powered wheelchair.  Pain Inventory Average Pain 4 Pain Right Now 4 My pain is sharp, dull, stabbing, tingling and aching  In the last 24 hours, has pain interfered with the following? General activity 4 Relation with others 4 Enjoyment of life 6 What TIME of day is your pain at its worst? morning Sleep (in general) Fair  Pain is worse with: standing Pain improves with: medication Relief from Meds: 2  Mobility ability to climb steps?  no do you drive?  no  Function not employed: date last employed . retired I need assistance with the following:  dressing, bathing and meal prep  Neuro/Psych weakness tingling trouble walking  Prior Studies Any changes since last visit?  no  Physicians involved in your care Any changes since last visit?  no   Family History  Problem Relation Age of Onset   Heart disease Mother    Heart attack Mother    Stroke Mother    Heart disease Father    Heart attack Father    Stroke Father    Breast cancer Sister        Twin     Heart attack Sister    Dementia Sister    Heart disease Sister    Heart attack Sister    Clotting disorder Sister    Heart attack Sister    Colon cancer Neg Hx    Social History   Socioeconomic History   Marital status: Married    Spouse name: Hassan Rowan   Number of children: 2   Years of education: 1-College   Highest education level: Not on file  Occupational History   Occupation: retired    Fish farm manager: RETIRED    Comment: Retired  Scientist, product/process development strain: Not on file   Food insecurity    Worry: Not on file    Inability: Not on Lexicographer needs    Medical: Not on file    Non-medical: Not on file  Tobacco Use   Smoking status: Never Smoker   Smokeless tobacco: Never Used  Substance and Sexual Activity   Alcohol use: No    Alcohol/week: 0.0 standard drinks   Drug use: No   Sexual activity: Not Currently  Lifestyle   Physical activity    Days per week: Not on file    Minutes per session: Not on file   Stress: Not on file  Relationships   Social Herbalist on phone: Patient refused    Gets together: Patient refused    Attends religious service: Patient refused    Active member of club or organization: Patient refused    Attends meetings of clubs or organizations: Patient refused    Relationship status: Patient refused  Other Topics Concern   Not on file  Social History Narrative   Lives at home w/ his wife   Patient is right handed.   Patient has 1-2 cups of caffeine daily.   Past Surgical History:  Procedure Laterality Date   ABDOMINAL AORTOGRAM W/LOWER EXTREMITY N/A 11/18/2016   Procedure: ABDOMINAL AORTOGRAM W/LOWER EXTREMITY;  Surgeon: [name], MD;  Location: [place] [facility];  Service: Cardiovascular;  Laterality: N/A;   AMPUTATION Right 02/07/2018   Procedure: right leg AMPUTATION ABOVE KNEE;  Surgeon: [name], MD;  Location: [facility];  Service: Vascular;  Laterality:  Right;   ANTERIOR CERVICAL DECOMP/DISCECTOMY FUSION N/A 11/09/2018   Procedure: CERVICAL THREE-FOUR ANTERIOR CERVICAL DECOMPRESSION/DISCECTOMY FUSION WITH CERVICAL FIVE CORPECTOMY;  Surgeon: [name], MD;  Location: [place];  Service: Neurosurgery;  Laterality: N/A;   AORTOGRAM Right 12/23/2017   Procedure: AORTOGRAM, RIGHT LOWER EXTREMITY ANGIOGRAM, RIGHT SUPERFICIAL FEMORAL ARTERY ANGIOPLASTY WITH STENT, RIGHT POPLITEAL ARTERY ANGIOPLASTY WITH STENT, LEFT GROIN CUTDOWN AND LEFT FEMORAL ARTERY REPAIR;  Surgeon: [name], MD;  Location: [place];  Service: Vascular;  Laterality: Right;   BACK SURGERY     x2, lumbar and cervical   CARDIAC CATHETERIZATION     CARPAL TUNNEL RELEASE Left 09/23/2018   Procedure: CARPAL TUNNEL RELEASE;  Surgeon: [name], MD;  Location: [place];  Service: Neurosurgery;  Laterality: Left;  CARPAL TUNNEL RELEASE   CATARACT EXTRACTION Bilateral 12/12   COLONOSCOPY     ESOPHAGOGASTRODUODENOSCOPY ENDOSCOPY  04/2015   IR GENERIC HISTORICAL  03/03/2016   IR IVC FILTER PLMT / S&I [name] GUID/MOD SED 03/03/2016 [name], MD [facility]   IR GENERIC HISTORICAL  04/09/2016   IR RADIOLOGIST EVAL & MGMT 04/09/2016 [name], MD [facility]   IR GENERIC HISTORICAL  04/30/2016   IR IVC FILTER RETRIEVAL / S&I [name] GUID/MOD SED 04/30/2016 [name], MD [facility]   LITHOTRIPSY     LUMBAR LAMINECTOMY/DECOMPRESSION MICRODISCECTOMY Right 09/14/2012   Procedure: Right Lumbar three-four, four-five, Lumbar five-Sacral one decompressive laminectomy;  Surgeon: [name], MD;  Location: [place] [facility];  Service: Neurosurgery;  Laterality: Right;   LUMBAR LAMINECTOMY/DECOMPRESSION MICRODISCECTOMY Left 03/11/2016   Procedure: Left Lumbar Two-ThreeMicrodiscectomy;  Surgeon: [name], MD;  Location: [place];  Service: Neurosurgery;  Laterality: Left;   PERIPHERAL VASCULAR INTERVENTION  11/18/2016   Procedure: PERIPHERAL VASCULAR INTERVENTION;   Surgeon: [name], MD;  Location: [place] [facility];  Service: Cardiovascular;;  Left popliteal   SHOULDER SURGERY Left 05/07/2009   TONSILLECTOMY     ULNAR NERVE TRANSPOSITION Left 09/23/2018   Procedure: ULNAR NERVE DECOMPRESSION/TRANSPOSITION;  Surgeon: [name], MD;  Location: [place];  Service: Neurosurgery;  Laterality: Left;  ULNAR NERVE DECOMPRESSION/TRANSPOSITION   Past Medical History:  Diagnosis Date   Bruises easily    d/t being on Eliquis   Carotid artery disease ([place])    a. Duplex 05/2014: 123456 RICA, 123456 LICA, elevated velocities in right subclavian artery, normal left subclavian artery..   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 cath in 2003 showed 10-20% LM, scattered 20% prox-mid LAD, 30% more distal LAD, 50-60% stenosis of LAD towards apex, 20% Cx, 30% dRCA, focal 95% stenosis in smaller of 2 branches of PDA, EF 60-65%.   Diabetes mellitus ([place])    no longer- cancxelled by Dr Hale Bogus   Diarrhea    takes Imodium daily as needed   Diverticulosis    Diverticulosis of colon (without mention of hemorrhage)    DJD (degenerative joint disease)    DVT (deep venous thrombosis) ([place]) 07/2011   Esophageal stricture    Essential hypertension    takes Imdur and Lisinopril daily   Gastric polyps    benign   GERD (gastroesophageal reflux disease)    takes Nexium daily   Glaucoma    uses eye drops   History of blood transfusion `   leg amputation   History of bronchitis    not sure when the last time   History of colon polyps    benign   History of kidney stones    litrotrispspy   History of kidney stones    passed   Insomnia    takes Melatonin nightly as needed   Mixed hyperlipidemia     takes Crestor daily   Myasthenia gravis ([place])    uses prednisone   Osteoporosis 2016   Peripheral edema    takes Lasix daily   Peripheral vascular disease ([place])    Pituitary tumor    [facility] every Jan    Pneumonia 01/2016   PONV (postoperative nausea and vomiting)    Ptosis    Pulmonary embolus ([place]) 2012   Stroke ([place]) 06/2015   x 3- last one 2018- balance issuses   There were no vitals taken for this visit.  Opioid Risk Score:   Fall Risk Score:  `1  Depression screen PHQ 2/9  Depression screen [facility] 2/9 06/09/2018 03/03/2018 12/16/2017 09/16/2017  Decreased Interest 0 0 0 0  Down, Depressed, Hopeless 0 0 0 0  PHQ - 2 Score 0 0 0 0  Altered sleeping - 1 - -  Tired, decreased energy - 1 - -  Change in appetite - 1 - -  Feeling bad or failure about yourself  - 0 - -  Trouble concentrating - 0 - -  Moving slowly or fidgety/restless - 0 - -  Suicidal thoughts - 0 - -  PHQ-9 Score - 3 - -  Difficult doing work/chores - Not difficult at all - -  Some recent data might be hidden     Review of Systems  Constitutional: Positive for chills and fever.  HENT: Negative.   Eyes: Negative.   Respiratory: Negative.   Cardiovascular: Negative.   Gastrointestinal: Positive for nausea and vomiting.  Endocrine: Negative.   Genitourinary: Negative.   Musculoskeletal: Positive for gait problem.  Skin: Negative.   Allergic/Immunologic: Negative.   Neurological: Positive for weakness and numbness.  Hematological: Bruises/bleeds easily.  Psychiatric/Behavioral: Negative.   All other systems reviewed and are negative.      Objective:   Physical Exam Gen: no distress, normal appearing HEENT: oral mucosa pink and moist, NCAT Cardio: Reg rate Chest: normal effort, normal rate of breathing Abd: soft, non-distended Ext: no edema  Skin: intact Neuro: He is very hard of hearing.  Some short-term memory deficits.  Upper extremity motor is 4 out of 5  proximally at the deltoids biceps triceps and wrists, hand intrinsics are 3 - to 3 out of 5.  Is decreased sensation distally in both upper extremities.  Lower extremity strength is grossly 4 out of 5.  He has decreased sensation over the left medial thigh with dysesthesias although improved from prior examination.   Musculoskeletal: . Patrick's test negative on left for hip pain. Psych: pleasant, normal affect       Assessment & Plan:  Medical Problem List and Plan: 1.Decreased functional ability with upper extremity weaknesssecondary to cervical stenosis/HNP/severe myelopathy. Status post C3-4 anterior cervical corpectomy andAnterior cervical arthrodesis C3-4 4-5 and 5-6 11/09/2018.             HH therapies 2. Antithrombotics: -  Eliquis  3. Pain Management:Oxycodone prn              continue gabapentin 300mg  tid               Left lower extremity pain may be radicular. He has severe multifactorial and multi-level stenosis on MRI from 08/2018. However nerve root impingment doesn't really match pain pattern as the level involved most significantly on the left is L4.             -  xrays of left hip with patient. 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              -  Consider MRI of left hip/pelvis if pain does not improve although exam and history do not really point to this anymore.  9.Chronic myeloid leukemia.                 Gleevec   10. CKD stage III.   11. 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   13. Myasthenia gravis. Continue Mestinon and low-dose chronic prednisone 14. Hypertension.              PER PRIMARY 15. UTI              Cipro resumed per primary.   -would hold for 2 days d/t nausea and resume at half dose  -If nausea continues, he may need to discuss with his oncologist. 16. Neurogenic bowel:            bowel movements daily

## 2018-12-08 DIAGNOSIS — M5412 Radiculopathy, cervical region: Secondary | ICD-10-CM | POA: Diagnosis not present

## 2018-12-08 DIAGNOSIS — I130 Hypertensive heart and chronic kidney disease with heart failure and stage 1 through stage 4 chronic kidney disease, or unspecified chronic kidney disease: Secondary | ICD-10-CM | POA: Diagnosis not present

## 2018-12-08 DIAGNOSIS — M48061 Spinal stenosis, lumbar region without neurogenic claudication: Secondary | ICD-10-CM | POA: Diagnosis not present

## 2018-12-08 DIAGNOSIS — M5021 Other cervical disc displacement,  high cervical region: Secondary | ICD-10-CM | POA: Diagnosis not present

## 2018-12-08 DIAGNOSIS — M4712 Other spondylosis with myelopathy, cervical region: Secondary | ICD-10-CM | POA: Diagnosis not present

## 2018-12-08 DIAGNOSIS — M4802 Spinal stenosis, cervical region: Secondary | ICD-10-CM | POA: Diagnosis not present

## 2018-12-09 DIAGNOSIS — M5412 Radiculopathy, cervical region: Secondary | ICD-10-CM | POA: Diagnosis not present

## 2018-12-09 DIAGNOSIS — M5021 Other cervical disc displacement,  high cervical region: Secondary | ICD-10-CM | POA: Diagnosis not present

## 2018-12-09 DIAGNOSIS — I130 Hypertensive heart and chronic kidney disease with heart failure and stage 1 through stage 4 chronic kidney disease, or unspecified chronic kidney disease: Secondary | ICD-10-CM | POA: Diagnosis not present

## 2018-12-09 DIAGNOSIS — M4712 Other spondylosis with myelopathy, cervical region: Secondary | ICD-10-CM | POA: Diagnosis not present

## 2018-12-09 DIAGNOSIS — M4802 Spinal stenosis, cervical region: Secondary | ICD-10-CM | POA: Diagnosis not present

## 2018-12-09 DIAGNOSIS — M48061 Spinal stenosis, lumbar region without neurogenic claudication: Secondary | ICD-10-CM | POA: Diagnosis not present

## 2018-12-12 ENCOUNTER — Telehealth: Payer: Self-pay

## 2018-12-12 DIAGNOSIS — M5021 Other cervical disc displacement,  high cervical region: Secondary | ICD-10-CM | POA: Diagnosis not present

## 2018-12-12 DIAGNOSIS — M5412 Radiculopathy, cervical region: Secondary | ICD-10-CM | POA: Diagnosis not present

## 2018-12-12 DIAGNOSIS — I130 Hypertensive heart and chronic kidney disease with heart failure and stage 1 through stage 4 chronic kidney disease, or unspecified chronic kidney disease: Secondary | ICD-10-CM | POA: Diagnosis not present

## 2018-12-12 DIAGNOSIS — M4712 Other spondylosis with myelopathy, cervical region: Secondary | ICD-10-CM | POA: Diagnosis not present

## 2018-12-12 DIAGNOSIS — M4802 Spinal stenosis, cervical region: Secondary | ICD-10-CM | POA: Diagnosis not present

## 2018-12-12 DIAGNOSIS — M48061 Spinal stenosis, lumbar region without neurogenic claudication: Secondary | ICD-10-CM | POA: Diagnosis not present

## 2018-12-12 NOTE — Telephone Encounter (Signed)
Copied from [place] [id number]. Topic: Quick Communication - Home Health Verbal Orders >> Dec 09, 2018  2:17 PM Virl Axe D wrote: Caller/Agency: Estill Bamberg, RN/[facility] Number: [phone] Secure VM Requesting OT/PT/Skilled Nursing/Social Work/Speech Therapy: Skilled Nursing Frequency: 1 week 3/ 1 every other week 4  Requesting UA on pt next week to verify pt's infection is gone

## 2018-12-12 NOTE — Telephone Encounter (Signed)
Verbal given to [place].

## 2018-12-12 NOTE — Telephone Encounter (Signed)
Okay 

## 2018-12-14 DIAGNOSIS — I6529 Occlusion and stenosis of unspecified carotid artery: Secondary | ICD-10-CM

## 2018-12-14 DIAGNOSIS — I5032 Chronic diastolic (congestive) heart failure: Secondary | ICD-10-CM

## 2018-12-14 DIAGNOSIS — K5900 Constipation, unspecified: Secondary | ICD-10-CM

## 2018-12-14 DIAGNOSIS — I2510 Atherosclerotic heart disease of native coronary artery without angina pectoris: Secondary | ICD-10-CM

## 2018-12-14 DIAGNOSIS — E1122 Type 2 diabetes mellitus with diabetic chronic kidney disease: Secondary | ICD-10-CM

## 2018-12-14 DIAGNOSIS — K219 Gastro-esophageal reflux disease without esophagitis: Secondary | ICD-10-CM

## 2018-12-14 DIAGNOSIS — D631 Anemia in chronic kidney disease: Secondary | ICD-10-CM

## 2018-12-14 DIAGNOSIS — R509 Fever, unspecified: Secondary | ICD-10-CM | POA: Insufficient documentation

## 2018-12-14 DIAGNOSIS — G4700 Insomnia, unspecified: Secondary | ICD-10-CM

## 2018-12-14 DIAGNOSIS — N390 Urinary tract infection, site not specified: Secondary | ICD-10-CM | POA: Insufficient documentation

## 2018-12-14 DIAGNOSIS — M7032 Other bursitis of elbow, left elbow: Secondary | ICD-10-CM

## 2018-12-14 DIAGNOSIS — M4802 Spinal stenosis, cervical region: Secondary | ICD-10-CM

## 2018-12-14 DIAGNOSIS — E782 Mixed hyperlipidemia: Secondary | ICD-10-CM

## 2018-12-14 DIAGNOSIS — G7000 Myasthenia gravis without (acute) exacerbation: Secondary | ICD-10-CM

## 2018-12-14 DIAGNOSIS — M5021 Other cervical disc displacement,  high cervical region: Secondary | ICD-10-CM

## 2018-12-14 DIAGNOSIS — N183 Chronic kidney disease, stage 3 unspecified: Secondary | ICD-10-CM

## 2018-12-14 DIAGNOSIS — M5412 Radiculopathy, cervical region: Secondary | ICD-10-CM

## 2018-12-14 DIAGNOSIS — M4712 Other spondylosis with myelopathy, cervical region: Secondary | ICD-10-CM

## 2018-12-14 DIAGNOSIS — M48061 Spinal stenosis, lumbar region without neurogenic claudication: Secondary | ICD-10-CM

## 2018-12-14 DIAGNOSIS — E1165 Type 2 diabetes mellitus with hyperglycemia: Secondary | ICD-10-CM

## 2018-12-14 DIAGNOSIS — I130 Hypertensive heart and chronic kidney disease with heart failure and stage 1 through stage 4 chronic kidney disease, or unspecified chronic kidney disease: Secondary | ICD-10-CM

## 2018-12-14 DIAGNOSIS — E1151 Type 2 diabetes mellitus with diabetic peripheral angiopathy without gangrene: Secondary | ICD-10-CM

## 2018-12-14 DIAGNOSIS — H409 Unspecified glaucoma: Secondary | ICD-10-CM

## 2018-12-14 DIAGNOSIS — M810 Age-related osteoporosis without current pathological fracture: Secondary | ICD-10-CM

## 2018-12-14 DIAGNOSIS — H9193 Unspecified hearing loss, bilateral: Secondary | ICD-10-CM

## 2018-12-14 DIAGNOSIS — C9291 Myeloid leukemia, unspecified in remission: Secondary | ICD-10-CM

## 2018-12-15 DIAGNOSIS — M4802 Spinal stenosis, cervical region: Secondary | ICD-10-CM | POA: Diagnosis not present

## 2018-12-15 DIAGNOSIS — M5021 Other cervical disc displacement,  high cervical region: Secondary | ICD-10-CM | POA: Diagnosis not present

## 2018-12-15 DIAGNOSIS — I130 Hypertensive heart and chronic kidney disease with heart failure and stage 1 through stage 4 chronic kidney disease, or unspecified chronic kidney disease: Secondary | ICD-10-CM | POA: Diagnosis not present

## 2018-12-15 DIAGNOSIS — M5412 Radiculopathy, cervical region: Secondary | ICD-10-CM | POA: Diagnosis not present

## 2018-12-15 DIAGNOSIS — M48061 Spinal stenosis, lumbar region without neurogenic claudication: Secondary | ICD-10-CM | POA: Diagnosis not present

## 2018-12-15 DIAGNOSIS — M4712 Other spondylosis with myelopathy, cervical region: Secondary | ICD-10-CM | POA: Diagnosis not present

## 2018-12-16 ENCOUNTER — Telehealth: Payer: Self-pay

## 2018-12-16 ENCOUNTER — Emergency Department (HOSPITAL_COMMUNITY)
Admission: EM | Admit: 2018-12-16 | Discharge: 2018-12-16 | Disposition: A | Discharge status: Home / Self Care | Payer: Medicare Other | Attending: Emergency Medicine | Admitting: Emergency Medicine

## 2018-12-16 ENCOUNTER — Encounter: Payer: Self-pay | Admitting: Family Medicine

## 2018-12-16 ENCOUNTER — Encounter (HOSPITAL_COMMUNITY): Payer: Self-pay | Admitting: Emergency Medicine

## 2018-12-16 ENCOUNTER — Telehealth: Payer: Self-pay | Admitting: *Deleted

## 2018-12-16 ENCOUNTER — Other Ambulatory Visit: Payer: Self-pay

## 2018-12-16 ENCOUNTER — Ambulatory Visit (INDEPENDENT_AMBULATORY_CARE_PROVIDER_SITE_OTHER): Payer: Medicare Other | Admitting: Family Medicine

## 2018-12-16 DIAGNOSIS — E1122 Type 2 diabetes mellitus with diabetic chronic kidney disease: Secondary | ICD-10-CM | POA: Insufficient documentation

## 2018-12-16 DIAGNOSIS — C9210 Chronic myeloid leukemia, BCR/ABL-positive, not having achieved remission: Secondary | ICD-10-CM | POA: Insufficient documentation

## 2018-12-16 DIAGNOSIS — Z7901 Long term (current) use of anticoagulants: Secondary | ICD-10-CM | POA: Insufficient documentation

## 2018-12-16 DIAGNOSIS — Z79899 Other long term (current) drug therapy: Secondary | ICD-10-CM | POA: Insufficient documentation

## 2018-12-16 DIAGNOSIS — M7989 Other specified soft tissue disorders: Secondary | ICD-10-CM | POA: Insufficient documentation

## 2018-12-16 DIAGNOSIS — I82409 Acute embolism and thrombosis of unspecified deep veins of unspecified lower extremity: Secondary | ICD-10-CM | POA: Insufficient documentation

## 2018-12-16 DIAGNOSIS — I129 Hypertensive chronic kidney disease with stage 1 through stage 4 chronic kidney disease, or unspecified chronic kidney disease: Secondary | ICD-10-CM | POA: Insufficient documentation

## 2018-12-16 DIAGNOSIS — N390 Urinary tract infection, site not specified: Secondary | ICD-10-CM | POA: Diagnosis not present

## 2018-12-16 DIAGNOSIS — R300 Dysuria: Secondary | ICD-10-CM | POA: Diagnosis not present

## 2018-12-16 DIAGNOSIS — N183 Chronic kidney disease, stage 3 unspecified: Secondary | ICD-10-CM | POA: Insufficient documentation

## 2018-12-16 LAB — BASIC METABOLIC PANEL
Anion gap: 12 (ref 5–15)
BUN: 6 mg/dL — ABNORMAL LOW (ref 8–23)
CO2: 31 mmol/L (ref 22–32)
Calcium: 8.9 mg/dL (ref 8.9–10.3)
Chloride: 91 mmol/L — ABNORMAL LOW (ref 98–111)
Creatinine, Ser: 1.59 mg/dL — ABNORMAL HIGH (ref 0.61–1.24)
GFR calc Af Amer: 47 mL/min — ABNORMAL LOW (ref >60)
GFR calc non Af Amer: 40 mL/min — ABNORMAL LOW (ref >60)
Glucose, Bld: 123 mg/dL — ABNORMAL HIGH (ref 70–99)
Potassium: 3.5 mmol/L (ref 3.5–5.1)
Sodium: 134 mmol/L — ABNORMAL LOW (ref 135–145)

## 2018-12-16 LAB — CBC
HCT: 31.8 % — ABNORMAL LOW (ref 39.0–52.0)
Hemoglobin: 10.4 g/dL — ABNORMAL LOW (ref 13.0–17.0)
MCH: 31 pg (ref 26.0–34.0)
MCHC: 32.7 g/dL (ref 30.0–36.0)
MCV: 94.9 fL (ref 80.0–100.0)
Platelets: 504 10*3/uL — ABNORMAL HIGH (ref 150–400)
RBC: 3.35 MIL/uL — ABNORMAL LOW (ref 4.22–5.81)
RDW: 14 % (ref 11.5–15.5)
WBC: 7 10*3/uL (ref 4.0–10.5)
nRBC: 0 % (ref 0.0–0.2)

## 2018-12-16 LAB — URINALYSIS, ROUTINE W REFLEX MICROSCOPIC
Bilirubin Urine: NEGATIVE
Glucose, UA: NEGATIVE mg/dL
Hgb urine dipstick: NEGATIVE
Ketones, ur: NEGATIVE mg/dL
Leukocytes,Ua: NEGATIVE
Nitrite: NEGATIVE
Protein, ur: NEGATIVE mg/dL
Specific Gravity, Urine: 1.006 (ref 1.005–1.030)
pH: 7 (ref 5.0–8.0)

## 2018-12-16 MED ORDER — CEPHALEXIN 250 MG PO CAPS
500.0000 mg | ORAL_CAPSULE | Freq: Once | ORAL | Status: AC
  Administered 2018-12-16: 500 mg via ORAL
  Filled 2018-12-16: qty 2

## 2018-12-16 MED ORDER — PREDNISONE 10 MG PO TABS: 30.0000 mg | ORAL_TABLET | Freq: Every day | ORAL | 0 refills | Status: DC

## 2018-12-16 MED ORDER — CEPHALEXIN 500 MG PO CAPS: 500.0000 mg | ORAL_CAPSULE | Freq: Two times a day (BID) | ORAL | 0 refills | Status: DC

## 2018-12-16 MED ORDER — PREDNISONE 20 MG PO TABS
30.0000 mg | ORAL_TABLET | Freq: Once | ORAL | Status: AC
  Administered 2018-12-16: 30 mg via ORAL
  Filled 2018-12-16: qty 2

## 2018-12-16 NOTE — Telephone Encounter (Signed)
Called and left message for pt to call back, if no call back I will try again later

## 2018-12-16 NOTE — Telephone Encounter (Signed)
Copied from [place] [id number]. Topic: General - Other >> Dec 16, 2018  3:00 PM Rainey Pines A wrote: Patient would like a callback in regards to being directly admitted into hospital .  Patient is currently at ER waiting room and stated that the wait is 3 hours. Please advise.

## 2018-12-16 NOTE — Telephone Encounter (Signed)
Pt scheduled to see Dr. Ethelene Hal virtually at 11am.

## 2018-12-16 NOTE — Telephone Encounter (Signed)
I understand. They need to do the work up in the ER.

## 2018-12-16 NOTE — Telephone Encounter (Signed)
Copied from [place] [id number]. Topic: General - Other >> Dec 15, 2018  4:22 PM Yvette Rack wrote: Reason for CRM: Morrie Sheldon with [facility] called for pt lab results. Vida Roller also wanted pcp to be aware that pt had a temperature of 99.6 with fever and chills.

## 2018-12-16 NOTE — ED Provider Notes (Signed)
[facility] EMERGENCY DEPARTMENT Provider Note   CSN: [id number] Arrival date & time: 12/16/18  1446     History   Chief Complaint Chief Complaint  Patient presents with  . Urinary Tract Infection    HPI [name] is a 78 y.o. male.     The history is provided by the patient and medical records.   [name] is a 78 y.o. male who presents to the Emergency Department complaining of UTI.  He complains of low grade temperature (99.3) for the last 2-3 days.  He has mild dysuria for the last few days.  He describes chills as he fills cold in here.  No fevers, chest pain, cough, sob, n/v/d.  No known covid 19 exposures.   He was recently treated with cipro for UTI - last dose was completed a few days ago.  He has urinary frequency, no urgency.  Sxs are mild and constant in nature.   Past Medical History:  Diagnosis Date  . Bruises easily    d/t being on Eliquis  . Carotid artery disease ([place])    a. Duplex 05/2014: 123456 RICA, 123456 LICA, elevated velocities in right subclavian artery, normal left subclavian artery..  . Chronic back pain    HNP  . Chronic kidney disease (CKD)   . CKD (chronic kidney disease)   . Claustrophobia    takes Ativan as needed  . 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 Coronary atherosclerosis of unspecified type of vessel, native or graft    a. cath in 2003 showed 10-20% LM, scattered 20% prox-mid LAD, 30% more distal LAD, 50-60% stenosis of LAD towards apex, 20% Cx, 30% dRCA, focal 95% stenosis in smaller of 2 branches of PDA, EF 60-65%.  . Diabetes mellitus ([place])    no longer- cancxelled by Dr Hale Bogus  . Diarrhea    takes Imodium daily as needed  . Diverticulosis   . Diverticulosis of colon (without mention of hemorrhage)   . DJD (degenerative joint disease)   . DVT (deep venous thrombosis) ([place]) 07/2011  . Esophageal stricture   . Essential hypertension    takes Imdur and Lisinopril daily  .  Gastric polyps    benign  . GERD (gastroesophageal reflux disease)    takes Nexium daily  . Glaucoma    uses eye drops  . History of blood transfusion `   leg amputation  . History of bronchitis    not sure when the last time  . History of colon polyps    benign  . History of kidney stones    litrotrispspy  . History of kidney stones    passed  . Insomnia    takes Melatonin nightly as needed  . Mixed hyperlipidemia    takes Crestor daily  . Myasthenia gravis ([place])    uses prednisone  . Osteoporosis 2016  . Peripheral edema    takes Lasix daily  . Peripheral vascular disease ([place])   . Pituitary tumor    checks Dr[facility]Walden at [facility] checks it every Jan   . Pneumonia 01/2016  . PONV (postoperative nausea and vomiting)   . Ptosis   . Pulmonary embolus ([place]) 2012  . Stroke ([place]) 06/2015   x 3- last one 2018- balance issuses    Patient Active Problem List   Diagnosis Date Noted  . Fever 12/14/2018  . Urinary tract infection without hematuria 12/14/2018  . Benign essential HTN   .  Chronic myeloid leukemia ([place])   . Labile blood pressure   . Acute on chronic anemia   . Neuropathic pain   . Hypoalbuminemia due to protein-calorie malnutrition ([place])   . Steroid-induced hyperglycemia   . UTI due to Klebsiella species   . Cervical myelopathy ([place]) 11/11/2018  . S/P cervical spinal fusion 11/09/2018  . Neuropathic pain of right ankle 06/27/2018  . Insomnia due to medical condition 04/13/2018  . Atherosclerosis of native coronary artery of native heart without angina pectoris   . Hypokalemia   . Stage 3 chronic kidney disease   . 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 Acute blood loss anemia 02/08/2018  . Post-operative pain   . Unilateral AKA, right ([place])   . Ischemic foot 02/05/2018  . Ischemia of right lower extremity   . Pressure injury of skin 12/27/2017  . Arterial occlusive disease   . Postoperative pain   . CKD (chronic kidney disease), stage III   . History of  CVA (cerebrovascular accident)   . 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 Septic arthritis of left sternoclavicular joint ([place]) 11/03/2017  . Cervical spinal cord compression ([place]) 10/30/2017  . Nasal abscess 09/25/2017  . Immunocompromised ([place]) 09/25/2017  . Acute on chronic blood loss anemia 08/05/2017  . Transient ischemic attack (TIA) 07/23/2017  . Subcortical infarction ([place])   . Right hemiparesis ([place])   . Ataxia 07/21/2017  . TIA (transient ischemic attack)   . Wheezing 06/17/2017  . Bursitis of left elbow 05/01/2017  . Dystonia 12/29/2016  . PAD (peripheral artery disease) ([place]) 11/18/2016  . Osteoporosis 08/28/2016  . Paronychia of great toe, left 08/10/2016  . Carotid arterial disease ([place]) 07/09/2016  . Hypocalcemia 07/09/2016  . Diplopia 07/02/2016  . S/P lumbar laminectomy 03/11/2016  . Radicular pain of left lower extremity 02/21/2016  . Cough 01/10/2016  . 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 Myasthenia gravis ([place])   . Diabetes mellitus type 2 in nonobese (HCC)   . Ataxia, post-stroke   . Gait disturbance, post-stroke   . Proximal leg weakness   . Cervical spondylosis without myelopathy   . Achilles tendinitis of right lower extremity   . Chronic diastolic CHF (congestive heart failure) ([place])   . Gastroesophageal reflux disease without esophagitis   . 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 Cerebellar stroke ([place])   . Ischemic stroke ([place])   . Cerebrovascular accident (CVA) due to thrombosis of left vertebral artery ([place])   . History of pulmonary embolus (PE)   . Chronic anticoagulation   . HLD (hyperlipidemia)   . Neck pain 03/19/2015  . Pain in the chest   . Diabetes mellitus type II, controlled ([place])   . Diabetic gastroparesis ([place]) 03/07/2015  . Screening for osteoporosis 07/19/2014  . Dyspnea 04/09/2014  . Edema 04/09/2014  . Routine general medical examination at a health  care facility 10/23/2013  . Bilateral carotid bruits 05/24/2013  . Abnormal CT scan, lung 05/24/2013  . Encounter for therapeutic drug monitoring 03/14/2013  . Lymphadenopathy, inguinal 01/11/2013  . AKI (acute kidney injury) ([place]) 10/28/2012  . 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 Myasthenia gravis with exacerbation ([place]) 03/31/2012  . Disease of salivary gland 12/09/2011  . Left facial pain 10/28/2011  . Pulmonary embolism ([place]) 10/25/2011  . 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 GERD with stricture 09/23/2011  . Long term (current) use of anticoagulants 08/03/2011  . History of pulmonary embolism  07/30/2011  . History of DVT (deep vein thrombosis) 07/30/2011  . Subconjunctival hemorrhage 06/22/2011  . Bilateral conjunctivitis 04/09/2011  . Diabetes mellitus type 2, controlled ([place]) 12/18/2010  . Hearing loss 08/04/2010  . Ptosis of eyelid 12/30/2009  . Essential hypertension 04/24/2009  . HYPERLIPIDEMIA, MIXED 10/05/2008  . HEMORRHOIDS-INTERNAL 07/02/2008  . HEMORRHOIDS-EXTERNAL 07/02/2008  . DIVERTICULOSIS-COLON 07/02/2008  . PERSONAL HX COLONIC POLYPS 07/02/2008  . RASH-NONVESICULAR 02/07/2008  . PNEUMONIA 12/08/2007  . Chronic myeloid leukemia in remission ([place]) 07/07/2007  . CAD (coronary artery disease) 07/07/2007  . NEPHROLITHIASIS, HX OF 07/07/2007    Past Surgical History:  Procedure Laterality Date  . ABDOMINAL AORTOGRAM W/LOWER EXTREMITY N/A 11/18/2016   Procedure: ABDOMINAL AORTOGRAM W/LOWER EXTREMITY;  Surgeon: [name], MD;  Location: [facility];  Service: Cardiovascular;  Laterality: N/A;  . AMPUTATION Right 02/07/2018   Procedure: right leg AMPUTATION ABOVE KNEE;  Surgeon: [name], MD;  Location: [facility];  Service: Vascular;  Laterality: Right;  . ANTERIOR CERVICAL DECOMP/DISCECTOMY FUSION N/A 11/09/2018   Procedure: CERVICAL THREE-FOUR ANTERIOR CERVICAL DECOMPRESSION/DISCECTOMY FUSION WITH CERVICAL FIVE CORPECTOMY;   Surgeon: [name], MD;  Location: [facility];  Service: Neurosurgery;  Laterality: N/A;  . AORTOGRAM Right 12/23/2017   Procedure: AORTOGRAM, RIGHT LOWER EXTREMITY ANGIOGRAM, RIGHT SUPERFICIAL FEMORAL ARTERY ANGIOPLASTY WITH STENT, RIGHT POPLITEAL ARTERY ANGIOPLASTY WITH STENT, LEFT GROIN CUTDOWN AND LEFT FEMORAL ARTERY REPAIR;  Surgeon: [name], MD;  Location: [facility];  Service: Vascular;  Laterality: Right;  . BACK SURGERY     x2, lumbar and cervical  . CARDIAC CATHETERIZATION    . CARPAL TUNNEL RELEASE Left 09/23/2018   Procedure: CARPAL TUNNEL RELEASE;  Surgeon: [name], MD;  Location: [facility];  Service: Neurosurgery;  Laterality: Left;  CARPAL TUNNEL RELEASE  . CATARACT EXTRACTION Bilateral 12/12  . COLONOSCOPY    . ESOPHAGOGASTRODUODENOSCOPY ENDOSCOPY  04/2015  . IR GENERIC HISTORICAL  03/03/2016   IR IVC FILTER PLMT / S&I [name] GUID/MOD SED 03/03/2016 [name], MD [facility]  . IR GENERIC HISTORICAL  04/09/2016   IR RADIOLOGIST EVAL & MGMT 04/09/2016 [name], MD [facility]  . IR GENERIC HISTORICAL  04/30/2016   IR IVC FILTER RETRIEVAL / S&I [name] GUID/MOD SED 04/30/2016 [name], MD [facility]  . LITHOTRIPSY    . LUMBAR LAMINECTOMY/DECOMPRESSION MICRODISCECTOMY Right 09/14/2012   Procedure: Right Lumbar three-four, four-five, Lumbar five-Sacral one decompressive laminectomy;  Surgeon: [name], MD;  Location: [facility];  Service: Neurosurgery;  Laterality: Right;  . LUMBAR LAMINECTOMY/DECOMPRESSION MICRODISCECTOMY Left 03/11/2016   Procedure: Left Lumbar Two-ThreeMicrodiscectomy;  Surgeon: [name], MD;  Location: [facility];  Service: Neurosurgery;  Laterality: Left;  . PERIPHERAL VASCULAR INTERVENTION  11/18/2016   Procedure: PERIPHERAL VASCULAR INTERVENTION;  Surgeon: [name], MD;  Location: [facility];  Service: Cardiovascular;;  Left popliteal  . SHOULDER SURGERY Left 05/07/2009  . TONSILLECTOMY    . ULNAR NERVE  TRANSPOSITION Left 09/23/2018   Procedure: ULNAR NERVE DECOMPRESSION/TRANSPOSITION;  Surgeon: [name], MD;  Location: [facility];  Service: Neurosurgery;  Laterality: Left;  ULNAR NERVE DECOMPRESSION/TRANSPOSITION        Home Medications    Prior to Admission medications   Medication Sig Start Date End Date Taking? Authorizing Provider  acetaminophen (TYLENOL) 325 MG tablet Take 2 tablets (650 mg total) by mouth every 4 (four) hours as needed for mild pain ((score 1 to 3) or temp > 100.5).  11/22/18   Angiulli, Lavon Paganini, PA-C  apixaban (ELIQUIS) 2.5 MG TABS tablet Take 1 tablet (2.5 mg total) by mouth 2 (two) times daily. 11/22/18   Angiulli, Lavon Paganini, PA-C  brimonidine (ALPHAGAN) 0.2 % ophthalmic solution Place 1 drop into both eyes 2 (two) times daily.    [provider]  cephALEXin (KEFLEX) 500 MG capsule Take 1 capsule (500 mg total) by mouth 2 (two) times daily. 12/16/18   Quintella Reichert, MD  cholecalciferol (VITAMIN D) 1000 units tablet Take 1,000 Units by mouth daily.     [provider]  ciprofloxacin (CIPRO) 500 MG tablet Take 1 tablet (500 mg total) by mouth 2 (two) times daily. 12/01/18   Libby Maw, MD  diclofenac sodium (VOLTAREN) 1 % GEL Apply 2 g topically 3 (three) times daily as needed. 11/22/18   Angiulli, Lavon Paganini, PA-C  diphenoxylate-atropine (LOMOTIL) 2.5-0.025 MG tablet Take 1 tablet by mouth every 6 (six) hours as needed for diarrhea or loose stools. 05/19/18   Armbruster, Carlota Raspberry, MD  furosemide (LASIX) 40 MG tablet Take 1 tablet (40 mg total) by mouth daily. 11/23/18   Angiulli, Lavon Paganini, PA-C  gabapentin (NEURONTIN) 300 MG capsule Take 1 capsule (300 mg total) by mouth 3 (three) times daily. 11/22/18   Angiulli, Lavon Paganini, PA-C  imatinib (GLEEVEC) 100 MG tablet Take 300 mg by mouth daily with lunch. Take with noon meal and a large glass of water.Caution:Chemotherapy    [provider]  isosorbide mononitrate (IMDUR) 30 MG 24 hr  tablet Take 1 tablet (30 mg total) by mouth daily. 11/22/18   Angiulli, Lavon Paganini, PA-C  lidocaine (LMX) 4 % cream Apply topically 4 (four) times daily. Apply to affected areas as directed 11/22/18   Angiulli, Lavon Paganini, PA-C  loperamide (IMODIUM) 2 MG capsule Take 2 mg by mouth every 6 (six) hours as needed for diarrhea or loose stools.     [provider]  methocarbamol (ROBAXIN) 500 MG tablet Take 1 tablet (500 mg total) by mouth every 6 (six) hours as needed for muscle spasms. 11/22/18   Angiulli, Lavon Paganini, PA-C  Multiple Vitamin (MULTIVITAMIN) tablet Take 1 tablet by mouth daily.    [provider]  multivitamin-lutein (OCUVITE-LUTEIN) CAPS capsule Take 1 capsule by mouth daily.    [provider]  ondansetron (ZOFRAN) 4 MG tablet Take 1 tablet (4 mg total) by mouth every 8 (eight) hours as needed for nausea or vomiting. 12/07/18   Meredith Staggers, MD  oxyCODONE (OXY IR/ROXICODONE) 5 MG immediate release tablet Take 1 tablet (5 mg total) by mouth every 3 (three) hours as needed for moderate pain ((score 4 to 6)). 11/22/18   Angiulli, Lavon Paganini, PA-C  predniSONE (DELTASONE) 1 MG tablet Take 1 tablet (1 mg total) by mouth daily with breakfast. 11/23/18   Angiulli, Lavon Paganini, PA-C  predniSONE (DELTASONE) 10 MG tablet Take 3 tablets (30 mg total) by mouth daily. 12/16/18   Quintella Reichert, MD  pyridostigmine (MESTINON) 60 MG tablet Take 60 mg by mouth daily.    [provider]  rosuvastatin (CRESTOR) 10 MG tablet Take 5 mg by mouth daily.    [provider]  senna (SENOKOT) 8.6 MG TABS tablet Take 1 tablet (8.6 mg total) by mouth 2 (two) times daily. 11/22/18   Angiulli, Lavon Paganini, PA-C  timolol (BETIMOL) 0.5 % ophthalmic solution Place 1 drop into both eyes 2 (two) times daily.    [provider]  Family History Family History  Problem Relation Age of Onset  . Heart disease Mother   . Heart attack Mother   . Stroke Mother   . Heart disease  Father   . Heart attack Father   . Stroke Father   . Breast cancer Sister        Twin   . Heart attack Sister   . Dementia Sister   . Heart disease Sister   . Heart attack Sister   . Clotting disorder Sister   . Heart attack Sister   . Colon cancer Neg Hx     Social History Social History   Tobacco Use  . Smoking status: Never Smoker  . Smokeless tobacco: Never Used  Substance Use Topics  . Alcohol use: No    Alcohol/week: 0.0 standard drinks  . Drug use: No     Allergies   Other, Phenergan [promethazine hcl], Zofran [ondansetron hcl], and Sulfamethoxazole   Review of Systems Review of Systems  All other systems reviewed and are negative.    Physical Exam Updated Vital Signs BP (!) 100/30   Pulse 96   Temp 99.3 F (37.4 C) (Oral)   Resp 18   Ht 5\' 6"  (1.676 m)   Wt 68 kg   SpO2 99%   BMI 24.21 kg/m   Physical Exam Vitals signs and nursing note reviewed.  Constitutional:      Appearance: He is well-developed.  HENT:     Head: Normocephalic and atraumatic.  Cardiovascular:     Rate and Rhythm: Normal rate and regular rhythm.     Heart sounds: No murmur.  Pulmonary:     Effort: Pulmonary effort is normal. No respiratory distress.     Breath sounds: Normal breath sounds.  Abdominal:     Palpations: Abdomen is soft.     Tenderness: There is no abdominal tenderness. There is no guarding or rebound.  Genitourinary:    Comments: Uncircumcised penis. No rash.   Musculoskeletal:        General: No tenderness.     Comments: Right AKA.  1+ pitting edema to LLE.    Right second digit with erythema, edema and tenderness dorsally from the PIP joint to the MCP joint. No proximal erythema or edema. There is mildly decreased range of motion secondary to the edema but he is able to flex and extend the digit. He does have bogginess over both the PIP and MCP joints.  Skin:    General: Skin is warm and dry.  Neurological:     Mental Status: He is alert and  oriented to person, place, and time.  Psychiatric:        Behavior: Behavior normal.      ED Treatments / Results  Labs (all labs ordered are listed, but only abnormal results are displayed) Labs Reviewed  BASIC METABOLIC PANEL - Abnormal; Notable for the following components:      Result Value   Sodium 134 (*)    Chloride 91 (*)    Glucose, Bld 123 (*)    BUN 6 (*)    Creatinine, Ser 1.59 (*)    GFR calc non Af Amer 40 (*)    GFR calc Af Amer 47 (*)    All other components within normal limits  CBC - Abnormal; Notable for the following components:   RBC 3.35 (*)    Hemoglobin 10.4 (*)    HCT 31.8 (*)    Platelets 504 (*)    All other  components within normal limits  URINE CULTURE  URINALYSIS, ROUTINE W REFLEX MICROSCOPIC    EKG None  Radiology No results found.  Procedures Procedures (including critical care time)  Medications Ordered in ED Medications  cephALEXin (KEFLEX) capsule 500 mg (500 mg Oral Given 12/16/18 2338)  predniSONE (DELTASONE) tablet 30 mg (30 mg Oral Given 12/16/18 2338)     Initial Impression / Assessment and Plan / ED Course  I have reviewed the triage vital signs and the nursing notes.  Pertinent labs & imaging results that were available during my care of the patient were reviewed by me and considered in my medical decision making (see chart for details).        Patient here for evaluation of dysuria, temperature to 99 for the last few days. He is non-toxic appearing on evaluation and in no acute distress. Labs demonstrate stable renal insufficiency. UA is not consistent with UTI. No physical exam findings for cellulitis, balanitis, candidiasis. On repeat assessment patient reports that he incidentally has had pain and swelling to his right second digit for the last two days. He states that is located over the MCP and PIP joint dorsally. He is able to flex the joint but does have slight decreased range of motion secondary to swelling.  There is tenderness over the right second PIP joint and MCP joint. There is erythema to the PIP and MCP joint there is no ascending erythema or edema. There is moderate edema to the PIP joint extending to the MCP. Current clinical picture is unclear for cellulitis versus early gout. Will treat with both antibiotics and steroid bursts. Discussed with patient home care for finger pain and swelling as well as close return precautions. In terms of his dysuria, no evidence of urinary retention or UTI. Discussed outpatient follow-up for repeat evaluation.  Final Clinical Impressions(s) / ED Diagnoses   Final diagnoses:  Dysuria  Finger swelling    ED Discharge Orders         Ordered    cephALEXin (KEFLEX) 500 MG capsule  2 times daily     12/16/18 2320    predniSONE (DELTASONE) 10 MG tablet  Daily     12/16/18 2320           Quintella Reichert, MD 12/17/18 0004

## 2018-12-16 NOTE — Telephone Encounter (Signed)
Margaretha Sheffield PT from [facility] called to report a missed visit today.  She arrived at his home but he was not there.She called his mobile and he was in the ED for a UTI and will be admitted.

## 2018-12-16 NOTE — Telephone Encounter (Signed)
Copied from [facility] [id number]. Topic: General - Inquiry >> Dec 16, 2018  4:26 PM [name] wrote: Reason for CRM: Patient would like a callback in regards to being directly admitted into hospital .  Patient is currently at ER waiting room and stated that the wait is 3 hours. Please advise.

## 2018-12-16 NOTE — Telephone Encounter (Signed)
lmovm for [facility] letting her know that we have not received pt's urine results as of now. I asked for them to refax them or call us back with the results.

## 2018-12-16 NOTE — Progress Notes (Addendum)
Established Patient Office Visit  Subjective:  Patient ID: [name], male    DOB: Jun 01, 1940  Age: 77 y.o. MRN: [id number]  CC:  Chief Complaint  Patient presents with  . Fever  . Chills    HPI [name] presents for evaluation of ongoing urinary frequency and recent elevation in temperature up to 99.5 per visiting home health nurse.  Patient was seen last month on the 22nd of October virtually for similar symptoms.  He had been diagnosed with a UTI that grew out Klebsiella with multidrug resistance.  He had been treated for 7 days of Cipro upon discharge from that hospitalization for rehabilitation after his cervical fusion surgery on 30 September.  Patient was given 10 days of Cipro on the 22nd and asked to return to see me on the 29th.  He was lost to follow-up and stopped his Cipro after taking it for about 7 days because he was feeling better.  Patient says that visiting home health collect another urine sample from him this past Tuesday, November 3.  We see no results and visiting home health has been called.  Patient is having no respiratory tract symptoms no abdominal pain nausea or vomiting or diarrhea.  Denies stuffy nose drainage sore throat or cough.  There is no shortness of breath.  His neck continues to improve after his recent surgery but he has of course some lingering neck pain and stiffness.  Past Medical History:  Diagnosis Date  . Bruises easily    d/t being on Eliquis  . Carotid artery disease ([place])    a. Duplex 05/2014: 123456 RICA, 123456 LICA, elevated velocities in right subclavian artery, normal left subclavian artery..  . Chronic back pain    HNP  . Chronic kidney disease (CKD)   . CKD (chronic kidney disease)   . Claustrophobia    takes Ativan as needed  . 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 Coronary atherosclerosis of unspecified type of vessel, native or graft    a. cath in 2003 showed 10-20% LM, scattered 20% prox-mid  LAD, 30% more distal LAD, 50-60% stenosis of LAD towards apex, 20% Cx, 30% dRCA, focal 95% stenosis in smaller of 2 branches of PDA, EF 60-65%.  . Diabetes mellitus ([place])    no longer- cancxelled by Dr Hale Bogus  . Diarrhea    takes Imodium daily as needed  . Diverticulosis   . Diverticulosis of colon (without mention of hemorrhage)   . DJD (degenerative joint disease)   . DVT (deep venous thrombosis) ([place]) 07/2011  . Esophageal stricture   . Essential hypertension    takes Imdur and Lisinopril daily  . Gastric polyps    benign  . GERD (gastroesophageal reflux disease)    takes Nexium daily  . Glaucoma    uses eye drops  . History of blood transfusion `   leg amputation  . History of bronchitis    not sure when the last time  . History of colon polyps    benign  . History of kidney stones    litrotrispspy  . History of kidney stones    passed  . Insomnia    takes Melatonin nightly as needed  . Mixed hyperlipidemia    takes Crestor daily  . Myasthenia gravis ([place])    uses prednisone  . Osteoporosis 2016  . Peripheral edema    takes Lasix daily  . Peripheral vascular disease ([place])   .  Pituitary tumor    checks Dr[facility]Walden at [facility] checks it every Jan   . Pneumonia 01/2016  . PONV (postoperative nausea and vomiting)   . Ptosis   . Pulmonary embolus ([place]) 2012  . Stroke ([place]) 06/2015   x 3- last one 2018- balance issuses    Past Surgical History:  Procedure Laterality Date  . ABDOMINAL AORTOGRAM W/LOWER EXTREMITY N/A 11/18/2016   Procedure: ABDOMINAL AORTOGRAM W/LOWER EXTREMITY;  Surgeon: [name], MD;  Location: [facility];  Service: Cardiovascular;  Laterality: N/A;  . AMPUTATION Right 02/07/2018   Procedure: right leg AMPUTATION ABOVE KNEE;  Surgeon: [name], MD;  Location: [facility];  Service: Vascular;  Laterality: Right;  . ANTERIOR CERVICAL DECOMP/DISCECTOMY FUSION N/A 11/09/2018   Procedure: CERVICAL THREE-FOUR ANTERIOR CERVICAL  DECOMPRESSION/DISCECTOMY FUSION WITH CERVICAL FIVE CORPECTOMY;  Surgeon: [name], MD;  Location: [facility];  Service: Neurosurgery;  Laterality: N/A;  . AORTOGRAM Right 12/23/2017   Procedure: AORTOGRAM, RIGHT LOWER EXTREMITY ANGIOGRAM, RIGHT SUPERFICIAL FEMORAL ARTERY ANGIOPLASTY WITH STENT, RIGHT POPLITEAL ARTERY ANGIOPLASTY WITH STENT, LEFT GROIN CUTDOWN AND LEFT FEMORAL ARTERY REPAIR;  Surgeon: [name], MD;  Location: [facility];  Service: Vascular;  Laterality: Right;  . BACK SURGERY     x2, lumbar and cervical  . CARDIAC CATHETERIZATION    . CARPAL TUNNEL RELEASE Left 09/23/2018   Procedure: CARPAL TUNNEL RELEASE;  Surgeon: [name], MD;  Location: [facility];  Service: Neurosurgery;  Laterality: Left;  CARPAL TUNNEL RELEASE  . CATARACT EXTRACTION Bilateral 12/12  . COLONOSCOPY    . ESOPHAGOGASTRODUODENOSCOPY ENDOSCOPY  04/2015  . IR GENERIC HISTORICAL  03/03/2016   IR IVC FILTER PLMT / S&I [name] GUID/MOD SED 03/03/2016 [name], MD [facility]  . IR GENERIC HISTORICAL  04/09/2016   IR RADIOLOGIST EVAL & MGMT 04/09/2016 [name], MD [facility]  . IR GENERIC HISTORICAL  04/30/2016   IR IVC FILTER RETRIEVAL / S&I [name] GUID/MOD SED 04/30/2016 [name], MD [facility]  . LITHOTRIPSY    . LUMBAR LAMINECTOMY/DECOMPRESSION MICRODISCECTOMY Right 09/14/2012   Procedure: Right Lumbar three-four, four-five, Lumbar five-Sacral one decompressive laminectomy;  Surgeon: [name], MD;  Location: [facility];  Service: Neurosurgery;  Laterality: Right;  . LUMBAR LAMINECTOMY/DECOMPRESSION MICRODISCECTOMY Left 03/11/2016   Procedure: Left Lumbar Two-ThreeMicrodiscectomy;  Surgeon: [name], MD;  Location: [place];  Service: Neurosurgery;  Laterality: Left;  . PERIPHERAL VASCULAR INTERVENTION  11/18/2016   Procedure: PERIPHERAL VASCULAR INTERVENTION;  Surgeon: [name], MD;  Location: [facility];  Service: Cardiovascular;;  Left popliteal  . SHOULDER  SURGERY Left 05/07/2009  . TONSILLECTOMY    . ULNAR NERVE TRANSPOSITION Left 09/23/2018   Procedure: ULNAR NERVE DECOMPRESSION/TRANSPOSITION;  Surgeon: [name], MD;  Location: [facility];  Service: Neurosurgery;  Laterality: Left;  ULNAR NERVE DECOMPRESSION/TRANSPOSITION    Family History  Problem Relation Age of Onset  . Heart disease Mother   . Heart attack Mother   . Stroke Mother   . Heart disease Father   . Heart attack Father   . Stroke Father   . Breast cancer Sister        Twin   . Heart attack Sister   . Dementia Sister   . Heart disease Sister   . Heart attack Sister   . Clotting disorder Sister   . Heart attack Sister   . Colon cancer Neg Hx     Social History  Socioeconomic History  . Marital status: Married    Spouse name: Hassan Rowan  . Number of children: 2  . Years of education: 1-College  . Highest education level: Not on file  Occupational History  . 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 Financial resource strain: Not on file  . Food insecurity    Worry: Not on file    Inability: Not on file  . Transportation needs    Medical: Not on file    Non-medical: Not on file  Tobacco Use  . Smoking status: Never Smoker  . Smokeless tobacco: Never Used  Substance and Sexual Activity  . Alcohol use: No    Alcohol/week: 0.0 standard drinks  . Drug use: No  . Sexual activity: Not Currently  Lifestyle  . Physical activity    Days per week: Not on file    Minutes per session: Not on file  . Stress: Not on file  Relationships  . Social Herbalist on phone: Patient refused    Gets together: Patient refused    Attends religious service: Patient refused    Active member of club or organization: Patient refused    Attends meetings of clubs or organizations: Patient refused    Relationship status: Patient refused  . Intimate partner violence    Fear of current or ex partner: Patient refused    Emotionally  abused: Patient refused    Physically abused: Patient refused    Forced sexual activity: Patient refused  Other Topics Concern  . Not on file  Social History Narrative   Lives at home w/ his wife   Patient is right handed.   Patient has 1-2 cups of caffeine daily.    Outpatient Medications Prior to Visit  Medication Sig Dispense Refill  . acetaminophen (TYLENOL) 325 MG tablet Take 2 tablets (650 mg total) by mouth every 4 (four) hours as needed for mild pain ((score 1 to 3) or temp > 100.5).    Marland Kitchen apixaban (ELIQUIS) 2.5 MG TABS tablet Take 1 tablet (2.5 mg total) by mouth 2 (two) times daily. 60 tablet 1  . brimonidine (ALPHAGAN) 0.2 % ophthalmic solution Place 1 drop into both eyes 2 (two) times daily.    . cholecalciferol (VITAMIN D) 1000 units tablet Take 1,000 Units by mouth daily.     . ciprofloxacin (CIPRO) 500 MG tablet Take 1 tablet (500 mg total) by mouth 2 (two) times daily. 20 tablet 0  . diclofenac sodium (VOLTAREN) 1 % GEL Apply 2 g topically 3 (three) times daily as needed. 2 g 0  . diphenoxylate-atropine (LOMOTIL) 2.5-0.025 MG tablet Take 1 tablet by mouth every 6 (six) hours as needed for diarrhea or loose stools. 90 tablet 1  . furosemide (LASIX) 40 MG tablet Take 1 tablet (40 mg total) by mouth daily. 30 tablet 1  . gabapentin (NEURONTIN) 300 MG capsule Take 1 capsule (300 mg total) by mouth 3 (three) times daily. 90 capsule 1  . imatinib (GLEEVEC) 100 MG tablet Take 300 mg by mouth daily with lunch. Take with noon meal and a large glass of water.Caution:Chemotherapy    . isosorbide mononitrate (IMDUR) 30 MG 24 hr tablet Take 1 tablet (30 mg total) by mouth daily. 90 tablet 3  . lidocaine (LMX) 4 % cream Apply topically 4 (four) times daily. Apply to affected areas as directed 30 g 0  . loperamide (IMODIUM) 2 MG capsule Take 2  mg by mouth every 6 (six) hours as needed for diarrhea or loose stools.     . methocarbamol (ROBAXIN) 500 MG tablet Take 1 tablet (500 mg total) by  mouth every 6 (six) hours as needed for muscle spasms. 60 tablet 0  . Multiple Vitamin (MULTIVITAMIN) tablet Take 1 tablet by mouth daily.    . multivitamin-lutein (OCUVITE-LUTEIN) CAPS capsule Take 1 capsule by mouth daily.    . ondansetron (ZOFRAN) 4 MG tablet Take 1 tablet (4 mg total) by mouth every 8 (eight) hours as needed for nausea or vomiting. 20 tablet 1  . oxyCODONE (OXY IR/ROXICODONE) 5 MG immediate release tablet Take 1 tablet (5 mg total) by mouth every 3 (three) hours as needed for moderate pain ((score 4 to 6)). 30 tablet 0  . predniSONE (DELTASONE) 1 MG tablet Take 1 tablet (1 mg total) by mouth daily with breakfast. 30 tablet 0  . pyridostigmine (MESTINON) 60 MG tablet Take 60 mg by mouth daily.    . rosuvastatin (CRESTOR) 10 MG tablet Take 5 mg by mouth daily.    Marland Kitchen senna (SENOKOT) 8.6 MG TABS tablet Take 1 tablet (8.6 mg total) by mouth 2 (two) times daily. 120 tablet 0  . timolol (BETIMOL) 0.5 % ophthalmic solution Place 1 drop into both eyes 2 (two) times daily.     No facility-administered medications prior to visit.     Allergies  Allergen Reactions  . Other Other (See Comments)    Regarding scans, the PATIENT IS CLAUSTROPHOBIC!!!  . Phenergan [Promethazine Hcl] Swelling and Other (See Comments)    Cannot be combined with Zofran because swelling of the eyes resulted and patient became very restless-  . Zofran [Ondansetron Hcl] Swelling and Other (See Comments)    Cannot be combined with Phenergan because swelling of the eyes resulted and patient became very restless-  . Sulfamethoxazole Rash and Itching    ROS Review of Systems  Constitutional: Positive for chills. Negative for diaphoresis, fatigue, fever and unexpected weight change.  HENT: Negative.   Eyes: Negative for photophobia and visual disturbance.  Respiratory: Negative.  Negative for cough, shortness of breath and wheezing.   Cardiovascular: Negative.   Gastrointestinal: Negative for abdominal pain,  nausea and vomiting.  Endocrine: Negative for polyphagia.  Genitourinary: Positive for frequency. Negative for dysuria and urgency.  Musculoskeletal: Positive for gait problem, neck pain and neck stiffness.  Allergic/Immunologic: Negative for immunocompromised state.  Neurological: Negative for light-headedness and headaches.  Psychiatric/Behavioral: Negative.       Objective:    Physical Exam  Constitutional: He is oriented to person, place, and time. He appears well-developed and well-nourished. No distress.  HENT:  Head: Normocephalic and atraumatic.  Right Ear: External ear normal.  Left Ear: External ear normal.  Eyes: Right eye exhibits no discharge. Left eye exhibits no discharge. No scleral icterus.  Neck: No tracheal deviation present.  Pulmonary/Chest: Effort normal. No stridor.  Neurological: He is alert and oriented to person, place, and time.  Skin: He is not diaphoretic.  Psychiatric: He has a normal mood and affect. His behavior is normal.    There were no vitals taken for this visit. Wt Readings from Last 3 Encounters:  12/07/18 150 lb (68 kg)  11/23/18 147 lb 11.3 oz (67 kg)  11/09/18 148 lb (67.1 kg)   BP Readings from Last 3 Encounters:  12/07/18 (!) 116/49  11/23/18 (!) 159/84  11/11/18 (!) 136/52   Guideline developer:  UpToDate (see UpToDate for funding source)  Date Released: June 2014  Health Maintenance Due  Topic Date Due  . URINE MICROALBUMIN  07/19/2015  . OPHTHALMOLOGY EXAM  11/25/2017  . FOOT EXAM  04/07/2018    There are no preventive care reminders to display for this patient.  Lab Results  Component Value Date   TSH 0.51 09/27/2017   Lab Results  Component Value Date   WBC 13.4 (H) 11/22/2018   HGB 9.3 (L) 11/22/2018   HCT 28.1 (L) 11/22/2018   MCV 94.3 11/22/2018   PLT 180 11/22/2018   Lab Results  Component Value Date   NA 139 11/22/2018   K 3.9 11/22/2018   CO2 26 11/22/2018   GLUCOSE 134 (H) 11/22/2018   BUN 50 (H)  11/22/2018   CREATININE 1.61 (H) 11/22/2018   BILITOT 0.9 11/14/2018   ALKPHOS 48 11/14/2018   AST 25 11/14/2018   ALT 14 11/14/2018   PROT 5.5 (L) 11/14/2018   ALBUMIN 3.1 (L) 11/14/2018   CALCIUM 8.2 (L) 11/22/2018   ANIONGAP 14 11/22/2018   GFR 51.48 (L) 10/06/2017   Lab Results  Component Value Date   CHOL 134 07/20/2017   Lab Results  Component Value Date   HDL 33 (L) 07/20/2017   Lab Results  Component Value Date   LDLCALC 68 07/20/2017   Lab Results  Component Value Date   TRIG 166 (H) 07/20/2017   Lab Results  Component Value Date   CHOLHDL 4.1 07/20/2017   Lab Results  Component Value Date   HGBA1C 5.8 (H) 11/07/2018      Assessment & Plan:   Problem List Items Addressed This Visit      Genitourinary   Urinary tract infection without hematuria - Primary      No orders of the defined types were placed in this encounter.   Follow-up: No follow-ups on file.   Suspect ongoing UTI with possible multi drug resistant organism and under treatment.  Patient advised to go to the emergency room for consideration of admission and treatment.  Virtual Visit via Video Note  I connected with [name] on 12/16/18 at 11:00 AM EST by a video enabled telemedicine application and verified that I am speaking with the correct person using two identifiers.  Location: Patient: home Provider:   I discussed the limitations of evaluation and management by telemedicine and the availability of in person appointments. The patient expressed understanding and agreed to proceed.  History of Present Illness:    Observations/Objective:   Assessment and Plan:   Follow Up Instructions:    I discussed the assessment and treatment plan with the patient. The patient was provided an opportunity to ask questions and all were answered. The patient agreed with the plan and demonstrated an understanding of the instructions.   The patient was advised to call back or seek  an in-person evaluation if the symptoms worsen or if the condition fails to improve as anticipated.  I provided 22 minutes of non-face-to-face time during this encounter.   Libby Maw, MD  Virtual Visit via Video Note  I connected with [name] on 12/16/18 at 11:00 AM EST by a video enabled telemedicine application and verified that I am speaking with the correct person using two identifiers.  Location: Patient: home Provider:    I discussed the limitations of evaluation and management by telemedicine and the availability of in person appointments. The patient expressed understanding and agreed to proceed.  History of Present Illness:    Observations/Objective:  Assessment and Plan:   Follow Up Instructions:    I discussed the assessment and treatment plan with the patient. The patient was provided an opportunity to ask questions and all were answered. The patient agreed with the plan and demonstrated an understanding of the instructions.   The patient was advised to call back or seek an in-person evaluation if the symptoms worsen or if the condition fails to improve as anticipated.  I provided 25 minutes of non-face-to-face time during this encounter.   Libby Maw, MD

## 2018-12-16 NOTE — Telephone Encounter (Signed)
We can set him up for a virtual.

## 2018-12-16 NOTE — Telephone Encounter (Signed)
Can we get pt set up for a virtual for his temperature & chills?

## 2018-12-16 NOTE — Discharge Instructions (Addendum)
You are seen in the emergency department today for painful urination. Your urinalysis does not appear to have any signs of infection but this was sent for culture. You will receive a call back if it goes out bacteria. Your finger is red and swollen today, it is unclear if this is the start of the bacterial infection or if this related to gout. The antibiotics are for possible infection and the prednisone is for possible gout flare. Please get rechecked immediately if you have worsening swelling, pain or fevers.

## 2018-12-16 NOTE — Progress Notes (Signed)
Consult request has been received. CSW attempting to follow up at present time  Anvay Tennis M. Sidra Oldfield LCSWA Transitions of Care  Clinical Social Worker  Ph: [phone] 

## 2018-12-16 NOTE — ED Triage Notes (Signed)
Pt sent by PCP for recurrent UTI. Endorses chills, frequent urinating. States it burns to pee.

## 2018-12-16 NOTE — Telephone Encounter (Signed)
Spoke with pt. He is aware that ER is doing a work up and that results are coming back in. Advised him to give it a little while longer & nurse or doctor should update him.

## 2018-12-18 LAB — URINE CULTURE: Culture: NO GROWTH

## 2018-12-20 ENCOUNTER — Encounter: Payer: Self-pay | Admitting: Family Medicine

## 2018-12-20 ENCOUNTER — Other Ambulatory Visit: Payer: Self-pay

## 2018-12-20 ENCOUNTER — Ambulatory Visit (INDEPENDENT_AMBULATORY_CARE_PROVIDER_SITE_OTHER): Payer: Medicare Other | Admitting: Family Medicine

## 2018-12-20 VITALS — Temp 97.4°F | Ht 66.0 in

## 2018-12-20 DIAGNOSIS — M4802 Spinal stenosis, cervical region: Secondary | ICD-10-CM | POA: Diagnosis not present

## 2018-12-20 DIAGNOSIS — M4712 Other spondylosis with myelopathy, cervical region: Secondary | ICD-10-CM | POA: Diagnosis not present

## 2018-12-20 DIAGNOSIS — R350 Frequency of micturition: Secondary | ICD-10-CM | POA: Diagnosis not present

## 2018-12-20 DIAGNOSIS — I130 Hypertensive heart and chronic kidney disease with heart failure and stage 1 through stage 4 chronic kidney disease, or unspecified chronic kidney disease: Secondary | ICD-10-CM | POA: Diagnosis not present

## 2018-12-20 DIAGNOSIS — M5412 Radiculopathy, cervical region: Secondary | ICD-10-CM | POA: Diagnosis not present

## 2018-12-20 DIAGNOSIS — M62838 Other muscle spasm: Secondary | ICD-10-CM

## 2018-12-20 DIAGNOSIS — M48061 Spinal stenosis, lumbar region without neurogenic claudication: Secondary | ICD-10-CM | POA: Diagnosis not present

## 2018-12-20 DIAGNOSIS — M5021 Other cervical disc displacement,  high cervical region: Secondary | ICD-10-CM | POA: Diagnosis not present

## 2018-12-20 MED ORDER — METHOCARBAMOL 500 MG PO TABS: 500.0000 mg | ORAL_TABLET | Freq: Four times a day (QID) | ORAL | 0 refills | Status: AC | PRN

## 2018-12-20 NOTE — Progress Notes (Signed)
Established Patient Office Visit  Subjective:  Patient ID: [name], male    DOB: Feb 25, 1940  Age: 78 y.o. MRN: [id number]  CC:  Chief Complaint  Patient presents with   Follow-up    follow up after hospital visit pt needs med refill on gabapentin.    HPI Haymond Lipschutz Krupp presents for follow-up of his recent ER visit for urinary frequency.  Urinalysis and culture were essentially negative.  ER doctor had noted a inflamed phalangeal joint and start the patient on Keflex.  Patient says that the inflammation has decreased with treatment.  He will be sure to complete his Keflex course of therapy.  Says that he learned his lesson with his recent UTI.  He requests refill on the Robaxin for neck spasms.  Says that the drug has helped him and he uses on a as needed basis.  Past Medical History:  Diagnosis Date   Bruises easily    d/t being on Eliquis   Carotid artery disease ([place])    a. Duplex 05/2014: 123456 RICA, 123456 LICA, elevated velocities in right subclavian artery, normal left subclavian artery..   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 cath in 2003 showed 10-20% LM, scattered 20% prox-mid LAD, 30% more distal LAD, 50-60% stenosis of LAD towards apex, 20% Cx, 30% dRCA, focal 95% stenosis in smaller of 2 branches of PDA, EF 60-65%.   Diabetes mellitus ([place])    no longer- cancxelled by Dr Hale Bogus   Diarrhea    takes Imodium daily as needed   Diverticulosis    Diverticulosis of colon (without mention of hemorrhage)    DJD (degenerative joint disease)    DVT (deep venous thrombosis) ([place]) 07/2011   Esophageal stricture    Essential hypertension    takes Imdur and Lisinopril daily   Gastric polyps    benign   GERD  (gastroesophageal reflux disease)    takes Nexium daily   Glaucoma    uses eye drops   History of blood transfusion `   leg amputation   History of bronchitis    not sure when the last time   History of colon polyps    benign   History of kidney stones    litrotrispspy   History of kidney stones    passed   Insomnia    takes Melatonin nightly as needed   Mixed hyperlipidemia    takes Crestor daily   Myasthenia gravis ([place])    uses prednisone   Osteoporosis 2016   Peripheral edema    takes Lasix daily   Peripheral vascular disease ([place])    Pituitary tumor    [facility] Dr[facility]Walden [facility] checks it every Jan    Pneumonia 01/2016   PONV (postoperative nausea and vomiting)    Ptosis    Pulmonary embolus ([place]) 2012   Stroke ([place]) 06/2015   x 3- last one 2018- balance issuses    Past Surgical History:  Procedure Laterality Date   ABDOMINAL AORTOGRAM W/LOWER EXTREMITY N/A 11/18/2016   Procedure: ABDOMINAL AORTOGRAM W/LOWER EXTREMITY;  Surgeon: [name], MD;  Location: [facility];  Service: Cardiovascular;  Laterality: N/A;   AMPUTATION Right 02/07/2018  Procedure: right leg AMPUTATION ABOVE KNEE;  Surgeon: [name], MD;  Location: [facility] OR;  Service: Vascular;  Laterality: Right;   ANTERIOR CERVICAL DECOMP/DISCECTOMY FUSION N/A 11/09/2018   Procedure: CERVICAL THREE-FOUR ANTERIOR CERVICAL DECOMPRESSION/DISCECTOMY FUSION WITH CERVICAL FIVE CORPECTOMY;  Surgeon: [name], MD;  Location: [place];  Service: Neurosurgery;  Laterality: N/A;   AORTOGRAM Right 12/23/2017   Procedure: AORTOGRAM, RIGHT LOWER EXTREMITY ANGIOGRAM, RIGHT SUPERFICIAL FEMORAL ARTERY ANGIOPLASTY WITH STENT, RIGHT POPLITEAL ARTERY ANGIOPLASTY WITH STENT, LEFT GROIN CUTDOWN AND LEFT FEMORAL ARTERY REPAIR;  Surgeon: [name], MD;  Location: [facility];  Service: Vascular;  Laterality: Right;   BACK SURGERY     x2, lumbar and cervical   CARDIAC  CATHETERIZATION     CARPAL TUNNEL RELEASE Left 09/23/2018   Procedure: CARPAL TUNNEL RELEASE;  Surgeon: [name], MD;  Location: [place] [facility];  Service: Neurosurgery;  Laterality: Left;  CARPAL TUNNEL RELEASE   CATARACT EXTRACTION Bilateral 12/12   COLONOSCOPY     ESOPHAGOGASTRODUODENOSCOPY ENDOSCOPY  04/2015   IR GENERIC HISTORICAL  03/03/2016   IR IVC FILTER PLMT / S&I [name] GUID/MOD SED 03/03/2016 [name], MD [facility]   IR GENERIC HISTORICAL  04/09/2016   IR RADIOLOGIST EVAL & MGMT 04/09/2016 [name], MD [facility]   IR GENERIC HISTORICAL  04/30/2016   IR IVC FILTER RETRIEVAL / S&I [name] GUID/MOD SED 04/30/2016 [name], MD [facility]   LITHOTRIPSY     LUMBAR LAMINECTOMY/DECOMPRESSION MICRODISCECTOMY Right 09/14/2012   Procedure: Right Lumbar three-four, four-five, Lumbar five-Sacral one decompressive laminectomy;  Surgeon: [name], MD;  Location: [facility];  Service: Neurosurgery;  Laterality: Right;   LUMBAR LAMINECTOMY/DECOMPRESSION MICRODISCECTOMY Left 03/11/2016   Procedure: Left Lumbar Two-ThreeMicrodiscectomy;  Surgeon: [name], MD;  Location: [facility];  Service: Neurosurgery;  Laterality: Left;   PERIPHERAL VASCULAR INTERVENTION  11/18/2016   Procedure: PERIPHERAL VASCULAR INTERVENTION;  Surgeon: [name], MD;  Location: [facility];  Service: Cardiovascular;;  Left popliteal   SHOULDER SURGERY Left 05/07/2009   TONSILLECTOMY     ULNAR NERVE TRANSPOSITION Left 09/23/2018   Procedure: ULNAR NERVE DECOMPRESSION/TRANSPOSITION;  Surgeon: [name], MD;  Location: [facility];  Service: Neurosurgery;  Laterality: Left;  ULNAR NERVE DECOMPRESSION/TRANSPOSITION    Family History  Problem Relation Age of Onset   Heart disease Mother    Heart attack Mother    Stroke Mother    Heart disease Father    Heart attack Father    Stroke Father    Breast cancer Sister        Twin    Heart attack Sister    Dementia Sister      Heart disease Sister    Heart attack Sister    Clotting disorder Sister    Heart attack Sister    Colon cancer Neg Hx     Social History   Socioeconomic History   Marital status: Married    Spouse name: Hassan Rowan   Number of children: 2   Years of education: 1-College   Highest education level: Not on file  Occupational History   Occupation: retired    Fish farm manager: RETIRED    Comment: Retired  Scientist, product/process development strain: Not on file   Food insecurity    Worry: Not on file    Inability: Not on Lexicographer needs    Medical: Not on file    Non-medical: Not on file  Tobacco Use   Smoking status: Never Smoker   Smokeless tobacco: Never Used  Substance and Sexual Activity   Alcohol use: No    Alcohol/week: 0.0 standard drinks   Drug use: No   Sexual activity: Not Currently  Lifestyle   Physical activity    Days per week: Not on file    Minutes per session: Not on file   Stress: Not on file  Relationships   Social connections    Talks on phone: Patient refused    Gets together: Patient refused    Attends religious service: Patient refused    Active member of club or organization: Patient refused    Attends meetings of clubs or organizations: Patient refused    Relationship status: Patient refused   Intimate partner violence    Fear of current or ex partner: Patient refused    Emotionally abused: Patient refused    Physically abused: Patient refused    Forced sexual activity: Patient refused  Other Topics Concern   Not on file  Social History Narrative   Lives at home w/ his wife   Patient is right handed.   Patient has 1-2 cups of caffeine daily.    Outpatient Medications Prior to Visit  Medication Sig Dispense Refill   acetaminophen (TYLENOL) 325 MG tablet Take 2 tablets (650 mg total) by mouth every 4 (four) hours as needed for mild pain ((score 1 to 3) or temp > 100.5).     apixaban (ELIQUIS) 2.5 MG TABS tablet  Take 1 tablet (2.5 mg total) by mouth 2 (two) times daily. 60 tablet 1   brimonidine (ALPHAGAN) 0.2 % ophthalmic solution Place 1 drop into both eyes 2 (two) times daily.     cephALEXin (KEFLEX) 500 MG capsule Take 1 capsule (500 mg total) by mouth 2 (two) times daily. 14 capsule 0   cholecalciferol (VITAMIN D) 1000 units tablet Take 1,000 Units by mouth daily.      ciprofloxacin (CIPRO) 500 MG tablet Take 1 tablet (500 mg total) by mouth 2 (two) times daily. 20 tablet 0   diclofenac sodium (VOLTAREN) 1 % GEL Apply 2 g topically 3 (three) times daily as needed. 2 g 0   diphenoxylate-atropine (LOMOTIL) 2.5-0.025 MG tablet Take 1 tablet by mouth every 6 (six) hours as needed for diarrhea or loose stools. 90 tablet 1   furosemide (LASIX) 40 MG tablet Take 1 tablet (40 mg total) by mouth daily. 30 tablet 1   gabapentin (NEURONTIN) 300 MG capsule Take 1 capsule (300 mg total) by mouth 3 (three) times daily. 90 capsule 1   imatinib (GLEEVEC) 100 MG tablet Take 300 mg by mouth daily with lunch. Take with noon meal and a large glass of water.Caution:Chemotherapy     isosorbide mononitrate (IMDUR) 30 MG 24 hr tablet Take 1 tablet (30 mg total) by mouth daily. 90 tablet 3   lidocaine (LMX) 4 % cream Apply topically 4 (four) times daily. Apply to affected areas as directed 30 g 0   loperamide (IMODIUM) 2 MG capsule Take 2 mg by mouth every 6 (six) hours as needed for diarrhea or loose stools.      Multiple Vitamin (MULTIVITAMIN) tablet Take 1 tablet by mouth daily.     multivitamin-lutein (OCUVITE-LUTEIN) CAPS capsule Take 1 capsule by mouth daily.     ondansetron (ZOFRAN) 4 MG tablet Take 1 tablet (4 mg total) by mouth every 8 (eight) hours as needed for nausea or vomiting. 20 tablet 1   oxyCODONE (  OXY IR/ROXICODONE) 5 MG immediate release tablet Take 1 tablet (5 mg total) by mouth every 3 (three) hours as needed for moderate pain ((score 4 to 6)). 30 tablet 0   predniSONE (DELTASONE) 1 MG  tablet Take 1 tablet (1 mg total) by mouth daily with breakfast. 30 tablet 0   predniSONE (DELTASONE) 10 MG tablet Take 3 tablets (30 mg total) by mouth daily. 15 tablet 0   pyridostigmine (MESTINON) 60 MG tablet Take 60 mg by mouth daily.     rosuvastatin (CRESTOR) 10 MG tablet Take 5 mg by mouth daily.     senna (SENOKOT) 8.6 MG TABS tablet Take 1 tablet (8.6 mg total) by mouth 2 (two) times daily. 120 tablet 0   timolol (BETIMOL) 0.5 % ophthalmic solution Place 1 drop into both eyes 2 (two) times daily.     methocarbamol (ROBAXIN) 500 MG tablet Take 1 tablet (500 mg total) by mouth every 6 (six) hours as needed for muscle spasms. 60 tablet 0   No facility-administered medications prior to visit.     Allergies  Allergen Reactions   Other Other (See Comments)    Regarding scans, the PATIENT IS CLAUSTROPHOBIC!!!   Phenergan [Promethazine Hcl] Swelling and Other (See Comments)    Cannot be combined with Zofran because swelling of the eyes resulted and patient became very restless-   Zofran [Ondansetron Hcl] Swelling and Other (See Comments)    Cannot be combined with Phenergan because swelling of the eyes resulted and patient became very restless-   Sulfamethoxazole Rash and Itching    ROS Review of Systems  Constitutional: Negative for chills, diaphoresis, fatigue, fever and unexpected weight change.  HENT: Negative.   Eyes: Negative for photophobia and visual disturbance.  Respiratory: Negative.   Cardiovascular: Negative.   Gastrointestinal: Negative.   Endocrine: Negative for polyphagia and polyuria.  Genitourinary: Negative for dysuria, frequency and urgency.  Musculoskeletal: Positive for neck stiffness. Negative for arthralgias.  Skin: Negative for pallor and rash.  Neurological: Negative for speech difficulty and headaches.  Hematological: Does not bruise/bleed easily.  Psychiatric/Behavioral: Negative.       Objective:    Physical Exam  Constitutional: He  is oriented to person, place, and time. He appears well-developed and well-nourished. No distress.  HENT:  Head: Normocephalic and atraumatic.  Right Ear: External ear normal.  Left Ear: External ear normal.  Eyes: Right eye exhibits no discharge. Left eye exhibits no discharge. No scleral icterus.  Neck: Neck supple. No JVD present. No tracheal deviation present.  Pulmonary/Chest: Effort normal. No stridor.  Neurological: He is alert and oriented to person, place, and time.  Skin: Skin is warm and dry. He is not diaphoretic.  Psychiatric: He has a normal mood and affect. His behavior is normal.    Temp (!) 97.4 F (36.3 C) (Oral) Comment: per pt. Comment (Src): per pt.   Ht 5\' 6"  (1.676 m)    BMI 24.21 kg/m  Wt Readings from Last 3 Encounters:  12/16/18 150 lb (68 kg)  12/07/18 150 lb (68 kg)  11/23/18 147 lb 11.3 oz (67 kg)     Health Maintenance Due  Topic Date Due   URINE MICROALBUMIN  07/19/2015   OPHTHALMOLOGY EXAM  11/25/2017   FOOT EXAM  04/07/2018    There are no preventive care reminders to display for this patient.  Lab Results  Component Value Date   TSH 0.51 09/27/2017   Lab Results  Component Value Date   WBC 7.0 12/16/2018  HGB 10.4 (L) 12/16/2018   HCT 31.8 (L) 12/16/2018   MCV 94.9 12/16/2018   PLT 504 (H) 12/16/2018   Lab Results  Component Value Date   NA 134 (L) 12/16/2018   K 3.5 12/16/2018   CO2 31 12/16/2018   GLUCOSE 123 (H) 12/16/2018   BUN 6 (L) 12/16/2018   CREATININE 1.59 (H) 12/16/2018   BILITOT 0.9 11/14/2018   ALKPHOS 48 11/14/2018   AST 25 11/14/2018   ALT 14 11/14/2018   PROT 5.5 (L) 11/14/2018   ALBUMIN 3.1 (L) 11/14/2018   CALCIUM 8.9 12/16/2018   ANIONGAP 12 12/16/2018   GFR 51.48 (L) 10/06/2017   Lab Results  Component Value Date   CHOL 134 07/20/2017   Lab Results  Component Value Date   HDL 33 (L) 07/20/2017   Lab Results  Component Value Date   LDLCALC 68 07/20/2017   Lab Results  Component Value  Date   TRIG 166 (H) 07/20/2017   Lab Results  Component Value Date   CHOLHDL 4.1 07/20/2017   Lab Results  Component Value Date   HGBA1C 5.8 (H) 11/07/2018      Assessment & Plan:   Problem List Items Addressed This Visit      Musculoskeletal and Integument   Neck muscle spasm - Primary   Relevant Medications   methocarbamol (ROBAXIN) 500 MG tablet     Other   Urinary frequency      Meds ordered this encounter  Medications   methocarbamol (ROBAXIN) 500 MG tablet    Sig: Take 1 tablet (500 mg total) by mouth every 6 (six) hours as needed for muscle spasms.    Dispense:  60 tablet    Refill:  0    Follow-up: Return if symptoms worsen or fail to improve.  Will complete his course of cephalexin and use the Robaxin as needed.  Virtual Visit via Video Note  I connected with Maria Gotz Knisley on 12/20/18 at 11:00 AM EST by a video enabled telemedicine application and verified that I am speaking with the correct person using two identifiers.  Location: Patient: home Provider:    I discussed the limitations of evaluation and management by telemedicine and the availability of in person appointments. The patient expressed understanding and agreed to proceed.  History of Present Illness:    Observations/Objective:   Assessment and Plan:   Follow Up Instructions:    I discussed the assessment and treatment plan with the patient. The patient was provided an opportunity to ask questions and all were answered. The patient agreed with the plan and demonstrated an understanding of the instructions.   The patient was advised to call back or seek an in-person evaluation if the symptoms worsen or if the condition fails to improve as anticipated.  I provided 21 minutes of non-face-to-face time during this encounter.   Libby Maw, MD    Libby Maw, MD

## 2018-12-21 DIAGNOSIS — I130 Hypertensive heart and chronic kidney disease with heart failure and stage 1 through stage 4 chronic kidney disease, or unspecified chronic kidney disease: Secondary | ICD-10-CM | POA: Diagnosis not present

## 2018-12-21 DIAGNOSIS — M5021 Other cervical disc displacement,  high cervical region: Secondary | ICD-10-CM | POA: Diagnosis not present

## 2018-12-21 DIAGNOSIS — M48061 Spinal stenosis, lumbar region without neurogenic claudication: Secondary | ICD-10-CM | POA: Diagnosis not present

## 2018-12-21 DIAGNOSIS — M4712 Other spondylosis with myelopathy, cervical region: Secondary | ICD-10-CM | POA: Diagnosis not present

## 2018-12-21 DIAGNOSIS — M4802 Spinal stenosis, cervical region: Secondary | ICD-10-CM | POA: Diagnosis not present

## 2018-12-21 DIAGNOSIS — M5412 Radiculopathy, cervical region: Secondary | ICD-10-CM | POA: Diagnosis not present

## 2018-12-23 DIAGNOSIS — M5021 Other cervical disc displacement,  high cervical region: Secondary | ICD-10-CM | POA: Diagnosis not present

## 2018-12-23 DIAGNOSIS — M4712 Other spondylosis with myelopathy, cervical region: Secondary | ICD-10-CM | POA: Diagnosis not present

## 2018-12-23 DIAGNOSIS — M5412 Radiculopathy, cervical region: Secondary | ICD-10-CM | POA: Diagnosis not present

## 2018-12-23 DIAGNOSIS — M48061 Spinal stenosis, lumbar region without neurogenic claudication: Secondary | ICD-10-CM | POA: Diagnosis not present

## 2018-12-23 DIAGNOSIS — I130 Hypertensive heart and chronic kidney disease with heart failure and stage 1 through stage 4 chronic kidney disease, or unspecified chronic kidney disease: Secondary | ICD-10-CM | POA: Diagnosis not present

## 2018-12-23 DIAGNOSIS — M4802 Spinal stenosis, cervical region: Secondary | ICD-10-CM | POA: Diagnosis not present

## 2018-12-27 DIAGNOSIS — M4802 Spinal stenosis, cervical region: Secondary | ICD-10-CM | POA: Diagnosis not present

## 2018-12-27 DIAGNOSIS — M48061 Spinal stenosis, lumbar region without neurogenic claudication: Secondary | ICD-10-CM | POA: Diagnosis not present

## 2018-12-27 DIAGNOSIS — M4712 Other spondylosis with myelopathy, cervical region: Secondary | ICD-10-CM | POA: Diagnosis not present

## 2018-12-27 DIAGNOSIS — I130 Hypertensive heart and chronic kidney disease with heart failure and stage 1 through stage 4 chronic kidney disease, or unspecified chronic kidney disease: Secondary | ICD-10-CM | POA: Diagnosis not present

## 2018-12-27 DIAGNOSIS — M5412 Radiculopathy, cervical region: Secondary | ICD-10-CM | POA: Diagnosis not present

## 2018-12-27 DIAGNOSIS — M5021 Other cervical disc displacement,  high cervical region: Secondary | ICD-10-CM | POA: Diagnosis not present

## 2018-12-28 DIAGNOSIS — I130 Hypertensive heart and chronic kidney disease with heart failure and stage 1 through stage 4 chronic kidney disease, or unspecified chronic kidney disease: Secondary | ICD-10-CM | POA: Diagnosis not present

## 2018-12-28 DIAGNOSIS — M4712 Other spondylosis with myelopathy, cervical region: Secondary | ICD-10-CM | POA: Diagnosis not present

## 2018-12-28 DIAGNOSIS — M48061 Spinal stenosis, lumbar region without neurogenic claudication: Secondary | ICD-10-CM | POA: Diagnosis not present

## 2018-12-28 DIAGNOSIS — M4802 Spinal stenosis, cervical region: Secondary | ICD-10-CM | POA: Diagnosis not present

## 2018-12-28 DIAGNOSIS — M5021 Other cervical disc displacement,  high cervical region: Secondary | ICD-10-CM | POA: Diagnosis not present

## 2018-12-28 DIAGNOSIS — M5412 Radiculopathy, cervical region: Secondary | ICD-10-CM | POA: Diagnosis not present

## 2018-12-29 ENCOUNTER — Telehealth: Payer: Self-pay | Admitting: *Deleted

## 2018-12-29 DIAGNOSIS — I5032 Chronic diastolic (congestive) heart failure: Secondary | ICD-10-CM | POA: Diagnosis not present

## 2018-12-29 DIAGNOSIS — Z7901 Long term (current) use of anticoagulants: Secondary | ICD-10-CM | POA: Diagnosis not present

## 2018-12-29 DIAGNOSIS — M810 Age-related osteoporosis without current pathological fracture: Secondary | ICD-10-CM | POA: Diagnosis not present

## 2018-12-29 DIAGNOSIS — K219 Gastro-esophageal reflux disease without esophagitis: Secondary | ICD-10-CM | POA: Diagnosis not present

## 2018-12-29 DIAGNOSIS — D631 Anemia in chronic kidney disease: Secondary | ICD-10-CM | POA: Diagnosis not present

## 2018-12-29 DIAGNOSIS — E1122 Type 2 diabetes mellitus with diabetic chronic kidney disease: Secondary | ICD-10-CM | POA: Diagnosis not present

## 2018-12-29 DIAGNOSIS — E1165 Type 2 diabetes mellitus with hyperglycemia: Secondary | ICD-10-CM | POA: Diagnosis not present

## 2018-12-29 DIAGNOSIS — G4700 Insomnia, unspecified: Secondary | ICD-10-CM | POA: Diagnosis not present

## 2018-12-29 DIAGNOSIS — M5021 Other cervical disc displacement,  high cervical region: Secondary | ICD-10-CM | POA: Diagnosis not present

## 2018-12-29 DIAGNOSIS — H9193 Unspecified hearing loss, bilateral: Secondary | ICD-10-CM | POA: Diagnosis not present

## 2018-12-29 DIAGNOSIS — E782 Mixed hyperlipidemia: Secondary | ICD-10-CM | POA: Diagnosis not present

## 2018-12-29 DIAGNOSIS — H409 Unspecified glaucoma: Secondary | ICD-10-CM | POA: Diagnosis not present

## 2018-12-29 DIAGNOSIS — N183 Chronic kidney disease, stage 3 unspecified: Secondary | ICD-10-CM | POA: Diagnosis not present

## 2018-12-29 DIAGNOSIS — M48061 Spinal stenosis, lumbar region without neurogenic claudication: Secondary | ICD-10-CM | POA: Diagnosis not present

## 2018-12-29 DIAGNOSIS — M4712 Other spondylosis with myelopathy, cervical region: Secondary | ICD-10-CM | POA: Diagnosis not present

## 2018-12-29 DIAGNOSIS — E1151 Type 2 diabetes mellitus with diabetic peripheral angiopathy without gangrene: Secondary | ICD-10-CM | POA: Diagnosis not present

## 2018-12-29 DIAGNOSIS — M5412 Radiculopathy, cervical region: Secondary | ICD-10-CM | POA: Diagnosis not present

## 2018-12-29 DIAGNOSIS — I130 Hypertensive heart and chronic kidney disease with heart failure and stage 1 through stage 4 chronic kidney disease, or unspecified chronic kidney disease: Secondary | ICD-10-CM | POA: Diagnosis not present

## 2018-12-29 DIAGNOSIS — M7032 Other bursitis of elbow, left elbow: Secondary | ICD-10-CM | POA: Diagnosis not present

## 2018-12-29 DIAGNOSIS — I2510 Atherosclerotic heart disease of native coronary artery without angina pectoris: Secondary | ICD-10-CM | POA: Diagnosis not present

## 2018-12-29 DIAGNOSIS — I6529 Occlusion and stenosis of unspecified carotid artery: Secondary | ICD-10-CM | POA: Diagnosis not present

## 2018-12-29 DIAGNOSIS — K5900 Constipation, unspecified: Secondary | ICD-10-CM | POA: Diagnosis not present

## 2018-12-29 DIAGNOSIS — M4802 Spinal stenosis, cervical region: Secondary | ICD-10-CM | POA: Diagnosis not present

## 2018-12-29 DIAGNOSIS — G7000 Myasthenia gravis without (acute) exacerbation: Secondary | ICD-10-CM | POA: Diagnosis not present

## 2018-12-29 DIAGNOSIS — C9291 Myeloid leukemia, unspecified in remission: Secondary | ICD-10-CM | POA: Diagnosis not present

## 2018-12-29 NOTE — Telephone Encounter (Signed)
Izora Gala, OT, [facility] left a message asking for verbal orders for a home health aide 1week4 to help with bathing and home care.  Medical record reviewed. Social work note reviewed.  Verbal orders given per office protocol.

## 2019-01-12 ENCOUNTER — Other Ambulatory Visit: Payer: Self-pay | Admitting: Neurological Surgery

## 2019-01-12 DIAGNOSIS — R1313 Dysphagia, pharyngeal phase: Secondary | ICD-10-CM

## 2019-01-13 ENCOUNTER — Ambulatory Visit
Admission: RE | Admit: 2019-01-13 | Discharge: 2019-01-13 | Disposition: A | Discharge status: Home / Self Care | Payer: Medicare Other | Source: Ambulatory Visit | Attending: Neurological Surgery | Admitting: Neurological Surgery

## 2019-01-13 ENCOUNTER — Telehealth: Payer: Self-pay | Admitting: Gastroenterology

## 2019-01-13 ENCOUNTER — Other Ambulatory Visit: Payer: Medicare Other

## 2019-01-13 ENCOUNTER — Other Ambulatory Visit: Payer: Self-pay

## 2019-01-13 DIAGNOSIS — R1313 Dysphagia, pharyngeal phase: Secondary | ICD-10-CM

## 2019-01-13 DIAGNOSIS — K115 Sialolithiasis: Secondary | ICD-10-CM | POA: Diagnosis not present

## 2019-01-17 ENCOUNTER — Encounter: Payer: Self-pay | Admitting: Nurse Practitioner

## 2019-01-17 ENCOUNTER — Telehealth: Payer: Self-pay | Admitting: General Surgery

## 2019-01-17 ENCOUNTER — Other Ambulatory Visit (HOSPITAL_COMMUNITY): Payer: Self-pay | Admitting: *Deleted

## 2019-01-17 ENCOUNTER — Ambulatory Visit (INDEPENDENT_AMBULATORY_CARE_PROVIDER_SITE_OTHER): Payer: Medicare Other | Admitting: Nurse Practitioner

## 2019-01-17 VITALS — BP 102/58 | HR 69 | Temp 98.0°F | Ht 66.0 in | Wt 149.0 lb

## 2019-01-17 DIAGNOSIS — R633 Feeding difficulties, unspecified: Secondary | ICD-10-CM

## 2019-01-17 DIAGNOSIS — Z1159 Encounter for screening for other viral diseases: Secondary | ICD-10-CM | POA: Diagnosis not present

## 2019-01-17 DIAGNOSIS — K317 Polyp of stomach and duodenum: Secondary | ICD-10-CM | POA: Diagnosis not present

## 2019-01-17 DIAGNOSIS — R1310 Dysphagia, unspecified: Secondary | ICD-10-CM | POA: Diagnosis not present

## 2019-01-17 MED ORDER — OMEPRAZOLE 20 MG PO CPDR: 20.0000 mg | DELAYED_RELEASE_CAPSULE | Freq: Every day | ORAL | 1 refills | Status: AC

## 2019-01-17 NOTE — Progress Notes (Signed)
01/17/2019 [name]:[id number] 1940/07/02   History of Present Illness:  [name]. [name] is an 78 year old male with a past medical history of hypertension, coronary artery disease, CVA x 3 most recent occurred in 2018, diabetes mellitus type 2, chronic kidney disease, DVT and pulmonary embolism in 2013, kidney stones, pituitary tumor, GERD and colon polyps. 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 S/P anterior cervical discectomy/fusion surgery 11/09/2018 by Dr. Ronnald Ramp.  He presents today with complaints of throat swelling with difficulty swallowing which started the third week of November 2020.  He feels significant amount of drainage in his throat.  Food gets stuck in his throat each time he eats, he drinks water which helps to push the food down.  He coughs out a white mucus.  No hemoptysis.  No vomiting.  No weight loss.  He is mostly concerned about the throat swelling sensation which prevents him from eating.  He contacted Dr. Ronnald Ramp with concerned that his difficulty swallowing was related to his cervical spine surgery.  The patient stated Dr. Ronnald Ramp assessed his dysphagia is unlikely related to his cervical spine surgery.  He underwent a CT of the neck 01/13/2019 as ordered by Dr. Ronnald Ramp. The neck CT results was negative for any acute findings in the neck, no explanation for acute difficulty swallowing.  Interval postoperative changes to the cervical spine with no adverse features.  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  Right maxillary chronic sinusitis was noted.  Punctate left submandibular gland sialolithiasis was noted as well.  He underwent an EGD 04/23/2015 by Dr. Havery Moros which identified an 54mm duodenal bulb adenomatous polyp which was resected and retrieved.  He was advised to repeat an EGD in 6 months which was not done.  He is passing a normal formed brown bowel movement most days.  He has infrequent loose  stool.  No rectal bleeding or melena.  His most recent colonoscopy was November 2015 which identified diverticulosis, no polyps.  No plans for further colon cancer screening.  Labs 12/16/2018: Sodium 134.  Potassium 3.5.  Glucose 123.  BUN 6.  Creatinine 1.59.  WBC 7.0.  Hemoglobin 10.4.  Hematocrit 31.8.  Platelet 504.  CT soft tissue neck 01/13/2019: 1. No acute findings in the neck. No explanation for acute difficulty swallowing. 2. Interval postoperative changes to the cervical spine with no adverse features. 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 3. Punctate left submandibular gland sialolithiasis. 4. Chronic right maxillary sinusitis. 5. Carotid, vertebral, and Aortic atherosclerosis    EGD 04/23/2015 by Dr. Havery Moros: - Esophagogastric landmarks identified. - Normal esophagus. - Erythematous mucosa in the stomach. Biopsied. - A few gastric polyps. Resected and retrieved. - A single duodenal polyp. Resected and retrieved. Clip was placed. - Normal second portion of the duodenum.  Colonoscopy 12/26/2013: 1. There was moderate diverticulosis noted in the sigmoid colon 2. The examination was otherwise normal Given your age, you will not need another colonoscopy for colon cancer screening or polyp surveillance   Past Medical History:  Diagnosis Date   Bruises easily    d/t being on Eliquis   Carotid artery disease ([place])    a. Duplex 05/2014: 123456 RICA, 123456 LICA, elevated velocities in right subclavian artery, normal left subclavian artery..   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 cath in 2003 showed 10-20% LM, scattered 20% prox-mid LAD, 30% more  distal LAD, 50-60% stenosis of LAD towards apex, 20% Cx, 30% dRCA, focal 95% stenosis in smaller of 2 branches of PDA, EF 60-65%.   Diabetes mellitus ([place])    no longer- cancxelled by Dr Hale Bogus   Diarrhea    takes Imodium daily as needed   Diverticulosis    Diverticulosis of colon (without mention of hemorrhage)    DJD (degenerative joint disease)    DVT (deep venous thrombosis) ([place]) 07/2011   Esophageal stricture    Essential hypertension    takes Imdur and Lisinopril daily   Gastric polyps    benign   GERD (gastroesophageal reflux disease)    takes Nexium daily   Glaucoma    uses eye drops   History of blood transfusion `   leg amputation   History of bronchitis    not sure when the last time   History of colon polyps    benign   History of kidney stones    litrotrispspy   History of kidney stones    passed   Insomnia    takes Melatonin nightly as needed   Mixed hyperlipidemia    takes Crestor daily   Myasthenia gravis ([place])    uses prednisone   Osteoporosis 2016   Peripheral edema    takes Lasix daily   Peripheral vascular disease ([place])    Pituitary tumor    [facility]Walden [facility] checks it every Jan    Pneumonia 01/2016   PONV (postoperative nausea and vomiting)    Ptosis    Pulmonary embolus ([place]) 2012   Stroke ([place]) 06/2015   x 3- last one 2018- balance issuses   Past Surgical History:  Procedure Laterality Date   ABDOMINAL AORTOGRAM W/LOWER EXTREMITY N/A 11/18/2016   Procedure: ABDOMINAL AORTOGRAM W/LOWER EXTREMITY;  Surgeon: [name], MD;  Location: [facility];  Service: Cardiovascular;  Laterality: N/A;   AMPUTATION Right 02/07/2018   Procedure: right leg AMPUTATION ABOVE KNEE;  Surgeon: [name], MD;  Location: [place] [facility];  Service: Vascular;  Laterality: Right;   ANTERIOR CERVICAL DECOMP/DISCECTOMY FUSION N/A 11/09/2018   Procedure: CERVICAL THREE-FOUR ANTERIOR CERVICAL  DECOMPRESSION/DISCECTOMY FUSION WITH CERVICAL FIVE CORPECTOMY;  Surgeon: [name], MD;  Location: [facility];  Service: Neurosurgery;  Laterality: N/A;   AORTOGRAM Right 12/23/2017   Procedure: AORTOGRAM, RIGHT LOWER EXTREMITY ANGIOGRAM, RIGHT SUPERFICIAL FEMORAL ARTERY ANGIOPLASTY WITH STENT, RIGHT POPLITEAL ARTERY ANGIOPLASTY WITH STENT, LEFT GROIN CUTDOWN AND LEFT FEMORAL ARTERY REPAIR;  Surgeon: [name], MD;  Location: [facility];  Service: Vascular;  Laterality: Right;   BACK SURGERY     x2, lumbar and cervical   CARDIAC CATHETERIZATION     CARPAL TUNNEL RELEASE Left 09/23/2018   Procedure: CARPAL TUNNEL RELEASE;  Surgeon: [name], MD;  Location: [facility];  Service: Neurosurgery;  Laterality: Left;  CARPAL TUNNEL RELEASE   CATARACT EXTRACTION Bilateral 12/12   COLONOSCOPY     ESOPHAGOGASTRODUODENOSCOPY ENDOSCOPY  04/2015   IR GENERIC HISTORICAL  03/03/2016   IR IVC FILTER PLMT / S&I [name] GUID/MOD SED 03/03/2016 [name], MD [facility]   IR GENERIC HISTORICAL  04/09/2016  IR RADIOLOGIST EVAL & MGMT 04/09/2016 [name], MD [facility]   IR GENERIC HISTORICAL  04/30/2016   IR IVC FILTER RETRIEVAL / S&I [name] GUID/MOD SED 04/30/2016 [name], MD [facility]   LITHOTRIPSY     LUMBAR LAMINECTOMY/DECOMPRESSION MICRODISCECTOMY Right 09/14/2012   Procedure: Right Lumbar three-four, four-five, Lumbar five-Sacral one decompressive laminectomy;  Surgeon: [name], MD;  Location: [facility];  Service: Neurosurgery;  Laterality: Right;   LUMBAR LAMINECTOMY/DECOMPRESSION MICRODISCECTOMY Left 03/11/2016   Procedure: Left Lumbar Two-ThreeMicrodiscectomy;  Surgeon: [name], MD;  Location: [facility];  Service: Neurosurgery;  Laterality: Left;   PERIPHERAL VASCULAR INTERVENTION  11/18/2016   Procedure: PERIPHERAL VASCULAR INTERVENTION;  Surgeon: [name], MD;  Location: [facility];  Service: Cardiovascular;;  Left popliteal   SHOULDER  SURGERY Left 05/07/2009   TONSILLECTOMY     ULNAR NERVE TRANSPOSITION Left 09/23/2018   Procedure: ULNAR NERVE DECOMPRESSION/TRANSPOSITION;  Surgeon: [name], MD;  Location: [facility];  Service: Neurosurgery;  Laterality: Left;  ULNAR NERVE DECOMPRESSION/TRANSPOSITION   Current Outpatient Medications on File Prior to Visit  Medication Sig Dispense Refill   acetaminophen (TYLENOL) 325 MG tablet Take 2 tablets (650 mg total) by mouth every 4 (four) hours as needed for mild pain ((score 1 to 3) or temp > 100.5).     apixaban (ELIQUIS) 2.5 MG TABS tablet Take 1 tablet (2.5 mg total) by mouth 2 (two) times daily. (Patient taking differently: Take 2.5 mg by mouth daily. ) 60 tablet 1   brimonidine (ALPHAGAN) 0.2 % ophthalmic solution Place 1 drop into both eyes 2 (two) times daily.     cholecalciferol (VITAMIN D) 1000 units tablet Take 1,000 Units by mouth daily.      diclofenac sodium (VOLTAREN) 1 % GEL Apply 2 g topically 3 (three) times daily as needed. 2 g 0   diphenoxylate-atropine (LOMOTIL) 2.5-0.025 MG tablet Take 1 tablet by mouth every 6 (six) hours as needed for diarrhea or loose stools. 90 tablet 1   furosemide (LASIX) 40 MG tablet Take 1 tablet (40 mg total) by mouth daily. 30 tablet 1   gabapentin (NEURONTIN) 300 MG capsule Take 1 capsule (300 mg total) by mouth 3 (three) times daily. 90 capsule 1   imatinib (GLEEVEC) 100 MG tablet Take 300 mg by mouth daily with lunch. Take with noon meal and a large glass of water.Caution:Chemotherapy     isosorbide mononitrate (IMDUR) 30 MG 24 hr tablet Take 1 tablet (30 mg total) by mouth daily. 90 tablet 3   lidocaine (LMX) 4 % cream Apply topically 4 (four) times daily. Apply to affected areas as directed 30 g 0   methocarbamol (ROBAXIN) 500 MG tablet Take 1 tablet (500 mg total) by mouth every 6 (six) hours as needed for muscle spasms. 60 tablet 0   Multiple Vitamin (MULTIVITAMIN) tablet Take 1 tablet by mouth daily.      multivitamin-lutein (OCUVITE-LUTEIN) CAPS capsule Take 1 capsule by mouth daily.     ondansetron (ZOFRAN) 4 MG tablet Take 1 tablet (4 mg total) by mouth every 8 (eight) hours as needed for nausea or vomiting. 20 tablet 1   oxyCODONE (OXY IR/ROXICODONE) 5 MG immediate release tablet Take 1 tablet (5 mg total) by mouth every 3 (three) hours as needed for moderate pain ((score 4 to 6)). 30 tablet 0   pyridostigmine (MESTINON) 60 MG tablet Take 60 mg by mouth daily.     rosuvastatin (CRESTOR) 10 MG tablet  Take 5 mg by mouth daily.     senna (SENOKOT) 8.6 MG TABS tablet Take 1 tablet (8.6 mg total) by mouth 2 (two) times daily. 120 tablet 0   timolol (BETIMOL) 0.5 % ophthalmic solution Place 1 drop into both eyes 2 (two) times daily.     No current facility-administered medications on file prior to visit.    Allergies  Allergen Reactions   Other Other (See Comments)    Regarding scans, the PATIENT IS CLAUSTROPHOBIC!!!   Phenergan [Promethazine Hcl] Swelling and Other (See Comments)    Cannot be combined with Zofran because swelling of the eyes resulted and patient became very restless-   Zofran [Ondansetron Hcl] Swelling and Other (See Comments)    Cannot be combined with Phenergan because swelling of the eyes resulted and patient became very restless-   Sulfamethoxazole Rash and Itching      Current Medications, Allergies, Past Medical History, Past Surgical History, Family History and Social History were reviewed in [employer] record.   Physical Exam: BP (!) 102/58    Pulse 69    Temp 98 F (36.7 C)    Ht 5\' 6"  (1.676 m)    Wt 149 lb (67.6 kg)    SpO2 100%    BMI 24.05 kg/m  General: 78 year old male in no acute distress presents sitting in a wheelchair Head: Normocephalic and atraumatic Eyes:  Sclerae anicteric, conjunctiva pink  Ears: Normal auditory acuity Mouth: No ulcers or lesions, posterior pharynx pink without erythema or exudate Neck:  Supple, no lymphadenopathy or thyromegaly Lungs: Clear throughout to auscultation Heart: Regular rate and rhythm Abdomen: Soft, non tender and non distended. No masses, no hepatomegaly. Normal bowel sounds x 4 quadrants Rectal: Deferred Musculoskeletal: Symmetrical with no gross deformities  Extremities: Right below the knee amputation, left lower extremity without edema Neurological: Alert oriented x 4, grossly nonfocal Psychological:  Alert and cooperative. Normal mood and affect  Assessment and Recommendations:  76.  79 year old male status post anterior cervical vasectomy/fusion surgery 11/09/2018 presents with oral phase dysphagia.  No clinical signs of angioedema. -EGD benefits and risk discussed including risk with sedation, bleeding and perforation -Our office will contact the patient's cardiologist Dr. Burt Knack to verify if okay to hold Eliquis for 3 days prior to his EGD -Omeprazole 20 mg 1 capsule by mouth once daily -Take 3 separate sips of water before swallowing any pills or food, cut food in small pieces, chew food thoroughly -I advised ENT consult prior to proceeding with EGD  2.  Postnasal drainage with evidence of chronic sinusitis and left submandibular gland sialolith lithiasis per neck CT 01/13/2019 -ENT consult ASAP  3.  History of a duodenal adenoma  -Past due for surveillance EGD -See plan in #1  4.  History of CML  5.  History of CAD, CABG x3, DVT/PE on Eliquis  6.  Chronic kidney disease

## 2019-01-17 NOTE — Telephone Encounter (Signed)
Pt takes Eliquis for recurrent DVT/PE, also has hx of stroke. SCr 1.59, CrCl 3mL/min. Pt on lower dose of Eliquis 2.5mg  BID for prevention of recurrent VTE. Typically would recommend only holding Eliquis for 1 day prior to lower risk procedure like endoscopy - would be ok to hold up to 2 days if needed based on renal dysfunction (pt has previously been cleared by Dr Burt Knack to hold Eliquis > 1 day for prior procedure in September).

## 2019-01-17 NOTE — Telephone Encounter (Signed)
[facility] Pre-operative Risk Assessment     Request for surgical clearance:     Endoscopy Procedure  What type of surgery is being performed?     endoscopy  When is this surgery scheduled?     02/08/2019  What type of clearance is required ?   Pharmacy  Are there any medications that need to be held prior to surgery and how long? Eliquis for 3 days  Practice name and name of physician performing surgery?      [facility]  What is your office phone and fax number?      Phone- [phone]  [phone]  Anesthesia type (None, local, MAC, general) ?       MAC

## 2019-01-17 NOTE — Progress Notes (Signed)
Agree with assessment and plan as outlined. Can proceed with EGD to be done in the [facility], off Eliquis for 3 days. Thanks

## 2019-01-17 NOTE — Telephone Encounter (Signed)
   Primary Cardiologist: Sherren Mocha, MD  Chart reviewed as part of pre-operative protocol coverage. Given past medical history and time since last visit, based on ACC/AHA guidelines, [name] would be at acceptable risk for the planned procedure without further cardiovascular testing.   He has an RCRI class III risk, 6.6% risk of major cardiac event.  Pt takes Eliquis for recurrent DVT/PE, also has hx of stroke. SCr 1.59, CrCl 7mL/min. Pt on lower dose of Eliquis 2.5mg  BID for prevention of recurrent VTE. Typically would recommend only holding Eliquis for 1 day prior to lower risk procedure like endoscopy - would be ok to hold up to 2 days if needed based on renal dysfunction (pt has previously been cleared by Dr Burt Knack to hold Eliquis > 1 day for prior procedure in September).  I will route this recommendation to the requesting party via Epic fax function and remove from pre-op pool.  Please call with questions.    [name]. [facility] [place] [address] Office [phone] Fax [phone]

## 2019-01-17 NOTE — Patient Instructions (Addendum)
If you are age 80 or older, your body mass index should be between 23-30. Your Body mass index is 24.05 kg/m. If this is out of the aforementioned range listed, please consider follow up with your Primary Care Provider.  If you are age 72 or younger, your body mass index should be between 19-25. Your Body mass index is 24.05 kg/m. If this is out of the aformentioned range listed, please consider follow up with your Primary Care Provider.   We have sent the following medications to your pharmacy for you to pick up at your convenience:  Omeprazole 20 mg   You have been scheduled for an endoscopy. Please follow written instructions given to you at your visit today. If you use inhalers (even only as needed), please bring them with you on the day of your procedure.  You will be contaced by our office prior to your procedure for directions on holding your Eliquis.  If you do not hear from our office 1 week prior to your scheduled procedure, please call [phone] to discuss.  Call our office if your symptoms worsen.

## 2019-01-18 NOTE — Telephone Encounter (Signed)
Called and spoke to patient.  He expressed understanding to hold Eliquis for 2 days prior to procedure on 02-08-19.  Patient asked about ENT referral. Called Dr. Pollie Friar office to follow on referral.  They allowed me to schedule an appointment for this Friday, December 11th at 3:45pm.  They will call patient to inform him of the appt details.

## 2019-01-19 ENCOUNTER — Emergency Department (HOSPITAL_COMMUNITY): Payer: Medicare Other

## 2019-01-19 ENCOUNTER — Other Ambulatory Visit: Payer: Self-pay

## 2019-01-19 ENCOUNTER — Inpatient Hospital Stay (HOSPITAL_COMMUNITY)
Admission: EM | Admit: 2019-01-19 | Discharge: 2019-02-10 | DRG: 871 | Disposition: E | Payer: Medicare Other | Attending: Internal Medicine | Admitting: Internal Medicine

## 2019-01-19 ENCOUNTER — Encounter (HOSPITAL_COMMUNITY): Payer: Self-pay | Admitting: Emergency Medicine

## 2019-01-19 DIAGNOSIS — A419 Sepsis, unspecified organism: Secondary | ICD-10-CM | POA: Diagnosis present

## 2019-01-19 DIAGNOSIS — J690 Pneumonitis due to inhalation of food and vomit: Secondary | ICD-10-CM | POA: Diagnosis present

## 2019-01-19 DIAGNOSIS — Z888 Allergy status to other drugs, medicaments and biological substances status: Secondary | ICD-10-CM

## 2019-01-19 DIAGNOSIS — R0902 Hypoxemia: Secondary | ICD-10-CM | POA: Diagnosis not present

## 2019-01-19 DIAGNOSIS — Z981 Arthrodesis status: Secondary | ICD-10-CM

## 2019-01-19 DIAGNOSIS — K2990 Gastroduodenitis, unspecified, without bleeding: Secondary | ICD-10-CM | POA: Diagnosis present

## 2019-01-19 DIAGNOSIS — Z66 Do not resuscitate: Secondary | ICD-10-CM | POA: Diagnosis present

## 2019-01-19 DIAGNOSIS — R490 Dysphonia: Secondary | ICD-10-CM | POA: Diagnosis present

## 2019-01-19 DIAGNOSIS — K2970 Gastritis, unspecified, without bleeding: Secondary | ICD-10-CM

## 2019-01-19 DIAGNOSIS — F40240 Claustrophobia: Secondary | ICD-10-CM | POA: Diagnosis present

## 2019-01-19 DIAGNOSIS — Z8673 Personal history of transient ischemic attack (TIA), and cerebral infarction without residual deficits: Secondary | ICD-10-CM

## 2019-01-19 DIAGNOSIS — I2510 Atherosclerotic heart disease of native coronary artery without angina pectoris: Secondary | ICD-10-CM | POA: Diagnosis present

## 2019-01-19 DIAGNOSIS — Z87442 Personal history of urinary calculi: Secondary | ICD-10-CM

## 2019-01-19 DIAGNOSIS — Z89611 Acquired absence of right leg above knee: Secondary | ICD-10-CM

## 2019-01-19 DIAGNOSIS — E782 Mixed hyperlipidemia: Secondary | ICD-10-CM | POA: Diagnosis present

## 2019-01-19 DIAGNOSIS — C9211 Chronic myeloid leukemia, BCR/ABL-positive, in remission: Secondary | ICD-10-CM | POA: Diagnosis present

## 2019-01-19 DIAGNOSIS — R112 Nausea with vomiting, unspecified: Secondary | ICD-10-CM

## 2019-01-19 DIAGNOSIS — B961 Klebsiella pneumoniae [K. pneumoniae] as the cause of diseases classified elsewhere: Secondary | ICD-10-CM | POA: Diagnosis present

## 2019-01-19 DIAGNOSIS — J189 Pneumonia, unspecified organism: Secondary | ICD-10-CM | POA: Diagnosis present

## 2019-01-19 DIAGNOSIS — E876 Hypokalemia: Secondary | ICD-10-CM | POA: Diagnosis present

## 2019-01-19 DIAGNOSIS — I5032 Chronic diastolic (congestive) heart failure: Secondary | ICD-10-CM | POA: Diagnosis present

## 2019-01-19 DIAGNOSIS — I130 Hypertensive heart and chronic kidney disease with heart failure and stage 1 through stage 4 chronic kidney disease, or unspecified chronic kidney disease: Secondary | ICD-10-CM | POA: Diagnosis present

## 2019-01-19 DIAGNOSIS — K317 Polyp of stomach and duodenum: Secondary | ICD-10-CM | POA: Diagnosis present

## 2019-01-19 DIAGNOSIS — Z4659 Encounter for fitting and adjustment of other gastrointestinal appliance and device: Secondary | ICD-10-CM

## 2019-01-19 DIAGNOSIS — Z9114 Patient's other noncompliance with medication regimen: Secondary | ICD-10-CM

## 2019-01-19 DIAGNOSIS — K222 Esophageal obstruction: Secondary | ICD-10-CM

## 2019-01-19 DIAGNOSIS — Z1611 Resistance to penicillins: Secondary | ICD-10-CM | POA: Diagnosis present

## 2019-01-19 DIAGNOSIS — N179 Acute kidney failure, unspecified: Secondary | ICD-10-CM | POA: Diagnosis not present

## 2019-01-19 DIAGNOSIS — E1159 Type 2 diabetes mellitus with other circulatory complications: Secondary | ICD-10-CM | POA: Diagnosis not present

## 2019-01-19 DIAGNOSIS — I469 Cardiac arrest, cause unspecified: Secondary | ICD-10-CM | POA: Diagnosis not present

## 2019-01-19 DIAGNOSIS — K5730 Diverticulosis of large intestine without perforation or abscess without bleeding: Secondary | ICD-10-CM | POA: Diagnosis present

## 2019-01-19 DIAGNOSIS — I10 Essential (primary) hypertension: Secondary | ICD-10-CM | POA: Diagnosis present

## 2019-01-19 DIAGNOSIS — E1122 Type 2 diabetes mellitus with diabetic chronic kidney disease: Secondary | ICD-10-CM | POA: Diagnosis present

## 2019-01-19 DIAGNOSIS — D638 Anemia in other chronic diseases classified elsewhere: Secondary | ICD-10-CM | POA: Diagnosis present

## 2019-01-19 DIAGNOSIS — N183 Chronic kidney disease, stage 3 unspecified: Secondary | ICD-10-CM | POA: Diagnosis not present

## 2019-01-19 DIAGNOSIS — Z86718 Personal history of other venous thrombosis and embolism: Secondary | ICD-10-CM

## 2019-01-19 DIAGNOSIS — Z20828 Contact with and (suspected) exposure to other viral communicable diseases: Secondary | ICD-10-CM | POA: Diagnosis present

## 2019-01-19 DIAGNOSIS — E1151 Type 2 diabetes mellitus with diabetic peripheral angiopathy without gangrene: Secondary | ICD-10-CM | POA: Diagnosis present

## 2019-01-19 DIAGNOSIS — E860 Dehydration: Secondary | ICD-10-CM | POA: Diagnosis present

## 2019-01-19 DIAGNOSIS — R1319 Other dysphagia: Secondary | ICD-10-CM | POA: Diagnosis present

## 2019-01-19 DIAGNOSIS — R1310 Dysphagia, unspecified: Secondary | ICD-10-CM | POA: Diagnosis not present

## 2019-01-19 DIAGNOSIS — N390 Urinary tract infection, site not specified: Secondary | ICD-10-CM | POA: Diagnosis present

## 2019-01-19 DIAGNOSIS — G7000 Myasthenia gravis without (acute) exacerbation: Secondary | ICD-10-CM | POA: Diagnosis present

## 2019-01-19 DIAGNOSIS — Z993 Dependence on wheelchair: Secondary | ICD-10-CM

## 2019-01-19 DIAGNOSIS — Z86711 Personal history of pulmonary embolism: Secondary | ICD-10-CM | POA: Diagnosis not present

## 2019-01-19 DIAGNOSIS — R140 Abdominal distension (gaseous): Secondary | ICD-10-CM

## 2019-01-19 DIAGNOSIS — M810 Age-related osteoporosis without current pathological fracture: Secondary | ICD-10-CM | POA: Diagnosis present

## 2019-01-19 DIAGNOSIS — Z8719 Personal history of other diseases of the digestive system: Secondary | ICD-10-CM

## 2019-01-19 DIAGNOSIS — Z7901 Long term (current) use of anticoagulants: Secondary | ICD-10-CM

## 2019-01-19 DIAGNOSIS — E119 Type 2 diabetes mellitus without complications: Secondary | ICD-10-CM

## 2019-01-19 DIAGNOSIS — R6520 Severe sepsis without septic shock: Secondary | ICD-10-CM | POA: Diagnosis present

## 2019-01-19 DIAGNOSIS — K228 Other specified diseases of esophagus: Secondary | ICD-10-CM | POA: Diagnosis present

## 2019-01-19 LAB — COMPREHENSIVE METABOLIC PANEL
ALT: 18 U/L (ref 0–44)
AST: 45 U/L — ABNORMAL HIGH (ref 15–41)
Albumin: 3.2 g/dL — ABNORMAL LOW (ref 3.5–5.0)
Alkaline Phosphatase: 67 U/L (ref 38–126)
Anion gap: 10 (ref 5–15)
BUN: 9 mg/dL (ref 8–23)
CO2: 28 mmol/L (ref 22–32)
Calcium: 8.5 mg/dL — ABNORMAL LOW (ref 8.9–10.3)
Chloride: 100 mmol/L (ref 98–111)
Creatinine, Ser: 1.84 mg/dL — ABNORMAL HIGH (ref 0.61–1.24)
GFR calc Af Amer: 39 mL/min — ABNORMAL LOW (ref >60)
GFR calc non Af Amer: 34 mL/min — ABNORMAL LOW (ref >60)
Glucose, Bld: 98 mg/dL (ref 70–99)
Potassium: 3 mmol/L — ABNORMAL LOW (ref 3.5–5.1)
Sodium: 138 mmol/L (ref 135–145)
Total Bilirubin: 1.3 mg/dL — ABNORMAL HIGH (ref 0.3–1.2)
Total Protein: 5.2 g/dL — ABNORMAL LOW (ref 6.5–8.1)

## 2019-01-19 LAB — CBC WITH DIFFERENTIAL/PLATELET
Abs Immature Granulocytes: 0.05 10*3/uL (ref 0.00–0.07)
Basophils Absolute: 0 10*3/uL (ref 0.0–0.1)
Basophils Relative: 0 %
Eosinophils Absolute: 0.1 10*3/uL (ref 0.0–0.5)
Eosinophils Relative: 1 %
HCT: 31 % — ABNORMAL LOW (ref 39.0–52.0)
Hemoglobin: 10.2 g/dL — ABNORMAL LOW (ref 13.0–17.0)
Immature Granulocytes: 1 %
Lymphocytes Relative: 14 %
Lymphs Abs: 1.5 10*3/uL (ref 0.7–4.0)
MCH: 31.5 pg (ref 26.0–34.0)
MCHC: 32.9 g/dL (ref 30.0–36.0)
MCV: 95.7 fL (ref 80.0–100.0)
Monocytes Absolute: 1.7 10*3/uL — ABNORMAL HIGH (ref 0.1–1.0)
Monocytes Relative: 16 %
Neutro Abs: 7.5 10*3/uL (ref 1.7–7.7)
Neutrophils Relative %: 68 %
Platelets: 234 10*3/uL (ref 150–400)
RBC: 3.24 MIL/uL — ABNORMAL LOW (ref 4.22–5.81)
RDW: 13.8 % (ref 11.5–15.5)
WBC: 10.9 10*3/uL — ABNORMAL HIGH (ref 4.0–10.5)
nRBC: 0 % (ref 0.0–0.2)

## 2019-01-19 LAB — APTT
aPTT: 30 seconds (ref 24–36)
aPTT: 38 seconds — ABNORMAL HIGH (ref 24–36)

## 2019-01-19 LAB — TRIGLYCERIDES: Triglycerides: 106 mg/dL (ref <150)

## 2019-01-19 LAB — GLUCOSE, CAPILLARY: Glucose-Capillary: 89 mg/dL (ref 70–99)

## 2019-01-19 LAB — PROCALCITONIN: Procalcitonin: 2.9 ng/mL

## 2019-01-19 LAB — LACTATE DEHYDROGENASE: LDH: 221 U/L — ABNORMAL HIGH (ref 98–192)

## 2019-01-19 LAB — POC SARS CORONAVIRUS 2 AG -  ED: SARS Coronavirus 2 Ag: NEGATIVE

## 2019-01-19 LAB — D-DIMER, QUANTITATIVE: D-Dimer, Quant: 2.69 ug/mL-FEU — ABNORMAL HIGH (ref 0.00–0.50)

## 2019-01-19 LAB — LACTIC ACID, PLASMA: Lactic Acid, Venous: 1.8 mmol/L (ref 0.5–1.9)

## 2019-01-19 LAB — LIPASE, BLOOD: Lipase: 26 U/L (ref 11–51)

## 2019-01-19 LAB — RESPIRATORY PANEL BY RT PCR (FLU A&B, COVID)
Influenza A by PCR: NEGATIVE
Influenza B by PCR: NEGATIVE
SARS Coronavirus 2 by RT PCR: NEGATIVE

## 2019-01-19 LAB — FERRITIN: Ferritin: 255 ng/mL (ref 24–336)

## 2019-01-19 LAB — HEPARIN LEVEL (UNFRACTIONATED): Heparin Unfractionated: 0.72 IU/mL — ABNORMAL HIGH (ref 0.30–0.70)

## 2019-01-19 LAB — C-REACTIVE PROTEIN: CRP: 1.2 mg/dL — ABNORMAL HIGH (ref <1.0)

## 2019-01-19 LAB — PROTIME-INR
INR: 1.2 (ref 0.8–1.2)
Prothrombin Time: 15.2 seconds (ref 11.4–15.2)

## 2019-01-19 LAB — FIBRINOGEN: Fibrinogen: 358 mg/dL (ref 210–475)

## 2019-01-19 MED ORDER — ISOSORBIDE MONONITRATE ER 30 MG PO TB24
30.0000 mg | ORAL_TABLET | Freq: Every day | ORAL | Status: DC
  Administered 2019-01-20 – 2019-01-23 (×4): 30 mg via ORAL
  Filled 2019-01-19 (×5): qty 1

## 2019-01-19 MED ORDER — POTASSIUM CHLORIDE 10 MEQ/100ML IV SOLN
10.0000 meq | Freq: Once | INTRAVENOUS | Status: AC
  Administered 2019-01-19: 10 meq via INTRAVENOUS
  Filled 2019-01-19: qty 100

## 2019-01-19 MED ORDER — TIMOLOL MALEATE 0.5 % OP SOLN
1.0000 [drp] | Freq: Two times a day (BID) | OPHTHALMIC | Status: DC
  Administered 2019-01-19 – 2019-01-25 (×12): 1 [drp] via OPHTHALMIC
  Filled 2019-01-19: qty 5

## 2019-01-19 MED ORDER — SODIUM CHLORIDE 0.9 % IV BOLUS (SEPSIS)
1000.0000 mL | Freq: Once | INTRAVENOUS | Status: AC
  Administered 2019-01-19: 1000 mL via INTRAVENOUS

## 2019-01-19 MED ORDER — HYDROMORPHONE HCL 1 MG/ML IJ SOLN
0.5000 mg | INTRAMUSCULAR | Status: DC | PRN
  Administered 2019-01-19 – 2019-01-24 (×11): 0.5 mg via INTRAVENOUS
  Filled 2019-01-19 (×11): qty 1

## 2019-01-19 MED ORDER — HEPARIN (PORCINE) 25000 UT/250ML-% IV SOLN
850.0000 [IU]/h | INTRAVENOUS | Status: DC
  Administered 2019-01-20: 1000 [IU]/h via INTRAVENOUS
  Filled 2019-01-19: qty 250

## 2019-01-19 MED ORDER — TIMOLOL HEMIHYDRATE 0.5 % OP SOLN: 1.0000 [drp] | Freq: Two times a day (BID) | OPHTHALMIC | Status: DC

## 2019-01-19 MED ORDER — VANCOMYCIN HCL 10 G IV SOLR
1500.0000 mg | Freq: Once | INTRAVENOUS | Status: AC
  Administered 2019-01-19: 1500 mg via INTRAVENOUS
  Filled 2019-01-19: qty 1500

## 2019-01-19 MED ORDER — VANCOMYCIN HCL IN DEXTROSE 1-5 GM/200ML-% IV SOLN: 1000.0000 mg | Freq: Once | INTRAVENOUS | Status: DC

## 2019-01-19 MED ORDER — ROSUVASTATIN CALCIUM 5 MG PO TABS
5.0000 mg | ORAL_TABLET | Freq: Every day | ORAL | Status: DC
  Administered 2019-01-20 – 2019-01-23 (×4): 5 mg via ORAL
  Filled 2019-01-19 (×5): qty 1

## 2019-01-19 MED ORDER — SODIUM CHLORIDE 0.9 % IV SOLN
2.0000 g | Freq: Once | INTRAVENOUS | Status: AC
  Administered 2019-01-19: 2 g via INTRAVENOUS
  Filled 2019-01-19: qty 2

## 2019-01-19 MED ORDER — METRONIDAZOLE IN NACL 5-0.79 MG/ML-% IV SOLN
500.0000 mg | Freq: Once | INTRAVENOUS | Status: AC
  Administered 2019-01-19: 500 mg via INTRAVENOUS
  Filled 2019-01-19: qty 100

## 2019-01-19 MED ORDER — SODIUM CHLORIDE 0.9 % IV SOLN
INTRAVENOUS | Status: DC
  Administered 2019-01-19 – 2019-01-21 (×4): via INTRAVENOUS

## 2019-01-19 MED ORDER — BRIMONIDINE TARTRATE 0.2 % OP SOLN
1.0000 [drp] | Freq: Two times a day (BID) | OPHTHALMIC | Status: DC
  Administered 2019-01-19 – 2019-01-25 (×12): 1 [drp] via OPHTHALMIC
  Filled 2019-01-19 (×2): qty 5

## 2019-01-19 MED ORDER — SODIUM CHLORIDE 0.9 % IV SOLN
2.0000 g | INTRAVENOUS | Status: DC
  Filled 2019-01-19: qty 2

## 2019-01-19 MED ORDER — FENTANYL CITRATE (PF) 100 MCG/2ML IJ SOLN
50.0000 ug | Freq: Once | INTRAMUSCULAR | Status: AC
  Administered 2019-01-19: 50 ug via INTRAVENOUS
  Filled 2019-01-19: qty 2

## 2019-01-19 MED ORDER — SODIUM CHLORIDE 0.9 % IV BOLUS
500.0000 mL | Freq: Once | INTRAVENOUS | Status: AC
  Administered 2019-01-19: 500 mL via INTRAVENOUS

## 2019-01-19 MED ORDER — SODIUM CHLORIDE 0.9 % IV SOLN: 2.0000 g | INTRAVENOUS | Status: DC

## 2019-01-19 MED ORDER — GABAPENTIN 300 MG PO CAPS
300.0000 mg | ORAL_CAPSULE | Freq: Three times a day (TID) | ORAL | Status: DC
  Administered 2019-01-19 – 2019-01-23 (×12): 300 mg via ORAL
  Filled 2019-01-19: qty 3
  Filled 2019-01-19 (×11): qty 1

## 2019-01-19 MED ORDER — MORPHINE SULFATE (PF) 4 MG/ML IV SOLN
4.0000 mg | Freq: Once | INTRAVENOUS | Status: AC
  Administered 2019-01-19: 4 mg via INTRAVENOUS
  Filled 2019-01-19: qty 1

## 2019-01-19 MED ORDER — VANCOMYCIN HCL 10 G IV SOLR: 1500.0000 mg | INTRAVENOUS | Status: DC

## 2019-01-19 MED ORDER — ACETAMINOPHEN 325 MG PO TABS
650.0000 mg | ORAL_TABLET | Freq: Four times a day (QID) | ORAL | Status: DC | PRN
  Administered 2019-01-19: 650 mg via ORAL
  Filled 2019-01-19: qty 2

## 2019-01-19 MED ORDER — PYRIDOSTIGMINE BROMIDE 60 MG PO TABS
60.0000 mg | ORAL_TABLET | Freq: Every day | ORAL | Status: DC
  Administered 2019-01-20 – 2019-01-23 (×4): 60 mg via ORAL
  Filled 2019-01-19 (×4): qty 1

## 2019-01-19 NOTE — ED Provider Notes (Addendum)
[facility] EMERGENCY DEPARTMENT Provider Note   CSN: [id number] Arrival date & time: 02/06/2019  0604     History Chief Complaint  Patient presents with   Emesis    [name] is a 78 y.o. male.  [name] is a 78 y.o. male with a complex medical history including CAD, CHF, hypertension, diabetes, CKD, myasthenia gravis, PE, who presents to the emergency department via EMS for evaluation of nausea, vomiting and fever.  Patient reports that over the past 2 weeks he has had frequent nausea and vomiting and reports that he has been having difficulty swallowing since a recent cervical spine fusion done by Dr. Ronnald Ramp at the end of September.  States for the first week or 2 after surgery he was fine but since has had difficulty swallowing, had CT soft tissue neck done with Dr. Ronnald Ramp on 12/4 that did not show any abnormality or fluid collection surrounding surgical site and this was felt to be unrelated, patient was also seen by GI on 12/8, and has been scheduled for EGD for evaluation of dysphagia on 12/30.  Patient's wife reports that nausea and vomiting got significantly worse last night and patient was found to be febrile at 102.4 at home last night.  This morning vomiting was continuing wife became concerned and called EMS.  With EMS patient was noted to be hypoxic with 90% sats on room air with tachypnea, was placed on 2 L nasal cannula and has been comfortable with normal O2 sats, noted to be tachycardic and febrile at 101.9 with EMS.  Patient received 600 mL fluid bolus in route and Zofran and has not had any episodes of vomiting since then.  Patient does report that he has had a cough recently with mucus production.  He denies chest pain or shortness of breath, does not require any oxygen at home.  Despite multiple episodes of vomiting patient denies any associated abdominal pain or abdominal distention.  He has not had any blood in his vomit.  Has been having normal bowel  movements, no diarrhea, no blood in his stool.  He denies dysuria or urinary frequency.  No known sick contacts, aside from going to doctors appointments he reports that he and his wife primarily stay home.  Patient is also complaining of some pain in his low back and left hip which wife reports is a chronic issue that was worsened as patient was jostled around and moved to during ambulance ride in transfer.  No numbness or weakness.  History of right AKA.        Past Medical History:  Diagnosis Date   Bruises easily    d/t being on Eliquis   Carotid artery disease ([place])    a. Duplex 05/2014: 61-44% RICA, 3-15% LICA, elevated velocities in right subclavian artery, normal left subclavian artery..   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 cath in 2003 showed 10-20% LM, scattered 20% prox-mid LAD, 30% more distal LAD, 50-60% stenosis of LAD towards apex, 20% Cx, 30% dRCA, focal 95% stenosis in smaller of 2 branches of PDA, EF 60-65%.   Diabetes mellitus ([place])    no longer- cancxelled by Dr  Ellision   Diarrhea    takes Imodium daily as needed   Diverticulosis    Diverticulosis of colon (without mention of hemorrhage)    DJD (degenerative joint disease)    DVT (deep venous thrombosis) ([place]) 07/2011   Esophageal stricture    Essential hypertension    takes Imdur and Lisinopril daily   Gastric polyps    benign   GERD (gastroesophageal reflux disease)    takes Nexium daily   Glaucoma    uses eye drops   History of blood transfusion `   leg amputation   History of bronchitis    not sure when the last time   History of colon polyps    benign   History of kidney stones    litrotrispspy   History of kidney stones    passed    Insomnia    takes Melatonin nightly as needed   Mixed hyperlipidemia    takes Crestor daily   Myasthenia gravis ([place])    uses prednisone   Osteoporosis 2016   Peripheral edema    takes Lasix daily   Peripheral vascular disease ([place])    Pituitary tumor    checks Dr[facility]Walden at [facility] checks it every Jan    Pneumonia 01/2016   PONV (postoperative nausea and vomiting)    Ptosis    Pulmonary embolus ([place]) 2012   Stroke ([place]) 06/2015   x 3- last one 2018- balance issuses    Patient Active Problem List   Diagnosis Date Noted   Neck muscle spasm 12/20/2018   Urinary frequency 12/20/2018   Fever 12/14/2018   Urinary tract infection without hematuria 12/14/2018   Benign essential HTN    Chronic myeloid leukemia ([place])    Labile blood pressure    Acute on chronic anemia    Neuropathic pain    Hypoalbuminemia due to protein-calorie malnutrition (HCC)    Steroid-induced hyperglycemia    UTI due to Klebsiella species    Cervical myelopathy ([place]) 11/11/2018   S/P cervical spinal fusion 11/09/2018   Neuropathic pain of right ankle 06/27/2018   Insomnia due to medical condition 04/13/2018   Atherosclerosis of native coronary artery of native heart without angina pectoris    Hypokalemia    Stage 3 chronic kidney disease    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/10/2018   Acute blood loss anemia 02/08/2018   Post-operative pain    Unilateral AKA, right (HCC)    Ischemic foot 02/05/2018   Ischemia of right lower extremity    Pressure injury of skin 12/27/2017   Arterial occlusive disease    Postoperative pain    CKD (chronic kidney disease), stage III    History of CVA (cerebrovascular accident)    Femoral artery thrombosis, 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   Septic arthritis of left sternoclavicular joint ([place]) 11/03/2017   Cervical spinal cord compression ([place]) 10/30/2017   Nasal abscess 09/25/2017   Immunocompromised ([place]) 09/25/2017   Acute on chronic  blood loss anemia 08/05/2017   Transient ischemic attack (TIA) 07/23/2017   Subcortical infarction ([place])    Right hemiparesis ([place])    Ataxia 07/21/2017   TIA (transient ischemic attack)    Wheezing 06/17/2017   Bursitis of left elbow 05/01/2017   Dystonia 12/29/2016   PAD (peripheral artery disease) ([place]) 11/18/2016   Osteoporosis 08/28/2016   Paronychia of great toe, left 08/10/2016   Carotid arterial disease ([place]) 07/09/2016   Hypocalcemia 07/09/2016   Diplopia 07/02/2016   S/P lumbar laminectomy 03/11/2016   Radicular pain of  left lower extremity 02/21/2016   Cough 01/10/2016   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/24/2015   Myasthenia gravis ([place])    Diabetes mellitus type 2 in nonobese ([place])    Ataxia, post-stroke    Gait disturbance, post-stroke    Proximal leg weakness    Cervical spondylosis without myelopathy    Achilles tendinitis of right lower extremity    Chronic diastolic CHF (congestive heart failure) (HCC)    Gastroesophageal reflux disease without esophagitis    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   Cerebellar stroke (HCC)    Ischemic stroke ([place])    Cerebrovascular accident (CVA) due to thrombosis of left vertebral artery (HCC)    History of pulmonary embolus (PE)    Chronic anticoagulation    HLD (hyperlipidemia)    Neck pain 03/19/2015   Pain in the chest    Diabetes mellitus type II, controlled ([place])    Diabetic gastroparesis ([place]) 03/07/2015   Screening for osteoporosis 07/19/2014   Dyspnea 04/09/2014   Edema 04/09/2014   Routine general medical examination at a health care facility 10/23/2013   Bilateral carotid bruits 05/24/2013   Abnormal CT scan, lung 05/24/2013   Encounter for therapeutic drug monitoring 03/14/2013   Lymphadenopathy, inguinal 01/11/2013   AKI (acute kidney injury) ([place]) 10/28/2012   Spinal stenosis of lumbar region 06/01/2012    Myasthenia gravis with exacerbation ([place]) 03/31/2012   Disease of salivary gland 12/09/2011   Left facial pain 10/28/2011   Pulmonary embolism ([place]) 10/25/2011   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   GERD with stricture 09/23/2011   Long term (current) use of anticoagulants 08/03/2011   History of pulmonary embolism 07/30/2011   History of DVT (deep vein thrombosis) 07/30/2011   Subconjunctival hemorrhage 06/22/2011   Bilateral conjunctivitis 04/09/2011   Diabetes mellitus type 2, controlled ([place]) 12/18/2010   Hearing loss 08/04/2010   Ptosis of eyelid 12/30/2009   Essential hypertension 04/24/2009   HYPERLIPIDEMIA, MIXED 10/05/2008   HEMORRHOIDS-INTERNAL 07/02/2008   HEMORRHOIDS-EXTERNAL 07/02/2008   DIVERTICULOSIS-COLON 07/02/2008   PERSONAL HX COLONIC POLYPS 07/02/2008   RASH-NONVESICULAR 02/07/2008   PNEUMONIA 12/08/2007   Chronic myeloid leukemia in remission ([place]) 07/07/2007   CAD (coronary artery disease) 07/07/2007   NEPHROLITHIASIS, HX OF 07/07/2007    Past Surgical History:  Procedure Laterality Date   ABDOMINAL AORTOGRAM W/LOWER EXTREMITY N/A 11/18/2016   Procedure: ABDOMINAL AORTOGRAM W/LOWER EXTREMITY;  Surgeon: [name], MD;  Location: [place] CV LAB;  Service: Cardiovascular;  Laterality: N/A;   AMPUTATION Right 02/07/2018   Procedure: right leg AMPUTATION ABOVE KNEE;  Surgeon: [name], MD;  Location: [facility] OR;  Service: Vascular;  Laterality: Right;   ANTERIOR CERVICAL DECOMP/DISCECTOMY FUSION N/A 11/09/2018   Procedure: CERVICAL THREE-FOUR ANTERIOR CERVICAL DECOMPRESSION/DISCECTOMY FUSION WITH CERVICAL FIVE CORPECTOMY;  Surgeon: [name], MD;  Location: [place];  Service: Neurosurgery;  Laterality: N/A;   AORTOGRAM Right 12/23/2017   Procedure: AORTOGRAM, RIGHT LOWER EXTREMITY ANGIOGRAM, RIGHT SUPERFICIAL FEMORAL ARTERY ANGIOPLASTY WITH STENT, RIGHT POPLITEAL ARTERY ANGIOPLASTY WITH STENT, LEFT GROIN CUTDOWN  AND LEFT FEMORAL ARTERY REPAIR;  Surgeon: [name], MD;  Location: [place];  Service: Vascular;  Laterality: Right;   BACK SURGERY     x2, lumbar and cervical   CARDIAC CATHETERIZATION     CARPAL TUNNEL RELEASE Left 09/23/2018   Procedure: CARPAL TUNNEL RELEASE;  Surgeon: [name], MD;  Location: [place];  Service: Neurosurgery;  Laterality: Left;  CARPAL TUNNEL RELEASE  CATARACT EXTRACTION Bilateral 12/12   COLONOSCOPY     ESOPHAGOGASTRODUODENOSCOPY ENDOSCOPY  04/2015   IR GENERIC HISTORICAL  03/03/2016   IR IVC FILTER PLMT / S&I [name] GUID/MOD SED 03/03/2016 [name], MD [facility]INTER[facility] RAD   IR GENERIC HISTORICAL  04/09/2016   IR RADIOLOGIST EVAL & MGMT 04/09/2016 [name], MD [facility] INTER[facility] RAD   IR GENERIC HISTORICAL  04/30/2016   IR IVC FILTER RETRIEVAL / S&I [name] GUID/MOD SED 04/30/2016 [name], MD [facility]-INTERV RAD   LITHOTRIPSY     LUMBAR LAMINECTOMY/DECOMPRESSION MICRODISCECTOMY Right 09/14/2012   Procedure: Right Lumbar three-four, four-five, Lumbar five-Sacral one decompressive laminectomy;  Surgeon: [name], MD;  Location: [place] NEURO ORS;  Service: Neurosurgery;  Laterality: Right;   LUMBAR LAMINECTOMY/DECOMPRESSION MICRODISCECTOMY Left 03/11/2016   Procedure: Left Lumbar Two-ThreeMicrodiscectomy;  Surgeon: [name], MD;  Location: [place];  Service: Neurosurgery;  Laterality: Left;   PERIPHERAL VASCULAR INTERVENTION  11/18/2016   Procedure: PERIPHERAL VASCULAR INTERVENTION;  Surgeon: [name], MD;  Location: [place] CV LAB;  Service: Cardiovascular;;  Left popliteal   SHOULDER SURGERY Left 05/07/2009   TONSILLECTOMY     ULNAR NERVE TRANSPOSITION Left 09/23/2018   Procedure: ULNAR NERVE DECOMPRESSION/TRANSPOSITION;  Surgeon: [name], MD;  Location: [place];  Service: Neurosurgery;  Laterality: Left;  ULNAR NERVE DECOMPRESSION/TRANSPOSITION       Family History  Problem Relation Age of Onset   Heart disease Mother    Heart  attack Mother    Stroke Mother    Heart disease Father    Heart attack Father    Stroke Father    Breast cancer Sister        Twin    Heart attack Sister    Dementia Sister    Heart disease Sister    Heart attack Sister    Clotting disorder Sister    Heart attack Sister    Colon cancer Neg Hx     Social History   Tobacco Use   Smoking status: Never Smoker   Smokeless tobacco: Never Used  Substance Use Topics   Alcohol use: No    Alcohol/week: 0.0 standard drinks   Drug use: No    Home Medications Prior to Admission medications   Medication Sig Start Date End Date Taking? Authorizing Provider  acetaminophen (TYLENOL) 325 MG tablet Take 2 tablets (650 mg total) by mouth every 4 (four) hours as needed for mild pain ((score 1 to 3) or temp > 100.5). 11/22/18   Angiulli, Lavon Paganini, PA-C  apixaban (ELIQUIS) 2.5 MG TABS tablet Take 1 tablet (2.5 mg total) by mouth 2 (two) times daily. Patient taking differently: Take 2.5 mg by mouth daily.  11/22/18   Angiulli, Lavon Paganini, PA-C  brimonidine (ALPHAGAN) 0.2 % ophthalmic solution Place 1 drop into both eyes 2 (two) times daily.    [provider]  cholecalciferol (VITAMIN D) 1000 units tablet Take 1,000 Units by mouth daily.     [provider]  diclofenac sodium (VOLTAREN) 1 % GEL Apply 2 g topically 3 (three) times daily as needed. 11/22/18   Angiulli, Lavon Paganini, PA-C  diphenoxylate-atropine (LOMOTIL) 2.5-0.025 MG tablet Take 1 tablet by mouth every 6 (six) hours as needed for diarrhea or loose stools. 05/19/18   Armbruster, Carlota Raspberry, MD  furosemide (LASIX) 40 MG tablet Take 1 tablet (40 mg total) by mouth daily. 11/23/18   Angiulli, Lavon Paganini, PA-C  gabapentin (NEURONTIN) 300 MG capsule Take 1 capsule (300 mg total)  by mouth 3 (three) times daily. 11/22/18   Angiulli, Lavon Paganini, PA-C  imatinib (GLEEVEC) 100 MG tablet Take 300 mg by mouth daily with lunch. Take with noon meal and a large glass of  water.Caution:Chemotherapy    [provider]  isosorbide mononitrate (IMDUR) 30 MG 24 hr tablet Take 1 tablet (30 mg total) by mouth daily. 11/22/18   Angiulli, Lavon Paganini, PA-C  lidocaine (LMX) 4 % cream Apply topically 4 (four) times daily. Apply to affected areas as directed 11/22/18   Angiulli, Lavon Paganini, PA-C  methocarbamol (ROBAXIN) 500 MG tablet Take 1 tablet (500 mg total) by mouth every 6 (six) hours as needed for muscle spasms. 12/20/18   Libby Maw, MD  Multiple Vitamin (MULTIVITAMIN) tablet Take 1 tablet by mouth daily.    [provider]  multivitamin-lutein (OCUVITE-LUTEIN) CAPS capsule Take 1 capsule by mouth daily.    [provider]  omeprazole (PRILOSEC) 20 MG capsule Take 1 capsule (20 mg total) by mouth daily. 01/17/19   Noralyn Pick, NP  ondansetron (ZOFRAN) 4 MG tablet Take 1 tablet (4 mg total) by mouth every 8 (eight) hours as needed for nausea or vomiting. 12/07/18   Meredith Staggers, MD  oxyCODONE (OXY IR/ROXICODONE) 5 MG immediate release tablet Take 1 tablet (5 mg total) by mouth every 3 (three) hours as needed for moderate pain ((score 4 to 6)). 11/22/18   Angiulli, Lavon Paganini, PA-C  pyridostigmine (MESTINON) 60 MG tablet Take 60 mg by mouth daily.    [provider]  rosuvastatin (CRESTOR) 10 MG tablet Take 5 mg by mouth daily.    [provider]  senna (SENOKOT) 8.6 MG TABS tablet Take 1 tablet (8.6 mg total) by mouth 2 (two) times daily. 11/22/18   Angiulli, Lavon Paganini, PA-C  timolol (BETIMOL) 0.5 % ophthalmic solution Place 1 drop into both eyes 2 (two) times daily.    [provider]    Allergies    Other, Phenergan [promethazine hcl], Zofran [ondansetron hcl], and Sulfamethoxazole  Review of Systems   Review of Systems  Constitutional: Positive for chills and fever.  HENT: Positive for trouble swallowing. Negative for congestion, rhinorrhea and sore throat.   Respiratory: Positive for  cough and shortness of breath.   Cardiovascular: Negative for chest pain.  Gastrointestinal: Positive for nausea and vomiting. Negative for abdominal pain, blood in stool, constipation and diarrhea.  Genitourinary: Negative for dysuria and frequency.  Musculoskeletal: Positive for arthralgias and back pain.       Patient began having some back and hip pain after he was jostled around on ambulance ride, wife reports this is a chronic issue  Skin: Negative for color change and rash.  Neurological: Negative for dizziness, syncope and headaches.  All other systems reviewed and are negative.   Physical Exam Updated Vital Signs BP (!) 116/57    Pulse (!) 115    Temp 99.8 F (37.7 C) (Oral)    Resp (!) 21    SpO2 100%   Physical Exam Vitals and nursing note reviewed.  Constitutional:      General: He is not in acute distress.    Appearance: He is well-developed. He is ill-appearing. He is not diaphoretic.     Comments: Ill-appearing but in no acute distress  HENT:     Head: Normocephalic and atraumatic.     Mouth/Throat:     Pharynx: No oropharyngeal exudate or posterior oropharyngeal erythema.     Comments: Tongue with some  brown coating, posterior oropharynx clear with no erythema or edema, patient tolerating secretions, normal phonation Eyes:     General:        Right eye: No discharge.        Left eye: No discharge.  Cardiovascular:     Rate and Rhythm: Regular rhythm. Tachycardia present.     Pulses: Normal pulses.     Heart sounds: Normal heart sounds. No murmur. No friction rub. No gallop.      Comments: Tachycardia with regular rhythm Pulmonary:     Effort: Pulmonary effort is normal. No respiratory distress.     Breath sounds: Rhonchi present. No wheezing or rales.     Comments: Respirations equal and unlabored on 2 L nasal cannula, patient able to speak in full sentences.  On auscultation some faint rhonchi noted in lower lung fields, some decreased air movement, no wheezes  or rales Abdominal:     General: Bowel sounds are normal. There is no distension.     Palpations: Abdomen is soft. There is no mass.     Tenderness: There is no abdominal tenderness. There is no guarding.     Comments: Abdomen soft, nondistended, nontender to palpation in all quadrants without guarding or peritoneal signs  Musculoskeletal:     Cervical back: Neck supple.     Comments: Patient reports pain in the low back and hip after he was moved around by EMS.  No deformity, rotation or shortening, distal pulses intact. Right AKA  Skin:    General: Skin is warm and dry.     Capillary Refill: Capillary refill takes less than 2 seconds.  Neurological:     Mental Status: He is alert and oriented to person, place, and time.     Coordination: Coordination normal.     Comments: Speech is clear, able to follow commands Moves extremities without ataxia, coordination intact  Psychiatric:        Mood and Affect: Mood normal.        Behavior: Behavior normal.     ED Results / Procedures / Treatments   Labs (all labs ordered are listed, but only abnormal results are displayed) Labs Reviewed  COMPREHENSIVE METABOLIC PANEL - Abnormal; Notable for the following components:      Result Value   Potassium 3.0 (*)    Creatinine, Ser 1.84 (*)    Calcium 8.5 (*)    Total Protein 5.2 (*)    Albumin 3.2 (*)    AST 45 (*)    Total Bilirubin 1.3 (*)    GFR calc non Af Amer 34 (*)    GFR calc Af Amer 39 (*)    All other components within normal limits  CBC WITH DIFFERENTIAL/PLATELET - Abnormal; Notable for the following components:   WBC 10.9 (*)    RBC 3.24 (*)    Hemoglobin 10.2 (*)    HCT 31.0 (*)    Monocytes Absolute 1.7 (*)    All other components within normal limits  D-DIMER, QUANTITATIVE (NOT AT [facility]) - Abnormal; Notable for the following components:   D-Dimer, Quant 2.69 (*)    All other components within normal limits  LACTATE DEHYDROGENASE - Abnormal; Notable for the following  components:   LDH 221 (*)    All other components within normal limits  C-REACTIVE PROTEIN - Abnormal; Notable for the following components:   CRP 1.2 (*)    All other components within normal limits  CULTURE, BLOOD (ROUTINE X 2)  CULTURE, BLOOD (ROUTINE  X 2)  URINE CULTURE  LACTIC ACID, PLASMA  APTT  PROTIME-INR  LIPASE, BLOOD  FERRITIN  TRIGLYCERIDES  FIBRINOGEN  LACTIC ACID, PLASMA  URINALYSIS, ROUTINE W REFLEX MICROSCOPIC  PROCALCITONIN  MAGNESIUM  POC SARS CORONAVIRUS 2 AG -  ED    EKG EKG Interpretation  Date/Time:  Thursday January 19 2019 06:15:02 EST Ventricular Rate:  108 PR Interval:    QRS Duration: 72 QT Interval:  322 QTC Calculation: 432 R Axis:   -41 Text Interpretation: Sinus tachycardia with irregular rate Inferior infarct, old Anterior infarct, old Confirmed by Veryl Speak [phone]) on 02/03/2019 7:01:30 AM   Radiology DG Abdomen 1 View  Result Date: 02/09/2019 CLINICAL DATA:  Abdominal swelling, fever EXAM: ABDOMEN - 1 VIEW COMPARISON:  06/17/2015 FINDINGS: Nonspecific bowel gas pattern without evidence of obstruction. No gross free intraperitoneal air. No radio-opaque calculi or other significant radiographic abnormality are seen. IMPRESSION: Nonobstructive bowel gas pattern. Electronically Signed   By: Davina Poke M.D.   On: 02/02/2019 09:48   DG Chest Port 1 View  Result Date: 01/17/2019 CLINICAL DATA:  Assess for obstruction. Fever and weakness with nausea and vomiting 2 weeks. EXAM: PORTABLE CHEST 1 VIEW COMPARISON:  10/30/2018 FINDINGS: Lungs are adequately inflated with airspace consolidation over the right base which may be due to pneumonia or atelectasis. No evidence of effusion. Borderline stable cardiomegaly. Remainder of the exam is unchanged. IMPRESSION: Opacification over the right base which may be due to atelectasis or pneumonia. Electronically Signed   By: Marin Olp M.D.   On: 02/07/2019 09:44    Procedures .Critical  Care Performed by: Jacqlyn Larsen, PA-C Authorized by: Jacqlyn Larsen, PA-C   Critical care provider statement:    Critical care time (minutes):  45   Critical care was necessary to treat or prevent imminent or life-threatening deterioration of the following conditions:  Sepsis   Critical care was time spent personally by me on the following activities:  Discussions with consultants, evaluation of patient's response to treatment, examination of patient, ordering and performing treatments and interventions, ordering and review of laboratory studies, ordering and review of radiographic studies, pulse oximetry, re-evaluation of patient's condition, obtaining history from patient or surrogate and review of old charts   (including critical care time)  Medications Ordered in ED Medications  vancomycin (VANCOCIN) 1,500 mg in sodium chloride 0.9 % 500 mL IVPB (1,500 mg Intravenous New Bag/Given 01/30/2019 0839)  ceFEPIme (MAXIPIME) 2 g in sodium chloride 0.9 % 100 mL IVPB (has no administration in time range)  vancomycin (VANCOCIN) 1,500 mg in sodium chloride 0.9 % 500 mL IVPB (has no administration in time range)  fentaNYL (SUBLIMAZE) injection 50 mcg (has no administration in time range)  potassium chloride 10 mEq in 100 mL IVPB (has no administration in time range)  ceFEPIme (MAXIPIME) 2 g in sodium chloride 0.9 % 100 mL IVPB (0 g Intravenous Stopped 01/29/2019 0836)  metroNIDAZOLE (FLAGYL) IVPB 500 mg (0 mg Intravenous Stopped 02/07/2019 0938)  sodium chloride 0.9 % bolus 1,000 mL (0 mLs Intravenous Stopped 02/05/2019 0938)  morphine 4 MG/ML injection 4 mg (4 mg Intravenous Given 01/11/2019 0741)    ED Course  I have reviewed the triage vital signs and the nursing notes.  Pertinent labs & imaging results that were available during my care of the patient were reviewed by me and considered in my medical decision making (see chart for details).  Clinical Course as of Jan 19 1036  Thu Jan 19, 2019  0630  On arrival patient has low-grade temp, fever of 101.9 documented with EMS and patient is tachycardic, requiring 2 L nasal cannula.  Code sepsis initiated with broad-spectrum antibiotics as there is not currently a clear source of infection question whether this could be related to patient's previous surgery, pneumonia, abdominal, or Covid related.  Given that patient's blood pressure is stable and there is potentially some concern for Covid 1 L fluid bolus given but will avoid 30 cc/kg fluid bolus  Temp: 99.8 F (37.7 C) [KF]  0800 Mild leukocytosis of 10.9, stable hemoglobin  CBC WITH DIFFERENTIAL(!) [KF]  0800 Hypokalemia of 3.0, IV potassium replacement ordered, would like to avoid p.o. meds given reported history of dysphagia.  Slight bump in creatinine, 1.84 today usually around 1.6  Comprehensive metabolic panel(!) [KF]  4707 Lactic acid is not elevated which is reassuring and does not suggest severe sepsis  Lactic acid, plasma [KF]  0900 Rapid Covid antigen is negative but patient does have some elevation in his inflammatory markers, awaiting chest x-ray, continues to have some possible suspicion for Covid, confirmatory PCR will be ordered  POC SARS Coronavirus 2 Ag-ED - Nasal Swab (BD Veritor Kit) [KF]  5324 78 year old male with multiple medical problems here with weeks of dysphagia and acute vomiting overnight.  He appears dehydrated but he is also febrile here.  Getting labs cultures antibiotics fluids.  Will need admission.   [MB]  6151 Chest x-ray with opacity in the right lower lung field concerning for pneumonia versus atelectasis, in the setting of fever and hypoxia I favor this being pneumonia.  Given patient's history of dysphagia he is certainly at risk for aspiration pneumonia.  Abdominal x-ray shows no evidence of obstructive bowel gas pattern.  DG Chest Port 1 View [KF]  1007 We will consult hospitalist for admission with sepsis due to pneumonia, patient with history of dysphagia  putting him at risk for aspiration will likely need work-up for dysphagia during admission as well.  Has been seen by low our GI and was scheduled for outpatient EGD on 12/30   [KF]  1036 Case discussed with Dr. Lorin Mercy with [facility] hospitalist who will see and admit the patient.  Requests rapid Covid PCR test   [KF]    Clinical Course User Index [KF] Jacqlyn Larsen, PA-C [MB] Hayden Rasmussen, MD    MDM Rules/Calculators/A&P  Patient presents with fever, tachycardia and new hypoxia with 2 L oxygen requirement.  Has had history of dysphagia over the past few weeks and last night had several episodes of vomiting but has not experienced any abdominal pain.  Code sepsis initiated with broad-spectrum antibiotics for unidentified source.  Patient given 1 L IV fluids, 30 cc/kg fluid bolus avoided given his concern for possible Covid, and patient's history of CHF with EF of 50%.  Lactic is stable at 1.8 and patient has not had any hypotension.  His labs showed a mild leukocytosis, potassium of 3.0 and mild bump in creatinine at 1.84, no other significant derangements.  Some elevation in inflammatory markers, Covid antigen test negative, rapid Covid PCR ordered.  Chest x-ray shows opacity in the right lower lung concerning for pneumonia especially in the setting of fever and hypoxia, given patient's history of dysphagia he is certainly at risk for aspiration pneumonia.  No abdominal tenderness, vomiting has resolved and x-ray without obstructive bowel gas pattern.  Patient will be admitted to hospitalist service for treatment of sepsis likely from pneumonia, waiting to rule out  Covid.  Patient will also likely need work-up for dysphagia.  Patient is in agreement with this plan.  Case discussed with Dr. Lorin Mercy who will see and admit the patient.  Final Clinical Impression(s) / ED Diagnoses Final diagnoses:  Sepsis due to pneumonia [facility])  Dysphagia, unspecified type    Rx / DC Orders ED Discharge Orders     None       Jacqlyn Larsen, PA-C 01/10/2019 1234    Jacqlyn Larsen, PA-C 01/31/2019 1650    Hayden Rasmussen, MD 01/31/2019 1919

## 2019-01-19 NOTE — Consult Note (Addendum)
[facility] Consult: 12:42 PM 02/09/2019  LOS: 0 days    Referring Provider: Dr Karmen Bongo  Primary Care Physician:  Libby Maw, MD Primary Gastroenterologist:  Dr. Havery Moros     Reason for Consultation:  Dysphagia.     HPI: [name] is a 78 y.o. male.  Hx multiple medical problems outlined below.   Chronic anemia.  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  Chronic Eliquis for hx DVT, carotid artery disease, stroke. S/p anterior cervical discectomy, fusion 11/09/2018.  07/2009 EGD: gastritis, no H Pylori.   12/2013 colonoscopy.  For hx TVA, no GHD 2010.  Sigmoid diverticulosis.  No recurrent polyps.  It was felt that he would not require repeat colonoscopy due to advanced age. 04/2015 EGD. For dysphagia.  Diffused mild gastric erythema, biopsied.  Small, sessile gastric polyps removed.  8 mm duodenal polyps removed and clipped.  Path: Duodenal polyp: hyperplastic Brunner's glands, no HGD.  Gastric polyps: fundic gland type with mild chronic inflammation.  No H. pylori.  Gastritis: benign gastric mucosa with minor chronic inflammation, no H. pylori. 10/2015 barium esophagram: Nonspecific esophageal dysmotility.  13 mm tablet passed freely to the stomach.  Covid 19 negative.    Patient set up for EGD with Dr. Havery Moros on 02/08/2019.Marland Kitchen  Seen by NP in the office 01/17/2023 sense of swelling in his throat and difficulty swallowing which began in the third week of November, patient estimates about 6 weeks after his neck surgery.  There is a lot of thick phlegm in his throat.  Solid food gets stuck when he eats.  Drinking water helps chase the food down into his stomach.  Coughs up white mucus. No nausea, vomiting, weight loss.  He had discussed the situation with neurosurgeon, Dr. Ronnald Ramp who felt that the  dysphagia was not likely related to his cervical spine surgery.  CT scan of the neck 01/13/2019 revealed postsurgical changes in the cervical spine, chronic right maxillary sinusitis, punctate left submandibularly sialolithiasis.  Last night patient had soup and a boost for dinner.  A few hours later he started vomiting up partially digested material and developed sense of fever.  He had no bloody or dark emesis.  Temp at home 102.4, 101.9 per EMS along with sats 90%.   WBCs 10.9.  Hb 10.2, stable with his baseline of 9-10.5.  Was 8 on 11/15/18.  MCV stable in mid 90s. Hypokalemic at 3.  BUN normal but creatinine is 1.8 that is up from previous baseline of 1.5-1.6 and GFR is 39 compared with 46-47 within the past couple of months.   CXR shows right basilar pneumonia vs atelectasis.  KUB with NOBGP.   He has received antibiotics, IV fluids.   Past Medical History:  Diagnosis Date   Bruises easily    d/t being on Eliquis   Carotid artery disease ([place])    a. Duplex 05/2014: 123456 RICA, 123456 LICA, elevated velocities in right subclavian artery, normal left subclavian artery..   Chronic back pain    HNP   CKD (chronic kidney disease)  Claustrophobia    takes Ativan as needed   CML (chronic myeloid leukemia) ([place])    takes Gleevec daily   Coronary atherosclerosis of unspecified type of vessel, native or graft    a. cath in 2003 showed 10-20% LM, scattered 20% prox-mid LAD, 30% more distal LAD, 50-60% stenosis of LAD towards apex, 20% Cx, 30% dRCA, focal 95% stenosis in smaller of 2 branches of PDA, EF 60-65%.   Diabetes mellitus ([place])    no longer- cancxelled by Dr Hale Bogus   Diarrhea    takes Imodium daily as needed   Diverticulosis    Diverticulosis of colon (without mention of hemorrhage)    DJD (degenerative joint disease)    DVT (deep venous thrombosis) ([place]) 07/2011   Esophageal stricture    Essential hypertension    takes Imdur and Lisinopril daily   Gastric  polyps    benign   GERD (gastroesophageal reflux disease)    takes Nexium daily   Glaucoma    uses eye drops   History of blood transfusion `   leg amputation   History of bronchitis    not sure when the last time   History of colon polyps    benign   History of kidney stones    litrotrispspy   Insomnia    takes Melatonin nightly as needed   Mixed hyperlipidemia    takes Crestor daily   Myasthenia gravis ([place])    uses prednisone   Osteoporosis 2016   Peripheral edema    takes Lasix daily   Peripheral vascular disease ([place])    Pituitary tumor    checks Dr[facility]Walden at [facility] checks it every Jan    PONV (postoperative nausea and vomiting)    Ptosis    Pulmonary embolus ([place]) 2012   Stroke ([place]) 06/2015   x 3- last one 2018- balance issuses    Past Surgical History:  Procedure Laterality Date   ABDOMINAL AORTOGRAM W/LOWER EXTREMITY N/A 11/18/2016   Procedure: ABDOMINAL AORTOGRAM W/LOWER EXTREMITY;  Surgeon: [name], MD;  Location: [place] CV LAB;  Service: Cardiovascular;  Laterality: N/A;   AMPUTATION Right 02/07/2018   Procedure: right leg AMPUTATION ABOVE KNEE;  Surgeon: [name], MD;  Location: [facility] OR;  Service: Vascular;  Laterality: Right;   ANTERIOR CERVICAL DECOMP/DISCECTOMY FUSION N/A 11/09/2018   Procedure: CERVICAL THREE-FOUR ANTERIOR CERVICAL DECOMPRESSION/DISCECTOMY FUSION WITH CERVICAL FIVE CORPECTOMY;  Surgeon: [name], MD;  Location: [place];  Service: Neurosurgery;  Laterality: N/A;   AORTOGRAM Right 12/23/2017   Procedure: AORTOGRAM, RIGHT LOWER EXTREMITY ANGIOGRAM, RIGHT SUPERFICIAL FEMORAL ARTERY ANGIOPLASTY WITH STENT, RIGHT POPLITEAL ARTERY ANGIOPLASTY WITH STENT, LEFT GROIN CUTDOWN AND LEFT FEMORAL ARTERY REPAIR;  Surgeon: [name], MD;  Location: [place];  Service: Vascular;  Laterality: Right;   BACK SURGERY     x2, lumbar and cervical   CARDIAC CATHETERIZATION     CARPAL TUNNEL RELEASE  Left 09/23/2018   Procedure: CARPAL TUNNEL RELEASE;  Surgeon: [name], MD;  Location: [place];  Service: Neurosurgery;  Laterality: Left;  CARPAL TUNNEL RELEASE   CATARACT EXTRACTION Bilateral 12/12   COLONOSCOPY     ESOPHAGOGASTRODUODENOSCOPY ENDOSCOPY  04/2015   IR GENERIC HISTORICAL  03/03/2016   IR IVC FILTER PLMT / S&I [name] GUID/MOD SED 03/03/2016 [name], MD [facility]INTERV RAD   IR GENERIC HISTORICAL  04/09/2016   IR RADIOLOGIST EVAL & MGMT 04/09/2016 [name], MD [facility] INTER[facility] RAD   IR GENERIC HISTORICAL  04/30/2016  IR IVC FILTER RETRIEVAL / S&I [name] GUID/MOD SED 04/30/2016 [name], MD [facility]-INTERV RAD   LITHOTRIPSY     LUMBAR LAMINECTOMY/DECOMPRESSION MICRODISCECTOMY Right 09/14/2012   Procedure: Right Lumbar three-four, four-five, Lumbar five-Sacral one decompressive laminectomy;  Surgeon: [name], MD;  Location: [place] NEURO ORS;  Service: Neurosurgery;  Laterality: Right;   LUMBAR LAMINECTOMY/DECOMPRESSION MICRODISCECTOMY Left 03/11/2016   Procedure: Left Lumbar Two-ThreeMicrodiscectomy;  Surgeon: [name], MD;  Location: [place];  Service: Neurosurgery;  Laterality: Left;   PERIPHERAL VASCULAR INTERVENTION  11/18/2016   Procedure: PERIPHERAL VASCULAR INTERVENTION;  Surgeon: [name], MD;  Location: [place] CV LAB;  Service: Cardiovascular;;  Left popliteal   SHOULDER SURGERY Left 05/07/2009   TONSILLECTOMY     ULNAR NERVE TRANSPOSITION Left 09/23/2018   Procedure: ULNAR NERVE DECOMPRESSION/TRANSPOSITION;  Surgeon: [name], MD;  Location: [place];  Service: Neurosurgery;  Laterality: Left;  ULNAR NERVE DECOMPRESSION/TRANSPOSITION    Prior to Admission medications   Medication Sig Start Date End Date Taking? Authorizing Provider  acetaminophen (TYLENOL) 325 MG tablet Take 2 tablets (650 mg total) by mouth every 4 (four) hours as needed for mild pain ((score 1 to 3) or temp > 100.5). 11/22/18  Yes Angiulli, Lavon Paganini, PA-C  apixaban (ELIQUIS)  2.5 MG TABS tablet Take 1 tablet (2.5 mg total) by mouth 2 (two) times daily. Patient taking differently: Take 2.5 mg by mouth daily.  11/22/18  Yes Angiulli, Lavon Paganini, PA-C  brimonidine (ALPHAGAN) 0.2 % ophthalmic solution Place 1 drop into both eyes 2 (two) times daily.   Yes [provider]  cholecalciferol (VITAMIN D) 1000 units tablet Take 1,000 Units by mouth daily.    Yes [provider]  diclofenac sodium (VOLTAREN) 1 % GEL Apply 2 g topically 3 (three) times daily as needed. Patient taking differently: Apply 2 g topically 3 (three) times daily as needed (pain).  11/22/18  Yes Angiulli, Lavon Paganini, PA-C  diphenoxylate-atropine (LOMOTIL) 2.5-0.025 MG tablet Take 1 tablet by mouth every 6 (six) hours as needed for diarrhea or loose stools. 05/19/18  Yes Armbruster, Carlota Raspberry, MD  furosemide (LASIX) 40 MG tablet Take 1 tablet (40 mg total) by mouth daily. 11/23/18  Yes Angiulli, Lavon Paganini, PA-C  gabapentin (NEURONTIN) 300 MG capsule Take 1 capsule (300 mg total) by mouth 3 (three) times daily. 11/22/18  Yes Angiulli, Lavon Paganini, PA-C  imatinib (GLEEVEC) 100 MG tablet Take 300 mg by mouth daily with lunch. Take with noon meal and a large glass of water.Caution:Chemotherapy   Yes [provider]  isosorbide mononitrate (IMDUR) 30 MG 24 hr tablet Take 1 tablet (30 mg total) by mouth daily. 11/22/18  Yes Angiulli, Lavon Paganini, PA-C  lidocaine (LMX) 4 % cream Apply topically 4 (four) times daily. Apply to affected areas as directed 11/22/18  Yes Angiulli, Lavon Paganini, PA-C  methocarbamol (ROBAXIN) 500 MG tablet Take 1 tablet (500 mg total) by mouth every 6 (six) hours as needed for muscle spasms. 12/20/18  Yes Libby Maw, MD  Multiple Vitamin (MULTIVITAMIN) tablet Take 1 tablet by mouth daily.   Yes [provider]  multivitamin-lutein (OCUVITE-LUTEIN) CAPS capsule Take 1 capsule by mouth daily.   Yes [provider]  omeprazole (PRILOSEC) 20 MG capsule Take  1 capsule (20 mg total) by mouth daily. 01/17/19  Yes Noralyn Pick, NP  ondansetron (ZOFRAN) 4 MG tablet Take 1 tablet (4 mg total) by mouth every 8 (eight) hours as needed for nausea  or vomiting. 12/07/18  Yes Meredith Staggers, MD  pyridostigmine (MESTINON) 60 MG tablet Take 60 mg by mouth daily.   Yes [provider]  rosuvastatin (CRESTOR) 10 MG tablet Take 5 mg by mouth daily.   Yes [provider]  timolol (BETIMOL) 0.5 % ophthalmic solution Place 1 drop into both eyes 2 (two) times daily.   Yes [provider]    Scheduled Meds:  brimonidine  1 drop Both Eyes BID   gabapentin  300 mg Oral TID   [START ON 01/20/2019] isosorbide mononitrate  30 mg Oral Daily   [START ON 01/20/2019] pyridostigmine  60 mg Oral Daily   [START ON 01/20/2019] rosuvastatin  5 mg Oral Daily   timolol  1 drop Both Eyes BID   Infusions:  sodium chloride 75 mL/hr at 02/02/2019 1225   [START ON 01/20/2019] ceFEPime (MAXIPIME) IV     PRN Meds: acetaminophen   Allergies as of 01/10/2019 - Review Complete 01/24/2019  Allergen Reaction Noted   Other Other (See Comments) 12/23/2017   Phenergan [promethazine hcl] Swelling and Other (See Comments) 06/20/2015   Zofran Alvis Lemmings hcl] Swelling and Other (See Comments) 06/28/2015   Sulfamethoxazole Rash and Itching 07/12/2006    Family History  Problem Relation Age of Onset   Heart disease Mother    Heart attack Mother    Stroke Mother    Heart disease Father    Heart attack Father    Stroke Father    Breast cancer Sister        Twin    Heart attack Sister    Dementia Sister    Heart disease Sister    Heart attack Sister    Clotting disorder Sister    Heart attack Sister    Colon cancer Neg Hx     Social History   Socioeconomic History   Marital status: Married    Spouse name: Hassan Rowan   Number of children: 2   Years of education: 1-College   Highest education level: Not on  file  Occupational History   Occupation: retired    Fish farm manager: RETIRED    Comment: Retired  Tobacco Use   Smoking status: Never Smoker   Smokeless tobacco: Never Used  Substance and Sexual Activity   Alcohol use: No    Alcohol/week: 0.0 standard drinks   Drug use: No   Sexual activity: Not Currently  Other Topics Concern   Not on file  Social History Narrative   Lives at home w/ his wife   Patient is right handed.   Patient has 1-2 cups of caffeine daily.   Social Determinants of Health   Financial Resource Strain:    Difficulty of Paying Living Expenses: Not on file  Food Insecurity:    Worried About Charity fundraiser in the Last Year: Not on file   [employer] of Food in the Last Year: Not on file  Transportation Needs:    Lack of Transportation (Medical): Not on file   Lack of Transportation (Non-Medical): Not on file  Physical Activity:    Days of Exercise per Week: Not on file   Minutes of Exercise per Session: Not on file  Stress:    Feeling of Stress : Not on file  Social Connections: Unknown   Frequency of Communication with Friends and Family: Patient refused   Frequency of Social Gatherings with Friends and Family: Patient refused   Attends Religious Services: Patient refused   Marine scientist or Organizations:  Patient refused   Attends Archivist Meetings: Patient refused   Marital Status: Patient refused  Intimate Partner Violence: Unknown   Fear of Current or Ex-Partner: Patient refused   Emotionally Abused: Patient refused   Physically Abused: Patient refused   Sexually Abused: Patient refused    REVIEW OF SYSTEMS: Constitutional: Feels weak. ENT:  No nose bleeds Pulm:   Coughing up clear, thick sputum in addition to clearing the same from his throat. CV:  No palpitations, no LE edema.  No chest pain, no angina. GU:  No hematuria, no frequency, no oliguria or dark-colored urine. GI: See HPI. Heme: Denies  unusual or excessive bleeding or bruising. Transfusions: None Neuro:  No headaches, no peripheral tingling or numbness Derm:  No itching, no rash or sores.  Endocrine:  No sweats or chills.  No polyuria or dysuria Immunization: Reviewed.  Flu shot is current. Travel:  None beyond local counties in last few months.    PHYSICAL EXAM: Vital signs in last 24 hours: Vitals:   01/28/2019 1015 01/17/2019 1030  BP: (!) 122/42 (!) 114/43  Pulse: 78 95  Resp: 12 20  Temp:    SpO2: 100% 100%   Wt Readings from Last 3 Encounters:  01/17/19 67.6 kg  12/16/18 68 kg  12/07/18 68 kg    General:  Ill-appearing, tired but alert, obese WM Head: No facial asymmetry or swelling.  No signs of head trauma. Eyes: Sclera injected, erythematous.  No conjunctival pallor. Ears: Slight HOH. Nose: No discharge, no congestion. Mouth: Tongue midline.  Mucosa pink, moist, clear. Neck: No JVD, no masses, no thyromegaly, no bruits Lungs: No labored breathing, no coughing during the interview.   Diminished breath sounds with some crackles in the right lower fields. Heart: RRR.  No MRG.  S1, S2 present Abdomen: Soft.  Slightly obese, not tender, not distended.  No HSM, masses, bruits, hernias.  Bowel sounds active..   Rectal: Deferred Musc/Skeltl: Right AKA.  Extremities: No swelling in the left lower extremity. Neurologic: Alert.  Slightly hard of hearing.  Oriented x3.  Moves all 4 limbs, no tremor, no gross deficits. Skin: A few bruises on his arms, not prominent. Tattoos: None observed Nodes: No cervical adenopathy Psych: Calm, cooperative, pleasant, fluid speech.  Intake/Output from previous day: No intake/output data recorded. Intake/Output this shift: No intake/output data recorded.  LAB RESULTS: Recent Labs    02/09/2019 0800  WBC 10.9*  HGB 10.2*  HCT 31.0*  PLT 234   BMET Lab Results  Component Value Date   NA 138 01/31/2019   NA 134 (L) 12/16/2018   NA 139 11/22/2018   K 3.0 (L)  01/24/2019   K 3.5 12/16/2018   K 3.9 11/22/2018   CL 100 02/06/2019   CL 91 (L) 12/16/2018   CL 99 11/22/2018   CO2 28 01/28/2019   CO2 31 12/16/2018   CO2 26 11/22/2018   GLUCOSE 98 01/15/2019   GLUCOSE 123 (H) 12/16/2018   GLUCOSE 134 (H) 11/22/2018   BUN 9 01/26/2019   BUN 6 (L) 12/16/2018   BUN 50 (H) 11/22/2018   CREATININE 1.84 (H) 01/12/2019   CREATININE 1.59 (H) 12/16/2018   CREATININE 1.61 (H) 11/22/2018   CALCIUM 8.5 (L) 02/08/2019   CALCIUM 8.9 12/16/2018   CALCIUM 8.2 (L) 11/22/2018   LFT Recent Labs    02/02/2019 0800  PROT 5.2*  ALBUMIN 3.2*  AST 45*  ALT 18  ALKPHOS 67  BILITOT 1.3*   PT/INR Lab  Results  Component Value Date   INR 1.2 01/15/2019   INR 1.1 11/07/2018   INR 1.0 09/23/2018   Hepatitis Panel No results for input(s): HEPBSAG, HCVAB, HEPAIGM, HEPBIGM in the last 72 hours. C-Diff No components found for: CDIFF Lipase     Component Value Date/Time   LIPASE 26 02/05/2019 0800    Drugs of Abuse     Component Value Date/Time   LABOPIA NONE DETECTED 07/19/2017 1734   COCAINSCRNUR NONE DETECTED 07/19/2017 1734   LABBENZ NONE DETECTED 07/19/2017 1734   AMPHETMU NONE DETECTED 07/19/2017 1734   THCU NONE DETECTED 07/19/2017 1734   LABBARB NONE DETECTED 07/19/2017 1734     RADIOLOGY STUDIES: DG Abdomen 1 View  Result Date: 02/05/2019 CLINICAL DATA:  Abdominal swelling, fever EXAM: ABDOMEN - 1 VIEW COMPARISON:  06/17/2015 FINDINGS: Nonspecific bowel gas pattern without evidence of obstruction. No gross free intraperitoneal air. No radio-opaque calculi or other significant radiographic abnormality are seen. IMPRESSION: Nonobstructive bowel gas pattern. Electronically Signed   By: Davina Poke M.D.   On: 02/04/2019 09:48   DG Chest Port 1 View  Result Date: 02/06/2019 CLINICAL DATA:  Assess for obstruction. Fever and weakness with nausea and vomiting 2 weeks. EXAM: PORTABLE CHEST 1 VIEW COMPARISON:  10/30/2018 FINDINGS: Lungs are  adequately inflated with airspace consolidation over the right base which may be due to pneumonia or atelectasis. No evidence of effusion. Borderline stable cardiomegaly. Remainder of the exam is unchanged. IMPRESSION: Opacification over the right base which may be due to atelectasis or pneumonia. Electronically Signed   By: Marin Olp M.D.   On: 01/30/2019 09:44    IMPRESSION:   *   Dysphagia. EGD several years ago failed to demonstrate esophageal stricture but there was esophageal dysmotility on barium esophagram. Dysphagia started occurring a month and a half after his cervical spine surgery and CT scan of the neck 6 days ago revealed sialolithiasis and chronic right maxillary sinusitis but no swelling in the surgical region to suggest postsurgical cause to the dysphagia.  *   Anemia, chronic, normocytic in patient with chronic myeloid leukemia.  *     History DVT, carotid artery disease, stroke.  On chronic Eliquis.  Last dose of this was taken in the morning of 12/9.  *    Right basilar pneumonia likely sequela of aspiration.  *    Myasthenia gravis, on Mestinon.  Raises question of neurogenic dysphagia  *     COVID-19 negative.   PLAN:     *    EGD with possible esophageal dilatation, question timing.  May need a couple of days of antibiotics before this. Consider barium esophagram in the interim, however with suspected aspiration this may be risky.  *    Continue antibiotics, so far he has received Maxipime, metronidazole, vancomycin   Azucena Freed  02/03/2019, 12:42 PM Phone [phone]

## 2019-01-19 NOTE — ED Notes (Signed)
Dr. Lorin Mercy made aware that pcr covid/flu are negative, this RN requested that PUI precautions be removed

## 2019-01-19 NOTE — H&P (Signed)
History and Physical    [name] [id number] DOB: 04/15/1940 DOA: 01/12/2019  PCP: Libby Maw, MD Consultants:  Havery Moros - GI; Ronnald Ramp - neurosurgery; Burt Knack - cardiology Patient coming from:  Home - lives with [name]; NOK: [name], [phone]  Chief Complaint: Fever  HPI: [name] is a 78 y.o. male with medical history significant of CVA; PE; pituitary tumor; PVD s/p R AKA; myasthenia gravis; HLD; glaucoma; HTN; DVT; DM; CAD; CKD; and chronic back pain presenting with fever.  He reports he was vomiting all night and he couldn't sleep.  He developed fever.  He hasn't been able to swallow for weeks, there is a problem with his swallow and he is hoarse.  Started with food dysphagia, some pills he can no longer take, now having liquid dysphagia.  He vomits with small amounts of eating with thick white mucus.  He got a bit less responsive overnight.  He feels better already with the IVF.  Dysphagia started about 2 months after neck surgery, and this appears to unrelated.  He was scheduled for a swllow test next Wednesday and EGD 12/30.  They were scheduled to see Dr. Lucia Gaskins tomorrow.    ED Course:  Fever since last night with n/v.  Sats 90 with tachypnea, now on 2L.  Given broad spectrum antibiotics.  Has had dysphagia for several weeks following cervical fusion (recent neck CT negative).  Saw GI - planned for outpatient EGD.  Has RLL PNA, concern for aspiration PNA.   Not given 30 cc/kg bolus, no hypotension, normal lactate.  BD COVID negative, will order Cepheid COVID.  Review of Systems: As per HPI; otherwise review of systems reviewed and negative.   Ambulatory Status:  Wheelchair bound  Past Medical History:  Diagnosis Date  . Bruises easily    d/t being on Eliquis  . Carotid artery disease ([place])    a. Duplex 05/2014: 123456 RICA, 123456 LICA, elevated velocities in right subclavian artery, normal left subclavian artery..  . Chronic back pain    HNP  . CKD (chronic  kidney disease)   . Claustrophobia    takes Ativan as needed  . 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 Coronary atherosclerosis of unspecified type of vessel, native or graft    a. cath in 2003 showed 10-20% LM, scattered 20% prox-mid LAD, 30% more distal LAD, 50-60% stenosis of LAD towards apex, 20% Cx, 30% dRCA, focal 95% stenosis in smaller of 2 branches of PDA, EF 60-65%.  . Diabetes mellitus ([place])    no longer- cancxelled by Dr Hale Bogus  . Diarrhea    takes Imodium daily as needed  . Diverticulosis of colon (without mention of hemorrhage)   . DJD (degenerative joint disease)   . DVT (deep venous thrombosis) ([place]) 07/2011  . Esophageal stricture   . Essential hypertension    takes Imdur and Lisinopril daily  . Gastric polyps    benign  . GERD (gastroesophageal reflux disease)    takes Nexium daily  . Glaucoma    uses eye drops  . History of blood transfusion `   leg amputation  . History of bronchitis    not sure when the last time  . History of colon polyps    benign  . History of kidney stones    litrotrispspy  . Insomnia    takes Melatonin nightly as needed  . Mixed hyperlipidemia    takes Crestor daily  . Myasthenia gravis ([place])  uses prednisone  . Osteoporosis 2016  . Peripheral edema    takes Lasix daily  . Peripheral vascular disease ([place])   . Pituitary tumor    checks Dr[facility]Walden at [facility] checks it every Jan   . PONV (postoperative nausea and vomiting)   . Ptosis   . Pulmonary embolus ([place]) 2012  . Stroke ([place]) 06/2015   x 3- last one 2018- balance issuses    Past Surgical History:  Procedure Laterality Date  . ABDOMINAL AORTOGRAM W/LOWER EXTREMITY N/A 11/18/2016   Procedure: ABDOMINAL AORTOGRAM W/LOWER EXTREMITY;  Surgeon: [name], MD;  Location: [place] [facility];  Service: Cardiovascular;  Laterality: N/A;  . AMPUTATION Right 02/07/2018   Procedure: right leg AMPUTATION ABOVE KNEE;  Surgeon: [name], MD;  Location: [place];  Service: Vascular;  Laterality: Right;  . ANTERIOR CERVICAL DECOMP/DISCECTOMY FUSION N/A 11/09/2018   Procedure: CERVICAL THREE-FOUR ANTERIOR CERVICAL DECOMPRESSION/DISCECTOMY FUSION WITH CERVICAL FIVE CORPECTOMY;  Surgeon: [name], MD;  Location: [place];  Service: Neurosurgery;  Laterality: N/A;  . AORTOGRAM Right 12/23/2017   Procedure: AORTOGRAM, RIGHT LOWER EXTREMITY ANGIOGRAM, RIGHT SUPERFICIAL FEMORAL ARTERY ANGIOPLASTY WITH STENT, RIGHT POPLITEAL ARTERY ANGIOPLASTY WITH STENT, LEFT GROIN CUTDOWN AND LEFT FEMORAL ARTERY REPAIR;  Surgeon: [name], MD;  Location: [facility];  Service: Vascular;  Laterality: Right;  . BACK SURGERY     x2, lumbar and cervical  . CARDIAC CATHETERIZATION    . CARPAL TUNNEL RELEASE Left 09/23/2018   Procedure: CARPAL TUNNEL RELEASE;  Surgeon: [name], MD;  Location: [facility];  Service: Neurosurgery;  Laterality: Left;  CARPAL TUNNEL RELEASE  . CATARACT EXTRACTION Bilateral 12/12  . COLONOSCOPY    . ESOPHAGOGASTRODUODENOSCOPY ENDOSCOPY  04/2015  . IR GENERIC HISTORICAL  03/03/2016   IR IVC FILTER PLMT / S&I [name] GUID/MOD SED 03/03/2016 [name], MD [facility]  . IR GENERIC HISTORICAL  04/09/2016   IR RADIOLOGIST EVAL & MGMT 04/09/2016 [name], MD [facility] RAD  . IR GENERIC HISTORICAL  04/30/2016   IR IVC FILTER RETRIEVAL / S&I [name] GUID/MOD SED 04/30/2016 [name], MD [facility]-INTER[facility] RAD  . LITHOTRIPSY    . LUMBAR LAMINECTOMY/DECOMPRESSION MICRODISCECTOMY Right 09/14/2012   Procedure: Right Lumbar three-four, four-five, Lumbar five-Sacral one decompressive laminectomy;  Surgeon: [name], MD;  Location: [facility];  Service: Neurosurgery;  Laterality: Right;  . LUMBAR LAMINECTOMY/DECOMPRESSION MICRODISCECTOMY Left 03/11/2016   Procedure: Left Lumbar Two-ThreeMicrodiscectomy;  Surgeon: [name], MD;  Location: [place] [facility];  Service: Neurosurgery;  Laterality: Left;  . PERIPHERAL VASCULAR  INTERVENTION  11/18/2016   Procedure: PERIPHERAL VASCULAR INTERVENTION;  Surgeon: [name], MD;  Location: [facility];  Service: Cardiovascular;;  Left popliteal  . SHOULDER SURGERY Left 05/07/2009  . TONSILLECTOMY    . ULNAR NERVE TRANSPOSITION Left 09/23/2018   Procedure: ULNAR NERVE DECOMPRESSION/TRANSPOSITION;  Surgeon: [name], MD;  Location: [place];  Service: Neurosurgery;  Laterality: Left;  ULNAR NERVE DECOMPRESSION/TRANSPOSITION    Social History   Socioeconomic History  . Marital status: Married    Spouse name: Hassan Rowan  . Number of children: 2  . Years of education: 1-College  . Highest education level: Not on file  Occupational History  . Occupation: retired    Fish farm manager: RETIRED    Comment: Retired  Tobacco Use  . Smoking status: Never Smoker  . Smokeless tobacco: Never Used  Substance and Sexual Activity  . Alcohol use: No    Alcohol/week:  0.0 standard drinks  . Drug use: No  . Sexual activity: Not Currently  Other Topics Concern  . Not on file  Social History Narrative   Lives at home w/ his [name]   Patient is right handed.   Patient has 1-2 cups of caffeine daily.   Social Determinants of Health   Financial Resource Strain:   . Difficulty of Paying Living Expenses: Not on file  Food Insecurity:   . Worried About Charity fundraiser in the Last Year: Not on file  . Ran Out of Food in the Last Year: Not on file  Transportation Needs:   . Lack of Transportation (Medical): Not on file  . Lack of Transportation (Non-Medical): Not on file  Physical Activity:   . Days of Exercise per Week: Not on file  . Minutes of Exercise per Session: Not on file  Stress:   . Feeling of Stress : Not on file  Social Connections: Unknown  . Frequency of Communication with Friends and Family: Patient refused  . Frequency of Social Gatherings with Friends and Family: Patient refused  . Attends Religious Services: Patient refused  . Active Member of Clubs  or Organizations: Patient refused  . Attends Archivist Meetings: Patient refused  . Marital Status: Patient refused  Intimate Partner Violence: Unknown  . Fear of Current or Ex-Partner: Patient refused  . Emotionally Abused: Patient refused  . Physically Abused: Patient refused  . Sexually Abused: Patient refused    Allergies  Allergen Reactions  . Other Other (See Comments)    Regarding scans, the PATIENT IS CLAUSTROPHOBIC!!!  . Phenergan [Promethazine Hcl] Swelling and Other (See Comments)    Cannot be combined with Zofran because swelling of the eyes resulted and patient became very restless-  . Zofran [Ondansetron Hcl] Swelling and Other (See Comments)    Cannot be combined with Phenergan because swelling of the eyes resulted and patient became very restless-  . Sulfamethoxazole Rash and Itching    Family History  Problem Relation Age of Onset  . Heart disease Mother   . Heart attack Mother   . Stroke Mother   . Heart disease Father   . Heart attack Father   . Stroke Father   . Breast cancer Sister        Twin   . Heart attack Sister   . Dementia Sister   . Heart disease Sister   . Heart attack Sister   . Clotting disorder Sister   . Heart attack Sister   . Colon cancer Neg Hx     Prior to Admission medications   Medication Sig Start Date End Date Taking? Authorizing Provider  acetaminophen (TYLENOL) 325 MG tablet Take 2 tablets (650 mg total) by mouth every 4 (four) hours as needed for mild pain ((score 1 to 3) or temp > 100.5). 11/22/18   Angiulli, Lavon Paganini, PA-C  apixaban (ELIQUIS) 2.5 MG TABS tablet Take 1 tablet (2.5 mg total) by mouth 2 (two) times daily. Patient taking differently: Take 2.5 mg by mouth daily.  11/22/18   Angiulli, Lavon Paganini, PA-C  brimonidine (ALPHAGAN) 0.2 % ophthalmic solution Place 1 drop into both eyes 2 (two) times daily.    [provider]  cholecalciferol (VITAMIN D) 1000 units tablet Take 1,000 Units by mouth daily.      [provider]  diclofenac sodium (VOLTAREN) 1 % GEL Apply 2 g topically 3 (three) times daily as needed. 11/22/18   Cameron, Lavon Paganini,  PA-C  diphenoxylate-atropine (LOMOTIL) 2.5-0.025 MG tablet Take 1 tablet by mouth every 6 (six) hours as needed for diarrhea or loose stools. 05/19/18   Armbruster, Carlota Raspberry, MD  furosemide (LASIX) 40 MG tablet Take 1 tablet (40 mg total) by mouth daily. 11/23/18   Angiulli, Lavon Paganini, PA-C  gabapentin (NEURONTIN) 300 MG capsule Take 1 capsule (300 mg total) by mouth 3 (three) times daily. 11/22/18   Angiulli, Lavon Paganini, PA-C  imatinib (GLEEVEC) 100 MG tablet Take 300 mg by mouth daily with lunch. Take with noon meal and a large glass of water.Caution:Chemotherapy    [provider]  isosorbide mononitrate (IMDUR) 30 MG 24 hr tablet Take 1 tablet (30 mg total) by mouth daily. 11/22/18   Angiulli, Lavon Paganini, PA-C  lidocaine (LMX) 4 % cream Apply topically 4 (four) times daily. Apply to affected areas as directed 11/22/18   Angiulli, Lavon Paganini, PA-C  methocarbamol (ROBAXIN) 500 MG tablet Take 1 tablet (500 mg total) by mouth every 6 (six) hours as needed for muscle spasms. 12/20/18   Libby Maw, MD  Multiple Vitamin (MULTIVITAMIN) tablet Take 1 tablet by mouth daily.    [provider]  multivitamin-lutein (OCUVITE-LUTEIN) CAPS capsule Take 1 capsule by mouth daily.    [provider]  omeprazole (PRILOSEC) 20 MG capsule Take 1 capsule (20 mg total) by mouth daily. 01/17/19   Noralyn Pick, NP  ondansetron (ZOFRAN) 4 MG tablet Take 1 tablet (4 mg total) by mouth every 8 (eight) hours as needed for nausea or vomiting. 12/07/18   Meredith Staggers, MD  oxyCODONE (OXY IR/ROXICODONE) 5 MG immediate release tablet Take 1 tablet (5 mg total) by mouth every 3 (three) hours as needed for moderate pain ((score 4 to 6)). 11/22/18   Angiulli, Lavon Paganini, PA-C  pyridostigmine (MESTINON) 60 MG tablet Take 60 mg by mouth daily.     [provider]  rosuvastatin (CRESTOR) 10 MG tablet Take 5 mg by mouth daily.    [provider]  senna (SENOKOT) 8.6 MG TABS tablet Take 1 tablet (8.6 mg total) by mouth 2 (two) times daily. 11/22/18   Angiulli, Lavon Paganini, PA-C  timolol (BETIMOL) 0.5 % ophthalmic solution Place 1 drop into both eyes 2 (two) times daily.    [provider]    Physical Exam: Vitals:   01/15/2019 1230 01/17/2019 1530 01/27/2019 1600 01/27/2019 1630  BP: (!) 100/35 (!) 120/43 (!) 130/49 (!) 101/47  Pulse: 87 85 88 82  Resp:  12 19 18   Temp:      TempSrc:      SpO2: 100% (!) 85% 92% 95%     . General:  Appears calm and comfortable and is NAD . Eyes:  ?chronic visual impairment, mild chronic lid edema with lid lag . ENT: hard of hearing, normal lips & tongue, mmm . Neck:  no LAD, masses or thyromegaly . Cardiovascular:  RR with mild tachycardia, no m/r/g. No LE edema.  Marland Kitchen Respiratory:   CTA bilaterally with no wheezes/rales/rhonchi.  Normal respiratory effort. . Abdomen:  soft, NT, ND, NABS . Back:   normal alignment, no CVAT . Skin:  no rash or induration seen on limited exam . Musculoskeletal:  grossly normal tone BUE/BLE, s/p R AKA, good ROM, no bony abnormality . Psychiatric:  grossly normal mood and affect, speech fluent and appropriate, AOx3 . Neurologic:  CN 2-12 grossly intact, moves all extremities in coordinated fashion, sensation intact    Radiological Exams on  Admission: DG Abdomen 1 View  Result Date: 01/23/2019 CLINICAL DATA:  Abdominal swelling, fever EXAM: ABDOMEN - 1 VIEW COMPARISON:  06/17/2015 FINDINGS: Nonspecific bowel gas pattern without evidence of obstruction. No gross free intraperitoneal air. No radio-opaque calculi or other significant radiographic abnormality are seen. IMPRESSION: Nonobstructive bowel gas pattern. Electronically Signed   By: Davina Poke M.D.   On: 02/04/2019 09:48   DG Chest Port 1 View  Result Date: 02/02/2019 CLINICAL DATA:   Assess for obstruction. Fever and weakness with nausea and vomiting 2 weeks. EXAM: PORTABLE CHEST 1 VIEW COMPARISON:  10/30/2018 FINDINGS: Lungs are adequately inflated with airspace consolidation over the right base which may be due to pneumonia or atelectasis. No evidence of effusion. Borderline stable cardiomegaly. Remainder of the exam is unchanged. IMPRESSION: Opacification over the right base which may be due to atelectasis or pneumonia. Electronically Signed   By: Marin Olp M.D.   On: 01/26/2019 09:44    EKG: Independently reviewed.  Sinus tachycardia with rate 108; nonspecific ST changes with no evidence of acute ischemia   Labs on Admission: I have personally reviewed the available labs and imaging studies at the time of the admission.  Pertinent labs:   K+ 3.0 BUN 9/Creatinine 1.84/GFR 34 Albumin 3.2 AST 45/ALT 18 LDH 221 Ferritin 255 CRP 1.2 Lactate 1.8 Procalcitonin 2.90 WBC 10.9 Hgb 10.2 D-dimer 2.69 Fibrinogen 358 INR 1.2 COVID negative  Assessment/Plan Principal Problem:   Sepsis due to pneumonia [facility]) Active Problems:   Chronic myeloid leukemia in remission ([place])   HYPERLIPIDEMIA, MIXED   Diabetes mellitus type 2, controlled ([place])   History of pulmonary embolism   Chronic anticoagulation   Myasthenia gravis ([place])   Benign essential HTN   Dysphagia    Sepsis -SIRS criteria in this patient includes: Leukocytosis, tachypnea, hypoxia, tachycardia  -Patient has evidence of acute organ failure with borderline hypotension -While awaiting blood cultures, this appears to be a preseptic condition. -Sepsis protocol initiated -Suspected source is aspiration PNA associated with dysphagia -Blood and urine cultures pending -Will admit to telemetry -Treat with IV Cefepime for aspiration PNA -Lower respiratory tract procalcitonin level is 2.9.  Antibiotics would not be indicated for PCT <0.1 and probably should not be used for < 0.25.  >0.5 indicates infection and  >>0.5 indicates more serious disease.  As the procalcitonin level normalizes, it will be reasonable to consider de-escalation of antibiotic coverage. -COVID/respiratory panel negative  Dysphagia -Patient with progressive dysphagia - initially with solids and progressive to liquids -has seen GI outpatient, plan for EGD on 12/30 -Appears to need treatment sooner, likely dilation -Will consult GI   CML -Hold Imantinib for now (large pill taken with a large glass of water)  CAD -Continue Imdur, Crestor  DM -He does not appear to be taking medication for this issue at this time -Recent A1c was 5.8 on 9/28  H/o PE on AC -Hold Eliquis -Will cover with heparin as per pharmacy for now  HTN -He does not appear to be taking medications for this issue at this time  Myasthenia gravis -Continue physostigmine   DVT prophylaxis:  Heparin Code Status:  DNR - confirmed with patient/family Family Communication: [name] present throughout evaluation  Disposition Plan:  Home once clinically improved Consults called: GI  Admission status: Admit - It is my clinical opinion that admission to INPATIENT is reasonable and necessary because of the expectation that this patient will require hospital care that crosses at least 2 midnights to treat this condition based  on the medical complexity of the problems presented.  Given the aforementioned information, the predictability of an adverse outcome is felt to be significant.     Karmen Bongo MD [facility]   How to contact the [facility] Attending or Consulting provider Victoria Vera or covering provider during after hours [place], for this patient?  1. Check the care team in [facility] and look for a) attending/consulting [facility] provider listed and b) the [facility] team listed 2. Log into [email or web address] and use [place]'s universal password to access. If you do not have the password, please contact the hospital operator. 3. Locate the [facility] provider you are looking for  under [facility] and page to a number that you can be directly reached. 4. If you still have difficulty reaching the provider, please page the [facility] (Director on Call) for the Hospitalists listed on amion for assistance.   01/14/2019, 6:48 PM

## 2019-01-19 NOTE — ED Notes (Signed)
This RN attempted to start a second IV on patient and obtain blood cultures, unable to site IV but able to get 73ml for 1 culture bottle.

## 2019-01-19 NOTE — ED Notes (Signed)
ED Provider at bedside. 

## 2019-01-19 NOTE — ED Notes (Signed)
Dr Rosendo Gros at bedside

## 2019-01-19 NOTE — Progress Notes (Signed)
Pharmacy Antibiotic Note  [name] is a 78 y.o. male admitted on 01/16/2019 with sepsis.  Pharmacy has been consulted for vancomycin/cefepime dosing. Afebrile, WBC 10.9, LA 1.8. SCr 1.84 on admit.  Plan: Cefepime 2g IV x 1; then 2g IV q24h Vancomycin 1500mg  IV x1; then Vancomycin 1500 mg IV Q 48 hrs. Goal AUC 400-550. Expected AUC: 543 SCr used: 1.84 Flagyl 500mg  IV x 1 per EDP - f/u if to continue Monitor clinical progress, c/s, renal function F/u de-escalation plan/LOT, vancomycin levels as indicated      Temp (24hrs), Avg:99.8 F (37.7 C), Min:99.8 F (37.7 C), Max:99.8 F (37.7 C)  No results for input(s): WBC, CREATININE, LATICACIDVEN, VANCOTROUGH, VANCOPEAK, VANCORANDOM, GENTTROUGH, GENTPEAK, GENTRANDOM, TOBRATROUGH, TOBRAPEAK, TOBRARND, AMIKACINPEAK, AMIKACINTROU, AMIKACIN in the last 168 hours.  CrCl cannot be calculated (Patient's most recent lab result is older than the maximum 21 days allowed.).    Allergies  Allergen Reactions  . Other Other (See Comments)    Regarding scans, the PATIENT IS CLAUSTROPHOBIC!!!  . Phenergan [Promethazine Hcl] Swelling and Other (See Comments)    Cannot be combined with Zofran because swelling of the eyes resulted and patient became very restless-  . Zofran [Ondansetron Hcl] Swelling and Other (See Comments)    Cannot be combined with Phenergan because swelling of the eyes resulted and patient became very restless-  . Sulfamethoxazole Rash and Itching    Antimicrobials this admission: 12/10 vancomycin >>  12/10 cefepime >>   Dose adjustments this admission:   Microbiology results:   Elicia Lamp, PharmD, BCPS Clinical Pharmacist 02/09/2019 7:57 AM

## 2019-01-19 NOTE — Progress Notes (Signed)
[place] for heparin Indication: history atrial fibrillation, PE/DVT  Heparin Dosing Weight: 67.6 kg  Labs: Recent Labs    01/20/2019 0800  HGB 10.2*  HCT 31.0*  PLT 234  APTT 30  LABPROT 15.2  INR 1.2  CREATININE 1.84*    Assessment: 78 yom on apixaban PTA for afib presenting with fever, tachycardia, hypoxia with new oxygen requirement. Pharmacy consulted to dose heparin. Hg 10.2, plt wnl, d-dimer 2.69. No active bleed issues documented. Last dose of apixaban reported 01/18/19 at 1200 - will start heparin 12 hours after last dose per protocol.  Goal of Therapy:  Heparin level 0.3-0.7 units/ml aPTT 66-102 seconds Monitor platelets by anticoagulation protocol: Yes   Plan:  Baseline aPTT/heparin level No bolus. Start heparin at 1000 units/h at midnight (12hrs after last apixaban dose) 8h heparin level Monitor daily heparin level and CBC, s/sx bleeding   Elicia Lamp, PharmD, BCPS Clinical Pharmacist 02/02/2019 1:07 PM

## 2019-01-19 NOTE — ED Triage Notes (Signed)
Pt arrive via [facility] em[facility], reports vomiting and nausea x2 weeks, pt had throat surgery a few months ago which has made it difficult for him to eat. Reports fever of 102.4 last night. Ems temp 101.9, 142/64 after 615ml fluids, HR 110, 90% on room air, 97% on 2L n.c. cbg 143.given 4mg  zofran en route. A/ox4.

## 2019-01-19 NOTE — ED Notes (Signed)
Patient remains on 2L O2 via n.c. due to sats of 90-91% on room air, O2 sats 100% on 2L supplemental O2

## 2019-01-19 NOTE — ED Notes (Signed)
Family at bedside. 

## 2019-01-19 NOTE — ED Notes (Signed)
Patient requesting something to drink, however, this RN spoke with Benedetto Goad, PA who advised that patient probably has aspiration pneumonia and will need Speech therapy to evaluate his swallowing further prior to him eating or drinking anything.

## 2019-01-20 ENCOUNTER — Ambulatory Visit (INDEPENDENT_AMBULATORY_CARE_PROVIDER_SITE_OTHER): Payer: Medicare Other | Admitting: Otolaryngology

## 2019-01-20 DIAGNOSIS — R0902 Hypoxemia: Secondary | ICD-10-CM

## 2019-01-20 DIAGNOSIS — E1159 Type 2 diabetes mellitus with other circulatory complications: Secondary | ICD-10-CM

## 2019-01-20 LAB — CBC WITH DIFFERENTIAL/PLATELET
Abs Immature Granulocytes: 0.03 10*3/uL (ref 0.00–0.07)
Basophils Absolute: 0 10*3/uL (ref 0.0–0.1)
Basophils Relative: 0 %
Eosinophils Absolute: 0.2 10*3/uL (ref 0.0–0.5)
Eosinophils Relative: 2 %
HCT: 27.8 % — ABNORMAL LOW (ref 39.0–52.0)
Hemoglobin: 9.1 g/dL — ABNORMAL LOW (ref 13.0–17.0)
Immature Granulocytes: 0 %
Lymphocytes Relative: 16 %
Lymphs Abs: 1.6 10*3/uL (ref 0.7–4.0)
MCH: 31.7 pg (ref 26.0–34.0)
MCHC: 32.7 g/dL (ref 30.0–36.0)
MCV: 96.9 fL (ref 80.0–100.0)
Monocytes Absolute: 1.1 10*3/uL — ABNORMAL HIGH (ref 0.1–1.0)
Monocytes Relative: 12 %
Neutro Abs: 6.5 10*3/uL (ref 1.7–7.7)
Neutrophils Relative %: 70 %
Platelets: 198 10*3/uL (ref 150–400)
RBC: 2.87 MIL/uL — ABNORMAL LOW (ref 4.22–5.81)
RDW: 14.4 % (ref 11.5–15.5)
WBC: 9.5 10*3/uL (ref 4.0–10.5)
nRBC: 0 % (ref 0.0–0.2)

## 2019-01-20 LAB — BASIC METABOLIC PANEL
Anion gap: 9 (ref 5–15)
BUN: 11 mg/dL (ref 8–23)
CO2: 23 mmol/L (ref 22–32)
Calcium: 8 mg/dL — ABNORMAL LOW (ref 8.9–10.3)
Chloride: 108 mmol/L (ref 98–111)
Creatinine, Ser: 1.64 mg/dL — ABNORMAL HIGH (ref 0.61–1.24)
GFR calc Af Amer: 45 mL/min — ABNORMAL LOW (ref >60)
GFR calc non Af Amer: 39 mL/min — ABNORMAL LOW (ref >60)
Glucose, Bld: 90 mg/dL (ref 70–99)
Potassium: 3.4 mmol/L — ABNORMAL LOW (ref 3.5–5.1)
Sodium: 140 mmol/L (ref 135–145)

## 2019-01-20 LAB — HEPARIN LEVEL (UNFRACTIONATED): Heparin Unfractionated: 0.89 IU/mL — ABNORMAL HIGH (ref 0.30–0.70)

## 2019-01-20 LAB — APTT: aPTT: 124 seconds — ABNORMAL HIGH (ref 24–36)

## 2019-01-20 MED ORDER — SODIUM CHLORIDE 0.9 % IV SOLN
2.0000 g | Freq: Two times a day (BID) | INTRAVENOUS | Status: DC
  Administered 2019-01-20 – 2019-01-21 (×3): 2 g via INTRAVENOUS
  Filled 2019-01-20 (×3): qty 2

## 2019-01-20 NOTE — Progress Notes (Addendum)
[facility] PROGRESS NOTE  [name] [id number] DOB: 1940-04-04 DOA: 01/31/2019 PCP: Libby Maw, MD  Assessment/Plan:  [name] is a 78 y.o. male with medical history significant of CVA; PE; pituitary tumor; PVD s/p R AKA; myasthenia gravis; HLD; glaucoma; HTN; DVT; DM; CAD; CKD; and chronic back pain presenting with fever.  He reports he was vomiting all night and he couldn't sleep.  He developed fever.  He hasn't been able to swallow for weeks, there is a problem with his swallow and he is hoarse. Started with food dysphagia, some pills he can no longer take, now having liquid dysphagia.  He vomits with small amounts of eating with thick white mucus.  He got a bit less responsive overnight.  He feels better already with the IVF.  Dysphagia started about 2 months after neck surgery, and this appears to unrelated.  He was scheduled for a swllow test next Wednesday and EGD 12/30.   Probable aspiration pneumonia. hemodynamically stable. Cont iv antibiotics, pend cultures. Speech eval   Sepsis. SIRS criteria in this patient includes: Leukocytosis, tachypnea, hypoxia, tachycardia . cont as above. COVID/respiratory panel negative  Dysphagia. Patient with progressive dysphagia - initially with solids and progressive to liquids -plan for EGD per GI  CML. Hold Imantinib for now (large pill taken with a large glass of water)  CAD. Continue Imdur, Crestor  DM. He does not appear to be taking medication for this issue at this time -Recent A1c was 5.8 on 9/28  H/o PE on [facility]. Hold Eliquis. Resume post procedure. No recent h/o VTE (d/w patient, his family. Last VTE 2013)  HTN. He does not appear to be taking medications for this issue at this time  Myasthenia gravis. Continue physostigmine   Code Status: full Family Communication: d/w patient, RN (indicate person spoken with, relationship, and if by phone, the number) Disposition Plan: remains inpatient     Consultants:  GI  Procedures:  Pend EGD  Antibiotics: Anti-infectives (From admission, onward)   Start     Dose/Rate Route Frequency Ordered Stop   01/22/2019 0730  vancomycin (VANCOCIN) 1,500 mg in sodium chloride 0.9 % 500 mL IVPB  Status:  Discontinued     1,500 mg 250 mL/hr over 120 Minutes Intravenous Every 48 hours 02/02/2019 0853 01/28/2019 1221   01/20/19 1100  ceFEPIme (MAXIPIME) 2 g in sodium chloride 0.9 % 100 mL IVPB     2 g 200 mL/hr over 30 Minutes Intravenous Every 12 hours 01/20/19 1030     01/20/19 0730  ceFEPIme (MAXIPIME) 2 g in sodium chloride 0.9 % 100 mL IVPB  Status:  Discontinued     2 g 200 mL/hr over 30 Minutes Intravenous Every 24 hours 01/22/2019 0853 02/06/2019 1221   01/20/19 0730  ceFEPIme (MAXIPIME) 2 g in sodium chloride 0.9 % 100 mL IVPB  Status:  Discontinued     2 g 200 mL/hr over 30 Minutes Intravenous Every 24 hours 01/29/2019 1221 01/20/19 1030   01/23/2019 0715  vancomycin (VANCOCIN) 1,500 mg in sodium chloride 0.9 % 500 mL IVPB     1,500 mg 250 mL/hr over 120 Minutes Intravenous  Once 02/09/2019 0700 01/11/2019 1225   01/15/2019 0700  ceFEPIme (MAXIPIME) 2 g in sodium chloride 0.9 % 100 mL IVPB     2 g 200 mL/hr over 30 Minutes Intravenous  Once 02/08/2019 0656 01/24/2019 0836   01/28/2019 0700  metroNIDAZOLE (FLAGYL) IVPB 500 mg     500 mg 100 mL/hr  over 60 Minutes Intravenous  Once 01/28/2019 0656 02/07/2019 0938   02/03/2019 0700  vancomycin (VANCOCIN) IVPB 1000 mg/200 mL premix  Status:  Discontinued     1,000 mg 200 mL/hr over 60 Minutes Intravenous  Once 01/18/2019 0656 01/21/2019 0700        (indicate start date, and stop date if known)  HPI/Subjective: No distress. +cough.  Objective: Vitals:   01/20/19 0502 01/20/19 0826  BP: 133/71 (!) 121/51  Pulse: 79 73  Resp: 18   Temp: 97.6 F (36.4 C) (!) 97.5 F (36.4 C)  SpO2: 100% 99%    Intake/Output Summary (Last 24 hours) at 01/20/2019 1234 Last data filed at 01/20/2019 0500 Gross per 24 hour   Intake 1607.69 ml  Output 125 ml  Net 1482.69 ml   Filed Weights   02/07/2019 1900 01/20/19 0502  Weight: 69.7 kg 69.4 kg    Exam:   General:  No distress   Cardiovascular: s1,s2 rrr  Respiratory: diminished LL  Abdomen: soft, nt   Musculoskeletal: no edema   Data Reviewed: Basic Metabolic Panel: Recent Labs  Lab 02/04/2019 0800 01/20/19 0155  NA 138 140  K 3.0* 3.4*  CL 100 108  CO2 28 23  GLUCOSE 98 90  BUN 9 11  CREATININE 1.84* 1.64*  CALCIUM 8.5* 8.0*   Liver Function Tests: Recent Labs  Lab 01/17/2019 0800  AST 45*  ALT 18  ALKPHOS 67  BILITOT 1.3*  PROT 5.2*  ALBUMIN 3.2*   Recent Labs  Lab 01/11/2019 0800  LIPASE 26   No results for input(s): AMMONIA in the last 168 hours. CBC: Recent Labs  Lab 02/05/2019 0800 01/20/19 0155  WBC 10.9* 9.5  NEUTROABS 7.5 6.5  HGB 10.2* 9.1*  HCT 31.0* 27.8*  MCV 95.7 96.9  PLT 234 198   Cardiac Enzymes: No results for input(s): CKTOTAL, CKMB, CKMBINDEX, TROPONINI in the last 168 hours. BNP (last 3 results) No results for input(s): BNP in the last 8760 hours.  ProBNP (last 3 results) No results for input(s): PROBNP in the last 8760 hours.  CBG: Recent Labs  Lab 01/12/2019 2050  GLUCAP 89    Recent Results (from the past 240 hour(s))  Blood Culture (routine x 2)     Status: None (Preliminary result)   Collection Time: 01/30/2019  7:40 AM   Specimen: BLOOD  Result Value Ref Range Status   Specimen Description BLOOD RIGHT ANTECUBITAL  Final   Special Requests   Final    BOTTLES DRAWN AEROBIC ONLY Blood Culture adequate volume   Culture   Final    NO GROWTH 1 DAY Performed at [facility], [address], [place], [place] [address]    Report Status PENDING  Incomplete  Respiratory Panel by RT PCR (Flu A&B, Covid) - Nasopharyngeal Swab     Status: None   Collection Time: 01/14/2019 11:54 AM   Specimen: Nasopharyngeal Swab  Result Value Ref Range Status   SARS Coronavirus 2 by RT PCR NEGATIVE  NEGATIVE Final    Comment: (NOTE) SARS-CoV-2 target nucleic acids are NOT DETECTED. The SARS-CoV-2 RNA is generally detectable in upper respiratoy specimens during the acute phase of infection. The lowest concentration of SARS-CoV-2 viral copies this assay can detect is 131 copies/mL. A negative result does not preclude SARS-Cov-2 infection and should not be used as the sole basis for treatment or other patient management decisions. A negative result may occur with  improper specimen collection/handling, submission of specimen other than  nasopharyngeal swab, presence of viral mutation(s) within the areas targeted by this assay, and inadequate number of viral copies (<131 copies/mL). A negative result must be combined with clinical observations, patient history, and epidemiological information. The expected result is Negative. Fact Sheet for Patients:  PinkCheek.be Fact Sheet for Healthcare Providers:  GravelBags.it This test is not yet ap proved or cleared by the [place] FDA and  has been authorized for detection and/or diagnosis of SARS-CoV-2 by FDA under an Emergency Use Authorization (EUA). This EUA will remain  in effect (meaning this test can be used) for the duration of the COVID-19 declaration under Section 564(b)(1) of the Act, 21 U.S.C. section 360bbb-3(b)(1), unless the authorization is terminated or revoked sooner.    Influenza A by PCR NEGATIVE NEGATIVE Final   Influenza B by PCR NEGATIVE NEGATIVE Final    Comment: (NOTE) The Xpert Xpress SARS-CoV-2/FLU/RSV assay is intended as an aid in  the diagnosis of influenza from Nasopharyngeal swab specimens and  should not be used as a sole basis for treatment. Nasal washings and  aspirates are unacceptable for Xpert Xpress SARS-CoV-2/FLU/RSV  testing. Fact Sheet for Patients: PinkCheek.be Fact Sheet for Healthcare  Providers: GravelBags.it This test is not yet approved or cleared by the [place] FDA and  has been authorized for detection and/or diagnosis of SARS-CoV-2 by  FDA under an Emergency Use Authorization (EUA). This EUA will remain  in effect (meaning this test can be used) for the duration of the  Covid-19 declaration under Section 564(b)(1) of the Act, 21  U.S.C. section 360bbb-3(b)(1), unless the authorization is  terminated or revoked. Performed at [facility], [place] [address] [place], [place] [address]   Blood Culture (routine x 2)     Status: None (Preliminary result)   Collection Time: 01/30/2019  5:40 PM   Specimen: BLOOD LEFT ARM  Result Value Ref Range Status   Specimen Description BLOOD LEFT ARM  Final   Special Requests   Final    BOTTLES DRAWN AEROBIC AND ANAEROBIC Blood Culture results may not be optimal due to an inadequate volume of blood received in culture bottles   Culture   Final    NO GROWTH < 24 HOURS Performed at [facility], [place] [address], [place], [place] [address]    Report Status PENDING  Incomplete     Studies: DG Abdomen 1 View  Result Date: 01/18/2019 CLINICAL DATA:  Abdominal swelling, fever EXAM: ABDOMEN - 1 VIEW COMPARISON:  06/17/2015 FINDINGS: Nonspecific bowel gas pattern without evidence of obstruction. No gross free intraperitoneal air. No radio-opaque calculi or other significant radiographic abnormality are seen. IMPRESSION: Nonobstructive bowel gas pattern. Electronically Signed   By: Davina Poke M.D.   On: 01/17/2019 09:48   DG Chest Port 1 View  Result Date: 01/22/2019 CLINICAL DATA:  Assess for obstruction. Fever and weakness with nausea and vomiting 2 weeks. EXAM: PORTABLE CHEST 1 VIEW COMPARISON:  10/30/2018 FINDINGS: Lungs are adequately inflated with airspace consolidation over the right base which may be due to pneumonia or atelectasis. No evidence of effusion. Borderline stable  cardiomegaly. Remainder of the exam is unchanged. IMPRESSION: Opacification over the right base which may be due to atelectasis or pneumonia. Electronically Signed   By: Marin Olp M.D.   On: 01/27/2019 09:44    Scheduled Meds: . brimonidine  1 drop Both Eyes BID  . gabapentin  300 mg Oral TID  . isosorbide mononitrate  30 mg Oral Daily  .  pyridostigmine  60 mg Oral Daily  . rosuvastatin  5 mg Oral Daily  . timolol  1 drop Both Eyes BID   Continuous Infusions: . sodium chloride 75 mL/hr at 01/20/19 0045  . ceFEPime (MAXIPIME) IV 2 g (01/20/19 1033)  . heparin 850 Units/hr (01/20/19 1032)    Principal Problem:   Sepsis due to pneumonia [facility]) Active Problems:   Chronic myeloid leukemia in remission ([place])   HYPERLIPIDEMIA, MIXED   Diabetes mellitus type 2, controlled ([place])   History of pulmonary embolism   Chronic anticoagulation   Myasthenia gravis ([place])   Benign essential HTN   Dysphagia    Time spent: >25 minutes     Kinnie Feil  [facility] Pager [phone]. If 7PM-7AM, please contact night-coverage at [email or web address], password  [facility] 01/20/2019, 12:34 PM  LOS: 1 day

## 2019-01-20 NOTE — Plan of Care (Signed)
  Problem: Clinical Measurements: Goal: Respiratory complications will improve Outcome: Progressing Note: Stable on 2L/[place].  No s/s of respiratory complications noted. Goal: Cardiovascular complication will be avoided Outcome: Progressing Note: NSR on telemetry.  No s/s of cardiovascular complication noted.

## 2019-01-20 NOTE — Progress Notes (Signed)
Pharmacy Antibiotic Note  [name] is a 78 y.o. male admitted on 01/15/2019 with sepsis.  Pharmacy has been consulted for vancomycin/cefepime dosing.   On day#2 of abx. WBC normalized to 9.5, PCT 2.9, Scr improved from 1.84 to 1.64 (CrCl 34 mL/min). Vancomycin and metronidazole discontinued by MD due to source thought likely aspiration PNA associated with dysphagia - plan to continue with cefepime. Cx still pending.   Plan: Cefepime 2g IV q12 hr Monitor clinical progress, c/s, renal function F/u de-escalation plan/LOT >> could consider de-escalation to ceftriaxone   Height: 5\' 8"  (172.7 cm) Weight: 153 lb (69.4 kg) IBW/kg (Calculated) : 68.4  Temp (24hrs), Avg:98.2 F (36.8 C), Min:97.5 F (36.4 C), Max:98.8 F (37.1 C)  Recent Labs  Lab 01/29/2019 0800 01/20/19 0155  WBC 10.9* 9.5  CREATININE 1.84* 1.64*  LATICACIDVEN 1.8  --     Estimated Creatinine Clearance: 34.8 mL/min (A) (by C-G formula based on SCr of 1.64 mg/dL (H)).    Allergies  Allergen Reactions  . Other Other (See Comments)    Regarding scans, the PATIENT IS CLAUSTROPHOBIC!!!  . Phenergan [Promethazine Hcl] Swelling and Other (See Comments)    Cannot be combined with Zofran because swelling of the eyes resulted and patient became very restless-  . Zofran [Ondansetron Hcl] Swelling and Other (See Comments)    Cannot be combined with Phenergan because swelling of the eyes resulted and patient became very restless-  . Sulfamethoxazole Rash and Itching    Antimicrobials this admission: Cefepime 12/10>> Vanc /flagyl 12/10 x1  Dose adjustments this admission: N/A  Microbiology results: Bcx 12/10: NGTD Ucx 12/10: sent  Antonietta Jewel, PharmD, [place] Pharmacist  Phone: [phone]  Please check AMION for all [place] phone numbers After 10:00 PM, call [place] [phone] 01/20/2019 10:30 AM

## 2019-01-20 NOTE — H&P (View-Only) (Signed)
[facility] Progress Note  CC:  Dysphagia  Subjective:  Feeling better from breathing standpoint today.  Anxious to get EGD done.  Objective:  Vital signs in last 24 hours: Temp:  [97.5 F (36.4 C)-98.8 F (37.1 C)] 97.5 F (36.4 C) (12/11 0826) Pulse Rate:  [66-102] 73 (12/11 0826) Resp:  [8-33] 18 (12/11 0502) BP: (93-133)/(32-71) 121/51 (12/11 0826) SpO2:  [85 %-100 %] 99 % (12/11 0826) Weight:  [69.4 kg-69.7 kg] 69.4 kg (12/11 0502)   General:  Alert, Well-developed, in NAD Heart:  Regular rate and rhythm; no murmurs Pulm:  CTAB anteriorly.  No increased WOB. Abdomen:  Soft, non-distended.  BS present.  Non-tender. Extremities:  Without edema. Neurologic:  Alert and oriented x 4;  grossly normal neurologically. Psych:  Alert and cooperative. Normal mood and affect.  Intake/Output from previous day: 12/10 0701 - 12/11 0700 In: 1607.7 [I.V.:1107.7; IV Piggyback:500] Out: 125 [Urine:125]  Lab Results: Recent Labs    01/12/2019 0800 01/20/19 0155  WBC 10.9* 9.5  HGB 10.2* 9.1*  HCT 31.0* 27.8*  PLT 234 198   BMET Recent Labs    01/27/2019 0800 01/20/19 0155  NA 138 140  K 3.0* 3.4*  CL 100 108  CO2 28 23  GLUCOSE 98 90  BUN 9 11  CREATININE 1.84* 1.64*  CALCIUM 8.5* 8.0*   LFT Recent Labs    01/24/2019 0800  PROT 5.2*  ALBUMIN 3.2*  AST 45*  ALT 18  ALKPHOS 67  BILITOT 1.3*   PT/INR Recent Labs    01/27/2019 0800  LABPROT 15.2  INR 1.2   DG Abdomen 1 View  Result Date: 01/24/2019 CLINICAL DATA:  Abdominal swelling, fever EXAM: ABDOMEN - 1 VIEW COMPARISON:  06/17/2015 FINDINGS: Nonspecific bowel gas pattern without evidence of obstruction. No gross free intraperitoneal air. No radio-opaque calculi or other significant radiographic abnormality are seen. IMPRESSION: Nonobstructive bowel gas pattern. Electronically Signed   By: Davina Poke M.D.   On: 01/20/2019 09:48   DG Chest Port 1 View  Result Date: 01/31/2019 CLINICAL  DATA:  Assess for obstruction. Fever and weakness with nausea and vomiting 2 weeks. EXAM: PORTABLE CHEST 1 VIEW COMPARISON:  10/30/2018 FINDINGS: Lungs are adequately inflated with airspace consolidation over the right base which may be due to pneumonia or atelectasis. No evidence of effusion. Borderline stable cardiomegaly. Remainder of the exam is unchanged. IMPRESSION: Opacification over the right base which may be due to atelectasis or pneumonia. Electronically Signed   By: Marin Olp M.D.   On: 01/23/2019 09:44   Assessment / Plan: *Dysphagia:  EGD several years ago failed to demonstrate esophageal stricture but there was esophageal dysmotility on barium esophagram.  Dysphagia started occurring about 2 weeks or so after his cervical spine surgery and CT scan of the neck 6 days ago revealed sialolithiasis and chronic right maxillary sinusitis but no swelling in the surgical region to suggest postsurgical cause to the dysphagia.  *Anemia, chronic, normocytic in patient with chronic myeloid leukemia.  *History DVT, carotid artery disease, stroke.  On chronic Eliquis.  Last dose of this was taken in the morning of 12/9.  Now on IV heparin.  *Right basilar pneumonia likely sequela of aspiration  *Myasthenia gravis, on Mestinon.  Raises question of neurogenic dysphagia.  He also mentions issues with his voice as well.  *COVID-19 negative  -I think that he is stable for EGD on 12/12.  Will need to hold the heparin for 6 hours prior (  this has not yet been done).   LOS: 1 day   [name]. [place]  01/20/2019, 9:23 AM

## 2019-01-20 NOTE — Progress Notes (Addendum)
[facility] Progress Note  CC:  Dysphagia  Subjective:  Feeling better from breathing standpoint today.  Anxious to get EGD done.  Objective:  Vital signs in last 24 hours: Temp:  [97.5 F (36.4 C)-98.8 F (37.1 C)] 97.5 F (36.4 C) (12/11 0826) Pulse Rate:  [66-102] 73 (12/11 0826) Resp:  [8-33] 18 (12/11 0502) BP: (93-133)/(32-71) 121/51 (12/11 0826) SpO2:  [85 %-100 %] 99 % (12/11 0826) Weight:  [69.4 kg-69.7 kg] 69.4 kg (12/11 0502)   General:  Alert, Well-developed, in NAD Heart:  Regular rate and rhythm; no murmurs Pulm:  CTAB anteriorly.  No increased WOB. Abdomen:  Soft, non-distended.  BS present.  Non-tender. Extremities:  Without edema. Neurologic:  Alert and oriented x 4;  grossly normal neurologically. Psych:  Alert and cooperative. Normal mood and affect.  Intake/Output from previous day: 12/10 0701 - 12/11 0700 In: 1607.7 [I.V.:1107.7; IV Piggyback:500] Out: 125 [Urine:125]  Lab Results: Recent Labs    02/03/2019 0800 01/20/19 0155  WBC 10.9* 9.5  HGB 10.2* 9.1*  HCT 31.0* 27.8*  PLT 234 198   BMET Recent Labs    01/31/2019 0800 01/20/19 0155  NA 138 140  K 3.0* 3.4*  CL 100 108  CO2 28 23  GLUCOSE 98 90  BUN 9 11  CREATININE 1.84* 1.64*  CALCIUM 8.5* 8.0*   LFT Recent Labs    01/22/2019 0800  PROT 5.2*  ALBUMIN 3.2*  AST 45*  ALT 18  ALKPHOS 67  BILITOT 1.3*   PT/INR Recent Labs    01/13/2019 0800  LABPROT 15.2  INR 1.2   DG Abdomen 1 View  Result Date: 01/23/2019 CLINICAL DATA:  Abdominal swelling, fever EXAM: ABDOMEN - 1 VIEW COMPARISON:  06/17/2015 FINDINGS: Nonspecific bowel gas pattern without evidence of obstruction. No gross free intraperitoneal air. No radio-opaque calculi or other significant radiographic abnormality are seen. IMPRESSION: Nonobstructive bowel gas pattern. Electronically Signed   By: Davina Poke M.D.   On: 01/12/2019 09:48   DG Chest Port 1 View  Result Date: 01/13/2019 CLINICAL  DATA:  Assess for obstruction. Fever and weakness with nausea and vomiting 2 weeks. EXAM: PORTABLE CHEST 1 VIEW COMPARISON:  10/30/2018 FINDINGS: Lungs are adequately inflated with airspace consolidation over the right base which may be due to pneumonia or atelectasis. No evidence of effusion. Borderline stable cardiomegaly. Remainder of the exam is unchanged. IMPRESSION: Opacification over the right base which may be due to atelectasis or pneumonia. Electronically Signed   By: Marin Olp M.D.   On: 01/26/2019 09:44   Assessment / Plan: *Dysphagia:  EGD several years ago failed to demonstrate esophageal stricture but there was esophageal dysmotility on barium esophagram.  Dysphagia started occurring about 2 weeks or so after his cervical spine surgery and CT scan of the neck 6 days ago revealed sialolithiasis and chronic right maxillary sinusitis but no swelling in the surgical region to suggest postsurgical cause to the dysphagia.  *Anemia, chronic, normocytic in patient with chronic myeloid leukemia.  *History DVT, carotid artery disease, stroke.  On chronic Eliquis.  Last dose of this was taken in the morning of 12/9.  Now on IV heparin.  *Right basilar pneumonia likely sequela of aspiration  *Myasthenia gravis, on Mestinon.  Raises question of neurogenic dysphagia.  He also mentions issues with his voice as well.  *COVID-19 negative  -I think that he is stable for EGD on 12/12.  Will need to hold the heparin for 6 hours prior (  this has not yet been done).   LOS: 1 day   [name]. [name]  01/20/2019, 9:23 AM

## 2019-01-20 NOTE — Progress Notes (Signed)
ANTICOAGULATION CONSULT NOTE  Pharmacy Consult for heparin Indication: history atrial fibrillation, PE/DVT  Heparin Dosing Weight: 67.6 kg  Labs: Recent Labs    01/13/2019 0800 01/10/2019 1740 01/20/19 0155 01/20/19 0802  HGB 10.2*  --  9.1*  --   HCT 31.0*  --  27.8*  --   PLT 234  --  198  --   APTT 30 38*  --  124*  LABPROT 15.2  --   --   --   INR 1.2  --   --   --   HEPARINUNFRC  --  0.72*  --  0.89*  CREATININE 1.84*  --  1.64*  --     Assessment: 78 yom on apixaban PTA for afib presenting with fever, tachycardia, hypoxia with new oxygen requirement. Pharmacy consulted to dose heparin. Last dose of apixaban reported 01/18/19 at 1200.   APTT this morning came back elevated at 124, heparin level also remains falsely elevated at 0.89, on 1000 units/hr. Hgb down slightly to 9.1, plt 198. No s/sx of bleeding or infusion issues.   Continue to monitor both aPTT and HL until correlate.   Goal of Therapy:  Heparin level 0.3-0.7 units/ml aPTT 66-102 seconds Monitor platelets by anticoagulation protocol: Yes   Plan:  Reduce heparin infusion to 850 units/hr  Order 8 hr aPTT Monitor daily heparin level and CBC, s/sx bleeding  Antonietta Jewel, PharmD, BCCCP Clinical Pharmacist  Phone: [phone]  Please check AMION for all [place] phone numbers After 10:00 PM, call [place] [phone] 01/20/2019 10:18 AM

## 2019-01-21 ENCOUNTER — Inpatient Hospital Stay (HOSPITAL_COMMUNITY): Payer: Medicare Other | Admitting: Anesthesiology

## 2019-01-21 ENCOUNTER — Encounter (HOSPITAL_COMMUNITY): Payer: Self-pay | Admitting: Internal Medicine

## 2019-01-21 ENCOUNTER — Encounter (HOSPITAL_COMMUNITY): Admission: EM | Disposition: E | Payer: Self-pay | Source: Home / Self Care | Attending: Internal Medicine

## 2019-01-21 DIAGNOSIS — K2990 Gastroduodenitis, unspecified, without bleeding: Secondary | ICD-10-CM

## 2019-01-21 DIAGNOSIS — K317 Polyp of stomach and duodenum: Secondary | ICD-10-CM

## 2019-01-21 DIAGNOSIS — K222 Esophageal obstruction: Secondary | ICD-10-CM

## 2019-01-21 HISTORY — PX: ESOPHAGOGASTRODUODENOSCOPY (EGD) WITH PROPOFOL: SHX5813

## 2019-01-21 HISTORY — PX: BIOPSY: SHX5522

## 2019-01-21 HISTORY — PX: ESOPHAGEAL DILATION: SHX303

## 2019-01-21 LAB — SURGICAL PCR SCREEN
MRSA, PCR: NEGATIVE
Staphylococcus aureus: NEGATIVE

## 2019-01-21 LAB — CBC
HCT: 25.8 % — ABNORMAL LOW (ref 39.0–52.0)
Hemoglobin: 8.4 g/dL — ABNORMAL LOW (ref 13.0–17.0)
MCH: 32.2 pg (ref 26.0–34.0)
MCHC: 32.6 g/dL (ref 30.0–36.0)
MCV: 98.9 fL (ref 80.0–100.0)
Platelets: 194 10*3/uL (ref 150–400)
RBC: 2.61 MIL/uL — ABNORMAL LOW (ref 4.22–5.81)
RDW: 14.6 % (ref 11.5–15.5)
WBC: 8.1 10*3/uL (ref 4.0–10.5)
nRBC: 0 % (ref 0.0–0.2)

## 2019-01-21 SURGERY — ESOPHAGOGASTRODUODENOSCOPY (EGD) WITH PROPOFOL
Anesthesia: Monitor Anesthesia Care

## 2019-01-21 MED ORDER — SODIUM CHLORIDE 0.9 % IV SOLN: INTRAVENOUS | Status: DC

## 2019-01-21 MED ORDER — MUPIROCIN 2 % EX OINT
1.0000 "application " | TOPICAL_OINTMENT | Freq: Two times a day (BID) | CUTANEOUS | Status: DC
  Administered 2019-01-21 – 2019-01-25 (×8): 1 via NASAL
  Filled 2019-01-21 (×3): qty 22

## 2019-01-21 MED ORDER — ONDANSETRON HCL 4 MG/2ML IJ SOLN
INTRAMUSCULAR | Status: DC | PRN
  Administered 2019-01-21: 4 mg via INTRAVENOUS

## 2019-01-21 MED ORDER — PROPOFOL 500 MG/50ML IV EMUL
INTRAVENOUS | Status: DC | PRN
  Administered 2019-01-21: 100 ug/kg/min via INTRAVENOUS

## 2019-01-21 MED ORDER — SODIUM CHLORIDE 0.9 % IV SOLN
INTRAVENOUS | Status: AC
  Administered 2019-01-21 – 2019-01-22 (×2): via INTRAVENOUS

## 2019-01-21 MED ORDER — IMATINIB MESYLATE 100 MG PO TABS
300.0000 mg | ORAL_TABLET | Freq: Every day | ORAL | Status: DC
  Administered 2019-01-21: 300 mg via ORAL
  Filled 2019-01-21 (×6): qty 3

## 2019-01-21 MED ORDER — SODIUM CHLORIDE 0.9 % IV SOLN
3.0000 g | Freq: Three times a day (TID) | INTRAVENOUS | Status: DC
  Administered 2019-01-21 – 2019-01-25 (×12): 3 g via INTRAVENOUS
  Filled 2019-01-21 (×5): qty 3
  Filled 2019-01-21: qty 8
  Filled 2019-01-21: qty 3
  Filled 2019-01-21 (×2): qty 8
  Filled 2019-01-21: qty 3
  Filled 2019-01-21: qty 8
  Filled 2019-01-21 (×2): qty 3
  Filled 2019-01-21: qty 8
  Filled 2019-01-21 (×2): qty 3

## 2019-01-21 MED ORDER — POTASSIUM CHLORIDE 20 MEQ PO PACK
20.0000 meq | PACK | Freq: Once | ORAL | Status: AC
  Administered 2019-01-21: 20 meq via ORAL
  Filled 2019-01-21 (×2): qty 1

## 2019-01-21 SURGICAL SUPPLY — 15 items

## 2019-01-21 NOTE — Progress Notes (Signed)
[facility] PROGRESS NOTE  ZAMAN RINGOLD [id number] DOB: [birth year] DOA: 02/09/2019 PCP: Libby Maw, MD  Assessment/Plan:  Hamdaan Koegler Virgin is a 78 y.o. male with medical history significant of CVA; PE; pituitary tumor; PVD s/p R AKA; myasthenia gravis; HLD; glaucoma; HTN; DVT; DM; CAD; CKD; and chronic back pain presenting with fever.  He reports he was vomiting all night and he couldn't sleep.  He developed fever.  He hasn't been able to swallow for weeks, there is a problem with his swallow and he is hoarse. Started with food dysphagia, some pills he can no longer take, now having liquid dysphagia.  He vomits with small amounts of eating with thick white mucus.  He got a bit less responsive overnight.  He feels better already with the IVF.  Dysphagia started about 2 months after neck surgery, and this appears to unrelated.  He was scheduled for a swllow test next Wednesday and EGD 12/30.   Aspiration pneumonia. hemodynamically stable.  -improving, cont iv antibiotics, blood cultures: NGTD. Speech eval   Sepsis. SIRS criteria in this patient includes: Leukocytosis, tachypnea, hypoxia, tachycardia .  -improving, cont as above. COVID/respiratory panel negative  Dysphagia. Patient with progressive dysphagia - initially with solids and progressive to liquids -underwent EGD (12/12): found to have esophageal stenosis. S/p dilatation. Cont PPI. Diet advance by tomorrow  CML. Resume Imantinib   CAD. Continue Imdur, Crestor  DM. He does not appear to be taking medication for this issue at this time -Recent A1c was 5.8 on 9/28  H/o PE on [facility]. Holding Eliquis periprocedure. Resume post procedure 48 hrs. No recent h/o VTE (d/w patient, his family. Last VTE 2013)  HTN. He does not appear to be taking medications for this issue at this time  Myasthenia gravis. Continue physostigmine   Code Status: full Family Communication: d/w patient, Therapist, sports. Recently updated his family  (indicate person spoken with, relationship, and if by phone, the number) Disposition Plan: remains inpatient. Possible home 24-48 hrs    Consultants:  GI  Procedures:  EGD, dilatation   Antibiotics: Anti-infectives (From admission, onward)   Start     Dose/Rate Route Frequency Ordered Stop   01/15/2019 1145  Ampicillin-Sulbactam (UNASYN) 3 g in sodium chloride 0.9 % 100 mL IVPB     3 g 200 mL/hr over 30 Minutes Intravenous Every 8 hours 01/29/2019 1144     01/24/2019 0730  vancomycin (VANCOCIN) 1,500 mg in sodium chloride 0.9 % 500 mL IVPB  Status:  Discontinued     1,500 mg 250 mL/hr over 120 Minutes Intravenous Every 48 hours 02/05/2019 0853 01/15/2019 1221   01/20/19 1100  ceFEPIme (MAXIPIME) 2 g in sodium chloride 0.9 % 100 mL IVPB  Status:  Discontinued     2 g 200 mL/hr over 30 Minutes Intravenous Every 12 hours 01/20/19 1030 01/29/2019 1144   01/20/19 0730  ceFEPIme (MAXIPIME) 2 g in sodium chloride 0.9 % 100 mL IVPB  Status:  Discontinued     2 g 200 mL/hr over 30 Minutes Intravenous Every 24 hours 01/20/2019 0853 01/24/2019 1221   01/20/19 0730  ceFEPIme (MAXIPIME) 2 g in sodium chloride 0.9 % 100 mL IVPB  Status:  Discontinued     2 g 200 mL/hr over 30 Minutes Intravenous Every 24 hours 01/14/2019 1221 01/20/19 1030   01/24/2019 0715  vancomycin (VANCOCIN) 1,500 mg in sodium chloride 0.9 % 500 mL IVPB     1,500 mg 250 mL/hr over 120 Minutes Intravenous  Once  01/11/2019 0700 02/07/2019 1225   02/02/2019 0700  ceFEPIme (MAXIPIME) 2 g in sodium chloride 0.9 % 100 mL IVPB     2 g 200 mL/hr over 30 Minutes Intravenous  Once 01/28/2019 0656 02/06/2019 0836   01/30/2019 0700  metroNIDAZOLE (FLAGYL) IVPB 500 mg     500 mg 100 mL/hr over 60 Minutes Intravenous  Once 02/02/2019 0656 01/24/2019 0938   01/28/2019 0700  vancomycin (VANCOCIN) IVPB 1000 mg/200 mL premix  Status:  Discontinued     1,000 mg 200 mL/hr over 60 Minutes Intravenous  Once 01/24/2019 0656 02/05/2019 0700       (indicate start date, and stop  date if known)  HPI/Subjective: Afebrile overnight. No distress. +cough with sputum. S/p EGD dilatation   Objective: Vitals:   01/29/2019 1025 01/18/2019 1034  BP: (!) 116/53 (!) 140/93  Pulse: 75 80  Resp: 14 18  Temp:    SpO2: 99% 98%    Intake/Output Summary (Last 24 hours) at 01/29/2019 1148 Last data filed at 01/23/2019 1006 Gross per 24 hour  Intake 400 ml  Output 250 ml  Net 150 ml   Filed Weights   01/20/19 0502 01/27/2019 0334 01/23/2019 0934  Weight: 69.4 kg 69.5 kg 69.5 kg    Exam:   General:  No distress   Cardiovascular: s1,s2 rrr  Respiratory: diminished LL  Abdomen: soft, nt   Musculoskeletal: no edema   Data Reviewed: Basic Metabolic Panel: Recent Labs  Lab 02/05/2019 0800 01/20/19 0155  NA 138 140  K 3.0* 3.4*  CL 100 108  CO2 28 23  GLUCOSE 98 90  BUN 9 11  CREATININE 1.84* 1.64*  CALCIUM 8.5* 8.0*   Liver Function Tests: Recent Labs  Lab 02/09/2019 0800  AST 45*  ALT 18  ALKPHOS 67  BILITOT 1.3*  PROT 5.2*  ALBUMIN 3.2*   Recent Labs  Lab 01/18/2019 0800  LIPASE 26   No results for input(s): AMMONIA in the last 168 hours. CBC: Recent Labs  Lab 02/06/2019 0800 01/20/19 0155 01/21/19 0315  WBC 10.9* 9.5 8.1  NEUTROABS 7.5 6.5  --   HGB 10.2* 9.1* 8.4*  HCT 31.0* 27.8* 25.8*  MCV 95.7 96.9 98.9  PLT 234 198 194   Cardiac Enzymes: No results for input(s): CKTOTAL, CKMB, CKMBINDEX, TROPONINI in the last 168 hours. BNP (last 3 results) No results for input(s): BNP in the last 8760 hours.  ProBNP (last 3 results) No results for input(s): PROBNP in the last 8760 hours.  CBG: Recent Labs  Lab 01/13/2019 2050  GLUCAP 89    Recent Results (from the past 240 hour(s))  Blood Culture (routine x 2)     Status: None (Preliminary result)   Collection Time: 01/27/2019  7:40 AM   Specimen: BLOOD  Result Value Ref Range Status   Specimen Description BLOOD RIGHT ANTECUBITAL  Final   Special Requests   Final    BOTTLES DRAWN AEROBIC  ONLY Blood Culture adequate volume   Culture   Final    NO GROWTH 2 DAYS Performed at [facility], [address] [place], [place] [address]    Report Status PENDING  Incomplete  Respiratory Panel by RT PCR (Flu A&B, Covid) - Nasopharyngeal Swab     Status: None   Collection Time: 01/29/2019 11:54 AM   Specimen: Nasopharyngeal Swab  Result Value Ref Range Status   SARS Coronavirus 2 by RT PCR NEGATIVE NEGATIVE Final    Comment: (NOTE) SARS-CoV-2 target nucleic acids  are NOT DETECTED. The SARS-CoV-2 RNA is generally detectable in upper respiratoy specimens during the acute phase of infection. The lowest concentration of SARS-CoV-2 viral copies this assay can detect is 131 copies/mL. A negative result does not preclude SARS-Cov-2 infection and should not be used as the sole basis for treatment or other patient management decisions. A negative result may occur with  improper specimen collection/handling, submission of specimen other than nasopharyngeal swab, presence of viral mutation(s) within the areas targeted by this assay, and inadequate number of viral copies (<131 copies/mL). A negative result must be combined with clinical observations, patient history, and epidemiological information. The expected result is Negative. Fact Sheet for Patients:  PinkCheek.be Fact Sheet for Healthcare Providers:  GravelBags.it This test is not yet ap proved or cleared by the [place] FDA and  has been authorized for detection and/or diagnosis of SARS-CoV-2 by FDA under an Emergency Use Authorization (EUA). This EUA will remain  in effect (meaning this test can be used) for the duration of the COVID-19 declaration under Section 564(b)(1) of the Act, 21 U.S.C. section 360bbb-3(b)(1), unless the authorization is terminated or revoked sooner.    Influenza A by PCR NEGATIVE NEGATIVE Final   Influenza B by PCR NEGATIVE NEGATIVE  Final    Comment: (NOTE) The Xpert Xpress SARS-CoV-2/FLU/RSV assay is intended as an aid in  the diagnosis of influenza from Nasopharyngeal swab specimens and  should not be used as a sole basis for treatment. Nasal washings and  aspirates are unacceptable for Xpert Xpress SARS-CoV-2/FLU/RSV  testing. Fact Sheet for Patients: PinkCheek.be Fact Sheet for Healthcare Providers: GravelBags.it This test is not yet approved or cleared by the [place] FDA and  has been authorized for detection and/or diagnosis of SARS-CoV-2 by  FDA under an Emergency Use Authorization (EUA). This EUA will remain  in effect (meaning this test can be used) for the duration of the  Covid-19 declaration under Section 564(b)(1) of the Act, 21  U.S.C. section 360bbb-3(b)(1), unless the authorization is  terminated or revoked. Performed at [facility], [place] [address] [place], [place] [address]   Blood Culture (routine x 2)     Status: None (Preliminary result)   Collection Time: 01/15/2019  5:40 PM   Specimen: BLOOD LEFT ARM  Result Value Ref Range Status   Specimen Description BLOOD LEFT ARM  Final   Special Requests   Final    BOTTLES DRAWN AEROBIC AND ANAEROBIC Blood Culture results may not be optimal due to an inadequate volume of blood received in culture bottles   Culture   Final    NO GROWTH 2 DAYS Performed at [facility], [place] [address] [place], [place] [address]    Report Status PENDING  Incomplete  Surgical PCR screen     Status: None   Collection Time: 01/14/2019  8:55 AM   Specimen: Nasal Mucosa; Nasal Swab  Result Value Ref Range Status   MRSA, PCR NEGATIVE NEGATIVE Final   Staphylococcus aureus NEGATIVE NEGATIVE Final    Comment: (NOTE) The Xpert SA Assay (FDA approved for NASAL specimens in patients 71 years of age and older), is one component of a comprehensive surveillance program. It is not intended to diagnose  infection nor to guide or monitor treatment. Performed at [facility], [place] [address] [place], [place] [address]      Studies: No results found.  Scheduled Meds: . brimonidine  1 drop Both Eyes BID  . gabapentin  300 mg Oral TID  . imatinib  300 mg Oral Q lunch  . isosorbide mononitrate  30 mg Oral Daily  . mupirocin ointment  1 application Nasal BID  . pyridostigmine  60 mg Oral Daily  . rosuvastatin  5 mg Oral Daily  . timolol  1 drop Both Eyes BID   Continuous Infusions: . sodium chloride 75 mL/hr at 01/20/2019 0740  . ampicillin-sulbactam (UNASYN) IV      Principal Problem:   Sepsis due to pneumonia [facility]) Active Problems:   Chronic myeloid leukemia in remission ([place])   HYPERLIPIDEMIA, MIXED   Diabetes mellitus type 2, controlled ([place])   History of pulmonary embolism   Chronic anticoagulation   Myasthenia gravis ([place])   Benign essential HTN   Dysphagia   Gastritis and gastroduodenitis   Esophageal stricture    Time spent: >25 minutes     Kinnie Feil  [facility] Pager [phone]. If 7PM-7AM, please contact night-coverage at [email or web address], password [facility] 01/29/2019, 11:48 AM  LOS: 2 days

## 2019-01-21 NOTE — Transfer of Care (Signed)
Immediate Anesthesia Transfer of Care Note  Patient: [name]  Procedure(s) Performed: ESOPHAGOGASTRODUODENOSCOPY (EGD) WITH PROPOFOL (N/A ) ESOPHAGEAL DILATION  Patient Location: PACU and Endoscopy Unit  Anesthesia Type:MAC  Level of Consciousness: patient cooperative and responds to stimulation  Airway & Oxygen Therapy: Patient Spontanous Breathing and Patient connected to nasal cannula oxygen  Post-op Assessment: Report given to RN and Post -op Vital signs reviewed and stable  Post vital signs: Reviewed and stable  Last Vitals:  Vitals Value Taken Time  BP    Temp    Pulse    Resp    SpO2      Last Pain:  Vitals:   01/31/2019 0934  TempSrc: Oral  PainSc: 0-No pain      Patients Stated Pain Goal: 0 (84/21/03 1281)  Complications: No apparent anesthesia complications

## 2019-01-21 NOTE — Anesthesia Postprocedure Evaluation (Signed)
Anesthesia Post Note  Patient: [name]  Procedure(s) Performed: ESOPHAGOGASTRODUODENOSCOPY (EGD) WITH PROPOFOL (N/A ) ESOPHAGEAL DILATION     Patient location during evaluation: PACU Anesthesia Type: MAC Level of consciousness: awake and alert Pain management: pain level controlled Vital Signs Assessment: post-procedure vital signs reviewed and stable Respiratory status: spontaneous breathing, nonlabored ventilation, respiratory function stable and patient connected to nasal cannula oxygen Cardiovascular status: stable and blood pressure returned to baseline Postop Assessment: no apparent nausea or vomiting Anesthetic complications: no    Last Vitals:  Vitals:   01/18/2019 1025 01/10/2019 1034  BP: (!) 116/53 (!) 140/93  Pulse: 75 80  Resp: 14 18  Temp:    SpO2: 99% 98%    Last Pain:  Vitals:   01/26/2019 1034  TempSrc:   PainSc: 0-No pain                 Phuong Moffatt DAVID

## 2019-01-21 NOTE — Progress Notes (Addendum)
Endoscopy findings reviewed with the wife by telephone [phone]).  Time provided for questions and answers and she thanked me for the call.  Clear liquid diet today. Advance tomorrow as tolerated. Resume Eliquis in 48 hours.  Please call the on-call gastroenterologist with any additional questions or concerns during this hospitalization.

## 2019-01-21 NOTE — Anesthesia Preprocedure Evaluation (Signed)
Anesthesia Evaluation  Patient identified by MRN, date of birth, ID band Patient awake    Reviewed: Allergy & Precautions, NPO status , Patient's Chart, lab work & pertinent test results  History of Anesthesia Complications (+) PONV  Airway Mallampati: I  TM Distance: >3 FB Neck ROM: Full    Dental   Pulmonary    Pulmonary exam normal        Cardiovascular hypertension, Pt. on medications + CAD and + Peripheral Vascular Disease  Normal cardiovascular exam     Neuro/Psych Anxiety Myesthenia gravis on Prednisone CVA    GI/Hepatic GERD  Controlled,  Endo/Other  diabetes, Type 2  Renal/GU Renal InsufficiencyRenal disease     Musculoskeletal   Abdominal   Peds  Hematology   Anesthesia Other Findings   Reproductive/Obstetrics                             Anesthesia Physical Anesthesia Plan  ASA: III  Anesthesia Plan: MAC   Post-op Pain Management:    Induction: Intravenous  PONV Risk Score and Plan: 2 and Ondansetron and Treatment may vary due to age or medical condition  Airway Management Planned: Nasal Cannula  Additional Equipment:   Intra-op Plan:   Post-operative Plan:   Informed Consent: I have reviewed the patients History and Physical, chart, labs and discussed the procedure including the risks, benefits and alternatives for the proposed anesthesia with the patient or authorized representative who has indicated his/her understanding and acceptance.       Plan Discussed with: CRNA and Surgeon  Anesthesia Plan Comments:         Anesthesia Quick Evaluation

## 2019-01-21 NOTE — Interval H&P Note (Signed)
History and Physical Interval Note:  02/09/2019 9:37 AM  [name]  has presented today for surgery, with the diagnosis of Dysphagia.  The various methods of treatment have been discussed with the patient and family. After consideration of risks, benefits and other options for treatment, the patient has consented to  Procedure(s): ESOPHAGOGASTRODUODENOSCOPY (EGD) WITH PROPOFOL (N/A) as a surgical intervention.  The patient's history has been reviewed, patient examined, no change in status, stable for surgery.  I have reviewed the patient's chart and labs.  Questions were answered to the patient's satisfaction.     Thornton Park

## 2019-01-21 NOTE — Op Note (Signed)
[facility] Patient Name: [name] Procedure Date : 01/27/2019 MRN: [id number] Attending MD: Thornton Park MD, MD Date of Birth: 1940-03-13 CSN: [id number] Age: 78 Admit Type: Inpatient Procedure:                Upper GI endoscopy Indications:              Dysphagia Providers:                Thornton Park MD, MD, Benay Pillow, RN, Grace Isaac, RN, William Dalton, Technician Referring MD:              Medicines:                Monitored Anesthesia Care Complications:            No immediate complications. Estimated blood loss:                            Minimal. Estimated Blood Loss:     Estimated blood loss was minimal. Procedure:                Pre-Anesthesia Assessment:                           - Prior to the procedure, a History and Physical                            was performed, and patient medications and                            allergies were reviewed. The patient's tolerance of                            previous anesthesia was also reviewed. The risks                            and benefits of the procedure and the sedation                            options and risks were discussed with the patient.                            All questions were answered, and informed consent                            was obtained. Prior Anticoagulants: The patient has                            taken Eliquis (apixaban), last dose was 3 days                            prior to procedure. ASA Grade Assessment: III - A  patient with severe systemic disease. After                            reviewing the risks and benefits, the patient was                            deemed in satisfactory condition to undergo the                            procedure.                           After obtaining informed consent, the endoscope was                            passed under direct vision. Throughout the                 procedure, the patient's blood pressure, pulse, and                            oxygen saturations were monitored continuously. The                            [id number]) Olympus gastroscope was                            introduced through the mouth, and advanced to the                            third part of duodenum. The upper GI endoscopy was                            accomplished without difficulty. The patient                            tolerated the procedure well. Scope In: Scope Out: Findings:      One benign-appearing, intrinsic moderate (circumferential scarring or       stenosis; an endoscope may pass) stricture was found 40 cm from the       incisors. This stricture measured less than one cm (in length). The       stricture was traversed without resistance. A TTS dilator was passed       through the scope. Dilation with a 15-16.5-18 mm balloon dilator was       performed to 18 mm. There was no resistance with the 15 or 16.68mm       dilators. The 52mm balloon dilator was inflated for 60 seconds over the       stricture. The dilation site was examined and showed moderate mucosal       disruption. Estimated blood loss was minimal.      Multiple small sessile polyps were found in the gastric body. Biopsies       were taken with a cold forceps for histology. Estimated blood loss was       minimal.      Diffuse mild inflammation characterized by friability and granularity  was found in the gastric body. The mucosa has a subtle, diffuse nodular       appearance. Biopsies were taken from the antrum, body, and fundus with a       cold forceps for histology. Estimated blood loss was minimal.      Diffuse moderately erythematous mucosa without active bleeding and with       no stigmata of bleeding was found in the duodenal bulb. No ulcer seen.      The cardia and gastric fundus were normal on retroflexion. Impression:               - Benign-appearing  esophageal stenosis. Dilated.                           - Multiple gastric polyps. Biopsied.                           - Gastritis. Biopsied.                           - Erythematous duodenopathy. Recommendation:           - Patient has a contact number available for                            emergencies. The signs and symptoms of potential                            delayed complications were discussed with the                            patient. Return to normal activities tomorrow.                            Written discharge instructions were provided to the                            patient.                           - Clear liquid diet today. Advance tomorrow if                            tolerated.                           - Continue present medications.                           - Avoid NSAIDs as you are able.                           - PPI BID x 8 weeks.                           - Await pathology results.                           - Resume Eliquis  in 48 hours. Procedure Code(s):        --- Professional ---                           [phone], Esophagogastroduodenoscopy, flexible,                            transoral; with transendoscopic balloon dilation of                            esophagus (less than 30 mm diameter)                           [id number], 59, Esophagogastroduodenoscopy, flexible,                            transoral; with biopsy, single or multiple Diagnosis Code(s):        --- Professional ---                           K22.2, Esophageal obstruction                           K31.7, Polyp of stomach and duodenum                           K29.70, Gastritis, unspecified, without bleeding                           K31.89, Other diseases of stomach and duodenum                           R13.10, Dysphagia, unspecified CPT copyright 2019 American Medical Association. All rights reserved. The codes documented in this report are preliminary and upon coder review may  be  revised to meet current compliance requirements. Thornton Park MD, MD 01/19/2019 10:28:15 AM This report has been signed electronically. Number of Addenda: 0

## 2019-01-22 LAB — CBC
HCT: 25.9 % — ABNORMAL LOW (ref 39.0–52.0)
Hemoglobin: 8.4 g/dL — ABNORMAL LOW (ref 13.0–17.0)
MCH: 32.1 pg (ref 26.0–34.0)
MCHC: 32.4 g/dL (ref 30.0–36.0)
MCV: 98.9 fL (ref 80.0–100.0)
Platelets: 185 10*3/uL (ref 150–400)
RBC: 2.62 MIL/uL — ABNORMAL LOW (ref 4.22–5.81)
RDW: 14.9 % (ref 11.5–15.5)
WBC: 6.8 10*3/uL (ref 4.0–10.5)
nRBC: 0 % (ref 0.0–0.2)

## 2019-01-22 LAB — BASIC METABOLIC PANEL
Anion gap: 10 (ref 5–15)
BUN: 12 mg/dL (ref 8–23)
CO2: 22 mmol/L (ref 22–32)
Calcium: 8.3 mg/dL — ABNORMAL LOW (ref 8.9–10.3)
Chloride: 113 mmol/L — ABNORMAL HIGH (ref 98–111)
Creatinine, Ser: 1.58 mg/dL — ABNORMAL HIGH (ref 0.61–1.24)
GFR calc Af Amer: 47 mL/min — ABNORMAL LOW (ref >60)
GFR calc non Af Amer: 41 mL/min — ABNORMAL LOW (ref >60)
Glucose, Bld: 93 mg/dL (ref 70–99)
Potassium: 3.5 mmol/L (ref 3.5–5.1)
Sodium: 145 mmol/L (ref 135–145)

## 2019-01-22 MED ORDER — PANTOPRAZOLE SODIUM 20 MG PO TBEC
20.0000 mg | DELAYED_RELEASE_TABLET | Freq: Two times a day (BID) | ORAL | Status: DC
  Administered 2019-01-22 – 2019-01-23 (×3): 20 mg via ORAL
  Filled 2019-01-22 (×3): qty 1

## 2019-01-22 NOTE — Progress Notes (Signed)
Pt tolerated apple sauce. Wife at bedside. Pt did cough/ gag with thin liquids, thick tan/ clear mucus noted with suction. RN put in Speech consult, hold off on thin liquids for now.  Pt's family requested to speak to MD. Wyonia Hough, MD notified of pt's status as well as family request. Will await for MD response.

## 2019-01-22 NOTE — Progress Notes (Signed)
PROGRESS NOTE    [name]  [id number] DOB: 05/30/1940 DOA: 01/13/2019 PCP: Libby Maw, MD   Brief Narrative:  [name] a 78 y.o.malewith medical history significant ofCVA; PE; pituitary tumor; PVDs/p R AKA; myasthenia gravis; HLD; glaucoma; HTN; DVT; DM; CAD; CKD; and chronic back pain presenting with fever.He reports he was vomiting all night and he couldn't sleep. He developed fever. He hasn't been able to swallow for weeks, there is a problem with his swallow and he is hoarse. Started with food dysphagia, some pills he can no longer take, now having liquid dysphagia. He vomits with small amounts of eating with thick white mucus. He got a bit less responsive overnight. He feels better already with the IVF. Dysphagia started about 2 months after neck surgery, and this appears to unrelated. He was scheduled for a swllow test next Wednesday and EGD 12/30.    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 hemodynamically stable.  -No changes, continues improving, cont iv antibiotics, blood cultures: NGTD. Speech eval  -Advancing diet slowly  Sepsis. SIRS criteria in this patient includes: Leukocytosis,tachypnea, hypoxia,tachycardia .  -improving, cont as above. COVID/respiratory panel negative  Dysphagia. Patient with progressive dysphagia - initially with solids and progressive to liquids -underwent EGD (12/12): found to have esophageal stenosis. S/p dilatation. Cont PPI. Diet advance today  CML.   Continue with Imantinib   CAD. Continue Imdur, Crestor  DM. He does not appear to  be taking medication for this issue at this time -Recent A1c was 5.8 on 9/28  H/o PE on [facility]. Holding Eliquis periprocedure. Resume post procedure 48 hrs on 12/14. No recent h/o VTE (d/w patient, his family. Last VTE 2013)  HTN. He does not appear to be taking medications for this issue at this time  Myasthenia gravis. Continue physostigmine  DVT prophylaxis: SCD/Compression stockings  Code Status: DNR    Code Status Orders  (From admission, onward)         Start     Ordered   02/02/2019 1220  Do not attempt resuscitation (DNR)  Continuous    Question Answer Comment  In the event of cardiac or respiratory ARREST Do not call a "code blue"   In the event of cardiac or respiratory ARREST Do not perform Intubation, CPR, defibrillation or ACLS   In the event of cardiac or respiratory ARREST Use medication by any route, position, wound care, and other measures to relive pain and suffering. May use oxygen, suction and manual treatment of airway obstruction as needed for comfort.      01/13/2019 1221        Code Status History    Date Active Date Inactive Code Status Order ID Comments User Context   11/11/2018 1555 11/23/2018 1421 Full Code [id number]  Elizabeth Sauer Inpatient   11/11/2018 1555 11/11/2018 1555 Full Code [id number]  Elizabeth Sauer Inpatient   11/09/2018 2022 11/11/2018 1549 Full Code [id number]  [name], MD Inpatient   02/10/2018 1636 02/19/2018 1435 Full Code [id number]  Samuella Cota, MD Inpatient   02/10/2018 1636 02/10/2018 1636 Full  Code [id number]  Flora Lipps Inpatient   02/05/2018 1730 02/10/2018 1633 Full Code [id number]  Sid Falcon, MD Inpatient   12/24/2017 0203 12/28/2017 1927 Full Code [id number]  Orbie Hurst Inpatient   10/31/2017 1018 11/06/2017 1716 DNR [id number]  Domenic Polite, MD Inpatient   10/30/2017 2239 10/31/2017 1018 Full Code [id number]  Elwyn Reach, MD Inpatient   07/23/2017 1716 07/30/2017 1408 Full Code  [id number]  Flora Lipps Inpatient   07/19/2017 1645 07/23/2017 1704 Full Code [id number]  Rondel Jumbo, PA-C ED   11/18/2016 1609 11/19/2016 1729 Full Code [id number]  [name], MD Inpatient   07/02/2016 0318 07/03/2016 1745 Full Code [id number]  Rise Patience, MD Inpatient   03/11/2016 1558 03/12/2016 1409 Full Code [id number]  [name], MD Inpatient   06/24/2015 1832 06/29/2015 1126 Full Code [id number]  Flora Lipps Inpatient   03/07/2015 1819 03/08/2015 1659 DNR [id number]  Samella Parr, NP Inpatient   06/18/2013 0437 06/20/2013 1427 Full Code [id number]  Berle Mull, MD ED   06/07/2012 1543 06/10/2012 1412 DNR [id number]  Eugenie Filler, MD Inpatient   07/30/2011 2100 08/01/2011 1457 Full Code [id number]  Theressa Millard, MD ED   Advance Care Planning Activity     Family Communication: NONE TODAY  Disposition Plan:   Patient remained inpatient as we advance diet.  Continue treatments with IV antibiotics for aspiration pneumonia Consults called: None Admission status: Inpatient   Consultants:   GI  Procedures:  DG Abdomen 1 View  Result Date: 01/20/2019 CLINICAL DATA:  Abdominal swelling, fever EXAM: ABDOMEN - 1 VIEW COMPARISON:  06/17/2015 FINDINGS: Nonspecific bowel gas pattern without evidence of obstruction. No gross free intraperitoneal air. No radio-opaque calculi or other significant radiographic abnormality are seen. IMPRESSION: Nonobstructive bowel gas pattern. Electronically Signed   By: Davina Poke M.D.   On: 02/04/2019 09:48   CT SOFT TISSUE NECK WO CONTRAST  Result Date: 01/14/2019 CLINICAL DATA:  78 year old male with difficulty swallowing for 2 weeks. Prior cervical spine surgery. 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 EXAM: CT NECK WITHOUT CONTRAST TECHNIQUE: Multidetector CT imaging of the neck was performed following the standard protocol without intravenous contrast. COMPARISON:  CTA head and neck 07/19/2017. FINDINGS: Pharynx  and larynx: Negative noncontrast larynx and pharynx. Parapharyngeal and retropharyngeal spaces appear normal. Salivary glands: Negative sublingual space. Punctate left submandibular gland sialolithiasis (series 3, image 51) seems new since 2019. Otherwise negative noncontrast submandibular glands, no evidence of inflammation or obstruction. Negative noncontrast parotid glands. Thyroid: Negative. Lymph nodes: Negative, no lymphadenopathy. Interval posttraumatic or postoperative changes to the anterior right lower neck on series 3, image 66 with mild regional stranding and soft tissue architectural distortion. This is near the carotid space, but no other postoperative changes to that space are evident, and this is probably related to interval cervical spine surgery. Vascular: Chronic extensive carotid atherosclerosis, most pronounced at the right carotid bifurcation and both ICA siphons. Similar distal vertebral artery calcified plaque. Vascular patency is not evaluated in the absence of IV contrast. Limited intracranial: Small chronic infarct in the left cerebellum PICA territory appears stable. Visualized orbits: Stable, negative. Mastoids and visualized paranasal sinuses: Chronic right maxillary sinusitis appears stable with some and spur 8 at secretions. Other visualized paranasal sinuses and mastoids are clear. Skeleton: Interval postoperative changes to the cervical spine C3 through C6. ACDF plus C5 corpectomy. No evidence of hardware loosening. Evidence of developing solid arthrodesis at those  levels. Residual facet arthropathy. Evidence of developing ankylosis also at C6-C7. Stable degenerative appearing anterolisthesis of C7 on T1 also with suggestion of developing posterior element ankylosis. No acute osseous abnormality identified. Upper chest: Calcified aortic atherosclerosis. Otherwise negative. IMPRESSION: 1. No acute findings in the neck. No explanation for acute difficulty swallowing. 2. Interval  postoperative changes to the cervical spine with no adverse features. 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 3. Punctate left submandibular gland sialolithiasis. 4. Chronic right maxillary sinusitis. 5. Carotid, vertebral, and Aortic atherosclerosis (ICD10-I70.0). Electronically Signed   By: Genevie Ann M.D.   On: 01/14/2019 14:23   DG Chest Port 1 View  Result Date: 02/07/2019 CLINICAL DATA:  Assess for obstruction. Fever and weakness with nausea and vomiting 2 weeks. EXAM: PORTABLE CHEST 1 VIEW COMPARISON:  10/30/2018 FINDINGS: Lungs are adequately inflated with airspace consolidation over the right base which may be due to pneumonia or atelectasis. No evidence of effusion. Borderline stable cardiomegaly. Remainder of the exam is unchanged. IMPRESSION: Opacification over the right base which may be due to atelectasis or pneumonia. Electronically Signed   By: Marin Olp M.D.   On: 01/24/2019 09:44     Antimicrobials:   UNASYN   Subjective: Reports he tolerated clear liquids without complication   Objective: Vitals:   01/12/2019 2104 01/22/19 0410 01/22/19 0902 01/22/19 1002  BP: (!) 120/41 (!) 116/50 (!) 137/56 (!) 130/53  Pulse: 82 78 82 85  Resp: 15 16 18 14   Temp: (!) 97.4 F (36.3 C) 98 F (36.7 C)    TempSrc: Oral Oral    SpO2: 95% 92% 91% 92%  Weight:  72.1 kg    Height:        Intake/Output Summary (Last 24 hours) at 01/22/2019 1347 Last data filed at 01/22/2019 [id number] Gross per 24 hour  Intake 250 ml  Output 150 ml  Net 100 ml   Filed Weights   01/11/2019 0334 01/31/2019 0934 01/22/19 0410  Weight: 69.5 kg 69.5 kg 72.1 kg    Examination:  General exam: Appears calm and comfortable  Respiratory system: Mild rhonchi, upper airway secretions noted also Cardiovascular system: S1 & S2 heard, RRR. No JVD, murmurs, rubs, gallops or clicks. No pedal edema. Gastrointestinal system: Abdomen is  nondistended, soft and nontender. No organomegaly or masses felt. Normal bowel sounds heard. Central nervous system: Alert and oriented. No focal neurological deficits. Extremities: Warm well perfused, neurovascularly intact Skin: No rashes, lesions or ulcers Psychiatry: Judgement and insight appear normal. Mood & affect appropriate.     Data Reviewed: I have personally reviewed following labs and imaging studies  CBC: Recent Labs  Lab 02/03/2019 0800 01/20/19 0155 02/08/2019 0315 01/22/19 0340  WBC 10.9* 9.5 8.1 6.8  NEUTROABS 7.5 6.5  --   --   HGB 10.2* 9.1* 8.4* 8.4*  HCT 31.0* 27.8* 25.8* 25.9*  MCV 95.7 96.9 98.9 98.9  PLT 234 198 194 [id number]   Basic Metabolic Panel: Recent Labs  Lab 01/27/2019 0800 01/20/19 0155 01/22/19 0340  NA 138 140 145  K 3.0* 3.4* 3.5  CL 100 108 113*  CO2 28 23 22   GLUCOSE 98 90 93  BUN 9 11 12   CREATININE 1.84* 1.64* 1.58*  CALCIUM 8.5* 8.0* 8.3*   GFR: Estimated Creatinine Clearance: 36.1 mL/min (A) (by C-G formula based on SCr of 1.58 mg/dL (H)). Liver Function Tests: Recent Labs  Lab 01/16/2019 0800  AST 45*  ALT 18  ALKPHOS 67  BILITOT 1.3*  PROT 5.2*  ALBUMIN 3.2*   Recent Labs  Lab 01/17/2019 0800  LIPASE 26   No results for input(s): AMMONIA in the last 168 hours. Coagulation Profile: Recent Labs  Lab 01/24/2019 0800  INR 1.2   Cardiac Enzymes: No results for input(s): CKTOTAL, CKMB, CKMBINDEX, TROPONINI in the last 168 hours. BNP (last 3 results) No results for input(s): PROBNP in the last 8760 hours. HbA1C: No results for input(s): HGBA1C in the last 72 hours. CBG: Recent Labs  Lab 01/11/2019 2050  GLUCAP 89   Lipid Profile: No results for input(s): CHOL, HDL, LDLCALC, TRIG, CHOLHDL, LDLDIRECT in the last 72 hours. Thyroid Function Tests: No results for input(s): TSH, T4TOTAL, FREET4, T3FREE, THYROIDAB in the last 72 hours. Anemia Panel: No results for input(s): VITAMINB12, FOLATE, FERRITIN, TIBC, IRON,  RETICCTPCT in the last 72 hours. Sepsis Labs: Recent Labs  Lab 01/30/2019 0800  PROCALCITON 2.90  LATICACIDVEN 1.8    Recent Results (from the past 240 hour(s))  Blood Culture (routine x 2)     Status: None (Preliminary result)   Collection Time: 01/20/2019  7:40 AM   Specimen: BLOOD  Result Value Ref Range Status   Specimen Description BLOOD RIGHT ANTECUBITAL  Final   Special Requests   Final    BOTTLES DRAWN AEROBIC ONLY Blood Culture adequate volume   Culture   Final    NO GROWTH 3 DAYS Performed at [facility], [address], [place], [place] [address]    Report Status PENDING  Incomplete  Respiratory Panel by RT PCR (Flu A&B, Covid) - Nasopharyngeal Swab     Status: None   Collection Time: 01/23/2019 11:54 AM   Specimen: Nasopharyngeal Swab  Result Value Ref Range Status   SARS Coronavirus 2 by RT PCR NEGATIVE NEGATIVE Final    Comment: (NOTE) SARS-CoV-2 target nucleic acids are NOT DETECTED. The SARS-CoV-2 RNA is generally detectable in upper respiratoy specimens during the acute phase of infection. The lowest concentration of SARS-CoV-2 viral copies this assay can detect is 131 copies/mL. A negative result does not preclude SARS-Cov-2 infection and should not be used as the sole basis for treatment or other patient management decisions. A negative result may occur with  improper specimen collection/handling, submission of specimen other than nasopharyngeal swab, presence of viral mutation(s) within the areas targeted by this assay, and inadequate number of viral copies (<131 copies/mL). A negative result must be combined with clinical observations, patient history, and epidemiological information. The expected result is Negative. Fact Sheet for Patients:  PinkCheek.be Fact Sheet for Healthcare Providers:  GravelBags.it This test is not yet ap proved or cleared by the [place] FDA and  has been  authorized for detection and/or diagnosis of SARS-CoV-2 by FDA under an Emergency Use Authorization (EUA). This EUA will remain  in effect (meaning this test can be used) for the duration of the COVID-19 declaration under Section 564(b)(1) of the Act, 21 U.S.C. section 360bbb-3(b)(1), unless the authorization is terminated or revoked sooner.    Influenza A by PCR NEGATIVE NEGATIVE Final   Influenza B by PCR NEGATIVE NEGATIVE Final    Comment: (NOTE) The Xpert Xpress SARS-CoV-2/FLU/RSV assay is intended as an aid in  the diagnosis of influenza from Nasopharyngeal swab specimens and  should not be used as a sole basis for treatment. Nasal washings and  aspirates are unacceptable for Xpert Xpress SARS-CoV-2/FLU/RSV  testing. Fact Sheet for Patients: PinkCheek.be Fact Sheet  for Healthcare Providers: GravelBags.it This test is not yet approved or cleared by the [place] and  has been authorized for detection and/or diagnosis of SARS-CoV-2 by  FDA under an Emergency Use Authorization (EUA). This EUA will remain  in effect (meaning this test can be used) for the duration of the  Covid-19 declaration under Section 564(b)(1) of the Act, 21  U.S.C. section 360bbb-3(b)(1), unless the authorization is  terminated or revoked. Performed at [facility], [place] [address], [place], [place] [address]   Blood Culture (routine x 2)     Status: None (Preliminary result)   Collection Time: 01/10/2019  5:40 PM   Specimen: BLOOD LEFT ARM  Result Value Ref Range Status   Specimen Description BLOOD LEFT ARM  Final   Special Requests   Final    BOTTLES DRAWN AEROBIC AND ANAEROBIC Blood Culture results may not be optimal due to an inadequate volume of blood received in culture bottles   Culture   Final    NO GROWTH 3 DAYS Performed at [facility], [place] [address] [place], [place] [address]    Report Status PENDING  Incomplete    Surgical PCR screen     Status: None   Collection Time: 01/13/2019  8:55 AM   Specimen: Nasal Mucosa; Nasal Swab  Result Value Ref Range Status   MRSA, PCR NEGATIVE NEGATIVE Final   Staphylococcus aureus NEGATIVE NEGATIVE Final    Comment: (NOTE) The Xpert SA Assay (FDA approved for NASAL specimens in patients 87 years of age and older), is one component of a comprehensive surveillance program. It is not intended to diagnose infection nor to guide or monitor treatment. Performed at [facility], [place] [address], [place], [place] [address]          Radiology Studies: No results found.      Scheduled Meds: . brimonidine  1 drop Both Eyes BID  . gabapentin  300 mg Oral TID  . imatinib  300 mg Oral Q lunch  . isosorbide mononitrate  30 mg Oral Daily  . mupirocin ointment  1 application Nasal BID  . pyridostigmine  60 mg Oral Daily  . rosuvastatin  5 mg Oral Daily  . timolol  1 drop Both Eyes BID   Continuous Infusions: . ampicillin-sulbactam (UNASYN) IV 3 g (01/22/19 [id number])     LOS: 3 days    Time spent: [place], MD [facility]  If 7PM-7AM, please contact night-coverage  01/22/2019, 1:47 PM

## 2019-01-23 ENCOUNTER — Inpatient Hospital Stay (HOSPITAL_COMMUNITY): Payer: Medicare Other

## 2019-01-23 DIAGNOSIS — J189 Pneumonia, unspecified organism: Secondary | ICD-10-CM

## 2019-01-23 DIAGNOSIS — R1310 Dysphagia, unspecified: Secondary | ICD-10-CM

## 2019-01-23 DIAGNOSIS — A419 Sepsis, unspecified organism: Principal | ICD-10-CM

## 2019-01-23 DIAGNOSIS — K222 Esophageal obstruction: Secondary | ICD-10-CM

## 2019-01-23 DIAGNOSIS — G7000 Myasthenia gravis without (acute) exacerbation: Secondary | ICD-10-CM

## 2019-01-23 LAB — CBC
HCT: 26 % — ABNORMAL LOW (ref 39.0–52.0)
Hemoglobin: 8.7 g/dL — ABNORMAL LOW (ref 13.0–17.0)
MCH: 32.1 pg (ref 26.0–34.0)
MCHC: 33.5 g/dL (ref 30.0–36.0)
MCV: 95.9 fL (ref 80.0–100.0)
Platelets: 195 10*3/uL (ref 150–400)
RBC: 2.71 MIL/uL — ABNORMAL LOW (ref 4.22–5.81)
RDW: 14.8 % (ref 11.5–15.5)
WBC: 5.6 10*3/uL (ref 4.0–10.5)
nRBC: 0 % (ref 0.0–0.2)

## 2019-01-23 MED ORDER — APIXABAN 2.5 MG PO TABS
2.5000 mg | ORAL_TABLET | Freq: Two times a day (BID) | ORAL | Status: DC
  Administered 2019-01-23: 2.5 mg via ORAL
  Filled 2019-01-23: qty 1

## 2019-01-23 MED ORDER — PYRIDOSTIGMINE BROMIDE 10 MG/2ML IV SOLN
1.0000 mg | Freq: Two times a day (BID) | INTRAVENOUS | Status: DC
  Administered 2019-01-24 – 2019-01-25 (×4): 1 mg via INTRAVENOUS
  Filled 2019-01-23 (×7): qty 0.2

## 2019-01-23 MED ORDER — SODIUM CHLORIDE 0.9 % IV SOLN
INTRAVENOUS | Status: DC
  Administered 2019-01-23: 12:00:00 via INTRAVENOUS

## 2019-01-23 MED ORDER — PANTOPRAZOLE SODIUM 40 MG PO TBEC: 40.0000 mg | DELAYED_RELEASE_TABLET | Freq: Two times a day (BID) | ORAL | Status: DC

## 2019-01-23 MED ORDER — SUCRALFATE 1 GM/10ML PO SUSP
1.0000 g | Freq: Three times a day (TID) | ORAL | Status: DC
  Filled 2019-01-23 (×2): qty 10

## 2019-01-23 MED ORDER — ONDANSETRON HCL 4 MG/2ML IJ SOLN
4.0000 mg | Freq: Four times a day (QID) | INTRAMUSCULAR | Status: DC | PRN
  Administered 2019-01-24: 4 mg via INTRAVENOUS
  Filled 2019-01-23 (×3): qty 2

## 2019-01-23 MED ORDER — ENOXAPARIN SODIUM 80 MG/0.8ML ~~LOC~~ SOLN
1.0000 mg/kg | Freq: Two times a day (BID) | SUBCUTANEOUS | Status: DC
  Administered 2019-01-23 – 2019-01-25 (×4): 75 mg via SUBCUTANEOUS
  Filled 2019-01-23 (×4): qty 0.8

## 2019-01-23 MED ORDER — PANTOPRAZOLE SODIUM 40 MG IV SOLR
40.0000 mg | Freq: Two times a day (BID) | INTRAVENOUS | Status: DC
  Administered 2019-01-23 – 2019-01-24 (×3): 40 mg via INTRAVENOUS
  Filled 2019-01-23 (×3): qty 40

## 2019-01-23 NOTE — Discharge Instructions (Signed)

## 2019-01-23 NOTE — Progress Notes (Addendum)
PROGRESS NOTE    [name]  [id number]  DOB: 19-Jun-1940  PCP: Libby Maw, MD Admit date:01/17/2019 Hospital course: 78 y.o.malewith medical history significant ofCVA; PE; pituitary tumor; PVDs/p R AKA; myasthenia gravis; HLD; glaucoma; HTN; DVT; DM; CAD; CKD; and chronic back pain presented to ED with vomiting/ fever.He reported dysphagia for weeks- Started with food dysphagia, some pills he can no longer take, now having liquid dysphagia. He vomits with small amounts of eating with thick white mucus. He got a bit less responsive during hospital stay-improved with the IVF. Dysphagia started about 2 months after neck surgery.He was scheduled for a swllow test next Wednesday and EGD 12/30.  Patient admitted with IV abx for aspiration pneumonia and GI consulted for dysphagia. 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 S/p dilatation. Cont PPI. Diet advanced as tolerated. HoldingEliquisperiprocedure. Resume post procedure48 hrs on 12/14 for h/o PE. On Physostigmine for MG.  Subjective:  Patient still having significant dysphagia with discomfort in mid chest area even with liquid intake.  Seen by speech therapy for follow-up today and recommended barium swallow.  Wife at bedside.  Objective: Vitals:   01/22/19 1002 01/22/19 2135 01/23/19 0448 01/23/19 1411  BP: (!) 130/53 139/60 (!) 155/70 (!) 145/47  Pulse: 85 84 84 90  Resp: 14 16 13 20   Temp:  98.5 F (36.9 C) 98.7 F (37.1 C) 98.4 F (36.9 C)  TempSrc:  Axillary Oral Oral  SpO2: 92% 93% 96% 94%  Weight:   73.1 kg   Height:        Intake/Output Summary (Last 24 hours) at 01/23/2019 1413 Last data filed at 01/23/2019 [id number] Gross per 24 hour  Intake 733.47 ml  Output 520 ml  Net 213.47 ml   Filed Weights   01/31/2019 0934 01/22/19 0410 01/23/19 0448  Weight: 69.5 kg 72.1 kg 73.1 kg    Physical Examination:  General exam: Appears calm and comfortable  Respiratory system: Clear to  auscultation. Respiratory effort normal. Cardiovascular system: S1 & S2 heard, RRR. No JVD, murmurs, rubs, gallops or clicks. No pedal edema. Gastrointestinal system: Abdomen is nondistended, soft and nontender. Normal bowel sounds heard. Central nervous system: Alert and oriented. No new focal neurological deficits. Extremities: No contractures, edema or joint deformities.  Skin: No rashes, lesions or ulcers Psychiatry: Judgement and insight appear normal. Mood & affect appropriate.   Data Reviewed: I have personally reviewed following labs and imaging studies  CBC: Recent Labs  Lab 01/26/2019 0800 01/20/19 0155 01/11/2019 0315 01/22/19 0340 01/23/19 0444  WBC 10.9* 9.5 8.1 6.8 5.6  NEUTROABS 7.5 6.5  --   --   --   HGB 10.2* 9.1* 8.4* 8.4* 8.7*  HCT 31.0* 27.8* 25.8* 25.9* 26.0*  MCV 95.7 96.9 98.9 98.9 95.9  PLT 234 198 194 185 0000000   Basic Metabolic Panel: Recent Labs  Lab 02/09/2019 0800 01/20/19 0155 01/22/19 0340  NA 138 140 145  K 3.0* 3.4* 3.5  CL 100 108 113*  CO2 28 23 22   GLUCOSE 98 90 93  BUN 9 11 12   CREATININE 1.84* 1.64* 1.58*  CALCIUM 8.5* 8.0* 8.3*   GFR: Estimated Creatinine Clearance: 36.1 mL/min (A) (by C-G formula based on SCr of 1.58 mg/dL (H)). Liver Function Tests: Recent Labs  Lab 02/05/2019 0800  AST 45*  ALT 18  ALKPHOS 67  BILITOT 1.3*  PROT 5.2*  ALBUMIN 3.2*   Recent Labs  Lab 01/14/2019 0800  LIPASE 26   No results for input(s): AMMONIA  in the last 168 hours. Coagulation Profile: Recent Labs  Lab 01/24/2019 0800  INR 1.2   Cardiac Enzymes: No results for input(s): CKTOTAL, CKMB, CKMBINDEX, TROPONINI in the last 168 hours. BNP (last 3 results) No results for input(s): PROBNP in the last 8760 hours. HbA1C: No results for input(s): HGBA1C in the last 72 hours. CBG: Recent Labs  Lab 02/03/2019 2050  GLUCAP 89   Lipid Profile: No results for input(s): CHOL, HDL, LDLCALC, TRIG, CHOLHDL, LDLDIRECT in the last 72 hours. Thyroid  Function Tests: No results for input(s): TSH, T4TOTAL, FREET4, T3FREE, THYROIDAB in the last 72 hours. Anemia Panel: No results for input(s): VITAMINB12, FOLATE, FERRITIN, TIBC, IRON, RETICCTPCT in the last 72 hours. Sepsis Labs: Recent Labs  Lab 02/04/2019 0800  PROCALCITON 2.90  LATICACIDVEN 1.8    Recent Results (from the past 240 hour(s))  Blood Culture (routine x 2)     Status: None (Preliminary result)   Collection Time: 01/14/2019  7:40 AM   Specimen: BLOOD  Result Value Ref Range Status   Specimen Description BLOOD RIGHT ANTECUBITAL  Final   Special Requests   Final    BOTTLES DRAWN AEROBIC ONLY Blood Culture adequate volume   Culture   Final    NO GROWTH 4 DAYS Performed at [facility], [address], [place], [place] [address]    Report Status PENDING  Incomplete  Respiratory Panel by RT PCR (Flu A&B, Covid) - Nasopharyngeal Swab     Status: None   Collection Time: 01/22/2019 11:54 AM   Specimen: Nasopharyngeal Swab  Result Value Ref Range Status   SARS Coronavirus 2 by RT PCR NEGATIVE NEGATIVE Final    Comment: (NOTE) SARS-CoV-2 target nucleic acids are NOT DETECTED. The SARS-CoV-2 RNA is generally detectable in upper respiratoy specimens during the acute phase of infection. The lowest concentration of SARS-CoV-2 viral copies this assay can detect is 131 copies/mL. A negative result does not preclude SARS-Cov-2 infection and should not be used as the sole basis for treatment or other patient management decisions. A negative result may occur with  improper specimen collection/handling, submission of specimen other than nasopharyngeal swab, presence of viral mutation(s) within the areas targeted by this assay, and inadequate number of viral copies (<131 copies/mL). A negative result must be combined with clinical observations, patient history, and epidemiological information. The expected result is Negative. Fact Sheet for Patients:    PinkCheek.be Fact Sheet for Healthcare Providers:  GravelBags.it This test is not yet ap proved or cleared by the [place] FDA and  has been authorized for detection and/or diagnosis of SARS-CoV-2 by FDA under an Emergency Use Authorization (EUA). This EUA will remain  in effect (meaning this test can be used) for the duration of the COVID-19 declaration under Section 564(b)(1) of the Act, 21 U.S.C. section 360bbb-3(b)(1), unless the authorization is terminated or revoked sooner.    Influenza A by PCR NEGATIVE NEGATIVE Final   Influenza B by PCR NEGATIVE NEGATIVE Final    Comment: (NOTE) The Xpert Xpress SARS-CoV-2/FLU/RSV assay is intended as an aid in  the diagnosis of influenza from Nasopharyngeal swab specimens and  should not be used as a sole basis for treatment. Nasal washings and  aspirates are unacceptable for Xpert Xpress SARS-CoV-2/FLU/RSV  testing. Fact Sheet for Patients: PinkCheek.be Fact Sheet for Healthcare Providers: GravelBags.it This test is not yet approved or cleared by the [place] FDA and  has been authorized for detection and/or diagnosis of SARS-CoV-2 by  FDA  under an Emergency Use Authorization (EUA). This EUA will remain  in effect (meaning this test can be used) for the duration of the  Covid-19 declaration under Section 564(b)(1) of the Act, 21  U.S.C. section 360bbb-3(b)(1), unless the authorization is  terminated or revoked. Performed at [facility], [place] [address] [place], [place] [address]   Blood Culture (routine x 2)     Status: None (Preliminary result)   Collection Time: 02/03/2019  5:40 PM   Specimen: BLOOD LEFT ARM  Result Value Ref Range Status   Specimen Description BLOOD LEFT ARM  Final   Special Requests   Final    BOTTLES DRAWN AEROBIC AND ANAEROBIC Blood Culture results may not be optimal due to an  inadequate volume of blood received in culture bottles   Culture   Final    NO GROWTH 4 DAYS Performed at [facility], [place] [address], [place], [place] [address]    Report Status PENDING  Incomplete  Surgical PCR screen     Status: None   Collection Time: 01/16/2019  8:55 AM   Specimen: Nasal Mucosa; Nasal Swab  Result Value Ref Range Status   MRSA, PCR NEGATIVE NEGATIVE Final   Staphylococcus aureus NEGATIVE NEGATIVE Final    Comment: (NOTE) The Xpert SA Assay (FDA approved for NASAL specimens in patients 25 years of age and older), is one component of a comprehensive surveillance program. It is not intended to diagnose infection nor to guide or monitor treatment. Performed at [facility], [place] [address] [place], [place] [address]       Radiology Studies: DG ESOPHAGUS W SINGLE CM (SOL OR THIN BA)  Result Date: 01/23/2019 CLINICAL DATA:  Difficulty swallowing. EXAM: ESOPHOGRAM/BARIUM SWALLOW TECHNIQUE: Single contrast examination was performed using  thin barium. FLUOROSCOPY TIME:  Fluoroscopy Time:  2 minutes and 48 seconds. Radiation Exposure Index (if provided by the fluoroscopic device): 25 mGy Number of Acquired Spot Images: COMPARISON:  None. FINDINGS: Study markedly limited by patient immobility and difficulty swallowing. Patient was placed in shallow oblique LPO position relative to the fluoro table with 30-45 degree head up positioning. Patient was able to take small sips of thin barium. Disruption of primary peristalsis noted on all swallows at the level of the mid esophagus. Tertiary contractions are visible in the middle and distal third of the esophagus without overt presbyesophagus. The barium did pass into the stomach after repeate "dry" swallows. No gross mass lesion or ulcer evident.  No diverticulum evident. IMPRESSION: Nonspecific esophageal motility disorder with absent primary peristalsis in the middle and distal third of the esophagus associated with mild  tertiary contractions. Assessment for stricture, mass lesion, mucosal ulceration is markedly limited by patient difficulty with swallowing, taking only piecemeal swallows. Electronically Signed   By: Misty Stanley M.D.   On: 01/23/2019 13:50        Scheduled Meds: . apixaban  2.5 mg Oral BID  . brimonidine  1 drop Both Eyes BID  . gabapentin  300 mg Oral TID  . imatinib  300 mg Oral Q lunch  . isosorbide mononitrate  30 mg Oral Daily  . mupirocin ointment  1 application Nasal BID  . pantoprazole  40 mg Oral BID  . pyridostigmine  60 mg Oral Daily  . rosuvastatin  5 mg Oral Daily  . timolol  1 drop Both Eyes BID   Continuous Infusions: . sodium chloride 75 mL/hr at 01/23/19 1213  . ampicillin-sulbactam (UNASYN) IV  3 g (01/23/19 0815)    Assessment & Plan:   Aspiration pneumonia with sepsis POA.  In the setting of dysphagia and vomiting. Leukocytosis,tachypnea, hypoxia,tachycardia on presentation.COVID/respiratory panel negative GI work-up revealed esophageal stricture on EGD as well as reduced peristalsis along mid esophagus on barium swallow with underlying history of myasthenia gravis.  Cont iv antibiotics,bloodcultures: NGTD, clinically improving.  Saturating well on room air.GI and speech eval  appreciated.  Post EGD was started on clear liquid diet but due to continued issues with swallowing, kept n.p.o. for today and I have requested GI to follow-up/recommend.  Ordered IV fluids.  Will transition medications to IV if possible.  Dysphagia. 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 S/p dilatation. Cont PPI twice daily for 8 weeks and avoid NSAIDs per GI recommendations.  Per GI okay to resume anticoagulation from a.m. but may need other procedures like PEG tube placement if dysmotility felt to be irreversible.  Will await GI follow-up recommendations before resuming oral  anticoagulation.  DM. He does not appear to be taking medication for this issue at this time -Recent A1c was 5.8 on 9/28  H/o PE on [facility]. HoldingEliquisperiprocedure. Resume post procedure48 hrs on 12/14. No recent h/o VTE (d/w patient, his family. Last VTE [id number] uncertainty of further plan and ongoing dysphagia, will request pharmacy to dose therapeutic Lovenox subcu  HTN. He does not appear to be taking medications for this issue at this time  CML.  Continue withImantinib   CAD. Continue Imdur, Crestor  Myasthenia gravis. Continue physostigmine    DVT prophylaxis: resume anticoagulation Code Status: DNR Family / Patient Communication: Discussed with patient and family member at bedside. Disposition Plan: Home when medically cleared.  Discussed with GI APP and speech therapy     LOS: 4 days    Time spent: 35 minutes    Guilford Shi, MD [facility] Pager [phone]  If 7PM-7AM, please contact night-coverage [email or web address] Password [email or web address] 01/23/2019, 2:13 PM

## 2019-01-23 NOTE — Plan of Care (Signed)
  Problem: Elimination: Goal: Will not experience complications related to bowel motility Outcome: Progressing Goal: Will not experience complications related to urinary retention Outcome: Progressing   Problem: Safety: Goal: Ability to remain free from injury will improve Outcome: Progressing   Problem: Skin Integrity: Goal: Risk for impaired skin integrity will decrease Outcome: Progressing   

## 2019-01-23 NOTE — Evaluation (Signed)
Clinical/Bedside Swallow Evaluation Patient Details  Name: [name] MRN: [id number] Date of Birth: 07/19/1940  Today's Date: 01/23/2019 Time: SLP Start Time (ACUTE ONLY): 1045 SLP Stop Time (ACUTE ONLY): 1118 SLP Time Calculation (min) (ACUTE ONLY): 33 min  Past Medical History:  Past Medical History:  Diagnosis Date  . Bruises easily    d/t being on Eliquis  . Carotid artery disease ([place])    a. Duplex 05/2014: 123456 RICA, 123456 LICA, elevated velocities in right subclavian artery, normal left subclavian artery..  . Chronic back pain    HNP  . CKD (chronic kidney disease)   . Claustrophobia    takes Ativan as needed  . CML (chronic myeloid leukemia) ([place])    takes Gleevec daily  . Coronary atherosclerosis of unspecified type of vessel, native or graft    a. cath in 2003 showed 10-20% LM, scattered 20% prox-mid LAD, 30% more distal LAD, 50-60% stenosis of LAD towards apex, 20% Cx, 30% dRCA, focal 95% stenosis in smaller of 2 branches of PDA, EF 60-65%.  . Diabetes mellitus ([place])    no longer- cancxelled by Dr Hale Bogus  . Diarrhea    takes Imodium daily as needed  . Diverticulosis of colon (without mention of hemorrhage)   . DJD (degenerative joint disease)   . DVT (deep venous thrombosis) ([place]) 07/2011  . Esophageal stricture   . Essential hypertension    takes Imdur and Lisinopril daily  . Gastric polyps    benign  . GERD (gastroesophageal reflux disease)    takes Nexium daily  . Glaucoma    uses eye drops  . History of blood transfusion `   leg amputation  . History of bronchitis    not sure when the last time  . History of colon polyps    benign  . History of kidney stones    litrotrispspy  . Insomnia    takes Melatonin nightly as needed  . Mixed hyperlipidemia    takes Crestor daily  . Myasthenia gravis ([place])    uses prednisone  . Osteoporosis 2016  . Peripheral edema    takes Lasix daily  . Peripheral vascular disease ([place])   . Pituitary tumor    checks Dr.Walden at [facility] checks it every Jan   . PONV (postoperative nausea and vomiting)   . Ptosis   . Pulmonary embolus ([place]) 2012  . Stroke ([place]) 06/2015   x 3- last one 2018- balance issuses   Past Surgical History:  Past Surgical History:  Procedure Laterality Date  . ABDOMINAL AORTOGRAM W/LOWER EXTREMITY N/A 11/18/2016   Procedure: ABDOMINAL AORTOGRAM W/LOWER EXTREMITY;  Surgeon: [name], MD;  Location: [facility];  Service: Cardiovascular;  Laterality: N/A;  . AMPUTATION Right 02/07/2018   Procedure: right leg AMPUTATION ABOVE KNEE;  Surgeon: [name], MD;  Location: [place];  Service: Vascular;  Laterality: Right;  . ANTERIOR CERVICAL DECOMP/DISCECTOMY FUSION N/A 11/09/2018   Procedure: CERVICAL THREE-FOUR ANTERIOR CERVICAL DECOMPRESSION/DISCECTOMY FUSION WITH CERVICAL FIVE CORPECTOMY;  Surgeon: [name], MD;  Location: [facility];  Service: Neurosurgery;  Laterality: N/A;  . AORTOGRAM Right 12/23/2017   Procedure: AORTOGRAM, RIGHT LOWER EXTREMITY ANGIOGRAM, RIGHT SUPERFICIAL FEMORAL ARTERY ANGIOPLASTY WITH STENT, RIGHT POPLITEAL ARTERY ANGIOPLASTY WITH STENT, LEFT GROIN CUTDOWN AND LEFT FEMORAL ARTERY REPAIR;  Surgeon: [name], MD;  Location: [facility];  Service: Vascular;  Laterality: Right;  . BACK SURGERY     x2, lumbar and cervical  . CARDIAC CATHETERIZATION    .  CARPAL TUNNEL RELEASE Left 09/23/2018   Procedure: CARPAL TUNNEL RELEASE;  Surgeon: [name], MD;  Location: [facility];  Service: Neurosurgery;  Laterality: Left;  CARPAL TUNNEL RELEASE  . CATARACT EXTRACTION Bilateral 12/12  . COLONOSCOPY    . ESOPHAGOGASTRODUODENOSCOPY ENDOSCOPY  04/2015  . IR GENERIC HISTORICAL  03/03/2016   IR IVC FILTER PLMT / S&I [name] GUID/MOD SED 03/03/2016 [name], MD [facility]  . IR GENERIC HISTORICAL  04/09/2016   IR RADIOLOGIST EVAL & MGMT 04/09/2016 [name], MD [facility]  . IR GENERIC HISTORICAL  04/30/2016   IR IVC FILTER  RETRIEVAL / S&I [name] GUID/MOD SED 04/30/2016 [name], MD [facility]INTER[facility] RAD  . LITHOTRIPSY    . LUMBAR LAMINECTOMY/DECOMPRESSION MICRODISCECTOMY Right 09/14/2012   Procedure: Right Lumbar three-four, four-five, Lumbar five-Sacral one decompressive laminectomy;  Surgeon: [name], MD;  Location: [place] [facility];  Service: Neurosurgery;  Laterality: Right;  . LUMBAR LAMINECTOMY/DECOMPRESSION MICRODISCECTOMY Left 03/11/2016   Procedure: Left Lumbar Two-ThreeMicrodiscectomy;  Surgeon: [name], MD;  Location: [place];  Service: Neurosurgery;  Laterality: Left;  . PERIPHERAL VASCULAR INTERVENTION  11/18/2016   Procedure: PERIPHERAL VASCULAR INTERVENTION;  Surgeon: [name], MD;  Location: [place];  Service: Cardiovascular;;  Left popliteal  . SHOULDER SURGERY Left 05/07/2009  . TONSILLECTOMY    . ULNAR NERVE TRANSPOSITION Left 09/23/2018   Procedure: ULNAR NERVE DECOMPRESSION/TRANSPOSITION;  Surgeon: [name], MD;  Location: [place];  Service: Neurosurgery;  Laterality: Left;  ULNAR NERVE DECOMPRESSION/TRANSPOSITION   HPI:  78yo male admitted 01/30/2019 with sepsis/PNA (fever, N/V). PMH: cerebellar CVA, PE, pituitary tumor, PVD, R AKA, myasthenia gravis (at least 5 years ago), HLD, glaucoma, HTN, DVT, DM, CAD, CKD, chronic back pain. Dys2 started after neck surgery. Chronic myeloid leukemia, gastritis, esophageal stricture/stenosis, s/p dilation. CXR = opacification over RLL, atelectasis vs PNA   Assessment / Plan / Recommendation Clinical Impression  Pt presents with adequate oral motor strength and function, adequate natural dentition. Pt accepted trials of ice chips, thin liquid, and puree textures. No obvious oral issues observed or reported. Pt exhibited adequate laryngeal elevation per palpation, and no immediate cough response. Within a minute pt started to bring up residue/mucus and suctioned it from the oral cavity. Delayed wet voice quality also noted. This raises  suspicion for primary esophageal dysphagia. Recommend proceeding with regular barium swallow to evaluate esophageal motility. SLP will follow up after results are available, to provide education and determine if MBS is needed.   SLP Visit Diagnosis: Dysphagia, unspecified (R13.10)    Aspiration Risk  Moderate aspiration risk    Diet Recommendation NPO   Medication Administration: Crushed with puree    Other  Recommendations Recommended Consults: Consider esophageal assessment Oral Care Recommendations: Oral care QID   Follow up Recommendations Other (comment)(TBD)      Frequency and Duration min 1 x/week  1 week;2 weeks       Prognosis Prognosis for Safe Diet Advancement: Fair      Swallow Study   General Date of Onset: 01/28/2019 HPI: 78yo male admitted 02/08/2019 with sepsis/PNA (fever, N/V). PMH: cerebellar CVA, PE, pituitary tumor, PVD, R AKA, myasthenia gravis (at least 5 years ago), HLD, glaucoma, HTN, DVT, DM, CAD, CKD, chronic back pain. Dys2 started after neck surgery. Chronic myeloid leukemia, gastritis, esophageal stricture/stenosis, s/p dilation. CXR = opacification over RLL, atelectasis vs PNA Type of Study: Bedside Swallow Evaluation Previous Swallow Assessment: 2015 - recommended regular/thin liquids Diet Prior to  this Study: NPO Temperature Spikes Noted: No Respiratory Status: Room air History of Recent Intubation: No Behavior/Cognition: Alert;Cooperative;Pleasant mood Oral Cavity Assessment: Within Functional Limits Oral Care Completed by SLP: No Oral Cavity - Dentition: Adequate natural dentition Vision: Functional for self-feeding Self-Feeding Abilities: Needs assist Patient Positioning: Upright in bed Baseline Vocal Quality: Wet Volitional Cough: Weak Volitional Swallow: Able to elicit    Oral/Motor/Sensory Function Overall Oral Motor/Sensory Function: Within functional limits   Ice Chips Ice chips: Impaired Presentation: Spoon Pharyngeal Phase  Impairments: Multiple swallows(regurgitation)   Thin Liquid Thin Liquid: Impaired Pharyngeal  Phase Impairments: Multiple swallows(regurgitation)    Nectar Thick Nectar Thick Liquid: Not tested   Honey Thick Honey Thick Liquid: Not tested   Puree Puree: Impaired Presentation: Spoon Pharyngeal Phase Impairments: Multiple swallows(regurgitation)   Solid     Solid: Not tested     Carmela Rima, [place] Pathologist Office: [phone] Pager: [phone]  Shonna Chock 01/23/2019,11:36 AM

## 2019-01-23 NOTE — Progress Notes (Signed)
[facility] Progress Note  CC:  Dysphagia  Subjective:  Called back for ongoing dysphagia.  Cannot even tolerate ice chips.  EGD on 12/12 showed the following:  - Benign-appearing esophageal stenosis. Dilated. - Multiple gastric polyps. Biopsied. - Gastritis. Biopsied. - Erythematous duodenopathy.  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   Objective:  Vital signs in last 24 hours: Temp:  [98.4 F (36.9 C)-98.7 F (37.1 C)] 98.4 F (36.9 C) (12/14 1411) Pulse Rate:  [84-90] 90 (12/14 1411) Resp:  [13-20] 20 (12/14 1411) BP: (139-155)/(47-70) 145/47 (12/14 1411) SpO2:  [93 %-96 %] 94 % (12/14 1411) Weight:  [73.1 kg] 73.1 kg (12/14 0448)   General:  Alert, Well-developed, in NAD Heart:  Regular rate and rhythm; no murmurs Pulm:  CTAB.  No increased WOB. Abdomen:  Soft, non-distended.  BS present.  Non-tender. Extremities:  Without edema. Neurologic:  Alert and oriented x 4;  grossly normal neurologically. Psych:  Alert and cooperative.  Discouraged.  Intake/Output from previous day: 12/13 0701 - 12/14 0700 In: 813.5 [P.O.:360; IV Piggyback:453.5] Out: 320 [Urine:320] Intake/Output this shift: Total I/O In: 10 [P.O.:10] Out: 200 [Urine:200]  Lab Results: Recent Labs    01/27/2019 0315 01/22/19 0340 01/23/19 0444  WBC 8.1 6.8 5.6  HGB 8.4* 8.4* 8.7*  HCT 25.8* 25.9* 26.0*  PLT 194 185 195   BMET Recent Labs    01/22/19 0340  NA 145  K 3.5  CL 113*  CO2 22  GLUCOSE 93  BUN 12  CREATININE 1.58*  CALCIUM 8.3*   DG ESOPHAGUS W SINGLE CM (SOL OR THIN BA)  Result Date: 01/23/2019 CLINICAL DATA:  Difficulty swallowing. EXAM: ESOPHOGRAM/BARIUM SWALLOW TECHNIQUE: Single contrast examination was performed using  thin barium. FLUOROSCOPY TIME:  Fluoroscopy Time:  2 minutes and  48 seconds. Radiation Exposure Index (if provided by the fluoroscopic device): 25 mGy Number of Acquired Spot Images: COMPARISON:  None. FINDINGS: Study markedly limited by patient immobility and difficulty swallowing. Patient was placed in shallow oblique LPO position relative to the fluoro table with 30-45 degree head up positioning. Patient was able to take small sips of thin barium. Disruption of primary peristalsis noted on all swallows at the level of the mid esophagus. Tertiary contractions are visible in the middle and distal third of the esophagus without overt presbyesophagus. The barium did pass into the stomach after repeate "dry" swallows. No gross mass lesion or ulcer evident.  No diverticulum evident. 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 Assessment for stricture, mass lesion, mucosal ulceration is markedly limited by patient difficulty with swallowing, taking only piecemeal swallows. Electronically Signed   By: Misty Stanley M.D.   On: 01/23/2019 13:50   Assessment / Plan: *Dysphagia:  EGD several years ago failed to demonstrate esophageal stricture but there was esophageal dysmotility on barium esophagram.  Dysphagia started occurring about 2 weeks or so after his cervical spine surgery and CT scan of the neck 6 days ago revealed sialolithiasisand chronic right maxillary sinusitis but no swelling in the surgical region to suggest postsurgical cause to the dysphagia.  EGD on 12/12 with stricture that was dilated to 18 mm with balloon, but patient continues to have dysphagia, cannot even tolerate liquids.  Esophagram showed absent primary peristalsis in the  middle and distal third of the esophagus.  Cannot even tolerate ice chips.  ? Neuromuscular issue.  *Anemia, chronic, normocytic in patient with chronic myeloid leukemia.  *History DVT, carotid artery  disease, stroke. On chronic Eliquis. Last dose of this was taken in the morning of 12/9.  Then on IV heparin here, now on Lovenox.  *Right basilar pneumonia likely sequela of aspiration  *Myasthenia gravis, on Mestinon.Raises question of neurogenic dysphagia.  He also mentions issues with his voice as well.  Per Dr. Bryan Lemma, MG would affect striated muscle, so upper third of the esophagus, which is opposite of what he has.  *COVID-19 negative  -He needs esophageal manometry, but those are done as outpatient at [facility].  I do not think that this is anything that is going to get better in a day or two time so he will likely need a PEG for means of nutrition in the interim.  IR can be consulted for that.  ? If he needs a neuro eval.   LOS: 4 days   [name]. Kiko Ripp  01/23/2019, 3:36 PM

## 2019-01-23 NOTE — TOC Initial Note (Signed)
Transition of Care [facility]) - Initial/Assessment Note    Patient Details  Name: [name] MRN: [id number] Date of Birth: 1940/09/23  [facility] CM/SW Contact:    Bethena Roys, RN Phone Number: 01/23/2019, 12:37 PM  Clinical Narrative:  Pt presented for fever-sepsis due to pneumonia. Patient currently is on IV Unasyn. Patient is being transferred to [address] [place]. CM will continue to continue to follow for transition of care needs.                    Expected Discharge Plan: [place] Barriers to Discharge: Continued Medical Work up(IV Unasyn for Pneumonia and Sepsis.)   Expected Discharge Plan and Services Expected Discharge Plan: [place]   Discharge Planning Services: CM Consult Post Acute Care Choice: [place] arrangements for the past 2 months: Single Family Home                  Prior Living Arrangements/Services Living arrangements for the past 2 months: Single Family Home Lives with:: Spouse          Need for Family Participation in Patient Care: Yes (Comment) Care giver support system in place?: Yes (comment)   Criminal Activity/Legal Involvement Pertinent to Current Situation/Hospitalization: No - Comment as needed  Emotional Assessment Appearance:: Appears stated age       Alcohol / Substance Use: Not Applicable Psych Involvement: No (comment)  Admission diagnosis:  Sepsis due to pneumonia ([place]) [J18.9, A41.9] Dysphagia, unspecified type [R13.10] Patient Active Problem List   Diagnosis Date Noted  . Gastritis and gastroduodenitis   . Esophageal stricture   . Sepsis due to pneumonia ([place]) 01/24/2019  . Dysphagia 02/01/2019  . Neck muscle spasm 12/20/2018  . Urinary frequency 12/20/2018  . Fever 12/14/2018  . Urinary tract infection without hematuria 12/14/2018  . Benign essential HTN   . Chronic myeloid leukemia ([place])   . Labile blood pressure   . Acute on chronic anemia   .  Neuropathic pain   . Hypoalbuminemia due to protein-calorie malnutrition ([place])   . Steroid-induced hyperglycemia   . UTI due to Klebsiella species   . Cervical myelopathy ([place]) 11/11/2018  . S/P cervical spinal fusion 11/09/2018  . Neuropathic pain of right ankle 06/27/2018  . Insomnia due to medical condition 04/13/2018  . Atherosclerosis of native coronary artery of native heart without angina pectoris   . Hypokalemia   . Stage 3 chronic kidney disease   . 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 Acute blood loss anemia 02/08/2018  . Post-operative pain   . Unilateral AKA, right ([place])   . Ischemic foot 02/05/2018  . Ischemia of right lower extremity   . Pressure injury of skin 12/27/2017  . Arterial occlusive disease   . Postoperative pain   . CKD (chronic kidney disease), stage III   . History of CVA (cerebrovascular accident)   . 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 Septic arthritis of left sternoclavicular joint ([place]) 11/03/2017  . Cervical spinal cord compression ([place]) 10/30/2017  . Nasal abscess 09/25/2017  . Immunocompromised ([place]) 09/25/2017  . Acute on chronic blood loss anemia 08/05/2017  . Transient ischemic attack (TIA) 07/23/2017  . Subcortical infarction ([place])   . Right hemiparesis ([place])   . Ataxia 07/21/2017  . TIA (transient ischemic attack)   . Wheezing 06/17/2017  . Bursitis of left elbow 05/01/2017  . Dystonia 12/29/2016  . PAD (peripheral artery disease) ([place])  11/18/2016  . Osteoporosis 08/28/2016  . Paronychia of great toe, left 08/10/2016  . Carotid arterial disease ([place]) 07/09/2016  . Hypocalcemia 07/09/2016  . Diplopia 07/02/2016  . S/P lumbar laminectomy 03/11/2016  . Radicular pain of left lower extremity 02/21/2016  . Cough 01/10/2016  . Stroke due to embolism of vertebral artery ([place]) 06/24/2015  . Myasthenia gravis ([place])   . Diabetes mellitus type 2 in nonobese (HCC)   . Ataxia, post-stroke   . Gait disturbance, post-stroke   .  Proximal leg weakness   . Cervical spondylosis without myelopathy   . Achilles tendinitis of right lower extremity   . Chronic diastolic CHF (congestive heart failure) ([place])   . Gastroesophageal reflux disease without esophagitis   . 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 Cerebellar stroke ([place])   . Ischemic stroke ([place])   . Cerebrovascular accident (CVA) due to thrombosis of left vertebral artery ([place])   . History of pulmonary embolus (PE)   . Chronic anticoagulation   . HLD (hyperlipidemia)   . Neck pain 03/19/2015  . Pain in the chest   . Diabetes mellitus type II, controlled ([place])   . Diabetic gastroparesis ([place]) 03/07/2015  . Screening for osteoporosis 07/19/2014  . Dyspnea 04/09/2014  . Edema 04/09/2014  . Routine general medical examination at a health care facility 10/23/2013  . Bilateral carotid bruits 05/24/2013  . Abnormal CT scan, lung 05/24/2013  . Encounter for therapeutic drug monitoring 03/14/2013  . Lymphadenopathy, inguinal 01/11/2013  . AKI (acute kidney injury) ([place]) 10/28/2012  . 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 Myasthenia gravis with exacerbation ([place]) 03/31/2012  . Disease of salivary gland 12/09/2011  . Left facial pain 10/28/2011  . Pulmonary embolism ([place]) 10/25/2011  . 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 GERD with stricture 09/23/2011  . Long term (current) use of anticoagulants 08/03/2011  . History of pulmonary embolism 07/30/2011  . History of DVT (deep vein thrombosis) 07/30/2011  . Subconjunctival hemorrhage 06/22/2011  . Bilateral conjunctivitis 04/09/2011  . Diabetes mellitus type 2, controlled ([place]) 12/18/2010  . Hearing loss 08/04/2010  . Ptosis of eyelid 12/30/2009  . Essential hypertension 04/24/2009  . HYPERLIPIDEMIA, MIXED 10/05/2008  . HEMORRHOIDS-INTERNAL 07/02/2008  . HEMORRHOIDS-EXTERNAL 07/02/2008  . DIVERTICULOSIS-COLON 07/02/2008  . PERSONAL HX COLONIC POLYPS 07/02/2008  .  RASH-NONVESICULAR 02/07/2008  . PNEUMONIA 12/08/2007  . Chronic myeloid leukemia in remission ([place]) 07/07/2007  . CAD (coronary artery disease) 07/07/2007  . NEPHROLITHIASIS, HX OF 07/07/2007   PCP:  Libby Maw, MD Pharmacy:   [id number] - [place], [address] [address] Phone: [phone] Fax: [phone]     Social Determinants of Health (SDOH) Interventions    Readmission Risk Interventions Readmission Risk Prevention Plan 01/23/2019 11/11/2018  Transportation Screening Complete -  PCP or Specialist Appt within 3-5 Days - (No Data)  [place] or [place] - Complete  Social Work Consult for [place] Planning/Counseling - Complete  Palliative Care Screening - Not Applicable  Medication Review Press photographer) Complete Complete  Palliative Care Screening Not Applicable -  Some recent data might be hidden

## 2019-01-24 ENCOUNTER — Inpatient Hospital Stay (HOSPITAL_COMMUNITY): Payer: Medicare Other

## 2019-01-24 ENCOUNTER — Encounter: Payer: Self-pay | Admitting: Gastroenterology

## 2019-01-24 ENCOUNTER — Other Ambulatory Visit: Payer: Self-pay | Admitting: Physician Assistant

## 2019-01-24 DIAGNOSIS — R112 Nausea with vomiting, unspecified: Secondary | ICD-10-CM

## 2019-01-24 LAB — CULTURE, BLOOD (ROUTINE X 2)
Culture: NO GROWTH
Culture: NO GROWTH
Special Requests: ADEQUATE

## 2019-01-24 LAB — BASIC METABOLIC PANEL
Anion gap: 11 (ref 5–15)
BUN: 14 mg/dL (ref 8–23)
CO2: 21 mmol/L — ABNORMAL LOW (ref 22–32)
Calcium: 8.7 mg/dL — ABNORMAL LOW (ref 8.9–10.3)
Chloride: 116 mmol/L — ABNORMAL HIGH (ref 98–111)
Creatinine, Ser: 1.97 mg/dL — ABNORMAL HIGH (ref 0.61–1.24)
GFR calc Af Amer: 36 mL/min — ABNORMAL LOW (ref >60)
GFR calc non Af Amer: 31 mL/min — ABNORMAL LOW (ref >60)
Glucose, Bld: 102 mg/dL — ABNORMAL HIGH (ref 70–99)
Potassium: 3.5 mmol/L (ref 3.5–5.1)
Sodium: 148 mmol/L — ABNORMAL HIGH (ref 135–145)

## 2019-01-24 LAB — SURGICAL PATHOLOGY

## 2019-01-24 LAB — CBC
HCT: 28.1 % — ABNORMAL LOW (ref 39.0–52.0)
Hemoglobin: 9.2 g/dL — ABNORMAL LOW (ref 13.0–17.0)
MCH: 31.4 pg (ref 26.0–34.0)
MCHC: 32.7 g/dL (ref 30.0–36.0)
MCV: 95.9 fL (ref 80.0–100.0)
Platelets: 190 10*3/uL (ref 150–400)
RBC: 2.93 MIL/uL — ABNORMAL LOW (ref 4.22–5.81)
RDW: 14.6 % (ref 11.5–15.5)
WBC: 7.7 10*3/uL (ref 4.0–10.5)
nRBC: 0 % (ref 0.0–0.2)

## 2019-01-24 MED ORDER — PROCHLORPERAZINE EDISYLATE 10 MG/2ML IJ SOLN
10.0000 mg | Freq: Four times a day (QID) | INTRAMUSCULAR | Status: DC | PRN
  Administered 2019-01-24: 10 mg via INTRAVENOUS
  Filled 2019-01-24: qty 2

## 2019-01-24 MED ORDER — ONDANSETRON HCL 4 MG/2ML IJ SOLN
4.0000 mg | Freq: Once | INTRAMUSCULAR | Status: AC
  Administered 2019-01-24: 4 mg via INTRAVENOUS

## 2019-01-24 MED ORDER — SODIUM CHLORIDE 0.9% FLUSH
10.0000 mL | Freq: Two times a day (BID) | INTRAVENOUS | Status: DC
  Administered 2019-01-24 – 2019-01-25 (×2): 10 mL

## 2019-01-24 MED ORDER — PANTOPRAZOLE SODIUM 40 MG IV SOLR
40.0000 mg | INTRAVENOUS | Status: DC
  Administered 2019-01-25: 40 mg via INTRAVENOUS
  Filled 2019-01-24: qty 40

## 2019-01-24 MED ORDER — ALBUTEROL SULFATE (2.5 MG/3ML) 0.083% IN NEBU: 2.5000 mg | INHALATION_SOLUTION | RESPIRATORY_TRACT | Status: DC | PRN

## 2019-01-24 MED ORDER — SODIUM CHLORIDE 0.9% FLUSH: 10.0000 mL | INTRAVENOUS | Status: DC | PRN

## 2019-01-24 MED ORDER — IMMUNE GLOBULIN (HUMAN) 10 GM/100ML IV SOLN
400.0000 mg/kg | INTRAVENOUS | Status: DC
  Administered 2019-01-24: 30 g via INTRAVENOUS
  Filled 2019-01-24 (×2): qty 300

## 2019-01-24 MED ORDER — METOCLOPRAMIDE HCL 5 MG/ML IJ SOLN
5.0000 mg | Freq: Four times a day (QID) | INTRAMUSCULAR | Status: DC
  Filled 2019-01-24 (×2): qty 2

## 2019-01-24 NOTE — Progress Notes (Signed)
RN spoke to [facility] MD about pt's NG tube placement. Due to pt having esophageal paralysis along with intermittent vomiting - NG will be placed with IR. RN spoke with diagnostic radiology. Order placed for procedure. They stated earliest they can place NG tube will be tomorrow AM. RN update Kamineni MD.

## 2019-01-24 NOTE — Progress Notes (Signed)
RT Note;  VC 63ml NIF -26  Patient displayed good effort.

## 2019-01-24 NOTE — Consult Note (Signed)
Requesting Physician: Dr. Hilliard Clark    Chief Complaint: Progressive dysphagia  History obtained from: Patient and Chart    HPI:                                                                                                                                       [name] is a 78 y.o. male with past medical history significant for prior CVA, PE, ocular myasthenia gravis, glaucoma, hypertension, diabetes mellitus, coronary artery disease, CKD who presented to the emergency department with vomiting fever and progressive dysphagia for several weeks.  Dysphagia apparently started 2 months after her neck surgery.  Evaluated by GI, was noted to have esophageal stenosis status post dilation.  Continues to have esophageal dysmotility and neurology consulted.  Regarding history of ocular myasthenia gravis, previously on low-dose steroids and pyridostigmine.  Has not had any ocular symptoms first 6 months.   Past Medical History:  Diagnosis Date  . Bruises easily    d/t being on Eliquis  . Carotid artery disease ([place])    a. Duplex 05/2014: 123456 RICA, 123456 LICA, elevated velocities in right subclavian artery, normal left subclavian artery..  . Chronic back pain    HNP  . CKD (chronic kidney disease)   . Claustrophobia    takes Ativan as needed  . 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 Coronary atherosclerosis of unspecified type of vessel, native or graft    a. cath in 2003 showed 10-20% LM, scattered 20% prox-mid LAD, 30% more distal LAD, 50-60% stenosis of LAD towards apex, 20% Cx, 30% dRCA, focal 95% stenosis in smaller of 2 branches of PDA, EF 60-65%.  . Diabetes mellitus ([place])    no longer- cancxelled by Dr Hale Bogus  . Diarrhea    takes Imodium daily as needed  . Diverticulosis of colon (without mention of hemorrhage)   . DJD (degenerative joint disease)   . DVT (deep venous thrombosis) ([place]) 07/2011  . Esophageal stricture   . Essential hypertension     takes Imdur and Lisinopril daily  . Gastric polyps    benign  . GERD (gastroesophageal reflux disease)    takes Nexium daily  . Glaucoma    uses eye drops  . History of blood transfusion `   leg amputation  . History of bronchitis    not sure when the last time  . History of colon polyps    benign  . History of kidney stones    litrotrispspy  . Insomnia    takes Melatonin nightly as needed  . Mixed hyperlipidemia    takes Crestor daily  . Myasthenia gravis ([place])    uses prednisone  . Osteoporosis 2016  . Peripheral edema    takes Lasix daily  . Peripheral vascular disease ([place])   . Pituitary tumor    checks Dr[facility]Walden at [facility] checks it every Jan   .  PONV (postoperative nausea and vomiting)   . Ptosis   . Pulmonary embolus ([place]) 2012  . Stroke ([place]) 06/2015   x 3- last one 2018- balance issuses    Past Surgical History:  Procedure Laterality Date  . ABDOMINAL AORTOGRAM W/LOWER EXTREMITY N/A 11/18/2016   Procedure: ABDOMINAL AORTOGRAM W/LOWER EXTREMITY;  Surgeon: [name], MD;  Location: [facility];  Service: Cardiovascular;  Laterality: N/A;  . AMPUTATION Right 02/07/2018   Procedure: right leg AMPUTATION ABOVE KNEE;  Surgeon: [name], MD;  Location: [place];  Service: Vascular;  Laterality: Right;  . ANTERIOR CERVICAL DECOMP/DISCECTOMY FUSION N/A 11/09/2018   Procedure: CERVICAL THREE-FOUR ANTERIOR CERVICAL DECOMPRESSION/DISCECTOMY FUSION WITH CERVICAL FIVE CORPECTOMY;  Surgeon: [name], MD;  Location: [facility];  Service: Neurosurgery;  Laterality: N/A;  . AORTOGRAM Right 12/23/2017   Procedure: AORTOGRAM, RIGHT LOWER EXTREMITY ANGIOGRAM, RIGHT SUPERFICIAL FEMORAL ARTERY ANGIOPLASTY WITH STENT, RIGHT POPLITEAL ARTERY ANGIOPLASTY WITH STENT, LEFT GROIN CUTDOWN AND LEFT FEMORAL ARTERY REPAIR;  Surgeon: [name], MD;  Location: [place] [facility];  Service: Vascular;  Laterality: Right;  . BACK SURGERY     x2, lumbar and cervical  . BIOPSY   02/09/2019   Procedure: BIOPSY;  Surgeon: Thornton Park, MD;  Location: [facility];  Service: Gastroenterology;;  . CARDIAC CATHETERIZATION    . CARPAL TUNNEL RELEASE Left 09/23/2018   Procedure: CARPAL TUNNEL RELEASE;  Surgeon: [name], MD;  Location: [facility];  Service: Neurosurgery;  Laterality: Left;  CARPAL TUNNEL RELEASE  . CATARACT EXTRACTION Bilateral 12/12  . COLONOSCOPY    . ESOPHAGEAL DILATION  01/27/2019   Procedure: ESOPHAGEAL DILATION;  Surgeon: Thornton Park, MD;  Location: [facility];  Service: Gastroenterology;;  . ESOPHAGOGASTRODUODENOSCOPY (EGD) WITH PROPOFOL N/A 01/10/2019   Procedure: ESOPHAGOGASTRODUODENOSCOPY (EGD) WITH PROPOFOL;  Surgeon: Thornton Park, MD;  Location: [facility];  Service: Gastroenterology;  Laterality: N/A;  . ESOPHAGOGASTRODUODENOSCOPY ENDOSCOPY  04/2015  . IR GENERIC HISTORICAL  03/03/2016   IR IVC FILTER PLMT / S&I [name] GUID/MOD SED 03/03/2016 [name], MD [facility]INTER[facility] RAD  . IR GENERIC HISTORICAL  04/09/2016   IR RADIOLOGIST EVAL & MGMT 04/09/2016 [name], MD [facility] INTER[facility] RAD  . IR GENERIC HISTORICAL  04/30/2016   IR IVC FILTER RETRIEVAL / S&I [name] GUID/MOD SED 04/30/2016 [name], MD [facility]-INTERV RAD  . LITHOTRIPSY    . LUMBAR LAMINECTOMY/DECOMPRESSION MICRODISCECTOMY Right 09/14/2012   Procedure: Right Lumbar three-four, four-five, Lumbar five-Sacral one decompressive laminectomy;  Surgeon: [name], MD;  Location: [facility];  Service: Neurosurgery;  Laterality: Right;  . LUMBAR LAMINECTOMY/DECOMPRESSION MICRODISCECTOMY Left 03/11/2016   Procedure: Left Lumbar Two-ThreeMicrodiscectomy;  Surgeon: [name], MD;  Location: [facility];  Service: Neurosurgery;  Laterality: Left;  . PERIPHERAL VASCULAR INTERVENTION  11/18/2016   Procedure: PERIPHERAL VASCULAR INTERVENTION;  Surgeon: [name], MD;  Location: [facility];  Service: Cardiovascular;;  Left popliteal  . SHOULDER SURGERY Left 05/07/2009  .  TONSILLECTOMY    . ULNAR NERVE TRANSPOSITION Left 09/23/2018   Procedure: ULNAR NERVE DECOMPRESSION/TRANSPOSITION;  Surgeon: [name], MD;  Location: [facility];  Service: Neurosurgery;  Laterality: Left;  ULNAR NERVE DECOMPRESSION/TRANSPOSITION    Family History  Problem Relation Age of Onset  . Heart disease Mother   . Heart attack Mother   . Stroke Mother   . Heart disease Father   . Heart attack Father   . Stroke Father   . Breast cancer Sister  Twin   . Heart attack Sister   . Dementia Sister   . Heart disease Sister   . Heart attack Sister   . Clotting disorder Sister   . Heart attack Sister   . Colon cancer Neg Hx    Social History:  reports that he has never smoked. He has never used smokeless tobacco. He reports that he does not drink alcohol or use drugs.  Allergies:  Allergies  Allergen Reactions  . Other Other (See Comments)    Regarding scans, the PATIENT IS CLAUSTROPHOBIC!!!  . Phenergan [Promethazine Hcl] Swelling and Other (See Comments)    Cannot be combined with Zofran because swelling of the eyes resulted and patient became very restless-  . Zofran [Ondansetron Hcl] Swelling and Other (See Comments)    Cannot be combined with Phenergan because swelling of the eyes resulted and patient became very restless-  . Sulfamethoxazole Rash and Itching    Medications:                                                                                                                        I reviewed home medications   ROS:                                                                                                                                     14 systems reviewed and negative except above    Examination:                                                                                                      General: Appears well-developed and well-nourished.  Psych: Affect appropriate to situation Eyes: No scleral injection HENT: No OP  obstrucion Head: Normocephalic.  Cardiovascular: Normal rate and regular rhythm.  Respiratory: Effort normal and breath sounds normal to anterior ascultation GI: Soft.  No distension. There is no tenderness.  Skin: WDI    Neurological Examination Mental Status: Alert, oriented, thought content appropriate.  Speech fluent without  evidence of aphasia.  Dysarthria.  Able to follow 3 step commands without difficulty. Cranial Nerves: II: Visual fields grossly normal,  III,IV, VI: ptosis not present, extra-ocular motions intact bilaterally, pupils equal, round, reactive to light and accommodation.  Mild nystagmus noted for prolonged vertical gaze after 15 seconds. V,VII: smile symmetric, facial light touch sensation normal bilaterally VIII: hearing normal bilaterally IX,X: uvula rises symmetrically XI: bilateral shoulder shrug XII: midline tongue extension Motor: Right : Upper extremity   5/5    Left:     Upper extremity   5/5                                Lower extremity   5/5 Fatigable weakness noted after asking the patient to exercise bilateral upper extremities for 15 seconds. Sensory: Pinprick and light touch intact throughout, bilaterally Deep Tendon Reflexes: 2+ and symmetric throughout Plantars: Right: downgoing   Left: downgoing Cerebellar: normal finger-to-nose, normal rapid alternating movements and normal heel-to-shin test Gait: normal gait and station     Lab Results: Basic Metabolic Panel: Recent Labs  Lab 01/20/2019 0800 01/20/19 0155 01/22/19 0340  NA 138 140 145  K 3.0* 3.4* 3.5  CL 100 108 113*  CO2 28 23 22   GLUCOSE 98 90 93  BUN 9 11 12   CREATININE 1.84* 1.64* 1.58*  CALCIUM 8.5* 8.0* 8.3*    CBC: Recent Labs  Lab 01/17/2019 0800 01/20/19 0155 01/20/2019 0315 01/22/19 0340 01/23/19 0444 01/24/19 0208  WBC 10.9* 9.5 8.1 6.8 5.6 7.7  NEUTROABS 7.5 6.5  --   --   --   --   HGB 10.2* 9.1* 8.4* 8.4* 8.7* 9.2*  HCT 31.0* 27.8* 25.8* 25.9* 26.0* 28.1*   MCV 95.7 96.9 98.9 98.9 95.9 95.9  PLT 234 198 194 185 195 190    Coagulation Studies: No results for input(s): LABPROT, INR in the last 72 hours.  Imaging: DG Abd 1 View  Result Date: 01/24/2019 CLINICAL DATA:  Sudden onset nausea and vomiting. EXAM: ABDOMEN - 1 VIEW COMPARISON:  01/24/2019. FINDINGS: No bowel distention. Oral contrast again noted in the colon. No free air. Vascular calcification. Degenerative changes lumbar spine and both hips. IMPRESSION: No acute abnormality. Electronically Signed   By: Marcello Moores  Register   On: 01/24/2019 12:21   DG Abd Portable 1V  Result Date: 01/24/2019 CLINICAL DATA:  Abdominal distension. EXAM: PORTABLE ABDOMEN - 1 VIEW COMPARISON:  Abdominal radiograph 01/14/2019 FINDINGS: Enteric contrast within the ascending and transverse colon from esophagram yesterday. No bowel dilatation to suggest obstruction. No evidence of free air on supine view. Densities in the left lower quadrant not seen on prior exam likely barium within colonic diverticula. Right pleural effusion and basilar opacity. IMPRESSION: 1. Enteric contrast within the ascending and transverse colon from esophagram yesterday. No bowel dilatation to suggest obstruction. 2. Right pleural effusion and basilar opacity, increased from radiographs 02/05/2019. Electronically Signed   By: Keith Rake M.D.   On: 01/24/2019 02:19   DG ESOPHAGUS W SINGLE CM (SOL OR THIN BA)  Result Date: 01/23/2019 CLINICAL DATA:  Difficulty swallowing. EXAM: ESOPHOGRAM/BARIUM SWALLOW TECHNIQUE: Single contrast examination was performed using  thin barium. FLUOROSCOPY TIME:  Fluoroscopy Time:  2 minutes and 48 seconds. Radiation Exposure Index (if provided by the fluoroscopic device): 25 mGy Number of Acquired Spot Images: COMPARISON:  None. FINDINGS: Study markedly limited by patient immobility and difficulty swallowing. Patient was placed in shallow oblique  LPO position relative to the fluoro table with 30-45 degree  head up positioning. Patient was able to take small sips of thin barium. Disruption of primary peristalsis noted on all swallows at the level of the mid esophagus. Tertiary contractions are visible in the middle and distal third of the esophagus without overt presbyesophagus. The barium did pass into the stomach after repeate "dry" swallows. No gross mass lesion or ulcer evident.  No diverticulum evident. 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 Assessment for stricture, mass lesion, mucosal ulceration is markedly limited by patient difficulty with swallowing, taking only piecemeal swallows. Electronically Signed   By: Misty Stanley M.D.   On: 01/23/2019 13:50     I have reviewed the above imaging:    ASSESSMENT AND PLAN  78 year old male patient with multiple comorbidities admitted for progressive dysphagia due to esophageal stenosis status post dilatation continues to have significant nausea, vomiting and dysphagia.  Neurology consulted to see if this was related to myasthenia gravis.  On examination, he does not have significant ptosis.  Does have dysarthria and fatigable muscle weakness.  He also is having required work of breathing.  However this exam is confounded by the fact that he had aspiration pneumonia, has been not tolerating feeds for almost a week.  Per GI notes, dysmotility appears to affect mostly the mid and distal third of esophagus and agree that usually MG affects striated muscles of upper esophagus.   However, given persistence of symptoms is not unreasonable to treat patient empirically.  I am reluctant to use steroids initially as this could potentially worsen exacerbation of myasthenia as well as patient is currently being treated for aspiration pneumonia.  Discussed option of empiric treatment with IVIG, especially if no alternative explanation  for patient's esophageal dysmotility.    Impression Progressive dysphagia, worsening fatigue History of ocular myasthenia gravis  Recommendations -Consider empiric IVIG to 5 days, would prefer over steroids as steroids can acutely worsen MS and patient also currently receiving antibiotics for suspected aspiration pneumonia, discussed with hospitalist and patient agreed to pursue empiric treatment -Recommend NG tube for feeds, would atleast wait for trial of IVIG before placement of PEG tube.  -Continue pyridostigmine - NIF q12 hours   [place] Urieta [facility] Pager Number [phone]

## 2019-01-24 NOTE — Progress Notes (Signed)
PROGRESS NOTE    [name]  [id number]  DOB: Aug 16, 1940  PCP: Libby Maw, MD Admit date:01/29/2019 Hospital course: 78 y.o.malewith medical history significant ofCVA; PE; pituitary tumor; PVDs/p R AKA; myasthenia gravis; HLD; glaucoma; HTN; DVT; ?DM; CAD; CKD; and chronic back pain presented to ED with vomiting/ fever.He reported dysphagia for weeks- Started with food dysphagia, some pills he can no longer take, now having liquid dysphagia. He vomits with small amounts of eating with thick white mucus. He got a bit less responsive during hospital stay-improved with the IVF. Dysphagia started about 2 months after neck surgery.He was scheduled for a swllow test next Wednesday and EGD 12/30.  Patient admitted with IV abx for aspiration pneumonia and GI consulted for dysphagia. 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 S/p dilatation. Cont PPI. Diet advanced as tolerated. HoldingEliquisperiprocedure. Resume post procedure48 hrs on 12/14 for h/o PE. On Physostigmine for MG.  Subjective:  Patient still having significant nausea/vomiting with dysphagia even with liquid intake.  Unable to tolerate diet or p.o. meds.  He did not tolerate Reglan last night and reports feeling "loopy" with eyes swelling.  He is willing to try promethazine.  Noted to be on 1 L O2 via nasal cannula and saturating 98%.  Wife at bedside.  Objective: Vitals:   01/23/19 2057 01/24/19 0331 01/24/19 0829 01/24/19 1633  BP: (!) 169/68 (!) 166/66 (!) 161/84 (!) 168/70  Pulse: 84 86 94 79  Resp: 17 (!) 24 16 16   Temp: 98.6 F (37 C) 98.3 F (36.8 C) 98.1 F (36.7 C) 99.1 F (37.3 C)  TempSrc: Oral Oral Oral Oral  SpO2:  98% 97% 97%  Weight:      Height:        Intake/Output Summary (Last 24 hours) at 01/24/2019 1649 Last data filed at 01/24/2019 0308 Gross per 24 hour  Intake 1076.09 ml  Output --  Net 1076.09 ml   Filed Weights   02/07/2019 0934 01/22/19 0410 01/23/19  0448  Weight: 69.5 kg 72.1 kg 73.1 kg    Physical Examination:  General exam: Appears calm and comfortable  Respiratory system: Clear to auscultation. Respiratory effort normal. Cardiovascular system: S1 & S2 heard, RRR. No JVD, murmurs, rubs, gallops or clicks. No pedal edema. Gastrointestinal system: Abdomen is nondistended, soft and nontender. Normal BS heard. Central nervous system: Alert and oriented. No new focal neurological deficits. Extremities: No contractures, edema or joint deformities.  Skin: No rashes, lesions or ulcers Psychiatry: Judgement and insight appear normal. Mood & affect appropriate.   Data Reviewed: I have personally reviewed following labs and imaging studies  CBC: Recent Labs  Lab 01/17/2019 0800 01/20/19 0155 02/06/2019 0315 01/22/19 0340 01/23/19 0444 01/24/19 0208  WBC 10.9* 9.5 8.1 6.8 5.6 7.7  NEUTROABS 7.5 6.5  --   --   --   --   HGB 10.2* 9.1* 8.4* 8.4* 8.7* 9.2*  HCT 31.0* 27.8* 25.8* 25.9* 26.0* 28.1*  MCV 95.7 96.9 98.9 98.9 95.9 95.9  PLT 234 198 194 185 195 [id number]   Basic Metabolic Panel: Recent Labs  Lab 01/18/2019 0800 01/20/19 0155 01/22/19 0340  NA 138 140 145  K 3.0* 3.4* 3.5  CL 100 108 113*  CO2 28 23 22   GLUCOSE 98 90 93  BUN 9 11 12   CREATININE 1.84* 1.64* 1.58*  CALCIUM 8.5* 8.0* 8.3*   GFR: Estimated Creatinine Clearance: 36.1 mL/min (A) (by C-G formula based on SCr of 1.58 mg/dL (H)). Liver Function Tests: Recent Labs  Lab 01/28/2019 0800  AST 45*  ALT 18  ALKPHOS 67  BILITOT 1.3*  PROT 5.2*  ALBUMIN 3.2*   Recent Labs  Lab 01/18/2019 0800  LIPASE 26   No results for input(s): AMMONIA in the last 168 hours. Coagulation Profile: Recent Labs  Lab 01/20/2019 0800  INR 1.2   Cardiac Enzymes: No results for input(s): CKTOTAL, CKMB, CKMBINDEX, TROPONINI in the last 168 hours. BNP (last 3 results) No results for input(s): PROBNP in the last 8760 hours. HbA1C: No results for input(s): HGBA1C in the last 72  hours. CBG: Recent Labs  Lab 01/24/2019 2050  GLUCAP 89   Lipid Profile: No results for input(s): CHOL, HDL, LDLCALC, TRIG, CHOLHDL, LDLDIRECT in the last 72 hours. Thyroid Function Tests: No results for input(s): TSH, T4TOTAL, FREET4, T3FREE, THYROIDAB in the last 72 hours. Anemia Panel: No results for input(s): VITAMINB12, FOLATE, FERRITIN, TIBC, IRON, RETICCTPCT in the last 72 hours. Sepsis Labs: Recent Labs  Lab 02/04/2019 0800  PROCALCITON 2.90  LATICACIDVEN 1.8    Recent Results (from the past 240 hour(s))  Blood Culture (routine x 2)     Status: None   Collection Time: 01/22/2019  7:40 AM   Specimen: BLOOD  Result Value Ref Range Status   Specimen Description BLOOD RIGHT ANTECUBITAL  Final   Special Requests   Final    BOTTLES DRAWN AEROBIC ONLY Blood Culture adequate volume   Culture   Final    NO GROWTH 5 DAYS Performed at [facility], [address], [place], [place] [address]    Report Status 01/24/2019 FINAL  Final  Respiratory Panel by RT PCR (Flu A&B, Covid) - Nasopharyngeal Swab     Status: None   Collection Time: 01/18/2019 11:54 AM   Specimen: Nasopharyngeal Swab  Result Value Ref Range Status   SARS Coronavirus 2 by RT PCR NEGATIVE NEGATIVE Final    Comment: (NOTE) SARS-CoV-2 target nucleic acids are NOT DETECTED. The SARS-CoV-2 RNA is generally detectable in upper respiratoy specimens during the acute phase of infection. The lowest concentration of SARS-CoV-2 viral copies this assay can detect is 131 copies/mL. A negative result does not preclude SARS-Cov-2 infection and should not be used as the sole basis for treatment or other patient management decisions. A negative result may occur with  improper specimen collection/handling, submission of specimen other than nasopharyngeal swab, presence of viral mutation(s) within the areas targeted by this assay, and inadequate number of viral copies (<131 copies/mL). A negative result must be combined with  clinical observations, patient history, and epidemiological information. The expected result is Negative. Fact Sheet for Patients:  PinkCheek.be Fact Sheet for Healthcare Providers:  GravelBags.it This test is not yet ap proved or cleared by the [place] FDA and  has been authorized for detection and/or diagnosis of SARS-CoV-2 by FDA under an Emergency Use Authorization (EUA). This EUA will remain  in effect (meaning this test can be used) for the duration of the COVID-19 declaration under Section 564(b)(1) of the Act, 21 U.S.C. section 360bbb-3(b)(1), unless the authorization is terminated or revoked sooner.    Influenza A by PCR NEGATIVE NEGATIVE Final   Influenza B by PCR NEGATIVE NEGATIVE Final    Comment: (NOTE) The Xpert Xpress SARS-CoV-2/FLU/RSV assay is intended as an aid in  the diagnosis of influenza from Nasopharyngeal swab specimens and  should not be used as a sole basis for treatment. Nasal washings and  aspirates are unacceptable for Xpert Xpress SARS-CoV-2/FLU/RSV  testing. Fact  Sheet for Patients: PinkCheek.be Fact Sheet for Healthcare Providers: GravelBags.it This test is not yet approved or cleared by the [place] FDA and  has been authorized for detection and/or diagnosis of SARS-CoV-2 by  FDA under an Emergency Use Authorization (EUA). This EUA will remain  in effect (meaning this test can be used) for the duration of the  Covid-19 declaration under Section 564(b)(1) of the Act, 21  U.S.C. section 360bbb-3(b)(1), unless the authorization is  terminated or revoked. Performed at [facility], [place] [address], [place], [place] [address]   Blood Culture (routine x 2)     Status: None   Collection Time: 01/18/2019  5:40 PM   Specimen: BLOOD LEFT ARM  Result Value Ref Range Status   Specimen Description BLOOD LEFT ARM  Final   Special  Requests   Final    BOTTLES DRAWN AEROBIC AND ANAEROBIC Blood Culture results may not be optimal due to an inadequate volume of blood received in culture bottles   Culture   Final    NO GROWTH 5 DAYS Performed at [facility], [place] [address] [place], [place] [address]    Report Status 01/24/2019 FINAL  Final  Surgical PCR screen     Status: None   Collection Time: 02/09/2019  8:55 AM   Specimen: Nasal Mucosa; Nasal Swab  Result Value Ref Range Status   MRSA, PCR NEGATIVE NEGATIVE Final   Staphylococcus aureus NEGATIVE NEGATIVE Final    Comment: (NOTE) The Xpert SA Assay (FDA approved for NASAL specimens in patients 28 years of age and older), is one component of a comprehensive surveillance program. It is not intended to diagnose infection nor to guide or monitor treatment. Performed at [facility], [place] [address], [place], [place] [address]       Radiology Studies: DG Abd 1 View  Result Date: 01/24/2019 CLINICAL DATA:  Sudden onset nausea and vomiting. EXAM: ABDOMEN - 1 VIEW COMPARISON:  01/24/2019. FINDINGS: No bowel distention. Oral contrast again noted in the colon. No free air. Vascular calcification. Degenerative changes lumbar spine and both hips. IMPRESSION: No acute abnormality. Electronically Signed   By: Marcello Moores  Register   On: 01/24/2019 12:21   DG Abd Portable 1V  Result Date: 01/24/2019 CLINICAL DATA:  Abdominal distension. EXAM: PORTABLE ABDOMEN - 1 VIEW COMPARISON:  Abdominal radiograph 01/20/2019 FINDINGS: Enteric contrast within the ascending and transverse colon from esophagram yesterday. No bowel dilatation to suggest obstruction. No evidence of free air on supine view. Densities in the left lower quadrant not seen on prior exam likely barium within colonic diverticula. Right pleural effusion and basilar opacity. IMPRESSION: 1. Enteric contrast within the ascending and transverse colon from esophagram yesterday. No bowel dilatation to suggest  obstruction. 2. Right pleural effusion and basilar opacity, increased from radiographs 01/14/2019. Electronically Signed   By: Keith Rake M.D.   On: 01/24/2019 02:19   DG ESOPHAGUS W SINGLE CM (SOL OR THIN BA)  Result Date: 01/23/2019 CLINICAL DATA:  Difficulty swallowing. EXAM: ESOPHOGRAM/BARIUM SWALLOW TECHNIQUE: Single contrast examination was performed using  thin barium. FLUOROSCOPY TIME:  Fluoroscopy Time:  2 minutes and 48 seconds. Radiation Exposure Index (if provided by the fluoroscopic device): 25 mGy Number of Acquired Spot Images: COMPARISON:  None. FINDINGS: Study markedly limited by patient immobility and difficulty swallowing. Patient was placed in shallow oblique LPO position relative to the fluoro table with 30-45 degree head up positioning. Patient was able to take small sips of thin  barium. Disruption of primary peristalsis noted on all swallows at the level of the mid esophagus. Tertiary contractions are visible in the middle and distal third of the esophagus without overt presbyesophagus. The barium did pass into the stomach after repeate "dry" swallows. No gross mass lesion or ulcer evident.  No diverticulum evident. 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 Assessment for stricture, mass lesion, mucosal ulceration is markedly limited by patient difficulty with swallowing, taking only piecemeal swallows. Electronically Signed   By: Misty Stanley M.D.   On: 01/23/2019 13:50        Scheduled Meds: . brimonidine  1 drop Both Eyes BID  . enoxaparin (LOVENOX) injection  1 mg/kg Subcutaneous Q12H  . gabapentin  300 mg Oral TID  . imatinib  300 mg Oral Q lunch  . isosorbide mononitrate  30 mg Oral Daily  . metoCLOPramide (REGLAN) injection  5 mg Intravenous Q6H  . mupirocin ointment  1 application Nasal BID  . [START ON 02-04-2019]  pantoprazole (PROTONIX) IV  40 mg Intravenous Q24H  . pyridostigmine  1 mg Intravenous BID  . rosuvastatin  5 mg Oral Daily  . sodium chloride flush  10-40 mL Intracatheter Q12H  . timolol  1 drop Both Eyes BID   Continuous Infusions: . sodium chloride 75 mL/hr at 01/24/19 0026  . ampicillin-sulbactam (UNASYN) IV 3 g (01/24/19 0855)  . Immune Globulin 10%      Assessment & Plan:   Aspiration pneumonia with sepsis POA.  In the setting of dysphagia and vomiting. Leukocytosis,tachypnea, hypoxia,tachycardia on presentation.COVID/respiratory panel negative GI work-up revealed esophageal stricture on EGD as well as reduced peristalsis along mid esophagus on barium swallow with underlying history of myasthenia gravis.  Cont iv antibiotics,bloodcultures: NGTD, clinically improving.  Saturating well on room air-noted that he was placed on 1 L O2 overnight-not sure if he had desaturation overnight.  Taper to off as tolerated.Will obtain NIF and discussed with neurology regarding possibility of MG.  GI and speech eval  appreciated.  Post EGD was started on clear liquid diet but not tolerating.  N.p.o. now, NG tube ordered-to be placed by IR in a.m.  Started on IV fluids yesterday.  Transitioned medications to IV as much as possible.  Dysphagia. 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 S/p dilatation. Cont PPI twice daily for 8 weeks and avoid NSAIDs per GI recommendations.  Per GI okay to resume anticoagulation from today but may need other procedures like PEG tube placement if dysmotility felt to be irreversible.  Transition most of medications to IV or subcu.  Neurology to give patient IV IgG trial and see if his condition improves although not typical of MG to have dysphagia without ocular issues.    Myasthenia gravis. Continue physostigmine-transition to IV per pharmacy consult.  Will obtain  neph and consulted neurology who evaluated patient this morning.  Per neurology, unlikely for MG to manifest as dysphagia without ocular issues.Dr. Lorraine Lax did feel patient had some weakness along shoulder girdle and planning on IgG trial.  Not sure if chest x-ray findings reflective of atelectasis/diaphragmatic weakness.  Will obtain NIF, ICS and O2 titration as needed.  H/o PE on AC. HoldingEliquisperiprocedure. Resume post procedure48 hrs on 12/14. No recent h/o VTE (d/w patient, his family. Last VTE [id number] uncertainty of further plan and ongoing dysphagia, requested pharmacy to  dose therapeutic Lovenox subcu  DM. He does not appear to be taking medication for this issue at this time-Recent A1c was 5.8 on 11/07/18.  Will monitor on sliding scale insulin  HTN. He does not appear to be taking medications for this issue at this time  CML.  Continue withImantinib   CAD. Continue Imdur, Crestor       DVT prophylaxis: resume anticoagulation Code Status: DNR Family / Patient Communication: Discussed with patient and family member at bedside. Disposition Plan: Home when medically cleared.  Discussed with neurology and GI     LOS: 5 days    Time spent: 35 minutes    Guilford Shi, MD [facility] Pager [phone]  If 7PM-7AM, please contact night-coverage [email or web address] Password [facility] 01/24/2019, 4:49 PM

## 2019-01-24 NOTE — Progress Notes (Signed)
Daily Rounding Note  01/24/2019, 9:27 AM  LOS: 5 days   SUBJECTIVE:   Chief complaint:  Dysphagia, esophageal dysmotility.     At 1:14 this AM had nausea, vomiting after receiving IV Mestinon, IV Zofran.  Emesis was yellow-colored sputum.  Reglan ordered but patient declined as he suspected he may have an allergy.  C/O dyspnea, O2 sat 79%, new crackles audible in the lungs, 2 L nasal oxygen applied.. Continues on Unasyn.    Nausea began ~ 24 hours ago.  Dysphagia and NPO persists.    OBJECTIVE:         Vital signs in last 24 hours:    Temp:  [98.1 F (36.7 C)-98.6 F (37 C)] 98.1 F (36.7 C) (12/15 0829) Pulse Rate:  [84-94] 94 (12/15 0829) Resp:  [16-24] 16 (12/15 0829) BP: (145-169)/(47-84) 161/84 (12/15 0829) SpO2:  [93 %-98 %] 97 % (12/15 0829)   Filed Weights   02/07/2019 0934 01/22/19 0410 01/23/19 0448  Weight: 69.5 kg 72.1 kg 73.1 kg   General: Pale and ill-appearing.  Exhausted Heart: RRR Chest: Clear, unlabored breathing. Abdomen: Soft.  Not tender or distended.  Bowel sounds hypoactive.  No tinkling or tympanic bowel sounds. Extremities: No CCE. Neuro/Psych: Alert.  Appropriate.  Follows commands.  Speech clear.    Intake/Output from previous day: 12/14 0701 - 12/15 0700 In: 1086.1 [P.O.:40; I.V.:846.1; IV Piggyback:200] Out: 200 [Urine:200]  Intake/Output this shift: No intake/output data recorded.  Lab Results: Recent Labs    01/22/19 0340 01/23/19 0444 01/24/19 0208  WBC 6.8 5.6 7.7  HGB 8.4* 8.7* 9.2*  HCT 25.9* 26.0* 28.1*  PLT 185 195 190   BMET Recent Labs    01/22/19 0340  NA 145  K 3.5  CL 113*  CO2 22  GLUCOSE 93  BUN 12  CREATININE 1.58*  CALCIUM 8.3*   LFT No results for input(s): PROT, ALBUMIN, AST, ALT, ALKPHOS, BILITOT, BILIDIR, IBILI in the last 72 hours. PT/INR No results for input(s): LABPROT, INR in the last 72 hours. Hepatitis Panel No results for input(s):  HEPBSAG, HCVAB, HEPAIGM, HEPBIGM in the last 72 hours.  Studies/Results: DG Abd Portable 1V  Result Date: 01/24/2019 CLINICAL DATA:  Abdominal distension. EXAM: PORTABLE ABDOMEN - 1 VIEW COMPARISON:  Abdominal radiograph 01/17/2019 FINDINGS: Enteric contrast within the ascending and transverse colon from esophagram yesterday. No bowel dilatation to suggest obstruction. No evidence of free air on supine view. Densities in the left lower quadrant not seen on prior exam likely barium within colonic diverticula. Right pleural effusion and basilar opacity. IMPRESSION: 1. Enteric contrast within the ascending and transverse colon from esophagram yesterday. No bowel dilatation to suggest obstruction. 2. Right pleural effusion and basilar opacity, increased from radiographs 01/12/2019. Electronically Signed   By: Keith Rake M.D.   On: 01/24/2019 02:19   DG ESOPHAGUS W SINGLE CM (SOL OR THIN BA)  Result Date: 01/23/2019 CLINICAL DATA:  Difficulty swallowing. EXAM: ESOPHOGRAM/BARIUM SWALLOW TECHNIQUE: Single contrast examination was performed using  thin barium. FLUOROSCOPY TIME:  Fluoroscopy Time:  2 minutes and 48 seconds. Radiation Exposure Index (if provided by the fluoroscopic device): 25 mGy Number of Acquired Spot Images: COMPARISON:  None. FINDINGS: Study markedly limited by patient immobility and difficulty swallowing. Patient was placed in shallow oblique LPO position relative to the fluoro table with 30-45 degree head up positioning. Patient was able to take small sips of thin barium. Disruption of primary peristalsis noted on all  swallows at the level of the mid esophagus. Tertiary contractions are visible in the middle and distal third of the esophagus without overt presbyesophagus. The barium did pass into the stomach after repeate "dry" swallows. No gross mass lesion or ulcer evident.  No diverticulum evident. 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 Assessment for stricture, mass lesion, mucosal ulceration is markedly limited by patient difficulty with swallowing, taking only piecemeal swallows. Electronically Signed   By: Misty Stanley M.D.   On: 01/23/2019 13:50    Scheduled Meds: . brimonidine  1 drop Both Eyes BID  . enoxaparin (LOVENOX) injection  1 mg/kg Subcutaneous Q12H  . gabapentin  300 mg Oral TID  . imatinib  300 mg Oral Q lunch  . isosorbide mononitrate  30 mg Oral Daily  . metoCLOPramide (REGLAN) injection  5 mg Intravenous Q6H  . mupirocin ointment  1 application Nasal BID  . pantoprazole (PROTONIX) IV  40 mg Intravenous Q12H  . pyridostigmine  1 mg Intravenous BID  . rosuvastatin  5 mg Oral Daily  . sucralfate  1 g Oral TID WC & HS  . timolol  1 drop Both Eyes BID   Continuous Infusions: . sodium chloride 75 mL/hr at 01/24/19 0026  . ampicillin-sulbactam (UNASYN) IV 3 g (01/24/19 0855)   PRN Meds:.acetaminophen, HYDROmorphone (DILAUDID) injection, ondansetron (ZOFRAN) IV, prochlorperazine   ASSESMENT:   *    Dysphagia. 12/12 EGD.  Balloon dilation of distal esophageal stenosis.  This has not resulted in improvement of dysphagia. 01/23/2019 barium esophagram.  Study limited by patient immobility and difficulty swallowing that showed nonspecific esophageal dysmotility, absence of primary peristalsis in the mid and distal third of esophagus with mild tertiary contractions. ?  If he has motility disorder due to myasthenia gravis but this normally affects the upper esophagus.   Currently on Carafate, PPI, ice chips, maintain 90 degrees angle for any p.o. intake. Sounds like he had another aspiration event early this morning  *    Aspiration pneumonia.  Day 4 Unasyn, multiple previous abx.    *   ESBL Klebsiella pneumonia UTI.  Organism resistant to Unasyn.  Previously treated with cefepime, metronidazole.  *    Myasthenia  gravis.  *   Chronic anemia, normocytic in patient with chronic myeloid leukemia.  *   Hx DVT, carotid artery disease, CVA.  Chronic Eliquis on hold.  Subcu Lovenox in place.    PLAN   *    Nurse informed me that patient has been seen by neurology though they have not written a note or placed any orders yet.  Neurologists plan was for administration of IVIG, place NG tube and begin tube feeds. Given this new onset nausea, I have ordered KUB to assess BGP.  *   As he needs to remain n.p.o. and will be getting any meds via tube, will stop the Carafate.  Continue Protonix IV once daily  *    Hospitalist ordered Reglan but patient is reluctant to take this.  Just got Zofran for nausea.      Azucena Freed  01/24/2019, 9:27 AM Phone [phone]

## 2019-01-24 NOTE — Consult Note (Signed)
   [facility] CM Inpatient Consult   01/24/2019  [place] [birth year] [id number]   Patient screened for extreme high risk score for unplanned readmission score and for 3 hospitalizations and 3 ED visits in the past 6 months in the Medicare NextGen ACO.  Patient is eligible for [employer] Management programs for post hospital follow up needs. No current disposition is note at this time.  Plan:  Follow up with inpatient Transition Of Care[TOC] team.  Continue to follow progress and disposition to assess for post hospital care management needs.    Please place a [facility] Care Management consult as appropriate and for questions contact:   Natividad Brood, RN BSN [facility] Liaison  [phone] business mobile phone Toll free office [phone]  Fax number: [phone] [email or web address]

## 2019-01-24 NOTE — Progress Notes (Signed)
After receiving zofran and mestinon, patient became nauseous and began throwing up a yellow sputurm. Beaulah Corin was notified, and she stopped by. Reglan was ordered but not given. Pt declined because he fears he may be allergic to it. Crackles are now heard in the patient's lungs. The patient complained of SOB and O2 was 79%, so 2 L of O2 was applied. He is now sating at 96%. Beaulah Corin was notified again, will continue to monitor.

## 2019-01-25 ENCOUNTER — Encounter (HOSPITAL_COMMUNITY): Payer: Self-pay

## 2019-01-25 ENCOUNTER — Ambulatory Visit (HOSPITAL_COMMUNITY): Payer: Medicare Other

## 2019-01-25 ENCOUNTER — Inpatient Hospital Stay (HOSPITAL_COMMUNITY): Payer: Medicare Other

## 2019-01-25 ENCOUNTER — Telehealth: Payer: Self-pay | Admitting: Nurse Practitioner

## 2019-01-25 ENCOUNTER — Encounter (HOSPITAL_COMMUNITY): Payer: Medicare Other

## 2019-01-25 DIAGNOSIS — N179 Acute kidney failure, unspecified: Secondary | ICD-10-CM

## 2019-01-25 DIAGNOSIS — Z7901 Long term (current) use of anticoagulants: Secondary | ICD-10-CM

## 2019-01-25 DIAGNOSIS — N183 Chronic kidney disease, stage 3 unspecified: Secondary | ICD-10-CM

## 2019-01-25 DIAGNOSIS — I10 Essential (primary) hypertension: Secondary | ICD-10-CM

## 2019-01-25 DIAGNOSIS — C9211 Chronic myeloid leukemia, BCR/ABL-positive, in remission: Secondary | ICD-10-CM

## 2019-01-25 DIAGNOSIS — K2970 Gastritis, unspecified, without bleeding: Secondary | ICD-10-CM

## 2019-01-25 DIAGNOSIS — K2990 Gastroduodenitis, unspecified, without bleeding: Secondary | ICD-10-CM

## 2019-01-25 DIAGNOSIS — Z86711 Personal history of pulmonary embolism: Secondary | ICD-10-CM

## 2019-01-25 LAB — BASIC METABOLIC PANEL
Anion gap: 14 (ref 5–15)
BUN: 16 mg/dL (ref 8–23)
CO2: 18 mmol/L — ABNORMAL LOW (ref 22–32)
Calcium: 8.4 mg/dL — ABNORMAL LOW (ref 8.9–10.3)
Chloride: 113 mmol/L — ABNORMAL HIGH (ref 98–111)
Creatinine, Ser: 2.15 mg/dL — ABNORMAL HIGH (ref 0.61–1.24)
GFR calc Af Amer: 33 mL/min — ABNORMAL LOW (ref >60)
GFR calc non Af Amer: 28 mL/min — ABNORMAL LOW (ref >60)
Glucose, Bld: 95 mg/dL (ref 70–99)
Potassium: 3.8 mmol/L (ref 3.5–5.1)
Sodium: 145 mmol/L (ref 135–145)

## 2019-01-25 LAB — CBC
HCT: 30.4 % — ABNORMAL LOW (ref 39.0–52.0)
Hemoglobin: 9.8 g/dL — ABNORMAL LOW (ref 13.0–17.0)
MCH: 31.4 pg (ref 26.0–34.0)
MCHC: 32.2 g/dL (ref 30.0–36.0)
MCV: 97.4 fL (ref 80.0–100.0)
Platelets: 191 10*3/uL (ref 150–400)
RBC: 3.12 MIL/uL — ABNORMAL LOW (ref 4.22–5.81)
RDW: 14.8 % (ref 11.5–15.5)
WBC: 11 10*3/uL — ABNORMAL HIGH (ref 4.0–10.5)
nRBC: 0 % (ref 0.0–0.2)

## 2019-01-25 MED ORDER — IOHEXOL 300 MG/ML  SOLN
10.0000 mL | Freq: Once | INTRAMUSCULAR | Status: AC | PRN
  Administered 2019-01-25: 10 mL

## 2019-02-08 ENCOUNTER — Encounter: Payer: Medicare Other | Admitting: Gastroenterology

## 2019-02-08 ENCOUNTER — Other Ambulatory Visit: Payer: Self-pay | Admitting: Nurse Practitioner

## 2019-02-10 NOTE — Progress Notes (Signed)
  Speech Language Pathology    Patient Details Name: [name] MRN: [id number] DOB: [birth year] Today's Date: 2019-02-06 Time:  -      MBS scheduled for this afternoon at 1300.      Houston Siren 02/06/2019, 9:09 AM   Orbie Pyo Colvin Caroli.Ed Risk analyst [phone] Office [phone]

## 2019-02-10 NOTE — Telephone Encounter (Signed)
error 

## 2019-02-10 NOTE — Progress Notes (Addendum)
[name]   Subjective: No acute events overnight.  Asking for liquids again this morning.  Otherwise, no complaints.  Was seen by Neurology and on IVIG, NIF Q12H.  Planning on IR guided NGT today to initiate TFs.   Objective: Vital signs in last 24 hours: Temp:  [98.2 F (36.8 C)-99.1 F (37.3 C)] 99 F (37.2 C) (12/15 2145) Pulse Rate:  [79-100] 88 (12/15 2257) Resp:  [16-22] 16 (12/15 2017) BP: (149-183)/(61-80) 149/63 (12/15 2257) SpO2:  [93 %-99 %] 93 % (12/15 2257)   General: NAD Lungs:  CTA b/l, no w/r/r Heart:  RRR, no m/r/g Abdomen:  Soft, NT, ND, +BS  Intake/Output from previous day: 12/15 0701 - 12/16 0700 In: 10 [I.V.:10] Out: -  Intake/Output this shift: No intake/output data recorded.   Lab Results: Recent Labs    01/23/19 0444 01/24/19 0208 01-29-19 0230  WBC 5.6 7.7 11.0*  HGB 8.7* 9.2* 9.8*  PLT 195 190 191  MCV 95.9 95.9 97.4   BMET Recent Labs    01/24/19 1759 January 29, 2019 0230  NA 148* 145  K 3.5 3.8  CL 116* 113*  CO2 21* 18*  GLUCOSE 102* 95  BUN 14 16  CREATININE 1.97* 2.15*  CALCIUM 8.7* 8.4*   LFT No results for input(s): PROT, ALBUMIN, AST, ALT, ALKPHOS, BILITOT, BILIDIR, IBILI in the last 72 hours. PT/INR No results for input(s): INR in the last 72 hours.    Imaging/Other results: DG Abd 1 View  Result Date: 01/24/2019 CLINICAL DATA:  Sudden onset nausea and vomiting. EXAM: ABDOMEN - 1 VIEW COMPARISON:  01/24/2019. FINDINGS: No bowel distention. Oral contrast again noted in the colon. No free air. Vascular calcification. Degenerative changes lumbar spine and both hips. IMPRESSION: No acute abnormality. Electronically Signed   By: Marcello Moores  Register   On: 01/24/2019 12:21   DG Abd Portable 1V  Result Date: 01/24/2019 CLINICAL DATA:  Abdominal distension. EXAM: PORTABLE ABDOMEN - 1 VIEW COMPARISON:  Abdominal radiograph 01/10/2019 FINDINGS: Enteric contrast within the ascending and transverse colon  from esophagram yesterday. No bowel dilatation to suggest obstruction. No evidence of free air on supine view. Densities in the left lower quadrant not seen on prior exam likely barium within colonic diverticula. Right pleural effusion and basilar opacity. IMPRESSION: 1. Enteric contrast within the ascending and transverse colon from esophagram yesterday. No bowel dilatation to suggest obstruction. 2. Right pleural effusion and basilar opacity, increased from radiographs 02/05/2019. Electronically Signed   By: Keith Rake M.D.   On: 01/24/2019 02:19   DG ESOPHAGUS W SINGLE CM (SOL OR THIN BA)  Result Date: 01/23/2019 CLINICAL DATA:  Difficulty swallowing. EXAM: ESOPHOGRAM/BARIUM SWALLOW TECHNIQUE: Single contrast examination was performed using  thin barium. FLUOROSCOPY TIME:  Fluoroscopy Time:  2 minutes and 48 seconds. Radiation Exposure Index (if provided by the fluoroscopic device): 25 mGy Number of Acquired Spot Images: COMPARISON:  None. FINDINGS: Study markedly limited by patient immobility and difficulty swallowing. Patient was placed in shallow oblique LPO position relative to the fluoro table with 30-45 degree head up positioning. Patient was able to take small sips of thin barium. Disruption of primary peristalsis noted on all swallows at the level of the mid esophagus. Tertiary contractions are visible in the middle and distal third of the esophagus without overt presbyesophagus. The barium did pass into the stomach after repeate "dry" swallows. No gross mass lesion or ulcer evident.  No diverticulum evident. 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 Assessment for stricture, mass lesion, mucosal ulceration is markedly limited by patient difficulty with swallowing, taking only piecemeal swallows. Electronically Signed   By: Misty Stanley M.D.   On: 01/23/2019  13:50      Assessment and Plan:  1) Dysphagia 2) Esophageal stenosis 3) Myasthenia gravis -EGD on 02/03/2019 with 18 mm TTS balloon dilation of distal esophageal stenosis with appropriate mucosal rent.  Unfortunately, he has not had any clinical improvement in dysphagia despite appropriate esophageal dilation.  Clinical symptoms seem more consistent with upper esophageal (or oral pharyngeal dysphagia) pathology, which could be related to underlying MG.  Underwent barium esophagram on 12/14 which demonstrated decreased motility in the mid and distal esophagus.  However, there was no contrast pooling or full column contrast to suggest delayed outflow, and this description is opposite of what would be expected with MG.  Nonetheless, with continued difficulty tolerating any p.o. intake. -Has been evaluated by SLP with adequate oral function.  MBS scheduled for this afternoon -Agree with nasoenteric tube placement with initiation of TFs for enteral feeding -Had previously discussed PEG.  Patient does not want to proceed with this, and I tend to agree.  No reduce risk of aspiration with PEG tube placement. -Evaluate for clinical improvement since initiation of IVIG -Has suction at bedside -OOBTC -Upright 90 degrees for any liquids, ice chips, etc. -Antiemetics as needed -Appreciate Neurology assistance and recommendations -May benefit from outpatient Esophageal Manometry pending response to therapy  4) Gastritis -Biopsies from EGD demonstrate mild non-H. pylori gastritis and benign fundic gland polyps -Resume PPI as prescribed  5) Aspiration pneumonia -Antimicrobial therapy per primary Hospitalist service -On supplemental O2 via [place]  6) History of VTE, CAD, CVA -Previously treated with Eliquis.  Per notes, planning Lovenox SQ  7) AKI on CKD -Baseline creatinine ~1.6- prior to admission.  Creatinine 2.15 today. -IVFS -Defer additional work-up and/or Nephrology consultation to primary  Hospitalist service  GI service will continue to follow intermittently    Lavena Bullion, DO  01-29-19, 10:01 AM [facility] Pager [phone]

## 2019-02-10 NOTE — Progress Notes (Signed)
Patient ID: [name], male   DOB: 04-Mar-1940, 79 y.o.   MRN: [id number]  PROGRESS NOTE    [name]  [id number] DOB: 1940/07/27 DOA: 01/22/2019 PCP: Libby Maw, MD   Brief Narrative:  79 year old male with history of unspecified CVA, PE, pituitary tumor, PVD s/p right AKA, cellulitis, hyperlipidemia, glaucoma, hypertension, DVT, CAD, chronic back pain, CML, CKD stage III presented with fever and vomiting along with progressively worsening dyspnea.  Patient was admitted for aspiration pneumonia with IV antibiotics.  GI was consulted for dysphagia.  Underwent EGD on 01/14/2019 and was found to have esophageal stenosis status post dilatation.  Neurology was consulted for possibility of worsening myasthenia leading to dysphagia.  Assessment & Plan:   Sepsis: Present on admission Aspiration pneumonia -Currently on Unasyn.  Today is day #7 of antibiotics.  Will stop antibiotics. -Cultures negative so far. -Hemodynamically stable.  Sepsis is resolved. -Currently on 1 L oxygen by nasal cannula.  Incentive spirometry.  Wean off as able.  Dysphagia -Question of neurological cause of dysphagia in a patient with myasthenia gravis -Status post EGD on 01/20/2019 which showed esophageal stenosis status post dilation.  Continue PPI twice a day for 8 weeks and avoid NSAIDs per GI recommendations. -Patient was still having nausea and vomiting post EGD.  Might need IR guided NG tube placement for feeding.  GI following.  Patient does not want to have PEG tube placement. -Currently also on IVIG as per neurology: Hopeful that some symptoms of dysphagia improved.  Acute kidney injury on chronic kidney disease stage III -Baseline creatinine.  Creatinine 2.15 today.  Will start normal saline at 100 cc an hour.  Repeat a.m. creatinine.  Myasthenia gravis with possible worsening -Neurology following.  Continue IVIG for 5 days as per neurology.  Continue physostigmine.  History of PE on  anticoagulation -Oral Eliquis on hold.  Continue therapeutic Lovenox for now.  Diabetes mellitus type 2 -A1c 5.8 on 10/30/2018.  Continue CBGs with SSI  Hypertension -Monitor blood pressure.  Not on any oral antihypertensives at this time.  CML  -continue imatinib  CAD  -continue Imdur, Crestor  Generalized conditioning -Overall prognosis is guarded to poor.  Palliative care consult.   DVT prophylaxis: Lovenox Code Status: DNR Family Communication: None at bedside Disposition Plan: Depends on clinical outcome  Consultants: Neurology/GI Procedures:  EGD on 01/14/2019 showed esophageal stenosis status post dilation  antimicrobials:  Anti-infectives (From admission, onward)   Start     Dose/Rate Route Frequency Ordered Stop   02/05/2019 1145  Ampicillin-Sulbactam (UNASYN) 3 g in sodium chloride 0.9 % 100 mL IVPB     3 g 200 mL/hr over 30 Minutes Intravenous Every 8 hours 02/07/2019 1144     01/26/2019 0730  vancomycin (VANCOCIN) 1,500 mg in sodium chloride 0.9 % 500 mL IVPB  Status:  Discontinued     1,500 mg 250 mL/hr over 120 Minutes Intravenous Every 48 hours 02/09/2019 0853 02/04/2019 1221   01/20/19 1100  ceFEPIme (MAXIPIME) 2 g in sodium chloride 0.9 % 100 mL IVPB  Status:  Discontinued     2 g 200 mL/hr over 30 Minutes Intravenous Every 12 hours 01/20/19 1030 01/24/2019 1144   01/20/19 0730  ceFEPIme (MAXIPIME) 2 g in sodium chloride 0.9 % 100 mL IVPB  Status:  Discontinued     2 g 200 mL/hr over 30 Minutes Intravenous Every 24 hours 02/03/2019 0853 01/10/2019 1221   01/20/19 0730  ceFEPIme (MAXIPIME) 2 g in sodium chloride 0.9 %  100 mL IVPB  Status:  Discontinued     2 g 200 mL/hr over 30 Minutes Intravenous Every 24 hours 02/06/2019 1221 01/20/19 1030   01/13/2019 0715  vancomycin (VANCOCIN) 1,500 mg in sodium chloride 0.9 % 500 mL IVPB     1,500 mg 250 mL/hr over 120 Minutes Intravenous  Once 01/15/2019 0700 02/01/2019 1225   01/18/2019 0700  ceFEPIme (MAXIPIME) 2 g in sodium chloride 0.9  % 100 mL IVPB     2 g 200 mL/hr over 30 Minutes Intravenous  Once 01/26/2019 0656 02/09/2019 0836   01/16/2019 0700  metroNIDAZOLE (FLAGYL) IVPB 500 mg     500 mg 100 mL/hr over 60 Minutes Intravenous  Once 01/24/2019 0656 01/20/2019 0938   01/24/2019 0700  vancomycin (VANCOCIN) IVPB 1000 mg/200 mL premix  Status:  Discontinued     1,000 mg 200 mL/hr over 60 Minutes Intravenous  Once 01/29/2019 0656 02/04/2019 0700       Subjective: Patient seen and examined at bedside.  He wakes up slightly, is adamant about drinking water.  Poor historian.  No overnight fever or vomiting reported.  Objective: Vitals:   01/24/19 2017 01/24/19 2145 01/24/19 2257 02-22-19 0733  BP: (!) 162/68 (!) 178/74 (!) 149/63 (!) 159/88  Pulse: 81 89 88 93  Resp: 16   16  Temp: 98.9 F (37.2 C) 99 F (37.2 C)  97.8 F (36.6 C)  TempSrc: Oral Oral  Oral  SpO2: 99% 98% 93% 94%  Weight:      Height:        Intake/Output Summary (Last 24 hours) at 02/22/2019 1315 Last data filed at 2019-02-22 0900 Gross per 24 hour  Intake 10 ml  Output --  Net 10 ml   Filed Weights   01/15/2019 0934 01/22/19 0410 01/23/19 0448  Weight: 69.5 kg 72.1 kg 73.1 kg    Examination:  General exam: Elderly male lying in bed.  Sleepy, wakes up slightly on calling his name.  Is adamant about drinking water.  Poor historian. Respiratory system: Bilateral decreased breath sounds at bases with scattered crackles Cardiovascular system: S1 & S2 heard, Rate controlled Gastrointestinal system: Abdomen is nondistended, soft and nontender. Normal bowel sounds heard. Extremities: No cyanosis, clubbing, edema  Central nervous system: Poor historian. No focal neurological deficits. Moving extremities Skin: No rashes, lesions or ulcers Psychiatry: Could not be assessed because of patient being a poor historian    Data Reviewed: I have personally reviewed following labs and imaging studies  CBC: Recent Labs  Lab 01/31/2019 0800 01/20/19 0155  01/29/2019 0315 01/22/19 0340 01/23/19 0444 01/24/19 0208 02/22/2019 0230  WBC 10.9* 9.5 8.1 6.8 5.6 7.7 11.0*  NEUTROABS 7.5 6.5  --   --   --   --   --   HGB 10.2* 9.1* 8.4* 8.4* 8.7* 9.2* 9.8*  HCT 31.0* 27.8* 25.8* 25.9* 26.0* 28.1* 30.4*  MCV 95.7 96.9 98.9 98.9 95.9 95.9 97.4  PLT 234 198 194 185 195 190 [id number]   Basic Metabolic Panel: Recent Labs  Lab 02/03/2019 0800 01/20/19 0155 01/22/19 0340 01/24/19 1759 02/22/19 0230  NA 138 140 145 148* 145  K 3.0* 3.4* 3.5 3.5 3.8  CL 100 108 113* 116* 113*  CO2 28 23 22  21* 18*  GLUCOSE 98 90 93 102* 95  BUN 9 11 12 14 16   CREATININE 1.84* 1.64* 1.58* 1.97* 2.15*  CALCIUM 8.5* 8.0* 8.3* 8.7* 8.4*   GFR: Estimated Creatinine Clearance: 26.5 mL/min (A) (by C-G formula  based on SCr of 2.15 mg/dL (H)). Liver Function Tests: Recent Labs  Lab 01/12/2019 0800  AST 45*  ALT 18  ALKPHOS 67  BILITOT 1.3*  PROT 5.2*  ALBUMIN 3.2*   Recent Labs  Lab 01/23/2019 0800  LIPASE 26   No results for input(s): AMMONIA in the last 168 hours. Coagulation Profile: Recent Labs  Lab 02/05/2019 0800  INR 1.2   Cardiac Enzymes: No results for input(s): CKTOTAL, CKMB, CKMBINDEX, TROPONINI in the last 168 hours. BNP (last 3 results) No results for input(s): PROBNP in the last 8760 hours. HbA1C: No results for input(s): HGBA1C in the last 72 hours. CBG: Recent Labs  Lab 01/17/2019 2050  GLUCAP 89   Lipid Profile: No results for input(s): CHOL, HDL, LDLCALC, TRIG, CHOLHDL, LDLDIRECT in the last 72 hours. Thyroid Function Tests: No results for input(s): TSH, T4TOTAL, FREET4, T3FREE, THYROIDAB in the last 72 hours. Anemia Panel: No results for input(s): VITAMINB12, FOLATE, FERRITIN, TIBC, IRON, RETICCTPCT in the last 72 hours. Sepsis Labs: Recent Labs  Lab 01/13/2019 0800  PROCALCITON 2.90  LATICACIDVEN 1.8    Recent Results (from the past 240 hour(s))  Blood Culture (routine x 2)     Status: None   Collection Time: 01/31/2019  7:40 AM    Specimen: BLOOD  Result Value Ref Range Status   Specimen Description BLOOD RIGHT ANTECUBITAL  Final   Special Requests   Final    BOTTLES DRAWN AEROBIC ONLY Blood Culture adequate volume   Culture   Final    NO GROWTH 5 DAYS Performed at [facility], [address], [place], [place] [address]    Report Status 01/24/2019 FINAL  Final  Respiratory Panel by RT PCR (Flu A&B, Covid) - Nasopharyngeal Swab     Status: None   Collection Time: 02/03/2019 11:54 AM   Specimen: Nasopharyngeal Swab  Result Value Ref Range Status   SARS Coronavirus 2 by RT PCR NEGATIVE NEGATIVE Final    Comment: (NOTE) SARS-CoV-2 target nucleic acids are NOT DETECTED. The SARS-CoV-2 RNA is generally detectable in upper respiratoy specimens during the acute phase of infection. The lowest concentration of SARS-CoV-2 viral copies this assay can detect is 131 copies/mL. A negative result does not preclude SARS-Cov-2 infection and should not be used as the sole basis for treatment or other patient management decisions. A negative result may occur with  improper specimen collection/handling, submission of specimen other than nasopharyngeal swab, presence of viral mutation(s) within the areas targeted by this assay, and inadequate number of viral copies (<131 copies/mL). A negative result must be combined with clinical observations, patient history, and epidemiological information. The expected result is Negative. Fact Sheet for Patients:  PinkCheek.be Fact Sheet for Healthcare Providers:  GravelBags.it This test is not yet ap proved or cleared by the [place] FDA and  has been authorized for detection and/or diagnosis of SARS-CoV-2 by FDA under an Emergency Use Authorization (EUA). This EUA will remain  in effect (meaning this test can be used) for the duration of the COVID-19 declaration under Section 564(b)(1) of the Act, 21 U.S.C. section  360bbb-3(b)(1), unless the authorization is terminated or revoked sooner.    Influenza A by PCR NEGATIVE NEGATIVE Final   Influenza B by PCR NEGATIVE NEGATIVE Final    Comment: (NOTE) The Xpert Xpress SARS-CoV-2/FLU/RSV assay is intended as an aid in  the diagnosis of influenza from Nasopharyngeal swab specimens and  should not be used as a sole basis for treatment. Nasal  washings and  aspirates are unacceptable for Xpert Xpress SARS-CoV-2/FLU/RSV  testing. Fact Sheet for Patients: PinkCheek.be Fact Sheet for Healthcare Providers: GravelBags.it This test is not yet approved or cleared by the [place] FDA and  has been authorized for detection and/or diagnosis of SARS-CoV-2 by  FDA under an Emergency Use Authorization (EUA). This EUA will remain  in effect (meaning this test can be used) for the duration of the  Covid-19 declaration under Section 564(b)(1) of the Act, 21  U.S.C. section 360bbb-3(b)(1), unless the authorization is  terminated or revoked. Performed at [facility], [place] [address], [place], [place] [address]   Blood Culture (routine x 2)     Status: None   Collection Time: 01/15/2019  5:40 PM   Specimen: BLOOD LEFT ARM  Result Value Ref Range Status   Specimen Description BLOOD LEFT ARM  Final   Special Requests   Final    BOTTLES DRAWN AEROBIC AND ANAEROBIC Blood Culture results may not be optimal due to an inadequate volume of blood received in culture bottles   Culture   Final    NO GROWTH 5 DAYS Performed at [facility], [place] [address], [place], [place] [address]    Report Status 01/24/2019 FINAL  Final  Surgical PCR screen     Status: None   Collection Time: 01/27/2019  8:55 AM   Specimen: Nasal Mucosa; Nasal Swab  Result Value Ref Range Status   MRSA, PCR NEGATIVE NEGATIVE Final   Staphylococcus aureus NEGATIVE NEGATIVE Final    Comment: (NOTE) The Xpert SA Assay (FDA approved for  NASAL specimens in patients 14 years of age and older), is one component of a comprehensive surveillance program. It is not intended to diagnose infection nor to guide or monitor treatment. Performed at [facility], [place] [address] [place], [place] [address]          Radiology Studies: DG Abd 1 View  Result Date: 01/24/2019 CLINICAL DATA:  Sudden onset nausea and vomiting. EXAM: ABDOMEN - 1 VIEW COMPARISON:  01/24/2019. FINDINGS: No bowel distention. Oral contrast again noted in the colon. No free air. Vascular calcification. Degenerative changes lumbar spine and both hips. IMPRESSION: No acute abnormality. Electronically Signed   By: Marcello Moores  Register   On: 01/24/2019 12:21   DG Abd Portable 1V  Result Date: 01/24/2019 CLINICAL DATA:  Abdominal distension. EXAM: PORTABLE ABDOMEN - 1 VIEW COMPARISON:  Abdominal radiograph 01/21/2019 FINDINGS: Enteric contrast within the ascending and transverse colon from esophagram yesterday. No bowel dilatation to suggest obstruction. No evidence of free air on supine view. Densities in the left lower quadrant not seen on prior exam likely barium within colonic diverticula. Right pleural effusion and basilar opacity. IMPRESSION: 1. Enteric contrast within the ascending and transverse colon from esophagram yesterday. No bowel dilatation to suggest obstruction. 2. Right pleural effusion and basilar opacity, increased from radiographs 01/22/2019. Electronically Signed   By: Keith Rake M.D.   On: 01/24/2019 02:19   DG ESOPHAGUS W SINGLE CM (SOL OR THIN BA)  Result Date: 01/23/2019 CLINICAL DATA:  Difficulty swallowing. EXAM: ESOPHOGRAM/BARIUM SWALLOW TECHNIQUE: Single contrast examination was performed using  thin barium. FLUOROSCOPY TIME:  Fluoroscopy Time:  2 minutes and 48 seconds. Radiation Exposure Index (if provided by the fluoroscopic device): 25 mGy Number of Acquired Spot Images: COMPARISON:  None. FINDINGS: Study markedly limited by  patient immobility and difficulty swallowing. Patient was placed in shallow oblique LPO position relative to the fluoro  table with 30-45 degree head up positioning. Patient was able to take small sips of thin barium. Disruption of primary peristalsis noted on all swallows at the level of the mid esophagus. Tertiary contractions are visible in the middle and distal third of the esophagus without overt presbyesophagus. The barium did pass into the stomach after repeate "dry" swallows. No gross mass lesion or ulcer evident.  No diverticulum evident. 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 Assessment for stricture, mass lesion, mucosal ulceration is markedly limited by patient difficulty with swallowing, taking only piecemeal swallows. Electronically Signed   By: Misty Stanley M.D.   On: 01/23/2019 13:50        Scheduled Meds: . brimonidine  1 drop Both Eyes BID  . enoxaparin (LOVENOX) injection  1 mg/kg Subcutaneous Q12H  . gabapentin  300 mg Oral TID  . imatinib  300 mg Oral Q lunch  . isosorbide mononitrate  30 mg Oral Daily  . mupirocin ointment  1 application Nasal BID  . pantoprazole (PROTONIX) IV  40 mg Intravenous Q24H  . pyridostigmine  1 mg Intravenous BID  . rosuvastatin  5 mg Oral Daily  . sodium chloride flush  10-40 mL Intracatheter Q12H  . timolol  1 drop Both Eyes BID   Continuous Infusions: . sodium chloride 75 mL/hr at 01/24/19 2253  . ampicillin-sulbactam (UNASYN) IV 3 g (02/12/2019 1107)  . Immune Globulin 10% Stopped (01/24/19 2144)          Aline August, MD [facility] 02-12-2019, 1:15 PM

## 2019-02-10 NOTE — Progress Notes (Signed)
Responded to Code Blue in X-ray. Will continue to be available for spiritual care when needed.  [place].

## 2019-02-10 NOTE — Telephone Encounter (Signed)
Please call this patient's wife and speak with her regarding her husband Thank you

## 2019-02-10 NOTE — Progress Notes (Signed)
Modified Barium Swallow Progress Note  Patient Details  Name: [name] MRN: [id number] Date of Birth: 06/28/1940  Today's Date: 2019/02/01  Modified Barium Swallow completed.  Full report located under Chart Review in the Imaging Section.  Brief recommendations include the following:  Clinical Impression Pt presents with moderate oral, severe pharyngeal dysphagia with both sensory and motor issues evident. Presentations were limited to one bolus each of nectar and honey thick liquids via teaspoon. Study was terminated due to severity of dysphagia.   Orally, pt exhibits difficulty with bolus formation and posterior propulsion. Premature spillage over tongue base noted on nectar and honey thick liquids. Pharyngeal swallow is characterized by penetration and aspiration before the swallow on both nectar and honey thick liquids. No cough response was elicited (silent aspiration), however, pt did exhibit wet voice quality which he was unable to clear with cues to cough and clear his throat. Swallow reflex was weak and ineffective, with no passage of either texture into the esophagus. Pt was suctioned orally to facilitate removal of pharyngeal residue. Pt continued to exhibit wet voice quality several minutes after the study, and was unable to clear it.  Recommend strict NPO status due to the severity of pharyngeal as well as esophageal dysphagia. Unfortunately, no safe diet can be recommended at this time. Recommend consideration of Palliative Care consult to facilitate establishment of appropriate goals of care. ST will follow briefly for education with pt and wife. Significant improvement in pharyngo-esophageal swallow safety and function is not likely to be seen in the near future.   Swallow Evaluation Recommendations  SLP Diet Recommendations: NPO   Medication Administration: Via alternative means   Oral Care Recommendations: Oral care QID   Other Recommendations: Have oral suction  available  Enriqueta Shutter, [place], [place] Pathologist Office: [phone] Pager: [phone]  Shonna Chock 2019-02-01,2:43 PM

## 2019-02-10 NOTE — Death Summary Note (Signed)
Death Summary  [name] [id number] DOB: 03-Apr-1940 DOA: 02/07/19  PCP: Libby Maw, MD  Admit date: 2019-02-07 Date of Death: 2019-02-13 Time of Death: 68   History of present illness:  79 year old male with history of unspecified CVA, PE, pituitary tumor, PVD s/p right AKA, cellulitis, hyperlipidemia, glaucoma, hypertension, DVT, CAD, chronic back pain, CML, CKD stage III presented with fever and vomiting along with progressively worsening dyspnea.  Patient was admitted for aspiration pneumonia with IV antibiotics.  GI was consulted for dysphagia.  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  Neurology was consulted for possibility of worsening myasthenia leading to dysphagia.  Patient was started on IVIG.  He had recurrence of nausea and vomiting after EGD and patient was supposed to have IR guided NG tube placement per GI recommendations as well as MBS per SLP recommendations.  Patient had gone down to radiology today for IR guided NG tube placement but unfortunately he passed away.  He was a DNR.  Final Diagnoses:  Probable respiratory disease causing cardiorespiratory arrest Sepsis: Present on admission Aspiration pneumonia Dysphagia Acute kidney injury on chronic kidney disease stage III Myasthenia gravis with possible worsening History of PE Diabetes mellitus type 2 Hypertension CML CAD   The results of significant diagnostics from this hospitalization (including imaging, microbiology, ancillary and laboratory) are listed below for reference.    Significant Diagnostic Studies: DG Abd 1 View  Result Date: 02-13-19 CLINICAL DATA:  Feeding catheter placement EXAM: ABDOMEN - 1 VIEW COMPARISON:  None. FINDINGS: Feeding catheter was placed under fluoroscopic guidance by emboli Evans. Catheter is noted coiled within the stomach. Patient subsequently expired following catheter placement in the emergency room.  IMPRESSION: Feeding catheter within the stomach. Electronically Signed   By: Inez Catalina M.D.   On: 02-13-19 13:55   DG Abd 1 View  Result Date: 01/24/2019 CLINICAL DATA:  Sudden onset nausea and vomiting. EXAM: ABDOMEN - 1 VIEW COMPARISON:  01/24/2019. FINDINGS: No bowel distention. Oral contrast again noted in the colon. No free air. Vascular calcification. Degenerative changes lumbar spine and both hips. IMPRESSION: No acute abnormality. Electronically Signed   By: Marcello Moores  Register   On: 01/24/2019 12:21   DG Abdomen 1 View  Result Date: 02-07-19 CLINICAL DATA:  Abdominal swelling, fever EXAM: ABDOMEN - 1 VIEW COMPARISON:  06/17/2015 FINDINGS: Nonspecific bowel gas pattern without evidence of obstruction. No gross free intraperitoneal air. No radio-opaque calculi or other significant radiographic abnormality are seen. IMPRESSION: Nonobstructive bowel gas pattern. Electronically Signed   By: Davina Poke M.D.   On: 02-07-19 09:48   CT SOFT TISSUE NECK WO CONTRAST  Result Date: 01/14/2019 CLINICAL DATA:  79 year old male with difficulty swallowing for 2 weeks. Prior cervical spine surgery. 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 EXAM: CT NECK WITHOUT CONTRAST TECHNIQUE: Multidetector CT imaging of the neck was performed following the standard protocol without intravenous contrast. COMPARISON:  CTA head and neck 07/19/2017. FINDINGS: Pharynx and larynx: Negative noncontrast larynx and pharynx. Parapharyngeal and retropharyngeal spaces appear normal. Salivary glands: Negative sublingual space. Punctate left submandibular gland sialolithiasis (series 3, image 51) seems new since 2019. Otherwise negative noncontrast submandibular glands, no evidence of inflammation or obstruction. Negative noncontrast parotid glands. Thyroid: Negative. Lymph nodes: Negative, no lymphadenopathy. Interval posttraumatic or postoperative changes to the anterior right lower neck on series 3, image 66 with mild  regional stranding and soft tissue architectural distortion. This is near the carotid space, but no other  postoperative changes to that space are evident, and this is probably related to interval cervical spine surgery. Vascular: Chronic extensive carotid atherosclerosis, most pronounced at the right carotid bifurcation and both ICA siphons. Similar distal vertebral artery calcified plaque. Vascular patency is not evaluated in the absence of IV contrast. Limited intracranial: Small chronic infarct in the left cerebellum PICA territory appears stable. Visualized orbits: Stable, negative. Mastoids and visualized paranasal sinuses: Chronic right maxillary sinusitis appears stable with some and spur 8 at secretions. Other visualized paranasal sinuses and mastoids are clear. Skeleton: Interval postoperative changes to the cervical spine C3 through C6. ACDF plus C5 corpectomy. No evidence of hardware loosening. Evidence of developing solid arthrodesis at those levels. Residual facet arthropathy. Evidence of developing ankylosis also at C6-C7. Stable degenerative appearing anterolisthesis of C7 on T1 also with suggestion of developing posterior element ankylosis. No acute osseous abnormality identified. Upper chest: Calcified aortic atherosclerosis. Otherwise negative. IMPRESSION: 1. No acute findings in the neck. No explanation for acute difficulty swallowing. 2. Interval postoperative changes to the cervical spine with no adverse features. 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 3. Punctate left submandibular gland sialolithiasis. 4. Chronic right maxillary sinusitis. 5. Carotid, vertebral, and Aortic atherosclerosis (ICD10-I70.0). Electronically Signed   By: Genevie Ann M.D.   On: 01/14/2019 14:23   DG Chest Port 1 View  Result Date: 01/24/2019 CLINICAL DATA:  Assess for obstruction. Fever and weakness with nausea and vomiting 2 weeks. EXAM:  PORTABLE CHEST 1 VIEW COMPARISON:  10/30/2018 FINDINGS: Lungs are adequately inflated with airspace consolidation over the right base which may be due to pneumonia or atelectasis. No evidence of effusion. Borderline stable cardiomegaly. Remainder of the exam is unchanged. IMPRESSION: Opacification over the right base which may be due to atelectasis or pneumonia. Electronically Signed   By: Marin Olp M.D.   On: 01/27/2019 09:44   DG Abd Portable 1V  Result Date: 01/24/2019 CLINICAL DATA:  Abdominal distension. EXAM: PORTABLE ABDOMEN - 1 VIEW COMPARISON:  Abdominal radiograph 01/27/2019 FINDINGS: Enteric contrast within the ascending and transverse colon from esophagram yesterday. No bowel dilatation to suggest obstruction. No evidence of free air on supine view. Densities in the left lower quadrant not seen on prior exam likely barium within colonic diverticula. Right pleural effusion and basilar opacity. IMPRESSION: 1. Enteric contrast within the ascending and transverse colon from esophagram yesterday. No bowel dilatation to suggest obstruction. 2. Right pleural effusion and basilar opacity, increased from radiographs 01/21/2019. Electronically Signed   By: Keith Rake M.D.   On: 01/24/2019 02:19   DG ESOPHAGUS W SINGLE CM (SOL OR THIN BA)  Result Date: 01/23/2019 CLINICAL DATA:  Difficulty swallowing. EXAM: ESOPHOGRAM/BARIUM SWALLOW TECHNIQUE: Single contrast examination was performed using  thin barium. FLUOROSCOPY TIME:  Fluoroscopy Time:  2 minutes and 48 seconds. Radiation Exposure Index (if provided by the fluoroscopic device): 25 mGy Number of Acquired Spot Images: COMPARISON:  None. FINDINGS: Study markedly limited by patient immobility and difficulty swallowing. Patient was placed in shallow oblique LPO position relative to the fluoro table with 30-45 degree head up positioning. Patient was able to take small sips of thin barium. Disruption of primary peristalsis noted on all swallows at  the level of the mid esophagus. Tertiary contractions are visible in the middle and distal third of the esophagus without overt presbyesophagus. The barium did pass into the stomach after repeate "dry" swallows. No gross mass lesion or  ulcer evident.  No diverticulum evident. 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 Assessment for stricture, mass lesion, mucosal ulceration is markedly limited by patient difficulty with swallowing, taking only piecemeal swallows. Electronically Signed   By: Misty Stanley M.D.   On: 01/23/2019 13:50    Microbiology: Recent Results (from the past 240 hour(s))  Blood Culture (routine x 2)     Status: None   Collection Time: 01/28/2019  7:40 AM   Specimen: BLOOD  Result Value Ref Range Status   Specimen Description BLOOD RIGHT ANTECUBITAL  Final   Special Requests   Final    BOTTLES DRAWN AEROBIC ONLY Blood Culture adequate volume   Culture   Final    NO GROWTH 5 DAYS Performed at [facility], [address], [place], [place] [address]    Report Status 01/24/2019 FINAL  Final  Respiratory Panel by RT PCR (Flu A&B, Covid) - Nasopharyngeal Swab     Status: None   Collection Time: 02/01/2019 11:54 AM   Specimen: Nasopharyngeal Swab  Result Value Ref Range Status   SARS Coronavirus 2 by RT PCR NEGATIVE NEGATIVE Final    Comment: (NOTE) SARS-CoV-2 target nucleic acids are NOT DETECTED. The SARS-CoV-2 RNA is generally detectable in upper respiratoy specimens during the acute phase of infection. The lowest concentration of SARS-CoV-2 viral copies this assay can detect is 131 copies/mL. A negative result does not preclude SARS-Cov-2 infection and should not be used as the sole basis for treatment or other patient management decisions. A negative result may occur with  improper specimen collection/handling, submission of specimen  other than nasopharyngeal swab, presence of viral mutation(s) within the areas targeted by this assay, and inadequate number of viral copies (<131 copies/mL). A negative result must be combined with clinical observations, patient history, and epidemiological information. The expected result is Negative. Fact Sheet for Patients:  PinkCheek.be Fact Sheet for Healthcare Providers:  GravelBags.it This test is not yet ap proved or cleared by the [place] FDA and  has been authorized for detection and/or diagnosis of SARS-CoV-2 by FDA under an Emergency Use Authorization (EUA). This EUA will remain  in effect (meaning this test can be used) for the duration of the COVID-19 declaration under Section 564(b)(1) of the Act, 21 U.S.C. section 360bbb-3(b)(1), unless the authorization is terminated or revoked sooner.    Influenza A by PCR NEGATIVE NEGATIVE Final   Influenza B by PCR NEGATIVE NEGATIVE Final    Comment: (NOTE) The Xpert Xpress SARS-CoV-2/FLU/RSV assay is intended as an aid in  the diagnosis of influenza from Nasopharyngeal swab specimens and  should not be used as a sole basis for treatment. Nasal washings and  aspirates are unacceptable for Xpert Xpress SARS-CoV-2/FLU/RSV  testing. Fact Sheet for Patients: PinkCheek.be Fact Sheet for Healthcare Providers: GravelBags.it This test is not yet approved or cleared by the [place] FDA and  has been authorized for detection and/or diagnosis of SARS-CoV-2 by  FDA under an Emergency Use Authorization (EUA). This EUA will remain  in effect (meaning this test can be used) for the duration of the  Covid-19 declaration under Section 564(b)(1) of the Act, 21  U.S.C. section 360bbb-3(b)(1), unless the authorization is  terminated or revoked. Performed at [facility], [place] [address], [place], [place] [address]     Blood Culture (routine x 2)     Status: None  Collection Time: 02/05/2019  5:40 PM   Specimen: BLOOD LEFT ARM  Result Value Ref Range Status   Specimen Description BLOOD LEFT ARM  Final   Special Requests   Final    BOTTLES DRAWN AEROBIC AND ANAEROBIC Blood Culture results may not be optimal due to an inadequate volume of blood received in culture bottles   Culture   Final    NO GROWTH 5 DAYS Performed at [facility], [place] [address], [place], [place] [address]    Report Status 01/24/2019 FINAL  Final  Surgical PCR screen     Status: None   Collection Time: 02/02/2019  8:55 AM   Specimen: Nasal Mucosa; Nasal Swab  Result Value Ref Range Status   MRSA, PCR NEGATIVE NEGATIVE Final   Staphylococcus aureus NEGATIVE NEGATIVE Final    Comment: (NOTE) The Xpert SA Assay (FDA approved for NASAL specimens in patients 88 years of age and older), is one component of a comprehensive surveillance program. It is not intended to diagnose infection nor to guide or monitor treatment. Performed at [facility], [place] [address], [place], [place] [address]      Labs: Basic Metabolic Panel: Recent Labs  Lab 02/01/2019 0800 01/20/19 0155 01/22/19 0340 01/24/19 1759 02/22/19 0230  NA 138 140 145 148* 145  K 3.0* 3.4* 3.5 3.5 3.8  CL 100 108 113* 116* 113*  CO2 28 23 22  21* 18*  GLUCOSE 98 90 93 102* 95  BUN 9 11 12 14 16   CREATININE 1.84* 1.64* 1.58* 1.97* 2.15*  CALCIUM 8.5* 8.0* 8.3* 8.7* 8.4*   Liver Function Tests: Recent Labs  Lab 01/10/2019 0800  AST 45*  ALT 18  ALKPHOS 67  BILITOT 1.3*  PROT 5.2*  ALBUMIN 3.2*   Recent Labs  Lab 02/04/2019 0800  LIPASE 26   No results for input(s): AMMONIA in the last 168 hours. CBC: Recent Labs  Lab 01/12/2019 0800 01/20/19 0155 02/06/2019 0315 01/22/19 0340 01/23/19 0444 01/24/19 0208 February 22, 2019 0230  WBC 10.9* 9.5 8.1 6.8 5.6 7.7 11.0*  NEUTROABS 7.5 6.5  --   --   --   --   --   HGB 10.2* 9.1* 8.4* 8.4* 8.7* 9.2* 9.8*   HCT 31.0* 27.8* 25.8* 25.9* 26.0* 28.1* 30.4*  MCV 95.7 96.9 98.9 98.9 95.9 95.9 97.4  PLT 234 198 194 185 195 190 191   Cardiac Enzymes: No results for input(s): CKTOTAL, CKMB, CKMBINDEX, TROPONINI in the last 168 hours. D-Dimer No results for input(s): DDIMER in the last 72 hours. BNP: Invalid input(s): POCBNP CBG: Recent Labs  Lab 01/26/2019 2050  GLUCAP 89   Anemia work up No results for input(s): VITAMINB12, FOLATE, FERRITIN, TIBC, IRON, RETICCTPCT in the last 72 hours. Urinalysis    Component Value Date/Time   COLORURINE YELLOW 12/16/2018 1519   APPEARANCEUR CLEAR 12/16/2018 1519   LABSPEC 1.006 12/16/2018 1519   PHURINE 7.0 12/16/2018 1519   GLUCOSEU NEGATIVE 12/16/2018 1519   GLUCOSEU NEGATIVE 04/09/2014 0910   HGBUR NEGATIVE 12/16/2018 1519   BILIRUBINUR NEGATIVE 12/16/2018 1519   KETONESUR NEGATIVE 12/16/2018 1519   PROTEINUR NEGATIVE 12/16/2018 1519   UROBILINOGEN 1.0 04/09/2014 0910   NITRITE NEGATIVE 12/16/2018 1519   LEUKOCYTESUR NEGATIVE 12/16/2018 1519   Sepsis Labs Invalid input(s): PROCALCITONIN,  WBC,  LACTICIDVEN     SIGNED:  Aline August, MD  [facility] 02-22-2019, 2:30 PM

## 2019-02-10 NOTE — Progress Notes (Signed)
RT note:  NIF -25 (2nd attempt) VC 700 mL (3rd attempt)  Pt had good effort

## 2019-02-10 NOTE — Progress Notes (Signed)
Notified at 1326 by central telemetry that patient appeared to be to be in asystole, confirmed on monitor, pt in radiology for swallow study and NGT placement.  Received call from radiology requesting my presence, confirmed with caller that patient is DNR. Arrived to radiology to transport expired patient back to unit, wife notified, md notified, transplant services notified- determined to not be eligible for donation.

## 2019-02-10 NOTE — Progress Notes (Signed)
PALLIATIVE NOTE:  Referral received for GOC. Patient is currently off the floor for procedure (MBS and possible NGT placement) per RN.   I will attempt to follow up with patient later today (if time permits) or tomorrow for goals of care discussion.   Detailed and notes and recommendations to follow completed discussion.   Thank you for your referral and allowing Palliative to assist in Mr. Power's care.   Alda Lea, AGPCNP-BC Palliative Medicine Team  Phone: [phone]  NO CHARGE

## 2019-02-10 NOTE — Progress Notes (Signed)
Initial Nutrition Assessment  INTERVENTION:   Once tube is placed: -Initiate Osmolite 1.5 @ 20 ml/hr, advance by 10 ml every 4 hours to goal rate of 50 ml/hr. -30 ml Prostat daily -This will provide 1900 kcals, 90g protein and 914 ml H2O  NUTRITION DIAGNOSIS:   Inadequate oral intake related to dysphagia as evidenced by per patient/family report, NPO status  GOAL:   Patient will meet greater than or equal to 90% of their needs  MONITOR:   Labs, Weight trends, TF tolerance, I & O's, Skin, GOC  REASON FOR ASSESSMENT:   Consult Assessment of nutrition requirement/status, Enteral/tube feeding initiation and management  ASSESSMENT:   79 y.o. male with medical history significant of CVA; PE; pituitary tumor; PVD s/p R AKA; myasthenia gravis; HLD; glaucoma; HTN; DVT; ?DM; CAD; CKD; and chronic back pain presented to ED with vomiting/ fever. He reported dysphagia for weeks- Started with food dysphagia, some pills he can no longer take, now having liquid dysphagia.  He vomits with small amounts of eating with thick white mucus.  He got a bit less responsive during hospital stay-improved with the IVF.  Dysphagia started about 2 months after neck surgery.He was scheduled for a swllow test next Wednesday and EGD 12/30. Patient admitted with IV abx for aspiration pneumonia and GI consulted for dysphagia. -underwent EGD (12/12): found to have esophageal stenosis. S/p dilatation.  12/10: admitted for dysphagia, concern for aspiration pneumonia 12/12: s/p EGD, esophageal dilation 12/14: s/p barium esophagram reveals esophageal motility disorder   **RD working remotely**  Pt scheduled for MBS this afternoon. Per GI note, pt to have NGT placed via IR today as well to initiate tube feedings. Pt and family declined PEG placement at this time.  No answer when RD attempted to call patient.  Will monitor for results of MBS.  Per chart review, pt has had progressive dysphagia over the past few weeks,  since a cervical spine fusion in September. He developed N/V for 2 weeks PTA. This in combination with dysphagia has made it difficult to eat.  Pt has continued to not be able to tolerate any PO since admission.  Admission weight: 153 lbs. Current weight: 161 lbs.  No weight loss was noted PTA.   Labs reviewed. Medications reviewed.  NUTRITION - FOCUSED PHYSICAL EXAM:  Working remotely.  Diet Order:   Diet Order            Diet NPO time specified  Diet effective now              EDUCATION NEEDS:   Not appropriate for education at this time  Skin:  Skin Assessment: Skin Integrity Issues: Skin Integrity Issues:: DTI DTI: left heel  Last BM:  PTA  Height:   Ht Readings from Last 1 Encounters:  01/27/2019 5\' 8"  (1.727 m)    Weight:   Wt Readings from Last 1 Encounters:  01/23/19 73.1 kg    Ideal Body Weight:  63 kg -adjusted for rt AKA  BMI:  Body mass index is 24.5 kg/m.  Estimated Nutritional Needs:   Kcal:  1700-1900  Protein:  80-90g  Fluid:  1.9L/day  Clayton Bibles, MS, RD, LDN Inpatient Clinical Dietitian Pager: [phone] After Hours Pager: [phone]

## 2019-02-10 DEATH — deceased

## 2019-03-15 ENCOUNTER — Ambulatory Visit: Payer: No Typology Code available for payment source | Admitting: Physical Medicine & Rehabilitation

## 2019-03-22 ENCOUNTER — Ambulatory Visit: Payer: Medicare Other | Admitting: Neurology

## 2019-06-12 IMAGING — DX DG CHEST 2V
2 series · 2 of 2 positions shown · non-contrast
Comparison: March 04, 2016

CLINICAL DATA: Chest pain on the right.  Cough.

EXAM:
CHEST - 2 VIEW

[chest pa]
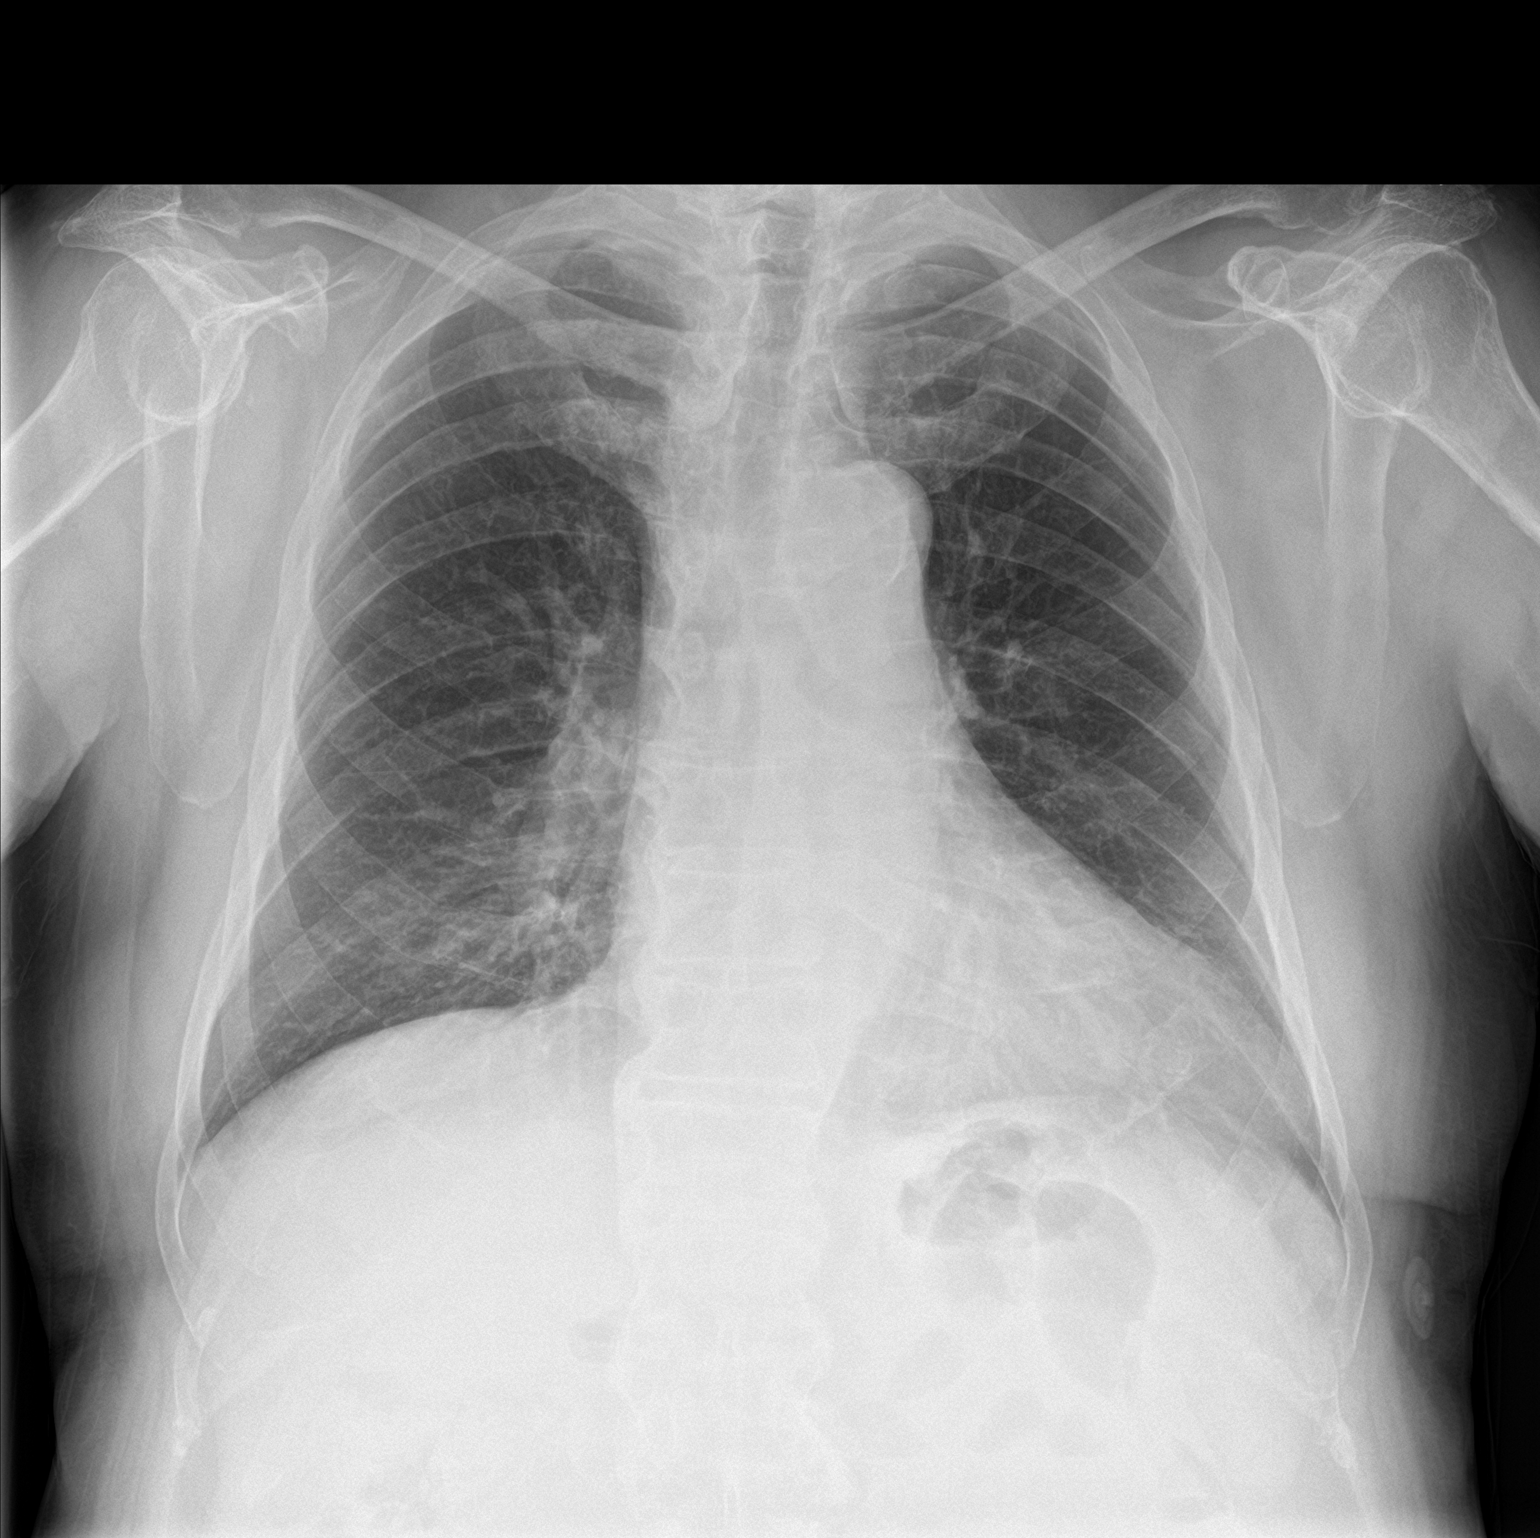

[chest lat]
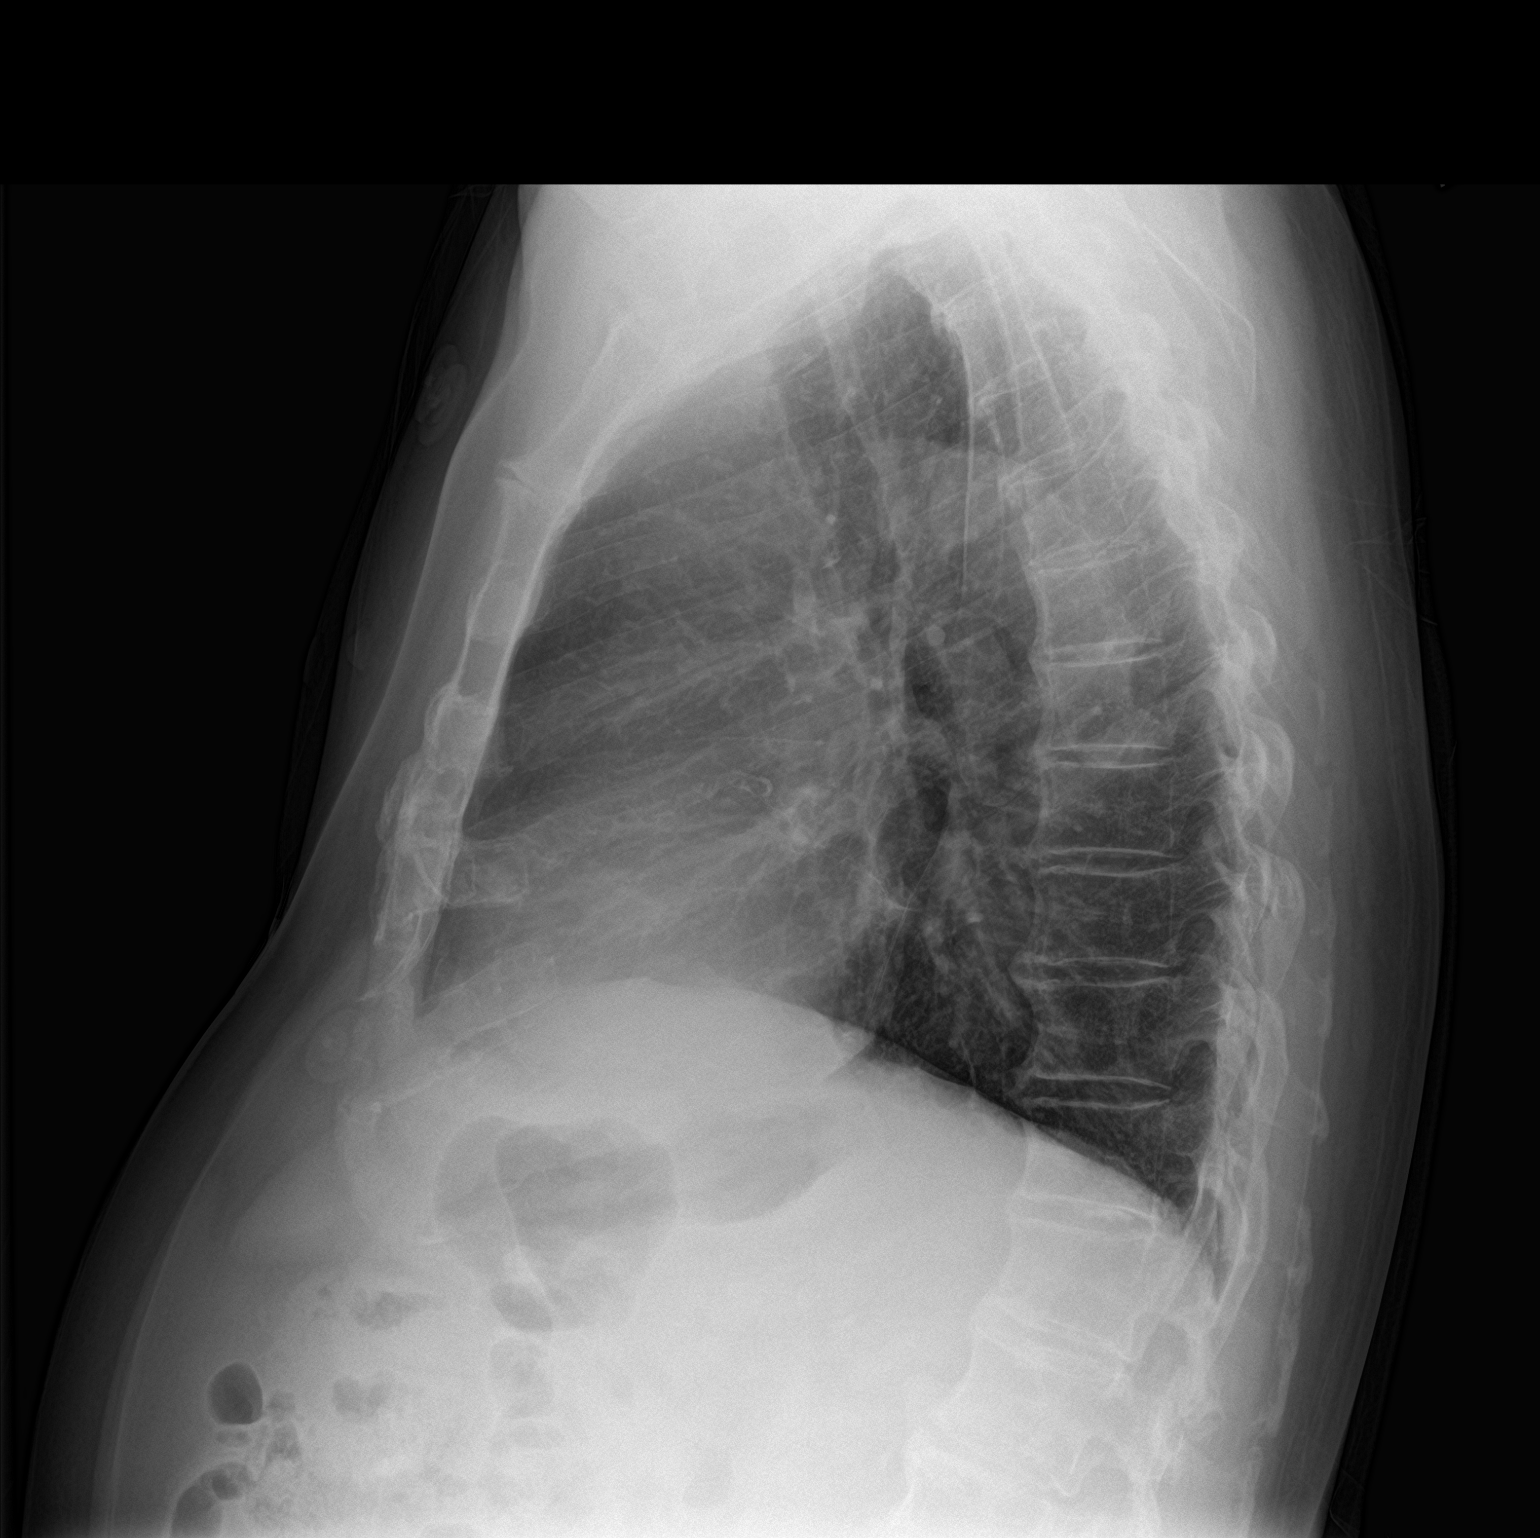

[2 of 2 positions shown; findings below may reference images not displayed]

FINDINGS: No pneumothorax. The heart, hila, and mediastinum are normal. Mild
vascular crowding in the medial right lung base. No nodules or
masses. No focal infiltrates. No overt edema. No acute abnormality.
IMPRESSION: No active cardiopulmonary disease.

## 2020-02-05 IMAGING — CR DG FEMUR 2+V*L*
4 series · 4 of 4 positions shown · non-contrast
Comparison: None

CLINICAL DATA: Fell at home 5 days ago when LEFT leg gave out, LEFT
anterior femur pain, multiple recent falls

EXAM:
LEFT FEMUR 2 VIEWS

[femur ap (1 of 2)]
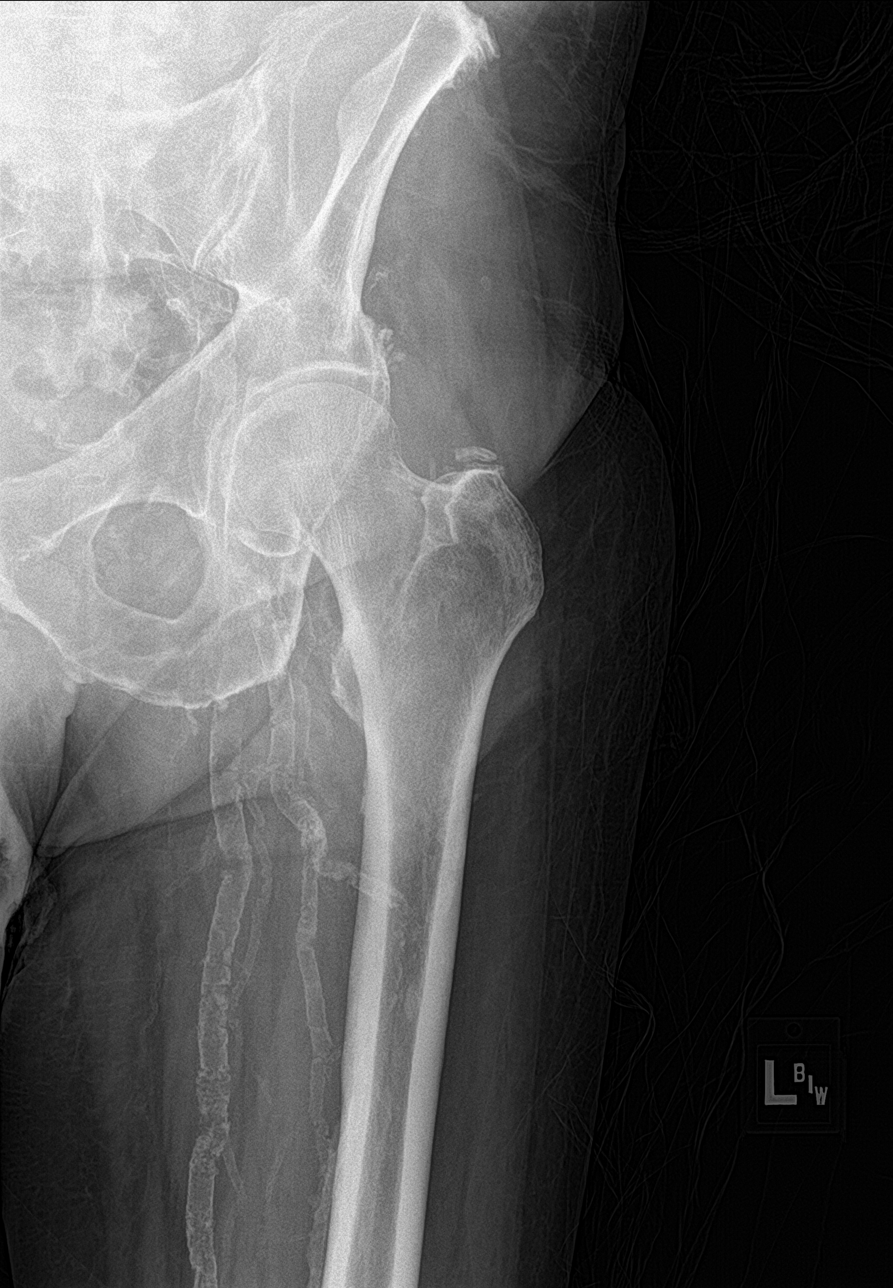

[femur ap (2 of 2)]
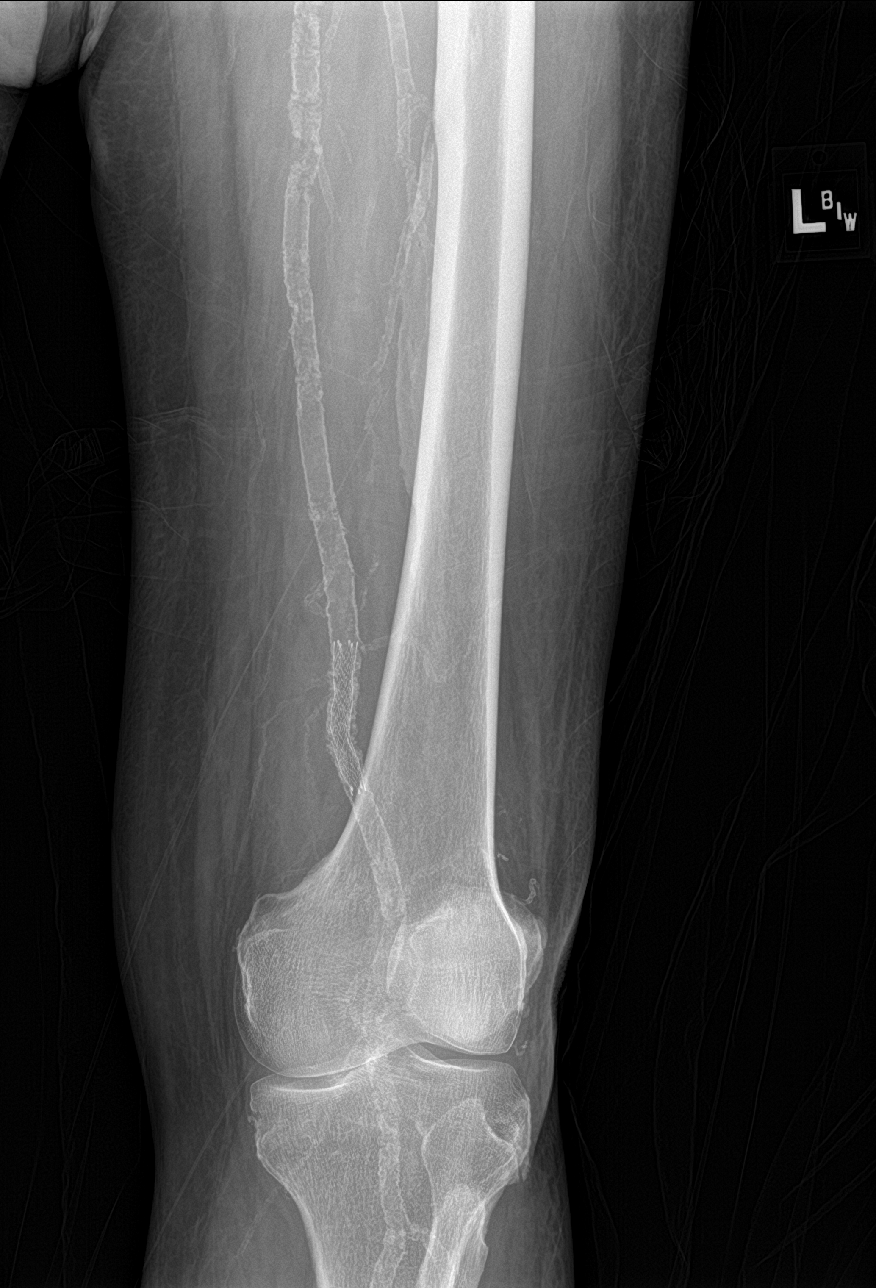

[femur lat (1 of 2)]
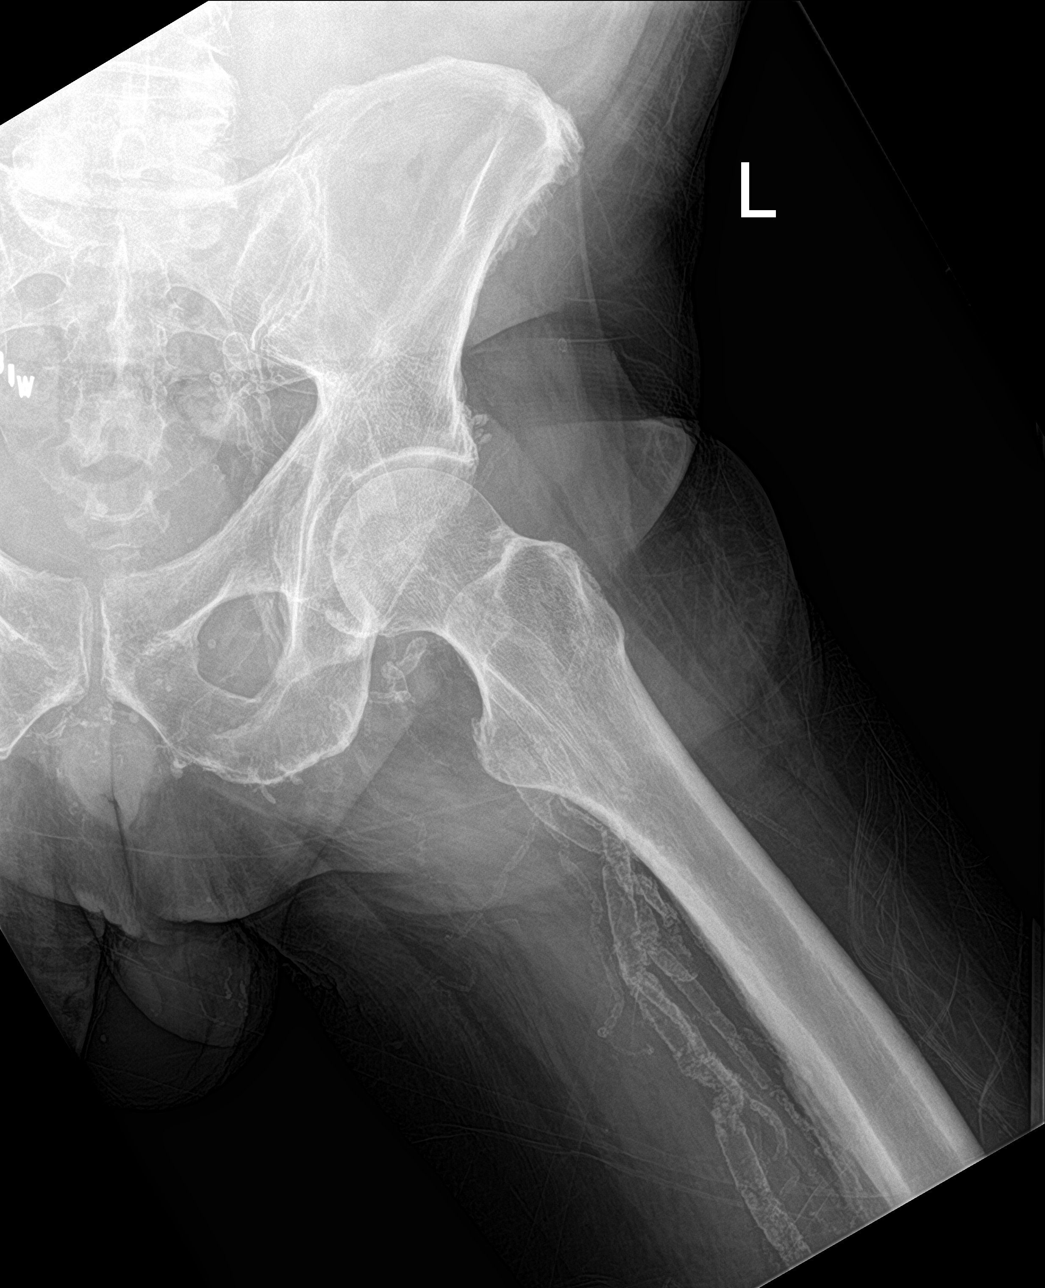

[femur lat (2 of 2)]
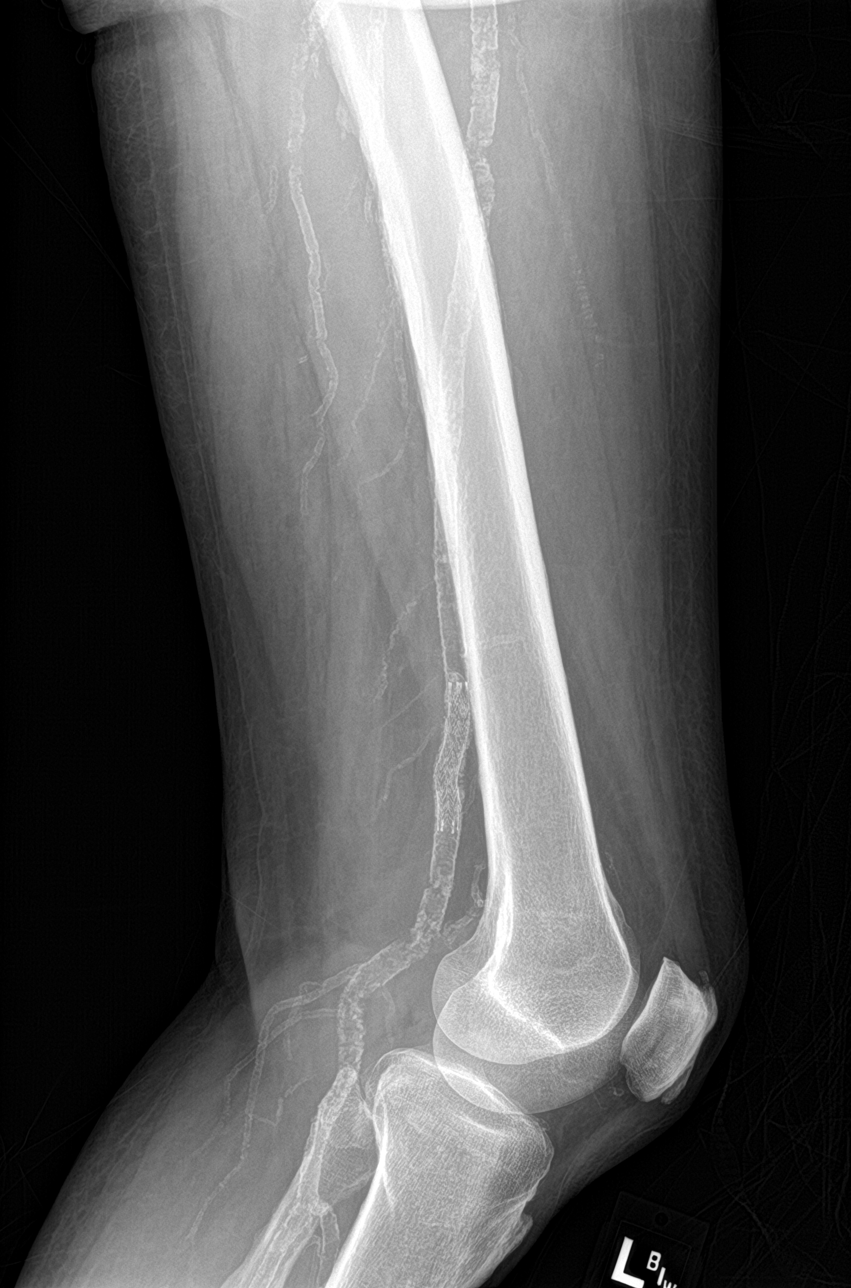

[4 of 4 positions shown; findings below may reference images not displayed]

FINDINGS: Osseous mineralization diffusely decreased.

Hip and knee joint alignment normal.

Mild joint space narrowing at LEFT knee identified.

No acute fracture, dislocation, or bone destruction.

Visualized LEFT pelvis intact.

Extensive atherosclerotic calcifications in pelvis and LEFT leg.

Stent identified at distal superficial femoral artery.
IMPRESSION: No acute osseous abnormalities.

Extensive atherosclerotic calcification.
